# Patient Record
Sex: Female | Born: 1950 | Race: Black or African American | Hispanic: No | Smoking: Former smoker
Health system: Southern US, Community
[De-identification: ages and names within clinical notes are randomized; demographics above are authoritative.]

## PROBLEM LIST (undated history)

## (undated) DIAGNOSIS — I4729 Other ventricular tachycardia: Secondary | ICD-10-CM

## (undated) DIAGNOSIS — I10 Essential (primary) hypertension: Secondary | ICD-10-CM

## (undated) DIAGNOSIS — K7689 Other specified diseases of liver: Secondary | ICD-10-CM

## (undated) DIAGNOSIS — K219 Gastro-esophageal reflux disease without esophagitis: Secondary | ICD-10-CM

## (undated) DIAGNOSIS — J479 Bronchiectasis, uncomplicated: Secondary | ICD-10-CM

## (undated) DIAGNOSIS — Z923 Personal history of irradiation: Secondary | ICD-10-CM

## (undated) DIAGNOSIS — G459 Transient cerebral ischemic attack, unspecified: Secondary | ICD-10-CM

## (undated) DIAGNOSIS — Z95810 Presence of automatic (implantable) cardiac defibrillator: Secondary | ICD-10-CM

## (undated) DIAGNOSIS — G4733 Obstructive sleep apnea (adult) (pediatric): Secondary | ICD-10-CM

## (undated) DIAGNOSIS — C50919 Malignant neoplasm of unspecified site of unspecified female breast: Secondary | ICD-10-CM

## (undated) DIAGNOSIS — R112 Nausea with vomiting, unspecified: Secondary | ICD-10-CM

## (undated) DIAGNOSIS — D869 Sarcoidosis, unspecified: Secondary | ICD-10-CM

## (undated) DIAGNOSIS — J189 Pneumonia, unspecified organism: Secondary | ICD-10-CM

## (undated) DIAGNOSIS — Z950 Presence of cardiac pacemaker: Secondary | ICD-10-CM

## (undated) DIAGNOSIS — K222 Esophageal obstruction: Secondary | ICD-10-CM

## (undated) DIAGNOSIS — I5181 Takotsubo syndrome: Secondary | ICD-10-CM

## (undated) DIAGNOSIS — M2550 Pain in unspecified joint: Secondary | ICD-10-CM

## (undated) DIAGNOSIS — T7840XA Allergy, unspecified, initial encounter: Secondary | ICD-10-CM

## (undated) DIAGNOSIS — Z9889 Other specified postprocedural states: Secondary | ICD-10-CM

## (undated) DIAGNOSIS — K279 Peptic ulcer, site unspecified, unspecified as acute or chronic, without hemorrhage or perforation: Secondary | ICD-10-CM

## (undated) DIAGNOSIS — I509 Heart failure, unspecified: Secondary | ICD-10-CM

## (undated) DIAGNOSIS — M797 Fibromyalgia: Secondary | ICD-10-CM

## (undated) DIAGNOSIS — K648 Other hemorrhoids: Secondary | ICD-10-CM

## (undated) DIAGNOSIS — K5730 Diverticulosis of large intestine without perforation or abscess without bleeding: Secondary | ICD-10-CM

## (undated) DIAGNOSIS — I2699 Other pulmonary embolism without acute cor pulmonale: Secondary | ICD-10-CM

## (undated) DIAGNOSIS — K760 Fatty (change of) liver, not elsewhere classified: Secondary | ICD-10-CM

## (undated) DIAGNOSIS — A498 Other bacterial infections of unspecified site: Secondary | ICD-10-CM

## (undated) DIAGNOSIS — IMO0001 Reserved for inherently not codable concepts without codable children: Secondary | ICD-10-CM

## (undated) DIAGNOSIS — R0602 Shortness of breath: Secondary | ICD-10-CM

## (undated) DIAGNOSIS — R002 Palpitations: Secondary | ICD-10-CM

## (undated) DIAGNOSIS — I671 Cerebral aneurysm, nonruptured: Secondary | ICD-10-CM

## (undated) DIAGNOSIS — M549 Dorsalgia, unspecified: Secondary | ICD-10-CM

## (undated) DIAGNOSIS — K2970 Gastritis, unspecified, without bleeding: Secondary | ICD-10-CM

## (undated) DIAGNOSIS — F3289 Other specified depressive episodes: Secondary | ICD-10-CM

## (undated) DIAGNOSIS — F329 Major depressive disorder, single episode, unspecified: Secondary | ICD-10-CM

## (undated) DIAGNOSIS — M1990 Unspecified osteoarthritis, unspecified site: Secondary | ICD-10-CM

## (undated) DIAGNOSIS — E785 Hyperlipidemia, unspecified: Secondary | ICD-10-CM

## (undated) DIAGNOSIS — Q211 Atrial septal defect: Secondary | ICD-10-CM

## (undated) DIAGNOSIS — E039 Hypothyroidism, unspecified: Secondary | ICD-10-CM

## (undated) DIAGNOSIS — H409 Unspecified glaucoma: Secondary | ICD-10-CM

## (undated) DIAGNOSIS — Q2111 Secundum atrial septal defect: Secondary | ICD-10-CM

## (undated) DIAGNOSIS — E559 Vitamin D deficiency, unspecified: Secondary | ICD-10-CM

## (undated) DIAGNOSIS — I639 Cerebral infarction, unspecified: Secondary | ICD-10-CM

## (undated) DIAGNOSIS — K922 Gastrointestinal hemorrhage, unspecified: Secondary | ICD-10-CM

## (undated) DIAGNOSIS — I472 Ventricular tachycardia, unspecified: Secondary | ICD-10-CM

## (undated) DIAGNOSIS — G4730 Sleep apnea, unspecified: Secondary | ICD-10-CM

## (undated) DIAGNOSIS — F419 Anxiety disorder, unspecified: Secondary | ICD-10-CM

## (undated) DIAGNOSIS — IMO0002 Reserved for concepts with insufficient information to code with codable children: Secondary | ICD-10-CM

## (undated) DIAGNOSIS — D126 Benign neoplasm of colon, unspecified: Secondary | ICD-10-CM

## (undated) DIAGNOSIS — K449 Diaphragmatic hernia without obstruction or gangrene: Secondary | ICD-10-CM

## (undated) HISTORY — DX: Other bacterial infections of unspecified site: A49.8

## (undated) HISTORY — DX: Malignant neoplasm of unspecified site of unspecified female breast: C50.919

## (undated) HISTORY — DX: Palpitations: R00.2

## (undated) HISTORY — PX: ABDOMINAL HYSTERECTOMY: SHX81

## (undated) HISTORY — DX: Takotsubo syndrome: I51.81

## (undated) HISTORY — PX: CARDIAC DEFIBRILLATOR PLACEMENT: SHX171

## (undated) HISTORY — DX: Essential (primary) hypertension: I10

## (undated) HISTORY — DX: Transient cerebral ischemic attack, unspecified: G45.9

## (undated) HISTORY — DX: Obstructive sleep apnea (adult) (pediatric): G47.33

## (undated) HISTORY — DX: Other specified diseases of liver: K76.89

## (undated) HISTORY — DX: Reserved for inherently not codable concepts without codable children: IMO0001

## (undated) HISTORY — DX: Bronchiectasis, uncomplicated: J47.9

## (undated) HISTORY — DX: Esophageal obstruction: K22.2

## (undated) HISTORY — PX: JOINT REPLACEMENT: SHX530

## (undated) HISTORY — DX: Reserved for concepts with insufficient information to code with codable children: IMO0002

## (undated) HISTORY — DX: Other hemorrhoids: K64.8

## (undated) HISTORY — DX: Pain in unspecified joint: M25.50

## (undated) HISTORY — DX: Ventricular tachycardia, unspecified: I47.20

## (undated) HISTORY — DX: Shortness of breath: R06.02

## (undated) HISTORY — DX: Atrial septal defect: Q21.1

## (undated) HISTORY — DX: Peptic ulcer, site unspecified, unspecified as acute or chronic, without hemorrhage or perforation: K27.9

## (undated) HISTORY — DX: Anxiety disorder, unspecified: F41.9

## (undated) HISTORY — DX: Hyperlipidemia, unspecified: E78.5

## (undated) HISTORY — DX: Other ventricular tachycardia: I47.29

## (undated) HISTORY — DX: Cerebral aneurysm, nonruptured: I67.1

## (undated) HISTORY — DX: Gastro-esophageal reflux disease without esophagitis: K21.9

## (undated) HISTORY — DX: Diverticulosis of large intestine without perforation or abscess without bleeding: K57.30

## (undated) HISTORY — DX: Allergy, unspecified, initial encounter: T78.40XA

## (undated) HISTORY — PX: COLONOSCOPY: SHX174

## (undated) HISTORY — DX: Sarcoidosis, unspecified: D86.9

## (undated) HISTORY — DX: Gastrointestinal hemorrhage, unspecified: K92.2

## (undated) HISTORY — DX: Sleep apnea, unspecified: G47.30

## (undated) HISTORY — PX: EYE SURGERY: SHX253

## (undated) HISTORY — PX: UPPER GASTROINTESTINAL ENDOSCOPY: SHX188

## (undated) HISTORY — DX: Major depressive disorder, single episode, unspecified: F32.9

## (undated) HISTORY — DX: Other pulmonary embolism without acute cor pulmonale: I26.99

## (undated) HISTORY — PX: KNEE ARTHROSCOPY: SUR90

## (undated) HISTORY — PX: BREAST EXCISIONAL BIOPSY: SUR124

## (undated) HISTORY — PX: TUBAL LIGATION: SHX77

## (undated) HISTORY — DX: Vitamin D deficiency, unspecified: E55.9

## (undated) HISTORY — DX: Secundum atrial septal defect: Q21.11

## (undated) HISTORY — PX: PARTIAL HYSTERECTOMY: SHX80

## (undated) HISTORY — PX: BREAST LUMPECTOMY: SHX2

## (undated) HISTORY — DX: Unspecified osteoarthritis, unspecified site: M19.90

## (undated) HISTORY — DX: Diaphragmatic hernia without obstruction or gangrene: K44.9

## (undated) HISTORY — DX: Unspecified glaucoma: H40.9

## (undated) HISTORY — DX: Ventricular tachycardia: I47.2

## (undated) HISTORY — DX: Dorsalgia, unspecified: M54.9

## (undated) HISTORY — DX: Gastritis, unspecified, without bleeding: K29.70

## (undated) HISTORY — DX: Heart failure, unspecified: I50.9

## (undated) HISTORY — DX: Other specified depressive episodes: F32.89

## (undated) HISTORY — DX: Benign neoplasm of colon, unspecified: D12.6

## (undated) HISTORY — DX: Fatty (change of) liver, not elsewhere classified: K76.0

## (undated) HISTORY — PX: OTHER SURGICAL HISTORY: SHX169

## (undated) HISTORY — DX: Hypothyroidism, unspecified: E03.9

---

## 1998-04-11 ENCOUNTER — Ambulatory Visit (HOSPITAL_COMMUNITY): Admission: RE | Admit: 1998-04-11 | Discharge: 1998-04-11 | Payer: Self-pay | Admitting: *Deleted

## 1998-10-06 ENCOUNTER — Other Ambulatory Visit: Admission: RE | Admit: 1998-10-06 | Discharge: 1998-10-06 | Payer: Self-pay | Admitting: *Deleted

## 1998-11-16 ENCOUNTER — Encounter: Admission: RE | Admit: 1998-11-16 | Discharge: 1999-02-14 | Payer: Self-pay | Admitting: Radiation Oncology

## 1998-11-28 ENCOUNTER — Inpatient Hospital Stay (HOSPITAL_COMMUNITY): Admission: RE | Admit: 1998-11-28 | Discharge: 1998-12-01 | Payer: Self-pay | Admitting: *Deleted

## 1999-01-23 ENCOUNTER — Encounter: Payer: Self-pay | Admitting: Neurosurgery

## 1999-01-23 ENCOUNTER — Ambulatory Visit (HOSPITAL_COMMUNITY): Admission: RE | Admit: 1999-01-23 | Discharge: 1999-01-23 | Payer: Self-pay | Admitting: Neurosurgery

## 1999-02-18 HISTORY — PX: CEREBRAL ANEURYSM REPAIR: SHX164

## 1999-02-28 ENCOUNTER — Inpatient Hospital Stay (HOSPITAL_COMMUNITY): Admission: RE | Admit: 1999-02-28 | Discharge: 1999-03-05 | Payer: Self-pay | Admitting: Neurosurgery

## 1999-02-28 ENCOUNTER — Encounter: Payer: Self-pay | Admitting: Neurosurgery

## 1999-03-02 ENCOUNTER — Encounter: Payer: Self-pay | Admitting: Neurosurgery

## 1999-03-30 ENCOUNTER — Emergency Department (HOSPITAL_COMMUNITY): Admission: EM | Admit: 1999-03-30 | Discharge: 1999-03-30 | Payer: Self-pay | Admitting: Emergency Medicine

## 1999-08-30 ENCOUNTER — Encounter: Admission: RE | Admit: 1999-08-30 | Discharge: 1999-08-30 | Payer: Self-pay | Admitting: *Deleted

## 1999-10-02 ENCOUNTER — Encounter: Payer: Self-pay | Admitting: Neurosurgery

## 1999-10-02 ENCOUNTER — Encounter: Admission: RE | Admit: 1999-10-02 | Discharge: 1999-10-02 | Payer: Self-pay | Admitting: Neurosurgery

## 1999-12-05 ENCOUNTER — Ambulatory Visit (HOSPITAL_BASED_OUTPATIENT_CLINIC_OR_DEPARTMENT_OTHER): Admission: RE | Admit: 1999-12-05 | Discharge: 1999-12-05 | Payer: Self-pay | Admitting: General Surgery

## 2000-06-17 ENCOUNTER — Encounter: Payer: Self-pay | Admitting: Family Medicine

## 2000-06-17 ENCOUNTER — Encounter: Admission: RE | Admit: 2000-06-17 | Discharge: 2000-06-17 | Payer: Self-pay | Admitting: Family Medicine

## 2000-07-29 ENCOUNTER — Ambulatory Visit (HOSPITAL_COMMUNITY): Admission: RE | Admit: 2000-07-29 | Discharge: 2000-07-29 | Payer: Self-pay | Admitting: Neurology

## 2000-07-29 ENCOUNTER — Encounter: Payer: Self-pay | Admitting: Neurology

## 2000-08-30 ENCOUNTER — Encounter: Payer: Self-pay | Admitting: Orthopedic Surgery

## 2000-08-30 ENCOUNTER — Encounter: Admission: RE | Admit: 2000-08-30 | Discharge: 2000-08-30 | Payer: Self-pay | Admitting: Orthopedic Surgery

## 2000-11-22 ENCOUNTER — Ambulatory Visit (HOSPITAL_COMMUNITY): Admission: RE | Admit: 2000-11-22 | Discharge: 2000-11-22 | Payer: Self-pay | Admitting: Family Medicine

## 2000-12-04 ENCOUNTER — Encounter: Payer: Self-pay | Admitting: Family Medicine

## 2000-12-04 ENCOUNTER — Encounter: Admission: RE | Admit: 2000-12-04 | Discharge: 2000-12-04 | Payer: Self-pay | Admitting: Family Medicine

## 2001-08-20 ENCOUNTER — Encounter: Payer: Self-pay | Admitting: Family Medicine

## 2001-11-03 ENCOUNTER — Encounter: Admission: RE | Admit: 2001-11-03 | Discharge: 2001-11-03 | Payer: Self-pay | Admitting: Neurosurgery

## 2001-11-03 ENCOUNTER — Encounter: Payer: Self-pay | Admitting: Neurosurgery

## 2001-12-24 ENCOUNTER — Ambulatory Visit (HOSPITAL_COMMUNITY): Admission: RE | Admit: 2001-12-24 | Discharge: 2001-12-24 | Payer: Self-pay | Admitting: Gastroenterology

## 2001-12-24 ENCOUNTER — Encounter: Payer: Self-pay | Admitting: Gastroenterology

## 2002-02-26 ENCOUNTER — Other Ambulatory Visit: Admission: RE | Admit: 2002-02-26 | Discharge: 2002-02-26 | Payer: Self-pay | Admitting: Obstetrics and Gynecology

## 2002-05-18 ENCOUNTER — Encounter: Payer: Self-pay | Admitting: Neurology

## 2002-05-18 ENCOUNTER — Encounter: Admission: RE | Admit: 2002-05-18 | Discharge: 2002-05-18 | Payer: Self-pay | Admitting: Neurology

## 2002-08-20 DIAGNOSIS — K922 Gastrointestinal hemorrhage, unspecified: Secondary | ICD-10-CM

## 2002-08-20 HISTORY — PX: OTHER SURGICAL HISTORY: SHX169

## 2002-08-20 HISTORY — DX: Gastrointestinal hemorrhage, unspecified: K92.2

## 2002-11-03 ENCOUNTER — Ambulatory Visit (HOSPITAL_COMMUNITY): Admission: RE | Admit: 2002-11-03 | Discharge: 2002-11-03 | Payer: Self-pay | Admitting: Neurosurgery

## 2003-03-30 ENCOUNTER — Inpatient Hospital Stay (HOSPITAL_COMMUNITY): Admission: EM | Admit: 2003-03-30 | Discharge: 2003-03-31 | Payer: Self-pay | Admitting: Emergency Medicine

## 2003-04-23 ENCOUNTER — Encounter: Admission: RE | Admit: 2003-04-23 | Discharge: 2003-04-23 | Payer: Self-pay | Admitting: *Deleted

## 2003-06-01 ENCOUNTER — Encounter: Payer: Self-pay | Admitting: Gastroenterology

## 2003-06-01 ENCOUNTER — Encounter: Admission: RE | Admit: 2003-06-01 | Discharge: 2003-06-01 | Payer: Self-pay | Admitting: Gastroenterology

## 2003-06-21 ENCOUNTER — Encounter: Admission: RE | Admit: 2003-06-21 | Discharge: 2003-06-21 | Payer: Self-pay | Admitting: Neurosurgery

## 2003-11-20 ENCOUNTER — Inpatient Hospital Stay (HOSPITAL_COMMUNITY): Admission: EM | Admit: 2003-11-20 | Discharge: 2003-11-22 | Payer: Self-pay | Admitting: Emergency Medicine

## 2003-11-22 ENCOUNTER — Encounter (INDEPENDENT_AMBULATORY_CARE_PROVIDER_SITE_OTHER): Payer: Self-pay | Admitting: Cardiology

## 2003-11-22 ENCOUNTER — Encounter: Payer: Self-pay | Admitting: Family Medicine

## 2003-11-26 ENCOUNTER — Encounter: Admission: RE | Admit: 2003-11-26 | Discharge: 2003-11-26 | Payer: Self-pay | Admitting: Family Medicine

## 2004-01-14 ENCOUNTER — Inpatient Hospital Stay (HOSPITAL_COMMUNITY): Admission: EM | Admit: 2004-01-14 | Discharge: 2004-01-22 | Payer: Self-pay | Admitting: *Deleted

## 2004-01-17 ENCOUNTER — Encounter: Payer: Self-pay | Admitting: Cardiology

## 2004-01-22 ENCOUNTER — Encounter: Payer: Self-pay | Admitting: Family Medicine

## 2004-02-22 ENCOUNTER — Ambulatory Visit (HOSPITAL_COMMUNITY): Admission: RE | Admit: 2004-02-22 | Discharge: 2004-02-22 | Payer: Self-pay | Admitting: Cardiology

## 2004-04-28 ENCOUNTER — Encounter: Payer: Self-pay | Admitting: Pulmonary Disease

## 2004-05-11 ENCOUNTER — Ambulatory Visit (HOSPITAL_COMMUNITY): Admission: RE | Admit: 2004-05-11 | Discharge: 2004-05-11 | Payer: Self-pay | Admitting: Pulmonary Disease

## 2004-05-16 ENCOUNTER — Encounter: Admission: RE | Admit: 2004-05-16 | Discharge: 2004-05-16 | Payer: Self-pay | Admitting: *Deleted

## 2004-06-27 ENCOUNTER — Ambulatory Visit: Payer: Self-pay | Admitting: Cardiology

## 2004-07-05 ENCOUNTER — Ambulatory Visit: Payer: Self-pay | Admitting: Family Medicine

## 2004-07-19 ENCOUNTER — Ambulatory Visit: Payer: Self-pay | Admitting: Internal Medicine

## 2004-08-01 ENCOUNTER — Ambulatory Visit: Payer: Self-pay | Admitting: Family Medicine

## 2004-08-04 ENCOUNTER — Ambulatory Visit: Payer: Self-pay | Admitting: Cardiology

## 2004-08-07 ENCOUNTER — Ambulatory Visit: Payer: Self-pay | Admitting: Cardiology

## 2004-08-20 HISTORY — PX: OTHER SURGICAL HISTORY: SHX169

## 2004-09-27 ENCOUNTER — Ambulatory Visit: Payer: Self-pay | Admitting: Internal Medicine

## 2004-10-06 ENCOUNTER — Ambulatory Visit: Payer: Self-pay | Admitting: Internal Medicine

## 2004-10-23 ENCOUNTER — Ambulatory Visit: Payer: Self-pay | Admitting: Internal Medicine

## 2004-10-23 ENCOUNTER — Ambulatory Visit: Payer: Self-pay | Admitting: Cardiology

## 2004-11-06 ENCOUNTER — Ambulatory Visit: Payer: Self-pay | Admitting: Pulmonary Disease

## 2004-11-08 ENCOUNTER — Ambulatory Visit (HOSPITAL_COMMUNITY): Admission: RE | Admit: 2004-11-08 | Discharge: 2004-11-08 | Payer: Self-pay | Admitting: Pulmonary Disease

## 2004-11-16 ENCOUNTER — Ambulatory Visit: Payer: Self-pay | Admitting: Pulmonary Disease

## 2004-11-16 ENCOUNTER — Ambulatory Visit: Admission: RE | Admit: 2004-11-16 | Discharge: 2004-11-16 | Payer: Self-pay | Admitting: Pulmonary Disease

## 2004-11-20 ENCOUNTER — Ambulatory Visit: Payer: Self-pay | Admitting: Cardiology

## 2004-11-27 ENCOUNTER — Ambulatory Visit: Payer: Self-pay | Admitting: Cardiology

## 2004-12-08 ENCOUNTER — Ambulatory Visit: Payer: Self-pay

## 2004-12-11 ENCOUNTER — Ambulatory Visit: Payer: Self-pay | Admitting: Family Medicine

## 2004-12-18 ENCOUNTER — Ambulatory Visit: Payer: Self-pay | Admitting: Cardiology

## 2004-12-19 ENCOUNTER — Ambulatory Visit: Payer: Self-pay | Admitting: Family Medicine

## 2004-12-28 ENCOUNTER — Ambulatory Visit: Payer: Self-pay | Admitting: Internal Medicine

## 2004-12-28 ENCOUNTER — Ambulatory Visit (HOSPITAL_COMMUNITY): Admission: RE | Admit: 2004-12-28 | Discharge: 2004-12-28 | Payer: Self-pay | Admitting: Cardiology

## 2005-01-01 ENCOUNTER — Ambulatory Visit: Payer: Self-pay | Admitting: Internal Medicine

## 2005-01-01 ENCOUNTER — Ambulatory Visit: Payer: Self-pay | Admitting: Cardiology

## 2005-01-11 ENCOUNTER — Ambulatory Visit: Payer: Self-pay | Admitting: Family Medicine

## 2005-01-17 ENCOUNTER — Ambulatory Visit: Payer: Self-pay | Admitting: Family Medicine

## 2005-01-22 ENCOUNTER — Ambulatory Visit: Payer: Self-pay | Admitting: Cardiology

## 2005-01-22 ENCOUNTER — Ambulatory Visit: Payer: Self-pay | Admitting: Internal Medicine

## 2005-02-05 ENCOUNTER — Encounter: Admission: RE | Admit: 2005-02-05 | Discharge: 2005-02-05 | Payer: Self-pay | Admitting: Gastroenterology

## 2005-02-05 ENCOUNTER — Ambulatory Visit: Payer: Self-pay | Admitting: Cardiology

## 2005-02-23 ENCOUNTER — Ambulatory Visit: Payer: Self-pay | Admitting: Cardiology

## 2005-03-06 ENCOUNTER — Ambulatory Visit: Payer: Self-pay | Admitting: Family Medicine

## 2005-03-26 ENCOUNTER — Ambulatory Visit: Payer: Self-pay | Admitting: Family Medicine

## 2005-03-26 ENCOUNTER — Ambulatory Visit: Payer: Self-pay | Admitting: Cardiology

## 2005-04-13 ENCOUNTER — Ambulatory Visit: Payer: Self-pay | Admitting: Internal Medicine

## 2005-04-13 ENCOUNTER — Ambulatory Visit: Payer: Self-pay | Admitting: Cardiology

## 2005-05-04 ENCOUNTER — Ambulatory Visit: Payer: Self-pay | Admitting: Cardiology

## 2005-05-11 ENCOUNTER — Ambulatory Visit: Payer: Self-pay | Admitting: Licensed Clinical Social Worker

## 2005-05-16 ENCOUNTER — Ambulatory Visit: Payer: Self-pay | Admitting: Licensed Clinical Social Worker

## 2005-05-24 ENCOUNTER — Ambulatory Visit: Payer: Self-pay | Admitting: Licensed Clinical Social Worker

## 2005-05-29 ENCOUNTER — Encounter: Admission: RE | Admit: 2005-05-29 | Discharge: 2005-05-29 | Payer: Self-pay | Admitting: *Deleted

## 2005-05-31 ENCOUNTER — Ambulatory Visit: Payer: Self-pay | Admitting: Licensed Clinical Social Worker

## 2005-06-01 ENCOUNTER — Ambulatory Visit: Payer: Self-pay | Admitting: Internal Medicine

## 2005-06-07 ENCOUNTER — Ambulatory Visit: Payer: Self-pay | Admitting: Licensed Clinical Social Worker

## 2005-06-14 ENCOUNTER — Ambulatory Visit: Payer: Self-pay | Admitting: Licensed Clinical Social Worker

## 2005-06-21 ENCOUNTER — Ambulatory Visit: Payer: Self-pay | Admitting: Family Medicine

## 2005-06-21 ENCOUNTER — Ambulatory Visit: Payer: Self-pay | Admitting: Licensed Clinical Social Worker

## 2005-06-28 ENCOUNTER — Ambulatory Visit: Payer: Self-pay | Admitting: Cardiology

## 2005-07-03 ENCOUNTER — Ambulatory Visit: Payer: Self-pay | Admitting: Family Medicine

## 2005-07-17 ENCOUNTER — Ambulatory Visit: Payer: Self-pay

## 2005-07-17 ENCOUNTER — Ambulatory Visit: Payer: Self-pay | Admitting: *Deleted

## 2005-07-18 ENCOUNTER — Ambulatory Visit: Payer: Self-pay | Admitting: Family Medicine

## 2005-07-24 ENCOUNTER — Ambulatory Visit: Payer: Self-pay | Admitting: Family Medicine

## 2005-07-26 ENCOUNTER — Ambulatory Visit: Payer: Self-pay | Admitting: Cardiology

## 2005-08-07 ENCOUNTER — Ambulatory Visit: Payer: Self-pay | Admitting: Cardiovascular Disease

## 2005-08-28 ENCOUNTER — Ambulatory Visit: Payer: Self-pay | Admitting: Cardiology

## 2005-09-12 ENCOUNTER — Ambulatory Visit: Payer: Self-pay | Admitting: *Deleted

## 2005-09-27 ENCOUNTER — Ambulatory Visit: Payer: Self-pay | Admitting: Cardiology

## 2005-09-28 ENCOUNTER — Ambulatory Visit: Payer: Self-pay | Admitting: Internal Medicine

## 2005-10-02 ENCOUNTER — Ambulatory Visit: Payer: Self-pay | Admitting: *Deleted

## 2005-10-12 ENCOUNTER — Ambulatory Visit: Payer: Self-pay | Admitting: Family Medicine

## 2005-10-15 ENCOUNTER — Encounter: Admission: RE | Admit: 2005-10-15 | Discharge: 2005-10-15 | Payer: Self-pay | Admitting: Family Medicine

## 2005-10-29 ENCOUNTER — Ambulatory Visit: Payer: Self-pay

## 2005-11-02 ENCOUNTER — Ambulatory Visit: Payer: Self-pay | Admitting: Family Medicine

## 2005-11-20 ENCOUNTER — Ambulatory Visit: Payer: Self-pay

## 2005-11-27 ENCOUNTER — Ambulatory Visit: Payer: Self-pay | Admitting: Cardiology

## 2005-12-18 ENCOUNTER — Ambulatory Visit: Payer: Self-pay | Admitting: Family Medicine

## 2005-12-27 ENCOUNTER — Ambulatory Visit: Payer: Self-pay | Admitting: Cardiology

## 2006-01-24 ENCOUNTER — Ambulatory Visit: Payer: Self-pay | Admitting: Internal Medicine

## 2006-02-27 ENCOUNTER — Ambulatory Visit: Payer: Self-pay | Admitting: Internal Medicine

## 2006-02-28 ENCOUNTER — Ambulatory Visit: Payer: Self-pay | Admitting: Internal Medicine

## 2006-03-12 ENCOUNTER — Ambulatory Visit: Payer: Self-pay | Admitting: Family Medicine

## 2006-03-12 ENCOUNTER — Ambulatory Visit: Payer: Self-pay | Admitting: Oncology

## 2006-03-27 ENCOUNTER — Ambulatory Visit: Payer: Self-pay | Admitting: Cardiology

## 2006-03-27 LAB — CBC WITH DIFFERENTIAL (CANCER CENTER ONLY)
BASO#: 0 10*3/uL (ref 0.0–0.2)
Eosinophils Absolute: 0.2 10*3/uL (ref 0.0–0.5)
HCT: 37 % (ref 34.8–46.6)
HGB: 12.1 g/dL (ref 11.6–15.9)
LYMPH#: 0.8 10*3/uL — ABNORMAL LOW (ref 0.9–3.3)
MCH: 27.9 pg (ref 26.0–34.0)
NEUT#: 1.7 10*3/uL (ref 1.5–6.5)
NEUT%: 57.8 % (ref 39.6–80.0)
RBC: 4.33 10*6/uL (ref 3.70–5.32)

## 2006-04-02 LAB — HYPERCOAGULABLE PANEL, COMPREHENSIVE
AntiThromb III Func: 118 % (ref 75–120)
Anticardiolipin IgA: <7 [APL'U] (ref <13)
Beta-2-Glycoprotein I IgA: <4 U/mL (ref <10)
DRVVT 1:1 Mix: 38.7 secs (ref 31.9–44.2)
DRVVT: 62.9 secs — ABNORMAL HIGH (ref 31.9–44.2)
Homocysteine: 8.1 umol/L (ref 4.0–15.4)
PTTLA Confirmation: 0 secs (ref <8.0)
Protein C Activity: <15 % (ref 91–147)
Protein S Activity: 26 % — ABNORMAL LOW (ref 81–180)

## 2006-04-02 LAB — COMPREHENSIVE METABOLIC PANEL
ALT: 34 U/L (ref 0–40)
AST: 34 U/L (ref 0–37)
Albumin: 3.9 g/dL (ref 3.5–5.2)
Alkaline Phosphatase: 107 U/L (ref 39–117)
Potassium: 3.8 mEq/L (ref 3.5–5.3)
Sodium: 140 mEq/L (ref 135–145)
Total Bilirubin: 0.5 mg/dL (ref 0.3–1.2)
Total Protein: 7.6 g/dL (ref 6.0–8.3)

## 2006-04-02 LAB — RETICULOCYTES (CHCC)
ABS Retic: 56 10*3/uL (ref 19.0–186.0)
RBC.: 4.31 MIL/uL (ref 3.87–5.11)

## 2006-04-30 ENCOUNTER — Ambulatory Visit: Payer: Self-pay | Admitting: Oncology

## 2006-05-02 ENCOUNTER — Ambulatory Visit: Payer: Self-pay | Admitting: Cardiology

## 2006-05-02 LAB — CBC WITH DIFFERENTIAL (CANCER CENTER ONLY)
BASO#: 0 10*3/uL (ref 0.0–0.2)
Eosinophils Absolute: 0.1 10*3/uL (ref 0.0–0.5)
HGB: 11.9 g/dL (ref 11.6–15.9)
LYMPH%: 26.4 % (ref 14.0–48.0)
MCH: 27.9 pg (ref 26.0–34.0)
MCV: 86 fL (ref 81–101)
MONO%: 9.7 % (ref 0.0–13.0)
NEUT%: 60.1 % (ref 39.6–80.0)
RBC: 4.27 10*6/uL (ref 3.70–5.32)

## 2006-05-06 ENCOUNTER — Ambulatory Visit: Payer: Self-pay | Admitting: Cardiology

## 2006-05-09 ENCOUNTER — Encounter: Payer: Self-pay | Admitting: Pulmonary Disease

## 2006-05-09 ENCOUNTER — Ambulatory Visit: Payer: Self-pay | Admitting: Cardiology

## 2006-05-14 ENCOUNTER — Ambulatory Visit: Payer: Self-pay | Admitting: Pulmonary Disease

## 2006-05-14 ENCOUNTER — Encounter: Payer: Self-pay | Admitting: Cardiovascular Disease

## 2006-05-14 ENCOUNTER — Ambulatory Visit: Payer: Self-pay

## 2006-05-23 ENCOUNTER — Ambulatory Visit: Payer: Self-pay | Admitting: Cardiology

## 2006-05-28 ENCOUNTER — Ambulatory Visit: Payer: Self-pay | Admitting: Family Medicine

## 2006-05-30 ENCOUNTER — Ambulatory Visit: Payer: Self-pay | Admitting: Cardiology

## 2006-06-20 ENCOUNTER — Encounter: Admission: RE | Admit: 2006-06-20 | Discharge: 2006-06-20 | Payer: Self-pay | Admitting: *Deleted

## 2006-06-20 ENCOUNTER — Ambulatory Visit: Payer: Self-pay | Admitting: *Deleted

## 2006-07-02 ENCOUNTER — Ambulatory Visit: Payer: Self-pay | Admitting: Family Medicine

## 2006-07-08 ENCOUNTER — Ambulatory Visit: Payer: Self-pay | Admitting: Family Medicine

## 2006-08-02 ENCOUNTER — Ambulatory Visit: Payer: Self-pay | Admitting: Internal Medicine

## 2006-08-07 ENCOUNTER — Ambulatory Visit: Payer: Self-pay | Admitting: *Deleted

## 2006-08-15 ENCOUNTER — Ambulatory Visit: Payer: Self-pay | Admitting: Family Medicine

## 2006-09-03 ENCOUNTER — Ambulatory Visit: Payer: Self-pay | Admitting: Family Medicine

## 2006-09-03 LAB — CONVERTED CEMR LAB
ALT: 38 units/L (ref 0–40)
Alkaline Phosphatase: 126 units/L — ABNORMAL HIGH (ref 39–117)
Basophils Absolute: 0 10*3/uL (ref 0.0–0.1)
Basophils Relative: 0.6 % (ref 0.0–1.0)
Lymphocytes Relative: 23.9 % (ref 12.0–46.0)
Monocytes Relative: 10.1 % (ref 3.0–11.0)
Neutro Abs: 1.6 10*3/uL (ref 1.4–7.7)
Platelets: 205 10*3/uL (ref 150–400)
Total Protein: 6.9 g/dL (ref 6.0–8.3)

## 2006-09-04 ENCOUNTER — Ambulatory Visit: Payer: Self-pay | Admitting: Cardiology

## 2006-09-11 ENCOUNTER — Ambulatory Visit: Payer: Self-pay | Admitting: Oncology

## 2006-09-12 ENCOUNTER — Ambulatory Visit: Payer: Self-pay | Admitting: Internal Medicine

## 2006-09-12 LAB — CBC WITH DIFFERENTIAL (CANCER CENTER ONLY)
Eosinophils Absolute: 0.2 10*3/uL (ref 0.0–0.5)
LYMPH%: 27.5 % (ref 14.0–48.0)
MCH: 28.5 pg (ref 26.0–34.0)
MCHC: 33.1 g/dL (ref 32.0–36.0)
MCV: 86 fL (ref 81–101)
MONO%: 8.7 % (ref 0.0–13.0)
Platelets: 190 10*3/uL (ref 145–400)
RDW: 12.6 % (ref 10.5–14.6)

## 2006-09-24 ENCOUNTER — Ambulatory Visit: Payer: Self-pay | Admitting: Cardiology

## 2006-10-02 ENCOUNTER — Ambulatory Visit: Payer: Self-pay | Admitting: *Deleted

## 2006-10-30 ENCOUNTER — Ambulatory Visit: Payer: Self-pay | Admitting: Cardiology

## 2006-11-18 ENCOUNTER — Encounter: Payer: Self-pay | Admitting: Family Medicine

## 2006-11-18 DIAGNOSIS — E785 Hyperlipidemia, unspecified: Secondary | ICD-10-CM

## 2006-11-18 DIAGNOSIS — K219 Gastro-esophageal reflux disease without esophagitis: Secondary | ICD-10-CM | POA: Insufficient documentation

## 2006-11-18 DIAGNOSIS — I10 Essential (primary) hypertension: Secondary | ICD-10-CM | POA: Insufficient documentation

## 2006-11-18 DIAGNOSIS — K760 Fatty (change of) liver, not elsewhere classified: Secondary | ICD-10-CM

## 2006-11-18 DIAGNOSIS — I2699 Other pulmonary embolism without acute cor pulmonale: Secondary | ICD-10-CM | POA: Insufficient documentation

## 2006-11-18 DIAGNOSIS — M797 Fibromyalgia: Secondary | ICD-10-CM

## 2006-11-18 DIAGNOSIS — F418 Other specified anxiety disorders: Secondary | ICD-10-CM

## 2006-11-18 DIAGNOSIS — E039 Hypothyroidism, unspecified: Secondary | ICD-10-CM

## 2006-11-18 DIAGNOSIS — J309 Allergic rhinitis, unspecified: Secondary | ICD-10-CM

## 2006-11-18 DIAGNOSIS — I671 Cerebral aneurysm, nonruptured: Secondary | ICD-10-CM | POA: Insufficient documentation

## 2006-11-18 DIAGNOSIS — K5730 Diverticulosis of large intestine without perforation or abscess without bleeding: Secondary | ICD-10-CM

## 2006-11-18 DIAGNOSIS — I059 Rheumatic mitral valve disease, unspecified: Secondary | ICD-10-CM | POA: Insufficient documentation

## 2006-11-27 ENCOUNTER — Ambulatory Visit: Payer: Self-pay | Admitting: Internal Medicine

## 2006-12-12 ENCOUNTER — Ambulatory Visit: Payer: Self-pay | Admitting: Internal Medicine

## 2006-12-12 ENCOUNTER — Ambulatory Visit: Payer: Self-pay | Admitting: Family Medicine

## 2006-12-25 ENCOUNTER — Ambulatory Visit: Payer: Self-pay | Admitting: *Deleted

## 2006-12-30 ENCOUNTER — Ambulatory Visit: Payer: Self-pay | Admitting: Cardiology

## 2007-01-07 ENCOUNTER — Encounter: Payer: Self-pay | Admitting: Family Medicine

## 2007-01-16 ENCOUNTER — Ambulatory Visit: Payer: Self-pay | Admitting: Internal Medicine

## 2007-01-27 ENCOUNTER — Ambulatory Visit: Payer: Self-pay | Admitting: Internal Medicine

## 2007-01-27 ENCOUNTER — Ambulatory Visit: Payer: Self-pay | Admitting: Cardiology

## 2007-01-27 LAB — CONVERTED CEMR LAB
BUN: 10 mg/dL (ref 6–23)
CO2: 29 meq/L (ref 19–32)
Calcium: 9.1 mg/dL (ref 8.4–10.5)
Chloride: 110 meq/L (ref 96–112)
Creatinine, Ser: 0.8 mg/dL (ref 0.4–1.2)

## 2007-02-26 ENCOUNTER — Ambulatory Visit: Payer: Self-pay | Admitting: Cardiology

## 2007-02-26 LAB — CONVERTED CEMR LAB
CO2: 30 meq/L (ref 19–32)
Calcium: 9.5 mg/dL (ref 8.4–10.5)
Chloride: 106 meq/L (ref 96–112)
Glucose, Bld: 115 mg/dL — ABNORMAL HIGH (ref 70–99)

## 2007-03-11 ENCOUNTER — Ambulatory Visit: Payer: Self-pay | Admitting: Oncology

## 2007-03-13 ENCOUNTER — Encounter: Payer: Self-pay | Admitting: Family Medicine

## 2007-03-13 LAB — CBC WITH DIFFERENTIAL (CANCER CENTER ONLY)
BASO#: 0 10*3/uL (ref 0.0–0.2)
BASO%: 0.4 % (ref 0.0–2.0)
EOS%: 3.8 % (ref 0.0–7.0)
Eosinophils Absolute: 0.1 10*3/uL (ref 0.0–0.5)
HCT: 35.1 % (ref 34.8–46.6)
HGB: 11.7 g/dL (ref 11.6–15.9)
LYMPH#: 1 10*3/uL (ref 0.9–3.3)
LYMPH%: 29 % (ref 14.0–48.0)
MCH: 28.4 pg (ref 26.0–34.0)
MCHC: 33.3 g/dL (ref 32.0–36.0)
MCV: 85 fL (ref 81–101)
MONO#: 0.2 10*3/uL (ref 0.1–0.9)
MONO%: 7.1 % (ref 0.0–13.0)
NEUT#: 2 10*3/uL (ref 1.5–6.5)
NEUT%: 59.7 % (ref 39.6–80.0)
Platelets: 199 10*3/uL (ref 145–400)
RBC: 4.12 10*6/uL (ref 3.70–5.32)
RDW: 12.7 % (ref 10.5–14.6)
WBC: 3.3 10*3/uL — ABNORMAL LOW (ref 3.9–10.0)

## 2007-04-01 ENCOUNTER — Ambulatory Visit: Payer: Self-pay | Admitting: Cardiology

## 2007-04-18 ENCOUNTER — Ambulatory Visit: Payer: Self-pay | Admitting: Family Medicine

## 2007-04-24 ENCOUNTER — Ambulatory Visit: Payer: Self-pay | Admitting: Cardiology

## 2007-05-08 ENCOUNTER — Ambulatory Visit: Payer: Self-pay | Admitting: Cardiology

## 2007-05-12 ENCOUNTER — Encounter: Payer: Self-pay | Admitting: Family Medicine

## 2007-05-13 ENCOUNTER — Encounter: Payer: Self-pay | Admitting: Family Medicine

## 2007-05-15 ENCOUNTER — Ambulatory Visit: Payer: Self-pay | Admitting: Internal Medicine

## 2007-05-22 ENCOUNTER — Ambulatory Visit: Payer: Self-pay | Admitting: Cardiology

## 2007-06-04 ENCOUNTER — Telehealth: Payer: Self-pay | Admitting: Family Medicine

## 2007-06-12 ENCOUNTER — Encounter: Admission: RE | Admit: 2007-06-12 | Discharge: 2007-06-12 | Payer: Self-pay | Admitting: Gastroenterology

## 2007-06-12 ENCOUNTER — Ambulatory Visit: Payer: Self-pay | Admitting: Cardiology

## 2007-06-25 ENCOUNTER — Ambulatory Visit: Payer: Self-pay | Admitting: Cardiology

## 2007-06-26 ENCOUNTER — Ambulatory Visit: Payer: Self-pay

## 2007-06-27 ENCOUNTER — Encounter: Admission: RE | Admit: 2007-06-27 | Discharge: 2007-06-27 | Payer: Self-pay | Admitting: Family Medicine

## 2007-07-01 ENCOUNTER — Encounter (INDEPENDENT_AMBULATORY_CARE_PROVIDER_SITE_OTHER): Payer: Self-pay | Admitting: *Deleted

## 2007-07-03 ENCOUNTER — Ambulatory Visit: Payer: Self-pay | Admitting: Cardiology

## 2007-07-04 ENCOUNTER — Ambulatory Visit: Payer: Self-pay | Admitting: Family Medicine

## 2007-07-09 LAB — CONVERTED CEMR LAB
ALT: 38 units/L — ABNORMAL HIGH (ref 0–35)
Albumin: 4 g/dL (ref 3.5–5.2)
BUN: 11 mg/dL (ref 6–23)
CO2: 30 meq/L (ref 19–32)
GFR calc non Af Amer: 78 mL/min
Phosphorus: 3.8 mg/dL (ref 2.3–4.6)
Potassium: 3.9 meq/L (ref 3.5–5.1)
Sodium: 139 meq/L (ref 135–145)
Total CHOL/HDL Ratio: 4.7
Triglycerides: 127 mg/dL (ref 0–149)

## 2007-07-24 ENCOUNTER — Ambulatory Visit: Payer: Self-pay | Admitting: Cardiovascular Disease

## 2007-08-01 ENCOUNTER — Encounter: Payer: Self-pay | Admitting: Family Medicine

## 2007-08-11 ENCOUNTER — Ambulatory Visit: Payer: Self-pay | Admitting: Cardiology

## 2007-08-14 ENCOUNTER — Ambulatory Visit: Payer: Self-pay | Admitting: Internal Medicine

## 2007-09-12 ENCOUNTER — Telehealth (INDEPENDENT_AMBULATORY_CARE_PROVIDER_SITE_OTHER): Payer: Self-pay | Admitting: *Deleted

## 2007-09-16 ENCOUNTER — Ambulatory Visit: Payer: Self-pay | Admitting: Cardiovascular Disease

## 2007-10-08 ENCOUNTER — Encounter: Payer: Self-pay | Admitting: Family Medicine

## 2007-10-09 ENCOUNTER — Ambulatory Visit: Payer: Self-pay | Admitting: Family Medicine

## 2007-10-10 ENCOUNTER — Encounter: Payer: Self-pay | Admitting: Family Medicine

## 2007-10-13 LAB — CONVERTED CEMR LAB
AST: 36 units/L (ref 0–37)
Cholesterol: 153 mg/dL (ref 0–200)
LDL Cholesterol: 91 mg/dL (ref 0–99)
Total CHOL/HDL Ratio: 3.1
Triglycerides: 68 mg/dL (ref 0–149)

## 2007-10-14 ENCOUNTER — Ambulatory Visit: Payer: Self-pay | Admitting: Cardiology

## 2007-10-17 ENCOUNTER — Ambulatory Visit: Payer: Self-pay | Admitting: Family Medicine

## 2007-10-17 LAB — CONVERTED CEMR LAB
Glucose, Urine, Semiquant: NEGATIVE
Specific Gravity, Urine: 1.01
WBC Urine, dipstick: NEGATIVE
pH: 5.5

## 2007-10-24 ENCOUNTER — Ambulatory Visit: Payer: Self-pay | Admitting: Cardiology

## 2007-11-11 ENCOUNTER — Ambulatory Visit: Payer: Self-pay | Admitting: Cardiovascular Disease

## 2007-11-13 ENCOUNTER — Ambulatory Visit: Payer: Self-pay | Admitting: Internal Medicine

## 2007-11-25 ENCOUNTER — Telehealth (INDEPENDENT_AMBULATORY_CARE_PROVIDER_SITE_OTHER): Payer: Self-pay | Admitting: *Deleted

## 2007-11-25 ENCOUNTER — Ambulatory Visit: Payer: Self-pay | Admitting: Family Medicine

## 2007-11-26 ENCOUNTER — Encounter: Payer: Self-pay | Admitting: Family Medicine

## 2007-12-01 LAB — CONVERTED CEMR LAB
Calcium: 9.4 mg/dL (ref 8.4–10.5)
GFR calc Af Amer: 94 mL/min
Sodium: 139 meq/L (ref 135–145)

## 2007-12-02 LAB — CONVERTED CEMR LAB: Vit D, 1,25-Dihydroxy: 42 (ref 30–89)

## 2007-12-03 ENCOUNTER — Ambulatory Visit: Payer: Self-pay | Admitting: Cardiology

## 2007-12-04 ENCOUNTER — Encounter: Payer: Self-pay | Admitting: Family Medicine

## 2007-12-12 ENCOUNTER — Ambulatory Visit: Payer: Self-pay | Admitting: Cardiology

## 2007-12-31 ENCOUNTER — Ambulatory Visit: Payer: Self-pay | Admitting: Cardiology

## 2008-01-06 ENCOUNTER — Ambulatory Visit: Payer: Self-pay | Admitting: Family Medicine

## 2008-01-13 ENCOUNTER — Encounter: Admission: RE | Admit: 2008-01-13 | Discharge: 2008-01-13 | Payer: Self-pay | Admitting: Neurosurgery

## 2008-01-27 ENCOUNTER — Ambulatory Visit: Payer: Self-pay | Admitting: Internal Medicine

## 2008-01-29 ENCOUNTER — Ambulatory Visit: Payer: Self-pay | Admitting: Cardiology

## 2008-02-16 ENCOUNTER — Ambulatory Visit: Payer: Self-pay | Admitting: Family Medicine

## 2008-02-16 LAB — CONVERTED CEMR LAB
Epithelial cells, urine: 0 /lpf
Glucose, Urine, Semiquant: NEGATIVE
Specific Gravity, Urine: 1.02
WBC Urine, dipstick: NEGATIVE
WBC, UA: 0 cells/hpf
Yeast, UA: 0
pH: 6

## 2008-02-17 ENCOUNTER — Encounter (INDEPENDENT_AMBULATORY_CARE_PROVIDER_SITE_OTHER): Payer: Self-pay | Admitting: *Deleted

## 2008-02-17 ENCOUNTER — Encounter: Payer: Self-pay | Admitting: Family Medicine

## 2008-02-17 ENCOUNTER — Telehealth: Payer: Self-pay | Admitting: Family Medicine

## 2008-02-19 LAB — CONVERTED CEMR LAB: Vit D, 1,25-Dihydroxy: 33 (ref 30–89)

## 2008-02-23 ENCOUNTER — Ambulatory Visit: Payer: Self-pay | Admitting: Internal Medicine

## 2008-02-23 LAB — CONVERTED CEMR LAB
Albumin: 3.9 g/dL (ref 3.5–5.2)
Bilirubin, Direct: 0.1 mg/dL (ref 0.0–0.3)
Calcium: 9.4 mg/dL (ref 8.4–10.5)
Cholesterol: 156 mg/dL (ref 0–200)
GFR calc Af Amer: 94 mL/min
Glucose, Bld: 86 mg/dL (ref 70–99)
HDL: 53.8 mg/dL (ref >39.0)
MCHC: 33.9 g/dL (ref 30.0–36.0)
MCV: 89 fL (ref 78.0–100.0)
RBC: 3.97 M/uL (ref 3.87–5.11)
Sodium: 141 meq/L (ref 135–145)
Total Protein: 7.6 g/dL (ref 6.0–8.3)
VLDL: 16 mg/dL (ref 0–40)

## 2008-02-25 ENCOUNTER — Ambulatory Visit (HOSPITAL_COMMUNITY): Admission: RE | Admit: 2008-02-25 | Discharge: 2008-02-25 | Payer: Self-pay | Admitting: Neurosurgery

## 2008-02-26 ENCOUNTER — Ambulatory Visit: Payer: Self-pay | Admitting: Cardiology

## 2008-03-01 ENCOUNTER — Ambulatory Visit: Payer: Self-pay | Admitting: Internal Medicine

## 2008-03-01 LAB — CONVERTED CEMR LAB
INR: 1.4 — ABNORMAL HIGH (ref 0.8–1.0)
Prothrombin Time: 16 s — ABNORMAL HIGH (ref 10.9–13.3)

## 2008-03-03 ENCOUNTER — Ambulatory Visit: Payer: Self-pay | Admitting: Cardiology

## 2008-03-05 ENCOUNTER — Ambulatory Visit: Payer: Self-pay | Admitting: Cardiology

## 2008-03-23 ENCOUNTER — Ambulatory Visit: Payer: Self-pay | Admitting: Cardiology

## 2008-04-06 ENCOUNTER — Encounter: Payer: Self-pay | Admitting: Family Medicine

## 2008-04-06 ENCOUNTER — Ambulatory Visit: Payer: Self-pay | Admitting: Cardiology

## 2008-04-28 ENCOUNTER — Telehealth (INDEPENDENT_AMBULATORY_CARE_PROVIDER_SITE_OTHER): Payer: Self-pay | Admitting: *Deleted

## 2008-04-29 ENCOUNTER — Ambulatory Visit: Payer: Self-pay | Admitting: Internal Medicine

## 2008-04-29 ENCOUNTER — Telehealth (INDEPENDENT_AMBULATORY_CARE_PROVIDER_SITE_OTHER): Payer: Self-pay | Admitting: *Deleted

## 2008-05-04 ENCOUNTER — Ambulatory Visit: Payer: Self-pay | Admitting: Cardiology

## 2008-05-07 ENCOUNTER — Ambulatory Visit: Payer: Self-pay | Admitting: Family Medicine

## 2008-06-01 ENCOUNTER — Ambulatory Visit: Payer: Self-pay | Admitting: Cardiovascular Disease

## 2008-06-09 ENCOUNTER — Ambulatory Visit: Payer: Self-pay | Admitting: Family Medicine

## 2008-06-28 ENCOUNTER — Encounter: Admission: RE | Admit: 2008-06-28 | Discharge: 2008-06-28 | Payer: Self-pay | Admitting: Family Medicine

## 2008-06-29 ENCOUNTER — Ambulatory Visit: Payer: Self-pay | Admitting: Internal Medicine

## 2008-07-05 ENCOUNTER — Telehealth: Payer: Self-pay | Admitting: Family Medicine

## 2008-07-29 ENCOUNTER — Ambulatory Visit: Payer: Self-pay | Admitting: Cardiology

## 2008-07-29 ENCOUNTER — Ambulatory Visit: Payer: Self-pay | Admitting: Internal Medicine

## 2008-07-29 ENCOUNTER — Encounter: Payer: Self-pay | Admitting: Family Medicine

## 2008-08-17 ENCOUNTER — Ambulatory Visit: Payer: Self-pay | Admitting: Family Medicine

## 2008-08-17 DIAGNOSIS — E559 Vitamin D deficiency, unspecified: Secondary | ICD-10-CM

## 2008-08-18 LAB — CONVERTED CEMR LAB
ALT: 28 units/L (ref 0–35)
AST: 35 units/L (ref 0–37)
Alkaline Phosphatase: 105 units/L (ref 39–117)
BUN: 14 mg/dL (ref 6–23)
Bilirubin, Direct: 0.1 mg/dL (ref 0.0–0.3)
Calcium: 9.3 mg/dL (ref 8.4–10.5)
Creatinine, Ser: 0.8 mg/dL (ref 0.4–1.2)
Glucose, Bld: 98 mg/dL (ref 70–99)
HDL: 49.8 mg/dL (ref >39.0)
Phosphorus: 3.6 mg/dL (ref 2.3–4.6)
Total Bilirubin: 0.5 mg/dL (ref 0.3–1.2)

## 2008-08-23 ENCOUNTER — Ambulatory Visit: Payer: Self-pay | Admitting: Internal Medicine

## 2008-08-23 ENCOUNTER — Ambulatory Visit: Payer: Self-pay | Admitting: Cardiovascular Disease

## 2008-08-27 ENCOUNTER — Telehealth: Payer: Self-pay | Admitting: Family Medicine

## 2008-08-30 ENCOUNTER — Ambulatory Visit: Payer: Self-pay

## 2008-08-30 ENCOUNTER — Ambulatory Visit: Payer: Self-pay | Admitting: Cardiovascular Disease

## 2008-08-30 ENCOUNTER — Encounter: Payer: Self-pay | Admitting: Family Medicine

## 2008-08-30 ENCOUNTER — Encounter: Payer: Self-pay | Admitting: Cardiovascular Disease

## 2008-09-02 ENCOUNTER — Ambulatory Visit: Payer: Self-pay | Admitting: Oncology

## 2008-09-13 ENCOUNTER — Encounter: Payer: Self-pay | Admitting: Family Medicine

## 2008-09-16 ENCOUNTER — Telehealth (INDEPENDENT_AMBULATORY_CARE_PROVIDER_SITE_OTHER): Payer: Self-pay | Admitting: *Deleted

## 2008-09-21 ENCOUNTER — Ambulatory Visit: Payer: Self-pay | Admitting: Cardiology

## 2008-09-27 ENCOUNTER — Telehealth (INDEPENDENT_AMBULATORY_CARE_PROVIDER_SITE_OTHER): Payer: Self-pay | Admitting: *Deleted

## 2008-09-27 ENCOUNTER — Ambulatory Visit: Payer: Self-pay | Admitting: Family Medicine

## 2008-09-29 ENCOUNTER — Encounter: Payer: Self-pay | Admitting: Cardiovascular Disease

## 2008-09-29 ENCOUNTER — Ambulatory Visit: Payer: Self-pay | Admitting: Cardiovascular Disease

## 2008-09-29 ENCOUNTER — Encounter (INDEPENDENT_AMBULATORY_CARE_PROVIDER_SITE_OTHER): Payer: Self-pay | Admitting: *Deleted

## 2008-09-29 DIAGNOSIS — Q211 Atrial septal defect: Secondary | ICD-10-CM

## 2008-09-29 DIAGNOSIS — G459 Transient cerebral ischemic attack, unspecified: Secondary | ICD-10-CM | POA: Insufficient documentation

## 2008-09-29 DIAGNOSIS — I5181 Takotsubo syndrome: Secondary | ICD-10-CM

## 2008-09-29 LAB — CONVERTED CEMR LAB
Eosinophils Absolute: 0.2 10*3/uL (ref 0.0–0.7)
HCT: 35.7 % — ABNORMAL LOW (ref 36.0–46.0)
Hemoglobin: 11.9 g/dL — ABNORMAL LOW (ref 12.0–15.0)
MCV: 87.8 fL (ref 78.0–100.0)
Monocytes Absolute: 0.4 10*3/uL (ref 0.1–1.0)
Neutro Abs: 1.9 10*3/uL (ref 1.4–7.7)
Platelets: 193 10*3/uL (ref 150–400)
RDW: 12.3 % (ref 11.5–14.6)
TSH: 0.98 microintl units/mL (ref 0.35–5.50)
Vit D, 1,25-Dihydroxy: 38 (ref 30–89)

## 2008-10-01 ENCOUNTER — Encounter: Payer: Self-pay | Admitting: Internal Medicine

## 2008-10-05 ENCOUNTER — Encounter: Payer: Self-pay | Admitting: Family Medicine

## 2008-10-20 ENCOUNTER — Encounter: Payer: Self-pay | Admitting: Pulmonary Disease

## 2008-10-28 ENCOUNTER — Ambulatory Visit: Payer: Self-pay | Admitting: Internal Medicine

## 2008-11-11 ENCOUNTER — Telehealth: Payer: Self-pay | Admitting: Family Medicine

## 2008-11-12 ENCOUNTER — Telehealth (INDEPENDENT_AMBULATORY_CARE_PROVIDER_SITE_OTHER): Payer: Self-pay | Admitting: *Deleted

## 2008-11-25 ENCOUNTER — Encounter: Payer: Self-pay | Admitting: Family Medicine

## 2008-11-29 ENCOUNTER — Encounter: Payer: Self-pay | Admitting: Family Medicine

## 2009-01-03 DIAGNOSIS — I472 Ventricular tachycardia, unspecified: Secondary | ICD-10-CM | POA: Insufficient documentation

## 2009-01-03 DIAGNOSIS — Z95810 Presence of automatic (implantable) cardiac defibrillator: Secondary | ICD-10-CM | POA: Insufficient documentation

## 2009-01-11 ENCOUNTER — Telehealth: Payer: Self-pay | Admitting: Family Medicine

## 2009-01-18 ENCOUNTER — Encounter: Payer: Self-pay | Admitting: *Deleted

## 2009-02-01 ENCOUNTER — Ambulatory Visit: Payer: Self-pay | Admitting: Internal Medicine

## 2009-02-04 ENCOUNTER — Encounter (INDEPENDENT_AMBULATORY_CARE_PROVIDER_SITE_OTHER): Payer: Self-pay | Admitting: *Deleted

## 2009-02-04 ENCOUNTER — Ambulatory Visit: Payer: Self-pay | Admitting: Family Medicine

## 2009-02-17 ENCOUNTER — Encounter (INDEPENDENT_AMBULATORY_CARE_PROVIDER_SITE_OTHER): Payer: Self-pay | Admitting: Cardiology

## 2009-02-23 ENCOUNTER — Encounter: Payer: Self-pay | Admitting: *Deleted

## 2009-03-04 ENCOUNTER — Ambulatory Visit: Payer: Self-pay | Admitting: Family Medicine

## 2009-03-07 LAB — CONVERTED CEMR LAB
ALT: 30 units/L (ref 0–35)
AST: 32 units/L (ref 0–37)
CO2: 31 meq/L (ref 19–32)
Creatinine, Ser: 0.8 mg/dL (ref 0.4–1.2)
HDL: 53.3 mg/dL (ref >39.00)
LDL Cholesterol: 89 mg/dL (ref 0–99)
Phosphorus: 3.8 mg/dL (ref 2.3–4.6)
Sodium: 142 meq/L (ref 135–145)
Total CHOL/HDL Ratio: 3

## 2009-04-05 ENCOUNTER — Telehealth: Payer: Self-pay | Admitting: Family Medicine

## 2009-04-06 ENCOUNTER — Encounter: Payer: Self-pay | Admitting: Family Medicine

## 2009-04-28 ENCOUNTER — Ambulatory Visit: Payer: Self-pay | Admitting: Internal Medicine

## 2009-04-29 ENCOUNTER — Encounter: Payer: Self-pay | Admitting: Internal Medicine

## 2009-05-04 ENCOUNTER — Ambulatory Visit: Payer: Self-pay | Admitting: Family Medicine

## 2009-05-09 ENCOUNTER — Ambulatory Visit: Payer: Self-pay | Admitting: Cardiovascular Disease

## 2009-05-18 ENCOUNTER — Telehealth: Payer: Self-pay | Admitting: Family Medicine

## 2009-05-19 ENCOUNTER — Ambulatory Visit: Payer: Self-pay | Admitting: Pulmonary Disease

## 2009-05-19 DIAGNOSIS — R599 Enlarged lymph nodes, unspecified: Secondary | ICD-10-CM | POA: Insufficient documentation

## 2009-06-01 ENCOUNTER — Ambulatory Visit: Payer: Self-pay | Admitting: Internal Medicine

## 2009-06-02 ENCOUNTER — Ambulatory Visit: Payer: Self-pay | Admitting: Pulmonary Disease

## 2009-06-02 DIAGNOSIS — D869 Sarcoidosis, unspecified: Secondary | ICD-10-CM

## 2009-06-02 LAB — PULMONARY FUNCTION TEST

## 2009-06-14 ENCOUNTER — Ambulatory Visit: Payer: Self-pay | Admitting: Internal Medicine

## 2009-07-13 ENCOUNTER — Telehealth: Payer: Self-pay | Admitting: Family Medicine

## 2009-07-21 ENCOUNTER — Encounter: Admission: RE | Admit: 2009-07-21 | Discharge: 2009-07-21 | Payer: Self-pay | Admitting: Family Medicine

## 2009-07-25 ENCOUNTER — Encounter (INDEPENDENT_AMBULATORY_CARE_PROVIDER_SITE_OTHER): Payer: Self-pay | Admitting: *Deleted

## 2009-08-01 ENCOUNTER — Encounter: Payer: Self-pay | Admitting: Internal Medicine

## 2009-08-02 ENCOUNTER — Ambulatory Visit: Payer: Self-pay | Admitting: Internal Medicine

## 2009-08-24 ENCOUNTER — Ambulatory Visit: Payer: Self-pay | Admitting: Family Medicine

## 2009-09-07 ENCOUNTER — Ambulatory Visit: Payer: Self-pay | Admitting: Family Medicine

## 2009-10-05 ENCOUNTER — Encounter: Payer: Self-pay | Admitting: Family Medicine

## 2009-10-10 ENCOUNTER — Encounter: Payer: Self-pay | Admitting: Family Medicine

## 2009-10-10 ENCOUNTER — Telehealth: Payer: Self-pay | Admitting: Family Medicine

## 2009-10-18 ENCOUNTER — Ambulatory Visit: Payer: Self-pay | Admitting: Family Medicine

## 2009-10-18 LAB — CONVERTED CEMR LAB
Bilirubin Urine: NEGATIVE
Blood in Urine, dipstick: NEGATIVE
Glucose, Urine, Semiquant: NEGATIVE
Specific Gravity, Urine: 1.015
WBC Urine, dipstick: NEGATIVE
pH: 6

## 2009-10-20 LAB — CONVERTED CEMR LAB
ALT: 30 units/L (ref 0–35)
AST: 31 units/L (ref 0–37)
Basophils Absolute: 0 10*3/uL (ref 0.0–0.1)
Calcium: 9.6 mg/dL (ref 8.4–10.5)
Creatinine, Ser: 0.8 mg/dL (ref 0.4–1.2)
Eosinophils Relative: 5.6 % — ABNORMAL HIGH (ref 0.0–5.0)
GFR calc non Af Amer: 93.81 mL/min (ref >60)
Glucose, Bld: 96 mg/dL (ref 70–99)
LDL Cholesterol: 91 mg/dL (ref 0–99)
Lymphocytes Relative: 29.4 % (ref 12.0–46.0)
Lymphs Abs: 1 10*3/uL (ref 0.7–4.0)
Monocytes Relative: 9.8 % (ref 3.0–12.0)
Neutrophils Relative %: 54.4 % (ref 43.0–77.0)
Phosphorus: 3.7 mg/dL (ref 2.3–4.6)
Platelets: 175 10*3/uL (ref 150.0–400.0)
Potassium: 4.1 meq/L (ref 3.5–5.1)
RDW: 12 % (ref 11.5–14.6)
Sodium: 143 meq/L (ref 135–145)
TSH: 1.61 microintl units/mL (ref 0.35–5.50)
Total CHOL/HDL Ratio: 3
Triglycerides: 110 mg/dL (ref 0.0–149.0)
WBC: 3.5 10*3/uL — ABNORMAL LOW (ref 4.5–10.5)

## 2009-10-26 ENCOUNTER — Telehealth: Payer: Self-pay | Admitting: Family Medicine

## 2009-11-04 ENCOUNTER — Encounter: Payer: Self-pay | Admitting: Internal Medicine

## 2009-11-23 ENCOUNTER — Telehealth: Payer: Self-pay | Admitting: Internal Medicine

## 2009-11-24 ENCOUNTER — Ambulatory Visit: Payer: Self-pay | Admitting: Internal Medicine

## 2009-12-15 ENCOUNTER — Ambulatory Visit (HOSPITAL_COMMUNITY): Admission: RE | Admit: 2009-12-15 | Discharge: 2009-12-15 | Payer: Self-pay | Admitting: Internal Medicine

## 2009-12-15 ENCOUNTER — Ambulatory Visit: Payer: Self-pay

## 2009-12-15 ENCOUNTER — Ambulatory Visit: Payer: Self-pay | Admitting: Cardiology

## 2009-12-15 ENCOUNTER — Encounter: Payer: Self-pay | Admitting: Internal Medicine

## 2009-12-16 ENCOUNTER — Telehealth: Payer: Self-pay | Admitting: Internal Medicine

## 2009-12-20 ENCOUNTER — Ambulatory Visit: Payer: Self-pay | Admitting: Internal Medicine

## 2009-12-20 LAB — CONVERTED CEMR LAB
Basophils Relative: 0.6 % (ref 0.0–3.0)
CO2: 30 meq/L (ref 19–32)
Calcium: 9.4 mg/dL (ref 8.4–10.5)
Chloride: 106 meq/L (ref 96–112)
Creatinine, Ser: 0.8 mg/dL (ref 0.4–1.2)
Eosinophils Relative: 5 % (ref 0.0–5.0)
Hemoglobin: 12.2 g/dL (ref 12.0–15.0)
INR: 0.9 (ref 0.8–1.0)
Lymphocytes Relative: 26.2 % (ref 12.0–46.0)
MCV: 89.7 fL (ref 78.0–100.0)
Neutro Abs: 2 10*3/uL (ref 1.4–7.7)
Neutrophils Relative %: 58.3 % (ref 43.0–77.0)
Prothrombin Time: 9.6 s (ref 9.1–11.7)
RBC: 4.05 M/uL (ref 3.87–5.11)
Sodium: 142 meq/L (ref 135–145)
WBC: 3.4 10*3/uL — ABNORMAL LOW (ref 4.5–10.5)

## 2009-12-22 ENCOUNTER — Ambulatory Visit: Payer: Self-pay | Admitting: Internal Medicine

## 2009-12-22 ENCOUNTER — Ambulatory Visit (HOSPITAL_COMMUNITY): Admission: RE | Admit: 2009-12-22 | Discharge: 2009-12-22 | Payer: Self-pay | Admitting: Internal Medicine

## 2009-12-23 ENCOUNTER — Encounter: Payer: Self-pay | Admitting: Family Medicine

## 2009-12-23 ENCOUNTER — Ambulatory Visit: Payer: Self-pay | Admitting: Internal Medicine

## 2009-12-23 ENCOUNTER — Telehealth (INDEPENDENT_AMBULATORY_CARE_PROVIDER_SITE_OTHER): Payer: Self-pay | Admitting: *Deleted

## 2009-12-28 ENCOUNTER — Telehealth: Payer: Self-pay | Admitting: Internal Medicine

## 2009-12-29 ENCOUNTER — Encounter: Payer: Self-pay | Admitting: Internal Medicine

## 2010-01-02 ENCOUNTER — Ambulatory Visit: Payer: Self-pay

## 2010-01-02 ENCOUNTER — Encounter: Payer: Self-pay | Admitting: Internal Medicine

## 2010-01-03 ENCOUNTER — Ambulatory Visit: Payer: Self-pay | Admitting: Family Medicine

## 2010-01-06 ENCOUNTER — Ambulatory Visit: Payer: Self-pay | Admitting: Family Medicine

## 2010-01-09 LAB — CONVERTED CEMR LAB
TSH: 1.433 microintl units/mL (ref 0.350–4.500)
Vit D, 25-Hydroxy: 39 ng/mL (ref 30–89)

## 2010-03-29 ENCOUNTER — Ambulatory Visit: Payer: Self-pay | Admitting: Family Medicine

## 2010-03-29 LAB — CONVERTED CEMR LAB
Bilirubin Urine: NEGATIVE
Casts: 0 /lpf
Glucose, Urine, Semiquant: NEGATIVE
KOH Prep: NEGATIVE
Specific Gravity, Urine: 1.02
Whiff Test: NEGATIVE
Yeast, UA: 0
pH: 6.5

## 2010-04-04 ENCOUNTER — Ambulatory Visit: Payer: Self-pay | Admitting: Internal Medicine

## 2010-04-04 DIAGNOSIS — G4733 Obstructive sleep apnea (adult) (pediatric): Secondary | ICD-10-CM

## 2010-04-12 ENCOUNTER — Ambulatory Visit: Payer: Self-pay | Admitting: Family Medicine

## 2010-05-01 ENCOUNTER — Ambulatory Visit: Payer: Self-pay | Admitting: Cardiovascular Disease

## 2010-05-02 ENCOUNTER — Encounter: Payer: Self-pay | Admitting: Family Medicine

## 2010-05-02 ENCOUNTER — Telehealth (INDEPENDENT_AMBULATORY_CARE_PROVIDER_SITE_OTHER): Payer: Self-pay

## 2010-05-03 ENCOUNTER — Encounter (HOSPITAL_COMMUNITY): Admission: RE | Admit: 2010-05-03 | Discharge: 2010-06-21 | Payer: Self-pay | Admitting: Cardiology

## 2010-05-03 ENCOUNTER — Encounter: Payer: Self-pay | Admitting: Cardiology

## 2010-05-03 ENCOUNTER — Ambulatory Visit: Payer: Self-pay | Admitting: Cardiology

## 2010-05-03 ENCOUNTER — Ambulatory Visit: Payer: Self-pay

## 2010-05-15 ENCOUNTER — Telehealth: Payer: Self-pay | Admitting: Family Medicine

## 2010-05-17 ENCOUNTER — Ambulatory Visit: Payer: Self-pay | Admitting: Family Medicine

## 2010-05-23 ENCOUNTER — Telehealth: Payer: Self-pay | Admitting: Family Medicine

## 2010-06-21 ENCOUNTER — Ambulatory Visit: Payer: Self-pay | Admitting: Internal Medicine

## 2010-08-01 ENCOUNTER — Ambulatory Visit: Payer: Self-pay

## 2010-08-04 ENCOUNTER — Ambulatory Visit: Payer: Self-pay | Admitting: Family Medicine

## 2010-08-23 ENCOUNTER — Encounter
Admission: RE | Admit: 2010-08-23 | Discharge: 2010-08-23 | Payer: Self-pay | Source: Home / Self Care | Attending: Family Medicine | Admitting: Family Medicine

## 2010-08-23 LAB — HM MAMMOGRAPHY: HM Mammogram: NORMAL

## 2010-08-25 ENCOUNTER — Telehealth: Payer: Self-pay | Admitting: Family Medicine

## 2010-08-28 ENCOUNTER — Encounter: Payer: Self-pay | Admitting: Family Medicine

## 2010-08-29 ENCOUNTER — Encounter: Payer: Self-pay | Admitting: Family Medicine

## 2010-09-11 ENCOUNTER — Telehealth (INDEPENDENT_AMBULATORY_CARE_PROVIDER_SITE_OTHER): Payer: Self-pay | Admitting: *Deleted

## 2010-09-11 ENCOUNTER — Ambulatory Visit
Admission: RE | Admit: 2010-09-11 | Discharge: 2010-09-11 | Payer: Self-pay | Source: Home / Self Care | Attending: Family Medicine | Admitting: Family Medicine

## 2010-09-11 ENCOUNTER — Other Ambulatory Visit: Payer: Self-pay | Admitting: Family Medicine

## 2010-09-11 LAB — LIPID PANEL
HDL: 42.9 mg/dL (ref >39.00)
Total CHOL/HDL Ratio: 4
Triglycerides: 128 mg/dL (ref 0.0–149.0)

## 2010-09-11 LAB — BASIC METABOLIC PANEL
CO2: 31 mEq/L (ref 19–32)
GFR: 99.19 mL/min (ref >60.00)
Glucose, Bld: 100 mg/dL — ABNORMAL HIGH (ref 70–99)
Potassium: 4 mEq/L (ref 3.5–5.1)
Sodium: 139 mEq/L (ref 135–145)

## 2010-09-15 ENCOUNTER — Ambulatory Visit
Admission: RE | Admit: 2010-09-15 | Discharge: 2010-09-15 | Payer: Self-pay | Source: Home / Self Care | Attending: Family Medicine | Admitting: Family Medicine

## 2010-09-19 NOTE — Progress Notes (Signed)
Summary: post-op wound care instructions  Phone Note Outgoing Call   Call placed by: Altha Harm, LPN,  Dec 23, 1608 9:10 AM Call placed to: Patient Summary of Call: Left message for patient to change mesh on incision line once weekly for 6 weeks and return for an office visit with Dr. Graciela Husbands 01/02/2010 @ 11:15am.   Initial call taken by: Altha Harm, LPN,  Dec 24, 9602 9:14 AM  Follow-up for Phone Call        pt calling back... needs better instructions on wound care, [phone]  [name]  Dec 23, 2009 10:15 AM   Additional Follow-up for Phone Call Additional follow up Details #1::        Spoke with [name].  When she took dressing off this am the mesh was "sticky" on both sides and just wanted to know if it was ok to put a small dressing over the mesh to protect her clothes.  I re-instructed her to change the mesh weekly and a small dressing over the mess is ok Additional Follow-up by: Altha Harm, LPN,  Dec 24, 5407 11:10 AM

## 2010-09-19 NOTE — Procedures (Signed)
Summary: guidant./cy   Current Medications (verified): 1)  Lumigan 0.03 %  Soln (Bimatoprost) .... Use As Directed 2)  Zocor 40 Mg  Tabs (Simvastatin) .... Take 1/2 Tablet By Mouth Once Daily 3)  Metoprolol Tartrate 25 Mg Tabs (Metoprolol Tartrate) .... Take One Tablet Two Times A Day 4)  Adult Aspirin Low Strength 81 Mg  Tbdp (Aspirin) .... One By Mouth Daily 5)  Synthroid 50 Mcg  Tabs (Levothyroxine Sodium) .... One By Mouth Once Daily 6)  Ambien 10 Mg  Tabs (Zolpidem Tartrate) .... One By Mouth At Bedtime 7)  Ramipril 5 Mg Caps (Ramipril) .[name].. 1 By Mouth Once Daily 8)  Hydrochlorothiazide 25 Mg  Tabs (Hydrochlorothiazide) .... 1/2 By Mouth Once Daily 9)  Klor-Con 10 10 Meq Cr-Tabs (Potassium Chloride) .... 1/2 Tab Once Daily 10)  Allegra 180 Mg  Tabs (Fexofenadine Hcl) .... Take One By Mouth Daily As Needed 11)  Protonix 40 Mg Tbec (Pantoprazole Sodium) .... Take 1 Tablet By Mouth As Needed 12)  Astelin 137 Mcg/spray Soln (Azelastine Hcl) .... 2 Sprays in Each Nostril Twice Daily 13)  Vitamin C 500 Mg  Tabs (Ascorbic Acid) .... Take 1 Tablet By Mouth Once A Day 14)  Advil 200 Mg Tabs (Ibuprofen) .... Otc As Directed. 15)  Tylenol Extra Strength 500 Mg Tabs (Acetaminophen) .... Otc As Directed. 16)  Benadryl 25 Mg Caps (Diphenhydramine Hcl) .... Otc As Directed.  Allergies: 1)  Codeine 2)  Tegretol 3)  * Tape 4)  Paxil 5)  Elavil 6)  Lipitor 7)  * Crestor 8)  * Zyrtec 9)  * Contrast Dye   ICD Specifications Following MD:  Sherryl Manges, MD     ICD Vendor:  [facility] Scientific     ICD Model Number:  E100     ICD Serial Number:  [id number] ICD DOI:  12/22/2009     ICD Implanting MD:  Sherryl Manges, MD  Lead 1:    Location: RV     DOI: 01/21/2004     Model #: [id number]     Serial #: [id number]     Status: active  Indications::  SARCOID, NSVT   ICD Follow Up Battery Voltage:  GOOD V     Charge Time:  8.4 seconds     Battery Est. Longevity:  9 YRS Underlying rhythm:  SR ICD Dependent:  No        ICD Device Measurements Right Ventricle:  Amplitude: 12.7 mV, Impedance: 598 ohms, Threshold: 0.6 V at 0.4 msec Shock Impedance: 54 ohms   Episodes MS Episodes:  0     Shock:  0     ATP:  0     Nonsustained:  1     Atrial Therapies:  0 Ventricular Pacing:  <1%  Brady Parameters Mode VVI     Lower Rate Limit:  40      Tachy Zones VF:  240     VT:  210     VT1:  170 (MONITOR)     Next Cardiology Appt Due:  09/20/2010 Tech Comments:  1 NST EPISODE LASTING 6 SECONDS.  NORMAL DEVICE FUNCTION.  NO CHANGES MADE. PT BROUGHT BACK LATITUDE TRANSMITTER--WILL RETURN FOR PT. ROV IN 3 MTHS W/DEVICE CLINIC. [name]  June 21, 2010 11:08 AM

## 2010-09-19 NOTE — Progress Notes (Signed)
Summary: does she need antibiotic  Phone Note Call from Patient Call back at [phone]   Caller: Patient Call For: Judith Part MD Summary of Call: Patient says that her mother is in hospital, and they were told this morning that her mother has staph infection. Patient says that she has been in the hospital room with her mom every day and wants to know if she needs to start antibiotic due to her health issues. Please advise. Uses [facility].  Initial call taken by: Melody Comas,  October 26, 2009 11:53 AM  Follow-up for Phone Call        abx would not prevent infection- but need to start if she develops any symptoms  be on lookout for fever / any skin lesions or pus -- or any other new symptoms and update me please  continue to practice good hand washing / etc  Follow-up by: Judith Part MD,  October 26, 2009 1:40 PM  Additional Follow-up for Phone Call Additional follow up Details #1::        Patient notified as instructed Additional Follow-up by: Linde Gillis CMA Duncan Dull),  October 26, 2009 1:46 PM

## 2010-09-19 NOTE — Progress Notes (Signed)
Summary: Remus Loffler  Phone Note Refill Request Message from:  Fax from Pharmacy on May 23, 2010 11:15 AM  Refills Requested: Medication #1:  AMBIEN 10 MG  TABS one by mouth at bedtime   Last Refilled: 06/11/1951 [facility] from [facility]. [phone]  Initial call taken by: Melody Comas,  May 23, 2010 11:25 AM  Follow-up for Phone Call        looks like she got 28 with 3 ref in feb of 2011 -- should not need it yet?  Follow-up by: Judith Part MD,  May 23, 2010 12:08 PM  Additional Follow-up for Phone Call Additional follow up Details #1::        Spoke with pharmacist, Arlene at [facility] . she said since is controlled substance can refill for 5 months. Arlene updated refill from refills given 09/2009.Lewanda Rife LPN  May 23, 2010 12:56 PM

## 2010-09-19 NOTE — Letter (Signed)
Summary: Device-Delinquent Phone Journalist, newspaper, Main Office  [address]   [place], [place] [address]   Phone: [phone]  Fax: [phone]     November 04, 2009 MRN: [id number]   [facility] [address] [place], [place]  [address]   Dear [name],  According to our records, you were scheduled for a device phone transmission on  November 03, 2009.     We did not receive any results from this check.  If you transmitted on your scheduled day, please call us to help troubleshoot your system.  If you forgot to send your transmission, please send one upon receipt of this letter.  Thank you,   Architectural technologist [facility]

## 2010-09-19 NOTE — Assessment & Plan Note (Signed)
Summary: ROA FOR FOLLOW-UP   Vital Signs:  Patient profile:   60 year old female Height:      66 inches Weight:      212.25 pounds BMI:     34.38 Temp:     98.7 degrees F oral Pulse rate:   78 / minute Pulse rhythm:   regular BP sitting:   98 / 60  (left arm) Cuff size:   large CC: Feeling tired for 2-3 weeks   History of Present Illness: here for fatigue  feeling run down  sitting in hosp night and day with her sick mother / and skilled nursing facility  this is stressful  has to travel to [place] -- really hard   had some labs nl with defibrillator change out recently  saw ortho for knee- got injection fibro was acting up  thyroid and chol well controlled - stable from jan   vit D good last summer-- is concened about that --  is concerned about thyroid -- has gained wt   little sore throat   no diarrhea - mom had c diff    bp very well controlled   Allergies: 1)  ! 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) UNSPECIFIED OTALGIA (ICD-388.70) FATIGUE (ICD-780.79) UNSPECIFIED VITAMIN D DEFICIENCY (ICD-268.9) HEADACHE (ICD-784.0) Hosp for TACHYCARDIA (ICD-785.0) Hx of PULMONARY EMBOLISM (ICD-415.19) MITRAL VALVE PROLAPSE (ICD-424.0) FATTY LIVER DISEASE (ICD-571.8) 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 EF 50% (07/20/2002) Carotid doppler- negative (10/2004) Admit- GI bleed, PUD/ diverticulosis ( EGD/ colonoscopy)  (03/2003) PE- hosp./ defib.(nonsustained v tach) ABI's- normal (06/2005) ICD - Guidant   Family History: Last updated: 09/07/2009 mother DM brother DM Family History of Cardiomyopathy:  brother - thyroid problem   allergies: sister heart disease: sister, brother, father cancer: maternal grandmother (colon) maternal grandfather (lung)   Social History: Last updated: 05/19/2009 Patient is a former smoker.  started at age 25.  less than 1 ppd.  quit 1993 rare alcoholic beverage Retired from [employer] pt has 2 Children Lives alone pt is divorced.   Risk Factors: Smoking Status: quit (11/18/2006)  Review of Systems General:  Complains of fatigue; denies chills, fever, loss of appetite, and malaise. Eyes:  Denies blurring and eye irritation. ENT:  Complains of sore throat; denies earache, hoarseness, nasal congestion, and postnasal drainage. CV:  Denies chest pain or discomfort, palpitations, shortness of breath with exertion, and swelling of feet. Resp:  Denies cough and wheezing. GI:  Denies abdominal pain, change in bowel habits, nausea, and vomiting. GU:  Denies dysuria, hematuria, and urinary frequency. MS:  Complains of joint pain, muscle aches, and stiffness; denies joint redness and joint swelling. Derm:  Denies itching, lesion(s), poor wound healing, and rash. Neuro:  Denies numbness and tingling. Psych:  despite stress mood is ok . Endo:  Denies excessive thirst and excessive urination. Heme:  Denies abnormal bruising and bleeding.  Physical  Exam  General:  overweight appearing female inNAD Head:  normocephalic, atraumatic, and no abnormalities observed.   Eyes:  vision grossly intact, pupils equal, pupils round, and pupils reactive to light.  no  conjunctival pallor, injection or icterus  Ears:  R ear normal and L ear normal.   Nose:  no nasal discharge.   Mouth:  pharynx pink and moist.   Neck:  supple with full rom and no masses or thyromegally, no JVD or carotid bruit  Lungs:  Normal respiratory effort, chest expands symmetrically. Lungs are clear to auscultation, no crackles or wheezes. Heart:  Normal rate and regular rhythm. S1 and S2 normal without gallop, murmur, click, rub or other extra sounds. Abdomen:  Bowel sounds positive,abdomen soft and non-tender without masses, organomegaly or hernias noted. no renal bruits  Msk:  No deformity or scoliosis noted of thoracic or lumbar spine.  no acute joint changes pos trigger points baseline  pain upon getting up from table  Pulses:  R and L carotid,radial,femoral,dorsalis pedis and posterior tibial pulses are full and equal bilaterally Extremities:  No clubbing, cyanosis, edema, or deformity noted with normal full range of motion of all joints.   Neurologic:  sensation intact to light touch, gait normal, and DTRs symmetrical and normal.   Skin:  Intact without suspicious lesions or rashes no pallor or jaundice  Cervical Nodes:  No lymphadenopathy noted Inguinal Nodes:  No significant adenopathy Psych:  nl affect- just seems fatigued    Impression & Recommendations:  Problem # 1:  FATIGUE (ICD-780.79) Assessment Deteriorated suspect this is multifactorial -- with lack of time to exercise or care for herself and traveling to care for her sick mother disc aiming at 20 min exercise once daily as tolerated check D and tsh today update if not imp  Problem # 2:  HYPOTHYROIDISM (ICD-244.9) Assessment: Unchanged last nl tsh jan - will re check today in light of fatigue for now -will continue current dose  Her updated medication list for this problem includes:    Synthroid 50 Mcg Tabs (Levothyroxine sodium) ..... One by mouth once daily  Orders: Venipuncture (14782) T-TSH  [phone]) T- * Misc. Laboratory test [phone]) Specimen Handling (62952)  Problem # 3:  UNSPECIFIED VITAMIN D DEFICIENCY (ICD-268.9) Assessment: Unchanged last level 40 last summer- is not missing doses check again in light of fatigue  Orders: Venipuncture (84132) T-TSH ([phone]) T- * Misc. Laboratory test [phone]) Specimen Handling (34742)  Complete Medication List: 1)  Lumigan 0.03 % Soln (Bimatoprost) .... Use as directed 2)  Zocor 40 Mg Tabs (Simvastatin) .... Take 1/2 tablet by mouth once daily 3)  Metoprolol Tartrate 25 Mg Tabs (Metoprolol tartrate) .... Take one tablet two times a day 4)  Adult Aspirin Low Strength 81 Mg Tbdp (Aspirin) .... One by mouth daily 5)  Synthroid 50 Mcg Tabs (Levothyroxine sodium) .... One by mouth once daily 6)  Ambien 10 Mg Tabs (Zolpidem tartrate) .... One by mouth at bedtime 7)  Ramipril 5 Mg Caps (Ramipril) .[name].. 1 by mouth once daily 8)  Hydrochlorothiazide 25 Mg Tabs (Hydrochlorothiazide) .... 1/2 by mouth once daily 9)  Klor-con 10 10 Meq Cr-tabs (Potassium chloride) .... 1/2 tab once daily 10)  Allegra 180 Mg Tabs (Fexofenadine hcl) .... Take one by mouth daily as needed 11)  Protonix 40 Mg Tbec (Pantoprazole sodium) .... Take 1 tablet by mouth as needed 12)  Astelin 137 Mcg/spray Soln (Azelastine hcl) .... 2 sprays in each nostril twice daily  Patient Instructions:  1)  I am checking your thyroid test and vit D today 2)  other labs are up to date  3)  your fatigue may be multifactorial -- with the lack of time to care for yourself 4)  exercise is really important for you  5)  please try to get 20 minutes per day even when traveling  6)  aim at less junk food   Current Allergies (reviewed today): ! ZOCOR CODEINE TEGRETOL * TAPE PAXIL ELAVIL LIPITOR * CRESTOR * ZYRTEC * CONTRAST DYE

## 2010-09-19 NOTE — Cardiovascular Report (Signed)
Summary: Office Visit  Office Visit   Imported By: Marylou Mccoy 08/31/2009 13:30:59  _____________________________________________________________________  External Attachment:    Type:   Image     Comment:   External Document

## 2010-09-19 NOTE — Consult Note (Signed)
Summary: [facility]  [facility]   Imported By: Lanelle Bal 10/10/2009 07:50:06  _____________________________________________________________________  External Attachment:    Type:   Image     Comment:   External Document

## 2010-09-19 NOTE — Progress Notes (Signed)
Summary: echo results and ICD gen change  Phone Note Outgoing Call Call back at [facility] Phone [phone] Call back at [phone]   Call placed by: Gypsy Balsam RN BSN,  December 16, 2009 6:27 PM Summary of Call: Called patient and left message on machine to call about echo results.  Pt needs to be scheduled for ICD gen change in next 2 weeks with Dr Graciela Husbands. Pt has [employer] device, will be changed out per rotation. Gypsy Balsam RN BSN  December 16, 2009 6:28 PM   Follow-up for Phone Call        returning call, Migdalia Dk  Dec 19, 2009 4:15 PM  12/20/09 10am--evidently pt needs to be sched for generator change--will refer to dr Graciela Husbands for instructions--nt Follow-up by: Ledon Snare, RN,  Dec 20, 2009 10:02 AM     Appended Document: echo results and ICD gen change Dennis Bast, RN scheduled pt for ICD battery change-out on Thursday, 12/22/09 by Dr. Graciela Husbands.  She is to arrive at 0530 for procedure at 0730.  She is coming in to the office today for lab work.  Patient verbalized agreement and understanding of this scheduling.

## 2010-09-19 NOTE — Letter (Signed)
Summary: [facility]  [facility]   Imported By: Lanelle Bal 12/29/2009 14:02:07  _____________________________________________________________________  External Attachment:    Type:   Image     Comment:   External Document

## 2010-09-19 NOTE — Assessment & Plan Note (Signed)
Summary: TOWER FLU SHOT/[facility]  Nurse Visit   Allergies: 1)  ! Zocor 2)  Codeine 3)  Tegretol 4)  * Tape 5)  Paxil 6)  Elavil 7)  Lipitor 8)  * Crestor 9)  * Zyrtec 10)  * Contrast Dye  Orders Added: 1)  Flu Vaccine 38yrs + MEDICARE PATIENTS [Q2039] 2)  Administration Flu vaccine - MCR [G0008]    Flu Vaccine Consent Questions     Do you have a history of severe allergic reactions to this vaccine? no    Any prior history of allergic reactions to egg and/or gelatin? no    Do you have a sensitivity to the preservative Thimersol? no    Do you have a past history of Guillan-Barre Syndrome? no    Do you currently have an acute febrile illness? no    Have you ever had a severe reaction to latex? no    Vaccine information given and explained to patient? yes    Are you currently pregnant? no    Lot Number:[id number]   Exp Date:02/17/2011   Manufacturer: [employer]    Site Given  Right Deltoid IMu

## 2010-09-19 NOTE — Assessment & Plan Note (Signed)
Summary: Cardiology Nuclear Testing  Nuclear Med Background Indications for Stress Test: Evaluation for Ischemia   History: Defibrillator, Echo, Myocardial Perfusion Study, MVP  History Comments: '03 MPS: NL 4/11 Echo: NL Hx. Pulmonary sarcoidosis, NSVT.  Symptoms: Chest Pain, Fatigue, Palpitations  Symptoms Comments: Radiates to right side.   Nuclear Pre-Procedure Cardiac Risk Factors: Family History - CAD, History of Smoking, Hypertension, Lipids, TIA Caffeine/Decaff Intake: None NPO After: 9:00 PM Lungs: clear IV 0.9% NS with Angio Cath: 22g     IV Site: R Antecubital IV Started by: Bonnita Levan, RN Chest Size (in) 42     Cup Size C     Height (in): 66 Weight (lb): 208 BMI: 33.69  Nuclear Med Study 1 or 2 day study:  1 day     Stress Test Type:  Stress Reading MD:  Willa Rough, MD     Referring MD:  S.Klein Resting Radionuclide:  Technetium 65m Tetrofosmin     Resting Radionuclide Dose:  10.9 mCi  Stress Radionuclide:  Technetium 47m Tetrofosmin     Stress Radionuclide Dose:  33 mCi   Stress Protocol Exercise Time (min):  5:00 min     Max HR:  144 bpm     Predicted Max HR:  159 bpm  Max Systolic BP: 163 mm Hg     Percent Max HR:  90.57 %     METS: 7.00 Rate Pressure Product:  09811    Stress Test Technologist:  Milana Na, EMT-P     Nuclear Technologist:  Domenic Polite, CNMT  Rest Procedure  Myocardial perfusion imaging was performed at rest 45 minutes following the intravenous administration of Technetium 62m Tetrofosmin.  Stress Procedure  The patient exercised for 5:00. The patient stopped due to fatigue and denied any chest pain.  There were no significant ST-T wave changes and rare pvcs.  Technetium 53m Tetrofosmin was injected at peak exercise and myocardial perfusion imaging was performed after a brief delay.  QPS Raw Data Images:  Normal; no motion artifact; normal heart/lung ratio. Stress Images:  Normal homogeneous uptake in all areas of the  myocardium. Rest Images:  Normal homogeneous uptake in all areas of the myocardium. Subtraction (SDS):  No evidence of ischemia. Transient Ischemic Dilatation:  1.04  (Normal <1.22)  Lung/Heart Ratio:  .31  (Normal <0.45)  Quantitative Gated Spect Images QGS EDV:  99 ml QGS ESV:  41 ml QGS EF:  59 % QGS cine images:  good motion  Findings Normal nuclear study      Overall Impression  Exercise Capacity: Fair exercise capacity. BP Response: Normal blood pressure response. Clinical Symptoms: SOB ECG Impression: No significant ST segment change suggestive of ischemia. Overall Impression Comments: There is no scar or ischemia. Wall motion is good. Exercise tolerance is limited. O2 saturation dropped to 90% at peak stress.  Appended Document: Cardiology Nuclear Testing normal nuclear study  Appended Document: Cardiology Nuclear Testing PT AWARE./CY

## 2010-09-19 NOTE — Progress Notes (Signed)
Summary: refill requests from [facility]  Phone Note Refill Request Message from:  Fax from Pharmacy  Refills Requested: Medication #1:  AMBIEN 10 MG  TABS one by mouth at bedtime  Medication #2:  SYNTHROID 50 MCG  TABS one by mouth once daily Faxed forms from [facility] are on your shelf.  Initial call taken by: Lowella Petties CMA,  October 10, 2009 12:15 PM  Follow-up for Phone Call        form done and in nurse in box   Follow-up by: Judith Part MD,  October 10, 2009 1:26 PM  Additional Follow-up for Phone Call Additional follow up Details #1::        completed forms faxed to [phone] as instructed.Lewanda Rife LPN  October 10, 2009 4:53 PM     New/Updated Medications: AMBIEN 10 MG  TABS (ZOLPIDEM TARTRATE) one by mouth at bedtime Prescriptions: SYNTHROID 50 MCG  TABS (LEVOTHYROXINE SODIUM) one by mouth once daily  #90 x 3   Entered and Authorized by:   Judith Part MD   Signed by:   Lewanda Rife LPN on 78/46/9629   Method used:   Historical   RxID:   [id number] AMBIEN 10 MG  TABS (ZOLPIDEM TARTRATE) one by mouth at bedtime  #90 x 3   Entered and Authorized by:   Judith Part MD   Signed by:   Lewanda Rife LPN on 36/64/4034   Method used:   Historical   RxID:   [id number]

## 2010-09-19 NOTE — Procedures (Signed)
Summary: wound check per patient walk in   Allergies: 1)  ! Zocor 2)  Codeine 3)  Tegretol 4)  * Tape 5)  Paxil 6)  Elavil 7)  Lipitor 8)  * Crestor 9)  * Zyrtec 10)  * Contrast Dye  Comments:  Provider: Altha Harm, LPN (Dec 23, 1189 3:23 PM)   ICD Specifications Following MD:  Sherryl Manges, MD     ICD Vendor:  [facility] [employer]     ICD Model Number:  T135     ICD Serial Number:  [id number] ICD DOI:  01/21/2004     ICD Implanting MD:  Sherryl Manges, MD  Lead 1:    Location: RV     DOI: 01/21/2004     Model #: 6213     Serial #: [id number]     Status: active  Indications::  SARCOID, NSVT   ICD Follow Up ICD Dependent:  No      Brady Parameters Mode VVI     Lower Rate Limit:  40      Tachy Zones VF:  240     VT:  210     VT1:  170 (MONITOR)

## 2010-09-19 NOTE — Progress Notes (Signed)
Summary: need written script and refill for [facility]  Phone Note Refill Request Call back at Home Phone [phone] Message from:  Patient  Refills Requested: Medication #1:  AMBIEN 10 MG  TABS one by mouth at bedtime Pt needs written 3 month script to mail to mail order pharmacy and a months supply [facility].  Initial call taken by: Lowella Petties CMA,  May 15, 2010 3:26 PM  Follow-up for Phone Call        looks like we did 90 day supply with 3 ref in feb last year - so too early?   Follow-up by: Judith Part MD,  May 15, 2010 4:26 PM  Additional Follow-up for Phone Call Additional follow up Details #1::        Patient notified as instructed by telephone that one yr of refills were sent to [facility] 10/10/09. Pt will ck with [facility].Lewanda Rife LPN  May 15, 2010 4:35 PM

## 2010-09-19 NOTE — Miscellaneous (Signed)
Summary: Device change out  Clinical Lists Changes  Observations: Added new observation of ICD IMPL DTE: 12/22/2009 (12/29/2009 14:47) Added new observation of ICD SERL#: [id number]  (12/29/2009 14:47) Added new observation of ICD MODL#: E100  (12/29/2009 14:47)       ICD Specifications Following MD:  Sherryl Manges, MD     ICD Vendor:  [facility] [employer]     ICD Model Number:  E100     ICD Serial Number:  [id number] ICD DOI:  12/22/2009     ICD Implanting MD:  Sherryl Manges, MD  Lead 1:    Location: RV     DOI: 01/21/2004     Model #: 8119     Serial #: [id number]     Status: active  Indications::  SARCOID, NSVT   ICD Follow Up ICD Dependent:  No      Brady Parameters Mode VVI     Lower Rate Limit:  40      Tachy Zones VF:  240     VT:  210     VT1:  170 (MONITOR)

## 2010-09-19 NOTE — Assessment & Plan Note (Signed)
Summary: ? VAGINAL INFECTION   Vital Signs:  Patient profile:   60 year old female Height:      66 inches Weight:      212.50 pounds BMI:     34.42 Temp:     98.1 degrees F oral Pulse rate:   76 / minute Pulse rhythm:   regular BP sitting:   90 / 62  (left arm) Cuff size:   large  Vitals Entered By: Lewanda Rife LPN (March 29, 2010 3:28 PM) CC: ?vaginal infection with vaginal discharge and burn upon urination with frequency   History of Present Illness: thinks she has Bacterial vag infection  started symptoms out of town  used some otc med  started back on metronidazole gel- after 2 days   is burning and itching  irritates to rub with the tissue  urine burns it when it hits it   also frequent urination    Allergies: 1)  ! 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) UNSPECIFIED OTALGIA (ICD-388.70) FATIGUE (ICD-780.79) UNSPECIFIED VITAMIN D DEFICIENCY (ICD-268.9) HEADACHE (ICD-784.0) Hosp for TACHYCARDIA (ICD-785.0) Hx of PULMONARY EMBOLISM (ICD-415.19) MITRAL VALVE PROLAPSE (ICD-424.0) FATTY LIVER DISEASE (ICD-571.8) 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 EF 50% (07/20/2002) Carotid doppler- negative (10/2004) Admit- GI  bleed, PUD/ diverticulosis ( EGD/ colonoscopy)  (03/2003) PE- hosp./ defib.(nonsustained v tach) ABI's- normal (06/2005) ICD - Guidant   Family History: Last updated: 09/07/2009 mother DM brother DM Family History of Cardiomyopathy:  brother - thyroid problem   allergies: sister heart disease: sister, brother, father cancer: maternal grandmother (colon) maternal grandfather (lung)   Social History: Last updated: 05/19/2009 Patient is a former smoker.  started at age 89.  less than 1 ppd.  quit 1993 rare alcoholic beverage Retired from [employer] pt has 2 Children Lives alone pt is divorced.   Risk Factors: Smoking Status: quit (11/18/2006)  Review of Systems General:  Denies chills, fatigue, fever, and loss of appetite. Eyes:  Denies discharge. CV:  Denies chest pain or discomfort and palpitations. Resp:  Denies cough and wheezing. GI:  Denies abdominal pain, change in bowel habits, nausea, and vomiting. GU:  Complains of discharge, dysuria, and urinary frequency; denies genital sores. Derm:  Complains of itching; denies rash. Neuro:  Denies numbness and tingling.  Physical Exam  General:  overweight appearing female inNAD Head:  normocephalic, atraumatic, and no abnormalities observed.   Mouth:  pharynx pink and moist.   Neck:  supple with full rom and no masses or thyromegally, no JVD or carotid bruit  Lungs:  Normal respiratory effort, chest expands symmetrically. Lungs are clear to auscultation, no crackles or wheezes. Heart:  Normal rate and regular rhythm. S1 and S2 normal without gallop, murmur, click, rub or other extra sounds. Abdomen:  mild suprapubic tenderness without rebound or gaurding   Genitalia:  normal introitus and no external lesions.  scant d/c that is clear  to grey no odor  nl bimanual exam  Msk:  no CVA tenderness    Impression & Recommendations:  Problem # 1:  VAGINITIS (ICD-616.10) Assessment New recurrent BV tx with metrogel vaginal  that is already starting to work set up check up and gyn exam in [place] (pt thinks she still has cervix) also gon/ chl sent from urine for std screein in light of symptoms Her updated medication list for this problem includes:    Metrogel-vaginal 0.75 % Gel (Metronidazole) .[name]... 1 applicator intravaginally at bedtime for 10 days  Orders: T-Chlamydia  Probe, urine [id number]) T-GC Probe, urine [id number]) Specimen Handling (29562) UA Dipstick w/o Micro (manual) (81002) Wet Prep (13086VH)  Problem # 2:  FREQUENCY, URINARY (ICD-788.41) Assessment: New  with neg ua - suspect from the vaginitis  update if not imp with tx of above   Orders: UA Dipstick w/o Micro (manual) (81002) Wet Prep (84696EX)  Complete Medication List: 1)  Lumigan 0.03 % Soln (Bimatoprost) .... Use as directed 2)  Zocor 40 Mg Tabs (Simvastatin) .... Take 1/2 tablet by mouth once daily 3)  Metoprolol Tartrate 25 Mg Tabs (Metoprolol tartrate) .... Take one tablet two times a day 4)  Adult Aspirin Low Strength 81 Mg Tbdp (Aspirin) .... One by mouth daily 5)  Synthroid 50 Mcg Tabs (Levothyroxine sodium) .... One by mouth once daily 6)  Ambien 10 Mg Tabs (Zolpidem tartrate) .... One by mouth at bedtime 7)  Ramipril 5 Mg Caps (Ramipril) .[name].. 1 by mouth once daily 8)  Hydrochlorothiazide 25 Mg Tabs (Hydrochlorothiazide) .... 1/2 by mouth once daily 9)  Klor-con 10 10 Meq Cr-tabs (Potassium chloride) .... 1/2 tab once daily 10)  Allegra 180 Mg Tabs (Fexofenadine hcl) .... Take one by mouth daily as needed 11)  Protonix 40 Mg Tbec (Pantoprazole sodium) .... Take 1 tablet by mouth as needed 12)  Astelin 137 Mcg/spray Soln (Azelastine hcl) .... 2 sprays in each nostril twice daily 13)  Vitamin C 500 Mg Tabs (Ascorbic acid) .... Take 1 tablet by mouth once a day 14)  Advil 200 Mg Tabs (Ibuprofen) .... Otc as directed. 15)  Metrogel-vaginal 0.75 % Gel (Metronidazole) .[name].. 1 applicator intravaginally at bedtime for 10  days  Patient Instructions: 1)  try to eat a little yogurt every day to restore vaginal flora  2)  use the metrogel as directed for bacterial vaginitis  3)  schedule fasting labs in january and then f/u for PE/ gyn exam  Prescriptions: PROTONIX 40 MG TBEC (PANTOPRAZOLE SODIUM) Take 1 tablet by mouth as needed  #90 x 3   Entered and Authorized by:   Judith Part MD   Signed by:   Judith Part MD on 03/29/2010   Method used:   Print then Give to Patient   RxID:   [id number] RAMIPRIL 5 MG CAPS (RAMIPRIL) 1 by mouth once daily  #90 x 3   Entered and Authorized by:   Judith Part MD   Signed by:   Judith Part MD on 03/29/2010   Method used:   Print then Give to Patient   RxID:   [id number] ASTELIN 137 MCG/SPRAY SOLN (AZELASTINE HCL) 2 sprays in each nostril twice daily  #3 months x 3   Entered and Authorized by:   Judith Part MD   Signed by:   Judith Part MD on 03/29/2010   Method used:   Print then Give to Patient   RxID:   [id number] METROGEL-VAGINAL  0.75 % GEL (METRONIDAZOLE) 1 applicator intravaginally at bedtime for 10 days  #1 course x 0   Entered and Authorized by:   Judith Part MD   Signed by:   Judith Part MD on 03/29/2010   Method used:   [facility] to        [facility] (retail)       [address]       [place], [place]  [address]       Ph: [phone] or [phone]       Fax: [phone]   RxID:   [id number]   Current Allergies (reviewed today): ! ZOCOR CODEINE TEGRETOL * TAPE PAXIL ELAVIL LIPITOR * CRESTOR * ZYRTEC * CONTRAST DYE  Laboratory Results   Urine Tests  Date/Time Received: March 29, 2010 3:30 PM   Routine Urinalysis   Color: yellow Appearance: Hazy Glucose: negative   (Normal Range: Negative) Bilirubin: negative   (Normal Range: Negative) Ketone: negative   (Normal Range: Negative) Spec. Gravity: 1.020   (Normal Range: 1.003-1.035) Blood: trace-intact   (Normal  Range: Negative) pH: 6.5   (Normal Range: 5.0-8.0) Protein: trace   (Normal Range: Negative) Urobilinogen: 0.2   (Normal Range: 0-1) Nitrite: negative   (Normal Range: Negative) Leukocyte Esterace: trace   (Normal Range: Negative)  Urine Microscopic WBC/HPF: 0-1 RBC/HPF: 0 Bacteria/HPF: 0 Mucous/HPF: few Epithelial/HPF: 1-2 Crystals/HPF: few Casts/LPF: 0 Yeast/HPF: 0 Other: 0      Wet Mount Source: vaginal WBC/hpf: 5-10 Bacteria/hpf: 2+  Rods Clue cells/hpf: moderate  Negative whiff Yeast/hpf: none Wet Mount KOH: Negative Trichomonas/hpf: none      Laboratory Results   Urine Tests    Routine Urinalysis   Color: yellow Appearance: Hazy Glucose: negative   (Normal Range: Negative) Bilirubin: negative   (Normal Range: Negative) Ketone: negative   (Normal Range: Negative) Spec. Gravity: 1.020   (Normal Range: 1.003-1.035) Blood: trace-intact   (Normal Range: Negative) pH: 6.5   (Normal Range: 5.0-8.0) Protein: trace   (Normal Range: Negative) Urobilinogen: 0.2   (Normal Range: 0-1) Nitrite: negative   (Normal Range: Negative) Leukocyte Esterace: trace   (Normal Range: Negative)  Urine Microscopic WBC/hpf: 0-1 RBC/hpf: 0 Bacteria: 0 Mucous: few Epithelial: 1-2 Crystals/LPF: few Casts/LPF: 0 Yeast/HPF: 0 Other: 0      Wet Mount/KOH  Rods  Negative whiff KOH Negative

## 2010-09-19 NOTE — Progress Notes (Signed)
Summary: Nuc. Pre-Procedure  Phone Note Outgoing Call Call back at [facility] Phone [phone]   Call placed by: Irean Hong, RN,  May 02, 2010 10:58 AM Summary of Call: Reviewed information on [employer] Information Sheet (see scanned document for further details).  Spoke with patient.      Nuclear Med Background Indications for Stress Test: Evaluation for Ischemia   History: Defibrillator, Echo, Myocardial Perfusion Study, MVP  History Comments: '03 MPS: NL 4/11 Echo: NL Hx. Pulmonary sarcoidosis, NSVT.  Symptoms: Chest Pain, Fatigue  Symptoms Comments: Radiates to right side.   Nuclear Pre-Procedure Cardiac Risk Factors: Family History - CAD, History of Smoking, Hypertension, Lipids, TIA Height (in): 66  Nuclear Med Study Referring MD:  Nona Dell

## 2010-09-19 NOTE — Assessment & Plan Note (Signed)
Summary: has cold/ alc   Vital Signs:  Patient profile:   60 year old female Height:      66 inches Weight:      204.75 pounds BMI:     33.17 Temp:     97.9 degrees F oral Pulse rate:   68 / minute Pulse rhythm:   regular BP sitting:   100 / 68  (left arm) Cuff size:   regular  Vitals Entered By: Lewanda Rife LPN (October 18, 1608 12:42 PM)  History of Present Illness: started with cold symptoms - about 2 days ago  severe cough - esp when she lays down  used cough med from a friend -- ? promethazine in it  throat scratchy -and lots of sneezing  just started coughing up a bit - clear to yellowish  no fever - no chills or aches   ? wonders about allergies   no n/v/d   no otc meds - except halls throat losenges   takes vit C every day   last night noticed some urine symptoms  discomfort in her bladder  a little burning  no odor or blood in urine  no new back pain  a little bit of frequency   is drinking lots of OJ   has a lab order from [facility]   lastly - needs copies of last cbc and comp met and /d level sent to her neurologist and rheum  Allergies: 1)  ! Zocor 2)  Codeine 3)  Tegretol 4)  * Tape 5)  Paxil 6)  Elavil 7)  Lipitor 8)  * Crestor 9)  * Zyrtec 10)  * Contrast Dye  Past History:  Past Surgical History: Last updated: 01/03/2009 Hysterectomy, partial- fibroids Aneurysm surgery (02/1999)- cerebral  Bilateral knee arthroscopy Adenosine cardiolite- neg. EF 50% (07/20/2002) Carotid doppler- negative (10/2004) Admit- GI bleed, PUD/ diverticulosis ( EGD/ colonoscopy)  (03/2003) PE- hosp./ defib.(nonsustained v tach) ABI's- normal (06/2005) ICD - Guidant   Family History: Last updated: 09/07/2009 mother DM brother DM Family History of Cardiomyopathy:  brother - thyroid problem   allergies: sister heart disease: sister, brother, father cancer: maternal grandmother (colon) maternal grandfather (lung)   Social History: Last updated:  05/19/2009 Patient is a former smoker.  started at age 55.  less than 1 ppd.  quit 1993 rare alcoholic beverage Retired from [employer] pt has 2 Children Lives alone pt is divorced.   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 Eyes:  Denies discharge and eye irritation. ENT:  Complains of earache, nasal congestion, postnasal drainage, and sore throat; denies sinus pressure. CV:  Denies chest pain or discomfort and palpitations. Resp:  Complains of cough and sputum productive; denies pleuritic, shortness of breath, and wheezing. GI:  Denies abdominal pain, diarrhea, nausea, and vomiting. GU:  Complains of dysuria and urinary frequency; denies hematuria, incontinence, and nocturia. Derm:  Denies itching and rash. Neuro:  Denies headaches.  Physical Exam  General:  overweight appearing female inNAD Head:  normocephalic, atraumatic, and no abnormalities observed.  no sinus tenderness  Eyes:  vision grossly intact, pupils equal, pupils round, pupils reactive to light, and no injection.   Ears:  R ear normal and L ear normal.   Nose:  nares are  injected and congested bilaterally  some clear rhinorrhea  Mouth:  pharynx pink and moist, no erythema, and no exudates.   Neck:  supple with full rom and no masses or thyromegally, no JVD or carotid bruit  Lungs:  CTA with harsh bs at bases good air exch no rales/rhonchi/ wheeze or sob Heart:  Normal rate and regular rhythm. S1 and S2 normal without gallop, murmur, click, rub or other extra sounds. Abdomen:  no suprapubic tenderness or fullness felt  Msk:  no CVA tenderness  Extremities:  No clubbing, cyanosis, edema, or deformity noted with normal full range of motion of all joints.   Skin:  Intact without suspicious lesions or rashes Cervical Nodes:  No lymphadenopathy noted Inguinal Nodes:  No significant adenopathy Psych:  normal affect, talkative and pleasant    Impression & Recommendations:  Problem # 1:  URI (ICD-465.9) Assessment New  viral with head and chest congestion will try tessalon for cough as needed  recommend sympt care- see pt instructions   pt advised to update me if symptoms worsen or do not improve - esp if inc prod cough Her updated medication list for this problem includes:    Adult Aspirin Low Strength 81 Mg Tbdp (Aspirin) ..... One by mouth daily    Allegra 180 Mg Tabs (Fexofenadine hcl) .[name]... Take one by mouth daily as needed    Tessalon 200 Mg Caps (Benzonatate) .[name]... 1 by mouth up to three times a day as needed cough  Orders: Prescription Created Electronically [phone])  Problem # 2:  DYSURIA (ICD-788.1) Assessment: New  with completely nl ua and no other symptoms suspect this may be from acidic urine from heavy oj consumption yesterday urged to inc water and dec acidic bev and update me if worse or not imp  Orders: UA Dipstick W/ Micro (manual) (60454)  Complete Medication List: 1)  Lumigan 0.03 % Soln (Bimatoprost) .... Use as directed 2)  Zocor 40 Mg Tabs (Simvastatin) .... Take 1/2 tablet by mouth once daily 3)  Metoprolol Tartrate 50 Mg  Tabs (Metoprolol tartrate) .... Take one tablet twice daily 4)  Adult Aspirin Low Strength 81 Mg Tbdp (Aspirin) .... One by mouth daily 5)  Synthroid 50 Mcg Tabs (Levothyroxine sodium) .... One by mouth once daily 6)  Ambien 10 Mg Tabs (Zolpidem tartrate) .... One by mouth at bedtime 7)  Ramipril 5 Mg Caps (Ramipril) .[name].. 1 by mouth once daily 8)  Hydrochlorothiazide 25 Mg Tabs (Hydrochlorothiazide) .... 1/2 by mouth once daily 9)  Klor-con 10 10 Meq Cr-tabs (Potassium chloride) .... 1/2 tab once daily 10)  Allegra 180 Mg Tabs (Fexofenadine hcl) .... Take one by mouth daily as needed 11)  Nasonex 50 Mcg/act Susp (Mometasone furoate) .... 2 sprays in each nostril once daily as needed 12)  Protonix 40 Mg Tbec (Pantoprazole sodium) .... Take 1 tablet by mouth as needed 13)  Astelin 137 Mcg/spray Soln (Azelastine hcl) .... 2 sprays in each nostril twice daily 14)  Tessalon 200 Mg Caps (Benzonatate) .[name].. 1 by mouth up to three times a day as needed cough  Patient Instructions: 1)  you can try mucinex over the  counter twice daily as directed and nasal saline spray for congestion 2)  tylenol over the counter as directed may help with aches, headache and fever 3)  try the tessalon for cough as needed  4)  call if symptoms worsen or if not improved in 4-5 days  5)  urine is clear today 6)  push the water -- and avoid acidic beverages  Prescriptions: TESSALON 200 MG CAPS (BENZONATATE) 1 by mouth up to three times a day as needed cough  #30 x 0   Entered and Authorized by:   Judith Part MD   Signed by:   Judith Part MD on 10/18/2009   Method used:   [facility]        [facility]  [address] (ret[facility])       [address]       [place], [place]  [address]       Ph: [phone] or [phone]       Fax: [phone]   RxID:   [id number]   Current Allergies (reviewed today): ! ZOCOR CODEINE TEGRETOL * TAPE PAXIL ELAVIL LIPITOR * CRESTOR * ZYRTEC * CONTRAST  DYE  Laboratory Results   Urine Tests  Date/Time Received: October 18, 2009 12:43 PM  Date/Time Reported: October 18, 2009 12:43 PM   Routine Urinalysis   Color: yellow Appearance: Clear Glucose: negative   (Normal Range: Negative) Bilirubin: negative   (Normal Range: Negative) Ketone: negative   (Normal Range: Negative) Spec. Gravity: 1.015   (Normal Range: 1.003-1.035) Blood: negative   (Normal Range: Negative) pH: 6.0   (Normal Range: 5.0-8.0) Protein: trace   (Normal Range: Negative) Urobilinogen: 0.2   (Normal Range: 0-1) Nitrite: negative   (Normal Range: Negative) Leukocyte Esterace: negative   (Normal Range: Negative)

## 2010-09-19 NOTE — Assessment & Plan Note (Signed)
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 She comes in today because her defibrillator is beating..  The patient denies SOB, chest pain, edema or palpitations  Is a significant amount of recent stress related to the hospitalization of her mom; she is currently intubated in [place]    She has also undergone outpatient therapy for Dr. Shon Hough for solution of the keloid related to her ICD insertion.  There has been no intercurrent discharges; there but no appropriate therapies at all.   Current Medications (verified): 1)  Lumigan 0.03 %  Soln (Bimatoprost) .... Use As Directed 2)  Zocor 40 Mg  Tabs (Simvastatin) .... Take 1/2 Tablet By Mouth Once Daily 3)  Metoprolol Tartrate 25 Mg Tabs (Metoprolol Tartrate) .... Take One Tablet Two Times A Day 4)  Adult Aspirin Low Strength 81 Mg  Tbdp (Aspirin) .... One By Mouth Daily 5)  Synthroid 50 Mcg  Tabs (Levothyroxine Sodium) .... One By Mouth Once Daily 6)  Ambien 10 Mg  Tabs (Zolpidem Tartrate) .... One By Mouth At Bedtime 7)  Ramipril 5 Mg Caps (Ramipril) .[name].. 1 By Mouth Once Daily 8)  Hydrochlorothiazide 25 Mg  Tabs (Hydrochlorothiazide) .... 1/2 By Mouth Once Daily 9)  Klor-Con 10 10 Meq Cr-Tabs (Potassium Chloride) .... 1/2 Tab Once Daily 10)  Allegra 180 Mg  Tabs (Fexofenadine Hcl) .... Take One By Mouth Daily As Needed 11)  Protonix 40 Mg Tbec (Pantoprazole Sodium) .... Take 1 Tablet By Mouth As Needed 12)  Astelin 137 Mcg/spray Soln (Azelastine Hcl) .... 2 Sprays in Each Nostril Twice Daily  Allergies (verified): 1)  ! 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) PATENT FORAMEN OVALE (ICD-745.5) TIA (ICD-435.9) TAKOTSUBO SYNDROME (ICD-429.83) UNSPECIFIED OTALGIA (ICD-388.70) FATIGUE (ICD-780.79) UNSPECIFIED VITAMIN D DEFICIENCY (ICD-268.9) HEADACHE (ICD-784.0) Hosp for TACHYCARDIA (ICD-785.0) Hx of PULMONARY EMBOLISM (ICD-415.19) MITRAL VALVE PROLAPSE (ICD-424.0) FATTY LIVER DISEASE (ICD-571.8) 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 EF 50% (07/20/2002) Carotid doppler- negative (10/2004) Admit- GI bleed, PUD/ diverticulosis ( EGD/ colonoscopy)  (03/2003) PE- hosp./ defib.(nonsustained v tach) ABI's- normal (06/2005) ICD - Guidant   Family History: Last updated: 09/07/2009 mother DM brother DM Family History of Cardiomyopathy:  brother - thyroid problem   allergies: sister heart disease: sister, brother, father cancer: maternal grandmother (colon) maternal grandfather (lung)   Social History: Last updated: 05/19/2009 Patient is a former smoker.  started at age 53.  less than 1 ppd.  quit 1993 rare alcoholic beverage Retired from [employer] pt has 2 Children Lives alone pt is divorced.   Vital Signs:  Patient profile:   60 year old female Height:      66 inches Weight:      210 pounds BMI:  34.02 Pulse rate:   78 / minute Pulse rhythm:   regular BP sitting:   114 / 78  (left arm) Cuff size:   regular  Vitals Entered By: Judithe Modest CMA (November 24, 2009 2:56 PM)  Physical  Exam  General:  The patient was alert and oriented in no acute distress. HEENT Normal.  Neck veins were flat, carotids were brisk.  Lungs were clear.  Heart sounds were regular without murmurs or gallops.  Abdomen was soft with active bowel sounds. There is no clubbing cyanosis or edema. Skin Warm and dry keloid is largely resolved; device pocket is well-healed    ICD Specifications Following MD:  Sherryl Manges, MD     ICD Vendor:  [facility] [employer]     ICD Model Number:  T135     ICD Serial Number:  [id number] ICD DOI:  01/21/2004     ICD Implanting MD:  Sherryl Manges, MD  Lead 1:    Location: RV     DOI: 01/21/2004     Model #: [id number]     Serial #: [id number]     Status: active  Indications::  SARCOID, NSVT   ICD Follow Up ICD Dependent:  No      Brady Parameters Mode VVI     Lower Rate Limit:  40      Tachy Zones VF:  240     VT:  210     VT1:  170 (MONITOR)     Impression & Recommendations:  Problem # 1:  ICD - IN SITU (ICD-V45.02) Device parameters and data were reviewed and no changes were made   ERI was inactivated. Amplitude is 10.8 impedance is 587 and high-voltage impedance of 47 ohms  Kilby to undergo device generator replacement. We have reviewed the potential benefits as well as risks including but not limited to infection and keloid. I have reviewed this withwith Dr. Shon Hough;  he would encourage [place] to use the silicone impregnated mesh. He actually uses it underneath the Steri-Strips.  Will plan to delay this for about a month or so while her mother is so sick in [place]. J. 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 Take one tablet two times a day    Adult Aspirin Low Strength 81 Mg Tbdp (Aspirin) ..... One by mouth daily    Ramipril 5 Mg Caps (Ramipril) .[name]... 1 by mouth once daily  Orders: Echocardiogram (Echo)  Patient Instructions: 1)  Your physician has requested that you have an echocardiogram.  Echocardiography is a painless test that uses sound waves to create images of your heart. It provides your doctor with information about the size and shape of your heart and how well your heart's chambers and valves are working.  This procedure takes approximately one hour. There are no restrictions for this procedure. 2)  We will contact you about scheduling a generator replacement on your ICD in about 4 weeks.

## 2010-09-19 NOTE — Cardiovascular Report (Signed)
Summary: Pre-Op Orders  Pre-Op Orders   Imported By: Marylou Mccoy 01/25/2010 11:16:57  _____________________________________________________________________  External Attachment:    Type:   Image     Comment:   External Document

## 2010-09-19 NOTE — Assessment & Plan Note (Signed)
Summary: RUNNING FEVER and HEAD PAIN WHEN COUGHING and BODY ACHES / LFW   Vital Signs:  Patient profile:   60 year old female Height:      66 inches Weight:      212.25 pounds BMI:     34.38 Temp:     98.7 degrees F oral Pulse rate:   80 / minute Pulse rhythm:   regular BP sitting:   112 / 70  (left arm) Cuff size:   large  Vitals Entered By: Lewanda Rife LPN (April 12, 2010 3:06 PM) CC: Fever, hed hurts when coughs, non productive cough and body aches.   History of Present Illness: had sore throat about 2 weeks ago-- went away then started nausea and chills with fever and cough (temp max 100) is sore to cough -- makes head and chest and head and back hurt   is not coughing much up -- a little phlegm in throat   has been taking tylenol and some advil every bone in her body ached   is more fever at night than during the day  no vomiting  waves of nausea no diarrhea -- is in fact constipated   no tick bites known  did mow grass last week  no rash   did have a red spot on leg and went away 1 hour later    Allergies: 1)  ! 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) UNSPECIFIED OTALGIA (ICD-388.70) FATIGUE (ICD-780.79) UNSPECIFIED VITAMIN D DEFICIENCY (ICD-268.9) HEADACHE (ICD-784.0) Hosp for TACHYCARDIA (ICD-785.0) Hx of PULMONARY EMBOLISM (ICD-415.19) MITRAL VALVE PROLAPSE (ICD-424.0) FATTY LIVER DISEASE (ICD-571.8) Hx of FIBROMYALGIA (ICD-729.1) 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 EF 50% (07/20/2002) Carotid doppler- negative (10/2004) Admit- GI bleed, PUD/ diverticulosis ( EGD/ colonoscopy)  (03/2003) PE- hosp./ defib.(nonsustained v tach) ABI's- normal (06/2005) ICD - Guidant   Family History: Last updated: 09/07/2009 mother DM brother DM Family History of Cardiomyopathy:  brother - thyroid problem   allergies: sister heart disease: sister, brother, father cancer: maternal grandmother (colon) maternal grandfather (lung)   Social History: Last updated: 05/19/2009 Patient is a former smoker.  started at age 39.  less than 1 ppd.  quit 1993 rare alcoholic beverage Retired from [employer] pt has 2 Children Lives alone pt is divorced.   Risk Factors: Smoking Status: quit (11/18/2006)  Review of Systems General:  Complains of chills, fatigue, fever, and malaise. Eyes:  Denies blurring and eye irritation. ENT:  Complains of postnasal drainage and sore throat; denies earache. CV:  Denies chest pain or discomfort and palpitations. Resp:  Complains of cough and sputum productive; denies wheezing. GI:  Denies abdominal pain, bloody stools, indigestion, nausea, and vomiting. GU:  Denies dysuria, hematuria, and urinary frequency. MS:  Denies joint redness and joint swelling. Derm:  Denies itching, lesion(s), poor wound healing, and rash. Neuro:  Denies numbness and tingling. Endo:  Denies excessive thirst and excessive urination. Heme:  Denies abnormal bruising, bleeding, and enlarge lymph nodes.  Physical Exam  General:  overweight appearing female inNAD Head:  normocephalic, atraumatic, and no abnormalities observed.  no sinus tenderness Eyes:  vision grossly intact, pupils equal, pupils round, pupils reactive to  light, and no injection.   Ears:  R ear normal and L ear normal.   Nose:  nares are boggy but clear  Mouth:  pharynx pink and moist, no erythema, and no exudates.   Neck:  supple with full rom and no masses or thyromegally, no JVD or carotid bruit  Lungs:  good air exch- no wheeze  faint rales heard at L base  no rhonchi   Heart:  Normal rate and regular rhythm. S1 and S2 normal without gallop, murmur, click, rub or other extra sounds. Abdomen:  Bowel sounds positive,abdomen soft and non-tender without masses, organomegaly or hernias noted. Skin:  Intact without suspicious lesions or rashes Cervical Nodes:  No lymphadenopathy noted Inguinal Nodes:  No significant adenopathy Psych:  normal affect, talkative and pleasant    Impression & Recommendations:  Problem # 1:  COUGH (ICD-786.2) Assessment New cough and fever- with faint rales heard at L base hx of sarcoid check cxr  cover with zithromax - and update recommend sympt care- see pt instructions   pt advised to update me if symptoms worsen or do not improve  Orders: T-2 View CXR ([id number]) Prescription Created Electronically [id number])  Complete Medication List: 1)  Lumigan 0.03 % Soln (Bimatoprost) .... Use as directed 2)  Zocor 40 Mg Tabs (Simvastatin) .... Take 1/2 tablet by mouth once daily 3)  Metoprolol Tartrate 25 Mg Tabs (Metoprolol tartrate) .... Take one tablet two times a day 4)  Adult Aspirin Low Strength 81 Mg Tbdp (Aspirin) .... One by mouth daily 5)  Synthroid 50 Mcg Tabs (Levothyroxine sodium) .... One by mouth once daily 6)  Ambien 10 Mg Tabs (Zolpidem tartrate) .... One by mouth at bedtime 7)  Ramipril 5 Mg Caps (Ramipril) .[name].. 1 by mouth once daily 8)  Hydrochlorothiazide 25 Mg Tabs (Hydrochlorothiazide) .... 1/2 by mouth once daily 9)  Klor-con 10 10 Meq Cr-tabs (Potassium chloride) .... 1/2 tab once daily 10)  Allegra 180 Mg Tabs (Fexofenadine hcl) .... Take one by mouth daily as needed 11)  Protonix 40 Mg  Tbec (Pantoprazole sodium) .... Take 1 tablet by mouth as needed 12)  Astelin 137 Mcg/spray Soln (Azelastine hcl) .... 2 sprays in each nostril twice daily 13)  Vitamin C 500 Mg Tabs (Ascorbic acid) .... Take 1 tablet by mouth once a day 14)  Advil 200 Mg Tabs (Ibuprofen) .... Otc as directed. 15)  Tylenol Extra Strength 500 Mg Tabs (Acetaminophen) .... Otc as directed. 16)  Benadryl 25 Mg Caps (Diphenhydramine hcl) .... Otc as directed. 17)  Zithromax Z-pak 250 Mg Tabs (Azithromycin) .... Take by mouth as directed  Patient Instructions: 1)  take mucinex DM for cough and congestion  2)  drink lots of fluids  3)  tylenol for fever as needed  4)  chest x ray on way out  5)  if high fever or short of breath- go to ER 6)  I will update you tomorrow about chest x ray Prescriptions: ZITHROMAX Z-PAK 250 MG TABS (AZITHROMYCIN) take by mouth as directed  #1 pack x 0   Entered and Authorized by:   Judith Part MD   Signed by:   Judith Part MD on 04/12/2010   Method used:   Electronically  to        [facility]  [address] (retail)       [address]       [place], [place]  [address]       Ph: [phone] or [phone]       Fax: [phone]   RxID:   [id number]   Current Allergies (reviewed today): ! ZOCOR CODEINE TEGRETOL * TAPE PAXIL ELAVIL LIPITOR * CRESTOR * ZYRTEC * CONTRAST DYE

## 2010-09-19 NOTE — Letter (Signed)
Summary: [facility]  [facility]   Imported By: Lanelle Bal 10/14/2009 10:42:50  _____________________________________________________________________  External Attachment:    Type:   Image     Comment:   External Document

## 2010-09-19 NOTE — Procedures (Signed)
Summary: wound check/jml   Current Medications (verified): 1)  Lumigan 0.03 %  Soln (Bimatoprost) .... Use As Directed 2)  Zocor 40 Mg  Tabs (Simvastatin) .... Take 1/2 Tablet By Mouth Once Daily 3)  Metoprolol Tartrate 25 Mg Tabs (Metoprolol Tartrate) .... Take One Tablet Two Times A Day 4)  Adult Aspirin Low Strength 81 Mg  Tbdp (Aspirin) .... One By Mouth Daily 5)  Synthroid 50 Mcg  Tabs (Levothyroxine Sodium) .... One By Mouth Once Daily 6)  Ambien 10 Mg  Tabs (Zolpidem Tartrate) .... One By Mouth At Bedtime 7)  Ramipril 5 Mg Caps (Ramipril) .[name].. 1 By Mouth Once Daily 8)  Hydrochlorothiazide 25 Mg  Tabs (Hydrochlorothiazide) .... 1/2 By Mouth Once Daily 9)  Klor-Con 10 10 Meq Cr-Tabs (Potassium Chloride) .... 1/2 Tab Once Daily 10)  Allegra 180 Mg  Tabs (Fexofenadine Hcl) .... Take One By Mouth Daily As Needed 11)  Protonix 40 Mg Tbec (Pantoprazole Sodium) .... Take 1 Tablet By Mouth As Needed 12)  Astelin 137 Mcg/spray Soln (Azelastine Hcl) .... 2 Sprays in Each Nostril Twice Daily  Allergies (verified): 1)  ! Zocor 2)  Codeine 3)  Tegretol 4)  * Tape 5)  Paxil 6)  Elavil 7)  Lipitor 8)  * Crestor 9)  * Zyrtec 10)  * Contrast Dye   ICD Specifications Following MD:  Sherryl Manges, MD     ICD Vendor:  [facility] [employer]     ICD Model Number:  E100     ICD Serial Number:  [id number] ICD DOI:  12/22/2009     ICD Implanting MD:  Sherryl Manges, MD  Lead 1:    Location: RV     DOI: 01/21/2004     Model #: [id number]     Serial #: [id number]     Status: active  Indications::  SARCOID, NSVT   ICD Follow Up Battery Voltage:  good V     Charge Time:  8.3 seconds     Battery Est. Longevity:  9 yrs Underlying rhythm:  sr ICD Dependent:  No       ICD Device Measurements Right Ventricle:  Amplitude: 13.3 mV, Impedance: 580 ohms, Threshold: 0.5 V at 0.4 msec Shock Impedance: 51 ohms   Episodes MS Episodes:  0     Percent Mode Switch:  0     Shock:  0     ATP:  0     Nonsustained:  0     Atrial  Therapies:  0 Ventricular Pacing:  <1%  Brady Parameters Mode VVI     Lower Rate Limit:  40      Tachy Zones VF:  240     VT:  210     VT1:  170 (MONITOR)     Next Cardiology Appt Due:  03/20/2010 Tech Comments:  Normal device function.  No changes made.  Pt doesnt want to be enrolled in [employer]--in ofc checks only.  [name]  Jan 02, 2010 11:51 AM

## 2010-09-19 NOTE — Letter (Signed)
Summary: [facility]  [facility]   Imported By: Sherian Rein 05/12/2010 08:23:55  _____________________________________________________________________  External Attachment:    Type:   Image     Comment:   External Document

## 2010-09-19 NOTE — Progress Notes (Signed)
Summary: Pacemaker beeping.  Phone Note Call from Patient Call back at [phone]   Caller: Patient Summary of Call: Pt said she hear her pacemaker beeping, no pains. Initial call taken by: Judie Grieve,  November 23, 2009 9:05 AM  Follow-up for Phone Call        Phone Call Completed. Pt will see Dr Graciela Husbands on 11-24-09 @ 2:00PM. Pt advised she is an add-on and there may be a wait. Pt verbalizes understanding.  Follow-up by: Bernita Raisin, RN, BSN,  November 23, 2009 10:23 AM

## 2010-09-19 NOTE — Assessment & Plan Note (Signed)
Summary: cpx/tsc   Vital Signs:  Patient profile:   60 year old female Height:      66 inches Weight:      207 pounds BMI:     33.53 Temp:     97.7 degrees F oral Pulse rate:   64 / minute Pulse rhythm:   regular BP sitting:   116 / 74  (left arm) Cuff size:   regular  Vitals Entered By: Lowella Petties CMA (September 07, 2009 8:10 AM) CC: 30 minute check up   History of Present Illness: here to disc chronic health problems and review health mt list  is feeling good overall   wt is stable with bmi of 33  bp good 116/74- HTN has been well controled   hx of fatty liver - recent transaminases ok   lipids- fairly well controlled with trig 110 ND HDL 51 and LDL 91 diet is not as good as it was  is getting ready to exercise again -- got off track with schedule and that is troublesome  no time to take care of herself   hyst in past/ last pap 03 occ gets a little odor and d/c-- but not now  occ uses otc yeast prep   mam 12/10-- just had one recently self exam - no lumps or problems  does get occ pain in L breast -- is sharp occ when she touches it  going on for a whle   utd flu and pneumovax shingles status Td - is due for   colon cancer screen --last colonosc was about 4 years ago   bone density-has never had a test  ? mother with bone loss wants to put dexa for now- not interested  is taking ca and vit D  vit D level imp this summer      is generally tired -- ? if it was her thyroid , or from her cardiac medicine   Allergies: 1)  ! 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) UNSPECIFIED OTALGIA (ICD-388.70) FATIGUE (ICD-780.79) UNSPECIFIED  VITAMIN D DEFICIENCY (ICD-268.9) HEADACHE (ICD-784.0) Hosp for TACHYCARDIA (ICD-785.0) Hx of PULMONARY EMBOLISM (ICD-415.19) MITRAL VALVE PROLAPSE (ICD-424.0) FATTY LIVER DISEASE (ICD-571.8) Hx of FIBROMYALGIA (ICD-729.1) 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 EF 50% (07/20/2002) Carotid doppler- negative (10/2004) Admit- GI bleed, PUD/ diverticulosis ( EGD/ colonoscopy)  (03/2003) PE- hosp./ defib.(nonsustained v tach) ABI's- normal (06/2005) ICD - Guidant   Family History: Last updated: 09/07/2009 mother DM brother DM Family History of Cardiomyopathy:  brother - thyroid problem   allergies: sister heart disease: sister, brother, father cancer: maternal grandmother (colon) maternal grandfather (lung)   Social History: Last updated: 05/19/2009 Patient is a former smoker.  started at age 9.  less than 1 ppd.  quit 1993 rare alcoholic beverage Retired from [employer] pt has 2 Children Lives alone pt is divorced.   Risk Factors: Smoking Status: quit (11/18/2006)  Family History: mother DM brother DM Family History of Cardiomyopathy:  brother - thyroid problem   allergies: sister heart disease: sister, brother, father cancer: maternal grandmother (colon) maternal grandfather (lung)   Review of Systems General:  Complains of fatigue; denies chills, fever, loss of appetite, and malaise. Eyes:  Denies blurring and eye irritation. ENT:  Denies sinus pressure and sore throat. CV:  Denies chest pain or discomfort, palpitations, shortness of breath with exertion, and swelling of feet. Resp:  Denies cough and  wheezing. GI:  Denies abdominal pain, change in bowel habits, and indigestion. GU:  Denies abnormal vaginal bleeding, discharge, and dysuria. MS:  Denies muscle aches and cramps. Derm:  Denies itching, lesion(s), poor wound healing, and rash. Neuro:  Denies numbness, tingling, and weakness. Psych:  mood is ok . Endo:  Denies cold intolerance, excessive thirst, and excessive urination. Heme:  Denies abnormal bruising and bleeding.  Physical Exam  General:  overweight appearing female inNAD Head:  normocephalic, atraumatic, and no abnormalities observed.  no sinus tenderness  Eyes:  vision grossly intact, pupils equal, pupils round, and pupils reactive to light.  no conjunctival pallor, injection or icterus  Ears:  R ear normal and L ear normal.   Nose:  no nasal discharge.   Mouth:  pharynx pink and moist.   Neck:  supple with full rom and no masses or thyromegally, no JVD or carotid bruit  Chest Wall:  No deformities, masses, or tenderness noted. Breasts:  No mass, nodules, thickening, tenderness, bulging, retraction, inflamation, nipple discharge or skin changes noted.   Lungs:  CTA with good air exch  harsh bs at bases without crackles or rales no wheeze  Heart:  Normal rate and regular rhythm. S1 and S2 normal without gallop, murmur, click, rub or other extra sounds. Abdomen:  Bowel sounds positive,abdomen soft and non-tender without masses, organomegaly or hernias noted. Msk:  No deformity or scoliosis noted of thoracic or lumbar spine.  no actue joint changes  Pulses:  R and L carotid,radial,femoral,dorsalis pedis and posterior tibial pulses are full and equal bilaterally Extremities:  No clubbing, cyanosis, edema, or deformity noted with normal full range of motion of all joints.   Neurologic:  sensation intact to light touch, gait normal, and DTRs symmetrical and normal.   Skin:  Intact without suspicious lesions or rashes Cervical Nodes:  No lymphadenopathy noted Axillary  Nodes:  No palpable lymphadenopathy Inguinal Nodes:  No significant adenopathy Psych:  normal affect, talkative and pleasant    Impression & Recommendations:  Problem # 1:  HYPOTHYROIDISM (ICD-244.9) Assessment Unchanged  no clinical changes and stable tsh med refilled for year  Her updated medication list for this problem includes:    Synthroid 50 Mcg Tabs (Levothyroxine sodium) ..... One by mouth once daily  Labs Reviewed: TSH: 1.61 (08/24/2009)    HgBA1c: 5.5 (09/03/2006) Chol: 164 (08/24/2009)   HDL: 51.00 (08/24/2009)   LDL: 91 (08/24/2009)   TG: 110.0 (08/24/2009)  Problem # 2:  HYPERTENSION (ICD-401.9) Assessment: Unchanged  bp is very well controlled on current med  no changes  lab reviewed  med refil Her updated medication list for this problem includes:    Metoprolol Tartrate 50 Mg Tabs (Metoprolol tartrate) .[name]... Take one tablet twice daily    Ramipril 5 Mg Caps (Ramipril) .[name]... 1 by mouth once daily    Hydrochlorothiazide 25 Mg Tabs (Hydrochlorothiazide) .[name]... 1/2 by mouth once daily  BP today: 116/74 Prior BP: 108/68 (08/02/2009)  Labs Reviewed: K+: 4.1 (08/24/2009) Creat: : 0.8 (08/24/2009)   Chol: 164 (08/24/2009)  HDL: 51.00 (08/24/2009)   LDL: 91 (08/24/2009)   TG: 110.0 (08/24/2009)  Problem # 3:  HYPERLIPIDEMIA (ICD-272.4) Assessment: Unchanged  cholesterol is very well controlled no change  med ref rev low sat fat diet  Her updated medication list for this problem includes:    Zocor 40 Mg Tabs (Simvastatin) .[name]... Take 1/2 tablet by mouth once daily  Labs Reviewed: SGOT: 31 (08/24/2009)   SGPT: 30 (08/24/2009)   HDL:51.00 (08/24/2009), 53.30 (03/04/2009)  LDL:91 (08/24/2009), 89 (03/04/2009)  Chol:164 (08/24/2009), 157 (03/04/2009)  Trig:110.0 (08/24/2009), 73.0 (03/04/2009)  Problem # 4:  Preventive Health Care (ICD-V70.0) Assessment: Comment Only Td today  Problem # 5:  UNSPECIFIED VITAMIN D DEFICIENCY (ICD-268.9) Assessment: Comment  Only  much imp with suppl in summer -urged to continue current dose  pt declines dexa yet - disc risk factors rev ca and vit D intake  Orders: TD Toxoids IM 7 YR + (16109)  Complete Medication List: 1)  Lumigan 0.03 % Soln (Bimatoprost) .... Use as directed 2)  Zocor 40 Mg Tabs (Simvastatin) .... Take 1/2 tablet by mouth once daily 3)  Metoprolol Tartrate 50 Mg Tabs (Metoprolol tartrate) .... Take one tablet twice daily 4)  Adult Aspirin Low Strength 81 Mg Tbdp (Aspirin) .... One by mouth daily 5)  Synthroid 50 Mcg Tabs (Levothyroxine sodium) .... One by mouth once daily 6)  Ambien 10 Mg Tabs (Zolpidem tartrate) .... One by mouth at bedtime 7)  Ramipril 5 Mg Caps (Ramipril) .[name].. 1 by mouth once daily 8)  Hydrochlorothiazide 25 Mg Tabs (Hydrochlorothiazide) .... 1/2 by mouth once daily 9)  Klor-con 10 10 Meq Cr-tabs (Potassium chloride) .... 1/2 tab once daily 10)  Allegra 180 Mg Tabs (Fexofenadine hcl) .... Take one by mouth daily as needed 11)  Nasonex 50 Mcg/act Susp (Mometasone furoate) .... 2 sprays in each nostril once daily as needed 12)  Protonix 40 Mg Tbec (Pantoprazole sodium) .... Take 1 tablet by mouth as needed 13)  Astelin 137 Mcg/spray Soln (Azelastine hcl) .... 2 sprays in each nostril twice daily  Other Orders: Admin 1st Vaccine (60454) Admin 1st Vaccine Horticulturist, commercial) [phone])  Patient Instructions: 1)  if you are interested in shingles vaccine in future- check with your insurance and then call us to schedule  2)  tetnus shot today  3)  please send for last colonoscopy Dr Randa Evens  4)  if you change your mind about a bone density test in the future- please let me know  5)  cholesterol and other labs are stable  6)  work on getting back to healthy diet and exercise  Prescriptions: ASTELIN 137 MCG/SPRAY SOLN (AZELASTINE HCL) 2 sprays in each nostril twice daily  #3 mdi x 3   Entered and Authorized by:   Judith Part MD   Signed by:   Judith Part MD on 09/07/2009    Method used:   Print then Give to Patient   RxID:   [id number] PROTONIX 40 MG TBEC (PANTOPRAZOLE SODIUM) Take 1 tablet by mouth as needed  #90 x 3   Entered and Authorized by:   Judith Part MD   Signed by:   Judith Part MD on 09/07/2009   Method used:   Print then Give to Patient   RxID:   [id number] NASONEX 50 MCG/ACT  SUSP (MOMETASONE FUROATE) 2 sprays in each nostril once daily as needed  #3 mdi x 3   Entered and Authorized by:   Judith Part MD  Signed by:   Judith Part MD on 09/07/2009   Method used:   Print then Give to Patient   RxID:   [id number] ALLEGRA 180 MG  TABS (FEXOFENADINE HCL) take one by mouth daily as needed  #90 x 3   Entered and Authorized by:   Judith Part MD   Signed by:   Judith Part MD on 09/07/2009   Method used:   Print then Give to Patient   RxID:   [id number] KLOR-CON 10 10 MEQ CR-TABS (POTASSIUM CHLORIDE) 1/2 tab once daily  #45 x 3   Entered and Authorized by:   Judith Part MD   Signed by:   Judith Part MD on 09/07/2009   Method used:   Print then Give to Patient   RxID:   [id number] HYDROCHLOROTHIAZIDE 25 MG  TABS (HYDROCHLOROTHIAZIDE) 1/2 by mouth once daily  #45 x 3   Entered and Authorized by:   Judith Part MD   Signed by:   Judith Part MD on 09/07/2009   Method used:   Print then Give to Patient   RxID:   [id number] RAMIPRIL 5 MG CAPS (RAMIPRIL) 1 by mouth once daily  #90 x 3   Entered and Authorized by:   Judith Part MD   Signed by:   Judith Part MD on 09/07/2009   Method used:   Print then Give to Patient   RxID:   [id number] SYNTHROID 50 MCG  TABS (LEVOTHYROXINE SODIUM) one by mouth once daily  #90 x 3   Entered and Authorized by:   Judith Part MD   Signed by:   Judith Part MD on 09/07/2009   Method used:   Print then Give to Patient   RxID:   [id number] METOPROLOL TARTRATE 50 MG TABS (METOPROLOL TARTRATE) Take one tablet twice daily  #180  x 3   Entered and Authorized by:   Judith Part MD   Signed by:   Judith Part MD on 09/07/2009   Method used:   Print then Give to Patient   RxID:   [id number] ZOCOR 40 MG  TABS (SIMVASTATIN) take 1/2 tablet by mouth once daily  #45 x 3   Entered and Authorized by:   Judith Part MD   Signed by:   Judith Part MD on 09/07/2009   Method used:   Print then Give to Patient   RxID:   [id number]   Prior Medications (reviewed today): LUMIGAN 0.03 %  SOLN (BIMATOPROST) use as directed ZOCOR 40 MG  TABS (SIMVASTATIN) take 1/2 tablet by mouth once daily METOPROLOL TARTRATE 50 MG TABS (METOPROLOL TARTRATE) Take one tablet twice daily ADULT ASPIRIN LOW STRENGTH 81 MG  TBDP (ASPIRIN) one by mouth daily SYNTHROID 50 MCG  TABS (LEVOTHYROXINE SODIUM) one by mouth once daily AMBIEN 10 MG  TABS (ZOLPIDEM TARTRATE) one by mouth at bedtime RAMIPRIL 5 MG CAPS (RAMIPRIL) 1 by mouth once daily HYDROCHLOROTHIAZIDE 25 MG  TABS (HYDROCHLOROTHIAZIDE) 1/2 by mouth once daily KLOR-CON 10 10 MEQ CR-TABS (POTASSIUM CHLORIDE) 1/2 tab once daily ALLEGRA 180 MG  TABS (FEXOFENADINE HCL) take one by mouth daily as needed NASONEX 50 MCG/ACT  SUSP (MOMETASONE FUROATE) 2 sprays in each nostril once daily as needed PROTONIX 40 MG TBEC (PANTOPRAZOLE SODIUM) Take 1 tablet by mouth as needed ASTELIN 137 MCG/SPRAY SOLN (AZELASTINE HCL) 2 sprays in each nostril twice daily Current Allergies: ! ZOCOR CODEINE TEGRETOL * TAPE PAXIL  ELAVIL LIPITOR * CRESTOR * ZYRTEC * CONTRAST DYE      Tetanus/Td Vaccine    Vaccine Type: Td    Site: left deltoid    Mfr: San[employer]i Pasteur    Dose: 0.5 ml    Route: IM    Given by: Lowella Petties CMA    Exp. Date: 08/25/2011    Lot #: [id number]    VIS given: 07/08/07 version given September 07, 2009.

## 2010-09-19 NOTE — Assessment & Plan Note (Signed)
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 She underwent further generator replacement in April.  She has done well since then except for mild discomfort around her device  The patient denies SOB, chest pain, edema or palpitations  Is a significant amount of recent stress related to the hospitalization of her mom; she is currently intubated in [place]    She had also undergone outpatient therapy for Dr. Shon Hough for solution of the keloid related to her ICD insertion and so we used Mepitel for about one month following her procedure this time There has been no intercurrent discharges;    she has been diagnosed with sleep apnea; she has been reluctant however to followup for therapy. He has significant sleep disorder breathing and daytime somnolence  Current Medications (verified): 1)  Lumigan 0.03 %  Soln (Bimatoprost) .... Use As Directed 2)  Zocor 40 Mg  Tabs (Simvastatin) .... Take 1/2 Tablet By Mouth Once Daily 3)  Metoprolol Tartrate 25 Mg Tabs (Metoprolol Tartrate) .... Take One Tablet Two Times A Day 4)  Adult Aspirin Low Strength 81 Mg  Tbdp (Aspirin) .... One By Mouth Daily 5)  Synthroid 50 Mcg  Tabs (Levothyroxine Sodium) .... One By Mouth Once Daily 6)  Ambien 10 Mg  Tabs (Zolpidem Tartrate) .... One By Mouth At Bedtime 7)  Ramipril 5 Mg Caps (Ramipril) .[name].. 1 By Mouth Once Daily 8)  Hydrochlorothiazide 25 Mg  Tabs (Hydrochlorothiazide) .... 1/2 By Mouth Once Daily 9)  Klor-Con 10 10 Meq Cr-Tabs (Potassium Chloride) .... 1/2 Tab Once Daily 10)  Allegra 180 Mg  Tabs (Fexofenadine Hcl) .... Take One By Mouth Daily As Needed 11)  Protonix 40 Mg Tbec (Pantoprazole Sodium) .... Take 1 Tablet By Mouth As Needed 12)  Astelin 137 Mcg/spray Soln  (Azelastine Hcl) .... 2 Sprays in Each Nostril Twice Daily 13)  Vitamin C 500 Mg  Tabs (Ascorbic Acid) .... Take 1 Tablet By Mouth Once A Day 14)  Advil 200 Mg Tabs (Ibuprofen) .... Otc As Directed. 15)  Metrogel-Vaginal 0.75 % Gel (Metronidazole) .[name].. 1 Applicator Intravaginally At Bedtime For 10 Days  Allergies (verified): 1)  ! 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) UNSPECIFIED OTALGIA (ICD-388.70) FATIGUE (ICD-780.79) UNSPECIFIED VITAMIN D DEFICIENCY (ICD-268.9) HEADACHE (ICD-784.0) Hosp for TACHYCARDIA (ICD-785.0) Hx of PULMONARY EMBOLISM (ICD-415.19) MITRAL VALVE PROLAPSE (ICD-424.0) FATTY LIVER DISEASE (ICD-571.8) Hx of FIBROMYALGIA (ICD-729.1) 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) HYPOTHYROIDISM (ICD-244.9) HYPERTENSION (ICD-401.9) HYPERLIPIDEMIA (ICD-272.4) GERD (ICD-530.81) DIVERTICULOSIS, COLON (ICD-562.10) DEPRESSION (ICD-311) ALLERGIC RHINITIS (ICD-477.9)   rheum- [facility] neurology- [facility]   Vital Signs:  Patient profile:   60 year old female Height:      66 inches Weight:      212 pounds BMI:     34.34 Pulse rate:   67 / minute Resp:     16 per minute BP sitting:   116 / 78  (left arm)  Vitals Entered By: Marrion Coy, CNA (April 04, 2010  2:29 PM)  Physical Exam  General:  overweight appearing female inNAD Head:   normal HEENT Neck:  supple without thyromegaly or neck veins Chest Wall:  device pocket well healed with a very small keloid of the iinferior aspect of the incision Lungs:  clear to auscultation Heart:  regular rate and rhythm Abdomen:  soft nontender Extremities:  no clubbing cyanosis or edema  Neurologic:  alert and  oreinted x3  grossly normla motor and sensory function  Skin:  as above Psych:  normla affect    ICD Specifications Following MD:  Sherryl Manges, MD     ICD Vendor:  [facility] [employer]     ICD Model Number:  E100     ICD Serial Number:  [id number] ICD DOI:  12/22/2009     ICD Implanting MD:  Sherryl Manges, MD  Lead 1:    Location: RV     DOI: 01/21/2004     Model #: 0626     Serial #: [id number]     Status: active  Indications::  SARCOID, NSVT   ICD Follow Up Battery Voltage:  OK V     Charge Time:  8.4 seconds     Battery Est. Longevity:  9 YRS Underlying rhythm:  SR ICD Dependent:  No       ICD Device Measurements Right Ventricle:  Amplitude: 14.1 mV, Impedance: 579 ohms, Threshold: 0.7 V at 0.4 msec Shock Impedance: 54 ohms   Episodes MS Episodes:  0     Shock:  0     ATP:  0     Nonsustained:  0     Atrial Therapies:  0 Ventricular Pacing:  6%  Brady Parameters Mode VVI     Lower Rate Limit:  40      Tachy Zones VF:  240     VT:  210     VT1:  170 (MONITOR)     Next Cardiology Appt Due:  06/20/2010 Tech Comments:  NORMAL DEVICE FUNCTION.  CHANGED RV OUTPUT FROM 2.0 TO 2.5 V.  ROV IN 3 MTHS W/DEVICE CLINIC. [name]  April 04, 2010 2:42 PM  Impression & Recommendations:  Problem # 1:  VENTRICULAR TACHYCARDIA (ICD-427.1)  There has been no intercurrent ventricular tachycardia Her updated medication list for this problem includes:    Metoprolol Tartrate 25 Mg Tabs (Metoprolol tartrate) .[name]... Take one tablet two times a day    Adult Aspirin Low Strength 81 Mg Tbdp (Aspirin) ..... One by mouth daily    Ramipril 5 Mg Caps (Ramipril) .[name]... 1 by mouth once daily  Her updated medication list for this problem includes:    Metoprolol Tartrate 25 Mg Tabs (Metoprolol tartrate) .[name]... Take one tablet two times a day    Adult Aspirin Low Strength 81 Mg Tbdp (Aspirin) ..... One by mouth daily    Ramipril 5 Mg Caps (Ramipril) .[name]... 1 by mouth once daily  Problem # 2:  ICD - IN SITU  (ICD-V45.02) Device parameters and data were reviewed and no changes were made  Problem # 3:  SLEEP APNEA, OBSTRUCTIVE (ICD-327.23) the patient is encouraged to get therapy for her sleep apnea. We have told her we would be glad to facilitate this.  Problem # 4:  HYPERTENSION (ICD-401.9)  Her updated medication list for this problem includes:    Metoprolol Tartrate 25 Mg Tabs (Metoprolol tartrate) .[name]... Take one tablet two times a day    Adult Aspirin Low Strength 81 Mg Tbdp (Aspirin) ..... One by  mouth daily    Ramipril 5 Mg Caps (Ramipril) .[name]... 1 by mouth once daily    Hydrochlorothiazide 25 Mg Tabs (Hydrochlorothiazide) .[name]... 1/2 by mouth once daily  Her updated medication list for this problem includes:    Metoprolol Tartrate 25 Mg Tabs (Metoprolol tartrate) .[name]... Take one tablet two times a day    Adult Aspirin Low Strength 81 Mg Tbdp (Aspirin) ..... One by mouth daily    Ramipril 5 Mg Caps (Ramipril) .[name]... 1 by mouth once daily    Hydrochlorothiazide 25 Mg Tabs (Hydrochlorothiazide) .[name]... 1/2 by mouth once daily  Patient Instructions: 1)  Your physician recommends that you schedule a follow-up appointment in: YEAR WITH DR Graciela Husbands 2)  Your physician recommends that you continue on your current medications as directed. Please refer to the Current Medication list given to you today. 3)  You have been referred to WILL CALL FOR REFERRAL TO [facility] FOR SLEEP APNEA

## 2010-09-19 NOTE — Progress Notes (Signed)
Summary: home monitor-latitude, defib was changed las week  Phone Note Call from Patient Call back at Home Phone [phone] Call back at [phone]   Caller: Patient Reason for Call: Talk to Nurse Summary of Call: have question regarding home monitor- latitude. defib was changed last week.  Initial call taken by: Lorne Skeens,  Dec 28, 2009 3:43 PM  Follow-up for Phone Call        Called patient and left message on machine Gypsy Balsam RN BSN  Dec 28, 2009 4:57 PM  discussed at office visit. Gypsy Balsam RN BSN  Jan 03, 2010 7:51 AM

## 2010-09-19 NOTE — Cardiovascular Report (Signed)
Summary: Office Visit   Office Visit   Imported By: Roderic Ovens 04/05/2010 09:23:29  _____________________________________________________________________  External Attachment:    Type:   Image     Comment:   External Document

## 2010-09-19 NOTE — Assessment & Plan Note (Signed)
Summary: f1y/jml   Referring Provider:  Charlton Haws Primary Provider:  Judith Part MD   History of Present Illness: [name] is seen today for F/U.  She has previously seen Dr. Antoine Poche and Graciela Husbands as well as Dr. Diona Browner.  I am not sure why she has been on my schedule the last two times.  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  I don't see that this was ever documented by MRI.  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 Her generator was just changed and she has some issues with keloid formation. She is seeing Dr Shon Hough for this.  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  Pain is not always exertional, occasionally radiates to the right side.  It lasts a minute or two then subsides.  No associated diaphoresis, dyspnea or syncope.  Compliant with meds.  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) 8)  Pulmonary Sarcoidosis  (ICD-135) 9)  Mediastinal Lymphadenopathy  (ICD-785.6) 10)  Patent Foramen Ovale  (ICD-745.5) 11)  Tia  (ICD-435.9) 12)  Takotsubo Syndrome  (ICD-429.83) 13)  Fatigue  (ICD-780.79) 14)  Unspecified Vitamin D Deficiency  (ICD-268.9) 15)  Hx of Pulmonary Embolism  (ICD-415.19) 16)  Mitral Valve Prolapse  (ICD-424.0) 17)  Fatty Liver Disease  (ICD-571.8) 18)  Hx of Fibromyalgia  (ICD-729.1) 19)  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 Use As Directed 2)  Zocor 40 Mg  Tabs (Simvastatin) .... Take 1/2 Tablet By Mouth Once Daily 3)  Metoprolol Tartrate 25 Mg Tabs (Metoprolol Tartrate) .... Take One Tablet Two Times A Day 4)  Adult Aspirin Low Strength 81 Mg  Tbdp (Aspirin) .... One By Mouth Daily 5)  Synthroid 50 Mcg  Tabs (Levothyroxine Sodium) .... One By Mouth Once Daily 6)  Ambien 10 Mg  Tabs (Zolpidem Tartrate) .... One By Mouth At Bedtime 7)  Ramipril 5 Mg Caps (Ramipril) .[name].. 1 By Mouth Once Daily 8)  Hydrochlorothiazide 25 Mg  Tabs (Hydrochlorothiazide) .... 1/2 By Mouth Once Daily 9)  Klor-Con 10 10 Meq Cr-Tabs (Potassium Chloride) .... 1/2 Tab Once Daily 10)  Allegra 180 Mg  Tabs (Fexofenadine Hcl) .... Take One By Mouth Daily As Needed 11)  Protonix 40 Mg Tbec (Pantoprazole Sodium) .... Take 1 Tablet By Mouth As Needed 12)  Astelin 137 Mcg/spray Soln (Azelastine Hcl) .... 2 Sprays in Each Nostril Twice Daily 13)  Vitamin C 500 Mg  Tabs (Ascorbic Acid) .... Take 1 Tablet By Mouth Once A Day 14)  Advil 200 Mg Tabs (Ibuprofen) .... Otc As Directed. 15)  Tylenol Extra Strength 500 Mg Tabs (Acetaminophen) .... Otc As Directed. 16)  Benadryl 25 Mg Caps (Diphenhydramine Hcl) .... Otc As Directed.  Allergies (verified): 1)  ! Zocor 2)  Codeine 3)  Tegretol 4)  * Tape  5)  Paxil 6)  Elavil 7)  Lipitor 8)  * Crestor 9)  * Zyrtec 10)  * Contrast Dye  Past History:  Past Medical History: Last updated: 10/18/2009 h/o mediastinal LN ICD - IN SITU (ICD-V45.02) VENTRICULAR TACHYCARDIA (ICD-427.1) PATENT FORAMEN OVALE (ICD-745.5) TIA (ICD-435.9) TAKOTSUBO SYNDROME (ICD-429.83) UNSPECIFIED OTALGIA (ICD-388.70) FATIGUE (ICD-780.79) UNSPECIFIED VITAMIN D DEFICIENCY (ICD-268.9) HEADACHE (ICD-784.0) Hosp for TACHYCARDIA (ICD-785.0) Hx of PULMONARY EMBOLISM (ICD-415.19) MITRAL VALVE PROLAPSE (ICD-424.0) FATTY LIVER DISEASE (ICD-571.8) Hx of FIBROMYALGIA  (ICD-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 EF 50% (07/20/2002) Carotid doppler- negative (10/2004) Admit- GI bleed, PUD/ diverticulosis ( EGD/ colonoscopy)  (03/2003) PE- hosp./ defib.(nonsustained v tach) ABI's- normal (06/2005) ICD - Guidant   Family History: Last updated: 09/07/2009 mother DM brother DM Family History of Cardiomyopathy:  brother - thyroid problem   allergies: sister heart disease: sister, brother, father cancer: maternal grandmother (colon) maternal grandfather (lung)   Social History: Last updated: 05/19/2009 Patient is a former smoker.  started at age 60.  less than 1 ppd.  quit 1993 rare alcoholic beverage Retired from [employer] pt has 2 Children Lives alone pt is divorced.   Review of Systems       Denies fever, malais, weight loss, blurry vision, decreased visual acuity, cough, sputum, SOB, hemoptysis, pleuritic pain, palpitaitons, heartburn, abdominal pain, melena, lower extremity edema, claudication, or rash.   Vital Signs:  Patient profile:   60 year old female Height:      66 inches Weight:      211 pounds BMI:     34.18 Pulse rate:   80 / minute Resp:     16 per minute BP sitting:   116 / 80  (right arm)  Vitals Entered By: Towanda Octave CMA (May 01, 2010 9:30 AM)  Physical Exam  General:  Affect appropriate Healthy:  appears stated age HEENT: normal Neck supple with no adenopathy JVP normal no bruits no thyromegaly Lungs clear with no wheezing and good diaphragmatic motion Heart:  S1/S2 no murmur,rub, gallop or  click PMI normal Abdomen: benighn, BS positve, no tenderness, no AAA no bruit.  No HSM or HJR Distal pulses intact with no bruits No edema Neuro non-focal Skin warm and dry Small keloid over AICD sight    ICD Specifications Following MD:  Sherryl Manges, MD     ICD Vendor:  [facility] [employer]     ICD Model Number:  E100     ICD Serial Number:  [id number] ICD DOI:  12/22/2009     ICD Implanting MD:  Sherryl Manges, MD  Lead 1:    Location: RV     DOI: 01/21/2004     Model #: [id number]     Serial #: [id number]     Status: active  Indications::  SARCOID, NSVT   ICD Follow Up ICD Dependent:  No      Brady Parameters Mode VVI     Lower Rate Limit:  40      Tachy Zones VF:  240     VT:  210     VT1:  170 (MONITOR)     Impression & Recommendations:  Problem # 1:  CHEST PAIN UNSPECIFIED (ICD-786.50) No CAD prior to original AICD 7 years ago.  F/U stress myovue.  ECG with no BBB so should be able to exercise Her updated medication list for this problem includes:    Metoprolol Tartrate 25 Mg Tabs (Metoprolol tartrate) .[name]... Take one tablet two times a day    Adult Aspirin Low Strength 81 Mg Tbdp (Aspirin) ..... One by mouth daily    Ramipril 5 Mg Caps (Ramipril) .[name]... 1 by mouth once daily  Orders: Nuclear Stress Test (Nuc Stress Test)  Problem # 2:  VENTRICULAR TACHYCARDIA (ICD-427.1) Stable with AICD in place.  F/U Dr Graciela Husbands.   Her updated medication list for this problem includes:    Metoprolol Tartrate 25 Mg Tabs (Metoprolol tartrate) .[name]... Take one tablet two times a day    Adult Aspirin Low Strength 81 Mg Tbdp (Aspirin) ..... One by mouth daily    Ramipril 5 Mg Caps (Ramipril) .[name]... 1 by mouth once daily  Problem # 3:  PULMONARY SARCOIDOSIS (ICD-135) F/U with Dr Shelle Iron.  yearly CXR.  Status appears stable  Patient Instructions: 1)  Your physician recommends that you schedule a follow-up appointment in: 3 MONTHS WITH DR Graciela Husbands 2)  Your physician recommends that you continue on your current  medications as directed. Please refer to the Current Medication list given to you today. 3)  Your physician has requested that you have an exercise stress myoview.  For further information please visit [email or web address]  Please follow instruction sheet, as given.

## 2010-09-21 ENCOUNTER — Ambulatory Visit: Admit: 2010-09-21 | Payer: Self-pay

## 2010-09-21 ENCOUNTER — Encounter (INDEPENDENT_AMBULATORY_CARE_PROVIDER_SITE_OTHER): Payer: Medicare Other

## 2010-09-21 ENCOUNTER — Encounter: Payer: Self-pay | Admitting: Internal Medicine

## 2010-09-21 DIAGNOSIS — I472 Ventricular tachycardia: Secondary | ICD-10-CM

## 2010-09-21 NOTE — Assessment & Plan Note (Signed)
Summary: CPX GYN EXAM/RBH   Vital Signs:  Patient profile:   60 year old female Height:      66 inches Weight:      210.75 pounds BMI:     34.14 Temp:     97.9 degrees F oral Pulse rate:   72 / minute Pulse rhythm:   regular BP sitting:   96 / 58  (left arm) Cuff size:   large  Vitals Entered By: Lewanda Rife LPN (September 15, 2010 2:26 PM) CC: CPX and nonproductive cough with yellow mucus from nose   History of Present Illness: here for check up of chronic health problems  lots of stress as usual  also for uri symptoms and non prod cough  has had it since monday feels like a cold - started in her throat -- and when she coughs -- can feel a little phlegm- does not come up blows nose is yellow d/c a little sinus pain  no fever here -- may have a little at night  coughs all night long   wt is down 2 lb  HTN in good control 96/58  lipids well controlled witih trig 128 and HDL 42 and LDL 89  hx of fatty liver- lfts are nl   hypothyroid tsh is stable and theraputic  partial hyst in past  used a lubricant - that irritated her - and some spots still bother her - thinks that is ok  does not want me to check it at this time  pap nl 03   mam 1/12 recently nl  self exam- no lumps  td 1/11 flu shot in fall  ptx in 09  zoster status -- has not called insurance yet   nl cbc  wbc is at baseline   she is getting shots/ lubricant type in knees for oa still struggles with that - waiting to think about surgery   still has fibromyalgia   colonosc 2004   Allergies: 1)  Codeine 2)  Tegretol 3)  * Tape 4)  Paxil 5)  Elavil 6)  Lipitor 7)  * Crestor 8)  * Zyrtec 9)  * Contrast Dye  Past History:  Past Medical History: Last updated: 10/18/2009 h/o mediastinal LN ICD - IN SITU (ICD-V45.02) VENTRICULAR TACHYCARDIA (ICD-427.1) PATENT FORAMEN OVALE (ICD-745.5) TIA (ICD-435.9) TAKOTSUBO SYNDROME (ICD-429.83) UNSPECIFIED OTALGIA (ICD-388.70) FATIGUE  (ICD-780.79) UNSPECIFIED VITAMIN D DEFICIENCY (ICD-268.9) HEADACHE (ICD-784.0) Hosp for TACHYCARDIA (ICD-785.0) Hx of PULMONARY EMBOLISM (ICD-415.19) MITRAL VALVE PROLAPSE (ICD-424.0) FATTY LIVER DISEASE (ICD-571.8) 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 EF 50% (07/20/2002) Carotid doppler- negative (10/2004) Admit- GI bleed, PUD/ diverticulosis ( EGD/ colonoscopy)  (03/2003) PE- hosp./ defib.(nonsustained v tach) ABI's- normal (06/2005) ICD - Guidant   Family History: Last updated: 09/07/2009 mother DM brother DM Family History of Cardiomyopathy:  brother - thyroid problem   allergies: sister heart disease: sister, brother, father cancer: maternal grandmother (colon) maternal grandfather (lung)   Social History: Last updated: 05/19/2009 Patient is a former smoker.  started at age 51.  less than 1 ppd.  quit 1993 rare alcoholic beverage Retired from [employer] pt has 2 Children Lives alone pt is divorced.   Risk Factors: Smoking Status: quit (11/18/2006)  Review of Systems General:  Complains of fatigue; denies chills and fever. Eyes:  Denies blurring and eye irritation. ENT:  Complains of nasal congestion and postnasal drainage; denies sore throat. CV:  Denies chest pain or discomfort, palpitations, and shortness of breath with exertion. Resp:  Complains of cough; denies shortness of breath and wheezing. GI:  Denies abdominal pain, diarrhea, nausea, and vomiting. GU:  Denies discharge, dysuria, and urinary frequency. MS:  Complains of joint  pain and stiffness; denies joint redness and joint swelling. Derm:  Denies itching, lesion(s), poor wound healing, and rash. Neuro:  Denies headaches, numbness, and tingling. Psych:  mood is ok . Endo:  Denies cold intolerance, excessive thirst, excessive urination, and heat intolerance. Heme:  Denies abnormal bruising and bleeding.  Physical Exam  General:  overweight appearing female inNAD Head:  normocephalic, atraumatic, and no abnormalities observed.  no sinus tenderness  Eyes:  vision grossly intact, pupils equal, pupils round, and pupils reactive to light.  no conjunctival pallor, injection or icterus  Ears:  R ear normal and L ear normal.   Nose:  nares are injected and congested bilaterally  Mouth:  pharynx pink and moist, no erythema, and no exudates. some clear post nasal drip  Neck:  supple with full rom and no masses or thyromegally, no JVD or carotid bruit  Chest Wall:  No deformities, masses, or tenderness noted. Breasts:  No mass, nodules, thickening, tenderness, bulging, retraction, inflamation, nipple discharge or skin changes noted.   Lungs:  good air exch- no wheeze  no crackles, no ronchi  Heart:  Normal rate and regular rhythm. S1 and S2 normal without gallop, murmur, click, rub or other extra sounds. Abdomen:  Bowel sounds positive,abdomen soft and non-tender without masses, organomegaly or hernias noted. no renal bruits  Msk:  No deformity or scoliosis noted of thoracic or lumbar spine.  poor rom of knees bilat  Pulses:  R and L carotid,radial,femoral,dorsalis pedis and posterior tibial pulses are full and equal bilaterally Extremities:  No clubbing, cyanosis, edema, or deformity noted with normal full range of motion of all joints.   Neurologic:  sensation intact to light touch, gait normal, and DTRs symmetrical and normal.   Skin:  Intact without suspicious lesions or rashes Cervical Nodes:  No lymphadenopathy noted Axillary Nodes:  No palpable  lymphadenopathy Inguinal Nodes:  No significant adenopathy Psych:  normal affect, talkative and pleasant    Impression & Recommendations:  Problem # 1:  FATTY LIVER DISEASE (ICD-571.8) Assessment Improved  better lfts counseled on wt loss and low fat diet   Orders: Prescription Created Electronically [phone])  Problem # 2:  HYPOTHYROIDISM (ICD-244.9) Assessment: Unchanged  stable clinically and with theraputic tsh  no problems  no change in dose  Her updated medication list for this problem includes:    Synthroid 50 Mcg Tabs (Levothyroxine sodium) ..... One by mouth once daily  Labs Reviewed: TSH: 2.85 (09/11/2010)    HgBA1c: 5.5 (09/03/2006) Chol: 157 (09/11/2010)   HDL: 42.90 (09/11/2010)   LDL: 89 (09/11/2010)   TG: 128.0 (09/11/2010)  Orders: Prescription Created Electronically [phone])  Problem # 3:  HYPERLIPIDEMIA (ICD-272.4) Assessment: Unchanged  this is very well controlled - with zocor and diet rev low sat fat diet  rev labs with pt  Her updated medication list for this problem includes:    Zocor 40 Mg Tabs (Simvastatin) .[name]... Take 1/2 tablet by mouth once daily  Labs Reviewed: SGOT: 31 (08/24/2009)   SGPT: 30 (08/24/2009)   HDL:42.90 (09/11/2010), 51.00 (08/24/2009)  LDL:89 (  09/11/2010), 91 (08/24/2009)  Chol:157 (09/11/2010), 164 (08/24/2009)  Trig:128.0 (09/11/2010), 110.0 (08/24/2009)  Orders: Prescription Created Electronically [phone])  Problem # 4:  HYPERTENSION (ICD-401.9) Assessment: Unchanged  excellent with current meds  no hypotensive symptoms  doing well overall Her updated medication list for this problem includes:    Metoprolol Tartrate 25 Mg Tabs (Metoprolol tartrate) .[name]... Take one tablet two times a day    Ramipril 5 Mg Caps (Ramipril) .[name]... 1 by mouth once daily    Hydrochlorothiazide 25 Mg Tabs (Hydrochlorothiazide) .[name]... 1/2 by mouth once daily  BP today: 96/58 Prior BP: 120/62 (08/04/2010)  Labs Reviewed: K+: 4.0  (09/11/2010) Creat: : 0.8 (09/11/2010)   Chol: 157 (09/11/2010)   HDL: 42.90 (09/11/2010)   LDL: 89 (09/11/2010)   TG: 128.0 (09/11/2010)  Orders: Prescription Created Electronically [id number])  Problem # 5:  URI (ICD-465.9) Assessment: New  viral with nasal symptoms and dry cough  pt advised to update me if symptoms worsen or do not improve - esp sinus pain  The following medications were removed from the medication list:    Tylenol Extra Strength 500 Mg Tabs (Acetaminophen) ..... Otc as directed. Her updated medication list for this problem includes:    Adult Aspirin Low Strength 81 Mg Tbdp (Aspirin) ..... One by mouth daily    Allegra 180 Mg Tabs (Fexofenadine hcl) .[name]... Take one by mouth daily as needed    Advil 200 Mg Tabs (Ibuprofen) ..... Otc as directed.    Benadryl 25 Mg Caps (Diphenhydramine hcl) ..... Otc as directed.    Tessalon Perles 100 Mg Caps (Benzonatate) .[name]... 1 cap by mouth three times a day as needed cough    Tylenol Arthritis Pain 650 Mg Cr-tabs (Acetaminophen) ..... Otc as directed.    Aleve 220 Mg Tabs (Naproxen sodium) ..... Otc as directed.    Tussionex Pennkinetic Er 10-8 Mg/68ml Lqcr (Hydrocod polst-chlorphen polst) .[name]... 1/2 to 1 teaspoon by mouth two times a day as needed cough watch for sedation  Orders: Prescription Created Electronically [id number])  Problem # 6:  HEALTH MAINTENANCE EXAM (ICD-V70.0) Assessment: Comment Only  reviewed health habits including diet, exercise and skin cancer prevention reviewed health maintenance list and family history  labs reviewed in detail   Orders: Prescription Created Electronically [id number])  Complete Medication List: 1)  Lumigan 0.03 % Soln (Bimatoprost) .... Use as directed 2)  Zocor 40 Mg Tabs (Simvastatin) .... Take 1/2 tablet by mouth once daily 3)  Metoprolol Tartrate 25 Mg Tabs (Metoprolol tartrate) .... Take one tablet two times a day 4)  Adult Aspirin Low Strength 81 Mg Tbdp (Aspirin) .... One by mouth  daily 5)  Synthroid 50 Mcg Tabs (Levothyroxine sodium) .... One by mouth once daily 6)  Ambien 10 Mg Tabs (Zolpidem tartrate) .... One by mouth at bedtime 7)  Ramipril 5 Mg Caps (Ramipril) .[name].. 1 by mouth once daily 8)  Hydrochlorothiazide 25 Mg Tabs (Hydrochlorothiazide) .... 1/2 by mouth once daily 9)  Klor-con 10 10 Meq Cr-tabs (Potassium chloride) .... 1/2 tab once daily 10)  Allegra 180 Mg Tabs (Fexofenadine hcl) .... Take one by mouth daily as needed 11)  Protonix 40 Mg Tbec (Pantoprazole sodium) .... Take 1 tablet by mouth as needed 12)  Astelin 137 Mcg/spray Soln (Azelastine hcl) .... 2 sprays in each nostril twice daily 13)  Vitamin C 500 Mg Tabs (Ascorbic acid) .... Take 1 tablet by mouth once a day 14)  Advil 200 Mg Tabs (Ibuprofen) .... Otc as directed. 15)  Benadryl  25 Mg Caps (Diphenhydramine hcl) .... Otc as directed. 16)  Tessalon Perles 100 Mg Caps (Benzonatate) .[name].. 1 cap by mouth three times a day as needed cough 17)  Tylenol Arthritis Pain 650 Mg Cr-tabs (Acetaminophen) .... Otc as directed. 18)  Tramadol ?mg  .... One three times a day as needed 19)  Aleve 220 Mg Tabs (Naproxen sodium) .... Otc as directed. 20)  Vitamin D 500 Iu  .... Take 1 tablet by mouth once a day 21)  Tussionex Pennkinetic Er 10-8 Mg/66ml Lqcr (Hydrocod polst-chlorphen polst) .... 1/2 to 1 teaspoon by mouth two times a day as needed cough watch for sedation  Patient Instructions: 1)  call your insurace to see about coverage for shingles vaccine - let me know  2)  you can try mucinex over the counter twice daily as directed and nasal saline spray for congestion 3)  tylenol over the counter as directed may help with aches, headache and fever 4)  call if symptoms worsen or if not improved in 4-5 days  5)  take tussionex for cough as needed - caution of sedation  6)  no other medicine changes  Prescriptions: PROTONIX 40 MG TBEC (PANTOPRAZOLE SODIUM) Take 1 tablet by mouth as needed  #90 x 3   Entered  and Authorized by:   Judith Part MD   Signed by:   Judith Part MD on 09/15/2010   Method used:   Print then Give to Patient   RxID:   [id number] KLOR-CON 10 10 MEQ CR-TABS (POTASSIUM CHLORIDE) 1/2 tab once daily  #45 x 3   Entered and Authorized by:   Judith Part MD   Signed by:   Judith Part MD on 09/15/2010   Method used:   Print then Give to Patient   RxID:   [id number] HYDROCHLOROTHIAZIDE 25 MG  TABS (HYDROCHLOROTHIAZIDE) 1/2 by mouth once daily  #45 x 3   Entered and Authorized by:   Judith Part MD   Signed by:   Judith Part MD on 09/15/2010   Method used:   Print then Give to Patient   RxID:   [id number] RAMIPRIL 5 MG CAPS (RAMIPRIL) 1 by mouth once daily  #90 x 3   Entered and Authorized by:   Judith Part MD   Signed by:   Judith Part MD on 09/15/2010   Method used:   Print then Give to Patient   RxID:   [id number] AMBIEN 10 MG  TABS (ZOLPIDEM TARTRATE) one by mouth at bedtime  #90 x 3   Entered and Authorized by:   Judith Part MD   Signed by:   Judith Part MD on 09/15/2010   Method used:   Print then Give to Patient   RxID:   [id number] SYNTHROID 50 MCG  TABS (LEVOTHYROXINE SODIUM) one by mouth once daily  #90 x 3   Entered and Authorized by:   Judith Part MD   Signed by:   Judith Part MD on 09/15/2010   Method used:   Print then Give to Patient   RxID:   [id number] ZOCOR 40 MG  TABS (SIMVASTATIN) take 1/2 tablet by mouth once daily  #45 x 3   Entered and Authorized by:   Judith Part MD   Signed by:   Judith Part MD on 09/15/2010   Method used:   Print then Give to Patient   RxID:   [id number] [facility]  ER 10-8 MG/5ML LQCR (HYDROCOD POLST-CHLORPHEN POLST) 1/2 to 1 teaspoon by mouth two times a day as needed cough watch for sedation  #8 oz x 0   Entered and Authorized by:   Judith Part MD   Signed by:   Judith Part MD on 09/15/2010   Method used:   Print  then Give to Patient   RxID:   [id number]    Orders Added: 1)  Est. Patient 40-64 years [99396] 2)  Prescription Created Electronically [[id number]] 3)  Est. Patient Level II [56213]    Current Allergies (reviewed today): CODEINE TEGRETOL * TAPE PAXIL ELAVIL LIPITOR * CRESTOR * ZYRTEC * CONTRAST DYE

## 2010-09-21 NOTE — Progress Notes (Signed)
----   Converted from flag ---- ---- 09/10/2010 9:44 AM, Colon Flattery Tower MD wrote: please check wellness/ lipid and D v70.0 and 272 and 401.1 and vit D def and 244.9 thanks  ---- 09/08/2010 11:38 AM, Liane Comber CMA (AAMA) wrote: Lab orders please! Good Morning! This pt is scheduled for cpx labs Monday, which labs to draw and dx codes to use? Thanks Tasha ------------------------------

## 2010-09-21 NOTE — Progress Notes (Signed)
Summary: exposed to shingles   Phone Note Call from Patient Call back at Home Phone [phone]   Caller: Patient Call For: Judith Part MD Summary of Call: Patient went to nursing home to visit a church member and was exposed to shingles. She is asking if she should get the shingels vaccine. Please advise.  Initial call taken by: Melody Comas,  August 25, 2010 4:53 PM  Follow-up for Phone Call        It is a good idea for her to get a shingles vaccine anyway she has to check with her insurance first about coverage (is very expensive) then call us to schedule or get px for pharmacy  double check that she is not on any immune modifying type medication -- but I do not think she is  Follow-up by: Judith Part MD,  August 25, 2010 5:11 PM  Additional Follow-up for Phone Call Additional follow up Details #1::        Advised pt.  She will check with her insurance and will call back.  No other meds besides what's on med list. Additional Follow-up by: Lowella Petties CMA, AAMA,  August 25, 2010 5:20 PM

## 2010-09-21 NOTE — Letter (Signed)
Summary: [facility]  [facility]   Imported By: Lanelle Bal 09/07/2010 09:04:36  _____________________________________________________________________  External Attachment:    Type:   Image     Comment:   External Document

## 2010-09-21 NOTE — Letter (Signed)
Summary: Results Follow up Letter  [place] [facility]  [address] [place], [place] [address]   Phone: [phone]  Fax: [phone]    08/28/2010 MRN: [id number]    [facility] [address] [place], [place]  [address]    Dear [name],  The following are the results of your recent test(s):  Test         Result    Pap Smear:        Normal _____  Not Normal _____ Comments: ______________________________________________________ Cholesterol: LDL(Bad cholesterol):         Your goal is less than:         HDL (Good cholesterol):       Your goal is more than: Comments:  ______________________________________________________ Mammogram:        Normal __X___  Not Normal _____ Comments:Repeat in one year.   ___________________________________________________________________ Hemoccult:        Normal _____  Not normal _______ Comments:    _____________________________________________________________________ Other Tests:    We routinely do not discuss normal results over the telephone.  If you desire a copy of the results, or you have any questions about this information we can discuss them at your next office visit.   Sincerely,    Idamae Schuller Porcha Deblanc,MD  MT/ri

## 2010-09-21 NOTE — Assessment & Plan Note (Signed)
Summary: pneumonia/alc   Vital Signs:  Patient profile:   60 year old female Height:      66 inches Weight:      212.50 pounds BMI:     34.42 Temp:     97.6 degrees F oral Pulse rate:   84 / minute Pulse rhythm:   regular BP sitting:   120 / 62  (right arm) Cuff size:   large  Vitals Entered By: Linde Gillis CMA Duncan Dull) (August 04, 2010 11:55 AM) CC: ? pneumonia   History of Present Illness: 60 yo here for ? pneumonia. Has a h/o sarcodosis and has been visiting her mom in nursing home that has very resistant PNA (had to call ID MD to nursing home).  Pt has had URI symptoms for two weeks- sore throat, dry cough, subjective fevers and malaise. Right side of back was hurting last night.    No increase WOB.    Current Medications (verified): 1)  Lumigan 0.03 %  Soln (Bimatoprost) .... Use As Directed 2)  Zocor 40 Mg  Tabs (Simvastatin) .... Take 1/2 Tablet By Mouth Once Daily 3)  Metoprolol Tartrate 25 Mg Tabs (Metoprolol Tartrate) .... Take One Tablet Two Times A Day 4)  Adult Aspirin Low Strength 81 Mg  Tbdp (Aspirin) .... One By Mouth Daily 5)  Synthroid 50 Mcg  Tabs (Levothyroxine Sodium) .... One By Mouth Once Daily 6)  Ambien 10 Mg  Tabs (Zolpidem Tartrate) .... One By Mouth At Bedtime 7)  Ramipril 5 Mg Caps (Ramipril) .[name].. 1 By Mouth Once Daily 8)  Hydrochlorothiazide 25 Mg  Tabs (Hydrochlorothiazide) .... 1/2 By Mouth Once Daily 9)  Klor-Con 10 10 Meq Cr-Tabs (Potassium Chloride) .... 1/2 Tab Once Daily 10)  Allegra 180 Mg  Tabs (Fexofenadine Hcl) .... Take One By Mouth Daily As Needed 11)  Protonix 40 Mg Tbec (Pantoprazole Sodium) .... Take 1 Tablet By Mouth As Needed 12)  Astelin 137 Mcg/spray Soln (Azelastine Hcl) .... 2 Sprays in Each Nostril Twice Daily 13)  Vitamin C 500 Mg  Tabs (Ascorbic Acid) .... Take 1 Tablet By Mouth Once A Day 14)  Advil 200 Mg Tabs (Ibuprofen) .... Otc As Directed. 15)  Tylenol Extra Strength 500 Mg Tabs (Acetaminophen) .... Otc As  Directed. 16)  Benadryl 25 Mg Caps (Diphenhydramine Hcl) .... Otc As Directed. 17)  Avelox 400 Mg  Tabs (Moxifloxacin Hcl) .[name].. 1 Tablet By Mouth Daily X 5 Days 18)  Diflucan 150 Mg Tabs (Fluconazole) .... Once Daily 19)  Tessalon Perles 100 Mg  Caps (Benzonatate) .[name].. 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) UNSPECIFIED OTALGIA (ICD-388.70) FATIGUE (ICD-780.79) UNSPECIFIED VITAMIN D DEFICIENCY (ICD-268.9) HEADACHE (ICD-784.0) Hosp for TACHYCARDIA (ICD-785.0) Hx of PULMONARY EMBOLISM (ICD-415.19) MITRAL VALVE PROLAPSE (ICD-424.0) FATTY LIVER DISEASE (ICD-571.8) 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 EF 50% (07/20/2002) Carotid  doppler- negative (10/2004) Admit- GI bleed, PUD/ diverticulosis ( EGD/ colonoscopy)  (03/2003) PE- hosp./ defib.(nonsustained v tach) ABI's- normal (06/2005) ICD - Guidant   Family History: Last updated: 09/07/2009 mother DM brother DM Family History of Cardiomyopathy:  brother - thyroid problem   allergies: sister heart disease: sister, brother, father cancer: maternal grandmother (colon) maternal grandfather (lung)   Social History: Last updated:  05/19/2009 Patient is a former smoker.  started at age 70.  less than 1 ppd.  quit 1993 rare alcoholic beverage Retired from [employer] pt has 2 Children Lives alone pt is divorced.   Risk Factors: Smoking Status: quit (11/18/2006)  Review of Systems      See HPI General:  Complains of fever and malaise. Resp:  Complains of cough; denies shortness of breath, sputum productive, and wheezing.  Physical Exam  General:  overweight appearing female inNAD Ears:  R ear normal and L ear normal.   Nose:  nares are boggy but clear  Mouth:  pharynx pink and moist, no erythema, and no exudates.   Lungs:  good air exch- no wheeze  no crackles, no ronchi  Heart:  Normal rate and regular rhythm. S1 and S2 normal without gallop, murmur, click, rub or other extra sounds. Abdomen:  Bowel sounds positive,abdomen soft and non-tender without masses, organomegaly or hernias noted. Extremities:  No clubbing, cyanosis, edema, or deformity noted with normal full range of motion of all joints.   Psych:  normal affect, talkative and pleasant    Impression & Recommendations:  Problem # 1:  COUGH (ICD-786.2) Assessment New  Given significant respiratory history, will treat with 5 day course of Avelox, Tessalon perles as needed cough. Pt advised to follow up next week if no improvement of symptoms.  Orders: Prescription Created Electronically [phone])  Complete Medication List: 1)  Lumigan 0.03 % Soln (Bimatoprost) .... Use as directed 2)  Zocor 40 Mg Tabs (Simvastatin) .... Take 1/2 tablet by mouth once daily 3)  Metoprolol Tartrate 25 Mg Tabs (Metoprolol tartrate) .... Take one tablet two times a day 4)  Adult Aspirin Low Strength 81 Mg Tbdp (Aspirin) .... One by mouth daily 5)  Synthroid 50 Mcg Tabs (Levothyroxine sodium) .... One by mouth once daily 6)  Ambien 10 Mg Tabs (Zolpidem tartrate) .... One by mouth at bedtime 7)  Ramipril 5 Mg Caps (Ramipril) .[name].. 1 by mouth once daily 8)   Hydrochlorothiazide 25 Mg Tabs (Hydrochlorothiazide) .... 1/2 by mouth once daily 9)  Klor-con 10 10 Meq Cr-tabs (Potassium chloride) .... 1/2 tab once daily 10)  Allegra 180 Mg Tabs (Fexofenadine hcl) .... Take one by mouth daily as needed 11)  Protonix 40 Mg Tbec (Pantoprazole sodium) .... Take 1 tablet by mouth as needed 12)  Astelin 137 Mcg/spray Soln (Azelastine hcl) .... 2 sprays in each nostril twice daily 13)  Vitamin C 500 Mg Tabs (Ascorbic acid) .... Take 1 tablet by mouth once a day 14)  Advil 200 Mg Tabs (Ibuprofen) .... Otc as directed. 15)  Tylenol Extra Strength 500 Mg Tabs (Acetaminophen) .... Otc as directed. 16)  Benadryl 25 Mg Caps (Diphenhydramine hcl) .... Otc as directed. 17)  Avelox 400 Mg Tabs (Moxifloxacin hcl) .[name].. 1 tablet by mouth daily x 5 days 18)  Diflucan 150 Mg Tabs (Fluconazole) .... Once daily 19)  Tessalon Perles 100 Mg Caps (Benzonatate) .[name].. 1 cap by mouth three times a day as needed cough Prescriptions: TESSALON PERLES 100 MG  CAPS (BENZONATATE) 1 cap by mouth  three times a day as needed cough  #30 x 0   Entered and Authorized by:   Ruthe Mannan MD   Signed by:   Ruthe Mannan MD on 08/04/2010   Method used:   [facility]        [facility] [facility]       [address]       [place], [place]  [address]       Ph: [phone] or [phone]       Fax: [phone]   RxID:   [id number] DIFLUCAN 150 MG TABS (FLUCONAZOLE) once daily  #1 x 0   Entered and Authorized by:   Ruthe Mannan MD   Signed by:   Ruthe Mannan MD on 08/04/2010   Method used:   [facility]        [facility]ail[facility]       [address]       [place], [place]  [address]       Ph: [phone] or [phone]       Fax: [phone]   RxID:   [id number] AVELOX 400 MG  TABS (MOXIFLOXACIN HCL) 1 tablet by mouth daily x 5 days  #5 x 0   Entered and Authorized by:   Ruthe Mannan MD   Signed by:   Ruthe Mannan MD on 08/04/2010   Method used:    [facility]        [facility] [id number]       [address]       [place], [place]  [address]       Ph: [phone] or [phone]       Fax: [phone]   RxID:   [id number]    Orders Added: 1)  Prescription Created Electronically [G8553] 2)  Est. Patient Level IV [85[address]]    Current Allergies (reviewed today): CODEINE TEGRETOL * TAPE PAXIL ELAVIL LIPITOR * CRESTOR * ZYRTEC * CONTRAST DYE

## 2010-09-22 NOTE — Cardiovascular Report (Signed)
Summary: Office Visit   Office Visit   Imported By: Roderic Ovens 06/29/2010 14:22:05  _____________________________________________________________________  External Attachment:    Type:   Image     Comment:   External Document

## 2010-10-05 NOTE — Procedures (Signed)
Summary: Cardiology Device [facility]   Current Medications (verified): 1)  Lumigan 0.03 %  Soln (Bimatoprost) .... Use As Directed 2)  Zocor 40 Mg  Tabs (Simvastatin) .... Take 1/2 Tablet By Mouth Once Daily 3)  Metoprolol Tartrate 25 Mg Tabs (Metoprolol Tartrate) .... Take One Tablet Two Times A Day 4)  Adult Aspirin Low Strength 81 Mg  Tbdp (Aspirin) .... One By Mouth Daily 5)  Synthroid 50 Mcg  Tabs (Levothyroxine Sodium) .... One By Mouth Once Daily 6)  Ambien 10 Mg  Tabs (Zolpidem Tartrate) .... One By Mouth At Bedtime 7)  Ramipril 5 Mg Caps (Ramipril) .[name].. 1 By Mouth Once Daily 8)  Hydrochlorothiazide 25 Mg  Tabs (Hydrochlorothiazide) .... 1/2 By Mouth Once Daily 9)  Klor-Con 10 10 Meq Cr-Tabs (Potassium Chloride) .... 1/2 Tab Once Daily 10)  Allegra 180 Mg  Tabs (Fexofenadine Hcl) .... Take One By Mouth Daily As Needed 11)  Protonix 40 Mg Tbec (Pantoprazole Sodium) .... Take 1 Tablet By Mouth As Needed 12)  Astelin 137 Mcg/spray Soln (Azelastine Hcl) .... 2 Sprays in Each Nostril Twice Daily 13)  Vitamin C 500 Mg  Tabs (Ascorbic Acid) .... Take 1 Tablet By Mouth Once A Day 14)  Advil 200 Mg Tabs (Ibuprofen) .... Otc As Directed. 15)  Benadryl 25 Mg Caps (Diphenhydramine Hcl) .... Otc As Directed. 16)  Tessalon Perles 100 Mg  Caps (Benzonatate) .[name].. 1 Cap By Mouth Three Times A Day As Needed Cough 17)  Tylenol Arthritis Pain 650 Mg Cr-Tabs (Acetaminophen) .... Otc As Directed. 18)  Tramadol ?mg .... One Three Times A Day As Needed 19)  Aleve 220 Mg Tabs (Naproxen Sodium) .... Otc As Directed. 20)  Vitamin D 500 Iu .... Take 1 Tablet By Mouth Once A Day 21)  Tussionex Pennkinetic Er 10-8 Mg/73ml Lqcr (Hydrocod Polst-Chlorphen Polst) .... 1/2 To 1 Teaspoon By Mouth Two Times A Day As Needed Cough Watch For Sedation  Allergies (verified): 1)  Codeine 2)  Tegretol 3)  * Tape 4)  Paxil 5)  Elavil 6)  Lipitor 7)  * Crestor 8)  * Zyrtec 9)  * Contrast Dye   ICD  Specifications Following MD:  Sherryl Manges, MD     ICD Vendor:  [facility] [employer]     ICD Model Number:  E100     ICD Serial Number:  [id number] ICD DOI:  12/22/2009     ICD Implanting MD:  Sherryl Manges, MD  Lead 1:    Location: RV     DOI: 01/21/2004     Model #: 4540     Serial #: [id number]     Status: active  Indications::  SARCOID, NSVT   ICD Follow Up Battery Voltage:  GOOD V     Charge Time:  8.6 seconds     Battery Est. Longevity:  12 YRS Underlying rhythm:  SR ICD Dependent:  No       ICD Device Measurements Right Ventricle:  Amplitude: 12.9 mV, Impedance: 610 ohms, Threshold: 0.7 V at 0.4 msec Shock Impedance: 54 ohms   Episodes MS Episodes:  0     Percent Mode Switch:  0     Shock:  0     ATP:  0     Nonsustained:  1     Atrial Therapies:  0 Ventricular Pacing:  <1%  Brady Parameters Mode VVI     Lower Rate Limit:  40      Tachy Zones VF:  240  VT:  210     VT1:  170 (MONITOR)     Next Cardiology Appt Due:  12/19/2010 Tech Comments:  NORMAL DEVICE FUNCTION.  1 NST EPISODE LASTING 7 BEATS.  NO CHANGES MADE. ROV IN 3 MTHS W/DEVICE CLINIC. [name]  September 21, 2010 3:42 PM

## 2010-10-05 NOTE — Cardiovascular Report (Signed)
Summary: Office Visit   Office Visit   Imported By: Roderic Ovens 09/26/2010 16:00:31  _____________________________________________________________________  External Attachment:    Type:   Image     Comment:   External Document

## 2010-10-11 ENCOUNTER — Telehealth: Payer: Self-pay | Admitting: Internal Medicine

## 2010-10-17 NOTE — Progress Notes (Signed)
Summary: pt needs refill  Phone Note Refill Request Call back at Home Phone [phone] Message from:  Patient  Refills Requested: Medication #1:  METOPROLOL TARTRATE 25 MG TABS take one tablet two times a day [facility] [address] [facility] [place] pt wants a 30day supply called in and a 90day Rx written and mailed to her  Initial call taken by: Omer Jack,  October 11, 2010 10:51 AM  Follow-up for Phone Call       Follow-up by: Judithe Modest CMA,  October 11, 2010 1:33 PM    Prescriptions: METOPROLOL TARTRATE 25 MG TABS (METOPROLOL TARTRATE) take one tablet two times a day  #60 x 1   Entered by:   Judithe Modest CMA   Authorized by:   Nathen May, MD, [facility]   Signed by:   Judithe Modest CMA on 10/11/2010   Method used:   [facility] ...       [facility]       [address]       [place], [place]  [address]       Ph: [phone]       Fax: [phone]   RxID:   [id number]

## 2010-10-27 ENCOUNTER — Telehealth: Payer: Self-pay | Admitting: Family Medicine

## 2010-11-07 LAB — SURGICAL PCR SCREEN: Staphylococcus aureus: NEGATIVE

## 2010-11-07 NOTE — Progress Notes (Signed)
Summary: Clarification on Klor-Con  Phone Note From Pharmacy Call back at [phone]   Caller: [facility] Call For: Dr. Milinda Antis  Summary of Call: Received fax from pharmacy asking for clarification on Klor-Con.  Form in your IN box.  Please advise. Initial call taken by: Linde Gillis CMA Duncan Dull),  October 27, 2010 8:52 AM  Follow-up for Phone Call        form done and in nurse in box  Follow-up by: Judith Part MD,  October 30, 2010 8:46 AM  Additional Follow-up for Phone Call Additional follow up Details #1::        completed form faxed to [phone] as instructed. Lewanda Rife LPN  October 30, 2010 10:10 AM     New/Updated Medications: KLOR-CON 10 10 MEQ CR-TABS (POTASSIUM CHLORIDE) 1 by mouth every other day Prescriptions: KLOR-CON 10 10 MEQ CR-TABS (POTASSIUM CHLORIDE) 1 by mouth every other day  #45 x 3   Entered and Authorized by:   Judith Part MD   Signed by:   Lewanda Rife LPN on 62/95/2841   Method used:   Historical   RxID:   [id number]

## 2010-12-15 ENCOUNTER — Telehealth: Payer: Self-pay | Admitting: Internal Medicine

## 2010-12-15 DIAGNOSIS — I10 Essential (primary) hypertension: Secondary | ICD-10-CM

## 2010-12-15 DIAGNOSIS — I472 Ventricular tachycardia: Secondary | ICD-10-CM

## 2010-12-15 MED ORDER — METOPROLOL TARTRATE 25 MG PO TABS: 25.0000 mg | ORAL_TABLET | Freq: Two times a day (BID) | ORAL | Status: DC

## 2010-12-15 MED ORDER — HYDROCHLOROTHIAZIDE 25 MG PO TABS: 25.0000 mg | ORAL_TABLET | Freq: Every day | ORAL | Status: DC

## 2010-12-15 NOTE — Telephone Encounter (Signed)
Addended by: Scherrie Bateman on: 12/15/2010 05:14 PM   Modules accepted: Orders

## 2010-12-15 NOTE — Telephone Encounter (Signed)
KRISTIN TO CALL PT   TO FIND OUT WHAT TYPE OF PHONE SERVICE PT ACTUALLY HAS AND IF NEEDS TO COME IN FOR DEVICE CHECK./CY

## 2010-12-18 NOTE — Telephone Encounter (Signed)
Previous Telephone encounter shows that this was completed by Dr. [name] Fleming nurse. Patient will call back if she does not receive them in the next day or two.

## 2010-12-21 ENCOUNTER — Encounter: Payer: Medicare Other | Admitting: *Deleted

## 2010-12-24 ENCOUNTER — Encounter: Payer: Self-pay | Admitting: *Deleted

## 2011-01-02 NOTE — Assessment & Plan Note (Signed)
[facility]                            CARDIOLOGY OFFICE NOTE   BRIEONNA, CRUTCHER                        MRN:          [id number]  DATE:09/29/2008                            DOB:          14-May-1951    Nathania returns today for followup.  I initially saw her on January 11 as  a new patient.  She had previously been seen by Dr. Antoine Poche Dr.  Diona Browner and Dr. Andee Lineman.  She had a previous TIA back in 2005.  At that  time, I did VT on her, and she had a patent ovale with no right-to-left  shunt.  She has been on Coumadin since that time.  She also has a  history of Takayasu disease with cardiomyopathy.  She had a heart cath  by Dr. Antoine Poche in 2005 with normal coronaries, and her LV function has  returned to normal.   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 Graciela Husbands for VT.   After her initial discussion, I was unsure of the continued indication  for Coumadin.  We did a hypercoagulability panel, which was negative.  I  had her see Dr. Welton Flakes for Hematology.  We spoke on the phone, and she  agreed that for the time being, she did not see an indication for  Coumadin.   After a lengthy discussion with Emmalou, I told her to stop her Coumadin  today.  I think the long-term risk of bleeding on Coumadin is higher  than her risk of recurrent stroke.   In talking to her, she also has been having some dizziness.  I suspect  she is currently being overmedicated.  She has a history of high blood  pressure, but her blood pressure has been fairly low lately.  We will  try to cut back on her other medications.   She continues to have some mild dyspnea.  There is a questionable  history of sarcoid lung disease.  She sees Dr. Shelle Iron in the past and  needs a followup appointment for this.   I told Kamdyn that for the time being I did not see any need to repeat an  echo or bubble study as she has not had any recurrent TIAs in 5  years.   CURRENT MEDICATIONS:  1. Coumadin to be stopped.  2. An aspirin a day.  3. Zocor 40 a day.  4. Ambien.  5. Metoprolol 50 b.i.d. to be decreased to 25 b.i.d.  6. Synthroid 50 mcg a day.  7. Altace or ramipril 10 mg a day, it would be decreased to 5 mg a      day.  8. HCTZ 25 a day.  9. KCL.   ALLERGIES:  She is allergic to LATEX.   PHYSICAL EXAMINATION:  GENERAL:  Her exam is remarkable for an  overweight black female in no distress.  VITAL SIGNS:  Her weight is 208, blood pressure is 100/60, pulse is 60  and regular, respiratory 14, afebrile.  HEENT:  Unremarkable.  Carotids are normal without bruit.  No  lymphadenopathy, thyromegaly, JVP, or elevation.  LUNGS:  Clear.  Good diaphragmatic motion.  No wheezing.  S1, S2.  Normal heart sounds.  PMI normal.  ABDOMEN:  Benign.  Bowel sounds positive.  No AAA.  No tenderness.  No  bruit.  No hepatosplenomegaly.  No hepatojugular reflux.  EXTREMITIES:  Distal pulse intact.  No edema.  NEURO:  Nonfocal.  SKIN:  Warm and dry.  No muscular weakness.   IMPRESSION:  1. Previous history of transient ischemic attack on chronic Coumadin      to be stopped today.  No evidence of hypercoagulability.  Continue      baby aspirin.  2. History of patent foramen ovale.  We will continue to follow if she      has any recurrent neurological problems off Coumadin.  She may be a      candidate for followup TE or bubble study and then possible      percutaneous closure.  3. Hypertension.  Blood pressure currently on the low side.  We will      decrease beta-blocker and Altace and see how she feels.  This may      be related to her dizziness.  4. 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  It looks as      though she has not been followed up since 2007.  I will have her      see Dr. Graciela Husbands and follow up to recheck her automatic implantable       cardioverter-defibrillator.  5. 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  Follow      up Dr. Shelle Iron.  6. Hypercholesterolemia.  Continue statin therapy.  Get a lipid      profile in 6 months.  7. Hypothyroidism.  Continue Synthroid replacement.  TSH per primary      care MD.  Overall, I think [name] is doing fine, and I think the      major decision to be made today was to take her off her Coumadin.     Noralyn Pick. Eden Emms, MD, [facility]  Electronically Signed    PCN/MedQ  DD: 09/29/2008  DT: 09/30/2008  Job #: [id number]

## 2011-01-02 NOTE — Assessment & Plan Note (Signed)
[facility]                         ELECTROPHYSIOLOGY OFFICE NOTE   MIKALYN, HERMIDA                        MRN:          [id number]  DATE:01/27/2008                            DOB:          1951-03-08    HISTORY:  Ms. 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  She has had no intercurrent  episodes.   Her major issue is recurrent daily headaches.  She does have a history  of PFO and raises the question of whether anything can be done to  ameliorate her headaches via that.  She saw something on TV last night  probably from Dr. Modesto Charon at [facility] addressing this.  She is interested in [place]  pursuing that.  She has had no intercurrent discharges.   MEDICATIONS:  1. Aspirin.  2. Coumadin.  3. Zocor.  4. Metoprolol 50 b.i.d.  5. Synthroid 50.  6. Altace 10.  7. Hydrochlorothiazide 25.  8. Amiodarone.  9. Potassium 10.   PHYSICAL EXAMINATION:  VITAL SIGNS:  Blood pressure is 99/61, her pulse  was 68.  LUNGS:  Clear.  HEART:  Sounds were regular.  EXTREMITIES:  Without edema.  NEUROLOGIC:  Grossly normal.   PROCEDURE:  Interrogation of her Guidant ICD demonstrates an R-wave of  8.9 with impedance of 565, a threshold 0.6 to 0.5.  Battery voltage 2.6.  There are episodes of nonsustained probably supraventricular tachycardia  as well as episodes of nonsustained ventricular tachycardia.   IMPRESSION:  1. 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  2. Nonsustained ventricular tachycardia.  3. Status post implantable cardioverter-defibrillator for the above.  4. Patent foramen ovale.  5. Chronic headaches.   PLAN:  From an arrhythmia point of view, Ms. Pardy is stable.  We will try and get up with Dr. Modesto Charon to see if anything is going on from  research or therapeutic point of view regarding PFO  closure and  recurrent headaches.   FOLLOW UP:  I will see her again in 1 years' time.     Duke Salvia, MD, [facility]  Electronically Signed    SCK/MedQ  DD: 01/27/2008  DT: 01/27/2008  Job #: [id number]

## 2011-01-02 NOTE — Assessment & Plan Note (Signed)
[facility]                            CARDIOLOGY OFFICE NOTE   [name], [name]                        MRN:          [id number]  DATE:12/12/2007                            DOB:          01-21-1951    PRIMARY CARE PHYSICIAN:  Marne A. Tower, MD.   REASON FOR VISIT:  Cardiac follow-up.   HISTORY OF PRESENT ILLNESS:  [name] comes back in for a routine  visit.  She had mentioned some problems with somewhat orthostatic  sounding dizziness which was overall mild but recurrent.  I suggested  that she stop her diuretic given normal blood pressure.  She did this  and checked her blood pressure, and actually found that came up into the  130s.  Otherwise, she continues to exercise at the gym using an  elliptical machine and other chair exercises without any palpitations,  angina or limiting breathlessness.  She had a BMET done recently through  Dr. Royden Purl office showing a sodium of 139, potassium 3.9, BUN 14,  creatinine is 0.8.  We talked about this today, and I think what we do  next is have her resume hydrochlorothiazide 12 mg daily and use a  potassium supplement 10 mEq every other day.   ALLERGIES:  DILANTIN.   MEDICATIONS:  1. Enteric-coated aspirin 81 mg p.o. daily.  2. Coumadin as [facility] by [facility].  3. Zocor 40 mg p.o. nightly.  4. Metoprolol 50 mg p.o. b.i.d.  5. Synthroid 50 mcg p.o. daily.  6. Lumigan eye drops.  7. Protonix 40 mg p.o. daily.  8. Altace 10 mg p.o. daily.  9. Hydrochlorothiazide 25 mg p.o. daily.  10.Potassium 10 mEq p.o. daily.   REVIEW OF SYSTEMS:  As described in the present of present illness.  The  patient does state that she had some arm and leg fatigue and was  wondering if it was related to Zocor.  She has had intolerances in the  past to Lipitor and Crestor.  We talked about this some today.   PHYSICAL EXAMINATION:  VITAL SIGNS:  Blood pressure 89/62, heart rate  60.  Weight is 204 pounds.  GENERAL:  The patient is comfortable in no acute distress.  No dizziness  at present.  HEENT:  Conjunctivae normal.  Oropharynx clear.  NECK:  Supple.  No elevated jugulovenous pressure.  No loud bruits.  LUNGS:  Clear without labored breathing.  CARDIAC:  Reveals a regular rate and rhythm.  No loud murmur or gallop.  EXTREMITIES:  Show no pitting edema.   IMPRESSION/RECOMMENDATIONS:  1. 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 Graciela Husbands.  The patient has a normal overall left      ventricular ejection fraction and is being managed medically on a      good regimen at this time.  She is due for device follow-up with      Dr. Graciela Husbands this summer.  2. In terms of her blood pressure control, what we will  try is to      reduce her hydrochlorothiazide to 12.5 mg daily and reduce her      potassium supplement to 10 mEq every other day.  I hope this will      allow her pressure to come up enough that she is not experiencing      dizziness, but not overshoot and become hypertensive.  3. Regarding her muscle weakness and soreness, I have asked her to      stop Zocor at least for a few weeks and see if her symptoms      improve.  If they return with rechallenge, we will more than likely      have to consider either decreasing her dose or changing to a      different medication, realizing that she has intolerances to both      Lipitor and Crestor in the past.     Jonelle Sidle, MD  Electronically Signed    SGM/MedQ  DD: 12/12/2007  DT: 12/12/2007  Job #: [id number]   cc:   Marne A. Milinda Antis, MD

## 2011-01-02 NOTE — Assessment & Plan Note (Signed)
[facility]                                 ON-CALL NOTE   [name], [name]                        MRN:          [id number]  DATE:02/15/2008                            DOB:          February 15, 1951    DATE OF INTERACTION:  February 15, 2008, at 3:19 p.m.  Phone number is [phone].   SUBJECTIVE:  The patient has pain in her side.  She has pain from the  right side down to the lower stomach, has had a diagnosis of diverticula  a long time ago, thinks she was given steroids for that but never  diagnosed.  She wonders whether she needs to be seen.   OBJECTIVE:  Abdominal pain.   PLAN:  I would suggest she increase her fiber.  If she develops fever or  increased pain go to the emergency room.  She had not had fever yet  today, has had a bowel movement today already; would try and increase  her fiber to try and get her bowels to move more, otherwise call in the  morning for an appointment to be seen.  Dr. Milinda Antis will be out of the  office this week, but someone will be able to see her.  Again, if things  worsen or she develops fever go to the emergency room.  Primary care  Makyia Erxleben is Dr. Milinda Antis, and home office is [facility].     Arta Silence, MD  Electronically Signed    RNS/MedQ  DD: 02/15/2008  DT: 02/16/2008  Job #: [id number]

## 2011-01-02 NOTE — Assessment & Plan Note (Signed)
[facility]                            CARDIOLOGY OFFICE NOTE   [name], [name]                        MRN:          [id number]  DATE:06/25/2007                            DOB:          1951/02/20    PRIMARY CARE PHYSICIAN:  Marne A. Milinda Antis, M.D.   REASON FOR VISIT:  Cardiac follow-up.   HISTORY OF PRESENT ILLNESS:  The patient comes in for a six-month visit.  She reports overall feeling fairly well.  She has not had any problems  with device discharges, palpitations, chest pain, or syncope.  Her  electrocardiogram today is normal showing sinus rhythm at 61 beats per  minute.  She did see Duke Salvia, MD, [facility] back in May for a device  check.  She continues on Coumadin [facility].  She does  state that she has felt somewhat dizzy sometimes with standing since  being on the hydrochlorothiazide and a note that her blood pressure is  relatively low normal today.  We talked about cutting her  hydrochlorothiazide and potassium supplements to every other day dosing.  Otherwise she reports some discomfort in her right leg predominantly  behind her knee and in the posterior aspect of her thigh and calf  particularly when she sits for a long period of time.  She has had no  major asymmetric swelling in this area.  No progressive shortness of  breath.   ALLERGIES:  DILANTIN.   CURRENT MEDICATIONS:  1. Enteric-coated aspirin 81 mg p.o. daily.  2. Coumadin as directed by [facility].  3. Zocor 40 mg p.o. daily.  4. Ambien 10 mg p.o. daily.  5. Metoprolol 50 mg p.o. b.i.d.  6. Synthroid 50 mcg p.o. daily.  7. Lumigan eye drops.  8. Protonix 40 mg p.o. daily.  9. Altace 10 mg p.o. daily.  10.Hydrochlorothiazide 12.5 mg p.o. daily.  11.Potassium 10 mEq p.o. daily.   REVIEW OF SYSTEMS:  As described in history of present illness.   PHYSICAL EXAMINATION:  VITAL SIGNS:  Blood pressure today is 108/68,  heart rate is 64, and  weight is 219 pounds up 5 pounds from the last  visit.  This is an overweight woman in no acute distress.  HEENT:  Conjunctivae and lids normal.  Oropharynx is clear.  NECK:  Supple.  No elevated jugular venous pressure.  No loud bruits.  No thyromegaly is noted.  LUNGS:  Clear to auscultation without labored breathing.  HEART:  Regular rate and rhythm.  No S3 gallop or loud murmur.  ABDOMEN:  Soft and nontender.  EXTREMITIES:  No pitting edema.  No obvious cords.  On palpation of the  right lower extremity, some superficial varicosity noted.  Distal pulses  are 1+.  SKIN:  Warm and dry.  MUSCULOSKELETAL:  No kyphosis is noted.  NEUROPSYCHIATRIC:  The patient is alert and oriented x3.  Affect is  normal.   IMPRESSION:  1. 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  She will      continue on medical therapy including beta blocker and return to      see Dr. Graciela Husbands as directed over the next six months.  She also      continues on transtelephonic monitoring.  I will see her back over      the next six months as well.  2. Prior history of pulmonary emboli and cerebrovascular disease with      patent foramen ovale, on Coumadin.  The patient continues to follow      with Dr. Sandria Manly.  She has had some right lower extremity pain.  Given      her history of thromboembolic disease, we will refer her for follow-      up venous Dopplers, also to exclude a Baker's cyst.  She may be      describing neuropathic pain if these issues are not found.  We will      call her with the results.  3. Hypertension, very well controlled.  In fact, somewhat dizzy with a      low normal blood pressure.  I have asked her to cut her      hydrochlorothiazide and potassium supplements back to every other      day dosing.     Jonelle Sidle, MD  Electronically  Signed    SGM/MedQ  DD: 06/25/2007  DT: 06/25/2007  Job #: [id number]   cc:   Marne A. Milinda Antis, MD

## 2011-01-02 NOTE — Assessment & Plan Note (Signed)
[facility]CARE                            CARDIOLOGY OFFICE NOTE   MIYANNA, WIERSMA                        MRN:          [id number]  DATE:12/30/2006                            DOB:          [birth year]    PRIMARY CARE PHYSICIAN:  Marne A. Milinda Antis, MD.   PULMONOLOGIST:  Barbaraann Share, MD,FCCP.   NEUROLOGIST:  Genene Churn. Love, M.D.   REASON FOR VISIT:  Cardiac followup.   HISTORY OF PRESENT ILLNESS:  Ms. Prochazka was last seen in February.  At  that time she was given a prescription for Altace and was asked to  arrange follow up with Dr. Sandria Manly given continued complaints of  intermittent headaches.  She states that symptomatically she has  episodes of vertigo, occasionally, as well as continued intermittent  headaches.  She has not yet seen Dr. Sandria Manly in clinic.  From a cardiac  perspective, she reports that her systolic blood pressures at home are  usually in the 120 to 140 range.  She has been taking her Altace  regularly a report.  She has transtelephonic defibrillator follow up  with Dr. Graciela Husbands, now on a once-weekly basis.  She reports no device  discharges.   ALLERGIES:  DILANTIN.   PRESENT MEDICATIONS:  1. Enteric-coated aspirin 81 mg p.o. daily.  2. Coumadin as directed by the Coumadin clinic.  3. Zocor 40 mg p.o. daily.  4. Ambien 10 mg p.o. q.h.s.  5. Metoprolol 50 mg p.o. b.i.d.  6. Synthroid 50 mg p.o. daily.  7. Lumigan eye drops  8. Protonix 40 mg p.o. daily.  9. Altace 10 mg p.o. daily.   REVIEW OF SYSTEMS:  As in the history of present illness.  She has had  no visual changes.  No focal weakness.   PHYSICAL EXAMINATION:  VITAL SIGNS:  Blood pressure 142/78, respiratory  rate 66, weight is 218 pounds which is stable.  GENERAL:  The patient is comfortable in no acute distress.  HEENT:  Conjunctivae are normal.  Pharynx is clear.  NECK:  Supple.  No elevated jugular venous pressure.  Without bruits.  No thyromegaly.  LUNGS:  Clear  without labored breathing at rest.  CARDIAC EXAM:  Reveals a regular rate and rhythm.  A soft systolic  murmur.  No S3 gallop or pericardial rub.  ABDOMEN:  Soft, nontender, normoactive bowel sounds.  EXTREMITIES:  Showed no pitting edema.  Distal pulses are 2+.  SKIN: Warm and dry.  MUSCULOSKELETAL:  No kyphosis is noted.  NEUROPSYCHIATRIC:  Patient alert and oriented x3.   IMPRESSION/RECOMMENDATIONS:  1. 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 Graciela Husbands      in the past.  She continues with transtelephonic monitoring, and      has had no device discharges.  Beta blocker therapy continues.  2. History of previously documented pulmonary sarcoidosis (this is not      biopsy proven) followed by Dr. Shelle Iron.  3.  History of previously documented pulmonary emboli and      cerebrovascular disease with patent foramen ovale, on Coumadin, as      directed by the Cou[facility]in clinic.  She has prior history of cerebral      aneurysms, and is also followed by Dr. Sandria Manly.  4. Hypertension, not optimally controlled.  I have added      hydrochlorothiazide 25 mg p.o. daily, along with potassium 10 mEq      p.o. daily.  We will have her follow-up in two weeks for a BMET.  I      will otherwise plan to see her back over the next six months.     Jonelle Sidle, MD  Electronically Signed    SGM/MedQ  DD: 12/30/2006  DT: 12/30/2006  Job #: [id number]   cc:   Marne A. Milinda Antis, MD  Barbaraann Share, MD,FCCP  Genene Churn. Love, M.D.

## 2011-01-02 NOTE — Assessment & Plan Note (Signed)
[facility]                            CARDIOLOGY OFFICE NOTE   IRIDIAN, READER                        MRN:          [id number]  DATE:08/23/2008                            DOB:          [birth year]    [name] is a new patient to me.  She has previously been seen  by Dr. Andee Lineman and Dr. Diona Browner.  She is a complicated patient.  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  She was cathed by Dr. Antoine Poche and did not have epicardial  coronary disease.  She carries a diagnosis of cardiomyopathy with an EF  of 35%.   Subsequently in April 2005, she had a question of a TIA, at that time,  there had been also a history of a small pulmonary emboli in the right  lower lobe.  She carries a diagnosis of antiphospholipid syndrome, but I  cannot actually find this lab work.   The patient has been on Coumadin since 2005.  There is no time table as  far as I can tell for stopping it.  Interestingly, at the time of her  TIA which involves some right-sided weakness, I performed a TEE on her  and she had a fairly significant patent foramen ovale. There was no  right-to-left shunting and a negative bubble study.   The TIA symptoms were somewhat vague.  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   I told the patient that I was not sure of the long-term plan reading  through Dr. Margarita Mail and Dr. Ival Bible notes, there does not seem to be  any incline towards further workup in regards to either closing the PFO,  stopping her Coumadin, or reassessing her hematological status.   I told her that I would have to review her hospital notes and also the  TEE that I have performed.  There is a possibility that if she does not  have a hematological abnormality that she may benefit from closure of  the PFO  and stopping her Coumadin.   Talking to the patient, she has not had any significant palpitations or  history of atrial fibrillation.  There have been no recurrent TIAs.  She  does get occasional headaches and needs to be careful as previously this  was.  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  She sees Dr.  Channing Mutters and Dr. Sandria Manly on a regular basis.   She has not had any significant chest pain or recurrent TIAs.   From a cardiac perspective, she has not had a followup stress test and  did not have epicardial disease.  Her EF would appear to have her  resolved back towards normal by more recent echo.   Her past medical history is otherwise remarkable for history of gastric  ulcer and rectal bleeding.  She has a history of hysterectomy and  arthroscopy.  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   The patient is retired from [employer].  She lives alone.  She does have  2 children.  She does not smoke or drink.  She is otherwise fairly  sedentary.   ROS: otherwise negative except as noted above   CURRENT MEDICATIONS:  1. An aspirin a day.  2. Coumadin as directed.  3. Zocor 40 a day.  4. Ambien 10 a day.  5. Metoprolol 50 b.i.d.  6. Synthroid 50 mcg day.  7. Lumigan eye drops.  8. Altace 10 a day.  9. Hydrochlorothiazide 25 a day.  10.KCl 10 a day.   ALLERGIES:  She is allergic to LATEX.   She has p.r.n. Allegra, Vicodin, Nasonex, and nitro.   PHYSICAL EXAMINATION:  GENERAL:  Remarkable for an overweight black  female in no distress.  VITAL SIGNS:  Her weight is 205, blood pressure is 100/60, pulse 60 and  regular, respiratory rate 14, afebrile.  HEENT:  Unremarkable.  NECK:  Carotids normal without bruit.  No lymphadenopathy, thyromegaly,  or JVP elevation.  She has a scar  over the right aspect of her forehead  from her previous surgery.  No lymphadenopathy or JVP elevation.  HEENT:  Otherwise unremarkable.  LUNGS:  Clear with good diaphragmatic motion.  No wheezing.  S1 and S2.  Normal heart sounds.  PMI normal.  ABDOMEN:  Benign.  Bowel sounds positive.  No AAA, no tenderness, no  bruit, no hepatosplenomegaly, no hepatojugular reflux, or tenderness  status post hysterectomy.  EXTREMITIES:  Distal pulses intact.  No edema.  NEURO:  Nonfocal.  SKIN:  Warm and dry.  MUSCULOSKELETAL:  No muscular weakness.   Her baseline EKG is normal.  Most recent was done October 24, 2007, and  reviewed.   IMPRESSION:  1. History of transient ischemic attack in the setting of patent      foramen ovale, question anticardiolipin syndrome.  Continue      Coumadin.  We will repeat a 2-D echocardiogram with bubble study to      assess her patent foramen.  The size is described on her previous      transesophageal echocardiography.  It is large and she may benefit      from closing of the patent foramen ovale.  2. Anticoagulation.  It is not clear to me that she needs lifelong      anticoagulation.  I will have to look through her lab work to see      if she truly had a hypercoagulability panel.  Again, further      testing of any hypercoagulable state would probably entail stopping      her Coumadin and I would not necessarily do this unless she appears      to have a smaller PFO where it is closed percutaneously.  3. History of ophthalmologic aneurysm with clip.  Warned the patient      not to have an MRI scan.  She said Dr. Channing Mutters performed a recent      cerebral angiogram which showed no evidence of bleeding and the      clip was in good position.  However, I suspect this can be a cause      of chronic headaches for her.  4. History of cardiomyopathy in the setting of previous Tako-tsubo  disease.  No evidence of epicardial disease.  We will reassess her      left  ventricular function by echo when she has her bubble study.  5. Previous pulmonary emboli.  Reviewing the CT scan, it was smaller      in the right lower lobe.  No evidence of pulmonary hypertension or      residual chronic lung disease.  Again from a pulmonary emboli      standpoint, she does not need to be on chronic Coumadin.  6. Previous history of gastric ulcers and rectal bleeding.  No      evidence of melena in the stool; however, puts her at longer term      risk of bleeding problems on chronic Coumadin.   I told the patient I would call her after I see her echocardiogram and  bubble stutter and make further recommendations.  She may need a  followup TEE to assess whether her PFO is amenable to percutaneous  closure.     Noralyn Pick. Eden Emms, MD, [facility]  Electronically Signed    PCN/MedQ  DD: 08/23/2008  DT: 08/24/2008  Job #: [id number]

## 2011-01-02 NOTE — Assessment & Plan Note (Signed)
[facility]                            CARDIOLOGY OFFICE NOTE   AEISHA, MINARIK                        MRN:          [id number]  DATE:08/30/2008                            DOB:          11-Aug-1951    Ms. Cimone returns today for followup.  She is a complicated patient.  I  saw her as a new patient to me January 4.   She previously have been seen by Dr. Andee Lineman, Dr. Diona Browner, and Dr.  Antoine Poche.  She is on chronic Coumadin therapy.  It was little bit well  during to me in terms of the workup for this.  She did have a TIA back  in 2005, at which point she was found to have a small PFO.  I remember  doing a TE on her which showed no right-to-left shunt.  There is also  questionable history of a small pulmonary emboli at the same admission  in 2005.  She carries a diagnosis of anti-phospholipid syndrome, but I  cannot find any actual lab work that shows this.   She has been on Coumadin since 2005.   She also has a history of Tako-Tsubo disease with cardiomyopathy.  An EF  in the 35% range, but no epicardial coronary disease by cath by Dr.  Antoine Poche back in 2005.   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   The patient had a followup echocardiogram today.  Her LV function was  normal at 60%.  She had an AICD catheter placed.  This was done by Dr.  Graciela Husbands for VT in question of sarcoid heart.  She had evidence for small  PFO by color-flow, but bubble study was once again negative.  I told  Erminia I am not 100% certain about the fact that she is on Coumadin.  She  is only 43 and would indicate another 10 or 20 years of Coumadin.  I  would like to do some more testing on her.  I think we can check her for  cardiolipin antibody and anti-phospholipid syndrome, lupus,  anticoagulant, and antithrombin III while she is on Coumadin.  I will  for her to Hematology for further workup to assess for protein C and  protein S deficiency or Factor V Leiden that she needs to be off her  Coumadin.   I am concerned that we do not have a firm enough diagnosis for her to be  on lifelong Coumadin.  I do not think her PFO needs to be closed.  There  was no shunting when last seen.  She has not had a recurrent event and  the PFO appeared small by transthoracic with another negative bubble  study.   I went over all of these decision-making processes with the patient in  regards to not needing a PFO closure and in regards to further workup of  her hypercoagulable state before continuing her on lifelong Coumadin.  I  explained to her that she would only need to be on Coumadin for year for  pulmonary emboli  and she is now 4 years out from this.  From a  neurological standpoint, she has also had an ophthalmic artery aneurysm  with clipping by Dr. Channing Mutters, this may have more to do with her neurological  symptoms than anything else.  Since she has had a neurological problem  with the clipping of an aneurysm, I think this is another reason to  potentially get her off Coumadin.   Review of systems remarkable for an occasional cough, no hemoptysis.  No  sputum production.  No fever.  She has chronic mild exertional dyspnea  likely from sarcoid lung disease.  She does have an chest pain,  palpitations, PND, or orthopnea.   She does not have any recurrent TIAs.   CURRENT MEDICATIONS:  1. Coumadin as directed.  2. Aspirin a day.  3. Zocor 40 a day.  4. Ambien 10 a day.  5. Metoprolol 50 b.i.d.  6. Synthroid 50 a day.  7. Lamotrigine.  8. Altace 10 a day.  9. HCTZ 25 a day.  10.KCL.   ALLERGIES:  She is allergic to LATEX.   FAMILY HISTORY:  There is a marked positive family history for  cardiomyopathy.   PHYSICAL EXAMINATION:  GENERAL:  She is an overweight black female.  VITAL SIGNS:  Weight is about 206, blood pressure is 110/60, pulse 60  and regular, respiratory 14, afebrile.  HEENT:  Unremarkable.   NECK:  Carotids normal without bruit.  No lymphadenopathy, thyromegaly,  or JVP elevation.  LUNGS:  Clear to good diaphragmatic motion.  No wheezing.  HEART:  S1 and S2 normal heart sounds.  PMI normal.  ABDOMEN:  Benign.  Bowel sounds positive.  No AAA.  No tenderness.  No  bruit.  No hepatosplenomegaly.  No hepatojugular reflux.  No tenderness.  No edema.  NEUROLOGIC:  Nonfocal.  SKIN: Warm and dry.  MUSCULOSKELETAL:  No muscular weakness.   EKG is normal.   IMPRESSION:  1. Previous history Tako-Tsubo disease.  No recurrent chest pain.  EF      returned to normal.  Continue current therapy with aspirin.  2. Chronic dyspnea related to sarcoid lung.  Followup pulmonary      doctors, lung exam, currently stable.  3. Previous history of transient ischemic attack with patent foramen      ovale.  No right-to-left shunt on echo today.  No indication for      closer PFO.  4. Question hypercoagulability state with chronic Coumadin therapy.      Check part of her hypercoagulability panel today referred to      Hematology.  Consider stopping Coumadin to recheck the remainder of      it.  Again long discussion with the patient, I am not sure she      should be on long-term anticoagulation verses antiplatelet therapy.  5. Hypertension currently well controlled.  Continue low-sodium diet.      Current dose Altace.  6. Hypothyroidism.  Continue Synthroid.  TSH per primary care MD.  7. Hyperlipidemia.  Continue Zocor.  Last LDL was under 80 in 2008.      Follow up liver profile.   Overall, I think the patient's cardiac status is stable based on results  of her blood work and hematology referral.     Theron Arista C. Eden Emms, MD, [facility]  Electronically Signed    PCN/MedQ  DD: 08/30/2008  DT: 08/31/2008  Job #: [id number]

## 2011-01-02 NOTE — Assessment & Plan Note (Signed)
[facility]                         ELECTROPHYSIOLOGY OFFICE NOTE   [name], [name]                        MRN:          [id number]  DATE:01/16/2007                            DOB:          [birth year]    [name] returns.  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  She has had no  intercurrent defibrillator discharges.   She does have a history of pulmonary emboli, positive anticardiolipin  antibody, PFO, and she is on Coumadin.   She has had no intercurrent discharges, as noted.  She is doing fine.   Her medications are reviewed and are notable for the intercurrent  addition of hydrochlorothiazide and potassium because of hypertension.  I should note that she did not tolerate the 25-mg dose and cut it in  half.  Her metoprolol, Coumadin, aspirin and Zocor are stable.  On  examination, her blood pressure is 127/76, pulse was 61.  Lungs were  clear, heart sounds were regular, extremities were without edema.   Interrogation of her Guidant T135 ICD demonstrates an R wave of 8.4 with  impedance of 551, a threshold of 0.6 at 0.5, battery voltage is 2.95.  There are no intercurrent episodes.   IMPRESSION:  1. Nonsustained ventricular tachycardia.  2. 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  3. Status post implantable cardioverter-defibrillator for the above.  4. Hypertension.   Her blood pressure today is better.  She is to get her BMET in a week or  two.  We will plan to see her in a year in a device clinic and we will  follow her remotely quarterly in the interim.   She will follow up with Dr. Diona Browner as scheduled.     Duke Salvia, MD, [facility]  Electronically Signed    SCK/MedQ  DD: 01/16/2007  DT: 01/16/2007  Job #: [id number]

## 2011-01-02 NOTE — Assessment & Plan Note (Signed)
[facility]                            CARDIOLOGY OFFICE NOTE   CHEYENE, HAMRIC                        MRN:          [id number]  DATE:10/24/2007                            DOB:          [birth year]    PRIMARY CARE PHYSICIAN:  Dr. Roxy Manns   REASON VISIT:  Cardiac follow-up.   HISTORY OF PRESENT ILLNESS:  [name] returns for a 13-month visit.  She  has not had any new problems.  She states that she is working out in a  gym and enjoying this.  She does still have some problems with  occasional dizziness and has continued on her hydrochlorothiazide daily  in addition to Altace and metoprolol.  I note that her blood pressure is  borderline low.  We once again talked about discontinuing this  medication, thinking that she may be somewhat volume depleted.  I  suspect her blood pressure control will not be adversely impacted to any  large degree.  Her electrocardiogram shows sinus bradycardia.  She has  been on antibiotic for recent sinus infection and is due for an INR  check today on Coumadin.  She is not had any obvious bleeding problems.  No device discharges have been noted.  The patient has occasional  palpitations.  She is due to see Dr. Graciela Husbands back this summer and has  transtelephonic device evaluation in place.   ALLERGIES:  DILANTIN.   PRESENT MEDICATIONS:  1. Enteric coated aspirin 81 mg p.o. daily.  2. Coumadin as directed by the Coumadin clinic.  3. Zocor 40 mg p.o. daily.  4. Ambien 10 mg p.o. daily.  5. Metoprolol 50 mg p.o. b.i.d.  6. Synthroid 50 mcg p.o. daily.  7. Lumigan eyedrops.  8. Protonix 40 mg p.o. daily.  9. Altace 10 mg p.o. daily.  10.Hydrochlorothiazide 12.5 mg p.o. daily.  11.Potassium 10 mEq p.o. daily.   REVIEW OF SYSTEMS:  As described in the history of present illness and  otherwise negative.   EXAMINATION:  Blood pressure is 99/62, heart rate is 54, weight is 207  pounds which is down from 219 back in  November.  The patient is  comfortable in no acute distress.  NECK:  Reveals no elevated jugular venous pressure, no loud bruits, no  thyromegaly is noted.  LUNGS:  Clear without labored breathing at rest.  CARDIAC EXAM:  Reveals a regular rate and rhythm.  No S3 gallop or loud  murmur.  ABDOMEN:  Soft, nontender.  EXTREMITIES:  Show no significant pitting edema.   IMPRESSION/RECOMMENDATION:  1. 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 Graciela Husbands.  Left ventricular systolic function is      overall normal.  We plan to continue medical therapy including beta      blocker and ACE inhibitor.  I have asked her to discontinue      hydrochlorothiazide and potassium supplementation, given her      feeling of occasional dizziness.  I suspect she may  have some      relative volume depletion.  She will otherwise continue      transtelephonic monitoring and see Dr. Graciela Husbands back in June.  I have      encouraged her to continue her regimen of regular exercise.  2. Previously-documented pulmonary emboli and cerebrovascular disease      with patent foramen ovale.  The patient continues on chronic      Coumadin and is due for a follow-up INR today, particularly in      light of her recent antibiotic use for a sinus infection.  3. History of hypertension, very well controlled.     Jonelle Sidle, MD  Electronically Signed    SGM/MedQ  DD: 10/24/2007  DT: 10/26/2007  Job #: [id number]   cc:   Marne A. Milinda Antis, MD

## 2011-01-05 NOTE — Discharge Summary (Signed)
NAME:  [name], [name]                  ACCOUNT NO.:  [id number]   MEDICAL RECORD NO.:  [id number]                   PATIENT TYPE:  INP   LOCATION:  4729                                 FACILITY:  [facility]   PHYSICIAN:  Learta Codding, M.D. [facility]             DATE OF BIRTH:  February 08, 1951   DATE OF ADMISSION:  01/13/2004  DATE OF DISCHARGE:  01/22/2004                                 DISCHARGE SUMMARY   DISCHARGE DIAGNOSES:  1. Admitted with progressive lightheadedness/weakness accompanied by chest     pressure.  2. Non-Q wave myocardial infarction with regional wall motion abnormalities,     venous troponin I is 4.15, no obstructive cardiac disease by     catheterization.  3. Normal echocardiogram on November 22, 2003, ejection fraction 55 to 65%.  4. Two-dimensional echocardiogram May 30, ejection fraction 55 to 65%.  5. Small pulmonary embolism right lower lobe by computed tomogram, no deep     venous thrombosis  by ultrasound.  6. Nonsustained ventricular tachycardia, inducible by electrophysiology     study.  7. 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  8. 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  9. Myopathy on Crestor 20 mg daily.  The patient stopped this shortly.  The     patient had been prescribed this two to three weeks prior to admission     and stopped it shortly prior to admission for muscle aching.  10.      Mild to moderate mediastinal/hilar lymphadenopathy, possible     sarcoidosis, follow-up CT scan in three months.  11.      Patent foramen ovale with only left to right shunting by     transesophageal echocardiogram.   SECONDARY DIAGNOSIS:  1. History of left brain transient ischemic attack in April of 2005.  2. History of diplopia secondary to intracranial aneurysm with clipping of     the ophthalmic artery in 2002.  3. Hypertension.  4. Dyslipidemia.  5. Gastric ulcers, status post gastrointestinal bleed in 2004.  6.  Glaucoma.  7. Total abdominal hysterectomy.   PROCEDURE:  1. Left heart catheterization Jan 14, 2004, by Rollene Rotunda, M.D.  Study     shows that the left main was free of disease, the left anterior     descending was free of disease. There was a small first diagonal which     was normal, a large second diagonal which was normal. The left circumflex     was normal.  The first obtuse marginal was small. The second obtuse     marginal was moderate.  The third obtuse marginal large, all normal.     Right coronary artery is dominant and normal and moderate size.  The PDA     and PL branches are normal.  The left ventricular ejection fraction is     50% with anterior and anterolateral mild akinesis, mid inferior akinesis.     Conclusion  is non-Q wave myocardial infarction with regional wall motion     abnormalities, no obstructive coronary artery disease.  2. Computed tomogram of the chest with small pulmonary embolism right lower     lobe.  3. Jan 17, 2004, two-dimensional echocardiogram with ejection fraction 55 to     65%.  4. Transesophageal echocardiogram on May 31.  There is a moderate size PFO     with left to right shunting, no right to left shunting, no left atrial     appendage thrombus, and no aortic debris.  5. January 21, 2004, implantation of ICD. It is a Guidant VITALITY DS VB model     T135 VR, serial number [id number].   PLAN:  1. Follow up with Coumadin Clinic on Wednesday, January 26, 2004.  2. Pacer Clinic at [facility] in two weeks.  3. Follow up with Dr. Andee Lineman in three to four weeks as an office visit at     [facility] in [place].  4. Three months repeat computed tomogram of the chest to check for     mediastinal and hilar adenopathy, possibly fiberoptic bronchoscopy or     mediastinoscopy to obtain tissue biopsy to rule out sarcoid versus     lymphoma.   DISPOSITION:  The patient is ready for discharge post procedure day #1 after  undergoing  implantation of implantable cardioverter defibrillator.  The  pocket site for the generator is healing nicely. There is no evidence of  ecchymosis, swelling, or tenderness.  The patient is in sinus rhythm at the  time of discharge. She is alert and oriented x3.  She is afebrile.  Hemodynamics are stable.   DISCHARGE MEDICATIONS:  1. Zocor 40 mg daily at bedtime.  The patient had been on Crestor 20 mg     daily, but had stopped due to a myopathy.  2. Prevacid 30 mg daily.  3. Neurontin 100 mg every eight hours.  4. Altace 20 mg daily.  5. Ambien 10 mg daily a bedtime.  6. Xalatan eye drops as before.  7. Lopressor 50 mg 1/2 tablet in the morning and 1/2 tablet in the evening.  8. Coumadin 5 mg daily or as directed by the Coumadin Clinic.  9. Baby aspirin 81 mg daily.  10.      Nitroglycerin 0.4 mg one tablet under the tongue every five minutes     x3 doses as needed for chest pain.  11.      For pain at the catheterization site, Tylenol 325 mg one to two     tablets every four to six hours as needed.   ACTIVITY:  The patient has been given a supplemental discharge sheet which  covers the range of motion in the left upper extremity after placement of an  ICD.  The patient is asked not to drive for the next 10 days and she is to  sponge bathe only for the next week.   DIET:  Low sodium, low cholesterol diet.   FOLLOW UP:  1. She has a visit at [facility] at [facility], [address], on Wednesday, June 8 to obtain blood level of Coumadin.  2. Pacer Clinic visit on June 13 at 9:30 a.m.  3. She will see Dr. Andee Lineman in three weeks, his office will call with an     appointment.  4. She will see Dr. Graciela Husbands on September 14, at 3:20 p.m.   HISTORY  OF PRESENT ILLNESS:  The patient is a 60 year old African-American  female.  She has a past medical history of hypertension, hypercholesterolemia, and TIA.  The patient required a prior hospitalization  for TIA  evaluation.  She is status post aneurysmal clipping in 2002 of the  ophthalmic artery.  The patient on May 27, experienced palpitations  associated with lightheadedness and generalized weakness.  She states the  episodes occur in bursts, they occur all of a sudden, last 5 to 10 minutes,  then get somewhat better, and then start up again.  She has had these  complaints in the past, but in the last 10 hours or so, her symptoms have  become much worse.  She is also experiencing chest pressure associated with  tightness in the left arm and tightness in the throat.  At the present time,  during examination, she still has some chest tightness.  There are no  initial EKG changes.  The patient was then brought in by EMS and a long  rhythm strip was obtained documenting a dysrhythmia.  Unfortunately, the  rhythm strip is currently not available.  The patient will be admitted  through the emergency room at [facility].  She will be started on  IV heparin, cardiac enzymes will be cycled to rule out myocardial  infarction.   HOSPITAL COURSE:  The patient was seen in the emergency room at [facility] by [facility], Dr. Andee Lineman.  She was having chest tightness  and episodes of dizziness.  Her cardiac enzymes came back elevated and a  venous troponin I of 4.15.  She was taken to the catheterization lab on the  same day, May 27, and she study has been dictated above.  Essentially, she  has nonobstructive coronary artery disease with ejection fraction of 50%  with regional wall motion abnormalities.  The patient was then started on  Norvasc.  The patient had a CT of the chest which showed small pulmonary  embolism in the right lower lobe.  Because of this, the patient underwent a  hypercoagulability series including anticardiolipin antibodies, Factor V  Leiden, prothrombin gene mutation, homocysteine, and lupus anticoagulant.  Factor V Leiden was negative, however, lupus  anticoagulant was detected.  Homocysteine was 9.36 within normal range.  Cardiolipin antibodies were all  within normal range.  The patient also had a lower extremity ultrasound  which was negative for deep venous thrombosis.  This was done on May 30.  She also underwent Transesophageal echocardiogram to image PFO with global  study.  No right to left shunting was noted.  There was no left atrial  appendage thrombus and no aortic debris.  On May 31, she had a pulmonary  consult.  Since the computer tomogram also showed mediastinal and hilar  lymphadenopathy, the plan for pulmonary is for no intervention at this time  and to continue anticoagulation which has been started for this patient in  the setting of pulmonary embolism.  A follow-up CT in three months was  planned.  If no lymphadenopathy, no further workup needed.  If  lymphadenopathy persistent, then mediastinoscopy versus fiberoptic bronchoscopy for tissue diagnosis is recommended.  The patient could come  off Coumadin and be placed on IV heparin prior to this study. The patient  also had a resting thallium study done here which was consistent with  infiltrative cardiac disorder, possibly sarcoidosis.  There is some thought  that the patient might be experiencing cardiac sarcoidosis which apparently  can  occur without clinical manifestations of pulmonary sarcoidosis.  Ventricular tachycardia can be a presenting circumstance in these cases.  In  an effort to obtain a etiology for the patient's cardiomyopathy, the  diagnosis of cardiac sarcoid was entertained.  Unfortunately MRI was not an  option secondary to the patient's prior aneurysm clipping in 2002.  Certainly, the patient has multiple risk factors for a sudden cardiac death  including nonsustained ventricular tachycardia, an active process causing  cardiomyopathy which is at present of unknown etiology.  In this setting,  Doylene Canning. Ladona Ridgel, M.D. was consulted for a possible EPS  study, placement of  ICD.  If the  study were positive, an ICD was placed, the patient will also continue on  Coumadin, beta blocker, and have follow-up fiberoptic bronchoscopy with  biopsy or mediastinoscopy in three months to rule out sarcoid versus  lymphoma.  The patient goes home on long-term Coumadin after implantation of  Guidant VITALITY ICD.      Maple Mirza, P.A.                    Learta Codding, M.D. [facility]    GM/MEDQ  D:  01/22/2004  T:  01/24/2004  Job:  [id number]   cc:   Marne A. Milinda Antis, M.D. [facility]   Genene Churn. Love, M.D.  [address]  [address]  [place]  [place] [address]  Fax: [phone]   Marcelyn Bruins, M.D. [facility]   Learta Codding, M.D. [facility]   Duke Salvia, M.D.

## 2011-01-05 NOTE — Consult Note (Signed)
NAME:  [name], [name]                  ACCOUNT NO.:  [id number]   MEDICAL RECORD NO.:  [id number]                   PATIENT TYPE:  INP   LOCATION:  0160                                 FACILITY:  [facility]   PHYSICIAN:  James L. Malon Kindle., M.D.          DATE OF BIRTH:  03-Sep-1950   DATE OF CONSULTATION:  DATE OF DISCHARGE:                                   CONSULTATION   REASON FOR CONSULTATION:  Rectal bleeding.   HISTORY:  This is a 60 year old black female who had been well until  yesterday.  She was in a meeting, felt that she had to have a bowel  movement, went and passed stool and clots.  She went home, had several more  bowel movements, passing dark blood and clots, never any bright blood.  She  presented to the emergency room with this history.  She has not been  orthostatic, has had no postural changes, has felt a bit weak.  Her  hemoglobin was over 12 when she came in, has dropped slightly.  She has had  one or two more loose stools with clot since coming into the hospital.  Most  recent hemoglobin was 10.3, down from 12.4 (she has been on Vioxx for three  weeks.  This was given to her by Dr. Corliss Skains for arthritis.  She has had  some heartburn and is not taking anything for heartburn.   CURRENT MEDICATIONS:  Vioxx, Altace, aspirin, multivitamins.   ALLERGIES:  DILANTIN.   MEDICAL HISTORY:  1. History of a colonoscopy three years ago by myself for cancer screening.     The patient reports that this was negative.  We do not have the report     currently available in the computer.  2. 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  3. Hysterectomy.  4. Knee arthroscopies.  5. History of hypertension.  6. History of fatty liver, was in a drug study by Dr. Kinnie Scales.   FAMILY HISTORY:  Noncontributory.   SOCIAL HISTORY:  She does not smoke or drink.   REVIEW OF SYSTEMS:  She did have a peptic ulcer 20 years ago, does not  remember the details.  She has  arthritis.  She had palpitations of her heart  on occasion.  Has been told she has a fatty liver and high cholesterol.  She  has no problems with bleeding.  Does have chronic headaches.  Her bowel  movements are unremarkable.   PHYSICAL EXAMINATION:  VITAL SIGNS:  Temperature 98.4, pulse 74, blood  pressure 148/74.  GENERAL:  Alert black female in no acute distress.  HEENT:  Eyes clear.  Nonicteric.  NECK:  Supple.  No lymphadenopathy.  LUNGS:  Clear.  HEART:  Regular rate and rhythm.  Without murmurs or gallops.  ABDOMEN:  Soft, nontender, nondistended.  RECTAL:  Not repeated.  It was grossly positive in the emergency room.   ASSESSMENT:  Acute gastrointestinal bleed with history  of negative  colonoscopy a couple of years ago.  My suspicion is that this is an upper  gastrointestinal bleed.  This is suggested by the heartburn she has been  having, and I suspect she has a Vioxx-induced ulcer.  She does have a  history of fatty liver so I suppose it is possible that she could have  cirrhosis and varices.  I think an upper endoscopy would be the first place  to start.   PLAN:  Will go ahead with an upper endoscopy later today as soon as she can  be fitted in the schedule.  I have discussed this with her, and she is  agreeable.                                               James L. Malon Kindle., M.D.    Waldron Session  D:  03/30/2003  T:  03/30/2003  Job:  [id number]   cc:   Sanjeev K. Corliss Skains, M.D.  [address]  [place]  [place]  [address]  Fax: [phone]

## 2011-01-05 NOTE — Consult Note (Signed)
NAME:  [name], [name]                  ACCOUNT NO.:  [id number]   MEDICAL RECORD NO.:  [id number]                   PATIENT TYPE:  INP   LOCATION:  4729                                 FACILITY:  [facility]   PHYSICIAN:  Doylene Canning. Ladona Ridgel, M.D.               DATE OF BIRTH:  [birth year]   DATE OF CONSULTATION:  01/20/2004  DATE OF DISCHARGE:                                   CONSULTATION   CONSULTING PHYSICIAN:  Doylene Canning. Ladona Ridgel, M.D.   REQUESTING PHYSICIAN:  Learta Codding, M.D. [facility].   INDICATIONS FOR CONSULTATION:  Evaluation of nonsustained VT in a patient  with sustained tachy palpitations and possible sarcoidosis.   HISTORY OF PRESENT ILLNESS:  The patient is a very pleasant middle-aged  woman with a history of hypertension, and dyslipidemia, and a prior  intracranial aneurism clipping in 2002.  She was admitted to the hospital  after experiencing a sustained episode of tachy palpitations.  The  paramedics were called, and she was said to have a heart rate in the 170  range; however, despite an exhaustive effort to obtain the  electrocardiographic strips of this arrhythmia they were not available.  By  the time she came to the emergency room, her heart rate was back to normal.  She has been admitted for evaluation.  Subsequent evaluation demonstrated a  CT scan of the chest which demonstrated bilateral adenopathy and a small  pulmonary emboli.  The patient has undergone transesophageal echo looking  for right to left shunting which demonstrated a PFO with left to right  shunting but not right to left shunting.  Her ejection fraction, by echo at  that time, was 35%.  She has undergone catheterization which demonstrated  normal coronary arteries, with mild LV dysfunction, with an EF of 50%, with  anterior anterolateral and inferior hypo/akinesis.  These were clearly  segmental wall motion abnormalities.  The patient states that she has never  had persistent tachy palpitations like  those that brought her to the  hospital.  She does have rare palpitations and has never had any syncope.   PAST MEDICAL HISTORY:  As previously noted.   FAMILY HISTORY:  Very remarkable for a sister and father who died suddenly  as young adults, and a brother who died of cardiomyopathy in his mid-50s.   SOCIAL HISTORY:  The patient is retired from [employer].  She quit smoking  cigarettes 15 years ago and rarely drinks alcohol.  Her mother is still  living and has diabetes.   REVIEW OF SYSTEMS:  Notable for chest pain and shortness of breath  associated with her palpitations.  She denies claudication, cough, or  hemoptysis.  She denies joint swelling or deformity.  She denies any  dysuria, hematuria, or nocturia.  She does have some mild weakness and  anxiety.  She denies nausea, vomiting, diarrhea, or constipation, polyuria,  or polydipsia.  The rest of her review of systems is  negative.   PHYSICAL EXAMINATION:  GENERAL:  She is a pleasant, well-appearing, middle-  aged woman in no distress.  VITAL SIGNS:  Blood pressure 130/78, pulse 68 and regular, respirations were  18, temperature was 97.5, weight was 209 pounds.  HEENT:  Normocephalic and atraumatic.  Pupils equal and round.  The  oropharynx was moist.  The sclerae were anicteric.  NECK:  Revealed no jugular venous distention.  There was no thyromegaly.  The trachea was midline.  The carotids were 2+ and symmetric.  LUNGS:  Clear bilaterally to auscultation.  I did not appreciate any  wheezes, rales, or rhonchi.  CARDIOVASCULAR:  Revealed a regular rate and rhythm with normal S1 and S2.  There were no murmurs, rubs or gallops present.  ABDOMEN:  Soft, nontender, nondistended.  There was no organomegaly.  EXTREMITIES:  Demonstrated no cyanosis, clubbing, or edema.  The pulses were  2+ and symmetric.  NEUROLOGIC:  Nonfocal.   EKG demonstrates sinus rhythm with normal axis and intervals.  There were no  A-V conduction  delays.   IMPRESSION:  1. Nonsustained ventricular tachycardia.  2. Unknown cardiomyopathy with variable ejection fraction but clear focal     segmental wall motion abnormalities.  3. Small pulmonary embolism.  4. Hypertension.  5. Dyslipidemia.   DISCUSSION:  The etiology of the patient's wall motion abnormalities are  unclear.  She certainly has some adenopathy which could be suggestive of  sarcoidosis and cardiac sarcoid is certainly a possibility.  She, however,  does not have any evidence of conduction system disease, that is no first  degree heart block or bundle branch block which is often seen in patients  with sarcoidosis.  Her serum __________  level, however, was also not  elevated, again going against the diagnosis of sarcoid.  Regardless with her  history of near syncope with nonsustained VT and sustained tachy  palpitations for which we unfortunately do not have documentation of, we  will plan to proceed with electrophysiologic testing and consider ICD  insertion based on the results of the above testing.                                               Doylene Canning. Ladona Ridgel, M.D.    GWT/MEDQ  D:  01/20/2004  T:  01/21/2004  Job:  [id number]   cc:   Marne A. Milinda Antis, M.D. [facility]

## 2011-01-05 NOTE — Op Note (Signed)
NAME:  ELLSIE, VIOLETTE                  ACCOUNT NO.:  [id number]   MEDICAL RECORD NO.:  [id number]                   PATIENT TYPE:  OUT   LOCATION:  XRAY                                 FACILITY:  [facility]   PHYSICIAN:  Duke Salvia, M.D.               DATE OF BIRTH:  [birth year]   DATE OF PROCEDURE:  01/21/2004  DATE OF DISCHARGE:  02/22/2004                                 OPERATIVE REPORT   PREOPERATIVE DIAGNOSIS:  Presumably syncope.   POSTOPERATIVE DIAGNOSIS:  Negative electrophysiologic study.   PROCEDURE PERFORMED:  Invasive electrophysiologic study.   DESCRIPTION OF PROCEDURE:  Following the obtaining of informed consent, the  patient was brought to the electrophysiology laboratory and placed on the  fluoroscopic table in supine position.  After routine prep and drape,  cardiac catheterization was performed with local anesthesia and conscious  sedation.  Noninvasive blood pressure monitoring and transcutaneous oxygen  saturation monitoring and end tidal CO2 monitoring were performed  continuously throughout the procedure.  Following the procedure, the  catheters were removed.  Hemostasis was obtained and the patient was then  transferred to the floor in stable condition.   CATHETERS:  A 5 French quadripolar catheter was inserted via the left  femoral vein to the AV node to measure the His bundle electrogram.  A 5 French quadripolar catheter was inserted via the left femoral vein to  the right ventricular apex, subsequently moved to the right ventricular  outflow tract and then to the high right atrium.   Surface leads 1, aVF and V1 were monitored continuously throughout the  procedure.  Following insertion of the catheters the stimulation protocol  included incremental pacing, incremental ventricular pacing, single atrial  extrastimuli at a pace cycle length of 600 msec, single double and triple  ventricular extrastimuli from the right ventricular apex on the right  ventricular outflow tract at pace cycle lengths of   Actually, just abort this dictation please.                                               Duke Salvia, M.D.    SCK/MEDQ  D:  02/24/2004  T:  02/25/2004  Job:  [id number]

## 2011-01-05 NOTE — Discharge Summary (Signed)
NAME:  [name], [name]                  ACCOUNT NO.:  [id number]   MEDICAL RECORD NO.:  [id number]                   PATIENT TYPE:  INP   LOCATION:  3027                                 FACILITY:  [facility]   PHYSICIAN:  Pramod P. Pearlean Brownie, MD                 DATE OF BIRTH:  March 23, 1951   DATE OF ADMISSION:  11/20/2003  DATE OF DISCHARGE:  11/22/2003                                 DISCHARGE SUMMARY   DIAGNOSES AT TIME OF DISCHARGE:  1. Left brain transient ischemic attack secondary to either small vessel or     complicated migraine.  2. Dyslipidemia.  3. Hypertension.  4. History of migraine.  5. History of cerebral clip in 2002.   MEDICATIONS AT THE TIME OF DISCHARGE:  1. Aggrenox one twice a day.  2. Altace 20 mg a day.  3. Protonix 40 mg a day.  4. Xalatan 0.005% one drop to each eye at bedtime.   STUDIES PERFORMED:  1. CT of head on admission showed an aneurysm clip in the right anterior     parasellar region with mild encephalomalacia changes in the right     temporal lobe mainly of the anterior aspect.  No acute intracranial     abnormality.  2. Chest x-ray shows cardiac size in upper limits of normal to light     enlarged, probable chronic bronchitic changes, cannot rule out active     infiltrates at medial bases, and suspicion for hilar adenopathy/masses.     Sarcoidosis would be a consideration.  3. MRI of the brain shows status post surgical clipping in the right     ophthalmic artery.  No large vessel occlusion or aneurysm seen.     Recurrent aneurysm if sites occluded would not be accurately evaluation.     Bilateral carotid bifurcations widely patent.  4. EKG with normal sinus rhythm.  No significant change since last tracing.  5. 2-D echocardiogram with EF 55-65% with no LV regional wall motion     abnormalities and no cardiac embolic source.  6. Transcranial Doppler shows normal mean flow velocities in anterior and     posterior cerebral circulation.  7.  Carotid Doppler is normal.   LABORATORY STUDIES:  Hemoglobin 13.6, hematocrit 40.  Coagulation studies  normal.  Sodium 141, potassium 3.8, chloride 106, glucose 106, BUN 25,  creatinine 1.  Homocystine normal.  Lipids of cholesterol 189, triglycerides  71, HDL 46, and LDL 129.   HISTORY OF PRESENT ILLNESS:  [name] is a 60 year old female with  a history of hypertension and cerebral aneurysm, who was feeling well today  when she suddenly developed severe headache behind her right eye.  Later  that evening, she was talking on the phone and noticed sudden onset of  thick tongue with numbness in the right side of her face and clumsiness  and numbness of her right arm.  She reports the symptoms lasted  approximately 36 seconds  and improved, although per report of the EDP, she  was still a little bit dysarthric upon arrival.  EMS brought her to the  [facility] Emergency Room where overall she felt much better.  She will be  admitted for further stroke evaluation.  She is not a SAINT II or TPA  candidate secondary to quick resolution of symptoms.   HOSPITAL COURSE:  MRI was unrevealing for an acute event.  There was some  question of aneurysm at the site of old aneurysm and a __________ was  attempted, though contrast extravasated in the right arm and the procedure  was noted completed.  Routine stroke workup was done.  Risk factors were  identified and included LDL.  The patient was recommended to be on a statin,  however, the patient does have a history of increased LFTs and we will hold  off on a statin for opinion from primary physician.  Goal LDL would be less  than 100.  Will go ahead and change the patient from aspirin to Aggrenox for  secondary stroke prevention and follow her up in about two months.   CONDITION ON DISCHARGE:  The patient was alert and oriented x 3.  No  aphasia.  She has subjective diplopia upon looking up and towards her dose.  She has no hypotrophic  visual field loss.  She has no focal extremity  weakness.  She has no numbness.   DISCHARGE PLAN:  1. Discharged home  2. Aggrenox for secondary stroke prevention.  Goal LDL less than 100.     Recommend status.  Will defer to primary care for that decision.  3. Follow up with Dr. Pearlean Brownie in two months.  The patient is to call for an     appointment.      [name], N.P.                         Pramod P. Pearlean Brownie, MD    SB/MEDQ  D:  12/10/2003  T:  12/12/2003  Job:  [id number]   cc:   Marne A. Milinda Antis, M.D. [facility]

## 2011-01-05 NOTE — Op Note (Signed)
   NAME:  [name], [name]                  ACCOUNT NO.:  [id number]   MEDICAL RECORD NO.:  [id number]                   PATIENT TYPE:  INP   LOCATION:  0354                                 [facility]ACILITY:  [facility]   PHYSICIAN:  James L. Malon Kindle., M.D.          DATE OF BIRTH:  08-Sep-1950   DATE OF PROCEDURE:  03/31/2003  DATE OF DISCHARGE:                                 OPERATIVE REPORT   PROCEDURE:  Colonoscopy.   MEDICATIONS:  1. Fentanyl 125 mcg.  2. Versed 12 mg IV.   INDICATION:  Acute GI bleed with minimal findings on upper endoscopy.   DESCRIPTION OF PROCEDURE:  The procedure had been explained to the patient  and consent obtained.  The patient in the left lateral decubitus position,  the Olympus pediatric colonoscope was inserted and advanced under direct  visualization.  The prep was excellent.  The patient had extensive  diverticular disease of the sigmoid.  After we got through this, we were  able to advance over to the right colon.  We got down to just at the top of  the ileocecal valve.  Cecum seen in the distance.  No bleeding.  Pandiverticulosis throughout the entire colon.  There were no signs of  active or recent bleeding.  The scope was withdrawn, and multiple large-  mouth diverticula found throughout the colon, particularly prominent in the  left colon, the descending, and sigmoid.  No polyps seen.  The rectum was  free of polyps or other lesions.  The scope was withdrawn.  The patient  tolerated the procedure well.   ASSESSMENT:  Pandiverticulosis, probably the source of her bleeding.   PLAN:  She will follow up in one month in the office and was sent home with  diverticulitis information.                                               James L. Malon Kindle., M.D.    Waldron Session  D:  03/31/2003  T:  03/31/2003  Job:  [id number]   cc:   Marne A. Milinda Antis, M.D. [facility]

## 2011-01-05 NOTE — Consult Note (Signed)
NAME:  [name], [name]                  ACCOUNT NO.:  [id number]   MEDICAL RECORD NO.:  [id number]                   PATIENT TYPE:  INP   LOCATION:  4729                                 FACILITY:  [facility]   PHYSICIAN:  Marcelyn Bruins, M.D. [facility]              DATE OF BIRTH:  04/06/1951   DATE OF CONSULTATION:  01/18/2004  DATE OF DISCHARGE:                                   CONSULTATION   PULMONARY CONSULTATION:   HISTORY OF PRESENT ILLNESS:  The patient is a very-pleasant 60 year old  black female who I have been asked to see for mediastinal and hilar  lymphadenopathy.  The patient was admitted with atypical chest pain,  palpitations, and ultimately found to have elevated cardiac enzymes, but  nonobstructive coronary artery disease at catheterized.  The patient  underwent a CT scan of the chest which showed a questionable small PE in the  right lower lobe, pulmonary artery.  She was also found to have mild-to-  moderate mediastinal and hilar lymphadenopathy, and a few small areas of  bronchiectasis.  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  Her EKG has been  without first-degree AV block.  Prior to all of this, the patient was in  fairly good health and was asymptomatic from a pulmonary standpoint.  She  did water aerobics.  She had no cough or mucus production, and no  unexplained weight loss.  She also had minimal musculoskeletal complaints,  and no fevers, chills or sweats.   PAST MEDICAL HISTORY:  1. Significant for a intracranial aneurysm that was clipped in 2002.  2. History of GI bleeding in 2004.  3. History of glaucoma.  4. History of __________ulcer.   ALLERGIES:  The patient has an allergy to Dilantin which causes a rash.   SOCIAL HISTORY:  She lives in [place].  She is retired from [employer].  She does have a history of tobacco use, but quit approximately 15 years ago.   FAMILY HISTORY:   Remarkable for her mother being diabetic.  Her father died  from cardiomyopathy in her 34's.  Her brother and sister also died from  cardiomyopathy.  There is no family history of sarcoidosis.   REVIEW OF SYSTEMS:  As per history of present illness.  See nurse notation  in the chart.   PHYSICAL EXAMINATION:  GENERAL:  She is a very well-developed black female,  in no acute distress.  VITAL SIGNS:  Blood pressure was 105/70, pulse 82, respiratory rate 16.  She  was afebrile.  HEENT:  Pupils equal, round and reactive to light and accommodation.  Extraocular muscles are intact.  Nares are patent without discharge.  Oropharynx is clear.  NECK:  Supple without JVD or lymphadenopathy.  There is no palpable  thyromegaly.  CHEST:  Reveals faint right basilar crackles.  CARDIAC:  Reveals regular rate and rhythm with no murmurs, rubs  or gallops.  ABDOMEN:  Soft, nontender with good bowel sounds.  There is no organomegaly.  GENITAL EXAM/ RECTAL EXAM/ BREAST EXAM:  Not done and not indicated.  EXTREMITIES:  Lower extremities are without edema. Pulses are intact  distally.  NEUROLOGIC:  She is alert and oriented with no obvious motor deficits.   CT scan as per history of present illness.   IMPRESSION:  Mild-to-moderate mediastinal and hilar lymphadenopathy of  unknown etiology.  The differential diagnoses includes reactive  lymphadenopathy, sarcoidosis or possibly lymphoma.  The small size and  limited number, as well as the lack of constitutional symptoms make lymphoma  very unlikely, but certainly is not excluded 100%.  The patient may have  sarcoidosis, but would not be treated with steroids from a pulmonary  perspective at this point in time.  I think cardiac sarcoid is very  unlikely, and I have never seen a case without first-degree AV block being  present; however, I am sure it can occur on occasion.  Her anticoagulation  will certainly be problematic for making a tissue diagnosis.  I  have  discussed the various ways to diagnose sarcoid including fiberoptic  bronchoscopy with biopsy as well as mediastinoscopy.  I have been over the  various diagnostic yields with the patient as well as the risk and benefits.  Really, it is her choice.   SUGGESTIONS:  1. I would not intervene at this point in time and have her finish up her     course of anticoagulation.  2. Followup CT scan in three months.  If there is no lymphadenopathy, then     she needs no further workup.  If she has persistent lymphadenopathy, then     she will need mediastinoscopy versus fiberoptic bronchoscopy for tissue     diagnosis.  3. If the clinical situation changes over the next three months, we may need     to reassess the above algorithm.                                               Marcelyn Bruins, M.D. [facility]    KC/MEDQ  D:  01/18/2004  T:  01/18/2004  Job:  [id number]   cc:   Learta Codding, M.D. [facility] A. Milinda Antis, M.D. [facility]

## 2011-01-05 NOTE — H&P (Signed)
NAME:  [name]                  ACCOUNT NO.:  [id number]   MEDICAL RECORD NO.:  [id number]                   PATIENT TYPE:  INP   LOCATION:  0160                                 FACILITY:  [facility]   PHYSICIAN:  Marne A. Milinda Antis, M.D. [facility]            DATE OF BIRTH:  21-Mar-1951   DATE OF ADMISSION:  03/29/2003  DATE OF DISCHARGE:                                HISTORY & PHYSICAL   [name] is a 60 year old Afro-American female admitted with  rectal bleeding.   The onset was at approximately 9:30 p.m. on March 30, 2003, manifested as a  sensation of having passed gas followed by the production of bright red  rectal bleeding.  A colonoscopy in 2000 by Dr. Carman Ching was apparently  negative.   She produced over three cups of bright red blood over the past several  hours.  Despite this, she has been hemodynamically stable without postural  pulse or blood pressure changes, and with an hematocrit of 37.2.   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  She says she has had hypertension and arthritis.  She has been followed by  Dr. Corliss Skains, Hematologist.  Because of the elevated liver function tests,  Dr. Corliss Skains referred her to Dr. Ritta Slot for a liver function study.  Apparently, the liver functions were attributed to a fatty liver  infiltration.   She has had arthroscopies bilaterally.  She has had a total abdominal  hysterectomy for hemorrhage; she has had no abnormal Pap smears.  She is  gravida 4, para 2.   ALLERGIES:  She is allergic to DILANTIN which caused urticaria.   MEDICATIONS:  1. Altace 200 mg daily.  2. Vioxx 12.5 mg twice a day as needed until recently.  3. Hydrocodone has been prescribed by Dr. Madelon Lips for osteoarthritis changes     in her knees.  Dr. Madelon Lips has recommended arthroscopy which the patient     is considering.  4. She also takes 325 mg of aspirin daily for anticoagulation because of   __________.   SOCIAL HISTORY:  She drinks social.  She does not smoke.  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   FAMILY HISTORY:  Negative for GI disease.  Cardiomyopathy is found in a son,  father, sister, and brothers.  The etiology of the cardiomyopathy is  unknown.  One brother had a stroke.  Mother had diabetes as did a brother.   REVIEW OF SYSTEMS:  1. Negative for bleeding dyscrasias.  2. She has had palpitations for several weeks.  3. She also has the diagnosis of fibromyalgia.   PHYSICAL EXAMINATION:  GENERAL:  She is in no acute distress.  VITAL SIGNS:  At this time, blood pressure is 156/79, pulse of 92.  Respiratory rate 18.  O2 sats were 99% on room air.  As stated, there was no  postural change in  pulse or blood pressure.  HEENT:  Arteriolar narrowing is noted on fundal exam.  Otolaryngological is  unremarkable.  Dental hygiene is good to excellent.  NECK:  She has an operative scar over the right neck.  Thyroid is normal to  palpation.  She has no carotid bruits.  CHEST:  Minor rales at the bases.  HEART:  An S4 was noted without tachycardia.  ABDOMEN:  Nontender.  Striae are present over the abdomen.  EXTREMITIES:  Pedal pulses are intact with no edema.  She has nevi over the  shins.   Hematocrit is 37.2.  PT, INR, and PTT are normal.  A glucose is 134.   She is now admitted for GI bleed.  With history of palpitations, she will be  placed on telemetry.  Serial hematocrits will be checked.  GI consultation  will be pursued.  Again, it would be important to verify that the  colonoscopy in 2002 by Dr. Carman Ching did not show any pathology; the  most likely etiology would be diverticulosis.  The possibility of a General  Surgery consultation, and this has been discussed with [name].     Titus Dubin. Alwyn Ren, M.D. [facility]               Marne A. Milinda Antis, M.D. [facility]    WFH/MEDQ  D:  03/30/2003  T:  03/30/2003  Job:   [id number]   cc:   Fayrene Fearing L. Malon Kindle., M.D.  [address]  [place]  [place] [address]  Fax: [phone]   Thera Flake., M.D.  [address] [place]  [place] [address]  Fax: [phone]   Pollyann Savoy, M.D.  [address]  [place], [place] [address]  Fax: [phone]   Marne A. Milinda Antis, M.D. [facility]

## 2011-01-05 NOTE — Op Note (Signed)
NAME:  [name], [name]                  ACCOUNT NO.:  [id number]   MEDICAL RECORD NO.:  [id number]                   PATIENT TYPE:  INP   LOCATION:  4729                                 FACILITY:  [facility]   PHYSICIAN:  Charlton Haws, M.D.                  DATE OF BIRTH:  Nov 24, 1950   DATE OF PROCEDURE:  DATE OF DISCHARGE:                                 OPERATIVE REPORT   PROCEDURE:  Transesophageal echocardiogram.   INDICATIONS:  Previous stroke with cardiomyopathy, question PFO.   The patient was sedated with 8 mg of Versed and 50 mg of fentanyl.   Using digital technique, an OmniPlane probe was advanced into the distal  esophagus without incident.  Transgastric imaging revealed mild LVH with  mild to moderate left ventricular cavity enlargement.  There was global  hypokinesis with an EF in the 35% range.  There was mild mitral  insufficiency.  The right-sided cardiac chambers were normal.   The atrial septum was well-interrogated in orthogonal views.  There was a  moderate-sized PFO with left-to-right shunting.  Bubble study with coughing  showed no evidence of right-to-left shunting.   The aortic valve was trileaflet, and the aortic root was normal.  There was  no left atrial appendage thrombus and no aortic debris.   FINAL IMPRESSION:  1. Ejection fraction 35%, mild to moderate left ventricular cavity     enlargement, with global hypokinesis.  2. Moderate-sized patent foramen ovale with left-to-right shunting and     negative bubble study.  3. Mild mitral regurgitation.  4. Normal aortic valve.  5. Normal left atrial appendage thrombus.  6. No aortic debris.                                               Charlton Haws, M.D.    PN/MEDQ  D:  01/18/2004  T:  01/18/2004  Job:  [id number]   cc:   Learta Codding, M.D. [facility]   Echocardiography Lab

## 2011-01-05 NOTE — Op Note (Signed)
   NAME:  [name], HANNEN                  ACCOUNT NO.:  [id number]   MEDICAL RECORD NO.:  [id number]                   PATIENT TYPE:  INP   LOCATION:  0354                                 FACILITY:  [facility]   PHYSICIAN:  James L. Malon Kindle., M.D.          DATE OF BIRTH:  23-Jul-1951   DATE OF PROCEDURE:  03/30/2003  DATE OF DISCHARGE:                                 OPERATIVE REPORT   PROCEDURE:  Esophagogastroduodenoscopy and biopsy.   MEDICATIONS:  1. Cetacaine spray.  2. Fentanyl 50 mcg.  3. Versed 4 mg IV.   INDICATION:  GI bleed.   DESCRIPTION OF PROCEDURE:  The procedure had been explained to the patient  and consent obtained.  With the patient in the left lateral decubitus  position, the Olympus scope was inserted and advanced into the stomach.  There was no signs of active bleeding.  Pylorus was identified and passed.  The duodenum, including the bulb and second portion, were seen well and was  entirely unremarkable.  The scope was withdrawn back into the stomach and,  in the immediate prepyloric area, there was a very small, 2-3 mm, shallow  gastric ulcer that appeared to be healing.  In the more proximal antrum, was  another shallow gastric ulcer that was smooth and appeared to be healing.  Each of these was 2-3 mm in diameter and shallow with no signs whatsoever of  active or recent bleeding.  Biopsy was taken to test for Helicobacter.  The  fundus and cardia were seen well on the retroflexed view and were normal.  The scope was withdrawn.  The distal and proximal esophagus were normal.  The patient tolerated the procedure well, was maintained on low-flow oxygen  and pulse oximeter throughout the procedure.   ASSESSMENT:  Very small gastric ulcer.  I doubt this is the source of  bleeding.   PLAN:  We will treat with oral Protonix, stop the Vioxx, and plan on a  colonoscopy tomorrow.                                               James L. Malon Kindle.,  M.D.    Waldron Session  D:  03/30/2003  T:  03/30/2003  Job:  [id number]   cc:   Marne A. Milinda Antis, M.D. [facility]

## 2011-01-05 NOTE — H&P (Signed)
NAME:  [name], [name]                  ACCOUNT NO.:  [id number]   MEDICAL RECORD NO.:  [id number]                   PATIENT TYPE:  INP   LOCATION:  1823                                 FACILITY:  [facility]   PHYSICIAN:  Learta Codding, M.D. [facility]             DATE OF BIRTH:  [birth year]   DATE OF ADMISSION:  01/13/2004  DATE OF DISCHARGE:                                HISTORY & PHYSICAL   REFERRING PHYSICIAN:  Marne A. Milinda Antis, M.D. [facility].   CARDIOLOGIST:  New-Dr. Andee Lineman.   CURRENT COMPLAINT:  Palpitations and lightheadedness associated with chest  pressure.   HISTORY OF PRESENT ILLNESS:  The patient is a 60 year old African-American  female with past medical history of hypertension, dyslipidemia, and TIA as  well as intracranial aneurysmal clipping in 2002. The patient as stated  earlier today started having palpitations associated with lightheadedness  and generalized weakness. She states the episodes occurred off and on,  usually last for a few minutes, would get better and than start up again.  She was dizzy when this occurred. She also felt somewhat clammy and sweating  associated with chest pressure as well as some pain radiating to the neck  and into the left arm. She did receive nitroglycerin via EMS and did not  resolve her chest discomfort. She did become lightheaded with nitroglycerin.  The patient has a history of familial cardiomyopathy. There is no history  sudden cardiac death; however, she had a prior evaluation for this at  [facility] with an echocardiogram and Cardiolite study. Currently,  the patient is still experiencing some slight chest pressure. There are no  acute ischemic changes. We are trying to secure the rhythm strips which  document apparently a rapid arrhythmia via EMS.   ALLERGIES:  __________  causes skin rash, LIPITOR causes myalgias.   MEDICATIONS:  1. Crestor 10 mg a day.  2. Prevacid 30 mg a day.  3. Neurontin 100 mg p.o. q.d.  4.  Altace 20 mg p.o. q.d.  5. Ambien 10 q.h.s.  6. Aggrenox one tablet p.o. b.i.d.  7. Xalatan eye drops.   PAST MEDICAL HISTORY:  History of adenosine Cardiolite and echocardiogram.  Echocardiogram demonstrated on November 22, 2003 normal LV function, EF 55 to  65%. Status post TAH. History of chronic diplopia, history of glaucoma,  history of gastric ulcer and GI bleeding in August of 2004. Intracranial  aneurysm clipping in 2002.   SOCIAL HISTORY:  The patient lives in [place] by herself. 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 She quit smoking 15 years ago. She drinks alcohol on  occasion. She likes to do water aerobics.   REVIEW OF SYSTEMS:  Positive for chills and sweats. Positive for chest pain,  shortness of breath and orthopnea, palpitations, and presyncope as outlined  above. Positive for weakness and anxiety. No myalgias or arthralgias. No  nausea or vomiting.   PHYSICAL EXAMINATION:  VITAL SIGNS:  Blood pressure 125/67, heart  rate 63  beats per minute, respirations 22, temperature 98.7.  GENERAL:  African-American female in no apparent distress.  HEENT:  [place]/AT. PERRLA. Sclerae are clear.  NECK:  Supple with right sided carotid bruit.  HEART:  Regular rate and rhythm. Normal S1 and S2.  LUNGS:  Clear breath sounds bilaterally.  ABDOMEN:  Soft, nontender. No rebound. No guarding. Good bowel sounds.  GENITAL/RECTAL:  Deferred.  EXTREMITIES:  No clubbing, cyanosis, or edema.  NEUROLOGICAL:  The patient is alert and oriented. Grossly nonfocal.   STUDIES:  Chest x-ray:  No cardiomegaly, no evidence of CHF. EKG:  Normal  sinus rhythm, repolarization, probably normal variant.   Labs:  BUN 12, creatinine 0.8, sodium 139, potassium 3.7. Cardiac troponin  0.952, 0.15, and 3.46 respectively. The CK-MB were within normal limits. BNP  33.9.   IMPRESSION AND PLAN:  1. Palpitations. The patient has had palpitations. She also has documented     arrhythmia through the EMS services. We  are trying to get these rhythm     strips. I suspect this may have been some type of supraventricular     arrhythmia that the patient experienced. She does have occasional PVCs on     her telemetry pacing that could have initiated this. I have spoken with     the EMS services, and they will get the rhythm strips from central base.     In the meanwhile, we will start the patient on beta blocker therapy, give     her a dose of Lopressor. She may need an EP evaluation also.  2. Substernal chest pressure. The patient experienced chest pressure in the     setting of her arrhythmia and still has slight ongoing chest pressure.     Her troponins are very concerning for underlying coronary artery disease     and ischemia/injury. We also plan cardiac catheterization on this     patient. Discussed risk and benefit of this procedure with the patient,     and she is willing to proceed.  3. Carotid vascular disease. The patient has right-sided carotid bruit.     Carotid Dopplers will be ordered.   DISPOSITION:  Plan is to obtain rhythm strips from EMS. In the interim, the  patient will get a cardiac catheterization. Also a D-dimer will be obtained,  and the patient will be started on heparin, and further workup may be  indicated if positive D-dimer.                                                Learta Codding, M.D. [facility]    GED/MEDQ  D:  01/14/2004  T:  01/14/2004  Job:  [id number]   cc:   Marne A. Milinda Antis, M.D. [facility]

## 2011-01-05 NOTE — H&P (Signed)
NAME:  [name], [name]                  ACCOUNT NO.:  [id number]   MEDICAL RECORD NO.:  [id number]                   PATIENT TYPE:  INP   LOCATION:  1824                                 FACILITY:  [facility]   PHYSICIAN:  Michael L. Thad Ranger, M.D.           DATE OF BIRTH:  1950-12-28   DATE OF ADMISSION:  11/20/2003  DATE OF DISCHARGE:                                HISTORY & PHYSICAL   REASON FOR ADMISSION:  Right-sided weakness, slurred speech, suspected TIA.   HISTORY OF PRESENT ILLNESS:  This is the initial stroke service admission  and one of several [facility] admissions for this 60-year-  old woman with past medical history which includes hypertension and a  history of an aneurysm clipping. The patient was feeling well today when  this evening she developed severe steady headache behind the right eye which  was not throbbing in nature and not associated with nausea, watering of the  eyes, or visual changes. It was then about 9 or 9:30 p.m. when she was on  phone she noticed the sudden onset of thick tongue along with numbness in  the right side of the face and clumsiness and numbness of the right arm.  According to her report, symptoms lasted 36 seconds and then improved  although per report of the ER attending she was still a little bit  dysarthric upon arrival here. She was alerted EMS and was brought to [facility] emergency room where her symptoms improved, and she presently feels  good. She denies any loss of consciousness or any alteration of  consciousness, and there has been no history of previous similar event.   PAST MEDICAL HISTORY:  She underwent aneurysm clipping on the right side in  2002 by Dr. Channing Mutters. She has had some headaches every since but not too severe.  She has been told by Dr. Sandria Manly that there is low flow in some of the blood  vessels in that area. She has also history of hypertension which is under  fairly good control as well as  diverticulosis with GI bleed in August of  last year as well as glaucoma.   FAMILY HISTORY:  Remarkable for cardiomyopathy.   SOCIAL HISTORY:  She occasionally uses alcohol. She does not smoke. She is  independent in her activities of daily living. She is not presently working.   ALLERGIES:  DILANTIN.   MEDICATIONS:  1. Aspirin q.d.  2. Altace 20 mg q.d.  3. Prevacid.  4. Xalatan drops.   REVIEW OF SYSTEMS:  She has had some intermittent headaches as noted above.  She had change in the vision in her right eye a few days ago, not a loss of  vision but a blurring which has largely cleared although __________ of being  quite normal. She has had occasional palpitations over the last one to two  weeks. __________ review of systems are otherwise negative except as per the  HPI and the  admission nursing record.   PHYSICAL EXAMINATION:  VITAL SIGNS:  Temperature 97.3, blood pressure  133/73, pulse 71, respirations 16.  GENERAL:  This is a healthy appearing female lying supine in the hospital  bed in no evident distress.  HEENT:  Head:  Cranium is normocephalic, atraumatic. Oropharynx is benign.  NECK:  Supple without carotid bruits.  CHEST:  Clear to auscultation bilaterally.  HEART:  Regular rate and rhythm without murmurs.  ABDOMEN:  Obese, soft, normal active bowel sounds, no hepatosplenomegaly.  EXTREMITIES:  2+ pulses, no edema.  NEUROLOGICAL:  Mental status:  She is awake, alert, and fully oriented.  Speech is fluent and not dysarthric. Mood is euthymic and affect  appropriate. Cranial nerves:  Funduscopic exam was benign. Pupils equal and  briskly reactive. Extraocular movements are full without nystagmus. Visual  fields are full to confrontation. Face, tongue, and palate move normally and  symmetrically. Facial sensation intact to pinprick. Motor:  Normal bulk and  tone. Normal strength. __________ muscle sensation. There is inconsistent  decreased pinprick on the right hand  and forearm compared to the left;  otherwise intact to pinprick and light touch throughout. Coordination and  rapid movements were performed well. Finger to nose and heel to shin were  performed well. Gait:  She arises from the bed easily, and her stance is  normal. She can ambulate without difficulty. Reflexes 2+ symmetric. Tongues  are downgoing.   LABORATORY DATA:  Chemistries in the ER are unremarkable except for a mildly  elevated glucose of 106. Hematocrit is 40. EKG is normal. CT of the head is  personally reviewed and is normal except for the old aneurysm clip.   IMPRESSION:  1. Transient dysarthria and right hemiparesis; most likely etiology is     transient ischemic attack.  2. History of aneurysm clipping.  3. History of hypertension.   PLAN:  We will admit for observation. We will check followup CT in 24 to 36  hours as per her report. She did not have a MRI due to her aneurysm clip. We  will also check Dopplers, echocardiogram, telemetry, and stroke labs. Stroke  service to follow.                                                Michael L. Thad Ranger, M.D.    MLR/MEDQ  D:  11/20/2003  T:  11/22/2003  Job:  [id number]

## 2011-01-05 NOTE — Op Note (Signed)
NAME:  [name], [name]                  ACCOUNT NO.:  [id number]   MEDICAL RECORD NO.:  [id number]                   PATIENT TYPE:  OUT   LOCATION:  XRAY                                 FACILITY:  [facility]   PHYSICIAN:  Duke Salvia, M.D.               DATE OF BIRTH:  1950-09-07   DATE OF PROCEDURE:  01/21/2004  DATE OF DISCHARGE:  02/22/2004                                 OPERATIVE REPORT   PREOPERATIVE DIAGNOSIS:  Nonischemic cardiomyopathy and ventricular  tachycardia.   POSTOPERATIVE DIAGNOSIS:  Nonischemic cardiomyopathy and ventricular  tachycardia.   PROCEDURE PERFORMED:  Invasive electrophysiologic study.   I should note the patient's clinical history is not in front of me at the  time of this dictation.   SURGEON:  Duke Salvia, M.D.   DESCRIPTION OF PROCEDURE:  Cardiac catheterization was performed with local  anesthesia and conscious sedation.  Noninvasive blood pressure monitoring,  transcutaneous oxygen saturation monitoring were performed throughout the  procedure.  Following the procedure, the catheters were removed.  Hemostasis  was obtained and the patient was transferred to the floor in stable  condition.   CATHETERS:  A 5 French quadripolar catheter was inserted via the left  femoral vein to the AV junction to measure the His electrogram.  A 5 French quadripolar catheter was inserted via the left femoral vein to  the RV apex, subsequently removed to the right ventricular outflow tract and  then to the high right atrium.  Surface leads 1, aVF and V1 were monitored continuously throughout the  procedure.   Following insertion of the catheters, the stimulation protocol included  incremental atrial pacing.  Incremental ventricular pacing.  Single atrial stimuli at a pace cycle length of 600 msec.  Single and double ventricular extrastimuli from the right ventricular  outflow tract and right ventricular apex at pace cycle lengths of 500 and  350  msec.   RESULTS:  1. Surface electrocardiogram.  Rhythm is sinus.  Cycle length is 1018 msec.     P-R interval is 199 msec. QRS duration is 88 msec.  QT interval is 415     msec.  P-wave duration is 143 msec.  Intracardiac measurements are not     available at the present time.  2. AV nodal function.  AV Wenckebach was 450 msec.  VA Wenckebach was 350     msec.  AV nodal effective refractory period at pace cycle length of 600     msec was 380 msec.  3. Ventricular response to programmed stimulation. The effective refractory     period at the right ventricular apex at a pace cycle length of 550 msec     was 230 msec and from the right ventricular outflow tract at a pace cycle     length of 500 msec was 260 msec and at 350 msec was 130 msec.   The closest coupling intervals recorded from the right ventricular apex at  350 msec were 210:180 and with long shorts it was 350-550-230.  The closest  coupling intervals from the right ventricular outflow tract at a pace cycle  length of 550 msec was 260:210 and at 350 msec was 230:200.  I suspect that  S4s were done but I don't have those data here.  1. Arrhythmias induced.  None.  No inducible arrhythmias were seen and the     clinical course that was recommended is not available to me at the time     of dictation.  It will be in the patient's medical record.   Following the obtaining of informed consent, the patient was brought to the  electrophysiology laboratory and placed on the fluoroscopic table in supine  position.  After transcutaneous mapping identifying a relatively caudal  site, lidocaine was infiltrated 3 to 4 cm caudal to the clavicle and 1.5 cm  lateral to the sternum.  An incision was made and carried down to the layer  of the prepectoral fascia using electrocautery and sharp dissection.  A  pocket was formed similarly, hemostasis was obtained.   Thereafter, a suture was placed on the superolateral aspect of the pocket  and  then moved across the device into the medial aspect of the pocket and  then tied with one knot laterally.  A Medtronic Reveal Plus 6191 loop  recorder was used.  Serial number [id number].  The pocket was copiously  irrigated with antibiotic containing saline solution.  The loop recorder was  secured.  The wound was washed, dried and a benzoin and Steri-Strip dressing  was applied.  Sponge, needle and instrument counts were correct at the end  of the procedure according to staff.  The patient tolerated the procedure  without apparent complication.                                               Duke Salvia, M.D.    SCK/MEDQ  D:  02/24/2004  T:  02/25/2004  Job:  [id number]   cc:   Learta Codding, M.D. [facility]

## 2011-01-05 NOTE — Assessment & Plan Note (Signed)
[name]                              CARDIOLOGY OFFICE NOTE   [name], [name]                        MRN:          [id number]  DATE:05/30/2006                            DOB:          11-17-1950    PRIMARY CARE PHYSICIAN:  Dr. Idamae Schuller A. Tower.   PULMONOLOGIST:  Dr. Marcelyn Bruins.   REASON FOR VISIT:  Cardiac follow up.   HISTORY OF PRESENT ILLNESS:  I saw [name] last in May. Since that time  she was seen back in the office with complaints of palpitations and did have  a device interrogation showing that her heart rate generally did not exceed  100 for any period of time and she had no evidence of ventricular  tachycardia. 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 She also has a history of pulmonary emboli and had  no evidence of pulmonary hypertension on this most recent echocardiogram.  She had a follow up CT scan of her chest, also in late September, described  as showing stable findings with mild hilar and mediastinal adenopathy.  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  She continues to see Dr. Shelle Iron for this.  She has had no  device discharges since her recent visit and states that her palpitations  have improved. She has planned to start a more regular exercise regimen and  also begin Weight Watchers. I reviewed her medications. No specific changes.   ALLERGIES:  DILANTIN.   CURRENT MEDICATIONS:  1. Enteric coated aspirin 81 mg p.o. daily.  2. [facility].  3. Zocor 40 mg p.o. daily.  4. Ambien 10 mg p.o. daily.  5. Nexium 40 mg p.o. daily.  6. Metoprolol 50 mg p.o. b.i.d.  7.  Synthroid 50 mcg p.o. daily.  8. Lumigan eye drops.   REVIEW OF SYSTEMS:  As found in the history of present illness. She has some  bruising on Coumadin but no major bleeding problems. She has had no cough or  hemoptysis.  She has had no syncope.   PHYSICAL EXAMINATION:  VITAL SIGNS:  Weight is 223 pounds. Blood pressure  initially checked at 140/108, repeated by me 10 minutes later at 128/82.  Heart rate 60 and regular.  GENERAL:  The patient is comfortable and in no acute distress.  NECK:  Reveals no elevated jugular venous pressure and without bruits. No  thyromegaly is noted.  LUNGS:  Clear without labored breathing. Somewhat coarse breath sounds.  CARDIAC EXAM:  Reveals a regular rate and rhythm without loud murmur or  gallop. The defibrillator pocket site is stable and well-healed in the left  upper thorax.  EXTREMITIES:  Exhibit no significant pitting edema.   IMPRESSION/RECOMMENDATIONS:  1. 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 Graciela Husbands. She has      had no recently documented ventricular tachycardia on device      interrogation. Has had some symptoms of palpitations. Plan will be to      continue her present beta blocker dosing. She brought in a record of      her home blood pressure and heart rate checks and typically her heart      rate runs in the high 50's to 60's. Blood pressure has also been fairly      well controlled at home.  2. No evidence of obstructive coronary artery disease by previous cardiac      catheterization in 2005.  3. History of pulmonary emboli and prior stroke with small patent foramen      ovale. She continues on chronic Coumadin. Also has a history of      anticardiolipin antibody.  4. 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 Her      chest CT findings are  stable. This is being followed by Dr. Shelle Iron.       Jonelle Sidle, MD     SGM/MedQ  DD:  05/30/2006  DT:  05/31/2006  Job #:  [id number]   cc:   Marne A. Milinda Antis, MD  Barbaraann Share, MD,FCCP

## 2011-01-05 NOTE — Assessment & Plan Note (Signed)
[facility]                            CARDIOLOGY OFFICE NOTE   Adriana, [name]                        MRN:          [id number]  DATE:09/24/2006                            DOB:          April 30, 1951    PHYSICIANS:  Jonelle Sidle, MD  Audrie Gallus Milinda Antis, MD  Barbaraann Share, MD,FCCP  Genene Churn. Love, M.D.   HISTORY OF PRESENT ILLNESS:  [name] is a very pleasant 60 year old  African American female patient that has a history of possible cardiac  sarcoidosis with overall normal ejection fraction but previously  documented nonsustained V-tach status post Guidant defibrillator by Dr.  Graciela Husbands.  She has no evidence of obstructive coronary disease with normal  cath in 2005, but she has a history of pulmonary emboli and prior stroke  with small patent foramen ovale and is on chronic Coumadin.  She called  in complaining of headache and hypertension and was brought in today for  follow up.  She says she has had headaches for two weeks, but Friday it  became severe, and she took her blood pressure and it was 145/100.  At  this point, she had Altace 10 mg that expired a year ago, but took it,  and it brought her blood pressure down some.  She has been taking it  daily since then, but said her blood pressure was up a bit this morning  at 150/70.  Today, she is also having stomach problems as she ran out of  her Nexium and is becoming a little bit dizzy when her blood pressure is  elevated.  She denies any chest tightness.  She feels tight in her neck  when her head is bothering her.  Denies any palpitations or presyncope.  The patient does admit to eating out more and probably getting excess  salt in her diet recently, but has quit eating salt at the table.  She  also says she has had an aneurysm clipped and is going to follow up with  Dr. Sandria Manly concerning the headaches as well.  On review of her blood  pressures taking at home, they are actually pretty good,  most ranging in  the 120/70 range, but at times in the evening they may go up to 135-140  on occasion.  Pulse has been stable.   CURRENT MEDICATIONS:  1  Coated aspirin 81 mg daily.  1. Coumadin as directed.  2. Zocor 40 mg daily.  3. Ambien 10 mg daily.  4. Nexium 40 mg daily.  5. Metoprolol 50 mg b.i.d.  6. Synthroid 50 mcg daily.  7. Lumigan drops daily.   PHYSICAL EXAMINATION:  GENERAL:  This is a very pleasant 60 year old  African American female in no acute distress.  VITAL SIGNS:  Blood pressure with large cuff on the machine was 117/77  manually.  With a regular cuff, it was 134/90, pulse 52, weight 215.  NECK:  Without JVD, bruit or thyroid enlargement.  LUNGS:  Clear anterior, posterior and lateral.  HEART:  Regular rate and rhythm at 50 beats per minute.  Normal  S1, S2.  I cannot hear a murmur, rub, bruit, thrill or heave.  ABDOMEN:  Soft without organomegaly, masses, lesions or abnormal  tenderness.  EXTREMITIES:  Without cyanosis or clubbing.  Good distal pulses.   STUDIES:  EKG:  Normal sinus bradycardia with T wave inversion in V1.  No acute change from prior tracings.   IMPRESSION:  1. Headaches.  Nonfocal exam.  2. Hypertension.  3. 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 Graciela Husbands.  4  Nonobstructive coronary artery disease on cath in 2005.  1. History of pulmonary emboli and prior stroke with patent foramen      ovale.  Coumadin has been therapeutic.  2. Chronic Coumadin therapy.  3. History of positive anticardiolipin antibody.  4. 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 Shelle Iron.  5. History of cerebral aneurysm clippings.   PLAN:  At this time, I have given her a prescription for Altace 10 mg  daily and asked her to fill this and not take her old Altace  anymore.  I  considered giving her HCTZ as well, but will see if the Altace  stabilizes her blood pressure at this time.  She is to continue to keep  track of this and call if her blood pressure does not stabilize.  I have  also asked her to make an appointment with Dr. Sandria Manly to follow up on the  headaches, as I am not convinced they are clearly related to her  hypertension.  She has an appointment to see Dr. Diona Browner back in April,  but we will see her sooner if needed.      Jacolyn Reedy, PA-C  Electronically Signed      Jonelle Sidle, MD  Electronically Signed   ML/MedQ  DD: 09/24/2006  DT: 09/24/2006  Job #: [id number]   cc:   Jonelle Sidle, MD  Audrie Gallus Milinda Antis, MD  Barbaraann Share, MD,FCCP  Genene Churn. Love, M.D.

## 2011-01-05 NOTE — H&P (Signed)
NAME:  [name], [name]                  ACCOUNT NO.:  [id number]   MEDICAL RECORD NO.:  [id number]                   PATIENT TYPE:  INP   LOCATION:  1823                                 FACILITY:  [facility]   PHYSICIAN:  Learta Codding, M.D. [facility]             DATE OF BIRTH:  [birth year]   DATE OF ADMISSION:  01/13/2004  DATE OF DISCHARGE:                                HISTORY & PHYSICAL   REFERRING PHYSICIAN:  Marne A. Milinda Antis, M.D.   CARDIOLOGIST:  New, Dr. Andee Lineman.   REASON FOR ADMISSION:  Multiple episodes of palpitations associated with  lightheadedness over the last 10 hours, as well as chest pressure.   HISTORY OF PRESENT ILLNESS:  The patient is a 60 year old African-American  female with past medical history of hypertension, __________ and TIA.  The  patient required a prior hospital admission for a TIA evaluation.  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   The patient today experienced palpitations associated with lightheadedness  and generalized weakness.  She states that these episodes would occur in  bursts and would occur all of a sudden and would last five to 10 minutes and  then somewhat get better and then start up again.  She has had complaints in  the past with this but in the last 10 hours or so, her symptoms have become  markedly worse.  More importantly, the episodes today she also experienced  chest pressure associated with tightness in the left arm and tightness in  the throat.  At present she still has some chest tightness.  There were no  initial EKG changes. The patient was then brought in by EMS and a long  rhythm strip was obtained documenting the arrhythmia.  Unfortunately, this  rhythm strip is currently not found and not with the chart, and we have  requested this through the EMS services.   ALLERGIES:  DILANTIN causes skin rash, LIPITOR caused myalgias.   MEDICATIONS:  1. Crestor 20 a day.  2. Prevacid 30 mg a  day.  3. Neurontin 100 mg p.o. q.8h.  4. Altace 20 mg daily.  5. Ambien 10 mg daily.  6. Aggrenox one tablet p.o. b.i.d.  7. Xalatan GGT eye drops.   PAST MEDICAL HISTORY:  1. History of 2 D echocardiography, November 22, 2003.  Normal echo, EF 55-65%.  2. History of adenosine Cardiolite approximately one year ago at [facility],     reportedly negative.  This was done as part of evaluation for familial     cardiomyopathy.  3. Status post TAH.  4. History of diplopia.  5. History of glaucoma.  6. History of gastric ulcer and she had bleeding in August 2004.  7. History of intracranial aneurysm, clipped in 2002.   SOCIAL HISTORY:  The patient lives in [place] by herself, is retired.  Is a prior Community education officer.  She quit smoking 50 years ago.  She  takes  water aerobics and drinks occasional alcohol.   FAMILY HISTORY:  Notable for mother a diabetic and father, who died from  cardiomyopathy in his 53s.  Brother died from cardiomyopathy as well as a  sister.   REVIEW OF SYSTEMS:  Positive for chills and sweats.  Positive for chest  pain, shortness of breath, orthopnea, palpitations, and presyncope.  No  frequency or dysuria.  Positive for weakness and anxiety.  No nausea and  vomiting. Positive for cold intolerance.   PHYSICAL EXAMINATION:  VITAL SIGNS:  Blood pressure 155/67, heart rate 63,  respirations are 22.  GENERAL:  An African-American female in no apparent distress.  HEENT:  NCAT, PERRLA.  Sclerae clear.  NECK:  Supple.  Right-sided carotid bruit.  CARDIAC:  Regular rate and rhythm, normal S1, S2.  CHEST:  Lungs with clear breath sounds bilaterally.  ABDOMEN:  Soft, nontender, no rebound or guarding.  GENITOURINARY, RECTAL:  Deferred.  EXTREMITIES:  No cyanosis, clubbing, or edema.  NEUROLOGIC:  The patient is alert and oriented x3, nonfocal.   Chest x-ray shows cardiomegaly but no CHF.  EKG:  Normal sinus rhythm with  no acute ischemic changes, early repolarization,  probably normal variant.   LABORATORY DATA:  CBC is pending, BUN is 12, creatinine is 0.8, sodium is  139, potassium is 3.7.  Myoglobin and CK-MB are all within normal limits.  Troponin increased from 0.95 to 3.46.  BNP 33.9.   IMPRESSION AND PLAN:  1. Palpitations.  The patient had palpitations that were associated with     some type of rhythm abnormality.  According to the patient there was a     long rhythm strip obtained in the ambulance.   Dictation ended at this point.                                                Learta Codding, M.D. [facility]    GED/MEDQ  D:  01/14/2004  T:  01/14/2004  Job:  [id number]   cc:   Marne A. Milinda Antis, M.D. [facility]

## 2011-01-05 NOTE — Assessment & Plan Note (Signed)
[facility]                              CARDIOLOGY OFFICE NOTE   [name], [name]                        MRN:          [id number]  DATE:05/06/2006                            DOB:          25-Apr-1951    [name] is a 60 year old female with a history of nonsustained VT  and possible cardiac sarcoid who is here today for further evaluation of  palpitations.  She states that she gets multiple palpitations and gets them  at least on a daily basis.  She uses the Valsalva maneuver with success if  they do not stop within seconds on their own.  She does not get chest pain  with them.  She does get a feeling of being slightly weak with them.  She  has not felt her defibrillator fire.  She is compliant with her medications  as well as with a low sodium diet.  She does not use any caffeine or over-  the-counter medication which contains stimulants.  She has not been having  chest pain.  She is also concerned about the effects of long-term Coumadin  on her body, especially her liver and she is concerned that some of her  palpitations and other symptoms can be coming from fibromyalgia which she  says her rheumatologist told her she had.  She also has concerns about what  she describes as a long-term bruise on her leg which she states has been  there for a couple of months.  Of note, she says that she checks her blood  pressure regularly at home and it is always within normal limits with a  systolic in the 120s and diastolic in the 70s and 80s, but she says her  blood pressure is consistently elevated when she goes to the doctor.   CURRENT MEDICATIONS:  1. Aspirin 81 mg a day.  2. Coumadin 7.5 mg on Monday and Wednesday and 10 mg a day the other days      of the week.  3. Zocor 40 mg a day.  4. Ambien 10 mg a day.  5. Nexium 40 mg daily.  6. Metoprolol 50 mg b.i.d.  7. Synthroid 50 mcg daily.  8. Lumigan eye drops daily.   PHYSICAL EXAMINATION:   GENERAL APPEARANCE:  She is a well-developed, obese  African American female in no acute distress.  VITAL SIGNS:  Her weight is 223 pounds which is 5 pounds higher than it was  in May of 2007.  Blood pressure is 140/80 in the right and 134/108 in the  left with heart rate of 59.  NECK:  There is no JVD, no thyromegaly and no carotid bruits are  appreciated.  CHEST:  Clear to auscultation bilaterally.  CARDIOVASCULAR:  Her heart has regular rate and rhythm with an S1 and  S2,with a soft systolic murmur noted.  ABDOMEN:  Soft and nontender with active bowel sounds.  EXTREMITIES:  There is no edema noted.  Distal pulses are intact in all four  extremities.  SKIN:  There is an area of discolored skin on her  right lower extremity in  the mid tibial portion but this does not show any fluctuance.  There is no  hematoma or edema in the area.  It is not tender and there is no wound.   EKG is sinus bradycardia, rate 59 beats per minute with no acute ischemic  changes.   IMPRESSION:  1. History of nonsustained ventricular tachycardia:  [name]' device was      interrogated today.  She has had no ventricular tachycardia since the      last interrogation in January.  2. Palpitations:  She only has a single lead device (Guidant) so it is not      possible to detect any atrial arrhythmia.  However, on histograms, her      heart rate rarely reaches 100 and there is no sustained heart rate      greater than 110.  Her heart rate is generally sustained between 70 and      90.  I discussed this with [name] and explained to her that if the      palpitations were rapid and lasting long enough to affect her heart      rate, they would show up on the histograms.  I, therefore, advised her      that most likely she is only having a very few beats and because it is      not ventricular in nature, it is not a threat to her or a danger.  She      seemed to appreciate the reassurance.  3. History of  abnormal chest CT suggesting possible sarcoid:  She has not      seen Dr. Shelle Iron since 2005.  I asked her if she wished to pursue this      and she said she did, as in his note, he recommended periodic CT scans      to follow up on the lymphadenopathy.  A CT scan has therefore been      scheduled for her in our office and a follow-up appointment with Dr.      Shelle Iron has also been made.  She is encouraged to follow up with Dr.      Milinda [name] as well.  4. Anticoagulation:  Because she has a small patent foramen ovale with a      left-to-right shunt and a history of pulmonary emboli, she is      encouraged to remain on Coumadin.  I advised her that it would be a      lifelong drug.  She said that Dr. Milinda [name] is checking her liver functions      on a regular basis and I encouraged her to keep those appointments.  5. Hyperlipidemia:  She is followed by Dr. Milinda [name] for this.  6. Hypertension:  [name] states that her blood pressure was fine at      home.  She says she is compliant with her medications.  I advised her      that I would not make any medication changes but she should take her      blood pressure every so often and record it and bring it to the      doctor's office to make sure that no medication adjustment is needed.                                 Theodore Demark, PA-C  Madolyn Frieze Jens Som, MD, [facility]   RB/MedQ  DD:  05/06/2006  DT:  05/07/2006  Job #:  [id number]   cc:   Adriana A. Milinda Antis, MD  Barbaraann Share, MD,FCCP

## 2011-01-05 NOTE — Cardiovascular Report (Signed)
NAME:  [name], [name]                  ACCOUNT NO.:  [id number]   MEDICAL RECORD NO.:  [id number]                   PATIENT TYPE:  INP   LOCATION:  4729                                 FACILITY:  [facility]   PHYSICIAN:  Rollene Rotunda, M.D.                DATE OF BIRTH:  1950/10/04   DATE OF PROCEDURE:  01/14/2004  DATE OF DISCHARGE:                              CARDIAC CATHETERIZATION   PRIMARY CARE PHYSICIAN:  Marne A. Milinda Antis, M.D.   PROCEDURES PERFORMED:   CARDIOLOGIST:  Rollene Rotunda, M.D.   REASON FOR PRESENTATION:  Evaluate patient with non-Q wave myocardial  infarction.   DESCRIPTION OF PROCEDURE:  Left heart catheterization was performed via the  right femoral artery.  The artery was cannulated using an anterior wall  puncture.  A #6 French arterial sheath was inserted via the modified  Seldinger technique.  Preformed Judkins and a pigtail catheter were  utilized.   The patient tolerated the procedure well and left lab in stable condition.   RESULTS:   HEMODYNAMIC DATA:  LV 139/20.  Aortic output 147/63.   ANGIOGRAPHIC DATA:  Coronaries  Left Main:  The left main was normal.   LAD:  The LAD was normal.  There was a small first diagonal, which was  normal.  There was a large second diagonal, which was normal.   Circumflex Artery:  The circumflex was normal.  OM-1 was small.  OM-2 was  moderate and normal.  OM-3 was large and normal.   Right Coronary Artery:  The right coronary artery was a dominant vessel.  It  was normal.  It was moderate in size.  There was a small PDA and  posterolateral, which were normal.   VENTRICULOGRAPHIC DATA:  Left Ventriculogram:  The left ventriculogram was  approximately 50% with anterior anterolateral and mid inferior akinesis.   CONCLUSION:  Non-Q wave myocardial infarction with regional all motion  abnormalities.  This is consistent with a Tako-Tsube cardiomyopathy.  Another alternate explanation could be coronary spasm.   There are no fixed  obstructive lesions and no evidence of ruptured plaque.   PLAN:  The patient will be managed medically.                                               Rollene Rotunda, M.D.    JH/MEDQ  D:  01/14/2004  T:  01/15/2004  Job:  [id number]   cc:   Marne A. Milinda Antis, M.D. [facility]

## 2011-01-09 ENCOUNTER — Other Ambulatory Visit: Payer: Self-pay | Admitting: *Deleted

## 2011-01-22 ENCOUNTER — Encounter: Payer: Self-pay | Admitting: Family Medicine

## 2011-01-22 ENCOUNTER — Ambulatory Visit (INDEPENDENT_AMBULATORY_CARE_PROVIDER_SITE_OTHER): Payer: Medicare Other | Admitting: Family Medicine

## 2011-01-22 DIAGNOSIS — H7390 Unspecified disorder of tympanic membrane, unspecified ear: Secondary | ICD-10-CM

## 2011-01-22 DIAGNOSIS — J029 Acute pharyngitis, unspecified: Secondary | ICD-10-CM

## 2011-01-22 DIAGNOSIS — R209 Unspecified disturbances of skin sensation: Secondary | ICD-10-CM

## 2011-01-22 DIAGNOSIS — R200 Anesthesia of skin: Secondary | ICD-10-CM | POA: Insufficient documentation

## 2011-01-22 MED ORDER — AZELASTINE HCL 0.1 % NA SOLN: 2.0000 | Freq: Two times a day (BID) | NASAL | Status: DC

## 2011-01-22 NOTE — Assessment & Plan Note (Signed)
0/4 centor.  Doubt strep. No other viral sxs to suggest URTI. No evidence of thrush. Likely PND irritating throat, esp since started since ran out of astelin. Refilled, rec start nasal saline spray.  Update [place] if not improving as expected.

## 2011-01-22 NOTE — Patient Instructions (Signed)
Refilled astelin. Start nasal saline irrigation 2-3 times daily. i think the sore throat is coming from drainage down the back of the throat. Update [place] if not improving. Call us with quesitons. Consider seeing ENT for second opinion on left eardrum.

## 2011-01-22 NOTE — Assessment & Plan Note (Signed)
Could just be scarring, however not typical of scar.  rec second opinion r/o cholesteatoma.  Offered referral to ENT.  Pt states she has ENT doc, will call to make appt.

## 2011-01-22 NOTE — Assessment & Plan Note (Addendum)
Only left big toe, exam normal today. Last glucose 100 08/2010. Toe lesion not consistent with splinter hemorrhage, no murmur, no other fingernail lesions, good pulses. rec monitor for now.

## 2011-01-22 NOTE — Progress Notes (Signed)
  Subjective:    Patient ID: [name], female    DOB: March 11, 1951, 60 y.o.   MRN: [id number]  HPI CC: ST and numb toe  Complicated pt new to me with h/o pulm sarcoid, hypothyroid, HTN, PE off coumadin, MVP, takotsubo syndrome with AICD in place presents with 2wk h/o ST with PND.  Mild watery itchy eyes.  No congestion, RN.  No fevers/chills.  Mild cough from PND.  No abd pain, n/v/d.  No spots inside mouth.  H/o seasonal allergies on astelin and allegra which help.  out of astelin for several weeks now, ST started after ran out of astelin.  Also noticed rash across stomach, black spot left toe.  Occasional numbness.  No pain.  Doesn't spread along feet.  Rash now resolved.  no asthma.  No sick contacts at home.  No smokers at home.  No recent abx use.  H/o aneurysm s/p repair 2000, found incidentally.  Review of Systems Per HPI    Objective:   Physical Exam  Nursing note and vitals reviewed. Constitutional: She appears well-developed and well-nourished. No distress.  HENT:  Head: Normocephalic and atraumatic.  Right Ear: Hearing, external ear and ear canal normal.  Left Ear: Hearing, external ear and ear canal normal.  Nose: Nose normal. No mucosal edema or rhinorrhea. Right sinus exhibits no maxillary sinus tenderness and no frontal sinus tenderness. Left sinus exhibits no maxillary sinus tenderness and no frontal sinus tenderness.  Mouth/Throat: Uvula is midline, oropharynx is clear and moist and mucous membranes are normal. No oropharyngeal exudate.       Mild congested TMs bilaterally L TM with ?scar vs growth anteriorly  Eyes: Conjunctivae and EOM are normal. Pupils are equal, round, and reactive to light. No scleral icterus.  Neck: Normal range of motion. Neck supple. No JVD present. No thyromegaly present.  Cardiovascular: Normal rate, regular rhythm, normal heart sounds and intact distal pulses.   No murmur heard. Pulses:      Dorsalis pedis pulses are 2+ on the right side,  and 2+ on the left side.       Posterior tibial pulses are 2+ on the right side, and 2+ on the left side.  Pulmonary/Chest: Effort normal and breath sounds normal. No respiratory distress. She has no wheezes. She has no rales.  Lymphadenopathy:    She has no cervical adenopathy.  Neurological: No sensory deficit.       Monofilament intact throughout L foot  Skin: Skin is warm and dry. No rash noted.       Left big toe proximal medial nail with small dark discoloration, benign.            Assessment & Plan:

## 2011-02-06 ENCOUNTER — Other Ambulatory Visit: Payer: Self-pay | Admitting: *Deleted

## 2011-02-06 MED ORDER — LEVOTHYROXINE SODIUM 50 MCG PO TABS: 50.0000 ug | ORAL_TABLET | Freq: Every day | ORAL | Status: DC

## 2011-03-30 ENCOUNTER — Encounter: Payer: Self-pay | Admitting: Family Medicine

## 2011-03-30 ENCOUNTER — Ambulatory Visit (INDEPENDENT_AMBULATORY_CARE_PROVIDER_SITE_OTHER): Payer: Medicare Other | Admitting: Family Medicine

## 2011-03-30 VITALS — BP 112/70 | HR 60 | Temp 98.7°F | Wt 209.8 lb

## 2011-03-30 DIAGNOSIS — J029 Acute pharyngitis, unspecified: Secondary | ICD-10-CM

## 2011-03-30 NOTE — Progress Notes (Signed)
This past Saturday she didn't feel well.  She didn't have diarrhea or vomiting, but "I just feel sick."  ST since then.  Unclear about sick contacts.  No fevers.  HA, frontal but not in maxillary area.  On astelin and allegra.  HA is better now. ST continues, throat is more sensitive.  She's still able to swallow.  No cough but clearing her throat.  She noted a different taste her throat. No rhinorrhea, no sputum.  She felt a little dizzy yesterday, this was brief and it self resolved.   Meds, vitals, and allergies reviewed.   ROS: See HPI.  Otherwise, noncontributory.  GEN: nad, alert and oriented HEENT: mucous membranes moist, tm w/o erythema (nonacute change noted on L TM- redundant tissue inferiorly- she has f/u with ENT about this), nasal exam w/o erythema but she is stuffy L>R, clear discharge noted,  OP with cobblestoning- mild NECK: supple w/o LA CV: rrr.   PULM: ctab, no inc wob EXT: no edema SKIN: no acute rash   RST neg.

## 2011-03-30 NOTE — Patient Instructions (Signed)
Drink plenty of fluids, take tylenol as needed, and gargle with warm salt water for your throat.  This should gradually improve.  Take care.  Let us know if you have other concerns.    

## 2011-04-01 NOTE — Assessment & Plan Note (Addendum)
RST neg, nontoxic and okay for outpatient f/u.  Likely viral and should resolve.  F/u prn.  Supportive tx o/w.  She agrees.

## 2011-04-17 ENCOUNTER — Encounter: Payer: Self-pay | Admitting: Internal Medicine

## 2011-04-17 ENCOUNTER — Ambulatory Visit (INDEPENDENT_AMBULATORY_CARE_PROVIDER_SITE_OTHER): Payer: Medicare Other | Admitting: Cardiovascular Disease

## 2011-04-17 ENCOUNTER — Encounter: Payer: Self-pay | Admitting: Cardiovascular Disease

## 2011-04-17 ENCOUNTER — Ambulatory Visit (INDEPENDENT_AMBULATORY_CARE_PROVIDER_SITE_OTHER): Payer: Medicare Other | Admitting: *Deleted

## 2011-04-17 VITALS — BP 113/71 | HR 61 | Ht 66.5 in | Wt 212.0 lb

## 2011-04-17 DIAGNOSIS — I10 Essential (primary) hypertension: Secondary | ICD-10-CM

## 2011-04-17 DIAGNOSIS — R002 Palpitations: Secondary | ICD-10-CM

## 2011-04-17 DIAGNOSIS — D869 Sarcoidosis, unspecified: Secondary | ICD-10-CM

## 2011-04-17 DIAGNOSIS — I472 Ventricular tachycardia: Secondary | ICD-10-CM

## 2011-04-17 DIAGNOSIS — E785 Hyperlipidemia, unspecified: Secondary | ICD-10-CM

## 2011-04-17 DIAGNOSIS — R0989 Other specified symptoms and signs involving the circulatory and respiratory systems: Secondary | ICD-10-CM

## 2011-04-17 LAB — ICD DEVICE OBSERVATION
DEV-0020ICD: NEGATIVE
HV IMPEDENCE: 52 Ohm
RV LEAD AMPLITUDE: 11.5 mv
RV LEAD THRESHOLD: 0.7 V
TOT-0001: 1
TOT-0002: 0
TZAT-0001FASTVT: 1
TZAT-0002FASTVT: NEGATIVE
TZAT-0019FASTVT: 5 V
TZAT-0020FASTVT: 1 ms
TZON-0003FASTVT: 286 ms
TZON-0004FASTVT: 7
TZON-0005FASTVT: 1
TZST-0001FASTVT: 4
TZST-0001FASTVT: 6
TZST-0001FASTVT: 7
TZST-0003FASTVT: 41 J
TZST-0003FASTVT: 41 J

## 2011-04-17 NOTE — Assessment & Plan Note (Signed)
Sound benign.  Offerred event monitor to R/O atrial arrhythmia but she feels better the last week.  Primary F/U will be with SK since all of her issues involve palpitations, VT and AICD

## 2011-04-17 NOTE — Assessment & Plan Note (Signed)
Normal AICD function  Single lead device with no significant VT.  F/U Dr Graciela Husbands

## 2011-04-17 NOTE — Progress Notes (Signed)
ICD check 

## 2011-04-17 NOTE — Assessment & Plan Note (Signed)
Well controlled.  Continue current medications and low sodium Dash type diet.    

## 2011-04-17 NOTE — Patient Instructions (Signed)
Your physician recommends that you schedule a follow-up appointment in: 3 MONTHS WITH DR Graciela Husbands  Your physician recommends that you continue on your current medications as directed. Please refer to the Current Medication list given to you today.  Your physician has requested that you have an echocardiogram. Echocardiography is a painless test that uses sound waves to create images of your heart. It provides your doctor with information about the size and shape of your heart and how well your heart's chambers and valves are working. This procedure takes approximately one hour. There are no restrictions for this procedure. PT'S CONVENIENCE  DX PALPITATIONS

## 2011-04-17 NOTE — Assessment & Plan Note (Signed)
F/U Dr Delford Field pulmonary exam stable

## 2011-04-17 NOTE — Progress Notes (Signed)
[name] is seen today for F/U. She has previously seen Dr. Antoine Poche and Graciela Husbands as well as Dr. Diona Browner. I am not sure why she has been on my schedule the last two times. 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 I don't see that this was ever documented by MRI. 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 Her generator was just changed and she has some issues with keloid formation. She is seeing Dr Shon Hough for this. 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 Pain is not always exertional, occasionally radiates to the right side. It lasts a minute or two then subsides. No associated diaphoresis, dyspnea or syncope. Compliant with meds. Sees Dr Shelle Iron for pulmonary sarcoid and this has been stable with no cough, sputum or hemoptysis ECG from May reviewed and normal with no BBB  ECG today also normal.  Having palpitations.  Skips and occasional rapid beats lasting a few minutes.  Will have AICD interogated.  Reviewed AICD log from 2/12 and no mode switches. Last echo 4/11 normal EF 55% no RWMA;s  Reviewed device with Gunnar Fusi.  Single lead [employer] so no atrial activity recorded.  Only one 3 beat run of NSVT seen  ROS: Denies fever, malais, weight loss, blurry vision, decreased visual acuity, cough, sputum, SOB, hemoptysis, pleuritic pain, palpitaitons, heartburn, abdominal pain, melena, lower extremity edema, claudication, or rash.  All other systems reviewed and negative  General: Affect appropriate Healthy:  appears stated age HEENT: normal Neck supple with no adenopathy JVP normal no bruits no thyromegaly Lungs clear with no wheezing and good diaphragmatic motion Heart:  S1/S2 no murmur,rub, gallop or click PMI normal Abdomen: benighn, BS positve, no tenderness, no AAA no bruit.  No HSM or HJR Distal pulses intact with no bruits No edema Neuro non-focal Skin warm and dry No muscular weakness AICD under left clavicle   Current Outpatient Prescriptions  Medication Sig Dispense  Refill  . acetaminophen (TYLENOL) 650 MG CR tablet Take 650 mg by mouth every 8 (eight) hours as needed.        [name] amoxicillin (AMOXIL) 500 MG capsule Take 500 mg by mouth 2 (two) times daily.        [name] aspirin 81 MG tablet Take 81 mg by mouth daily.        [name] azelastine (ASTELIN) 137 MCG/SPRAY nasal spray Place 2 sprays into the nose 2 (two) times daily. Use in each nostril as directed  30 mL  6  . bimatoprost (LUMIGAN) 0.03 % ophthalmic solution 1 drop as directed.        . diphenhydrAMINE (BENADRYL) 25 MG tablet Take 25 mg by mouth as directed.        . fexofenadine (ALLEGRA) 180 MG tablet Take 180 mg by mouth daily as needed.        . hydrochlorothiazide 25 MG tablet Take 1 tablet (25 mg total) by mouth daily.  45 tablet  3  . ibuprofen (ADVIL,MOTRIN) 200 MG tablet Take 200 mg by mouth every 6 (six) hours as needed.        [name] levothyroxine (SYNTHROID, LEVOTHROID) 50 MCG tablet Take 1 tablet (50 mcg total) by mouth daily.  30 tablet  11  . metoprolol tartrate (LOPRESSOR) 25 MG tablet Take 1 tablet (25 mg total) by mouth 2 (two) times daily.  180 tablet  3  . naproxen sodium (ANAPROX) 220 MG tablet Take 220 mg by mouth 2 (two) times daily with a meal.        .  pantoprazole (PROTONIX) 40 MG tablet Take 40 mg by mouth daily as needed.        . potassium chloride (KLOR-CON) 10 MEQ CR tablet Take 5 mEq by mouth daily.        . ramipril (ALTACE) 5 MG capsule Take 5 mg by mouth daily.        . simvastatin (ZOCOR) 40 MG tablet Take 20 mg by mouth at bedtime.        . traMADol (ULTRAM) 50 MG tablet Take 50 mg by mouth every 8 (eight) hours as needed.        . vitamin C (ASCORBIC ACID) 500 MG tablet Take 500 mg by mouth daily.        [name] VITAMIN D, CHOLECALCIFEROL, PO Take 1 tablet by mouth daily.        [name] zolpidem (AMBIEN) 10 MG tablet Take 10 mg by mouth at bedtime as needed.          Allergies  Amitriptyline hcl; Atorvastatin; Carbamazepine; Cetirizine hcl; Codeine; Paroxetine; and  Rosuvastatin  Electrocardiogram:  NSR 61 normal ECG  Assessment and Plan

## 2011-04-17 NOTE — Assessment & Plan Note (Signed)
Cholesterol is at goal.  Continue current dose of statin and diet Rx.  No myalgias or side effects.  F/U  LFT's in 6 months. Lab Results  Component Value Date   LDLCALC 89 09/11/2010

## 2011-04-25 ENCOUNTER — Other Ambulatory Visit: Payer: Self-pay

## 2011-04-25 ENCOUNTER — Ambulatory Visit (HOSPITAL_COMMUNITY): Payer: Medicare Other | Attending: Cardiovascular Disease

## 2011-04-25 DIAGNOSIS — I10 Essential (primary) hypertension: Secondary | ICD-10-CM | POA: Insufficient documentation

## 2011-04-25 DIAGNOSIS — E785 Hyperlipidemia, unspecified: Secondary | ICD-10-CM | POA: Insufficient documentation

## 2011-04-25 DIAGNOSIS — R002 Palpitations: Secondary | ICD-10-CM

## 2011-04-25 DIAGNOSIS — I319 Disease of pericardium, unspecified: Secondary | ICD-10-CM | POA: Insufficient documentation

## 2011-04-25 DIAGNOSIS — I079 Rheumatic tricuspid valve disease, unspecified: Secondary | ICD-10-CM | POA: Insufficient documentation

## 2011-04-25 DIAGNOSIS — I379 Nonrheumatic pulmonary valve disorder, unspecified: Secondary | ICD-10-CM | POA: Insufficient documentation

## 2011-04-25 DIAGNOSIS — R000 Tachycardia, unspecified: Secondary | ICD-10-CM | POA: Insufficient documentation

## 2011-04-25 DIAGNOSIS — I059 Rheumatic mitral valve disease, unspecified: Secondary | ICD-10-CM | POA: Insufficient documentation

## 2011-04-25 DIAGNOSIS — I369 Nonrheumatic tricuspid valve disorder, unspecified: Secondary | ICD-10-CM

## 2011-04-25 MED ORDER — ZOLPIDEM TARTRATE 10 MG PO TABS: 10.0000 mg | ORAL_TABLET | Freq: Every evening | ORAL | Status: DC | PRN

## 2011-04-25 NOTE — Telephone Encounter (Signed)
[facility] faxed refill request Ambien 10mg . Form is on your shelf in the in box.

## 2011-04-25 NOTE — Telephone Encounter (Signed)
Form in IN box 

## 2011-04-25 NOTE — Telephone Encounter (Signed)
Completed form faxed to [phone].

## 2011-04-26 NOTE — Progress Notes (Signed)
pt aware of results [name]  

## 2011-05-08 ENCOUNTER — Encounter: Payer: Medicare Other | Admitting: Internal Medicine

## 2011-05-17 LAB — BASIC METABOLIC PANEL
BUN: 15
Calcium: 9.5
GFR calc non Af Amer: >60
Glucose, Bld: 94
Sodium: 141

## 2011-05-17 LAB — PROTIME-INR
INR: 0.9
Prothrombin Time: 12.5

## 2011-05-17 LAB — CBC
Platelets: 220
RDW: 13.4

## 2011-05-17 LAB — APTT: aPTT: 30

## 2011-07-17 ENCOUNTER — Ambulatory Visit (INDEPENDENT_AMBULATORY_CARE_PROVIDER_SITE_OTHER): Payer: Medicare Other

## 2011-07-17 ENCOUNTER — Telehealth: Payer: Self-pay | Admitting: *Deleted

## 2011-07-17 DIAGNOSIS — K7689 Other specified diseases of liver: Secondary | ICD-10-CM

## 2011-07-17 DIAGNOSIS — E559 Vitamin D deficiency, unspecified: Secondary | ICD-10-CM

## 2011-07-17 DIAGNOSIS — Z23 Encounter for immunization: Secondary | ICD-10-CM

## 2011-07-17 DIAGNOSIS — K219 Gastro-esophageal reflux disease without esophagitis: Secondary | ICD-10-CM

## 2011-07-17 DIAGNOSIS — E785 Hyperlipidemia, unspecified: Secondary | ICD-10-CM

## 2011-07-17 DIAGNOSIS — E039 Hypothyroidism, unspecified: Secondary | ICD-10-CM

## 2011-07-17 DIAGNOSIS — I10 Essential (primary) hypertension: Secondary | ICD-10-CM

## 2011-07-17 MED ORDER — ZOSTER VACCINE LIVE 19400 UNT/0.65ML ~~LOC~~ SOLR: 0.6500 mL | Freq: Once | SUBCUTANEOUS | Status: AC

## 2011-07-17 NOTE — Telephone Encounter (Signed)
Pt is asking to have lab work and zostavax.  Wants the order for zostavax sent to [facility].

## 2011-07-17 NOTE — Telephone Encounter (Signed)
Will refill electronically - to [facility] (for zostavax) Will do future lab orders, please schedule PE in end of jan/ early feb in any 30 min slot

## 2011-07-20 ENCOUNTER — Encounter: Payer: Medicare Other | Admitting: Internal Medicine

## 2011-07-24 ENCOUNTER — Other Ambulatory Visit: Payer: Self-pay | Admitting: Family Medicine

## 2011-07-24 DIAGNOSIS — Z1231 Encounter for screening mammogram for malignant neoplasm of breast: Secondary | ICD-10-CM

## 2011-08-07 NOTE — Telephone Encounter (Signed)
Left vm for pt to callback 

## 2011-08-08 ENCOUNTER — Encounter: Payer: Self-pay | Admitting: Internal Medicine

## 2011-08-08 ENCOUNTER — Ambulatory Visit (INDEPENDENT_AMBULATORY_CARE_PROVIDER_SITE_OTHER): Payer: Medicare Other | Admitting: Internal Medicine

## 2011-08-08 DIAGNOSIS — R002 Palpitations: Secondary | ICD-10-CM

## 2011-08-08 DIAGNOSIS — I472 Ventricular tachycardia: Secondary | ICD-10-CM

## 2011-08-08 LAB — ICD DEVICE OBSERVATION
CHARGE TIME: 8.8 s
DEVICE MODEL ICD: 266301
RV LEAD AMPLITUDE: 11.8 mv
RV LEAD THRESHOLD: 0.7 V
TZAT-0001FASTVT: 1
TZAT-0002FASTVT: NEGATIVE
TZAT-0005FASTVT: 81 pct
TZAT-0018FASTVT: NEGATIVE
TZAT-0019FASTVT: 5 V
TZAT-0020FASTVT: 1 ms
TZON-0003FASTVT: 286 ms
TZST-0001FASTVT: 4
TZST-0001FASTVT: 7
TZST-0003FASTVT: 41 J
TZST-0003FASTVT: 41 J

## 2011-08-08 NOTE — Patient Instructions (Signed)
Your physician recommends that you have lab work today: bmp/tsh  Your physician recommends that you schedule a follow-up appointment in: 3 months with Kristin/Paula for a device check.  Your physician wants you to follow-up in: 1 year with Dr. Graciela Husbands. You will receive a reminder letter in the mail two months in advance. If you don't receive a letter, please call our office to schedule the follow-up appointment.

## 2011-08-08 NOTE — Assessment & Plan Note (Signed)
The patient's device was interrogated.  The information was reviewed. No changes were made in the programming.    

## 2011-08-08 NOTE — Assessment & Plan Note (Signed)
No ventricular tachycardia; however she does have a blip under the ventricular histogram suggests that her palpitations may be PVCs. We have no way of identifying this per se. Will check her potassium as her magnesium was checked 2 weeks ago by Dr. Randa Evens and was normal; also check her TSH

## 2011-08-08 NOTE — Assessment & Plan Note (Signed)
As above.

## 2011-08-08 NOTE — Progress Notes (Signed)
HPI  [name] is a 60 y.o. 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 She underwent further generator replacement in April2011. She has done well since    Her breathing is stable and without chestpain she is having Palpitations particularly evening notable more on her left side. Her calcium was recently checked was up and it was back down  Past Medical History  Diagnosis Date  . Automatic implantable cardiac defibrillator in situ   . Paroxysmal ventricular tachycardia   . Ostium secundum type atrial septal defect   . Unspecified transient cerebral ischemia   . Takotsubo syndrome   . Otalgia, unspecified   . Other malaise and fatigue   . Unspecified vitamin D deficiency   . Headache   . Tachycardia, unspecified   . Other pulmonary embolism and infarction   . Mitral valve disorders   . Other chronic nonalcoholic liver disease   . Myalgia and myositis, unspecified   . Cerebral aneurysm, nonruptured   . Unspecified hypothyroidism   . Unspecified essential hypertension   . Other and unspecified hyperlipidemia   . Esophageal reflux   . Diverticulosis of colon (without mention of hemorrhage)   . Depressive disorder, not elsewhere classified   . Allergic rhinitis, cause unspecified   . Enlargement of lymph nodes   . Sarcoidosis   . Obstructive sleep apnea (adult) (pediatric)   . GI bleed   . PUD (peptic ulcer disease)     Past Surgical History  Procedure Date  . Partial hysterectomy     Fibroids  . Cerebral aneurysm repair 02/1999  . Knee arthroscopy     bilateral  . Cardiac defibrillator placement     Guidant  . Adenosine cardiolite 2003    neg, EF 50%  . Carotid dopplers 2006    neg  . Hospitalization 2004    GI bleed, PUD, diverticulosis (EGD,colonscopy)  . Hospitalization     PE, NSVT, s/p defib  . Abi 2006    normal    Current Outpatient Prescriptions  Medication  Sig Dispense Refill  . acetaminophen (TYLENOL) 650 MG CR tablet Take 650 mg by mouth every 8 (eight) hours as needed.        [name] aspirin 81 MG tablet Take 81 mg by mouth daily.        [name] azelastine (ASTELIN) 137 MCG/SPRAY nasal spray Place 2 sprays into the nose 2 (two) times daily. Use in each nostril as directed  30 mL  6  . bimatoprost (LUMIGAN) 0.03 % ophthalmic solution 1 drop as directed.        . hydrochlorothiazide 25 MG tablet Take 1 tablet (25 mg total) by mouth daily.  45 tablet  3  . ibuprofen (ADVIL,MOTRIN) 200 MG tablet Take 200 mg by mouth every 6 (six) hours as needed.        [name] levothyroxine (SYNTHROID, LEVOTHROID) 50 MCG tablet Take 1 tablet (50 mcg total) by mouth daily.  30 tablet  11  . metoprolol tartrate (LOPRESSOR) 25 MG tablet Take 1 tablet (25 mg total) by mouth 2 (two) times daily.  180 tablet  3  . naproxen sodium (ANAPROX) 220 MG tablet Take 220 mg by mouth 2 (two) times daily with a meal.        . polyethylene glycol (MIRALAX / GLYCOLAX) packet Take 17 g by mouth 4 (four) times daily.        . potassium chloride (KLOR-CON)  10 MEQ CR tablet Take 5 mEq by mouth daily.        . ramipril (ALTACE) 5 MG capsule Take 5 mg by mouth daily.        . simvastatin (ZOCOR) 40 MG tablet Take 20 mg by mouth at bedtime.        . traMADol (ULTRAM) 50 MG tablet Take 50 mg by mouth every 8 (eight) hours as needed.        . vitamin C (ASCORBIC ACID) 500 MG tablet Take 500 mg by mouth daily.        [name] VITAMIN D, CHOLECALCIFEROL, PO Take 1 tablet by mouth daily.        [name] zolpidem (AMBIEN) 10 MG tablet Take 1 tablet (10 mg total) by mouth at bedtime as needed.  90 tablet  1    Allergies  Allergen Reactions  . Amitriptyline Hcl     REACTION: ? reaction  . Atorvastatin     REACTION: severe muscle pain  . Cetirizine Hcl     REACTION: sleepy  . Codeine     REACTION: itching  . Paroxetine     REACTION: ? reaction  . Rosuvastatin     REACTION: severe muscle pain  . Carbamazepine Rash     REACTION: ? reaction    Review of Systems negative except from HPI and PMH  Physical Exam Well developed and well nourished in no acute distress HENT normal E scleral and icterus clear Neck Supple JVP flat; carotids brisk and full Clear to ausculation Regular rate and rhythm, no murmurs gallops or rub Soft with active bowel sounds No clubbing cyanosis none Edema Alert and oriented, grossly normal motor and sensory function Skin Warm and Dry   Assessment and  Plan

## 2011-08-09 LAB — BASIC METABOLIC PANEL
GFR: 78.34 mL/min (ref >60.00)
Potassium: 4.2 mEq/L (ref 3.5–5.1)
Sodium: 140 mEq/L (ref 135–145)

## 2011-08-09 LAB — TSH: TSH: 0.85 u[IU]/mL (ref 0.35–5.50)

## 2011-08-15 ENCOUNTER — Telehealth: Payer: Self-pay | Admitting: Internal Medicine

## 2011-08-15 NOTE — Telephone Encounter (Signed)
Lm w/pt that Dr. Graciela Husbands checked her Thyroid on 12/19 but has not reviewed the results. Will call her once he has reviewed.

## 2011-08-15 NOTE — Telephone Encounter (Signed)
New Msg: pt calling wanting to discuss with Md about checking pt thyroid. Please return pt call to discuss further.

## 2011-08-21 DIAGNOSIS — I639 Cerebral infarction, unspecified: Secondary | ICD-10-CM

## 2011-08-21 HISTORY — DX: Cerebral infarction, unspecified: I63.9

## 2011-08-23 NOTE — Telephone Encounter (Signed)
Fu call Pt want to know these test results please call her back

## 2011-08-23 NOTE — Telephone Encounter (Signed)
Patient notified as instructed by telephone. Pt said would have to call back and schedule CPX later.

## 2011-08-23 NOTE — Telephone Encounter (Signed)
I spoke with the patient and made her aware of her results. 

## 2011-09-03 ENCOUNTER — Ambulatory Visit
Admission: RE | Admit: 2011-09-03 | Discharge: 2011-09-03 | Disposition: A | Discharge status: Home / Self Care | Payer: Medicare Other | Source: Ambulatory Visit | Attending: Family Medicine | Admitting: Family Medicine

## 2011-09-03 DIAGNOSIS — Z1231 Encounter for screening mammogram for malignant neoplasm of breast: Secondary | ICD-10-CM

## 2011-09-07 ENCOUNTER — Encounter: Payer: Self-pay | Admitting: *Deleted

## 2011-10-11 ENCOUNTER — Telehealth: Payer: Self-pay | Admitting: Internal Medicine

## 2011-10-11 ENCOUNTER — Other Ambulatory Visit: Payer: Self-pay | Admitting: *Deleted

## 2011-10-11 MED ORDER — POTASSIUM CHLORIDE ER 10 MEQ PO CPCR: ORAL_CAPSULE | ORAL | Status: DC

## 2011-10-11 NOTE — Telephone Encounter (Signed)
Pharmacist aware to change the 10 mEq capsule to a tablet so pt can cut in half.

## 2011-10-11 NOTE — Telephone Encounter (Signed)
New Msg: Pharmacy calling wanting to speak with MD regarding e-script for potassium chloride. Number 90 tablets however directions state to take 1.5 tablets per day-however pharmacy doesn't make 1/2 tablets. Please return pharmacy call to discuss further.

## 2011-10-12 ENCOUNTER — Other Ambulatory Visit: Payer: Self-pay

## 2011-10-12 MED ORDER — RAMIPRIL 5 MG PO CAPS: 5.0000 mg | ORAL_CAPSULE | Freq: Every day | ORAL | Status: DC

## 2011-10-12 NOTE — Telephone Encounter (Signed)
Received fax refill request from patient's pharmacy.  Prescription refilled. 

## 2011-10-19 ENCOUNTER — Other Ambulatory Visit: Payer: Self-pay | Admitting: *Deleted

## 2011-10-19 MED ORDER — RAMIPRIL 5 MG PO CAPS: 5.0000 mg | ORAL_CAPSULE | Freq: Every day | ORAL | Status: DC

## 2011-10-19 NOTE — Telephone Encounter (Signed)
Patient called to request a refill on Ramipril.  Rx was sent to [facility] on 10/12/2011 but patient stated she does not use [facility] as her pharmacy, she uses [facility].  [facility]

## 2011-10-20 ENCOUNTER — Other Ambulatory Visit: Payer: Self-pay | Admitting: Family Medicine

## 2011-10-25 ENCOUNTER — Other Ambulatory Visit: Payer: Self-pay | Admitting: *Deleted

## 2011-10-25 MED ORDER — SIMVASTATIN 40 MG PO TABS: ORAL_TABLET | ORAL | Status: DC

## 2011-10-25 MED ORDER — POTASSIUM CHLORIDE ER 10 MEQ PO CPCR: ORAL_CAPSULE | ORAL | Status: DC

## 2011-10-25 NOTE — Telephone Encounter (Signed)
Patient called requesting Rx refill, she stated that she gets 90 pills on both Rx's and not 45.  Rx sent to [facility].

## 2011-10-31 ENCOUNTER — Telehealth: Payer: Self-pay | Admitting: *Deleted

## 2011-10-31 NOTE — Telephone Encounter (Signed)
Completed form faxed [phone] and sent to be scanned.

## 2011-10-31 NOTE — Telephone Encounter (Signed)
Done in IN box 

## 2011-10-31 NOTE — Telephone Encounter (Signed)
Fax from [facility] is on your shelf.  They are asking for clarification on potassium script.

## 2011-11-01 ENCOUNTER — Other Ambulatory Visit: Payer: Self-pay | Admitting: *Deleted

## 2011-11-01 MED ORDER — POTASSIUM CHLORIDE ER 10 MEQ PO CPCR: ORAL_CAPSULE | ORAL | Status: DC

## 2011-11-07 ENCOUNTER — Ambulatory Visit (INDEPENDENT_AMBULATORY_CARE_PROVIDER_SITE_OTHER): Payer: Medicare Other | Admitting: *Deleted

## 2011-11-07 ENCOUNTER — Encounter: Payer: Self-pay | Admitting: Internal Medicine

## 2011-11-07 ENCOUNTER — Ambulatory Visit (INDEPENDENT_AMBULATORY_CARE_PROVIDER_SITE_OTHER): Payer: Medicare Other | Admitting: Physician Assistant

## 2011-11-07 ENCOUNTER — Encounter: Payer: Self-pay | Admitting: Physician Assistant

## 2011-11-07 VITALS — BP 112/70 | HR 62 | Wt 200.8 lb

## 2011-11-07 DIAGNOSIS — I472 Ventricular tachycardia, unspecified: Secondary | ICD-10-CM

## 2011-11-07 DIAGNOSIS — R079 Chest pain, unspecified: Secondary | ICD-10-CM

## 2011-11-07 DIAGNOSIS — I428 Other cardiomyopathies: Secondary | ICD-10-CM

## 2011-11-07 LAB — ICD DEVICE OBSERVATION
DEV-0020ICD: NEGATIVE
HV IMPEDENCE: 59 Ohm
RV LEAD AMPLITUDE: 14 mv
TZAT-0001FASTVT: 1
TZAT-0001FASTVT: 2
TZAT-0002FASTVT: NEGATIVE
TZAT-0013FASTVT: 1
TZAT-0018FASTVT: NEGATIVE
TZAT-0019FASTVT: 5 V
TZAT-0020FASTVT: 1 ms
TZON-0004FASTVT: 7
TZON-0005FASTVT: 1
TZST-0001FASTVT: 4
TZST-0001FASTVT: 6
TZST-0001FASTVT: 8
TZST-0003FASTVT: 31 J
TZST-0003FASTVT: 41 J
TZST-0003FASTVT: 41 J
TZST-0003FASTVT: 41 J
VENTRICULAR PACING ICD: 1 pct

## 2011-11-07 NOTE — Progress Notes (Signed)
HPI:  This is a pleasant 61 year old [place] American female patient that has a history of ICD implantation for nonsustained V. 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 She underwent generator replacement in April 2011. She was last seen by Dr. Alberteen Spindle in 07/2011 and was doing well. She also has a history of Takotsubo cardiomyopathy and Sarcoidosis of lungs.  The patient comes in today complaining of a dull ache in her chest that is worse when she lays down. She also has some  soreness in her left neck. The patient does admit to lifting a 75 pound package with her friend before the pain started. She denies any chest tightness, palpitations, dyspnea, dizziness, or presyncope. Her defibrillator was checked today and if stable.  Allergies  Allergen Reactions  . Amitriptyline Hcl     REACTION: ? reaction  . Atorvastatin     REACTION: severe muscle pain  . Cetirizine Hcl     REACTION: sleepy  . Codeine     REACTION: itching  . Paroxetine     REACTION: ? reaction  . Rosuvastatin     REACTION: severe muscle pain  . Carbamazepine Rash    REACTION: ? reaction    Current Outpatient Prescriptions on File Prior to Visit  Medication Sig Dispense Refill  . acetaminophen (TYLENOL) 650 MG CR tablet Take 650 mg by mouth every 8 (eight) hours as needed.        [name] aspirin 81 MG tablet Take 81 mg by mouth daily.        [name] azelastine (ASTELIN) 137 MCG/SPRAY nasal spray Place 2 sprays into the nose 2 (two) times daily. Use in each nostril as directed  30 mL  6  . bimatoprost (LUMIGAN) 0.03 % ophthalmic solution 1 drop as directed.        . hydrochlorothiazide 25 MG tablet Take 1 tablet (25 mg total) by mouth daily.  45 tablet  3  . ibuprofen (ADVIL,MOTRIN) 200 MG tablet Take 200 mg by mouth every 6 (six) hours as needed.        [name] KLOR-CON 10 10 MEQ tablet TAKE 1 TABLET EVERY OTHER  DAY  45 each  0  . levothyroxine (SYNTHROID, LEVOTHROID) 50 MCG tablet Take 1 tablet (50 mcg total) by mouth daily.   30 tablet  11  . metoprolol tartrate (LOPRESSOR) 25 MG tablet Take 1 tablet (25 mg total) by mouth 2 (two) times daily.  180 tablet  3  . naproxen sodium (ANAPROX) 220 MG tablet Take 220 mg by mouth 2 (two) times daily with a meal.        . polyethylene glycol (MIRALAX / GLYCOLAX) packet Take 17 g by mouth 4 (four) times daily.        . potassium chloride (MICRO-K) 10 MEQ CR capsule Take 1/2 half capsule daily  90 capsule  3  . ramipril (ALTACE) 5 MG capsule Take 1 capsule (5 mg total) by mouth daily.  90 capsule  3  . simvastatin (ZOCOR) 40 MG tablet Take one half tablet by mouth daily  90 tablet  3  . traMADol (ULTRAM) 50 MG tablet Take 50 mg by mouth every 8 (eight) hours as needed.        . vitamin C (ASCORBIC ACID) 500 MG tablet Take 500 mg by mouth daily.        [name] VITAMIN D, CHOLECALCIFEROL, PO Take 1 tablet by mouth daily.        [name] zolpidem (AMBIEN) 10 MG tablet Take  1 tablet (10 mg total) by mouth at bedtime as needed.  90 tablet  1    Past Medical History  Diagnosis Date  . Automatic implantable cardiac defibrillator in situ   . Paroxysmal ventricular tachycardia   . Ostium secundum type atrial septal defect   . Unspecified transient cerebral ischemia   . Takotsubo syndrome   . Otalgia, unspecified   . Other malaise and fatigue   . Unspecified vitamin D deficiency   . Headache   . Tachycardia, unspecified   . Other pulmonary embolism and infarction   . Mitral valve disorders   . Other chronic nonalcoholic liver disease   . Myalgia and myositis, unspecified   . Cerebral aneurysm, nonruptured   . Unspecified hypothyroidism   . Unspecified essential hypertension   . Other and unspecified hyperlipidemia   . Esophageal reflux   . Diverticulosis of colon (without mention of hemorrhage)   . Depressive disorder, not elsewhere classified   . Allergic rhinitis, cause unspecified   . Enlargement of lymph nodes   . Sarcoidosis   . Obstructive sleep apnea (adult) (pediatric)   .  GI bleed   . PUD (peptic ulcer disease)     Past Surgical History  Procedure Date  . Partial hysterectomy     Fibroids  . Cerebral aneurysm repair 02/1999  . Knee arthroscopy     bilateral  . Cardiac defibrillator placement     Guidant  . Adenosine cardiolite 2003    neg, EF 50%  . Carotid dopplers 2006    neg  . Hospitalization 2004    GI bleed, PUD, diverticulosis (EGD,colonscopy)  . Hospitalization     PE, NSVT, s/p defib  . Abi 2006    normal    Family History  Problem Relation Age of Onset  . Diabetes Mother   . Diabetes Brother   . Cardiomyopathy      Family history  . Other Brother     Thyroid problem  . Allergies Sister   . Heart disease Sister   . Heart disease Brother   . Heart disease Father   . Colon cancer Maternal Grandmother   . Lung cancer Maternal Grandfather     History   Social History  . Marital Status: Divorced    Spouse Name: N/A    Number of Children: 2  . Years of Education: N/A   Occupational History  . Retired    Social History Main Topics  . Smoking status: Former Smoker    Quit date: 08/21/1991  . Smokeless tobacco: Not on file   Comment: Started at 14; less than 1 PPD  . Alcohol Use: Yes     Rare  . Drug Use: No  . Sexually Active: Not on file   Other Topics Concern  . Not on file   Social History Narrative   Retired from [employer] aloneDivorced    ZOX:WRUEAV trouble with indigestion and belching, see history of present illness otherwise negative   PHYSICAL EXAM: Well-nournished, in no acute distress. Neck: No JVD, HJR, Bruit, or thyroid enlargement Lungs: No tachypnea, clear without wheezing, rales, or rhonchi Cardiovascular:tender in the left breast region,defibrillator and left upper chest is stable, RRR, PMI not displaced, heart sounds normal, no murmurs, gallops, bruit, thrill, or heave. Abdomen: BS normal. Soft without organomegaly, masses, lesions or tenderness. Extremities: without  cyanosis, clubbing or edema. Good distal pulses bilateral SKin: Warm, no lesions or rashes  Musculoskeletal: No deformities Neuro: no focal signs  BP 112/70  Pulse 62  Wt 200 lb 12.8 oz (91.082 kg)   ZOX:WRUEAV sinus rhythm normal EKG  2Decho:04/25/11 Impressions:  - Normal LV size and systolic function, EF 55%. Biatrial   enlargement. Normal RV size and systolic function. Mild to   moderate tricuspid regurgitation with ICD wire in RV.

## 2011-11-07 NOTE — Assessment & Plan Note (Signed)
Patient has an ICD. This was checked today and is functioning normally.

## 2011-11-07 NOTE — Progress Notes (Signed)
ICD check 

## 2011-11-07 NOTE — Assessment & Plan Note (Signed)
Patient has chest pain it seems to be musculoskeletal related. She did help lift a 75 pound package before the pain started. She is tender to touch in her chest and left breast region. She has prescriptions for tramadol and also takes Advil and Tylenol for arthritis p.r.n. I asked her to take some Advil for the pain.I also advised her not to take too much of these 3 drugs together.

## 2011-11-07 NOTE — Patient Instructions (Addendum)
  Your physician recommends that you schedule a follow-up appointment in: 3 months with Belenda Cruise or Gunnar Fusi in the device clinic.     Continue current medications

## 2011-11-15 ENCOUNTER — Other Ambulatory Visit: Payer: Self-pay

## 2011-11-15 NOTE — Telephone Encounter (Signed)
Pt said she needs 30 day rx for Zolpidem [facility] [address]. Pt said has enough med to last until first of next week. Pt also request written rx for 90 day supply for Synthroid 0.05mg  and Zolpidem 10 mg #90 to send to mail order pharmacy. Pt already scheduled CPX but could not get appt until 01/11/12.Pt saw Dr Para March 03/30/11 for S/T and last saw Dr Milinda Antis for CPX on 09/15/10. Pt can be reached at [phone].Please advise. Pt understands Dr Milinda Antis is out of office until Monday.

## 2011-11-19 MED ORDER — LEVOTHYROXINE SODIUM 50 MCG PO TABS: 50.0000 ug | ORAL_TABLET | Freq: Every day | ORAL | Status: DC

## 2011-11-19 MED ORDER — ZOLPIDEM TARTRATE 10 MG PO TABS: 10.0000 mg | ORAL_TABLET | Freq: Every evening | ORAL | Status: DC | PRN

## 2011-11-19 NOTE — Telephone Encounter (Signed)
Px written for call in  [facility] I will also print the 90 day px for her mail order in IN box  thanks

## 2011-11-19 NOTE — Telephone Encounter (Signed)
[facility] patient advised, other Rx's left at front desk for pick up.

## 2012-01-02 ENCOUNTER — Other Ambulatory Visit: Payer: Self-pay | Admitting: Otolaryngology

## 2012-01-02 ENCOUNTER — Ambulatory Visit
Admission: RE | Admit: 2012-01-02 | Discharge: 2012-01-02 | Disposition: A | Discharge status: Home / Self Care | Payer: Medicare Other | Source: Ambulatory Visit | Attending: Otolaryngology | Admitting: Otolaryngology

## 2012-01-02 DIAGNOSIS — H93299 Other abnormal auditory perceptions, unspecified ear: Secondary | ICD-10-CM

## 2012-01-03 ENCOUNTER — Other Ambulatory Visit (INDEPENDENT_AMBULATORY_CARE_PROVIDER_SITE_OTHER): Payer: Medicare Other

## 2012-01-03 DIAGNOSIS — E559 Vitamin D deficiency, unspecified: Secondary | ICD-10-CM

## 2012-01-03 DIAGNOSIS — E039 Hypothyroidism, unspecified: Secondary | ICD-10-CM

## 2012-01-03 DIAGNOSIS — K219 Gastro-esophageal reflux disease without esophagitis: Secondary | ICD-10-CM

## 2012-01-03 DIAGNOSIS — I10 Essential (primary) hypertension: Secondary | ICD-10-CM

## 2012-01-03 DIAGNOSIS — K7689 Other specified diseases of liver: Secondary | ICD-10-CM

## 2012-01-03 DIAGNOSIS — E785 Hyperlipidemia, unspecified: Secondary | ICD-10-CM

## 2012-01-03 LAB — CBC WITH DIFFERENTIAL/PLATELET
Basophils Absolute: 0 10*3/uL (ref 0.0–0.1)
HCT: 38 % (ref 36.0–46.0)
Lymphs Abs: 1.1 10*3/uL (ref 0.7–4.0)
Monocytes Absolute: 0.4 10*3/uL (ref 0.1–1.0)
Monocytes Relative: 8.7 % (ref 3.0–12.0)
Neutrophils Relative %: 60.1 % (ref 43.0–77.0)
Platelets: 200 10*3/uL (ref 150.0–400.0)
RDW: 13.1 % (ref 11.5–14.6)

## 2012-01-03 LAB — TSH: TSH: 1.62 u[IU]/mL (ref 0.35–5.50)

## 2012-01-03 LAB — LIPID PANEL
LDL Cholesterol: 80 mg/dL (ref 0–99)
Total CHOL/HDL Ratio: 3
VLDL: 23 mg/dL (ref 0.0–40.0)

## 2012-01-03 LAB — COMPREHENSIVE METABOLIC PANEL
ALT: 32 U/L (ref 0–35)
Alkaline Phosphatase: 98 U/L (ref 39–117)
Sodium: 139 mEq/L (ref 135–145)
Total Bilirubin: 0.3 mg/dL (ref 0.3–1.2)
Total Protein: 7.3 g/dL (ref 6.0–8.3)

## 2012-01-04 LAB — VITAMIN D 25 HYDROXY (VIT D DEFICIENCY, FRACTURES): Vit D, 25-Hydroxy: 39 ng/mL (ref 30–89)

## 2012-01-11 ENCOUNTER — Encounter: Payer: Self-pay | Admitting: Family Medicine

## 2012-01-11 ENCOUNTER — Ambulatory Visit (INDEPENDENT_AMBULATORY_CARE_PROVIDER_SITE_OTHER): Payer: Medicare Other | Admitting: Family Medicine

## 2012-01-11 VITALS — BP 102/70 | HR 64 | Temp 98.8°F | Ht 66.5 in | Wt 202.5 lb

## 2012-01-11 DIAGNOSIS — Z1211 Encounter for screening for malignant neoplasm of colon: Secondary | ICD-10-CM

## 2012-01-11 DIAGNOSIS — Z780 Asymptomatic menopausal state: Secondary | ICD-10-CM | POA: Insufficient documentation

## 2012-01-11 DIAGNOSIS — N649 Disorder of breast, unspecified: Secondary | ICD-10-CM

## 2012-01-11 DIAGNOSIS — L988 Other specified disorders of the skin and subcutaneous tissue: Secondary | ICD-10-CM

## 2012-01-11 DIAGNOSIS — E039 Hypothyroidism, unspecified: Secondary | ICD-10-CM

## 2012-01-11 DIAGNOSIS — E559 Vitamin D deficiency, unspecified: Secondary | ICD-10-CM

## 2012-01-11 DIAGNOSIS — E785 Hyperlipidemia, unspecified: Secondary | ICD-10-CM

## 2012-01-11 DIAGNOSIS — I10 Essential (primary) hypertension: Secondary | ICD-10-CM

## 2012-01-11 MED ORDER — POTASSIUM CHLORIDE ER 10 MEQ PO TBCR: 10.0000 meq | EXTENDED_RELEASE_TABLET | ORAL | Status: DC

## 2012-01-11 MED ORDER — SIMVASTATIN 40 MG PO TABS: ORAL_TABLET | ORAL | Status: DC

## 2012-01-11 MED ORDER — LEVOTHYROXINE SODIUM 50 MCG PO TABS: 50.0000 ug | ORAL_TABLET | Freq: Every day | ORAL | Status: DC

## 2012-01-11 MED ORDER — HYDROCHLOROTHIAZIDE 25 MG PO TABS: 12.5000 mg | ORAL_TABLET | Freq: Every day | ORAL | Status: DC

## 2012-01-11 MED ORDER — ZOLPIDEM TARTRATE 10 MG PO TABS: 10.0000 mg | ORAL_TABLET | Freq: Every evening | ORAL | Status: DC | PRN

## 2012-01-11 MED ORDER — AZELASTINE HCL 0.1 % NA SOLN: 2.0000 | Freq: Two times a day (BID) | NASAL | Status: DC

## 2012-01-11 MED ORDER — RAMIPRIL 5 MG PO CAPS: 5.0000 mg | ORAL_CAPSULE | Freq: Every day | ORAL | Status: DC

## 2012-01-11 NOTE — Progress Notes (Signed)
Subjective:    Patient ID: [name], female    DOB: 1951/07/06, 61 y.o.   MRN: [id number]  HPI Here for check up of chronic medical conditions and to review health mt list   Feeling pretty good overall , but stays stiff all over  OA in knee  Sees rheum and going to ortho  Also bruises easily    Had hysterect for fibroids in past - partial No problems    mammo 1/13 normal Self exam - no breast lumps but has a place on her breast -- like a rash/ itchy- wants to check that out    Colon cancer screening - thinks she needs for screening  A lot of constipation - then miralax causes diarrhea  Is interested in colon cancer screening  Had GI bleed years ago -never knew why-was in hospital   Zoster status  Had the vaccine in jan - her insurance covered    bp is   102/70  Today No cp or palpitations or headaches or edema  No side effects to medicines    Did really well with exercise for a while -then had to move and got off track Will be starting the silver sneakers program - and water aerobics   Wt is up 2 lb with bmi of 32  Hx of vit D def- level 39 on current suppl- just started back on her vit D  Also outdoors more  dexa-never had one   Lipids - on zocor Lab Results  Component Value Date   CHOL 164 01/03/2012   CHOL 157 09/11/2010   CHOL 164 08/24/2009   Lab Results  Component Value Date   HDL 60.60 01/03/2012   HDL 86.57 09/11/2010   HDL 84.69 08/24/2009   Lab Results  Component Value Date   LDLCALC 80 01/03/2012   LDLCALC 89 09/11/2010   LDLCALC 91 08/24/2009   Lab Results  Component Value Date   TRIG 115.0 01/03/2012   TRIG 128.0 09/11/2010   TRIG 110.0 08/24/2009   Lab Results  Component Value Date   CHOLHDL 3 01/03/2012   CHOLHDL 4 09/11/2010   CHOLHDL 3 08/24/2009   Lab Results  Component Value Date   LDLDIRECT 141.8 07/04/2007   good control  Is eating very healthy diet -- working hard on that , lot of salads , no fried foods  No longer on  coumadin-just low dose asa  Lab Results  Component Value Date   WBC 4.4* 01/03/2012   HGB 12.2 01/03/2012   HCT 38.0 01/03/2012   MCV 90.5 01/03/2012   PLT 200.0 01/03/2012    Hypothyroid Lab Results  Component Value Date   TSH 1.62 01/03/2012   clinically  Patient Active Problem List  Diagnoses  . PULMONARY SARCOIDOSIS  . HYPOTHYROIDISM  . UNSPECIFIED VITAMIN D DEFICIENCY  . HYPERLIPIDEMIA  . DEPRESSION  . SLEEP APNEA, OBSTRUCTIVE  . HYPERTENSION  . PULMONARY EMBOLISM  . MITRAL VALVE PROLAPSE  . VENTRICULAR TACHYCARDIA  . TAKOTSUBO SYNDROME  . TIA  . CEREBRAL ANEURYSM  . ALLERGIC RHINITIS  . GERD  . DIVERTICULOSIS, COLON  . FATTY LIVER DISEASE  . FIBROMYALGIA  . PATENT FORAMEN OVALE  . MEDIASTINAL LYMPHADENOPATHY  . ICD  [employer]  Single chamber  . Sore throat  . Abnormal tympanic membrane  . Numbness of toes  . Palpitations  . Chest pain  . Colon cancer screening  . Post-menopausal  . Skin lesion of breast   Past Medical History  Diagnosis Date  . Automatic implantable cardiac defibrillator in situ   . Ventricular tachycardia, inducible   . Ostium secundum type atrial septal defect   . Unspecified transient cerebral ischemia   . Takotsubo syndrome   . Otalgia, unspecified   . Other malaise and fatigue   . Unspecified vitamin D deficiency   . Headache   . Tachycardia, unspecified   . Other pulmonary embolism and infarction   . Mitral valve disorders   . Other chronic nonalcoholic liver disease   . Myalgia and myositis, unspecified   . Cerebral aneurysm, nonruptured   . Unspecified hypothyroidism   . Unspecified essential hypertension   . Other and unspecified hyperlipidemia   . Esophageal reflux   . Diverticulosis of colon (without mention of hemorrhage)   . Depressive disorder, not elsewhere classified   . Allergic rhinitis, cause unspecified   . Enlargement of lymph nodes   . Sarcoidosis   . Obstructive sleep apnea (adult) (pediatric)     . GI bleed   . PUD (peptic ulcer disease)    Past Surgical History  Procedure Date  . Partial hysterectomy     Fibroids  . Cerebral aneurysm repair 02/1999  . Knee arthroscopy     bilateral  . Cardiac defibrillator placement     Guidant  . Adenosine cardiolite 2003    neg, EF 50%  . Carotid dopplers 2006    neg  . Hospitalization 2004    GI bleed, PUD, diverticulosis (EGD,colonscopy)  . Hospitalization     PE, NSVT, s/p defib  . Abi 2006    normal   History  Substance Use Topics  . Smoking status: Former Smoker    Quit date: 08/21/1991  . Smokeless tobacco: Not on file   Comment: Started at 14; less than 1 PPD  . Alcohol Use: Yes     Rare   Family History  Problem Relation Age of Onset  . Diabetes Mother   . Diabetes Brother   . Cardiomyopathy      Family history  . Other Brother     Thyroid problem  . Allergies Sister   . Heart disease Sister   . Heart disease Brother   . Heart disease Father   . Colon cancer Maternal Grandmother   . Lung cancer Maternal Grandfather    Allergies  Allergen Reactions  . Amitriptyline Hcl     REACTION: ? reaction  . Atorvastatin     REACTION: severe muscle pain  . Cetirizine Hcl     REACTION: sleepy  . Codeine     REACTION: itching  . Paroxetine     REACTION: ? reaction  . Rosuvastatin     REACTION: severe muscle pain  . Carbamazepine Rash    REACTION: ? reaction   Current Outpatient Prescriptions on File Prior to Visit  Medication Sig Dispense Refill  . acetaminophen (TYLENOL) 650 MG CR tablet Take 650 mg by mouth every 8 (eight) hours as needed.        [name] aspirin 81 MG tablet Take 81 mg by mouth daily.        [name] azelastine (ASTELIN) 137 MCG/SPRAY nasal spray Place 2 sprays into the nose 2 (two) times daily. Use in each nostril as directed  90 mL  3  . hydrochlorothiazide (HYDRODIURIL) 25 MG tablet Take 0.5 tablets (12.5 mg total) by mouth daily.  45 tablet  3  . levothyroxine (SYNTHROID, LEVOTHROID) 50 MCG tablet  Take 1 tablet (50 mcg total) by  mouth daily.  90 tablet  3  . metoprolol tartrate (LOPRESSOR) 25 MG tablet Take 1 tablet (25 mg total) by mouth 2 (two) times daily.  180 tablet  3  . polyethylene glycol (MIRALAX / GLYCOLAX) packet Take 17 g by mouth 4 (four) times daily.        . potassium chloride (KLOR-CON 10) 10 MEQ tablet Take 1 tablet (10 mEq total) by mouth every other day.  45 tablet  3  . ramipril (ALTACE) 5 MG capsule Take 1 capsule (5 mg total) by mouth daily.  90 capsule  3  . simvastatin (ZOCOR) 40 MG tablet Take one half tablet by mouth daily  45 tablet  3  . traMADol (ULTRAM) 50 MG tablet Take 50 mg by mouth every 8 (eight) hours as needed.        . vitamin C (ASCORBIC ACID) 500 MG tablet Take 500 mg by mouth daily.        [name] VITAMIN D, CHOLECALCIFEROL, PO Take 1 tablet by mouth daily.        [name] zolpidem (AMBIEN) 10 MG tablet Take 1 tablet (10 mg total) by mouth at bedtime as needed.  90 tablet  0     Review of Systems Review of Systems  Constitutional: Negative for fever, appetite change, fatigue and unexpected weight change.  Eyes: Negative for pain and visual disturbance.  Respiratory: Negative for cough and shortness of breath.   Cardiovascular: Negative for cp or palpitations    Gastrointestinal: Negative for nausea, diarrhea and constipation.  Genitourinary: Negative for urgency and frequency.  Skin: Negative for pallor or rash   Neurological: Negative for weakness, light-headedness, numbness and headaches.  Hematological: Negative for adenopathy. Does not bruise/bleed easily.  Psychiatric/Behavioral: Negative for dysphoric mood. The patient is not nervous/anxious.         Objective:   Physical Exam  Constitutional: She appears well-developed and well-nourished. No distress.       Obese and well appearing   HENT:  Head: Normocephalic and atraumatic.  Right Ear: External ear normal.  Left Ear: External ear normal.  Nose: Nose normal.  Mouth/Throat: Oropharynx is  clear and moist. No oropharyngeal exudate.  Eyes: Conjunctivae and EOM are normal. Pupils are equal, round, and reactive to light. Right eye exhibits no discharge. Left eye exhibits no discharge. No scleral icterus.  Neck: Normal range of motion. Neck supple. No JVD present. Carotid bruit is not present. No thyromegaly present.  Cardiovascular: Normal rate, regular rhythm and intact distal pulses.  Exam reveals no gallop.   Pulmonary/Chest: Effort normal and breath sounds normal. No respiratory distress. She has no wheezes.       Few dry sounding crackles at bases   Abdominal: Soft. Bowel sounds are normal. She exhibits no distension, no abdominal bruit and no mass. There is no tenderness.  Genitourinary: No breast swelling, tenderness, discharge or bleeding.       Breast exam: No mass, nodules, thickening, tenderness, bulging, retraction, inflamation, nipple discharge or skin changes noted.  No axillary or clavicular LA.  Chaperoned exam.   L lower breast - large oval nevus that resembles SK- is hyperpigmented but not rough   Musculoskeletal: Normal range of motion. She exhibits no edema and no tenderness.  Lymphadenopathy:    She has no cervical adenopathy.  Neurological: She is alert. She has normal reflexes. No cranial nerve deficit. She exhibits normal muscle tone. Coordination normal.  Skin: Skin is warm and dry. No rash noted. No erythema.  No pallor.  Psychiatric: She has a normal mood and affect.          Assessment & Plan:

## 2012-01-11 NOTE — Patient Instructions (Addendum)
We will do referral to dermatology and GI at check out  Also will refer for bone density test  Keep working on healthy diet and exercise  I sent your hctz to [facility]

## 2012-01-14 NOTE — Assessment & Plan Note (Signed)
theraputic tsh with no symptoms/ skin or hair changes

## 2012-01-14 NOTE — Assessment & Plan Note (Signed)
This looks like SK or birthmark- but in light of size and newness- ref to derm for eval

## 2012-01-14 NOTE — Assessment & Plan Note (Signed)
bp in fair control at this time  No changes needed  Disc lifstyle change with low sodium diet and exercise   

## 2012-01-14 NOTE — Assessment & Plan Note (Signed)
With thyroid supplementation- disc planning dexa  Rev ca and vit D needs and exercise

## 2012-01-14 NOTE — Assessment & Plan Note (Signed)
Ref to GI to disc Remote hx of GI bleed

## 2012-01-14 NOTE — Assessment & Plan Note (Signed)
Simvastatin and diet -no problems Disc goals for lipids and reasons to control them Rev labs with pt Rev low sat fat diet in detail

## 2012-01-14 NOTE — Assessment & Plan Note (Signed)
Supplementation currently has level to goal  Disc imp to bone and overall health Also plan dexa

## 2012-01-18 ENCOUNTER — Other Ambulatory Visit: Payer: Self-pay | Admitting: Internal Medicine

## 2012-01-18 ENCOUNTER — Other Ambulatory Visit: Payer: Self-pay | Admitting: Cardiology

## 2012-01-18 DIAGNOSIS — I472 Ventricular tachycardia: Secondary | ICD-10-CM

## 2012-01-18 DIAGNOSIS — I10 Essential (primary) hypertension: Secondary | ICD-10-CM

## 2012-01-18 MED ORDER — METOPROLOL TARTRATE 25 MG PO TABS: 25.0000 mg | ORAL_TABLET | Freq: Two times a day (BID) | ORAL | Status: DC

## 2012-01-18 MED ORDER — HYDROCHLOROTHIAZIDE 25 MG PO TABS: 12.5000 mg | ORAL_TABLET | Freq: Every day | ORAL | Status: DC

## 2012-01-18 NOTE — Telephone Encounter (Signed)
Refill - Hydrochlorothiazide (HYDRODIURIL) 25 MG tablet          - Metoprolol tartrate (LOPRESSOR) 25 MG tablet  [facility] [address]  For temp supply for both medication, ALSO  PLEASE SEND [facility] DAY SUPPLY [facility] .

## 2012-01-21 ENCOUNTER — Other Ambulatory Visit: Payer: Self-pay | Admitting: *Deleted

## 2012-01-21 DIAGNOSIS — I472 Ventricular tachycardia: Secondary | ICD-10-CM

## 2012-01-21 MED ORDER — METOPROLOL TARTRATE 25 MG PO TABS: 25.0000 mg | ORAL_TABLET | Freq: Two times a day (BID) | ORAL | Status: DC

## 2012-01-28 ENCOUNTER — Encounter: Payer: Self-pay | Admitting: Family Medicine

## 2012-01-31 ENCOUNTER — Ambulatory Visit (INDEPENDENT_AMBULATORY_CARE_PROVIDER_SITE_OTHER)
Admission: RE | Admit: 2012-01-31 | Discharge: 2012-01-31 | Disposition: A | Discharge status: Home / Self Care | Payer: Medicare Other | Source: Ambulatory Visit | Attending: Family Medicine | Admitting: Family Medicine

## 2012-01-31 DIAGNOSIS — Z780 Asymptomatic menopausal state: Secondary | ICD-10-CM

## 2012-01-31 DIAGNOSIS — E559 Vitamin D deficiency, unspecified: Secondary | ICD-10-CM

## 2012-02-06 ENCOUNTER — Ambulatory Visit (INDEPENDENT_AMBULATORY_CARE_PROVIDER_SITE_OTHER): Payer: Medicare Other | Admitting: Cardiology

## 2012-02-06 ENCOUNTER — Other Ambulatory Visit: Payer: Self-pay

## 2012-02-06 ENCOUNTER — Encounter: Payer: Self-pay | Admitting: Cardiology

## 2012-02-06 VITALS — BP 115/80 | HR 68 | Ht 66.0 in | Wt 200.0 lb

## 2012-02-06 DIAGNOSIS — I472 Ventricular tachycardia: Secondary | ICD-10-CM

## 2012-02-06 DIAGNOSIS — R531 Weakness: Secondary | ICD-10-CM

## 2012-02-06 DIAGNOSIS — R5383 Other fatigue: Secondary | ICD-10-CM

## 2012-02-06 DIAGNOSIS — I4729 Other ventricular tachycardia: Secondary | ICD-10-CM

## 2012-02-06 DIAGNOSIS — Z95810 Presence of automatic (implantable) cardiac defibrillator: Secondary | ICD-10-CM

## 2012-02-06 LAB — ICD DEVICE OBSERVATION
HV IMPEDENCE: 54 Ohm
RV LEAD THRESHOLD: 0.8 V
TZAT-0001FASTVT: 2
TZAT-0013FASTVT: 1
TZAT-0018FASTVT: NEGATIVE
TZAT-0020FASTVT: 1 ms
TZON-0004FASTVT: 7
TZON-0005FASTVT: 1
TZST-0001FASTVT: 4
TZST-0001FASTVT: 5
TZST-0001FASTVT: 6
TZST-0001FASTVT: 8
TZST-0003FASTVT: 31 J
TZST-0003FASTVT: 41 J
TZST-0003FASTVT: 41 J
VENTRICULAR PACING ICD: 0 pct

## 2012-02-06 LAB — BASIC METABOLIC PANEL
CO2: 29 mEq/L (ref 19–32)
Calcium: 9.5 mg/dL (ref 8.4–10.5)
Creatinine, Ser: 0.8 mg/dL (ref 0.4–1.2)
GFR: 98.74 mL/min (ref >60.00)
Sodium: 139 mEq/L (ref 135–145)

## 2012-02-06 NOTE — Progress Notes (Signed)
ELECTROPHYSIOLOGY OFFICE NOTE  Patient ID: [name] MRN: [id number], DOB/AGE: 1951-07-02   Date of Visit: 02/06/2012  Primary Physician: Roxy Manns, MD Primary Cardiologist: Berton Mount, MD Reason for Visit: Weakness, device follow-up  History of Present Illness [name] is a pleasant 61 year old woman with sarcoidosis, paroxysmal VT s/p single chamber ICD, nonobstructive CAD s/p LHC in 2005 and prior Takotsubo cardiomyopathy who presents today for device follow-up. She is experiencing persistent, generalized weakness. This started on Friday and has been constant, waxing and waning with activity. She reports racing palpitations with her weakness which have been intermittent but are occurring daily. She denies chest pain or shortness of breath. She denies near-syncope or syncope. She reports having her annual physical with labs, including a TSH, at her PCP's office in May and tells me "everything was fine." She denies any recent illness, fever or chills. She denies any recent changes to her medications and reports she is compliant.  Past Medical History  Diagnosis Date  . Automatic implantable cardiac defibrillator in situ   . Paroxysmal ventricular tachycardia   . Ostium secundum type atrial septal defect   . Unspecified transient cerebral ischemia   . Takotsubo syndrome   . Otalgia, unspecified   . Other malaise and fatigue   . Unspecified vitamin D deficiency   . Headache   . Tachycardia, unspecified   . Other pulmonary embolism and infarction   . Mitral valve disorders   . Other chronic nonalcoholic liver disease   . Myalgia and myositis, unspecified   . Cerebral aneurysm, nonruptured   . Unspecified hypothyroidism   . Unspecified essential hypertension   . Other and unspecified hyperlipidemia   . Esophageal reflux   . Diverticulosis of colon (without mention of hemorrhage)   . Depressive disorder, not elsewhere classified   . Allergic rhinitis, cause  unspecified   . Enlargement of lymph nodes   . Sarcoidosis   . Obstructive sleep apnea (adult) (pediatric)   . GI bleed   . PUD (peptic ulcer disease)     Past Surgical History  Procedure Date  . Partial hysterectomy     Fibroids  . Cerebral aneurysm repair 02/1999  . Knee arthroscopy     bilateral  . Cardiac defibrillator placement     Guidant  . Adenosine cardiolite 2003    neg, EF 50%  . Carotid dopplers 2006    neg  . Hospitalization 2004    GI bleed, PUD, diverticulosis (EGD,colonscopy)  . Hospitalization     PE, NSVT, s/p defib  . Abi 2006    normal    Allergies/Intolerances Allergen Reactions  . Amitriptyline Hcl   . Atorvastatin   . Cetirizine Hcl   . Codeine   . Paroxetine   . Rosuvastatin   . Carbamazepine Rash   Current Home Medications Medication Sig Dispense Refill  . acetaminophen (TYLENOL) 650 MG CR tablet Take 650 mg by mouth every 8 (eight) hours as needed.        [name] aspirin 81 MG tablet Take 81 mg by mouth daily.        [name] azelastine (ASTELIN) 137 MCG/SPRAY nasal spray Place 2 sprays into the nose 2 (two) times daily. Use in each nostril as directed  90 mL  3  . diclofenac sodium (VOLTAREN) 1 % GEL Apply 1 application topically as directed.      . hydrochlorothiazide (HYDRODIURIL) 25 MG tablet Take 0.5 tablets (12.5 mg total) by mouth daily.  45 tablet  3  . levothyroxine (SYNTHROID, LEVOTHROID) 50 MCG tablet Take 1 tablet (50 mcg total) by mouth daily.  90 tablet  3  . metoprolol tartrate (LOPRESSOR) 25 MG tablet Take 1 tablet (25 mg total) by mouth 2 (two) times daily.  30 tablet  0  . polyethylene glycol (MIRALAX / GLYCOLAX) packet Take 17 g by mouth 4 (four) times daily.        . potassium chloride (K-DUR) 10 MEQ tablet 1/2 tab po qd      . ramipril (ALTACE) 5 MG capsule Take 1 capsule (5 mg total) by mouth daily.  90 capsule  3  . simvastatin (ZOCOR) 40 MG tablet Take one half tablet by mouth daily  45 tablet  3  . traMADol (ULTRAM) 50 MG tablet  Take 50 mg by mouth every 8 (eight) hours as needed.        [name] VITAMIN D, CHOLECALCIFEROL, PO Take 1 tablet by mouth daily.        [name] zolpidem (AMBIEN) 10 MG tablet Take 1 tablet (10 mg total) by mouth at bedtime as needed.  90 tablet  0  . DISCONTD: potassium chloride (KLOR-CON 10) 10 MEQ tablet Take 1 tablet (10 mEq total) by mouth every other day.  45 tablet  3   Social History Social History  . Marital Status: Divorced    Spouse Name: N/A    Number of Children: 2  . Years of Education: N/A   Occupational History  . Retired    Social History Main Topics  . Smoking status: Former Smoker    Quit date: 08/21/1991  . Smokeless tobacco: Not on file   Comment: Started at 14; less than 1 PPD  . Alcohol Use: Yes     Rare  . Drug Use: No  . Sexually Active: Not on file   Social History Narrative   Retired from [employer] 2 children Lives alone Divorced    Review of Systems General:  No chills, fever, night sweats or weight changes Cardiovascular:  No chest pain, dyspnea on exertion, edema, orthopnea, paroxysmal nocturnal dyspnea Dermatological: No rash, lesions or masses Respiratory: No cough, dyspnea Urologic: No hematuria, dysuria Abdominal:   No nausea, vomiting, diarrhea, bright red blood per rectum, melena, or hematemesis Neurologic:  No visual changes or changes in mental status All other systems reviewed and are otherwise negative except as noted above.  Physical Exam Blood pressure 115/80, pulse 68, height 5\' 6"  (1.676 m), weight 200 lb (90.719 kg).  General: Well developed, well appearing 61 year old female in no acute distress. HEENT: Normocephalic, atraumatic. EOMs intact. Sclera nonicteric. Oropharynx clear.  Neck: Supple without bruits. No JVD. Lungs: Respirations regular and unlabored, CTA bilaterally. No wheezes, rales or rhonchi. Heart: RRR. S1, S2 present. No murmurs, rub, S3 or S4. Abdomen: Soft, non-distended.  Extremities: No clubbing, cyanosis or edema.  DP/PT/Radials 2+ and equal bilaterally. Psych: Normal affect. Neuro: Alert and oriented X 3. Moves all extremities spontaneously.   Diagnostics Echocardiogram from September 2012 - normal LV size and systolic function, EF 55%; normal RV size and function Device interrogation today shows normal ICD function with stable lead parameters; no tachy episodes; added a VT monitor zone at 170 bpm; no other programming changes made; see [facility] report  Assessment and Plan 1. Weakness - ? etiology; she reports intermittent racing palpitations; there were no tachy episodes documented by device interrogation today but she has only a single chamber device; will order a 48 hr Holter  monitor, BMP and echocardiogram 2. Paroxysmal VT s/p ICD implant - normal device function today; no episodes documented; added a VT monitor zone at 170 bpm, otherwise no changes made [name] will return to clinic in 2 weeks unless needed sooner. She expressed verbal understanding and agrees with this plan of care.  Signed, Rick Duff, PA-C 02/06/2012, 1:48 PM

## 2012-02-06 NOTE — Patient Instructions (Addendum)
Your physician has recommended that you wear a holter monitor. Holter monitors are medical devices that record the heart's electrical activity. Doctors most often use these monitors to diagnose arrhythmias. Arrhythmias are problems with the speed or rhythm of the heartbeat. The monitor is a small, portable device. You can wear one while you do your normal daily activities. This is usually used to diagnose what is causing palpitations/syncope (passing out).  Your physician has requested that you have an echocardiogram. Echocardiography is a painless test that uses sound waves to create images of your heart. It provides your doctor with information about the size and shape of your heart and how well your heart's chambers and valves are working. This procedure takes approximately one hour. There are no restrictions for this procedure.  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

## 2012-02-07 ENCOUNTER — Ambulatory Visit: Payer: Medicare Other | Admitting: Cardiology

## 2012-02-08 ENCOUNTER — Ambulatory Visit: Payer: Medicare Other | Admitting: Nurse Practitioner

## 2012-02-14 ENCOUNTER — Encounter (INDEPENDENT_AMBULATORY_CARE_PROVIDER_SITE_OTHER): Payer: Medicare Other

## 2012-02-14 ENCOUNTER — Ambulatory Visit (HOSPITAL_COMMUNITY): Payer: Medicare Other | Attending: Internal Medicine | Admitting: Radiology

## 2012-02-14 DIAGNOSIS — R000 Tachycardia, unspecified: Secondary | ICD-10-CM

## 2012-02-14 DIAGNOSIS — R002 Palpitations: Secondary | ICD-10-CM | POA: Insufficient documentation

## 2012-02-14 DIAGNOSIS — I379 Nonrheumatic pulmonary valve disorder, unspecified: Secondary | ICD-10-CM | POA: Insufficient documentation

## 2012-02-14 DIAGNOSIS — I428 Other cardiomyopathies: Secondary | ICD-10-CM | POA: Insufficient documentation

## 2012-02-14 DIAGNOSIS — I059 Rheumatic mitral valve disease, unspecified: Secondary | ICD-10-CM | POA: Insufficient documentation

## 2012-02-14 DIAGNOSIS — I079 Rheumatic tricuspid valve disease, unspecified: Secondary | ICD-10-CM | POA: Insufficient documentation

## 2012-02-14 DIAGNOSIS — I472 Ventricular tachycardia: Secondary | ICD-10-CM

## 2012-02-14 DIAGNOSIS — M6281 Muscle weakness (generalized): Secondary | ICD-10-CM | POA: Insufficient documentation

## 2012-02-14 DIAGNOSIS — D869 Sarcoidosis, unspecified: Secondary | ICD-10-CM | POA: Insufficient documentation

## 2012-02-14 NOTE — Progress Notes (Signed)
Echocardiogram performed.  

## 2012-02-18 ENCOUNTER — Encounter: Payer: Self-pay | Admitting: *Deleted

## 2012-02-19 ENCOUNTER — Encounter: Payer: Self-pay | Admitting: Cardiology

## 2012-02-19 ENCOUNTER — Ambulatory Visit (INDEPENDENT_AMBULATORY_CARE_PROVIDER_SITE_OTHER): Payer: Medicare Other | Admitting: Cardiology

## 2012-02-19 ENCOUNTER — Encounter: Payer: Self-pay | Admitting: *Deleted

## 2012-02-19 VITALS — BP 110/68 | HR 72 | Ht 66.0 in | Wt 201.0 lb

## 2012-02-19 DIAGNOSIS — R0602 Shortness of breath: Secondary | ICD-10-CM

## 2012-02-19 DIAGNOSIS — I519 Heart disease, unspecified: Secondary | ICD-10-CM

## 2012-02-19 LAB — BASIC METABOLIC PANEL
BUN: 16 mg/dL (ref 6–23)
Chloride: 101 mEq/L (ref 96–112)
GFR: 98.73 mL/min (ref >60.00)
Potassium: 3.9 mEq/L (ref 3.5–5.1)
Sodium: 139 mEq/L (ref 135–145)

## 2012-02-19 LAB — CBC WITH DIFFERENTIAL/PLATELET
Basophils Relative: 0.4 % (ref 0.0–3.0)
Eosinophils Absolute: 0.1 10*3/uL (ref 0.0–0.7)
Eosinophils Relative: 4.2 % (ref 0.0–5.0)
Lymphocytes Relative: 29.2 % (ref 12.0–46.0)
MCV: 89.3 fl (ref 78.0–100.0)
Monocytes Absolute: 0.3 10*3/uL (ref 0.1–1.0)
Neutrophils Relative %: 57.8 % (ref 43.0–77.0)
Platelets: 210 10*3/uL (ref 150.0–400.0)
RBC: 4.12 Mil/uL (ref 3.87–5.11)
WBC: 3.3 10*3/uL — ABNORMAL LOW (ref 4.5–10.5)

## 2012-02-19 LAB — PROTIME-INR: Prothrombin Time: 10.3 s (ref 10.2–12.4)

## 2012-02-19 NOTE — Progress Notes (Signed)
 ELECTROPHYSIOLOGY OFFICE NOTE  Patient ID: [name] MRN: [id number], DOB/AGE: 10/05/1950   Date of Visit: 02/19/2012  Primary Physician: Marne Tower, MD Primary Cardiologist: Steven Klein, MD Reason for Visit: Follow-up to reassess weakness; discuss results of echo and Holter  History of Present Illness [name] is a pleasant 61 year old woman with sarcoidosis, paroxysmal VT s/p single chamber ICD, nonobstructive CAD s/p LHC in 2005 and prior Takotsubo cardiomyopathy who presents today for follow-up to reassess her weakness and to discuss the results of her echocardiogram and Holter monitor. She was seen 2 weeks ago by me and Dr. Klein at which time she reported persistent, generalized weakness. This has been persistent/constant, waxing and waning with activity x 2 weeks. She reports racing palpitations with her weakness which have been intermittent but are occurring daily. She denied chest pain or shortness of breath at that visit but reports intermittent chest tightness over the last 1-2 days today. This is intermittent and has occurred both at rest and with activity. She reports it improves at times with positional changes. She denies near-syncope or syncope. She reports having her annual physical with labs, including a TSH, at her PCP's office in May and tells me "everything was fine." She denies any recent illness, fever or chills. She denies any recent changes to her medications and reports she is compliant.   Her echocardiogram shows moderate LV dysfunction with diffuse hypokinesis, EF 35-40%, which is new since her last echo in September 2012. She denies LE swelling, orthopnea or PND.  Past Medical History  Diagnosis Date  . Automatic implantable cardiac defibrillator in situ   . Paroxysmal ventricular tachycardia   . Ostium secundum type atrial septal defect   . Unspecified transient cerebral ischemia   . Takotsubo syndrome   . Otalgia, unspecified   . Other  malaise and fatigue   . Unspecified vitamin D deficiency   . Headache   . Tachycardia, unspecified   . Other pulmonary embolism and infarction   . Mitral valve disorders   . Other chronic nonalcoholic liver disease   . Myalgia and myositis, unspecified   . Cerebral aneurysm, nonruptured   . Unspecified hypothyroidism   . Unspecified essential hypertension   . Other and unspecified hyperlipidemia   . Esophageal reflux   . Diverticulosis of colon (without mention of hemorrhage)   . Depressive disorder, not elsewhere classified   . Allergic rhinitis, cause unspecified   . Enlargement of lymph nodes   . Sarcoidosis   . Obstructive sleep apnea (adult) (pediatric)   . GI bleed   . PUD (peptic ulcer disease)     Past Surgical History  Procedure Date  . Partial hysterectomy     Fibroids  . Cerebral aneurysm repair 02/1999  . Knee arthroscopy     bilateral  . Cardiac defibrillator placement     Guidant  . Adenosine cardiolite 2003    neg, EF 50%  . Carotid dopplers 2006    neg  . Hospitalization 2004    GI bleed, PUD, diverticulosis (EGD,colonscopy)  . Hospitalization     PE, NSVT, s/p defib    Allergies/Intolerances Allergen Reactions  . Amitriptyline Hcl unknown  . Atorvastatin severe muscle pain  . Cetirizine Hcl sleepy  . Codeine itching  . Paroxetine unknown  . Rosuvastatin severe muscle pain  . Carbamazepine rash  . Dilantin (Phenytoin Sodium Extended) rash   Current Home Medications Medication Sig Dispense Refill  . acetaminophen (TYLENOL) 650 MG CR tablet   Take 650 mg by mouth every 8 (eight) hours as needed.        . aspirin 81 MG tablet Take 81 mg by mouth daily.        . azelastine (ASTELIN) 137 MCG/SPRAY nasal spray Place 2 sprays into the nose 2 (two) times daily. Use in each nostril as directed  90 mL  3  . diclofenac sodium (VOLTAREN) 1 % GEL Apply 1 application topically as directed.      . hydrochlorothiazide (HYDRODIURIL) 25 MG tablet Take 0.5 tablets  (12.5 mg total) by mouth daily.  45 tablet  3  . levothyroxine (SYNTHROID, LEVOTHROID) 50 MCG tablet Take 1 tablet (50 mcg total) by mouth daily.  90 tablet  3  . metoprolol tartrate (LOPRESSOR) 25 MG tablet Take 1 tablet (25 mg total) by mouth 2 (two) times daily.  30 tablet  0  . polyethylene glycol (MIRALAX / GLYCOLAX) packet Take 17 g by mouth 4 (four) times daily.        . potassium chloride (K-DUR) 10 MEQ tablet 1/2 tab po qd      . ramipril (ALTACE) 5 MG capsule Take 1 capsule (5 mg total) by mouth daily.  90 capsule  3  . simvastatin (ZOCOR) 40 MG tablet Take one half tablet by mouth daily  45 tablet  3  . traMADol (ULTRAM) 50 MG tablet Take 50 mg by mouth every 8 (eight) hours as needed.        . VITAMIN D, CHOLECALCIFEROL, PO Take 1 tablet by mouth daily.        . zolpidem (AMBIEN) 10 MG tablet Take 1 tablet (10 mg total) by mouth at bedtime as needed.  90 tablet  0   Family History Strong family history of cardiomyopathy; patient is unsure of specific diagnosis  Social History Social History  . Marital Status: Divorced    Number of Children: 2   Occupational History  . Retired    Social History Main Topics  . Smoking status: Former Smoker    Quit date: 08/21/1991  . Smokeless tobacco: No  . Alcohol Use: Yes, rarely  . Drug Use: No   Social History Narrative   Retired from [employer]2 childrenLives aloneDivorced    Review of Systems General:  No chills, fever, night sweats or weight changes Cardiovascular:  No edema, orthopnea, paroxysmal nocturnal dyspnea Dermatological: No rash, lesions or masses Respiratory: No cough Urologic: No hematuria, dysuria Abdominal:   No nausea, vomiting, diarrhea, bright red blood per rectum, melena, or hematemesis Neurologic:  No visual changes or changes in mental status All other systems reviewed and are otherwise negative except as noted above.  Physical Exam Blood pressure 110/68, pulse 72, height 5' 6" (1.676 m), weight 201 lb  (91.173 kg).  General: Well developed, well appearing 61 year old female in no acute distress. HEENT: Normocephalic, atraumatic. EOMs intact. Sclera nonicteric. Oropharynx clear.  Neck: Supple without bruits. No JVD. Lungs:  Respirations regular and unlabored, CTA bilaterally. No wheezes, rales or rhonchi. Heart: RRR. S1, S2 present. No murmurs, rub, S3 or S4. Abdomen: Soft, non-distended.  Extremities: No clubbing, cyanosis or edema. DP/PT/Radials 2+ and equal bilaterally. Psych: Normal affect. Neuro: Alert and oriented X 3. Moves all extremities spontaneously.   Diagnostics Echocardiogram 02/14/2012 - Left ventricle: The cavity size was normal. Wall thickness was normal. Systolic function was moderately reduced. The estimated ejection fraction was in the range of 35% to 40%. Diffuse hypokinesis. Doppler parameters are consistent with abnormal   left ventricular relaxation (grade 1 diastolic dysfunction). - Aortic valve: Trileaflet; normal thickness leaflets. Mobility was not restricted. Doppler: Transvalvular velocity was within the normal range. There was no stenosis. No regurgitation. - Aorta: Aortic root: The aortic root was normal in size. - Mitral valve: Structurally normal valve. Mobility was not restricted. Doppler: Transvalvular velocity was within the normal range. There was no evidence for stenosis. Mild regurgitation. - Left atrium: The atrium was normal in size. - Right ventricle: The cavity size was normal. Pacer wire or catheter noted in right ventricle. Systolic function was normal. - Pulmonic valve: Doppler: Transvalvular velocity was within the normal range. There was no evidence for stenosis. Trivial regurgitation. - Tricuspid valve: Structurally normal valve. Doppler: Transvalvular velocity was within the normal range. Mild regurgitation. - Pulmonary artery: Systolic pressure was mildly increased. - Right atrium: The atrium was normal in size. Pacer wire or catheter noted in  right atrium. - Pericardium: There was no pericardial effusion. - Systemic veins: Inferior vena cava: The vessel was normal in size.  Holter monitor shows normal sinus rhythm with occasional PVCs and one episode of ventricular trigeminy  Assessment and Plan 1. New LV dysfunction - EF 35-40% (was 55% by echo Sept 2012); h/o Takotsubo cardiomyopathy; h/o nonobstructive CAD by cardiac cath in 2005 (the results of this are not available at the time of this dictation); recommend definitive evaluation with cardiac catheterization; procedure was reviewed with [name] in detail, including risks and benefits; these risks include, but are not limited to, death, stroke, bleeding, blood clots, damage to the blood vessels or damage to the kidneys; she was also provided written literature regarding cardiac catheterization for her review; [name] expressed verbal understanding and agrees to proceed; she is euvolemic by exam today; she will continue medical therapy with beta blocker and ACEI (currently on metoprolol tartrate, with h/o paroxysmal VT, but consider changing her BB to carvedilol or metoprolol succinate in setting of LV dysfunction)  This plan of care was formulated with Dr. Steven Klein, her primary cardiologist, via phone. Signed, Eevie Lapp, PA-C 02/19/2012, 4:01 PM    

## 2012-02-19 NOTE — Patient Instructions (Signed)
See instruction sheet for catheterization

## 2012-02-20 ENCOUNTER — Encounter (HOSPITAL_COMMUNITY): Payer: Self-pay | Admitting: Pharmacy Technician

## 2012-02-22 ENCOUNTER — Ambulatory Visit (HOSPITAL_COMMUNITY)
Admission: RE | Admit: 2012-02-22 | Discharge: 2012-02-22 | Disposition: A | Discharge status: Home / Self Care | Payer: Medicare Other | Source: Ambulatory Visit | Attending: Cardiovascular Disease | Admitting: Cardiovascular Disease

## 2012-02-22 ENCOUNTER — Encounter (HOSPITAL_COMMUNITY)
Admission: RE | Disposition: A | Discharge status: Home / Self Care | Payer: Self-pay | Source: Ambulatory Visit | Attending: Cardiovascular Disease

## 2012-02-22 DIAGNOSIS — K219 Gastro-esophageal reflux disease without esophagitis: Secondary | ICD-10-CM | POA: Insufficient documentation

## 2012-02-22 DIAGNOSIS — G4733 Obstructive sleep apnea (adult) (pediatric): Secondary | ICD-10-CM | POA: Insufficient documentation

## 2012-02-22 DIAGNOSIS — R0789 Other chest pain: Secondary | ICD-10-CM | POA: Insufficient documentation

## 2012-02-22 DIAGNOSIS — Z95810 Presence of automatic (implantable) cardiac defibrillator: Secondary | ICD-10-CM | POA: Insufficient documentation

## 2012-02-22 DIAGNOSIS — E785 Hyperlipidemia, unspecified: Secondary | ICD-10-CM | POA: Insufficient documentation

## 2012-02-22 DIAGNOSIS — R079 Chest pain, unspecified: Secondary | ICD-10-CM

## 2012-02-22 DIAGNOSIS — I519 Heart disease, unspecified: Secondary | ICD-10-CM | POA: Insufficient documentation

## 2012-02-22 DIAGNOSIS — I10 Essential (primary) hypertension: Secondary | ICD-10-CM | POA: Insufficient documentation

## 2012-02-22 HISTORY — PX: LEFT HEART CATHETERIZATION WITH CORONARY ANGIOGRAM: SHX5451

## 2012-02-22 SURGERY — LEFT HEART CATHETERIZATION WITH CORONARY ANGIOGRAM
Anesthesia: LOCAL

## 2012-02-22 MED ORDER — SODIUM CHLORIDE 0.9 % IV SOLN: 250.0000 mL | INTRAVENOUS | Status: DC | PRN

## 2012-02-22 MED ORDER — ASPIRIN 81 MG PO CHEW
324.0000 mg | CHEWABLE_TABLET | ORAL | Status: AC
  Administered 2012-02-22: 324 mg via ORAL

## 2012-02-22 MED ORDER — ASPIRIN 81 MG PO CHEW
CHEWABLE_TABLET | ORAL | Status: AC
  Filled 2012-02-22: qty 4

## 2012-02-22 MED ORDER — ACETAMINOPHEN 325 MG PO TABS: 650.0000 mg | ORAL_TABLET | ORAL | Status: DC | PRN

## 2012-02-22 MED ORDER — MIDAZOLAM HCL 2 MG/2ML IJ SOLN
INTRAMUSCULAR | Status: AC
  Filled 2012-02-22: qty 2

## 2012-02-22 MED ORDER — HEPARIN (PORCINE) IN NACL 2-0.9 UNIT/ML-% IJ SOLN
INTRAMUSCULAR | Status: AC
  Filled 2012-02-22: qty 2000

## 2012-02-22 MED ORDER — SODIUM CHLORIDE 0.9 % IJ SOLN: 3.0000 mL | Freq: Two times a day (BID) | INTRAMUSCULAR | Status: DC

## 2012-02-22 MED ORDER — FENTANYL CITRATE 0.05 MG/ML IJ SOLN
INTRAMUSCULAR | Status: AC
  Filled 2012-02-22: qty 2

## 2012-02-22 MED ORDER — SODIUM CHLORIDE 0.9 % IV SOLN
INTRAVENOUS | Status: DC
  Administered 2012-02-22: 09:00:00 via INTRAVENOUS

## 2012-02-22 MED ORDER — LIDOCAINE HCL (PF) 1 % IJ SOLN
INTRAMUSCULAR | Status: AC
  Filled 2012-02-22: qty 30

## 2012-02-22 MED ORDER — NITROGLYCERIN 0.2 MG/ML ON CALL CATH LAB
INTRAVENOUS | Status: AC
  Filled 2012-02-22: qty 1

## 2012-02-22 MED ORDER — SODIUM CHLORIDE 0.9 % IV SOLN: INTRAVENOUS | Status: DC

## 2012-02-22 MED ORDER — ONDANSETRON HCL 4 MG/2ML IJ SOLN: 4.0000 mg | Freq: Four times a day (QID) | INTRAMUSCULAR | Status: DC | PRN

## 2012-02-22 MED ORDER — SODIUM CHLORIDE 0.9 % IJ SOLN: 3.0000 mL | INTRAMUSCULAR | Status: DC | PRN

## 2012-02-22 NOTE — Progress Notes (Signed)
UP AND WALKED AND TOL WELL; RIGHT GROIN STABLE; NO BLEEDING OR HEMATOMA 

## 2012-02-22 NOTE — Interval H&P Note (Signed)
History and Physical Interval Note:  02/22/2012 11:17 AM  [name]  has presented today for surgery, with the diagnosis of Chest Pain  The various methods of treatment have been discussed with the patient and family. After consideration of risks, benefits and other options for treatment, the patient has consented to  Procedure(s) (LRB): LEFT HEART CATHETERIZATION WITH CORONARY ANGIOGRAM (N/A) as a surgical intervention .  The patient's history has been reviewed, patient examined, no change in status, stable for surgery.  I have reviewed the patients' chart and labs.  Questions were answered to the patient's satisfaction.     MCALHANY,CHRISTOPHER

## 2012-02-22 NOTE — H&P (View-Only) (Signed)
ELECTROPHYSIOLOGY OFFICE NOTE  Patient ID: [name] MRN: [id number], DOB/AGE: [birth year]   Date of Visit: 02/19/2012  Primary Physician: Roxy Manns, MD Primary Cardiologist: Sherryl Manges, MD Reason for Visit: Follow-up to reassess weakness; discuss results of echo and Holter  History of Present Illness [name] is a pleasant 61 year old woman woman with sarcoidosis, paroxysmal VT s/p single chamber ICD, nonobstructive CAD s/p LHC in 2005 and prior Takotsubo cardiomyopathy who presents today for follow-up to reassess her weakness and to discuss the results of her echocardiogram and Holter monitor. She was seen 2 weeks ago by me and Dr. Graciela Husbands at which time she reported persistent, generalized weakness. This has been persistent/constant, waxing and waning with activity x 2 weeks. She reports racing palpitations with her weakness which have been intermittent but are occurring daily. She denied chest pain or shortness of breath at that visit but reports intermittent chest tightness over the last 1-2 days today. This is intermittent and has occurred both at rest and with activity. She reports it improves at times with positional changes. She denies near-syncope or syncope. She reports having her annual physical with labs, including a TSH, at her PCP's office in May and tells me "everything was fine." She denies any recent illness, fever or chills. She denies any recent changes to her medications and reports she is compliant.   Her echocardiogram shows moderate LV dysfunction with diffuse hypokinesis, EF 35-40%, which is new since her last echo in September 2012. She denies LE swelling, orthopnea or PND.  Past Medical History  Diagnosis Date  . Automatic implantable cardiac defibrillator in situ   . Paroxysmal ventricular tachycardia   . Ostium secundum type atrial septal defect   . Unspecified transient cerebral ischemia   . Takotsubo syndrome   . Otalgia, unspecified   . Other  malaise and fatigue   . Unspecified vitamin D deficiency   . Headache   . Tachycardia, unspecified   . Other pulmonary embolism and infarction   . Mitral valve disorders   . Other chronic nonalcoholic liver disease   . Myalgia and myositis, unspecified   . Cerebral aneurysm, nonruptured   . Unspecified hypothyroidism   . Unspecified essential hypertension   . Other and unspecified hyperlipidemia   . Esophageal reflux   . Diverticulosis of colon (without mention of hemorrhage)   . Depressive disorder, not elsewhere classified   . Allergic rhinitis, cause unspecified   . Enlargement of lymph nodes   . Sarcoidosis   . Obstructive sleep apnea (adult) (pediatric)   . GI bleed   . PUD (peptic ulcer disease)     Past Surgical History  Procedure Date  . Partial hysterectomy     Fibroids  . Cerebral aneurysm repair 02/1999  . Knee arthroscopy     bilateral  . Cardiac defibrillator placement     Guidant  . Adenosine cardiolite 2003    neg, EF 50%  . Carotid dopplers 2006    neg  . Hospitalization 2004    GI bleed, PUD, diverticulosis (EGD,colonscopy)  . Hospitalization     PE, NSVT, s/p defib    Allergies/Intolerances Allergen Reactions  . Amitriptyline Hcl unknown  . Atorvastatin severe muscle pain  . Cetirizine Hcl sleepy  . Codeine itching  . Paroxetine unknown  . Rosuvastatin severe muscle pain  . Carbamazepine rash  . Dilantin (Phenytoin Sodium Extended) rash   Current Home Medications Medication Sig Dispense Refill  . acetaminophen (TYLENOL) 650 MG CR tablet  Take 650 mg by mouth every 8 (eight) hours as needed.        [name] aspirin 81 MG tablet Take 81 mg by mouth daily.        [name] azelastine (ASTELIN) 137 MCG/SPRAY nasal spray Place 2 sprays into the nose 2 (two) times daily. Use in each nostril as directed  90 mL  3  . diclofenac sodium (VOLTAREN) 1 % GEL Apply 1 application topically as directed.      . hydrochlorothiazide (HYDRODIURIL) 25 MG tablet Take 0.5 tablets  (12.5 mg total) by mouth daily.  45 tablet  3  . levothyroxine (SYNTHROID, LEVOTHROID) 50 MCG tablet Take 1 tablet (50 mcg total) by mouth daily.  90 tablet  3  . metoprolol tartrate (LOPRESSOR) 25 MG tablet Take 1 tablet (25 mg total) by mouth 2 (two) times daily.  30 tablet  0  . polyethylene glycol (MIRALAX / GLYCOLAX) packet Take 17 g by mouth 4 (four) times daily.        . potassium chloride (K-DUR) 10 MEQ tablet 1/2 tab po qd      . ramipril (ALTACE) 5 MG capsule Take 1 capsule (5 mg total) by mouth daily.  90 capsule  3  . simvastatin (ZOCOR) 40 MG tablet Take one half tablet by mouth daily  45 tablet  3  . traMADol (ULTRAM) 50 MG tablet Take 50 mg by mouth every 8 (eight) hours as needed.        [name] VITAMIN D, CHOLECALCIFEROL, PO Take 1 tablet by mouth daily.        [name] zolpidem (AMBIEN) 10 MG tablet Take 1 tablet (10 mg total) by mouth at bedtime as needed.  90 tablet  0   Family History Strong family history of cardiomyopathy; patient is unsure of specific diagnosis  Social History Social History  . Marital Status: Divorced    Number of Children: 2   Occupational History  . Retired    Social History Main Topics  . Smoking status: Former Smoker    Quit date: 08/21/1991  . Smokeless tobacco: No  . Alcohol Use: Yes, rarely  . Drug Use: No   Social History Narrative   Retired from [employer] aloneDivorced    Review of Systems General:  No chills, fever, night sweats or weight changes Cardiovascular:  No edema, orthopnea, paroxysmal nocturnal dyspnea Dermatological: No rash, lesions or masses Respiratory: No cough Urologic: No hematuria, dysuria Abdominal:   No nausea, vomiting, diarrhea, bright red blood per rectum, melena, or hematemesis Neurologic:  No visual changes or changes in mental status All other systems reviewed and are otherwise negative except as noted above.  Physical Exam Blood pressure 110/68, pulse 72, height 5\' 6"  (1.676 m), weight 201 lb  (91.173 kg).  General: Well developed, well appearing 61 year old female female in no acute distress. HEENT: Normocephalic, atraumatic. EOMs intact. Sclera nonicteric. Oropharynx clear.  Neck: Supple without bruits. No JVD. Lungs:  Respirations regular and unlabored, CTA bilaterally. No wheezes, rales or rhonchi. Heart: RRR. S1, S2 present. No murmurs, rub, S3 or S4. Abdomen: Soft, non-distended.  Extremities: No clubbing, cyanosis or edema. DP/PT/Radials 2+ and equal bilaterally. Psych: Normal affect. Neuro: Alert and oriented X 3. Moves all extremities spontaneously.   Diagnostics Echocardiogram 02/14/2012 - Left ventricle: The cavity size was normal. Wall thickness was normal. Systolic function was moderately reduced. The estimated ejection fraction was in the range of 35% to 40%. Diffuse hypokinesis. Doppler parameters are consistent with abnormal  left ventricular relaxation (grade 1 diastolic dysfunction). - Aortic valve: Trileaflet; normal thickness leaflets. Mobility was not restricted. Doppler: Transvalvular velocity was within the normal range. There was no stenosis. No regurgitation. - Aorta: Aortic root: The aortic root was normal in size. - Mitral valve: Structurally normal valve. Mobility was not restricted. Doppler: Transvalvular velocity was within the normal range. There was no evidence for stenosis. Mild regurgitation. - Left atrium: The atrium was normal in size. - Right ventricle: The cavity size was normal. Pacer wire or catheter noted in right ventricle. Systolic function was normal. - Pulmonic valve: Doppler: Transvalvular velocity was within the normal range. There was no evidence for stenosis. Trivial regurgitation. - Tricuspid valve: Structurally normal valve. Doppler: Transvalvular velocity was within the normal range. Mild regurgitation. - Pulmonary artery: Systolic pressure was mildly increased. - Right atrium: The atrium was normal in size. Pacer wire or catheter noted in  right atrium. - Pericardium: There was no pericardial effusion. - Systemic veins: Inferior vena cava: The vessel was normal in size.  Holter monitor shows normal sinus rhythm with occasional PVCs and one episode of ventricular trigeminy  Assessment and Plan 1. New LV dysfunction - EF 35-40% (was 55% by echo Sept 2012); h/o Takotsubo cardiomyopathy; h/o nonobstructive CAD by cardiac cath in 2005 (the results of this are not available at the time of this dictation); recommend definitive evaluation with cardiac catheterization; procedure was reviewed with Ms. Portner in detail, including risks and benefits; these risks include, but are not limited to, death, stroke, bleeding, blood clots, damage to the blood vessels or damage to the kidneys; she was also provided written literature regarding cardiac catheterization for her review; Ms. Capitano expressed verbal understanding and agrees to proceed; she is euvolemic by exam today; she will continue medical therapy with beta blocker and ACEI (currently on metoprolol tartrate, with h/o paroxysmal VT, but consider changing her BB to carvedilol or metoprolol succinate in setting of LV dysfunction)  This plan of care was formulated with Dr. Sherryl Manges, her primary cardiologist, via phone. Signed, Rick Duff, PA-C 02/19/2012, 4:01 PM

## 2012-02-22 NOTE — CV Procedure (Signed)
   Cardiac Catheterization Operative Report  [name] [id number] 7/5/201311:46 AM Roxy Manns, MD  Procedure Performed:  1. Left Heart Catheterization 2. Selective Coronary Angiography 3. Left ventricular angiogram  Operator: Verne Carrow, MD  Indication:  New LV systolic dysfunction noted on echo. Occasional chest pains.                                      Procedure Details: The risks, benefits, complications, treatment options, and expected outcomes were discussed with the patient. The patient and/or family concurred with the proposed plan, giving informed consent. The patient was brought to the cath lab after IV hydration was begun and oral premedication was given. The patient was further sedated with Versed and Fentanyl. The right groin was prepped and draped in the usual manner. Using the modified Seldinger access technique, a 5 French sheath was placed in the right femoral artery. Standard diagnostic catheters were used to perform selective coronary angiography. A pigtail catheter was used to perform a left ventricular angiogram.  There were no immediate complications. The patient was taken to the recovery area in stable condition.   Hemodynamic Findings: Central aortic pressure: 124/57 Left ventricular pressure: 121/3/10  Angiographic Findings:  Left main: No evidence of obstruction.   Left Anterior Descending Artery: Large caliber vessel that courses to the apex. Moderate sized diagonal branch. No evidence of obstruction.   Circumflex Artery: Moderate sized vessel that gives off two moderate sized marginal branches. No evidence of obstruction.   Right Coronary Artery: Large, dominant vessel. No evidence of obstruction.   Left Ventricular Angiogram: LVEF 45%.   Impression: 1. No angiographic evidence of disease.  2. Mild LV systolic dysfunction. 3. Non-cardiac chest pain  Recommendations: No further ischemic workup. Follow up with Dr. Graciela Husbands as  planned.        Complications:  None. The patient tolerated the procedure well.

## 2012-02-25 ENCOUNTER — Telehealth: Payer: Self-pay | Admitting: *Deleted

## 2012-02-25 NOTE — Telephone Encounter (Signed)
I left a message for the patient to call regarding her holter monitor.

## 2012-02-26 NOTE — Telephone Encounter (Signed)
I spoke with the patient about her holter results. She is very concerned about what is going on with her heart function. She wants to know from Dr. Graciela Husbands what the plan would potentially be for her. She is scheduled to come in next week to see Scott on 7/19. She is worried she shouldn't wait that long. I explained to her that her EF on cath is 45%, which is better than thought on echo. I also explained it is not unusual for post hospital patients to come in 2 weeks post procedure with the PA for f/u. She states she is having episodes of hot flashes, not related to SOB or palpitations. She is still having some SOB. She had several palpitations yesterday. Her holter showed infrequent PVC's (6/hr) multiple morphologies, occasional PAC's, HR range is normal (average 67 bpm), per Dr. Graciela Husbands. The patient is very anxious. I explained I would forward this to Dr. Graciela Husbands to see if he has a plan or recommendations for her. I will call her back after reviewed.

## 2012-02-26 NOTE — Telephone Encounter (Signed)
Pt rtn call from yesterday and she can be reached at same number

## 2012-02-27 NOTE — Telephone Encounter (Signed)
Dr. Graciela Husbands reviewed this message. No recommendations given. He would like to r/s the patient's post hospital appointment on 7/19 with the PA to himself. I have left a message on the patient's identified voice mail that Dr. Graciela Husbands would see her at 9:45 am that day.

## 2012-03-01 ENCOUNTER — Other Ambulatory Visit: Payer: Self-pay | Admitting: Internal Medicine

## 2012-03-07 ENCOUNTER — Encounter: Payer: Self-pay | Admitting: Internal Medicine

## 2012-03-07 ENCOUNTER — Ambulatory Visit (INDEPENDENT_AMBULATORY_CARE_PROVIDER_SITE_OTHER): Payer: Medicare Other | Admitting: Internal Medicine

## 2012-03-07 ENCOUNTER — Encounter: Payer: Medicare Other | Admitting: Physician Assistant

## 2012-03-07 VITALS — BP 100/68 | HR 71 | Ht 66.0 in | Wt 197.0 lb

## 2012-03-07 DIAGNOSIS — I472 Ventricular tachycardia: Secondary | ICD-10-CM

## 2012-03-07 DIAGNOSIS — Z95810 Presence of automatic (implantable) cardiac defibrillator: Secondary | ICD-10-CM

## 2012-03-07 DIAGNOSIS — I428 Other cardiomyopathies: Secondary | ICD-10-CM | POA: Insufficient documentation

## 2012-03-07 LAB — ICD DEVICE OBSERVATION
BRDY-0002RV: 40 {beats}/min
CHARGE TIME: 8.9 s
DEV-0020ICD: NEGATIVE
HV IMPEDENCE: 55 Ohm
RV LEAD IMPEDENCE ICD: 581 Ohm
TZAT-0001FASTVT: 2
TZAT-0004FASTVT: 8
TZAT-0013FASTVT: 1
TZAT-0018FASTVT: NEGATIVE
TZON-0003FASTVT: 286 ms
TZON-0004FASTVT: 7
TZON-0005FASTVT: 1
TZST-0001FASTVT: 3
TZST-0001FASTVT: 6
TZST-0001FASTVT: 8
TZST-0003FASTVT: 31 J
TZST-0003FASTVT: 41 J
TZST-0003FASTVT: 41 J

## 2012-03-07 MED ORDER — CARVEDILOL 6.25 MG PO TABS: 6.2500 mg | ORAL_TABLET | Freq: Two times a day (BID) | ORAL | Status: DC

## 2012-03-07 NOTE — Patient Instructions (Signed)
Your physician has recommended you make the following change in your medication:  1) Stop metoprolol 2) Start Coreg (carvedilol) 6.25 mg one tablet by mouth twice daily.  Your physician wants you to follow-up in: 1 year with Dr. Graciela Husbands. You will receive a reminder letter in the mail two months in advance. If you don't receive a letter, please call our office to schedule the follow-up appointment.

## 2012-03-07 NOTE — Progress Notes (Signed)
HPI  [name] is a 61 y.o. female Seen in followup for ICD implanted for secondary prevention ventricular tachycardia. She also has a history of sarcoid and nonobstructive coronary disease and presumed pots we will cardiomyopathy.  He seen a few weeks ago with complaints of weakness. She underwent echocardiogram demonstrated moderate left ventricular dysfunction with EF of 35-40% new from September 2012; is a submitted for catheterization demonstrating no significant coronary disease and an LVEF of 45%  The patient denies chest pain  nocturnal dyspnea, orthopnea or peripheral edema.  There have been no palpitations, lightheadedness or syncope. She has occasional episodes of shortness of breath that lasted about 15 minutes that are associated with palpitations. She did not have one of these episodes while wearing her monitor  She notes that there is a strong family history of cardiomyopathy.  Past Medical History  Diagnosis Date  . Automatic implantable cardiac defibrillator in situ   . Paroxysmal ventricular tachycardia   . Ostium secundum type atrial septal defect   . Unspecified transient cerebral ischemia   . Takotsubo syndrome   . Otalgia, unspecified   . Other malaise and fatigue   . Unspecified vitamin D deficiency   . Headache   . Tachycardia, unspecified   . Other pulmonary embolism and infarction   . Mitral valve disorders   . Other chronic nonalcoholic liver disease   . Myalgia and myositis, unspecified   . Cerebral aneurysm, nonruptured   . Unspecified hypothyroidism   . Unspecified essential hypertension   . Other and unspecified hyperlipidemia   . Esophageal reflux   . Diverticulosis of colon (without mention of hemorrhage)   . Depressive disorder, not elsewhere classified   . Allergic rhinitis, cause unspecified   . Enlargement of lymph nodes   . Sarcoidosis   . Obstructive sleep apnea (adult) (pediatric)   . GI bleed   . PUD (peptic ulcer disease)      Past Surgical History  Procedure Date  . Partial hysterectomy     Fibroids  . Cerebral aneurysm repair 02/1999  . Knee arthroscopy     bilateral  . Cardiac defibrillator placement     Guidant  . Adenosine cardiolite 2003    neg, EF 50%  . Carotid dopplers 2006    neg  . Hospitalization 2004    GI bleed, PUD, diverticulosis (EGD,colonscopy)  . Hospitalization     PE, NSVT, s/p defib  . Abi 2006    normal    Current Outpatient Prescriptions  Medication Sig Dispense Refill  . acetaminophen (TYLENOL) 500 MG tablet Take 500 mg by mouth every 6 (six) hours as needed. For pain      . aspirin 81 MG tablet Take 81 mg by mouth daily.        [name] azelastine (ASTELIN) 137 MCG/SPRAY nasal spray Place 2 sprays into the nose 2 (two) times daily. Use in each nostril as directed  90 mL  3  . diclofenac sodium (VOLTAREN) 1 % GEL Apply 1 application topically daily as needed. For pain      . hydrochlorothiazide (HYDRODIURIL) 25 MG tablet Take 0.5 tablets (12.5 mg total) by mouth daily.  45 tablet  3  . hydrocodone-acetaminophen (LORCET-HD) 5-500 MG per capsule Take 1 capsule by mouth 3 (three) times daily as needed. For pain      . latanoprost (XALATAN) 0.005 % ophthalmic solution Place 1 drop into both eyes at bedtime.      [name] levothyroxine (SYNTHROID, LEVOTHROID) 50 MCG  tablet Take 1 tablet (50 mcg total) by mouth daily.  90 tablet  3  . metoprolol tartrate (LOPRESSOR) 25 MG tablet TAKE 1 TABLET TWICE DAILY  30 tablet  0  . naproxen sodium (ALEVE) 220 MG tablet Take 220 mg by mouth 2 (two) times daily as needed. For pain      . polyethylene glycol (MIRALAX / GLYCOLAX) packet Take 17 g by mouth daily as needed. For constipation      . potassium chloride (K-DUR) 10 MEQ tablet 1/2 tab po qd      . potassium chloride (K-DUR,KLOR-CON) 10 MEQ tablet Take 5 mEq by mouth daily.      . ramipril (ALTACE) 5 MG capsule Take 1 capsule (5 mg total) by mouth daily.  90 capsule  3  . simvastatin (ZOCOR) 40 MG  tablet Take 20 mg by mouth at bedtime.      . traMADol (ULTRAM) 50 MG tablet Take 50 mg by mouth every 8 (eight) hours as needed. For pain      . zolpidem (AMBIEN) 10 MG tablet Take 10 mg by mouth at bedtime as needed. For sleep        Allergies  Allergen Reactions  . Amitriptyline Hcl     REACTION: ? reaction  . Atorvastatin     REACTION: severe muscle pain  . Cetirizine Hcl     REACTION: sleepy  . Codeine     REACTION: itching  . Paroxetine     REACTION: ? reaction  . Rosuvastatin     REACTION: severe muscle pain  . Carbamazepine Rash    REACTION: ? reaction  . Dilantin (Phenytoin Sodium Extended) Rash    Review of Systems negative except from HPI and PMH  Physical Exam BP 100/68  Pulse 71  Ht 5\' 6"  (1.676 m)  Wt 197 lb (89.359 kg)  BMI 31.80 kg/m2 Well developed and well nourished in no acute distress HENT normal E scleral and icterus clear Neck Supple JVP flat; carotids brisk and full Clear to ausculation Regular rate and rhythm, no murmurs gallops or rub Soft with active bowel sounds No clubbing cyanosis none Edema Alert and oriented, grossly normal motor and sensory function Skin Warm and Dry    Assessment and  Plan

## 2012-03-07 NOTE — Assessment & Plan Note (Signed)
The patient's device was interrogated.  The information was reviewed. No changes were made in the programming.    

## 2012-03-07 NOTE — Assessment & Plan Note (Signed)
We'll plan to transition from metoprolol tartrate carvedilol 6.25 twice daily for The augmented benefit

## 2012-03-07 NOTE — Assessment & Plan Note (Signed)
No intercurrent Ventricular tachycardia  

## 2012-04-02 ENCOUNTER — Other Ambulatory Visit: Payer: Medicare Other

## 2012-04-02 ENCOUNTER — Encounter: Payer: Self-pay | Admitting: Pulmonary Disease

## 2012-04-02 ENCOUNTER — Ambulatory Visit (INDEPENDENT_AMBULATORY_CARE_PROVIDER_SITE_OTHER): Payer: Medicare Other | Admitting: Pulmonary Disease

## 2012-04-02 VITALS — BP 102/58 | HR 63 | Temp 97.4°F | Ht 66.5 in | Wt 201.4 lb

## 2012-04-02 DIAGNOSIS — R0609 Other forms of dyspnea: Secondary | ICD-10-CM

## 2012-04-02 DIAGNOSIS — R0989 Other specified symptoms and signs involving the circulatory and respiratory systems: Secondary | ICD-10-CM

## 2012-04-02 NOTE — Progress Notes (Signed)
  Subjective:    Patient ID: [name], female    DOB: 1951-07-08, 61 y.o.   MRN: [id number]  HPI The patient comes in today for evaluation of dyspnea on exertion.  She was last seen 3 years ago, with a diagnosis of presumed sarcoidosis.  Because she was essentially asymptomatic with no end organ  damage, she was asked to followup in one year.  Unfortunately, the patient never returned, but she had been doing very well from a breathing standpoint until 2 months ago.  She began to notice worsening dyspnea on exertion, and ultimately had a cardiac evaluation where she was found to have a depressed ejection fraction.  She was also noted to have arrhythmias, and required a defibrillator placement.  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  The patient notes dyspnea on exertion primarily with heavier type of activities.  She feels that she can walk on flat ground at a moderate pace for miles.  However, she can get winded walking up stairs or doing her housework.  She does have a mild cough, but feels it is due to significant postnasal drip.  She is also on an ACE.  She has not had recent PFTS or radiographic evaluation.    Review of Systems  Constitutional: Negative for fever and unexpected weight change.  HENT: Positive for congestion, trouble swallowing and postnasal drip. Negative for ear pain, nosebleeds, sore throat, rhinorrhea, sneezing, dental problem and sinus pressure.   Eyes: Negative for redness and itching.  Respiratory: Positive for cough, chest tightness and shortness of breath. Negative for wheezing.   Cardiovascular: Negative for palpitations and leg swelling.  Gastrointestinal: Negative for nausea and vomiting.  Genitourinary: Negative for dysuria.  Musculoskeletal: Positive for arthralgias. Negative for joint swelling.  Skin: Positive for rash.  Neurological: Negative for headaches.  Hematological: Does not bruise/bleed easily.  Psychiatric/Behavioral: Negative  for dysphoric mood. The patient is nervous/anxious.   All other systems reviewed and are negative.       Objective:   Physical Exam Overweight female in no acute distress Nose without purulence or discharge noted Oropharynx clear Chest with mild basilar crackles, no wheezing Cardiac exam with regular rate and rhythm Lower extremities without edema, no cyanosis Alert and oriented, moves all 4 extremities.       Assessment & Plan:

## 2012-04-02 NOTE — Patient Instructions (Addendum)
Will schedule for ct chest to evaluate your lungs Will schedule for breathing studies. Will check bloodwork today to look at how active your sarcoid may be. Will arrange followup with me once the results are available.

## 2012-04-04 ENCOUNTER — Encounter: Payer: Self-pay | Admitting: Pulmonary Disease

## 2012-04-04 NOTE — Assessment & Plan Note (Signed)
The patient is having increased dyspnea on exertion that may or may not be related to her prior history of underlying lung disease.  We have presumed that she had sarcoidosis, but she has never had any tissue diagnosis because there was no evidence for organ impact or significant symptoms.  She now has a cardiomyopathy which may be her primary reason for her dyspnea, but I think we need to re\re evaluate her pulmonary status.  She has some underlying cough that I suspect is due to postnasal drip, but cannot exclude a contribution from her ACE inhibitor.  Will schedule for a followup CT chest and PFTs for further evaluation.  We may have to consider bronchoscopy for a diagnosis if she has increased parenchymal lung disease.

## 2012-04-10 ENCOUNTER — Ambulatory Visit (INDEPENDENT_AMBULATORY_CARE_PROVIDER_SITE_OTHER)
Admission: RE | Admit: 2012-04-10 | Discharge: 2012-04-10 | Disposition: A | Discharge status: Home / Self Care | Payer: Medicare Other | Source: Ambulatory Visit | Attending: Pulmonary Disease | Admitting: Pulmonary Disease

## 2012-04-10 DIAGNOSIS — R0989 Other specified symptoms and signs involving the circulatory and respiratory systems: Secondary | ICD-10-CM

## 2012-04-10 MED ORDER — IOHEXOL 300 MG/ML  SOLN: 80.0000 mL | Freq: Once | INTRAMUSCULAR | Status: AC | PRN

## 2012-04-15 ENCOUNTER — Encounter: Payer: Self-pay | Admitting: Pulmonary Disease

## 2012-04-17 ENCOUNTER — Ambulatory Visit (INDEPENDENT_AMBULATORY_CARE_PROVIDER_SITE_OTHER): Payer: Medicare Other | Admitting: Pulmonary Disease

## 2012-04-17 DIAGNOSIS — R0989 Other specified symptoms and signs involving the circulatory and respiratory systems: Secondary | ICD-10-CM

## 2012-04-17 NOTE — Progress Notes (Signed)
PFT done today. 

## 2012-04-22 ENCOUNTER — Encounter: Payer: Self-pay | Admitting: Family Medicine

## 2012-04-22 ENCOUNTER — Telehealth: Payer: Self-pay | Admitting: Internal Medicine

## 2012-04-22 ENCOUNTER — Ambulatory Visit (INDEPENDENT_AMBULATORY_CARE_PROVIDER_SITE_OTHER): Payer: Medicare Other | Admitting: Family Medicine

## 2012-04-22 VITALS — BP 110/76 | HR 68 | Temp 97.5°F | Resp 16

## 2012-04-22 DIAGNOSIS — R232 Flushing: Secondary | ICD-10-CM

## 2012-04-22 DIAGNOSIS — E039 Hypothyroidism, unspecified: Secondary | ICD-10-CM

## 2012-04-22 DIAGNOSIS — N951 Menopausal and female climacteric states: Secondary | ICD-10-CM

## 2012-04-22 MED ORDER — PANTOPRAZOLE SODIUM 40 MG PO TBEC: 40.0000 mg | DELAYED_RELEASE_TABLET | Freq: Every day | ORAL | Status: DC | PRN

## 2012-04-22 NOTE — Patient Instructions (Addendum)
Try black kohash over the counter for hot flashes to see if it helps  Will check thyroid today Stay active - try to exercise/ drink enough water  Keep me updated

## 2012-04-22 NOTE — Assessment & Plan Note (Signed)
Check tsh today  Pt has increasing hot flashes - also claims to have lost wt

## 2012-04-22 NOTE — Assessment & Plan Note (Signed)
Disc hot flashes- suspect hormonal Not candidate for HRT in light of hx of PE and cardiact problems  Disc trial of black kohash Will also think about prozac   (intol of paxil)

## 2012-04-22 NOTE — Progress Notes (Signed)
Subjective:    Patient ID: [name], female    DOB: [birth year], 61 y.o.   MRN: [id number]  HPI Is having terrible hormonal hot flashes - hard for her to sleep  She told Dr Graciela Husbands - her cardiology   She is miserable - and it really bothers her  She is not candidate for HRT due to hx of blood clot/ PE  These days - her hot flashes are worse at night -about every hour It is hard to plan on going somewhere She does not sweat --- so hard to cool herself down  Is not exercising   Has not tried black kohash   She also gets aches and pains   Also gets some mood changes   Lab Results  Component Value Date   TSH 1.62 01/03/2012   she thinks she has lost some wt since then  Patient Active Problem List  Diagnosis  . HYPOTHYROIDISM  . UNSPECIFIED VITAMIN D DEFICIENCY  . HYPERLIPIDEMIA  . DEPRESSION  . SLEEP APNEA, OBSTRUCTIVE  . HYPERTENSION  . PULMONARY EMBOLISM  . MITRAL VALVE PROLAPSE  . VENTRICULAR TACHYCARDIA  . TAKOTSUBO SYNDROME  . TIA  . CEREBRAL ANEURYSM  . ALLERGIC RHINITIS  . GERD  . DIVERTICULOSIS, COLON  . FATTY LIVER DISEASE  . FIBROMYALGIA  . PATENT FORAMEN OVALE  . MEDIASTINAL LYMPHADENOPATHY  . ICD  [employer]  Single chamber  . Sore throat  . Abnormal tympanic membrane  . Numbness of toes  . Palpitations  . Chest pain  . Colon cancer screening  . Post-menopausal  . Skin lesion of breast  . Non-ischemic cardiomyopathy  . Dyspnea on exertion   Past Medical History  Diagnosis Date  . Automatic implantable cardiac defibrillator in situ   . Paroxysmal ventricular tachycardia   . Ostium secundum type atrial septal defect   . Unspecified transient cerebral ischemia   . Takotsubo syndrome   . Otalgia, unspecified   . Other malaise and fatigue   . Unspecified vitamin D deficiency   . Headache   . ICD (implantable cardiac defibrillator), single, BSX   . Other pulmonary embolism and infarction   . Mitral valve disorders   . Other  chronic nonalcoholic liver disease   . Myalgia and myositis, unspecified   . Cerebral aneurysm, nonruptured   . Hypothyroidism   . Unspecified essential hypertension   . Other and unspecified hyperlipidemia   . Esophageal reflux   . Diverticulosis of colon (without mention of hemorrhage)   . Depressive disorder, not elsewhere classified   . Allergic rhinitis, cause unspecified   . Enlargement of lymph nodes   . Sarcoidosis   . Obstructive sleep apnea (adult) (pediatric)   . GI bleed   . PUD (peptic ulcer disease)    Past Surgical History  Procedure Date  . Partial hysterectomy     Fibroids  . Cerebral aneurysm repair 02/1999  . Knee arthroscopy     bilateral  . Cardiac defibrillator placement     Guidant  . Adenosine cardiolite 2003    neg, EF 50%  . Carotid dopplers 2006    neg  . Hospitalization 2004    GI bleed, PUD, diverticulosis (EGD,colonscopy)  . Hospitalization     PE, NSVT, s/p defib  . Abi 2006    normal   History  Substance Use Topics  . Smoking status: Former Smoker -- 1.0 packs/day for 15 years    Types: Cigarettes    Quit date: 08/20/1989  .  Smokeless tobacco: Not on file   Comment: Started at 14; less than 1 PPD  . Alcohol Use: Yes     Rare   Family History  Problem Relation Age of Onset  . Diabetes Mother   . Diabetes Brother   . Cardiomyopathy      Family history  . Other Brother     Thyroid problem  . Allergies Sister   . Heart disease Sister   . Heart disease Brother   . Heart disease Father   . Colon cancer Maternal Grandmother   . Lung cancer Maternal Grandfather    Allergies  Allergen Reactions  . Amitriptyline Hcl     REACTION: ? reaction  . Atorvastatin     REACTION: severe muscle pain  . Cetirizine Hcl     REACTION: sleepy  . Codeine     REACTION: itching  . Paroxetine     REACTION: ? reaction  . Rosuvastatin     REACTION: severe muscle pain  . Carbamazepine Rash    REACTION: ? reaction  . Dilantin (Phenytoin Sodium  Extended) Rash   Current Outpatient Prescriptions on File Prior to Visit  Medication Sig Dispense Refill  . acetaminophen (TYLENOL) 500 MG tablet Take 500 mg by mouth every 6 (six) hours as needed. For pain      . aspirin 81 MG tablet Take 81 mg by mouth daily.        [name] azelastine (ASTELIN) 137 MCG/SPRAY nasal spray Place 2 sprays into the nose 2 (two) times daily. Use in each nostril as directed  90 mL  3  . carvedilol (COREG) 6.25 MG tablet Take 1 tablet (6.25 mg total) by mouth 2 (two) times daily.  60 tablet  11  . diclofenac sodium (VOLTAREN) 1 % GEL Apply 1 application topically daily as needed. For pain      . hydrochlorothiazide (HYDRODIURIL) 25 MG tablet Take 0.5 tablets (12.5 mg total) by mouth daily.  45 tablet  3  . hydrocodone-acetaminophen (LORCET-HD) 5-500 MG per capsule Take 1 capsule by mouth 3 (three) times daily as needed. For pain      . latanoprost (XALATAN) 0.005 % ophthalmic solution Place 1 drop into both eyes at bedtime.      [name] levothyroxine (SYNTHROID, LEVOTHROID) 50 MCG tablet Take 1 tablet (50 mcg total) by mouth daily.  90 tablet  3  . naproxen sodium (ALEVE) 220 MG tablet Take 220 mg by mouth 2 (two) times daily as needed. For pain      . polyethylene glycol (MIRALAX / GLYCOLAX) packet Take 17 g by mouth daily as needed. For constipation      . potassium chloride (K-DUR) 10 MEQ tablet 1/2 tab po qd      . potassium chloride (K-DUR,KLOR-CON) 10 MEQ tablet Take 5 mEq by mouth daily.      . ramipril (ALTACE) 5 MG capsule Take 1 capsule (5 mg total) by mouth daily.  90 capsule  3  . simvastatin (ZOCOR) 40 MG tablet Take 20 mg by mouth at bedtime.      . traMADol (ULTRAM) 50 MG tablet Take 50 mg by mouth every 8 (eight) hours as needed. For pain      . zolpidem (AMBIEN) 10 MG tablet Take 10 mg by mouth at bedtime as needed. For sleep      . DISCONTD: pantoprazole (PROTONIX) 40 MG tablet Take 40 mg by mouth daily as needed.  Review of Systems Review  of Systems  Constitutional: Negative for fever, appetite change,  and unexpected weight change. pos for fatigue  Eyes: Negative for pain and visual disturbance.  Respiratory: Negative for cough and shortness of breath.   Cardiovascular: Negative for cp or palpitations    Gastrointestinal: Negative for nausea, diarrhea and constipation.  Genitourinary: Negative for urgency and frequency. neg for vaginal d/c or bleeding, pos for hot flashes and night sweats  Skin: Negative for pallor or rash   Neurological: Negative for weakness, light-headedness, numbness and headaches.  Hematological: Negative for adenopathy. Does not bruise/bleed easily.  Psychiatric/Behavioral: Negative for dysphoric mood. The patient is not nervous/anxious.  pos for poor sleep from hot flashes and pos for moodiness       Objective:   Physical Exam  Constitutional: She appears well-developed and well-nourished. No distress.  HENT:  Head: Normocephalic and atraumatic.  Mouth/Throat: Oropharynx is clear and moist.  Eyes: Conjunctivae and EOM are normal. Pupils are equal, round, and reactive to light. No scleral icterus.  Neck: Normal range of motion. Neck supple. No JVD present. Carotid bruit is not present. No thyromegaly present.  Cardiovascular: Normal rate and regular rhythm.  Exam reveals no gallop.   Pulmonary/Chest: Effort normal and breath sounds normal. No respiratory distress. She has no wheezes.  Abdominal: Soft. Bowel sounds are normal. She exhibits no distension, no abdominal bruit and no mass. There is no tenderness.  Musculoskeletal: She exhibits no edema.  Lymphadenopathy:    She has no cervical adenopathy.  Neurological: She is alert. She has normal reflexes. No cranial nerve deficit. She exhibits normal muscle tone. Coordination normal.  Skin: Skin is warm and dry. No rash noted. No erythema.  Psychiatric: She has a normal mood and affect.          Assessment & Plan:

## 2012-04-22 NOTE — Telephone Encounter (Signed)
Pt Dropped Off Attending Physicians Statement,Check For $25.00, She was asking if Dr.Klein could  Complete this for her, it was Taken to [facility] M/Klein He said he cannot Complete this and it needed to be  Completed by her Neurology physician, I called let pt Know and she Ok'd , she stated she will come pick Up Today.  04/22/12/KM

## 2012-04-29 ENCOUNTER — Encounter: Payer: Self-pay | Admitting: Pulmonary Disease

## 2012-04-29 ENCOUNTER — Ambulatory Visit (INDEPENDENT_AMBULATORY_CARE_PROVIDER_SITE_OTHER): Payer: Medicare Other | Admitting: Pulmonary Disease

## 2012-04-29 VITALS — BP 122/72 | HR 74 | Temp 97.5°F | Ht 66.5 in | Wt 199.6 lb

## 2012-04-29 DIAGNOSIS — R0989 Other specified symptoms and signs involving the circulatory and respiratory systems: Secondary | ICD-10-CM

## 2012-04-29 DIAGNOSIS — J471 Bronchiectasis with (acute) exacerbation: Secondary | ICD-10-CM | POA: Insufficient documentation

## 2012-04-29 DIAGNOSIS — J479 Bronchiectasis, uncomplicated: Secondary | ICD-10-CM

## 2012-04-29 DIAGNOSIS — Z23 Encounter for immunization: Secondary | ICD-10-CM

## 2012-04-29 NOTE — Assessment & Plan Note (Signed)
The patient continues to have dyspnea with heavier exertional activities.  She does not feel that it impacts her activities of daily living or even with moderate exercise.  Her chest CT is really not consistent with sarcoid at this time, though she may have had sarcoid in the past that has now become quiescent.  This is fairly common with this disease process.  It is hard for me to justify doing bronchoscopy with biopsy with the patient having mild disease on her CT chest, no major change in PFTs from 2010, and no symptoms except with heavy exertional activities.  She also has underlying cardiac disease which could explain some of her symptoms.  I have asked her to continue with weight loss, as well as her conditioning program.  I would like to see her back in 6 months, and if she feels that she is worsening, would consider doing bronchoscopy with biopsy.

## 2012-04-29 NOTE — Telephone Encounter (Signed)
Called Pt Today, No answer I am Mailing the Attending Physicians Statement,ROI,& $25.00  Check she made out to [employer] to her Home Address, I have Called Several Times and cannot  Get an Answer, I do NOT want to Hold Onto this Check . Being Placed In Mail today  04/29/12/Km

## 2012-04-29 NOTE — Progress Notes (Signed)
  Subjective:    Patient ID: [name], female    DOB: 14-Mar-1951, 61 y.o.   MRN: [id number]  HPI The patient comes in today for followup of her recent CT chest and also pulmonary function studies.  She has a history of mediastinal lymphadenopathy, presumed secondary to sarcoidosis.  The patient did not wish to pursue a tissue diagnosis since she had no significant PFT abnormality or symptoms.  She has been lost to followup for a few years, but recently returned complaining of dyspnea with primarily heavy exertion.  However, she is also been diagnosed in the interim with significant cardiac disease. Her Recent CT chest shows very mild lower lobe bronchiectasis, as well as a few patchy areas of scarring in both lung fields.  There is no groundglass infiltrate, no nodular infiltrates, and no mediastinal lymphadenopathy.  Her pulmonary function studies showed no airflow obstruction, very mild restriction, and a severe decrease in diffusion capacity that corrects with alveolar volume adjustment.  Her DLCO abnormality is most likely related to her cardiac dysfunction.  The patient did have a decrease in her total lung capacity from 2010.   Review of Systems  Constitutional: Negative for fever and unexpected weight change.  HENT: Positive for congestion, sore throat, rhinorrhea, sneezing, trouble swallowing, postnasal drip and sinus pressure. Negative for ear pain, nosebleeds and dental problem.   Eyes: Positive for redness and itching.  Respiratory: Positive for cough, shortness of breath and wheezing. Negative for chest tightness.   Cardiovascular: Positive for palpitations and leg swelling.  Gastrointestinal: Negative for nausea and vomiting.  Genitourinary: Negative for dysuria.  Musculoskeletal: Positive for joint swelling.  Skin: Negative for rash.  Neurological: Negative for headaches.  Hematological: Does not bruise/bleed easily.  Psychiatric/Behavioral: Positive for dysphoric mood.  The patient is nervous/anxious.        Objective:   Physical Exam Ow female in nad Nose without purulence or discharge noted. Neck without LN/tmg Chest with clear bs, no wheezing. Cor with rrr LE without edema, no cyanosis Alert and oriented, moves all 4.        Assessment & Plan:

## 2012-04-29 NOTE — Assessment & Plan Note (Signed)
The patient has mild lower lobe bronchiectasis on her recent CT scan, but nothing to suggest ongoing symptoms from this.

## 2012-04-29 NOTE — Patient Instructions (Addendum)
Will not pursue aggressive workup at this time since your testing does not show a big change from 2010.  Work on continued weight loss and conditioning. followup with me in 6mos, but call if you feel your breathing is worsening.

## 2012-05-21 ENCOUNTER — Encounter: Payer: Self-pay | Admitting: Family Medicine

## 2012-05-21 ENCOUNTER — Ambulatory Visit (INDEPENDENT_AMBULATORY_CARE_PROVIDER_SITE_OTHER): Payer: Medicare Other | Admitting: Family Medicine

## 2012-05-21 ENCOUNTER — Other Ambulatory Visit: Payer: Self-pay | Admitting: Neurology

## 2012-05-21 VITALS — BP 116/72 | HR 74 | Temp 98.0°F | Ht 66.5 in | Wt 200.5 lb

## 2012-05-21 DIAGNOSIS — I472 Ventricular tachycardia, unspecified: Secondary | ICD-10-CM

## 2012-05-21 DIAGNOSIS — I671 Cerebral aneurysm, nonruptured: Secondary | ICD-10-CM

## 2012-05-21 DIAGNOSIS — R42 Dizziness and giddiness: Secondary | ICD-10-CM

## 2012-05-21 DIAGNOSIS — G459 Transient cerebral ischemic attack, unspecified: Secondary | ICD-10-CM

## 2012-05-21 DIAGNOSIS — I428 Other cardiomyopathies: Secondary | ICD-10-CM

## 2012-05-21 DIAGNOSIS — I10 Essential (primary) hypertension: Secondary | ICD-10-CM

## 2012-05-21 NOTE — Progress Notes (Signed)
Subjective:    Patient ID: [name], female    DOB: 03/24/1951, 61 y.o.   MRN: [id number]  HPI Here to fill out disability forms for [employer]   She is having a lot of dizziness lately  Light headed- less than spinning Lots of sinus problems  Will have a scan tomorrow for Dr Sandria Manly  Good days and bad days -- worse since this weekend   No fever  Has runny nose   Vitals look good   She is still totally disabled from work Hx of CVA/ TIA/ and cerebral aneurysm- left her with slowed cognition Cannot do even sedentary sork Also cardiopathy and chronic sob from this  See [employer] paperwork for more details  Patient Active Problem List  Diagnosis  . HYPOTHYROIDISM  . UNSPECIFIED VITAMIN D DEFICIENCY  . HYPERLIPIDEMIA  . DEPRESSION  . SLEEP APNEA, OBSTRUCTIVE  . HYPERTENSION  . PULMONARY EMBOLISM  . MITRAL VALVE PROLAPSE  . VENTRICULAR TACHYCARDIA  . TAKOTSUBO SYNDROME  . TIA  . CEREBRAL ANEURYSM  . ALLERGIC RHINITIS  . GERD  . DIVERTICULOSIS, COLON  . FATTY LIVER DISEASE  . FIBROMYALGIA  . PATENT FORAMEN OVALE  . ICD  [employer]  Single chamber  . Sore throat  . Abnormal tympanic membrane  . Numbness of toes  . Palpitations  . Chest pain  . Colon cancer screening  . Post-menopausal  . Skin lesion of breast  . Non-ischemic cardiomyopathy  . Dyspnea on exertion  . Hot flashes  . Bronchiectasis without acute exacerbation   Past Medical History  Diagnosis Date  . Automatic implantable cardiac defibrillator in situ   . Paroxysmal ventricular tachycardia   . Ostium secundum type atrial septal defect   . Unspecified transient cerebral ischemia   . Takotsubo syndrome   . Otalgia, unspecified   . Other malaise and fatigue   . Unspecified vitamin D deficiency   . Headache   . ICD (implantable cardiac defibrillator), single, BSX   . Other pulmonary embolism and infarction   . Mitral valve disorders   . Other chronic nonalcoholic liver disease   .  Myalgia and myositis, unspecified   . Cerebral aneurysm, nonruptured   . Hypothyroidism   . Unspecified essential hypertension   . Other and unspecified hyperlipidemia   . Esophageal reflux   . Diverticulosis of colon (without mention of hemorrhage)   . Depressive disorder, not elsewhere classified   . Allergic rhinitis, cause unspecified   . Enlargement of lymph nodes   . Obstructive sleep apnea (adult) (pediatric)   . GI bleed   . PUD (peptic ulcer disease)    Past Surgical History  Procedure Date  . Partial hysterectomy     Fibroids  . Cerebral aneurysm repair 02/1999  . Knee arthroscopy     bilateral  . Cardiac defibrillator placement     Guidant  . Adenosine cardiolite 2003    neg, EF 50%  . Carotid dopplers 2006    neg  . Hospitalization 2004    GI bleed, PUD, diverticulosis (EGD,colonscopy)  . Hospitalization     PE, NSVT, s/p defib  . Abi 2006    normal   History  Substance Use Topics  . Smoking status: Former Smoker -- 1.0 packs/day for 15 years    Types: Cigarettes    Quit date: 08/20/1989  . Smokeless tobacco: Not on file   Comment: Started at 14; less than 1 PPD  . Alcohol Use: Yes  Rare   Family History  Problem Relation Age of Onset  . Diabetes Mother   . Diabetes Brother   . Cardiomyopathy      Family history  . Other Brother     Thyroid problem  . Allergies Sister   . Heart disease Sister   . Heart disease Brother   . Heart disease Father   . Colon cancer Maternal Grandmother   . Lung cancer Maternal Grandfather    Allergies  Allergen Reactions  . Amitriptyline Hcl     REACTION: ? reaction  . Atorvastatin     REACTION: severe muscle pain  . Cetirizine Hcl     REACTION: sleepy  . Codeine     REACTION: itching  . Paroxetine     REACTION: ? reaction  . Rosuvastatin     REACTION: severe muscle pain  . Carbamazepine Rash    REACTION: ? reaction  . Dilantin (Phenytoin Sodium Extended) Rash   Current Outpatient Prescriptions on  File Prior to Visit  Medication Sig Dispense Refill  . acetaminophen (TYLENOL) 500 MG tablet Take 500 mg by mouth every 6 (six) hours as needed. For pain      . aspirin 81 MG tablet Take 81 mg by mouth daily.        [name] azelastine (ASTELIN) 137 MCG/SPRAY nasal spray Place 2 sprays into the nose 2 (two) times daily. Use in each nostril as directed  90 mL  3  . carvedilol (COREG) 6.25 MG tablet Take 1 tablet (6.25 mg total) by mouth 2 (two) times daily.  60 tablet  11  . chlorpheniramine (CHLOR-TRIMETON) 4 MG tablet Take 4 mg by mouth 2 (two) times daily.      . diclofenac sodium (VOLTAREN) 1 % GEL Apply 1 application topically daily as needed. For pain      . hydrochlorothiazide (HYDRODIURIL) 25 MG tablet Take 0.5 tablets (12.5 mg total) by mouth daily.  45 tablet  3  . hydrocodone-acetaminophen (LORCET-HD) 5-500 MG per capsule Take 1 capsule by mouth 3 (three) times daily as needed. For pain      . latanoprost (XALATAN) 0.005 % ophthalmic solution Place 1 drop into both eyes at bedtime.      [name] levothyroxine (SYNTHROID, LEVOTHROID) 50 MCG tablet Take 1 tablet (50 mcg total) by mouth daily.  90 tablet  3  . naproxen sodium (ALEVE) 220 MG tablet Take 220 mg by mouth 2 (two) times daily as needed. For pain      . pantoprazole (PROTONIX) 40 MG tablet Take 1 tablet (40 mg total) by mouth daily as needed.  90 tablet  3  . polyethylene glycol (MIRALAX / GLYCOLAX) packet Take 17 g by mouth daily as needed. For constipation      . potassium chloride (K-DUR) 10 MEQ tablet 1/2 tab po qd      . ramipril (ALTACE) 5 MG capsule Take 1 capsule (5 mg total) by mouth daily.  90 capsule  3  . simvastatin (ZOCOR) 40 MG tablet Take 20 mg by mouth at bedtime.      . traMADol (ULTRAM) 50 MG tablet Take 50 mg by mouth every 8 (eight) hours as needed. For pain      . zolpidem (AMBIEN) 10 MG tablet Take 10 mg by mouth at bedtime as needed. For sleep      . DISCONTD: pantoprazole (PROTONIX) 40 MG tablet Take 40 mg by mouth  daily as needed.         No  current facility-administered medications on file prior to visit.     Review of Systems Review of Systems  Constitutional: Negative for fever, appetite change,  and unexpected weight change. pos for fatigue Eyes: Negative for pain and visual disturbance.  Respiratory: Negative for cough and pos for sob Cardiovascular: Negative for cp or palpitations   pos for sob on exertion Gastrointestinal: Negative for nausea, diarrhea and constipation.  Genitourinary: Negative for urgency and frequency.  Skin: Negative for pallor or rash   Neurological: Negative for weakness, numbness and headaches. pos for dizziness and slowed cognition Hematological: Negative for adenopathy. Does not bruise/bleed easily.  Psychiatric/Behavioral: Negative for dysphoric mood. The patient is not nervous/anxious.         Objective:   Physical Exam  Constitutional: She appears well-developed and well-nourished. No distress.  HENT:  Head: Normocephalic and atraumatic.  Mouth/Throat: Oropharynx is clear and moist.  Eyes: Conjunctivae normal and EOM are normal. Pupils are equal, round, and reactive to light. No scleral icterus.       Few beats of horiz nystagmus bilat   Neck: Normal range of motion. Neck supple. No JVD present. Carotid bruit is not present. No thyromegaly present.  Cardiovascular: Normal rate and regular rhythm.   Murmur heard. Pulmonary/Chest: Effort normal and breath sounds normal. No respiratory distress. She has no wheezes. She has no rales. She exhibits no tenderness.       No crackles   Abdominal: Soft. Bowel sounds are normal. She exhibits no distension and no abdominal bruit. There is no tenderness.  Lymphadenopathy:    She has no cervical adenopathy.  Neurological: She is alert. She has normal reflexes. No cranial nerve deficit. She exhibits normal muscle tone. Coordination normal.       Baseline slowed cognition Dizzy with slow wide based gait today  Skin:  Skin is warm and dry. No rash noted. No pallor.  Psychiatric: She has a normal mood and affect.          Assessment & Plan:

## 2012-05-21 NOTE — Patient Instructions (Signed)
I hope you feel better with dizziness Get your scan tomorrow as planned We did your disability paperwork today

## 2012-05-21 NOTE — Assessment & Plan Note (Signed)
This was treated and pt continues neuro f/u indefinitely She has chronic cognitive slowing and vision issues from this making her unable to work We filled out disability paperwork today

## 2012-05-21 NOTE — Assessment & Plan Note (Signed)
bp in fair control at this time  No changes needed  Disc lifstyle change with low sodium diet and exercise   

## 2012-05-21 NOTE — Assessment & Plan Note (Signed)
Pt remains chronically sob - but stable Filled out disability forms for [employer] today

## 2012-05-21 NOTE — Assessment & Plan Note (Addendum)
In past-takes asa  Has also had cva and aneurysm and PFO Is permanently unable to work due to cognitive slowing and vision changes

## 2012-05-22 ENCOUNTER — Ambulatory Visit
Admission: RE | Admit: 2012-05-22 | Discharge: 2012-05-22 | Disposition: A | Discharge status: Home / Self Care | Payer: Medicare Other | Source: Ambulatory Visit | Attending: Neurology | Admitting: Neurology

## 2012-05-22 DIAGNOSIS — R42 Dizziness and giddiness: Secondary | ICD-10-CM

## 2012-05-22 MED ORDER — IOHEXOL 300 MG/ML  SOLN
75.0000 mL | Freq: Once | INTRAMUSCULAR | Status: AC | PRN
  Administered 2012-05-22: 75 mL via INTRAVENOUS

## 2012-05-22 NOTE — Assessment & Plan Note (Signed)
No under control  Sees cardiology Disability forms filled out today

## 2012-06-04 ENCOUNTER — Other Ambulatory Visit: Payer: Self-pay | Admitting: *Deleted

## 2012-06-04 MED ORDER — ZOLPIDEM TARTRATE 10 MG PO TABS: 10.0000 mg | ORAL_TABLET | Freq: Every evening | ORAL | Status: DC | PRN

## 2012-06-04 NOTE — Telephone Encounter (Signed)
Faxed request from [facility] for 90 day supply, [facility] # [phone]

## 2012-06-04 NOTE — Telephone Encounter (Signed)
Px was printed to fax to [facility]- is in IN box thanks

## 2012-06-05 NOTE — Telephone Encounter (Signed)
Rx faxed to [facility] 

## 2012-06-10 ENCOUNTER — Other Ambulatory Visit: Payer: Self-pay | Admitting: *Deleted

## 2012-06-10 DIAGNOSIS — Z95810 Presence of automatic (implantable) cardiac defibrillator: Secondary | ICD-10-CM

## 2012-06-10 DIAGNOSIS — I428 Other cardiomyopathies: Secondary | ICD-10-CM

## 2012-06-10 DIAGNOSIS — I472 Ventricular tachycardia: Secondary | ICD-10-CM

## 2012-06-10 MED ORDER — CARVEDILOL 6.25 MG PO TABS: 6.2500 mg | ORAL_TABLET | Freq: Two times a day (BID) | ORAL | Status: DC

## 2012-06-12 ENCOUNTER — Encounter: Payer: Medicare Other | Admitting: *Deleted

## 2012-07-03 ENCOUNTER — Ambulatory Visit (INDEPENDENT_AMBULATORY_CARE_PROVIDER_SITE_OTHER): Payer: Medicare Other | Admitting: *Deleted

## 2012-07-03 ENCOUNTER — Encounter: Payer: Self-pay | Admitting: Internal Medicine

## 2012-07-03 DIAGNOSIS — I428 Other cardiomyopathies: Secondary | ICD-10-CM

## 2012-07-03 DIAGNOSIS — Z95810 Presence of automatic (implantable) cardiac defibrillator: Secondary | ICD-10-CM

## 2012-07-04 ENCOUNTER — Ambulatory Visit (INDEPENDENT_AMBULATORY_CARE_PROVIDER_SITE_OTHER): Payer: Medicare Other | Admitting: Family Medicine

## 2012-07-04 ENCOUNTER — Encounter: Payer: Self-pay | Admitting: Family Medicine

## 2012-07-04 VITALS — BP 126/78 | HR 76 | Temp 98.1°F | Ht 66.5 in | Wt 199.0 lb

## 2012-07-04 DIAGNOSIS — K5730 Diverticulosis of large intestine without perforation or abscess without bleeding: Secondary | ICD-10-CM

## 2012-07-04 MED ORDER — PANTOPRAZOLE SODIUM 40 MG PO TBEC: 40.0000 mg | DELAYED_RELEASE_TABLET | Freq: Every day | ORAL | Status: DC

## 2012-07-04 NOTE — Patient Instructions (Addendum)
Get citrucel powder fiber supplement and take it once daily with water as directed to see if it will help normalize stool habits and decrease symptoms from diverticulosis If your symptoms worsen- call and let me know, especially if you get a fever Avoid nuts and seeds  We will refer you to [facility] at check out

## 2012-07-04 NOTE — Progress Notes (Signed)
Subjective:    Patient ID: [name], female    DOB: 05-28-1951, 61 y.o.   MRN: [id number]  HPI Here with GI issues  Was recently constipated Then got some diarrhea with miralax Then both "sides" started hurting her  Now primarily on the R side - upper abd and side  Just a little nausea - every now and then  No vomiting No blood in stool , and no black stool  Eating makes no difference at all   She has hx of diverticulosis and also IBS   Still having diarrhea  No miralax since last week  Stools are very pasty in color   No aleve in 1 1/2 months  2 cortisone injections last month - knees feel much better  Going to the pool every day    Was eating a lot of salads - and backed off a bit   Dr Randa Evens is retiring and needs new GI- wants to get est with [facility]    Patient Active Problem List  Diagnosis  . HYPOTHYROIDISM  . UNSPECIFIED VITAMIN D DEFICIENCY  . HYPERLIPIDEMIA  . DEPRESSION  . SLEEP APNEA, OBSTRUCTIVE  . HYPERTENSION  . PULMONARY EMBOLISM  . MITRAL VALVE PROLAPSE  . VENTRICULAR TACHYCARDIA  . TAKOTSUBO SYNDROME  . TIA  . CEREBRAL ANEURYSM  . ALLERGIC RHINITIS  . GERD  . DIVERTICULOSIS, COLON  . FATTY LIVER DISEASE  . FIBROMYALGIA  . PATENT FORAMEN OVALE  . ICD  [employer]  Single chamber  . Sore throat  . Abnormal tympanic membrane  . Numbness of toes  . Palpitations  . Chest pain  . Colon cancer screening  . Post-menopausal  . Skin lesion of breast  . Non-ischemic cardiomyopathy  . Dyspnea on exertion  . Hot flashes  . Bronchiectasis without acute exacerbation   Past Medical History  Diagnosis Date  . Automatic implantable cardiac defibrillator in situ   . Paroxysmal ventricular tachycardia   . Ostium secundum type atrial septal defect   . Unspecified transient cerebral ischemia   . Takotsubo syndrome   . Otalgia, unspecified   . Other malaise and fatigue   . Unspecified vitamin D deficiency   . Headache   . ICD  (implantable cardiac defibrillator), single, BSX   . Other pulmonary embolism and infarction   . Mitral valve disorders   . Other chronic nonalcoholic liver disease   . Myalgia and myositis, unspecified   . Cerebral aneurysm, nonruptured   . Hypothyroidism   . Unspecified essential hypertension   . Other and unspecified hyperlipidemia   . Esophageal reflux   . Diverticulosis of colon (without mention of hemorrhage)   . Depressive disorder, not elsewhere classified   . Allergic rhinitis, cause unspecified   . Enlargement of lymph nodes   . Obstructive sleep apnea (adult) (pediatric)   . GI bleed   . PUD (peptic ulcer disease)    Past Surgical History  Procedure Date  . Partial hysterectomy     Fibroids  . Cerebral aneurysm repair 02/1999  . Knee arthroscopy     bilateral  . Cardiac defibrillator placement     [employer]  . Adenosine cardiolite 2003    neg, EF 50%  . Carotid dopplers 2006    neg  . Hospitalization 2004    GI bleed, PUD, diverticulosis (EGD,colonscopy)  . Hospitalization     PE, NSVT, s/p defib  . Abi 2006    normal   History  Substance Use Topics  .  Smoking status: Former Smoker -- 1.0 packs/day for 15 years    Types: Cigarettes    Quit date: 08/20/1989  . Smokeless tobacco: Not on file     Comment: Started at 14; less than 1 PPD  . Alcohol Use: Yes     Comment: Rare   Family History  Problem Relation Age of Onset  . Diabetes Mother   . Diabetes Brother   . Cardiomyopathy      Family history  . Other Brother     Thyroid problem  . Allergies Sister   . Heart disease Sister   . Heart disease Brother   . Heart disease Father   . Colon cancer Maternal Grandmother   . Lung cancer Maternal Grandfather    Allergies  Allergen Reactions  . Amitriptyline Hcl     REACTION: ? reaction  . Atorvastatin     REACTION: severe muscle pain  . Cetirizine Hcl     REACTION: sleepy  . Codeine     REACTION: itching  . Paroxetine     REACTION: ? reaction    . Rosuvastatin     REACTION: severe muscle pain  . Carbamazepine Rash    REACTION: ? reaction  . Dilantin (Phenytoin Sodium Extended) Rash   Current Outpatient Prescriptions on File Prior to Visit  Medication Sig Dispense Refill  . acetaminophen (TYLENOL) 500 MG tablet Take 500 mg by mouth every 6 (six) hours as needed. For pain      . aspirin 81 MG tablet Take 81 mg by mouth daily.        [name] azelastine (ASTELIN) 137 MCG/SPRAY nasal spray Place 2 sprays into the nose 2 (two) times daily. Use in each nostril as directed  90 mL  3  . carvedilol (COREG) 6.25 MG tablet Take 1 tablet (6.25 mg total) by mouth 2 (two) times daily.  180 tablet  2  . diclofenac sodium (VOLTAREN) 1 % GEL Apply 1 application topically daily as needed. For pain      . hydrochlorothiazide (HYDRODIURIL) 25 MG tablet Take 0.5 tablets (12.5 mg total) by mouth daily.  45 tablet  3  . hydrocodone-acetaminophen (LORCET-HD) 5-500 MG per capsule Take 1 capsule by mouth 3 (three) times daily as needed. For pain      . latanoprost (XALATAN) 0.005 % ophthalmic solution Place 1 drop into both eyes at bedtime.      [name] levothyroxine (SYNTHROID, LEVOTHROID) 50 MCG tablet Take 1 tablet (50 mcg total) by mouth daily.  90 tablet  3  . naproxen sodium (ALEVE) 220 MG tablet Take 220 mg by mouth 2 (two) times daily as needed. For pain      . polyethylene glycol (MIRALAX / GLYCOLAX) packet Take 17 g by mouth daily as needed. For constipation      . ramipril (ALTACE) 5 MG capsule Take 1 capsule (5 mg total) by mouth daily.  90 capsule  3  . simvastatin (ZOCOR) 40 MG tablet Take 20 mg by mouth at bedtime.      . traMADol (ULTRAM) 50 MG tablet Take 50 mg by mouth every 8 (eight) hours as needed. For pain      . zolpidem (AMBIEN) 10 MG tablet Take 1 tablet (10 mg total) by mouth at bedtime as needed. For sleep  90 tablet  1  . [DISCONTINUED] pantoprazole (PROTONIX) 40 MG tablet Take 1 tablet (40 mg total) by mouth daily as needed.  90 tablet  3  .  potassium chloride (K-DUR)  10 MEQ tablet 1/2 tab po qd        Review of Systems Review of Systems  Constitutional: Negative for fever, appetite change, fatigue and unexpected weight change.  Eyes: Negative for pain and visual disturbance.  Respiratory: Negative for cough and shortness of breath.   Cardiovascular: Negative for cp or palpitations    Gastrointestinal: Negative for nausea, vomiting or food intolerances, pos for gas, cramping and intermittent diarrhea/ constipation, also neg for blood in stool  Genitourinary: Negative for urgency and frequency.  Skin: Negative for pallor or rash   Neurological: Negative for weakness, light-headedness, numbness and headaches.  Hematological: Negative for adenopathy. Does not bruise/bleed easily.  Psychiatric/Behavioral: Negative for dysphoric mood. The patient is not nervous/anxious.         Objective:   Physical Exam  Constitutional: She appears well-developed and well-nourished. No distress.       obese and well appearing   HENT:  Head: Normocephalic and atraumatic.  Mouth/Throat: Oropharynx is clear and moist.  Eyes: Conjunctivae normal and EOM are normal. Pupils are equal, round, and reactive to light. No scleral icterus.  Neck: Normal range of motion. Neck supple. No thyromegaly present.  Cardiovascular: Normal rate and regular rhythm.   Pulmonary/Chest: Effort normal and breath sounds normal.  Abdominal: Soft. Bowel sounds are normal. She exhibits no distension and no mass. There is tenderness. There is no rebound and no guarding.       Mild tenderness bilat LQ No upper quad tenderness Neg murphy sign  No M  Nl bs    Musculoskeletal: She exhibits no edema and no tenderness.       No cva tenderness   Lymphadenopathy:    She has no cervical adenopathy.  Neurological: She is alert.  Skin: Skin is warm and dry. No rash noted. No pallor.  Psychiatric: She has a normal mood and affect.          Assessment & Plan:

## 2012-07-04 NOTE — Assessment & Plan Note (Signed)
Pt is having more issues with IBS type symptoms lately -and pain/ gas  No fever or indication of diverticulitis Will try citrucel daily - to help normalize bowel habits  Enc to call if no imp or if any fever or worse pain  Handout given on diverticulosis - reminded to avoid nuts /seeds Also ref to GI since her current one is retiring

## 2012-07-08 ENCOUNTER — Encounter: Payer: Self-pay | Admitting: Internal Medicine

## 2012-07-08 NOTE — Progress Notes (Signed)
Patient sent notes to [place] from Dr Randa Evens and requested to switch to one of our physician's since Dr Randa Evens is apparently retiring. Dr Juanda Chance has reviewed records and feels that a younger physician in our practice would better serve the patient than she. Notes forwarded to Doc of the Day today, Dr Erick Blinks.

## 2012-07-16 ENCOUNTER — Encounter: Payer: Self-pay | Admitting: Internal Medicine

## 2012-07-18 LAB — REMOTE ICD DEVICE
DEV-0020ICD: NEGATIVE
DEVICE MODEL ICD: 266301
HV IMPEDENCE: 57 Ohm
PACEART VT: 0
RV LEAD AMPLITUDE: 12.7 mv
TZAT-0001FASTVT: 1
TZAT-0001SLOWVT: 1
TZAT-0002FASTVT: NEGATIVE
TZAT-0002SLOWVT: NEGATIVE
TZAT-0002SLOWVT: NEGATIVE
TZAT-0018FASTVT: NEGATIVE
TZAT-0018SLOWVT: NEGATIVE
TZON-0003FASTVT: 285.7 ms
TZON-0003SLOWVT: 352.9 ms
TZST-0001FASTVT: 5
TZST-0001FASTVT: 6
TZST-0001SLOWVT: 3
TZST-0001SLOWVT: 5
TZST-0002SLOWVT: NEGATIVE
TZST-0002SLOWVT: NEGATIVE
TZST-0003FASTVT: 23 J
TZST-0003FASTVT: 41 J
TZST-0003FASTVT: 41 J
VENTRICULAR PACING ICD: 0 pct

## 2012-07-31 ENCOUNTER — Ambulatory Visit (INDEPENDENT_AMBULATORY_CARE_PROVIDER_SITE_OTHER): Payer: Medicare Other | Admitting: Family Medicine

## 2012-07-31 ENCOUNTER — Telehealth: Payer: Self-pay

## 2012-07-31 ENCOUNTER — Ambulatory Visit: Payer: Medicare Other | Admitting: Family Medicine

## 2012-07-31 ENCOUNTER — Encounter: Payer: Self-pay | Admitting: Family Medicine

## 2012-07-31 VITALS — BP 122/70 | HR 71 | Temp 98.2°F | Wt 199.8 lb

## 2012-07-31 DIAGNOSIS — K921 Melena: Secondary | ICD-10-CM

## 2012-07-31 LAB — CBC WITH DIFFERENTIAL/PLATELET
Eosinophils Relative: 2 % (ref 0.0–5.0)
HCT: 37.2 % (ref 36.0–46.0)
Hemoglobin: 12.1 g/dL (ref 12.0–15.0)
Lymphs Abs: 1 10*3/uL (ref 0.7–4.0)
MCV: 89.9 fl (ref 78.0–100.0)
Monocytes Absolute: 0.3 10*3/uL (ref 0.1–1.0)
Monocytes Relative: 7.2 % (ref 3.0–12.0)
Neutro Abs: 3.3 10*3/uL (ref 1.4–7.7)
Platelets: 210 10*3/uL (ref 150.0–400.0)
RDW: 13.5 % (ref 11.5–14.6)
WBC: 4.8 10*3/uL (ref 4.5–10.5)

## 2012-07-31 NOTE — Progress Notes (Signed)
Rectal bleeding this AM.  She felt like she had fecal urgency this AM than had a shiny purple BM.  H/o GIB 9 years ago.  She held her ASA this AM. No black stools.  No BRBPR.  Some lower abd pain but this isn't acute in onset.  Prev diarrhea is resolved.  Some hard stools recently.  No fevers.  H/o diverticulosis.  H/o GERD.  Some nausea last night.  On protonix episodically, not consistently.  Not taking aleve much at in the last few months.  Mildly light headed on standing but no syncope or CP.  .    Meds, vitals, and allergies reviewed.   ROS: See HPI.  Otherwise, noncontributory.  nad nact Mmm rrr ctab abd soft, very minimally ttp in RLQ and LLQ, no rebound Normal BS Ext well perfused.

## 2012-07-31 NOTE — Telephone Encounter (Signed)
Pt already scheduled to see Dr Para March today at 3 pm; 2:30 AM this morning pt had constipated stool with blood attached to stool not in toilet bowl water. Pt not bleeding now and has appt with GI 08/08/12. Pt having abdominal discomfort still (saw Dr Milinda Antis in 06/2012). Pt is not actively bleeding but very concerned about seeing blood in early AM. Pt wanted to know if could have any testing prior to appt. Changed appt to today at 12:15. Pt verbalized understanding and appreciated sooner appt.

## 2012-07-31 NOTE — Assessment & Plan Note (Addendum)
Stop aleve, use protonix daily, hold ASA today and tomorrow.  Restart if no more bleeding.  See instructions.  Check CBC today.  Would hold on abx treatment for now.  Doesn't appear to be typical for diverticulitis.  I would like to see her WBC before considering abx given the benign exam today.  Rectal exam deferred as it wouldn't likely change mgmt; same with stool heme.  She agrees. She has fu with GI pending.

## 2012-07-31 NOTE — Patient Instructions (Addendum)
Go to the lab on the way out.  We'll contact you with your lab report.  Stop the aleve and aspirin for now. Take protonix daily.  If you get very light headed, have profound bleeding, or get chest pain, then go to the ER.  Take care.

## 2012-08-07 ENCOUNTER — Encounter: Payer: Self-pay | Admitting: Internal Medicine

## 2012-08-08 ENCOUNTER — Encounter: Payer: Self-pay | Admitting: Internal Medicine

## 2012-08-08 ENCOUNTER — Ambulatory Visit (INDEPENDENT_AMBULATORY_CARE_PROVIDER_SITE_OTHER): Payer: Medicare Other | Admitting: Internal Medicine

## 2012-08-08 VITALS — BP 124/68 | HR 64 | Ht 66.9 in | Wt 201.0 lb

## 2012-08-08 DIAGNOSIS — K5900 Constipation, unspecified: Secondary | ICD-10-CM

## 2012-08-08 DIAGNOSIS — Z1211 Encounter for screening for malignant neoplasm of colon: Secondary | ICD-10-CM

## 2012-08-08 DIAGNOSIS — K625 Hemorrhage of anus and rectum: Secondary | ICD-10-CM

## 2012-08-08 MED ORDER — PEG-KCL-NACL-NASULF-NA ASC-C 100 G PO SOLR: 1.0000 | Freq: Once | ORAL | Status: DC

## 2012-08-08 MED ORDER — ALIGN PO CAPS: 1.0000 | ORAL_CAPSULE | Freq: Every day | ORAL | Status: DC

## 2012-08-08 NOTE — Patient Instructions (Addendum)
You have been scheduled for a colonoscopy with propofol. Please follow written instructions given to you at your visit today.  Please pick up your prep kit at the pharmacy within the next 1-3 days. If you use inhalers (even only as needed) or a CPAP machine, please bring them with you on the day of your procedure.  We have sent the following medications to your pharmacy for you to pick up at your convenience: Moviprep; you were given instructions at your office visit today. Continue taking miralax every other day.  We have given you samples of Align. This puts good bacteria back into your colon. You should take 1 capsule by mouth once daily. If this works well for you, it can be purchased over the counter.

## 2012-08-08 NOTE — Progress Notes (Signed)
Patient ID: [name], female   DOB: [birth year], 61 y.o.   MRN: [id number]  SUBJECTIVE: HPI [name] is a 61 yo female with PMH of ventricular tachycardia status post ICD placement, diverticulosis of the colon with history of diverticular hemorrhage, remote history of PUD, GERD, who seen in consultation at the request of Dr. Milinda Antis for evaluation of rectal bleeding. The patient reports a long history of off and on rectal bleeding, but none in many years. In the past her bleeding could be contributed to hemorrhoids, and also an episode of diverticular hemorrhage requiring hospitalization. She reports about a week ago she passed a dark purplish blood clot after wiping. She reports this was an isolated event and has not recurred. She reports a long history of constipation and she has been using MiraLAX to help. She has tried it as many as 4 times a day, but currently is taking it daily. If she is not taking this medication she is having bowel movements every 3-4 days. At times she has stools which are too loose on MiraLAX. She denies nausea and vomiting. She has been active and working out at a [employer]. She reports occasional lower abdominal pain which is better after bowel movement. No fevers or chills.  Review of Systems  As per history of present illness, otherwise negative   Past Medical History  Diagnosis Date  . Automatic implantable cardiac defibrillator in situ   . Paroxysmal ventricular tachycardia   . Ostium secundum type atrial septal defect   . Unspecified transient cerebral ischemia   . Takotsubo syndrome   . Otalgia, unspecified   . Other malaise and fatigue   . Unspecified vitamin D deficiency   . Headache   . ICD (implantable cardiac defibrillator), single, BSX   . Other pulmonary embolism and infarction   . Mitral valve disorders   . Other chronic nonalcoholic liver disease   . Myalgia and myositis, unspecified   . Cerebral aneurysm, nonruptured   .  Hypothyroidism   . Unspecified essential hypertension   . Other and unspecified hyperlipidemia   . Esophageal reflux   . Diverticulosis of colon (without mention of hemorrhage)   . Depressive disorder, not elsewhere classified   . Allergic rhinitis, cause unspecified   . Enlargement of lymph nodes   . Obstructive sleep apnea (adult) (pediatric)   . GI bleed   . PUD (peptic ulcer disease)     Current Outpatient Prescriptions  Medication Sig Dispense Refill  . acetaminophen (TYLENOL) 500 MG tablet Take 500 mg by mouth every 6 (six) hours as needed. For pain      . aspirin 81 MG tablet Take 81 mg by mouth daily.        [name] azelastine (ASTELIN) 137 MCG/SPRAY nasal spray Place 2 sprays into the nose 2 (two) times daily. Use in each nostril as directed  90 mL  3  . carvedilol (COREG) 6.25 MG tablet Take 1 tablet (6.25 mg total) by mouth 2 (two) times daily.  180 tablet  2  . diclofenac sodium (VOLTAREN) 1 % GEL Apply 1 application topically daily as needed. For pain      . hydrochlorothiazide (HYDRODIURIL) 25 MG tablet Take 0.5 tablets (12.5 mg total) by mouth daily.  45 tablet  3  . hydrocodone-acetaminophen (LORCET-HD) 5-500 MG per capsule Take 1 capsule by mouth 3 (three) times daily as needed. For pain      . latanoprost (XALATAN) 0.005 % ophthalmic solution Place 1 drop into  both eyes at bedtime.      [name] levothyroxine (SYNTHROID, LEVOTHROID) 50 MCG tablet Take 1 tablet (50 mcg total) by mouth daily.  90 tablet  3  . pantoprazole (PROTONIX) 40 MG tablet Take 1 tablet (40 mg total) by mouth daily.  90 tablet  3  . polyethylene glycol (MIRALAX / GLYCOLAX) packet Take 17 g by mouth daily as needed. For constipation      . potassium chloride (K-DUR) 10 MEQ tablet 1/2 tab po qd      . ramipril (ALTACE) 5 MG capsule Take 1 capsule (5 mg total) by mouth daily.  90 capsule  3  . simvastatin (ZOCOR) 40 MG tablet Take 20 mg by mouth at bedtime.      . traMADol (ULTRAM) 50 MG tablet Take 50 mg by mouth  every 8 (eight) hours as needed. For pain      . zolpidem (AMBIEN) 10 MG tablet Take 1 tablet (10 mg total) by mouth at bedtime as needed. For sleep  90 tablet  1    Allergies  Allergen Reactions  . Amitriptyline Hcl     REACTION: ? reaction  . Atorvastatin     REACTION: severe muscle pain  . Cetirizine Hcl     REACTION: sleepy  . Codeine     REACTION: itching  . Paroxetine     REACTION: ? reaction  . Rosuvastatin     REACTION: severe muscle pain  . Carbamazepine Rash    REACTION: ? reaction  . Dilantin (Phenytoin Sodium Extended) Rash    Family History  Problem Relation Age of Onset  . Diabetes Mother   . Diabetes Brother   . Cardiomyopathy      Family history  . Other Brother     Thyroid problem  . Allergies Sister   . Heart disease Sister   . Heart disease Brother   . Heart disease Father   . Colon cancer Maternal Grandmother   . Lung cancer Maternal Grandfather     History  Substance Use Topics  . Smoking status: Former Smoker -- 1.0 packs/day for 15 years    Types: Cigarettes    Quit date: 08/20/1989  . Smokeless tobacco: Never Used     Comment: Started at 14; less than 1 PPD  . Alcohol Use: Yes     Comment: Rare    OBJECTIVE: BP 124/68  Pulse 64  Ht 5' 6.9" (1.699 m)  Wt 201 lb (91.173 kg)  BMI 31.58 kg/m2 Constitutional: Well-developed and well-nourished. No distress. HEENT: Normocephalic and atraumatic. Oropharynx is clear and moist. No oropharyngeal exudate. Conjunctivae are normal. No scleral icterus. Neck: Neck supple. Trachea midline. Cardiovascular: Normal rate, regular rhythm and intact distal pulses.  Pulmonary/chest: Effort normal and breath sounds normal. No wheezing, rales or rhonchi. Abdominal: Soft, mild lower abdominal pain without rebound or guarding, nondistended. Bowel sounds active throughout. Small umbilical hernia Extremities: no clubbing, cyanosis, or edema Lymphadenopathy: No cervical adenopathy noted. Neurological: Alert  and oriented to person place and time. Skin: Skin is warm and dry. No rashes noted. Psychiatric: Normal mood and affect. Behavior is normal.  Labs and Imaging -- CBC    Component Value Date/Time   WBC 4.8 07/31/2012 1320   WBC 3.3* 03/13/2007 1249   RBC 4.13 07/31/2012 1320   HGB 12.1 07/31/2012 1320   HGB 11.7 03/13/2007 1249   HCT 37.2 07/31/2012 1320   HCT 35.1 03/13/2007 1249   PLT 210.0 07/31/2012 1320   PLT 199 03/13/2007  1249   MCV 89.9 07/31/2012 1320   MCV 85 03/13/2007 1249   MCH 28.4 03/13/2007 1249   MCHC 32.5 07/31/2012 1320   MCHC 33.3 03/13/2007 1249   RDW 13.5 07/31/2012 1320   RDW 12.7 03/13/2007 1249   LYMPHSABS 1.0 07/31/2012 1320   LYMPHSABS 1.0 03/13/2007 1249   MONOABS 0.3 07/31/2012 1320   EOSABS 0.1 07/31/2012 1320   EOSABS 0.1 03/13/2007 1249   BASOSABS 0.0 07/31/2012 1320   BASOSABS 0.0 03/13/2007 1249    CMP     Component Value Date/Time   NA 139 02/19/2012 1453   K 3.9 02/19/2012 1453   CL 101 02/19/2012 1453   CO2 31 02/19/2012 1453   GLUCOSE 81 02/19/2012 1453   BUN 16 02/19/2012 1453   CREATININE 0.8 02/19/2012 1453   CALCIUM 10.0 02/19/2012 1453   PROT 7.3 01/03/2012 1120   ALBUMIN 4.0 01/03/2012 1120   AST 32 01/03/2012 1120   ALT 32 01/03/2012 1120   ALKPHOS 98 01/03/2012 1120   BILITOT 0.3 01/03/2012 1120   GFRNONAA 93.71 12/20/2009 1254   GFRAA 94 08/17/2008 1044    EGD 03/30/2003, Dr. Alonna Minium small gastric ulcer. Colonoscopy 03/31/2003, Dr. Denice Bors, probably the source of her bleeding. Excellent prep. Exam to the IC valve Flexible sigmoidoscopy 06/02/2007 - Dr. Junius Creamer polyps, multiple diverticuli, internal hemorrhoids  ASSESSMENT AND PLAN: 61 yo female with PMH of ventricular tachycardia status post ICD placement, diverticulosis of the colon with history of diverticular hemorrhage, remote history of PUD, GERD, who seen in consultation at the request of Dr. Milinda Antis for evaluation of rectal bleeding.  1.  Rectal bleed  -- the patient had an isolated episode of rectal bleeding, which could of been hemorrhoidal or less likely diverticulosis related.  Her hemoglobin is stable which is reassuring. It has been nearly 10 years since her last screening colonoscopy, and given her recent bleeding, I recommended proceeding to colonoscopy. We discussed the test including the risks and benefits and she is agreeable to proceed. She is asked to let us know if she has any recurrent bleeding prior to this examination.  2.  Constipation -- long standing history it seems that her stools are a bit too loose on daily MiraLAX. I recommended MiraLAX every other day and the addition of align one capsule daily as a probiotic. She can titrate the MiraLAX as necessary to help keep her bowel habits are regular and avoid constipation to

## 2012-08-19 ENCOUNTER — Encounter: Payer: Self-pay | Admitting: Family Medicine

## 2012-08-19 ENCOUNTER — Ambulatory Visit (INDEPENDENT_AMBULATORY_CARE_PROVIDER_SITE_OTHER): Payer: Medicare Other | Admitting: Family Medicine

## 2012-08-19 VITALS — BP 108/66 | HR 80 | Temp 98.3°F | Ht 66.75 in | Wt 198.2 lb

## 2012-08-19 DIAGNOSIS — R05 Cough: Secondary | ICD-10-CM

## 2012-08-19 DIAGNOSIS — J209 Acute bronchitis, unspecified: Secondary | ICD-10-CM

## 2012-08-19 DIAGNOSIS — R6883 Chills (without fever): Secondary | ICD-10-CM

## 2012-08-19 MED ORDER — ZOLPIDEM TARTRATE 10 MG PO TABS: 10.0000 mg | ORAL_TABLET | Freq: Every evening | ORAL | Status: DC | PRN

## 2012-08-19 MED ORDER — AMOXICILLIN-POT CLAVULANATE 875-125 MG PO TABS: 1.0000 | ORAL_TABLET | Freq: Two times a day (BID) | ORAL | Status: DC

## 2012-08-19 MED ORDER — HYDROCOD POLST-CHLORPHEN POLST 10-8 MG/5ML PO LQCR: 5.0000 mL | Freq: Two times a day (BID) | ORAL | Status: DC | PRN

## 2012-08-19 NOTE — Assessment & Plan Note (Signed)
Suspect viral - but given px for augmentin to take if symptoms worsen over the holiday (pt has chronic heart/ lung issues) Exam is reassuring without wheeze  Disc symptomatic care - see instructions on AVS  tussionex prn cough-will not mix with ambien  Update if not starting to improve in a week or if worsening

## 2012-08-19 NOTE — Progress Notes (Signed)
Subjective:    Patient ID: [name], female    DOB: 1951/01/31, 61 y.o.   MRN: [id number]  HPI Here for uri symptoms Started sat  Runny and stuffy nose and cough  Chills and sweats -- temp 99.5 at home   Ears hurt/ throat was sore   Grandchildren - were sick also - unsure what they had   Cough is dry  Taking mucinex for 2 days Also taking tylenol and  Had some old hydrocodone-chloropheniram susp- helps a little bit   Needs refil for ambien   Patient Active Problem List  Diagnosis  . HYPOTHYROIDISM  . UNSPECIFIED VITAMIN D DEFICIENCY  . HYPERLIPIDEMIA  . DEPRESSION  . SLEEP APNEA, OBSTRUCTIVE  . HYPERTENSION  . PULMONARY EMBOLISM  . MITRAL VALVE PROLAPSE  . VENTRICULAR TACHYCARDIA  . TAKOTSUBO SYNDROME  . TIA  . CEREBRAL ANEURYSM  . ALLERGIC RHINITIS  . GERD  . DIVERTICULOSIS, COLON  . FATTY LIVER DISEASE  . FIBROMYALGIA  . PATENT FORAMEN OVALE  . ICD  [employer]  Single chamber  . Abnormal tympanic membrane  . Numbness of toes  . Palpitations  . Chest pain  . Colon cancer screening  . Post-menopausal  . Skin lesion of breast  . Non-ischemic cardiomyopathy  . Dyspnea on exertion  . Hot flashes  . Bronchiectasis without acute exacerbation  . Blood in stool   Past Medical History  Diagnosis Date  . Automatic implantable cardiac defibrillator in situ   . Paroxysmal ventricular tachycardia   . Ostium secundum type atrial septal defect   . Unspecified transient cerebral ischemia   . Takotsubo syndrome   . Otalgia, unspecified   . Other malaise and fatigue   . Unspecified vitamin D deficiency   . Headache   . ICD (implantable cardiac defibrillator), single, BSX   . Other pulmonary embolism and infarction   . Mitral valve disorders   . Other chronic nonalcoholic liver disease   . Myalgia and myositis, unspecified   . Cerebral aneurysm, nonruptured   . Hypothyroidism   . Unspecified essential hypertension   . Other and unspecified  hyperlipidemia   . Esophageal reflux   . Diverticulosis of colon (without mention of hemorrhage)   . Depressive disorder, not elsewhere classified   . Allergic rhinitis, cause unspecified   . Enlargement of lymph nodes   . Obstructive sleep apnea (adult) (pediatric)   . GI bleed   . PUD (peptic ulcer disease)    Past Surgical History  Procedure Date  . Partial hysterectomy     Fibroids  . Cerebral aneurysm repair 02/1999  . Knee arthroscopy     bilateral  . Cardiac defibrillator placement     [employer]  . Adenosine cardiolite 2003    neg, EF 50%  . Carotid dopplers 2006    neg  . Hospitalization 2004    GI bleed, PUD, diverticulosis (EGD,colonscopy)  . Hospitalization     PE, NSVT, s/p defib  . Abi 2006    normal   History  Substance Use Topics  . Smoking status: Former Smoker -- 1.0 packs/day for 15 years    Types: Cigarettes    Quit date: 08/20/1989  . Smokeless tobacco: Never Used     Comment: Started at 14; less than 1 PPD  . Alcohol Use: Yes     Comment: Rare   Family History  Problem Relation Age of Onset  . Diabetes Mother   . Diabetes Brother   . Cardiomyopathy  Family history  . Other Brother     Thyroid problem  . Allergies Sister   . Heart disease Sister   . Heart disease Brother   . Heart disease Father   . Colon cancer Maternal Grandmother   . Lung cancer Maternal Grandfather    Allergies  Allergen Reactions  . Amitriptyline Hcl     REACTION: ? reaction  . Atorvastatin     REACTION: severe muscle pain  . Cetirizine Hcl     REACTION: sleepy  . Codeine     REACTION: itching  . Paroxetine     REACTION: ? reaction  . Rosuvastatin     REACTION: severe muscle pain  . Carbamazepine Rash    REACTION: ? reaction  . Dilantin (Phenytoin Sodium Extended) Rash   Current Outpatient Prescriptions on File Prior to Visit  Medication Sig Dispense Refill  . acetaminophen (TYLENOL) 500 MG tablet Take 500 mg by mouth every 6 (six) hours as needed.  For pain      . aspirin 81 MG tablet Take 81 mg by mouth daily.        [name] azelastine (ASTELIN) 137 MCG/SPRAY nasal spray Place 2 sprays into the nose 2 (two) times daily. Use in each nostril as directed  90 mL  3  . bifidobacterium infantis (ALIGN) capsule Take 1 capsule by mouth daily.  14 capsule  0  . carvedilol (COREG) 6.25 MG tablet Take 1 tablet (6.25 mg total) by mouth 2 (two) times daily.  180 tablet  2  . diclofenac sodium (VOLTAREN) 1 % GEL Apply 1 application topically daily as needed. For pain      . hydrochlorothiazide (HYDRODIURIL) 25 MG tablet Take 0.5 tablets (12.5 mg total) by mouth daily.  45 tablet  3  . hydrocodone-acetaminophen (LORCET-HD) 5-500 MG per capsule Take 1 capsule by mouth 3 (three) times daily as needed. For pain      . latanoprost (XALATAN) 0.005 % ophthalmic solution Place 1 drop into both eyes at bedtime.      [name] levothyroxine (SYNTHROID, LEVOTHROID) 50 MCG tablet Take 1 tablet (50 mcg total) by mouth daily.  90 tablet  3  . pantoprazole (PROTONIX) 40 MG tablet Take 1 tablet (40 mg total) by mouth daily.  90 tablet  3  . peg 3350 powder (MOVIPREP) 100 G SOLR Take 1 kit (100 g total) by mouth once.  1 kit  0  . polyethylene glycol (MIRALAX / GLYCOLAX) packet Take 17 g by mouth daily as needed. For constipation      . potassium chloride (K-DUR) 10 MEQ tablet 1/2 tab po qd      . ramipril (ALTACE) 5 MG capsule Take 1 capsule (5 mg total) by mouth daily.  90 capsule  3  . simvastatin (ZOCOR) 40 MG tablet Take 20 mg by mouth at bedtime.      . traMADol (ULTRAM) 50 MG tablet Take 50 mg by mouth every 8 (eight) hours as needed. For pain      . zolpidem (AMBIEN) 10 MG tablet Take 1 tablet (10 mg total) by mouth at bedtime as needed. For sleep  90 tablet  1      Review of Systems Review of Systems  Constitutional: Negative for , appetite change,  and unexpected weight change.  ENT pos for congestion and rhinorrhea and st , neg for sinus pain  Eyes: Negative for pain  and visual disturbance.  Respiratory: Negative for wheeze and shortness of breath.   Cardiovascular:  Negative for cp or palpitations    Gastrointestinal: Negative for nausea, diarrhea and constipation.  Genitourinary: Negative for urgency and frequency.  Skin: Negative for pallor or rash   Neurological: Negative for weakness, light-headedness, numbness and headaches.  Hematological: Negative for adenopathy. Does not bruise/bleed easily.  Psychiatric/Behavioral: Negative for dysphoric mood. The patient is not nervous/anxious.         Objective:   Physical Exam  Constitutional: She appears well-developed and well-nourished. No distress.       obese and well appearing   HENT:  Head: Normocephalic and atraumatic.  Right Ear: External ear normal.  Left Ear: External ear normal.  Mouth/Throat: Oropharynx is clear and moist. No oropharyngeal exudate.       Nares are injected and congested  No sinus tenderness  Eyes: Conjunctivae normal and EOM are normal. Pupils are equal, round, and reactive to light. Right eye exhibits no discharge. Left eye exhibits no discharge. No scleral icterus.  Neck: Normal range of motion. Neck supple. No JVD present. Carotid bruit is not present. No thyromegaly present.  Cardiovascular: Normal rate, regular rhythm, normal heart sounds and intact distal pulses.  Exam reveals no gallop.   Pulmonary/Chest: Effort normal and breath sounds normal. No respiratory distress. She has no wheezes. She has no rales. She exhibits no tenderness.       Harsh bs occ isolated rhonchi No wheeze   Abdominal: She exhibits no abdominal bruit.  Lymphadenopathy:    She has no cervical adenopathy.  Neurological: She is alert. She has normal reflexes.  Skin: Skin is warm and dry. No rash noted. No erythema. No pallor.  Psychiatric: She has a normal mood and affect.          Assessment & Plan:

## 2012-08-19 NOTE — Patient Instructions (Addendum)
Drink lots of fluids and get rest  mucinex as needed If worse- fill px for augmentin (antibiotic) Use the cough medicine with caution of sedation - and do take with the ambien  Update if not starting to improve in a week or if worsening

## 2012-09-01 ENCOUNTER — Telehealth: Payer: Self-pay | Admitting: Internal Medicine

## 2012-09-01 NOTE — Telephone Encounter (Signed)
EF is 35-40% and thus she should be okay for procedure in [facility] The RLQ pain, in which Dr. Lorin Picket was suspicious for presumed diverticulitis, warrants attention. RLQ when she was seen in clinic was not a major component of her symptoms. Given the possibility of diverticulitis, I recommend CT scan abd/pelvis with contrast for further evaluation If she does have diverticulitis, then ABX will be needed and the procedure (colonoscopy) will need to be delayed

## 2012-09-01 NOTE — Telephone Encounter (Signed)
Informed pt of Dr Lauro Franklin recommendations and she r/s her COLON to 09/25/12. She will start Augmentin today and will call me if no better next week.

## 2012-09-01 NOTE — Telephone Encounter (Signed)
Pt reports RLQ pain x 1 months; sometimes it's on the Left, but mostly on the R side. She saw Dr Lorin Picket in [place] and mentioned it to her and was given a Script for Augmentin, but she never filled it. She states her bowels are regular with QOD Miralax. Pt wants to know if she has Diverticulitis, but I informed her not for this long I don't think. Please advise. Also, pt is scheduled for 09/04/12, but she has an ICD, so shouldn't she go to the hospital? Thanks.

## 2012-09-01 NOTE — Telephone Encounter (Signed)
When I suggested the CT, pt states she's a little reluctant; she' had 3 this year mostly for head and face. I then advised her to take the antibiotic and see if it helps her s&s.  Dr Rhea Belton, does pt need to r/s the COLON or proceed on 09/04/12 if she starts Augmentin today? Thanks.

## 2012-09-01 NOTE — Telephone Encounter (Signed)
I think if we are starting ABX for diverticulitis, then the colonoscopy needs to be delayed to allow for healing. Reschedule for 1 month (if no better after reasonable attempt at treatment, then it may not be diverticulitis causing the pain, and we could proceed with colonoscopy sooner).

## 2012-09-02 ENCOUNTER — Other Ambulatory Visit: Payer: Self-pay | Admitting: Family Medicine

## 2012-09-02 DIAGNOSIS — Z1231 Encounter for screening mammogram for malignant neoplasm of breast: Secondary | ICD-10-CM

## 2012-09-04 ENCOUNTER — Encounter: Payer: Medicare Other | Admitting: Internal Medicine

## 2012-09-11 ENCOUNTER — Other Ambulatory Visit: Payer: Self-pay | Admitting: *Deleted

## 2012-09-11 ENCOUNTER — Other Ambulatory Visit: Payer: Medicare Other

## 2012-09-11 ENCOUNTER — Telehealth: Payer: Self-pay | Admitting: Internal Medicine

## 2012-09-11 DIAGNOSIS — R197 Diarrhea, unspecified: Secondary | ICD-10-CM

## 2012-09-11 NOTE — Telephone Encounter (Signed)
Informed pt of orders for stool cultures, CDIFF and explained AB can cause diarrhea. She asked if the stools would have a foul odor and I explained yes. Pt stated understanding.

## 2012-09-11 NOTE — Telephone Encounter (Signed)
09/25/2012 looks like the next available procedure; thus she needs to keep this appointment She having any further rectal bleeding? How often she having diarrhea, is at severe?

## 2012-09-11 NOTE — Telephone Encounter (Signed)
Pt states she is having 5-6 stools daily and she has not seen any rectal bleeding. Informed her the 09/25/12 appt may be the earliest for her COLON. Please advise. Thanks.

## 2012-09-11 NOTE — Telephone Encounter (Signed)
Per 09/01/12 note, pt was to complete the Augmentin and call with an update. Today, pt reports she is still having the same pain on RLQ that moves to the middle of her abdomen; at times, the moves moves left, but it is mainly on the right. She has developed diarrhea, but denies any fever or blood in her stool. She remains opposed to a CT scan and we r/s her COLON to 09/25/12. Please advise; your [facility] schedule is full for tomorrow. Thanks.

## 2012-09-11 NOTE — Telephone Encounter (Signed)
c diff PCR, stool culture, --- in light of recent ABX

## 2012-09-15 ENCOUNTER — Telehealth: Payer: Self-pay | Admitting: *Deleted

## 2012-09-15 LAB — STOOL CULTURE

## 2012-09-15 MED ORDER — METRONIDAZOLE 500 MG PO TABS: 500.0000 mg | ORAL_TABLET | Freq: Three times a day (TID) | ORAL | Status: DC

## 2012-09-15 NOTE — Telephone Encounter (Signed)
Result Notes     Notes Recorded by Beverley Fiedler, MD on 09/15/2012 at 2:26 PM C DIFF POSITIVE Metronidazole 500 mg TID x 10 days Need to reschedule colonoscopy given this infection. OV in mid-Feb and I can make sure she has improved. Reschedule colon for late Feb, early March      lmom for pt to call; cancelled her COLON per Dr Rhea Belton.

## 2012-09-15 NOTE — Telephone Encounter (Signed)
Informed pt she was + for CDIFF and she needs a script for it. We cancelled her COLON and Dr Rhea Belton wants to see her mid February and we will r/s her procedure. She states she wants to discuss adding an EGD d/t her reflux so I will wait until she speaks with Dr Rhea Belton. Instructed pt on universal precautions and asked her to use a separate toilet. Pt stated understanding.

## 2012-09-22 ENCOUNTER — Telehealth: Payer: Self-pay | Admitting: Family Medicine

## 2012-09-22 NOTE — Telephone Encounter (Signed)
Agree with advisement

## 2012-09-22 NOTE — Telephone Encounter (Signed)
Patient Information:  Caller Name: Zettie  Phone: [phone]  Patient: [name], [name]  Gender: Female  DOB: [birth year]  Age: 62 Years  PCP: Roxy Manns [facility])  Office Follow Up:  Does the office need to follow up with this patient?: No  Instructions For The Office: N/A  RN Note:  RN discussed health information regarding MRSA infections per [employer] and advised to call if she develops sore, rash.  Symptoms  Reason For Call & Symptoms: Today, 09/22/2012, pt calling stating she has a friend that was admitted to the hopsital  09/18/2012 with MRSA on legs. Pt has concerns about being  exposed. Pt denies that she has any sores, or current wounds . pt has had recent diagnosis of CDiff 09/15/2012  and is taking Flagyl  Reviewed Health History In EMR: Yes  Reviewed Medications In EMR: Yes  Reviewed Allergies In EMR: Yes  Reviewed Surgeries / Procedures: Yes  Date of Onset of Symptoms: 09/22/2012  Guideline(s) Used:  No Protocol Available - Information Only  Disposition Per Guideline:   Home Care  Reason For Disposition Reached:   Information only question and nurse able to answer  Advice Given:  Call Back If:  New symptoms develop  You become worse.

## 2012-09-25 ENCOUNTER — Encounter: Payer: Medicare Other | Admitting: Internal Medicine

## 2012-10-02 ENCOUNTER — Encounter: Payer: Self-pay | Admitting: Internal Medicine

## 2012-10-02 ENCOUNTER — Other Ambulatory Visit: Payer: Self-pay

## 2012-10-02 ENCOUNTER — Ambulatory Visit (INDEPENDENT_AMBULATORY_CARE_PROVIDER_SITE_OTHER): Payer: Medicare Other | Admitting: *Deleted

## 2012-10-02 ENCOUNTER — Ambulatory Visit: Payer: Medicare Other

## 2012-10-02 DIAGNOSIS — I428 Other cardiomyopathies: Secondary | ICD-10-CM

## 2012-10-02 DIAGNOSIS — Z95810 Presence of automatic (implantable) cardiac defibrillator: Secondary | ICD-10-CM

## 2012-10-09 ENCOUNTER — Encounter: Payer: Self-pay | Admitting: Internal Medicine

## 2012-10-10 ENCOUNTER — Encounter: Payer: Self-pay | Admitting: Internal Medicine

## 2012-10-10 ENCOUNTER — Ambulatory Visit (INDEPENDENT_AMBULATORY_CARE_PROVIDER_SITE_OTHER): Payer: Medicare Other | Admitting: Internal Medicine

## 2012-10-10 ENCOUNTER — Other Ambulatory Visit: Payer: Self-pay | Admitting: Gastroenterology

## 2012-10-10 VITALS — BP 102/66 | HR 68 | Ht 66.5 in | Wt 202.0 lb

## 2012-10-10 DIAGNOSIS — Z1211 Encounter for screening for malignant neoplasm of colon: Secondary | ICD-10-CM

## 2012-10-10 DIAGNOSIS — Z8619 Personal history of other infectious and parasitic diseases: Secondary | ICD-10-CM

## 2012-10-10 DIAGNOSIS — K625 Hemorrhage of anus and rectum: Secondary | ICD-10-CM

## 2012-10-10 DIAGNOSIS — K219 Gastro-esophageal reflux disease without esophagitis: Secondary | ICD-10-CM

## 2012-10-10 MED ORDER — FAMOTIDINE 20 MG PO TABS: 20.0000 mg | ORAL_TABLET | Freq: Two times a day (BID) | ORAL | Status: DC

## 2012-10-10 MED ORDER — PEG-KCL-NACL-NASULF-NA ASC-C 100 G PO SOLR: 1.0000 | Freq: Once | ORAL | Status: DC

## 2012-10-10 NOTE — Patient Instructions (Addendum)
You have been scheduled for a colonoscopy with propofol. Please follow written instructions given to you at your visit today.  Please pick up your prep kit at the pharmacy within the next 1-3 days. If you use inhalers (even only as needed) or a CPAP machine, please bring them with you on the day of your procedure.  We have sent the following medications to your pharmacy for you to pick up at your convenience: Pepcid  Discontinue taking Protonix

## 2012-10-10 NOTE — Progress Notes (Signed)
Patient ID: [name], female   DOB: 10-Jul-1951, 62 y.o.   MRN: [id number]  SUBJECTIVE: HPI [name] is a 62 yo female with PMH of ventricular tachycardia status post ICD placement, diverticulosis of the colon with history of diverticular hemorrhage, remote history of PUD, GERD, and recent C. difficile colitis who seen in followup.  She was initially seen on 08/08/2012 for evaluation of rectal bleeding. This was low on rectal bleeding, but colonoscopy was recommended. While she was awaiting colonoscopy she developed diarrhea and lower abdominal pain. This was after a course of antibiotics. Stool studies were performed and revealed C. difficile infection by PCR.  She was treated and completed a course of metronidazole and had resolution of her diarrhea and no further abdominal pain. She has had some very mild right sided abdominal discomfort which is intermittent. She does continue to have intermittent heartburn but is only taking pantoprazole on an as-needed basis. She has noted some hoarseness and was told by her ENT that she has a reflux-related changes to her vocal cords. No nausea or vomiting. Good appetite. No recent rectal bleeding and no melena.  Previously schedule colonoscopy wasn't performed to 2 active C. difficile colitis infection  Review of Systems  As per history of present illness, otherwise negative   Past Medical History  Diagnosis Date  . Automatic implantable cardiac defibrillator in situ   . Paroxysmal ventricular tachycardia   . Ostium secundum type atrial septal defect   . Unspecified transient cerebral ischemia   . Takotsubo syndrome   . Otalgia, unspecified   . Other malaise and fatigue   . Unspecified vitamin D deficiency   . Headache   . ICD (implantable cardiac defibrillator), single, BSX   . Other pulmonary embolism and infarction   . Mitral valve disorders   . Other chronic nonalcoholic liver disease   . Myalgia and myositis, unspecified    . Cerebral aneurysm, nonruptured   . Hypothyroidism   . Unspecified essential hypertension   . Other and unspecified hyperlipidemia   . Esophageal reflux   . Diverticulosis of colon (without mention of hemorrhage)   . Depressive disorder, not elsewhere classified   . Allergic rhinitis, cause unspecified   . Enlargement of lymph nodes   . Obstructive sleep apnea (adult) (pediatric)   . GI bleed   . PUD (peptic ulcer disease)   . Clostridium difficile infection     Current Outpatient Prescriptions  Medication Sig Dispense Refill  . acetaminophen (TYLENOL) 500 MG tablet Take 500 mg by mouth every 6 (six) hours as needed. For pain      . aspirin 81 MG tablet Take 81 mg by mouth daily.        [name] azelastine (ASTELIN) 137 MCG/SPRAY nasal spray Place 2 sprays into the nose 2 (two) times daily. Use in each nostril as directed  90 mL  3  . bifidobacterium infantis (ALIGN) capsule Take 1 capsule by mouth daily.  14 capsule  0  . carvedilol (COREG) 6.25 MG tablet Take 1 tablet (6.25 mg total) by mouth 2 (two) times daily.  180 tablet  2  . chlorpheniramine-HYDROcodone (TUSSIONEX PENNKINETIC ER) 10-8 MG/5ML LQCR Take 5 mLs by mouth every 12 (twelve) hours as needed (cough).  140 mL  0  . diclofenac sodium (VOLTAREN) 1 % GEL Apply 1 application topically daily as needed. For pain      . hydrochlorothiazide (HYDRODIURIL) 25 MG tablet Take 0.5 tablets (12.5 mg total) by mouth daily.  45  tablet  3  . hydrocodone-acetaminophen (LORCET-HD) 5-500 MG per capsule Take 1 capsule by mouth 3 (three) times daily as needed. For pain      . latanoprost (XALATAN) 0.005 % ophthalmic solution Place 1 drop into both eyes at bedtime.      [name] levothyroxine (SYNTHROID, LEVOTHROID) 50 MCG tablet Take 1 tablet (50 mcg total) by mouth daily.  90 tablet  3  . polyethylene glycol (MIRALAX / GLYCOLAX) packet Take 17 g by mouth daily as needed. For constipation      . potassium chloride (K-DUR) 10 MEQ tablet 1/2 tab po qd       . ramipril (ALTACE) 5 MG capsule Take 1 capsule (5 mg total) by mouth daily.  90 capsule  3  . simvastatin (ZOCOR) 40 MG tablet Take 20 mg by mouth at bedtime.      . traMADol (ULTRAM) 50 MG tablet Take 50 mg by mouth every 8 (eight) hours as needed. For pain      . zolpidem (AMBIEN) 10 MG tablet Take 1 tablet (10 mg total) by mouth at bedtime as needed. For sleep  90 tablet  0  . famotidine (PEPCID) 20 MG tablet Take 1 tablet (20 mg total) by mouth 2 (two) times daily.  60 tablet  6  . peg 3350 powder (MOVIPREP) 100 G SOLR Take 1 kit (100 g total) by mouth once.  1 kit  0   No current facility-administered medications for this visit.    Allergies  Allergen Reactions  . Amitriptyline Hcl     REACTION: ? reaction  . Atorvastatin     REACTION: severe muscle pain  . Cetirizine Hcl     REACTION: sleepy  . Codeine     REACTION: itching  . Paroxetine     REACTION: ? reaction  . Rosuvastatin     REACTION: severe muscle pain  . Carbamazepine Rash    REACTION: ? reaction  . Dilantin (Phenytoin Sodium Extended) Rash    Family History  Problem Relation Age of Onset  . Diabetes Mother   . Diabetes Brother   . Cardiomyopathy      Family history  . Other Brother     Thyroid problem  . Allergies Sister   . Heart disease Sister   . Heart disease Brother   . Heart disease Father   . Colon cancer Maternal Grandmother   . Lung cancer Maternal Grandfather     History  Substance Use Topics  . Smoking status: Former Smoker -- 1.00 packs/day for 15 years    Types: Cigarettes    Quit date: 08/20/1989  . Smokeless tobacco: Never Used     Comment: Started at 14; less than 1 PPD  . Alcohol Use: Yes     Comment: Rare    OBJECTIVE: BP 102/66  Pulse 68  Ht 5' 6.5" (1.689 m)  Wt 202 lb (91.627 kg)  BMI 32.12 kg/m2 Constitutional: Well-developed and well-nourished. No distress. HEENT: Normocephalic and atraumatic. Oropharynx is clear and moist. No oropharyngeal exudate. Conjunctivae  are normal. No scleral icterus. Neck: Neck supple. Trachea midline. Cardiovascular: Normal rate, regular rhythm and intact distal pulses. No M/R/G Pulmonary/chest: Effort normal and breath sounds normal. No wheezing, rales or rhonchi. Abdominal: Soft, mild right lower quadrant pain without rebound or guarding, nondistended. Bowel sounds active throughout. There are no masses palpable. No hepatosplenomegaly. Extremities: no clubbing, cyanosis, or edema Lymphadenopathy: No cervical adenopathy noted. Neurological: Alert and oriented to person place and time. Skin:  Skin is warm and dry. No rashes noted. Psychiatric: Normal mood and affect. Behavior is normal.   EGD 03/30/2003, Dr. Alonna Minium small gastric ulcer.  Colonoscopy 03/31/2003, Dr. Denice Bors, probably the source of her bleeding. Excellent prep. Exam to the IC valve  Flexible sigmoidoscopy 06/02/2007 - Dr. Junius Creamer polyps, multiple diverticuli, internal hemorrhoids  ASSESSMENT AND PLAN: 63 yo female with PMH of ventricular tachycardia status post ICD placement, diverticulosis of the colon with history of diverticular hemorrhage, remote history of PUD, GERD, and recent C. difficile colitis who seen in followup.  1.  Resolved C. difficile colitis/recent rectal bleeding/colorectal cancer screening -- she completed a course of metronidazole and had resolution of her colitis symptoms. She is feeling well now. We discussed the risk for C. difficile recurrence and therefore we'll discontinue pantoprazole in favor of famotidine 20 mg every 12 hours when necessary. Also have advised that she be cautious when initiating antibiotic therapy, and to be sure there is a hard indication for antibiotics. I've also asked that she use probiotics during any future courses of antibiotics, specifically with Florastor 250 mg BID.   -As discussed before, she is due for colonoscopy from a colorectal cancer screening standpoint. She  also had some minor rectal bleeding as discussed at her last appointment which can be further evaluated with colonoscopy. Colonoscopy will be rescheduled today.  2.  Constipation -- good results with MiraLAX and she will continue 17 g daily. Constipation did return after successful treatment for C. difficile colitis and therefore she will resume MiraLAX  3.  GERD -- long-standing issue for her, and we will discontinue PPI as discussed in #1. She will use as needed and as directed famotidine for breakthrough heartburn. We will perform EGD at the same time as her colonoscopy given her long-standing GERD rule out Barrett's esophagus (patient request, which is reasonable)

## 2012-10-13 LAB — REMOTE ICD DEVICE
CHARGE TIME: 9 s
FVT: 0
HV IMPEDENCE: 56 Ohm
RV LEAD IMPEDENCE ICD: 599 Ohm
TOT-0006: 20131114000000
TZAT-0001FASTVT: 2
TZAT-0001SLOWVT: 2
TZAT-0002SLOWVT: NEGATIVE
TZAT-0018FASTVT: NEGATIVE
TZAT-0018SLOWVT: NEGATIVE
TZON-0003SLOWVT: 352.9 ms
TZST-0001FASTVT: 4
TZST-0001FASTVT: 7
TZST-0001FASTVT: 8
TZST-0001SLOWVT: 5
TZST-0001SLOWVT: 6
TZST-0001SLOWVT: 7
TZST-0002SLOWVT: NEGATIVE
TZST-0002SLOWVT: NEGATIVE
TZST-0002SLOWVT: NEGATIVE
TZST-0003FASTVT: 31 J
TZST-0003FASTVT: 41 J
TZST-0003FASTVT: 41 J
VF: 0

## 2012-10-17 ENCOUNTER — Ambulatory Visit
Admission: RE | Admit: 2012-10-17 | Discharge: 2012-10-17 | Disposition: A | Discharge status: Home / Self Care | Payer: Medicare Other | Source: Ambulatory Visit | Attending: Family Medicine | Admitting: Family Medicine

## 2012-10-17 DIAGNOSIS — Z1231 Encounter for screening mammogram for malignant neoplasm of breast: Secondary | ICD-10-CM

## 2012-10-21 ENCOUNTER — Encounter: Payer: Self-pay | Admitting: *Deleted

## 2012-10-22 ENCOUNTER — Encounter: Payer: Medicare Other | Admitting: Internal Medicine

## 2012-10-27 ENCOUNTER — Encounter: Payer: Medicare Other | Admitting: Internal Medicine

## 2012-10-29 ENCOUNTER — Encounter: Payer: Medicare Other | Admitting: Internal Medicine

## 2012-10-31 ENCOUNTER — Encounter: Payer: Self-pay | Admitting: Pulmonary Disease

## 2012-10-31 ENCOUNTER — Encounter: Payer: Self-pay | Admitting: *Deleted

## 2012-10-31 ENCOUNTER — Ambulatory Visit (INDEPENDENT_AMBULATORY_CARE_PROVIDER_SITE_OTHER): Payer: Medicare Other | Admitting: Pulmonary Disease

## 2012-10-31 VITALS — BP 108/70 | HR 64 | Temp 97.6°F | Ht 66.5 in | Wt 204.6 lb

## 2012-10-31 DIAGNOSIS — R0989 Other specified symptoms and signs involving the circulatory and respiratory systems: Secondary | ICD-10-CM

## 2012-10-31 NOTE — Assessment & Plan Note (Signed)
The patient shortness of breath is much improved from the last visit, and I suspect it was related to her cardiomyopathy.  Is unclear whether she may have sarcoid or not, but it is clearly quiescent if she does indeed have the disease.

## 2012-10-31 NOTE — Patient Instructions (Addendum)
Continue staying active and work on a conditioning program Will see you back in one year, and will check cxr at next visit.  Please call us if your breathing worsens.

## 2012-10-31 NOTE — Progress Notes (Signed)
  Subjective:    Patient ID: [name], female    DOB: Aug 24, 1950, 62 y.o.   MRN: [id number]  HPI Patient comes in today for followup of her possible sarcoidosis.  She has had stable changes on her x-rays in the past, and her most recent CT had actually improved.  At the last visit, she was having increased shortness of breath, however had recently been diagnosed with a cardiomyopathy.  With stable PFTs and CT, the decision was made to continue to follow her from a pulmonary standpoint.  She comes in today where she feels that her breathing is much improved, and back to baseline.  She does not have any new complaints, and no significant cough.   Review of Systems  Constitutional: Negative for fever and unexpected weight change.  HENT: Positive for congestion and rhinorrhea. Negative for ear pain, nosebleeds, sore throat, sneezing, trouble swallowing, dental problem, postnasal drip and sinus pressure.   Eyes: Negative for redness and itching.  Respiratory: Negative for cough, chest tightness, shortness of breath and wheezing.   Cardiovascular: Positive for palpitations. Negative for leg swelling.  Gastrointestinal: Negative for nausea and vomiting.  Genitourinary: Negative for dysuria.  Musculoskeletal: Negative for joint swelling.  Skin: Negative for rash.  Neurological: Positive for headaches.  Hematological: Does not bruise/bleed easily.  Psychiatric/Behavioral: Negative for dysphoric mood. The patient is not nervous/anxious.        Objective:   Physical Exam Well-developed female in no acute distress Nose without purulence or discharge noted Chest totally clear to auscultation, no wheezes Cardiac exam with regular rate and rhythm Lower extremities without significant edema, no cyanosis Alert and oriented, moves all 4 extremities.       Assessment & Plan:

## 2012-11-13 ENCOUNTER — Telehealth: Payer: Self-pay | Admitting: Internal Medicine

## 2012-11-13 ENCOUNTER — Encounter: Payer: Medicare Other | Admitting: Internal Medicine

## 2012-11-13 NOTE — Telephone Encounter (Signed)
New Problem:    Patient called in because she is having some chest pains and would like to consult with someone.  Please call back.

## 2012-11-13 NOTE — Telephone Encounter (Signed)
LMTCB

## 2012-11-14 ENCOUNTER — Ambulatory Visit (INDEPENDENT_AMBULATORY_CARE_PROVIDER_SITE_OTHER): Payer: Medicare Other | Admitting: Nurse Practitioner

## 2012-11-14 VITALS — BP 124/80 | HR 60 | Resp 16 | Wt 201.0 lb

## 2012-11-14 DIAGNOSIS — I4891 Unspecified atrial fibrillation: Secondary | ICD-10-CM

## 2012-11-14 NOTE — Telephone Encounter (Signed)
Called patient because we did not hear back from her yesterday.  Patient states she is not currently having chest pain but yesterday had chest pains that shot through straight to her back.  Patient states the pain was worse when lying down.  Patient denies SOB. Advised patient to come in for nurse visit so that we can obtain an EKG and evaluate her chest pain further since this is new onset.

## 2012-11-14 NOTE — Progress Notes (Signed)
Had patient come in for evaluation of chest pain shooting through to her back that she experienced yesterday.  Patient ambulates from lobby without difficulty.  States not currently having chest pain or SOB but does feel like her heart is out of rhythm.  Patient has ICD; she states it has not fired.  12-lead EKG obtained which shows NSR.  Case reviewed with Dr. Clifton James, DOD who states he does not see any reason for patient to receive further tx but since Dr. Graciela Husbands, her primary cardiologist is in the office to let him advise as well.  Dr. Graciela Husbands reviewed information and advised he does not believe patient's s/s are cardiac in nature and to f/u with PCP if pain persists.  Patient verbalized understanding.  Patient states she has appointment in May with Dr. Graciela Husbands.

## 2012-11-20 ENCOUNTER — Ambulatory Visit (AMBULATORY_SURGERY_CENTER): Payer: Medicare Other | Admitting: Internal Medicine

## 2012-11-20 ENCOUNTER — Encounter: Payer: Self-pay | Admitting: Internal Medicine

## 2012-11-20 ENCOUNTER — Other Ambulatory Visit: Payer: Self-pay | Admitting: Internal Medicine

## 2012-11-20 VITALS — BP 133/78 | HR 61 | Temp 98.5°F | Resp 13 | Ht 66.5 in | Wt 202.0 lb

## 2012-11-20 DIAGNOSIS — D126 Benign neoplasm of colon, unspecified: Secondary | ICD-10-CM

## 2012-11-20 DIAGNOSIS — K2970 Gastritis, unspecified, without bleeding: Secondary | ICD-10-CM

## 2012-11-20 DIAGNOSIS — K219 Gastro-esophageal reflux disease without esophagitis: Secondary | ICD-10-CM

## 2012-11-20 DIAGNOSIS — K2990 Gastroduodenitis, unspecified, without bleeding: Secondary | ICD-10-CM

## 2012-11-20 DIAGNOSIS — Z8619 Personal history of other infectious and parasitic diseases: Secondary | ICD-10-CM

## 2012-11-20 DIAGNOSIS — K635 Polyp of colon: Secondary | ICD-10-CM

## 2012-11-20 DIAGNOSIS — Z1211 Encounter for screening for malignant neoplasm of colon: Secondary | ICD-10-CM

## 2012-11-20 MED ORDER — SODIUM CHLORIDE 0.9 % IV SOLN: 500.0000 mL | INTRAVENOUS | Status: DC

## 2012-11-20 MED ORDER — PANTOPRAZOLE SODIUM 40 MG PO TBEC: 40.0000 mg | DELAYED_RELEASE_TABLET | Freq: Every day | ORAL | Status: DC

## 2012-11-20 NOTE — Op Note (Signed)
[facility] [address] [place], [address]   COLONOSCOPY PROCEDURE REPORT  PATIENT: [name], [name]  MR#: [id number] BIRTHDATE: [birth year] , 62  yrs. old GENDER: Female ENDOSCOPIST: Beverley Fiedler, MD REFERRED LK:GMWNU, Marne A. PROCEDURE DATE:  11/20/2012 PROCEDURE:   Colonoscopy with cold biopsy polypectomy ASA CLASS:   Class III INDICATIONS:average risk screening and Rectal Bleeding. MEDICATIONS: MAC sedation, administered by CRNA and propofol (Diprivan) 300mg  IV  DESCRIPTION OF PROCEDURE:   After the risks benefits and alternatives of the procedure were thoroughly explained, informed consent was obtained.  A digital rectal exam revealed no rectal mass.   The [facility] [id number]  endoscope was introduced through the anus and advanced to the cecum, which was identified by both the appendix and ileocecal valve. No adverse events experienced. The quality of the prep was good, using MoviPrep  The instrument was then slowly withdrawn as the colon was fully examined.   COLON FINDINGS: There was severe diverticulosis noted in the ascending colon, transverse colon, descending colon, and sigmoid colon with associated tortuosity.   A sessile polyp measuring 4 mm in size was found in the ascending colon.  A polypectomy was performed with cold forceps.  The resection was complete and the polyp tissue was completely retrieved.   Small internal hemorrhoids were found.  Retroflexed views revealed internal hemorrhoids. The time to cecum=7 minutes 28 seconds.  Withdrawal time=10 minutes 48 seconds.  The scope was withdrawn and the procedure completed. COMPLICATIONS: There were no complications.  ENDOSCOPIC IMPRESSION: 1.   There was severe diverticulosis noted in the ascending colon, transverse colon, descending colon, and sigmoid colon 2.   Sessile polyp measuring 4 mm in size was found in the ascending colon; polypectomy was performed with cold forceps 3.    Small internal hemorrhoids  RECOMMENDATIONS: 1.  Await pathology results 2.  High fiber diet 3.  If the polyp removed today is proven to be an adenomatous (pre-cancerous) polyp, you will need a repeat colonoscopy in 5 years.  Otherwise you should continue to follow colorectal cancer screening guidelines for "routine risk" patients with colonoscopy in 10 years.  You will receive a letter within 1-2 weeks with the results of your biopsy as well as final recommendations.  Please call my office if you have not received a letter after 3 weeks.   eSigned:  Beverley Fiedler, MD 11/20/2012 2:48 PM cc: The Patient

## 2012-11-20 NOTE — Progress Notes (Signed)
Propofol given over incremental dosages 

## 2012-11-20 NOTE — Op Note (Signed)
[facility] [address] [place], [address]   ENDOSCOPY PROCEDURE REPORT  PATIENT: [name], [name]  MR#: [id number] BIRTHDATE: May 03, 1951 , 62  yrs. old GENDER: Female ENDOSCOPIST: Beverley Fiedler, MD REFERRED BY:  Roxy Manns A. PROCEDURE DATE:  11/20/2012 PROCEDURE:  EGD w/ biopsy ASA CLASS:     Class III INDICATIONS:  history of GERD. MEDICATIONS: MAC sedation, administered by CRNA and propofol (Diprivan) 150mg  IV TOPICAL ANESTHETIC: Cetacaine Spray  DESCRIPTION OF PROCEDURE: After the risks benefits and alternatives of the procedure were thoroughly explained, informed consent was obtained.  The [facility] [id number] endoscope was introduced through the mouth and advanced to the second portion of the duodenum. Without limitations.  The instrument was slowly withdrawn as the mucosa was fully examined.     ESOPHAGUS: The esophagus was tortuous.   A normal Z-line was observed 37 cm from the incisors.   The mucosa of the esophagus appeared normal.   There is a 3 cm hiatal hernia.  STOMACH: Moderate acute gastritis characterized by erythema, scant adherent heme, scattered erosions and early (shallow) ulceration(inflammation) was found in the gastric body and antrum, most prominent on the lesser curvature.  Multiple biopsies were performed using cold forceps.  Sample sent for histology.  DUODENUM: The duodenal mucosa showed no abnormalities in the bulb and second portion of the duodenum. Retroflexed views revealed a hiatal hernia.     The scope was then withdrawn from the patient and the procedure completed.  COMPLICATIONS: There were no complications.  ENDOSCOPIC IMPRESSION: 1.   The esophagus was tortuous in the middle and distal third 2.   Normal Z-line was observed 37 cm from the incisors 3.   The mucosa of the esophagus appeared normal; small hiatal hernia 4.   Acute gastritis (inflammation) was found on the lesser curvature of the gastric  body and in the gastric antrum; multiple biopsies 5.   The duodenal mucosa showed no abnormalities in the bulb and second portion of the duodenum    RECOMMENDATIONS: 1.  Add pantoprazole 40 mg daily, this is best taken 30 minutes to one hour before breakfast.  You can continue to use famotidine as needed for breakthrough heartburn symptoms at bedtime. 2.  Await pathology results 3.  Follow-up of helicobacter pylori status, treat if indicated 4.  avoid NSAIDS  eSigned:  Beverley Fiedler, MD 11/20/2012 2:43 PM   AO:ZHYQM A Tower, MD  PATIENT NAME:  [name], [name] MR#: [id number]

## 2012-11-20 NOTE — Progress Notes (Signed)
Patient did not experience any of the following events: a burn prior to discharge; a fall within the facility; wrong site/side/patient/procedure/implant event; or a hospital transfer or hospital admission upon discharge from the facility. (G8907) Patient did not have preoperative order for IV antibiotic SSI prophylaxis. (G8918)  

## 2012-11-20 NOTE — Progress Notes (Signed)
Called to room to assist during endoscopic procedure.  Patient ID and intended procedure confirmed with present staff. Received instructions for my participation in the procedure from the performing physician.  

## 2012-11-20 NOTE — Patient Instructions (Addendum)

## 2012-11-21 ENCOUNTER — Telehealth: Payer: Self-pay | Admitting: *Deleted

## 2012-11-21 NOTE — Telephone Encounter (Signed)
  Follow up Call-  Call back number 11/20/2012  Post procedure Call Back phone  # [phone]  Permission to leave phone message Yes     Patient questions:  Do you have a fever, pain , or abdominal swelling? no Pain Score  2 *  Have you tolerated food without any problems? yes  Have you been able to return to your normal activities? yes  Do you have any questions about your discharge instructions: Diet   no Medications  no Follow up visit  no  Do you have questions or concerns about your Care? no  Actions: * If pain score is 4 or above: No action needed, pain <4. Pt. Describes "discomfort" rather than pain left side.  Encouraged to try to pass gas, by increasing activity, drinking warm liquids, and Lying on left side.  Will call back if discomfort does not subside over the day.

## 2012-11-26 ENCOUNTER — Encounter: Payer: Self-pay | Admitting: Internal Medicine

## 2012-12-03 ENCOUNTER — Encounter: Payer: Self-pay | Admitting: Neurology

## 2012-12-03 ENCOUNTER — Ambulatory Visit (INDEPENDENT_AMBULATORY_CARE_PROVIDER_SITE_OTHER): Payer: Medicare Other | Admitting: Neurology

## 2012-12-03 VITALS — BP 105/63 | HR 64 | Temp 97.9°F | Ht 67.0 in | Wt 200.0 lb

## 2012-12-03 DIAGNOSIS — R209 Unspecified disturbances of skin sensation: Secondary | ICD-10-CM

## 2012-12-03 DIAGNOSIS — R51 Headache: Secondary | ICD-10-CM

## 2012-12-03 DIAGNOSIS — R202 Paresthesia of skin: Secondary | ICD-10-CM

## 2012-12-03 NOTE — Patient Instructions (Signed)
I will have you follow up with one of our nerve and muscle specialists: either Dr. Terrace Arabia, Dr. Marjory Lies or Dr. Anne Hahn. We will have to call you for that appt. I would suggest a 6 mo FU, sooner, if you need to, especially if your R arm symptoms get worse. Your exam looks well today, which is reassuring.

## 2012-12-03 NOTE — Progress Notes (Signed)
Subjective:    Patient ID: [name] is a 62 y.o. female.  HPI Interim history:  [name] is a 62 year old right-handed woman who presents for followup consultation of her headaches with an underlying history of sarcoidosis, right ophthalmic artery aneurysm, status post clipping, anticardiolipin antibody syndrome, history of V. tach requiring a pacemaker. This is her first visit with me and she previously followed with Dr. Avie Echevaria was last seen by him 6 months ago on 05/20/2012, at which time he requested a CT brain with and without contrast in view of her history of systemic sarcoidosis to rule out CNS involvement. Her CT head with and without contrast from 05/22/2012 was reported as unremarkable. She has an underlying complex medical history of cardiac arrhythmias, status post pacemaker, anticardiolipin antibody syndrome, left brain TIA in 2005, right ophthalmic artery cerebral aneurysm, status post clipping, OSA, not using CPAP, hypertension, pulmonary embolism, fibromyalgia, gastric ulcers, history of GI bleed, MI, and glaucoma. She is currently on Coreg CR, baby aspirin, pantoprazole, generic Ambien, potassium, hydrochlorothiazide, ramipril, Zocor and generic Synthroid. In the past 2-3 months she has had tingling in the R forearm and the dorsum of the R hand. No numbness is reported and no burning, no radiating Sx from the neck area. She has had minor sinus type HA and has no other complaints. She lives alone, she has 2 grown children. She recently had blood work with her rheumatologist and this included B12 and she was told it was fine.   I reviewed Dr. Imagene Gurney prior notes and the patient's records of those a summary of that review:  62 year old right-handed lady with a complicated medical history who was diagnosed with sarcoidosis. She has a history of patent foramen ovale and had a TEE, she has had 60% stenosis of the carotid artery, status post aneurysm clipping, glaucoma,  sustained V. tach requiring a pacemaker, obstructive sleep apnea. She has been experiencing headaches and dizziness and had normal ACE level, TSH and CBC in August 2013. She follows with Dr. Graciela Husbands in cardiology. She is independent in her ADLs and has seen Dr. Pollyann Kennedy in ENT.   Her Past Medical History Is Significant For: Past Medical History  Diagnosis Date  . Automatic implantable cardiac defibrillator in situ   . Paroxysmal ventricular tachycardia   . Ostium secundum type atrial septal defect   . Unspecified transient cerebral ischemia   . Takotsubo syndrome   . Otalgia, unspecified   . Other malaise and fatigue   . Unspecified vitamin D deficiency   . Headache   . ICD (implantable cardiac defibrillator), single, BSX   . Other pulmonary embolism and infarction   . Mitral valve disorders   . Other chronic nonalcoholic liver disease   . Myalgia and myositis, unspecified   . Cerebral aneurysm, nonruptured   . Hypothyroidism   . Unspecified essential hypertension   . Other and unspecified hyperlipidemia   . Esophageal reflux   . Diverticulosis of colon (without mention of hemorrhage)   . Depressive disorder, not elsewhere classified   . Allergic rhinitis, cause unspecified   . Enlargement of lymph nodes   . Obstructive sleep apnea (adult) (pediatric)   . GI bleed   . PUD (peptic ulcer disease)   . Clostridium difficile infection     Her Past Surgical History Is Significant For: Past Surgical History  Procedure Laterality Date  . Partial hysterectomy      Fibroids  . Cerebral aneurysm repair  02/1999  . Knee  arthroscopy      bilateral  . Cardiac defibrillator placement      Guidant  . Adenosine cardiolite  2003    neg, EF 50%  . Carotid dopplers  2006    neg  . Hospitalization  2004    GI bleed, PUD, diverticulosis (EGD,colonscopy)  . Hospitalization      PE, NSVT, s/p defib  . Abi  2006    normal    Her Family History Is Significant For: Family History  Problem  Relation Age of Onset  . Diabetes Mother   . Diabetes Brother   . Cardiomyopathy      Family history  . Other Brother     Thyroid problem  . Allergies Sister   . Heart disease Sister   . Heart disease Brother   . Heart disease Father   . Colon cancer Maternal Grandmother   . Lung cancer Maternal Grandfather     Her Social History Is Significant For: History   Social History  . Marital Status: Divorced    Spouse Name: N/A    Number of Children: 2  . Years of Education: N/A   Occupational History  . DISABLED   .     Social History Main Topics  . Smoking status: Former Smoker -- 1.00 packs/day for 15 years    Types: Cigarettes    Quit date: 08/20/1989  . Smokeless tobacco: Never Used     Comment: Started at 14; less than 1 PPD  . Alcohol Use: Yes     Comment: Rare  . Drug Use: No  . Sexually Active: Not on file   Other Topics Concern  . Not on file   Social History Narrative   Retired from [employer]      2 children      Lives alone      Divorced    Her Allergies Are:  Allergies  Allergen Reactions  . Amitriptyline Hcl     REACTION: ? reaction  . Atorvastatin     REACTION: severe muscle pain  . Cetirizine Hcl     REACTION: sleepy  . Codeine     REACTION: itching  . Paroxetine     REACTION: ? reaction  . Rosuvastatin     REACTION: severe muscle pain  . Carbamazepine Rash    REACTION: ? reaction  . Dilantin (Phenytoin Sodium Extended) Rash  :   Her Current Medications Are:  Outpatient Encounter Prescriptions as of 12/03/2012  Medication Sig Dispense Refill  . acetaminophen (TYLENOL) 500 MG tablet Take 500 mg by mouth every 6 (six) hours as needed. For pain      . aspirin 81 MG tablet Take 81 mg by mouth daily.        [name] azelastine (ASTELIN) 137 MCG/SPRAY nasal spray Place 2 sprays into the nose 2 (two) times daily. Use in each nostril as directed  90 mL  3  . bifidobacterium infantis (ALIGN) capsule Take 1 capsule by mouth daily.  14 capsule  0   . carvedilol (COREG) 6.25 MG tablet Take 1 tablet (6.25 mg total) by mouth 2 (two) times daily.  180 tablet  2  . diclofenac sodium (VOLTAREN) 1 % GEL Apply 1 application topically daily as needed. For pain      . famotidine (PEPCID) 20 MG tablet Take 1 tablet (20 mg total) by mouth 2 (two) times daily.  60 tablet  6  . hydrochlorothiazide (HYDRODIURIL) 25 MG tablet Take 0.5 tablets (12.5 mg  total) by mouth daily.  45 tablet  3  . latanoprost (XALATAN) 0.005 % ophthalmic solution Place 1 drop into both eyes at bedtime.      [name] levothyroxine (SYNTHROID, LEVOTHROID) 50 MCG tablet Take 1 tablet (50 mcg total) by mouth daily.  90 tablet  3  . pantoprazole (PROTONIX) 40 MG tablet Take 1 tablet (40 mg total) by mouth daily.  30 tablet  3  . potassium chloride (K-DUR) 10 MEQ tablet 1/2 tab po qd      . ramipril (ALTACE) 5 MG capsule Take 1 capsule (5 mg total) by mouth daily.  90 capsule  3  . simvastatin (ZOCOR) 40 MG tablet Take 20 mg by mouth at bedtime.      . traMADol (ULTRAM) 50 MG tablet Take 50 mg by mouth every 8 (eight) hours as needed. For pain      . zolpidem (AMBIEN) 10 MG tablet Take 1 tablet (10 mg total) by mouth at bedtime as needed. For sleep  90 tablet  0  . chlorpheniramine-HYDROcodone (TUSSIONEX PENNKINETIC ER) 10-8 MG/5ML LQCR Take 5 mLs by mouth every 12 (twelve) hours as needed (cough).  140 mL  0   No facility-administered encounter medications on file as of 12/03/2012.  :  Review of Systems  Constitutional: Positive for fatigue.  Eyes: Positive for visual disturbance (double vision, loss of vision).  Endocrine: Positive for cold intolerance.  Musculoskeletal: Positive for arthralgias.  Skin: Positive for rash.       itching  Allergic/Immunologic: Positive for environmental allergies.  Neurological: Positive for numbness (right arm).    Objective:  Neurologic Exam  Physical Exam Physical Examination:   Filed Vitals:   12/03/12 1139  BP: 105/63  Pulse: 64  Temp:  97.9 F (36.6 C)    General Examination: The patient is a very pleasant 62 y.o. female in no acute distress.  HEENT: Normocephalic, atraumatic, pupils are equal, round and reactive to light and accommodation. Funduscopic exam is normal with sharp disc margins noted. Extraocular tracking is good without nystagmus noted. Normal smooth pursuit is noted. Hearing is grossly intact. Tympanic membranes are clear bilaterally. Face is symmetric with normal facial animation and normal facial sensation. Speech is clear with no dysarthria noted. There is no hypophonia. There is no lip, neck or jaw tremor. Neck is supple with full range of motion. There are no carotid bruits on auscultation. Oropharynx exam reveals normal findings. No significant airway crowding is noted. Mallampati is class II. Tongue is larger and protrudes centrally and palate elevates symmetrically.    Chest: is clear to auscultation without wheezing, rhonchi or crackles noted.  Heart: sounds are regular and normal without murmurs, rubs or gallops noted.   Abdomen: is soft, non-tender and non-distended with normal bowel sounds appreciated on auscultation.  Extremities: There is no pitting edema in the distal lower extremities bilaterally. Pedal pulses are intact.  Skin: is warm and dry with no trophic changes noted.  Musculoskeletal: exam reveals no obvious joint deformities, tenderness or joint swelling or erythema.  Neurologically:  Mental status: The patient is awake, alert and oriented in all 4 spheres. Her memory, attention, language and knowledge are appropriate. There is no aphasia, agnosia, apraxia or anomia. Speech is clear with normal prosody and enunciation. Thought process is linear. Mood is congruent and affect is normal.  Cranial nerves are as described above under HEENT exam. In addition, shoulder shrug is normal with equal shoulder height noted. Motor exam: Normal bulk, strength and tone  is noted. There is no drift,  tremor or rebound. I do not notice any swelling or redness or trophic changes in her right forearm or in her right hand. I do not palpate any swollen veins. Romberg is negative. Reflexes are 2+ throughout. Toes are downgoing bilaterally. Fine motor skills are intact with normal finger taps, normal hand movements, normal rapid alternating patting, normal foot taps and normal foot agility.  Cerebellar testing shows no dysmetria or intention tremor on finger to nose testing. Heel to shin is unremarkable bilaterally. There is no truncal or gait ataxia.  Sensory exam is intact to light touch, pinprick, vibration, temperature sense and proprioception in the upper and lower extremities.  Gait, station and balance are unremarkable. No veering to one side is noted. No leaning to one side is noted. Posture is age-appropriate and stance is narrow based. No problems turning are noted. She turns en bloc.         Assessment and Plan:   Assessment and Plan:  In summary, [name] is a very pleasant 62 y.o.-year old female with a history of sarcoidosis, status post pacemaker placement for cardiac arrhythmias, and T. cardiolipin antibody syndrome, history of clipping of an unruptured right ophthalmic artery cerebral aneurysm, who had been following with Dr. love for headaches. These headaches are currently under control. She has minor sinus type headaches she says. Her exam is actually nonfocal today and I reassured her in that regard. She is complaining of intermittent paresthesias in her right forearm and right dorsum of her hand but I do not see any trophic changes, no swelling, and certainly no decrease in sensation or strength is noted. I suggested that we watch and wait. She had some blood work recently which per her report was fine. I suggested followup with one of our nerve and muscle specialist in 6 months. She is advised to call for sooner appointment if need be. She was in agreement. In the interim,  if she has any questions she is encouraged to call.

## 2012-12-10 ENCOUNTER — Telehealth: Payer: Self-pay | Admitting: *Deleted

## 2013-01-01 ENCOUNTER — Encounter: Payer: Medicare Other | Admitting: *Deleted

## 2013-01-14 ENCOUNTER — Encounter: Payer: Self-pay | Admitting: Internal Medicine

## 2013-01-14 ENCOUNTER — Ambulatory Visit (INDEPENDENT_AMBULATORY_CARE_PROVIDER_SITE_OTHER): Payer: Medicare Other | Admitting: Internal Medicine

## 2013-01-14 VITALS — BP 126/76 | HR 72 | Ht 67.0 in | Wt 203.0 lb

## 2013-01-14 DIAGNOSIS — I472 Ventricular tachycardia: Secondary | ICD-10-CM

## 2013-01-14 DIAGNOSIS — G4733 Obstructive sleep apnea (adult) (pediatric): Secondary | ICD-10-CM

## 2013-01-14 DIAGNOSIS — I10 Essential (primary) hypertension: Secondary | ICD-10-CM

## 2013-01-14 DIAGNOSIS — G478 Other sleep disorders: Secondary | ICD-10-CM

## 2013-01-14 DIAGNOSIS — G4730 Sleep apnea, unspecified: Secondary | ICD-10-CM | POA: Insufficient documentation

## 2013-01-14 DIAGNOSIS — I428 Other cardiomyopathies: Secondary | ICD-10-CM

## 2013-01-14 DIAGNOSIS — Z95810 Presence of automatic (implantable) cardiac defibrillator: Secondary | ICD-10-CM

## 2013-01-14 LAB — ICD DEVICE OBSERVATION
BRDY-0002RV: 40 {beats}/min
DEV-0020ICD: NEGATIVE
TZAT-0001FASTVT: 1
TZAT-0002SLOWVT: NEGATIVE
TZAT-0013FASTVT: 1
TZAT-0018FASTVT: NEGATIVE
TZON-0003FASTVT: 285.7 ms
TZON-0003SLOWVT: 352.9 ms
TZST-0001FASTVT: 3
TZST-0001FASTVT: 6
TZST-0001SLOWVT: 3
TZST-0001SLOWVT: 6
TZST-0001SLOWVT: 7
TZST-0002SLOWVT: NEGATIVE
TZST-0002SLOWVT: NEGATIVE
TZST-0003FASTVT: 41 J
TZST-0003FASTVT: 41 J
TZST-0003FASTVT: 41 J

## 2013-01-14 MED ORDER — SIMVASTATIN 40 MG PO TABS: 20.0000 mg | ORAL_TABLET | Freq: Every day | ORAL | Status: DC

## 2013-01-14 NOTE — Assessment & Plan Note (Signed)
The patient's device was interrogated.  The information was reviewed. No changes were made in the programming.    

## 2013-01-14 NOTE — Assessment & Plan Note (Signed)
She is willing to consider treatment for sleep apnea. We will change her appointment with Dr. Shelle Iron so that 2 of them to review treatment options.

## 2013-01-14 NOTE — Patient Instructions (Addendum)
Your physician wants you to follow-up in: 1 YEAR with Dr Klein.  You will receive a reminder letter in the mail two months in advance. If you don't receive a letter, please call our office to schedule the follow-up appointment.  Your physician recommends that you continue on your current medications as directed. Please refer to the Current Medication list given to you today.  

## 2013-01-14 NOTE — Assessment & Plan Note (Signed)
Nonsustained events noted on her device

## 2013-01-14 NOTE — Assessment & Plan Note (Signed)
Ejection fraction 45%. Continue ACE inhibitors and beta blockers.

## 2013-01-14 NOTE — Assessment & Plan Note (Signed)
Well controlled 

## 2013-01-14 NOTE — Progress Notes (Signed)
Patient Care Team: Judy Pimple, MD as PCP - General   HPI  [name] Shanyia Stines is a 62 y.o. female Seen in followup for ICD implanted for secondary prevention with hx  ventricular tachycardia in the context of sarcoid and nonobstructive coronary disease and  Cardiomyopathy  She has some exercise fatigue with mild dyspnea; she has significant snoring and has a history of an abnormal sleep study   2013 echocardiogram demonstrated moderate left ventricular dysfunction with EF of 35-40% new from September 2012;    LHC 7/13  no significant coronary disease and an LVEF of 45%       Past Medical History  Diagnosis Date  . Automatic implantable cardiac defibrillator in situ   . Paroxysmal ventricular tachycardia   . Ostium secundum type atrial septal defect   . Unspecified transient cerebral ischemia   . Takotsubo syndrome   . Otalgia, unspecified   . Other malaise and fatigue   . Unspecified vitamin D deficiency   . Headache(784.0)   . ICD (implantable cardiac defibrillator), single, BSX   . Other pulmonary embolism and infarction   . Mitral valve disorders   . Other chronic nonalcoholic liver disease   . Myalgia and myositis, unspecified   . Cerebral aneurysm, nonruptured   . Hypothyroidism   . Unspecified essential hypertension   . Other and unspecified hyperlipidemia   . Esophageal reflux   . Diverticulosis of colon (without mention of hemorrhage)   . Depressive disorder, not elsewhere classified   . Allergic rhinitis, cause unspecified   . Enlargement of lymph nodes   . Obstructive sleep apnea (adult) (pediatric)   . GI bleed   . PUD (peptic ulcer disease)   . Clostridium difficile infection     Past Surgical History  Procedure Laterality Date  . Partial hysterectomy      Fibroids  . Cerebral aneurysm repair  02/1999  . Knee arthroscopy      bilateral  . Cardiac defibrillator placement      Guidant  . Adenosine cardiolite  2003    neg, EF 50%  . Carotid  dopplers  2006    neg  . Hospitalization  2004    GI bleed, PUD, diverticulosis (EGD,colonscopy)  . Hospitalization      PE, NSVT, s/p defib  . Abi  2006    normal    Current Outpatient Prescriptions  Medication Sig Dispense Refill  . acetaminophen (TYLENOL) 500 MG tablet Take 500 mg by mouth every 6 (six) hours as needed. For pain      . aspirin 81 MG tablet Take 81 mg by mouth daily.        [name] azelastine (ASTELIN) 137 MCG/SPRAY nasal spray Place 2 sprays into the nose 2 (two) times daily. Use in each nostril as directed  90 mL  3  . bifidobacterium infantis (ALIGN) capsule Take 1 capsule by mouth daily.  14 capsule  0  . carvedilol (COREG) 6.25 MG tablet Take 1 tablet (6.25 mg total) by mouth 2 (two) times daily.  180 tablet  2  . chlorpheniramine-HYDROcodone (TUSSIONEX PENNKINETIC ER) 10-8 MG/5ML LQCR Take 5 mLs by mouth every 12 (twelve) hours as needed (cough).  140 mL  0  . diclofenac sodium (VOLTAREN) 1 % GEL Apply 1 application topically daily as needed. For pain      . famotidine (PEPCID) 20 MG tablet Take 1 tablet (20 mg total) by mouth 2 (two) times daily.  60 tablet  6  .  hydrochlorothiazide (HYDRODIURIL) 25 MG tablet Take 0.5 tablets (12.5 mg total) by mouth daily.  45 tablet  3  . latanoprost (XALATAN) 0.005 % ophthalmic solution Place 1 drop into both eyes at bedtime.      [name] levothyroxine (SYNTHROID, LEVOTHROID) 50 MCG tablet Take 1 tablet (50 mcg total) by mouth daily.  90 tablet  3  . pantoprazole (PROTONIX) 40 MG tablet Take 1 tablet (40 mg total) by mouth daily.  30 tablet  3  . potassium chloride (K-DUR) 10 MEQ tablet 1/2 tab po qd      . ramipril (ALTACE) 5 MG capsule Take 1 capsule (5 mg total) by mouth daily.  90 capsule  3  . simvastatin (ZOCOR) 40 MG tablet Take 20 mg by mouth at bedtime.      . traMADol (ULTRAM) 50 MG tablet Take 50 mg by mouth every 8 (eight) hours as needed. For pain      . zolpidem (AMBIEN) 10 MG tablet Take 1 tablet (10 mg total) by mouth at  bedtime as needed. For sleep  90 tablet  0   No current facility-administered medications for this visit.    Allergies  Allergen Reactions  . Amitriptyline Hcl     REACTION: ? reaction  . Atorvastatin     REACTION: severe muscle pain  . Cetirizine Hcl     REACTION: sleepy  . Codeine     REACTION: itching  . Paroxetine     REACTION: ? reaction  . Rosuvastatin     REACTION: severe muscle pain  . Carbamazepine Rash    REACTION: ? reaction  . Dilantin (Phenytoin Sodium Extended) Rash    Review of Systems negative except from HPI and PMH  Physical Exam BP 126/76  Pulse 72  Ht 5\' 7"  (1.702 m)  Wt 203 lb (92.08 kg)  BMI 31.79 kg/m2 Well developed and well nourished in no acute distress HENT normal E scleral and icterus clear Neck Supple JVP flat; carotids brisk and full Clear to ausculation  Regular rate and rhythm, no murmurs gallops or rub Soft with active bowel sounds No clubbing cyanosis none Edema Alert and oriented, grossly normal motor and sensory function Skin Warm and Dry    Assessment and  Plan

## 2013-01-15 ENCOUNTER — Encounter: Payer: Self-pay | Admitting: Pulmonary Disease

## 2013-01-15 ENCOUNTER — Encounter: Payer: Self-pay | Admitting: Neurology

## 2013-01-15 ENCOUNTER — Ambulatory Visit (INDEPENDENT_AMBULATORY_CARE_PROVIDER_SITE_OTHER): Payer: Medicare Other | Admitting: Pulmonary Disease

## 2013-01-15 VITALS — BP 110/70 | HR 72 | Temp 97.4°F | Ht 66.5 in | Wt 206.4 lb

## 2013-01-15 DIAGNOSIS — G2581 Restless legs syndrome: Secondary | ICD-10-CM

## 2013-01-15 DIAGNOSIS — M62838 Other muscle spasm: Secondary | ICD-10-CM | POA: Insufficient documentation

## 2013-01-15 DIAGNOSIS — G4733 Obstructive sleep apnea (adult) (pediatric): Secondary | ICD-10-CM

## 2013-01-15 MED ORDER — ROPINIROLE HCL 0.5 MG PO TABS: ORAL_TABLET | ORAL | Status: DC

## 2013-01-15 NOTE — Addendum Note (Signed)
Addended by: Ozella Almond R on: 01/15/2013 11:03 AM   Modules accepted: Orders

## 2013-01-15 NOTE — Assessment & Plan Note (Signed)
The patient has a history of very mild obstructive sleep apnea in 2010, and has actually lost weight since that time.  She does have frequent awakenings at night, nonrestorative sleep, and definite sleep pressure during the day.  However, it is unclear if this is related to her very mild sleep apnea, or to her limb movements.  Her history is very suggestive of RLS.  At this point, I would like to treat her for her movement disorder, and see how she responds.  Her degree of sleep apnea is really not a significant cardiovascular risk for her, and I have asked her to work aggressively on further weight loss.

## 2013-01-15 NOTE — Progress Notes (Signed)
Subjective:    Patient ID: [name], female    DOB: 1951-03-29, 62 y.o.   MRN: [id number]  HPI The patient is a 62 year old female who been asked to see for management of obstructive sleep apnea.  The patient was diagnosed with very mild sleep apnea in 2010, with an AHI of 9 events per hour.  She was also noted to have large numbers of periodic limb movements, with an arousal index of 6 per hour.  The patient has been noted to have snoring along with snoring or rales.  But no one has mentioned an abnormal breathing pattern during sleep.  She has also had rare choking arousals.  She has frequent awakenings at night for unknown reasons, and does not feel rested in the mornings upon arising.  She has definite sleepiness during the day with inactivity, and her Epworth score is 16.  She falls asleep easily in the evenings watching television, and can have sleepiness driving longer distances.  She has been noted to kick a lot during the night, and describes classic restless leg symptoms.  She does have a history of intermittent iron deficiency.  The patient states that her weight is down 15 pounds from her sleep study in 2010.   Sleep Questionnaire What time do you typically go to bed?( Between what hours) 11; 30pm to 1am  11; 30pm to 1am  at 0957 on 01/15/13 by Alecia Lemming, CMA How long does it take you to fall asleep? 30 min 30 min at 0957 on 01/15/13 by Alecia Lemming, CMA How many times during the night do you wake up? No Value 5-6  at 0957 on 01/15/13 by Alecia Lemming, CMA What time do you get out of bed to start your day? 0900 0900 at 0957 on 01/15/13 by Alecia Lemming, CMA Do you drive or operate heavy machinery in your occupation? How much has your weight changed (up or down) over the past two years? (In pounds) 0 oz (0 kg) 0 oz (0 kg) at 0957 on 01/15/13 by Alecia Lemming, CMA Have you ever had a sleep study before? Yes Yes at 0957 on 01/15/13 by Alecia Lemming, CMA If yes, [facility] of [facility]? [facility]  at 0957 on 01/15/13 by Alecia Lemming, CMA If yes, date of study? 2011 2011 at 0957 on 01/15/13 by Alecia Lemming, CMA Do you currently use CPAP? No No at 0957 on 01/15/13 by Alecia Lemming, CMA Do you wear oxygen at any time? No No at 0957 on 01/15/13 by Alecia Lemming, CMA   Review of Systems  Constitutional: Negative for fever and unexpected weight change.  HENT: Positive for congestion, sore throat, rhinorrhea and postnasal drip. Negative for ear pain, nosebleeds, sneezing, trouble swallowing, dental problem and sinus pressure.   Eyes: Positive for redness and itching.  Respiratory: Positive for cough and shortness of breath. Negative for chest tightness and wheezing.   Cardiovascular: Negative for palpitations and leg swelling.  Gastrointestinal: Negative for nausea and vomiting.  Genitourinary: Negative for dysuria.  Musculoskeletal: Negative for joint swelling.  Skin: Negative for rash.  Neurological: Negative for headaches.  Hematological: Bruises/bleeds easily.  Psychiatric/Behavioral: Negative for dysphoric mood. The patient is not nervous/anxious.        Objective:   Physical Exam Constitutional:  Overweight female, no acute distress  HENT:  Nares patent without discharge, septal deviation to the left with narrowing.   Oropharynx without exudate,  palate and uvula are elongated.  Eyes:  Perrla, eomi, no scleral icterus  Neck:  No JVD, no TMG  Cardiovascular:  Normal rate, regular rhythm, no rubs or gallops.  No murmurs        Intact distal pulses  Pulmonary :  Normal breath sounds, no stridor or respiratory distress   No rales, rhonchi, or wheezing  Abdominal:  Soft, nondistended, bowel sounds present.  No tenderness noted.   Musculoskeletal:  No significant lower extremity edema noted.  Lymph Nodes:  No cervical lymphadenopathy noted  Skin:  No cyanosis  noted  Neurologic:  Alert, appropriate, moves all 4 extremities without obvious deficit.         Assessment & Plan:

## 2013-01-15 NOTE — Assessment & Plan Note (Signed)
The patient's history is consistent with restless leg syndrome, and she does have a history of intermittent iron deficiency.  We'll give her a trial of a dopamine agonist, and if she sees significant improvement, I will send a note to her primary physician to make sure that her iron deficiency is adequately treated.

## 2013-01-15 NOTE — Patient Instructions (Addendum)
Will try requip 0.25mg  one after dinner each night.  If helps but no completely, can increase to 2 after dinner. Please call me in 3 weeks with how things are going. Work on further weight loss.

## 2013-01-16 ENCOUNTER — Encounter: Payer: Self-pay | Admitting: Neurology

## 2013-01-16 ENCOUNTER — Ambulatory Visit (INDEPENDENT_AMBULATORY_CARE_PROVIDER_SITE_OTHER): Payer: Medicare Other | Admitting: Neurology

## 2013-01-16 VITALS — BP 120/73 | HR 80 | Wt 204.0 lb

## 2013-01-16 DIAGNOSIS — R42 Dizziness and giddiness: Secondary | ICD-10-CM

## 2013-01-16 DIAGNOSIS — R209 Unspecified disturbances of skin sensation: Secondary | ICD-10-CM

## 2013-01-16 NOTE — Progress Notes (Signed)
Reason for visit: Dizziness  [name] is an 62 y.o. female  History of present illness:  [name] is a 62 year old right-handed black female with a history of multiple medical problems. This patient has had a right ophthalmic artery aneurysm, status post clipping. The patient had a TIA affecting the left brain, and she has had an anti-cardiolipin antibody syndrome. The patient has a history of coronary artery disease, and obstructive sleep apnea. The patient has had periodic limb movements demonstrated during sleep. The patient is complaining of dizziness when she was last seen in the fall of 2013. A CT scan of brain was done with and without contrast and was unremarkable. The patient returns to the office today indicating that the dizziness problem has improved. The patient has noted a small area of numbness without pain or tingling involving the anterior elbow area on the right. The patient denies any weakness of the arm, and she has had no progression of the numbness since its onset. The patient has some mild gait instability, no falls. The patient does have occasional headaches, but she indicates that this is not a significant factor for her. The patient returns for an evaluation.  Past Medical History  Diagnosis Date  . Automatic implantable cardiac defibrillator in situ   . Paroxysmal ventricular tachycardia   . Ostium secundum type atrial septal defect   . Unspecified transient cerebral ischemia   . Takotsubo syndrome   . Otalgia, unspecified   . Other malaise and fatigue   . Unspecified vitamin D deficiency   . Headache(784.0)   . ICD (implantable cardiac defibrillator), single, BSX   . Other pulmonary embolism and infarction   . Mitral valve disorders   . Other chronic nonalcoholic liver disease   . Myalgia and myositis, unspecified   . Cerebral aneurysm, nonruptured   . Hypothyroidism   . Unspecified essential hypertension   . Other and unspecified  hyperlipidemia   . Esophageal reflux   . Diverticulosis of colon (without mention of hemorrhage)   . Depressive disorder, not elsewhere classified   . Allergic rhinitis, cause unspecified   . Enlargement of lymph nodes   . Obstructive sleep apnea (adult) (pediatric)   . GI bleed   . PUD (peptic ulcer disease)   . Clostridium difficile infection     Past Surgical History  Procedure Laterality Date  . Partial hysterectomy      Fibroids  . Cerebral aneurysm repair  02/1999  . Knee arthroscopy      bilateral  . Cardiac defibrillator placement      Guidant  . Adenosine cardiolite  2003    neg, EF 50%  . Carotid dopplers  2006    neg  . Hospitalization  2004    GI bleed, PUD, diverticulosis (EGD,colonscopy)  . Hospitalization      PE, NSVT, s/p defib  . Abi  2006    normal    Family History  Problem Relation Age of Onset  . Diabetes Mother   . Alzheimer's disease Mother   . Diabetes Brother   . Cardiomyopathy      Family history  . Other Brother     Thyroid problem  . Allergies Sister   . Heart disease Sister   . Heart disease Brother   . Heart disease Father   . Colon cancer Maternal Grandmother   . Lung cancer Maternal Grandfather     Social history:  reports that she quit smoking about 23 years ago.  Her smoking use included Cigarettes. She has a 15 pack-year smoking history. She has never used smokeless tobacco. She reports that  drinks alcohol. She reports that she does not use illicit drugs.  Allergies:  Allergies  Allergen Reactions  . Amitriptyline Hcl     REACTION: ? reaction  . Atorvastatin     REACTION: severe muscle pain  . Cetirizine Hcl     REACTION: sleepy  . Codeine     REACTION: itching  . Paroxetine     REACTION: ? reaction  . Rosuvastatin     REACTION: severe muscle pain  . Carbamazepine Rash    REACTION: ? reaction  . Dilantin (Phenytoin Sodium Extended) Rash    Medications:  Current Outpatient Prescriptions on File Prior to Visit   Medication Sig Dispense Refill  . acetaminophen (TYLENOL) 500 MG tablet Take 500 mg by mouth every 6 (six) hours as needed. For pain      . aspirin 81 MG tablet Take 81 mg by mouth daily.        [name] azelastine (ASTELIN) 137 MCG/SPRAY nasal spray Place 2 sprays into the nose 2 (two) times daily. Use in each nostril as directed  90 mL  3  . bifidobacterium infantis (ALIGN) capsule Take 1 capsule by mouth daily.  14 capsule  0  . carvedilol (COREG) 6.25 MG tablet Take 1 tablet (6.25 mg total) by mouth 2 (two) times daily.  180 tablet  2  . chlorpheniramine-HYDROcodone (TUSSIONEX PENNKINETIC ER) 10-8 MG/5ML LQCR Take 5 mLs by mouth every 12 (twelve) hours as needed (cough).  140 mL  0  . diclofenac sodium (VOLTAREN) 1 % GEL Apply 1 application topically daily as needed. For pain      . famotidine (PEPCID) 20 MG tablet Take 1 tablet (20 mg total) by mouth 2 (two) times daily.  60 tablet  6  . hydrochlorothiazide (HYDRODIURIL) 25 MG tablet Take 0.5 tablets (12.5 mg total) by mouth daily.  45 tablet  3  . latanoprost (XALATAN) 0.005 % ophthalmic solution Place 1 drop into both eyes at bedtime.      [name] levothyroxine (SYNTHROID, LEVOTHROID) 50 MCG tablet Take 1 tablet (50 mcg total) by mouth daily.  90 tablet  3  . pantoprazole (PROTONIX) 40 MG tablet Take 1 tablet (40 mg total) by mouth daily.  30 tablet  3  . potassium chloride (K-DUR) 10 MEQ tablet 1/2 tab po qd      . ramipril (ALTACE) 5 MG capsule Take 1 capsule (5 mg total) by mouth daily.  90 capsule  3  . rOPINIRole (REQUIP) 0.5 MG tablet Take 1 tablet after dinner at night may increase up to 2 at night.  60 tablet  3  . simvastatin (ZOCOR) 40 MG tablet Take 0.5 tablets (20 mg total) by mouth at bedtime.  90 tablet  3  . traMADol (ULTRAM) 50 MG tablet Take 50 mg by mouth every 8 (eight) hours as needed. For pain      . zolpidem (AMBIEN) 10 MG tablet Take 1 tablet (10 mg total) by mouth at bedtime as needed. For sleep  90 tablet  0   No current  facility-administered medications on file prior to visit.    ROS:  Out of a complete 14 system review of symptoms, the patient complains only of the following symptoms, and all other reviewed systems are negative.  Fatigue Palpitations Itching Snoring Easy bruising Joint pain Numbness Insomnia Sleepiness  Blood pressure 120/73, pulse 80, weight  204 lb (92.534 kg).  Physical Exam  General: The patient is alert and cooperative at the time of the examination. The patient is minimally obese.  Skin: No significant peripheral edema is noted.   Neurologic Exam  Cranial nerves: Facial symmetry is present. Speech is normal, no aphasia or dysarthria is noted. Extraocular movements are full. Visual fields are full.  Motor: The patient has good strength in all 4 extremities.  Coordination: The patient has good finger-nose-finger and heel-to-shin bilaterally.  Gait and station: The patient has a normal gait. Tandem gait is normal. Romberg is negative. No drift is seen.  Reflexes: Deep tendon reflexes are symmetric.   Assessment/Plan:  1. History of headache  2. Dizziness, resolved  3. Right arm numbness  The patient has been doing quite well with the dizziness and headache. The patient has a small area of numbness around the right elbow, and we will watch this conservatively for now. If this worsens, the patient will contact our office. Otherwise, the patient appears to have no active neurologic issues at this point. The patient will followup through this office if needed.  Marlan Palau MD 01/17/2013 7:49 PM  [facility] [address] [place], [place] [address]  Phone [phone] Fax [phone]

## 2013-01-21 ENCOUNTER — Other Ambulatory Visit: Payer: Self-pay | Admitting: Family Medicine

## 2013-01-21 DIAGNOSIS — I10 Essential (primary) hypertension: Secondary | ICD-10-CM

## 2013-01-21 NOTE — Telephone Encounter (Signed)
Please schedule follow up in 6 mo and refil until then, thanks

## 2013-01-21 NOTE — Telephone Encounter (Signed)
Received fax refill request,no recent appt and no future appt, please advise 

## 2013-01-22 NOTE — Telephone Encounter (Signed)
Left voicemail requesting pt to call office 

## 2013-01-23 ENCOUNTER — Encounter: Payer: Self-pay | Admitting: *Deleted

## 2013-01-23 MED ORDER — LEVOTHYROXINE SODIUM 50 MCG PO TABS: 50.0000 ug | ORAL_TABLET | Freq: Every day | ORAL | Status: DC

## 2013-01-23 MED ORDER — POTASSIUM CHLORIDE ER 10 MEQ PO TBCR: 5.0000 meq | EXTENDED_RELEASE_TABLET | Freq: Every day | ORAL | Status: DC

## 2013-01-23 MED ORDER — RAMIPRIL 5 MG PO CAPS: 5.0000 mg | ORAL_CAPSULE | Freq: Every day | ORAL | Status: DC

## 2013-01-23 MED ORDER — HYDROCHLOROTHIAZIDE 25 MG PO TABS: 12.5000 mg | ORAL_TABLET | Freq: Every day | ORAL | Status: DC

## 2013-01-23 NOTE — Telephone Encounter (Signed)
Opened in error,rx already requested electronically.

## 2013-01-23 NOTE — Telephone Encounter (Signed)
appt scheduled and meds refilled, pt is going to run out of her ramipril before she gets the mail order delivery so I sent 1 month supply to local pharmacy

## 2013-01-23 NOTE — Telephone Encounter (Signed)
Pt left v/m requesting cb [id number].

## 2013-02-17 ENCOUNTER — Encounter: Payer: Self-pay | Admitting: Family Medicine

## 2013-02-17 ENCOUNTER — Ambulatory Visit (INDEPENDENT_AMBULATORY_CARE_PROVIDER_SITE_OTHER): Payer: Medicare Other | Admitting: Family Medicine

## 2013-02-17 VITALS — BP 106/68 | HR 85 | Temp 98.5°F | Ht 66.5 in | Wt 209.5 lb

## 2013-02-17 DIAGNOSIS — K148 Other diseases of tongue: Secondary | ICD-10-CM

## 2013-02-17 MED ORDER — MAGIC MOUTHWASH W/LIDOCAINE: 5.0000 mL | Freq: Three times a day (TID) | ORAL | Status: DC | PRN

## 2013-02-17 MED ORDER — ZOLPIDEM TARTRATE 10 MG PO TABS: 10.0000 mg | ORAL_TABLET | Freq: Every evening | ORAL | Status: DC | PRN

## 2013-02-17 NOTE — Assessment & Plan Note (Signed)
R sided linear/ oval shaped lesion with white border about 1 cm  This resembles an area of trauma and does correspond to her partial tooth replacement -however she is not aware of trauma Will tx with magic mouthwash with lidocaine and if not imp 1 wk- f/u with ENT for consideration of bx

## 2013-02-17 NOTE — Progress Notes (Signed)
Subjective:    Patient ID: [name], female    DOB: July 01, 1951, 62 y.o.   MRN: [id number]  HPI Painful spot on tongue - several weeks ago- went away and came back  Is white in color  Sensitive to food/ beverage   (better with warm things) ? If she could have bitten her tongue She wears a partial - but does not think it is rubbing on it   Patient Active Problem List   Diagnosis Date Noted  . Tongue lesion 02/17/2013  . Dizziness and giddiness 01/16/2013  . Disturbance of skin sensation 01/16/2013  . RLS (restless legs syndrome) 01/15/2013  . Hx of Clostridium difficile infection 10/10/2012  . Acute bronchitis 08/19/2012  . Blood in stool 07/31/2012  . Bronchiectasis without acute exacerbation 04/29/2012  . Dyspnea on exertion 04/02/2012  . Non-ischemic cardiomyopathy 03/07/2012  . Colon cancer screening 01/11/2012  . Post-menopausal 01/11/2012  . Chest pain 11/07/2011  . Palpitations 04/17/2011  . Abnormal tympanic membrane 01/22/2011  . SLEEP APNEA, OBSTRUCTIVE 04/04/2010  . VENTRICULAR TACHYCARDIA 01/03/2009  . ICD  [employer]  Single chamber 01/03/2009  . PATENT FORAMEN OVALE 09/29/2008  . UNSPECIFIED VITAMIN D DEFICIENCY 08/17/2008  . HYPOTHYROIDISM 11/18/2006  . HYPERLIPIDEMIA 11/18/2006  . DEPRESSION 11/18/2006  . HYPERTENSION 11/18/2006  . MITRAL VALVE PROLAPSE 11/18/2006  . CEREBRAL ANEURYSM 11/18/2006  . ALLERGIC RHINITIS 11/18/2006  . GERD 11/18/2006  . DIVERTICULOSIS, COLON 11/18/2006  . FATTY LIVER DISEASE 11/18/2006  . FIBROMYALGIA 11/18/2006   Past Medical History  Diagnosis Date  . Automatic implantable cardiac defibrillator in situ   . Paroxysmal ventricular tachycardia   . Ostium secundum type atrial septal defect   . Unspecified transient cerebral ischemia   . Takotsubo syndrome   . Otalgia, unspecified   . Other malaise and fatigue   . Unspecified vitamin D deficiency   . Headache(784.0)   . ICD (implantable cardiac  defibrillator), single, BSX   . Other pulmonary embolism and infarction   . Mitral valve disorders   . Other chronic nonalcoholic liver disease   . Myalgia and myositis, unspecified   . Cerebral aneurysm, nonruptured   . Hypothyroidism   . Unspecified essential hypertension   . Other and unspecified hyperlipidemia   . Esophageal reflux   . Diverticulosis of colon (without mention of hemorrhage)   . Depressive disorder, not elsewhere classified   . Allergic rhinitis, cause unspecified   . Enlargement of lymph nodes   . Obstructive sleep apnea (adult) (pediatric)   . GI bleed   . PUD (peptic ulcer disease)   . Clostridium difficile infection    Past Surgical History  Procedure Laterality Date  . Partial hysterectomy      Fibroids  . Cerebral aneurysm repair  02/1999  . Knee arthroscopy      bilateral  . Cardiac defibrillator placement      Guidant  . Adenosine cardiolite  2003    neg, EF 50%  . Carotid dopplers  2006    neg  . Hospitalization  2004    GI bleed, PUD, diverticulosis (EGD,colonscopy)  . Hospitalization      PE, NSVT, s/p defib  . Abi  2006    normal   History  Substance Use Topics  . Smoking status: Former Smoker -- 1.00 packs/day for 15 years    Types: Cigarettes    Quit date: 08/20/1989  . Smokeless tobacco: Never Used     Comment: Started at 14; less than 1  PPD  . Alcohol Use: Yes     Comment: Rare   Family History  Problem Relation Age of Onset  . Diabetes Mother   . Alzheimer's disease Mother   . Diabetes Brother   . Cardiomyopathy      Family history  . Other Brother     Thyroid problem  . Allergies Sister   . Heart disease Sister   . Heart disease Brother   . Heart disease Father   . Colon cancer Maternal Grandmother   . Lung cancer Maternal Grandfather    Allergies  Allergen Reactions  . Amitriptyline Hcl     REACTION: ? reaction  . Atorvastatin     REACTION: severe muscle pain  . Cetirizine Hcl     REACTION: sleepy  .  Codeine     REACTION: itching  . Paroxetine     REACTION: ? reaction  . Rosuvastatin     REACTION: severe muscle pain  . Carbamazepine Rash    REACTION: ? reaction  . Dilantin (Phenytoin Sodium Extended) Rash   Current Outpatient Prescriptions on File Prior to Visit  Medication Sig Dispense Refill  . acetaminophen (TYLENOL) 500 MG tablet Take 500 mg by mouth every 6 (six) hours as needed. For pain      . aspirin 81 MG tablet Take 81 mg by mouth daily.        [name] azelastine (ASTELIN) 137 MCG/SPRAY nasal spray Place 2 sprays into the nose 2 (two) times daily. Use in each nostril as directed  90 mL  3  . bifidobacterium infantis (ALIGN) capsule Take 1 capsule by mouth daily.  14 capsule  0  . carvedilol (COREG) 6.25 MG tablet Take 1 tablet (6.25 mg total) by mouth 2 (two) times daily.  180 tablet  2  . chlorpheniramine-HYDROcodone (TUSSIONEX PENNKINETIC ER) 10-8 MG/5ML LQCR Take 5 mLs by mouth every 12 (twelve) hours as needed (cough).  140 mL  0  . diclofenac sodium (VOLTAREN) 1 % GEL Apply 1 application topically daily as needed. For pain      . famotidine (PEPCID) 20 MG tablet Take 1 tablet (20 mg total) by mouth 2 (two) times daily.  60 tablet  6  . hydrochlorothiazide (HYDRODIURIL) 25 MG tablet Take 0.5 tablets (12.5 mg total) by mouth daily.  45 tablet  1  . latanoprost (XALATAN) 0.005 % ophthalmic solution Place 1 drop into both eyes at bedtime.      [name] levothyroxine (SYNTHROID, LEVOTHROID) 50 MCG tablet Take 1 tablet (50 mcg total) by mouth daily.  90 tablet  1  . pantoprazole (PROTONIX) 40 MG tablet Take 1 tablet (40 mg total) by mouth daily.  30 tablet  3  . potassium chloride (K-DUR) 10 MEQ tablet Take 0.5 tablets (5 mEq total) by mouth daily. 1/2 tab po qd  45 tablet  1  . ramipril (ALTACE) 5 MG capsule Take 1 capsule (5 mg total) by mouth daily.  90 capsule  1  . ramipril (ALTACE) 5 MG capsule Take 1 capsule (5 mg total) by mouth daily.  30 capsule  0  . simvastatin (ZOCOR) 40 MG  tablet Take 0.5 tablets (20 mg total) by mouth at bedtime.  90 tablet  3  . traMADol (ULTRAM) 50 MG tablet Take 50 mg by mouth every 8 (eight) hours as needed. For pain       No current facility-administered medications on file prior to visit.    Review of Systems Review of Systems  Constitutional:  Negative for fever, appetite change, fatigue and unexpected weight change.  ENT neg for ST or nasal congestion or sinus pain  Eyes: Negative for pain and visual disturbance.  Respiratory: Negative for cough and shortness of breath.   Cardiovascular: Negative for cp or palpitations    Gastrointestinal: Negative for nausea, diarrhea and constipation.  Genitourinary: Negative for urgency and frequency.  Skin: Negative for pallor or rash   Neurological: Negative for weakness, light-headedness, numbness and headaches.  Hematological: Negative for adenopathy. Does not bruise/bleed easily.  Psychiatric/Behavioral: Negative for dysphoric mood. The patient is not nervous/anxious.         Objective:   Physical Exam  Constitutional: She appears well-developed and well-nourished. No distress.  obese and well appearing   HENT:  Head: Normocephalic and atraumatic.  Right Ear: External ear normal.  Left Ear: External ear normal.  Nose: Nose normal.  Lesion on R lateral tongue oval in shape with white border- about 1 cm in length  This corresponds to area of partial tooth replacement on the same side   Cardiovascular: Normal rate and regular rhythm.   Neurological: She is alert. No cranial nerve deficit.  Skin: Skin is warm and dry. No rash noted.  Psychiatric: She has a normal mood and affect.          Assessment & Plan:

## 2013-02-17 NOTE — Patient Instructions (Addendum)
Use the "magic mouthwash" 1 tsp swish and spit up to three times daily as needed If the mouth lesion does not heal in about 1 week - call and we will set up appt with ENT (or you can make your own appt)  Try not to bite tongue and make sure dental work fits correctly  Also avoid acidic foods and beverages

## 2013-02-19 ENCOUNTER — Other Ambulatory Visit: Payer: Self-pay | Admitting: *Deleted

## 2013-02-19 DIAGNOSIS — Z95810 Presence of automatic (implantable) cardiac defibrillator: Secondary | ICD-10-CM

## 2013-02-19 DIAGNOSIS — I472 Ventricular tachycardia, unspecified: Secondary | ICD-10-CM

## 2013-02-19 DIAGNOSIS — I428 Other cardiomyopathies: Secondary | ICD-10-CM

## 2013-02-19 MED ORDER — CARVEDILOL 6.25 MG PO TABS: 6.2500 mg | ORAL_TABLET | Freq: Two times a day (BID) | ORAL | Status: DC

## 2013-03-25 ENCOUNTER — Other Ambulatory Visit: Payer: Self-pay | Admitting: Family Medicine

## 2013-04-16 ENCOUNTER — Ambulatory Visit (INDEPENDENT_AMBULATORY_CARE_PROVIDER_SITE_OTHER): Payer: Medicare Other | Admitting: *Deleted

## 2013-04-16 DIAGNOSIS — Z95810 Presence of automatic (implantable) cardiac defibrillator: Secondary | ICD-10-CM

## 2013-04-16 DIAGNOSIS — I428 Other cardiomyopathies: Secondary | ICD-10-CM

## 2013-04-17 LAB — REMOTE ICD DEVICE
HV IMPEDENCE: 62 Ohm
RV LEAD AMPLITUDE: 13.5 mv
RV LEAD IMPEDENCE ICD: 647 Ohm
TZAT-0001FASTVT: 1
TZAT-0001SLOWVT: 2
TZAT-0002SLOWVT: NEGATIVE
TZAT-0013FASTVT: 1
TZAT-0018FASTVT: NEGATIVE
TZON-0003SLOWVT: 352.9 ms
TZST-0001FASTVT: 3
TZST-0001FASTVT: 4
TZST-0001FASTVT: 7
TZST-0001FASTVT: 8
TZST-0001SLOWVT: 4
TZST-0001SLOWVT: 5
TZST-0001SLOWVT: 6
TZST-0002SLOWVT: NEGATIVE
TZST-0002SLOWVT: NEGATIVE
TZST-0003FASTVT: 23 J
TZST-0003FASTVT: 41 J
VENTRICULAR PACING ICD: 0 pct

## 2013-05-11 ENCOUNTER — Telehealth: Payer: Self-pay | Admitting: Internal Medicine

## 2013-05-11 NOTE — Telephone Encounter (Signed)
Pt having knee pain, taking large dose of asa, one 325mg  tab twice a day, is this ok, pls call [phone]

## 2013-05-11 NOTE — Telephone Encounter (Signed)
Pt states she has been taking aspirin 325mg  two times a day for knee pain. I confirmed that she is not on any other blood thinners and advised short term that would probably be OK, that I would forward to Dr Graciela Husbands for further review. I suggested pt call provider treating her knee pain and ask for long term recommendations for knee pain management.

## 2013-05-13 ENCOUNTER — Other Ambulatory Visit: Payer: Self-pay | Admitting: Family Medicine

## 2013-05-14 ENCOUNTER — Telehealth: Payer: Self-pay | Admitting: *Deleted

## 2013-05-14 MED ORDER — ZOLPIDEM TARTRATE 10 MG PO TABS: 10.0000 mg | ORAL_TABLET | Freq: Every evening | ORAL | Status: DC | PRN

## 2013-05-14 NOTE — Telephone Encounter (Signed)
Rx called in as prescribed 

## 2013-05-14 NOTE — Telephone Encounter (Signed)
Pt sent the front desk a message through mychart asking to schedule a pap-smear appt, is it okay to schedule an appt for a pap?

## 2013-05-14 NOTE — Telephone Encounter (Signed)
Pt already has a f/u scheduled for 06/23/13, pt said we can do the gyn exam then

## 2013-05-14 NOTE — Telephone Encounter (Signed)
She had a partial hysterectomy - so we will likely do a gyn exam without the pap- depending on her history That is fine  15 min for gyn exam 30 min if she wants a full PE

## 2013-05-14 NOTE — Telephone Encounter (Signed)
Px written for call in   

## 2013-05-20 ENCOUNTER — Ambulatory Visit (INDEPENDENT_AMBULATORY_CARE_PROVIDER_SITE_OTHER): Payer: Medicare Other

## 2013-05-20 DIAGNOSIS — Z23 Encounter for immunization: Secondary | ICD-10-CM

## 2013-05-21 ENCOUNTER — Encounter: Payer: Self-pay | Admitting: Internal Medicine

## 2013-06-03 ENCOUNTER — Ambulatory Visit: Payer: Medicare Other

## 2013-06-16 ENCOUNTER — Telehealth: Payer: Self-pay | Admitting: Family Medicine

## 2013-06-16 DIAGNOSIS — E559 Vitamin D deficiency, unspecified: Secondary | ICD-10-CM

## 2013-06-16 DIAGNOSIS — K7689 Other specified diseases of liver: Secondary | ICD-10-CM

## 2013-06-16 DIAGNOSIS — E039 Hypothyroidism, unspecified: Secondary | ICD-10-CM

## 2013-06-16 DIAGNOSIS — E785 Hyperlipidemia, unspecified: Secondary | ICD-10-CM

## 2013-06-16 DIAGNOSIS — I10 Essential (primary) hypertension: Secondary | ICD-10-CM

## 2013-06-16 NOTE — Telephone Encounter (Signed)
Message copied by Judy Pimple on Tue Jun 16, 2013  8:15 AM ------      Message from: Alvina Chou      Created: Wed Jun 10, 2013  5:00 PM      Regarding: Lab orders for Thursday, 10.30.14       Labs for f/u       ------

## 2013-06-17 ENCOUNTER — Encounter: Payer: Self-pay | Admitting: Radiology

## 2013-06-18 ENCOUNTER — Other Ambulatory Visit (INDEPENDENT_AMBULATORY_CARE_PROVIDER_SITE_OTHER): Payer: Medicare Other

## 2013-06-18 DIAGNOSIS — I10 Essential (primary) hypertension: Secondary | ICD-10-CM

## 2013-06-18 DIAGNOSIS — K7689 Other specified diseases of liver: Secondary | ICD-10-CM

## 2013-06-18 DIAGNOSIS — E785 Hyperlipidemia, unspecified: Secondary | ICD-10-CM

## 2013-06-18 DIAGNOSIS — E039 Hypothyroidism, unspecified: Secondary | ICD-10-CM

## 2013-06-18 DIAGNOSIS — E559 Vitamin D deficiency, unspecified: Secondary | ICD-10-CM

## 2013-06-18 DIAGNOSIS — N390 Urinary tract infection, site not specified: Secondary | ICD-10-CM

## 2013-06-18 LAB — COMPREHENSIVE METABOLIC PANEL
ALT: 25 U/L (ref 0–35)
AST: 33 U/L (ref 0–37)
Albumin: 4.1 g/dL (ref 3.5–5.2)
Alkaline Phosphatase: 104 U/L (ref 39–117)
CO2: 28 mEq/L (ref 19–32)
Chloride: 102 mEq/L (ref 96–112)
Creatinine, Ser: 0.8 mg/dL (ref 0.4–1.2)
GFR: 87.59 mL/min (ref >60.00)
Glucose, Bld: 112 mg/dL — ABNORMAL HIGH (ref 70–99)
Potassium: 4.1 mEq/L (ref 3.5–5.1)
Sodium: 138 mEq/L (ref 135–145)
Total Bilirubin: 0.6 mg/dL (ref 0.3–1.2)
Total Protein: 8 g/dL (ref 6.0–8.3)

## 2013-06-18 LAB — CBC WITH DIFFERENTIAL/PLATELET
Basophils Absolute: 0 10*3/uL (ref 0.0–0.1)
Eosinophils Absolute: 0.3 10*3/uL (ref 0.0–0.7)
Eosinophils Relative: 8 % — ABNORMAL HIGH (ref 0.0–5.0)
HCT: 37.5 % (ref 36.0–46.0)
Hemoglobin: 12.3 g/dL (ref 12.0–15.0)
Lymphs Abs: 0.9 10*3/uL (ref 0.7–4.0)
MCHC: 32.7 g/dL (ref 30.0–36.0)
MCV: 87.4 fl (ref 78.0–100.0)
Monocytes Absolute: 0.4 10*3/uL (ref 0.1–1.0)
Monocytes Relative: 10.4 % (ref 3.0–12.0)
Neutro Abs: 2.5 10*3/uL (ref 1.4–7.7)
Platelets: 261 10*3/uL (ref 150.0–400.0)
RBC: 4.29 Mil/uL (ref 3.87–5.11)
RDW: 13 % (ref 11.5–14.6)
WBC: 4.1 10*3/uL — ABNORMAL LOW (ref 4.5–10.5)

## 2013-06-18 LAB — LIPID PANEL
Cholesterol: 148 mg/dL (ref 0–200)
HDL: 46.6 mg/dL (ref >39.00)
LDL Cholesterol: 80 mg/dL (ref 0–99)
Total CHOL/HDL Ratio: 3
VLDL: 21.2 mg/dL (ref 0.0–40.0)

## 2013-06-18 LAB — TSH: TSH: 0.96 u[IU]/mL (ref 0.35–5.50)

## 2013-06-19 LAB — VITAMIN D 25 HYDROXY (VIT D DEFICIENCY, FRACTURES): Vit D, 25-Hydroxy: 51 ng/mL (ref 30–89)

## 2013-06-23 ENCOUNTER — Encounter: Payer: Self-pay | Admitting: Family Medicine

## 2013-06-23 ENCOUNTER — Ambulatory Visit (INDEPENDENT_AMBULATORY_CARE_PROVIDER_SITE_OTHER): Payer: Medicare Other | Admitting: Family Medicine

## 2013-06-23 VITALS — BP 124/68 | HR 68 | Temp 97.8°F | Ht 66.5 in | Wt 200.0 lb

## 2013-06-23 DIAGNOSIS — E785 Hyperlipidemia, unspecified: Secondary | ICD-10-CM

## 2013-06-23 DIAGNOSIS — R7309 Other abnormal glucose: Secondary | ICD-10-CM

## 2013-06-23 DIAGNOSIS — R7303 Prediabetes: Secondary | ICD-10-CM | POA: Insufficient documentation

## 2013-06-23 DIAGNOSIS — Z0000 Encounter for general adult medical examination without abnormal findings: Secondary | ICD-10-CM

## 2013-06-23 DIAGNOSIS — E559 Vitamin D deficiency, unspecified: Secondary | ICD-10-CM

## 2013-06-23 DIAGNOSIS — R739 Hyperglycemia, unspecified: Secondary | ICD-10-CM

## 2013-06-23 DIAGNOSIS — E039 Hypothyroidism, unspecified: Secondary | ICD-10-CM

## 2013-06-23 DIAGNOSIS — I10 Essential (primary) hypertension: Secondary | ICD-10-CM

## 2013-06-23 NOTE — Progress Notes (Signed)
Subjective:    Patient ID: [name], female    DOB: 1950/09/04, 62 y.o.   MRN: [id number]  HPI Here for disc of health mt/ chronic problems and gyn routine   Wt is down 9 lb with bmi of 31 She has been going to meetings for the [employer] program  Teaching her good habits - eating less and eating healthier  No exercise - has to drop off and pick up her grand daughter - that takes away her exercise time  She wants to try some videos at home    Pap 03 No hx of abnormal paps or symptoms  Partial hyst - a while back  Feels fine - does not think she needs an exam  Mammogram 2/14 - normal  Self exam- no breast lumps , but occasionally her L breast is tender Not a caffeine drinker   Flu vaccine 10/14  colonosc 4/14 - thinks she has a 5 year recall  She will have recall for EGD in 2 years-watching this carefully   She had cdiff a while back   Td 1/11  Zoster status -had her vaccine   dexa nl 6/13- in normal range D level is 51- is taking enough vitamin D  Hyperlipidemia Lab Results  Component Value Date   CHOL 148 06/18/2013   CHOL 164 01/03/2012   CHOL 157 09/11/2010   Lab Results  Component Value Date   HDL 46.60 06/18/2013   HDL 60.60 01/03/2012   HDL 11.91 09/11/2010   Lab Results  Component Value Date   LDLCALC 80 06/18/2013   LDLCALC 80 01/03/2012   LDLCALC 89 09/11/2010   Lab Results  Component Value Date   TRIG 106.0 06/18/2013   TRIG 115.0 01/03/2012   TRIG 128.0 09/11/2010   Lab Results  Component Value Date   CHOLHDL 3 06/18/2013   CHOLHDL 3 01/03/2012   CHOLHDL 4 09/11/2010   Lab Results  Component Value Date   LDLDIRECT 141.8 07/04/2007   overall a good profile - and her HDL down due to less exercise   Hypothyroidism  Pt has no clinical changes No change in energy level/ hair or skin/ edema and no tremor  (was tired from back med for a while)  Lab Results  Component Value Date   TSH 0.96 06/18/2013       Chemistry      Component  Value Date/Time   NA 138 06/18/2013 1001   K 4.1 06/18/2013 1001   CL 102 06/18/2013 1001   CO2 28 06/18/2013 1001   BUN 10 06/18/2013 1001   CREATININE 0.8 06/18/2013 1001      Component Value Date/Time   CALCIUM 10.0 06/18/2013 1001   ALKPHOS 104 06/18/2013 1001   AST 33 06/18/2013 1001   ALT 25 06/18/2013 1001   BILITOT 0.6 06/18/2013 1001     liver enzymes is in normal range    Sugar level 112   Had a bx on her tongue that was normal  She will try a different toothpaste with no mint - that was suggested   Patient Active Problem List   Diagnosis Date Noted  . Hyperglycemia 06/23/2013  . Routine general medical examination at a health care facility 06/23/2013  . Tongue lesion 02/17/2013  . Dizziness and giddiness 01/16/2013  . Disturbance of skin sensation 01/16/2013  . RLS (restless legs syndrome) 01/15/2013  . Hx of Clostridium difficile infection 10/10/2012  . Blood in stool 07/31/2012  . Bronchiectasis without acute  exacerbation 04/29/2012  . Dyspnea on exertion 04/02/2012  . Non-ischemic cardiomyopathy 03/07/2012  . Colon cancer screening 01/11/2012  . Post-menopausal 01/11/2012  . Chest pain 11/07/2011  . Palpitations 04/17/2011  . Abnormal tympanic membrane 01/22/2011  . SLEEP APNEA, OBSTRUCTIVE 04/04/2010  . VENTRICULAR TACHYCARDIA 01/03/2009  . ICD  [employer]  Single chamber 01/03/2009  . PATENT FORAMEN OVALE 09/29/2008  . UNSPECIFIED VITAMIN D DEFICIENCY 08/17/2008  . HYPOTHYROIDISM 11/18/2006  . HYPERLIPIDEMIA 11/18/2006  . DEPRESSION 11/18/2006  . HYPERTENSION 11/18/2006  . MITRAL VALVE PROLAPSE 11/18/2006  . CEREBRAL ANEURYSM 11/18/2006  . ALLERGIC RHINITIS 11/18/2006  . GERD 11/18/2006  . DIVERTICULOSIS, COLON 11/18/2006  . FATTY LIVER DISEASE 11/18/2006  . FIBROMYALGIA 11/18/2006   Past Medical History  Diagnosis Date  . Automatic implantable cardiac defibrillator in situ   . Paroxysmal ventricular tachycardia   . Ostium secundum  type atrial septal defect   . Unspecified transient cerebral ischemia   . Takotsubo syndrome   . Otalgia, unspecified   . Other malaise and fatigue   . Unspecified vitamin D deficiency   . Headache(784.0)   . ICD (implantable cardiac defibrillator), single, BSX   . Other pulmonary embolism and infarction   . Mitral valve disorders   . Other chronic nonalcoholic liver disease   . Myalgia and myositis, unspecified   . Cerebral aneurysm, nonruptured   . Hypothyroidism   . Unspecified essential hypertension   . Other and unspecified hyperlipidemia   . Esophageal reflux   . Diverticulosis of colon (without mention of hemorrhage)   . Depressive disorder, not elsewhere classified   . Allergic rhinitis, cause unspecified   . Enlargement of lymph nodes   . Obstructive sleep apnea (adult) (pediatric)   . GI bleed   . PUD (peptic ulcer disease)   . Clostridium difficile infection    Past Surgical History  Procedure Laterality Date  . Partial hysterectomy      Fibroids  . Cerebral aneurysm repair  02/1999  . Knee arthroscopy      bilateral  . Cardiac defibrillator placement      Guidant  . Adenosine cardiolite  2003    neg, EF 50%  . Carotid dopplers  2006    neg  . Hospitalization  2004    GI bleed, PUD, diverticulosis (EGD,colonscopy)  . Hospitalization      PE, NSVT, s/p defib  . Abi  2006    normal   History  Substance Use Topics  . Smoking status: Former Smoker -- 1.00 packs/day for 15 years    Types: Cigarettes    Quit date: 08/20/1989  . Smokeless tobacco: Never Used     Comment: Started at 14; less than 1 PPD  . Alcohol Use: Yes     Comment: Rare   Family History  Problem Relation Age of Onset  . Diabetes Mother   . Alzheimer's disease Mother   . Diabetes Brother   . Cardiomyopathy      Family history  . Other Brother     Thyroid problem  . Allergies Sister   . Heart disease Sister   . Heart disease Brother   . Heart disease Father   . Colon cancer  Maternal Grandmother   . Lung cancer Maternal Grandfather    Allergies  Allergen Reactions  . Amitriptyline Hcl     REACTION: ? reaction  . Atorvastatin     REACTION: severe muscle pain  . Cetirizine Hcl     REACTION: sleepy  .  Codeine     REACTION: itching  . Paroxetine     REACTION: ? reaction  . Rosuvastatin     REACTION: severe muscle pain  . Carbamazepine Rash    REACTION: ? reaction  . Dilantin [Phenytoin Sodium Extended] Rash   Current Outpatient Prescriptions on File Prior to Visit  Medication Sig Dispense Refill  . acetaminophen (TYLENOL) 500 MG tablet Take 500 mg by mouth every 6 (six) hours as needed. For pain      . aspirin 81 MG tablet Take 81 mg by mouth daily.        . carvedilol (COREG) 6.25 MG tablet Take 1 tablet (6.25 mg total) by mouth 2 (two) times daily.  180 tablet  2  . diclofenac sodium (VOLTAREN) 1 % GEL Apply 1 application topically daily as needed. For pain      . famotidine (PEPCID) 20 MG tablet Take 1 tablet (20 mg total) by mouth 2 (two) times daily.  60 tablet  6  . hydrochlorothiazide (HYDRODIURIL) 25 MG tablet Take 0.5 tablets (12.5 mg total) by mouth daily.  45 tablet  1  . latanoprost (XALATAN) 0.005 % ophthalmic solution Place 1 drop into both eyes at bedtime.      [name] levothyroxine (SYNTHROID, LEVOTHROID) 50 MCG tablet Take 1 tablet (50 mcg total) by mouth daily.  90 tablet  1  . pantoprazole (PROTONIX) 40 MG tablet Take 1 tablet (40 mg total) by mouth daily.  30 tablet  3  . potassium chloride (K-DUR) 10 MEQ tablet Take 0.5 tablets (5 mEq total) by mouth daily. 1/2 tab po qd  45 tablet  1  . ramipril (ALTACE) 5 MG capsule Take 1 capsule (5 mg total) by mouth daily.  90 capsule  1  . ramipril (ALTACE) 5 MG capsule Take 1 capsule (5 mg total) by mouth daily.  30 capsule  0  . simvastatin (ZOCOR) 40 MG tablet Take 0.5 tablets (20 mg total) by mouth at bedtime.  90 tablet  3  . traMADol (ULTRAM) 50 MG tablet Take 50 mg by mouth every 8 (eight)  hours as needed. For pain      . zolpidem (AMBIEN) 10 MG tablet Take 1 tablet (10 mg total) by mouth at bedtime as needed for sleep. For sleep  90 tablet  0   No current facility-administered medications on file prior to visit.    Review of Systems Review of Systems  Constitutional: Negative for fever, appetite change, fatigue and unexpected weight change.  Eyes: Negative for pain and visual disturbance.  Respiratory: Negative for cough and shortness of breath.   Cardiovascular: Negative for cp or palpitations    Gastrointestinal: Negative for nausea, diarrhea and constipation.  Genitourinary: Negative for urgency and frequency.  Skin: Negative for pallor or rash   Neurological: Negative for weakness, light-headedness, numbness and headaches.  Hematological: Negative for adenopathy. Does not bruise/bleed easily.  Psychiatric/Behavioral: Negative for dysphoric mood. The patient is not nervous/anxious.         Objective:   Physical Exam  Constitutional: She appears well-developed and well-nourished. No distress.  obese and well appearing   HENT:  Head: Normocephalic and atraumatic.  Right Ear: External ear normal.  Left Ear: External ear normal.  Nose: Nose normal.  Mouth/Throat: Oropharynx is clear and moist.  Eyes: Conjunctivae and EOM are normal. Pupils are equal, round, and reactive to light. Right eye exhibits no discharge. Left eye exhibits no discharge. No scleral icterus.  Neck: Normal range of  motion. Neck supple. No JVD present. No thyromegaly present.  Cardiovascular: Normal rate, regular rhythm and intact distal pulses.  Exam reveals no gallop.   Murmur heard. Pulmonary/Chest: Effort normal and breath sounds normal. No respiratory distress. She has no wheezes. She has no rales.  Abdominal: Soft. Bowel sounds are normal. She exhibits no distension and no mass. There is no tenderness.  Genitourinary: No breast swelling, tenderness, discharge or bleeding.  Breast exam: No  mass, nodules, thickening, tenderness, bulging, retraction, inflamation, nipple discharge or skin changes noted.  No axillary or clavicular LA.  Chaperoned exam.    Musculoskeletal: She exhibits no edema and no tenderness.  Lymphadenopathy:    She has no cervical adenopathy.  Neurological: She is alert. She has normal reflexes. No cranial nerve deficit. She exhibits normal muscle tone. Coordination normal.  Skin: Skin is warm and dry. No rash noted. No erythema. No pallor.  Psychiatric: She has a normal mood and affect.          Assessment & Plan:

## 2013-06-23 NOTE — Patient Instructions (Addendum)
Your mammogram is due in February- you can schedule your own  Labs are stable Keep working on weight loss  Sugar was 112 - we will continue to watch it  Avoid red meat/ fried foods/ egg yolks/ fatty breakfast meats/ butter, cheese and high fat dairy/ and shellfish   Get back to exercise when you can

## 2013-06-24 NOTE — Assessment & Plan Note (Signed)
Hypothyroidism  Pt has no clinical changes No change in energy level/ hair or skin/ edema and no tremor Lab Results  Component Value Date   TSH 0.96 06/18/2013

## 2013-06-24 NOTE — Assessment & Plan Note (Signed)
Glucose is stable At risk for DM Disc wt loss attempt and low glycemic diet

## 2013-06-24 NOTE — Assessment & Plan Note (Signed)
Disc goals for lipids and reasons to control them Rev labs with pt Rev low sat fat diet in detail Statin and diet  

## 2013-06-24 NOTE — Assessment & Plan Note (Signed)
Reviewed health habits including diet and exercise and skin cancer prevention Also reviewed health mt list, fam hx and immunizations   See [facility] labs rev Enc further wt loss

## 2013-06-24 NOTE — Assessment & Plan Note (Signed)
D level now in 50s Nl dexa

## 2013-06-24 NOTE — Assessment & Plan Note (Signed)
bp in fair control at this time  No changes needed  Disc lifstyle change with low sodium diet and exercise  Labs reviewed  

## 2013-07-07 ENCOUNTER — Encounter: Payer: Self-pay | Admitting: Family Medicine

## 2013-07-17 ENCOUNTER — Ambulatory Visit (INDEPENDENT_AMBULATORY_CARE_PROVIDER_SITE_OTHER): Payer: Medicare Other | Admitting: *Deleted

## 2013-07-17 DIAGNOSIS — I428 Other cardiomyopathies: Secondary | ICD-10-CM

## 2013-07-17 LAB — MDC_IDC_ENUM_SESS_TYPE_REMOTE
Lead Channel Impedance Value: 583 Ohm
Zone Setting Detection Interval: 250 ms
Zone Setting Detection Interval: 352.9 ms

## 2013-07-20 ENCOUNTER — Other Ambulatory Visit: Payer: Self-pay | Admitting: Family Medicine

## 2013-07-27 ENCOUNTER — Telehealth: Payer: Self-pay | Admitting: *Deleted

## 2013-07-27 MED ORDER — SIMVASTATIN 40 MG PO TABS: ORAL_TABLET | ORAL | Status: DC

## 2013-07-27 MED ORDER — HYDROCHLOROTHIAZIDE 25 MG PO TABS: ORAL_TABLET | ORAL | Status: DC

## 2013-07-27 MED ORDER — POTASSIUM CHLORIDE CRYS ER 10 MEQ PO TBCR: EXTENDED_RELEASE_TABLET | ORAL | Status: DC

## 2013-07-27 NOTE — Telephone Encounter (Signed)
Pt called office pharmacy had not received rx's for pt's meds. I informed pt that her meds were sent electronically on 07/20/13, but I would send them again, and advised her to contact us if there were any problems this time.

## 2013-08-04 ENCOUNTER — Encounter: Payer: Self-pay | Admitting: *Deleted

## 2013-08-12 ENCOUNTER — Encounter: Payer: Self-pay | Admitting: Internal Medicine

## 2013-08-18 ENCOUNTER — Other Ambulatory Visit: Payer: Self-pay | Admitting: Family Medicine

## 2013-08-18 NOTE — Telephone Encounter (Signed)
Px written for call in   

## 2013-08-18 NOTE — Telephone Encounter (Signed)
Electronic refill request, please advise  

## 2013-08-18 NOTE — Telephone Encounter (Signed)
Rx called in as prescribed 

## 2013-10-07 ENCOUNTER — Other Ambulatory Visit: Payer: Self-pay

## 2013-10-07 DIAGNOSIS — Z1231 Encounter for screening mammogram for malignant neoplasm of breast: Secondary | ICD-10-CM

## 2013-10-07 DIAGNOSIS — Z803 Family history of malignant neoplasm of breast: Secondary | ICD-10-CM

## 2013-10-15 ENCOUNTER — Other Ambulatory Visit: Payer: Self-pay | Admitting: Family Medicine

## 2013-10-15 ENCOUNTER — Encounter: Payer: Medicare Other | Admitting: *Deleted

## 2013-10-25 ENCOUNTER — Other Ambulatory Visit: Payer: Self-pay | Admitting: Family Medicine

## 2013-10-26 NOTE — Telephone Encounter (Signed)
Rx called in as prescribed 

## 2013-10-26 NOTE — Telephone Encounter (Signed)
Px written for call in   

## 2013-10-26 NOTE — Telephone Encounter (Signed)
Electronic refill request, spoke with pharmacy and the Rx that was called in on 08/18/13 they only refilled it for #30 with 1 additional refill instead of #90 so she has no more refills on file with them, please advise

## 2013-10-27 ENCOUNTER — Ambulatory Visit
Admission: RE | Admit: 2013-10-27 | Discharge: 2013-10-27 | Disposition: A | Discharge status: Home / Self Care | Payer: Medicare Other | Source: Ambulatory Visit

## 2013-10-27 ENCOUNTER — Encounter: Payer: Self-pay | Admitting: *Deleted

## 2013-10-27 DIAGNOSIS — Z1231 Encounter for screening mammogram for malignant neoplasm of breast: Secondary | ICD-10-CM

## 2013-10-27 DIAGNOSIS — Z803 Family history of malignant neoplasm of breast: Secondary | ICD-10-CM

## 2013-11-03 ENCOUNTER — Ambulatory Visit: Payer: Medicare Other | Admitting: Pulmonary Disease

## 2013-11-09 ENCOUNTER — Ambulatory Visit (INDEPENDENT_AMBULATORY_CARE_PROVIDER_SITE_OTHER): Payer: Medicare Other | Admitting: Pulmonary Disease

## 2013-11-09 ENCOUNTER — Encounter: Payer: Self-pay | Admitting: Pulmonary Disease

## 2013-11-09 ENCOUNTER — Ambulatory Visit (INDEPENDENT_AMBULATORY_CARE_PROVIDER_SITE_OTHER)
Admission: RE | Admit: 2013-11-09 | Discharge: 2013-11-09 | Disposition: A | Discharge status: Home / Self Care | Payer: Medicare Other | Source: Ambulatory Visit | Attending: Pulmonary Disease | Admitting: Pulmonary Disease

## 2013-11-09 VITALS — BP 100/68 | HR 74 | Temp 97.7°F | Ht 66.5 in | Wt 207.0 lb

## 2013-11-09 DIAGNOSIS — R0609 Other forms of dyspnea: Secondary | ICD-10-CM

## 2013-11-09 DIAGNOSIS — G4733 Obstructive sleep apnea (adult) (pediatric): Secondary | ICD-10-CM

## 2013-11-09 DIAGNOSIS — R0989 Other specified symptoms and signs involving the circulatory and respiratory systems: Secondary | ICD-10-CM

## 2013-11-09 DIAGNOSIS — G2581 Restless legs syndrome: Secondary | ICD-10-CM

## 2013-11-09 DIAGNOSIS — J479 Bronchiectasis, uncomplicated: Secondary | ICD-10-CM

## 2013-11-09 NOTE — Patient Instructions (Signed)
Continue to work on weight loss and conditioning. Try the requip as prescribed from last visit for your leg symptoms, and give me some feedback in 2-3 weeks. followup with me again in one year.

## 2013-11-09 NOTE — Assessment & Plan Note (Signed)
I have recommended the patient try the dopamine agonist as prescribed at the last visit for her persistent leg symptoms.

## 2013-11-09 NOTE — Assessment & Plan Note (Signed)
The patient needs to continue working on aggressive weight loss.

## 2013-11-09 NOTE — Assessment & Plan Note (Signed)
The patient has no significant cough, mucus, or congestion.

## 2013-11-09 NOTE — Progress Notes (Signed)
   Subjective:    Patient ID: [name], female    DOB: Jan 14, 1951, 63 y.o.   MRN: [id number]  HPI The patient comes in today for followup of her mild bronchiectasis, and as well as her mild sleep apnea and restless leg syndrome.  She feels that her breathing is stable from the last visit. And has no significant cough, congestion, or mucus production. Her chest x-ray today is stable. She feels that her dyspnea on exertion is at her usual baseline. The patient has symptoms that are most consistent with RLS, but she never tried the dopamine agonist I prescribed last visit in May. She continues to have significant symptoms both during the day and evening.   Review of Systems  Constitutional: Negative for fever and unexpected weight change.  HENT: Positive for congestion, postnasal drip, rhinorrhea, sinus pressure and sneezing. Negative for dental problem, ear pain, nosebleeds, sore throat and trouble swallowing.   Eyes: Negative for redness and itching.  Respiratory: Positive for shortness of breath. Negative for cough, chest tightness and wheezing.   Cardiovascular: Negative for palpitations and leg swelling.  Gastrointestinal: Negative for nausea and vomiting.  Genitourinary: Negative for dysuria.  Musculoskeletal: Negative for joint swelling.       Leg pain--RLS  Skin: Negative for rash.  Neurological: Positive for headaches.  Hematological: Does not bruise/bleed easily.  Psychiatric/Behavioral: Negative for dysphoric mood. The patient is not nervous/anxious.        Objective:   Physical Exam Overweight female in no acute distress Nose without purulence or discharge noted Neck without lymphadenopathy or thyromegaly Chest totally clear to auscultation, no wheezing Cardiac exam with regular rate and rhythm Lower extremities with minimal edema, no cyanosis Alert and oriented, moves all 4 extremities.       Assessment & Plan:

## 2013-11-11 ENCOUNTER — Other Ambulatory Visit: Payer: Self-pay | Admitting: Internal Medicine

## 2013-11-30 ENCOUNTER — Other Ambulatory Visit: Payer: Self-pay | Admitting: Internal Medicine

## 2013-12-22 ENCOUNTER — Encounter: Payer: Self-pay | Admitting: Internal Medicine

## 2013-12-28 ENCOUNTER — Encounter: Payer: Self-pay | Admitting: Internal Medicine

## 2013-12-28 ENCOUNTER — Ambulatory Visit (INDEPENDENT_AMBULATORY_CARE_PROVIDER_SITE_OTHER): Payer: Medicare Other | Admitting: Internal Medicine

## 2013-12-28 VITALS — BP 110/72 | HR 76 | Ht 66.5 in | Wt 204.4 lb

## 2013-12-28 DIAGNOSIS — Z86010 Personal history of colonic polyps: Secondary | ICD-10-CM

## 2013-12-28 DIAGNOSIS — K5909 Other constipation: Secondary | ICD-10-CM

## 2013-12-28 DIAGNOSIS — K319 Disease of stomach and duodenum, unspecified: Secondary | ICD-10-CM

## 2013-12-28 DIAGNOSIS — K5900 Constipation, unspecified: Secondary | ICD-10-CM

## 2013-12-28 DIAGNOSIS — K31A0 Gastric intestinal metaplasia, unspecified: Secondary | ICD-10-CM

## 2013-12-28 DIAGNOSIS — K219 Gastro-esophageal reflux disease without esophagitis: Secondary | ICD-10-CM

## 2013-12-28 DIAGNOSIS — K3189 Other diseases of stomach and duodenum: Secondary | ICD-10-CM

## 2013-12-28 MED ORDER — PANTOPRAZOLE SODIUM 40 MG PO TBEC: 40.0000 mg | DELAYED_RELEASE_TABLET | Freq: Every day | ORAL | Status: DC

## 2013-12-28 MED ORDER — POLYETHYLENE GLYCOL 3350 17 GM/SCOOP PO POWD: 17.0000 g | Freq: Every day | ORAL | Status: DC

## 2013-12-28 NOTE — Patient Instructions (Signed)
We have sent the following medications to your pharmacy for you to pick up at your convenience: Patoprazole daily, Miralax 17g daily Discontinue pepcid.

## 2013-12-28 NOTE — Progress Notes (Signed)
   Subjective:    Patient ID: [name], female    DOB: [birth year], 63 y.o.   MRN: [id number]  HPI [name] is a 63 yo female with a past medical history of ventricular tachycardia status post ICD, adenomatous colon polyps, diverticulosis, history of C. difficile who is seen for followup. She was seen about 15 months ago in clinic. She reports overall she is feeling well. No episodes of recurrent C. difficile colitis. Bowel movements have been regular with the help of MiraLax. No blood in her stool, melena, or diarrhea. She has had issues with ulcers on the sides of her tongue bilaterally. This is been evaluated and biopsied by an oral surgeon. She reports she was benign and not precancerous. Questionably related to medication or even prior dental work.  She is avoiding certain foods and using special toothpaste. She reports her GERD as well controlled. She is using pantoprazole 40 mg daily and famotidine 20 mg in the morning. She has some questions about these medicines. She did stop pantoprazole after her last office visit due to her history of C. difficile, but it was started back and she can't remember why.  EGD was performed on 11/20/2012 -- tortuous distal esophagus was normal Z line. Small hiatal hernia. Gastritis biopsied. Normal duodenum. 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 No Helicobacter or malignancy. Colonoscopy performed on 11/20/2012 -- severe diverticulosis throughout the colon and a 4 mm tubular adenoma, external hemorrhoids  Review of Systems As per HPI, otherwise negative  Current Medications, Allergies, Past Medical History, Past Surgical History, Family History and Social History were reviewed in [employer] record.     Objective:   Physical Exam BP 110/72  Pulse 76  Ht 5' 6.5" (1.689 m)  Wt 204 lb 6.4 oz (92.715 kg)  BMI 32.50 kg/m2 Constitutional: Well-developed and well-nourished. No  distress. HEENT: Normocephalic and atraumatic. Oropharynx is clear and moist. No oropharyngeal exudate. Conjunctivae are normal.  No scleral icterus. Neck: Neck supple. Trachea midline. Cardiovascular: Normal rate, regular rhythm and intact distal pulses. No M/R/G Pulmonary/chest: Effort normal and breath sounds normal. No wheezing, rales or rhonchi. Abdominal: Soft, nontender, nondistended. Bowel sounds active throughout. [name] Extremities: no clubbing, cyanosis, or edema Neurological: Alert and oriented to person place and time. Psychiatric: Normal mood and affect. Behavior is normal.      Assessment & Plan:   63 yo female with a past medical history of ventricular tachycardia status post ICD, adenomatous colon polyps, diverticulosis, history of C. difficile who is seen for followup.   1.  GERD -- heartburn is well controlled and she is back on PPI. She is responding well to it and we will continue pantoprazole 40 mg daily. I think her risk for recurrent C. difficile oh, particularly relating to this medication, given it has been 15 months and she has had an issue. She will discontinue famotidine and use it on an as-needed basis.  2.  History of gastric metaplasia -- we discussed gastric metaplasia and how currently guidelines do not support surveillance. She does not have a family history of gastric cancer nor H. pylori. She is comfortable with this plan. She is asymptomatic currently.  3.  Constipation -- continue MiraLax on an as-needed basis  4.  Adenoma history -- repeat colonoscopy recommended 5 years from the last. She is aware of this recommendation.  Followup in one year, sooner if needed

## 2014-01-14 ENCOUNTER — Encounter: Payer: Medicare Other | Admitting: Internal Medicine

## 2014-01-29 ENCOUNTER — Other Ambulatory Visit: Payer: Self-pay | Admitting: Family Medicine

## 2014-02-11 ENCOUNTER — Encounter: Payer: Medicare Other | Admitting: Internal Medicine

## 2014-02-18 ENCOUNTER — Encounter: Payer: Self-pay | Admitting: Internal Medicine

## 2014-02-18 ENCOUNTER — Ambulatory Visit (INDEPENDENT_AMBULATORY_CARE_PROVIDER_SITE_OTHER): Payer: Medicare Other | Admitting: Internal Medicine

## 2014-02-18 VITALS — BP 134/82 | HR 63 | Ht 66.6 in | Wt 194.0 lb

## 2014-02-18 DIAGNOSIS — I4729 Other ventricular tachycardia: Secondary | ICD-10-CM

## 2014-02-18 DIAGNOSIS — I428 Other cardiomyopathies: Secondary | ICD-10-CM

## 2014-02-18 DIAGNOSIS — I472 Ventricular tachycardia: Secondary | ICD-10-CM

## 2014-02-18 DIAGNOSIS — Z95810 Presence of automatic (implantable) cardiac defibrillator: Secondary | ICD-10-CM

## 2014-02-18 LAB — MDC_IDC_ENUM_SESS_TYPE_INCLINIC
Date Time Interrogation Session: 20150702040000
HIGH POWER IMPEDANCE MEASURED VALUE: 62 Ohm
HighPow Impedance: 38 Ohm
Implantable Pulse Generator Serial Number: 266301
Lead Channel Impedance Value: 637 Ohm
Lead Channel Sensing Intrinsic Amplitude: 11.8 mV
Lead Channel Setting Sensing Sensitivity: 0.4 mV
MDC IDC MSMT LEADCHNL RV PACING THRESHOLD AMPLITUDE: 0.8 V
MDC IDC MSMT LEADCHNL RV PACING THRESHOLD PULSEWIDTH: 0.4 ms
MDC IDC SET LEADCHNL RV PACING AMPLITUDE: 2.4 V
MDC IDC SET LEADCHNL RV PACING PULSEWIDTH: 0.4 ms
MDC IDC SET ZONE DETECTION INTERVAL: 250 ms
Zone Setting Detection Interval: 286 ms
Zone Setting Detection Interval: 353 ms

## 2014-02-18 MED ORDER — POTASSIUM CHLORIDE CRYS ER 10 MEQ PO TBCR: EXTENDED_RELEASE_TABLET | ORAL | Status: DC

## 2014-02-18 MED ORDER — SIMVASTATIN 40 MG PO TABS: ORAL_TABLET | ORAL | Status: DC

## 2014-02-18 MED ORDER — HYDROCHLOROTHIAZIDE 25 MG PO TABS: ORAL_TABLET | ORAL | Status: DC

## 2014-02-18 MED ORDER — CARVEDILOL 6.25 MG PO TABS: 6.2500 mg | ORAL_TABLET | Freq: Two times a day (BID) | ORAL | Status: DC

## 2014-02-18 NOTE — Patient Instructions (Addendum)
Your physician recommends that you continue on your current medications as directed. Please refer to the Current Medication list given to you today.  Remote monitoring is used to monitor your ICD from home. This monitoring reduces the number of office visits required to check your device to one time per year. It allows us to keep an eye on the functioning of your device to ensure it is working properly. You are scheduled for a device check from home on 05-24-2014. You may send your transmission at any time that day. If you have a wireless device, the transmission will be sent automatically. After your physician reviews your transmission, you will receive a postcard with your next transmission date.  Your physician recommends that you schedule a follow-up appointment in: 12 months with Dr.Klein.    

## 2014-02-18 NOTE — Progress Notes (Signed)
Patient Care Team: Abner Greenspan, MD as PCP - General   HPI  [name] is a 63 y.o. 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 Automatic implantable cardiac defibrillator in situ   . Paroxysmal ventricular tachycardia   . Ostium secundum type atrial septal defect   . Unspecified transient cerebral ischemia   . Takotsubo syndrome   . Otalgia, unspecified   . Other malaise and fatigue   . Unspecified vitamin D deficiency   . Headache(784.0)   . ICD (implantable cardiac defibrillator), single, BSX   . Other pulmonary embolism and infarction   . Mitral valve disorders   . Other chronic nonalcoholic liver disease   . Myalgia and myositis, unspecified   . Cerebral aneurysm, nonruptured   . Hypothyroidism   . Unspecified essential hypertension   . Other and unspecified hyperlipidemia   . Esophageal reflux   . Diverticulosis of colon (without mention of hemorrhage)   . Depressive disorder, not elsewhere classified   . Allergic rhinitis, cause unspecified   . Enlargement of lymph nodes   . Obstructive sleep apnea (adult) (pediatric)   . GI bleed   . PUD (peptic ulcer disease)   . Clostridium difficile infection     Past Surgical History  Procedure Laterality Date  . Partial hysterectomy      Fibroids  . Cerebral aneurysm repair  02/1999  . Knee arthroscopy      bilateral  . Cardiac defibrillator placement      Guidant  . Adenosine cardiolite  2003    neg, EF 50%  . Carotid  dopplers  2006    neg  . Hospitalization  2004    GI bleed, PUD, diverticulosis (EGD,colonscopy)  . Hospitalization      PE, NSVT, s/p defib  . Abi  2006    normal    Current Outpatient Prescriptions  Medication Sig Dispense Refill  . acetaminophen (TYLENOL) 500 MG tablet Take 500 mg by mouth every 6 (six) hours as needed. For pain      . aspirin 81 MG tablet Take 81 mg by mouth daily.        . carvedilol (COREG) 6.25 MG tablet TAKE 1 TABLET (6.25 MG TOTAL) BY MOUTH 2 (TWO) TIMES DAILY.  180 tablet  0  . diclofenac sodium (VOLTAREN) 1 % GEL Apply 1 application topically daily as needed. For pain      . hydrochlorothiazide (HYDRODIURIL) 25 MG tablet TAKE 1/2 TABLET (12.5MG )   DAILY  45 tablet  1  . latanoprost (XALATAN) 0.005 % ophthalmic solution Place 1 drop into both eyes at bedtime.      . pantoprazole (PROTONIX) 40 MG tablet Take 1 tablet (40 mg total) by mouth daily.  30 tablet  3  . polyethylene glycol powder (GLYCOLAX/MIRALAX) powder Take 17 g by mouth daily.  255 g  3  . potassium chloride (KLOR-CON M10) 10 MEQ tablet TAKE 1/2 (5 MEQ TOTAL)     TABLET DAILY  45 tablet  1  .  simvastatin (ZOCOR) 40 MG tablet TAKE 1/2 TABLET DAILY  45 tablet  1  . SYNTHROID 50 MCG tablet TAKE 1 TABLET DAILY  90 tablet  1  . traMADol (ULTRAM) 50 MG tablet Take 50 mg by mouth every 8 (eight) hours as needed. For pain      . zolpidem (AMBIEN) 10 MG tablet TAKE 1 TABLET AT BEDTIME AS NEEDED FOR SLEEP  90 tablet  1  . [DISCONTINUED] potassium chloride (K-DUR) 10 MEQ tablet Take 0.5 tablets (5 mEq total) by mouth daily. 1/2 tab po qd  45 tablet  1   No current facility-administered medications for this visit.    Allergies  Allergen Reactions  . Amitriptyline Hcl     REACTION: ? reaction  . Atorvastatin     REACTION: severe muscle pain  . Cetirizine Hcl     REACTION: sleepy  . Codeine     REACTION: itching  . Paroxetine     REACTION: ? reaction  . Rosuvastatin     REACTION: severe muscle pain   . Carbamazepine Rash    REACTION: ? reaction  . Dilantin [Phenytoin Sodium Extended] Rash    Review of Systems negative except from HPI and PMH  Physical Exam BP 134/82  Pulse 63  Ht 5' 6.6" (1.692 m)  Wt 194 lb (87.998 kg)  BMI 30.74 kg/m2 Well developed and well nourished in no acute distress HENT normal E scleral and icterus clear Neck Supple JVP flat; carotids brisk and full Clear to ausculation Device pocket well healed; without hematoma or erythema.  There is no tethering  Regular rate and rhythm, no murmurs gallops or rub Soft with active bowel sounds No clubbing cyanosis  Edema Alert and oriented, grossly normal motor and sensory function Skin Warm and Dry    Assessment and  Plan  Nonischemic Cardiomyopathy  CHF  Chronic mixed   Ventricular Tachycardia No intercurrent Ventricular tachycardia  ACE inhibitor allergy  Hypertension  ICD [employer]  The patient's device was interrogated.  The information was reviewed. No changes were made in the programming.    She had a positive tongue biopsy for an allergic reaction to her ramapril. We will continue her on carvedilol.  She is under a great deal of stress with her granddaughter, [place]

## 2014-02-24 ENCOUNTER — Ambulatory Visit: Payer: Medicare Other | Admitting: Pulmonary Disease

## 2014-03-11 ENCOUNTER — Telehealth: Payer: Self-pay | Admitting: Internal Medicine

## 2014-03-11 NOTE — Telephone Encounter (Signed)
Informed patient that these were normal numbers for both BP & HR.  She has no symptoms beside feeling her heart is beating faster than normal. She also states that she is under some stress right now and we discussed how this may be related.  She will call back if problems worsen/becomes symptomatic. She is agreeable to this.

## 2014-03-11 NOTE — Telephone Encounter (Signed)
New message           Pt bp is 119/84 with a heart rate of 89 / pt would like a call from nurse concerning this

## 2014-03-29 ENCOUNTER — Other Ambulatory Visit: Payer: Self-pay | Admitting: Family Medicine

## 2014-04-12 ENCOUNTER — Other Ambulatory Visit: Payer: Self-pay | Admitting: Family Medicine

## 2014-04-13 NOTE — Telephone Encounter (Signed)
Electronic refill request, please advise  

## 2014-04-13 NOTE — Telephone Encounter (Signed)
This is about 9 days early if I am not wrong - if that is correct they can call back next week  Let me know if I am incorrect

## 2014-04-14 NOTE — Telephone Encounter (Signed)
It's not due until 04/28/14 Rx declined

## 2014-04-20 ENCOUNTER — Other Ambulatory Visit: Payer: Self-pay

## 2014-04-20 MED ORDER — ZOLPIDEM TARTRATE 10 MG PO TABS: ORAL_TABLET | ORAL | Status: DC

## 2014-04-20 NOTE — Telephone Encounter (Signed)
Px written for call in   

## 2014-04-20 NOTE — Telephone Encounter (Signed)
Pt left v/m requesting refill zolpidem to [facility].Please advise.

## 2014-04-20 NOTE — Telephone Encounter (Signed)
Rx called in as prescribed 

## 2014-05-06 ENCOUNTER — Ambulatory Visit (INDEPENDENT_AMBULATORY_CARE_PROVIDER_SITE_OTHER): Payer: Medicare Other

## 2014-05-06 DIAGNOSIS — Z23 Encounter for immunization: Secondary | ICD-10-CM

## 2014-05-24 ENCOUNTER — Encounter: Payer: Self-pay | Admitting: Internal Medicine

## 2014-05-24 ENCOUNTER — Ambulatory Visit (INDEPENDENT_AMBULATORY_CARE_PROVIDER_SITE_OTHER): Payer: Medicare Other | Admitting: *Deleted

## 2014-05-24 DIAGNOSIS — I4729 Other ventricular tachycardia: Secondary | ICD-10-CM

## 2014-05-24 DIAGNOSIS — I472 Ventricular tachycardia: Secondary | ICD-10-CM

## 2014-05-24 LAB — MDC_IDC_ENUM_SESS_TYPE_REMOTE
Battery Remaining Longevity: 108 mo
Brady Statistic RV Percent Paced: 0 %
HighPow Impedance: 58 Ohm
Lead Channel Setting Pacing Amplitude: 2.4 V
MDC IDC MSMT LEADCHNL RV IMPEDANCE VALUE: 632 Ohm
MDC IDC MSMT LEADCHNL RV SENSING INTR AMPL: 10.3 mV
MDC IDC PG SERIAL: 266301
MDC IDC SET LEADCHNL RV PACING PULSEWIDTH: 0.4 ms
MDC IDC SET LEADCHNL RV SENSING SENSITIVITY: 0.4 mV
MDC IDC SET ZONE DETECTION INTERVAL: 353 ms
Zone Setting Detection Interval: 250 ms
Zone Setting Detection Interval: 286 ms

## 2014-05-24 NOTE — Progress Notes (Signed)
Remote ICD transmission.   

## 2014-06-03 ENCOUNTER — Other Ambulatory Visit: Payer: Self-pay | Admitting: Internal Medicine

## 2014-06-03 ENCOUNTER — Telehealth: Payer: Self-pay | Admitting: *Deleted

## 2014-06-03 NOTE — Telephone Encounter (Signed)
Refill request for flonase from [facility]. I didn't see where you had ever prescribed this. Ok to fill?

## 2014-06-04 MED ORDER — FLUTICASONE PROPIONATE 50 MCG/ACT NA SUSP: 2.0000 | Freq: Every day | NASAL | Status: DC | PRN

## 2014-06-04 NOTE — Telephone Encounter (Signed)
done

## 2014-06-04 NOTE — Telephone Encounter (Signed)
That is ok - please px refills for 6 mo

## 2014-06-08 ENCOUNTER — Encounter: Payer: Self-pay | Admitting: Cardiology

## 2014-06-16 ENCOUNTER — Telehealth: Payer: Self-pay | Admitting: Family Medicine

## 2014-06-16 DIAGNOSIS — R739 Hyperglycemia, unspecified: Secondary | ICD-10-CM

## 2014-06-16 DIAGNOSIS — E785 Hyperlipidemia, unspecified: Secondary | ICD-10-CM

## 2014-06-16 DIAGNOSIS — I10 Essential (primary) hypertension: Secondary | ICD-10-CM

## 2014-06-16 DIAGNOSIS — E559 Vitamin D deficiency, unspecified: Secondary | ICD-10-CM

## 2014-06-16 DIAGNOSIS — E039 Hypothyroidism, unspecified: Secondary | ICD-10-CM

## 2014-06-16 DIAGNOSIS — K760 Fatty (change of) liver, not elsewhere classified: Secondary | ICD-10-CM

## 2014-06-16 NOTE — Telephone Encounter (Signed)
Message copied by Abner Greenspan on Wed Jun 16, 2014 10:18 PM ------      Message from: Ellamae Sia      Created: Wed Jun 09, 2014  6:24 PM      Regarding: Lab orders for Friday, 10.30.15       Patient is scheduled for CPX labs, please order future labs, Thanks , Terri       ------

## 2014-06-18 ENCOUNTER — Other Ambulatory Visit (INDEPENDENT_AMBULATORY_CARE_PROVIDER_SITE_OTHER): Payer: Medicare Other

## 2014-06-18 DIAGNOSIS — E039 Hypothyroidism, unspecified: Secondary | ICD-10-CM

## 2014-06-18 DIAGNOSIS — E559 Vitamin D deficiency, unspecified: Secondary | ICD-10-CM

## 2014-06-18 DIAGNOSIS — I10 Essential (primary) hypertension: Secondary | ICD-10-CM

## 2014-06-18 DIAGNOSIS — Z0000 Encounter for general adult medical examination without abnormal findings: Secondary | ICD-10-CM

## 2014-06-18 DIAGNOSIS — E785 Hyperlipidemia, unspecified: Secondary | ICD-10-CM

## 2014-06-18 DIAGNOSIS — K760 Fatty (change of) liver, not elsewhere classified: Secondary | ICD-10-CM

## 2014-06-18 DIAGNOSIS — R739 Hyperglycemia, unspecified: Secondary | ICD-10-CM

## 2014-06-18 LAB — COMPREHENSIVE METABOLIC PANEL
ALT: 25 U/L (ref 0–35)
AST: 29 U/L (ref 0–37)
Albumin: 3.5 g/dL (ref 3.5–5.2)
Alkaline Phosphatase: 109 U/L (ref 39–117)
BUN: 16 mg/dL (ref 6–23)
CO2: 23 meq/L (ref 19–32)
Calcium: 9.7 mg/dL (ref 8.4–10.5)
Chloride: 107 mEq/L (ref 96–112)
Creatinine, Ser: 0.9 mg/dL (ref 0.4–1.2)
GFR: 84.98 mL/min (ref >60.00)
Glucose, Bld: 98 mg/dL (ref 70–99)
Potassium: 4.2 mEq/L (ref 3.5–5.1)
SODIUM: 140 meq/L (ref 135–145)
TOTAL PROTEIN: 7.4 g/dL (ref 6.0–8.3)
Total Bilirubin: 0.5 mg/dL (ref 0.2–1.2)

## 2014-06-18 LAB — CBC WITH DIFFERENTIAL/PLATELET
BASOS ABS: 0 10*3/uL (ref 0.0–0.1)
Basophils Relative: 0.8 % (ref 0.0–3.0)
Eosinophils Absolute: 0.2 10*3/uL (ref 0.0–0.7)
Eosinophils Relative: 6.4 % — ABNORMAL HIGH (ref 0.0–5.0)
HEMATOCRIT: 39 % (ref 36.0–46.0)
Hemoglobin: 12.5 g/dL (ref 12.0–15.0)
LYMPHS ABS: 1.1 10*3/uL (ref 0.7–4.0)
Lymphocytes Relative: 31.7 % (ref 12.0–46.0)
MCHC: 32 g/dL (ref 30.0–36.0)
MCV: 88.6 fl (ref 78.0–100.0)
MONOS PCT: 10.1 % (ref 3.0–12.0)
Monocytes Absolute: 0.4 10*3/uL (ref 0.1–1.0)
NEUTROS PCT: 51 % (ref 43.0–77.0)
Neutro Abs: 1.8 10*3/uL (ref 1.4–7.7)
PLATELETS: 204 10*3/uL (ref 150.0–400.0)
RBC: 4.4 Mil/uL (ref 3.87–5.11)
RDW: 13 % (ref 11.5–15.5)
WBC: 3.6 10*3/uL — ABNORMAL LOW (ref 4.0–10.5)

## 2014-06-18 LAB — HEMOGLOBIN A1C: Hgb A1c MFr Bld: 5.5 % (ref 4.6–6.5)

## 2014-06-18 LAB — LIPID PANEL
CHOL/HDL RATIO: 3
Cholesterol: 156 mg/dL (ref 0–200)
HDL: 52.8 mg/dL (ref >39.00)
LDL Cholesterol: 90 mg/dL (ref 0–99)
NONHDL: 103.2
Triglycerides: 67 mg/dL (ref 0.0–149.0)
VLDL: 13.4 mg/dL (ref 0.0–40.0)

## 2014-06-18 LAB — TSH: TSH: 2.82 u[IU]/mL (ref 0.35–4.50)

## 2014-06-18 LAB — VITAMIN D 25 HYDROXY (VIT D DEFICIENCY, FRACTURES): VITD: 33.77 ng/mL (ref 30.00–100.00)

## 2014-06-23 ENCOUNTER — Encounter: Payer: Self-pay | Admitting: Cardiology

## 2014-06-25 ENCOUNTER — Ambulatory Visit (INDEPENDENT_AMBULATORY_CARE_PROVIDER_SITE_OTHER): Payer: Medicare Other | Admitting: Family Medicine

## 2014-06-25 ENCOUNTER — Encounter: Payer: Self-pay | Admitting: Family Medicine

## 2014-06-25 VITALS — BP 130/70 | HR 63 | Temp 98.0°F | Ht 65.5 in | Wt 204.2 lb

## 2014-06-25 DIAGNOSIS — R739 Hyperglycemia, unspecified: Secondary | ICD-10-CM

## 2014-06-25 DIAGNOSIS — Z95810 Presence of automatic (implantable) cardiac defibrillator: Secondary | ICD-10-CM

## 2014-06-25 DIAGNOSIS — E039 Hypothyroidism, unspecified: Secondary | ICD-10-CM

## 2014-06-25 DIAGNOSIS — E559 Vitamin D deficiency, unspecified: Secondary | ICD-10-CM

## 2014-06-25 DIAGNOSIS — I10 Essential (primary) hypertension: Secondary | ICD-10-CM

## 2014-06-25 DIAGNOSIS — Z23 Encounter for immunization: Secondary | ICD-10-CM

## 2014-06-25 DIAGNOSIS — E785 Hyperlipidemia, unspecified: Secondary | ICD-10-CM

## 2014-06-25 DIAGNOSIS — Z0000 Encounter for general adult medical examination without abnormal findings: Secondary | ICD-10-CM

## 2014-06-25 MED ORDER — SIMVASTATIN 40 MG PO TABS: ORAL_TABLET | ORAL | Status: DC

## 2014-06-25 NOTE — Patient Instructions (Signed)
Pneumonia vaccine today  Increase your vitamin D over the counter to 4000 iu daily for bone and overall health  Labs look pretty good  Try to get back on track with diet and exercise

## 2014-06-25 NOTE — Progress Notes (Signed)
Subjective:    Patient ID: [name], female    DOB: 1950/09/25, 63 y.o.   MRN: [id number]  HPI Here for annual medicare exam and also to review chronic and acute medical issues  I have personally reviewed the Medicare Annual Wellness questionnaire and have noted 1. The patient's medical and social history 2. Their use of alcohol, tobacco or illicit drugs 3. Their current medications and supplements 4. The patient's functional ability including ADL's, fall risks, home safety risks and hearing or visual             impairment. 5. Diet and physical activities 6. Evidence for depression or mood disorders  The patients weight, height, BMI have been recorded in the chart and visual acuity is per eye clinic.  I have made referrals, counseling and provided education to the patient based review of the above and I have provided the pt with a written personalized care plan for preventive services.  She is doing pretty well overall  Stress- living with a 39 year old   See scanned forms.  Routine anticipatory guidance given to patient.  See health maintenance. Colon cancer screening 4/14  Breast cancer screening mammogram 3/15 Self breast exam no breast lumps  Gyn -no problems  Flu vaccine 9/15  Tetanus vaccine 1/11 Pneumovax 9/09- needs that  Zoster vaccine2/13   Advance directive- she has one but it needs to be updated - given a handout  Cognitive function addressed- see scanned forms- and if abnormal then additional documentation follows. [place] daughter points out she occ forgets , - she has not become lost in familiar places/ no confusion , occ misplaces things  Mother had AlZ -so it always concerns her   Had dexa nl 6/13   PMH and SH reviewed  Meds, vitals, and allergies reviewed.   ROS: See HPI.  Otherwise negative.    Wt is up 10 lb with bmi of 33  Has junk food in the house with a teenager  Taking care of grand daughter-no time to exercise    bp is stable  today  No cp or palpitations or headaches or edema  No side effects to medicines  BP Readings from Last 3 Encounters:  06/25/14 130/70  02/18/14 134/82  12/28/13 110/72     Hypothyroidism  Pt has no clinical changes No change in energy level/ hair or skin/ edema and no tremor Lab Results  Component Value Date   TSH 2.82 06/18/2014     Hyperglycemia Lab Results  Component Value Date   HGBA1C 5.5 06/18/2014    Vitamin D level is 33.7 - she has been taking 2000 iu    takes aleve occ  Takes tylenol - careful not to overdose   Chemistry      Component Value Date/Time   NA 140 06/18/2014 0900   K 4.2 06/18/2014 0900   CL 107 06/18/2014 0900   CO2 23 06/18/2014 0900   BUN 16 06/18/2014 0900   CREATININE 0.9 06/18/2014 0900      Component Value Date/Time   CALCIUM 9.7 06/18/2014 0900   ALKPHOS 109 06/18/2014 0900   AST 29 06/18/2014 0900   ALT 25 06/18/2014 0900   BILITOT 0.5 06/18/2014 0900       Lab Results  Component Value Date   CHOL 156 06/18/2014   CHOL 148 06/18/2013   CHOL 164 01/03/2012   Lab Results  Component Value Date   HDL 52.80 06/18/2014   HDL 46.60 06/18/2013  HDL 60.60 01/03/2012   Lab Results  Component Value Date   LDLCALC 90 06/18/2014   LDLCALC 80 06/18/2013   LDLCALC 80 01/03/2012   Lab Results  Component Value Date   TRIG 67.0 06/18/2014   TRIG 106.0 06/18/2013   TRIG 115.0 01/03/2012   Lab Results  Component Value Date   CHOLHDL 3 06/18/2014   CHOLHDL 3 06/18/2013   CHOLHDL 3 01/03/2012   Lab Results  Component Value Date   LDLDIRECT 141.8 07/04/2007   overall stable with a good ratio She does avoid sat fats in diet    Patient Active Problem List   Diagnosis Date Noted  . Encounter for Medicare annual wellness exam 06/27/2014  . Hyperglycemia 06/23/2013  . Routine general medical examination at a health care facility 06/23/2013  . Tongue lesion 02/17/2013  . Dizziness and giddiness 01/16/2013  . Disturbance of  skin sensation 01/16/2013  . RLS (restless legs syndrome) 01/15/2013  . Hx of Clostridium difficile infection 10/10/2012  . Blood in stool 07/31/2012  . Bronchiectasis without acute exacerbation 04/29/2012  . Dyspnea on exertion 04/02/2012  . Non-ischemic cardiomyopathy 03/07/2012  . Colon cancer screening 01/11/2012  . Post-menopausal 01/11/2012  . Chest pain 11/07/2011  . Palpitations 04/17/2011  . Abnormal tympanic membrane 01/22/2011  . SLEEP APNEA, OBSTRUCTIVE 04/04/2010  . VENTRICULAR TACHYCARDIA 01/03/2009  . ICD  [employer]  Single chamber 01/03/2009  . PATENT FORAMEN OVALE 09/29/2008  . Vitamin D deficiency 08/17/2008  . Hypothyroidism 11/18/2006  . Hyperlipidemia 11/18/2006  . DEPRESSION 11/18/2006  . Essential hypertension 11/18/2006  . MITRAL VALVE PROLAPSE 11/18/2006  . CEREBRAL ANEURYSM 11/18/2006  . ALLERGIC RHINITIS 11/18/2006  . GERD 11/18/2006  . DIVERTICULOSIS, COLON 11/18/2006  . Fatty liver 11/18/2006  . FIBROMYALGIA 11/18/2006   Past Medical History  Diagnosis Date  . Automatic implantable cardiac defibrillator in situ   . Paroxysmal ventricular tachycardia   . Ostium secundum type atrial septal defect   . Unspecified transient cerebral ischemia   . Takotsubo syndrome   . Otalgia, unspecified   . Other malaise and fatigue   . Unspecified vitamin D deficiency   . Headache(784.0)   . ICD (implantable cardiac defibrillator), single, BSX   . Other pulmonary embolism and infarction   . Mitral valve disorders   . Other chronic nonalcoholic liver disease   . Myalgia and myositis, unspecified   . Cerebral aneurysm, nonruptured   . Hypothyroidism   . Unspecified essential hypertension   . Other and unspecified hyperlipidemia   . Esophageal reflux   . Diverticulosis of colon (without mention of hemorrhage)   . Depressive disorder, not elsewhere classified   . Allergic rhinitis, cause unspecified   . Enlargement of lymph nodes   . Obstructive  sleep apnea (adult) (pediatric)   . GI bleed   . PUD (peptic ulcer disease)   . Clostridium difficile infection    Past Surgical History  Procedure Laterality Date  . Partial hysterectomy      Fibroids  . Cerebral aneurysm repair  02/1999  . Knee arthroscopy      bilateral  . Cardiac defibrillator placement      Guidant  . Adenosine cardiolite  2003    neg, EF 50%  . Carotid dopplers  2006    neg  . Hospitalization  2004    GI bleed, PUD, diverticulosis (EGD,colonscopy)  . Hospitalization      PE, NSVT, s/p defib  . Abi  2006    normal  History  Substance Use Topics  . Smoking status: Former Smoker -- 1.00 packs/day for 15 years    Types: Cigarettes    Quit date: 08/20/1989  . Smokeless tobacco: Never Used     Comment: Started at 40; less than 1 PPD  . Alcohol Use: 0.0 oz/week    0 Not specified per week     Comment: rare   Family History  Problem Relation Age of Onset  . Diabetes Mother   . Alzheimer's disease Mother   . Diabetes Brother   . Cardiomyopathy      Family history  . Other Brother     Thyroid problem  . Allergies Sister   . Heart disease Sister   . Heart disease Brother   . Heart disease Father   . Colon cancer Maternal Grandmother   . Lung cancer Maternal Grandfather    Allergies  Allergen Reactions  . Ace Inhibitors     angioedema  . Amitriptyline Hcl     REACTION: ? reaction  . Atorvastatin     REACTION: severe muscle pain  . Cetirizine Hcl     REACTION: sleepy  . Codeine     REACTION: itching  . Paroxetine     REACTION: ? reaction  . Rosuvastatin     REACTION: severe muscle pain  . Carbamazepine Rash    REACTION: ? reaction  . Dilantin [Phenytoin Sodium Extended] Rash   Current Outpatient Prescriptions on File Prior to Visit  Medication Sig Dispense Refill  . acetaminophen (TYLENOL) 500 MG tablet Take 500 mg by mouth every 6 (six) hours as needed. For pain    . aspirin 81 MG tablet Take 81 mg by mouth daily.      .  carvedilol (COREG) 6.25 MG tablet Take 1 tablet (6.25 mg total) by mouth 2 (two) times daily with a meal. 180 tablet 2  . diclofenac sodium (VOLTAREN) 1 % GEL Apply 1 application topically daily as needed. For pain    . fluticasone (FLONASE) 50 MCG/ACT nasal spray Place 2 sprays into both nostrils daily as needed for allergies or rhinitis. 48 g 1  . hydrochlorothiazide (HYDRODIURIL) 25 MG tablet TAKE 1/2 TABLET (12.5MG )   DAILY 45 tablet 2  . latanoprost (XALATAN) 0.005 % ophthalmic solution Place 1 drop into both eyes at bedtime.    . pantoprazole (PROTONIX) 40 MG tablet TAKE 1 TABLET (40 MG TOTAL) BY MOUTH DAILY. 90 tablet 1  . polyethylene glycol powder (GLYCOLAX/MIRALAX) powder Take 17 g by mouth daily. 255 g 3  . potassium chloride (KLOR-CON M10) 10 MEQ tablet TAKE 1/2 (5 MEQ TOTAL)     TABLET DAILY 45 tablet 2  . SYNTHROID 50 MCG tablet TAKE 1 TABLET DAILY 90 tablet 1  . traMADol (ULTRAM) 50 MG tablet Take 50 mg by mouth every 8 (eight) hours as needed. For pain    . zolpidem (AMBIEN) 10 MG tablet TAKE 1 TABLET AT BEDTIME AS NEEDED FOR SLEEP 90 tablet 0  . [DISCONTINUED] potassium chloride (K-DUR) 10 MEQ tablet Take 0.5 tablets (5 mEq total) by mouth daily. 1/2 tab po qd 45 tablet 1   No current facility-administered medications on file prior to visit.    Review of Systems Review of Systems  Constitutional: Negative for fever, appetite change, fatigue and unexpected weight change.  Eyes: Negative for pain and visual disturbance.  Respiratory: Negative for cough and shortness of breath.   Cardiovascular: Negative for cp or palpitations    Gastrointestinal: Negative for  nausea, diarrhea and constipation.  Genitourinary: Negative for urgency and frequency.  Skin: Negative for pallor or rash   Neurological: Negative for weakness, light-headedness, numbness and headaches.  Hematological: Negative for adenopathy. Does not bruise/bleed easily.  Psychiatric/Behavioral: Negative for dysphoric  mood. The patient is not nervous/anxious.         Objective:   Physical Exam  Constitutional: She appears well-developed and well-nourished. No distress.  HENT:  Head: Normocephalic and atraumatic.  Right Ear: External ear normal.  Left Ear: External ear normal.  Nose: Nose normal.  Mouth/Throat: Oropharynx is clear and moist.  Eyes: Conjunctivae and EOM are normal. Pupils are equal, round, and reactive to light. Right eye exhibits no discharge. Left eye exhibits no discharge. No scleral icterus.  Neck: Normal range of motion. Neck supple. No JVD present. No thyromegaly present.  Cardiovascular: Normal rate, regular rhythm, normal heart sounds and intact distal pulses.  Exam reveals no gallop.   Pulmonary/Chest: Effort normal and breath sounds normal. No respiratory distress. She has no wheezes. She has no rales.  Abdominal: Soft. Bowel sounds are normal. She exhibits no distension and no mass. There is no tenderness.  Genitourinary: No breast swelling, tenderness, discharge or bleeding.  Breast exam: No mass, nodules, thickening, tenderness, bulging, retraction, inflamation, nipple discharge or skin changes noted.  No axillary or clavicular LA.      Musculoskeletal: She exhibits no edema or tenderness.  Lymphadenopathy:    She has no cervical adenopathy.  Neurological: She is alert. She has normal reflexes. No cranial nerve deficit. She exhibits normal muscle tone. Coordination normal.  Skin: Skin is warm and dry. No rash noted. No erythema. No pallor.  Psychiatric: She has a normal mood and affect.          Assessment & Plan:   Problem List Items Addressed This Visit      Cardiovascular and Mediastinum   Essential hypertension - Primary    bp in fair control at this time  BP Readings from Last 1 Encounters:  06/25/14 130/70   No changes needed Disc lifstyle change with low sodium diet and exercise  Labs reviewed      Relevant Medications      simvastatin (ZOCOR)  tablet   Non-ischemic cardiomyopathy   Relevant Medications      simvastatin (ZOCOR) tablet   VENTRICULAR TACHYCARDIA   Relevant Medications      simvastatin (ZOCOR) tablet     Endocrine   Hypothyroidism    Hypothyroidism  Pt has no clinical changes No change in energy level/ hair or skin/ edema and no tremor Lab Results  Component Value Date   TSH 2.82 06/18/2014          Other   Encounter for Medicare annual wellness exam    Reviewed health habits including diet and exercise and skin cancer prevention Reviewed appropriate screening tests for age  Also reviewed health mt list, fam hx and immunization status , as well as social and family history   See HPI Labs rev in detail  Pneumococcal vaccine today    Hyperglycemia    Lab Results  Component Value Date   HGBA1C 5.5 06/18/2014   This is stable  Disc need for low glycemic diet and wt loss to prevent DM    Hyperlipidemia   Relevant Medications      simvastatin (ZOCOR) tablet   Vitamin D deficiency    Vitamin D level is therapeutic with current supplementation Disc importance of this to bone and  overall health       Other Visit Diagnoses    Automatic implantable cardioverter-defibrillator in situ        Relevant Medications       simvastatin (ZOCOR) tablet    Need for 23-polyvalent pneumococcal polysaccharide vaccine        Relevant Orders       Pneumococcal polysaccharide vaccine 23-valent greater than or equal to 2yo subcutaneous/IM (Completed)

## 2014-06-25 NOTE — Progress Notes (Signed)
Pre visit review using our clinic review tool, if applicable. No additional management support is needed unless otherwise documented below in the visit note. 

## 2014-06-27 DIAGNOSIS — Z0000 Encounter for general adult medical examination without abnormal findings: Secondary | ICD-10-CM | POA: Insufficient documentation

## 2014-06-27 NOTE — Assessment & Plan Note (Signed)
bp in fair control at this time  BP Readings from Last 1 Encounters:  06/25/14 130/70   No changes needed Disc lifstyle change with low sodium diet and exercise  Labs reviewed

## 2014-06-27 NOTE — Assessment & Plan Note (Signed)
Lab Results  Component Value Date   HGBA1C 5.5 06/18/2014   This is stable  Disc need for low glycemic diet and wt loss to prevent DM

## 2014-06-27 NOTE — Assessment & Plan Note (Signed)
Vitamin D level is therapeutic with current supplementation Disc importance of this to bone and overall health  

## 2014-06-27 NOTE — Assessment & Plan Note (Signed)
Reviewed health habits including diet and exercise and skin cancer prevention Reviewed appropriate screening tests for age  Also reviewed health mt list, fam hx and immunization status , as well as social and family history   See HPI Labs rev in detail  Pneumococcal vaccine today

## 2014-06-27 NOTE — Assessment & Plan Note (Signed)
Hypothyroidism  Pt has no clinical changes No change in energy level/ hair or skin/ edema and no tremor Lab Results  Component Value Date   TSH 2.82 06/18/2014

## 2014-06-28 ENCOUNTER — Telehealth: Payer: Self-pay | Admitting: Family Medicine

## 2014-06-28 NOTE — Telephone Encounter (Signed)
emmi emailed °

## 2014-07-21 ENCOUNTER — Other Ambulatory Visit: Payer: Self-pay | Admitting: Family Medicine

## 2014-07-22 NOTE — Telephone Encounter (Signed)
Pt called for status of ambien refill; advised pt refill done today at 11:40. Pt will ck with pharmacy.

## 2014-07-22 NOTE — Telephone Encounter (Signed)
Px written for call in   

## 2014-07-22 NOTE — Telephone Encounter (Signed)
Rx called in as prescribed 

## 2014-07-22 NOTE — Telephone Encounter (Signed)
Electronic refill request, please advise  

## 2014-07-29 ENCOUNTER — Encounter (HOSPITAL_COMMUNITY): Payer: Self-pay | Admitting: Cardiovascular Disease

## 2014-08-20 DIAGNOSIS — C50919 Malignant neoplasm of unspecified site of unspecified female breast: Secondary | ICD-10-CM

## 2014-08-20 HISTORY — PX: BREAST LUMPECTOMY: SHX2

## 2014-08-20 HISTORY — DX: Malignant neoplasm of unspecified site of unspecified female breast: C50.919

## 2014-08-24 ENCOUNTER — Ambulatory Visit (INDEPENDENT_AMBULATORY_CARE_PROVIDER_SITE_OTHER): Payer: Medicare Other | Admitting: *Deleted

## 2014-08-24 DIAGNOSIS — I4729 Other ventricular tachycardia: Secondary | ICD-10-CM

## 2014-08-24 DIAGNOSIS — I472 Ventricular tachycardia: Secondary | ICD-10-CM

## 2014-08-24 NOTE — Progress Notes (Signed)
Remote ICD transmission.   

## 2014-08-26 ENCOUNTER — Other Ambulatory Visit: Payer: Self-pay | Admitting: Family Medicine

## 2014-08-27 ENCOUNTER — Other Ambulatory Visit: Payer: Self-pay | Admitting: *Deleted

## 2014-08-27 ENCOUNTER — Other Ambulatory Visit: Payer: Self-pay | Admitting: Internal Medicine

## 2014-08-27 MED ORDER — LEVOTHYROXINE SODIUM 50 MCG PO TABS: 50.0000 ug | ORAL_TABLET | Freq: Every day | ORAL | Status: DC

## 2014-09-01 LAB — MDC_IDC_ENUM_SESS_TYPE_REMOTE
Battery Remaining Longevity: 108 mo
Battery Remaining Percentage: 100 %
HighPow Impedance: 58 Ohm
Lead Channel Impedance Value: 638 Ohm
Lead Channel Pacing Threshold Amplitude: 0.8 V
Lead Channel Pacing Threshold Pulse Width: 0.4 ms
Lead Channel Setting Pacing Amplitude: 2.4 V
Lead Channel Setting Pacing Pulse Width: 0.4 ms
Lead Channel Setting Sensing Sensitivity: 0.4 mV
MDC IDC PG SERIAL: 266301
MDC IDC SESS DTM: 20160105071400
MDC IDC SET ZONE DETECTION INTERVAL: 286 ms
MDC IDC SET ZONE DETECTION INTERVAL: 353 ms
MDC IDC STAT BRADY RV PERCENT PACED: 0 %
Zone Setting Detection Interval: 250 ms

## 2014-09-27 ENCOUNTER — Encounter: Payer: Self-pay | Admitting: Physician Assistant

## 2014-09-27 ENCOUNTER — Telehealth: Payer: Self-pay

## 2014-09-27 ENCOUNTER — Ambulatory Visit (INDEPENDENT_AMBULATORY_CARE_PROVIDER_SITE_OTHER): Payer: Medicare Other | Admitting: Physician Assistant

## 2014-09-27 ENCOUNTER — Other Ambulatory Visit: Payer: Self-pay

## 2014-09-27 ENCOUNTER — Encounter: Payer: Self-pay | Admitting: Cardiology

## 2014-09-27 VITALS — BP 100/62 | HR 62 | Ht 65.5 in | Wt 204.8 lb

## 2014-09-27 DIAGNOSIS — R002 Palpitations: Secondary | ICD-10-CM

## 2014-09-27 DIAGNOSIS — I472 Ventricular tachycardia, unspecified: Secondary | ICD-10-CM

## 2014-09-27 DIAGNOSIS — Z1231 Encounter for screening mammogram for malignant neoplasm of breast: Secondary | ICD-10-CM

## 2014-09-27 DIAGNOSIS — Z95810 Presence of automatic (implantable) cardiac defibrillator: Secondary | ICD-10-CM

## 2014-09-27 DIAGNOSIS — I428 Other cardiomyopathies: Secondary | ICD-10-CM

## 2014-09-27 DIAGNOSIS — Z888 Allergy status to other drugs, medicaments and biological substances status: Secondary | ICD-10-CM

## 2014-09-27 DIAGNOSIS — R42 Dizziness and giddiness: Secondary | ICD-10-CM

## 2014-09-27 DIAGNOSIS — I429 Cardiomyopathy, unspecified: Secondary | ICD-10-CM

## 2014-09-27 DIAGNOSIS — I10 Essential (primary) hypertension: Secondary | ICD-10-CM

## 2014-09-27 DIAGNOSIS — I4729 Other ventricular tachycardia: Secondary | ICD-10-CM

## 2014-09-27 LAB — BASIC METABOLIC PANEL
BUN: 10 mg/dL (ref 6–23)
CALCIUM: 10.2 mg/dL (ref 8.4–10.5)
CHLORIDE: 101 meq/L (ref 96–112)
CO2: 30 mEq/L (ref 19–32)
Creatinine, Ser: 0.75 mg/dL (ref 0.40–1.20)
GFR: 99.43 mL/min (ref >60.00)
Glucose, Bld: 101 mg/dL — ABNORMAL HIGH (ref 70–99)
Potassium: 4 mEq/L (ref 3.5–5.1)
Sodium: 136 mEq/L (ref 135–145)

## 2014-09-27 MED ORDER — CARVEDILOL 12.5 MG PO TABS: 12.5000 mg | ORAL_TABLET | Freq: Two times a day (BID) | ORAL | Status: DC

## 2014-09-27 NOTE — Assessment & Plan Note (Signed)
Patient's blood pressure was elevated at home and she found some ramipril which she began taking. Unfortunately she has a documented allergy to this and her tongue is begun to swell. She is under a great deal of stress taking care of her autistic granddaughter. Recommend stopping ramipril and increase carvedilol to 12.5 mg twice a day. 2 g sodium diet. Follow-up with Dr. Caryl Comes in 2 months

## 2014-09-27 NOTE — Assessment & Plan Note (Addendum)
Patient's had an increase in palpitations associated with her elevated blood pressure. We'll check lab work today. Increase metoprolol. EKG without acute change. ICD was checked today and she has had no episodes other than 3 beat run of V. tach on 09/12/14.

## 2014-09-27 NOTE — Assessment & Plan Note (Signed)
Patient has true allergy to ACE Inhibitors. She forgot about this and began taking ramipril she found at home because her BP was elevated. She has a small ulcer on the side of her tongue. I advised her stop this.

## 2014-09-27 NOTE — Telephone Encounter (Signed)
-----   Message from Imogene Burn, PA-C sent at 09/27/2014  3:01 PM EST ----- Labs normal

## 2014-09-27 NOTE — Patient Instructions (Addendum)
Your physician has recommended you make the following change in your medication:  1) STOP Ramipril 2) INCREASE Carvedilol to 12.5mg  Twice Daily. An Rx has been sent to your pharmacy  Lab Today: B[facility]  Your physician recommends that you schedule a follow-up appointment in: 1-2 months with Dr.Klein    Low-Sodium Eating Plan Sodium raises blood pressure and causes water to be held in the body. Getting less sodium from food will help lower your blood pressure, reduce any swelling, and protect your heart, liver, and kidneys. We get sodium by adding salt (sodium chloride) to food. Most of our sodium comes from canned, boxed, and frozen foods. Restaurant foods, fast foods, and pizza are also very high in sodium. Even if you take medicine to lower your blood pressure or to reduce fluid in your body, getting less sodium from your food is important. WHAT IS MY PLAN? Most people should limit their sodium intake to 2,300 mg a day. Your health care provider recommends that you limit your sodium intake to __________ a day.  WHAT DO I NEED TO KNOW ABOUT THIS EATING PLAN? For the low-sodium eating plan, you will follow these general guidelines:  Choose foods with a % Daily Value for sodium of less than 5% (as listed on the food label).   Use salt-free seasonings or herbs instead of table salt or sea salt.   Check with your health care provider or pharmacist before using salt substitutes.   Eat fresh foods.  Eat more vegetables and fruits.  Limit canned vegetables. If you do use them, rinse them well to decrease the sodium.   Limit cheese to 1 oz (28 g) per day.   Eat lower-sodium products, often labeled as "lower sodium" or "no salt added."  Avoid foods that contain monosodium glutamate (MSG). MSG is sometimes added to [place] food and some canned foods.  Check food labels (Nutrition Facts labels) on foods to learn how much sodium is in one serving.  Eat more home-cooked food and less  restaurant, buffet, and fast food.  When eating at a restaurant, ask that your food be prepared with less salt or none, if possible.  HOW DO I READ FOOD LABELS FOR SODIUM INFORMATION? The Nutrition Facts label lists the amount of sodium in one serving of the food. If you eat more than one serving, you must multiply the listed amount of sodium by the number of servings. Food labels may also identify foods as:  Sodium free--Less than 5 mg in a serving.  Very low sodium--35 mg or less in a serving.  Low sodium--140 mg or less in a serving.  Light in sodium--50% less sodium in a serving. For example, if a food that usually has 300 mg of sodium is changed to become light in sodium, it will have 150 mg of sodium.  Reduced sodium--25% less sodium in a serving. For example, if a food that usually has 400 mg of sodium is changed to reduced sodium, it will have 300 mg of sodium. WHAT FOODS CAN I EAT? Grains Low-sodium cereals, including oats, puffed wheat and rice, and shredded wheat cereals. Low-sodium crackers. Unsalted rice and pasta. Lower-sodium bread.  Vegetables Frozen or fresh vegetables. Low-sodium or reduced-sodium canned vegetables. Low-sodium or reduced-sodium tomato sauce and paste. Low-sodium or reduced-sodium tomato and vegetable juices.  Fruits Fresh, frozen, and canned fruit. Fruit juice.  Meat and Other Protein Products Low-sodium canned tuna and salmon. Fresh or frozen meat, poultry, seafood, and fish. Lamb. Unsalted nuts. Dried  beans, peas, and lentils without added salt. Unsalted canned beans. Homemade soups without salt. Eggs.  Dairy Milk. Soy milk. Ricotta cheese. Low-sodium or reduced-sodium cheeses. Yogurt.  Condiments Fresh and dried herbs and spices. Salt-free seasonings. Onion and garlic powders. Low-sodium varieties of mustard and ketchup. Lemon juice.  Fats and Oils Reduced-sodium salad dressings. Unsalted butter.  Other Unsalted popcorn and pretzels.   The items listed above may not be a complete list of recommended foods or beverages. Contact your dietitian for more options. WHAT FOODS ARE NOT RECOMMENDED? Grains Instant hot cereals. Bread stuffing, pancake, and biscuit mixes. Croutons. Seasoned rice or pasta mixes. Noodle soup cups. Boxed or frozen macaroni and cheese. Self-rising flour. Regular salted crackers. Vegetables Regular canned vegetables. Regular canned tomato sauce and paste. Regular tomato and vegetable juices. Frozen vegetables in sauces. Salted french fries. Olives. Angie Fava. Relishes. Sauerkraut. Salsa. Meat and Other Protein Products Salted, canned, smoked, spiced, or pickled meats, seafood, or fish. Bacon, ham, sausage, hot dogs, corned beef, chipped beef, and packaged luncheon meats. Salt pork. Jerky. Pickled herring. Anchovies, regular canned tuna, and sardines. Salted nuts. Dairy Processed cheese and cheese spreads. Cheese curds. Blue cheese and cottage cheese. Buttermilk.  Condiments Onion and garlic salt, seasoned salt, table salt, and sea salt. Canned and packaged gravies. Worcestershire sauce. Tartar sauce. Barbecue sauce. Teriyaki sauce. Soy sauce, including reduced sodium. Steak sauce. Fish sauce. Oyster sauce. Cocktail sauce. Horseradish. Regular ketchup and mustard. Meat flavorings and tenderizers. Bouillon cubes. Hot sauce. Tabasco sauce. Marinades. Taco seasonings. Relishes. Fats and Oils Regular salad dressings. Salted butter. Margarine. Ghee. Bacon fat.  Other Potato and tortilla chips. Corn chips and puffs. Salted popcorn and pretzels. Canned or dried soups. Pizza. Frozen entrees and pot pies.  The items listed above may not be a complete list of foods and beverages to avoid. Contact your dietitian for more information. Document Released: 01/26/2002 Document Revised: 08/11/2013 Document Reviewed: 06/10/2013 [facility] Patient Information 2015 [place], [place]. This information is not intended to replace  advice given to you by your health care provider. Make sure you discuss any questions you have with your health care provider.

## 2014-09-27 NOTE — Telephone Encounter (Signed)
called tot give pt lab results

## 2014-09-27 NOTE — Progress Notes (Signed)
Cardiology Office Note   Date:  09/27/2014   ID:  Adriana, [name] [birth year], MRN [id number]  PCP:  Loura Pardon, MD  Cardiologist: Dr. Jolyn Nap  CC: elevated BP and palpitations.    History of Present Illness: [name] is a 64 y.o. female who presents for followup for ICD implanted for secondary prevention with hx ventricular tachycardia in the context of sarcoid and nonobstructive coronary disease and Cardiomyopathy.   She has some exercise fatigue with mild dyspnea; she has significant snoring and has a history of an abnormal sleep study.  2013 echocardiogram demonstrated moderate left ventricular dysfunction with EF of 35-40% new from September 2012;   LHC 7/13 no significant coronary disease and an LVEF of 45% . She has HTN, ACE Inhibitor allergy(positive tongue biopsy), chronic CHF. Last saw Dr. Caryl Comes 02/2014.  Patient comes in today complaining of headaches associated with elevated BP. She found some ramipril and began taking it. She forgot she was allergic and now her tongue has an ulcer and is slightly swollen. She also is having an increase in palpitations. Her 17 y o granddaughter moved in with her and is highly autistic, causing a great deal of stress. She tries to watch her salt intake.      Past Medical History  Diagnosis Date  . Automatic implantable cardiac defibrillator in situ   . Paroxysmal ventricular tachycardia   . Ostium secundum type atrial septal defect   . Unspecified transient cerebral ischemia   . Takotsubo syndrome   . Otalgia, unspecified   . Other malaise and fatigue   . Unspecified vitamin D deficiency   . Headache(784.0)   . ICD (implantable cardiac defibrillator), single, BSX   . Other pulmonary embolism and infarction   . Mitral valve disorders   . Other chronic nonalcoholic liver disease   . Myalgia and myositis, unspecified   . Cerebral aneurysm, nonruptured   . Hypothyroidism   . Unspecified essential  hypertension   . Other and unspecified hyperlipidemia   . Esophageal reflux   . Diverticulosis of colon (without mention of hemorrhage)   . Depressive disorder, not elsewhere classified   . Allergic rhinitis, cause unspecified   . Enlargement of lymph nodes   . Obstructive sleep apnea (adult) (pediatric)   . GI bleed   . PUD (peptic ulcer disease)   . Clostridium difficile infection     Past Surgical History  Procedure Laterality Date  . Partial hysterectomy      Fibroids  . Cerebral aneurysm repair  02/1999  . Knee arthroscopy      bilateral  . Cardiac defibrillator placement      Guidant  . Adenosine cardiolite  2003    neg, EF 50%  . Carotid dopplers  2006    neg  . Hospitalization  2004    GI bleed, PUD, diverticulosis (EGD,colonscopy)  . Hospitalization      PE, NSVT, s/p defib  . Abi  2006    normal  . Left heart catheterization with coronary angiogram N/A 02/22/2012    Procedure: LEFT HEART CATHETERIZATION WITH CORONARY ANGIOGRAM;  Surgeon: Burnell Blanks, MD;  Location: [facility] CATH LAB;  Service: Cardiovascular;  Laterality: N/A;     Current Outpatient Prescriptions  Medication Sig Dispense Refill  . acetaminophen (TYLENOL) 500 MG tablet Take 500 mg by mouth every 6 (six) hours as needed. For pain    . aspirin 81 MG tablet Take 81 mg by mouth daily.      [name]  carvedilol (COREG) 6.25 MG tablet Take 1 tablet (6.25 mg total) by mouth 2 (two) times daily with a meal. 180 tablet 2  . diclofenac sodium (VOLTAREN) 1 % GEL Apply 1 application topically daily as needed. For pain    . fluticasone (FLONASE) 50 MCG/ACT nasal spray Place 2 sprays into both nostrils daily as needed for allergies or rhinitis. 48 g 1  . hydrochlorothiazide (HYDRODIURIL) 25 MG tablet TAKE 1/2 TABLET (12.5 MG   TOTAL) DAILY 45 tablet 1  . latanoprost (XALATAN) 0.005 % ophthalmic solution Place 1 drop into both eyes at bedtime.    [name] levothyroxine (SYNTHROID) 50 MCG tablet Take 1 tablet (50 mcg total)  by mouth daily. 90 tablet 1  . pantoprazole (PROTONIX) 40 MG tablet TAKE 1 TABLET (40 MG TOTAL) BY MOUTH DAILY. 90 tablet 1  . polyethylene glycol powder (GLYCOLAX/MIRALAX) powder TAKE 17 G BY MOUTH DAILY. 255 g 3  . potassium chloride (KLOR-CON M10) 10 MEQ tablet TAKE 1/2 (5 MEQ TOTAL)     TABLET DAILY 45 tablet 2  . simvastatin (ZOCOR) 40 MG tablet TAKE 1/2 TABLET DAILY 45 tablet 3  . traMADol (ULTRAM) 50 MG tablet Take 50 mg by mouth every 8 (eight) hours as needed. For pain    . zolpidem (AMBIEN) 10 MG tablet TAKE 1 TABLET AT BEDTIME AS NEEDED FOR SLEEP 90 tablet 1  . [DISCONTINUED] potassium chloride (K-DUR) 10 MEQ tablet Take 0.5 tablets (5 mEq total) by mouth daily. 1/2 tab po qd 45 tablet 1   No current facility-administered medications for this visit.    Allergies:   Ace inhibitors; Amitriptyline hcl; Atorvastatin; Cetirizine hcl; Codeine; Paroxetine; Rosuvastatin; Carbamazepine; and Dilantin    Social History:  The patient  reports that she quit smoking about 25 years ago. Her smoking use included Cigarettes. She has a 15 pack-year smoking history. She has never used smokeless tobacco. She reports that she drinks alcohol. She reports that she does not use illicit drugs.   Family History:  The patient's family history includes Allergies in her sister; Alzheimer's disease in her mother; Cardiomyopathy in an other family member; Colon cancer in her maternal grandmother; Diabetes in her brother and mother; Heart disease in her brother, father, and sister; Lung cancer in her maternal grandfather; Other in her brother.    ROS:  Please see the history of present illness.   Otherwise, review of systems are positive for constipation, back pain, joint swelling, snoring..   All other systems are reviewed and negative.    PHYSICAL EXAM: BP 100/62 mmHg  Pulse 62  Ht 5' 5.5" (1.664 m)  Wt 204 lb 12.8 oz (92.897 kg)  BMI 33.55 kg/m2 GEN: Well nourished, well developed, in no acute  distress HEENT: normal except for small ulcer right side of tongue. Neck: no JVD, carotid bruits, or masses Cardiac: RRR; no murmurs, rubs, or gallops,no edema  Respiratory:  clear to auscultation bilaterally, normal work of breathing GI: soft, nontender, nondistended, + BS MS: no deformity or atrophy Skin: warm and dry, no rash Neuro:  Strength and sensation are intact Psych: euthymic mood, full affect   EKG:  EKG is ordered today. The ekg ordered today demonstrates normal sinus rhythm at 60 bpm, no acute change   Recent Labs: 06/18/2014: ALT 25; BUN 16; Creatinine 0.9; Hemoglobin 12.5; Platelets 204.0; Potassium 4.2; Sodium 140; TSH 2.82    Lipid Panel    Component Value Date/Time   CHOL 156 06/18/2014 0900   TRIG 67.0 06/18/2014  0900   HDL 52.80 06/18/2014 0900   CHOLHDL 3 06/18/2014 0900   VLDL 13.4 06/18/2014 0900   LDLCALC 90 06/18/2014 0900   LDLDIRECT 141.8 07/04/2007 0936      Wt Readings from Last 3 Encounters:  06/25/14 204 lb 4 oz (92.647 kg)  02/18/14 194 lb (87.998 kg)  12/28/13 204 lb 6.4 oz (92.715 kg)      Other studies Reviewed: Additional studies/ records that were reviewed today include: 2-D echo 01/2012: Study Conclusions  - Left ventricle: The cavity size was normal. Wall thickness   was normal. Systolic function was moderately reduced. The   estimated ejection fraction was in the range of 35% to   40%. Diffuse hypokinesis. Doppler parameters are   consistent with abnormal left ventricular relaxation   (grade 1 diastolic dysfunction). - Mitral valve: Mild regurgitation. - Pulmonary arteries: Systolic pressure was mildly   increased. PA peak pressure: 35mm Hg (S).    Current medicines are reviewed at length with the patient today.  The patient was told to get rid of the ramipril and not take it again.   Sumner Boast, PA-C  09/27/2014 10:41 AM    [facility] [address], [place], [place]  [address] Phone:  [phone]; Fax: [phone]

## 2014-09-29 ENCOUNTER — Encounter: Payer: Self-pay | Admitting: Internal Medicine

## 2014-10-11 ENCOUNTER — Encounter: Payer: Self-pay | Admitting: Cardiology

## 2014-10-19 ENCOUNTER — Encounter: Payer: Self-pay | Admitting: Family Medicine

## 2014-10-19 ENCOUNTER — Ambulatory Visit (INDEPENDENT_AMBULATORY_CARE_PROVIDER_SITE_OTHER): Payer: Medicare Other | Admitting: Family Medicine

## 2014-10-19 VITALS — BP 122/66 | HR 63 | Temp 97.5°F | Ht 65.5 in | Wt 205.1 lb

## 2014-10-19 DIAGNOSIS — A5901 Trichomonal vulvovaginitis: Secondary | ICD-10-CM | POA: Insufficient documentation

## 2014-10-19 DIAGNOSIS — N898 Other specified noninflammatory disorders of vagina: Secondary | ICD-10-CM | POA: Insufficient documentation

## 2014-10-19 LAB — POCT WET PREP (WET MOUNT): Trichomonas Wet Prep HPF POC: POSITIVE

## 2014-10-19 MED ORDER — METRONIDAZOLE 500 MG PO TABS: 500.0000 mg | ORAL_TABLET | Freq: Two times a day (BID) | ORAL | Status: DC

## 2014-10-19 NOTE — Patient Instructions (Signed)
Take flagyl twice daily for 7 days for vaginal infection  If not improved after that please let me know  Get your partner checked out  I also sent out a test to screen for gonorrhea and chlamydia

## 2014-10-19 NOTE — Progress Notes (Signed)
Subjective:    Patient ID: [name], female    DOB: 1951/06/23, 64 y.o.   MRN: [id number]  HPI Here with symptoms of vaginitis 2 weeks  Vaginal discharge - clear to yellow  Some itchy  No burning unless she wipes Also odor  No new sexual partners - is interested in swab for gc/chlam  A few weeks ago she had some pain in low abd when trying to pass stools - but that got better    Bought otc suppos for yeast and it did not help - (more than a little bit)   Patient Active Problem List   Diagnosis Date Noted  . Vaginal discharge 10/19/2014  . Trichomonal vaginitis 10/19/2014  . Allergy to ACE inhibitors 09/27/2014  . Encounter for Medicare annual wellness exam 06/27/2014  . Hyperglycemia 06/23/2013  . Routine general medical examination at a health care facility 06/23/2013  . Tongue lesion 02/17/2013  . Dizziness and giddiness 01/16/2013  . Disturbance of skin sensation 01/16/2013  . RLS (restless legs syndrome) 01/15/2013  . Hx of Clostridium difficile infection 10/10/2012  . Blood in stool 07/31/2012  . Bronchiectasis without acute exacerbation 04/29/2012  . Dyspnea on exertion 04/02/2012  . Non-ischemic cardiomyopathy 03/07/2012  . Colon cancer screening 01/11/2012  . Post-menopausal 01/11/2012  . Chest pain 11/07/2011  . Palpitations 04/17/2011  . Abnormal tympanic membrane 01/22/2011  . SLEEP APNEA, OBSTRUCTIVE 04/04/2010  . VENTRICULAR TACHYCARDIA 01/03/2009  . ICD  [employer]  Single chamber 01/03/2009  . PATENT FORAMEN OVALE 09/29/2008  . Vitamin D deficiency 08/17/2008  . Hypothyroidism 11/18/2006  . Hyperlipidemia 11/18/2006  . DEPRESSION 11/18/2006  . Essential hypertension 11/18/2006  . MITRAL VALVE PROLAPSE 11/18/2006  . CEREBRAL ANEURYSM 11/18/2006  . ALLERGIC RHINITIS 11/18/2006  . GERD 11/18/2006  . DIVERTICULOSIS, COLON 11/18/2006  . Fatty liver 11/18/2006  . FIBROMYALGIA 11/18/2006   Past Medical History  Diagnosis Date  .  Automatic implantable cardiac defibrillator in situ   . Paroxysmal ventricular tachycardia   . Ostium secundum type atrial septal defect   . Unspecified transient cerebral ischemia   . Takotsubo syndrome   . Otalgia, unspecified   . Other malaise and fatigue   . Unspecified vitamin D deficiency   . Headache(784.0)   . ICD (implantable cardiac defibrillator), single, BSX   . Other pulmonary embolism and infarction   . Mitral valve disorders   . Other chronic nonalcoholic liver disease   . Myalgia and myositis, unspecified   . Cerebral aneurysm, nonruptured   . Hypothyroidism   . Unspecified essential hypertension   . Other and unspecified hyperlipidemia   . Esophageal reflux   . Diverticulosis of colon (without mention of hemorrhage)   . Depressive disorder, not elsewhere classified   . Allergic rhinitis, cause unspecified   . Enlargement of lymph nodes   . Obstructive sleep apnea (adult) (pediatric)   . GI bleed   . PUD (peptic ulcer disease)   . Clostridium difficile infection    Past Surgical History  Procedure Laterality Date  . Partial hysterectomy      Fibroids  . Cerebral aneurysm repair  02/1999  . Knee arthroscopy      bilateral  . Cardiac defibrillator placement      Guidant  . Adenosine cardiolite  2003    neg, EF 50%  . Carotid dopplers  2006    neg  . Hospitalization  2004    GI bleed, PUD, diverticulosis (EGD,colonscopy)  . Hospitalization  PE, NSVT, s/p defib  . Abi  2006    normal  . Left heart catheterization with coronary angiogram N/A 02/22/2012    Procedure: LEFT HEART CATHETERIZATION WITH CORONARY ANGIOGRAM;  Surgeon: Burnell Blanks, MD;  Location: [facility] CATH LAB;  Service: Cardiovascular;  Laterality: N/A;   History  Substance Use Topics  . Smoking status: Former Smoker -- 1.00 packs/day for 15 years    Types: Cigarettes    Quit date: 08/20/1989  . Smokeless tobacco: Never Used     Comment: Started at 89; less than 1 PPD  . Alcohol  Use: 0.0 oz/week    0 Standard drinks or equivalent per week     Comment: rare   Family History  Problem Relation Age of Onset  . Diabetes Mother   . Alzheimer's disease Mother   . Diabetes Brother   . Cardiomyopathy      Family history  . Other Brother     Thyroid problem  . Allergies Sister   . Heart disease Sister   . Heart disease Brother   . Heart disease Father   . Colon cancer Maternal Grandmother   . Lung cancer Maternal Grandfather    Allergies  Allergen Reactions  . Ace Inhibitors     angioedema  . Amitriptyline Hcl     REACTION: ? reaction  . Atorvastatin     REACTION: severe muscle pain  . Cetirizine Hcl     REACTION: sleepy  . Codeine     REACTION: itching  . Paroxetine     REACTION: ? reaction  . Rosuvastatin     REACTION: severe muscle pain  . Carbamazepine Rash    REACTION: ? reaction  . Dilantin [Phenytoin Sodium Extended] Rash   Current Outpatient Prescriptions on File Prior to Visit  Medication Sig Dispense Refill  . acetaminophen (TYLENOL) 500 MG tablet Take 500 mg by mouth every 6 (six) hours as needed. For pain    . aspirin 81 MG tablet Take 81 mg by mouth daily.      . carvedilol (COREG) 12.5 MG tablet Take 1 tablet (12.5 mg total) by mouth 2 (two) times daily with a meal. 60 tablet 5  . clobetasol cream (TEMOVATE) 0.05 % Apply topically 2 (two) times daily. For dermatitis  2  . fluticasone (CUTIVATE) 0.05 % cream Apply topically 2 (two) times daily. Prn for face  2  . fluticasone (FLONASE) 50 MCG/ACT nasal spray Place 2 sprays into both nostrils daily as needed for allergies or rhinitis. 48 g 1  . hydrochlorothiazide (HYDRODIURIL) 25 MG tablet TAKE 1/2 TABLET (12.5 MG   TOTAL) DAILY (Patient taking differently: TAKE 1 TABLET (25  MG TOTAL) DAILY) 45 tablet 1  . latanoprost (XALATAN) 0.005 % ophthalmic solution Place 1 drop into both eyes at bedtime.    [name] levothyroxine (SYNTHROID) 50 MCG tablet Take 1 tablet (50 mcg total) by mouth daily. 90  tablet 1  . pantoprazole (PROTONIX) 40 MG tablet TAKE 1 TABLET (40 MG TOTAL) BY MOUTH DAILY. 90 tablet 1  . polyethylene glycol powder (GLYCOLAX/MIRALAX) powder TAKE 17 G BY MOUTH DAILY. 255 g 3  . potassium chloride (KLOR-CON M10) 10 MEQ tablet TAKE 1/2 (5 MEQ TOTAL)     TABLET DAILY 45 tablet 2  . simvastatin (ZOCOR) 40 MG tablet TAKE 1/2 TABLET DAILY 45 tablet 3  . traMADol (ULTRAM) 50 MG tablet Take 50 mg by mouth every 8 (eight) hours as needed. For pain    . zolpidem (  AMBIEN) 10 MG tablet TAKE 1 TABLET AT BEDTIME AS NEEDED FOR SLEEP 90 tablet 1  . [DISCONTINUED] potassium chloride (K-DUR) 10 MEQ tablet Take 0.5 tablets (5 mEq total) by mouth daily. 1/2 tab po qd 45 tablet 1   No current facility-administered medications on file prior to visit.    Review of Systems Review of Systems  Constitutional: Negative for fever, appetite change, fatigue and unexpected weight change.  Eyes: Negative for pain and visual disturbance.  Respiratory: Negative for cough and shortness of breath.   Cardiovascular: Negative for cp or palpitations    Gastrointestinal: Negative for nausea, diarrhea and constipation.  Genitourinary: Negative for urgency and frequency. pos for vaginal d/c , neg for genital lesions  Skin: Negative for pallor or rash   Neurological: Negative for weakness, light-headedness, numbness and headaches.  Hematological: Negative for adenopathy. Does not bruise/bleed easily.  Psychiatric/Behavioral: Negative for dysphoric mood. The patient is not nervous/anxious.         Objective:   Physical Exam  Constitutional: She appears well-developed and well-nourished. No distress.  obese and well appearing   HENT:  Head: Normocephalic.  Mouth/Throat: Oropharynx is clear and moist.  Eyes: Conjunctivae and EOM are normal. Pupils are equal, round, and reactive to light. Right eye exhibits no discharge. Left eye exhibits no discharge.  Neck: Normal range of motion. Neck supple.    Cardiovascular: Normal rate and regular rhythm.   Genitourinary:  Thin yellow to grey vaginal d/c with odor  No vaginal lesions No suprapubic pain  No CMT   Lymphadenopathy:    She has no cervical adenopathy.  Skin: Skin is warm and dry. No pallor.  Psychiatric: She has a normal mood and affect.          Assessment & Plan:   Problem List Items Addressed This Visit      Genitourinary   Trichomonal vaginitis    With discharge and discomfort tx with flagyl bid for 7 d  Enc her to have her partner tested and treated  Also sent test for gc and chlamydia   Disc imp of condom use      Relevant Medications   metroNIDAZOLE (FLAGYL) tablet   Other Relevant Orders   POCT Wet Prep [facility]) (Completed)     Other   Vaginal discharge - Primary    Trichomonas on wet prep -treat with flagyl Handout given       Relevant Orders   POCT Wet Prep [facility]) (Completed)

## 2014-10-19 NOTE — Progress Notes (Signed)
Pre visit review using our clinic review tool, if applicable. No additional management support is needed unless otherwise documented below in the visit note. 

## 2014-10-21 NOTE — Assessment & Plan Note (Signed)
With discharge and discomfort tx with flagyl bid for 7 d  Enc her to have her partner tested and treated  Also sent test for gc and chlamydia   Disc imp of condom use

## 2014-10-21 NOTE — Assessment & Plan Note (Signed)
Trichomonas on wet prep -treat with flagyl Handout given

## 2014-10-22 LAB — GC/CHLAMYDIA PROBE AMP
CT Probe RNA: NEGATIVE
GC PROBE AMP APTIMA: NEGATIVE

## 2014-10-29 ENCOUNTER — Ambulatory Visit
Admission: RE | Admit: 2014-10-29 | Discharge: 2014-10-29 | Disposition: A | Discharge status: Home / Self Care | Payer: Medicare Other | Source: Ambulatory Visit

## 2014-10-29 DIAGNOSIS — Z1231 Encounter for screening mammogram for malignant neoplasm of breast: Secondary | ICD-10-CM

## 2014-11-02 ENCOUNTER — Other Ambulatory Visit: Payer: Self-pay | Admitting: Family Medicine

## 2014-11-02 ENCOUNTER — Telehealth: Payer: Self-pay

## 2014-11-02 DIAGNOSIS — R928 Other abnormal and inconclusive findings on diagnostic imaging of breast: Secondary | ICD-10-CM

## 2014-11-02 NOTE — Telephone Encounter (Signed)
Informed patient of mammogram recommendations.  She will call to schedule a follow up.

## 2014-11-02 NOTE — Telephone Encounter (Signed)
-----   Message from Abner Greenspan, MD sent at 11/01/2014  9:17 PM EDT ----- Radiologist wants additional views of R breast  If she is not contacted to schedule this please update me

## 2014-11-05 ENCOUNTER — Other Ambulatory Visit: Payer: Self-pay | Admitting: Family Medicine

## 2014-11-05 ENCOUNTER — Ambulatory Visit
Admission: RE | Admit: 2014-11-05 | Discharge: 2014-11-05 | Disposition: A | Discharge status: Home / Self Care | Payer: Medicare Other | Source: Ambulatory Visit | Attending: Family Medicine | Admitting: Family Medicine

## 2014-11-05 DIAGNOSIS — R928 Other abnormal and inconclusive findings on diagnostic imaging of breast: Secondary | ICD-10-CM

## 2014-11-10 ENCOUNTER — Encounter: Payer: Self-pay | Admitting: Pulmonary Disease

## 2014-11-10 ENCOUNTER — Ambulatory Visit (INDEPENDENT_AMBULATORY_CARE_PROVIDER_SITE_OTHER): Payer: Medicare Other | Admitting: Pulmonary Disease

## 2014-11-10 VITALS — BP 142/84 | HR 62 | Temp 97.0°F | Ht 66.5 in | Wt 202.4 lb

## 2014-11-10 DIAGNOSIS — G4733 Obstructive sleep apnea (adult) (pediatric): Secondary | ICD-10-CM | POA: Diagnosis not present

## 2014-11-10 DIAGNOSIS — G2581 Restless legs syndrome: Secondary | ICD-10-CM | POA: Diagnosis not present

## 2014-11-10 DIAGNOSIS — J479 Bronchiectasis, uncomplicated: Secondary | ICD-10-CM

## 2014-11-10 NOTE — Assessment & Plan Note (Signed)
The patient feels that her sleep is worse than it has been in the past, including increased daytime sleepiness. She would like to try C Pap for treatment of her sleep apnea to see if things improve. She understands that her limb movements may be much greater of an issue than her sleep apnea.

## 2014-11-10 NOTE — Patient Instructions (Signed)
Will start on cpap at a moderate pressure level.  Please call if having tolerance issues. Work on weight reduction Will see back in 8 weeks

## 2014-11-10 NOTE — Progress Notes (Signed)
   Subjective:    Patient ID: [name], female    DOB: 1950-11-29, 64 y.o.   MRN: [id number]  HPI Patient comes in today for follow-up of her known obstructive sleep apnea, restless leg syndrome, and also mild bronchiectasis with no evidence of airflow obstruction. She is stable from a breathing standpoint, and has had no significant cough or mucus production. She was given a prescription for a dopamine agonist at the last visit to treat her restless leg syndrome, but decided against taking the medication. She continues to have issues with sleep disruption, nonrestorative sleep, and some daytime sleepiness. She is interested in treating her very mild sleep apnea at this time.   Review of Systems  Constitutional: Negative for fever and unexpected weight change.  HENT: Positive for congestion and postnasal drip. Negative for dental problem, ear pain, nosebleeds, rhinorrhea, sinus pressure, sneezing, sore throat and trouble swallowing.   Eyes: Negative for redness and itching.  Respiratory: Positive for cough and shortness of breath. Negative for chest tightness and wheezing.   Cardiovascular: Negative for palpitations and leg swelling.  Gastrointestinal: Negative for nausea and vomiting.  Genitourinary: Negative for dysuria.  Musculoskeletal: Negative for joint swelling.  Skin: Negative for rash.  Neurological: Negative for headaches.  Hematological: Does not bruise/bleed easily.  Psychiatric/Behavioral: Negative for dysphoric mood. The patient is not nervous/anxious.        Objective:   Physical Exam Well-developed female in no acute distress Nose without purulence or discharge noted Neck without lymphadenopathy or thyromegaly Chest totally clear to auscultation, no wheezing Cardiac exam with controlled ventricular response Lower extremities without significant edema, no cyanosis Alert and oriented, moves all 4 extremities.       Assessment & Plan:

## 2014-11-10 NOTE — Assessment & Plan Note (Signed)
The patient has decided against taking a dopamine agonist for her restless leg syndrome, and understands this may be the primary disrupter to her sleep rather than her sleep apnea.

## 2014-11-10 NOTE — Assessment & Plan Note (Signed)
The patient has no significant pulmonary symptoms at this time.

## 2014-11-19 ENCOUNTER — Other Ambulatory Visit: Payer: Self-pay | Admitting: Family Medicine

## 2014-11-19 ENCOUNTER — Ambulatory Visit
Admission: RE | Admit: 2014-11-19 | Discharge: 2014-11-19 | Disposition: A | Discharge status: Home / Self Care | Payer: Medicare Other | Source: Ambulatory Visit | Attending: Family Medicine | Admitting: Family Medicine

## 2014-11-19 ENCOUNTER — Other Ambulatory Visit: Payer: Self-pay | Admitting: Internal Medicine

## 2014-11-19 DIAGNOSIS — R928 Other abnormal and inconclusive findings on diagnostic imaging of breast: Secondary | ICD-10-CM

## 2014-11-21 ENCOUNTER — Other Ambulatory Visit: Payer: Self-pay | Admitting: Internal Medicine

## 2014-11-22 ENCOUNTER — Other Ambulatory Visit: Payer: Self-pay

## 2014-11-22 DIAGNOSIS — Z95810 Presence of automatic (implantable) cardiac defibrillator: Secondary | ICD-10-CM

## 2014-11-22 DIAGNOSIS — I472 Ventricular tachycardia: Secondary | ICD-10-CM

## 2014-11-22 DIAGNOSIS — I428 Other cardiomyopathies: Secondary | ICD-10-CM

## 2014-11-22 DIAGNOSIS — I4729 Other ventricular tachycardia: Secondary | ICD-10-CM

## 2014-11-22 MED ORDER — LEVOTHYROXINE SODIUM 50 MCG PO TABS: 50.0000 ug | ORAL_TABLET | Freq: Every day | ORAL | Status: DC

## 2014-11-22 MED ORDER — POTASSIUM CHLORIDE CRYS ER 10 MEQ PO TBCR: EXTENDED_RELEASE_TABLET | ORAL | Status: DC

## 2014-11-22 NOTE — Telephone Encounter (Signed)
Patient needs written scripts for Klor-con and Synthroid. Call when ready for pickup. Patient would like to pickup scripts tomorrow.

## 2014-11-22 NOTE — Telephone Encounter (Signed)
Left message informing patient klorcon and synthroid rx ready for pick up.

## 2014-11-22 NOTE — Telephone Encounter (Signed)
Octavia Velador Self [phone]  Rx Plan changed to [facility] (Mail Order) [phone] Local RX is [facility] is requesting paper RX to send into [facility], she will pick up tomorrow. She stated this is new to her, so she is not sure what to do.  I did scan in a copy of her new [employer] RX card.

## 2014-11-22 NOTE — Telephone Encounter (Signed)
Px printed for mail or fax

## 2014-11-23 ENCOUNTER — Encounter: Payer: Self-pay | Admitting: *Deleted

## 2014-11-23 ENCOUNTER — Telehealth: Payer: Self-pay | Admitting: *Deleted

## 2014-11-23 DIAGNOSIS — Z853 Personal history of malignant neoplasm of breast: Secondary | ICD-10-CM | POA: Insufficient documentation

## 2014-11-23 DIAGNOSIS — C50411 Malignant neoplasm of upper-outer quadrant of right female breast: Secondary | ICD-10-CM

## 2014-11-23 NOTE — Telephone Encounter (Signed)
Confirmed [facility] for 12/01/14 at 0830 .  Instructions and contact information given.

## 2014-11-25 ENCOUNTER — Ambulatory Visit (INDEPENDENT_AMBULATORY_CARE_PROVIDER_SITE_OTHER): Payer: Medicare Other | Admitting: Internal Medicine

## 2014-11-25 ENCOUNTER — Encounter: Payer: Self-pay | Admitting: Internal Medicine

## 2014-11-25 VITALS — BP 130/78 | HR 68 | Ht 66.5 in | Wt 206.6 lb

## 2014-11-25 DIAGNOSIS — I472 Ventricular tachycardia: Secondary | ICD-10-CM | POA: Diagnosis not present

## 2014-11-25 DIAGNOSIS — I428 Other cardiomyopathies: Secondary | ICD-10-CM

## 2014-11-25 DIAGNOSIS — Z79899 Other long term (current) drug therapy: Secondary | ICD-10-CM

## 2014-11-25 DIAGNOSIS — I4729 Other ventricular tachycardia: Secondary | ICD-10-CM

## 2014-11-25 DIAGNOSIS — Z4502 Encounter for adjustment and management of automatic implantable cardiac defibrillator: Secondary | ICD-10-CM | POA: Diagnosis not present

## 2014-11-25 DIAGNOSIS — I429 Cardiomyopathy, unspecified: Secondary | ICD-10-CM

## 2014-11-25 LAB — MDC_IDC_ENUM_SESS_TYPE_INCLINIC
HighPow Impedance: 38 Ohm
HighPow Impedance: 59 Ohm
Implantable Pulse Generator Serial Number: 266301
Lead Channel Impedance Value: 640 Ohm
Lead Channel Pacing Threshold Amplitude: 0.7 V
Lead Channel Pacing Threshold Pulse Width: 0.4 ms
Lead Channel Sensing Intrinsic Amplitude: 10.9 mV
Lead Channel Setting Pacing Amplitude: 2.4 V
Lead Channel Setting Pacing Pulse Width: 0.4 ms
Lead Channel Setting Sensing Sensitivity: 0.4 mV
MDC IDC SESS DTM: 20160407040000
MDC IDC SET ZONE DETECTION INTERVAL: 286 ms
MDC IDC SET ZONE DETECTION INTERVAL: 353 ms
Zone Setting Detection Interval: 250 ms

## 2014-11-25 MED ORDER — HYDROCHLOROTHIAZIDE 25 MG PO TABS: 25.0000 mg | ORAL_TABLET | Freq: Every day | ORAL | Status: DC

## 2014-11-25 NOTE — Patient Instructions (Addendum)
Medication Instructions:  Your physician has recommended you make the following change in your medication:  1) INCREASE Hydrochlorothiazide - take 1 whole tablet daily  Labwork: Your physician recommends that you return for lab work in: 2 weeks for BMET & Magnesium  Testing/Procedures: Your physician has requested that you have an echocardiogram. Echocardiography is a painless test that uses sound waves to create images of your heart. It provides your doctor with information about the size and shape of your heart and how well your heart's chambers and valves are working. This procedure takes approximately one hour. There are no restrictions for this procedure.  Follow-Up: Your physician recommends that you schedule a follow-up appointment in: 4 weeks with Chanetta Marshall, NP.  Keep scheduled appointment with Dr. Caryl Comes on 02/22/15 at 1:45 pm   Any Other Special Instructions Will Be Listed Below (If Applicable).

## 2014-11-25 NOTE — Progress Notes (Signed)
Patient Care Team: Abner Greenspan, MD as PCP - [facility] [place] III, MD as Consulting Physician (General Surgery) Truitt Merle, MD as Consulting Physician (Hematology) Thea Silversmith, MD as Consulting Physician (Radiation Oncology) Rockwell Germany, RN as Registered Nurse Mauro Kaufmann, RN as Registered Nurse   HPI  [name] is a 64 y.o. 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 This may or may not correlate with dyspnea on exertion. She also has complaints of nocturnal palpitations. Holter monitor was reviewed from 2013 which demonstrated bursts of PVCs occurring not infrequently in the evening hours.  She was seen by Estella Husk a couple of months ago; her carvedilol was increased without appreciable change  She has some exercise fatigue with mild dyspnea; she has significant snoring and has a history of an abnormal sleep study  2013 echocardiogram demonstrated moderate left ventricular dysfunction with EF of 35-40% new from September 2012;  [facility] 7/13 no significant coronary disease and an LVEF of 45%    Past Medical History  Diagnosis Date  . Automatic implantable cardiac defibrillator in situ   . Paroxysmal ventricular tachycardia   . Ostium secundum type atrial septal defect   . Unspecified transient cerebral ischemia   . Takotsubo syndrome   . Otalgia, unspecified   . Other malaise and fatigue   . Unspecified vitamin D deficiency   . Headache(784.0)   . ICD (implantable cardiac defibrillator), single, BSX   . Other pulmonary embolism and infarction   . Mitral valve disorders   . Other chronic nonalcoholic liver disease   . Myalgia and myositis, unspecified   . Cerebral aneurysm, nonruptured   . Hypothyroidism   . Unspecified essential hypertension   . Other  and unspecified hyperlipidemia   . Esophageal reflux   . Diverticulosis of colon (without mention of hemorrhage)   . Depressive disorder, not elsewhere classified   . Allergic rhinitis, cause unspecified   . Enlargement of lymph nodes   . Obstructive sleep apnea (adult) (pediatric)   . GI bleed   . PUD (peptic ulcer disease)   . Clostridium difficile infection   . Breast cancer of upper-outer quadrant of right female breast 11/23/2014    Past Surgical History  Procedure Laterality Date  . Partial hysterectomy      Fibroids  . Cerebral aneurysm repair  02/1999  . Knee arthroscopy      bilateral  . Cardiac defibrillator placement      Guidant  . Adenosine cardiolite  2003    neg, EF 50%  . Carotid dopplers  2006    neg  . Hospitalization  2004    GI bleed, PUD, diverticulosis (EGD,colonscopy)  . Hospitalization      PE, NSVT, s/p defib  . Abi  2006    normal  . Left heart catheterization with coronary angiogram N/A 02/22/2012    Procedure: LEFT HEART CATHETERIZATION WITH CORONARY ANGIOGRAM;  Surgeon: Burnell Blanks, MD;  Location: [facility] CATH LAB;  Service: Cardiovascular;  Laterality: N/A;    Current Outpatient Prescriptions  Medication Sig Dispense Refill  . acetaminophen (TYLENOL) 500 MG tablet Take 500 mg by mouth every 6 (six) hours as needed. For pain    . aspirin 81 MG tablet Take 81 mg by mouth daily.      [name]  carvedilol (COREG) 12.5 MG tablet Take 1 tablet (12.5 mg total) by mouth 2 (two) times daily with a meal. 60 tablet 5  . clobetasol cream (TEMOVATE) 0.05 % Apply topically 2 (two) times daily. For dermatitis  2  . fexofenadine (ALLEGRA) 180 MG tablet Take 180 mg by mouth daily.    . fluticasone (CUTIVATE) 0.05 % cream Apply topically 2 (two) times daily. Prn for face  2  . fluticasone (FLONASE) 50 MCG/ACT nasal spray Place 2 sprays into both nostrils daily as needed for allergies or rhinitis. 48 g 1  . hydrochlorothiazide (HYDRODIURIL) 25 MG tablet TAKE 1/2  TABLET (12.5 MG   TOTAL) DAILY (Patient taking differently: TAKE 1 TABLET (25  MG TOTAL) DAILY) 45 tablet 1  . latanoprost (XALATAN) 0.005 % ophthalmic solution Place 1 drop into both eyes at bedtime.    [name] levothyroxine (SYNTHROID) 50 MCG tablet Take 1 tablet (50 mcg total) by mouth daily. 90 tablet 3  . pantoprazole (PROTONIX) 40 MG tablet TAKE 1 TABLET (40 MG TOTAL) BY MOUTH DAILY. (Patient taking differently: TAKE 1 TABLET (40 MG TOTAL) BY MOUTH DAILY) 90 tablet 1  . polyethylene glycol powder (GLYCOLAX/MIRALAX) powder TAKE 17 G BY MOUTH DAILY. 255 g 3  . potassium chloride (KLOR-CON M10) 10 MEQ tablet TAKE 1/2 (5 MEQ TOTAL)     TABLET DAILY 45 tablet 3  . simvastatin (ZOCOR) 40 MG tablet TAKE 1/2 TABLET DAILY 45 tablet 3  . traMADol (ULTRAM) 50 MG tablet Take 50 mg by mouth every 8 (eight) hours as needed. For pain    . zolpidem (AMBIEN) 10 MG tablet TAKE 1 TABLET AT BEDTIME AS NEEDED FOR SLEEP 90 tablet 1  . [DISCONTINUED] potassium chloride (K-DUR) 10 MEQ tablet Take 0.5 tablets (5 mEq total) by mouth daily. 1/2 tab po qd 45 tablet 1   No current facility-administered medications for this visit.    Allergies  Allergen Reactions  . Ace Inhibitors     angioedema  . Amitriptyline Hcl     REACTION: ? reaction  . Atorvastatin     REACTION: severe muscle pain  . Cetirizine Hcl     REACTION: sleepy  . Codeine     REACTION: itching  . Paroxetine     REACTION: ? reaction  . Rosuvastatin     REACTION: severe muscle pain  . Carbamazepine Rash    REACTION: ? reaction  . Dilantin [Phenytoin Sodium Extended] Rash    Review of Systems negative except from HPI and PMH  Physical Exam BP 130/78 mmHg  Pulse 68  Ht 5' 6.5" (1.689 m)  Wt 206 lb 9.6 oz (93.713 kg)  BMI 32.85 kg/m2 Well developed and well nourished in no acute distress HENT normal E scleral and icterus clear Neck Supple JVP flat; carotids brisk and full Clear to ausculation Device pocket well healed; without hematoma  or erythema.  There is no tethering  Regular rate and rhythm, no murmurs gallops or rub Soft with active bowel sounds No clubbing cyanosis tr Edema Alert and oriented, grossly normal motor and sensory function Skin Warm and Dry    Assessment and  Plan  Nonischemic Cardiomyopathy  CHF  Chronic mixed    Ventricular Tachycardia nointercurrent VT  ACE inhibitor allergy  Palpitations previously identified as PVCs  Hypertension  ICD [employer]  The patient's device was interrogated.  The information was reviewed. No changes were made in the programming.    She is mildly volume overloaded by her history and  examination. We'll try increasing her hydrochlorothiazide from half a tablet to a whole tablet a day.  We will check her potassium and magnesium levels and about 2 weeks time. In the event that she persists with volume overload, it may be appropriate to change her to a loop diuretic (see below)  Her palpitations are likely PVCs based on the Holter monitor from a few years ago. I've asked her to try to consider whether they are related  in time to volume overload as this might be in electromechanical consequence. They may also be more bothersome at night being recumbent. Her device unfortunately is not able to help [place] identify the mechanism.  We will check her left ventricular function. She is Ace intolerant. In the event that her LV function remained suppressed, I would initiate therapy with BiDil; her blood pressure certainly adequate for that at this juncture. Initially she wouldn't tolerate that subsequently would add aldosterone antagonist.  I will have her follow-up with A S in about 3-4 weeks' time. He can initiate BiDil at that time his LV function remains suppressed. We will also check a metabolic profile in the interim.  Blood pressure is modestly elevated; we will wait until we have the echo to address the next therapeutic choice.

## 2014-11-29 ENCOUNTER — Encounter: Payer: Self-pay | Admitting: Internal Medicine

## 2014-12-01 ENCOUNTER — Other Ambulatory Visit (HOSPITAL_BASED_OUTPATIENT_CLINIC_OR_DEPARTMENT_OTHER): Payer: Medicare Other

## 2014-12-01 ENCOUNTER — Ambulatory Visit
Admission: RE | Admit: 2014-12-01 | Discharge: 2014-12-01 | Disposition: A | Discharge status: Home / Self Care | Payer: Medicare Other | Source: Ambulatory Visit | Attending: Radiation Oncology | Admitting: Radiation Oncology

## 2014-12-01 ENCOUNTER — Ambulatory Visit: Payer: Medicare Other | Admitting: Physical Therapy

## 2014-12-01 ENCOUNTER — Ambulatory Visit (HOSPITAL_BASED_OUTPATIENT_CLINIC_OR_DEPARTMENT_OTHER): Payer: Medicare Other | Admitting: Hematology

## 2014-12-01 ENCOUNTER — Encounter: Payer: Self-pay | Admitting: Hematology

## 2014-12-01 ENCOUNTER — Encounter: Payer: Self-pay | Admitting: Skilled Nursing Facility1

## 2014-12-01 ENCOUNTER — Ambulatory Visit: Payer: Medicare Other

## 2014-12-01 VITALS — BP 113/61 | HR 71 | Temp 97.8°F | Resp 18 | Ht 66.5 in | Wt 204.8 lb

## 2014-12-01 DIAGNOSIS — D0511 Intraductal carcinoma in situ of right breast: Secondary | ICD-10-CM

## 2014-12-01 DIAGNOSIS — C50411 Malignant neoplasm of upper-outer quadrant of right female breast: Secondary | ICD-10-CM

## 2014-12-01 DIAGNOSIS — Z170 Estrogen receptor positive status [ER+]: Secondary | ICD-10-CM | POA: Diagnosis not present

## 2014-12-01 LAB — COMPREHENSIVE METABOLIC PANEL (CC13)
ALBUMIN: 3.9 g/dL (ref 3.5–5.0)
ALT: 33 U/L (ref 0–55)
ANION GAP: 11 meq/L (ref 3–11)
AST: 29 U/L (ref 5–34)
Alkaline Phosphatase: 127 U/L (ref 40–150)
BUN: 10.6 mg/dL (ref 7.0–26.0)
CHLORIDE: 104 meq/L (ref 98–109)
CO2: 26 mEq/L (ref 22–29)
Calcium: 9.8 mg/dL (ref 8.4–10.4)
Creatinine: 0.8 mg/dL (ref 0.6–1.1)
EGFR: 85 mL/min/{1.73_m2} — AB (ref >90)
Glucose: 175 mg/dl — ABNORMAL HIGH (ref 70–140)
POTASSIUM: 3.8 meq/L (ref 3.5–5.1)
Sodium: 141 mEq/L (ref 136–145)
Total Bilirubin: 0.42 mg/dL (ref 0.20–1.20)
Total Protein: 7.5 g/dL (ref 6.4–8.3)

## 2014-12-01 LAB — CBC WITH DIFFERENTIAL/PLATELET
BASO%: 0.9 % (ref 0.0–2.0)
BASOS ABS: 0 10*3/uL (ref 0.0–0.1)
EOS%: 6.8 % (ref 0.0–7.0)
Eosinophils Absolute: 0.3 10*3/uL (ref 0.0–0.5)
HCT: 38.8 % (ref 34.8–46.6)
HGB: 12.4 g/dL (ref 11.6–15.9)
LYMPH%: 25.9 % (ref 14.0–49.7)
MCH: 28.3 pg (ref 25.1–34.0)
MCHC: 32 g/dL (ref 31.5–36.0)
MCV: 88.5 fL (ref 79.5–101.0)
MONO#: 0.3 10*3/uL (ref 0.1–0.9)
MONO%: 8.1 % (ref 0.0–14.0)
NEUT%: 58.3 % (ref 38.4–76.8)
NEUTROS ABS: 2.3 10*3/uL (ref 1.5–6.5)
PLATELETS: 213 10*3/uL (ref 145–400)
RBC: 4.39 10*6/uL (ref 3.70–5.45)
RDW: 12.7 % (ref 11.2–14.5)
WBC: 3.9 10*3/uL (ref 3.9–10.3)
lymph#: 1 10*3/uL (ref 0.9–3.3)

## 2014-12-01 NOTE — Progress Notes (Signed)
[facility] CONSULT NOTE  Patient Care Team: Abner Greenspan, MD as PCP - [place] III, MD as Consulting Physician (General Surgery) Truitt Merle, MD as Consulting Physician (Hematology) Thea Silversmith, MD as Consulting Physician (Radiation Oncology) Rockwell Germany, RN as Registered Nurse Mauro Kaufmann, RN as Registered Nurse Deboraha Sprang, MD as Consulting Physician (Cardiology) Holley Bouche, NP as Nurse Practitioner (Nurse Practitioner)  CHIEF COMPLAINTS/PURPOSE OF CONSULTATION:  Newly diagnosed right breast DCIS  HISTORY OF PRESENTING ILLNESS:  [name] 64 y.o. female is here because of recent diagnosis of right breast DCIS  The cancer was detected by screening mammogram on 10/28/2014. She underwent a diagnostic mammogram on the next day, which showed a group of microcalcifications in the upper outer quadrant of right breast, spanning 11 mm. The cancer was not palpable prior to diagnosis. She underwent ultrasound-guided needle biopsy on 11/19/2014 which showed high-grade DCIS with calcification and necrosis. ER/PR was 4-6% positive.  I reviewed her records extensively and collaborated the history with the patient.  She has been feeling fatigued for the past few month, but able to function well at home. Her granddaughter lives with her. She has both knee pain from arthritis, no knee surgery yet. She also has back pain, moderate, she take tramadol, hydrocordone for pains.   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 ER 6% positive, PR 4%, positive, with weak staining intensity.    In terms of  breast cancer risk profile:  She menarched at early age of 43 and went to menopause at age 20 She had 3 pregnancy, her first child was born at age 1 She did no breast-fed her child  She received birth control pills for approximately 3-4 years  She was never exposed to fertility medications or hormone replacement therapy.  She has family history of Breast and colon cancer   MEDICAL HISTORY:  Past Medical History  Diagnosis Date  . Automatic implantable cardiac defibrillator in situ   . Paroxysmal ventricular tachycardia   . Ostium secundum type atrial septal defect   . Unspecified transient cerebral ischemia   . Takotsubo syndrome   . Otalgia, unspecified   . Other malaise and fatigue   . Unspecified vitamin D deficiency   . Headache(784.0)   . ICD (implantable cardiac defibrillator), single, BSX   . Other pulmonary embolism and infarction   . Mitral valve disorders   . Other chronic nonalcoholic liver disease   . Myalgia and myositis, unspecified   . Cerebral aneurysm, nonruptured   . Hypothyroidism   . Unspecified essential hypertension   . Other and unspecified hyperlipidemia   . Esophageal reflux   . Diverticulosis of colon (without mention of hemorrhage)   . Depressive disorder, not elsewhere classified   . Allergic rhinitis, cause unspecified   . Enlargement of lymph nodes   . Obstructive sleep apnea (adult) (pediatric)   . GI bleed   . PUD (peptic ulcer disease)   . Clostridium difficile infection   . Breast cancer of upper-outer quadrant of right female breast 11/23/2014  . Anxiety   . Arthritis     SURGICAL HISTORY: Past Surgical  History  Procedure Laterality Date  . Partial hysterectomy      Fibroids  . Cerebral aneurysm repair  02/1999  . Knee arthroscopy      bilateral  . Cardiac defibrillator placement      Guidant  . Adenosine cardiolite  2003    neg, EF 50%  . Carotid dopplers  2006    neg  . Hospitalization  2004    GI bleed, PUD, diverticulosis  (EGD,colonscopy)  . Hospitalization      PE, NSVT, s/p defib  . Abi  2006    normal  . Left heart catheterization with coronary angiogram N/A 02/22/2012    Procedure: LEFT HEART CATHETERIZATION WITH CORONARY ANGIOGRAM;  Surgeon: Burnell Blanks, MD;  Location: [facility] CATH LAB;  Service: Cardiovascular;  Laterality: N/A;  . Abdominal hysterectomy      SOCIAL HISTORY: History   Social History  . Marital Status: Divorced    Spouse Name: N/A  . Number of Children: 2  . Years of Education: N/A   Occupational History  . DISABLED   .     Social History Main Topics  . Smoking status: Former Smoker -- 1.00 packs/day for 15 years    Types: Cigarettes    Quit date: 08/20/1989  . Smokeless tobacco: Never Used     Comment: Started at 30; less than 1 PPD  . Alcohol Use: 0.0 oz/week    0 Standard drinks or equivalent per week     Comment: rare  . Drug Use: No  . Sexual Activity: Not on file   Other Topics Concern  . Not on file   Social History Narrative   Retired from [employer]      2 children      Lives alone      Divorced    FAMILY HISTORY: Family History  Problem Relation Age of Onset  . Diabetes Mother   . Alzheimer's disease Mother   . Cardiomyopathy      Family history  . Other Brother     Thyroid problem  . Allergies Sister   . Cancer Sister 86    breat cancer   . Heart disease Sister   . Cancer Sister 74    breast cancer  . Heart disease Brother   . Heart disease Father   . Colon cancer Maternal Grandmother   . Cancer Maternal Grandmother 86    colon cancer   . Lung cancer Maternal Grandfather   . Breast cancer Maternal Aunt   . Cancer Maternal Aunt 60    breast cancer     ALLERGIES:  is allergic to ace inhibitors; amitriptyline hcl; atorvastatin; cetirizine hcl; codeine; paroxetine; ramipril; rosuvastatin; carbamazepine; and dilantin.  MEDICATIONS:  Current Outpatient Prescriptions  Medication Sig Dispense Refill  . aspirin 81 MG tablet Take  81 mg by mouth daily.      . hydrochlorothiazide (HYDRODIURIL) 25 MG tablet Take 1 tablet (25 mg total) by mouth daily. 30 tablet 4  . latanoprost (XALATAN) 0.005 % ophthalmic solution Place 1 drop into both eyes at bedtime.    [name] levothyroxine (SYNTHROID) 50 MCG tablet Take 1 tablet (50 mcg total) by mouth daily. 90 tablet 3  . potassium chloride (KLOR-CON M10) 10 MEQ tablet TAKE 1/2 (5 MEQ TOTAL)     TABLET DAILY 45 tablet 3  . traMADol (ULTRAM) 50 MG tablet Take 50 mg by mouth every 8 (eight) hours as needed. For pain    . zolpidem (AMBIEN) 10 MG  tablet TAKE 1 TABLET AT BEDTIME AS NEEDED FOR SLEEP 90 tablet 1  . acetaminophen (TYLENOL) 500 MG tablet Take 500 mg by mouth every 6 (six) hours as needed. For pain    . carvedilol (COREG) 12.5 MG tablet Take 1 tablet (12.5 mg total) by mouth 2 (two) times daily with a meal. 60 tablet 5  . clobetasol cream (TEMOVATE) 0.05 % Apply topically 2 (two) times daily. For dermatitis  2  . fexofenadine (ALLEGRA) 180 MG tablet Take 180 mg by mouth daily.    . fluticasone (CUTIVATE) 0.05 % cream Apply topically 2 (two) times daily. Prn for face  2  . fluticasone (FLONASE) 50 MCG/ACT nasal spray Place 2 sprays into both nostrils daily as needed for allergies or rhinitis. 48 g 1  . HYDROcodone-acetaminophen (NORCO/VICODIN) 5-325 MG per tablet Take 1 tablet by mouth 3 (three) times daily as needed.  0  . pantoprazole (PROTONIX) 40 MG tablet TAKE 1 TABLET (40 MG TOTAL) BY MOUTH DAILY. (Patient taking differently: TAKE 1 TABLET (40 MG TOTAL) BY MOUTH DAILY) 90 tablet 1  . polyethylene glycol powder (GLYCOLAX/MIRALAX) powder TAKE 17 G BY MOUTH DAILY. 255 g 3  . simvastatin (ZOCOR) 40 MG tablet TAKE 1/2 TABLET DAILY 45 tablet 3  . [DISCONTINUED] potassium chloride (K-DUR) 10 MEQ tablet Take 0.5 tablets (5 mEq total) by mouth daily. 1/2 tab po qd 45 tablet 1   No current facility-administered medications for this visit.    REVIEW OF SYSTEMS:   Constitutional: Denies  fevers, chills or abnormal night sweats Eyes: Denies blurriness of vision, double vision or watery eyes Ears, nose, mouth, throat, and face: Denies mucositis or sore throat Respiratory: Denies cough, dyspnea or wheezes Cardiovascular: Denies palpitation, chest discomfort or lower extremity swelling Gastrointestinal:  Denies nausea, heartburn or change in bowel habits Skin: Denies abnormal skin rashes Lymphatics: Denies new lymphadenopathy or easy bruising Neurological:Denies numbness, tingling or new weaknesses Behavioral/Psych: Mood is stable, no new changes  All other systems were reviewed with the patient and are negative.  PHYSICAL EXAMINATION: ECOG PERFORMANCE STATUS: 1 - Symptomatic but completely ambulatory  Filed Vitals:   12/01/14 0901  BP: 113/61  Pulse: 71  Temp: 97.8 F (36.6 C)  Resp: 18   Filed Weights   12/01/14 0901  Weight: 204 lb 12.8 oz (92.897 kg)    GENERAL:alert, no distress and comfortable SKIN: skin color, texture, turgor are normal, no rashes or significant lesions EYES: normal, conjunctiva are pink and non-injected, sclera clear OROPHARYNX:no exudate, no erythema and lips, buccal mucosa, and tongue normal  NECK: supple, thyroid normal size, non-tender, without nodularity LYMPH:  no palpable lymphadenopathy in the cervical, axillary or inguinal LUNGS: clear to auscultation and percussion with normal breathing effort HEART: regular rate & rhythm and no murmurs and no lower extremity edema ABDOMEN:abdomen soft, non-tender and normal bowel sounds Musculoskeletal:no cyanosis of digits and no clubbing  PSYCH: alert & oriented x 3 with fluent speech NEURO: no focal motor/sensory deficits Breasts: Breast inspection showed them to be symmetrical with no nipple discharge or skin changes. Palpation of the right breasts showed a 1.5cm round mass at the upper outer quadrant, left breast and axilla exam revealed no obvious mass or node that I could  appreciate.   LABORATORY DATA:  I have reviewed the data as listed Lab Results  Component Value Date   WBC 3.9 12/01/2014   HGB 12.4 12/01/2014   HCT 38.8 12/01/2014   MCV 88.5 12/01/2014   PLT 213  12/01/2014   Lab Results  Component Value Date   NA 141 12/01/2014   K 3.8 12/01/2014   CL 101 09/27/2014   CO2 26 12/01/2014   Pathology report Diagnosis Breast, right, needle core biopsy, UOQ 11/19/2014 - HIGH GRADE DUCTAL CARCINOMA IN SITU WITH CALCIFICATIONS AND NECROSIS.  Microscopic Comment Estrogen and progesterone receptors will be performed. 1 of Results: IMMUNOHISTOCHEMICAL AND MORPHOMETRIC ANALYSIS BY THE AUTOMATED CELLULAR IMAGING SYSTEM (ACIS) Estrogen Receptor: 6%, POSITIVE, WEAK STAINING INTENSITY Progesterone Receptor: 4%, POSITIVE, WEAK STAINING INTENSITY REFERENCE RANGE ESTROGEN RECEPTOR NEGATIVE <1% POSITIVE =>1% PROGESTERONE RECEPTOR NEGATIVE <1% POSITIVE =>1%  RADIOGRAPHIC STUDIES: I have personally reviewed the radiological images as listed and agreed with the findings in the report.  Diagnostic mammogram 11/05/2014 FINDINGS: Spot compression magnification CC and ML views were performed over the slightly upper outer right breast demonstrating an indeterminate group of microcalcifications, some of which vary in shape and signs spanning a distance of approximately 11 mm.  IMPRESSION: Suspicious right breast calcifications.   Screening mammogram 10/29/2014 FINDINGS: In the right breast, calcifications warrant further evaluation with magnified views. In the left breast, no findings suspicious for malignancy. Images were processed with CAD.  IMPRESSION: Further evaluation is suggested for calcifications in the right breast.  ASSESSMENT:  64 year old postmenopausal woman with extensive past medical history of heart failure, status post ICD placement, sleep apnea and hypothyroidism, who was found to have right breast DCIS by screening  mammogram.  PLAN:  #1 right breast high grade DCIS, ER/PR weakly positive (4-6%) -We discussed the standard therapy is lumpectomy, giving the relatively small size of the tumor. She was seen by breast surgeon Dr. [name] today. -The patient has early stage disease. She is considered cured of disease by computed surgical resection alone  -Any form of adjuvant treatment is for prevention of disease recurrence.  -Giving the very weak ER/PR expression of her tumor, and her extensive cardiac disease, I do not recommend adjuvant endocrine therapy, given her surgical path does not reveal invasive disease. She is being seen and by radiation oncologist Dr. Pablo Ledger today to discuss adjuvant radiation therapy.   FOLLOW UP: I plan to see her back 3 weeks after surgery to review the surgical path, and make a final decision about adjuvant endocrine therapy.   All questions were answered. The patient knows to call the clinic with any problems, questions or concerns. I spent 55 minutes counseling the patient face to face. The total time spent in the appointment was 60 minutes and more than 50% was on counseling.     Truitt Merle, MD 12/01/2014 5:39 PM

## 2014-12-01 NOTE — Addendum Note (Signed)
Encounter addended by: Thea Silversmith, MD on: 12/01/2014 11:15 AM<BR>     Documentation filed: Notes Section

## 2014-12-01 NOTE — Progress Notes (Signed)
Subjective:     Patient ID: [name], female   DOB: 1951-05-23, 64 y.o.   MRN: [id number]  HPI   Review of Systems     Objective:   Physical Exam For the patient to understand and be given the tools to implement a healthy plant based diet during their cancer diagnosis.     Assessment:     Patient was seen today and found to be in good spirits and was accompanied by her seemingly supportive son and daughter. Current/relevant medications: hydrochlorothiazide, levothyroxine, and simvastatin. Pt does have a diagnosis of CHF. Pts labs: glucose 175. Pt states she does have difficulty walking due to her arthritis but she will take up water aerobics. Pts daughter asked about the safety of soy products for women. Pt is 5'6'' 204 pounds BMI 32.6     Plan:     Dietitian educated the patient on implementing a plant based diet by incorporating more plant proteins, fruits, and vegetables. As a part of a healthy routine physical activity was discussed. Dietitian offered a low sodium tofu recipe. Dietitian also briefly discussed a low sodium diet which if pt follows the plant based diet will be simple.   A folder of evidence based information with a focus on a plant based diet and general nutrition during cancer was given to the patient.  The importance of legitimate, evidence based information was discussed and examples were given. As a part of the continuum of care the cancer dietitian's contact information was given to the patient in the event they would like to have a follow up appointment.

## 2014-12-01 NOTE — Progress Notes (Addendum)
  Radiation Oncology         [id number]) [phone] ________________________________  Initial outpatient Consultation - Date: 12/01/2014   Name: [name] MRN: [id number]   DOB: [birth year]  REFERRING PHYSICIAN: Jovita Kussmaul, MD  DIAGNOSIS:    ICD-9-CM ICD-10-CM   1. Breast cancer of upper-outer quadrant of right female breast 174.4 C50.411     STAGE: No matching staging information was found for the patient.  HISTORY OF PRESENT ILLNESS::[name] is a 64 y.o. female  Who was found to have screening calcifications in the upper outer quadrant of the right breast. 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 She could not have an MRI due to her pacemaker.  She has healed well from her biopsy but has some post biopsy changes. She has had radiation to her abdominal incision after her hysterectomy for keloid prevention. She has 2 aunts with breast cancer. She is GxP2 with her first birth at 13.   PREVIOUS RADIATION THERAPY: No  Past medical, social and family history were reviewed in the electronic chart. Review of symptoms was reviewed in the electronic chart. Medications were reviewed in the electronic chart.   PHYSICAL EXAM: There were no vitals filed for this visit.. . Pleasant female. No distress. Palpable biopsy change in the outer aspect of the right breast. No palpable abnormalities of the left breast. No palpable lymphadenopathy.   IMPRESSION: DCIS of the right breast.   PLAN:I spoke to the patient today regarding her diagnosis and options for treatment. We discussed the equivalence in terms of survival and local failure between mastectomy and breast conservation. We discussed the role of radiation in decreasing local failures in patients who undergo lumpectomy. We discussed the process of simulation and the placement tattoos. We discussed 5 weeks of treatment as an outpatient. We discussed the possibility of asymptomatic lung damage. We  discussed the low likelihood of secondary malignancies. We discussed the possible side effects including but not limited to skin redness, fatigue, permanent skin darkening, and breast swelling.   She will undergo genetics, lumpectomy, radiation and an aromatase inhibitor.   We will have to get sign off from her cardiologist regarding her pacemaker which is on the CONTRALATERAL side.    I spent 60 minutes  face to face with the patient and more than 50% of that time was spent in counseling and/or coordination of care.   ------------------------------------------------  Thea Silversmith, MD

## 2014-12-01 NOTE — Progress Notes (Signed)
Checked in new pt with no financial concerns prior to seeing the dr. [name] has 2 insurances so financial assistance may not be needed but she has my card for any billing questions or concerns.

## 2014-12-01 NOTE — Progress Notes (Signed)
[name] is a very pleasant 64 y.o. 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  Biopsy results revealed the tumor's prognostic profile is weakly ER positive (6%) and weakly PR positive (4%).  She presents today with her son and daughter to the [facility]) for treatment consideration and recommendations from the breast surgeon, radiation oncologist, and medical oncologist.     I briefly met with [name] and her family during her [facility] visit today. We discussed the purpose of the Survivorship Clinic, which will include monitoring for recurrence, coordinating completion of age and gender-appropriate cancer screenings, promotion of overall wellness, as well as managing potential late/long-term side effects of anti-cancer treatments.    The treatment plan for [name] will likely include surgery, radiation therapy, and potentially anti-estrogen therapy.  She will meet with the Genetics Counselor due to her family history of breast cancer. As of today, the intent of treatment for [name] is cure, therefore she will be eligible for the Survivorship Clinic upon her completion of treatment.  Her survivorship care plan (SCP) document will be drafted and updated throughout the course of her treatment trajectory. She will receive the SCP in an office visit with myself in the Survivorship Clinic once she has completed treatment.   [name] was encouraged to ask questions and all questions were answered to her satisfaction.  She was given my business card and encouraged to contact me with any concerns regarding survivorship.  I look forward to participating in her care.   Mike Craze, NP [place] [phone]

## 2014-12-07 ENCOUNTER — Ambulatory Visit (HOSPITAL_COMMUNITY): Payer: Medicare Other | Attending: Cardiology

## 2014-12-07 ENCOUNTER — Telehealth: Payer: Self-pay | Admitting: *Deleted

## 2014-12-07 ENCOUNTER — Other Ambulatory Visit (INDEPENDENT_AMBULATORY_CARE_PROVIDER_SITE_OTHER): Payer: Medicare Other | Admitting: *Deleted

## 2014-12-07 ENCOUNTER — Encounter: Payer: Self-pay | Admitting: *Deleted

## 2014-12-07 DIAGNOSIS — C50919 Malignant neoplasm of unspecified site of unspecified female breast: Secondary | ICD-10-CM | POA: Diagnosis not present

## 2014-12-07 DIAGNOSIS — I4729 Other ventricular tachycardia: Secondary | ICD-10-CM

## 2014-12-07 DIAGNOSIS — I341 Nonrheumatic mitral (valve) prolapse: Secondary | ICD-10-CM | POA: Insufficient documentation

## 2014-12-07 DIAGNOSIS — I428 Other cardiomyopathies: Secondary | ICD-10-CM

## 2014-12-07 DIAGNOSIS — I10 Essential (primary) hypertension: Secondary | ICD-10-CM | POA: Insufficient documentation

## 2014-12-07 DIAGNOSIS — I429 Cardiomyopathy, unspecified: Secondary | ICD-10-CM | POA: Insufficient documentation

## 2014-12-07 DIAGNOSIS — Z79899 Other long term (current) drug therapy: Secondary | ICD-10-CM | POA: Diagnosis not present

## 2014-12-07 DIAGNOSIS — Z87891 Personal history of nicotine dependence: Secondary | ICD-10-CM | POA: Diagnosis not present

## 2014-12-07 DIAGNOSIS — I472 Ventricular tachycardia: Secondary | ICD-10-CM

## 2014-12-07 LAB — BASIC METABOLIC PANEL
BUN: 11 mg/dL (ref 6–23)
CHLORIDE: 104 meq/L (ref 96–112)
CO2: 28 meq/L (ref 19–32)
Calcium: 9.9 mg/dL (ref 8.4–10.5)
Creatinine, Ser: 0.77 mg/dL (ref 0.40–1.20)
GFR: 96.4 mL/min (ref >60.00)
GLUCOSE: 117 mg/dL — AB (ref 70–99)
Potassium: 3.8 mEq/L (ref 3.5–5.1)
Sodium: 137 mEq/L (ref 135–145)

## 2014-12-07 LAB — MAGNESIUM: Magnesium: 2.1 mg/dL (ref 1.5–2.5)

## 2014-12-07 NOTE — Telephone Encounter (Signed)
Left message for a return phone call from [facility] 12/01/14.  Awaiting patient response.

## 2014-12-07 NOTE — Progress Notes (Signed)
2D Echo completed. 12/07/2014

## 2014-12-07 NOTE — Telephone Encounter (Signed)
Received call back from patient. She is doing well.  No questions or concerns at this time.  Encouraged her to call with any needs or concerns.

## 2014-12-07 NOTE — Progress Notes (Signed)
[facility] Psychosocial Distress Screening Clinical Social Work  Clinical Social Work was referred by distress screening protocol.  The patient scored a 7 on the Psychosocial Distress Thermometer which indicates moderate distress. Clinical Social Worker contacted patient at home to offer support and assess for distress and other psychosocial needs.  Patient stated she was doing well and had no concerns at this time.  CSW informed patient of the support team and support services at [facility] and encouraged her to call with questions or concerns.      ONCBCN DISTRESS SCREENING 12/01/2014  Screening Type Initial Screening  Distress experienced in past week (1-10) 7  Practical problem type Childcare  Emotional problem type Adjusting to illness  Information Concerns Type Lack of info about treatment  Physical Problem type Pain;Mouth sores/swallowing  Physician notified of physical symptoms Yes  Referral to clinical psychology No  Referral to clinical social work Yes  Referral to dietition No  Referral to financial advocate No  Referral to support programs No  Referral to palliative care No   Johnnye Lana, MSW, LCSW, OSW-C Clinical Social Worker [place] [phone]

## 2014-12-07 NOTE — Addendum Note (Signed)
Addended by: Eulis Foster on: 12/07/2014 09:05 AM   Modules accepted: Orders

## 2014-12-08 ENCOUNTER — Other Ambulatory Visit: Payer: Medicare Other

## 2014-12-08 ENCOUNTER — Ambulatory Visit (HOSPITAL_BASED_OUTPATIENT_CLINIC_OR_DEPARTMENT_OTHER): Payer: Medicare Other | Admitting: Genetic Counselor

## 2014-12-08 ENCOUNTER — Encounter: Payer: Self-pay | Admitting: Genetic Counselor

## 2014-12-08 DIAGNOSIS — Z801 Family history of malignant neoplasm of trachea, bronchus and lung: Secondary | ICD-10-CM

## 2014-12-08 DIAGNOSIS — Z8042 Family history of malignant neoplasm of prostate: Secondary | ICD-10-CM

## 2014-12-08 DIAGNOSIS — Z803 Family history of malignant neoplasm of breast: Secondary | ICD-10-CM | POA: Diagnosis not present

## 2014-12-08 DIAGNOSIS — Z808 Family history of malignant neoplasm of other organs or systems: Secondary | ICD-10-CM

## 2014-12-08 DIAGNOSIS — C50411 Malignant neoplasm of upper-outer quadrant of right female breast: Secondary | ICD-10-CM | POA: Diagnosis present

## 2014-12-08 DIAGNOSIS — C50919 Malignant neoplasm of unspecified site of unspecified female breast: Secondary | ICD-10-CM | POA: Insufficient documentation

## 2014-12-08 DIAGNOSIS — Z800 Family history of malignant neoplasm of digestive organs: Secondary | ICD-10-CM | POA: Diagnosis not present

## 2014-12-08 NOTE — Progress Notes (Signed)
REFERRING PROVIDER: Abner Greenspan, MD [address] [place], [place] [address]   Truitt Merle, MD  PRIMARY PROVIDER:  Loura Pardon, MD  PRIMARY REASON FOR VISIT:  1. Breast cancer of upper-outer quadrant of right female breast   2. Family history of breast cancer   3. Family history of colon cancer   4. Family history of pancreatic cancer   5. Family history of prostate cancer      HISTORY OF PRESENT ILLNESS:   [name], a 64 y.o. female, was seen for a [place] cancer genetics consultation at the request of Dr. Burr Medico due to a personal and family history of cancer.  [name] presents to clinic today to discuss the possibility of a hereditary predisposition to cancer, genetic testing, and to further clarify her future cancer risks, as well as potential cancer risks for family members.   In 2016, at the age of 46, [name] was diagnosed with DCIS of the right breast.  The tumor is ER-/PR-. This will be treated with lumpectomy and radiation, although this will need to be checked out with her cardiologist.      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 ER 6% positive, PR 4%, positive, with weak staining intensity.     HORMONAL RISK FACTORS:  Menarche was at age 67.  First live birth at age 61.  OCP use for approximately 4 years.  Ovaries intact: yes.  Hysterectomy: yes.  Menopausal status: postmenopausal.  HRT use: 0 years. Colonoscopy: yes; abnormal. Mammogram within the last year: yes. Number of breast biopsies: 1. Up to date with pelvic exams:  yes. Any excessive radiation  exposure in the past:  no  Past Medical History  Diagnosis Date  . Automatic implantable cardiac defibrillator in situ   . Paroxysmal ventricular tachycardia   . Ostium secundum type atrial septal defect   . Unspecified transient cerebral ischemia   . Takotsubo syndrome   . Otalgia, unspecified   . Other malaise and fatigue   . Unspecified vitamin D deficiency   . Headache(784.0)   . ICD (implantable cardiac defibrillator), single, BSX   . Other pulmonary embolism and infarction   . Mitral valve disorders   . Other chronic nonalcoholic liver disease   . Myalgia and myositis, unspecified   . Cerebral aneurysm, nonruptured   . Hypothyroidism   . Unspecified essential hypertension   . Other and unspecified hyperlipidemia   . Esophageal reflux   . Diverticulosis of colon (without mention of hemorrhage)   . Depressive disorder, not elsewhere classified   . Allergic rhinitis, cause unspecified   . Enlargement of lymph nodes   . Obstructive sleep apnea (adult) (pediatric)   . GI bleed   . PUD (peptic ulcer disease)   . Clostridium difficile infection   . Breast cancer of upper-outer quadrant of right female breast 11/23/2014  . Anxiety   . Arthritis   . 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 Family history of breast cancer   . Family history of colon cancer   . Family history of pancreatic cancer     Past  Surgical History  Procedure Laterality Date  . Partial hysterectomy      Fibroids  . Cerebral aneurysm repair  02/1999  . Knee arthroscopy      bilateral  . Cardiac defibrillator placement      Guidant  . Adenosine cardiolite  2003    neg, EF 50%  . Carotid dopplers  2006    neg  . Hospitalization  2004    GI bleed, PUD, diverticulosis (EGD,colonscopy)  . Hospitalization      PE, NSVT, s/p defib  . Abi  2006    normal  . Left heart catheterization with coronary angiogram N/A 02/22/2012    Procedure: LEFT HEART CATHETERIZATION WITH CORONARY ANGIOGRAM;  Surgeon:  Burnell Blanks, MD;  Location: [facility] CATH LAB;  Service: Cardiovascular;  Laterality: N/A;  . Abdominal hysterectomy      History   Social History  . Marital Status: Divorced    Spouse Name: N/A  . Number of Children: 2  . Years of Education: N/A   Occupational History  . DISABLED   .     Social History Main Topics  . Smoking status: Former Smoker -- 1.00 packs/day for 15 years    Types: Cigarettes    Quit date: 08/20/1989  . Smokeless tobacco: Never Used     Comment: Started at 66; less than 1 PPD  . Alcohol Use: 0.0 oz/week    0 Standard drinks or equivalent per week     Comment: rare  . Drug Use: No  . Sexual Activity: Not on file   Other Topics Concern  . None   Social History Narrative   Retired from [employer]      2 children      Lives alone      Divorced     FAMILY HISTORY:  We obtained a detailed, 4-generation family history.  Significant diagnoses are listed below: Family History  Problem Relation Age of Onset  . Diabetes Mother   . Alzheimer's disease Mother   . Cardiomyopathy      Family history  . Other Brother     Thyroid problem  . Allergies Sister   . Heart disease Sister   . Heart disease Brother   . Prostate cancer Brother 67    same brother as throat cancer  . Throat cancer Brother     dx in his 80s; also a smoker  . Heart disease Father   . Cancer Maternal Grandmother 72    colon cancer or abdominal cancer  . Lung cancer Maternal Grandfather 55  . Breast cancer Maternal Aunt 54  . Colon cancer Maternal Aunt 72    same sister as breast at 32  . Lung cancer Maternal Uncle   . Breast cancer Maternal Aunt     dx in her 88s  . Pancreatic cancer Cousin 73    maternal first cousin  . Breast cancer Cousin     paternal first cousin twice removed died in her 41s   The paitent has two brothers and a sister, and a maternal half sister.   Her full brother and full sister are deceased due to heart disease.  Her mother died at 81 from  dementia, but there had been a spot on her breast that had been watched for several years prior to her death.  Her mother had one full brother and sister and two maternal half sisters.  One half sister was diagnosed with breast cancer in her 83s, and one  full sister was diagnosed with breast cancer at 39 and colon cancer at 85.  This sister had a daughter with pancreatic cancer at 80.  [name]'s maternal grandmother had an abdominal cancer, probably colon but possibly something else, and died in her 81s. Patient's maternal ancestors are of African American descent, and paternal ancestors are of African American descent. There is no reported Ashkenazi Jewish ancestry. There is no known consanguinity.  GENETIC COUNSELING ASSESSMENT: [name] is a 64 y.o. female with a personal and fmaily history of cancer which somewhat suggestive of a hereditary cancer syndrome and predisposition to cancer. We, therefore, discussed and recommended the following at today's visit.   DISCUSSION: We reviewed the characteristics, features and inheritance patterns of hereditary cancer syndromes. We discussed different causes of ER- breast cancer including hereditary genes such as BRCA and PALB2 as well as African history.  We discussed that sometimes breast and colon cancer families can be the result of a genetic mutation in other genes including CHEK2 or Lynch syndrome genes.  We also discussed genetic testing, including the appropriate family members to test, the process of testing, insurance coverage and turn-around-time for results. We discussed the implications of a negative, positive and/or variant of uncertain significant result. We recommended [name] pursue genetic testing for the CancerNext gene panel. The CancerNext gene panel offered by [employer] includes sequencing and rearrangement analysis for the following 32 genes:   APC, ATM, BARD1, BMPR1A, BRCA1, BRCA2, BRIP1, CDH1, CDK4,  CDKN2A, CHEK2, EPCAM, GREM1, MLH1, MRE11A, MSH2, MSH6, MUTYH, NBN, NF1, PALB2, PMS2, POLD1, POLE, PTEN, RAD50, RAD51D, SMAD4, SMARCA4, STK11, and TP53.    PLAN: After considering the risks, benefits, and limitations, [name]  provided informed consent to pursue genetic testing and the blood sample was sent to [facility] for analysis of the [place]. Results should be available within approximately 3 weeks' time, at which point they will be disclosed by telephone to [name], as will any additional recommendations warranted by these results. [name] will receive a summary of her genetic counseling visit and a copy of her results once available. This information will also be available in Epic. We encouraged Ms. Vanesha Athens to remain in contact with cancer genetics annually so that we can continuously update the family history and inform her of any changes in cancer genetics and testing that may be of benefit for her family. [name]'s questions were answered to her satisfaction today. Our contact information was provided should additional questions or concerns arise.  Lastly, we encouraged [name] to remain in contact with cancer genetics annually so that we can continuously update the family history and inform her of any changes in cancer genetics and testing that may be of benefit for this family.   Ms.  [name]'s questions were answered to her satisfaction today. Our contact information was provided should additional questions or concerns arise. Thank you for the referral and allowing [place] to share in the care of your patient.   [name] P. Florene Glen, [place], [facility] Certified Genetic Counselor [email or web address] phone: [phone]  The patient was seen for a total of 60 minutes in face-to-face genetic counseling.  This patient was discussed with Drs. Magrinat, Lindi Adie and/or Burr Medico who agrees with the above.     _______________________________________________________________________ For Office Staff:  Number of people involved in session: 1 Was an Intern/ student involved with case: no

## 2014-12-13 ENCOUNTER — Telehealth: Payer: Self-pay | Admitting: Internal Medicine

## 2014-12-13 NOTE — Telephone Encounter (Signed)
New message      Request for surgical clearance:  What type of surgery is being performed? Breast lumpectomy 1. When is this surgery scheduled? Pending clearance  2. Are there any medications that need to be held prior to surgery and how long? Aspirin and management of pacemaker  3. Name of physician performing surgery? Dr [name]  4. What is your office phone and fax number? Fax [phone]  Clearance has been faxed

## 2014-12-14 NOTE — Telephone Encounter (Signed)
Informed Michelle at [place] that I had not received clearance request form. She will re-fax clearance

## 2014-12-14 NOTE — Telephone Encounter (Signed)
Follow up ° ° ° ° °Want echo results °

## 2014-12-15 ENCOUNTER — Other Ambulatory Visit: Payer: Self-pay

## 2014-12-15 DIAGNOSIS — Z95810 Presence of automatic (implantable) cardiac defibrillator: Secondary | ICD-10-CM

## 2014-12-15 DIAGNOSIS — I472 Ventricular tachycardia: Secondary | ICD-10-CM

## 2014-12-15 DIAGNOSIS — I428 Other cardiomyopathies: Secondary | ICD-10-CM

## 2014-12-15 DIAGNOSIS — I4729 Other ventricular tachycardia: Secondary | ICD-10-CM

## 2014-12-15 MED ORDER — CARVEDILOL 12.5 MG PO TABS: 12.5000 mg | ORAL_TABLET | Freq: Two times a day (BID) | ORAL | Status: DC

## 2014-12-15 MED ORDER — SIMVASTATIN 40 MG PO TABS: ORAL_TABLET | ORAL | Status: DC

## 2014-12-15 NOTE — Telephone Encounter (Addendum)
Faxed clearance to [facility].  Patient ok to stop ASA 5 days prior to procedure, and may place magnet for surgery -- per Dr. Caryl Comes. Also reviewed echo results with patient who verbalized understanding.

## 2014-12-17 ENCOUNTER — Telehealth: Payer: Self-pay | Admitting: *Deleted

## 2014-12-17 ENCOUNTER — Other Ambulatory Visit: Payer: Self-pay

## 2014-12-17 ENCOUNTER — Other Ambulatory Visit: Payer: Self-pay | Admitting: General Surgery

## 2014-12-17 DIAGNOSIS — I472 Ventricular tachycardia: Secondary | ICD-10-CM

## 2014-12-17 DIAGNOSIS — I428 Other cardiomyopathies: Secondary | ICD-10-CM

## 2014-12-17 DIAGNOSIS — I4729 Other ventricular tachycardia: Secondary | ICD-10-CM

## 2014-12-17 DIAGNOSIS — D0511 Intraductal carcinoma in situ of right breast: Secondary | ICD-10-CM

## 2014-12-17 DIAGNOSIS — Z95810 Presence of automatic (implantable) cardiac defibrillator: Secondary | ICD-10-CM

## 2014-12-17 MED ORDER — CARVEDILOL 12.5 MG PO TABS: 12.5000 mg | ORAL_TABLET | Freq: Two times a day (BID) | ORAL | Status: DC

## 2014-12-17 MED ORDER — PANTOPRAZOLE SODIUM 40 MG PO TBEC: DELAYED_RELEASE_TABLET | ORAL | Status: DC

## 2014-12-17 NOTE — Telephone Encounter (Signed)
Patient requests refills of pantoprazole. She did schedule 01/2015 appt with Dr Hilarie Fredrickson. She requests rx now go to [place]. Rx sent.

## 2014-12-20 ENCOUNTER — Other Ambulatory Visit: Payer: Self-pay | Admitting: General Surgery

## 2014-12-20 DIAGNOSIS — D0511 Intraductal carcinoma in situ of right breast: Secondary | ICD-10-CM

## 2014-12-23 ENCOUNTER — Telehealth: Payer: Self-pay | Admitting: Genetic Counselor

## 2014-12-23 ENCOUNTER — Encounter: Payer: Self-pay | Admitting: Genetic Counselor

## 2014-12-23 DIAGNOSIS — Z1379 Encounter for other screening for genetic and chromosomal anomalies: Secondary | ICD-10-CM

## 2014-12-23 DIAGNOSIS — Z803 Family history of malignant neoplasm of breast: Secondary | ICD-10-CM

## 2014-12-23 DIAGNOSIS — Z800 Family history of malignant neoplasm of digestive organs: Secondary | ICD-10-CM

## 2014-12-23 DIAGNOSIS — C50411 Malignant neoplasm of upper-outer quadrant of right female breast: Secondary | ICD-10-CM

## 2014-12-23 NOTE — Progress Notes (Signed)
HPI: [name] was previously seen in the [place] clinic due to a personal history of breast cancer and family history of breast and pancreatic cancer and concerns regarding a hereditary predisposition to cancer. Please refer to our prior cancer genetics clinic note for more information regarding [name], social and family histories, and our assessment and recommendations, at the time. [name]'s recent genetic test results were disclosed to her, as were recommendations warranted by these results. These results and recommendations are discussed in more detail below.  GENETIC TEST RESULTS: At the time of [name]'s visit, we recommended she pursue genetic testing of the CancerNext gene panel. The CancerNext gene panel offered by [employer] includes sequencing and rearrangement analysis for the following 32 genes:   APC, ATM, BARD1, BMPR1A, BRCA1, BRCA2, BRIP1, CDH1, CDK4, CDKN2A, CHEK2, EPCAM, GREM1, MLH1, MRE11A, MSH2, MSH6, MUTYH, NBN, NF1, PALB2, PMS2, POLD1, POLE, PTEN, RAD50, RAD51D, SMAD4, SMARCA4, STK11, and TP53.  The report date is Dec 23, 2014.  Genetic testing was normal, and did not reveal a deleterious mutation in these genes. The test report has been scanned into EPIC and is located under the Media tab.   We discussed with [name] that since the current genetic testing is not perfect, it is possible there may be a gene mutation in one of these genes that current testing cannot detect, but that chance is small. We also discussed, that it is possible that another gene that has not yet been discovered, or that we have not yet tested, is responsible for the cancer diagnoses in the family, and it is, therefore, important to remain in touch with cancer genetics in the future so that we can continue to offer [name] the most up to date genetic testing.   CANCER SCREENING RECOMMENDATIONS: Given Ms. Adriana  [name]'s personal and family histories, we must interpret these negative results with some caution.  Families with features suggestive of hereditary risk for cancer tend to have multiple family members with cancer, diagnoses in multiple generations and diagnoses before the age of 86. [name]'s family exhibits some of these features. Thus this result may simply reflect our current inability to detect all mutations within these genes or there may be a different gene that has not yet been discovered or tested.   RECOMMENDATIONS FOR FAMILY MEMBERS: Women in this family might be at some increased risk of developing cancer, over the general population risk, simply due to the family history of cancer. We recommended women in this family have a yearly mammogram beginning at age 29, or 32 years younger than the earliest onset of cancer, an an annual clinical breast exam, and perform monthly breast self-exams. Women in this family should also have a gynecological exam as recommended by their primary provider. All family members should have a colonoscopy by age 61.  FOLLOW-UP: Lastly, we discussed with [name] that cancer genetics is a rapidly advancing field and it is possible that new genetic tests will be appropriate for her and/or her family members in the future. We encouraged her to remain in contact with cancer genetics on an annual basis so we can update her personal and family histories and let her know of advances in cancer genetics that may benefit this family.   Our contact number was provided. [name]'s questions were answered to her satisfaction, and she knows she is welcome to call us at anytime with  additional questions or concerns.   Adriana Kayser, MS, [facility] Certified Genetic Counselor [name]

## 2014-12-23 NOTE — Telephone Encounter (Signed)
Revealed negative genetic testing. Explained that we do not know why there is cancer in her family or why she developed breast cancer.  Discussed that there could be a hereditary cancer syndrome running in the family that she did not inherit.  Other family members should consider genetic testing, although for her children, we would not have any other testing we could offer.

## 2014-12-27 ENCOUNTER — Other Ambulatory Visit: Payer: Self-pay | Admitting: *Deleted

## 2014-12-27 ENCOUNTER — Ambulatory Visit (INDEPENDENT_AMBULATORY_CARE_PROVIDER_SITE_OTHER): Payer: Medicare Other | Admitting: Nurse Practitioner

## 2014-12-27 ENCOUNTER — Encounter: Payer: Self-pay | Admitting: Nurse Practitioner

## 2014-12-27 VITALS — BP 104/62 | HR 69 | Ht 66.5 in | Wt 204.0 lb

## 2014-12-27 DIAGNOSIS — I429 Cardiomyopathy, unspecified: Secondary | ICD-10-CM

## 2014-12-27 DIAGNOSIS — Z95810 Presence of automatic (implantable) cardiac defibrillator: Secondary | ICD-10-CM

## 2014-12-27 DIAGNOSIS — I10 Essential (primary) hypertension: Secondary | ICD-10-CM | POA: Diagnosis not present

## 2014-12-27 DIAGNOSIS — R002 Palpitations: Secondary | ICD-10-CM

## 2014-12-27 DIAGNOSIS — I428 Other cardiomyopathies: Secondary | ICD-10-CM

## 2014-12-27 LAB — CUP PACEART INCLINIC DEVICE CHECK
Lead Channel Setting Pacing Amplitude: 2.4 V
Lead Channel Setting Sensing Sensitivity: 0.4 mV
MDC IDC SESS DTM: 20160509120048
MDC IDC SET LEADCHNL RV PACING PULSEWIDTH: 0.4 ms
MDC IDC SET ZONE DETECTION INTERVAL: 250 ms
Pulse Gen Serial Number: 266301
Zone Setting Detection Interval: 286 ms
Zone Setting Detection Interval: 353 ms

## 2014-12-27 NOTE — Patient Instructions (Signed)
Medication Instructions: - no changes  Labwork: - none  Procedures/Testing: - Your physician has recommended that you wear a 24 hour holter monitor- will schedule for the week of 01/10/15. Holter monitors are medical devices that record the heart's electrical activity. Doctors most often use these monitors to diagnose arrhythmias. Arrhythmias are problems with the speed or rhythm of the heartbeat. The monitor is a small, portable device. You can wear one while you do your normal daily activities. This is usually used to diagnose what is causing palpitations/syncope (passing out).  Follow-Up: Your physician recommends that you schedule a follow-up appointment in: 4 weeks with Chanetta Marshall, NP  Any Additional Special Instructions Will Be Listed Below (If Applicable). - none

## 2014-12-27 NOTE — Progress Notes (Signed)
Electrophysiology Office Note Date: 12/27/2014  ID:  [name], [name] 1951-01-06, MRN [id number]  PCP: Loura Pardon, MD Electrophysiologist: Caryl Comes  CC: Follow up for blood pressure and palpitations  [name] is a 63 y.o. female is seen today for Dr Caryl Comes.  She was recently seen by Dr Caryl Comes for evaluation of palpitations and LE edema.  Her HCTZ was increased and echocardiogram was done which demonstrated normalization of LV function. Since last being seen in our clinic, the patient reports doing very well. She denies chest pain, dyspnea, PND, orthopnea, nausea, vomiting, syncope.  She has not had ICD shocks.   Her palpitations are somewhat improved following increased dose of Coreg.  She has had 1 episode of pre-syncope but no frank syncope.  Her blood pressure at home has been running systolic 376'E.   She has also noticed generalized itching since changing CPAP mask.  She does not have a rash, fever, or chills.   Device History: BSX single chamber ICD implanted 2005 with generator change 2011 for non-ischemic cardiomyopathy and cardiac sarcoid History of appropriate therapy: No History of AAD therapy: No   Past Medical History  Diagnosis Date  . Paroxysmal ventricular tachycardia   . Ostium secundum type atrial septal defect   . Unspecified transient cerebral ischemia   . Takotsubo syndrome   . Unspecified vitamin D deficiency   . Other pulmonary embolism and infarction   . Other chronic nonalcoholic liver disease   . Cerebral aneurysm, nonruptured   . Hypothyroidism   . Hypertension   . Hyperlipidemia   . Esophageal reflux   . Diverticulosis of colon (without mention of hemorrhage)   . Depressive disorder, not elsewhere classified   . Obstructive sleep apnea (adult) (pediatric)   . GI bleed   . PUD (peptic ulcer disease)   . Clostridium difficile infection   . Anxiety   . Arthritis   . 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 Sarcoid    Past  Surgical History  Procedure Laterality Date  . Partial hysterectomy      Fibroids  . Cerebral aneurysm repair  02/1999  . Knee arthroscopy      bilateral  . Cardiac defibrillator placement  2006; 2012    BSX single chamber ICD  . Carotid dopplers  2006    neg  . Hospitalization  2004    GI bleed, PUD, diverticulosis (EGD,colonscopy)  . Hospitalization      PE, NSVT, s/p defib  . Abi  2006    normal  . Left heart catheterization with coronary angiogram N/A 02/22/2012    Procedure: LEFT HEART CATHETERIZATION WITH CORONARY ANGIOGRAM;  Surgeon: Burnell Blanks, MD;  Location: [facility] CATH LAB;  Service: Cardiovascular;  Laterality: N/A;  . Abdominal hysterectomy      Current Outpatient Prescriptions  Medication Sig Dispense Refill  . acetaminophen (TYLENOL) 500 MG tablet Take 500 mg by mouth every 6 (six) hours as needed. For pain    . aspirin 81 MG tablet Take 81 mg by mouth daily.      . carvedilol (COREG) 12.5 MG tablet Take 1 tablet (12.5 mg total) by mouth 2 (two) times daily with a meal. 180 tablet 3  . clobetasol cream (TEMOVATE) 0.05 % Apply topically 2 (two) times daily. For dermatitis  2  . fexofenadine (ALLEGRA) 180 MG tablet Take 180 mg by mouth daily.    . fluticasone (CUTIVATE) 0.05 % cream Apply topically 2 (two)  times daily. Prn for face  2  . fluticasone (FLONASE) 50 MCG/ACT nasal spray Place 2 sprays into both nostrils daily as needed for allergies or rhinitis. 48 g 1  . hydrochlorothiazide (HYDRODIURIL) 25 MG tablet Take 1 tablet (25 mg total) by mouth daily. 30 tablet 4  . HYDROcodone-acetaminophen (NORCO/VICODIN) 5-325 MG per tablet Take 1 tablet by mouth 3 (three) times daily as needed.  0  . latanoprost (XALATAN) 0.005 % ophthalmic solution Place 1 drop into both eyes at bedtime.    [name] levothyroxine (SYNTHROID) 50 MCG tablet Take 1 tablet (50 mcg total) by mouth daily. 90 tablet 3  . pantoprazole (PROTONIX) 40 MG tablet TAKE 1 TABLET (40 MG TOTAL) BY MOUTH DAILY.  30 tablet 1  . polyethylene glycol powder (GLYCOLAX/MIRALAX) powder TAKE 17 G BY MOUTH DAILY. 255 g 3  . potassium chloride (KLOR-CON M10) 10 MEQ tablet TAKE 1/2 (5 MEQ TOTAL)     TABLET DAILY 45 tablet 3  . simvastatin (ZOCOR) 40 MG tablet TAKE 1/2 TABLET DAILY 45 tablet 3  . traMADol (ULTRAM) 50 MG tablet Take 50 mg by mouth every 8 (eight) hours as needed. For pain    . zolpidem (AMBIEN) 10 MG tablet TAKE 1 TABLET AT BEDTIME AS NEEDED FOR SLEEP 90 tablet 1  . [DISCONTINUED] potassium chloride (K-DUR) 10 MEQ tablet Take 0.5 tablets (5 mEq total) by mouth daily. 1/2 tab po qd 45 tablet 1   No current facility-administered medications for this visit.    Allergies:   Ace inhibitors; Amitriptyline hcl; Atorvastatin; Cetirizine hcl; Codeine; Paroxetine; Ramipril; Rosuvastatin; Carbamazepine; and Dilantin   Social History: History   Social History  . Marital Status: Divorced    Spouse Name: N/A  . Number of Children: 2  . Years of Education: N/A   Occupational History  . DISABLED   .     Social History Main Topics  . Smoking status: Former Smoker -- 1.00 packs/day for 15 years    Types: Cigarettes    Quit date: 08/20/1989  . Smokeless tobacco: Never Used     Comment: Started at 88; less than 1 PPD  . Alcohol Use: 0.0 oz/week    0 Standard drinks or equivalent per week     Comment: rare  . Drug Use: No  . Sexual Activity: Not on file   Other Topics Concern  . Not on file   Social History Narrative   Retired from [employer]      2 children      Lives alone      Divorced    Family History: Family History  Problem Relation Age of Onset  . Diabetes Mother   . Alzheimer's disease Mother   . Cardiomyopathy      Family history  . Other Brother     Thyroid problem  . Allergies Sister   . Heart disease Sister   . Heart disease Brother   . Prostate cancer Brother 40    same brother as throat cancer  . Throat cancer Brother     dx in his 61s; also a smoker  . Heart  disease Father   . Cancer Maternal Grandmother 78    colon cancer or abdominal cancer  . Lung cancer Maternal Grandfather 20  . Breast cancer Maternal Aunt 8  . Colon cancer Maternal Aunt 16    same sister as breast at 10  . Lung cancer Maternal Uncle   . Breast cancer Maternal Aunt  dx in her 45s  . Pancreatic cancer Cousin 50    maternal first cousin  . Breast cancer Cousin     paternal first cousin twice removed died in her 71s    Review of Systems: All other systems reviewed and are otherwise negative except as noted above.   Physical Exam: VS:  Ht 5' 6.5" (1.689 m)  Wt 204 lb (92.534 kg)  BMI 32.44 kg/m2 , BMI Body mass index is 32.44 kg/(m^2).  GEN- The patient is well appearing, alert and oriented x 3 today.   HEENT: normocephalic, atraumatic; sclera clear, conjunctiva pink; hearing intact; oropharynx clear; neck supple, no JVP Lymph- no cervical lymphadenopathy Lungs- Clear to ausculation bilaterally, normal work of breathing.  No wheezes, rales, rhonchi Heart- Regular rate and rhythm, no murmurs, rubs or gallops  GI- soft, non-tender, non-distended, bowel sounds present, no hepatosplenomegaly Extremities- no clubbing, cyanosis, or edema; DP/PT/radial pulses 2+ bilaterally MS- no significant deformity or atrophy Skin- warm and dry, no rash or lesion; ICD pocket well healed Psych- euthymic mood, full affect Neuro- strength and sensation are intact  ICD interrogation- reviewed in detail today,  See PACEART report  EKG:  EKG is not ordered today.  Recent Labs: 06/18/2014: TSH 2.82 12/01/2014: ALT 33; Hemoglobin 12.4; Platelets 213 12/07/2014: BUN 11; Creatinine 0.77; Magnesium 2.1; Potassium 3.8; Sodium 137   Wt Readings from Last 3 Encounters:  12/27/14 204 lb (92.534 kg)  12/01/14 204 lb 12.8 oz (92.897 kg)  11/25/14 206 lb 9.6 oz (93.713 kg)     Other studies Reviewed: Additional studies/ records that were reviewed today include: Dr Olin Pia office  ntoes  Assessment and Plan:  1.  Palpitations Her palpitations are somewhat improved on increased dose of Coreg.  Unable to uptitrate farther today due to BP (108/68 by me today) Will obtain 24 hour holter monitor to better qualify burden ICD monitor zone decreased to 150bpm today No PVC counter on this device  2.  HTN Stable No change required today  3.  Non ischemic cardiomyopathy EF normalized by recent echo Normal ICD function See Pace Art report No changes today   Current medicines are reviewed at length with the patient today.   The patient does not have concerns regarding her medicines.  The following changes were made today:  none  Labs/ tests ordered today include: 24 hour holter monitor   Disposition:   Follow up with me in 4 weeks to review holter results   Signed, Chanetta Marshall, NP 12/27/2014 9:20 AM  [facility] [place] [address] [phone] (office) [phone] (fax)

## 2014-12-28 ENCOUNTER — Telehealth: Payer: Self-pay | Admitting: Hematology

## 2014-12-28 ENCOUNTER — Other Ambulatory Visit: Payer: Self-pay

## 2014-12-28 DIAGNOSIS — Z95810 Presence of automatic (implantable) cardiac defibrillator: Secondary | ICD-10-CM

## 2014-12-28 DIAGNOSIS — I472 Ventricular tachycardia: Secondary | ICD-10-CM

## 2014-12-28 DIAGNOSIS — I428 Other cardiomyopathies: Secondary | ICD-10-CM

## 2014-12-28 DIAGNOSIS — I4729 Other ventricular tachycardia: Secondary | ICD-10-CM

## 2014-12-28 MED ORDER — CARVEDILOL 12.5 MG PO TABS: 12.5000 mg | ORAL_TABLET | Freq: Two times a day (BID) | ORAL | Status: DC

## 2014-12-28 MED ORDER — SIMVASTATIN 40 MG PO TABS: ORAL_TABLET | ORAL | Status: DC

## 2014-12-28 NOTE — Telephone Encounter (Signed)
per pof to sch pt appt-cld & spoke to pt and gave pt time & date of appt °

## 2014-12-28 NOTE — Telephone Encounter (Signed)
per pof ot shc pt appt-cld & spoke to pt and gave pt time & date of appt-pt understood

## 2014-12-29 ENCOUNTER — Encounter (HOSPITAL_COMMUNITY)
Admission: RE | Admit: 2014-12-29 | Discharge: 2014-12-29 | Disposition: A | Discharge status: Home / Self Care | Payer: Medicare Other | Source: Ambulatory Visit | Attending: General Surgery | Admitting: General Surgery

## 2014-12-29 ENCOUNTER — Encounter (HOSPITAL_COMMUNITY): Payer: Self-pay

## 2014-12-29 DIAGNOSIS — Z87891 Personal history of nicotine dependence: Secondary | ICD-10-CM | POA: Diagnosis not present

## 2014-12-29 DIAGNOSIS — Z86711 Personal history of pulmonary embolism: Secondary | ICD-10-CM | POA: Diagnosis not present

## 2014-12-29 DIAGNOSIS — Z8673 Personal history of transient ischemic attack (TIA), and cerebral infarction without residual deficits: Secondary | ICD-10-CM | POA: Diagnosis not present

## 2014-12-29 DIAGNOSIS — Z95810 Presence of automatic (implantable) cardiac defibrillator: Secondary | ICD-10-CM | POA: Diagnosis not present

## 2014-12-29 DIAGNOSIS — N6021 Fibroadenosis of right breast: Secondary | ICD-10-CM | POA: Diagnosis not present

## 2014-12-29 DIAGNOSIS — E780 Pure hypercholesterolemia: Secondary | ICD-10-CM | POA: Diagnosis not present

## 2014-12-29 DIAGNOSIS — K219 Gastro-esophageal reflux disease without esophagitis: Secondary | ICD-10-CM | POA: Diagnosis not present

## 2014-12-29 DIAGNOSIS — I739 Peripheral vascular disease, unspecified: Secondary | ICD-10-CM | POA: Diagnosis not present

## 2014-12-29 DIAGNOSIS — I509 Heart failure, unspecified: Secondary | ICD-10-CM | POA: Diagnosis not present

## 2014-12-29 DIAGNOSIS — Z8711 Personal history of peptic ulcer disease: Secondary | ICD-10-CM | POA: Diagnosis not present

## 2014-12-29 DIAGNOSIS — Z6832 Body mass index (BMI) 32.0-32.9, adult: Secondary | ICD-10-CM | POA: Diagnosis not present

## 2014-12-29 DIAGNOSIS — Z170 Estrogen receptor positive status [ER+]: Secondary | ICD-10-CM | POA: Diagnosis not present

## 2014-12-29 DIAGNOSIS — F419 Anxiety disorder, unspecified: Secondary | ICD-10-CM | POA: Diagnosis not present

## 2014-12-29 DIAGNOSIS — G4733 Obstructive sleep apnea (adult) (pediatric): Secondary | ICD-10-CM | POA: Diagnosis not present

## 2014-12-29 DIAGNOSIS — I10 Essential (primary) hypertension: Secondary | ICD-10-CM | POA: Diagnosis not present

## 2014-12-29 DIAGNOSIS — Z90710 Acquired absence of both cervix and uterus: Secondary | ICD-10-CM | POA: Diagnosis not present

## 2014-12-29 DIAGNOSIS — D241 Benign neoplasm of right breast: Secondary | ICD-10-CM | POA: Diagnosis not present

## 2014-12-29 DIAGNOSIS — E039 Hypothyroidism, unspecified: Secondary | ICD-10-CM | POA: Diagnosis not present

## 2014-12-29 DIAGNOSIS — F1099 Alcohol use, unspecified with unspecified alcohol-induced disorder: Secondary | ICD-10-CM | POA: Diagnosis not present

## 2014-12-29 DIAGNOSIS — D0511 Intraductal carcinoma in situ of right breast: Secondary | ICD-10-CM | POA: Diagnosis not present

## 2014-12-29 DIAGNOSIS — I252 Old myocardial infarction: Secondary | ICD-10-CM | POA: Diagnosis not present

## 2014-12-29 HISTORY — DX: Presence of automatic (implantable) cardiac defibrillator: Z95.810

## 2014-12-29 HISTORY — DX: Presence of cardiac pacemaker: Z95.0

## 2014-12-29 HISTORY — DX: Cerebral infarction, unspecified: I63.9

## 2014-12-29 LAB — COMPREHENSIVE METABOLIC PANEL
ALT: 29 U/L (ref 14–54)
AST: 33 U/L (ref 15–41)
Albumin: 3.8 g/dL (ref 3.5–5.0)
Alkaline Phosphatase: 111 U/L (ref 38–126)
Anion gap: 6 (ref 5–15)
BUN: 9 mg/dL (ref 6–20)
CO2: 29 mmol/L (ref 22–32)
CREATININE: 0.88 mg/dL (ref 0.44–1.00)
Calcium: 9.9 mg/dL (ref 8.9–10.3)
Chloride: 104 mmol/L (ref 101–111)
GFR calc Af Amer: >60 mL/min (ref >60)
GFR calc non Af Amer: >60 mL/min (ref >60)
GLUCOSE: 109 mg/dL — AB (ref 70–99)
Potassium: 3.5 mmol/L (ref 3.5–5.1)
SODIUM: 139 mmol/L (ref 135–145)
TOTAL PROTEIN: 7.3 g/dL (ref 6.5–8.1)
Total Bilirubin: 0.5 mg/dL (ref 0.3–1.2)

## 2014-12-29 LAB — CBC
HCT: 39.7 % (ref 36.0–46.0)
Hemoglobin: 12.6 g/dL (ref 12.0–15.0)
MCH: 28 pg (ref 26.0–34.0)
MCHC: 31.7 g/dL (ref 30.0–36.0)
MCV: 88.2 fL (ref 78.0–100.0)
PLATELETS: 215 10*3/uL (ref 150–400)
RBC: 4.5 MIL/uL (ref 3.87–5.11)
RDW: 12.4 % (ref 11.5–15.5)
WBC: 4.1 10*3/uL (ref 4.0–10.5)

## 2014-12-29 NOTE — Progress Notes (Signed)
Pt. Stated she fell in the yard Sat.12/25/14. States she fell on her right side and now she is sore on the right:knee,hip, shoulder, neck and head. Pt. Did not lose concious. Notified Dr. Ethlyn Gallery office, spoke to [place] in triage.Stated she will give him the message.  Pt. Stated she has been having palpations the past 2 months. Dr. Caryl Comes is aware and she is scheduled to wear holter monitor tomorrow 12/30/14 for next 24 hours. Allison,PA aware.

## 2014-12-29 NOTE — Pre-Procedure Instructions (Signed)
[name]  12/29/2014   Your procedure is scheduled on:  Monday, May 16  Report to [facility] Main Entrance "A" at 5:30 AM.  Call this number if you have problems the morning of surgery: [phone]               For any questions prior to surgery call pre-admission [phone] (monday-Friday 8:00am-4:00pm)   Remember:   Do not eat food or drink liquids after midnight.   Take these medicines the morning of surgery with A SIP OF WATER: tylenol if needed, carvedilol, allegra if needed, flonase if needed,  Hydrocodone or tramadol if needed, levothyroxine,                    Stop aspirin today. Stop vitamins and herbal medicines   Do not wear jewelry, make-up or nail polish.  Do not wear lotions, powders, or perfumes. You may wear deodorant.  Do not shave 48 hours prior to surgery. Men may shave face and neck.  Do not bring valuables to the hospital.  [facility] is not responsible  for any belongings or valuables.               Contacts, dentures or bridgework may not be worn into surgery.  Leave suitcase in the car. After surgery it may be brought to your room.  For patients admitted to the hospital, discharge time is determined by your                treatment team.               Patients discharged the day of surgery will not be allowed to drive  home.  Name and phone number of your driver: [name] [phone]  Special Instructions: review preparing for surgery handout   Please read over the following fact sheets that you were given: Pain Booklet, Coughing and Deep Breathing and Surgical Site Infection Prevention

## 2014-12-29 NOTE — Progress Notes (Addendum)
Anesthesia Chart Review:  Pt is 64 year old female scheduled for R breast lumpectomy with radioactive seed localization on 01/03/2015 with Dr. [name].   EP cardiologist is Dr. Caryl Comes.   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 Former smoker. BMI 32.5.   Pt has been complaining of palpitations for several months. Wore Holter monitor in 2013 and found to have PVCs. Pt will be wearing 24 hour Holter monitor on 12/30/2014 to evaluate palpitations.   Medications include: ASA, carvedilol, hctz, levothyroxine  Preoperative labs reviewed.    EKG 09/27/2014: NSR. Cannot rule out anterior infarct, age undetermined. Appears unchanged when compared with prior EKG tracing 02/18/2014.   Echo 12/07/2014:  - Left ventricle: The cavity size was normal. Wall thickness was normal. Systolic function was normal. The estimated ejection fraction was in the range of 50% to 55%. Wall motion was normal; there were no regional wall motion abnormalities. Doppler parameters are consistent with abnormal left ventricular relaxation (grade 1 diastolic dysfunction). - Pulmonary arteries: Systolic pressure was mildly increased. PA peak pressure: 36 mm Hg (S). Impressions:- Low normal LV function; EF 41-28; grade 1 diastolic dysfunction;trace MR and TR; mildly elevated pulmonary pressure.  Cardiac cath 01/14/2004: -Normal coronary arteries.  -Non-Q wave myocardial infarction with regional all motion abnormalities. This is consistent with a Tako-Tsube cardiomyopathy. Another alternate explanation could be coronary spasm. There are no fixedobstructive lesions and no evidence of ruptured plaque.  Pt has cardiac clearance for surgery from Dr. Caryl Comes in [place] telephone encounter by Trinidad Curet dated 12/15/2014.    If heart rate normal DOS, I anticipate pt can proceed as scheduled.   Willeen Cass, FNP-BC [facility]/Anesthesiology Phone: [phone] 12/30/2014 1:05 PM

## 2014-12-30 ENCOUNTER — Ambulatory Visit (INDEPENDENT_AMBULATORY_CARE_PROVIDER_SITE_OTHER): Payer: Medicare Other

## 2014-12-30 ENCOUNTER — Encounter: Payer: Self-pay | Admitting: *Deleted

## 2014-12-30 DIAGNOSIS — R002 Palpitations: Secondary | ICD-10-CM

## 2014-12-30 NOTE — Progress Notes (Signed)
Patient ID: [name], female   DOB: December 01, 1950, 64 y.o.   MRN: [id number] [employer] 24 hour holter monitor applied to patient.

## 2014-12-31 ENCOUNTER — Telehealth: Payer: Self-pay | Admitting: Pulmonary Disease

## 2014-12-31 ENCOUNTER — Ambulatory Visit
Admission: RE | Admit: 2014-12-31 | Discharge: 2014-12-31 | Disposition: A | Discharge status: Home / Self Care | Payer: Medicare Other | Source: Ambulatory Visit | Attending: General Surgery | Admitting: General Surgery

## 2014-12-31 DIAGNOSIS — D0511 Intraductal carcinoma in situ of right breast: Secondary | ICD-10-CM

## 2014-12-31 NOTE — Telephone Encounter (Signed)
Let pt know that download looks great.  Needs to try and wear 6 hrs a night if possible Keep upcoming apptm with me.

## 2014-12-31 NOTE — Telephone Encounter (Signed)
Spoke with pt. She would like [place] to review download of CPAP. Created download report in [place]. Will place this in [place]'s green folder.  [place] - please advise. Thanks.

## 2015-01-03 ENCOUNTER — Ambulatory Visit
Admission: RE | Admit: 2015-01-03 | Discharge: 2015-01-03 | Disposition: A | Discharge status: Home / Self Care | Payer: Medicare Other | Source: Ambulatory Visit | Attending: General Surgery | Admitting: General Surgery

## 2015-01-03 ENCOUNTER — Ambulatory Visit (HOSPITAL_COMMUNITY)
Admission: RE | Admit: 2015-01-03 | Discharge: 2015-01-03 | Disposition: A | Discharge status: Home / Self Care | Payer: Medicare Other | Source: Ambulatory Visit | Attending: General Surgery | Admitting: General Surgery

## 2015-01-03 ENCOUNTER — Ambulatory Visit (HOSPITAL_COMMUNITY): Payer: Medicare Other | Admitting: Certified Registered"

## 2015-01-03 ENCOUNTER — Encounter (HOSPITAL_COMMUNITY)
Admission: RE | Disposition: A | Discharge status: Home / Self Care | Payer: Self-pay | Source: Ambulatory Visit | Attending: General Surgery

## 2015-01-03 ENCOUNTER — Encounter (HOSPITAL_COMMUNITY): Payer: Self-pay | Admitting: *Deleted

## 2015-01-03 DIAGNOSIS — Z8711 Personal history of peptic ulcer disease: Secondary | ICD-10-CM | POA: Insufficient documentation

## 2015-01-03 DIAGNOSIS — F419 Anxiety disorder, unspecified: Secondary | ICD-10-CM | POA: Insufficient documentation

## 2015-01-03 DIAGNOSIS — D241 Benign neoplasm of right breast: Secondary | ICD-10-CM | POA: Diagnosis not present

## 2015-01-03 DIAGNOSIS — G4733 Obstructive sleep apnea (adult) (pediatric): Secondary | ICD-10-CM | POA: Insufficient documentation

## 2015-01-03 DIAGNOSIS — Z95810 Presence of automatic (implantable) cardiac defibrillator: Secondary | ICD-10-CM | POA: Insufficient documentation

## 2015-01-03 DIAGNOSIS — E780 Pure hypercholesterolemia: Secondary | ICD-10-CM | POA: Insufficient documentation

## 2015-01-03 DIAGNOSIS — Z170 Estrogen receptor positive status [ER+]: Secondary | ICD-10-CM | POA: Insufficient documentation

## 2015-01-03 DIAGNOSIS — Z8673 Personal history of transient ischemic attack (TIA), and cerebral infarction without residual deficits: Secondary | ICD-10-CM | POA: Insufficient documentation

## 2015-01-03 DIAGNOSIS — K219 Gastro-esophageal reflux disease without esophagitis: Secondary | ICD-10-CM | POA: Insufficient documentation

## 2015-01-03 DIAGNOSIS — N6021 Fibroadenosis of right breast: Secondary | ICD-10-CM | POA: Insufficient documentation

## 2015-01-03 DIAGNOSIS — Z86711 Personal history of pulmonary embolism: Secondary | ICD-10-CM | POA: Insufficient documentation

## 2015-01-03 DIAGNOSIS — F1099 Alcohol use, unspecified with unspecified alcohol-induced disorder: Secondary | ICD-10-CM | POA: Insufficient documentation

## 2015-01-03 DIAGNOSIS — I252 Old myocardial infarction: Secondary | ICD-10-CM | POA: Insufficient documentation

## 2015-01-03 DIAGNOSIS — Z90710 Acquired absence of both cervix and uterus: Secondary | ICD-10-CM | POA: Insufficient documentation

## 2015-01-03 DIAGNOSIS — D0511 Intraductal carcinoma in situ of right breast: Secondary | ICD-10-CM | POA: Diagnosis not present

## 2015-01-03 DIAGNOSIS — I10 Essential (primary) hypertension: Secondary | ICD-10-CM | POA: Insufficient documentation

## 2015-01-03 DIAGNOSIS — E039 Hypothyroidism, unspecified: Secondary | ICD-10-CM | POA: Insufficient documentation

## 2015-01-03 DIAGNOSIS — Z6832 Body mass index (BMI) 32.0-32.9, adult: Secondary | ICD-10-CM | POA: Insufficient documentation

## 2015-01-03 DIAGNOSIS — I739 Peripheral vascular disease, unspecified: Secondary | ICD-10-CM | POA: Insufficient documentation

## 2015-01-03 DIAGNOSIS — Z87891 Personal history of nicotine dependence: Secondary | ICD-10-CM | POA: Insufficient documentation

## 2015-01-03 DIAGNOSIS — I509 Heart failure, unspecified: Secondary | ICD-10-CM | POA: Insufficient documentation

## 2015-01-03 HISTORY — PX: BREAST LUMPECTOMY WITH RADIOACTIVE SEED LOCALIZATION: SHX6424

## 2015-01-03 SURGERY — BREAST LUMPECTOMY WITH RADIOACTIVE SEED LOCALIZATION
Anesthesia: General | Laterality: Right

## 2015-01-03 MED ORDER — PROPOFOL 10 MG/ML IV BOLUS
INTRAVENOUS | Status: AC
  Filled 2015-01-03: qty 20

## 2015-01-03 MED ORDER — EPHEDRINE SULFATE 50 MG/ML IJ SOLN
INTRAMUSCULAR | Status: AC
  Filled 2015-01-03: qty 1

## 2015-01-03 MED ORDER — PHENYLEPHRINE HCL 10 MG/ML IJ SOLN
INTRAMUSCULAR | Status: DC | PRN
  Administered 2015-01-03: 80 ug via INTRAVENOUS
  Administered 2015-01-03 (×3): 40 ug via INTRAVENOUS
  Administered 2015-01-03: 120 ug via INTRAVENOUS

## 2015-01-03 MED ORDER — DEXAMETHASONE SODIUM PHOSPHATE 4 MG/ML IJ SOLN
INTRAMUSCULAR | Status: AC
  Filled 2015-01-03: qty 2

## 2015-01-03 MED ORDER — 0.9 % SODIUM CHLORIDE (POUR BTL) OPTIME
TOPICAL | Status: DC | PRN
  Administered 2015-01-03: 1000 mL

## 2015-01-03 MED ORDER — BUPIVACAINE-EPINEPHRINE 0.25% -1:200000 IJ SOLN
INTRAMUSCULAR | Status: DC | PRN
  Administered 2015-01-03: 20 mL

## 2015-01-03 MED ORDER — FENTANYL CITRATE (PF) 100 MCG/2ML IJ SOLN
INTRAMUSCULAR | Status: DC | PRN
  Administered 2015-01-03: 25 ug via INTRAVENOUS
  Administered 2015-01-03: 50 ug via INTRAVENOUS
  Administered 2015-01-03: 25 ug via INTRAVENOUS

## 2015-01-03 MED ORDER — SODIUM CHLORIDE 0.9 % IJ SOLN
INTRAMUSCULAR | Status: AC
  Filled 2015-01-03: qty 10

## 2015-01-03 MED ORDER — FENTANYL CITRATE (PF) 250 MCG/5ML IJ SOLN
INTRAMUSCULAR | Status: AC
  Filled 2015-01-03: qty 5

## 2015-01-03 MED ORDER — EPHEDRINE SULFATE 50 MG/ML IJ SOLN
INTRAMUSCULAR | Status: DC | PRN
  Administered 2015-01-03 (×5): 10 mg via INTRAVENOUS

## 2015-01-03 MED ORDER — LIDOCAINE HCL (CARDIAC) 20 MG/ML IV SOLN
INTRAVENOUS | Status: DC | PRN
  Administered 2015-01-03: 60 mg via INTRAVENOUS

## 2015-01-03 MED ORDER — ONDANSETRON HCL 4 MG/2ML IJ SOLN
INTRAMUSCULAR | Status: AC
  Filled 2015-01-03: qty 2

## 2015-01-03 MED ORDER — ROCURONIUM BROMIDE 50 MG/5ML IV SOLN
INTRAVENOUS | Status: AC
  Filled 2015-01-03: qty 1

## 2015-01-03 MED ORDER — BUPIVACAINE-EPINEPHRINE (PF) 0.25% -1:200000 IJ SOLN
INTRAMUSCULAR | Status: AC
  Filled 2015-01-03: qty 30

## 2015-01-03 MED ORDER — MIDAZOLAM HCL 5 MG/5ML IJ SOLN
INTRAMUSCULAR | Status: DC | PRN
  Administered 2015-01-03 (×2): 1 mg via INTRAVENOUS

## 2015-01-03 MED ORDER — MIDAZOLAM HCL 2 MG/2ML IJ SOLN
INTRAMUSCULAR | Status: AC
  Filled 2015-01-03: qty 2

## 2015-01-03 MED ORDER — PROMETHAZINE HCL 25 MG/ML IJ SOLN: 6.2500 mg | INTRAMUSCULAR | Status: DC | PRN

## 2015-01-03 MED ORDER — HYDROCODONE-ACETAMINOPHEN 5-325 MG PO TABS: 1.0000 | ORAL_TABLET | ORAL | Status: DC | PRN

## 2015-01-03 MED ORDER — LIDOCAINE HCL (CARDIAC) 20 MG/ML IV SOLN
INTRAVENOUS | Status: AC
  Filled 2015-01-03: qty 10

## 2015-01-03 MED ORDER — CEFAZOLIN SODIUM-DEXTROSE 2-3 GM-% IV SOLR
2.0000 g | INTRAVENOUS | Status: AC
  Administered 2015-01-03: 2 g via INTRAVENOUS
  Filled 2015-01-03: qty 50

## 2015-01-03 MED ORDER — OXYCODONE HCL 5 MG PO TABS: 5.0000 mg | ORAL_TABLET | Freq: Once | ORAL | Status: DC | PRN

## 2015-01-03 MED ORDER — HYDROMORPHONE HCL 1 MG/ML IJ SOLN
0.2500 mg | INTRAMUSCULAR | Status: DC | PRN
  Administered 2015-01-03 (×2): 0.5 mg via INTRAVENOUS

## 2015-01-03 MED ORDER — LACTATED RINGERS IV SOLN
INTRAVENOUS | Status: DC | PRN
  Administered 2015-01-03: 07:00:00 via INTRAVENOUS

## 2015-01-03 MED ORDER — STERILE WATER FOR INJECTION IJ SOLN
INTRAMUSCULAR | Status: AC
  Filled 2015-01-03: qty 20

## 2015-01-03 MED ORDER — PHENYLEPHRINE 40 MCG/ML (10ML) SYRINGE FOR IV PUSH (FOR BLOOD PRESSURE SUPPORT)
PREFILLED_SYRINGE | INTRAVENOUS | Status: AC
  Filled 2015-01-03: qty 10

## 2015-01-03 MED ORDER — CHLORHEXIDINE GLUCONATE 4 % EX LIQD
1.0000 "application " | Freq: Once | CUTANEOUS | Status: DC
  Filled 2015-01-03: qty 15

## 2015-01-03 MED ORDER — ARTIFICIAL TEARS OP OINT
TOPICAL_OINTMENT | OPHTHALMIC | Status: AC
  Filled 2015-01-03: qty 14

## 2015-01-03 MED ORDER — CHLORHEXIDINE GLUCONATE 4 % EX LIQD: 1.0000 "application " | Freq: Once | CUTANEOUS | Status: DC

## 2015-01-03 MED ORDER — LIDOCAINE HCL (CARDIAC) 20 MG/ML IV SOLN
INTRAVENOUS | Status: AC
  Filled 2015-01-03: qty 5

## 2015-01-03 MED ORDER — ONDANSETRON HCL 4 MG/2ML IJ SOLN
INTRAMUSCULAR | Status: DC | PRN
  Administered 2015-01-03: 4 mg via INTRAVENOUS

## 2015-01-03 MED ORDER — OXYCODONE HCL 5 MG/5ML PO SOLN: 5.0000 mg | Freq: Once | ORAL | Status: DC | PRN

## 2015-01-03 MED ORDER — SUCCINYLCHOLINE CHLORIDE 20 MG/ML IJ SOLN
INTRAMUSCULAR | Status: AC
  Filled 2015-01-03: qty 1

## 2015-01-03 MED ORDER — HYDROMORPHONE HCL 1 MG/ML IJ SOLN
INTRAMUSCULAR | Status: AC
  Filled 2015-01-03: qty 1

## 2015-01-03 MED ORDER — PROPOFOL 10 MG/ML IV BOLUS
INTRAVENOUS | Status: DC | PRN
  Administered 2015-01-03: 150 mg via INTRAVENOUS

## 2015-01-03 SURGICAL SUPPLY — 41 items
APPLIER CLIP 9.375 MED OPEN (MISCELLANEOUS) ×3
APR CLP MED 9.3 20 MLT OPN (MISCELLANEOUS) ×1
BINDER BREAST LRG (GAUZE/BANDAGES/DRESSINGS) IMPLANT
BINDER BREAST XLRG (GAUZE/BANDAGES/DRESSINGS) IMPLANT
BLADE SURG 15 STRL LF DISP TIS (BLADE) ×1 IMPLANT
BLADE SURG 15 STRL SS (BLADE) ×3
CANISTER SUCTION 2500CC (MISCELLANEOUS) IMPLANT
CHLORAPREP W/TINT 26ML (MISCELLANEOUS) ×3 IMPLANT
CLIP APPLIE 9.375 MED OPEN (MISCELLANEOUS) IMPLANT
COVER PROBE W GEL 5X96 (DRAPES) ×3 IMPLANT
DEVICE DUBIN SPECIMEN MAMMOGRA (MISCELLANEOUS) ×3 IMPLANT
DRAPE CHEST BREAST 15X10 FENES (DRAPES) ×3 IMPLANT
ELECT CAUTERY BLADE 6.4 (BLADE) ×3 IMPLANT
ELECT REM PT RETURN 9FT ADLT (ELECTROSURGICAL) ×3
ELECTRODE REM PT RTRN 9FT ADLT (ELECTROSURGICAL) ×1 IMPLANT
GLOVE BIO SURGEON STRL SZ7 (GLOVE) ×5 IMPLANT
GLOVE BIOGEL PI IND STRL 7.5 (GLOVE) ×1 IMPLANT
GLOVE BIOGEL PI INDICATOR 7.5 (GLOVE) ×2
GOWN STRL REUS W/ TWL LRG LVL3 (GOWN DISPOSABLE) ×2 IMPLANT
GOWN STRL REUS W/TWL LRG LVL3 (GOWN DISPOSABLE) ×6
KIT BASIN OR (CUSTOM PROCEDURE TRAY) ×3 IMPLANT
KIT MARKER MARGIN INK (KITS) ×3 IMPLANT
LIQUID BAND (GAUZE/BANDAGES/DRESSINGS) ×2 IMPLANT
NDL HYPO 25X1 1.5 SAFETY (NEEDLE) ×1 IMPLANT
NEEDLE HYPO 25X1 1.5 SAFETY (NEEDLE) ×3 IMPLANT
NS IRRIG 1000ML POUR BTL (IV SOLUTION) ×2 IMPLANT
PACK SURGICAL SETUP 50X90 (CUSTOM PROCEDURE TRAY) ×3 IMPLANT
PENCIL BUTTON HOLSTER BLD 10FT (ELECTRODE) ×3 IMPLANT
SPONGE LAP 18X18 X RAY DECT (DISPOSABLE) ×3 IMPLANT
SUT MNCRL AB 4-0 PS2 18 (SUTURE) ×3 IMPLANT
SUT SILK 2 0 SH (SUTURE) IMPLANT
SUT VIC AB 2-0 SH 27 (SUTURE) ×3
SUT VIC AB 2-0 SH 27XBRD (SUTURE) ×1 IMPLANT
SUT VIC AB 3-0 SH 27 (SUTURE) ×3
SUT VIC AB 3-0 SH 27X BRD (SUTURE) ×1 IMPLANT
SYR CONTROL 10ML LL (SYRINGE) ×3 IMPLANT
TOWEL OR 17X24 6PK STRL BLUE (TOWEL DISPOSABLE) ×3 IMPLANT
TOWEL OR 17X26 10 PK STRL BLUE (TOWEL DISPOSABLE) ×3 IMPLANT
TUBE CONNECTING 12'X1/4 (SUCTIONS) ×1
TUBE CONNECTING 12X1/4 (SUCTIONS) ×1 IMPLANT
YANKAUER SUCT BULB TIP NO VENT (SUCTIONS) IMPLANT

## 2015-01-03 NOTE — Transfer of Care (Signed)
Immediate Anesthesia Transfer of Care Note  Patient: [name]  Procedure(s) Performed: Procedure(s): BREAST LUMPECTOMY WITH RADIOACTIVE SEED LOCALIZATION (Right)  Patient Location: PACU  Anesthesia Type:General  Level of Consciousness: awake, alert  and oriented  Airway & Oxygen Therapy: Patient Spontanous Breathing and Patient connected to nasal cannula oxygen  Post-op Assessment: Report given to RN and Post -op Vital signs reviewed and stable  Post vital signs: Reviewed and stable  Last Vitals:  Filed Vitals:   01/03/15 0614  BP: 109/45  Pulse: 58  Temp: 36.6 C  Resp: 20    Complications: No apparent anesthesia complications

## 2015-01-03 NOTE — Op Note (Signed)
01/03/2015  8:29 AM  PATIENT:  [name]  64 y.o. female  PRE-OPERATIVE DIAGNOSIS:  Right Breast DCIS  POST-OPERATIVE DIAGNOSIS:  Right Breast DCIS  PROCEDURE:  Procedure(s): Rightr BREAST LUMPECTOMY WITH RADIOACTIVE SEED LOCALIZATION (Right)  SURGEON:  Surgeon(s) and Role:    * Jovita Kussmaul, MD - Primary  PHYSICIAN ASSISTANT:   ASSISTANTS: none   ANESTHESIA:   general  EBL:     BLOOD ADMINISTERED:none  DRAINS: none   LOCAL MEDICATIONS USED:  MARCAINE     SPECIMEN:  Source of Specimen:  right breast tissue  DISPOSITION OF SPECIMEN:  PATHOLOGY  COUNTS:  YES  TOURNIQUET:  * No tourniquets in log *  DICTATION: .Dragon Dictation  After informed consent was obtained the patient was brought to the operating room and placed in the supine position on the operating room table. After adequate induction of general anesthesia the patient's right breast was prepped with ChloraPrep, allowed to dry, and draped in usual sterile manner. 3 days ago the patient had an I-125 seed placed in the upper outer quadrant of the right breast to mark an area of DCIS. The neoprobe sent to I-125 was used to identify the area of radioactivity in the upper outer quadrant of the right breast. An elliptical incision was made in the skin overlying the area of radioactivity with a 15 blade knife. The incision was carried through the skin and subcutaneous tissue sharply with the electrocautery. Using the neoprobe to check the position of the radioactivity frequently a circular portion of breast tissue was excised sharply with electrocautery around the area of radioactivity. Once the specimen was removed it was oriented with the appropriate paint colors. The radioactivity was confirmed in the breast specimen. There was no further radioactivity in the breast. A specimen radiograph was obtained that showed the clip and seen to be in the center of the specimen. The specimen was then sent to pathology for further  evaluation and there was confirmation by pathology that they receive proceed. Hemostasis was achieved using the Bovie electrocautery. The wound was irrigated with saline and infiltrated with quarter percent Marcaine. The lumpectomy cavity was marked with clips. The deep layer of the wound was then closed with interrupted 3-0 Vicryl stitches. The skin was closed with interrupted 4-0 Monocryl subcuticular stitches. Dermabond dressings were applied. The patient tolerated the procedure well. At the end of the case all needle sponge and instrument counts were correct. The patient was then awakened and taken to recovery in stable condition.  PLAN OF CARE: Discharge to home after PACU  PATIENT DISPOSITION:  PACU - hemodynamically stable.   Delay start of Pharmacological VTE agent (>24hrs) due to surgical blood loss or risk of bleeding: not applicable

## 2015-01-03 NOTE — H&P (Signed)
[name] 12/01/2014 7:46 AM Location: [facility] Patient #: [id number] DOB: [birth year] Undefined / Language: Suszanne [name] / Race: Undefined Female  History of Present Illness [name]. [name] MD; 12/01/2014 12:12 PM) The patient is a 64 year old female who presents with breast cancer. We're asked to see the patient in consultation by Dr. [name] to evaluate her for a right breast cancer. The patient is a 64 year old black female who recently went for a routine screening mammogram. At that time she was found to have an abnormal area of calcification in the upper outer quadrant of the right breast. This measured 1.1 cm. 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 It was weakly ER and PR positive. She has a long history of bilateral breast soreness. She denies any discharge from the nipple. Of note she also has a pacemaker/defibrillator on the left chest wall. She does not take any hormone replacement.   Other Problems [name] [place], [place]; 12/01/2014 7:46 AM) Anxiety Disorder Arthritis Atrial Fibrillation Back Pain Breast Cancer Cerebrovascular Accident Congestive Heart Failure Diverticulosis Gastric Ulcer Gastroesophageal Reflux Disease Heart murmur Hemorrhoids High blood pressure Hypercholesterolemia Lump In Breast Myocardial infarction Sleep Apnea Thyroid Disease Transfusion history Umbilical Hernia Repair  Past Surgical History [name] [place], RMA; 12/01/2014 7:46 AM) Aneurysm Repair Appendectomy Breast Biopsy Right. Colon Polyp Removal - Colonoscopy Hysterectomy (not due to cancer) - Partial Knee Surgery Bilateral. Oral Surgery  Diagnostic Studies History [name] [place], [place]; 12/01/2014 7:46 AM) Colonoscopy 1-5 years ago Mammogram within last year Pap Smear >5 years ago  Social History [name] [name], RMA; 12/01/2014 7:46 AM) Alcohol use Occasional alcohol use. No caffeine use No drug use Tobacco use Former  smoker.  Family History [name] [place], [place]; 12/01/2014 7:46 AM) Alcohol Abuse Family Members In General, Son. Arthritis Brother, Family Members In General, Mother, Sister. Breast Cancer Family Members In General, Mother. Colon Cancer Family Members In General. Depression Daughter, Family Members In General, Sister. Diabetes Mellitus Brother, Mother. Heart Disease Brother, Father, Sister, Son. Heart disease in female family member before age 66 Hypertension Brother, Sister. Kidney Disease Brother. Malignant Neoplasm Of Pancreas Family Members In General. Migraine Headache Brother, Sister. Respiratory Condition Family Members In General. Seizure disorder Sister. Thyroid problems Family Members In General, Sister.  Pregnancy / Birth History [name] [name], [place]; 12/01/2014 7:46 AM) Age at menarche 21 years. Age of menopause 81-50 Contraceptive History Intrauterine device, Oral contraceptives. Gravida 4 Irregular periods Maternal age 43-20 Para 2  Review of Systems [name] W[place] RMA; 12/01/2014 7:46 AM) General Present- Fatigue. Not Present- Appetite Loss, Chills, Fever, Night Sweats, Weight Gain and Weight Loss. Skin Present- Dryness and Rash. Not Present- Change in Wart/Mole, Hives, Jaundice, New Lesions, Non-Healing Wounds and Ulcer. HEENT Present- Hoarseness, Oral Ulcers, Ringing in the Ears, Seasonal Allergies, Sinus Pain and Wears glasses/contact lenses. Not Present- Earache, Hearing Loss, Nose Bleed, Sore Throat, Visual Disturbances and Yellow Eyes. Respiratory Present- Snoring. Not Present- Bloody sputum, Chronic Cough, Difficulty Breathing and Wheezing. Breast Present- Breast Pain. Not Present- Breast Mass, Nipple Discharge and Skin Changes. Cardiovascular Present- Palpitations, Rapid Heart Rate and Shortness of Breath. Not Present- Chest Pain, Difficulty Breathing Lying Down, Leg Cramps and Swelling of Extremities. Gastrointestinal Present- Bloating and  Constipation. Not Present- Abdominal Pain, Bloody Stool, Change in Bowel Habits, Chronic diarrhea, Difficulty Swallowing, Excessive gas, Gets full quickly at meals, Hemorrhoids, Indigestion, Nausea, Rectal Pain and Vomiting. Female Genitourinary Present- Nocturia and Urgency. Not Present- Frequency, Painful Urination and Pelvic Pain. Musculoskeletal Present- Back Pain,  Joint Pain and Joint Stiffness. Not Present- Muscle Pain, Muscle Weakness and Swelling of Extremities. Neurological Present- Numbness. Not Present- Decreased Memory, Fainting, Headaches, Seizures, Tingling, Tremor, Trouble walking and Weakness. Psychiatric Present- Anxiety and Fearful. Not Present- Bipolar, Change in Sleep Pattern, Depression and Frequent crying. Endocrine Present- Cold Intolerance, Hair Changes and Hot flashes. Not Present- Excessive Hunger, Heat Intolerance and New Diabetes.   Physical Exam Eddie Dibbles S. [name] MD; 12/01/2014 12:13 PM) General Mental Status-Alert. General Appearance-Consistent with stated age. Hydration-Well hydrated. Voice-Normal.  Head and Neck Head-normocephalic, atraumatic with no lesions or palpable masses. Trachea-midline. Thyroid Gland Characteristics - normal size and consistency.  Eye Eyeball - Bilateral-Extraocular movements intact. Sclera/Conjunctiva - Bilateral-No scleral icterus.  Chest and Lung Exam Chest and lung exam reveals -quiet, even and easy respiratory effort with no use of accessory muscles and on auscultation, normal breath sounds, no adventitious sounds and normal vocal resonance. Inspection Chest Wall - Normal. Back - normal.  Breast Note: There is no palpable mass in either breast. There is no palpable axillary, supraclavicular, or cervical lymphadenopathy. She does have a palpable pacemaker of the left upper outer chest wall   Cardiovascular Cardiovascular examination reveals -normal heart sounds, regular rate and rhythm with no murmurs and  normal pedal pulses bilaterally.  Abdomen Inspection Inspection of the abdomen reveals - No Hernias. Skin - Scar - no surgical scars. Palpation/Percussion Palpation and Percussion of the abdomen reveal - Soft, Non Tender, No Rebound tenderness, No Rigidity (guarding) and No hepatosplenomegaly. Auscultation Auscultation of the abdomen reveals - Bowel sounds normal.  Neurologic Neurologic evaluation reveals -alert and oriented x 3 with no impairment of recent or remote memory. Mental Status-Normal.  Musculoskeletal Normal Exam - Left-Upper Extremity Strength Normal and Lower Extremity Strength Normal. Normal Exam - Right-Upper Extremity Strength Normal and Lower Extremity Strength Normal.  Lymphatic Head & Neck  General Head & Neck Lymphatics: Bilateral - Description - Normal. Axillary  General Axillary Region: Bilateral - Description - Normal. Tenderness - Non Tender. Femoral & Inguinal  Generalized Femoral & Inguinal Lymphatics: Bilateral - Description - Normal. Tenderness - Non Tender.    Assessment & Plan Eddie Dibbles S. [name] MD; 12/01/2014 12:15 PM) DCIS (DUCTAL CARCINOMA IN SITU), RIGHT (233.0  D05.11) Impression: The patient appears to have a small area of DCIS in the upper outer quadrant of the right breast. I have talked her in detail about the different options for treatment and at this point she favors breast conservation. I think this is a very reasonable way of treating her breast cancer. She would require a right breast radioactive seed localized lumpectomy. I have discussed with her in detail the risks and benefits of the operation to do this as well as some of the technical aspects and she understands and wishes to proceed. Given her cardiac history I will plan to get cardiac clearance from her cardiologist, Dr. Caryl Comes, prior to scheduling.     Signed by Luella Cook, MD (12/01/2014 12:15 PM)

## 2015-01-03 NOTE — Telephone Encounter (Signed)
Pt returned call - [phone]

## 2015-01-03 NOTE — Anesthesia Postprocedure Evaluation (Signed)
  Anesthesia Post-op Note  Patient: [name]  Procedure(s) Performed: Procedure(s): BREAST LUMPECTOMY WITH RADIOACTIVE SEED LOCALIZATION (Right)  Patient Location: PACU  Anesthesia Type:General  Level of Consciousness: awake and alert   Airway and Oxygen Therapy: Patient Spontanous Breathing  Post-op Pain: mild  Post-op Assessment: Post-op Vital signs reviewed  Post-op Vital Signs: Reviewed  Last Vitals:  Filed Vitals:   01/03/15 0943  BP: 117/63  Pulse: 64  Temp:   Resp: 16    Complications: No apparent anesthesia complications

## 2015-01-03 NOTE — Telephone Encounter (Signed)
LMTCB for pt 

## 2015-01-03 NOTE — Anesthesia Procedure Notes (Addendum)
Procedure Name: LMA Insertion Date/Time: 01/03/2015 7:36 AM Performed by: Susa Loffler Pre-anesthesia Checklist: Patient identified, Timeout performed, Emergency Drugs available, Suction available and Patient being monitored Patient Re-evaluated:Patient Re-evaluated prior to inductionOxygen Delivery Method: Circle system utilized Preoxygenation: Pre-oxygenation with 100% oxygen Intubation Type: IV induction LMA: LMA inserted LMA Size: 4.0 Number of attempts: 1 Placement Confirmation: positive ETCO2 and breath sounds checked- equal and bilateral Tube secured with: Tape Dental Injury: Teeth and Oropharynx as per pre-operative assessment

## 2015-01-03 NOTE — Interval H&P Note (Signed)
History and Physical Interval Note:  01/03/2015 7:11 AM  [name]  has presented today for surgery, with the diagnosis of Right Breast DCIS  The various methods of treatment have been discussed with the patient and family. After consideration of risks, benefits and other options for treatment, the patient has consented to  Procedure(s): BREAST LUMPECTOMY WITH RADIOACTIVE SEED LOCALIZATION (Right) as a surgical intervention .  The patient's history has been reviewed, patient examined, no change in status, stable for surgery.  I have reviewed the patient's chart and labs.  Questions were answered to the patient's satisfaction.     TOTH III,Kamrin Sibley S

## 2015-01-03 NOTE — Telephone Encounter (Signed)
lmtcb x1 for pt. 

## 2015-01-03 NOTE — Progress Notes (Signed)
Notified [name] from [employer] of procedure may interfere with device function, and magnet should be placed  and post-op interrogation not needed per cardiac device sheet received from Dr. Caryl Comes office. Given surgery time and she stated [name], would be available and if needed call toll free number to get in touch with him.

## 2015-01-03 NOTE — Anesthesia Preprocedure Evaluation (Addendum)
Anesthesia Evaluation  Patient identified by MRN, date of birth, ID band Patient awake    Reviewed: Allergy & Precautions, NPO status , Patient's Chart, lab work & pertinent test results, reviewed documented beta blocker date and time   Airway Mallampati: II  TM Distance: >3 FB Neck ROM: Full    Dental   Pulmonary sleep apnea , former smoker,  breath sounds clear to auscultation        Cardiovascular hypertension, Pt. on medications and Pt. on home beta blockers + Peripheral Vascular Disease + Cardiac Defibrillator Rhythm:Regular Rate:Normal  Echo 12/07/2014:  - Left ventricle: The cavity size was normal. Wall thickness was normal. Systolic function was normal. The estimated ejection fraction was in the range of 50% to 55%. Wall motion was normal; there were no regional wall motion abnormalities. Doppler parameters are consistent with abnormal left ventricular relaxation (grade 1 diastolic dysfunction). - Pulmonary arteries: Systolic pressure was mildly increased. PA peak pressure: 36 mm Hg (S). Impressions:- Low normal LV function; EF 82-99; grade 1 diastolic dysfunction;trace MR and TR; mildly elevated pulmonary pressure.    Neuro/Psych Anxiety Depression CVA    GI/Hepatic Neg liver ROS, hiatal hernia, PUD, GERD-  ,  Endo/Other  Hypothyroidism Morbid obesity  Renal/GU negative Renal ROS     Musculoskeletal  (+) Arthritis -,   Abdominal   Peds  Hematology negative hematology ROS (+)   Anesthesia Other Findings   Reproductive/Obstetrics                            Anesthesia Physical Anesthesia Plan  ASA: III  Anesthesia Plan: General   Post-op Pain Management:    Induction: Intravenous  Airway Management Planned: LMA  Additional Equipment:   Intra-op Plan:   Post-operative Plan:   Informed Consent: I have reviewed the patients History and Physical, chart, labs and discussed the  procedure including the risks, benefits and alternatives for the proposed anesthesia with the patient or authorized representative who has indicated his/her understanding and acceptance.   Dental advisory given  Plan Discussed with: CRNA  Anesthesia Plan Comments:         Anesthesia Quick Evaluation

## 2015-01-04 NOTE — Telephone Encounter (Signed)
Called and spoke to pt. Informed her of the results and recs per [facility]. Pt verbalized understanding and denied any further questions or concerns at this time.

## 2015-01-05 ENCOUNTER — Encounter: Payer: Self-pay | Admitting: Pulmonary Disease

## 2015-01-05 ENCOUNTER — Ambulatory Visit (INDEPENDENT_AMBULATORY_CARE_PROVIDER_SITE_OTHER): Payer: Medicare Other | Admitting: Pulmonary Disease

## 2015-01-05 VITALS — BP 118/64 | HR 67 | Temp 97.8°F | Ht 66.5 in | Wt 206.4 lb

## 2015-01-05 DIAGNOSIS — G2581 Restless legs syndrome: Secondary | ICD-10-CM | POA: Diagnosis not present

## 2015-01-05 DIAGNOSIS — G4733 Obstructive sleep apnea (adult) (pediatric): Secondary | ICD-10-CM

## 2015-01-05 NOTE — Assessment & Plan Note (Signed)
The patient is wearing C Pap compliantly, and thinks that it has indeed helped her sleep and energy level during the day. She would like to continue with it for at least another 3-6 months and see how she responds. I have asked her to keep up with her mask changes and supplies, and to follow-up with [place] again in 6 months.

## 2015-01-05 NOTE — Patient Instructions (Signed)
Stay on your cpap, and will increase your auto pressure just a bit to better cover your sleep apnea. Let us know if you would like to try a medication for your restless leg symptoms.  Keep up with mask changes and supplies. followup with in 78mos with Dr. Halford Chessman.

## 2015-01-05 NOTE — Assessment & Plan Note (Signed)
The patient has decided not to take a dopamine agonist for her symptoms, but will let us know if she changes her mind.

## 2015-01-05 NOTE — Progress Notes (Signed)
   Subjective:    Patient ID: [name], female    DOB: Mar 12, 1951, 64 y.o.   MRN: [id number]  HPI The patient comes in today for follow-up of her mild obstructive sleep apnea, and RLS. She had decided to try C Pap to see if this would help her sleep and daytime symptoms, and she feels that it has. Her download shows good compliance, but she is only averaging 4-5 hours a night with the device. She does not have significant mask leak, but does have some mild breakthrough on her AHI. Even though it is taking some getting used to, she feels that it has helped her sleep and daytime alertness, and wishes to continue.   Review of Systems  Constitutional: Negative for fever and unexpected weight change.  HENT: Negative for congestion, dental problem, ear pain, nosebleeds, postnasal drip, rhinorrhea, sinus pressure, sneezing, sore throat and trouble swallowing.   Eyes: Negative for redness and itching.  Respiratory: Negative for cough, chest tightness, shortness of breath and wheezing.   Cardiovascular: Negative for palpitations and leg swelling.  Gastrointestinal: Negative for nausea and vomiting.  Genitourinary: Negative for dysuria.  Musculoskeletal: Negative for joint swelling.  Skin: Negative for rash.  Neurological: Negative for headaches.  Hematological: Does not bruise/bleed easily.  Psychiatric/Behavioral: Negative for dysphoric mood. The patient is not nervous/anxious.        Objective:   Physical Exam Well-developed female in no acute distress Nose without purulence or discharge noted Neck without lymphadenopathy or thyromegaly No skin breakdown or pressure necrosis from the C Pap mask Lower extremities with mild edema, no cyanosis Alert and oriented, does not appear to be sleepy, moves all 4 extremities.       Assessment & Plan:

## 2015-01-06 ENCOUNTER — Other Ambulatory Visit: Payer: Self-pay

## 2015-01-06 ENCOUNTER — Encounter: Payer: Self-pay | Admitting: *Deleted

## 2015-01-06 MED ORDER — ZOLPIDEM TARTRATE 10 MG PO TABS: 10.0000 mg | ORAL_TABLET | Freq: Every evening | ORAL | Status: DC | PRN

## 2015-01-06 NOTE — Telephone Encounter (Signed)
Px written for call in   

## 2015-01-06 NOTE — Telephone Encounter (Signed)
Pt left v/m requesting refill zolpidem to [address]. Zolpidem last refilled on 07/22/14 #90 x 1 refill. Last annual exam 06/25/14.Please advise.

## 2015-01-06 NOTE — Telephone Encounter (Signed)
Rx called in as prescribed 

## 2015-01-18 NOTE — Progress Notes (Signed)
Location of Breast Cancer:Right Breast dcis  Histology per Pathology Report: 01/03/2015 Diagnosis Breast, lumpectomy, right - DUCTAL CARCINOMA IN SITU WITH CALCIFICATIONS, HIGH GRADE, SPANNING 1.8 CM. - DUCTAL CARCINOMA IN SITU IS FOCALLY 0.1 CM TO THE MEDIAL MARGIN.  Receptor Status: ER(), PR (), Her2-neu ()  Did patient present with symptoms (if so, please note symptoms) or was this found on screening mammography?:Found on screening.  Past/Anticipated interventions by surgeon, if MJI:IKWEF breast lumpectomy with radioactive seed on 01/03/15.  Past/Anticipated interventions by medical oncology, if any: Chemotherapy Genetic testing negative  Lymphedema issues, if any No  Pain issues, if CSR:RMUUHCCEQ knee pain  SAFETY ISSUES:  Prior radiation?yes in 2000 keloid prevention of hysterectomy incision.  Pacemaker/ICD? Yes placed by Dr.Klein  Possible current pregnancy?Post-menopausal  Is the patient on methotrexate? No  Current Complaints / other details:DivorcedG4 P2, first birth age 44.menarche age 33.  2 aunts with breast cancer.  Former smoker quit in January 2011. Allergies;    Arlyss Repress, RN 01/18/2015,5:25 PM

## 2015-01-19 ENCOUNTER — Ambulatory Visit
Admission: RE | Admit: 2015-01-19 | Discharge: 2015-01-19 | Disposition: A | Discharge status: Home / Self Care | Payer: Medicare Other | Source: Ambulatory Visit | Attending: Radiation Oncology | Admitting: Radiation Oncology

## 2015-01-19 ENCOUNTER — Encounter: Payer: Self-pay | Admitting: Radiation Oncology

## 2015-01-19 VITALS — BP 113/59 | HR 63 | Temp 98.0°F | Wt 206.6 lb

## 2015-01-19 DIAGNOSIS — C50411 Malignant neoplasm of upper-outer quadrant of right female breast: Secondary | ICD-10-CM

## 2015-01-19 DIAGNOSIS — Z510 Encounter for antineoplastic radiation therapy: Secondary | ICD-10-CM | POA: Insufficient documentation

## 2015-01-19 NOTE — Progress Notes (Signed)
  Radiation Oncology         [phone] ________________________________  Initial outpatient Consultation - Date: 01/19/2015   Name: [name] MRN: [id number]   DOB: 1951/08/10  REFERRING PHYSICIAN: Autumn Messing III, MD  DIAGNOSIS AND STAGE: Breast cancer of upper-outer quadrant of right female breast   Staging form: Breast, AJCC 7th Edition     Clinical stage from 12/01/2014: Stage 0 (Tis (DCIS), N0, M0) - Unsigned   HISTORY OF PRESENT ILLNESS::[name] is a 64 y.o. female.  She had an lumpectomy on 01/03/15, this showed a 1.8 cm area of ductal carcinoma in situ. She had a focally close medial margin. She also had genetic testing which was negative. She has an appointment with medical oncology tomorrow. She is healing well after surgery. She is worried about keloid formation at her incision.  Past medical, social and family history were reviewed in the electronic chart. Review of symptoms was reviewed in the electronic chart. Medications were reviewed in the electronic chart.   PHYSICAL EXAM:  Filed Vitals:   01/19/15 1024  BP: 113/59  Pulse: 63  Temp: 98 F (36.7 C)  .206 lb 9.6 oz (93.713 kg). Healing   IMPRESSION: DCIS of Right Breast  PLAN:I spoke to the patient today regarding her diagnosis and options for treatment. We discussed the equivalence in terms of survival and local failure between mastectomy and breast conservation. We discussed the role of radiation in decreasing local failures in patients who undergo lumpectomy. We discussed the process of simulation and the placement tattoos. We discussed 4-6 weeks of treatment as an outpatient. We discussed the possibility of asymptomatic lung damage. We discussed the low likelihood of secondary malignancies. We discussed the possible side effects including but not limited to skin redness, fatigue, permanent skin darkening, and breast swelling.   She will be scheduled for simulation next week.    I spent 60 minutes  face to  face with the patient and more than 50% of that time was spent in counseling and/or coordination of care.   ------------------------------------------------  Thea Silversmith, MD  This document serves as a record of services personally performed by Thea Silversmith, MD. It was created on her behalf by Derek Mound, a trained medical scribe. The creation of this record is based on the scribe's personal observations and the provider's statements to them. This document has been checked and approved by the attending provider.

## 2015-01-19 NOTE — Progress Notes (Signed)
Please see the Nurse Progress Note in the MD Initial Consult Encounter for this patient. 

## 2015-01-20 ENCOUNTER — Telehealth: Payer: Self-pay | Admitting: Hematology

## 2015-01-20 ENCOUNTER — Encounter: Payer: Self-pay | Admitting: Hematology

## 2015-01-20 ENCOUNTER — Ambulatory Visit (HOSPITAL_BASED_OUTPATIENT_CLINIC_OR_DEPARTMENT_OTHER): Payer: Medicare Other | Admitting: Hematology

## 2015-01-20 VITALS — BP 123/64 | HR 63 | Temp 97.8°F | Resp 18 | Ht 66.5 in | Wt 206.0 lb

## 2015-01-20 DIAGNOSIS — Z170 Estrogen receptor positive status [ER+]: Secondary | ICD-10-CM

## 2015-01-20 DIAGNOSIS — D0511 Intraductal carcinoma in situ of right breast: Secondary | ICD-10-CM

## 2015-01-20 DIAGNOSIS — C50411 Malignant neoplasm of upper-outer quadrant of right female breast: Secondary | ICD-10-CM

## 2015-01-20 NOTE — Telephone Encounter (Signed)
Gave and printed appt sched and avs fo rpt for June 2017....emailed gretchen

## 2015-01-20 NOTE — Progress Notes (Signed)
[facility] CONSULT NOTE  Patient Care Team: Abner Greenspan, MD as PCP - [place] III, MD as Consulting Physician (General Surgery) Truitt Merle, MD as Consulting Physician (Hematology) Thea Silversmith, MD as Consulting Physician (Radiation Oncology) Rockwell Germany, RN as Registered Nurse Mauro Kaufmann, RN as Registered Nurse Deboraha Sprang, MD as Consulting Physician (Cardiology) Holley Bouche, NP as Nurse Practitioner (Nurse Practitioner)  CHIEF COMPLAINTS/PURPOSE OF CONSULTATION:  Newly diagnosed right breast DCIS  HISTORY OF PRESENTING ILLNESS:  [name] 64 y.o. female is here because of recent diagnosis of right breast DCIS  The cancer was detected by screening mammogram on 10/28/2014. She underwent a diagnostic mammogram on the next day, which showed a group of microcalcifications in the upper outer quadrant of right breast, spanning 11 mm. The cancer was not palpable prior to diagnosis. She underwent ultrasound-guided needle biopsy on 11/19/2014 which showed high-grade DCIS with calcification and necrosis. ER/PR was 4-6% positive.  I reviewed her records extensively and collaborated the history with the patient.  She has been feeling fatigued for the past few month, but able to function well at home. Her granddaughter lives with her. She has both knee pain from arthritis, no knee surgery yet. She also has back pain, moderate, she take tramadol, hydrocordone for pains.   SUMMARY OF ONCOLOGIC HISTORY: Oncology History   Breast cancer of upper-outer quadrant of right female breast   Staging form: Breast, AJCC 7th Edition     Clinical stage from 12/01/2014: Stage 0 (Tis (DCIS), N0, M0) - Unsigned       Breast cancer of upper-outer quadrant of right female breast   10/29/2014 Mammogram Diagnostic mammogram showed a group of microcalcifications in the upper outer quadrant of right breast, spanning 11 mm   11/19/2014 Initial Diagnosis Breast DCIS of  upper-outer quadrant of right female breast   11/19/2014 Pathology Results High-grade DCIS with calcification and necrosis. ER 6% positive, PR 4%, positive, with weak staining intensity.   01/03/2015 Surgery right breast lumpectomy, negative surgical margins.   01/03/2015 Pathology Results DCIS with calcifications, high-grade, 1.8 cm.   01/03/2015 Receptors her2 ER 6% weekly positive, PR 4% weakly positive    In terms of breast cancer risk profile:  She menarched at early age of 36 and went to menopause at age 31 She had 3 pregnancy, her first child was born at age 29 She did no breast-fed her child  She received birth control pills for approximately 3-4 years  She was never exposed to fertility medications or hormone replacement therapy.  She has family history of Breast and colon cancer   INTERIM HISTORY Katrianna returns for follow-up. She underwent right lumpectomy by Dr. [name] on 01/03/2015. She did very well. She has no significant pain from the surgical site. She felt on her right side one week ago, and has some neck and shoulder pain. She denies any bruising or injury. No headaches or other neurological symptoms.  MEDICAL HISTORY:  Past Medical History  Diagnosis Date  . Paroxysmal ventricular tachycardia   . Ostium secundum type atrial septal defect   . Unspecified transient cerebral ischemia   . Takotsubo syndrome   . Unspecified vitamin D deficiency   . Other pulmonary embolism and infarction   . Other chronic nonalcoholic liver disease   . Cerebral aneurysm, nonruptured   . Hypothyroidism   . Hypertension   . Hyperlipidemia   . Esophageal reflux   . Diverticulosis of colon (without  mention of hemorrhage)   . Depressive disorder, not elsewhere classified   . Obstructive sleep apnea (adult) (pediatric)   . GI bleed   . PUD (peptic ulcer disease)   . Clostridium difficile infection   . Anxiety   . Arthritis   . 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 Sarcoid   . Presence of  permanent cardiac pacemaker   . AICD (automatic cardioverter/defibrillator) present   . Stroke 2013    tia  . History of hiatal hernia   . GERD (gastroesophageal reflux disease)   . Internal hemorrhoids   . Hiatal hernia     SURGICAL HISTORY: Past Surgical History  Procedure Laterality Date  . Partial hysterectomy      Fibroids  . Cerebral aneurysm repair  02/1999  . Knee arthroscopy      bilateral  . Cardiac defibrillator placement  2006; 2012    BSX single chamber ICD  . Carotid dopplers  2006    neg  . Hospitalization  2004    GI bleed, PUD, diverticulosis (EGD,colonscopy)  . Hospitalization      PE, NSVT, s/p defib  . Abi  2006    normal  . Left heart catheterization with coronary angiogram N/A 02/22/2012    Procedure: LEFT HEART CATHETERIZATION WITH CORONARY ANGIOGRAM;  Surgeon: Burnell Blanks, MD;  Location: [facility] CATH LAB;  Service: Cardiovascular;  Laterality: N/A;  . Abdominal hysterectomy    . Breast lumpectomy with radioactive seed localization Right 01/03/2015    Procedure: BREAST LUMPECTOMY WITH RADIOACTIVE SEED LOCALIZATION;  Surgeon: Autumn Messing III, MD;  Location: [facility];  Service: General;  Laterality: Right;    SOCIAL HISTORY: History   Social History  . Marital Status: Divorced    Spouse Name: N/A  . Number of Children: 2  . Years of Education: N/A   Occupational History  . DISABLED   .     Social History Main Topics  . Smoking status: Former Smoker -- 1.00 packs/day for 15 years    Types: Cigarettes    Quit date: 08/20/1989  . Smokeless tobacco: Never Used     Comment: Started at 25; less than 1 PPD  . Alcohol Use: 0.0 oz/week    0 Standard drinks or equivalent per week     Comment: rare  . Drug Use: No  . Sexual Activity: Not on file   Other Topics Concern  . Not on file   Social History Narrative   Retired from [employer]      2 children      Lives alone      Divorced    FAMILY HISTORY: Family History  Problem Relation Age  of Onset  . Diabetes Mother   . Alzheimer's disease Mother   . Cardiomyopathy      Family history  . Other Brother     Thyroid problem  . Allergies Sister   . Heart disease Sister   . Heart disease Brother   . Prostate cancer Brother 71    same brother as throat cancer  . Throat cancer Brother     dx in his 31s; also a smoker  . Heart disease Father   . Cancer Maternal Grandmother 36    colon cancer or abdominal cancer  . Lung cancer Maternal Grandfather 57  . Breast cancer Maternal Aunt 49  . Colon cancer Maternal Aunt 35    same sister as breast at 50  . Lung cancer Maternal Uncle   .  Breast cancer Maternal Aunt     dx in her 68s  . Pancreatic cancer Cousin 44    maternal first cousin  . Breast cancer Cousin     paternal first cousin twice removed died in her 26s    ALLERGIES:  is allergic to ace inhibitors; amitriptyline hcl; atorvastatin; cetirizine hcl; codeine; paroxetine; ramipril; rosuvastatin; carbamazepine; and dilantin.  MEDICATIONS:  Current Outpatient Prescriptions  Medication Sig Dispense Refill  . acetaminophen (TYLENOL) 500 MG tablet Take 500 mg by mouth every 6 (six) hours as needed (pain).     [name] aspirin 81 MG tablet Take 81 mg by mouth daily.      . carvedilol (COREG) 12.5 MG tablet Take 1 tablet (12.5 mg total) by mouth 2 (two) times daily with a meal. 180 tablet 1  . cholecalciferol (VITAMIN D) 1000 UNITS tablet Take 2,000 Units by mouth daily.    . clobetasol cream (TEMOVATE) 0.05 % Apply topically 2 (two) times daily. For dermatitis  2  . fexofenadine (ALLEGRA) 180 MG tablet Take 180 mg by mouth daily.    . fluticasone (FLONASE) 50 MCG/ACT nasal spray Place 2 sprays into both nostrils daily as needed for allergies or rhinitis. 48 g 1  . hydrochlorothiazide (HYDRODIURIL) 25 MG tablet Take 1 tablet (25 mg total) by mouth daily. 30 tablet 4  . HYDROcodone-acetaminophen (NORCO/VICODIN) 5-325 MG per tablet Take 1 tablet by mouth 3 (three) times daily as  needed for moderate pain.   0  . latanoprost (XALATAN) 0.005 % ophthalmic solution Place 1 drop into both eyes at bedtime.    [name] levothyroxine (SYNTHROID) 50 MCG tablet Take 1 tablet (50 mcg total) by mouth daily. 90 tablet 3  . Multiple Vitamins-Minerals (MULTIVITAMIN PO) Take 1 tablet by mouth daily.    . pantoprazole (PROTONIX) 40 MG tablet TAKE 1 TABLET (40 MG TOTAL) BY MOUTH DAILY. 30 tablet 1  . polyethylene glycol powder (GLYCOLAX/MIRALAX) powder TAKE 17 G BY MOUTH DAILY. 255 g 3  . potassium chloride (KLOR-CON M10) 10 MEQ tablet TAKE 1/2 (5 MEQ TOTAL)     TABLET DAILY 45 tablet 3  . simvastatin (ZOCOR) 40 MG tablet TAKE 1/2 TABLET DAILY 45 tablet 1  . traMADol (ULTRAM) 50 MG tablet Take 50 mg by mouth every 8 (eight) hours as needed (pain). For pain    . zolpidem (AMBIEN) 10 MG tablet Take 1 tablet (10 mg total) by mouth at bedtime as needed. for sleep 90 tablet 1  . [DISCONTINUED] potassium chloride (K-DUR) 10 MEQ tablet Take 0.5 tablets (5 mEq total) by mouth daily. 1/2 tab po qd 45 tablet 1   No current facility-administered medications for this visit.    REVIEW OF SYSTEMS:   Constitutional: Denies fevers, chills or abnormal night sweats Eyes: Denies blurriness of vision, double vision or watery eyes Ears, nose, mouth, throat, and face: Denies mucositis or sore throat Respiratory: Denies cough, dyspnea or wheezes Cardiovascular: Denies palpitation, chest discomfort or lower extremity swelling Gastrointestinal:  Denies nausea, heartburn or change in bowel habits Skin: Denies abnormal skin rashes Lymphatics: Denies new lymphadenopathy or easy bruising Neurological:Denies numbness, tingling or new weaknesses Behavioral/Psych: Mood is stable, no new changes  All other systems were reviewed with the patient and are negative.  PHYSICAL EXAMINATION: ECOG PERFORMANCE STATUS: 1 - Symptomatic but completely ambulatory  Filed Vitals:   01/20/15 1344  BP: 123/64  Pulse: 63  Temp:  97.8 F (36.6 C)  Resp: 18   Filed Weights   01/20/15  1344  Weight: 206 lb (93.441 kg)    GENERAL:alert, no distress and comfortable SKIN: skin color, texture, turgor are normal, no rashes or significant lesions EYES: normal, conjunctiva are pink and non-injected, sclera clear OROPHARYNX:no exudate, no erythema and lips, buccal mucosa, and tongue normal  NECK: supple, thyroid normal size, non-tender, without nodularity LYMPH:  no palpable lymphadenopathy in the cervical, axillary or inguinal LUNGS: clear to auscultation and percussion with normal breathing effort HEART: regular rate & rhythm and no murmurs and no lower extremity edema ABDOMEN:abdomen soft, non-tender and normal bowel sounds Musculoskeletal:no cyanosis of digits and no clubbing  PSYCH: alert & oriented x 3 with fluent speech NEURO: no focal motor/sensory deficits Breasts: Breast inspection showed them to be symmetrical with no nipple discharge or skin changes. Palpation of the right breasts showed a 1.5cm round mass at the upper outer quadrant, left breast and axilla exam revealed no obvious mass or node that I could appreciate.   LABORATORY DATA:  I have reviewed the data as listed Lab Results  Component Value Date   WBC 4.1 12/29/2014   HGB 12.6 12/29/2014   HCT 39.7 12/29/2014   MCV 88.2 12/29/2014   PLT 215 12/29/2014   Lab Results  Component Value Date   NA 139 12/29/2014   K 3.5 12/29/2014   CL 104 12/29/2014   CO2 29 12/29/2014   Pathology report Diagnosis 01/03/2015 Breast, lumpectomy, right - DUCTAL CARCINOMA IN SITU WITH CALCIFICATIONS, HIGH GRADE, SPANNING 1.8 CM. - DUCTAL CARCINOMA IN SITU IS FOCALLY 0.1 CM TO THE MEDIAL MARGIN. - SEE ONCOLOGY TABLE BELOW. Microscopic Comment BREAST, IN SITU CARCINOMA Specimen, including laterality: Right breast. Procedure (include lymph node sampling sentinel-non-sentinel): Lumpectomy. Grade of carcinoma: High grade. Necrosis: Present. Estimated tumor  size: (gross measurement): 1.8 cm Treatment effect: N/A Distance to closest margin: Focally 0.1 cm to the medial margin. Breast prognostic profile: [phone] Estrogen receptor: 6%, weak staining intensity. Progesterone receptor: 4%, weak staining intensity. Lymph nodes: Not examined. TNM: pTis, pNX Comments: In addition to the high grade ductal carcinoma in situ, there is fibrocystic changes with adenosis, usual ductal hyperplasia and calcifications, a fibroadenoma, vascular calcifications, and a healing biopsy site. (JBK:gt, 01/04/15)   RADIOGRAPHIC STUDIES: I have personally reviewed the radiological images as listed and agreed with the findings in the report.  Diagnostic mammogram 11/05/2014 FINDINGS: Spot compression magnification CC and ML views were performed over the slightly upper outer right breast demonstrating an indeterminate group of microcalcifications, some of which vary in shape and signs spanning a distance of approximately 11 mm.  IMPRESSION: Suspicious right breast calcifications.   Screening mammogram 10/29/2014 FINDINGS: In the right breast, calcifications warrant further evaluation with magnified views. In the left breast, no findings suspicious for malignancy. Images were processed with CAD.  IMPRESSION: Further evaluation is suggested for calcifications in the right breast.  ASSESSMENT:  64 year old postmenopausal woman with extensive past medical history of heart failure, status post ICD placement, sleep apnea and hypothyroidism, who was found to have right breast DCIS by screening mammogram.  PLAN:  #1 right breast high grade DCIS, ER/PR weakly positive (4-6%) -I discussed her surgical pathology results with patient in details. Her DCIS has been completely resected. -The patient has early stage disease. She is considered cured of disease by computed surgical resection alone  -Any form of adjuvant treatment is for prevention of disease  recurrence.  -Giving the very weak ER/PR expression of her tumor, and her extensive cardiac disease, I do not recommend  adjuvant endocrine therapy.  She was seen and by radiation oncologist Dr. Pablo Ledger tod yesterday and will start adjuvant radiation in 2 weeks.  We discussed the surveillance plan with yearly mammogram and physical exam. She will follow-up with me once a year.  # 2 Survivorship  -  I recommend her to follow-up with our survivorship after she completes radiation therapy. I'll schedule her to be seen in 3 months from now.  Follow-up,  FOLLOW UP: One year with exam only. I ordered her next mammogram in March 2017.  All questions were answered. The patient knows to call the clinic with any problems, questions or concerns. I spent 25 minutes counseling the patient face to face. The total time spent in the appointment was 30 minutes and more than 50% was on counseling.     Truitt Merle, MD 01/20/2015 2:47 PM

## 2015-01-21 ENCOUNTER — Telehealth: Payer: Self-pay

## 2015-01-21 NOTE — Telephone Encounter (Signed)
Pacemaker form faxed to device department on 01/19/15.

## 2015-01-25 ENCOUNTER — Ambulatory Visit
Admission: RE | Admit: 2015-01-25 | Discharge: 2015-01-25 | Disposition: A | Discharge status: Home / Self Care | Payer: Medicare Other | Source: Ambulatory Visit | Attending: Radiation Oncology | Admitting: Radiation Oncology

## 2015-01-25 DIAGNOSIS — C50411 Malignant neoplasm of upper-outer quadrant of right female breast: Secondary | ICD-10-CM

## 2015-01-25 NOTE — Progress Notes (Signed)
Electrophysiology Office Note Date: 01/26/2015  ID:  JADAH BOBAK, DOB [birth year], MRN [id number]  PCP: Loura Pardon, MD Electrophysiologist: Caryl Comes  CC: Follow up for palpitations  [name] is a 64 y.o. female is seen today for Dr Caryl Comes.  She was recently seen by Dr Caryl Comes for evaluation of palpitations and LE edema.  Her HCTZ was increased and echocardiogram was done which demonstrated normalization of LV function.  When I saw her last, her palpitations were somewhat improved on increased Coreg.  A 24 hour holter monitor was ordered to further evaluate palpitations. This demonstrated 3% PVC burden.  Since last being seen in our clinic, the patient reports doing very well. She denies chest pain, dyspnea, PND, orthopnea, nausea, vomiting, syncope.  She has not had ICD shocks.   She is scheduled to start left breast radiation next week for breast cancer.    Device History: BSX single chamber ICD implanted 2005 with generator change 2011 for non-ischemic cardiomyopathy and cardiac sarcoid History of appropriate therapy: No History of AAD therapy: No   Past Medical History  Diagnosis Date  . Paroxysmal ventricular tachycardia   . Ostium secundum type atrial septal defect   . Unspecified transient cerebral ischemia   . Takotsubo syndrome   . Unspecified vitamin D deficiency   . Other pulmonary embolism and infarction   . Other chronic nonalcoholic liver disease   . Cerebral aneurysm, nonruptured   . Hypothyroidism   . Hypertension   . Hyperlipidemia   . Esophageal reflux   . Diverticulosis of colon (without mention of hemorrhage)   . Depressive disorder, not elsewhere classified   . Obstructive sleep apnea (adult) (pediatric)   . GI bleed   . PUD (peptic ulcer disease)   . Clostridium difficile infection   . Anxiety   . Arthritis   . 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 Sarcoid   . Presence of permanent cardiac pacemaker   . AICD (automatic cardioverter/defibrillator)  present   . Stroke 2013    tia  . History of hiatal hernia   . GERD (gastroesophageal reflux disease)   . Internal hemorrhoids   . Hiatal hernia    Past Surgical History  Procedure Laterality Date  . Partial hysterectomy      Fibroids  . Cerebral aneurysm repair  02/1999  . Knee arthroscopy      bilateral  . Cardiac defibrillator placement  2006; 2012    BSX single chamber ICD  . Carotid dopplers  2006    neg  . Hospitalization  2004    GI bleed, PUD, diverticulosis (EGD,colonscopy)  . Hospitalization      PE, NSVT, s/p defib  . Abi  2006    normal  . Left heart catheterization with coronary angiogram N/A 02/22/2012    Procedure: LEFT HEART CATHETERIZATION WITH CORONARY ANGIOGRAM;  Surgeon: Burnell Blanks, MD;  Location: [facility] CATH LAB;  Service: Cardiovascular;  Laterality: N/A;  . Abdominal hysterectomy    . Breast lumpectomy with radioactive seed localization Right 01/03/2015    Procedure: BREAST LUMPECTOMY WITH RADIOACTIVE SEED LOCALIZATION;  Surgeon: Autumn Messing III, MD;  Location: [facility];  Service: General;  Laterality: Right;    Current Outpatient Prescriptions  Medication Sig Dispense Refill  . acetaminophen (TYLENOL) 500 MG tablet Take 500 mg by mouth every 6 (six) hours as needed (pain).     [name] aspirin 81 MG tablet Take 81 mg by mouth daily.      [name]  carvedilol (COREG) 12.5 MG tablet Take 1 tablet (12.5 mg total) by mouth 2 (two) times daily with a meal. 180 tablet 1  . cholecalciferol (VITAMIN D) 1000 UNITS tablet Take 2,000 Units by mouth daily.    . clobetasol cream (TEMOVATE) 0.05 % Apply topically 2 (two) times daily. For dermatitis  2  . fexofenadine (ALLEGRA) 180 MG tablet Take 180 mg by mouth daily.    . fluticasone (FLONASE) 50 MCG/ACT nasal spray Place 2 sprays into both nostrils daily as needed for allergies or rhinitis. 48 g 1  . hydrochlorothiazide (HYDRODIURIL) 25 MG tablet Take 1 tablet (25 mg total) by mouth daily. 30 tablet 4  .  HYDROcodone-acetaminophen (NORCO/VICODIN) 5-325 MG per tablet Take 1 tablet by mouth 3 (three) times daily as needed for moderate pain.   0  . latanoprost (XALATAN) 0.005 % ophthalmic solution Place 1 drop into both eyes at bedtime.    [name] levothyroxine (SYNTHROID) 50 MCG tablet Take 1 tablet (50 mcg total) by mouth daily. 90 tablet 3  . Multiple Vitamins-Minerals (MULTIVITAMIN PO) Take 1 tablet by mouth daily.    . pantoprazole (PROTONIX) 40 MG tablet TAKE 1 TABLET (40 MG TOTAL) BY MOUTH DAILY. 30 tablet 1  . polyethylene glycol powder (GLYCOLAX/MIRALAX) powder TAKE 17 G BY MOUTH DAILY. 255 g 3  . potassium chloride (KLOR-CON M10) 10 MEQ tablet TAKE 1/2 (5 MEQ TOTAL)     TABLET DAILY 45 tablet 3  . simvastatin (ZOCOR) 40 MG tablet TAKE 1/2 TABLET DAILY 45 tablet 1  . traMADol (ULTRAM) 50 MG tablet Take 50 mg by mouth every 8 (eight) hours as needed (pain). For pain    . zolpidem (AMBIEN) 10 MG tablet Take 1 tablet (10 mg total) by mouth at bedtime as needed. for sleep 90 tablet 1  . [DISCONTINUED] potassium chloride (K-DUR) 10 MEQ tablet Take 0.5 tablets (5 mEq total) by mouth daily. 1/2 tab po qd 45 tablet 1   No current facility-administered medications for this visit.    Allergies:   Ace inhibitors; Amitriptyline hcl; Atorvastatin; Cetirizine hcl; Codeine; Paroxetine; Ramipril; Rosuvastatin; Carbamazepine; and Dilantin   Social History: History   Social History  . Marital Status: Divorced    Spouse Name: N/A  . Number of Children: 2  . Years of Education: N/A   Occupational History  . DISABLED   .     Social History Main Topics  . Smoking status: Former Smoker -- 1.00 packs/day for 15 years    Types: Cigarettes    Quit date: 08/20/1989  . Smokeless tobacco: Never Used     Comment: Started at 30; less than 1 PPD  . Alcohol Use: 0.0 oz/week    0 Standard drinks or equivalent per week     Comment: rare  . Drug Use: No  . Sexual Activity: Not on file   Other Topics Concern  .  Not on file   Social History Narrative   Retired from [employer]      2 children      Lives alone      Divorced    Family History: Family History  Problem Relation Age of Onset  . Diabetes Mother   . Alzheimer's disease Mother   . Cardiomyopathy      Family history  . Other Brother     Thyroid problem  . Allergies Sister   . Heart disease Sister   . Heart disease Brother   . Prostate cancer Brother 40  same brother as throat cancer  . Throat cancer Brother     dx in his 21s; also a smoker  . Heart disease Father   . Cancer Maternal Grandmother 29    colon cancer or abdominal cancer  . Lung cancer Maternal Grandfather 66  . Breast cancer Maternal Aunt 69  . Colon cancer Maternal Aunt 62    same sister as breast at 49  . Lung cancer Maternal Uncle   . Breast cancer Maternal Aunt     dx in her 39s  . Pancreatic cancer Cousin 49    maternal first cousin  . Breast cancer Cousin     paternal first cousin twice removed died in her 53s    Review of Systems: All other systems reviewed and are otherwise negative except as noted above.   Physical Exam: VS:  BP 122/72 mmHg  Pulse 63  Ht 5' 6.6" (1.692 m)  Wt 204 lb (92.534 kg)  BMI 32.32 kg/m2  SpO2 97% , BMI Body mass index is 32.32 kg/(m^2).  GEN- The patient is well appearing, alert and oriented x 3 today.   HEENT: normocephalic, atraumatic; sclera clear, conjunctiva pink; hearing intact; oropharynx clear; neck supple, no JVP Lymph- no cervical lymphadenopathy Lungs- Clear to ausculation bilaterally, normal work of breathing.  No wheezes, rales, rhonchi Heart- Regular rate and rhythm, no murmurs, rubs or gallops  GI- soft, non-tender, non-distended, bowel sounds present Extremities- no clubbing, cyanosis, or edema; DP/PT/radial pulses 2+ bilaterally MS- no significant deformity or atrophy Skin- warm and dry, no rash or lesion; ICD pocket well healed Psych- euthymic mood, full affect Neuro- strength and  sensation are intact  EKG:  EKG is not ordered today.  Recent Labs: 06/18/2014: TSH 2.82 12/07/2014: Magnesium 2.1 12/29/2014: ALT 29; BUN 9; Creatinine, Ser 0.88; Hemoglobin 12.6; Platelets 215; Potassium 3.5; Sodium 139   Wt Readings from Last 3 Encounters:  01/26/15 204 lb (92.534 kg)  01/20/15 206 lb (93.441 kg)  01/19/15 206 lb 9.6 oz (93.713 kg)     Other studies Reviewed: Additional studies/ records that were reviewed today include: Dr Olin Pia office ntoes  Assessment and Plan:  1.  Palpitations Her palpitations are somewhat improved on increased dose of Coreg.   Holter monitor demonstrated 3% PVC burden Increase Coreg to 18.75mg  twice daily Patient reassured today  2.  HTN Stable No change required today  3.  Non ischemic cardiomyopathy EF normalized by recent echo Normal ICD function See Pace Art report No changes today   Current medicines are reviewed at length with the patient today.   The patient does not have concerns regarding her medicines.  The following changes were made today: Increase Coreg to 18.75mg  twice daily  Labs/ tests ordered today include: none   Disposition:   [place], follow up with Dr Caryl Comes in 6 months   Signed, Chanetta Marshall, NP 01/26/2015 1:30 PM  [place] [address] [phone] (office) [phone] (fax)

## 2015-01-25 NOTE — Progress Notes (Signed)
Name: [name]   MRN: [id number]  Date:  01/25/2015  DOB: 05/25/1951  Status:outpatient    DIAGNOSIS: Breast cancer.  CONSENT VERIFIED: yes   SET UP: Patient is setup supine   IMMOBILIZATION:  The following immobilization was used:Custom Moldable Pillow, breast board.   NARRATIVE: [name] was brought to the [facility].  Identity was confirmed.  All relevant records and images related to the planned course of therapy were reviewed.  Then, the patient was positioned in a stable reproducible clinical set-up for radiation therapy.  Wires were placed to delineate the clinical extent of breast tissue. A wire was placed on the scar as well.  CT images were obtained.  An isocenter was placed. Skin markings were placed.  The CT images were loaded into the planning software where the target and avoidance structures were contoured.  The radiation prescription was entered and confirmed. The patient was discharged in stable condition and tolerated simulation well.    TREATMENT PLANNING NOTE:  Treatment planning then occurred. I have requested : MLC's, isodose plan, basic dose calculation  I personally designed and supervised the construction of 3 medically necessary complex treatment devices for the protection of critical normal structures including the lungs and contralateral breast as well as the immobilization device which is necessary for set up certainty.   3D simulation occurred. I requested and analyzed a dose volume histogram of the heart, lungs and lumpectomy cavity.

## 2015-01-25 NOTE — Progress Notes (Signed)
Radiation Oncology         [phone] ________________________________  Name: [name]      MRN: [id number]          Date: 01/25/2015              DOB: 11-06-1950  Optical Surface Tracking Plan:  Since intensity modulated radiotherapy (IMRT) and 3D conformal radiation treatment methods are predicated on accurate and precise positioning for treatment, intrafraction motion monitoring is medically necessary to ensure accurate and safe treatment delivery.  The ability to quantify intrafraction motion without excessive ionizing radiation dose can only be performed with optical surface tracking. Accordingly, surface imaging offers the opportunity to obtain 3D measurements of patient position throughout IMRT and 3D treatments without excessive radiation exposure.  I am ordering optical surface tracking for this patient's upcoming course of radiotherapy. ________________________________ Signature   Reference:   Ursula Alert, J, et al. Surface imaging-based analysis of intrafraction motion for breast radiotherapy patients.Journal of [place], n. 6, nov. 2014. ISSN [id number].   Available at: <http://www.jacmp.org/index.php/jacmp/article/view/4957>.

## 2015-01-26 ENCOUNTER — Ambulatory Visit (INDEPENDENT_AMBULATORY_CARE_PROVIDER_SITE_OTHER): Payer: Medicare Other | Admitting: Nurse Practitioner

## 2015-01-26 ENCOUNTER — Encounter: Payer: Self-pay | Admitting: Nurse Practitioner

## 2015-01-26 VITALS — BP 122/72 | HR 63 | Ht 66.6 in | Wt 204.0 lb

## 2015-01-26 DIAGNOSIS — I10 Essential (primary) hypertension: Secondary | ICD-10-CM

## 2015-01-26 DIAGNOSIS — R002 Palpitations: Secondary | ICD-10-CM

## 2015-01-26 DIAGNOSIS — I472 Ventricular tachycardia, unspecified: Secondary | ICD-10-CM

## 2015-01-26 DIAGNOSIS — Z95810 Presence of automatic (implantable) cardiac defibrillator: Secondary | ICD-10-CM

## 2015-01-26 DIAGNOSIS — I428 Other cardiomyopathies: Secondary | ICD-10-CM

## 2015-01-26 DIAGNOSIS — I429 Cardiomyopathy, unspecified: Secondary | ICD-10-CM

## 2015-01-26 DIAGNOSIS — I4729 Other ventricular tachycardia: Secondary | ICD-10-CM

## 2015-01-26 MED ORDER — CARVEDILOL 12.5 MG PO TABS: 18.7500 mg | ORAL_TABLET | Freq: Two times a day (BID) | ORAL | Status: DC

## 2015-01-26 NOTE — Patient Instructions (Addendum)
Medication Instructions:   START TAKING 18.75 (A TABLET AND HALF) OF CARVEDILOL TWICE A DAY   Labwork:   Testing/Procedures:   Follow-Up:  Your physician wants you to follow-up in:  IN [place] will receive a reminder letter in the mail two months in advance. If you don't receive a letter, please call our office to schedule the follow-up appointment.  REMOTE TRANSMISSION  IN 3 MONTHS   Any Other Special Instructions Will Be Listed Below (If Applicable).

## 2015-01-27 ENCOUNTER — Telehealth: Payer: Self-pay

## 2015-01-27 NOTE — Telephone Encounter (Addendum)
Left vm that patient will start treatment on Thursday 6/16//16 at 1:30 and to call me back to clarify when he is needed to come.[phone]--[phone].pacemaker form completed and sent back from [place] to check defibillator post radiation treatments.

## 2015-01-28 ENCOUNTER — Other Ambulatory Visit: Payer: Self-pay | Admitting: *Deleted

## 2015-01-28 MED ORDER — HYDROCHLOROTHIAZIDE 25 MG PO TABS: 25.0000 mg | ORAL_TABLET | Freq: Every day | ORAL | Status: DC

## 2015-01-31 ENCOUNTER — Telehealth: Payer: Self-pay

## 2015-01-31 ENCOUNTER — Telehealth: Payer: Self-pay | Admitting: Pulmonary Disease

## 2015-01-31 DIAGNOSIS — G4733 Obstructive sleep apnea (adult) (pediatric): Secondary | ICD-10-CM

## 2015-01-31 NOTE — Telephone Encounter (Signed)
Patient says that she cannot use CPAP machine, she is pulling at it all night because it makes her mouth and nose itch.  She says that she feels like she is allergic to the mask.  Patient says she is a mouth breather, so the nasal cannula does not work for her either.  She said she wakes up every morning with a sore throat.  She says she wants [facility] to come pick up the machine.    Former [facility] pt - Dr. Halford Chessman - please advise if there are other options for this patient.  Thanks.

## 2015-01-31 NOTE — Telephone Encounter (Signed)
Confirmed with [facility] that Corene Cornea will be here on Thursday 02/03/15 to check defibrillator.Contact number [phone].

## 2015-02-01 ENCOUNTER — Encounter: Payer: Self-pay | Admitting: Internal Medicine

## 2015-02-01 ENCOUNTER — Ambulatory Visit: Payer: Medicare Other | Admitting: Internal Medicine

## 2015-02-01 ENCOUNTER — Ambulatory Visit (INDEPENDENT_AMBULATORY_CARE_PROVIDER_SITE_OTHER): Payer: Medicare Other | Admitting: Internal Medicine

## 2015-02-01 VITALS — BP 116/76 | Ht 66.5 in | Wt 204.2 lb

## 2015-02-01 DIAGNOSIS — K3189 Other diseases of stomach and duodenum: Secondary | ICD-10-CM

## 2015-02-01 DIAGNOSIS — K219 Gastro-esophageal reflux disease without esophagitis: Secondary | ICD-10-CM

## 2015-02-01 DIAGNOSIS — K5900 Constipation, unspecified: Secondary | ICD-10-CM

## 2015-02-01 DIAGNOSIS — Z860100 Personal history of colon polyps, unspecified: Secondary | ICD-10-CM

## 2015-02-01 DIAGNOSIS — K31A0 Gastric intestinal metaplasia, unspecified: Secondary | ICD-10-CM

## 2015-02-01 MED ORDER — RANITIDINE HCL 150 MG PO TABS: 150.0000 mg | ORAL_TABLET | Freq: Every day | ORAL | Status: DC | PRN

## 2015-02-01 MED ORDER — PANTOPRAZOLE SODIUM 40 MG PO TBEC: DELAYED_RELEASE_TABLET | ORAL | Status: DC

## 2015-02-01 MED ORDER — RANITIDINE HCL 150 MG PO TABS: 150.0000 mg | ORAL_TABLET | Freq: Two times a day (BID) | ORAL | Status: DC

## 2015-02-01 MED ORDER — POLYETHYLENE GLYCOL 3350 17 GM/SCOOP PO POWD: ORAL | Status: DC

## 2015-02-01 NOTE — Telephone Encounter (Signed)
She had mild sleep apnea on sleep study.  Okay to send order to have CPAP d/c'ed.

## 2015-02-01 NOTE — Patient Instructions (Addendum)
We have sent the following medications to your pharmacy for you to pick up at your convenience: Pantoprazole, Ranitidine and Miralax  Please follow up in 1 year

## 2015-02-01 NOTE — Telephone Encounter (Signed)
Order has been placed per VS. Pt is aware. Nothing further was needed. 

## 2015-02-02 ENCOUNTER — Ambulatory Visit
Admission: RE | Admit: 2015-02-02 | Discharge: 2015-02-02 | Disposition: A | Discharge status: Home / Self Care | Payer: Medicare Other | Source: Ambulatory Visit | Attending: Radiation Oncology | Admitting: Radiation Oncology

## 2015-02-02 NOTE — Progress Notes (Signed)
Subjective:    Patient ID: [name], female    DOB: 1950/10/17, 64 y.o.   MRN: [id number]  HPI [name] is a 64 yo female with PMH of adenomatous colon polyps, GERD, hiatal hernia, diverticulosis, history of C. difficile, history of V. tach status post ICD, and recent diagnosis of breast cancer status post lumpectomy about to begin radiation therapy who is seen for follow-up. She was last seen in May 2015. She reports she has been feeling well but under a great deal of stress since being diagnosed with breast cancer in situ. She had a lumpectomy and is about to begin radiation therapy. She has continued on pantoprazole 40 mg daily. This continues to work well for her reflux. She denies heartburn, dysphagia or odynophagia. Occasionally she will have breakthrough heartburn which is relieved with Zantac. She requests a prescription for Zantac. Denies dysphagia or odynophagia. Reports intermittent constipation she is using MiraLAX. If she uses it daily she gets diarrhea and so she is using a lower dose intermittently which she feels works well. She denies blood in her stool or melena. No abdominal pain. No early satiety. She has been fatigued but feels this is stress related having recently dealt with breast cancer.  EGD was performed on 11/20/2012 -- tortuous distal esophagus was normal Z line. Small hiatal hernia. Gastritis biopsied. Normal duodenum. 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 No Helicobacter or malignancy. Colonoscopy performed on 11/20/2012 -- severe diverticulosis throughout the colon and a 4 mm tubular adenoma, external hemorrhoids  Review of Systems As per HPI, otherwise negative  Current Medications, Allergies, Past Medical History, Past Surgical History, Family History and Social History were reviewed in [employer] [employer] record.      Objective:   Physical Exam BP 116/76 mmHg  Ht 5' 6.5" (1.689 m)  Wt 204 lb 4 oz (92.647  kg)  BMI 32.48 kg/m2 Constitutional: Well-developed and well-nourished. No distress. HEENT: Normocephalic and atraumatic. Conjunctivae are normal.  No scleral icterus. Neck: Neck supple. Trachea midline. Cardiovascular: Normal rate, regular rhythm and intact distal pulses. No M/R/G Pulmonary/chest: Effort normal and breath sounds normal. No wheezing, rales or rhonchi. Abdominal: Soft, nontender, nondistended. Bowel sounds active throughout.  Extremities: no clubbing, cyanosis, or edema Neurological: Alert and oriented to person place and time. Skin: Skin is warm and dry. No rashes noted. Psychiatric: Normal mood and affect. Behavior is normal.  CBC    Component Value Date/Time   WBC 4.1 12/29/2014 1300   WBC 3.9 12/01/2014 0845   WBC 3.3* 03/13/2007 1249   RBC 4.50 12/29/2014 1300   RBC 4.39 12/01/2014 0845   RBC 4.12 03/13/2007 1249   RBC 4.31 03/27/2006 1345   HGB 12.6 12/29/2014 1300   HGB 12.4 12/01/2014 0845   HGB 11.7 03/13/2007 1249   HCT 39.7 12/29/2014 1300   HCT 38.8 12/01/2014 0845   HCT 35.1 03/13/2007 1249   PLT 215 12/29/2014 1300   PLT 213 12/01/2014 0845   PLT 199 03/13/2007 1249   MCV 88.2 12/29/2014 1300   MCV 88.5 12/01/2014 0845   MCV 85 03/13/2007 1249   MCH 28.0 12/29/2014 1300   MCH 28.3 12/01/2014 0845   MCH 28.4 03/13/2007 1249   MCHC 31.7 12/29/2014 1300   MCHC 32.0 12/01/2014 0845   MCHC 33.3 03/13/2007 1249   RDW 12.4 12/29/2014 1300   RDW 12.7 12/01/2014 0845   RDW 12.7 03/13/2007 1249   LYMPHSABS 1.0 12/01/2014 0845   LYMPHSABS  1.1 06/18/2014 0900   LYMPHSABS 1.0 03/13/2007 1249   MONOABS 0.3 12/01/2014 0845   MONOABS 0.4 06/18/2014 0900   EOSABS 0.3 12/01/2014 0845   EOSABS 0.2 06/18/2014 0900   EOSABS 0.1 03/13/2007 1249   BASOSABS 0.0 12/01/2014 0845   BASOSABS 0.0 06/18/2014 0900   BASOSABS 0.0 03/13/2007 1249    CMP     Component Value Date/Time   NA 139 12/29/2014 1300   NA 141 12/01/2014 0847   K 3.5 12/29/2014 1300    K 3.8 12/01/2014 0847   CL 104 12/29/2014 1300   CO2 29 12/29/2014 1300   CO2 26 12/01/2014 0847   GLUCOSE 109* 12/29/2014 1300   GLUCOSE 175* 12/01/2014 0847   BUN 9 12/29/2014 1300   BUN 10.6 12/01/2014 0847   CREATININE 0.88 12/29/2014 1300   CREATININE 0.8 12/01/2014 0847   CALCIUM 9.9 12/29/2014 1300   CALCIUM 9.8 12/01/2014 0847   PROT 7.3 12/29/2014 1300   PROT 7.5 12/01/2014 0847   ALBUMIN 3.8 12/29/2014 1300   ALBUMIN 3.9 12/01/2014 0847   AST 33 12/29/2014 1300   AST 29 12/01/2014 0847   ALT 29 12/29/2014 1300   ALT 33 12/01/2014 0847   ALKPHOS 111 12/29/2014 1300   ALKPHOS 127 12/01/2014 0847   BILITOT 0.5 12/29/2014 1300   BILITOT 0.42 12/01/2014 0847   GFRNONAA >60 12/29/2014 1300   GFRAA >60 12/29/2014 1300       Assessment & Plan:  64 yo female with PMH of adenomatous colon polyps, GERD, hiatal hernia, diverticulosis, history of C. difficile, history of V. tach status post ICD, and recent diagnosis of breast cancer status post lumpectomy about to begin radiation therapy who is seen for follow-up.   1. GERD -- well-controlled on pantoprazole. Continue pantoprazole 40 mg daily  2. History of gastric metaplasia -- nondysplastic and no H. pylori seen. Repeat upper endoscopy discussed which she is more extensive than given recent breast cancer scare. We will plan repeat endoscopy in 2015 at the time of her colonoscopy  3. Constipation -- mild continue MiraLAX on an as-needed basis  4. History of tubular adenoma -- repeat colonoscopy recommended in 2019  5. Breast cancer -- in situ and status post lumpectomy felt to result in cure. Initiating radiation later this week  Follow-up in one year, sooner if needed 25 minutes spent with the patient today

## 2015-02-03 ENCOUNTER — Ambulatory Visit
Admission: RE | Admit: 2015-02-03 | Discharge: 2015-02-03 | Disposition: A | Discharge status: Home / Self Care | Payer: Medicare Other | Source: Ambulatory Visit | Attending: Radiation Oncology | Admitting: Radiation Oncology

## 2015-02-04 ENCOUNTER — Ambulatory Visit
Admission: RE | Admit: 2015-02-04 | Discharge: 2015-02-04 | Disposition: A | Discharge status: Home / Self Care | Payer: Medicare Other | Source: Ambulatory Visit | Attending: Radiation Oncology | Admitting: Radiation Oncology

## 2015-02-07 ENCOUNTER — Encounter: Payer: Self-pay | Admitting: Internal Medicine

## 2015-02-07 ENCOUNTER — Ambulatory Visit
Admission: RE | Admit: 2015-02-07 | Discharge: 2015-02-07 | Disposition: A | Discharge status: Home / Self Care | Payer: Medicare Other | Source: Ambulatory Visit | Attending: Radiation Oncology | Admitting: Radiation Oncology

## 2015-02-07 ENCOUNTER — Ambulatory Visit: Admission: RE | Admit: 2015-02-07 | Payer: Medicare Other | Source: Ambulatory Visit

## 2015-02-08 ENCOUNTER — Ambulatory Visit
Admission: RE | Admit: 2015-02-08 | Discharge: 2015-02-08 | Disposition: A | Discharge status: Home / Self Care | Payer: Medicare Other | Source: Ambulatory Visit | Attending: Radiation Oncology | Admitting: Radiation Oncology

## 2015-02-08 ENCOUNTER — Encounter: Payer: Self-pay | Admitting: Radiation Oncology

## 2015-02-08 VITALS — BP 111/46 | HR 63 | Temp 97.8°F | Ht 66.5 in | Wt 208.5 lb

## 2015-02-08 DIAGNOSIS — C50411 Malignant neoplasm of upper-outer quadrant of right female breast: Secondary | ICD-10-CM

## 2015-02-08 NOTE — Progress Notes (Signed)
Ms. Kennidy Lamke has received 4 fractions to her right breast.  No voiced concerns at this time.  Pt here for patient teaching.  Pt given Radiation and You booklet, skin care instructions, Alra deodorant and Radiaplex gel. Reviewed areas of pertinence such as fatigue, hair loss, skin changes, breast tenderness and breast swelling . Pt able to give teach back of to pat skin, use unscented/gentle soap and drink plenty of water,apply Radiaplex bid, avoid applying anything to skin within 4 hours of treatment, avoid wearing an under wire bra and to use an electric razor if they must shave. Pt demonstrated understanding and verbalizes understanding of information given and will contact nursing with any questions or concerns.    Teachback.

## 2015-02-08 NOTE — Progress Notes (Signed)
Weekly Management Note Current Dose: 8  Gy  Projected Dose: 50 Gy   Narrative:  The patient presents for routine under treatment assessment.  CBCT/MVCT images/Port film x-rays were reviewed.  The chart was checked. Feeling well. Worreid about friends comments that she doesn't need RT or that it is giving her cancer.   Physical Findings: Weight: 208 lb 8 oz (94.575 kg). Unchanged skin.   Impression:  The patient is tolerating radiation.  Plan:  Continue treatment as planned. Discussed radation and risk of malignancy. Encouraged her to bring friend with her in 2 weeks to discuss these issues.

## 2015-02-09 ENCOUNTER — Ambulatory Visit
Admission: RE | Admit: 2015-02-09 | Discharge: 2015-02-09 | Disposition: A | Discharge status: Home / Self Care | Payer: Medicare Other | Source: Ambulatory Visit | Attending: Radiation Oncology | Admitting: Radiation Oncology

## 2015-02-10 ENCOUNTER — Ambulatory Visit
Admission: RE | Admit: 2015-02-10 | Discharge: 2015-02-10 | Disposition: A | Discharge status: Home / Self Care | Payer: Medicare Other | Source: Ambulatory Visit | Attending: Radiation Oncology | Admitting: Radiation Oncology

## 2015-02-11 ENCOUNTER — Telehealth: Payer: Self-pay | Admitting: *Deleted

## 2015-02-11 ENCOUNTER — Ambulatory Visit
Admission: RE | Admit: 2015-02-11 | Discharge: 2015-02-11 | Disposition: A | Discharge status: Home / Self Care | Payer: Medicare Other | Source: Ambulatory Visit | Attending: Radiation Oncology | Admitting: Radiation Oncology

## 2015-02-11 NOTE — Telephone Encounter (Signed)
Spoke with patient to follow up during XRT.  She state she is doing fine. No questions or concerns at this time.  Encouraged to call with any needs or concerns.

## 2015-02-12 ENCOUNTER — Other Ambulatory Visit: Payer: Self-pay | Admitting: Internal Medicine

## 2015-02-14 ENCOUNTER — Ambulatory Visit
Admission: RE | Admit: 2015-02-14 | Discharge: 2015-02-14 | Disposition: A | Discharge status: Home / Self Care | Payer: Medicare Other | Source: Ambulatory Visit | Attending: Radiation Oncology | Admitting: Radiation Oncology

## 2015-02-15 ENCOUNTER — Encounter: Payer: Self-pay | Admitting: Radiation Oncology

## 2015-02-15 ENCOUNTER — Ambulatory Visit
Admission: RE | Admit: 2015-02-15 | Discharge: 2015-02-15 | Disposition: A | Discharge status: Home / Self Care | Payer: Medicare Other | Source: Ambulatory Visit | Attending: Radiation Oncology | Admitting: Radiation Oncology

## 2015-02-15 VITALS — BP 137/73 | HR 73 | Temp 97.7°F | Resp 16 | Wt 206.0 lb

## 2015-02-15 DIAGNOSIS — C50411 Malignant neoplasm of upper-outer quadrant of right female breast: Secondary | ICD-10-CM

## 2015-02-15 NOTE — Progress Notes (Signed)
  Radiation Oncology         [phone]   Name: [name] MRN: [id number]   Date: 02/15/2015  DOB: [birth year]   Weekly Radiation Therapy Management    ICD-9-CM ICD-10-CM   1. Breast cancer of upper-outer quadrant of right female breast 174.4 C50.411     Current Dose: 18 Gy  Planned Dose:  50 Gy  Narrative The patient presents for routine under treatment assessment. Using radiaplex bid, appetite good, energy level not good stated, Drinking plenty of water.  The patient is without complaint. Set-up films were reviewed. The chart was checked.  Physical Findings  weight is 206 lb (93.441 kg). Her oral temperature is 97.7 F (36.5 C). Her blood pressure is 137/73 and her pulse is 73. Her respiration is 16. . Weight essentially stable.  No significant changes., slight erythema, Dryness around nipple,   Impression The patient is tolerating radiation.  Plan Continue treatment as planned.     This document serves as a record of services personally performed by Tyler Pita, MD. It was created on his behalf by Arlyce Harman, a trained medical scribe. The creation of this record is based on the scribe's personal observations and the provider's statements to them. This document has been checked and approved by the attending provider.       [name], M.D.

## 2015-02-15 NOTE — Progress Notes (Addendum)
Weekly rad txs riight breat, slight erythema,  Dryness around nipple, radiaplex bid, appetite good, energy level not good stated,  Drinking plenty of water,    BP 137/73 mmHg  Pulse 73  Temp(Src) 97.7 F (36.5 C) (Oral)  Resp 16  Wt 206 lb (93.441 kg) Wt Readings from Last 3 Encounters:  02/08/15 208 lb 8 oz (94.575 kg)  02/01/15 204 lb 4 oz (92.647 kg)  01/26/15 204 lb (92.534 kg)  Gaspar Garbe, RN II Rad/Onc

## 2015-02-16 ENCOUNTER — Ambulatory Visit
Admission: RE | Admit: 2015-02-16 | Discharge: 2015-02-16 | Disposition: A | Discharge status: Home / Self Care | Payer: Medicare Other | Source: Ambulatory Visit | Attending: Radiation Oncology | Admitting: Radiation Oncology

## 2015-02-17 ENCOUNTER — Ambulatory Visit
Admission: RE | Admit: 2015-02-17 | Discharge: 2015-02-17 | Disposition: A | Discharge status: Home / Self Care | Payer: Medicare Other | Source: Ambulatory Visit | Attending: Radiation Oncology | Admitting: Radiation Oncology

## 2015-02-18 ENCOUNTER — Ambulatory Visit
Admission: RE | Admit: 2015-02-18 | Discharge: 2015-02-18 | Disposition: A | Discharge status: Home / Self Care | Payer: Medicare Other | Source: Ambulatory Visit | Attending: Radiation Oncology | Admitting: Radiation Oncology

## 2015-02-22 ENCOUNTER — Ambulatory Visit
Admission: RE | Admit: 2015-02-22 | Discharge: 2015-02-22 | Disposition: A | Discharge status: Home / Self Care | Payer: Medicare Other | Source: Ambulatory Visit | Attending: Radiation Oncology | Admitting: Radiation Oncology

## 2015-02-22 ENCOUNTER — Encounter: Payer: Medicare Other | Admitting: Internal Medicine

## 2015-02-22 VITALS — BP 126/47 | HR 60 | Temp 97.8°F | Wt 205.1 lb

## 2015-02-22 DIAGNOSIS — C50411 Malignant neoplasm of upper-outer quadrant of right female breast: Secondary | ICD-10-CM

## 2015-02-22 NOTE — Progress Notes (Signed)
Weekly assessment of radiation of radiation to right breast.Completed 13 of 25 treatments.Mild tanning of skin.Intermittent fatigue.Denies pain.

## 2015-02-22 NOTE — Progress Notes (Signed)
Weekly Management Note Current Dose:  26 Gy  Projected Dose: 50 Gy   Narrative:  The patient presents for routine under treatment assessment.  CBCT/MVCT images/Port film x-rays were reviewed.  The chart was checked. Doing well. No complaints. Mild fatigue.   Physical Findings: Weight: 205 lb 1.6 oz (93.033 kg). Slightly dark right breast.   Impression:  The patient is tolerating radiation.  Plan:  Continue treatment as planned. Continue radiaplex.

## 2015-02-23 ENCOUNTER — Ambulatory Visit
Admission: RE | Admit: 2015-02-23 | Discharge: 2015-02-23 | Disposition: A | Discharge status: Home / Self Care | Payer: Medicare Other | Source: Ambulatory Visit | Attending: Radiation Oncology | Admitting: Radiation Oncology

## 2015-02-24 ENCOUNTER — Ambulatory Visit
Admission: RE | Admit: 2015-02-24 | Discharge: 2015-02-24 | Disposition: A | Discharge status: Home / Self Care | Payer: Medicare Other | Source: Ambulatory Visit | Attending: Radiation Oncology | Admitting: Radiation Oncology

## 2015-02-25 ENCOUNTER — Ambulatory Visit
Admission: RE | Admit: 2015-02-25 | Discharge: 2015-02-25 | Disposition: A | Discharge status: Home / Self Care | Payer: Medicare Other | Source: Ambulatory Visit | Attending: Radiation Oncology | Admitting: Radiation Oncology

## 2015-02-28 ENCOUNTER — Ambulatory Visit
Admission: RE | Admit: 2015-02-28 | Discharge: 2015-02-28 | Disposition: A | Discharge status: Home / Self Care | Payer: Medicare Other | Source: Ambulatory Visit | Attending: Radiation Oncology | Admitting: Radiation Oncology

## 2015-03-01 ENCOUNTER — Ambulatory Visit
Admission: RE | Admit: 2015-03-01 | Discharge: 2015-03-01 | Disposition: A | Discharge status: Home / Self Care | Payer: Medicare Other | Source: Ambulatory Visit | Attending: Radiation Oncology | Admitting: Radiation Oncology

## 2015-03-01 VITALS — BP 124/57 | HR 66 | Wt 206.2 lb

## 2015-03-01 DIAGNOSIS — C50411 Malignant neoplasm of upper-outer quadrant of right female breast: Secondary | ICD-10-CM

## 2015-03-01 NOTE — Progress Notes (Signed)
Weekly Management Note Current Dose:  36 Gy  Projected Dose: 50 Gy   Narrative:  The patient presents for routine under treatment assessment.  CBCT/MVCT images/Port film x-rays were reviewed.  The chart was checked. Doing well. Some skin darkening.   Physical Findings: Weight: 206 lb 3.2 oz (93.532 kg). Dark right breast. No moist desquamation.   Impression:  The patient is tolerating radiation.  Plan:  Continue treatment as planned. Continue radiaplex.

## 2015-03-01 NOTE — Progress Notes (Signed)
Completed 18 of 25 fractions.Hyperpigmentation of skin with pronouced darkness of pores/moles.Continue radiaplex.Increased fatigue.States she has occassional heart flutter.heart rate is fine.told to check blood pressure as wel as heart rate at night and inform of status.

## 2015-03-02 ENCOUNTER — Ambulatory Visit
Admission: RE | Admit: 2015-03-02 | Discharge: 2015-03-02 | Disposition: A | Discharge status: Home / Self Care | Payer: Medicare Other | Source: Ambulatory Visit | Attending: Radiation Oncology | Admitting: Radiation Oncology

## 2015-03-03 ENCOUNTER — Ambulatory Visit
Admission: RE | Admit: 2015-03-03 | Discharge: 2015-03-03 | Disposition: A | Discharge status: Home / Self Care | Payer: Medicare Other | Source: Ambulatory Visit | Attending: Radiation Oncology | Admitting: Radiation Oncology

## 2015-03-04 ENCOUNTER — Ambulatory Visit
Admission: RE | Admit: 2015-03-04 | Discharge: 2015-03-04 | Disposition: A | Discharge status: Home / Self Care | Payer: Medicare Other | Source: Ambulatory Visit | Attending: Radiation Oncology | Admitting: Radiation Oncology

## 2015-03-07 ENCOUNTER — Ambulatory Visit
Admission: RE | Admit: 2015-03-07 | Discharge: 2015-03-07 | Disposition: A | Discharge status: Home / Self Care | Payer: Medicare Other | Source: Ambulatory Visit | Attending: Radiation Oncology | Admitting: Radiation Oncology

## 2015-03-08 ENCOUNTER — Ambulatory Visit
Admission: RE | Admit: 2015-03-08 | Discharge: 2015-03-08 | Disposition: A | Discharge status: Home / Self Care | Payer: Medicare Other | Source: Ambulatory Visit | Attending: Radiation Oncology | Admitting: Radiation Oncology

## 2015-03-08 ENCOUNTER — Ambulatory Visit: Payer: Medicare Other | Admitting: Radiation Oncology

## 2015-03-09 ENCOUNTER — Ambulatory Visit
Admission: RE | Admit: 2015-03-09 | Discharge: 2015-03-09 | Disposition: A | Discharge status: Home / Self Care | Payer: Medicare Other | Source: Ambulatory Visit | Attending: Radiation Oncology | Admitting: Radiation Oncology

## 2015-03-09 VITALS — BP 141/70 | HR 62 | Temp 97.8°F | Wt 205.3 lb

## 2015-03-09 DIAGNOSIS — C50411 Malignant neoplasm of upper-outer quadrant of right female breast: Secondary | ICD-10-CM

## 2015-03-09 NOTE — Progress Notes (Signed)
Weekly assessment of radiation to right breast.Completed 24 of 25 treatments.Hyperpigmentation of skin with discomfort of right axilla.Continue moisturizer 2 to 3 weeks then apply lotion with vitamin e.

## 2015-03-09 NOTE — Progress Notes (Signed)
Weekly Management Note Current Dose:  48 Gy Projected Dose: 50 Gy   Narrative:  The patient presents for routine under treatment assessment. Skin darkening has occurred at this time. All charts, CBCT/MVCT images, and Port film x-rays were checked and reviewed. Some pain in right axilla  Physical Findings: Weight: Dark right breast. No moist desquamation. Dry desquamation in the axilla  Impression: She is presenting to clinic in regards to her cancer of upper-outer quadrant of the right female breast. The patient is tolerating radiation.  Plan:  She was advised to continue treatment as planned and instructed to continue administering Radiaplex as needed. Follow up in 1 month.   This document serves as a record of services personally performed by Thea Silversmith , MD. It was created on her behalf by Lenn Cal, a trained medical scribe. The creation of this record is based on the scribe's personal observations and the provider's statements to them. This document has been checked and approved by the attending provider.   ------------------------------------------------  Thea Silversmith, MD

## 2015-03-10 ENCOUNTER — Encounter: Payer: Self-pay | Admitting: Radiation Oncology

## 2015-03-10 ENCOUNTER — Ambulatory Visit
Admission: RE | Admit: 2015-03-10 | Discharge: 2015-03-10 | Disposition: A | Discharge status: Home / Self Care | Payer: Medicare Other | Source: Ambulatory Visit | Attending: Radiation Oncology | Admitting: Radiation Oncology

## 2015-03-11 ENCOUNTER — Telehealth: Payer: Self-pay | Admitting: *Deleted

## 2015-03-11 NOTE — Telephone Encounter (Signed)
Spoke with patient to follow up post XRT.  She is doing well with no complaints. She is glad to have this completed.   Encouraged her to call with any needs or concerns.

## 2015-03-11 NOTE — Progress Notes (Signed)
Patient completed treatment.spoke with Corene Cornea of [facility] regarding ICD check post radiation and he stated to inform patient to press blue button when she gets home to confirm monitor check.patient will follow up in one month with Dr.Wentworth.

## 2015-03-15 ENCOUNTER — Ambulatory Visit: Payer: Medicare Other | Admitting: Internal Medicine

## 2015-03-15 NOTE — Progress Notes (Signed)
  Radiation Oncology         [phone] ________________________________  Name: [name] MRN: [id number]  Date: 03/10/2015  DOB: 03/28/1951  End of Treatment Note  Diagnosis:   Breast cancer of upper-outer quadrant of right female breast   Staging form: Breast, AJCC 7th Edition     Pathologic: Tis (DCIS) - Signed by Truitt Merle, MD on 01/20/2015     Indication for treatment: Curative    Radiation treatment dates:   02/03/2015-03/10/2015  Site/dose:    Right breast / 50 Gray @ 2 Pearline Cables per fraction x 25 fractions  Beams/energy:  Opposed Tangents / 6 MV photons  Narrative: The patient tolerated radiation treatment relatively well.     Plan: The patient has completed radiation treatment. The patient will return to radiation oncology clinic for routine followup in one month. I advised them to call or return sooner if they have any questions or concerns related to their recovery or treatment.  ------------------------------------------------  Thea Silversmith, MD

## 2015-03-31 ENCOUNTER — Other Ambulatory Visit: Payer: Self-pay | Admitting: Nurse Practitioner

## 2015-04-14 ENCOUNTER — Ambulatory Visit
Admission: RE | Admit: 2015-04-14 | Discharge: 2015-04-14 | Disposition: A | Discharge status: Home / Self Care | Payer: Medicare Other | Source: Ambulatory Visit | Attending: Radiation Oncology | Admitting: Radiation Oncology

## 2015-04-14 DIAGNOSIS — C50411 Malignant neoplasm of upper-outer quadrant of right female breast: Secondary | ICD-10-CM

## 2015-04-14 NOTE — Progress Notes (Signed)
   Department of Radiation Oncology  Phone:  [phone] Fax:        [phone]   Name: [name] MRN: [id number]  DOB: 03/02/1951  Date: 04/14/2015  Follow Up Visit Note  Diagnosis: Breast cancer of upper-outer quadrant of right female breast   Staging form: Breast, AJCC 7th Edition     Pathologic: Tis (DCIS) - Signed by Truitt Merle, MD on 01/20/2015  Summary and Interval since last radiation: 1 month. 02/03/2015-03/10/2015 Site/dose: Right breast / 50 Gray @ 2 Pearline Cables per fraction x 25 fractions  Interval History: [name] presents today for routine followup. She does not need anti-estrogen therapy. She is using vitamin E lotion on the treatment area. Reports she is feeling well.   Physical Exam:  There were no vitals filed for this visit. Mild hyperpigmentation over the right breast. Area of scaly skin measuring about 4 mm that looks just like an area of dry skin.  IMPRESSION: [name] is a 64 y.o. 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  PLAN: She is doing well. She will follow up with me in December.  She will see Dr. [name] in September and Dr. Burr Medico in June 2017. She will then follow up with Dr. [name] again in March 2017 with her annual mammogram that March as well. We discussed the need for sun protection in the treated area. She can always call me with questions. She will sign up for our survivorship program, Finding Your New [facility]).  This document serves as a record of services personally performed by Thea Silversmith, MD. It was created on her behalf by Darcus Austin, a trained medical scribe. The creation of this record is based on the scribe's personal observations and the provider's statements to them. This document has been checked and approved by the attending provider.   Thea Silversmith, MD

## 2015-04-15 ENCOUNTER — Telehealth: Payer: Self-pay | Admitting: Hematology

## 2015-04-15 ENCOUNTER — Other Ambulatory Visit: Payer: Self-pay | Admitting: Adult Health

## 2015-04-15 DIAGNOSIS — C50411 Malignant neoplasm of upper-outer quadrant of right female breast: Secondary | ICD-10-CM

## 2015-04-15 NOTE — Telephone Encounter (Signed)
s.w. pt and advised on sept appt.....pt ok and aware °

## 2015-04-28 ENCOUNTER — Encounter: Payer: Medicare Other | Admitting: *Deleted

## 2015-04-28 ENCOUNTER — Telehealth: Payer: Self-pay | Admitting: Cardiology

## 2015-04-28 NOTE — Telephone Encounter (Signed)
LMOVM reminding pt to send remote transmission.   

## 2015-04-29 ENCOUNTER — Encounter: Payer: Self-pay | Admitting: Cardiology

## 2015-05-10 ENCOUNTER — Ambulatory Visit (HOSPITAL_BASED_OUTPATIENT_CLINIC_OR_DEPARTMENT_OTHER): Payer: Medicare Other | Admitting: Nurse Practitioner

## 2015-05-10 ENCOUNTER — Encounter: Payer: Self-pay | Admitting: Nurse Practitioner

## 2015-05-10 VITALS — BP 119/57 | HR 69 | Temp 98.0°F | Resp 18 | Ht 66.5 in | Wt 206.8 lb

## 2015-05-10 DIAGNOSIS — C50411 Malignant neoplasm of upper-outer quadrant of right female breast: Secondary | ICD-10-CM

## 2015-05-10 NOTE — Progress Notes (Signed)
CLINIC:  Cancer Survivorship   REASON FOR VISIT:  Routine follow-up post-treatment for a recent history of breast cancer.  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     11/19/2014 Clinical Stage Stage 0: Tis N0   12/08/2014 Procedure Genetics: CancerNext no clinically sig. variant detected in APC, ATM, BARD1, BMPR1A, BRCA1/2, BRIP1, CDH1, CDK4, CDKN2A, CHEK2, EPCAM, GREM1, MLH1, MRE11A, MSH2/6, MUTYH, NBN, NF1, PALB2, PMS2, POLD1, POLE, PTEN, RAD50, RAD51D, SMAD4, SMARCA4, STK1, TP53   01/03/2015 Definitive Surgery Right lumpectomy [name]): DCIS with calcifications, high grade, 1.8 cm.  Clear margins.    01/03/2015 Pathologic Stage Stage 0: pTis pNx   01/20/2015 -  Anti-estrogen oral therapy Not recommended due to weakly positive hormonal receptors and cardiac history.   02/03/2015 - 03/10/2015 Radiation Therapy Adjuvant RT completed Pablo Ledger): Right breast 50 Gy over 25 fractions.    INTERVAL HISTORY:  Ms. Offenberger presents to the [facility] today for our initial meeting to review her survivorship care plan detailing her treatment course for breast cancer, as well as monitoring long-term side effects of that treatment, education regarding health maintenance, screening, and overall wellness and health promotion.     Overall, Ms. Desrochers reports feeling quite well since completing her radiation therapy approximately eight weeks ago.  She  reports that the skin overlying her breast remains darkened, but she feels it is lightening somewhat.  She denies any breast swelling or fatigue.  She has intermittent pains, particularly in her knees, related to her arthritis for which she takes tramadol, otherwise she denies any bone pain.  She denies any pain or nodularity in either breast, headache, cough, or shortness of breath.  She reports a good appetite and denies any weight loss.  She has some increased nasal drainage and post nasal drip that may be related to allergies.  Ms. Genna believes she may be due for her complete physical exam and has follow up appointments with Drs. [name] and [place] later this fall, and Dr. Burr Medico in the spring.  REVIEW OF SYSTEMS:  General: Denies fever, chills, or night sweats. Cardiac: Mild lower extremity edema, left greater than right.  Denies palpitations or chest pain.  Respiratory: Denies dyspnea on exertion. GI: Occasional constipation for which she takes Miralax to good relief.  Denies abdominal pain,diarrhea, nausea, or vomiting.  GU: Denies dysuria, hematuria, vaginal bleeding, vaginal discharge, or vaginal dryness.  Musculoskeletal: Left knee pain as above. Neuro: Denies headache or recent falls. Denies peripheral neuropathy. Skin: Denies rash, pruritis, or open wounds.  Breast: Denies any new tenderness, nipple changes, or nipple discharge.  Psych: Denies depression, anxiety, insomnia, or memory loss.   A 14-point review of systems was completed and was negative, except as noted above.   ONCOLOGY TREATMENT TEAM:  1. Surgeon:  Dr. [name] at [facility]  2. Medical Oncologist: Dr. Burr Medico 3. Radiation Oncologist: Dr. Pablo Ledger    PAST MEDICAL/SURGICAL HISTORY:  Past Medical  History  Diagnosis Date  . Paroxysmal ventricular tachycardia   . Ostium secundum type atrial septal defect   . Unspecified transient cerebral ischemia   . Takotsubo syndrome   . Unspecified vitamin D  deficiency   . Other pulmonary embolism and infarction   . Other chronic nonalcoholic liver disease   . Cerebral aneurysm, nonruptured   . Hypothyroidism   . Hypertension   . Hyperlipidemia   . Esophageal reflux   . Diverticulosis of colon (without mention of hemorrhage)   . Depressive disorder, not elsewhere classified   . Obstructive sleep apnea (adult) (pediatric)   . GI bleed   . PUD (peptic ulcer disease)   . Clostridium difficile infection   . Anxiety   . Arthritis   . 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 Sarcoid   . Presence of permanent cardiac pacemaker   . AICD (automatic cardioverter/defibrillator) present   . Stroke 2013    tia  . History of hiatal hernia   . GERD (gastroesophageal reflux disease)   . Internal hemorrhoids   . Hiatal hernia   . Radiation 02/03/15-03/10/15    Right Breast   Past Surgical History  Procedure Laterality Date  . Partial hysterectomy      Fibroids  . Cerebral aneurysm repair  02/1999  . Knee arthroscopy      bilateral  . Cardiac defibrillator placement  2006; 2012    BSX single chamber ICD  . Carotid dopplers  2006    neg  . Hospitalization  2004    GI bleed, PUD, diverticulosis (EGD,colonscopy)  . Hospitalization      PE, NSVT, s/p defib  . Abi  2006    normal  . Left heart catheterization with coronary angiogram N/A 02/22/2012    Procedure: LEFT HEART CATHETERIZATION WITH CORONARY ANGIOGRAM;  Surgeon: Burnell Blanks, MD;  Location: [facility] CATH LAB;  Service: Cardiovascular;  Laterality: N/A;  . Abdominal hysterectomy    . Breast lumpectomy with radioactive seed localization Right 01/03/2015    Procedure: BREAST LUMPECTOMY WITH RADIOACTIVE SEED LOCALIZATION;  Surgeon: Autumn Messing III, MD;  Location: [place];  Service: General;  Laterality: Right;     ALLERGIES:  Allergies  Allergen Reactions  . Ace Inhibitors     Angioedema; makes tongue "break out"  . Amitriptyline Hcl     REACTION: ? reaction  . Atorvastatin      REACTION: severe muscle pain  . Cetirizine Hcl     REACTION: sleepy  . Codeine     REACTION: itching  . Paroxetine     REACTION: ? reaction  . Ramipril Other (See Comments)    Tongue ulcers  . Rosuvastatin     REACTION: severe muscle pain  . Carbamazepine Rash    REACTION: ? reaction  . Dilantin [Phenytoin Sodium Extended] Rash     CURRENT MEDICATIONS:  Current Outpatient Prescriptions on File Prior to Visit  Medication Sig Dispense Refill  . acetaminophen (TYLENOL) 500 MG tablet Take 500 mg by mouth every 6 (six) hours as needed (pain).     [name] aspirin 81 MG tablet Take 81 mg by mouth daily.      . carvedilol (COREG) 12.5 MG tablet TAKE 1 AND 1/2 TABLETS TWICE DAILY WITH A MEAL 270 tablet 1  . cholecalciferol (VITAMIN D) 1000 UNITS tablet Take 2,000 Units by mouth daily.    . clobetasol cream (TEMOVATE) 0.05 % Apply topically 2 (two) times daily. For dermatitis  2  .  fexofenadine (ALLEGRA) 180 MG tablet Take 180 mg by mouth daily.    . fluticasone (FLONASE) 50 MCG/ACT nasal spray Place 2 sprays into both nostrils daily as needed for allergies or rhinitis. 48 g 1  . hydrochlorothiazide (HYDRODIURIL) 25 MG tablet Take 1 tablet (25 mg total) by mouth daily. 90 tablet 1  . HYDROcodone-acetaminophen (NORCO/VICODIN) 5-325 MG per tablet Take 1 tablet by mouth 3 (three) times daily as needed for moderate pain.   0  . latanoprost (XALATAN) 0.005 % ophthalmic solution Place 1 drop into both eyes at bedtime.    [name] levothyroxine (SYNTHROID) 50 MCG tablet Take 1 tablet (50 mcg total) by mouth daily. 90 tablet 3  . Multiple Vitamins-Minerals (MULTIVITAMIN PO) Take 1 tablet by mouth daily.    . pantoprazole (PROTONIX) 40 MG tablet TAKE 1 TABLET (40 MG TOTAL) BY MOUTH DAILY. 90 tablet 4  . polyethylene glycol powder (GLYCOLAX/MIRALAX) powder Take 17 grams by mouth daily 255 g 3  . potassium chloride (KLOR-CON M10) 10 MEQ tablet TAKE 1/2 (5 MEQ TOTAL)     TABLET DAILY 45 tablet 3  . ranitidine  (ZANTAC) 150 MG tablet Take 1 tablet (150 mg total) by mouth daily as needed for heartburn. 30 tablet 9  . simvastatin (ZOCOR) 40 MG tablet TAKE 1/2 TABLET DAILY 45 tablet 1  . traMADol (ULTRAM) 50 MG tablet Take 50 mg by mouth every 8 (eight) hours as needed (pain). For pain    . zolpidem (AMBIEN) 10 MG tablet Take 1 tablet (10 mg total) by mouth at bedtime as needed. for sleep 90 tablet 1  . [DISCONTINUED] potassium chloride (K-DUR) 10 MEQ tablet Take 0.5 tablets (5 mEq total) by mouth daily. 1/2 tab po qd 45 tablet 1   No current facility-administered medications on file prior to visit.     ONCOLOGIC FAMILY HISTORY:  Family History  Problem Relation Age of Onset  . Diabetes Mother   . Alzheimer's disease Mother   . Cardiomyopathy      Family history  . Other Brother     Thyroid problem  . Allergies Sister   . Heart disease Sister   . Heart disease Brother   . Prostate cancer Brother 3    same brother as throat cancer  . Throat cancer Brother     dx in his 55s; also a smoker  . Heart disease Father   . Cancer Maternal Grandmother 42    colon cancer or abdominal cancer  . Lung cancer Maternal Grandfather 52  . Breast cancer Maternal Aunt 17  . Colon cancer Maternal Aunt 78    same sister as breast at 75  . Lung cancer Maternal Uncle   . Breast cancer Maternal Aunt     dx in her 25s  . Pancreatic cancer Cousin 29    maternal first cousin  . Breast cancer Cousin     paternal first cousin twice removed died in her 65s     GENETIC COUNSELING/TESTING: Yes, completed 12/08/2014.  CancerNext panel reveals no clinically significant variant detected in APC, ATM, BARD1, BMPR1A, BRCA1, BRCA2, BRIP1, CDH1, CDK4, CDKN2A, CHEK2, EPCAM, GREM1, MLH1, MRE11A, MSH2, MSH 6, MUTYH, NBN, NF1, PALB2, PMS2, POLD1, POLE, PTEN, RAD50, RAD51D, SMAD4, SMARCA4, STK1, or TP53.  SOCIAL HISTORY:  MATILDE POTTENGER is divorced and lives alone in [place], [place].  She has 2 children.  Ms. Blahnik  is currently retired from [employer].  She denies any current or history of tobacco or illicit  drug use.  Social alcohol use.     PHYSICAL EXAMINATION:  Vital Signs:   Filed Vitals:   05/10/15 1130  BP: 119/57  Pulse: 69  Temp: 98 F (36.7 C)  Resp: 18   ECOG Performance Status: 1 General: Well-nourished, well-appearing female in no acute distress.  She is unaccompanied in clinic today.   HEENT: Head is atraumatic and normocephalic.  Pupils equal and reactive to light and accomodation. Conjunctivae clear without exudate.  Sclerae anicteric. Oral mucosa is pink, moist, and intact without lesions.  Oropharynx is pink without lesions or erythema.  Lymph: No cervical, supraclavicular, infraclavicular, or axillary lymphadenopathy noted on palpation.  Cardiovascular: Regular rate and rhythm without murmurs, rubs, or gallops. Respiratory: Clear to auscultation bilaterally. Chest expansion symmetric without accessory muscle use on inspiration or expiration.  GI: Abdomen soft and round. No tenderness to palpation. Bowel sounds normoactive in 4 quadrants.  GU: Deferred.  Musculoskeletal: Muscle strength 5/5 in all extremities.  Left knee stiff; non tender to palpation.  Neuro: No focal deficits. Steady gait.  Psych: Mood and affect normal and appropriate for situation.  Extremities: No edema, cyanosis, or clubbing noted.  Skin: Skin over right breast darkened secondary to radiation.  Warm and dry. No open lesions noted.   LABORATORY DATA:  None for this visit.  DIAGNOSTIC IMAGING:  None for this visit.     ASSESSMENT AND PLAN:   1. 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  Ms. Barcus is doing well today without clinical evidence worrisome for disease recurrence and will follow-up with her medical oncologist,  Dr. Burr Medico in June 2017 with history and  physical exam per surveillance protocol. She is due to see Dr. Pablo Ledger in December of this year, and Dr. [name] sometime in the spring.  She will be due mammography in March 2017. I have reinforced to her the rationale behind not recommending anti-estrogen prophylaxis.  A comprehensive survivorship care plan and treatment summary was reviewed with the patient today detailing her breast cancer diagnosis, treatment course, potential late/long-term effects of treatment, appropriate follow-up care with recommendations for the future, and patient education resources.  A copy of this summary, along with a letter will be sent to the patient's primary care provider via in basket message after today's visit.  Ms. Hattabaugh is welcome to return to the Survivorship Clinic in the future, as needed; no follow-up will be scheduled at this time.    2. Left knee pain: Ms. Reaves' complaints of left knee pain and physical presentation appear to be consistent with osteoarthritis.  She states that she believes that she is due for her complete physical examination with Dr. Glori Bickers and will discuss this further with her.  She is unable to take NSAID therapy due to her cardiac history and will continue to use the tramadol at this time.     3. Cancer screening:  Due to Ms. Pask's history and her age, she should receive screening for skin cancers and colon cancer.  The information and recommendations are listed on the patient's comprehensive care plan/treatment summary and were reviewed in detail with the patient.    4. Health maintenance and wellness promotion: Ms. Danis was encouraged to consume 5-7 servings of fruits and vegetables per day. We reviewed the "Nutrition Rainbow" handout, as well as the handout about "Nutrition for Breast Cancer Survivors."  She was also  encouraged to engage in moderate to vigorous exercise for 30 minutes per day most days of the week. We discussed the [employer] fitness program, which is designed for  cancer survivors to help them become more physically fit after cancer treatments.  She was instructed to limit her alcohol consumption and continue to abstain from tobacco use.   5. Support services/counseling: It is not uncommon for this period of the patient's cancer care trajectory to be one of many emotions and stressors.  We discussed an opportunity for her to participate in the next session of [facility] ("Finding Your New Normal") support group series designed for patients after they have completed treatment.   Ms. Gellerman was encouraged to take advantage of our many other support services programs, support groups, and/or counseling in coping with her new life as a cancer survivor after completing anti-cancer treatment.  She was offered support today through active listening and expressive supportive counseling.  She was given information regarding our available services and encouraged to contact me with any questions or for help enrolling in any of our support group/programs.    A total of 50 minutes of face-to-face time was spent with this patient with greater than 50% of that time in counseling and care-coordination.   Sylvan Cheese, NP  Survivorship Program [facility] [phone]   Note: PRIMARY CARE PROVIDER [place], [place] [phone]

## 2015-05-17 ENCOUNTER — Encounter: Payer: Self-pay | Admitting: Family Medicine

## 2015-05-17 ENCOUNTER — Ambulatory Visit (INDEPENDENT_AMBULATORY_CARE_PROVIDER_SITE_OTHER): Payer: Medicare Other | Admitting: Family Medicine

## 2015-05-17 VITALS — BP 136/68 | HR 64 | Temp 97.5°F | Ht 66.5 in | Wt 207.8 lb

## 2015-05-17 DIAGNOSIS — C50411 Malignant neoplasm of upper-outer quadrant of right female breast: Secondary | ICD-10-CM | POA: Diagnosis not present

## 2015-05-17 DIAGNOSIS — Z23 Encounter for immunization: Secondary | ICD-10-CM

## 2015-05-17 DIAGNOSIS — R05 Cough: Secondary | ICD-10-CM | POA: Insufficient documentation

## 2015-05-17 DIAGNOSIS — F458 Other somatoform disorders: Secondary | ICD-10-CM

## 2015-05-17 DIAGNOSIS — R058 Other specified cough: Secondary | ICD-10-CM

## 2015-05-17 DIAGNOSIS — R0989 Other specified symptoms and signs involving the circulatory and respiratory systems: Secondary | ICD-10-CM | POA: Insufficient documentation

## 2015-05-17 MED ORDER — AZITHROMYCIN 250 MG PO TABS: ORAL_TABLET | ORAL | Status: DC

## 2015-05-17 NOTE — Assessment & Plan Note (Signed)
Overall doing well after lumpectomy and radiation Some globus sensation- unsure if rel to the radiation tx -will follow

## 2015-05-17 NOTE — Assessment & Plan Note (Signed)
With yellow to brown phlegm in a former smoker who recently had radiation for breast cancer and also a hx of bronchiectasis  Cover with zithromax  tx allergy post nasal drip with allegra and flonase  Update if not starting to improve in a week or if worsening

## 2015-05-17 NOTE — Progress Notes (Signed)
Subjective:    Patient ID: [name], female    DOB: [birth year], 64 y.o.   MRN: [id number]  HPI Here for f/u of chronic medical problems   This spring dx with breast ca R breast DCIS stage 0 tx with lumpectomy and radiation  F/u recently was good No anti estrogen meds  Genetic testing was normal   She had discomfort of the skin - looking better  Using vit E oil  She did very well emotionally as well - has a good attitude  Was tired a lot -improved   Now she is getting a lot of mucous in her throat- ? If rel to her radiation or not  Started taking mucinex which is helpful -in terms of getting it up  Does feel like she has trouble swallowing and food gets stuck  2014 EGD-gastritis w/o stricture with nl bx  No regular heartburn  Takes the protonix every day  Takes zantac only when she gets heartburn Clears her throat constantly   Also allergies with post nasal drip - a lot Takes flonase Not on her allegra     Due mammogram 3/17   Recommend keeping up with colon and skin cancer screening   Wants flu shot today  bp is stable today  No cp or palpitations or headaches or edema  No side effects to medicines  BP Readings from Last 3 Encounters:  05/17/15 136/68  05/10/15 119/57  03/09/15 141/70      Hypothyroid Lab Results  Component Value Date   TSH 2.82 06/18/2014     hyperljipidemia Lab Results  Component Value Date   CHOL 156 06/18/2014   HDL 52.80 06/18/2014   LDLCALC 90 06/18/2014   LDLDIRECT 141.8 07/04/2007   TRIG 67.0 06/18/2014   CHOLHDL 3 06/18/2014   zocor and diet  Review of Systems    Review of Systems  Constitutional: Negative for fever, appetite change, and unexpected weight change. ENT pos for post nasal drip and phlegm in throat / neg for congestion or facial pain   Eyes: Negative for pain and visual disturbance.  Respiratory: Negative for wheeze  and shortness of breath.   Cardiovascular: Negative for cp or palpitations      Gastrointestinal: Negative for nausea, diarrhea and constipation. neg for heartburn  Genitourinary: Negative for urgency and frequency.  Skin: Negative for pallor or rash   Neurological: Negative for weakness, light-headedness, numbness and headaches.  Hematological: Negative for adenopathy. Does not bruise/bleed easily.  Psychiatric/Behavioral: Negative for dysphoric mood. The patient is not nervous/anxious.      Objective:   Physical Exam  Constitutional: She appears well-developed and well-nourished. No distress.  obese and well appearing   HENT:  Head: Normocephalic and atraumatic.  Right Ear: External ear normal.  Left Ear: External ear normal.  Mouth/Throat: Oropharynx is clear and moist.  Nares are boggy No sinus tenderness Clear rhinorrhea and post nasal drip   Eyes: Conjunctivae and EOM are normal. Pupils are equal, round, and reactive to light. Right eye exhibits no discharge. Left eye exhibits no discharge.  Neck: Normal range of motion. Neck supple. No JVD present. Carotid bruit is not present. No thyromegaly present.  Cardiovascular: Normal rate, regular rhythm, normal heart sounds and intact distal pulses.  Exam reveals no gallop.   Pulmonary/Chest: Effort normal and breath sounds normal. No respiratory distress. She has no wheezes. She has no rales. She exhibits no tenderness.  No crackles  Good air exch  Cough sounds slightly  wet    Abdominal: Soft. Bowel sounds are normal. She exhibits no distension, no abdominal bruit and no mass. There is no tenderness.  Musculoskeletal: She exhibits no edema.  Lymphadenopathy:    She has no cervical adenopathy.  Neurological: She is alert. She has normal reflexes.  Skin: Skin is warm and dry. No rash noted.  Psychiatric: She has a normal mood and affect.          Assessment & Plan:   Problem List Items Addressed This Visit      Other   Breast cancer of upper-outer quadrant of right female breast    Overall doing  well after lumpectomy and radiation Some globus sensation- unsure if rel to the radiation tx -will follow       Globus sensation - Primary    Ongoing-worse with fall allergy season and also after radiation for breast cancer  ? Some dysphagia - but gerd symptoms seem well controlled with protonix  Will max allergy tx with allegra 180 otc daily and flonase -also allergen avoidance  Continue ppi  Also tx with zpak for prod cough   If no imp consider eval by GI - (last EGD was 2014 with gastritis)       Productive cough    With yellow to brown phlegm in a former smoker who recently had radiation for breast cancer and also a hx of bronchiectasis  Cover with zithromax  tx allergy post nasal drip with allegra and flonase  Update if not starting to improve in a week or if worsening          Other Visit Diagnoses    Need for influenza vaccination        Relevant Orders    Flu Vaccine QUAD 36+ mos PF IM (Fluarix & Fluzone Quad PF) (Completed)

## 2015-05-17 NOTE — Assessment & Plan Note (Signed)
Ongoing-worse with fall allergy season and also after radiation for breast cancer  ? Some dysphagia - but gerd symptoms seem well controlled with protonix  Will max allergy tx with allegra 180 otc daily and flonase -also allergen avoidance  Continue ppi  Also tx with zpak for prod cough   If no imp consider eval by GI - (last EGD was 2014 with gastritis)

## 2015-05-17 NOTE — Progress Notes (Signed)
Pre visit review using our clinic review tool, if applicable. No additional management support is needed unless otherwise documented below in the visit note. 

## 2015-05-17 NOTE — Patient Instructions (Signed)
Get back on allegra daily for allergies and post nasal drip  Because you have some colored mucous - take zithromax as directed Continue mucinex  If not improved in 1-2 weeks - let me know , we may want to have GI check you out for the swallowing issue and reflux  Take care of yourself  Gradually get back to exercise

## 2015-07-02 ENCOUNTER — Other Ambulatory Visit: Payer: Self-pay | Admitting: Family Medicine

## 2015-07-04 NOTE — Telephone Encounter (Signed)
Px written for call in   

## 2015-07-04 NOTE — Telephone Encounter (Signed)
Electronic refill request, pt has CPE scheduled 11/22/15, last refilled on 5/19/16390 with 1 additional refill, please advise

## 2015-07-04 NOTE — Telephone Encounter (Signed)
Rx called in as prescribed 

## 2015-08-04 ENCOUNTER — Ambulatory Visit: Payer: Medicare Other | Attending: Rheumatology | Admitting: Physical Therapy

## 2015-08-04 DIAGNOSIS — R262 Difficulty in walking, not elsewhere classified: Secondary | ICD-10-CM | POA: Diagnosis present

## 2015-08-04 DIAGNOSIS — M25561 Pain in right knee: Secondary | ICD-10-CM | POA: Diagnosis present

## 2015-08-04 DIAGNOSIS — M25662 Stiffness of left knee, not elsewhere classified: Secondary | ICD-10-CM | POA: Insufficient documentation

## 2015-08-04 DIAGNOSIS — M25562 Pain in left knee: Secondary | ICD-10-CM | POA: Diagnosis present

## 2015-08-04 DIAGNOSIS — M25661 Stiffness of right knee, not elsewhere classified: Secondary | ICD-10-CM | POA: Insufficient documentation

## 2015-08-04 NOTE — Patient Instructions (Signed)
   Copyright  VHI. All rights reserved.  HIP: Flexion / KNEE: Extension, Straight Leg Raise   Raise leg, keeping knee straight. Perform slowly. _10__ reps per set, __1-2_ sets per day, _5__ days per week   Copyright  VHI. All rights reserved.  Heel Slide   Bend knee and pull heel toward buttocks. Hold ___10_ seconds. Return. Repeat with other knee. Repeat _5-10___ times. Do ___2_ sessions per day.  http://[email or web address].[email or web address]   Copyright  VHI. All rights reserved.     Raise leg until knee is straight. __10-20_ reps per set, _2__ sets per day, _5_ days per week    http://[email or web address].exer.[email or web address]   Copyright  VHI. All rights reserved.  Quad Set   Slowly tighten muscles on thigh of straight leg while counting out loud to __5-10__. Repeat with other leg. Repeat ___10_ times. Do __2__ sessions per day.  http://[email or web address].[email or web address]   Copyright  VHI. All rights reserved.

## 2015-08-05 NOTE — Therapy (Signed)
[place], [place], [address] Phone: [phone]   Fax:  [phone]  Physical Therapy Evaluation  Patient Details  Name: [name] MRN: [id number] Date of Birth: [birth year] Referring Provider: Doyne Keel  Encounter Date: 08/04/2015      PT End of Session - 08/05/15 0912    Visit Number 1   Number of Visits 12   Date for PT Re-Evaluation 09/16/15   PT Start Time [id number]   PT Stop Time 1635   PT Time Calculation (min) 50 min   Activity Tolerance Patient tolerated treatment well   Behavior During Therapy [facility] for tasks assessed/performed      Past Medical History  Diagnosis Date  . Paroxysmal ventricular tachycardia   . Ostium secundum type atrial septal defect   . Unspecified transient cerebral ischemia   . Takotsubo syndrome   . Unspecified vitamin D deficiency   . Other pulmonary embolism and infarction   . Other chronic nonalcoholic liver disease   . Cerebral aneurysm, nonruptured   . Hypothyroidism   . Hypertension   . Hyperlipidemia   . Esophageal reflux   . Diverticulosis of colon (without mention of hemorrhage)   . Depressive disorder, not elsewhere classified   . Obstructive sleep apnea (adult) (pediatric)   . GI bleed   . PUD (peptic ulcer disease)   . Clostridium difficile infection   . Anxiety   . Arthritis   . 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 Sarcoid   . Presence of permanent cardiac pacemaker   . AICD (automatic cardioverter/defibrillator) present   . Stroke 2013    tia  . History of hiatal hernia   . GERD (gastroesophageal reflux disease)   . Internal hemorrhoids   . Hiatal hernia   . Radiation 02/03/15-03/10/15    Right Breast    Past Surgical History  Procedure Laterality Date  . Partial hysterectomy      Fibroids  . Cerebral aneurysm repair  02/1999  . Knee arthroscopy      bilateral  . Cardiac defibrillator placement  2006; 2012    BSX single chamber ICD   . Carotid dopplers  2006    neg  . Hospitalization  2004    GI bleed, PUD, diverticulosis (EGD,colonscopy)  . Hospitalization      PE, NSVT, s/p defib  . Abi  2006    normal  . Left heart catheterization with coronary angiogram N/A 02/22/2012    Procedure: LEFT HEART CATHETERIZATION WITH CORONARY ANGIOGRAM;  Surgeon: Burnell Blanks, MD;  Location: [facility] CATH LAB;  Service: Cardiovascular;  Laterality: N/A;  . Abdominal hysterectomy    . Breast lumpectomy with radioactive seed localization Right 01/03/2015    Procedure: BREAST LUMPECTOMY WITH RADIOACTIVE SEED LOCALIZATION;  Surgeon: Autumn Messing III, MD;  Location: [place];  Service: General;  Laterality: Right;    There were no vitals filed for this visit.  Visit Diagnosis:  Arthralgia of both knees  Difficulty walking  Joint stiffness of knee, right  Joint stiffness of knee, left      Subjective Assessment - 08/04/15 1555    Subjective Patient reports pain, stiffness, weakness from chronic bilateral knee pain due to OA. She has swelling, pain sometimes radiates into lower and upper leg. She has occ spasms in toes.  She has difficulty walking, ability to maintain her home, transferring in and out of the car.  Can't particiapte in church activities as she would like  to.  She cannot do yardwork, poor sleep.    Pertinent History see snapshot, has AICD (pacemaker) and history of CA in [place].  She has had multiple injections and bilateral arthroscopic surgery 20 yrs ago   Limitations Lifting;Standing;Walking;House hold activities;Other (comment);Sitting   How long can you stand comfortably? not long at all, never really comfortable   How long can you walk comfortably? not comfortable, has cane uses in community   Diagnostic tests XR none recent   Patient Stated Goals to hold out for knee surgery, would like to strengthen the muscles   Currently in Pain? Yes   Pain Score 7    Pain Location Knee  bilateral L>R   Pain Orientation  Left;Right   Pain Descriptors / Indicators Tiring;Aching;Sore   Pain Type Chronic pain   Pain Onset More than a month ago   Pain Frequency Intermittent   Aggravating Factors  walking, standing, sitting too long    Pain Relieving Factors laying down, rest, meds, aleternate ice and heat    Multiple Pain Sites No            [facility] PT Assessment - 08/04/15 1558    Assessment   Medical Diagnosis bilateral knee pain    Referring Provider [place]   Onset Date/Surgical Date --  chronic   Prior Therapy Not for knees   Precautions   Precautions None   Restrictions   Weight Bearing Restrictions No   Balance Screen   Has the patient fallen in the past 6 months No  has had several near misses    Has the patient had a decrease in activity level because of a fear of falling?  Yes   Is the patient reluctant to leave their home because of a fear of falling?  No   Home Environment   Living Environment Private residence   Living Arrangements Other relatives   Type of [place] Access Stairs to enter   Entrance Stairs-Number of Steps 7   Entrance Stairs-Rails Right   Prior Function   Level of Independence Independent with household mobility without device   Cognition   Overall Cognitive Status Within Functional Limits for tasks assessed   Observation/Other Assessments   Focus on Therapeutic Outcomes (FOTO)  76%   Observation/Other Assessments-Edema    Edema --  pt with jeans on today   Sensation   Light Touch Appears Intact   Posture/Postural Control   Posture/Postural Control Postural limitations   Postural Limitations Flexed trunk   Posture Comments genu varus bilat. knees flexed with gait   AROM   Right Knee Extension 5   Right Knee Flexion 94   Left Knee Extension 0   Left Knee Flexion 120   Strength   Right Hip Flexion 4-/5   Right Hip ABduction 4/5   Left Hip Flexion 4-/5   Left Hip ABduction 4/5   Right Knee Flexion 4+/5   Right Knee Extension 4/5   Left  Knee Flexion 4+/5   Left Knee Extension 4/5   Palpation   Palpation comment hypomobile patelaa bilaterally.                    [facility] Adult PT Treatment/Exercise - 08/04/15 1558    Self-Care   Self-Care ADL's;Other Self-Care Comments   ADL's HEP   Other Self-Care Comments  post therapy group and water    Knee/Hip Exercises: Seated   Long Arc Quad Strengthening;1 set;10 reps   Long [employer]  Limitations HEP   Knee/Hip Exercises: Supine   Quad Sets Strengthening;Both;1 set   Quad Sets Limitations HEP   Heel Slides AAROM;Strengthening;Both;1 set   Heel Slides Limitations HEP   Knee Flexion AAROM;Both;1 set   Knee Flexion Limitations HEP                PT Education - 08/05/15 0830    Education provided Yes   Education Details PT.POC, level 1 knee ex, post therapy group, Pool    Person(s) Educated Patient   Methods Explanation;Demonstration;Handout   Comprehension Verbalized understanding;Returned demonstration          PT Short Term Goals - 08/05/15 1200    PT SHORT TERM GOAL #1   Title Pt will be I with basic HEP   Time 2   PT SHORT TERM GOAL #2   Title Pt will be able to report 15-25% ess pain, stiffness in knees with normal ADLs.    Time 3   Period Weeks   Status New   PT SHORT TERM GOAL #3   Title Pt will be I with concepts of RICE, posture   Time 3   Period Weeks   Status New           PT Long Term Goals - 08/05/15 1201    PT LONG TERM GOAL #1   Title Pt will be able to walk/stand in her home for light housework with pain </=6/10 for short periods.    Time 6   Period Weeks   Status New   PT LONG TERM GOAL #2   Title Pt will be able to get in and out of car and do basic transfers with no more than min difficulty   Time 6   Period Weeks   Status New   PT LONG TERM GOAL #3   Title Pt will be able to participate in church activities more frequently due to improved mobility and confidence.    Time 6   Period Weeks   Status New   PT  LONG TERM GOAL #4   Title Pt will be I with more advanced HEP for LE strength and ROM as of last visit.    Time 6   Period Weeks   Status New   PT LONG TERM GOAL #5   Title FOTO score will decrease to no more than 60% limited to dmeo improvement   Additional Long Term Goals   Additional Long Term Goals Yes   PT LONG TERM GOAL #6   Title Pt will enroll in community based exercise plan and report intention of continuing fitness/exercise   Time 6   Period Weeks   Status New               Plan - 08/05/15 0912    Clinical Impression Statement Pt presents with significant limitations in mobility due to knee stiffness, pain, malalignment, weakness in bilateral knees.  She will likely have a TKR bilateral this spring/summer.  She hopes to establish HEP and improve strength prior to surgery.     Pt will benefit from skilled therapeutic intervention in order to improve on the following deficits Decreased range of motion;Difficulty walking;Increased fascial restricitons;Obesity;Decreased endurance;Decreased activity tolerance;Pain;Decreased balance;Hypomobility;Impaired flexibility;Improper body mechanics;Postural dysfunction;Increased edema;Decreased strength;Decreased mobility   Rehab Potential Good   PT Frequency 2x / week   PT Duration 6 weeks   PT Treatment/Interventions Ultrasound;DME Instruction;Patient/family education;Passive range of motion;Dry needling;ADLs/Self Care Home Management;Cryotherapy;Moist Heat;Balance training;Iontophoresis 4mg /ml Dexamethasone;Manual techniques;Manual lymph drainage;Vasopneumatic Device;Taping;Functional  mobility training;Stair training;Gait training   PT Next Visit Plan check level 1 knee and progress, NuStep, modalities but NO STIM (has pacemaker)   PT Home Exercise Plan level 1 knee    Consulted and Agree with Plan of Care Patient          G-Codes - 08/13/15 1631    Functional Assessment Tool Used FOTO   Functional Limitation Mobility: Walking  and moving around   Mobility: Walking and Moving Around Current Status [phone]) At least 60 percent but less than 80 percent impaired, limited or restricted   Mobility: Walking and Moving Around Goal Status [phone]) At least 40 percent but less than 60 percent impaired, limited or restricted       Problem List Patient Active Problem List   Diagnosis Date Noted  . Globus sensation 05/17/2015  . Productive cough 05/17/2015  . Genetic testing 12/23/2014  . Family history of breast cancer   . Family history of colon cancer   . Family history of pancreatic cancer   . Breast cancer of upper-outer quadrant of right female breast ([place]) 11/23/2014  . Vaginal discharge 10/19/2014  . Trichomonal vaginitis 10/19/2014  . Allergy to ACE inhibitors 09/27/2014  . Encounter for Medicare annual wellness exam 06/27/2014  . Hyperglycemia 06/23/2013  . Routine general medical examination at a health care facility 06/23/2013  . Tongue lesion 02/17/2013  . Dizziness and giddiness 01/16/2013  . Disturbance of skin sensation 01/16/2013  . RLS (restless legs syndrome) 01/15/2013  . Hx of Clostridium difficile infection 10/10/2012  . Blood in stool 07/31/2012  . Bronchiectasis without acute exacerbation ([place]) 04/29/2012  . Dyspnea on exertion 04/02/2012  . Non-ischemic cardiomyopathy ([place]) 03/07/2012  . Colon cancer screening 01/11/2012  . Post-menopausal 01/11/2012  . Chest pain 11/07/2011  . Palpitations 04/17/2011  . Abnormal tympanic membrane 01/22/2011  . Obstructive sleep apnea 04/04/2010  . VENTRICULAR TACHYCARDIA 01/03/2009  . ICD  [employer]  Single chamber 01/03/2009  . PATENT FORAMEN OVALE 09/29/2008  . Vitamin D deficiency 08/17/2008  . Hypothyroidism 11/18/2006  . Hyperlipidemia 11/18/2006  . DEPRESSION 11/18/2006  . Essential hypertension 11/18/2006  . MITRAL VALVE PROLAPSE 11/18/2006  . CEREBRAL ANEURYSM 11/18/2006  . ALLERGIC RHINITIS 11/18/2006  . GERD 11/18/2006  .  DIVERTICULOSIS, COLON 11/18/2006  . Fatty liver 11/18/2006  . FIBROMYALGIA 11/18/2006    PAA,JENNIFER 08/05/2015, 12:19 PM  [facility] [address] [place], [place], [address] Phone: [phone]   Fax:  [phone]  Name: GERALDENE HOLK MRN: [id number] Date of Birth: May 24, 1951  Raeford Razor, PT 08/05/2015 12:20 PM Phone: [phone] Fax: [phone]

## 2015-08-08 ENCOUNTER — Ambulatory Visit: Payer: Medicare Other | Admitting: Physical Therapy

## 2015-08-08 DIAGNOSIS — M25662 Stiffness of left knee, not elsewhere classified: Secondary | ICD-10-CM

## 2015-08-08 DIAGNOSIS — M25562 Pain in left knee: Principal | ICD-10-CM

## 2015-08-08 DIAGNOSIS — M25661 Stiffness of right knee, not elsewhere classified: Secondary | ICD-10-CM

## 2015-08-08 DIAGNOSIS — M25561 Pain in right knee: Secondary | ICD-10-CM

## 2015-08-08 DIAGNOSIS — R262 Difficulty in walking, not elsewhere classified: Secondary | ICD-10-CM

## 2015-08-08 NOTE — Patient Instructions (Signed)
ABDUCTION: Side-Lying (Active)   Lie on left side, top leg straight. Raise top leg as far as possible. Complete _2__ sets of _10__ repetitions. Perform _2__ sessions per day.  Supine Hamstring Stretch    Lift one leg, placing hands behind thigh. Gently pull leg toward torso until a stretch is felt. Other leg is bent and back is flat against floor. Repeat with other leg. Repeat __3__ times. Do _2___ sessions per day. \Knee Extension: Resisted (Sitting)   With band looped around right ankle and under other foot, straighten leg with ankle loop. Keep other leg bent to increase resistance. Repeat _15___ times per set. Do _2___ sets per session. Do 2____ sessions per day.  http://orth.exer.us/690   CKnee Flexion: Resisted (Sitting)   Sit with band under left foot and looped around ankle of supported leg. Pull unsupported leg back. Repeat __15__ times per set. Do _2___ sets per session. Do _2___ sessions per day.  http://orth.exer.us/695   Copyright  VHI. All rights reserved.

## 2015-08-08 NOTE — Therapy (Signed)
[place], [place], [address] Phone: [phone]   Fax:  [phone]  Physical Therapy Treatment  Patient Details  Name: [name] MRN: [id number] Date of Birth: 07-07-1951 Referring Provider: Doyne Keel  Encounter Date: 08/08/2015      PT End of Session - 08/08/15 1503    Visit Number 2   Number of Visits 12   Date for PT Re-Evaluation 09/16/15   PT Start Time 0257   PT Stop Time 0335   PT Time Calculation (min) 38 min      Past Medical History  Diagnosis Date  . Paroxysmal ventricular tachycardia   . Ostium secundum type atrial septal defect   . Unspecified transient cerebral ischemia   . Takotsubo syndrome   . Unspecified vitamin D deficiency   . Other pulmonary embolism and infarction   . Other chronic nonalcoholic liver disease   . Cerebral aneurysm, nonruptured   . Hypothyroidism   . Hypertension   . Hyperlipidemia   . Esophageal reflux   . Diverticulosis of colon (without mention of hemorrhage)   . Depressive disorder, not elsewhere classified   . Obstructive sleep apnea (adult) (pediatric)   . GI bleed   . PUD (peptic ulcer disease)   . Clostridium difficile infection   . Anxiety   . Arthritis   . 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 Sarcoid   . Presence of permanent cardiac pacemaker   . AICD (automatic cardioverter/defibrillator) present   . Stroke 2013    tia  . History of hiatal hernia   . GERD (gastroesophageal reflux disease)   . Internal hemorrhoids   . Hiatal hernia   . Radiation 02/03/15-03/10/15    Right Breast    Past Surgical History  Procedure Laterality Date  . Partial hysterectomy      Fibroids  . Cerebral aneurysm repair  02/1999  . Knee arthroscopy      bilateral  . Cardiac defibrillator placement  2006; 2012    BSX single chamber ICD  . Carotid dopplers  2006    neg  . Hospitalization  2004    GI bleed, PUD, diverticulosis (EGD,colonscopy)  .  Hospitalization      PE, NSVT, s/p defib  . Abi  2006    normal  . Left heart catheterization with coronary angiogram N/A 02/22/2012    Procedure: LEFT HEART CATHETERIZATION WITH CORONARY ANGIOGRAM;  Surgeon: Burnell Blanks, MD;  Location: [facility] CATH LAB;  Service: Cardiovascular;  Laterality: N/A;  . Abdominal hysterectomy    . Breast lumpectomy with radioactive seed localization Right 01/03/2015    Procedure: BREAST LUMPECTOMY WITH RADIOACTIVE SEED LOCALIZATION;  Surgeon: Autumn Messing III, MD;  Location: [place];  Service: General;  Laterality: Right;    There were no vitals filed for this visit.  Visit Diagnosis:  Arthralgia of both knees  Difficulty walking  Joint stiffness of knee, right  Joint stiffness of knee, left      Subjective Assessment - 08/08/15 1500    Currently in Pain? Yes   Pain Score 7    Pain Location Knee   Pain Orientation Right;Left   Pain Descriptors / Indicators Tightness  hurting            [facility] PT Assessment - 08/08/15 1504    AROM   Right Knee Flexion 97  [place] [facility] Adult PT Treatment/Exercise - 08/08/15 1509    Knee/Hip Exercises: Stretches   Active Hamstring Stretch 3 reps;30 seconds   Knee: Self-Stretch to increase Flexion 5 reps;20 seconds   Knee: Self-Stretch Limitations heel slide with strap assist   Knee/Hip Exercises: Seated   Long Arc Quad Both;20 reps;Weights   Long Arc Quad Weight 3 lbs.  red band for HEP   Hamstring Curl 20 reps   Hamstring Limitations red band   Knee/Hip Exercises: Supine   Quad Sets Both;1 set;10 reps   Short Arc Quad Sets Both;20 reps   Straight Leg Raises 2 sets;10 reps   Knee/Hip Exercises: Sidelying   Hip ABduction 1 set;10 reps                PT Education - 08/08/15 1551    Education provided Yes   Education Details Hamstring stretch, hip abduction, resisted red theraband knee flexion and extension with red band   Person(s) Educated Patient   Methods  Explanation;Handout   Comprehension Verbalized understanding          PT Short Term Goals - 08/08/15 1513    PT SHORT TERM GOAL #1   Title Pt will be I with basic HEP   Time 2   Status Achieved   PT SHORT TERM GOAL #2   Title Pt will be able to report 15-25% ess pain, stiffness in knees with normal ADLs.    Time 3   Period Weeks   Status On-going   PT SHORT TERM GOAL #3   Title Pt will be I with concepts of RICE, posture   Time 3   Period Weeks   Status On-going           PT Long Term Goals - 08/05/15 1201    PT LONG TERM GOAL #1   Title Pt will be able to walk/stand in her home for light housework with pain </=6/10 for short periods.    Time 6   Period Weeks   Status New   PT LONG TERM GOAL #2   Title Pt will be able to get in and out of car and do basic transfers with no more than min difficulty   Time 6   Period Weeks   Status New   PT LONG TERM GOAL #3   Title Pt will be able to participate in church activities more frequently due to improved mobility and confidence.    Time 6   Period Weeks   Status New   PT LONG TERM GOAL #4   Title Pt will be I with more advanced HEP for LE strength and ROM as of last visit.    Time 6   Period Weeks   Status New   PT LONG TERM GOAL #5   Title FOTO score will decrease to no more than 60% limited to dmeo improvement   Additional Long Term Goals   Additional Long Term Goals Yes   PT LONG TERM GOAL #6   Title Pt will enroll in community based exercise plan and report intention of continuing fitness/exercise   Time 6   Period Weeks   Status New               Plan - 08/08/15 1554    Clinical Impression Statement Progressed pt's HEP without increased pain.    PT Next Visit Plan check HEP and progress as tolerated (NO STIM) pacemaker        Problem List Patient Active Problem  List   Diagnosis Date Noted  . Globus sensation 05/17/2015  . Productive cough 05/17/2015  . Genetic testing 12/23/2014  . Family  history of breast cancer   . Family history of colon cancer   . Family history of pancreatic cancer   . Breast cancer of upper-outer quadrant of right female breast ([place]) 11/23/2014  . Vaginal discharge 10/19/2014  . Trichomonal vaginitis 10/19/2014  . Allergy to ACE inhibitors 09/27/2014  . Encounter for Medicare annual wellness exam 06/27/2014  . Hyperglycemia 06/23/2013  . Routine general medical examination at a health care facility 06/23/2013  . Tongue lesion 02/17/2013  . Dizziness and giddiness 01/16/2013  . Disturbance of skin sensation 01/16/2013  . RLS (restless legs syndrome) 01/15/2013  . Hx of Clostridium difficile infection 10/10/2012  . Blood in stool 07/31/2012  . Bronchiectasis without acute exacerbation ([place]) 04/29/2012  . Dyspnea on exertion 04/02/2012  . Non-ischemic cardiomyopathy ([place]) 03/07/2012  . Colon cancer screening 01/11/2012  . Post-menopausal 01/11/2012  . Chest pain 11/07/2011  . Palpitations 04/17/2011  . Abnormal tympanic membrane 01/22/2011  . Obstructive sleep apnea 04/04/2010  . VENTRICULAR TACHYCARDIA 01/03/2009  . ICD  [employer]  Single chamber 01/03/2009  . PATENT FORAMEN OVALE 09/29/2008  . Vitamin D deficiency 08/17/2008  . Hypothyroidism 11/18/2006  . Hyperlipidemia 11/18/2006  . DEPRESSION 11/18/2006  . Essential hypertension 11/18/2006  . MITRAL VALVE PROLAPSE 11/18/2006  . CEREBRAL ANEURYSM 11/18/2006  . ALLERGIC RHINITIS 11/18/2006  . GERD 11/18/2006  . DIVERTICULOSIS, COLON 11/18/2006  . Fatty liver 11/18/2006  . FIBROMYALGIA 11/18/2006    Dorene Ar, PTA 08/08/2015, 3:56 PM  [facility] [address] [place], [place], [address] Phone: [phone]   Fax:  [phone]  Name: [name] MRN: [id number] Date of Birth: May 30, 1951

## 2015-08-17 ENCOUNTER — Ambulatory Visit: Payer: Medicare Other | Admitting: Physical Therapy

## 2015-08-17 DIAGNOSIS — M25661 Stiffness of right knee, not elsewhere classified: Secondary | ICD-10-CM

## 2015-08-17 DIAGNOSIS — M25662 Stiffness of left knee, not elsewhere classified: Secondary | ICD-10-CM

## 2015-08-17 DIAGNOSIS — M25562 Pain in left knee: Principal | ICD-10-CM

## 2015-08-17 DIAGNOSIS — M25561 Pain in right knee: Secondary | ICD-10-CM

## 2015-08-17 DIAGNOSIS — R262 Difficulty in walking, not elsewhere classified: Secondary | ICD-10-CM

## 2015-08-17 NOTE — Therapy (Signed)
[place], [place], [address] Phone: [phone]   Fax:  [phone]  Physical Therapy Treatment  Patient Details  Name: [name] MRN: [id number] Date of Birth: April 12, 1951 Referring Provider: Doyne Keel  Encounter Date: 08/17/2015      PT End of Session - 08/17/15 0949    Visit Number 3   Number of Visits 12   PT Start Time 0933   PT Stop Time 1030   PT Time Calculation (min) 57 min   Activity Tolerance Patient tolerated treatment well   Behavior During Therapy [facility] for tasks assessed/performed      Past Medical History  Diagnosis Date  . Paroxysmal ventricular tachycardia   . Ostium secundum type atrial septal defect   . Unspecified transient cerebral ischemia   . Takotsubo syndrome   . Unspecified vitamin D deficiency   . Other pulmonary embolism and infarction   . Other chronic nonalcoholic liver disease   . Cerebral aneurysm, nonruptured   . Hypothyroidism   . Hypertension   . Hyperlipidemia   . Esophageal reflux   . Diverticulosis of colon (without mention of hemorrhage)   . Depressive disorder, not elsewhere classified   . Obstructive sleep apnea (adult) (pediatric)   . GI bleed   . PUD (peptic ulcer disease)   . Clostridium difficile infection   . Anxiety   . Arthritis   . 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 Sarcoid   . Presence of permanent cardiac pacemaker   . AICD (automatic cardioverter/defibrillator) present   . Stroke 2013    tia  . History of hiatal hernia   . GERD (gastroesophageal reflux disease)   . Internal hemorrhoids   . Hiatal hernia   . Radiation 02/03/15-03/10/15    Right Breast    Past Surgical History  Procedure Laterality Date  . Partial hysterectomy      Fibroids  . Cerebral aneurysm repair  02/1999  . Knee arthroscopy      bilateral  . Cardiac defibrillator placement  2006; 2012    BSX single chamber ICD  . Carotid dopplers  2006    neg  .  Hospitalization  2004    GI bleed, PUD, diverticulosis (EGD,colonscopy)  . Hospitalization      PE, NSVT, s/p defib  . Abi  2006    normal  . Left heart catheterization with coronary angiogram N/A 02/22/2012    Procedure: LEFT HEART CATHETERIZATION WITH CORONARY ANGIOGRAM;  Surgeon: Burnell Blanks, MD;  Location: [facility] CATH LAB;  Service: Cardiovascular;  Laterality: N/A;  . Abdominal hysterectomy    . Breast lumpectomy with radioactive seed localization Right 01/03/2015    Procedure: BREAST LUMPECTOMY WITH RADIOACTIVE SEED LOCALIZATION;  Surgeon: Autumn Messing III, MD;  Location: [facility];  Service: General;  Laterality: Right;    There were no vitals filed for this visit.  Visit Diagnosis:  Arthralgia of both knees  Difficulty walking  Joint stiffness of knee, right  Joint stiffness of knee, left      Subjective Assessment - 08/17/15 0941    Subjective Lots of pain this week, I ice my knees down every night.    Currently in Pain? Yes   Pain Score 8    Pain Location Knee   Pain Orientation Right;Left  L>R   Pain Descriptors / Indicators Sore   Pain Type Chronic pain   Pain Onset More than a month ago   Pain Frequency Intermittent  [place] Adult PT Treatment/Exercise - 08/17/15 1315    Knee/Hip Exercises: Stretches   Active Hamstring Stretch 3 reps;30 seconds   Knee/Hip Exercises: Aerobic   Nustep L3 for 7 min ROm and warm up   Knee/Hip Exercises: Seated   Long Arc Quad Strengthening;Both;1 set;20 reps;Weights   Long Arc Quad Weight 4 lbs.   Long Arc [employer] Limitations ball squeeze   Knee/Hip Exercises: Supine   Bridges Strengthening;Both;1 set;10 reps   Straight Leg Raises 2 sets;10 reps   Knee/Hip Exercises: Sidelying   Clams 20 each    Cryotherapy   Number Minutes Cryotherapy 10 Minutes  seated with leg propped   Cryotherapy Location Knee  L   Type of Cryotherapy Ice pack   Ultrasound   Ultrasound Location medial knee, bilat.    Ultrasound  Parameters 1.2 W/cm2 and 1 MHz, 6 min each    Ultrasound Goals Pain                PT Education - 08/17/15 1313    Education provided Yes   Education Details [place], biofreeze, ice pack brand   Person(s) Educated Patient   Methods Explanation   Comprehension Verbalized understanding          PT Short Term Goals - 08/17/15 0952    PT SHORT TERM GOAL #1   Title Pt will be I with basic HEP   Status Achieved   PT SHORT TERM GOAL #2   Title Pt will be able to report 15-25% ess pain, stiffness in knees with normal ADLs.    Status On-going   PT SHORT TERM GOAL #3   Title Pt will be I with concepts of RICE, posture   Status Achieved           PT Long Term Goals - 08/17/15 [id number]    PT LONG TERM GOAL #1   Title Pt will be able to walk/stand in her home for light housework with pain </=6/10 for short periods.    Status On-going   PT LONG TERM GOAL #2   Title Pt will be able to get in and out of car and do basic transfers with no more than min difficulty   Status On-going   PT LONG TERM GOAL #3   Title Pt will be able to participate in church activities more frequently due to improved mobility and confidence.    Status On-going   PT LONG TERM GOAL #4   Title Pt will be I with more advanced HEP for LE strength and ROM as of last visit.    Status On-going   PT LONG TERM GOAL #5   Title FOTO score will decrease to no more than 60% limited to dmeo improvement   Status On-going   PT LONG TERM GOAL #6   Title Pt will enroll in community based exercise plan and report intention of continuing fitness/exercise   Status On-going               Plan - 08/17/15 0950    Clinical Impression Statement Increased pain today, reviewed HEP and provided alternative for hip abd (she stated it makes her sick on her stomach)   PT Next Visit Plan check HEP and progress as tolerated (NO STIM) pacemaker   PT Home Exercise Plan level 1 knee and therband knee flex and ext   Consulted and  Agree with Plan of Care Patient        Problem List Patient Active Problem List  Diagnosis Date Noted  . Globus sensation 05/17/2015  . Productive cough 05/17/2015  . Genetic testing 12/23/2014  . Family history of breast cancer   . Family history of colon cancer   . Family history of pancreatic cancer   . Breast cancer of upper-outer quadrant of right female breast ([place]) 11/23/2014  . Vaginal discharge 10/19/2014  . Trichomonal vaginitis 10/19/2014  . Allergy to ACE inhibitors 09/27/2014  . Encounter for Medicare annual wellness exam 06/27/2014  . Hyperglycemia 06/23/2013  . Routine general medical examination at a health care facility 06/23/2013  . Tongue lesion 02/17/2013  . Dizziness and giddiness 01/16/2013  . Disturbance of skin sensation 01/16/2013  . RLS (restless legs syndrome) 01/15/2013  . Hx of Clostridium difficile infection 10/10/2012  . Blood in stool 07/31/2012  . Bronchiectasis without acute exacerbation ([place]) 04/29/2012  . Dyspnea on exertion 04/02/2012  . Non-ischemic cardiomyopathy ([place]) 03/07/2012  . Colon cancer screening 01/11/2012  . Post-menopausal 01/11/2012  . Chest pain 11/07/2011  . Palpitations 04/17/2011  . Abnormal tympanic membrane 01/22/2011  . Obstructive sleep apnea 04/04/2010  . VENTRICULAR TACHYCARDIA 01/03/2009  . ICD  [employer]  Single chamber 01/03/2009  . PATENT FORAMEN OVALE 09/29/2008  . Vitamin D deficiency 08/17/2008  . Hypothyroidism 11/18/2006  . Hyperlipidemia 11/18/2006  . DEPRESSION 11/18/2006  . Essential hypertension 11/18/2006  . MITRAL VALVE PROLAPSE 11/18/2006  . CEREBRAL ANEURYSM 11/18/2006  . ALLERGIC RHINITIS 11/18/2006  . GERD 11/18/2006  . DIVERTICULOSIS, COLON 11/18/2006  . Fatty liver 11/18/2006  . FIBROMYALGIA 11/18/2006    Orpheus [place] 08/17/2015, 1:18 PM  [facility] [address] [place], [place], [address] Phone: [phone]   Fax:   [phone]  Name: [name] MRN: [id number] Date of Birth: 01-22-1951    Raeford Razor, PT 08/17/2015 1:18 PM Phone: [phone] Fax: [phone]

## 2015-08-18 ENCOUNTER — Ambulatory Visit: Payer: Medicare Other | Admitting: Physical Therapy

## 2015-08-18 DIAGNOSIS — R262 Difficulty in walking, not elsewhere classified: Secondary | ICD-10-CM

## 2015-08-18 DIAGNOSIS — M25562 Pain in left knee: Principal | ICD-10-CM

## 2015-08-18 DIAGNOSIS — M25662 Stiffness of left knee, not elsewhere classified: Secondary | ICD-10-CM

## 2015-08-18 DIAGNOSIS — M25661 Stiffness of right knee, not elsewhere classified: Secondary | ICD-10-CM

## 2015-08-18 DIAGNOSIS — M25561 Pain in right knee: Secondary | ICD-10-CM

## 2015-08-18 NOTE — Therapy (Signed)
[place], [place], [address] Phone: [phone]   Fax:  [phone]  Physical Therapy Treatment  Patient Details  Name: [name] MRN: [id number] Date of Birth: 12/19/1950 Referring Provider: Doyne Keel  Encounter Date: 08/18/2015      PT End of Session - 08/18/15 1552    Visit Number 4   Number of Visits 12   Date for PT Re-Evaluation 09/16/15   PT Start Time 0240   PT Stop Time 1627   PT Time Calculation (min) 42 min   Activity Tolerance Patient tolerated treatment well   Behavior During Therapy [facility] for tasks assessed/performed      Past Medical History  Diagnosis Date  . Paroxysmal ventricular tachycardia   . Ostium secundum type atrial septal defect   . Unspecified transient cerebral ischemia   . Takotsubo syndrome   . Unspecified vitamin D deficiency   . Other pulmonary embolism and infarction   . Other chronic nonalcoholic liver disease   . Cerebral aneurysm, nonruptured   . Hypothyroidism   . Hypertension   . Hyperlipidemia   . Esophageal reflux   . Diverticulosis of colon (without mention of hemorrhage)   . Depressive disorder, not elsewhere classified   . Obstructive sleep apnea (adult) (pediatric)   . GI bleed   . PUD (peptic ulcer disease)   . Clostridium difficile infection   . Anxiety   . Arthritis   . 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 Sarcoid   . Presence of permanent cardiac pacemaker   . AICD (automatic cardioverter/defibrillator) present   . Stroke 2013    tia  . History of hiatal hernia   . GERD (gastroesophageal reflux disease)   . Internal hemorrhoids   . Hiatal hernia   . Radiation 02/03/15-03/10/15    Right Breast    Past Surgical History  Procedure Laterality Date  . Partial hysterectomy      Fibroids  . Cerebral aneurysm repair  02/1999  . Knee arthroscopy      bilateral  . Cardiac defibrillator placement  2006; 2012    BSX single chamber ICD   . Carotid dopplers  2006    neg  . Hospitalization  2004    GI bleed, PUD, diverticulosis (EGD,colonscopy)  . Hospitalization      PE, NSVT, s/p defib  . Abi  2006    normal  . Left heart catheterization with coronary angiogram N/A 02/22/2012    Procedure: LEFT HEART CATHETERIZATION WITH CORONARY ANGIOGRAM;  Surgeon: Burnell Blanks, MD;  Location: [facility] CATH LAB;  Service: Cardiovascular;  Laterality: N/A;  . Abdominal hysterectomy    . Breast lumpectomy with radioactive seed localization Right 01/03/2015    Procedure: BREAST LUMPECTOMY WITH RADIOACTIVE SEED LOCALIZATION;  Surgeon: Autumn Messing III, MD;  Location: [place];  Service: General;  Laterality: Right;    There were no vitals filed for this visit.  Visit Diagnosis:  Arthralgia of both knees  Difficulty walking  Joint stiffness of knee, right  Joint stiffness of knee, left      Subjective Assessment - 08/18/15 1552    Subjective Much better, today, I think it was that rain coming through.  About a 4.10 today L knee, Rt. knee 3/10.             [facility] PT Assessment - 08/18/15 1613    AROM   Right Knee Flexion 95   Left Knee Flexion 115  [place] Adult PT Treatment/Exercise - 08/18/15 1553    Knee/Hip Exercises: Stretches   Active Hamstring Stretch Both;2 reps;30 seconds   Knee: Self-Stretch to increase Flexion Both;3 reps;30 seconds   Other Knee/Hip Stretches ITB with strap 2 x 30 sec    Knee/Hip Exercises: Aerobic   Nustep L3 for 7 min ROm and warm up   Knee/Hip Exercises: Standing   Heel Raises 1 set;20 reps   Hip Abduction Stengthening;Both;2 sets;10 reps;Knee straight   Abduction Limitations red band   Hip Extension Stengthening;Both;2 sets;10 reps;Knee straight   Extension Limitations red band  elbows on counter top   Functional Squat 10 reps   Knee/Hip Exercises: Seated   Hamstring Curl 20 reps   Hamstring Limitations red band   Knee/Hip Exercises: Supine   Short Arc  Quad Sets Strengthening;Both;1 set;20 reps   Short Arc Quad Sets Limitations 4                PT Education - 08/18/15 1635    Education provided No          PT Short Term Goals - 08/17/15 0952    PT SHORT TERM GOAL #1   Title Pt will be I with basic HEP   Status Achieved   PT SHORT TERM GOAL #2   Title Pt will be able to report 15-25% ess pain, stiffness in knees with normal ADLs.    Status On-going   PT SHORT TERM GOAL #3   Title Pt will be I with concepts of RICE, posture   Status Achieved           PT Long Term Goals - 08/17/15 2122    PT LONG TERM GOAL #1   Title Pt will be able to walk/stand in her home for light housework with pain </=6/10 for short periods.    Status On-going   PT LONG TERM GOAL #2   Title Pt will be able to get in and out of car and do basic transfers with no more than min difficulty   Status On-going   PT LONG TERM GOAL #3   Title Pt will be able to participate in church activities more frequently due to improved mobility and confidence.    Status On-going   PT LONG TERM GOAL #4   Title Pt will be I with more advanced HEP for LE strength and ROM as of last visit.    Status On-going   PT LONG TERM GOAL #5   Title FOTO score will decrease to no more than 60% limited to dmeo improvement   Status On-going   PT LONG TERM GOAL #6   Title Pt will enroll in community based exercise plan and report intention of continuing fitness/exercise   Status On-going               Plan - 08/18/15 1635    Clinical Impression Statement Provided patient an altenative to mat hip abd.  Able to tolerate 15-20 min of standing exercises without increased knee pain. AROM still as previous (eval).  No other goals met.    PT Next Visit Plan check HEP and progress as tolerated (NO STIM) pacemaker, try BIKE, give copy of stand hip abd    PT Home Exercise Plan level 1 knee and therband knee flex and ext and standing hip abd (please give next visit)    Consulted and Agree with Plan of Care Patient        Problem List Patient Active Problem List  Diagnosis Date Noted  . Globus sensation 05/17/2015  . Productive cough 05/17/2015  . Genetic testing 12/23/2014  . Family history of breast cancer   . Family history of colon cancer   . Family history of pancreatic cancer   . Breast cancer of upper-outer quadrant of right female breast ([place]) 11/23/2014  . Vaginal discharge 10/19/2014  . Trichomonal vaginitis 10/19/2014  . Allergy to ACE inhibitors 09/27/2014  . Encounter for Medicare annual wellness exam 06/27/2014  . Hyperglycemia 06/23/2013  . Routine general medical examination at a health care facility 06/23/2013  . Tongue lesion 02/17/2013  . Dizziness and giddiness 01/16/2013  . Disturbance of skin sensation 01/16/2013  . RLS (restless legs syndrome) 01/15/2013  . Hx of Clostridium difficile infection 10/10/2012  . Blood in stool 07/31/2012  . Bronchiectasis without acute exacerbation ([place]) 04/29/2012  . Dyspnea on exertion 04/02/2012  . Non-ischemic cardiomyopathy ([place]) 03/07/2012  . Colon cancer screening 01/11/2012  . Post-menopausal 01/11/2012  . Chest pain 11/07/2011  . Palpitations 04/17/2011  . Abnormal tympanic membrane 01/22/2011  . Obstructive sleep apnea 04/04/2010  . VENTRICULAR TACHYCARDIA 01/03/2009  . ICD  [employer]  Single chamber 01/03/2009  . PATENT FORAMEN OVALE 09/29/2008  . Vitamin D deficiency 08/17/2008  . Hypothyroidism 11/18/2006  . Hyperlipidemia 11/18/2006  . DEPRESSION 11/18/2006  . Essential hypertension 11/18/2006  . MITRAL VALVE PROLAPSE 11/18/2006  . CEREBRAL ANEURYSM 11/18/2006  . ALLERGIC RHINITIS 11/18/2006  . GERD 11/18/2006  . DIVERTICULOSIS, COLON 11/18/2006  . Fatty liver 11/18/2006  . FIBROMYALGIA 11/18/2006    PAA,JENNIFER 08/18/2015, 4:38 PM  [facility] [address] [place], [place], [address] Phone:  [phone]   Fax:  [phone]  Name: JIMMYE WISNIESKI MRN: [id number] Date of Birth: [birth year]   Raeford Razor, PT 08/18/2015 4:39 PM Phone: [phone] Fax: [phone]

## 2015-08-21 HISTORY — PX: JOINT REPLACEMENT: SHX530

## 2015-08-23 ENCOUNTER — Ambulatory Visit: Payer: Medicare Other | Attending: Rheumatology

## 2015-08-23 ENCOUNTER — Ambulatory Visit (INDEPENDENT_AMBULATORY_CARE_PROVIDER_SITE_OTHER): Payer: Medicare Other | Admitting: Internal Medicine

## 2015-08-23 ENCOUNTER — Encounter: Payer: Self-pay | Admitting: Internal Medicine

## 2015-08-23 VITALS — BP 116/64 | HR 64 | Ht 66.0 in | Wt 209.4 lb

## 2015-08-23 DIAGNOSIS — I429 Cardiomyopathy, unspecified: Secondary | ICD-10-CM

## 2015-08-23 DIAGNOSIS — I472 Ventricular tachycardia, unspecified: Secondary | ICD-10-CM

## 2015-08-23 DIAGNOSIS — R262 Difficulty in walking, not elsewhere classified: Secondary | ICD-10-CM | POA: Insufficient documentation

## 2015-08-23 DIAGNOSIS — M25662 Stiffness of left knee, not elsewhere classified: Secondary | ICD-10-CM | POA: Insufficient documentation

## 2015-08-23 DIAGNOSIS — M25661 Stiffness of right knee, not elsewhere classified: Secondary | ICD-10-CM | POA: Insufficient documentation

## 2015-08-23 DIAGNOSIS — I428 Other cardiomyopathies: Secondary | ICD-10-CM

## 2015-08-23 DIAGNOSIS — M25562 Pain in left knee: Secondary | ICD-10-CM | POA: Insufficient documentation

## 2015-08-23 DIAGNOSIS — M25561 Pain in right knee: Secondary | ICD-10-CM | POA: Insufficient documentation

## 2015-08-23 LAB — CUP PACEART INCLINIC DEVICE CHECK
HIGH POWER IMPEDANCE MEASURED VALUE: 59 Ohm
HighPow Impedance: 38 Ohm
Implantable Lead Location: 753860
Implantable Lead Model: 185
Lead Channel Impedance Value: 636 Ohm
Lead Channel Pacing Threshold Amplitude: 0.8 V
Lead Channel Pacing Threshold Pulse Width: 0.4 ms
Lead Channel Sensing Intrinsic Amplitude: 11.3 mV
Lead Channel Setting Pacing Amplitude: 2.4 V
MDC IDC LEAD IMPLANT DT: 20050603
MDC IDC LEAD SERIAL: 116340
MDC IDC SESS DTM: 20170103050000
MDC IDC SET LEADCHNL RV PACING PULSEWIDTH: 0.4 ms
MDC IDC SET LEADCHNL RV SENSING SENSITIVITY: 0.4 mV
Pulse Gen Serial Number: 266301

## 2015-08-23 NOTE — Patient Instructions (Signed)
Medication Instructions: - no changes  Labwork: - none  Procedures/Testing: - none  Follow-Up: - Remote monitoring is used to monitor your Pacemaker of ICD from home. This monitoring reduces the number of office visits required to check your device to one time per year. It allows [place] to keep an eye on the functioning of your device to ensure it is working properly. You are scheduled for a device check from home on 11/22/15. You may send your transmission at any time that day. If you have a wireless device, the transmission will be sent automatically. After your physician reviews your transmission, you will receive a postcard with your next transmission date.  - Your physician wants you to follow-up in: 1 year with Dr. Caryl Comes. You will receive a reminder letter in the mail two months in advance. If you don't receive a letter, please call our office to schedule the follow-up appointment.  Any Additional Special Instructions Will Be Listed Below (If Applicable). - none

## 2015-08-23 NOTE — Progress Notes (Signed)
Patient Care Team: Abner Greenspan, MD as PCP - Monsey III, MD as Consulting Physician (General Surgery) Truitt Merle, MD as Consulting Physician (Hematology) Thea Silversmith, MD as Consulting Physician (Radiation Oncology) Rockwell Germany, RN as Registered Nurse Mauro Kaufmann, RN as Registered Nurse Deboraha Sprang, MD as Consulting Physician (Cardiology) Holley Bouche, NP as Nurse Practitioner (Nurse Practitioner) Sylvan Cheese, NP as Nurse Practitioner (Nurse Practitioner)   HPI  [name] is a 65 y.o. 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 This may or may not correlate with dyspnea on exertion. She also has complaints of nocturnal palpitations. Holter monitor was reviewed from 2013 which demonstrated bursts of PVCs occurring not infrequently in the evening hours.    She is now taking carvediol 25 bid with some improvement  She has some exercise fatigue with mild dyspnea; she has significant snoring and has a history of an abnormal sleep study;  She has not tolerated CPAP  2013 echocardiogram demonstrated moderate left ventricular dysfunction with EF of 35-40% new from September 2012;  LHC 7/13 no significant coronary disease and an LVEF of 45%    Past Medical History  Diagnosis Date  . Paroxysmal ventricular tachycardia ([place])   . Ostium secundum type atrial septal defect   . Unspecified transient cerebral ischemia   . Takotsubo syndrome   . Unspecified vitamin D deficiency   . Other pulmonary embolism and infarction   . Other chronic nonalcoholic liver disease   . Cerebral aneurysm, nonruptured   . Hypothyroidism   . Hypertension   . Hyperlipidemia   . Esophageal reflux   . Diverticulosis of colon (without mention of hemorrhage)   . Depressive  disorder, not elsewhere classified   . Obstructive sleep apnea (adult) (pediatric)   . GI bleed   . PUD (peptic ulcer disease)   . Clostridium difficile infection   . Anxiety   . Arthritis   . Breast cancer ([place]) 2016    DCIS ER-/PR-  . Sarcoid ([place])   . Presence of permanent cardiac pacemaker   . AICD (automatic cardioverter/defibrillator) present   . Stroke [facility]) 2013    tia  . History of hiatal hernia   . GERD (gastroesophageal reflux disease)   . Internal hemorrhoids   . Hiatal hernia   . Radiation 02/03/15-03/10/15    Right Breast    Past Surgical History  Procedure Laterality Date  . Partial hysterectomy      Fibroids  . Cerebral aneurysm repair  02/1999  . Knee arthroscopy      bilateral  . Cardiac defibrillator placement  2006; 2012    BSX single chamber ICD  . Carotid dopplers  2006    neg  . Hospitalization  2004    GI bleed, PUD, diverticulosis (EGD,colonscopy)  . Hospitalization      PE, NSVT, s/p defib  . Abi  2006    normal  . Left heart catheterization with coronary angiogram N/A 02/22/2012    Procedure: LEFT HEART CATHETERIZATION WITH CORONARY ANGIOGRAM;  Surgeon: Burnell Blanks, MD;  Location: [facility] CATH LAB;  Service: Cardiovascular;  Laterality: N/A;  . Abdominal hysterectomy    . Breast lumpectomy with radioactive seed localization Right 01/03/2015    Procedure: BREAST LUMPECTOMY WITH RADIOACTIVE SEED LOCALIZATION;  Surgeon:  Autumn Messing III, MD;  Location: [place];  Service: General;  Laterality: Right;    Current Outpatient Prescriptions  Medication Sig Dispense Refill  . acetaminophen (TYLENOL) 500 MG tablet Take 500 mg by mouth every 6 (six) hours as needed (pain).     [name] aspirin 81 MG tablet Take 81 mg by mouth daily.      . carvedilol (COREG) 12.5 MG tablet Take 1.5 tablet (18.75 mg total) by mouth twice daily with a meal.    . cholecalciferol (VITAMIN D) 1000 UNITS tablet Take 2,000 Units by mouth daily.    . fexofenadine (ALLEGRA) 180 MG tablet  Take 180 mg by mouth daily as needed for allergies or rhinitis.    . fluticasone (FLONASE) 50 MCG/ACT nasal spray Place 2 sprays into both nostrils daily as needed for allergies or rhinitis. 48 g 1  . hydrochlorothiazide (HYDRODIURIL) 25 MG tablet Take 1 tablet (25 mg total) by mouth daily. 90 tablet 1  . latanoprost (XALATAN) 0.005 % ophthalmic solution Place 1 drop into both eyes at bedtime.    [name] levothyroxine (SYNTHROID) 50 MCG tablet Take 1 tablet (50 mcg total) by mouth daily. 90 tablet 3  . Multiple Vitamins-Minerals (MULTIVITAMIN PO) Take 1 tablet by mouth daily.    . pantoprazole (PROTONIX) 40 MG tablet TAKE 1 TABLET (40 MG TOTAL) BY MOUTH DAILY. 90 tablet 4  . polyethylene glycol powder (GLYCOLAX/MIRALAX) powder Take 17 grams by mouth daily 255 g 3  . potassium chloride (K-DUR,KLOR-CON) 10 MEQ tablet Take 5 mEq by mouth daily.    . ranitidine (ZANTAC) 150 MG tablet Take 1 tablet (150 mg total) by mouth daily as needed for heartburn. 30 tablet 9  . simvastatin (ZOCOR) 40 MG tablet Take 20 mg by mouth daily.    . traMADol (ULTRAM) 50 MG tablet Take 50 mg by mouth every 8 (eight) hours as needed. (PAIN)    . zolpidem (AMBIEN) 10 MG tablet TAKE ONE TABLET BY MOUTH AT BEDTIME AS NEEDED FOR SLEEP 90 tablet 1  . [DISCONTINUED] potassium chloride (K-DUR) 10 MEQ tablet Take 0.5 tablets (5 mEq total) by mouth daily. 1/2 tab po qd 45 tablet 1   No current facility-administered medications for this visit.    Allergies  Allergen Reactions  . Ace Inhibitors     Angioedema; makes tongue "break out"  . Amitriptyline Hcl     REACTION: ? reaction  . Atorvastatin     REACTION: severe muscle pain  . Cetirizine Hcl     REACTION: sleepy  . Codeine     REACTION: itching  . Paroxetine     REACTION: ? reaction  . Ramipril Other (See Comments)    Tongue ulcers  . Rosuvastatin     REACTION: severe muscle pain  . Carbamazepine Rash    REACTION: ? reaction  . Dilantin [Phenytoin Sodium Extended]  Rash    Review of Systems negative except from HPI and PMH  Physical Exam BP 116/64 mmHg  Pulse 64  Ht 5\' 6"  (1.676 m)  Wt 209 lb 6.4 oz (94.983 kg)  BMI 33.81 kg/m2 Well developed and well nourished in no acute distress HENT normal E scleral and icterus clear Neck Supple JVP flat; carotids brisk and full Clear to ausculation Device pocket well healed; without hematoma or erythema.  There is no tethering  Regular rate and rhythm, no murmurs gallops or rub Soft with active bowel sounds No clubbing cyanosis tr Edema Alert and oriented, grossly normal motor and sensory  function Skin Warm and Dry  ECG SR 65 19  Assessment and  Plan  Nonischemic Cardiomyopathy  CHF  Chronic mixed    Ventricular Tachycardia no intercurrent VT  ACE inhibitor allergy  Palpitations previously identified as PVCs  Hypertension  OSA CPAP  ICD [employer]  The patient's device was interrogated.  The information was reviewed. No changes were made in the programming.    Overall she is doing pretty well  Euvolemic continue current meds  Will give her an example of CPAP tube holder

## 2015-08-25 ENCOUNTER — Ambulatory Visit: Payer: Medicare Other | Admitting: Physical Therapy

## 2015-08-25 DIAGNOSIS — M25662 Stiffness of left knee, not elsewhere classified: Secondary | ICD-10-CM

## 2015-08-25 DIAGNOSIS — M25562 Pain in left knee: Principal | ICD-10-CM

## 2015-08-25 DIAGNOSIS — R262 Difficulty in walking, not elsewhere classified: Secondary | ICD-10-CM | POA: Diagnosis present

## 2015-08-25 DIAGNOSIS — M25661 Stiffness of right knee, not elsewhere classified: Secondary | ICD-10-CM

## 2015-08-25 DIAGNOSIS — M25561 Pain in right knee: Secondary | ICD-10-CM

## 2015-08-25 NOTE — Therapy (Signed)
[place], [place], [address] Phone: [phone]   Fax:  [phone]  Physical Therapy Treatment  Patient Details  Name: [name] MRN: [id number] Date of Birth: June 16, 1951 Referring Provider: Doyne Keel  Encounter Date: 08/25/2015      PT End of Session - 08/25/15 1417    Visit Number 5   Number of Visits 12   Date for PT Re-Evaluation 09/16/15   PT Start Time [id number]   PT Stop Time 1418   PT Time Calculation (min) 43 min   Activity Tolerance Patient tolerated treatment well   Behavior During Therapy [facility] for tasks assessed/performed      Past Medical History  Diagnosis Date  . Paroxysmal ventricular tachycardia ([place])   . Ostium secundum type atrial septal defect   . Unspecified transient cerebral ischemia   . Takotsubo syndrome   . Unspecified vitamin D deficiency   . Other pulmonary embolism and infarction   . Other chronic nonalcoholic liver disease   . Cerebral aneurysm, nonruptured   . Hypothyroidism   . Hypertension   . Hyperlipidemia   . Esophageal reflux   . Diverticulosis of colon (without mention of hemorrhage)   . Depressive disorder, not elsewhere classified   . Obstructive sleep apnea (adult) (pediatric)   . GI bleed   . PUD (peptic ulcer disease)   . Clostridium difficile infection   . Anxiety   . Arthritis   . 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 Sarcoid ([name])   . Presence of permanent cardiac pacemaker   . AICD (automatic cardioverter/defibrillator) present   . Stroke [facility]) 2013    tia  . History of hiatal hernia   . GERD (gastroesophageal reflux disease)   . Internal hemorrhoids   . Hiatal hernia   . Radiation 02/03/15-03/10/15    Right Breast    Past Surgical History  Procedure Laterality Date  . Partial hysterectomy      Fibroids  . Cerebral aneurysm repair  02/1999  . Knee arthroscopy      bilateral  . Cardiac defibrillator placement  2006; 2012   BSX single chamber ICD  . Carotid dopplers  2006    neg  . Hospitalization  2004    GI bleed, PUD, diverticulosis (EGD,colonscopy)  . Hospitalization      PE, NSVT, s/p defib  . Abi  2006    normal  . Left heart catheterization with coronary angiogram N/A 02/22/2012    Procedure: LEFT HEART CATHETERIZATION WITH CORONARY ANGIOGRAM;  Surgeon: Burnell Blanks, MD;  Location: [facility] CATH LAB;  Service: Cardiovascular;  Laterality: N/A;  . Abdominal hysterectomy    . Breast lumpectomy with radioactive seed localization Right 01/03/2015    Procedure: BREAST LUMPECTOMY WITH RADIOACTIVE SEED LOCALIZATION;  Surgeon: Autumn Messing III, MD;  Location: [place];  Service: General;  Laterality: Right;    There were no vitals filed for this visit.  Visit Diagnosis:  Arthralgia of both knees  Difficulty walking  Joint stiffness of knee, right  Joint stiffness of knee, left      Subjective Assessment - 08/25/15 1340    Subjective "the knees have been feeling more sore" she reports that it has been popping more.   Pain Score 8    Pain Orientation Left;Right   Pain Descriptors / Indicators Sore   Pain Type Chronic pain   Pain Onset More than a month ago   Pain Frequency Intermittent  [place] Adult PT Treatment/Exercise - 08/25/15 0001    Knee/Hip Exercises: Aerobic   Nustep L6 x 8 min using both UE/LE   Knee/Hip Exercises: Standing   Hip Abduction Stengthening;Both;2 sets;10 reps;Knee straight   Abduction Limitations red band   Hip Extension Stengthening;Both;2 sets;10 reps;Knee straight   Extension Limitations red band   Knee/Hip Exercises: Seated   Sit to Sand 1 set;10 reps   Knee/Hip Exercises: Supine   Straight Leg Raises 2 sets;10 reps  laying on wedge for her back   Manual Therapy   Manual Therapy Taping   McConnell R knee Medial>Lateral  immediate pain relief                PT Education - 08/25/15 1417    Education provided Yes    Education Details McConnel taping, updated HEP   Person(s) Educated Patient   Methods Explanation   Comprehension Verbalized understanding          PT Short Term Goals - 08/25/15 1420    PT SHORT TERM GOAL #1   Title Pt will be I with basic HEP   Time 2   Status Achieved   PT SHORT TERM GOAL #2   Title Pt will be able to report 15-25% ess pain, stiffness in knees with normal ADLs.    Time 3   Period Weeks   Status On-going   PT SHORT TERM GOAL #3   Title Pt will be I with concepts of RICE, posture   Time 3   Period Weeks   Status Achieved           PT Long Term Goals - 08/25/15 1421    PT LONG TERM GOAL #1   Title Pt will be able to walk/stand in her home for light housework with pain </=6/10 for short periods.    Time 6   Period Weeks   Status On-going   PT LONG TERM GOAL #2   Title Pt will be able to get in and out of car and do basic transfers with no more than min difficulty   Time 6   Period Weeks   Status On-going   PT LONG TERM GOAL #3   Title Pt will be able to participate in church activities more frequently due to improved mobility and confidence.    Time 6   Period Weeks   Status On-going   PT LONG TERM GOAL #4   Title Pt will be I with more advanced HEP for LE strength and ROM as of last visit.    Time 6   Period Weeks   Status On-going   PT LONG TERM GOAL #5   Title FOTO score will decrease to no more than 60% limited to dmeo improvement   Time 6   Period Weeks   Status On-going   PT LONG TERM GOAL #6   Title Pt will enroll in community based exercise plan and report intention of continuing fitness/exercise   Time 6   Period Weeks   Status On-going               Plan - 08/25/15 1418    Clinical Impression Statement [name] reports that she is feeing more sore today which she may attribute to the weather. Attempted Medial to lateral mcConnel tap job and she reported immediate pain relief and was able to perform and complete all of  todays exercises.   PT Next Visit Plan  (NO STIM) pacemaker, try BIKE, hip/ knee  strengthening, for supine exercise use wedge for her back   PT Home Exercise Plan level 1 knee and therband knee flex and ext and standing hip abd    Consulted and Agree with Plan of Care Patient        Problem List Patient Active Problem List   Diagnosis Date Noted  . Globus sensation 05/17/2015  . Productive cough 05/17/2015  . Genetic testing 12/23/2014  . Family history of breast cancer   . Family history of colon cancer   . Family history of pancreatic cancer   . Breast cancer of upper-outer quadrant of right female breast ([place]) 11/23/2014  . Vaginal discharge 10/19/2014  . Trichomonal vaginitis 10/19/2014  . Allergy to ACE inhibitors 09/27/2014  . Encounter for Medicare annual wellness exam 06/27/2014  . Hyperglycemia 06/23/2013  . Routine general medical examination at a health care facility 06/23/2013  . Tongue lesion 02/17/2013  . Dizziness and giddiness 01/16/2013  . Disturbance of skin sensation 01/16/2013  . RLS (restless legs syndrome) 01/15/2013  . Hx of Clostridium difficile infection 10/10/2012  . Blood in stool 07/31/2012  . Bronchiectasis without acute exacerbation ([place]) 04/29/2012  . Dyspnea on exertion 04/02/2012  . Non-ischemic cardiomyopathy ([place]) 03/07/2012  . Colon cancer screening 01/11/2012  . Post-menopausal 01/11/2012  . Chest pain 11/07/2011  . Palpitations 04/17/2011  . Abnormal tympanic membrane 01/22/2011  . Obstructive sleep apnea 04/04/2010  . VENTRICULAR TACHYCARDIA 01/03/2009  . ICD  [employer]  Single chamber 01/03/2009  . PATENT FORAMEN OVALE 09/29/2008  . Vitamin D deficiency 08/17/2008  . Hypothyroidism 11/18/2006  . Hyperlipidemia 11/18/2006  . DEPRESSION 11/18/2006  . Essential hypertension 11/18/2006  . MITRAL VALVE PROLAPSE 11/18/2006  . CEREBRAL ANEURYSM 11/18/2006  . ALLERGIC RHINITIS 11/18/2006  . GERD 11/18/2006  . DIVERTICULOSIS,  COLON 11/18/2006  . Fatty liver 11/18/2006  . FIBROMYALGIA 11/18/2006   Starr Lake PT, DPT, LAT, ATC  08/25/2015  2:34 PM    [facility] [address] [place], [place], [address] Phone: [phone]   Fax:  [phone]  Name: IREANNA TAIWO MRN: [id number] Date of Birth: [birth year]

## 2015-08-25 NOTE — Patient Instructions (Signed)
   Jomari Bartnik PT, DPT, LAT, ATC  [facility] Phone: [phone]     

## 2015-08-30 ENCOUNTER — Ambulatory Visit: Payer: Medicare Other | Admitting: Physical Therapy

## 2015-09-01 ENCOUNTER — Ambulatory Visit: Payer: Medicare Other | Admitting: Physical Therapy

## 2015-09-01 DIAGNOSIS — M25661 Stiffness of right knee, not elsewhere classified: Secondary | ICD-10-CM

## 2015-09-01 DIAGNOSIS — M25561 Pain in right knee: Secondary | ICD-10-CM

## 2015-09-01 DIAGNOSIS — M25662 Stiffness of left knee, not elsewhere classified: Secondary | ICD-10-CM

## 2015-09-01 DIAGNOSIS — R262 Difficulty in walking, not elsewhere classified: Secondary | ICD-10-CM

## 2015-09-01 DIAGNOSIS — M25562 Pain in left knee: Principal | ICD-10-CM

## 2015-09-01 NOTE — Patient Instructions (Signed)
FLEXION: Sitting - Resistance Band (Active)    Sit with right leg extended. Against yellow resistance band, bend knee and draw foot backward. Complete _1 - 3__ sets of 10___ repetitions. Perform 1___ sessions per day.  http://gtsc.exer.us/231   Copyright  VHI. All rights reserved.  Wall Sit: Half (Active)    Back against wall, slide down so knees are halfway to parallel with floor. Hold 1 to 10___ seconds. Rise to an erect position. Complete 1___ sets of __10_ repetitions. Perform _1__ sessions per day.  May have a ball at your back  http://gtsc.exer.us/443   Copyright  VHI. All rights reserved.  Gastroc / Heel Cord Stretch - On Step    Stand with heels over edge of stair. Holding rail, lower heels until stretch is felt in calf of legs.  30 second holds. Repeat __3_ times. Do _1__ times per day.  Copyright  VHI. All rights reserved.

## 2015-09-01 NOTE — Therapy (Signed)
[place], [place], [address] Phone: [phone]   Fax:  [phone]  Physical Therapy Treatment  Patient Details  Name: [name] MRN: [id number] Date of Birth: Dec 25, 1950 Referring Provider: Doyne Keel  Encounter Date: 09/01/2015      PT End of Session - 09/01/15 1157    Visit Number 6   Number of Visits 12   Date for PT Re-Evaluation 09/16/15   PT Start Time 1102   PT Stop Time 1145   PT Time Calculation (min) 43 min   Activity Tolerance Patient tolerated treatment well;No increased pain   Behavior During Therapy [facility] for tasks assessed/performed      Past Medical History  Diagnosis Date  . Paroxysmal ventricular tachycardia ([place])   . Ostium secundum type atrial septal defect   . Unspecified transient cerebral ischemia   . Takotsubo syndrome   . Unspecified vitamin D deficiency   . Other pulmonary embolism and infarction   . Other chronic nonalcoholic liver disease   . Cerebral aneurysm, nonruptured   . Hypothyroidism   . Hypertension   . Hyperlipidemia   . Esophageal reflux   . Diverticulosis of colon (without mention of hemorrhage)   . Depressive disorder, not elsewhere classified   . Obstructive sleep apnea (adult) (pediatric)   . GI bleed   . PUD (peptic ulcer disease)   . Clostridium difficile infection   . Anxiety   . Arthritis   . 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 Sarcoid ([place])   . Presence of permanent cardiac pacemaker   . AICD (automatic cardioverter/defibrillator) present   . Stroke [facility]) 2013    tia  . History of hiatal hernia   . GERD (gastroesophageal reflux disease)   . Internal hemorrhoids   . Hiatal hernia   . Radiation 02/03/15-03/10/15    Right Breast    Past Surgical History  Procedure Laterality Date  . Partial hysterectomy      Fibroids  . Cerebral aneurysm repair  02/1999  . Knee arthroscopy      bilateral  . Cardiac defibrillator placement   2006; 2012    BSX single chamber ICD  . Carotid dopplers  2006    neg  . Hospitalization  2004    GI bleed, PUD, diverticulosis (EGD,colonscopy)  . Hospitalization      PE, NSVT, s/p defib  . Abi  2006    normal  . Left heart catheterization with coronary angiogram N/A 02/22/2012    Procedure: LEFT HEART CATHETERIZATION WITH CORONARY ANGIOGRAM;  Surgeon: Burnell Blanks, MD;  [facility] CATH LAB;  Service: Cardiovascular;  Laterality: N/A;  . Abdominal hysterectomy    . Breast lumpectomy with radioactive seed localization Right 01/03/2015    Procedure: BREAST LUMPECTOMY WITH RADIOACTIVE SEED LOCALIZATION;  Surgeon: Autumn Messing III, MD;  Location: [place];  Service: General;  Laterality: Right;    There were no vitals filed for this visit.  Visit Diagnosis:  Arthralgia of both knees  Difficulty walking  Joint stiffness of knee, right  Joint stiffness of knee, left      Subjective Assessment - 09/01/15 1110    Subjective Tape helped a lot.   6/10.  Doing her exercises a little.     Currently in Pain? Yes   Pain Score 6    Pain Location Knee   Pain Orientation Right;Left   Pain Descriptors / Indicators Sore   Pain Frequency Constant  Aggravating Factors  stairs, change in barometric pressure   Pain Relieving Factors tape                         [facility] Adult PT Treatment/Exercise - 09/01/15 1127    Ambulation/Gait   Gait Comments YOGA walking technique practiced 100 feet,     Knee/Hip Exercises: Stretches   Gastroc Stretch Limitations 3x 30, both on step   Knee/Hip Exercises: Aerobic   Nustep L6, 7 minutes   Knee/Hip Exercises: Standing   Wall Squat 10 reps   Knee/Hip Exercises: Seated   Long Arc Quad AROM;Strengthening;Right;Left;1 set;10 reps   Hamstring Curl 1 set;10 reps;Both   Knee/Hip Exercises: Supine   Quad Sets 10 reps   Manual Therapy   Manual Therapy --  taping medial to lateral                PT Education - 09/01/15 1156     Education provided Yes   Education Details knee exercises   Person(s) Educated Patient   Methods Explanation;Demonstration;Verbal cues;Handout   Comprehension Verbalized understanding;Returned demonstration          PT Short Term Goals - 09/01/15 1201    PT SHORT TERM GOAL #1   Title Pt will be I with basic HEP   Time 2   Period Weeks   Status Achieved   PT SHORT TERM GOAL #2   Title Pt will be able to report 15-25% ess pain, stiffness in knees with normal ADLs.    Baseline pain varies   Time 3   Period Weeks   Status On-going   PT SHORT TERM GOAL #3   Title Pt will be I with concepts of RICE, posture   Time 3   Period Weeks   Status Achieved           PT Long Term Goals - 09/01/15 1202    PT LONG TERM GOAL #1   Title Pt will be able to walk/stand in her home for light housework with pain </=6/10 for short periods.    Time 6   Period Weeks   Status On-going   PT LONG TERM GOAL #2   Title Pt will be able to get in and out of car and do basic transfers with no more than min difficulty   Time 6   Period Weeks   Status On-going   PT LONG TERM GOAL #3   Title Pt will be able to participate in church activities more frequently due to improved mobility and confidence.    Time 6   Period Weeks   Status On-going   PT LONG TERM GOAL #4   Title Pt will be I with more advanced HEP for LE strength and ROM as of last visit.    Time 6   Period Weeks   Status On-going   PT LONG TERM GOAL #5   Title FOTO score will decrease to no more than 60% limited to dmeo improvement   Time 6   Period Weeks   Status On-going               Plan - 09/01/15 1158    Clinical Impression Statement Patient is not doing her home exercises as prescribed. Her pain is decreased with exercise.  Able to make progress toward her home exercise goals. (Able to advance and she has current exercises memorized)   PT Next Visit Plan rewiew new exercises.  Continue to motivate  her to do home  exercises.   PT Home Exercise Plan wall sit, calf stretch, hamstring stretch   Consulted and Agree with Plan of Care Patient        Problem List Patient Active Problem List   Diagnosis Date Noted  . Globus sensation 05/17/2015  . Productive cough 05/17/2015  . Genetic testing 12/23/2014  . Family history of breast cancer   . Family history of colon cancer   . Family history of pancreatic cancer   . Breast cancer of upper-outer quadrant of right female breast ([place]) 11/23/2014  . Vaginal discharge 10/19/2014  . Trichomonal vaginitis 10/19/2014  . Allergy to ACE inhibitors 09/27/2014  . Encounter for Medicare annual wellness exam 06/27/2014  . Hyperglycemia 06/23/2013  . Routine general medical examination at a health care facility 06/23/2013  . Tongue lesion 02/17/2013  . Dizziness and giddiness 01/16/2013  . Disturbance of skin sensation 01/16/2013  . RLS (restless legs syndrome) 01/15/2013  . Hx of Clostridium difficile infection 10/10/2012  . Blood in stool 07/31/2012  . Bronchiectasis without acute exacerbation ([place]) 04/29/2012  . Dyspnea on exertion 04/02/2012  . Non-ischemic cardiomyopathy ([place]) 03/07/2012  . Colon cancer screening 01/11/2012  . Post-menopausal 01/11/2012  . Chest pain 11/07/2011  . Palpitations 04/17/2011  . Abnormal tympanic membrane 01/22/2011  . Obstructive sleep apnea 04/04/2010  . VENTRICULAR TACHYCARDIA 01/03/2009  . ICD  [employer]  Single chamber 01/03/2009  . PATENT FORAMEN OVALE 09/29/2008  . Vitamin D deficiency 08/17/2008  . Hypothyroidism 11/18/2006  . Hyperlipidemia 11/18/2006  . DEPRESSION 11/18/2006  . Essential hypertension 11/18/2006  . MITRAL VALVE PROLAPSE 11/18/2006  . CEREBRAL ANEURYSM 11/18/2006  . ALLERGIC RHINITIS 11/18/2006  . GERD 11/18/2006  . DIVERTICULOSIS, COLON 11/18/2006  . Fatty liver 11/18/2006  . FIBROMYALGIA 11/18/2006    [name] 09/01/2015, 12:12 PM  [facility] [address] [place], [place], [address] Phone: [phone]   Fax:  [phone]  Name: [name] MRN: [id number] Date of Birth: Nov 21, 1950    Melvenia Needles, PTA 09/01/2015 12:12 PM Phone: [phone] Fax: [phone]

## 2015-09-06 ENCOUNTER — Ambulatory Visit: Payer: Medicare Other | Admitting: Physical Therapy

## 2015-09-06 DIAGNOSIS — R262 Difficulty in walking, not elsewhere classified: Secondary | ICD-10-CM

## 2015-09-06 DIAGNOSIS — M25561 Pain in right knee: Secondary | ICD-10-CM | POA: Diagnosis not present

## 2015-09-06 DIAGNOSIS — M25662 Stiffness of left knee, not elsewhere classified: Secondary | ICD-10-CM

## 2015-09-06 DIAGNOSIS — M25562 Pain in left knee: Principal | ICD-10-CM

## 2015-09-06 DIAGNOSIS — M25661 Stiffness of right knee, not elsewhere classified: Secondary | ICD-10-CM

## 2015-09-06 NOTE — Therapy (Signed)
[place], [place], [address] Phone: [phone]   Fax:  [phone]  Physical Therapy Treatment  Patient Details  Name: [name] MRN: [id number] Date of Birth: 1950/10/13 Referring Provider: Doyne Keel  Encounter Date: 09/06/2015      PT End of Session - 09/06/15 1240    Visit Number 7   Number of Visits 12   Date for PT Re-Evaluation 09/16/15   PT Start Time 1155   PT Stop Time 1248   PT Time Calculation (min) 53 min   Activity Tolerance Patient tolerated treatment well   Behavior During Therapy [facility] for tasks assessed/performed      Past Medical History  Diagnosis Date  . Paroxysmal ventricular tachycardia ([place])   . Ostium secundum type atrial septal defect   . Unspecified transient cerebral ischemia   . Takotsubo syndrome   . Unspecified vitamin D deficiency   . Other pulmonary embolism and infarction   . Other chronic nonalcoholic liver disease   . Cerebral aneurysm, nonruptured   . Hypothyroidism   . Hypertension   . Hyperlipidemia   . Esophageal reflux   . Diverticulosis of colon (without mention of hemorrhage)   . Depressive disorder, not elsewhere classified   . Obstructive sleep apnea (adult) (pediatric)   . GI bleed   . PUD (peptic ulcer disease)   . Clostridium difficile infection   . Anxiety   . Arthritis   . 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 Sarcoid ([place])   . Presence of permanent cardiac pacemaker   . AICD (automatic cardioverter/defibrillator) present   . Stroke [facility]) 2013    tia  . History of hiatal hernia   . GERD (gastroesophageal reflux disease)   . Internal hemorrhoids   . Hiatal hernia   . Radiation 02/03/15-03/10/15    Right Breast    Past Surgical History  Procedure Laterality Date  . Partial hysterectomy      Fibroids  . Cerebral aneurysm repair  02/1999  . Knee arthroscopy      bilateral  . Cardiac defibrillator placement  2006; 2012   BSX single chamber ICD  . Carotid dopplers  2006    neg  . Hospitalization  2004    GI bleed, PUD, diverticulosis (EGD,colonscopy)  . Hospitalization      PE, NSVT, s/p defib  . Abi  2006    normal  . Left heart catheterization with coronary angiogram N/A 02/22/2012    Procedure: LEFT HEART CATHETERIZATION WITH CORONARY ANGIOGRAM;  Surgeon: Burnell Blanks, MD;  Location: [facility] CATH LAB;  Service: Cardiovascular;  Laterality: N/A;  . Abdominal hysterectomy    . Breast lumpectomy with radioactive seed localization Right 01/03/2015    Procedure: BREAST LUMPECTOMY WITH RADIOACTIVE SEED LOCALIZATION;  Surgeon: Autumn Messing III, MD;  Location: [place];  Service: General;  Laterality: Right;    There were no vitals filed for this visit.  Visit Diagnosis:  Arthralgia of both knees  Difficulty walking  Joint stiffness of knee, right  Joint stiffness of knee, left      Subjective Assessment - 09/06/15 1200    [facility] ([facility])   Currently in Pain? Yes   Pain Score 6    Pain Location Knee   Pain Orientation Left   Pain Descriptors / Indicators Sore   Pain Type Chronic pain   Pain  Onset More than a month ago   Pain Frequency Constant             [facility] Adult PT Treatment/Exercise - 09/06/15 1213    Knee/Hip Exercises: Aerobic   Nustep L6 UE and LE    Knee/Hip Exercises: Standing   Forward Step Up Both;1 set;20 reps;Hand Hold: 0   Rocker Board 4 minutes   Rocker Board Limitations stretch and controlled balance    Other Standing Knee Exercises 3 way standing hip on foam flex, abd and ext each leg  single leg with 1 UE support   Knee/Hip Exercises: Seated   Sit to Sand 10 reps;without UE support;Other (comment)  squat, progressed to using NO foam  4 sets   Cryotherapy   Number Minutes Cryotherapy 10 Minutes   Cryotherapy Location Knee  L   Type of Cryotherapy Ice pack   Manual Therapy   Manual Therapy Taping    McConnell R knee Medial>Lateral  done bilateral       NuStep for 8 min LE only level 6           PT Education - 09/06/15 1240    Education provided Yes   Education Details sit to stand and strengthening   Person(s) Educated Patient   Methods Explanation   Comprehension Verbalized understanding;Returned demonstration          PT Short Term Goals - 09/01/15 1201    PT SHORT TERM GOAL #1   Title Pt will be I with basic HEP   Time 2   Period Weeks   Status Achieved   PT SHORT TERM GOAL #2   Title Pt will be able to report 15-25% ess pain, stiffness in knees with normal ADLs.    Baseline pain varies   Time 3   Period Weeks   Status On-going   PT SHORT TERM GOAL #3   Title Pt will be I with concepts of RICE, posture   Time 3   Period Weeks   Status Achieved           PT Long Term Goals - 09/01/15 1202    PT LONG TERM GOAL #1   Title Pt will be able to walk/stand in her home for light housework with pain </=6/10 for short periods.    Time 6   Period Weeks   Status On-going   PT LONG TERM GOAL #2   Title Pt will be able to get in and out of car and do basic transfers with no more than min difficulty   Time 6   Period Weeks   Status On-going   PT LONG TERM GOAL #3   Title Pt will be able to participate in church activities more frequently due to improved mobility and confidence.    Time 6   Period Weeks   Status On-going   PT LONG TERM GOAL #4   Title Pt will be I with more advanced HEP for LE strength and ROM as of last visit.    Time 6   Period Weeks   Status On-going   PT LONG TERM GOAL #5   Title FOTO score will decrease to no more than 60% limited to dmeo improvement   Time 6   Period Weeks   Status On-going               Plan - 09/06/15 1259    Clinical Impression Statement Patient still with difficulty and frustration about knee with sit to stand (  toileting) and negotiating stairs.  We worked on those activities today within limit  of pain. Tanyiah lives with her granddaughter who has some mental/social challenges, she stays very busy with her and is difficult to find time to exercise.  May choose to do the [facility] for continued ex (warm water pool?) 1 more visit   PT Next Visit Plan review full home routine, re-tape and show her how to do it, measure, FOTO and gcode   PT Home Exercise Plan wall sit, calf stretch, hamstring stretch   Consulted and Agree with Plan of Care Patient        Problem List Patient Active Problem List   Diagnosis Date Noted  . Globus sensation 05/17/2015  . Productive cough 05/17/2015  . Genetic testing 12/23/2014  . Family history of breast cancer   . Family history of colon cancer   . Family history of pancreatic cancer   . Breast cancer of upper-outer quadrant of right female breast ([place]) 11/23/2014  . Vaginal discharge 10/19/2014  . Trichomonal vaginitis 10/19/2014  . Allergy to ACE inhibitors 09/27/2014  . Encounter for Medicare annual wellness exam 06/27/2014  . Hyperglycemia 06/23/2013  . Routine general medical examination at a health care facility 06/23/2013  . Tongue lesion 02/17/2013  . Dizziness and giddiness 01/16/2013  . Disturbance of skin sensation 01/16/2013  . RLS (restless legs syndrome) 01/15/2013  . Hx of Clostridium difficile infection 10/10/2012  . Blood in stool 07/31/2012  . Bronchiectasis without acute exacerbation ([place]) 04/29/2012  . Dyspnea on exertion 04/02/2012  . Non-ischemic cardiomyopathy ([place]) 03/07/2012  . Colon cancer screening 01/11/2012  . Post-menopausal 01/11/2012  . Chest pain 11/07/2011  . Palpitations 04/17/2011  . Abnormal tympanic membrane 01/22/2011  . Obstructive sleep apnea 04/04/2010  . VENTRICULAR TACHYCARDIA 01/03/2009  . ICD  [employer]  Single chamber 01/03/2009  . PATENT FORAMEN OVALE 09/29/2008  . Vitamin D deficiency 08/17/2008  . Hypothyroidism 11/18/2006  . Hyperlipidemia 11/18/2006  . DEPRESSION 11/18/2006  .  Essential hypertension 11/18/2006  . MITRAL VALVE PROLAPSE 11/18/2006  . CEREBRAL ANEURYSM 11/18/2006  . ALLERGIC RHINITIS 11/18/2006  . GERD 11/18/2006  . DIVERTICULOSIS, COLON 11/18/2006  . Fatty liver 11/18/2006  . FIBROMYALGIA 11/18/2006    Shavana Calder 09/06/2015, 1:02 PM  [facility] [address] [place], [place], [address] Phone: [phone]   Fax:  [phone]  Name: DAIVA SINER MRN: [id number] Date of Birth: 07/24/1951  Raeford Razor, PT 09/06/2015 1:03 PM Phone: [phone] Fax: [phone]

## 2015-09-08 ENCOUNTER — Ambulatory Visit: Payer: Medicare Other | Admitting: Physical Therapy

## 2015-09-08 DIAGNOSIS — M25561 Pain in right knee: Secondary | ICD-10-CM

## 2015-09-08 DIAGNOSIS — M25661 Stiffness of right knee, not elsewhere classified: Secondary | ICD-10-CM

## 2015-09-08 DIAGNOSIS — R262 Difficulty in walking, not elsewhere classified: Secondary | ICD-10-CM

## 2015-09-08 DIAGNOSIS — M25562 Pain in left knee: Principal | ICD-10-CM

## 2015-09-08 DIAGNOSIS — M25662 Stiffness of left knee, not elsewhere classified: Secondary | ICD-10-CM

## 2015-09-08 NOTE — Patient Instructions (Signed)
[place]    Lie on back, legs bent. Press hips up and rest on shoulder blades. Hold _10__ seconds, at highest position. Gently roll down one vertebrae at a time to starting position.  Repeat _10__ times per session. Do __2 sessions per day.  Copyright  VHI. All rights reserved.

## 2015-09-08 NOTE — Therapy (Signed)
[place], [place], [address] Phone: [phone]   Fax:  [phone]  Physical Therapy Treatment  Patient Details  Name: [name] MRN: [id number] Date of Birth: Jan 24, 1951 Referring Provider: Doyne Keel  Encounter Date: 09/08/2015      PT End of Session - 09/08/15 1218    Visit Number 8   Number of Visits 12   Date for PT Re-Evaluation 09/16/15   PT Start Time 1975   PT Stop Time 1233   PT Time Calculation (min) 39 min   Activity Tolerance Patient tolerated treatment well   Behavior During Therapy [facility] for tasks assessed/performed      Past Medical History  Diagnosis Date  . Paroxysmal ventricular tachycardia ([place])   . Ostium secundum type atrial septal defect   . Unspecified transient cerebral ischemia   . Takotsubo syndrome   . Unspecified vitamin D deficiency   . Other pulmonary embolism and infarction   . Other chronic nonalcoholic liver disease   . Cerebral aneurysm, nonruptured   . Hypothyroidism   . Hypertension   . Hyperlipidemia   . Esophageal reflux   . Diverticulosis of colon (without mention of hemorrhage)   . Depressive disorder, not elsewhere classified   . Obstructive sleep apnea (adult) (pediatric)   . GI bleed   . PUD (peptic ulcer disease)   . Clostridium difficile infection   . Anxiety   . Arthritis   . 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 Sarcoid ([place])   . Presence of permanent cardiac pacemaker   . AICD (automatic cardioverter/defibrillator) present   . Stroke [facility]) 2013    tia  . History of hiatal hernia   . GERD (gastroesophageal reflux disease)   . Internal hemorrhoids   . Hiatal hernia   . Radiation 02/03/15-03/10/15    Right Breast    Past Surgical History  Procedure Laterality Date  . Partial hysterectomy      Fibroids  . Cerebral aneurysm repair  02/1999  . Knee arthroscopy      bilateral  . Cardiac defibrillator placement  2006; 2012   BSX single chamber ICD  . Carotid dopplers  2006    neg  . Hospitalization  2004    GI bleed, PUD, diverticulosis (EGD,colonscopy)  . Hospitalization      PE, NSVT, s/p defib  . Abi  2006    normal  . Left heart catheterization with coronary angiogram N/A 02/22/2012    Procedure: LEFT HEART CATHETERIZATION WITH CORONARY ANGIOGRAM;  Surgeon: Burnell Blanks, MD;  [facility] C[facility] LAB;  Service: Cardiovascular;  Laterality: N/A;  . Abdominal hysterectomy    . Breast lumpectomy with radioactive seed localization Right 01/03/2015    Procedure: BREAST LUMPECTOMY WITH RADIOACTIVE SEED LOCALIZATION;  Surgeon: Autumn Messing III, MD;  Location: [place];  Service: General;  Laterality: Right;    There were no vitals filed for this visit.  Visit Diagnosis:  Arthralgia of both knees  Difficulty walking  Joint stiffness of knee, right  Joint stiffness of knee, left      Subjective Assessment - 09/08/15 1157    Subjective I brought me cane because I've been shopping (3 hrs).     Currently in Pain? Yes   Pain Score 3    Pain Location Knee   Pain Orientation Left   Pain Descriptors / Indicators Sore   Pain Type Chronic pain   Pain Onset More than a month  ago   Pain Frequency Constant            [facility] PT Assessment - 09/08/15 1201    AROM   Right Knee Extension 2   Right Knee Flexion 95   Left Knee Extension 5   Left Knee Flexion 118   Strength   Right Hip Flexion 4+/5   Left Hip Flexion 4/5   Right Knee Flexion 4+/5   Right Knee Extension 4+/5  feels weaker in single leg stance   Left Knee Flexion 4+/5   Left Knee Extension 4+/5               [facility] Adult PT Treatment/Exercise - 09/08/15 1204    Knee/Hip Exercises: Stretches   Active Hamstring Stretch Both;2 reps;30 seconds   Active Hamstring Stretch Limitations HEP    Gastroc Stretch 3 reps;30 seconds   Gastroc Stretch Limitations off step    Knee/Hip Exercises: Aerobic   Nustep L7, UE and LE for 6 min     Knee/Hip Exercises: Standing   Hip Abduction Stengthening;Both;1 set;10 reps   Hip Extension Stengthening;Both;1 set;10 reps   Forward Step Up Both;1 set;10 reps;Hand Hold: 2;Step Height: 8"   Knee/Hip Exercises: Supine   Quad Sets Strengthening;Both;1 set;10 reps   Quad Sets Limitations HEP   Heel Slides AAROM;Both;1 set;10 reps   Heel Slides Limitations HEP   Bridges Strengthening;Both;1 set;20 reps   Bridges Limitations HEP   Straight Leg Raises Strengthening;Both;1 set;10 reps   Straight Leg Raises Limitations HEP   Straight Leg Raise with External Rotation Strengthening;Both;1 set;10 reps   Straight Leg Raise with External Rotation Limitations HEP                 PT Education - 09/08/15 1231    Education provided Yes   Education Details DC HEP, stair negotiation and problem solving if she has TKR (7 STE)   Person(s) Educated Patient   Methods Explanation   Comprehension Verbalized understanding;Returned demonstration          PT Short Term Goals - 09/08/15 1246    PT SHORT TERM GOAL #1   Title Pt will be I with basic HEP   Status Achieved   PT SHORT TERM GOAL #2   Title Pt will be able to report 15-25% ess pain, stiffness in knees with normal ADLs.    Status Partially Met   PT SHORT TERM GOAL #3   Title Pt will be I with concepts of RICE, posture   Status Achieved           PT Long Term Goals - 09/08/15 1246    PT LONG TERM GOAL #1   Title Pt will be able to walk/stand in her home for light housework with pain </=6/10 for short periods.    Status Achieved   PT LONG TERM GOAL #2   Title Pt will be able to get in and out of car and do basic transfers with no more than min difficulty   Status Partially Met   PT LONG TERM GOAL #3   Title Pt will be able to participate in church activities more frequently due to improved mobility and confidence.    Status Partially Met   PT LONG TERM GOAL #4   Title Pt will be I with more advanced HEP for LE strength  and ROM as of last visit.    Status Achieved   PT LONG TERM GOAL #5   Title FOTO score will decrease to  no more than 60% limited to dmeo improvement   Baseline 74%   Status Not Met   PT LONG TERM GOAL #6   Title Pt will enroll in community based exercise plan and report intention of continuing fitness/exercise   Status Not Met               Plan - 10-03-2015 1235    Clinical Impression Statement Patient requests DC today.  She feels she has the tools she needs to do HEP and may attend [facility] or [employer] for pool training.  She cont with pain in L knee and feelings of instability in stance.  Plans to buy tape and said she didnt need review today.    PT Next Visit Plan NA   PT Home Exercise Plan I with HEP   Consulted and Agree with Plan of Care Patient          G-Codes - 2015-10-03 1249    Functional Assessment Tool Used FOTO   Functional Limitation Mobility: Walking and moving around   Mobility: Walking and Moving Around Current Status [phone]) At least 60 percent but less than 80 percent impaired, limited or restricted   Mobility: Walking and Moving Around Goal Status [phone]) At least 40 percent but less than 60 percent impaired, limited or restricted   Mobility: Walking and Moving Around Discharge Status [phone]) At least 60 percent but less than 80 percent impaired, limited or restricted      Problem List Patient Active Problem List   Diagnosis Date Noted  . Globus sensation 05/17/2015  . Productive cough 05/17/2015  . Genetic testing 12/23/2014  . Family history of breast cancer   . Family history of colon cancer   . Family history of pancreatic cancer   . Breast cancer of upper-outer quadrant of right female breast ([place]) 11/23/2014  . Vaginal discharge 10/19/2014  . Trichomonal vaginitis 10/19/2014  . Allergy to ACE inhibitors 09/27/2014  . Encounter for Medicare annual wellness exam 06/27/2014  . Hyperglycemia 06/23/2013  . Routine general medical examination at a health  care facility 06/23/2013  . Tongue lesion 02/17/2013  . Dizziness and giddiness 01/16/2013  . Disturbance of skin sensation 01/16/2013  . RLS (restless legs syndrome) 01/15/2013  . Hx of Clostridium difficile infection 10/10/2012  . Blood in stool 07/31/2012  . Bronchiectasis without acute exacerbation ([place]) 04/29/2012  . Dyspnea on exertion 04/02/2012  . Non-ischemic cardiomyopathy ([place]) 03/07/2012  . Colon cancer screening 01/11/2012  . Post-menopausal 01/11/2012  . Chest pain 11/07/2011  . Palpitations 04/17/2011  . Abnormal tympanic membrane 01/22/2011  . Obstructive sleep apnea 04/04/2010  . VENTRICULAR TACHYCARDIA 01/03/2009  . ICD  [employer]  Single chamber 01/03/2009  . PATENT FORAMEN OVALE 09/29/2008  . Vitamin D deficiency 08/17/2008  . Hypothyroidism 11/18/2006  . Hyperlipidemia 11/18/2006  . DEPRESSION 11/18/2006  . Essential hypertension 11/18/2006  . MITRAL VALVE PROLAPSE 11/18/2006  . CEREBRAL ANEURYSM 11/18/2006  . ALLERGIC RHINITIS 11/18/2006  . GERD 11/18/2006  . DIVERTICULOSIS, COLON 11/18/2006  . Fatty liver 11/18/2006  . FIBROMYALGIA 11/18/2006    Vyron Fronczak October 03, 2015, 12:51 PM  [facility] [address] [place], [place], [address] Phone: [phone]   Fax:  [phone]  Name: CLOTINE HEINER MRN: [id number] Date of Birth: 21-May-1951  PHYSICAL THERAPY DISCHARGE SUMMARY  Visits from Start of Care: 8  Current functional level related to goals / functional outcomes: See above for goals.    Remaining deficits: Pain, strength, flexibility/ROM and  endurance    Education / Equipment: HEP, exercise options post DC, prep for possible TKR, expectations.  Plan: Patient agrees to discharge.  Patient goals were partially met. Patient is being discharged due to the patient's request.  ?????    Pt improved only 2% on FOTO.   Raeford Razor, PT 09/08/2015 12:52 PM Phone: [phone] Fax:  [phone]

## 2015-09-21 ENCOUNTER — Encounter: Payer: Self-pay | Admitting: Adult Health

## 2015-09-21 ENCOUNTER — Other Ambulatory Visit: Payer: Self-pay | Admitting: Internal Medicine

## 2015-09-21 NOTE — Progress Notes (Signed)
A birthday card was mailed to the patient today on behalf of the Survivorship Program at [facility].   Sherlyne Crownover, NP Survivorship Program [facility] [phone]  

## 2015-09-22 NOTE — Telephone Encounter (Signed)
Please advise on refill as it does not look like Dr Caryl Comes manages patients lipids. Thanks, MI

## 2015-09-23 NOTE — Telephone Encounter (Signed)
You are correct.  Please have patient contact whomever manages lipids for refills.  Thanks

## 2015-10-11 ENCOUNTER — Other Ambulatory Visit: Payer: Self-pay

## 2015-10-11 MED ORDER — SIMVASTATIN 40 MG PO TABS: 20.0000 mg | ORAL_TABLET | Freq: Every day | ORAL | Status: DC

## 2015-10-21 ENCOUNTER — Encounter: Payer: Self-pay | Admitting: Internal Medicine

## 2015-10-25 ENCOUNTER — Encounter: Payer: Self-pay | Admitting: Pulmonary Disease

## 2015-10-25 ENCOUNTER — Other Ambulatory Visit (INDEPENDENT_AMBULATORY_CARE_PROVIDER_SITE_OTHER): Payer: Medicare Other

## 2015-10-25 ENCOUNTER — Ambulatory Visit (INDEPENDENT_AMBULATORY_CARE_PROVIDER_SITE_OTHER): Payer: Medicare Other | Admitting: Pulmonary Disease

## 2015-10-25 ENCOUNTER — Ambulatory Visit: Payer: Medicare Other | Admitting: Pulmonary Disease

## 2015-10-25 VITALS — BP 108/72 | HR 114 | Ht 66.5 in | Wt 202.4 lb

## 2015-10-25 DIAGNOSIS — R002 Palpitations: Secondary | ICD-10-CM

## 2015-10-25 DIAGNOSIS — J479 Bronchiectasis, uncomplicated: Secondary | ICD-10-CM

## 2015-10-25 DIAGNOSIS — G4733 Obstructive sleep apnea (adult) (pediatric): Secondary | ICD-10-CM

## 2015-10-25 LAB — CBC WITH DIFFERENTIAL/PLATELET
BASOS PCT: 0.4 % (ref 0.0–3.0)
Basophils Absolute: 0 10*3/uL (ref 0.0–0.1)
EOS PCT: 3.7 % (ref 0.0–5.0)
Eosinophils Absolute: 0.2 10*3/uL (ref 0.0–0.7)
HEMATOCRIT: 38.4 % (ref 36.0–46.0)
HEMOGLOBIN: 12.8 g/dL (ref 12.0–15.0)
LYMPHS PCT: 14.6 % (ref 12.0–46.0)
Lymphs Abs: 0.7 10*3/uL (ref 0.7–4.0)
MCHC: 33.2 g/dL (ref 30.0–36.0)
MCV: 86.6 fl (ref 78.0–100.0)
MONO ABS: 0.5 10*3/uL (ref 0.1–1.0)
Monocytes Relative: 10.3 % (ref 3.0–12.0)
NEUTROS ABS: 3.5 10*3/uL (ref 1.4–7.7)
Neutrophils Relative %: 71 % (ref 43.0–77.0)
Platelets: 262 10*3/uL (ref 150.0–400.0)
RBC: 4.44 Mil/uL (ref 3.87–5.11)
RDW: 12.4 % (ref 11.5–15.5)
WBC: 4.9 10*3/uL (ref 4.0–10.5)

## 2015-10-25 LAB — COMPREHENSIVE METABOLIC PANEL
ALBUMIN: 4.3 g/dL (ref 3.5–5.2)
ALT: 22 U/L (ref 0–35)
AST: 27 U/L (ref 0–37)
Alkaline Phosphatase: 141 U/L — ABNORMAL HIGH (ref 39–117)
BUN: 11 mg/dL (ref 6–23)
CALCIUM: 10.3 mg/dL (ref 8.4–10.5)
CHLORIDE: 104 meq/L (ref 96–112)
CO2: 28 mEq/L (ref 19–32)
Creatinine, Ser: 0.83 mg/dL (ref 0.40–1.20)
GFR: 88.17 mL/min (ref >60.00)
Glucose, Bld: 118 mg/dL — ABNORMAL HIGH (ref 70–99)
POTASSIUM: 4.4 meq/L (ref 3.5–5.1)
Sodium: 142 mEq/L (ref 135–145)
Total Bilirubin: 0.4 mg/dL (ref 0.2–1.2)
Total Protein: 7.9 g/dL (ref 6.0–8.3)

## 2015-10-25 LAB — TSH: TSH: 1.5 u[IU]/mL (ref 0.35–4.50)

## 2015-10-25 LAB — SEDIMENTATION RATE: SED RATE: 40 mm/h — AB (ref 0–22)

## 2015-10-25 NOTE — Patient Instructions (Addendum)
Will arrange for home sleep study and call with results  Lab tests today  Follow up in 6 months

## 2015-10-25 NOTE — Progress Notes (Signed)
Current Outpatient Prescriptions on File Prior to Visit  Medication Sig  . acetaminophen (TYLENOL) 500 MG tablet Take 500 mg by mouth every 6 (six) hours as needed (pain).   [name] aspirin 81 MG tablet Take 81 mg by mouth daily.    . carvedilol (COREG) 12.5 MG tablet Take 1.5 tablet (18.75 mg total) by mouth twice daily with a meal.  . cholecalciferol (VITAMIN D) 1000 UNITS tablet Take 2,000 Units by mouth daily.  . fexofenadine (ALLEGRA) 180 MG tablet Take 180 mg by mouth daily as needed for allergies or rhinitis.  . fluticasone (FLONASE) 50 MCG/ACT nasal spray Place 2 sprays into both nostrils daily as needed for allergies or rhinitis.  . hydrochlorothiazide (HYDRODIURIL) 25 MG tablet Take 1 tablet (25 mg total) by mouth daily.  [name] latanoprost (XALATAN) 0.005 % ophthalmic solution Place 1 drop into both eyes at bedtime.  [name] levothyroxine (SYNTHROID) 50 MCG tablet Take 1 tablet (50 mcg total) by mouth daily.  . Multiple Vitamins-Minerals (MULTIVITAMIN PO) Take 1 tablet by mouth daily.  . pantoprazole (PROTONIX) 40 MG tablet TAKE 1 TABLET (40 MG TOTAL) BY MOUTH DAILY.  [name] polyethylene glycol powder (GLYCOLAX/MIRALAX) powder Take 17 grams by mouth daily  . potassium chloride (K-DUR,KLOR-CON) 10 MEQ tablet Take 5 mEq by mouth daily.  . ranitidine (ZANTAC) 150 MG tablet Take 1 tablet (150 mg total) by mouth daily as needed for heartburn.  . simvastatin (ZOCOR) 40 MG tablet Take 0.5 tablets (20 mg total) by mouth daily.  . traMADol (ULTRAM) 50 MG tablet Take 50 mg by mouth every 8 (eight) hours as needed. (PAIN)  . zolpidem (AMBIEN) 10 MG tablet TAKE ONE TABLET BY MOUTH AT BEDTIME AS NEEDED FOR SLEEP  . [DISCONTINUED] potassium chloride (K-DUR) 10 MEQ tablet Take 0.5 tablets (5 mEq total) by mouth daily. 1/2 tab po qd   No current facility-administered medications on file prior to visit.     Chief Complaint  Patient presents with  . Follow-up    Former [place] pt for OSA/RLS: Pt returned CPAP machine 10  months ago. Discuss retesting for OSA     Tests PSG 11/29/08 >> AHI 9 CT chest 04/10/12 >> b/l lower lobe cylindrical BTX and some in upper lobes PFT 04/17/12 >> FEV1 2.60 (110%), FEV1% 85, TLC 4.05 (77%), DLCO 44% Echo 12/07/14 >> EF 50 to 32%, grade 1 diastolic dysfx, PAS 36 mmHg  Past medical hx VT s/p AICD, ASD, CVA, Tako tsubo CM, PE 2005, NASH, Hypothyroidism, GERD, Diverticulosis, Depression, C diff, Anxiety, Breast cancer 2016, Nummular eczema  Past surgical hx, Allergies, Family hx, Social hx all reviewed.  Vital Signs BP 108/72 mmHg  Pulse 114  Ht 5' 6.5" (1.689 m)  Wt 202 lb 6.4 oz (91.808 kg)  BMI 32.18 kg/m2  SpO2 98%  History of Present Illness [name] is a 65 y.o. female former smoker with bronchiectasis and obstructive sleep apnea.  She reports having a terrible episode of the flu and pneumonia as a child.  She was also told she had sarcoidosis, but never had biopsy to prove this.  She has been followed by dermatology for skin rash, but never told she had lupus.  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    She is not having cough, wheeze, sputum, fever, or hemoptysis.  She does not normal feel like her breathing limits her activity.  She turned her CPAP in.  She didn't feel like it was helping her sleep.  She would get tangled  in tubing.  She was told by cardiology that she should reconsider using CPAP therapy.  She has noticed feeling palpitations more over the past day.  She says her heart rate is normal in the 60's and 70's.    Physical Exam  General - No distress ENT - No sinus tenderness, no oral exudate, no LAN Cardiac - s1s2 regular, tachycardic, no murmur Chest - No wheeze/rales/dullness Back - No focal tenderness Abd - Soft, non-tender Ext - No edema Neuro - Normal strength Skin - No rashes Psych - normal mood, and behavior   Assessment/Plan  Obstructive sleep apnea. Plan: - will arrange for home sleep study to assess current status of her  sleep apnea, and then determine if she should reconsider therapy >> if she is not able to tolerated CPAP, then oral appliance might be option for her  Bronchiectasis. It is not clear what triggered this.  There is questionable history of sarcoidosis, but this is not certain (no tissue sampling ever done). Plan: - defer further testing at this time since she has minimal respiratory symptoms  Palpitations. ECG shows sinus tachycardia and new 1st degree block. Plan: - d/w Dr. Caryl Comes with cardiology >> his office will schedule f/u - will check CBC, CMET, ESR, ACE, TSH - will check D dimer >> if elevated, then will need CT angiogram chest to assess for PE   Patient Instructions  Will arrange for home sleep study and call with results  Lab tests today  Follow up in 6 months     Chesley Mires, MD  [facility] Pager:  [phone]

## 2015-10-26 ENCOUNTER — Encounter: Payer: Self-pay | Admitting: Internal Medicine

## 2015-10-26 ENCOUNTER — Ambulatory Visit (INDEPENDENT_AMBULATORY_CARE_PROVIDER_SITE_OTHER): Payer: Medicare Other | Admitting: Internal Medicine

## 2015-10-26 ENCOUNTER — Telehealth: Payer: Self-pay | Admitting: Pulmonary Disease

## 2015-10-26 ENCOUNTER — Ambulatory Visit (INDEPENDENT_AMBULATORY_CARE_PROVIDER_SITE_OTHER)
Admission: RE | Admit: 2015-10-26 | Discharge: 2015-10-26 | Disposition: A | Discharge status: Home / Self Care | Payer: Medicare Other | Source: Ambulatory Visit | Attending: Pulmonary Disease | Admitting: Pulmonary Disease

## 2015-10-26 VITALS — BP 136/78 | HR 78 | Ht 66.5 in | Wt 201.8 lb

## 2015-10-26 DIAGNOSIS — I429 Cardiomyopathy, unspecified: Secondary | ICD-10-CM

## 2015-10-26 DIAGNOSIS — Z86711 Personal history of pulmonary embolism: Secondary | ICD-10-CM

## 2015-10-26 DIAGNOSIS — Z95810 Presence of automatic (implantable) cardiac defibrillator: Secondary | ICD-10-CM

## 2015-10-26 DIAGNOSIS — R000 Tachycardia, unspecified: Secondary | ICD-10-CM

## 2015-10-26 DIAGNOSIS — I472 Ventricular tachycardia, unspecified: Secondary | ICD-10-CM

## 2015-10-26 DIAGNOSIS — R002 Palpitations: Secondary | ICD-10-CM

## 2015-10-26 DIAGNOSIS — I5042 Chronic combined systolic (congestive) and diastolic (congestive) heart failure: Secondary | ICD-10-CM | POA: Diagnosis not present

## 2015-10-26 DIAGNOSIS — I428 Other cardiomyopathies: Secondary | ICD-10-CM

## 2015-10-26 LAB — D-DIMER, QUANTITATIVE (NOT AT ARMC): D-DIMER: 2.64 mg{FEU}/L — AB (ref 0.00–0.49)

## 2015-10-26 LAB — ANGIOTENSIN CONVERTING ENZYME: ANGIOTENSIN-CONVERTING ENZYME: 32 U/L (ref 8–52)

## 2015-10-26 MED ORDER — METOPROLOL SUCCINATE ER 50 MG PO TB24: 50.0000 mg | ORAL_TABLET | Freq: Every day | ORAL | Status: DC

## 2015-10-26 MED ORDER — IOHEXOL 350 MG/ML SOLN
80.0000 mL | Freq: Once | INTRAVENOUS | Status: AC | PRN
  Administered 2015-10-26: 80 mL via INTRAVENOUS

## 2015-10-26 NOTE — Patient Instructions (Signed)
Medication Instructions: 1) Stop coreg (carvedilol) 2) Start toprol (metoprolol succinate) 50 mg once daily  Labwork: - none  Procedures/Testing: - none  Follow-Up: - Your physician wants you to follow-up in: January 2018 You will receive a reminder letter in the mail two months in advance. If you don't receive a letter, please call our office to schedule the follow-up appointment.   Any Additional Special Instructions Will Be Listed Below (If Applicable).     If you need a refill on your cardiac medications before your next appointment, please call your pharmacy.

## 2015-10-26 NOTE — Telephone Encounter (Signed)
CMP Latest Ref Rng 10/25/2015 12/29/2014 12/07/2014  Glucose 70 - 99 mg/dL 118(H) 109(H) 117(H)  BUN 6 - 23 mg/dL 11 9 11   Creatinine 0.40 - 1.20 mg/dL 0.83 0.88 0.77  Sodium 135 - 145 mEq/L 142 139 137  Potassium 3.5 - 5.1 mEq/L 4.4 3.5 3.8  Chloride 96 - 112 mEq/L 104 104 104  CO2 19 - 32 mEq/L 28 29 28   Calcium 8.4 - 10.5 mg/dL 10.3 9.9 9.9  Total Protein 6.0 - 8.3 g/dL 7.9 7.3 -  Total Bilirubin 0.2 - 1.2 mg/dL 0.4 0.5 -  Alkaline Phos 39 - 117 U/L 141(H) 111 -  AST 0 - 37 U/L 27 33 -  ALT 0 - 35 U/L 22 29 -    CBC Latest Ref Rng 10/25/2015 12/29/2014 12/01/2014  WBC 4.0 - 10.5 K/uL 4.9 4.1 3.9  Hemoglobin 12.0 - 15.0 g/dL 12.8 12.6 12.4  Hematocrit 36.0 - 46.0 % 38.4 39.7 38.8  Platelets 150.0 - 400.0 K/uL 262.0 215 213    Lab Results  Component Value Date   DDIMER 2.64* 10/25/2015   Lab Results  Component Value Date   ESRSEDRATE 40* 10/25/2015   Lab Results  Component Value Date   TSH 1.50 10/25/2015   Results d/w pt over the phone.   Will arrange for CT angiogram chest to assess for pulmonary embolism.

## 2015-10-26 NOTE — Telephone Encounter (Signed)
CT angio chest 10/26/15 >> patchy GGO in periphery of lungs b/l, basilar BTX, no PE; no significant change compared to 2013  D/w pt.  No change to pulmonary plan.  She was seen by cardiology earlier today, and had medications changed.

## 2015-10-26 NOTE — Progress Notes (Signed)
Patient Care Team: Abner Greenspan, MD as PCP - Harlan III, MD as Consulting Physician (General Surgery) Truitt Merle, MD as Consulting Physician (Hematology) Thea Silversmith, MD as Consulting Physician (Radiation Oncology) Rockwell Germany, RN as Registered Nurse Mauro Kaufmann, RN as Registered Nurse Deboraha Sprang, MD as Consulting Physician (Cardiology) Holley Bouche, NP as Nurse Practitioner (Nurse Practitioner) Sylvan Cheese, NP as Nurse Practitioner (Nurse Practitioner)   HPI  [name] is a 65 y.o. 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 Halford Chessman  because her heart rates were fast.  Device interrogation today demonstrated short runs of nonsustained tachycardia. They are not closely clustered.  It turned out also her d-dimer was 2.6 or so. CT angiogram is scheduled this afternoon  With up titration of her carvedilol previously she developed sores on her tongue which have have improved with downtitration   Past Medical History  Diagnosis Date  . Paroxysmal ventricular tachycardia ([place])   . Ostium secundum type atrial septal defect   . Unspecified transient cerebral ischemia   . Takotsubo syndrome   . Unspecified vitamin D deficiency   . Other pulmonary embolism and infarction   . Other chronic nonalcoholic liver disease   . Cerebral aneurysm, nonruptured   . Hypothyroidism   . Hypertension   . Hyperlipidemia   . Esophageal reflux   . Diverticulosis  of colon (without mention of hemorrhage)   . Depressive disorder, not elsewhere classified   . Obstructive sleep apnea (adult) (pediatric)   . GI bleed   . PUD (peptic ulcer disease)   . Clostridium difficile infection   . Anxiety   . Arthritis   . 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 Sarcoid ([place])   . Presence of permanent cardiac pacemaker   . AICD (automatic cardioverter/defibrillator) present   . Stroke [facility]) 2013    tia  . History of hiatal hernia   . GERD (gastroesophageal reflux disease)   . Internal hemorrhoids   . Hiatal hernia   . Radiation 02/03/15-03/10/15    Right Breast    Past Surgical History  Procedure Laterality Date  . Partial hysterectomy      Fibroids  . Cerebral aneurysm repair  02/1999  . Knee arthroscopy      bilateral  . Cardiac defibrillator placement  2006; 2012    BSX single chamber ICD  . Carotid dopplers  2006    neg  . Hospitalization  2004    GI bleed, PUD, diverticulosis (EGD,colonscopy)  . Hospitalization      PE, NSVT, s/p defib  . Abi  2006    normal  . Left heart catheterization with coronary angiogram N/A 02/22/2012    Procedure: LEFT HEART CATHETERIZATION WITH CORONARY ANGIOGRAM;  Surgeon: Burnell Blanks, MD;  Location: [facility] CATH LAB;  Service: Cardiovascular;  Laterality: N/A;  . Abdominal hysterectomy    . Breast lumpectomy with radioactive seed localization Right 01/03/2015  Procedure: BREAST LUMPECTOMY WITH RADIOACTIVE SEED LOCALIZATION;  Surgeon: Autumn Messing III, MD;  Location: [facility];  Service: General;  Laterality: Right;    Current Outpatient Prescriptions  Medication Sig Dispense Refill  . acetaminophen (TYLENOL) 500 MG tablet Take 500 mg by mouth every 6 (six) hours as needed (pain).     [name] aspirin 81 MG tablet Take 81 mg by mouth daily.      . carvedilol (COREG) 12.5 MG tablet Take 1.5 tablet (18.75 mg total) by mouth twice daily with a meal.    . cholecalciferol (VITAMIN D) 1000 UNITS tablet Take 2,000 Units  by mouth daily.    . fexofenadine (ALLEGRA) 180 MG tablet Take 180 mg by mouth daily as needed for allergies or rhinitis.    . fluticasone (FLONASE) 50 MCG/ACT nasal spray Place 2 sprays into both nostrils daily as needed for allergies or rhinitis. 48 g 1  . hydrochlorothiazide (HYDRODIURIL) 25 MG tablet Take 1 tablet (25 mg total) by mouth daily. 90 tablet 1  . HYDROcodone-acetaminophen (NORCO/VICODIN) 5-325 MG tablet Take 1 tablet by mouth as directed.    . latanoprost (XALATAN) 0.005 % ophthalmic solution Place 1 drop into both eyes at bedtime.    [name] levothyroxine (SYNTHROID) 50 MCG tablet Take 1 tablet (50 mcg total) by mouth daily. 90 tablet 3  . Multiple Vitamins-Minerals (MULTIVITAMIN PO) Take 1 tablet by mouth daily.    . pantoprazole (PROTONIX) 40 MG tablet TAKE 1 TABLET (40 MG TOTAL) BY MOUTH DAILY. 90 tablet 4  . polyethylene glycol powder (GLYCOLAX/MIRALAX) powder Take 17 grams by mouth daily 255 g 3  . potassium chloride (K-DUR,KLOR-CON) 10 MEQ tablet Take 5 mEq by mouth daily.    . ranitidine (ZANTAC) 150 MG tablet Take 1 tablet (150 mg total) by mouth daily as needed for heartburn. 30 tablet 9  . simvastatin (ZOCOR) 40 MG tablet Take 0.5 tablets (20 mg total) by mouth daily. 45 tablet 3  . traMADol (ULTRAM) 50 MG tablet Take 50 mg by mouth every 8 (eight) hours as needed. (PAIN)    . UNABLE TO FIND Med Name: Crystallized Sulfur - 1/4 tsp qd in juice    . zolpidem (AMBIEN) 10 MG tablet TAKE ONE TABLET BY MOUTH AT BEDTIME AS NEEDED FOR SLEEP 90 tablet 1  . [DISCONTINUED] potassium chloride (K-DUR) 10 MEQ tablet Take 0.5 tablets (5 mEq total) by mouth daily. 1/2 tab po qd 45 tablet 1   No current facility-administered medications for this visit.    Allergies  Allergen Reactions  . Ace Inhibitors     Angioedema; makes tongue "break out"  . Amitriptyline Hcl     REACTION: ? reaction  . Atorvastatin     REACTION: severe muscle pain  . Cetirizine Hcl     REACTION: sleepy  .  Codeine     REACTION: itching  . Paroxetine     REACTION: ? reaction  . Ramipril Other (See Comments)    Tongue ulcers  . Rosuvastatin     REACTION: severe muscle pain  . Carbamazepine Rash    REACTION: ? reaction  . Dilantin [Phenytoin Sodium Extended] Rash    Review of Systems negative except from HPI and PMH  Physical Exam BP 136/78 mmHg  Pulse 78  Ht 5' 6.5" (1.689 m)  Wt 201 lb 12.8 oz (91.536 kg)  BMI 32.09 kg/m2 Well developed and well nourished in no acute distress HENT normal E scleral and icterus clear Neck Supple JVP flat; carotids brisk  and full Clear to ausculation Device pocket well healed; without hematoma or erythema.  There is no tethering  Regular rate and rhythm, no murmurs gallops or rub Soft with active bowel sounds No clubbing cyanosis tr Edema Alert and oriented, grossly normal motor and sensory function Skin Warm and Dry  ECG SR 65 19  Assessment and  Plan  Nonischemic Cardiomyopathy  ? sarcoid  CHF  Chronic mixed    Ventricular Tachycardia no intercurrent sustainedVT  Palpitations previously identified as PVCs  Sinus tachy  1AVB  Hypertension  glossites  ICD [employer]  The patient's device was interrogated.  The information was reviewed. No changes were made in the programming.     Device interrogation demonstrates nonsustained tachycardia that appears to be ventricular in origin.  Laboratories were unrevealing yesterday as relates to electrolytes. D-dimer was abnormal and CT scan is scheduled for today to exclude pulmonary embolism as a potential contributor  her to her tachycardia  Her palpitations are likely either the sinus tachycardia which were evident when she showed up for the nonsustained episodes that we saw although they latter are really quite a few.  I'm not quite sure whether the glossitis that she describes and the carvedilol or associated. Quick literature search demonstrates an incidence of less than  0.01%. We will however, given the fact that this has happened twice,  presume an  association and switch her to metoprolol which she has previously tolerated well

## 2015-10-27 ENCOUNTER — Encounter: Payer: Self-pay | Admitting: Internal Medicine

## 2015-10-27 LAB — CUP PACEART INCLINIC DEVICE CHECK
Date Time Interrogation Session: 20170309162004
Implantable Lead Location: 753860
Implantable Lead Serial Number: 116340
Lead Channel Setting Pacing Amplitude: 2.4 V
Lead Channel Setting Pacing Pulse Width: 0.4 ms
MDC IDC LEAD IMPLANT DT: 20050603
MDC IDC LEAD MODEL: 185
MDC IDC PG SERIAL: 266301
MDC IDC SET LEADCHNL RV SENSING SENSITIVITY: 0.4 mV

## 2015-10-31 ENCOUNTER — Telehealth: Payer: Self-pay | Admitting: Internal Medicine

## 2015-10-31 NOTE — Telephone Encounter (Signed)
New message   Pt c/o medication issue:  1. Name of Medication: metoprolol 50mg   2. How are you currently taking this medication (dosage and times per day)? 1xday 3. Are you having a reaction (difficulty breathing--STAT)? no 4. What is your medication issue? BP HIGH   133/104   Hr 75

## 2015-10-31 NOTE — Telephone Encounter (Signed)
Spoke with patient who states current BP is 109/79 mmHg but earlier today it was 133/104 mmHg and 131/88 mmHg.  Dr. Caryl Comes switched her from carvedilol to metoprolol on 3/8 due to hx of glossitis with carvedilol.  Upon review of medications, she reports she is taking 1/2 of hydrochlorothiazide 25 mg tablet.  I advised her that our records indicate that she should be taking one 25 mg tablet daily.  She got her Rx bottle upon my request and verified that bottle does indicate 1 tablet daily although she thought it said 1/2 tablet daily.  I advised her that she should take 25 mg daily and continue metoprolol as directed by Dr. Caryl Comes and to continue to monitor vital signs.  She states she can feel her heart beat in her chest at times and is worried about a blood clot.  She also verbalized concern about elevated heart rate in the 70's.  I advised her that normal heart rate can range from 60 to 100 bpm.  She had a normal chest CT on 3/8.  She denies SOB, chest pressure or that her ICD has fired.  I advised her to continue to monitor BP and heart rate and that some more time is needed since recent medication changes.  I advised that I doubt Dr. Caryl Comes would add additional anti-hypertensive medication with the readings reported above.  I advised to call back if diastolic BP is consistently > 90 mmHg or if she has additional concerns.  She verbalized understanding and agreement.

## 2015-11-10 DIAGNOSIS — G4733 Obstructive sleep apnea (adult) (pediatric): Secondary | ICD-10-CM | POA: Diagnosis not present

## 2015-11-13 ENCOUNTER — Telehealth: Payer: Self-pay | Admitting: Family Medicine

## 2015-11-13 DIAGNOSIS — E785 Hyperlipidemia, unspecified: Secondary | ICD-10-CM

## 2015-11-13 DIAGNOSIS — E039 Hypothyroidism, unspecified: Secondary | ICD-10-CM

## 2015-11-13 DIAGNOSIS — R739 Hyperglycemia, unspecified: Secondary | ICD-10-CM

## 2015-11-13 DIAGNOSIS — E559 Vitamin D deficiency, unspecified: Secondary | ICD-10-CM

## 2015-11-13 DIAGNOSIS — I10 Essential (primary) hypertension: Secondary | ICD-10-CM

## 2015-11-13 NOTE — Telephone Encounter (Signed)
-----   Message from Ellamae Sia sent at 11/04/2015 11:26 AM EDT ----- Regarding: lab orders for Tuesday, 3.28.17  AWV lab orders, please.

## 2015-11-14 ENCOUNTER — Telehealth: Payer: Self-pay | Admitting: Pulmonary Disease

## 2015-11-14 DIAGNOSIS — G4733 Obstructive sleep apnea (adult) (pediatric): Secondary | ICD-10-CM

## 2015-11-14 NOTE — Telephone Encounter (Signed)
HST 11/10/15 >> AHI 7.1, SaO2 low 75%   Will have my nurse inform pt that sleep study shows mild sleep apnea.  Options are 1) retry CPAP, 2) arrange for referral to get oral appliance, 3) arrange for ROV to review results in more detail.

## 2015-11-15 ENCOUNTER — Other Ambulatory Visit (INDEPENDENT_AMBULATORY_CARE_PROVIDER_SITE_OTHER): Payer: Medicare Other

## 2015-11-15 ENCOUNTER — Ambulatory Visit (INDEPENDENT_AMBULATORY_CARE_PROVIDER_SITE_OTHER): Payer: Medicare Other | Admitting: Cardiology

## 2015-11-15 ENCOUNTER — Encounter: Payer: Self-pay | Admitting: Cardiology

## 2015-11-15 ENCOUNTER — Other Ambulatory Visit: Payer: Self-pay | Admitting: *Deleted

## 2015-11-15 VITALS — BP 110/60 | HR 60 | Ht 66.5 in | Wt 199.1 lb

## 2015-11-15 DIAGNOSIS — E785 Hyperlipidemia, unspecified: Secondary | ICD-10-CM

## 2015-11-15 DIAGNOSIS — I4729 Other ventricular tachycardia: Secondary | ICD-10-CM

## 2015-11-15 DIAGNOSIS — I472 Ventricular tachycardia, unspecified: Secondary | ICD-10-CM

## 2015-11-15 DIAGNOSIS — I10 Essential (primary) hypertension: Secondary | ICD-10-CM

## 2015-11-15 DIAGNOSIS — R739 Hyperglycemia, unspecified: Secondary | ICD-10-CM | POA: Diagnosis not present

## 2015-11-15 DIAGNOSIS — I429 Cardiomyopathy, unspecified: Secondary | ICD-10-CM | POA: Diagnosis not present

## 2015-11-15 DIAGNOSIS — E559 Vitamin D deficiency, unspecified: Secondary | ICD-10-CM | POA: Diagnosis not present

## 2015-11-15 DIAGNOSIS — I5042 Chronic combined systolic (congestive) and diastolic (congestive) heart failure: Secondary | ICD-10-CM

## 2015-11-15 DIAGNOSIS — E039 Hypothyroidism, unspecified: Secondary | ICD-10-CM | POA: Diagnosis not present

## 2015-11-15 DIAGNOSIS — G4733 Obstructive sleep apnea (adult) (pediatric): Secondary | ICD-10-CM

## 2015-11-15 DIAGNOSIS — Z95810 Presence of automatic (implantable) cardiac defibrillator: Secondary | ICD-10-CM | POA: Diagnosis not present

## 2015-11-15 DIAGNOSIS — R002 Palpitations: Secondary | ICD-10-CM | POA: Diagnosis not present

## 2015-11-15 DIAGNOSIS — I428 Other cardiomyopathies: Secondary | ICD-10-CM

## 2015-11-15 LAB — COMPREHENSIVE METABOLIC PANEL
ALBUMIN: 4.1 g/dL (ref 3.5–5.2)
ALK PHOS: 126 U/L — AB (ref 39–117)
ALT: 27 U/L (ref 0–35)
AST: 30 U/L (ref 0–37)
BUN: 16 mg/dL (ref 6–23)
CALCIUM: 10.4 mg/dL (ref 8.4–10.5)
CHLORIDE: 103 meq/L (ref 96–112)
CO2: 32 mEq/L (ref 19–32)
CREATININE: 0.8 mg/dL (ref 0.40–1.20)
GFR: 91.98 mL/min (ref >60.00)
Glucose, Bld: 98 mg/dL (ref 70–99)
Potassium: 3.9 mEq/L (ref 3.5–5.1)
Sodium: 139 mEq/L (ref 135–145)
TOTAL PROTEIN: 7.4 g/dL (ref 6.0–8.3)
Total Bilirubin: 0.4 mg/dL (ref 0.2–1.2)

## 2015-11-15 LAB — LIPID PANEL
CHOLESTEROL: 147 mg/dL (ref 0–200)
HDL: 48.2 mg/dL (ref >39.00)
LDL CALC: 82 mg/dL (ref 0–99)
NonHDL: 99.27
TRIGLYCERIDES: 85 mg/dL (ref 0.0–149.0)
Total CHOL/HDL Ratio: 3
VLDL: 17 mg/dL (ref 0.0–40.0)

## 2015-11-15 LAB — CBC WITH DIFFERENTIAL/PLATELET
BASOS PCT: 0.8 % (ref 0.0–3.0)
Basophils Absolute: 0 10*3/uL (ref 0.0–0.1)
EOS ABS: 0.2 10*3/uL (ref 0.0–0.7)
EOS PCT: 6.4 % — AB (ref 0.0–5.0)
HEMATOCRIT: 37.4 % (ref 36.0–46.0)
HEMOGLOBIN: 12.3 g/dL (ref 12.0–15.0)
Lymphocytes Relative: 24.7 % (ref 12.0–46.0)
Lymphs Abs: 0.9 10*3/uL (ref 0.7–4.0)
MCHC: 32.8 g/dL (ref 30.0–36.0)
MCV: 87.3 fl (ref 78.0–100.0)
Monocytes Absolute: 0.3 10*3/uL (ref 0.1–1.0)
Monocytes Relative: 8.9 % (ref 3.0–12.0)
Neutro Abs: 2.3 10*3/uL (ref 1.4–7.7)
Neutrophils Relative %: 59.2 % (ref 43.0–77.0)
Platelets: 203 10*3/uL (ref 150.0–400.0)
RBC: 4.28 Mil/uL (ref 3.87–5.11)
RDW: 12.8 % (ref 11.5–15.5)
WBC: 3.8 10*3/uL — AB (ref 4.0–10.5)

## 2015-11-15 LAB — VITAMIN D 25 HYDROXY (VIT D DEFICIENCY, FRACTURES): VITD: 38.74 ng/mL (ref 30.00–100.00)

## 2015-11-15 LAB — TSH: TSH: 2.22 u[IU]/mL (ref 0.35–4.50)

## 2015-11-15 LAB — HEMOGLOBIN A1C: HEMOGLOBIN A1C: 5.5 % (ref 4.6–6.5)

## 2015-11-15 NOTE — Telephone Encounter (Signed)
Spoke with regarding HST results. Pt does not want to try the CPAP again but is interested in trying the Oral Appliance. Order placed for referral to Dr Ron Parker for Oral Appliance eval.  Will send to Dr Halford Chessman as [name].

## 2015-11-15 NOTE — Progress Notes (Signed)
11/15/2015 [name]   11-30-1950  [id number]  Primary Physician Loura Pardon, MD Primary Cardiologist/ Electrophysiologist: Dr. Caryl Comes  Reason for Visit/CC: Palpitations.   HPI:  65 y/o AAF, followed by Dr. Caryl Comes. 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 LHC in 2013 showed no significant CAD. EF in 2012 was 35-40%. She has not sustained any ICD shocks. She also has a h/o frequent PACs as captured on 24 hr Holter monitor ordered in May 2016. She is on BB therapy with metoprolol. Additional past medical history includes hypothyroidism, on Synthroid. This is followed by her PCP.   She was recently seen by Dr. Caryl Comes, 10/27/15. She complained of palpitations. Her device was checked at that visit. This showed NSVT. No programing changes made. She was scheduled for f/u remote device check from home 11/22/15 and instructed to f/u with Dr. Caryl Comes in 1 year.  She presents back to clinic today with a complaint of recurrent palpitations. They feel similar to her palpitations in the past. Episodes come and go and last several seconds. She denies any prolonged episodes. She has felt dizzy but denies any syncope/ near syncope. She also feels short of breath with episodes but no diaphoresis or chest pain. She reports full medication compliance with metoprolol. She denies any triggers including no caffeine, ETOH or OTC meds. No recent fever, chills, n/v/d, cold of flu-like symptoms. No dysuria. She denies any abnormal bleeding. Currently in clinic, she is asymptomatic. EKG shows NSR with HR of 60 bpm. She reports that she told her PCP of her complaints and she ordered basic labs including a CBC, CMP and TSH. Blood work was obtained today and results are pending.    Current Outpatient Prescriptions  Medication Sig Dispense Refill  . acetaminophen (TYLENOL) 500 MG tablet Take 500 mg by mouth every 6 (six) hours as needed (pain).      [name] aspirin 81 MG tablet Take 81 mg by mouth daily.      . cholecalciferol (VITAMIN D) 1000 UNITS tablet Take 2,000 Units by mouth daily.    . fexofenadine (ALLEGRA) 180 MG tablet Take 180 mg by mouth daily as needed for allergies or rhinitis.    . fluticasone (FLONASE) 50 MCG/ACT nasal spray Place 2 sprays into both nostrils daily as needed for allergies or rhinitis. 48 g 1  . hydrochlorothiazide (HYDRODIURIL) 25 MG tablet Take 1 tablet (25 mg total) by mouth daily. 90 tablet 1  . HYDROcodone-acetaminophen (NORCO/VICODIN) 5-325 MG tablet Take 1 tablet by mouth as directed.    . latanoprost (XALATAN) 0.005 % ophthalmic solution Place 1 drop into both eyes at bedtime.    [name] levothyroxine (SYNTHROID) 50 MCG tablet Take 1 tablet (50 mcg total) by mouth daily. 90 tablet 3  . metoprolol succinate (TOPROL-XL) 50 MG 24 hr tablet Take 1 tablet (50 mg total) by mouth daily. Take with or immediately following a meal. 30 tablet 3  . Multiple Vitamins-Minerals (MULTIVITAMIN PO) Take 1 tablet by mouth daily.    . pantoprazole (PROTONIX) 40 MG tablet TAKE 1 TABLET (40 MG TOTAL) BY MOUTH DAILY. 90 tablet 4  . polyethylene glycol powder (GLYCOLAX/MIRALAX) powder Take 17 grams by mouth daily 255 g 3  . potassium chloride (K-DUR,KLOR-CON) 10 MEQ tablet Take 5 mEq by mouth daily.    . ranitidine (ZANTAC) 150 MG tablet Take 1 tablet (150 mg total) by mouth daily as needed for heartburn.  30 tablet 9  . simvastatin (ZOCOR) 40 MG tablet Take 0.5 tablets (20 mg total) by mouth daily. 45 tablet 3  . traMADol (ULTRAM) 50 MG tablet Take 50 mg by mouth every 8 (eight) hours as needed. (PAIN)    . UNABLE TO FIND Med Name: Crystallized Sulfur - 1/4 tsp qd in juice    . zolpidem (AMBIEN) 10 MG tablet TAKE ONE TABLET BY MOUTH AT BEDTIME AS NEEDED FOR SLEEP 90 tablet 1  . [DISCONTINUED] potassium chloride (K-DUR) 10 MEQ tablet Take 0.5 tablets (5 mEq total) by mouth daily. 1/2 tab po qd 45 tablet 1   No current  facility-administered medications for this visit.    Allergies  Allergen Reactions  . Ace Inhibitors     Angioedema; makes tongue "break out"  . Amitriptyline Hcl     REACTION: ? reaction  . Atorvastatin     REACTION: severe muscle pain  . Cetirizine Hcl     REACTION: sleepy  . Codeine     REACTION: itching  . Paroxetine     REACTION: ? reaction  . Ramipril Other (See Comments)    Tongue ulcers  . Rosuvastatin     REACTION: severe muscle pain  . Carbamazepine Rash    REACTION: ? reaction  . Dilantin [Phenytoin Sodium Extended] Rash    Social History   Social History  . Marital Status: Divorced    Spouse Name: N/A  . Number of Children: 2  . Years of Education: N/A   Occupational History  . DISABLED   .     Social History Main Topics  . Smoking status: Former Smoker -- 1.00 packs/day for 15 years    Types: Cigarettes    Quit date: 08/20/1989  . Smokeless tobacco: Never Used     Comment: Started at 63; less than 1 PPD  . Alcohol Use: 0.0 oz/week    0 Standard drinks or equivalent per week     Comment: rare  . Drug Use: No  . Sexual Activity: Not on file   Other Topics Concern  . Not on file   Social History Narrative   Retired from [employer]      2 children      Lives alone      Divorced     Review of Systems: General: negative for chills, fever, night sweats or weight changes.  Cardiovascular: negative for chest pain, dyspnea on exertion, edema, orthopnea, palpitations, paroxysmal nocturnal dyspnea or shortness of breath Dermatological: negative for rash Respiratory: negative for cough or wheezing Urologic: negative for hematuria Abdominal: negative for nausea, vomiting, diarrhea, bright red blood per rectum, melena, or hematemesis Neurologic: negative for visual changes, syncope, or dizziness All other systems reviewed and are otherwise negative except as noted above.    Blood pressure 110/60, pulse 60, height 5' 6.5" (1.689 m), weight 199  lb 1.9 oz (90.32 kg).  General appearance: alert, cooperative and no distress Neck: no carotid bruit and no JVD Lungs: clear to auscultation bilaterally Heart: regular rate and rhythm, S1, S2 normal, no murmur, click, rub or gallop Extremities: no LEE Pulses: 2+ and symmetric Skin: warn and dry Neurologic: Grossly normal  EKG NSR. 60 bpm  ASSESSMENT AND PLAN:   1. Palpitations: symptoms are similar to prior episodes. No prolonged episodes and no syncope/ near syncope. Recent ICD interrogation showed NSVT, for which she takes metoprolol. She has also shown in the past to have frequent PACS. She is well beta blocked with metoprolol  with HR in the 60s currently. Given issues with fatigue, we will not increase her dose. Her PCP ordered a CBC, CMP and TSH. We will await results. This will r/o reversible causes such as anemia, electrolyte abnormalities and thyroid abnormalities. No further w/u indicated at this point. She is scheduled for remote device check in 1 week. This will be reviewed by Dr. Caryl Comes who will determine if any additional medication changes or device reprogramining is needed. We discussed avoidance of triggers including caffeine and ETOH, which she reports she has refrained from.   2. H/O VT in the setting of NICM: patient protected with ICD. No shocks. Followed by Dr. Caryl Comes.    Lyda Jester PA-C 11/15/2015 12:01 PM

## 2015-11-15 NOTE — Patient Instructions (Signed)
Medication Instructions:  Your physician recommends that you continue on your current medications as directed. Please refer to the Current Medication list given to you today.   Labwork: NONE ORDERED  Testing/Procedures: NONE ORDERED  Follow-Up: Your physician recommends that you schedule a follow-up appointment in: [place]   Any Other Special Instructions Will Be Listed Below (If Applicable).   If you need a refill on your cardiac medications before your next appointment, please call your pharmacy.

## 2015-11-16 ENCOUNTER — Other Ambulatory Visit: Payer: Self-pay | Admitting: Family Medicine

## 2015-11-16 ENCOUNTER — Other Ambulatory Visit: Payer: Self-pay | Admitting: Internal Medicine

## 2015-11-16 NOTE — Telephone Encounter (Signed)
Noted  

## 2015-11-21 ENCOUNTER — Ambulatory Visit: Payer: Medicare Other | Admitting: Cardiology

## 2015-11-22 ENCOUNTER — Ambulatory Visit
Admission: RE | Admit: 2015-11-22 | Discharge: 2015-11-22 | Disposition: A | Discharge status: Home / Self Care | Payer: Medicare Other | Source: Ambulatory Visit | Attending: Hematology | Admitting: Hematology

## 2015-11-22 ENCOUNTER — Encounter: Payer: Self-pay | Admitting: Family Medicine

## 2015-11-22 ENCOUNTER — Ambulatory Visit (INDEPENDENT_AMBULATORY_CARE_PROVIDER_SITE_OTHER): Payer: Medicare Other | Admitting: Family Medicine

## 2015-11-22 ENCOUNTER — Ambulatory Visit (INDEPENDENT_AMBULATORY_CARE_PROVIDER_SITE_OTHER): Payer: Medicare Other

## 2015-11-22 ENCOUNTER — Ambulatory Visit (INDEPENDENT_AMBULATORY_CARE_PROVIDER_SITE_OTHER): Payer: Medicare Other | Admitting: *Deleted

## 2015-11-22 VITALS — BP 118/82 | HR 63 | Temp 97.8°F | Ht 66.5 in | Wt 200.0 lb

## 2015-11-22 DIAGNOSIS — I429 Cardiomyopathy, unspecified: Secondary | ICD-10-CM

## 2015-11-22 DIAGNOSIS — I10 Essential (primary) hypertension: Secondary | ICD-10-CM

## 2015-11-22 DIAGNOSIS — Z23 Encounter for immunization: Secondary | ICD-10-CM | POA: Diagnosis not present

## 2015-11-22 DIAGNOSIS — E039 Hypothyroidism, unspecified: Secondary | ICD-10-CM | POA: Diagnosis not present

## 2015-11-22 DIAGNOSIS — Z0000 Encounter for general adult medical examination without abnormal findings: Secondary | ICD-10-CM

## 2015-11-22 DIAGNOSIS — I428 Other cardiomyopathies: Secondary | ICD-10-CM

## 2015-11-22 DIAGNOSIS — Z95810 Presence of automatic (implantable) cardiac defibrillator: Secondary | ICD-10-CM | POA: Diagnosis not present

## 2015-11-22 DIAGNOSIS — E785 Hyperlipidemia, unspecified: Secondary | ICD-10-CM

## 2015-11-22 DIAGNOSIS — Z1159 Encounter for screening for other viral diseases: Secondary | ICD-10-CM

## 2015-11-22 DIAGNOSIS — R739 Hyperglycemia, unspecified: Secondary | ICD-10-CM

## 2015-11-22 DIAGNOSIS — C50411 Malignant neoplasm of upper-outer quadrant of right female breast: Secondary | ICD-10-CM

## 2015-11-22 DIAGNOSIS — E559 Vitamin D deficiency, unspecified: Secondary | ICD-10-CM | POA: Diagnosis not present

## 2015-11-22 MED ORDER — LEVOTHYROXINE SODIUM 50 MCG PO TABS: 50.0000 ug | ORAL_TABLET | Freq: Every day | ORAL | Status: DC

## 2015-11-22 MED ORDER — ZOLPIDEM TARTRATE 10 MG PO TABS: 10.0000 mg | ORAL_TABLET | Freq: Every evening | ORAL | Status: DC | PRN

## 2015-11-22 MED ORDER — POTASSIUM CHLORIDE CRYS ER 10 MEQ PO TBCR: EXTENDED_RELEASE_TABLET | ORAL | Status: DC

## 2015-11-22 MED ORDER — FLUTICASONE PROPIONATE 50 MCG/ACT NA SUSP: 2.0000 | Freq: Every day | NASAL | Status: DC | PRN

## 2015-11-22 NOTE — Progress Notes (Signed)
Pre visit review using our clinic review tool, if applicable. No additional management support is needed unless otherwise documented below in the visit note. 

## 2015-11-22 NOTE — Progress Notes (Signed)
Subjective:    Patient ID: [name], female    DOB: [birth year], 65 y.o.   MRN: [id number]  HPI Here for f/u of chronic health problems  Has been doing pretty well overall   Thinks she has a cold - some chest congestion lately  Took mucinex last night  No fever  A little phlegm    Had AMW visit and that was reviewed  Has had one fall - was running from a wasp , did not hurt herself /just sore for a while   Mm planned for this month - has it scheduled for today  Hx of breast ca- lumpectomy and rad- doing very well / has f/u next month  Also for bronchoscopy next month  Colonoscopy 4/14- polyp - 5 year recall   dexa 6/13 nl  No fractures  Vit D level of 38- she is taking it  Exercise is a challenge- she has been working on her house and planning a knee surgery - unsure yet for a knee replacement   Hep C test drawn today- waiting on that result   bp is stable today  No cp or palpitations or headaches or edema  No side effects to medicines  BP Readings from Last 3 Encounters:  11/22/15 118/82  11/22/15 118/82  11/15/15 110/60     Hypothyroidism  Pt has no clinical changes No change in energy level/ hair or skin/ edema and no tremor Lab Results  Component Value Date   TSH 2.22 11/15/2015     Hyperlipidemia Lab Results  Component Value Date   CHOL 147 11/15/2015   CHOL 156 06/18/2014   CHOL 148 06/18/2013   Lab Results  Component Value Date   HDL 48.20 11/15/2015   HDL 52.80 06/18/2014   HDL 46.60 06/18/2013   Lab Results  Component Value Date   LDLCALC 82 11/15/2015   LDLCALC 90 06/18/2014   LDLCALC 80 06/18/2013   Lab Results  Component Value Date   TRIG 85.0 11/15/2015   TRIG 67.0 06/18/2014   TRIG 106.0 06/18/2013   Lab Results  Component Value Date   CHOLHDL 3 11/15/2015   CHOLHDL 3 06/18/2014   CHOLHDL 3 06/18/2013   Lab Results  Component Value Date   LDLDIRECT 141.8 07/04/2007   stable and good profile on simvastatin and diet    Lost 9 lb  bmi of 31 Is cutting sugar  Review of Systems  Review of Systems  Constitutional: Negative for fever, appetite change, fatigue and unexpected weight change.  Eyes: Negative for pain and visual disturbance.  Respiratory: Negative for wheeze and pos for some exertional shortness of breath.  Pos for mild chest congestion and cough today Cardiovascular: Negative for cp or palpitations    Gastrointestinal: Negative for nausea, diarrhea and constipation.  Genitourinary: Negative for urgency and frequency.  Skin: Negative for pallor or rash   Neurological: Negative for weakness, light-headedness, numbness and headaches.  Hematological: Negative for adenopathy. Does not bruise/bleed easily.  Psychiatric/Behavioral: Negative for dysphoric mood. The patient is not nervous/anxious.      Objective:   Physical Exam  Constitutional: She appears well-developed and well-nourished. No distress.  overwt and well app  HENT:  Head: Normocephalic and atraumatic.  Right Ear: External ear normal.  Left Ear: External ear normal.  Mouth/Throat: Oropharynx is clear and moist.  Eyes: Conjunctivae and EOM are normal. Pupils are equal, round, and reactive to light. No scleral icterus.  Neck: Normal range of motion. Neck supple.  No JVD present. Carotid bruit is not present. No thyromegaly present.  Cardiovascular: Normal rate, regular rhythm and intact distal pulses.  Exam reveals no gallop.   Murmur heard. Pulmonary/Chest: Effort normal and breath sounds normal. No respiratory distress. She has no wheezes. She exhibits no tenderness.  Mild dry crackles bilat at low bases No wheeze   Abdominal: Soft. Bowel sounds are normal. She exhibits no distension, no abdominal bruit and no mass. There is no tenderness.  Genitourinary: No breast swelling, tenderness, discharge or bleeding.  Musculoskeletal: Normal range of motion. She exhibits no edema or tenderness.  Lymphadenopathy:    She has no  cervical adenopathy.  Neurological: She is alert. She has normal reflexes. No cranial nerve deficit. She exhibits normal muscle tone. Coordination normal.  Skin: Skin is warm and dry. No rash noted. No erythema. No pallor.  Psychiatric: She has a normal mood and affect.  Pleasant and talkative          Assessment & Plan:   Problem List Items Addressed This Visit      Cardiovascular and Mediastinum   Essential hypertension - Primary    bp in fair control at this time  BP Readings from Last 1 Encounters:  11/22/15 118/82   No changes needed Disc lifstyle change with low sodium diet and exercise  Labs reviewed         Endocrine   Hypothyroidism    Hypothyroidism  Pt has no clinical changes No change in energy level/ hair or skin/ edema and no tremor Lab Results  Component Value Date   TSH 2.22 11/15/2015          Relevant Medications   levothyroxine (SYNTHROID) 50 MCG tablet     Other   Hyperglycemia    Lab Results  Component Value Date   HGBA1C 5.5 11/15/2015   This is in good control Commended on low glycemic diet and wt loss so far      Hyperlipidemia    Disc goals for lipids and reasons to control them Rev labs with pt Rev low sat fat diet in detail Stable with diet and simvastatin      Vitamin D deficiency    Vitamin D level is therapeutic with current supplementation Disc importance of this to bone and overall health Last dexa was in the normal range

## 2015-11-22 NOTE — Progress Notes (Signed)
Remote ICD transmission.   

## 2015-11-22 NOTE — Patient Instructions (Signed)
Take care of yourself Exercise as tolerated Labs look ok  Keep working on weight loss   For chest congestion - take mucinex - Update if not starting to improve in a week or if worsening  (or if you have a fever)

## 2015-11-22 NOTE — Progress Notes (Signed)
Subjective:   [name] is a 66 y.o. female who presents for Medicare Annual (Subsequent) preventive examination.   Cardiac Risk Factors include: advanced age (>66men, >74 women);hypertension;obesity (BMI >30kg/m2)     Objective:     Vitals: BP 118/82 mmHg  Pulse 63  Temp(Src) 97.8 F (36.6 C) (Oral)  Ht 5' 6.5" (1.689 m)  Wt 200 lb (90.719 kg)  BMI 31.80 kg/m2  SpO2 96%  Body mass index is 31.8 kg/(m^2).   Tobacco History  Smoking status  . Former Smoker -- 1.00 packs/day for 15 years  . Types: Cigarettes  . Quit date: 08/20/1989  Smokeless tobacco  . Never Used    Comment: Started at 72; less than 1 PPD     Counseling given: No   Past Medical History  Diagnosis Date  . Paroxysmal ventricular tachycardia ([place])   . Ostium secundum type atrial septal defect   . Unspecified transient cerebral ischemia   . Takotsubo syndrome   . Unspecified vitamin D deficiency   . Other pulmonary embolism and infarction   . Other chronic nonalcoholic liver disease   . Cerebral aneurysm, nonruptured   . Hypothyroidism   . Hypertension   . Hyperlipidemia   . Esophageal reflux   . Diverticulosis of colon (without mention of hemorrhage)   . Depressive disorder, not elsewhere classified   . Obstructive sleep apnea (adult) (pediatric)   . GI bleed   . PUD (peptic ulcer disease)   . Clostridium difficile infection   . Anxiety   . Arthritis   . 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 Sarcoid ([place])   . Presence of permanent cardiac pacemaker   . AICD (automatic cardioverter/defibrillator) present   . Stroke [facility]) 2013    tia  . History of hiatal hernia   . GERD (gastroesophageal reflux disease)   . Internal hemorrhoids   . Hiatal hernia   . Radiation 02/03/15-03/10/15    Right Breast   Past Surgical History  Procedure Laterality Date  . Partial hysterectomy      Fibroids  . Cerebral aneurysm repair  02/1999  . Knee arthroscopy      bilateral  . Cardiac  defibrillator placement  2006; 2012    BSX single chamber ICD  . Carotid dopplers  2006    neg  . Hospitalization  2004    GI bleed, PUD, diverticulosis (EGD,colonscopy)  . Hospitalization      PE, NSVT, s/p defib  . Abi  2006    normal  . Left heart catheterization with coronary angiogram N/A 02/22/2012    Procedure: LEFT HEART CATHETERIZATION WITH CORONARY ANGIOGRAM;  Surgeon: Burnell Blanks, MD;  Location: [facility] CATH LAB;  Service: Cardiovascular;  Laterality: N/A;  . Abdominal hysterectomy    . Breast lumpectomy with radioactive seed localization Right 01/03/2015    Procedure: BREAST LUMPECTOMY WITH RADIOACTIVE SEED LOCALIZATION;  Surgeon: Autumn Messing III, MD;  Location: [place];  Service: General;  Laterality: Right;   Family History  Problem Relation Age of Onset  . Diabetes Mother   . Alzheimer's disease Mother   . Cardiomyopathy      Family history  . Other Brother     Thyroid problem  . Allergies Sister   . Heart disease Sister   . Heart disease Brother   . Prostate cancer Brother 92    same brother as throat cancer  . Throat cancer Brother     dx in his 63s;  also a smoker  . Heart disease Father   . Cancer Maternal Grandmother 78    colon cancer or abdominal cancer  . Lung cancer Maternal Grandfather 15  . Breast cancer Maternal Aunt 38  . Colon cancer Maternal Aunt 33    same sister as breast at 51  . Lung cancer Maternal Uncle   . Breast cancer Maternal Aunt     dx in her 49s  . Pancreatic cancer Cousin 38    maternal first cousin  . Breast cancer Cousin     paternal first cousin twice removed died in her 3s  . Heart attack Sister   . Stroke Neg Hx   . Hypertension Sister   . Hypertension Brother    History  Sexual Activity  . Sexual Activity: No    Outpatient Encounter Prescriptions as of 11/22/2015  Medication Sig  . acetaminophen (TYLENOL) 500 MG tablet Take 500 mg by mouth every 6 (six) hours as needed (pain).   [name] aspirin 81 MG tablet Take 81 mg  by mouth daily.    . cholecalciferol (VITAMIN D) 1000 UNITS tablet Take 2,000 Units by mouth daily.  . fexofenadine (ALLEGRA) 180 MG tablet Take 180 mg by mouth daily as needed for allergies or rhinitis.  . fluticasone (FLONASE) 50 MCG/ACT nasal spray Place 2 sprays into both nostrils daily as needed for allergies or rhinitis.  . hydrochlorothiazide (HYDRODIURIL) 25 MG tablet TAKE 1 TABLET (25 MG TOTAL) BY MOUTH DAILY.  [name] HYDROcodone-acetaminophen (NORCO/VICODIN) 5-325 MG tablet Take 1 tablet by mouth as directed.  . latanoprost (XALATAN) 0.005 % ophthalmic solution Place 1 drop into both eyes at bedtime.  [name] levothyroxine (SYNTHROID) 50 MCG tablet Take 1 tablet (50 mcg total) by mouth daily.  . metoprolol succinate (TOPROL-XL) 50 MG 24 hr tablet Take 1 tablet (50 mg total) by mouth daily. Take with or immediately following a meal.  . Multiple Vitamins-Minerals (MULTIVITAMIN PO) Take 1 tablet by mouth daily.  . pantoprazole (PROTONIX) 40 MG tablet TAKE 1 TABLET (40 MG TOTAL) BY MOUTH DAILY.  [name] polyethylene glycol powder (GLYCOLAX/MIRALAX) powder Take 17 grams by mouth daily  . potassium chloride (K-DUR,KLOR-CON) 10 MEQ tablet TAKE 1/2 TABLET (5MEQ TOTAL) EVERY DAY  . ranitidine (ZANTAC) 150 MG tablet Take 1 tablet (150 mg total) by mouth daily as needed for heartburn.  . simvastatin (ZOCOR) 40 MG tablet Take 0.5 tablets (20 mg total) by mouth daily.  . traMADol (ULTRAM) 50 MG tablet Take 50 mg by mouth every 8 (eight) hours as needed. (PAIN)  . UNABLE TO FIND Med Name: Crystallized Sulfur - 1/4 tsp qd in juice  . zolpidem (AMBIEN) 10 MG tablet TAKE ONE TABLET BY MOUTH AT BEDTIME AS NEEDED FOR SLEEP   No facility-administered encounter medications on file as of 11/22/2015.    Activities of Daily Living In your present state of health, do you have any difficulty performing the following activities: 11/22/2015 12/29/2014  Hearing? N N  Vision? N Y  Difficulty concentrating or making decisions? N Y    Walking or climbing stairs? Y Y  Dressing or bathing? N N  Doing errands, shopping? N -  Preparing Food and eating ? N -  Using the Toilet? N -  In the past six months, have you accidently leaked urine? Y -  Do you have problems with loss of bowel control? N -  Managing your Medications? N -  Managing your Finances? N -  Housekeeping or managing your Housekeeping? N -  Patient Care Team: Abner Greenspan, MD as PCP - [place] III, MD as Consulting Physician (General Surgery) Truitt Merle, MD as Consulting Physician (Hematology) Thea Silversmith, MD as Consulting Physician (Radiation Oncology) Rockwell Germany, RN as Registered Nurse Mauro Kaufmann, RN as Registered Nurse Deboraha Sprang, MD as Consulting Physician (Cardiology) Holley Bouche, NP as Nurse Practitioner (Nurse Practitioner) Sylvan Cheese, NP as Nurse Practitioner (Nurse Practitioner) Chesley Mires, MD as Consulting Physician (Pulmonary Disease) Autumn Messing III, MD as Consulting Physician (General Surgery) Bo Merino, MD as Consulting Physician (Rheumatology)    Assessment:    Vision Screening Comments: Last eye exam in 03/2015 with Dr. Einar Gip  Exercise Activities and Dietary recommendations Current Exercise Habits: The patient does not participate in regular exercise at present, Exercise limited by: orthopedic condition(s) (pt has exercise restrictions: walking)  Goals    . Weight < 176 lb (79.833 kg)     Starting 11/22/15, I will attempt to reduce all added sugar in diet and increase exercise for up to 90 min 3 days per week.       Fall Risk Fall Risk  11/22/2015 01/19/2015 06/25/2014  Falls in the past year? Yes Yes Yes  Number falls in past yr: 1 1 1   Injury with Fall? Yes No No  Risk for fall due to : Impaired balance/gait;Impaired mobility Other (Comment) -  Risk for fall due to (comments): - running fro wasp, tripped and fell in yard -  Follow up Falls evaluation completed;Education  provided Falls prevention discussed -   Depression Screen PHQ 2/9 Scores 11/22/2015 01/19/2015 06/25/2014  PHQ - 2 Score 0 0 0     Cognitive Testing MMSE - Mini Mental State Exam 11/22/2015  Orientation to time 5  Orientation to Place 5  Registration 3  Attention/ Calculation 0  Recall 3  Language- name 2 objects 0  Language- repeat 1  Language- follow 3 step command 3  Language- read & follow direction 0  Write a sentence 0  Copy design 0  Total score 20   PLEASE NOTE: A Mini-Cog screen was completed. Maximum score is 20. A value of 0 denotes this part of [place] MMSE was not completed.  Orientation to Time - Max 5 Orientation to Place - Max 5 Registration - Max 3 Recall - Max 3 Language Repeat - Max 1 Language Follow 3 Step Command - Max 3   Immunization History  Administered Date(s) Administered  . H1N1 07/24/2008  . Influenza Split 07/17/2011, 09/27/2011, 04/29/2012  . Influenza Whole 07/20/2004, 07/04/2007, 05/07/2008, 05/19/2009, 05/17/2010  . Influenza,inj,Quad PF,36+ Mos 05/20/2013, 05/06/2014, 05/17/2015  . Pneumococcal Conjugate-13 11/22/2015  . Pneumococcal Polysaccharide-23 05/20/2002, 05/07/2008, 06/25/2014  . Td 09/07/2009  . Zoster 09/21/2011   Screening Tests Health Maintenance  Topic Date Due  . MAMMOGRAM - pt has appt 11/22/2015 11/23/2015 (Originally 11/19/2015)  . INFLUENZA VACCINE  03/20/2016  . COLONOSCOPY  11/20/2017  . TETANUS/TDAP  09/08/2019  . DEXA SCAN  Completed  . ZOSTAVAX  Addressed  . Hepatitis C Screening - completed Completed  . PNA vac Low Risk Adult - administered PCV13 Completed      Plan:     I have personally reviewed and addressed the Medicare Annual Wellness questionnaire and have noted the following in the patient's chart:  A. Medical and social history B. Use of alcohol, tobacco or illicit drugs  C. Current medications and supplements D. Functional ability and status E.  Nutritional status F.  Physical  activity G. Advance directives H. List of other physicians I.  Hospitalizations, surgeries, and ER visits in previous 12 months J.  [place] to include hearing, vision, cognitive, depression L. Referrals and appointments - none  In addition, I have reviewed and discussed with patient certain preventive protocols, quality metrics, and best practice recommendations. A written personalized care plan for preventive services as well as general preventive health recommendations were provided to patient.  See attached scanned questionnaire for additional information.   Signed,   Lindell Noe, MHA, BS, LPN Health Advisor [phone]

## 2015-11-22 NOTE — Patient Instructions (Addendum)
[name] , Thank you for taking time to come for your Medicare Wellness Visit. I appreciate your ongoing commitment to your health goals. Please review the following plan we discussed and let me know if I can assist you in the future.   These are the goals we discussed: Goals    . Weight < 176 lb (79.833 kg)     Starting 11/22/15, I will attempt to reduce all added sugar in diet and increase exercise for up to 90 min 3 days per week.        This is a list of the screening recommended for you and due dates:  Health Maintenance  Topic Date Due  . Mammogram  11/23/2015*  . Flu Shot  03/20/2016  . Colon Cancer Screening  11/20/2017  . Tetanus Vaccine  09/08/2019  . DEXA scan (bone density measurement)  Completed  . Shingles Vaccine  Addressed  .  Hepatitis C: One time screening is recommended by Center for Disease Control  (CDC) for  adults born from [birth year] through 1967.   Completed  . Pneumonia vaccines  Completed  *Topic was postponed. The date shown is not the original due date.   Preventive Care for Adults  A healthy lifestyle and preventive care can promote health and wellness. Preventive health guidelines for adults include the following key practices.  . A routine yearly physical is a good way to check with your health care provider about your health and preventive screening. It is a chance to share any concerns and updates on your health and to receive a thorough exam.  . Visit your dentist for a routine exam and preventive care every 6 months. Brush your teeth twice a day and floss once a day. Good oral hygiene prevents tooth decay and gum disease.  . The frequency of eye exams is based on your age, health, family medical history, use  of contact lenses, and other factors. Follow your health care provider's ecommendations for frequency of eye exams.  . Eat a healthy diet. Foods like vegetables, fruits, whole grains, low-fat dairy products, and lean protein foods contain the  nutrients you need without too many calories. Decrease your intake of foods high in solid fats, added sugars, and salt. Eat the right amount of calories for you. Get information about a proper diet from your health care provider, if necessary.  . Regular physical exercise is one of the most important things you can do for your health. Most adults should get at least 150 minutes of moderate-intensity exercise (any activity that increases your heart rate and causes you to sweat) each week. In addition, most adults need muscle-strengthening exercises on 2 or more days a week.  Silver Sneakers may be a benefit available to you. To determine eligibility, you may visit the website: www.silversneakers.com or contact program at 815-571-8204 Mon-Fri between 8AM-8PM.   . Maintain a healthy weight. The body mass index (BMI) is a screening tool to identify possible weight problems. It provides an estimate of body fat based on height and weight. Your health care provider can find your BMI and can help you achieve or maintain a healthy weight.   For adults 20 years and older: ? A BMI below 18.5 is considered underweight. ? A BMI of 18.5 to 24.9 is normal. ? A BMI of 25 to 29.9 is considered overweight. ? A BMI of 30 and above is considered obese.   . Maintain normal blood lipids and cholesterol levels by exercising and minimizing  your intake of saturated fat. Eat a balanced diet with plenty of fruit and vegetables. Blood tests for lipids and cholesterol should begin at age 20 and be repeated every 5 years. If your lipid or cholesterol levels are high, you are over 50, or you are at high risk for heart disease, you may need your cholesterol levels checked more frequently. Ongoing high lipid and cholesterol levels should be treated with medicines if diet and exercise are not working.  . If you smoke, find out from your health care provider how to quit. If you do not use tobacco, please do not start.  . If you  choose to drink alcohol, please do not consume more than 2 drinks per day. One drink is considered to be 12 ounces (355 mL) of beer, 5 ounces (148 mL) of wine, or 1.5 ounces (44 mL) of liquor.  . If you are 62-29 years old, ask your health care provider if you should take aspirin to prevent strokes.  . Use sunscreen. Apply sunscreen liberally and repeatedly throughout the day. You should seek shade when your shadow is shorter than you. Protect yourself by wearing long sleeves, pants, a wide-brimmed hat, and sunglasses year round, whenever you are outdoors.  . Once a month, do a whole body skin exam, using a mirror to look at the skin on your back. Tell your health care provider of new moles, moles that have irregular borders, moles that are larger than a pencil eraser, or moles that have changed in shape or color.     Fall Prevention in the Home  Falls can cause injuries. They can happen to people of all ages. There are many things you can do to make your home safe and to help prevent falls.  WHAT CAN I DO ON THE OUTSIDE OF MY HOME?  Regularly fix the edges of walkways and driveways and fix any cracks.  Remove anything that might make you trip as you walk through a door, such as a raised step or threshold.  Trim any bushes or trees on the path to your home.  Use bright outdoor lighting.  Clear any walking paths of anything that might make someone trip, such as rocks or tools.  Regularly check to see if handrails are loose or broken. Make sure that both sides of any steps have handrails.  Any raised decks and porches should have guardrails on the edges.  Have any leaves, snow, or ice cleared regularly.  Use sand or salt on walking paths during winter.  Clean up any spills in your garage right away. This includes oil or grease spills. WHAT CAN I DO IN THE BATHROOM?   Use night lights.  Install grab bars by the toilet and in the tub and shower. Do not use towel bars as grab  bars.  Use non-skid mats or decals in the tub or shower.  If you need to sit down in the shower, use a plastic, non-slip stool.  Keep the floor dry. Clean up any water that spills on the floor as soon as it happens.  Remove soap buildup in the tub or shower regularly.  Attach bath mats securely with double-sided non-slip rug tape.  Do not have throw rugs and other things on the floor that can make you trip. WHAT CAN I DO IN THE BEDROOM?  Use night lights.  Make sure that you have a light by your bed that is easy to reach.  Do not use any sheets or blankets  that are too big for your bed. They should not hang down onto the floor.  Have a firm chair that has side arms. You can use this for support while you get dressed.  Do not have throw rugs and other things on the floor that can make you trip. WHAT CAN I DO IN THE KITCHEN?  Clean up any spills right away.  Avoid walking on wet floors.  Keep items that you use a lot in easy-to-reach places.  If you need to reach something above you, use a strong step stool that has a grab bar.  Keep electrical cords out of the way.  Do not use floor polish or wax that makes floors slippery. If you must use wax, use non-skid floor wax.  Do not have throw rugs and other things on the floor that can make you trip. WHAT CAN I DO WITH MY STAIRS?  Do not leave any items on the stairs.  Make sure that there are handrails on both sides of the stairs and use them. Fix handrails that are broken or loose. Make sure that handrails are as long as the stairways.  Check any carpeting to make sure that it is firmly attached to the stairs. Fix any carpet that is loose or worn.  Avoid having throw rugs at the top or bottom of the stairs. If you do have throw rugs, attach them to the floor with carpet tape.  Make sure that you have a light switch at the top of the stairs and the bottom of the stairs. If you do not have them, ask someone to add them for  you. WHAT ELSE CAN I DO TO HELP PREVENT FALLS?  Wear shoes that:  Do not have high heels.  Have rubber bottoms.  Are comfortable and fit you well.  Are closed at the toe. Do not wear sandals.  If you use a stepladder:  Make sure that it is fully opened. Do not climb a closed stepladder.  Make sure that both sides of the stepladder are locked into place.  Ask someone to hold it for you, if possible.  Clearly mark and make sure that you can see:  Any grab bars or handrails.  First and last steps.  Where the edge of each step is.  Use tools that help you move around (mobility aids) if they are needed. These include:  Canes.  Walkers.  Scooters.  Crutches.  Turn on the lights when you go into a dark area. Replace any light bulbs as soon as they burn out.  Set up your furniture so you have a clear path. Avoid moving your furniture around.  If any of your floors are uneven, fix them.  If there are any pets around you, be aware of where they are.  Review your medicines with your doctor. Some medicines can make you feel dizzy. This can increase your chance of falling. Ask your doctor what other things that you can do to help prevent falls.   This information is not intended to replace advice given to you by your health care provider. Make sure you discuss any questions you have with your health care provider.   Document Released: 06/02/2009 Document Revised: 12/21/2014 Document Reviewed: 09/10/2014 Elsevier Interactive Patient Education [employer].

## 2015-11-23 LAB — HEPATITIS C ANTIBODY: HCV AB: NEGATIVE

## 2015-11-23 NOTE — Assessment & Plan Note (Signed)
Hypothyroidism  Pt has no clinical changes No change in energy level/ hair or skin/ edema and no tremor Lab Results  Component Value Date   TSH 2.22 11/15/2015

## 2015-11-23 NOTE — Assessment & Plan Note (Signed)
bp in fair control at this time  BP Readings from Last 1 Encounters:  11/22/15 118/82   No changes needed Disc lifstyle change with low sodium diet and exercise  Labs reviewed

## 2015-11-23 NOTE — Assessment & Plan Note (Signed)
Lab Results  Component Value Date   HGBA1C 5.5 11/15/2015   This is in good control Commended on low glycemic diet and wt loss so far

## 2015-11-23 NOTE — Assessment & Plan Note (Signed)
Vitamin D level is therapeutic with current supplementation Disc importance of this to bone and overall health Last dexa was in the normal range

## 2015-11-23 NOTE — Assessment & Plan Note (Signed)
Disc goals for lipids and reasons to control them Rev labs with pt Rev low sat fat diet in detail Stable with diet and simvastatin

## 2015-11-24 NOTE — Progress Notes (Signed)
   Subjective:    Patient ID: [name], female    DOB: [birth year], 65 y.o.   MRN: [id number]  HPI    Review of Systems     Objective:   Physical Exam        Assessment & Plan:  I reviewed health advisor's note, was available for consultation, and agree with documentation and plan.

## 2015-11-30 ENCOUNTER — Encounter: Payer: Self-pay | Admitting: Family Medicine

## 2015-12-06 ENCOUNTER — Telehealth: Payer: Self-pay | Admitting: Pulmonary Disease

## 2015-12-06 ENCOUNTER — Telehealth: Payer: Self-pay | Admitting: Internal Medicine

## 2015-12-06 DIAGNOSIS — G8929 Other chronic pain: Secondary | ICD-10-CM | POA: Insufficient documentation

## 2015-12-06 DIAGNOSIS — M25561 Pain in right knee: Secondary | ICD-10-CM

## 2015-12-06 DIAGNOSIS — M25562 Pain in left knee: Secondary | ICD-10-CM

## 2015-12-06 NOTE — Telephone Encounter (Signed)
Walk in pt form- sports medicine clearance form dropped off-gave to [employer]

## 2015-12-06 NOTE — Telephone Encounter (Signed)
Form given to VS to sign.  Please advise Dr Halford Chessman. Thanks.

## 2015-12-06 NOTE — Telephone Encounter (Signed)
Pt is going to have knee replacement [phone] [name]

## 2015-12-06 NOTE — Telephone Encounter (Signed)
lmomtcb for pt - what type of ortho surgery is pt having? Form given to [name].

## 2015-12-06 NOTE — Telephone Encounter (Signed)
Patient states she is having Knee replacement surgery and needs forms to be completed as soon as possible. Forwarding to [name] for follow up on Forms.

## 2015-12-08 ENCOUNTER — Telehealth: Payer: Self-pay | Admitting: Family Medicine

## 2015-12-08 NOTE — Telephone Encounter (Signed)
Form completed, and in my office.

## 2015-12-08 NOTE — Telephone Encounter (Signed)
Form placed in your inbox for review when you return on Monday

## 2015-12-08 NOTE — Telephone Encounter (Signed)
Will review it when I return

## 2015-12-08 NOTE — Telephone Encounter (Signed)
Pt dropped off pre-op clearance form for Dr. Vickey Huger for a knee replacement. Pt states she is also getting her cardiologist, Dr. Caryl Comes, to complete the form as well. Form in RX tower at front desk.

## 2015-12-09 NOTE — Telephone Encounter (Signed)
Checked Dr. Juanetta Gosling office, did not see form. [name], do you have form? Please advise.

## 2015-12-12 NOTE — Telephone Encounter (Signed)
This form was faxed but did not go though, fax failed. This needs to be refaxed.

## 2015-12-12 NOTE — Telephone Encounter (Signed)
Form has been re-faxed to [place], [place] [phone] Copy left up front in case fax fails.  Will hol din my box to follow up

## 2015-12-12 NOTE — Telephone Encounter (Signed)
I do not see this form up front, or in VS' cubby. [name] please advise if you have this form to be refaxed.  Thanks!

## 2015-12-16 ENCOUNTER — Telehealth: Payer: Self-pay | Admitting: Internal Medicine

## 2015-12-16 NOTE — Telephone Encounter (Signed)
Surgical clearance form faxed to [place] at [phone]. Confirmation received. I have spoken with the patient and she is also aware.

## 2015-12-19 NOTE — Telephone Encounter (Signed)
[name] please advise if this encounter can be closed.  Thanks.  

## 2015-12-21 ENCOUNTER — Ambulatory Visit (AMBULATORY_SURGERY_CENTER): Payer: Self-pay

## 2015-12-21 VITALS — Ht 66.0 in | Wt 201.8 lb

## 2015-12-21 DIAGNOSIS — K219 Gastro-esophageal reflux disease without esophagitis: Secondary | ICD-10-CM

## 2015-12-21 NOTE — Progress Notes (Signed)
No allergies to eggs or soy No past problems with anesthesia No diet meds No home oxygen  Has email and internet; refused emmi

## 2015-12-22 NOTE — Telephone Encounter (Signed)
[name] please advise

## 2015-12-23 ENCOUNTER — Other Ambulatory Visit: Payer: Self-pay | Admitting: Orthopedic Surgery

## 2015-12-27 NOTE — Telephone Encounter (Signed)
Called Dr Ruel Favors Office, confirmed that surgical clearance from Pulmonary was received. Nothing further needed.

## 2015-12-27 NOTE — Telephone Encounter (Signed)
Please advise if this encounter can be closed.

## 2016-01-03 ENCOUNTER — Other Ambulatory Visit: Payer: Self-pay | Admitting: Family Medicine

## 2016-01-04 ENCOUNTER — Encounter: Payer: Self-pay | Admitting: Internal Medicine

## 2016-01-04 ENCOUNTER — Ambulatory Visit (AMBULATORY_SURGERY_CENTER): Payer: Medicare Other | Admitting: Internal Medicine

## 2016-01-04 VITALS — BP 133/58 | HR 57 | Temp 98.6°F | Resp 13 | Ht 66.0 in | Wt 201.0 lb

## 2016-01-04 DIAGNOSIS — K222 Esophageal obstruction: Secondary | ICD-10-CM | POA: Diagnosis not present

## 2016-01-04 DIAGNOSIS — K2970 Gastritis, unspecified, without bleeding: Secondary | ICD-10-CM

## 2016-01-04 DIAGNOSIS — K31A0 Gastric intestinal metaplasia, unspecified: Secondary | ICD-10-CM

## 2016-01-04 DIAGNOSIS — K3189 Other diseases of stomach and duodenum: Secondary | ICD-10-CM

## 2016-01-04 DIAGNOSIS — K2940 Chronic atrophic gastritis without bleeding: Secondary | ICD-10-CM

## 2016-01-04 DIAGNOSIS — K2950 Unspecified chronic gastritis without bleeding: Secondary | ICD-10-CM | POA: Diagnosis not present

## 2016-01-04 MED ORDER — PANTOPRAZOLE SODIUM 40 MG PO TBEC: 40.0000 mg | DELAYED_RELEASE_TABLET | Freq: Two times a day (BID) | ORAL | Status: DC

## 2016-01-04 MED ORDER — SODIUM CHLORIDE 0.9 % IV SOLN: 500.0000 mL | INTRAVENOUS | Status: DC

## 2016-01-04 NOTE — Op Note (Signed)
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 The patient's tolerance of                            previous anesthesia was also reviewed. The risks                            and benefits of the procedure and the sedation                            options and risks were discussed with the patient.                            All questions were answered, and informed consent                            was obtained. Prior Anticoagulants: The patient has                            taken no previous anticoagulant or antiplatelet                            agents. ASA Grade Assessment: II - A patient with                            mild systemic disease. After reviewing the risks                            and benefits, the patient was deemed in                            satisfactory condition to undergo the procedure.  After obtaining informed consent, the endoscope was                            passed under direct vision. Throughout the                            procedure, the patient's blood pressure, pulse, and                            oxygen saturations were monitored continuously. The                            Model GIF-HQ190 [id number]) scope was introduced                            through the mouth, and advanced to the  second part                            of duodenum. The upper GI endoscopy was                            accomplished without difficulty. The patient                            tolerated the procedure well. Scope In: Scope Out: Findings:                 The lower third of the esophagus was mildly                            tortuous.                           A non-obstructing Schatzki ring (acquired) was                            found at the gastroesophageal junction.                           A 3 cm hiatal hernia was present.                           Localized minimal inflammation was found in the                            gastric antrum. Multiple sampling biopsies were                            obtained with cold forceps for histology in the                            gastric antrum.                           The cardia, gastric fundus and gastric body were  normal. Multiple sampling biopsies were obtained                            with cold forceps for histology in the gastric body.                           The examined duodenum was normal. Complications:            No immediate complications. Estimated Blood Loss:     Estimated blood loss: none. Impression:               - Non-obstructing Schatzki ring.                           - 3 cm hiatal hernia.                           - Gastritis.                           - Normal cardia, gastric fundus and gastric body.                           - Normal examined duodenum.                           - Multiple biopsies were obtained in the gastric                            antrum.                           - Multiple biopsies were obtained in the gastric                            body. Recommendation:           - Patient has a contact number available for                            emergencies. The signs and symptoms of potential                            delayed complications were discussed with the                             patient. Return to normal activities tomorrow.                            Written discharge instructions were provided to the                            patient.                           - Resume previous diet.                           -  Continue present medications.                           - Await pathology results. Jerene Bears, MD 01/04/2016 11:20:36 AM This report has been signed electronically.

## 2016-01-04 NOTE — Progress Notes (Signed)
Called to room to assist during endoscopic procedure.  Patient ID and intended procedure confirmed with present staff. Received instructions for my participation in the procedure from the performing physician.  

## 2016-01-04 NOTE — Progress Notes (Signed)
Report to PACU, RN, vss, BBS= Clear.  

## 2016-01-04 NOTE — Patient Instructions (Addendum)
Biopsies taken today. Result letter in your mail in 2-3 weeks.  Handout given on Gastritis. Increase Pantoprazole to twice a day, new prescription sent to mail order.  Stop taking Zantac.  Call us with any questions or concerns. Thank you!  YOU HAD AN ENDOSCOPIC PROCEDURE TODAY AT [facility]:   Refer to the procedure report that was given to you for any specific questions about what was found during the examination.  If the procedure report does not answer your questions, please call your gastroenterologist to clarify.  If you requested that your care partner not be given the details of your procedure findings, then the procedure report has been included in a sealed envelope for you to review at your convenience later.  YOU SHOULD EXPECT: Some feelings of bloating in the abdomen. Passage of more gas than usual.  Walking can help get rid of the air that was put into your GI tract during the procedure and reduce the bloating. If you had a lower endoscopy (such as a colonoscopy or flexible sigmoidoscopy) you may notice spotting of blood in your stool or on the toilet paper. If you underwent a bowel prep for your procedure, you may not have a normal bowel movement for a few days.  Please Note:  You might notice some irritation and congestion in your nose or some drainage.  This is from the oxygen used during your procedure.  There is no need for concern and it should clear up in a day or so.  SYMPTOMS TO REPORT IMMEDIATELY:    Following upper endoscopy (EGD)  Vomiting of blood or coffee ground material  New chest pain or pain under the shoulder blades  Painful or persistently difficult swallowing  New shortness of breath  Fever of 100F or higher  Black, tarry-looking stools  For urgent or emergent issues, a gastroenterologist can be reached at any hour by calling [phone].   DIET: Your first meal following the procedure should be a small meal and then it is ok to  progress to your normal diet. Heavy or fried foods are harder to digest and may make you feel nauseous or bloated.  Likewise, meals heavy in dairy and vegetables can increase bloating.  Drink plenty of fluids but you should avoid alcoholic beverages for 24 hours.  ACTIVITY:  You should plan to take it easy for the rest of today and you should NOT DRIVE or use heavy machinery until tomorrow (because of the sedation medicines used during the test).    FOLLOW UP: Our staff will call the number listed on your records the next business day following your procedure to check on you and address any questions or concerns that you may have regarding the information given to you following your procedure. If we do not reach you, we will leave a message.  However, if you are feeling well and you are not experiencing any problems, there is no need to return our call.  We will assume that you have returned to your regular daily activities without incident.  If any biopsies were taken you will be contacted by phone or by letter within the next 1-3 weeks.  Please call us at [phone] if you have not heard about the biopsies in 3 weeks.    SIGNATURES/CONFIDENTIALITY: You and/or your care partner have signed paperwork which will be entered into your electronic medical record.  These signatures attest to the fact that that the information above on your After Visit  Summary has been reviewed and is understood.  Full responsibility of the confidentiality of this discharge information lies with you and/or your care-partner. 

## 2016-01-05 ENCOUNTER — Telehealth: Payer: Self-pay

## 2016-01-05 NOTE — Telephone Encounter (Signed)
  Follow up Call-  Call back number 01/04/2016  Post procedure Call Back phone  # [phone]  Permission to leave phone message Yes     Patient questions:  Do you have a fever, pain , or abdominal swelling? No. Pain Score  0 *  Have you tolerated food without any problems? Yes.    Have you been able to return to your normal activities? Yes.    Do you have any questions about your discharge instructions: Diet   No. Medications  No. Follow up visit  No.  Do you have questions or concerns about your Care? No.  Actions: * If pain score is 4 or above: No action needed, pain <4.

## 2016-01-09 ENCOUNTER — Encounter: Payer: Self-pay | Admitting: Internal Medicine

## 2016-01-12 ENCOUNTER — Other Ambulatory Visit: Payer: Self-pay | Admitting: *Deleted

## 2016-01-12 ENCOUNTER — Telehealth: Payer: Self-pay | Admitting: Internal Medicine

## 2016-01-12 ENCOUNTER — Encounter: Payer: Self-pay | Admitting: Cardiology

## 2016-01-12 LAB — CUP PACEART REMOTE DEVICE CHECK
Battery Remaining Longevity: 90 mo
Brady Statistic RV Percent Paced: 0 %
Date Time Interrogation Session: 20170404070100
HIGH POWER IMPEDANCE MEASURED VALUE: 65 Ohm
Implantable Lead Implant Date: 20050603
Implantable Lead Location: 753860
Implantable Lead Model: 185
Implantable Lead Serial Number: 116340
Lead Channel Pacing Threshold Amplitude: 0.8 V
Lead Channel Pacing Threshold Pulse Width: 0.4 ms
MDC IDC MSMT BATTERY REMAINING PERCENTAGE: 96 %
MDC IDC MSMT LEADCHNL RV IMPEDANCE VALUE: 641 Ohm
MDC IDC PG SERIAL: 266301
MDC IDC SET LEADCHNL RV PACING AMPLITUDE: 2.4 V
MDC IDC SET LEADCHNL RV PACING PULSEWIDTH: 0.4 ms
MDC IDC SET LEADCHNL RV SENSING SENSITIVITY: 0.4 mV

## 2016-01-12 MED ORDER — METOPROLOL SUCCINATE ER 50 MG PO TB24: 50.0000 mg | ORAL_TABLET | Freq: Every day | ORAL | Status: DC

## 2016-01-13 NOTE — Telephone Encounter (Signed)
Patient states that she just received the pantoprazole in the mail yesterday evening. Nothing further needed.

## 2016-01-19 ENCOUNTER — Ambulatory Visit (HOSPITAL_BASED_OUTPATIENT_CLINIC_OR_DEPARTMENT_OTHER): Payer: Medicare Other | Admitting: Hematology

## 2016-01-19 ENCOUNTER — Encounter: Payer: Self-pay | Admitting: Hematology

## 2016-01-19 VITALS — BP 135/63 | HR 63 | Temp 98.2°F | Resp 18 | Ht 66.0 in | Wt 201.6 lb

## 2016-01-19 DIAGNOSIS — C50411 Malignant neoplasm of upper-outer quadrant of right female breast: Secondary | ICD-10-CM

## 2016-01-19 DIAGNOSIS — I2510 Atherosclerotic heart disease of native coronary artery without angina pectoris: Secondary | ICD-10-CM

## 2016-01-19 DIAGNOSIS — Z86000 Personal history of in-situ neoplasm of breast: Secondary | ICD-10-CM

## 2016-01-19 NOTE — Progress Notes (Signed)
[place] NOTE  Patient Care Team: Abner Greenspan, MD as PCP - [place] III, MD as Consulting Physician (General Surgery) Truitt Merle, MD as Consulting Physician (Hematology) Thea Silversmith, MD as Consulting Physician (Radiation Oncology) Rockwell Germany, RN as Registered Nurse Mauro Kaufmann, RN as Registered Nurse Deboraha Sprang, MD as Consulting Physician (Cardiology) Holley Bouche, NP as Nurse Practitioner (Nurse Practitioner) Sylvan Cheese, NP as Nurse Practitioner (Nurse Practitioner) Chesley Mires, MD as Consulting Physician (Pulmonary Disease) Autumn Messing III, MD as Consulting Physician (General Surgery) Bo Merino, MD as Consulting Physician (Rheumatology)  CHIEF COMPLAINTS:  Follow Up right breast DCIS  HISTORY OF PRESENTING ILLNESS:  [name] 65 y.o. female is here because of recent diagnosis of right breast DCIS  The cancer was detected by screening mammogram on 10/28/2014. She underwent a diagnostic mammogram on the next day, which showed a group of microcalcifications in the upper outer quadrant of right breast, spanning 11 mm. The cancer was not palpable prior to diagnosis. She underwent ultrasound-guided needle biopsy on 11/19/2014 which showed high-grade DCIS with calcification and necrosis. ER/PR was 4-6% positive.  I reviewed her records extensively and collaborated the history with the patient.  She has been feeling fatigued for the past few month, but able to function well at home. Her granddaughter lives with her. She has both knee pain from arthritis, no knee surgery yet. She also has back pain, moderate, she take tramadol, hydrocordone for pains.   SUMMARY OF ONCOLOGIC HISTORY: Oncology History   Breast cancer of upper-outer quadrant of right female breast   Staging form: Breast, AJCC 7th Edition     Clinical stage from 12/01/2014: Stage 0 (Tis (DCIS), N0, M0) - Unsigned        Breast cancer of upper-outer  quadrant of right female breast ([place])   10/29/2014 Mammogram Right breast: microcalcifications noted; need further imaging   11/05/2014 Breast US Right breast: microcalcifications in the upper outer quadrant, spanning 11 mm   11/19/2014 Initial Biopsy Right breast core needle bx: DCIS, high grade, with calcificiations and necrosis, ER+ (6%), PR+ (4%).     11/19/2014 Clinical Stage Stage 0: Tis N0   12/08/2014 Procedure Genetics: CancerNext no clinically sig. variant detected in APC, ATM, BARD1, BMPR1A, BRCA1/2, BRIP1, CDH1, CDK4, CDKN2A, CHEK2, EPCAM, GREM1, MLH1, MRE11A, MSH2/6, MUTYH, NBN, NF1, PALB2, PMS2, POLD1, POLE, PTEN, RAD50, RAD51D, SMAD4, SMARCA4, STK1, TP53   01/03/2015 Definitive Surgery Right lumpectomy [name]): DCIS with calcifications, high grade, 1.8 cm.  Clear margins.    01/03/2015 Pathologic Stage Stage 0: pTis pNx   01/20/2015 -  Anti-estrogen oral therapy Not recommended due to weakly positive hormonal receptors and cardiac history.   02/03/2015 - 03/10/2015 Radiation Therapy Adjuvant RT completed Pablo Ledger): Right breast 50 Gy over 25 fractions.   05/10/2015 Survivorship Survivorship visit completed and copy of care plan provided to patient.    In terms of breast cancer risk profile:  She menarched at early age of 66 and went to menopause at age 10 She had 3 pregnancy, her first child was born at age 42 She did no breast-fed her child  She received birth control pills for approximately 3-4 years  She was never exposed to fertility medications or hormone replacement therapy.  She has family history of Breast and colon cancer  CURRENT THERAPY: Surveillance  INTERIM HISTORY: [name] returns for follow-up. I saw her one year ago. She has been doing well overall. She  underwent left total knee replacement in December 2016, and has recovered very well. She denies any other new pain, or other symptoms.  MEDICAL HISTORY:  Past Medical History  Diagnosis Date  . Paroxysmal ventricular  tachycardia ([place])   . Ostium secundum type atrial septal defect   . Unspecified transient cerebral ischemia   . Takotsubo syndrome   . Unspecified vitamin D deficiency   . Other pulmonary embolism and infarction   . Other chronic nonalcoholic liver disease   . Cerebral aneurysm, nonruptured   . Hypothyroidism   . Hypertension   . Hyperlipidemia   . Esophageal reflux   . Diverticulosis of colon (without mention of hemorrhage)   . Depressive disorder, not elsewhere classified   . Obstructive sleep apnea (adult) (pediatric)   . GI bleed   . PUD (peptic ulcer disease)   . Clostridium difficile infection   . Anxiety   . Arthritis   . Breast cancer ([place]) 2016    DCIS ER-/PR-  . Sarcoid ([place])   . Presence of permanent cardiac pacemaker   . AICD (automatic cardioverter/defibrillator) present   . Stroke [facility]) 2013    tia  . History of hiatal hernia   . GERD (gastroesophageal reflux disease)   . Internal hemorrhoids   . Hiatal hernia   . Radiation 02/03/15-03/10/15    Right Breast    SURGICAL HISTORY: Past Surgical History  Procedure Laterality Date  . Partial hysterectomy      Fibroids  . Cerebral aneurysm repair  02/1999  . Knee arthroscopy      bilateral  . Cardiac defibrillator placement  2006; 2012    BSX single chamber ICD  . Carotid dopplers  2006    neg  . Hospitalization  2004    GI bleed, PUD, diverticulosis (EGD,colonscopy)  . Hospitalization      PE, NSVT, s/p defib  . Abi  2006    normal  . Left heart catheterization with coronary angiogram N/A 02/22/2012    Procedure: LEFT HEART CATHETERIZATION WITH CORONARY ANGIOGRAM;  Surgeon: Burnell Blanks, MD;  Location: [facility] CATH LAB;  Service: Cardiovascular;  Laterality: N/A;  . Abdominal hysterectomy    . Breast lumpectomy with radioactive seed localization Right 01/03/2015    Procedure: BREAST LUMPECTOMY WITH RADIOACTIVE SEED LOCALIZATION;  Surgeon: Autumn Messing III, MD;  Location: [place];  Service: General;   Laterality: Right;    SOCIAL HISTORY: Social History   Social History  . Marital Status: Divorced    Spouse Name: N/A  . Number of Children: 2  . Years of Education: N/A   Occupational History  . DISABLED   .     Social History Main Topics  . Smoking status: Former Smoker -- 1.00 packs/day for 15 years    Types: Cigarettes    Quit date: 08/20/1989  . Smokeless tobacco: Never Used     Comment: Started at 89; less than 1 PPD  . Alcohol Use: 0.0 oz/week    0 Standard drinks or equivalent per week     Comment: rare  . Drug Use: No  . Sexual Activity: No   Other Topics Concern  . Not on file   Social History Narrative   Retired from [employer]      2 children      Lives alone      Divorced    FAMILY HISTORY: Family History  Problem Relation Age of Onset  . Diabetes Mother   . Alzheimer's disease Mother   .  Cardiomyopathy      Family history  . Other Brother     Thyroid problem  . Allergies Sister   . Heart disease Sister   . Heart disease Brother   . Prostate cancer Brother 38    same brother as throat cancer  . Throat cancer Brother     dx in his 38s; also a smoker  . Heart disease Father   . Cancer Maternal Grandmother 41    colon cancer or abdominal cancer  . Lung cancer Maternal Grandfather 2  . Breast cancer Maternal Aunt 66  . Colon cancer Maternal Aunt 82    same sister as breast at 72  . Lung cancer Maternal Uncle   . Breast cancer Maternal Aunt     dx in her 9s  . Pancreatic cancer Cousin 48    maternal first cousin  . Breast cancer Cousin     paternal first cousin twice removed died in her 37s  . Heart attack Sister   . Stroke Neg Hx   . Hypertension Sister   . Hypertension Brother     ALLERGIES:  is allergic to ace inhibitors; amitriptyline hcl; atorvastatin; cetirizine hcl; codeine; paroxetine; ramipril; rosuvastatin; carbamazepine; and dilantin.  MEDICATIONS:  Current Outpatient Prescriptions  Medication Sig Dispense Refill   . acetaminophen (TYLENOL) 500 MG tablet Take 500 mg by mouth every 6 (six) hours as needed (pain).     [name] aspirin 81 MG tablet Take 81 mg by mouth daily.      . cholecalciferol (VITAMIN D) 1000 UNITS tablet Take 2,000 Units by mouth daily.    . fexofenadine (ALLEGRA) 180 MG tablet Take 180 mg by mouth daily as needed for allergies or rhinitis.    . fluticasone (FLONASE) 50 MCG/ACT nasal spray Place 2 sprays into both nostrils daily as needed for allergies or rhinitis. 48 g 3  . hydrochlorothiazide (HYDRODIURIL) 25 MG tablet TAKE 1 TABLET (25 MG TOTAL) BY MOUTH DAILY. 90 tablet 3  . HYDROcodone-acetaminophen (NORCO/VICODIN) 5-325 MG tablet Take 1 tablet by mouth as directed.    . latanoprost (XALATAN) 0.005 % ophthalmic solution Place 1 drop into both eyes at bedtime.    [name] levothyroxine (SYNTHROID) 50 MCG tablet Take 1 tablet (50 mcg total) by mouth daily. 90 tablet 3  . metoprolol succinate (TOPROL-XL) 50 MG 24 hr tablet Take 1 tablet (50 mg total) by mouth daily. Take with or immediately following a meal. 90 tablet 3  . Multiple Vitamins-Minerals (MULTIVITAMIN PO) Take 1 tablet by mouth daily.    . pantoprazole (PROTONIX) 40 MG tablet Take 1 tablet (40 mg total) by mouth 2 (two) times daily before a meal. 180 tablet 3  . polyethylene glycol powder (GLYCOLAX/MIRALAX) powder Take 17 grams by mouth daily 255 g 3  . potassium chloride (K-DUR,KLOR-CON) 10 MEQ tablet TAKE 1/2 TABLET (5MEQ TOTAL) EVERY DAY 45 tablet 3  . simvastatin (ZOCOR) 40 MG tablet Take 0.5 tablets (20 mg total) by mouth daily. 45 tablet 3  . traMADol (ULTRAM) 50 MG tablet Take 50 mg by mouth every 8 (eight) hours as needed. (PAIN)    . zolpidem (AMBIEN) 10 MG tablet Take 1 tablet (10 mg total) by mouth at bedtime as needed. for sleep 90 tablet 1  . [DISCONTINUED] potassium chloride (K-DUR) 10 MEQ tablet Take 0.5 tablets (5 mEq total) by mouth daily. 1/2 tab po qd 45 tablet 1   No current facility-administered medications for this  visit.    REVIEW OF SYSTEMS:  Constitutional: Denies fevers, chills or abnormal night sweats Eyes: Denies blurriness of vision, double vision or watery eyes Ears, nose, mouth, throat, and face: Denies mucositis or sore throat Respiratory: Denies cough, dyspnea or wheezes Cardiovascular: Denies palpitation, chest discomfort or lower extremity swelling Gastrointestinal:  Denies nausea, heartburn or change in bowel habits Skin: Denies abnormal skin rashes Lymphatics: Denies new lymphadenopathy or easy bruising Neurological:Denies numbness, tingling or new weaknesses Behavioral/Psych: Mood is stable, no new changes  All other systems were reviewed with the patient and are negative.  PHYSICAL EXAMINATION: ECOG PERFORMANCE STATUS: 1 - Symptomatic but completely ambulatory  Filed Vitals:   01/19/16 1309  BP: 135/63  Pulse: 63  Temp: 98.2 F (36.8 C)  Resp: 18   Filed Weights   01/19/16 1309  Weight: 201 lb 9.6 oz (91.445 kg)    GENERAL:alert, no distress and comfortable SKIN: skin color, texture, turgor are normal, no rashes or significant lesions EYES: normal, conjunctiva are pink and non-injected, sclera clear OROPHARYNX:no exudate, no erythema and lips, buccal mucosa, and tongue normal  NECK: supple, thyroid normal size, non-tender, without nodularity LYMPH:  no palpable lymphadenopathy in the cervical, axillary or inguinal LUNGS: clear to auscultation and percussion with normal breathing effort HEART: regular rate & rhythm and no murmurs and no lower extremity edema ABDOMEN:abdomen soft, non-tender and normal bowel sounds Musculoskeletal:no cyanosis of digits and no clubbing  PSYCH: alert & oriented x 3 with fluent speech NEURO: no focal motor/sensory deficits Breasts: Breast inspection showed them to be symmetrical with no nipple discharge or skin changes. A well-healed surgical scar in the right breast. No palpable mass in the right breast. left breast and axilla exam  revealed no obvious mass or node that I could appreciate.   LABORATORY DATA:  I have reviewed the data as listed Lab Results  Component Value Date   WBC 3.8* 11/15/2015   HGB 12.3 11/15/2015   HCT 37.4 11/15/2015   MCV 87.3 11/15/2015   PLT 203.0 11/15/2015   Lab Results  Component Value Date   NA 139 11/15/2015   K 3.9 11/15/2015   CL 103 11/15/2015   CO2 32 11/15/2015   Pathology report Diagnosis 01/03/2015 Breast, lumpectomy, right - DUCTAL CARCINOMA IN SITU WITH CALCIFICATIONS, HIGH GRADE, SPANNING 1.8 CM. - DUCTAL CARCINOMA IN SITU IS FOCALLY 0.1 CM TO THE MEDIAL MARGIN. - SEE ONCOLOGY TABLE BELOW. Microscopic Comment BREAST, IN SITU CARCINOMA Specimen, including laterality: Right breast. Procedure (include lymph node sampling sentinel-non-sentinel): Lumpectomy. Grade of carcinoma: High grade. Necrosis: Present. Estimated tumor size: (gross measurement): 1.8 cm Treatment effect: N/A Distance to closest margin: Focally 0.1 cm to the medial margin. Breast prognostic profile: [phone] Estrogen receptor: 6%, weak staining intensity. Progesterone receptor: 4%, weak staining intensity. Lymph nodes: Not examined. TNM: pTis, pNX Comments: In addition to the high grade ductal carcinoma in situ, there is fibrocystic changes with adenosis, usual ductal hyperplasia and calcifications, a fibroadenoma, vascular calcifications, and a healing biopsy site. (JBK:gt, 01/04/15)   RADIOGRAPHIC STUDIES: I have personally reviewed the radiological images as listed and agreed with the findings in the report.  Diagnostic mammogram 11/22/2015 IMPRESSION: No evidence of recurrent or new breast malignancy. Benign postsurgical changes on the right.  RECOMMENDATION: Diagnostic mammography in 1 year per standard post lumpectomy protocol. ASSESSMENT:  65 year old postmenopausal woman with extensive past medical history of heart failure, status post ICD placement, sleep apnea and  hypothyroidism, who was found to have right breast DCIS by screening mammogram.  PLAN:  #1  right breast high grade DCIS, ER/PR weakly positive (4-6%) -I previously discussed her surgical pathology results with patient in details. Her DCIS has been completely resected. -The patient has early stage disease. She is considered cured of disease by computed surgical resection alone  -Any form of adjuvant treatment is for prevention of disease recurrence.  -Giving the very weak ER/PR expression of her tumor, and her extensive cardiac disease, I do not recommend adjuvant endocrine therapy.  -She received adjuvant breast radiation -She is clinically doing well, last mammogram was normal. Her exam today was unremarkable. -I encouraged her to continue breast cancer surveillance with yearly mammogram and physical exam. She will follow-up with [place] in one year.  FOLLOW UP: One year with exam only. I ordered her next mammogram in March 2018.  All questions were answered. The patient knows to call the clinic with any problems, questions or concerns. I spent 15 minutes counseling the patient face to face. The total time spent in the appointment was 20 minutes and more than 50% was on counseling.     Truitt Merle, MD 01/19/2016

## 2016-01-20 ENCOUNTER — Telehealth: Payer: Self-pay | Admitting: Hematology

## 2016-01-20 NOTE — Telephone Encounter (Signed)
per pof to sch pt appt-sch mamma and mailed copy of avs

## 2016-01-30 ENCOUNTER — Encounter: Payer: Self-pay | Admitting: Cardiology

## 2016-02-03 ENCOUNTER — Encounter (HOSPITAL_COMMUNITY): Payer: Self-pay

## 2016-02-03 ENCOUNTER — Encounter (HOSPITAL_COMMUNITY)
Admission: RE | Admit: 2016-02-03 | Discharge: 2016-02-03 | Disposition: A | Discharge status: Home / Self Care | Payer: Medicare Other | Source: Ambulatory Visit | Attending: Orthopedic Surgery | Admitting: Orthopedic Surgery

## 2016-02-03 ENCOUNTER — Ambulatory Visit (HOSPITAL_COMMUNITY)
Admission: RE | Admit: 2016-02-03 | Discharge: 2016-02-03 | Disposition: A | Discharge status: Home / Self Care | Payer: Medicare Other | Source: Ambulatory Visit | Attending: Orthopedic Surgery | Admitting: Orthopedic Surgery

## 2016-02-03 DIAGNOSIS — Z79899 Other long term (current) drug therapy: Secondary | ICD-10-CM | POA: Diagnosis not present

## 2016-02-03 DIAGNOSIS — J984 Other disorders of lung: Secondary | ICD-10-CM | POA: Insufficient documentation

## 2016-02-03 DIAGNOSIS — Z01818 Encounter for other preprocedural examination: Secondary | ICD-10-CM | POA: Insufficient documentation

## 2016-02-03 DIAGNOSIS — I517 Cardiomegaly: Secondary | ICD-10-CM | POA: Diagnosis not present

## 2016-02-03 DIAGNOSIS — Z7982 Long term (current) use of aspirin: Secondary | ICD-10-CM | POA: Diagnosis not present

## 2016-02-03 DIAGNOSIS — E785 Hyperlipidemia, unspecified: Secondary | ICD-10-CM | POA: Insufficient documentation

## 2016-02-03 DIAGNOSIS — I10 Essential (primary) hypertension: Secondary | ICD-10-CM | POA: Insufficient documentation

## 2016-02-03 DIAGNOSIS — F329 Major depressive disorder, single episode, unspecified: Secondary | ICD-10-CM | POA: Diagnosis not present

## 2016-02-03 DIAGNOSIS — R739 Hyperglycemia, unspecified: Secondary | ICD-10-CM | POA: Diagnosis not present

## 2016-02-03 HISTORY — DX: Fibromyalgia: M79.7

## 2016-02-03 HISTORY — DX: Pneumonia, unspecified organism: J18.9

## 2016-02-03 LAB — COMPREHENSIVE METABOLIC PANEL
ALT: 29 U/L (ref 14–54)
ANION GAP: 5 (ref 5–15)
AST: 33 U/L (ref 15–41)
Albumin: 4.1 g/dL (ref 3.5–5.0)
Alkaline Phosphatase: 125 U/L (ref 38–126)
BUN: 9 mg/dL (ref 6–20)
CHLORIDE: 103 mmol/L (ref 101–111)
CO2: 31 mmol/L (ref 22–32)
CREATININE: 0.81 mg/dL (ref 0.44–1.00)
Calcium: 10.1 mg/dL (ref 8.9–10.3)
Glucose, Bld: 102 mg/dL — ABNORMAL HIGH (ref 65–99)
POTASSIUM: 3.7 mmol/L (ref 3.5–5.1)
SODIUM: 139 mmol/L (ref 135–145)
Total Bilirubin: 0.6 mg/dL (ref 0.3–1.2)
Total Protein: 7.5 g/dL (ref 6.5–8.1)

## 2016-02-03 LAB — CBC WITH DIFFERENTIAL/PLATELET
Basophils Absolute: 0 10*3/uL (ref 0.0–0.1)
Basophils Relative: 1 %
EOS ABS: 0.3 10*3/uL (ref 0.0–0.7)
EOS PCT: 7 %
HCT: 40.1 % (ref 36.0–46.0)
Hemoglobin: 12.8 g/dL (ref 12.0–15.0)
LYMPHS ABS: 1 10*3/uL (ref 0.7–4.0)
LYMPHS PCT: 26 %
MCH: 27.9 pg (ref 26.0–34.0)
MCHC: 31.9 g/dL (ref 30.0–36.0)
MCV: 87.4 fL (ref 78.0–100.0)
MONO ABS: 0.3 10*3/uL (ref 0.1–1.0)
Monocytes Relative: 7 %
Neutro Abs: 2.3 10*3/uL (ref 1.7–7.7)
Neutrophils Relative %: 59 %
PLATELETS: 202 10*3/uL (ref 150–400)
RBC: 4.59 MIL/uL (ref 3.87–5.11)
RDW: 12.7 % (ref 11.5–15.5)
WBC: 3.8 10*3/uL — AB (ref 4.0–10.5)

## 2016-02-03 LAB — URINALYSIS, ROUTINE W REFLEX MICROSCOPIC
Bilirubin Urine: NEGATIVE
GLUCOSE, UA: NEGATIVE mg/dL
HGB URINE DIPSTICK: NEGATIVE
KETONES UR: NEGATIVE mg/dL
LEUKOCYTES UA: NEGATIVE
Nitrite: NEGATIVE
PH: 6 (ref 5.0–8.0)
Protein, ur: NEGATIVE mg/dL
Specific Gravity, Urine: 1.021 (ref 1.005–1.030)

## 2016-02-03 LAB — TYPE AND SCREEN
ABO/RH(D): O POS
ANTIBODY SCREEN: NEGATIVE

## 2016-02-03 LAB — PROTIME-INR
INR: 0.99 (ref 0.00–1.49)
PROTHROMBIN TIME: 13.3 s (ref 11.6–15.2)

## 2016-02-03 LAB — SURGICAL PCR SCREEN
MRSA, PCR: NEGATIVE
Staphylococcus aureus: NEGATIVE

## 2016-02-03 LAB — ABO/RH: ABO/RH(D): O POS

## 2016-02-03 LAB — APTT: aPTT: 31 seconds (ref 24–37)

## 2016-02-03 NOTE — Progress Notes (Signed)
Dr. Ruel Favors office notified that  We need clarification for consent,order states arthroscopy instead of arthroplasty,  Cardiac Clearance letter requested and received from Dr. Ruel Favors office.

## 2016-02-03 NOTE — Pre-Procedure Instructions (Signed)
    [name]  02/03/2016      [facility] - [place], [place] - [address] [place] [address] Phone: [phone] Fax: [phone]  [facility] [place], [address] [place] [address] Phone: [phone] Fax: [phone]    Your procedure is scheduled on 02-13-2016    Monday   Report to [facility] Admitting at 5:30A.M.   Call this number if you have problems the morning of surgery:  [phone]   Remember:  Do not eat food or drink liquids after midnight.   Take these medicines the morning of surgery with A SIP OF WATER Flonase nasal spray if needed,levothyroxine(Synthroid),Metoprolol(Toprol XL),protonix,             STOP ASPIRIN,ANTI-INFLAMMATORIES,VITAMINS AND HERBAL SUPPLEMENTS 5-7 DAYS PRIOR TO SURGERY.      Do not wear jewelry, make-up or nail polish.  Do not wear lotions, powders, or perfumes.  You may not wear deoderant.   Do not shave 48 hours prior to surgery.     Do not bring valuables to the hospital.  [facility] is not responsible for any belongings or valuables.  Contacts, dentures or bridgework may not be worn into surgery.  Leave your suitcase in the car.  After surgery it may be brought to your room.  For patients admitted to the hospital, discharge time will be determined by your treatment team.  Patients discharged the day of surgery will not be allowed to drive home.    Special instructions:  See attached Sheet for instructions on CHG shower  Please read over the following fact sheets that you were given. Coughing and Deep Breathing and MRSA Information

## 2016-02-03 NOTE — Progress Notes (Signed)
[name] [employer]) notified of date and time of surgery.

## 2016-02-04 LAB — URINE CULTURE

## 2016-02-06 NOTE — Progress Notes (Signed)
Anesthesia Chart Review: Patient is 65 year old female posted for left TKA on 02/13/16 by Dr. Ronnie Derby.  PMH includes non-Q wave MI/Takotsubo syndrome 12/2003, PFO with left-to-right shunting with negative bubble study (TEE 12/2003), RLL PE 12/2003, paroxysmal ventricular tachycardia, s/p AICD [employer]) 01/21/04 (generator change 12/22/09),  HTN, TIA 11/2003, right ophthalmic artery cerebral aneurysm clipping (Dr. Carloyn Manner) '00, OSA (no CPAP; referred for oral appliance 11/15/15), hyperlipidemia, hypothyroidism, bronchiectasis (question sarcoidosis, but no tissue sampling ever done), nonalcoholic liver disease, hiatal hernia, GERD, fibromyalgia, PUD/GI bleed '04, depression, glaucoma, right breast DCIS s/p right lumpectomy 01/03/15 and radiation, hysterectomy. Former smoker. BMI 32.  - PCP is Dr. Loura Pardon. She signed a note of medical clearance.  - Cardiologist is Dr. Virl Axe. He signed a note of cardiac clearance with recommendation that surgery be performed at [facility] due to her cardiac history.  - Pulmonologist is Dr. Chesley Mires. He signed a note of pulmonology clearance stating that "Has mild sleep apnea does not preclude her having surgery. She need close monitoring of her oxygen after surgery." - Hematologist is Dr. Truitt Merle.   Medications include: ASA, levothyroxine, Allegra, Flonase, HCTZ, Xalatan ophthalmic, Toprol XL, Protonix, KCl, Zocor, tramadol, Ambien.   11/15/15 EKG: NSR. Negative septal T waves (old).   12/30/14 24 Hour Holter monitor: SR/SB, first degree AVB. 73 PACs, 2898 PVCs with 5 couplets. Infrequent ectopy. Continue current medications.   12/07/14 Echo:  - Left ventricle: The cavity size was normal. Wall thickness was normal. Systolic function was normal. The estimated ejection fraction was in the range of 50% to 55%. Wall motion was normal; there were no regional wall motion abnormalities. Doppler parameters are consistent with abnormal left ventricular relaxation (grade 1  diastolic dysfunction). - Pulmonary arteries: Systolic pressure was mildly increased. PA peak pressure: 36 mm Hg (S). Impressions:- Low normal LV function; EF 0000000; grade 1 diastolic dysfunction;trace MR and TR; mildly elevated pulmonary pressure. (01/18/04 TEE showed moderate sized PFO with left-to-right shunting and negative bubble study. EF 35%.)  02/22/12 Cardiac cath: Impression: 1. No angiographic evidence of disease.  2. Mild LV systolic dysfunction. LVEF 45%. 3. Non-cardiac chest pain Recommendations: No further ischemic workup. Follow up with Dr. Caryl Comes as planned.   02/03/16 CXR: IMPRESSION: - Borderline enlargement of cardiac silhouette. - RIGHT upper lobe scarring without acute infiltrate.  Preoperative labs noted. Urine culture showed multiple species present, suggest recollection.  Discussed cardiac/PFO history with anesthesiologist Dr. Linna Caprice. Patient has clearances from her PCP, cardiologist, and pulmonologist. If no acute changes then it is anticipated that she can proceed as planned. Case is posted for spinal anesthesia. Further evaluation on the day of surgery.  [facility] Short Stay Center/Anesthesiology Phone [phone] 02/06/2016 2:23 PM

## 2016-02-10 ENCOUNTER — Other Ambulatory Visit: Payer: Self-pay | Admitting: Orthopedic Surgery

## 2016-02-10 MED ORDER — SODIUM CHLORIDE 0.9 % IV SOLN
2000.0000 mg | INTRAVENOUS | Status: AC
  Administered 2016-02-13: 2000 mg via TOPICAL
  Filled 2016-02-10: qty 20

## 2016-02-10 MED ORDER — ACETAMINOPHEN 500 MG PO TABS
1000.0000 mg | ORAL_TABLET | Freq: Once | ORAL | Status: AC
  Administered 2016-02-13: 1000 mg via ORAL
  Filled 2016-02-10: qty 2

## 2016-02-10 MED ORDER — SODIUM CHLORIDE 0.9 % IV SOLN: INTRAVENOUS | Status: DC

## 2016-02-10 MED ORDER — CEFAZOLIN SODIUM-DEXTROSE 2-4 GM/100ML-% IV SOLN
2.0000 g | INTRAVENOUS | Status: AC
  Administered 2016-02-13: 2 g via INTRAVENOUS
  Filled 2016-02-10: qty 100

## 2016-02-10 MED ORDER — BUPIVACAINE LIPOSOME 1.3 % IJ SUSP
20.0000 mL | INTRAMUSCULAR | Status: AC
  Administered 2016-02-13: 20 mL
  Filled 2016-02-10: qty 20

## 2016-02-10 NOTE — Progress Notes (Signed)
PHARMACY NOTE:  DRUG ALERT RE: CONTRAINDICATION to IV [systemic] TRANEXAMIC ACID  -- Patient has a history of PE, CVA.[name] Under the Revised as well as the Old EXCLUSION CRITERIA for IV [systemic]Tranexamic Acid, in ALL cases of Pts with a past medical history of PE, MI, CVA, or thrombophilia, Tranexamic Acid is CONTRAINDICATED.  The order for IV Tranexamic Acid will not be released due to patient's Contraindications to IV TXA.   -- TOPICAL Tranexamic Acid has been substituted for the IV TXA.   If Topical TXA is not indicated in this case, please Discontinue the order.   -- For any questions or concerns, please contact the [facility].  -- Thank you.   Marthenia Rolling, Pharm.D. [facility] Pharmacist [phone]

## 2016-02-12 NOTE — Anesthesia Preprocedure Evaluation (Signed)
Anesthesia Evaluation  Patient identified by MRN, date of birth, ID band Patient awake    Reviewed: Allergy & Precautions, H&P , NPO status , Patient's Chart, lab work & pertinent test results, reviewed documented beta blocker date and time   Airway Mallampati: II  TM Distance: >3 FB Neck ROM: Full    Dental no notable dental hx.    Pulmonary sleep apnea , former smoker,    Pulmonary exam normal breath sounds clear to auscultation       Cardiovascular Exercise Tolerance: Good hypertension, Pt. on medications and Pt. on home beta blockers + Peripheral Vascular Disease  + Cardiac Defibrillator  Rhythm:Regular Rate:Normal  Echo 11/2014:  - Left ventricle:  Systolic function was normal.  EF 50% to 55%. Wall motion was normal; there were no regional wall motion abnormalities........grade 1 diastolic dysfunction). - Pulmonary arteries: Systolic pressure was mildly increased. PA peak pressure: 36 mm Hg (S).     Neuro/Psych Anxiety Depression CVA    GI/Hepatic Neg liver ROS, hiatal hernia, PUD, GERD  ,  Endo/Other  Hypothyroidism Morbid obesity  Renal/GU negative Renal ROS     Musculoskeletal  (+) Arthritis ,   Abdominal   Peds  Hematology negative hematology ROS (+)   Anesthesia Other Findings Paroxysmal ventricular tachycardia     Unspecified transient cerebral ischemia  Takotsubo syndrome   pulmonary embolism and infarction   chronic nonalcoholic liver disease    Cerebral aneurysm, nonruptured    Hypertension   Obstructive sleep apnea    AICD (automatic cardioverter/defibrillator) present     Stroke 2013 tia  History of hiatal hernia     GERD     Reproductive/Obstetrics                             Anesthesia Physical  Anesthesia Plan  ASA: III  Anesthesia Plan: Spinal   Post-op Pain Management:    Induction:   Airway Management Planned: Natural Airway  Additional  Equipment:   Intra-op Plan:   Post-operative Plan:   Informed Consent: I have reviewed the patients History and Physical, chart, labs and discussed the procedure including the risks, benefits and alternatives for the proposed anesthesia with the patient or authorized representative who has indicated his/her understanding and acceptance.     Plan Discussed with: CRNA  Anesthesia Plan Comments:         Anesthesia Quick Evaluation

## 2016-02-13 ENCOUNTER — Inpatient Hospital Stay (HOSPITAL_COMMUNITY): Payer: Medicare Other | Admitting: Vascular Surgery

## 2016-02-13 ENCOUNTER — Inpatient Hospital Stay (HOSPITAL_COMMUNITY)
Admission: RE | Admit: 2016-02-13 | Discharge: 2016-02-14 | DRG: 470 | Disposition: A | Discharge status: Home Health | Payer: Medicare Other | Source: Ambulatory Visit | Attending: Orthopedic Surgery | Admitting: Orthopedic Surgery

## 2016-02-13 ENCOUNTER — Inpatient Hospital Stay (HOSPITAL_COMMUNITY): Payer: Medicare Other | Admitting: Anesthesiology

## 2016-02-13 ENCOUNTER — Encounter (HOSPITAL_COMMUNITY)
Admission: RE | Disposition: A | Discharge status: Home / Self Care | Payer: Self-pay | Source: Ambulatory Visit | Attending: Orthopedic Surgery

## 2016-02-13 DIAGNOSIS — G4733 Obstructive sleep apnea (adult) (pediatric): Secondary | ICD-10-CM | POA: Diagnosis present

## 2016-02-13 DIAGNOSIS — E785 Hyperlipidemia, unspecified: Secondary | ICD-10-CM | POA: Diagnosis present

## 2016-02-13 DIAGNOSIS — Z95810 Presence of automatic (implantable) cardiac defibrillator: Secondary | ICD-10-CM | POA: Diagnosis not present

## 2016-02-13 DIAGNOSIS — Z96659 Presence of unspecified artificial knee joint: Secondary | ICD-10-CM

## 2016-02-13 DIAGNOSIS — D62 Acute posthemorrhagic anemia: Secondary | ICD-10-CM | POA: Diagnosis not present

## 2016-02-13 DIAGNOSIS — F329 Major depressive disorder, single episode, unspecified: Secondary | ICD-10-CM | POA: Diagnosis present

## 2016-02-13 DIAGNOSIS — Z7982 Long term (current) use of aspirin: Secondary | ICD-10-CM | POA: Diagnosis not present

## 2016-02-13 DIAGNOSIS — Z885 Allergy status to narcotic agent status: Secondary | ICD-10-CM

## 2016-02-13 DIAGNOSIS — Z8711 Personal history of peptic ulcer disease: Secondary | ICD-10-CM | POA: Diagnosis not present

## 2016-02-13 DIAGNOSIS — M1712 Unilateral primary osteoarthritis, left knee: Principal | ICD-10-CM | POA: Diagnosis present

## 2016-02-13 DIAGNOSIS — E559 Vitamin D deficiency, unspecified: Secondary | ICD-10-CM | POA: Diagnosis present

## 2016-02-13 DIAGNOSIS — Z8673 Personal history of transient ischemic attack (TIA), and cerebral infarction without residual deficits: Secondary | ICD-10-CM

## 2016-02-13 DIAGNOSIS — Z79899 Other long term (current) drug therapy: Secondary | ICD-10-CM

## 2016-02-13 DIAGNOSIS — E039 Hypothyroidism, unspecified: Secondary | ICD-10-CM | POA: Diagnosis present

## 2016-02-13 DIAGNOSIS — M797 Fibromyalgia: Secondary | ICD-10-CM | POA: Diagnosis present

## 2016-02-13 DIAGNOSIS — Z888 Allergy status to other drugs, medicaments and biological substances status: Secondary | ICD-10-CM | POA: Diagnosis not present

## 2016-02-13 DIAGNOSIS — K219 Gastro-esophageal reflux disease without esophagitis: Secondary | ICD-10-CM | POA: Diagnosis present

## 2016-02-13 DIAGNOSIS — Z87891 Personal history of nicotine dependence: Secondary | ICD-10-CM | POA: Diagnosis not present

## 2016-02-13 DIAGNOSIS — Z86711 Personal history of pulmonary embolism: Secondary | ICD-10-CM

## 2016-02-13 DIAGNOSIS — Z853 Personal history of malignant neoplasm of breast: Secondary | ICD-10-CM

## 2016-02-13 HISTORY — PX: TOTAL KNEE ARTHROPLASTY: SHX125

## 2016-02-13 SURGERY — ARTHROPLASTY, KNEE, TOTAL
Anesthesia: Spinal | Laterality: Left

## 2016-02-13 MED ORDER — FENTANYL CITRATE (PF) 100 MCG/2ML IJ SOLN
INTRAMUSCULAR | Status: DC | PRN
  Administered 2016-02-13 (×2): 50 ug via INTRAVENOUS

## 2016-02-13 MED ORDER — ONDANSETRON HCL 4 MG/2ML IJ SOLN
4.0000 mg | Freq: Four times a day (QID) | INTRAMUSCULAR | Status: DC | PRN
  Administered 2016-02-13: 4 mg via INTRAVENOUS
  Filled 2016-02-13: qty 2

## 2016-02-13 MED ORDER — DEXAMETHASONE SODIUM PHOSPHATE 10 MG/ML IJ SOLN
10.0000 mg | Freq: Once | INTRAMUSCULAR | Status: AC
  Administered 2016-02-14: 10 mg via INTRAVENOUS
  Filled 2016-02-13 (×2): qty 1

## 2016-02-13 MED ORDER — PHENYLEPHRINE HCL 10 MG/ML IJ SOLN
INTRAMUSCULAR | Status: DC | PRN
  Administered 2016-02-13 (×2): 40 ug via INTRAVENOUS
  Administered 2016-02-13: 80 ug via INTRAVENOUS
  Administered 2016-02-13 (×3): 40 ug via INTRAVENOUS
  Administered 2016-02-13: 80 ug via INTRAVENOUS
  Administered 2016-02-13: 40 ug via INTRAVENOUS

## 2016-02-13 MED ORDER — SODIUM CHLORIDE 0.9 % IR SOLN
Status: DC | PRN
  Administered 2016-02-13: 1000 mL

## 2016-02-13 MED ORDER — MIDAZOLAM HCL 5 MG/5ML IJ SOLN
INTRAMUSCULAR | Status: DC | PRN
  Administered 2016-02-13: 2 mg via INTRAVENOUS

## 2016-02-13 MED ORDER — ONDANSETRON HCL 4 MG/2ML IJ SOLN
INTRAMUSCULAR | Status: DC | PRN
  Administered 2016-02-13: 4 mg via INTRAVENOUS

## 2016-02-13 MED ORDER — METOCLOPRAMIDE HCL 5 MG/ML IJ SOLN: 5.0000 mg | Freq: Three times a day (TID) | INTRAMUSCULAR | Status: DC | PRN

## 2016-02-13 MED ORDER — ACETAMINOPHEN 325 MG PO TABS: 650.0000 mg | ORAL_TABLET | Freq: Four times a day (QID) | ORAL | Status: DC | PRN

## 2016-02-13 MED ORDER — METOCLOPRAMIDE HCL 5 MG PO TABS: 5.0000 mg | ORAL_TABLET | Freq: Three times a day (TID) | ORAL | Status: DC | PRN

## 2016-02-13 MED ORDER — POTASSIUM CHLORIDE CRYS ER 10 MEQ PO TBCR
10.0000 meq | EXTENDED_RELEASE_TABLET | Freq: Every day | ORAL | Status: DC
  Administered 2016-02-13 – 2016-02-14 (×2): 10 meq via ORAL
  Filled 2016-02-13 (×2): qty 1

## 2016-02-13 MED ORDER — METOPROLOL SUCCINATE ER 50 MG PO TB24
50.0000 mg | ORAL_TABLET | Freq: Every day | ORAL | Status: DC
  Administered 2016-02-13: 50 mg via ORAL
  Filled 2016-02-13 (×2): qty 1

## 2016-02-13 MED ORDER — ACETAMINOPHEN 650 MG RE SUPP: 650.0000 mg | Freq: Four times a day (QID) | RECTAL | Status: DC | PRN

## 2016-02-13 MED ORDER — SODIUM CHLORIDE 0.9 % IJ SOLN
INTRAMUSCULAR | Status: DC | PRN
  Administered 2016-02-13 (×2): 10 mL

## 2016-02-13 MED ORDER — CEFAZOLIN IN D5W 1 GM/50ML IV SOLN
1.0000 g | Freq: Four times a day (QID) | INTRAVENOUS | Status: AC
  Administered 2016-02-13 (×2): 1 g via INTRAVENOUS
  Filled 2016-02-13 (×2): qty 50

## 2016-02-13 MED ORDER — SIMVASTATIN 20 MG PO TABS
20.0000 mg | ORAL_TABLET | Freq: Every day | ORAL | Status: DC
  Administered 2016-02-13: 20 mg via ORAL
  Filled 2016-02-13 (×2): qty 1

## 2016-02-13 MED ORDER — LATANOPROST 0.005 % OP SOLN
1.0000 [drp] | Freq: Every day | OPHTHALMIC | Status: DC
  Administered 2016-02-13: 1 [drp] via OPHTHALMIC
  Filled 2016-02-13: qty 2.5

## 2016-02-13 MED ORDER — METHOCARBAMOL 1000 MG/10ML IJ SOLN
500.0000 mg | Freq: Four times a day (QID) | INTRAVENOUS | Status: DC | PRN
  Filled 2016-02-13: qty 5

## 2016-02-13 MED ORDER — LEVOTHYROXINE SODIUM 50 MCG PO TABS
50.0000 ug | ORAL_TABLET | Freq: Every day | ORAL | Status: DC
  Administered 2016-02-14: 50 ug via ORAL
  Filled 2016-02-13 (×2): qty 1

## 2016-02-13 MED ORDER — ALUM & MAG HYDROXIDE-SIMETH 200-200-20 MG/5ML PO SUSP: 30.0000 mL | ORAL | Status: DC | PRN

## 2016-02-13 MED ORDER — BUPIVACAINE-EPINEPHRINE (PF) 0.5% -1:200000 IJ SOLN
INTRAMUSCULAR | Status: AC
  Filled 2016-02-13: qty 30

## 2016-02-13 MED ORDER — METHOCARBAMOL 500 MG PO TABS
500.0000 mg | ORAL_TABLET | Freq: Four times a day (QID) | ORAL | Status: DC | PRN
  Administered 2016-02-13 – 2016-02-14 (×4): 500 mg via ORAL
  Filled 2016-02-13 (×4): qty 1

## 2016-02-13 MED ORDER — OXYCODONE HCL 5 MG PO TABS
5.0000 mg | ORAL_TABLET | ORAL | Status: DC | PRN
  Administered 2016-02-13 – 2016-02-14 (×4): 10 mg via ORAL
  Filled 2016-02-13 (×4): qty 2

## 2016-02-13 MED ORDER — SENNOSIDES-DOCUSATE SODIUM 8.6-50 MG PO TABS: 1.0000 | ORAL_TABLET | Freq: Every evening | ORAL | Status: DC | PRN

## 2016-02-13 MED ORDER — ACETAMINOPHEN 500 MG PO TABS
1000.0000 mg | ORAL_TABLET | Freq: Four times a day (QID) | ORAL | Status: AC
  Administered 2016-02-13 (×3): 1000 mg via ORAL
  Filled 2016-02-13 (×4): qty 2

## 2016-02-13 MED ORDER — ONDANSETRON HCL 4 MG PO TABS: 4.0000 mg | ORAL_TABLET | Freq: Four times a day (QID) | ORAL | Status: DC | PRN

## 2016-02-13 MED ORDER — DOCUSATE SODIUM 100 MG PO CAPS
100.0000 mg | ORAL_CAPSULE | Freq: Two times a day (BID) | ORAL | Status: DC
  Administered 2016-02-13 – 2016-02-14 (×3): 100 mg via ORAL
  Filled 2016-02-13 (×3): qty 1

## 2016-02-13 MED ORDER — PROPOFOL 500 MG/50ML IV EMUL
INTRAVENOUS | Status: DC | PRN
  Administered 2016-02-13: 25 ug/kg/min via INTRAVENOUS

## 2016-02-13 MED ORDER — HYDROMORPHONE HCL 1 MG/ML IJ SOLN
1.0000 mg | INTRAMUSCULAR | Status: DC | PRN
  Administered 2016-02-13 – 2016-02-14 (×4): 1 mg via INTRAVENOUS
  Filled 2016-02-13 (×4): qty 1

## 2016-02-13 MED ORDER — FLUTICASONE PROPIONATE 50 MCG/ACT NA SUSP
2.0000 | Freq: Every day | NASAL | Status: DC | PRN
  Filled 2016-02-13: qty 16

## 2016-02-13 MED ORDER — ZOLPIDEM TARTRATE 5 MG PO TABS
5.0000 mg | ORAL_TABLET | Freq: Every evening | ORAL | Status: DC | PRN
  Administered 2016-02-13: 5 mg via ORAL
  Filled 2016-02-13: qty 1

## 2016-02-13 MED ORDER — FENTANYL CITRATE (PF) 100 MCG/2ML IJ SOLN: 25.0000 ug | INTRAMUSCULAR | Status: DC | PRN

## 2016-02-13 MED ORDER — MENTHOL 3 MG MT LOZG: 1.0000 | LOZENGE | OROMUCOSAL | Status: DC | PRN

## 2016-02-13 MED ORDER — FENTANYL CITRATE (PF) 250 MCG/5ML IJ SOLN
INTRAMUSCULAR | Status: AC
  Filled 2016-02-13: qty 5

## 2016-02-13 MED ORDER — DIPHENHYDRAMINE HCL 50 MG/ML IJ SOLN
INTRAMUSCULAR | Status: DC | PRN
  Administered 2016-02-13: 25 mg via INTRAVENOUS

## 2016-02-13 MED ORDER — BUPIVACAINE-EPINEPHRINE 0.5% -1:200000 IJ SOLN
INTRAMUSCULAR | Status: DC | PRN
  Administered 2016-02-13: 20 mL

## 2016-02-13 MED ORDER — PHENOL 1.4 % MT LIQD: 1.0000 | OROMUCOSAL | Status: DC | PRN

## 2016-02-13 MED ORDER — 0.9 % SODIUM CHLORIDE (POUR BTL) OPTIME
TOPICAL | Status: DC | PRN
  Administered 2016-02-13: 1000 mL

## 2016-02-13 MED ORDER — LACTATED RINGERS IV SOLN
INTRAVENOUS | Status: DC | PRN
  Administered 2016-02-13 (×2): via INTRAVENOUS

## 2016-02-13 MED ORDER — BISACODYL 5 MG PO TBEC: 5.0000 mg | DELAYED_RELEASE_TABLET | Freq: Every day | ORAL | Status: DC | PRN

## 2016-02-13 MED ORDER — ASPIRIN EC 325 MG PO TBEC
325.0000 mg | DELAYED_RELEASE_TABLET | Freq: Two times a day (BID) | ORAL | Status: DC
  Administered 2016-02-13 – 2016-02-14 (×3): 325 mg via ORAL
  Filled 2016-02-13 (×3): qty 1

## 2016-02-13 MED ORDER — BUPIVACAINE-EPINEPHRINE (PF) 0.25% -1:200000 IJ SOLN
INTRAMUSCULAR | Status: AC
  Filled 2016-02-13: qty 30

## 2016-02-13 MED ORDER — DIPHENHYDRAMINE HCL 12.5 MG/5ML PO ELIX: 12.5000 mg | ORAL_SOLUTION | ORAL | Status: DC | PRN

## 2016-02-13 MED ORDER — MIDAZOLAM HCL 2 MG/2ML IJ SOLN
INTRAMUSCULAR | Status: AC
  Filled 2016-02-13: qty 2

## 2016-02-13 MED ORDER — HYDROCHLOROTHIAZIDE 25 MG PO TABS
25.0000 mg | ORAL_TABLET | Freq: Every day | ORAL | Status: DC
  Administered 2016-02-13 – 2016-02-14 (×2): 25 mg via ORAL
  Filled 2016-02-13 (×2): qty 1

## 2016-02-13 MED ORDER — PANTOPRAZOLE SODIUM 40 MG PO TBEC
40.0000 mg | DELAYED_RELEASE_TABLET | Freq: Two times a day (BID) | ORAL | Status: DC
  Administered 2016-02-13 – 2016-02-14 (×3): 40 mg via ORAL
  Filled 2016-02-13 (×3): qty 1

## 2016-02-13 MED ORDER — SODIUM CHLORIDE 0.9 % IV SOLN: INTRAVENOUS | Status: DC

## 2016-02-13 MED ORDER — LIDOCAINE HCL (CARDIAC) 20 MG/ML IV SOLN
INTRAVENOUS | Status: DC | PRN
  Administered 2016-02-13: 40 mg via INTRAVENOUS

## 2016-02-13 MED ORDER — FLEET ENEMA 7-19 GM/118ML RE ENEM: 1.0000 | ENEMA | Freq: Once | RECTAL | Status: DC | PRN

## 2016-02-13 MED ORDER — PROPOFOL 10 MG/ML IV BOLUS
INTRAVENOUS | Status: AC
  Filled 2016-02-13: qty 20

## 2016-02-13 MED ORDER — OXYCODONE HCL ER 10 MG PO T12A
10.0000 mg | EXTENDED_RELEASE_TABLET | Freq: Two times a day (BID) | ORAL | Status: DC
  Administered 2016-02-13 – 2016-02-14 (×3): 10 mg via ORAL
  Filled 2016-02-13 (×3): qty 1

## 2016-02-13 MED ORDER — EPHEDRINE SULFATE 50 MG/ML IJ SOLN
INTRAMUSCULAR | Status: DC | PRN
  Administered 2016-02-13 (×10): 5 mg via INTRAVENOUS

## 2016-02-13 SURGICAL SUPPLY — 67 items
APL SKNCLS STERI-STRIP NONHPOA (GAUZE/BANDAGES/DRESSINGS) ×1
BANDAGE ELASTIC 6 VELCRO ST LF (GAUZE/BANDAGES/DRESSINGS) ×2 IMPLANT
BANDAGE ESMARK 6X9 LF (GAUZE/BANDAGES/DRESSINGS) ×1 IMPLANT
BENZOIN TINCTURE PRP APPL 2/3 (GAUZE/BANDAGES/DRESSINGS) ×2 IMPLANT
BLADE SAGITTAL 13X1.27X60 (BLADE) ×2 IMPLANT
BLADE SAGITTAL 13X1.27X60MM (BLADE) ×1
BLADE SAW SGTL 83.5X18.5 (BLADE) ×3 IMPLANT
BLADE SURG 10 STRL SS (BLADE) ×3 IMPLANT
BNDG CMPR 9X6 STRL LF SNTH (GAUZE/BANDAGES/DRESSINGS) ×1
BNDG ESMARK 6X9 LF (GAUZE/BANDAGES/DRESSINGS) ×3
BOWL SMART MIX CTS (DISPOSABLE) ×3 IMPLANT
CAPT KNEE TOTAL 3 ×3 IMPLANT
CEMENT BONE SIMPLEX SPEEDSET (Cement) ×6 IMPLANT
CLOSURE STERI-STRIP 1/2X4 (GAUZE/BANDAGES/DRESSINGS) ×1
CLSR STERI-STRIP ANTIMIC 1/2X4 (GAUZE/BANDAGES/DRESSINGS) ×1 IMPLANT
COVER SURGICAL LIGHT HANDLE (MISCELLANEOUS) ×3 IMPLANT
CUFF TOURNIQUET SINGLE 34IN LL (TOURNIQUET CUFF) ×3 IMPLANT
DRAPE EXTREMITY T 121X128X90 (DRAPE) ×3 IMPLANT
DRAPE INCISE IOBAN 66X45 STRL (DRAPES) ×6 IMPLANT
DRAPE PROXIMA HALF (DRAPES) IMPLANT
DRAPE U-SHAPE 47X51 STRL (DRAPES) ×3 IMPLANT
DRESSING AQUACEL AG SP 3.5X10 (GAUZE/BANDAGES/DRESSINGS) IMPLANT
DRSG ADAPTIC 3X8 NADH LF (GAUZE/BANDAGES/DRESSINGS) ×1 IMPLANT
DRSG AQUACEL AG SP 3.5X10 (GAUZE/BANDAGES/DRESSINGS) ×3
DRSG PAD ABDOMINAL 8X10 ST (GAUZE/BANDAGES/DRESSINGS) ×1 IMPLANT
DURAPREP 26ML APPLICATOR (WOUND CARE) ×6 IMPLANT
ELECT REM PT RETURN 9FT ADLT (ELECTROSURGICAL) ×3
ELECTRODE REM PT RTRN 9FT ADLT (ELECTROSURGICAL) ×1 IMPLANT
GAUZE SPONGE 4X4 12PLY STRL (GAUZE/BANDAGES/DRESSINGS) ×3 IMPLANT
GLOVE BIOGEL M 7.0 STRL (GLOVE) IMPLANT
GLOVE BIOGEL PI IND STRL 7.5 (GLOVE) IMPLANT
GLOVE BIOGEL PI IND STRL 8.5 (GLOVE) ×5 IMPLANT
GLOVE BIOGEL PI INDICATOR 7.5 (GLOVE)
GLOVE BIOGEL PI INDICATOR 8.5 (GLOVE) ×10
GLOVE SURG ORTHO 8.0 STRL STRW (GLOVE) ×18 IMPLANT
GOWN STRL REUS W/ TWL LRG LVL3 (GOWN DISPOSABLE) ×1 IMPLANT
GOWN STRL REUS W/ TWL XL LVL3 (GOWN DISPOSABLE) ×2 IMPLANT
GOWN STRL REUS W/TWL 2XL LVL3 (GOWN DISPOSABLE) ×3 IMPLANT
GOWN STRL REUS W/TWL LRG LVL3 (GOWN DISPOSABLE) ×3
GOWN STRL REUS W/TWL XL LVL3 (GOWN DISPOSABLE) ×6
HANDPIECE INTERPULSE COAX TIP (DISPOSABLE) ×3
HOOD PEEL AWAY FACE SHEILD DIS (HOOD) ×9 IMPLANT
KIT BASIN OR (CUSTOM PROCEDURE TRAY) ×3 IMPLANT
KIT ROOM TURNOVER OR (KITS) ×3 IMPLANT
KNEE CAPITATED TOTAL 3 IMPLANT
MANIFOLD NEPTUNE II (INSTRUMENTS) ×3 IMPLANT
NEEDLE 22X1 1/2 (OR ONLY) (NEEDLE) ×6 IMPLANT
NS IRRIG 1000ML POUR BTL (IV SOLUTION) ×3 IMPLANT
PACK TOTAL JOINT (CUSTOM PROCEDURE TRAY) ×3 IMPLANT
PACK UNIVERSAL I (CUSTOM PROCEDURE TRAY) ×3 IMPLANT
PAD ARMBOARD 7.5X6 YLW CONV (MISCELLANEOUS) ×6 IMPLANT
PADDING CAST COTTON 6X4 STRL (CAST SUPPLIES) ×1 IMPLANT
SET HNDPC FAN SPRY TIP SCT (DISPOSABLE) ×1 IMPLANT
STAPLER VISISTAT 35W (STAPLE) ×1 IMPLANT
SUCTION FRAZIER HANDLE 10FR (MISCELLANEOUS) ×2
SUCTION TUBE FRAZIER 10FR DISP (MISCELLANEOUS) ×1 IMPLANT
SUT BONE WAX W31G (SUTURE) ×3 IMPLANT
SUT MNCRL AB 3-0 PS2 18 (SUTURE) ×2 IMPLANT
SUT VIC AB 0 CTB1 27 (SUTURE) ×6 IMPLANT
SUT VIC AB 1 CT1 27 (SUTURE) ×9
SUT VIC AB 1 CT1 27XBRD ANBCTR (SUTURE) ×2 IMPLANT
SUT VIC AB 2-0 CT1 27 (SUTURE) ×6
SUT VIC AB 2-0 CT1 TAPERPNT 27 (SUTURE) ×2 IMPLANT
SYR 20CC LL (SYRINGE) ×6 IMPLANT
TOWEL OR 17X24 6PK STRL BLUE (TOWEL DISPOSABLE) ×3 IMPLANT
TOWEL OR 17X26 10 PK STRL BLUE (TOWEL DISPOSABLE) ×3 IMPLANT
WATER STERILE IRR 1000ML POUR (IV SOLUTION) ×2 IMPLANT

## 2016-02-13 NOTE — Anesthesia Postprocedure Evaluation (Signed)
Anesthesia Post Note  Patient: [name]  Procedure(s) Performed: Procedure(s) (LRB): TOTAL KNEE ARTHROPLASTY (Left)  Patient location during evaluation: PACU Anesthesia Type: Spinal Level of consciousness: awake Pain management: satisfactory to patient Vital Signs Assessment: post-procedure vital signs reviewed and stable Respiratory status: spontaneous breathing Cardiovascular status: blood pressure returned to baseline Postop Assessment: no headache and spinal receding Anesthetic complications: no    Last Vitals:  Filed Vitals:   02/13/16 1146 02/13/16 1208  BP: 117/64 115/72  Pulse: 55 60  Temp: 36.7 C 36.3 C  Resp: 13 16    Last Pain:  Filed Vitals:   02/13/16 1210  PainSc: 0-No pain                 Shefali Ng EDWARD

## 2016-02-13 NOTE — Transfer of Care (Signed)
Immediate Anesthesia Transfer of Care Note  Patient: [name]  Procedure(s) Performed: Procedure(s): TOTAL KNEE ARTHROPLASTY (Left)  Patient Location: PACU  Anesthesia Type:Spinal  Level of Consciousness: awake, alert  and oriented  Airway & Oxygen Therapy: Patient Spontanous Breathing and Patient connected to nasal cannula oxygen  Post-op Assessment: Report given to RN and Post -op Vital signs reviewed and stable  Post vital signs: Reviewed and stable  Last Vitals:  Filed Vitals:   02/13/16 0604  BP: 151/60  Pulse: 65  Temp: 36.7 C  Resp: 18    Last Pain:  Filed Vitals:   02/13/16 0636  PainSc: 8          Complications: No apparent anesthesia complications

## 2016-02-13 NOTE — Anesthesia Procedure Notes (Addendum)
Procedure Name: MAC Date/Time: 02/13/2016 7:30 AM Performed by: Mariea Clonts Pre-anesthesia Checklist: Patient identified, Emergency Drugs available, Suction available, Patient being monitored and Timeout performed Patient Re-evaluated:Patient Re-evaluated prior to inductionOxygen Delivery Method: Nasal cannula   Spinal Patient location during procedure: OR Preanesthetic Checklist Completed: patient identified, site marked, surgical consent, pre-op evaluation, timeout performed, IV checked, risks and benefits discussed and monitors and equipment checked Spinal Block Patient position: sitting Prep: DuraPrep Patient monitoring: heart rate, cardiac monitor, continuous pulse ox and blood pressure Approach: midline Location: L3-4 Injection technique: single-shot Needle Needle type: Sprotte  Needle gauge: 24 G Needle length: 9 cm Assessment Sensory level: T4 Additional Notes Spinal Dosage in OR  Bupivicaine ml       1.6 LLD x 3 min

## 2016-02-13 NOTE — H&P (Signed)
[name] MRN:  [id number] DOB/SEX:  1951-01-14/female  CHIEF COMPLAINT:  Painful left Knee  HISTORY: Patient is a 65 y.o. female presented with a history of pain in the left knee. Onset of symptoms was gradual starting a few years ago with gradually worsening course since that time. Patient has been treated conservatively with over-the-counter NSAIDs and activity modification. Patient currently rates pain in the knee at 10 out of 10 with activity. There is pain at night.  PAST MEDICAL HISTORY: Patient Active Problem List   Diagnosis Date Noted  . Globus sensation 05/17/2015  . Productive cough 05/17/2015  . Genetic testing 12/23/2014  . Family history of breast cancer   . Family history of colon cancer   . Family history of pancreatic cancer   . Breast cancer of upper-outer quadrant of right female breast ([place]) 11/23/2014  . Vaginal discharge 10/19/2014  . Trichomonal vaginitis 10/19/2014  . Allergy to ACE inhibitors 09/27/2014  . Encounter for Medicare annual wellness exam 06/27/2014  . Hyperglycemia 06/23/2013  . Routine general medical examination at a health care facility 06/23/2013  . Tongue lesion 02/17/2013  . Dizziness and giddiness 01/16/2013  . Disturbance of skin sensation 01/16/2013  . RLS (restless legs syndrome) 01/15/2013  . Hx of Clostridium difficile infection 10/10/2012  . Blood in stool 07/31/2012  . Bronchiectasis without acute exacerbation ([place]) 04/29/2012  . Dyspnea on exertion 04/02/2012  . Non-ischemic cardiomyopathy ([place]) 03/07/2012  . Colon cancer screening 01/11/2012  . Post-menopausal 01/11/2012  . Chest pain 11/07/2011  . Palpitations 04/17/2011  . Abnormal tympanic membrane 01/22/2011  . Obstructive sleep apnea 04/04/2010  . VENTRICULAR TACHYCARDIA 01/03/2009  . ICD  [employer]  Single chamber 01/03/2009  . PATENT FORAMEN OVALE 09/29/2008  . Vitamin D deficiency 08/17/2008  . Hypothyroidism 11/18/2006  . Hyperlipidemia 11/18/2006   . DEPRESSION 11/18/2006  . Essential hypertension 11/18/2006  . MITRAL VALVE PROLAPSE 11/18/2006  . CEREBRAL ANEURYSM 11/18/2006  . ALLERGIC RHINITIS 11/18/2006  . GERD 11/18/2006  . DIVERTICULOSIS, COLON 11/18/2006  . Fatty liver 11/18/2006  . FIBROMYALGIA 11/18/2006   Past Medical History  Diagnosis Date  . Paroxysmal ventricular tachycardia ([place])   . Ostium secundum type atrial septal defect   . Unspecified transient cerebral ischemia   . Takotsubo syndrome   . Unspecified vitamin D deficiency   . Other pulmonary embolism and infarction   . Other chronic nonalcoholic liver disease   . Cerebral aneurysm, nonruptured   . Hypothyroidism   . Hypertension   . Hyperlipidemia   . Esophageal reflux   . Diverticulosis of colon (without mention of hemorrhage)   . Depressive disorder, not elsewhere classified   . Obstructive sleep apnea (adult) (pediatric)   . GI bleed   . PUD (peptic ulcer disease)   . Clostridium difficile infection   . Anxiety   . Arthritis   . 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 Sarcoid ([place])   . Presence of permanent cardiac pacemaker   . AICD (automatic cardioverter/defibrillator) present   . Stroke [facility]) 2013    tia  . History of hiatal hernia   . GERD (gastroesophageal reflux disease)   . Internal hemorrhoids   . Hiatal hernia   . Radiation 02/03/15-03/10/15    Right Breast  . Pneumonia     history  . Fibromyalgia    Past Surgical History  Procedure Laterality Date  . Partial hysterectomy      Fibroids  . Cerebral aneurysm repair  02/1999  . Knee arthroscopy      bilateral  . Cardiac defibrillator placement  2006; 2012    BSX single chamber ICD  . Carotid dopplers  2006    neg  . Hospitalization  2004    GI bleed, PUD, diverticulosis (EGD,colonscopy)  . Hospitalization      PE, NSVT, s/p defib  . Abi  2006    normal  . Left heart catheterization with coronary angiogram N/A 02/22/2012    Procedure: LEFT HEART CATHETERIZATION WITH  CORONARY ANGIOGRAM;  Surgeon: Burnell Blanks, MD;  Location: [facility] CATH LAB;  Service: Cardiovascular;  Laterality: N/A;  . Abdominal hysterectomy    . Breast lumpectomy with radioactive seed localization Right 01/03/2015    Procedure: BREAST LUMPECTOMY WITH RADIOACTIVE SEED LOCALIZATION;  Surgeon: Autumn Messing III, MD;  Location: [place];  Service: General;  Laterality: Right;     MEDICATIONS:   Prescriptions prior to admission  Medication Sig Dispense Refill Last Dose  . acetaminophen (TYLENOL) 500 MG tablet Take 500 mg by mouth every 6 (six) hours as needed (pain).    Taking  . aspirin 81 MG tablet Take 81 mg by mouth daily.     Taking  . cholecalciferol (VITAMIN D) 1000 UNITS tablet Take 2,000 Units by mouth daily.   Taking  . fexofenadine (ALLEGRA) 180 MG tablet Take 180 mg by mouth daily as needed for allergies or rhinitis.   Taking  . fluticasone (FLONASE) 50 MCG/ACT nasal spray Place 2 sprays into both nostrils daily as needed for allergies or rhinitis. 48 g 3 Taking  . hydrochlorothiazide (HYDRODIURIL) 25 MG tablet TAKE 1 TABLET (25 MG TOTAL) BY MOUTH DAILY. 90 tablet 3 Taking  . latanoprost (XALATAN) 0.005 % ophthalmic solution Place 1 drop into both eyes at bedtime.   Taking  . levothyroxine (SYNTHROID) 50 MCG tablet Take 1 tablet (50 mcg total) by mouth daily. 90 tablet 3 Taking  . metoprolol succinate (TOPROL-XL) 50 MG 24 hr tablet Take 1 tablet (50 mg total) by mouth daily. Take with or immediately following a meal. 90 tablet 3 Taking  . Multiple Vitamins-Minerals (MULTIVITAMIN PO) Take 1 tablet by mouth daily.   Taking  . pantoprazole (PROTONIX) 40 MG tablet Take 1 tablet (40 mg total) by mouth 2 (two) times daily before a meal. 180 tablet 3 Taking  . polyethylene glycol powder (GLYCOLAX/MIRALAX) powder Take 17 grams by mouth daily 255 g 3 Taking  . potassium chloride (K-DUR,KLOR-CON) 10 MEQ tablet TAKE 1/2 TABLET (5MEQ TOTAL) EVERY DAY 45 tablet 3 Taking  . simvastatin (ZOCOR) 40  MG tablet Take 0.5 tablets (20 mg total) by mouth daily. 45 tablet 3 Taking  . traMADol (ULTRAM) 50 MG tablet Take 50 mg by mouth every 8 (eight) hours as needed. (PAIN)   Taking  . zolpidem (AMBIEN) 10 MG tablet Take 1 tablet (10 mg total) by mouth at bedtime as needed. for sleep 90 tablet 1 Taking    ALLERGIES:   Allergies  Allergen Reactions  . Ace Inhibitors Swelling    Angioedema; makes tongue "break out"  . Atorvastatin Other (See Comments)    SEVERE MYALGIAS, MUSCLE PAIN  . Ramipril Other (See Comments)    TONGUE ULCERS  . Rosuvastatin Other (See Comments)    MYALGIAS, SEVERE MUSCLE PAIN  . Amitriptyline Hcl Other (See Comments)    Unknown  . Paroxetine Other (See Comments)    UNKNOWN  . Carbamazepine Rash    REACTION: ? reaction  . Cetirizine  Hcl Other (See Comments)    REACTION: sleepy  . Codeine Itching  . Dilantin [Phenytoin Sodium Extended] Rash    REVIEW OF SYSTEMS:  A comprehensive review of systems was negative except for: Musculoskeletal: positive for arthralgias and stiff joints   FAMILY HISTORY:   Family History  Problem Relation Age of Onset  . Diabetes Mother   . Alzheimer's disease Mother   . Cardiomyopathy      Family history  . Other Brother     Thyroid problem  . Allergies Sister   . Heart disease Sister   . Heart disease Brother   . Prostate cancer Brother 42    same brother as throat cancer  . Throat cancer Brother     dx in his 6s; also a smoker  . Heart disease Father   . Cancer Maternal Grandmother 79    colon cancer or abdominal cancer  . Lung cancer Maternal Grandfather 64  . Breast cancer Maternal Aunt 27  . Colon cancer Maternal Aunt 29    same sister as breast at 51  . Lung cancer Maternal Uncle   . Breast cancer Maternal Aunt     dx in her 75s  . Pancreatic cancer Cousin 95    maternal first cousin  . Breast cancer Cousin     paternal first cousin twice removed died in her 9s  . Heart attack Sister   . Stroke Neg Hx    . Hypertension Sister   . Hypertension Brother     SOCIAL HISTORY:   Social History  Substance Use Topics  . Smoking status: Former Smoker -- 1.00 packs/day for 15 years    Types: Cigarettes    Quit date: 08/20/1989  . Smokeless tobacco: Never Used     Comment: Started at 4; less than 1 PPD  . Alcohol Use: 0.0 oz/week    0 Standard drinks or equivalent per week     Comment: rare     EXAMINATION:  Vital signs in last 24 hours: Temp:  [98.1 F (36.7 C)] 98.1 F (36.7 C) (06/26 0604) Pulse Rate:  [65] 65 (06/26 0604) Resp:  [18] 18 (06/26 0604) BP: (151)/(60) 151/60 mmHg (06/26 0604) SpO2:  [100 %] 100 % (06/26 0604) Weight:  [90.294 kg (199 lb 1 oz)] 90.294 kg (199 lb 1 oz) (06/26 0604)  BP 151/60 mmHg  Pulse 65  Temp(Src) 98.1 F (36.7 C) (Oral)  Resp 18  Ht 5' 6.5" (1.689 m)  Wt 90.294 kg (199 lb 1 oz)  BMI 31.65 kg/m2  SpO2 100%  General Appearance:    Alert, cooperative, no distress, appears stated age  Head:    Normocephalic, without obvious abnormality, atraumatic  Eyes:    PERRL, conjunctiva/corneas clear, EOM's intact, fundi    benign, both eyes  Ears:    Normal TM's and external ear canals, both ears  Nose:   Nares normal, septum midline, mucosa normal, no drainage    or sinus tenderness  Throat:   Lips, mucosa, and tongue normal; teeth and gums normal  Neck:   Supple, symmetrical, trachea midline, no adenopathy;    thyroid:  no enlargement/tenderness/nodules; no carotid   bruit or JVD  Back:     Symmetric, no curvature, ROM normal, no CVA tenderness  Lungs:     Clear to auscultation bilaterally, respirations unlabored  Chest Wall:    No tenderness or deformity   Heart:    Regular rate and rhythm, S1 and S2 normal, no murmur,  rub   or gallop  Breast Exam:    No tenderness, masses, or nipple abnormality  Abdomen:     Soft, non-tender, bowel sounds active all four quadrants,    no masses, no organomegaly  Genitalia:    Normal female without lesion,  discharge or tenderness  Rectal:    Normal tone, no masses or tenderness;   guaiac negative stool  Extremities:   Extremities normal, atraumatic, no cyanosis or edema  Pulses:   2+ and symmetric all extremities  Skin:   Skin color, texture, turgor normal, no rashes or lesions  Lymph nodes:   Cervical, supraclavicular, and axillary nodes normal  Neurologic:   CNII-XII intact, normal strength, sensation and reflexes    throughout    Musculoskeletal:  ROM 0-120, Ligaments intact,  Imaging Review Plain radiographs demonstrate severe degenerative joint disease of the left knee. The overall alignment is neutral. The bone quality appears to be excellent for age and reported activity level.  Assessment/Plan: Primary osteoarthritis, left knee   The patient history, physical examination and imaging studies are consistent with advanced degenerative joint disease of the left knee. The patient has failed conservative treatment.  The clearance notes were reviewed.  After discussion with the patient it was felt that Total Knee Replacement was indicated. The procedure,  risks, and benefits of total knee arthroplasty were presented and reviewed. The risks including but not limited to aseptic loosening, infection, blood clots, vascular injury, stiffness, patella tracking problems complications among others were discussed. The patient acknowledged the explanation, agreed to proceed with the plan.  Donia Ast 02/13/2016, 6:26 AM

## 2016-02-13 NOTE — Progress Notes (Signed)
Orthopedic Tech Progress Note Patient Details:  [name] 1951-02-04 [id number]  CPM Left Knee CPM Left Knee: On Left Knee Flexion (Degrees): 90 Left Knee Extension (Degrees): 0 Additional Comments: Trapeze bar foot roll CPM Left Shoulder CPM Left Shoulder: On Left Shoulder Flexion (Degrees): 90 Left Shoulder Extension (Degrees): 0 Additional Comments: Trapeze bar and foot roll   [name] 02/13/2016, 10:24 AM

## 2016-02-14 ENCOUNTER — Encounter (HOSPITAL_COMMUNITY): Payer: Self-pay | Admitting: Orthopedic Surgery

## 2016-02-14 LAB — CBC
HEMATOCRIT: 34.9 % — AB (ref 36.0–46.0)
HEMOGLOBIN: 11.1 g/dL — AB (ref 12.0–15.0)
MCH: 27.4 pg (ref 26.0–34.0)
MCHC: 31.8 g/dL (ref 30.0–36.0)
MCV: 86.2 fL (ref 78.0–100.0)
Platelets: 166 10*3/uL (ref 150–400)
RBC: 4.05 MIL/uL (ref 3.87–5.11)
RDW: 12.5 % (ref 11.5–15.5)
WBC: 6.4 10*3/uL (ref 4.0–10.5)

## 2016-02-14 LAB — BASIC METABOLIC PANEL
ANION GAP: 10 (ref 5–15)
BUN: 5 mg/dL — ABNORMAL LOW (ref 6–20)
CO2: 30 mmol/L (ref 22–32)
Calcium: 9.8 mg/dL (ref 8.9–10.3)
Chloride: 98 mmol/L — ABNORMAL LOW (ref 101–111)
Creatinine, Ser: 0.8 mg/dL (ref 0.44–1.00)
GFR calc Af Amer: >60 mL/min (ref >60)
GLUCOSE: 151 mg/dL — AB (ref 65–99)
POTASSIUM: 3.1 mmol/L — AB (ref 3.5–5.1)
Sodium: 138 mmol/L (ref 135–145)

## 2016-02-14 MED ORDER — OXYCODONE HCL 5 MG PO TABS: 5.0000 mg | ORAL_TABLET | ORAL | Status: DC | PRN

## 2016-02-14 MED ORDER — ONDANSETRON HCL 4 MG PO TABS: 4.0000 mg | ORAL_TABLET | Freq: Four times a day (QID) | ORAL | Status: DC | PRN

## 2016-02-14 MED ORDER — ASPIRIN 325 MG PO TBEC: 325.0000 mg | DELAYED_RELEASE_TABLET | Freq: Two times a day (BID) | ORAL | Status: DC

## 2016-02-14 MED ORDER — CARISOPRODOL 350 MG PO TABS: 350.0000 mg | ORAL_TABLET | Freq: Four times a day (QID) | ORAL | Status: DC

## 2016-02-14 NOTE — Discharge Summary (Signed)
SPORTS MEDICINE & JOINT REPLACEMENT   Lara Mulch, MD   Carlyon Shadow, PA-C [place], [place], [place]  [address]                             [phone]  PATIENT ID: [name]        MRN:  [id number]          DOB/AGE: [birth year] / 67 y.o.    DISCHARGE SUMMARY  ADMISSION DATE:    02/13/2016 DISCHARGE DATE:   02/14/2016   ADMISSION DIAGNOSIS: primary osteoarthritis left knee    DISCHARGE DIAGNOSIS:  primary osteoarthritis left knee    ADDITIONAL DIAGNOSIS: Active Problems:   S/P total knee replacement  Past Medical History  Diagnosis Date  . Paroxysmal ventricular tachycardia ([place])   . Ostium secundum type atrial septal defect   . Unspecified transient cerebral ischemia   . Takotsubo syndrome   . Unspecified vitamin D deficiency   . Other pulmonary embolism and infarction   . Other chronic nonalcoholic liver disease   . Cerebral aneurysm, nonruptured   . Hypothyroidism   . Hypertension   . Hyperlipidemia   . Esophageal reflux   . Diverticulosis of colon (without mention of hemorrhage)   . Depressive disorder, not elsewhere classified   . Obstructive sleep apnea (adult) (pediatric)   . GI bleed   . PUD (peptic ulcer disease)   . Clostridium difficile infection   . Anxiety   . Arthritis   . 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 Sarcoid ([place])   . Presence of permanent cardiac pacemaker   . AICD (automatic cardioverter/defibrillator) present   . Stroke [facility]) 2013    tia  . History of hiatal hernia   . GERD (gastroesophageal reflux disease)   . Internal hemorrhoids   . Hiatal hernia   . Radiation 02/03/15-03/10/15    Right Breast  . Pneumonia     history  . Fibromyalgia     PROCEDURE: Procedure(s): TOTAL KNEE ARTHROPLASTY on 02/13/2016  CONSULTS:     HISTORY:  See H&P in chart  HOSPITAL COURSE:  [name] is a 65 y.o. admitted on 02/13/2016 and found to have a diagnosis of primary osteoarthritis left knee.  After appropriate  laboratory studies were obtained  they were taken to the operating room on 02/13/2016 and underwent Procedure(s): TOTAL KNEE ARTHROPLASTY.   They were given perioperative antibiotics:  Anti-infectives    Start     Dose/Rate Route Frequency Ordered Stop   02/13/16 1230  ceFAZolin (ANCEF) IVPB 1 g/50 mL premix     1 g 100 mL/hr over 30 Minutes Intravenous Every 6 hours 02/13/16 1210 02/13/16 1934   02/13/16 0000  ceFAZolin (ANCEF) IVPB 2g/100 mL premix     2 g 200 mL/hr over 30 Minutes Intravenous To ShortStay Surgical 02/10/16 1126 02/13/16 0731    .  Patient given tranexamic acid IV or topical and exparel intra-operatively.  Tolerated the procedure well.    POD# 1: Vital signs were stable.  Patient denied Chest pain, shortness of breath, or calf pain.  Patient was started on Lovenox 30 mg subcutaneously twice daily at 8am.  Consults to PT, OT, and care management were made.  The patient was weight bearing as tolerated.  CPM was placed on the operative leg 0-90 degrees for 6-8 hours a day. When out of the CPM, patient was placed in the foam  block to achieve full extension. Incentive spirometry was taught.  Dressing was changed.       POD #2, Continued  PT for ambulation and exercise program.  IV saline locked.  O2 discontinued.    The remainder of the hospital course was dedicated to ambulation and strengthening.   The patient was discharged on 1 Day Post-Op in  Good condition.  Blood products given:none  DIAGNOSTIC STUDIES: Recent vital signs: Patient Vitals for the past 24 hrs:  BP Temp Temp src Pulse Resp SpO2  02/14/16 1500 125/62 mmHg 98.2 F (36.8 C) Oral 74 16 96 %  02/14/16 0430 135/65 mmHg 98.7 F (37.1 C) Oral 80 16 100 %  02/14/16 0030 (!) 146/66 mmHg 98.4 F (36.9 C) Oral 96 16 100 %  02/13/16 2018 (!) 149/53 mmHg 97.7 F (36.5 C) Oral 73 16 99 %       Recent laboratory studies:  Recent Labs  02/14/16 0618  WBC 6.4  HGB 11.1*  HCT 34.9*  PLT 166     Recent Labs  02/14/16 0618  NA 138  K 3.1*  CL 98*  CO2 30  BUN 5*  CREATININE 0.80  GLUCOSE 151*  CALCIUM 9.8   Lab Results  Component Value Date   INR 0.99 02/03/2016   INR 0.9 02/19/2012   INR 0.9 ratio 12/20/2009     Recent Radiographic Studies :  Dg Chest 2 View  02/03/2016  CLINICAL DATA:  Preoperative exam for knee surgery, history of atrial fibrillation, AICD placement, GERD former smoker, Takotsubo syndrome, stroke, sarcoidosis, breast cancer EXAM: CHEST  2 VIEW COMPARISON:  11/10/2015 ; correlation CTA chest 10/26/2015 FINDINGS: LEFT subclavian AICD lead projects over at RIGHT ventricle. Borderline enlargement of cardiac silhouette. Mediastinal contours and pulmonary vascularity normal. RIGHT upper lobe scarring question related to sarcoidosis. No definite acute infiltrate, pleural effusion or pneumothorax. Degenerative disease changes thoracic spine. IMPRESSION: Borderline enlargement of cardiac silhouette. RIGHT upper lobe scarring without acute infiltrate. Electronically Signed   By: Lavonia [name] M.D.   On: 02/03/2016 15:24    DISCHARGE INSTRUCTIONS:   DISCHARGE MEDICATIONS:     Medication List    STOP taking these medications        aspirin 81 MG tablet  Replaced by:  aspirin 325 MG EC tablet      TAKE these medications        acetaminophen 500 MG tablet  Commonly known as:  TYLENOL  Take 500 mg by mouth every 6 (six) hours as needed (pain).     aspirin 325 MG EC tablet  Take 1 tablet (325 mg total) by mouth 2 (two) times daily.     carisoprodol 350 MG tablet  Commonly known as:  SOMA  Take 1 tablet (350 mg total) by mouth 4 (four) times daily.     cholecalciferol 1000 units tablet  Commonly known as:  VITAMIN D  Take 2,000 Units by mouth daily.     fexofenadine 180 MG tablet  Commonly known as:  ALLEGRA  Take 180 mg by mouth daily as needed for allergies or rhinitis.     fluticasone 50 MCG/ACT nasal spray  Commonly known as:  FLONASE   Place 2 sprays into both nostrils daily as needed for allergies or rhinitis.     hydrochlorothiazide 25 MG tablet  Commonly known as:  HYDRODIURIL  TAKE 1 TABLET (25 MG TOTAL) BY MOUTH DAILY.     latanoprost 0.005 % ophthalmic solution  Commonly known as:  Ivin Poot  Place 1 drop into both eyes at bedtime.     levothyroxine 50 MCG tablet  Commonly known as:  SYNTHROID  Take 1 tablet (50 mcg total) by mouth daily.     metoprolol succinate 50 MG 24 hr tablet  Commonly known as:  TOPROL-XL  Take 1 tablet (50 mg total) by mouth daily. Take with or immediately following a meal.     MULTIVITAMIN PO  Take 1 tablet by mouth daily.     ondansetron 4 MG tablet  Commonly known as:  ZOFRAN  Take 1 tablet (4 mg total) by mouth every 6 (six) hours as needed for nausea.     oxyCODONE 5 MG immediate release tablet  Commonly known as:  Oxy IR/ROXICODONE  Take 1-2 tablets (5-10 mg total) by mouth every 4 (four) hours as needed for breakthrough pain.     pantoprazole 40 MG tablet  Commonly known as:  PROTONIX  Take 1 tablet (40 mg total) by mouth 2 (two) times daily before a meal.     polyethylene glycol powder powder  Commonly known as:  GLYCOLAX/MIRALAX  Take 17 grams by mouth daily     potassium chloride 10 MEQ tablet  Commonly known as:  K-DUR,KLOR-CON  TAKE 1/2 TABLET (5MEQ TOTAL) EVERY DAY     simvastatin 40 MG tablet  Commonly known as:  ZOCOR  Take 0.5 tablets (20 mg total) by mouth daily.     traMADol 50 MG tablet  Commonly known as:  ULTRAM  Take 50 mg by mouth every 8 (eight) hours as needed. (PAIN)     zolpidem 10 MG tablet  Commonly known as:  AMBIEN  Take 1 tablet (10 mg total) by mouth at bedtime as needed. for sleep        FOLLOW UP VISIT:    DISPOSITION: HOME VS. SNF  CONDITION:  Good   Donia Ast 02/14/2016, 3:11 PM

## 2016-02-14 NOTE — Progress Notes (Signed)
Physical Therapy Treatment Patient Details Name: [name] MRN: [id number] DOB: 04-May-1951 Today's Date: 02/14/2016    History of Present Illness Pt is a 65 y.o. female now s/p Lt TKA. PMH: depressive disorder, anxiety, pacemaker, defibrillator, CVA, fibromyalgia.     PT Comments    Pt performed increased mobility completing stair training, advancing gait distance and reviewing completed HEP.  Pt to d/c this pm.    Follow Up Recommendations  Home health PT;Supervision for mobility/OOB     Equipment Recommendations  None recommended by PT (Reports having RW and BSC at home.  )    Recommendations for Other Services       Precautions / Restrictions Precautions Precautions: Knee;Fall Precaution Booklet Issued: Yes (comment) Precaution Comments: pacemaker, defibrillator, HEP provided, reviewed knee extension precautions.  Required Braces or Orthoses: Knee Immobilizer - Left Knee Immobilizer - Left: On when out of bed or walking Restrictions Weight Bearing Restrictions: Yes LLE Weight Bearing: Weight bearing as tolerated    Mobility  Bed Mobility Overal bed mobility: Needs Assistance Bed Mobility: Sit to Supine;Supine to Sit     Supine to sit: Supervision Sit to supine: Supervision   General bed mobility comments: Slow guarded movement, self assisting LLE.    Transfers Overall transfer level: Needs assistance Equipment used: Rolling walker (2 wheeled) Transfers: Sit to/from Stand Sit to Stand: Supervision         General transfer comment: Cues for hand placement to and from seated surface.    Ambulation/Gait Ambulation/Gait assistance: Min guard Ambulation Distance (Feet): 200 Feet Assistive device: Rolling walker (2 wheeled) Gait Pattern/deviations: Step-to pattern;Decreased step length - left;Decreased stance time - right;Antalgic;Decreased stride length Gait velocity: decreased   General Gait Details: Pt performed gait without Knee Immobilizer and  required cues for L heel strike and Knee extension in stance phase.  Pt performed with flexed posture and required cues to correct.     Stairs            Wheelchair Mobility    Modified Rankin (Stroke Patients Only)       Balance Overall balance assessment: Needs assistance   Sitting balance-Leahy Scale: Good       Standing balance-Leahy Scale: Poor                      Cognition Arousal/Alertness: Awake/alert Behavior During Therapy: WFL for tasks assessed/performed Overall Cognitive Status: Within Functional Limits for tasks assessed                      Exercises Total Joint Exercises Ankle Circles/Pumps: AROM;Both;15 reps;Supine Quad Sets: AROM;Left;10 reps;Supine Towel Squeeze: AROM;Left;10 reps;Supine Short Arc Quad: AAROM;Left;10 reps;Supine Heel Slides: AAROM;Left;10 reps;Supine Hip ABduction/ADduction: AAROM;Left;10 reps;Supine Straight Leg Raises: AAROM;Left;10 reps;Supine Long Arc Quad: Left;10 reps;AAROM;Seated Knee Flexion: AROM;AAROM;Left;10 reps (1x5 AROM and 1x5 AAROM) Goniometric ROM: 6-73 degree R knee    General Comments        Pertinent Vitals/Pain Pain Assessment: 0-10 Pain Score: 9  Faces Pain Scale: Hurts even more Pain Location: L knee Pain Descriptors / Indicators: Guarding;Grimacing;Operative site guarding Pain Intervention(s): Monitored during session;Repositioned;Ice applied    Home Living Family/patient expects to be discharged to:: Private residence Living Arrangements: Alone Available Help at Discharge: Family;Available 24 hours/day Type of Home: House Home Access: Stairs to enter   Home Layout: One level Home Equipment: Environmental consultant - 4 wheels;Walker - 2 wheels;Bedside commode;Adaptive equipment Additional Comments: granddaughter will be staying with patient at D/C.  Prior Function Level of Independence: Independent      Comments: using rollator at times.    PT Goals (current goals can now be found  in the care plan section) Acute Rehab PT Goals Patient Stated Goal: get back to being independent and living alone.  Potential to Achieve Goals: Good Progress towards PT goals: Progressing toward goals    Frequency  7X/week    PT Plan Current plan remains appropriate    Co-evaluation             End of Session Equipment Utilized During Treatment: Gait belt Activity Tolerance: Patient tolerated treatment well Patient left: in chair;with call bell/phone within reach;with family/visitor present     Time: 1359-1435 PT Time Calculation (min) (ACUTE ONLY): 36 min  Charges:  $Gait Training: 8-22 mins $Therapeutic Exercise: 8-22 mins                    G Codes:      Cristela Blue 2016-02-15, 2:46 PM Governor Rooks, PTA pager [phone]

## 2016-02-14 NOTE — Evaluation (Signed)
Occupational Therapy Evaluation and Discharge Patient Details Name: [name] MRN: [id number] DOB: 1950-10-24 Today's Date: 02/14/2016    History of Present Illness Pt is a 65 y.o. female now s/p Lt TKA. PMH: depressive disorder, anxiety, pacemaker, defibrillator, CVA, fibromyalgia.    Clinical Impression   Pt was independent in ADL and IADL prior to admission. She occasionally used a walker when her knee pain was especially bad. Pt presents with post operative pain and impaired standing balance interfering with ability to function at her baseline. Educate pt in use of AE and 3 in 1 for ADL. Instructed in transporting items safely with RW, safe footwear and technique for shower transfer. Pt verbalizing understanding of all. Will have assist of her granddaughter upon return home. No further OT needs.    Follow Up Recommendations  No OT follow up    Equipment Recommendations  None recommended by OT    Recommendations for Other Services       Precautions / Restrictions Precautions Precautions: Knee;Fall Precaution Booklet Issued: Yes (comment) Precaution Comments: pacemaker, defibrillator, HEP provided, reviewed knee extension precautions.  Required Braces or Orthoses: Knee Immobilizer - Left Knee Immobilizer - Left: On when out of bed or walking Restrictions Weight Bearing Restrictions: Yes LLE Weight Bearing: Weight bearing as tolerated      Mobility Bed Mobility Overal bed mobility: Needs Assistance Bed Mobility: Sit to Supine;Supine to Sit     Supine to sit: Supervision Sit to supine: Supervision   General bed mobility comments: Slow guarded movement, self assisting LLE.    Transfers Overall transfer level: Needs assistance Equipment used: Rolling walker (2 wheeled) Transfers: Sit to/from Stand Sit to Stand: Min guard         General transfer comment: Cues for hand placement to and from seated surface.      Balance Overall balance assessment: Needs  assistance   Sitting balance-Leahy Scale: Good       Standing balance-Leahy Scale: Poor                              ADL Overall ADL's : Needs assistance/impaired Eating/Feeding: Independent;Sitting   Grooming: Wash/dry hands;Standing;Min guard   Upper Body Bathing: Set up;Sitting   Lower Body Bathing: Minimal assistance;Sit to/from stand Lower Body Bathing Details (indicate cue type and reason): educated in use of long handled bath sponge Upper Body Dressing : Set up;Sitting   Lower Body Dressing: Minimal assistance;Sit to/from stand Lower Body Dressing Details (indicate cue type and reason): educated in use of AE Toilet Transfer: Min guard;Ambulation;RW;BSC   Toileting- Water quality scientist and Hygiene: Min guard;Sit to/from Nurse, children's Details (indicate cue type and reason): verbally instructed in technique and use of 3 in 1 as shower seat Functional mobility during ADLs: Min guard;Rolling walker General ADL Comments: Instructed in transporting items safely with RW and safe footwear.     Vision     Perception     Praxis      Pertinent Vitals/Pain Pain Assessment: Faces Pain Score: 5  Faces Pain Scale: Hurts even more Pain Location: L knee Pain Descriptors / Indicators: Operative site guarding;Sore;Grimacing Pain Intervention(s): Limited activity within patient's tolerance;Monitored during session;Premedicated before session;Repositioned;Ice applied     Hand Dominance Right   Extremity/Trunk Assessment Upper Extremity Assessment Upper Extremity Assessment: Overall WFL for tasks assessed   Lower Extremity Assessment Lower Extremity Assessment: Defer to PT evaluation  Communication Communication Communication: No difficulties   Cognition Arousal/Alertness: Awake/alert Behavior During Therapy: WFL for tasks assessed/performed Overall Cognitive Status: Within Functional Limits for tasks assessed                      General Comments       Exercises       Shoulder Instructions      Home Living Family/patient expects to be discharged to:: Private residence Living Arrangements: Alone Available Help at Discharge: Family;Available 24 hours/day Type of Home: House Home Access: Stairs to enter [employer] of Steps: 7   Home Layout: One level     Bathroom Shower/Tub: Occupational psychologist: Standard     Home Equipment: Environmental consultant - 4 wheels;Walker - 2 wheels;Bedside commode;Adaptive equipment Adaptive Equipment: Reacher (back brush on handle) Additional Comments: granddaughter will be staying with patient at D/C.       Prior Functioning/Environment Level of Independence: Independent        Comments: using rollator at times.     OT Diagnosis: Generalized weakness;Acute pain   OT Problem List:     OT Treatment/Interventions:      OT Goals(Current goals can be found in the care plan section) Acute Rehab OT Goals Patient Stated Goal: get back to being independent and living alone.   OT Frequency: Min 2X/week   Barriers to D/C:            Co-evaluation              End of Session   Activity Tolerance: Patient tolerated treatment well Patient left: in bed;in CPM   Time: [id number] OT Time Calculation (min): 29 min Charges:  OT General Charges $OT Visit: 1 Procedure OT Evaluation $OT Eval Low Complexity: 1 Procedure OT Treatments $Self Care/Home Management : 8-22 mins G-Codes:    Malka So 02/14/2016, 11:31 AM  [phone]

## 2016-02-14 NOTE — Progress Notes (Signed)
Orthopedic Tech Progress Note Patient Details:  [name] [birth year] [id number] Ortho visit put on cpm at 1545 Patient ID: [name], female   DOB: 23-Oct-1950, 65 y.o.   MRN: [id number]   [name] 02/14/2016, 3:47 PM

## 2016-02-14 NOTE — Care Management Important Message (Signed)
Important Message  Patient Details  Name: [name] MRN: [id number] Date of Birth: [birth year]   Medicare Important Message Given:  Yes    Loann Quill 02/14/2016, 8:13 AM

## 2016-02-14 NOTE — Progress Notes (Signed)
Physical Therapy Treatment Patient Details Name: [name] MRN: [id number] DOB: Jun 21, 1951 Today's Date: 02/14/2016    History of Present Illness Pt is a 65 y.o. female now s/p Lt TKA. PMH: depressive disorder, anxiety, pacemaker, defibrillator, CVA, fibromyalgia.     PT Comments    Pt performed increased gait distance and progressed through supine exercises this am.  Will f/u this pm to review complete HEP, measure L knee ROM and complete stair training.    Follow Up Recommendations  Home health PT;Supervision for mobility/OOB     Equipment Recommendations  None recommended by PT (Reports having RW and BSC at home.  )    Recommendations for Other Services       Precautions / Restrictions Precautions Precautions: Knee;Fall Precaution Booklet Issued: Yes (comment) Precaution Comments: pacemaker, defibrillator, HEP provided, reviewed knee extension precautions.  Required Braces or Orthoses: Knee Immobilizer - Left Knee Immobilizer - Left: On when out of bed or walking Restrictions Weight Bearing Restrictions: Yes LLE Weight Bearing: Weight bearing as tolerated    Mobility  Bed Mobility Overal bed mobility: Needs Assistance Bed Mobility: Sit to Supine       Sit to supine: Supervision   General bed mobility comments: Slow guarded movement, self assisting LLE.    Transfers Overall transfer level: Needs assistance Equipment used: Rolling walker (2 wheeled) Transfers: Sit to/from Stand Sit to Stand: Min guard         General transfer comment: Cues for hand placement to and from seated surface.    Ambulation/Gait Ambulation/Gait assistance: Min guard Ambulation Distance (Feet): 96 Feet Assistive device: Rolling walker (2 wheeled) Gait Pattern/deviations: Step-to pattern;Decreased stride length;Decreased weight shift to left;Shuffle;Decreased dorsiflexion - left;Trunk flexed Gait velocity: decreased   General Gait Details: Pt performed gait without Knee  Immobilizer and required cues for L heel strike and Knee extension in stance phase.  Pt performed with flexed posture and required cues to correct.     Stairs            Wheelchair Mobility    Modified Rankin (Stroke Patients Only)       Balance Overall balance assessment: Needs assistance   Sitting balance-Leahy Scale: Good       Standing balance-Leahy Scale: Poor                      Cognition Arousal/Alertness: Awake/alert Behavior During Therapy: WFL for tasks assessed/performed Overall Cognitive Status: Within Functional Limits for tasks assessed                      Exercises Total Joint Exercises Ankle Circles/Pumps: AROM;Both;15 reps;Supine Quad Sets: AROM;Left;10 reps;Supine Towel Squeeze: AROM;Left;10 reps;Supine Short Arc Quad: AAROM;Left;10 reps;Supine Heel Slides: AAROM;Left;10 reps;Supine Hip ABduction/ADduction: AAROM;Left;10 reps;Supine Straight Leg Raises: AAROM;Left;10 reps;Supine    General Comments        Pertinent Vitals/Pain Pain Assessment: 0-10 Pain Score: 5  Pain Location: Left knee posterior aspect.   Pain Descriptors / Indicators: Aching Pain Intervention(s): Monitored during session;Repositioned;Ice applied    Home Living                      Prior Function            PT Goals (current goals can now be found in the care plan section) Acute Rehab PT Goals Patient Stated Goal: get back to being independent and living alone.  Potential to Achieve Goals: Good Progress towards PT goals: Progressing toward  goals    Frequency  7X/week    PT Plan Current plan remains appropriate    Co-evaluation             End of Session Equipment Utilized During Treatment: Gait belt Activity Tolerance: Patient tolerated treatment well Patient left: in chair;with call bell/phone within reach;with family/visitor present     Time: [id number] PT Time Calculation (min) (ACUTE ONLY): 30 min  Charges:   $Gait Training: 8-22 mins $Therapeutic Exercise: 8-22 mins                    G Codes:      Cristela Blue 03/15/2016, 10:26 AM  Governor Rooks, PTA pager [phone]

## 2016-02-14 NOTE — Progress Notes (Signed)
Orthopedic Tech Progress Note Patient Details:  [name] 1951-05-01 [id number]  Patient ID: [name], female   DOB: [birth year], 65 y.o.   MRN: [id number] Applied cpm 0-90  [name] 02/14/2016, 6:19 AM

## 2016-02-14 NOTE — Progress Notes (Signed)
Pt discharged from unit via pvt auto to home with family. All belongings with pt. Pt demonstrates no s/sx of distress.Discharge instructions and rx with pt and reviewed.

## 2016-02-14 NOTE — Progress Notes (Signed)
SPORTS MEDICINE AND JOINT REPLACEMENT  Lara Mulch, MD    Carlyon Shadow, PA-C [address], [place], [place]  [address]                             [phone]   PROGRESS NOTE  Subjective:  negative for Chest Pain  negative for Shortness of Breath  negative for Nausea/Vomiting   negative for Calf Pain  negative for Bowel Movement   Tolerating Diet: yes         Patient reports pain as 5 on 0-10 scale.    Objective: Vital signs in last 24 hours:   Patient Vitals for the past 24 hrs:  BP Temp Temp src Pulse Resp SpO2  02/14/16 0430 135/65 mmHg 98.7 F (37.1 C) Oral 80 16 100 %  02/14/16 0030 (!) 146/66 mmHg 98.4 F (36.9 C) Oral 96 16 100 %  02/13/16 2018 (!) 149/53 mmHg 97.7 F (36.5 C) Oral 73 16 99 %  02/13/16 1208 115/72 mmHg 97.3 F (36.3 C) Oral 60 16 94 %  02/13/16 1146 117/64 mmHg 98.1 F (36.7 C) - (!) 55 13 100 %  02/13/16 1145 - - - (!) 55 14 100 %  02/13/16 1131 (!) 118/53 mmHg - - (!) 52 13 100 %  02/13/16 1130 - - - (!) 53 13 100 %  02/13/16 1116 (!) 122/51 mmHg - - (!) 53 14 100 %  02/13/16 1115 - - - (!) 52 13 100 %  02/13/16 1101 115/62 mmHg - - (!) 52 13 100 %  02/13/16 1100 - - - (!) 54 13 100 %  02/13/16 1046 (!) 121/58 mmHg - - (!) 52 13 100 %  02/13/16 1045 - - - (!) 52 14 100 %  02/13/16 1031 (!) 109/47 mmHg - - (!) 59 14 100 %  02/13/16 1030 - - - 60 13 100 %  02/13/16 1016 (!) 115/54 mmHg - - (!) 56 13 100 %  02/13/16 1015 - - - (!) 58 11 100 %  02/13/16 1001 119/64 mmHg 97.5 F (36.4 C) - (!) 56 (!) 9 100 %  02/13/16 1000 - - - 61 15 100 %    @flow {1959:LAST@   Intake/Output from previous day:   06/26 0701 - 06/27 0700 In: 1480 [P.O.:480; I.V.:1000] Out: 50    Intake/Output this shift:       Intake/Output      06/26 0701 - 06/27 0700 06/27 0701 - 06/28 0700   P.O. 480    I.V. (mL/kg) 1000 (11.1)    Total Intake(mL/kg) 1480 (16.4)    Urine (mL/kg/hr) 0 (0)    Blood 50 (0)    Total Output 50     Net +1430          Urine Occurrence 8 x       LABORATORY DATA: No results for input(s): WBC, HGB, HCT, PLT in the last 168 hours. No results for input(s): NA, K, CL, CO2, BUN, CREATININE, GLUCOSE, CALCIUM in the last 168 hours. Lab Results  Component Value Date   INR 0.99 02/03/2016   INR 0.9 02/19/2012   INR 0.9 ratio 12/20/2009    Examination:  General appearance: alert, cooperative and no distress Extremities: extremities normal, atraumatic, no cyanosis or edema  Wound Exam: clean, dry, intact   Drainage:  None: wound tissue dry  Motor Exam: Quadriceps and Hamstrings Intact  Sensory Exam: Superficial Peroneal, Deep Peroneal  and Tibial normal   Assessment:    1 Day Post-Op  Procedure(s) (LRB): TOTAL KNEE ARTHROPLASTY (Left)  ADDITIONAL DIAGNOSIS:  Active Problems:   S/P total knee replacement  Acute Blood Loss Anemia   Plan: Physical Therapy as ordered Weight Bearing as Tolerated (WBAT)  DVT Prophylaxis:  Aspirin  DISCHARGE PLAN: Home  DISCHARGE NEEDS: HHPT   Patient is doing great. Expected D/C today         Donia Ast 02/14/2016, 7:04 AM

## 2016-02-14 NOTE — Op Note (Signed)
TOTAL KNEE REPLACEMENT OPERATIVE NOTE:  02/13/2016  8:44 AM  PATIENT:  [name]  65 y.o. female  PRE-OPERATIVE DIAGNOSIS:  primary osteoarthritis left knee  POST-OPERATIVE DIAGNOSIS:  primary osteoarthritis left knee  PROCEDURE:  Procedure(s): TOTAL KNEE ARTHROPLASTY  SURGEON:  Surgeon(s): Vickey Huger, MD  PHYSICIAN ASSISTANT: Carlyon Shadow, [facility]   ANESTHESIA:   spinal  DRAINS: Hemovac  SPECIMEN: None  COUNTS:  Correct  TOURNIQUET:   Total Tourniquet Time Documented: Thigh (Left) - 77 minutes Total: Thigh (Left) - 77 minutes   DICTATION:  Indication for procedure:    The patient is a 65 y.o. female who has failed conservative treatment for primary osteoarthritis left knee.  Informed consent was obtained prior to anesthesia. The risks versus benefits of the operation were explain and in a way the patient can, and did, understand.   On the implant demand matching protocol, this patient scored 6.  Therefore, this patient did" "did not receive a polyethylene insert with vitamin E which is a high demand implant.  Description of procedure:     The patient was taken to the operating room and placed under anesthesia.  The patient was positioned in the usual fashion taking care that all body parts were adequately padded and/or protected.  I foley catheter was not placed.  A tourniquet was applied and the leg prepped and draped in the usual sterile fashion.  The extremity was exsanguinated with the esmarch and tourniquet inflated to 350 mmHg.  Pre-operative range of motion was 0-95 degrees flexion.  The knee was in 5 degree of significant varus.  A midline incision approximately 6-7 inches long was made with a #10 blade.  A new blade was used to make a parapatellar arthrotomy going 2-3 cm into the quadriceps tendon, over the patella, and alongside the medial aspect of the patellar tendon.  A synovectomy was then performed with the #10 blade and forceps. I then elevated the  deep MCL off the medial tibial metaphysis subperiosteally around to the semimembranosus attachment.    I everted the patella and used calipers to measure patellar thickness.  I used the reamer to ream down to appropriate thickness to recreate the native thickness.  I then removed excess bone with the rongeur and sagittal saw.  I used the appropriately sized template and drilled the three lug holes.  I then put the trial in place and measured the thickness with the calipers to ensure recreation of the native thickness.  The trial was then removed and the patella subluxed and the knee brought into flexion.  A homan retractor was place to retract and protect the patella and lateral structures.  A Z-retractor was place medially to protect the medial structures.  The extra-medullary alignment system was used to make cut the tibial articular surface perpendicular to the anamotic axis of the tibia and in 3 degrees of posterior slope.  The cut surface and alignment jig was removed.  I then used the intramedullary alignment guide to make a 6 valgus cut on the distal femur.  I then marked out the epicondylar axis on the distal femur.  The posterior condylar axis measured 5 degrees.  I then used the anterior referencing sizer and measured the femur to be a size 7.  The 4-In-1 cutting block was screwed into place in external rotation matching the posterior condylar angle, making our cuts perpendicular to the epicondylar axis.  Anterior, posterior and chamfer cuts were made with the sagittal saw.  The cutting block  and cut pieces were removed.  A lamina spreader was placed in 90 degrees of flexion.  The ACL, PCL, menisci, and posterior condylar osteophytes were removed.  A 12 mm spacer blocked was found to offer good flexion and extension gap balance after moderate in degree releasing.   The scoop retractor was then placed and the femoral finishing block was pinned in place.  The small sagittal saw was used as well as  the lug drill to finish the femur.  The block and cut surfaces were removed and the medullary canal hole filled with autograft bone from the cut pieces.  The tibia was delivered forward in deep flexion and external rotation.  A size D tray was selected and pinned into place centered on the medial 1/3 of the tibial tubercle.  The reamer and keel was used to prepare the tibia through the tray.    I then trialed with the size 7 femur, size D tibia, a 12 mm insert and the 32 patella.  I had excellent flexion/extension gap balance, excellent patella tracking.  Flexion was full and beyond 120 degrees; extension was zero.  These components were chosen and the staff opened them to me on the back table while the knee was lavaged copiously and the cement mixed.  The soft tissue was infiltrated with 60cc of exparel 1.3% through a 21 gauge needle.  I cemented in the components and removed all excess cement.  The polyethylene tibial component was snapped into place and the knee placed in extension while cement was hardening.  The capsule was infilltrated with 30cc of .25% Marcaine with epinephrine.  A hemovac was place in the joint exiting superolaterally.  A pain pump was place superomedially superficial to the arthrotomy.  Once the cement was hard, the tourniquet was let down.  Hemostasis was obtained.  The arthrotomy was closed with figure-8 #1 vicryl sutures.  The deep soft tissues were closed with #0 vicryls and the subcuticular layer closed with a running #2-0 vicryl.  The skin was reapproximated and closed with skin staples.  The wound was dressed with xeroform, 4 x4's, 2 ABD sponges, a single layer of webril and a TED stocking.   The patient was then awakened, extubated, and taken to the recovery room in stable condition.  BLOOD LOSS:  300cc DRAINS: 1 hemovac, 1 pain catheter COMPLICATIONS:  None.  PLAN OF CARE: Admit to inpatient   PATIENT DISPOSITION:  PACU - hemodynamically stable.   Delay start of  Pharmacological VTE agent (>24hrs) due to surgical blood loss or risk of bleeding:  not applicable  Please fax a copy of this op note to my office at [phone] (please only include page 1 and 2 of the Case Information op note)

## 2016-02-14 NOTE — Progress Notes (Signed)
INITIAL EVALUATION  Late entry for evaluation performed on 02/13/16.   Pt is s/p TKA resulting in the deficits listed below (see PT Problem List). Pt will benefit from skilled PT to increase their independence and safety with mobility to allow discharge to the venue listed below.    02/13/16 1540  PT Visit Information  Last PT Received On 02/13/16  Assistance Needed +1  History of Present Illness Pt is a 65 y.o. female now s/p Lt TKA. PMH: depressive disorder, anxiety, pacemaker, defibrillator, CVA, fibromyalgia.   Precautions  Precautions Knee;Fall  Precaution Booklet Issued Yes (comment)  Precaution Comments pacemaker, defibrillator, HEP provided, reviewed knee extension precautions.   Required Braces or Orthoses Knee Immobilizer - Left  Knee Immobilizer - Left On when out of bed or walking  Restrictions  Weight Bearing Restrictions Yes  LLE Weight Bearing WBAT  Home Living  Family/patient expects to be discharged to: Private residence  Living Arrangements Alone  Available Help at Discharge Family;Available 24 hours/day  Type of Home House  Home Access Stairs to enter  Entrance Stairs-Number of Steps 7  Entrance Stairs-Rails (one entrance on Rt other on Lt, both 7 steps)  Home Layout One level;Other (Comment) (2 steps inside)  [place] - 4 wheels;Walker - 2 wheels  Additional Comments granddaughter will be staying with patient at D/C.   Prior Function  Level of Independence Independent  Comments using rollator at times.   Communication  Communication No difficulties  Pain Assessment  Pain Assessment 0-10  Pain Score 9  Pain Location Lt knee, posterior aspect  Pain Descriptors / Indicators Aching  Pain Intervention(s) Limited activity within patient's tolerance;Monitored during session;Ice applied  Cognition  Arousal/Alertness Awake/alert  Behavior During Therapy WFL for tasks assessed/performed  Overall Cognitive Status Within Functional Limits for tasks  assessed  Upper Extremity Assessment  Upper Extremity Assessment Overall WFL for tasks assessed  Lower Extremity Assessment  Lower Extremity Assessment LLE deficits/detail  LLE Deficits / Details able to perform SLR with lag  Bed Mobility  Overal bed mobility Needs Assistance  Bed Mobility Supine to Sit  Supine to sit Supervision  General bed mobility comments HOB elevated and using rail   Transfers  Overall transfer level Needs assistance  Equipment used Rolling walker (2 wheeled)  Transfers Sit to/from Stand  Sit to Stand Min guard  General transfer comment cues for hand placement  Ambulation/Gait  Ambulation/Gait assistance Min guard  Ambulation Distance (Feet) 4 Feet  Assistive device Rolling walker (2 wheeled)  Gait Pattern/deviations Step-to pattern;Decreased step length - left;Decreased weight shift to left  General Gait Details no knee immobilizer present at time of the PT session, limiting ambulation at this time. Contacted Ortho tech regarding. Stable patter with initial steps.   Gait velocity decreased  Balance  Overall balance assessment Needs assistance  Sitting-balance support No upper extremity supported  Sitting balance-Leahy Scale Good  Standing balance support Bilateral upper extremity supported  Standing balance-Leahy Scale Poor  Standing balance comment using rw  General Comments  General comments (skin integrity, edema, etc.) Encouraging knee extension stretch as tolerated. Pt having difficulty due to pain.   Exercises  Exercises Total Joint  Total Joint Exercises  Ankle Circles/Pumps AROM;Both;15 reps  PT - End of Session  Equipment Utilized During Treatment Gait belt  Activity Tolerance Patient tolerated treatment well  Patient left in chair;with call bell/phone within reach;with family/visitor present  Nurse Communication Mobility status;Weight bearing status  CPM Left Knee  Additional Comments CPM  in bed but pt not in unit upon arrival  PT  Assessment  PT Therapy Diagnosis  Difficulty walking  PT Recommendation/Assessment Patient needs continued PT services  PT Problem List Decreased strength;Decreased range of motion;Decreased activity tolerance;Decreased balance;Decreased mobility  PT Plan  PT Frequency (ACUTE ONLY) 7X/week  PT Treatment/Interventions (ACUTE ONLY) DME instruction;Gait training;Stair training;Functional mobility training;Therapeutic activities;Therapeutic exercise;Patient/family education  PT Recommendation  Follow Up Recommendations Home health PT;Supervision for mobility/OOB  PT equipment None recommended by PT (pt reports having rw )  Individuals Consulted  Consulted and Agree with Results and Recommendations Patient  Acute Rehab PT Goals  Patient Stated Goal get back to being independent and living alone.   PT Goal Formulation With patient  Time For Goal Achievement 02/27/16  Potential to Achieve Goals Good  PT Time Calculation  PT Start Time (ACUTE ONLY) 1502  PT Stop Time (ACUTE ONLY) 1536  PT Time Calculation (min) (ACUTE ONLY) 34 min  PT General Charges  $$ ACUTE PT VISIT 1 Procedure  PT Evaluation  $PT Eval Moderate Complexity 1 Procedure  PT Treatments  $Therapeutic Activity 8-22 mins  Cassell Clement, PT, CSCS Pager [phone] Office [phone]

## 2016-02-20 ENCOUNTER — Other Ambulatory Visit: Payer: Medicare Other

## 2016-02-20 ENCOUNTER — Ambulatory Visit: Payer: Medicare Other | Admitting: Hematology

## 2016-02-22 ENCOUNTER — Ambulatory Visit (INDEPENDENT_AMBULATORY_CARE_PROVIDER_SITE_OTHER): Payer: Medicare Other | Admitting: *Deleted

## 2016-02-22 DIAGNOSIS — I472 Ventricular tachycardia: Secondary | ICD-10-CM | POA: Diagnosis not present

## 2016-02-22 DIAGNOSIS — I429 Cardiomyopathy, unspecified: Secondary | ICD-10-CM | POA: Diagnosis not present

## 2016-02-22 DIAGNOSIS — Z95810 Presence of automatic (implantable) cardiac defibrillator: Secondary | ICD-10-CM | POA: Diagnosis not present

## 2016-02-22 DIAGNOSIS — I4729 Other ventricular tachycardia: Secondary | ICD-10-CM

## 2016-02-22 DIAGNOSIS — I428 Other cardiomyopathies: Secondary | ICD-10-CM

## 2016-02-22 LAB — CUP PACEART REMOTE DEVICE CHECK
Battery Remaining Percentage: 93 %
Brady Statistic RV Percent Paced: 0 %
HighPow Impedance: 62 Ohm
Implantable Lead Location: 753860
Implantable Lead Model: 185
Lead Channel Setting Pacing Amplitude: 2.4 V
Lead Channel Setting Pacing Pulse Width: 0.4 ms
MDC IDC LEAD IMPLANT DT: 20050603
MDC IDC LEAD SERIAL: 116340
MDC IDC MSMT BATTERY REMAINING LONGEVITY: 90 mo
MDC IDC MSMT LEADCHNL RV IMPEDANCE VALUE: 619 Ohm
MDC IDC SESS DTM: 20170705071300
MDC IDC SET LEADCHNL RV SENSING SENSITIVITY: 0.4 mV
Pulse Gen Serial Number: 266301

## 2016-02-22 NOTE — Progress Notes (Signed)
Remote ICD transmission.   

## 2016-02-23 ENCOUNTER — Encounter: Payer: Self-pay | Admitting: *Deleted

## 2016-02-27 ENCOUNTER — Telehealth: Payer: Self-pay | Admitting: Internal Medicine

## 2016-02-27 ENCOUNTER — Telehealth: Payer: Self-pay | Admitting: Pulmonary Disease

## 2016-02-27 MED ORDER — POLYETHYLENE GLYCOL 3350 17 GM/SCOOP PO POWD: ORAL | Status: AC

## 2016-02-27 NOTE — Telephone Encounter (Signed)
Rx sent 

## 2016-02-27 NOTE — Telephone Encounter (Signed)
Message not needed.  Pt needed refill but from different doc.

## 2016-02-29 ENCOUNTER — Encounter: Payer: Self-pay | Admitting: Cardiology

## 2016-03-07 ENCOUNTER — Ambulatory Visit: Payer: Medicare Other | Admitting: Internal Medicine

## 2016-03-14 ENCOUNTER — Encounter: Payer: Self-pay | Admitting: Cardiology

## 2016-05-04 LAB — GLUCOSE, POCT (MANUAL RESULT ENTRY): POC Glucose: 90 mg/dl (ref 70–99)

## 2016-05-15 ENCOUNTER — Ambulatory Visit (INDEPENDENT_AMBULATORY_CARE_PROVIDER_SITE_OTHER): Payer: Medicare Other | Admitting: Pulmonary Disease

## 2016-05-15 ENCOUNTER — Encounter: Payer: Self-pay | Admitting: Pulmonary Disease

## 2016-05-15 VITALS — BP 118/72 | HR 63 | Ht 66.0 in | Wt 203.4 lb

## 2016-05-15 DIAGNOSIS — Z23 Encounter for immunization: Secondary | ICD-10-CM

## 2016-05-15 DIAGNOSIS — J479 Bronchiectasis, uncomplicated: Secondary | ICD-10-CM

## 2016-05-15 DIAGNOSIS — G4733 Obstructive sleep apnea (adult) (pediatric): Secondary | ICD-10-CM | POA: Diagnosis not present

## 2016-05-15 NOTE — Patient Instructions (Addendum)
Will arrange for auto CPAP range 5 to 15 cm H2O with heated humidity  Can look up following company web sites for CPAP mask options: [employer], [employer], [employer], [employer]  Follow up in 3 months

## 2016-05-15 NOTE — Progress Notes (Signed)
Current Outpatient Prescriptions on File Prior to Visit  Medication Sig  . acetaminophen (TYLENOL) 500 MG tablet Take 500 mg by mouth every 6 (six) hours as needed (pain).   [name] aspirin EC 325 MG EC tablet Take 1 tablet (325 mg total) by mouth 2 (two) times daily.  . carisoprodol (SOMA) 350 MG tablet Take 1 tablet (350 mg total) by mouth 4 (four) times daily.  . cholecalciferol (VITAMIN D) 1000 UNITS tablet Take 2,000 Units by mouth daily.  . fexofenadine (ALLEGRA) 180 MG tablet Take 180 mg by mouth daily as needed for allergies or rhinitis.  . fluticasone (FLONASE) 50 MCG/ACT nasal spray Place 2 sprays into both nostrils daily as needed for allergies or rhinitis.  . hydrochlorothiazide (HYDRODIURIL) 25 MG tablet TAKE 1 TABLET (25 MG TOTAL) BY MOUTH DAILY.  [name] latanoprost (XALATAN) 0.005 % ophthalmic solution Place 1 drop into both eyes at bedtime.  [name] levothyroxine (SYNTHROID) 50 MCG tablet Take 1 tablet (50 mcg total) by mouth daily.  . metoprolol succinate (TOPROL-XL) 50 MG 24 hr tablet Take 1 tablet (50 mg total) by mouth daily. Take with or immediately following a meal.  . Multiple Vitamins-Minerals (MULTIVITAMIN PO) Take 1 tablet by mouth daily.  . ondansetron (ZOFRAN) 4 MG tablet Take 1 tablet (4 mg total) by mouth every 6 (six) hours as needed for nausea.  [name] oxyCODONE (OXY IR/ROXICODONE) 5 MG immediate release tablet Take 1-2 tablets (5-10 mg total) by mouth every 4 (four) hours as needed for breakthrough pain.  . pantoprazole (PROTONIX) 40 MG tablet Take 1 tablet (40 mg total) by mouth 2 (two) times daily before a meal.  . polyethylene glycol powder (GLYCOLAX/MIRALAX) powder Take 17 grams by mouth daily  . potassium chloride (K-DUR,KLOR-CON) 10 MEQ tablet TAKE 1/2 TABLET (5MEQ TOTAL) EVERY DAY  . simvastatin (ZOCOR) 40 MG tablet Take 0.5 tablets (20 mg total) by mouth daily.  . traMADol (ULTRAM) 50 MG tablet Take 50 mg by mouth every 8 (eight) hours as needed. (PAIN)  . zolpidem (AMBIEN) 10  MG tablet Take 1 tablet (10 mg total) by mouth at bedtime as needed. for sleep  . [DISCONTINUED] potassium chloride (K-DUR) 10 MEQ tablet Take 0.5 tablets (5 mEq total) by mouth daily. 1/2 tab po qd   No current facility-administered medications on file prior to visit.      Chief Complaint  Patient presents with  . Follow-up    Pt states that she is interested is started CPAP again - pt's daughter told her that she is snoring and stopping breathing at night. Denies any breathing issues.  Flu vaccine today     Sleep tests PSG 11/29/08 >> AHI 9 HST 11/10/15 >> AHI 7.1, SaO2 low 75%  Pulmonary tests CT chest 04/10/12 >> b/l lower lobe cylindrical BTX and some in upper lobes PFT 04/17/12 >> FEV1 2.60 (110%), FEV1% 85, TLC 4.05 (77%), DLCO 44% CT angio chest 10/26/15 >> patchy GGO in periphery of lungs b/l, basilar BTX, no PE; no significant change compared to 2013  Cardiac tests Echo 12/07/14 >> EF 50 to [id number], grade 1 diastolic dysfx, PAS 36 mmHg  Past medical history VT s/p AICD, ASD, CVA, Tako tsubo CM, PE 2005, NASH, Hypothyroidism, GERD, Diverticulosis, Depression, C diff, Anxiety, Breast cancer 2016, Nummular eczema  Past surgical history, Family history, Social history, Allergies reviewed.  Vital Signs BP 118/72 (BP Location: Left Arm, Cuff Size: Normal)   Pulse 63   Ht 5\' 6"  (1.676 m)   Wt  203 lb 6.4 oz (92.3 kg)   SpO2 97%   BMI 32.83 kg/m   History of Present Illness [name] is a 65 y.o. female former smoker with bronchiectasis and obstructive sleep apnea.  She never got oral appliance set up.  She is now interested in restarting CPAP.  Her daughter says her sleep has gotten much worse.  She is snoring more and stops breathing.  Her breathing has been doing okay.  She is not having cough, wheeze, or sputum.  Physical Exam  General - No distress ENT - No sinus tenderness, no oral exudate, no LAN Cardiac - s1s2 regular, no murmur Chest - No  wheeze/rales/dullness Back - No focal tenderness Abd - Soft, non-tender Ext - No edema Neuro - Normal strength Skin - No rashes Psych - normal mood, and behavior   Assessment/Plan  Obstructive sleep apnea. - will arrange for auto CPAP  - she will look up CPAP mask options  Bronchiectasis. - defer further testing at this time since she has minimal respiratory symptoms - flu shot today   Patient Instructions  Will arrange for auto CPAP range 5 to 15 cm H2O with heated humidity  Can look up following company web sites for CPAP mask options: [employer], [employer], [employer]  Follow up in 3 months   Chesley Mires, MD [facility] Pager:  [phone] 05/15/2016, 3:49 PM

## 2016-05-17 ENCOUNTER — Telehealth: Payer: Self-pay | Admitting: Pulmonary Disease

## 2016-05-17 DIAGNOSIS — G4733 Obstructive sleep apnea (adult) (pediatric): Secondary | ICD-10-CM

## 2016-05-17 NOTE — Telephone Encounter (Signed)
-----   Message from Joellen Jersey sent at 05/17/2016  3:42 PM EDT ----- Well it seems pt's hst is 5days to late for the 29mos deadline she will have to have a new hst then be set up with cpap start her 30day compliance for medicare to cover cpap pt is aware I will need order for hst thanks libby

## 2016-05-17 NOTE — Telephone Encounter (Signed)
Please inform pt that her insurance requires repeat sleep study prior to getting CPAP set up.  If she is agreeable to this, then please place order for home sleep study.

## 2016-05-18 DIAGNOSIS — R0982 Postnasal drip: Secondary | ICD-10-CM | POA: Insufficient documentation

## 2016-05-18 DIAGNOSIS — J311 Chronic nasopharyngitis: Secondary | ICD-10-CM | POA: Insufficient documentation

## 2016-05-18 NOTE — Telephone Encounter (Signed)
LM x 1 

## 2016-05-21 NOTE — Telephone Encounter (Signed)
Pt aware of need for HST. Order placed. [facility] will call to schedule. Nothing further needed.

## 2016-05-23 ENCOUNTER — Ambulatory Visit (INDEPENDENT_AMBULATORY_CARE_PROVIDER_SITE_OTHER): Payer: Medicare Other | Admitting: *Deleted

## 2016-05-23 DIAGNOSIS — I428 Other cardiomyopathies: Secondary | ICD-10-CM

## 2016-05-23 DIAGNOSIS — Z95810 Presence of automatic (implantable) cardiac defibrillator: Secondary | ICD-10-CM | POA: Diagnosis not present

## 2016-05-23 NOTE — Progress Notes (Signed)
Remote ICD transmission.   

## 2016-05-25 ENCOUNTER — Encounter: Payer: Self-pay | Admitting: Cardiology

## 2016-05-28 ENCOUNTER — Encounter: Payer: Self-pay | Admitting: Podiatry

## 2016-05-28 ENCOUNTER — Ambulatory Visit (INDEPENDENT_AMBULATORY_CARE_PROVIDER_SITE_OTHER): Payer: Medicare Other

## 2016-05-28 ENCOUNTER — Ambulatory Visit (INDEPENDENT_AMBULATORY_CARE_PROVIDER_SITE_OTHER): Payer: Medicare Other | Admitting: Podiatry

## 2016-05-28 DIAGNOSIS — M79671 Pain in right foot: Secondary | ICD-10-CM

## 2016-05-28 DIAGNOSIS — M779 Enthesopathy, unspecified: Secondary | ICD-10-CM

## 2016-05-28 MED ORDER — TRIAMCINOLONE ACETONIDE 10 MG/ML IJ SUSP
10.0000 mg | Freq: Once | INTRAMUSCULAR | Status: AC
Start: 2016-05-28 — End: 2016-05-28
  Administered 2016-05-28: 10 mg

## 2016-05-28 NOTE — Progress Notes (Signed)
   Subjective:    Patient ID: [name], female    DOB: 01/30/1951, 65 y.o.   MRN: [id number]  HPI    Review of Systems  Cardiovascular: Positive for palpitations.  Musculoskeletal: Positive for back pain, gait problem and joint swelling.  Neurological: Positive for numbness.  All other systems reviewed and are negative.      Objective:   Physical Exam        Assessment & Plan:

## 2016-05-29 NOTE — Progress Notes (Signed)
Subjective:     Patient ID: [name], female   DOB: 1950-11-12, 65 y.o.   MRN: [id number]  HPI patient presents stating she's getting a lot of pain in the outside of the right foot and it's gradually making it hard for her to walk   Review of Systems  All other systems reviewed and are negative.      Objective:   Physical Exam  Constitutional: She is oriented to person, place, and time.  Cardiovascular: Intact distal pulses.   Musculoskeletal: Normal range of motion.  Neurological: She is oriented to person, place, and time.  Skin: Skin is warm.  Nursing note and vitals reviewed.  neurovascular status intact muscle strength adequate range of motion within normal limits with pain in the lateral side of the right foot with inflammation fluid around the peroneal insertion with no indications of muscle strength loss. Patient's found have good digital perfusion and is well oriented 3      Assessment:     Tendinitis of the peroneal complex right with inflammation fluid buildup    Plan:     H&P conditions reviewed and careful sheath injection administered 3 mg Kenalog 5 mg Xylocaine and applied fascial brace to lift the lateral side of the foot. Discussed ice therapy anti-inflammatories and supportive shoe gear  X-rays were negative for signs of fracture or other bone pathology

## 2016-06-01 DIAGNOSIS — G4733 Obstructive sleep apnea (adult) (pediatric): Secondary | ICD-10-CM

## 2016-06-01 LAB — CUP PACEART REMOTE DEVICE CHECK
Battery Remaining Longevity: 84 mo
Battery Remaining Percentage: 90 %
Brady Statistic RV Percent Paced: 0 %
HighPow Impedance: 62 Ohm
Implantable Lead Location: 753860
Lead Channel Impedance Value: 632 Ohm
Lead Channel Setting Pacing Amplitude: 2.4 V
Lead Channel Setting Pacing Pulse Width: 0.4 ms
Lead Channel Setting Sensing Sensitivity: 0.4 mV
MDC IDC LEAD IMPLANT DT: 20050603
MDC IDC LEAD MODEL: 185
MDC IDC LEAD SERIAL: 116340
MDC IDC MSMT LEADCHNL RV PACING THRESHOLD AMPLITUDE: 0.8 V
MDC IDC MSMT LEADCHNL RV PACING THRESHOLD PULSEWIDTH: 0.4 ms
MDC IDC SESS DTM: 20171004070200
Pulse Gen Serial Number: 266301

## 2016-06-06 ENCOUNTER — Telehealth: Payer: Self-pay | Admitting: Pulmonary Disease

## 2016-06-06 DIAGNOSIS — G4733 Obstructive sleep apnea (adult) (pediatric): Secondary | ICD-10-CM

## 2016-06-06 NOTE — Telephone Encounter (Signed)
HST 06/01/16 >> AHI 10.7, SaO2 low 82%  Will have my nurse inform pt that sleep study shows mild sleep apnea.  Please send order for auto CPAP range 5 to 15 cm H2O with heated humidity and mask of choice.  Have download sent 1 month after starting CPAP and set up ROV 2 months after starting CPAP.  ROV can be with me or NP.

## 2016-06-08 ENCOUNTER — Encounter: Payer: Self-pay | Admitting: Cardiology

## 2016-06-08 NOTE — Telephone Encounter (Signed)
Results have been explained to patient, pt expressed understanding.  Order placed for CPAP Appt schedule with TP for 08/09/16 at 2pm Nothing further needed.

## 2016-06-11 ENCOUNTER — Encounter: Payer: Self-pay | Admitting: Podiatry

## 2016-06-11 ENCOUNTER — Other Ambulatory Visit: Payer: Self-pay | Admitting: Family Medicine

## 2016-06-11 ENCOUNTER — Ambulatory Visit (INDEPENDENT_AMBULATORY_CARE_PROVIDER_SITE_OTHER): Payer: Medicare Other | Admitting: Podiatry

## 2016-06-11 DIAGNOSIS — M779 Enthesopathy, unspecified: Secondary | ICD-10-CM

## 2016-06-11 DIAGNOSIS — M79671 Pain in right foot: Secondary | ICD-10-CM | POA: Diagnosis not present

## 2016-06-11 DIAGNOSIS — G4733 Obstructive sleep apnea (adult) (pediatric): Secondary | ICD-10-CM

## 2016-06-11 NOTE — Telephone Encounter (Signed)
Px written for call in   

## 2016-06-11 NOTE — Telephone Encounter (Signed)
Rx called in as prescribed 

## 2016-06-11 NOTE — Progress Notes (Signed)
Subjective:     Patient ID: [name], female   DOB: 1951-04-01, 65 y.o.   MRN: [id number]  HPI patient states my foot is feeling quite a bit better   Review of Systems     Objective:   Physical Exam Neurovascular status unchanged no health history changes noted with patient found have diminished inflammation on the lateral side right foot    Assessment:     Improve tendinitis right    Plan:     Advised on heat ice therapy anti-inflammatories and supportive shoes and reappoint to recheck

## 2016-06-11 NOTE — Telephone Encounter (Signed)
Received refill request electronically Last refill 11/22/15 #90/1 Last office visit same date

## 2016-06-12 DIAGNOSIS — M26629 Arthralgia of temporomandibular joint, unspecified side: Secondary | ICD-10-CM | POA: Insufficient documentation

## 2016-06-13 ENCOUNTER — Other Ambulatory Visit: Payer: Self-pay | Admitting: *Deleted

## 2016-06-13 DIAGNOSIS — G4733 Obstructive sleep apnea (adult) (pediatric): Secondary | ICD-10-CM

## 2016-06-14 ENCOUNTER — Ambulatory Visit (INDEPENDENT_AMBULATORY_CARE_PROVIDER_SITE_OTHER): Payer: Medicare Other | Admitting: *Deleted

## 2016-06-14 ENCOUNTER — Encounter: Payer: Self-pay | Admitting: Cardiology

## 2016-06-14 ENCOUNTER — Ambulatory Visit (INDEPENDENT_AMBULATORY_CARE_PROVIDER_SITE_OTHER): Payer: Medicare Other | Admitting: Cardiology

## 2016-06-14 VITALS — BP 126/74 | HR 69 | Ht 66.0 in | Wt 203.8 lb

## 2016-06-14 DIAGNOSIS — R002 Palpitations: Secondary | ICD-10-CM

## 2016-06-14 DIAGNOSIS — I4729 Other ventricular tachycardia: Secondary | ICD-10-CM

## 2016-06-14 DIAGNOSIS — I472 Ventricular tachycardia: Secondary | ICD-10-CM

## 2016-06-14 DIAGNOSIS — I428 Other cardiomyopathies: Secondary | ICD-10-CM | POA: Diagnosis not present

## 2016-06-14 LAB — CBC WITH DIFFERENTIAL/PLATELET
BASOS PCT: 1 %
Basophils Absolute: 47 cells/uL (ref 0–200)
Eosinophils Absolute: 235 cells/uL (ref 15–500)
Eosinophils Relative: 5 %
HEMATOCRIT: 40.3 % (ref 35.0–45.0)
Hemoglobin: 13.1 g/dL (ref 11.7–15.5)
LYMPHS PCT: 26 %
Lymphs Abs: 1222 cells/uL (ref 850–3900)
MCH: 28.9 pg (ref 27.0–33.0)
MCHC: 32.5 g/dL (ref 32.0–36.0)
MCV: 89 fL (ref 80.0–100.0)
MONOS PCT: 10 %
MPV: 10.6 fL (ref 7.5–12.5)
Monocytes Absolute: 470 cells/uL (ref 200–950)
NEUTROS PCT: 58 %
Neutro Abs: 2726 cells/uL (ref 1500–7800)
PLATELETS: 223 10*3/uL (ref 140–400)
RBC: 4.53 MIL/uL (ref 3.80–5.10)
RDW: 13.4 % (ref 11.0–15.0)
WBC: 4.7 10*3/uL (ref 3.8–10.8)

## 2016-06-14 LAB — TSH: TSH: 1.32 m[IU]/L

## 2016-06-14 MED ORDER — SIMVASTATIN 40 MG PO TABS: 20.0000 mg | ORAL_TABLET | Freq: Every day | ORAL | 3 refills | Status: DC

## 2016-06-14 MED ORDER — HYDROCHLOROTHIAZIDE 25 MG PO TABS: 25.0000 mg | ORAL_TABLET | Freq: Every day | ORAL | 3 refills | Status: DC

## 2016-06-14 MED ORDER — POTASSIUM CHLORIDE CRYS ER 10 MEQ PO TBCR: EXTENDED_RELEASE_TABLET | ORAL | 3 refills | Status: DC

## 2016-06-14 MED ORDER — METOPROLOL SUCCINATE ER 50 MG PO TB24: 75.0000 mg | ORAL_TABLET | Freq: Every day | ORAL | 3 refills | Status: DC

## 2016-06-14 NOTE — Progress Notes (Signed)
ICD check in clinic w/Brittain[facility] Experiment, [place]. Normal device function. Threshold and sensing consistent with previous device measurements. Impedance trends stable over time. (33) VT-NS episodes since 08/23/15. Histogram distribution appropriate for patient and level of activity. No changes made this session. Device programmed at appropriate safety margins. Device programmed to optimize intrinsic conduction. Estimated longevity 7 years. Pt will follow up with SK in 08-2016. Patient education completed including shock plan.

## 2016-06-14 NOTE — Progress Notes (Signed)
06/14/2016 [name]   1950/10/17  [id number]  Primary Physician Loura Pardon, MD Primary Cardiologist/ Electrophysiologist: Dr. Caryl Comes   Reason for Visit/CC: Palpitations.    HPI:  65 y/o AAF, followed by Dr. Caryl Comes. 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 LHC in 2013 showed no significant CAD. EF in 2012 was 35-40%. She has not sustained any ICD shocks. She also has a h/o frequent PACs as captured on 24 hr Holter monitor ordered in May 2016. She is on BB therapy with metoprolol. Additional past medical history includes hypothyroidism, on Synthroid. This is followed by her PCP.   She presents to clinic today with a complaint of tachypalpitatons. This happens almost daily. No dizziness, but she has mild associated CP with it. She gets dyspneic with certain activities. Her device interrogations have been reviewed. Her last check was 05/23/16. She was noted to have 7 new NSVT since prior remote check 02/22/16- the longest being 17 sec. She is currently asymptomatic in clinic today.    Current Meds  Medication Sig  . acetaminophen (TYLENOL) 500 MG tablet Take 500 mg by mouth every 6 (six) hours as needed (pain).   [name] aspirin 81 MG tablet Take 81 mg by mouth daily.  . carisoprodol (SOMA) 350 MG tablet Take 1 tablet (350 mg total) by mouth 4 (four) times daily.  . cholecalciferol (VITAMIN D) 1000 UNITS tablet Take 2,000 Units by mouth daily.  . fexofenadine (ALLEGRA) 180 MG tablet Take 180 mg by mouth daily as needed for allergies or rhinitis.  . fluticasone (FLONASE) 50 MCG/ACT nasal spray Place 2 sprays into both nostrils daily as needed for allergies or rhinitis.  . hydrochlorothiazide (HYDRODIURIL) 25 MG tablet TAKE 1 TABLET (25 MG TOTAL) BY MOUTH DAILY.  [name] latanoprost (XALATAN) 0.005 % ophthalmic solution Place 1 drop into both eyes at bedtime.  [name] levothyroxine (SYNTHROID) 50 MCG tablet Take 1 tablet (50  mcg total) by mouth daily.  . metoprolol succinate (TOPROL-XL) 50 MG 24 hr tablet Take 1 tablet (50 mg total) by mouth daily. Take with or immediately following a meal.  . Multiple Vitamins-Minerals (MULTIVITAMIN PO) Take 1 tablet by mouth daily.  . ondansetron (ZOFRAN) 4 MG tablet Take 1 tablet (4 mg total) by mouth every 6 (six) hours as needed for nausea.  . pantoprazole (PROTONIX) 40 MG tablet Take 1 tablet (40 mg total) by mouth 2 (two) times daily before a meal.  . polyethylene glycol powder (GLYCOLAX/MIRALAX) powder Take 17 grams by mouth daily  . potassium chloride (K-DUR,KLOR-CON) 10 MEQ tablet TAKE 1/2 TABLET (5MEQ TOTAL) EVERY DAY  . simvastatin (ZOCOR) 40 MG tablet Take 0.5 tablets (20 mg total) by mouth daily.  . traMADol (ULTRAM) 50 MG tablet Take 50 mg by mouth every 8 (eight) hours as needed. (PAIN)  . zolpidem (AMBIEN) 10 MG tablet TAKE 1 TABLET BY MOUTH AT BEDTIME AS NEEDED FOR SLEEP   Allergies  Allergen Reactions  . Ace Inhibitors Swelling    Angioedema; makes tongue "break out" Angioedema; makes tongue "break out"  . Atorvastatin Other (See Comments)    SEVERE MYALGIAS, MUSCLE PAIN SEVERE MYALGIAS, MUSCLE PAIN  . Ramipril Other (See Comments)    TONGUE ULCERS TONGUE ULCERS  . Rosuvastatin Other (See Comments)    MYALGIAS, SEVERE MUSCLE PAIN MYALGIAS, SEVERE MUSCLE PAIN  . Amitriptyline Hcl Other (See Comments)    Unknown  . Amitriptyline Hcl Other (  See Comments)    Unknown  . Iodine-131 Other (See Comments)  . Paroxetine Other (See Comments)    UNKNOWN UNKNOWN  . Carbamazepine Rash    REACTION: ? reaction REACTION: ? reaction  . Cetirizine Hcl Other (See Comments)    REACTION: sleepy  . Cetirizine Hcl Other (See Comments)    REACTION: sleepy  . Codeine Itching  . Dilantin [Phenytoin Sodium Extended] Rash  . Phenytoin Sodium Extended Rash   Past Medical History:  Diagnosis Date  . AICD (automatic cardioverter/defibrillator) present   . Anxiety   .  Arthritis   . Breast cancer ([place]) 2016   DCIS ER-/PR-  . Cerebral aneurysm, nonruptured   . Clostridium difficile infection   . Depressive disorder, not elsewhere classified   . Diverticulosis   . Diverticulosis of colon (without mention of hemorrhage)   . Esophageal reflux   . Fibromyalgia   . Gastritis   . GERD (gastroesophageal reflux disease)   . GI bleed   . Hiatal hernia   . Hiatal hernia   . History of hiatal hernia   . Hyperlipidemia   . Hypertension   . Hypothyroidism   . Internal hemorrhoids   . Obstructive sleep apnea (adult) (pediatric)   . Ostium secundum type atrial septal defect   . Other chronic nonalcoholic liver disease   . Other pulmonary embolism and infarction   . Paroxysmal ventricular tachycardia ([place])   . Pneumonia    history  . Presence of permanent cardiac pacemaker   . PUD (peptic ulcer disease)   . Radiation 02/03/15-03/10/15   Right Breast  . Sarcoid ([place])   . Stroke [facility]) 2013   tia  . Takotsubo syndrome   . Unspecified transient cerebral ischemia   . Unspecified vitamin D deficiency    Family History  Problem Relation Age of Onset  . Diabetes Mother   . Alzheimer's disease Mother   . Other Brother     Thyroid problem  . Allergies Sister   . Heart disease Sister   . Heart disease Brother   . Prostate cancer Brother 58    same brother as throat cancer  . Throat cancer Brother     dx in his 14s; also a smoker  . Heart disease Father   . Cancer Maternal Grandmother 2    colon cancer or abdominal cancer  . Lung cancer Maternal Grandfather 86  . Breast cancer Maternal Aunt 32  . Colon cancer Maternal Aunt 64    same sister as breast at 67  . Lung cancer Maternal Uncle   . Breast cancer Maternal Aunt     dx in her 19s  . Pancreatic cancer Cousin 49    maternal first cousin  . Breast cancer Cousin     paternal first cousin twice removed died in her 68s  . Cardiomyopathy      Family history  . Heart attack Sister   .  Hypertension Sister   . Hypertension Brother   . Stroke Neg Hx    Past Surgical History:  Procedure Laterality Date  . ABDOMINAL HYSTERECTOMY    . ABI  2006   normal  . BREAST LUMPECTOMY WITH RADIOACTIVE SEED LOCALIZATION Right 01/03/2015   Procedure: BREAST LUMPECTOMY WITH RADIOACTIVE SEED LOCALIZATION;  Surgeon: Autumn Messing III, MD;  Location: [place];  Service: General;  Laterality: Right;  . CARDIAC DEFIBRILLATOR PLACEMENT  2006; 2012   BSX single chamber ICD  . carotid dopplers  2006   neg  .  CEREBRAL ANEURYSM REPAIR  02/1999  . hospitalization  2004   GI bleed, PUD, diverticulosis (EGD,colonscopy)  . hospitalization     PE, NSVT, s/p defib  . KNEE ARTHROSCOPY     bilateral  . LEFT HEART CATHETERIZATION WITH CORONARY ANGIOGRAM N/A 02/22/2012   Procedure: LEFT HEART CATHETERIZATION WITH CORONARY ANGIOGRAM;  Surgeon: Burnell Blanks, MD;  Location: [facility] CATH LAB;  Service: Cardiovascular;  Laterality: N/A;  . PARTIAL HYSTERECTOMY     Fibroids  . TOTAL KNEE ARTHROPLASTY Left 02/13/2016   Procedure: TOTAL KNEE ARTHROPLASTY;  Surgeon: Vickey Huger, MD;  Location: [place];  Service: Orthopedics;  Laterality: Left;   Social History   Social History  . Marital status: Divorced    Spouse name: N/A  . Number of children: 2  . Years of education: N/A   Occupational History  . DISABLED   .  Disability   Social History Main Topics  . Smoking status: Former Smoker    Packs/day: 1.00    Years: 15.00    Types: Cigarettes    Quit date: 08/20/1989  . Smokeless tobacco: Never Used     Comment: Started at 49; less than 1 PPD  . Alcohol use 0.0 oz/week     Comment: rare  . Drug use: No  . Sexual activity: No   Other Topics Concern  . Not on file   Social History Narrative   Retired from [employer]      2 children      Lives alone      Divorced     Review of Systems: General: negative for chills, fever, night sweats or weight changes.  Cardiovascular: negative for chest  pain, dyspnea on exertion, edema, orthopnea, palpitations, paroxysmal nocturnal dyspnea or shortness of breath Dermatological: negative for rash Respiratory: negative for cough or wheezing Urologic: negative for hematuria Abdominal: negative for nausea, vomiting, diarrhea, bright red blood per rectum, melena, or hematemesis Neurologic: negative for visual changes, syncope, or dizziness All other systems reviewed and are otherwise negative except as noted above.   Physical Exam:  Blood pressure 126/74, pulse 69, height 5\' 6"  (1.676 m), weight 203 lb 12.8 oz (92.4 kg).  General appearance: alert, cooperative and no distress Neck: no carotid bruit and no JVD Lungs: clear to auscultation bilaterally Heart: regular rate and rhythm, S1, S2 normal, no murmur, click, rub or gallop Extremities: extremities normal, atraumatic, no cyanosis or edema Pulses: 2+ and symmetric Skin: warm and dry Neurologic: Grossly normal  EKG not performed. Device Interrogation:   ASSESSMENT AND PLAN:   1. H/o VT: normal LHC in 2013. Has a [place]. Followed by Dr. Caryl Comes. She denies any shocks. Interrogation reveals normal functioning, PVCs and NSVT.   2. Palpitations: Device was interrogated in clinic today. This showed a few brief episodes of NSVT and PVCs. Her resting HR currently is 69 bpm. BP is 126/74. We will increase her metoprolol XL from 50 mg to 75 mg daily. We will also check a CBC, BMP, Mg and TSH. Keep f/u with Dr. Caryl Comes.    Lyda Jester PA-C 06/14/2016 3:01 PM

## 2016-06-14 NOTE — Patient Instructions (Addendum)
Your physician has recommended you make the following change in your medication: INCREASE  METOPROLOL TO   75  MG  EVERY DAY  Your physician recommends that you return for lab work in:   [place]  AND  [facility]    Your physician recommends that you schedule a follow-up appointment in:  [place]  PLANNED

## 2016-06-15 LAB — BASIC METABOLIC PANEL
BUN: 13 mg/dL (ref 7–25)
CHLORIDE: 102 mmol/L (ref 98–110)
CO2: 27 mmol/L (ref 20–31)
CREATININE: 0.84 mg/dL (ref 0.50–0.99)
Calcium: 10.2 mg/dL (ref 8.6–10.4)
Glucose, Bld: 79 mg/dL (ref 65–99)
POTASSIUM: 3.8 mmol/L (ref 3.5–5.3)
Sodium: 141 mmol/L (ref 135–146)

## 2016-06-15 LAB — MAGNESIUM: MAGNESIUM: 2.1 mg/dL (ref 1.5–2.5)

## 2016-08-02 ENCOUNTER — Encounter: Payer: Self-pay | Admitting: Cardiology

## 2016-08-09 ENCOUNTER — Ambulatory Visit: Payer: Medicare Other | Admitting: Adult Health

## 2016-08-16 ENCOUNTER — Ambulatory Visit (INDEPENDENT_AMBULATORY_CARE_PROVIDER_SITE_OTHER): Payer: Medicare Other | Admitting: Cardiology

## 2016-08-16 ENCOUNTER — Encounter: Payer: Self-pay | Admitting: Cardiology

## 2016-08-16 VITALS — BP 130/70 | HR 70 | Ht 66.0 in | Wt 205.8 lb

## 2016-08-16 DIAGNOSIS — R000 Tachycardia, unspecified: Secondary | ICD-10-CM | POA: Diagnosis not present

## 2016-08-16 DIAGNOSIS — Z95810 Presence of automatic (implantable) cardiac defibrillator: Secondary | ICD-10-CM

## 2016-08-16 DIAGNOSIS — I5042 Chronic combined systolic (congestive) and diastolic (congestive) heart failure: Secondary | ICD-10-CM

## 2016-08-16 DIAGNOSIS — R002 Palpitations: Secondary | ICD-10-CM | POA: Diagnosis not present

## 2016-08-16 DIAGNOSIS — R0602 Shortness of breath: Secondary | ICD-10-CM | POA: Diagnosis not present

## 2016-08-16 DIAGNOSIS — I472 Ventricular tachycardia, unspecified: Secondary | ICD-10-CM

## 2016-08-16 DIAGNOSIS — I428 Other cardiomyopathies: Secondary | ICD-10-CM

## 2016-08-16 DIAGNOSIS — I10 Essential (primary) hypertension: Secondary | ICD-10-CM

## 2016-08-16 MED ORDER — METOPROLOL TARTRATE 75 MG PO TABS: 75.0000 mg | ORAL_TABLET | Freq: Two times a day (BID) | ORAL | 0 refills | Status: DC

## 2016-08-16 MED ORDER — METOPROLOL TARTRATE 75 MG PO TABS: 75.0000 mg | ORAL_TABLET | Freq: Two times a day (BID) | ORAL | 3 refills | Status: DC

## 2016-08-16 MED ORDER — METOPROLOL SUCCINATE ER 50 MG PO TB24: 75.0000 mg | ORAL_TABLET | Freq: Every day | ORAL | 3 refills | Status: DC

## 2016-08-16 MED ORDER — METOPROLOL SUCCINATE ER 25 MG PO TB24: 75.0000 mg | ORAL_TABLET | Freq: Two times a day (BID) | ORAL | 1 refills | Status: DC

## 2016-08-16 MED ORDER — METOPROLOL SUCCINATE ER 25 MG PO TB24: 75.0000 mg | ORAL_TABLET | Freq: Two times a day (BID) | ORAL | 0 refills | Status: DC

## 2016-08-16 NOTE — Progress Notes (Signed)
Cardiology Office Note   Date:  08/16/2016   ID:  [name], DOB [birth year], MRN [id number]  PCP:  Adriana Pardon, MD  Cardiologist:  Dr. Caryl Comes    Chief Complaint  Patient presents with  . Shortness of Breath  . Tachycardia      History of Present Illness: [name] is a 65 y.o. female who presents for dizziness with bending over.    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 LHC in 2013 showed no significant CAD. EF in 2012 was 35-40%. She has not sustained any ICD shocks. She also has a h/o frequent PACs as captured on 24 hr Holter monitor ordered in May 2016. She is on BB therapy with metoprolol. Additional past medical history includes hypothyroidism, on Synthroid. This is followed by her PCP.   Her device interrogations have been reviewed. Her last check was 05/23/16. She was noted to have 7 new NSVT since prior remote check 02/22/16- the longest being 17 sec. She is currently asymptomatic in clinic today.  Device was interrogated in clinic today. This showed a few brief episodes of NSVT and PVCs. Her resting HR currently is 69 bpm. BP is 126/74. We will increase her metoprolol XL from 50 mg to 75 mg BID. We will also check a  BMP and BNP and will get Echo.[name] Keep f/u with Dr. Caryl Comes on the 9th.   Rt knee with severe arthritis.  Needs total knee and would needs cardiology clearance but she is to see Dr. Caryl Comes on the 9th.    Today she called in to be seen for SOB at rest and activity.  She has tachycardia prior/with these episodes.  When she lies down at night her heart races.  Occ chest discomfort with these episodes.   Occurs freq.    Also increased stress her son has had 3 cardiac arrests recently now followed by HF team.       Past Medical History:  Diagnosis Date  . AICD (automatic cardioverter/defibrillator) present   . Anxiety   . Arthritis   . 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 Cerebral aneurysm, nonruptured   . Clostridium difficile infection   . Depressive disorder, not elsewhere classified   . Diverticulosis   . Diverticulosis of colon (without mention of hemorrhage)   . Esophageal reflux   . Fibromyalgia   . Gastritis   . GERD (gastroesophageal reflux disease)   . GI bleed   . Hiatal hernia   . Hiatal hernia   . History of hiatal hernia   . Hyperlipidemia   . Hypertension   . Hypothyroidism   . Internal hemorrhoids   . Obstructive sleep apnea (adult) (pediatric)   . Ostium secundum type atrial septal defect   . Other chronic nonalcoholic liver disease   . Other pulmonary embolism and infarction   . Paroxysmal ventricular tachycardia ([place])   . Pneumonia    history  . Presence of permanent cardiac pacemaker   . PUD (peptic ulcer disease)   . Radiation 02/03/15-03/10/15   Right Breast  . Sarcoid ([place])   . Stroke [facility]) 2013   tia  . Takotsubo syndrome   . Unspecified transient cerebral ischemia   . Unspecified vitamin D deficiency     Past Surgical History:  Procedure Laterality Date  . ABDOMINAL HYSTERECTOMY    . ABI  2006   normal  .  BREAST LUMPECTOMY WITH RADIOACTIVE SEED LOCALIZATION Right 01/03/2015   Procedure: BREAST LUMPECTOMY WITH RADIOACTIVE SEED LOCALIZATION;  Surgeon: Autumn Messing III, MD;  Location: [place];  Service: General;  Laterality: Right;  . CARDIAC DEFIBRILLATOR PLACEMENT  2006; 2012   BSX single chamber ICD  . carotid dopplers  2006   neg  . CEREBRAL ANEURYSM REPAIR  02/1999  . hospitalization  2004   GI bleed, PUD, diverticulosis (EGD,colonscopy)  . hospitalization     PE, NSVT, s/p defib  . KNEE ARTHROSCOPY     bilateral  . LEFT HEART CATHETERIZATION WITH CORONARY ANGIOGRAM N/A 02/22/2012   Procedure: LEFT HEART CATHETERIZATION WITH CORONARY ANGIOGRAM;  Surgeon: Burnell Blanks, MD;  Location: [facility] CATH LAB;  Service: Cardiovascular;  Laterality: N/A;  . PARTIAL HYSTERECTOMY     Fibroids  . TOTAL KNEE  ARTHROPLASTY Left 02/13/2016   Procedure: TOTAL KNEE ARTHROPLASTY;  Surgeon: Vickey Huger, MD;  Location: [place];  Service: Orthopedics;  Laterality: Left;     Current Outpatient Prescriptions  Medication Sig Dispense Refill  . acetaminophen (TYLENOL) 500 MG tablet Take 500 mg by mouth every 6 (six) hours as needed (pain).     [name] aspirin 81 MG tablet Take 81 mg by mouth daily.    . carisoprodol (SOMA) 350 MG tablet Take 1 tablet (350 mg total) by mouth 4 (four) times daily. 60 tablet 0  . cholecalciferol (VITAMIN D) 1000 UNITS tablet Take 2,000 Units by mouth daily.    . fexofenadine (ALLEGRA) 180 MG tablet Take 180 mg by mouth daily as needed for allergies or rhinitis.    . fluticasone (FLONASE) 50 MCG/ACT nasal spray Place 2 sprays into both nostrils daily as needed for allergies or rhinitis. 48 g 3  . hydrochlorothiazide (HYDRODIURIL) 25 MG tablet Take 1 tablet (25 mg total) by mouth daily. 90 tablet 3  . latanoprost (XALATAN) 0.005 % ophthalmic solution Place 1 drop into both eyes at bedtime.    [name] levothyroxine (SYNTHROID) 50 MCG tablet Take 1 tablet (50 mcg total) by mouth daily. 90 tablet 3  . Multiple Vitamins-Minerals (MULTIVITAMIN PO) Take 1 tablet by mouth daily.    . ondansetron (ZOFRAN) 4 MG tablet Take 1 tablet (4 mg total) by mouth every 6 (six) hours as needed for nausea. 20 tablet 0  . pantoprazole (PROTONIX) 40 MG tablet Take 1 tablet (40 mg total) by mouth 2 (two) times daily before a meal. 180 tablet 3  . polyethylene glycol powder (GLYCOLAX/MIRALAX) powder Take 17 grams by mouth daily 1080 g 0  . potassium chloride (K-DUR,KLOR-CON) 10 MEQ tablet TAKE 1/2 TABLET (5MEQ TOTAL) EVERY DAY 45 tablet 3  . simvastatin (ZOCOR) 40 MG tablet Take 0.5 tablets (20 mg total) by mouth daily. 45 tablet 3  . traMADol (ULTRAM) 50 MG tablet Take 50 mg by mouth every 8 (eight) hours as needed. (PAIN)    . zolpidem (AMBIEN) 10 MG tablet TAKE 1 TABLET BY MOUTH AT BEDTIME AS NEEDED FOR SLEEP 90  tablet 1  . Metoprolol Tartrate 75 MG TABS Take 75 mg by mouth 2 (two) times daily. 180 tablet 3   No current facility-administered medications for this visit.     Allergies:   Ace inhibitors; Atorvastatin; Ramipril; Rosuvastatin; Amitriptyline hcl; Amitriptyline hcl; Iodine-131; Paroxetine; Carbamazepine; Cetirizine hcl; Cetirizine hcl; Codeine; Dilantin [phenytoin sodium extended]; and Phenytoin sodium extended    Social History:  The patient  reports that she quit smoking about 27 years ago. Her smoking use included Cigarettes.  She has a 15.00 pack-year smoking history. She has never used smokeless tobacco. She reports that she drinks alcohol. She reports that she does not use drugs.   Family History:  The patient's family history includes Allergies in her sister; Alzheimer's disease in her mother; Breast cancer in her cousin and maternal aunt; Breast cancer (age of onset: 22) in her maternal aunt; Cancer (age of onset: 89) in her maternal grandmother; Colon cancer (age of onset: 67) in her maternal aunt; Diabetes in her mother; Heart attack in her sister; Heart disease in her brother, father, and sister; Hypertension in her brother and sister; Lung cancer in her maternal uncle; Lung cancer (age of onset: 5) in her maternal grandfather; Other in her brother; Pancreatic cancer (age of onset: 51) in her cousin; Prostate cancer (age of onset: 27) in her brother; Throat cancer in her brother.    ROS:  General:no colds or fevers, no weight changes Skin:no rashes or ulcers HEENT:no blurred vision, no congestion CV:see HPI PUL:see HPI GI:no diarrhea constipation or melena, no indigestion GU:no hematuria, no dysuria MS:no joint pain, no claudication Neuro:no syncope, no lightheadedness Endo:no diabetes, + thyroid disease  Wt Readings from Last 3 Encounters:  08/16/16 205 lb 12.8 oz (93.4 kg)  06/14/16 203 lb 12.8 oz (92.4 kg)  05/15/16 203 lb 6.4 oz (92.3 kg)     PHYSICAL EXAM: VS:  BP  130/70   Pulse 70   Ht 5\' 6"  (1.676 m)   Wt 205 lb 12.8 oz (93.4 kg)   LMP  (LMP Unknown)   BMI 33.22 kg/m  , BMI Body mass index is 33.22 kg/m. General:Pleasant affect, NAD Skin:Warm and dry, brisk capillary refill HEENT:normocephalic, sclera clear, mucus membranes moist Neck:supple, mild JVD, no bruits  Heart:S1S2 RRR without murmur, gallup, rub or click Lungs:clear without rales, rhonchi, or wheezes [id number], non tender, + BS, do not palpate liver spleen or masses Ext:no to tr lower ext edema, 2+ pedal pulses, 2+ radial pulses Neuro:alert and oriented X 3, MAE, follows commands, + facial symmetry    EKG:  EKG is ordered today. The ekg ordered today demonstrates SR normal EKG   Recent Labs: 02/03/2016: ALT 29 06/14/2016: BUN 13; Creat 0.84; Hemoglobin 13.1; Magnesium 2.1; Platelets 223; Potassium 3.8; Sodium 141; TSH 1.32    Lipid Panel    Component Value Date/Time   CHOL 147 11/15/2015 0834   TRIG 85.0 11/15/2015 0834   HDL 48.20 11/15/2015 0834   CHOLHDL 3 11/15/2015 0834   VLDL 17.0 11/15/2015 0834   LDLCALC 82 11/15/2015 0834   LDLDIRECT 141.8 07/04/2007 0936       Other studies Reviewed: Additional studies/ records that were reviewed today include:   ICD interrogation-  Some SVT and PVCs  ECHO 11/2014 .Study Conclusions  - Left ventricle: The cavity size was normal. Wall thickness was   normal. Systolic function was normal. The estimated ejection   fraction was in the range of 50% to 55%. Wall motion was normal;   there were no regional wall motion abnormalities. Doppler   parameters are consistent with abnormal left ventricular   relaxation (grade 1 diastolic dysfunction). - Pulmonary arteries: Systolic pressure was mildly increased. PA   peak pressure: 36 mm Hg (S).  Impressions:  - Low normal LV function; EF 0000000; grade 1 diastolic dysfunction;   trace MR and TR; mildly elevated pulmonary pressure.    ASSESSMENT AND PLAN:  1.   Tachycardia leading to SOB with exertion.  ICD with brief  interrogation.  Some PVCs and 2 runs at 156 of SVT.    2. DOE may be due to tachycardia.   3. ICM with EF in 11/2014 of 50-55% PA pk pressure 36 mmHg. G1DD.  Will repeat echo.   She will keep follow up with Dr. Caryl Comes on the 9th.  At that time he will decide about cardiac clearance for Rt total knee.   Current medicines are reviewed with the patient today.  The patient Has no concerns regarding medicines.  The following changes have been made:  See above Labs/ tests ordered today include:see above  Disposition:   FU:  see above  Signed, Cecilie Kicks, NP  08/16/2016 5:15 PM    [address] [facility], [place] [place], [place] Phone: [phone]; Fax: [phone]

## 2016-08-16 NOTE — Patient Instructions (Addendum)
Medication Instructions:  Your physician has recommended you make the following change in your medication:  1.  INCREASE the Metoprolol to 75 mg taking 1 tablet twice a day   Labwork: TODAY:  BNP & BMET   Testing/Procedures: Your physician has requested that you have an echocardiogram (BEFORE 08/28/16).  Echocardiography is a painless test that uses sound waves to create images of your heart. It provides your doctor with information about the size and shape of your heart and how well your heart's chambers and valves are working. This procedure takes approximately one hour. There are no restrictions for this procedure.   Follow-Up: Your physician recommends that you schedule a follow-up appointment in: [place] 08/28/16    Any Other Special Instructions Will Be Listed Below (If Applicable). Echocardiogram An echocardiogram, or echocardiography, uses sound waves (ultrasound) to produce an image of your heart. The echocardiogram is simple, painless, obtained within a short period of time, and offers valuable information to your health care provider. The images from an echocardiogram can provide information such as:  Evidence of coronary artery disease (CAD).  Heart size.  Heart muscle function.  Heart valve function.  Aneurysm detection.  Evidence of a past heart attack.  Fluid buildup around the heart.  Heart muscle thickening.  Assess heart valve function. Tell a health care provider about:  Any allergies you have.  All medicines you are taking, including vitamins, herbs, eye drops, creams, and over-the-counter medicines.  Any problems you or family members have had with anesthetic medicines.  Any blood disorders you have.  Any surgeries you have had.  Any medical conditions you have.  Whether you are pregnant or may be pregnant. What happens before the procedure? No special preparation is needed. Eat and drink normally. What  happens during the procedure?  In order to produce an image of your heart, gel will be applied to your chest and a wand-like tool (transducer) will be moved over your chest. The gel will help transmit the sound waves from the transducer. The sound waves will harmlessly bounce off your heart to allow the heart images to be captured in real-time motion. These images will then be recorded.  You may need an IV to receive a medicine that improves the quality of the pictures. What happens after the procedure? You may return to your normal schedule including diet, activities, and medicines, unless your health care provider tells you otherwise. This information is not intended to replace advice given to you by your health care provider. Make sure you discuss any questions you have with your health care provider. Document Released: 08/03/2000 Document Revised: 03/24/2016 Document Reviewed: 04/13/2013 Elsevier Interactive Patient Education  2017 [employer].     If you need a refill on your cardiac medications before your next appointment, please call your pharmacy.

## 2016-08-17 LAB — BRAIN NATRIURETIC PEPTIDE: BRAIN NATRIURETIC PEPTIDE: 30.2 pg/mL (ref <100)

## 2016-08-17 LAB — BASIC METABOLIC PANEL
BUN: 12 mg/dL (ref 7–25)
CO2: 29 mmol/L (ref 20–31)
Calcium: 10.1 mg/dL (ref 8.6–10.4)
Chloride: 103 mmol/L (ref 98–110)
Creat: 0.7 mg/dL (ref 0.50–0.99)
GLUCOSE: 97 mg/dL (ref 65–99)
POTASSIUM: 4.2 mmol/L (ref 3.5–5.3)
SODIUM: 140 mmol/L (ref 135–146)

## 2016-08-23 ENCOUNTER — Ambulatory Visit: Payer: Medicare Other | Admitting: Pulmonary Disease

## 2016-08-24 ENCOUNTER — Ambulatory Visit (HOSPITAL_COMMUNITY)
Admission: RE | Admit: 2016-08-24 | Discharge: 2016-08-24 | Disposition: A | Discharge status: Home / Self Care | Payer: Medicare Other | Source: Ambulatory Visit | Attending: Cardiology | Admitting: Cardiology

## 2016-08-24 DIAGNOSIS — I361 Nonrheumatic tricuspid (valve) insufficiency: Secondary | ICD-10-CM | POA: Insufficient documentation

## 2016-08-24 DIAGNOSIS — R0602 Shortness of breath: Secondary | ICD-10-CM | POA: Insufficient documentation

## 2016-08-24 DIAGNOSIS — I10 Essential (primary) hypertension: Secondary | ICD-10-CM | POA: Insufficient documentation

## 2016-08-24 NOTE — Progress Notes (Signed)
Echocardiogram 2D Echocardiogram has been performed.  Aggie Cosier 08/24/2016, 10:40 AM

## 2016-08-28 ENCOUNTER — Encounter: Payer: Self-pay | Admitting: Internal Medicine

## 2016-08-28 ENCOUNTER — Ambulatory Visit (INDEPENDENT_AMBULATORY_CARE_PROVIDER_SITE_OTHER): Payer: Medicare Other | Admitting: Internal Medicine

## 2016-08-28 VITALS — BP 122/72 | HR 74 | Ht 66.0 in | Wt 205.6 lb

## 2016-08-28 DIAGNOSIS — I472 Ventricular tachycardia, unspecified: Secondary | ICD-10-CM

## 2016-08-28 DIAGNOSIS — Z95810 Presence of automatic (implantable) cardiac defibrillator: Secondary | ICD-10-CM | POA: Diagnosis not present

## 2016-08-28 DIAGNOSIS — I5042 Chronic combined systolic (congestive) and diastolic (congestive) heart failure: Secondary | ICD-10-CM | POA: Diagnosis not present

## 2016-08-28 DIAGNOSIS — R000 Tachycardia, unspecified: Secondary | ICD-10-CM

## 2016-08-28 DIAGNOSIS — I428 Other cardiomyopathies: Secondary | ICD-10-CM

## 2016-08-28 LAB — CUP PACEART INCLINIC DEVICE CHECK
Date Time Interrogation Session: 20180109050000
HIGH POWER IMPEDANCE MEASURED VALUE: 38 Ohm
HighPow Impedance: 64 Ohm
Implantable Lead Implant Date: 20050603
Implantable Lead Location: 753860
Implantable Pulse Generator Implant Date: 20110505
Lead Channel Impedance Value: 654 Ohm
Lead Channel Sensing Intrinsic Amplitude: 12.8 mV
Lead Channel Setting Pacing Pulse Width: 0.4 ms
MDC IDC LEAD MODEL: 185
MDC IDC LEAD SERIAL: 116340
MDC IDC MSMT LEADCHNL RV PACING THRESHOLD AMPLITUDE: 0.8 V
MDC IDC MSMT LEADCHNL RV PACING THRESHOLD PULSEWIDTH: 0.4 ms
MDC IDC PG SERIAL: 266301
MDC IDC SET LEADCHNL RV PACING AMPLITUDE: 2.4 V
MDC IDC SET LEADCHNL RV SENSING SENSITIVITY: 0.4 mV

## 2016-08-28 MED ORDER — METOPROLOL TARTRATE 50 MG PO TABS: 50.0000 mg | ORAL_TABLET | Freq: Every day | ORAL | 3 refills | Status: DC | PRN

## 2016-08-28 NOTE — Patient Instructions (Addendum)
Medication Instructions: - Your physician has recommended you make the following change in your medication: 1) TAKE Metoprolol tartrate 50 mg once daily as needed for palpitations.  Labwork: -None Ordered  Procedures/Testing: -None Ordered  Follow-Up: Your physician recommends that you schedule a follow-up appointment in: 3 months with Chanetta Marshall, NP.   If you need a refill on your cardiac medications before your next appointment, please call your pharmacy.

## 2016-08-28 NOTE — Progress Notes (Signed)
Patient Care Team: Abner Greenspan, MD as PCP - Betances III, MD as Consulting Physician (General Surgery) Truitt Merle, MD as Consulting Physician (Hematology) Thea Silversmith, MD as Consulting Physician (Radiation Oncology) Rockwell Germany, RN as Registered Nurse Mauro Kaufmann, RN as Registered Nurse Deboraha Sprang, MD as Consulting Physician (Cardiology) Holley Bouche, NP as Nurse Practitioner (Nurse Practitioner) Sylvan Cheese, NP as Nurse Practitioner (Nurse Practitioner) Chesley Mires, MD as Consulting Physician (Pulmonary Disease) Autumn Messing III, MD as Consulting Physician (General Surgery) Bo Merino, MD as Consulting Physician (Rheumatology)   HPI  [name] is a 66 y.o. 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  She was seen by [facility]-NP 2 weeks ago for complaints of tachypalps  Metop increased with some interval improvement  Under a great deal of stress with aborted cardiac arrest in her son      Past Medical History:  Diagnosis Date  . AICD (automatic cardioverter/defibrillator) present   . Anxiety   . Arthritis   . 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 Cerebral aneurysm, nonruptured   . Clostridium difficile infection   . Depressive disorder, not elsewhere classified   . Diverticulosis   . Diverticulosis of colon (without mention of hemorrhage)   . Esophageal reflux   .  Fibromyalgia   . Gastritis   . GERD (gastroesophageal reflux disease)   . GI bleed   . Hiatal hernia   . Hiatal hernia   . History of hiatal hernia   . Hyperlipidemia   . Hypertension   . Hypothyroidism   . Internal hemorrhoids   . Obstructive sleep apnea (adult) (pediatric)   . Ostium secundum type atrial septal defect   . Other chronic nonalcoholic liver disease   . Other pulmonary embolism and infarction   . Paroxysmal ventricular tachycardia ([place])   . Pneumonia    history  . Presence of permanent cardiac pacemaker   . PUD (peptic ulcer disease)   . Radiation 02/03/15-03/10/15   Right Breast  . Sarcoid ([place])   . Stroke [facility]) 2013   tia  . Takotsubo syndrome   . Unspecified transient cerebral ischemia   . Unspecified vitamin D deficiency     Past Surgical History:  Procedure Laterality Date  . ABDOMINAL HYSTERECTOMY    . ABI  2006   normal  . BREAST LUMPECTOMY WITH RADIOACTIVE SEED LOCALIZATION Right 01/03/2015   Procedure: BREAST LUMPECTOMY WITH RADIOACTIVE SEED LOCALIZATION;  Surgeon: Autumn Messing III, MD;  Location: [place];  Service: General;  Laterality: Right;  . CARDIAC DEFIBRILLATOR PLACEMENT  2006; 2012   BSX single chamber ICD  . carotid dopplers  2006   neg  . CEREBRAL ANEURYSM REPAIR  02/1999  . hospitalization  2004   GI bleed, PUD, diverticulosis (EGD,colonscopy)  . hospitalization     PE, NSVT, s/p defib  . KNEE ARTHROSCOPY  bilateral  . LEFT HEART CATHETERIZATION WITH CORONARY ANGIOGRAM N/A 02/22/2012   Procedure: LEFT HEART CATHETERIZATION WITH CORONARY ANGIOGRAM;  Surgeon: Burnell Blanks, MD;  Location: [facility] CATH LAB;  Service: Cardiovascular;  Laterality: N/A;  . PARTIAL HYSTERECTOMY     Fibroids  . TOTAL KNEE ARTHROPLASTY Left 02/13/2016   Procedure: TOTAL KNEE ARTHROPLASTY;  Surgeon: Vickey Huger, MD;  Location: [place];  Service: Orthopedics;  Laterality: Left;    Current Outpatient Prescriptions  Medication Sig Dispense Refill  .  acetaminophen (TYLENOL) 500 MG tablet Take 500 mg by mouth every 6 (six) hours as needed (pain).     [name] aspirin 81 MG tablet Take 81 mg by mouth daily.    . carisoprodol (SOMA) 350 MG tablet Take 1 tablet (350 mg total) by mouth 4 (four) times daily. 60 tablet 0  . cholecalciferol (VITAMIN D) 1000 UNITS tablet Take 2,000 Units by mouth daily.    . fexofenadine (ALLEGRA) 180 MG tablet Take 180 mg by mouth daily as needed for allergies or rhinitis.    . fluticasone (FLONASE) 50 MCG/ACT nasal spray Place 2 sprays into both nostrils daily as needed for allergies or rhinitis. 48 g 3  . hydrochlorothiazide (HYDRODIURIL) 25 MG tablet Take 1 tablet (25 mg total) by mouth daily. 90 tablet 3  . latanoprost (XALATAN) 0.005 % ophthalmic solution Place 1 drop into both eyes at bedtime.    [name] levothyroxine (SYNTHROID) 50 MCG tablet Take 1 tablet (50 mcg total) by mouth daily. 90 tablet 3  . Metoprolol Tartrate 75 MG TABS Take 75 mg by mouth 2 (two) times daily. 180 tablet 3  . Multiple Vitamins-Minerals (MULTIVITAMIN PO) Take 1 tablet by mouth daily.    . ondansetron (ZOFRAN) 4 MG tablet Take 1 tablet (4 mg total) by mouth every 6 (six) hours as needed for nausea. 20 tablet 0  . pantoprazole (PROTONIX) 40 MG tablet Take 1 tablet (40 mg total) by mouth 2 (two) times daily before a meal. 180 tablet 3  . polyethylene glycol powder (GLYCOLAX/MIRALAX) powder Take 17 grams by mouth daily 1080 g 0  . potassium chloride (K-DUR,KLOR-CON) 10 MEQ tablet TAKE 1/2 TABLET (5MEQ TOTAL) EVERY DAY 45 tablet 3  . simvastatin (ZOCOR) 40 MG tablet Take 0.5 tablets (20 mg total) by mouth daily. 45 tablet 3  . traMADol (ULTRAM) 50 MG tablet Take 50 mg by mouth every 8 (eight) hours as needed. (PAIN)    . zolpidem (AMBIEN) 10 MG tablet TAKE 1 TABLET BY MOUTH AT BEDTIME AS NEEDED FOR SLEEP 90 tablet 1   No current facility-administered medications for this visit.     Allergies  Allergen Reactions  . Ace Inhibitors Swelling     Angioedema; makes tongue "break out" Angioedema; makes tongue "break out"  . Atorvastatin Other (See Comments)    SEVERE MYALGIAS, MUSCLE PAIN SEVERE MYALGIAS, MUSCLE PAIN  . Ramipril Other (See Comments)    TONGUE ULCERS TONGUE ULCERS  . Rosuvastatin Other (See Comments)    MYALGIAS, SEVERE MUSCLE PAIN MYALGIAS, SEVERE MUSCLE PAIN  . Amitriptyline Hcl Other (See Comments)    Unknown  . Amitriptyline Hcl Other (See Comments)    Unknown  . Iodine-131 Other (See Comments)  . Paroxetine Other (See Comments)    UNKNOWN UNKNOWN  . Carbamazepine Rash    REACTION: ? reaction REACTION: ? reaction  . Cetirizine Hcl Other (See Comments)    REACTION: sleepy  . Cetirizine Hcl Other (See Comments)    REACTION: sleepy  .  Codeine Itching  . Dilantin [Phenytoin Sodium Extended] Rash  . Phenytoin Sodium Extended Rash    Review of Systems negative except from HPI and PMH  Physical Exam LMP  (LMP Unknown)  Well developed and well nourished in no acute distress HENT normal E scleral and icterus clear Neck Supple JVP flat; carotids brisk and full Clear to ausculation Device pocket well healed; without hematoma or erythema.  There is no tethering  Regular rate and rhythm, no murmurs gallops or rub Soft with active bowel sounds No clubbing cyanosis tr Edema Alert and oriented, grossly normal motor and sensory function Skin Warm and Dry  ECG SR 70 20/08/37 Otherwise normal   Assessment and  Plan  Nonischemic Cardiomyopathy  ? sarcoid  CHF  Chronic mixed    Ventricular Tachycardia no intercurrent sustainedVT  Palpitations previously identified as PVCs  Sinus tachy  1AVB  Hypertension   ICD [employer]  The patient's device was interrogated.  The information was reviewed. No changes were made in the programming.    She continues with palpitations. Previously they've been identified as PVCs. More recently the device suggests that they may be atrial tachycardia. They  are better with the increased dose of metoprolol. I've encouraged her to take an extra 50 mg of metoprolol as needed. In the event that she continues to have symptoms, consider the role of a calcium blocker. We will also consider the use of an antiarrhythmic drug..    Pain increasingly depressed at the familial nature of her cardiomyopathy with aborted cardiac arrest of her son and the cardiac arrest and death of her sister  We will look into genetic evaluation

## 2016-09-11 ENCOUNTER — Encounter: Payer: Self-pay | Admitting: Pulmonary Disease

## 2016-09-11 ENCOUNTER — Ambulatory Visit (INDEPENDENT_AMBULATORY_CARE_PROVIDER_SITE_OTHER): Payer: Medicare Other | Admitting: Pulmonary Disease

## 2016-09-11 VITALS — BP 110/70 | HR 68 | Ht 66.5 in | Wt 210.0 lb

## 2016-09-11 DIAGNOSIS — J479 Bronchiectasis, uncomplicated: Secondary | ICD-10-CM

## 2016-09-11 DIAGNOSIS — Z9989 Dependence on other enabling machines and devices: Secondary | ICD-10-CM

## 2016-09-11 DIAGNOSIS — G4733 Obstructive sleep apnea (adult) (pediatric): Secondary | ICD-10-CM

## 2016-09-11 DIAGNOSIS — R058 Other specified cough: Secondary | ICD-10-CM

## 2016-09-11 DIAGNOSIS — R05 Cough: Secondary | ICD-10-CM

## 2016-09-11 DIAGNOSIS — Z789 Other specified health status: Secondary | ICD-10-CM | POA: Diagnosis not present

## 2016-09-11 MED ORDER — MONTELUKAST SODIUM 10 MG PO TABS: 10.0000 mg | ORAL_TABLET | Freq: Every day | ORAL | 5 refills | Status: DC

## 2016-09-11 NOTE — Patient Instructions (Signed)
Montelukast (singulair) 10 mg pill nightly  Will change your CPAP to 7 cm H2O and arrange for chin strap for CPAP mask  Can look up CPAP.com for cleaning solutions  Can look up CMS.gov for medicare guidelines for replacing CPAP supplies  Follow up in 2 months with Dr. Halford Chessman or Nurse Practitoner

## 2016-09-11 NOTE — Progress Notes (Signed)
Current Outpatient Prescriptions on File Prior to Visit  Medication Sig  . acetaminophen (TYLENOL) 500 MG tablet Take 500 mg by mouth every 6 (six) hours as needed (pain).   [name] aspirin 81 MG tablet Take 81 mg by mouth daily.  . cholecalciferol (VITAMIN D) 1000 UNITS tablet Take 2,000 Units by mouth daily.  . fexofenadine (ALLEGRA) 180 MG tablet Take 180 mg by mouth daily as needed for allergies or rhinitis.  . fluticasone (FLONASE) 50 MCG/ACT nasal spray Place 2 sprays into both nostrils daily as needed for allergies or rhinitis.  . hydrochlorothiazide (HYDRODIURIL) 25 MG tablet Take 1 tablet (25 mg total) by mouth daily.  [name] latanoprost (XALATAN) 0.005 % ophthalmic solution Place 1 drop into both eyes at bedtime.  [name] levothyroxine (SYNTHROID) 50 MCG tablet Take 1 tablet (50 mcg total) by mouth daily.  . metoprolol (LOPRESSOR) 50 MG tablet Take 1 tablet (50 mg total) by mouth daily as needed.  . Metoprolol Tartrate 75 MG TABS Take 75 mg by mouth 2 (two) times daily.  . Multiple Vitamins-Minerals (MULTIVITAMIN PO) Take 1 tablet by mouth daily.  . pantoprazole (PROTONIX) 40 MG tablet Take 1 tablet (40 mg total) by mouth 2 (two) times daily before a meal.  . polyethylene glycol powder (GLYCOLAX/MIRALAX) powder Take 17 grams by mouth daily  . potassium chloride (K-DUR,KLOR-CON) 10 MEQ tablet TAKE 1/2 TABLET (5MEQ TOTAL) EVERY DAY  . simvastatin (ZOCOR) 40 MG tablet Take 0.5 tablets (20 mg total) by mouth daily.  [name] zolpidem (AMBIEN) 10 MG tablet TAKE 1 TABLET BY MOUTH AT BEDTIME AS NEEDED FOR SLEEP  . carisoprodol (SOMA) 350 MG tablet Take 1 tablet (350 mg total) by mouth 4 (four) times daily. (Patient not taking: Reported on 09/11/2016)  . [DISCONTINUED] metoprolol succinate (TOPROL-XL) 50 MG 24 hr tablet Take 2 tablets (100 mg total) by mouth daily. Take with or immediately following a meal.  . [DISCONTINUED] potassium chloride (K-DUR) 10 MEQ tablet Take 0.5 tablets (5 mEq total) by mouth daily. 1/2 tab  po qd   No current facility-administered medications on file prior to visit.      Chief Complaint  Patient presents with  . Sleep Apnea    pt stated that her cpap is doing good.  she stated that she is not sure how the supplies works for the cpap machine.  she is washing the tubing but is worried about mold.      Sleep tests PSG 11/29/08 >> AHI 9 HST 11/10/15 >> AHI 7.1, SaO2 low 75% Auto CPAP 08/12/16 to 09/10/16 >> used on 29 of 30 nights with average 4 hrs 34 min.  Average AHI 0.7 with median CPAP 7 and 95 th percentile CPAP 9 cm H2O  Pulmonary tests CT chest 04/10/12 >> b/l lower lobe cylindrical BTX and some in upper lobes PFT 04/17/12 >> FEV1 2.60 (110%), FEV1% 85, TLC 4.05 (77%), DLCO 44% CT angio chest 10/26/15 >> patchy GGO in periphery of lungs b/l, basilar BTX, no PE; no significant change compared to 2013  Cardiac tests Echo 08/24/16 >> EF 55 to 60%, grade 1 DD, mild/mod TR  Past medical history VT s/p AICD, ASD, CVA, Tako tsubo CM, PE 2005, NASH, Hypothyroidism, GERD, Diverticulosis, Depression, C diff, Anxiety, Breast cancer 2016, Nummular eczema  Past surgical history, Family history, Social history, Allergies reviewed.  Vital Signs BP 110/70 (BP Location: Right Arm, Patient Position: Sitting, Cuff Size: Normal)   Pulse 68   Ht 5' 6.5" (1.689 m)  Wt 210 lb (95.3 kg)   LMP  (LMP Unknown)   SpO2 98%   BMI 33.39 kg/m   History of Present Illness [name] is a 66 y.o. female former smoker with bronchiectasis and obstructive sleep apnea.  She is getting more used to using CPAP.  She has nasal mask.  She gets dry in her mouth.  She thinks her mouth is opening while she is asleep.  She likes nasal mask.  She has tried increasing humidifier.  She only sleeps 5 hrs per night.  She has noticed more trouble with her breathing.  She feels tight in her chest.  She has more nasal congestion and PND.    She was seen recently by cardiology and had meds increased for  PVCs.  Physical Exam  General - pleasant ENT - clear nasal drainage, no sinus tenderness, no oral exudate, no LAN Cardiac - regular, no murmur Chest - no wheeze or crackles Back - no tenderness Abd - soft, non tender Ext - no edema Neuro - normal strength Skin - no rashes Psych - normal mood   Reviewed her CPAP download with her  Assessment/Plan  Upper airway cough syndrome with allergic rhinitis. - will add singulair - continue flonase and allegra - might need further assessment for asthma if her symptoms persist  Obstructive sleep apnea. - she reports benefit from CPAP - she has trouble tolerating nasal mask due to mouth dryness - will change her to fixed setting of 7 cm H2O - will arrange for chin strap - discussed techniques to clean her supplies, and when she should be eligible for replacement supplies  Bronchiectasis. - monitor clinically   Patient Instructions  Montelukast (singulair) 10 mg pill nightly  Will change your CPAP to 7 cm H2O and arrange for chin strap for CPAP mask  Can look up CPAP.com for cleaning solutions  Can look up CMS.gov for medicare guidelines for replacing CPAP supplies  Follow up in 2 months with Dr. Halford Chessman or Nurse Practitoner   Chesley Mires, MD [facility] Pager:  [phone] 09/11/2016, 12:00 PM

## 2016-09-26 ENCOUNTER — Encounter: Payer: Self-pay | Admitting: Family Medicine

## 2016-09-26 ENCOUNTER — Ambulatory Visit (INDEPENDENT_AMBULATORY_CARE_PROVIDER_SITE_OTHER): Payer: Medicare Other | Admitting: Family Medicine

## 2016-09-26 VITALS — BP 110/58 | HR 67 | Temp 98.2°F | Ht 66.0 in | Wt 205.8 lb

## 2016-09-26 DIAGNOSIS — A5901 Trichomonal vulvovaginitis: Secondary | ICD-10-CM | POA: Diagnosis not present

## 2016-09-26 DIAGNOSIS — R350 Frequency of micturition: Secondary | ICD-10-CM

## 2016-09-26 DIAGNOSIS — Z113 Encounter for screening for infections with a predominantly sexual mode of transmission: Secondary | ICD-10-CM | POA: Insufficient documentation

## 2016-09-26 DIAGNOSIS — N898 Other specified noninflammatory disorders of vagina: Secondary | ICD-10-CM | POA: Diagnosis not present

## 2016-09-26 DIAGNOSIS — C50411 Malignant neoplasm of upper-outer quadrant of right female breast: Secondary | ICD-10-CM

## 2016-09-26 LAB — POC URINALSYSI DIPSTICK (AUTOMATED)
Bilirubin, UA: NEGATIVE
Glucose, UA: NEGATIVE
Nitrite, UA: NEGATIVE
Protein, UA: NEGATIVE
RBC UA: NEGATIVE
SPEC GRAV UA: 1.025
UROBILINOGEN UA: NEGATIVE
pH, UA: 7

## 2016-09-26 MED ORDER — METRONIDAZOLE 500 MG PO TABS: 500.0000 mg | ORAL_TABLET | Freq: Two times a day (BID) | ORAL | 0 refills | Status: DC

## 2016-09-26 NOTE — Patient Instructions (Signed)
We will culture your urine to make sure that you do not have a uti  Try to drink more water  Treating trichomonas vaginitis with oral flagyl twice daily for 10 day  Refrain from sexual activity  Will also do a check for gonorrhea and chlamydia   Let us know if symptoms do not improve

## 2016-09-26 NOTE — Progress Notes (Signed)
Pre visit review using our clinic review tool, if applicable. No additional management support is needed unless otherwise documented below in the visit note. 

## 2016-09-26 NOTE — Progress Notes (Signed)
Subjective:    Patient ID: [name], female    DOB: 1950/11/19, 66 y.o.   MRN: [id number]  HPI Here with pelvic pain and possible vaginitis   Pain in R pelvic areaand going to her low back    Never had a kidney stone in the past  ? If she drinks enough water Some frequency and urge incontinence for past few months- esp at night  No blood in urine   No odor to urine   Has a vaginal odor  Discharge - is brownish color- no blood  Some itching on the outside   Having more bms lately - careful to try and wipe front to back   Not sexually active at all   Results for orders placed or performed in visit on 09/26/16  GC/Chlamydia Probe Amp  Result Value Ref Range   CT Probe RNA NOT DETECTED    GC Probe RNA NOT DETECTED   POCT Urinalysis Dipstick (Automated)  Result Value Ref Range   Color, UA yellow    Clarity, UA clear    Glucose, UA negative    Bilirubin, UA negative    Ketones, UA ngetive    Spec Grav, UA 1.025    Blood, UA negative    pH, UA 7.0    Protein, UA negative    Urobilinogen, UA negative    Nitrite, UA negative    Leukocytes, UA Trace (A) Negative  POCT Wet Prep ([place])  Result Value Ref Range   Source Wet Prep POC vaginal    WBC, Wet Prep HPF POC many    Bacteria Wet Prep HPF POC Moderate (A) Few   BACTERIA WET PREP MORPHOLOGY POC     Clue Cells Wet Prep HPF POC None None   Clue Cells Wet Prep Whiff POC     Yeast Wet Prep HPF POC None    KOH Wet Prep POC neg    Trichomonas Wet Prep HPF POC Present (A) Absent     Patient Active Problem List   Diagnosis Date Noted  . Primary osteoarthritis of both hands 09/27/2016  . Primary osteoarthritis of both knees 09/27/2016  . Urinary frequency 09/26/2016  . Screen for STD (sexually transmitted disease) 09/26/2016  . Nasopharyngitis, 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/2016  . Postnasal drip 05/18/2016  . S/P total knee replacement 02/13/2016  . Chronic pain of both knees 12/06/2015  . Globus pharyngeus 05/17/2015    . Productive cough 05/17/2015  . Genetic testing 12/23/2014  . Family history of breast cancer   . Family history of colon cancer   . Family history of pancreatic cancer   . Breast cancer of upper-outer quadrant of right female breast ([place]) 11/23/2014  . Vaginal discharge 10/19/2014  . Trichomonal vaginitis 10/19/2014  . Allergy to ACE inhibitors 09/27/2014  . Encounter for Medicare annual wellness exam 06/27/2014  . Hyperglycemia 06/23/2013  . Routine general medical examination at a health care facility 06/23/2013  . Tongue lesion 02/17/2013  . Dizziness and giddiness 01/16/2013  . Disturbance of skin sensation 01/16/2013  . RLS (restless legs syndrome) 01/15/2013  . Hx of Clostridium difficile infection 10/10/2012  . Blood in stool 07/31/2012  . Bronchiectasis without acute exacerbation ([place]) 04/29/2012  . Dyspnea on exertion 04/02/2012  . Non-ischemic cardiomyopathy ([place]) 03/07/2012  . Colon cancer screening 01/11/2012  . Post-menopausal 01/11/2012  . Chest pain 11/07/2011  . Palpitations 04/17/2011  . Abnormal tympanic membrane 01/22/2011  . Obstructive sleep apnea 04/04/2010  . VENTRICULAR TACHYCARDIA 01/03/2009  .  ICD  [employer]  Single chamber 01/03/2009  . PATENT FORAMEN OVALE 09/29/2008  . Vitamin D deficiency 08/17/2008  . Hypothyroidism 11/18/2006  . Hyperlipidemia 11/18/2006  . DEPRESSION 11/18/2006  . Essential hypertension 11/18/2006  . MITRAL VALVE PROLAPSE 11/18/2006  . CEREBRAL ANEURYSM 11/18/2006  . ALLERGIC RHINITIS 11/18/2006  . GERD 11/18/2006  . DIVERTICULOSIS, COLON 11/18/2006  . Fatty liver 11/18/2006  . Fibromyalgia 11/18/2006   Past Medical History:  Diagnosis Date  . AICD (automatic cardioverter/defibrillator) present   . Anxiety   . Arthritis   . 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 Cerebral aneurysm, nonruptured   . Clostridium difficile infection   . Depressive disorder, not elsewhere classified   . Diverticulosis    . Diverticulosis of colon (without mention of hemorrhage)   . Esophageal reflux   . Fibromyalgia   . Gastritis   . GERD (gastroesophageal reflux disease)   . GI bleed   . Hiatal hernia   . Hiatal hernia   . History of hiatal hernia   . Hyperlipidemia   . Hypertension   . Hypothyroidism   . Internal hemorrhoids   . Obstructive sleep apnea (adult) (pediatric)   . Ostium secundum type atrial septal defect   . Other chronic nonalcoholic liver disease   . Other pulmonary embolism and infarction   . Paroxysmal ventricular tachycardia ([place])   . Pneumonia    history  . Presence of permanent cardiac pacemaker   . PUD (peptic ulcer disease)   . Radiation 02/03/15-03/10/15   Right Breast  . Sarcoid ([place])   . Stroke [facility]) 2013   tia  . Takotsubo syndrome   . Unspecified transient cerebral ischemia   . Unspecified vitamin D deficiency    Past Surgical History:  Procedure Laterality Date  . ABDOMINAL HYSTERECTOMY    . ABI  2006   normal  . BREAST LUMPECTOMY WITH RADIOACTIVE SEED LOCALIZATION Right 01/03/2015   Procedure: BREAST LUMPECTOMY WITH RADIOACTIVE SEED LOCALIZATION;  Surgeon: Autumn Messing III, MD;  Location: [facility];  Service: General;  Laterality: Right;  . CARDIAC DEFIBRILLATOR PLACEMENT  2006; 2012   BSX single chamber ICD  . carotid dopplers  2006   neg  . CEREBRAL ANEURYSM REPAIR  02/1999  . hospitalization  2004   GI bleed, PUD, diverticulosis (EGD,colonscopy)  . hospitalization     PE, NSVT, s/p defib  . KNEE ARTHROSCOPY     bilateral  . LEFT HEART CATHETERIZATION WITH CORONARY ANGIOGRAM N/A 02/22/2012   Procedure: LEFT HEART CATHETERIZATION WITH CORONARY ANGIOGRAM;  Surgeon: Burnell Blanks, MD;  Location: [facility] CATH LAB;  Service: Cardiovascular;  Laterality: N/A;  . PARTIAL HYSTERECTOMY     Fibroids  . TOTAL KNEE ARTHROPLASTY Left 02/13/2016   Procedure: TOTAL KNEE ARTHROPLASTY;  Surgeon: Vickey Huger, MD;  Location: [facility];  Service: Orthopedics;  Laterality: Left;    Social History  Substance Use Topics  . Smoking status: Former Smoker    Packs/day: 1.00    Years: 15.00    Types: Cigarettes    Quit date: 08/20/1989  . Smokeless tobacco: Never Used     Comment: Started at 39; less than 1 PPD  . Alcohol use 0.0 oz/week     Comment: rare   Family History  Problem Relation Age of Onset  . Diabetes Mother   . Alzheimer's disease Mother   . Other Brother     Thyroid problem  . Allergies Sister   . Heart disease Sister   .  Heart disease Brother   . Prostate cancer Brother 46    same brother as throat cancer  . Throat cancer Brother     dx in his 34s; also a smoker  . Heart disease Father   . Cancer Maternal Grandmother 69    colon cancer or abdominal cancer  . Lung cancer Maternal Grandfather 61  . Breast cancer Maternal Aunt 59  . Colon cancer Maternal Aunt 30    same sister as breast at 61  . Lung cancer Maternal Uncle   . Breast cancer Maternal Aunt     dx in her 47s  . Pancreatic cancer Cousin 45    maternal first cousin  . Breast cancer Cousin     paternal first cousin twice removed died in her 2s  . Cardiomyopathy      Family history  . Heart attack Sister   . Hypertension Sister   . Hypertension Brother   . Stroke Neg Hx    Allergies  Allergen Reactions  . Ace Inhibitors Swelling    Angioedema; makes tongue "break out" Angioedema; makes tongue "break out"  . Atorvastatin Other (See Comments)    SEVERE MYALGIAS, MUSCLE PAIN SEVERE MYALGIAS, MUSCLE PAIN  . Ramipril Other (See Comments)    TONGUE ULCERS TONGUE ULCERS  . Rosuvastatin Other (See Comments)    MYALGIAS, SEVERE MUSCLE PAIN MYALGIAS, SEVERE MUSCLE PAIN  . Amitriptyline Hcl Other (See Comments)    Unknown  . Amitriptyline Hcl Other (See Comments)    Unknown  . Iodine-131 Other (See Comments)  . Paroxetine Other (See Comments)    UNKNOWN UNKNOWN  . Carbamazepine Rash    REACTION: ? reaction REACTION: ? reaction  . Cetirizine Hcl Other (See Comments)     REACTION: sleepy  . Cetirizine Hcl Other (See Comments)    REACTION: sleepy  . Codeine Itching  . Dilantin [Phenytoin Sodium Extended] Rash  . Phenytoin Sodium Extended Rash   Current Outpatient Prescriptions on File Prior to Visit  Medication Sig Dispense Refill  . acetaminophen (TYLENOL) 500 MG tablet Take 500 mg by mouth every 6 (six) hours as needed (pain).     [name] aspirin 81 MG tablet Take 81 mg by mouth daily.    . carisoprodol (SOMA) 350 MG tablet Take 1 tablet (350 mg total) by mouth 4 (four) times daily. 60 tablet 0  . cholecalciferol (VITAMIN D) 1000 UNITS tablet Take 2,000 Units by mouth daily.    . fexofenadine (ALLEGRA) 180 MG tablet Take 180 mg by mouth daily as needed for allergies or rhinitis.    . fluticasone (FLONASE) 50 MCG/ACT nasal spray Place 2 sprays into both nostrils daily as needed for allergies or rhinitis. 48 g 3  . hydrochlorothiazide (HYDRODIURIL) 25 MG tablet Take 1 tablet (25 mg total) by mouth daily. 90 tablet 3  . latanoprost (XALATAN) 0.005 % ophthalmic solution Place 1 drop into both eyes at bedtime.    [name] levothyroxine (SYNTHROID) 50 MCG tablet Take 1 tablet (50 mcg total) by mouth daily. 90 tablet 3  . metoprolol (LOPRESSOR) 50 MG tablet Take 1 tablet (50 mg total) by mouth daily as needed. 15 tablet 3  . Metoprolol Tartrate 75 MG TABS Take 75 mg by mouth 2 (two) times daily. 180 tablet 3  . Multiple Vitamins-Minerals (MULTIVITAMIN PO) Take 1 tablet by mouth daily.    . pantoprazole (PROTONIX) 40 MG tablet Take 1 tablet (40 mg total) by mouth 2 (two) times daily before a meal. 180 tablet  3  . polyethylene glycol powder (GLYCOLAX/MIRALAX) powder Take 17 grams by mouth daily 1080 g 0  . potassium chloride (K-DUR,KLOR-CON) 10 MEQ tablet TAKE 1/2 TABLET (5MEQ TOTAL) EVERY DAY 45 tablet 3  . simvastatin (ZOCOR) 40 MG tablet Take 0.5 tablets (20 mg total) by mouth daily. 45 tablet 3  . zolpidem (AMBIEN) 10 MG tablet TAKE 1 TABLET BY MOUTH AT BEDTIME AS NEEDED  FOR SLEEP 90 tablet 1  . montelukast (SINGULAIR) 10 MG tablet Take 1 tablet (10 mg total) by mouth at bedtime. (Patient not taking: Reported on 09/26/2016) 30 tablet 5  . [DISCONTINUED] metoprolol succinate (TOPROL-XL) 50 MG 24 hr tablet Take 2 tablets (100 mg total) by mouth daily. Take with or immediately following a meal. 90 tablet 3  . [DISCONTINUED] potassium chloride (K-DUR) 10 MEQ tablet Take 0.5 tablets (5 mEq total) by mouth daily. 1/2 tab po qd 45 tablet 1   No current facility-administered medications on file prior to visit.     Review of Systems Review of Systems  Constitutional: Negative for fever, appetite change, fatigue and unexpected weight change.  Eyes: Negative for pain and visual disturbance.  Respiratory: Negative for cough and shortness of breath.   Cardiovascular: Negative for cp or palpitations    Gastrointestinal: Negative for nausea, diarrhea and constipation.  Genitourinary: pos for frequency and urge incontinence, pos for vaginal d/c and odor and irritation, pos for pelvic pain  Skin: Negative for pallor or rash   Neurological: Negative for weakness, light-headedness, numbness and headaches.  Hematological: Negative for adenopathy. Does not bruise/bleed easily.  Psychiatric/Behavioral: Negative for dysphoric mood. The patient is not nervous/anxious.         Objective:   Physical Exam  Constitutional: She appears well-developed and well-nourished. No distress.  overwt and well appearing   HENT:  Head: Normocephalic and atraumatic.  Mouth/Throat: Oropharynx is clear and moist.  Eyes: Conjunctivae and EOM are normal. Pupils are equal, round, and reactive to light. Right eye exhibits no discharge. Left eye exhibits no discharge. No scleral icterus.  Neck: Normal range of motion. Neck supple.  Cardiovascular: Normal rate and regular rhythm.   Pulmonary/Chest: Effort normal and breath sounds normal. No respiratory distress. She has no wheezes. She has no rales.   Abdominal: Soft. Bowel sounds are normal. She exhibits no distension and no mass. There is no hepatosplenomegaly. There is tenderness in the suprapubic area. There is no rigidity, no rebound, no guarding, no CVA tenderness, no tenderness at McBurney's point and negative Murphy's sign.  Tender in R pelvic area w/o rebound or guarding or mass No CVA tenderness  Genitourinary: Vaginal discharge found.  Genitourinary Comments: Vaginal d/x present-pale yellow  Some odor No hyperemia or labia or lesions   Musculoskeletal: She exhibits no edema.  Lymphadenopathy:    She has no cervical adenopathy.  Neurological: She is alert. She has normal reflexes. No cranial nerve deficit. She exhibits normal muscle tone. Coordination normal.  Skin: Skin is warm and dry. No rash noted.  Psychiatric: She has a normal mood and affect.          Assessment & Plan:   Problem List Items Addressed This Visit      Genitourinary   Trichomonal vaginitis    This seems to be recurrent -though she denies sexual activity with that partner tx with flagyl 500 mg bid for 10 d Update if no imp       Relevant Medications   metroNIDAZOLE (FLAGYL) 500 MG tablet  Other   Breast cancer of upper-outer quadrant of right female breast ([place])    Continues to do well after treatment       Relevant Medications   metroNIDAZOLE (FLAGYL) 500 MG tablet   Screen for STD (sexually transmitted disease)    Gc/chlamydia probe sent      Relevant Orders   GC/Chlamydia Probe Amp (Completed)   Urinary frequency    Fairly bland ua  Sent for cx diag with vaginitis  Some urge incont- ? If overactive bladder as well  Pending results       Relevant Orders   Urine culture   POCT Urinalysis Dipstick (Automated) (Completed)   Vaginal discharge - Primary    Trichomonas on wt prep ? If recurrent  Not sexually active tx with flagyl 500 mg bid for 10 d Update if no imp  ? If this may cause her pelvic pain also  Will screen  for gc/chlamydia  Update if worse or no imp       Relevant Orders   GC/Chlamydia Probe Amp (Completed)   POCT Wet Prep [facility] (Completed)

## 2016-09-27 DIAGNOSIS — M19042 Primary osteoarthritis, left hand: Secondary | ICD-10-CM | POA: Insufficient documentation

## 2016-09-27 DIAGNOSIS — M170 Bilateral primary osteoarthritis of knee: Secondary | ICD-10-CM | POA: Insufficient documentation

## 2016-09-27 DIAGNOSIS — M19041 Primary osteoarthritis, right hand: Secondary | ICD-10-CM | POA: Insufficient documentation

## 2016-09-27 LAB — GC/CHLAMYDIA PROBE AMP
CT Probe RNA: NOT DETECTED
GC Probe RNA: NOT DETECTED

## 2016-09-27 LAB — POCT WET PREP (WET MOUNT): KOH Wet Prep POC: NEGATIVE

## 2016-09-27 NOTE — Assessment & Plan Note (Signed)
Continues to do well after treatment

## 2016-09-27 NOTE — Assessment & Plan Note (Signed)
Trichomonas on wt prep ? If recurrent  Not sexually active tx with flagyl 500 mg bid for 10 d Update if no imp  ? If this may cause her pelvic pain also  Will screen for gc/chlamydia  Update if worse or no imp

## 2016-09-27 NOTE — Assessment & Plan Note (Signed)
Gc/chlamydia probe sent

## 2016-09-27 NOTE — Assessment & Plan Note (Signed)
Fairly bland ua  Sent for cx diag with vaginitis  Some urge incont- ? If overactive bladder as well  Pending results

## 2016-09-27 NOTE — Assessment & Plan Note (Signed)
This seems to be recurrent -though she denies sexual activity with that partner tx with flagyl 500 mg bid for 10 d Update if no imp

## 2016-09-27 NOTE — Progress Notes (Addendum)
Office Visit Note  Patient: [name]             Date of Birth: 29-Aug-1950           MRN: [id number]             PCP: Loura Pardon, MD Referring: Tower, [name], MD Visit Date: 09/28/2016 Occupation: @GUAROCC @    Subjective:  Pain of the Middle Back; Pain of the Lower Back; and Follow-up Follow-up on fibromyalgia syndrome  History of Present Illness: [name] is a 66 y.o. female  Last seen 09/23/2015. Patient rates her fibromyalgia discomfort as 4-5 on a scale of 0-10. Patient is doing well with fibromyalgia.  Patient has a history of bilateral osteoarthritis of the knees. She saw Dr. Ronnie Derby and had surgery done to the left knee. Her left total knee replacement was June 2017 followed by physical therapy. She is doing really well with her left knee. She is scheduled to get her right knee done this year. They have not specified the date yet the patient wants to do it by summer of 2018. She is doing physical therapy ahead of time to get her right knee in the best shape possible prior to the surgery and then she'll religiously do physical therapy after the surgery.  Ongoing OA of the hands. Some stiffness and some pain.  Patient is getting very good benefit from Ambien for insomnia. She uses 10 mg regularly with good results. She is worried about the Flexeril that she is given and she knows that Flexeril can cause additional problems when taken with Ambien. I am agreeable with her assessment and told her to stop Flexeril. In its place I'm going to give her baclofen to use day and/or night. She can take up to one pill 3 times a day as needed. I'll give her 30 day supply with 5 refills and we discussed the proper way of using the medication. Patient is agreeable.    Activities of Daily Living:  Patient reports morning stiffness for 15 minutes.   Patient Denies nocturnal pain.  Difficulty dressing/grooming: Denies Difficulty climbing stairs: Denies Difficulty getting  out of chair: Denies Difficulty using hands for taps, buttons, cutlery, and/or writing: Denies   Review of Systems  Constitutional: Positive for fatigue.  HENT: Negative for mouth sores and mouth dryness.   Eyes: Negative for dryness.  Respiratory: Negative for shortness of breath.   Gastrointestinal: Negative for constipation and diarrhea.  Musculoskeletal: Positive for myalgias and myalgias.  Skin: Negative for sensitivity to sunlight.  Psychiatric/Behavioral: Positive for sleep disturbance. Negative for decreased concentration.    PMFS History:  Patient Active Problem List   Diagnosis Date Noted  . Primary osteoarthritis of both hands 09/27/2016  . Primary osteoarthritis of both knees 09/27/2016  . Urinary frequency 09/26/2016  . Screen for STD (sexually transmitted disease) 09/26/2016  . Nasopharyngitis, 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/2016  . Postnasal drip 05/18/2016  . S/P total knee replacement 02/13/2016  . Chronic pain of both knees 12/06/2015  . Globus pharyngeus 05/17/2015  . Productive cough 05/17/2015  . Genetic testing 12/23/2014  . Family history of breast cancer   . Family history of colon cancer   . Family history of pancreatic cancer   . Breast cancer of upper-outer quadrant of right female breast ([place]) 11/23/2014  . Vaginal discharge 10/19/2014  . Trichomonal vaginitis 10/19/2014  . Allergy to ACE inhibitors 09/27/2014  . Encounter for Medicare annual wellness exam 06/27/2014  . Hyperglycemia 06/23/2013  .  Routine general medical examination at a health care facility 06/23/2013  . Tongue lesion 02/17/2013  . Dizziness and giddiness 01/16/2013  . Disturbance of skin sensation 01/16/2013  . RLS (restless legs syndrome) 01/15/2013  . Hx of Clostridium difficile infection 10/10/2012  . Blood in stool 07/31/2012  . Bronchiectasis without acute exacerbation ([place]) 04/29/2012  . Dyspnea on exertion 04/02/2012  . Non-ischemic cardiomyopathy ([place]) 03/07/2012  . Colon cancer  screening 01/11/2012  . Post-menopausal 01/11/2012  . Chest pain 11/07/2011  . Palpitations 04/17/2011  . Abnormal tympanic membrane 01/22/2011  . Obstructive sleep apnea 04/04/2010  . VENTRICULAR TACHYCARDIA 01/03/2009  . ICD  [employer]  Single chamber 01/03/2009  . PATENT FORAMEN OVALE 09/29/2008  . Vitamin D deficiency 08/17/2008  . Hypothyroidism 11/18/2006  . Hyperlipidemia 11/18/2006  . DEPRESSION 11/18/2006  . Essential hypertension 11/18/2006  . MITRAL VALVE PROLAPSE 11/18/2006  . CEREBRAL ANEURYSM 11/18/2006  . ALLERGIC RHINITIS 11/18/2006  . GERD 11/18/2006  . DIVERTICULOSIS, COLON 11/18/2006  . Fatty liver 11/18/2006  . Fibromyalgia 11/18/2006    Past Medical History:  Diagnosis Date  . AICD (automatic cardioverter/defibrillator) present   . Anxiety   . Arthritis   . 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 Cerebral aneurysm, nonruptured   . Clostridium difficile infection   . Depressive disorder, not elsewhere classified   . Diverticulosis   . Diverticulosis of colon (without mention of hemorrhage)   . Esophageal reflux   . Fibromyalgia   . Gastritis   . GERD (gastroesophageal reflux disease)   . GI bleed   . Hiatal hernia   . Hiatal hernia   . History of hiatal hernia   . Hyperlipidemia   . Hypertension   . Hypothyroidism   . Internal hemorrhoids   . Obstructive sleep apnea (adult) (pediatric)   . Ostium secundum type atrial septal defect   . Other chronic nonalcoholic liver disease   . Other pulmonary embolism and infarction   . Paroxysmal ventricular tachycardia ([place])   . Pneumonia    history  . Presence of permanent cardiac pacemaker   . PUD (peptic ulcer disease)   . Radiation 02/03/15-03/10/15   Right Breast  . Sarcoid ([place])   . Stroke [facility]) 2013   tia  . Takotsubo syndrome   . Unspecified transient cerebral ischemia   . Unspecified vitamin D deficiency     Family History  Problem Relation Age of Onset  . Diabetes Mother   .  Alzheimer's disease Mother   . Other Brother     Thyroid problem  . Allergies Sister   . Heart disease Sister   . Heart disease Brother   . Prostate cancer Brother 19    same brother as throat cancer  . Throat cancer Brother     dx in his 16s; also a smoker  . Heart disease Father   . Cancer Maternal Grandmother 71    colon cancer or abdominal cancer  . Lung cancer Maternal Grandfather 68  . Breast cancer Maternal Aunt 57  . Colon cancer Maternal Aunt 39    same sister as breast at 67  . Lung cancer Maternal Uncle   . Breast cancer Maternal Aunt     dx in her 50s  . Pancreatic cancer Cousin 57    maternal first cousin  . Breast cancer Cousin     paternal first cousin twice removed died in her 84s  . Cardiomyopathy      Family history  .  Heart attack Sister   . Hypertension Sister   . Hypertension Brother   . Stroke Neg Hx    Past Surgical History:  Procedure Laterality Date  . ABDOMINAL HYSTERECTOMY    . ABI  2006   normal  . BREAST LUMPECTOMY WITH RADIOACTIVE SEED LOCALIZATION Right 01/03/2015   Procedure: BREAST LUMPECTOMY WITH RADIOACTIVE SEED LOCALIZATION;  Surgeon: Autumn Messing III, MD;  Location: [facility];  Service: General;  Laterality: Right;  . CARDIAC DEFIBRILLATOR PLACEMENT  2006; 2012   BSX single chamber ICD  . carotid dopplers  2006   neg  . CEREBRAL ANEURYSM REPAIR  02/1999  . hospitalization  2004   GI bleed, PUD, diverticulosis (EGD,colonscopy)  . hospitalization     PE, NSVT, s/p defib  . KNEE ARTHROSCOPY     bilateral  . LEFT HEART CATHETERIZATION WITH CORONARY ANGIOGRAM N/A 02/22/2012   Procedure: LEFT HEART CATHETERIZATION WITH CORONARY ANGIOGRAM;  Surgeon: Burnell Blanks, MD;  Location: [facility] CATH LAB;  Service: Cardiovascular;  Laterality: N/A;  . PARTIAL HYSTERECTOMY     Fibroids  . TOTAL KNEE ARTHROPLASTY Left 02/13/2016   Procedure: TOTAL KNEE ARTHROPLASTY;  Surgeon: Vickey Huger, MD;  Location: [facility];  Service: Orthopedics;  Laterality: Left;     Social History   Social History Narrative   Retired from [employer]      2 children      Lives alone      Divorced     Objective: Vital Signs: BP 132/80   Pulse 84   Resp 16   Ht 5' 6.5" (1.689 m)   Wt 208 lb (94.3 kg)   LMP  (LMP Unknown)   BMI 33.07 kg/m    Physical Exam  Constitutional: She is oriented to person, place, and time. She appears well-developed and well-nourished.  HENT:  Head: Normocephalic and atraumatic.  Eyes: EOM are normal. Pupils are equal, round, and reactive to light.  Cardiovascular: Normal rate, regular rhythm and normal heart sounds.  Exam reveals no gallop and no friction rub.   No murmur heard. Pulmonary/Chest: Effort normal and breath sounds normal. She has no wheezes. She has no rales.  Abdominal: Soft. Bowel sounds are normal. She exhibits no distension. There is no tenderness. There is no guarding. No hernia.  Musculoskeletal: Normal range of motion. She exhibits no edema, tenderness or deformity.  Lymphadenopathy:    She has no cervical adenopathy.  Neurological: She is alert and oriented to person, place, and time. Coordination normal.  Skin: Skin is warm and dry. Capillary refill takes less than 2 seconds. No rash noted.  Psychiatric: She has a normal mood and affect. Her behavior is normal.  Nursing note and vitals reviewed.    Musculoskeletal Exam:  Full range of motion of all joints Grip strength is equal and strong bilaterally Fibromyalgia tender points are 6 out of 18 positive with pain to bilateral trapezius, bilateral greater trochanter bursa, bilateral SI joint.  CDAI Exam: CDAI Homunculus Exam:   Joint Counts:  CDAI Tender Joint count: 0 CDAI Swollen Joint count: 0  Global Assessments:  Patient Global Assessment: 5 Provider Global Assessment: 5  CDAI Calculated Score: 10  No synovitis on examination  Investigation: Findings:  10/07/2014 X-rays of bilateral knee joints, 2 views today, showed bilateral severe  medial compartment narrowing, worse on the left than the right.  Patellofemoral narrowing without any chondrocalcinosis.  These findings were consistent with severe osteoarthritis of the knee joints.  Right hand x-ray was also  obtained, as she was having right hand discomfort. It showed all PIP and DIP narrowing, mild, and right CMC severe narrowing, consistent with osteoarthritis.  No MCP changes or erosive changes were noted.    Imaging: No results found.  Speciality Comments: No specialty comments available.    Procedures:  No procedures performed Allergies: Ace inhibitors; Atorvastatin; Ramipril; Rosuvastatin; Amitriptyline hcl; Amitriptyline hcl; Iodine-131; Paroxetine; Carbamazepine; Cetirizine hcl; Cetirizine hcl; Codeine; Dilantin [phenytoin sodium extended]; and Phenytoin sodium extended   Assessment / Plan:     Visit Diagnoses: Fibromyalgia  Primary insomnia  Chronic fatigue  Pain in both feet  Primary osteoarthritis of both hands  Primary osteoarthritis of both knees  History of hypertension  History of fatty infiltration of liver  History of gastroesophageal reflux (GERD)   Plan: #1: Fibromyalgia. Rated 4-5 on a scale of 0-10 with pain.  #2:Ongoing fatigue and insomnia. Gets good relief with Ambien and gets good sleep. Has stopped taking Flexeril because of potential interaction between Flexeril being a sleep aid and a muscle relaxer as well as Ambien being a sleep aid. I'm agreeable with the patient and with told her to stop the Flexeril. In its place, I'm prescribing Actos and 10 mg 1 by mouth 3 times a day when necessary daily supply with 5 refills. [facility]  #3: Patient has had a left total knee replacement through Dr. Ruel Favors office June 2017. She's done really well.  She wants her right knee replaced and she plans to have that done I summer of 2018 through Dr. Ruel Favors office  #4: Patient has some plantar fasciitis type pain in her  feet. I encouraged her to get inserts from [employer] on [employer]. Patient is agreeable.  #5:Patient will see Dr. Glori Bickers ad lib. our in the next few months for her annual physical exam. I advised the patient that she needs updated urine drug screen if she wants her tramadol filled. The last one was done in January 2017. She states that she will have it done at Dr. Marliss Coots office. She also have Dr. Leonia Reader her tramadol for her. I am agreeable.    Orders: No orders of the defined types were placed in this encounter.  Meds ordered this encounter  Medications  . baclofen (LIORESAL) 10 MG tablet    Sig: Take 1 tablet (10 mg total) by mouth 3 (three) times daily.    Dispense:  30 each    Refill:  5    Order Specific Question:   Supervising Provider    Answer:   Bo Merino [id number]    Face-to-face time spent with patient was 30 minutes. 50% of time was spent in counseling and coordination of care.  Follow-Up Instructions: Return in about 5 months (around 02/25/2017) for FMS, [place] rt knee.   Eliezer Lofts, PA-C  Note - This record has been created using [employer].  Chart creation errors have been sought, but may not always  have been located. Such creation errors do not reflect on  the standard of medical care.

## 2016-09-28 ENCOUNTER — Ambulatory Visit (INDEPENDENT_AMBULATORY_CARE_PROVIDER_SITE_OTHER): Payer: Medicare Other | Admitting: Rheumatology

## 2016-09-28 ENCOUNTER — Encounter: Payer: Self-pay | Admitting: Rheumatology

## 2016-09-28 VITALS — BP 132/80 | HR 84 | Resp 16 | Ht 66.5 in | Wt 208.0 lb

## 2016-09-28 DIAGNOSIS — M19042 Primary osteoarthritis, left hand: Secondary | ICD-10-CM

## 2016-09-28 DIAGNOSIS — R5382 Chronic fatigue, unspecified: Secondary | ICD-10-CM | POA: Diagnosis not present

## 2016-09-28 DIAGNOSIS — M19041 Primary osteoarthritis, right hand: Secondary | ICD-10-CM

## 2016-09-28 DIAGNOSIS — M79671 Pain in right foot: Secondary | ICD-10-CM | POA: Diagnosis not present

## 2016-09-28 DIAGNOSIS — M170 Bilateral primary osteoarthritis of knee: Secondary | ICD-10-CM

## 2016-09-28 DIAGNOSIS — F5101 Primary insomnia: Secondary | ICD-10-CM | POA: Diagnosis not present

## 2016-09-28 DIAGNOSIS — M797 Fibromyalgia: Secondary | ICD-10-CM

## 2016-09-28 DIAGNOSIS — Z8679 Personal history of other diseases of the circulatory system: Secondary | ICD-10-CM | POA: Diagnosis not present

## 2016-09-28 DIAGNOSIS — Z8719 Personal history of other diseases of the digestive system: Secondary | ICD-10-CM | POA: Diagnosis not present

## 2016-09-28 DIAGNOSIS — M79672 Pain in left foot: Secondary | ICD-10-CM

## 2016-09-28 LAB — URINE CULTURE: ORGANISM ID, BACTERIA: NO GROWTH

## 2016-09-28 MED ORDER — BACLOFEN 10 MG PO TABS: 10.0000 mg | ORAL_TABLET | Freq: Three times a day (TID) | ORAL | 5 refills | Status: DC

## 2016-10-05 ENCOUNTER — Other Ambulatory Visit: Payer: Self-pay | Admitting: Internal Medicine

## 2016-10-05 ENCOUNTER — Other Ambulatory Visit: Payer: Self-pay | Admitting: Family Medicine

## 2016-10-05 DIAGNOSIS — K2970 Gastritis, unspecified, without bleeding: Secondary | ICD-10-CM

## 2016-10-05 DIAGNOSIS — K3189 Other diseases of stomach and duodenum: Secondary | ICD-10-CM

## 2016-10-05 DIAGNOSIS — K31A0 Gastric intestinal metaplasia, unspecified: Secondary | ICD-10-CM

## 2016-10-17 ENCOUNTER — Telehealth: Payer: Self-pay | Admitting: Pulmonary Disease

## 2016-10-17 NOTE — Telephone Encounter (Signed)
LMTCB for [facility]

## 2016-10-18 NOTE — Telephone Encounter (Signed)
Spoke with Leafy Ro at [place]. She is aware of VS response. States that she can access this in Epic. Nothing further was needed.

## 2016-10-18 NOTE — Telephone Encounter (Signed)
Spoke with Mandy/[employer], needs an addendum to 05/15/16 OV notes stating that the patient failed previous CPAP therapy and why. Current notes are not detailed enough per insurance and they cannot approve to cover the restart until they have more information as to why it was failed previously.  Ex: multiple masks tried/failed, could not tolerate pressure, etc.   Please advise Dr Halford Chessman. Thanks.

## 2016-10-18 NOTE — Telephone Encounter (Signed)
This was documented in my note from 10/25/15.

## 2016-11-13 ENCOUNTER — Ambulatory Visit: Payer: Medicare Other | Admitting: Pulmonary Disease

## 2016-11-18 ENCOUNTER — Telehealth: Payer: Self-pay | Admitting: Family Medicine

## 2016-11-18 DIAGNOSIS — I10 Essential (primary) hypertension: Secondary | ICD-10-CM

## 2016-11-18 DIAGNOSIS — E7800 Pure hypercholesterolemia, unspecified: Secondary | ICD-10-CM

## 2016-11-18 DIAGNOSIS — E559 Vitamin D deficiency, unspecified: Secondary | ICD-10-CM

## 2016-11-18 DIAGNOSIS — R739 Hyperglycemia, unspecified: Secondary | ICD-10-CM

## 2016-11-18 DIAGNOSIS — E039 Hypothyroidism, unspecified: Secondary | ICD-10-CM

## 2016-11-18 NOTE — Telephone Encounter (Signed)
-----   Message from Ellamae Sia sent at 11/12/2016  2:40 PM EDT ----- Regarding: Lab orders for Monday, 4.9.18 Patient is scheduled for CPX labs, please order future labs, Thanks , Karna Christmas

## 2016-11-20 ENCOUNTER — Other Ambulatory Visit: Payer: Self-pay

## 2016-11-20 ENCOUNTER — Ambulatory Visit (INDEPENDENT_AMBULATORY_CARE_PROVIDER_SITE_OTHER)
Admission: RE | Admit: 2016-11-20 | Discharge: 2016-11-20 | Disposition: A | Discharge status: Home / Self Care | Payer: Medicare Other | Source: Ambulatory Visit | Attending: Adult Health | Admitting: Adult Health

## 2016-11-20 ENCOUNTER — Encounter: Payer: Self-pay | Admitting: Adult Health

## 2016-11-20 ENCOUNTER — Ambulatory Visit (INDEPENDENT_AMBULATORY_CARE_PROVIDER_SITE_OTHER): Payer: Medicare Other | Admitting: Adult Health

## 2016-11-20 VITALS — BP 124/72 | HR 65 | Ht 66.5 in | Wt 211.0 lb

## 2016-11-20 DIAGNOSIS — R0609 Other forms of dyspnea: Secondary | ICD-10-CM | POA: Diagnosis not present

## 2016-11-20 DIAGNOSIS — J479 Bronchiectasis, uncomplicated: Secondary | ICD-10-CM

## 2016-11-20 DIAGNOSIS — G4733 Obstructive sleep apnea (adult) (pediatric): Secondary | ICD-10-CM

## 2016-11-20 DIAGNOSIS — J301 Allergic rhinitis due to pollen: Secondary | ICD-10-CM | POA: Diagnosis not present

## 2016-11-20 MED ORDER — MONTELUKAST SODIUM 10 MG PO TABS: 10.0000 mg | ORAL_TABLET | Freq: Every day | ORAL | 3 refills | Status: DC

## 2016-11-20 NOTE — Assessment & Plan Note (Signed)
Dyspnea ? Etiology ,  Check cxr today  Check PFT on return

## 2016-11-20 NOTE — Progress Notes (Signed)
@Patient  ID: [name], female    DOB: 22-Sep-1950, 66 y.o.   MRN: [id number]  Chief Complaint  Patient presents with  . Follow-up    OSA     Referring provider: Abner Greenspan, MD  HPI: 66 yo female former smoker  followed for Bronchiectasis and OSA   TEST  Sleep tests PSG 11/29/08 >> AHI 9 HST 11/10/15 >> AHI 7.1, SaO2 low 75% Auto CPAP 08/12/16 to 09/10/16 >> used on 29 of 30 nights with average 4 hrs 34 min.  Average AHI 0.7 with median CPAP 7 and 95 th percentile CPAP 9 cm H2O  Pulmonary tests CT chest 04/10/12 >> b/l lower lobe cylindrical BTX and some in upper lobes PFT 04/17/12 >> FEV1 2.60 (110%), FEV1% 85, TLC 4.05 (77%), DLCO 44% CT angio chest 10/26/15 >> patchy GGO in periphery of lungs b/l, basilar BTX, no PE; no significant change compared to 2013  Cardiac tests Echo 08/24/16 >> EF 55 to 60%, grade 1 DD, mild/mod TR  11/20/2016 Follow up : OSA  Patient presents for a two-month period. Patient has underlying sleep apnea. Her C Pap settings were decreased to a set pressure of 7 cm H2O last visit. She been having some mouth dryness. She says she is doing well on the new settings. Download shows excellent compliance. She is using on average of 6.5 hours each night. AHI 0.8. Minimum leaks. She says she is doing well . Feels rested.   Patient had been having increased cough and nasal drainage. Singulair  was added to her regimen.  Feels this has helped. She remains on Flonase and Allegra.  No flare of cough . But does feel her breath is not as good still , gets winded at times. Hard to predict when it comes on. No associated chest pain.    Allergies  Allergen Reactions  . Ace Inhibitors Swelling    Angioedema; makes tongue "break out" Angioedema; makes tongue "break out"  . Atorvastatin Other (See Comments)    SEVERE MYALGIAS, MUSCLE PAIN SEVERE MYALGIAS, MUSCLE PAIN  . Ramipril Other (See Comments)    TONGUE ULCERS TONGUE ULCERS  . Rosuvastatin Other (See  Comments)    MYALGIAS, SEVERE MUSCLE PAIN MYALGIAS, SEVERE MUSCLE PAIN  . Amitriptyline Hcl Other (See Comments)    Unknown  . Amitriptyline Hcl Other (See Comments)    Unknown  . Iodine-131 Other (See Comments)  . Paroxetine Other (See Comments)    UNKNOWN UNKNOWN  . Carbamazepine Rash    REACTION: ? reaction REACTION: ? reaction  . Cetirizine Hcl Other (See Comments)    REACTION: sleepy  . Cetirizine Hcl Other (See Comments)    REACTION: sleepy  . Codeine Itching  . Dilantin [Phenytoin Sodium Extended] Rash  . Phenytoin Sodium Extended Rash    Immunization History  Administered Date(s) Administered  . H1N1 07/24/2008  . Influenza Split 07/17/2011, 09/27/2011, 04/29/2012  . Influenza Whole 07/20/2004, 07/04/2007, 05/07/2008, 05/19/2009, 05/17/2010  . Influenza,inj,Quad PF,36+ Mos 05/20/2013, 05/06/2014, 05/17/2015, 05/15/2016  . Pneumococcal Conjugate-13 11/22/2015  . Pneumococcal Polysaccharide-23 05/20/2002, 05/07/2008, 06/25/2014  . Td 09/07/2009  . Zoster 09/21/2011    Past Medical History:  Diagnosis Date  . AICD (automatic cardioverter/defibrillator) present   . Anxiety   . Arthritis   . 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 Cerebral aneurysm, nonruptured   . Clostridium difficile infection   . Depressive disorder, not elsewhere classified   . Diverticulosis   . Diverticulosis of colon (  without mention of hemorrhage)   . Esophageal reflux   . Fibromyalgia   . Gastritis   . GERD (gastroesophageal reflux disease)   . GI bleed   . Hiatal hernia   . Hiatal hernia   . History of hiatal hernia   . Hyperlipidemia   . Hypertension   . Hypothyroidism   . Internal hemorrhoids   . Obstructive sleep apnea (adult) (pediatric)   . Ostium secundum type atrial septal defect   . Other chronic nonalcoholic liver disease   . Other pulmonary embolism and infarction   . Paroxysmal ventricular tachycardia ([place])   . Pneumonia    history  . Presence of permanent  cardiac pacemaker   . PUD (peptic ulcer disease)   . Radiation 02/03/15-03/10/15   Right Breast  . Sarcoid ([place])   . Stroke [facility]) 2013   tia  . Takotsubo syndrome   . Unspecified transient cerebral ischemia   . Unspecified vitamin D deficiency     Tobacco History: History  Smoking Status  . Former Smoker  . Packs/day: 1.00  . Years: 15.00  . Types: Cigarettes  . Quit date: 08/20/1989  Smokeless Tobacco  . Never Used    Comment: Started at 82; less than 1 PPD   Counseling given: Not Answered   Outpatient Encounter Prescriptions as of 11/20/2016  Medication Sig  . acetaminophen (TYLENOL) 500 MG tablet Take 500 mg by mouth every 6 (six) hours as needed (pain).   [name] aspirin 81 MG tablet Take 81 mg by mouth daily.  . baclofen (LIORESAL) 10 MG tablet Take 1 tablet (10 mg total) by mouth 3 (three) times daily.  . cholecalciferol (VITAMIN D) 1000 UNITS tablet Take 2,000 Units by mouth daily.  . fexofenadine (ALLEGRA) 180 MG tablet Take 180 mg by mouth daily as needed for allergies or rhinitis.  . fluticasone (FLONASE) 50 MCG/ACT nasal spray Place 2 sprays into both nostrils daily as needed for allergies or rhinitis.  . hydrochlorothiazide (HYDRODIURIL) 25 MG tablet Take 1 tablet (25 mg total) by mouth daily.  [name] latanoprost (XALATAN) 0.005 % ophthalmic solution Place 1 drop into both eyes at bedtime.  [name] levothyroxine (SYNTHROID, LEVOTHROID) 50 MCG tablet TAKE 1 TABLET (50 MCG TOTAL) BY MOUTH DAILY.  . metoprolol (LOPRESSOR) 50 MG tablet Take 1 tablet (50 mg total) by mouth daily as needed.  . Metoprolol Tartrate 75 MG TABS Take 75 mg by mouth 2 (two) times daily.  . metroNIDAZOLE (FLAGYL) 500 MG tablet Take 1 tablet (500 mg total) by mouth 2 (two) times daily.  . montelukast (SINGULAIR) 10 MG tablet Take 1 tablet (10 mg total) by mouth at bedtime.  . Multiple Vitamins-Minerals (MULTIVITAMIN PO) Take 1 tablet by mouth daily.  . pantoprazole (PROTONIX) 40 MG tablet Take 1 tablet (40 mg  total) by mouth 2 (two) times daily before a meal.  . polyethylene glycol powder (GLYCOLAX/MIRALAX) powder Take 17 grams by mouth daily  . potassium chloride (K-DUR,KLOR-CON) 10 MEQ tablet TAKE 1/2 TABLET (5MEQ TOTAL) EVERY DAY  . simvastatin (ZOCOR) 40 MG tablet Take 0.5 tablets (20 mg total) by mouth daily.  [name] zolpidem (AMBIEN) 10 MG tablet TAKE 1 TABLET BY MOUTH AT BEDTIME AS NEEDED FOR SLEEP   No facility-administered encounter medications on file as of 11/20/2016.      Review of Systems  Constitutional:   No  weight loss, night sweats,  Fevers, chills, + fatigue, or  lassitude.  HEENT:   No headaches,  Difficulty swallowing,  Tooth/dental problems, or  Sore throat,                No sneezing, itching, ear ache, nasal congestion, post nasal drip,   CV:  No chest pain,  Orthopnea, PND, swelling in lower extremities, anasarca, dizziness, palpitations, syncope.   GI  No heartburn, indigestion, abdominal pain, nausea, vomiting, diarrhea, change in bowel habits, loss of appetite, bloody stools.   Resp: .  No chest wall deformity  Skin: no rash or lesions.  GU: no dysuria, change in color of urine, no urgency or frequency.  No flank pain, no hematuria   MS:  No joint pain or swelling.  No decreased range of motion.  No back pain.    Physical Exam  BP 124/72 (BP Location: Right Arm, Cuff Size: Normal)   Pulse 65   Ht 5' 6.5" (1.689 m)   Wt 211 lb (95.7 kg)   LMP  (LMP Unknown)   SpO2 98%   BMI 33.55 kg/m   GEN: A/Ox3; pleasant , NAD, obese    HEENT:  [place]/AT,  EACs-clear, TMs-wnl, NOSE-clear, THROAT-clear, no lesions, no postnasal drip or exudate noted. Class 2 MP airway   NECK:  Supple w/ fair ROM; no JVD; normal carotid impulses w/o bruits; no thyromegaly or nodules palpated; no lymphadenopathy.    RESP  Clear  P & A; w/o, wheezes/ rales/ or rhonchi. no accessory muscle use, no dullness to percussion  CARD:  RRR, no m/r/g, no peripheral edema, pulses intact, no cyanosis  or clubbing.  GI:   Soft & nt; nml bowel sounds; no organomegaly or masses detected.   Musco: Warm bil, no deformities or joint swelling noted.   Neuro: alert, no focal deficits noted.    Skin: Warm, no lesions or rashes    Lab Results:  CBC    Component Value Date/Time   WBC 4.7 06/14/2016 1520   RBC 4.53 06/14/2016 1520   HGB 13.1 06/14/2016 1520   HGB 12.4 12/01/2014 0845   HCT 40.3 06/14/2016 1520   HCT 38.8 12/01/2014 0845   PLT 223 06/14/2016 1520   PLT 213 12/01/2014 0845   MCV 89.0 06/14/2016 1520   MCV 88.5 12/01/2014 0845   MCH 28.9 06/14/2016 1520   MCHC 32.5 06/14/2016 1520   RDW 13.4 06/14/2016 1520   RDW 12.7 12/01/2014 0845   LYMPHSABS 1,222 06/14/2016 1520   LYMPHSABS 1.0 12/01/2014 0845   MONOABS 470 06/14/2016 1520   MONOABS 0.3 12/01/2014 0845   EOSABS 235 06/14/2016 1520   EOSABS 0.3 12/01/2014 0845   EOSABS 0.1 03/13/2007 1249   BASOSABS 47 06/14/2016 1520   BASOSABS 0.0 12/01/2014 0845    BMET    Component Value Date/Time   NA 140 08/16/2016 1452   NA 141 12/01/2014 0847   K 4.2 08/16/2016 1452   K 3.8 12/01/2014 0847   CL 103 08/16/2016 1452   CO2 29 08/16/2016 1452   CO2 26 12/01/2014 0847   GLUCOSE 97 08/16/2016 1452   GLUCOSE 175 (H) 12/01/2014 0847   BUN 12 08/16/2016 1452   BUN 10.6 12/01/2014 0847   CREATININE 0.70 08/16/2016 1452   CREATININE 0.8 12/01/2014 0847   CALCIUM 10.1 08/16/2016 1452   CALCIUM 9.8 12/01/2014 0847   GFRNONAA >60 02/14/2016 0618   GFRAA >60 02/14/2016 0618    BNP    Component Value Date/Time   BNP 30.2 08/16/2016 1452    ProBNP No results found for: PROBNP  Imaging: No results found.  Assessment & Plan:   Obstructive sleep apnea Well controlled .   Plan  Cont on CPAP At bedtime  .   Dyspnea on exertion Dyspnea ? Etiology ,  Check cxr today  Check PFT on return    Allergic rhinitis Improved control on Singulair   Plan  Cont on current regimen      Rexene Edison,  NP 11/20/2016

## 2016-11-20 NOTE — Patient Instructions (Signed)
Continue on CPAP At bedtime   Work on weight as able.  Do not drive if sleepy .  Continue on Singulair, Flonase , Allegra.  Chest xray today  follow up Dr. Halford Chessman  In 6 weeks with PFT and As needed   Please contact office for sooner follow up if symptoms do not improve or worsen or seek emergency care

## 2016-11-20 NOTE — Assessment & Plan Note (Addendum)
Improved control on Singulair   Plan  Cont on current regimen

## 2016-11-20 NOTE — Assessment & Plan Note (Signed)
Well controlled   Plan  Cont on CPAP At bedtime    

## 2016-11-20 NOTE — Addendum Note (Signed)
Addended by: Parke Poisson E on: 11/20/2016 12:04 PM   Modules accepted: Orders

## 2016-11-22 ENCOUNTER — Other Ambulatory Visit: Payer: Self-pay | Admitting: *Deleted

## 2016-11-22 MED ORDER — METOPROLOL TARTRATE 75 MG PO TABS
75.0000 mg | ORAL_TABLET | Freq: Two times a day (BID) | ORAL | 3 refills | Status: DC
Start: 2016-11-22 — End: 2017-02-15

## 2016-11-25 NOTE — Progress Notes (Signed)
I have reviewed and agree with assessment/plan.  Chesley Mires, MD [facility] Pulmonary/Critical Care 11/25/2016, 11:38 AM Pager:  [phone]

## 2016-11-26 ENCOUNTER — Ambulatory Visit (INDEPENDENT_AMBULATORY_CARE_PROVIDER_SITE_OTHER): Payer: Medicare Other

## 2016-11-26 ENCOUNTER — Other Ambulatory Visit: Payer: Medicare Other

## 2016-11-26 VITALS — BP 118/70 | HR 58 | Temp 98.5°F | Ht 67.0 in | Wt 211.5 lb

## 2016-11-26 DIAGNOSIS — E7800 Pure hypercholesterolemia, unspecified: Secondary | ICD-10-CM

## 2016-11-26 DIAGNOSIS — E039 Hypothyroidism, unspecified: Secondary | ICD-10-CM | POA: Diagnosis not present

## 2016-11-26 DIAGNOSIS — R739 Hyperglycemia, unspecified: Secondary | ICD-10-CM

## 2016-11-26 DIAGNOSIS — I10 Essential (primary) hypertension: Secondary | ICD-10-CM

## 2016-11-26 DIAGNOSIS — Z0000 Encounter for general adult medical examination without abnormal findings: Secondary | ICD-10-CM

## 2016-11-26 DIAGNOSIS — E559 Vitamin D deficiency, unspecified: Secondary | ICD-10-CM

## 2016-11-26 LAB — COMPREHENSIVE METABOLIC PANEL
ALBUMIN: 4 g/dL (ref 3.5–5.2)
ALK PHOS: 112 U/L (ref 39–117)
ALT: 27 U/L (ref 0–35)
AST: 25 U/L (ref 0–37)
BILIRUBIN TOTAL: 0.4 mg/dL (ref 0.2–1.2)
BUN: 10 mg/dL (ref 6–23)
CO2: 31 mEq/L (ref 19–32)
CREATININE: 0.84 mg/dL (ref 0.40–1.20)
Calcium: 9.9 mg/dL (ref 8.4–10.5)
Chloride: 105 mEq/L (ref 96–112)
GFR: 86.67 mL/min (ref >60.00)
Glucose, Bld: 105 mg/dL — ABNORMAL HIGH (ref 70–99)
Potassium: 3.9 mEq/L (ref 3.5–5.1)
Sodium: 140 mEq/L (ref 135–145)
TOTAL PROTEIN: 7.4 g/dL (ref 6.0–8.3)

## 2016-11-26 LAB — LIPID PANEL
CHOLESTEROL: 172 mg/dL (ref 0–200)
HDL: 48.5 mg/dL (ref >39.00)
LDL CALC: 96 mg/dL (ref 0–99)
NonHDL: 123.37
Total CHOL/HDL Ratio: 4
Triglycerides: 135 mg/dL (ref 0.0–149.0)
VLDL: 27 mg/dL (ref 0.0–40.0)

## 2016-11-26 LAB — CBC WITH DIFFERENTIAL/PLATELET
Basophils Absolute: 0.1 10*3/uL (ref 0.0–0.1)
Basophils Relative: 1.3 % (ref 0.0–3.0)
EOS ABS: 0.2 10*3/uL (ref 0.0–0.7)
Eosinophils Relative: 5.3 % — ABNORMAL HIGH (ref 0.0–5.0)
HEMATOCRIT: 39.6 % (ref 36.0–46.0)
Hemoglobin: 13 g/dL (ref 12.0–15.0)
LYMPHS PCT: 23.9 % (ref 12.0–46.0)
Lymphs Abs: 0.9 10*3/uL (ref 0.7–4.0)
MCHC: 32.7 g/dL (ref 30.0–36.0)
MCV: 89.6 fl (ref 78.0–100.0)
Monocytes Absolute: 0.4 10*3/uL (ref 0.1–1.0)
Monocytes Relative: 9.9 % (ref 3.0–12.0)
NEUTROS ABS: 2.4 10*3/uL (ref 1.4–7.7)
Neutrophils Relative %: 59.6 % (ref 43.0–77.0)
PLATELETS: 213 10*3/uL (ref 150.0–400.0)
RBC: 4.42 Mil/uL (ref 3.87–5.11)
RDW: 12.8 % (ref 11.5–15.5)
WBC: 4 10*3/uL (ref 4.0–10.5)

## 2016-11-26 LAB — HEMOGLOBIN A1C: Hgb A1c MFr Bld: 5.5 % (ref 4.6–6.5)

## 2016-11-26 LAB — VITAMIN D 25 HYDROXY (VIT D DEFICIENCY, FRACTURES): VITD: 44.83 ng/mL (ref 30.00–100.00)

## 2016-11-26 LAB — TSH: TSH: 2.71 u[IU]/mL (ref 0.35–4.50)

## 2016-11-26 NOTE — Progress Notes (Signed)
Pre visit review using our clinic review tool, if applicable. No additional management support is needed unless otherwise documented below in the visit note. 

## 2016-11-26 NOTE — Progress Notes (Signed)
PCP notes:   Health maintenance:  Mammogram - addressed; pt encouraged to schedule future appt  Abnormal screenings:   None  Patient concerns:   Pt has concerns with pain in left shoulder, right knee, and right foot. Pain scale: 5/10 in each area.   Nurse concerns:  None  Next PCP appt:   11/30/16 @ 0930  I reviewed health advisor's note, was available for consultation, and agree with documentation and plan. Loura Pardon MD

## 2016-11-26 NOTE — Progress Notes (Signed)
Subjective:   [name] is a 66 y.o. female who presents for Medicare Annual (Subsequent) preventive examination.  Review of Systems:  N/A Cardiac Risk Factors include: advanced age (>51men, >6 women);obesity (BMI >30kg/m2);dyslipidemia;hypertension     Objective:     Vitals: BP 118/70 (BP Location: Right Arm, Patient Position: Sitting, Cuff Size: Normal)   Pulse (!) 58   Temp 98.5 F (36.9 C) (Oral)   Ht 5\' 7"  (1.702 m)   Wt 211 lb 8 oz (95.9 kg)   LMP  (LMP Unknown)   SpO2 98%   BMI 33.13 kg/m   Body mass index is 33.13 kg/m.   Tobacco History  Smoking Status  . Former Smoker  . Packs/day: 1.00  . Years: 15.00  . Types: Cigarettes  . Quit date: 08/20/1989  Smokeless Tobacco  . Never Used    Comment: Started at 57; less than 1 PPD     Counseling given: No   Past Medical History:  Diagnosis Date  . AICD (automatic cardioverter/defibrillator) present   . Anxiety   . Arthritis   . 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 Cerebral aneurysm, nonruptured   . Clostridium difficile infection   . Depressive disorder, not elsewhere classified   . Diverticulosis   . Diverticulosis of colon (without mention of hemorrhage)   . Esophageal reflux   . Fibromyalgia   . Gastritis   . GERD (gastroesophageal reflux disease)   . GI bleed   . Hiatal hernia   . Hiatal hernia   . History of hiatal hernia   . Hyperlipidemia   . Hypertension   . Hypothyroidism   . Internal hemorrhoids   . Obstructive sleep apnea (adult) (pediatric)   . Ostium secundum type atrial septal defect   . Other chronic nonalcoholic liver disease   . Other pulmonary embolism and infarction   . Paroxysmal ventricular tachycardia ([place])   . Pneumonia    history  . Presence of permanent cardiac pacemaker   . PUD (peptic ulcer disease)   . Radiation 02/03/15-03/10/15   Right Breast  . Sarcoid ([place])   . Stroke [facility]) 2013   tia  . Takotsubo syndrome   . Unspecified transient cerebral  ischemia   . Unspecified vitamin D deficiency    Past Surgical History:  Procedure Laterality Date  . ABDOMINAL HYSTERECTOMY    . ABI  2006   normal  . BREAST LUMPECTOMY WITH RADIOACTIVE SEED LOCALIZATION Right 01/03/2015   Procedure: BREAST LUMPECTOMY WITH RADIOACTIVE SEED LOCALIZATION;  Surgeon: Autumn Messing III, MD;  Location: [facility];  Service: General;  Laterality: Right;  . CARDIAC DEFIBRILLATOR PLACEMENT  2006; 2012   BSX single chamber ICD  . carotid dopplers  2006   neg  . CEREBRAL ANEURYSM REPAIR  02/1999  . hospitalization  2004   GI bleed, PUD, diverticulosis (EGD,colonscopy)  . hospitalization     PE, NSVT, s/p defib  . KNEE ARTHROSCOPY     bilateral  . LEFT HEART CATHETERIZATION WITH CORONARY ANGIOGRAM N/A 02/22/2012   Procedure: LEFT HEART CATHETERIZATION WITH CORONARY ANGIOGRAM;  Surgeon: Burnell Blanks, MD;  Location: [facility] CATH LAB;  Service: Cardiovascular;  Laterality: N/A;  . PARTIAL HYSTERECTOMY     Fibroids  . TOTAL KNEE ARTHROPLASTY Left 02/13/2016   Procedure: TOTAL KNEE ARTHROPLASTY;  Surgeon: Vickey Huger, MD;  Location: [facility];  Service: Orthopedics;  Laterality: Left;   Family History  Problem Relation Age of Onset  . Diabetes Mother   .  Alzheimer's disease Mother   . Other Brother     Thyroid problem  . Allergies Sister   . Heart disease Sister   . Heart disease Brother   . Prostate cancer Brother 66    same brother as throat cancer  . Throat cancer Brother     dx in his 68s; also a smoker  . Heart disease Father   . Cancer Maternal Grandmother 51    colon cancer or abdominal cancer  . Lung cancer Maternal Grandfather 7  . Breast cancer Maternal Aunt 51  . Colon cancer Maternal Aunt 15    same sister as breast at 20  . Lung cancer Maternal Uncle   . Breast cancer Maternal Aunt     dx in her 30s  . Pancreatic cancer Cousin 73    maternal first cousin  . Breast cancer Cousin     paternal first cousin twice removed died in her 29s  .  Cardiomyopathy      Family history  . Heart attack Sister   . Hypertension Sister   . Hypertension Brother   . Stroke Neg Hx    History  Sexual Activity  . Sexual activity: No    Outpatient Encounter Prescriptions as of 11/26/2016  Medication Sig  . acetaminophen (TYLENOL) 500 MG tablet Take 500 mg by mouth every 6 (six) hours as needed (pain).   [name] aspirin 81 MG tablet Take 81 mg by mouth daily.  . baclofen (LIORESAL) 10 MG tablet Take 1 tablet (10 mg total) by mouth 3 (three) times daily.  . cholecalciferol (VITAMIN D) 1000 UNITS tablet Take 2,000 Units by mouth daily.  . fexofenadine (ALLEGRA) 180 MG tablet Take 180 mg by mouth daily as needed for allergies or rhinitis.  . fluticasone (FLONASE) 50 MCG/ACT nasal spray Place 2 sprays into both nostrils daily as needed for allergies or rhinitis.  . hydrochlorothiazide (HYDRODIURIL) 25 MG tablet Take 1 tablet (25 mg total) by mouth daily.  [name] latanoprost (XALATAN) 0.005 % ophthalmic solution Place 1 drop into both eyes at bedtime.  [name] levothyroxine (SYNTHROID, LEVOTHROID) 50 MCG tablet TAKE 1 TABLET (50 MCG TOTAL) BY MOUTH DAILY.  . metoprolol (LOPRESSOR) 50 MG tablet Take 1 tablet (50 mg total) by mouth daily as needed.  . Metoprolol Tartrate 75 MG TABS Take 75 mg by mouth 2 (two) times daily.  . montelukast (SINGULAIR) 10 MG tablet Take 1 tablet (10 mg total) by mouth at bedtime.  . Multiple Vitamins-Minerals (MULTIVITAMIN PO) Take 1 tablet by mouth daily.  . pantoprazole (PROTONIX) 40 MG tablet Take 1 tablet (40 mg total) by mouth 2 (two) times daily before a meal.  . polyethylene glycol powder (GLYCOLAX/MIRALAX) powder Take 17 grams by mouth daily  . potassium chloride (K-DUR,KLOR-CON) 10 MEQ tablet TAKE 1/2 TABLET (5MEQ TOTAL) EVERY DAY  . simvastatin (ZOCOR) 40 MG tablet Take 0.5 tablets (20 mg total) by mouth daily.  [name] zolpidem (AMBIEN) 10 MG tablet TAKE 1 TABLET BY MOUTH AT BEDTIME AS NEEDED FOR SLEEP  . [DISCONTINUED] metroNIDAZOLE  (FLAGYL) 500 MG tablet Take 1 tablet (500 mg total) by mouth 2 (two) times daily.   No facility-administered encounter medications on file as of 11/26/2016.     Activities of Daily Living In your present state of health, do you have any difficulty performing the following activities: 11/26/2016 02/03/2016  Hearing? N -  Vision? Y -  Difficulty concentrating or making decisions? Y -  Walking or climbing stairs? Aggie Moats  Dressing or bathing? N -  Doing errands, shopping? N -  Preparing Food and eating ? N -  Using the Toilet? N -  In the past six months, have you accidently leaked urine? Y -  Do you have problems with loss of bowel control? N -  Managing your Medications? N -  Managing your Finances? N -  Housekeeping or managing your Housekeeping? N -  Some recent data might be hidden    Patient Care Team: Abner Greenspan, MD as PCP - Neihart III, MD as Consulting Physician (General Surgery) Truitt Merle, MD as Consulting Physician (Hematology) Thea Silversmith, MD (Inactive) as Consulting Physician (Radiation Oncology) Rockwell Germany, RN as Registered Nurse Mauro Kaufmann, RN as Registered Nurse Deboraha Sprang, MD as Consulting Physician (Cardiology) Holley Bouche, NP as Nurse Practitioner (Nurse Practitioner) Sylvan Cheese, NP as Nurse Practitioner (Nurse Practitioner) Chesley Mires, MD as Consulting Physician (Pulmonary Disease) Autumn Messing III, MD as Consulting Physician (General Surgery) Bo Merino, MD as Consulting Physician (Rheumatology)    Assessment:     Hearing Screening   125Hz  250Hz  500Hz  1000Hz  2000Hz  3000Hz  4000Hz  6000Hz  8000Hz   Right ear:   40 40 40  40    Left ear:   40 40 40  40    Vision Screening Comments: Last vision exam in 2017 with Dr. Einar Gip   Exercise Activities and Dietary recommendations Current Exercise Habits: The patient does not participate in regular exercise at present, Exercise limited by: None identified  Goals    .  Weight < 176 lb (79.833 kg)          Starting 11/26/16, I will attempt to reduce all added sugar in diet and increase exercise for up to 90 min 3 days per week.       Fall Risk Fall Risk  11/26/2016 11/22/2015 01/19/2015 06/25/2014  Falls in the past year? No Yes Yes Yes  Number falls in past yr: - 1 1 1   Injury with Fall? - Yes No No  Risk for fall due to : - Impaired balance/gait;Impaired mobility Other (Comment) -  Risk for fall due to (comments): - - running fro wasp, tripped and fell in yard -  Follow up - Falls evaluation completed;Education provided Falls prevention discussed -   Depression Screen PHQ 2/9 Scores 11/26/2016 11/22/2015 01/19/2015 06/25/2014  PHQ - 2 Score 0 0 0 0     Cognitive Function MMSE - Mini Mental State Exam 11/26/2016 11/22/2015  Orientation to time 5 5  Orientation to Place 5 5  Registration 3 3  Attention/ Calculation 0 0  Recall 3 3  Language- name 2 objects 0 0  Language- repeat 1 1  Language- follow 3 step command 3 3  Language- read & follow direction 0 0  Write a sentence 0 0  Copy design 0 0  Total score 20 20       PLEASE NOTE: A Mini-Cog screen was completed. Maximum score is 20. A value of 0 denotes this part of [place] MMSE was not completed or the patient failed this part of the Mini-Cog screening.   Mini-Cog Screening Orientation to Time - Max 5 pts Orientation to Place - Max 5 pts Registration - Max 3 pts Recall - Max 3 pts Language Repeat - Max 1 pts Language Follow 3 Step Command - Max 3 pts   Immunization History  Administered Date(s) Administered  . H1N1 07/24/2008  . Influenza Split 07/17/2011, 09/27/2011, 04/29/2012  .  Influenza Whole 07/20/2004, 07/04/2007, 05/07/2008, 05/19/2009, 05/17/2010  . Influenza,inj,Quad PF,36+ Mos 05/20/2013, 05/06/2014, 05/17/2015, 05/15/2016  . Pneumococcal Conjugate-13 11/22/2015  . Pneumococcal Polysaccharide-23 05/20/2002, 05/07/2008, 06/25/2014  . Td 09/07/2009  . Zoster 09/21/2011   Screening  Tests Health Maintenance  Topic Date Due  . MAMMOGRAM  11/20/2017 (Originally 11/21/2016)  . INFLUENZA VACCINE  03/20/2017  . COLONOSCOPY  11/20/2017  . TETANUS/TDAP  09/08/2019  . DEXA SCAN  Completed  . Hepatitis C Screening  Completed  . PNA vac Low Risk Adult  Completed      Plan:     I have personally reviewed and addressed the Medicare Annual Wellness questionnaire and have noted the following in the patient's chart:  A. Medical and social history B. Use of alcohol, tobacco or illicit drugs  C. Current medications and supplements D. Functional ability and status E.  Nutritional status F.  Physical activity G. Advance directives H. List of other physicians I.  Hospitalizations, surgeries, and ER visits in previous 12 months J.  [place] to include hearing, vision, cognitive, depression L. Referrals and appointments - none  In addition, I have reviewed and discussed with patient certain preventive protocols, quality metrics, and best practice recommendations. A written personalized care plan for preventive services as [name] as general preventive health recommendations were provided to patient.  See attached scanned questionnaire for additional information.   Signed,   Lindell Noe, MHA, BS, LPN Health Coach

## 2016-11-26 NOTE — Patient Instructions (Signed)
[name] , Thank you for taking time to come for your Medicare Wellness Visit. I appreciate your ongoing commitment to your health goals. Please review the following plan we discussed and let me know if I can assist you in the future.   These are the goals we discussed: Goals    . Weight < 176 lb (79.833 kg)          Starting 11/26/16, I will attempt to reduce all added sugar in diet and increase exercise for up to 90 min 3 days per week.        This is a list of the screening recommended for you and due dates:  Health Maintenance  Topic Date Due  . Mammogram  11/20/2017*  . Flu Shot  03/20/2017  . Colon Cancer Screening  11/20/2017  . Tetanus Vaccine  09/08/2019  . DEXA scan (bone density measurement)  Completed  .  Hepatitis C: One time screening is recommended by Center for Disease Control  (CDC) for  adults born from [birth year] through 1967.   Completed  . Pneumonia vaccines  Completed  *Topic was postponed. The date shown is not the original due date.   Preventive Care for Adults  A healthy lifestyle and preventive care can promote health and wellness. Preventive health guidelines for adults include the following key practices.  . A routine yearly physical is a good way to check with your health care provider about your health and preventive screening. It is a chance to share any concerns and updates on your health and to receive a thorough exam.  . Visit your dentist for a routine exam and preventive care every 6 months. Brush your teeth twice a day and floss once a day. Good oral hygiene prevents tooth decay and gum disease.  . The frequency of eye exams is based on your age, health, family medical history, use  of contact lenses, and other factors. Follow your health care provider's ecommendations for frequency of eye exams.  . Eat a healthy diet. Foods like vegetables, fruits, whole grains, low-fat dairy products, and lean protein foods contain the nutrients you need without too  many calories. Decrease your intake of foods high in solid fats, added sugars, and salt. Eat the right amount of calories for you. Get information about a proper diet from your health care provider, if necessary.  . Regular physical exercise is one of the most important things you can do for your health. Most adults should get at least 150 minutes of moderate-intensity exercise (any activity that increases your heart rate and causes you to sweat) each week. In addition, most adults need muscle-strengthening exercises on 2 or more days a week.  Silver Sneakers may be a benefit available to you. To determine eligibility, you may visit the website: www.silversneakers.com or contact program at 212-033-4742 Mon-Fri between 8AM-8PM.   . Maintain a healthy weight. The body mass index (BMI) is a screening tool to identify possible weight problems. It provides an estimate of body fat based on height and weight. Your health care provider can find your BMI and can help you achieve or maintain a healthy weight.   For adults 20 years and older: ? A BMI below 18.5 is considered underweight. ? A BMI of 18.5 to 24.9 is normal. ? A BMI of 25 to 29.9 is considered overweight. ? A BMI of 30 and above is considered obese.   . Maintain normal blood lipids and cholesterol levels by exercising and minimizing your  intake of saturated fat. Eat a balanced diet with plenty of fruit and vegetables. Blood tests for lipids and cholesterol should begin at age 69 and be repeated every 5 years. If your lipid or cholesterol levels are high, you are over 50, or you are at high risk for heart disease, you may need your cholesterol levels checked more frequently. Ongoing high lipid and cholesterol levels should be treated with medicines if diet and exercise are not working.  . If you smoke, find out from your health care provider how to quit. If you do not use tobacco, please do not start.  . If you choose to drink alcohol, please  do not consume more than 2 drinks per day. One drink is considered to be 12 ounces (355 mL) of beer, 5 ounces (148 mL) of wine, or 1.5 ounces (44 mL) of liquor.  . If you are 55-27 years old, ask your health care provider if you should take aspirin to prevent strokes.  . Use sunscreen. Apply sunscreen liberally and repeatedly throughout the day. You should seek shade when your shadow is shorter than you. Protect yourself by wearing long sleeves, pants, a wide-brimmed hat, and sunglasses year round, whenever you are outdoors.  . Once a month, do a whole body skin exam, using a mirror to look at the skin on your back. Tell your health care provider of new moles, moles that have irregular borders, moles that are larger than a pencil eraser, or moles that have changed in shape or color.

## 2016-11-30 ENCOUNTER — Encounter: Payer: Self-pay | Admitting: Family Medicine

## 2016-11-30 ENCOUNTER — Ambulatory Visit (INDEPENDENT_AMBULATORY_CARE_PROVIDER_SITE_OTHER): Payer: Medicare Other | Admitting: Family Medicine

## 2016-11-30 VITALS — BP 124/68 | HR 65 | Temp 98.6°F | Ht 67.0 in | Wt 213.0 lb

## 2016-11-30 DIAGNOSIS — E7800 Pure hypercholesterolemia, unspecified: Secondary | ICD-10-CM | POA: Diagnosis not present

## 2016-11-30 DIAGNOSIS — R739 Hyperglycemia, unspecified: Secondary | ICD-10-CM

## 2016-11-30 DIAGNOSIS — E039 Hypothyroidism, unspecified: Secondary | ICD-10-CM

## 2016-11-30 DIAGNOSIS — E559 Vitamin D deficiency, unspecified: Secondary | ICD-10-CM | POA: Diagnosis not present

## 2016-11-30 DIAGNOSIS — C50411 Malignant neoplasm of upper-outer quadrant of right female breast: Secondary | ICD-10-CM

## 2016-11-30 DIAGNOSIS — I10 Essential (primary) hypertension: Secondary | ICD-10-CM | POA: Diagnosis not present

## 2016-11-30 DIAGNOSIS — J479 Bronchiectasis, uncomplicated: Secondary | ICD-10-CM

## 2016-11-30 MED ORDER — LEVOTHYROXINE SODIUM 50 MCG PO TABS: 50.0000 ug | ORAL_TABLET | Freq: Every day | ORAL | 3 refills | Status: DC

## 2016-11-30 MED ORDER — FLUTICASONE PROPIONATE 50 MCG/ACT NA SUSP: 2.0000 | Freq: Every day | NASAL | 3 refills | Status: DC | PRN

## 2016-11-30 NOTE — Patient Instructions (Addendum)
Take care of yourself  Try to get back to the low sugar diet since you felt better This may keep you from becoming diabetic in the future  Do some stretching and exercise as tolerated  No change in medicines

## 2016-11-30 NOTE — Progress Notes (Signed)
Subjective:    Patient ID: [name], female    DOB: [birth year], 66 y.o.   MRN: [id number]  HPI Here for annual f/u of chronic health problems   Terrible allergies - added singulair recently    Wt Readings from Last 3 Encounters:  11/30/16 213 lb (96.6 kg)  11/26/16 211 lb 8 oz (95.9 kg)  11/20/16 211 lb (95.7 kg)  wt is up 2 lb  "I can't stop eating"- does not know why she thinks about food all the time  Eats out too much  Some stress eating    (lost her brother this year) - he had hyperparathyroidism  bmi 33.3  Had her amw on 4/9 One fall - counseled on fall prev   Mammogram 4/17 nl (hx of lumpectomy)-she has that scheduled 4/20 Some breast soreness -ongoing (went to her surgeon)  Self breast exam Hysterectomy in the past  Colonoscopy 4/14 polyp and tics - 5 y recall   dexa 6/13 in the normal range  D level 44.8 Taking 5000 iu per day  Zoster imm 2/13  bp is stable today  No cp or palpitations or headaches or edema  No side effects to medicines  BP Readings from Last 3 Encounters:  11/30/16 124/68  11/26/16 118/70  11/20/16 124/72     Hx of hypothyroidism Hypothyroidism  Pt has no clinical changes No change in energy level/ hair or skin/ edema and no tremor Lab Results  Component Value Date   TSH 2.71 11/26/2016    On levothyrox 50 mcg  Hx of hyperlipidemia Lab Results  Component Value Date   CHOL 172 11/26/2016   CHOL 147 11/15/2015   CHOL 156 06/18/2014   Lab Results  Component Value Date   HDL 48.50 11/26/2016   HDL 48.20 11/15/2015   HDL 52.80 06/18/2014   Lab Results  Component Value Date   LDLCALC 96 11/26/2016   LDLCALC 82 11/15/2015   LDLCALC 90 06/18/2014   Lab Results  Component Value Date   TRIG 135.0 11/26/2016   TRIG 85.0 11/15/2015   TRIG 67.0 06/18/2014   Lab Results  Component Value Date   CHOLHDL 4 11/26/2016   CHOLHDL 3 11/15/2015   CHOLHDL 3 06/18/2014   Lab Results  Component Value Date   LDLDIRECT 141.8  07/04/2007  simvastatin and diet  Stable    Hx of hyperglycemia Lab Results  Component Value Date   HGBA1C 5.5 11/26/2016  same as last year  No sugar from feb -April      Chemistry      Component Value Date/Time   NA 140 11/26/2016 1029   NA 141 12/01/2014 0847   K 3.9 11/26/2016 1029   K 3.8 12/01/2014 0847   CL 105 11/26/2016 1029   CO2 31 11/26/2016 1029   CO2 26 12/01/2014 0847   BUN 10 11/26/2016 1029   BUN 10.6 12/01/2014 0847   CREATININE 0.84 11/26/2016 1029   CREATININE 0.70 08/16/2016 1452   CREATININE 0.8 12/01/2014 0847      Component Value Date/Time   CALCIUM 9.9 11/26/2016 1029   CALCIUM 9.8 12/01/2014 0847   ALKPHOS 112 11/26/2016 1029   ALKPHOS 127 12/01/2014 0847   AST 25 11/26/2016 1029   AST 29 12/01/2014 0847   ALT 27 11/26/2016 1029   ALT 33 12/01/2014 0847   BILITOT 0.4 11/26/2016 1029   BILITOT 0.42 12/01/2014 0847      Lab Results  Component Value Date   WBC  4.0 11/26/2016   HGB 13.0 11/26/2016   HCT 39.6 11/26/2016   MCV 89.6 11/26/2016   PLT 213.0 11/26/2016     Patient Active Problem List   Diagnosis Date Noted  . Primary osteoarthritis of both hands 09/27/2016  . Primary osteoarthritis of both knees 09/27/2016  . Urinary frequency 09/26/2016  . Screen for STD (sexually transmitted disease) 09/26/2016  . Nasopharyngitis, 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/2016  . Postnasal drip 05/18/2016  . S/P total knee replacement 02/13/2016  . Chronic pain of both knees 12/06/2015  . Globus pharyngeus 05/17/2015  . Productive cough 05/17/2015  . Genetic testing 12/23/2014  . Family history of breast cancer   . Family history of colon cancer   . Family history of pancreatic cancer   . Breast cancer of upper-outer quadrant of right female breast ([place]) 11/23/2014  . Allergy to ACE inhibitors 09/27/2014  . Encounter for Medicare annual wellness exam 06/27/2014  . Hyperglycemia 06/23/2013  . Routine general medical examination at a health care facility  06/23/2013  . Dizziness and giddiness 01/16/2013  . Disturbance of skin sensation 01/16/2013  . RLS (restless legs syndrome) 01/15/2013  . Hx of Clostridium difficile infection 10/10/2012  . Bronchiectasis without acute exacerbation ([place]) 04/29/2012  . Dyspnea on exertion 04/02/2012  . Non-ischemic cardiomyopathy ([place]) 03/07/2012  . Colon cancer screening 01/11/2012  . Post-menopausal 01/11/2012  . Chest pain 11/07/2011  . Palpitations 04/17/2011  . Abnormal tympanic membrane 01/22/2011  . Obstructive sleep apnea 04/04/2010  . VENTRICULAR TACHYCARDIA 01/03/2009  . ICD  [employer]  Single chamber 01/03/2009  . PATENT FORAMEN OVALE 09/29/2008  . Vitamin D deficiency 08/17/2008  . Hypothyroidism 11/18/2006  . Hyperlipidemia 11/18/2006  . DEPRESSION 11/18/2006  . Essential hypertension 11/18/2006  . MITRAL VALVE PROLAPSE 11/18/2006  . CEREBRAL ANEURYSM 11/18/2006  . Allergic rhinitis 11/18/2006  . GERD 11/18/2006  . DIVERTICULOSIS, COLON 11/18/2006  . Fatty liver 11/18/2006  . Fibromyalgia 11/18/2006   Past Medical History:  Diagnosis Date  . AICD (automatic cardioverter/defibrillator) present   . Anxiety   . Arthritis   . Breast cancer ([place]) 2016   DCIS ER-/PR-  . Cerebral aneurysm, nonruptured   . Clostridium difficile infection   . Depressive disorder, not elsewhere classified   . Diverticulosis   . Diverticulosis of colon (without mention of hemorrhage)   . Esophageal reflux   . Fibromyalgia   . Gastritis   . GERD (gastroesophageal reflux disease)   . GI bleed   . Hiatal hernia   . Hiatal hernia   . History of hiatal hernia   . Hyperlipidemia   . Hypertension   . Hypothyroidism   . Internal hemorrhoids   . Obstructive sleep apnea (adult) (pediatric)   . Ostium secundum type atrial septal defect   . Other chronic nonalcoholic liver disease   . Other pulmonary embolism and infarction   . Paroxysmal ventricular tachycardia ([place])   . Pneumonia    history   . Presence of permanent cardiac pacemaker   . PUD (peptic ulcer disease)   . Radiation 02/03/15-03/10/15   Right Breast  . Sarcoid   . Stroke [facility]) 2013   tia  . Takotsubo syndrome   . Unspecified transient cerebral ischemia   . Unspecified vitamin D deficiency    Past Surgical History:  Procedure Laterality Date  . ABDOMINAL HYSTERECTOMY    . ABI  2006   normal  . BREAST LUMPECTOMY WITH RADIOACTIVE SEED LOCALIZATION Right 01/03/2015   Procedure: BREAST LUMPECTOMY WITH RADIOACTIVE  SEED LOCALIZATION;  Surgeon: Autumn Messing III, MD;  Location: [place];  Service: General;  Laterality: Right;  . CARDIAC DEFIBRILLATOR PLACEMENT  2006; 2012   BSX single chamber ICD  . carotid dopplers  2006   neg  . CEREBRAL ANEURYSM REPAIR  02/1999  . hospitalization  2004   GI bleed, PUD, diverticulosis (EGD,colonscopy)  . hospitalization     PE, NSVT, s/p defib  . KNEE ARTHROSCOPY     bilateral  . LEFT HEART CATHETERIZATION WITH CORONARY ANGIOGRAM N/A 02/22/2012   Procedure: LEFT HEART CATHETERIZATION WITH CORONARY ANGIOGRAM;  Surgeon: Burnell Blanks, MD;  Location: [facility] CATH LAB;  Service: Cardiovascular;  Laterality: N/A;  . PARTIAL HYSTERECTOMY     Fibroids  . TOTAL KNEE ARTHROPLASTY Left 02/13/2016   Procedure: TOTAL KNEE ARTHROPLASTY;  Surgeon: Vickey Huger, MD;  Location: [place];  Service: Orthopedics;  Laterality: Left;   Social History  Substance Use Topics  . Smoking status: Former Smoker    Packs/day: 1.00    Years: 15.00    Types: Cigarettes    Quit date: 08/20/1989  . Smokeless tobacco: Never Used     Comment: Started at 30; less than 1 PPD  . Alcohol use 0.0 oz/week     Comment: rare   Family History  Problem Relation Age of Onset  . Diabetes Mother   . Alzheimer's disease Mother   . Other Brother     Thyroid problem  . Allergies Sister   . Heart disease Sister   . Heart disease Brother   . Prostate cancer Brother 60    same brother as throat cancer  . Throat cancer  Brother     dx in his 49s; also a smoker  . Heart disease Father   . Cancer Maternal Grandmother 6    colon cancer or abdominal cancer  . Lung cancer Maternal Grandfather 78  . Breast cancer Maternal Aunt 23  . Colon cancer Maternal Aunt 67    same sister as breast at 29  . Lung cancer Maternal Uncle   . Breast cancer Maternal Aunt     dx in her 3s  . Pancreatic cancer Cousin 71    maternal first cousin  . Breast cancer Cousin     paternal first cousin twice removed died in her 19s  . Cardiomyopathy      Family history  . Heart attack Sister   . Hypertension Sister   . Hypertension Brother   . Stroke Neg Hx    Allergies  Allergen Reactions  . Ace Inhibitors Swelling    Angioedema; makes tongue "break out" Angioedema; makes tongue "break out"  . Atorvastatin Other (See Comments)    SEVERE MYALGIAS, MUSCLE PAIN SEVERE MYALGIAS, MUSCLE PAIN  . Ramipril Other (See Comments)    TONGUE ULCERS TONGUE ULCERS  . Rosuvastatin Other (See Comments)    MYALGIAS, SEVERE MUSCLE PAIN MYALGIAS, SEVERE MUSCLE PAIN  . Amitriptyline Hcl Other (See Comments)    Unknown  . Amitriptyline Hcl Other (See Comments)    Unknown  . Iodine-131 Other (See Comments)  . Paroxetine Other (See Comments)    UNKNOWN UNKNOWN  . Carbamazepine Rash    REACTION: ? reaction REACTION: ? reaction  . Cetirizine Hcl Other (See Comments)    REACTION: sleepy  . Cetirizine Hcl Other (See Comments)    REACTION: sleepy  . Codeine Itching  . Dilantin [Phenytoin Sodium Extended] Rash  . Phenytoin Sodium Extended Rash   Current Outpatient Prescriptions on  File Prior to Visit  Medication Sig Dispense Refill  . acetaminophen (TYLENOL) 500 MG tablet Take 500 mg by mouth every 6 (six) hours as needed (pain).     [name] aspirin 81 MG tablet Take 81 mg by mouth daily.    . baclofen (LIORESAL) 10 MG tablet Take 1 tablet (10 mg total) by mouth 3 (three) times daily. 30 each 5  . cholecalciferol (VITAMIN D) 1000 UNITS  tablet Take 2,000 Units by mouth daily.    . fexofenadine (ALLEGRA) 180 MG tablet Take 180 mg by mouth daily as needed for allergies or rhinitis.    . hydrochlorothiazide (HYDRODIURIL) 25 MG tablet Take 1 tablet (25 mg total) by mouth daily. 90 tablet 3  . latanoprost (XALATAN) 0.005 % ophthalmic solution Place 1 drop into both eyes at bedtime.    . Metoprolol Tartrate 75 MG TABS Take 75 mg by mouth 2 (two) times daily. 180 tablet 3  . montelukast (SINGULAIR) 10 MG tablet Take 1 tablet (10 mg total) by mouth at bedtime. 90 tablet 3  . Multiple Vitamins-Minerals (MULTIVITAMIN PO) Take 1 tablet by mouth daily.    . pantoprazole (PROTONIX) 40 MG tablet Take 1 tablet (40 mg total) by mouth 2 (two) times daily before a meal. 180 tablet 3  . polyethylene glycol powder (GLYCOLAX/MIRALAX) powder Take 17 grams by mouth daily 1080 g 0  . potassium chloride (K-DUR,KLOR-CON) 10 MEQ tablet TAKE 1/2 TABLET (5MEQ TOTAL) EVERY DAY 45 tablet 3  . simvastatin (ZOCOR) 40 MG tablet Take 0.5 tablets (20 mg total) by mouth daily. 45 tablet 3  . zolpidem (AMBIEN) 10 MG tablet TAKE 1 TABLET BY MOUTH AT BEDTIME AS NEEDED FOR SLEEP 90 tablet 1  . metoprolol (LOPRESSOR) 50 MG tablet Take 1 tablet (50 mg total) by mouth daily as needed. 15 tablet 3  . [DISCONTINUED] metoprolol succinate (TOPROL-XL) 50 MG 24 hr tablet Take 2 tablets (100 mg total) by mouth daily. Take with or immediately following a meal. 90 tablet 3  . [DISCONTINUED] potassium chloride (K-DUR) 10 MEQ tablet Take 0.5 tablets (5 mEq total) by mouth daily. 1/2 tab po qd 45 tablet 1   No current facility-administered medications on file prior to visit.     Review of Systems Review of Systems  Constitutional: Negative for fever, appetite change, fatigue and unexpected weight change.  Eyes: Negative for pain and visual disturbance.  Respiratory: Negative for cough and shortness of breath.  pos for diag of bronchiectasis- with occ wheeze  Cardiovascular:  Negative for cp or palpitations    Gastrointestinal: Negative for nausea, diarrhea and constipation.  Genitourinary: Negative for urgency and frequency.  Skin: Negative for pallor or rash   Neurological: Negative for weakness, light-headedness, numbness and headaches.  Hematological: Negative for adenopathy. Does not bruise/bleed easily.  Psychiatric/Behavioral: Negative for dysphoric mood. The patient is not nervous/anxious.         Objective:   Physical Exam  Constitutional: She appears well-developed and well-nourished. No distress.  obese and well appearing   HENT:  Head: Normocephalic and atraumatic.  Right Ear: External ear normal.  Left Ear: External ear normal.  Mouth/Throat: Oropharynx is clear and moist.  Eyes: Conjunctivae and EOM are normal. Pupils are equal, round, and reactive to light. No scleral icterus.  Neck: Normal range of motion. Neck supple. No JVD present. Carotid bruit is not present. No thyromegaly present.  Cardiovascular: Normal rate, regular rhythm and intact distal pulses.  Exam reveals no gallop.   Murmur  heard. Pulmonary/Chest: Effort normal and breath sounds normal. No respiratory distress. She has no wheezes. She exhibits no tenderness.  Abdominal: Soft. Bowel sounds are normal. She exhibits no distension, no abdominal bruit and no mass. There is no tenderness.  Genitourinary: No breast swelling, tenderness, discharge or bleeding.  Genitourinary Comments: Breast exam: No mass, nodules, thickening, tenderness, bulging, retraction, inflamation, nipple discharge or skin changes noted.  No axillary or clavicular LA.    Baseline scar noted    Musculoskeletal: Normal range of motion. She exhibits no edema or tenderness.  Lymphadenopathy:    She has no cervical adenopathy.  Neurological: She is alert. She has normal reflexes. No cranial nerve deficit. She exhibits normal muscle tone. Coordination normal.  Skin: Skin is warm and dry. No rash noted. No  erythema. No pallor.  Lentigines/skin tags noted on trunk  Psychiatric: She has a normal mood and affect.          Assessment & Plan:   Problem List Items Addressed This Visit      Cardiovascular and Mediastinum   Essential hypertension - Primary    bp in fair control at this time  BP Readings from Last 1 Encounters:  11/30/16 124/68   No changes needed Disc lifstyle change with low sodium diet and exercise  Labs reviewed         Respiratory   Bronchiectasis without acute exacerbation (HCC)    Pt continues pulmonary f/u        Endocrine   Hypothyroidism    Hypothyroidism  Pt has no clinical changes No change in energy level/ hair or skin/ edema and no tremor Lab Results  Component Value Date   TSH 2.71 11/26/2016          Relevant Medications   levothyroxine (SYNTHROID, LEVOTHROID) 50 MCG tablet     Other   Breast cancer of upper-outer quadrant of right female breast ([place])    Continues to do well with follow ups  Mammogram due and pt has it scheduled for 4/20  No lumps on self exam      Hyperglycemia    Lab Results  Component Value Date   HGBA1C 5.5 11/26/2016  disc imp of low glycemic diet and wt loss to prevent DM2       Hyperlipidemia    Disc goals for lipids and reasons to control them Rev labs with pt Rev low sat fat diet in detail  Continue simvastatin and diet       Vitamin D deficiency    Vitamin D level is therapeutic with current supplementation Disc importance of this to bone and overall health

## 2016-12-02 NOTE — Assessment & Plan Note (Signed)
Lab Results  Component Value Date   HGBA1C 5.5 11/26/2016  disc imp of low glycemic diet and wt loss to prevent DM2

## 2016-12-02 NOTE — Assessment & Plan Note (Signed)
bp in fair control at this time  BP Readings from Last 1 Encounters:  11/30/16 124/68   No changes needed Disc lifstyle change with low sodium diet and exercise  Labs reviewed

## 2016-12-02 NOTE — Assessment & Plan Note (Signed)
Continues to do well with follow ups  Mammogram due and pt has it scheduled for 4/20  No lumps on self exam

## 2016-12-02 NOTE — Assessment & Plan Note (Signed)
Hypothyroidism  Pt has no clinical changes No change in energy level/ hair or skin/ edema and no tremor Lab Results  Component Value Date   TSH 2.71 11/26/2016

## 2016-12-02 NOTE — Assessment & Plan Note (Signed)
Pt continues pulmonary f/u 

## 2016-12-02 NOTE — Assessment & Plan Note (Signed)
Disc goals for lipids and reasons to control them Rev labs with pt Rev low sat fat diet in detail  Continue simvastatin and diet

## 2016-12-02 NOTE — Assessment & Plan Note (Signed)
Vitamin D level is therapeutic with current supplementation Disc importance of this to bone and overall health  

## 2016-12-03 NOTE — Progress Notes (Signed)
Electrophysiology Office Note Date: 12/05/2016  ID:  [name], DOB 09-Dec-1950, MRN [id number]  PCP: Adriana Pardon, MD Electrophysiologist: [name]  CC: ICD and atrial tachycardia follow up  [name] is a 66 y.o. female is seen today for Dr [name].  She presents today for routine follow up of atrial tachycardia and PVC's.  Her Metoprolol was increased at the end of 2017 and at her last visit with Dr [name], she was advised she could take an extra Metoprolol as needed. Since last being seen in our clinic, the patient reports doing very well. She denies chest pain, dyspnea, PND, orthopnea, nausea, vomiting, syncope.  She has not had ICD shocks.    Device History: BSX single chamber ICD implanted 2005 with generator change 2011 for non-ischemic cardiomyopathy and cardiac sarcoid History of appropriate therapy: No History of AAD therapy: No   Past Medical History:  Diagnosis Date  . AICD (automatic cardioverter/defibrillator) present   . Anxiety   . Arthritis   . 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 Cerebral aneurysm, nonruptured   . Clostridium difficile infection   . Depressive disorder, not elsewhere classified   . Diverticulosis   . Diverticulosis of colon (without mention of hemorrhage)   . Esophageal reflux   . Fibromyalgia   . Gastritis   . GERD (gastroesophageal reflux disease)   . GI bleed   . Hiatal hernia   . Hiatal hernia   . History of hiatal hernia   . Hyperlipidemia   . Hypertension   . Hypothyroidism   . Internal hemorrhoids   . Obstructive sleep apnea (adult) (pediatric)   . Ostium secundum type atrial septal defect   . Other chronic nonalcoholic liver disease   . Other pulmonary embolism and infarction   . Paroxysmal ventricular tachycardia ([name])   . Pneumonia    history  . Presence of permanent cardiac pacemaker   . PUD (peptic ulcer disease)   . Radiation 02/03/15-03/10/15   Right Breast  . Sarcoid   . Stroke [name]) 2013   tia  .  Takotsubo syndrome   . Unspecified transient cerebral ischemia   . Unspecified vitamin D deficiency    Past Surgical History:  Procedure Laterality Date  . ABDOMINAL HYSTERECTOMY    . ABI  2006   normal  . BREAST LUMPECTOMY WITH RADIOACTIVE SEED LOCALIZATION Right 01/03/2015   Procedure: BREAST LUMPECTOMY WITH RADIOACTIVE SEED LOCALIZATION;  Surgeon: Adriana Messing III, MD;  Location: [facility];  Service: General;  Laterality: Right;  . CARDIAC DEFIBRILLATOR PLACEMENT  2006; 2012   BSX single chamber ICD  . carotid dopplers  2006   neg  . CEREBRAL ANEURYSM REPAIR  02/1999  . hospitalization  2004   GI bleed, PUD, diverticulosis (EGD,colonscopy)  . hospitalization     PE, NSVT, s/p defib  . KNEE ARTHROSCOPY     bilateral  . LEFT HEART CATHETERIZATION WITH CORONARY ANGIOGRAM N/A 02/22/2012   Procedure: LEFT HEART CATHETERIZATION WITH CORONARY ANGIOGRAM;  Surgeon: Adriana Blanks, MD;  Location: [facility] CATH LAB;  Service: Cardiovascular;  Laterality: N/A;  . PARTIAL HYSTERECTOMY     Fibroids  . TOTAL KNEE ARTHROPLASTY Left 02/13/2016   Procedure: TOTAL KNEE ARTHROPLASTY;  Surgeon: Adriana Huger, MD;  Location: [facility];  Service: Orthopedics;  Laterality: Left;    Current Outpatient Prescriptions  Medication Sig Dispense Refill  . acetaminophen (TYLENOL) 500 MG tablet Take 500 mg by mouth every 6 (six) hours as needed (  pain).     . aspirin 81 MG tablet Take 81 mg by mouth daily.    . baclofen (LIORESAL) 10 MG tablet Take 1 tablet (10 mg total) by mouth 3 (three) times daily. 30 each 5  . cholecalciferol (VITAMIN D) 1000 UNITS tablet Take 2,000 Units by mouth daily.    . fexofenadine (ALLEGRA) 180 MG tablet Take 180 mg by mouth daily as needed for allergies or rhinitis.    . fluticasone (FLONASE) 50 MCG/ACT nasal spray Place 2 sprays into both nostrils daily as needed for allergies or rhinitis. 48 g 3  . hydrochlorothiazide (HYDRODIURIL) 25 MG tablet Take 1 tablet (25 mg total) by mouth daily. 90  tablet 3  . latanoprost (XALATAN) 0.005 % ophthalmic solution Place 1 drop into both eyes at bedtime.    [name] levothyroxine (SYNTHROID, LEVOTHROID) 50 MCG tablet Take 1 tablet (50 mcg total) by mouth daily. 90 tablet 3  . Metoprolol Tartrate 75 MG TABS Take 75 mg by mouth 2 (two) times daily. 180 tablet 3  . montelukast (SINGULAIR) 10 MG tablet Take 1 tablet (10 mg total) by mouth at bedtime. 90 tablet 3  . Multiple Vitamins-Minerals (MULTIVITAMIN PO) Take 1 tablet by mouth daily.    . pantoprazole (PROTONIX) 40 MG tablet Take 1 tablet (40 mg total) by mouth 2 (two) times daily before a meal. 180 tablet 3  . polyethylene glycol powder (GLYCOLAX/MIRALAX) powder Take 17 grams by mouth daily 1080 g 0  . potassium chloride (K-DUR,KLOR-CON) 10 MEQ tablet TAKE 1/2 TABLET (5MEQ TOTAL) EVERY DAY 45 tablet 3  . simvastatin (ZOCOR) 40 MG tablet Take 0.5 tablets (20 mg total) by mouth daily. 45 tablet 3  . zolpidem (AMBIEN) 10 MG tablet TAKE 1 TABLET BY MOUTH AT BEDTIME AS NEEDED FOR SLEEP 90 tablet 1  . metoprolol (LOPRESSOR) 50 MG tablet Take 1 tablet (50 mg total) by mouth daily as needed. 15 tablet 3   No current facility-administered medications for this visit.     Allergies:   Ace inhibitors; Atorvastatin; Ramipril; Rosuvastatin; Amitriptyline hcl; Amitriptyline hcl; Iodine-131; Paroxetine; Carbamazepine; Cetirizine hcl; Cetirizine hcl; Codeine; Dilantin [phenytoin sodium extended]; and Phenytoin sodium extended   Social History: Social History   Social History  . Marital status: Divorced    Spouse name: N/A  . Number of children: 2  . Years of education: N/A   Occupational History  . DISABLED   .  Disability   Social History Main Topics  . Smoking status: Former Smoker    Packs/day: 1.00    Years: 15.00    Types: Cigarettes    Quit date: 08/20/1989  . Smokeless tobacco: Never Used     Comment: Started at 23; less than 1 PPD  . Alcohol use 0.0 oz/week     Comment: rare  . Drug use: No    . Sexual activity: No   Other Topics Concern  . Not on file   Social History Narrative   Retired from [employer]      2 children      Lives alone      Divorced    Family History: Family History  Problem Relation Age of Onset  . Diabetes Mother   . Alzheimer's disease Mother   . Other Brother     Thyroid problem  . Allergies Sister   . Heart disease Sister   . Heart disease Brother   . Prostate cancer Brother 66    same brother as throat cancer  .  Throat cancer Brother     dx in his 24s; also a smoker  . Heart disease Father   . Cancer Maternal Grandmother 71    colon cancer or abdominal cancer  . Lung cancer Maternal Grandfather 22  . Breast cancer Maternal Aunt 64  . Colon cancer Maternal Aunt 34    same sister as breast at 56  . Lung cancer Maternal Uncle   . Breast cancer Maternal Aunt     dx in her 25s  . Pancreatic cancer Cousin 32    maternal first cousin  . Breast cancer Cousin     paternal first cousin twice removed died in her 53s  . Cardiomyopathy      Family history  . Heart attack Sister   . Hypertension Sister   . Hypertension Brother   . Stroke Neg Hx     Review of Systems: All other systems reviewed and are otherwise negative except as noted above.   Physical Exam: VS:  BP 116/72   Pulse 63   Ht 5' 6.5" (1.689 m)   Wt 213 lb 12.8 oz (97 kg)   LMP  (LMP Unknown)   SpO2 98%   BMI 33.99 kg/m  , BMI Body mass index is 33.99 kg/m.  GEN- The patient is well appearing, alert and oriented x 3 today.   HEENT: normocephalic, atraumatic; sclera clear, conjunctiva pink; hearing intact; oropharynx clear; neck supple Lungs- Clear to ausculation bilaterally, normal work of breathing.  No wheezes, rales, rhonchi Heart- Regular rate and rhythm, no murmurs, rubs or gallops  GI- soft, non-tender, non-distended, bowel sounds present Extremities- no clubbing, cyanosis, or edema MS- no significant deformity or atrophy Skin- warm and dry, no rash or  lesion; ICD pocket well healed Psych- euthymic mood, full affect Neuro- strength and sensation are intact  EKG:  EKG is not ordered today.  Recent Labs: 06/14/2016: Magnesium 2.1 08/16/2016: Brain Natriuretic Peptide 30.2 11/26/2016: ALT 27; BUN 10; Creatinine, Ser 0.84; Hemoglobin 13.0; Platelets 213.0; Potassium 3.9; Sodium 140; TSH 2.71   Wt Readings from Last 3 Encounters:  12/05/16 213 lb 12.8 oz (97 kg)  11/30/16 213 lb (96.6 kg)  11/26/16 211 lb 8 oz (95.9 kg)     Other studies Reviewed: Additional studies/ records that were reviewed today include: Dr Olin Pia office ntoes  Assessment and Plan:  1.  Atrial tachycardia Continue Metoprolol Burden improved on higher dose of Metoprolol  2.  HTN Stable No change required today  3.  Non ischemic cardiomyopathy?sarcoid Normal ICD function See Pace Art report No changes today She has 2 additional family members that have had cardiac arrest. Will refer for genetic evaluation in our office.    Current medicines are reviewed at length with the patient today.   The patient does not have concerns regarding her medicines.  The following changes were made today: none  Labs/ tests ordered today include: none   Disposition:   [place], follow up with Dr [name] in 12 months   Signed, Chanetta Marshall, NP 12/05/2016 12:25 PM  [place] [address] [place] [address] [phone] (office) [phone] (fax)

## 2016-12-05 ENCOUNTER — Encounter: Payer: Self-pay | Admitting: Nurse Practitioner

## 2016-12-05 ENCOUNTER — Ambulatory Visit (INDEPENDENT_AMBULATORY_CARE_PROVIDER_SITE_OTHER): Payer: Medicare Other | Admitting: Nurse Practitioner

## 2016-12-05 VITALS — BP 116/72 | HR 63 | Ht 66.5 in | Wt 213.8 lb

## 2016-12-05 DIAGNOSIS — I10 Essential (primary) hypertension: Secondary | ICD-10-CM | POA: Diagnosis not present

## 2016-12-05 DIAGNOSIS — I471 Supraventricular tachycardia: Secondary | ICD-10-CM | POA: Diagnosis not present

## 2016-12-05 DIAGNOSIS — I428 Other cardiomyopathies: Secondary | ICD-10-CM

## 2016-12-05 LAB — CUP PACEART INCLINIC DEVICE CHECK
Date Time Interrogation Session: 20180418122354
Implantable Lead Implant Date: 20050603
Implantable Lead Location: 753860
Implantable Lead Model: 185
Implantable Lead Serial Number: 116340
MDC IDC PG IMPLANT DT: 20110505
MDC IDC PG SERIAL: 266301

## 2016-12-05 MED ORDER — METOPROLOL TARTRATE 50 MG PO TABS: 50.0000 mg | ORAL_TABLET | Freq: Every day | ORAL | 3 refills | Status: DC | PRN

## 2016-12-05 NOTE — Addendum Note (Signed)
Addended by: Della Goo C on: 12/05/2016 12:28 PM   Modules accepted: Orders

## 2016-12-05 NOTE — Patient Instructions (Signed)
Medication Instructions:  None Ordered   Labwork: None Ordered   Testing/Procedures: None Ordered   Follow-Up: Remote monitoring is used to monitor your Pacemaker from home. This monitoring reduces the number of office visits required to check your device to one time per year. It allows us to keep an eye on the functioning of your device to ensure it is working properly. You are scheduled for a device check from home on 03/06/2017. You may send your transmission at any time that day. If you have a wireless device, the transmission will be sent automatically. After your physician reviews your transmission, you will receive a postcard with your next transmission date.  Your physician wants you to follow-up in: 1 year with Dr. Klein. You will receive a reminder letter in the mail two months in advance. If you don't receive a letter, please call our office to schedule the follow-up appointment.   Any Other Special Instructions Will Be Listed Below (If Applicable).     If you need a refill on your cardiac medications before your next appointment, please call your pharmacy.   

## 2016-12-06 ENCOUNTER — Encounter: Payer: Self-pay | Admitting: Podiatry

## 2016-12-06 ENCOUNTER — Ambulatory Visit (INDEPENDENT_AMBULATORY_CARE_PROVIDER_SITE_OTHER): Payer: Medicare Other | Admitting: Podiatry

## 2016-12-06 DIAGNOSIS — M722 Plantar fascial fibromatosis: Secondary | ICD-10-CM | POA: Diagnosis not present

## 2016-12-06 DIAGNOSIS — M779 Enthesopathy, unspecified: Secondary | ICD-10-CM | POA: Diagnosis not present

## 2016-12-06 MED ORDER — TRIAMCINOLONE ACETONIDE 10 MG/ML IJ SUSP
10.0000 mg | Freq: Once | INTRAMUSCULAR | Status: AC
  Administered 2016-12-06: 10 mg

## 2016-12-07 ENCOUNTER — Other Ambulatory Visit: Payer: Self-pay | Admitting: Family Medicine

## 2016-12-07 NOTE — Telephone Encounter (Signed)
Px written for call in   

## 2016-12-07 NOTE — Progress Notes (Signed)
Subjective:     Patient ID: [name], female   DOB: [birth year], 66 y.o.   MRN: [id number]  HPI patient states she's developed a lot of pain in the bottom of the right arch and heel and states she feels like she needs to stretch it and she also has inflammation of the tendon group on the lateral side of the foot   Review of Systems     Objective:   Physical Exam  Neurovascular status intact negative Homans sign noted with patient found to have mid arch inflammation right with fluid buildup and pain and is noted on the outside of the right foot have inflammation at the base of fifth metatarsal at the insertion of peroneal tendon    Assessment:     2 separate problems with one being plantar fasciitis and one being lateral tendinitis    Plan:     H&P and both conditions discussed separately. I'm focusing on the plantar heel and I injected the mid arch and plantar heel 3 mg Kenalog 5 mg Xylocaine advised on physical therapy dispensed a night splint with instructions on usage and fascial brace to lift the arch up. Patient be seen back in the next 3 weeks or earlier if needed  X-rays were negative for signs of bone pathology with spur formation and moderate arthritic issues noted

## 2016-12-07 NOTE — Telephone Encounter (Signed)
CPE was on 11/26/16, last filled on 06/11/16 #90 tabs with 1 additional refills, please advise

## 2016-12-07 NOTE — Telephone Encounter (Signed)
Rx called in as prescribed 

## 2016-12-17 ENCOUNTER — Other Ambulatory Visit: Payer: Self-pay | Admitting: Family Medicine

## 2016-12-25 ENCOUNTER — Other Ambulatory Visit: Payer: Self-pay | Admitting: General Surgery

## 2016-12-25 DIAGNOSIS — Z9889 Other specified postprocedural states: Secondary | ICD-10-CM

## 2017-01-04 ENCOUNTER — Ambulatory Visit
Admission: RE | Admit: 2017-01-04 | Discharge: 2017-01-04 | Disposition: A | Discharge status: Home / Self Care | Payer: Medicare Other | Source: Ambulatory Visit | Attending: Hematology | Admitting: Hematology

## 2017-01-04 DIAGNOSIS — C50411 Malignant neoplasm of upper-outer quadrant of right female breast: Secondary | ICD-10-CM

## 2017-01-04 HISTORY — DX: Personal history of irradiation: Z92.3

## 2017-01-10 ENCOUNTER — Ambulatory Visit: Payer: Medicare Other | Admitting: Genetic Counselor

## 2017-01-11 ENCOUNTER — Ambulatory Visit: Payer: Medicare Other | Admitting: Adult Health

## 2017-01-17 ENCOUNTER — Encounter: Payer: Self-pay | Admitting: Adult Health

## 2017-01-17 ENCOUNTER — Ambulatory Visit (INDEPENDENT_AMBULATORY_CARE_PROVIDER_SITE_OTHER): Payer: Medicare Other | Admitting: Adult Health

## 2017-01-17 ENCOUNTER — Ambulatory Visit (INDEPENDENT_AMBULATORY_CARE_PROVIDER_SITE_OTHER): Payer: Medicare Other | Admitting: Pulmonary Disease

## 2017-01-17 DIAGNOSIS — J479 Bronchiectasis, uncomplicated: Secondary | ICD-10-CM

## 2017-01-17 DIAGNOSIS — G4733 Obstructive sleep apnea (adult) (pediatric): Secondary | ICD-10-CM | POA: Diagnosis not present

## 2017-01-17 LAB — PULMONARY FUNCTION TEST
DL/VA % pred: 80 %
DL/VA: 4.09 ml/min/mmHg/L
DLCO COR % PRED: 57 %
DLCO cor: 15.8 ml/min/mmHg
DLCO unc % pred: 58 %
DLCO unc: 15.9 ml/min/mmHg
FEF 25-75 Post: 3.95 L/sec
FEF 25-75 Pre: 4.14 L/sec
FEF2575-%Change-Post: -4 %
FEF2575-%Pred-Post: 205 %
FEF2575-%Pred-Pre: 215 %
FEV1-%Change-Post: -2 %
FEV1-%Pred-Post: 111 %
FEV1-%Pred-Pre: 113 %
FEV1-POST: 2.32 L
FEV1-Pre: 2.38 L
FEV1FVC-%CHANGE-POST: 1 %
FEV1FVC-%Pred-Pre: 115 %
FEV6-%Change-Post: -4 %
FEV6-%PRED-PRE: 102 %
FEV6-%Pred-Post: 97 %
FEV6-POST: 2.52 L
FEV6-PRE: 2.63 L
FEV6FVC-%PRED-PRE: 104 %
FEV6FVC-%Pred-Post: 104 %
FVC-%Change-Post: -4 %
FVC-%PRED-POST: 94 %
FVC-%PRED-PRE: 98 %
FVC-PRE: 2.63 L
FVC-Post: 2.52 L
POST FEV6/FVC RATIO: 100 %
PRE FEV6/FVC RATIO: 100 %
Post FEV1/FVC ratio: 92 %
Pre FEV1/FVC ratio: 90 %
RV % PRED: 55 %
RV: 1.26 L
TLC % PRED: 71 %
TLC: 3.85 L

## 2017-01-17 NOTE — Progress Notes (Signed)
@Patient  ID: [name], female    DOB: [birth year], 66 y.o.   MRN: [id number]  Chief Complaint  Patient presents with  . Follow-up    Bronchiectasis     Referring provider: Abner Greenspan, MD  HPI: 66 yo female former smoker  followed for Bronchiectasis and OSA   TEST  Sleep tests PSG 11/29/08 >> AHI 9 HST 11/10/15 >> AHI 7.1, SaO2 low 75% Auto CPAP 08/12/16 to 09/10/16 >>used on 29 of 30 nights with average 4 hrs 34 min. Average AHI 0.7 with median CPAP 7 and 95 th percentile CPAP 9 cm H2O  Pulmonary tests CT chest 04/10/12 >> b/l lower lobe cylindrical BTX and some in upper lobes PFT 04/17/12 >> FEV1 2.60 (110%), FEV1% 85, TLC 4.05 (77%), DLCO 44% CT angio chest 10/26/15 >> patchy GGO in periphery of lungs b/l, basilar BTX, no PE; no significant change compared to 2013  Cardiac tests Echo 08/24/16 >> EF 55 to 60%, grade 1 DD, mild/mod TR  01/17/2017 Follow up : OSA /Bronchiectasis  Patient presents for a two-month follow-up. Patient has known obstructive sleep apnea on C Pap. Says she is doing well.  Download shows good compliance. Average usage around 6 hours. AHI 0.6. Minimum leaks.  Last visit. Patient was complaining of some increased shortness of breath with activity .Patient does have known bilateral lower lobe bronchiectasis and some bronchiectasis in the upper lobes. PFT today shows an FEV1 at 113%, ratio 90, FVC 98%, DLCO 58%. This is improved since 2013. Pt has had several CT chest over the years (dating back to 2007) that show scarring , chronic interstitial changes . Pt says she was told she might have sarcoid as well.  Most recent CT chest done 2017 showed similar changes with patchy periphreal GGO in lung .  She denies chest pain, cough , wheezing or fever. She does get some sob on occasion but says it does not stop her from doing her activiites.     Allergies  Allergen Reactions  . Ace Inhibitors Swelling    Angioedema; makes tongue "break  out" Angioedema; makes tongue "break out"  . Atorvastatin Other (See Comments)    SEVERE MYALGIAS, MUSCLE PAIN SEVERE MYALGIAS, MUSCLE PAIN  . Ramipril Other (See Comments)    TONGUE ULCERS TONGUE ULCERS  . Rosuvastatin Other (See Comments)    MYALGIAS, SEVERE MUSCLE PAIN MYALGIAS, SEVERE MUSCLE PAIN  . Amitriptyline Hcl Other (See Comments)    Unknown  . Amitriptyline Hcl Other (See Comments)    Unknown  . Iodine-131 Other (See Comments)  . Paroxetine Other (See Comments)    UNKNOWN UNKNOWN  . Carbamazepine Rash    REACTION: ? reaction REACTION: ? reaction  . Cetirizine Hcl Other (See Comments)    REACTION: sleepy  . Cetirizine Hcl Other (See Comments)    REACTION: sleepy  . Codeine Itching  . Dilantin [Phenytoin Sodium Extended] Rash  . Phenytoin Sodium Extended Rash    Immunization History  Administered Date(s) Administered  . H1N1 07/24/2008  . Influenza Split 07/17/2011, 09/27/2011, 04/29/2012  . Influenza Whole 07/20/2004, 07/04/2007, 05/07/2008, 05/19/2009, 05/17/2010  . Influenza,inj,Quad PF,36+ Mos 05/20/2013, 05/06/2014, 05/17/2015, 05/15/2016  . Pneumococcal Conjugate-13 11/22/2015  . Pneumococcal Polysaccharide-23 05/20/2002, 05/07/2008, 06/25/2014  . Td 09/07/2009  . Zoster 09/21/2011    Past Medical History:  Diagnosis Date  . AICD (automatic cardioverter/defibrillator) present   . Anxiety   . Arthritis   . Breast cancer ([place]) 2016   DCIS ER-/PR-  .  Cerebral aneurysm, nonruptured   . Clostridium difficile infection   . Depressive disorder, not elsewhere classified   . Diverticulosis   . Diverticulosis of colon (without mention of hemorrhage)   . Esophageal reflux   . Fibromyalgia   . Gastritis   . GERD (gastroesophageal reflux disease)   . GI bleed   . Hiatal hernia   . Hiatal hernia   . History of hiatal hernia   . Hyperlipidemia   . Hypertension   . Hypothyroidism   . Internal hemorrhoids   . Obstructive sleep apnea (adult)  (pediatric)   . Ostium secundum type atrial septal defect   . Other chronic nonalcoholic liver disease   . Other pulmonary embolism and infarction   . Paroxysmal ventricular tachycardia ([place])   . Personal history of radiation therapy   . Pneumonia    history  . Presence of permanent cardiac pacemaker   . PUD (peptic ulcer disease)   . Radiation 02/03/15-03/10/15   Right Breast  . Sarcoid   . Stroke [facility]) 2013   tia  . Takotsubo syndrome   . Unspecified transient cerebral ischemia   . Unspecified vitamin D deficiency     Tobacco History: History  Smoking Status  . Former Smoker  . Packs/day: 1.00  . Years: 15.00  . Types: Cigarettes  . Quit date: 08/20/1989  Smokeless Tobacco  . Never Used    Comment: Started at 32; less than 1 PPD   Counseling given: Not Answered   Outpatient Encounter Prescriptions as of 01/17/2017  Medication Sig  . acetaminophen (TYLENOL) 500 MG tablet Take 500 mg by mouth every 6 (six) hours as needed (pain).   [name] aspirin 81 MG tablet Take 81 mg by mouth daily.  . baclofen (LIORESAL) 10 MG tablet Take 1 tablet (10 mg total) by mouth 3 (three) times daily.  . cholecalciferol (VITAMIN D) 1000 UNITS tablet Take 2,000 Units by mouth daily.  . fexofenadine (ALLEGRA) 180 MG tablet Take 180 mg by mouth daily as needed for allergies or rhinitis.  . fluticasone (FLONASE) 50 MCG/ACT nasal spray Place 2 sprays into both nostrils daily as needed for allergies or rhinitis.  . hydrochlorothiazide (HYDRODIURIL) 25 MG tablet Take 1 tablet (25 mg total) by mouth daily.  [name] latanoprost (XALATAN) 0.005 % ophthalmic solution Place 1 drop into both eyes at bedtime.  [name] levothyroxine (SYNTHROID, LEVOTHROID) 50 MCG tablet Take 1 tablet (50 mcg total) by mouth daily.  . metoprolol (LOPRESSOR) 50 MG tablet Take 1 tablet (50 mg total) by mouth daily as needed.  . Metoprolol Tartrate 75 MG TABS Take 75 mg by mouth 2 (two) times daily.  . montelukast (SINGULAIR) 10 MG tablet Take 1  tablet (10 mg total) by mouth at bedtime.  . Multiple Vitamins-Minerals (MULTIVITAMIN PO) Take 1 tablet by mouth daily.  . pantoprazole (PROTONIX) 40 MG tablet Take 1 tablet (40 mg total) by mouth 2 (two) times daily before a meal.  . polyethylene glycol powder (GLYCOLAX/MIRALAX) powder Take 17 grams by mouth daily  . potassium chloride (K-DUR) 10 MEQ tablet TAKE 1/2 TABLET (5MEQ TOTAL) EVERY DAY  . simvastatin (ZOCOR) 40 MG tablet Take 0.5 tablets (20 mg total) by mouth daily.  [name] zolpidem (AMBIEN) 10 MG tablet TAKE ONE TABLET BY MOUTH AT BEDTIME AS NEEDED FOR SLEEP   No facility-administered encounter medications on file as of 01/17/2017.      Review of Systems  Constitutional:   No  weight loss, night sweats,  Fevers, chills,  +  fatigue, or  lassitude.  HEENT:   No headaches,  Difficulty swallowing,  Tooth/dental problems, or  Sore throat,                No sneezing, itching, ear ache, nasal congestion, post nasal drip,   CV:  No chest pain,  Orthopnea, PND, swelling in lower extremities, anasarca, dizziness, palpitations, syncope.   GI  No heartburn, indigestion, abdominal pain, nausea, vomiting, diarrhea, change in bowel habits, loss of appetite, bloody stools.   Resp  No chest wall deformity  Skin: no rash or lesions.  GU: no dysuria, change in color of urine, no urgency or frequency.  No flank pain, no hematuria   MS:  No joint pain or swelling.  No decreased range of motion.  No back pain.    Physical Exam  BP 124/70 (BP Location: Left Arm, Cuff Size: Normal)   Pulse 62   Ht 5' 6.25" (1.683 m)   Wt 216 lb 12.8 oz (98.3 kg)   LMP  (LMP Unknown)   SpO2 98%   BMI 34.73 kg/m   GEN: A/Ox3; pleasant , NAD, obese    HEENT:  [place]/AT,  EACs-clear, TMs-wnl, NOSE-clear, THROAT-clear, no lesions, no postnasal drip or exudate noted.   NECK:  Supple w/ fair ROM; no JVD; normal carotid impulses w/o bruits; no thyromegaly or nodules palpated; no lymphadenopathy.    RESP  Clear   P & A; w/o, wheezes/ rales/ or rhonchi. no accessory muscle use, no dullness to percussion  CARD:  RRR, no m/r/g, no peripheral edema, pulses intact, no cyanosis or clubbing.  GI:   Soft & nt; nml bowel sounds; no organomegaly or masses detected.   Musco: Warm bil, no deformities or joint swelling noted.   Neuro: alert, no focal deficits noted.    Skin: Warm, no lesions or rashes    Lab Results:  CBC    Component Value Date/Time   WBC 4.0 11/26/2016 1029   RBC 4.42 11/26/2016 1029   HGB 13.0 11/26/2016 1029   HGB 12.4 12/01/2014 0845   HCT 39.6 11/26/2016 1029   HCT 38.8 12/01/2014 0845   PLT 213.0 11/26/2016 1029   PLT 213 12/01/2014 0845   MCV 89.6 11/26/2016 1029   MCV 88.5 12/01/2014 0845   MCH 28.9 06/14/2016 1520   MCHC 32.7 11/26/2016 1029   RDW 12.8 11/26/2016 1029   RDW 12.7 12/01/2014 0845   LYMPHSABS 0.9 11/26/2016 1029   LYMPHSABS 1.0 12/01/2014 0845   MONOABS 0.4 11/26/2016 1029   MONOABS 0.3 12/01/2014 0845   EOSABS 0.2 11/26/2016 1029   EOSABS 0.3 12/01/2014 0845   EOSABS 0.1 03/13/2007 1249   BASOSABS 0.1 11/26/2016 1029   BASOSABS 0.0 12/01/2014 0845    BMET    Component Value Date/Time   NA 140 11/26/2016 1029   NA 141 12/01/2014 0847   K 3.9 11/26/2016 1029   K 3.8 12/01/2014 0847   CL 105 11/26/2016 1029   CO2 31 11/26/2016 1029   CO2 26 12/01/2014 0847   GLUCOSE 105 (H) 11/26/2016 1029   GLUCOSE 175 (H) 12/01/2014 0847   BUN 10 11/26/2016 1029   BUN 10.6 12/01/2014 0847   CREATININE 0.84 11/26/2016 1029   CREATININE 0.70 08/16/2016 1452   CREATININE 0.8 12/01/2014 0847   CALCIUM 9.9 11/26/2016 1029   CALCIUM 9.8 12/01/2014 0847   GFRNONAA >60 02/14/2016 0618   GFRAA >60 02/14/2016 0618    BNP    Component Value Date/Time  BNP 30.2 08/16/2016 1452    ProBNP No results found for: PROBNP  Imaging: Mm Diag Breast Tomo Bilateral  Result Date: 01/04/2017 CLINICAL DATA:  Right lumpectomy with radiation therapy in 2016.  Patient describes diffuse pain in the lower portion of the right breast. EXAM: 2D DIGITAL DIAGNOSTIC BILATERAL MAMMOGRAM WITH CAD AND ADJUNCT TOMO COMPARISON:  12/02/2015 and earlier ACR Breast Density Category b: There are scattered areas of fibroglandular density. FINDINGS: No suspicious mass, distortion, or microcalcifications are identified to suggest presence of malignancy. Post operative changes are seen in the rightbreast. Mammographic images were processed with CAD. IMPRESSION: No mammographic evidence for malignancy. RECOMMENDATION: Diagnostic mammogram is suggested in 1 year. (Code:DM-B-01Y) I have discussed the findings and recommendations with the patient. Results were also provided in writing at the conclusion of the visit. If applicable, a reminder letter will be sent to the patient regarding the next appointment. BI-RADS CATEGORY  2: Benign. Electronically Signed   By: Nolon Nations M.D.   On: 01/04/2017 14:47     Assessment & Plan:   Obstructive sleep apnea Well controlled on CPAP .   Bronchiectasis without acute exacerbation Stable without flare  CT chest 2017 w/out sign change.  PFT today is stable with improved DLCO .  Hold off on additional CT chest at this time as she has had several scans over the years.   Plan  Patient Instructions  Continue on CPAP At bedtime   Work on weight as able.  Do not drive if sleepy .  Continue on Singulair, Flonase , Allegra.  Follow up Dr. Halford Chessman  In 4 months and As needed   Please contact office for sooner follow up if symptoms do not improve or worsen or seek emergency care         Rexene Edison, NP 01/17/2017

## 2017-01-17 NOTE — Progress Notes (Signed)
PFT done today. 

## 2017-01-17 NOTE — Assessment & Plan Note (Signed)
Well controlled on CPAP. 

## 2017-01-17 NOTE — Patient Instructions (Addendum)
Continue on CPAP At bedtime   Work on weight as able.  Do not drive if sleepy .  Continue on Singulair, Flonase , Allegra.  Follow up Dr. Halford Chessman  In 4 months and As needed   Please contact office for sooner follow up if symptoms do not improve or worsen or seek emergency care

## 2017-01-17 NOTE — Assessment & Plan Note (Signed)
Stable without flare  CT chest 2017 w/out sign change.  PFT today is stable with improved DLCO .  Hold off on additional CT chest at this time as she has had several scans over the years.   Plan  Patient Instructions  Continue on CPAP At bedtime   Work on weight as able.  Do not drive if sleepy .  Continue on Singulair, Flonase , Allegra.  Follow up Dr. Halford Chessman  In 4 months and As needed   Please contact office for sooner follow up if symptoms do not improve or worsen or seek emergency care

## 2017-01-21 NOTE — Progress Notes (Signed)
I have reviewed and agree with assessment/plan.  Angelo Caroll, MD [facility] 01/21/2017, 6:46 AM Pager:  [phone]  

## 2017-01-23 ENCOUNTER — Other Ambulatory Visit: Payer: Self-pay | Admitting: Internal Medicine

## 2017-01-23 DIAGNOSIS — K31A0 Gastric intestinal metaplasia, unspecified: Secondary | ICD-10-CM

## 2017-01-23 DIAGNOSIS — K3189 Other diseases of stomach and duodenum: Secondary | ICD-10-CM

## 2017-01-23 DIAGNOSIS — K2970 Gastritis, unspecified, without bleeding: Secondary | ICD-10-CM

## 2017-02-01 ENCOUNTER — Telehealth: Payer: Self-pay | Admitting: Internal Medicine

## 2017-02-01 NOTE — Telephone Encounter (Signed)
Genetics summary report received from Dr. Broadus John stating:  " I discussed this family with Dr. Caryl Comes and he recommended that it would be prudent to perform routine cardiology evaluation, by echo and EKG for her daughter, her children, and her son's children. If one of these family members have an abnormal imaging screen, it may indicate a genetic condition and genetic testing can be pursued at that time. I agree with his recommendations and informed Ms. Hamil by phone on 01/15/17. She will have her daughter and grandchildren contact [facility] to obtain appropriate routine cardiac evaluation."  I left a message on the patient's voice mail today that I was inquiring if she has spoken with her family members, but she did not need to call me back. I advised if she has and her family would like to pursue screening test, then they could call the office and ask for Dr. Olin Pia nurse.

## 2017-02-15 ENCOUNTER — Telehealth: Payer: Self-pay | Admitting: Nurse Practitioner

## 2017-02-15 MED ORDER — CARVEDILOL 12.5 MG PO TABS: 12.5000 mg | ORAL_TABLET | Freq: Two times a day (BID) | ORAL | 3 refills | Status: DC

## 2017-02-15 NOTE — Telephone Encounter (Signed)
Pt calling to inform Dr Caryl Comes and RN that over the course of a month, she has been experiencing breakthrough palpitations at times.  Pt states when this occurs, she takes her PRN Metoprolol, and then within the hour, she starts to feel sluggish and "wiped out." Pt states that she feels as if she doesn't tolerate metoprolol appropriately, and its not as effective as when she was on carvedilol. Pt states she is currently asymptomatic from a cardiac perspective, and has had no palpitations today.  Pt has not been recording her HR and BP.  Pt would like for Dr Caryl Comes and RN to further advise on what she should do if she experiences further palpitations, and on intolerance to metoprolol.  Advised the pt that if she has a BP cuff at home, when she feels her palpitations and has to utilize her PRN metoprolol, she should monitor her rate and BP within the hour.  Advised the pt that if her BP runs on the lower side, she should increase her fluid intake. Advised the pt to continue her current regimen as is, until further advised.  Informed the pt that I will route this message to Dr Caryl Comes and RN for further review and recommendation, and follow-up with the pt thereafter.  Pt states there is no urgency.  Pt verbalized understanding and agrees with this plan.

## 2017-02-15 NOTE — Telephone Encounter (Signed)
She can go back on carvedilol if she would like Dose 12.5 bid

## 2017-02-15 NOTE — Telephone Encounter (Signed)
Routing to triage. Triage call.

## 2017-02-15 NOTE — Telephone Encounter (Signed)
Patient c/o Palpitations:  High priority if patient c/o lightheadedness and shortness of breath.  1. How long have you been having palpitations? month  2. Are you currently experiencing lightheadedness and shortness of breath? Sometimes but not now  3. Have you checked your BP and heart rate? (document readings) no  4. Are you experiencing any other symptoms? no

## 2017-02-15 NOTE — Telephone Encounter (Signed)
I spoke with the patient and she is aware of Dr. Olin Pia recommendations for carvedilol dosing. This was sent in to the pharmacy per her request.  She will stop metoprolol.

## 2017-02-21 NOTE — Progress Notes (Signed)
Pre-test GC notes  [name] was referred for genetic counseling of non-ischemic cardiomyopathy. We reviewed the different genetic cardiomyopathies that can be considered as non-ischemic cardiomyopathy, namely ARVC (now called AC), DCM and HCM. She was counseled on these cardiomyopathies, namely inheritance, incomplete penetrance, variable expression and digenic/compound mutations that can be seen in some patients. Her medical and 5-generation family history was obtained. See details below  [name] (III.4 on pedigree) is a pleasant 66 year old African American woman. She informs me that she began experiencing heart issues, namely heart palpitations and shortness of breath, about 10-12 years ago. Over time her symptoms have progressively worsened. She does not complain of chest pain or dizzy spells. She had an ICD in 2005 at age 56 with a generator change in 2011 for non-ischemic cardiomyopathy. Her hypertension is currently under control by metoprolol.  Family history [name] (III.4) has three full siblings (III.1-II.3) and a half-sister (III.5). She reports that her older brother (III.1) was diagnosed with a cardiomyopathy at age 68. He was in line for a heart transplant but died of complications form a parathyroid surgery this year at age 68. His son (IV.1) also has some heart issues; she does not know the details about it. Her next brother (III.2) had heart issues as a child and was diagnosed with rheumatic heart disease. He died at 29 of progressive multi-organ failure. His daughter (IV.2), age 109 is currently in good health.   [name]'s older sister (III.3) was diagnosed with a cardiomyopathy at age 39. Due to her heart condition, she was reportedly told not to get pregnant. She had her second child and died 71 months after childbirth. [name] reports that she was a heavy smoker and also had some thyroid issues during her lifetime. She had two children; her older daughter has  some thyroid issues and her son suffers from crippling arthritis.   [name]'s half-sister (III.5) has hypertension and was diagnosed with cardiomyopathy at age 34. Other than heart palpitations, she does not have any other symptoms. Her daughter (IV.7) and granddaughter (V.7) are in good health.  [name] has two kids, a daughter (IV.5) and son (IV.6). Her son, [name] (IV.6) is a patient at [facility] and was diagnosed with non-ischemic cardiomyopathy at age 31. His EF, reportedly, was 20-25% at that time. He suffered a cardiac arrest last year and had an ICD in Dec 2017. She informs me that he is a heavy drinker and has been drinking for the past 20 years. He was drinking alcohol when he suffered the cardiac arrest last year. He has three children (V.4-V.6) and they are currently in good health.  [name]'s daughter (IV.5) and her kids (V.2-V.3) do not have any heart related issues.  [name]'s father (II.6) was also a heavy drinker and reportedly died of heart and kidney disease at 41. Her mother (II.7) had dementia and was diagnosed with Alzheimer's disease. There is no history of heart disease in the first two generations of her family.  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 She states that she was in second grade at that time. Their family was likely exposed to toxic fumes daily from this plant    Impression and Plans  In summary, there is a significant personal and family history of heart disease, namely non-ischemic cardiomyopathy. It may be possible that her family has dilated cardiomyopathy. However, confounding factors of  potential exposure to toxins at a young age and her son suffering from alcohol abuse reduces the mutation yield of such a test. At this juncture, I would not recommend genetic testing for DCM and would like to discuss this family's situation further with Dr. Caryl Comes. Ms. Lapage  was eager to know Dr. Aquilla Hacker' recommendation for a genetic follow-up.    I discussed this family with Dr. Caryl Comes and he recommended that it would be prudent to perform routine cardiology evaluation, by Echo and EKG for her daughter (IV.5), her children (V.2-V.3) and her son's children (V.4-V.6). If one of these family members has an abnormal imaging screen, it may indicate a genetic condition and genetic testing can be pursued at that time. I agree with his recommendation and informed Ms. Marsland by phone on 01/15/17. She will have her daughter and grandchildren contact [facility] to obtain appropriate routine cardiac evaluation.

## 2017-02-22 ENCOUNTER — Ambulatory Visit: Payer: Medicare Other | Admitting: Family Medicine

## 2017-02-22 ENCOUNTER — Other Ambulatory Visit: Payer: Medicare Other

## 2017-02-22 ENCOUNTER — Ambulatory Visit: Payer: Medicare Other | Admitting: Hematology

## 2017-02-22 ENCOUNTER — Ambulatory Visit (INDEPENDENT_AMBULATORY_CARE_PROVIDER_SITE_OTHER): Payer: Medicare Other | Admitting: Family Medicine

## 2017-02-22 ENCOUNTER — Encounter: Payer: Self-pay | Admitting: Family Medicine

## 2017-02-22 VITALS — BP 116/68 | HR 68 | Temp 97.7°F | Ht 66.25 in | Wt 216.8 lb

## 2017-02-22 DIAGNOSIS — L299 Pruritus, unspecified: Secondary | ICD-10-CM

## 2017-02-22 LAB — TSH: TSH: 3.7 u[IU]/mL (ref 0.35–4.50)

## 2017-02-22 LAB — COMPREHENSIVE METABOLIC PANEL
ALBUMIN: 4.1 g/dL (ref 3.5–5.2)
ALT: 35 U/L (ref 0–35)
AST: 33 U/L (ref 0–37)
Alkaline Phosphatase: 107 U/L (ref 39–117)
BUN: 13 mg/dL (ref 6–23)
CALCIUM: 10 mg/dL (ref 8.4–10.5)
CHLORIDE: 104 meq/L (ref 96–112)
CO2: 32 meq/L (ref 19–32)
Creatinine, Ser: 0.89 mg/dL (ref 0.40–1.20)
GFR: 81.02 mL/min (ref >60.00)
Glucose, Bld: 122 mg/dL — ABNORMAL HIGH (ref 70–99)
POTASSIUM: 3.8 meq/L (ref 3.5–5.1)
SODIUM: 141 meq/L (ref 135–145)
Total Bilirubin: 0.4 mg/dL (ref 0.2–1.2)
Total Protein: 7.5 g/dL (ref 6.0–8.3)

## 2017-02-22 MED ORDER — HYDROXYZINE HCL 10 MG PO TABS: 10.0000 mg | ORAL_TABLET | Freq: Three times a day (TID) | ORAL | 0 refills | Status: DC | PRN

## 2017-02-22 NOTE — Patient Instructions (Signed)
Change from allegra to zyrtec 10 mg daily  Use the hydroxyzine px up to three times daily with caution of sedation  Keep cool  Use only non scented products and avoid dryer sheets Labs today for thyroid and liver   We will go from there

## 2017-02-22 NOTE — Progress Notes (Signed)
Subjective:    Patient ID: [name], female    DOB: March 08, 1951, 66 y.o.   MRN: [id number]  HPI Here for c/o of itching "all over"  Wt Readings from Last 3 Encounters:  02/22/17 216 lb 12 oz (98.3 kg)  01/17/17 216 lb 12.8 oz (98.3 kg)  12/05/16 213 lb 12.8 oz (97 kg)   bmi 34.7  Feels like pins are sticking in me  'm itching  Hair itches and is coming out  All skin and head-no internal itching   2-3 weeks No rash Just gets a red line when she scratches  Benadryl (tried 5 d)-helped a little (takes before bed)  Rub down in alcohol  Trying to keep cool/ stay out of the sun   For allergies -takes allegra  Also singulair  Similar thing happened several years ago- tx Dr [facility] office (it worked)  Perhaps she was tx for dermatitis?  Given creams    Has appt with Dr [name] (could not get in before aug)   Lab Results  Component Value Date   TSH 2.71 11/26/2016   Palms of hands do itch -but no rash  Has traveled to [place] a few times - visiting / also had guests No known exp to scabies   Med change - changed metoprolol to carvedilol - this week (itching pre dated it)  No new vitamins or otc meds  No new products   She uses less detergent in clothes Uses tide sensitive det Uses dial and dove soap- long term   No new hair treatment   Lots of stress- ? contrib  Patient Active Problem List   Diagnosis Date Noted  . Itching 02/22/2017  . Primary osteoarthritis of both hands 09/27/2016  . Primary osteoarthritis of both knees 09/27/2016  . Urinary frequency 09/26/2016  . Screen for STD (sexually transmitted disease) 09/26/2016  . Nasopharyngitis, 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/2016  . Postnasal drip 05/18/2016  . S/P total knee replacement 02/13/2016  . Chronic pain of both knees 12/06/2015  . Globus pharyngeus 05/17/2015  . Productive cough 05/17/2015  . Genetic testing 12/23/2014  . Family history of breast cancer   . Family history of colon cancer   . Family  history of pancreatic cancer   . Breast cancer of upper-outer quadrant of right female breast ([place]) 11/23/2014  . Allergy to ACE inhibitors 09/27/2014  . Encounter for Medicare annual wellness exam 06/27/2014  . Hyperglycemia 06/23/2013  . Routine general medical examination at a health care facility 06/23/2013  . Dizziness and giddiness 01/16/2013  . Disturbance of skin sensation 01/16/2013  . RLS (restless legs syndrome) 01/15/2013  . Hx of Clostridium difficile infection 10/10/2012  . Bronchiectasis without acute exacerbation ([place]) 04/29/2012  . Dyspnea on exertion 04/02/2012  . Non-ischemic cardiomyopathy ([place]) 03/07/2012  . Colon cancer screening 01/11/2012  . Post-menopausal 01/11/2012  . Chest pain 11/07/2011  . Palpitations 04/17/2011  . Abnormal tympanic membrane 01/22/2011  . Obstructive sleep apnea 04/04/2010  . VENTRICULAR TACHYCARDIA 01/03/2009  . ICD  [employer]  Single chamber 01/03/2009  . PATENT FORAMEN OVALE 09/29/2008  . Vitamin D deficiency 08/17/2008  . Hypothyroidism 11/18/2006  . Hyperlipidemia 11/18/2006  . DEPRESSION 11/18/2006  . Essential hypertension 11/18/2006  . MITRAL VALVE PROLAPSE 11/18/2006  . CEREBRAL ANEURYSM 11/18/2006  . Allergic rhinitis 11/18/2006  . GERD 11/18/2006  . DIVERTICULOSIS, COLON 11/18/2006  . Fatty liver 11/18/2006  . Fibromyalgia 11/18/2006   Past Medical History:  Diagnosis Date  . AICD (automatic  cardioverter/defibrillator) present   . Anxiety   . Arthritis   . 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 Cerebral aneurysm, nonruptured   . Clostridium difficile infection   . Depressive disorder, not elsewhere classified   . Diverticulosis   . Diverticulosis of colon (without mention of hemorrhage)   . Esophageal reflux   . Fibromyalgia   . Gastritis   . GERD (gastroesophageal reflux disease)   . GI bleed   . Hiatal hernia   . Hiatal hernia   . History of hiatal hernia   . Hyperlipidemia   .  Hypertension   . Hypothyroidism   . Internal hemorrhoids   . Obstructive sleep apnea (adult) (pediatric)   . Ostium secundum type atrial septal defect   . Other chronic nonalcoholic liver disease   . Other pulmonary embolism and infarction   . Paroxysmal ventricular tachycardia ([place])   . Personal history of radiation therapy   . Pneumonia    history  . Presence of permanent cardiac pacemaker   . PUD (peptic ulcer disease)   . Radiation 02/03/15-03/10/15   Right Breast  . Sarcoid   . Stroke [facility]) 2013   tia  . Takotsubo syndrome   . Unspecified transient cerebral ischemia   . Unspecified vitamin D deficiency    Past Surgical History:  Procedure Laterality Date  . ABDOMINAL HYSTERECTOMY    . ABI  2006   normal  . BREAST EXCISIONAL BIOPSY    . BREAST LUMPECTOMY     right breast 2016  . BREAST LUMPECTOMY WITH RADIOACTIVE SEED LOCALIZATION Right 01/03/2015   Procedure: BREAST LUMPECTOMY WITH RADIOACTIVE SEED LOCALIZATION;  Surgeon: Autumn Messing III, MD;  Location: [facility];  Service: General;  Laterality: Right;  . CARDIAC DEFIBRILLATOR PLACEMENT  2006; 2012   BSX single chamber ICD  . carotid dopplers  2006   neg  . CEREBRAL ANEURYSM REPAIR  02/1999  . hospitalization  2004   GI bleed, PUD, diverticulosis (EGD,colonscopy)  . hospitalization     PE, NSVT, s/p defib  . KNEE ARTHROSCOPY     bilateral  . LEFT HEART CATHETERIZATION WITH CORONARY ANGIOGRAM N/A 02/22/2012   Procedure: LEFT HEART CATHETERIZATION WITH CORONARY ANGIOGRAM;  Surgeon: Burnell Blanks, MD;  Location[facility] CATH LAB;  Service: Cardiovascular;  Laterality: N/A;  . PARTIAL HYSTERECTOMY     Fibroids  . TOTAL KNEE ARTHROPLASTY Left 02/13/2016   Procedure: TOTAL KNEE ARTHROPLASTY;  Surgeon: Vickey Huger, MD;  Location: [facility];  Service: Orthopedics;  Laterality: Left;   Social History  Substance Use Topics  . Smoking status: Former Smoker    Packs/day: 1.00    Years: 15.00    Types: Cigarettes    Quit date:  08/20/1989  . Smokeless tobacco: Never Used     Comment: Started at 63; less than 1 PPD  . Alcohol use 0.0 oz/week     Comment: rare   Family History  Problem Relation Age of Onset  . Diabetes Mother   . Alzheimer's disease Mother   . Other Brother        Thyroid problem  . Allergies Sister   . Heart disease Sister   . Heart disease Brother   . Prostate cancer Brother 52       same brother as throat cancer  . Throat cancer Brother        dx in his 49s; also a smoker  . Heart disease Father   . Cancer Maternal Grandmother 29  colon cancer or abdominal cancer  . Lung cancer Maternal Grandfather 38  . Breast cancer Maternal Aunt 11  . Colon cancer Maternal Aunt 54       same sister as breast at 31  . Lung cancer Maternal Uncle   . Breast cancer Maternal Aunt        dx in her 12s  . Pancreatic cancer Cousin 8       maternal first cousin  . Breast cancer Cousin        paternal first cousin twice removed died in her 13s  . Cardiomyopathy Unknown        Family history  . Heart attack Sister   . Hypertension Sister   . Hypertension Brother   . Stroke Neg Hx    Allergies  Allergen Reactions  . Ace Inhibitors Swelling    Angioedema; makes tongue "break out" Angioedema; makes tongue "break out"  . Atorvastatin Other (See Comments)    SEVERE MYALGIAS, MUSCLE PAIN SEVERE MYALGIAS, MUSCLE PAIN  . Ramipril Other (See Comments)    TONGUE ULCERS TONGUE ULCERS  . Rosuvastatin Other (See Comments)    MYALGIAS, SEVERE MUSCLE PAIN MYALGIAS, SEVERE MUSCLE PAIN  . Amitriptyline Hcl Other (See Comments)    Unknown  . Amitriptyline Hcl Other (See Comments)    Unknown  . Iodine-131 Other (See Comments)  . Paroxetine Other (See Comments)    UNKNOWN UNKNOWN  . Carbamazepine Rash    REACTION: ? reaction REACTION: ? reaction  . Cetirizine Hcl Other (See Comments)    REACTION: sleepy  . Cetirizine Hcl Other (See Comments)    REACTION: sleepy  . Codeine Itching  . Dilantin  [Phenytoin Sodium Extended] Rash  . Phenytoin Sodium Extended Rash   Current Outpatient Prescriptions on File Prior to Visit  Medication Sig Dispense Refill  . acetaminophen (TYLENOL) 500 MG tablet Take 500 mg by mouth every 6 (six) hours as needed (pain).     [name] aspirin 81 MG tablet Take 81 mg by mouth daily.    . baclofen (LIORESAL) 10 MG tablet Take 1 tablet (10 mg total) by mouth 3 (three) times daily. 30 each 5  . carvedilol (COREG) 12.5 MG tablet Take 1 tablet (12.5 mg total) by mouth 2 (two) times daily. 180 tablet 3  . cholecalciferol (VITAMIN D) 1000 UNITS tablet Take 2,000 Units by mouth daily.    . fexofenadine (ALLEGRA) 180 MG tablet Take 180 mg by mouth daily as needed for allergies or rhinitis.    . fluticasone (FLONASE) 50 MCG/ACT nasal spray Place 2 sprays into both nostrils daily as needed for allergies or rhinitis. 48 g 3  . hydrochlorothiazide (HYDRODIURIL) 25 MG tablet Take 1 tablet (25 mg total) by mouth daily. 90 tablet 3  . latanoprost (XALATAN) 0.005 % ophthalmic solution Place 1 drop into both eyes at bedtime.    [name] levothyroxine (SYNTHROID, LEVOTHROID) 50 MCG tablet Take 1 tablet (50 mcg total) by mouth daily. 90 tablet 3  . montelukast (SINGULAIR) 10 MG tablet Take 1 tablet (10 mg total) by mouth at bedtime. 90 tablet 3  . Multiple Vitamins-Minerals (MULTIVITAMIN PO) Take 1 tablet by mouth daily.    . pantoprazole (PROTONIX) 40 MG tablet TAKE 1 TABLET BY MOUTH 2 (TWO) TIMES DAILY BEFORE MEALS 180 tablet 0  . polyethylene glycol powder (GLYCOLAX/MIRALAX) powder Take 17 grams by mouth daily 1080 g 0  . potassium chloride (K-DUR) 10 MEQ tablet TAKE 1/2 TABLET (5MEQ TOTAL) EVERY DAY 45 tablet 1  .  simvastatin (ZOCOR) 40 MG tablet Take 0.5 tablets (20 mg total) by mouth daily. 45 tablet 3  . zolpidem (AMBIEN) 10 MG tablet TAKE ONE TABLET BY MOUTH AT BEDTIME AS NEEDED FOR SLEEP 90 tablet 1  . [DISCONTINUED] metoprolol succinate (TOPROL-XL) 50 MG 24 hr tablet Take 2 tablets  (100 mg total) by mouth daily. Take with or immediately following a meal. 90 tablet 3   No current facility-administered medications on file prior to visit.     Review of Systems Review of Systems  Constitutional: Negative for fever, appetite change, fatigue and unexpected weight change.  Eyes: Negative for pain and visual disturbance.  Respiratory: Negative for cough and shortness of breath.   Cardiovascular: Negative for cp or palpitations    Gastrointestinal: Negative for nausea, diarrhea and constipation.  Genitourinary: Negative for urgency and frequency.  Skin: Negative for pallor or rash  pos for itching all over w/o rash Neurological: Negative for weakness, light-headedness, numbness and headaches.  Hematological: Negative for adenopathy. Does not bruise/bleed easily.  Psychiatric/Behavioral: Negative for dysphoric mood. The patient is not nervous/anxious.         Objective:   Physical Exam  Constitutional: She appears well-developed and well-nourished. No distress.  overwt and well app  HENT:  Head: Normocephalic and atraumatic.  Nose: Nose normal.  Mouth/Throat: Oropharynx is clear and moist.  Eyes: Conjunctivae and EOM are normal. Pupils are equal, round, and reactive to light. Right eye exhibits no discharge. Left eye exhibits no discharge.  Neck: Normal range of motion. Neck supple. No thyromegaly present.  Cardiovascular: Normal rate, regular rhythm and normal heart sounds.   Pulmonary/Chest: Effort normal and breath sounds normal. No respiratory distress. She has no wheezes. She has no rales.  Abdominal: Soft. Bowel sounds are normal. She exhibits no distension and no mass. There is no hepatosplenomegaly. There is no tenderness.  Musculoskeletal: She exhibits no edema.  Lymphadenopathy:    She has no cervical adenopathy.  Neurological: She is alert. No cranial nerve deficit. She exhibits normal muscle tone. Coordination normal.  Skin: Skin is warm and dry. No rash  noted. No erythema. No pallor.  No excoriations /rash or eczema  No urticaria   Psychiatric: She has a normal mood and affect.  Vitals reviewed.         Assessment & Plan:   Problem List Items Addressed This Visit      Other   Itching    Pt c/o of itch all over from scalp to toes (not internal) No rash or dry skin  Rev exposures and stressors  Lab today for thyroid and cmet (no hx of liver issues) Px hydroxyzine for prn use with warning of sedation  Change allegra to zyrtec (also warn of sedation) Keep cool  Plan to follow Call for last derm notes       Relevant Orders   Comprehensive metabolic panel (Completed)   TSH (Completed)

## 2017-02-24 NOTE — Assessment & Plan Note (Signed)
Pt c/o of itch all over from scalp to toes (not internal) No rash or dry skin  Rev exposures and stressors  Lab today for thyroid and cmet (no hx of liver issues) Px hydroxyzine for prn use with warning of sedation  Change allegra to zyrtec (also warn of sedation) Keep cool  Plan to follow Call for last derm notes

## 2017-02-25 ENCOUNTER — Telehealth: Payer: Self-pay | Admitting: Family Medicine

## 2017-02-25 DIAGNOSIS — L299 Pruritus, unspecified: Secondary | ICD-10-CM

## 2017-02-25 NOTE — Telephone Encounter (Signed)
-----   Message from Tammi Sou, [place] sent at 02/25/2017 12:42 PM EDT ----- Spoke with pt and advise her of lab results and Dr. Marliss Coots comments off of mychart. Pt said that the hydroxyzine did help the itching but it made her so drowsy that she had to stop taking med. Pt said once she stopped taking it then the itching came right back. Pt doesn't know what to do because she said the med did help but the side eff was just to bad to keep taking it pt said she slept almost all day Saturday after taking Rx. Pt said she did see Dr. Allyson Sabal in 2016 but it was his PA that she saw there but she is sure it was Dr. Ledell Peoples office and she has an appt with him scheduled for Aug 2018 but she just doesn't know what to do in the mean time, please advise

## 2017-02-25 NOTE — Telephone Encounter (Signed)
I'm going to do a derm referral - perhaps [facility] can get her in earlier there or to another practice She can try cutting the hydroxyzine in 1/2 to see if that helps with less sedation as well  Will cc to [facility]

## 2017-02-25 NOTE — Telephone Encounter (Signed)
Pt notified of Dr. Tower's comments and instructions and verbalized understanding  

## 2017-02-26 NOTE — Progress Notes (Signed)
Office Visit Note  Patient: [name]             Date of Birth: [birth year]           MRN: [id number]             PCP: Abner Greenspan, MD Referring: Tower, [name], MD Visit Date: 02/27/2017 Occupation: @GUAROCC @    Subjective:  Pain of the Right Knee  History of Present Illness: [name] is a 66 y.o. female  Last seen 09/28/2016 for fibromyalgia and medication management. On that visit, she stated that her fibromyalgia discomfort was rated 4-5 on a scale of 0-10. She also reported that she was seeing Dr. Ronnie Derby and had a left total knee replacement approximately June 2017. She also stated that she was interested in getting her right total knee replaced sometime in 2018. [facility]asis perhaps 2018).  Chief complaint: Fibromyalgia pain and discomfort and medication management  Today, fms is doing well.  Pt is getting great relief w/ baclofen 10 mg up to 3 times a day given at the last office visit. She is in fact asking for refill on the baclofen and she would like a 90 day supply with 1 refill sent to her mail order pharmacy, [facility]. She reports that she got a mail from [facility] suggesting that they can help her fill that prescription .  Patient is getting injections at the bottom of her right foot from the podiatrist for plantar fasciitis.  She has not gotten her right knee joint replaced by Dr. Ronnie Derby yet but plans to do so in the fall of 2018. She is doing very well with her left knee total knee replacement with Dr. Lorre Nick did last summer. Patient is waiting for the summer to passive that she can get her right knee replaced. She is also having ongoing back pain, left sj pain (c/w bursitis)   Activities of Daily Living:  Patient reports morning stiffness for 15 minutes.   Patient Denies nocturnal pain.  Difficulty dressing/grooming: Denies Difficulty climbing stairs: Denies Difficulty getting out of chair: Denies Difficulty using hands for taps, buttons, cutlery, and/or writing:  Denies   Review of Systems  Constitutional: Positive for fatigue.  HENT: Negative for mouth sores and mouth dryness.   Eyes: Negative for dryness.  Respiratory: Negative for shortness of breath.   Gastrointestinal: Negative for constipation and diarrhea.  Musculoskeletal: Positive for myalgias and myalgias.  Skin: Negative for sensitivity to sunlight.  Psychiatric/Behavioral: Positive for sleep disturbance. Negative for decreased concentration.    PMFS History:  Patient Active Problem List   Diagnosis Date Noted  . Itching 02/22/2017  . Primary osteoarthritis of both hands 09/27/2016  . Primary osteoarthritis of both knees 09/27/2016  . Urinary frequency 09/26/2016  . Screen for STD (sexually transmitted disease) 09/26/2016  . Nasopharyngitis, 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/2016  . Postnasal drip 05/18/2016  . S/P total knee replacement 02/13/2016  . Chronic pain of both knees 12/06/2015  . Globus pharyngeus 05/17/2015  . Productive cough 05/17/2015  . Genetic testing 12/23/2014  . Family history of breast cancer   . Family history of colon cancer   . Family history of pancreatic cancer   . Breast cancer of upper-outer quadrant of right female breast ([place]) 11/23/2014  . Allergy to ACE inhibitors 09/27/2014  . Encounter for Medicare annual wellness exam 06/27/2014  . Hyperglycemia 06/23/2013  . Routine general medical examination at a health care facility 06/23/2013  . Dizziness and giddiness 01/16/2013  .  Disturbance of skin sensation 01/16/2013  . RLS (restless legs syndrome) 01/15/2013  . Hx of Clostridium difficile infection 10/10/2012  . Bronchiectasis without acute exacerbation ([place]) 04/29/2012  . Dyspnea on exertion 04/02/2012  . Non-ischemic cardiomyopathy ([place]) 03/07/2012  . Colon cancer screening 01/11/2012  . Post-menopausal 01/11/2012  . Chest pain 11/07/2011  . Palpitations 04/17/2011  . Abnormal tympanic membrane 01/22/2011  . Obstructive sleep apnea 04/04/2010  .  VENTRICULAR TACHYCARDIA 01/03/2009  . ICD  [employer]  Single chamber 01/03/2009  . PATENT FORAMEN OVALE 09/29/2008  . Vitamin D deficiency 08/17/2008  . Hypothyroidism 11/18/2006  . Hyperlipidemia 11/18/2006  . DEPRESSION 11/18/2006  . Essential hypertension 11/18/2006  . MITRAL VALVE PROLAPSE 11/18/2006  . CEREBRAL ANEURYSM 11/18/2006  . Allergic rhinitis 11/18/2006  . GERD 11/18/2006  . DIVERTICULOSIS, COLON 11/18/2006  . Fatty liver 11/18/2006  . Fibromyalgia 11/18/2006    Past Medical History:  Diagnosis Date  . AICD (automatic cardioverter/defibrillator) present   . Anxiety   . Arthritis   . 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 Cerebral aneurysm, nonruptured   . Clostridium difficile infection   . Depressive disorder, not elsewhere classified   . Diverticulosis   . Diverticulosis of colon (without mention of hemorrhage)   . Esophageal reflux   . Fibromyalgia   . Gastritis   . GERD (gastroesophageal reflux disease)   . GI bleed   . Hiatal hernia   . Hiatal hernia   . History of hiatal hernia   . Hyperlipidemia   . Hypertension   . Hypothyroidism   . Internal hemorrhoids   . Obstructive sleep apnea (adult) (pediatric)   . Ostium secundum type atrial septal defect   . Other chronic nonalcoholic liver disease   . Other pulmonary embolism and infarction   . Paroxysmal ventricular tachycardia ([place])   . Personal history of radiation therapy   . Pneumonia    history  . Presence of permanent cardiac pacemaker   . PUD (peptic ulcer disease)   . Radiation 02/03/15-03/10/15   Right Breast  . Sarcoid   . Stroke [facility]) 2013   tia  . Takotsubo syndrome   . Unspecified transient cerebral ischemia   . Unspecified vitamin D deficiency     Family History  Problem Relation Age of Onset  . Diabetes Mother   . Alzheimer's disease Mother   . Other Brother        Thyroid problem  . Allergies Sister   . Heart disease Sister   . Heart disease Brother   .  Prostate cancer Brother 28       same brother as throat cancer  . Throat cancer Brother        dx in his 38s; also a smoker  . Heart disease Father   . Cancer Maternal Grandmother 61       colon cancer or abdominal cancer  . Lung cancer Maternal Grandfather 71  . Breast cancer Maternal Aunt 44  . Colon cancer Maternal Aunt 21       same sister as breast at 27  . Lung cancer Maternal Uncle   . Breast cancer Maternal Aunt        dx in her 72s  . Pancreatic cancer Cousin 89       maternal first cousin  . Breast cancer Cousin        paternal first cousin twice removed died in her 74s  . Cardiomyopathy Unknown  Family history  . Heart attack Sister   . Hypertension Sister   . Hypertension Brother   . Stroke Neg Hx    Past Surgical History:  Procedure Laterality Date  . ABDOMINAL HYSTERECTOMY    . ABI  2006   normal  . BREAST EXCISIONAL BIOPSY    . BREAST LUMPECTOMY     right breast 2016  . BREAST LUMPECTOMY WITH RADIOACTIVE SEED LOCALIZATION Right 01/03/2015   Procedure: BREAST LUMPECTOMY WITH RADIOACTIVE SEED LOCALIZATION;  Surgeon: Autumn Messing III, MD;  Location: [place];  Service: General;  Laterality: Right;  . CARDIAC DEFIBRILLATOR PLACEMENT  2006; 2012   BSX single chamber ICD  . carotid dopplers  2006   neg  . CEREBRAL ANEURYSM REPAIR  02/1999  . hospitalization  2004   GI bleed, PUD, diverticulosis (EGD,colonscopy)  . hospitalization     PE, NSVT, s/p defib  . KNEE ARTHROSCOPY     bilateral  . LEFT HEART CATHETERIZATION WITH CORONARY ANGIOGRAM N/A 02/22/2012   Procedure: LEFT HEART CATHETERIZATION WITH CORONARY ANGIOGRAM;  Surgeon: Burnell Blanks, MD;  Location: [facility] CATH LAB;  Service: Cardiovascular;  Laterality: N/A;  . PARTIAL HYSTERECTOMY     Fibroids  . TOTAL KNEE ARTHROPLASTY Left 02/13/2016   Procedure: TOTAL KNEE ARTHROPLASTY;  Surgeon: Vickey Huger, MD;  Location: [place];  Service: Orthopedics;  Laterality: Left;   Social History   Social History  Narrative   Retired from [employer]      2 children      Lives alone      Divorced     Objective: Vital Signs: BP 130/76   Pulse 74   Resp 16   Ht 5' 6.5" (1.689 m)   Wt 216 lb (98 kg)   LMP  (LMP Unknown)   BMI 34.34 kg/m    Physical Exam  Constitutional: She is oriented to person, place, and time. She appears well-developed and well-nourished.  HENT:  Head: Normocephalic and atraumatic.  Eyes: EOM are normal. Pupils are equal, round, and reactive to light.  Cardiovascular: Normal rate, regular rhythm and normal heart sounds.  Exam reveals no gallop and no friction rub.   No murmur heard. Pulmonary/Chest: Effort normal and breath sounds normal. She has no wheezes. She has no rales.  Abdominal: Soft. Bowel sounds are normal. She exhibits no distension. There is no tenderness. There is no guarding. No hernia.  Musculoskeletal: Normal range of motion. She exhibits no edema, tenderness or deformity.  Lymphadenopathy:    She has no cervical adenopathy.  Neurological: She is alert and oriented to person, place, and time. Coordination normal.  Skin: Skin is warm and dry. Capillary refill takes less than 2 seconds. No rash noted.  Psychiatric: She has a normal mood and affect. Her behavior is normal.  Nursing note and vitals reviewed.    Musculoskeletal Exam:  Full range of motion of all joints except left shoulder has decreased range of motion secondary to only about 30 of abduction of the left shoulder joint secondary to probably bursitis. Note the patient can use the opposite hand to lift up her left arm and she is able to push the shoulder up higher with abduction of about 60. Grip strength is equal and strong bilaterally Fiber mild tender points are  CDAI Exam: CDAI Homunculus Exam:   Joint Counts:  CDAI Tender Joint count: 0 CDAI Swollen Joint count: 0     Investigation: No additional findings. Office Visit on 02/22/2017  Component Date  Value Ref Range  Status  . Sodium 02/22/2017 141  135 - 145 mEq/L Final  . Potassium 02/22/2017 3.8  3.5 - 5.1 mEq/L Final  . Chloride 02/22/2017 104  96 - 112 mEq/L Final  . CO2 02/22/2017 32  19 - 32 mEq/L Final  . Glucose, Bld 02/22/2017 122* 70 - 99 mg/dL Final  . BUN 02/22/2017 13  6 - 23 mg/dL Final  . Creatinine, Ser 02/22/2017 0.89  0.40 - 1.20 mg/dL Final  . Total Bilirubin 02/22/2017 0.4  0.2 - 1.2 mg/dL Final  . Alkaline Phosphatase 02/22/2017 107  39 - 117 U/L Final  . AST 02/22/2017 33  0 - 37 U/L Final  . ALT 02/22/2017 35  0 - 35 U/L Final  . Total Protein 02/22/2017 7.5  6.0 - 8.3 g/dL Final  . Albumin 02/22/2017 4.1  3.5 - 5.2 g/dL Final  . Calcium 02/22/2017 10.0  8.4 - 10.5 mg/dL Final  . GFR 02/22/2017 81.02  >60.00 mL/min Final  . TSH 02/22/2017 3.70  0.35 - 4.50 uIU/mL Final  Clinical Support on 01/17/2017  Component Date Value Ref Range Status  . FVC-Pre 01/17/2017 2.63  L Final  . FVC-%Pred-Pre 01/17/2017 98  % Final  . FVC-Post 01/17/2017 2.52  L Final  . FVC-%Pred-Post 01/17/2017 94  % Final  . FVC-%Change-Post 01/17/2017 -4  % Final  . FEV1-Pre 01/17/2017 2.38  L Final  . FEV1-%Pred-Pre 01/17/2017 113  % Final  . FEV1-Post 01/17/2017 2.32  L Final  . FEV1-%Pred-Post 01/17/2017 111  % Final  . FEV1-%Change-Post 01/17/2017 -2  % Final  . FEV6-Pre 01/17/2017 2.63  L Final  . FEV6-%Pred-Pre 01/17/2017 102  % Final  . FEV6-Post 01/17/2017 2.52  L Final  . FEV6-%Pred-Post 01/17/2017 97  % Final  . FEV6-%Change-Post 01/17/2017 -4  % Final  . Pre FEV1/FVC ratio 01/17/2017 90  % Final  . FEV1FVC-%Pred-Pre 01/17/2017 115  % Final  . Post FEV1/FVC ratio 01/17/2017 92  % Final  . FEV1FVC-%Change-Post 01/17/2017 1  % Final  . Pre FEV6/FVC Ratio 01/17/2017 100  % Final  . FEV6FVC-%Pred-Pre 01/17/2017 104  % Final  . Post FEV6/FVC ratio 01/17/2017 100  % Final  . FEV6FVC-%Pred-Post 01/17/2017 104  % Final  . FEF 25-75 Pre 01/17/2017 4.14  L/sec Final  . FEF2575-%Pred-Pre  01/17/2017 215  % Final  . FEF 25-75 Post 01/17/2017 3.95  L/sec Final  . FEF2575-%Pred-Post 01/17/2017 205  % Final  . FEF2575-%Change-Post 01/17/2017 -4  % Final  . RV 01/17/2017 1.26  L Final  . RV % pred 01/17/2017 55  % Final  . TLC 01/17/2017 3.85  L Final  . TLC % pred 01/17/2017 71  % Final  . DLCO unc 01/17/2017 15.90  ml/min/mmHg Final  . DLCO unc % pred 01/17/2017 58  % Final  . DLCO cor 01/17/2017 15.80  ml/min/mmHg Final  . DLCO cor % pred 01/17/2017 57  % Final  . DL/VA 01/17/2017 4.09  ml/min/mmHg/L Final  . DL/VA % pred 01/17/2017 80  % Final  Office Visit on 12/05/2016  Component Date Value Ref Range Status  . Pulse Generator Manufacturer 12/05/2016 BOST   Final  . Date Time Interrogation Session 12/05/2016 [id number]   Final  . Pulse Gen Model 12/05/2016 E102 TELIGEN 100   Final  . Pulse Gen Serial Number 12/05/2016 [id number]   Final  . Clinic Name 12/05/2016 [facility]   Final  . Implantable Pulse Generator Type 12/05/2016 Implantable  Cardiac Defibulator   Final  . Implantable Pulse Generator Implan* 12/05/2016 [id number]   Final  . Implantable Lead Manufacturer 12/05/2016 GUIC   Final  . Implantable Lead Model 12/05/2016 [address] [employer] [employer] G   Final  . Implantable Lead Serial Number 12/05/2016 [id number]   Final  . Implantable Lead Implant Date 12/05/2016 [id number]   Final  . Implantable Lead Location Detail 1 12/05/2016 APEX   Final  . Implantable Lead Location 12/05/2016 [id number]   Final  Clinical Support on 11/26/2016  Component Date Value Ref Range Status  . WBC 11/26/2016 4.0  4.0 - 10.5 K/uL Final  . RBC 11/26/2016 4.42  3.87 - 5.11 Mil/uL Final  . Hemoglobin 11/26/2016 13.0  12.0 - 15.0 g/dL Final  . HCT 11/26/2016 39.6  36.0 - 46.0 % Final  . MCV 11/26/2016 89.6  78.0 - 100.0 fl Final  . MCHC 11/26/2016 32.7  30.0 - 36.0 g/dL Final  . RDW 11/26/2016 12.8  11.5 - 15.5 % Final  . Platelets 11/26/2016 213.0  150.0 - 400.0 K/uL Final  . Neutrophils  Relative % 11/26/2016 59.6  43.0 - 77.0 % Final  . Lymphocytes Relative 11/26/2016 23.9  12.0 - 46.0 % Final  . Monocytes Relative 11/26/2016 9.9  3.0 - 12.0 % Final  . Eosinophils Relative 11/26/2016 5.3* 0.0 - 5.0 % Final  . Basophils Relative 11/26/2016 1.3  0.0 - 3.0 % Final  . Neutro Abs 11/26/2016 2.4  1.4 - 7.7 K/uL Final  . Lymphs Abs 11/26/2016 0.9  0.7 - 4.0 K/uL Final  . Monocytes Absolute 11/26/2016 0.4  0.1 - 1.0 K/uL Final  . Eosinophils Absolute 11/26/2016 0.2  0.0 - 0.7 K/uL Final  . Basophils Absolute 11/26/2016 0.1  0.0 - 0.1 K/uL Final  . Sodium 11/26/2016 140  135 - 145 mEq/L Final  . Potassium 11/26/2016 3.9  3.5 - 5.1 mEq/L Final  . Chloride 11/26/2016 105  96 - 112 mEq/L Final  . CO2 11/26/2016 31  19 - 32 mEq/L Final  . Glucose, Bld 11/26/2016 105* 70 - 99 mg/dL Final  . BUN 11/26/2016 10  6 - 23 mg/dL Final  . Creatinine, Ser 11/26/2016 0.84  0.40 - 1.20 mg/dL Final  . Total Bilirubin 11/26/2016 0.4  0.2 - 1.2 mg/dL Final  . Alkaline Phosphatase 11/26/2016 112  39 - 117 U/L Final  . AST 11/26/2016 25  0 - 37 U/L Final  . ALT 11/26/2016 27  0 - 35 U/L Final  . Total Protein 11/26/2016 7.4  6.0 - 8.3 g/dL Final  . Albumin 11/26/2016 4.0  3.5 - 5.2 g/dL Final  . Calcium 11/26/2016 9.9  8.4 - 10.5 mg/dL Final  . GFR 11/26/2016 86.67  >60.00 mL/min Final  . Hgb A1c MFr Bld 11/26/2016 5.5  4.6 - 6.5 % Final   Glycemic Control Guidelines for People with Diabetes:Non Diabetic:  <6%Goal of Therapy: <7%Additional Action Suggested:  >8%   . Cholesterol 11/26/2016 172  0 - 200 mg/dL Final   ATP III Classification       Desirable:  < 200 mg/dL               Borderline High:  200 - 239 mg/dL          High:  > = 240 mg/dL  . Triglycerides 11/26/2016 135.0  0.0 - 149.0 mg/dL Final   Normal:  <150 mg/dLBorderline High:  150 - 199 mg/dL  . HDL 11/26/2016 48.50  >39.00 mg/dL Final  .  VLDL 11/26/2016 27.0  0.0 - 40.0 mg/dL Final  . LDL Cholesterol 11/26/2016 96  0 - 99 mg/dL  Final  . Total CHOL/HDL Ratio 11/26/2016 4   Final                  Men          Women1/2 Average Risk     3.4          3.3Average Risk          5.0          4.42X Average Risk          9.6          7.13X Average Risk          15.0          11.0                      . NonHDL 11/26/2016 123.37   Final   NOTE:  Non-HDL goal should be 30 mg/dL higher than patient's LDL goal (i.e. LDL goal of < 70 mg/dL, would have non-HDL goal of < 100 mg/dL)  . TSH 11/26/2016 2.71  0.35 - 4.50 uIU/mL Final  . VITD 11/26/2016 44.83  30.00 - 100.00 ng/mL Final  Office Visit on 09/26/2016  Component Date Value Ref Range Status  . Organism ID, Bacteria 09/26/2016 NO GROWTH   Final  . Color, UA 09/26/2016 yellow   Final  . Clarity, UA 09/26/2016 clear   Final  . Glucose, UA 09/26/2016 negative   Final  . Bilirubin, UA 09/26/2016 negative   Final  . Ketones, UA 09/26/2016 ngetive   Final  . Spec Grav, UA 09/26/2016 1.025   Final  . Blood, UA 09/26/2016 negative   Final  . pH, UA 09/26/2016 7.0   Final  . Protein, UA 09/26/2016 negative   Final  . Urobilinogen, UA 09/26/2016 negative   Final  . Nitrite, UA 09/26/2016 negative   Final  . Leukocytes, UA 09/26/2016 Trace* Negative Final   15 Leu/uL  . CT Probe RNA 09/26/2016 NOT DETECTED   Final   Comment:                    **Normal Reference Range: NOT DETECTED**   This test was performed using the APTIMA COMBO2 Assay ([employer]   The analytical performance characteristics of this assay, when used to test SurePath specimens have been determined by [employer]     . GC Probe RNA 09/26/2016 NOT DETECTED   Final   Comment:                    **Normal Reference Range: NOT DETECTED**   This test was performed using the APTIMA COMBO2 Assay ([employer]   The analytical performance characteristics of this assay, when used to test SurePath specimens have been determined by [employer]     . Source Wet Prep POC 09/26/2016 vaginal    Final  . WBC, Wet Prep HPF POC 09/26/2016 many   Final  . Bacteria Wet Prep HPF POC 09/26/2016 Moderate* Few Final  . Clue Cells Wet Prep HPF POC 09/26/2016 None  None Final  . Yeast Wet Prep HPF POC 09/26/2016 None   Final  . KOH Wet Prep POC 09/26/2016 neg   Final  . Trichomonas Wet Prep HPF POC 09/26/2016 Present* Absent Final     Imaging: No results found.  Speciality Comments:  No specialty comments available.    Procedures:  Large Joint Inj Date/Time: 02/27/2017 2:47 PM Performed by: Eliezer Lofts Authorized by: Eliezer Lofts   Consent Given by:  Patient Site marked: the procedure site was marked   Timeout: prior to procedure the correct patient, procedure, and site was verified   Indications:  Pain Location:  Shoulder Site:  L glenohumeral Prep: patient was prepped and draped in usual sterile fashion   Needle Size:  27 G Needle Length:  1.5 inches Approach:  Posterior Ultrasound Guidance: No   Fluoroscopic Guidance: No   Arthrogram: No   Medications:  40 mg triamcinolone acetonide 40 MG/ML; 2 mL lidocaine 2 % Aspiration Attempted: Yes   Aspirate amount (mL):  0 Patient tolerance:  Patient tolerated the procedure well with no immediate complications   Allergies: Ace inhibitors; Atorvastatin; Ramipril; Rosuvastatin; Amitriptyline hcl; Amitriptyline hcl; Iodine-131; Paroxetine; Carbamazepine; Cetirizine hcl; Cetirizine hcl; Codeine; Dilantin [phenytoin sodium extended]; and Phenytoin sodium extended   Assessment / Plan:     Visit Diagnoses: Fibromyalgia - Plan: CBC with Differential/Platelet, COMPLETE METABOLIC PANEL WITH GFR  Chronic fatigue  Primary insomnia  Bursitis of left shoulder - Plan: Large Joint Injection/Arthrocentesis  Primary osteoarthritis of both hands  Primary osteoarthritis of both knees  Medication management - Plan: CBC with Differential/Platelet, COMPLETE METABOLIC PANEL WITH GFR  Fatigue, unspecified type - Plan: CBC with  Differential/Platelet, COMPLETE METABOLIC PANEL WITH GFR  Post-menopausal - Plan: CBC with Differential/Platelet, COMPLETE METABOLIC PANEL WITH GFR   Plan: #1: Fibromyalgia. Active disease with generalized pain and 6 out of 18 tender points Currently patient rates her discomfort about 4 and a scale of 0-10. She is doing very well with her baclofen 10 mg. She is able to take it up to 3 times a day and at times she does need that. She requests a refill on baclofen today; ninety-day supply with 1 refill  #2: Fatigue and insomnia. Ongoing.  #3: Left shoulder joint pain. Asian unable to abductor her left shoulder beyond 30. However when she uses her opposite hand, she is able to raise it up much higher and abduction to about 90-100. Consistent with left shoulder joint bursitis. After informed consent was obtained the left shoulder joint was injected with 2 mL's of 2% lidocaine without epinephrine mixed with 40 mg of Kenalog. Patient tolerated procedure well. There are no competitions  #4: Prescription for Voltaren gel. Patient will use good [email or web address] to obtain Voltaren gel at a better price. She states that her insurance will not pay for the medication.  #5: Return to clinic in 5 months.  Orders: Orders Placed This Encounter  Procedures  . Large Joint Injection/Arthrocentesis  . CBC with Differential/Platelet  . COMPLETE METABOLIC PANEL WITH GFR   Meds ordered this encounter  Medications  . baclofen (LIORESAL) 10 MG tablet    Sig: Take 1 tablet (10 mg total) by mouth 3 (three) times daily.    Dispense:  270 tablet    Refill:  1  . diclofenac sodium (VOLTAREN) 1 % GEL    Sig: Voltaren Gel 3 grams to 3 large joints upto TID 3 TUBES with 3 refills    Dispense:  3 Tube    Refill:  3    Voltaren Gel 3 grams to 3 large joints upto TID 3 TUBES with 3 refills    Order Specific Question:   Supervising Provider    Answer:   Bo Merino [phone]    Face-to-face time spent with patient  was 30 minutes. 50% of time was spent in counseling and coordination of care.  Follow-Up Instructions: Return in about 5 months (around 07/30/2017) for FMS, FATIGUE,INSOMNIA,lt sj bursitis.   Eliezer Lofts, PA-C  Note - This record has been created using [employer].  Chart creation errors have been sought, but may not always  have been located. Such creation errors do not reflect on  the standard of medical care.

## 2017-02-26 NOTE — Telephone Encounter (Signed)
Appt moved up and patient aware.

## 2017-02-27 ENCOUNTER — Encounter: Payer: Self-pay | Admitting: Rheumatology

## 2017-02-27 ENCOUNTER — Ambulatory Visit (INDEPENDENT_AMBULATORY_CARE_PROVIDER_SITE_OTHER): Payer: Medicare Other | Admitting: Rheumatology

## 2017-02-27 ENCOUNTER — Ambulatory Visit: Payer: Medicare Other | Admitting: Rheumatology

## 2017-02-27 VITALS — BP 130/76 | HR 74 | Resp 16 | Ht 66.5 in | Wt 216.0 lb

## 2017-02-27 DIAGNOSIS — M19042 Primary osteoarthritis, left hand: Secondary | ICD-10-CM

## 2017-02-27 DIAGNOSIS — F5101 Primary insomnia: Secondary | ICD-10-CM | POA: Diagnosis not present

## 2017-02-27 DIAGNOSIS — Z780 Asymptomatic menopausal state: Secondary | ICD-10-CM

## 2017-02-27 DIAGNOSIS — Z79899 Other long term (current) drug therapy: Secondary | ICD-10-CM

## 2017-02-27 DIAGNOSIS — M19041 Primary osteoarthritis, right hand: Secondary | ICD-10-CM | POA: Diagnosis not present

## 2017-02-27 DIAGNOSIS — M170 Bilateral primary osteoarthritis of knee: Secondary | ICD-10-CM

## 2017-02-27 DIAGNOSIS — R5383 Other fatigue: Secondary | ICD-10-CM | POA: Diagnosis not present

## 2017-02-27 DIAGNOSIS — M7552 Bursitis of left shoulder: Secondary | ICD-10-CM

## 2017-02-27 DIAGNOSIS — M797 Fibromyalgia: Secondary | ICD-10-CM | POA: Diagnosis not present

## 2017-02-27 DIAGNOSIS — R5382 Chronic fatigue, unspecified: Secondary | ICD-10-CM

## 2017-02-27 LAB — COMPLETE METABOLIC PANEL WITH GFR
ALT: 39 U/L — AB (ref 6–29)
AST: 36 U/L — ABNORMAL HIGH (ref 10–35)
Albumin: 4.4 g/dL (ref 3.6–5.1)
Alkaline Phosphatase: 120 U/L (ref 33–130)
BUN: 14 mg/dL (ref 7–25)
CHLORIDE: 103 mmol/L (ref 98–110)
CO2: 27 mmol/L (ref 20–31)
CREATININE: 0.82 mg/dL (ref 0.50–0.99)
Calcium: 10.3 mg/dL (ref 8.6–10.4)
GFR, EST AFRICAN AMERICAN: 85 mL/min (ref >=60)
GFR, Est Non African American: 74 mL/min (ref >=60)
Glucose, Bld: 91 mg/dL (ref 65–99)
POTASSIUM: 4.1 mmol/L (ref 3.5–5.3)
Sodium: 139 mmol/L (ref 135–146)
Total Bilirubin: 0.5 mg/dL (ref 0.2–1.2)
Total Protein: 7.6 g/dL (ref 6.1–8.1)

## 2017-02-27 LAB — CBC WITH DIFFERENTIAL/PLATELET
BASOS PCT: 1 %
Basophils Absolute: 45 cells/uL (ref 0–200)
Eosinophils Absolute: 135 cells/uL (ref 15–500)
Eosinophils Relative: 3 %
HCT: 40.4 % (ref 35.0–45.0)
Hemoglobin: 13.2 g/dL (ref 11.7–15.5)
LYMPHS PCT: 31 %
Lymphs Abs: 1395 cells/uL (ref 850–3900)
MCH: 29.5 pg (ref 27.0–33.0)
MCHC: 32.7 g/dL (ref 32.0–36.0)
MCV: 90.2 fL (ref 80.0–100.0)
MONOS PCT: 7 %
MPV: 10.8 fL (ref 7.5–12.5)
Monocytes Absolute: 315 cells/uL (ref 200–950)
Neutro Abs: 2610 cells/uL (ref 1500–7800)
Neutrophils Relative %: 58 %
PLATELETS: 224 10*3/uL (ref 140–400)
RBC: 4.48 MIL/uL (ref 3.80–5.10)
RDW: 13.3 % (ref 11.0–15.0)
WBC: 4.5 10*3/uL (ref 3.8–10.8)

## 2017-02-27 MED ORDER — BACLOFEN 10 MG PO TABS: 10.0000 mg | ORAL_TABLET | Freq: Three times a day (TID) | ORAL | 1 refills | Status: DC

## 2017-02-27 MED ORDER — DICLOFENAC SODIUM 1 % TD GEL: TRANSDERMAL | 3 refills | Status: DC

## 2017-02-27 MED ORDER — TRIAMCINOLONE ACETONIDE 40 MG/ML IJ SUSP
40.0000 mg | INTRAMUSCULAR | Status: AC | PRN
  Administered 2017-02-27: 40 mg via INTRA_ARTICULAR

## 2017-02-27 MED ORDER — LIDOCAINE HCL 2 % IJ SOLN
2.0000 mL | INTRAMUSCULAR | Status: AC | PRN
  Administered 2017-02-27: 2 mL

## 2017-02-28 NOTE — Progress Notes (Signed)
[name] Follow Up NOTE  Patient Care Team: Tower, [name], MD as PCP - [place], [place], MD as Consulting Physician (General Surgery) Truitt Merle, MD as Consulting Physician (Hematology) Thea Silversmith, MD (Inactive) as Consulting Physician (Radiation Oncology) Rockwell Germany, RN as Registered Nurse Mauro Kaufmann, RN as Registered Nurse Deboraha Sprang, MD as Consulting Physician (Cardiology) Holley Bouche, NP as Nurse Practitioner (Nurse Practitioner) Sylvan Cheese, NP as Nurse Practitioner (Nurse Practitioner) Chesley Mires, MD as Consulting Physician (Pulmonary Disease) Jovita Kussmaul, MD as Consulting Physician (General Surgery) Bo Merino, MD as Consulting Physician (Rheumatology) [address] in [place]. My diagnosis is seen the. No document was patient IS NOT BE AN APPOINTMENT FOR A MORE RELATED TO THE DIAGNOSIS AND TREATMENT ONE THING IS YOUR PATIENT AND PATIENT'S HUSBAND WILL BE GOING TO SEE YOU CAN TELL PATIENT IS A IN A CONSULT FOR WHAT REASON. HE'LL FIRST  THE SECOND  MR. HAVING SOME CONFUSION . AS YOU KNOW THE ADMISSION.  CHIEF COMPLAINTS:  Follow Up right breast DCIS  HISTORY OF PRESENTING ILLNESS:  [name] 66 y.o. female is here because of recent diagnosis of right breast DCIS  The cancer was detected by screening mammogram on 10/28/2014. She underwent a diagnostic mammogram on the next day, which showed a group of microcalcifications in the upper outer quadrant of right breast, spanning 11 mm. The cancer was not palpable prior to diagnosis. She underwent ultrasound-guided needle biopsy on 11/19/2014 which showed high-grade DCIS with calcification and necrosis. ER/PR was 4-6% positive.  I reviewed her records extensively and collaborated the history with the patient.  She has been feeling fatigued for the past few month, but able to function well at home. Her granddaughter lives with her. She has both knee pain from  arthritis, no knee surgery yet. She also has back pain, moderate, she take tramadol, hydrocordone for pains.   SUMMARY OF ONCOLOGIC HISTORY: Oncology History   Breast cancer of upper-outer quadrant of right female breast   Staging form: Breast, AJCC 7th Edition     Clinical stage from 12/01/2014: Stage 0 (Tis (DCIS), N0, M0) - Unsigned        Breast cancer of upper-outer quadrant of right female breast ([place])   10/29/2014 Mammogram    Right breast: microcalcifications noted; need further imaging      11/05/2014 Breast US    Right breast: microcalcifications in the upper outer quadrant, spanning 11 mm      11/19/2014 Initial Biopsy    Right breast core needle bx: DCIS, high grade, with calcificiations and necrosis, ER+ (6%), PR+ (4%).        11/19/2014 Clinical Stage    Stage 0: Tis N0      12/08/2014 Procedure    Genetics: [facility] no clinically sig. variant detected in APC, ATM, BARD1, BMPR1A, BRCA1/2, BRIP1, CDH1, CDK4, CDKN2A, CHEK2, EPCAM, GREM1, MLH1, MRE11A, MSH2/6, MUTYH, NBN, NF1, PALB2, PMS2, POLD1, POLE, PTEN, RAD50, RAD51D, SMAD4, SMARCA4, STK1, TP53      01/03/2015 Definitive Surgery    Right lumpectomy [name]): DCIS with calcifications, high grade, 1.8 cm.  Clear margins.       01/03/2015 Pathologic Stage    Stage 0: pTis pNx      01/20/2015 -  Anti-estrogen oral therapy    Not recommended due to weakly positive hormonal receptors and cardiac history.      02/03/2015 - 03/10/2015 Radiation Therapy    Adjuvant RT completed Pablo Ledger): Right breast  50 Gy over 25 fractions.      05/10/2015 Survivorship    Survivorship visit completed and copy of care plan provided to patient.       In terms of breast cancer risk profile:  She menarched at early age of 44 and went to menopause at age 17 She had 3 pregnancy, her first child was born at age 23 She did no breast-fed her child  She received birth control pills for approximately 3-4 years  She was never exposed to  fertility medications or hormone replacement therapy.  She has family history of Breast and colon cancer  CURRENT THERAPY: Surveillance  INTERIM HISTORY: [name] returns for follow-up.  She was last seen by me a year ago. She denies any new medical problems besides a lung condition which causes shortness of breath. Her last mammogram was in May. She does self exams and she still gets some pain/discomfort under her right breast.    MEDICAL HISTORY:  Past Medical History:  Diagnosis Date  . AICD (automatic cardioverter/defibrillator) present   . Anxiety   . Arthritis   . Breast cancer ([place]) 2016   DCIS ER-/PR-  . Cerebral aneurysm, nonruptured   . Clostridium difficile infection   . Depressive disorder, not elsewhere classified   . Diverticulosis   . Diverticulosis of colon (without mention of hemorrhage)   . Esophageal reflux   . Fibromyalgia   . Gastritis   . GERD (gastroesophageal reflux disease)   . GI bleed   . Hiatal hernia   . Hiatal hernia   . History of hiatal hernia   . Hyperlipidemia   . Hypertension   . Hypothyroidism   . Internal hemorrhoids   . Obstructive sleep apnea (adult) (pediatric)   . Ostium secundum type atrial septal defect   . Other chronic nonalcoholic liver disease   . Other pulmonary embolism and infarction   . Paroxysmal ventricular tachycardia ([place])   . Personal history of radiation therapy   . Pneumonia    history  . Presence of permanent cardiac pacemaker   . PUD (peptic ulcer disease)   . Radiation 02/03/15-03/10/15   Right Breast  . Sarcoid   . Stroke [facility]) 2013   tia  . Takotsubo syndrome   . Unspecified transient cerebral ischemia   . Unspecified vitamin D deficiency     SURGICAL HISTORY: Past Surgical History:  Procedure Laterality Date  . ABDOMINAL HYSTERECTOMY    . ABI  2006   normal  . BREAST EXCISIONAL BIOPSY    . BREAST LUMPECTOMY     right breast 2016  . BREAST LUMPECTOMY WITH RADIOACTIVE SEED LOCALIZATION Right  01/03/2015   Procedure: BREAST LUMPECTOMY WITH RADIOACTIVE SEED LOCALIZATION;  Surgeon: Autumn Messing III, MD;  Location: [facility];  Service: General;  Laterality: Right;  . CARDIAC DEFIBRILLATOR PLACEMENT  2006; 2012   BSX single chamber ICD  . carotid dopplers  2006   neg  . CEREBRAL ANEURYSM REPAIR  02/1999  . hospitalization  2004   GI bleed, PUD, diverticulosis (EGD,colonscopy)  . hospitalization     PE, NSVT, s/p defib  . KNEE ARTHROSCOPY     bilateral  . LEFT HEART CATHETERIZATION WITH CORONARY ANGIOGRAM N/A 02/22/2012   Procedure: LEFT HEART CATHETERIZATION WITH CORONARY ANGIOGRAM;  Surgeon: Burnell Blanks, MD;  Location: [facility] CATH LAB;  Service: Cardiovascular;  Laterality: N/A;  . PARTIAL HYSTERECTOMY     Fibroids  . TOTAL KNEE ARTHROPLASTY Left 02/13/2016   Procedure: TOTAL KNEE  ARTHROPLASTY;  Surgeon: Vickey Huger, MD;  Location: [place];  Service: Orthopedics;  Laterality: Left;    SOCIAL HISTORY: Social History   Social History  . Marital status: Divorced    Spouse name: N/A  . Number of children: 2  . Years of education: N/A   Occupational History  . DISABLED   .  Disability   Social History Main Topics  . Smoking status: Former Smoker    Packs/day: 1.00    Years: 15.00    Types: Cigarettes    Quit date: 08/20/1989  . Smokeless tobacco: Never Used     Comment: Started at 11; less than 1 PPD  . Alcohol use 0.0 oz/week     Comment: rare  . Drug use: No  . Sexual activity: No   Other Topics Concern  . Not on file   Social History Narrative   Retired from [employer]      2 children      Lives alone      Divorced    FAMILY HISTORY: Family History  Problem Relation Age of Onset  . Diabetes Mother   . Alzheimer's disease Mother   . Other Brother        Thyroid problem  . Allergies Sister   . Heart disease Sister   . Heart disease Brother   . Prostate cancer Brother 57       same brother as throat cancer  . Throat cancer Brother        dx in his  31s; also a smoker  . Heart disease Father   . Cancer Maternal Grandmother 15       colon cancer or abdominal cancer  . Lung cancer Maternal Grandfather 85  . Breast cancer Maternal Aunt 56  . Colon cancer Maternal Aunt 77       same sister as breast at 13  . Lung cancer Maternal Uncle   . Breast cancer Maternal Aunt        dx in her 16s  . Pancreatic cancer Cousin 37       maternal first cousin  . Breast cancer Cousin        paternal first cousin twice removed died in her 2s  . Cardiomyopathy Unknown        Family history  . Heart attack Sister   . Hypertension Sister   . Hypertension Brother   . Stroke Neg Hx     ALLERGIES:  is allergic to ace inhibitors; atorvastatin; ramipril; rosuvastatin; amitriptyline hcl; amitriptyline hcl; iodine-131; paroxetine; carbamazepine; cetirizine hcl; cetirizine hcl; codeine; dilantin [phenytoin sodium extended]; and phenytoin sodium extended.  MEDICATIONS:  Current Outpatient Prescriptions  Medication Sig Dispense Refill  . acetaminophen (TYLENOL) 500 MG tablet Take 500 mg by mouth every 6 (six) hours as needed (pain).     [name] aspirin 81 MG tablet Take 81 mg by mouth daily.    . baclofen (LIORESAL) 10 MG tablet Take 1 tablet (10 mg total) by mouth 3 (three) times daily. 270 tablet 1  . carvedilol (COREG) 12.5 MG tablet Take 1 tablet (12.5 mg total) by mouth 2 (two) times daily. 180 tablet 3  . cholecalciferol (VITAMIN D) 1000 UNITS tablet Take 2,000 Units by mouth daily.    . diclofenac sodium (VOLTAREN) 1 % GEL Voltaren Gel 3 grams to 3 large joints upto TID 3 TUBES with 3 refills 3 Tube 3  . fexofenadine (ALLEGRA) 180 MG tablet Take 180 mg by mouth daily as needed for  allergies or rhinitis.    . fluticasone (FLONASE) 50 MCG/ACT nasal spray Place 2 sprays into both nostrils daily as needed for allergies or rhinitis. 48 g 3  . hydrochlorothiazide (HYDRODIURIL) 25 MG tablet Take 1 tablet (25 mg total) by mouth daily. 90 tablet 3  . hydrOXYzine  (ATARAX/VISTARIL) 10 MG tablet Take 1 tablet (10 mg total) by mouth 3 (three) times daily as needed for itching. With caution of sedation (Patient not taking: Reported on 02/27/2017) 30 tablet 0  . latanoprost (XALATAN) 0.005 % ophthalmic solution Place 1 drop into both eyes at bedtime.    [name] levothyroxine (SYNTHROID, LEVOTHROID) 50 MCG tablet Take 1 tablet (50 mcg total) by mouth daily. 90 tablet 3  . Metoprolol Tartrate 75 MG TABS     . mometasone (ELOCON) 0.1 % lotion     . montelukast (SINGULAIR) 10 MG tablet Take 1 tablet (10 mg total) by mouth at bedtime. 90 tablet 3  . Multiple Vitamins-Minerals (MULTIVITAMIN PO) Take 1 tablet by mouth daily.    . pantoprazole (PROTONIX) 40 MG tablet TAKE 1 TABLET BY MOUTH 2 (TWO) TIMES DAILY BEFORE MEALS 180 tablet 0  . polyethylene glycol powder (GLYCOLAX/MIRALAX) powder Take 17 grams by mouth daily 1080 g 0  . potassium chloride (K-DUR) 10 MEQ tablet TAKE 1/2 TABLET (5MEQ TOTAL) EVERY DAY 45 tablet 1  . simvastatin (ZOCOR) 40 MG tablet Take 0.5 tablets (20 mg total) by mouth daily. 45 tablet 3  . zolpidem (AMBIEN) 10 MG tablet TAKE ONE TABLET BY MOUTH AT BEDTIME AS NEEDED FOR SLEEP 90 tablet 1   No current facility-administered medications for this visit.     REVIEW OF SYSTEMS:   Constitutional: Denies fevers, chills or abnormal night sweats Eyes: Denies blurriness of vision, double vision or watery eyes Ears, nose, mouth, throat, and face: Denies mucositis or sore throat Respiratory: Denies cough, dyspnea or wheezes Cardiovascular: Denies palpitation, chest discomfort or lower extremity swelling Gastrointestinal:  Denies nausea, heartburn or change in bowel habits Skin: Denies abnormal skin rashes Lymphatics: Denies new lymphadenopathy or easy bruising Neurological:Denies numbness, tingling or new weaknesses Behavioral/Psych: Mood is stable, no new changes  Breast: (+)pain under right breast All other systems were reviewed with the patient and  are negative.  PHYSICAL EXAMINATION: ECOG PERFORMANCE STATUS: 1 - Symptomatic but completely ambulatory  Vitals:   03/04/17 0900  BP: 132/65  Pulse: 63  Resp: 18  Temp: 98.1 F (36.7 C)   Filed Weights   03/04/17 0900  Weight: 215 lb 1.6 oz (97.6 kg)    GENERAL:alert, no distress and comfortable SKIN: skin color, texture, turgor are normal, no rashes or significant lesions EYES: normal, conjunctiva are pink and non-injected, sclera clear OROPHARYNX:no exudate, no erythema and lips, buccal mucosa, and tongue normal  NECK: supple, thyroid normal size, non-tender, without nodularity LYMPH:  no palpable lymphadenopathy in the cervical, axillary or inguinal LUNGS: clear to auscultation and percussion with normal breathing effort HEART: regular rate & rhythm and no murmurs and no lower extremity edema ABDOMEN:abdomen soft, non-tender and normal bowel sounds Musculoskeletal:no cyanosis of digits and no clubbing  PSYCH: alert & oriented x 3 with fluent speech NEURO: no focal motor/sensory deficits  Breasts: Breast inspection showed them to be symmetrical with no nipple discharge or skin changes. A well-healed surgical scar in the right breast. No palpable mass in the right breast. left breast and axilla exam revealed no obvious mass or node that I could appreciate. Surgical scar in the lateral right  breast with minimum scarred tissue     LABORATORY DATA:  I have reviewed the data as listed Lab Results  Component Value Date   WBC 4.5 02/27/2017   HGB 13.2 02/27/2017   HCT 40.4 02/27/2017   MCV 90.2 02/27/2017   PLT 224 02/27/2017   Lab Results  Component Value Date   NA 139 02/27/2017   K 4.1 02/27/2017   CL 103 02/27/2017   CO2 27 02/27/2017   Pathology report Diagnosis 01/03/2015 Breast, lumpectomy, right - DUCTAL CARCINOMA IN SITU WITH CALCIFICATIONS, HIGH GRADE, SPANNING 1.8 CM. - DUCTAL CARCINOMA IN SITU IS FOCALLY 0.1 CM TO THE MEDIAL MARGIN. - SEE ONCOLOGY TABLE  BELOW. Microscopic Comment BREAST, IN SITU CARCINOMA Specimen, including laterality: Right breast. Procedure (include lymph node sampling sentinel-non-sentinel): Lumpectomy. Grade of carcinoma: High grade. Necrosis: Present. Estimated tumor size: (gross measurement): 1.8 cm Treatment effect: N/A Distance to closest margin: Focally 0.1 cm to the medial margin. Breast prognostic profile: [phone] Estrogen receptor: 6%, weak staining intensity. Progesterone receptor: 4%, weak staining intensity. Lymph nodes: Not examined. TNM: pTis, pNX Comments: In addition to the high grade ductal carcinoma in situ, there is fibrocystic changes with adenosis, usual ductal hyperplasia and calcifications, a fibroadenoma, vascular calcifications, and a healing biopsy site. (JBK:gt, 01/04/15)   RADIOGRAPHIC STUDIES: I have personally reviewed the radiological images as listed and agreed with the findings in the report.  Mammogram 01/04/2017 IMPRESSION: No mammographic evidence for malignancy.  Diagnostic mammogram 11/22/2015 IMPRESSION: No evidence of recurrent or new breast malignancy. Benign postsurgical changes on the right.  RECOMMENDATION: Diagnostic mammography in 1 year per standard post lumpectomy protocol. ASSESSMENT:  66 year old postmenopausal woman with extensive past medical history of heart failure, status post ICD placement, sleep apnea and hypothyroidism, who was found to have right breast DCIS by screening mammogram.  PLAN:  #1 right breast high grade DCIS, ER/PR weakly positive (4-6%) -I previously discussed her surgical pathology results with patient in details. Her DCIS has been completely resected. -The patient has early stage disease. She is cured by complete surgical resection alone  -Any form of adjuvant treatment is for prevention of disease recurrence.  -Giving the very weak ER/PR expression of her tumor, and her extensive cardiac disease, I do not recommend adjuvant  endocrine therapy.  -She received adjuvant breast radiation -She is clinically doing well, last mammogram was normal. Her exam today was unremarkable. -I encouraged her to continue breast cancer surveillance with yearly mammogram and physical exam.    FOLLOW UP: Follow up as needed. She will see PCP for breast exam.   All questions were answered. The patient knows to call the clinic with any problems, questions or concerns. I spent 15 minutes counseling the patient face to face. The total time spent in the appointment was 20 minutes and more than 50% was on counseling.  This document serves as a record of services personally performed by Truitt Merle, MD. It was created on her behalf by Brandt Loosen, a trained medical scribe. The creation of this record is based on the scribe's personal observations and the provider's statements to them. This document has been checked and approved by the attending provider.     Truitt Merle, MD

## 2017-03-04 ENCOUNTER — Encounter: Payer: Self-pay | Admitting: Hematology

## 2017-03-04 ENCOUNTER — Ambulatory Visit (HOSPITAL_BASED_OUTPATIENT_CLINIC_OR_DEPARTMENT_OTHER): Payer: Medicare Other | Admitting: Hematology

## 2017-03-04 ENCOUNTER — Telehealth: Payer: Self-pay | Admitting: Hematology

## 2017-03-04 VITALS — BP 132/65 | HR 63 | Temp 98.1°F | Resp 18 | Ht 66.5 in | Wt 215.1 lb

## 2017-03-04 DIAGNOSIS — Z86000 Personal history of in-situ neoplasm of breast: Secondary | ICD-10-CM

## 2017-03-04 DIAGNOSIS — C50411 Malignant neoplasm of upper-outer quadrant of right female breast: Secondary | ICD-10-CM

## 2017-03-04 DIAGNOSIS — Z170 Estrogen receptor positive status [ER+]: Principal | ICD-10-CM

## 2017-03-04 DIAGNOSIS — Z8679 Personal history of other diseases of the circulatory system: Secondary | ICD-10-CM

## 2017-03-04 DIAGNOSIS — Z923 Personal history of irradiation: Secondary | ICD-10-CM

## 2017-03-04 NOTE — Telephone Encounter (Signed)
Additional appts needed - per 7/16 los F/u as needed

## 2017-03-06 ENCOUNTER — Ambulatory Visit (INDEPENDENT_AMBULATORY_CARE_PROVIDER_SITE_OTHER): Payer: Medicare Other | Admitting: *Deleted

## 2017-03-06 DIAGNOSIS — I428 Other cardiomyopathies: Secondary | ICD-10-CM | POA: Diagnosis not present

## 2017-03-06 LAB — CUP PACEART REMOTE DEVICE CHECK
Battery Remaining Longevity: 78 mo
Battery Remaining Percentage: 80 %
Date Time Interrogation Session: 20180718070100
HighPow Impedance: 65 Ohm
Implantable Lead Implant Date: 20050603
Implantable Lead Location: 753860
Implantable Lead Model: 185
Implantable Lead Serial Number: 116340
Implantable Pulse Generator Implant Date: 20110505
Lead Channel Pacing Threshold Pulse Width: 0.4 ms
Lead Channel Setting Pacing Amplitude: 2.4 V
Lead Channel Setting Sensing Sensitivity: 0.4 mV
MDC IDC MSMT LEADCHNL RV IMPEDANCE VALUE: 657 Ohm
MDC IDC MSMT LEADCHNL RV PACING THRESHOLD AMPLITUDE: 0.8 V
MDC IDC PG SERIAL: 266301
MDC IDC SET LEADCHNL RV PACING PULSEWIDTH: 0.4 ms
MDC IDC STAT BRADY RV PERCENT PACED: 0 %

## 2017-03-06 NOTE — Progress Notes (Signed)
Remote ICD transmission.   

## 2017-03-07 ENCOUNTER — Encounter: Payer: Self-pay | Admitting: Cardiology

## 2017-03-13 ENCOUNTER — Encounter: Payer: Self-pay | Admitting: *Deleted

## 2017-03-20 ENCOUNTER — Ambulatory Visit (INDEPENDENT_AMBULATORY_CARE_PROVIDER_SITE_OTHER): Payer: Medicare Other | Admitting: Internal Medicine

## 2017-03-20 ENCOUNTER — Encounter: Payer: Self-pay | Admitting: Internal Medicine

## 2017-03-20 VITALS — BP 114/74 | HR 72 | Ht 66.5 in | Wt 217.1 lb

## 2017-03-20 DIAGNOSIS — R109 Unspecified abdominal pain: Secondary | ICD-10-CM

## 2017-03-20 DIAGNOSIS — K3189 Other diseases of stomach and duodenum: Secondary | ICD-10-CM

## 2017-03-20 DIAGNOSIS — K31A0 Gastric intestinal metaplasia, unspecified: Secondary | ICD-10-CM

## 2017-03-20 DIAGNOSIS — K219 Gastro-esophageal reflux disease without esophagitis: Secondary | ICD-10-CM | POA: Diagnosis not present

## 2017-03-20 DIAGNOSIS — K2940 Chronic atrophic gastritis without bleeding: Secondary | ICD-10-CM

## 2017-03-20 DIAGNOSIS — K5900 Constipation, unspecified: Secondary | ICD-10-CM

## 2017-03-20 MED ORDER — PANTOPRAZOLE SODIUM 40 MG PO TBEC: 40.0000 mg | DELAYED_RELEASE_TABLET | Freq: Every day | ORAL | 0 refills | Status: DC

## 2017-03-20 NOTE — Patient Instructions (Addendum)
Please decrease your protonix to once daily dosing (new script was sent to pharmacy).  Please purchase the following medications over the counter and take as directed: Miralax 1 capful (17 grams) daily as needed.  You have been scheduled for a CT scan of the abdomen and pelvis at [facility] ([address]---this is in the same building as Press photographer).   You are scheduled on Monday, 03/25/17 at 10:00 am. You should arrive 15 minutes prior to your appointment time for registration. Please follow the written instructions below on the day of your exam:  WARNING: IF YOU ARE ALLERGIC TO IODINE/X-RAY DYE, PLEASE NOTIFY RADIOLOGY IMMEDIATELY AT [phone]! YOU WILL BE GIVEN A 13 HOUR PREMEDICATION PREP.  1) Do not eat or drink anything after 6:00 am (4 hours prior to your test) 2) You have been given 2 bottles of oral contrast to drink. The solution may taste better if refrigerated, but do NOT add ice or any other liquid to this solution. Shake well before drinking.    Drink 1 bottle of contrast @ 8:00 am (2 hours prior to your exam)  Drink 1 bottle of contrast @ 9:00 am (1 hour prior to your exam)  You may take any medications as prescribed with a small amount of water except for the following: Metformin, Glucophage, Glucovance, Avandamet, Riomet, Fortamet, Actoplus Met, Janumet, Glumetza or Metaglip. The above medications must be held the day of the exam AND 48 hours after the exam.  The purpose of you drinking the oral contrast is to aid in the visualization of your intestinal tract. The contrast solution may cause some diarrhea. Before your exam is started, you will be given a small amount of fluid to drink. Depending on your individual set of symptoms, you may also receive an intravenous injection of x-ray contrast/dye. Plan on being at [facility] for 30 minutes or longer, depending on the type of exam you are having performed.  This test typically takes 30-45  minutes to complete.  If you have any questions regarding your exam or if you need to reschedule, you may call the CT department at [phone] between the hours of 8:00 am and 5:00 pm, Monday-Friday.  ________________________________________________________________________  If you are age 68 or older, your body mass index should be between 23-30. Your Body mass index is 34.52 kg/m. If this is out of the aforementioned range listed, please consider follow up with your Primary Care Provider.  If you are age 46 or younger, your body mass index should be between 19-25. Your Body mass index is 34.52 kg/m. If this is out of the aformentioned range listed, please consider follow up with your Primary Care Provider.

## 2017-03-20 NOTE — Progress Notes (Signed)
Subjective:    Patient ID: [name], female    DOB: 04-26-1951, 66 y.o.   MRN: [id number]  HPI [name] is a 66 year old female with a history of GERD, atrophic gastritis, small hiatal hernia, adenomatous colon polyps, colonic diverticulosis, history of C. difficile, history of breast cancer, history of V. tach status post ICD who is seen for follow-up. She was last seen at the time of surveillance endoscopy on 01/04/2016 and in the clinic on 02/01/2015. She is here alone today.  She reports that she has been feeling fairly well though for 6 months has been troubled by right sided abdominal pain. She reports this is a dull aching pain which is present nearly every day. Does not seem to worsen with eating. It doesn't seem to relate to bowel movement. It does not radiate. Lying down can help the pain improve. It is not been associated with nausea or vomiting. She reports a good appetite with no trouble swallowing. She is using pantoprazole 40 mg twice a day and denies heartburn. Bowel movements have been mostly regular for her though she will occasionally use MiraLAX. MiraLAX can make her stools too loose if she uses it too frequently. She denies blood in her stool or melena.  Her endoscopy performed last May revealed a tortuous distal esophagus with a nonobstructing Schatzki's ring. There was a 3 cm hiatal hernia present. There is localized minimal inflammation in the antrum and the gastric cardia, fundus and body appeared normal. Biopsies in the antrum showed chronic atrophic gastritis without H. pylori or dysplastic change. Proximal stomach biopsies were unremarkable. There is no H. pylori.  Review of Systems As per history of present illness, otherwise negative  Current Medications, Allergies, Past Medical History, Past Surgical History, Family History and Social History were reviewed in [employer] record.     Objective:   Physical Exam BP 114/74   Pulse 72    Ht 5' 6.5" (1.689 m)   Wt 217 lb 2 oz (98.5 kg)   LMP  (LMP Unknown)   SpO2 (!) 86%   BMI 34.52 kg/m  Constitutional: Well-developed and well-nourished. No distress. HEENT: Normocephalic and atraumatic. Conjunctivae are normal.  No scleral icterus. Neck: Neck supple. Trachea midline. Cardiovascular: Normal rate, regular rhythm and intact distal pulses. No M/R/G Pulmonary/chest: Effort normal and breath sounds normal. No wheezing, rales or rhonchi. Abdominal: Soft, Mildly tender in the right upper quadrant and right middle quadrant without rebound or guarding, nondistended. Bowel sounds active throughout.  No hepatosplenomegaly. Extremities: no clubbing, cyanosis, or edema, well-healed incision/scar from left TKA Neurological: Alert and oriented to person place and time. Skin: Skin is warm and dry. Psychiatric: Normal mood and affect. Behavior is normal.  CBC    Component Value Date/Time   WBC 4.5 02/27/2017 1518   RBC 4.48 02/27/2017 1518   HGB 13.2 02/27/2017 1518   HGB 12.4 12/01/2014 0845   HCT 40.4 02/27/2017 1518   HCT 38.8 12/01/2014 0845   PLT 224 02/27/2017 1518   PLT 213 12/01/2014 0845   MCV 90.2 02/27/2017 1518   MCV 88.5 12/01/2014 0845   MCH 29.5 02/27/2017 1518   MCHC 32.7 02/27/2017 1518   RDW 13.3 02/27/2017 1518   RDW 12.7 12/01/2014 0845   LYMPHSABS 1,395 02/27/2017 1518   LYMPHSABS 1.0 12/01/2014 0845   MONOABS 315 02/27/2017 1518   MONOABS 0.3 12/01/2014 0845   EOSABS 135 02/27/2017 1518   EOSABS 0.3 12/01/2014 0845   EOSABS 0.1  03/13/2007 1249   BASOSABS 45 02/27/2017 1518   BASOSABS 0.0 12/01/2014 0845   CMP     Component Value Date/Time   NA 139 02/27/2017 1518   NA 141 12/01/2014 0847   K 4.1 02/27/2017 1518   K 3.8 12/01/2014 0847   CL 103 02/27/2017 1518   CO2 27 02/27/2017 1518   CO2 26 12/01/2014 0847   GLUCOSE 91 02/27/2017 1518   GLUCOSE 175 (H) 12/01/2014 0847   BUN 14 02/27/2017 1518   BUN 10.6 12/01/2014 0847   CREATININE 0.82  02/27/2017 1518   CREATININE 0.8 12/01/2014 0847   CALCIUM 10.3 02/27/2017 1518   CALCIUM 9.8 12/01/2014 0847   PROT 7.6 02/27/2017 1518   PROT 7.5 12/01/2014 0847   ALBUMIN 4.4 02/27/2017 1518   ALBUMIN 3.9 12/01/2014 0847   AST 36 (H) 02/27/2017 1518   AST 29 12/01/2014 0847   ALT 39 (H) 02/27/2017 1518   ALT 33 12/01/2014 0847   ALKPHOS 120 02/27/2017 1518   ALKPHOS 127 12/01/2014 0847   BILITOT 0.5 02/27/2017 1518   BILITOT 0.42 12/01/2014 0847   GFRNONAA 74 02/27/2017 1518   GFRAA 85 02/27/2017 1518      Assessment & Plan:   66 year old female with a history of GERD, atrophic gastritis, small hiatal hernia, adenomatous colon polyps, colonic diverticulosis, history of C. difficile, history of breast cancer, history of V. tach status post ICD who is seen for follow-up.  1. GERD with history of atrophic gastritis with metaplasia -- unclear why she is using pantoprazole twice a day. Will reduce to 40 mg once daily as this medication and dose was effective for her previously to control symptoms. We discussed chronic PPI therapy today. Given atrophic gastritis with metaplasia we'll plan surveillance endoscopy in May 2020.  2. History of tubular adenoma -- due for surveillance colonoscopy next year.  3. Mild constipation -- continue MiraLAX 17 g when necessary  4. Right-sided abdominal pain -- unclear etiology but she is concerned. Will perform CT scan of the abdomen and pelvis with IV contrast for further evaluation. AST and ALT were very slightly elevated on labs performed earlier in July 2018. Primary care has plans to repeat liver enzymes and will evaluate liver will be seen on CT scan.  Annual follow-up, sooner if needed 25 minutes spent with the patient today. Greater than 50% was spent in counseling and coordination of care with the patient

## 2017-03-21 ENCOUNTER — Encounter: Payer: Self-pay | Admitting: Cardiology

## 2017-03-25 ENCOUNTER — Inpatient Hospital Stay: Admission: RE | Admit: 2017-03-25 | Payer: Medicare Other | Source: Ambulatory Visit

## 2017-04-12 ENCOUNTER — Encounter: Payer: Self-pay | Admitting: Family Medicine

## 2017-04-12 ENCOUNTER — Ambulatory Visit (INDEPENDENT_AMBULATORY_CARE_PROVIDER_SITE_OTHER): Payer: Medicare Other | Admitting: Family Medicine

## 2017-04-12 VITALS — BP 108/64 | HR 77 | Temp 98.0°F | Resp 16 | Ht 65.5 in | Wt 215.8 lb

## 2017-04-12 DIAGNOSIS — R739 Hyperglycemia, unspecified: Secondary | ICD-10-CM

## 2017-04-12 DIAGNOSIS — R202 Paresthesia of skin: Secondary | ICD-10-CM | POA: Diagnosis not present

## 2017-04-12 LAB — VITAMIN B12: Vitamin B-12: 403 pg/mL (ref 211–911)

## 2017-04-12 LAB — HEMOGLOBIN A1C: HEMOGLOBIN A1C: 5.6 % (ref 4.6–6.5)

## 2017-04-12 NOTE — Assessment & Plan Note (Signed)
Pt has some neuropathy symptoms in feet  Check A1C today (it has been well controlled)

## 2017-04-12 NOTE — Patient Instructions (Signed)
Labs today for neuropathy (B12 and A1C)   We will refer you to neurology for further evaluation

## 2017-04-12 NOTE — Assessment & Plan Note (Signed)
?   If poss neuropathy  Worst in big goes L>R  Limited sens pin prick and temp today  Check B12 and A1C Ref to neuro for further eval

## 2017-04-12 NOTE — Progress Notes (Signed)
Subjective:    Patient ID: [name], female    DOB: 1951-08-18, 66 y.o.   MRN: [id number]  HPI Here for neuro issues (? Neuropathy)  Both big toes are numb and tingly (at times overly sensitive) This can radiate to other toes  Getting some leg cramps/toe cramps   Flip flops aggravate it   No symptoms in fingers   Had knee surgery and then got sciatica- that is better now   Wt Readings from Last 3 Encounters:  04/12/17 215 lb 12.8 oz (97.9 kg)  03/20/17 217 lb 2 oz (98.5 kg)  03/04/17 215 lb 1.6 oz (97.6 kg)   35.36 kg/m  Lab Results  Component Value Date   HGBA1C 5.5 11/26/2016    Lab Results  Component Value Date   TSH 3.70 02/22/2017    Patient Active Problem List   Diagnosis Date Noted  . Paresthesia of foot, bilateral 04/12/2017  . Itching 02/22/2017  . Primary osteoarthritis of both hands 09/27/2016  . Primary osteoarthritis of both knees 09/27/2016  . Urinary frequency 09/26/2016  . Screen for STD (sexually transmitted disease) 09/26/2016  . Nasopharyngitis, 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/2016  . Postnasal drip 05/18/2016  . S/P total knee replacement 02/13/2016  . Chronic pain of both knees 12/06/2015  . Globus pharyngeus 05/17/2015  . Productive cough 05/17/2015  . Genetic testing 12/23/2014  . Family history of breast cancer   . Family history of colon cancer   . Family history of pancreatic cancer   . Breast cancer of upper-outer quadrant of right female breast ([place]) 11/23/2014  . Allergy to ACE inhibitors 09/27/2014  . Encounter for Medicare annual wellness exam 06/27/2014  . Hyperglycemia 06/23/2013  . Routine general medical examination at a health care facility 06/23/2013  . Dizziness and giddiness 01/16/2013  . Disturbance of skin sensation 01/16/2013  . RLS (restless legs syndrome) 01/15/2013  . Hx of Clostridium difficile infection 10/10/2012  . Bronchiectasis without acute exacerbation ([place]) 04/29/2012  . Dyspnea on exertion 04/02/2012  .  Non-ischemic cardiomyopathy ([place]) 03/07/2012  . Colon cancer screening 01/11/2012  . Post-menopausal 01/11/2012  . Chest pain 11/07/2011  . Palpitations 04/17/2011  . Abnormal tympanic membrane 01/22/2011  . Obstructive sleep apnea 04/04/2010  . VENTRICULAR TACHYCARDIA 01/03/2009  . ICD  [employer]  Single chamber 01/03/2009  . PATENT FORAMEN OVALE 09/29/2008  . Vitamin D deficiency 08/17/2008  . Hypothyroidism 11/18/2006  . Hyperlipidemia 11/18/2006  . DEPRESSION 11/18/2006  . Essential hypertension 11/18/2006  . MITRAL VALVE PROLAPSE 11/18/2006  . CEREBRAL ANEURYSM 11/18/2006  . Allergic rhinitis 11/18/2006  . GERD 11/18/2006  . DIVERTICULOSIS, COLON 11/18/2006  . Fatty liver 11/18/2006  . Fibromyalgia 11/18/2006   Past Medical History:  Diagnosis Date  . AICD (automatic cardioverter/defibrillator) present   . Anxiety   . Arthritis   . 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 Cerebral aneurysm, nonruptured   . Clostridium difficile infection   . Depressive disorder, not elsewhere classified   . Diverticulosis of colon (without mention of hemorrhage)   . Esophageal reflux   . Fibromyalgia   . Gastritis   . Gastritis   . GERD (gastroesophageal reflux disease)   . GI bleed   . Hiatal hernia   . Hyperlipidemia   . Hypertension   . Hypothyroidism   . Internal hemorrhoids   . Obstructive sleep apnea (adult) (pediatric)   . Ostium secundum type atrial septal defect   . Other chronic nonalcoholic liver disease   .  Other pulmonary embolism and infarction   . Paroxysmal ventricular tachycardia ([place])   . Personal history of radiation therapy   . Pneumonia    history  . Presence of permanent cardiac pacemaker   . PUD (peptic ulcer disease)   . Radiation 02/03/15-03/10/15   Right Breast  . Sarcoid   . Schatzki's ring   . Stroke [facility]) 2013   tia  . Takotsubo syndrome   . Tubular adenoma of colon   . Unspecified transient cerebral ischemia   . Unspecified  vitamin D deficiency    Past Surgical History:  Procedure Laterality Date  . ABDOMINAL HYSTERECTOMY    . ABI  2006   normal  . BREAST EXCISIONAL BIOPSY    . BREAST LUMPECTOMY     right breast 2016  . BREAST LUMPECTOMY WITH RADIOACTIVE SEED LOCALIZATION Right 01/03/2015   Procedure: BREAST LUMPECTOMY WITH RADIOACTIVE SEED LOCALIZATION;  Surgeon: Autumn Messing III, MD;  Location: [facility];  Service: General;  Laterality: Right;  . CARDIAC DEFIBRILLATOR PLACEMENT  2006; 2012   BSX single chamber ICD  . carotid dopplers  2006   neg  . CEREBRAL ANEURYSM REPAIR  02/1999  . hospitalization  2004   GI bleed, PUD, diverticulosis (EGD,colonscopy)  . hospitalization     PE, NSVT, s/p defib  . KNEE ARTHROSCOPY     bilateral  . LEFT HEART CATHETERIZATION WITH CORONARY ANGIOGRAM N/A 02/22/2012   Procedure: LEFT HEART CATHETERIZATION WITH CORONARY ANGIOGRAM;  Surgeon: Burnell Blanks, MD;  Location: [facility] CATH LAB;  Service: Cardiovascular;  Laterality: N/A;  . PARTIAL HYSTERECTOMY     Fibroids  . TOTAL KNEE ARTHROPLASTY Left 02/13/2016   Procedure: TOTAL KNEE ARTHROPLASTY;  Surgeon: Vickey Huger, MD;  Location: [place];  Service: Orthopedics;  Laterality: Left;   Social History  Substance Use Topics  . Smoking status: Former Smoker    Packs/day: 1.00    Years: 15.00    Types: Cigarettes    Quit date: 08/20/1989  . Smokeless tobacco: Never Used     Comment: Started at 72; less than 1 PPD  . Alcohol use 0.0 oz/week     Comment: rare   Family History  Problem Relation Age of Onset  . Diabetes Mother   . Alzheimer's disease Mother   . Other Brother        Thyroid problem 10/2016  . Allergies Sister   . Heart disease Sister   . Heart disease Brother   . Prostate cancer Brother 69       same brother as throat cancer  . Throat cancer Brother        dx in his 60s; also a smoker  . Heart disease Father   . Cancer Maternal Grandmother 13       colon cancer or abdominal cancer  . Lung cancer  Maternal Grandfather 30  . Breast cancer Maternal Aunt 47  . Colon cancer Maternal Aunt 65       same sister as breast at 71  . Lung cancer Maternal Uncle   . Breast cancer Maternal Aunt        dx in her 75s  . Pancreatic cancer Cousin 55       maternal first cousin  . Breast cancer Cousin        paternal first cousin twice removed died in her 66s  . Cardiomyopathy Unknown        Family history  . Heart attack Sister   . Hypertension Sister   .  Hypertension Brother   . Heart disease Son        Cardiac Arrest 07/2016  . Stroke Neg Hx    Allergies  Allergen Reactions  . Ace Inhibitors Swelling    Angioedema; makes tongue "break out" Angioedema; makes tongue "break out"  . Atorvastatin Other (See Comments)    SEVERE MYALGIAS, MUSCLE PAIN SEVERE MYALGIAS, MUSCLE PAIN  . Ramipril Other (See Comments)    TONGUE ULCERS TONGUE ULCERS  . Rosuvastatin Other (See Comments)    MYALGIAS, SEVERE MUSCLE PAIN MYALGIAS, SEVERE MUSCLE PAIN  . Amitriptyline Hcl Other (See Comments)    Unknown  . Amitriptyline Hcl Other (See Comments)    Unknown  . Iodine-131 Other (See Comments)  . Paroxetine Other (See Comments)    UNKNOWN UNKNOWN  . Carbamazepine Rash    REACTION: ? reaction REACTION: ? reaction  . Cetirizine Hcl Other (See Comments)    REACTION: sleepy  . Cetirizine Hcl Other (See Comments)    REACTION: sleepy  . Codeine Itching  . Dilantin [Phenytoin Sodium Extended] Rash  . Phenytoin Sodium Extended Rash   Current Outpatient Prescriptions on File Prior to Visit  Medication Sig Dispense Refill  . acetaminophen (TYLENOL) 500 MG tablet Take 500 mg by mouth every 6 (six) hours as needed (pain).     [name] aspirin 81 MG tablet Take 81 mg by mouth daily.    . baclofen (LIORESAL) 10 MG tablet Take 1 tablet (10 mg total) by mouth 3 (three) times daily. 270 tablet 1  . carvedilol (COREG) 12.5 MG tablet Take 1 tablet (12.5 mg total) by mouth 2 (two) times daily. 180 tablet 3  .  cholecalciferol (VITAMIN D) 1000 UNITS tablet Take 2,000 Units by mouth daily.    . diclofenac sodium (VOLTAREN) 1 % GEL Voltaren Gel 3 grams to 3 large joints upto TID 3 TUBES with 3 refills 3 Tube 3  . fexofenadine (ALLEGRA) 180 MG tablet Take 180 mg by mouth daily as needed for allergies or rhinitis.    . fluticasone (FLONASE) 50 MCG/ACT nasal spray Place 2 sprays into both nostrils daily as needed for allergies or rhinitis. 48 g 3  . hydrOXYzine (ATARAX/VISTARIL) 10 MG tablet Take 1 tablet (10 mg total) by mouth 3 (three) times daily as needed for itching. With caution of sedation 30 tablet 0  . latanoprost (XALATAN) 0.005 % ophthalmic solution Place 1 drop into both eyes at bedtime.    [name] levothyroxine (SYNTHROID, LEVOTHROID) 50 MCG tablet Take 1 tablet (50 mcg total) by mouth daily. 90 tablet 3  . mometasone (ELOCON) 0.1 % lotion     . montelukast (SINGULAIR) 10 MG tablet Take 1 tablet (10 mg total) by mouth at bedtime. 90 tablet 3  . Multiple Vitamins-Minerals (MULTIVITAMIN PO) Take 1 tablet by mouth daily.    . pantoprazole (PROTONIX) 40 MG tablet Take 1 tablet (40 mg total) by mouth daily. 90 tablet 0  . polyethylene glycol powder (GLYCOLAX/MIRALAX) powder Take 17 grams by mouth daily 1080 g 0  . potassium chloride (K-DUR) 10 MEQ tablet TAKE 1/2 TABLET (5MEQ TOTAL) EVERY DAY 45 tablet 1  . simvastatin (ZOCOR) 40 MG tablet Take 0.5 tablets (20 mg total) by mouth daily. 45 tablet 3  . zolpidem (AMBIEN) 10 MG tablet TAKE ONE TABLET BY MOUTH AT BEDTIME AS NEEDED FOR SLEEP 90 tablet 1  . [DISCONTINUED] metoprolol succinate (TOPROL-XL) 50 MG 24 hr tablet Take 2 tablets (100 mg total) by mouth daily. Take with or immediately  following a meal. 90 tablet 3   No current facility-administered medications on file prior to visit.      Review of Systems Review of Systems  Constitutional: Negative for fever, appetite change,  and unexpected weight change.  Eyes: Negative for pain and visual  disturbance.  Respiratory: Negative for cough and shortness of breath.   Cardiovascular: Negative for cp or palpitations    Gastrointestinal: Negative for nausea, diarrhea and constipation.  Genitourinary: Negative for urgency and frequency.  Skin: Negative for pallor or rash   Neurological: Negative for weakness, light-headedness,,  and headaches. pos for numbness/tingling in feet (not hands)  Hematological: Negative for adenopathy. Does not bruise/bleed easily.  Psychiatric/Behavioral: Negative for dysphoric mood. The patient is not nervous/anxious.         Objective:   Physical Exam  Constitutional: She appears well-developed and well-nourished. No distress.  obese and well appearing   HENT:  Head: Normocephalic and atraumatic.  Mouth/Throat: Oropharynx is clear and moist.  Eyes: Pupils are equal, round, and reactive to light. Conjunctivae and EOM are normal.  Neck: Normal range of motion. Neck supple. No JVD present. Carotid bruit is not present. No thyromegaly present.  Cardiovascular: Normal rate, regular rhythm and intact distal pulses.  Exam reveals no gallop.   Nl pedal pulses   Pulmonary/Chest: Effort normal and breath sounds normal. No respiratory distress. She has no wheezes. She has no rales.  No crackles  Abdominal: Soft. Bowel sounds are normal. She exhibits no distension, no abdominal bruit and no mass. There is no tenderness.  Musculoskeletal: She exhibits no edema.  Lymphadenopathy:    She has no cervical adenopathy.  Neurological: She is alert. She has normal reflexes. She displays no atrophy and no tremor. A sensory deficit is present. No cranial nerve deficit. She exhibits normal muscle tone. Coordination normal.  Decreased sens to pin prick in both great toes L>R Dec sens to temp in all toes  Nl rom of toes and feet  Well perfused   Skin: Skin is warm and dry. No rash noted.  Psychiatric: She has a normal mood and affect.          Assessment & Plan:    Problem List Items Addressed This Visit      Other   Hyperglycemia - Primary    Pt has some neuropathy symptoms in feet  Check A1C today (it has been well controlled)      Relevant Orders   Hemoglobin A1c   Paresthesia of foot, bilateral    ? If poss neuropathy  Worst in big goes L>R  Limited sens pin prick and temp today  Check B12 and A1C Ref to neuro for further eval       Relevant Orders   Ambulatory referral to Neurology   Vitamin B12

## 2017-04-23 ENCOUNTER — Telehealth: Payer: Self-pay | Admitting: Family Medicine

## 2017-04-23 ENCOUNTER — Other Ambulatory Visit: Payer: Self-pay | Admitting: Cardiology

## 2017-04-23 NOTE — Telephone Encounter (Signed)
Left voicemail requesting pt to call the office back 

## 2017-04-23 NOTE — Telephone Encounter (Signed)
Best number   [phone] Pt called wanting someone to call her about her lab results She is aware they are on mychart but she wanted to talk to someone

## 2017-04-25 ENCOUNTER — Telehealth: Payer: Self-pay | Admitting: Internal Medicine

## 2017-04-25 ENCOUNTER — Ambulatory Visit (INDEPENDENT_AMBULATORY_CARE_PROVIDER_SITE_OTHER): Payer: Medicare Other | Admitting: Internal Medicine

## 2017-04-25 ENCOUNTER — Encounter: Payer: Self-pay | Admitting: Internal Medicine

## 2017-04-25 VITALS — BP 114/80 | HR 72 | Ht 65.5 in | Wt 218.2 lb

## 2017-04-25 DIAGNOSIS — R0602 Shortness of breath: Secondary | ICD-10-CM

## 2017-04-25 DIAGNOSIS — I472 Ventricular tachycardia, unspecified: Secondary | ICD-10-CM

## 2017-04-25 DIAGNOSIS — I5042 Chronic combined systolic (congestive) and diastolic (congestive) heart failure: Secondary | ICD-10-CM

## 2017-04-25 DIAGNOSIS — I428 Other cardiomyopathies: Secondary | ICD-10-CM | POA: Diagnosis not present

## 2017-04-25 DIAGNOSIS — Z95810 Presence of automatic (implantable) cardiac defibrillator: Secondary | ICD-10-CM

## 2017-04-25 MED ORDER — FUROSEMIDE 40 MG PO TABS: ORAL_TABLET | ORAL | 3 refills | Status: DC

## 2017-04-25 NOTE — Telephone Encounter (Signed)
I spoke with the patient- she states she has had ongoing SOB for a few months- mostly she notices this when she lays down.  She occasionally has a rapid heart beat associated with this.  She denies lower extremity or abdominal swelling/ fullness. She has seen pulmonary and they have told her her lungs are ok and to follow up with cardiology. The patient has also noticed int he last 2 weeks that she is having a tired feeling in her legs- her PCP has referred her to neurology for this, but she does not see them until October. I advised the patient with her multiple symptoms, we could bring her in for evaluation. She is aware there is a cancellation this afternoon at 4pm with Dr. Caryl Comes- she will come in at that time for follow up.

## 2017-04-25 NOTE — Telephone Encounter (Signed)
New Message     Pt c/o Shortness Of Breath: STAT if SOB developed within the last 24 hours or pt is noticeably SOB on the phone  1. Are you currently SOB (can you hear that pt is SOB on the phone)?  no  2. How long have you been experiencing SOB?  3 months   3. Are you SOB when sitting or when up moving around?  When laying down , has to stay propped up   4. Are you currently experiencing any other symptoms? Has aching in legs and light headedness that comes and goes, but no other symptoms (chest pain, swelling ect)

## 2017-04-25 NOTE — Telephone Encounter (Signed)
She is coming in this afternoon for evaluation of her symptoms of SOB- will address at that time.

## 2017-04-25 NOTE — Patient Instructions (Signed)
Medication Instructions: - Your physician has recommended you make the following change in your medication:  1) STOP hydrochlorothiazide 2) START lasix (furosemide) 40 mg- take 1 tablet (40 mg) by mouth once daily x 3 days, then 1 tablet every other day x 1 week  Labwork: - Your physician recommends that you have lab work: BNP- Friday 04/26/17 (7:30 am- 4:30 pm)  Procedures/Testing: - Your physician has requested that you have an echocardiogram. Echocardiography is a painless test that uses sound waves to create images of your heart. It provides your doctor with information about the size and shape of your heart and how well your heart's chambers and valves are working. This procedure takes approximately one hour. There are no restrictions for this procedure.  Follow-Up: - Remote monitoring is used to monitor your Pacemaker of ICD from home. This monitoring reduces the number of office visits required to check your device to one time per year. It allows [place] to keep an eye on the functioning of your device to ensure it is working properly. You are scheduled for a device check from home on 06/05/17. You may send your transmission at any time that day. If you have a wireless device, the transmission will be sent automatically. After your physician reviews your transmission, you will receive a postcard with your next transmission date.    Any Additional Special Instructions Will Be Listed Below (If Applicable).     If you need a refill on your cardiac medications before your next appointment, please call your pharmacy.

## 2017-04-25 NOTE — Telephone Encounter (Signed)
All Notes   Patient Instructions by Emily Filbert, RN at 04/25/2017 4:00 PM   Author: Emily Filbert, RN Author Type: Registered Nurse Filed: 04/25/2017 5:10 PM  Note Status: Signed Cosign: Cosign Not Required Encounter Date: 04/25/2017  Editor: Emily Filbert, RN (Registered Nurse)    Medication Instructions: - Your physician has recommended you make the following change in your medication:  1) STOP hydrochlorothiazide

## 2017-04-25 NOTE — Progress Notes (Signed)
Patient Care Team: Tower, [name], MD as PCP - Appling, Bufalo, MD as Consulting Physician (General Surgery) Truitt Merle, MD as Consulting Physician (Hematology) Thea Silversmith, MD (Inactive) as Consulting Physician (Radiation Oncology) Rockwell Germany, RN as Registered Nurse Mauro Kaufmann, RN as Registered Nurse Deboraha Sprang, MD as Consulting Physician (Cardiology) Holley Bouche, NP as Nurse Practitioner (Nurse Practitioner) Sylvan Cheese, NP as Nurse Practitioner (Nurse Practitioner) Chesley Mires, MD as Consulting Physician (Pulmonary Disease) Jovita Kussmaul, MD as Consulting Physician (General Surgery) Bo Merino, MD as Consulting Physician (Rheumatology)   HPI  [name] is a 66 y.o. 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  She was seen by [facility]-NP 2 weeks ago for complaints of tachypalps  Metop increased with some interval improvement    Date Cr K Hgb  7/18 0.82 4.1   13.2          She has had complaints of shortness of breath over the last 3-6 months with some nocturnal dyspnea and some orthopnea. There has been only minimal peripheral edema. She's also had exercise intolerance. She has a history of bronchiectasis. Her last CT scan was 2017 noted to be without change from before. She was seen by pulmonary (TP-NP) 5/18; PFTs were done that day" better than before "follow-up  was scheduled with Dr. Helyn Numbers in October.  Past Medical History:  Diagnosis Date  . AICD (automatic cardioverter/defibrillator) present   . Anxiety   . Arthritis   . 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 Cerebral aneurysm, nonruptured   . Clostridium difficile infection   . Depressive disorder, not elsewhere classified   . Diverticulosis of colon (without mention of hemorrhage)   . Esophageal reflux   . Fibromyalgia   . Gastritis   . Gastritis   . GERD (gastroesophageal reflux disease)   . GI bleed   . Hiatal hernia   . Hyperlipidemia   . Hypertension   . Hypothyroidism   . Internal hemorrhoids   . Obstructive sleep apnea (adult) (pediatric)   . Ostium secundum type atrial septal defect   . Other chronic nonalcoholic liver disease   . Other pulmonary embolism and infarction   . Paroxysmal ventricular tachycardia ([place])   . Personal history of radiation therapy   . Pneumonia    history  . Presence of permanent cardiac pacemaker   . PUD (peptic ulcer disease)   . Radiation 02/03/15-03/10/15   Right Breast  . Sarcoid   . Schatzki's ring   . Stroke [facility]) 2013   tia  . Takotsubo syndrome   . Tubular adenoma of colon   . Unspecified transient cerebral ischemia   . Unspecified vitamin D deficiency     Past Surgical History:  Procedure Laterality Date  . ABDOMINAL HYSTERECTOMY    . ABI  2006   normal  . BREAST EXCISIONAL BIOPSY    .  BREAST LUMPECTOMY     right breast 2016  . BREAST LUMPECTOMY WITH RADIOACTIVE SEED LOCALIZATION Right 01/03/2015   Procedure: BREAST LUMPECTOMY WITH RADIOACTIVE SEED LOCALIZATION;  Surgeon: Autumn Messing III, MD;  Location: [place];  Service: General;  Laterality: Right;  . CARDIAC DEFIBRILLATOR PLACEMENT  2006; 2012   BSX single chamber ICD  . carotid dopplers  2006   neg  . CEREBRAL ANEURYSM REPAIR  02/1999  . hospitalization  2004   GI bleed, PUD, diverticulosis (EGD,colonscopy)  . hospitalization     PE, NSVT, s/p defib  . KNEE  ARTHROSCOPY     bilateral  . LEFT HEART CATHETERIZATION WITH CORONARY ANGIOGRAM N/A 02/22/2012   Procedure: LEFT HEART CATHETERIZATION WITH CORONARY ANGIOGRAM;  Surgeon: Burnell Blanks, MD;  Location: [facility] CATH LAB;  Service: Cardiovascular;  Laterality: N/A;  . PARTIAL HYSTERECTOMY     Fibroids  . TOTAL KNEE ARTHROPLASTY Left 02/13/2016   Procedure: TOTAL KNEE ARTHROPLASTY;  Surgeon: Vickey Huger, MD;  Location: [place];  Service: Orthopedics;  Laterality: Left;    Current Outpatient Prescriptions  Medication Sig Dispense Refill  . acetaminophen (TYLENOL) 500 MG tablet Take 500 mg by mouth every 6 (six) hours as needed (pain).     [name] aspirin 81 MG tablet Take 81 mg by mouth daily.    . baclofen (LIORESAL) 10 MG tablet Take 1 tablet (10 mg total) by mouth 3 (three) times daily. 270 tablet 1  . carvedilol (COREG) 12.5 MG tablet Take 1 tablet (12.5 mg total) by mouth 2 (two) times daily. 180 tablet 3  . cholecalciferol (VITAMIN D) 1000 UNITS tablet Take 2,000 Units by mouth daily.    . diclofenac sodium (VOLTAREN) 1 % GEL Voltaren Gel 3 grams to 3 large joints upto TID 3 TUBES with 3 refills 3 Tube 3  . fexofenadine (ALLEGRA) 180 MG tablet Take 180 mg by mouth daily as needed for allergies or rhinitis.    . fluticasone (FLONASE) 50 MCG/ACT nasal spray Place 2 sprays into both nostrils daily as needed for allergies or rhinitis. 48 g 3  . hydrOXYzine (ATARAX/VISTARIL) 10 MG tablet Take 1 tablet (10 mg total) by mouth 3 (three) times daily as needed for itching. With caution of sedation 30 tablet 0  . latanoprost (XALATAN) 0.005 % ophthalmic solution Place 1 drop into both eyes at bedtime.    [name] levothyroxine (SYNTHROID, LEVOTHROID) 50 MCG tablet Take 1 tablet (50 mcg total) by mouth daily. 90 tablet 3  . mometasone (ELOCON) 0.1 % lotion     . montelukast (SINGULAIR) 10 MG tablet Take 1 tablet (10 mg total) by mouth at bedtime. 90 tablet 3  . Multiple Vitamins-Minerals (MULTIVITAMIN PO) Take 1  tablet by mouth daily.    . pantoprazole (PROTONIX) 40 MG tablet Take 1 tablet (40 mg total) by mouth daily. 90 tablet 0  . polyethylene glycol powder (GLYCOLAX/MIRALAX) powder Take 17 grams by mouth daily 1080 g 0  . potassium chloride (K-DUR) 10 MEQ tablet TAKE 1/2 TABLET (5MEQ TOTAL) EVERY DAY 45 tablet 1  . simvastatin (ZOCOR) 40 MG tablet Take 0.5 tablets (20 mg total) by mouth daily. 45 tablet 3  . zolpidem (AMBIEN) 10 MG tablet TAKE ONE TABLET BY MOUTH AT BEDTIME AS NEEDED FOR SLEEP 90 tablet 1   No current facility-administered medications for this visit.     Allergies  Allergen Reactions  . Ace Inhibitors Swelling    Angioedema; makes tongue "break out" Angioedema; makes tongue "break out"  .  Atorvastatin Other (See Comments)    SEVERE MYALGIAS, MUSCLE PAIN SEVERE MYALGIAS, MUSCLE PAIN  . Ramipril Other (See Comments)    TONGUE ULCERS TONGUE ULCERS  . Rosuvastatin Other (See Comments)    MYALGIAS, SEVERE MUSCLE PAIN MYALGIAS, SEVERE MUSCLE PAIN  . Amitriptyline Hcl Other (See Comments)    Unknown  . Amitriptyline Hcl Other (See Comments)    Unknown  . Iodine-131 Other (See Comments)  . Paroxetine Other (See Comments)    UNKNOWN UNKNOWN  . Carbamazepine Rash    REACTION: ? reaction REACTION: ? reaction  . Cetirizine Hcl Other (See Comments)    REACTION: sleepy  . Cetirizine Hcl Other (See Comments)    REACTION: sleepy  . Codeine Itching  . Dilantin [Phenytoin Sodium Extended] Rash  . 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  The information was reviewed. No changes were made in the programming.   She continues to have palpitations with some nonsustained VT and nonsustained SVT.  The issue of her dyspnea is challenging. Differential diagnosis is broad and the pretest likelihood that this is pulmonary in her scarring and bronchiectasis is high. She has just a little bit of right-sided volume overload with JVD and edema but she does have mild pulmonary hypertension.  We will get an echocardiogram to look at her E/E' as well as reassess left ventricular function. We will also check a BNP. I will also try an empiric trial of diuretics switching her HCTZ--furosemide 40 mg daily for 3 days and then every other day times a week to see if we can make a difference. She will let us know. I've encouraged her to follow-up with Dr. Halford Chessman as scheduled

## 2017-04-25 NOTE — Telephone Encounter (Signed)
Should the patient still be taking this medication? It is not on her current med list, looks like it was taken off by her pcp at her recent office visit with a reason of patient discharge. If she is supposed to be taking it, okay to refill under Dr Caryl Comes? Please advise. Thanks, MI

## 2017-04-26 ENCOUNTER — Other Ambulatory Visit: Payer: Medicare Other

## 2017-04-26 ENCOUNTER — Other Ambulatory Visit: Payer: Medicare Other | Admitting: *Deleted

## 2017-04-26 ENCOUNTER — Telehealth: Payer: Self-pay | Admitting: Pulmonary Disease

## 2017-04-26 DIAGNOSIS — G4733 Obstructive sleep apnea (adult) (pediatric): Secondary | ICD-10-CM

## 2017-04-26 DIAGNOSIS — R0602 Shortness of breath: Secondary | ICD-10-CM

## 2017-04-26 NOTE — Telephone Encounter (Signed)
Can send order to DME to arrange for mask refitting.  If that doesn't work, then arrange for mask desensitization with Lynnae Sandhoff in sleep lab.

## 2017-04-26 NOTE — Telephone Encounter (Signed)
Called and spoke with pt and she stated that her cpap mask is not fitting her now and she stated that she called [facility] and they told her that someone would call her back and they never did.  She called again and someone called her back and said that they would get back with her and that she needed to call us.  VS please advise. Thanks

## 2017-04-26 NOTE — Telephone Encounter (Signed)
Order has been sent in to the DME.  They will contact the pt to get this set up for her. I have called and spoke with pt and she is aware.

## 2017-04-27 LAB — PRO B NATRIURETIC PEPTIDE: NT-Pro BNP: 89 pg/mL (ref 0–301)

## 2017-05-03 ENCOUNTER — Other Ambulatory Visit (HOSPITAL_COMMUNITY): Payer: Medicare Other

## 2017-05-06 ENCOUNTER — Other Ambulatory Visit: Payer: Self-pay

## 2017-05-06 MED ORDER — CARVEDILOL 12.5 MG PO TABS: 12.5000 mg | ORAL_TABLET | Freq: Two times a day (BID) | ORAL | 3 refills | Status: DC

## 2017-05-06 MED ORDER — FUROSEMIDE 40 MG PO TABS: ORAL_TABLET | ORAL | 11 refills | Status: DC

## 2017-05-09 ENCOUNTER — Other Ambulatory Visit: Payer: Self-pay | Admitting: Internal Medicine

## 2017-05-09 LAB — CUP PACEART INCLINIC DEVICE CHECK
Brady Statistic RV Percent Paced: 1 % — CL
HIGH POWER IMPEDANCE MEASURED VALUE: 38 Ohm
HIGH POWER IMPEDANCE MEASURED VALUE: 66 Ohm
Implantable Lead Serial Number: 116340
Lead Channel Impedance Value: 657 Ohm
Lead Channel Sensing Intrinsic Amplitude: 11 mV
Lead Channel Setting Pacing Amplitude: 2.4 V
Lead Channel Setting Sensing Sensitivity: 0.4 mV
MDC IDC LEAD IMPLANT DT: 20050603
MDC IDC LEAD LOCATION: 753860
MDC IDC MSMT LEADCHNL RV PACING THRESHOLD AMPLITUDE: 0.8 V
MDC IDC MSMT LEADCHNL RV PACING THRESHOLD PULSEWIDTH: 0.4 ms
MDC IDC PG IMPLANT DT: 20110505
MDC IDC SESS DTM: 20180906040000
MDC IDC SET LEADCHNL RV PACING PULSEWIDTH: 0.4 ms
Pulse Gen Serial Number: 266301

## 2017-05-09 MED ORDER — CARVEDILOL 12.5 MG PO TABS: 12.5000 mg | ORAL_TABLET | Freq: Two times a day (BID) | ORAL | 3 refills | Status: DC

## 2017-05-09 NOTE — Telephone Encounter (Signed)
[facility] pharmacy requesting a refill on Furosemide 40 mg tablet. Per LOV on 04/25/17, per Dr. Caryl Comes, ) STOP hydrochlorothiazide  START lasix (furosemide) 40 mg- take 1 tablet (40 mg) by mouth once daily x 3 days, then 1 tablet every other day x 1 week, does not state if pt is suppose to stop medication or continue. Would you like to refill this medication? Please advise

## 2017-05-10 MED ORDER — FUROSEMIDE 40 MG PO TABS: ORAL_TABLET | ORAL | 3 refills | Status: DC

## 2017-05-10 NOTE — Telephone Encounter (Signed)
Pt's medication was sent to pt's pharmacy as requested. Confirmation received.  °

## 2017-05-10 NOTE — Telephone Encounter (Signed)
Ok to refill lasix 40 mg - take 1 tablet by mouth once daily as needed for increased swelling/ shortness of breath

## 2017-05-13 ENCOUNTER — Other Ambulatory Visit: Payer: Self-pay

## 2017-05-13 ENCOUNTER — Ambulatory Visit (HOSPITAL_COMMUNITY): Payer: Medicare Other | Attending: Cardiology

## 2017-05-13 DIAGNOSIS — I071 Rheumatic tricuspid insufficiency: Secondary | ICD-10-CM | POA: Diagnosis not present

## 2017-05-13 DIAGNOSIS — I420 Dilated cardiomyopathy: Secondary | ICD-10-CM | POA: Diagnosis not present

## 2017-05-13 DIAGNOSIS — I5030 Unspecified diastolic (congestive) heart failure: Secondary | ICD-10-CM | POA: Insufficient documentation

## 2017-05-13 DIAGNOSIS — R0602 Shortness of breath: Secondary | ICD-10-CM | POA: Diagnosis present

## 2017-05-16 ENCOUNTER — Telehealth: Payer: Self-pay

## 2017-05-16 NOTE — Telephone Encounter (Signed)
Pt is aware and agreeable to normal results. She is agreeable to follow up with Dr. Halford Chessman about lung issues.

## 2017-05-17 ENCOUNTER — Ambulatory Visit (INDEPENDENT_AMBULATORY_CARE_PROVIDER_SITE_OTHER): Payer: Medicare Other | Admitting: Family Medicine

## 2017-05-17 ENCOUNTER — Encounter: Payer: Self-pay | Admitting: Family Medicine

## 2017-05-17 VITALS — BP 112/66 | HR 66 | Temp 98.1°F | Ht 65.5 in | Wt 221.8 lb

## 2017-05-17 DIAGNOSIS — J0100 Acute maxillary sinusitis, unspecified: Secondary | ICD-10-CM | POA: Diagnosis not present

## 2017-05-17 DIAGNOSIS — J019 Acute sinusitis, unspecified: Secondary | ICD-10-CM | POA: Insufficient documentation

## 2017-05-17 MED ORDER — AMOXICILLIN-POT CLAVULANATE 875-125 MG PO TABS: 1.0000 | ORAL_TABLET | Freq: Two times a day (BID) | ORAL | 0 refills | Status: DC

## 2017-05-17 NOTE — Progress Notes (Signed)
Subjective:    Patient ID: [name], female    DOB: 07/16/1951, 66 y.o.   MRN: [id number]  HPI  Here for congestion 2 weeks   pnd getting worse and worse  Hard to clear throat /cough it up - yellow and thick   Uses nasal saline regularly  Nose is very congested  Some facial pain in R cheek on and off   Pt has a hx of bronchiectasis for which she sees pulmonary  Also allergic rhinitis   Takes allegra  Not compliant with flonase  occ uses mucinex  No fever  Has been warm natured lately   Ears feel ok  Cough- not bad /not deep   Patient Active Problem List   Diagnosis Date Noted  . Acute sinusitis 05/17/2017  . Paresthesia of foot, bilateral 04/12/2017  . Itching 02/22/2017  . Primary osteoarthritis of both hands 09/27/2016  . Primary osteoarthritis of both knees 09/27/2016  . Urinary frequency 09/26/2016  . Screen for STD (sexually transmitted disease) 09/26/2016  . Nasopharyngitis, 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/2016  . Postnasal drip 05/18/2016  . S/P total knee replacement 02/13/2016  . Chronic pain of both knees 12/06/2015  . Globus pharyngeus 05/17/2015  . Productive cough 05/17/2015  . Genetic testing 12/23/2014  . Family history of breast cancer   . Family history of colon cancer   . Family history of pancreatic cancer   . Breast cancer of upper-outer quadrant of right female breast ([place]) 11/23/2014  . Allergy to ACE inhibitors 09/27/2014  . Encounter for Medicare annual wellness exam 06/27/2014  . Hyperglycemia 06/23/2013  . Routine general medical examination at a health care facility 06/23/2013  . Dizziness and giddiness 01/16/2013  . Disturbance of skin sensation 01/16/2013  . RLS (restless legs syndrome) 01/15/2013  . Hx of Clostridium difficile infection 10/10/2012  . Bronchiectasis without acute exacerbation ([place]) 04/29/2012  . Dyspnea on exertion 04/02/2012  . Non-ischemic cardiomyopathy ([place]) 03/07/2012  . Colon cancer screening 01/11/2012  .  Post-menopausal 01/11/2012  . Chest pain 11/07/2011  . Palpitations 04/17/2011  . Abnormal tympanic membrane 01/22/2011  . Obstructive sleep apnea 04/04/2010  . VENTRICULAR TACHYCARDIA 01/03/2009  . ICD  [employer]  Single chamber 01/03/2009  . PATENT FORAMEN OVALE 09/29/2008  . Vitamin D deficiency 08/17/2008  . Hypothyroidism 11/18/2006  . Hyperlipidemia 11/18/2006  . DEPRESSION 11/18/2006  . Essential hypertension 11/18/2006  . MITRAL VALVE PROLAPSE 11/18/2006  . CEREBRAL ANEURYSM 11/18/2006  . Allergic rhinitis 11/18/2006  . GERD 11/18/2006  . DIVERTICULOSIS, COLON 11/18/2006  . Fatty liver 11/18/2006  . Fibromyalgia 11/18/2006   Past Medical History:  Diagnosis Date  . AICD (automatic cardioverter/defibrillator) present   . Anxiety   . Arthritis   . 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 Cerebral aneurysm, nonruptured   . Clostridium difficile infection   . Depressive disorder, not elsewhere classified   . Diverticulosis of colon (without mention of hemorrhage)   . Esophageal reflux   . Fibromyalgia   . Gastritis   . Gastritis   . GERD (gastroesophageal reflux disease)   . GI bleed   . Hiatal hernia   . Hyperlipidemia   . Hypertension   . Hypothyroidism   . Internal hemorrhoids   . Obstructive sleep apnea (adult) (pediatric)   . Ostium secundum type atrial septal defect   . Other chronic nonalcoholic liver disease   . Other pulmonary embolism and infarction   . Paroxysmal ventricular tachycardia ([place])   .  Personal history of radiation therapy   . Pneumonia    history  . Presence of permanent cardiac pacemaker   . PUD (peptic ulcer disease)   . Radiation 02/03/15-03/10/15   Right Breast  . Sarcoid   . Schatzki's ring   . Stroke [facility]) 2013   tia  . Takotsubo syndrome   . Tubular adenoma of colon   . Unspecified transient cerebral ischemia   . Unspecified vitamin D deficiency    Past Surgical History:  Procedure Laterality Date  . ABDOMINAL  HYSTERECTOMY    . ABI  2006   normal  . BREAST EXCISIONAL BIOPSY    . BREAST LUMPECTOMY     right breast 2016  . BREAST LUMPECTOMY WITH RADIOACTIVE SEED LOCALIZATION Right 01/03/2015   Procedure: BREAST LUMPECTOMY WITH RADIOACTIVE SEED LOCALIZATION;  Surgeon: Autumn Messing III, MD;  Location: [place];  Service: General;  Laterality: Right;  . CARDIAC DEFIBRILLATOR PLACEMENT  2006; 2012   BSX single chamber ICD  . carotid dopplers  2006   neg  . CEREBRAL ANEURYSM REPAIR  02/1999  . hospitalization  2004   GI bleed, PUD, diverticulosis (EGD,colonscopy)  . hospitalization     PE, NSVT, s/p defib  . KNEE ARTHROSCOPY     bilateral  . LEFT HEART CATHETERIZATION WITH CORONARY ANGIOGRAM N/A 02/22/2012   Procedure: LEFT HEART CATHETERIZATION WITH CORONARY ANGIOGRAM;  Surgeon: Burnell Blanks, MD;  Location: [facility] CATH LAB;  Service: Cardiovascular;  Laterality: N/A;  . PARTIAL HYSTERECTOMY     Fibroids  . TOTAL KNEE ARTHROPLASTY Left 02/13/2016   Procedure: TOTAL KNEE ARTHROPLASTY;  Surgeon: Vickey Huger, MD;  Location: [place];  Service: Orthopedics;  Laterality: Left;   Social History  Substance Use Topics  . Smoking status: Former Smoker    Packs/day: 1.00    Years: 15.00    Types: Cigarettes    Quit date: 08/20/1989  . Smokeless tobacco: Never Used     Comment: Started at 48; less than 1 PPD  . Alcohol use 0.0 oz/week     Comment: rare   Family History  Problem Relation Age of Onset  . Diabetes Mother   . Alzheimer's disease Mother   . Other Brother        Thyroid problem 10/2016  . Allergies Sister   . Heart disease Sister   . Heart disease Brother   . Prostate cancer Brother 68       same brother as throat cancer  . Throat cancer Brother        dx in his 83s; also a smoker  . Heart disease Father   . Cancer Maternal Grandmother 60       colon cancer or abdominal cancer  . Lung cancer Maternal Grandfather 30  . Breast cancer Maternal Aunt 46  . Colon cancer Maternal Aunt 20        same sister as breast at 16  . Lung cancer Maternal Uncle   . Breast cancer Maternal Aunt        dx in her 43s  . Pancreatic cancer Cousin 42       maternal first cousin  . Breast cancer Cousin        paternal first cousin twice removed died in her 48s  . Cardiomyopathy Unknown        Family history  . Heart attack Sister   . Hypertension Sister   . Hypertension Brother   . Heart disease Son  Cardiac Arrest 07/2016  . Stroke Neg Hx    Allergies  Allergen Reactions  . Ace Inhibitors Swelling    Angioedema; makes tongue "break out" Angioedema; makes tongue "break out"  . Atorvastatin Other (See Comments)    SEVERE MYALGIAS, MUSCLE PAIN SEVERE MYALGIAS, MUSCLE PAIN  . Ramipril Other (See Comments)    TONGUE ULCERS TONGUE ULCERS  . Rosuvastatin Other (See Comments)    MYALGIAS, SEVERE MUSCLE PAIN MYALGIAS, SEVERE MUSCLE PAIN  . Amitriptyline Hcl Other (See Comments)    Unknown  . Amitriptyline Hcl Other (See Comments)    Unknown  . Iodine-131 Other (See Comments)  . Paroxetine Other (See Comments)    UNKNOWN UNKNOWN  . Carbamazepine Rash    REACTION: ? reaction REACTION: ? reaction  . Cetirizine Hcl Other (See Comments)    REACTION: sleepy  . Cetirizine Hcl Other (See Comments)    REACTION: sleepy  . Codeine Itching  . Dilantin [Phenytoin Sodium Extended] Rash  . Phenytoin Sodium Extended Rash   Current Outpatient Prescriptions on File Prior to Visit  Medication Sig Dispense Refill  . acetaminophen (TYLENOL) 500 MG tablet Take 500 mg by mouth every 6 (six) hours as needed (pain).     [name] aspirin 81 MG tablet Take 81 mg by mouth daily.    . baclofen (LIORESAL) 10 MG tablet Take 1 tablet (10 mg total) by mouth 3 (three) times daily. 270 tablet 1  . carvedilol (COREG) 12.5 MG tablet Take 1 tablet (12.5 mg total) by mouth 2 (two) times daily. 180 tablet 3  . cholecalciferol (VITAMIN D) 1000 UNITS tablet Take 2,000 Units by mouth daily.    . diclofenac sodium  (VOLTAREN) 1 % GEL Voltaren Gel 3 grams to 3 large joints upto TID 3 TUBES with 3 refills 3 Tube 3  . fexofenadine (ALLEGRA) 180 MG tablet Take 180 mg by mouth daily as needed for allergies or rhinitis.    . fluticasone (FLONASE) 50 MCG/ACT nasal spray Place 2 sprays into both nostrils daily as needed for allergies or rhinitis. 48 g 3  . furosemide (LASIX) 40 MG tablet Take 1 tablet by mouth once daily as needed for increased swelling/ shortness of breath (Patient taking differently: Take 1 tablet by mouth every other day as needed for increased swelling/ shortness of breath) 90 tablet 3  . latanoprost (XALATAN) 0.005 % ophthalmic solution Place 1 drop into both eyes at bedtime.    [name] levothyroxine (SYNTHROID, LEVOTHROID) 50 MCG tablet Take 1 tablet (50 mcg total) by mouth daily. 90 tablet 3  . mometasone (ELOCON) 0.1 % lotion     . montelukast (SINGULAIR) 10 MG tablet Take 1 tablet (10 mg total) by mouth at bedtime. 90 tablet 3  . Multiple Vitamins-Minerals (MULTIVITAMIN PO) Take 1 tablet by mouth daily.    . pantoprazole (PROTONIX) 40 MG tablet Take 1 tablet (40 mg total) by mouth daily. 90 tablet 0  . polyethylene glycol powder (GLYCOLAX/MIRALAX) powder Take 17 grams by mouth daily 1080 g 0  . potassium chloride (K-DUR) 10 MEQ tablet TAKE 1/2 TABLET (5MEQ TOTAL) EVERY DAY 45 tablet 1  . simvastatin (ZOCOR) 40 MG tablet Take 0.5 tablets (20 mg total) by mouth daily. 45 tablet 3  . zolpidem (AMBIEN) 10 MG tablet TAKE ONE TABLET BY MOUTH AT BEDTIME AS NEEDED FOR SLEEP 90 tablet 1  . [DISCONTINUED] metoprolol succinate (TOPROL-XL) 50 MG 24 hr tablet Take 2 tablets (100 mg total) by mouth daily. Take with or immediately following  a meal. 90 tablet 3   No current facility-administered medications on file prior to visit.      Review of Systems  Constitutional: Positive for fatigue. Negative for appetite change and fever.  HENT: Positive for congestion, ear pain, postnasal drip, rhinorrhea, sinus  pressure and sore throat. Negative for nosebleeds.   Eyes: Negative for pain, redness and itching.  Respiratory: Positive for cough. Negative for shortness of breath and wheezing.   Cardiovascular: Negative for chest pain.  Gastrointestinal: Negative for abdominal pain, diarrhea, nausea and vomiting.  Endocrine: Negative for polyuria.  Genitourinary: Negative for dysuria, frequency and urgency.  Musculoskeletal: Negative for arthralgias and myalgias.  Allergic/Immunologic: Negative for immunocompromised state.  Neurological: Positive for headaches. Negative for dizziness, tremors, syncope, weakness and numbness.  Hematological: Negative for adenopathy. Does not bruise/bleed easily.  Psychiatric/Behavioral: Negative for dysphoric mood. The patient is not nervous/anxious.        Objective:   Physical Exam  Constitutional: She appears well-developed and well-nourished. No distress.  HENT:  Head: Normocephalic and atraumatic.  Right Ear: External ear normal.  Left Ear: External ear normal.  Mouth/Throat: Oropharynx is clear and moist. No oropharyngeal exudate.  Nares are injected and congested  Bilateral maxillary sinus tenderness (worse on the L) Post nasal drip   Eyes: Pupils are equal, round, and reactive to light. Conjunctivae and EOM are normal. Right eye exhibits no discharge. Left eye exhibits no discharge.  Neck: Normal range of motion. Neck supple.  Cardiovascular: Normal rate and regular rhythm.   Murmur heard. Pulmonary/Chest: Effort normal and breath sounds normal. No respiratory distress. She has no wheezes. She has no rales.  Harsh bs at bases (baseline)  Lymphadenopathy:    She has no cervical adenopathy.  Neurological: She is alert. No cranial nerve deficit.  Skin: Skin is warm and dry. No rash noted.  Psychiatric: She has a normal mood and affect.          Assessment & Plan:   Problem List Items Addressed This Visit      Respiratory   Acute sinusitis     With 2 wk of congestion and purulent nasal drainage  Cover with augmentin  Disc symptomatic care - see instructions on AVS Update if not starting to improve in a week or if worsening    She will get back on flonase daily       Relevant Medications   amoxicillin-clavulanate (AUGMENTIN) 875-125 MG tablet

## 2017-05-17 NOTE — Assessment & Plan Note (Signed)
With 2 wk of congestion and purulent nasal drainage  Cover with augmentin  Disc symptomatic care - see instructions on AVS Update if not starting to improve in a week or if worsening    She will get back on flonase daily

## 2017-05-17 NOTE — Patient Instructions (Signed)
For sinus infection  Drink lots of fluids  Breathe steam  Use nasal saline  Add back flonase nasal spray every day  mucinex for congestion as needed  Allegra if helpful   Take the augmentin as directed    Update if not starting to improve in a week or if worsening

## 2017-05-27 ENCOUNTER — Other Ambulatory Visit: Payer: Self-pay | Admitting: Family Medicine

## 2017-05-27 ENCOUNTER — Other Ambulatory Visit: Payer: Self-pay | Admitting: Internal Medicine

## 2017-05-27 DIAGNOSIS — K3189 Other diseases of stomach and duodenum: Secondary | ICD-10-CM

## 2017-05-27 DIAGNOSIS — K31A0 Gastric intestinal metaplasia, unspecified: Secondary | ICD-10-CM

## 2017-05-28 NOTE — Telephone Encounter (Signed)
Is this request early?

## 2017-05-28 NOTE — Telephone Encounter (Signed)
Pt has AWV with lisa scheduled for 11/27/17, last OV was an acute appt on 05/17/17, last filled 12/07/16 #90 with 1 additional refill, please advise

## 2017-05-28 NOTE — Telephone Encounter (Signed)
Rx declined due to being 10 days early

## 2017-05-30 ENCOUNTER — Other Ambulatory Visit (INDEPENDENT_AMBULATORY_CARE_PROVIDER_SITE_OTHER): Payer: Medicare Other

## 2017-05-30 ENCOUNTER — Ambulatory Visit (INDEPENDENT_AMBULATORY_CARE_PROVIDER_SITE_OTHER): Payer: Medicare Other | Admitting: Pulmonary Disease

## 2017-05-30 ENCOUNTER — Encounter: Payer: Self-pay | Admitting: Pulmonary Disease

## 2017-05-30 VITALS — BP 118/68 | HR 71 | Ht 66.0 in | Wt 220.2 lb

## 2017-05-30 DIAGNOSIS — Z9989 Dependence on other enabling machines and devices: Secondary | ICD-10-CM

## 2017-05-30 DIAGNOSIS — J479 Bronchiectasis, uncomplicated: Secondary | ICD-10-CM | POA: Diagnosis not present

## 2017-05-30 DIAGNOSIS — J309 Allergic rhinitis, unspecified: Secondary | ICD-10-CM | POA: Diagnosis not present

## 2017-05-30 DIAGNOSIS — Z23 Encounter for immunization: Secondary | ICD-10-CM | POA: Diagnosis not present

## 2017-05-30 DIAGNOSIS — G4733 Obstructive sleep apnea (adult) (pediatric): Secondary | ICD-10-CM | POA: Diagnosis not present

## 2017-05-30 LAB — CBC WITH DIFFERENTIAL/PLATELET
BASOS ABS: 0 10*3/uL (ref 0.0–0.1)
Basophils Relative: 1.1 % (ref 0.0–3.0)
EOS ABS: 0.2 10*3/uL (ref 0.0–0.7)
Eosinophils Relative: 4.5 % (ref 0.0–5.0)
HEMATOCRIT: 41.8 % (ref 36.0–46.0)
Hemoglobin: 13.5 g/dL (ref 12.0–15.0)
LYMPHS ABS: 0.9 10*3/uL (ref 0.7–4.0)
LYMPHS PCT: 21.5 % (ref 12.0–46.0)
MCHC: 32.4 g/dL (ref 30.0–36.0)
MCV: 90.2 fl (ref 78.0–100.0)
MONOS PCT: 9.8 % (ref 3.0–12.0)
Monocytes Absolute: 0.4 10*3/uL (ref 0.1–1.0)
NEUTROS PCT: 63.1 % (ref 43.0–77.0)
Neutro Abs: 2.7 10*3/uL (ref 1.4–7.7)
Platelets: 221 10*3/uL (ref 150.0–400.0)
RBC: 4.63 Mil/uL (ref 3.87–5.11)
RDW: 13.5 % (ref 11.5–15.5)
WBC: 4.2 10*3/uL (ref 4.0–10.5)

## 2017-05-30 MED ORDER — ALBUTEROL SULFATE HFA 108 (90 BASE) MCG/ACT IN AERS: 2.0000 | INHALATION_SPRAY | Freq: Four times a day (QID) | RESPIRATORY_TRACT | 5 refills | Status: DC | PRN

## 2017-05-30 NOTE — Progress Notes (Signed)
Current Outpatient Prescriptions on File Prior to Visit  Medication Sig  . acetaminophen (TYLENOL) 500 MG tablet Take 500 mg by mouth every 6 (six) hours as needed (pain).   [name] aspirin 81 MG tablet Take 81 mg by mouth daily.  . baclofen (LIORESAL) 10 MG tablet Take 1 tablet (10 mg total) by mouth 3 (three) times daily.  . carvedilol (COREG) 12.5 MG tablet Take 1 tablet (12.5 mg total) by mouth 2 (two) times daily.  . cholecalciferol (VITAMIN D) 1000 UNITS tablet Take 2,000 Units by mouth daily.  . fexofenadine (ALLEGRA) 180 MG tablet Take 180 mg by mouth daily as needed for allergies or rhinitis.  . fluticasone (FLONASE) 50 MCG/ACT nasal spray Place 2 sprays into both nostrils daily as needed for allergies or rhinitis.  . furosemide (LASIX) 40 MG tablet Take 1 tablet by mouth once daily as needed for increased swelling/ shortness of breath (Patient taking differently: Take 1 tablet by mouth every other day as needed for increased swelling/ shortness of breath)  . latanoprost (XALATAN) 0.005 % ophthalmic solution Place 1 drop into both eyes at bedtime.  [name] levothyroxine (SYNTHROID, LEVOTHROID) 50 MCG tablet Take 1 tablet (50 mcg total) by mouth daily.  . mometasone (ELOCON) 0.1 % lotion   . montelukast (SINGULAIR) 10 MG tablet Take 1 tablet (10 mg total) by mouth at bedtime.  . Multiple Vitamins-Minerals (MULTIVITAMIN PO) Take 1 tablet by mouth daily.  . pantoprazole (PROTONIX) 40 MG tablet TAKE 1 TABLET EVERY DAY  . polyethylene glycol powder (GLYCOLAX/MIRALAX) powder Take 17 grams by mouth daily  . potassium chloride (K-DUR) 10 MEQ tablet TAKE 1/2 TABLET (5MEQ TOTAL) EVERY DAY  . simvastatin (ZOCOR) 40 MG tablet Take 0.5 tablets (20 mg total) by mouth daily.  [name] zolpidem (AMBIEN) 10 MG tablet TAKE ONE TABLET BY MOUTH AT BEDTIME AS NEEDED FOR SLEEP  . [DISCONTINUED] metoprolol succinate (TOPROL-XL) 50 MG 24 hr tablet Take 2 tablets (100 mg total) by mouth daily. Take with or immediately following a  meal.   No current facility-administered medications on file prior to visit.      Chief Complaint  Patient presents with  . Follow-up    Pt has productive cough in the mornings thick yellow mucus; during the day is a dry cough. Pt is still having SOB more with exertion. Sleeping is on and off, still not sleeping well. Pt states having some issues with the cpap machine.     Sleep tests PSG 11/29/08 >> AHI 9 HST 11/10/15 >> AHI 7.1, SaO2 low 75% Auto CPAP 04/28/17 to 05/27/17 >> used on 21 of 30 nights with average 5 hrs 51 min.  Average AHI 1.7 with CPAP 7 cm H2O.  Pulmonary tests CT chest 04/10/12 >> b/l lower lobe cylindrical BTX and some in upper lobes PFT 04/17/12 >> FEV1 2.60 (110%), FEV1% 85, TLC 4.05 (77%), DLCO 44% CT angio chest 10/26/15 >> patchy GGO in periphery of lungs b/l, basilar BTX, no PE; no significant change compared to 2013 PFT 01/17/17 >> FEV1 2.38 (111%), FEV1% 90, TLC 3.85 (71%), DLCO 58%, no BD  Cardiac tests Echo 05/13/17 >> mild LVH, EF 55 to 60%, grade 1 DD, mild TR  Past medical history VT s/p AICD, ASD, CVA, Tako tsubo CM, PE 2005, NASH, Hypothyroidism, GERD, Diverticulosis, Depression, C diff, Anxiety, Breast cancer 2016, Nummular eczema  Past surgical history, Family history, Social history, Allergies reviewed.  Vital Signs BP 118/68 (BP Location: Left Arm, Cuff Size: Normal)  Pulse 71   Ht 5\' 6"  (1.676 m)   Wt 220 lb 3.2 oz (99.9 kg)   LMP  (LMP Unknown)   SpO2 96%   BMI 35.54 kg/m   History of Present Illness [name] is a 66 y.o. female former smoker with bronchiectasis and obstructive sleep apnea.  She had to wait to get her CPAP mask.  She is now using a full face mask.  She was getting more sinus congestion and hoarseness when using nasal mask.    She still has nasal congestion and post nasal drip.  She coughs up yellow sputum.  She also gets chest congestion and wheezing.  She gets hot at night.  She is not having fever, hemoptysis,  chest pain, swelling, or skin rash.  She uses allegra, flonase, singulair, and nasal irrigation.  She doesn't have an inhaler for her lungs.  Physical Exam  General - pleasant Eyes - pupils reactive ENT - no sinus tenderness, no oral exudate, no LAN Cardiac - regular, no murmur Chest - no wheeze, rales Abd - soft, non tender Ext - no edema Skin - no rashes Neuro - normal strength Psych - normal mood  Assessment/Plan  Upper airway cough syndrome with allergic rhinitis. - continue singulair, allegra, flonase, nasal irrigation - will check CBC with diff, RAST with IgE  Obstructive sleep apnea. - she is compliant with CPAP and reports benefit from therapy - continue CPAP 7 cm H2O  Bronchiectasis. - continue mucinex - add ventolin - check IgA, IgM, IgG   Patient Instructions  Lab tests today  Ventolin 2 puffs every 6 hours as needed for cough, wheeze, or chest congestion  High dose flu shot today  Follow up in 4 months   Chesley Mires, MD [facility] Pager:  [phone] 05/30/2017, 10:37 AM

## 2017-05-30 NOTE — Patient Instructions (Signed)
Lab tests today  Ventolin 2 puffs every 6 hours as needed for cough, wheeze, or chest congestion  High dose flu shot today  Follow up in 4 months

## 2017-05-31 LAB — IGG, IGA, IGM
IGG (IMMUNOGLOBIN G), SERUM: 1650 mg/dL — AB (ref 694–1618)
IGM, SERUM: 84 mg/dL (ref 48–271)
Immunoglobulin A: 143 mg/dL (ref 81–463)

## 2017-06-02 LAB — ALLERGEN PROFILE, PERENNIAL ALLERGEN IGE
Alternaria Alternata IgE: <0.1 kU/L
Aspergillus Fumigatus IgE: <0.1 kU/L
Candida Albicans IgE: <0.1 kU/L
Cat Dander IgE: <0.1 kU/L
Cladosporium Herbarum IgE: <0.1 kU/L
D Farinae IgE: 2.67 kU/L — AB
D Pteronyssinus IgE: 1.43 kU/L — AB
Goose Feathers IgE: <0.1 kU/L
Mouse Urine IgE: <0.1 kU/L
Penicillium Chrysogen IgE: <0.1 kU/L
Stemphylium Herbarum IgE: <0.1 kU/L

## 2017-06-04 ENCOUNTER — Other Ambulatory Visit: Payer: Self-pay

## 2017-06-04 ENCOUNTER — Telehealth: Payer: Self-pay | Admitting: Pulmonary Disease

## 2017-06-04 ENCOUNTER — Encounter: Payer: Self-pay | Admitting: Neurology

## 2017-06-04 ENCOUNTER — Ambulatory Visit (INDEPENDENT_AMBULATORY_CARE_PROVIDER_SITE_OTHER): Payer: Medicare Other | Admitting: Neurology

## 2017-06-04 ENCOUNTER — Ambulatory Visit: Payer: Medicare Other | Admitting: Neurology

## 2017-06-04 VITALS — BP 125/74 | HR 72 | Ht 66.0 in | Wt 223.5 lb

## 2017-06-04 DIAGNOSIS — R200 Anesthesia of skin: Secondary | ICD-10-CM

## 2017-06-04 DIAGNOSIS — R208 Other disturbances of skin sensation: Secondary | ICD-10-CM

## 2017-06-04 NOTE — Patient Instructions (Signed)
   We will check EMG and NCV to evaluate the nerve function of the legs.

## 2017-06-04 NOTE — Progress Notes (Signed)
Reason for visit: Foot numbness  Referring physician: Dr. Saverio Danker [name] is a 66 y.o. female  History of present illness:  [name] is a 66 year old right-handed black female with a history of a left total knee replacement in June 2017. The patient claims that around that time she noted onset of back pain and left hip and buttock pain and pain down the left leg. She has had improvement in pain since that time, but she continues to have some discomfort and some numbness in the left foot that mainly involves the toe. She has right foot pain as well, but this is pain associated with weightbearing, she does not have much numbness of the right foot. She is being followed through podiatry for the right foot. The patient denies any definite weakness of the legs, she has no severe change in balance. She denies issues controlling the bowels or the bladder. She denies any severe neck pain, she does have some slight discomfort, she denies any pain or numbness down the arms. Given the ongoing symptoms, the patient is sent to this office for further evaluation.  Past Medical History:  Diagnosis Date  . AICD (automatic cardioverter/defibrillator) present   . Anxiety   . Arthritis   . 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 Cerebral aneurysm, nonruptured   . Clostridium difficile infection   . Depressive disorder, not elsewhere classified   . Diverticulosis of colon (without mention of hemorrhage)   . Esophageal reflux   . Fibromyalgia   . Gastritis   . Gastritis   . GERD (gastroesophageal reflux disease)   . GI bleed   . Hiatal hernia   . Hyperlipidemia   . Hypertension   . Hypothyroidism   . Internal hemorrhoids   . Obstructive sleep apnea (adult) (pediatric)   . Ostium secundum type atrial septal defect   . Other chronic nonalcoholic liver disease   . Other pulmonary embolism and infarction   . Paroxysmal ventricular tachycardia ([place])   . Personal history of radiation therapy    . Pneumonia    history  . Presence of permanent cardiac pacemaker   . PUD (peptic ulcer disease)   . Radiation 02/03/15-03/10/15   Right Breast  . Sarcoid   . Schatzki's ring   . Stroke [facility]) 2013   tia  . Takotsubo syndrome   . Tubular adenoma of colon   . Unspecified transient cerebral ischemia   . Unspecified vitamin D deficiency     Past Surgical History:  Procedure Laterality Date  . ABDOMINAL HYSTERECTOMY    . ABI  2006   normal  . BREAST EXCISIONAL BIOPSY    . BREAST LUMPECTOMY     right breast 2016  . BREAST LUMPECTOMY WITH RADIOACTIVE SEED LOCALIZATION Right 01/03/2015   Procedure: BREAST LUMPECTOMY WITH RADIOACTIVE SEED LOCALIZATION;  Surgeon: Autumn Messing III, MD;  Location: [facility];  Service: General;  Laterality: Right;  . CARDIAC DEFIBRILLATOR PLACEMENT  2006; 2012   BSX single chamber ICD  . carotid dopplers  2006   neg  . CEREBRAL ANEURYSM REPAIR  02/1999  . hospitalization  2004   GI bleed, PUD, diverticulosis (EGD,colonscopy)  . hospitalization     PE, NSVT, s/p defib  . KNEE ARTHROSCOPY     bilateral  . LEFT HEART CATHETERIZATION WITH CORONARY ANGIOGRAM N/A 02/22/2012   Procedure: LEFT HEART CATHETERIZATION WITH CORONARY ANGIOGRAM;  Surgeon: Burnell Blanks, MD;  Location: [facility] CATH LAB;  Service:  Cardiovascular;  Laterality: N/A;  . PARTIAL HYSTERECTOMY     Fibroids  . TOTAL KNEE ARTHROPLASTY Left 02/13/2016   Procedure: TOTAL KNEE ARTHROPLASTY;  Surgeon: Vickey Huger, MD;  Location: [facility];  Service: Orthopedics;  Laterality: Left;    Family History  Problem Relation Age of Onset  . Diabetes Mother   . Alzheimer's disease Mother   . Other Brother        Thyroid problem 10/2016  . Allergies Sister   . Heart disease Sister   . Heart disease Brother   . Prostate cancer Brother 65       same brother as throat cancer  . Throat cancer Brother        dx in his 9s; also a smoker  . Heart disease Father   . Cancer Maternal Grandmother 57       colon  cancer or abdominal cancer  . Lung cancer Maternal Grandfather 44  . Breast cancer Maternal Aunt 64  . Colon cancer Maternal Aunt 40       same sister as breast at 20  . Lung cancer Maternal Uncle   . Breast cancer Maternal Aunt        dx in her 77s  . Pancreatic cancer Cousin 45       maternal first cousin  . Breast cancer Cousin        paternal first cousin twice removed died in her 71s  . Cardiomyopathy Unknown        Family history  . Heart attack Sister   . Hypertension Sister   . Hypertension Brother   . Heart disease Son        Cardiac Arrest 07/2016  . Stroke Neg Hx     Social history:  reports that she quit smoking about 27 years ago. Her smoking use included Cigarettes. She has a 15.00 pack-year smoking history. She has never used smokeless tobacco. She reports that she drinks alcohol. She reports that she does not use drugs.  Medications:  Prior to Admission medications   Medication Sig Start Date End Date Taking? Authorizing Provider  acetaminophen (TYLENOL) 500 MG tablet Take 500 mg by mouth every 6 (six) hours as needed (pain).    Yes [provider]  albuterol (VENTOLIN HFA) 108 (90 Base) MCG/ACT inhaler Inhale 2 puffs into the lungs every 6 (six) hours as needed for wheezing or shortness of breath. 05/30/17  Yes Chesley Mires, MD  aspirin 81 MG tablet Take 81 mg by mouth daily.   Yes [provider]  baclofen (LIORESAL) 10 MG tablet Take 1 tablet (10 mg total) by mouth 3 (three) times daily. 02/27/17 08/26/17 Yes Panwala, Naitik, PA-C  carvedilol (COREG) 12.5 MG tablet Take 1 tablet (12.5 mg total) by mouth 2 (two) times daily. 05/09/17 08/07/17 Yes Deboraha Sprang, MD  cholecalciferol (VITAMIN D) 1000 UNITS tablet Take 2,000 Units by mouth daily.   Yes [provider]  fexofenadine (ALLEGRA) 180 MG tablet Take 180 mg by mouth daily as needed for allergies or rhinitis.   Yes [provider]  fluticasone (FLONASE) 50 MCG/ACT nasal spray  Place 2 sprays into both nostrils daily as needed for allergies or rhinitis. 11/30/16  Yes Tower, [name], MD  furosemide (LASIX) 40 MG tablet Take 1 tablet by mouth once daily as needed for increased swelling/ shortness of breath Patient taking differently: Take 1 tablet by mouth every other day as needed for increased swelling/ shortness of breath 05/10/17  Yes  Deboraha Sprang, MD  latanoprost (XALATAN) 0.005 % ophthalmic solution Place 1 drop into both eyes at bedtime.   Yes [provider]  levothyroxine (SYNTHROID, LEVOTHROID) 50 MCG tablet Take 1 tablet (50 mcg total) by mouth daily. 11/30/16  Yes Tower, [name], MD  mometasone (ELOCON) 0.1 % lotion  02/19/17  Yes [provider]  montelukast (SINGULAIR) 10 MG tablet Take 1 tablet (10 mg total) by mouth at bedtime. 11/20/16  Yes Parrett, Tammy S, NP  Multiple Vitamins-Minerals (MULTIVITAMIN PO) Take 1 tablet by mouth daily.   Yes [provider]  pantoprazole (PROTONIX) 40 MG tablet TAKE 1 TABLET EVERY DAY 05/28/17  Yes Pyrtle, Lajuan Lines, MD  polyethylene glycol powder (GLYCOLAX/MIRALAX) powder Take 17 grams by mouth daily 02/27/16  Yes Pyrtle, Lajuan Lines, MD  potassium chloride (K-DUR) 10 MEQ tablet TAKE 1/2 TABLET (5MEQ TOTAL) EVERY DAY 12/18/16  Yes Tower, [name], MD  simvastatin (ZOCOR) 40 MG tablet Take 0.5 tablets (20 mg total) by mouth daily. 06/14/16  Yes Simmons, Brittainy M, PA-C  zolpidem (AMBIEN) 10 MG tablet TAKE ONE TABLET BY MOUTH AT BEDTIME AS NEEDED FOR SLEEP 12/07/16  Yes Tower, [name], MD      Allergies  Allergen Reactions  . Ace Inhibitors Swelling    Angioedema; makes tongue "break out" Angioedema; makes tongue "break out"  . Atorvastatin Other (See Comments)    SEVERE MYALGIAS, MUSCLE PAIN SEVERE MYALGIAS, MUSCLE PAIN  . Ramipril Other (See Comments)    TONGUE ULCERS TONGUE ULCERS  . Rosuvastatin Other (See Comments)    MYALGIAS, SEVERE MUSCLE PAIN MYALGIAS, SEVERE MUSCLE PAIN  . Amitriptyline Hcl  Other (See Comments)    Unknown  . Amitriptyline Hcl Other (See Comments)    Unknown  . Iodine-131 Other (See Comments)  . Paroxetine Other (See Comments)    UNKNOWN UNKNOWN  . Carbamazepine Rash    REACTION: ? reaction REACTION: ? reaction  . Cetirizine Hcl Other (See Comments)    REACTION: sleepy  . Cetirizine Hcl Other (See Comments)    REACTION: sleepy  . Codeine Itching  . Dilantin [Phenytoin Sodium Extended] Rash  . Phenytoin Sodium Extended Rash    ROS:  Out of a complete 14 system review of symptoms, the patient complains only of the following symptoms, and all other reviewed systems are negative.  Cough, shortness of breath Palpitations of the heart Heat intolerance Nausea Restless legs, sleep apnea, snoring Environmental allergies Joint pain, back pain, neck stiffness Dizziness  Blood pressure 125/74, pulse 72, height 5\' 6"  (1.676 m), weight 223 lb 8 oz (101.4 kg).  Physical Exam  General: The patient is alert and cooperative at the time of the examination.  Eyes: Pupils are equal, round, and reactive to light. Discs are flat bilaterally.  Neck: The neck is supple, no carotid bruits are noted.  Respiratory: The respiratory examination is clear.  Cardiovascular: The cardiovascular examination reveals a regular rate and rhythm, no obvious murmurs or rubs are noted.  Skin: Extremities are without significant edema.  Neurologic Exam  Mental status: The patient is alert and oriented x 3 at the time of the examination. The patient has apparent normal recent and remote memory, with an apparently normal attention span and concentration ability.  Cranial nerves: Facial symmetry is present. There is good sensation of the face to pinprick and soft touch bilaterally. The strength of the facial muscles and the muscles to head turning and shoulder shrug are normal bilaterally. Speech is well enunciated,  no aphasia or dysarthria is noted. Extraocular movements are  full. Visual fields are full. The tongue is midline, and the patient has symmetric elevation of the soft palate. No obvious hearing deficits are noted.  Motor: The motor testing reveals 5 over 5 strength of all 4 extremities. Good symmetric motor tone is noted throughout.  Sensory: Sensory testing is intact to pinprick, soft touch, vibration sensation, and position sense on all 4 extremities. No evidence of extinction is noted.  Coordination: Cerebellar testing reveals good finger-nose-finger and heel-to-shin bilaterally.  Gait and station: Gait is slightly wide-based. The patient walks independently. Tandem gait is slightly unsteady. Romberg is negative. No drift is seen. The patient can walk on heels and the toes bilaterally.  Reflexes: Deep tendon reflexes are symmetric, but are slightly depressed bilaterally. Toes are downgoing bilaterally.   Assessment/Plan:  1. Back pain, left leg pain, left foot numbness  The patient will be set up for EMG and nerve conduction study evaluation. The symptoms could be consistent with a very low-grade L5 radiculopathy. The patient does have some right foot discomfort as well. Nerve conduction studies will be done on both legs, EMG on the left leg. She will follow-up for the above study.  Jill Alexanders MD 06/04/2017 3:02 PM  [facility] [address] [place], [place] [address]  Phone [phone] Fax [phone]

## 2017-06-04 NOTE — Telephone Encounter (Signed)
Pt request cb about zolpidem refill being denied; advised pt zolpidem was requested 05/27/17 which was 12 days early; pt said she last got zolpidem on 03/04/17 and request a refill; last annual 12/01/14.on med list last refilled # 90 x 1 on 12/07/16. [facility] [address]

## 2017-06-04 NOTE — Telephone Encounter (Signed)
RAST 05/30/17 >> dust mites   Please let her know that she had reaction to dust mites on allergy blood test.  She should continue her current medications.

## 2017-06-05 ENCOUNTER — Ambulatory Visit (INDEPENDENT_AMBULATORY_CARE_PROVIDER_SITE_OTHER): Payer: Medicare Other | Admitting: *Deleted

## 2017-06-05 DIAGNOSIS — I472 Ventricular tachycardia, unspecified: Secondary | ICD-10-CM

## 2017-06-05 MED ORDER — ZOLPIDEM TARTRATE 10 MG PO TABS: 10.0000 mg | ORAL_TABLET | Freq: Every evening | ORAL | 1 refills | Status: DC | PRN

## 2017-06-05 NOTE — Telephone Encounter (Signed)
Rx called to pharmacy as instructed. Patient notified by telephone that script has been called to pharmacy.

## 2017-06-05 NOTE — Telephone Encounter (Signed)
Px written for call in   

## 2017-06-05 NOTE — Telephone Encounter (Signed)
Called and spoke with patient today regarding results per results and recommendation for lab tests regarding dust mites on allergy blood test.  Informed the patient of her results today and recommendations per vs. The patient verbalized understanding and denied any questions or concerns at this time. Nothing further needed.

## 2017-06-06 NOTE — Progress Notes (Signed)
Remote ICD transmission.   

## 2017-06-07 ENCOUNTER — Encounter: Payer: Self-pay | Admitting: Cardiology

## 2017-06-24 ENCOUNTER — Ambulatory Visit (INDEPENDENT_AMBULATORY_CARE_PROVIDER_SITE_OTHER): Payer: Medicare Other | Admitting: Neurology

## 2017-06-24 ENCOUNTER — Encounter: Payer: Self-pay | Admitting: Neurology

## 2017-06-24 DIAGNOSIS — R208 Other disturbances of skin sensation: Secondary | ICD-10-CM | POA: Diagnosis not present

## 2017-06-24 DIAGNOSIS — M25551 Pain in right hip: Secondary | ICD-10-CM

## 2017-06-24 DIAGNOSIS — R202 Paresthesia of skin: Secondary | ICD-10-CM

## 2017-06-24 DIAGNOSIS — R200 Anesthesia of skin: Secondary | ICD-10-CM

## 2017-06-24 NOTE — Progress Notes (Addendum)
The patient comes in today for EMG nerve conduction study evaluation.  No clear evidence of a neuropathy seen, EMG does not show evidence of a left lumbosacral radiculopathy.  The patient has begun complaining of significant pain in the right groin area, worse with flexion of the right hip.  This has developed within the last week.  She indicates that the pain is minimal when she is up walking.  The patient will be sent for an x-ray of the right hip.  The pain in the left leg has improved, she has residual discomfort in the left great toe.  MRI of the lumbar spine will be done in the future if the pain recurs.   [place]    Nerve / Sites Muscle Latency Ref. Amplitude Ref. Rel Amp Segments Distance Velocity Ref. Area    ms ms mV mV %  cm m/s m/s mVms  L Peroneal - EDB     Ankle EDB 4.7 ?6.5 3.9 ?2.0 100 Ankle - EDB 9   5.6     Fib head EDB 12.2  2.6  68.4 Fib head - Ankle 38 51 ?44 5.3     Pop fossa EDB 13.8  2.3  87 Pop fossa - Fib head 10 64 ?44 4.6         Pop fossa - Ankle      R Peroneal - EDB     Ankle EDB 4.0 ?6.5 2.4 ?2.0 100 Ankle - EDB 9   2.8     Fib head EDB 11.6  1.7  70.2 Fib head - Ankle 38 50 ?44 3.6     Pop fossa EDB 12.5  2.3  138 Pop fossa - Fib head 48 512 ?44 5.7         Pop fossa - Ankle      L Tibial - AH     Ankle AH 5.9 ?5.8 6.1 ?4.0 100 Ankle - AH 9   8.0     Pop fossa AH 15.1  4.3  70.7 Pop fossa - Ankle 45 49 ?41 9.4  R Tibial - AH     Ankle AH 6.1 ?5.8 5.7 ?4.0 100 Ankle - AH 9   11.4     Pop fossa AH 14.6  5.7  100 Pop fossa - Ankle 44 52 ?41 12.3             SNC    Nerve / Sites Rec. Site Peak Lat Ref.  Amp Ref. Segments Distance    ms ms V V  cm  R Superficial peroneal - Ankle     Lat leg Ankle 2.3 ?4.4 5 ?6 Lat leg - Ankle 14  L Superficial peroneal - Ankle     Lat leg Ankle 2.3 ?4.4 4 ?6 Lat leg - Ankle 14         H Reflex    Nerve H Lat Lat Hmax   ms ms   Left Right Ref. Left Right Ref.  Tibial - [place] NR NR ?35.0 NR NR ?35.0         EMG

## 2017-06-24 NOTE — Procedures (Signed)
     HISTORY:  [name] is a 66 year old patient with a history of left knee surgery.  Around the time of the surgery she developed some discomfort in the left hip down the left leg, she has had some residual numbness of the great toe on the left.  She is being evaluated for possible neuropathy or a lumbosacral radiculopathy.  NERVE CONDUCTION STUDIES:  Nerve conduction studies were performed on both lower extremities.  The distal motor latencies for the peroneal nerves are within normal limits bilaterally with normal motor amplitudes for these nerves bilaterally.  The distal motor latencies for the posterior tibial nerves were minimally prolonged bilaterally with normal motor amplitudes for these nerves. The nerve conduction velocities for the peroneal and posterior tibial nerves were within normal limits bilaterally.  Peroneal sensory latencies were within normal limits bilaterally.  The H reflex latencies were unobtainable bilaterally.  EMG STUDIES:  EMG study was performed on the left lower extremity:  The tibialis anterior muscle reveals 2 to 4K motor units with full recruitment. No fibrillations or positive waves were seen. The peroneus tertius muscle reveals 2 to 4K motor units with slightly decreased recruitment. No fibrillations or positive waves were seen. The medial gastrocnemius muscle reveals 1 to 3K motor units with full recruitment. No fibrillations or positive waves were seen. The vastus lateralis muscle reveals 2 to 4K motor units with full recruitment. No fibrillations or positive waves were seen. The iliopsoas muscle reveals 2 to 4K motor units with full recruitment. No fibrillations or positive waves were seen. The biceps femoris muscle (long head) reveals 2 to 4K motor units with full recruitment. No fibrillations or positive waves were seen. The lumbosacral paraspinal muscles were tested at 3 levels, and revealed no abnormalities of insertional activity at all 3 levels  tested. There was good relaxation.   IMPRESSION:  Nerve conduction studies done on both lower extremities were relatively unremarkable with exception of minimal prolongation of the posterior tibial distal latencies bilaterally.  No clear evidence of a peripheral neuropathy was seen.  EMG evaluation of the left lower extremity shows no significant abnormalities, no evidence of an overlying lumbosacral radiculopathy was seen.  Jill Alexanders MD 06/24/2017 11:25 AM  [facility] [address] [place], [place] [address]  Phone [phone] Fax [phone]

## 2017-06-24 NOTE — Progress Notes (Signed)
Please refer to EMG and nerve conduction study procedure note. 

## 2017-06-27 LAB — CUP PACEART REMOTE DEVICE CHECK
Battery Remaining Longevity: 72 mo
Battery Remaining Percentage: 76 %
Brady Statistic RV Percent Paced: 0 %
HighPow Impedance: 65 Ohm
Implantable Lead Implant Date: 20050603
Implantable Lead Location: 753860
Implantable Lead Model: 185
Implantable Lead Serial Number: 116340
Implantable Pulse Generator Implant Date: 20110505
Lead Channel Impedance Value: 653 Ohm
Lead Channel Pacing Threshold Pulse Width: 0.4 ms
Lead Channel Setting Sensing Sensitivity: 0.4 mV
MDC IDC MSMT LEADCHNL RV PACING THRESHOLD AMPLITUDE: 0.8 V
MDC IDC SESS DTM: 20181017070100
MDC IDC SET LEADCHNL RV PACING AMPLITUDE: 2.4 V
MDC IDC SET LEADCHNL RV PACING PULSEWIDTH: 0.4 ms
Pulse Gen Serial Number: 266301

## 2017-07-08 ENCOUNTER — Encounter: Payer: Self-pay | Admitting: Internal Medicine

## 2017-07-08 ENCOUNTER — Ambulatory Visit (INDEPENDENT_AMBULATORY_CARE_PROVIDER_SITE_OTHER): Payer: Medicare Other | Admitting: Internal Medicine

## 2017-07-08 VITALS — BP 120/76 | HR 87 | Temp 99.6°F | Wt 221.0 lb

## 2017-07-08 DIAGNOSIS — J0100 Acute maxillary sinusitis, unspecified: Secondary | ICD-10-CM | POA: Diagnosis not present

## 2017-07-08 MED ORDER — DOXYCYCLINE HYCLATE 100 MG PO TABS: 100.0000 mg | ORAL_TABLET | Freq: Two times a day (BID) | ORAL | 0 refills | Status: DC

## 2017-07-08 NOTE — Patient Instructions (Signed)

## 2017-07-08 NOTE — Progress Notes (Signed)
Office Visit Note  Patient: [name]             Date of Birth: 12/15/1950           MRN: [id number]             PCP: Abner Greenspan, MD Referring: Tower, [name], MD Visit Date: 07/17/2017 Occupation: @GUAROCC @    Subjective:  Right hip pain   History of Present Illness: [name] is a 66 y.o. female with a history of fibromyalgia and osteoarthritis of knees and hands.  Patient states she still has not had her right knee replaced yet, and she hopes to get it done in January/February 2019.  She continues to have pain and mild swelling of her right knee.  Her left knee was previously replaced, and she reports it is pain free.  She states her right hip started to become painful about 1 month ago.  She denies any injury.  She attributes her hip pain to walking differently due to her right knee pain.  She states her fibromyalgia has been well controlled.  She continues to take Baclofen at bedtime. She continues to have insomnia and takes Ambien nightly.  She reports her fatigue has improved significantly since starting to use her CPAP machine.  She has been unable to use her CPAP for the past 2 weeks due to a sinus infection.  She is currently taking Doxycycline.         She continues to have bilateral CMC pain.  She wears her bilateral CMC braces as needed.    Patient requested Tramadol for her right hip and right knee pain.     Activities of Daily Living:  Patient reports morning stiffness for 10  minutes.   Patient Reports nocturnal pain.  Difficulty dressing/grooming: Denies Difficulty climbing stairs: Reports Difficulty getting out of chair: Reports Difficulty using hands for taps, buttons, cutlery, and/or writing: Reports   Review of Systems  Constitutional: Negative for fatigue, night sweats, weight gain, weight loss and weakness.  HENT: Positive for mouth sores. Negative for trouble swallowing, trouble swallowing, mouth dryness and nose dryness.   Eyes: Negative for  pain, redness, visual disturbance and dryness.  Respiratory: Positive for shortness of breath (Followed by pulmonologist). Negative for cough and difficulty breathing.   Cardiovascular: Positive for palpitations. Negative for chest pain, hypertension, irregular heartbeat and swelling in legs/feet.  Gastrointestinal: Negative for blood in stool, constipation and diarrhea.  Endocrine: Negative for increased urination.  Genitourinary: Negative for vaginal dryness.  Musculoskeletal: Positive for arthralgias, joint pain and morning stiffness. Negative for joint swelling, myalgias, muscle weakness, muscle tenderness and myalgias.  Skin: Negative for color change, rash, hair loss, skin tightness, ulcers and sensitivity to sunlight.  Allergic/Immunologic: Negative for susceptible to infections.  Neurological: Negative for dizziness, memory loss and night sweats.  Hematological: Negative for swollen glands.  Psychiatric/Behavioral: Positive for depressed mood and sleep disturbance. The patient is not nervous/anxious.     PMFS History:  Patient Active Problem List   Diagnosis Date Noted  . Acute sinusitis 05/17/2017  . Paresthesia of foot, bilateral 04/12/2017  . Itching 02/22/2017  . Primary osteoarthritis of both hands 09/27/2016  . Primary osteoarthritis of both knees 09/27/2016  . Urinary frequency 09/26/2016  . Screen for STD (sexually transmitted disease) 09/26/2016  . Nasopharyngitis, 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/2016  . Postnasal drip 05/18/2016  . S/P total knee replacement 02/13/2016  . Chronic pain of both knees 12/06/2015  . Globus pharyngeus 05/17/2015  .  Productive cough 05/17/2015  . Genetic testing 12/23/2014  . Family history of breast cancer   . Family history of colon cancer   . Family history of pancreatic cancer   . Breast cancer of upper-outer quadrant of right female breast ([place]) 11/23/2014  . Allergy to ACE inhibitors 09/27/2014  . Encounter for Medicare annual wellness exam  06/27/2014  . Hyperglycemia 06/23/2013  . Routine general medical examination at a health care facility 06/23/2013  . Dizziness and giddiness 01/16/2013  . Disturbance of skin sensation 01/16/2013  . RLS (restless legs syndrome) 01/15/2013  . Hx of Clostridium difficile infection 10/10/2012  . Bronchiectasis without acute exacerbation ([place]) 04/29/2012  . Dyspnea on exertion 04/02/2012  . Non-ischemic cardiomyopathy ([place]) 03/07/2012  . Colon cancer screening 01/11/2012  . Post-menopausal 01/11/2012  . Chest pain 11/07/2011  . Palpitations 04/17/2011  . Abnormal tympanic membrane 01/22/2011  . Obstructive sleep apnea 04/04/2010  . VENTRICULAR TACHYCARDIA 01/03/2009  . ICD  [employer]  Single chamber 01/03/2009  . PATENT FORAMEN OVALE 09/29/2008  . Vitamin D deficiency 08/17/2008  . Hypothyroidism 11/18/2006  . Hyperlipidemia 11/18/2006  . DEPRESSION 11/18/2006  . Essential hypertension 11/18/2006  . MITRAL VALVE PROLAPSE 11/18/2006  . CEREBRAL ANEURYSM 11/18/2006  . Allergic rhinitis 11/18/2006  . GERD 11/18/2006  . DIVERTICULOSIS, COLON 11/18/2006  . Fatty liver 11/18/2006  . Fibromyalgia 11/18/2006    Past Medical History:  Diagnosis Date  . AICD (automatic cardioverter/defibrillator) present   . Anxiety   . Arthritis   . 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 Cerebral aneurysm, nonruptured   . Clostridium difficile infection   . Depressive disorder, not elsewhere classified   . Diverticulosis of colon (without mention of hemorrhage)   . Esophageal reflux   . Fibromyalgia   . Gastritis   . Gastritis   . GERD (gastroesophageal reflux disease)   . GI bleed   . Hiatal hernia   . Hyperlipidemia   . Hypertension   . Hypothyroidism   . Internal hemorrhoids   . Obstructive sleep apnea (adult) (pediatric)   . Ostium secundum type atrial septal defect   . Other chronic nonalcoholic liver disease   . Other pulmonary embolism and infarction   . Paroxysmal  ventricular tachycardia ([place])   . Personal history of radiation therapy   . Pneumonia    history  . Presence of permanent cardiac pacemaker   . PUD (peptic ulcer disease)   . Radiation 02/03/15-03/10/15   Right Breast  . Sarcoid   . Schatzki's ring   . Stroke [facility]) 2013   tia  . Takotsubo syndrome   . Tubular adenoma of colon   . Unspecified transient cerebral ischemia   . Unspecified vitamin D deficiency     Family History  Problem Relation Age of Onset  . Diabetes Mother   . Alzheimer's disease Mother   . Other Brother        Thyroid problem 10/2016  . Allergies Sister   . Heart disease Sister   . Heart disease Brother   . Prostate cancer Brother 76       same brother as throat cancer  . Throat cancer Brother        dx in his 88s; also a smoker  . Heart disease Father   . Cancer Maternal Grandmother 24       colon cancer or abdominal cancer  . Lung cancer Maternal Grandfather 56  . Breast cancer Maternal Aunt 69  .  Colon cancer Maternal Aunt 11       same sister as breast at 24  . Lung cancer Maternal Uncle   . Breast cancer Maternal Aunt        dx in her 90s  . Pancreatic cancer Cousin 57       maternal first cousin  . Breast cancer Cousin        paternal first cousin twice removed died in her 89s  . Cardiomyopathy Unknown        Family history  . Heart disease Son        Cardiac Arrest 07/2016  . Stroke Neg Hx    Past Surgical History:  Procedure Laterality Date  . ABDOMINAL HYSTERECTOMY    . ABI  2006   normal  . BREAST EXCISIONAL BIOPSY    . BREAST LUMPECTOMY     right breast 2016  . BREAST LUMPECTOMY WITH RADIOACTIVE SEED LOCALIZATION Right 01/03/2015   Procedure: BREAST LUMPECTOMY WITH RADIOACTIVE SEED LOCALIZATION;  Surgeon: Autumn Messing III, MD;  Location: [facility];  Service: General;  Laterality: Right;  . CARDIAC DEFIBRILLATOR PLACEMENT  2006; 2012   BSX single chamber ICD  . carotid dopplers  2006   neg  . CEREBRAL ANEURYSM REPAIR  02/1999  .  hospitalization  2004   GI bleed, PUD, diverticulosis (EGD,colonscopy)  . hospitalization     PE, NSVT, s/p defib  . JOINT REPLACEMENT  2017   left knee   . KNEE ARTHROSCOPY     bilateral  . LEFT HEART CATHETERIZATION WITH CORONARY ANGIOGRAM N/A 02/22/2012   Procedure: LEFT HEART CATHETERIZATION WITH CORONARY ANGIOGRAM;  Surgeon: Burnell Blanks, MD;  Location: [facility] CATH LAB;  Service: Cardiovascular;  Laterality: N/A;  . PARTIAL HYSTERECTOMY     Fibroids  . TOTAL KNEE ARTHROPLASTY Left 02/13/2016   Procedure: TOTAL KNEE ARTHROPLASTY;  Surgeon: Vickey Huger, MD;  Location: [facility];  Service: Orthopedics;  Laterality: Left;   Social History   Social History Narrative   Lives alone   Caffeine use: rare   Retired from [employer]   2 children   Divorced   Right handed      Objective: Vital Signs: BP 126/72 (BP Location: Left Arm, Patient Position: Sitting, Cuff Size: Normal)   Pulse 76   Resp 18   Ht 5' 6.5" (1.689 m)   Wt 224 lb (101.6 kg)   LMP  (LMP Unknown)   BMI 35.61 kg/m    Physical Exam  Constitutional: She is oriented to person, place, and time. She appears well-developed and well-nourished.  HENT:  Head: Normocephalic and atraumatic.  Eyes: Conjunctivae and EOM are normal.  Neck: Normal range of motion.  Cardiovascular: Normal rate, regular rhythm, normal heart sounds and intact distal pulses.  Pulmonary/Chest: Effort normal and breath sounds normal.  Abdominal: Soft. Bowel sounds are normal.  Lymphadenopathy:    She has no cervical adenopathy.  Neurological: She is alert and oriented to person, place, and time.  Skin: Skin is warm and dry. Capillary refill takes less than 2 seconds.  Psychiatric: She has a normal mood and affect. Her behavior is normal.  Nursing note and vitals reviewed.    Musculoskeletal Exam: C-spine limited ROM with discomfort.   Thoracica nd lumbar spine good ROM. Shoulder joints, elbow joints, and wrist joints good ROM with no  synovitis.  MCPs, PIPs, and DIPs good ROM with no synovitis.  Mild DIP synovial thickening bilaterally.  Right hip good ROM but discomfort with  internal rotation.  Left hip good ROM.  Left knee status post arthroplasty has warmth, no synovitis. Right knee limited extension but no synovitis.  MTPs, PIPs, and DIPs good ROM with no synovitis.  Bilateral plantar fasciitis.     CDAI Exam: No CDAI exam completed.    Investigation: No additional findings. CBC Latest Ref Rng & Units 05/30/2017 02/27/2017 11/26/2016  WBC 4.0 - 10.5 K/uL 4.2 4.5 4.0  Hemoglobin 12.0 - 15.0 g/dL 13.5 13.2 13.0  Hematocrit 36.0 - 46.0 % 41.8 40.4 39.6  Platelets 150.0 - 400.0 K/uL 221.0 224 213.0   CMP Latest Ref Rng & Units 02/27/2017 02/22/2017 11/26/2016  Glucose 65 - 99 mg/dL 91 122(H) 105(H)  BUN 7 - 25 mg/dL 14 13 10   Creatinine 0.50 - 0.99 mg/dL 0.82 0.89 0.84  Sodium 135 - 146 mmol/L 139 141 140  Potassium 3.5 - 5.3 mmol/L 4.1 3.8 3.9  Chloride 98 - 110 mmol/L 103 104 105  CO2 20 - 31 mmol/L 27 32 31  Calcium 8.6 - 10.4 mg/dL 10.3 10.0 9.9  Total Protein 6.1 - 8.1 g/dL 7.6 7.5 7.4  Total Bilirubin 0.2 - 1.2 mg/dL 0.5 0.4 0.4  Alkaline Phos 33 - 130 U/L 120 107 112  AST 10 - 35 U/L 36(H) 33 25  ALT 6 - 29 U/L 39(H) 35 27    Imaging: No results found.  Speciality Comments: No specialty comments available.     Procedures:  No procedures performed   Allergies: Ace inhibitors; Atorvastatin; Ramipril; Rosuvastatin; Amitriptyline hcl; Amitriptyline hcl; Iodine-131; Paroxetine; Carbamazepine; Cetirizine hcl; Cetirizine hcl; Codeine; Dilantin [phenytoin sodium extended]; and Phenytoin sodium extended   Assessment / Plan:     Visit Diagnoses: Fibromyalgia: Patient is clinically stable.  She denies any myalgias at this time.  Her insomnia continues but she is taking Ambien nightly.  She also feels her fatigue has improved since starting to use CPAP.    Primary osteoarthritis of both knees: Right knee has  limited extension and continues to cause significant discomfort for the patient.  She hopes to have a total right knee arthroplasty in January or February 2019.  Per patient her left knee is painless after having in replaced.   Patient requested a prescription of Tramadol 50 mg, 1 tablet PRN at bedtime.  Her last UDS was 11/30/16.  Her narcotic agreement was updated today-07/17/17.    Pain in right hip: Patient was given a prescription for Tramadol 50 mg for pain relief.  Discussed hip exercises.  Patient is going to discuss her hip pain with her orthopedic surgeon when she sees him about her right knee pain.   Primary osteoarthritis of both hands: No active synovitis.  Patient continues to have bilateral CMC pain and wears her [place] braces regularly.    Status post total knee replacement, left: Patient states she no longer experiences any pain and has great ROM.   History of hypertension: Controlled in the office today.    Other medical issues are listed as follows:   History of gastroesophageal reflux (GERD)  Brain aneurysm  History of breast cancer  History of hypercholesterolemia  History of TIA (transient ischemic attack)  History of migraine  Cardiac arrhythmia, unspecified cardiac arrhythmia type - S/P pacer  Hx of Clostridium difficile infection  RLS (restless legs syndrome)  History of diverticulosis  History of sleep apnea: uses CPAP   History of depression  History of hypothyroidism  History of vitamin D deficiency: Takes OTC supplements  Orders: No orders of the defined types were placed in this encounter.  Meds ordered this encounter  Medications  . traMADol (ULTRAM) 50 MG tablet    Sig: Take 1 tablet (50 mg total) by mouth at bedtime.    Dispense:  30 tablet    Refill:  1    Order Specific Question:   Supervising Provider    Answer:   Lyda Perone     Follow-Up Instructions: Return in about 6 months (around 01/14/2018) for Osteoarthritis  and fibromyalgia .   Bo Merino, MD  Note - This record has been created using Editor, commissioning.  Chart creation errors have been sought, but may not always  have been located. Such creation errors do not reflect on  the standard of medical care.

## 2017-07-08 NOTE — Progress Notes (Signed)
HPI  Pt presents to the clinic today with c/o headache, facial pain and pressure, nasal congestion, sore throat and cough. She reports this started 5 days ago. She is blowing blood tinged yellow/green mucous out of her nose. She denies difficulty swallowing. The cough is non productive. She denies fever, chills or body aches. She has tried salt water gargles, Singulair, Mucinex and OTC cough syrup with some relief. She has a history of sinus infections and reports this feels the same. She has not had sick contacts that she is aware of.   Review of Systems     Past Medical History:  Diagnosis Date  . AICD (automatic cardioverter/defibrillator) present   . Anxiety   . Arthritis   . 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 Cerebral aneurysm, nonruptured   . Clostridium difficile infection   . Depressive disorder, not elsewhere classified   . Diverticulosis of colon (without mention of hemorrhage)   . Esophageal reflux   . Fibromyalgia   . Gastritis   . Gastritis   . GERD (gastroesophageal reflux disease)   . GI bleed   . Hiatal hernia   . Hyperlipidemia   . Hypertension   . Hypothyroidism   . Internal hemorrhoids   . Obstructive sleep apnea (adult) (pediatric)   . Ostium secundum type atrial septal defect   . Other chronic nonalcoholic liver disease   . Other pulmonary embolism and infarction   . Paroxysmal ventricular tachycardia ([place])   . Personal history of radiation therapy   . Pneumonia    history  . Presence of permanent cardiac pacemaker   . PUD (peptic ulcer disease)   . Radiation 02/03/15-03/10/15   Right Breast  . Sarcoid   . Schatzki's ring   . Stroke [facility]) 2013   tia  . Takotsubo syndrome   . Tubular adenoma of colon   . Unspecified transient cerebral ischemia   . Unspecified vitamin D deficiency     Family History  Problem Relation Age of Onset  . Diabetes Mother   . Alzheimer's disease Mother   . Other Brother        Thyroid problem 10/2016  .  Allergies Sister   . Heart disease Sister   . Heart disease Brother   . Prostate cancer Brother 53       same brother as throat cancer  . Throat cancer Brother        dx in his 66s; also a smoker  . Heart disease Father   . Cancer Maternal Grandmother 22       colon cancer or abdominal cancer  . Lung cancer Maternal Grandfather 54  . Breast cancer Maternal Aunt 63  . Colon cancer Maternal Aunt 35       same sister as breast at 71  . Lung cancer Maternal Uncle   . Breast cancer Maternal Aunt        dx in her 74s  . Pancreatic cancer Cousin 62       maternal first cousin  . Breast cancer Cousin        paternal first cousin twice removed died in her 88s  . Cardiomyopathy Unknown        Family history  . Heart attack Sister   . Hypertension Sister   . Hypertension Brother   . Heart disease Son        Cardiac Arrest 07/2016  . Stroke Neg Hx     Social History   Socioeconomic  History  . Marital status: Divorced    Spouse name: Not on file  . Number of children: 2  . Years of education: Not on file  . Highest education level: Not on file  Social Needs  . Financial resource strain: Not on file  . Food insecurity - worry: Not on file  . Food insecurity - inability: Not on file  . Transportation needs - medical: Not on file  . Transportation needs - non-medical: Not on file  Occupational History  . Occupation: DISABLED  Tobacco Use  . Smoking status: Former Smoker    Packs/day: 1.00    Years: 15.00    Pack years: 15.00    Types: Cigarettes    Last attempt to quit: 08/20/1989    Years since quitting: 27.9  . Smokeless tobacco: Never Used  . Tobacco comment: Started at 62; less than 1 PPD  Substance and Sexual Activity  . Alcohol use: Yes    Alcohol/week: 0.0 oz    Comment: rare  . Drug use: No  . Sexual activity: No  Other Topics Concern  . Not on file  Social History Narrative   Lives alone   Caffeine use: rare   Retired from [employer]   2 children    Divorced   Right handed     Allergies  Allergen Reactions  . Ace Inhibitors Swelling    Angioedema; makes tongue "break out" Angioedema; makes tongue "break out"  . Atorvastatin Other (See Comments)    SEVERE MYALGIAS, MUSCLE PAIN SEVERE MYALGIAS, MUSCLE PAIN  . Ramipril Other (See Comments)    TONGUE ULCERS TONGUE ULCERS  . Rosuvastatin Other (See Comments)    MYALGIAS, SEVERE MUSCLE PAIN MYALGIAS, SEVERE MUSCLE PAIN  . Amitriptyline Hcl Other (See Comments)    Unknown  . Amitriptyline Hcl Other (See Comments)    Unknown  . Iodine-131 Other (See Comments)  . Paroxetine Other (See Comments)    UNKNOWN UNKNOWN  . Carbamazepine Rash    REACTION: ? reaction REACTION: ? reaction  . Cetirizine Hcl Other (See Comments)    REACTION: sleepy  . Cetirizine Hcl Other (See Comments)    REACTION: sleepy  . Codeine Itching  . Dilantin [Phenytoin Sodium Extended] Rash  . Phenytoin Sodium Extended Rash     Constitutional: Positive headache. Denies fatigue, fever or abrupt weight changes.  HEENT:  Positive facial pressure, nasal congestion and sore throat. Denies eye redness, ear pain, ringing in the ears, wax buildup, runny nose or bloody nose. Respiratory: Positive cough. Denies difficulty breathing or shortness of breath.  Cardiovascular: Denies chest pain, chest tightness, palpitations or swelling in the hands or feet.   No other specific complaints in a complete review of systems (except as listed in HPI above).  Objective:   BP 120/76   Pulse 87   Temp 99.6 F (37.6 C) (Oral)   Wt 221 lb (100.2 kg)   LMP  (LMP Unknown)   SpO2 98%   BMI 35.67 kg/m   General: Appears her stated age, ill appearing, in NAD. HEENT: Head: normal shape and size, maxillary sinus tenderness noted;  Ears: Tm's gray and intact, normal light reflex; Nose: mucosa boggy and moist, turbinates swollen; Throat/Mouth: + PND. Teeth present, mucosa erythematous and moist, no exudate noted, no lesions or  ulcerations noted.  Neck:  No adenopathy noted.  Pulmonary/Chest: Normal effort and positive vesicular breath sounds. No respiratory distress. No wheezes, rales or ronchi noted.       Assessment &  Plan:   Acute  Maxillary Sinusitis  Can use a Neti Pot which can be purchased from your local drug store. Flonase 2 sprays each nostril for 3 days and then as needed. eRx for Doxycycline BID for 10 days  RTC as needed or if symptoms persist. Webb Silversmith, NP

## 2017-07-17 ENCOUNTER — Encounter: Payer: Self-pay | Admitting: Rheumatology

## 2017-07-17 ENCOUNTER — Ambulatory Visit (INDEPENDENT_AMBULATORY_CARE_PROVIDER_SITE_OTHER): Payer: Medicare Other | Admitting: Rheumatology

## 2017-07-17 VITALS — BP 126/72 | HR 76 | Resp 18 | Ht 66.5 in | Wt 224.0 lb

## 2017-07-17 DIAGNOSIS — Z8639 Personal history of other endocrine, nutritional and metabolic disease: Secondary | ICD-10-CM | POA: Diagnosis not present

## 2017-07-17 DIAGNOSIS — Z853 Personal history of malignant neoplasm of breast: Secondary | ICD-10-CM

## 2017-07-17 DIAGNOSIS — Z8679 Personal history of other diseases of the circulatory system: Secondary | ICD-10-CM | POA: Diagnosis not present

## 2017-07-17 DIAGNOSIS — Z8673 Personal history of transient ischemic attack (TIA), and cerebral infarction without residual deficits: Secondary | ICD-10-CM | POA: Diagnosis not present

## 2017-07-17 DIAGNOSIS — M170 Bilateral primary osteoarthritis of knee: Secondary | ICD-10-CM | POA: Diagnosis not present

## 2017-07-17 DIAGNOSIS — M19041 Primary osteoarthritis, right hand: Secondary | ICD-10-CM | POA: Diagnosis not present

## 2017-07-17 DIAGNOSIS — I499 Cardiac arrhythmia, unspecified: Secondary | ICD-10-CM | POA: Diagnosis not present

## 2017-07-17 DIAGNOSIS — I671 Cerebral aneurysm, nonruptured: Secondary | ICD-10-CM | POA: Diagnosis not present

## 2017-07-17 DIAGNOSIS — Z8669 Personal history of other diseases of the nervous system and sense organs: Secondary | ICD-10-CM

## 2017-07-17 DIAGNOSIS — M25551 Pain in right hip: Secondary | ICD-10-CM

## 2017-07-17 DIAGNOSIS — M19042 Primary osteoarthritis, left hand: Secondary | ICD-10-CM

## 2017-07-17 DIAGNOSIS — Z96659 Presence of unspecified artificial knee joint: Secondary | ICD-10-CM

## 2017-07-17 DIAGNOSIS — G2581 Restless legs syndrome: Secondary | ICD-10-CM

## 2017-07-17 DIAGNOSIS — M797 Fibromyalgia: Secondary | ICD-10-CM | POA: Diagnosis not present

## 2017-07-17 DIAGNOSIS — Z8719 Personal history of other diseases of the digestive system: Secondary | ICD-10-CM | POA: Diagnosis not present

## 2017-07-17 DIAGNOSIS — Z8659 Personal history of other mental and behavioral disorders: Secondary | ICD-10-CM

## 2017-07-17 DIAGNOSIS — Z8619 Personal history of other infectious and parasitic diseases: Secondary | ICD-10-CM

## 2017-07-17 MED ORDER — TRAMADOL HCL 50 MG PO TABS: 50.0000 mg | ORAL_TABLET | Freq: Every day | ORAL | 1 refills | Status: DC

## 2017-07-17 NOTE — Patient Instructions (Signed)
Hip Exercises Ask your health care provider which exercises are safe for you. Do exercises exactly as told by your health care provider and adjust them as directed. It is normal to feel mild stretching, pulling, tightness, or discomfort as you do these exercises, but you should stop right away if you feel sudden pain or your pain gets worse.Do not begin these exercises until told by your health care provider. STRETCHING AND RANGE OF MOTION EXERCISES These exercises warm up your muscles and joints and improve the movement and flexibility of your hip. These exercises also help to relieve pain, numbness, and tingling. Exercise A: Hamstrings, Supine  1. Lie on your back. 2. Loop a belt or towel over the ball of your left / rightfoot. The ball of your foot is on the walking surface, right under your toes. 3. Straighten your left / rightknee and slowly pull on the belt to raise your leg. ? Do not let your left / right knee bend while you do this. ? Keep your other leg flat on the floor. ? Raise the left / right leg until you feel a gentle stretch behind your left / right knee or thigh. 4. Hold this position for __________ seconds. 5. Slowly return your leg to the starting position. Repeat __________ times. Complete this stretch __________ times a day. Exercise B: Hip Rotators  1. Lie on your back on a firm surface. 2. Hold your left / right knee with your left / right hand. Hold your ankle with your other hand. 3. Gently pull your left / right knee and rotate your lower leg toward your other shoulder. ? Pull until you feel a stretch in your buttocks. ? Keep your hips and shoulders firmly planted while you do this stretch. 4. Hold this position for __________ seconds. Repeat __________ times. Complete this stretch __________ times a day. Exercise C: V-Sit (Hamstrings and Adductors)  1. Sit on the floor with your legs extended in a large "V" shape. Keep your knees straight during this  exercise. 2. Start with your head and chest upright, then bend at your waist to reach for your left foot (position A). You should feel a stretch in your right inner thigh. 3. Hold this position for __________ seconds. Then slowly return to the upright position. 4. Bend at your waist to reach forward (position B). You should feel a stretch behind both of your thighs and knees. 5. Hold this position for __________ seconds. Then slowly return to the upright position. 6. Bend at your waist to reach for your right foot (position C). You should feel a stretch in your left inner thigh. 7. Hold this position for __________ seconds. Then slowly return to the upright position. Repeat __________ times. Complete this stretch __________ times a day. Exercise D: Lunge (Hip Flexors)  1. Place your left / right knee on the floor and bend your other knee so that is directly over your ankle. You should be half-kneeling. 2. Keep good posture with your head over your shoulders. 3. Tighten your buttocks to point your tailbone downward. This helps your back to keep from arching too much. 4. You should feel a gentle stretch in the front of your left / right thigh and hip. If you do not feel any resistance, slightly slide your other foot forward and then slowly lunge forward so your knee once again lines up over your ankle. 5. Make sure your tailbone continues to point downward. 6. Hold this position for __________ seconds. Repeat   __________ times. Complete this stretch __________ times a day. STRENGTHENING EXERCISES These exercises build strength and endurance in your hip. Endurance is the ability to use your muscles for a long time, even after they get tired. Exercise E: Bridge (Hip Extensors)  1. Lie on your back on a firm surface with your knees bent and your feet flat on the floor. 2. Tighten your buttocks muscles and lift your bottom off the floor until the trunk of your body is level with your thighs. ? Do not  arch your back. ? You should feel the muscles working in your buttocks and the back of your thighs. If you do not feel these muscles, slide your feet 1-2 inches (2.5-5 cm) farther away from your buttocks. 3. Hold this position for __________ seconds. 4. Slowly lower your hips to the starting position. 5. Let your muscles relax completely between repetitions. 6. If this exercise is too easy, try doing it with your arms crossed over your chest. Repeat __________ times. Complete this exercise __________ times a day. Exercise F: Straight Leg Raises - Hip Abductors  1. Lie on your side with your left / right leg in the top position. Lie so your head, shoulder, knee, and hip line up with each other. You may bend your bottom knee to help you balance. 2. Roll your hips slightly forward, so your hips are stacked directly over each other and your left / right knee is facing forward. 3. Leading with your heel, lift your top leg 4-6 inches (10-15 cm). You should feel the muscles in your outer hip lifting. ? Do not let your foot drift forward. ? Do not let your knee roll toward the ceiling. 4. Hold this position for __________ seconds. 5. Slowly return to the starting position. 6. Let your muscles relax completely between repetitions. Repeat __________ times. Complete this exercise __________ times a day. Exercise G: Straight Leg Raises - Hip Adductors  1. Lie on your side with your left / right leg in the bottom position. Lie so your head, shoulder, knee, and hip line up. You may place your upper foot in front to help you balance. 2. Roll your hips slightly forward, so your hips are stacked directly over each other and your left / right knee is facing forward. 3. Tense the muscles in your inner thigh and lift your bottom leg 4-6 inches (10-15 cm). 4. Hold this position for __________ seconds. 5. Slowly return to the starting position. 6. Let your muscles relax completely between repetitions. Repeat  __________ times. Complete this exercise __________ times a day. Exercise H: Straight Leg Raises - Quadriceps  1. Lie on your back with your left / right leg extended and your other knee bent. 2. Tense the muscles in the front of your left / right thigh. When you do this, you should see your kneecap slide up or see increased dimpling just above your knee. 3. Tighten these muscles even more and raise your leg 4-6 inches (10-15 cm) off the floor. 4. Hold this position for __________ seconds. 5. Keep these muscles tense as you lower your leg. 6. Relax the muscles slowly and completely between repetitions. Repeat __________ times. Complete this exercise __________ times a day. Exercise I: Hip Abductors, Standing 1. Tie one end of a rubber exercise band or tubing to a secure surface, such as a table or pole. 2. Loop the other end of the band or tubing around your left / right ankle. 3. Keeping your ankle with   the band or tubing directly opposite of the secured end, step away until there is tension in the tubing or band. Hold onto a chair as needed for balance. 4. Lift your left / right leg out to your side. While you do this: ? Keep your back upright. ? Keep your shoulders over your hips. ? Keep your toes pointing forward. ? Make sure to use your hip muscles to lift your leg. Do not "throw" your leg or tip your body to lift your leg. 5. Hold this position for __________ seconds. 6. Slowly return to the starting position. Repeat __________ times. Complete this exercise __________ times a day. Exercise J: Squats (Quadriceps) 1. Stand in a door frame so your feet and knees are in line with the frame. You may place your hands on the frame for balance. 2. Slowly bend your knees and lower your hips like you are going to sit in a chair. ? Keep your lower legs in a straight-up-and-down position. ? Do not let your hips go lower than your knees. ? Do not bend your knees lower than told by your health  care provider. ? If your hip pain increases, do not bend as low. 3. Hold this position for ___________ seconds. 4. Slowly push with your legs to return to standing. Do not use your hands to pull yourself to standing. Repeat __________ times. Complete this exercise __________ times a day. This information is not intended to replace advice given to you by your health care provider. Make sure you discuss any questions you have with your health care provider. Document Released: 08/24/2005 Document Revised: 04/30/2016 Document Reviewed: 08/01/2015 Elsevier Interactive Patient Education  2018 [employer] Inc.  

## 2017-07-19 DIAGNOSIS — J3489 Other specified disorders of nose and nasal sinuses: Secondary | ICD-10-CM | POA: Insufficient documentation

## 2017-08-01 ENCOUNTER — Ambulatory Visit: Payer: Medicare Other | Admitting: Rheumatology

## 2017-08-06 ENCOUNTER — Encounter: Payer: Self-pay | Admitting: Internal Medicine

## 2017-08-06 ENCOUNTER — Ambulatory Visit (INDEPENDENT_AMBULATORY_CARE_PROVIDER_SITE_OTHER): Payer: Medicare Other | Admitting: Internal Medicine

## 2017-08-06 VITALS — BP 124/80 | HR 60 | Temp 97.9°F | Wt 224.0 lb

## 2017-08-06 DIAGNOSIS — J3089 Other allergic rhinitis: Secondary | ICD-10-CM | POA: Diagnosis not present

## 2017-08-06 MED ORDER — PREDNISONE 10 MG PO TABS: ORAL_TABLET | ORAL | 0 refills | Status: DC

## 2017-08-06 NOTE — Patient Instructions (Signed)
Allergic Rhinitis Allergic rhinitis is when the mucous membranes in the nose respond to allergens. Allergens are particles in the air that cause your body to have an allergic reaction. This causes you to release allergic antibodies. Through a chain of events, these eventually cause you to release histamine into the blood stream. Although meant to protect the body, it is this release of histamine that causes your discomfort, such as frequent sneezing, congestion, and an itchy, runny nose. What are the causes? Seasonal allergic rhinitis (hay fever) is caused by pollen allergens that may come from grasses, trees, and weeds. Year-round allergic rhinitis (perennial allergic rhinitis) is caused by allergens such as house dust mites, pet dander, and mold spores. What are the signs or symptoms?  Nasal stuffiness (congestion).  Itchy, runny nose with sneezing and tearing of the eyes. How is this diagnosed? Your health care provider can help you determine the allergen or allergens that trigger your symptoms. If you and your health care provider are unable to determine the allergen, skin or blood testing may be used. Your health care provider will diagnose your condition after taking your health history and performing a physical exam. Your health care provider may assess you for other related conditions, such as asthma, pink eye, or an ear infection. How is this treated? Allergic rhinitis does not have a cure, but it can be controlled by:  Medicines that block allergy symptoms. These may include allergy shots, nasal sprays, and oral antihistamines.  Avoiding the allergen. Hay fever may often be treated with antihistamines in pill or nasal spray forms. Antihistamines block the effects of histamine. There are over-the-counter medicines that may help with nasal congestion and swelling around the eyes. Check with your health care provider before taking or giving this medicine. If avoiding the allergen or the  medicine prescribed do not work, there are many new medicines your health care provider can prescribe. Stronger medicine may be used if initial measures are ineffective. Desensitizing injections can be used if medicine and avoidance does not work. Desensitization is when a patient is given ongoing shots until the body becomes less sensitive to the allergen. Make sure you follow up with your health care provider if problems continue. Follow these instructions at home: It is not possible to completely avoid allergens, but you can reduce your symptoms by taking steps to limit your exposure to them. It helps to know exactly what you are allergic to so that you can avoid your specific triggers. Contact a health care provider if:  You have a fever.  You develop a cough that does not stop easily (persistent).  You have shortness of breath.  You start wheezing.  Symptoms interfere with normal daily activities. This information is not intended to replace advice given to you by your health care provider. Make sure you discuss any questions you have with your health care provider. Document Released: 05/01/2001 Document Revised: 04/06/2016 Document Reviewed: 04/13/2013 Elsevier Interactive Patient Education  2017 Elsevier Inc.  

## 2017-08-06 NOTE — Progress Notes (Signed)
Subjective:    Patient ID: [name], female    DOB: 1950-12-17, 66 y.o.   MRN: [id number]  HPI  Pt presents to the clinic today with c/o nasal congestion, sore throat and cough. This started 3-4 days ago. She is blowing blood tinged, green mucous out of her nose. The sore throat is worse on the left, the pain radiates into the left ear. She denies difficulty swallowing. The cough is nonproductive. She denies fever, chills or body aches. She has tried Human resources officer, Singulair, Mucinex, nasal Saline and Flonase with some relief. She was treated for sinus infections 04/2017 and 06/2017 with Doxycycline. She has also seen her ENT, but she reports they did not change anything. She has not had sick contacts that she is aware of.  Review of Systems      Past Medical History:  Diagnosis Date  . AICD (automatic cardioverter/defibrillator) present   . Anxiety   . Arthritis   . 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 Cerebral aneurysm, nonruptured   . Clostridium difficile infection   . Depressive disorder, not elsewhere classified   . Diverticulosis of colon (without mention of hemorrhage)   . Esophageal reflux   . Fibromyalgia   . Gastritis   . Gastritis   . GERD (gastroesophageal reflux disease)   . GI bleed   . Hiatal hernia   . Hyperlipidemia   . Hypertension   . Hypothyroidism   . Internal hemorrhoids   . Obstructive sleep apnea (adult) (pediatric)   . Ostium secundum type atrial septal defect   . Other chronic nonalcoholic liver disease   . Other pulmonary embolism and infarction   . Paroxysmal ventricular tachycardia ([place])   . Personal history of radiation therapy   . Pneumonia    history  . Presence of permanent cardiac pacemaker   . PUD (peptic ulcer disease)   . Radiation 02/03/15-03/10/15   Right Breast  . Sarcoid   . Schatzki's ring   . Stroke [facility]) 2013   tia  . Takotsubo syndrome   . Tubular adenoma of colon   . Unspecified transient cerebral ischemia   .  Unspecified vitamin D deficiency     Current Outpatient Medications  Medication Sig Dispense Refill  . acetaminophen (TYLENOL) 500 MG tablet Take 500 mg by mouth every 6 (six) hours as needed (pain).     [name] albuterol (VENTOLIN HFA) 108 (90 Base) MCG/ACT inhaler Inhale 2 puffs into the lungs every 6 (six) hours as needed for wheezing or shortness of breath. 1 Inhaler 5  . aspirin 81 MG tablet Take 81 mg by mouth daily.    . baclofen (LIORESAL) 10 MG tablet Take 1 tablet (10 mg total) by mouth 3 (three) times daily. (Patient taking differently: Take 10 mg by mouth at bedtime. ) 270 tablet 1  . calcium carbonate (OSCAL) 1500 (600 Ca) MG TABS tablet Take 600 mg of elemental calcium by mouth daily with breakfast.    . carvedilol (COREG) 12.5 MG tablet Take 1 tablet (12.5 mg total) by mouth 2 (two) times daily. 180 tablet 3  . cholecalciferol (VITAMIN D) 1000 UNITS tablet Take 5,000 Units by mouth daily.     . fexofenadine (ALLEGRA) 180 MG tablet Take 180 mg by mouth daily as needed for allergies or rhinitis.    . fluticasone (FLONASE) 50 MCG/ACT nasal spray Place 2 sprays into both nostrils daily as needed for allergies or rhinitis. 48 g 3  . furosemide (LASIX)  40 MG tablet Take 1 tablet by mouth once daily as needed for increased swelling/ shortness of breath 90 tablet 3  . latanoprost (XALATAN) 0.005 % ophthalmic solution Place 1 drop into both eyes at bedtime.    [name] levothyroxine (SYNTHROID, LEVOTHROID) 50 MCG tablet Take 1 tablet (50 mcg total) by mouth daily. 90 tablet 3  . mometasone (ELOCON) 0.1 % lotion     . montelukast (SINGULAIR) 10 MG tablet Take 1 tablet (10 mg total) by mouth at bedtime. 90 tablet 3  . Multiple Vitamins-Minerals (MULTIVITAMIN PO) Take 1 tablet by mouth daily.    . pantoprazole (PROTONIX) 40 MG tablet TAKE 1 TABLET EVERY DAY 90 tablet 1  . polyethylene glycol powder (GLYCOLAX/MIRALAX) powder Take 17 grams by mouth daily 1080 g 0  . potassium chloride (K-DUR) 10 MEQ tablet  TAKE 1/2 TABLET (5MEQ TOTAL) EVERY DAY 45 tablet 1  . simvastatin (ZOCOR) 40 MG tablet Take 0.5 tablets (20 mg total) by mouth daily. 45 tablet 3  . traMADol (ULTRAM) 50 MG tablet Take 1 tablet (50 mg total) by mouth at bedtime. 30 tablet 1  . zolpidem (AMBIEN) 10 MG tablet Take 1 tablet (10 mg total) by mouth at bedtime as needed. for sleep 90 tablet 1  . predniSONE (DELTASONE) 10 MG tablet Take 3 tabs on days 1-2, take 2 tabs on days 3-4, take 1 tab on days 5-6 12 tablet 0   No current facility-administered medications for this visit.     Allergies  Allergen Reactions  . Ace Inhibitors Swelling    Angioedema; makes tongue "break out" Angioedema; makes tongue "break out"  . Atorvastatin Other (See Comments)    SEVERE MYALGIAS, MUSCLE PAIN SEVERE MYALGIAS, MUSCLE PAIN  . Ramipril Other (See Comments)    TONGUE ULCERS TONGUE ULCERS  . Rosuvastatin Other (See Comments)    MYALGIAS, SEVERE MUSCLE PAIN MYALGIAS, SEVERE MUSCLE PAIN  . Amitriptyline Hcl Other (See Comments)    Unknown  . Amitriptyline Hcl Other (See Comments)    Unknown  . Iodine-131 Other (See Comments)  . Paroxetine Other (See Comments)    UNKNOWN UNKNOWN  . Carbamazepine Rash    REACTION: ? reaction REACTION: ? reaction  . Cetirizine Hcl Other (See Comments)    REACTION: sleepy  . Cetirizine Hcl Other (See Comments)    REACTION: sleepy  . Codeine Itching  . Dilantin [Phenytoin Sodium Extended] Rash  . Phenytoin Sodium Extended Rash    Family History  Problem Relation Age of Onset  . Diabetes Mother   . Alzheimer's disease Mother   . Other Brother        Thyroid problem 10/2016  . Allergies Sister   . Heart disease Sister   . Heart disease Brother   . Prostate cancer Brother 89       same brother as throat cancer  . Throat cancer Brother        dx in his 15s; also a smoker  . Heart disease Father   . Cancer Maternal Grandmother 77       colon cancer or abdominal cancer  . Lung cancer Maternal  Grandfather 18  . Breast cancer Maternal Aunt 42  . Colon cancer Maternal Aunt 35       same sister as breast at 38  . Lung cancer Maternal Uncle   . Breast cancer Maternal Aunt        dx in her 13s  . Pancreatic cancer Cousin 20  maternal first cousin  . Breast cancer Cousin        paternal first cousin twice removed died in her 24s  . Cardiomyopathy Unknown        Family history  . Heart disease Son        Cardiac Arrest 07/2016  . Stroke Neg Hx     Social History   Socioeconomic History  . Marital status: Divorced    Spouse name: Not on file  . Number of children: 2  . Years of education: Not on file  . Highest education level: Not on file  Social Needs  . Financial resource strain: Not on file  . Food insecurity - worry: Not on file  . Food insecurity - inability: Not on file  . Transportation needs - medical: Not on file  . Transportation needs - non-medical: Not on file  Occupational History  . Occupation: DISABLED  Tobacco Use  . Smoking status: Former Smoker    Packs/day: 1.00    Years: 15.00    Pack years: 15.00    Types: Cigarettes    Last attempt to quit: 08/20/1989    Years since quitting: 27.9  . Smokeless tobacco: Never Used  . Tobacco comment: Started at 51; less than 1 PPD  Substance and Sexual Activity  . Alcohol use: Yes    Alcohol/week: 0.0 oz    Comment: rare  . Drug use: No  . Sexual activity: No  Other Topics Concern  . Not on file  Social History Narrative   Lives alone   Caffeine use: rare   Retired from [employer]   2 children   Divorced   Right handed      Constitutional: Denies fever, malaise, fatigue, headache or abrupt weight changes.  HEENT: Pt reports nasal congestion, sore throat. Denies eye pain, eye redness, ear pain, ringing in the ears, wax buildup, runny nose, bloody nose. Respiratory: Pt reports cough. Denies difficulty breathing, shortness of breath, or sputum production.     No other specific complaints in a  complete review of systems (except as listed in HPI above).  Objective:   Physical Exam   BP 124/80   Pulse 60   Temp 97.9 F (36.6 C) (Oral)   Wt 224 lb (101.6 kg)   LMP  (LMP Unknown)   SpO2 98%   BMI 35.61 kg/m  Wt Readings from Last 3 Encounters:  08/06/17 224 lb (101.6 kg)  07/17/17 224 lb (101.6 kg)  07/08/17 221 lb (100.2 kg)    General: Appears her stated age, in NAD. HEENT: Head: normal shape and size, no sinus tenderness noted;  Ears: Tm's gray and intact, normal light reflex; Nose: mucosa pink and moist, septum midline; Throat/Mouth: Teeth present, mucosa pink and moist, no exudate, lesions or ulcerations noted.  Neck:  No adenopathy noted. Cardiovascular: Normal rate and rhythm. Pulmonary/Chest: Normal effort and positive vesicular breath sounds. No respiratory distress. No wheezes, rales or ronchi noted.   BMET    Component Value Date/Time   NA 139 02/27/2017 1518   NA 141 12/01/2014 0847   K 4.1 02/27/2017 1518   K 3.8 12/01/2014 0847   CL 103 02/27/2017 1518   CO2 27 02/27/2017 1518   CO2 26 12/01/2014 0847   GLUCOSE 91 02/27/2017 1518   GLUCOSE 175 (H) 12/01/2014 0847   BUN 14 02/27/2017 1518   BUN 10.6 12/01/2014 0847   CREATININE 0.82 02/27/2017 1518   CREATININE 0.8 12/01/2014 0847   CALCIUM 10.3  02/27/2017 1518   CALCIUM 9.8 12/01/2014 0847   GFRNONAA 74 02/27/2017 1518   GFRAA 85 02/27/2017 1518    Lipid Panel     Component Value Date/Time   CHOL 172 11/26/2016 1029   TRIG 135.0 11/26/2016 1029   HDL 48.50 11/26/2016 1029   CHOLHDL 4 11/26/2016 1029   VLDL 27.0 11/26/2016 1029   LDLCALC 96 11/26/2016 1029    CBC    Component Value Date/Time   WBC 4.2 05/30/2017 1049   RBC 4.63 05/30/2017 1049   HGB 13.5 05/30/2017 1049   HGB 12.4 12/01/2014 0845   HCT 41.8 05/30/2017 1049   HCT 38.8 12/01/2014 0845   PLT 221.0 05/30/2017 1049   PLT 213 12/01/2014 0845   MCV 90.2 05/30/2017 1049   MCV 88.5 12/01/2014 0845   MCH 29.5  02/27/2017 1518   MCHC 32.4 05/30/2017 1049   RDW 13.5 05/30/2017 1049   RDW 12.7 12/01/2014 0845   LYMPHSABS 0.9 05/30/2017 1049   LYMPHSABS 1.0 12/01/2014 0845   MONOABS 0.4 05/30/2017 1049   MONOABS 0.3 12/01/2014 0845   EOSABS 0.2 05/30/2017 1049   EOSABS 0.3 12/01/2014 0845   EOSABS 0.1 03/13/2007 1249   BASOSABS 0.0 05/30/2017 1049   BASOSABS 0.0 12/01/2014 0845    Hgb A1C Lab Results  Component Value Date   HGBA1C 5.6 04/12/2017           Assessment & Plan:   Allergic Rhinitis:  eRx for Pred Taper x 6 days Continue Singulair, Flonase and Saline Switch Allegra to Zyrtec or Xyzal No indication for abx at this time If worse, follow up with ENT- may need CT sinus vs allergy referral  Return precautions discussed Webb Silversmith, NP

## 2017-08-07 ENCOUNTER — Other Ambulatory Visit: Payer: Self-pay | Admitting: Rheumatology

## 2017-08-07 NOTE — Telephone Encounter (Signed)
Last Visit: 07/17/17 Next Visit: 01/16/18

## 2017-08-14 ENCOUNTER — Encounter: Payer: Self-pay | Admitting: Family Medicine

## 2017-08-14 ENCOUNTER — Telehealth: Payer: Self-pay

## 2017-08-14 ENCOUNTER — Ambulatory Visit (INDEPENDENT_AMBULATORY_CARE_PROVIDER_SITE_OTHER): Payer: Medicare Other | Admitting: Family Medicine

## 2017-08-14 VITALS — BP 128/66 | HR 69 | Temp 98.4°F | Wt 221.0 lb

## 2017-08-14 DIAGNOSIS — J029 Acute pharyngitis, unspecified: Secondary | ICD-10-CM | POA: Diagnosis not present

## 2017-08-14 LAB — POCT RAPID STREP A (OFFICE): RAPID STREP A SCREEN: NEGATIVE

## 2017-08-14 NOTE — Addendum Note (Signed)
Addended by: Ellamae Sia on: 08/14/2017 03:10 PM   Modules accepted: Orders

## 2017-08-14 NOTE — Progress Notes (Signed)
Subjective:    Patient ID: [name], female    DOB: September 05, 1950, 66 y.o.   MRN: [id number]  HPI This is a 66 yo female who presents today with fever and sore throat x 2 days. Started with pain on outside of neck. Fever to 101.8. Throat is feeling better today. A lot of nasal congestion and drainage. Feels a little better today. Some tylenol with improvement. No tylenol today. No difficulty swallowing liquids or solids. A little fatigued, a little achy. No known sick contacts, but was recently at a funeral.  Some cough that is mostly dry. Has been battling a sinus infection, has seen ENT. Did not take prednisone because she was traveling. Still having issues with congestion.   Past Medical History:  Diagnosis Date  . AICD (automatic cardioverter/defibrillator) present   . Anxiety   . Arthritis   . Breast cancer ([place]) 2016   DCIS ER-/PR-  . Cerebral aneurysm, nonruptured   . Clostridium difficile infection   . Depressive disorder, not elsewhere classified   . Diverticulosis of colon (without mention of hemorrhage)   . Esophageal reflux   . Fibromyalgia   . Gastritis   . Gastritis   . GERD (gastroesophageal reflux disease)   . GI bleed   . Hiatal hernia   . Hyperlipidemia   . Hypertension   . Hypothyroidism   . Internal hemorrhoids   . Obstructive sleep apnea (adult) (pediatric)   . Ostium secundum type atrial septal defect   . Other chronic nonalcoholic liver disease   . Other pulmonary embolism and infarction   . Paroxysmal ventricular tachycardia ([place])   . Personal history of radiation therapy   . Pneumonia    history  . Presence of permanent cardiac pacemaker   . PUD (peptic ulcer disease)   . Radiation 02/03/15-03/10/15   Right Breast  . Sarcoid   . Schatzki's ring   . Stroke [facility]) 2013   tia  . Takotsubo syndrome   . Tubular adenoma of colon   . Unspecified transient cerebral ischemia   . Unspecified vitamin D deficiency    Past Surgical History:  Procedure  Laterality Date  . ABDOMINAL HYSTERECTOMY    . ABI  2006   normal  . BREAST EXCISIONAL BIOPSY    . BREAST LUMPECTOMY     right breast 2016  . BREAST LUMPECTOMY WITH RADIOACTIVE SEED LOCALIZATION Right 01/03/2015   Procedure: BREAST LUMPECTOMY WITH RADIOACTIVE SEED LOCALIZATION;  Surgeon: Autumn Messing III, MD;  Location: [facility];  Service: General;  Laterality: Right;  . CARDIAC DEFIBRILLATOR PLACEMENT  2006; 2012   BSX single chamber ICD  . carotid dopplers  2006   neg  . CEREBRAL ANEURYSM REPAIR  02/1999  . hospitalization  2004   GI bleed, PUD, diverticulosis (EGD,colonscopy)  . hospitalization     PE, NSVT, s/p defib  . JOINT REPLACEMENT  2017   left knee   . KNEE ARTHROSCOPY     bilateral  . LEFT HEART CATHETERIZATION WITH CORONARY ANGIOGRAM N/A 02/22/2012   Procedure: LEFT HEART CATHETERIZATION WITH CORONARY ANGIOGRAM;  Surgeon: Burnell Blanks, MD;  Location: [facility] CATH LAB;  Service: Cardiovascular;  Laterality: N/A;  . PARTIAL HYSTERECTOMY     Fibroids  . TOTAL KNEE ARTHROPLASTY Left 02/13/2016   Procedure: TOTAL KNEE ARTHROPLASTY;  Surgeon: Vickey Huger, MD;  Location: [facility];  Service: Orthopedics;  Laterality: Left;   Family History  Problem Relation Age of Onset  . Diabetes Mother   .  Alzheimer's disease Mother   . Other Brother        Thyroid problem 10/2016  . Allergies Sister   . Heart disease Sister   . Heart disease Brother   . Prostate cancer Brother 18       same brother as throat cancer  . Throat cancer Brother        dx in his 38s; also a smoker  . Heart disease Father   . Cancer Maternal Grandmother 50       colon cancer or abdominal cancer  . Lung cancer Maternal Grandfather 33  . Breast cancer Maternal Aunt 2  . Colon cancer Maternal Aunt 71       same sister as breast at 32  . Lung cancer Maternal Uncle   . Breast cancer Maternal Aunt        dx in her 85s  . Pancreatic cancer Cousin 103       maternal first cousin  . Breast cancer Cousin         paternal first cousin twice removed died in her 70s  . Cardiomyopathy Unknown        Family history  . Heart disease Son        Cardiac Arrest 07/2016  . Stroke Neg Hx       Review of Systems Per HPI    Objective:   Physical Exam  Constitutional: She is oriented to person, place, and time. She appears well-developed and well-nourished. No distress.  HENT:  Head: Normocephalic and atraumatic.  Right Ear: Tympanic membrane, external ear and ear canal normal.  Left Ear: Tympanic membrane, external ear and ear canal normal.  Nose: Mucosal edema present.  Mouth/Throat: Mucous membranes are normal. Posterior oropharyngeal erythema present. No oropharyngeal exudate, posterior oropharyngeal edema or tonsillar abscesses.  Eyes: Conjunctivae are normal.  Neck: Normal range of motion. Neck supple.  Cardiovascular: Normal rate, regular rhythm and normal heart sounds.  Pulmonary/Chest: Effort normal and breath sounds normal.  Lymphadenopathy:    She has no cervical adenopathy.  Neurological: She is alert and oriented to person, place, and time.  Skin: Skin is warm and dry. She is not diaphoretic.  Psychiatric: She has a normal mood and affect. Her behavior is normal. Judgment and thought content normal.  Vitals reviewed.     BP 128/66   Pulse 69   Temp 98.4 F (36.9 C) (Oral)   Wt 221 lb (100.2 kg)   LMP  (LMP Unknown)   SpO2 96%   BMI 35.14 kg/m  Wt Readings from Last 3 Encounters:  08/14/17 221 lb (100.2 kg)  08/06/17 224 lb (101.6 kg)  07/17/17 224 lb (101.6 kg)   Results for orders placed or performed in visit on 08/14/17  Rapid Strep A  Result Value Ref Range   Rapid Strep A Screen Negative Negative        Assessment & Plan:  1. Pharyngitis, unspecified etiology - likely viral, will send for culture confirmation - Rapid Strep A- negative - continue symptomatic treatment while awaiting culture results - encouraged her to take prednisone once fever free for 24  hours and return to ENT if no improvement of sinus symptoms  Clarene Reamer, FNP-BC  [facility]  08/14/2017 2:31 PM

## 2017-08-14 NOTE — Telephone Encounter (Signed)
PLEASE NOTE: All timestamps contained within this report are represented as [place] Standard Time. CONFIDENTIALTY NOTICE: This fax transmission is intended only for the addressee. It contains information that is legally privileged, confidential or otherwise protected from use or disclosure. If you are not the intended recipient, you are strictly prohibited from reviewing, disclosing, copying using or disseminating any of this information or taking any action in reliance on or regarding this information. If you have received this fax in error, please notify us immediately by telephone so that we can arrange for its return to [place]. Phone: [phone], Toll-Free: [phone], Fax: [phone] Page: 1 of 2 Call Id: [id number] [place] Patient Name: [name] Gender: Female DOB: 10/13/1950 Age: 66 Y 10 M 14 D Return Phone Number: [phone] (Primary) Address: City/State/Zip:  [place] [address] Client [facility] Night - Client Client Site [facility], Roque Lias - MD Contact Type Call Who Is Calling Patient / Member / Family / Caregiver Call Type Triage / Clinical Relationship To Patient Self Return Phone Number [phone] (Primary) Chief Complaint Fever (non urgent symptom) (> THREE MONTHS) Reason for Call Symptomatic / Request for [place] states she is running a fever 100.7 and has a gray line in the back of throat. Her throat is red. Additional Comment No UC 's open pt will go to ED Translation No Nurse Assessment Nurse: Venetia Maxon, RN, Manuela Schwartz Date/Time (Eastern Time): 08/12/2017 7:05:50 PM Confirm and document reason for call. If symptomatic, describe symptoms. ---Caller states she is running a fever 100.7 oral and has a gray line in the back of throat. Her throat is red. symptoms last  night . 99.7 oral . Drinking liquids. last urine was 5 min ago Does the patient have any new or worsening symptoms? ---Yes Will a triage be completed? ---Yes Related visit to physician within the last 2 weeks? ---No Does the PT have any chronic conditions? (i.e. diabetes, asthma, etc.) ---Yes List chronic conditions. ---sinus infection . 2 rounds of abx . she was given prednisone. not taken yet CHF HTN , NIDDM CBS does not check Is this a behavioral health or substance abuse call? ---No Guidelines Guideline Title Affirmed Question Affirmed Notes Nurse Date/Time Eilene Ghazi Time) Sinus Pain or Congestion [1] Fever > 100.0 F (37.8 C) AND [2] diabetes mellitus or weak immune system (e.g., HIV positive, cancer chemo, splenectomy, chronic steroids) Venetia Maxon, RN, Manuela Schwartz 08/12/2017 7:09:47 PM Disp. Time Eilene Ghazi Time) Disposition Final User PLEASE NOTE: All timestamps contained within this report are represented as [place] Standard Time. CONFIDENTIALTY NOTICE: This fax transmission is intended only for the addressee. It contains information that is legally privileged, confidential or otherwise protected from use or disclosure. If you are not the intended recipient, you are strictly prohibited from reviewing, disclosing, copying using or disseminating any of this information or taking any action in reliance on or regarding this information. If you have received this fax in error, please notify us immediately by telephone so that we can arrange for its return to [place]. Phone: [phone], Toll-Free: [phone], Fax: [phone] Page: 2 of 2 Call Id: [id number] 08/12/2017 7:11:10 PM See Physician within 4 Hours (or PCP triage) Yes Venetia Maxon, RN, Edwena Bunde Disagree/Comply Comply Caller Understands Yes PreDisposition Go to ED Care Advice Given Per Guideline SEE PHYSICIAN WITHIN 4 HOURS (or PCP triage): CALL BACK IF: * You become worse. CARE ADVICE given  per Sinus Pain or Congestion (Adult)  guideline. Referrals [facility] - ED

## 2017-08-14 NOTE — Telephone Encounter (Signed)
I spoke with pt and she did not go to ED or UC;pt scheduled appt 08/14/17 at 2 pm with Glenda Chroman FNP.

## 2017-08-14 NOTE — Patient Instructions (Signed)
Your rapid strep test is negative. I have sent a swab off for a culture and will let you know of the results in a couple of days.   Please continue Tylenol and warm salt water gargles. Once fever free for 24 hours, please start prednisone and follow up with ear nose and throat doctor if not better.

## 2017-08-16 LAB — CULTURE, UPPER RESPIRATORY
MICRO NUMBER: 81448440
SPECIMEN QUALITY: ADEQUATE

## 2017-08-17 ENCOUNTER — Other Ambulatory Visit: Payer: Self-pay | Admitting: Family Medicine

## 2017-08-17 MED ORDER — AMOXICILLIN 500 MG PO CAPS: 500.0000 mg | ORAL_CAPSULE | Freq: Two times a day (BID) | ORAL | 0 refills | Status: DC

## 2017-08-17 NOTE — Progress Notes (Signed)
m °

## 2017-09-04 ENCOUNTER — Ambulatory Visit (INDEPENDENT_AMBULATORY_CARE_PROVIDER_SITE_OTHER): Payer: Medicare Other | Admitting: *Deleted

## 2017-09-04 DIAGNOSIS — I472 Ventricular tachycardia, unspecified: Secondary | ICD-10-CM

## 2017-09-05 NOTE — Progress Notes (Signed)
Remote ICD transmission.   

## 2017-09-06 ENCOUNTER — Encounter: Payer: Self-pay | Admitting: Cardiology

## 2017-09-13 LAB — CUP PACEART REMOTE DEVICE CHECK
Brady Statistic RV Percent Paced: 0 %
HighPow Impedance: 69 Ohm
Implantable Lead Implant Date: 20050603
Implantable Lead Serial Number: 116340
Lead Channel Impedance Value: 654 Ohm
Lead Channel Pacing Threshold Amplitude: 0.8 V
Lead Channel Pacing Threshold Pulse Width: 0.4 ms
Lead Channel Setting Pacing Pulse Width: 0.4 ms
MDC IDC LEAD LOCATION: 753860
MDC IDC MSMT BATTERY REMAINING LONGEVITY: 66 mo
MDC IDC MSMT BATTERY REMAINING PERCENTAGE: 73 %
MDC IDC PG IMPLANT DT: 20110505
MDC IDC SESS DTM: 20190116080200
MDC IDC SET LEADCHNL RV PACING AMPLITUDE: 2.4 V
MDC IDC SET LEADCHNL RV SENSING SENSITIVITY: 0.4 mV
Pulse Gen Serial Number: 266301

## 2017-09-26 ENCOUNTER — Other Ambulatory Visit: Payer: Self-pay | Admitting: Cardiology

## 2017-09-26 NOTE — Telephone Encounter (Signed)
REFILL 

## 2017-10-23 ENCOUNTER — Ambulatory Visit: Payer: Self-pay | Admitting: *Deleted

## 2017-10-23 NOTE — Telephone Encounter (Signed)
Pt reports left sided neck and shoulder pain x 3 months; constant 3/10. Now with numbness left index finger, extends "a little ways below finger." Onset 2 days ago. States numbness "worse at night." No warmth, redness or swelling. Denies any CP, SOB, no pain on inspiration. Denies fever. Occasionally takes tylenol which is effective. Neck pain originates "in back of head." Numbness of finger does not interfere with ADLs. Appt made for Friday with Dr. Glori Bickers. Care advise given. Instructed to call back if symptoms worsen, SOB, CP, increased numbness,pain.  Reason for Disposition . [1] Neck pain AND [2] pain shoots into fingers  Answer Assessment - Initial Assessment Questions 1. ONSET: "When did the pain start?"     "Numbness"  2 days ago; neck pain 3 months ago 2. LOCATION and RADIATION: "Where is the pain located?"  (e.g., fingertip, around nail, joint, entire  finger)      Left index finger to right  belowfinger 3. SEVERITY: "How bad is the pain?" "What does it keep you from doing?"   (Scale 1-10; or mild, moderate, severe)  - MILD - doesn't interfere with normal activities   - MODERATE - interferes with normal activities or awakens from sleep  - SEVERE - excruciating pain, unable to hold a glass of water or bend finger even a little     Numbness constant; mild. Neck pain 3/10 4. APPEARANCE: "What does the finger look like?" (e.g., redness, swelling, bruising, pallor)     WNL 5. WORK OR EXERCISE: "Has there been any recent work or exercise that involved this part of the body?"     No 6. CAUSE: "What do you think is causing the pain?"     Unsure, maybe related to neck pain 7. AGGRAVATING FACTORS: "What makes the pain worse?" (e.g., using computer)     "Worse at night" 8. OTHER SYMPTOMS: "Do you have any other symptoms?" (e.g., fever, neck pain, numbness)     no 9. PREGNANCY: "Is there any chance you are pregnant?" "When was your last menstrual period?"     no  Protocols used: FINGER  PAIN-A-AH

## 2017-10-25 ENCOUNTER — Encounter: Payer: Self-pay | Admitting: Family Medicine

## 2017-10-25 ENCOUNTER — Ambulatory Visit (INDEPENDENT_AMBULATORY_CARE_PROVIDER_SITE_OTHER): Payer: Medicare Other | Admitting: Family Medicine

## 2017-10-25 ENCOUNTER — Ambulatory Visit (INDEPENDENT_AMBULATORY_CARE_PROVIDER_SITE_OTHER)
Admission: RE | Admit: 2017-10-25 | Discharge: 2017-10-25 | Disposition: A | Discharge status: Home / Self Care | Payer: Medicare Other | Source: Ambulatory Visit | Attending: Family Medicine | Admitting: Family Medicine

## 2017-10-25 VITALS — BP 128/72 | HR 67 | Temp 97.8°F | Ht 66.5 in | Wt 221.2 lb

## 2017-10-25 DIAGNOSIS — M542 Cervicalgia: Secondary | ICD-10-CM | POA: Diagnosis not present

## 2017-10-25 DIAGNOSIS — J479 Bronchiectasis, uncomplicated: Secondary | ICD-10-CM | POA: Diagnosis not present

## 2017-10-25 DIAGNOSIS — C50411 Malignant neoplasm of upper-outer quadrant of right female breast: Secondary | ICD-10-CM

## 2017-10-25 DIAGNOSIS — I472 Ventricular tachycardia, unspecified: Secondary | ICD-10-CM

## 2017-10-25 DIAGNOSIS — I4729 Other ventricular tachycardia: Secondary | ICD-10-CM

## 2017-10-25 DIAGNOSIS — I5042 Chronic combined systolic (congestive) and diastolic (congestive) heart failure: Secondary | ICD-10-CM

## 2017-10-25 DIAGNOSIS — R202 Paresthesia of skin: Secondary | ICD-10-CM | POA: Diagnosis not present

## 2017-10-25 DIAGNOSIS — Z170 Estrogen receptor positive status [ER+]: Secondary | ICD-10-CM | POA: Diagnosis not present

## 2017-10-25 NOTE — Patient Instructions (Signed)
Use warm compresses and stretches for your neck Baclofen if needed   Get a cervical support pillow - made of memory foam  This may help a lot   Let get a cervical spine xray today  Will contact you with result and plan

## 2017-10-25 NOTE — Progress Notes (Signed)
Subjective:    Patient ID: [name], female    DOB: 08-05-1951, 67 y.o.   MRN: [id number]  HPI  here with neck pain with L hand tingling  occ light headed   Feels tingly/numb - L index finger  Is right handed  No loss of grip    Neck hurts in L side - (she keeps adjusting pillow in bed)  Cannot get comfortable  Worse to turn L and right (worse than flex or ext) The pains do not shoot down her arm     Wt Readings from Last 3 Encounters:  10/25/17 221 lb 4 oz (100.4 kg)  08/14/17 221 lb (100.2 kg)  08/06/17 224 lb (101.6 kg)   BP Readings from Last 3 Encounters:  10/25/17 128/72  08/14/17 128/66  08/06/17 124/80   Pulse Readings from Last 3 Encounters:  10/25/17 67  08/14/17 69  08/06/17 60    Patient Active Problem List   Diagnosis Date Noted  . Neck pain 10/25/2017  . Paresthesias in left hand 10/25/2017  . Acute sinusitis 05/17/2017  . Paresthesia of foot, bilateral 04/12/2017  . Itching 02/22/2017  . Primary osteoarthritis of both hands 09/27/2016  . Primary osteoarthritis of both knees 09/27/2016  . Urinary frequency 09/26/2016  . Screen for STD (sexually transmitted disease) 09/26/2016  . Nasopharyngitis, 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/2016  . Postnasal drip 05/18/2016  . S/P total knee replacement 02/13/2016  . Chronic pain of both knees 12/06/2015  . Globus pharyngeus 05/17/2015  . Productive cough 05/17/2015  . Genetic testing 12/23/2014  . Family history of breast cancer   . Family history of colon cancer   . Family history of pancreatic cancer   . Breast cancer of upper-outer quadrant of right female breast ([place]) 11/23/2014  . Allergy to ACE inhibitors 09/27/2014  . Encounter for Medicare annual wellness exam 06/27/2014  . Hyperglycemia 06/23/2013  . Routine general medical examination at a health care facility 06/23/2013  . Dizziness and giddiness 01/16/2013  . Disturbance of skin sensation 01/16/2013  . RLS (restless legs syndrome) 01/15/2013  . Hx of  Clostridium difficile infection 10/10/2012  . Bronchiectasis without acute exacerbation ([place]) 04/29/2012  . Dyspnea on exertion 04/02/2012  . Non-ischemic cardiomyopathy ([place]) 03/07/2012  . Colon cancer screening 01/11/2012  . Post-menopausal 01/11/2012  . Chest pain 11/07/2011  . Palpitations 04/17/2011  . Abnormal tympanic membrane 01/22/2011  . Obstructive sleep apnea 04/04/2010  . VENTRICULAR TACHYCARDIA 01/03/2009  . ICD  [employer]  Single chamber 01/03/2009  . PATENT FORAMEN OVALE 09/29/2008  . Vitamin D deficiency 08/17/2008  . Hypothyroidism 11/18/2006  . Hyperlipidemia 11/18/2006  . DEPRESSION 11/18/2006  . Essential hypertension 11/18/2006  . MITRAL VALVE PROLAPSE 11/18/2006  . CEREBRAL ANEURYSM 11/18/2006  . Allergic rhinitis 11/18/2006  . GERD 11/18/2006  . DIVERTICULOSIS, COLON 11/18/2006  . Fatty liver 11/18/2006  . Fibromyalgia 11/18/2006   Past Medical History:  Diagnosis Date  . AICD (automatic cardioverter/defibrillator) present   . Anxiety   . Arthritis   . 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 Cerebral aneurysm, nonruptured   . Clostridium difficile infection   . Depressive disorder, not elsewhere classified   . Diverticulosis of colon (without mention of hemorrhage)   . Esophageal reflux   . Fibromyalgia   . Gastritis   . Gastritis   . GERD (gastroesophageal reflux disease)   . GI bleed   . Hiatal hernia   . Hyperlipidemia   . Hypertension   .  Hypothyroidism   . Internal hemorrhoids   . Obstructive sleep apnea (adult) (pediatric)   . Ostium secundum type atrial septal defect   . Other chronic nonalcoholic liver disease   . Other pulmonary embolism and infarction   . Paroxysmal ventricular tachycardia ([place])   . Personal history of radiation therapy   . Pneumonia    history  . Presence of permanent cardiac pacemaker   . PUD (peptic ulcer disease)   . Radiation 02/03/15-03/10/15   Right Breast  . Sarcoid   . Schatzki's ring     . Stroke [facility]) 2013   tia  . Takotsubo syndrome   . Tubular adenoma of colon   . Unspecified transient cerebral ischemia   . Unspecified vitamin D deficiency    Past Surgical History:  Procedure Laterality Date  . ABDOMINAL HYSTERECTOMY    . ABI  2006   normal  . BREAST EXCISIONAL BIOPSY    . BREAST LUMPECTOMY     right breast 2016  . BREAST LUMPECTOMY WITH RADIOACTIVE SEED LOCALIZATION Right 01/03/2015   Procedure: BREAST LUMPECTOMY WITH RADIOACTIVE SEED LOCALIZATION;  Surgeon: Autumn Messing III, MD;  Location: [place];  Service: General;  Laterality: Right;  . CARDIAC DEFIBRILLATOR PLACEMENT  2006; 2012   BSX single chamber ICD  . carotid dopplers  2006   neg  . CEREBRAL ANEURYSM REPAIR  02/1999  . hospitalization  2004   GI bleed, PUD, diverticulosis (EGD,colonscopy)  . hospitalization     PE, NSVT, s/p defib  . JOINT REPLACEMENT  2017   left knee   . KNEE ARTHROSCOPY     bilateral  . LEFT HEART CATHETERIZATION WITH CORONARY ANGIOGRAM N/A 02/22/2012   Procedure: LEFT HEART CATHETERIZATION WITH CORONARY ANGIOGRAM;  Surgeon: Burnell Blanks, MD;  Location[facility] C[facility] LAB;  Service: Cardiovascular;  Laterality: N/A;  . PARTIAL HYSTERECTOMY     Fibroids  . TOTAL KNEE ARTHROPLASTY Left 02/13/2016   Procedure: TOTAL KNEE ARTHROPLASTY;  Surgeon: Vickey Huger, MD;  Location: [place];  Service: Orthopedics;  Laterality: Left;   Social History   Tobacco Use  . Smoking status: Former Smoker    Packs/day: 1.00    Years: 15.00    Pack years: 15.00    Types: Cigarettes    Last attempt to quit: 08/20/1989    Years since quitting: 28.2  . Smokeless tobacco: Never Used  . Tobacco comment: Started at 45; less than 1 PPD  Substance Use Topics  . Alcohol use: Yes    Alcohol/week: 0.0 oz    Comment: rare  . Drug use: No   Family History  Problem Relation Age of Onset  . Diabetes Mother   . Alzheimer's disease Mother   . Other Brother        Thyroid problem 10/2016  . Allergies Sister    . Heart disease Sister   . Heart disease Brother   . Prostate cancer Brother 41       same brother as throat cancer  . Throat cancer Brother        dx in his 33s; also a smoker  . Heart disease Father   . Cancer Maternal Grandmother 9       colon cancer or abdominal cancer  . Lung cancer Maternal Grandfather 56  . Breast cancer Maternal Aunt 69  . Colon cancer Maternal Aunt 10       same sister as breast at 10  . Lung cancer Maternal Uncle   . Breast cancer  Maternal Aunt        dx in her 44s  . Pancreatic cancer Cousin 10       maternal first cousin  . Breast cancer Cousin        paternal first cousin twice removed died in her 75s  . Cardiomyopathy Unknown        Family history  . Heart disease Son        Cardiac Arrest 07/2016  . Stroke Neg Hx    Allergies  Allergen Reactions  . Ace Inhibitors Swelling    Angioedema; makes tongue "break out" Angioedema; makes tongue "break out"  . Atorvastatin Other (See Comments)    SEVERE MYALGIAS, MUSCLE PAIN SEVERE MYALGIAS, MUSCLE PAIN  . Ramipril Other (See Comments)    TONGUE ULCERS TONGUE ULCERS  . Rosuvastatin Other (See Comments)    MYALGIAS, SEVERE MUSCLE PAIN MYALGIAS, SEVERE MUSCLE PAIN  . Amitriptyline Hcl Other (See Comments)    Unknown  . Amitriptyline Hcl Other (See Comments)    Unknown  . Iodine-131 Other (See Comments)  . Paroxetine Other (See Comments)    UNKNOWN UNKNOWN  . Carbamazepine Rash    REACTION: ? reaction REACTION: ? reaction  . Cetirizine Hcl Other (See Comments)    REACTION: sleepy  . Cetirizine Hcl Other (See Comments)    REACTION: sleepy  . Codeine Itching  . Dilantin [Phenytoin Sodium Extended] Rash  . Phenytoin Sodium Extended Rash   Current Outpatient Medications on File Prior to Visit  Medication Sig Dispense Refill  . acetaminophen (TYLENOL) 500 MG tablet Take 500 mg by mouth every 6 (six) hours as needed (pain).     [name] albuterol (VENTOLIN HFA) 108 (90 Base) MCG/ACT inhaler  Inhale 2 puffs into the lungs every 6 (six) hours as needed for wheezing or shortness of breath. 1 Inhaler 5  . aspirin 81 MG tablet Take 81 mg by mouth daily.    . baclofen (LIORESAL) 10 MG tablet TAKE 1 TABLET THREE TIMES DAILY 270 tablet 1  . calcium carbonate (OSCAL) 1500 (600 Ca) MG TABS tablet Take 600 mg of elemental calcium by mouth daily with breakfast.    . cholecalciferol (VITAMIN D) 1000 UNITS tablet Take 5,000 Units by mouth daily.     . fexofenadine (ALLEGRA) 180 MG tablet Take 180 mg by mouth daily as needed for allergies or rhinitis.    . fluticasone (FLONASE) 50 MCG/ACT nasal spray Place 2 sprays into both nostrils daily as needed for allergies or rhinitis. 48 g 3  . furosemide (LASIX) 40 MG tablet Take 1 tablet by mouth once daily as needed for increased swelling/ shortness of breath 90 tablet 3  . latanoprost (XALATAN) 0.005 % ophthalmic solution Place 1 drop into both eyes at bedtime.    [name] levothyroxine (SYNTHROID, LEVOTHROID) 50 MCG tablet Take 1 tablet (50 mcg total) by mouth daily. 90 tablet 3  . mometasone (ELOCON) 0.1 % lotion     . montelukast (SINGULAIR) 10 MG tablet Take 1 tablet (10 mg total) by mouth at bedtime. 90 tablet 3  . Multiple Vitamins-Minerals (MULTIVITAMIN PO) Take 1 tablet by mouth daily.    . pantoprazole (PROTONIX) 40 MG tablet TAKE 1 TABLET EVERY DAY 90 tablet 1  . polyethylene glycol powder (GLYCOLAX/MIRALAX) powder Take 17 grams by mouth daily 1080 g 0  . potassium chloride (K-DUR) 10 MEQ tablet TAKE 1/2 TABLET (5MEQ TOTAL) EVERY DAY 45 tablet 1  . simvastatin (ZOCOR) 40 MG tablet TAKE 1/2 TABLET  (20  MG  TOTAL) EVERY DAY 45 tablet 1  . traMADol (ULTRAM) 50 MG tablet Take 1 tablet (50 mg total) by mouth at bedtime. 30 tablet 1  . zolpidem (AMBIEN) 10 MG tablet Take 1 tablet (10 mg total) by mouth at bedtime as needed. for sleep 90 tablet 1  . carvedilol (COREG) 12.5 MG tablet Take 1 tablet (12.5 mg total) by mouth 2 (two) times daily. 180 tablet 3   . [DISCONTINUED] metoprolol succinate (TOPROL-XL) 50 MG 24 hr tablet Take 2 tablets (100 mg total) by mouth daily. Take with or immediately following a meal. 90 tablet 3   No current facility-administered medications on file prior to visit.     Review of Systems  Constitutional: Negative for activity change, appetite change, fatigue, fever and unexpected weight change.  HENT: Negative for congestion, ear pain, rhinorrhea, sinus pressure and sore throat.   Eyes: Negative for pain, redness and visual disturbance.  Respiratory: Negative for cough, shortness of breath and wheezing.   Cardiovascular: Negative for chest pain and palpitations.  Gastrointestinal: Negative for abdominal pain, blood in stool, constipation and diarrhea.  Endocrine: Negative for polydipsia and polyuria.  Genitourinary: Negative for dysuria, frequency and urgency.  Musculoskeletal: Negative for arthralgias, back pain and myalgias.       Neck pain L side   Skin: Negative for pallor and rash.  Allergic/Immunologic: Negative for environmental allergies.  Neurological: Positive for numbness. Negative for dizziness, syncope and headaches.       N.t of L index finger   Hematological: Negative for adenopathy. Does not bruise/bleed easily.  Psychiatric/Behavioral: Negative for decreased concentration and dysphoric mood. The patient is not nervous/anxious.        Objective:   Physical Exam  Constitutional: She appears well-developed and well-nourished. No distress.  obese and well appearing   HENT:  Head: Normocephalic and atraumatic.  Mouth/Throat: Oropharynx is clear and moist.  Eyes: Conjunctivae and EOM are normal. Pupils are equal, round, and reactive to light. No scleral icterus.  Neck: Normal range of motion. Neck supple.  Cardiovascular: Normal rate, regular rhythm and normal heart sounds.  Pulmonary/Chest: Effort normal and breath sounds normal. No respiratory distress. She has no wheezes.  Musculoskeletal:  She exhibits tenderness. She exhibits no edema or deformity.       Cervical back: She exhibits tenderness. She exhibits normal range of motion, no bony tenderness, no edema and no deformity.  Tender L cervical musculature  No bony tenderness Pain on full CS flex and rotation to the L  Nl strength No crepitus   Lymphadenopathy:    She has no cervical adenopathy.  Neurological: She displays no atrophy and no tremor. A sensory deficit is present. She exhibits normal muscle tone. She displays a negative Romberg sign. Coordination normal.  L hand= 4/5 sens to lt touch and temp -primarily index finger Neg tinel and phalen tests   Nl grip    Skin: Skin is warm and dry. No rash noted. No erythema. No pallor.  Psychiatric: She has a normal mood and affect.          Assessment & Plan:   Problem List Items Addressed This Visit      Cardiovascular and Mediastinum   VENTRICULAR TACHYCARDIA    In control        Respiratory   Bronchiectasis without acute exacerbation (HCC)    Stable without flare         Other   Breast cancer of upper-outer quadrant of right female  breast ([place])    Doing well  Continues f/u      Neck pain - Primary    L sided neck pain with poss radiculopathy (L index finger paresthesia)  Disc stretching/use of heat Will get a cervical supp pillow/foam (bothers her most at night)  Cs film now Has baclofen from rheum for use prn as well  Update       Relevant Orders   DG Cervical Spine Complete (Completed)   Paresthesias in left hand    I do wonder if this is related to L sided neck pain  CS xray today  Reassuring exam No loss of strength or grip   ? Also if related to big toe complaints -she has seen neuro        Other Visit Diagnoses    Chronic combined systolic and diastolic congestive heart failure (HCC)   (Chronic)     no problems currently   Ventricular tachycardia (HCC)   (Chronic)     in control   Bronchiectasis without complication (HCC)    (Chronic)

## 2017-10-25 NOTE — Assessment & Plan Note (Signed)
L sided neck pain with poss radiculopathy (L index finger paresthesia)  Disc stretching/use of heat Will get a cervical supp pillow/foam (bothers her most at night)  Cs film now Has baclofen from rheum for use prn as well  Update

## 2017-10-25 NOTE — Assessment & Plan Note (Signed)
I do wonder if this is related to L sided neck pain  CS xray today  Reassuring exam No loss of strength or grip   ? Also if related to big toe complaints -she has seen neuro

## 2017-10-27 NOTE — Assessment & Plan Note (Signed)
Stable without flare   

## 2017-10-27 NOTE — Assessment & Plan Note (Signed)
In control

## 2017-10-27 NOTE — Assessment & Plan Note (Signed)
Doing well  Continues f/u

## 2017-10-28 ENCOUNTER — Telehealth: Payer: Self-pay | Admitting: Family Medicine

## 2017-10-28 ENCOUNTER — Telehealth: Payer: Self-pay

## 2017-10-28 DIAGNOSIS — R202 Paresthesia of skin: Secondary | ICD-10-CM

## 2017-10-28 DIAGNOSIS — M542 Cervicalgia: Secondary | ICD-10-CM

## 2017-10-28 NOTE — Telephone Encounter (Signed)
Ref done  Will route to [facility] 

## 2017-10-28 NOTE — Telephone Encounter (Signed)
LMTCB for patient to call in regards to a referral-Anastasiya V Hopkins, RMA

## 2017-10-28 NOTE — Telephone Encounter (Signed)
-----   Message from Tammi Sou, [place] sent at 10/28/2017 12:56 PM EDT ----- Pt did view her results on mychart they were a little confusing to her so she does want to follow up with neurology but she will need a new referral to go back to them, she's okay seeing same neuro doc, please put referral in and I advise pt our [facility] will call to set up appt

## 2017-10-29 NOTE — Telephone Encounter (Signed)
Referral placed on [facility] they will call patient to schedule

## 2017-11-07 ENCOUNTER — Encounter: Payer: Self-pay | Admitting: Internal Medicine

## 2017-11-07 ENCOUNTER — Encounter: Payer: Self-pay | Admitting: *Deleted

## 2017-11-18 ENCOUNTER — Other Ambulatory Visit: Payer: Self-pay | Admitting: Podiatry

## 2017-11-18 ENCOUNTER — Ambulatory Visit (INDEPENDENT_AMBULATORY_CARE_PROVIDER_SITE_OTHER): Payer: Medicare Other | Admitting: Podiatry

## 2017-11-18 ENCOUNTER — Encounter: Payer: Self-pay | Admitting: Podiatry

## 2017-11-18 ENCOUNTER — Ambulatory Visit (INDEPENDENT_AMBULATORY_CARE_PROVIDER_SITE_OTHER): Payer: Medicare Other

## 2017-11-18 DIAGNOSIS — M2042 Other hammer toe(s) (acquired), left foot: Secondary | ICD-10-CM | POA: Diagnosis not present

## 2017-11-18 DIAGNOSIS — M79672 Pain in left foot: Secondary | ICD-10-CM

## 2017-11-18 DIAGNOSIS — M2041 Other hammer toe(s) (acquired), right foot: Secondary | ICD-10-CM

## 2017-11-18 DIAGNOSIS — M79671 Pain in right foot: Secondary | ICD-10-CM | POA: Diagnosis not present

## 2017-11-18 NOTE — Patient Instructions (Signed)
Pre-Operative Instructions  Congratulations, you have decided to take an important step towards improving your quality of life.  You can be assured that the doctors and staff at [facility] will be with you every step of the way.  Here are some important things you should know:  1. Plan to be at the surgery center/hospital at least 1 (one) hour prior to your scheduled time, unless otherwise directed by the surgical center/hospital staff.  You must have a responsible adult accompany you, remain during the surgery and drive you home.  Make sure you have directions to the surgical center/hospital to ensure you arrive on time. 2. If you are having surgery at [facility] or [facility] hospitals, you will need a copy of your medical history and physical form from your family physician within one month prior to the date of surgery. We will give you a form for your primary physician to complete.  3. We make every effort to accommodate the date you request for surgery.  However, there are times where surgery dates or times have to be moved.  We will contact you as soon as possible if a change in schedule is required.   4. No aspirin/ibuprofen for one week before surgery.  If you are on aspirin, any non-steroidal anti-inflammatory medications (Mobic, Aleve, Ibuprofen) should not be taken seven (7) days prior to your surgery.  You make take Tylenol for pain prior to surgery.  5. Medications - If you are taking daily heart and blood pressure medications, seizure, reflux, allergy, asthma, anxiety, pain or diabetes medications, make sure you notify the surgery center/hospital before the day of surgery so they can tell you which medications you should take or avoid the day of surgery. 6. No food or drink after midnight the night before surgery unless directed otherwise by surgical center/hospital staff. 7. No alcoholic beverages 24-hours prior to surgery.  No smoking 24-hours prior or 24-hours after  surgery. 8. Wear loose pants or shorts. They should be loose enough to fit over bandages, boots, and casts. 9. Don't wear slip-on shoes. Sneakers are preferred. 10. Bring your boot with you to the surgery center/hospital.  Also bring crutches or a walker if your physician has prescribed it for you.  If you do not have this equipment, it will be provided for you after surgery. 11. If you have not been contacted by the surgery center/hospital by the day before your surgery, call to confirm the date and time of your surgery. 12. Leave-time from work may vary depending on the type of surgery you have.  Appropriate arrangements should be made prior to surgery with your employer. 13. Prescriptions will be provided immediately following surgery by your doctor.  Fill these as soon as possible after surgery and take the medication as directed. Pain medications will not be refilled on weekends and must be approved by the doctor. 14. Remove nail polish on the operative foot and avoid getting pedicures prior to surgery. 15. Wash the night before surgery.  The night before surgery wash the foot and leg well with water and the antibacterial soap provided. Be sure to pay special attention to beneath the toenails and in between the toes.  Wash for at least three (3) minutes. Rinse thoroughly with water and dry well with a towel.  Perform this wash unless told not to do so by your physician.  Enclosed: 1 Ice pack (please put in freezer the night before surgery)   1 Hibiclens skin cleaner     Pre-op instructions  If you have any questions regarding the instructions, please do not hesitate to call our office.  [place]: [address], [place], [place] [address] -- [phone]  [place]: [address] [place], [place] [address] -- [phone]  [place]: [address]  [place], [place] [address] -- [phone]  [place]: [address], [address], [place],  [address] -- [phone]  Website:  [email or web address] 

## 2017-11-18 NOTE — Progress Notes (Signed)
Subjective:   Patient ID: [name], female   DOB: 67 y.o.   MRN: [id number]   HPI Patient presents with a lot of discomfort in the third digits of both feet and states that she knows these toes are too long and that they are hitting shoes and it is making increasingly uncomfortable for her to walk.  She is tried to trim them but she cannot do it she is tried to pad them without relief and also complains of a vague pain in her left big toe that she had nerve conduction study done with everything normal.  Patient does not smoke and likes to be active   Review of Systems  All other systems reviewed and are negative.       Objective:  Physical Exam  Constitutional: She appears well-developed and well-nourished.  Cardiovascular: Intact distal pulses.  Pulmonary/Chest: Effort normal.  Musculoskeletal: Normal range of motion.  Neurological: She is alert.  Skin: Skin is warm.  Nursing note and vitals reviewed.   Neurovascular status intact muscle strength adequate range of motion within normal limits with patient noted to have significant digital deformities digit 3 bilateral with distal keratotic lesions that are very painful and elongated third toes of both feet.  Patient is noted to have good digital perfusion is well oriented x3 with no indication of neurological loss of the big toe left and no indications of ingrown toenail deformity.  Patient has good digital perfusion and is well oriented x3     Assessment:  Hammertoe deformity digit 3 bilateral with distal keratotic lesions that are painful when palpated along with possibility for mild neuropathic-like discomfort left big toe with no indications of advanced pathology     Plan:  H&P conditions reviewed and at this point due to the intensity of discomfort in the distal keratotic lesions I discussed aggressive trimming padding versus surgery and patient states padding and tremor is not helped her and she wants the toe shortened and  fixed.  She needs to get it done soon due to her schedule and I allowed her to read a consent form today spending a great deal of time going over alternative treatments complications.  Patient will have digital fusion digit 3 bilateral along with small distal arthroplasty to lift the toes up off the distal surface and I explained the surgery to her and all risk and the fact she will have pins and the total recovery will take 6 months to 1 year and there is no guarantee that point will go away.  Patient wants surgery signed consent form is given all preoperative instructions and is encouraged to call with any questions she may have  X-ray indicates the elongated third metatarsal bilateral with distal keratotic lesion digit 3 bilateral signed visit

## 2017-11-25 ENCOUNTER — Other Ambulatory Visit: Payer: Self-pay | Admitting: Family Medicine

## 2017-11-25 ENCOUNTER — Other Ambulatory Visit: Payer: Self-pay | Admitting: Hematology

## 2017-11-25 DIAGNOSIS — Z853 Personal history of malignant neoplasm of breast: Secondary | ICD-10-CM

## 2017-11-26 ENCOUNTER — Telehealth: Payer: Self-pay | Admitting: Family Medicine

## 2017-11-26 DIAGNOSIS — I10 Essential (primary) hypertension: Secondary | ICD-10-CM

## 2017-11-26 DIAGNOSIS — E559 Vitamin D deficiency, unspecified: Secondary | ICD-10-CM

## 2017-11-26 DIAGNOSIS — R739 Hyperglycemia, unspecified: Secondary | ICD-10-CM

## 2017-11-26 DIAGNOSIS — E7800 Pure hypercholesterolemia, unspecified: Secondary | ICD-10-CM

## 2017-11-26 DIAGNOSIS — E039 Hypothyroidism, unspecified: Secondary | ICD-10-CM

## 2017-11-26 NOTE — Telephone Encounter (Signed)
-----   Message from Eustace Pen, LPN sent at 04/26/2835  5:08 PM EDT ----- Regarding: Labs 4/10 Lab orders needed. Thank you.  Insurance:  [employer]

## 2017-11-27 ENCOUNTER — Ambulatory Visit (INDEPENDENT_AMBULATORY_CARE_PROVIDER_SITE_OTHER): Payer: Medicare Other

## 2017-11-27 ENCOUNTER — Other Ambulatory Visit: Payer: Self-pay

## 2017-11-27 VITALS — BP 122/78 | HR 65 | Temp 97.9°F | Ht 66.75 in | Wt 220.5 lb

## 2017-11-27 DIAGNOSIS — Z0000 Encounter for general adult medical examination without abnormal findings: Secondary | ICD-10-CM

## 2017-11-27 DIAGNOSIS — E039 Hypothyroidism, unspecified: Secondary | ICD-10-CM

## 2017-11-27 DIAGNOSIS — I10 Essential (primary) hypertension: Secondary | ICD-10-CM

## 2017-11-27 DIAGNOSIS — E7800 Pure hypercholesterolemia, unspecified: Secondary | ICD-10-CM | POA: Diagnosis not present

## 2017-11-27 DIAGNOSIS — R739 Hyperglycemia, unspecified: Secondary | ICD-10-CM

## 2017-11-27 DIAGNOSIS — E559 Vitamin D deficiency, unspecified: Secondary | ICD-10-CM | POA: Diagnosis not present

## 2017-11-27 LAB — COMPREHENSIVE METABOLIC PANEL
ALBUMIN: 4.1 g/dL (ref 3.5–5.2)
ALK PHOS: 100 U/L (ref 39–117)
ALT: 42 U/L — ABNORMAL HIGH (ref 0–35)
AST: 37 U/L (ref 0–37)
BUN: 9 mg/dL (ref 6–23)
CHLORIDE: 103 meq/L (ref 96–112)
CO2: 31 mEq/L (ref 19–32)
CREATININE: 0.78 mg/dL (ref 0.40–1.20)
Calcium: 9.9 mg/dL (ref 8.4–10.5)
GFR: 94.13 mL/min (ref >60.00)
GLUCOSE: 109 mg/dL — AB (ref 70–99)
POTASSIUM: 3.9 meq/L (ref 3.5–5.1)
SODIUM: 140 meq/L (ref 135–145)
TOTAL PROTEIN: 7.5 g/dL (ref 6.0–8.3)
Total Bilirubin: 0.4 mg/dL (ref 0.2–1.2)

## 2017-11-27 LAB — LIPID PANEL
CHOL/HDL RATIO: 3
Cholesterol: 159 mg/dL (ref 0–200)
HDL: 50.1 mg/dL (ref >39.00)
LDL Cholesterol: 84 mg/dL (ref 0–99)
NONHDL: 108.68
Triglycerides: 123 mg/dL (ref 0.0–149.0)
VLDL: 24.6 mg/dL (ref 0.0–40.0)

## 2017-11-27 LAB — CBC WITH DIFFERENTIAL/PLATELET
Basophils Absolute: 0 10*3/uL (ref 0.0–0.1)
Basophils Relative: 0.7 % (ref 0.0–3.0)
EOS ABS: 0.2 10*3/uL (ref 0.0–0.7)
EOS PCT: 4.2 % (ref 0.0–5.0)
HCT: 39.2 % (ref 36.0–46.0)
HEMOGLOBIN: 12.9 g/dL (ref 12.0–15.0)
LYMPHS ABS: 1.2 10*3/uL (ref 0.7–4.0)
Lymphocytes Relative: 28.2 % (ref 12.0–46.0)
MCHC: 33 g/dL (ref 30.0–36.0)
MCV: 88.8 fl (ref 78.0–100.0)
MONO ABS: 0.5 10*3/uL (ref 0.1–1.0)
Monocytes Relative: 11.6 % (ref 3.0–12.0)
Neutro Abs: 2.3 10*3/uL (ref 1.4–7.7)
Neutrophils Relative %: 55.3 % (ref 43.0–77.0)
Platelets: 205 10*3/uL (ref 150.0–400.0)
RBC: 4.42 Mil/uL (ref 3.87–5.11)
RDW: 12.8 % (ref 11.5–15.5)
WBC: 4.1 10*3/uL (ref 4.0–10.5)

## 2017-11-27 LAB — VITAMIN D 25 HYDROXY (VIT D DEFICIENCY, FRACTURES): VITD: 46.97 ng/mL (ref 30.00–100.00)

## 2017-11-27 LAB — TSH: TSH: 3.95 u[IU]/mL (ref 0.35–4.50)

## 2017-11-27 LAB — HEMOGLOBIN A1C: Hgb A1c MFr Bld: 5.6 % (ref 4.6–6.5)

## 2017-11-27 MED ORDER — ZOLPIDEM TARTRATE 10 MG PO TABS: 10.0000 mg | ORAL_TABLET | Freq: Every evening | ORAL | 1 refills | Status: DC | PRN

## 2017-11-27 NOTE — Progress Notes (Signed)
PCP notes:   Health maintenance:  Colonoscopy - appt scheduled Mammogram - appt scheduled  Abnormal screenings:   None  Patient concerns:   Lower back pain - chronic, 3/10, worsens when lying down  Tongue - appt scheduled with oral surgeon to discuss swelling and discomfort  Intermittent episodes of lightheadness - pt encouraged to drink fluids to reduce risk of dehydration; recently discontinued Furosemide and resumed HCTZ.   Medication refill - Ambien requested. Note sent to PCP.   Nurse concerns:  None  Next PCP appt:   12/03/17 @ 1045  I reviewed health advisor's note, was available for consultation, and agree with documentation and plan. Loura Pardon MD

## 2017-11-27 NOTE — Patient Instructions (Addendum)
Ms. Horvath , Thank you for taking time to come for your Medicare Wellness Visit. I appreciate your ongoing commitment to your health goals. Please review the following plan we discussed and let me know if I can assist you in the future.   These are the goals we discussed: Goals    . Weight < 176 lb (79.833 kg)     Starting 11/27/2017, I will attempt to reduce all added sugar in diet and increase exercise for up to 90 min 3 days per week.        This is a list of the screening recommended for you and due dates:  Health Maintenance  Topic Date Due  . Colon Cancer Screening  01/06/2018*  . Mammogram  01/04/2018  . Flu Shot  03/20/2018  . Tetanus Vaccine  09/08/2019  . DEXA scan (bone density measurement)  Completed  .  Hepatitis C: One time screening is recommended by Center for Disease Control  (CDC) for  adults born from [birth year] through 1967.   Completed  . Pneumonia vaccines  Completed  *Topic was postponed. The date shown is not the original due date.   Preventive Care for Adults  A healthy lifestyle and preventive care can promote health and wellness. Preventive health guidelines for adults include the following key practices.  . A routine yearly physical is a good way to check with your health care provider about your health and preventive screening. It is a chance to share any concerns and updates on your health and to receive a thorough exam.  . Visit your dentist for a routine exam and preventive care every 6 months. Brush your teeth twice a day and floss once a day. Good oral hygiene prevents tooth decay and gum disease.  . The frequency of eye exams is based on your age, health, family medical history, use  of contact lenses, and other factors. Follow your health care provider's recommendations for frequency of eye exams.  . Eat a healthy diet. Foods like vegetables, fruits, whole grains, low-fat dairy products, and lean protein foods contain the nutrients you need without too  many calories. Decrease your intake of foods high in solid fats, added sugars, and salt. Eat the right amount of calories for you. Get information about a proper diet from your health care provider, if necessary.  . Regular physical exercise is one of the most important things you can do for your health. Most adults should get at least 150 minutes of moderate-intensity exercise (any activity that increases your heart rate and causes you to sweat) each week. In addition, most adults need muscle-strengthening exercises on 2 or more days a week.  Silver Sneakers may be a benefit available to you. To determine eligibility, you may visit the website: www.silversneakers.com or contact program at 431-031-4501 Mon-Fri between 8AM-8PM.   . Maintain a healthy weight. The body mass index (BMI) is a screening tool to identify possible weight problems. It provides an estimate of body fat based on height and weight. Your health care provider can find your BMI and can help you achieve or maintain a healthy weight.   For adults 20 years and older: ? A BMI below 18.5 is considered underweight. ? A BMI of 18.5 to 24.9 is normal. ? A BMI of 25 to 29.9 is considered overweight. ? A BMI of 30 and above is considered obese.   . Maintain normal blood lipids and cholesterol levels by exercising and minimizing your intake of saturated fat. Eat  a balanced diet with plenty of fruit and vegetables. Blood tests for lipids and cholesterol should begin at age 48 and be repeated every 5 years. If your lipid or cholesterol levels are high, you are over 50, or you are at high risk for heart disease, you may need your cholesterol levels checked more frequently. Ongoing high lipid and cholesterol levels should be treated with medicines if diet and exercise are not working.  . If you smoke, find out from your health care provider how to quit. If you do not use tobacco, please do not start.  . If you choose to drink alcohol, please  do not consume more than 2 drinks per day. One drink is considered to be 12 ounces (355 mL) of beer, 5 ounces (148 mL) of wine, or 1.5 ounces (44 mL) of liquor.  . If you are 54-75 years old, ask your health care provider if you should take aspirin to prevent strokes.  . Use sunscreen. Apply sunscreen liberally and repeatedly throughout the day. You should seek shade when your shadow is shorter than you. Protect yourself by wearing long sleeves, pants, a wide-brimmed hat, and sunglasses year round, whenever you are outdoors.  . Once a month, do a whole body skin exam, using a mirror to look at the skin on your back. Tell your health care provider of new moles, moles that have irregular borders, moles that are larger than a pencil eraser, or moles that have changed in shape or color.

## 2017-11-27 NOTE — Telephone Encounter (Signed)
Will refill electronically  

## 2017-11-27 NOTE — Progress Notes (Signed)
Subjective:   [name] is a 67 y.o. female who presents for Medicare Annual (Subsequent) preventive examination.  Review of Systems:  N/A Cardiac Risk Factors include: advanced age (>80men, >49 women);obesity (BMI >30kg/m2);dyslipidemia;hypertension     Objective:     Vitals: BP 122/78 (BP Location: Right Arm, Patient Position: Sitting, Cuff Size: Normal)   Pulse 65   Temp 97.9 F (36.6 C) (Oral)   Ht 5' 6.75" (1.695 m) Comment: shoes  Wt 220 lb 8 oz (100 kg)   LMP  (LMP Unknown)   SpO2 96%   BMI 34.79 kg/m   Body mass index is 34.79 kg/m.  Advanced Directives 11/27/2017 11/26/2016 02/13/2016 01/19/2016 12/21/2015 11/22/2015 08/04/2015  Does Patient Have a Medical Advance Directive? Yes Yes No Yes Yes No Yes  Type of Paramedic of [place];Living will [place];Living will - - [place];Living will - -  Does patient want to make changes to medical advance directive? - - - - No - Patient declined - -  Copy of [place] in Chart? No - copy requested No - copy requested - Yes No - copy requested - No - copy requested  Would patient like information on creating a medical advance directive? - - No - patient declined information - - Yes Higher education careers adviser given -    Tobacco Social History   Tobacco Use  Smoking Status Former Smoker  . Packs/day: 1.00  . Years: 15.00  . Pack years: 15.00  . Types: Cigarettes  . Last attempt to quit: 08/20/1989  . Years since quitting: 28.2  Smokeless Tobacco Never Used  Tobacco Comment   Started at 82; less than 1 PPD     Counseling given: No Comment: Started at 89; less than 1 PPD   Clinical Intake:  Pre-visit preparation completed: Yes  Pain : 0-10 Pain Score: 3  Pain Type: Chronic pain Pain Location: Back Pain Orientation: Lower Pain Onset: More than a month ago Pain Frequency: Constant     Nutritional Status: BMI > 30  Obese Nutritional  Risks: None Diabetes: No  How often do you need to have someone help you when you read instructions, pamphlets, or other written materials from your doctor or pharmacy?: 1 - Never What is the last grade level you completed in school?: 12th grade  Interpreter Needed?: No  Comments: pt and granddaughter live together Information entered by :: LPinson, LPN  Past Medical History:  Diagnosis Date  . AICD (automatic cardioverter/defibrillator) present   . Anxiety   . Arthritis   . 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 Cerebral aneurysm, nonruptured   . Clostridium difficile infection   . Depressive disorder, not elsewhere classified   . Diverticulosis of colon (without mention of hemorrhage)   . Esophageal reflux   . Fibromyalgia   . Gastritis   . GERD (gastroesophageal reflux disease)   . GI bleed   . Hiatal hernia   . Hyperlipidemia   . Hypertension   . Hypothyroidism   . Internal hemorrhoids   . Obstructive sleep apnea (adult) (pediatric)   . Ostium secundum type atrial septal defect   . Other chronic nonalcoholic liver disease   . Other pulmonary embolism and infarction   . Paroxysmal ventricular tachycardia ([place])   . Personal history of radiation therapy   . Pneumonia    history  . Presence of permanent cardiac pacemaker   . PUD (peptic ulcer  disease)   . Radiation 02/03/15-03/10/15   Right Breast  . Sarcoid   . Schatzki's ring   . Stroke [facility]) 2013   tia  . Takotsubo syndrome   . Tubular adenoma of colon   . Unspecified transient cerebral ischemia   . Unspecified vitamin D deficiency    Past Surgical History:  Procedure Laterality Date  . ABDOMINAL HYSTERECTOMY    . ABI  2006   normal  . BREAST EXCISIONAL BIOPSY    . BREAST LUMPECTOMY     right breast 2016  . BREAST LUMPECTOMY WITH RADIOACTIVE SEED LOCALIZATION Right 01/03/2015   Procedure: BREAST LUMPECTOMY WITH RADIOACTIVE SEED LOCALIZATION;  Surgeon: Autumn Messing III, MD;  Location: [place];  Service:  General;  Laterality: Right;  . CARDIAC DEFIBRILLATOR PLACEMENT  2006; 2012   BSX single chamber ICD  . carotid dopplers  2006   neg  . CEREBRAL ANEURYSM REPAIR  02/1999  . hospitalization  2004   GI bleed, PUD, diverticulosis (EGD,colonscopy)  . hospitalization     PE, NSVT, s/p defib  . JOINT REPLACEMENT  2017   left knee   . KNEE ARTHROSCOPY     bilateral  . LEFT HEART CATHETERIZATION WITH CORONARY ANGIOGRAM N/A 02/22/2012   Procedure: LEFT HEART CATHETERIZATION WITH CORONARY ANGIOGRAM;  Surgeon: Burnell Blanks, MD;  Location: [facility] CATH LAB;  Service: Cardiovascular;  Laterality: N/A;  . PARTIAL HYSTERECTOMY     Fibroids  . TOTAL KNEE ARTHROPLASTY Left 02/13/2016   Procedure: TOTAL KNEE ARTHROPLASTY;  Surgeon: Vickey Huger, MD;  Location: [place];  Service: Orthopedics;  Laterality: Left;   Family History  Problem Relation Age of Onset  . Diabetes Mother   . Alzheimer's disease Mother   . Other Brother        Thyroid problem 10/2016  . Allergies Sister   . Heart disease Sister   . Heart disease Brother   . Prostate cancer Brother 38       same brother as throat cancer  . Throat cancer Brother        dx in his 55s; also a smoker  . Heart disease Father   . Cancer Maternal Grandmother 40       colon cancer or abdominal cancer  . Lung cancer Maternal Grandfather 8  . Breast cancer Maternal Aunt 50  . Colon cancer Maternal Aunt 33       same sister as breast at 2  . Lung cancer Maternal Uncle   . Breast cancer Maternal Aunt        dx in her 41s  . Pancreatic cancer Cousin 28       maternal first cousin  . Breast cancer Cousin        paternal first cousin twice removed died in her 40s  . Cardiomyopathy Unknown        Family history  . Heart disease Son        Cardiac Arrest 07/2016  . Stroke Neg Hx    Social History   Socioeconomic History  . Marital status: Divorced    Spouse name: Not on file  . Number of children: 2  . Years of education: Not on file  .  Highest education level: Not on file  Occupational History  . Occupation: DISABLED  Social Needs  . Financial resource strain: Not on file  . Food insecurity:    Worry: Not on file    Inability: Not on file  . Transportation needs:  Medical: Not on file    Non-medical: Not on file  Tobacco Use  . Smoking status: Former Smoker    Packs/day: 1.00    Years: 15.00    Pack years: 15.00    Types: Cigarettes    Last attempt to quit: 08/20/1989    Years since quitting: 28.2  . Smokeless tobacco: Never Used  . Tobacco comment: Started at 89; less than 1 PPD  Substance and Sexual Activity  . Alcohol use: Yes    Alcohol/week: 0.0 oz    Comment: rare  . Drug use: No  . Sexual activity: Not Currently  Lifestyle  . Physical activity:    Days per week: Not on file    Minutes per session: Not on file  . Stress: Not on file  Relationships  . Social connections:    Talks on phone: Not on file    Gets together: Not on file    Attends religious service: Not on file    Active member of club or organization: Not on file    Attends meetings of clubs or organizations: Not on file    Relationship status: Not on file  Other Topics Concern  . Not on file  Social History Narrative   Lives alone   Caffeine use: rare   Retired from [employer]   2 children   Divorced   Right handed     Outpatient Encounter Medications as of 11/27/2017  Medication Sig  . acetaminophen (TYLENOL) 500 MG tablet Take 500 mg by mouth every 6 (six) hours as needed (pain).   [name] albuterol (VENTOLIN HFA) 108 (90 Base) MCG/ACT inhaler Inhale 2 puffs into the lungs every 6 (six) hours as needed for wheezing or shortness of breath.  [name] aspirin 81 MG tablet Take 81 mg by mouth daily.  . baclofen (LIORESAL) 10 MG tablet TAKE 1 TABLET THREE TIMES DAILY  . calcium carbonate (OSCAL) 1500 (600 Ca) MG TABS tablet Take 600 mg of elemental calcium by mouth daily with breakfast.  . cholecalciferol (VITAMIN D) 1000 UNITS tablet Take  5,000 Units by mouth daily.   . fexofenadine (ALLEGRA) 180 MG tablet Take 180 mg by mouth daily as needed for allergies or rhinitis.  . fluticasone (FLONASE) 50 MCG/ACT nasal spray Place 2 sprays into both nostrils daily as needed for allergies or rhinitis.  [name] HYDROCHLOROTHIAZIDE PO Take by mouth.  . latanoprost (XALATAN) 0.005 % ophthalmic solution Place 1 drop into both eyes at bedtime.  [name] levothyroxine (SYNTHROID, LEVOTHROID) 50 MCG tablet Take 1 tablet (50 mcg total) by mouth daily.  . mometasone (ELOCON) 0.1 % lotion   . montelukast (SINGULAIR) 10 MG tablet Take 1 tablet (10 mg total) by mouth at bedtime.  . Multiple Vitamins-Minerals (MULTIVITAMIN PO) Take 1 tablet by mouth daily.  . pantoprazole (PROTONIX) 40 MG tablet TAKE 1 TABLET EVERY DAY  . polyethylene glycol powder (GLYCOLAX/MIRALAX) powder Take 17 grams by mouth daily  . potassium chloride (K-DUR) 10 MEQ tablet TAKE 1/2 TABLET (5MEQ TOTAL) EVERY DAY  . simvastatin (ZOCOR) 40 MG tablet TAKE 1/2 TABLET  (20  MG  TOTAL) EVERY DAY  . traMADol (ULTRAM) 50 MG tablet Take 1 tablet (50 mg total) by mouth at bedtime.  [name] zolpidem (AMBIEN) 10 MG tablet Take 1 tablet (10 mg total) by mouth at bedtime as needed. for sleep  . carvedilol (COREG) 12.5 MG tablet Take 1 tablet (12.5 mg total) by mouth 2 (two) times daily.  . furosemide (LASIX) 40 MG  tablet Take 1 tablet by mouth once daily as needed for increased swelling/ shortness of breath (Patient not taking: Reported on 11/27/2017)  . [DISCONTINUED] metoprolol succinate (TOPROL-XL) 50 MG 24 hr tablet Take 2 tablets (100 mg total) by mouth daily. Take with or immediately following a meal.   No facility-administered encounter medications on file as of 11/27/2017.     Activities of Daily Living In your present state of health, do you have any difficulty performing the following activities: 11/27/2017  Hearing? N  Vision? N  Difficulty concentrating or making decisions? N  Walking or climbing  stairs? Y  Comment SOB when climbing stairs   Dressing or bathing? N  Doing errands, shopping? N  Preparing Food and eating ? N  Using the Toilet? N  In the past six months, have you accidently leaked urine? Y  Comment wears pad daily  Do you have problems with loss of bowel control? N  Managing your Medications? N  Managing your Finances? N  Housekeeping or managing your Housekeeping? N  Some recent data might be hidden    Patient Care Team: Tower, [name], MD as PCP - General Jovita Kussmaul, MD as Consulting Physician (General Surgery) Truitt Merle, MD as Consulting Physician (Hematology) Thea Silversmith, MD (Inactive) as Consulting Physician (Radiation Oncology) Rockwell Germany, RN as Registered Nurse Mauro Kaufmann, RN as Registered Nurse Deboraha Sprang, MD as Consulting Physician (Cardiology) Holley Bouche, NP as Nurse Practitioner (Nurse Practitioner) Sylvan Cheese, NP as Nurse Practitioner (Nurse Practitioner) Chesley Mires, MD as Consulting Physician (Pulmonary Disease) Jovita Kussmaul, MD as Consulting Physician (General Surgery) Bo Merino, MD as Consulting Physician (Rheumatology)    Assessment:   This is a routine wellness examination for A[place].   Hearing Screening   125Hz  250Hz  500Hz  1000Hz  2000Hz  3000Hz  4000Hz  6000Hz  8000Hz   Right ear:   40 40 40  40    Left ear:   40 40 40  40    Vision Screening Comments: Last vision exam in Jan 2019 with Dr. Einar Gip  Exercise Activities and Dietary recommendations Current Exercise Habits: The patient does not participate in regular exercise at present, Exercise limited by: None identified  Goals    . Weight < 176 lb (79.833 kg)     Starting 11/27/2017, I will attempt to reduce all added sugar in diet and increase exercise for up to 90 min 3 days per week.        Fall Risk Fall Risk  11/27/2017 11/26/2016 11/22/2015 01/19/2015 06/25/2014  Falls in the past year? No No Yes Yes Yes  Number falls in past  yr: - - 1 1 1   Comment - - fell in yard; was running from a wasp; broke glasses; injury to head, shoulder, and hip - -  Injury with Fall? - - Yes No No  Risk for fall due to : - - Impaired balance/gait;Impaired mobility Other (Comment) -  Risk for fall due to: Comment - - - running fro wasp, tripped and fell in yard -  Follow up - - Falls evaluation completed;Education provided Falls prevention discussed -   Depression Screen PHQ 2/9 Scores 11/27/2017 11/26/2016 11/22/2015 01/19/2015  PHQ - 2 Score 0 0 0 0  PHQ- 9 Score 0 - - -     Cognitive Function MMSE - Mini Mental State Exam 11/27/2017 11/26/2016 11/22/2015  Orientation to time 5 5 5   Orientation to Place 5 5 5   Registration 3 3 3   Attention/  Calculation 0 0 0  Recall 3 3 3   Language- name 2 objects 0 0 0  Language- repeat 1 1 1   Language- follow 3 step command 3 3 3   Language- read & follow direction 0 0 0  Write a sentence 0 0 0  Copy design 0 0 0  Total score 20 20 20      PLEASE NOTE: A Mini-Cog screen was completed. Maximum score is 20. A value of 0 denotes this part of [place] MMSE was not completed or the patient failed this part of the Mini-Cog screening.   Mini-Cog Screening Orientation to Time - Max 5 pts Orientation to Place - Max 5 pts Registration - Max 3 pts Recall - Max 3 pts Language Repeat - Max 1 pts Language Follow 3 Step Command - Max 3 pts     Immunization History  Administered Date(s) Administered  . H1N1 07/24/2008  . Influenza Split 07/17/2011, 09/27/2011, 04/29/2012  . Influenza Whole 07/20/2004, 07/04/2007, 05/07/2008, 05/19/2009, 05/17/2010  . Influenza, High Dose Seasonal PF 05/30/2017  . Influenza,inj,Quad PF,6+ Mos 05/20/2013, 05/06/2014, 05/17/2015, 05/15/2016  . Pneumococcal Conjugate-13 11/22/2015  . Pneumococcal Polysaccharide-23 05/20/2002, 05/07/2008, 06/25/2014  . Td 09/07/2009  . Zoster 09/21/2011    Screening Tests Health Maintenance  Topic Date Due  . COLONOSCOPY  01/06/2018  (Originally 11/20/2017)  . MAMMOGRAM  01/04/2018  . INFLUENZA VACCINE  03/20/2018  . TETANUS/TDAP  09/08/2019  . DEXA SCAN  Completed  . Hepatitis C Screening  Completed  . PNA vac Low Risk Adult  Completed       Plan:     I have personally reviewed, addressed, and noted the following in the patient's chart:  A. Medical and social history B. Use of alcohol, tobacco or illicit drugs  C. Current medications and supplements D. Functional ability and status E.  Nutritional status F.  Physical activity G. Advance directives H. List of other physicians I.  Hospitalizations, surgeries, and ER visits in previous 12 months J.  [place] to include hearing, vision, cognitive, depression L. Referrals and appointments - none  In addition, I have reviewed and discussed with patient certain preventive protocols, quality metrics, and best practice recommendations. A written personalized care plan for preventive services as well as general preventive health recommendations were provided to patient.  See attached scanned questionnaire for additional information.   Signed,   Lindell Noe, MHA, BS, LPN Health Coach

## 2017-11-27 NOTE — Telephone Encounter (Signed)
Patient was in office today for AWV. Requested refill of Ambien. [facility] choice [facility] [address], [place].

## 2017-11-29 ENCOUNTER — Encounter: Payer: Self-pay | Admitting: Internal Medicine

## 2017-11-29 ENCOUNTER — Telehealth: Payer: Self-pay | Admitting: *Deleted

## 2017-11-29 NOTE — Telephone Encounter (Signed)
"  I have a procedure coming up with Dr. Paulla Dolly on April 23.  I'm going to have to cancel that procedure."

## 2017-12-03 ENCOUNTER — Ambulatory Visit (INDEPENDENT_AMBULATORY_CARE_PROVIDER_SITE_OTHER): Payer: Medicare Other | Admitting: Family Medicine

## 2017-12-03 ENCOUNTER — Encounter: Payer: Self-pay | Admitting: Family Medicine

## 2017-12-03 VITALS — BP 118/78 | HR 73 | Temp 97.6°F | Ht 66.75 in | Wt 221.2 lb

## 2017-12-03 DIAGNOSIS — E7800 Pure hypercholesterolemia, unspecified: Secondary | ICD-10-CM

## 2017-12-03 DIAGNOSIS — Z170 Estrogen receptor positive status [ER+]: Secondary | ICD-10-CM | POA: Diagnosis not present

## 2017-12-03 DIAGNOSIS — Z1211 Encounter for screening for malignant neoplasm of colon: Secondary | ICD-10-CM

## 2017-12-03 DIAGNOSIS — I10 Essential (primary) hypertension: Secondary | ICD-10-CM

## 2017-12-03 DIAGNOSIS — C50411 Malignant neoplasm of upper-outer quadrant of right female breast: Secondary | ICD-10-CM

## 2017-12-03 DIAGNOSIS — R739 Hyperglycemia, unspecified: Secondary | ICD-10-CM

## 2017-12-03 DIAGNOSIS — E039 Hypothyroidism, unspecified: Secondary | ICD-10-CM

## 2017-12-03 DIAGNOSIS — J479 Bronchiectasis, uncomplicated: Secondary | ICD-10-CM | POA: Diagnosis not present

## 2017-12-03 DIAGNOSIS — E559 Vitamin D deficiency, unspecified: Secondary | ICD-10-CM

## 2017-12-03 DIAGNOSIS — K760 Fatty (change of) liver, not elsewhere classified: Secondary | ICD-10-CM

## 2017-12-03 MED ORDER — POTASSIUM CHLORIDE ER 10 MEQ PO TBCR: EXTENDED_RELEASE_TABLET | ORAL | 3 refills | Status: DC

## 2017-12-03 MED ORDER — FLUTICASONE PROPIONATE 50 MCG/ACT NA SUSP: 2.0000 | Freq: Every day | NASAL | 3 refills | Status: DC | PRN

## 2017-12-03 MED ORDER — LEVOTHYROXINE SODIUM 50 MCG PO TABS: 50.0000 ug | ORAL_TABLET | Freq: Every day | ORAL | 3 refills | Status: DC

## 2017-12-03 NOTE — Patient Instructions (Addendum)
Continue the vitamin D daily indefinitely    If you are interested in the new shingles vaccine (Shingrix) - call your local pharmacy to check on coverage and availability  If interested - get on a waiting list at your pharmacy    Be mindful of sugar/carbs in diet  Try to get most of your carbohydrates from produce (with the exception of white potatoes)  Eat less bread/pasta/rice/snack foods/cereals/sweets and other items from the middle of the grocery store (processed carbs)     Follow up with pulmonary for breathing   You can try claritin for allergies instead of allegra

## 2017-12-03 NOTE — Progress Notes (Signed)
Subjective:    Patient ID: [name], female    DOB: [birth year], 67 y.o.   MRN: [id number]  HPI  Here for annual f/u of chronic health problems   Has been doing pretty good   More lung problems lately  Hx of bronchiectasis (feels like she does not move mucous when she coughs)  Feels like pnd Allegra and nasal spray/ clearing throat  Will f/u with pulmonary    Wt Readings from Last 3 Encounters:  12/03/17 221 lb 4 oz (100.4 kg)  11/27/17 220 lb 8 oz (100 kg)  10/25/17 221 lb 4 oz (100.4 kg)  stable  Not really eating very healthy- it is hard/ feels overwhelmed  Still has grand daughter living with her  Plans to start some exercise (has worked in yard) - just re joined [employer]  34.91 kg/m    Had amw on 4/10 Scheduled her colonoscopy and mammogram   Colonoscopy 4/14 with 5 y recall  Has it scheduled   Mammogram 5/18 -nl with diag mammo scheduled 5/21 Personal hx of breast cancer  Self breast exam - tissue tends to be thicker/sore in the bottom 1/2 (always)  Has done well with breast cancer recovery   dexa 6/13-had nl BMD No falls  No fractures  Vit d level is 46.9 (she takes vit daily)   zostavax 2/13 Interested in shingrix   bp is stable today  No cp or palpitations or headaches or edema  No side effects to medicines  BP Readings from Last 3 Encounters:  12/03/17 118/78  11/27/17 122/78  10/25/17 128/72     Hypothyroidism  Pt has no clinical changes No change in energy level/ hair or skin/ edema and no tremor Lab Results  Component Value Date   TSH 3.95 11/27/2017     Hyperglycemia Lab Results  Component Value Date   HGBA1C 5.6 11/27/2017  stable    Hyperlipidemia Lab Results  Component Value Date   CHOL 159 11/27/2017   CHOL 172 11/26/2016   CHOL 147 11/15/2015   Lab Results  Component Value Date   HDL 50.10 11/27/2017   HDL 48.50 11/26/2016   HDL 48.20 11/15/2015   Lab Results  Component Value Date   LDLCALC 84 11/27/2017   LDLCALC 96 11/26/2016   LDLCALC 82 11/15/2015   Lab Results  Component Value Date   TRIG 123.0 11/27/2017   TRIG 135.0 11/26/2016   TRIG 85.0 11/15/2015   Lab Results  Component Value Date   CHOLHDL 3 11/27/2017   CHOLHDL 4 11/26/2016   CHOLHDL 3 11/15/2015   Lab Results  Component Value Date   LDLDIRECT 141.8 07/04/2007  simvastatin and diet   ALT is mildly elevated  No alcohol   Patient Active Problem List   Diagnosis Date Noted  . Neck pain 10/25/2017  . Paresthesias in left hand 10/25/2017  . Paresthesia of foot, bilateral 04/12/2017  . Itching 02/22/2017  . Primary osteoarthritis of both hands 09/27/2016  . Primary osteoarthritis of both knees 09/27/2016  . Urinary frequency 09/26/2016  . Screen for STD (sexually transmitted disease) 09/26/2016  . Nasopharyngitis, 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/2016  . Postnasal drip 05/18/2016  . S/P total knee replacement 02/13/2016  . Chronic pain of both knees 12/06/2015  . Globus pharyngeus 05/17/2015  . Productive cough 05/17/2015  . Genetic testing 12/23/2014  . Family history of breast cancer   . Family history of colon cancer   . Family history of pancreatic cancer   .  Breast cancer of upper-outer quadrant of right female breast ([place]) 11/23/2014  . Allergy to ACE inhibitors 09/27/2014  . Encounter for Medicare annual wellness exam 06/27/2014  . Hyperglycemia 06/23/2013  . Routine general medical examination at a health care facility 06/23/2013  . Dizziness and giddiness 01/16/2013  . Disturbance of skin sensation 01/16/2013  . RLS (restless legs syndrome) 01/15/2013  . Hx of Clostridium difficile infection 10/10/2012  . Bronchiectasis without acute exacerbation ([place]) 04/29/2012  . Dyspnea on exertion 04/02/2012  . Non-ischemic cardiomyopathy ([place]) 03/07/2012  . Colon cancer screening 01/11/2012  . Post-menopausal 01/11/2012  . Chest pain 11/07/2011  . Palpitations 04/17/2011  . Abnormal tympanic membrane 01/22/2011  .  Obstructive sleep apnea 04/04/2010  . VENTRICULAR TACHYCARDIA 01/03/2009  . ICD  [employer]  Single chamber 01/03/2009  . PATENT FORAMEN OVALE 09/29/2008  . Vitamin D deficiency 08/17/2008  . Hypothyroidism 11/18/2006  . Hyperlipidemia 11/18/2006  . DEPRESSION 11/18/2006  . Essential hypertension 11/18/2006  . MITRAL VALVE PROLAPSE 11/18/2006  . CEREBRAL ANEURYSM 11/18/2006  . Allergic rhinitis 11/18/2006  . GERD 11/18/2006  . DIVERTICULOSIS, COLON 11/18/2006  . Fatty liver 11/18/2006  . Fibromyalgia 11/18/2006   Past Medical History:  Diagnosis Date  . AICD (automatic cardioverter/defibrillator) present   . Anxiety   . Arthritis   . 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 Cerebral aneurysm, nonruptured   . Clostridium difficile infection   . Depressive disorder, not elsewhere classified   . Diverticulosis of colon (without mention of hemorrhage)   . Esophageal reflux   . Fibromyalgia   . Gastritis   . GERD (gastroesophageal reflux disease)   . GI bleed   . Hiatal hernia   . Hyperlipidemia   . Hypertension   . Hypothyroidism   . Internal hemorrhoids   . Obstructive sleep apnea (adult) (pediatric)   . Ostium secundum type atrial septal defect   . Other chronic nonalcoholic liver disease   . Other pulmonary embolism and infarction   . Paroxysmal ventricular tachycardia ([place])   . Personal history of radiation therapy   . Pneumonia    history  . Presence of permanent cardiac pacemaker   . PUD (peptic ulcer disease)   . Radiation 02/03/15-03/10/15   Right Breast  . Sarcoid   . Schatzki's ring   . Stroke [facility]) 2013   tia  . Takotsubo syndrome   . Tubular adenoma of colon   . Unspecified transient cerebral ischemia   . Unspecified vitamin D deficiency    Past Surgical History:  Procedure Laterality Date  . ABDOMINAL HYSTERECTOMY    . ABI  2006   normal  . BREAST EXCISIONAL BIOPSY    . BREAST LUMPECTOMY     right breast 2016  . BREAST LUMPECTOMY WITH  RADIOACTIVE SEED LOCALIZATION Right 01/03/2015   Procedure: BREAST LUMPECTOMY WITH RADIOACTIVE SEED LOCALIZATION;  Surgeon: Autumn Messing III, MD;  Location: [facility];  Service: General;  Laterality: Right;  . CARDIAC DEFIBRILLATOR PLACEMENT  2006; 2012   BSX single chamber ICD  . carotid dopplers  2006   neg  . CEREBRAL ANEURYSM REPAIR  02/1999  . hospitalization  2004   GI bleed, PUD, diverticulosis (EGD,colonscopy)  . hospitalization     PE, NSVT, s/p defib  . JOINT REPLACEMENT  2017   left knee   . KNEE ARTHROSCOPY     bilateral  . LEFT HEART CATHETERIZATION WITH CORONARY ANGIOGRAM N/A 02/22/2012   Procedure: LEFT HEART CATHETERIZATION WITH CORONARY  ANGIOGRAM;  Surgeon: Burnell Blanks, MD;  Location: [facility] CATH LAB;  Service: Cardiovascular;  Laterality: N/A;  . PARTIAL HYSTERECTOMY     Fibroids  . TOTAL KNEE ARTHROPLASTY Left 02/13/2016   Procedure: TOTAL KNEE ARTHROPLASTY;  Surgeon: Vickey Huger, MD;  Location: [place];  Service: Orthopedics;  Laterality: Left;   Social History   Tobacco Use  . Smoking status: Former Smoker    Packs/day: 1.00    Years: 15.00    Pack years: 15.00    Types: Cigarettes    Last attempt to quit: 08/20/1989    Years since quitting: 28.3  . Smokeless tobacco: Never Used  . Tobacco comment: Started at 15; less than 1 PPD  Substance Use Topics  . Alcohol use: Yes    Alcohol/week: 0.0 oz    Comment: rare  . Drug use: No   Family History  Problem Relation Age of Onset  . Diabetes Mother   . Alzheimer's disease Mother   . Other Brother        Thyroid problem 10/2016  . Allergies Sister   . Heart disease Sister   . Heart disease Brother   . Prostate cancer Brother 4       same brother as throat cancer  . Throat cancer Brother        dx in his 57s; also a smoker  . Heart disease Father   . Cancer Maternal Grandmother 71       colon cancer or abdominal cancer  . Lung cancer Maternal Grandfather 4  . Breast cancer Maternal Aunt 39  . Colon cancer  Maternal Aunt 67       same sister as breast at 23  . Lung cancer Maternal Uncle   . Breast cancer Maternal Aunt        dx in her 29s  . Pancreatic cancer Cousin 60       maternal first cousin  . Breast cancer Cousin        paternal first cousin twice removed died in her 8s  . Cardiomyopathy Unknown        Family history  . Heart disease Son        Cardiac Arrest 07/2016  . Stroke Neg Hx    Allergies  Allergen Reactions  . Ace Inhibitors Swelling    Angioedema; makes tongue "break out" Angioedema; makes tongue "break out"  . Atorvastatin Other (See Comments)    SEVERE MYALGIAS, MUSCLE PAIN SEVERE MYALGIAS, MUSCLE PAIN  . Ramipril Other (See Comments)    TONGUE ULCERS TONGUE ULCERS  . Rosuvastatin Other (See Comments)    MYALGIAS, SEVERE MUSCLE PAIN MYALGIAS, SEVERE MUSCLE PAIN  . Amitriptyline Hcl Other (See Comments)    Unknown  . Amitriptyline Hcl Other (See Comments)    Unknown  . Iodine-131 Other (See Comments)  . Paroxetine Other (See Comments)    UNKNOWN UNKNOWN  . Carbamazepine Rash    REACTION: ? reaction REACTION: ? reaction  . Cetirizine Hcl Other (See Comments)    REACTION: sleepy  . Cetirizine Hcl Other (See Comments)    REACTION: sleepy  . Codeine Itching  . Dilantin [Phenytoin Sodium Extended] Rash  . Phenytoin Sodium Extended Rash   Current Outpatient Medications on File Prior to Visit  Medication Sig Dispense Refill  . acetaminophen (TYLENOL) 500 MG tablet Take 500 mg by mouth every 6 (six) hours as needed (pain).     [name] albuterol (VENTOLIN HFA) 108 (90 Base) MCG/ACT inhaler Inhale 2 puffs  into the lungs every 6 (six) hours as needed for wheezing or shortness of breath. 1 Inhaler 5  . aspirin 81 MG tablet Take 81 mg by mouth daily.    . baclofen (LIORESAL) 10 MG tablet TAKE 1 TABLET THREE TIMES DAILY 270 tablet 1  . calcium carbonate (OSCAL) 1500 (600 Ca) MG TABS tablet Take 600 mg of elemental calcium by mouth daily with breakfast.    .  cholecalciferol (VITAMIN D) 1000 UNITS tablet Take 5,000 Units by mouth daily.     . fexofenadine (ALLEGRA) 180 MG tablet Take 180 mg by mouth daily as needed for allergies or rhinitis.    . furosemide (LASIX) 40 MG tablet Take 1 tablet by mouth once daily as needed for increased swelling/ shortness of breath 90 tablet 3  . HYDROCHLOROTHIAZIDE PO Take by mouth.    . latanoprost (XALATAN) 0.005 % ophthalmic solution Place 1 drop into both eyes at bedtime.    . mometasone (ELOCON) 0.1 % lotion     . montelukast (SINGULAIR) 10 MG tablet Take 1 tablet (10 mg total) by mouth at bedtime. 90 tablet 3  . Multiple Vitamins-Minerals (MULTIVITAMIN PO) Take 1 tablet by mouth daily.    . NON FORMULARY HEMP/CBD OIL    . pantoprazole (PROTONIX) 40 MG tablet TAKE 1 TABLET EVERY DAY 90 tablet 1  . polyethylene glycol powder (GLYCOLAX/MIRALAX) powder Take 17 grams by mouth daily 1080 g 0  . simvastatin (ZOCOR) 40 MG tablet TAKE 1/2 TABLET  (20  MG  TOTAL) EVERY DAY 45 tablet 1  . traMADol (ULTRAM) 50 MG tablet Take 1 tablet (50 mg total) by mouth at bedtime. 30 tablet 1  . zolpidem (AMBIEN) 10 MG tablet Take 1 tablet (10 mg total) by mouth at bedtime as needed. for sleep 90 tablet 1  . carvedilol (COREG) 12.5 MG tablet Take 1 tablet (12.5 mg total) by mouth 2 (two) times daily. 180 tablet 3  . [DISCONTINUED] metoprolol succinate (TOPROL-XL) 50 MG 24 hr tablet Take 2 tablets (100 mg total) by mouth daily. Take with or immediately following a meal. 90 tablet 3   No current facility-administered medications on file prior to visit.     Review of Systems  Constitutional: Negative for activity change, appetite change, fatigue, fever and unexpected weight change.  HENT: Negative for congestion, ear pain, rhinorrhea, sinus pressure and sore throat.   Eyes: Negative for pain, redness and visual disturbance.  Respiratory: Positive for cough and shortness of breath. Negative for wheezing.   Cardiovascular: Negative for  chest pain and palpitations.  Gastrointestinal: Negative for abdominal pain, blood in stool, constipation and diarrhea.  Endocrine: Negative for polydipsia and polyuria.  Genitourinary: Negative for dysuria, frequency and urgency.  Musculoskeletal: Negative for arthralgias, back pain and myalgias.  Skin: Negative for pallor and rash.  Allergic/Immunologic: Negative for environmental allergies.  Neurological: Negative for dizziness, syncope and headaches.  Hematological: Negative for adenopathy. Does not bruise/bleed easily.  Psychiatric/Behavioral: Negative for decreased concentration and dysphoric mood. The patient is not nervous/anxious.        Objective:   Physical Exam  Constitutional: She appears well-developed and well-nourished. No distress.  obese and well appearing   HENT:  Head: Normocephalic and atraumatic.  Right Ear: External ear normal.  Left Ear: External ear normal.  Mouth/Throat: Oropharynx is clear and moist.  Eyes: Pupils are equal, round, and reactive to light. Conjunctivae and EOM are normal. No scleral icterus.  Neck: Normal range of motion. Neck supple. No  JVD present. Carotid bruit is not present. No thyromegaly present.  Cardiovascular: Normal rate, regular rhythm and intact distal pulses. Exam reveals no gallop.  Murmur heard. Pulmonary/Chest: Effort normal and breath sounds normal. No respiratory distress. She has no wheezes. She exhibits no tenderness. No breast swelling, tenderness, discharge or bleeding.  Abdominal: Soft. Bowel sounds are normal. She exhibits no distension, no abdominal bruit and no mass. There is no tenderness.  Genitourinary: No breast swelling, tenderness, discharge or bleeding.  Genitourinary Comments: Breast exam: No mass, nodules, thickening, tenderness, bulging, retraction, inflamation, nipple discharge or skin changes noted.  No axillary or clavicular LA.       Musculoskeletal: Normal range of motion. She exhibits no edema or  tenderness.  Lymphadenopathy:    She has no cervical adenopathy.  Neurological: She is alert. She has normal reflexes. No cranial nerve deficit. She exhibits normal muscle tone. Coordination normal.  Skin: Skin is warm and dry. No rash noted. No erythema. No pallor.  Some lentigines  Some skin tags  Psychiatric: She has a normal mood and affect.          Assessment & Plan:   Problem List Items Addressed This Visit      Cardiovascular and Mediastinum   Essential hypertension - Primary    bp in fair control at this time  BP Readings from Last 1 Encounters:  12/03/17 118/78   No changes needed Disc lifstyle change with low sodium diet and exercise  Labs rev        Respiratory   Bronchiectasis without acute exacerbation ([place])    Pt frustrated with symptoms For pulmonary f/u soon        Digestive   Fatty liver    Watching liver tests  ALT mild elevation  Enc low fat diet/wt loss        Endocrine   Hypothyroidism    Hypothyroidism  Pt has no clinical changes No change in energy level/ hair or skin/ edema and no tremor Lab Results  Component Value Date   TSH 3.95 11/27/2017          Relevant Medications   levothyroxine (SYNTHROID, LEVOTHROID) 50 MCG tablet     Other   Breast cancer of upper-outer quadrant of right female breast ([place])    Doing well with f/u  Mammogram planned in may  Enc self exams      Colon cancer screening    5 y recall colonoscopy is scheduled       Hyperglycemia    Lab Results  Component Value Date   HGBA1C 5.6 11/27/2017   Stable disc imp of low glycemic diet and wt loss to prevent DM2       Hyperlipidemia    Disc goals for lipids and reasons to control them Rev labs with pt Rev low sat fat diet in detail Controlled with simvastatin and diet       Vitamin D deficiency    Vitamin D level is therapeutic with current supplementation Disc importance of this to bone and overall health Level is 46.9

## 2017-12-04 ENCOUNTER — Ambulatory Visit (INDEPENDENT_AMBULATORY_CARE_PROVIDER_SITE_OTHER): Payer: Medicare Other | Admitting: *Deleted

## 2017-12-04 DIAGNOSIS — I472 Ventricular tachycardia, unspecified: Secondary | ICD-10-CM

## 2017-12-04 NOTE — Progress Notes (Signed)
Remote ICD transmission.   

## 2017-12-05 ENCOUNTER — Encounter: Payer: Self-pay | Admitting: Cardiology

## 2017-12-05 ENCOUNTER — Encounter: Payer: Self-pay | Admitting: Internal Medicine

## 2017-12-05 ENCOUNTER — Ambulatory Visit (INDEPENDENT_AMBULATORY_CARE_PROVIDER_SITE_OTHER): Payer: Medicare Other | Admitting: Internal Medicine

## 2017-12-05 VITALS — BP 110/70 | HR 69 | Ht 66.0 in | Wt 224.0 lb

## 2017-12-05 DIAGNOSIS — Z95810 Presence of automatic (implantable) cardiac defibrillator: Secondary | ICD-10-CM

## 2017-12-05 DIAGNOSIS — I428 Other cardiomyopathies: Secondary | ICD-10-CM | POA: Diagnosis not present

## 2017-12-05 DIAGNOSIS — I472 Ventricular tachycardia, unspecified: Secondary | ICD-10-CM

## 2017-12-05 LAB — CUP PACEART INCLINIC DEVICE CHECK
Implantable Lead Implant Date: 20050603
Implantable Lead Location: 753860
Implantable Lead Model: 185
Implantable Lead Serial Number: 116340
Implantable Pulse Generator Implant Date: 20110505
MDC IDC PG SERIAL: 266301
MDC IDC SESS DTM: 20190418164202

## 2017-12-05 MED ORDER — CARVEDILOL 12.5 MG PO TABS: 6.2500 mg | ORAL_TABLET | Freq: Two times a day (BID) | ORAL | 3 refills | Status: DC

## 2017-12-05 MED ORDER — HYDROCHLOROTHIAZIDE 12.5 MG PO TABS: 12.5000 mg | ORAL_TABLET | Freq: Every day | ORAL | 3 refills | Status: DC

## 2017-12-05 MED ORDER — SIMVASTATIN 40 MG PO TABS: ORAL_TABLET | ORAL | 3 refills | Status: DC

## 2017-12-05 NOTE — Progress Notes (Signed)
Patient Care Team: Tower, [name], MD as PCP - [place], [place], MD as Consulting Physician (General Surgery) Truitt Merle, MD as Consulting Physician (Hematology) Thea Silversmith, MD (Inactive) as Consulting Physician (Radiation Oncology) Rockwell Germany, RN as Registered Nurse Mauro Kaufmann, RN as Registered Nurse Deboraha Sprang, MD as Consulting Physician (Cardiology) Holley Bouche, NP as Nurse Practitioner (Nurse Practitioner) Sylvan Cheese, NP as Nurse Practitioner (Nurse Practitioner) Chesley Mires, MD as Consulting Physician (Pulmonary Disease) Jovita Kussmaul, MD as Consulting Physician (General Surgery) Bo Merino, MD as Consulting Physician (Rheumatology)   HPI  [name] is a 67 y.o. female Seen in followup for ICD implanted for secondary prevention with hx ventricular tachycardia in the context of sarcoid and nonobstructive coronary disease and Cardiomyopathy    She has some exercise fatigue with mild dyspnea; she has significant snoring and has a history of an abnormal sleep study;  She has not tolerated CPAP  2013 echocardiogram demonstrated moderate left ventricular dysfunction with EF of 35-40% new from September 2012;  LHC 7/13 no significant coronary disease and an LVEF of 45%  Echo 1/18  EF normal TR mild-mod DATE TEST EF   2013 Echo   35-40 %   1/18 Echo   55-65 %   9/18 Echo  55-65% E/E'           Date Cr K Hgb  7/18 0.82 4.1   13.2  4/19  0.78 3.9 12.9     She has shortness of breath but no chest pain   no edema  Overwhelmed in caring for her granddaughter  Doesn't want to take furosemide rathe HCTZ   Past Medical History:  Diagnosis Date  . AICD (automatic cardioverter/defibrillator) present   . Anxiety   . Arthritis   . Breast cancer ([place]) 2016   DCIS ER-/PR-  . Cerebral aneurysm, nonruptured   . Clostridium difficile infection   . Depressive disorder, not elsewhere classified   . Diverticulosis  of colon (without mention of hemorrhage)   . Esophageal reflux   . Fibromyalgia   . Gastritis   . GERD (gastroesophageal reflux disease)   . GI bleed   . Hiatal hernia   . Hyperlipidemia   . Hypertension   . Hypothyroidism   . Internal hemorrhoids   . Obstructive sleep apnea (adult) (pediatric)   . Ostium secundum type atrial septal defect   . Other chronic nonalcoholic liver disease   . Other pulmonary embolism and infarction   . Paroxysmal ventricular tachycardia ([place])   . Personal history of radiation therapy   . Pneumonia    history  . Presence of permanent cardiac pacemaker   . PUD (peptic ulcer disease)   . Radiation 02/03/15-03/10/15   Right Breast  . Sarcoid   . Schatzki's ring   . Stroke [facility]) 2013   tia  . Takotsubo syndrome   . Tubular adenoma of colon   . Unspecified transient cerebral ischemia   . Unspecified vitamin D deficiency     Past Surgical History:  Procedure Laterality Date  . ABDOMINAL HYSTERECTOMY    . ABI  2006   normal  . BREAST EXCISIONAL BIOPSY    . BREAST LUMPECTOMY     right breast 2016  . BREAST LUMPECTOMY WITH RADIOACTIVE SEED LOCALIZATION Right 01/03/2015   Procedure: BREAST LUMPECTOMY WITH RADIOACTIVE SEED LOCALIZATION;  Surgeon: Autumn Messing III, MD;  Location: [place];  Service: General;  Laterality: Right;  .  CARDIAC DEFIBRILLATOR PLACEMENT  2006; 2012   BSX single chamber ICD  . carotid dopplers  2006   neg  . CEREBRAL ANEURYSM REPAIR  02/1999  . hospitalization  2004   GI bleed, PUD, diverticulosis (EGD,colonscopy)  . hospitalization     PE, NSVT, s/p defib  . JOINT REPLACEMENT  2017   left knee   . KNEE ARTHROSCOPY     bilateral  . LEFT HEART CATHETERIZATION WITH CORONARY ANGIOGRAM N/A 02/22/2012   Procedure: LEFT HEART CATHETERIZATION WITH CORONARY ANGIOGRAM;  Surgeon: Burnell Blanks, MD;  Location[facility] CATH LAB;  Service: Cardiovascular;  Laterality: N/A;  . PARTIAL HYSTERECTOMY     Fibroids  . TOTAL KNEE ARTHROPLASTY  Left 02/13/2016   Procedure: TOTAL KNEE ARTHROPLASTY;  Surgeon: Vickey Huger, MD;  Location: [place];  Service: Orthopedics;  Laterality: Left;    Current Outpatient Medications  Medication Sig Dispense Refill  . acetaminophen (TYLENOL) 500 MG tablet Take 500 mg by mouth every 6 (six) hours as needed (pain).     [name] albuterol (VENTOLIN HFA) 108 (90 Base) MCG/ACT inhaler Inhale 2 puffs into the lungs every 6 (six) hours as needed for wheezing or shortness of breath. 1 Inhaler 5  . aspirin 81 MG tablet Take 81 mg by mouth daily.    . baclofen (LIORESAL) 10 MG tablet TAKE 1 TABLET THREE TIMES DAILY 270 tablet 1  . calcium carbonate (OSCAL) 1500 (600 Ca) MG TABS tablet Take 600 mg of elemental calcium by mouth daily with breakfast.    . cholecalciferol (VITAMIN D) 1000 UNITS tablet Take 5,000 Units by mouth daily.     . fexofenadine (ALLEGRA) 180 MG tablet Take 180 mg by mouth daily as needed for allergies or rhinitis.    . fluticasone (FLONASE) 50 MCG/ACT nasal spray Place 2 sprays into both nostrils daily as needed for allergies or rhinitis. 48 g 3  . hydrochlorothiazide (HYDRODIURIL) 12.5 MG tablet Take 1 tablet by mouth daily.     [name] latanoprost (XALATAN) 0.005 % ophthalmic solution Place 1 drop into both eyes at bedtime.    [name] levothyroxine (SYNTHROID, LEVOTHROID) 50 MCG tablet Take 1 tablet (50 mcg total) by mouth daily. 90 tablet 3  . mometasone (ELOCON) 0.1 % lotion     . montelukast (SINGULAIR) 10 MG tablet Take 1 tablet (10 mg total) by mouth at bedtime. 90 tablet 3  . Multiple Vitamins-Minerals (MULTIVITAMIN PO) Take 1 tablet by mouth daily.    . NON FORMULARY HEMP/CBD OIL    . pantoprazole (PROTONIX) 40 MG tablet TAKE 1 TABLET EVERY DAY 90 tablet 1  . polyethylene glycol powder (GLYCOLAX/MIRALAX) powder Take 17 grams by mouth daily 1080 g 0  . potassium chloride (K-DUR) 10 MEQ tablet TAKE 1/2 TABLET (5MEQ TOTAL) EVERY DAY 45 tablet 3  . simvastatin (ZOCOR) 40 MG tablet TAKE 1/2 TABLET  (20   MG  TOTAL) EVERY DAY 45 tablet 1  . traMADol (ULTRAM) 50 MG tablet Take 1 tablet (50 mg total) by mouth at bedtime. 30 tablet 1  . zolpidem (AMBIEN) 10 MG tablet Take 1 tablet (10 mg total) by mouth at bedtime as needed. for sleep 90 tablet 1  . carvedilol (COREG) 12.5 MG tablet Take 1 tablet (12.5 mg total) by mouth 2 (two) times daily. 180 tablet 3   No current facility-administered medications for this visit.     Allergies  Allergen Reactions  . Ace Inhibitors Swelling    Angioedema; makes tongue "break out" Angioedema; makes  tongue "break out"  . Atorvastatin Other (See Comments)    SEVERE MYALGIAS, MUSCLE PAIN SEVERE MYALGIAS, MUSCLE PAIN  . Ramipril Other (See Comments)    TONGUE ULCERS TONGUE ULCERS  . Rosuvastatin Other (See Comments)    MYALGIAS, SEVERE MUSCLE PAIN MYALGIAS, SEVERE MUSCLE PAIN  . Amitriptyline Hcl Other (See Comments)    Unknown  . Amitriptyline Hcl Other (See Comments)    Unknown  . Iodine-131 Other (See Comments)  . Paroxetine Other (See Comments)    UNKNOWN UNKNOWN  . Carbamazepine Rash    REACTION: ? reaction REACTION: ? reaction  . Cetirizine Hcl Other (See Comments)    REACTION: sleepy  . Cetirizine Hcl Other (See Comments)    REACTION: sleepy  . Codeine Itching  . Dilantin [Phenytoin Sodium Extended] Rash  . Phenytoin Sodium Extended Rash    Review of Systems negative except from HPI and PMH  Physical Exam BP 110/70   Pulse 69   Ht 5\' 6"  (1.676 m)   Wt 224 lb (101.6 kg)   LMP  (LMP Unknown)   SpO2 97%   BMI 36.15 kg/m  Well developed and nourished in no acute distress HENT normal Neck supple with JVP-6-7 Carotids brisk and full without bruits Clear Regular rate and rhythm, no murmurs or gallops Abd-soft with active BS without hepatomegaly No Clubbing cyanosis tredema Skin-warm and dry A & Oriented  Grossly normal sensory and motor function   ECG SR 72 19/08/37/ Assessment and  Plan  Nonischemic Cardiomyopathy  ?  sarcoid  CHF  Chronic mixed    Ventricular Tachycardia    Palpitations previously identified as PVCs  Sinus tachy  1AVB  Hypertension  Lightheadedness  Shortness of breath   Bronchiectasis   ICD [employer]    Psychosocial Stress    Infrequent nonsustained VT no sustained VT  Dyspnea remains an issue.  She is to follow-up with pulmonary regarding bronchiectasis  Lightheadedness may be associate with a relatively low blood pressure.  Will decrease her carvedilol from 12.5-6.25 twice daily.  She would like to change her diuretic from furosemide--hydrochlorothiazide.  We will do that.  Suggested restoration place.  pwp

## 2017-12-05 NOTE — Assessment & Plan Note (Signed)
Vitamin D level is therapeutic with current supplementation Disc importance of this to bone and overall health Level is 46.9

## 2017-12-05 NOTE — Assessment & Plan Note (Signed)
Hypothyroidism  Pt has no clinical changes No change in energy level/ hair or skin/ edema and no tremor Lab Results  Component Value Date   TSH 3.95 11/27/2017

## 2017-12-05 NOTE — Assessment & Plan Note (Signed)
bp in fair control at this time  BP Readings from Last 1 Encounters:  12/03/17 118/78   No changes needed Disc lifstyle change with low sodium diet and exercise  Labs rev

## 2017-12-05 NOTE — Assessment & Plan Note (Signed)
Pt frustrated with symptoms For pulmonary f/u soon

## 2017-12-05 NOTE — Assessment & Plan Note (Signed)
Disc goals for lipids and reasons to control them Rev labs with pt Rev low sat fat diet in detail Controlled with simvastatin and diet 

## 2017-12-05 NOTE — Patient Instructions (Signed)
Medication Instructions:   Your physician has recommended you make the following change in your medication:   1. Decrease you Carvedilol (Coreg) to 6.25mg , half tablet, two times daily 2. Begin HCTZ, 12.5mg , one tablet, per day  Labwork: None ordered.  Testing/Procedures: None ordered.  Follow-Up:  Your physician wants you to follow-up in: One Year with Caryl Comes. You will receive a reminder letter in the mail two months in advance. If you don't receive a letter, please call our office to schedule the follow-up appointment.   Remote monitoring is used to monitor your ICD from home. This monitoring reduces the number of office visits required to check your device to one time per year. It allows [place] to keep an eye on the functioning of your device to ensure it is working properly. You are scheduled for a device check from home on 03/05/2018. You may send your transmission at any time that day. If you have a wireless device, the transmission will be sent automatically. After your physician reviews your transmission, you will receive a postcard with your next transmission date.   Any Other Special Instructions Will Be Listed Below (If Applicable).     If you need a refill on your cardiac medications before your next appointment, please call your pharmacy.

## 2017-12-05 NOTE — Assessment & Plan Note (Signed)
Watching liver tests  ALT mild elevation  Enc low fat diet/wt loss

## 2017-12-05 NOTE — Assessment & Plan Note (Signed)
5 y recall colonoscopy is scheduled

## 2017-12-05 NOTE — Assessment & Plan Note (Signed)
Doing well with f/u  Mammogram planned in may  Enc self exams

## 2017-12-05 NOTE — Assessment & Plan Note (Signed)
Lab Results  Component Value Date   HGBA1C 5.6 11/27/2017   Stable disc imp of low glycemic diet and wt loss to prevent DM2

## 2017-12-10 ENCOUNTER — Encounter: Payer: Self-pay | Admitting: *Deleted

## 2017-12-11 LAB — CUP PACEART REMOTE DEVICE CHECK
Battery Remaining Percentage: 70 %
HighPow Impedance: 67 Ohm
Implantable Lead Implant Date: 20050603
Implantable Lead Model: 185
Implantable Lead Serial Number: 116340
Implantable Pulse Generator Implant Date: 20110505
Lead Channel Impedance Value: 643 Ohm
Lead Channel Pacing Threshold Amplitude: 0.8 V
Lead Channel Pacing Threshold Pulse Width: 0.4 ms
Lead Channel Setting Sensing Sensitivity: 0.4 mV
MDC IDC LEAD LOCATION: 753860
MDC IDC MSMT BATTERY REMAINING LONGEVITY: 66 mo
MDC IDC SESS DTM: 20190417070100
MDC IDC SET LEADCHNL RV PACING AMPLITUDE: 2.4 V
MDC IDC SET LEADCHNL RV PACING PULSEWIDTH: 0.4 ms
MDC IDC STAT BRADY RV PERCENT PACED: 0 %
Pulse Gen Serial Number: 266301

## 2017-12-12 HISTORY — PX: TONGUE BIOPSY: SHX1075

## 2017-12-17 ENCOUNTER — Encounter: Payer: Self-pay | Admitting: Family Medicine

## 2017-12-17 ENCOUNTER — Ambulatory Visit (INDEPENDENT_AMBULATORY_CARE_PROVIDER_SITE_OTHER): Payer: Medicare Other | Admitting: Family Medicine

## 2017-12-17 VITALS — BP 110/72 | HR 78 | Temp 98.1°F | Ht 66.0 in | Wt 218.0 lb

## 2017-12-17 DIAGNOSIS — F329 Major depressive disorder, single episode, unspecified: Secondary | ICD-10-CM

## 2017-12-17 DIAGNOSIS — W57XXXA Bitten or stung by nonvenomous insect and other nonvenomous arthropods, initial encounter: Secondary | ICD-10-CM | POA: Diagnosis not present

## 2017-12-17 DIAGNOSIS — R4589 Other symptoms and signs involving emotional state: Secondary | ICD-10-CM

## 2017-12-17 DIAGNOSIS — S40862A Insect bite (nonvenomous) of left upper arm, initial encounter: Secondary | ICD-10-CM | POA: Diagnosis not present

## 2017-12-17 MED ORDER — BUPROPION HCL ER (XL) 150 MG PO TB24: 150.0000 mg | ORAL_TABLET | Freq: Every day | ORAL | 11 refills | Status: DC

## 2017-12-17 NOTE — Assessment & Plan Note (Signed)
2 cm of induration and erythema on L upper arm -with 2 smaller areas of erythema on neck and wrist  No vesicles (but disc watching for shingles) Mometasone cream daily with cool compresses prn  Update if not starting to improve in a week or if worsening

## 2017-12-17 NOTE — Assessment & Plan Note (Signed)
Long discussion about fatigue/lack of motivation and depression  Reviewed stressors/ coping techniques/symptoms/ support sources/ tx options and side effects in detail today In a very tough social situation caring for dev challenged grand daughter who parents refuse to care for  Will f/u with counseling as planned by Dr Caryl Comes  Px wellbutrin xl 150 mg daily  Discussed expectations of this medication including time to effectiveness and mechanism of action, also poss of side effects (early and late)- including mental fuzziness, weight or appetite change, nausea and poss of worse dep or anxiety (even suicidal thoughts)  Pt voiced understanding and will stop med and update if this occurs   F/u in 4-6 weeks  >25 minutes spent in face to face time with patient, >50% spent in counselling or coordination of care (including importance of self care)

## 2017-12-17 NOTE — Progress Notes (Signed)
Subjective:    Patient ID: [name], female    DOB: Dec 07, 1950, 67 y.o.   MRN: [id number]  HPI  Here for symptoms of depression and also bug bite L arm  She was working in the yard -- L arm Marcelina Morel L neck  Felt it when it happened - was inside her shirt  Wondered if it was an ant  Did not see a tick  Spot on arm is red and itchy and some pain - put some neosporin and took a shower  No fever  Feels ok    Wt Readings from Last 3 Encounters:  12/17/17 218 lb (98.9 kg)  12/05/17 224 lb (101.6 kg)  12/03/17 221 lb 4 oz (100.4 kg)   35.19 kg/m   Depressed mood - 2 months  Feels uncomfortable in the pit of her stomach-worse in the am  Makes her not want to get up  When she gets up-does not want to do anything  Has to push herself to get through the day   Was on paxil in the past-made her nauseated and dizzy   She has an appt with a counselor (Dr Caryl Comes referred her) on the 15th   Stressors- grand daughter's care / has to take her to doctors and mental health  67 yo - risk taking/ has had STDs The parents are not doing anything or paying attention to her  Also on the autism spectrum  A very sad situation and she is working hard  Is irritable    Patient Active Problem List   Diagnosis Date Noted  . Insect bite of left arm 12/17/2017  . Neck pain 10/25/2017  . Paresthesias in left hand 10/25/2017  . Paresthesia of foot, bilateral 04/12/2017  . Itching 02/22/2017  . Primary osteoarthritis of both hands 09/27/2016  . Primary osteoarthritis of both knees 09/27/2016  . Urinary frequency 09/26/2016  . Screen for STD (sexually transmitted disease) 09/26/2016  . Nasopharyngitis, 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/2016  . Postnasal drip 05/18/2016  . S/P total knee replacement 02/13/2016  . Chronic pain of both knees 12/06/2015  . Globus pharyngeus 05/17/2015  . Productive cough 05/17/2015  . Genetic testing 12/23/2014  . Family history of breast cancer   . Family history of colon cancer     . Family history of pancreatic cancer   . Breast cancer of upper-outer quadrant of right female breast ([place]) 11/23/2014  . Allergy to ACE inhibitors 09/27/2014  . Encounter for Medicare annual wellness exam 06/27/2014  . Hyperglycemia 06/23/2013  . Routine general medical examination at a health care facility 06/23/2013  . Dizziness and giddiness 01/16/2013  . Disturbance of skin sensation 01/16/2013  . RLS (restless legs syndrome) 01/15/2013  . Hx of Clostridium difficile infection 10/10/2012  . Bronchiectasis without acute exacerbation ([place]) 04/29/2012  . Dyspnea on exertion 04/02/2012  . Non-ischemic cardiomyopathy ([place]) 03/07/2012  . Colon cancer screening 01/11/2012  . Post-menopausal 01/11/2012  . Chest pain 11/07/2011  . Palpitations 04/17/2011  . Abnormal tympanic membrane 01/22/2011  . Obstructive sleep apnea 04/04/2010  . VENTRICULAR TACHYCARDIA 01/03/2009  . ICD  [employer]  Single chamber 01/03/2009  . PATENT FORAMEN OVALE 09/29/2008  . Vitamin D deficiency 08/17/2008  . Hypothyroidism 11/18/2006  . Hyperlipidemia 11/18/2006  . Depressed mood 11/18/2006  . Essential hypertension 11/18/2006  . MITRAL VALVE PROLAPSE 11/18/2006  . CEREBRAL ANEURYSM 11/18/2006  . Allergic rhinitis 11/18/2006  . GERD 11/18/2006  . DIVERTICULOSIS, COLON 11/18/2006  . Fatty liver 11/18/2006  .  Fibromyalgia 11/18/2006   Past Medical History:  Diagnosis Date  . AICD (automatic cardioverter/defibrillator) present   . Anxiety   . Arthritis   . 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 Cerebral aneurysm, nonruptured   . Clostridium difficile infection   . Depressive disorder, not elsewhere classified   . Diverticulosis of colon (without mention of hemorrhage)   . Esophageal reflux   . Fibromyalgia   . Gastritis   . GERD (gastroesophageal reflux disease)   . GI bleed   . Hiatal hernia   . Hyperlipidemia   . Hypertension   . Hypothyroidism   . Internal hemorrhoids   .  Obstructive sleep apnea (adult) (pediatric)   . Ostium secundum type atrial septal defect   . Other chronic nonalcoholic liver disease   . Other pulmonary embolism and infarction   . Paroxysmal ventricular tachycardia ([place])   . Personal history of radiation therapy   . Pneumonia    history  . Presence of permanent cardiac pacemaker   . PUD (peptic ulcer disease)   . Radiation 02/03/15-03/10/15   Right Breast  . Sarcoid   . Schatzki's ring   . Stroke [facility]) 2013   tia  . Takotsubo syndrome   . Tubular adenoma of colon   . Unspecified transient cerebral ischemia   . Unspecified vitamin D deficiency    Past Surgical History:  Procedure Laterality Date  . ABDOMINAL HYSTERECTOMY    . ABI  2006   normal  . BREAST EXCISIONAL BIOPSY    . BREAST LUMPECTOMY     right breast 2016  . BREAST LUMPECTOMY WITH RADIOACTIVE SEED LOCALIZATION Right 01/03/2015   Procedure: BREAST LUMPECTOMY WITH RADIOACTIVE SEED LOCALIZATION;  Surgeon: Autumn Messing III, MD;  Location: [facility];  Service: General;  Laterality: Right;  . CARDIAC DEFIBRILLATOR PLACEMENT  2006; 2012   BSX single chamber ICD  . carotid dopplers  2006   neg  . CEREBRAL ANEURYSM REPAIR  02/1999  . hospitalization  2004   GI bleed, PUD, diverticulosis (EGD,colonscopy)  . hospitalization     PE, NSVT, s/p defib  . JOINT REPLACEMENT  2017   left knee   . KNEE ARTHROSCOPY     bilateral  . LEFT HEART CATHETERIZATION WITH CORONARY ANGIOGRAM N/A 02/22/2012   Procedure: LEFT HEART CATHETERIZATION WITH CORONARY ANGIOGRAM;  Surgeon: Burnell Blanks, MD;  Location[facility] CATH LAB;  Service: Cardiovascular;  Laterality: N/A;  . PARTIAL HYSTERECTOMY     Fibroids  . TOTAL KNEE ARTHROPLASTY Left 02/13/2016   Procedure: TOTAL KNEE ARTHROPLASTY;  Surgeon: Vickey Huger, MD;  Location: [facility];  Service: Orthopedics;  Laterality: Left;   Social History   Tobacco Use  . Smoking status: Former Smoker    Packs/day: 1.00    Years: 15.00    Pack years:  15.00    Types: Cigarettes    Last attempt to quit: 08/20/1989    Years since quitting: 28.3  . Smokeless tobacco: Never Used  . Tobacco comment: Started at 74; less than 1 PPD  Substance Use Topics  . Alcohol use: Yes    Alcohol/week: 0.0 oz    Comment: rare  . Drug use: No   Family History  Problem Relation Age of Onset  . Diabetes Mother   . Alzheimer's disease Mother   . Other Brother        Thyroid problem 10/2016  . Allergies Sister   . Heart disease Sister   . Heart disease Brother   .  Prostate cancer Brother 77       same brother as throat cancer  . Throat cancer Brother        dx in his 63s; also a smoker  . Heart disease Father   . Cancer Maternal Grandmother 77       colon cancer or abdominal cancer  . Lung cancer Maternal Grandfather 85  . Breast cancer Maternal Aunt 31  . Colon cancer Maternal Aunt 37       same sister as breast at 81  . Lung cancer Maternal Uncle   . Breast cancer Maternal Aunt        dx in her 87s  . Pancreatic cancer Cousin 29       maternal first cousin  . Breast cancer Cousin        paternal first cousin twice removed died in her 58s  . Cardiomyopathy Unknown        Family history  . Heart disease Son        Cardiac Arrest 07/2016  . Stroke Neg Hx    Allergies  Allergen Reactions  . Ace Inhibitors Swelling    Angioedema; makes tongue "break out" Angioedema; makes tongue "break out"  . Atorvastatin Other (See Comments)    SEVERE MYALGIAS, MUSCLE PAIN SEVERE MYALGIAS, MUSCLE PAIN  . Ramipril Other (See Comments)    TONGUE ULCERS TONGUE ULCERS  . Rosuvastatin Other (See Comments)    MYALGIAS, SEVERE MUSCLE PAIN MYALGIAS, SEVERE MUSCLE PAIN  . Amitriptyline Hcl Other (See Comments)    Unknown  . Amitriptyline Hcl Other (See Comments)    Unknown  . Iodine-131 Other (See Comments)  . Paroxetine Other (See Comments)    UNKNOWN UNKNOWN  . Carbamazepine Rash    REACTION: ? reaction REACTION: ? reaction  . Cetirizine Hcl  Other (See Comments)    REACTION: sleepy  . Cetirizine Hcl Other (See Comments)    REACTION: sleepy  . Codeine Itching  . Dilantin [Phenytoin Sodium Extended] Rash  . Phenytoin Sodium Extended Rash   Current Outpatient Medications on File Prior to Visit  Medication Sig Dispense Refill  . acetaminophen (TYLENOL) 500 MG tablet Take 500 mg by mouth every 6 (six) hours as needed (pain).     [name] albuterol (VENTOLIN HFA) 108 (90 Base) MCG/ACT inhaler Inhale 2 puffs into the lungs every 6 (six) hours as needed for wheezing or shortness of breath. 1 Inhaler 5  . aspirin 81 MG tablet Take 81 mg by mouth daily.    . baclofen (LIORESAL) 10 MG tablet TAKE 1 TABLET THREE TIMES DAILY 270 tablet 1  . calcium carbonate (OSCAL) 1500 (600 Ca) MG TABS tablet Take 600 mg of elemental calcium by mouth daily with breakfast.    . carvedilol (COREG) 12.5 MG tablet Take 0.5 tablets (6.25 mg total) by mouth 2 (two) times daily. 90 tablet 3  . cholecalciferol (VITAMIN D) 1000 UNITS tablet Take 5,000 Units by mouth daily.     . fexofenadine (ALLEGRA) 180 MG tablet Take 180 mg by mouth daily as needed for allergies or rhinitis.    . fluticasone (FLONASE) 50 MCG/ACT nasal spray Place 2 sprays into both nostrils daily as needed for allergies or rhinitis. 48 g 3  . hydrochlorothiazide (HYDRODIURIL) 12.5 MG tablet Take 1 tablet (12.5 mg total) by mouth daily. 90 tablet 3  . latanoprost (XALATAN) 0.005 % ophthalmic solution Place 1 drop into both eyes at bedtime.    [name] levothyroxine (SYNTHROID, LEVOTHROID) 50 MCG tablet  Take 1 tablet (50 mcg total) by mouth daily. 90 tablet 3  . mometasone (ELOCON) 0.1 % lotion     . montelukast (SINGULAIR) 10 MG tablet Take 1 tablet (10 mg total) by mouth at bedtime. 90 tablet 3  . Multiple Vitamins-Minerals (MULTIVITAMIN PO) Take 1 tablet by mouth daily.    . NON FORMULARY HEMP/CBD OIL    . pantoprazole (PROTONIX) 40 MG tablet TAKE 1 TABLET EVERY DAY 90 tablet 1  . polyethylene glycol  powder (GLYCOLAX/MIRALAX) powder Take 17 grams by mouth daily 1080 g 0  . potassium chloride (K-DUR) 10 MEQ tablet TAKE 1/2 TABLET (5MEQ TOTAL) EVERY DAY 45 tablet 3  . simvastatin (ZOCOR) 40 MG tablet TAKE 1/2 TABLET  (20  MG  TOTAL) EVERY DAY 45 tablet 3  . traMADol (ULTRAM) 50 MG tablet Take 1 tablet (50 mg total) by mouth at bedtime. 30 tablet 1  . zolpidem (AMBIEN) 10 MG tablet Take 1 tablet (10 mg total) by mouth at bedtime as needed. for sleep 90 tablet 1  . [DISCONTINUED] metoprolol succinate (TOPROL-XL) 50 MG 24 hr tablet Take 2 tablets (100 mg total) by mouth daily. Take with or immediately following a meal. 90 tablet 3   No current facility-administered medications on file prior to visit.     Review of Systems  Constitutional: Positive for fatigue. Negative for activity change, appetite change, fever and unexpected weight change.  HENT: Negative for congestion, ear pain, rhinorrhea, sinus pressure and sore throat.   Eyes: Negative for pain, redness and visual disturbance.  Respiratory: Negative for cough, shortness of breath and wheezing.   Cardiovascular: Negative for chest pain and palpitations.  Gastrointestinal: Negative for abdominal pain, blood in stool, constipation and diarrhea.  Endocrine: Negative for polydipsia and polyuria.  Genitourinary: Negative for dysuria, frequency and urgency.  Musculoskeletal: Negative for arthralgias, back pain and myalgias.  Skin: Positive for color change. Negative for pallor and rash.       Insect bite   Allergic/Immunologic: Negative for environmental allergies.  Neurological: Negative for dizziness, syncope and headaches.  Hematological: Negative for adenopathy. Does not bruise/bleed easily.  Psychiatric/Behavioral: Positive for decreased concentration and dysphoric mood. Negative for self-injury and suicidal ideas. The patient is nervous/anxious.        Objective:   Physical Exam  Constitutional: She appears well-developed and  well-nourished. No distress.  obese and well appearing   HENT:  Head: Normocephalic and atraumatic.  Mouth/Throat: Oropharynx is clear and moist.  Eyes: Pupils are equal, round, and reactive to light. Conjunctivae and EOM are normal.  Neck: Normal range of motion. Neck supple. No JVD present. Carotid bruit is not present. No thyromegaly present.  Cardiovascular: Normal rate, regular rhythm, normal heart sounds and intact distal pulses. Exam reveals no gallop.  Pulmonary/Chest: Effort normal and breath sounds normal. No respiratory distress. She has no wheezes. She has no rales.  No crackles  Abdominal: She exhibits no abdominal bruit.  Musculoskeletal: She exhibits no edema.  Lymphadenopathy:    She has no cervical adenopathy.  Neurological: She is alert. She has normal reflexes.  Skin: Skin is warm and dry. No rash noted.  2 cm oval area of erythema and induration on upper arm with tiny scab  2 smaller areas of erythema on wrist and neck  No vesicles noted No ticks noted   Psychiatric: Her mood appears anxious. Her affect is not blunt and not labile. She is not agitated. Thought content is not paranoid. Cognition and memory are  normal. She exhibits a depressed mood. She expresses no homicidal and no suicidal ideation.  Seems generally down  Talks candidly about stressors           Assessment & Plan:   Problem List Items Addressed This Visit      Musculoskeletal and Integument   Insect bite of left arm    2 cm of induration and erythema on L upper arm -with 2 smaller areas of erythema on neck and wrist  No vesicles (but disc watching for shingles) Mometasone cream daily with cool compresses prn  Update if not starting to improve in a week or if worsening          Other   Depressed mood - Primary    Long discussion about fatigue/lack of motivation and depression  Reviewed stressors/ coping techniques/symptoms/ support sources/ tx options and side effects in detail  today In a very tough social situation caring for dev challenged grand daughter who parents refuse to care for  Will f/u with counseling as planned by Dr Caryl Comes  Px wellbutrin xl 150 mg daily  Discussed expectations of this medication including time to effectiveness and mechanism of action, also poss of side effects (early and late)- including mental fuzziness, weight or appetite change, nausea and poss of worse dep or anxiety (even suicidal thoughts)  Pt voiced understanding and will stop med and update if this occurs   F/u in 4-6 weeks  >25 minutes spent in face to face time with patient, >50% spent in counselling or coordination of care (including importance of self care)

## 2017-12-17 NOTE — Patient Instructions (Signed)
Use a steroid cream on the bites (mometasone or triamcinolone are fine) - call us if you need something called in   Cool compress is a good idea  Clean with soap and water  Watch for new areas - if you break out more please let me know   Keep your counseling appointment  I want to try you on wellbutrin for depression One pill a day - 150 mg in am  If any side effects or if you feel worse - stop it and let us know   Follow up in about 4-6 weeks

## 2017-12-18 ENCOUNTER — Other Ambulatory Visit: Payer: Medicare Other

## 2017-12-19 ENCOUNTER — Telehealth: Payer: Self-pay

## 2017-12-19 NOTE — Telephone Encounter (Signed)
Pt seen 12/17/17.

## 2017-12-19 NOTE — Telephone Encounter (Signed)
It has less side effects than most anti depressants  What was she most concerned about ?

## 2017-12-19 NOTE — Telephone Encounter (Signed)
Copied from [place] [id number]. Topic: Inquiry >> Dec 19, 2017  2:42 PM Pricilla Handler wrote: Reason for CRM: Patient has requested if Dr. Glori Bickers would prescribe a different medication other than buPROPion (WELLBUTRIN XL) 150 MG 24 hr tablet. Patient is concerned about the many side effects this medication can cause. Please call patient at [phone].          Thank You!!!

## 2017-12-20 NOTE — Telephone Encounter (Signed)
Spoke to pt who states she is in a meeting right now and "cannot remember exactly whats on the list." But recalls seeing dizziness, which she states she is already experiencing, and is not wanting it to be increased with taking the med

## 2017-12-20 NOTE — Telephone Encounter (Signed)
Ok - let me know if she changes her mind

## 2017-12-26 ENCOUNTER — Ambulatory Visit (INDEPENDENT_AMBULATORY_CARE_PROVIDER_SITE_OTHER): Payer: Medicare Other | Admitting: Podiatry

## 2017-12-26 ENCOUNTER — Encounter: Payer: Self-pay | Admitting: Podiatry

## 2017-12-26 ENCOUNTER — Ambulatory Visit (AMBULATORY_SURGERY_CENTER): Payer: Self-pay

## 2017-12-26 VITALS — Ht 66.5 in | Wt 216.4 lb

## 2017-12-26 DIAGNOSIS — L84 Corns and callosities: Secondary | ICD-10-CM | POA: Diagnosis not present

## 2017-12-26 DIAGNOSIS — M2041 Other hammer toe(s) (acquired), right foot: Secondary | ICD-10-CM

## 2017-12-26 DIAGNOSIS — M2042 Other hammer toe(s) (acquired), left foot: Secondary | ICD-10-CM | POA: Diagnosis not present

## 2017-12-26 DIAGNOSIS — Z86010 Personal history of colonic polyps: Secondary | ICD-10-CM

## 2017-12-26 MED ORDER — NA SULFATE-K SULFATE-MG SULF 17.5-3.13-1.6 GM/177ML PO SOLN: 1.0000 | Freq: Once | ORAL | 0 refills | Status: AC

## 2017-12-26 NOTE — Progress Notes (Signed)
Per pt, no allergies to soy or egg products.Pt not taking any weight loss meds or using  O2 at home.  Pt refused emmi video. 

## 2017-12-26 NOTE — Progress Notes (Signed)
Subjective:   Patient ID: [name], female   DOB: 67 y.o.   MRN: [id number]   HPI Patient presents stating her doctor does not want her to have surgery currently with the thought it would be under general anesthetic which I explained it would not.  Is complaining of very painful calluses the ends of the third toes of both feet   ROS      Objective:  Physical Exam  Neurovascular status intact with significant digital deformities digit 3 bilateral with distal keratotic lesions that are very painful with the length of the toe and distal rotation complicating factors     Assessment:  Chronic hammertoe deformity with keratotic lesion     Plan:  Sterile debridement accomplished today with buttress pads to lift the toes and discussed ultimately this will probably require surgery but could be done under either local anesthetic or under very light anesthetic.  Patient will see how well she does with training will make decision long-term

## 2018-01-02 ENCOUNTER — Telehealth: Payer: Self-pay | Admitting: Neurology

## 2018-01-02 ENCOUNTER — Other Ambulatory Visit: Payer: Self-pay

## 2018-01-02 ENCOUNTER — Ambulatory Visit (INDEPENDENT_AMBULATORY_CARE_PROVIDER_SITE_OTHER): Payer: Medicare Other | Admitting: Neurology

## 2018-01-02 ENCOUNTER — Encounter: Payer: Self-pay | Admitting: Neurology

## 2018-01-02 VITALS — BP 142/78 | HR 68 | Ht 66.5 in | Wt 217.5 lb

## 2018-01-02 DIAGNOSIS — M79602 Pain in left arm: Secondary | ICD-10-CM

## 2018-01-02 MED ORDER — GABAPENTIN 300 MG PO CAPS: 300.0000 mg | ORAL_CAPSULE | Freq: Two times a day (BID) | ORAL | 3 refills | Status: DC

## 2018-01-02 NOTE — Telephone Encounter (Signed)
Medicare/aarp order sent to GI. No auth they will reach out to the pt to schedule.  °

## 2018-01-02 NOTE — Progress Notes (Signed)
Office Visit Note  Patient: [name]             Date of Birth: 30-Dec-1950           MRN: [id number]             PCP: Abner Greenspan, MD Referring: Tower, [name], MD Visit Date: 01/16/2018 Occupation: @GUAROCC @    Subjective:  Neck pain    History of Present Illness: [name] is a 67 y.o. female with history of fibromyalgia and osteoarthritis.  Patient states her fibromyalgia has been well controlled.  She reports she has been trying to stay active.  She continues to take Baclofen PRN, tramadol 50 mg by mouth once or twice a week PRN for pain relief.  She states she takes Ambien 10 mg at bedtime to help manage insomnia. Her main concern today is neck pain and stiffness.  She recently saw her neurologist who will be ordered a CT due to her not being able to have MRIs.  She states she is also having pain radiate down her right leg.  She states she is scheduled for a right knee replacement in August with Dr. Lorre Nick.  Her left knee replacement is doing well.  She states about 2 weeks ago Dr. Lorre Nick performed a right knee cortisone injection.      Activities of Daily Living:  Patient reports morning stiffness for 5-10  minutes.   Patient Reports nocturnal pain.  Difficulty dressing/grooming: Denies Difficulty climbing stairs: Reports Difficulty getting out of chair: Reports Difficulty using hands for taps, buttons, cutlery, and/or writing: Denies   Review of Systems  Constitutional: Negative for fatigue.  HENT: Positive for mouth sores and mouth dryness. Negative for nose dryness.   Eyes: Positive for dryness. Negative for pain and visual disturbance.  Respiratory: Negative for cough, hemoptysis, shortness of breath and difficulty breathing.   Cardiovascular: Negative for chest pain, palpitations, hypertension and swelling in legs/feet.  Gastrointestinal: Negative for blood in stool, constipation and diarrhea.  Endocrine: Negative for increased urination.  Genitourinary:  Negative for painful urination.  Musculoskeletal: Positive for arthralgias, joint pain, myalgias, morning stiffness, muscle tenderness and myalgias. Negative for joint swelling and muscle weakness.  Skin: Negative for color change, pallor, rash, hair loss, nodules/bumps, skin tightness, ulcers and sensitivity to sunlight.  Allergic/Immunologic: Negative for susceptible to infections.  Neurological: Negative for dizziness, numbness, headaches and weakness.  Hematological: Negative for swollen glands.  Psychiatric/Behavioral: Positive for depressed mood and sleep disturbance. The patient is not nervous/anxious.     PMFS History:  Patient Active Problem List   Diagnosis Date Noted  . Insect bite of left arm 12/17/2017  . Neck pain 10/25/2017  . Paresthesias in left hand 10/25/2017  . Dry nose 07/19/2017  . Paresthesia of foot, bilateral 04/12/2017  . Itching 02/22/2017  . Primary osteoarthritis of both hands 09/27/2016  . Primary osteoarthritis of both knees 09/27/2016  . Urinary frequency 09/26/2016  . Screen for STD (sexually transmitted disease) 09/26/2016  . 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  . Nasopharyngitis, 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/2016  . Postnasal drip 05/18/2016  . S/P total knee replacement 02/13/2016  . Chronic pain of both knees 12/06/2015  . Globus pharyngeus 05/17/2015  . Productive cough 05/17/2015  . Genetic testing 12/23/2014  . Family history of breast cancer   . Family history of colon cancer   . Family history of pancreatic cancer   . Breast cancer of upper-outer quadrant of right female breast ([place]) 11/23/2014  .  Allergy to ACE inhibitors 09/27/2014  . Encounter for Medicare annual wellness exam 06/27/2014  . Hyperglycemia 06/23/2013  . Routine general medical examination at a health care facility 06/23/2013  . Dizziness and giddiness 01/16/2013  . Disturbance of skin sensation 01/16/2013  . RLS (restless legs syndrome) 01/15/2013  . Hx of Clostridium  difficile infection 10/10/2012  . Bronchiectasis without acute exacerbation ([place]) 04/29/2012  . Dyspnea on exertion 04/02/2012  . Non-ischemic cardiomyopathy ([place]) 03/07/2012  . Colon cancer screening 01/11/2012  . Post-menopausal 01/11/2012  . Chest pain 11/07/2011  . Palpitations 04/17/2011  . Abnormal tympanic membrane 01/22/2011  . Obstructive sleep apnea 04/04/2010  . VENTRICULAR TACHYCARDIA 01/03/2009  . ICD  [employer]  Single chamber 01/03/2009  . PATENT FORAMEN OVALE 09/29/2008  . Vitamin D deficiency 08/17/2008  . Hypothyroidism 11/18/2006  . Hyperlipidemia 11/18/2006  . Depressed mood 11/18/2006  . Essential hypertension 11/18/2006  . MITRAL VALVE PROLAPSE 11/18/2006  . CEREBRAL ANEURYSM 11/18/2006  . Allergic rhinitis 11/18/2006  . GERD 11/18/2006  . DIVERTICULOSIS, COLON 11/18/2006  . Fatty liver 11/18/2006  . Fibromyalgia 11/18/2006    Past Medical History:  Diagnosis Date  . AICD (automatic cardioverter/defibrillator) present   . Anxiety   . Arthritis   . 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 Cerebral aneurysm, nonruptured    had a clip put in  . Clostridium difficile infection   . Depressive disorder, not elsewhere classified   . Diverticulosis of colon (without mention of hemorrhage)   . Esophageal reflux   . Fibromyalgia   . Gastritis   . GERD (gastroesophageal reflux disease)   . GI bleed 2004  . Hiatal hernia   . Hyperlipidemia   . Hypertension   . Hypothyroidism   . Internal hemorrhoids   . Obstructive sleep apnea (adult) (pediatric)   . Ostium secundum type atrial septal defect   . Other chronic nonalcoholic liver disease   . Other pulmonary embolism and infarction   . Paroxysmal ventricular tachycardia ([place])   . Personal history of radiation therapy   . Pneumonia    history  . Presence of permanent cardiac pacemaker   . PUD (peptic ulcer disease)   . Radiation 02/03/15-03/10/15   Right Breast  .  Sarcoid    per pt , not sure  . Schatzki's ring   . Stroke [facility]) 2013   tia/ pt feels it was around 2008 0r 2009  . Takotsubo syndrome   . Tubular adenoma of colon   . Unspecified transient cerebral ischemia   . Unspecified vitamin D deficiency     Family History  Problem Relation Age of Onset  . Diabetes Mother   . Alzheimer's disease Mother   . Other Brother        Thyroid problem 10/2016  . Allergies Sister   . Heart disease Sister   . Heart disease Brother   . Prostate cancer Brother 61       same brother as throat cancer  . Throat cancer Brother        dx in his 85s; also a smoker  . Heart disease Father   . Cancer Maternal Grandmother 17       colon cancer or abdominal cancer  . Lung cancer Maternal Grandfather 41  . Breast cancer Maternal Aunt 50  . Colon cancer Maternal Aunt 43       same sister as breast at 2  . Lung cancer Maternal Uncle   .  Breast cancer Maternal Aunt        dx in her 64s  . Pancreatic cancer Cousin 43       maternal first cousin  . Breast cancer Cousin        paternal first cousin twice removed died in her 92s  . Cardiomyopathy Unknown        Family history  . Heart disease Son        Cardiac Arrest 07/2016  . Stroke Neg Hx    Past Surgical History:  Procedure Laterality Date  . ABDOMINAL HYSTERECTOMY    . ABI  2006   normal  . BREAST EXCISIONAL BIOPSY    . BREAST LUMPECTOMY     right breast 2016  . BREAST LUMPECTOMY WITH RADIOACTIVE SEED LOCALIZATION Right 01/03/2015   Procedure: BREAST LUMPECTOMY WITH RADIOACTIVE SEED LOCALIZATION;  Surgeon: Autumn Messing III, MD;  Location: [place];  Service: General;  Laterality: Right;  . CARDIAC DEFIBRILLATOR PLACEMENT  2006; 2012   BSX single chamber ICD  . carotid dopplers  2006   neg  . CEREBRAL ANEURYSM REPAIR  02/1999  . COLONOSCOPY    . hospitalization  2004   GI bleed, PUD, diverticulosis (EGD,colonscopy)  . hospitalization     PE, NSVT, s/p defib  . JOINT REPLACEMENT  2017   left knee    . KNEE ARTHROSCOPY     bilateral  . LEFT HEART CATHETERIZATION WITH CORONARY ANGIOGRAM N/A 02/22/2012   Procedure: LEFT HEART CATHETERIZATION WITH CORONARY ANGIOGRAM;  Surgeon: Burnell Blanks, MD;  Location: [facility] CATH LAB;  Service: Cardiovascular;  Laterality: N/A;  . PARTIAL HYSTERECTOMY     Fibroids  . TONGUE BIOPSY  12/12/2017   due to sore tongue and white patches/abnormal cells  . TOTAL KNEE ARTHROPLASTY Left 02/13/2016   Procedure: TOTAL KNEE ARTHROPLASTY;  Surgeon: Vickey Huger, MD;  Location: [place];  Service: Orthopedics;  Laterality: Left;  . UPPER GASTROINTESTINAL ENDOSCOPY     Social History   Social History Narrative   Lives alone   Caffeine ZOX:WRUE   Retired from [employer]   2 children   Divorced   Right handed      Objective: Vital Signs: BP 124/68 (BP Location: Left Arm, Patient Position: Sitting, Cuff Size: Large)   Pulse 65   Resp 14   Ht 5' 3.5" (1.613 m)   Wt 218 lb (98.9 kg)   LMP  (LMP Unknown)   BMI 38.01 kg/m    Physical Exam  Constitutional: She is oriented to person, place, and time. She appears well-developed and well-nourished.  HENT:  Head: Normocephalic and atraumatic.  Eyes: Conjunctivae and EOM are normal.  Neck: Normal range of motion.  Cardiovascular: Normal rate, regular rhythm, normal heart sounds and intact distal pulses.  Pulmonary/Chest: Effort normal and breath sounds normal.  Abdominal: Soft. Bowel sounds are normal.  Lymphadenopathy:    She has no cervical adenopathy.  Neurological: She is alert and oriented to person, place, and time.  Skin: Skin is warm and dry. Capillary refill takes less than 2 seconds.  Psychiatric: She has a normal mood and affect. Her behavior is normal.  Nursing note and vitals reviewed.    Musculoskeletal Exam: C-spine limited ROM.  Thoracic and lumbar spine good ROM.  Shoulder joints, elbow joints, wrist joints, MCPs, PIPs, and DIPs good ROM with no synovitis.  She has discomfort with  shoulder ROM bilaterally.  Hip joints, knee joints, ankle joints, MTPs, PIPs, and DIP good ROM with  no synovitis. Left knee replacement good ROM with no discomfort. No warmth or effusion of knee joints. Limited ROM of right knee.  She has tenderness of the right popliteal region. Trochanteric bursa bilaterally.    CDAI Exam: No CDAI exam completed.    Investigation: No additional findings. CBC Latest Ref Rng & Units 11/27/2017 05/30/2017 02/27/2017  WBC 4.0 - 10.5 K/uL 4.1 4.2 4.5  Hemoglobin 12.0 - 15.0 g/dL 12.9 13.5 13.2  Hematocrit 36.0 - 46.0 % 39.2 41.8 40.4  Platelets 150.0 - 400.0 K/uL 205.0 221.0 224   CMP Latest Ref Rng & Units 11/27/2017 02/27/2017 02/22/2017  Glucose 70 - 99 mg/dL 109(H) 91 122(H)  BUN 6 - 23 mg/dL 9 14 13   Creatinine 0.40 - 1.20 mg/dL 0.78 0.82 0.89  Sodium 135 - 145 mEq/L 140 139 141  Potassium 3.5 - 5.1 mEq/L 3.9 4.1 3.8  Chloride 96 - 112 mEq/L 103 103 104  CO2 19 - 32 mEq/L 31 27 32  Calcium 8.4 - 10.5 mg/dL 9.9 10.3 10.0  Total Protein 6.0 - 8.3 g/dL 7.5 7.6 7.5  Total Bilirubin 0.2 - 1.2 mg/dL 0.4 0.5 0.4  Alkaline Phos 39 - 117 U/L 100 120 107  AST 0 - 37 U/L 37 36(H) 33  ALT 0 - 35 U/L 42(H) 39(H) 35    Imaging: Mm Diag Breast Tomo Bilateral  Result Date: 01/07/2018 CLINICAL DATA:  Patient with history of right breast lumpectomy 2016. EXAM: DIGITAL DIAGNOSTIC BILATERAL MAMMOGRAM WITH CAD AND TOMO COMPARISON:  Previous exam(s). ACR Breast Density Category b: There are scattered areas of fibroglandular density. FINDINGS: Stable postlumpectomy changes right breast. No concerning masses, calcifications or distortion identified within either breast. Mammographic images were processed with CAD. IMPRESSION: Stable postlumpectomy changes right breast. No mammographic evidence for malignancy. RECOMMENDATION: Bilateral diagnostic mammography 1 year. I have discussed the findings and recommendations with the patient. Results were also provided in writing at the  conclusion of the visit. If applicable, a reminder letter will be sent to the patient regarding the next appointment. BI-RADS CATEGORY  2: Benign. Electronically Signed   By: Lovey Newcomer M.D.   On: 01/07/2018 12:27    Speciality Comments: No specialty comments available.    Procedures:  No procedures performed Allergies: Ace inhibitors; Dilantin [phenytoin sodium extended]; Atorvastatin; Ramipril; Rosuvastatin; Iodine-131; Paroxetine; Amitriptyline hcl; Carbamazepine; Cetirizine hcl; Codeine; and Phenytoin sodium extended   Assessment / Plan:     Visit Diagnoses: Fibromyalgia -her fibromyalgia has been well controlled.  She has been trying to stay active.  She takes baclofen on an as-needed basis.  She also takes tramadol 50 mg 1 to 2 tablets once weekly for pain relief.  She never started gabapentin due to being apprehensive side effects.  She has tenderness of the right trochanteric bursa today.  She declined a cortisone injection.  A referral was placed to physical therapy.  She was advised to continue to stay active and exercise on a regular basis.  Her insomnia continues but she is taking Ambien 10 mg at bedtime.   Primary osteoarthritis of both hands: She has PIP and DIP synovial thickening consistent with osteoarthritis of bilateral hands.  Joint protection and muscle strengthening were discussed.  She has no pain in her hands or stiffness at this time.  Primary osteoarthritis of right knee: No warmth or effusion on exam.  She is going to schedule a right knee replaced by Dr. Lorre Nick in August 2019.  She had a right knee cortisone injection  about 2 weeks ago performed by Dr. Lorre Nick which resolved her knee pain.  Status post total knee replacement, left: Doing well good range of motion with no discomfort.  No warmth or effusion on exam.  Medication monitoring encounter -UDS will be updated today.  She has updated her narcotic agreement.  She does not need a refill of tramadol at this time.  She  has been taking tramadol 50 mg by mouth 1-2 times per week as needed for pain relief.  Plan: Pain Mgmt, Tramadol w/medMATCH, U, Pain Mgmt, Profile 5 w/Conf, U  Trochanteric bursitis, right hip -She has tenderness of the right trochanteric bursa and IT band.  She declined a cortisone injection today in the office.  She was given a handout of exercises that she can perform at home.  A referral was made to physical therapy for management of right trochanteric bursitis as well as right IT band syndrome.  Plan: Ambulatory referral to Physical Therapy   Other medical conditions are listed as follows:  History of gastroesophageal reflux (GERD)  History of hypertension  History of breast cancer  Brain aneurysm  History of vitamin D deficiency  History of depression  History of hypothyroidism  Hx of Clostridium difficile infection  History of sleep apnea - uses CPAP  RLS (restless legs syndrome)  History of diverticulosis  History of migraine  History of hypercholesterolemia  History of TIA (transient ischemic attack)  Cardiac arrhythmia, unspecified cardiac arrhythmia type - S/P pacer    Orders: Orders Placed This Encounter  Procedures  . Pain Mgmt, Tramadol w/medMATCH, U  . Pain Mgmt, Profile 5 w/Conf, U  . Ambulatory referral to Physical Therapy   No orders of the defined types were placed in this encounter.   Face-to-face time spent with patient was 30 minutes.> 50% of time was spent in counseling and coordination of care.  Follow-Up Instructions: Return in about 6 months (around 07/19/2018) for Fibromyalgia, Osteoarthritis.   Ofilia Neas, PA-C   I examined and evaluated the patient with Hazel Sams PA.  She has significant discomfort over right trochanteric bursa on my exam.  IT band exercises were demonstrated and discussed a handout was given.  We also referred her to physical therapy.  The plan of care was discussed as noted above.  Bo Merino,  MD  Note - This record has been created using Editor, commissioning.  Chart creation errors have been sought, but may not always  have been located. Such creation errors do not reflect on  the standard of medical care.

## 2018-01-02 NOTE — Progress Notes (Signed)
Reason for visit: Neck pain, left arm paresthesias  Referring physician: Dr. Saverio Danker [name] is a 67 y.o. female  History of present illness:  [name] is a 67 year old right-handed black female with a history of fibromyalgia.  The patient comes in today with a new issue, she noted onset spontaneously 3 months ago of neck pain on the left side primarily with paresthesias going down mainly into the index finger of the left hand.  She denies any significant weakness of the extremity.  She denies any increased symptoms with turning her head from one side to the next.  She does have stiffness of the neck.  She has had significant problems with degenerative arthritis of the knees, she has had a left total knee replacement and she has severe arthritis of the right knee making it difficult to walk.  She has not noted any change in numbness of the legs, she has not had any falls or difficulty controlling the bowels or the bladder.  She indicates that the neck pain is oftentimes worse when she is inactive, and when she is trying to get to sleep at night.  She feels better when she is up and moving around.  She has a prior history of a cerebral aneurysm with a aneurysm clip, she cannot have MRI evaluation.  She comes to this office for an evaluation.  Past Medical History:  Diagnosis Date  . AICD (automatic cardioverter/defibrillator) present   . Anxiety   . Arthritis   . Breast cancer ([place]) 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  . Cerebral aneurysm, nonruptured    had a clip put in  . Clostridium difficile infection   . Depressive disorder, not elsewhere classified   . Diverticulosis of colon (without mention of hemorrhage)   . Esophageal reflux   . Fibromyalgia   . Gastritis   . GERD (gastroesophageal reflux disease)   . GI bleed 2004  . Hiatal hernia   . Hyperlipidemia   . Hypertension   . Hypothyroidism   . Internal hemorrhoids   . Obstructive sleep apnea (adult)  (pediatric)   . Ostium secundum type atrial septal defect   . Other chronic nonalcoholic liver disease   . Other pulmonary embolism and infarction   . Paroxysmal ventricular tachycardia ([place])   . Personal history of radiation therapy   . Pneumonia    history  . Presence of permanent cardiac pacemaker   . PUD (peptic ulcer disease)   . Radiation 02/03/15-03/10/15   Right Breast  . Sarcoid    per pt , not sure  . Schatzki's ring   . Stroke [facility]) 2013   tia/ pt feels it was around 2008 0r 2009  . Takotsubo syndrome   . Tubular adenoma of colon   . Unspecified transient cerebral ischemia   . Unspecified vitamin D deficiency     Past Surgical History:  Procedure Laterality Date  . ABDOMINAL HYSTERECTOMY    . ABI  2006   normal  . BREAST EXCISIONAL BIOPSY    . BREAST LUMPECTOMY     right breast 2016  . BREAST LUMPECTOMY WITH RADIOACTIVE SEED LOCALIZATION Right 01/03/2015   Procedure: BREAST LUMPECTOMY WITH RADIOACTIVE SEED LOCALIZATION;  Surgeon: Autumn Messing III, MD;  Location: [place];  Service: General;  Laterality: Right;  . CARDIAC DEFIBRILLATOR PLACEMENT  2006; 2012   BSX single chamber ICD  . carotid dopplers  2006   neg  . CEREBRAL ANEURYSM REPAIR  02/1999  . hospitalization  2004   GI bleed, PUD, diverticulosis (EGD,colonscopy)  . hospitalization     PE, NSVT, s/p defib  . JOINT REPLACEMENT  2017   left knee   . KNEE ARTHROSCOPY     bilateral  . LEFT HEART CATHETERIZATION WITH CORONARY ANGIOGRAM N/A 02/22/2012   Procedure: LEFT HEART CATHETERIZATION WITH CORONARY ANGIOGRAM;  Surgeon: Burnell Blanks, MD;  Location: [facility] CATH LAB;  Service: Cardiovascular;  Laterality: N/A;  . PARTIAL HYSTERECTOMY     Fibroids  . TONGUE BIOPSY  12/12/2017   due to sore tongue and white patches/abnormal cells  . TOTAL KNEE ARTHROPLASTY Left 02/13/2016   Procedure: TOTAL KNEE ARTHROPLASTY;  Surgeon: Vickey Huger, MD;  Location: [place];  Service: Orthopedics;  Laterality: Left;    Family  History  Problem Relation Age of Onset  . Diabetes Mother   . Alzheimer's disease Mother   . Other Brother        Thyroid problem 10/2016  . Allergies Sister   . Heart disease Sister   . Heart disease Brother   . Prostate cancer Brother 42       same brother as throat cancer  . Throat cancer Brother        dx in his 59s; also a smoker  . Heart disease Father   . Cancer Maternal Grandmother 28       colon cancer or abdominal cancer  . Lung cancer Maternal Grandfather 75  . Breast cancer Maternal Aunt 48  . Colon cancer Maternal Aunt 23       same sister as breast at 79  . Lung cancer Maternal Uncle   . Breast cancer Maternal Aunt        dx in her 20s  . Pancreatic cancer Cousin 78       maternal first cousin  . Breast cancer Cousin        paternal first cousin twice removed died in her 79s  . Cardiomyopathy Unknown        Family history  . Heart disease Son        Cardiac Arrest 07/2016  . Stroke Neg Hx     Social history:  reports that she quit smoking about 28 years ago. Her smoking use included cigarettes. She has a 15.00 pack-year smoking history. She has never used smokeless tobacco. She reports that she drinks alcohol. She reports that she does not use drugs.  Medications:  Prior to Admission medications   Medication Sig Start Date End Date Taking? Authorizing Provider  acetaminophen (TYLENOL) 500 MG tablet Take 500 mg by mouth every 6 (six) hours as needed (pain).     [provider]  albuterol (VENTOLIN HFA) 108 (90 Base) MCG/ACT inhaler Inhale 2 puffs into the lungs every 6 (six) hours as needed for wheezing or shortness of breath. 05/30/17   Chesley Mires, MD  aspirin 81 MG tablet Take 81 mg by mouth daily.    [provider]  baclofen (LIORESAL) 10 MG tablet TAKE 1 TABLET THREE TIMES DAILY Patient taking differently: TAKE 1 TABLET PRN 08/07/17   Bo Merino, MD  buPROPion (WELLBUTRIN XL) 150 MG 24 hr tablet Take 1 tablet (150 mg total) by  mouth daily. 12/17/17   Tower, [name], MD  calcium carbonate (OSCAL) 1500 (600 Ca) MG TABS tablet Take 600 mg of elemental calcium by mouth daily with breakfast.    [provider]  carvedilol (COREG) 12.5 MG tablet Take 0.5 tablets (  6.25 mg total) by mouth 2 (two) times daily. 12/05/17 03/05/18  Deboraha Sprang, MD  cetirizine (ZYRTEC) 10 MG tablet Take 10 mg by mouth daily. Take one prn alternating with Allegra    [provider]  cholecalciferol (VITAMIN D) 1000 UNITS tablet Take 5,000 Units by mouth daily.     [provider]  fexofenadine (ALLEGRA) 180 MG tablet Take 180 mg by mouth daily as needed for allergies or rhinitis.    [provider]  fluticasone (FLONASE) 50 MCG/ACT nasal spray Place 2 sprays into both nostrils daily as needed for allergies or rhinitis. 12/03/17   Tower, [name], MD  gabapentin (NEURONTIN) 300 MG capsule Take 1 capsule (300 mg total) by mouth 2 (two) times daily. 01/02/18   Kathrynn Ducking, MD  hydrochlorothiazide (HYDRODIURIL) 12.5 MG tablet Take 1 tablet (12.5 mg total) by mouth daily. 12/05/17   Deboraha Sprang, MD  latanoprost (XALATAN) 0.005 % ophthalmic solution Place 1 drop into both eyes at bedtime.    [provider]  levothyroxine (SYNTHROID, LEVOTHROID) 50 MCG tablet Take 1 tablet (50 mcg total) by mouth daily. 12/03/17   Tower, [name], MD  mometasone (ELOCON) 0.1 % lotion as needed.  02/19/17   [provider]  montelukast (SINGULAIR) 10 MG tablet Take 1 tablet (10 mg total) by mouth at bedtime. 11/20/16   Parrett, Fonnie Mu, NP  Multiple Vitamins-Minerals (MULTIVITAMIN PO) Take 1 tablet by mouth daily.    [provider]  NON FORMULARY HEMP/CBD OIL -takes a few drops or a pill prn    [provider]  pantoprazole (PROTONIX) 40 MG tablet TAKE 1 TABLET EVERY DAY Patient taking differently: TAKE 1 TABLET EVERY PRN 05/28/17   Pyrtle, Lajuan Lines, MD  polyethylene glycol powder (GLYCOLAX/MIRALAX) powder  Take 17 grams by mouth daily Patient taking differently: Take 17 grams by mouth PRN 02/27/16   Pyrtle, Lajuan Lines, MD  potassium chloride (K-DUR) 10 MEQ tablet TAKE 1/2 TABLET (5MEQ TOTAL) EVERY DAY 12/03/17   Tower, [name], MD  simvastatin (ZOCOR) 40 MG tablet TAKE 1/2 TABLET  (20  MG  TOTAL) EVERY DAY 12/05/17   Deboraha Sprang, MD  traMADol (ULTRAM) 50 MG tablet Take 1 tablet (50 mg total) by mouth at bedtime. Patient taking differently: Take 50 mg by mouth as needed.  07/17/17   Ofilia Neas, PA-C  zolpidem (AMBIEN) 10 MG tablet Take 1 tablet (10 mg total) by mouth at bedtime as needed. for sleep Patient taking differently: Take 10 mg by mouth at bedtime. for sleep 11/27/17   Tower, [name], MD      Allergies  Allergen Reactions  . Ace Inhibitors Swelling    Angioedema; makes tongue "break out" Angioedema; makes tongue "break out"  . Dilantin [Phenytoin Sodium Extended] Rash    Severe rash  . Atorvastatin Other (See Comments)    SEVERE MYALGIAS, MUSCLE PAIN SEVERE MYALGIAS, MUSCLE PAIN  . Ramipril Other (See Comments)    TONGUE ULCERS TONGUE ULCERS  . Rosuvastatin Other (See Comments)    MYALGIAS, SEVERE MUSCLE PAIN MYALGIAS, SEVERE MUSCLE PAIN  . Iodine-131 Other (See Comments)    Swelling at IV site only, no swelling or SOB around mouth.!  . Paroxetine Other (See Comments)    UNKNOWN UNKNOWN  . Amitriptyline Hcl Other (See Comments)     makes her too sleepy!  . Carbamazepine Rash    REACTION: ? reaction REACTION: ? reaction  . Cetirizine Hcl Other (See  Comments)    REACTION: sleepy  . Codeine Itching  . Phenytoin Sodium Extended Rash    ROS:  Out of a complete 14 system review of symptoms, the patient complains only of the following symptoms, and all other reviewed systems are negative.  Shortness of breath, snoring Itching Joint pain Allergies, frequent infections Numbness, dizziness Depression, disinterest in activities, racing thoughts  Blood pressure (!)  142/78, pulse 68, height 5' 6.5" (1.689 m), weight 217 lb 8 oz (98.7 kg), SpO2 98 %.  Physical Exam  General: The patient is alert and cooperative at the time of the examination.  Patient is moderately to markedly obese.  Eyes: Pupils are equal, round, and reactive to light. Discs are flat bilaterally.  Neck: The neck is supple, no carotid bruits are noted.  Respiratory: The respiratory examination is clear.  Cardiovascular: The cardiovascular examination reveals a regular rate and rhythm, no obvious murmurs or rubs are noted.  Neuromuscular: The patient lacks about 30 degrees of full lateral rotation to the left, 20 degrees to the right.  Skin: Extremities are without significant edema.  Neurologic Exam  Mental status: The patient is alert and oriented x 3 at the time of the examination. The patient has apparent normal recent and remote memory, with an apparently normal attention span and concentration ability.  Cranial nerves: Facial symmetry is present. There is good sensation of the face to pinprick and soft touch bilaterally. The strength of the facial muscles and the muscles to head turning and shoulder shrug are normal bilaterally. Speech is well enunciated, no aphasia or dysarthria is noted. Extraocular movements are full. Visual fields are full. The tongue is midline, and the patient has symmetric elevation of the soft palate. No obvious hearing deficits are noted.  Motor: The motor testing reveals 5 over 5 strength of all 4 extremities. Good symmetric motor tone is noted throughout.  Sensory: Sensory testing is intact to pinprick, soft touch, vibration sensation, and position sense on all 4 extremities. No evidence of extinction is noted.  Coordination: Cerebellar testing reveals good finger-nose-finger and heel-to-shin bilaterally.  Gait and station: Gait is normal. Tandem gait is normal. Romberg is negative. No drift is seen.  Reflexes: Deep tendon reflexes are symmetric,  but are depressed bilaterally. Toes are downgoing bilaterally.   XR cervical 10/25/17:  IMPRESSION: 1. Cervical spondylosis with disc space narrowing at all levels of the cervical spine as above. Fused appearance of the C5-6 interspace. 2. Mild bilateral neural foraminal encroachment on the left at C5-6 and C6-7, mild on the right at C3-4 and mild-to-moderate on the right at C4-5.   Assessment/Plan:  1.  Neck pain, left hand paresthesias  2.  Fibromyalgia  The patient has some persistent paresthesias into the left hand that could represent low-grade irritation of the C6 nerve root.  The patient will undergo nerve conduction studies on both arms, EMG on the left arm.  She will be set up for CT scan of the cervical spine.  She may be candidate for epidural steroid injections.  Gabapentin will be started taking 300 mg twice daily.  She will follow-up in 4 months otherwise.  Jill Alexanders MD 01/02/2018 3:34 PM  [facility] [address] [place], [place] [address]  Phone [phone] Fax [phone]

## 2018-01-02 NOTE — Patient Instructions (Addendum)
   We will get EMG and NCV of the arms to look at the nerve function, we will get CT of the neck.  We will start gabapentin 300 mg twice a day for the neck pain.  Neurontin (gabapentin) may result in drowsiness, ankle swelling, gait instability, or possibly dizziness. Please contact our office if significant side effects occur with this medication.

## 2018-01-06 ENCOUNTER — Encounter: Payer: Self-pay | Admitting: Internal Medicine

## 2018-01-06 ENCOUNTER — Other Ambulatory Visit: Payer: Self-pay

## 2018-01-06 ENCOUNTER — Ambulatory Visit (AMBULATORY_SURGERY_CENTER): Payer: Medicare Other | Admitting: Internal Medicine

## 2018-01-06 VITALS — BP 130/54 | HR 65 | Temp 98.4°F | Resp 13 | Ht 66.5 in | Wt 216.0 lb

## 2018-01-06 DIAGNOSIS — D122 Benign neoplasm of ascending colon: Secondary | ICD-10-CM | POA: Diagnosis not present

## 2018-01-06 DIAGNOSIS — K31A0 Gastric intestinal metaplasia, unspecified: Secondary | ICD-10-CM

## 2018-01-06 DIAGNOSIS — D123 Benign neoplasm of transverse colon: Secondary | ICD-10-CM

## 2018-01-06 DIAGNOSIS — Z86010 Personal history of colonic polyps: Secondary | ICD-10-CM

## 2018-01-06 DIAGNOSIS — K3189 Other diseases of stomach and duodenum: Secondary | ICD-10-CM

## 2018-01-06 MED ORDER — SODIUM CHLORIDE 0.9 % IV SOLN: 500.0000 mL | Freq: Once | INTRAVENOUS | Status: DC

## 2018-01-06 MED ORDER — PANTOPRAZOLE SODIUM 40 MG PO TBEC: 40.0000 mg | DELAYED_RELEASE_TABLET | Freq: Every day | ORAL | 11 refills | Status: DC

## 2018-01-06 NOTE — Op Note (Signed)
[facility] Patient Name: [name] Procedure Date: 01/06/2018 2:14 PM MRN: [id number] Endoscopist: Jerene Bears , MD Age: 67 Referring MD:  Date of Birth: [birth year] Gender: Female Account #: [id number] Procedure:                Colonoscopy Indications:              Surveillance: Personal history of adenomatous                            polyps on last colonoscopy 5 years ago Medicines:                Monitored Anesthesia Care Procedure:                Pre-Anesthesia Assessment:                           - Prior to the procedure, a History and Physical                            was performed, and patient medications and                            allergies were reviewed. The patient's tolerance of                            previous anesthesia was also reviewed. The risks                            and benefits of the procedure and the sedation                            options and risks were discussed with the patient.                            All questions were answered, and informed consent                            was obtained. Prior Anticoagulants: The patient has                            taken no previous anticoagulant or antiplatelet                            agents. ASA Grade Assessment: III - A patient with                            severe systemic disease. After reviewing the risks                            and benefits, the patient was deemed in                            satisfactory condition to undergo the procedure.  After obtaining informed consent, the colonoscope                            was passed under direct vision. Throughout the                            procedure, the patient's blood pressure, pulse, and                            oxygen saturations were monitored continuously. The                            Colonoscope was introduced through the anus and                            advanced to the cecum,  identified by appendiceal                            orifice and ileocecal valve. The colonoscopy was                            performed without difficulty. The patient tolerated                            the procedure well. The quality of the bowel                            preparation was good. The ileocecal valve,                            appendiceal orifice, and rectum were photographed. Scope In: 2:20:27 PM Scope Out: 2:48:14 PM Scope Withdrawal Time: 0 hours 21 minutes 34 seconds  Total Procedure Duration: 0 hours 27 minutes 47 seconds  Findings:                 The digital rectal exam was normal.                           Two sessile polyps were found in the ascending                            colon. The polyps were 2 to 3 mm in size. These                            polyps were removed with a cold biopsy forceps.                            Resection and retrieval were complete.                           A 5 mm polyp was found in the transverse colon. The                            polyp was  sessile. The polyp was removed with a                            cold snare. Resection and retrieval were complete.                           Multiple small and large-mouthed diverticula were                            found from ascending colon to sigmoid colon.                           The retroflexed view of the distal rectum and anal                            verge was normal and showed no anal or rectal                            abnormalities. Complications:            No immediate complications. Estimated Blood Loss:     Estimated blood loss was minimal. Impression:               - Two 2 to 3 mm polyps in the ascending colon,                            removed with a cold biopsy forceps. Resected and                            retrieved.                           - One 5 mm polyp in the transverse colon, removed                            with a cold snare. Resected and  retrieved.                           - Severe diverticulosis from ascending colon to                            sigmoid colon.                           - The distal rectum and anal verge are normal on                            retroflexion view. Recommendation:           - Patient has a contact number available for                            emergencies. The signs and symptoms of potential  delayed complications were discussed with the                            patient. Return to normal activities tomorrow.                            Written discharge instructions were provided to the                            patient.                           - Resume previous diet.                           - Continue present medications.                           - Await pathology results.                           - Repeat colonoscopy is recommended for                            surveillance. The colonoscopy date will be                            determined after pathology results from today's                            exam become available for review. Jerene Bears, MD 01/06/2018 2:52:06 PM This report has been signed electronically.

## 2018-01-06 NOTE — Progress Notes (Signed)
No changes in medical or surgical hx since PV per pt 

## 2018-01-06 NOTE — Patient Instructions (Signed)
YOU HAD AN ENDOSCOPIC PROCEDURE TODAY AT THE [facility]:   Refer to the procedure report that was given to you for any specific questions about what was found during the examination.  If the procedure report does not answer your questions, please call your gastroenterologist to clarify.  If you requested that your care partner not be given the details of your procedure findings, then the procedure report has been included in a sealed envelope for you to review at your convenience later.  YOU SHOULD EXPECT: Some feelings of bloating in the abdomen. Passage of more gas than usual.  Walking can help get rid of the air that was put into your GI tract during the procedure and reduce the bloating. If you had a lower endoscopy (such as a colonoscopy or flexible sigmoidoscopy) you may notice spotting of blood in your stool or on the toilet paper. If you underwent a bowel prep for your procedure, you may not have a normal bowel movement for a few days.  Please Note:  You might notice some irritation and congestion in your nose or some drainage.  This is from the oxygen used during your procedure.  There is no need for concern and it should clear up in a day or so.  SYMPTOMS TO REPORT IMMEDIATELY:   Following lower endoscopy (colonoscopy or flexible sigmoidoscopy):  Excessive amounts of blood in the stool  Significant tenderness or worsening of abdominal pains  Swelling of the abdomen that is new, acute  Fever of 100F or higher  For urgent or emergent issues, a gastroenterologist can be reached at any hour by calling [phone].   DIET:  We do recommend a small meal at first, but then you may proceed to your regular diet.  Drink plenty of fluids but you should avoid alcoholic beverages for 24 hours.  MEDICATIONS: Continue present medications.  Please see handouts given to you by your recovery nurse.  ACTIVITY:  You should plan to take it easy for the rest of today and you should NOT  DRIVE or use heavy machinery until tomorrow (because of the sedation medicines used during the test).    FOLLOW UP: Our staff will call the number listed on your records the next business day following your procedure to check on you and address any questions or concerns that you may have regarding the information given to you following your procedure. If we do not reach you, we will leave a message.  However, if you are feeling well and you are not experiencing any problems, there is no need to return our call.  We will assume that you have returned to your regular daily activities without incident.  If any biopsies were taken you will be contacted by phone or by letter within the next 1-3 weeks.  Please call us at [phone] if you have not heard about the biopsies in 3 weeks.   Thank you for allowing us to provide for your healthcare needs today.   SIGNATURES/CONFIDENTIALITY: You and/or your care partner have signed paperwork which will be entered into your electronic medical record.  These signatures attest to the fact that that the information above on your After Visit Summary has been reviewed and is understood.  Full responsibility of the confidentiality of this discharge information lies with you and/or your care-partner. 

## 2018-01-06 NOTE — Progress Notes (Signed)
Called to room to assist during endoscopic procedure.  Patient ID and intended procedure confirmed with present staff. Received instructions for my participation in the procedure from the performing physician.  

## 2018-01-06 NOTE — Progress Notes (Signed)
Report given to PACU, vss 

## 2018-01-07 ENCOUNTER — Ambulatory Visit
Admission: RE | Admit: 2018-01-07 | Discharge: 2018-01-07 | Disposition: A | Discharge status: Home / Self Care | Payer: Medicare Other | Source: Ambulatory Visit | Attending: Family Medicine | Admitting: Family Medicine

## 2018-01-07 ENCOUNTER — Telehealth: Payer: Self-pay | Admitting: *Deleted

## 2018-01-07 DIAGNOSIS — Z853 Personal history of malignant neoplasm of breast: Secondary | ICD-10-CM

## 2018-01-07 NOTE — Telephone Encounter (Signed)
  Follow up Call-  Call back number 01/06/2018 01/04/2016  Post procedure Call Back phone  # [phone]  Permission to leave phone message Yes Yes  Some recent data might be hidden     Patient questions:  Do you have a fever, pain , or abdominal swelling? No. Pain Score  0 *  Have you tolerated food without any problems? Yes.    Have you been able to return to your normal activities? Yes.    Do you have any questions about your discharge instructions: Diet   No. Medications  No. Follow up visit  No.  Do you have questions or concerns about your Care? No.  Actions: * If pain score is 4 or above: No action needed, pain <4.

## 2018-01-08 ENCOUNTER — Other Ambulatory Visit: Payer: Self-pay | Admitting: Rheumatology

## 2018-01-08 ENCOUNTER — Other Ambulatory Visit: Payer: Self-pay | Admitting: Adult Health

## 2018-01-09 NOTE — Telephone Encounter (Signed)
Last visit: 07/17/2017 Next visit: 01/16/2018  Okay to refill baclofen?

## 2018-01-10 ENCOUNTER — Encounter: Payer: Self-pay | Admitting: Internal Medicine

## 2018-01-15 ENCOUNTER — Other Ambulatory Visit: Payer: Medicare Other

## 2018-01-16 ENCOUNTER — Ambulatory Visit (INDEPENDENT_AMBULATORY_CARE_PROVIDER_SITE_OTHER): Payer: Medicare Other | Admitting: Rheumatology

## 2018-01-16 ENCOUNTER — Encounter: Payer: Self-pay | Admitting: Rheumatology

## 2018-01-16 VITALS — BP 124/68 | HR 65 | Resp 14 | Ht 63.5 in | Wt 218.0 lb

## 2018-01-16 DIAGNOSIS — G2581 Restless legs syndrome: Secondary | ICD-10-CM

## 2018-01-16 DIAGNOSIS — M7061 Trochanteric bursitis, right hip: Secondary | ICD-10-CM

## 2018-01-16 DIAGNOSIS — Z8719 Personal history of other diseases of the digestive system: Secondary | ICD-10-CM

## 2018-01-16 DIAGNOSIS — Z853 Personal history of malignant neoplasm of breast: Secondary | ICD-10-CM | POA: Diagnosis not present

## 2018-01-16 DIAGNOSIS — Z8679 Personal history of other diseases of the circulatory system: Secondary | ICD-10-CM | POA: Diagnosis not present

## 2018-01-16 DIAGNOSIS — M1711 Unilateral primary osteoarthritis, right knee: Secondary | ICD-10-CM

## 2018-01-16 DIAGNOSIS — Z96652 Presence of left artificial knee joint: Secondary | ICD-10-CM | POA: Diagnosis not present

## 2018-01-16 DIAGNOSIS — I671 Cerebral aneurysm, nonruptured: Secondary | ICD-10-CM

## 2018-01-16 DIAGNOSIS — Z8669 Personal history of other diseases of the nervous system and sense organs: Secondary | ICD-10-CM | POA: Diagnosis not present

## 2018-01-16 DIAGNOSIS — Z8673 Personal history of transient ischemic attack (TIA), and cerebral infarction without residual deficits: Secondary | ICD-10-CM

## 2018-01-16 DIAGNOSIS — Z8639 Personal history of other endocrine, nutritional and metabolic disease: Secondary | ICD-10-CM | POA: Diagnosis not present

## 2018-01-16 DIAGNOSIS — Z8659 Personal history of other mental and behavioral disorders: Secondary | ICD-10-CM

## 2018-01-16 DIAGNOSIS — M797 Fibromyalgia: Secondary | ICD-10-CM | POA: Diagnosis not present

## 2018-01-16 DIAGNOSIS — M19041 Primary osteoarthritis, right hand: Secondary | ICD-10-CM | POA: Diagnosis not present

## 2018-01-16 DIAGNOSIS — Z8619 Personal history of other infectious and parasitic diseases: Secondary | ICD-10-CM

## 2018-01-16 DIAGNOSIS — Z5181 Encounter for therapeutic drug level monitoring: Secondary | ICD-10-CM

## 2018-01-16 DIAGNOSIS — I499 Cardiac arrhythmia, unspecified: Secondary | ICD-10-CM

## 2018-01-16 DIAGNOSIS — M19042 Primary osteoarthritis, left hand: Secondary | ICD-10-CM

## 2018-01-16 NOTE — Patient Instructions (Addendum)
Trochanteric Bursitis Rehab Ask your health care provider which exercises are safe for you. Do exercises exactly as told by your health care provider and adjust them as directed. It is normal to feel mild stretching, pulling, tightness, or discomfort as you do these exercises, but you should stop right away if you feel sudden pain or your pain gets worse.Do not begin these exercises until told by your health care provider. Stretching exercises These exercises warm up your muscles and joints and improve the movement and flexibility of your hip. These exercises also help to relieve pain and stiffness. Exercise A: Iliotibial band stretch  1. Lie on your side with your left / right leg in the top position. 2. Bend your left / right knee and grab your ankle. 3. Slowly bring your knee back so your thigh is behind your body. 4. Slowly lower your knee toward the floor until you feel a gentle stretch on the outside of your left / right thigh. If you do not feel a stretch and your knee will not fall farther, place the heel of your other foot on top of your outer knee and pull your thigh down farther. 5. Hold this position for __________ seconds. 6. Slowly return to the starting position. Repeat __________ times. Complete this exercise __________ times a day. Strengthening exercises These exercises build strength and endurance in your hip and pelvis. Endurance is the ability to use your muscles for a long time, even after they get tired. Exercise B: Bridge ( hip extensors) 1. Lie on your back on a firm surface with your knees bent and your feet flat on the floor. 2. Tighten your buttocks muscles and lift your buttocks off the floor until your trunk is level with your thighs. You should feel the muscles working in your buttocks and the back of your thighs. If this exercise is too easy, try doing it with your arms crossed over your chest. 3. Hold this position for __________ seconds. 4. Slowly return to the  starting position. 5. Let your muscles relax completely between repetitions. Repeat __________ times. Complete this exercise __________ times a day. Exercise C: Squats ( knee extensors and  quadriceps) 1. Stand in front of a table, with your feet and knees pointing straight ahead. You may rest your hands on the table for balance but not for support. 2. Slowly bend your knees and lower your hips like you are going to sit in a chair. ? Keep your weight over your heels, not over your toes. ? Keep your lower legs upright so they are parallel with the table legs. ? Do not let your hips go lower than your knees. ? Do not bend lower than told by your health care provider. ? If your hip pain increases, do not bend as low. 3. Hold this position for __________ seconds. 4. Slowly push with your legs to return to standing. Do not use your hands to pull yourself to standing. Repeat __________ times. Complete this exercise __________ times a day. Exercise D: Hip hike 1. Stand sideways on a bottom step. Stand on your left / right leg with your other foot unsupported next to the step. You can hold onto the railing or wall if needed for balance. 2. Keeping your knees straight and your torso square, lift your left / right hip up toward the ceiling. 3. Hold this position for __________ seconds. 4. Slowly let your left / right hip lower toward the floor, past the starting position. Your foot   should get closer to the floor. Do not lean or bend your knees. Repeat __________ times. Complete this exercise __________ times a day. Exercise E: Single leg stand 1. Stand near a counter or door frame that you can hold onto for balance as needed. It is helpful to stand in front of a mirror for this exercise so you can watch your hip. 2. Squeeze your left / right buttock muscles then lift up your other foot. Do not let your left / right hip push out to the side. 3. Hold this position for __________ seconds. Repeat  __________ times. Complete this exercise __________ times a day. This information is not intended to replace advice given to you by your health care provider. Make sure you discuss any questions you have with your health care provider. Document Released: 09/13/2004 Document Revised: 04/12/2016 Document Reviewed: 07/22/2015 [employer] Interactive Patient Education  2018 [employer]  Iliotibial Band Syndrome Rehab Ask your health care provider which exercises are safe for you. Do exercises exactly as told by your health care provider and adjust them as directed. It is normal to feel mild stretching, pulling, tightness, or discomfort as you do these exercises, but you should stop right away if you feel sudden pain or your pain gets worse.Do not begin these exercises until told by your health care provider. Stretching and range of motion exercises These exercises warm up your muscles and joints and improve the movement and flexibility of your hip and pelvis. Exercise A: Quadriceps, prone  1. Lie on your abdomen on a firm surface, such as a bed or padded floor. 2. Bend your left / right knee and hold your ankle. If you cannot reach your ankle or pant leg, loop a belt around your foot and grab the belt instead. 3. Gently pull your heel toward your buttocks. Your knee should not slide out to the side. You should feel a stretch in the front of your thigh and knee. 4. Hold this position for __________ seconds. Repeat __________ times. Complete this stretch __________ times a day. Exercise B: Iliotibial band  1. Lie on your side with your left / right leg in the top position. 2. Bend both of your knees and grab your left / right ankle. Stretch out your bottom arm to help you balance. 3. Slowly bring your top knee back so your thigh goes behind your trunk. 4. Slowly lower your top leg toward the floor until you feel a gentle stretch on the outside of your left / right hip and thigh. If you do not feel a  stretch and your knee will not fall farther, place the heel of your other foot on top of your knee and pull your knee down toward the floor with your foot. 5. Hold this position for __________ seconds. Repeat __________ times. Complete this stretch __________ times a day. Strengthening exercises These exercises build strength and endurance in your hip and pelvis. Endurance is the ability to use your muscles for a long time, even after they get tired. Exercise C: Straight leg raises ( hip abductors) 1. Lie on your side with your left / right leg in the top position. Lie so your head, shoulder, knee, and hip line up. You may bend your bottom knee to help you balance. 2. Roll your hips slightly forward so your hips are stacked directly over each other and your left / right knee is facing forward. 3. Tense the muscles in your outer thigh and lift your top leg   4-6 inches (10-15 cm). 4. Hold this position for __________ seconds. 5. Slowly return to the starting position. Let your muscles relax completely before doing another repetition. Repeat __________ times. Complete this exercise __________ times a day. Exercise D: Straight leg raises ( hip extensors) 1. Lie on your abdomen on your bed or a firm surface. You can put a pillow under your hips if that is more comfortable. 2. Bend your left / right knee so your foot is straight up in the air. 3. Squeeze your buttock muscles and lift your left / right thigh off the bed. Do not let your back arch. 4. Tense this muscle as hard as you can without increasing any knee pain. 5. Hold this position for __________ seconds. 6. Slowly lower your leg to the starting position and allow it to relax completely. Repeat __________ times. Complete this exercise __________ times a day. Exercise E: Hip hike 1. Stand sideways on a bottom step. Stand on your left / right leg with your other foot unsupported next to the step. You can hold onto the railing or wall if needed  for balance. 2. Keep your knees straight and your torso square. Then, lift your left / right hip up toward the ceiling. 3. Slowly let your left / right hip lower toward the floor, past the starting position. Your foot should get closer to the floor. Do not lean or bend your knees. Repeat __________ times. Complete this exercise __________ times a day. This information is not intended to replace advice given to you by your health care provider. Make sure you discuss any questions you have with your health care provider. Document Released: 08/06/2005 Document Revised: 04/10/2016 Document Reviewed: 07/08/2015 [employer] Interactive Patient Education  2018 [employer]  

## 2018-01-20 LAB — PAIN MGMT, PROFILE 5 W/CONF, U
Amphetamines: NEGATIVE ng/mL (ref <500)
Barbiturates: NEGATIVE ng/mL (ref <300)
Benzodiazepines: NEGATIVE ng/mL (ref <100)
Cocaine Metabolite: NEGATIVE ng/mL (ref <150)
Creatinine: 108.5 mg/dL
MARIJUANA METABOLITE: NEGATIVE ng/mL (ref <20)
Methadone Metabolite: NEGATIVE ng/mL (ref <100)
Opiates: NEGATIVE ng/mL (ref <100)
Oxidant: NEGATIVE ug/mL (ref <200)
Oxycodone: NEGATIVE ng/mL (ref <100)
PH: 6.99 (ref 4.5–9.0)

## 2018-01-20 LAB — PAIN MGMT, TRAMADOL W/MEDMATCH, U
Desmethyltramadol: 519 ng/mL — ABNORMAL HIGH (ref <100)
Tramadol: 2343 ng/mL — ABNORMAL HIGH (ref <100)

## 2018-01-20 NOTE — Progress Notes (Signed)
Consistent with treatment

## 2018-01-21 ENCOUNTER — Ambulatory Visit (INDEPENDENT_AMBULATORY_CARE_PROVIDER_SITE_OTHER): Payer: Medicare Other | Admitting: Family Medicine

## 2018-01-21 ENCOUNTER — Encounter: Payer: Self-pay | Admitting: Family Medicine

## 2018-01-21 VITALS — BP 114/66 | HR 69 | Temp 97.6°F | Ht 66.75 in | Wt 215.5 lb

## 2018-01-21 DIAGNOSIS — R739 Hyperglycemia, unspecified: Secondary | ICD-10-CM | POA: Diagnosis not present

## 2018-01-21 DIAGNOSIS — Z01818 Encounter for other preprocedural examination: Secondary | ICD-10-CM | POA: Diagnosis not present

## 2018-01-21 DIAGNOSIS — F329 Major depressive disorder, single episode, unspecified: Secondary | ICD-10-CM

## 2018-01-21 DIAGNOSIS — I10 Essential (primary) hypertension: Secondary | ICD-10-CM

## 2018-01-21 DIAGNOSIS — M170 Bilateral primary osteoarthritis of knee: Secondary | ICD-10-CM | POA: Diagnosis not present

## 2018-01-21 DIAGNOSIS — J479 Bronchiectasis, uncomplicated: Secondary | ICD-10-CM

## 2018-01-21 DIAGNOSIS — R058 Other specified cough: Secondary | ICD-10-CM

## 2018-01-21 DIAGNOSIS — R05 Cough: Secondary | ICD-10-CM | POA: Diagnosis not present

## 2018-01-21 DIAGNOSIS — R4589 Other symptoms and signs involving emotional state: Secondary | ICD-10-CM

## 2018-01-21 MED ORDER — DOXYCYCLINE HYCLATE 100 MG PO TABS: 100.0000 mg | ORAL_TABLET | Freq: Two times a day (BID) | ORAL | 0 refills | Status: DC

## 2018-01-21 NOTE — Assessment & Plan Note (Signed)
Now with prod cough/yellow sputum Otherwise feels ok  Reassuring exam  Cover with doxycycline  F/u with pulm soon

## 2018-01-21 NOTE — Patient Instructions (Signed)
For productive cough take the doxycycline  Drink lots of water  Take mucinex  Follow up with pulmonary   We will work on setting you up with a new counselor  Continue the wellbutrin   I will do the paperwork for medical-surgical clearance You will need to be cleared by cardiology and pulmonary as well

## 2018-01-21 NOTE — Assessment & Plan Note (Signed)
bp in fair control at this time  BP Readings from Last 1 Encounters:  01/21/18 114/66   No changes needed Most recent labs reviewed  Disc lifstyle change with low sodium diet and exercise

## 2018-01-21 NOTE — Assessment & Plan Note (Signed)
Rev past anesthesia and surg experience For R TKA and did well with the L one prev  Needs clearance from pulmonary and cardiology before she can set date  Rev med all and intolerances  Chronic conditions are stable  Has had c diff in the past -not lately /unsure if she will go to rehab facility

## 2018-01-21 NOTE — Assessment & Plan Note (Signed)
Lab Results  Component Value Date   HGBA1C 5.6 11/27/2017   reassuring

## 2018-01-21 NOTE — Progress Notes (Signed)
Subjective:    Patient ID: [name], female    DOB: 11-05-1950, 67 y.o.   MRN: [id number]  HPI Here for cough and nasal congestion  Some prod cough- just a little phlegm/yellow and thick  Not wheezing Inhaler infrequent  Some sore throat -poss from mouth breathing and cpap No sinus pain  Hx of bronchiectasis    Also 5 week f/u of depression  Last visit started wellbutrin xl 150 mg daily  Also was to start counseling in the interim (going ok - is a bit expensive for her)  Her counselor will not take her insurance  Thinks she is improving  Still going through the motions - and wants to isolate herself at [employer] of her grand daughter   Also form for med clearance for ortho surgery  (will have separate clearance from cardiol and pulm)  R TKA-- unsure when she will plan it  Dr Vickey Huger  Wt Readings from Last 3 Encounters:  01/21/18 215 lb 8 oz (97.8 kg)  01/16/18 218 lb (98.9 kg)  01/06/18 216 lb (98 kg)   34.01 kg/m   BP Readings from Last 3 Encounters:  01/21/18 114/66  01/16/18 124/68  01/06/18 (!) 130/54    preop clearance  No problems with anesthesia - would prefer a spinal to general  Allergic to ace inhibitors Also dilantin  Rash from carbamazapine  Itching from codeine  Rash from phenytoin   Lab Results  Component Value Date   HGBA1C 5.6 11/27/2017     No problems with other pain medicine (she tolerates tramadol for knee pain occ)  Has had c diff in the past- careful about that   No cp or palpitations lately  bp is well controlled (Dr Caryl Comes lowered her medicine)  Multiple surgeries in the past  Has had other knee done- glad she did it and her knee is wonderful    Patient Active Problem List   Diagnosis Date Noted  . Pre-op exam 01/21/2018  . Neck pain 10/25/2017  . Paresthesias in left hand 10/25/2017  . Dry nose 07/19/2017  . Paresthesia of foot, bilateral 04/12/2017  . Itching 02/22/2017  . Primary  osteoarthritis of both hands 09/27/2016  . Primary osteoarthritis of both knees 09/27/2016  . Urinary frequency 09/26/2016  . Screen for STD (sexually transmitted disease) 09/26/2016  . 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  . Nasopharyngitis, 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/2016  . Postnasal drip 05/18/2016  . S/P total knee replacement 02/13/2016  . Chronic pain of both knees 12/06/2015  . Globus pharyngeus 05/17/2015  . Productive cough 05/17/2015  . Genetic testing 12/23/2014  . Family history of breast cancer   . Family history of colon cancer   . Family history of pancreatic cancer   . Breast cancer of upper-outer quadrant of right female breast ([place]) 11/23/2014  . Allergy to ACE inhibitors 09/27/2014  . Encounter for Medicare annual wellness exam 06/27/2014  . Hyperglycemia 06/23/2013  . Routine general medical examination at a health care facility 06/23/2013  . RLS (restless legs syndrome) 01/15/2013  . Hx of Clostridium difficile infection 10/10/2012  . Bronchiectasis without acute exacerbation ([place]) 04/29/2012  . Dyspnea on exertion 04/02/2012  . Non-ischemic cardiomyopathy ([place]) 03/07/2012  . Colon cancer screening 01/11/2012  . Post-menopausal 01/11/2012  . Chest pain 11/07/2011  . Palpitations 04/17/2011  . Abnormal tympanic membrane 01/22/2011  . Obstructive sleep apnea 04/04/2010  . VENTRICULAR TACHYCARDIA 01/03/2009  . ICD  [employer]  Single  chamber 01/03/2009  . PATENT FORAMEN OVALE 09/29/2008  . Vitamin D deficiency 08/17/2008  . Hypothyroidism 11/18/2006  . Hyperlipidemia 11/18/2006  . Depressed mood 11/18/2006  . Essential hypertension 11/18/2006  . MITRAL VALVE PROLAPSE 11/18/2006  . CEREBRAL ANEURYSM 11/18/2006  . Allergic rhinitis 11/18/2006  . GERD 11/18/2006  . DIVERTICULOSIS, COLON 11/18/2006  . Fatty liver 11/18/2006  . Fibromyalgia 11/18/2006   Past Medical History:  Diagnosis Date  . AICD (automatic cardioverter/defibrillator) present   .  Anxiety   . Arthritis   . 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 Cerebral aneurysm, nonruptured    had a clip put in  . Clostridium difficile infection   . Depressive disorder, not elsewhere classified   . Diverticulosis of colon (without mention of hemorrhage)   . Esophageal reflux   . Fibromyalgia   . Gastritis   . GERD (gastroesophageal reflux disease)   . GI bleed 2004  . Hiatal hernia   . Hyperlipidemia   . Hypertension   . Hypothyroidism   . Internal hemorrhoids   . Obstructive sleep apnea (adult) (pediatric)   . Ostium secundum type atrial septal defect   . Other chronic nonalcoholic liver disease   . Other pulmonary embolism and infarction   . Paroxysmal ventricular tachycardia ([place])   . Personal history of radiation therapy   . Pneumonia    history  . Presence of permanent cardiac pacemaker   . PUD (peptic ulcer disease)   . Radiation 02/03/15-03/10/15   Right Breast  . Sarcoid    per pt , not sure  . Schatzki's ring   . Stroke [facility]) 2013   tia/ pt feels it was around 2008 0r 2009  . Takotsubo syndrome   . Tubular adenoma of colon   . Unspecified transient cerebral ischemia   . Unspecified vitamin D deficiency    Past Surgical History:  Procedure Laterality Date  . ABDOMINAL HYSTERECTOMY    . ABI  2006   normal  . BREAST EXCISIONAL BIOPSY    . BREAST LUMPECTOMY     right breast 2016  . BREAST LUMPECTOMY WITH RADIOACTIVE SEED LOCALIZATION Right 01/03/2015   Procedure: BREAST LUMPECTOMY WITH RADIOACTIVE SEED LOCALIZATION;  Surgeon: Autumn Messing III, MD;  Location: [place];  Service: General;  Laterality: Right;  . CARDIAC DEFIBRILLATOR PLACEMENT  2006; 2012   BSX single chamber ICD  . carotid dopplers  2006   neg  . CEREBRAL ANEURYSM REPAIR  02/1999  . COLONOSCOPY    . hospitalization  2004   GI bleed, PUD, diverticulosis (EGD,colonscopy)  . hospitalization     PE, NSVT, s/p defib  . JOINT REPLACEMENT  2017   left knee   . KNEE  ARTHROSCOPY     bilateral  . LEFT HEART CATHETERIZATION WITH CORONARY ANGIOGRAM N/A 02/22/2012   Procedure: LEFT HEART CATHETERIZATION WITH CORONARY ANGIOGRAM;  Surgeon: Burnell Blanks, MD;  Location: [facility] CATH LAB;  Service: Cardiovascular;  Laterality: N/A;  . PARTIAL HYSTERECTOMY     Fibroids  . TONGUE BIOPSY  12/12/2017   due to sore tongue and white patches/abnormal cells  . TOTAL KNEE ARTHROPLASTY Left 02/13/2016   Procedure: TOTAL KNEE ARTHROPLASTY;  Surgeon: Vickey Huger, MD;  Location: [place];  Service: Orthopedics;  Laterality: Left;  . UPPER GASTROINTESTINAL ENDOSCOPY     Social History   Tobacco Use  . Smoking status: Former Smoker    Packs/day: 1.00    Years: 15.00  Pack years: 15.00    Types: Cigarettes    Last attempt to quit: 08/20/1989    Years since quitting: 28.4  . Smokeless tobacco: Never Used  . Tobacco comment: Started at 73; less than 1 PPD  Substance Use Topics  . Alcohol use: Yes    Alcohol/week: 0.0 oz    Comment: rare  . Drug use: No   Family History  Problem Relation Age of Onset  . Diabetes Mother   . Alzheimer's disease Mother   . Other Brother        Thyroid problem 10/2016  . Allergies Sister   . Heart disease Sister   . Heart disease Brother   . Prostate cancer Brother 80       same brother as throat cancer  . Throat cancer Brother        dx in his 70s; also a smoker  . Heart disease Father   . Cancer Maternal Grandmother 53       colon cancer or abdominal cancer  . Lung cancer Maternal Grandfather 61  . Breast cancer Maternal Aunt 26  . Colon cancer Maternal Aunt 50       same sister as breast at 104  . Lung cancer Maternal Uncle   . Breast cancer Maternal Aunt        dx in her 63s  . Pancreatic cancer Cousin 19       maternal first cousin  . Breast cancer Cousin        paternal first cousin twice removed died in her 26s  . Cardiomyopathy Unknown        Family history  . Heart disease Son        Cardiac Arrest 07/2016  .  Stroke Neg Hx    Allergies  Allergen Reactions  . Ace Inhibitors Swelling    Angioedema; makes tongue "break out" Angioedema; makes tongue "break out"  . Dilantin [Phenytoin Sodium Extended] Rash    Severe rash  . Atorvastatin Other (See Comments)    SEVERE MYALGIAS, MUSCLE PAIN SEVERE MYALGIAS, MUSCLE PAIN  . Ramipril Other (See Comments)    TONGUE ULCERS TONGUE ULCERS  . Rosuvastatin Other (See Comments)    MYALGIAS, SEVERE MUSCLE PAIN MYALGIAS, SEVERE MUSCLE PAIN  . Iodine-131 Other (See Comments)    Swelling at IV site only, no swelling or SOB around mouth.!  . Paroxetine Other (See Comments)    UNKNOWN UNKNOWN  . Amitriptyline Hcl Other (See Comments)     makes her too sleepy!  . Carbamazepine Rash    REACTION: ? reaction REACTION: ? reaction  . Cetirizine Hcl Other (See Comments)    REACTION: sleepy  . Codeine Itching  . Phenytoin Sodium Extended Rash   Current Outpatient Medications on File Prior to Visit  Medication Sig Dispense Refill  . acetaminophen (TYLENOL) 500 MG tablet Take 500 mg by mouth every 6 (six) hours as needed (pain).     [name] albuterol (VENTOLIN HFA) 108 (90 Base) MCG/ACT inhaler Inhale 2 puffs into the lungs every 6 (six) hours as needed for wheezing or shortness of breath. 1 Inhaler 5  . aspirin 81 MG tablet Take 81 mg by mouth daily.    . baclofen (LIORESAL) 10 MG tablet TAKE 1 TABLET THREE TIMES DAILY 90 tablet 3  . buPROPion (WELLBUTRIN XL) 150 MG 24 hr tablet Take 1 tablet (150 mg total) by mouth daily. 30 tablet 11  . calcium carbonate (OSCAL) 1500 (600 Ca) MG TABS tablet Take  600 mg of elemental calcium by mouth daily with breakfast.    . carvedilol (COREG) 12.5 MG tablet Take 0.5 tablets (6.25 mg total) by mouth 2 (two) times daily. 90 tablet 3  . cetirizine (ZYRTEC) 10 MG tablet Take 10 mg by mouth daily. Take one prn alternating with Allegra    . cholecalciferol (VITAMIN D) 1000 UNITS tablet Take 5,000 Units by mouth daily.     .  fexofenadine (ALLEGRA) 180 MG tablet Take 180 mg by mouth daily as needed for allergies or rhinitis.    . fluticasone (FLONASE) 50 MCG/ACT nasal spray Place 2 sprays into both nostrils daily as needed for allergies or rhinitis. 48 g 3  . hydrochlorothiazide (HYDRODIURIL) 12.5 MG tablet Take 1 tablet (12.5 mg total) by mouth daily. 90 tablet 3  . latanoprost (XALATAN) 0.005 % ophthalmic solution Place 1 drop into both eyes at bedtime.    [name] levothyroxine (SYNTHROID, LEVOTHROID) 50 MCG tablet Take 1 tablet (50 mcg total) by mouth daily. 90 tablet 3  . mometasone (ELOCON) 0.1 % lotion as needed.     . montelukast (SINGULAIR) 10 MG tablet TAKE 1 TABLET AT BEDTIME 90 tablet 3  . Multiple Vitamins-Minerals (MULTIVITAMIN PO) Take 1 tablet by mouth daily.    . NON FORMULARY HEMP/CBD OIL -takes a few drops or a pill prn    . pantoprazole (PROTONIX) 40 MG tablet Take 1 tablet (40 mg total) by mouth daily. 30 tablet 11  . polyethylene glycol powder (GLYCOLAX/MIRALAX) powder Take 17 grams by mouth daily (Patient taking differently: Take 17 grams by mouth PRN) 1080 g 0  . potassium chloride (K-DUR) 10 MEQ tablet TAKE 1/2 TABLET (5MEQ TOTAL) EVERY DAY 45 tablet 3  . simvastatin (ZOCOR) 40 MG tablet TAKE 1/2 TABLET  (20  MG  TOTAL) EVERY DAY 45 tablet 3  . traMADol (ULTRAM) 50 MG tablet Take 1 tablet (50 mg total) by mouth at bedtime. (Patient taking differently: Take 50 mg by mouth as needed. ) 30 tablet 1  . zolpidem (AMBIEN) 10 MG tablet Take 1 tablet (10 mg total) by mouth at bedtime as needed. for sleep (Patient taking differently: Take 10 mg by mouth at bedtime. for sleep) 90 tablet 1   Current Facility-Administered Medications on File Prior to Visit  Medication Dose Route Frequency Provider Last Rate Last Dose  . 0.9 %  sodium chloride infusion  500 mL Intravenous Once Pyrtle, Lajuan Lines, MD        Review of Systems  Constitutional: Negative for activity change, appetite change, fatigue, fever and unexpected  weight change.  HENT: Positive for postnasal drip and rhinorrhea. Negative for congestion, ear pain, sinus pressure and sore throat.   Eyes: Negative for pain, redness and visual disturbance.  Respiratory: Positive for cough. Negative for shortness of breath, wheezing and stridor.   Cardiovascular: Negative for chest pain and palpitations.  Gastrointestinal: Negative for abdominal pain, blood in stool, constipation and diarrhea.  Endocrine: Negative for polydipsia and polyuria.  Genitourinary: Negative for dysuria, frequency and urgency.  Musculoskeletal: Positive for arthralgias. Negative for back pain and myalgias.       R knee pain  Skin: Negative for pallor and rash.  Allergic/Immunologic: Negative for environmental allergies.  Neurological: Negative for dizziness, syncope and headaches.  Hematological: Negative for adenopathy. Does not bruise/bleed easily.  Psychiatric/Behavioral: Positive for dysphoric mood. Negative for decreased concentration and suicidal ideas. The patient is nervous/anxious.        Objective:   Physical Exam  Constitutional: She appears well-developed and well-nourished. No distress.  obese and well appearing   HENT:  Head: Normocephalic and atraumatic.  Mouth/Throat: Oropharynx is clear and moist. No oropharyngeal exudate.  Nares are injected and congested  No sinus tenderness Some clear pnd  Eyes: Pupils are equal, round, and reactive to light. Conjunctivae and EOM are normal. Right eye exhibits no discharge. Left eye exhibits no discharge. No scleral icterus.  Neck: Normal range of motion. Neck supple. No JVD present. Carotid bruit is not present. No thyromegaly present.  Cardiovascular: Normal rate, regular rhythm, normal heart sounds and intact distal pulses. Exam reveals no gallop.  Pulmonary/Chest: Effort normal and breath sounds normal. No stridor. No respiratory distress. She has no wheezes. She has no rales. She exhibits no tenderness.  Harsh  bs No rales or rhonchi Some dry crackles at bases  Abdominal: Soft. Bowel sounds are normal. She exhibits no distension, no abdominal bruit and no mass. There is no tenderness.  Musculoskeletal: She exhibits no edema.  Limited rom R knee  Lymphadenopathy:    She has no cervical adenopathy.  Neurological: She is alert. She has normal reflexes. She displays normal reflexes. No cranial nerve deficit. She exhibits normal muscle tone. Coordination normal.  Skin: Skin is warm and dry. No rash noted.  Psychiatric: Her behavior is normal. Thought content normal. Her mood appears anxious.  Affect improved Mildly anx Seems fatigued           Assessment & Plan:   Problem List Items Addressed This Visit      Cardiovascular and Mediastinum   Essential hypertension    bp in fair control at this time  BP Readings from Last 1 Encounters:  01/21/18 114/66   No changes needed Most recent labs reviewed  Disc lifstyle change with low sodium diet and exercise          Respiratory   Bronchiectasis without acute exacerbation ([place])    Now with prod cough/yellow sputum Otherwise feels ok  Reassuring exam  Cover with doxycycline  F/u with pulm soon        Musculoskeletal and Integument   Primary osteoarthritis of both knees    Planning R TKA L one went very well        Other   Depressed mood - Primary    Difficult social situation  Some improvement with wellbutrin xl 150 mg  Tolerates well  Ins does not cover her counselor- we will set her up with one here  Reviewed stressors/ coping techniques/symptoms/ support sources/ tx options and side effects in detail today Continue to follow      Relevant Orders   Ambulatory referral to Psychology   Hyperglycemia    Lab Results  Component Value Date   HGBA1C 5.6 11/27/2017   reassuring      Pre-op exam    Rev past anesthesia and surg experience For R TKA and did well with the L one prev  Needs clearance from pulmonary and  cardiology before she can set date  Rev med all and intolerances  Chronic conditions are stable  Has had c diff in the past -not lately /unsure if she will go to rehab facility      Productive cough    In setting of chronic bronchiectasis  Cover with doxycycline  Exam is reassuring  Will f/u with pulm

## 2018-01-21 NOTE — Assessment & Plan Note (Addendum)
In setting of chronic bronchiectasis  Cover with doxycycline  Exam is reassuring  Will f/u with pulm

## 2018-01-21 NOTE — Assessment & Plan Note (Signed)
Planning R TKA L one went very well

## 2018-01-21 NOTE — Assessment & Plan Note (Signed)
Difficult social situation  Some improvement with wellbutrin xl 150 mg  Tolerates well  Ins does not cover her counselor- we will set her up with one here  Reviewed stressors/ coping techniques/symptoms/ support sources/ tx options and side effects in detail today Continue to follow

## 2018-01-23 ENCOUNTER — Ambulatory Visit
Admission: RE | Admit: 2018-01-23 | Discharge: 2018-01-23 | Disposition: A | Discharge status: Home / Self Care | Payer: Medicare Other | Source: Ambulatory Visit | Attending: Neurology | Admitting: Neurology

## 2018-01-23 DIAGNOSIS — M79602 Pain in left arm: Secondary | ICD-10-CM

## 2018-01-24 ENCOUNTER — Telehealth: Payer: Self-pay | Admitting: Neurology

## 2018-01-24 NOTE — Telephone Encounter (Signed)
  I called the patient.  The CT of the neck shows a possibility of right C5 nerve root impingement but the patient is having symptoms on the left simulating a C6 nerve root problem, the patient does have arthrodesis and ankylosis at the C5-6 level that appears to be chronic.  The patient continues to have symptoms down the left arm.  EMG and nerve conduction study is pending, this is an important study to determine if there is nerve root injury on the left not visible on CT.  No severe spinal stenosis is seen.  I discussed this with the patient.  CT Cervical 01/23/18:   IMPRESSION: 1. Chronic C5-C6 solid arthrodesis/ankylosis. 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 2. Progressed upper cervical disc, endplate, and facet degeneration since a 2009 CTA. -moderate spinal stenosis at C4-C5. Mild spinal stenosis at C3-C4, C6-C7. - up to moderate bilateral C4 and severe right C5 foraminal stenosis. 3. No acute osseous abnormality identified.

## 2018-01-28 ENCOUNTER — Telehealth: Payer: Self-pay | Admitting: Internal Medicine

## 2018-01-28 NOTE — Telephone Encounter (Signed)
Patient states she is having upper and right side abd pain. Pt just had colon 5.20.19. Pt would like advice.

## 2018-01-28 NOTE — Telephone Encounter (Signed)
Pt states she has been having pain in her stomach and Right side. States it feels like "ulcer pain." Reports she had an ulcer years ago. Pt requesting to be seen. Pt scheduled to see Ellouise Newer PA 01/30/18@2 :45pm. Pt aware of apt.

## 2018-01-28 NOTE — Telephone Encounter (Signed)
Left message for pt to call back  °

## 2018-01-30 ENCOUNTER — Ambulatory Visit (INDEPENDENT_AMBULATORY_CARE_PROVIDER_SITE_OTHER): Payer: Medicare Other | Admitting: Physician Assistant

## 2018-01-30 ENCOUNTER — Ambulatory Visit (INDEPENDENT_AMBULATORY_CARE_PROVIDER_SITE_OTHER): Payer: Medicare Other | Admitting: Psychology

## 2018-01-30 ENCOUNTER — Encounter: Payer: Self-pay | Admitting: Physician Assistant

## 2018-01-30 ENCOUNTER — Other Ambulatory Visit (INDEPENDENT_AMBULATORY_CARE_PROVIDER_SITE_OTHER): Payer: Medicare Other

## 2018-01-30 ENCOUNTER — Telehealth: Payer: Self-pay | Admitting: Pulmonary Disease

## 2018-01-30 VITALS — BP 124/58 | HR 71 | Ht 66.5 in | Wt 216.0 lb

## 2018-01-30 DIAGNOSIS — R1013 Epigastric pain: Secondary | ICD-10-CM | POA: Diagnosis not present

## 2018-01-30 DIAGNOSIS — K31A0 Gastric intestinal metaplasia, unspecified: Secondary | ICD-10-CM

## 2018-01-30 DIAGNOSIS — F329 Major depressive disorder, single episode, unspecified: Secondary | ICD-10-CM

## 2018-01-30 DIAGNOSIS — R1031 Right lower quadrant pain: Secondary | ICD-10-CM

## 2018-01-30 DIAGNOSIS — K3189 Other diseases of stomach and duodenum: Secondary | ICD-10-CM

## 2018-01-30 LAB — COMPREHENSIVE METABOLIC PANEL
ALT: 39 U/L — AB (ref 0–35)
AST: 36 U/L (ref 0–37)
Albumin: 4.2 g/dL (ref 3.5–5.2)
Alkaline Phosphatase: 115 U/L (ref 39–117)
BILIRUBIN TOTAL: 0.4 mg/dL (ref 0.2–1.2)
BUN: 9 mg/dL (ref 6–23)
CALCIUM: 10.1 mg/dL (ref 8.4–10.5)
CO2: 29 meq/L (ref 19–32)
CREATININE: 0.8 mg/dL (ref 0.40–1.20)
Chloride: 103 mEq/L (ref 96–112)
GFR: 91.37 mL/min (ref >60.00)
GLUCOSE: 90 mg/dL (ref 70–99)
Potassium: 3.5 mEq/L (ref 3.5–5.1)
Sodium: 140 mEq/L (ref 135–145)
Total Protein: 7.9 g/dL (ref 6.0–8.3)

## 2018-01-30 MED ORDER — PANTOPRAZOLE SODIUM 40 MG PO TBEC: 40.0000 mg | DELAYED_RELEASE_TABLET | Freq: Two times a day (BID) | ORAL | 1 refills | Status: DC

## 2018-01-30 MED ORDER — PREDNISONE 50 MG PO TABS: 50.0000 mg | ORAL_TABLET | ORAL | 0 refills | Status: DC

## 2018-01-30 NOTE — Patient Instructions (Addendum)
Try using Miralax every day 1/4-1/2 dose, increase as needed.   Increase Pantoprazole to 40 mg twice a day 30-60 minutes before meals for 1 months.   Since you are allergic to one or more components in IV contrast, we have sent a prescription for (3) 50 mg tablets of Prednisone to your pharmacy as a Pre-Medication Prep for your upcoming procedure requiring contrast.    Take (1) 50 mg tablet of prednisone 13 hours prior to your procedure at 1:30 am.   Take (1) 50 mg tablet 7 hours prior to your procedure at 7:30 am.    Take (1) 50 mg tablet 1 hour prior to your procedure at 1:30 pm.    You also need to take 50 mg of Benadryl 1 hour prior to your procedure at 1:30 pm.  If you have 25 mg tablets of Benadryl, which can be purchased over the counter, you may take 2 tablets.  Otherwise we can send a prescription for (1) 50 mg tablet of Benadryl to your pharmacy.     You have been scheduled for a CT scan of the abdomen and pelvis at [place] ([address]---this is in the same building as Press [facility]).   You are scheduled on 02-11-18 at 2:30 pm. You should arrive 15 minutes prior to your appointment time for registration. Please follow the written instructions below on the day of your exam:  WARNING: IF YOU ARE ALLERGIC TO IODINE/X-RAY DYE, PLEASE NOTIFY RADIOLOGY IMMEDIATELY AT [phone]! YOU WILL BE GIVEN A 13 HOUR PREMEDICATION PREP.  1) Do not eat anything after 10:30 am (4 hours prior to your test) 2) You have been given 2 bottles of oral contrast to drink. The solution may taste better if refrigerated, but do NOT add ice or any other liquid to this solution. Shake well before drinking.    Drink 1 bottle of contrast @ 12:30 pm (2 hours prior to your exam)  Drink 1 bottle of contrast @ 1:30 pm (1 hour prior to your exam)  You may take any medications as prescribed with a small amount of water except for the following: Metformin, Glucophage, Glucovance, Avandamet,  Riomet, Fortamet, Actoplus Met, Janumet, Glumetza or Metaglip. The above medications must be held the day of the exam AND 48 hours after the exam.  The purpose of you drinking the oral contrast is to aid in the visualization of your intestinal tract. The contrast solution may cause some diarrhea. Before your exam is started, you will be given a small amount of fluid to drink. Depending on your individual set of symptoms, you may also receive an intravenous injection of x-ray contrast/dye. Plan on being at [facility] for 30 minutes or longer, depending on the type of exam you are having performed.  This test typically takes 30-45 minutes to complete.  If you have any questions regarding your exam or if you need to reschedule, you may call the CT department at [phone] between the hours of 8:00 am and 5:00 pm, Monday-Friday.  ________________________________________________________________________

## 2018-01-30 NOTE — Telephone Encounter (Signed)
Received forms from VS folder up front and placed them in his look at folder. VS please advise.

## 2018-01-30 NOTE — Telephone Encounter (Signed)
She asked this just be faxed to Dr. Ruel Favors office once completed.

## 2018-01-30 NOTE — Progress Notes (Addendum)
Chief Complaint:  Abdominal Pain, Constipation  HPI:  [name] is a 67 y/o AA female, who follows with Dr. Hilarie Fredrickson, with past medical history as listed below, who was referred to me by Abner Greenspan, MD for a complaint of abdominal pain and constipation.      03/20/2017 office visit described 6 months of a right-sided abdominal pain unrelated to a bowel movement or eating.  Laying down did help the pain.  Not associated nausea or vomiting.  Was using Pantoprazole 40 mg twice daily.  Reviewed patient had reflux with a history of atrophic gastritis with metaplasia and her Pantoprazole was decreased to 40 mg once daily, surveillance EGD was recommended in May 2020.  Mild constipation was recommended continue MiraLAX 17 g as needed.  Right-sided pain of unclear etiology.  CT of the abdomen pelvis with contrast was ordered for further eval.  Also liver enzymes are slightly elevated July 2018.  It does not appear patient never had CT.    11/27/2017 CMP with an ALT of 42, normal AST.  Previously 02/27/2017 AST 36 and ALT 39.    01/06/2018 colonoscopy Dr. Hilarie Fredrickson with two 2-3 mm polyps in the ascending colon removed with cold biopsy, 1 5 mm polyp in transverse colon removed with cold snare and severe diverticulosis from the a sending to sigmoid colon.  Pathology revealed tubular adenoma.  Repeat recommended in 5 years.    Today, explains continues with a right lower quadrant chronic pain which she has had for at least 2 years.  This pain is constant rated as a 2/10, nothing seems to make this pain any worse but laying down does tend to make this pain feels somewhat better, there is no change with eating or bowel movements.  Patient describes having x-ray in the distant past for this pain and being told she was constipated.    Describes constipation which is chronic for her, typically uses as needed MiraLAX which is once every 3 days or so but tells me that occasionally she does not feel completely empty.    New  epigastric discomfort which "almost feels like an ulcer".  Dull pain with some associated nausea, no increase in heartburn or reflux.  Also sharp pain which will sometimes radiate up into her chest and last for a minute or so but this is rare.  Continues on her Pantoprazole 40 mg daily as recommended at time of last visit.    Denies fever, chills, weight loss, blood in her stool, melena or symptoms that awaken her from sleep.  Past Medical History:  Diagnosis Date  . AICD (automatic cardioverter/defibrillator) present   . Anxiety   . Arthritis   . 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 Cerebral aneurysm, nonruptured    had a clip put in  . Clostridium difficile infection   . Depressive disorder, not elsewhere classified   . Diverticulosis of colon (without mention of hemorrhage)   . Esophageal reflux   . Fibromyalgia   . Gastritis   . GERD (gastroesophageal reflux disease)   . GI bleed 2004  . Hiatal hernia   . Hyperlipidemia   . Hypertension   . Hypothyroidism   . Internal hemorrhoids   . Obstructive sleep apnea (adult) (pediatric)   . Ostium secundum type atrial septal defect   . Other chronic nonalcoholic liver disease   . Other pulmonary embolism and infarction   . Paroxysmal ventricular tachycardia ([place])   .  Personal history of radiation therapy   . Pneumonia    history  . Presence of permanent cardiac pacemaker   . PUD (peptic ulcer disease)   . Radiation 02/03/15-03/10/15   Right Breast  . Sarcoid    per pt , not sure  . Schatzki's ring   . Stroke [facility]) 2013   tia/ pt feels it was around 2008 0r 2009  . Takotsubo syndrome   . Tubular adenoma of colon   . Unspecified transient cerebral ischemia   . Unspecified vitamin D deficiency     Past Surgical History:  Procedure Laterality Date  . ABDOMINAL HYSTERECTOMY    . ABI  2006   normal  . BREAST EXCISIONAL BIOPSY    . BREAST LUMPECTOMY     right breast 2016  . BREAST LUMPECTOMY  WITH RADIOACTIVE SEED LOCALIZATION Right 01/03/2015   Procedure: BREAST LUMPECTOMY WITH RADIOACTIVE SEED LOCALIZATION;  Surgeon: Autumn Messing III, MD;  Location: [facility];  Service: General;  Laterality: Right;  . CARDIAC DEFIBRILLATOR PLACEMENT  2006; 2012   BSX single chamber ICD  . carotid dopplers  2006   neg  . CEREBRAL ANEURYSM REPAIR  02/1999  . COLONOSCOPY    . hospitalization  2004   GI bleed, PUD, diverticulosis (EGD,colonscopy)  . hospitalization     PE, NSVT, s/p defib  . JOINT REPLACEMENT  2017   left knee   . KNEE ARTHROSCOPY     bilateral  . LEFT HEART CATHETERIZATION WITH CORONARY ANGIOGRAM N/A 02/22/2012   Procedure: LEFT HEART CATHETERIZATION WITH CORONARY ANGIOGRAM;  Surgeon: Burnell Blanks, MD;  Location[facility] CATH LAB;  Service: Cardiovascular;  Laterality: N/A;  . PARTIAL HYSTERECTOMY     Fibroids  . TONGUE BIOPSY  12/12/2017   due to sore tongue and white patches/abnormal cells  . TOTAL KNEE ARTHROPLASTY Left 02/13/2016   Procedure: TOTAL KNEE ARTHROPLASTY;  Surgeon: Vickey Huger, MD;  Location: [facility];  Service: Orthopedics;  Laterality: Left;  . UPPER GASTROINTESTINAL ENDOSCOPY      Current Outpatient Medications  Medication Sig Dispense Refill  . acetaminophen (TYLENOL) 500 MG tablet Take 500 mg by mouth every 6 (six) hours as needed (pain).     [name] albuterol (VENTOLIN HFA) 108 (90 Base) MCG/ACT inhaler Inhale 2 puffs into the lungs every 6 (six) hours as needed for wheezing or shortness of breath. 1 Inhaler 5  . aspirin 81 MG tablet Take 81 mg by mouth daily.    . baclofen (LIORESAL) 10 MG tablet TAKE 1 TABLET THREE TIMES DAILY (Patient taking differently: TAKE 1 TABLET THREE TIMES DAILY as needed) 90 tablet 3  . buPROPion (WELLBUTRIN XL) 150 MG 24 hr tablet Take 1 tablet (150 mg total) by mouth daily. 30 tablet 11  . calcium carbonate (OSCAL) 1500 (600 Ca) MG TABS tablet Take 600 mg of elemental calcium by mouth daily with breakfast.    . carvedilol (COREG) 12.5  MG tablet Take 0.5 tablets (6.25 mg total) by mouth 2 (two) times daily. 90 tablet 3  . cetirizine (ZYRTEC) 10 MG tablet Take 10 mg by mouth daily. Take one prn alternating with Allegra    . cholecalciferol (VITAMIN D) 1000 UNITS tablet Take 2,000 Units by mouth daily.     . fexofenadine (ALLEGRA) 180 MG tablet Take 180 mg by mouth daily as needed for allergies or rhinitis.    . fluticasone (FLONASE) 50 MCG/ACT nasal spray Place 2 sprays into both nostrils daily as needed for allergies or rhinitis.  48 g 3  . hydrochlorothiazide (HYDRODIURIL) 12.5 MG tablet Take 1 tablet (12.5 mg total) by mouth daily. 90 tablet 3  . latanoprost (XALATAN) 0.005 % ophthalmic solution Place 1 drop into both eyes at bedtime.    [name] levothyroxine (SYNTHROID, LEVOTHROID) 50 MCG tablet Take 1 tablet (50 mcg total) by mouth daily. 90 tablet 3  . mometasone (ELOCON) 0.1 % lotion as needed.     . montelukast (SINGULAIR) 10 MG tablet TAKE 1 TABLET AT BEDTIME 90 tablet 3  . Multiple Vitamins-Minerals (MULTIVITAMIN PO) Take 1 tablet by mouth daily.    . NON FORMULARY HEMP/CBD OIL -takes a few drops or a pill prn    . pantoprazole (PROTONIX) 40 MG tablet Take 1 tablet (40 mg total) by mouth daily. 30 tablet 11  . polyethylene glycol powder (GLYCOLAX/MIRALAX) powder Take 17 grams by mouth daily (Patient taking differently: Take 17 grams by mouth PRN) 1080 g 0  . potassium chloride (K-DUR) 10 MEQ tablet TAKE 1/2 TABLET (5MEQ TOTAL) EVERY DAY 45 tablet 3  . simvastatin (ZOCOR) 40 MG tablet TAKE 1/2 TABLET  (20  MG  TOTAL) EVERY DAY 45 tablet 3  . traMADol (ULTRAM) 50 MG tablet Take 1 tablet (50 mg total) by mouth at bedtime. (Patient taking differently: Take 50 mg by mouth as needed. ) 30 tablet 1  . zolpidem (AMBIEN) 10 MG tablet Take 1 tablet (10 mg total) by mouth at bedtime as needed. for sleep (Patient taking differently: Take 10 mg by mouth at bedtime. for sleep) 90 tablet 1   No current facility-administered medications for  this visit.     Allergies as of 01/30/2018 - Review Complete 01/30/2018  Allergen Reaction Noted  . Ace inhibitors Swelling 02/18/2014  . Dilantin [phenytoin sodium extended] Rash 02/19/2012  . Atorvastatin Other (See Comments) 04/18/2007  . Ramipril Other (See Comments) 12/01/2014  . Rosuvastatin Other (See Comments) 04/18/2007  . Iodine-131 Other (See Comments) 08/24/2011  . Paroxetine Other (See Comments) 04/18/2007  . Amitriptyline hcl Other (See Comments) 04/18/2007  . Carbamazepine Rash 04/18/2007  . Cetirizine hcl Other (See Comments) 04/18/2007  . Codeine Itching 04/18/2007  . Phenytoin sodium extended Rash 02/19/2012    Family History  Problem Relation Age of Onset  . Diabetes Mother   . Alzheimer's disease Mother   . Other Brother        Thyroid problem 10/2016  . Allergies Sister   . Heart disease Sister   . Heart disease Brother   . Prostate cancer Brother 88       same brother as throat cancer  . Throat cancer Brother        dx in his 39s; also a smoker  . Heart disease Father   . Cancer Maternal Grandmother 68       colon cancer or abdominal cancer  . Lung cancer Maternal Grandfather 65  . Breast cancer Maternal Aunt 54  . Colon cancer Maternal Aunt 41       same sister as breast at 64  . Lung cancer Maternal Uncle   . Breast cancer Maternal Aunt        dx in her 24s  . Pancreatic cancer Cousin 83       maternal first cousin  . Breast cancer Cousin        paternal first cousin twice removed died in her 74s  . Cardiomyopathy Unknown        Family history  . Heart disease Son  Cardiac Arrest 07/2016  . Stroke Neg Hx     Social History   Socioeconomic History  . Marital status: Divorced    Spouse name: Not on file  . Number of children: 2  . Years of education: 49  . Highest education level: Not on file  Occupational History  . Occupation: DISABLED  Social Needs  . Financial resource strain: Not on file  . Food insecurity:    Worry:  Not on file    Inability: Not on file  . Transportation needs:    Medical: Not on file    Non-medical: Not on file  Tobacco Use  . Smoking status: Former Smoker    Packs/day: 1.00    Years: 15.00    Pack years: 15.00    Types: Cigarettes    Last attempt to quit: 08/20/1989    Years since quitting: 28.4  . Smokeless tobacco: Never Used  . Tobacco comment: Started at 49; less than 1 PPD  Substance and Sexual Activity  . Alcohol use: Yes    Alcohol/week: 0.0 oz    Comment: rare  . Drug use: No  . Sexual activity: Not Currently  Lifestyle  . Physical activity:    Days per week: Not on file    Minutes per session: Not on file  . Stress: Not on file  Relationships  . Social connections:    Talks on phone: Not on file    Gets together: Not on file    Attends religious service: Not on file    Active member of club or organization: Not on file    Attends meetings of clubs or organizations: Not on file    Relationship status: Not on file  . Intimate partner violence:    Fear of current or ex partner: Not on file    Emotionally abused: Not on file    Physically abused: Not on file    Forced sexual activity: Not on file  Other Topics Concern  . Not on file  Social History Narrative   Lives alone   Caffeine WCB:[employer]   Retired from [employer]   2 children   Divorced   Right handed     Review of Systems:    Constitutional: No weight loss, fever or chills Cardiovascular: No chest pain Respiratory: No SOB Gastrointestinal: See HPI and otherwise negative   Physical Exam:  Vital signs: BP (!) 124/58   Pulse 71   Ht 5' 6.5" (1.689 m)   Wt 216 lb (98 kg)   LMP  (LMP Unknown)   BMI 34.34 kg/m    Constitutional:   Pleasant overweight AA female appears to be in NAD, Well developed, Well nourished, alert and cooperative Respiratory: Respirations even and unlabored. Lungs clear to auscultation bilaterally.   No wheezes, crackles, or rhonchi.  Cardiovascular: Normal S1, S2. No  MRG. Regular rate and rhythm. No peripheral edema, cyanosis or pallor.  Gastrointestinal:  Soft, nondistended, mild epigastric and RLQ ttp. No rebound or guarding. Normal bowel sounds. No appreciable masses or hepatomegaly. Psychiatric: Demonstrates good judgement and reason without abnormal affect or behaviors.  MOST RECENT LABS AND IMAGING: CBC    Component Value Date/Time   WBC 4.1 11/27/2017 1043   RBC 4.42 11/27/2017 1043   HGB 12.9 11/27/2017 1043   HGB 12.4 12/01/2014 0845   HCT 39.2 11/27/2017 1043   HCT 38.8 12/01/2014 0845   PLT 205.0 11/27/2017 1043   PLT 213 12/01/2014 0845   MCV 88.8 11/27/2017 1043  MCV 88.5 12/01/2014 0845   MCH 29.5 02/27/2017 1518   MCHC 33.0 11/27/2017 1043   RDW 12.8 11/27/2017 1043   RDW 12.7 12/01/2014 0845   LYMPHSABS 1.2 11/27/2017 1043   LYMPHSABS 1.0 12/01/2014 0845   MONOABS 0.5 11/27/2017 1043   MONOABS 0.3 12/01/2014 0845   EOSABS 0.2 11/27/2017 1043   EOSABS 0.3 12/01/2014 0845   EOSABS 0.1 03/13/2007 1249   BASOSABS 0.0 11/27/2017 1043   BASOSABS 0.0 12/01/2014 0845    CMP     Component Value Date/Time   NA 140 11/27/2017 1043   NA 141 12/01/2014 0847   K 3.9 11/27/2017 1043   K 3.8 12/01/2014 0847   CL 103 11/27/2017 1043   CO2 31 11/27/2017 1043   CO2 26 12/01/2014 0847   GLUCOSE 109 (H) 11/27/2017 1043   GLUCOSE 175 (H) 12/01/2014 0847   BUN 9 11/27/2017 1043   BUN 10.6 12/01/2014 0847   CREATININE 0.78 11/27/2017 1043   CREATININE 0.82 02/27/2017 1518   CREATININE 0.8 12/01/2014 0847   CALCIUM 9.9 11/27/2017 1043   CALCIUM 9.8 12/01/2014 0847   PROT 7.5 11/27/2017 1043   PROT 7.5 12/01/2014 0847   ALBUMIN 4.1 11/27/2017 1043   ALBUMIN 3.9 12/01/2014 0847   AST 37 11/27/2017 1043   AST 29 12/01/2014 0847   ALT 42 (H) 11/27/2017 1043   ALT 33 12/01/2014 0847   ALKPHOS 100 11/27/2017 1043   ALKPHOS 127 12/01/2014 0847   BILITOT 0.4 11/27/2017 1043   BILITOT 0.42 12/01/2014 0847   GFRNONAA 74 02/27/2017  1518   GFRAA 85 02/27/2017 1518    Assessment: 1.  Chronic right lower quadrant pain: Unclear etiology, patient remains concerned about this, did not follow through with CT as ordered by Dr. Hilarie Fredrickson at last visit in 2018 2.  Epigastric pain: Consider relation to known reflux and atrophic gastritis as below versus other 3.  GERD with history of atrophic gastritis with metaplasia: Repeat EGD recommended May 2020  Plan: 1.  Reordered CT of the abdomen and pelvis with contrast for further evaluation of chronic right lower quadrant pain and new epigastric pain. Patient allergic to iodine and given pre-medication per protocol. Also ordered CMP. For imaging purposes. 2.  Increased Pantoprazole to 40 mg twice daily, 30-60 minutes before breakfast and dinner prescribed #60 with 1 refill.  Discussed patient should stay on this for a month and if she is feeling better can try to back down to once daily dosing again. 3.  Recommend the patient use MiraLAX on a daily basis.  Discussed that she can do this as a little as a 1/4 dose or as much as 4 doses to maintain daily soft bowel movements. 4.  Patient to follow in clinic with me in 4-6 weeks.  If epigastric pain is no better would recommend an EGD for further eval.  Ellouise Newer, PA-C [facility] 01/30/2018, 2:55 PM  Cc: Tower, [name], MD   Addendum: Reviewed and agree with management. Pyrtle, Lajuan Lines, MD

## 2018-02-04 ENCOUNTER — Ambulatory Visit (INDEPENDENT_AMBULATORY_CARE_PROVIDER_SITE_OTHER): Payer: Medicare Other | Admitting: Neurology

## 2018-02-04 ENCOUNTER — Encounter: Payer: Self-pay | Admitting: Neurology

## 2018-02-04 DIAGNOSIS — M47812 Spondylosis without myelopathy or radiculopathy, cervical region: Secondary | ICD-10-CM | POA: Diagnosis not present

## 2018-02-04 DIAGNOSIS — G2581 Restless legs syndrome: Secondary | ICD-10-CM

## 2018-02-04 DIAGNOSIS — M79602 Pain in left arm: Secondary | ICD-10-CM

## 2018-02-04 NOTE — Progress Notes (Signed)
Please refer to EMG and nerve conduction study procedure note. 

## 2018-02-04 NOTE — Telephone Encounter (Signed)
She needs ROV prior to completing form for surgical clearance.  Can be with NP.

## 2018-02-04 NOTE — Telephone Encounter (Signed)
rec'd forms from [facility] [place] for surgery clearance. This form is in VS green folder in cubby; last OV 06-06-18 VS please advise.

## 2018-02-04 NOTE — Progress Notes (Addendum)
EMG nerve conduction study done today was unremarkable, no evidence of a left cervical radiculopathy.  We discussed possibility of doing epidural steroid injection, the patient wishes to undergo physical therapy on the neck first.  She mainly has persistent paresthesias that involve index finger on the left.  She has chronic neck and shoulder discomfort bilaterally.  She has difficulty sleeping at night because of this.     [place]    Nerve / Sites Muscle Latency Ref. Amplitude Ref. Rel Amp Segments Distance Velocity Ref. Area    ms ms mV mV %  cm m/s m/s mVms  R Median - APB     Wrist APB 3.3 ?4.4 9.6 ?4.0 100 Wrist - APB 7   27.8     Upper arm APB 7.6  7.2  74.7 Upper arm - Wrist 22 52 ?49 21.3  L Median - APB     Wrist APB 3.4 ?4.4 8.4 ?4.0 100 Wrist - APB 7   28.0     Upper arm APB 7.5  8.1  96.5 Upper arm - Wrist 22 53 ?49 26.2  R Ulnar - ADM     Wrist ADM 2.3 ?3.3 10.4 ?6.0 100 Wrist - ADM 7   33.0     B.Elbow ADM 6.3  9.7  92.9 B.Elbow - Wrist 20 51 ?49 30.0     A.Elbow ADM 8.1  9.3  96.3 A.Elbow - B.Elbow 10 56 ?49 31.4         A.Elbow - Wrist      L Ulnar - ADM     Wrist ADM 2.5 ?3.3 9.6 ?6.0 100 Wrist - ADM 7   31.5     B.Elbow ADM 6.3  7.2  75 B.Elbow - Wrist 20 53 ?49 27.3     A.Elbow ADM 7.7  7.6  105 A.Elbow - B.Elbow 8 57 ?49 28.4         A.Elbow - Wrist                 SNC    Nerve / Sites Rec. Site Peak Lat Ref.  Amp Ref. Segments Distance    ms ms V V  cm  R Median - Orthodromic (Dig II, Mid palm)     Dig II Wrist 3.0 ?3.4 12 ?10 Dig II - Wrist 13  L Median - Orthodromic (Dig II, Mid palm)     Dig II Wrist 3.2 ?3.4 10 ?10 Dig II - Wrist 13  R Ulnar - Orthodromic, (Dig V, Mid palm)     Dig V Wrist 2.9 ?3.1 7 ?5 Dig V - Wrist 11  L Ulnar - Orthodromic, (Dig V, Mid palm)     Dig V Wrist 2.9 ?3.1 5 ?5 Dig V - Wrist 81              F  Wave    Nerve F Lat Ref.   ms ms  R Ulnar - ADM 28.8 ?32.0

## 2018-02-04 NOTE — Telephone Encounter (Signed)
Kelli, please advise if forms have been taken care of for pt. Thanks!

## 2018-02-04 NOTE — Telephone Encounter (Signed)
Spoke with pt. She is scheduled to see Dr. Halford Chessman on 02/05/18. Clearance can be done then. Nothing further was needed.

## 2018-02-04 NOTE — Procedures (Signed)
     HISTORY:  [name] is a 67 year old patient with a history of chronic neck pain and some paresthesias involving the left index finger.  The patient is being evaluated for possible cervical radiculopathy.  NERVE CONDUCTION STUDIES:  Nerve conduction studies were performed on both upper extremities. The distal motor latencies and motor amplitudes for the median and ulnar nerves were within normal limits. The nerve conduction velocities for these nerves were also normal. The sensory latencies for the median and ulnar nerves were normal.  The F-wave latency for the right ulnar nerve was normal.   EMG STUDIES:  EMG study was performed on the left upper extremity:  The first dorsal interosseous muscle reveals 2 to 4 K units with full recruitment. No fibrillations or positive waves were noted. The abductor pollicis brevis muscle reveals 2 to 4 K units with full recruitment. No fibrillations or positive waves were noted. The extensor indicis proprius muscle reveals 1 to 3 K units with full recruitment. No fibrillations or positive waves were noted. The pronator teres muscle reveals 2 to 3 K units with full recruitment. No fibrillations or positive waves were noted. The biceps muscle reveals 1 to 2 K units with full recruitment. No fibrillations or positive waves were noted. The triceps muscle reveals 2 to 4 K units with full recruitment. No fibrillations or positive waves were noted. The anterior deltoid muscle reveals 2 to 3 K units with full recruitment. No fibrillations or positive waves were noted. The cervical paraspinal muscles were tested at 2 levels. No abnormalities of insertional activity were seen at either level tested. There was good relaxation.   IMPRESSION:  Nerve conduction studies done on both upper extremities were within normal limits.  No evidence of a neuropathy is seen.  EMG evaluation of the left upper extremity is unremarkable without evidence of an overlying  cervical radiculopathy.  Jill Alexanders MD 02/04/2018 1:43 PM  [facility] [address] [place], [place] [address]  Phone [phone] Fax [phone]

## 2018-02-05 ENCOUNTER — Ambulatory Visit (INDEPENDENT_AMBULATORY_CARE_PROVIDER_SITE_OTHER): Payer: Medicare Other | Admitting: Pulmonary Disease

## 2018-02-05 ENCOUNTER — Encounter: Payer: Self-pay | Admitting: Pulmonary Disease

## 2018-02-05 VITALS — BP 110/70 | HR 68 | Ht 66.0 in | Wt 214.6 lb

## 2018-02-05 DIAGNOSIS — J479 Bronchiectasis, uncomplicated: Secondary | ICD-10-CM

## 2018-02-05 DIAGNOSIS — Z9989 Dependence on other enabling machines and devices: Secondary | ICD-10-CM

## 2018-02-05 DIAGNOSIS — G4733 Obstructive sleep apnea (adult) (pediatric): Secondary | ICD-10-CM

## 2018-02-05 DIAGNOSIS — R05 Cough: Secondary | ICD-10-CM

## 2018-02-05 DIAGNOSIS — R058 Other specified cough: Secondary | ICD-10-CM

## 2018-02-05 MED ORDER — FLUTTER DEVI: 1.0000 "application " | Freq: Two times a day (BID) | 0 refills | Status: DC

## 2018-02-05 MED ORDER — ALBUTEROL SULFATE HFA 108 (90 BASE) MCG/ACT IN AERS: 2.0000 | INHALATION_SPRAY | Freq: Four times a day (QID) | RESPIRATORY_TRACT | 3 refills | Status: DC | PRN

## 2018-02-05 MED ORDER — MONTELUKAST SODIUM 10 MG PO TABS: 10.0000 mg | ORAL_TABLET | Freq: Every day | ORAL | 3 refills | Status: DC

## 2018-02-05 NOTE — Progress Notes (Signed)
[facility]  Chief Complaint  Patient presents with  . Follow-up    Pt feels the pressure needs to be changed; feels that she doesn't get enough air at times, and other times has too much pressure. Pt is having hard time with leaks with the mask.    Vital signs: BP 110/70 (BP Location: Left Arm, Cuff Size: Normal)   Pulse 68   Ht 5\' 6"  (1.676 m)   Wt 214 lb 9.6 oz (97.3 kg)   LMP  (LMP Unknown)   SpO2 99%   BMI 34.64 kg/m   History of Present Illness: [name] is a 67 y.o. female former smoker with bronchiectasis and obstructive sleep apnea.  She is being seen by Dr. Ronnie Derby.  She might need surgery.  She has occasional cough.  Not bring up sputum, but feels congested at times.  Uses mucinex.  Using flonase, singulair.  Not needing to use ventolin much.    Physical Exam:  General - pleasant Eyes - pupils reactive ENT - no sinus tenderness, no oral exudate, no LAN Cardiac - regular, no murmur Chest - no wheeze, rales Abd - soft, non tender Ext - no edema Skin - no rashes Neuro - normal strength Psych - normal mood   Assessment/Plan:  Upper airway cough syndrome with allergic rhinitis. - continue singulair, flonase, nasal irrigation, allegra  Obstructive sleep apnea. - she is compliant with CPAP and reports benefit - continue CPAP 7 cm H2O  Bronchiectasis. - prn mucinex, ventolin - add prn flutter valve  Preoperative respiratory assessment. - she can proceed with surgery if needed   Patient Instructions  Will arrange for flutter valve  Follow up in 1 year    Chesley Mires, MD [place] 02/05/2018, 2:00 PM  Flow Sheet  Pulmonary tests: PSG 11/29/08 >> AHI 9 HST 11/10/15 >> AHI 7.1, SaO2 low 75% Auto CPAP 01/05/18 to 02/03/18 >> used on 29 of 30 nights with average 4 hrs 32 min.  Average AHI 5.2 with CPAP 7 cm H2O.  Pulmonary tests CT chest 04/10/12 >> b/l lower lobe cylindrical BTX  and some in upper lobes PFT 04/17/12 >> FEV1 2.60 (110%), FEV1% 85, TLC 4.05 (77%), DLCO 44% CT angio chest 10/26/15 >> patchy GGO in periphery of lungs b/l, basilar BTX, no PE; no significant change compared to 2013 PFT 01/17/17 >> FEV1 2.38 (111%), FEV1% 90, TLC 3.85 (71%), DLCO 58%, no BD  Cardiac tests Echo 05/13/17 >> mild LVH, EF 55 to 60%, grade 1 DD, mild TR  Past Medical History: She  has a past medical history of AICD (automatic cardioverter/defibrillator) present, Anxiety, Arthritis, Breast cancer ([place]) (2016), Cerebral aneurysm, nonruptured, Clostridium difficile infection, Depressive disorder, not elsewhere classified, Diverticulosis of colon (without mention of hemorrhage), Esophageal reflux, Fibromyalgia, Gastritis, GERD (gastroesophageal reflux disease), GI bleed (2004), Hiatal hernia, Hyperlipidemia, Hypertension, Hypothyroidism, Internal hemorrhoids, Obstructive sleep apnea (adult) (pediatric), Ostium secundum type atrial septal defect, Other chronic nonalcoholic liver disease, Other pulmonary embolism and infarction, Paroxysmal ventricular tachycardia ([place]), Personal history of radiation therapy, Pneumonia, Presence of permanent cardiac pacemaker, PUD (peptic ulcer disease), Radiation (02/03/15-03/10/15), Sarcoid, Schatzki's ring, Stroke ([place]) (2013), Takotsubo syndrome, Tubular adenoma of colon, Unspecified transient cerebral ischemia, and Unspecified vitamin D deficiency.  Past Surgical History: She  has a past surgical history that includes Partial hysterectomy; Cerebral aneurysm repair (02/1999); Knee arthroscopy; Cardiac defibrillator placement (2006; 2012); carotid dopplers (2006); hospitalization (2004); hospitalization; ABI (2006); left heart catheterization with coronary  angiogram (N/A, 02/22/2012); Abdominal hysterectomy; Breast lumpectomy with radioactive seed localization (Right, 01/03/2015); Total knee arthroplasty (Left, 02/13/2016); Breast lumpectomy; Breast excisional biopsy;  Joint replacement (2017); Tongue Biopsy (12/12/2017); Colonoscopy; and Upper gastrointestinal endoscopy.  Family History: Her family history includes Allergies in her sister; Alzheimer's disease in her mother; Breast cancer in her cousin and maternal aunt; Breast cancer (age of onset: 58) in her maternal aunt; Cancer (age of onset: 17) in her maternal grandmother; Cardiomyopathy in her unknown relative; Colon cancer (age of onset: 18) in her maternal aunt; Diabetes in her mother; Heart disease in her brother, father, sister, and son; Lung cancer in her maternal uncle; Lung cancer (age of onset: 3) in her maternal grandfather; Other in her brother; Pancreatic cancer (age of onset: 37) in her cousin; Prostate cancer (age of onset: 30) in her brother; Throat cancer in her brother.  Social History: She  reports that she quit smoking about 28 years ago. Her smoking use included cigarettes. She has a 15.00 pack-year smoking history. She has never used smokeless tobacco. She reports that she drinks alcohol. She reports that she does not use drugs.  Medications: Allergies as of 02/05/2018      Reactions   Ace Inhibitors Swelling   Angioedema; makes tongue "break out" Angioedema; makes tongue "break out"   Dilantin [phenytoin Sodium Extended] Rash   Severe rash   Atorvastatin Other (See Comments)   SEVERE MYALGIAS, MUSCLE PAIN SEVERE MYALGIAS, MUSCLE PAIN   Ramipril Other (See Comments)   TONGUE ULCERS TONGUE ULCERS   Rosuvastatin Other (See Comments)   MYALGIAS, SEVERE MUSCLE PAIN MYALGIAS, SEVERE MUSCLE PAIN   Iodine-131 Other (See Comments)   Swelling at IV site only, no swelling or SOB around mouth.!   Paroxetine Other (See Comments)   UNKNOWN UNKNOWN   Amitriptyline Hcl Other (See Comments)    makes her too sleepy!   Carbamazepine Rash   REACTION: ? reaction REACTION: ? reaction   Cetirizine Hcl Other (See Comments)   REACTION: sleepy   Codeine Itching   Phenytoin Sodium Extended  Rash      Medication List        Accurate as of 02/05/18  2:00 PM. Always use your most recent med list.          acetaminophen 500 MG tablet Commonly known as:  TYLENOL Take 500 mg by mouth every 6 (six) hours as needed (pain).   albuterol 108 (90 Base) MCG/ACT inhaler Commonly known as:  VENTOLIN HFA Inhale 2 puffs into the lungs every 6 (six) hours as needed for wheezing or shortness of breath.   aspirin 81 MG tablet Take 81 mg by mouth daily.   baclofen 10 MG tablet Commonly known as:  LIORESAL TAKE 1 TABLET THREE TIMES DAILY   buPROPion 150 MG 24 hr tablet Commonly known as:  WELLBUTRIN XL Take 1 tablet (150 mg total) by mouth daily.   calcium carbonate 1500 (600 Ca) MG Tabs tablet Commonly known as:  OSCAL Take 600 mg of elemental calcium by mouth daily with breakfast.   carvedilol 12.5 MG tablet Commonly known as:  COREG Take 0.5 tablets (6.25 mg total) by mouth 2 (two) times daily.   cetirizine 10 MG tablet Commonly known as:  ZYRTEC Take 10 mg by mouth daily. Take one prn alternating with Allegra   cholecalciferol 1000 units tablet Commonly known as:  VITAMIN D Take 2,000 Units by mouth daily.   fexofenadine 180 MG tablet Commonly known as:  ALLEGRA Take  180 mg by mouth daily as needed for allergies or rhinitis.   fluticasone 50 MCG/ACT nasal spray Commonly known as:  FLONASE Place 2 sprays into both nostrils daily as needed for allergies or rhinitis.   hydrochlorothiazide 12.5 MG tablet Commonly known as:  HYDRODIURIL Take 1 tablet (12.5 mg total) by mouth daily.   latanoprost 0.005 % ophthalmic solution Commonly known as:  XALATAN Place 1 drop into both eyes at bedtime.   levothyroxine 50 MCG tablet Commonly known as:  SYNTHROID, LEVOTHROID Take 1 tablet (50 mcg total) by mouth daily.   mometasone 0.1 % lotion Commonly known as:  ELOCON as needed.   montelukast 10 MG tablet Commonly known as:  SINGULAIR TAKE 1 TABLET AT BEDTIME     MULTIVITAMIN PO Take 1 tablet by mouth daily.   NON FORMULARY HEMP/CBD OIL -takes a few drops or a pill prn   pantoprazole 40 MG tablet Commonly known as:  PROTONIX Take 1 tablet (40 mg total) by mouth 2 (two) times daily before a meal.   polyethylene glycol powder powder Commonly known as:  GLYCOLAX/MIRALAX Take 17 grams by mouth daily   potassium chloride 10 MEQ tablet Commonly known as:  K-DUR TAKE 1/2 TABLET (5MEQ TOTAL) EVERY DAY   predniSONE 50 MG tablet Commonly known as:  DELTASONE Take 1 tablet (50 mg total) by mouth as directed.   PROBIOTIC-10 PO Take by mouth at bedtime.   simvastatin 40 MG tablet Commonly known as:  ZOCOR TAKE 1/2 TABLET  (20  MG  TOTAL) EVERY DAY   traMADol 50 MG tablet Commonly known as:  ULTRAM Take 1 tablet (50 mg total) by mouth at bedtime.   zolpidem 10 MG tablet Commonly known as:  AMBIEN Take 1 tablet (10 mg total) by mouth at bedtime as needed. for sleep

## 2018-02-05 NOTE — Patient Instructions (Signed)
Will arrange for flutter valve  Follow up in 1 year

## 2018-02-11 ENCOUNTER — Ambulatory Visit (INDEPENDENT_AMBULATORY_CARE_PROVIDER_SITE_OTHER)
Admission: RE | Admit: 2018-02-11 | Discharge: 2018-02-11 | Disposition: A | Discharge status: Home / Self Care | Payer: Medicare Other | Source: Ambulatory Visit | Attending: Internal Medicine | Admitting: Internal Medicine

## 2018-02-11 DIAGNOSIS — R109 Unspecified abdominal pain: Secondary | ICD-10-CM | POA: Diagnosis not present

## 2018-02-11 MED ORDER — IOPAMIDOL (ISOVUE-300) INJECTION 61%
100.0000 mL | Freq: Once | INTRAVENOUS | Status: AC | PRN
  Administered 2018-02-11: 100 mL via INTRAVENOUS

## 2018-02-12 ENCOUNTER — Other Ambulatory Visit: Payer: Self-pay | Admitting: *Deleted

## 2018-02-12 DIAGNOSIS — R935 Abnormal findings on diagnostic imaging of other abdominal regions, including retroperitoneum: Secondary | ICD-10-CM

## 2018-02-13 ENCOUNTER — Ambulatory Visit (INDEPENDENT_AMBULATORY_CARE_PROVIDER_SITE_OTHER): Payer: Medicare Other | Admitting: Psychology

## 2018-02-13 DIAGNOSIS — F329 Major depressive disorder, single episode, unspecified: Secondary | ICD-10-CM

## 2018-02-19 ENCOUNTER — Telehealth: Payer: Self-pay | Admitting: Physician Assistant

## 2018-02-19 NOTE — Telephone Encounter (Signed)
A user error has taken place.

## 2018-02-21 ENCOUNTER — Encounter: Payer: Self-pay | Admitting: Family Medicine

## 2018-02-24 MED ORDER — ESCITALOPRAM OXALATE 10 MG PO TABS: 10.0000 mg | ORAL_TABLET | Freq: Every day | ORAL | 11 refills | Status: DC

## 2018-02-24 NOTE — Telephone Encounter (Signed)
Change wellbutrin to lexapro

## 2018-02-27 ENCOUNTER — Ambulatory Visit (INDEPENDENT_AMBULATORY_CARE_PROVIDER_SITE_OTHER): Payer: Medicare Other | Admitting: Psychology

## 2018-02-27 DIAGNOSIS — F3289 Other specified depressive episodes: Secondary | ICD-10-CM

## 2018-03-05 ENCOUNTER — Ambulatory Visit (INDEPENDENT_AMBULATORY_CARE_PROVIDER_SITE_OTHER): Payer: Medicare Other | Admitting: *Deleted

## 2018-03-05 DIAGNOSIS — I472 Ventricular tachycardia, unspecified: Secondary | ICD-10-CM

## 2018-03-05 NOTE — Progress Notes (Signed)
Remote ICD transmission.   

## 2018-03-06 ENCOUNTER — Encounter: Payer: Self-pay | Admitting: Cardiology

## 2018-03-06 ENCOUNTER — Ambulatory Visit (INDEPENDENT_AMBULATORY_CARE_PROVIDER_SITE_OTHER): Payer: Medicare Other | Admitting: Physician Assistant

## 2018-03-06 ENCOUNTER — Encounter: Payer: Self-pay | Admitting: Physician Assistant

## 2018-03-06 VITALS — BP 102/68 | HR 75 | Ht 66.5 in | Wt 219.5 lb

## 2018-03-06 DIAGNOSIS — K219 Gastro-esophageal reflux disease without esophagitis: Secondary | ICD-10-CM | POA: Diagnosis not present

## 2018-03-06 DIAGNOSIS — K5900 Constipation, unspecified: Secondary | ICD-10-CM | POA: Diagnosis not present

## 2018-03-06 DIAGNOSIS — K3189 Other diseases of stomach and duodenum: Secondary | ICD-10-CM

## 2018-03-06 DIAGNOSIS — R1031 Right lower quadrant pain: Secondary | ICD-10-CM

## 2018-03-06 DIAGNOSIS — K31A0 Gastric intestinal metaplasia, unspecified: Secondary | ICD-10-CM

## 2018-03-06 MED ORDER — PANTOPRAZOLE SODIUM 40 MG PO TBEC: 40.0000 mg | DELAYED_RELEASE_TABLET | Freq: Two times a day (BID) | ORAL | 2 refills | Status: DC

## 2018-03-06 NOTE — Progress Notes (Addendum)
Chief Complaint: Follow-up right lower quadrant abdominal pain  HPI:    [name] is a 67 year old female, who follows with Dr. Hilarie Fredrickson, with a past medical history as listed below, who returns clinic today for follow-up of her abdominal pain and constipation.    01/06/2018 colonoscopy Dr. Hilarie Fredrickson with 2 2-3 mm polyps in the ascending colon, 1 5 mm polyp in the transverse colon, severe diverticulosis from ascending to sigmoid colon.  Pathology revealed tubular adenoma.  Repeat recommended 5 years.    01/30/2018 office visit with me described continued right lower quadrant pain which is chronic for her for the past 2 years, constant 2/10.  Also chronic constipation, typically used MiraLAX once every 3 days.  New epigastric discomfort.  At that time chronic right lower quadrant pain was discussed, unclear etiology, patient remained concerned and reordered CT the abdomen pelvis with contrast for further evaluation.  Increase pantoprazole to 40 mg twice a day and discussed she should stay on this for a month and if she is feeling better try to titrate back down.  Recommend she is her MiraLAX on a daily basis.  It was discussed that if epigastric pain is no better she would need EGD.  (Last done 2017 with atrophic gastritis and intestinal metaplasia, repeat recommended 2020 for surveillance)     02/12/2018 CT the abdomen pelvis with contrast showed intra-umbilical abdominal wall hernia containing part of the transverse colon.  No obstruction or incarceration it was thought that it could be the source of the patient's midline abdominal pain.  Diffuse colonic diverticulosis.  Serpiginous appearing cystic lesion in the pelvis closely associated with the right ovary was suspect for hydrosalpinx.  It was recommended she have an ultrasound.  Status post hysterectomy with a mild cystocele.  Suspect interstitial lung disease with bronchiectasis at the lung bases.  At that time patient was referred to gynecology for cystic  lesion in the pelvis which may be linked to her chronic right lower quadrant pain.  Was also recommended she be referred to [facility] for consideration of repair of her abdominal wall hernia.    Today, explains that she feels 90% better.  Believes that the increase in Pantoprazole to twice daily has helped a lot and the MiraLAX has relieved a lot of her bloating and discomfort.      She has followed up with the gynecologist in regards to right lower quadrant cyst and was told that this was benign after having a pelvic ultrasound.      She has not followed with [facility] yet in regards to her ventral hernia.  Continues to experience a 2/10 right lower quadrant pain, chronic for her.    Denies fever, chills, blood in her stool or other GI complaints today.  Past Medical History:  Diagnosis Date  . AICD (automatic cardioverter/defibrillator) present   . Anxiety   . Arthritis   . 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 Cerebral aneurysm, nonruptured    had a clip put in  . Clostridium difficile infection   . Depressive disorder, not elsewhere classified   . Diverticulosis of colon (without mention of hemorrhage)   . Esophageal reflux   . Fibromyalgia   . Gastritis   . GERD (gastroesophageal reflux disease)   . GI bleed 2004  . Hiatal hernia   . Hyperlipidemia   . Hypertension   . Hypothyroidism   . Internal hemorrhoids   . Obstructive sleep apnea (adult) (pediatric)   .  Ostium secundum type atrial septal defect   . Other chronic nonalcoholic liver disease   . Other pulmonary embolism and infarction   . Paroxysmal ventricular tachycardia ([place])   . Personal history of radiation therapy   . Pneumonia    history  . Presence of permanent cardiac pacemaker   . PUD (peptic ulcer disease)   . Radiation 02/03/15-03/10/15   Right Breast  . Sarcoid    per pt , not sure  . Schatzki's ring   . Stroke [facility]) 2013   tia/ pt feels it was around 2008 0r 2009  . Takotsubo syndrome    . Tubular adenoma of colon   . Unspecified transient cerebral ischemia   . Unspecified vitamin D deficiency     Past Surgical History:  Procedure Laterality Date  . ABDOMINAL HYSTERECTOMY    . ABI  2006   normal  . BREAST EXCISIONAL BIOPSY    . BREAST LUMPECTOMY     right breast 2016  . BREAST LUMPECTOMY WITH RADIOACTIVE SEED LOCALIZATION Right 01/03/2015   Procedure: BREAST LUMPECTOMY WITH RADIOACTIVE SEED LOCALIZATION;  Surgeon: Autumn Messing III, MD;  Location: [facility];  Service: General;  Laterality: Right;  . CARDIAC DEFIBRILLATOR PLACEMENT  2006; 2012   BSX single chamber ICD  . carotid dopplers  2006   neg  . CEREBRAL ANEURYSM REPAIR  02/1999  . COLONOSCOPY    . hospitalization  2004   GI bleed, PUD, diverticulosis (EGD,colonscopy)  . hospitalization     PE, NSVT, s/p defib  . JOINT REPLACEMENT  2017   left knee   . KNEE ARTHROSCOPY     bilateral  . LEFT HEART CATHETERIZATION WITH CORONARY ANGIOGRAM N/A 02/22/2012   Procedure: LEFT HEART CATHETERIZATION WITH CORONARY ANGIOGRAM;  Surgeon: Burnell Blanks, MD;  Location: [facility] CATH LAB;  Service: Cardiovascular;  Laterality: N/A;  . PARTIAL HYSTERECTOMY     Fibroids  . TONGUE BIOPSY  12/12/2017   due to sore tongue and white patches/abnormal cells  . TOTAL KNEE ARTHROPLASTY Left 02/13/2016   Procedure: TOTAL KNEE ARTHROPLASTY;  Surgeon: Vickey Huger, MD;  Location: [facility];  Service: Orthopedics;  Laterality: Left;  . UPPER GASTROINTESTINAL ENDOSCOPY      Current Outpatient Medications  Medication Sig Dispense Refill  . acetaminophen (TYLENOL) 500 MG tablet Take 500 mg by mouth every 6 (six) hours as needed (pain).     [name] albuterol (VENTOLIN HFA) 108 (90 Base) MCG/ACT inhaler Inhale 2 puffs into the lungs every 6 (six) hours as needed for wheezing or shortness of breath. 3 Inhaler 3  . aspirin 81 MG tablet Take 81 mg by mouth daily.    . baclofen (LIORESAL) 10 MG tablet TAKE 1 TABLET THREE TIMES DAILY (Patient taking  differently: TAKE 1 TABLET THREE TIMES DAILY as needed) 90 tablet 3  . calcium carbonate (OSCAL) 1500 (600 Ca) MG TABS tablet Take 600 mg of elemental calcium by mouth daily with breakfast.    . cetirizine (ZYRTEC) 10 MG tablet Take 10 mg by mouth daily. Take one prn alternating with Allegra    . cholecalciferol (VITAMIN D) 1000 UNITS tablet Take 2,000 Units by mouth daily.     [name] escitalopram (LEXAPRO) 10 MG tablet Take 1 tablet (10 mg total) by mouth daily. 30 tablet 11  . fexofenadine (ALLEGRA) 180 MG tablet Take 180 mg by mouth daily as needed for allergies or rhinitis.    . fluticasone (FLONASE) 50 MCG/ACT nasal spray Place 2 sprays into both nostrils  daily as needed for allergies or rhinitis. 48 g 3  . hydrochlorothiazide (HYDRODIURIL) 12.5 MG tablet Take 1 tablet (12.5 mg total) by mouth daily. 90 tablet 3  . latanoprost (XALATAN) 0.005 % ophthalmic solution Place 1 drop into both eyes at bedtime.    [name] levothyroxine (SYNTHROID, LEVOTHROID) 50 MCG tablet Take 1 tablet (50 mcg total) by mouth daily. 90 tablet 3  . mometasone (ELOCON) 0.1 % lotion as needed.     . montelukast (SINGULAIR) 10 MG tablet Take 1 tablet (10 mg total) by mouth at bedtime. 90 tablet 3  . Multiple Vitamins-Minerals (MULTIVITAMIN PO) Take 1 tablet by mouth daily.    . NON FORMULARY HEMP/CBD OIL -takes a few drops or a pill prn    . pantoprazole (PROTONIX) 40 MG tablet Take 1 tablet (40 mg total) by mouth 2 (two) times daily before a meal. 60 tablet 1  . polyethylene glycol powder (GLYCOLAX/MIRALAX) powder Take 17 grams by mouth daily (Patient taking differently: Take 17 grams by mouth PRN) 1080 g 0  . potassium chloride (K-DUR) 10 MEQ tablet TAKE 1/2 TABLET (5MEQ TOTAL) EVERY DAY 45 tablet 3  . predniSONE (DELTASONE) 50 MG tablet Take 1 tablet (50 mg total) by mouth as directed. 3 tablet 0  . Probiotic Product (PROBIOTIC-10 PO) Take by mouth at bedtime.    [name] Respiratory Therapy Supplies (FLUTTER) DEVI 1 application by  Does not apply route 2 (two) times daily before lunch and supper. 1 each 0  . simvastatin (ZOCOR) 40 MG tablet TAKE 1/2 TABLET  (20  MG  TOTAL) EVERY DAY 45 tablet 3  . traMADol (ULTRAM) 50 MG tablet Take 1 tablet (50 mg total) by mouth at bedtime. (Patient taking differently: Take 50 mg by mouth as needed. ) 30 tablet 1  . zolpidem (AMBIEN) 10 MG tablet Take 1 tablet (10 mg total) by mouth at bedtime as needed. for sleep (Patient taking differently: Take 10 mg by mouth at bedtime. for sleep) 90 tablet 1  . carvedilol (COREG) 12.5 MG tablet Take 0.5 tablets (6.25 mg total) by mouth 2 (two) times daily. 90 tablet 3   No current facility-administered medications for this visit.     Allergies as of 03/06/2018 - Review Complete 03/06/2018  Allergen Reaction Noted  . Ace inhibitors Swelling 02/18/2014  . Dilantin [phenytoin sodium extended] Rash 02/19/2012  . Atorvastatin Other (See Comments) 04/18/2007  . Ramipril Other (See Comments) 12/01/2014  . Rosuvastatin Other (See Comments) 04/18/2007  . Iodine-131 Other (See Comments) 08/24/2011  . Paroxetine Other (See Comments) 04/18/2007  . Wellbutrin [bupropion]  02/24/2018  . Amitriptyline hcl Other (See Comments) 04/18/2007  . Carbamazepine Rash 04/18/2007  . Cetirizine hcl Other (See Comments) 04/18/2007  . Codeine Itching 04/18/2007  . Phenytoin sodium extended Rash 02/19/2012    Family History  Problem Relation Age of Onset  . Diabetes Mother   . Alzheimer's disease Mother   . Other Brother        Thyroid problem 10/2016  . Allergies Sister   . Heart disease Sister   . Heart disease Brother   . Prostate cancer Brother 27       same brother as throat cancer  . Throat cancer Brother        dx in his 47s; also a smoker  . Heart disease Father   . Cancer Maternal Grandmother 35       colon cancer or abdominal cancer  . Lung cancer Maternal Grandfather 67  .  Breast cancer Maternal Aunt 75  . Colon cancer Maternal Aunt 58        same sister as breast at 43  . Lung cancer Maternal Uncle   . Breast cancer Maternal Aunt        dx in her 55s  . Pancreatic cancer Cousin 8       maternal first cousin  . Breast cancer Cousin        paternal first cousin twice removed died in her 57s  . Cardiomyopathy Unknown        Family history  . Heart disease Son        Cardiac Arrest 07/2016  . Stroke Neg Hx     Social History   Socioeconomic History  . Marital status: Divorced    Spouse name: Not on file  . Number of children: 2  . Years of education: 24  . Highest education level: Not on file  Occupational History  . Occupation: DISABLED  Social Needs  . Financial resource strain: Not on file  . Food insecurity:    Worry: Not on file    Inability: Not on file  . Transportation needs:    Medical: Not on file    Non-medical: Not on file  Tobacco Use  . Smoking status: Former Smoker    Packs/day: 1.00    Years: 15.00    Pack years: 15.00    Types: Cigarettes    Last attempt to quit: 08/20/1989    Years since quitting: 28.5  . Smokeless tobacco: Never Used  . Tobacco comment: Started at 45; less than 1 PPD  Substance and Sexual Activity  . Alcohol use: Yes    Alcohol/week: 0.0 oz    Comment: rare  . Drug use: No  . Sexual activity: Not Currently  Lifestyle  . Physical activity:    Days per week: Not on file    Minutes per session: Not on file  . Stress: Not on file  Relationships  . Social connections:    Talks on phone: Not on file    Gets together: Not on file    Attends religious service: Not on file    Active member of club or organization: Not on file    Attends meetings of clubs or organizations: Not on file    Relationship status: Not on file  . Intimate partner violence:    Fear of current or ex partner: Not on file    Emotionally abused: Not on file    Physically abused: Not on file    Forced sexual activity: Not on file  Other Topics Concern  . Not on file  Social History Narrative    Lives alone   Caffeine XBL:[id number]   Retired from [employer]   2 children   Divorced   Right handed     Review of Systems:    Constitutional: No weight loss, fever or chills Cardiovascular: No chest pain Respiratory: No SOB Gastrointestinal: See HPI and otherwise negative   Physical Exam:  Vital signs: Ht 5' 6.5" (1.689 m)   Wt 219 lb 8 oz (99.6 kg)   LMP  (LMP Unknown)   BMI 34.90 kg/m   Constitutional:   Very Pleasant AA female appears to be in NAD, Well developed, Well nourished, alert and cooperative Respiratory: Respirations even and unlabored. Lungs clear to auscultation bilaterally.   No wheezes, crackles, or rhonchi.  Cardiovascular: Normal S1, S2. No MRG. Regular rate and rhythm. No peripheral edema, cyanosis or  pallor.  Gastrointestinal:  Soft, nondistended, mild RLQ ttp No rebound or guarding. Normal bowel sounds. No appreciable masses or hepatomegaly. Psychiatric: Demonstrates good judgement and reason without abnormal affect or behaviors.  See HPI for recent labs/imaging.  Assessment: 1.  Right lower quadrant pain: Chronic for the patient, recent work-up with pelvic ultrasound- uncertain if this was source of pain or could be contributing as gynecologist did not remark, also ventral hernia on CT, question relation to this 2.  Constipation: Better after addition of MiraLAX 3.  History of intestinal metaplasia: On EGD, repeat recommended in May 2020  Plan: 1.  Patient feels much relieved on increased Pantoprazole 40 mg twice daily.  Continued this today for the patient.  Sent a 90-day prescription to [facility] for her.  We did discuss possibly switching back to Pantoprazole 40 mg once daily and starting a Ranitidine at night, but patient wishes to stay on the Pantoprazole.  She can have this discussion again with Dr. Hilarie Fredrickson follow-up. 2.  Patient to continue MiraLAX once daily. 3.  I did recommend patient follow with [facility] in regards to her ventral hernia which may be the  source of her chronic right lower quadrant pain. She will call them back to schedule appt. She missed her first one. 4.  Discussed visit with gynecologist, apparently they did not remark as to whether or not fluid in this area would cause any discomfort. 5.  Patient to follow in clinic with Dr. Hilarie Fredrickson in 3-4 months or sooner if necessary.  Ellouise Newer, PA-C [facility] 03/06/2018, 2:42 PM  Cc: Tower, [name], MD   Addendum: Reviewed and agree with management. Pyrtle, Lajuan Lines, MD

## 2018-03-06 NOTE — Patient Instructions (Signed)
Continue Miralax   We have sent the following medications to your pharmacy for you to pick up at your convenience:   Pantoprazole 40 mg twice a day   Please call [facility] and reschedule your appointment. [phone]

## 2018-03-13 LAB — CUP PACEART REMOTE DEVICE CHECK
Battery Remaining Longevity: 66 mo
Brady Statistic RV Percent Paced: 0 %
Date Time Interrogation Session: 20190717070000
HighPow Impedance: 67 Ohm
Lead Channel Pacing Threshold Amplitude: 0.7 V
Lead Channel Setting Pacing Amplitude: 2.4 V
Lead Channel Setting Pacing Pulse Width: 0.4 ms
MDC IDC LEAD IMPLANT DT: 20050603
MDC IDC LEAD LOCATION: 753860
MDC IDC LEAD SERIAL: 116340
MDC IDC MSMT BATTERY REMAINING PERCENTAGE: 66 %
MDC IDC MSMT LEADCHNL RV IMPEDANCE VALUE: 644 Ohm
MDC IDC MSMT LEADCHNL RV PACING THRESHOLD PULSEWIDTH: 0.4 ms
MDC IDC PG IMPLANT DT: 20110505
MDC IDC PG SERIAL: 266301
MDC IDC SET LEADCHNL RV SENSING SENSITIVITY: 0.4 mV

## 2018-03-17 ENCOUNTER — Other Ambulatory Visit: Payer: Self-pay | Admitting: Physician Assistant

## 2018-03-17 NOTE — Telephone Encounter (Signed)
Last visit: 01/16/18 Next Visit due in November. Message sent to the front to schedule patient.   Okay to refill per Dr. Estanislado Pandy

## 2018-03-26 ENCOUNTER — Ambulatory Visit (INDEPENDENT_AMBULATORY_CARE_PROVIDER_SITE_OTHER): Payer: Medicare Other | Admitting: Psychology

## 2018-03-26 DIAGNOSIS — F3289 Other specified depressive episodes: Secondary | ICD-10-CM

## 2018-04-01 ENCOUNTER — Ambulatory Visit (INDEPENDENT_AMBULATORY_CARE_PROVIDER_SITE_OTHER): Payer: Medicare Other | Admitting: Internal Medicine

## 2018-04-01 ENCOUNTER — Telehealth: Payer: Self-pay

## 2018-04-01 VITALS — BP 120/74 | HR 65 | Temp 98.0°F | Wt 221.0 lb

## 2018-04-01 DIAGNOSIS — J479 Bronchiectasis, uncomplicated: Secondary | ICD-10-CM

## 2018-04-01 DIAGNOSIS — R059 Cough, unspecified: Secondary | ICD-10-CM

## 2018-04-01 DIAGNOSIS — R05 Cough: Secondary | ICD-10-CM | POA: Diagnosis not present

## 2018-04-01 NOTE — Progress Notes (Signed)
Subjective:    Patient ID: [name], female    DOB: 1950-11-12, 67 y.o.   MRN: [id number]  HPI  Pt present sto the clinic today with c/o persistent cough. She reports this has been going on since her 01/2018 office visit with Dr. Glori Bickers. She reports the cough is productive of yellow mucus at times.  Denies wheezing or shortness of breath.  She has been seeing pulmonary for diagnosis of bronchiectasis, but she reports they really have not done anything for her.  She has tried Mucinex and Singulair with no relief.  She has not tried anything OTC.  She denies sick contacts.  However I  Review of Systems      Past Medical History:  Diagnosis Date  . AICD (automatic cardioverter/defibrillator) present   . Anxiety   . Arthritis   . 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 Cerebral aneurysm, nonruptured    had a clip put in  . Clostridium difficile infection   . Depressive disorder, not elsewhere classified   . Diverticulosis of colon (without mention of hemorrhage)   . Esophageal reflux   . Fibromyalgia   . Gastritis   . GERD (gastroesophageal reflux disease)   . GI bleed 2004  . Hiatal hernia   . Hyperlipidemia   . Hypertension   . Hypothyroidism   . Internal hemorrhoids   . Obstructive sleep apnea (adult) (pediatric)   . Ostium secundum type atrial septal defect   . Other chronic nonalcoholic liver disease   . Other pulmonary embolism and infarction   . Paroxysmal ventricular tachycardia ([place])   . Personal history of radiation therapy   . Pneumonia    history  . Presence of permanent cardiac pacemaker   . PUD (peptic ulcer disease)   . Radiation 02/03/15-03/10/15   Right Breast  . Sarcoid    per pt , not sure  . Schatzki's ring   . Stroke [facility]) 2013   tia/ pt feels it was around 2008 0r 2009  . Takotsubo syndrome   . Tubular adenoma of colon   . Unspecified transient cerebral ischemia   . Unspecified vitamin D deficiency     Current  Outpatient Medications  Medication Sig Dispense Refill  . acetaminophen (TYLENOL) 500 MG tablet Take 500 mg by mouth every 6 (six) hours as needed (pain).     [name] albuterol (VENTOLIN HFA) 108 (90 Base) MCG/ACT inhaler Inhale 2 puffs into the lungs every 6 (six) hours as needed for wheezing or shortness of breath. 3 Inhaler 3  . aspirin 81 MG tablet Take 81 mg by mouth daily.    . baclofen (LIORESAL) 10 MG tablet TAKE 1 TABLET THREE TIMES DAILY 270 tablet 0  . calcium carbonate (OSCAL) 1500 (600 Ca) MG TABS tablet Take 600 mg of elemental calcium by mouth daily with breakfast.    . cetirizine (ZYRTEC) 10 MG tablet Take 10 mg by mouth daily. Take one prn alternating with Allegra    . cholecalciferol (VITAMIN D) 1000 UNITS tablet Take 2,000 Units by mouth daily.     [name] escitalopram (LEXAPRO) 10 MG tablet Take 1 tablet (10 mg total) by mouth daily. 30 tablet 11  . fexofenadine (ALLEGRA) 180 MG tablet Take 180 mg by mouth daily as needed for allergies or rhinitis.    . fluticasone (FLONASE) 50 MCG/ACT nasal spray Place 2 sprays into both nostrils daily as needed for allergies or rhinitis. 48 g 3  .  hydrochlorothiazide (HYDRODIURIL) 12.5 MG tablet Take 1 tablet (12.5 mg total) by mouth daily. 90 tablet 3  . latanoprost (XALATAN) 0.005 % ophthalmic solution Place 1 drop into both eyes at bedtime.    [name] levothyroxine (SYNTHROID, LEVOTHROID) 50 MCG tablet Take 1 tablet (50 mcg total) by mouth daily. 90 tablet 3  . mometasone (ELOCON) 0.1 % lotion as needed.     . montelukast (SINGULAIR) 10 MG tablet Take 1 tablet (10 mg total) by mouth at bedtime. 90 tablet 3  . Multiple Vitamins-Minerals (MULTIVITAMIN PO) Take 1 tablet by mouth daily.    . NON FORMULARY HEMP/CBD OIL -takes a few drops or a pill prn    . pantoprazole (PROTONIX) 40 MG tablet Take 1 tablet (40 mg total) by mouth 2 (two) times daily before a meal. 180 tablet 2  . polyethylene glycol powder (GLYCOLAX/MIRALAX) powder Take 17 grams by mouth daily  (Patient taking differently: Take 17 grams by mouth PRN) 1080 g 0  . potassium chloride (K-DUR) 10 MEQ tablet TAKE 1/2 TABLET (5MEQ TOTAL) EVERY DAY 45 tablet 3  . Probiotic Product (PROBIOTIC-10 PO) Take by mouth at bedtime.    [name] Respiratory Therapy Supplies (FLUTTER) DEVI 1 application by Does not apply route 2 (two) times daily before lunch and supper. 1 each 0  . simvastatin (ZOCOR) 40 MG tablet TAKE 1/2 TABLET  (20  MG  TOTAL) EVERY DAY 45 tablet 3  . traMADol (ULTRAM) 50 MG tablet Take 1 tablet (50 mg total) by mouth at bedtime. (Patient taking differently: Take 50 mg by mouth as needed. ) 30 tablet 1  . zolpidem (AMBIEN) 10 MG tablet Take 1 tablet (10 mg total) by mouth at bedtime as needed. for sleep (Patient taking differently: Take 10 mg by mouth at bedtime. for sleep) 90 tablet 1  . carvedilol (COREG) 12.5 MG tablet Take 0.5 tablets (6.25 mg total) by mouth 2 (two) times daily. 90 tablet 3   No current facility-administered medications for this visit.     Allergies  Allergen Reactions  . Ace Inhibitors Swelling    Angioedema; makes tongue "break out" Angioedema; makes tongue "break out"  . Dilantin [Phenytoin Sodium Extended] Rash    Severe rash  . Atorvastatin Other (See Comments)    SEVERE MYALGIAS, MUSCLE PAIN SEVERE MYALGIAS, MUSCLE PAIN  . Ramipril Other (See Comments)    TONGUE ULCERS TONGUE ULCERS  . Rosuvastatin Other (See Comments)    MYALGIAS, SEVERE MUSCLE PAIN MYALGIAS, SEVERE MUSCLE PAIN  . Iodine-131 Other (See Comments)    Swelling at IV site only, no swelling or SOB around mouth.!  . Paroxetine Other (See Comments)    UNKNOWN UNKNOWN  . Wellbutrin [Bupropion]     Palpitations   . Amitriptyline Hcl Other (See Comments)     makes her too sleepy!  . Carbamazepine Rash    REACTION: ? reaction REACTION: ? reaction  . Cetirizine Hcl Other (See Comments)    REACTION: sleepy  . Codeine Itching  . Phenytoin Sodium Extended Rash    Family History    Problem Relation Age of Onset  . Diabetes Mother   . Alzheimer's disease Mother   . Other Brother        Thyroid problem 10/2016  . Allergies Sister   . Heart disease Sister   . Heart disease Brother   . Prostate cancer Brother 89       same brother as throat cancer  . Throat cancer Brother  dx in his 32s; also a smoker  . Heart disease Father   . Cancer Maternal Grandmother 18       colon cancer or abdominal cancer  . Lung cancer Maternal Grandfather 50  . Breast cancer Maternal Aunt 57  . Colon cancer Maternal Aunt 16       same sister as breast at 66  . Lung cancer Maternal Uncle   . Breast cancer Maternal Aunt        dx in her 41s  . Pancreatic cancer Cousin 25       maternal first cousin  . Breast cancer Cousin        paternal first cousin twice removed died in her 69s  . Cardiomyopathy Unknown        Family history  . Heart disease Son        Cardiac Arrest 07/2016  . Stroke Neg Hx     Social History   Socioeconomic History  . Marital status: Divorced    Spouse name: Not on file  . Number of children: 2  . Years of education: 28  . Highest education level: Not on file  Occupational History  . Occupation: DISABLED  Social Needs  . Financial resource strain: Not on file  . Food insecurity:    Worry: Not on file    Inability: Not on file  . Transportation needs:    Medical: Not on file    Non-medical: Not on file  Tobacco Use  . Smoking status: Former Smoker    Packs/day: 1.00    Years: 15.00    Pack years: 15.00    Types: Cigarettes    Last attempt to quit: 08/20/1989    Years since quitting: 28.6  . Smokeless tobacco: Never Used  . Tobacco comment: Started at 71; less than 1 PPD  Substance and Sexual Activity  . Alcohol use: Yes    Alcohol/week: 0.0 standard drinks    Comment: rare  . Drug use: No  . Sexual activity: Not Currently  Lifestyle  . Physical activity:    Days per week: Not on file    Minutes per session: Not on file  .  Stress: Not on file  Relationships  . Social connections:    Talks on phone: Not on file    Gets together: Not on file    Attends religious service: Not on file    Active member of club or organization: Not on file    Attends meetings of clubs or organizations: Not on file    Relationship status: Not on file  . Intimate partner violence:    Fear of current or ex partner: Not on file    Emotionally abused: Not on file    Physically abused: Not on file    Forced sexual activity: Not on file  Other Topics Concern  . Not on file  Social History Narrative   Lives alone   Caffeine UXL:KGMW   Retired from [employer]   2 children   Divorced   Right handed      Constitutional: Denies fever, malaise, fatigue, headache or abrupt weight changes.  HEENT: Denies eye pain, eye redness, ear pain, ringing in the ears, wax buildup, runny nose, nasal congestion, bloody nose, or sore throat. Respiratory: Pt reports cough. Denies difficulty breathing, shortness of breath.   Cardiovascular: Denies chest pain, chest tightness, palpitations or swelling in the hands or feet.   No other specific complaints in a complete review of systems (  except as listed in HPI above).  Objective:   Physical Exam  BP 120/74   Pulse 65   Temp 98 F (36.7 C) (Oral)   Wt 221 lb (100.2 kg)   LMP  (LMP Unknown)   SpO2 96%   BMI 35.14 kg/m  Wt Readings from Last 3 Encounters:  04/01/18 221 lb (100.2 kg)  03/06/18 219 lb 8 oz (99.6 kg)  02/05/18 214 lb 9.6 oz (97.3 kg)    General: Appears her stated age, well developed, well nourished in NAD. HEENT: Head: normal shape and size, no sinus tenderness;  Throat/Mouth: Teeth present, mucosa pink and moist,+ PND, no exudate, lesions or ulcerations noted.  Neck:  No adenopathy noted. Pulmonary/Chest: Normal effort and positive vesicular breath sounds. No respiratory distress. No wheezes, rales or ronchi noted.   BMET    Component Value Date/Time   NA 140  01/30/2018 1550   NA 141 12/01/2014 0847   K 3.5 01/30/2018 1550   K 3.8 12/01/2014 0847   CL 103 01/30/2018 1550   CO2 29 01/30/2018 1550   CO2 26 12/01/2014 0847   GLUCOSE 90 01/30/2018 1550   GLUCOSE 175 (H) 12/01/2014 0847   BUN 9 01/30/2018 1550   BUN 10.6 12/01/2014 0847   CREATININE 0.80 01/30/2018 1550   CREATININE 0.82 02/27/2017 1518   CREATININE 0.8 12/01/2014 0847   CALCIUM 10.1 01/30/2018 1550   CALCIUM 9.8 12/01/2014 0847   GFRNONAA 74 02/27/2017 1518   GFRAA 85 02/27/2017 1518    Lipid Panel     Component Value Date/Time   CHOL 159 11/27/2017 1043   TRIG 123.0 11/27/2017 1043   HDL 50.10 11/27/2017 1043   CHOLHDL 3 11/27/2017 1043   VLDL 24.6 11/27/2017 1043   LDLCALC 84 11/27/2017 1043    CBC    Component Value Date/Time   WBC 4.1 11/27/2017 1043   RBC 4.42 11/27/2017 1043   HGB 12.9 11/27/2017 1043   HGB 12.4 12/01/2014 0845   HCT 39.2 11/27/2017 1043   HCT 38.8 12/01/2014 0845   PLT 205.0 11/27/2017 1043   PLT 213 12/01/2014 0845   MCV 88.8 11/27/2017 1043   MCV 88.5 12/01/2014 0845   MCH 29.5 02/27/2017 1518   MCHC 33.0 11/27/2017 1043   RDW 12.8 11/27/2017 1043   RDW 12.7 12/01/2014 0845   LYMPHSABS 1.2 11/27/2017 1043   LYMPHSABS 1.0 12/01/2014 0845   MONOABS 0.5 11/27/2017 1043   MONOABS 0.3 12/01/2014 0845   EOSABS 0.2 11/27/2017 1043   EOSABS 0.3 12/01/2014 0845   EOSABS 0.1 03/13/2007 1249   BASOSABS 0.0 11/27/2017 1043   BASOSABS 0.0 12/01/2014 0845    Hgb A1C Lab Results  Component Value Date   HGBA1C 5.6 11/27/2017            Assessment & Plan:   Cough, secondary to Bronchiectasis:  Exam benign Advised her to continue Singulair and Mucinex Advised her no indication for abx or chest xray at this time Offered RX for Tessalon or cough syrup, she declines at this time  She should follow up with pulmonology if symptoms persist or worsens Webb Silversmith, NP

## 2018-04-01 NOTE — Telephone Encounter (Signed)
   [facility] Pre-operative Risk Assessment    Request for surgical clearance:  1. What type of surgery is being performed? Hernia Repair   2. When is this surgery scheduled? Pending Clearance   3. What type of clearance is required (medical clearance vs. Pharmacy clearance to hold med vs. Both)? Both  4. Are there any medications that need to be held prior to surgery and how long? ASA 81 mg prior to procedure.   5. Practice name and name of physician performing surgery? [facility] - Dr. Autumn Messing   6. What is your office phone number [phone]    7.   What is your office fax number [phone]  8.   Anesthesia type (None, local, MAC, general) ? General   Jacinta Shoe 04/01/2018, 12:33 PM  _________________________________________________________________   (provider comments below)

## 2018-04-02 ENCOUNTER — Encounter: Payer: Self-pay | Admitting: Internal Medicine

## 2018-04-02 NOTE — Telephone Encounter (Signed)
   Primary Cardiologist:  Dr Caryl Comes  Chart reviewed as part of pre-operative protocol coverage. Given past medical history and time since last visit, based on ACC/AHA guidelines, [name] would be at acceptable risk for the planned procedure without further cardiovascular testing.   In general we don't recommend holding aspirin if possible but will defer to the surgeon.  I will route this recommendation to the requesting party via Epic fax function and remove from pre-op pool.  Please call with questions.  Kerin Ransom, PA-C 04/02/2018, 2:27 PM

## 2018-04-02 NOTE — Patient Instructions (Signed)

## 2018-04-02 NOTE — Telephone Encounter (Signed)
F/u   Pt returning call to pre-op team

## 2018-04-04 ENCOUNTER — Ambulatory Visit: Payer: Self-pay

## 2018-04-04 ENCOUNTER — Telehealth: Payer: Medicare Other | Admitting: Nurse Practitioner

## 2018-04-04 DIAGNOSIS — N898 Other specified noninflammatory disorders of vagina: Secondary | ICD-10-CM

## 2018-04-04 MED ORDER — METRONIDAZOLE 500 MG PO TABS: 500.0000 mg | ORAL_TABLET | Freq: Two times a day (BID) | ORAL | 0 refills | Status: DC

## 2018-04-04 NOTE — Progress Notes (Signed)
We are sorry that you are not feeling well. Here is how we plan to help! Based on what you shared with me it looks like you: May have a vaginosis due to bacteria  Vaginosis is an inflammation of the vagina that can result in discharge, itching and pain. The cause is usually a change in the normal balance of vaginal bacteria or an infection. Vaginosis can also result from reduced estrogen levels after menopause.  The most common causes of vaginosis are:   Bacterial vaginosis which results from an overgrowth of one on several organisms that are normally present in your vagina.   Yeast infections which are caused by a naturally occurring fungus called candida.   Vaginal atrophy (atrophic vaginosis) which results from the thinning of the vagina from reduced estrogen levels after menopause.   Trichomoniasis which is caused by a parasite and is commonly transmitted by sexual intercourse.  Factors that increase your risk of developing vaginosis include: . Medications, such as antibiotics and steroids . Uncontrolled diabetes . Use of hygiene products such as bubble bath, vaginal spray or vaginal deodorant . Douching . Wearing damp or tight-fitting clothing . Using an intrauterine device (IUD) for birth control . Hormonal changes, such as those associated with pregnancy, birth control pills or menopause . Sexual activity . Having a sexually transmitted infection  Your treatment plan is Metronidazole or Flagyl 500mg twice a day for 7 days.  I have electronically sent this prescription into the pharmacy that you have chosen.  Be sure to take all of the medication as directed. Stop taking any medication if you develop a rash, tongue swelling or shortness of breath. Mothers who are breast feeding should consider pumping and discarding their breast milk while on these antibiotics. However, there is no consensus that infant exposure at these doses would be harmful.  Remember that medication creams can  weaken latex condoms. .   HOME CARE:  Good hygiene may prevent some types of vaginosis from recurring and may relieve some symptoms:  . Avoid baths, hot tubs and whirlpool spas. Rinse soap from your outer genital area after a shower, and dry the area well to prevent irritation. Don't use scented or harsh soaps, such as those with deodorant or antibacterial action. . Avoid irritants. These include scented tampons and pads. . Wipe from front to back after using the toilet. Doing so avoids spreading fecal bacteria to your vagina.  Other things that may help prevent vaginosis include:  . Don't douche. Your vagina doesn't require cleansing other than normal bathing. Repetitive douching disrupts the normal organisms that reside in the vagina and can actually increase your risk of vaginal infection. Douching won't clear up a vaginal infection. . Use a latex condom. Both female and female latex condoms may help you avoid infections spread by sexual contact. . Wear cotton underwear. Also wear pantyhose with a cotton crotch. If you feel comfortable without it, skip wearing underwear to bed. Yeast thrives in moist environments Your symptoms should improve in the next day or two.  GET HELP RIGHT AWAY IF:  . You have pain in your lower abdomen ( pelvic area or over your ovaries) . You develop nausea or vomiting . You develop a fever . Your discharge changes or worsens . You have persistent pain with intercourse . You develop shortness of breath, a rapid pulse, or you faint.  These symptoms could be signs of problems or infections that need to be evaluated by a medical provider now.    MAKE SURE YOU    Understand these instructions.  Will watch your condition.  Will get help right away if you are not doing well or get worse.  Your e-visit answers were reviewed by a board certified advanced clinical practitioner to complete your personal care plan. Depending upon the condition, your plan could  have included both over the counter or prescription medications. Please review your pharmacy choice to make sure that you have choses a pharmacy that is open for you to pick up any needed prescription, Your safety is important to us. If you have drug allergies check your prescription carefully.   You can use MyChart to ask questions about today's visit, request a non-urgent call back, or ask for a work or school excuse for 24 hours related to this e-Visit. If it has been greater than 24 hours you will need to follow up with your provider, or enter a new e-Visit to address those concerns. You will get a MyChart message within the next two days asking about your experience. I hope that your e-visit has been valuable and will speed your recovery.  

## 2018-04-04 NOTE — Telephone Encounter (Signed)
Incoming call from patient with complaint of vaginal discharge with odor.  States it is the "same as before".  Discharge is yellow/greenish  in color and positive for odor.  There is a rash present due to scratching and and irritation.  Denies pain. Patient has an appoint for Monday 3:45pm.  Was wondering if something could be called in for over the weekend.  Patient uses [facility] on [address]. Provided care Advice.  Patient voiced understanding. Recommended vagisil cream externally only for itching.  Recommended no douching.Voiced understanding. May reach patient at [phone]  Reason for Disposition . Abnormal color vaginal discharge (i.e., yellow, green, gray)  Answer Assessment - Initial Assessment Questions 1. DISCHARGE: "Describe the discharge." (e.g., white, yellow, green, gray, foamy, cottage cheese-like)     *No Answer*yellow greenis 2. ODOR: "Is there a bad odor?"     Yellow greenish yes 3. ONSET: "When did the discharge begin?"     3 to 4 day 4. RASH: "Is there a rash in that area?" If so, ask: "Describe it." (e.g., redness, blisters, sores, bumps)     Rash sore due to itching scrating 5. ABDOMINAL PAIN: "Are you having any abdominal pain?" If yes: "What does it feel like? " (e.g., crampy, dull, intermittent, constant)      no 6. ABDOMINAL PAIN SEVERITY: If present, ask: "How bad is it?"  (e.g., mild, moderate, severe)  - MILD - doesn't interfere with normal activities   - MODERATE - interferes with normal activities or awakens from sleep   - SEVERE - patient doesn't want to move (R/O peritonitis)      no 7. CAUSE: "What do you think is causing the discharge?" "Have you had the same problem before? What happened then?"     Same as before 8. OTHER SYMPTOMS: "Do you have any other symptoms?" (e.g., fever, itching, vaginal bleeding, pain with urination, injury to genital area, vaginal foreign body)     no 9. PREGNANCY: "Is there any chance you are pregnant?"  "When was your last menstrual period?"     no  Protocols used: VAGINAL DISCHARGE-A-AH

## 2018-04-04 NOTE — Telephone Encounter (Signed)
It looks like she went ahead and had an e visit today and they sent in tx for bacterial vaginosis  Hope this helps

## 2018-04-07 ENCOUNTER — Ambulatory Visit: Payer: Self-pay | Admitting: Family Medicine

## 2018-04-10 ENCOUNTER — Telehealth: Payer: Self-pay | Admitting: Pulmonary Disease

## 2018-04-10 NOTE — Telephone Encounter (Signed)
Received report from Dr. Jamison Oka with [facility].  Planning to do repair of ventral abdominal hernia.  Patient seen on 02/05/18 and was low risk for surgery.    Please send copy of my note from 02/05/18 to Dr. Ethlyn Gallery office.

## 2018-04-14 ENCOUNTER — Other Ambulatory Visit: Payer: Self-pay | Admitting: Cardiology

## 2018-04-15 NOTE — Telephone Encounter (Signed)
Faxed copies of VS ov notes and message to Dr. Ethlyn Gallery office this morning Faxed confirmation went through Nothing further needed.

## 2018-04-25 ENCOUNTER — Ambulatory Visit: Payer: Self-pay | Admitting: General Surgery

## 2018-05-05 ENCOUNTER — Ambulatory Visit (INDEPENDENT_AMBULATORY_CARE_PROVIDER_SITE_OTHER): Payer: Medicare Other | Admitting: Neurology

## 2018-05-05 ENCOUNTER — Encounter: Payer: Self-pay | Admitting: Neurology

## 2018-05-05 VITALS — BP 110/60 | HR 69 | Ht 66.5 in | Wt 223.5 lb

## 2018-05-05 DIAGNOSIS — M797 Fibromyalgia: Secondary | ICD-10-CM | POA: Diagnosis not present

## 2018-05-05 NOTE — Progress Notes (Signed)
Reason for visit: Fibromyalgia  [name] is an 67 y.o. female  History of present illness:  [name] is a 67 year old right-handed black female with a history of fibromyalgia.  The patient has chronic neck pain and some paresthesias into the left hand.  More recently, the left hand paresthesias have gone away but the neck pain continues.  CT scan of the cervical spine suggested spondylosis that could lead to left C5 nerve root impingement.  EMG and nerve conduction study done previously did not show evidence of a radiculopathy.  The patient has not wished to undergo epidural steroid injections in the neck.  She was given a prescription for gabapentin but she never started the medication.  She comes to the office today complaining of some right shoulder pain.  She was working in the yard before onset of the pain, the shoulder pain began about 2 weeks ago.  She has pain when she lifts the arm up, she feels better when she rests the arm.  The patient takes baclofen but she only takes 10 mg at nighttime.  She more recently in the last day or 2 she has developed some groin pain on the left.  She returns to this office for an evaluation.  Past Medical History:  Diagnosis Date  . AICD (automatic cardioverter/defibrillator) present   . Anxiety   . Arthritis   . 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 Cerebral aneurysm, nonruptured    had a clip put in  . Clostridium difficile infection   . Depressive disorder, not elsewhere classified   . Diverticulosis of colon (without mention of hemorrhage)   . Esophageal reflux   . Fibromyalgia   . Gastritis   . GERD (gastroesophageal reflux disease)   . GI bleed 2004  . Hiatal hernia   . Hyperlipidemia   . Hypertension   . Hypothyroidism   . Internal hemorrhoids   . Obstructive sleep apnea (adult) (pediatric)   . Ostium secundum type atrial septal defect   . Other chronic nonalcoholic liver disease   . Other  pulmonary embolism and infarction   . Paroxysmal ventricular tachycardia ([place])   . Personal history of radiation therapy   . Pneumonia    history  . Presence of permanent cardiac pacemaker   . PUD (peptic ulcer disease)   . Radiation 02/03/15-03/10/15   Right Breast  . Sarcoid    per pt , not sure  . Schatzki's ring   . Stroke [facility]) 2013   tia/ pt feels it was around 2008 0r 2009  . Takotsubo syndrome   . Tubular adenoma of colon   . Unspecified transient cerebral ischemia   . Unspecified vitamin D deficiency     Past Surgical History:  Procedure Laterality Date  . ABDOMINAL HYSTERECTOMY    . ABI  2006   normal  . BREAST EXCISIONAL BIOPSY    . BREAST LUMPECTOMY     right breast 2016  . BREAST LUMPECTOMY WITH RADIOACTIVE SEED LOCALIZATION Right 01/03/2015   Procedure: BREAST LUMPECTOMY WITH RADIOACTIVE SEED LOCALIZATION;  Surgeon: Autumn Messing III, MD;  Location: [place];  Service: General;  Laterality: Right;  . CARDIAC DEFIBRILLATOR PLACEMENT  2006; 2012   BSX single chamber ICD  . carotid dopplers  2006   neg  . CEREBRAL ANEURYSM REPAIR  02/1999  . COLONOSCOPY    . hospitalization  2004   GI bleed, PUD, diverticulosis (EGD,colonscopy)  . hospitalization  PE, NSVT, s/p defib  . JOINT REPLACEMENT  2017   left knee   . KNEE ARTHROSCOPY     bilateral  . LEFT HEART CATHETERIZATION WITH CORONARY ANGIOGRAM N/A 02/22/2012   Procedure: LEFT HEART CATHETERIZATION WITH CORONARY ANGIOGRAM;  Surgeon: Burnell Blanks, MD;  Location: [facility] CATH LAB;  Service: Cardiovascular;  Laterality: N/A;  . PARTIAL HYSTERECTOMY     Fibroids  . TONGUE BIOPSY  12/12/2017   due to sore tongue and white patches/abnormal cells  . TOTAL KNEE ARTHROPLASTY Left 02/13/2016   Procedure: TOTAL KNEE ARTHROPLASTY;  Surgeon: Vickey Huger, MD;  Location: [place];  Service: Orthopedics;  Laterality: Left;  . UPPER GASTROINTESTINAL ENDOSCOPY      Family History  Problem Relation Age of Onset  . Diabetes  Mother   . Alzheimer's disease Mother   . Other Brother        Thyroid problem 10/2016  . Allergies Sister   . Heart disease Sister   . Heart disease Brother   . Prostate cancer Brother 65       same brother as throat cancer  . Throat cancer Brother        dx in his 94s; also a smoker  . Heart disease Father   . Cancer Maternal Grandmother 67       colon cancer or abdominal cancer  . Lung cancer Maternal Grandfather 56  . Breast cancer Maternal Aunt 29  . Colon cancer Maternal Aunt 49       same sister as breast at 78  . Lung cancer Maternal Uncle   . Breast cancer Maternal Aunt        dx in her 27s  . Pancreatic cancer Cousin 66       maternal first cousin  . Breast cancer Cousin        paternal first cousin twice removed died in her 73s  . Cardiomyopathy Unknown        Family history  . Heart disease Son        Cardiac Arrest 07/2016  . Stroke Neg Hx     Social history:  reports that she quit smoking about 28 years ago. Her smoking use included cigarettes. She has a 15.00 pack-year smoking history. She has never used smokeless tobacco. She reports that she drinks alcohol. She reports that she does not use drugs.    Allergies  Allergen Reactions  . Ace Inhibitors Swelling    Angioedema; makes tongue "break out" Angioedema; makes tongue "break out"  . Dilantin [Phenytoin Sodium Extended] Rash    Severe rash  . Atorvastatin Other (See Comments)    SEVERE MYALGIAS, MUSCLE PAIN SEVERE MYALGIAS, MUSCLE PAIN  . Ramipril Other (See Comments)    TONGUE ULCERS TONGUE ULCERS  . Rosuvastatin Other (See Comments)    MYALGIAS, SEVERE MUSCLE PAIN MYALGIAS, SEVERE MUSCLE PAIN  . Iodine-131 Other (See Comments)    Swelling at IV site only, no swelling or SOB around mouth.!  . Paroxetine Other (See Comments)    UNKNOWN UNKNOWN  . Wellbutrin [Bupropion]     Palpitations   . Amitriptyline Hcl Other (See Comments)     makes her too sleepy!  . Carbamazepine Rash    REACTION: ?  reaction REACTION: ? reaction  . Cetirizine Hcl Other (See Comments)    REACTION: sleepy  . Codeine Itching  . Phenytoin Sodium Extended Rash    Medications:  Prior to Admission medications   Medication Sig Start Date End Date  Taking? Authorizing Provider  acetaminophen (TYLENOL) 500 MG tablet Take 500 mg by mouth every 6 (six) hours as needed (pain).    Yes [provider]  albuterol (VENTOLIN HFA) 108 (90 Base) MCG/ACT inhaler Inhale 2 puffs into the lungs every 6 (six) hours as needed for wheezing or shortness of breath. 02/05/18  Yes Chesley Mires, MD  aspirin 81 MG tablet Take 81 mg by mouth daily.   Yes [provider]  baclofen (LIORESAL) 10 MG tablet TAKE 1 TABLET THREE TIMES DAILY 03/17/18  Yes Deveshwar, Abel Presto, MD  calcium carbonate (OSCAL) 1500 (600 Ca) MG TABS tablet Take 600 mg of elemental calcium by mouth daily with breakfast.   Yes [provider]  cetirizine (ZYRTEC) 10 MG tablet Take 10 mg by mouth daily. Take one prn alternating with Allegra   Yes [provider]  cholecalciferol (VITAMIN D) 1000 UNITS tablet Take 2,000 Units by mouth daily.    Yes [provider]  escitalopram (LEXAPRO) 10 MG tablet Take 1 tablet (10 mg total) by mouth daily. 02/24/18  Yes Tower, [name], MD  fexofenadine (ALLEGRA) 180 MG tablet Take 180 mg by mouth daily as needed for allergies or rhinitis.   Yes [provider]  fluticasone (FLONASE) 50 MCG/ACT nasal spray Place 2 sprays into both nostrils daily as needed for allergies or rhinitis. 12/03/17  Yes Tower, [name], MD  hydrochlorothiazide (HYDRODIURIL) 12.5 MG tablet Take 1 tablet (12.5 mg total) by mouth daily. 12/05/17  Yes Deboraha Sprang, MD  latanoprost (XALATAN) 0.005 % ophthalmic solution Place 1 drop into both eyes at bedtime.   Yes [provider]  levothyroxine (SYNTHROID, LEVOTHROID) 50 MCG tablet Take 1 tablet (50 mcg total) by mouth daily. 12/03/17  Yes Tower, [name], MD    metroNIDAZOLE (FLAGYL) 500 MG tablet Take 1 tablet (500 mg total) by mouth 2 (two) times daily. 04/04/18  Yes Martin, Mary-Margaret, FNP  mometasone (ELOCON) 0.1 % lotion as needed.  02/19/17  Yes [provider]  montelukast (SINGULAIR) 10 MG tablet Take 1 tablet (10 mg total) by mouth at bedtime. 02/05/18  Yes Chesley Mires, MD  Multiple Vitamins-Minerals (MULTIVITAMIN PO) Take 1 tablet by mouth daily.   Yes [provider]  NON FORMULARY HEMP/CBD OIL -takes a few drops or a pill prn   Yes [provider]  pantoprazole (PROTONIX) 40 MG tablet Take 1 tablet (40 mg total) by mouth 2 (two) times daily before a meal. 03/06/18  Yes Lemmon, Lavone Nian, PA  polyethylene glycol powder (GLYCOLAX/MIRALAX) powder Take 17 grams by mouth daily Patient taking differently: Take 17 grams by mouth PRN 02/27/16  Yes Pyrtle, Lajuan Lines, MD  potassium chloride (K-DUR) 10 MEQ tablet TAKE 1/2 TABLET (5MEQ TOTAL) EVERY DAY 12/03/17  Yes Tower, [name], MD  Probiotic Product (PROBIOTIC-10 PO) Take by mouth at bedtime.   Yes [provider]  Respiratory Therapy Supplies (FLUTTER) DEVI 1 application by Does not apply route 2 (two) times daily before lunch and supper. 02/05/18  Yes Chesley Mires, MD  simvastatin (ZOCOR) 40 MG tablet TAKE 1/2 TABLET  (20  MG  TOTAL) EVERY DAY 04/15/18  Yes Lyda Jester M, PA-C  traMADol (ULTRAM) 50 MG tablet Take 1 tablet (50 mg total) by mouth at bedtime. Patient taking differently: Take 50 mg by mouth as needed.  07/17/17  Yes Ofilia Neas, PA-C  zolpidem (AMBIEN) 10 MG tablet Take 1 tablet (10 mg total) by mouth at bedtime as  needed. for sleep Patient taking differently: Take 10 mg by mouth at bedtime. for sleep 11/27/17  Yes Tower, [name], MD  carvedilol (COREG) 12.5 MG tablet Take 0.5 tablets (6.25 mg total) by mouth 2 (two) times daily. 12/05/17 03/05/18  Deboraha Sprang, MD    ROS:  Out of a complete 14 system review of symptoms, the patient  complains only of the following symptoms, and all other reviewed systems are negative.  Frequency of urination Joint pain, neck pain  Blood pressure 110/60, pulse 69, height 5' 6.5" (1.689 m), weight 223 lb 8 oz (101.4 kg).  Physical Exam  General: The patient is alert and cooperative at the time of the examination.  The patient is moderately obese.  Neuromuscular: Elevation, internal and external rotation of the right shoulder does elicit some discomfort.  Skin: No significant peripheral edema is noted.   Neurologic Exam  Mental status: The patient is alert and oriented x 3 at the time of the examination. The patient has apparent normal recent and remote memory, with an apparently normal attention span and concentration ability.   Cranial nerves: Facial symmetry is present. Speech is normal, no aphasia or dysarthria is noted. Extraocular movements are full. Visual fields are full.  Motor: The patient has good strength in all 4 extremities.  Sensory examination: Soft touch sensation is symmetric on the face, arms, and legs.  Coordination: The patient has good finger-nose-finger and heel-to-shin bilaterally.  Gait and station: The patient has a normal gait. Tandem gait is normal. Romberg is negative. No drift is seen.  Reflexes: Deep tendon reflexes are symmetric.   Assessment/Plan:  1.  Fibromyalgia  2.  Right shoulder pain  The patient may have some intrinsic tendinopathy associated with the right shoulder.  The patient continues to have neck pain associated with cervical spondylosis.  She will start the gabapentin at 300 mg at night for 1 week and then try to go to 300 mg twice daily.  She will call for any dose adjustments.  The patient will follow-up in 6 months.  If desired in the future we may do an epidural steroid injection of the cervical spine.  Jill Alexanders MD 05/05/2018 3:35 PM  [facility] [address] [place], [place]  [address]  Phone [phone] Fax [phone]

## 2018-05-12 NOTE — Progress Notes (Signed)
Office Visit Note  Patient: [name]             Date of Birth: 1951-07-03           MRN: [id number]             PCP: Abner Greenspan, MD Referring: Tower, [name], MD Visit Date: 05/14/2018 Occupation: @GUAROCC @  Subjective:  Right shoulder and right hand pain.   History of Present Illness: [name] is a 67 y.o. female with history of fibromyalgia and osteoarthritis.  She states she has been having discomfort in her right shoulder and difficulty raising her arm for the last couple of weeks.  She also has noticed a knot on her right ring finger.  She has been having discomfort in her right knee joint.  She states she had a cortisone injection by Dr. Lorre Nick in May 2019.  The right trochanteric bursitis has improved.  The left total knee replacement is doing okay.  Activities of Daily Living:  Patient reports morning stiffness for 20 minutes.   Patient Reports nocturnal pain.  Difficulty dressing/grooming: Denies Difficulty climbing stairs: Reports Difficulty getting out of chair: Reports Difficulty using hands for taps, buttons, cutlery, and/or writing: Denies  Review of Systems  Constitutional: Positive for fatigue. Negative for night sweats, weight gain and weight loss.  HENT: Positive for mouth dryness. Negative for mouth sores, trouble swallowing, trouble swallowing and nose dryness.   Eyes: Positive for dryness. Negative for pain, redness and visual disturbance.  Respiratory: Negative for cough, shortness of breath and difficulty breathing.   Cardiovascular: Positive for palpitations. Negative for chest pain, hypertension, irregular heartbeat and swelling in legs/feet.  Gastrointestinal: Positive for constipation. Negative for blood in stool and diarrhea.  Endocrine: Negative for increased urination.  Genitourinary: Negative for vaginal dryness.  Musculoskeletal: Positive for arthralgias, joint pain, myalgias, morning stiffness and myalgias. Negative for joint swelling,  muscle weakness and muscle tenderness.  Skin: Negative for color change, rash, hair loss, skin tightness, ulcers and sensitivity to sunlight.  Allergic/Immunologic: Negative for susceptible to infections.  Neurological: Negative for dizziness, memory loss, night sweats and weakness.  Hematological: Negative for swollen glands.  Psychiatric/Behavioral: Positive for depressed mood and sleep disturbance. The patient is not nervous/anxious.     PMFS History:  Patient Active Problem List   Diagnosis Date Noted  . Pre-op exam 01/21/2018  . Neck pain 10/25/2017  . Paresthesias in left hand 10/25/2017  . Dry nose 07/19/2017  . Paresthesia of foot, bilateral 04/12/2017  . Itching 02/22/2017  . Primary osteoarthritis of both hands 09/27/2016  . Primary osteoarthritis of both knees 09/27/2016  . Urinary frequency 09/26/2016  . Screen for STD (sexually transmitted disease) 09/26/2016  . 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  . Nasopharyngitis, 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/2016  . Postnasal drip 05/18/2016  . S/P total knee replacement 02/13/2016  . Chronic pain of both knees 12/06/2015  . Globus pharyngeus 05/17/2015  . Productive cough 05/17/2015  . Genetic testing 12/23/2014  . Family history of breast cancer   . Family history of colon cancer   . Family history of pancreatic cancer   . Breast cancer of upper-outer quadrant of right female breast ([place]) 11/23/2014  . Allergy to ACE inhibitors 09/27/2014  . Encounter for Medicare annual wellness exam 06/27/2014  . Hyperglycemia 06/23/2013  . Routine general medical examination at a health care facility 06/23/2013  . RLS (restless legs syndrome) 01/15/2013  . Hx of Clostridium difficile infection 10/10/2012  .  Bronchiectasis without acute exacerbation ([place]) 04/29/2012  . Dyspnea on exertion 04/02/2012  . Non-ischemic cardiomyopathy ([place]) 03/07/2012  . Colon cancer screening 01/11/2012  . Post-menopausal 01/11/2012  . Chest pain 11/07/2011  .  Palpitations 04/17/2011  . Abnormal tympanic membrane 01/22/2011  . Obstructive sleep apnea 04/04/2010  . VENTRICULAR TACHYCARDIA 01/03/2009  . ICD  [employer]  Single chamber 01/03/2009  . PATENT FORAMEN OVALE 09/29/2008  . Vitamin D deficiency 08/17/2008  . Hypothyroidism 11/18/2006  . Hyperlipidemia 11/18/2006  . Depressed mood 11/18/2006  . Essential hypertension 11/18/2006  . MITRAL VALVE PROLAPSE 11/18/2006  . CEREBRAL ANEURYSM 11/18/2006  . Allergic rhinitis 11/18/2006  . GERD 11/18/2006  . DIVERTICULOSIS, COLON 11/18/2006  . Fatty liver 11/18/2006  . Fibromyalgia 11/18/2006    Past Medical History:  Diagnosis Date  . AICD (automatic cardioverter/defibrillator) present   . Anxiety   . Arthritis   . 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 Cerebral aneurysm, nonruptured    had a clip put in  . Clostridium difficile infection   . Depressive disorder, not elsewhere classified   . Diverticulosis of colon (without mention of hemorrhage)   . Esophageal reflux   . Fibromyalgia   . Gastritis   . GERD (gastroesophageal reflux disease)   . GI bleed 2004  . Hiatal hernia   . Hyperlipidemia   . Hypertension   . Hypothyroidism   . Internal hemorrhoids   . Obstructive sleep apnea (adult) (pediatric)   . Ostium secundum type atrial septal defect   . Other chronic nonalcoholic liver disease   . Other pulmonary embolism and infarction   . Paroxysmal ventricular tachycardia ([place])   . Personal history of radiation therapy   . Pneumonia    history  . Presence of permanent cardiac pacemaker   . PUD (peptic ulcer disease)   . Radiation 02/03/15-03/10/15   Right Breast  . Sarcoid    per pt , not sure  . Schatzki's ring   . Stroke [facility]) 2013   tia/ pt feels it was around 2008 0r 2009  . Takotsubo syndrome   . Tubular adenoma of colon   . Unspecified transient cerebral ischemia   . Unspecified vitamin D deficiency     Family History    Problem Relation Age of Onset  . Diabetes Mother   . Alzheimer's disease Mother   . Other Brother        Thyroid problem 10/2016  . Allergies Sister   . Heart disease Sister   . Heart disease Brother   . Prostate cancer Brother 64       same brother as throat cancer  . Throat cancer Brother        dx in his 95s; also a smoker  . Heart disease Father   . Cancer Maternal Grandmother 89       colon cancer or abdominal cancer  . Lung cancer Maternal Grandfather 21  . Breast cancer Maternal Aunt 35  . Colon cancer Maternal Aunt 28       same sister as breast at 60  . Lung cancer Maternal Uncle   . Breast cancer Maternal Aunt        dx in her 66s  . Pancreatic cancer Cousin 66       maternal first cousin  . Breast cancer Cousin        paternal first cousin twice removed died in her 95s  . Cardiomyopathy Unknown  Family history  . Heart disease Son        Cardiac Arrest 07/2016  . Stroke Neg Hx    Past Surgical History:  Procedure Laterality Date  . ABDOMINAL HYSTERECTOMY    . ABI  2006   normal  . BREAST EXCISIONAL BIOPSY    . BREAST LUMPECTOMY     right breast 2016  . BREAST LUMPECTOMY WITH RADIOACTIVE SEED LOCALIZATION Right 01/03/2015   Procedure: BREAST LUMPECTOMY WITH RADIOACTIVE SEED LOCALIZATION;  Surgeon: Autumn Messing III, MD;  Location: [facility];  Service: General;  Laterality: Right;  . CARDIAC DEFIBRILLATOR PLACEMENT  2006; 2012   BSX single chamber ICD  . carotid dopplers  2006   neg  . CEREBRAL ANEURYSM REPAIR  02/1999  . COLONOSCOPY    . hospitalization  2004   GI bleed, PUD, diverticulosis (EGD,colonscopy)  . hospitalization     PE, NSVT, s/p defib  . JOINT REPLACEMENT  2017   left knee   . KNEE ARTHROSCOPY     bilateral  . LEFT HEART CATHETERIZATION WITH CORONARY ANGIOGRAM N/A 02/22/2012   Procedure: LEFT HEART CATHETERIZATION WITH CORONARY ANGIOGRAM;  Surgeon: Burnell Blanks, MD;  Location: [facility] CATH LAB;  Service: Cardiovascular;  Laterality:  N/A;  . PARTIAL HYSTERECTOMY     Fibroids  . TONGUE BIOPSY  12/12/2017   due to sore tongue and white patches/abnormal cells  . TOTAL KNEE ARTHROPLASTY Left 02/13/2016   Procedure: TOTAL KNEE ARTHROPLASTY;  Surgeon: Vickey Huger, MD;  Location: [facility];  Service: Orthopedics;  Laterality: Left;  . UPPER GASTROINTESTINAL ENDOSCOPY     Social History   Social History Narrative   Lives alone   Caffeine GLO:VFIE   Retired from [employer]   2 children   Divorced   Right handed     Objective: Vital Signs: BP 128/78 (BP Location: Left Arm, Patient Position: Sitting, Cuff Size: Normal)   Pulse 81   Resp 17   Ht 5' 6.5" (1.689 m)   Wt 223 lb 6.4 oz (101.3 kg)   LMP  (LMP Unknown)   BMI 35.52 kg/m    Physical Exam  Constitutional: She is oriented to person, place, and time. She appears well-developed and well-nourished.  HENT:  Head: Normocephalic and atraumatic.  Eyes: Conjunctivae and EOM are normal.  Neck: Normal range of motion.  Cardiovascular: Normal rate, regular rhythm, normal heart sounds and intact distal pulses.  Pacemaker  Pulmonary/Chest: Effort normal and breath sounds normal.  Abdominal: Soft. Bowel sounds are normal.  Lymphadenopathy:    She has no cervical adenopathy.  Neurological: She is alert and oriented to person, place, and time.  Skin: Skin is warm and dry. Capillary refill takes less than 2 seconds.  Psychiatric: She has a normal mood and affect. Her behavior is normal.  Nursing note and vitals reviewed.    Musculoskeletal Exam: C-spine thoracic lumbar spine good range of motion.  She has some stiffness with range of motion of her cervical spine.  She has discomfort range of motion of her right shoulder joint.  Elbow joints wrist joint MCPs PIPs were in good range of motion.  She has some thickening of PIP and DIP joints.  A mucin cyst was present over her right fourth DIP joint.  Hip joints were in good range of motion.  Her left total knee replacement is  doing well.  She had no warmth in her right knee joint with painful range of motion.  She has generalized hyperalgesia and  positive tender points.  CDAI Exam: CDAI Score: Not documented Patient Global Assessment: Not documented; Provider Global Assessment: Not documented Swollen: Not documented; Tender: Not documented Joint Exam   Not documented   There is currently no information documented on the homunculus. Go to the Rheumatology activity and complete the homunculus joint exam.  Investigation: No additional findings.  Imaging: No results found.  Recent Labs: Lab Results  Component Value Date   WBC 4.1 11/27/2017   HGB 12.9 11/27/2017   PLT 205.0 11/27/2017   NA 140 01/30/2018   K 3.5 01/30/2018   CL 103 01/30/2018   CO2 29 01/30/2018   GLUCOSE 90 01/30/2018   BUN 9 01/30/2018   CREATININE 0.80 01/30/2018   BILITOT 0.4 01/30/2018   ALKPHOS 115 01/30/2018   AST 36 01/30/2018   ALT 39 (H) 01/30/2018   PROT 7.9 01/30/2018   ALBUMIN 4.2 01/30/2018   CALCIUM 10.1 01/30/2018   GFRAA 85 02/27/2017    Speciality Comments: Narc Agreement: 01/16/18  Procedures:  Large Joint Inj: R glenohumeral on 05/14/2018 11:54 AM Indications: pain Details: 27 G 1.5 in needle, posterior approach  Arthrogram: No  Medications: 1 mL lidocaine 1 %; 40 mg triamcinolone acetonide 40 MG/ML Aspirate: 0 mL Outcome: tolerated well, no immediate complications Procedure, treatment alternatives, risks and benefits explained, specific risks discussed. Consent was given by the patient. Immediately prior to procedure a time out was called to verify the correct patient, procedure, equipment, support staff and site/side marked as required. Patient was prepped and draped in the usual sterile fashion.   Large Joint Inj: R knee on 05/14/2018 11:55 AM Indications: pain Details: 27 G 1.5 in needle, medial approach  Arthrogram: No  Medications: 40 mg triamcinolone acetonide 40 MG/ML; 1.5 mL lidocaine 1  % Aspirate: 0 mL Outcome: tolerated well, no immediate complications Procedure, treatment alternatives, risks and benefits explained, specific risks discussed. Consent was given by the patient. Immediately prior to procedure a time out was called to verify the correct patient, procedure, equipment, support staff and site/side marked as required. Patient was prepped and draped in the usual sterile fashion.     Allergies: Ace inhibitors; Dilantin [phenytoin sodium extended]; Atorvastatin; Ramipril; Rosuvastatin; Iodine-131; Paroxetine; Wellbutrin [bupropion]; Amitriptyline hcl; Carbamazepine; Codeine; and Phenytoin sodium extended   Assessment / Plan:     Visit Diagnoses: Fibromyalgia -patient has been experiencing generalized pain and positive tender points.  She has been taking tramadol which is helpful.  Tramadol. UDS: 5/30/2019Narc agreement: 01/16/2018  Chronic right shoulder pain-she has increased pain and discomfort in her right shoulder joint.  She has difficulty raising her arm.  After informed consent was obtained right shoulder joint was injected with cortisone as described above.  I have also given her a handout on shoulder joint exercises.  If she has ongoing discomfort she still is supposed to notify us.  Primary osteoarthritis of both hands-she has mild osteoarthritic changes.  She has right fourth finger DIP mucin cyst.  We will observe it for right now.  Primary osteoarthritis of right knee-she is been having increased pain and discomfort in her right knee joint.  After informed consent was obtained right knee joint was injected with cortisone per request.  Have advised her to monitor blood pressure closely.  Knee joint exercises were also given.  She has been doing some water exercises.  Status post total knee replacement, left-doing well  Trochanteric bursitis, right hip-doing better.  DDD (degenerative disc disease), cervical-she continues to have some C-spine discomfort.  She  has been followed by Dr. Jannifer Franklin.  History of gastroesophageal reflux (GERD)  History of TIA (transient ischemic attack)  Cardiac arrhythmia, unspecified cardiac arrhythmia type - S/P pacer  History of breast cancer  History of hypertension  History of hypothyroidism  Brain aneurysm  History of depression  History of sleep apnea - uses CPAP  History of migraine  History of hypercholesterolemia  History of vitamin D deficiency  History of diverticulosis   Orders: Orders Placed This Encounter  Procedures  . Large Joint Inj  . Large Joint Inj   Meds ordered this encounter  Medications  . traMADol (ULTRAM) 50 MG tablet    Sig: Take 1 tablet (50 mg total) by mouth at bedtime.    Dispense:  30 tablet    Refill:  1    Face-to-face time spent with patient was 30 minutes. Greater than 50% of time was spent in counseling and coordination of care.  Follow-Up Instructions: Return in about 6 months (around 11/12/2018) for FMS, OA, DDD.   Bo Merino, MD  Note - This record has been created using Editor, commissioning.  Chart creation errors have been sought, but may not always  have been located. Such creation errors do not reflect on  the standard of medical care.

## 2018-05-13 NOTE — Progress Notes (Addendum)
   Patient has [employer] ICD - orders faxed 05/13/18.  Called and spoke with representative from [employer] [place]).  Joey requested that we call him day of surgery (if he is needed by MD), 30 minutes prior to procedure and he will be available to assist.  Requests we call his direct # -[phone]

## 2018-05-13 NOTE — Pre-Procedure Instructions (Signed)
[name]  05/13/2018      [facility] [address], [place] [address] Phone: [phone] Fax: [phone]  [facility] [place], [place] [address] Phone: [phone] Fax: [phone]  [id number] [address], [place] [address] Phone: [phone] Fax: [phone]    Your procedure is scheduled on May 22, 2018.  Report to [facility] Admitting at 530 AM.  Call this number if you have problems the morning of surgery:  [phone]   Remember:  Do not eat after midnight.  You may drink clear liquids until 430 AM-3 hours prior to surgery start time.  Clear liquids allowed are:   Water, Juice (non-citric and without pulp), Carbonated beverages, Clear Tea, Black Coffee only, Plain Jell-O only, Gatorade and Plain Popsicles only    Take these medicines the morning of surgery with A SIP OF WATER  Carvedilol (coreg) Tylenol-if needed Albuterol inhaler-if needed (bring inhaler with you) Baclofen (lioresal)-if needed for muscle spasms Cetirizine (zyrtec) escitalopram (lexapro) Fexofenadine (allegra)-if needed for allergies flonase nasal spray-if needed Eye drops-if needed Levothyroxine (synthroid) Pantoprazole (protonix) Tramadol-if needed for pain  Follow your surgeon's instructions on when to hold/resume aspirin.  If no instructions were given call the office to determine how they would like to you take aspirin  7 days prior to surgery STOP taking any Aleve, Naproxen, Ibuprofen, Motrin, Advil, Goody's, BC's, all herbal medications, fish oil, and all vitamins   Contacts, dentures or bridgework may not be worn into surgery.  Leave your suitcase in the car.  After surgery it may be brought to your room.  For patients admitted to the hospital,  discharge time will be determined by your treatment team.  Patients discharged the day of surgery will not be allowed to drive home.    [facility]- Preparing For Surgery  Before surgery, you can play an important role. Because skin is not sterile, your skin needs to be as free of germs as possible. You can reduce the number of germs on your skin by washing with CHG (chlorahexidine gluconate) Soap before surgery.  CHG is an antiseptic cleaner which kills germs and bonds with the skin to continue killing germs even after washing.    Oral Hygiene is also important to reduce your risk of infection.  Remember - BRUSH YOUR TEETH THE MORNING OF SURGERY WITH YOUR REGULAR TOOTHPASTE  Please do not use if you have an allergy to CHG or antibacterial soaps. If your skin becomes reddened/irritated stop using the CHG.  Do not shave (including legs and underarms) for at least 48 hours prior to first CHG shower. It is OK to shave your face.  Please follow these instructions carefully.   1. Shower the NIGHT BEFORE SURGERY and the MORNING OF SURGERY with CHG.   2. If you chose to wash your hair, wash your hair first as usual with your normal shampoo.  3. After you shampoo, rinse your hair and body thoroughly to remove the shampoo.  4. Use CHG as you would any other liquid soap. You can apply CHG directly to the skin and wash gently with a scrungie or a clean washcloth.   5. Apply the CHG Soap to your body ONLY FROM THE NECK DOWN.  Do not use on open wounds or open sores. Avoid  contact with your eyes, ears, mouth and genitals (private parts). Wash Face and genitals (private parts)  with your normal soap.  6. Wash thoroughly, paying special attention to the area where your surgery will be performed.  7. Thoroughly rinse your body with warm water from the neck down.  8. DO NOT shower/wash with your normal soap after using and rinsing off the CHG Soap.  9. Pat yourself dry with a CLEAN TOWEL.  10. Wear  CLEAN PAJAMAS to bed the night before surgery, wear comfortable clothes the morning of surgery  11. Place CLEAN SHEETS on your bed the night of your first shower and DO NOT SLEEP WITH PETS.  Day of Surgery:  Do not apply any deodorants/lotions.  Please wear clean clothes to the hospital/surgery center.   Remember to brush your teeth WITH YOUR REGULAR TOOTHPASTE.   Do not wear jewelry, make-up or nail polish.  Do not wear lotions, powders, or perfumes, or deodorant.  Do not shave 48 hours prior to surgery.    Do not bring valuables to the hospital.   [facility] is not responsible for any belongings or valuables. Please read over the following fact sheets that you were given.

## 2018-05-14 ENCOUNTER — Ambulatory Visit (INDEPENDENT_AMBULATORY_CARE_PROVIDER_SITE_OTHER): Payer: Medicare Other | Admitting: Rheumatology

## 2018-05-14 ENCOUNTER — Inpatient Hospital Stay (HOSPITAL_COMMUNITY)
Admission: RE | Admit: 2018-05-14 | Discharge: 2018-05-14 | Disposition: A | Discharge status: Home / Self Care | Payer: Self-pay | Source: Ambulatory Visit

## 2018-05-14 ENCOUNTER — Encounter: Payer: Self-pay | Admitting: Rheumatology

## 2018-05-14 ENCOUNTER — Telehealth: Payer: Self-pay | Admitting: Rheumatology

## 2018-05-14 ENCOUNTER — Telehealth: Payer: Self-pay

## 2018-05-14 VITALS — BP 128/78 | HR 81 | Resp 17 | Ht 66.5 in | Wt 223.4 lb

## 2018-05-14 DIAGNOSIS — M797 Fibromyalgia: Secondary | ICD-10-CM | POA: Diagnosis not present

## 2018-05-14 DIAGNOSIS — G8929 Other chronic pain: Secondary | ICD-10-CM | POA: Diagnosis not present

## 2018-05-14 DIAGNOSIS — Z96652 Presence of left artificial knee joint: Secondary | ICD-10-CM | POA: Diagnosis not present

## 2018-05-14 DIAGNOSIS — Z8639 Personal history of other endocrine, nutritional and metabolic disease: Secondary | ICD-10-CM

## 2018-05-14 DIAGNOSIS — Z8673 Personal history of transient ischemic attack (TIA), and cerebral infarction without residual deficits: Secondary | ICD-10-CM

## 2018-05-14 DIAGNOSIS — M1711 Unilateral primary osteoarthritis, right knee: Secondary | ICD-10-CM

## 2018-05-14 DIAGNOSIS — Z8659 Personal history of other mental and behavioral disorders: Secondary | ICD-10-CM

## 2018-05-14 DIAGNOSIS — I499 Cardiac arrhythmia, unspecified: Secondary | ICD-10-CM

## 2018-05-14 DIAGNOSIS — M25511 Pain in right shoulder: Secondary | ICD-10-CM

## 2018-05-14 DIAGNOSIS — Z853 Personal history of malignant neoplasm of breast: Secondary | ICD-10-CM

## 2018-05-14 DIAGNOSIS — M5030 Other cervical disc degeneration, unspecified cervical region: Secondary | ICD-10-CM

## 2018-05-14 DIAGNOSIS — M19041 Primary osteoarthritis, right hand: Secondary | ICD-10-CM | POA: Diagnosis not present

## 2018-05-14 DIAGNOSIS — Z8679 Personal history of other diseases of the circulatory system: Secondary | ICD-10-CM

## 2018-05-14 DIAGNOSIS — M7061 Trochanteric bursitis, right hip: Secondary | ICD-10-CM

## 2018-05-14 DIAGNOSIS — M19042 Primary osteoarthritis, left hand: Secondary | ICD-10-CM

## 2018-05-14 DIAGNOSIS — Z8669 Personal history of other diseases of the nervous system and sense organs: Secondary | ICD-10-CM

## 2018-05-14 DIAGNOSIS — I671 Cerebral aneurysm, nonruptured: Secondary | ICD-10-CM

## 2018-05-14 DIAGNOSIS — Z8719 Personal history of other diseases of the digestive system: Secondary | ICD-10-CM

## 2018-05-14 MED ORDER — TRIAMCINOLONE ACETONIDE 40 MG/ML IJ SUSP
40.0000 mg | INTRAMUSCULAR | Status: AC | PRN
  Administered 2018-05-14: 40 mg via INTRA_ARTICULAR

## 2018-05-14 MED ORDER — LIDOCAINE HCL 1 % IJ SOLN
1.0000 mL | INTRAMUSCULAR | Status: AC | PRN
  Administered 2018-05-14: 1 mL

## 2018-05-14 MED ORDER — LIDOCAINE HCL 1 % IJ SOLN
1.5000 mL | INTRAMUSCULAR | Status: AC | PRN
  Administered 2018-05-14: 1.5 mL

## 2018-05-14 MED ORDER — TRAMADOL HCL 50 MG PO TABS: 50.0000 mg | ORAL_TABLET | Freq: Every day | ORAL | 1 refills | Status: DC

## 2018-05-14 NOTE — Patient Instructions (Addendum)
Knee Exercises Ask your health care provider which exercises are safe for you. Do exercises exactly as told by your health care provider and adjust them as directed. It is normal to feel mild stretching, pulling, tightness, or discomfort as you do these exercises, but you should stop right away if you feel sudden pain or your pain gets worse.Do not begin these exercises until told by your health care provider. STRETCHING AND RANGE OF MOTION EXERCISES These exercises warm up your muscles and joints and improve the movement and flexibility of your knee. These exercises also help to relieve pain, numbness, and tingling. Exercise A: Knee Extension, Prone 1. Lie on your abdomen on a bed. 2. Place your left / right knee just beyond the edge of the surface so your knee is not on the bed. You can put a towel under your left / right thigh just above your knee for comfort. 3. Relax your leg muscles and allow gravity to straighten your knee. You should feel a stretch behind your left / right knee. 4. Hold this position for __________ seconds. 5. Scoot up so your knee is supported between repetitions. Repeat __________ times. Complete this stretch __________ times a day. Exercise B: Knee Flexion, Active  1. Lie on your back with both knees straight. If this causes back discomfort, bend your left / right knee so your foot is flat on the floor. 2. Slowly slide your left / right heel back toward your buttocks until you feel a gentle stretch in the front of your knee or thigh. 3. Hold this position for __________ seconds. 4. Slowly slide your left / right heel back to the starting position. Repeat __________ times. Complete this exercise __________ times a day. Exercise C: Quadriceps, Prone  1. Lie on your abdomen on a firm surface, such as a bed or padded floor. 2. Bend your left / right knee and hold your ankle. If you cannot reach your ankle or pant leg, loop a belt around your foot and grab the belt  instead. 3. Gently pull your heel toward your buttocks. Your knee should not slide out to the side. You should feel a stretch in the front of your thigh and knee. 4. Hold this position for __________ seconds. Repeat __________ times. Complete this stretch __________ times a day. Exercise D: Hamstring, Supine 1. Lie on your back. 2. Loop a belt or towel over the ball of your left / right foot. The ball of your foot is on the walking surface, right under your toes. 3. Straighten your left / right knee and slowly pull on the belt to raise your leg until you feel a gentle stretch behind your knee. ? Do not let your left / right knee bend while you do this. ? Keep your other leg flat on the floor. 4. Hold this position for __________ seconds. Repeat __________ times. Complete this stretch __________ times a day. STRENGTHENING EXERCISES These exercises build strength and endurance in your knee. Endurance is the ability to use your muscles for a long time, even after they get tired. Exercise E: Quadriceps, Isometric  1. Lie on your back with your left / right leg extended and your other knee bent. Put a rolled towel or small pillow under your knee if told by your health care provider. 2. Slowly tense the muscles in the front of your left / right thigh. You should see your kneecap slide up toward your hip or see increased dimpling just above the knee. This   motion will push the back of the knee toward the floor. 3. For __________ seconds, keep the muscle as tight as you can without increasing your pain. 4. Relax the muscles slowly and completely. Repeat __________ times. Complete this exercise __________ times a day. Exercise F: Straight Leg Raises - Quadriceps 1. Lie on your back with your left / right leg extended and your other knee bent. 2. Tense the muscles in the front of your left / right thigh. You should see your kneecap slide up or see increased dimpling just above the knee. Your thigh may  even shake a bit. 3. Keep these muscles tight as you raise your leg 4-6 inches (10-15 cm) off the floor. Do not let your knee bend. 4. Hold this position for __________ seconds. 5. Keep these muscles tense as you lower your leg. 6. Relax your muscles slowly and completely after each repetition. Repeat __________ times. Complete this exercise __________ times a day. Exercise G: Hamstring, Isometric 1. Lie on your back on a firm surface. 2. Bend your left / right knee approximately __________ degrees. 3. Dig your left / right heel into the surface as if you are trying to pull it toward your buttocks. Tighten the muscles in the back of your thighs to dig as hard as you can without increasing any pain. 4. Hold this position for __________ seconds. 5. Release the tension gradually and allow your muscles to relax completely for __________ seconds after each repetition. Repeat __________ times. Complete this exercise __________ times a day. Exercise H: Hamstring Curls  If told by your health care provider, do this exercise while wearing ankle weights. Begin with __________ weights. Then increase the weight by 1 lb (0.5 kg) increments. Do not wear ankle weights that are more than __________. 1. Lie on your abdomen with your legs straight. 2. Bend your left / right knee as far as you can without feeling pain. Keep your hips flat against the floor. 3. Hold this position for __________ seconds. 4. Slowly lower your leg to the starting position.  Repeat __________ times. Complete this exercise __________ times a day. Exercise I: Squats (Quadriceps) 1. Stand in front of a table, with your feet and knees pointing straight ahead. You may rest your hands on the table for balance but not for support. 2. Slowly bend your knees and lower your hips like you are going to sit in a chair. ? Keep your weight over your heels, not over your toes. ? Keep your lower legs upright so they are parallel with the table  legs. ? Do not let your hips go lower than your knees. ? Do not bend lower than told by your health care provider. ? If your knee pain increases, do not bend as low. 3. Hold the squat position for __________ seconds. 4. Slowly push with your legs to return to standing. Do not use your hands to pull yourself to standing. Repeat __________ times. Complete this exercise __________ times a day. Exercise J: Wall Slides (Quadriceps)  1. Lean your back against a smooth wall or door while you walk your feet out 18-24 inches (46-61 cm) from it. 2. Place your feet hip-width apart. 3. Slowly slide down the wall or door until your knees bend __________ degrees. Keep your knees over your heels, not over your toes. Keep your knees in line with your hips. 4. Hold for __________ seconds. Repeat __________ times. Complete this exercise __________ times a day. Exercise K: Straight Leg Raises -   Hip Abductors 1. Lie on your side with your left / right leg in the top position. Lie so your head, shoulder, knee, and hip line up. You may bend your bottom knee to help you keep your balance. 2. Roll your hips slightly forward so your hips are stacked directly over each other and your left / right knee is facing forward. 3. Leading with your heel, lift your top leg 4-6 inches (10-15 cm). You should feel the muscles in your outer hip lifting. ? Do not let your foot drift forward. ? Do not let your knee roll toward the ceiling. 4. Hold this position for __________ seconds. 5. Slowly return your leg to the starting position. 6. Let your muscles relax completely after each repetition. Repeat __________ times. Complete this exercise __________ times a day. Exercise L: Straight Leg Raises - Hip Extensors 1. Lie on your abdomen on a firm surface. You can put a pillow under your hips if that is more comfortable. 2. Tense the muscles in your buttocks and lift your left / right leg about 4-6 inches (10-15 cm). Keep your knee  straight as you lift your leg. 3. Hold this position for __________ seconds. 4. Slowly lower your leg to the starting position. 5. Let your leg relax completely after each repetition. Repeat __________ times. Complete this exercise __________ times a day. This information is not intended to replace advice given to you by your health care provider. Make sure you discuss any questions you have with your health care provider. Document Released: 06/20/2005 Document Revised: 04/30/2016 Document Reviewed: 06/12/2015 Elsevier Interactive Patient Education  2018 [employer]. Shoulder Exercises Ask your health care provider which exercises are safe for you. Do exercises exactly as told by your health care provider and adjust them as directed. It is normal to feel mild stretching, pulling, tightness, or discomfort as you do these exercises, but you should stop right away if you feel sudden pain or your pain gets worse.Do not begin these exercises until told by your health care provider. RANGE OF MOTION EXERCISES These exercises warm up your muscles and joints and improve the movement and flexibility of your shoulder. These exercises also help to relieve pain, numbness, and tingling. These exercises involve stretching your injured shoulder directly. Exercise A: Pendulum  1. Stand near a wall or a surface that you can hold onto for balance. 2. Bend at the waist and let your left / right arm hang straight down. Use your other arm to support you. Keep your back straight and do not lock your knees. 3. Relax your left / right arm and shoulder muscles, and move your hips and your trunk so your left / right arm swings freely. Your arm should swing because of the motion of your body, not because you are using your arm or shoulder muscles. 4. Keep moving your body so your arm swings in the following directions, as told by your health care provider: ? Side to side. ? Forward and backward. ? In clockwise and  counterclockwise circles. 5. Continue each motion for __________ seconds, or for as long as told by your health care provider. 6. Slowly return to the starting position. Repeat __________ times. Complete this exercise __________ times a day. Exercise B:Flexion, Standing  1. Stand and hold a broomstick, a cane, or a similar object. Place your hands a little more than shoulder-width apart on the object. Your left / right hand should be palm-up, and your other hand should be palm-down. 2.  Keep your elbow straight and keep your shoulder muscles relaxed. Push the stick down with your healthy arm to raise your left / right arm in front of your body, and then over your head until you feel a stretch in your shoulder. ? Avoid shrugging your shoulder while you raise your arm. Keep your shoulder blade tucked down toward the middle of your back. 3. Hold for __________ seconds. 4. Slowly return to the starting position. Repeat __________ times. Complete this exercise __________ times a day. Exercise C: Abduction, Standing 1. Stand and hold a broomstick, a cane, or a similar object. Place your hands a little more than shoulder-width apart on the object. Your left / right hand should be palm-up, and your other hand should be palm-down. 2. While keeping your elbow straight and your shoulder muscles relaxed, push the stick across your body toward your left / right side. Raise your left / right arm to the side of your body and then over your head until you feel a stretch in your shoulder. ? Do not raise your arm above shoulder height, unless your health care provider tells you to do that. ? Avoid shrugging your shoulder while you raise your arm. Keep your shoulder blade tucked down toward the middle of your back. 3. Hold for __________ seconds. 4. Slowly return to the starting position. Repeat __________ times. Complete this exercise __________ times a day. Exercise D:Internal Rotation  1. Place your left /  right hand behind your back, palm-up. 2. Use your other hand to dangle an exercise band, a towel, or a similar object over your shoulder. Grasp the band with your left / right hand so you are holding onto both ends. 3. Gently pull up on the band until you feel a stretch in the front of your left / right shoulder. ? Avoid shrugging your shoulder while you raise your arm. Keep your shoulder blade tucked down toward the middle of your back. 4. Hold for __________ seconds. 5. Release the stretch by letting go of the band and lowering your hands. Repeat __________ times. Complete this exercise __________ times a day. STRETCHING EXERCISES These exercises warm up your muscles and joints and improve the movement and flexibility of your shoulder. These exercises also help to relieve pain, numbness, and tingling. These exercises are done using your healthy shoulder to help stretch the muscles of your injured shoulder. Exercise E: Warehouse manager (External Rotation and Abduction)  1. Stand in a doorway with one of your feet slightly in front of the other. This is called a staggered stance. If you cannot reach your forearms to the door frame, stand facing a corner of a room. 2. Choose one of the following positions as told by your health care provider: ? Place your hands and forearms on the door frame above your head. ? Place your hands and forearms on the door frame at the height of your head. ? Place your hands on the door frame at the height of your elbows. 3. Slowly move your weight onto your front foot until you feel a stretch across your chest and in the front of your shoulders. Keep your head and chest upright and keep your abdominal muscles tight. 4. Hold for __________ seconds. 5. To release the stretch, shift your weight to your back foot. Repeat __________ times. Complete this stretch __________ times a day. Exercise F:Extension, Standing 1. Stand and hold a broomstick, a cane, or a similar  object behind your back. ? Your hands  should be a little wider than shoulder-width apart. ? Your palms should face away from your back. 2. Keeping your elbows straight and keeping your shoulder muscles relaxed, move the stick away from your body until you feel a stretch in your shoulder. ? Avoid shrugging your shoulders while you move the stick. Keep your shoulder blade tucked down toward the middle of your back. 3. Hold for __________ seconds. 4. Slowly return to the starting position. Repeat __________ times. Complete this exercise __________ times a day. STRENGTHENING EXERCISES These exercises build strength and endurance in your shoulder. Endurance is the ability to use your muscles for a long time, even after they get tired. Exercise G:External Rotation  1. Sit in a stable chair without armrests. 2. Secure an exercise band at elbow height on your left / right side. 3. Place a soft object, such as a folded towel or a small pillow, between your left / right upper arm and your body to move your elbow a few inches away (about 10 cm) from your side. 4. Hold the end of the band so it is tight and there is no slack. 5. Keeping your elbow pressed against the soft object, move your left / right forearm out, away from your abdomen. Keep your body steady so only your forearm moves. 6. Hold for __________ seconds. 7. Slowly return to the starting position. Repeat __________ times. Complete this exercise __________ times a day. Exercise H:Shoulder Abduction  1. Sit in a stable chair without armrests, or stand. 2. Hold a __________ weight in your left / right hand, or hold an exercise band with both hands. 3. Start with your arms straight down and your left / right palm facing in, toward your body. 4. Slowly lift your left / right hand out to your side. Do not lift your hand above shoulder height unless your health care provider tells you that this is safe. ? Keep your arms straight. ? Avoid  shrugging your shoulder while you do this movement. Keep your shoulder blade tucked down toward the middle of your back. 5. Hold for __________ seconds. 6. Slowly lower your arm, and return to the starting position. Repeat __________ times. Complete this exercise __________ times a day. Exercise I:Shoulder Extension 1. Sit in a stable chair without armrests, or stand. 2. Secure an exercise band to a stable object in front of you where it is at shoulder height. 3. Hold one end of the exercise band in each hand. Your palms should face each other. 4. Straighten your elbows and lift your hands up to shoulder height. 5. Step back, away from the secured end of the exercise band, until the band is tight and there is no slack. 6. Squeeze your shoulder blades together as you pull your hands down to the sides of your thighs. Stop when your hands are straight down by your sides. Do not let your hands go behind your body. 7. Hold for __________ seconds. 8. Slowly return to the starting position. Repeat __________ times. Complete this exercise __________ times a day. Exercise J:Standing Shoulder Row 1. Sit in a stable chair without armrests, or stand. 2. Secure an exercise band to a stable object in front of you so it is at waist height. 3. Hold one end of the exercise band in each hand. Your palms should be in a thumbs-up position. 4. Bend each of your elbows to an "L" shape (about 90 degrees) and keep your upper arms at your sides. 5. Step back  until the band is tight and there is no slack. 6. Slowly pull your elbows back behind you. 7. Hold for __________ seconds. 8. Slowly return to the starting position. Repeat __________ times. Complete this exercise __________ times a day. Exercise K:Shoulder Press-Ups  1. Sit in a stable chair that has armrests. Sit upright, with your feet flat on the floor. 2. Put your hands on the armrests so your elbows are bent and your fingers are pointing forward. Your  hands should be about even with the sides of your body. 3. Push down on the armrests and use your arms to lift yourself off of the chair. Straighten your elbows and lift yourself up as much as you comfortably can. ? Move your shoulder blades down, and avoid letting your shoulders move up toward your ears. ? Keep your feet on the ground. As you get stronger, your feet should support less of your body weight as you lift yourself up. 4. Hold for __________ seconds. 5. Slowly lower yourself back into the chair. Repeat __________ times. Complete this exercise __________ times a day. Exercise L: Wall Push-Ups  1. Stand so you are facing a stable wall. Your feet should be about one arm-length away from the wall. 2. Lean forward and place your palms on the wall at shoulder height. 3. Keep your feet flat on the floor as you bend your elbows and lean forward toward the wall. 4. Hold for __________ seconds. 5. Straighten your elbows to push yourself back to the starting position. Repeat __________ times. Complete this exercise __________ times a day. This information is not intended to replace advice given to you by your health care provider. Make sure you discuss any questions you have with your health care provider. Document Released: 06/20/2005 Document Revised: 04/30/2016 Document Reviewed: 04/17/2015 Elsevier Interactive Patient Education  [employer].  Return in November for labs.

## 2018-05-14 NOTE — Telephone Encounter (Signed)
Jeani Hawking, pharmacist from [place] [facility] left a voicemail requesting diagnosis information for the pain medication Tramadol 50 mg.  Please return call and let us know if this is for acute or chronic treatment.  [phone]

## 2018-05-14 NOTE — Telephone Encounter (Signed)
Tramadol prescription refill was faxed to [facility] on [address]. The prescription states "print" and lists [employer] (this is incorrect). Just an [place]

## 2018-05-14 NOTE — Telephone Encounter (Signed)
Spoke with Jeani Hawking and advised that Tramadol is being given for chronic conditions Fibromyalgia and chronic right shoulder pain.

## 2018-05-21 ENCOUNTER — Other Ambulatory Visit: Payer: Self-pay | Admitting: Family Medicine

## 2018-05-21 NOTE — Telephone Encounter (Signed)
Is she a week early for this or am I wrong?

## 2018-05-21 NOTE — Telephone Encounter (Signed)
Name of Medication: Ambien Name of Pharmacy: [facility] [address] Last Fill or Written Date and Quantity: 11/27/17 #90 tabs with 1 additional refill Last Office Visit and Type: acute appt with Webb Silversmith, NP on 04/01/18 Next Office Visit and Type: CPE on 12/09/18 Last Controlled Substance Agreement Date: 12/05/16 Last UDS: Done at Dr. Arlean Hopping office on 01/16/18

## 2018-05-22 ENCOUNTER — Ambulatory Visit: Admit: 2018-05-22 | Payer: Medicare Other | Admitting: General Surgery

## 2018-05-22 SURGERY — REPAIR, HERNIA, VENTRAL, LAPAROSCOPIC
Anesthesia: General

## 2018-05-25 ENCOUNTER — Other Ambulatory Visit: Payer: Self-pay | Admitting: Family Medicine

## 2018-05-26 NOTE — Telephone Encounter (Signed)
Name of Medication: Ambien Name of Pharmacy: [facility] or Written Date and Quantity: 11/27/17 #90 tabs with 1 additional refill Last Office Visit and Type: acute appt with Webb Silversmith, NP on 04/01/18 Next Office Visit and Type: CPE on 12/09/18 Last Controlled Substance Agreement Date: 11/30/16 Last UDS:11/30/16

## 2018-06-04 ENCOUNTER — Ambulatory Visit (INDEPENDENT_AMBULATORY_CARE_PROVIDER_SITE_OTHER): Payer: Medicare Other | Admitting: *Deleted

## 2018-06-04 DIAGNOSIS — I472 Ventricular tachycardia, unspecified: Secondary | ICD-10-CM

## 2018-06-04 DIAGNOSIS — I428 Other cardiomyopathies: Secondary | ICD-10-CM

## 2018-06-04 NOTE — Progress Notes (Signed)
Remote ICD transmission.   

## 2018-06-06 ENCOUNTER — Encounter: Payer: Self-pay | Admitting: Cardiology

## 2018-06-18 ENCOUNTER — Encounter: Payer: Self-pay | Admitting: Podiatry

## 2018-06-18 ENCOUNTER — Ambulatory Visit (INDEPENDENT_AMBULATORY_CARE_PROVIDER_SITE_OTHER): Payer: Medicare Other | Admitting: Podiatry

## 2018-06-18 ENCOUNTER — Ambulatory Visit (INDEPENDENT_AMBULATORY_CARE_PROVIDER_SITE_OTHER): Payer: Medicare Other

## 2018-06-18 ENCOUNTER — Other Ambulatory Visit: Payer: Self-pay | Admitting: Podiatry

## 2018-06-18 DIAGNOSIS — M2042 Other hammer toe(s) (acquired), left foot: Secondary | ICD-10-CM | POA: Diagnosis not present

## 2018-06-18 DIAGNOSIS — M2041 Other hammer toe(s) (acquired), right foot: Secondary | ICD-10-CM

## 2018-06-18 DIAGNOSIS — L84 Corns and callosities: Secondary | ICD-10-CM

## 2018-06-18 DIAGNOSIS — M778 Other enthesopathies, not elsewhere classified: Secondary | ICD-10-CM

## 2018-06-18 DIAGNOSIS — M779 Enthesopathy, unspecified: Secondary | ICD-10-CM | POA: Diagnosis not present

## 2018-06-19 NOTE — Progress Notes (Signed)
Subjective:   Patient ID: [name], female   DOB: 67 y.o.   MRN: [id number]   HPI Patient presents with inflammation of the inside of the big toe left and keratotic lesions that are very painful bottom of both feet make it hard to walk comfortably.  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

## 2018-06-24 ENCOUNTER — Ambulatory Visit: Payer: Medicare Other | Admitting: Rheumatology

## 2018-07-11 ENCOUNTER — Ambulatory Visit (INDEPENDENT_AMBULATORY_CARE_PROVIDER_SITE_OTHER): Payer: Medicare Other | Admitting: Family Medicine

## 2018-07-11 ENCOUNTER — Encounter: Payer: Self-pay | Admitting: Family Medicine

## 2018-07-11 VITALS — BP 128/70 | HR 64 | Temp 98.5°F | Ht 66.5 in | Wt 221.5 lb

## 2018-07-11 DIAGNOSIS — J069 Acute upper respiratory infection, unspecified: Secondary | ICD-10-CM

## 2018-07-11 DIAGNOSIS — B9789 Other viral agents as the cause of diseases classified elsewhere: Secondary | ICD-10-CM

## 2018-07-11 MED ORDER — DOXYCYCLINE HYCLATE 100 MG PO TABS: 100.0000 mg | ORAL_TABLET | Freq: Two times a day (BID) | ORAL | 0 refills | Status: DC

## 2018-07-11 MED ORDER — BENZONATATE 200 MG PO CAPS: 200.0000 mg | ORAL_CAPSULE | Freq: Three times a day (TID) | ORAL | 1 refills | Status: DC | PRN

## 2018-07-11 NOTE — Progress Notes (Signed)
Subjective:    Patient ID: [name], female    DOB: 07-22-1951, 67 y.o.   MRN: [id number]  HPI Here for c/o nasal congestion and cough   H/o bronchiectasis  Pulse ox 93% (ambulating RA)  Started last week -nose runny  Then dry nose and throat  Then cough  Now productive-yellow (bright yellow) - feels like she cannot clear totally   No fever Does not feel bad   Drinking lots of fluids   Takes claritin in am and allegra at night  singulair  Has ventolin-does not use it much  Not using flonase as much    Taking liquid mucinex (multi symptom cold )  Saline - ayr brand for nose   Patient Active Problem List   Diagnosis Date Noted  . Viral URI with cough 07/11/2018  . Pre-op exam 01/21/2018  . Neck pain 10/25/2017  . Paresthesias in left hand 10/25/2017  . Dry nose 07/19/2017  . Paresthesia of foot, bilateral 04/12/2017  . Itching 02/22/2017  . Primary osteoarthritis of both hands 09/27/2016  . Primary osteoarthritis of both knees 09/27/2016  . Urinary frequency 09/26/2016  . Screen for STD (sexually transmitted disease) 09/26/2016  . 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  . Nasopharyngitis, 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/2016  . Postnasal drip 05/18/2016  . S/P total knee replacement 02/13/2016  . Chronic pain of both knees 12/06/2015  . Globus pharyngeus 05/17/2015  . Productive cough 05/17/2015  . Genetic testing 12/23/2014  . Family history of breast cancer   . Family history of colon cancer   . Family history of pancreatic cancer   . Breast cancer of upper-outer quadrant of right female breast ([place]) 11/23/2014  . Allergy to ACE inhibitors 09/27/2014  . Encounter for Medicare annual wellness exam 06/27/2014  . Hyperglycemia 06/23/2013  . Routine general medical examination at a health care facility 06/23/2013  . RLS (restless legs syndrome) 01/15/2013  . Hx of Clostridium difficile infection 10/10/2012  . Bronchiectasis without acute exacerbation ([place]) 04/29/2012    . Dyspnea on exertion 04/02/2012  . Non-ischemic cardiomyopathy ([place]) 03/07/2012  . Colon cancer screening 01/11/2012  . Post-menopausal 01/11/2012  . Chest pain 11/07/2011  . Palpitations 04/17/2011  . Abnormal tympanic membrane 01/22/2011  . Obstructive sleep apnea 04/04/2010  . VENTRICULAR TACHYCARDIA 01/03/2009  . ICD  [employer]  Single chamber 01/03/2009  . PATENT FORAMEN OVALE 09/29/2008  . Vitamin D deficiency 08/17/2008  . Hypothyroidism 11/18/2006  . Hyperlipidemia 11/18/2006  . Depressed mood 11/18/2006  . Essential hypertension 11/18/2006  . MITRAL VALVE PROLAPSE 11/18/2006  . CEREBRAL ANEURYSM 11/18/2006  . Allergic rhinitis 11/18/2006  . GERD 11/18/2006  . DIVERTICULOSIS, COLON 11/18/2006  . Fatty liver 11/18/2006  . Fibromyalgia 11/18/2006   Past Medical History:  Diagnosis Date  . AICD (automatic cardioverter/defibrillator) present   . Anxiety   . Arthritis   . 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 Cerebral aneurysm, nonruptured    had a clip put in  . Clostridium difficile infection   . Depressive disorder, not elsewhere classified   . Diverticulosis of colon (without mention of hemorrhage)   . Esophageal reflux   . Fibromyalgia   . Gastritis   . GERD (gastroesophageal reflux disease)   . GI bleed 2004  . Hiatal hernia   . Hyperlipidemia   . Hypertension   . Hypothyroidism   . Internal hemorrhoids   . Obstructive sleep apnea (adult) (pediatric)   . Ostium  secundum type atrial septal defect   . Other chronic nonalcoholic liver disease   . Other pulmonary embolism and infarction   . Paroxysmal ventricular tachycardia ([place])   . Personal history of radiation therapy   . Pneumonia    history  . Presence of permanent cardiac pacemaker   . PUD (peptic ulcer disease)   . Radiation 02/03/15-03/10/15   Right Breast  . Sarcoid    per pt , not sure  . Schatzki's ring   . Stroke [facility]) 2013   tia/ pt feels it was  around 2008 0r 2009  . Takotsubo syndrome   . Tubular adenoma of colon   . Unspecified transient cerebral ischemia   . Unspecified vitamin D deficiency    Past Surgical History:  Procedure Laterality Date  . ABDOMINAL HYSTERECTOMY    . ABI  2006   normal  . BREAST EXCISIONAL BIOPSY    . BREAST LUMPECTOMY     right breast 2016  . BREAST LUMPECTOMY WITH RADIOACTIVE SEED LOCALIZATION Right 01/03/2015   Procedure: BREAST LUMPECTOMY WITH RADIOACTIVE SEED LOCALIZATION;  Surgeon: Autumn Messing III, MD;  Location: [place];  Service: General;  Laterality: Right;  . CARDIAC DEFIBRILLATOR PLACEMENT  2006; 2012   BSX single chamber ICD  . carotid dopplers  2006   neg  . CEREBRAL ANEURYSM REPAIR  02/1999  . COLONOSCOPY    . hospitalization  2004   GI bleed, PUD, diverticulosis (EGD,colonscopy)  . hospitalization     PE, NSVT, s/p defib  . JOINT REPLACEMENT  2017   left knee   . KNEE ARTHROSCOPY     bilateral  . LEFT HEART CATHETERIZATION WITH CORONARY ANGIOGRAM N/A 02/22/2012   Procedure: LEFT HEART CATHETERIZATION WITH CORONARY ANGIOGRAM;  Surgeon: Burnell Blanks, MD;  Location: [facility] CATH LAB;  Service: Cardiovascular;  Laterality: N/A;  . PARTIAL HYSTERECTOMY     Fibroids  . TONGUE BIOPSY  12/12/2017   due to sore tongue and white patches/abnormal cells  . TOTAL KNEE ARTHROPLASTY Left 02/13/2016   Procedure: TOTAL KNEE ARTHROPLASTY;  Surgeon: Vickey Huger, MD;  Location: [place];  Service: Orthopedics;  Laterality: Left;  . UPPER GASTROINTESTINAL ENDOSCOPY     Social History   Tobacco Use  . Smoking status: Former Smoker    Packs/day: 1.00    Years: 15.00    Pack years: 15.00    Types: Cigarettes    Last attempt to quit: 08/20/1989    Years since quitting: 28.9  . Smokeless tobacco: Never Used  . Tobacco comment: Started at 9; less than 1 PPD  Substance Use Topics  . Alcohol use: Yes    Alcohol/week: 0.0 standard drinks    Comment: rare  . Drug use: No   Family History  Problem  Relation Age of Onset  . Diabetes Mother   . Alzheimer's disease Mother   . Other Brother        Thyroid problem 10/2016  . Allergies Sister   . Heart disease Sister   . Heart disease Brother   . Prostate cancer Brother 41       same brother as throat cancer  . Throat cancer Brother        dx in his 88s; also a smoker  . Heart disease Father   . Cancer Maternal Grandmother 81       colon cancer or abdominal cancer  . Lung cancer Maternal Grandfather 75  . Breast cancer Maternal Aunt 59  . Colon cancer Maternal  Aunt 56       same sister as breast at 55  . Lung cancer Maternal Uncle   . Breast cancer Maternal Aunt        dx in her 29s  . Pancreatic cancer Cousin 72       maternal first cousin  . Breast cancer Cousin        paternal first cousin twice removed died in her 63s  . Cardiomyopathy Unknown        Family history  . Heart disease Son        Cardiac Arrest 07/2016  . Stroke Neg Hx    Allergies  Allergen Reactions  . Ace Inhibitors Swelling    Angioedema; makes tongue "break out"   . Dilantin [Phenytoin Sodium Extended] Rash    Severe rash  . Atorvastatin Other (See Comments)    SEVERE MYALGIA  . Ramipril Other (See Comments)    TONGUE ULCERS   . Rosuvastatin Other (See Comments)    SEVERE MYALGIA  . Iodine-131 Other (See Comments)    Swelling at IV site only, no swelling or SOB around mouth.!  . Paroxetine Nausea Only    Rapid heartbeat  . Wellbutrin [Bupropion]     Palpitations   . Amitriptyline Hcl Other (See Comments)     makes her too sleepy!  . Carbamazepine Rash  . Codeine Itching  . Phenytoin Sodium Extended Rash   Current Outpatient Medications on File Prior to Visit  Medication Sig Dispense Refill  . acetaminophen (TYLENOL) 500 MG tablet Take 500 mg by mouth every 6 (six) hours as needed (pain).     [name] albuterol (VENTOLIN HFA) 108 (90 Base) MCG/ACT inhaler Inhale 2 puffs into the lungs every 6 (six) hours as needed for wheezing or shortness of  breath. 3 Inhaler 3  . aspirin 81 MG tablet Take 81 mg by mouth daily.    . baclofen (LIORESAL) 10 MG tablet TAKE 1 TABLET THREE TIMES DAILY (Patient taking differently: Take 10 mg by mouth daily. ) 270 tablet 0  . calcium carbonate (OSCAL) 1500 (600 Ca) MG TABS tablet Take 600 mg of elemental calcium by mouth once a week.     . cholecalciferol (VITAMIN D) 1000 UNITS tablet Take 5,000 Units by mouth daily.     [name] escitalopram (LEXAPRO) 10 MG tablet Take 1 tablet (10 mg total) by mouth daily. 30 tablet 11  . fexofenadine (ALLEGRA) 180 MG tablet Take 180 mg by mouth daily as needed for allergies or rhinitis.    . fluticasone (FLONASE) 50 MCG/ACT nasal spray Place 2 sprays into both nostrils daily as needed for allergies or rhinitis. (Patient taking differently: Place 2 sprays into both nostrils 2 (two) times daily. ) 48 g 3  . hydrochlorothiazide (HYDRODIURIL) 12.5 MG tablet Take 1 tablet (12.5 mg total) by mouth daily. 90 tablet 3  . hydroxypropyl methylcellulose / hypromellose (ISOPTO TEARS / GONIOVISC) 2.5 % ophthalmic solution Place 1 drop into both eyes daily.    [name] latanoprost (XALATAN) 0.005 % ophthalmic solution Place 1 drop into both eyes at bedtime.    [name] levothyroxine (SYNTHROID, LEVOTHROID) 50 MCG tablet Take 1 tablet (50 mcg total) by mouth daily. (Patient taking differently: Take 50 mcg by mouth daily before breakfast. ) 90 tablet 3  . mometasone (ELOCON) 0.1 % lotion Apply 1 application topically daily as needed (rash).     . montelukast (SINGULAIR) 10 MG tablet Take 1 tablet (10 mg total) by mouth at bedtime. [place]  tablet 3  . Multiple Vitamins-Minerals (MULTIVITAMIN PO) Take 1 tablet by mouth daily.    . NON FORMULARY HEMP/CBD OIL -takes a few drops or a pill prn    . pantoprazole (PROTONIX) 40 MG tablet Take 1 tablet (40 mg total) by mouth 2 (two) times daily before a meal. (Patient taking differently: Take 40 mg by mouth daily. ) 180 tablet 2  . polyethylene glycol powder  (GLYCOLAX/MIRALAX) powder Take 17 grams by mouth daily (Patient taking differently: Take 1 Container by mouth daily as needed for moderate constipation. Take 17 grams by mouth PRN) 1080 g 0  . potassium chloride (K-DUR) 10 MEQ tablet TAKE 1/2 TABLET (5MEQ TOTAL) EVERY DAY (Patient taking differently: Take 5 mEq by mouth daily. TAKE 1/2 TABLET (5MEQ TOTAL) EVERY DAY) 45 tablet 3  . Probiotic Product (PROBIOTIC-10 PO) Take 1 capsule by mouth daily.     [name] Respiratory Therapy Supplies (FLUTTER) DEVI 1 application by Does not apply route 2 (two) times daily before lunch and supper. 1 each 0  . simvastatin (ZOCOR) 40 MG tablet TAKE 1/2 TABLET  (20  MG  TOTAL) EVERY DAY (Patient taking differently: Take 20 mg by mouth daily. TAKE 1/2 TABLET  (20  MG  TOTAL) EVERY DAY) 45 tablet 2  . traMADol (ULTRAM) 50 MG tablet Take 1 tablet (50 mg total) by mouth at bedtime. 30 tablet 1  . zolpidem (AMBIEN) 10 MG tablet TAKE 1 TABLET BY MOUTH AT BEDTIME AS NEEDED FOR SLEEP 90 tablet 1  . carvedilol (COREG) 12.5 MG tablet Take 0.5 tablets (6.25 mg total) by mouth 2 (two) times daily. 90 tablet 3   No current facility-administered medications on file prior to visit.     Review of Systems  Constitutional: Positive for appetite change and fatigue. Negative for fever.  HENT: Positive for congestion, postnasal drip, rhinorrhea, sinus pressure, sneezing and sore throat. Negative for ear pain.   Eyes: Negative for pain and discharge.  Respiratory: Positive for cough. Negative for shortness of breath, wheezing and stridor.   Cardiovascular: Negative for chest pain.  Gastrointestinal: Negative for diarrhea, nausea and vomiting.  Genitourinary: Negative for frequency, hematuria and urgency.  Musculoskeletal: Negative for arthralgias and myalgias.  Skin: Negative for rash.  Neurological: Positive for headaches. Negative for dizziness, weakness and light-headedness.  Psychiatric/Behavioral: Negative for confusion and dysphoric  mood.       Objective:   Physical Exam  Constitutional: She appears well-developed and well-nourished. No distress.  obese and well appearing   HENT:  Head: Normocephalic and atraumatic.  Right Ear: External ear normal.  Left Ear: External ear normal.  Mouth/Throat: Oropharynx is clear and moist.  Nares are injected and congested  No sinus tenderness Clear rhinorrhea and post nasal drip   Eyes: Pupils are equal, round, and reactive to light. Conjunctivae and EOM are normal. Right eye exhibits no discharge. Left eye exhibits no discharge.  Neck: Normal range of motion. Neck supple.  Cardiovascular: Normal rate and normal heart sounds.  Pulmonary/Chest: Effort normal and breath sounds normal. No stridor. No respiratory distress. She has no wheezes. She has no rales. She exhibits no tenderness.  Good air exch Harsh bs  No wheeze even on forced exp No rales or rhonchi  Lymphadenopathy:    She has no cervical adenopathy.  Neurological: She is alert.  Skin: Skin is warm and dry. No rash noted.  Psychiatric: She has a normal mood and affect.  Assessment & Plan:   Problem List Items Addressed This Visit      Respiratory   Viral URI with cough - Primary    Re assuring exam In setting of h/o bronchiectasis  Symptom care-expectorant/delsym, tessalon and fluids/rest Px doxycycline to have if needed for worsening symptoms or no improvement in a week  Update if not starting to improve in a week or if worsening  (esp wheeze or fever or sob)  Meds ordered this encounter  Medications  . benzonatate (TESSALON) 200 MG capsule    Sig: Take 1 capsule (200 mg total) by mouth 3 (three) times daily as needed. Do not bite pill    Dispense:  30 capsule    Refill:  1  . doxycycline (VIBRA-TABS) 100 MG tablet    Sig: Take 1 tablet (100 mg total) by mouth 2 (two) times daily.    Dispense:  14 tablet    Refill:  0

## 2018-07-11 NOTE — Patient Instructions (Addendum)
I think you have a head and chest cold    DM (delsym has it and probably the multi symptom mucinex has it as well)  Try the tessalon pills also for cough relief   Continue expectorant (mucinex)- guaifenesin is the ingredient  Lots of fluids also and rest   Hold the doxycycline (antibiotic)-take if worse or not improving in the next week  Update if not starting to improve in a week or if worsening

## 2018-07-13 NOTE — Assessment & Plan Note (Signed)
Re assuring exam In setting of h/o bronchiectasis  Symptom care-expectorant/delsym, tessalon and fluids/rest Px doxycycline to have if needed for worsening symptoms or no improvement in a week  Update if not starting to improve in a week or if worsening  (esp wheeze or fever or sob)  Meds ordered this encounter  Medications  . benzonatate (TESSALON) 200 MG capsule    Sig: Take 1 capsule (200 mg total) by mouth 3 (three) times daily as needed. Do not bite pill    Dispense:  30 capsule    Refill:  1  . doxycycline (VIBRA-TABS) 100 MG tablet    Sig: Take 1 tablet (100 mg total) by mouth 2 (two) times daily.    Dispense:  14 tablet    Refill:  0

## 2018-08-07 LAB — CUP PACEART REMOTE DEVICE CHECK
Implantable Lead Location: 753860
Implantable Pulse Generator Implant Date: 20110505
MDC IDC LEAD IMPLANT DT: 20050603
MDC IDC LEAD SERIAL: 116340
MDC IDC SESS DTM: 20191219112943
Pulse Gen Serial Number: 266301

## 2018-08-26 ENCOUNTER — Telehealth: Payer: Self-pay | Admitting: Internal Medicine

## 2018-08-26 NOTE — Telephone Encounter (Signed)
Pt has c/o heart palpitations at night with some slight chest discomfort. I advised pt to send in a remote transmission so we might take a look to see if she is having episodes of VT.

## 2018-08-26 NOTE — Telephone Encounter (Signed)
New Message   Patient c/o Palpitations:  High priority if patient c/o lightheadedness, shortness of breath, or chest pain  1) How long have you had palpitations/irregular HR/ Afib? Are you having the symptoms now? About 1 week  2) Are you currently experiencing lightheadedness, SOB or CP? Having a little discomfort in her chest at night when she lays down  3) Do you have a history of afib (atrial fibrillation) or irregular heart rhythm? Doesn't think so only tachycardia   4) Have you checked your BP or HR? (document readings if available):   5) Are you experiencing any other symptoms? no

## 2018-08-26 NOTE — Telephone Encounter (Signed)
Presenting Rhythm: SR  75, RV lead only No NSVT episodes since 08/08/2018 VT-Monitor zone set at 150bpm so any HR< 150 will not record.

## 2018-08-26 NOTE — Telephone Encounter (Signed)
Called pt back to review her remote transmission report. Pt confirms she has been compliant with her medications. Recently she has taken an extra dose of carvedilol at night which helped to relieve her palpitations. I advised pt if she begins taking extra coreg at night regularly, to call the office to discuss medication changes.   Pt has agreed to come into the office to discuss these symptoms and for an ICD check on 1/31. In the meantime, pt understands if she has prolonged chest, neck, shoulder, and/or back pain she should call 911. Furthermore, if she notices sustained heart palpitations or if she becomes symptomatic with her palpitations, she should go to the ED.  Pt had no additional questions or concerns at this time.

## 2018-09-03 ENCOUNTER — Ambulatory Visit (INDEPENDENT_AMBULATORY_CARE_PROVIDER_SITE_OTHER): Payer: Medicare Other

## 2018-09-03 DIAGNOSIS — I472 Ventricular tachycardia, unspecified: Secondary | ICD-10-CM

## 2018-09-04 NOTE — Progress Notes (Signed)
Remote ICD transmission.   

## 2018-09-07 LAB — CUP PACEART REMOTE DEVICE CHECK
Date Time Interrogation Session: 20200115080100
HIGH POWER IMPEDANCE MEASURED VALUE: 68 Ohm
Implantable Lead Location: 753860
Implantable Lead Serial Number: 116340
Implantable Pulse Generator Implant Date: 20110505
Lead Channel Pacing Threshold Amplitude: 0.7 V
Lead Channel Pacing Threshold Pulse Width: 0.4 ms
Lead Channel Setting Pacing Pulse Width: 0.4 ms
MDC IDC LEAD IMPLANT DT: 20050603
MDC IDC MSMT BATTERY REMAINING LONGEVITY: 54 mo
MDC IDC MSMT BATTERY REMAINING PERCENTAGE: 61 %
MDC IDC MSMT LEADCHNL RV IMPEDANCE VALUE: 640 Ohm
MDC IDC SET LEADCHNL RV PACING AMPLITUDE: 2.4 V
MDC IDC SET LEADCHNL RV SENSING SENSITIVITY: 0.4 mV
MDC IDC STAT BRADY RV PERCENT PACED: 0 %
Pulse Gen Serial Number: 266301

## 2018-09-19 ENCOUNTER — Ambulatory Visit
Admission: RE | Admit: 2018-09-19 | Discharge: 2018-09-19 | Disposition: A | Discharge status: Home / Self Care | Payer: Medicare Other | Source: Ambulatory Visit | Attending: Internal Medicine | Admitting: Internal Medicine

## 2018-09-19 ENCOUNTER — Ambulatory Visit (INDEPENDENT_AMBULATORY_CARE_PROVIDER_SITE_OTHER): Payer: Medicare Other | Admitting: Internal Medicine

## 2018-09-19 ENCOUNTER — Encounter: Payer: Self-pay | Admitting: Internal Medicine

## 2018-09-19 VITALS — BP 120/70 | HR 64 | Ht 66.5 in | Wt 224.2 lb

## 2018-09-19 DIAGNOSIS — I5042 Chronic combined systolic (congestive) and diastolic (congestive) heart failure: Secondary | ICD-10-CM | POA: Diagnosis not present

## 2018-09-19 DIAGNOSIS — I428 Other cardiomyopathies: Secondary | ICD-10-CM

## 2018-09-19 DIAGNOSIS — I472 Ventricular tachycardia, unspecified: Secondary | ICD-10-CM

## 2018-09-19 DIAGNOSIS — Z95810 Presence of automatic (implantable) cardiac defibrillator: Secondary | ICD-10-CM

## 2018-09-19 MED ORDER — CARVEDILOL 12.5 MG PO TABS: 12.5000 mg | ORAL_TABLET | Freq: Two times a day (BID) | ORAL | 3 refills | Status: DC

## 2018-09-19 MED ORDER — FUROSEMIDE 20 MG PO TABS: 20.0000 mg | ORAL_TABLET | Freq: Every day | ORAL | 3 refills | Status: DC

## 2018-09-19 NOTE — Progress Notes (Signed)
@Patient  ID: [name], female    DOB: 1950/10/21, 68 y.o.   MRN: [id number]  Chief Complaint  Patient presents with  . Acute Visit    Increased SOB for the past few months. Chest congestion but unable to cough up anything. Appt made by Dr. Caryl Comes (her cardiologist).  Denies any fever/chills    Referring provider: Tower, [name], MD  HPI: 68 year old female, former smoker quit in [place] (15 pack year hx). PMH bronchiectasis, OSA, HTN. Patient of Dr. Halford Chessman, last seen on 02/05/18. Maintained on CPAP 7cm H20.  History of right breast cancer with radiation - around 2016  Tests: PSG 11/29/08 >> AHI 9 HST 11/10/15 >> AHI 7.1, SaO2 low 75% Auto CPAP5/19/19 to 02/03/18 >> used on 29 of 30 nights with average 4 hrs 32 min.  Average AHI 5.2 with CPAP 7 cm H2O. Pulmonary tests CT chest 04/10/12 >> b/l lower lobe cylindrical BTX and some in upper lobes PFT 04/17/12 >> FEV1 2.60 (110%), FEV1% 85, TLC 4.05 (77%), DLCO 44% CT angio chest 10/26/15 >> patchy GGO in periphery of lungs b/l, basilar BTX, no PE; no significant change compared to 2013 PFT 01/17/17 >> FEV1 2.38 (111%), FEV1% 90, TLC 3.85 (71%), DLCO 58%, no BD  Other imaging CT abdomen pelvis - Suspect interstitial lung disease with bronchiectasis at the lung bases. A high-resolution chest CT for interstitial lung disease may be helpful for further evaluation, particularly if the patient is symptomatic.  Cardiac tests Echo9/24/18 >> mild LVH, EF 55 to 60%, grade 1 DD, mild TR  OV 09/23/18 - acute cough, shortness of breath, chest congestion Patient presents today with acute cough, shortness of breath, chest congestion.  She states symptoms started a couple weeks ago.  She states that she has been having ongoing shortness of breath for about a year now.  For the past couple weeks this is increased.  States that her cough is nonproductive.  States that she has been trying to use her flutter valve but is old and she thinks it is broken.  She is  requesting a new flutter valve device today.  Patient has a history of OSA and also states today that she has been having issues with her facemask.  States that there is excessive moisture in it and she has to take it off during the night.  She denies any recent fever.  She denies any chest pain or edema.  Allergies  Allergen Reactions  . Ace Inhibitors Swelling    Angioedema; makes tongue "break out"   . Dilantin [Phenytoin Sodium Extended] Rash    Severe rash  . Atorvastatin Other (See Comments)    SEVERE MYALGIA  . Ramipril Other (See Comments)    TONGUE ULCERS   . Rosuvastatin Other (See Comments)    SEVERE MYALGIA  . Iodine-131 Other (See Comments)    Swelling at IV site only, no swelling or SOB around mouth.!  . Paroxetine Nausea Only    Rapid heartbeat  . Wellbutrin [Bupropion]     Palpitations   . Amitriptyline Hcl Other (See Comments)     makes her too sleepy!  . Carbamazepine Rash  . Codeine Itching  . Phenytoin Sodium Extended Rash    Immunization History  Administered Date(s) Administered  . H1N1 07/24/2008  . Influenza Split 07/17/2011, 09/27/2011, 04/29/2012  . Influenza Whole 07/20/2004, 07/04/2007, 05/07/2008, 05/19/2009, 05/17/2010  . Influenza, High Dose Seasonal PF 05/30/2017, 05/21/2018  . Influenza,inj,Quad PF,6+ Mos 05/20/2013, 05/06/2014, 05/17/2015, 05/15/2016  .  Influenza-Unspecified 05/07/2018  . Pneumococcal Conjugate-13 11/22/2015  . Pneumococcal Polysaccharide-23 05/20/2002, 05/07/2008, 06/25/2014  . Td 09/07/2009  . Zoster 09/21/2011    Past Medical History:  Diagnosis Date  . AICD (automatic cardioverter/defibrillator) present   . Anxiety   . Arthritis   . 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 Cerebral aneurysm, nonruptured    had a clip put in  . Clostridium difficile infection   . Depressive disorder, not elsewhere classified   . Diverticulosis of colon (without mention of hemorrhage)   . Esophageal  reflux   . Fibromyalgia   . Gastritis   . GERD (gastroesophageal reflux disease)   . GI bleed 2004  . Hiatal hernia   . Hyperlipidemia   . Hypertension   . Hypothyroidism   . Internal hemorrhoids   . Obstructive sleep apnea (adult) (pediatric)   . Ostium secundum type atrial septal defect   . Other chronic nonalcoholic liver disease   . Other pulmonary embolism and infarction   . Paroxysmal ventricular tachycardia ([place])   . Personal history of radiation therapy   . Pneumonia    history  . Presence of permanent cardiac pacemaker   . PUD (peptic ulcer disease)   . Radiation 02/03/15-03/10/15   Right Breast  . Sarcoid    per pt , not sure  . Schatzki's ring   . Stroke [facility]) 2013   tia/ pt feels it was around 2008 0r 2009  . Takotsubo syndrome   . Tubular adenoma of colon   . Unspecified transient cerebral ischemia   . Unspecified vitamin D deficiency     Tobacco History: Social History   Tobacco Use  Smoking Status Former Smoker  . Packs/day: 1.00  . Years: 15.00  . Pack years: 15.00  . Types: Cigarettes  . Last attempt to quit: 08/20/1989  . Years since quitting: 29.1  Smokeless Tobacco Never Used  Tobacco Comment   Started at 4; less than 1 PPD   Counseling given: Not Answered Comment: Started at 80; less than 1 PPD   Outpatient Medications Prior to Visit  Medication Sig Dispense Refill  . acetaminophen (TYLENOL) 500 MG tablet Take 500 mg by mouth every 6 (six) hours as needed (pain).     [name] albuterol (VENTOLIN HFA) 108 (90 Base) MCG/ACT inhaler Inhale 2 puffs into the lungs every 6 (six) hours as needed for wheezing or shortness of breath. 3 Inhaler 3  . aspirin 81 MG tablet Take 81 mg by mouth daily.    . baclofen (LIORESAL) 10 MG tablet TAKE 1 TABLET THREE TIMES DAILY 270 tablet 0  . benzonatate (TESSALON) 200 MG capsule Take 1 capsule (200 mg total) by mouth 3 (three) times daily as needed. Do not bite pill 30 capsule 1  . calcium carbonate (OSCAL) 1500 (600  Ca) MG TABS tablet Take 600 mg of elemental calcium by mouth once a week.     . carvedilol (COREG) 12.5 MG tablet Take 1 tablet (12.5 mg total) by mouth 2 (two) times daily. 90 tablet 3  . cholecalciferol (VITAMIN D) 1000 UNITS tablet Take 5,000 Units by mouth daily.     [name] escitalopram (LEXAPRO) 10 MG tablet Take 1 tablet (10 mg total) by mouth daily. 30 tablet 11  . fexofenadine (ALLEGRA) 180 MG tablet Take 180 mg by mouth daily as needed for allergies or rhinitis.    . fluticasone (FLONASE) 50 MCG/ACT nasal spray Place 2 sprays into both nostrils  daily as needed for allergies or rhinitis. 48 g 3  . furosemide (LASIX) 20 MG tablet Take 1 tablet (20 mg total) by mouth daily. 90 tablet 3  . hydroxypropyl methylcellulose / hypromellose (ISOPTO TEARS / GONIOVISC) 2.5 % ophthalmic solution Place 1 drop into both eyes daily.    [name] latanoprost (XALATAN) 0.005 % ophthalmic solution Place 1 drop into both eyes at bedtime.    [name] levothyroxine (SYNTHROID, LEVOTHROID) 50 MCG tablet Take 1 tablet (50 mcg total) by mouth daily. 90 tablet 3  . mometasone (ELOCON) 0.1 % lotion Apply 1 application topically daily as needed (rash).     . montelukast (SINGULAIR) 10 MG tablet Take 1 tablet (10 mg total) by mouth at bedtime. 90 tablet 3  . Multiple Vitamins-Minerals (MULTIVITAMIN PO) Take 1 tablet by mouth daily.    . NON FORMULARY HEMP/CBD OIL -takes a few drops or a pill prn    . pantoprazole (PROTONIX) 40 MG tablet Take 1 tablet (40 mg total) by mouth 2 (two) times daily before a meal. 180 tablet 2  . polyethylene glycol powder (GLYCOLAX/MIRALAX) powder Take 17 grams by mouth daily 1080 g 0  . potassium chloride (K-DUR) 10 MEQ tablet TAKE 1/2 TABLET (5MEQ TOTAL) EVERY DAY (Patient taking differently: Take 5 mEq by mouth daily. TAKE 1/2 TABLET (5MEQ TOTAL) EVERY DAY) 45 tablet 3  . Probiotic Product (PROBIOTIC-10 PO) Take 1 capsule by mouth daily.     [name] Respiratory Therapy Supplies (FLUTTER) DEVI 1 application by Does  not apply route 2 (two) times daily before lunch and supper. 1 each 0  . simvastatin (ZOCOR) 40 MG tablet TAKE 1/2 TABLET  (20  MG  TOTAL) EVERY DAY 45 tablet 2  . traMADol (ULTRAM) 50 MG tablet Take 1 tablet (50 mg total) by mouth at bedtime. 30 tablet 1  . zolpidem (AMBIEN) 10 MG tablet TAKE 1 TABLET BY MOUTH AT BEDTIME AS NEEDED FOR SLEEP 90 tablet 1  . doxycycline (VIBRA-TABS) 100 MG tablet Take 1 tablet (100 mg total) by mouth 2 (two) times daily. 14 tablet 0   No facility-administered medications prior to visit.       Review of Systems  Review of Systems  Constitutional: Negative.  Negative for chills and fever.  HENT: Negative.   Respiratory: Positive for cough and shortness of breath. Negative for wheezing.   Cardiovascular: Negative.  Negative for chest pain, palpitations and leg swelling.  Gastrointestinal: Negative.   Allergic/Immunologic: Negative.   Neurological: Negative.   Psychiatric/Behavioral: Negative.      Physical Exam  BP 122/80 (BP Location: Left Arm, Patient Position: Sitting, Cuff Size: Large)   Pulse 68   Ht 5' 6.5" (1.689 m)   Wt 225 lb (102.1 kg)   LMP  (LMP Unknown)   SpO2 98%   BMI 35.77 kg/m  Physical Exam Vitals signs and nursing note reviewed.  Constitutional:      General: She is not in acute distress.    Appearance: She is well-developed.  Cardiovascular:     Rate and Rhythm: Normal rate and regular rhythm.  Pulmonary:     Effort: Pulmonary effort is normal. No respiratory distress.     Breath sounds: Normal breath sounds. No wheezing or rhonchi.  Neurological:     Mental Status: She is alert and oriented to person, place, and time.      Imaging: Dg Chest 2 View  Result Date: 09/19/2018 CLINICAL DATA:  Shortness of breath, dry cough EXAM: CHEST - 2  VIEW COMPARISON:  11/20/2016 FINDINGS: Left AICD remains in place, unchanged. Cardiomegaly. No confluent airspace opacities, effusions or edema. No acute bony abnormality. IMPRESSION:  Cardiomegaly.  No active disease. Electronically Signed   By: Rolm Baptise M.D.   On: 09/19/2018 18:23     Assessment & Plan:   Bronchiectasis with acute exacerbation ([place]) Upon reviewing last CT scan - CT abdomen - I was concerned for impression of suspected interstitial lung disease with bronchiectasis at the lung bases. It was recommended that a high-resolution chest CT for interstitial lung disease may be helpful for further evaluation, particularly if the patient is symptomatic. Will order HRCT since patient is more short of breath. Will order PFT. Patient also needs new flutter valve. Will treat today for bronchiectasis with exacerbation.   Patient Instructions  Will order HRCT  - follow up from CT of abdomen this past June - please schedule before follow up with Dr. Halford Chessman Will order PFT New flutter valve given to patient in office today - use three times daily Will have DME company check CPAP - excessive moisture in mask Will order albuterol nebulizer Will order doxycycline May use mucinex twice daily Continue all other medications   Follow up: Please follow up with Dr. Halford Chessman in around 1-2 months with PFT before follow up appointment on same day.       Fenton Foy, NP 09/23/2018

## 2018-09-19 NOTE — Patient Instructions (Addendum)
Medication Instructions:  Your physician has recommended you make the following change in your medication:   1. Increase your Coreg to 12.5mg  tablet, twice per day 2. Begin Lasix, 20mg  tablet, once daily for the next three days. Then take 20mg  tablet every other day.  3. Discontinue Hydrochlorothiazide (HCTZ)  Labwork: None ordered.  Testing/Procedures: Your physician recommends you have a chest x ray performed.  Follow-Up: Your physician recommends that you schedule a follow-up appointment in:   ASAP with Dr Halford Chessman or his PA  6 months with Dr Caryl Comes  Any Other Special Instructions Will Be Listed Below (If Applicable).     If you need a refill on your cardiac medications before your next appointment, please call your pharmacy.

## 2018-09-19 NOTE — Progress Notes (Signed)
Patient Care Team: Tower, [name], MD as PCP - [place], [place], MD as Consulting Physician (General Surgery) Truitt Merle, MD as Consulting Physician (Hematology) Thea Silversmith, MD as Consulting Physician (Radiation Oncology) Rockwell Germany, RN as Registered Nurse Mauro Kaufmann, RN as Registered Nurse Deboraha Sprang, MD as Consulting Physician (Cardiology) Holley Bouche, NP (Inactive) as Nurse Practitioner (Nurse Practitioner) Sylvan Cheese, NP as Nurse Practitioner (Nurse Practitioner) Chesley Mires, MD as Consulting Physician (Pulmonary Disease) Jovita Kussmaul, MD as Consulting Physician (General Surgery) Bo Merino, MD as Consulting Physician (Rheumatology)   HPI  MARQUITTA [name] is a 68 y.o. 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  This correlates with peripheral edema.  She has had nocturnal dyspnea or orthopnea.  She has sleep apnea but her CPAP is not well tolerated because of a dry mouth/dry nose and she finds it on the floor in the morning.  Erasmo [place])  She has been seen by pulmonary.  She carries a diagnosis of bronchiectasis.   Her diet is salt replete  She is also been having significant increase in palpitations.  She adjusted her carvedilol spontaneously from 6.25--12.5 with significant improvement give me about 30 minutes of is okay  Past Medical History:  Diagnosis Date  . AICD (automatic cardioverter/defibrillator) present   . Anxiety   . Arthritis   . 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 Cerebral aneurysm, nonruptured    had a clip put  in  . Clostridium difficile infection   . Depressive disorder, not elsewhere classified   . Diverticulosis of colon (without mention of hemorrhage)   . Esophageal reflux   . Fibromyalgia   . Gastritis   . GERD (gastroesophageal reflux disease)   . GI bleed 2004  . Hiatal hernia   . Hyperlipidemia   . Hypertension   . Hypothyroidism   . Internal hemorrhoids   . Obstructive sleep apnea (adult) (pediatric)   . Ostium secundum type atrial septal defect   . Other chronic nonalcoholic liver disease   . Other pulmonary embolism and infarction   . Paroxysmal ventricular tachycardia ([place])   . Personal history of radiation therapy   . Pneumonia    history  . Presence of permanent cardiac pacemaker   . PUD (peptic ulcer disease)   . Radiation 02/03/15-03/10/15   Right Breast  . Sarcoid    per pt , not sure  . Schatzki's ring   . Stroke [facility]) 2013   tia/ pt feels it was around 2008 0r 2009  . Takotsubo syndrome   . Tubular adenoma of colon   . Unspecified transient cerebral ischemia   . Unspecified vitamin D deficiency     Past Surgical History:  Procedure Laterality Date  . ABDOMINAL HYSTERECTOMY    . ABI  2006   normal  . BREAST EXCISIONAL BIOPSY    . BREAST LUMPECTOMY  right breast 2016  . BREAST LUMPECTOMY WITH RADIOACTIVE SEED LOCALIZATION Right 01/03/2015   Procedure: BREAST LUMPECTOMY WITH RADIOACTIVE SEED LOCALIZATION;  Surgeon: Autumn Messing III, MD;  Location: [facility];  Service: General;  Laterality: Right;  . CARDIAC DEFIBRILLATOR PLACEMENT  2006; 2012   BSX single chamber ICD  . carotid dopplers  2006   neg  . CEREBRAL ANEURYSM REPAIR  02/1999  . COLONOSCOPY    . hospitalization  2004   GI bleed, PUD, diverticulosis (EGD,colonscopy)  . hospitalization     PE, NSVT, s/p defib  . JOINT REPLACEMENT  2017   left knee   . KNEE ARTHROSCOPY     bilateral  . LEFT HEART CATHETERIZATION WITH CORONARY ANGIOGRAM N/A 02/22/2012   Procedure: LEFT HEART CATHETERIZATION WITH  CORONARY ANGIOGRAM;  Surgeon: Burnell Blanks, MD;  Location: [facility] CATH LAB;  Service: Cardiovascular;  Laterality: N/A;  . PARTIAL HYSTERECTOMY     Fibroids  . TONGUE BIOPSY  12/12/2017   due to sore tongue and white patches/abnormal cells  . TOTAL KNEE ARTHROPLASTY Left 02/13/2016   Procedure: TOTAL KNEE ARTHROPLASTY;  Surgeon: Vickey Huger, MD;  Location: [facility];  Service: Orthopedics;  Laterality: Left;  . UPPER GASTROINTESTINAL ENDOSCOPY      Current Outpatient Medications  Medication Sig Dispense Refill  . acetaminophen (TYLENOL) 500 MG tablet Take 500 mg by mouth every 6 (six) hours as needed (pain).     [name] albuterol (VENTOLIN HFA) 108 (90 Base) MCG/ACT inhaler Inhale 2 puffs into the lungs every 6 (six) hours as needed for wheezing or shortness of breath. 3 Inhaler 3  . aspirin 81 MG tablet Take 81 mg by mouth daily.    . baclofen (LIORESAL) 10 MG tablet TAKE 1 TABLET THREE TIMES DAILY 270 tablet 0  . benzonatate (TESSALON) 200 MG capsule Take 1 capsule (200 mg total) by mouth 3 (three) times daily as needed. Do not bite pill 30 capsule 1  . calcium carbonate (OSCAL) 1500 (600 Ca) MG TABS tablet Take 600 mg of elemental calcium by mouth once a week.     . cholecalciferol (VITAMIN D) 1000 UNITS tablet Take 5,000 Units by mouth daily.     [name] doxycycline (VIBRA-TABS) 100 MG tablet Take 1 tablet (100 mg total) by mouth 2 (two) times daily. 14 tablet 0  . escitalopram (LEXAPRO) 10 MG tablet Take 1 tablet (10 mg total) by mouth daily. 30 tablet 11  . fexofenadine (ALLEGRA) 180 MG tablet Take 180 mg by mouth daily as needed for allergies or rhinitis.    . fluticasone (FLONASE) 50 MCG/ACT nasal spray Place 2 sprays into both nostrils daily as needed for allergies or rhinitis. 48 g 3  . hydrochlorothiazide (HYDRODIURIL) 12.5 MG tablet Take 1 tablet (12.5 mg total) by mouth daily. 90 tablet 3  . hydroxypropyl methylcellulose / hypromellose (ISOPTO TEARS / GONIOVISC) 2.5 % ophthalmic solution  Place 1 drop into both eyes daily.    [name] latanoprost (XALATAN) 0.005 % ophthalmic solution Place 1 drop into both eyes at bedtime.    [name] levothyroxine (SYNTHROID, LEVOTHROID) 50 MCG tablet Take 1 tablet (50 mcg total) by mouth daily. 90 tablet 3  . mometasone (ELOCON) 0.1 % lotion Apply 1 application topically daily as needed (rash).     . montelukast (SINGULAIR) 10 MG tablet Take 1 tablet (10 mg total) by mouth at bedtime. 90 tablet 3  . Multiple Vitamins-Minerals (MULTIVITAMIN PO) Take 1 tablet by mouth daily.    . NON FORMULARY  HEMP/CBD OIL -takes a few drops or a pill prn    . pantoprazole (PROTONIX) 40 MG tablet Take 1 tablet (40 mg total) by mouth 2 (two) times daily before a meal. 180 tablet 2  . polyethylene glycol powder (GLYCOLAX/MIRALAX) powder Take 17 grams by mouth daily 1080 g 0  . potassium chloride (K-DUR) 10 MEQ tablet TAKE 1/2 TABLET (5MEQ TOTAL) EVERY DAY (Patient taking differently: Take 5 mEq by mouth daily. TAKE 1/2 TABLET (5MEQ TOTAL) EVERY DAY) 45 tablet 3  . Probiotic Product (PROBIOTIC-10 PO) Take 1 capsule by mouth daily.     [name] Respiratory Therapy Supplies (FLUTTER) DEVI 1 application by Does not apply route 2 (two) times daily before lunch and supper. 1 each 0  . simvastatin (ZOCOR) 40 MG tablet TAKE 1/2 TABLET  (20  MG  TOTAL) EVERY DAY 45 tablet 2  . traMADol (ULTRAM) 50 MG tablet Take 1 tablet (50 mg total) by mouth at bedtime. 30 tablet 1  . zolpidem (AMBIEN) 10 MG tablet TAKE 1 TABLET BY MOUTH AT BEDTIME AS NEEDED FOR SLEEP 90 tablet 1  . carvedilol (COREG) 12.5 MG tablet Take 0.5 tablets (6.25 mg total) by mouth 2 (two) times daily. 90 tablet 3   No current facility-administered medications for this visit.     Allergies  Allergen Reactions  . Ace Inhibitors Swelling    Angioedema; makes tongue "break out"   . Dilantin [Phenytoin Sodium Extended] Rash    Severe rash  . Atorvastatin Other (See Comments)    SEVERE MYALGIA  . Ramipril Other (See Comments)     TONGUE ULCERS   . Rosuvastatin Other (See Comments)    SEVERE MYALGIA  . Iodine-131 Other (See Comments)    Swelling at IV site only, no swelling or SOB around mouth.!  . Paroxetine Nausea Only    Rapid heartbeat  . Wellbutrin [Bupropion]     Palpitations   . Amitriptyline Hcl Other (See Comments)     makes her too sleepy!  . Carbamazepine Rash  . Codeine Itching  . 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  The information was reviewed. No changes were made in the programming.     Psychosocial Stress    .infrequent VT ns  Modest CHF we will change her from HCTZ--furosemide 20 mg daily x5 days and q. OD.  We will get a chest x-ray to reevaluate her lung status; will reach out to Dr. Halford Chessman for reevaluation as well as help with her CPAP.  Encouraged her to decrease her sodium  intake We spent more than 50% of our >25 min visit in face to face counseling regarding the above

## 2018-09-22 ENCOUNTER — Encounter: Payer: Self-pay | Admitting: Nurse Practitioner

## 2018-09-22 ENCOUNTER — Ambulatory Visit (INDEPENDENT_AMBULATORY_CARE_PROVIDER_SITE_OTHER): Payer: Medicare Other | Admitting: Nurse Practitioner

## 2018-09-22 VITALS — BP 122/80 | HR 68 | Ht 66.5 in | Wt 225.0 lb

## 2018-09-22 DIAGNOSIS — J471 Bronchiectasis with (acute) exacerbation: Secondary | ICD-10-CM

## 2018-09-22 DIAGNOSIS — J849 Interstitial pulmonary disease, unspecified: Secondary | ICD-10-CM

## 2018-09-22 DIAGNOSIS — Z9989 Dependence on other enabling machines and devices: Secondary | ICD-10-CM | POA: Diagnosis not present

## 2018-09-22 DIAGNOSIS — G4733 Obstructive sleep apnea (adult) (pediatric): Secondary | ICD-10-CM

## 2018-09-22 LAB — CUP PACEART INCLINIC DEVICE CHECK
HIGH POWER IMPEDANCE MEASURED VALUE: 68 Ohm
HighPow Impedance: 38 Ohm
Implantable Lead Location: 753860
Implantable Lead Model: 185
Implantable Lead Serial Number: 116340
Lead Channel Pacing Threshold Amplitude: 0.7 V
Lead Channel Pacing Threshold Pulse Width: 0.4 ms
Lead Channel Setting Pacing Amplitude: 2.4 V
Lead Channel Setting Sensing Sensitivity: 0.4 mV
MDC IDC LEAD IMPLANT DT: 20050603
MDC IDC MSMT LEADCHNL RV IMPEDANCE VALUE: 638 Ohm
MDC IDC MSMT LEADCHNL RV SENSING INTR AMPL: 9.6 mV
MDC IDC PG IMPLANT DT: 20110505
MDC IDC PG SERIAL: 266301
MDC IDC SESS DTM: 20200131050000
MDC IDC SET LEADCHNL RV PACING PULSEWIDTH: 0.4 ms

## 2018-09-22 MED ORDER — ALBUTEROL SULFATE (2.5 MG/3ML) 0.083% IN NEBU: 2.5000 mg | INHALATION_SOLUTION | Freq: Four times a day (QID) | RESPIRATORY_TRACT | 12 refills | Status: DC | PRN

## 2018-09-22 MED ORDER — FLUTTER DEVI: 1.0000 | 0 refills | Status: AC | PRN

## 2018-09-22 MED ORDER — DOXYCYCLINE HYCLATE 100 MG PO TABS: 100.0000 mg | ORAL_TABLET | Freq: Two times a day (BID) | ORAL | 0 refills | Status: DC

## 2018-09-22 NOTE — Assessment & Plan Note (Addendum)
Upon reviewing last CT scan - CT abdomen - I was concerned for impression of suspected interstitial lung disease with bronchiectasis at the lung bases. It was recommended that a high-resolution chest CT for interstitial lung disease may be helpful for further evaluation, particularly if the patient is symptomatic. Will order HRCT since patient is more short of breath. Will order PFT. Patient also needs new flutter valve. Will treat today for bronchiectasis with exacerbation.   Patient Instructions  Will order HRCT  - follow up from CT of abdomen this past June - please schedule before follow up with Dr. Halford Chessman Will order PFT New flutter valve given to patient in office today - use three times daily Will have DME company check CPAP - excessive moisture in mask Will order albuterol nebulizer Will order doxycycline May use mucinex twice daily Continue all other medications   Follow up: Please follow up with Dr. Halford Chessman in around 1-2 months with PFT before follow up appointment on same day.

## 2018-09-22 NOTE — Patient Instructions (Signed)
Will order HRCT  - follow up from CT of abdomen this past June - please schedule before follow up with Dr. Halford Chessman Will order PFT New flutter valve given to patient in office today - use three times daily Will have DME company check CPAP - excessive moisture in mask Will order albuterol nebulizer Will order doxycycline May use mucinex twice daily Continue all other medications   Follow up: Please follow up with Dr. Halford Chessman in around 1-2 months with PFT before follow up appointment on same day.

## 2018-09-23 ENCOUNTER — Encounter: Payer: Self-pay | Admitting: Nurse Practitioner

## 2018-09-23 NOTE — Progress Notes (Signed)
Reviewed and agree with assessment/plan.   Saraann Enneking, MD [place] [facility] 08/15/2016, 12:24 PM Pager:  [phone]  

## 2018-10-08 ENCOUNTER — Ambulatory Visit (INDEPENDENT_AMBULATORY_CARE_PROVIDER_SITE_OTHER)
Admission: RE | Admit: 2018-10-08 | Discharge: 2018-10-08 | Disposition: A | Discharge status: Home / Self Care | Payer: Medicare Other | Source: Ambulatory Visit | Attending: Nurse Practitioner | Admitting: Nurse Practitioner

## 2018-10-08 DIAGNOSIS — J849 Interstitial pulmonary disease, unspecified: Secondary | ICD-10-CM

## 2018-10-16 ENCOUNTER — Ambulatory Visit (INDEPENDENT_AMBULATORY_CARE_PROVIDER_SITE_OTHER): Payer: Medicare Other | Admitting: Nurse Practitioner

## 2018-10-16 ENCOUNTER — Ambulatory Visit: Payer: Medicare Other

## 2018-10-16 ENCOUNTER — Encounter: Payer: Self-pay | Admitting: Nurse Practitioner

## 2018-10-16 VITALS — BP 100/70 | HR 65 | Ht 66.25 in | Wt 220.0 lb

## 2018-10-16 DIAGNOSIS — J471 Bronchiectasis with (acute) exacerbation: Secondary | ICD-10-CM

## 2018-10-16 DIAGNOSIS — R918 Other nonspecific abnormal finding of lung field: Secondary | ICD-10-CM

## 2018-10-16 LAB — PULMONARY FUNCTION TEST
DL/VA % pred: 85 %
DL/VA: 3.48 ml/min/mmHg/L
DLCO unc % pred: 59 %
DLCO unc: 12.43 ml/min/mmHg
FEF 25-75 PRE: 3.78 L/s
FEF 25-75 Post: 3.7 L/sec
FEF2575-%CHANGE-POST: -1 %
FEF2575-%PRED-POST: 199 %
FEF2575-%Pred-Pre: 203 %
FEV1-%Change-Post: 0 %
FEV1-%PRED-POST: 110 %
FEV1-%PRED-PRE: 111 %
FEV1-POST: 2.26 L
FEV1-PRE: 2.27 L
FEV1FVC-%CHANGE-POST: 1 %
FEV1FVC-%Pred-Pre: 116 %
FEV6-%Change-Post: -1 %
FEV6-%Pred-Post: 98 %
FEV6-%Pred-Pre: 99 %
FEV6-POST: 2.49 L
FEV6-Pre: 2.52 L
FEV6FVC-%PRED-PRE: 103 %
FEV6FVC-%Pred-Post: 103 %
FVC-%Change-Post: -1 %
FVC-%PRED-POST: 94 %
FVC-%PRED-PRE: 96 %
FVC-POST: 2.49 L
FVC-PRE: 2.52 L
POST FEV1/FVC RATIO: 91 %
PRE FEV6/FVC RATIO: 100 %
Post FEV6/FVC ratio: 100 %
Pre FEV1/FVC ratio: 90 %
RV % PRED: 58 %
RV: 1.35 L
TLC % pred: 74 %
TLC: 4 L

## 2018-10-16 NOTE — Patient Instructions (Addendum)
Will order labs and call with results PFT was stable today  Upper airway cough syndrome with allergic rhinitis. - continue singulair, flonase, nasal irrigation, allegra  Obstructive sleep apnea. - she is compliant with CPAP and reports benefit - continue CPAP 7 cm H2O  Bronchiectasis. - prn mucinex, ventolin - flutter valve 3 times daily  Follow up With Dr. Halford Chessman in 6 months after CT scan or sooner if needed

## 2018-10-16 NOTE — Assessment & Plan Note (Addendum)
Exacerbation has resolved.  Patient presents today for a follow-up with PFT.  Her PFT is stable with slight improvement in DLCO.  Patient did have HRCT which showed bronchiectasis.  Will order labs today -concerned that CT report stated that sarcoid and chronic per sensitivity pneumonitis could not be excluded.  Patient has been stable since last visit.  Encouraged her to use flutter valve device more often.  Will order repeat follow-up HRCT in 6 months with follow-up visit after with Dr. Halford Chessman.  Patient Instructions  Will order labs and call with results PFT was stable today  Upper airway cough syndrome with allergic rhinitis. - continue singulair, flonase, nasal irrigation, allegra  Obstructive sleep apnea. - she is compliant with CPAP and reports benefit - continue CPAP 7 cm H2O  Bronchiectasis. - prn mucinex, ventolin - flutter valve 3 times daily  Follow up With Dr. Halford Chessman in 6 months after CT scan or sooner if needed

## 2018-10-16 NOTE — Progress Notes (Signed)
@Patient  ID: [name], female    DOB: 25-Nov-1950, 68 y.o.   MRN: [id number]  Chief Complaint  Patient presents with  . Follow-up    mild SOB - using neb which helps - mild prod cough (yellow)    Referring provider: Abner Greenspan, MD  HPI 68 year old female, former smoker quit in 1991 (15 pack year hx). PMH bronchiectasis, OSA, HTN. Patient of Dr. Halford Chessman, last seen on 02/05/18. Maintained on CPAP 7cm H20.  History of right breast cancer with radiation - around 2016  Tests: PSG 11/29/08 >> AHI 9 HST 11/10/15 >> AHI 7.1, SaO2 low 75% Auto CPAP5/19/19 to 02/03/18 >>used on 29 of 30 nights with average 4 hrs 32 min. Average AHI 5.2 with CPAP 7 cm H2O. Pulmonary tests CT chest 10/08/18 - Pulmonary parenchymal pattern of bronchiectasis, architectural distortion and slight nodularity, similar to 04/10/2012. Findings may be post infectious/postinflammatory in etiology. Sarcoid is not excluded. Given air trapping, chronic hypersensitivity pneumonitis is also considered. Findings are suggestive of an alternative diagnosis (not UIP) per consensus guidelines: Diagnosis of Idiopathic Pulmonary Fibrosis. Emphysema.  CT chest 04/10/12 >> b/l lower lobe cylindrical BTX and some in upper lobes PFT 04/17/12 >> FEV1 2.60 (110%), FEV1% 85, TLC 4.05 (77%), DLCO 44% CT angio chest 10/26/15 >> patchy GGO in periphery of lungs b/l, basilar BTX, no PE; no significant change compared to 2013 PFT 01/17/17 >> FEV1 2.38 (111%), FEV1% 90, TLC 3.85 (71%), DLCO 58%, no BD  Other imaging CT abdomen pelvis 02/11/18- Suspect interstitial lung disease with bronchiectasis at the lung bases. A high-resolution chest CT for interstitial lung disease may be helpful for further evaluation, particularly if the patient is symptomatic.  Cardiac tests Echo9/24/18 >> mild LVH, EF 55 to 60%, grade 1 DD, mild TR   PFT: PFT Results Latest Ref Rng & Units 10/16/2018 01/17/2017  FVC-Pre L 2.52 2.63  FVC-Predicted Pre % 96 98    FVC-Post L 2.49 2.52  FVC-Predicted Post % 94 94  Pre FEV1/FVC % % 90 90  Post FEV1/FCV % % 91 92  FEV1-Pre L 2.27 2.38  FEV1-Predicted Pre % 111 113  FEV1-Post L 2.26 2.32  DLCO UNC% % 59 58  DLCO COR %Predicted % 85 80  TLC L 4.00 3.85  TLC % Predicted % 74 71  RV % Predicted % 58 55    OV 10/16/18 - Follow up Patient presents today for a follow-up with PFT.  She was last seen by me on 09/22/2018 for bronchiectasis exacerbation.  She was treated with doxycycline.  She states that she has been doing well since last visit.  She denies any significant shortness of breath.  She has been using her albuterol as needed.  She states that her sinuses have been clear.  Her weight has been stable.  She states that she has not been using her flutter valve device.  He does have a slight cough that is nonproductive.  She denies any recent fever.  Denies any chest pain, shortness of breath, or edema.    Allergies  Allergen Reactions  . Ace Inhibitors Swelling    Angioedema; makes tongue "break out"   . Dilantin [Phenytoin Sodium Extended] Rash    Severe rash  . Atorvastatin Other (See Comments)    SEVERE MYALGIA  . Ramipril Other (See Comments)    TONGUE ULCERS   . Rosuvastatin Other (See Comments)    SEVERE MYALGIA  . Iodine-131 Other (See Comments)  Swelling at IV site only, no swelling or SOB around mouth.!  . Paroxetine Nausea Only    Rapid heartbeat  . Wellbutrin [Bupropion]     Palpitations   . Amitriptyline Hcl Other (See Comments)     makes her too sleepy!  . Carbamazepine Rash  . Codeine Itching  . Phenytoin Sodium Extended Rash    Immunization History  Administered Date(s) Administered  . H1N1 07/24/2008  . Influenza Split 07/17/2011, 09/27/2011, 04/29/2012  . Influenza Whole 07/20/2004, 07/04/2007, 05/07/2008, 05/19/2009, 05/17/2010  . Influenza, High Dose Seasonal PF 05/30/2017, 05/21/2018  . Influenza,inj,Quad PF,6+ Mos 05/20/2013, 05/06/2014, 05/17/2015,  05/15/2016  . Influenza-Unspecified 05/07/2018  . Pneumococcal Conjugate-13 11/22/2015  . Pneumococcal Polysaccharide-23 05/20/2002, 05/07/2008, 06/25/2014  . Td 09/07/2009  . Zoster 09/21/2011    Past Medical History:  Diagnosis Date  . AICD (automatic cardioverter/defibrillator) present   . Anxiety   . Arthritis   . 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 Cerebral aneurysm, nonruptured    had a clip put in  . Clostridium difficile infection   . Depressive disorder, not elsewhere classified   . Diverticulosis of colon (without mention of hemorrhage)   . Esophageal reflux   . Fibromyalgia   . Gastritis   . GERD (gastroesophageal reflux disease)   . GI bleed 2004  . Hiatal hernia   . Hyperlipidemia   . Hypertension   . Hypothyroidism   . Internal hemorrhoids   . Obstructive sleep apnea (adult) (pediatric)   . Ostium secundum type atrial septal defect   . Other chronic nonalcoholic liver disease   . Other pulmonary embolism and infarction   . Paroxysmal ventricular tachycardia ([place])   . Personal history of radiation therapy   . Pneumonia    history  . Presence of permanent cardiac pacemaker   . PUD (peptic ulcer disease)   . Radiation 02/03/15-03/10/15   Right Breast  . Sarcoid    per pt , not sure  . Schatzki's ring   . Stroke [facility]) 2013   tia/ pt feels it was around 2008 0r 2009  . Takotsubo syndrome   . Tubular adenoma of colon   . Unspecified transient cerebral ischemia   . Unspecified vitamin D deficiency     Tobacco History: Social History   Tobacco Use  Smoking Status Former Smoker  . Packs/day: 1.00  . Years: 15.00  . Pack years: 15.00  . Types: Cigarettes  . Last attempt to quit: 08/20/1989  . Years since quitting: 29.1  Smokeless Tobacco Never Used  Tobacco Comment   Started at 8; less than 1 PPD   Counseling given: Not Answered Comment: Started at 15; less than 1 PPD   Outpatient Encounter Medications as of  10/16/2018  Medication Sig  . acetaminophen (TYLENOL) 500 MG tablet Take 500 mg by mouth every 6 (six) hours as needed (pain).   [name] albuterol (PROVENTIL) (2.5 MG/3ML) 0.083% nebulizer solution Take 3 mLs (2.5 mg total) by nebulization every 6 (six) hours as needed for wheezing or shortness of breath.  [name] albuterol (VENTOLIN HFA) 108 (90 Base) MCG/ACT inhaler Inhale 2 puffs into the lungs every 6 (six) hours as needed for wheezing or shortness of breath.  [name] aspirin 81 MG tablet Take 81 mg by mouth daily.  . baclofen (LIORESAL) 10 MG tablet TAKE 1 TABLET THREE TIMES DAILY  . calcium carbonate (OSCAL) 1500 (600 Ca) MG TABS tablet Take 600 mg of elemental calcium by mouth  once a week.   . carvedilol (COREG) 12.5 MG tablet Take 1 tablet (12.5 mg total) by mouth 2 (two) times daily.  . cholecalciferol (VITAMIN D) 1000 UNITS tablet Take 5,000 Units by mouth daily.   [name] doxycycline (VIBRA-TABS) 100 MG tablet Take 1 tablet (100 mg total) by mouth 2 (two) times daily.  [name] escitalopram (LEXAPRO) 10 MG tablet Take 1 tablet (10 mg total) by mouth daily.  . fexofenadine (ALLEGRA) 180 MG tablet Take 180 mg by mouth daily as needed for allergies or rhinitis.  . fluticasone (FLONASE) 50 MCG/ACT nasal spray Place 2 sprays into both nostrils daily as needed for allergies or rhinitis.  . furosemide (LASIX) 20 MG tablet Take 1 tablet (20 mg total) by mouth daily.  . hydroxypropyl methylcellulose / hypromellose (ISOPTO TEARS / GONIOVISC) 2.5 % ophthalmic solution Place 1 drop into both eyes daily.  [name] latanoprost (XALATAN) 0.005 % ophthalmic solution Place 1 drop into both eyes at bedtime.  [name] levothyroxine (SYNTHROID, LEVOTHROID) 50 MCG tablet Take 1 tablet (50 mcg total) by mouth daily.  . mometasone (ELOCON) 0.1 % lotion Apply 1 application topically daily as needed (rash).   . montelukast (SINGULAIR) 10 MG tablet Take 1 tablet (10 mg total) by mouth at bedtime.  . Multiple Vitamins-Minerals (MULTIVITAMIN PO) Take 1  tablet by mouth daily.  . NON FORMULARY HEMP/CBD OIL -takes a few drops or a pill prn  . pantoprazole (PROTONIX) 40 MG tablet Take 1 tablet (40 mg total) by mouth 2 (two) times daily before a meal.  . polyethylene glycol powder (GLYCOLAX/MIRALAX) powder Take 17 grams by mouth daily  . potassium chloride (K-DUR) 10 MEQ tablet TAKE 1/2 TABLET (5MEQ TOTAL) EVERY DAY (Patient taking differently: Take 5 mEq by mouth daily. TAKE 1/2 TABLET (5MEQ TOTAL) EVERY DAY)  . Probiotic Product (PROBIOTIC-10 PO) Take 1 capsule by mouth daily.   [name] Respiratory Therapy Supplies (FLUTTER) DEVI 1 Device by Does not apply route as needed.  . simvastatin (ZOCOR) 40 MG tablet TAKE 1/2 TABLET  (20  MG  TOTAL) EVERY DAY  . traMADol (ULTRAM) 50 MG tablet Take 1 tablet (50 mg total) by mouth at bedtime.  [name] zolpidem (AMBIEN) 10 MG tablet TAKE 1 TABLET BY MOUTH AT BEDTIME AS NEEDED FOR SLEEP  . [DISCONTINUED] Respiratory Therapy Supplies (FLUTTER) DEVI 1 application by Does not apply route 2 (two) times daily before lunch and supper.  . benzonatate (TESSALON) 200 MG capsule Take 1 capsule (200 mg total) by mouth 3 (three) times daily as needed. Do not bite pill (Patient not taking: Reported on 10/16/2018)   No facility-administered encounter medications on file as of 10/16/2018.      Review of Systems  Review of Systems  Constitutional: Negative.  Negative for chills and fever.  HENT: Negative.  Negative for congestion, sinus pressure and sinus pain.   Respiratory: Positive for cough. Negative for shortness of breath and wheezing.   Cardiovascular: Negative.  Negative for chest pain, palpitations and leg swelling.  Gastrointestinal: Negative.   Allergic/Immunologic: Negative.   Neurological: Negative.   Psychiatric/Behavioral: Negative.        Physical Exam  BP 100/70 (BP Location: Left Arm, Patient Position: Sitting, Cuff Size: Normal)   Pulse 65   Ht 5' 6.25" (1.683 m)   Wt 220 lb (99.8 kg)   LMP  (LMP  Unknown)   SpO2 95%   BMI 35.24 kg/m   Wt Readings from Last 5 Encounters:  10/16/18 220 lb (99.8 kg)  09/22/18 225 lb (102.1 kg)  09/19/18 224 lb 3.2 oz (101.7 kg)  07/11/18 221 lb 8 oz (100.5 kg)  05/14/18 223 lb 6.4 oz (101.3 kg)     Physical Exam Vitals signs and nursing note reviewed.  Constitutional:      General: She is not in acute distress.    Appearance: She is well-developed.  Cardiovascular:     Rate and Rhythm: Normal rate and regular rhythm.  Pulmonary:     Effort: Pulmonary effort is normal. No respiratory distress.     Breath sounds: Normal breath sounds. No wheezing or rhonchi.  Musculoskeletal:        General: No swelling.  Neurological:     Mental Status: She is alert and oriented to person, place, and time.     Imaging: Dg Chest 2 View  Result Date: 09/19/2018 CLINICAL DATA:  Shortness of breath, dry cough EXAM: CHEST - 2 VIEW COMPARISON:  11/20/2016 FINDINGS: Left AICD remains in place, unchanged. Cardiomegaly. No confluent airspace opacities, effusions or edema. No acute bony abnormality. IMPRESSION: Cardiomegaly.  No active disease. Electronically Signed   By: Rolm Baptise M.D.   On: 09/19/2018 18:23   Ct Chest High Resolution  Result Date: 10/08/2018 CLINICAL DATA:  Interstitial pulmonary disease. EXAM: CT CHEST WITHOUT CONTRAST TECHNIQUE: Multidetector CT imaging of the chest was performed following the standard protocol without intravenous contrast. High resolution imaging of the lungs, as well as inspiratory and expiratory imaging, was performed. COMPARISON:  10/26/2015 and 04/10/2012. FINDINGS: Cardiovascular: Atherosclerotic calcification of the aorta and coronary arteries. Heart is enlarged. No pericardial effusion. Mediastinum/Nodes: No pathologically enlarged mediastinal or axillary lymph nodes. Hilar regions are difficult to evaluate without IV contrast. Esophagus is grossly unremarkable. Lungs/Pleura: Basilar predominant cylindrical  bronchiectasis with some cystic components, architectural distortion and slight peribronchovascular nodularity. Findings are similar to 04/10/2012. There may be slight nodularity along the fissures. No definitive subpleural reticulation, ground-glass or honeycombing. Mild centrilobular and paraseptal emphysema. No pleural fluid. Airway is unremarkable. There is air trapping. Upper Abdomen: Visualized portions of the liver, adrenal glands, kidneys, spleen pancreas stomach and bowel are grossly unremarkable. Portacaval lymph node measures 10 mm, similar. Musculoskeletal: Degenerative changes in the spine. No worrisome lytic or sclerotic lesions. IMPRESSION: 1. Pulmonary parenchymal pattern of bronchiectasis, architectural distortion and slight nodularity, similar to 04/10/2012. Findings may be post infectious/postinflammatory in etiology. Sarcoid is not excluded. Given air trapping, chronic hypersensitivity pneumonitis is also considered. Findings are suggestive of an alternative diagnosis (not UIP) per consensus guidelines: Diagnosis of Idiopathic Pulmonary Fibrosis: An Official ATS/ERS/JRS/ALAT Clinical Practice Guideline. [place], Iss 5, [phone], Apr 20 2017. 2. Aortic atherosclerosis (ICD10-170.0). Coronary artery calcification. 3.  Emphysema (ICD10-J43.9). Electronically Signed   By: Lorin Picket M.D.   On: 10/08/2018 16:34     Assessment & Plan:   Bronchiectasis with acute exacerbation (HCC) Exacerbation has resolved.  Patient presents today for a follow-up with PFT.  Her PFT is stable with slight improvement in DLCO.  Patient did have HRCT which showed bronchiectasis.  Will order labs today -concerned that CT report stated that sarcoid and chronic per sensitivity pneumonitis could not be excluded.  Patient has been stable since last visit.  Encouraged her to use flutter valve device more often.  Will order repeat follow-up HRCT in 6 months with follow-up visit after with Dr.  Halford Chessman.  Patient Instructions  Will order labs and call with results PFT was stable today  Upper airway cough  syndrome with allergic rhinitis. - continue singulair, flonase, nasal irrigation, allegra  Obstructive sleep apnea. - she is compliant with CPAP and reports benefit - continue CPAP 7 cm H2O  Bronchiectasis. - prn mucinex, ventolin - flutter valve 3 times daily  Follow up With Dr. Halford Chessman in 6 months after CT scan or sooner if needed       Fenton Foy, NP 10/16/2018

## 2018-10-18 NOTE — Progress Notes (Signed)
Reviewed and agree with assessment/plan.   Annalise Mcdiarmid, MD  Pulmonary/Critical Care 08/15/2016, 12:24 PM Pager:  [phone]  

## 2018-10-22 LAB — HYPERSENSITIVITY PNUEMONITIS PROFILE
ASPERGILLUS FUMIGATUS: NEGATIVE
Faenia retivirgula: NEGATIVE
Pigeon Serum: NEGATIVE
S. VIRIDIS: NEGATIVE
T. CANDIDUS: NEGATIVE
T. VULGARIS: NEGATIVE

## 2018-10-22 LAB — ANGIOTENSIN CONVERTING ENZYME: ANGIOTENSIN-CONVERTING ENZYME: 25 U/L (ref 9–67)

## 2018-10-24 ENCOUNTER — Telehealth: Payer: Self-pay | Admitting: Nurse Practitioner

## 2018-10-24 NOTE — Telephone Encounter (Signed)
LVM to advise the patient of results from labs on 10/16/18 related to the office visit with Lazaro Arms, NP on the same date.  Results were negative requested patient call back if there were any additional questions or if she is not feeling better since 10/16/18 office visit.

## 2018-10-27 ENCOUNTER — Ambulatory Visit: Payer: Self-pay | Admitting: Pulmonary Disease

## 2018-10-29 NOTE — Progress Notes (Deleted)
Office Visit Note  Patient: [name]             Date of Birth: 1950-11-21           MRN: [id number]             PCP: Abner Greenspan, MD Referring: Tower, [name], MD Visit Date: 11/12/2018 Occupation: @GUAROCC @  Subjective:  No chief complaint on file.   History of Present Illness: [name] is a 68 y.o. female ***   Activities of Daily Living:  Patient reports morning stiffness for *** {minute/hour:19697}.   Patient {ACTIONS;DENIES/REPORTS:21021675::"Denies"} nocturnal pain.  Difficulty dressing/grooming: {ACTIONS;DENIES/REPORTS:21021675::"Denies"} Difficulty climbing stairs: {ACTIONS;DENIES/REPORTS:21021675::"Denies"} Difficulty getting out of chair: {ACTIONS;DENIES/REPORTS:21021675::"Denies"} Difficulty using hands for taps, buttons, cutlery, and/or writing: {ACTIONS;DENIES/REPORTS:21021675::"Denies"}  No Rheumatology ROS completed.   PMFS History:  Patient Active Problem List   Diagnosis Date Noted   Viral URI with cough 07/11/2018   Pre-op exam 01/21/2018   Neck pain 10/25/2017   Paresthesias in left hand 10/25/2017   Dry nose 07/19/2017   Paresthesia of foot, bilateral 04/12/2017   Itching 02/22/2017   Primary osteoarthritis of both hands 09/27/2016   Primary osteoarthritis of both knees 09/27/2016   Urinary frequency 09/26/2016   Screen for STD (sexually transmitted disease) 09/26/2016   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   Nasopharyngitis, 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/2016   Postnasal drip 05/18/2016   S/P total knee replacement 02/13/2016   Chronic pain of both knees 12/06/2015   Globus pharyngeus 05/17/2015   Productive cough 05/17/2015   Genetic testing 12/23/2014   Family history of breast cancer    Family history of colon cancer    Family history of pancreatic cancer    Breast cancer of upper-outer quadrant of right female breast ([place]) 11/23/2014   Allergy to ACE inhibitors 09/27/2014   Encounter for Medicare annual  wellness exam 06/27/2014   Hyperglycemia 06/23/2013   Routine general medical examination at a health care facility 06/23/2013   RLS (restless legs syndrome) 01/15/2013   Hx of Clostridium difficile infection 10/10/2012   Bronchiectasis with acute exacerbation ([place]) 04/29/2012   Dyspnea on exertion 04/02/2012   Non-ischemic cardiomyopathy ([place]) 03/07/2012   Colon cancer screening 01/11/2012   Post-menopausal 01/11/2012   Chest pain 11/07/2011   Palpitations 04/17/2011   Abnormal tympanic membrane 01/22/2011   Obstructive sleep apnea 04/04/2010   VENTRICULAR TACHYCARDIA 01/03/2009   ICD  [employer]  Single chamber 01/03/2009   PATENT FORAMEN OVALE 09/29/2008   Vitamin D deficiency 08/17/2008   Hypothyroidism 11/18/2006   Hyperlipidemia 11/18/2006   Depressed mood 11/18/2006   Essential hypertension 11/18/2006   MITRAL VALVE PROLAPSE 11/18/2006   CEREBRAL ANEURYSM 11/18/2006   Allergic rhinitis 11/18/2006   GERD 11/18/2006   DIVERTICULOSIS, COLON 11/18/2006   Fatty liver 11/18/2006   Fibromyalgia 11/18/2006    Past Medical History:  Diagnosis Date   AICD (automatic cardioverter/defibrillator) present    Anxiety    Arthritis    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   Cerebral aneurysm, nonruptured    had a clip put in   Clostridium difficile infection    Depressive disorder, not elsewhere classified    Diverticulosis of colon (without mention of hemorrhage)    Esophageal reflux    Fibromyalgia    Gastritis    GERD (gastroesophageal reflux disease)    GI bleed 2004   Hiatal hernia    Hyperlipidemia    Hypertension    Hypothyroidism  Internal hemorrhoids    Obstructive sleep apnea (adult) (pediatric)    Ostium secundum type atrial septal defect    Other chronic nonalcoholic liver disease    Other pulmonary embolism and infarction    Paroxysmal ventricular tachycardia (HCC)     Personal history of radiation therapy    Pneumonia    history   Presence of permanent cardiac pacemaker    PUD (peptic ulcer disease)    Radiation 02/03/15-03/10/15   Right Breast   Sarcoid    per pt , not sure   Schatzki's ring    Stroke [facility]) 2013   tia/ pt feels it was around 2008 0r 2009   Takotsubo syndrome    Tubular adenoma of colon    Unspecified transient cerebral ischemia    Unspecified vitamin D deficiency     Family History  Problem Relation Age of Onset   Diabetes Mother    Alzheimer's disease Mother    Other Brother        Thyroid problem 10/2016   Allergies Sister    Heart disease Sister    Heart disease Brother    Prostate cancer Brother 79       same brother as throat cancer   Throat cancer Brother        dx in his 86s; also a smoker   Heart disease Father    Cancer Maternal Grandmother 61       colon cancer or abdominal cancer   Lung cancer Maternal Grandfather 78   Breast cancer Maternal Aunt 55   Colon cancer Maternal Aunt 61       same sister as breast at 40   Lung cancer Maternal Uncle    Breast cancer Maternal Aunt        dx in her 56s   Pancreatic cancer Cousin 60       maternal first cousin   Breast cancer Cousin        paternal first cousin twice removed died in her 70s   Cardiomyopathy Other        Family history   Heart disease Son        Cardiac Arrest 07/2016   Stroke Neg Hx    Past Surgical History:  Procedure Laterality Date   ABDOMINAL HYSTERECTOMY     ABI  2006   normal   BREAST EXCISIONAL BIOPSY     BREAST LUMPECTOMY     right breast 2016   BREAST LUMPECTOMY WITH RADIOACTIVE SEED LOCALIZATION Right 01/03/2015   Procedure: BREAST LUMPECTOMY WITH RADIOACTIVE SEED LOCALIZATION;  Surgeon: Autumn Messing III, MD;  Location: [place];  Service: General;  Laterality: Right;   CARDIAC DEFIBRILLATOR PLACEMENT  2006; 2012   BSX single chamber ICD   carotid dopplers  2006   neg   CEREBRAL ANEURYSM  REPAIR  02/1999   COLONOSCOPY     hospitalization  2004   GI bleed, PUD, diverticulosis (EGD,colonscopy)   hospitalization     PE, NSVT, s/p defib   JOINT REPLACEMENT  2017   left knee    KNEE ARTHROSCOPY     bilateral   LEFT HEART CATHETERIZATION WITH CORONARY ANGIOGRAM N/A 02/22/2012   Procedure: LEFT HEART CATHETERIZATION WITH CORONARY ANGIOGRAM;  Surgeon: Burnell Blanks, MD;  Location[facility] CATH LAB;  Service: Cardiovascular;  Laterality: N/A;   PARTIAL HYSTERECTOMY     Fibroids   TONGUE BIOPSY  12/12/2017   due to sore tongue and white patches/abnormal cells   TOTAL KNEE ARTHROPLASTY  Left 02/13/2016   Procedure: TOTAL KNEE ARTHROPLASTY;  Surgeon: Vickey Huger, MD;  Location: [place];  Service: Orthopedics;  Laterality: Left;   UPPER GASTROINTESTINAL ENDOSCOPY     Social History   Social History Narrative   Lives alone   Caffeine XNA:TFTD   Retired from [employer]   2 children   Divorced   Right handed    Immunization History  Administered Date(s) Administered   H1N1 07/24/2008   Influenza Split 07/17/2011, 09/27/2011, 04/29/2012   Influenza Whole 07/20/2004, 07/04/2007, 05/07/2008, 05/19/2009, 05/17/2010   Influenza, High Dose Seasonal PF 05/30/2017, 05/21/2018   Influenza,inj,Quad PF,6+ Mos 05/20/2013, 05/06/2014, 05/17/2015, 05/15/2016   Influenza-Unspecified 05/07/2018   Pneumococcal Conjugate-13 11/22/2015   Pneumococcal Polysaccharide-23 05/20/2002, 05/07/2008, 06/25/2014   Td 09/07/2009   Zoster 09/21/2011     Objective: Vital Signs: LMP  (LMP Unknown)    Physical Exam   Musculoskeletal Exam: ***  CDAI Exam: CDAI Score: Not documented Patient Global Assessment: Not documented; Provider Global Assessment: Not documented Swollen: Not documented; Tender: Not documented Joint Exam   Not documented   There is currently no information documented on the homunculus. Go to the Rheumatology activity and complete the homunculus joint  exam.  Investigation: No additional findings.  Imaging: Ct Chest High Resolution  Result Date: 10/08/2018 CLINICAL DATA:  Interstitial pulmonary disease. EXAM: CT CHEST WITHOUT CONTRAST TECHNIQUE: Multidetector CT imaging of the chest was performed following the standard protocol without intravenous contrast. High resolution imaging of the lungs, as well as inspiratory and expiratory imaging, was performed. COMPARISON:  10/26/2015 and 04/10/2012. FINDINGS: Cardiovascular: Atherosclerotic calcification of the aorta and coronary arteries. Heart is enlarged. No pericardial effusion. Mediastinum/Nodes: No pathologically enlarged mediastinal or axillary lymph nodes. Hilar regions are difficult to evaluate without IV contrast. Esophagus is grossly unremarkable. Lungs/Pleura: Basilar predominant cylindrical bronchiectasis with some cystic components, architectural distortion and slight peribronchovascular nodularity. Findings are similar to 04/10/2012. There may be slight nodularity along the fissures. No definitive subpleural reticulation, ground-glass or honeycombing. Mild centrilobular and paraseptal emphysema. No pleural fluid. Airway is unremarkable. There is air trapping. Upper Abdomen: Visualized portions of the liver, adrenal glands, kidneys, spleen pancreas stomach and bowel are grossly unremarkable. Portacaval lymph node measures 10 mm, similar. Musculoskeletal: Degenerative changes in the spine. No worrisome lytic or sclerotic lesions. IMPRESSION: 1. Pulmonary parenchymal pattern of bronchiectasis, architectural distortion and slight nodularity, similar to 04/10/2012. Findings may be post infectious/postinflammatory in etiology. Sarcoid is not excluded. Given air trapping, chronic hypersensitivity pneumonitis is also considered. Findings are suggestive of an alternative diagnosis (not UIP) per consensus guidelines: Diagnosis of Idiopathic Pulmonary Fibrosis: An Official ATS/ERS/JRS/ALAT Clinical Practice  Guideline. [address], Iss 5, [phone], Apr 20 2017. 2. Aortic atherosclerosis (ICD10-170.0). Coronary artery calcification. 3.  Emphysema (ICD10-J43.9). Electronically Signed   By: Lorin Picket M.D.   On: 10/08/2018 16:34    Recent Labs: Lab Results  Component Value Date   WBC 4.1 11/27/2017   HGB 12.9 11/27/2017   PLT 205.0 11/27/2017   NA 140 01/30/2018   K 3.5 01/30/2018   CL 103 01/30/2018   CO2 29 01/30/2018   GLUCOSE 90 01/30/2018   BUN 9 01/30/2018   CREATININE 0.80 01/30/2018   BILITOT 0.4 01/30/2018   ALKPHOS 115 01/30/2018   AST 36 01/30/2018   ALT 39 (H) 01/30/2018   PROT 7.9 01/30/2018   ALBUMIN 4.2 01/30/2018   CALCIUM 10.1 01/30/2018   GFRAA 85 02/27/2017    Speciality Comments:  Narc Agreement: 01/16/18  Procedures:  No procedures performed Allergies: Ace inhibitors; Dilantin [phenytoin sodium extended]; Atorvastatin; Ramipril; Rosuvastatin; Iodine-131; Paroxetine; Wellbutrin [bupropion]; Amitriptyline hcl; Carbamazepine; Codeine; and Phenytoin sodium extended   Assessment / Plan:     Visit Diagnoses: Fibromyalgia - tramadol  Chronic right shoulder pain  Primary osteoarthritis of both hands  Primary osteoarthritis of right knee  Status post total knee replacement, left  Trochanteric bursitis, right hip  DDD (degenerative disc disease), cervical - Dr. Jannifer Franklin  RLS (restless legs syndrome)  History of gastroesophageal reflux (GERD)  History of TIA (transient ischemic attack)  History of breast cancer  History of hypertension  History of hypothyroidism  Brain aneurysm  History of depression  History of sleep apnea  History of migraine  History of hypercholesterolemia  History of vitamin D deficiency  History of diverticulosis  Hx of Clostridium difficile infection   Orders: No orders of the defined types were placed in this encounter.  No orders of the defined types were placed in this  encounter.   Face-to-face time spent with patient was *** minutes. Greater than 50% of time was spent in counseling and coordination of care.  Follow-Up Instructions: No follow-ups on file.   Ofilia Neas, PA-C  Note - This record has been created using Dragon software.  Chart creation errors have been sought, but may not always  have been located. Such creation errors do not reflect on  the standard of medical care.

## 2018-11-10 ENCOUNTER — Other Ambulatory Visit: Payer: Self-pay | Admitting: Family Medicine

## 2018-11-10 ENCOUNTER — Telehealth: Payer: Self-pay | Admitting: Rheumatology

## 2018-11-10 DIAGNOSIS — G8929 Other chronic pain: Secondary | ICD-10-CM

## 2018-11-10 DIAGNOSIS — Z5181 Encounter for therapeutic drug level monitoring: Secondary | ICD-10-CM

## 2018-11-10 NOTE — Telephone Encounter (Signed)
Patient called requesting prescription refill of Tramadol to be sent to [facility] at [address] in [place].   Patient states she rescheduled her appointment due to Coronavirus.

## 2018-11-11 NOTE — Telephone Encounter (Addendum)
Last Visit: 05/14/18 Next Visit: 01/14/19 UDS: 01/16/18 Narc Agreement: 05/14/18  Patient advised she is due to update UDS and narcotic agreement. Patient advised of lab hours. Patient will come to update. Orders placed.

## 2018-11-12 ENCOUNTER — Telehealth: Payer: Self-pay | Admitting: Family Medicine

## 2018-11-12 ENCOUNTER — Ambulatory Visit: Payer: Medicare Other | Admitting: Physician Assistant

## 2018-11-12 NOTE — Telephone Encounter (Signed)
error 

## 2018-11-13 ENCOUNTER — Encounter: Payer: Self-pay | Admitting: Family Medicine

## 2018-11-13 ENCOUNTER — Ambulatory Visit (INDEPENDENT_AMBULATORY_CARE_PROVIDER_SITE_OTHER): Payer: Medicare Other | Admitting: Family Medicine

## 2018-11-13 ENCOUNTER — Other Ambulatory Visit: Payer: Self-pay

## 2018-11-13 DIAGNOSIS — Z170 Estrogen receptor positive status [ER+]: Secondary | ICD-10-CM

## 2018-11-13 DIAGNOSIS — Z853 Personal history of malignant neoplasm of breast: Secondary | ICD-10-CM | POA: Diagnosis not present

## 2018-11-13 DIAGNOSIS — F418 Other specified anxiety disorders: Secondary | ICD-10-CM | POA: Diagnosis not present

## 2018-11-13 DIAGNOSIS — M797 Fibromyalgia: Secondary | ICD-10-CM | POA: Diagnosis not present

## 2018-11-13 DIAGNOSIS — C50411 Malignant neoplasm of upper-outer quadrant of right female breast: Secondary | ICD-10-CM

## 2018-11-13 MED ORDER — DULOXETINE HCL 20 MG PO CPEP: 20.0000 mg | ORAL_CAPSULE | Freq: Every day | ORAL | 5 refills | Status: DC

## 2018-11-13 NOTE — Progress Notes (Signed)
Virtual Visit via Telephone Note  I connected with [name] on 11/13/18 at 11:15 AM EDT by telephone and verified that I am speaking with the correct person using two identifiers.   I discussed the limitations, risks, security and privacy concerns of performing an evaluation and management service by telephone and the availability of in person appointments. I also discussed with the patient that there may be a patient responsible charge related to this service. The patient expressed understanding and agreed to proceed.   History of Present Illness: Overwhelmed Worse depression  Isolated -by herself   Breathing problems make her more anxious also   Support-daughter/sister and girl friends   Counseling - was last sept/oct  Stressed her out "to have to go to him"   Working in house and yard  Does bible study   Still has to pay rent for grand daughter who is laid off  Better with her being out of the house   Things on TV make her cry   Last medicine for depression was lexapro  She stopped taking it  Side effects: headache/ did not feel well / light headed /tired  Emotionally felt a little better / sleeping better    wellbutrin - palpitations  paroxitine - rapid HR Amitriptyline- very sedating  She really wants to try another medicine for depression and anxiety    Review of Systems  Constitutional: Negative for chills, fever and malaise/fatigue.  HENT: Negative for congestion and sore throat.   Eyes: Negative for blurred vision.  Respiratory: Negative for cough and shortness of breath.   Cardiovascular: Positive for palpitations. Negative for chest pain, claudication and leg swelling.  Gastrointestinal: Negative for abdominal pain and nausea.  Musculoskeletal:       Aches and pains- she pushes through  Skin: Negative for itching and rash.  Neurological: Negative for dizziness, sensory change, speech change, focal weakness and headaches.  Psychiatric/Behavioral:  Positive for depression. Negative for hallucinations, memory loss and suicidal ideas. The patient is nervous/anxious and has insomnia.     Observations/Objective: Patient sounds anxious but not tearful  Pleasant and talkative Talks freely about stressors and symptoms    Assessment and Plan: Problem List Items Addressed This Visit      Other   Depression with anxiety - Primary    Worsened with recent stress of covid crisis and effort to socially distance  She is lonely and anxious Reviewed stressors/ coping techniques/symptoms/ support sources/ tx options and side effects in detail today  Enc her strongly to call her counselor Debbe Bales) for a phone appt (she is agreeable)  Rev past side eff of medications in detail - 2 ssri and wellbutrin Will try very low dose cymbalta 20 mg  Discussed expectations of this medication including time to effectiveness and mechanism of action, also poss of side effects (early and late)- including mental fuzziness, weight or appetite change, nausea and poss of worse dep or anxiety (even suicidal thoughts)  Pt voiced understanding and will stop med and update if this occurs    Asked her to practice good self care - she is  inst her to update re: how she is doing in 2 wk or earlier if needed      Relevant Medications   DULoxetine (CYMBALTA) 20 MG capsule   Fibromyalgia    Trying cymbalta for dep/anx Will see if this helps      Relevant Medications   DULoxetine (CYMBALTA) 20 MG capsule   Breast cancer of upper-outer  quadrant of right female breast ([place])       Follow Up Instructions: Schedule a follow up with Debbe Bales -your counselor Practice good self care Talk to friends /family by phone Start cymbalta 20 mg and update [place] in several weeks with how you are doing Stop if side effects     I discussed the assessment and treatment plan with the patient. The patient was provided an opportunity to ask questions and all were answered. The  patient agreed with the plan and demonstrated an understanding of the instructions.   The patient was advised to call back or seek an in-person evaluation if the symptoms worsen or if the condition fails to improve as anticipated.  I provided 15 minutes of non-face-to-face time during this encounter.   Loura Pardon, MD

## 2018-11-14 NOTE — Assessment & Plan Note (Signed)
Trying cymbalta for dep/anx Will see if this helps

## 2018-11-14 NOTE — Assessment & Plan Note (Signed)
Worsened with recent stress of covid crisis and effort to socially distance  She is lonely and anxious Reviewed stressors/ coping techniques/symptoms/ support sources/ tx options and side effects in detail today  Enc her strongly to call her counselor Debbe Bales) for a phone appt (she is agreeable)  Rev past side eff of medications in detail - 2 ssri and wellbutrin Will try very low dose cymbalta 20 mg  Discussed expectations of this medication including time to effectiveness and mechanism of action, also poss of side effects (early and late)- including mental fuzziness, weight or appetite change, nausea and poss of worse dep or anxiety (even suicidal thoughts)  Pt voiced understanding and will stop med and update if this occurs    Asked her to practice good self care - she is  inst her to update re: how she is doing in 2 wk or earlier if needed

## 2018-11-14 NOTE — Assessment & Plan Note (Signed)
No clinical changes Doing well

## 2018-11-18 ENCOUNTER — Other Ambulatory Visit: Payer: Self-pay | Admitting: Family Medicine

## 2018-11-18 ENCOUNTER — Telehealth: Payer: Self-pay

## 2018-11-18 NOTE — Telephone Encounter (Signed)
Since it is a controlled substance it cannot be refilled early - is that the question she had?

## 2018-11-18 NOTE — Telephone Encounter (Signed)
Received inbound call from patient regarding refill of Ambien. Patient states pharmacy told her that it was too soon for refill. Patient states she does understand why she must wait until November 24, 2018 get refill. Please contact patient.

## 2018-11-18 NOTE — Telephone Encounter (Signed)
I explained to pt that refill was early and gave her the filled date. Pt said she will have pharmacy send [place] a refill request on Friday

## 2018-11-22 ENCOUNTER — Other Ambulatory Visit: Payer: Self-pay | Admitting: Family Medicine

## 2018-11-24 NOTE — Telephone Encounter (Signed)
Patient left a voicemail stating that she is out of her Ambien and needs a refill sent to the pharmacy. See previous phone note.  Name of Medication: Ambien Name of Pharmacy: [facility] or Written Date and Quantity: 05/26/18 #90/1 Last Office Visit and Type: 11/13/18 Depression Next Office Visit and Type: 12/04/18 AMW Last Controlled Substance Agreement Date: 11/30/16 Last UDS: 06/01/17

## 2018-11-25 ENCOUNTER — Telehealth: Payer: Self-pay | Admitting: Neurology

## 2018-11-25 NOTE — Telephone Encounter (Signed)
Due to current COVID 19 pandemic, our office is severely reducing in office visits for at least the next 2 weeks, in order to minimize the risk to our patients and healthcare providers. Pt understands that although there may be some limitations with this type of visit, we will take all precautions to reduce any security or privacy concerns.  Pt understands that this will be treated like an in office visit and we will file with pt's insurance, and there may be a patient responsible charge related to this service. Pt's email is [email or web address]. Pt understands that the [employer] webex software must be downloaded and operational on the device pt plans to use for the visit.

## 2018-11-26 ENCOUNTER — Encounter: Payer: Self-pay | Admitting: Neurology

## 2018-11-26 ENCOUNTER — Other Ambulatory Visit: Payer: Self-pay

## 2018-11-26 ENCOUNTER — Ambulatory Visit (INDEPENDENT_AMBULATORY_CARE_PROVIDER_SITE_OTHER): Payer: Medicare Other | Admitting: Neurology

## 2018-11-26 DIAGNOSIS — M797 Fibromyalgia: Secondary | ICD-10-CM

## 2018-11-26 NOTE — Progress Notes (Signed)
     Virtual Visit via Video Note  I connected with [name] on 11/26/18 at 12:00 PM EDT by a video enabled telemedicine application and verified that I am speaking with the correct person using two identifiers.   I discussed the limitations of evaluation and management by telemedicine and the availability of in person appointments. The patient expressed understanding and agreed to proceed.  History of Present Illness: [name] is a 68 year old right-handed black female with a history of fibromyalgia, currently followed by Dr. Estanislado Pandy.  The patient has been seen here for some problems with left arm paresthesias and hand numbness, this has since improved.  She was last seen with right shoulder pain, it was felt to be intrinsic shoulder tendinopathy or arthritis, she has received an injection in the right shoulder with some benefit.  She still does not have complete full range of movement the right shoulder.  She does occasionally have some neck pain as well.  She is trying to remain somewhat active, she does yard work intermittently.  She is sleeping well at night, she has a good energy level during the day.  She has some Ultram to take if needed for discomfort.  She was given a prescription for gabapentin but could not tolerate even a low dose as it made her feel poorly.  She was recently placed on Cymbalta in part because of some situational depression, but she could not tolerate even a 20 mg dose.   Observations/Objective: The WebEx evaluation reveals that the patient is alert and cooperative, she has normal speech pattern with no aphasia or dysarthria noted.  The patient has good range of movement the cervical spine.  With elevation of the arms, the patient has normal elevation of the left arm and lacks about 15 degrees of full elevation of the right arm.  The patient has full extraocular movements.  Assessment and Plan: 1.  Fibromyalgia  2.  Right shoulder pain, intrinsic shoulder  disease  The patient is doing fairly well at this point.  She is not getting any medications through this office.  We will see the patient on an as-needed basis.  She will call if there are any concerns.  Follow Up Instructions: Follow-up here if needed.   I discussed the assessment and treatment plan with the patient. The patient was provided an opportunity to ask questions and all were answered. The patient agreed with the plan and demonstrated an understanding of the instructions.   The patient was advised to call back or seek an in-person evaluation if the symptoms worsen or if the condition fails to improve as anticipated.  I provided 15 minutes of non-face-to-face time during this encounter.   Kathrynn Ducking, MD

## 2018-11-26 NOTE — Telephone Encounter (Signed)
I have completed the pre charting for the scheduled video visit for 11/26/18.

## 2018-11-28 ENCOUNTER — Telehealth: Payer: Medicare Other | Admitting: Gastroenterology

## 2018-11-28 DIAGNOSIS — Z7189 Other specified counseling: Secondary | ICD-10-CM

## 2018-11-28 DIAGNOSIS — J029 Acute pharyngitis, unspecified: Secondary | ICD-10-CM | POA: Diagnosis not present

## 2018-11-28 NOTE — Progress Notes (Signed)
See other mychart message.

## 2018-11-28 NOTE — Progress Notes (Signed)
We are sorry that you are not feeling well.  Here is how we plan to help!  Your symptoms indicate a likely viral infection (Pharyngitis).   Pharyngitis is inflammation in the back of the throat which can cause a sore throat, scratchiness and sometimes difficulty swallowing.   Pharyngitis is typically caused by a respiratory virus and will just run its course.  Please keep in mind that your symptoms could last up to 10 days.  For throat pain, we recommend over the counter oral pain relief medications such as acetaminophen or aspirin, or anti-inflammatory medications such as ibuprofen or naproxen sodium.  Topical treatments such as oral throat lozenges or sprays may be used as needed.  Avoid close contact with loved ones, especially the very young and elderly.  Remember to wash your hands thoroughly throughout the day as this is the number one way to prevent the spread of infection and wipe down door knobs and counters with disinfectant.  After careful review of your answers, I would not recommend and antibiotic for your condition.  Antibiotics should not be used to treat conditions that we suspect are caused by viruses like the virus that causes the common cold or flu. However, some people can have Strep with atypical symptoms. You may need formal testing in clinic or office to confirm if your symptoms continue or worsen.  Providers prescribe antibiotics to treat infections caused by bacteria. Antibiotics are very powerful in treating bacterial infections when they are used properly.  To maintain their effectiveness, they should be used only when necessary.  Overuse of antibiotics has resulted in the development of super bugs that are resistant to treatment!    Home Care:  Only take medications as instructed by your medical team.  Do not drink alcohol while taking these medications.  A steam or ultrasonic humidifier can help congestion.  You can place a towel over your head and breathe in the steam from  hot water coming from a faucet.  Avoid close contacts especially the very young and the elderly.  Cover your mouth when you cough or sneeze.  Always remember to wash your hands.  Get Help Right Away If:  You develop worsening fever or throat pain.  You develop a severe head ache or visual changes.  Your symptoms persist after you have completed your treatment plan.  Make sure you  Understand these instructions.  Will watch your condition.  Will get help right away if you are not doing well or get worse.  Your e-visit answers were reviewed by a board certified advanced clinical practitioner to complete your personal care plan.  Depending on the condition, your plan could have included both over the counter or prescription medications.  If there is a problem please reply  once you have received a response from your provider.  Your safety is important to [place].  If you have drug allergies check your prescription carefully.    You can use MyChart to ask questions about todays visit, request a non-urgent call back, or ask for a work or school excuse for 24 hours related to this e-Visit. If it has been greater than 24 hours you will need to follow up with your provider, or enter a new e-Visit to address those concerns.  You will get an e-mail in the next two days asking about your experience.  I hope that your e-visit has been valuable and will speed your recovery. Thank you for using e-visits.    Coronavirus disease 2019 (  COVID-19) is a respiratory illness that can spread from person to person. The virus that causes COVID-19 is a new virus that was first identified in the country of [place] but is now found in multiple other countries and has spread to the [place].  Symptoms associated with the virus are mild to severe fever, cough, and shortness of breath. There is currently no vaccine to protect against COVID-19, and there is no specific antiviral treatment for the virus.   To be  considered HIGH RISK for Coronavirus (COVID-19), you have to meet the following criteria:  . Traveled to [place], [place], [place], [place] or [place]; or in the [place] to [place], [place], [place], or [place]; and have fever, cough, and shortness of breath within the last 2 weeks of travel OR  . Been in close contact with a person diagnosed with COVID-19 within the last 2 weeks and have fever, cough, and shortness of breath  . IF YOU DO NOT MEET THESE CRITERIA, YOU ARE CONSIDERED LOW RISK FOR COVID-19.   It is vitally important that if you feel that you have an infection such as this virus or any other virus that you stay home and away from places where you may spread it to others.  You should self-quarantine for 14 days if you have symptoms that could potentially be coronavirus and avoid contact with people age 52 and older.   If you develop cough you can use over the counter Mucinex (without the D).  You may also take acetaminophen (Tylenol) as needed for fever.   Reduce your risk of any infection by using the same precautions used for avoiding the common cold or flu:  [name] Wash your hands often with soap and warm water for at least 20 seconds.  If soap and water are not readily available, use an alcohol-based hand sanitizer with at least 60% alcohol.  . If coughing or sneezing, cover your mouth and nose by coughing or sneezing into the elbow areas of your shirt or coat, into a tissue or into your sleeve (not your hands). . Avoid shaking hands with others and consider head nods or verbal greetings only. . Avoid touching your eyes, nose, or mouth with unwashed hands.  . Avoid close contact with people who are sick. . Avoid places or events with large numbers of people in one location, like concerts or sporting events. . Carefully consider travel plans you have or are making. . If you are planning any travel outside or inside the [place], visit the CDC's Travelers' Health webpage for the  latest health notices. . If you have some symptoms but not all symptoms, continue to monitor at home and seek medical attention if your symptoms worsen. . If you are having a medical emergency, call 911.  HOME CARE . Only take medications as instructed by your medical team. . Drink plenty of fluids and get plenty of rest. . A steam or ultrasonic humidifier can help if you have congestion.   GET HELP RIGHT AWAY IF: . You develop worsening fever. . You become short of breath . You cough up blood. . Your symptoms become more severe MAKE SURE YOU   Understand these instructions.  Will watch your condition.  Will get help right away if you are not doing well or get worse.  Your e-visit answers were reviewed by a board certified advanced clinical practitioner to complete your personal care plan.  Depending on the condition, your plan could have  included both over the counter or prescription medications.  If there is a problem please reply once you have received a response from your provider. Your safety is important to [place].  If you have drug allergies check your prescription carefully.    You can use MyChart to ask questions about today's visit, request a non-urgent call back, or ask for a work or school excuse for 24 hours related to this e-Visit. If it has been greater than 24 hours you will need to follow up with your provider, or enter a new e-Visit to address those concerns. You will get an e-mail in the next two days asking about your experience.  I hope that your e-visit has been valuable and will speed your recovery. Thank you for using e-visits.

## 2018-12-01 ENCOUNTER — Telehealth: Payer: Self-pay | Admitting: Family Medicine

## 2018-12-01 DIAGNOSIS — E7800 Pure hypercholesterolemia, unspecified: Secondary | ICD-10-CM

## 2018-12-01 DIAGNOSIS — R7303 Prediabetes: Secondary | ICD-10-CM

## 2018-12-01 DIAGNOSIS — E039 Hypothyroidism, unspecified: Secondary | ICD-10-CM

## 2018-12-01 DIAGNOSIS — I10 Essential (primary) hypertension: Secondary | ICD-10-CM

## 2018-12-01 DIAGNOSIS — E559 Vitamin D deficiency, unspecified: Secondary | ICD-10-CM

## 2018-12-01 DIAGNOSIS — K760 Fatty (change of) liver, not elsewhere classified: Secondary | ICD-10-CM

## 2018-12-01 NOTE — Telephone Encounter (Signed)
-----   Message from Ellamae Sia sent at 12/01/2018  4:00 PM EDT ----- Regarding: Lab orders for Thursday, 4.16.20 Lab orders, thanks

## 2018-12-03 ENCOUNTER — Ambulatory Visit (INDEPENDENT_AMBULATORY_CARE_PROVIDER_SITE_OTHER): Payer: Medicare Other | Admitting: *Deleted

## 2018-12-03 ENCOUNTER — Other Ambulatory Visit: Payer: Self-pay

## 2018-12-03 ENCOUNTER — Ambulatory Visit (INDEPENDENT_AMBULATORY_CARE_PROVIDER_SITE_OTHER): Payer: Medicare Other

## 2018-12-03 DIAGNOSIS — I472 Ventricular tachycardia, unspecified: Secondary | ICD-10-CM

## 2018-12-03 DIAGNOSIS — I5042 Chronic combined systolic (congestive) and diastolic (congestive) heart failure: Secondary | ICD-10-CM

## 2018-12-03 DIAGNOSIS — Z0000 Encounter for general adult medical examination without abnormal findings: Secondary | ICD-10-CM

## 2018-12-03 LAB — CUP PACEART REMOTE DEVICE CHECK
Battery Remaining Longevity: 54 mo
Battery Remaining Percentage: 57 %
Brady Statistic RV Percent Paced: 0 %
Date Time Interrogation Session: 20200415070200
HighPow Impedance: 66 Ohm
Implantable Lead Implant Date: 20050603
Implantable Lead Location: 753860
Implantable Lead Model: 185
Implantable Lead Serial Number: 116340
Implantable Pulse Generator Implant Date: 20110505
Lead Channel Impedance Value: 638 Ohm
Lead Channel Pacing Threshold Amplitude: 0.7 V
Lead Channel Pacing Threshold Pulse Width: 0.4 ms
Lead Channel Setting Pacing Amplitude: 2.4 V
Lead Channel Setting Pacing Pulse Width: 0.4 ms
Lead Channel Setting Sensing Sensitivity: 0.4 mV
Pulse Gen Serial Number: 266301

## 2018-12-04 ENCOUNTER — Other Ambulatory Visit (INDEPENDENT_AMBULATORY_CARE_PROVIDER_SITE_OTHER): Payer: Medicare Other

## 2018-12-04 ENCOUNTER — Ambulatory Visit: Payer: Medicare Other

## 2018-12-04 DIAGNOSIS — R7303 Prediabetes: Secondary | ICD-10-CM | POA: Diagnosis not present

## 2018-12-04 DIAGNOSIS — E039 Hypothyroidism, unspecified: Secondary | ICD-10-CM | POA: Diagnosis not present

## 2018-12-04 DIAGNOSIS — E7800 Pure hypercholesterolemia, unspecified: Secondary | ICD-10-CM | POA: Diagnosis not present

## 2018-12-04 DIAGNOSIS — E559 Vitamin D deficiency, unspecified: Secondary | ICD-10-CM

## 2018-12-04 DIAGNOSIS — K760 Fatty (change of) liver, not elsewhere classified: Secondary | ICD-10-CM | POA: Diagnosis not present

## 2018-12-04 DIAGNOSIS — I10 Essential (primary) hypertension: Secondary | ICD-10-CM

## 2018-12-04 LAB — LIPID PANEL
Cholesterol: 170 mg/dL (ref 0–200)
HDL: 44 mg/dL (ref >39.00)
LDL Cholesterol: 91 mg/dL (ref 0–99)
NonHDL: 125.58
Total CHOL/HDL Ratio: 4
Triglycerides: 175 mg/dL — ABNORMAL HIGH (ref 0.0–149.0)
VLDL: 35 mg/dL (ref 0.0–40.0)

## 2018-12-04 LAB — COMPREHENSIVE METABOLIC PANEL
ALT: 38 U/L — ABNORMAL HIGH (ref 0–35)
AST: 33 U/L (ref 0–37)
Albumin: 4 g/dL (ref 3.5–5.2)
Alkaline Phosphatase: 97 U/L (ref 39–117)
BUN: 11 mg/dL (ref 6–23)
CO2: 28 mEq/L (ref 19–32)
Calcium: 9.7 mg/dL (ref 8.4–10.5)
Chloride: 106 mEq/L (ref 96–112)
Creatinine, Ser: 0.76 mg/dL (ref 0.40–1.20)
GFR: 90.99 mL/min (ref >60.00)
Glucose, Bld: 101 mg/dL — ABNORMAL HIGH (ref 70–99)
Potassium: 4 mEq/L (ref 3.5–5.1)
Sodium: 140 mEq/L (ref 135–145)
Total Bilirubin: 0.3 mg/dL (ref 0.2–1.2)
Total Protein: 7 g/dL (ref 6.0–8.3)

## 2018-12-04 LAB — CBC WITH DIFFERENTIAL/PLATELET
Basophils Absolute: 0 10*3/uL (ref 0.0–0.1)
Basophils Relative: 0.9 % (ref 0.0–3.0)
Eosinophils Absolute: 0.2 10*3/uL (ref 0.0–0.7)
Eosinophils Relative: 5.3 % — ABNORMAL HIGH (ref 0.0–5.0)
HCT: 37.2 % (ref 36.0–46.0)
Hemoglobin: 12.3 g/dL (ref 12.0–15.0)
Lymphocytes Relative: 27.5 % (ref 12.0–46.0)
Lymphs Abs: 0.9 10*3/uL (ref 0.7–4.0)
MCHC: 33.1 g/dL (ref 30.0–36.0)
MCV: 89.7 fl (ref 78.0–100.0)
Monocytes Absolute: 0.3 10*3/uL (ref 0.1–1.0)
Monocytes Relative: 10 % (ref 3.0–12.0)
Neutro Abs: 1.8 10*3/uL (ref 1.4–7.7)
Neutrophils Relative %: 56.3 % (ref 43.0–77.0)
Platelets: 178 10*3/uL (ref 150.0–400.0)
RBC: 4.15 Mil/uL (ref 3.87–5.11)
RDW: 12.7 % (ref 11.5–15.5)
WBC: 3.3 10*3/uL — ABNORMAL LOW (ref 4.0–10.5)

## 2018-12-04 LAB — HEMOGLOBIN A1C: Hgb A1c MFr Bld: 5.4 % (ref 4.6–6.5)

## 2018-12-04 LAB — VITAMIN D 25 HYDROXY (VIT D DEFICIENCY, FRACTURES): VITD: 50.22 ng/mL (ref 30.00–100.00)

## 2018-12-04 LAB — TSH: TSH: 4.05 u[IU]/mL (ref 0.35–4.50)

## 2018-12-04 NOTE — Progress Notes (Signed)
Subjective:   [name] is a 68 y.o. female who presents for Medicare Annual (Subsequent) preventive examination.  Review of Systems:  N/A Cardiac Risk Factors include: advanced age (>10men, >25 women);obesity (BMI >30kg/m2);dyslipidemia;hypertension     Objective:     Vitals: LMP  (LMP Unknown)   There is no height or weight on file to calculate BMI.  Advanced Directives 12/04/2018 01/06/2018 11/27/2017 11/26/2016 02/13/2016 01/19/2016 12/21/2015  Does Patient Have a Medical Advance Directive? Yes Yes Yes Yes No Yes Yes  Type of Paramedic of [place];Living will Healthcare Power of [place];Living will [place];Living will - - [place];Living will  Does patient want to make changes to medical advance directive? - - - - - - No - Patient declined  Copy of [place] in Chart? No - copy requested - No - copy requested No - copy requested - Yes No - copy requested  Would patient like information on creating a medical advance directive? - - - - No - patient declined information - -    Tobacco Social History   Tobacco Use  Smoking Status Former Smoker  . Packs/day: 1.00  . Years: 15.00  . Pack years: 15.00  . Types: Cigarettes  . Last attempt to quit: 08/20/1989  . Years since quitting: 29.3  Smokeless Tobacco Never Used  Tobacco Comment   Started at 26; less than 1 PPD     Counseling given: No Comment: Started at 79; less than 1 PPD   Clinical Intake:  Pre-visit preparation completed: Yes  Pain : 0-10 Pain Score: 2  Pain Type: Chronic pain Pain Location: Abdomen     Nutritional Risks: None  How often do you need to have someone help you when you read instructions, pamphlets, or other written materials from your doctor or pharmacy?: 1 - Never What is the last grade level you completed in school?: Associate degree  Interpreter Needed?: No  Comments: pt lives  independently Information entered by :: LPinson, LPN  Past Medical History:  Diagnosis Date  . AICD (automatic cardioverter/defibrillator) present   . Anxiety   . Arthritis   . 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 Cerebral aneurysm, nonruptured    had a clip put in  . Clostridium difficile infection   . Depressive disorder, not elsewhere classified   . Diverticulosis of colon (without mention of hemorrhage)   . Esophageal reflux   . Fibromyalgia   . Gastritis   . GERD (gastroesophageal reflux disease)   . GI bleed 2004  . Hiatal hernia   . Hyperlipidemia   . Hypertension   . Hypothyroidism   . Internal hemorrhoids   . Obstructive sleep apnea (adult) (pediatric)   . Ostium secundum type atrial septal defect   . Other chronic nonalcoholic liver disease   . Other pulmonary embolism and infarction   . Paroxysmal ventricular tachycardia ([place])   . Personal history of radiation therapy   . Pneumonia    history  . Presence of permanent cardiac pacemaker   . PUD (peptic ulcer disease)   . Radiation 02/03/15-03/10/15   Right Breast  . Sarcoid    per pt , not sure  . Schatzki's ring   . Stroke [facility]) 2013   tia/ pt feels it was around 2008 0r 2009  . Takotsubo syndrome   . Tubular adenoma of colon   . Unspecified  transient cerebral ischemia   . Unspecified vitamin D deficiency    Past Surgical History:  Procedure Laterality Date  . ABDOMINAL HYSTERECTOMY    . ABI  2006   normal  . BREAST EXCISIONAL BIOPSY    . BREAST LUMPECTOMY     right breast 2016  . BREAST LUMPECTOMY WITH RADIOACTIVE SEED LOCALIZATION Right 01/03/2015   Procedure: BREAST LUMPECTOMY WITH RADIOACTIVE SEED LOCALIZATION;  Surgeon: Autumn Messing III, MD;  Location: [place];  Service: General;  Laterality: Right;  . CARDIAC DEFIBRILLATOR PLACEMENT  2006; 2012   BSX single chamber ICD  . carotid dopplers  2006   neg  . CEREBRAL ANEURYSM REPAIR  02/1999  . COLONOSCOPY    .  hospitalization  2004   GI bleed, PUD, diverticulosis (EGD,colonscopy)  . hospitalization     PE, NSVT, s/p defib  . JOINT REPLACEMENT  2017   left knee   . KNEE ARTHROSCOPY     bilateral  . LEFT HEART CATHETERIZATION WITH CORONARY ANGIOGRAM N/A 02/22/2012   Procedure: LEFT HEART CATHETERIZATION WITH CORONARY ANGIOGRAM;  Surgeon: Burnell Blanks, MD;  Location[facility] C[facility] LAB;  Service: Cardiovascular;  Laterality: N/A;  . PARTIAL HYSTERECTOMY     Fibroids  . TONGUE BIOPSY  12/12/2017   due to sore tongue and white patches/abnormal cells  . TOTAL KNEE ARTHROPLASTY Left 02/13/2016   Procedure: TOTAL KNEE ARTHROPLASTY;  Surgeon: Vickey Huger, MD;  Location: [place];  Service: Orthopedics;  Laterality: Left;  . UPPER GASTROINTESTINAL ENDOSCOPY     Family History  Problem Relation Age of Onset  . Diabetes Mother   . Alzheimer's disease Mother   . Other Brother        Thyroid problem 10/2016  . Allergies Sister   . Heart disease Sister   . Heart disease Brother   . Prostate cancer Brother 61       same brother as throat cancer  . Throat cancer Brother        dx in his 58s; also a smoker  . Heart disease Father   . Cancer Maternal Grandmother 49       colon cancer or abdominal cancer  . Lung cancer Maternal Grandfather 46  . Breast cancer Maternal Aunt 4  . Colon cancer Maternal Aunt 37       same sister as breast at 77  . Lung cancer Maternal Uncle   . Breast cancer Maternal Aunt        dx in her 84s  . Pancreatic cancer Cousin 59       maternal first cousin  . Breast cancer Cousin        paternal first cousin twice removed died in her 70s  . Cardiomyopathy Other        Family history  . Heart disease Son        Cardiac Arrest 07/2016  . Stroke Neg Hx    Social History   Socioeconomic History  . Marital status: Divorced    Spouse name: Not on file  . Number of children: 2  . Years of education: 57  . Highest education level: Not on file  Occupational History  .  Occupation: DISABLED  Social Needs  . Financial resource strain: Not on file  . Food insecurity:    Worry: Not on file    Inability: Not on file  . Transportation needs:    Medical: Not on file    Non-medical: Not on file  Tobacco Use  .  Smoking status: Former Smoker    Packs/day: 1.00    Years: 15.00    Pack years: 15.00    Types: Cigarettes    Last attempt to quit: 08/20/1989    Years since quitting: 29.3  . Smokeless tobacco: Never Used  . Tobacco comment: Started at 79; less than 1 PPD  Substance and Sexual Activity  . Alcohol use: Yes    Alcohol/week: 0.0 standard drinks    Comment: rare  . Drug use: No  . Sexual activity: Not Currently  Lifestyle  . Physical activity:    Days per week: Not on file    Minutes per session: Not on file  . Stress: Not on file  Relationships  . Social connections:    Talks on phone: Not on file    Gets together: Not on file    Attends religious service: Not on file    Active member of club or organization: Not on file    Attends meetings of clubs or organizations: Not on file    Relationship status: Not on file  Other Topics Concern  . Not on file  Social History Narrative   Lives alone   Caffeine GEX:BMWU   Retired from [employer]   2 children   Divorced   Right handed     Outpatient Encounter Medications as of 12/03/2018  Medication Sig  . acetaminophen (TYLENOL) 500 MG tablet Take 500 mg by mouth every 6 (six) hours as needed (pain).   [name] albuterol (PROVENTIL) (2.5 MG/3ML) 0.083% nebulizer solution Take 3 mLs (2.5 mg total) by nebulization every 6 (six) hours as needed for wheezing or shortness of breath.  [name] albuterol (VENTOLIN HFA) 108 (90 Base) MCG/ACT inhaler Inhale 2 puffs into the lungs every 6 (six) hours as needed for wheezing or shortness of breath.  [name] aspirin 81 MG tablet Take 81 mg by mouth daily.  . baclofen (LIORESAL) 10 MG tablet TAKE 1 TABLET THREE TIMES DAILY (Patient taking differently: 10 mg. 1 daily)  .  calcium carbonate (OSCAL) 1500 (600 Ca) MG TABS tablet Take 600 mg of elemental calcium by mouth once a week.   . carvedilol (COREG) 12.5 MG tablet Take 1 tablet (12.5 mg total) by mouth 2 (two) times daily.  . cholecalciferol (VITAMIN D) 1000 UNITS tablet Take 5,000 Units by mouth daily.   . fexofenadine (ALLEGRA) 180 MG tablet Take 180 mg by mouth daily as needed for allergies or rhinitis.  . fluticasone (FLONASE) 50 MCG/ACT nasal spray Place 2 sprays into both nostrils daily as needed for allergies or rhinitis.  . furosemide (LASIX) 20 MG tablet Take 1 tablet (20 mg total) by mouth daily.  . hydroxypropyl methylcellulose / hypromellose (ISOPTO TEARS / GONIOVISC) 2.5 % ophthalmic solution Place 1 drop into both eyes daily.  [name] latanoprost (XALATAN) 0.005 % ophthalmic solution Place 1 drop into both eyes at bedtime.  [name] levothyroxine (SYNTHROID, LEVOTHROID) 50 MCG tablet Take 1 tablet (50 mcg total) by mouth daily.  . mometasone (ELOCON) 0.1 % lotion Apply 1 application topically daily as needed (rash).   . montelukast (SINGULAIR) 10 MG tablet Take 1 tablet (10 mg total) by mouth at bedtime.  . Multiple Vitamins-Minerals (MULTIVITAMIN PO) Take 1 tablet by mouth daily.  . pantoprazole (PROTONIX) 40 MG tablet Take 1 tablet (40 mg total) by mouth 2 (two) times daily before a meal.  . polyethylene glycol powder (GLYCOLAX/MIRALAX) powder Take 17 grams by mouth daily  . potassium chloride (K-DUR) 10 MEQ tablet  TAKE 1/2 TABLET (5MEQ TOTAL) EVERY DAY (Patient taking differently: Take 5 mEq by mouth daily. TAKE 1/2 TABLET (5MEQ TOTAL) EVERY DAY)  . Probiotic Product (PROBIOTIC-10 PO) Take 1 capsule by mouth daily.   [name] Respiratory Therapy Supplies (FLUTTER) DEVI 1 Device by Does not apply route as needed.  . simvastatin (ZOCOR) 40 MG tablet TAKE 1/2 TABLET  (20  MG  TOTAL) EVERY DAY  . traMADol (ULTRAM) 50 MG tablet Take 1 tablet (50 mg total) by mouth at bedtime.  [name] zolpidem (AMBIEN) 10 MG tablet TAKE 1  TABLET BY MOUTH AT BEDTIME AS NEEDED FOR SLEEP  . DULoxetine (CYMBALTA) 20 MG capsule Take 1 capsule (20 mg total) by mouth daily. (Patient not taking: Reported on 12/03/2018)   No facility-administered encounter medications on file as of 12/03/2018.     Activities of Daily Living In your present state of health, do you have any difficulty performing the following activities: 12/04/2018  Hearing? N  Vision? N  Difficulty concentrating or making decisions? N  Walking or climbing stairs? Y  Dressing or bathing? N  Doing errands, shopping? N  Preparing Food and eating ? N  Using the Toilet? N  In the past six months, have you accidently leaked urine? N  Do you have problems with loss of bowel control? N  Managing your Medications? N  Managing your Finances? N  Housekeeping or managing your Housekeeping? N  Some recent data might be hidden    Patient Care Team: Tower, [name], MD as PCP - General Jovita Kussmaul, MD as Consulting Physician (General Surgery) Truitt Merle, MD as Consulting Physician (Hematology) Thea Silversmith, MD as Consulting Physician (Radiation Oncology) Rockwell Germany, RN as Registered Nurse Mauro Kaufmann, RN as Registered Nurse Deboraha Sprang, MD as Consulting Physician (Cardiology) Holley Bouche, NP (Inactive) as Nurse Practitioner (Nurse Practitioner) Sylvan Cheese, NP as Nurse Practitioner (Nurse Practitioner) Chesley Mires, MD as Consulting Physician (Pulmonary Disease) Jovita Kussmaul, MD as Consulting Physician (General Surgery) Bo Merino, MD as Consulting Physician (Rheumatology)    Assessment:   This is a routine wellness examination for [place].  Vision Screening Comments: Vision exam in March 2019   Exercise Activities and Dietary recommendations Current Exercise Habits: The patient does not participate in regular exercise at present, Exercise limited by: None identified  Goals    . Weight < 176 lb (79.833 kg)     Starting  12/04/2018, I will attempt to reduce all added sugar in diet and increase exercise for up to 90 min 3 days per week.        Fall Risk Fall Risk  12/04/2018 11/27/2017 11/26/2016 11/22/2015 01/19/2015  Falls in the past year? 0 No No Yes Yes  Number falls in past yr: - - - 1 1  Comment - - - fell in yard; was running from a wasp; broke glasses; injury to head, shoulder, and hip -  Injury with Fall? - - - Yes No  Risk for fall due to : - - - Impaired balance/gait;Impaired mobility Other (Comment)  Risk for fall due to: Comment - - - - running fro wasp, tripped and fell in yard  Follow up - - - Falls evaluation completed;Education provided Falls prevention discussed   Depression Screen PHQ 2/9 Scores 12/04/2018 12/17/2017 11/27/2017 11/26/2016  PHQ - 2 Score 2 3 0 0  PHQ- 9 Score 2 13 0 -     Cognitive Function MMSE - Mini Mental State Exam  12/04/2018 11/27/2017 11/26/2016 11/22/2015  Not completed: Unable to complete - - -  Orientation to time - 5 5 5   Orientation to Place - 5 5 5   Registration - 3 3 3   Attention/ Calculation - 0 0 0  Recall - 3 3 3   Language- name 2 objects - 0 0 0  Language- repeat - 1 1 1   Language- follow 3 step command - 3 3 3   Language- read & follow direction - 0 0 0  Write a sentence - 0 0 0  Copy design - 0 0 0  Total score - 20 20 20         Immunization History  Administered Date(s) Administered  . H1N1 07/24/2008  . Influenza Split 07/17/2011, 09/27/2011, 04/29/2012  . Influenza Whole 07/20/2004, 07/04/2007, 05/07/2008, 05/19/2009, 05/17/2010  . Influenza, High Dose Seasonal PF 05/30/2017, 05/21/2018  . Influenza,inj,Quad PF,6+ Mos 05/20/2013, 05/06/2014, 05/17/2015, 05/15/2016  . Influenza-Unspecified 05/07/2018  . Pneumococcal Conjugate-13 11/22/2015  . Pneumococcal Polysaccharide-23 05/20/2002, 05/07/2008, 06/25/2014  . Td 09/07/2009  . Zoster 09/21/2011    Screening Tests Health Maintenance  Topic Date Due  . MAMMOGRAM  01/08/2019  . INFLUENZA  VACCINE  03/21/2019  . TETANUS/TDAP  09/08/2019  . COLONOSCOPY  01/07/2023  . DEXA SCAN  Completed  . Hepatitis C Screening  Completed  . PNA vac Low Risk Adult  Completed      Plan:     I have personally reviewed, addressed, and noted the following in the patient's chart:  A. Medical and social history B. Use of alcohol, tobacco or illicit drugs  C. Current medications and supplements D. Functional ability and status E.  Nutritional status F.  Physical activity G. Advance directives H. List of other physicians I.  Hospitalizations, surgeries, and ER visits in previous 12 months J.  [place] to include hearing, vision, cognitive, depression L. Referrals and appointments - none  In addition, I have reviewed and discussed with patient certain preventive protocols, quality metrics, and best practice recommendations. A written personalized care plan for preventive services as well as general preventive health recommendations were provided to patient.  See attached scanned questionnaire for additional information.   Signed,   Lindell Noe, MHA, BS, LPN Health Coach

## 2018-12-04 NOTE — Progress Notes (Signed)
PCP notes:   Health maintenance:  No gaps identified.   Abnormal screenings:   Depression score: 2 Depression screen [facility] 2/9 12/04/2018 12/17/2017 11/27/2017 11/26/2016 11/22/2015  Decreased Interest 2 2 0 0 0  Down, Depressed, Hopeless 0 1 0 0 0  PHQ - 2 Score 2 3 0 0 0  Altered sleeping 0 0 0 - -  Tired, decreased energy 0 - 0 - -  Change in appetite 0 3 0 - -  Feeling bad or failure about yourself  0 2 0 - -  Trouble concentrating 0 3 0 - -  Moving slowly or fidgety/restless 0 2 0 - -  Suicidal thoughts 0 0 0 - -  PHQ-9 Score 2 13 0 - -  Difficult doing work/chores Not difficult at all - Not difficult at all - -  Some recent data might be hidden   Patient concerns:   None  Nurse concerns:  None  Next PCP appt:   12/09/18 @ 0930  I reviewed health advisor's note, was available for consultation, and agree with documentation and plan. Loura Pardon MD

## 2018-12-04 NOTE — Progress Notes (Signed)
Virtual Visit via Video Note  I connected with [name] on 12/04/18 at 11:00 AM EDT by a video enabled telemedicine application and verified that I am speaking with the correct person using two identifiers. Patient in home; Probation officer in office. Patient agreed to telemedicine appointment.   Lindell Noe, LPN

## 2018-12-04 NOTE — Patient Instructions (Signed)
[name] , Thank you for taking time to come for your Medicare Wellness Visit. I appreciate your ongoing commitment to your health goals. Please review the following plan we discussed and let me know if I can assist you in the future.   These are the goals we discussed: Goals    . Weight < 176 lb (79.833 kg)     Starting 12/04/2018, I will attempt to reduce all added sugar in diet and increase exercise for up to 90 min 3 days per week.        This is a list of the screening recommended for you and due dates:  Health Maintenance  Topic Date Due  . Mammogram  01/08/2019  . Flu Shot  03/21/2019  . Tetanus Vaccine  09/08/2019  . Colon Cancer Screening  01/07/2023  . DEXA scan (bone density measurement)  Completed  .  Hepatitis C: One time screening is recommended by Center for Disease Control  (CDC) for  adults born from [birth year] through 1967.   Completed  . Pneumonia vaccines  Completed   Preventive Care for Adults  A healthy lifestyle and preventive care can promote health and wellness. Preventive health guidelines for adults include the following key practices.  . A routine yearly physical is a good way to check with your health care provider about your health and preventive screening. It is a chance to share any concerns and updates on your health and to receive a thorough exam.  . Visit your dentist for a routine exam and preventive care every 6 months. Brush your teeth twice a day and floss once a day. Good oral hygiene prevents tooth decay and gum disease.  . The frequency of eye exams is based on your age, health, family medical history, use  of contact lenses, and other factors. Follow your health care provider's recommendations for frequency of eye exams.  . Eat a healthy diet. Foods like vegetables, fruits, whole grains, low-fat dairy products, and lean protein foods contain the nutrients you need without too many calories. Decrease your intake of foods high in solid fats, added  sugars, and salt. Eat the right amount of calories for you. Get information about a proper diet from your health care provider, if necessary.  . Regular physical exercise is one of the most important things you can do for your health. Most adults should get at least 150 minutes of moderate-intensity exercise (any activity that increases your heart rate and causes you to sweat) each week. In addition, most adults need muscle-strengthening exercises on 2 or more days a week.  Silver Sneakers may be a benefit available to you. To determine eligibility, you may visit the website: www.silversneakers.com or contact program at [phone])677-4601 Mon-Fri between 8AM-8PM.   . Maintain a healthy weight. The body mass index (BMI) is a screening tool to identify possible weight problems. It provides an estimate of body fat based on height and weight. Your health care provider can find your BMI and can help you achieve or maintain a healthy weight.   For adults 20 years and older: ? A BMI below 18.5 is considered underweight. ? A BMI of 18.5 to 24.9 is normal. ? A BMI of 25 to 29.9 is considered overweight. ? A BMI of 30 and above is considered obese.   . Maintain normal blood lipids and cholesterol levels by exercising and minimizing your intake of saturated fat. Eat a balanced diet with plenty of fruit and vegetables. Blood tests for lipids  and cholesterol should begin at age 38 and be repeated every 5 years. If your lipid or cholesterol levels are high, you are over 50, or you are at high risk for heart disease, you may need your cholesterol levels checked more frequently. Ongoing high lipid and cholesterol levels should be treated with medicines if diet and exercise are not working.  . If you smoke, find out from your health care provider how to quit. If you do not use tobacco, please do not start.  . If you choose to drink alcohol, please do not consume more than 2 drinks per day. One drink is considered to  be 12 ounces (355 mL) of beer, 5 ounces (148 mL) of wine, or 1.5 ounces (44 mL) of liquor.  . If you are 45-61 years old, ask your health care provider if you should take aspirin to prevent strokes.  . Use sunscreen. Apply sunscreen liberally and repeatedly throughout the day. You should seek shade when your shadow is shorter than you. Protect yourself by wearing long sleeves, pants, a wide-brimmed hat, and sunglasses year round, whenever you are outdoors.  . Once a month, do a whole body skin exam, using a mirror to look at the skin on your back. Tell your health care provider of new moles, moles that have irregular borders, moles that are larger than a pencil eraser, or moles that have changed in shape or color.

## 2018-12-09 ENCOUNTER — Ambulatory Visit (INDEPENDENT_AMBULATORY_CARE_PROVIDER_SITE_OTHER): Payer: Medicare Other | Admitting: Family Medicine

## 2018-12-09 ENCOUNTER — Other Ambulatory Visit: Payer: Self-pay | Admitting: Family Medicine

## 2018-12-09 ENCOUNTER — Encounter: Payer: Self-pay | Admitting: Family Medicine

## 2018-12-09 VITALS — Wt 220.0 lb

## 2018-12-09 DIAGNOSIS — I428 Other cardiomyopathies: Secondary | ICD-10-CM

## 2018-12-09 DIAGNOSIS — E039 Hypothyroidism, unspecified: Secondary | ICD-10-CM | POA: Diagnosis not present

## 2018-12-09 DIAGNOSIS — K760 Fatty (change of) liver, not elsewhere classified: Secondary | ICD-10-CM

## 2018-12-09 DIAGNOSIS — Z853 Personal history of malignant neoplasm of breast: Secondary | ICD-10-CM

## 2018-12-09 DIAGNOSIS — F418 Other specified anxiety disorders: Secondary | ICD-10-CM

## 2018-12-09 DIAGNOSIS — I10 Essential (primary) hypertension: Secondary | ICD-10-CM | POA: Diagnosis not present

## 2018-12-09 DIAGNOSIS — J471 Bronchiectasis with (acute) exacerbation: Secondary | ICD-10-CM

## 2018-12-09 DIAGNOSIS — C50411 Malignant neoplasm of upper-outer quadrant of right female breast: Secondary | ICD-10-CM | POA: Diagnosis not present

## 2018-12-09 DIAGNOSIS — E7800 Pure hypercholesterolemia, unspecified: Secondary | ICD-10-CM

## 2018-12-09 DIAGNOSIS — R7303 Prediabetes: Secondary | ICD-10-CM

## 2018-12-09 DIAGNOSIS — Z170 Estrogen receptor positive status [ER+]: Secondary | ICD-10-CM

## 2018-12-09 MED ORDER — LEVOTHYROXINE SODIUM 50 MCG PO TABS: 50.0000 ug | ORAL_TABLET | Freq: Every day | ORAL | 3 refills | Status: DC

## 2018-12-09 MED ORDER — FLUTICASONE PROPIONATE 50 MCG/ACT NA SUSP: 2.0000 | Freq: Every day | NASAL | 3 refills | Status: DC | PRN

## 2018-12-09 MED ORDER — POTASSIUM CHLORIDE ER 10 MEQ PO TBCR: EXTENDED_RELEASE_TABLET | ORAL | 3 refills | Status: DC

## 2018-12-09 NOTE — Assessment & Plan Note (Signed)
Pt continues to be frustrated with her sob Plans to touch base with pulmonary

## 2018-12-09 NOTE — Progress Notes (Signed)
Virtual Visit via Video Note  I connected with [name] on 12/09/18 at  9:30 AM EDT by a video enabled telemedicine application and verified that I am speaking with the correct person using two identifiers. The patient is in her home today  I am in my office at [place]    I discussed the limitations of evaluation and management by telemedicine and the availability of in person appointments. The patient expressed understanding and agreed to proceed.  History of Present Illness: Here for f/u of chronic health problems and medication refills   She tried the cymbalta Made her sick /sleepy/lethargic  Would rather not see a psychiatrist at this time   Otherwise she has been doing pretty well  Using inhaler for bronchiectasis from pulmonary  Not helping a lot  She plans to call to d/w her pulmonology   Past hx of breast cancer  Also has a hernia that is bothering her - sees Dr [name]  Wears an abd binder  Had been lifting   Wt Readings from Last 3 Encounters:  10/16/18 220 lb (99.8 kg)  09/22/18 225 lb (102.1 kg)  09/19/18 224 lb 3.2 oz (101.7 kg)  at home her wt is the same 220     bp is stable today  No cp or palpitations or headaches or edema  No side effects to medicines  BP Readings from Last 3 Encounters:  10/16/18 100/70  09/22/18 122/80  09/19/18 120/70    140/80 at home when she was feeling nervous Pulse ox 96%   Lab Results  Component Value Date   CREATININE 0.76 12/04/2018   BUN 11 12/04/2018   NA 140 12/04/2018   K 4.0 12/04/2018   CL 106 12/04/2018   CO2 28 12/04/2018   Lab Results  Component Value Date   ALT 38 (H) 12/04/2018   AST 33 12/04/2018   ALKPHOS 97 12/04/2018   BILITOT 0.3 12/04/2018    ALT is stable  Eating better since she is home  Eating veg and balanced meals  Not enough exercise  She is constantly moving  Gets out when she can    Hypothyroidism  Pt has no clinical changes No change in energy level/ hair or skin/  edema and no tremor Lab Results  Component Value Date   TSH 4.05 12/04/2018      hyperlipidemia Lab Results  Component Value Date   CHOL 170 12/04/2018   CHOL 159 11/27/2017   CHOL 172 11/26/2016   Lab Results  Component Value Date   HDL 44.00 12/04/2018   HDL 50.10 11/27/2017   HDL 48.50 11/26/2016   Lab Results  Component Value Date   LDLCALC 91 12/04/2018   LDLCALC 84 11/27/2017   LDLCALC 96 11/26/2016   Lab Results  Component Value Date   TRIG 175.0 (H) 12/04/2018   TRIG 123.0 11/27/2017   TRIG 135.0 11/26/2016   Lab Results  Component Value Date   CHOLHDL 4 12/04/2018   CHOLHDL 3 11/27/2017   CHOLHDL 4 11/26/2016   Lab Results  Component Value Date   LDLDIRECT 141.8 07/04/2007    She was off sugar for a month   Prediabetes Lab Results  Component Value Date   HGBA1C 5.4 12/04/2018   Lab Results  Component Value Date   WBC 3.3 (L) 12/04/2018   HGB 12.3 12/04/2018   HCT 37.2 12/04/2018   MCV 89.7 12/04/2018   PLT 178.0 12/04/2018    Patient Active Problem  List   Diagnosis Date Noted  . Pre-op exam 01/21/2018  . Neck pain 10/25/2017  . Paresthesias in left hand 10/25/2017  . Paresthesia of foot, bilateral 04/12/2017  . Primary osteoarthritis of both hands 09/27/2016  . Primary osteoarthritis of both knees 09/27/2016  . Urinary frequency 09/26/2016  . Screen for STD (sexually transmitted disease) 09/26/2016  . 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  . Nasopharyngitis, 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/2016  . Postnasal drip 05/18/2016  . S/P total knee replacement 02/13/2016  . Chronic pain of both knees 12/06/2015  . Globus pharyngeus 05/17/2015  . Productive cough 05/17/2015  . Genetic testing 12/23/2014  . Family history of breast cancer   . Family history of colon cancer   . Family history of pancreatic cancer   . Breast cancer of upper-outer quadrant of right female breast ([place]) 11/23/2014  . Allergy to ACE inhibitors 09/27/2014  . Encounter for  Medicare annual wellness exam 06/27/2014  . Prediabetes 06/23/2013  . Routine general medical examination at a health care facility 06/23/2013  . RLS (restless legs syndrome) 01/15/2013  . Hx of Clostridium difficile infection 10/10/2012  . Bronchiectasis with acute exacerbation ([place]) 04/29/2012  . Dyspnea on exertion 04/02/2012  . Non-ischemic cardiomyopathy ([place]) 03/07/2012  . Colon cancer screening 01/11/2012  . Post-menopausal 01/11/2012  . Chest pain 11/07/2011  . Palpitations 04/17/2011  . Abnormal tympanic membrane 01/22/2011  . Obstructive sleep apnea 04/04/2010  . VENTRICULAR TACHYCARDIA 01/03/2009  . ICD  [employer]  Single chamber 01/03/2009  . PATENT FORAMEN OVALE 09/29/2008  . Vitamin D deficiency 08/17/2008  . Hypothyroidism 11/18/2006  . Hyperlipidemia 11/18/2006  . Depression with anxiety 11/18/2006  . Essential hypertension 11/18/2006  . MITRAL VALVE PROLAPSE 11/18/2006  . CEREBRAL ANEURYSM 11/18/2006  . Allergic rhinitis 11/18/2006  . GERD 11/18/2006  . DIVERTICULOSIS, COLON 11/18/2006  . Fatty liver 11/18/2006  . Fibromyalgia 11/18/2006   Past Medical History:  Diagnosis Date  . AICD (automatic cardioverter/defibrillator) present   . Anxiety   . Arthritis   . 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 Cerebral aneurysm, nonruptured    had a clip put in  . Clostridium difficile infection   . Depressive disorder, not elsewhere classified   . Diverticulosis of colon (without mention of hemorrhage)   . Esophageal reflux   . Fibromyalgia   . Gastritis   . GERD (gastroesophageal reflux disease)   . GI bleed 2004  . Hiatal hernia   . Hyperlipidemia   . Hypertension   . Hypothyroidism   . Internal hemorrhoids   . Obstructive sleep apnea (adult) (pediatric)   . Ostium secundum type atrial septal defect   . Other chronic nonalcoholic liver disease   . Other pulmonary embolism and infarction   . Paroxysmal ventricular  tachycardia ([place])   . Personal history of radiation therapy   . Pneumonia    history  . Presence of permanent cardiac pacemaker   . PUD (peptic ulcer disease)   . Radiation 02/03/15-03/10/15   Right Breast  . Sarcoid    per pt , not sure  . Schatzki's ring   . Stroke [facility]) 2013   tia/ pt feels it was around 2008 0r 2009  . Takotsubo syndrome   . Tubular adenoma of colon   . Unspecified transient cerebral ischemia   . Unspecified vitamin D deficiency    Past Surgical History:  Procedure Laterality Date  . ABDOMINAL HYSTERECTOMY    . ABI  2006   normal  . BREAST EXCISIONAL BIOPSY    . BREAST LUMPECTOMY     right breast 2016  . BREAST LUMPECTOMY WITH RADIOACTIVE SEED LOCALIZATION Right 01/03/2015   Procedure: BREAST LUMPECTOMY WITH RADIOACTIVE SEED LOCALIZATION;  Surgeon: Autumn Messing III, MD;  Location: [facility];  Service: General;  Laterality: Right;  . CARDIAC DEFIBRILLATOR PLACEMENT  2006; 2012   BSX single chamber ICD  . carotid dopplers  2006   neg  . CEREBRAL ANEURYSM REPAIR  02/1999  . COLONOSCOPY    . hospitalization  2004   GI bleed, PUD, diverticulosis (EGD,colonscopy)  . hospitalization     PE, NSVT, s/p defib  . JOINT REPLACEMENT  2017   left knee   . KNEE ARTHROSCOPY     bilateral  . LEFT HEART CATHETERIZATION WITH CORONARY ANGIOGRAM N/A 02/22/2012   Procedure: LEFT HEART CATHETERIZATION WITH CORONARY ANGIOGRAM;  Surgeon: Burnell Blanks, MD;  Location[facility] CATH LAB;  Service: Cardiovascular;  Laterality: N/A;  . PARTIAL HYSTERECTOMY     Fibroids  . TONGUE BIOPSY  12/12/2017   due to sore tongue and white patches/abnormal cells  . TOTAL KNEE ARTHROPLASTY Left 02/13/2016   Procedure: TOTAL KNEE ARTHROPLASTY;  Surgeon: Vickey Huger, MD;  Location: [facility];  Service: Orthopedics;  Laterality: Left;  . UPPER GASTROINTESTINAL ENDOSCOPY     Social History   Tobacco Use  . Smoking status: Former Smoker    Packs/day: 1.00    Years: 15.00    Pack years: 15.00     Types: Cigarettes    Last attempt to quit: 08/20/1989    Years since quitting: 29.3  . Smokeless tobacco: Never Used  . Tobacco comment: Started at 52; less than 1 PPD  Substance Use Topics  . Alcohol use: Yes    Alcohol/week: 0.0 standard drinks    Comment: rare  . Drug use: No   Family History  Problem Relation Age of Onset  . Diabetes Mother   . Alzheimer's disease Mother   . Other Brother        Thyroid problem 10/2016  . Allergies Sister   . Heart disease Sister   . Heart disease Brother   . Prostate cancer Brother 35       same brother as throat cancer  . Throat cancer Brother        dx in his 69s; also a smoker  . Heart disease Father   . Cancer Maternal Grandmother 1       colon cancer or abdominal cancer  . Lung cancer Maternal Grandfather 3  . Breast cancer Maternal Aunt 42  . Colon cancer Maternal Aunt 30       same sister as breast at 48  . Lung cancer Maternal Uncle   . Breast cancer Maternal Aunt        dx in her 71s  . Pancreatic cancer Cousin 28       maternal first cousin  . Breast cancer Cousin        paternal first cousin twice removed died in her 39s  . Cardiomyopathy Other        Family history  . Heart disease Son        Cardiac Arrest 07/2016  . Stroke Neg Hx    Allergies  Allergen Reactions  . Ace Inhibitors Swelling    Angioedema; makes tongue "break out"   . Dilantin [Phenytoin Sodium Extended] Rash    Severe rash  . Atorvastatin Other (See  Comments)    SEVERE MYALGIA  . Ramipril Other (See Comments)    TONGUE ULCERS   . Rosuvastatin Other (See Comments)    SEVERE MYALGIA  . Cymbalta [Duloxetine Hcl]     Sleepiness/ sick  . Iodine-131 Other (See Comments)    Swelling at IV site only, no swelling or SOB around mouth.!  . Paroxetine Nausea Only    Rapid heartbeat  . Wellbutrin [Bupropion]     Palpitations   . Amitriptyline Hcl Other (See Comments)     makes her too sleepy!  . Carbamazepine Rash  . Codeine Itching  .  Phenytoin Sodium Extended Rash   Current Outpatient Medications on File Prior to Visit  Medication Sig Dispense Refill  . acetaminophen (TYLENOL) 500 MG tablet Take 500 mg by mouth every 6 (six) hours as needed (pain).     [name] albuterol (PROVENTIL) (2.5 MG/3ML) 0.083% nebulizer solution Take 3 mLs (2.5 mg total) by nebulization every 6 (six) hours as needed for wheezing or shortness of breath. 75 mL 12  . albuterol (VENTOLIN HFA) 108 (90 Base) MCG/ACT inhaler Inhale 2 puffs into the lungs every 6 (six) hours as needed for wheezing or shortness of breath. 3 Inhaler 3  . aspirin 81 MG tablet Take 81 mg by mouth daily.    . baclofen (LIORESAL) 10 MG tablet TAKE 1 TABLET THREE TIMES DAILY (Patient taking differently: 10 mg. 1 daily) 270 tablet 0  . calcium carbonate (OSCAL) 1500 (600 Ca) MG TABS tablet Take 600 mg of elemental calcium by mouth once a week.     . carvedilol (COREG) 12.5 MG tablet Take 1 tablet (12.5 mg total) by mouth 2 (two) times daily. 90 tablet 3  . cholecalciferol (VITAMIN D) 1000 UNITS tablet Take 5,000 Units by mouth daily.     . fexofenadine (ALLEGRA) 180 MG tablet Take 180 mg by mouth daily as needed for allergies or rhinitis.    . furosemide (LASIX) 20 MG tablet Take 1 tablet (20 mg total) by mouth daily. 90 tablet 3  . hydroxypropyl methylcellulose / hypromellose (ISOPTO TEARS / GONIOVISC) 2.5 % ophthalmic solution Place 1 drop into both eyes daily.    [name] latanoprost (XALATAN) 0.005 % ophthalmic solution Place 1 drop into both eyes at bedtime.    . mometasone (ELOCON) 0.1 % lotion Apply 1 application topically daily as needed (rash).     . montelukast (SINGULAIR) 10 MG tablet Take 1 tablet (10 mg total) by mouth at bedtime. 90 tablet 3  . Multiple Vitamins-Minerals (MULTIVITAMIN PO) Take 1 tablet by mouth daily.    . pantoprazole (PROTONIX) 40 MG tablet Take 1 tablet (40 mg total) by mouth 2 (two) times daily before a meal. 180 tablet 2  . polyethylene glycol powder  (GLYCOLAX/MIRALAX) powder Take 17 grams by mouth daily 1080 g 0  . Probiotic Product (PROBIOTIC-10 PO) Take 1 capsule by mouth daily.     [name] Respiratory Therapy Supplies (FLUTTER) DEVI 1 Device by Does not apply route as needed. 1 each 0  . simvastatin (ZOCOR) 40 MG tablet TAKE 1/2 TABLET  (20  MG  TOTAL) EVERY DAY 45 tablet 2  . traMADol (ULTRAM) 50 MG tablet Take 1 tablet (50 mg total) by mouth at bedtime. 30 tablet 1  . zolpidem (AMBIEN) 10 MG tablet TAKE 1 TABLET BY MOUTH AT BEDTIME AS NEEDED FOR SLEEP 90 tablet 1   No current facility-administered medications on file prior to visit.     Observations/Objective: Pt appears  well  Obese-baseline  Affect is somewhat flat, she seems generally frustrated today  Voices frustration over her ongoing shortness of breath and other medical problems  No facial swelling or asymmetry Voice is not hoarse and no cough or sob on conversation  Pleasant  No rash or acute skin change No tremor   Assessment and Plan: Problem List Items Addressed This Visit      Cardiovascular and Mediastinum   Essential hypertension - Primary    bp in fair control at this time  BP Readings from Last 1 Encounters:  10/16/18 100/70  at home today 140/80 before medication No changes needed Most recent labs reviewed  Disc lifstyle change with low sodium diet and exercise        Non-ischemic cardiomyopathy ([place])    Pt continues cardiology f/u        Respiratory   Bronchiectasis with acute exacerbation ([place])    Pt continues to be frustrated with her sob Plans to touch base with pulmonary         Digestive   Fatty liver    ALT is stable / mildly elevated Low fat diet and wt loss is advised         Endocrine   Hypothyroidism    Hypothyroidism  Pt has no clinical changes No change in energy level/ hair or skin/ edema and no tremor Lab Results  Component Value Date   TSH 4.05 12/04/2018          Relevant Medications   levothyroxine (SYNTHROID) 50  MCG tablet     Other   Hyperlipidemia    Disc goals for lipids and reasons to control them Rev last labs with pt Rev low sat fat diet in detail  Continues simvastatin with fair control       Depression with anxiety    Pt tried cymbalta and had side effects -and stopped it on her own  She has already failed wellbutrin and several ssris  At this point if she wants treatment we would refer fo psychiatry  Also enc her to continue counseling  She would like to do nothing currently and monitor Reviewed stressors/ coping techniques/symptoms/ support sources/ tx options and side effects in detail today She voices stress over "too many zoom" meetings and she does not like them , feels like her time is not respected I enc good self care       Prediabetes    Lab Results  Component Value Date   HGBA1C 5.4 12/04/2018   disc imp of low glycemic diet and wt loss to prevent DM2 n      Breast cancer of upper-outer quadrant of right female breast ([place])    No clinical changes           Follow Up Instructions: Take care of yourself  Don't over commit to things if you are exhausted  We have tried you on multiple depression medicines that did not work out- if depression worsens I recommend you continue counseling and we would also refer you to psychiatry for help with medication  Continue eating less sugar and working on weight loss  Continue pulmonary follow up  Talk to Dr [name] about the hernia that is bothering you  Stay as active as you can be     I discussed the assessment and treatment plan with the patient. The patient was provided an opportunity to ask questions and all were answered. The patient agreed with the plan and demonstrated an understanding of  the instructions.   The patient was advised to call back or seek an in-person evaluation if the symptoms worsen or if the condition fails to improve as anticipated.     Loura Pardon, MD

## 2018-12-09 NOTE — Assessment & Plan Note (Signed)
ALT is stable / mildly elevated Low fat diet and wt loss is advised

## 2018-12-09 NOTE — Assessment & Plan Note (Addendum)
bp in fair control at this time  BP Readings from Last 1 Encounters:  10/16/18 100/70  at home today 140/80 before medication No changes needed Most recent labs reviewed  Disc lifstyle change with low sodium diet and exercise

## 2018-12-09 NOTE — Assessment & Plan Note (Signed)
No clinical changes 

## 2018-12-09 NOTE — Assessment & Plan Note (Signed)
Disc goals for lipids and reasons to control them Rev last labs with pt Rev low sat fat diet in detail  Continues simvastatin with fair control

## 2018-12-09 NOTE — Assessment & Plan Note (Signed)
Pt tried cymbalta and had side effects -and stopped it on her own  She has already failed wellbutrin and several ssris  At this point if she wants treatment we would refer fo psychiatry  Also enc her to continue counseling  She would like to do nothing currently and monitor Reviewed stressors/ coping techniques/symptoms/ support sources/ tx options and side effects in detail today She voices stress over "too many zoom" meetings and she does not like them , feels like her time is not respected I enc good self care

## 2018-12-09 NOTE — Assessment & Plan Note (Signed)
Pt continues cardiology f/u

## 2018-12-09 NOTE — Assessment & Plan Note (Signed)
Lab Results  Component Value Date   HGBA1C 5.4 12/04/2018   disc imp of low glycemic diet and wt loss to prevent DM2 n

## 2018-12-09 NOTE — Assessment & Plan Note (Signed)
Hypothyroidism  Pt has no clinical changes No change in energy level/ hair or skin/ edema and no tremor Lab Results  Component Value Date   TSH 4.05 12/04/2018

## 2018-12-10 NOTE — Progress Notes (Signed)
Remote ICD transmission.   

## 2018-12-25 ENCOUNTER — Other Ambulatory Visit: Payer: Self-pay | Admitting: *Deleted

## 2018-12-25 ENCOUNTER — Telehealth: Payer: Self-pay | Admitting: Rheumatology

## 2018-12-25 DIAGNOSIS — Z5181 Encounter for therapeutic drug level monitoring: Secondary | ICD-10-CM

## 2018-12-25 DIAGNOSIS — G8929 Other chronic pain: Secondary | ICD-10-CM

## 2018-12-25 NOTE — Telephone Encounter (Signed)
Spoke with patient and she will need to go to [facility] to have her UDS performed. Patient verbalized understanding. Orders released. Will send opioid risk tool and narcotic agreement for patient to fill out and send back to the office.

## 2018-12-25 NOTE — Telephone Encounter (Signed)
Patient stopped by the clinic to give urine sample for her pain medication.  Patient requested a return call.

## 2018-12-27 LAB — PAIN MGMT, TRAMADOL W/MEDMATCH, U
Desmethyltramadol: 2913 ng/mL
Tramadol: 6133 ng/mL

## 2018-12-27 LAB — PAIN MGMT, PROFILE 5 W/CONF, U
Amphetamines: NEGATIVE ng/mL
Barbiturates: NEGATIVE ng/mL
Benzodiazepines: NEGATIVE ng/mL
Cocaine Metabolite: NEGATIVE ng/mL
Creatinine: 179 mg/dL
Marijuana Metabolite: NEGATIVE ng/mL
Methadone Metabolite: NEGATIVE ng/mL
Opiates: NEGATIVE ng/mL
Oxidant: NEGATIVE ug/mL
Oxycodone: NEGATIVE ng/mL
pH: 5.7 (ref 4.5–9.0)

## 2018-12-30 ENCOUNTER — Encounter: Payer: Self-pay | Admitting: Internal Medicine

## 2018-12-31 ENCOUNTER — Other Ambulatory Visit: Payer: Self-pay | Admitting: Rheumatology

## 2018-12-31 NOTE — Telephone Encounter (Signed)
Last Visit: 05/14/18 Next Visit: 01/14/19 UDS: 12/25/18 Narc Agreement: 12/25/18  Okay to refill Tramadol?

## 2018-12-31 NOTE — Telephone Encounter (Signed)
Patient requests a refill on Tramadol sent to [facility] on [address]. Patient had lab work done last week.

## 2019-01-01 MED ORDER — TRAMADOL HCL 50 MG PO TABS: 50.0000 mg | ORAL_TABLET | Freq: Every day | ORAL | 1 refills | Status: DC

## 2019-01-01 NOTE — Telephone Encounter (Signed)
ok 

## 2019-01-01 NOTE — Progress Notes (Signed)
Office Visit Note  Patient: [name]             Date of Birth: 08-Jun-1951           MRN: [id number]             PCP: Abner Greenspan, MD Referring: Tower, [name], MD Visit Date: 01/14/2019 Occupation: @GUAROCC @  Subjective:  Right knee joint pain   History of Present Illness: [name] is a 68 y.o. female with history of fibromyalgia, osteoarthritis, and DDD.  She continues to have generalized muscle aches and muscle tenderness due to fibromyalgia.  Overall her fibromyalgia has been well controlled.  She has intermittent right trochanter bursitis.  She experiences severe neck pain especially at night.  She has tried switching pillows she has not made a substantial difference.  She continues to trapezius muscle tension and muscle tenderness.  She takes tramadol 50 mg 1 tablet by mouth at bedtime which helps with pain relief.  She also takes baclofen 10 mg by mouth PRN for muscle spasms.  She continues to have chronic right knee joint pain.  She denies any joint swelling.  She denied any recent injuries.  She states that she has been working in the yard more recently.  She uses Biofreeze topically which provides temporary pain relief.  She is looking into getting a stationary bike which she feels will help with the stiffness in the right knee joint.  She is not ready to proceed with a right knee replacement at this time.  She has bilateral CMC joint pain but no joint swelling.     Activities of Daily Living:  Patient reports morning stiffness for 15 minutes.   Patient reports nocturnal pain.  Difficulty dressing/grooming: Denies Difficulty climbing stairs: Denies Difficulty getting out of chair: Denies Difficulty using hands for taps, buttons, cutlery, and/or writing: Denies  Review of Systems  Constitutional: Negative for fatigue.  HENT: Negative for mouth sores, mouth dryness and nose dryness.   Eyes: Negative for pain, visual disturbance and dryness.  Respiratory: Negative  for cough, hemoptysis, shortness of breath and difficulty breathing.   Cardiovascular: Negative for chest pain, palpitations, hypertension and swelling in legs/feet.  Gastrointestinal: Negative for blood in stool, constipation and diarrhea.  Endocrine: Negative for increased urination.  Genitourinary: Negative for painful urination.  Musculoskeletal: Positive for arthralgias, joint pain and morning stiffness. Negative for joint swelling, myalgias, muscle weakness, muscle tenderness and myalgias.  Skin: Negative for color change, pallor, rash, hair loss, nodules/bumps, skin tightness, ulcers and sensitivity to sunlight.  Allergic/Immunologic: Negative for susceptible to infections.  Neurological: Negative for dizziness, numbness, headaches and weakness.  Hematological: Negative for swollen glands.  Psychiatric/Behavioral: Positive for depressed mood. Negative for sleep disturbance. The patient is not nervous/anxious.     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   Nasopharyngitis, 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/2016   Postnasal drip 05/18/2016   S/P total knee replacement 02/13/2016   Chronic pain of both knees 12/06/2015   Globus pharyngeus 05/17/2015   Productive cough 05/17/2015   Genetic testing 12/23/2014   Family history of breast cancer  Family history of colon cancer    Family history of pancreatic cancer    Breast cancer of upper-outer quadrant of right female breast ([place]) 11/23/2014   Allergy to ACE inhibitors 09/27/2014   Encounter for Medicare annual wellness exam 06/27/2014    Prediabetes 06/23/2013   Routine general medical examination at a health care facility 06/23/2013   RLS (restless legs syndrome) 01/15/2013   Hx of Clostridium difficile infection 10/10/2012   Bronchiectasis with acute exacerbation ([place]) 04/29/2012   Dyspnea on exertion 04/02/2012   Non-ischemic cardiomyopathy ([place]) 03/07/2012   Colon cancer screening 01/11/2012   Post-menopausal 01/11/2012   Chest pain 11/07/2011   Palpitations 04/17/2011   Abnormal tympanic membrane 01/22/2011   Obstructive sleep apnea 04/04/2010   VENTRICULAR TACHYCARDIA 01/03/2009   ICD  [employer]  Single chamber 01/03/2009   PATENT FORAMEN OVALE 09/29/2008   Vitamin D deficiency 08/17/2008   Hypothyroidism 11/18/2006   Hyperlipidemia 11/18/2006   Depression with anxiety 11/18/2006   Essential hypertension 11/18/2006   MITRAL VALVE PROLAPSE 11/18/2006   CEREBRAL ANEURYSM 11/18/2006   Allergic rhinitis 11/18/2006   GERD 11/18/2006   DIVERTICULOSIS, COLON 11/18/2006   Fatty liver 11/18/2006   Fibromyalgia 11/18/2006    Past Medical History:  Diagnosis Date   AICD (automatic cardioverter/defibrillator) present    Anxiety    Arthritis    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   Cerebral aneurysm, nonruptured    had a clip put in   Clostridium difficile infection    Depressive disorder, not elsewhere classified    Diverticulosis of colon (without mention of hemorrhage)    Esophageal reflux    Fibromyalgia    Gastritis    GERD (gastroesophageal reflux disease)    GI bleed 2004   Hiatal hernia    Hyperlipidemia    Hypertension    Hypothyroidism    Internal hemorrhoids    Obstructive sleep apnea (adult) (pediatric)    Ostium secundum type atrial septal defect    Other chronic nonalcoholic liver disease    Other pulmonary embolism and infarction    Paroxysmal ventricular tachycardia (HCC)    Personal history of  radiation therapy    Pneumonia    history   Presence of permanent cardiac pacemaker    PUD (peptic ulcer disease)    Radiation 02/03/15-03/10/15   Right Breast   Sarcoid    per pt , not sure   Schatzki's ring    Stroke ([place]) 2013   tia/ pt feels it was around 2008 0r 2009   Takotsubo syndrome    Tubular adenoma of colon    Unspecified transient cerebral ischemia    Unspecified vitamin D deficiency     Family History  Problem Relation Age of Onset   Diabetes Mother    Alzheimer's disease Mother    Other Brother        Thyroid problem 10/2016   Allergies Sister    Heart disease Sister    Heart disease Brother    Prostate cancer Brother 11       same brother as throat cancer   Throat cancer Brother        dx in his 17s; also a smoker   Heart disease Father    Cancer Maternal Grandmother 13       colon cancer or abdominal cancer   Lung cancer Maternal Grandfather 78   Breast cancer Maternal Aunt 55   Colon cancer Maternal Aunt 61  same sister as breast at 37   Lung cancer Maternal Uncle    Breast cancer Maternal Aunt        dx in her 74s   Pancreatic cancer Cousin 60       maternal first cousin   Breast cancer Cousin        paternal first cousin twice removed died in her 4s   Cardiomyopathy Other        Family history   Heart disease Son        Cardiac Arrest 07/2016   Stroke Neg Hx    Past Surgical History:  Procedure Laterality Date   ABDOMINAL HYSTERECTOMY     ABI  2006   normal   BREAST EXCISIONAL BIOPSY     BREAST LUMPECTOMY     right breast 2016   BREAST LUMPECTOMY WITH RADIOACTIVE SEED LOCALIZATION Right 01/03/2015   Procedure: BREAST LUMPECTOMY WITH RADIOACTIVE SEED LOCALIZATION;  Surgeon: Autumn Messing III, MD;  Location: [place];  Service: General;  Laterality: Right;   CARDIAC DEFIBRILLATOR PLACEMENT  2006; 2012   BSX single chamber ICD   carotid dopplers  2006   neg   CEREBRAL ANEURYSM REPAIR  02/1999    COLONOSCOPY     hospitalization  2004   GI bleed, PUD, diverticulosis (EGD,colonscopy)   hospitalization     PE, NSVT, s/p defib   JOINT REPLACEMENT  2017   left knee    KNEE ARTHROSCOPY     bilateral   LEFT HEART CATHETERIZATION WITH CORONARY ANGIOGRAM N/A 02/22/2012   Procedure: LEFT HEART CATHETERIZATION WITH CORONARY ANGIOGRAM;  Surgeon: Burnell Blanks, MD;  Location: [facility] CATH LAB;  Service: Cardiovascular;  Laterality: N/A;   PARTIAL HYSTERECTOMY     Fibroids   TONGUE BIOPSY  12/12/2017   due to sore tongue and white patches/abnormal cells   TOTAL KNEE ARTHROPLASTY Left 02/13/2016   Procedure: TOTAL KNEE ARTHROPLASTY;  Surgeon: Vickey Huger, MD;  Location: [place];  Service: Orthopedics;  Laterality: Left;   UPPER GASTROINTESTINAL ENDOSCOPY     Social History   Social History Narrative   Lives alone   Caffeine YQM:VHQI   Retired from [employer]   2 children   Divorced   Right handed    Immunization History  Administered Date(s) Administered   H1N1 07/24/2008   Influenza Split 07/17/2011, 09/27/2011, 04/29/2012   Influenza Whole 07/20/2004, 07/04/2007, 05/07/2008, 05/19/2009, 05/17/2010   Influenza, High Dose Seasonal PF 05/30/2017, 05/21/2018   Influenza,inj,Quad PF,6+ Mos 05/20/2013, 05/06/2014, 05/17/2015, 05/15/2016   Influenza-Unspecified 05/07/2018   Pneumococcal Conjugate-13 11/22/2015   Pneumococcal Polysaccharide-23 05/20/2002, 05/07/2008, 06/25/2014   Td 09/07/2009   Zoster 09/21/2011     Objective: Vital Signs: BP 140/78 (BP Location: Left Arm, Patient Position: Sitting, Cuff Size: Normal)    Pulse 69    Resp 15    Ht 5' 6.5" (1.689 m)    Wt 225 lb (102.1 kg)    LMP  (LMP Unknown)    BMI 35.77 kg/m    Physical Exam Vitals signs and nursing note reviewed.  Constitutional:      Appearance: She is well-developed.  HENT:     Head: Normocephalic and atraumatic.  Eyes:     Conjunctiva/sclera: Conjunctivae normal.  Neck:      Musculoskeletal: Normal range of motion.  Cardiovascular:     Rate and Rhythm: Normal rate and regular rhythm.     Heart sounds: Normal heart sounds.  Pulmonary:     Effort: Pulmonary effort is  normal.     Breath sounds: Normal breath sounds.  Abdominal:     General: Bowel sounds are normal.     Palpations: Abdomen is soft.  Lymphadenopathy:     Cervical: No cervical adenopathy.  Skin:    General: Skin is warm and dry.     Capillary Refill: Capillary refill takes less than 2 seconds.  Neurological:     Mental Status: She is alert and oriented to person, place, and time.  Psychiatric:        Behavior: Behavior normal.      Musculoskeletal Exam: C-spine limited range of motion with lateral rotation.  Thoracic and lumbar spine good range of motion.  No midline spinal tenderness.  No SI joint tenderness.  Shoulder joints, elbow joints, wrist joints, MCPs, PIPs, DIPs good range of motion no synovitis.  She has complete fist formation bilaterally.  Hip joints, knee joints, ankle joints, MCPs, PIPs, DIPs good range of motion with no synovitis.  No warmth or effusion of bilateral knee joints.  No tenderness or swelling of ankle joints.  She has tenderness over the right trochanteric bursa.  CDAI Exam: CDAI Score: Not documented Patient Global Assessment: Not documented; Provider Global Assessment: Not documented Swollen: Not documented; Tender: Not documented Joint Exam   Not documented   There is currently no information documented on the homunculus. Go to the Rheumatology activity and complete the homunculus joint exam.  Investigation: No additional findings.  Imaging: No results found.  Recent Labs: Lab Results  Component Value Date   WBC 3.3 (L) 12/04/2018   HGB 12.3 12/04/2018   PLT 178.0 12/04/2018   NA 140 12/04/2018   K 4.0 12/04/2018   CL 106 12/04/2018   CO2 28 12/04/2018   GLUCOSE 101 (H) 12/04/2018   BUN 11 12/04/2018   CREATININE 0.76 12/04/2018   BILITOT 0.3  12/04/2018   ALKPHOS 97 12/04/2018   AST 33 12/04/2018   ALT 38 (H) 12/04/2018   PROT 7.0 12/04/2018   ALBUMIN 4.0 12/04/2018   CALCIUM 9.7 12/04/2018   GFRAA 85 02/27/2017    Speciality Comments: Narc Agreement: 01/16/18  Procedures:  No procedures performed Allergies: Ace inhibitors; Dilantin [phenytoin sodium extended]; Atorvastatin; Ramipril; Rosuvastatin; Cymbalta [duloxetine hcl]; Iodine-131; Paroxetine; Wellbutrin [bupropion]; Amitriptyline hcl; Carbamazepine; Codeine; and Phenytoin sodium extended   Assessment / Plan:     Visit Diagnoses: Fibromyalgia: She has generalized muscle aches muscle tenderness due to fibromyalgia.  Overall 5 myalgia has been well controlled.  She continues to have right trochanter bursitis and was encouraged to reform stretching exercises on a regular basis.  She has chronic neck pain and trapezius muscle tension and muscle spasms.  She takes baclofen 10 mg 1 tablet by mouth daily as needed for muscle spasms.  She continues to have chronic fatigue but it has been stable.  She takes tramadol 1 tablet by mouth at bedtime which helps her sleep and helps her pain level.  She does not need any refills at this time.  She will follow-up in the office in 6 months.  Primary osteoarthritis of right knee: Chronic pain.  No warmth or effusion noted.  She has good ROM with some discomfort.  She has not had any recent injuries.  She has no mechanical symptoms.  She uses biofreeze for pain relief.  She can try using voltaren gel OTC for pain relief.  She has tried to remain active and we discussed the importance of weight loss.  She is planning on purchasing  a stationary bike.   Status post total knee replacement, left: Dr. Dewitt Rota well.  She has no discomfort at this time.  She has good ROM.  No warmth or effusion noted.  She is looking into purchasing a stationary bike for exercise.   Primary osteoarthritis of both hands: She has no joint tenderness or synovitis at this  time.  She has complete fist formation bilaterally.  Joint protection and muscle strengthening were discussed.  Trochanteric bursitis, right hip: She has tenderness over the right trochanteric bursa.  She was encouraged to perform stretching exercises on a regular basis.  DDD (degenerative disc disease), cervical: She has a limited range of motion with lateral rotation.  She is no symptoms of radiculopathy.  She experiences trapezius muscle tension and muscle spasms bilaterally.  She is been experiencing pain at night.  She is tried switching pillows which has not made a substantial difference.  She takes tramadol 50 mg 1 tablet by mouth at bedtime which helps her sleep at night.  If she experiences muscle spasms she takes baclofen 10 mg 1 tablet as needed.  Other medical conditions are listed as follows:  History of gastroesophageal reflux (GERD)  History of TIA (transient ischemic attack)  History of breast cancer  History of hypertension  History of hypothyroidism  Brain aneurysm  History of depression  History of sleep apnea  History of migraine  History of hypercholesterolemia  History of vitamin D deficiency  History of diverticulosis  Hx of Clostridium difficile infection  RLS (restless legs syndrome)   Orders: No orders of the defined types were placed in this encounter.  No orders of the defined types were placed in this encounter.    Follow-Up Instructions: Return in about 6 months (around 07/17/2019) for Osteoarthritis, Fibromyalgia.   Ofilia Neas, PA-C  Note - This record has been created using Dragon software.  Chart creation errors have been sought, but may not always  have been located. Such creation errors do not reflect on  the standard of medical care.

## 2019-01-02 ENCOUNTER — Telehealth: Payer: Self-pay | Admitting: Rheumatology

## 2019-01-02 NOTE — Telephone Encounter (Signed)
[name], pharmacist at [facility] left a voicemail stating they received prescription of Tramadol.  Please call back to verify pain diagnosis.  [phone]

## 2019-01-02 NOTE — Telephone Encounter (Signed)
Spoke with pharmacist and advised that patient is taking tramadol for Fibromyalgia.

## 2019-01-08 ENCOUNTER — Telehealth: Payer: Self-pay | Admitting: Nurse Practitioner

## 2019-01-08 NOTE — Telephone Encounter (Signed)
Patient will need e-visit with APP this afternoon or tomorrow. Thanks.

## 2019-01-08 NOTE — Telephone Encounter (Signed)
Left message for patient to call back  

## 2019-01-08 NOTE — Telephone Encounter (Signed)
Primary Pulmonologist: VS Last office visit and with whom: 10/16/18 with TN What do we see them for (pulmonary problems): Bronchiectasis Last OV assessment/plan: Will order labs and call with results PFT was stable today  Upper airway cough syndrome with allergic rhinitis. - continue singulair, flonase, nasal irrigation, allegra  Obstructive sleep apnea. - she is compliant with CPAP and reports benefit - continue CPAP 7 cm H2O  Bronchiectasis. - prn mucinex, ventolin - flutter valve 3 times daily  Follow up With Dr. Halford Chessman in 6 months after CT scan or sooner if needed  Was appointment offered to patient (explain)?  Patient wanted recommendations first.    Reason for call: Spoke with patient. She stated that she has been dealing with increased SOB for the past week. Within the last few days, it has increased to the point where she can not move without getting SOB. She has a cough and it is only productive at times, she has to really work to be able to cough up phlegm. The phlegm has been yellow.   She denied any body aches or fever. Also denied being around anyone with COVID.   She wants to know if she could abx called in for her.   Pharmacy is [facility] on [address].   Tonya, please advise since you were the last to see her. Thanks!

## 2019-01-09 ENCOUNTER — Encounter: Payer: Self-pay | Admitting: Nurse Practitioner

## 2019-01-09 ENCOUNTER — Telehealth (INDEPENDENT_AMBULATORY_CARE_PROVIDER_SITE_OTHER): Payer: Medicare Other | Admitting: Nurse Practitioner

## 2019-01-09 DIAGNOSIS — J471 Bronchiectasis with (acute) exacerbation: Secondary | ICD-10-CM

## 2019-01-09 MED ORDER — PREDNISONE 10 MG PO TABS: 20.0000 mg | ORAL_TABLET | Freq: Every day | ORAL | 0 refills | Status: DC

## 2019-01-09 MED ORDER — DOXYCYCLINE HYCLATE 100 MG PO TABS: 100.0000 mg | ORAL_TABLET | Freq: Two times a day (BID) | ORAL | 0 refills | Status: DC

## 2019-01-09 NOTE — Assessment & Plan Note (Addendum)
Patient has a tele-visit today for an acute visit.  Patient states that she has been having increased shortness of breath and cough that is productive of yellow sputum.  She states that symptoms started 2 to 3 weeks ago.  He states that symptoms are progressively worsening.  She denies any recent fever.  She states that her O2 sats have been remaining around 95% on room air.   Patient Instructions  Bronchiectasis. - Will order doxycycline -will order prednisone - prn mucinex, ventolin - flutter valve 3 times daily  Upper airway cough syndrome with allergic rhinitis. - continue singulair, flonase, nasal irrigation, allegra  Obstructive sleep apnea. - she is compliant with CPAP and reports benefit - continue CPAP 7 cm H2O   Follow up With Dr. Halford Chessman in 3 months after CT scan or sooner if needed

## 2019-01-09 NOTE — Progress Notes (Signed)
Virtual Visit via Video Note  I connected with [name] on 01/09/19 at  2:30 PM EDT by a video enabled telemedicine application and verified that I am speaking with the correct person using two identifiers.  Location: Patient: home Provider: office   I discussed the limitations of evaluation and management by telemedicine and the availability of in person appointments. The patient expressed understanding and agreed to proceed.  History of Present Illness: 68 year old female, former smoker quit in [place] (15 pack year hx). PMH bronchiectasis, OSA, HTN. Patient of Dr. Halford Chessman, last seen on 02/05/18. Maintained on CPAP 7cm H20. History ofrightbreast cancer with radiation - around 2016  Patient has a tele-visit today for an acute visit.  Patient states that she has been having increased shortness of breath and cough that is productive of yellow sputum.  She states that symptoms started 2 to 3 weeks ago.  She states that symptoms are progressively worsening.  She denies any recent fever.  She states that her O2 sats have been remaining around 95% on room air.  She is compliant with flutter valve.  She has Ventolin as needed.  Patient has a history of OSA is on CPAP and is compliant with CPAP. Denies f/c/s, n/v/d, hemoptysis, PND, leg swelling Denies chest pain or edema    Observations/Objective: PSG 11/29/08 >> AHI 9 HST 11/10/15 >> AHI 7.1, SaO2 low 75% Auto CPAP5/19/19 to 02/03/18 >>used on 29 of 30 nights with average 4 hrs 32 min. Average AHI 5.2 with CPAP 7 cm H2O. Pulmonary tests CT chest 10/08/18 - Pulmonary parenchymal pattern of bronchiectasis, architectural distortion and slight nodularity, similar to 04/10/2012. Findings may be post infectious/postinflammatory in etiology. Sarcoid is not excluded. Given air trapping, chronic hypersensitivity pneumonitis is also considered. Findings are suggestive of an alternative diagnosis (not UIP) per consensus guidelines: Diagnosis of Idiopathic  Pulmonary Fibrosis. Emphysema.  CT chest 04/10/12 >> b/l lower lobe cylindrical BTX and some in upper lobes PFT 04/17/12 >> FEV1 2.60 (110%), FEV1% 85, TLC 4.05 (77%), DLCO 44% CT angio chest 10/26/15 >> patchy GGO in periphery of lungs b/l, basilar BTX, no PE; no significant change compared to 2013 PFT 01/17/17 >> FEV1 2.38 (111%), FEV1% 90, TLC 3.85 (71%), DLCO 58%, no BD  Other imaging CT abdomen pelvis 02/11/18-Suspect interstitial lung disease with bronchiectasis at the lung bases. A high-resolution chest CT for interstitial lung disease may be helpful for further evaluation, particularly if the patient is symptomatic.  Cardiac tests Echo9/24/18 >> mild LVH, EF 55 to 60%, grade 1 DD, mild TR  PFT Results Latest Ref Rng & Units 10/16/2018 01/17/2017  FVC-Pre L 2.52 2.63  FVC-Predicted Pre % 96 98  FVC-Post L 2.49 2.52  FVC-Predicted Post % 94 94  Pre FEV1/FVC % % 90 90  Post FEV1/FCV % % 91 92  FEV1-Pre L 2.27 2.38  FEV1-Predicted Pre % 111 113  FEV1-Post L 2.26 2.32  DLCO UNC% % 59 58  DLCO COR %Predicted % 85 80  TLC L 4.00 3.85  TLC % Predicted % 74 71  RV % Predicted % 58 55     Assessment and Plan: Patient has a tele-visit today for an acute visit.  Patient states that she has been having increased shortness of breath and cough that is productive of yellow sputum.  She states that symptoms started 2 to 3 weeks ago.  She states that symptoms are progressively worsening.  She denies any recent fever.  She states that her  O2 sats have been remaining around 95% on room air.   Patient Instructions  Bronchiectasis. - Will order doxycycline -will order prednisone - prn mucinex, ventolin - flutter valve 3 times daily  Upper airway cough syndrome with allergic rhinitis. - continue singulair, flonase, nasal irrigation, allegra  Obstructive sleep apnea. - she is compliant with CPAP and reports benefit - continue CPAP 7 cm H2O    Follow Up Instructions:  Follow up With  Dr. Halford Chessman in 3 months after CT scan or sooner if needed    I discussed the assessment and treatment plan with the patient. The patient was provided an opportunity to ask questions and all were answered. The patient agreed with the plan and demonstrated an understanding of the instructions.   The patient was advised to call back or seek an in-person evaluation if the symptoms worsen or if the condition fails to improve as anticipated.  I provided 22 minutes of non-face-to-face time during this encounter.   Fenton Foy, NP

## 2019-01-09 NOTE — Telephone Encounter (Signed)
Spoke with pt, I advised her to make video visit with TN. Pt agreed and video visit is today at 2:30pm. Nothing further is needed.

## 2019-01-09 NOTE — Patient Instructions (Addendum)
Bronchiectasis. - Will order doxycycline -will order prednisone - prn mucinex, ventolin - flutter valve 3 times daily  Upper airway cough syndrome with allergic rhinitis. - continue singulair, flonase, nasal irrigation, allegra  Obstructive sleep apnea. - she is compliant with CPAP and reports benefit - continue CPAP 7 cm H2O   Follow up With Dr. Halford Chessman in 6 months after CT scan or sooner if needed

## 2019-01-14 ENCOUNTER — Ambulatory Visit (INDEPENDENT_AMBULATORY_CARE_PROVIDER_SITE_OTHER): Payer: Medicare Other | Admitting: Physician Assistant

## 2019-01-14 ENCOUNTER — Encounter: Payer: Self-pay | Admitting: Physician Assistant

## 2019-01-14 ENCOUNTER — Other Ambulatory Visit: Payer: Self-pay

## 2019-01-14 VITALS — BP 140/78 | HR 69 | Resp 15 | Ht 66.5 in | Wt 225.0 lb

## 2019-01-14 DIAGNOSIS — Z8659 Personal history of other mental and behavioral disorders: Secondary | ICD-10-CM

## 2019-01-14 DIAGNOSIS — M797 Fibromyalgia: Secondary | ICD-10-CM

## 2019-01-14 DIAGNOSIS — M7061 Trochanteric bursitis, right hip: Secondary | ICD-10-CM

## 2019-01-14 DIAGNOSIS — Z8679 Personal history of other diseases of the circulatory system: Secondary | ICD-10-CM

## 2019-01-14 DIAGNOSIS — M19041 Primary osteoarthritis, right hand: Secondary | ICD-10-CM | POA: Diagnosis not present

## 2019-01-14 DIAGNOSIS — I671 Cerebral aneurysm, nonruptured: Secondary | ICD-10-CM

## 2019-01-14 DIAGNOSIS — Z96652 Presence of left artificial knee joint: Secondary | ICD-10-CM | POA: Diagnosis not present

## 2019-01-14 DIAGNOSIS — G2581 Restless legs syndrome: Secondary | ICD-10-CM

## 2019-01-14 DIAGNOSIS — M1711 Unilateral primary osteoarthritis, right knee: Secondary | ICD-10-CM | POA: Diagnosis not present

## 2019-01-14 DIAGNOSIS — Z8719 Personal history of other diseases of the digestive system: Secondary | ICD-10-CM

## 2019-01-14 DIAGNOSIS — Z8639 Personal history of other endocrine, nutritional and metabolic disease: Secondary | ICD-10-CM

## 2019-01-14 DIAGNOSIS — Z8619 Personal history of other infectious and parasitic diseases: Secondary | ICD-10-CM

## 2019-01-14 DIAGNOSIS — M19042 Primary osteoarthritis, left hand: Secondary | ICD-10-CM

## 2019-01-14 DIAGNOSIS — M5030 Other cervical disc degeneration, unspecified cervical region: Secondary | ICD-10-CM

## 2019-01-14 DIAGNOSIS — Z853 Personal history of malignant neoplasm of breast: Secondary | ICD-10-CM

## 2019-01-14 DIAGNOSIS — Z8669 Personal history of other diseases of the nervous system and sense organs: Secondary | ICD-10-CM

## 2019-01-14 DIAGNOSIS — Z8673 Personal history of transient ischemic attack (TIA), and cerebral infarction without residual deficits: Secondary | ICD-10-CM

## 2019-01-21 ENCOUNTER — Ambulatory Visit
Admission: RE | Admit: 2019-01-21 | Discharge: 2019-01-21 | Disposition: A | Discharge status: Home / Self Care | Payer: Medicare Other | Source: Ambulatory Visit | Attending: Family Medicine | Admitting: Family Medicine

## 2019-01-21 ENCOUNTER — Other Ambulatory Visit: Payer: Self-pay

## 2019-01-21 DIAGNOSIS — Z853 Personal history of malignant neoplasm of breast: Secondary | ICD-10-CM

## 2019-01-30 ENCOUNTER — Encounter: Payer: Self-pay | Admitting: *Deleted

## 2019-02-02 ENCOUNTER — Encounter: Payer: Self-pay | Admitting: Internal Medicine

## 2019-02-02 ENCOUNTER — Ambulatory Visit (INDEPENDENT_AMBULATORY_CARE_PROVIDER_SITE_OTHER): Payer: Medicare Other | Admitting: Internal Medicine

## 2019-02-02 ENCOUNTER — Other Ambulatory Visit: Payer: Self-pay

## 2019-02-02 ENCOUNTER — Telehealth: Payer: Self-pay | Admitting: General Surgery

## 2019-02-02 VITALS — Ht 66.5 in | Wt 219.0 lb

## 2019-02-02 DIAGNOSIS — R1031 Right lower quadrant pain: Secondary | ICD-10-CM | POA: Diagnosis not present

## 2019-02-02 DIAGNOSIS — K219 Gastro-esophageal reflux disease without esophagitis: Secondary | ICD-10-CM | POA: Diagnosis not present

## 2019-02-02 DIAGNOSIS — K3189 Other diseases of stomach and duodenum: Secondary | ICD-10-CM

## 2019-02-02 DIAGNOSIS — K31A0 Gastric intestinal metaplasia, unspecified: Secondary | ICD-10-CM

## 2019-02-02 DIAGNOSIS — K429 Umbilical hernia without obstruction or gangrene: Secondary | ICD-10-CM | POA: Diagnosis not present

## 2019-02-02 DIAGNOSIS — K2940 Chronic atrophic gastritis without bleeding: Secondary | ICD-10-CM

## 2019-02-02 DIAGNOSIS — R1032 Left lower quadrant pain: Secondary | ICD-10-CM

## 2019-02-02 DIAGNOSIS — R1310 Dysphagia, unspecified: Secondary | ICD-10-CM

## 2019-02-02 MED ORDER — PANTOPRAZOLE SODIUM 40 MG PO TBEC: 40.0000 mg | DELAYED_RELEASE_TABLET | Freq: Two times a day (BID) | ORAL | 1 refills | Status: DC

## 2019-02-02 NOTE — Telephone Encounter (Signed)
Notified the patient of her Barium swallow appointment and that an rx for pantoprazole was sent in to the pharmacy. The patient asked if we would send information aboutt her colon cleanse to her my chart.

## 2019-02-02 NOTE — Addendum Note (Signed)
Addended by: Lanny Hurst A on: 02/02/2019 04:38 PM   Modules accepted: Orders

## 2019-02-02 NOTE — Progress Notes (Signed)
Subjective:   This service was provided via telemedicine.  Doximity app used for A/V communication but the patient's phone camera failed and changed to telephone encounter The patient was located at home The provider was located in provider's GI office. The patient did consent to this telephone visit and is aware of possible charges through their insurance for this visit.   The persons participating in this telemedicine service were the patient and I. Time spent on call: 22 minutes    Patient ID: [name], female    DOB: 12-06-1950, 68 y.o.   MRN: [id number]  HPI [name] is a 68 year old female with a history of GERD, atrophic gastritis, small hiatal hernia, adenomatous colon polyps, colonic diverticulosis, history of C. difficile colitis, history of breast cancer, history of V. tach status post ICD, history of bronchiectasis, sleep apnea, hypertension who is seen for follow-up.  Last seen in the office in July 2019 by Ellouise Newer, PA-C.  Seen by telephone encounter today in the setting of COVID-19.  She reports that she has been having multiple issues lately.  She struggles with periumbilical abdominal pain related to her known umbilical hernia containing transverse colon.  She has been wearing an abdominal binder.  She has an appointment to see Dr. [name] in July to discuss hernia repair.  She knows Dr. [name] from her prior lumpectomy in 2016.  This pain is more reproducible with bending or lifting.  It is sore to the touch.  She seems to be having a different discomfort in her right and left mid abdomen which feels different than her hernia pain.  This is at times crampy at other times dull and more constant.  Bowel movements are daily with MiraLAX but can occasionally be small.  She does not really feel like she is completely evacuating her colon or having complete bowel movements.  No blood in her stool or melena.  Separate from this she had been off of her pantoprazole for several  months.  She was on it twice a day last summer when she was seen by Ellouise Newer and then she went back to once per day.  After a couple of months off of the medicine she had noticed some nausea and sticky feeling in her stomach.  She resume pantoprazole just within the last week and the nausea has improved.  Her appetite is remained good.  She does occasionally have issues feeling like liquids fail to go down completely into her stomach and at times stop in her lower chest.  Also when she is not eating occasionally she will have saliva seemingly into her airway and cause her to cough and choke.   Her last upper endoscopy was performed on 01/04/2016.  This showed a nonobstructing Schatzki's ring at the GE junction, a mildly tortuous distal esophagus, 3 cm hiatal hernia, mild antral inflammation.  Biopsies from the gastric fundus and body revealed chronic atrophic gastritis with intestinal metaplasia, negative for H. pylori.  No dysplasia.  Antral biopsies showed mild chronic inflammation and reactive changes.  Negative for H. pylori.  No metaplasia, dysplasia or malignancy.   Review of Systems As per HPI, otherwise negative  Current Medications, Allergies, Past Medical History, Past Surgical History, Family History and Social History were reviewed in [employer] record.     Objective:   Physical Exam Physical exam, virtual visit  CBC    Component Value Date/Time   WBC 3.3 (L) 12/04/2018 1240   RBC 4.15 12/04/2018  1240   HGB 12.3 12/04/2018 1240   HGB 12.4 12/01/2014 0845   HCT 37.2 12/04/2018 1240   HCT 38.8 12/01/2014 0845   PLT 178.0 12/04/2018 1240   PLT 213 12/01/2014 0845   MCV 89.7 12/04/2018 1240   MCV 88.5 12/01/2014 0845   MCH 29.5 02/27/2017 1518   MCHC 33.1 12/04/2018 1240   RDW 12.7 12/04/2018 1240   RDW 12.7 12/01/2014 0845   LYMPHSABS 0.9 12/04/2018 1240   LYMPHSABS 1.0 12/01/2014 0845   MONOABS 0.3 12/04/2018 1240   MONOABS 0.3 12/01/2014  0845   EOSABS 0.2 12/04/2018 1240   EOSABS 0.3 12/01/2014 0845   EOSABS 0.1 03/13/2007 1249   BASOSABS 0.0 12/04/2018 1240   BASOSABS 0.0 12/01/2014 0845   CMP     Component Value Date/Time   NA 140 12/04/2018 1240   NA 141 12/01/2014 0847   K 4.0 12/04/2018 1240   K 3.8 12/01/2014 0847   CL 106 12/04/2018 1240   CO2 28 12/04/2018 1240   CO2 26 12/01/2014 0847   GLUCOSE 101 (H) 12/04/2018 1240   GLUCOSE 175 (H) 12/01/2014 0847   BUN 11 12/04/2018 1240   BUN 10.6 12/01/2014 0847   CREATININE 0.76 12/04/2018 1240   CREATININE 0.82 02/27/2017 1518   CREATININE 0.8 12/01/2014 0847   CALCIUM 9.7 12/04/2018 1240   CALCIUM 9.8 12/01/2014 0847   PROT 7.0 12/04/2018 1240   PROT 7.5 12/01/2014 0847   ALBUMIN 4.0 12/04/2018 1240   ALBUMIN 3.9 12/01/2014 0847   AST 33 12/04/2018 1240   AST 29 12/01/2014 0847   ALT 38 (H) 12/04/2018 1240   ALT 33 12/01/2014 0847   ALKPHOS 97 12/04/2018 1240   ALKPHOS 127 12/01/2014 0847   BILITOT 0.3 12/04/2018 1240   BILITOT 0.42 12/01/2014 0847   GFRNONAA 74 02/27/2017 1518   GFRAA 85 02/27/2017 1518       Assessment & Plan:  68 year old female with a history of GERD, atrophic gastritis, small hiatal hernia, adenomatous colon polyps, colonic diverticulosis, history of C. difficile colitis, history of breast cancer, history of V. tach status post ICD, history of bronchiectasis, sleep apnea, hypertension who is seen for follow-up.   1.  Umbilical hernia containing transverse colon --periumbilical abdominal pain associated with umbilical hernia containing transverse colon.  Abdominal binder is helping and she has an appointment to see Dr. [name] in July to discuss umbilical hernia repair --Continue abdominal wall binder --She will see Dr. [name] next month to consider repair  2.  Bilateral mid abdominal pain/constipation/history of diverticulosis --I am suspicious that some of her abdominal pain is related to incomplete evacuation of her colon and  possibly symptomatic diverticulosis.  This is not acute and therefore unlikely to be diverticulosis.  I have recommended the following --Bowel purge with MiraLAX prep; 255 g of MiraLAX in 64 ounces of Gatorade; drink 64 ounces of Gatorade over 3 to 5 hours in order to fully evacuate the colon --Resume MiraLAX 17 g once daily 1 day after the bowel purge --Notify me if this helps with abdominal discomfort about 7 to 10 days after purge  3.  Intermittent aspiration/esophageal dysphagia --she describes 2 separate potential issues.  I question whether she could have micro-or overt aspiration given the coughing she describes.  This also may correlate with her bronchiectasis.  She does have a history of Schatzki's ring and esophageal tortuosity which raises the question of esophageal dysmotility.  I recommended the following: --Barium esophagram with tablet --evaluate for  aspiration, esophageal dysmotility and stricture  4.  GERD/history of atrophic gastritis --surveillance endoscopy was recommended this year which I think is reasonable given her history of atrophic gastritis with intestinal metaplasia.  I will await the barium esophagram before proceeding with repeat endoscopy --We did discuss endoscopy including the risk, benefits and alternatives and she is agreeable and wishes to proceed however I will wait to schedule this after I see the results from #3 --Continue pantoprazole 40 mg once daily; please send to [facility] on [address]

## 2019-02-02 NOTE — Patient Instructions (Addendum)
If you are age 70 or older, your body mass index should be between 23-30. Your Body mass index is 34.82 kg/m. If this is out of the aforementioned range listed, please consider follow up with your Primary Care Provider.  If you are age 20 or younger, your body mass index should be between 19-25. Your Body mass index is 34.82 kg/m. If this is out of the aformentioned range listed, please consider follow up with your Primary Care Provider.   Continue using abdominal wall binder for peri-umbilical hernia. See Dr. [name] next month to consider repair  Recommended the following: Bowel purge with MiraLAX prep; 255 g of MiraLAX in 64 ounces of Gatorade; drink 64 ounces of Gatorade over 3 to 5 hours in order to fully evacuate the colon Resume MiraLAX 17 g once daily 1 day after the bowel purge Notify me if this helps with abdominal discomfort about 7 to 10 days after purge   Barium esophagram with tablet  scheduled 02/18/2019@930  [facility]- patient to arrive at 9:15 and have nothing to eat or drink 3 hours prior.  Surveillance endoscopy  recommended this year. I will await the barium esophagram before proceeding with repeat endoscopy  Continue pantoprazole 40 mg once daily

## 2019-02-16 ENCOUNTER — Encounter: Payer: Self-pay | Admitting: Podiatry

## 2019-02-16 ENCOUNTER — Other Ambulatory Visit: Payer: Self-pay

## 2019-02-16 ENCOUNTER — Ambulatory Visit (INDEPENDENT_AMBULATORY_CARE_PROVIDER_SITE_OTHER): Payer: Medicare Other | Admitting: Podiatry

## 2019-02-16 ENCOUNTER — Ambulatory Visit (INDEPENDENT_AMBULATORY_CARE_PROVIDER_SITE_OTHER): Payer: Medicare Other

## 2019-02-16 ENCOUNTER — Other Ambulatory Visit: Payer: Self-pay | Admitting: Podiatry

## 2019-02-16 DIAGNOSIS — M779 Enthesopathy, unspecified: Secondary | ICD-10-CM

## 2019-02-16 DIAGNOSIS — M79672 Pain in left foot: Secondary | ICD-10-CM

## 2019-02-16 DIAGNOSIS — Q828 Other specified congenital malformations of skin: Secondary | ICD-10-CM

## 2019-02-16 DIAGNOSIS — M79671 Pain in right foot: Secondary | ICD-10-CM

## 2019-02-17 NOTE — Progress Notes (Signed)
Subjective:   Patient ID: [name], female   DOB: 68 y.o.   MRN: [id number]   HPI Patient presents with significant discomfort in the plantar lateral aspect of the right foot and lesion formation both feet that is painful when palpated   ROS      Objective:  Physical Exam  Chronic fasciitis right with keratotic lesion noted bilateral     Assessment:  H&P today I discussed fasciitis condition and chronic keratotic lesions     Plan:  Sterile prep and injected the lateral fascial band 3 mg Kenalog 5 mg Xylocaine and debrided lesions bilateral with no iatrogenic bleeding and reappoint to recheck as needed

## 2019-02-18 ENCOUNTER — Other Ambulatory Visit: Payer: Self-pay

## 2019-02-18 ENCOUNTER — Ambulatory Visit (HOSPITAL_COMMUNITY)
Admission: RE | Admit: 2019-02-18 | Discharge: 2019-02-18 | Disposition: A | Discharge status: Home / Self Care | Payer: Medicare Other | Source: Ambulatory Visit | Attending: Internal Medicine | Admitting: Internal Medicine

## 2019-02-18 DIAGNOSIS — K429 Umbilical hernia without obstruction or gangrene: Secondary | ICD-10-CM

## 2019-02-18 DIAGNOSIS — K31A0 Gastric intestinal metaplasia, unspecified: Secondary | ICD-10-CM

## 2019-02-18 DIAGNOSIS — R1031 Right lower quadrant pain: Secondary | ICD-10-CM | POA: Diagnosis present

## 2019-02-18 DIAGNOSIS — R1032 Left lower quadrant pain: Secondary | ICD-10-CM | POA: Diagnosis present

## 2019-02-18 DIAGNOSIS — K2940 Chronic atrophic gastritis without bleeding: Secondary | ICD-10-CM | POA: Diagnosis present

## 2019-02-18 DIAGNOSIS — R1310 Dysphagia, unspecified: Secondary | ICD-10-CM | POA: Insufficient documentation

## 2019-02-18 DIAGNOSIS — K3189 Other diseases of stomach and duodenum: Secondary | ICD-10-CM | POA: Diagnosis present

## 2019-02-18 DIAGNOSIS — K219 Gastro-esophageal reflux disease without esophagitis: Secondary | ICD-10-CM | POA: Diagnosis present

## 2019-03-04 ENCOUNTER — Telehealth: Payer: Self-pay | Admitting: Internal Medicine

## 2019-03-04 ENCOUNTER — Ambulatory Visit (INDEPENDENT_AMBULATORY_CARE_PROVIDER_SITE_OTHER): Payer: Medicare Other | Admitting: *Deleted

## 2019-03-04 DIAGNOSIS — I472 Ventricular tachycardia, unspecified: Secondary | ICD-10-CM

## 2019-03-04 DIAGNOSIS — I428 Other cardiomyopathies: Secondary | ICD-10-CM

## 2019-03-04 LAB — CUP PACEART REMOTE DEVICE CHECK
Battery Remaining Longevity: 54 mo
Battery Remaining Percentage: 55 %
Brady Statistic RV Percent Paced: 0 %
Date Time Interrogation Session: 20200715070100
HighPow Impedance: 73 Ohm
Implantable Lead Implant Date: 20050603
Implantable Lead Location: 753860
Implantable Lead Model: 185
Implantable Lead Serial Number: 116340
Implantable Pulse Generator Implant Date: 20110505
Lead Channel Impedance Value: 660 Ohm
Lead Channel Pacing Threshold Amplitude: 0.7 V
Lead Channel Pacing Threshold Pulse Width: 0.4 ms
Lead Channel Setting Pacing Amplitude: 2.4 V
Lead Channel Setting Pacing Pulse Width: 0.4 ms
Lead Channel Setting Sensing Sensitivity: 0.4 mV
Pulse Gen Serial Number: 266301

## 2019-03-04 NOTE — Telephone Encounter (Signed)
Pt has a defib check with Caryl Comes 03-17-19. Sent mychart message that she is having on and off chest pain. Please advise

## 2019-03-04 NOTE — Telephone Encounter (Signed)
Called pt to further assess her c/o CP. She states it is intermittent. She notices it more when she is resting. She states it is on her right side above her breast. She states she does not notice the pain getting worse with exertion. She is not SOB, denies shoulder, back, or neck pain.   With the pain being on the right side, I advised pt it may be more musculoskeletal vs cardiac related pain. I encouraged pt to keep her appt with Dr Caryl Comes so he may further assess. In the meantime, if she has continued CP with no relief; back, neck, shoulder pain, SOB or diaphoretic, she understands to call 911 for assistance.   She has verbalized understanding of the above and has no additional questions.

## 2019-03-06 ENCOUNTER — Telehealth: Payer: Self-pay | Admitting: *Deleted

## 2019-03-06 NOTE — Telephone Encounter (Signed)
I spoke with the patient today. Her surgery is not scheduled yet but is planned for sometime in August.  She has been having some issues with chest pain and has an appointment with dr Caryl Comes for an ICD check 7/28- clearance to be addressed at that visit. I will route to Dr Ethlyn Gallery office and remove from the pre op clearance pool.  Kerin Ransom PA-C 03/06/2019 10:27 AM   Trumaine Wimer Rosalyn Gess PA-C 03/06/2019 10:26 AM

## 2019-03-06 NOTE — Telephone Encounter (Signed)
   [facility] Pre-operative Risk Assessment    Request for surgical clearance:  1. What type of surgery is being performed? VENTRAL HERNIA REPAIR  2. When is this surgery scheduled? TBD  3. What type of clearance is required (medical clearance vs. Pharmacy clearance to hold med vs. Both)? MEDICAL  4. Are there any medications that need to be held prior to surgery and how long? ASA   5. Practice name and name of physician performing surgery? [facility]; DR. PAUL TOTH   6. What is your office phone number [phone]   7.   What is your office fax number [phone]  8.   Anesthesia type (None, local, MAC, general) ? GENERAL   Julaine Hua 03/06/2019, 10:11 AM  _________________________________________________________________   (provider comments below)

## 2019-03-14 ENCOUNTER — Encounter: Payer: Self-pay | Admitting: Cardiology

## 2019-03-14 NOTE — Progress Notes (Signed)
Remote ICD transmission.   

## 2019-03-16 ENCOUNTER — Telehealth: Payer: Self-pay | Admitting: Internal Medicine

## 2019-03-16 NOTE — Telephone Encounter (Signed)

## 2019-03-17 ENCOUNTER — Ambulatory Visit (INDEPENDENT_AMBULATORY_CARE_PROVIDER_SITE_OTHER): Payer: Medicare Other | Admitting: Internal Medicine

## 2019-03-17 ENCOUNTER — Encounter: Payer: Self-pay | Admitting: Internal Medicine

## 2019-03-17 ENCOUNTER — Other Ambulatory Visit: Payer: Self-pay

## 2019-03-17 VITALS — BP 136/76 | HR 65 | Ht 66.5 in | Wt 222.2 lb

## 2019-03-17 DIAGNOSIS — I472 Ventricular tachycardia: Secondary | ICD-10-CM

## 2019-03-17 DIAGNOSIS — Z95810 Presence of automatic (implantable) cardiac defibrillator: Secondary | ICD-10-CM | POA: Diagnosis not present

## 2019-03-17 DIAGNOSIS — I4729 Other ventricular tachycardia: Secondary | ICD-10-CM

## 2019-03-17 DIAGNOSIS — I428 Other cardiomyopathies: Secondary | ICD-10-CM

## 2019-03-17 DIAGNOSIS — I10 Essential (primary) hypertension: Secondary | ICD-10-CM | POA: Diagnosis not present

## 2019-03-17 MED ORDER — CARVEDILOL 12.5 MG PO TABS: 12.5000 mg | ORAL_TABLET | Freq: Two times a day (BID) | ORAL | 3 refills | Status: DC

## 2019-03-17 MED ORDER — MAGNESIUM OXIDE -MG SUPPLEMENT 400 (240 MG) MG PO TABS: 400.0000 mg | ORAL_TABLET | Freq: Every day | ORAL | 3 refills | Status: DC

## 2019-03-17 MED ORDER — FUROSEMIDE 20 MG PO TABS: 20.0000 mg | ORAL_TABLET | Freq: Every day | ORAL | 3 refills | Status: DC

## 2019-03-17 MED ORDER — POTASSIUM CHLORIDE ER 10 MEQ PO TBCR: EXTENDED_RELEASE_TABLET | ORAL | 3 refills | Status: DC

## 2019-03-17 MED ORDER — FUROSEMIDE 40 MG PO TABS: 40.0000 mg | ORAL_TABLET | ORAL | 3 refills | Status: DC

## 2019-03-17 NOTE — Patient Instructions (Signed)
Medication Instructions:  Your physician has recommended you make the following change in your medication:   1. Take lasix 40mg  tablet, once daily for the next 3 days. Then begin back to 40mg  every other day.  2. Begin MgOx, 400mg  tablet, once daily.   Labwork: You will have labs drawn today: CBC, CMET, BNP   Testing/Procedures: None ordered.  Follow-Up: Your physician recommends that you schedule a follow-up appointment in:   One Year with Dr. Caryl Comes  Any Other Special Instructions Will Be Listed Below (If Applicable).     If you need a refill on your cardiac medications before your next appointment, please call your pharmacy.

## 2019-03-17 NOTE — Progress Notes (Signed)
Patient Care Team: Tower, [name], MD as PCP - [place], [place], MD as Consulting Physician (General Surgery) Truitt Merle, MD as Consulting Physician (Hematology) Thea Silversmith, MD as Consulting Physician (Radiation Oncology) Rockwell Germany, RN as Registered Nurse Mauro Kaufmann, RN as Registered Nurse Deboraha Sprang, MD as Consulting Physician (Cardiology) Holley Bouche, NP (Inactive) as Nurse Practitioner (Nurse Practitioner) Sylvan Cheese, NP as Nurse Practitioner (Nurse Practitioner) Chesley Mires, MD as Consulting Physician (Pulmonary Disease) Jovita Kussmaul, MD as Consulting Physician (General Surgery) Bo Merino, MD as Consulting Physician (Rheumatology)   HPI  [name] is a 68 y.o. 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  She carries a diagnosis of bronchiectasis.   Complications of 2 distinct types.  One is a quick sensation in her throat which makes it difficult to breathe; the other is a short palpitation that is associated with chest discomfort.  Also complaining of bendopnea; nocturnal dyspnea relieved by sitting up.  There is more dyspnea on exertion.  She has pulmonary follow-up scheduled next week.  She is also been having significant increase in palpitations.  She adjusted her carvedilol spontaneously from 6.25--12.5 with significant improvement give me about 30 minutes of is okay  Past Medical History:  Diagnosis Date  . AICD (automatic cardioverter/defibrillator) present   . Anxiety   . Arthritis   . 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  Cerebral aneurysm, nonruptured    had a clip put in  . Clostridium difficile infection   . Depressive disorder, not elsewhere classified   . Diverticulosis of colon (without mention of hemorrhage)   . Esophageal reflux   . Fibromyalgia   . Gastritis   . GERD (gastroesophageal reflux disease)   . GI bleed 2004  . Hiatal hernia   . Hyperlipidemia   . Hypertension   . Hypothyroidism   . Internal hemorrhoids   . Obstructive sleep apnea (adult) (pediatric)   . Ostium secundum type atrial septal defect   . Other chronic nonalcoholic liver disease   . Other pulmonary embolism and infarction   . Paroxysmal ventricular tachycardia ([place])   . Personal history of radiation therapy   . Pneumonia    history  . Presence of permanent cardiac pacemaker   . PUD (peptic ulcer disease)   . Radiation 02/03/15-03/10/15   Right Breast  . Sarcoid    per pt , not sure  . Schatzki's ring   . Stroke [facility]) 2013   tia/ pt feels it was around 2008 0r 2009  . Takotsubo syndrome   . Tubular adenoma of colon   . Unspecified transient cerebral ischemia   . Unspecified vitamin D deficiency     Past Surgical History:  Procedure Laterality Date  . ABI  2006   normal  . BREAST EXCISIONAL BIOPSY    . BREAST LUMPECTOMY WITH RADIOACTIVE SEED LOCALIZATION Right 01/03/2015   Procedure: BREAST  LUMPECTOMY WITH RADIOACTIVE SEED LOCALIZATION;  Surgeon: Autumn Messing III, MD;  Location: [address];  Service: General;  Laterality: Right;  . CARDIAC DEFIBRILLATOR PLACEMENT  2006; 2012   BSX single chamber ICD  . carotid dopplers  2006   neg  . CEREBRAL ANEURYSM REPAIR  02/1999  . COLONOSCOPY    . hospitalization  2004   GI bleed, PUD, diverticulosis (EGD,colonscopy)  . hospitalization     PE, NSVT, s/p defib  . KNEE ARTHROSCOPY     bilateral  . LEFT HEART CATHETERIZATION WITH CORONARY ANGIOGRAM N/A 02/22/2012   Procedure: LEFT HEART CATHETERIZATION WITH CORONARY ANGIOGRAM;  Surgeon: Burnell Blanks, MD;  Location:  [facility] CATH LAB;  Service: Cardiovascular;  Laterality: N/A;  . PARTIAL HYSTERECTOMY     Fibroids  . TONGUE BIOPSY  12/12/2017   due to sore tongue and white patches/abnormal cells  . TOTAL KNEE ARTHROPLASTY Left 02/13/2016   Procedure: TOTAL KNEE ARTHROPLASTY;  Surgeon: Vickey Huger, MD;  Location: [place];  Service: Orthopedics;  Laterality: Left;  . UPPER GASTROINTESTINAL ENDOSCOPY      Current Outpatient Medications  Medication Sig Dispense Refill  . acetaminophen (TYLENOL) 500 MG tablet Take 500 mg by mouth every 6 (six) hours as needed (pain).     [name] albuterol (PROVENTIL) (2.5 MG/3ML) 0.083% nebulizer solution Take 3 mLs (2.5 mg total) by nebulization every 6 (six) hours as needed for wheezing or shortness of breath. 75 mL 12  . albuterol (VENTOLIN HFA) 108 (90 Base) MCG/ACT inhaler Inhale 2 puffs into the lungs every 6 (six) hours as needed for wheezing or shortness of breath. 3 Inhaler 3  . aspirin 81 MG tablet Take 81 mg by mouth daily.    . baclofen (LIORESAL) 10 MG tablet TAKE 1 TABLET THREE TIMES DAILY (Patient taking differently: 10 mg. 1 daily) 270 tablet 0  . carvedilol (COREG) 12.5 MG tablet Take 1 tablet (12.5 mg total) by mouth 2 (two) times daily. 90 tablet 3  . cholecalciferol (VITAMIN D) 1000 UNITS tablet Take 5,000 Units by mouth daily.     . fexofenadine (ALLEGRA) 180 MG tablet Take 180 mg by mouth daily as needed for allergies or rhinitis.    . fluticasone (FLONASE) 50 MCG/ACT nasal spray Place 2 sprays into both nostrils daily as needed for allergies or rhinitis. 48 g 3  . furosemide (LASIX) 20 MG tablet Take 1 tablet (20 mg total) by mouth daily. 90 tablet 3  . hydroxypropyl methylcellulose / hypromellose (ISOPTO TEARS / GONIOVISC) 2.5 % ophthalmic solution Place 1 drop into both eyes daily.    [name] latanoprost (XALATAN) 0.005 % ophthalmic solution Place 1 drop into both eyes at bedtime.    [name] levothyroxine (SYNTHROID) 50 MCG tablet Take 1 tablet (50 mcg total) by mouth daily. 90  tablet 3  . mometasone (ELOCON) 0.1 % lotion Apply 1 application topically daily as needed (rash).     . montelukast (SINGULAIR) 10 MG tablet Take 1 tablet (10 mg total) by mouth at bedtime. 90 tablet 3  . Multiple Vitamins-Minerals (MULTIVITAMIN PO) Take 1 tablet by mouth daily.    . pantoprazole (PROTONIX) 40 MG tablet Take 1 tablet (40 mg total) by mouth 2 (two) times daily before a meal. 180 tablet 1  . polyethylene glycol powder (GLYCOLAX/MIRALAX) powder Take 17 grams by mouth daily 1080 g 0  . potassium chloride (K-DUR) 10 MEQ tablet TAKE 1/2 TABLET (5MEQ TOTAL) EVERY DAY 45 tablet 3  . Respiratory Therapy Supplies (FLUTTER) DEVI  1 Device by Does not apply route as needed. 1 each 0  . simvastatin (ZOCOR) 40 MG tablet TAKE 1/2 TABLET  (20  MG  TOTAL) EVERY DAY 45 tablet 2  . traMADol (ULTRAM) 50 MG tablet Take 1 tablet (50 mg total) by mouth at bedtime. 30 tablet 1  . zolpidem (AMBIEN) 10 MG tablet TAKE 1 TABLET BY MOUTH AT BEDTIME AS NEEDED FOR SLEEP 90 tablet 1   No current facility-administered medications for this visit.     Allergies  Allergen Reactions  . Ace Inhibitors Swelling    Angioedema; makes tongue "break out"   . Dilantin [Phenytoin Sodium Extended] Rash    Severe rash  . Atorvastatin Other (See Comments)    SEVERE MYALGIA  . Ramipril Other (See Comments)    TONGUE ULCERS   . Rosuvastatin Other (See Comments)    SEVERE MYALGIA  . Cymbalta [Duloxetine Hcl]     Sleepiness/ sick  . Iodine-131 Other (See Comments)    Swelling at IV site only, no swelling or SOB around mouth.!  . Paroxetine Nausea Only    Rapid heartbeat  . Wellbutrin [Bupropion]     Palpitations   . Amitriptyline Hcl Other (See Comments)     makes her too sleepy!  . Carbamazepine Rash  . Codeine Itching  . Phenytoin Sodium Extended Rash    Review of Systems negative except from HPI and PMH  Physical Exam  BP 136/76   Pulse 65   Ht 5' 6.5" (1.689 m)   Wt 222 lb 3.2 oz (100.8 kg)    LMP  (LMP Unknown)   SpO2 96%   BMI 35.33 kg/m   Well developed and well nourished in no acute distress HENT normal Neck supple with JVP-8-10 +HJR Clear Device pocket well healed; without hematoma or erythema.  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  The information was reviewed. No changes were made in the programming.      Infrequent nonsustained VT not likely contributing to her palpitations.  Suspect the brief episodes are PVCs and the longer ones may be slower nonsustained VT.  We will try empiric therapy with mag oxide and if this does not suffice, we will undertake a monitor again.  Bendopnea an elevated JVP is a combined right and left heart failure or just right; I suspect with the orthopnea and nocturnal dyspnea it is the former.  Hence, we will change her diuretics and put her on furosemide.  Initially will increase it from 20--40 daily x3 days then have her take 40 every other day going forward.  If the 40 is inadequate, she will take 60 mg x 3 days and then 60 mg every other day going forward.  Encouraged leass salt   We will check a BNP and a CBC today.   We spent more than 50% of our >25 min visit in face to face counseling regarding the above

## 2019-03-18 LAB — COMPREHENSIVE METABOLIC PANEL
ALT: 41 IU/L — ABNORMAL HIGH (ref 0–32)
AST: 42 IU/L — ABNORMAL HIGH (ref 0–40)
Albumin/Globulin Ratio: 1.5 (ref 1.2–2.2)
Albumin: 4.3 g/dL (ref 3.8–4.8)
Alkaline Phosphatase: 121 IU/L — ABNORMAL HIGH (ref 39–117)
BUN/Creatinine Ratio: 12 (ref 12–28)
BUN: 10 mg/dL (ref 8–27)
Bilirubin Total: 0.3 mg/dL (ref 0.0–1.2)
CO2: 24 mmol/L (ref 20–29)
Calcium: 10.1 mg/dL (ref 8.7–10.3)
Chloride: 102 mmol/L (ref 96–106)
Creatinine, Ser: 0.85 mg/dL (ref 0.57–1.00)
GFR calc Af Amer: 80 mL/min/{1.73_m2} (ref >59)
GFR calc non Af Amer: 70 mL/min/{1.73_m2} (ref >59)
Globulin, Total: 2.9 g/dL (ref 1.5–4.5)
Glucose: 85 mg/dL (ref 65–99)
Potassium: 4.7 mmol/L (ref 3.5–5.2)
Sodium: 140 mmol/L (ref 134–144)
Total Protein: 7.2 g/dL (ref 6.0–8.5)

## 2019-03-18 LAB — CBC
Hematocrit: 39.4 % (ref 34.0–46.6)
Hemoglobin: 13 g/dL (ref 11.1–15.9)
MCH: 28.8 pg (ref 26.6–33.0)
MCHC: 33 g/dL (ref 31.5–35.7)
MCV: 87 fL (ref 79–97)
Platelets: 210 10*3/uL (ref 150–450)
RBC: 4.51 x10E6/uL (ref 3.77–5.28)
RDW: 11.8 % (ref 11.7–15.4)
WBC: 4.5 10*3/uL (ref 3.4–10.8)

## 2019-03-18 LAB — BRAIN NATRIURETIC PEPTIDE: BNP: 92.4 pg/mL (ref 0.0–100.0)

## 2019-03-19 ENCOUNTER — Other Ambulatory Visit: Payer: Self-pay

## 2019-03-23 ENCOUNTER — Ambulatory Visit (INDEPENDENT_AMBULATORY_CARE_PROVIDER_SITE_OTHER): Payer: Medicare Other

## 2019-03-23 ENCOUNTER — Encounter: Payer: Self-pay | Admitting: Nurse Practitioner

## 2019-03-23 ENCOUNTER — Ambulatory Visit (INDEPENDENT_AMBULATORY_CARE_PROVIDER_SITE_OTHER): Payer: Medicare Other | Admitting: Nurse Practitioner

## 2019-03-23 ENCOUNTER — Telehealth: Payer: Self-pay | Admitting: Pulmonary Disease

## 2019-03-23 ENCOUNTER — Other Ambulatory Visit: Payer: Self-pay

## 2019-03-23 DIAGNOSIS — R0781 Pleurodynia: Secondary | ICD-10-CM

## 2019-03-23 DIAGNOSIS — J471 Bronchiectasis with (acute) exacerbation: Secondary | ICD-10-CM

## 2019-03-23 LAB — D-DIMER, QUANTITATIVE: D-Dimer, Quant: 1.87 mcg/mL FEU — ABNORMAL HIGH (ref <0.50)

## 2019-03-23 MED ORDER — PREDNISONE 10 MG PO TABS: ORAL_TABLET | ORAL | 0 refills | Status: DC

## 2019-03-23 MED ORDER — DOXYCYCLINE HYCLATE 100 MG PO TABS: 100.0000 mg | ORAL_TABLET | Freq: Two times a day (BID) | ORAL | 0 refills | Status: DC

## 2019-03-23 NOTE — Assessment & Plan Note (Signed)
Bronchiectasis/right lower back pain: Patient has a tele-visit today for an acute visit.  She complains today of right lower back pain.  She denies any significant shortness of breath.  She does have a chronic cough that is nonproductive.  She denies any edema.  She states that her pain is worse with cough.  She states that the pain has been severe over the past 2 days.  She did take tramadol for the pain and states that this did provide moderate relief.  She states that the pain has not been quite as bad today.  Patient did recently have CBC and CMP per Dr. Olin Pia office.  This did reveal elevated liver enzymes.  Patient denies any recent fever.  Patient Instructions  Will order chest x-ray Will order d-dimer - continue prn mucinex, ventolin - flutter valve3 times daily  Upper airway cough syndrome with allergic rhinitis. - continue singulair, flonase, nasal irrigation, allegra  Obstructive sleep apnea. - she is compliant with CPAP and reports benefit - continue CPAP 7 cm H2O  Follow up: Follow up as already scheduled with Dr. Halford Chessman

## 2019-03-23 NOTE — Patient Instructions (Addendum)
Will order chest x-ray Will order d-dimer - continue prn mucinex, ventolin - flutter valve3 times daily  Upper airway cough syndrome with allergic rhinitis. - continue singulair, flonase, nasal irrigation, allegra  Obstructive sleep apnea. - she is compliant with CPAP and reports benefit - continue CPAP 7 cm H2O  Follow up: Follow up as already scheduled with Dr. Halford Chessman

## 2019-03-23 NOTE — Telephone Encounter (Signed)
Returned call to patient.  Patient states she has had pain in right rib area for 2 days.  Breathing is 'fine' but has some but no worse than usual.  She states she normally has some discomfort on the right side - for one year -but it seems a little worse than normal. Patient has never seem a provider about this pain and has no idea why she is having it. Pain is worse with some movement and is no worse with cough.   Has been taking tylenol with some relief.  When used nebulizer she felt it 'relieved it a little" also.  Offered video visit today.  Has seen T. Nils Pyle, NP in the past and requested her.  Scheduled for 2:30 pm today.  Also has appt with Dr. Halford Chessman this week.  Wants to keep this.  Nothing further needed.

## 2019-03-23 NOTE — Progress Notes (Signed)
Virtual Visit via Telephone Note  I connected with [name] on 03/23/19 at  2:30 PM EDT by telephone and verified that I am speaking with the correct person using two identifiers.  Location: Patient: home Provider: office   I discussed the limitations, risks, security and privacy concerns of performing an evaluation and management service by telephone and the availability of in person appointments. I also discussed with the patient that there may be a patient responsible charge related to this service. The patient expressed understanding and agreed to proceed.   History of Present Illness: 68 year old female, former smoker quit in [place] (15 pack year hx). PMH bronchiectasis, OSA, HTN. Patient of Dr. Halford Chessman, last seen on 02/05/18. Maintained on CPAP 7cm H20. History ofrightbreast cancer with radiation - around 2016  Patient has a tele-visit today for an acute visit.  She complains today of right lower back pain.  She denies any significant shortness of breath.  She does have a chronic cough that is nonproductive.  She denies any edema.  She states that her pain is worse with cough.  She states that the pain has been severe over the past 2 days.  She did take tramadol for the pain and states that this did provide moderate relief.  She states that the pain has not been quite as bad today.  Patient did recently have CBC and CMP per Dr. Olin Pia office.  This did reveal elevated liver enzymes.  Patient denies any recent fever. Denies f/c/s, n/v/d, hemoptysis, PND, leg swelling Denies chest pain or edema    Observations/Objective: PSG 11/29/08 >> AHI 9 HST 11/10/15 >> AHI 7.1, SaO2 low 75% Auto CPAP5/19/19 to 02/03/18 >>used on 29 of 30 nights with average 4 hrs 32 min. Average AHI 5.2 with CPAP 7 cm H2O. Pulmonary tests CT chest 10/08/18 -Pulmonary parenchymal pattern of bronchiectasis, architectural distortion and slight nodularity, similar to 04/10/2012. Findings may be post  infectious/postinflammatory in etiology. Sarcoid is not excluded. Given air trapping, chronic hypersensitivity pneumonitis is also considered. Findings are suggestive of an alternative diagnosis (not UIP) per consensus guidelines: Diagnosis of Idiopathic Pulmonary Fibrosis. Emphysema. CT chest 04/10/12 >> b/l lower lobe cylindrical BTX and some in upper lobes PFT 04/17/12 >> FEV1 2.60 (110%), FEV1% 85, TLC 4.05 (77%), DLCO 44% CT angio chest 10/26/15 >> patchy GGO in periphery of lungs b/l, basilar BTX, no PE; no significant change compared to 2013 PFT 01/17/17 >> FEV1 2.38 (111%), FEV1% 90, TLC 3.85 (71%), DLCO 58%, no BD  Other imaging CT abdomen pelvis6/25/19-Suspect interstitial lung disease with bronchiectasis at the lung bases. A high-resolution chest CT for interstitial lung disease may be helpful for further evaluation, particularly if the patient is symptomatic.  Cardiac tests Echo9/24/18 >> mild LVH, EF 55 to 60%, grade 1 DD, mild TR  PFT Results Latest Ref Rng & Units 10/16/2018 01/17/2017  FVC-Pre L 2.52 2.63  FVC-Predicted Pre % 96 98  FVC-Post L 2.49 2.52  FVC-Predicted Post % 94 94  Pre FEV1/FVC % % 90 90  Post FEV1/FCV % % 91 92  FEV1-Pre L 2.27 2.38  FEV1-Predicted Pre % 111 113  FEV1-Post L 2.26 2.32  DLCO UNC% % 59 58  DLCO COR %Predicted % 85 80  TLC L 4.00 3.85  TLC % Predicted % 74 71  RV % Predicted % 58 55     Assessment and Plan: Bronchiectasis/right lower back pain: Patient has a tele-visit today for an acute visit.  She complains today  of right lower back pain.  She denies any significant shortness of breath.  She does have a chronic cough that is nonproductive.  She denies any edema.  She states that her pain is worse with cough.  She states that the pain has been severe over the past 2 days.  She did take tramadol for the pain and states that this did provide moderate relief.  She states that the pain has not been quite as bad today.  Patient did recently  have CBC and CMP per Dr. Olin Pia office.  This did reveal elevated liver enzymes.  Patient denies any recent fever.  Patient Instructions  Will order chest x-ray Will order d-dimer - continue prn mucinex, ventolin - flutter valve3 times daily  Upper airway cough syndrome with allergic rhinitis. - continue singulair, flonase, nasal irrigation, allegra  Obstructive sleep apnea. - she is compliant with CPAP and reports benefit - continue CPAP 7 cm H2O    Follow Up Instructions: Follow up as already scheduled with Dr. Halford Chessman    I discussed the assessment and treatment plan with the patient. The patient was provided an opportunity to ask questions and all were answered. The patient agreed with the plan and demonstrated an understanding of the instructions.   The patient was advised to call back or seek an in-person evaluation if the symptoms worsen or if the condition fails to improve as anticipated.  I provided 22 minutes of non-face-to-face time during this encounter.   Fenton Foy, NP

## 2019-03-24 ENCOUNTER — Other Ambulatory Visit: Payer: Self-pay | Admitting: General Surgery

## 2019-03-24 ENCOUNTER — Other Ambulatory Visit: Payer: Self-pay

## 2019-03-24 ENCOUNTER — Inpatient Hospital Stay: Admission: RE | Admit: 2019-03-24 | Payer: Medicare Other | Source: Ambulatory Visit

## 2019-03-24 ENCOUNTER — Telehealth: Payer: Self-pay | Admitting: General Surgery

## 2019-03-24 ENCOUNTER — Ambulatory Visit (INDEPENDENT_AMBULATORY_CARE_PROVIDER_SITE_OTHER)
Admission: RE | Admit: 2019-03-24 | Discharge: 2019-03-24 | Disposition: A | Discharge status: Home / Self Care | Payer: Medicare Other | Source: Ambulatory Visit | Attending: Nurse Practitioner | Admitting: Nurse Practitioner

## 2019-03-24 DIAGNOSIS — R7989 Other specified abnormal findings of blood chemistry: Secondary | ICD-10-CM

## 2019-03-24 DIAGNOSIS — R0602 Shortness of breath: Secondary | ICD-10-CM

## 2019-03-24 MED ORDER — IOHEXOL 350 MG/ML SOLN
80.0000 mL | Freq: Once | INTRAVENOUS | Status: AC | PRN
  Administered 2019-03-24: 80 mL via INTRAVENOUS

## 2019-03-24 NOTE — Telephone Encounter (Signed)
I called [facility] & cancelled High Res CT that was scheduled for 8/27 per TN.  Nothing further needed.

## 2019-03-24 NOTE — Telephone Encounter (Signed)
Information below was received from Nani Gasser (Administrator) based on communication with [facility] and communication with patient to advise of testing.  "The pt has appt with Dr Halford Chessman on Friday and she wants to know if she needs to keep that appt.  She would also like for you to call her and explain CT angio to her and what will happen if she has a clot.  She started asking me these questions when I called her with appt info for today."  "Lattie Haw just told me their 11:00 cancelled and she is going to call pt to see if she can come earlier."  "I guess you are going to ask Kenney Houseman if the High Res CT can be cancelled?  Lattie Haw asked me about it."  Message routed to [facility], FNP-C to review the above and advise.  Tonya, based on the information above can you call her directly to answer the questions she asked Judeen Hammans?

## 2019-03-24 NOTE — Telephone Encounter (Signed)
Called patient and advised of cancellation of HRCT and to keep already scheduled appointment with Dr. Halford Chessman. Message routed to [facility] as [facility] for cancellation of HRCT.

## 2019-03-24 NOTE — Telephone Encounter (Signed)
Yes. Please cancel HRCT. We will reorder this if needed. Please keep follow up with Dr. Halford Chessman. I will call her about questions regarding CTA. Thanks.

## 2019-03-27 ENCOUNTER — Encounter: Payer: Self-pay | Admitting: Pulmonary Disease

## 2019-03-27 ENCOUNTER — Other Ambulatory Visit: Payer: Self-pay

## 2019-03-27 ENCOUNTER — Ambulatory Visit (INDEPENDENT_AMBULATORY_CARE_PROVIDER_SITE_OTHER): Payer: Medicare Other | Admitting: Pulmonary Disease

## 2019-03-27 VITALS — BP 138/74 | HR 53 | Temp 97.7°F | Ht 66.5 in | Wt 221.2 lb

## 2019-03-27 DIAGNOSIS — Z9989 Dependence on other enabling machines and devices: Secondary | ICD-10-CM

## 2019-03-27 DIAGNOSIS — J309 Allergic rhinitis, unspecified: Secondary | ICD-10-CM

## 2019-03-27 DIAGNOSIS — G4733 Obstructive sleep apnea (adult) (pediatric): Secondary | ICD-10-CM | POA: Diagnosis not present

## 2019-03-27 DIAGNOSIS — J471 Bronchiectasis with (acute) exacerbation: Secondary | ICD-10-CM

## 2019-03-27 DIAGNOSIS — R05 Cough: Secondary | ICD-10-CM | POA: Diagnosis not present

## 2019-03-27 DIAGNOSIS — R058 Other specified cough: Secondary | ICD-10-CM

## 2019-03-27 NOTE — Patient Instructions (Addendum)
Will change your CPAP to 8 cm water pressure  Use albuterol followed by flutter valve twice per day  Use 2 pills of mucinex twice per day  Will arrange for chest percussion vest to help clear sputum in setting of bronchiectasis  Sip water when you have the urge to cough  Avoid clearing your throat or forcing a cough  Salt water gargle twice per day  Use sugarless candy to keep your mouth moist  Follow up in 3 weeks

## 2019-03-27 NOTE — Progress Notes (Signed)
[facility]  Chief Complaint  Patient presents with  . Follow-up    feels improved since OV with Tonya, NP, feels like she has drainage in throat, non productive cough, DME [employer], feels like she is holding breath in her mouth with cpap, tries using it nightly    Constitutional:  BP 138/74 (BP Location: Right Arm, Cuff Size: Normal)   Pulse (!) 53   Temp 97.7 F (36.5 C) (Oral)   Ht 5' 6.5" (1.689 m)   Wt 221 lb 3.2 oz (100.3 kg)   LMP  (LMP Unknown)   SpO2 97%   BMI 35.17 kg/m   Past Medical History:  Vit D deficiency, Takotsubo CM, Schatzki's ring, PUD, VT s/p AICD, PE, NASH, ASD, Hypothyroidism, HTN, HLD, HH, GERD, Fibromyalgia, Diverticulosis, Depression, C diff, Cerebral aneurysm, Breast cancer 2016, OA  Brief Summary:  [name] is a 68 y.o. female former smoker with bronchiectasis and obstructive sleep apnea.  She was seen by Lazaro Arms earlier this month for exacerbation.  Had chest pain and elevated D dimer.  CT angio chest (reviewed by me) negative for PE, but showed changes for emphysema, BTX, and air trapping.  Remains on prednisone and doxycycline.  Cough isn't as bad, but still having trouble bringing phlegm up.  Has tried mucinex and flutter valve, but not effective to clear secretions completely.  Hasn't been using albuterol much recently.  Not currently having fever.  Wheeze better.  Not having skin rash or joint swelling.    Has ventral hernia.  Followed by surgery and being assessed for intervention.  Physical Exam:   Appearance - well kempt   ENMT - clear nasal mucosa, midline nasal  septum, no oral exudates, no LAN, trachea midline  Respiratory - normal chest wall, normal respiratory effort, no accessory muscle use, no wheeze/rales  CV - s1s2 regular rate and rhythm, no murmurs, no peripheral edema, radial pulses symmetric  GI - soft, non tender, no masses  Lymph - no adenopathy noted in neck and  axillary areas  MSK - normal gait  Ext - no cyanosis, clubbing, or joint inflammation noted  Skin - no rashes, lesions, or ulcers  Neuro - normal strength, oriented x 3  Psych - normal mood and affect   Assessment/Plan:   Bronchiectasis with slow to resolve exacerbation. - having trouble with expectoration in spite of using mucinex and flutter valve >> these have not adequately been able to mobilize respiratory secretions - will arrange for chest percussion vest - continue albuterol bid - complete course of ABx, and prednisone - plan to repeat labs when she is clinically stable - arrange for sputum culture prior to treating with ABx when she has her next exacerbation - defer flu shot until she has recovered from current exacerbation  Upper airway cough syndrome with allergic rhinitis. - continue allegra, flonase, singulair - avoid forcing cough or throat clearing - sugarless candy to maintain salivary flow - salt water gargles bid - sip water with the urge to cough  Obstructive sleep apnea. - she is compliant with CPAP and reports benefit - still has some events - change CPAP to 8 cm H2O  Ventral hernia. - followed by Dr. [name] with surgery - defer surgical clearance until her respiratory status is improved   Patient Instructions  Will change your CPAP to 8 cm water pressure  Use albuterol followed by flutter valve twice per day  Use 2 pills of mucinex twice per  day  Will arrange for chest percussion vest to help clear sputum in setting of bronchiectasis  Sip water when you have the urge to cough  Avoid clearing your throat or forcing a cough  Salt water gargle twice per day  Use sugarless candy to keep your mouth moist  Follow up in 3 weeks   Chesley Mires, MD [facility] Pager: [phone] 03/27/2019, 10:05 AM  Flow Sheet     Pulmonary tests:  PFT 04/17/12 >> FEV1 2.60 (110%), FEV1% 85, TLC 4.05 (77%), DLCO 44% PFT 01/17/17 >>  FEV1 2.38 (111%), FEV1% 90, TLC 3.85 (71%), DLCO 58%, no BD RAST 05/30/17 >> dust mites PFT 10/16/18 >> FEV1 2.27 (111%), FEV1% 90, TLC 4.00 (74%), DLCO 59%  Serology:  04/02/12 >> ACE 1 10/25/15 >> ESR 40, ACE 32 05/30/17 >> IgA, IgG, IgM normal 10/16/18 >> HP panel negative, ACE 25  Chest imaging:  CT chest 04/10/12 >> b/l lower lobe cylindrical BTX and some in upper lobes CT angio chest 10/26/15 >> patchy GGO in periphery of lungs b/l, basilar BTX, no PE; no significant change compared to 2013 HRCT chest 10/08/18 >> atherosclerosis, basilar predominant cylindrical BTX, mild centrilobular/paraseptal emphysema, air trapping CT angio chest 03/24/19 >> Chronic lung changes with peribronchial thickening, bibasilar atelectasis and chronic interstitial disease/peripheral fibrosis at the lung bases  Sleep tests:  PSG 11/29/08 >> AHI 9 HST 11/10/15 >> AHI 7.1, SaO2 low 75% Auto CPAP 808/20 to 03/26/19 >> used on 30 of 30 nights with average 4 hrs 52 min. Average AHI 6.4 with CPAP 7 cm H2O  Cardiac tests:  Echo9/24/18 >> mild LVH, EF 55 to 60%, grade 1 DD, mild TR  Medications:   Allergies as of 03/27/2019      Reactions   Ace Inhibitors Swelling   Angioedema; makes tongue "break out"   Dilantin [phenytoin Sodium Extended] Rash   Severe rash   Atorvastatin Other (See Comments)   SEVERE MYALGIA   Ramipril Other (See Comments)   TONGUE ULCERS   Rosuvastatin Other (See Comments)   SEVERE MYALGIA   Cymbalta [duloxetine Hcl]    Sleepiness/ sick   Iodine-131 Other (See Comments)   Swelling at IV site only, no swelling or SOB around mouth.!   Paroxetine Nausea Only   Rapid heartbeat   Wellbutrin [bupropion]    Palpitations    Amitriptyline Hcl Other (See Comments)    makes her too sleepy!   Carbamazepine Rash   Codeine Itching   Phenytoin Sodium Extended Rash      Medication List       Accurate as of March 27, 2019 10:05 AM. If you have any questions, ask your nurse or doctor.         acetaminophen 500 MG tablet Commonly known as: TYLENOL Take 500 mg by mouth every 6 (six) hours as needed (pain).   albuterol 108 (90 Base) MCG/ACT inhaler Commonly known as: Ventolin HFA Inhale 2 puffs into the lungs every 6 (six) hours as needed for wheezing or shortness of breath.   albuterol (2.5 MG/3ML) 0.083% nebulizer solution Commonly known as: PROVENTIL Take 3 mLs (2.5 mg total) by nebulization every 6 (six) hours as needed for wheezing or shortness of breath.   aspirin 81 MG tablet Take 81 mg by mouth daily.   baclofen 10 MG tablet Commonly known as: LIORESAL TAKE 1 TABLET THREE TIMES DAILY What changed:   how to take this  when to take this  additional instructions   carvedilol  12.5 MG tablet Commonly known as: COREG Take 1 tablet (12.5 mg total) by mouth 2 (two) times daily.   cholecalciferol 1000 units tablet Commonly known as: VITAMIN D Take 5,000 Units by mouth daily.   doxycycline 100 MG tablet Commonly known as: VIBRA-TABS Take 1 tablet (100 mg total) by mouth 2 (two) times daily.   fexofenadine 180 MG tablet Commonly known as: ALLEGRA Take 180 mg by mouth daily as needed for allergies or rhinitis.   fluticasone 50 MCG/ACT nasal spray Commonly known as: FLONASE Place 2 sprays into both nostrils daily as needed for allergies or rhinitis.   Flutter Devi 1 Device by Does not apply route as needed.   furosemide 40 MG tablet Commonly known as: Lasix Take 1 tablet (40 mg total) by mouth every other day.   hydroxypropyl methylcellulose / hypromellose 2.5 % ophthalmic solution Commonly known as: ISOPTO TEARS / GONIOVISC Place 1 drop into both eyes daily.   latanoprost 0.005 % ophthalmic solution Commonly known as: XALATAN Place 1 drop into both eyes at bedtime.   levothyroxine 50 MCG tablet Commonly known as: SYNTHROID Take 1 tablet (50 mcg total) by mouth daily.   Magnesium Oxide 400 (240 Mg) MG Tabs Take 1 tablet (400 mg total) by mouth  daily.   mometasone 0.1 % lotion Commonly known as: ELOCON Apply 1 application topically daily as needed (rash).   montelukast 10 MG tablet Commonly known as: SINGULAIR Take 1 tablet (10 mg total) by mouth at bedtime.   MULTIVITAMIN PO Take 1 tablet by mouth daily.   pantoprazole 40 MG tablet Commonly known as: PROTONIX Take 1 tablet (40 mg total) by mouth 2 (two) times daily before a meal.   polyethylene glycol powder 17 GM/SCOOP powder Commonly known as: GLYCOLAX/MIRALAX Take 17 grams by mouth daily   potassium chloride 10 MEQ tablet Commonly known as: K-DUR TAKE 1/2 TABLET (5MEQ TOTAL) EVERY DAY   predniSONE 10 MG tablet Commonly known as: DELTASONE Take 4 tabs for 2 days, then 3 tabs for 2 days, then 2 tabs for 2 days, then 1 tab for 2 days, then stop   simvastatin 40 MG tablet Commonly known as: ZOCOR TAKE 1/2 TABLET  (20  MG  TOTAL) EVERY DAY   traMADol 50 MG tablet Commonly known as: ULTRAM Take 1 tablet (50 mg total) by mouth at bedtime.   zolpidem 10 MG tablet Commonly known as: AMBIEN TAKE 1 TABLET BY MOUTH AT BEDTIME AS NEEDED FOR SLEEP       Past Surgical History:  She  has a past surgical history that includes Partial hysterectomy; Cerebral aneurysm repair (02/1999); Knee arthroscopy; Cardiac defibrillator placement (2006; 2012); carotid dopplers (2006); hospitalization (2004); hospitalization; ABI (2006); left heart catheterization with coronary angiogram (N/A, 02/22/2012); Breast lumpectomy with radioactive seed localization (Right, 01/03/2015); Total knee arthroplasty (Left, 02/13/2016); Breast excisional biopsy; Tongue Biopsy (12/12/2017); Colonoscopy; and Upper gastrointestinal endoscopy.  Family History:  Her family history includes Allergies in her sister; Alzheimer's disease in her mother; Breast cancer in her cousin and maternal aunt; Breast cancer (age of onset: 35) in her maternal aunt; Cancer (age of onset: 41) in her maternal grandmother;  Cardiomyopathy in an other family member; Colon cancer (age of onset: 25) in her maternal aunt; Diabetes in her mother; Heart disease in her brother, father, sister, and son; Lung cancer in her maternal uncle; Lung cancer (age of onset: 28) in her maternal grandfather; Other in her brother; Pancreatic cancer (age of onset: 36) in her  cousin; Prostate cancer (age of onset: 69) in her brother; Throat cancer in her brother.  Social History:  She  reports that she quit smoking about 29 years ago. Her smoking use included cigarettes. She has a 15.00 pack-year smoking history. She has never used smokeless tobacco. She reports current alcohol use. She reports that she does not use drugs.

## 2019-03-30 ENCOUNTER — Telehealth: Payer: Self-pay | Admitting: Pulmonary Disease

## 2019-03-30 DIAGNOSIS — J479 Bronchiectasis, uncomplicated: Secondary | ICD-10-CM

## 2019-03-30 NOTE — Telephone Encounter (Signed)
[name], [name]  Mikael Spray Monday morning! :) Hope you had a great weekend!   Do you think there is a NP that would sign this patient's vest order? If so, I can generate a new one with their name and NPI number on it.  I hate to make the patient wait until Wednesday if we don't have to.   Thank you for your help!!  [name]

## 2019-03-30 NOTE — Telephone Encounter (Signed)
OK to sign order for vest under my name. Thanks so much

## 2019-03-30 NOTE — Telephone Encounter (Signed)
[name]- would you be okay with [place] ordering VEST under your name? Sood sent the following order to [employer] but has not signed yet:  Note   Please arrange for chest percussion vest for bronchiectasis.

## 2019-03-30 NOTE — Telephone Encounter (Signed)
New order placed for VEST under Judson Roch

## 2019-03-31 ENCOUNTER — Telehealth: Payer: Self-pay | Admitting: *Deleted

## 2019-03-31 NOTE — Telephone Encounter (Signed)
Addressed in result notes  

## 2019-03-31 NOTE — Telephone Encounter (Signed)
Patient was asked if she was taking anything.  Patient wanted to let Sh[place]ale know that she has been taking Mucinex every day.

## 2019-03-31 NOTE — Telephone Encounter (Signed)
Best number [phone] Pt returned your call

## 2019-03-31 NOTE — Telephone Encounter (Signed)
See result notes, pt called back to let Dr. Glori Bickers know she is also taking mucinex

## 2019-03-31 NOTE — Telephone Encounter (Signed)
Left VM requesting pt to call the office back regarding lab results  

## 2019-04-01 NOTE — Progress Notes (Signed)
Reviewed and agree with assessment/plan.   Saleemah Mollenhauer, MD [facility] 08/15/2016, 12:24 PM Pager:  [phone]  

## 2019-04-11 NOTE — Progress Notes (Signed)
Reviewed and agree with assessment/plan.   Aunesti Pellegrino, MD [facility] 08/15/2016, 12:24 PM Pager:  [phone]  

## 2019-04-16 ENCOUNTER — Other Ambulatory Visit: Payer: Medicare Other

## 2019-04-17 ENCOUNTER — Ambulatory Visit (INDEPENDENT_AMBULATORY_CARE_PROVIDER_SITE_OTHER): Payer: Medicare Other | Admitting: Pulmonary Disease

## 2019-04-17 ENCOUNTER — Other Ambulatory Visit: Payer: Self-pay

## 2019-04-17 ENCOUNTER — Encounter: Payer: Self-pay | Admitting: Pulmonary Disease

## 2019-04-17 VITALS — BP 126/78 | HR 68 | Temp 97.9°F | Ht 66.5 in | Wt 221.6 lb

## 2019-04-17 DIAGNOSIS — J309 Allergic rhinitis, unspecified: Secondary | ICD-10-CM

## 2019-04-17 DIAGNOSIS — R05 Cough: Secondary | ICD-10-CM | POA: Diagnosis not present

## 2019-04-17 DIAGNOSIS — G4733 Obstructive sleep apnea (adult) (pediatric): Secondary | ICD-10-CM

## 2019-04-17 DIAGNOSIS — R058 Other specified cough: Secondary | ICD-10-CM

## 2019-04-17 DIAGNOSIS — Z9989 Dependence on other enabling machines and devices: Secondary | ICD-10-CM

## 2019-04-17 DIAGNOSIS — Z01811 Encounter for preprocedural respiratory examination: Secondary | ICD-10-CM

## 2019-04-17 DIAGNOSIS — J479 Bronchiectasis, uncomplicated: Secondary | ICD-10-CM | POA: Diagnosis not present

## 2019-04-17 DIAGNOSIS — Z23 Encounter for immunization: Secondary | ICD-10-CM | POA: Diagnosis not present

## 2019-04-17 MED ORDER — ALBUTEROL SULFATE HFA 108 (90 BASE) MCG/ACT IN AERS: 2.0000 | INHALATION_SPRAY | Freq: Four times a day (QID) | RESPIRATORY_TRACT | 3 refills | Status: DC | PRN

## 2019-04-17 MED ORDER — FUROSEMIDE 40 MG PO TABS: 40.0000 mg | ORAL_TABLET | ORAL | 3 refills | Status: DC

## 2019-04-17 NOTE — Patient Instructions (Signed)
High dose flu shot today  Follow up in 1 month

## 2019-04-17 NOTE — Progress Notes (Signed)
[facility]  Chief Complaint  Patient presents with  . Follow-up    F/U re: Cpap. Reports no concerns with cpap. She states her breathing is mostly at her baseline.     Constitutional:  BP 126/78   Pulse 68   Temp 97.9 F (36.6 C) (Temporal)   Ht 5' 6.5" (1.689 m)   Wt 221 lb 9.6 oz (100.5 kg)   LMP  (LMP Unknown)   SpO2 96%   BMI 35.23 kg/m   Past Medical History:  Vit D deficiency, Takotsubo CM, Schatzki's ring, PUD, VT s/p AICD, PE, NASH, ASD, Hypothyroidism, HTN, HLD, HH, GERD, Fibromyalgia, Diverticulosis, Depression, C diff, Cerebral aneurysm, Breast cancer 2016, OA  Brief Summary:  [name] is a 68 y.o. female former smoker with bronchiectasis and obstructive sleep apnea.  Completed course of prednisone and ABx.  Still gets intermittent cough, but not bringing up as much sputum.  Got chest vest and using bid.  Not having chest pain, wheeze, fever, or hemoptysis.  Has ventral hernia.  Followed by surgery and being assessed for intervention.  Physical Exam:   Appearance - well kempt   ENMT - no sinus tenderness, no nasal discharge, no oral exudate  Neck - no masses, trachea midline, no thyromegaly, no elevation in JVP  Respiratory - normal appearance of chest wall, normal respiratory effort w/o accessory muscle use, no dullness on percussion, no wheezing or rales  CV - s1s2 regular rate and rhythm, no murmurs, no peripheral edema, radial pulses symmetric  GI - soft, non tender  Lymph - no adenopathy noted in neck and axillary areas  MSK - normal gait  Ext - no cyanosis, clubbing, or joint inflammation noted  Skin - no rashes, lesions, or ulcers  Neuro - normal strength, oriented x 3  Psych - normal mood and affect   Assessment/Plan:   Bronchiectasis. - recovered from recent exacerbation - continue prn albuterol, mucinex, flutter, valve, chest vest - don't think she needs rotating ABx at this time -  high dose flu shot today  Upper airway cough syndrome with allergic rhinitis. - continue allegra, flonase, singulair  Obstructive sleep apnea. - she is compliant with CPAP - improved since changing to CPAP 8 cm H2O  Ventral hernia. - followed by Dr. [name] with surgery - she can proceed with surgery if needed   Patient Instructions  High dose flu shot today  Follow up in 1 month   Chesley Mires, MD [place] Pager: [phone] 04/17/2019, 11:58 AM  Flow Sheet     Pulmonary tests:  PFT 04/17/12 >> FEV1 2.60 (110%), FEV1% 85, TLC 4.05 (77%), DLCO 44% PFT 01/17/17 >> FEV1 2.38 (111%), FEV1% 90, TLC 3.85 (71%), DLCO 58%, no BD RAST 05/30/17 >> dust mites PFT 10/16/18 >> FEV1 2.27 (111%), FEV1% 90, TLC 4.00 (74%), DLCO 59%  Serology:  04/02/12 >> ACE 1 10/25/15 >> ESR 40, ACE 32 05/30/17 >> IgA, IgG, IgM normal 10/16/18 >> HP panel negative, ACE 25  Chest imaging:  CT chest 04/10/12 >> b/l lower lobe cylindrical BTX and some in upper lobes CT angio chest 10/26/15 >> patchy GGO in periphery of lungs b/l, basilar BTX, no PE; no significant change compared to 2013 HRCT chest 10/08/18 >> atherosclerosis, basilar predominant cylindrical BTX, mild centrilobular/paraseptal emphysema, air trapping CT angio chest 03/24/19 >> Chronic lung changes with peribronchial thickening, bibasilar atelectasis and chronic interstitial disease/peripheral fibrosis at the lung bases  Sleep tests:  PSG  11/29/08 >> AHI 9 HST 11/10/15 >> AHI 7.1, SaO2 low 75% CPAP 03/17/19 to 04/15/19 >> used on 29 of 30 nights with average 4 hrs 39 min.  Average AHI 4.5 with CPAP 8 cm H2O  Cardiac tests:  Echo9/24/18 >> mild LVH, EF 55 to 60%, grade 1 DD, mild TR  Medications:   Allergies as of 04/17/2019      Reactions   Ace Inhibitors Swelling   Angioedema; makes tongue "break out"   Dilantin [phenytoin Sodium Extended] Rash   Severe rash   Atorvastatin Other (See Comments)   SEVERE MYALGIA    Ramipril Other (See Comments)   TONGUE ULCERS   Rosuvastatin Other (See Comments)   SEVERE MYALGIA   Cymbalta [duloxetine Hcl]    Sleepiness/ sick   Iodine-131 Other (See Comments)   Swelling at IV site only, no swelling or SOB around mouth.!   Paroxetine Nausea Only   Rapid heartbeat   Wellbutrin [bupropion]    Palpitations    Amitriptyline Hcl Other (See Comments)    makes her too sleepy!   Carbamazepine Rash   Codeine Itching   Phenytoin Sodium Extended Rash      Medication List       Accurate as of April 17, 2019 11:58 AM. If you have any questions, ask your nurse or doctor.        STOP taking these medications   doxycycline 100 MG tablet Commonly known as: VIBRA-TABS Stopped by: Chesley Mires, MD   predniSONE 10 MG tablet Commonly known as: DELTASONE Stopped by: Chesley Mires, MD     TAKE these medications   acetaminophen 500 MG tablet Commonly known as: TYLENOL Take 500 mg by mouth every 6 (six) hours as needed (pain).   albuterol (2.5 MG/3ML) 0.083% nebulizer solution Commonly known as: PROVENTIL Take 3 mLs (2.5 mg total) by nebulization every 6 (six) hours as needed for wheezing or shortness of breath.   albuterol 108 (90 Base) MCG/ACT inhaler Commonly known as: Ventolin HFA Inhale 2 puffs into the lungs every 6 (six) hours as needed for wheezing or shortness of breath.   aspirin 81 MG tablet Take 81 mg by mouth daily.   baclofen 10 MG tablet Commonly known as: LIORESAL TAKE 1 TABLET THREE TIMES DAILY What changed:   how to take this  when to take this  additional instructions   carvedilol 12.5 MG tablet Commonly known as: COREG Take 1 tablet (12.5 mg total) by mouth 2 (two) times daily.   cholecalciferol 1000 units tablet Commonly known as: VITAMIN D Take 5,000 Units by mouth daily.   fexofenadine 180 MG tablet Commonly known as: ALLEGRA Take 180 mg by mouth daily as needed for allergies or rhinitis.   fluticasone 50 MCG/ACT nasal spray  Commonly known as: FLONASE Place 2 sprays into both nostrils daily as needed for allergies or rhinitis.   Flutter Devi 1 Device by Does not apply route as needed.   furosemide 40 MG tablet Commonly known as: Lasix Take 1 tablet (40 mg total) by mouth every other day.   hydroxypropyl methylcellulose / hypromellose 2.5 % ophthalmic solution Commonly known as: ISOPTO TEARS / GONIOVISC Place 1 drop into both eyes daily.   latanoprost 0.005 % ophthalmic solution Commonly known as: XALATAN Place 1 drop into both eyes at bedtime.   levothyroxine 50 MCG tablet Commonly known as: SYNTHROID Take 1 tablet (50 mcg total) by mouth daily.   Magnesium Oxide 400 (240 Mg) MG Tabs Take 1 tablet (  400 mg total) by mouth daily.   mometasone 0.1 % lotion Commonly known as: ELOCON Apply 1 application topically daily as needed (rash).   montelukast 10 MG tablet Commonly known as: SINGULAIR Take 1 tablet (10 mg total) by mouth at bedtime.   MULTIVITAMIN PO Take 1 tablet by mouth daily.   pantoprazole 40 MG tablet Commonly known as: PROTONIX Take 1 tablet (40 mg total) by mouth 2 (two) times daily before a meal.   polyethylene glycol powder 17 GM/SCOOP powder Commonly known as: GLYCOLAX/MIRALAX Take 17 grams by mouth daily   potassium chloride 10 MEQ tablet Commonly known as: K-DUR TAKE 1/2 TABLET (5MEQ TOTAL) EVERY DAY   simvastatin 40 MG tablet Commonly known as: ZOCOR TAKE 1/2 TABLET  (20  MG  TOTAL) EVERY DAY   traMADol 50 MG tablet Commonly known as: ULTRAM Take 1 tablet (50 mg total) by mouth at bedtime.   zolpidem 10 MG tablet Commonly known as: AMBIEN TAKE 1 TABLET BY MOUTH AT BEDTIME AS NEEDED FOR SLEEP       Past Surgical History:  She  has a past surgical history that includes Partial hysterectomy; Cerebral aneurysm repair (02/1999); Knee arthroscopy; Cardiac defibrillator placement (2006; 2012); carotid dopplers (2006); hospitalization (2004); hospitalization; ABI  (2006); left heart catheterization with coronary angiogram (N/A, 02/22/2012); Breast lumpectomy with radioactive seed localization (Right, 01/03/2015); Total knee arthroplasty (Left, 02/13/2016); Breast excisional biopsy; Tongue Biopsy (12/12/2017); Colonoscopy; and Upper gastrointestinal endoscopy.  Family History:  Her family history includes Allergies in her sister; Alzheimer's disease in her mother; Breast cancer in her cousin and maternal aunt; Breast cancer (age of onset: 35) in her maternal aunt; Cancer (age of onset: 53) in her maternal grandmother; Cardiomyopathy in an other family member; Colon cancer (age of onset: 25) in her maternal aunt; Diabetes in her mother; Heart disease in her brother, father, sister, and son; Lung cancer in her maternal uncle; Lung cancer (age of onset: 25) in her maternal grandfather; Other in her brother; Pancreatic cancer (age of onset: 57) in her cousin; Prostate cancer (age of onset: 31) in her brother; Throat cancer in her brother.  Social History:  She  reports that she quit smoking about 29 years ago. Her smoking use included cigarettes. She has a 15.00 pack-year smoking history. She has never used smokeless tobacco. She reports current alcohol use. She reports that she does not use drugs.

## 2019-04-20 IMAGING — CT CT ABD-PELV W/ CM
2 of 5 series · 15 of 46 positions shown, 17 images · IV contrast (iopamidol)
Comparison: Chest CT 10/26/2015

CLINICAL DATA: One year history of mid to right lower quadrant
abdominal pain.

EXAM:
CT ABDOMEN AND PELVIS WITH CONTRAST
TECHNIQUE: Multidetector CT imaging of the abdomen and pelvis was performed
using the standard protocol following bolus administration of
intravenous contrast.
CONTRAST:  100mL [id number] IOPAMIDOL ([id number]) INJECTION 61%

[Series 2: abd/pel w · axial · 0.74mm/px · z∈[-414,-14]mm · 12 of 91 slices shown, 14 images]
[im 6/91  soft-tissue]
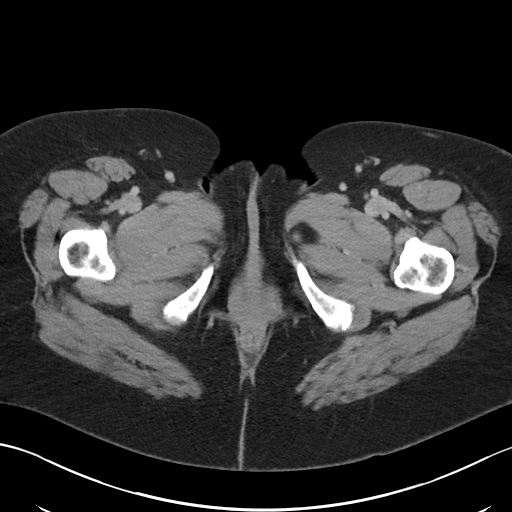
[im 6/91  bone]
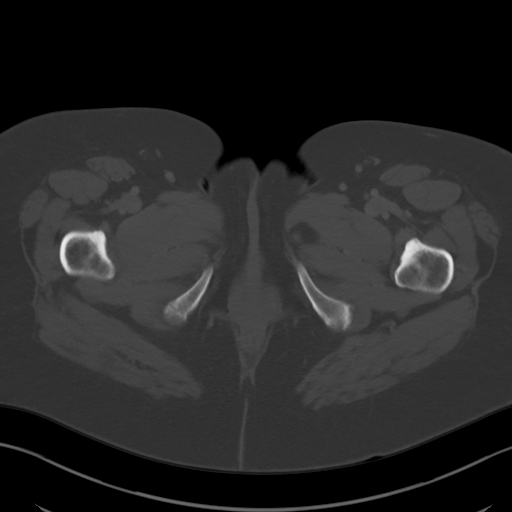
[im 16/91  soft-tissue]
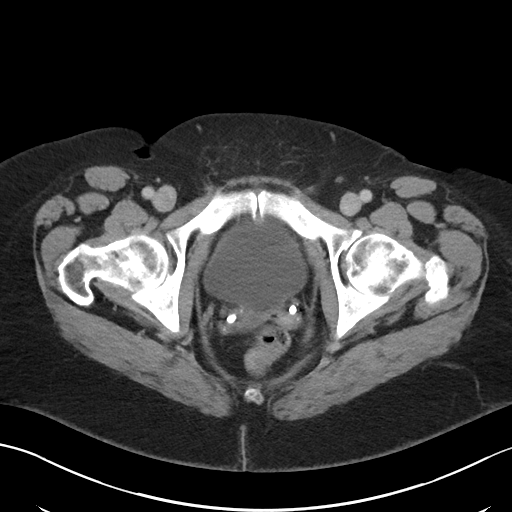
[im 21/91  soft-tissue]
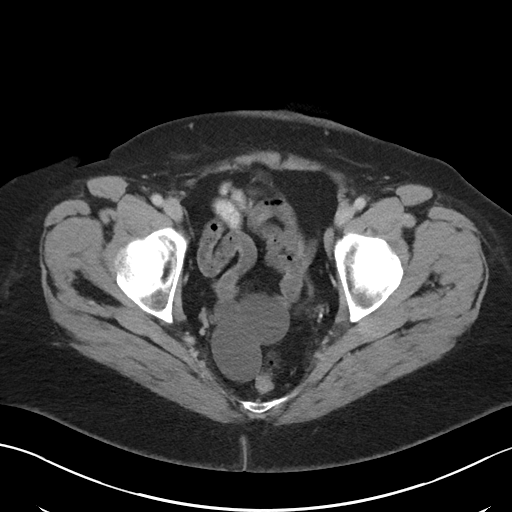
[im 26/91  soft-tissue]
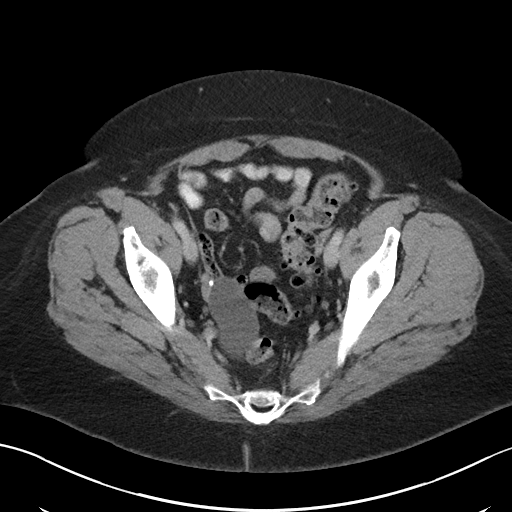
[im 36/91  soft-tissue]
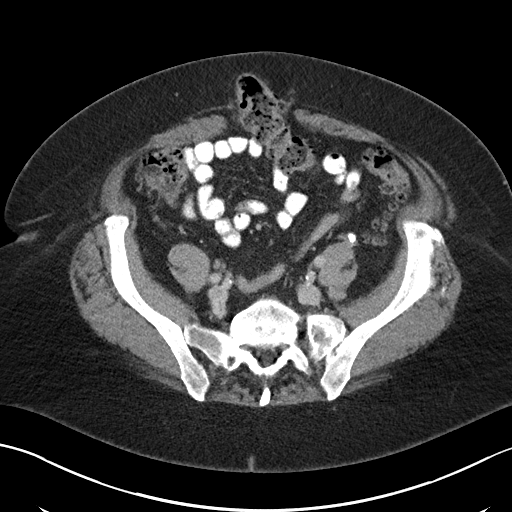
[im 41/91  soft-tissue]
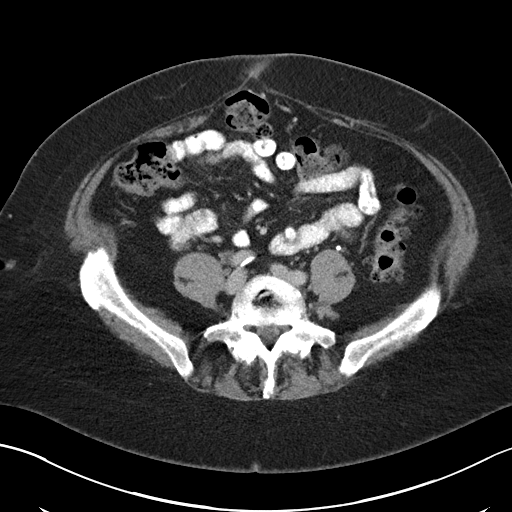
[im 51/91  soft-tissue]
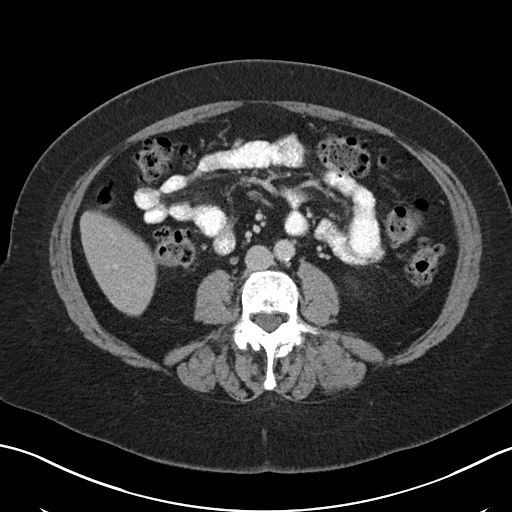
[im 56/91  soft-tissue]
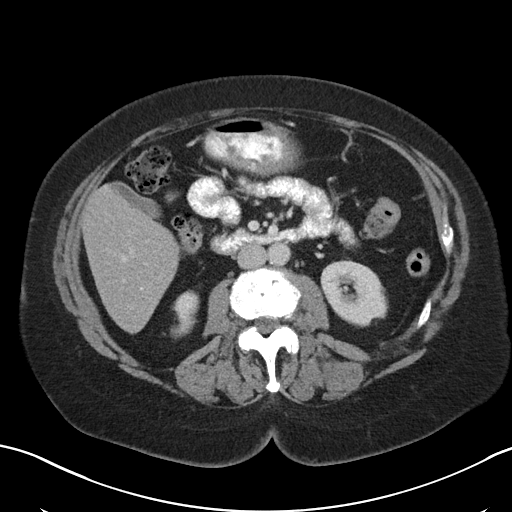
[im 66/91  soft-tissue]
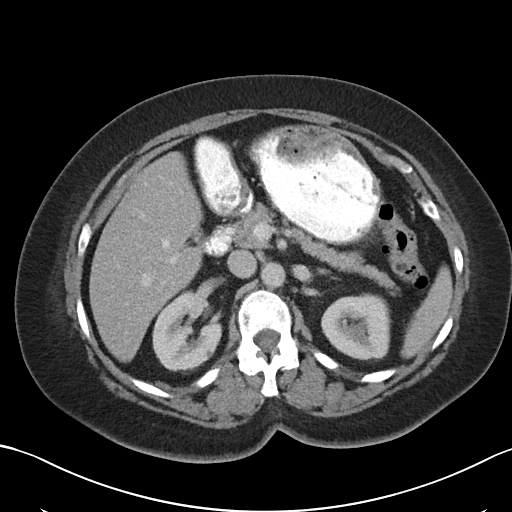
[im 66/91  bone]
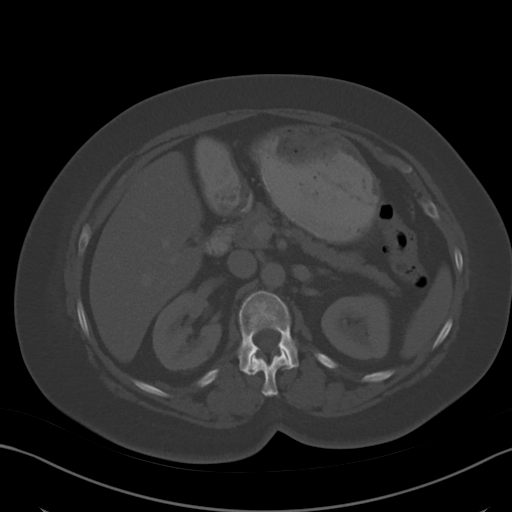
[im 71/91  soft-tissue]
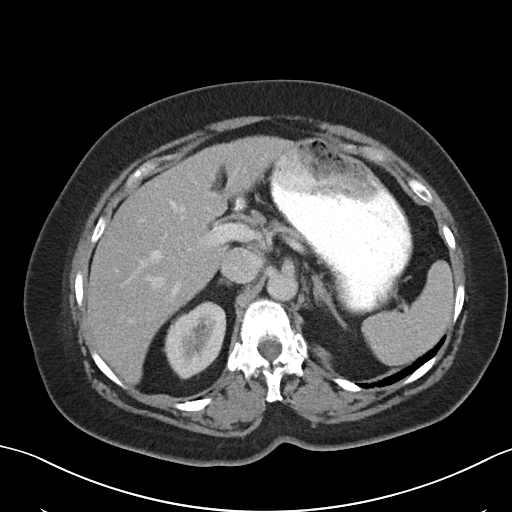
[im 76/91  soft-tissue]
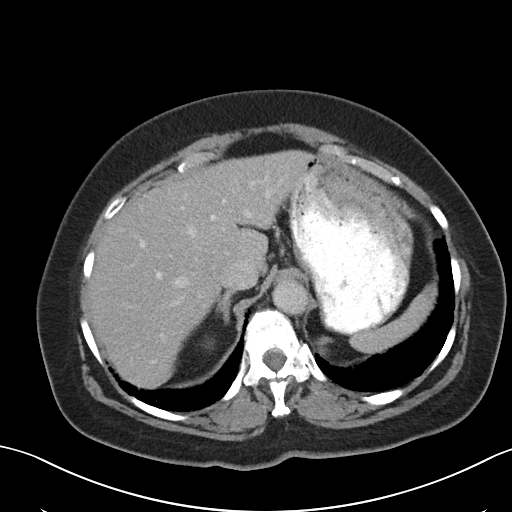
[im 86/91  soft-tissue]
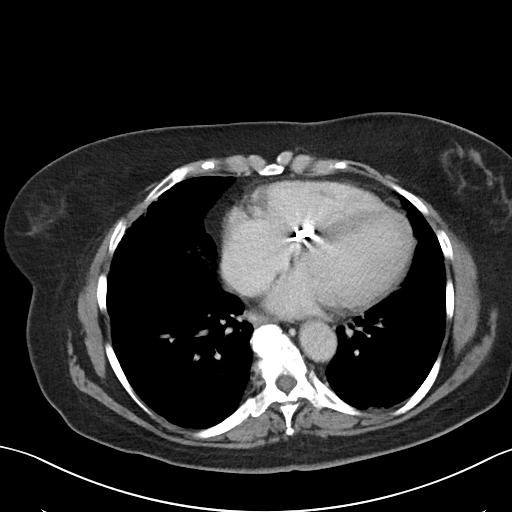

[Series 6: abd/pel w st · coronal · 0.72mm/px · 3 of 90 slices shown]
[im 30/90  soft-tissue]
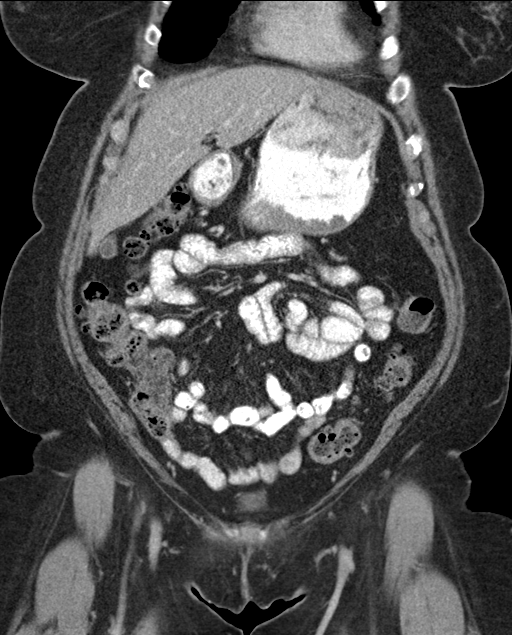
[im 40/90  soft-tissue]
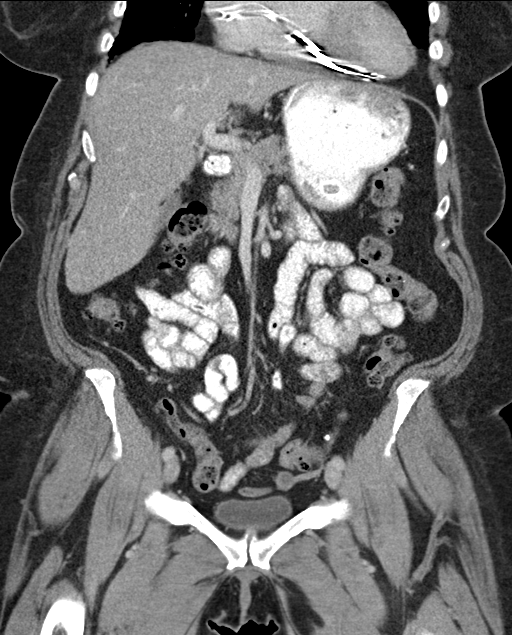
[im 50/90  soft-tissue]
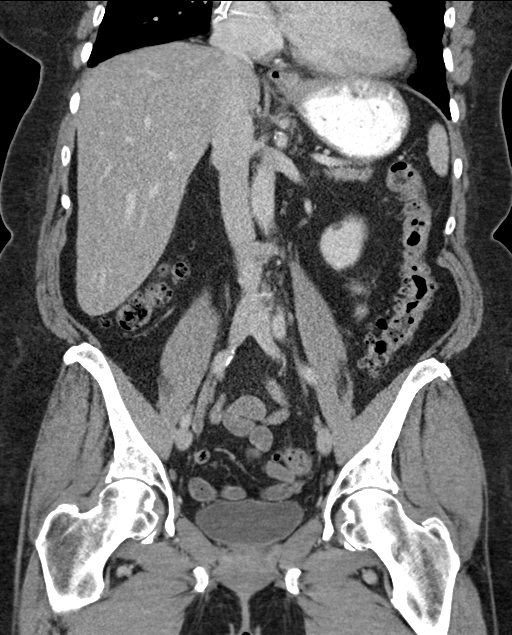

[15 of 46 positions shown; findings below may reference images not displayed]

FINDINGS: Lower chest: Bibasilar scarring changes and mild bronchiectasis
possibly suggesting interstitial lung disease. Findings appear
relatively stable. A high-resolution chest CT may be helpful for
further evaluation. No worrisome pulmonary lesions. The heart is
mildly enlarged. Pacer wires in good position without complicating
features. The distal esophagus is grossly normal.

Hepatobiliary: No focal hepatic lesions or intrahepatic biliary
dilatation. The gallbladder is unremarkable. No common bile duct
dilatation.

Pancreas: No mass, inflammation or ductal dilatation. Mild
pancreatic atrophy.

Spleen: Normal size.  No focal lesions.

Adrenals/Urinary Tract: Adrenal glands and kidneys are unremarkable.
Tiny scattered renal cysts and evidence of prior cortical scarring
changes. No renal, ureteral or bladder calculi or mass. Mild
cystocele noted.

Stomach/Bowel: The stomach, duodenum, small bowel and colon are
unremarkable. No acute inflammatory changes, mass lesions or
obstructive findings. The terminal ileum is normal. The appendix is
normal.

Diffuse colonic diverticulosis without definite findings for acute
diverticulitis. Infraumbilical abdominal wall hernia contains part
of the transverse colon. No findings for incarceration or
obstruction. This could be causing the patient's pain.

Vascular/Lymphatic: Mild atherosclerotic calcifications involving
the aorta and iliac arteries. No aneurysm or dissection. The branch
vessels are patent. The major venous structures are patent.

No mesenteric or retroperitoneal mass or adenopathy. Small scattered
lymph nodes are within normal limits.

Reproductive: Status post hysterectomy. Both ovaries are still
present. Adjacent to the right ovary and extending into the midline
of the cul-de-sac is a serpiginous appearing cystic structure which
is most likely a hydrosalpinx. No worrisome imaging features. A
follow-up pelvic ultrasound examination may be helpful to confirm
and follow this lesion. It measures a maximum of 7.0 x 5.8 cm.

Other: No inguinal mass or adenopathy. No worrisome subcutaneous
lesions.

Musculoskeletal: Advanced degenerative changes involving the spine
but no worrisome bone lesions.
IMPRESSION: 1. Infraumbilical abdominal wall hernia containing part of the
transverse colon. No findings for obstruction or incarceration but
this could be a source of the patient's midline abdominal pain.
2. Diffuse colonic diverticulosis without findings for acute
diverticulitis.
3. No acute abdominal/pelvic findings, mass lesions or
lymphadenopathy.
4. Serpiginous appearing cystic lesion in the pelvis closely
associated with the right ovary is suspicious for a hydrosalpinx.
Recommend ultrasound correlation.
5. Status post hysterectomy.  Mild cystocele.
6. Suspect interstitial lung disease with bronchiectasis at the lung
bases. A high-resolution chest CT for interstitial lung disease may
be helpful for further evaluation, particularly if the patient is
symptomatic.

## 2019-04-28 ENCOUNTER — Other Ambulatory Visit: Payer: Self-pay | Admitting: Pulmonary Disease

## 2019-04-28 ENCOUNTER — Telehealth: Payer: Self-pay | Admitting: Rheumatology

## 2019-04-28 MED ORDER — TRAMADOL HCL 50 MG PO TABS: 50.0000 mg | ORAL_TABLET | Freq: Every day | ORAL | 1 refills | Status: DC

## 2019-04-28 NOTE — Telephone Encounter (Signed)
Last Visit: 01/14/19 Next Visit: 07/09/19 UDS: 12/25/18 Narc Agreement: 12/25/18  Okay to refill Tramadol?

## 2019-04-28 NOTE — Telephone Encounter (Signed)
Patient called requesting prescription refill of Tramadol to be sent to [facility] at [address]

## 2019-04-30 ENCOUNTER — Other Ambulatory Visit: Payer: Self-pay | Admitting: Cardiology

## 2019-05-04 ENCOUNTER — Telehealth: Payer: Self-pay | Admitting: Family Medicine

## 2019-05-04 DIAGNOSIS — K760 Fatty (change of) liver, not elsewhere classified: Secondary | ICD-10-CM

## 2019-05-04 NOTE — Telephone Encounter (Signed)
-----   Message from Ellamae Sia sent at 04/28/2019 10:02 AM EDT ----- Regarding: Lab orders for Tuesday, 9.15.20 Lab orders

## 2019-05-05 ENCOUNTER — Other Ambulatory Visit (INDEPENDENT_AMBULATORY_CARE_PROVIDER_SITE_OTHER): Payer: Medicare Other

## 2019-05-05 DIAGNOSIS — K760 Fatty (change of) liver, not elsewhere classified: Secondary | ICD-10-CM | POA: Diagnosis not present

## 2019-05-05 LAB — HEPATIC FUNCTION PANEL
ALT: 42 U/L — ABNORMAL HIGH (ref 0–35)
AST: 34 U/L (ref 0–37)
Albumin: 3.9 g/dL (ref 3.5–5.2)
Alkaline Phosphatase: 94 U/L (ref 39–117)
Bilirubin, Direct: 0 mg/dL (ref 0.0–0.3)
Total Bilirubin: 0.4 mg/dL (ref 0.2–1.2)
Total Protein: 6.9 g/dL (ref 6.0–8.3)

## 2019-05-20 ENCOUNTER — Other Ambulatory Visit: Payer: Self-pay

## 2019-05-20 ENCOUNTER — Ambulatory Visit (INDEPENDENT_AMBULATORY_CARE_PROVIDER_SITE_OTHER): Payer: Medicare Other | Admitting: Pulmonary Disease

## 2019-05-20 ENCOUNTER — Encounter: Payer: Self-pay | Admitting: Pulmonary Disease

## 2019-05-20 VITALS — BP 126/78 | HR 63 | Temp 97.3°F | Ht 66.5 in | Wt 225.8 lb

## 2019-05-20 DIAGNOSIS — J309 Allergic rhinitis, unspecified: Secondary | ICD-10-CM

## 2019-05-20 DIAGNOSIS — G4733 Obstructive sleep apnea (adult) (pediatric): Secondary | ICD-10-CM

## 2019-05-20 DIAGNOSIS — Z9989 Dependence on other enabling machines and devices: Secondary | ICD-10-CM | POA: Diagnosis not present

## 2019-05-20 DIAGNOSIS — J479 Bronchiectasis, uncomplicated: Secondary | ICD-10-CM | POA: Diagnosis not present

## 2019-05-20 NOTE — Patient Instructions (Signed)
Can use spacer device with albuterol as needed  Follow up in 3 months

## 2019-05-20 NOTE — Progress Notes (Signed)
[facility]  Chief Complaint  Patient presents with  . Bronchiectasis without complication [facility])    Patient stated her breathing is better since last visit.    Constitutional:  BP 126/78 (BP Location: Right Arm, Patient Position: Sitting, Cuff Size: Normal)   Pulse 63   Temp (!) 97.3 F (36.3 C)   Ht 5' 6.5" (1.689 m)   Wt 225 lb 12.8 oz (102.4 kg)   LMP  (LMP Unknown)   SpO2 97% Comment: on room air  BMI 35.90 kg/m   Past Medical History:  Vit D deficiency, Takotsubo CM, Schatzki's ring, PUD, VT s/p AICD, PE, NASH, ASD, Hypothyroidism, HTN, HLD, HH, GERD, Fibromyalgia, Diverticulosis, Depression, C diff, Cerebral aneurysm, Breast cancer 2016, OA  Brief Summary:  [name] is a 68 y.o. female former smoker with bronchiectasis and obstructive sleep apnea.  Breathing is better.  Not having much congestion in her chest.  Still has intermittent cough, but seems like this is coming from post nasal drip.  Denies chest pain, wheeze, fever, or hemoptysis.  Has been getting discomfort in her lower abdomen.  Hasn't contacted Dr. [name] yet to schedule surgery.  Has appointment with Dr. Hilarie Fredrickson in October.  Physical Exam:   Appearance - well kempt   ENMT - no sinus tenderness, no nasal discharge, no oral exudate  Neck - no masses, trachea midline, no thyromegaly, no elevation in JVP  Respiratory - normal appearance of chest wall, normal respiratory effort w/o accessory muscle use, no dullness on percussion, no wheezing or rales  CV - s1s2 regular rate and rhythm, no murmurs, no peripheral edema, radial pulses symmetric  GI - soft, non tender  Lymph - no adenopathy noted in neck and axillary areas  MSK - normal gait  Ext - no cyanosis, clubbing, or joint inflammation noted  Skin - no rashes, lesions, or ulcers  Neuro - normal strength, oriented x 3  Psych - normal mood and affect   Assessment/Plan:   Bronchiectasis. - prn  albuterol with spacer device - prn mucinex, flutter valve, chest vest >> discussed indication when to use these - don't think she needs rotating ABx at this time >> discussed symptoms/signs to monitor for that would indicated need for ABx  Upper airway cough syndrome with allergic rhinitis. - current symptoms related to this - she uses Navage for nasal irrigation - continue allegra, flonase, singulair  Obstructive sleep apnea. - she is compliant with CPAP and reports benefit - continue CPAP 8 cm H2O  Ventral hernia with lower abdominal discomfort. - she will f/u with Dr. Hilarie Fredrickson and Dr. [name]   Patient Instructions  Can use spacer device with albuterol as needed  Follow up in 3 months   Chesley Mires, MD [place] Pager: [phone] 05/20/2019, 11:39 AM  Flow Sheet     Pulmonary tests:  PFT 04/17/12 >> FEV1 2.60 (110%), FEV1% 85, TLC 4.05 (77%), DLCO 44% PFT 01/17/17 >> FEV1 2.38 (111%), FEV1% 90, TLC 3.85 (71%), DLCO 58%, no BD RAST 05/30/17 >> dust mites PFT 10/16/18 >> FEV1 2.27 (111%), FEV1% 90, TLC 4.00 (74%), DLCO 59%  Serology:  04/02/12 >> ACE 1 10/25/15 >> ESR 40, ACE 32 05/30/17 >> IgA, IgG, IgM normal 10/16/18 >> HP panel negative, ACE 25  Chest imaging:  CT chest 04/10/12 >> b/l lower lobe cylindrical BTX and some in upper lobes CT angio chest 10/26/15 >> patchy GGO in periphery of lungs b/l, basilar BTX, no PE;  no significant change compared to 2013 HRCT chest 10/08/18 >> atherosclerosis, basilar predominant cylindrical BTX, mild centrilobular/paraseptal emphysema, air trapping CT angio chest 03/24/19 >> Chronic lung changes with peribronchial thickening, bibasilar atelectasis and chronic interstitial disease/peripheral fibrosis at the lung bases  Sleep tests:  PSG 11/29/08 >> AHI 9 HST 11/10/15 >> AHI 7.1, SaO2 low 75% CPAP 04/19/19 to 05/18/19 >> used on 30 of 30 nights with average 6 hrs 49 min.  Average AHI 4 with CPAP 8 cm H2O.  Cardiac  tests:  Echo9/24/18 >> mild LVH, EF 55 to 60%, grade 1 DD, mild TR  Medications:   Allergies as of 05/20/2019      Reactions   Ace Inhibitors Swelling   Angioedema; makes tongue "break out"   Dilantin [phenytoin Sodium Extended] Rash   Severe rash   Atorvastatin Other (See Comments)   SEVERE MYALGIA   Ramipril Other (See Comments)   TONGUE ULCERS   Rosuvastatin Other (See Comments)   SEVERE MYALGIA   Cymbalta [duloxetine Hcl]    Sleepiness/ sick   Iodine-131 Other (See Comments)   Swelling at IV site only, no swelling or SOB around mouth.!   Paroxetine Nausea Only   Rapid heartbeat   Wellbutrin [bupropion]    Palpitations    Amitriptyline Hcl Other (See Comments)    makes her too sleepy!   Carbamazepine Rash   Codeine Itching   Phenytoin Sodium Extended Rash      Medication List       Accurate as of May 20, 2019 11:39 AM. If you have any questions, ask your nurse or doctor.        acetaminophen 500 MG tablet Commonly known as: TYLENOL Take 500 mg by mouth every 6 (six) hours as needed (pain).   albuterol (2.5 MG/3ML) 0.083% nebulizer solution Commonly known as: PROVENTIL Take 3 mLs (2.5 mg total) by nebulization every 6 (six) hours as needed for wheezing or shortness of breath.   albuterol 108 (90 Base) MCG/ACT inhaler Commonly known as: Ventolin HFA Inhale 2 puffs into the lungs every 6 (six) hours as needed for wheezing or shortness of breath.   aspirin 81 MG tablet Take 81 mg by mouth daily.   baclofen 10 MG tablet Commonly known as: LIORESAL TAKE 1 TABLET THREE TIMES DAILY What changed:   how to take this  when to take this  additional instructions   carvedilol 12.5 MG tablet Commonly known as: COREG Take 1 tablet (12.5 mg total) by mouth 2 (two) times daily.   cholecalciferol 1000 units tablet Commonly known as: VITAMIN D Take 5,000 Units by mouth daily.   fexofenadine 180 MG tablet Commonly known as: ALLEGRA Take 180 mg by mouth  daily as needed for allergies or rhinitis.   fluticasone 50 MCG/ACT nasal spray Commonly known as: FLONASE Place 2 sprays into both nostrils daily as needed for allergies or rhinitis.   Flutter Devi 1 Device by Does not apply route as needed.   furosemide 40 MG tablet Commonly known as: Lasix Take 1 tablet (40 mg total) by mouth every other day.   hydroxypropyl methylcellulose / hypromellose 2.5 % ophthalmic solution Commonly known as: ISOPTO TEARS / GONIOVISC Place 1 drop into both eyes daily.   latanoprost 0.005 % ophthalmic solution Commonly known as: XALATAN Place 1 drop into both eyes at bedtime.   levothyroxine 50 MCG tablet Commonly known as: SYNTHROID Take 1 tablet (50 mcg total) by mouth daily.   Magnesium Oxide 400 (240 Mg) MG  Tabs Take 1 tablet (400 mg total) by mouth daily.   mometasone 0.1 % lotion Commonly known as: ELOCON Apply 1 application topically daily as needed (rash).   montelukast 10 MG tablet Commonly known as: SINGULAIR TAKE 1 TABLET AT BEDTIME   MULTIVITAMIN PO Take 1 tablet by mouth daily.   pantoprazole 40 MG tablet Commonly known as: PROTONIX Take 1 tablet (40 mg total) by mouth 2 (two) times daily before a meal.   polyethylene glycol powder 17 GM/SCOOP powder Commonly known as: GLYCOLAX/MIRALAX Take 17 grams by mouth daily   potassium chloride 10 MEQ tablet Commonly known as: KLOR-CON TAKE 1/2 TABLET (5MEQ TOTAL) EVERY DAY   simvastatin 40 MG tablet Commonly known as: ZOCOR TAKE 1/2 TABLET (20MG) EVERY DAY   traMADol 50 MG tablet Commonly known as: ULTRAM Take 1 tablet (50 mg total) by mouth at bedtime.   zolpidem 10 MG tablet Commonly known as: AMBIEN TAKE 1 TABLET BY MOUTH AT BEDTIME AS NEEDED FOR SLEEP       Past Surgical History:  She  has a past surgical history that includes Partial hysterectomy; Cerebral aneurysm repair (02/1999); Knee arthroscopy; Cardiac defibrillator placement (2006; 2012); carotid dopplers  (2006); hospitalization (2004); hospitalization; ABI (2006); left heart catheterization with coronary angiogram (N/A, 02/22/2012); Breast lumpectomy with radioactive seed localization (Right, 01/03/2015); Total knee arthroplasty (Left, 02/13/2016); Breast excisional biopsy; Tongue Biopsy (12/12/2017); Colonoscopy; and Upper gastrointestinal endoscopy.  Family History:  Her family history includes Allergies in her sister; Alzheimer's disease in her mother; Breast cancer in her cousin and maternal aunt; Breast cancer (age of onset: 87) in her maternal aunt; Cancer (age of onset: 57) in her maternal grandmother; Cardiomyopathy in an other family member; Colon cancer (age of onset: 55) in her maternal aunt; Diabetes in her mother; Heart disease in her brother, father, sister, and son; Lung cancer in her maternal uncle; Lung cancer (age of onset: 29) in her maternal grandfather; Other in her brother; Pancreatic cancer (age of onset: 50) in her cousin; Prostate cancer (age of onset: 29) in her brother; Throat cancer in her brother.  Social History:  She  reports that she quit smoking about 29 years ago. Her smoking use included cigarettes. She has a 15.00 pack-year smoking history. She has never used smokeless tobacco. She reports current alcohol use. She reports that she does not use drugs.

## 2019-05-26 ENCOUNTER — Other Ambulatory Visit: Payer: Self-pay | Admitting: Family Medicine

## 2019-05-26 NOTE — Telephone Encounter (Signed)
Name of Medication: Ambien Name of Pharmacy: [facility] or Written Date and Quantity: 11/24/18 #90 tabs with 1 refill Last Office Visit and Type: 12/09/18 F/U Next Office Visit and Type: none scheduled  Last Controlled Substance Agreement Date: 11/30/16 Last UDS: 06/01/17

## 2019-05-29 ENCOUNTER — Ambulatory Visit (INDEPENDENT_AMBULATORY_CARE_PROVIDER_SITE_OTHER): Payer: Medicare Other | Admitting: Nurse Practitioner

## 2019-05-29 ENCOUNTER — Encounter: Payer: Self-pay | Admitting: Nurse Practitioner

## 2019-05-29 VITALS — BP 120/80 | HR 67 | Temp 96.8°F | Ht 66.0 in | Wt 224.0 lb

## 2019-05-29 DIAGNOSIS — R109 Unspecified abdominal pain: Secondary | ICD-10-CM

## 2019-05-29 DIAGNOSIS — K22719 Barrett's esophagus with dysplasia, unspecified: Secondary | ICD-10-CM | POA: Diagnosis not present

## 2019-05-29 MED ORDER — DICYCLOMINE HCL 10 MG PO CAPS: 10.0000 mg | ORAL_CAPSULE | Freq: Two times a day (BID) | ORAL | 0 refills | Status: DC

## 2019-05-29 NOTE — Patient Instructions (Signed)
If you are age 70 or older, your body mass index should be between 23-30. Your Body mass index is 36.15 kg/m. If this is out of the aforementioned range listed, please consider follow up with your Primary Care Provider.  If you are age 30 or younger, your body mass index should be between 19-25. Your Body mass index is 36.15 kg/m. If this is out of the aformentioned range listed, please consider follow up with your Primary Care Provider.   You have been scheduled for an endoscopy. Please follow written instructions given to you at your visit today. If you use inhalers (even only as needed), please bring them with you on the day of your procedure. Your physician has requested that you go to www.startemmi.com and enter the access code given to you at your visit today. This web site gives a general overview about your procedure. However, you should still follow specific instructions given to you by our office regarding your preparation for the procedure.  Call Opthalmologist to see if okay to take Bentyl 10 mg twice daily.  Thank you for choosing me and [facility].   Tye Savoy, NP

## 2019-05-29 NOTE — Progress Notes (Signed)
Chief Complaint:  Abdominal pain    IMPRESSION and PLAN:     58. 68 yo female with multiple medical problems here with chronic right mid abdominal pain.  Known umbilical hernia containing transverse colon on CT AP in late June. Declined surgery - saw Dr. [name]. No obstructive symptoms. Etiology of the RIGHT MID abdominal pain is unclear, seems unlikely related to transverse colon containing umbilical hernia. Same CT scan also showed a 7.0 x 5.8 cm cystic structure adjacent to right ovary but pain is higher up in the mid abdomen. She is followed by GYN. There does seem to have a musculoskeletal component in that pain about constant, worse with bending / twisting.  -Pain doesn't seem GI in nature but trial of Bentyl is reasonable. She has glaucoma but called Ophthalmologist before leaving out office and was given the okay to try Bentyl.    2. Gastric metaplasia, due for 3 year surveillance EGD.  The risks and benefits of EGD were discussed and the patient agrees to proceed.    HPI:     Patient is a 68 yo female with PMH significant for HTN, V-tach s/p ICD, sleep apnea, breast cancer, fibromyalgia, GERD, hiatal hernia, atrophic gastritis, adenomatous colon polyps, C-difficile infection. She is known to Dr. Hilarie Fredrickson, last visit via Telemedicine on 02/02/19 for evaluation of abdominal pain. She has known umbilical hernia containing transverse colon and periodically has periumbilical pain. She saw [facility]- Dr. [name] but was scared to proceed with hernia repair. Alvie also gets right mid abdominal pain sometimes, she even reported this at time of TeleMedicine visit. The pain nearly constant though tends to be worse at night. It is not related to eating. She does feel the band when bending over and so a lesser degree when twisting. Pain is not relieved with defecation, bowels moving well with miralax. Her weight is up about 10 pounds from late May.   Stepheni has gastric metaplasia, last surveillance EGD was  in 2017. She is due for repeat EGD.    Data Reviewed:  EGD 01/04/19 - follow up gastric metaplasia The lower third of the esophagus was mildly tortuous. - A non-obstructing Schatzki ring (acquired) was found at the gastroesophageal junction. - A 3 cm hiatal hernia was present. - Localized minimal inflammation was found in the gastric antrum. Multiple sampling biopsies were obtained with cold forceps for histology in the gastric antrum. - The cardia, gastric fundus and gastric body were normal. Multiple sampling biopsies were obtained with cold forceps for histology in the gastric body. - The examined duodenum was normal.  Diagnosis 1. Surgical [P], gastric antrum - BENIGN ANTRAL GASTRIC MUCOSA WITH MILD CHRONIC INFLAMMATION AND REACTIVE CHANGES. - WARTHIN-STARRY STAIN IS NEGATIVE FOR HELICOBACTER PYLORI. - NO INTESTINAL METAPLASIA, DYSPLASIA, OR MALIGNANCY. - SEE COMMENT. 2. Surgical [P], gastric body - OXYNTIC GASTRIC MUCOSA SHOWING CHRONIC ATROPHIC GASTRITIS WITH INTESTINAL METAPLASIA. - WARTHIN-STARRY STAIN IS NEGATIVE FOR HELICOBACTER PYLORI. - NO DYSPLASIA OR MALIGNANCY IDENTIFIED. Microscopic Comment 1. There are mild reactive changes within the antral gastric mucosa which are suggestive of reactive gastropathic effect (reactive gastropathy). Please correlate with clinical and endoscopic impression.   Review of systems:     No chest pain, no SOB, no fevers, no urinary sx   Past Medical History:  Diagnosis Date  . AICD (automatic cardioverter/defibrillator) present   . Anxiety   . Arthritis   . 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  Cerebral aneurysm, nonruptured    had a clip put in  . Clostridium difficile infection   . Depressive disorder, not elsewhere classified   . Diverticulosis of colon (without mention of hemorrhage)   . Esophageal reflux   . Fibromyalgia   . Gastritis   . GERD (gastroesophageal reflux disease)   . GI bleed 2004   . Hiatal hernia   . Hyperlipidemia   . Hypertension   . Hypothyroidism   . Internal hemorrhoids   . Obstructive sleep apnea (adult) (pediatric)   . Ostium secundum type atrial septal defect   . Other chronic nonalcoholic liver disease   . Other pulmonary embolism and infarction   . Paroxysmal ventricular tachycardia ([place])   . Personal history of radiation therapy   . Pneumonia    history  . Presence of permanent cardiac pacemaker   . PUD (peptic ulcer disease)   . Radiation 02/03/15-03/10/15   Right Breast  . Sarcoid    per pt , not sure  . Schatzki's ring   . Stroke [facility]) 2013   tia/ pt feels it was around 2008 0r 2009  . Takotsubo syndrome   . Tubular adenoma of colon   . Unspecified transient cerebral ischemia   . Unspecified vitamin D deficiency     Patient's surgical history, family medical history, social history, medications and allergies were all reviewed in Epic   Current Outpatient Medications  Medication Sig Dispense Refill  . acetaminophen (TYLENOL) 500 MG tablet Take 500 mg by mouth every 6 (six) hours as needed (pain).     [name] albuterol (PROVENTIL) (2.5 MG/3ML) 0.083% nebulizer solution Take 3 mLs (2.5 mg total) by nebulization every 6 (six) hours as needed for wheezing or shortness of breath. 75 mL 12  . albuterol (VENTOLIN HFA) 108 (90 Base) MCG/ACT inhaler Inhale 2 puffs into the lungs every 6 (six) hours as needed for wheezing or shortness of breath. 18 g 3  . Ascorbic Acid (VITAMIN C) 1000 MG tablet Take 1,000 mg by mouth daily.    [name] aspirin 81 MG tablet Take 81 mg by mouth daily.    . baclofen (LIORESAL) 10 MG tablet TAKE 1 TABLET THREE TIMES DAILY (Patient taking differently: 10 mg. 1 daily) 270 tablet 0  . calcium carbonate (OS-CAL - DOSED IN MG OF ELEMENTAL CALCIUM) 1250 (500 Ca) MG tablet Take 1 tablet by mouth.    . carvedilol (COREG) 12.5 MG tablet Take 1 tablet (12.5 mg total) by mouth 2 (two) times daily. 90 tablet 3  . cholecalciferol (VITAMIN D)  1000 UNITS tablet Take 5,000 Units by mouth daily.     . fexofenadine (ALLEGRA) 180 MG tablet Take 180 mg by mouth daily as needed for allergies or rhinitis.    . fluticasone (FLONASE) 50 MCG/ACT nasal spray Place 2 sprays into both nostrils daily as needed for allergies or rhinitis. 48 g 3  . furosemide (LASIX) 40 MG tablet Take 1 tablet (40 mg total) by mouth every other day. 45 tablet 3  . hydroxypropyl methylcellulose / hypromellose (ISOPTO TEARS / GONIOVISC) 2.5 % ophthalmic solution Place 1 drop into both eyes daily.    [name] latanoprost (XALATAN) 0.005 % ophthalmic solution Place 1 drop into both eyes at bedtime.    [name] levothyroxine (SYNTHROID) 50 MCG tablet Take 1 tablet (50 mcg total) by mouth daily. 90 tablet 3  . Magnesium Oxide 400 (240 Mg) MG TABS Take 1 tablet (400 mg total) by mouth daily. 90 tablet 3  .  mometasone (ELOCON) 0.1 % lotion Apply 1 application topically daily as needed (rash).     . montelukast (SINGULAIR) 10 MG tablet TAKE 1 TABLET AT BEDTIME 90 tablet 3  . Multiple Vitamins-Minerals (MULTIVITAMIN PO) Take 1 tablet by mouth daily.    . polyethylene glycol powder (GLYCOLAX/MIRALAX) powder Take 17 grams by mouth daily 1080 g 0  . potassium chloride (K-DUR) 10 MEQ tablet TAKE 1/2 TABLET (5MEQ TOTAL) EVERY DAY 45 tablet 3  . Respiratory Therapy Supplies (FLUTTER) DEVI 1 Device by Does not apply route as needed. 1 each 0  . simvastatin (ZOCOR) 40 MG tablet TAKE 1/2 TABLET (20MG ) EVERY DAY 45 tablet 2  . traMADol (ULTRAM) 50 MG tablet Take 1 tablet (50 mg total) by mouth at bedtime. 30 tablet 1  . zinc gluconate 50 MG tablet Take 50 mg by mouth daily.    [name] zolpidem (AMBIEN) 10 MG tablet TAKE 1 TABLET BY MOUTH AT BEDTIME AS NEEDED FOR SLEEP 90 tablet 1  . pantoprazole (PROTONIX) 40 MG tablet Take 1 tablet (40 mg total) by mouth 2 (two) times daily before a meal. (Patient not taking: Reported on 05/29/2019) 180 tablet 1   No current facility-administered medications for this visit.      Physical Exam:     BP 120/80   Pulse 67   Temp (!) 96.8 F (36 C)   Ht 5\' 6"  (1.676 m)   Wt 224 lb (101.6 kg)   LMP  (LMP Unknown)   BMI 36.15 kg/m   GENERAL:  Pleasant female in NAD PSYCH: : Cooperative, normal affect EENT:  conjunctiva pink, mucous membranes moist, neck supple without masses CARDIAC:  RRR, murmur heard, no peripheral edema PULM: Normal respiratory effort, lungs CTA bilaterally, no wheezing ABDOMEN:  Nondistended, soft, nontender. No obvious masses, no hepatomegaly,  normal bowel sounds SKIN:  turgor, no lesions seen Musculoskeletal:  Normal muscle tone, normal strength NEURO: Alert and oriented x 3, no focal neurologic deficits   Tye Savoy , NP 05/29/2019, 11:39 AM

## 2019-06-01 ENCOUNTER — Encounter: Payer: Self-pay | Admitting: Nurse Practitioner

## 2019-06-03 ENCOUNTER — Ambulatory Visit (INDEPENDENT_AMBULATORY_CARE_PROVIDER_SITE_OTHER): Payer: Medicare Other | Admitting: *Deleted

## 2019-06-03 DIAGNOSIS — I428 Other cardiomyopathies: Secondary | ICD-10-CM

## 2019-06-03 DIAGNOSIS — I4729 Other ventricular tachycardia: Secondary | ICD-10-CM

## 2019-06-03 DIAGNOSIS — I472 Ventricular tachycardia: Secondary | ICD-10-CM

## 2019-06-03 LAB — CUP PACEART REMOTE DEVICE CHECK
Battery Remaining Longevity: 48 mo
Battery Remaining Percentage: 52 %
Brady Statistic RV Percent Paced: 0 %
Date Time Interrogation Session: 20201014070100
HighPow Impedance: 71 Ohm
Implantable Lead Implant Date: 20050603
Implantable Lead Location: 753860
Implantable Lead Model: 185
Implantable Lead Serial Number: 116340
Implantable Pulse Generator Implant Date: 20110505
Lead Channel Impedance Value: 655 Ohm
Lead Channel Pacing Threshold Amplitude: 0.7 V
Lead Channel Pacing Threshold Pulse Width: 0.4 ms
Lead Channel Setting Pacing Amplitude: 2.4 V
Lead Channel Setting Pacing Pulse Width: 0.4 ms
Lead Channel Setting Sensing Sensitivity: 0.4 mV
Pulse Gen Serial Number: 266301

## 2019-06-03 NOTE — Progress Notes (Signed)
Office Visit Note  Patient: [name]             Date of Birth: 03-02-1951           MRN: [id number]             PCP: Abner Greenspan, MD Referring: Tower, [name], MD Visit Date: 06/09/2019 Occupation: @GUAROCC @  Subjective:  Generalized pain   History of Present Illness: [name] is a 68 y.o. female with history of fibromyalgia, DDD, and osteoarthritis.  She continues to have generalized muscle aches and muscle tenderness.  She has left trapezius muscle tension and tenderness.   She continues to have right trochanteric bursitis, but she has not been performing any stretching exercises recently.  She states she has been more sedentary recently, which has caused increased fatigue and weight gain.  She states she has a chronic elevation of LFTs and has had to stop taking tylenol.  She has been taking tramadol 50 mg 1 tablet at bedtime but has had to take it during the day due to increased pain.  She continues to have interrupted sleep at night despite taking ambien as prescribed.   She uses a CPAP and will be having adjustments made soon.  She has chronic right knee joint pain, but she does not want to proceed with a right knee replacement at this time.  She states the left knee replacement is doing well.     Activities of Daily Living:  Patient reports morning stiffness for 5-10  minutes.   Patient Reports nocturnal pain.  Difficulty dressing/grooming: Denies Difficulty climbing stairs: Denies Difficulty getting out of chair: Denies Difficulty using hands for taps, buttons, cutlery, and/or writing: Denies  Review of Systems  Constitutional: Positive for fatigue.  HENT: Positive for mouth dryness. Negative for mouth sores and nose dryness.   Eyes: Negative for pain, visual disturbance and dryness.  Respiratory: Negative for cough, hemoptysis, shortness of breath and difficulty breathing.   Cardiovascular: Negative for chest pain, palpitations, hypertension and swelling in  legs/feet.  Gastrointestinal: Negative for blood in stool, constipation and diarrhea.  Endocrine: Negative for increased urination.  Genitourinary: Negative for painful urination.  Musculoskeletal: Positive for arthralgias, joint pain, myalgias, morning stiffness, muscle tenderness and myalgias. Negative for joint swelling and muscle weakness.  Skin: Negative for color change, pallor, rash, hair loss, nodules/bumps, skin tightness, ulcers and sensitivity to sunlight.  Allergic/Immunologic: Negative for susceptible to infections.  Neurological: Negative for dizziness, numbness, headaches and weakness.  Hematological: Negative for swollen glands.  Psychiatric/Behavioral: Positive for depressed mood and sleep disturbance (Ambien ). The patient is not nervous/anxious.     PMFS History:  Patient Active Problem List   Diagnosis Date Noted  . Pre-op exam 01/21/2018  . Neck pain 10/25/2017  . Paresthesias in left hand 10/25/2017  . Paresthesia of foot, bilateral 04/12/2017  . Primary osteoarthritis of both hands 09/27/2016  . Primary osteoarthritis of both knees 09/27/2016  . Urinary frequency 09/26/2016  . Screen for STD (sexually transmitted disease) 09/26/2016  . 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  . Nasopharyngitis, 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/2016  . Postnasal drip 05/18/2016  . S/P total knee replacement 02/13/2016  . Chronic pain of both knees 12/06/2015  . Globus pharyngeus 05/17/2015  . Productive cough 05/17/2015  . Genetic testing 12/23/2014  . Family history of breast cancer   . Family history of colon cancer   . Family history of pancreatic cancer   . Breast cancer of upper-outer quadrant  of right female breast ([place]) 11/23/2014  . Allergy to ACE inhibitors 09/27/2014  . Encounter for Medicare annual wellness exam 06/27/2014  . Prediabetes 06/23/2013  . Routine general medical examination at a health care facility 06/23/2013  . RLS (restless legs syndrome) 01/15/2013  . Hx of  Clostridium difficile infection 10/10/2012  . Bronchiectasis with acute exacerbation ([place]) 04/29/2012  . Dyspnea on exertion 04/02/2012  . Non-ischemic cardiomyopathy ([place]) 03/07/2012  . Colon cancer screening 01/11/2012  . Post-menopausal 01/11/2012  . Chest pain 11/07/2011  . Palpitations 04/17/2011  . Abnormal tympanic membrane 01/22/2011  . Obstructive sleep apnea 04/04/2010  . VENTRICULAR TACHYCARDIA 01/03/2009  . ICD  [employer]  Single chamber 01/03/2009  . PATENT FORAMEN OVALE 09/29/2008  . Vitamin D deficiency 08/17/2008  . Hypothyroidism 11/18/2006  . Hyperlipidemia 11/18/2006  . Depression with anxiety 11/18/2006  . Essential hypertension 11/18/2006  . MITRAL VALVE PROLAPSE 11/18/2006  . CEREBRAL ANEURYSM 11/18/2006  . Allergic rhinitis 11/18/2006  . GERD 11/18/2006  . DIVERTICULOSIS, COLON 11/18/2006  . Fatty liver 11/18/2006  . Fibromyalgia 11/18/2006    Past Medical History:  Diagnosis Date  . AICD (automatic cardioverter/defibrillator) present   . Anxiety   . Arthritis   . 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 Cerebral aneurysm, nonruptured    had a clip put in  . Clostridium difficile infection   . Depressive disorder, not elsewhere classified   . Diverticulosis of colon (without mention of hemorrhage)   . Esophageal reflux   . Fibromyalgia   . Gastritis   . GERD (gastroesophageal reflux disease)   . GI bleed 2004  . Hiatal hernia   . Hyperlipidemia   . Hypertension   . Hypothyroidism   . Internal hemorrhoids   . Obstructive sleep apnea (adult) (pediatric)   . Ostium secundum type atrial septal defect   . Other chronic nonalcoholic liver disease   . Other pulmonary embolism and infarction   . Paroxysmal ventricular tachycardia ([place])   . Personal history of radiation therapy   . Pneumonia    history  . Presence of permanent cardiac pacemaker   . PUD (peptic ulcer disease)   . Radiation 02/03/15-03/10/15    Right Breast  . Sarcoid    per pt , not sure  . Schatzki's ring   . Stroke [facility]) 2013   tia/ pt feels it was around 2008 0r 2009  . Takotsubo syndrome   . Tubular adenoma of colon   . Unspecified transient cerebral ischemia   . Unspecified vitamin D deficiency     Family History  Problem Relation Age of Onset  . Diabetes Mother   . Alzheimer's disease Mother   . Other Brother        Thyroid problem 10/2016  . Allergies Sister   . Heart disease Sister   . Heart disease Brother   . Prostate cancer Brother 41       same brother as throat cancer  . Throat cancer Brother        dx in his 37s; also a smoker  . Heart disease Father   . Cancer Maternal Grandmother 74       colon cancer or abdominal cancer  . Lung cancer Maternal Grandfather 75  . Breast cancer Maternal Aunt 40  . Colon cancer Maternal Aunt 38       same sister as breast at 85  . Lung cancer Maternal Uncle   . Breast cancer  Maternal Aunt        dx in her 104s  . Pancreatic cancer Cousin 6       maternal first cousin  . Breast cancer Cousin        paternal first cousin twice removed died in her 74s  . Cardiomyopathy Other        Family history  . Heart disease Son        Cardiac Arrest 07/2016  . Stroke Neg Hx    Past Surgical History:  Procedure Laterality Date  . ABI  2006   normal  . BREAST EXCISIONAL BIOPSY    . BREAST LUMPECTOMY WITH RADIOACTIVE SEED LOCALIZATION Right 01/03/2015   Procedure: BREAST LUMPECTOMY WITH RADIOACTIVE SEED LOCALIZATION;  Surgeon: Autumn Messing III, MD;  Location: [place];  Service: General;  Laterality: Right;  . CARDIAC DEFIBRILLATOR PLACEMENT  2006; 2012   BSX single chamber ICD  . carotid dopplers  2006   neg  . CEREBRAL ANEURYSM REPAIR  02/1999  . COLONOSCOPY    . hospitalization  2004   GI bleed, PUD, diverticulosis (EGD,colonscopy)  . hospitalization     PE, NSVT, s/p defib  . KNEE ARTHROSCOPY     bilateral  . LEFT HEART CATHETERIZATION WITH CORONARY ANGIOGRAM N/A  02/22/2012   Procedure: LEFT HEART CATHETERIZATION WITH CORONARY ANGIOGRAM;  Surgeon: Burnell Blanks, MD;  Location: [facility] CATH LAB;  Service: Cardiovascular;  Laterality: N/A;  . PARTIAL HYSTERECTOMY     Fibroids  . TONGUE BIOPSY  12/12/2017   due to sore tongue and white patches/abnormal cells  . TOTAL KNEE ARTHROPLASTY Left 02/13/2016   Procedure: TOTAL KNEE ARTHROPLASTY;  Surgeon: Vickey Huger, MD;  Location: [place];  Service: Orthopedics;  Laterality: Left;  . UPPER GASTROINTESTINAL ENDOSCOPY     Social History   Social History Narrative   Lives alone   Caffeine [id number]   Retired from [employer]   2 children   Divorced   Right handed    Immunization History  Administered Date(s) Administered  . Fluad Quad(high Dose 65+) 04/17/2019  . H1N1 07/24/2008  . Influenza Split 07/17/2011, 09/27/2011, 04/29/2012  . Influenza Whole 07/20/2004, 07/04/2007, 05/07/2008, 05/19/2009, 05/17/2010  . Influenza, High Dose Seasonal PF 05/30/2017, 05/21/2018  . Influenza,inj,Quad PF,6+ Mos 05/20/2013, 05/06/2014, 05/17/2015, 05/15/2016  . Influenza-Unspecified 05/07/2018  . Pneumococcal Conjugate-13 11/22/2015  . Pneumococcal Polysaccharide-23 05/20/2002, 05/07/2008, 06/25/2014  . Td 09/07/2009  . Zoster 09/21/2011     Objective: Vital Signs: BP 127/72 (BP Location: Left Wrist, Patient Position: Sitting, Cuff Size: Normal)   Pulse (!) 59   Resp 14   Ht 5' 6.5" (1.689 m)   Wt 226 lb 9.6 oz (102.8 kg)   LMP  (LMP Unknown)   BMI 36.03 kg/m    Physical Exam Vitals signs and nursing note reviewed.  Constitutional:      Appearance: She is well-developed.  HENT:     Head: Normocephalic and atraumatic.  Eyes:     Conjunctiva/sclera: Conjunctivae normal.  Neck:     Musculoskeletal: Normal range of motion.  Cardiovascular:     Rate and Rhythm: Normal rate and regular rhythm.     Heart sounds: Normal heart sounds.  Pulmonary:     Effort: Pulmonary effort is normal.     Breath sounds:  Normal breath sounds.  Abdominal:     General: Bowel sounds are normal.     Palpations: Abdomen is soft.  Lymphadenopathy:     Cervical: No cervical adenopathy.  Skin:  General: Skin is warm and dry.     Capillary Refill: Capillary refill takes less than 2 seconds.  Neurological:     Mental Status: She is alert and oriented to person, place, and time.  Psychiatric:        Behavior: Behavior normal.      Musculoskeletal Exam:C-spine limited ROM with lateral rotation.  Left trapezius muscle tension and tenderness.  Thoracic and lumbar spine good ROM.  No midline spinal tenderness.  No SI joint tenderness.  Shoulder joints, elbow joints, wrist joints, MCPs, PIPs, and DIPs good ROM with no synovitis.  Complete fist formation bilaterally.  Right knee warmth but no effusion.  Left knee replacement has good ROM with no discomfort.  Ankle joints good ROM with no tenderness or inflammation.    CDAI Exam: CDAI Score: - Patient Global: -; Provider Global: - Swollen: -; Tender: - Joint Exam   No joint exam has been documented for this visit   There is currently no information documented on the homunculus. Go to the Rheumatology activity and complete the homunculus joint exam.  Investigation: No additional findings.  Imaging: No results found.  Recent Labs: Lab Results  Component Value Date   WBC 4.5 03/17/2019   HGB 13.0 03/17/2019   PLT 210 03/17/2019   NA 140 03/17/2019   K 4.7 03/17/2019   CL 102 03/17/2019   CO2 24 03/17/2019   GLUCOSE 85 03/17/2019   BUN 10 03/17/2019   CREATININE 0.85 03/17/2019   BILITOT 0.4 05/05/2019   ALKPHOS 94 05/05/2019   AST 34 05/05/2019   ALT 42 (H) 05/05/2019   PROT 6.9 05/05/2019   ALBUMIN 3.9 05/05/2019   CALCIUM 10.1 03/17/2019   GFRAA 80 03/17/2019    Speciality Comments: Narc Agreement: 01/16/18  Procedures:  No procedures performed Allergies: Ace inhibitors, Dilantin [phenytoin sodium extended], Atorvastatin, Ramipril,  Rosuvastatin, Cymbalta [duloxetine hcl], Iodine-131, Paroxetine, Wellbutrin [bupropion], Amitriptyline hcl, Carbamazepine, Codeine, and Phenytoin sodium extended   Assessment / Plan:     Visit Diagnoses: Fibromyalgia: She has generalized hyperalgesia and positive tender points on exam.  She has been experiencing left trapezius muscle tension and muscle tenderness.  She declined a trigger point injection today.  She continues to experience nocturnal pain causing interrupted sleep at night.  She has been taking Ambien as prescribed at bedtime to help her sleep.  She takes baclofen 10 mg by mouth at bedtime for muscle spasms.  She has been having increased generalized pain since she has been more sedentary recently.  Her fatigue is also been worsening due to insomnia and not exercising on a regular basis.  She was given a handout of information about the [facility] weight loss clinic.  She is encouraged to start exercising on a daily basis.  She is planning on looking into getting a stationary bike.  She is prescribed tramadol 50 mg 1 tablet by mouth at bedtime for pain relief.  She has been experiencing increased discomfort and has stopped taking Tylenol due to elevated LFTs.  She has increased her dose of tramadol to help alleviate her discomfort.  She was advised to take tramadol only as prescribed.  She can try taking Tylenol very sparingly for pain relief.  She will follow up in 6 months.   Medication monitoring encounter -UDS and narcotic agreement will be updated today.   Plan: Pain Mgmt, Profile 5 w/Conf, U, Pain Mgmt, Tramadol w/medMATCH, U  Primary osteoarthritis of right knee: She has good ROM with discomfort.  She  has warmth of the right knee joint.  She is planning on getting a stationary bike.  She does not want to proceed with a knee replacement at this time.  Status post total knee replacement, left: Doing well.  She has good range of motion with no discomfort.  Primary osteoarthritis of both hands:  She has no tenderness or synovitis.  Complete fist formation bilaterally.  Joint protection and muscle strengthening were discussed.   Trochanteric bursitis, right hip: She has tenderness over the right trochanteric bursa.  She was encouraged to perform stretching exercises regularly.   DDD (degenerative disc disease), cervical: She has limited ROM with lateral rotation.  She has no symptoms of radiculopathy at this time.    Plantar fasciitis, right: She is wearing orthotics and was fitted for new shoes.   Other medical conditions are listed as follows:   History of gastroesophageal reflux (GERD)  History of TIA (transient ischemic attack)  History of hypertension  History of hypothyroidism  Brain aneurysm  History of depression  History of sleep apnea  History of migraine  History of hypercholesterolemia  History of diverticulosis  History of breast cancer  Hx of Clostridium difficile infection  RLS (restless legs syndrome)    Orders: Orders Placed This Encounter  Procedures  . Pain Mgmt, Profile 5 w/Conf, U  . Pain Mgmt, Tramadol w/medMATCH, U   No orders of the defined types were placed in this encounter.   Face-to-face time spent with patient was 30 minutes. Greater than 50% of time was spent in counseling and coordination of care.  Follow-Up Instructions: Return in about 6 months (around 12/08/2019) for Fibromyalgia, Osteoarthritis, DDD.   Ofilia Neas, PA-C   I examined and evaluated the patient with Hazel Sams PA.  She has been experiencing her trapezius spasm.  She declined trigger point injections.  I demonstrated some of the C-spine exercises in the office.  A handout on neck exercises was also provided.  The plan of care was discussed as noted above.  Bo Merino, MD  Note - This record has been created using Editor, commissioning.  Chart creation errors have been sought, but may not always  have been located. Such creation errors do not reflect  on  the standard of medical care.

## 2019-06-05 NOTE — Progress Notes (Signed)
Addendum: Reviewed and agree with assessment and management plan. Gussie Murton M, MD  

## 2019-06-09 ENCOUNTER — Telehealth: Payer: Self-pay | Admitting: Rheumatology

## 2019-06-09 ENCOUNTER — Encounter: Payer: Self-pay | Admitting: Rheumatology

## 2019-06-09 ENCOUNTER — Ambulatory Visit (INDEPENDENT_AMBULATORY_CARE_PROVIDER_SITE_OTHER): Payer: Medicare Other | Admitting: Rheumatology

## 2019-06-09 ENCOUNTER — Other Ambulatory Visit: Payer: Self-pay

## 2019-06-09 VITALS — BP 127/72 | HR 59 | Resp 14 | Ht 66.5 in | Wt 226.6 lb

## 2019-06-09 DIAGNOSIS — Z853 Personal history of malignant neoplasm of breast: Secondary | ICD-10-CM

## 2019-06-09 DIAGNOSIS — G2581 Restless legs syndrome: Secondary | ICD-10-CM

## 2019-06-09 DIAGNOSIS — M797 Fibromyalgia: Secondary | ICD-10-CM

## 2019-06-09 DIAGNOSIS — Z8669 Personal history of other diseases of the nervous system and sense organs: Secondary | ICD-10-CM

## 2019-06-09 DIAGNOSIS — Z96652 Presence of left artificial knee joint: Secondary | ICD-10-CM | POA: Diagnosis not present

## 2019-06-09 DIAGNOSIS — Z8659 Personal history of other mental and behavioral disorders: Secondary | ICD-10-CM

## 2019-06-09 DIAGNOSIS — M1711 Unilateral primary osteoarthritis, right knee: Secondary | ICD-10-CM | POA: Diagnosis not present

## 2019-06-09 DIAGNOSIS — Z8719 Personal history of other diseases of the digestive system: Secondary | ICD-10-CM

## 2019-06-09 DIAGNOSIS — Z5181 Encounter for therapeutic drug level monitoring: Secondary | ICD-10-CM

## 2019-06-09 DIAGNOSIS — Z8639 Personal history of other endocrine, nutritional and metabolic disease: Secondary | ICD-10-CM

## 2019-06-09 DIAGNOSIS — M7061 Trochanteric bursitis, right hip: Secondary | ICD-10-CM

## 2019-06-09 DIAGNOSIS — I671 Cerebral aneurysm, nonruptured: Secondary | ICD-10-CM

## 2019-06-09 DIAGNOSIS — M5030 Other cervical disc degeneration, unspecified cervical region: Secondary | ICD-10-CM

## 2019-06-09 DIAGNOSIS — M722 Plantar fascial fibromatosis: Secondary | ICD-10-CM

## 2019-06-09 DIAGNOSIS — M19041 Primary osteoarthritis, right hand: Secondary | ICD-10-CM

## 2019-06-09 DIAGNOSIS — Z8619 Personal history of other infectious and parasitic diseases: Secondary | ICD-10-CM

## 2019-06-09 DIAGNOSIS — Z8673 Personal history of transient ischemic attack (TIA), and cerebral infarction without residual deficits: Secondary | ICD-10-CM

## 2019-06-09 DIAGNOSIS — Z8679 Personal history of other diseases of the circulatory system: Secondary | ICD-10-CM

## 2019-06-09 DIAGNOSIS — M19042 Primary osteoarthritis, left hand: Secondary | ICD-10-CM

## 2019-06-09 NOTE — Telephone Encounter (Signed)
Patient called stating she was referred to [facility] and is on a wait list.  Patient states she was told that if Dr. Estanislado Pandy or Lovena Le called she might be able to be seen earlier.  Patient also wanted to let the office know that the paperwork that was handed to her was incorrect.  The correct name of the facility is [facility] and their phone number is [phone]

## 2019-06-09 NOTE — Patient Instructions (Signed)

## 2019-06-10 NOTE — Telephone Encounter (Signed)
Referral placed.

## 2019-06-10 NOTE — Telephone Encounter (Signed)
Please place a referral to [facility] and call to see if they have any sooner availability.

## 2019-06-11 LAB — PAIN MGMT, TRAMADOL W/MEDMATCH, U
Desmethyltramadol: >10000 ng/mL
Tramadol: >10000 ng/mL

## 2019-06-11 LAB — PAIN MGMT, PROFILE 5 W/CONF, U
Amphetamines: NEGATIVE ng/mL
Barbiturates: NEGATIVE ng/mL
Benzodiazepines: NEGATIVE ng/mL
Cocaine Metabolite: NEGATIVE ng/mL
Creatinine: 185 mg/dL
Marijuana Metabolite: NEGATIVE ng/mL
Methadone Metabolite: NEGATIVE ng/mL
Opiates: NEGATIVE ng/mL
Oxidant: NEGATIVE ug/mL
Oxycodone: NEGATIVE ng/mL
pH: 6 (ref 4.5–9.0)

## 2019-06-12 NOTE — Progress Notes (Signed)
UDS is consistent with treatment.

## 2019-06-15 ENCOUNTER — Ambulatory Visit (INDEPENDENT_AMBULATORY_CARE_PROVIDER_SITE_OTHER): Payer: Medicare Other | Admitting: Physician Assistant

## 2019-06-15 ENCOUNTER — Encounter: Payer: Self-pay | Admitting: Physician Assistant

## 2019-06-15 ENCOUNTER — Other Ambulatory Visit: Payer: Self-pay

## 2019-06-15 VITALS — BP 129/72 | HR 58 | Resp 14 | Ht 66.0 in | Wt 224.8 lb

## 2019-06-15 DIAGNOSIS — Z8673 Personal history of transient ischemic attack (TIA), and cerebral infarction without residual deficits: Secondary | ICD-10-CM

## 2019-06-15 DIAGNOSIS — M1711 Unilateral primary osteoarthritis, right knee: Secondary | ICD-10-CM

## 2019-06-15 DIAGNOSIS — M797 Fibromyalgia: Secondary | ICD-10-CM | POA: Diagnosis not present

## 2019-06-15 DIAGNOSIS — Z8679 Personal history of other diseases of the circulatory system: Secondary | ICD-10-CM

## 2019-06-15 DIAGNOSIS — Z8639 Personal history of other endocrine, nutritional and metabolic disease: Secondary | ICD-10-CM

## 2019-06-15 DIAGNOSIS — M62838 Other muscle spasm: Secondary | ICD-10-CM

## 2019-06-15 DIAGNOSIS — Z96652 Presence of left artificial knee joint: Secondary | ICD-10-CM

## 2019-06-15 DIAGNOSIS — I671 Cerebral aneurysm, nonruptured: Secondary | ICD-10-CM

## 2019-06-15 DIAGNOSIS — M19042 Primary osteoarthritis, left hand: Secondary | ICD-10-CM

## 2019-06-15 DIAGNOSIS — Z853 Personal history of malignant neoplasm of breast: Secondary | ICD-10-CM

## 2019-06-15 DIAGNOSIS — Z8719 Personal history of other diseases of the digestive system: Secondary | ICD-10-CM

## 2019-06-15 DIAGNOSIS — M7061 Trochanteric bursitis, right hip: Secondary | ICD-10-CM

## 2019-06-15 DIAGNOSIS — M722 Plantar fascial fibromatosis: Secondary | ICD-10-CM

## 2019-06-15 DIAGNOSIS — Z8659 Personal history of other mental and behavioral disorders: Secondary | ICD-10-CM

## 2019-06-15 DIAGNOSIS — M19041 Primary osteoarthritis, right hand: Secondary | ICD-10-CM

## 2019-06-15 DIAGNOSIS — M5030 Other cervical disc degeneration, unspecified cervical region: Secondary | ICD-10-CM

## 2019-06-15 DIAGNOSIS — Z8669 Personal history of other diseases of the nervous system and sense organs: Secondary | ICD-10-CM

## 2019-06-15 MED ORDER — DICLOFENAC EPOLAMINE 1.3 % TD PTCH: 1.0000 | MEDICATED_PATCH | Freq: Two times a day (BID) | TRANSDERMAL | 0 refills | Status: DC

## 2019-06-15 NOTE — Progress Notes (Signed)
Office Visit Note  Patient: [name]             Date of Birth: 1951/04/24           MRN: [id number]             PCP: Abner Greenspan, MD Referring: Tower, [name], MD Visit Date: 06/15/2019 Occupation: @GUAROCC @  Subjective:  Trapezius muscle spasms   History of Present Illness: [name] is a 68 y.o. female with history of fibromyalgia and osteoarthritis.  She presents today with trapezius muscle spasms bilaterally.  She has been having increased tension and tenderness for the past 2 to 3 days.  She requested trigger point injections today.  She has tried taking baclofen and tramadol as prescribed but is not noticed any improvement.  She has been experiencing intermittent numbness in bilateral upper extremities.  She plans on following up with her neurologist if her symptoms persist or worsen.   Activities of Daily Living:  Patient reports morning stiffness for 10-15 minutes.   Patient Reports nocturnal pain.  Difficulty dressing/grooming: Denies Difficulty climbing stairs: Reports Difficulty getting out of chair: Reports Difficulty using hands for taps, buttons, cutlery, and/or writing: Denies  Review of Systems  Constitutional: Positive for fatigue.  HENT: Positive for mouth dryness. Negative for mouth sores and nose dryness.   Eyes: Positive for dryness. Negative for pain and visual disturbance.  Respiratory: Negative for cough, hemoptysis, shortness of breath and difficulty breathing.   Cardiovascular: Negative for chest pain, palpitations, hypertension and swelling in legs/feet.  Gastrointestinal: Negative for blood in stool, constipation and diarrhea.  Endocrine: Negative for increased urination.  Genitourinary: Negative for painful urination.  Musculoskeletal: Positive for myalgias, morning stiffness, muscle tenderness and myalgias. Negative for arthralgias, joint pain, joint swelling and muscle weakness.  Skin: Negative for color change, pallor, rash, hair loss,  nodules/bumps, skin tightness, ulcers and sensitivity to sunlight.  Allergic/Immunologic: Negative for susceptible to infections.  Neurological: Negative for dizziness, numbness, headaches and weakness.  Hematological: Negative for swollen glands.  Psychiatric/Behavioral: Positive for sleep disturbance. Negative for depressed mood. The patient is nervous/anxious.     PMFS History:  Patient Active Problem List   Diagnosis Date Noted  . Pre-op exam 01/21/2018  . Neck pain 10/25/2017  . Paresthesias in left hand 10/25/2017  . Paresthesia of foot, bilateral 04/12/2017  . Primary osteoarthritis of both hands 09/27/2016  . Primary osteoarthritis of both knees 09/27/2016  . Urinary frequency 09/26/2016  . Screen for STD (sexually transmitted disease) 09/26/2016  . 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  . Nasopharyngitis, 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/2016  . Postnasal drip 05/18/2016  . S/P total knee replacement 02/13/2016  . Chronic pain of both knees 12/06/2015  . Globus pharyngeus 05/17/2015  . Productive cough 05/17/2015  . Genetic testing 12/23/2014  . Family history of breast cancer   . Family history of colon cancer   . Family history of pancreatic cancer   . Breast cancer of upper-outer quadrant of right female breast ([place]) 11/23/2014  . Allergy to ACE inhibitors 09/27/2014  . Encounter for Medicare annual wellness exam 06/27/2014  . Prediabetes 06/23/2013  . Routine general medical examination at a health care facility 06/23/2013  . RLS (restless legs syndrome) 01/15/2013  . Hx of Clostridium difficile infection 10/10/2012  . Bronchiectasis with acute exacerbation ([place]) 04/29/2012  . Dyspnea on exertion 04/02/2012  . Non-ischemic cardiomyopathy ([place]) 03/07/2012  . Colon cancer screening 01/11/2012  . Post-menopausal 01/11/2012  . Chest pain  11/07/2011  . Palpitations 04/17/2011  . Abnormal tympanic membrane 01/22/2011  . Obstructive sleep apnea 04/04/2010  . VENTRICULAR TACHYCARDIA  01/03/2009  . ICD  [employer]  Single chamber 01/03/2009  . PATENT FORAMEN OVALE 09/29/2008  . Vitamin D deficiency 08/17/2008  . Hypothyroidism 11/18/2006  . Hyperlipidemia 11/18/2006  . Depression with anxiety 11/18/2006  . Essential hypertension 11/18/2006  . MITRAL VALVE PROLAPSE 11/18/2006  . CEREBRAL ANEURYSM 11/18/2006  . Allergic rhinitis 11/18/2006  . GERD 11/18/2006  . DIVERTICULOSIS, COLON 11/18/2006  . Fatty liver 11/18/2006  . Fibromyalgia 11/18/2006    Past Medical History:  Diagnosis Date  . AICD (automatic cardioverter/defibrillator) present   . Anxiety   . Arthritis   . 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 Cerebral aneurysm, nonruptured    had a clip put in  . Clostridium difficile infection   . Depressive disorder, not elsewhere classified   . Diverticulosis of colon (without mention of hemorrhage)   . Esophageal reflux   . Fibromyalgia   . Gastritis   . GERD (gastroesophageal reflux disease)   . GI bleed 2004  . Hiatal hernia   . Hyperlipidemia   . Hypertension   . Hypothyroidism   . Internal hemorrhoids   . Obstructive sleep apnea (adult) (pediatric)   . Ostium secundum type atrial septal defect   . Other chronic nonalcoholic liver disease   . Other pulmonary embolism and infarction   . Paroxysmal ventricular tachycardia ([place])   . Personal history of radiation therapy   . Pneumonia    history  . Presence of permanent cardiac pacemaker   . PUD (peptic ulcer disease)   . Radiation 02/03/15-03/10/15   Right Breast  . Sarcoid    per pt , not sure  . Schatzki's ring   . Stroke [facility]) 2013   tia/ pt feels it was around 2008 0r 2009  . Takotsubo syndrome   . Tubular adenoma of colon   . Unspecified transient cerebral ischemia   . Unspecified vitamin D deficiency     Family History  Problem Relation Age of Onset  . Diabetes Mother   . Alzheimer's disease Mother   . Other Brother        Thyroid problem  10/2016  . Allergies Sister   . Heart disease Sister   . Heart disease Brother   . Prostate cancer Brother 67       same brother as throat cancer  . Throat cancer Brother        dx in his 26s; also a smoker  . Heart disease Father   . Cancer Maternal Grandmother 48       colon cancer or abdominal cancer  . Lung cancer Maternal Grandfather 36  . Breast cancer Maternal Aunt 21  . Colon cancer Maternal Aunt 11       same sister as breast at 78  . Lung cancer Maternal Uncle   . Breast cancer Maternal Aunt        dx in her 40s  . Pancreatic cancer Cousin 79       maternal first cousin  . Breast cancer Cousin        paternal first cousin twice removed died in her 64s  . Cardiomyopathy Other        Family history  . Heart disease Son        Cardiac Arrest 07/2016  . Stroke Neg Hx    Past Surgical  History:  Procedure Laterality Date  . ABI  2006   normal  . BREAST EXCISIONAL BIOPSY    . BREAST LUMPECTOMY WITH RADIOACTIVE SEED LOCALIZATION Right 01/03/2015   Procedure: BREAST LUMPECTOMY WITH RADIOACTIVE SEED LOCALIZATION;  Surgeon: Autumn Messing III, MD;  Location: [place];  Service: General;  Laterality: Right;  . CARDIAC DEFIBRILLATOR PLACEMENT  2006; 2012   BSX single chamber ICD  . carotid dopplers  2006   neg  . CEREBRAL ANEURYSM REPAIR  02/1999  . COLONOSCOPY    . hospitalization  2004   GI bleed, PUD, diverticulosis (EGD,colonscopy)  . hospitalization     PE, NSVT, s/p defib  . KNEE ARTHROSCOPY     bilateral  . LEFT HEART CATHETERIZATION WITH CORONARY ANGIOGRAM N/A 02/22/2012   Procedure: LEFT HEART CATHETERIZATION WITH CORONARY ANGIOGRAM;  Surgeon: Burnell Blanks, MD;  Location: [facility] CATH LAB;  Service: Cardiovascular;  Laterality: N/A;  . PARTIAL HYSTERECTOMY     Fibroids  . TONGUE BIOPSY  12/12/2017   due to sore tongue and white patches/abnormal cells  . TOTAL KNEE ARTHROPLASTY Left 02/13/2016   Procedure: TOTAL KNEE ARTHROPLASTY;  Surgeon: Vickey Huger, MD;   Location: [place];  Service: Orthopedics;  Laterality: Left;  . UPPER GASTROINTESTINAL ENDOSCOPY     Social History   Social History Narrative   Lives alone   Caffeine [id number]   Retired from [employer]   2 children   Divorced   Right handed    Immunization History  Administered Date(s) Administered  . Fluad Quad(high Dose 65+) 04/17/2019  . H1N1 07/24/2008  . Influenza Split 07/17/2011, 09/27/2011, 04/29/2012  . Influenza Whole 07/20/2004, 07/04/2007, 05/07/2008, 05/19/2009, 05/17/2010  . Influenza, High Dose Seasonal PF 05/30/2017, 05/21/2018  . Influenza,inj,Quad PF,6+ Mos 05/20/2013, 05/06/2014, 05/17/2015, 05/15/2016  . Influenza-Unspecified 05/07/2018  . Pneumococcal Conjugate-13 11/22/2015  . Pneumococcal Polysaccharide-23 05/20/2002, 05/07/2008, 06/25/2014  . Td 09/07/2009  . Zoster 09/21/2011     Objective: Vital Signs: BP 129/72 (BP Location: Left Wrist, Patient Position: Sitting, Cuff Size: Normal)   Pulse (!) 58   Resp 14   Ht 5\' 6"  (1.676 m)   Wt 224 lb 12.8 oz (102 kg)   LMP  (LMP Unknown)   BMI 36.28 kg/m    Physical Exam Vitals signs and nursing note reviewed.  Constitutional:      Appearance: She is well-developed.  HENT:     Head: Normocephalic and atraumatic.  Eyes:     Conjunctiva/sclera: Conjunctivae normal.  Neck:     Musculoskeletal: Normal range of motion.  Cardiovascular:     Rate and Rhythm: Normal rate and regular rhythm.     Heart sounds: Normal heart sounds.  Pulmonary:     Effort: Pulmonary effort is normal.     Breath sounds: Normal breath sounds.  Abdominal:     General: Bowel sounds are normal.     Palpations: Abdomen is soft.  Lymphadenopathy:     Cervical: No cervical adenopathy.  Skin:    General: Skin is warm and dry.     Capillary Refill: Capillary refill takes less than 2 seconds.  Neurological:     Mental Status: She is alert and oriented to person, place, and time.  Psychiatric:        Behavior: Behavior normal.       Musculoskeletal Exam:Generalized hyperalgesia and positive tender points. C-spine limited ROM with discomfort.  Trapezius muscle spasms bilaterally.  Shoulder joints, elbow joints, wrist joints, MCPs, PIPs, and DIPs good ROM  with no synovitis.  Complete fist formation bilaterally.   Hip joints, knee joints, ankle joints, MTPs, PIPs, and DIPs good ROM.  No warmth or effusion of knee joints.  No tenderness or swelling of ankle joints.    CDAI Exam: CDAI Score: - Patient Global: -; Provider Global: - Swollen: -; Tender: - Joint Exam   No joint exam has been documented for this visit   There is currently no information documented on the homunculus. Go to the Rheumatology activity and complete the homunculus joint exam.  Investigation: No additional findings.  Imaging: No results found.  Recent Labs: Lab Results  Component Value Date   WBC 4.5 03/17/2019   HGB 13.0 03/17/2019   PLT 210 03/17/2019   NA 140 03/17/2019   K 4.7 03/17/2019   CL 102 03/17/2019   CO2 24 03/17/2019   GLUCOSE 85 03/17/2019   BUN 10 03/17/2019   CREATININE 0.85 03/17/2019   BILITOT 0.4 05/05/2019   ALKPHOS 94 05/05/2019   AST 34 05/05/2019   ALT 42 (H) 05/05/2019   PROT 6.9 05/05/2019   ALBUMIN 3.9 05/05/2019   CALCIUM 10.1 03/17/2019   GFRAA 80 03/17/2019    Speciality Comments: Narc Agreement: 01/16/18  Procedures:  Trigger Point Inj  Date/Time: 06/15/2019 11:09 AM Performed by: Ofilia Neas, PA-C Authorized by: Ofilia Neas, PA-C   Consent Given by:  Patient Site marked: the procedure site was marked   Timeout: prior to procedure the correct patient, procedure, and site was verified   Indications:  Pain Total # of Trigger Points:  2 Location: neck   Needle Size:  27 G Approach:  Dorsal Medications #1:  0.5 mL lidocaine 1 %; 10 mg triamcinolone acetonide 40 MG/ML Medications #2:  0.5 mL lidocaine 1 %; 10 mg triamcinolone acetonide 40 MG/ML Patient tolerance:  Patient  tolerated the procedure well with no immediate complications   Allergies: Ace inhibitors, Dilantin [phenytoin sodium extended], Atorvastatin, Ramipril, Rosuvastatin, Cymbalta [duloxetine hcl], Iodine-131, Paroxetine, Wellbutrin [bupropion], Amitriptyline hcl, Carbamazepine, Codeine, and Phenytoin sodium extended   Assessment / Plan:     Visit Diagnoses: Fibromyalgia: She has generalized hyperalgesia and positive tender points on exam.  She presents today with trapezius muscle spasms bilaterally.  She has been explained experiencing severe muscle tension and muscle tenderness in the left trapezius for the past several days.  She requested bilateral trigger point injections.  She tolerated the procedure well.  Aftercare was discussed.  She has been taking baclofen and tramadol as prescribed for pain relief.  She is also taken Tylenol sparingly for pain relief.  She continues have generalized muscle aches and muscle tenderness due to fibromyalgia.  She has tenderness over the right trochanter bursa.  She was encouraged to stay active and exercise on a regular basis.  She will follow-up in the office in 6 months.  Trapezius muscle spasm: She has trapezius muscle tension and muscle spasms bilaterally.  She has been having severe discomfort for the past several days.  She has also been experiencing intermittent numbness in bilateral upper extremities.  She requested bilateral trigger point injections today.  She tolerated the procedure well.  Aftercare was discussed.  She was advised to call her neurologist for further evaluation if her symptoms persist or worsen.  Primary osteoarthritis of right knee: She has good range of motion with no warmth or effusion.  Status post total knee replacement, left: Doing well.  Good range of motion with no discomfort  Primary osteoarthritis of both  hands: She does not have any discomfort in her hands at this time.  She has no difficulty with ADLs.  She has no synovitis or  tenderness on exam.  Joint protection and muscle strengthening were discussed.  Trochanteric bursitis, right hip: She has tenderness over the right trochanteric bursa.    DDD (degenerative disc disease), cervical: She has limited range of motion of the C-spine with some discomfort.  She has trapezius muscle tension and muscle tenderness bilaterally.  She requested trigger point junctions today.  She was advised to schedule appointment with her neurologist for further evaluation and treatment if her symptoms persist or worsen.  Plantar fasciitis, right: She wears orthotics.   Other medical conditions are listed as follows:   History of gastroesophageal reflux (GERD)  History of hypertension  History of hypothyroidism  Brain aneurysm  History of depression  History of sleep apnea  History of TIA (transient ischemic attack)  History of migraine  History of hypercholesterolemia  History of diverticulosis  History of breast cancer  Orders: Orders Placed This Encounter  Procedures  . Trigger Point Inj   Meds ordered this encounter  Medications  . diclofenac (FLECTOR) 1.3 % PTCH    Sig: Place 1 patch onto the skin 2 (two) times daily.    Dispense:  30 patch    Refill:  0     Follow-Up Instructions: Return for Fibromyalgia, Osteoarthritis.   Ofilia Neas, PA-C    I examined and evaluated the patient with Hazel Sams PA. The plan of care was discussed as noted above.  Bo Merino, MD Note - This record has been created using Editor, commissioning.  Chart creation errors have been sought, but may not always  have been located. Such creation errors do not reflect on  the standard of medical care.

## 2019-06-15 NOTE — Progress Notes (Signed)
Medication Samples have been provided to the patient.  Drug name: Flector Patch       Strength: 1.3%      Qty: 2 patche   [id number]   Exp.Date: 06/2019  Dosing instructions: Apply 1 patch every 12 hours  The patient has been instructed regarding the correct time, dose, and frequency of taking this medication, including desired effects and most common side effects.   Idolina Mantell C Gerald Honea 11:17 AM 06/15/2019

## 2019-06-16 NOTE — Progress Notes (Signed)
PATIENT: [name] DOB: 1951-01-06  REASON FOR VISIT: follow up HISTORY FROM: patient  HISTORY OF PRESENT ILLNESS: Today 06/17/19  [name] is a 68 year old female with history of fibromyalgia, chronic neck pain, and paresthesias in her left hand.  CT scan of the cervical spine suggested spondylosis that could lead to left C5 nerve root impingement.  EMG and nerve conduction studies done previously did not show evidence of radiculopathy.  She presents today reporting for 1 week, she has had persistent neck pain, paresthesia into the left arm, numbness/tingling in both hands, but worse on the left.  She has had this symptom presentation before, but had subsided.  She is not able to identify anything that may have triggered her symptoms.  She did see her rheumatologist on Monday of this week, and was given steroid and lidocaine injection in her bilateral trapezius muscles.  She has been taking baclofen, tramadol, and using diclofenac patches without benefit.  She denies any weakness to her arms or legs.  She has not had any falls.  She denies any changes to her bowels or bladder.  She presents today for follow-up unaccompanied.  HISTORY 11/26/2018 Dr. Jannifer Franklin: [name] is a 68 year old right-handed black female with a history of fibromyalgia, currently followed by Dr. Estanislado Pandy.  The patient has been seen here for some problems with left arm paresthesias and hand numbness, this has since improved.  She was last seen with right shoulder pain, it was felt to be intrinsic shoulder tendinopathy or arthritis, she has received an injection in the right shoulder with some benefit.  She still does not have complete full range of movement the right shoulder.  She does occasionally have some neck pain as well.  She is trying to remain somewhat active, she does yard work intermittently.  She is sleeping well at night, she has a good energy level during the day.  She has some Ultram to take if needed for  discomfort.  She was given a prescription for gabapentin but could not tolerate even a low dose as it made her feel poorly.  She was recently placed on Cymbalta in part because of some situational depression, but she could not tolerate even a 20 mg dose.  REVIEW OF SYSTEMS: Out of a complete 14 system review of symptoms, the patient complains only of the following symptoms, and all other reviewed systems are negative.  Neck pain  ALLERGIES: Allergies  Allergen Reactions   Ace Inhibitors Swelling    Angioedema; makes tongue "break out"    Dilantin [Phenytoin Sodium Extended] Rash    Severe rash   Atorvastatin Other (See Comments)    SEVERE MYALGIA   Ramipril Other (See Comments)    TONGUE ULCERS    Rosuvastatin Other (See Comments)    SEVERE MYALGIA   Cymbalta [Duloxetine Hcl]     Sleepiness/ sick   Iodine-131 Other (See Comments)    Swelling at IV site only, no swelling or SOB around mouth.!   Paroxetine Nausea Only    Rapid heartbeat   Wellbutrin [Bupropion]     Palpitations    Amitriptyline Hcl Other (See Comments)     makes her too sleepy!   Carbamazepine Rash   Codeine Itching   Phenytoin Sodium Extended Rash    HOME MEDICATIONS: Outpatient Medications Prior to Visit  Medication Sig Dispense Refill   albuterol (PROVENTIL) (2.5 MG/3ML) 0.083% nebulizer solution Take 3 mLs (2.5 mg total) by nebulization every 6 (six) hours as needed  for wheezing or shortness of breath. 75 mL 12   albuterol (VENTOLIN HFA) 108 (90 Base) MCG/ACT inhaler Inhale 2 puffs into the lungs every 6 (six) hours as needed for wheezing or shortness of breath. 18 g 3   Ascorbic Acid (VITAMIN C) 1000 MG tablet Take 1,000 mg by mouth daily.     aspirin 81 MG tablet Take 81 mg by mouth daily.     baclofen (LIORESAL) 10 MG tablet TAKE 1 TABLET THREE TIMES DAILY (Patient taking differently: 10 mg. ) 270 tablet 0   calcium carbonate (OS-CAL - DOSED IN MG OF ELEMENTAL CALCIUM) 1250 (500 Ca)  MG tablet Take 1 tablet by mouth.     cholecalciferol (VITAMIN D) 1000 UNITS tablet Take 5,000 Units by mouth daily.      diclofenac (FLECTOR) 1.3 % PTCH Place 1 patch onto the skin 2 (two) times daily. 30 patch 0   dicyclomine (BENTYL) 10 MG capsule Take 1 capsule (10 mg total) by mouth 2 (two) times daily. 30 capsule 0   fexofenadine (ALLEGRA) 180 MG tablet Take 180 mg by mouth daily as needed for allergies or rhinitis.     fluticasone (FLONASE) 50 MCG/ACT nasal spray Place 2 sprays into both nostrils daily as needed for allergies or rhinitis. 48 g 3   furosemide (LASIX) 40 MG tablet Take 1 tablet (40 mg total) by mouth every other day. (Patient taking differently: Take 20 mg by mouth daily. ) 45 tablet 3   hydroxypropyl methylcellulose / hypromellose (ISOPTO TEARS / GONIOVISC) 2.5 % ophthalmic solution Place 1 drop into both eyes daily.     latanoprost (XALATAN) 0.005 % ophthalmic solution Place 1 drop into both eyes at bedtime.     levothyroxine (SYNTHROID) 50 MCG tablet Take 1 tablet (50 mcg total) by mouth daily. 90 tablet 3   Magnesium Oxide 400 (240 Mg) MG TABS Take 1 tablet (400 mg total) by mouth daily. 90 tablet 3   mometasone (ELOCON) 0.1 % lotion Apply 1 application topically daily as needed (rash).      montelukast (SINGULAIR) 10 MG tablet TAKE 1 TABLET AT BEDTIME 90 tablet 3   Multiple Vitamins-Minerals (MULTIVITAMIN PO) Take 1 tablet by mouth daily.     pantoprazole (PROTONIX) 40 MG tablet Take 1 tablet (40 mg total) by mouth 2 (two) times daily before a meal. (Patient taking differently: Take 40 mg by mouth as needed. ) 180 tablet 1   polyethylene glycol powder (GLYCOLAX/MIRALAX) powder Take 17 grams by mouth daily 1080 g 0   potassium chloride (K-DUR) 10 MEQ tablet TAKE 1/2 TABLET (5MEQ TOTAL) EVERY DAY 45 tablet 3   Respiratory Therapy Supplies (FLUTTER) DEVI 1 Device by Does not apply route as needed. 1 each 0   simvastatin (ZOCOR) 40 MG tablet TAKE 1/2 TABLET  (20MG ) EVERY DAY 45 tablet 2   traMADol (ULTRAM) 50 MG tablet Take 1 tablet (50 mg total) by mouth at bedtime. 30 tablet 1   zinc gluconate 50 MG tablet Take 50 mg by mouth daily.     zolpidem (AMBIEN) 10 MG tablet TAKE 1 TABLET BY MOUTH AT BEDTIME AS NEEDED FOR SLEEP 90 tablet 1   carvedilol (COREG) 12.5 MG tablet Take 1 tablet (12.5 mg total) by mouth 2 (two) times daily. 90 tablet 3   No facility-administered medications prior to visit.     PAST MEDICAL HISTORY: Past Medical History:  Diagnosis Date   AICD (automatic cardioverter/defibrillator) present    Anxiety    Arthritis  Breast cancer ([place]) 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   Cerebral aneurysm, nonruptured    had a clip put in   Clostridium difficile infection    Depressive disorder, not elsewhere classified    Diverticulosis of colon (without mention of hemorrhage)    Esophageal reflux    Fibromyalgia    Gastritis    GERD (gastroesophageal reflux disease)    GI bleed 2004   Hiatal hernia    Hyperlipidemia    Hypertension    Hypothyroidism    Internal hemorrhoids    Obstructive sleep apnea (adult) (pediatric)    Ostium secundum type atrial septal defect    Other chronic nonalcoholic liver disease    Other pulmonary embolism and infarction    Paroxysmal ventricular tachycardia (HCC)    Personal history of radiation therapy    Pneumonia    history   Presence of permanent cardiac pacemaker    PUD (peptic ulcer disease)    Radiation 02/03/15-03/10/15   Right Breast   Sarcoid    per pt , not sure   Schatzki's ring    Stroke [facility]) 2013   tia/ pt feels it was around 2008 0r 2009   Takotsubo syndrome    Tubular adenoma of colon    Unspecified transient cerebral ischemia    Unspecified vitamin D deficiency     PAST SURGICAL HISTORY: Past Surgical History:  Procedure Laterality Date   ABI  2006   normal   BREAST EXCISIONAL BIOPSY     BREAST LUMPECTOMY  WITH RADIOACTIVE SEED LOCALIZATION Right 01/03/2015   Procedure: BREAST LUMPECTOMY WITH RADIOACTIVE SEED LOCALIZATION;  Surgeon: Autumn Messing III, MD;  Location: [facility];  Service: General;  Laterality: Right;   CARDIAC DEFIBRILLATOR PLACEMENT  2006; 2012   BSX single chamber ICD   carotid dopplers  2006   neg   CEREBRAL ANEURYSM REPAIR  02/1999   COLONOSCOPY     hospitalization  2004   GI bleed, PUD, diverticulosis (EGD,colonscopy)   hospitalization     PE, NSVT, s/p defib   KNEE ARTHROSCOPY     bilateral   LEFT HEART CATHETERIZATION WITH CORONARY ANGIOGRAM N/A 02/22/2012   Procedure: LEFT HEART CATHETERIZATION WITH CORONARY ANGIOGRAM;  Surgeon: Burnell Blanks, MD;  Location: [facility] CATH LAB;  Service: Cardiovascular;  Laterality: N/A;   PARTIAL HYSTERECTOMY     Fibroids   TONGUE BIOPSY  12/12/2017   due to sore tongue and white patches/abnormal cells   TOTAL KNEE ARTHROPLASTY Left 02/13/2016   Procedure: TOTAL KNEE ARTHROPLASTY;  Surgeon: Vickey Huger, MD;  Location: [place];  Service: Orthopedics;  Laterality: Left;   UPPER GASTROINTESTINAL ENDOSCOPY      FAMILY HISTORY: Family History  Problem Relation Age of Onset   Diabetes Mother    Alzheimer's disease Mother    Other Brother        Thyroid problem 10/2016   Allergies Sister    Heart disease Sister    Heart disease Brother    Prostate cancer Brother 87       same brother as throat cancer   Throat cancer Brother        dx in his 67s; also a smoker   Heart disease Father    Cancer Maternal Grandmother 25       colon cancer or abdominal cancer   Lung cancer Maternal Grandfather 76   Breast cancer Maternal Aunt 55   Colon cancer Maternal Aunt 53  same sister as breast at 56   Lung cancer Maternal Uncle    Breast cancer Maternal Aunt        dx in her 11s   Pancreatic cancer Cousin 60       maternal first cousin   Breast cancer Cousin        paternal first cousin twice removed died in her  81s   Cardiomyopathy Other        Family history   Heart disease Son        Cardiac Arrest 07/2016   Stroke Neg Hx     SOCIAL HISTORY: Social History   Socioeconomic History   Marital status: Divorced    Spouse name: Not on file   Number of children: 2   Years of education: 14   Highest education level: Not on file  Occupational History   Occupation: DISABLED  Social Designer, fashion/clothing strain: Not on file   Food insecurity    Worry: Not on file    Inability: Not on file   Transportation needs    Medical: Not on file    Non-medical: Not on file  Tobacco Use   Smoking status: Former Smoker    Packs/day: 1.00    Years: 15.00    Pack years: 15.00    Types: Cigarettes    Quit date: 08/20/1989    Years since quitting: 29.8   Smokeless tobacco: Never Used   Tobacco comment: Started at 15; less than 1 PPD  Substance and Sexual Activity   Alcohol use: Yes    Alcohol/week: 0.0 standard drinks    Comment: rare   Drug use: No   Sexual activity: Not Currently  Lifestyle   Physical activity    Days per week: Not on file    Minutes per session: Not on file   Stress: Not on file  Relationships   Social connections    Talks on phone: Not on file    Gets together: Not on file    Attends religious service: Not on file    Active member of club or organization: Not on file    Attends meetings of clubs or organizations: Not on file    Relationship status: Not on file   Intimate partner violence    Fear of current or ex partner: Not on file    Emotionally abused: Not on file    Physically abused: Not on file    Forced sexual activity: Not on file  Other Topics Concern   Not on file  Social History Narrative   Lives alone   Caffeine [id number]   Retired from [employer]   2 children   Divorced   Right handed     PHYSICAL EXAM  Vitals:   06/17/19 0835  BP: (!) 146/76  Pulse: 61  Temp: (!) 97.4 F (36.3 C)  Weight: 223 lb (101.2 kg)    Height: 5\' 6"  (1.676 m)   Body mass index is 35.99 kg/m.  Generalized: Well developed, in no acute distress   Neurological examination  Mentation: Alert oriented to time, place, history taking. Follows all commands speech and language fluent Cranial nerve II-XII: Pupils were equal round reactive to light. Extraocular movements were full, visual field were full on confrontational test. Facial sensation and strength were normal. . Head turning and shoulder shrug  were normal and symmetric, right cervical rotation is pain producing Motor: The motor testing reveals 5 over 5 strength of all 4 extremities. Good  symmetric motor tone is noted throughout.  Abduction, abduction of the arms is intact.  Equal squeeze grips of the right and left hand.  Sensory: Sensory testing is intact to soft touch on all 4 extremities. No evidence of extinction is noted.  Coordination: Cerebellar testing reveals good finger-nose-finger and heel-to-shin bilaterally.  Gait and station: Gait is normal. Tandem gait is normal.  Reflexes: Deep tendon reflexes are symmetric and normal bilaterally.   DIAGNOSTIC DATA (LABS, IMAGING, TESTING) - I reviewed patient records, labs, notes, testing and imaging myself where available.  Lab Results  Component Value Date   WBC 4.5 03/17/2019   HGB 13.0 03/17/2019   HCT 39.4 03/17/2019   MCV 87 03/17/2019   PLT 210 03/17/2019      Component Value Date/Time   NA 140 03/17/2019 1555   NA 141 12/01/2014 0847   K 4.7 03/17/2019 1555   K 3.8 12/01/2014 0847   CL 102 03/17/2019 1555   CO2 24 03/17/2019 1555   CO2 26 12/01/2014 0847   GLUCOSE 85 03/17/2019 1555   GLUCOSE 101 (H) 12/04/2018 1240   GLUCOSE 175 (H) 12/01/2014 0847   BUN 10 03/17/2019 1555   BUN 10.6 12/01/2014 0847   CREATININE 0.85 03/17/2019 1555   CREATININE 0.82 02/27/2017 1518   CREATININE 0.8 12/01/2014 0847   CALCIUM 10.1 03/17/2019 1555   CALCIUM 9.8 12/01/2014 0847   PROT 6.9 05/05/2019 1113   PROT  7.2 03/17/2019 1555   PROT 7.5 12/01/2014 0847   ALBUMIN 3.9 05/05/2019 1113   ALBUMIN 4.3 03/17/2019 1555   ALBUMIN 3.9 12/01/2014 0847   AST 34 05/05/2019 1113   AST 29 12/01/2014 0847   ALT 42 (H) 05/05/2019 1113   ALT 33 12/01/2014 0847   ALKPHOS 94 05/05/2019 1113   ALKPHOS 127 12/01/2014 0847   BILITOT 0.4 05/05/2019 1113   BILITOT 0.3 03/17/2019 1555   BILITOT 0.42 12/01/2014 0847   GFRNONAA 70 03/17/2019 1555   GFRNONAA 74 02/27/2017 1518   GFRAA 80 03/17/2019 1555   GFRAA 85 02/27/2017 1518   Lab Results  Component Value Date   CHOL 170 12/04/2018   HDL 44.00 12/04/2018   LDLCALC 91 12/04/2018   LDLDIRECT 141.8 07/04/2007   TRIG 175.0 (H) 12/04/2018   CHOLHDL 4 12/04/2018   Lab Results  Component Value Date   HGBA1C 5.4 12/04/2018   Lab Results  Component Value Date   VITAMINB12 403 04/12/2017   Lab Results  Component Value Date   TSH 4.05 12/04/2018    ASSESSMENT AND PLAN 68 y.o. year old female  has a past medical history of AICD (automatic cardioverter/defibrillator) present, Anxiety, Arthritis, Breast cancer ([place]) (2016), Cerebral aneurysm, nonruptured, Clostridium difficile infection, Depressive disorder, not elsewhere classified, Diverticulosis of colon (without mention of hemorrhage), Esophageal reflux, Fibromyalgia, Gastritis, GERD (gastroesophageal reflux disease), GI bleed (2004), Hiatal hernia, Hyperlipidemia, Hypertension, Hypothyroidism, Internal hemorrhoids, Obstructive sleep apnea (adult) (pediatric), Ostium secundum type atrial septal defect, Other chronic nonalcoholic liver disease, Other pulmonary embolism and infarction, Paroxysmal ventricular tachycardia ([place]), Personal history of radiation therapy, Pneumonia, Presence of permanent cardiac pacemaker, PUD (peptic ulcer disease), Radiation (02/03/15-03/10/15), Sarcoid, Schatzki's ring, Stroke ([place]) (2013), Takotsubo syndrome, Tubular adenoma of colon, Unspecified transient cerebral ischemia, and  Unspecified vitamin D deficiency. here with:  1.  Fibromyalgia 2.  Neck pain 3.  Paresthesia, down the left arm  Previous CT scan of the cervical spine suggested spondylosis that could lead to a left C5 nerve root impingement.  Nerve conduction  studies done previously did not show evidence of a radiculopathy.  Due to the recurrence of neck pain, and paresthesia down the left arm, and numbness to both hands, left greater than the right, I will get her set up for epidural steroid injection of the cervical spine.  She did receive lidocaine/steroid injection in her bilateral trapezius muscles 2 days ago at her rheumatologist.  She will start gabapentin 300 mg twice a day for pain until the injection can be completed.  She will follow-up in 3 months or sooner if needed.  I did advise her symptoms worsen or she develops any new symptoms she should let us know.  CT cervical spine 01/23/2018 IMPRESSION: 1. Chronic C5-C6 solid arthrodesis/ankylosis. 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 2. Progressed upper cervical disc, endplate, and facet degeneration since a 2009 CTA. -moderate spinal stenosis at C4-C5. Mild spinal stenosis at C3-C4, C6-C7. - up to moderate bilateral C4 and severe right C5 foraminal stenosis. 3.  No acute osseous abnormality identified.  I spent 25 minutes with the patient. 50% of this time was spent discussing her plan of care.  Butler Denmark, AGNP-C, DNP 06/17/2019, 8:39 AM [facility] [address], [place], [place] [address] [phone]

## 2019-06-16 NOTE — Progress Notes (Signed)
Remote ICD transmission.   

## 2019-06-17 ENCOUNTER — Encounter: Payer: Self-pay | Admitting: Neurology

## 2019-06-17 ENCOUNTER — Other Ambulatory Visit: Payer: Self-pay

## 2019-06-17 ENCOUNTER — Other Ambulatory Visit: Payer: Self-pay | Admitting: Neurology

## 2019-06-17 ENCOUNTER — Telehealth: Payer: Self-pay | Admitting: Neurology

## 2019-06-17 ENCOUNTER — Ambulatory Visit (INDEPENDENT_AMBULATORY_CARE_PROVIDER_SITE_OTHER): Payer: Medicare Other | Admitting: Neurology

## 2019-06-17 VITALS — BP 146/76 | HR 61 | Temp 97.4°F | Ht 66.0 in | Wt 223.0 lb

## 2019-06-17 DIAGNOSIS — R202 Paresthesia of skin: Secondary | ICD-10-CM | POA: Diagnosis not present

## 2019-06-17 DIAGNOSIS — M542 Cervicalgia: Secondary | ICD-10-CM | POA: Diagnosis not present

## 2019-06-17 DIAGNOSIS — M797 Fibromyalgia: Secondary | ICD-10-CM | POA: Diagnosis not present

## 2019-06-17 MED ORDER — GABAPENTIN 300 MG PO CAPS: 300.0000 mg | ORAL_CAPSULE | Freq: Two times a day (BID) | ORAL | 3 refills | Status: DC

## 2019-06-17 NOTE — Telephone Encounter (Signed)
[name] @ [facility] has called for [name] to inform her that they do no do Nerve Root Blocks for cervical spines.  [name] said she can do a regular ESI.  Please call her back to discuss phone:[phone] fax [phone]

## 2019-06-17 NOTE — Patient Instructions (Signed)
I will get you setup for an epidural steroid injection at [facility]. Meanwhile, start Gabapentin 300 mg twice a day.

## 2019-06-17 NOTE — Progress Notes (Signed)
I have read the note, and I agree with the clinical assessment and plan.  Akari Crysler K Prerna Harold   

## 2019-06-18 ENCOUNTER — Other Ambulatory Visit: Payer: Self-pay | Admitting: Neurology

## 2019-06-18 ENCOUNTER — Telehealth: Payer: Self-pay | Admitting: Rheumatology

## 2019-06-18 DIAGNOSIS — M542 Cervicalgia: Secondary | ICD-10-CM

## 2019-06-18 MED ORDER — TRAMADOL HCL 50 MG PO TABS: 50.0000 mg | ORAL_TABLET | Freq: Three times a day (TID) | ORAL | 0 refills | Status: DC | PRN

## 2019-06-18 NOTE — Telephone Encounter (Signed)
Spoke with patient and she states with the possible pinched nerve in her neck she has been taking more than her prescribed dose of tramadol. She is prescribed 1 tablet at bedtime. Patient states she is taking it three times daily now because of the pain in her neck. She was given Gabapentin by her neurologist. Please advise.

## 2019-06-18 NOTE — Telephone Encounter (Signed)
I called pt and she asking if her gabapentin 300mg  po could be increased (now on BID), since she has breakthru pain and her rheumatologist is weaning her off of tramadol and she only has 7 days left.  I told her that SS/NP can address for her on Monday.  She verbalized understanding.

## 2019-06-18 NOTE — Telephone Encounter (Signed)
I tried again to call [facility] but their phones are still down.

## 2019-06-18 NOTE — Telephone Encounter (Signed)
Please call in tramadol 50 mg 3 times daily as needed for 7 days for acute neck pain.

## 2019-06-18 NOTE — Telephone Encounter (Signed)
Pt is requesting a call back to discuss her nerve pain in her neck

## 2019-06-18 NOTE — Telephone Encounter (Signed)
Patient called requesting prescription refill of Tramadol to be sent to [facility] at [address]

## 2019-06-18 NOTE — Telephone Encounter (Signed)
Patient advised per Dr. Estanislado Pandy we would send in prescription for tramadol 50 mg 3 times daily as needed for 7 days for acute neck pain.

## 2019-06-18 NOTE — Progress Notes (Signed)
I tried to call GI to inquire about the correct order, but their phone lines are down due to weather. I would like a cervical epidural steroid injection. I will place the order as referral for interventional radiology at [place].

## 2019-06-19 NOTE — Telephone Encounter (Signed)
Pt has called to inform that she has been notified by [facility] that they do not do the Nerve Root Epidural at the neck and that they need new orders.  Please call

## 2019-06-22 ENCOUNTER — Other Ambulatory Visit: Payer: Self-pay | Admitting: Neurology

## 2019-06-22 DIAGNOSIS — M542 Cervicalgia: Secondary | ICD-10-CM

## 2019-06-22 NOTE — Telephone Encounter (Signed)
I called the patient. She is scheduled for her cervical epidural steroid injection on Friday. She is saying her Tramadol will not be renewed from her rheumatologist. She says the gabapentin 300 mg twice a day is working well for her pain. She is feeling much better, and the pain has calmed down, but she is still planning to get her injection because the discomfort is still there. We will not increase gabapentin at this time, but we could in the future if needed.

## 2019-06-22 NOTE — Telephone Encounter (Signed)
I called and spoke to [facility] at [facility].  [phone].  I relayed that new order placed in Epic as a referral.  She stated that she does not get those orders unless called to let them know.  She will take care of this.  I attempted to cancel the Nerve root order and from the ofv note 06-17-19 it shows cancelled.

## 2019-06-24 ENCOUNTER — Encounter: Payer: Medicare Other | Admitting: Internal Medicine

## 2019-06-26 ENCOUNTER — Other Ambulatory Visit: Payer: Medicare Other

## 2019-06-26 ENCOUNTER — Telehealth: Payer: Self-pay | Admitting: Rheumatology

## 2019-06-26 DIAGNOSIS — G8929 Other chronic pain: Secondary | ICD-10-CM

## 2019-06-26 NOTE — Telephone Encounter (Signed)
Patient called stating her left shoulder is still very painful.  Patient is asking if Dr. Estanislado Pandy would recommend physical therapy.

## 2019-06-30 ENCOUNTER — Telehealth: Payer: Self-pay | Admitting: Internal Medicine

## 2019-06-30 NOTE — Telephone Encounter (Signed)
Patient advised referral has been placed.

## 2019-06-30 NOTE — Telephone Encounter (Signed)
Ok to refer patient to physical therapy

## 2019-06-30 NOTE — Telephone Encounter (Signed)
Hi Dr. Hilarie Fredrickson, this pt just cancelled her egd with you that was scheduled this Thursday 11/12. She stated to have some issues with her neck and she will be having an injection tomorrow so she does not feel well enough to proceed with procedure. She will call back to reschedule. Thank you.

## 2019-07-01 ENCOUNTER — Ambulatory Visit
Admission: RE | Admit: 2019-07-01 | Discharge: 2019-07-01 | Disposition: A | Discharge status: Home / Self Care | Payer: Medicare Other | Source: Ambulatory Visit | Attending: Neurology | Admitting: Neurology

## 2019-07-01 ENCOUNTER — Other Ambulatory Visit: Payer: Self-pay

## 2019-07-01 DIAGNOSIS — M542 Cervicalgia: Secondary | ICD-10-CM

## 2019-07-01 MED ORDER — IOPAMIDOL (ISOVUE-M 300) INJECTION 61%
1.0000 mL | Freq: Once | INTRAMUSCULAR | Status: AC
  Administered 2019-07-01: 1 mL via EPIDURAL

## 2019-07-01 MED ORDER — TRIAMCINOLONE ACETONIDE 40 MG/ML IJ SUSP (RADIOLOGY)
60.0000 mg | Freq: Once | INTRAMUSCULAR | Status: AC
  Administered 2019-07-01: 60 mg via EPIDURAL

## 2019-07-01 NOTE — Discharge Instructions (Signed)
Spinal Injection Discharge Instruction Sheet  1. You may resume a regular diet and any medications that you routinely take, including pain medications.  2. No driving the rest of the day of the procedure.  3. Light activity throughout the rest of the day.  Do not do any strenuous work, exercise, bending or lifting.  The day following the procedure, you may resume normal physical activity but you should refrain from exercising or physical therapy for at least three days.   Common Side Effects:   Headaches- take your usual medications as directed by your physician.     Restlessness or inability to sleep- you may have trouble sleeping for the next few days.  Ask your referring physician if you need any medication for sleep if over the counter sleep medications do not help.   Facial flushing or redness- this should subside within a few days.   Increased pain- a temporary increase in pain a day or two following your procedure is not unusual.  Take your pain medication as prescribed by your referring physician.  You may use ice to the injection site as needed.  Please do not use heat for 24 hours.   Leg cramps  Please contact our office at [phone] for the following symptoms:  Fever greater than 100 degrees.  Headaches unresolved with medication after 2-3 days.  Increased swelling, pain, or redness at injection site.  Thank you for visiting our office. 

## 2019-07-02 ENCOUNTER — Encounter: Payer: Medicare Other | Admitting: Internal Medicine

## 2019-07-06 ENCOUNTER — Telehealth: Payer: Self-pay | Admitting: Neurology

## 2019-07-06 NOTE — Telephone Encounter (Signed)
Pt called wanting to discuss with RN or provider her neck pain that she is having not just in the back now but now it is going down the side and into her shoulders. Please advise.

## 2019-07-07 NOTE — Telephone Encounter (Signed)
It looks like rheumatology has referred her to physical therapy for her left shoulder. I am wondering if it may all be related. I am glad the injection was helpful.

## 2019-07-07 NOTE — Telephone Encounter (Signed)
I called pt back.  She stated that her upper back pain is so much better since the steroid injection last week.  She has had now for the last 2 days a discomfort in the L side of her L neck.  (she has had no trauma, or slept on it wrong).  She states that she says it feels stiff but is not.  Constant ache, level 3.  Takes gabapentin daily.  No otcs.  Alternating cold/hot, does not make much difference. Goes from L neck to shoulder (she states like its her artery??).  I relayed will forward message and once addressed will call her back (SS/NP out this am).

## 2019-07-08 ENCOUNTER — Telehealth (INDEPENDENT_AMBULATORY_CARE_PROVIDER_SITE_OTHER): Payer: Medicare Other | Admitting: Neurology

## 2019-07-08 ENCOUNTER — Other Ambulatory Visit: Payer: Self-pay

## 2019-07-08 ENCOUNTER — Encounter: Payer: Self-pay | Admitting: Neurology

## 2019-07-08 DIAGNOSIS — M542 Cervicalgia: Secondary | ICD-10-CM | POA: Diagnosis not present

## 2019-07-08 NOTE — Telephone Encounter (Signed)
I spoke to pt relayed that per SS/NP co-uld be related to her L shoulder.  (not started PT from rheumatology as yet). She wanted to be seen or VV.  I made appt 07-08-19 at 1545 VV to evaluate.

## 2019-07-08 NOTE — Progress Notes (Signed)
Virtual Visit via Video Note  I connected with [name] on 07/08/19 at  3:45 PM EST by a video enabled telemedicine application and verified that I am speaking with the correct person using two identifiers.  Location: Patient: At her home Provider: In the office   I discussed the limitations of evaluation and management by telemedicine and the availability of in person appointments. The patient expressed understanding and agreed to proceed.  History of Present Illness: 07/08/2019 SS: [name] is a 68 year old female with history of fibromyalgia, chronic neck pain, and paresthesias in her left hand.  She was sent for a cervical steroid epidural injection on 06/26/2019.  She indicates her upper back pain is much better since the injection.  She is now complaining of left shoulder pain and swelling and soreness to the base of the left side of her neck.  She says she has noticed it since the injection.  She does report it may have been there prior to the injection, but her neck pain was so significant she may have ignored it.  She denies dizziness, chest pain, headache, or slurred speech.  She has continued to complain of some chronic intermittent tingling in her left fingertips.  This sensation has not been significant enough to take any medications.  She has been alternating ice and heat.  It does not hurt to lift her arm.  She notices the sensation is worse when she lies down and moves around.  She did contact her rheumatologist who has ordered physical therapy, which she has yet to begin.  She presents today for follow-up via virtual visit  06/17/2019 SS: [name] is a 68 year old female with history of fibromyalgia, chronic neck pain, and paresthesias in her left hand.  CT scan of the cervical spine suggested spondylosis that could lead to left C5 nerve root impingement.  EMG and nerve conduction studies done previously did not show evidence of radiculopathy.  She presents today reporting  for 1 week, she has had persistent neck pain, paresthesia into the left arm, numbness/tingling in both hands, but worse on the left.  She has had this symptom presentation before, but had subsided.  She is not able to identify anything that may have triggered her symptoms.  She did see her rheumatologist on Monday of this week, and was given steroid and lidocaine injection in her bilateral trapezius muscles.  She has been taking baclofen, tramadol, and using diclofenac patches without benefit.  She denies any weakness to her arms or legs.  She has not had any falls.  She denies any changes to her bowels or bladder.  She presents today for follow-up unaccompanied.   Observations/Objective: Is alert and oriented, speech is clear and concise, follows commands, no obvious swelling noted to her neck, full ROM of cervical spine  Assessment and Plan: 1.  Left shoulder pain, subjective swelling to the base of the left side of her neck  Via virtual visit, I do not see any swelling to her neck.  I suggest that she give it a few more days, continue alternating ice and heat. May try baclofen to see if it helps any. If it does not improve, she may come into the office next week. At this time, I do not think it is anything related to her carotid artery as she has no other symptoms. It may be related to her fibromyalgia. Physical therapy has been ordered for her left shoulder pain by rheumatology. There is no report  of redness or warmth to suggest infection. She indicates the cervical epidural steroid injection went perfect, there were no complications and she is no longer having any pain to that area.   Follow Up Instructions: Call if symptoms do not improve or worsen in 3-7 days    I discussed the assessment and treatment plan with the patient. The patient was provided an opportunity to ask questions and all were answered. The patient agreed with the plan and demonstrated an understanding of the instructions.    The patient was advised to call back or seek an in-person evaluation if the symptoms worsen or if the condition fails to improve as anticipated.  I provided 15 minutes of non-face-to-face time during this encounter.  Evangeline Dakin, DNP  [facility] [address], [place], [place] [address] [phone]

## 2019-07-09 ENCOUNTER — Ambulatory Visit: Payer: Medicare Other | Admitting: Rheumatology

## 2019-07-09 NOTE — Progress Notes (Signed)
I have read the note, and I agree with the clinical assessment and plan.  Charles K Willis   

## 2019-07-11 ENCOUNTER — Other Ambulatory Visit: Payer: Self-pay | Admitting: Physician Assistant

## 2019-07-13 ENCOUNTER — Other Ambulatory Visit: Payer: Self-pay

## 2019-07-13 MED ORDER — PANTOPRAZOLE SODIUM 40 MG PO TBEC: 40.0000 mg | DELAYED_RELEASE_TABLET | Freq: Every day | ORAL | 1 refills | Status: DC

## 2019-07-13 NOTE — Progress Notes (Signed)
Sent under wrong provider. Dr. Hilarie Fredrickson patient

## 2019-07-19 LAB — CUP PACEART INCLINIC DEVICE CHECK
Brady Statistic RV Percent Paced: 1 % — CL
Date Time Interrogation Session: 20200728000000
HighPow Impedance: 38 Ohm
HighPow Impedance: 68 Ohm
Implantable Lead Implant Date: 20050603
Implantable Lead Location: 753860
Implantable Lead Model: 185
Implantable Lead Serial Number: 116340
Implantable Pulse Generator Implant Date: 20110505
Lead Channel Impedance Value: 625 Ohm
Lead Channel Pacing Threshold Amplitude: 0.7 V
Lead Channel Pacing Threshold Pulse Width: 0.4 ms
Lead Channel Sensing Intrinsic Amplitude: 11 mV
Lead Channel Setting Pacing Amplitude: 2.4 V
Lead Channel Setting Pacing Pulse Width: 0.4 ms
Lead Channel Setting Sensing Sensitivity: 0.4 mV
Pulse Gen Serial Number: 266301

## 2019-07-22 ENCOUNTER — Other Ambulatory Visit: Payer: Self-pay

## 2019-07-22 ENCOUNTER — Ambulatory Visit: Payer: Medicare Other | Attending: Rheumatology | Admitting: Physical Therapy

## 2019-07-22 ENCOUNTER — Encounter: Payer: Self-pay | Admitting: Physical Therapy

## 2019-07-22 DIAGNOSIS — M6281 Muscle weakness (generalized): Secondary | ICD-10-CM | POA: Insufficient documentation

## 2019-07-22 DIAGNOSIS — M542 Cervicalgia: Secondary | ICD-10-CM | POA: Insufficient documentation

## 2019-07-22 DIAGNOSIS — M545 Low back pain: Secondary | ICD-10-CM | POA: Insufficient documentation

## 2019-07-22 DIAGNOSIS — G8929 Other chronic pain: Secondary | ICD-10-CM | POA: Diagnosis present

## 2019-07-22 DIAGNOSIS — R293 Abnormal posture: Secondary | ICD-10-CM | POA: Diagnosis present

## 2019-07-22 NOTE — Patient Instructions (Signed)
Access Code: [email or web address]  URL: [email or web address]  Date: 07/22/2019  Prepared by: Raeford Razor   Exercises  Seated Scapular Retraction - 10 reps - 1 sets - 5 hold - 2x daily - 7x weekly  Supine Chin Tuck - 10 reps - 1 sets - 5 hold - 2x daily - 7x weekly  Seated Gentle Upper Trapezius Stretch - 3 reps - 1 sets - 30 hold - 2x daily - 7x weekly  Gentle Levator Scapulae Stretch - 3 reps - 1 sets - 30 hold - 2x daily - 7x weekly  Corner Pec Major Stretch - 3 reps - 1 sets - 30 hold - 2x daily - 7x weekly

## 2019-07-22 NOTE — Therapy (Signed)
[place], [place], [address] Phone: [phone]   Fax:  [phone]  Physical Therapy Evaluation  Patient Details  Name: [name] MRN: [id number] Date of Birth: 26-Nov-1950 Referring Provider (PT): Dr. Patrecia Pour   Encounter Date: 07/22/2019  PT End of Session - 07/22/19 1219    Visit Number  1    Number of Visits  16    Date for PT Re-Evaluation  09/16/19    PT Start Time  [id number]    PT Stop Time  1224    PT Time Calculation (min)  49 min    Activity Tolerance  Patient tolerated treatment well    Behavior During Therapy  [facility] for tasks assessed/performed       Past Medical History:  Diagnosis Date  . AICD (automatic cardioverter/defibrillator) present   . Anxiety   . Arthritis   . 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 Cerebral aneurysm, nonruptured    had a clip put in  . Clostridium difficile infection   . Depressive disorder, not elsewhere classified   . Diverticulosis of colon (without mention of hemorrhage)   . Esophageal reflux   . Fibromyalgia   . Gastritis   . GERD (gastroesophageal reflux disease)   . GI bleed 2004  . Hiatal hernia   . Hyperlipidemia   . Hypertension   . Hypothyroidism   . Internal hemorrhoids   . Obstructive sleep apnea (adult) (pediatric)   . Ostium secundum type atrial septal defect   . Other chronic nonalcoholic liver disease   . Other pulmonary embolism and infarction   . Paroxysmal ventricular tachycardia ([place])   . Personal history of radiation therapy   . Pneumonia    history  . Presence of permanent cardiac pacemaker   . PUD (peptic ulcer disease)   . Radiation 02/03/15-03/10/15   Right Breast  . Sarcoid    per pt , not sure  . Schatzki's ring   . Stroke [facility]) 2013   tia/ pt feels it was around 2008 0r 2009  . Takotsubo syndrome   . Tubular adenoma of colon   . Unspecified transient cerebral ischemia   . Unspecified  vitamin D deficiency     Past Surgical History:  Procedure Laterality Date  . ABI  2006   normal  . BREAST EXCISIONAL BIOPSY    . BREAST LUMPECTOMY WITH RADIOACTIVE SEED LOCALIZATION Right 01/03/2015   Procedure: BREAST LUMPECTOMY WITH RADIOACTIVE SEED LOCALIZATION;  Surgeon: Autumn Messing III, MD;  Location: [facility];  Service: General;  Laterality: Right;  . CARDIAC DEFIBRILLATOR PLACEMENT  2006; 2012   BSX single chamber ICD  . carotid dopplers  2006   neg  . CEREBRAL ANEURYSM REPAIR  02/1999  . COLONOSCOPY    . hospitalization  2004   GI bleed, PUD, diverticulosis (EGD,colonscopy)  . hospitalization     PE, NSVT, s/p defib  . KNEE ARTHROSCOPY     bilateral  . LEFT HEART CATHETERIZATION WITH CORONARY ANGIOGRAM N/A 02/22/2012   Procedure: LEFT HEART CATHETERIZATION WITH CORONARY ANGIOGRAM;  Surgeon: Burnell Blanks, MD;  Location: [facility] CATH LAB;  Service: Cardiovascular;  Laterality: N/A;  . PARTIAL HYSTERECTOMY     Fibroids  . TONGUE BIOPSY  12/12/2017   due to sore tongue and white patches/abnormal cells  . TOTAL KNEE ARTHROPLASTY Left 02/13/2016   Procedure: TOTAL KNEE ARTHROPLASTY;  Surgeon: Vickey Huger, MD;  Location: [facility];  Service: Orthopedics;  Laterality: Left;  . UPPER GASTROINTESTINAL ENDOSCOPY      There were no vitals filed for this visit.   Subjective Assessment - 07/22/19 1140    Subjective  Pt was referred to PT after receiving an injection in cervical spine about 3 weeks ago.  She reports more than 50% improvement with the injection.  She feels tightness in L side of her neck and into top of her shoulder.  Occasionally numbness in finger tips in L hand (digits 1-3). When she uses her arms she has increased pain over her head.  She feels slightly weaker in Upper body.    Diagnostic tests  CT 2019 cervical spine showed    Patient Stated Goals  Pt wants to avoid having this pain again.    Currently in Pain?  Yes    Pain Score  3     Pain Location  Neck    Pain  Orientation  Left    Pain Descriptors / Indicators  Tightness    Pain Type  Chronic pain    Pain Radiating Towards  L shoulder    Pain Onset  More than a month ago    Pain Frequency  Constant    Aggravating Factors   using arms, turning head    Pain Relieving Factors  the injection, meds, ice/heat    Effect of Pain on Daily Activities  does what she needs to do but it is painful    Multiple Pain Sites  No         [facility] PT Assessment - 07/22/19 0001      Assessment   Medical Diagnosis  chronic L shoulder pain     Referring Provider (PT)  Dr. Patrecia Pour    Onset Date/Surgical Date  --   acute on chronic    Prior Therapy  Yes but knee       Precautions   Precautions  None;ICD/Pacemaker      Restrictions   Weight Bearing Restrictions  No      Balance Screen   Has the patient fallen in the past 6 months  No   has almost fallen a few times    Has the patient had a decrease in activity level because of a fear of falling?   Yes    Is the patient reluctant to leave their home because of a fear of falling?   No      Home Environment   Living Environment  Private residence    Living Arrangements  Alone    Type of [place] to enter    Entrance Stairs-Number of Steps  Carl  One level    Additional Comments  has ramp       Prior Function   Level of Independence  Independent    Vocation  Retired    Leisure  work with [employer], stays home most of the time       Cognition   Overall Cognitive Status  Within Functional Limits for tasks assessed      Observation/Other Assessments   Focus on Therapeutic Outcomes (FOTO)   46%      Sensation   Light Touch  Appears Intact      Posture/Postural Control   Posture/Postural Control  Postural limitations    Postural Limitations  Rounded Shoulders;Forward head      AROM  Cervical Flexion  35    Cervical Extension  41    Cervical - Right Side Bend  25     Cervical - Left Side Bend  14    Cervical - Right Rotation  55    Cervical - Left Rotation  30      Strength   Right Shoulder Flexion  4-/5    Right Shoulder ABduction  4/5    Left Shoulder Flexion  4-/5    Left Shoulder ABduction  3+/5    Right Elbow Flexion  4+/5    Right Elbow Extension  4+/5    Left Elbow Flexion  4/5    Left Elbow Extension  4/5      Palpation   Spinal mobility  hypomobile throughout cervical spine     Palpation comment  painful along L lateral suboccipitals and into lateral cervicals into L levator and upper trap, into medial border of scapula                Objective measurements completed on examination: See above findings.      [facility] Adult PT Treatment/Exercise - 07/22/19 0001      Neck Exercises: Supine   Neck Retraction  10 reps;5 secs    Other Supine Exercise  scapualr retraction x 10       Manual Therapy   Manual Therapy  Soft tissue mobilization    Soft tissue mobilization  cervicals, L upper trap and L levator scap, tender/trigger points throughout, well tolerated      Neck Exercises: Stretches   Upper Trapezius Stretch  3 reps    Levator Stretch  3 reps             PT Education - 07/22/19 1237    Education Details  PT/POC, anatomy , cervical spine, HEP    Person(s) Educated  Patient    Methods  Explanation;Demonstration;Handout    Comprehension  Verbalized understanding;Returned demonstration;Verbal cues required       PT Short Term Goals - 07/22/19 1238      PT SHORT TERM GOAL #1   Title  Pt will be I with basic HEP    Time  4    Period  Weeks    Status  New    Target Date  08/19/19      PT SHORT TERM GOAL #2   Title  Pt will be able to get in and out of bed with less strain on neck, difficulty in UEs.    Time  4    Period  Weeks    Status  New    Target Date  08/19/19      PT SHORT TERM GOAL #3   Title  Pt will show corrected posture and report awareness in sitting and with functional activity    Time   4    Period  Weeks    Status  New    Target Date  08/19/19        PT Long Term Goals - 07/22/19 1239      PT LONG TERM GOAL #1   Title  Pt will be able to improve FOTO score to less than 40% impaired    Time  8    Period  Weeks    Status  New    Target Date  09/16/19      PT LONG TERM GOAL #2   Title  Pt will be able to rotate cervical spine to L 50 deg or more  for improved driving and interaction with environment    Time  8    Period  Weeks    Status  New    Target Date  09/16/19      PT LONG TERM GOAL #3   Title  Pt will be able to reach, lift light items in her home without increased neck pain.    Time  8    Period  Weeks    Status  New    Target Date  09/16/19      PT LONG TERM GOAL #4   Title  Pt will be I with more advanced HEP for UE strength and cervical AROM as of last visit.    Time  8    Period  Weeks    Status  New    Target Date  09/16/19             Plan - 07/22/19 1242    Clinical Impression Statement  Patient presents to PT eval for neck pain which has been a chronic problem for >2 yrs.  Eval is of mod complexity.  Her neck pain began in 2018 but about 3 mos ago, she had more severe pain without known injury.  She has had relief with spinal injection. She cont to have cervical spine stiffness, muscle pain, min headaches and difficulty using her UE for functional tasks.   I expect her to improve but may have some continued stiffness in her neck due to the chronicity of her condition,    Personal Factors and Comorbidities  Age;Comorbidity 1;Past/Current Experience;Comorbidity 2    Comorbidities  pacemaker, TIA, knee surgery, other see snapshot    Examination-Activity Limitations  Sleep;Sit;Locomotion Level;Lift;Transfers    Examination-Participation Restrictions  Church;Community Activity;Meal Prep;Interpersonal Relationship;Cleaning    Stability/Clinical Decision Making  Evolving/Moderate complexity    Clinical Decision Making  Moderate    Rehab  Potential  Good    PT Frequency  2x / week    PT Duration  8 weeks    PT Treatment/Interventions  ADLs/Self Care Home Management;Therapeutic activities;Patient/family education;Taping;Therapeutic exercise;Moist Heat;Cryotherapy;Traction;Ultrasound;Functional mobility training;Neuromuscular re-education;Manual techniques;Dry needling;Passive range of motion    PT Next Visit Plan  check HEP, posture education, modalities as needed- soft tissue L side UE and neck    PT Home Exercise Plan  chin tuck, scap squeeze, UT and LS stretch , corener stretch    Consulted and Agree with Plan of Care  Patient       Patient will benefit from skilled therapeutic intervention in order to improve the following deficits and impairments:  Increased fascial restricitons, Impaired sensation, Pain, Postural dysfunction, Decreased mobility, Decreased range of motion, Decreased strength, Impaired UE functional use, Obesity, Impaired flexibility, Decreased balance  Visit Diagnosis: Cervicalgia  Muscle weakness (generalized)  Abnormal posture     Problem List Patient Active Problem List   Diagnosis Date Noted  . Pre-op exam 01/21/2018  . Neck pain 10/25/2017  . Paresthesias in left hand 10/25/2017  . Paresthesia of foot, bilateral 04/12/2017  . Primary osteoarthritis of both hands 09/27/2016  . Primary osteoarthritis of both knees 09/27/2016  . Urinary frequency 09/26/2016  . Screen for STD (sexually transmitted disease) 09/26/2016  . 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  . Nasopharyngitis, 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/2016  . Postnasal drip 05/18/2016  . S/P total knee replacement 02/13/2016  . Chronic pain of both knees 12/06/2015  . Globus pharyngeus 05/17/2015  . Productive cough 05/17/2015  . Genetic testing 12/23/2014  . Family history of breast  cancer   . Family history of colon cancer   . Family history of pancreatic cancer   . Breast cancer of upper-outer quadrant of right female breast ([place])  11/23/2014  . Allergy to ACE inhibitors 09/27/2014  . Encounter for Medicare annual wellness exam 06/27/2014  . Prediabetes 06/23/2013  . Routine general medical examination at a health care facility 06/23/2013  . RLS (restless legs syndrome) 01/15/2013  . Hx of Clostridium difficile infection 10/10/2012  . Bronchiectasis with acute exacerbation ([place]) 04/29/2012  . Dyspnea on exertion 04/02/2012  . Non-ischemic cardiomyopathy ([place]) 03/07/2012  . Colon cancer screening 01/11/2012  . Post-menopausal 01/11/2012  . Chest pain 11/07/2011  . Palpitations 04/17/2011  . Abnormal tympanic membrane 01/22/2011  . Obstructive sleep apnea 04/04/2010  . VENTRICULAR TACHYCARDIA 01/03/2009  . ICD  [employer]  Single chamber 01/03/2009  . PATENT FORAMEN OVALE 09/29/2008  . Vitamin D deficiency 08/17/2008  . Hypothyroidism 11/18/2006  . Hyperlipidemia 11/18/2006  . Depression with anxiety 11/18/2006  . Essential hypertension 11/18/2006  . MITRAL VALVE PROLAPSE 11/18/2006  . CEREBRAL ANEURYSM 11/18/2006  . Allergic rhinitis 11/18/2006  . GERD 11/18/2006  . DIVERTICULOSIS, COLON 11/18/2006  . Fatty liver 11/18/2006  . Fibromyalgia 11/18/2006    Kendarious Gudino 07/22/2019, 12:51 PM  [facility] [address] [place], [place], [address] Phone: [phone]   Fax:  [phone]  Name: JOANY HASLETT MRN: [id number] Date of Birth: [birth year]   Raeford Razor, PT 07/22/19 12:51 PM Phone: [phone] Fax: [phone]

## 2019-07-27 ENCOUNTER — Ambulatory Visit: Payer: Medicare Other | Admitting: Physical Therapy

## 2019-07-29 ENCOUNTER — Encounter: Payer: Self-pay | Admitting: Physical Therapy

## 2019-07-29 ENCOUNTER — Other Ambulatory Visit: Payer: Self-pay

## 2019-07-29 ENCOUNTER — Ambulatory Visit: Payer: Medicare Other | Admitting: Physical Therapy

## 2019-07-29 DIAGNOSIS — R293 Abnormal posture: Secondary | ICD-10-CM

## 2019-07-29 DIAGNOSIS — M542 Cervicalgia: Secondary | ICD-10-CM

## 2019-07-29 DIAGNOSIS — M6281 Muscle weakness (generalized): Secondary | ICD-10-CM

## 2019-07-29 NOTE — Therapy (Signed)
[place], [place], [address] Phone: [phone]   Fax:  [phone]  Physical Therapy Treatment I connected with  BLYSS POGUE on 07/29/19 by a video enabled telemedicine application and verified that I am speaking with the correct person using two identifiers.   I discussed the limitations of evaluation and management by telemedicine. The patient expressed understanding and agreed to proceed.   Patient Details  Name: [name] MRN: [id number] Date of Birth: 1951-06-14 Referring Provider (PT): Dr. Patrecia Pour   Encounter Date: 07/29/2019  PT End of Session - 07/29/19 1152    Visit Number  2    Number of Visits  16    Date for PT Re-Evaluation  09/16/19    Authorization Type  MCR   PN at visit 10    PT Start Time  1101    PT Stop Time  1141    PT Time Calculation (min)  40 min    Activity Tolerance  Patient tolerated treatment well    Behavior During Therapy  [facility] for tasks assessed/performed       Past Medical History:  Diagnosis Date  . AICD (automatic cardioverter/defibrillator) present   . Anxiety   . Arthritis   . 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 Cerebral aneurysm, nonruptured    had a clip put in  . Clostridium difficile infection   . Depressive disorder, not elsewhere classified   . Diverticulosis of colon (without mention of hemorrhage)   . Esophageal reflux   . Fibromyalgia   . Gastritis   . GERD (gastroesophageal reflux disease)   . GI bleed 2004  . Hiatal hernia   . Hyperlipidemia   . Hypertension   . Hypothyroidism   . Internal hemorrhoids   . Obstructive sleep apnea (adult) (pediatric)   . Ostium secundum type atrial septal defect   . Other chronic nonalcoholic liver disease   . Other pulmonary embolism and infarction   . Paroxysmal ventricular tachycardia ([place])   . Personal history of radiation therapy   . Pneumonia    history  .  Presence of permanent cardiac pacemaker   . PUD (peptic ulcer disease)   . Radiation 02/03/15-03/10/15   Right Breast  . Sarcoid    per pt , not sure  . Schatzki's ring   . Stroke [facility]) 2013   tia/ pt feels it was around 2008 0r 2009  . Takotsubo syndrome   . Tubular adenoma of colon   . Unspecified transient cerebral ischemia   . Unspecified vitamin D deficiency     Past Surgical History:  Procedure Laterality Date  . ABI  2006   normal  . BREAST EXCISIONAL BIOPSY    . BREAST LUMPECTOMY WITH RADIOACTIVE SEED LOCALIZATION Right 01/03/2015   Procedure: BREAST LUMPECTOMY WITH RADIOACTIVE SEED LOCALIZATION;  Surgeon: Autumn Messing III, MD;  Location: [place];  Service: General;  Laterality: Right;  . CARDIAC DEFIBRILLATOR PLACEMENT  2006; 2012   BSX single chamber ICD  . carotid dopplers  2006   neg  . CEREBRAL ANEURYSM REPAIR  02/1999  . COLONOSCOPY    . hospitalization  2004   GI bleed, PUD, diverticulosis (EGD,colonscopy)  . hospitalization     PE, NSVT, s/p defib  . KNEE ARTHROSCOPY     bilateral  . LEFT HEART CATHETERIZATION WITH CORONARY ANGIOGRAM N/A 02/22/2012   Procedure: LEFT HEART CATHETERIZATION WITH CORONARY ANGIOGRAM;  Surgeon: Harrell Gave  Santina Evans, MD;  Location: [place] CATH LAB;  Service: Cardiovascular;  Laterality: N/A;  . PARTIAL HYSTERECTOMY     Fibroids  . TONGUE BIOPSY  12/12/2017   due to sore tongue and white patches/abnormal cells  . TOTAL KNEE ARTHROPLASTY Left 02/13/2016   Procedure: TOTAL KNEE ARTHROPLASTY;  Surgeon: Vickey Huger, MD;  Location: [place];  Service: Orthopedics;  Laterality: Left;  . UPPER GASTROINTESTINAL ENDOSCOPY      There were no vitals filed for this visit.  Subjective Assessment - 07/29/19 1151    Subjective  N/T in hand is okay. My hip is what is bothering me today. 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    Patient Stated Goals  Pt wants to avoid having this pain again.                       [place] Adult PT  Treatment/Exercise - 07/29/19 0001      Neck Exercises: Seated   Neck Retraction Limitations  chin tucks    Cervical Rotation Limitations  AROM & paired with chin tucks    Shoulder Rolls Limitations  multiple rounds through appointment    Shoulder Flexion Limitations  pulling out on pillow case    Postural Training  scapular retraction & spine alignment in seated    Other Seated Exercise  GHJ ER, elbows at 90      Neck Exercises: Stretches   Upper Trapezius Stretch  Right;Left;30 seconds    Levator Stretch  Right;Left;30 seconds    Lower Cervical/Upper Thoracic Stretch  Other (comment)   thoracic extension   Other Neck Stretches  rotation SNAG    Other Neck Stretches  cervical rotation with hand on Lt upper trap; L stretch seated             PT Education - 07/29/19 1152    Education Details  posture, HEP program, connection to LBP/hip pain    Person(s) Educated  Patient    Methods  Explanation;Demonstration;Verbal cues;Handout    Comprehension  Verbalized understanding;Returned demonstration;Verbal cues required;Need further instruction       PT Short Term Goals - 07/22/19 1238      PT SHORT TERM GOAL #1   Title  Pt will be I with basic HEP    Time  4    Period  Weeks    Status  New    Target Date  08/19/19      PT SHORT TERM GOAL #2   Title  Pt will be able to get in and out of bed with less strain on neck, difficulty in UEs.    Time  4    Period  Weeks    Status  New    Target Date  08/19/19      PT SHORT TERM GOAL #3   Title  Pt will show corrected posture and report awareness in sitting and with functional activity    Time  4    Period  Weeks    Status  New    Target Date  08/19/19        PT Long Term Goals - 07/22/19 1239      PT LONG TERM GOAL #1   Title  Pt will be able to improve FOTO score to less than 40% impaired    Time  8    Period  Weeks    Status  New    Target Date  09/16/19  PT LONG TERM GOAL #2   Title  Pt will be able to  rotate cervical spine to L 50 deg or more for improved driving and interaction with environment    Time  8    Period  Weeks    Status  New    Target Date  09/16/19      PT LONG TERM GOAL #3   Title  Pt will be able to reach, lift light items in her home without increased neck pain.    Time  8    Period  Weeks    Status  New    Target Date  09/16/19      PT LONG TERM GOAL #4   Title  Pt will be I with more advanced HEP for UE strength and cervical AROM as of last visit.    Time  8    Period  Weeks    Status  New    Target Date  09/16/19            Plan - 07/29/19 1144    Clinical Impression Statement  Lt rotation visually 50% of Rt cervical rotation. Complains of ipsilateral low back and hip pain- postural alignment discussed and effects neck can have on back pain as well as HA. would like to continue with [place] visits due to health concerns and discussed possibility of in person at a later day for STM PRN. good tolerance to exercises today with fatigue noted in overhead motion.    PT Treatment/Interventions  ADLs/Self Care Home Management;Therapeutic activities;Patient/family education;Taping;Therapeutic exercise;Moist Heat;Cryotherapy;Traction;Ultrasound;Functional mobility training;Neuromuscular re-education;Manual techniques;Dry needling;Passive range of motion    PT Home Exercise Plan  [id number]  chin tuck, scap squeeze, UT and LS stretch , corener stretch, rotation SNAG, GHJ flx with pillow case, L stretch seated    Consulted and Agree with Plan of Care  Patient       Patient will benefit from skilled therapeutic intervention in order to improve the following deficits and impairments:  Increased fascial restricitons, Impaired sensation, Pain, Postural dysfunction, Decreased mobility, Decreased range of motion, Decreased strength, Impaired UE functional use, Obesity, Impaired flexibility, Decreased balance  Visit Diagnosis: Cervicalgia  Muscle weakness  (generalized)  Abnormal posture     Problem List Patient Active Problem List   Diagnosis Date Noted  . Pre-op exam 01/21/2018  . Neck pain 10/25/2017  . Paresthesias in left hand 10/25/2017  . Paresthesia of foot, bilateral 04/12/2017  . Primary osteoarthritis of both hands 09/27/2016  . Primary osteoarthritis of both knees 09/27/2016  . Urinary frequency 09/26/2016  . Screen for STD (sexually transmitted disease) 09/26/2016  . 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  . Nasopharyngitis, 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/2016  . Postnasal drip 05/18/2016  . S/P total knee replacement 02/13/2016  . Chronic pain of both knees 12/06/2015  . Globus pharyngeus 05/17/2015  . Productive cough 05/17/2015  . Genetic testing 12/23/2014  . Family history of breast cancer   . Family history of colon cancer   . Family history of pancreatic cancer   . Breast cancer of upper-outer quadrant of right female breast ([place]) 11/23/2014  . Allergy to ACE inhibitors 09/27/2014  . Encounter for Medicare annual wellness exam 06/27/2014  . Prediabetes 06/23/2013  . Routine general medical examination at a health care facility 06/23/2013  . RLS (restless legs syndrome) 01/15/2013  . Hx of Clostridium difficile infection 10/10/2012  . Bronchiectasis with acute exacerbation ([place]) 04/29/2012  . Dyspnea on exertion 04/02/2012  . Non-ischemic cardiomyopathy ([place])  03/07/2012  . Colon cancer screening 01/11/2012  . Post-menopausal 01/11/2012  . Chest pain 11/07/2011  . Palpitations 04/17/2011  . Abnormal tympanic membrane 01/22/2011  . Obstructive sleep apnea 04/04/2010  . VENTRICULAR TACHYCARDIA 01/03/2009  . ICD  [employer]  Single chamber 01/03/2009  . PATENT FORAMEN OVALE 09/29/2008  . Vitamin D deficiency 08/17/2008  . Hypothyroidism 11/18/2006  . Hyperlipidemia 11/18/2006  . Depression with anxiety 11/18/2006  . Essential hypertension 11/18/2006  . MITRAL VALVE PROLAPSE 11/18/2006  . CEREBRAL ANEURYSM  11/18/2006  . Allergic rhinitis 11/18/2006  . GERD 11/18/2006  . DIVERTICULOSIS, COLON 11/18/2006  . Fatty liver 11/18/2006  . Fibromyalgia 11/18/2006   Anquanette Bahner C. Geraldine Tesar PT, DPT 07/29/19 11:54 AM   [facility] [address] [place], [place], [address] Phone: [phone]   Fax:  [phone]  Name: [name] MRN: [id number] Date of Birth: August 25, 1950

## 2019-08-03 ENCOUNTER — Encounter: Payer: Medicare Other | Admitting: Physical Therapy

## 2019-08-04 ENCOUNTER — Ambulatory Visit (INDEPENDENT_AMBULATORY_CARE_PROVIDER_SITE_OTHER): Payer: Medicare Other | Admitting: Pulmonary Disease

## 2019-08-04 ENCOUNTER — Other Ambulatory Visit: Payer: Self-pay

## 2019-08-04 ENCOUNTER — Other Ambulatory Visit: Payer: Self-pay | Admitting: Nurse Practitioner

## 2019-08-04 ENCOUNTER — Encounter: Payer: Self-pay | Admitting: Pulmonary Disease

## 2019-08-04 VITALS — BP 120/68 | HR 67 | Temp 97.1°F | Ht 66.5 in | Wt 225.8 lb

## 2019-08-04 DIAGNOSIS — G4733 Obstructive sleep apnea (adult) (pediatric): Secondary | ICD-10-CM | POA: Diagnosis not present

## 2019-08-04 DIAGNOSIS — J471 Bronchiectasis with (acute) exacerbation: Secondary | ICD-10-CM

## 2019-08-04 DIAGNOSIS — Z9989 Dependence on other enabling machines and devices: Secondary | ICD-10-CM | POA: Diagnosis not present

## 2019-08-04 MED ORDER — DOXYCYCLINE HYCLATE 100 MG PO TABS: 100.0000 mg | ORAL_TABLET | Freq: Two times a day (BID) | ORAL | 0 refills | Status: DC

## 2019-08-04 MED ORDER — SODIUM CHLORIDE 3 % IN NEBU: INHALATION_SOLUTION | Freq: Two times a day (BID) | RESPIRATORY_TRACT | 12 refills | Status: DC | PRN

## 2019-08-04 NOTE — Patient Instructions (Signed)
Doxycycline 100 mg twice per day for 7 days  Hypertonic saline nebulizer once or twice per day as needed to help loosen chest congestion  Will change CPAP to 9 cm water  Follow up in 2 months

## 2019-08-04 NOTE — Progress Notes (Signed)
Pulmonary, Critical Care, and Sleep Medicine  Chief Complaint  Patient presents with  . Follow-up    Patient is here for follow up. Patient is experiencing shortness of breath with exertion also has dry cough. Patient is having issues with CPAP. Patient is not sleeping good and wakes up with it on the floor    Constitutional:  BP 120/68 (BP Location: Right Arm, Patient Position: Sitting, Cuff Size: Large)   Pulse 67   Temp (!) 97.1 F (36.2 C) (Temporal)   Ht 5' 6.5" (1.689 m)   Wt 225 lb 12.8 oz (102.4 kg)   LMP  (LMP Unknown)   SpO2 97% Comment: on RA  BMI 35.90 kg/m   Past Medical History:  Vit D deficiency, Takotsubo CM, Schatzki's ring, PUD, VT s/p AICD, PE, NASH, ASD, Hypothyroidism, HTN, HLD, HH, GERD, Fibromyalgia, Diverticulosis, Depression, C diff, Cerebral aneurysm, Breast cancer 2016, OA  Brief Summary:  [name] is a 68 y.o. female former smoker with bronchiectasis and obstructive sleep apnea.  She has noticed more chest congestion over the past week or so.  Having trouble bringing up sputum.  Not having fever, or hemoptysis.  Sinuses okay. Not having sore throat, dry mouth, or headache.  Has been using mucinex once per day.  Started using albuterol at night and flutter valve more.    She feels like her CPAP isn't working as well as before.  She doesn't feel like she is getting enough pressure and has trouble exhaling when on CPAP.  Physical Exam:   Appearance - well kempt   ENMT - no sinus tenderness, no nasal discharge, no oral exudate  Neck - no masses, trachea midline, no thyromegaly, no elevation in JVP  Respiratory - normal appearance of chest wall, normal respiratory effort w/o accessory muscle use, no dullness on percussion, no wheezing or rales  CV - s1s2 regular rate and rhythm, no murmurs, no peripheral edema, radial pulses symmetric  GI - soft, non tender  Lymph - no adenopathy noted in neck and axillary areas  MSK - normal gait   Ext - no cyanosis, clubbing, or joint inflammation noted  Skin - no rashes, lesions, or ulcers  Neuro - normal strength, oriented x 3  Psych - normal mood and affect   Assessment/Plan:   Bronchiectasis. - she is in the early stages of another exacerbation - will give her course of doxycycline - don't think she needs prednisone at this time - advised her to use mucinex bid - will have her use hypertonic saline bid prn - continue prn albuterol - continue flutter valve, and chest vest - last received pneumovax in November 2015 >> will need booster at next visit  Upper airway cough syndrome with allergic rhinitis. - continue allegra, flonase, singulair - she uses Navage for nasal irrigation  Obstructive sleep apnea. - she is compliant with CPAP - download shows AHI higher - will change CPAP from 8 to 9 cm H2O  Advice about COVID 19. - discussed vaccines, and known side effect profile  Ventral hernia with lower abdominal discomfort. - she will f/u with Dr. Hilarie Fredrickson and Dr. [name]   Patient Instructions  Doxycycline 100 mg twice per day for 7 days  Hypertonic saline nebulizer once or twice per day as needed to help loosen chest congestion  Will change CPAP to 9 cm water  Follow up in 2 months   A total of  29 minutes were spent face to face with the patient and  more than half of that time involved counseling or coordination of care.  Chesley Mires, MD [place] [facility]/Critical Care Pager: [phone] 08/04/2019, 11:41 AM  Flow Sheet     Pulmonary tests:  PFT 04/17/12 >> FEV1 2.60 (110%), FEV1% 85, TLC 4.05 (77%), DLCO 44% PFT 01/17/17 >> FEV1 2.38 (111%), FEV1% 90, TLC 3.85 (71%), DLCO 58%, no BD RAST 05/30/17 >> dust mites PFT 10/16/18 >> FEV1 2.27 (111%), FEV1% 90, TLC 4.00 (74%), DLCO 59%  Serology:  04/02/12 >> ACE 1 10/25/15 >> ESR 40, ACE 32 05/30/17 >> IgA, IgG, IgM normal 10/16/18 >> HP panel negative, ACE 25  Chest imaging:  CT chest 04/10/12 >> b/l  lower lobe cylindrical BTX and some in upper lobes CT angio chest 10/26/15 >> patchy GGO in periphery of lungs b/l, basilar BTX, no PE; no significant change compared to 2013 HRCT chest 10/08/18 >> atherosclerosis, basilar predominant cylindrical BTX, mild centrilobular/paraseptal emphysema, air trapping CT angio chest 03/24/19 >> Chronic lung changes with peribronchial thickening, bibasilar atelectasis and chronic interstitial disease/peripheral fibrosis at the lung bases  Sleep tests:  PSG 11/29/08 >> AHI 9 HST 11/10/15 >> AHI 7.1, SaO2 low 75% CPAP 07/04/19 to 08/02/19 >> used on 26 of 30 nights with average 4 hrs 26 min.  Average AHI 7.1 with CPAP 8 cm H2O.  Cardiac tests:  Echo9/24/18 >> mild LVH, EF 55 to 60%, grade 1 DD, mild TR  Medications:   Allergies as of 08/04/2019      Reactions   Ace Inhibitors Swelling   Angioedema; makes tongue "break out"   Amitriptyline Hcl Other (See Comments)    makes her too sleepy!   Atorvastatin Other (See Comments)   SEVERE MYALGIA   Cymbalta [duloxetine Hcl] Nausea Only, Other (See Comments)   Sleepiness/ sick   Dilantin [phenytoin Sodium Extended] Rash   Severe rash   Paroxetine Nausea Only   Rapid heartbeat   Ramipril Other (See Comments)   TONGUE ULCERS   Rosuvastatin Other (See Comments)   SEVERE MYALGIA   Carbamazepine Rash   Codeine Itching   Iodine-131 Other (See Comments)   Swelling at IV site only, no swelling or SOB around mouth.!   Phenytoin Sodium Extended Rash   Wellbutrin [bupropion] Palpitations      Medication List       Accurate as of August 04, 2019 11:41 AM. If you have any questions, ask your nurse or doctor.        STOP taking these medications   dicyclomine 10 MG capsule Commonly known as: BENTYL Stopped by: Chesley Mires, MD     TAKE these medications   albuterol (2.5 MG/3ML) 0.083% nebulizer solution Commonly known as: PROVENTIL Take 3 mLs (2.5 mg total) by nebulization every 6 (six) hours as  needed for wheezing or shortness of breath.   albuterol 108 (90 Base) MCG/ACT inhaler Commonly known as: Ventolin HFA Inhale 2 puffs into the lungs every 6 (six) hours as needed for wheezing or shortness of breath.   aspirin 81 MG tablet Take 81 mg by mouth daily.   baclofen 10 MG tablet Commonly known as: LIORESAL TAKE 1 TABLET THREE TIMES DAILY What changed:   how to take this  when to take this   calcium carbonate 1250 (500 Ca) MG tablet Commonly known as: OS-CAL - dosed in mg of elemental calcium Take 1 tablet by mouth.   carvedilol 12.5 MG tablet Commonly known as: COREG Take 1 tablet (12.5 mg total) by mouth 2 (two) times  daily.   cholecalciferol 1000 units tablet Commonly known as: VITAMIN D Take 5,000 Units by mouth daily.   diclofenac 1.3 % Ptch Commonly known as: FLECTOR Place 1 patch onto the skin 2 (two) times daily.   fexofenadine 180 MG tablet Commonly known as: ALLEGRA Take 180 mg by mouth daily as needed for allergies or rhinitis.   fluticasone 50 MCG/ACT nasal spray Commonly known as: FLONASE Place 2 sprays into both nostrils daily as needed for allergies or rhinitis.   Flutter Devi 1 Device by Does not apply route as needed.   furosemide 40 MG tablet Commonly known as: Lasix Take 1 tablet (40 mg total) by mouth every other day. What changed:   how much to take  when to take this   gabapentin 300 MG capsule Commonly known as: NEURONTIN Take 1 capsule (300 mg total) by mouth 2 (two) times daily.   hydroxypropyl methylcellulose / hypromellose 2.5 % ophthalmic solution Commonly known as: ISOPTO TEARS / GONIOVISC Place 1 drop into both eyes daily.   latanoprost 0.005 % ophthalmic solution Commonly known as: XALATAN Place 1 drop into both eyes at bedtime.   levothyroxine 50 MCG tablet Commonly known as: SYNTHROID Take 1 tablet (50 mcg total) by mouth daily.   Magnesium Oxide 400 (240 Mg) MG Tabs Take 1 tablet (400 mg total) by mouth  daily.   mometasone 0.1 % lotion Commonly known as: ELOCON Apply 1 application topically daily as needed (rash).   montelukast 10 MG tablet Commonly known as: SINGULAIR TAKE 1 TABLET AT BEDTIME   MULTIVITAMIN PO Take 1 tablet by mouth daily.   pantoprazole 40 MG tablet Commonly known as: PROTONIX Take 1 tablet (40 mg total) by mouth daily.   polyethylene glycol powder 17 GM/SCOOP powder Commonly known as: GLYCOLAX/MIRALAX Take 17 grams by mouth daily   potassium chloride 10 MEQ tablet Commonly known as: KLOR-CON TAKE 1/2 TABLET (5MEQ TOTAL) EVERY DAY   simvastatin 40 MG tablet Commonly known as: ZOCOR TAKE 1/2 TABLET (20MG) EVERY DAY   traMADol 50 MG tablet Commonly known as: ULTRAM Take 1 tablet (50 mg total) by mouth at bedtime.   traMADol 50 MG tablet Commonly known as: ULTRAM Take 1 tablet (50 mg total) by mouth 3 (three) times daily as needed. For neck pain.   vitamin C 1000 MG tablet Take 1,000 mg by mouth daily.   zinc gluconate 50 MG tablet Take 50 mg by mouth daily.   zolpidem 10 MG tablet Commonly known as: AMBIEN TAKE 1 TABLET BY MOUTH AT BEDTIME AS NEEDED FOR SLEEP       Past Surgical History:  She  has a past surgical history that includes Partial hysterectomy; Cerebral aneurysm repair (02/1999); Knee arthroscopy; Cardiac defibrillator placement (2006; 2012); carotid dopplers (2006); hospitalization (2004); hospitalization; ABI (2006); left heart catheterization with coronary angiogram (N/A, 02/22/2012); Breast lumpectomy with radioactive seed localization (Right, 01/03/2015); Total knee arthroplasty (Left, 02/13/2016); Breast excisional biopsy; Tongue Biopsy (12/12/2017); Colonoscopy; and Upper gastrointestinal endoscopy.  Family History:  Her family history includes Allergies in her sister; Alzheimer's disease in her mother; Breast cancer in her cousin and maternal aunt; Breast cancer (age of onset: 102) in her maternal aunt; Cancer (age of onset: 51) in  her maternal grandmother; Cardiomyopathy in an other family member; Colon cancer (age of onset: 54) in her maternal aunt; Diabetes in her mother; Heart disease in her brother, father, sister, and son; Lung cancer in her maternal uncle; Lung cancer (age of onset: 53)  in her maternal grandfather; Other in her brother; Pancreatic cancer (age of onset: 73) in her cousin; Prostate cancer (age of onset: 48) in her brother; Throat cancer in her brother.  Social History:  She  reports that she quit smoking about 29 years ago. Her smoking use included cigarettes. She has a 15.00 pack-year smoking history. She has never used smokeless tobacco. She reports current alcohol use. She reports that she does not use drugs.

## 2019-08-05 MED ORDER — SODIUM CHLORIDE 3 % IN NEBU: INHALATION_SOLUTION | Freq: Two times a day (BID) | RESPIRATORY_TRACT | 12 refills | Status: DC | PRN

## 2019-08-05 NOTE — Addendum Note (Signed)
Addended by: Lia Foyer R on: 08/05/2019 12:21 PM   Modules accepted: Orders

## 2019-08-06 ENCOUNTER — Ambulatory Visit: Payer: Medicare Other | Admitting: Physical Therapy

## 2019-08-06 ENCOUNTER — Encounter: Payer: Self-pay | Admitting: Physical Therapy

## 2019-08-06 ENCOUNTER — Telehealth: Payer: Self-pay | Admitting: Rheumatology

## 2019-08-06 DIAGNOSIS — M6281 Muscle weakness (generalized): Secondary | ICD-10-CM

## 2019-08-06 DIAGNOSIS — M542 Cervicalgia: Secondary | ICD-10-CM

## 2019-08-06 DIAGNOSIS — G8929 Other chronic pain: Secondary | ICD-10-CM

## 2019-08-06 DIAGNOSIS — M545 Low back pain, unspecified: Secondary | ICD-10-CM

## 2019-08-06 DIAGNOSIS — R293 Abnormal posture: Secondary | ICD-10-CM

## 2019-08-06 NOTE — Telephone Encounter (Signed)
Patient called stating she has been attending physical therapy for her cervical spine and is requesting to add her lower back to the PT orders.  Patient states her therapists Janett Billow and Anderson Malta have given her a few exercises, but need orders from Dr. Estanislado Pandy in order to address her lower back problems.

## 2019-08-06 NOTE — Therapy (Signed)
[place], [place], [address] Phone: [phone]   Fax:  [phone]  Physical Therapy Treatment  Patient Details  Name: [name] MRN: [id number] Date of Birth: 04-06-1951 Referring Provider (PT): Dr. Patrecia Pour   Encounter Date: 08/06/2019    Physical Therapy Telehealth Visit:  I connected with Glendora Score today at 12:30 by Webex video conference and verified that I am speaking with the correct person using two identifiers.  I discussed the limitations, risks, security and privacy concerns of performing an evaluation and management service by Webex and the availability of in person appointments.  I also discussed with the patient that there may be a patient responsible charge related to this service. The patient expressed understanding and agreed to proceed.    The patient's address was confirmed.  Identified to the patient that therapist is a licensed PT in the state of [place].  Verified phone #  to call in case of technical difficulties.   PT End of Session - 08/06/19 1302    Visit Number  3    Number of Visits  16    Date for PT Re-Evaluation  09/16/19    Authorization Type  MCR   PN at visit 10    PT Start Time  1254    PT Stop Time  1325    PT Time Calculation (min)  31 min    Activity Tolerance  Patient tolerated treatment well    Behavior During Therapy  WFL for tasks assessed/performed       Past Medical History:  Diagnosis Date  . AICD (automatic cardioverter/defibrillator) present   . Anxiety   . Arthritis   . 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 Cerebral aneurysm, nonruptured    had a clip put in  . Clostridium difficile infection   . Depressive disorder, not elsewhere classified   . Diverticulosis of colon (without mention of hemorrhage)   . Esophageal reflux   . Fibromyalgia   . Gastritis   . GERD (gastroesophageal reflux disease)   . GI bleed  2004  . Hiatal hernia   . Hyperlipidemia   . Hypertension   . Hypothyroidism   . Internal hemorrhoids   . Obstructive sleep apnea (adult) (pediatric)   . Ostium secundum type atrial septal defect   . Other chronic nonalcoholic liver disease   . Other pulmonary embolism and infarction   . Paroxysmal ventricular tachycardia ([place])   . Personal history of radiation therapy   . Pneumonia    history  . Presence of permanent cardiac pacemaker   . PUD (peptic ulcer disease)   . Radiation 02/03/15-03/10/15   Right Breast  . Sarcoid    per pt , not sure  . Schatzki's ring   . Stroke [facility]) 2013   tia/ pt feels it was around 2008 0r 2009  . Takotsubo syndrome   . Tubular adenoma of colon   . Unspecified transient cerebral ischemia   . Unspecified vitamin D deficiency     Past Surgical History:  Procedure Laterality Date  . ABI  2006   normal  . BREAST EXCISIONAL BIOPSY    . BREAST LUMPECTOMY WITH RADIOACTIVE SEED LOCALIZATION Right 01/03/2015   Procedure: BREAST LUMPECTOMY WITH RADIOACTIVE SEED LOCALIZATION;  Surgeon: Autumn Messing III, MD;  Location: [facility];  Service: General;  Laterality: Right;  . CARDIAC DEFIBRILLATOR PLACEMENT  2006; 2012   BSX single chamber ICD  .  carotid dopplers  2006   neg  . CEREBRAL ANEURYSM REPAIR  02/1999  . COLONOSCOPY    . hospitalization  2004   GI bleed, PUD, diverticulosis (EGD,colonscopy)  . hospitalization     PE, NSVT, s/p defib  . KNEE ARTHROSCOPY     bilateral  . LEFT HEART CATHETERIZATION WITH CORONARY ANGIOGRAM N/A 02/22/2012   Procedure: LEFT HEART CATHETERIZATION WITH CORONARY ANGIOGRAM;  Surgeon: Burnell Blanks, MD;  Location: [facility] C[facility];  Service: Cardiovascular;  Laterality: N/A;  . PARTIAL HYSTERECTOMY     Fibroids  . TONGUE BIOPSY  12/12/2017   due to sore tongue and white patches/abnormal cells  . TOTAL KNEE ARTHROPLASTY Left 02/13/2016   Procedure: TOTAL KNEE ARTHROPLASTY;  Surgeon: Vickey Huger, MD;  Location: [facility];   Service: Orthopedics;  Laterality: Left;  . UPPER GASTROINTESTINAL ENDOSCOPY      There were no vitals filed for this visit.  Subjective Assessment - 08/06/19 1254    Subjective  Feeling better overall, less symptoms in hand.  My back is hurting more, worse.    Currently in Pain?  Yes    Pain Score  1     Pain Location  Neck    Pain Orientation  Left    Multiple Pain Sites  Yes    Pain Score  4    Pain Location  Back    Pain Orientation  Lower    Pain Descriptors / Indicators  Aching    Pain Type  Chronic pain    Pain Onset  More than a month ago    Pain Frequency  Intermittent         [facility] Adult PT Treatment/Exercise - 08/06/19 0001      Self-Care   Self-Care  Posture;Heat/Ice Application;Other Self-Care Comments    Posture  seated with lumbar roll    Heat/Ice Application  heat may help back and neck as well, has at home     Other Self-Care Comments   HEP standing, stabilization and consider lumbar referral       Neck Exercises: Stabilization   Stabilization  standing wall exercises: arms overhead, then out to the side,   cued for core and chin tuck      Shoulder Exercises: Standing   Horizontal ABduction  Strengthening;Both;10 reps    Horizontal ABduction Weight (lbs)  3    External Rotation  Strengthening;Both;10 reps    External Rotation Weight (lbs)  3    Other Standing Exercises  sink stretch for posterior stretch x 5 knees bending, also done in sitting at table       Neck Exercises: Stretches   Upper Trapezius Stretch  2 reps;30 seconds    Levator Stretch  2 reps;30 seconds    Other Neck Stretches  rotation SNAG   cues for technique    Other Neck Stretches  cervical rotation with hand on Lt upper trap; L stretch seated             PT Education - 08/06/19 1338    Education Details  stab, office posture, LBP    Person(s) Educated  Patient    Methods  Explanation;Handout;Demonstration    Comprehension  Verbalized understanding       PT Short Term  Goals - 07/22/19 1238      PT SHORT TERM GOAL #1   Title  Pt will be I with basic HEP    Time  4    Period  Weeks    Status  New    Target Date  08/19/19      PT SHORT TERM GOAL #2   Title  Pt will be able to get in and out of bed with less strain on neck, difficulty in UEs.    Time  4    Period  Weeks    Status  New    Target Date  08/19/19      PT SHORT TERM GOAL #3   Title  Pt will show corrected posture and report awareness in sitting and with functional activity    Time  4    Period  Weeks    Status  New    Target Date  08/19/19        PT Long Term Goals - 07/22/19 1239      PT LONG TERM GOAL #1   Title  Pt will be able to improve FOTO score to less than 40% impaired    Time  8    Period  Weeks    Status  New    Target Date  09/16/19      PT LONG TERM GOAL #2   Title  Pt will be able to rotate cervical spine to L 50 deg or more for improved driving and interaction with environment    Time  8    Period  Weeks    Status  New    Target Date  09/16/19      PT LONG TERM GOAL #3   Title  Pt will be able to reach, lift light items in her home without increased neck pain.    Time  8    Period  Weeks    Status  New    Target Date  09/16/19      PT LONG TERM GOAL #4   Title  Pt will be I with more advanced HEP for UE strength and cervical AROM as of last visit.    Time  8    Period  Weeks    Status  New    Target Date  09/16/19            Plan - 08/06/19 1328    Clinical Impression Statement  Session shortened due to late check in and technical difficulties. Neck pain is improved although she feel her low back pain is getting worse.  Limited in lateral flexion to Rt.  Rotation improved.  Discussed the fact that sitting more frequently may be contributing to her spine stiffness.  She may get a referral from Dr. Patrecia Pour in order for [place] to treat her lumbar as well.    PT Treatment/Interventions  ADLs/Self Care Home Management;Therapeutic  activities;Patient/family education;Taping;Therapeutic exercise;Moist Heat;Cryotherapy;Traction;Ultrasound;Functional mobility training;Neuromuscular re-education;Manual techniques;Dry needling;Passive range of motion    PT Next Visit Plan  lumbar? cont stabilization.  Use tubing in standing and check this new HEP.  Warm up with sit to stand, arm circles?    PT Home Exercise Plan  [id number]  chin tuck, scap squeeze, UT and LS stretch , corener stretch, rotation SNAG, GHJ flx with pillow case, L stretch seated, standing scap, neck stab    Consulted and Agree with Plan of Care  Patient       Patient will benefit from skilled therapeutic intervention in order to improve the following deficits and impairments:  Increased fascial restricitons, Impaired sensation, Pain, Postural dysfunction, Decreased mobility, Decreased range of motion, Decreased strength, Impaired UE functional use, Obesity, Impaired flexibility, Decreased balance  Visit Diagnosis: Cervicalgia  Muscle weakness (generalized)  Abnormal posture     Problem List Patient Active Problem List   Diagnosis Date Noted  . Pre-op exam 01/21/2018  . Neck pain 10/25/2017  . Paresthesias in left hand 10/25/2017  . Paresthesia of foot, bilateral 04/12/2017  . Primary osteoarthritis of both hands 09/27/2016  . Primary osteoarthritis of both knees 09/27/2016  . Urinary frequency 09/26/2016  . Screen for STD (sexually transmitted disease) 09/26/2016  . 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  . Nasopharyngitis, 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/2016  . Postnasal drip 05/18/2016  . S/P total knee replacement 02/13/2016  . Chronic pain of both knees 12/06/2015  . Globus pharyngeus 05/17/2015  . Productive cough 05/17/2015  . Genetic testing 12/23/2014  . Family history of breast cancer   . Family history of colon cancer   . Family history of pancreatic cancer   . Breast cancer of upper-outer quadrant of right female breast ([place]) 11/23/2014  . Allergy  to ACE inhibitors 09/27/2014  . Encounter for Medicare annual wellness exam 06/27/2014  . Prediabetes 06/23/2013  . Routine general medical examination at a health care facility 06/23/2013  . RLS (restless legs syndrome) 01/15/2013  . Hx of Clostridium difficile infection 10/10/2012  . Bronchiectasis with acute exacerbation ([place]) 04/29/2012  . Dyspnea on exertion 04/02/2012  . Non-ischemic cardiomyopathy ([place]) 03/07/2012  . Colon cancer screening 01/11/2012  . Post-menopausal 01/11/2012  . Chest pain 11/07/2011  . Palpitations 04/17/2011  . Abnormal tympanic membrane 01/22/2011  . Obstructive sleep apnea 04/04/2010  . VENTRICULAR TACHYCARDIA 01/03/2009  . ICD  [employer]  Single chamber 01/03/2009  . PATENT FORAMEN [place] 09/29/2008  . Vitamin D deficiency 08/17/2008  . Hypothyroidism 11/18/2006  . Hyperlipidemia 11/18/2006  . Depression with anxiety 11/18/2006  . Essential hypertension 11/18/2006  . MITRAL VALVE PROLAPSE 11/18/2006  . CEREBRAL ANEURYSM 11/18/2006  . Allergic rhinitis 11/18/2006  . GERD 11/18/2006  . DIVERTICULOSIS, COLON 11/18/2006  . Fatty liver 11/18/2006  . Fibromyalgia 11/18/2006    Bunny Lowdermilk 08/06/2019, 1:39 PM  [facility] [address] [place], [place], [address] Phone: [phone]   Fax:  [phone]  Name: CHRISTIANNE MCCARSON MRN: [id number] Date of Birth: 06-Jun-1951  Raeford Razor, PT 08/06/19 1:40 PM Phone: [phone] Fax: [phone]

## 2019-08-06 NOTE — Patient Instructions (Signed)
Access Code: [email or web address]  URL: [email or web address]  Date: 08/06/2019  Prepared by: Raeford Razor   Exercises  Standing Isometric Cervical Extension and Bilateral Shoulder Flexion with Ball at [place] 10 reps - 2 sets - 5 hold - 1x daily - 7x weekly  Standing Shoulder Horizontal Abduction with Resistance - 10 reps - 2 sets - 5 hold - 1x daily - 7x weekly  Patient Education  Low Back Pain Handout  Office Posture

## 2019-08-06 NOTE — Telephone Encounter (Signed)
Ok to place order for evaluation and treatment of lower back pain at PT.

## 2019-08-06 NOTE — Telephone Encounter (Signed)
Advised patient order has been placed, patient verbalized understanding.

## 2019-08-10 ENCOUNTER — Encounter: Payer: Medicare Other | Admitting: Physical Therapy

## 2019-08-11 ENCOUNTER — Encounter: Payer: Self-pay | Admitting: Physical Therapy

## 2019-08-11 ENCOUNTER — Other Ambulatory Visit: Payer: Self-pay

## 2019-08-11 ENCOUNTER — Ambulatory Visit: Payer: Medicare Other | Admitting: Physical Therapy

## 2019-08-11 DIAGNOSIS — R293 Abnormal posture: Secondary | ICD-10-CM

## 2019-08-11 DIAGNOSIS — M542 Cervicalgia: Secondary | ICD-10-CM

## 2019-08-11 DIAGNOSIS — G8929 Other chronic pain: Secondary | ICD-10-CM

## 2019-08-11 DIAGNOSIS — M6281 Muscle weakness (generalized): Secondary | ICD-10-CM

## 2019-08-11 NOTE — Patient Instructions (Signed)
Access Code: [email or web address]  URL: [email or web address]  Date: 08/11/2019  Prepared by: Raeford Razor   Exercises  Supine Lower Trunk Rotation - 10 reps - 2 sets - 30 hold - 2x daily - 7x weekly  Supine Posterior Pelvic Tilt - 10 reps - 1 sets - 10 hold - 2x daily - 7x weekly  Seated Pelvic Tilt - 10 reps - 1 sets - 10 hold - 2x daily - 7x weekly  Standing Hip Abduction - 10 reps - 2 sets - 30 hold - 2x daily - 7x weekly

## 2019-08-11 NOTE — Therapy (Signed)
[place], [place], [address] Phone: [phone]   Fax:  [phone]  Physical Therapy Evaluation  Patient Details  Name: [name] MRN: [id number] Date of Birth: May 24, 1951 Referring Provider (PT): Hazel Sams, PA-C   Physical Therapy Telehealth Visit:  I connected with [name] by [facility] video conference at 2:00 pm and verified that I am speaking with the correct person using two identifiers.  I discussed the limitations, risks, security and privacy concerns of performing an evaluation and management service by Webex and the availability of in person appointments.  I also discussed with the patient that there may be a patient responsible charge related to this service. The patient expressed understanding and agreed to proceed.    The patient's address was confirmed.  Identified to the patient that therapist is a licensed PT n the state of .  Verified phone number to call in case of technical difficulties.    Encounter Date: 08/11/2019  PT End of Session - 08/11/19 2004    Visit Number  4    Number of Visits  16    Date for PT Re-Evaluation  09/16/19    Authorization Type  MCR   PN at visit 10    PT Start Time  1400    PT Stop Time  1438    PT Time Calculation (min)  38 min       Past Medical History:  Diagnosis Date  . AICD (automatic cardioverter/defibrillator) present   . Anxiety   . Arthritis   . 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 Cerebral aneurysm, nonruptured    had a clip put in  . Clostridium difficile infection   . Depressive disorder, not elsewhere classified   . Diverticulosis of colon (without mention of hemorrhage)   . Esophageal reflux   . Fibromyalgia   . Gastritis   . GERD (gastroesophageal reflux disease)   . GI bleed 2004  . Hiatal hernia   . Hyperlipidemia   . Hypertension   . Hypothyroidism   . Internal hemorrhoids   .  Obstructive sleep apnea (adult) (pediatric)   . Ostium secundum type atrial septal defect   . Other chronic nonalcoholic liver disease   . Other pulmonary embolism and infarction   . Paroxysmal ventricular tachycardia ([place])   . Personal history of radiation therapy   . Pneumonia    history  . Presence of permanent cardiac pacemaker   . PUD (peptic ulcer disease)   . Radiation 02/03/15-03/10/15   Right Breast  . Sarcoid    per pt , not sure  . Schatzki's ring   . Stroke [facility]) 2013   tia/ pt feels it was around 2008 0r 2009  . Takotsubo syndrome   . Tubular adenoma of colon   . Unspecified transient cerebral ischemia   . Unspecified vitamin D deficiency     Past Surgical History:  Procedure Laterality Date  . ABI  2006   normal  . BREAST EXCISIONAL BIOPSY    . BREAST LUMPECTOMY WITH RADIOACTIVE SEED LOCALIZATION Right 01/03/2015   Procedure: BREAST LUMPECTOMY WITH RADIOACTIVE SEED LOCALIZATION;  Surgeon: Autumn Messing III, MD;  Location: [place];  Service: General;  Laterality: Right;  . CARDIAC DEFIBRILLATOR PLACEMENT  2006; 2012   BSX single chamber ICD  . carotid dopplers  2006   neg  . CEREBRAL ANEURYSM REPAIR  02/1999  . COLONOSCOPY    .  hospitalization  2004   GI bleed, PUD, diverticulosis (EGD,colonscopy)  . hospitalization     PE, NSVT, s/p defib  . KNEE ARTHROSCOPY     bilateral  . LEFT HEART CATHETERIZATION WITH CORONARY ANGIOGRAM N/A 02/22/2012   Procedure: LEFT HEART CATHETERIZATION WITH CORONARY ANGIOGRAM;  Surgeon: Burnell Blanks, MD;  Location: [facility] CATH LAB;  Service: Cardiovascular;  Laterality: N/A;  . PARTIAL HYSTERECTOMY     Fibroids  . TONGUE BIOPSY  12/12/2017   due to sore tongue and white patches/abnormal cells  . TOTAL KNEE ARTHROPLASTY Left 02/13/2016   Procedure: TOTAL KNEE ARTHROPLASTY;  Surgeon: Vickey Huger, MD;  Location: [place];  Service: Orthopedics;  Laterality: Left;  . UPPER GASTROINTESTINAL ENDOSCOPY      There were no vitals filed for  this visit.   Subjective Assessment - 08/11/19 1422    Subjective  Patient got referral for low back pain.  Her neck is great. Pt had alot of errands to do today, Did not receive HEP from last visit , did not call me to let me know.    Limitations  Lifting    Currently in Pain?  Yes    Pain [name]  3     Pain Onset  More than a month ago    Pain Frequency  Intermittent         [facility] PT Assessment - 08/11/19 0001      Assessment   Medical Diagnosis  low back pain     Referring Provider (PT)  Hazel Sams, PA-C    Onset Date/Surgical Date  --   chronic    Prior Therapy  Yes, a few yrs ago       Precautions   Precautions  None;ICD/Pacemaker      Restrictions   Weight Bearing Restrictions  No      Balance Screen   Has the patient fallen in the past 6 months  No      [employer] residence    Living Arrangements  Alone    Type of [place] to enter    Entrance Stairs-Number of Steps  [place]  One level    Additional Comments  has ramp       Prior Function   Level of Independence  Independent    Vocation  Retired    Leisure  work with [employer], stays home most of the time       Cognition   Overall Cognitive Status  Within Functional Limits for tasks assessed      Sensation   Light Touch  Appears Intact      Posture/Postural Control   Posture/Postural Control  Postural limitations    Postural Limitations  Rounded Shoulders;Forward head      AROM   Lumbar Flexion  WFL pain coming back to neutral     Lumbar Extension  75%    Lumbar - Right Rotation  WFL pain on R     Lumbar - Left Rotation  WFL pain R       Strength   Overall Strength Comments  unable to test via telehealth       Flexibility   Hamstrings  appear WFL , Rt hip tighter than L in ER/IR       Transfers   Five time sit to stand comments   19 sec  Objective measurements  completed on examination: See above findings.      [place] Adult PT Treatment/Exercise - 08/11/19 0001      Self-Care   Other Self-Care Comments   HEP       Lumbar Exercises: Stretches   Lower Trunk Rotation  10 seconds    Lower Trunk Rotation Limitations  x 10     Pelvic Tilt  10 reps    Pelvic Tilt Limitations  seated for core and lumbar release       Lumbar Exercises: Standing   Shoulder Extension  Strengthening;Both;15 reps             PT Education - 08/11/19 2003    Education Details  Neck exercises did not cause back pain, mobility and arthritis, symptom mgmt    Person(s) Educated  Patient    Methods  Explanation;Handout    Comprehension  Verbalized understanding;Returned demonstration;Need further instruction       PT Short Term Goals - 07/22/19 1238      PT SHORT TERM GOAL #1   Title  Pt will be I with basic HEP    Time  4    Period  Weeks    Status  New    Target Date  08/19/19      PT SHORT TERM GOAL #2   Title  Pt will be able to get in and out of bed with less strain on neck, difficulty in UEs.    Time  4    Period  Weeks    Status  New    Target Date  08/19/19      PT SHORT TERM GOAL #3   Title  Pt will show corrected posture and report awareness in sitting and with functional activity    Time  4    Period  Weeks    Status  New    Target Date  08/19/19        PT Long Term Goals - 08/11/19 2005      PT LONG TERM GOAL #1   Title  Pt will be able to improve FOTO [name] to less than 40% impaired    Status  Unable to assess      PT LONG TERM GOAL #2   Title  Pt will be able to rotate cervical spine to L 50 deg or more for improved driving and interaction with environment    Status  Unable to assess      PT LONG TERM GOAL #3   Title  Pt will be able to reach, lift light items in her home without increased  back , neck pain.    Status  Revised      PT LONG TERM GOAL #4   Title  Pt will be I with more advanced HEP for UE strength, trunk and  neck AROM as of last visit.    Period  Weeks    Status  Revised    Target Date  09/16/19      PT LONG TERM GOAL #5   Title  Pt will report 50% less stiffness in AM in low back    Time  4    Period  Weeks    Status  New    Target Date  09/16/19             Plan - 08/11/19 2020    Clinical Impression Statement  Patient seen for lumbar eval, low complexity. Will cont to see her for her neck  as needed, subjectively reports less neck pain but no goals checked.  She likely has had in increase of low back pain due to decrease in activity level. Her pattern of pain is consistent with DDD or facet arthropathy.  Will incorporate lumbar into POC and continue to see via telehealth per her request.    PT Treatment/Interventions  ADLs/Self Care Home Management;Therapeutic activities;Patient/family education;Taping;Therapeutic exercise;Moist Heat;Cryotherapy;Traction;Ultrasound;Functional mobility training;Neuromuscular re-education;Manual techniques;Dry needling;Passive range of motion    PT Next Visit Plan  lumbar? cont stabilization.  Use tubing in standing and check this new HEP.  Warm up with sit to stand, arm circles, standing hip.core.  Asked her to have a space for hooklying and prop phone for better visual perspective of body.    PT Home Exercise Plan  [id number]  chin tuck, scap squeeze, UT and LS stretch , corener stretch, rotation SNAG, GHJ flx with pillow case, L stretch seated, standing scap, neck stab, LTR, standing hip, PPT    Consulted and Agree with Plan of Care  Patient       Patient will benefit from skilled therapeutic intervention in order to improve the following deficits and impairments:  Increased fascial restricitons, Impaired sensation, Pain, Postural dysfunction, Decreased mobility, Decreased range of motion, Decreased strength, Impaired UE functional use, Obesity, Impaired flexibility, Decreased balance  Visit Diagnosis: Cervicalgia  Muscle weakness  (generalized)  Abnormal posture  Chronic bilateral low back pain, unspecified whether sciatica present     Problem List Patient Active Problem List   Diagnosis Date Noted  . Pre-op exam 01/21/2018  . Neck pain 10/25/2017  . Paresthesias in left hand 10/25/2017  . Paresthesia of foot, bilateral 04/12/2017  . Primary osteoarthritis of both hands 09/27/2016  . Primary osteoarthritis of both knees 09/27/2016  . Urinary frequency 09/26/2016  . Screen for STD (sexually transmitted disease) 09/26/2016  . 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  . Nasopharyngitis, 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/2016  . Postnasal drip 05/18/2016  . S/P total knee replacement 02/13/2016  . Chronic pain of both knees 12/06/2015  . Globus pharyngeus 05/17/2015  . Productive cough 05/17/2015  . Genetic testing 12/23/2014  . Family history of breast cancer   . Family history of colon cancer   . Family history of pancreatic cancer   . Breast cancer of upper-outer quadrant of right female breast ([place]) 11/23/2014  . Allergy to ACE inhibitors 09/27/2014  . Encounter for Medicare annual wellness exam 06/27/2014  . Prediabetes 06/23/2013  . Routine general medical examination at a health care facility 06/23/2013  . RLS (restless legs syndrome) 01/15/2013  . Hx of Clostridium difficile infection 10/10/2012  . Bronchiectasis with acute exacerbation ([place]) 04/29/2012  . Dyspnea on exertion 04/02/2012  . Non-ischemic cardiomyopathy ([place]) 03/07/2012  . Colon cancer screening 01/11/2012  . Post-menopausal 01/11/2012  . Chest pain 11/07/2011  . Palpitations 04/17/2011  . Abnormal tympanic membrane 01/22/2011  . Obstructive sleep apnea 04/04/2010  . VENTRICULAR TACHYCARDIA 01/03/2009  . ICD  [employer]  Single chamber 01/03/2009  . PATENT FORAMEN OVALE 09/29/2008  . Vitamin D deficiency 08/17/2008  . Hypothyroidism 11/18/2006  . Hyperlipidemia 11/18/2006  . Depression with anxiety 11/18/2006  . Essential hypertension  11/18/2006  . MITRAL VALVE PROLAPSE 11/18/2006  . CEREBRAL ANEURYSM 11/18/2006  . Allergic rhinitis 11/18/2006  . GERD 11/18/2006  . DIVERTICULOSIS, COLON 11/18/2006  . Fatty liver 11/18/2006  . Fibromyalgia 11/18/2006    [name] 08/11/2019, 8:26 PM  [facility] [address] [place], [place], [address]  Phone: [phone]   Fax:  [phone]  Name: [name] MRN: [id number] Date of Birth: 07-Feb-1951   Raeford Razor, PT 08/11/19 8:26 PM Phone: [phone] Fax: [phone]

## 2019-08-12 ENCOUNTER — Encounter: Payer: Medicare Other | Admitting: Physical Therapy

## 2019-08-13 ENCOUNTER — Ambulatory Visit: Payer: Medicare Other | Admitting: Physical Therapy

## 2019-08-13 ENCOUNTER — Encounter: Payer: Self-pay | Admitting: Physical Therapy

## 2019-08-13 DIAGNOSIS — M6281 Muscle weakness (generalized): Secondary | ICD-10-CM

## 2019-08-13 DIAGNOSIS — M542 Cervicalgia: Secondary | ICD-10-CM

## 2019-08-13 DIAGNOSIS — G8929 Other chronic pain: Secondary | ICD-10-CM

## 2019-08-13 DIAGNOSIS — M545 Low back pain, unspecified: Secondary | ICD-10-CM

## 2019-08-13 DIAGNOSIS — R293 Abnormal posture: Secondary | ICD-10-CM

## 2019-08-13 NOTE — Therapy (Signed)
[place], [place], [address] Phone: [phone]   Fax:  [phone]  Physical Therapy Treatment  Patient Details  Name: [name] MRN: [id number] Date of Birth: 1951/01/31 Referring Provider (PT): Hazel Sams, PA-C   Encounter Date: 08/13/2019  PT End of Session - 08/13/19 1024    Visit Number  5    Number of Visits  16    Date for PT Re-Evaluation  09/16/19    Authorization Type  MCR   PN at visit 10    PT Start Time  1016    PT Stop Time  1054    PT Time Calculation (min)  38 min    Activity Tolerance  Patient tolerated treatment well    Behavior During Therapy  [facility] for tasks assessed/performed       Past Medical History:  Diagnosis Date  . AICD (automatic cardioverter/defibrillator) present   . Anxiety   . Arthritis   . 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 Cerebral aneurysm, nonruptured    had a clip put in  . Clostridium difficile infection   . Depressive disorder, not elsewhere classified   . Diverticulosis of colon (without mention of hemorrhage)   . Esophageal reflux   . Fibromyalgia   . Gastritis   . GERD (gastroesophageal reflux disease)   . GI bleed 2004  . Hiatal hernia   . Hyperlipidemia   . Hypertension   . Hypothyroidism   . Internal hemorrhoids   . Obstructive sleep apnea (adult) (pediatric)   . Ostium secundum type atrial septal defect   . Other chronic nonalcoholic liver disease   . Other pulmonary embolism and infarction   . Paroxysmal ventricular tachycardia ([place])   . Personal history of radiation therapy   . Pneumonia    history  . Presence of permanent cardiac pacemaker   . PUD (peptic ulcer disease)   . Radiation 02/03/15-03/10/15   Right Breast  . Sarcoid    per pt , not sure  . Schatzki's ring   . Stroke [facility]) 2013   tia/ pt feels it was around 2008 0r 2009  . Takotsubo syndrome   . Tubular adenoma of colon   .  Unspecified transient cerebral ischemia   . Unspecified vitamin D deficiency     Past Surgical History:  Procedure Laterality Date  . ABI  2006   normal  . BREAST EXCISIONAL BIOPSY    . BREAST LUMPECTOMY WITH RADIOACTIVE SEED LOCALIZATION Right 01/03/2015   Procedure: BREAST LUMPECTOMY WITH RADIOACTIVE SEED LOCALIZATION;  Surgeon: Autumn Messing III, MD;  Location: [place];  Service: General;  Laterality: Right;  . CARDIAC DEFIBRILLATOR PLACEMENT  2006; 2012   BSX single chamber ICD  . carotid dopplers  2006   neg  . CEREBRAL ANEURYSM REPAIR  02/1999  . COLONOSCOPY    . hospitalization  2004   GI bleed, PUD, diverticulosis (EGD,colonscopy)  . hospitalization     PE, NSVT, s/p defib  . KNEE ARTHROSCOPY     bilateral  . LEFT HEART CATHETERIZATION WITH CORONARY ANGIOGRAM N/A 02/22/2012   Procedure: LEFT HEART CATHETERIZATION WITH CORONARY ANGIOGRAM;  Surgeon: Burnell Blanks, MD;  Location: [facility] CATH LAB;  Service: Cardiovascular;  Laterality: N/A;  . PARTIAL HYSTERECTOMY     Fibroids  . TONGUE BIOPSY  12/12/2017   due to sore tongue and white patches/abnormal cells  . TOTAL KNEE ARTHROPLASTY Left 02/13/2016  Procedure: TOTAL KNEE ARTHROPLASTY;  Surgeon: Vickey Huger, MD;  Location: [place];  Service: Orthopedics;  Laterality: Left;  . UPPER GASTROINTESTINAL ENDOSCOPY      There were no vitals filed for this visit.  Subjective Assessment - 08/13/19 1018    Subjective  neck is feeling better. Lt upper trap still sore when turning while driving. Just above my buttock oon Rt side.                       [place] Adult PT Treatment/Exercise - 08/13/19 0001      Exercises   Exercises  Other Exercises    Other Exercises   see scanned pt instructions for exercises reviewed today.                PT Short Term Goals - 07/22/19 1238      PT SHORT TERM GOAL #1   Title  Pt will be I with basic HEP    Time  4    Period  Weeks    Status  New    Target Date  08/19/19       PT SHORT TERM GOAL #2   Title  Pt will be able to get in and out of bed with less strain on neck, difficulty in UEs.    Time  4    Period  Weeks    Status  New    Target Date  08/19/19      PT SHORT TERM GOAL #3   Title  Pt will show corrected posture and report awareness in sitting and with functional activity    Time  4    Period  Weeks    Status  New    Target Date  08/19/19        PT Long Term Goals - 08/11/19 2005      PT LONG TERM GOAL #1   Title  Pt will be able to improve FOTO score to less than 40% impaired    Status  Unable to assess      PT LONG TERM GOAL #2   Title  Pt will be able to rotate cervical spine to L 50 deg or more for improved driving and interaction with environment    Status  Unable to assess      PT LONG TERM GOAL #3   Title  Pt will be able to reach, lift light items in her home without increased  back , neck pain.    Status  Revised      PT LONG TERM GOAL #4   Title  Pt will be I with more advanced HEP for UE strength, trunk and neck AROM as of last visit.    Period  Weeks    Status  Revised    Target Date  09/16/19      PT LONG TERM GOAL #5   Title  Pt will report 50% less stiffness in AM in low back    Time  4    Period  Weeks    Status  New    Target Date  09/16/19            Plan - 08/13/19 1031    Clinical Impression Statement  Pt reports neck is doing much better but is still limited by tightness in upper trap on Lt. She says she would like to come in for manual therapy but wants a private room for concerns of COVID 19. Added stretches  to HEP for low back which helped per pt- am going to print and mail her exercises as she does not like the app or email.    PT Treatment/Interventions  ADLs/Self Care Home Management;Therapeutic activities;Patient/family education;Taping;Therapeutic exercise;Moist Heat;Cryotherapy;Traction;Ultrasound;Functional mobility training;Neuromuscular re-education;Manual techniques;Dry  needling;Passive range of motion    PT Next Visit Plan  review lower body stretches, sit>stand with glut sets    PT Home Exercise Plan  [id number]  chin tuck, scap squeeze, UT and LS stretch , corener stretch, rotation SNAG, GHJ flx with pillow case, L stretch seated, standing scap, neck stab, LTR, standing hip, PPT; seated HSS, piriformis stretch    Consulted and Agree with Plan of Care  Patient       Patient will benefit from skilled therapeutic intervention in order to improve the following deficits and impairments:  Increased fascial restricitons, Impaired sensation, Pain, Postural dysfunction, Decreased mobility, Decreased range of motion, Decreased strength, Impaired UE functional use, Obesity, Impaired flexibility, Decreased balance  Visit Diagnosis: Cervicalgia  Muscle weakness (generalized)  Abnormal posture  Chronic bilateral low back pain, unspecified whether sciatica present     Problem List Patient Active Problem List   Diagnosis Date Noted  . Pre-op exam 01/21/2018  . Neck pain 10/25/2017  . Paresthesias in left hand 10/25/2017  . Paresthesia of foot, bilateral 04/12/2017  . Primary osteoarthritis of both hands 09/27/2016  . Primary osteoarthritis of both knees 09/27/2016  . Urinary frequency 09/26/2016  . Screen for STD (sexually transmitted disease) 09/26/2016  . 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  . Nasopharyngitis, 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/2016  . Postnasal drip 05/18/2016  . S/P total knee replacement 02/13/2016  . Chronic pain of both knees 12/06/2015  . Globus pharyngeus 05/17/2015  . Productive cough 05/17/2015  . Genetic testing 12/23/2014  . Family history of breast cancer   . Family history of colon cancer   . Family history of pancreatic cancer   . Breast cancer of upper-outer quadrant of right female breast ([place]) 11/23/2014  . Allergy to ACE inhibitors 09/27/2014  . Encounter for Medicare annual wellness exam 06/27/2014  . Prediabetes 06/23/2013  .  Routine general medical examination at a health care facility 06/23/2013  . RLS (restless legs syndrome) 01/15/2013  . Hx of Clostridium difficile infection 10/10/2012  . Bronchiectasis with acute exacerbation ([place]) 04/29/2012  . Dyspnea on exertion 04/02/2012  . Non-ischemic cardiomyopathy ([place]) 03/07/2012  . Colon cancer screening 01/11/2012  . Post-menopausal 01/11/2012  . Chest pain 11/07/2011  . Palpitations 04/17/2011  . Abnormal tympanic membrane 01/22/2011  . Obstructive sleep apnea 04/04/2010  . VENTRICULAR TACHYCARDIA 01/03/2009  . ICD  [employer]  Single chamber 01/03/2009  . PATENT FORAMEN OVALE 09/29/2008  . Vitamin D deficiency 08/17/2008  . Hypothyroidism 11/18/2006  . Hyperlipidemia 11/18/2006  . Depression with anxiety 11/18/2006  . Essential hypertension 11/18/2006  . MITRAL VALVE PROLAPSE 11/18/2006  . CEREBRAL ANEURYSM 11/18/2006  . Allergic rhinitis 11/18/2006  . GERD 11/18/2006  . DIVERTICULOSIS, COLON 11/18/2006  . Fatty liver 11/18/2006  . Fibromyalgia 11/18/2006    [place]ie [place] C. Iveth Heidemann PT, DPT 08/13/19 12:11 PM   [place], [place], [address] Phone: [phone]   Fax:  [phone]  Name: [name] MRN: [id number] Date of Birth: 1951/04/10

## 2019-08-17 ENCOUNTER — Ambulatory Visit: Payer: Medicare Other | Admitting: Physical Therapy

## 2019-08-17 ENCOUNTER — Encounter: Payer: Self-pay | Admitting: Physical Therapy

## 2019-08-17 DIAGNOSIS — R293 Abnormal posture: Secondary | ICD-10-CM

## 2019-08-17 DIAGNOSIS — G8929 Other chronic pain: Secondary | ICD-10-CM

## 2019-08-17 DIAGNOSIS — M542 Cervicalgia: Secondary | ICD-10-CM

## 2019-08-17 DIAGNOSIS — M6281 Muscle weakness (generalized): Secondary | ICD-10-CM

## 2019-08-17 DIAGNOSIS — M545 Low back pain, unspecified: Secondary | ICD-10-CM

## 2019-08-17 NOTE — Therapy (Signed)
[name], [place], [address] Phone: [phone]   Fax:  [phone]  Physical Therapy Treatment  Patient Details  Name: [name] MRN: [id number] Date of Birth: 11-17-1950 Referring Provider (PT): Hazel Sams, PA-C     Physical Therapy Telehealth Visit:  I connected with [name] today at 10:45 am by [facility] video conference and verified that I am speaking with the correct person using two identifiers.  I discussed the limitations, risks, security and privacy concerns of performing an evaluation and management service by Webex and the availability of in person appointments.  I also discussed with the patient that there may be a patient responsible charge related to this service. The patient expressed understanding and agreed to proceed.    The patient's address was confirmed.  Identified to the patient that therapist is a licensed PT  in the state of [place].  Verified phone #  And address to call in case of technical difficulties.   Encounter Date: 08/17/2019  PT End of Session - 08/17/19 1100    Visit Number  6    Number of Visits  16    Date for PT Re-Evaluation  09/16/19    Authorization Type  MCR   PN at visit 10    PT Start Time  1050    PT Stop Time  1124    PT Time Calculation (min)  34 min    Activity Tolerance  Patient tolerated treatment well    Behavior During Therapy  WFL for tasks assessed/performed       Past Medical History:  Diagnosis Date  . AICD (automatic cardioverter/defibrillator) present   . Anxiety   . Arthritis   . 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 Cerebral aneurysm, nonruptured    had a clip put in  . Clostridium difficile infection   . Depressive disorder, not elsewhere classified   . Diverticulosis of colon (without mention of hemorrhage)   . Esophageal reflux   . Fibromyalgia   . Gastritis   . GERD (gastroesophageal reflux  disease)   . GI bleed 2004  . Hiatal hernia   . Hyperlipidemia   . Hypertension   . Hypothyroidism   . Internal hemorrhoids   . Obstructive sleep apnea (adult) (pediatric)   . Ostium secundum type atrial septal defect   . Other chronic nonalcoholic liver disease   . Other pulmonary embolism and infarction   . Paroxysmal ventricular tachycardia ([place])   . Personal history of radiation therapy   . Pneumonia    history  . Presence of permanent cardiac pacemaker   . PUD (peptic ulcer disease)   . Radiation 02/03/15-03/10/15   Right Breast  . Sarcoid    per pt , not sure  . Schatzki's ring   . Stroke [facility]) 2013   tia/ pt feels it was around 2008 0r 2009  . Takotsubo syndrome   . Tubular adenoma of colon   . Unspecified transient cerebral ischemia   . Unspecified vitamin D deficiency     Past Surgical History:  Procedure Laterality Date  . ABI  2006   normal  . BREAST EXCISIONAL BIOPSY    . BREAST LUMPECTOMY WITH RADIOACTIVE SEED LOCALIZATION Right 01/03/2015   Procedure: BREAST LUMPECTOMY WITH RADIOACTIVE SEED LOCALIZATION;  Surgeon: Autumn Messing III, MD;  Location: [place];  Service: General;  Laterality: Right;  . CARDIAC DEFIBRILLATOR PLACEMENT  2006; 2012  BSX single chamber ICD  . carotid dopplers  2006   neg  . CEREBRAL ANEURYSM REPAIR  02/1999  . COLONOSCOPY    . hospitalization  2004   GI bleed, PUD, diverticulosis (EGD,colonscopy)  . hospitalization     PE, NSVT, s/p defib  . KNEE ARTHROSCOPY     bilateral  . LEFT HEART CATHETERIZATION WITH CORONARY ANGIOGRAM N/A 02/22/2012   Procedure: LEFT HEART CATHETERIZATION WITH CORONARY ANGIOGRAM;  Surgeon: Burnell Blanks, MD;  Location: [facility] CATH LAB;  Service: Cardiovascular;  Laterality: N/A;  . PARTIAL HYSTERECTOMY     Fibroids  . TONGUE BIOPSY  12/12/2017   due to sore tongue and white patches/abnormal cells  . TOTAL KNEE ARTHROPLASTY Left 02/13/2016   Procedure: TOTAL KNEE ARTHROPLASTY;  Surgeon: Vickey Huger, MD;   Location: [place];  Service: Orthopedics;  Laterality: Left;  . UPPER GASTROINTESTINAL ENDOSCOPY      There were no vitals filed for this visit.  Subjective Assessment - 08/17/19 1051    Subjective  My back is killing me.  5/10.  I got the lower back exercises but I didnt have any relief.  Had to use the massager last night.  Neck pain is 3/10    Currently in Pain?  Yes    Pain Score  5     Pain Location  Back    Pain Orientation  Lower;Right    Pain Descriptors / Indicators  Aching;Tightness    Pain Type  Chronic pain    Pain Onset  More than a month ago    Pain Frequency  Intermittent    Aggravating Factors   standing, activity    Multiple Pain Sites  Yes    Pain Score  3    Pain Location  Neck    Pain Orientation  Left    Pain Descriptors / Indicators  Tightness    Pain Type  Chronic pain    Pain Radiating Towards  L UE    Pain Frequency  Intermittent    Aggravating Factors   turning head    Pain Relieving Factors  not sure  heat         [facility] Adult PT Treatment/Exercise - 08/17/19 0001      Lumbar Exercises: Stretches   Active Hamstring Stretch  Right;Left;3 reps;30 seconds    Piriformis Stretch  2 reps;30 seconds    Piriformis Stretch Limitations  seated figure 4     Other Lumbar Stretch Exercise  seated trunk rotation x 5, lateral bending x 5 each side     Other Lumbar Stretch Exercise  seated trunk flexion x 5 encouraged breathing       Lumbar Exercises: Standing   Other Standing Lumbar Exercises  flat back fold over (in lieu of standing extension ) x 3     Other Standing Lumbar Exercises  standing lateral stretch at countertop    extension increased back pain      Knee/Hip Exercises: Standing   Hip Abduction  Stengthening;Both;1 set;20 reps;Knee straight    Hip Extension  Stengthening;Both;1 set;20 reps;Knee straight             PT Education - 08/17/19 1053    Education Details  symptoms mgmt. seated AROM of trunk, HEP in the mail, limitations of  telehealth    Person(s) Educated  Patient    Methods  Explanation    Comprehension  Verbalized understanding       PT Short Term Goals - 08/17/19 1101  PT SHORT TERM GOAL #1   Title  Pt will be I with basic HEP    Baseline  needs cues    Status  On-going      PT SHORT TERM GOAL #2   Title  Pt will be able to get in and out of bed with less strain on neck, difficulty in UEs.    Baseline  puts alot of pressure on her neck    Status  On-going      PT SHORT TERM GOAL #3   Title  Pt will show corrected posture and report awareness in sitting and with functional activity    Status  On-going        PT Long Term Goals - 08/11/19 2005      PT LONG TERM GOAL #1   Title  Pt will be able to improve FOTO score to less than 40% impaired    Status  Unable to assess      PT LONG TERM GOAL #2   Title  Pt will be able to rotate cervical spine to L 50 deg or more for improved driving and interaction with environment    Status  Unable to assess      PT LONG TERM GOAL #3   Title  Pt will be able to reach, lift light items in her home without increased  back , neck pain.    Status  Revised      PT LONG TERM GOAL #4   Title  Pt will be I with more advanced HEP for UE strength, trunk and neck AROM as of last visit.    Period  Weeks    Status  Revised    Target Date  09/16/19      PT LONG TERM GOAL #5   Title  Pt will report 50% less stiffness in AM in low back    Time  4    Period  Weeks    Status  New    Target Date  09/16/19            Plan - 08/17/19 1125    Clinical Impression Statement  Pt reports neck was doing better but now hasnoticed increased pain.  Showed her how to get in and out of bed to reduce neck and back strain.  She was receptive.  She was able to do her HEP in standing and sitting with back pain increased to 7/10 with standing extension.  She may consider turning a visit into an in person visit but hesitant. No major improvement in function at this time.     PT Treatment/Interventions  ADLs/Self Care Home Management;Therapeutic activities;Patient/family education;Taping;Therapeutic exercise;Moist Heat;Cryotherapy;Traction;Ultrasound;Functional mobility training;Neuromuscular re-education;Manual techniques;Dry needling;Passive range of motion    PT Next Visit Plan  if in person needs manual and heel lift for Rt LE.    PT Home Exercise Plan  [email or web address]  chin tuck, scap squeeze, UT and LS stretch , corener stretch, rotation SNAG, GHJ flx with pillow case, L stretch seated, standing scap, neck stab, LTR, standing hip, PPT; seated HSS, piriformis stretch    Consulted and Agree with Plan of Care  Patient       Patient will benefit from skilled therapeutic intervention in order to improve the following deficits and impairments:  Increased fascial restricitons, Impaired sensation, Pain, Postural dysfunction, Decreased mobility, Decreased range of motion, Decreased strength, Impaired UE functional use, Obesity, Impaired flexibility, Decreased balance  Visit Diagnosis: Cervicalgia  Muscle weakness (generalized)  Abnormal posture  Chronic bilateral low back pain, unspecified whether sciatica present     Problem List Patient Active Problem List   Diagnosis Date Noted  . Pre-op exam 01/21/2018  . Neck pain 10/25/2017  . Paresthesias in left hand 10/25/2017  . Paresthesia of foot, bilateral 04/12/2017  . Primary osteoarthritis of both hands 09/27/2016  . Primary osteoarthritis of both knees 09/27/2016  . Urinary frequency 09/26/2016  . Screen for STD (sexually transmitted disease) 09/26/2016  . 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  . Nasopharyngitis, 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/2016  . Postnasal drip 05/18/2016  . S/P total knee replacement 02/13/2016  . Chronic pain of both knees 12/06/2015  . Globus pharyngeus 05/17/2015  . Productive cough 05/17/2015  . Genetic testing 12/23/2014  . Family history of breast cancer   . Family history of colon cancer    . Family history of pancreatic cancer   . Breast cancer of upper-outer quadrant of right female breast ([place]) 11/23/2014  . Allergy to ACE inhibitors 09/27/2014  . Encounter for Medicare annual wellness exam 06/27/2014  . Prediabetes 06/23/2013  . Routine general medical examination at a health care facility 06/23/2013  . RLS (restless legs syndrome) 01/15/2013  . Hx of Clostridium difficile infection 10/10/2012  . Bronchiectasis with acute exacerbation ([place]) 04/29/2012  . Dyspnea on exertion 04/02/2012  . Non-ischemic cardiomyopathy ([place]) 03/07/2012  . Colon cancer screening 01/11/2012  . Post-menopausal 01/11/2012  . Chest pain 11/07/2011  . Palpitations 04/17/2011  . Abnormal tympanic membrane 01/22/2011  . Obstructive sleep apnea 04/04/2010  . VENTRICULAR TACHYCARDIA 01/03/2009  . ICD  [employer]  Single chamber 01/03/2009  . PATENT FORAMEN OVALE 09/29/2008  . Vitamin D deficiency 08/17/2008  . Hypothyroidism 11/18/2006  . Hyperlipidemia 11/18/2006  . Depression with anxiety 11/18/2006  . Essential hypertension 11/18/2006  . MITRAL VALVE PROLAPSE 11/18/2006  . CEREBRAL ANEURYSM 11/18/2006  . Allergic rhinitis 11/18/2006  . GERD 11/18/2006  . DIVERTICULOSIS, COLON 11/18/2006  . Fatty liver 11/18/2006  . Fibromyalgia 11/18/2006    [name] 08/17/2019, 3:27 PM  [facility] [address] [place], [place], [address] Phone: [phone]   Fax:  [phone]  Name: LAURE LINDEMUTH MRN: [id number] Date of Birth: 12/04/1950  Raeford Razor, PT 08/17/19 3:28 PM Phone: [phone] Fax: [phone]

## 2019-08-19 ENCOUNTER — Ambulatory Visit: Payer: Medicare Other | Admitting: Physical Therapy

## 2019-08-20 ENCOUNTER — Telehealth: Payer: Self-pay | Admitting: Rheumatology

## 2019-08-20 MED ORDER — TRAMADOL HCL 50 MG PO TABS: 50.0000 mg | ORAL_TABLET | Freq: Three times a day (TID) | ORAL | 0 refills | Status: DC | PRN

## 2019-08-20 NOTE — Telephone Encounter (Signed)
Last Visit: 06/15/2019 Next Visit: 12/08/2019 UDS:06/09/2019 c/w Narc Agreement: 06/09/2019   Last fill: 06/18/2019 (21 tablets)   Okay to refill tramadol?

## 2019-08-20 NOTE — Telephone Encounter (Signed)
Please find out why patient needs tramadol .

## 2019-08-20 NOTE — Telephone Encounter (Signed)
Patient is requesting the tramadol for the neck pain she is experiencing. Patient states the tramadol does help the pain and "takes the edge off of fibromyalgia pain."   Patient states she has not been referred to pain management, I offered the referral and patient would like to wait until she sees neuro in 1 month.

## 2019-08-20 NOTE — Telephone Encounter (Signed)
Patient called requesting prescription refill of Tramadol to be sent to [facility] at [address].  Patient states she is experiencing "a lot of pain in her neck."    Patient states she is also trying to schedule an appointment with pain management.

## 2019-08-24 ENCOUNTER — Encounter: Payer: Self-pay | Admitting: Physical Therapy

## 2019-08-24 ENCOUNTER — Ambulatory Visit: Payer: Medicare Other | Attending: Rheumatology | Admitting: Physical Therapy

## 2019-08-24 DIAGNOSIS — M542 Cervicalgia: Secondary | ICD-10-CM | POA: Insufficient documentation

## 2019-08-24 DIAGNOSIS — R293 Abnormal posture: Secondary | ICD-10-CM | POA: Insufficient documentation

## 2019-08-24 DIAGNOSIS — M6281 Muscle weakness (generalized): Secondary | ICD-10-CM | POA: Diagnosis present

## 2019-08-24 DIAGNOSIS — G8929 Other chronic pain: Secondary | ICD-10-CM

## 2019-08-24 DIAGNOSIS — M545 Low back pain: Secondary | ICD-10-CM | POA: Insufficient documentation

## 2019-08-24 NOTE — Patient Instructions (Signed)
Access Code: [email or web address]  URL: [email or web address]  Date: 08/24/2019  Prepared by: Raeford Razor   Exercises  Seated Scapular Retraction - 10 reps - 1 sets - 5 hold - 2x daily - 7x weekly  Supine Chin Tuck - 10 reps - 1 sets - 5 hold - 2x daily - 7x weekly  Seated Gentle Upper Trapezius Stretch - 3 reps - 1 sets - 30 hold - 2x daily - 7x weekly  Gentle Levator Scapulae Stretch - 3 reps - 1 sets - 30 hold - 2x daily - 7x weekly  Corner Pec Major Stretch - 3 reps - 1 sets - 30 hold - 2x daily - 7x weekly  Seated Assisted Cervical Rotation with Towel - 3 reps - 1 sets - 5s hold - 1x daily - 7x weekly  Seated Shoulder Flexion AAROM with Dowel - 10 reps - 3 sets - 1x daily - 7x weekly  Standing 'L' Stretch at [employer] - 3 reps - 1 sets - 3 deep breahts hold - 2x daily - 7x weekly  Seated Flexion Stretch with Swiss Ball - 10 reps - 1 sets - 30 hold - 1x daily - 7x weekly  Standing Hip Extension - 10 reps - 2 sets - 5 hold - 1x daily - 7x weekly  Standing Hip Abduction - 10 reps - 2 sets - 5 hold - 1x daily - 7x weekly  Sit to Stand - 10 reps - 2 sets - 1x daily - 7x weekly  Seated Piriformis Stretch - 3 reps - 1 sets - 30 hold - 1x daily - 7x weekly

## 2019-08-24 NOTE — Therapy (Signed)
[place], [place], [address] Phone: [phone]   Fax:  [phone]  Physical Therapy Treatment  Patient Details  Name: [name] MRN: [id number] Date of Birth: 1950-11-14 Referring Provider (PT): Hazel Sams, PA-C   Physical Therapy Telehealth Visit:  I connected with [name] today at 1:12 PM by secure, live face-to-face video conference and verified that I am speaking with the correct person using two identifiers. I discussed the limitations, risks, security and privacy concerns of performing a video visit. I also discussed with the patient or legal guardian that there may be a patient responsible charge related to this service. The patient or legal guardian expressed understanding and verbal consent was obtained by Glendora Score  The patient's address was confirmed.  Identified to the patient that therapist is a licensed PT in the state of [place].  Verified phone # as [phone] to call in case of technical difficulties.    Encounter Date: 08/24/2019  PT End of Session - 08/24/19 1305    Visit Number  7    Number of Visits  16    Date for PT Re-Evaluation  09/16/19    Authorization Type  MCR   PN at visit 10    PT Start Time  1222    PT Stop Time  1303    PT Time Calculation (min)  41 min    Activity Tolerance  Patient tolerated treatment well    Behavior During Therapy  [facility] for tasks assessed/performed       Past Medical History:  Diagnosis Date  . AICD (automatic cardioverter/defibrillator) present   . Anxiety   . Arthritis   . 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 Cerebral aneurysm, nonruptured    had a clip put in  . Clostridium difficile infection   . Depressive disorder, not elsewhere classified   . Diverticulosis of colon (without mention of hemorrhage)   . Esophageal reflux   . Fibromyalgia   . Gastritis   . GERD (gastroesophageal reflux disease)   .  GI bleed 2004  . Hiatal hernia   . Hyperlipidemia   . Hypertension   . Hypothyroidism   . Internal hemorrhoids   . Obstructive sleep apnea (adult) (pediatric)   . Ostium secundum type atrial septal defect   . Other chronic nonalcoholic liver disease   . Other pulmonary embolism and infarction   . Paroxysmal ventricular tachycardia ([place])   . Personal history of radiation therapy   . Pneumonia    history  . Presence of permanent cardiac pacemaker   . PUD (peptic ulcer disease)   . Radiation 02/03/15-03/10/15   Right Breast  . Sarcoid    per pt , not sure  . Schatzki's ring   . Stroke [facility]) 2013   tia/ pt feels it was around 2008 0r 2009  . Takotsubo syndrome   . Tubular adenoma of colon   . Unspecified transient cerebral ischemia   . Unspecified vitamin D deficiency     Past Surgical History:  Procedure Laterality Date  . ABI  2006   normal  . BREAST EXCISIONAL BIOPSY    . BREAST LUMPECTOMY WITH RADIOACTIVE SEED LOCALIZATION Right 01/03/2015   Procedure: BREAST LUMPECTOMY WITH RADIOACTIVE SEED LOCALIZATION;  Surgeon: Autumn Messing III, MD;  Location: [place];  Service: General;  Laterality: Right;  . CARDIAC DEFIBRILLATOR PLACEMENT  2006; 2012   BSX single chamber ICD  .  carotid dopplers  2006   neg  . CEREBRAL ANEURYSM REPAIR  02/1999  . COLONOSCOPY    . hospitalization  2004   GI bleed, PUD, diverticulosis (EGD,colonscopy)  . hospitalization     PE, NSVT, s/p defib  . KNEE ARTHROSCOPY     bilateral  . LEFT HEART CATHETERIZATION WITH CORONARY ANGIOGRAM N/A 02/22/2012   Procedure: LEFT HEART CATHETERIZATION WITH CORONARY ANGIOGRAM;  Surgeon: Burnell Blanks, MD;  Location: [facility] CATH LAB;  Service: Cardiovascular;  Laterality: N/A;  . PARTIAL HYSTERECTOMY     Fibroids  . TONGUE BIOPSY  12/12/2017   due to sore tongue and white patches/abnormal cells  . TOTAL KNEE ARTHROPLASTY Left 02/13/2016   Procedure: TOTAL KNEE ARTHROPLASTY;  Surgeon: Vickey Huger, MD;  Location: [employer];   Service: Orthopedics;  Laterality: Left;  . UPPER GASTROINTESTINAL ENDOSCOPY      There were no vitals filed for this visit.  Subjective Assessment - 08/24/19 1303    Subjective  Stretches are helping.  I think I need a new pillow.    Currently in Pain?  Yes    Pain Score  3     Pain Location  Back    Pain Orientation  Lower;Right;Left    Pain Descriptors / Indicators  Aching    Pain Type  Chronic pain    Pain Onset  More than a month ago    Pain Frequency  Intermittent    Aggravating Factors   standing    Pain Relieving Factors  med,s heat    Multiple Pain Sites  Yes    Pain Location  Neck    Pain Orientation  Lower    Pain Descriptors / Indicators  Tightness    Pain Type  Chronic pain    Pain Onset  More than a month ago    Pain Frequency  Intermittent    Aggravating Factors   turn head    Pain Relieving Factors  heat            [facility] Adult PT Treatment/Exercise - 08/24/19 0001      Self-Care   Posture  stabilization, seasted posture    Other Self-Care Comments   Medbridge, HEP       Lumbar Exercises: Standing   Functional Squats  10 reps    Functional Squats Limitations  3 lbs    Forward Lunge Limitations  static lunge    x 10 each leg    Other Standing Lumbar Exercises  5 lbs shoulder press x 10       Knee/Hip Exercises: Standing   Hip Abduction  Stengthening;Both;1 set;20 reps;Knee straight    Hip Extension  Stengthening;Both;1 set;20 reps;Knee straight      Shoulder Exercises: Standing   Horizontal ABduction  Strengthening;Both;10 reps    Horizontal ABduction Weight (lbs)  3 single arm     Other Standing Exercises  3 lbs weight press out in SLS x 10 each side         PT Education - 08/24/19 1305    Education Details  pillow, core ex, POC , prognosis    Person(s) Educated  Patient    Methods  Explanation;Demonstration;Handout    Comprehension  Verbalized understanding;Verbal cues required;Returned demonstration       PT Short Term Goals -  08/17/19 1101      PT SHORT TERM GOAL #1   Title  Pt will be I with basic HEP    Baseline  needs cues    Status  On-going      PT SHORT TERM GOAL #2   Title  Pt will be able to get in and out of bed with less strain on neck, difficulty in UEs.    Baseline  puts alot of pressure on her neck    Status  On-going      PT SHORT TERM GOAL #3   Title  Pt will show corrected posture and report awareness in sitting and with functional activity    Status  On-going        PT Long Term Goals - 08/11/19 2005      PT LONG TERM GOAL #1   Title  Pt will be able to improve FOTO score to less than 40% impaired    Status  Unable to assess      PT LONG TERM GOAL #2   Title  Pt will be able to rotate cervical spine to L 50 deg or more for improved driving and interaction with environment    Status  Unable to assess      PT LONG TERM GOAL #3   Title  Pt will be able to reach, lift light items in her home without increased  back , neck pain.    Status  Revised      PT LONG TERM GOAL #4   Title  Pt will be I with more advanced HEP for UE strength, trunk and neck AROM as of last visit.    Period  Weeks    Status  Revised    Target Date  09/16/19      PT LONG TERM GOAL #5   Title  Pt will report 50% less stiffness in AM in low back    Time  4    Period  Weeks    Status  New    Target Date  09/16/19            Plan - 08/24/19 1250    Clinical Impression Statement  Pt with less pain overall, chronicity of symptoms limiting resolution.  She does report less pain after her routine.  Standing takes pain from 3/10 to 5/10 today. Will have 1 more visit and consider more PT in the future if she feels more comfortable.    PT Treatment/Interventions  ADLs/Self Care Home Management;Therapeutic activities;Patient/family education;Taping;Therapeutic exercise;Moist Heat;Cryotherapy;Traction;Ultrasound;Functional mobility training;Neuromuscular re-education;Manual techniques;Dry needling;Passive range  of motion    PT Next Visit Plan  goals, review full HEP and DC, foto    PT Home Exercise Plan  [email or web address]  chin tuck, scap squeeze, UT and LS stretch , corener stretch, rotation SNAG, GHJ flx with pillow case, L stretch seated, standing scap, neck stab, LTR, standing hip, PPT; seated HSS, piriformis stretch    Consulted and Agree with Plan of Care  Patient       Patient will benefit from skilled therapeutic intervention in order to improve the following deficits and impairments:  Increased fascial restricitons, Impaired sensation, Pain, Postural dysfunction, Decreased mobility, Decreased range of motion, Decreased strength, Impaired UE functional use, Obesity, Impaired flexibility, Decreased balance  Visit Diagnosis: Cervicalgia  Muscle weakness (generalized)  Abnormal posture  Chronic bilateral low back pain, unspecified whether sciatica present     Problem List Patient Active Problem List   Diagnosis Date Noted  . Pre-op exam 01/21/2018  . Neck pain 10/25/2017  . Paresthesias in left hand 10/25/2017  . Paresthesia of foot, bilateral 04/12/2017  . Primary osteoarthritis of both hands 09/27/2016  . Primary osteoarthritis of  both knees 09/27/2016  . Urinary frequency 09/26/2016  . Screen for STD (sexually transmitted disease) 09/26/2016  . 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  . Nasopharyngitis, 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/2016  . Postnasal drip 05/18/2016  . S/P total knee replacement 02/13/2016  . Chronic pain of both knees 12/06/2015  . Globus pharyngeus 05/17/2015  . Productive cough 05/17/2015  . Genetic testing 12/23/2014  . Family history of breast cancer   . Family history of colon cancer   . Family history of pancreatic cancer   . Breast cancer of upper-outer quadrant of right female breast ([place]) 11/23/2014  . Allergy to ACE inhibitors 09/27/2014  . Encounter for Medicare annual wellness exam 06/27/2014  . Prediabetes 06/23/2013  . Routine general medical examination at a  health care facility 06/23/2013  . RLS (restless legs syndrome) 01/15/2013  . Hx of Clostridium difficile infection 10/10/2012  . Bronchiectasis with acute exacerbation ([place]) 04/29/2012  . Dyspnea on exertion 04/02/2012  . Non-ischemic cardiomyopathy ([place]) 03/07/2012  . Colon cancer screening 01/11/2012  . Post-menopausal 01/11/2012  . Chest pain 11/07/2011  . Palpitations 04/17/2011  . Abnormal tympanic membrane 01/22/2011  . Obstructive sleep apnea 04/04/2010  . VENTRICULAR TACHYCARDIA 01/03/2009  . ICD  [employer]  Single chamber 01/03/2009  . PATENT FORAMEN OVALE 09/29/2008  . Vitamin D deficiency 08/17/2008  . Hypothyroidism 11/18/2006  . Hyperlipidemia 11/18/2006  . Depression with anxiety 11/18/2006  . Essential hypertension 11/18/2006  . MITRAL VALVE PROLAPSE 11/18/2006  . CEREBRAL ANEURYSM 11/18/2006  . Allergic rhinitis 11/18/2006  . GERD 11/18/2006  . DIVERTICULOSIS, COLON 11/18/2006  . Fatty liver 11/18/2006  . Fibromyalgia 11/18/2006    Joleah Kosak 08/24/2019, 1:12 PM  [facility] [address] [place], [place], [address] Phone: [phone]   Fax:  [phone]  Name: [name] MRN: [id number] Date of Birth: [birth year]  Raeford Razor, PT 08/24/19 1:12 PM Phone: [phone] Fax: [phone]

## 2019-08-25 ENCOUNTER — Other Ambulatory Visit: Payer: Self-pay | Admitting: Internal Medicine

## 2019-08-26 ENCOUNTER — Encounter: Payer: Self-pay | Admitting: Physical Therapy

## 2019-08-26 ENCOUNTER — Ambulatory Visit: Payer: Medicare Other | Admitting: Physical Therapy

## 2019-08-26 DIAGNOSIS — R293 Abnormal posture: Secondary | ICD-10-CM

## 2019-08-26 DIAGNOSIS — M545 Low back pain, unspecified: Secondary | ICD-10-CM

## 2019-08-26 DIAGNOSIS — G8929 Other chronic pain: Secondary | ICD-10-CM

## 2019-08-26 DIAGNOSIS — M542 Cervicalgia: Secondary | ICD-10-CM | POA: Diagnosis not present

## 2019-08-26 DIAGNOSIS — M6281 Muscle weakness (generalized): Secondary | ICD-10-CM

## 2019-08-26 NOTE — Therapy (Signed)
[place], [place], [address] Phone: [phone]   Fax:  [phone]  Physical Therapy Treatment/Discharge  Patient Details  Name: [name] MRN: [id number] Date of Birth: 21-Nov-1950 Referring Provider (PT): Hazel Sams, PA-C    I connected with  [name] on 08/26/19 by a video enabled telemedicine application and verified that I am speaking with the correct person using two identifiers.   I discussed the limitations of evaluation and management by telemedicine. The patient expressed understanding and agreed to proceed.  Encounter Date: 08/26/2019  PT End of Session - 08/26/19 1223    Visit Number  8    Date for PT Re-Evaluation  09/16/19    Authorization Type  MCR   PN at visit 10    PT Start Time  1215    PT Stop Time  1254    PT Time Calculation (min)  39 min       Past Medical History:  Diagnosis Date  . AICD (automatic cardioverter/defibrillator) present   . Anxiety   . Arthritis   . 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 Cerebral aneurysm, nonruptured    had a clip put in  . Clostridium difficile infection   . Depressive disorder, not elsewhere classified   . Diverticulosis of colon (without mention of hemorrhage)   . Esophageal reflux   . Fibromyalgia   . Gastritis   . GERD (gastroesophageal reflux disease)   . GI bleed 2004  . Hiatal hernia   . Hyperlipidemia   . Hypertension   . Hypothyroidism   . Internal hemorrhoids   . Obstructive sleep apnea (adult) (pediatric)   . Ostium secundum type atrial septal defect   . Other chronic nonalcoholic liver disease   . Other pulmonary embolism and infarction   . Paroxysmal ventricular tachycardia ([place])   . Personal history of radiation therapy   . Pneumonia    history  . Presence of permanent cardiac pacemaker   . PUD (peptic ulcer disease)   . Radiation 02/03/15-03/10/15   Right Breast  . Sarcoid     per pt , not sure  . Schatzki's ring   . Stroke [facility]) 2013   tia/ pt feels it was around 2008 0r 2009  . Takotsubo syndrome   . Tubular adenoma of colon   . Unspecified transient cerebral ischemia   . Unspecified vitamin D deficiency     Past Surgical History:  Procedure Laterality Date  . ABI  2006   normal  . BREAST EXCISIONAL BIOPSY    . BREAST LUMPECTOMY WITH RADIOACTIVE SEED LOCALIZATION Right 01/03/2015   Procedure: BREAST LUMPECTOMY WITH RADIOACTIVE SEED LOCALIZATION;  Surgeon: Autumn Messing III, MD;  Location: [place];  Service: General;  Laterality: Right;  . CARDIAC DEFIBRILLATOR PLACEMENT  2006; 2012   BSX single chamber ICD  . carotid dopplers  2006   neg  . CEREBRAL ANEURYSM REPAIR  02/1999  . COLONOSCOPY    . hospitalization  2004   GI bleed, PUD, diverticulosis (EGD,colonscopy)  . hospitalization     PE, NSVT, s/p defib  . KNEE ARTHROSCOPY     bilateral  . LEFT HEART CATHETERIZATION WITH CORONARY ANGIOGRAM N/A 02/22/2012   Procedure: LEFT HEART CATHETERIZATION WITH CORONARY ANGIOGRAM;  Surgeon: Burnell Blanks, MD;  Location: [facility] CATH LAB;  Service: Cardiovascular;  Laterality: N/A;  . PARTIAL HYSTERECTOMY     Fibroids  . TONGUE BIOPSY  12/12/2017   due to sore tongue and white patches/abnormal cells  . TOTAL KNEE ARTHROPLASTY Left 02/13/2016   Procedure: TOTAL KNEE ARTHROPLASTY;  Surgeon: Vickey Huger, MD;  Location: [place];  Service: Orthopedics;  Laterality: Left;  . UPPER GASTROINTESTINAL ENDOSCOPY      There were no vitals filed for this visit.  Subjective Assessment - 08/26/19 1855    Subjective  Neck is better, still really hurts in when I lay down at night. Right now , 0/10 and LOw back is 4/10. Been doing my stretches, I would like to be able to get out and walk more but my knee gives out.    Currently in Pain?  Yes    Pain Score  4     Pain Location  Back    Pain Orientation  Lower    Pain Descriptors / Indicators  Aching    Pain Type  Chronic  pain    Pain Onset  More than a month ago    Pain Frequency  Intermittent    Aggravating Factors   standing    Pain Relieving Factors  meds, heat    Pain Score  0    Pain Location  Neck         [facility] PT Assessment - 08/26/19 0001      Observation/Other Assessments   Focus on Therapeutic Outcomes (FOTO)   44%      AROM   Cervical - Right Side Bend  40    Cervical - Left Side Bend  40    Cervical - Right Rotation  70    Cervical - Left Rotation  60    Lumbar Flexion  WFL    Lumbar Extension  50%      Strength   Overall Strength Comments  unable to test via telehealth       Transfers   Five time sit to stand comments   25 sec more knee pain , no back pain         [facility] Adult PT Treatment/Exercise - 08/26/19 0001      Neck Exercises: Seated   Neck Retraction  10 reps      Neck Exercises: Stabilization   Stabilization  scapular retraction x 10       Lumbar Exercises: Stretches   Active Hamstring Stretch  3 reps;30 seconds    Active Hamstring Stretch Limitations  cues     Piriformis Stretch  2 reps;30 seconds    Piriformis Stretch Limitations  seated figure 4     Other Lumbar Stretch Exercise  seated lateral trunk stretch arms on desk , then arms overhead for extension     Other Lumbar Stretch Exercise  seated trunk flexion x 5 encouraged breathing       Lumbar Exercises: Standing   Functional Squats  10 reps    Functional Squats Limitations  2 sets one with arms and one without     Other Standing Lumbar Exercises  standing green band flexion x 10 for full body extension       Knee/Hip Exercises: Standing   Hip Abduction  Stengthening;Both;1 set;20 reps;Knee straight    Abduction Limitations  less pain when core engaged     Hip Extension  Stengthening;Both;1 set;20 reps;Knee straight             PT Education - 08/26/19 1857    Education Details  DC, plan to return for manual if able, walking for physical and mental health, pillow, massaging heating  pad     Person(s) Educated  Patient    Methods  Explanation    Comprehension  Verbalized understanding       PT Short Term Goals - 08/26/19 1219      PT SHORT TERM GOAL #1   Title  Pt will be I with basic HEP    Status  Achieved      PT SHORT TERM GOAL #2   Title  Pt will be able to get in and out of bed with less strain on neck, difficulty in UEs.    Status  Not Met      PT SHORT TERM GOAL #3   Title  Pt will show corrected posture and report awareness in sitting and with functional activity    Status  Achieved        PT Long Term Goals - 08/26/19 1222      PT LONG TERM GOAL #1   Title  Pt will be able to improve FOTO score to less than 40% impaired    Baseline  44%    Status  Not Met      PT LONG TERM GOAL #2   Title  Pt will be able to rotate cervical spine to L 50 deg or more for improved driving and interaction with environment    Status  Achieved      PT LONG TERM GOAL #3   Title  Pt will be able to reach, lift light items in her home without increased  back , neck pain.    Status  Partially Met      PT LONG TERM GOAL #4   Title  Pt will be I with more advanced HEP for UE strength, trunk and neck AROM as of last visit.    Status  Achieved      PT LONG TERM GOAL #5   Title  Pt will report 50% less stiffness in AM in low back    Status  Not Met            Plan - 08/26/19 1904    Clinical Impression Statement  Patient is dischargd from PT as she has an HEP and has been able to report less neck pain during ADLs.  Low back pain is still an issue but at this point she will plan to continue her HEP, increase walking if weather permits.  Pain increases with less than 5 min of standing.    PT Treatment/Interventions  ADLs/Self Care Home Management;Therapeutic activities;Patient/family education;Taping;Therapeutic exercise;Moist Heat;Cryotherapy;Traction;Ultrasound;Functional mobility training;Neuromuscular re-education;Manual techniques;Dry needling;Passive range of motion     PT Next Visit Plan  goals, review full HEP and DC, foto    PT Home Exercise Plan  [email or web address]  chin tuck, scap squeeze, UT and LS stretch , corener stretch, rotation SNAG, GHJ flx with pillow case, L stretch seated, standing scap, neck stab, LTR, standing hip, PPT; seated HSS, piriformis stretch    Consulted and Agree with Plan of Care  Patient       Patient will benefit from skilled therapeutic intervention in order to improve the following deficits and impairments:  Increased fascial restricitons, Impaired sensation, Pain, Postural dysfunction, Decreased mobility, Decreased range of motion, Decreased strength, Impaired UE functional use, Obesity, Impaired flexibility, Decreased balance  Visit Diagnosis: Cervicalgia  Muscle weakness (generalized)  Abnormal posture  Chronic bilateral low back pain, unspecified whether sciatica present     Problem List Patient Active Problem List   Diagnosis Date Noted  . Pre-op exam  01/21/2018  . Neck pain 10/25/2017  . Paresthesias in left hand 10/25/2017  . Paresthesia of foot, bilateral 04/12/2017  . Primary osteoarthritis of both hands 09/27/2016  . Primary osteoarthritis of both knees 09/27/2016  . Urinary frequency 09/26/2016  . Screen for STD (sexually transmitted disease) 09/26/2016  . 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  . Nasopharyngitis, 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/2016  . Postnasal drip 05/18/2016  . S/P total knee replacement 02/13/2016  . Chronic pain of both knees 12/06/2015  . Globus pharyngeus 05/17/2015  . Productive cough 05/17/2015  . Genetic testing 12/23/2014  . Family history of breast cancer   . Family history of colon cancer   . Family history of pancreatic cancer   . Breast cancer of upper-outer quadrant of right female breast ([place]) 11/23/2014  . Allergy to ACE inhibitors 09/27/2014  . Encounter for Medicare annual wellness exam 06/27/2014  . Prediabetes 06/23/2013  . Routine general medical examination at a health care  facility 06/23/2013  . RLS (restless legs syndrome) 01/15/2013  . Hx of Clostridium difficile infection 10/10/2012  . Bronchiectasis with acute exacerbation ([place]) 04/29/2012  . Dyspnea on exertion 04/02/2012  . Non-ischemic cardiomyopathy ([place]) 03/07/2012  . Colon cancer screening 01/11/2012  . Post-menopausal 01/11/2012  . Chest pain 11/07/2011  . Palpitations 04/17/2011  . Abnormal tympanic membrane 01/22/2011  . Obstructive sleep apnea 04/04/2010  . VENTRICULAR TACHYCARDIA 01/03/2009  . ICD  [employer]  Single chamber 01/03/2009  . PATENT FORAMEN OVALE 09/29/2008  . Vitamin D deficiency 08/17/2008  . Hypothyroidism 11/18/2006  . Hyperlipidemia 11/18/2006  . Depression with anxiety 11/18/2006  . Essential hypertension 11/18/2006  . MITRAL VALVE PROLAPSE 11/18/2006  . CEREBRAL ANEURYSM 11/18/2006  . Allergic rhinitis 11/18/2006  . GERD 11/18/2006  . DIVERTICULOSIS, COLON 11/18/2006  . Fatty liver 11/18/2006  . Fibromyalgia 11/18/2006    Kieran Arreguin 08/26/2019, 7:06 PM  [facility] [address] [place], [place], [address] Phone: [phone]   Fax:  [phone]  Name: ANDRIKA PERAZA MRN: [id number] Date of Birth: 1951/03/30  PHYSICAL THERAPY DISCHARGE SUMMARY  Visits from Start of Care: 8  Current functional level related to goals / functional outcomes: See above    Remaining deficits: See above, unable to test MMT via telehealth but appears [facility] in LEs    Education / Equipment: HEP , posture, self care strategies  Plan: Patient agrees to discharge.  Patient goals were partially met. Patient is being discharged due to being pleased with the current functional level.  ?????    Raeford Razor, PT 08/26/19 7:08 PM Phone: [phone] Fax: [phone]

## 2019-09-01 ENCOUNTER — Ambulatory Visit (INDEPENDENT_AMBULATORY_CARE_PROVIDER_SITE_OTHER): Payer: Medicare Other | Admitting: Family Medicine

## 2019-09-01 ENCOUNTER — Encounter: Payer: Self-pay | Admitting: Family Medicine

## 2019-09-01 ENCOUNTER — Other Ambulatory Visit: Payer: Self-pay

## 2019-09-01 VITALS — BP 124/70 | HR 81 | Temp 96.9°F | Ht 66.5 in | Wt 228.1 lb

## 2019-09-01 DIAGNOSIS — R109 Unspecified abdominal pain: Secondary | ICD-10-CM | POA: Diagnosis not present

## 2019-09-01 DIAGNOSIS — E860 Dehydration: Secondary | ICD-10-CM | POA: Diagnosis not present

## 2019-09-01 DIAGNOSIS — I4729 Other ventricular tachycardia: Secondary | ICD-10-CM

## 2019-09-01 DIAGNOSIS — I472 Ventricular tachycardia, unspecified: Secondary | ICD-10-CM

## 2019-09-01 DIAGNOSIS — R1030 Lower abdominal pain, unspecified: Secondary | ICD-10-CM | POA: Insufficient documentation

## 2019-09-01 DIAGNOSIS — M797 Fibromyalgia: Secondary | ICD-10-CM

## 2019-09-01 DIAGNOSIS — R1031 Right lower quadrant pain: Secondary | ICD-10-CM

## 2019-09-01 DIAGNOSIS — R10A1 Flank pain, right side: Secondary | ICD-10-CM

## 2019-09-01 DIAGNOSIS — K760 Fatty (change of) liver, not elsewhere classified: Secondary | ICD-10-CM

## 2019-09-01 DIAGNOSIS — I428 Other cardiomyopathies: Secondary | ICD-10-CM

## 2019-09-01 LAB — POCT URINALYSIS DIPSTICK
Bilirubin, UA: 5
Blood, UA: NEGATIVE
Glucose, UA: NEGATIVE
Leukocytes, UA: NEGATIVE
Nitrite, UA: NEGATIVE
Protein, UA: NEGATIVE
Spec Grav, UA: >=1.03 — AB (ref 1.010–1.025)
Urobilinogen, UA: 0.2 E.U./dL
pH, UA: 6 (ref 5.0–8.0)

## 2019-09-01 NOTE — Assessment & Plan Note (Signed)
Per pt -more neck problems lately  Takes tramadol 1 pill at bedtime Avoids acetaminophen due to inc liver enzymes in the past  She is considering pain clinic

## 2019-09-01 NOTE — Progress Notes (Signed)
Subjective:    Patient ID: [name], female    DOB: [birth year], 69 y.o.   MRN: [id number]  This visit occurred during the SARS-CoV-2 public health emergency.  Safety protocols were in place, including screening questions prior to the visit, additional usage of staff PPE, and extensive cleaning of exam room while observing appropriate contact time as indicated for disinfecting solutions.    HPI Pt presents for R side pain   Wt Readings from Last 3 Encounters:  09/01/19 228 lb 1 oz (103.4 kg)  08/04/19 225 lb 12.8 oz (102.4 kg)  06/17/19 223 lb (101.2 kg)   36.26 kg/m   Has had discomfort in R lower abdomen for a while  It started after her last colonoscopy  Saw GI  It comes and goes   Not worse lately  Eating makes no difference  BMs are normal with miralax  She does not take any fiber   She drinks 4 glasses of fluids per day     Gets loud noises from that side of her abdomen     Last CT abd/pel was 6/19  CT ABDOMEN PELVIS W CONTRAST (Accession [id number]) (Order [id number]) Imaging Date: 02/11/2018 Department: [facility] Released By: Doretha Sou, NT Authorizing: Jerene Bears, MD  Exam Status  Status  Final [99]  PACS Intelerad Image Link  Show images for CT ABDOMEN PELVIS W CONTRAST  Study Result  CLINICAL DATA:  One year history of mid to right lower quadrant abdominal pain.  EXAM: CT ABDOMEN AND PELVIS WITH CONTRAST  TECHNIQUE: Multidetector CT imaging of the abdomen and pelvis was performed using the standard protocol following bolus administration of intravenous contrast.  CONTRAST:  114mL ISOVUE-300 IOPAMIDOL (ISOVUE-300) INJECTION 61%  COMPARISON:  Chest CT 10/26/2015  FINDINGS: Lower chest: Bibasilar scarring changes and mild bronchiectasis possibly suggesting interstitial lung disease. Findings appear relatively stable. A high-resolution chest CT may be helpful for further evaluation. No  worrisome pulmonary lesions. The heart is mildly enlarged. Pacer wires in good position without complicating features. The distal esophagus is grossly normal.  Hepatobiliary: No focal hepatic lesions or intrahepatic biliary dilatation. The gallbladder is unremarkable. No common bile duct dilatation.  Pancreas: No mass, inflammation or ductal dilatation. Mild pancreatic atrophy.  Spleen: Normal size.  No focal lesions.  Adrenals/Urinary Tract: Adrenal glands and kidneys are unremarkable. Tiny scattered renal cysts and evidence of prior cortical scarring changes. No renal, ureteral or bladder calculi or mass. Mild cystocele noted.  Stomach/Bowel: The stomach, duodenum, small bowel and colon are unremarkable. No acute inflammatory changes, mass lesions or obstructive findings. The terminal ileum is normal. The appendix is normal.  Diffuse colonic diverticulosis without definite findings for acute diverticulitis. Infraumbilical abdominal wall hernia contains part of the transverse colon. No findings for incarceration or obstruction. This could be causing the patient's pain.  Vascular/Lymphatic: Mild atherosclerotic calcifications involving the aorta and iliac arteries. No aneurysm or dissection. The branch vessels are patent. The major venous structures are patent.  No mesenteric or retroperitoneal mass or adenopathy. Small scattered lymph nodes are within normal limits.  Reproductive: Status post hysterectomy. Both ovaries are still present. Adjacent to the right ovary and extending into the midline of the cul-de-sac is a serpiginous appearing cystic structure which is most likely a hydrosalpinx. No worrisome imaging features. A follow-up pelvic ultrasound examination may be helpful to confirm and follow this lesion. It measures a maximum of 7.0 x 5.8 cm.  Other: No inguinal  mass or adenopathy. No worrisome subcutaneous lesions.  Musculoskeletal: Advanced  degenerative changes involving the spine but no worrisome bone lesions.  IMPRESSION: 1. Infraumbilical abdominal wall hernia containing part of the transverse colon. No findings for obstruction or incarceration but this could be a source of the patient's midline abdominal pain. 2. Diffuse colonic diverticulosis without findings for acute diverticulitis. 3. No acute abdominal/pelvic findings, mass lesions or lymphadenopathy. 4. Serpiginous appearing cystic lesion in the pelvis closely associated with the right ovary is suspicious for a hydrosalpinx. Recommend ultrasound correlation. 5. Status post hysterectomy.  Mild cystocele. 6. Suspect interstitial lung disease with bronchiectasis at the lung bases. A high-resolution chest CT for interstitial lung disease may be helpful for further evaluation, particularly if the patient is symptomatic.   Electronically Signed   By: Marijo Sanes M.D.   On: 02/12/2018 12:49    She went to gyn re: the cyst near R ovary  Told "it had been there a while" Did not do anything about it   Colonoscopy showed some small polyps    Takes protonix  ? Took something for colon spasm and it did not help  No blood in stool    Results for orders placed or performed in visit on 09/01/19  Urinalysis Dipstick  Result Value Ref Range   Color, UA Yellow    Clarity, UA Clear    Glucose, UA Negative Negative   Bilirubin, UA 5 mg/dL    Ketones, UA Trace    Spec Grav, UA >=1.030 (A) 1.010 - 1.025   Blood, UA Negative    pH, UA 6.0 5.0 - 8.0   Protein, UA Negative Negative   Urobilinogen, UA 0.2 0.2 or 1.0 E.U./dL   Nitrite, UA Negative    Leukocytes, UA Negative Negative   Appearance n/a    Odor none     Last liver tests had improved Lab Results  Component Value Date   ALT 42 (H) 05/05/2019   AST 34 05/05/2019   ALKPHOS 94 05/05/2019   BILITOT 0.4 05/05/2019   She watches her tylenol    Patient Active Problem List   Diagnosis Date Noted   . Right lower quadrant abdominal pain 09/01/2019  . Dehydration 09/01/2019  . Pre-op exam 01/21/2018  . Neck pain 10/25/2017  . Paresthesias in left hand 10/25/2017  . Paresthesia of foot, bilateral 04/12/2017  . Primary osteoarthritis of both hands 09/27/2016  . Primary osteoarthritis of both knees 09/27/2016  . Urinary frequency 09/26/2016  . Screen for STD (sexually transmitted disease) 09/26/2016  . 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  . Nasopharyngitis, 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/2016  . Postnasal drip 05/18/2016  . S/P total knee replacement 02/13/2016  . Chronic pain of both knees 12/06/2015  . Globus pharyngeus 05/17/2015  . Productive cough 05/17/2015  . Genetic testing 12/23/2014  . Family history of breast cancer   . Family history of colon cancer   . Family history of pancreatic cancer   . History of breast cancer 11/23/2014  . Allergy to ACE inhibitors 09/27/2014  . Encounter for Medicare annual wellness exam 06/27/2014  . Prediabetes 06/23/2013  . Routine general medical examination at a health care facility 06/23/2013  . RLS (restless legs syndrome) 01/15/2013  . Hx of Clostridium difficile infection 10/10/2012  . Dyspnea on exertion 04/02/2012  . NICM (nonischemic cardiomyopathy) ([place]) 03/07/2012  . Colon cancer screening 01/11/2012  . Post-menopausal 01/11/2012  . Chest pain 11/07/2011  . Palpitations 04/17/2011  . Abnormal tympanic membrane 01/22/2011  .  Obstructive sleep apnea 04/04/2010  . VENTRICULAR TACHYCARDIA 01/03/2009  . ICD  [employer]  Single chamber 01/03/2009  . PATENT FORAMEN OVALE 09/29/2008  . Vitamin D deficiency 08/17/2008  . Hypothyroidism 11/18/2006  . Hyperlipidemia 11/18/2006  . Depression with anxiety 11/18/2006  . Essential hypertension 11/18/2006  . MITRAL VALVE PROLAPSE 11/18/2006  . CEREBRAL ANEURYSM 11/18/2006  . Allergic rhinitis 11/18/2006  . GERD 11/18/2006  . DIVERTICULOSIS, COLON 11/18/2006  . Fatty liver 11/18/2006    . Fibromyalgia 11/18/2006   Past Medical History:  Diagnosis Date  . AICD (automatic cardioverter/defibrillator) present   . Anxiety   . Arthritis   . 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 Cerebral aneurysm, nonruptured    had a clip put in  . Clostridium difficile infection   . Depressive disorder, not elsewhere classified   . Diverticulosis of colon (without mention of hemorrhage)   . Esophageal reflux   . Fibromyalgia   . Gastritis   . GERD (gastroesophageal reflux disease)   . GI bleed 2004  . Hiatal hernia   . Hyperlipidemia   . Hypertension   . Hypothyroidism   . Internal hemorrhoids   . Obstructive sleep apnea (adult) (pediatric)   . Ostium secundum type atrial septal defect   . Other chronic nonalcoholic liver disease   . Other pulmonary embolism and infarction   . Paroxysmal ventricular tachycardia ([place])   . Personal history of radiation therapy   . Pneumonia    history  . Presence of permanent cardiac pacemaker   . PUD (peptic ulcer disease)   . Radiation 02/03/15-03/10/15   Right Breast  . Sarcoid    per pt , not sure  . Schatzki's ring   . Stroke [facility]) 2013   tia/ pt feels it was around 2008 0r 2009  . Takotsubo syndrome   . Tubular adenoma of colon   . Unspecified transient cerebral ischemia   . Unspecified vitamin D deficiency    Past Surgical History:  Procedure Laterality Date  . ABI  2006   normal  . BREAST EXCISIONAL BIOPSY    . BREAST LUMPECTOMY WITH RADIOACTIVE SEED LOCALIZATION Right 01/03/2015   Procedure: BREAST LUMPECTOMY WITH RADIOACTIVE SEED LOCALIZATION;  Surgeon: Autumn Messing III, MD;  Location: [facility];  Service: General;  Laterality: Right;  . CARDIAC DEFIBRILLATOR PLACEMENT  2006; 2012   BSX single chamber ICD  . carotid dopplers  2006   neg  . CEREBRAL ANEURYSM REPAIR  02/1999  . COLONOSCOPY    . hospitalization  2004   GI bleed, PUD, diverticulosis (EGD,colonscopy)  . hospitalization     PE, NSVT,  s/p defib  . KNEE ARTHROSCOPY     bilateral  . LEFT HEART CATHETERIZATION WITH CORONARY ANGIOGRAM N/A 02/22/2012   Procedure: LEFT HEART CATHETERIZATION WITH CORONARY ANGIOGRAM;  Surgeon: Burnell Blanks, MD;  Location: [facility] CATH LAB;  Service: Cardiovascular;  Laterality: N/A;  . PARTIAL HYSTERECTOMY     Fibroids  . TONGUE BIOPSY  12/12/2017   due to sore tongue and white patches/abnormal cells  . TOTAL KNEE ARTHROPLASTY Left 02/13/2016   Procedure: TOTAL KNEE ARTHROPLASTY;  Surgeon: Vickey Huger, MD;  Location: [facility];  Service: Orthopedics;  Laterality: Left;  . UPPER GASTROINTESTINAL ENDOSCOPY     Social History   Tobacco Use  . Smoking status: Former Smoker    Packs/day: 1.00    Years: 15.00    Pack years: 15.00    Types:  Cigarettes    Quit date: 08/20/1989    Years since quitting: 30.0  . Smokeless tobacco: Never Used  . Tobacco comment: Started at 33; less than 1 PPD  Substance Use Topics  . Alcohol use: Yes    Alcohol/week: 0.0 standard drinks    Comment: rare  . Drug use: No   Family History  Problem Relation Age of Onset  . Diabetes Mother   . Alzheimer's disease Mother   . Other Brother        Thyroid problem 10/2016  . Allergies Sister   . Heart disease Sister   . Heart disease Brother   . Prostate cancer Brother 5       same brother as throat cancer  . Throat cancer Brother        dx in his 64s; also a smoker  . Heart disease Father   . Cancer Maternal Grandmother 53       colon cancer or abdominal cancer  . Lung cancer Maternal Grandfather 14  . Breast cancer Maternal Aunt 34  . Colon cancer Maternal Aunt 74       same sister as breast at 20  . Lung cancer Maternal Uncle   . Breast cancer Maternal Aunt        dx in her 37s  . Pancreatic cancer Cousin 28       maternal first cousin  . Breast cancer Cousin        paternal first cousin twice removed died in her 46s  . Cardiomyopathy Other        Family history  . Heart disease Son        Cardiac  Arrest 07/2016  . Stroke Neg Hx    Allergies  Allergen Reactions  . Ace Inhibitors Swelling    Angioedema; makes tongue "break out"   . Amitriptyline Hcl Other (See Comments)     makes her too sleepy!  . Atorvastatin Other (See Comments)    SEVERE MYALGIA  . Cymbalta [Duloxetine Hcl] Nausea Only and Other (See Comments)    Sleepiness/ sick  . Dilantin [Phenytoin Sodium Extended] Rash    Severe rash  . Paroxetine Nausea Only    Rapid heartbeat  . Ramipril Other (See Comments)    TONGUE ULCERS   . Rosuvastatin Other (See Comments)    SEVERE MYALGIA  . Carbamazepine Rash  . Codeine Itching  . Iodine-131 Other (See Comments)    Swelling at IV site only, no swelling or SOB around mouth.!  . Phenytoin Sodium Extended Rash  . Wellbutrin [Bupropion] Palpitations   Current Outpatient Medications on File Prior to Visit  Medication Sig Dispense Refill  . albuterol (PROVENTIL) (2.5 MG/3ML) 0.083% nebulizer solution USE 1 VIAL IN NEBULIZER EVERY 6 HOURS - and as needed 75 mL 11  . albuterol (VENTOLIN HFA) 108 (90 Base) MCG/ACT inhaler Inhale 2 puffs into the lungs every 6 (six) hours as needed for wheezing or shortness of breath. 18 g 3  . Ascorbic Acid (VITAMIN C) 1000 MG tablet Take 1,000 mg by mouth daily.    [name] aspirin 81 MG tablet Take 81 mg by mouth daily.    . baclofen (LIORESAL) 10 MG tablet TAKE 1 TABLET THREE TIMES DAILY (Patient taking differently: 10 mg. ) 270 tablet 0  . calcium carbonate (OS-CAL - DOSED IN MG OF ELEMENTAL CALCIUM) 1250 (500 Ca) MG tablet Take 1 tablet by mouth.    . carvedilol (COREG) 12.5 MG tablet TAKE 1 TABLET TWICE  DAILY 180 tablet 1  . cholecalciferol (VITAMIN D) 1000 UNITS tablet Take 5,000 Units by mouth daily.     . diclofenac (FLECTOR) 1.3 % PTCH Place 1 patch onto the skin 2 (two) times daily. 30 patch 0  . fexofenadine (ALLEGRA) 180 MG tablet Take 180 mg by mouth daily as needed for allergies or rhinitis.    . fluticasone (FLONASE) 50 MCG/ACT  nasal spray Place 2 sprays into both nostrils daily as needed for allergies or rhinitis. 48 g 3  . furosemide (LASIX) 40 MG tablet Take 1 tablet (40 mg total) by mouth every other day. (Patient taking differently: Take 20 mg by mouth daily. ) 45 tablet 3  . gabapentin (NEURONTIN) 300 MG capsule Take 1 capsule (300 mg total) by mouth 2 (two) times daily. 60 capsule 3  . hydroxypropyl methylcellulose / hypromellose (ISOPTO TEARS / GONIOVISC) 2.5 % ophthalmic solution Place 1 drop into both eyes daily.    [name] latanoprost (XALATAN) 0.005 % ophthalmic solution Place 1 drop into both eyes at bedtime.    [name] levothyroxine (SYNTHROID) 50 MCG tablet Take 1 tablet (50 mcg total) by mouth daily. 90 tablet 3  . Magnesium Oxide 400 (240 Mg) MG TABS Take 1 tablet (400 mg total) by mouth daily. 90 tablet 3  . mometasone (ELOCON) 0.1 % lotion Apply 1 application topically daily as needed (rash).     . montelukast (SINGULAIR) 10 MG tablet TAKE 1 TABLET AT BEDTIME 90 tablet 3  . Multiple Vitamins-Minerals (MULTIVITAMIN PO) Take 1 tablet by mouth daily.    . pantoprazole (PROTONIX) 40 MG tablet Take 1 tablet (40 mg total) by mouth daily. 90 tablet 1  . polyethylene glycol powder (GLYCOLAX/MIRALAX) powder Take 17 grams by mouth daily 1080 g 0  . potassium chloride (K-DUR) 10 MEQ tablet TAKE 1/2 TABLET (5MEQ TOTAL) EVERY DAY 45 tablet 3  . Respiratory Therapy Supplies (FLUTTER) DEVI 1 Device by Does not apply route as needed. 1 each 0  . simvastatin (ZOCOR) 40 MG tablet TAKE 1/2 TABLET (20MG ) EVERY DAY 45 tablet 2  . sodium chloride HYPERTONIC 3 % nebulizer solution Take by nebulization 2 (two) times daily as needed for cough. 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 750 mL 12  . traMADol (ULTRAM) 50 MG tablet Take 1 tablet (50 mg total) by mouth 3 (three) times daily as needed. For neck pain. 21 tablet 0  . zinc gluconate 50 MG tablet Take 50 mg by mouth daily.    [name] zolpidem (AMBIEN) 10 MG tablet TAKE 1 TABLET BY MOUTH AT BEDTIME AS NEEDED  FOR SLEEP 90 tablet 1   No current facility-administered medications on file prior to visit.    Review of Systems  Constitutional: Positive for fatigue. Negative for activity change, appetite change, fever and unexpected weight change.  HENT: Negative for congestion, ear pain, rhinorrhea, sinus pressure and sore throat.   Eyes: Negative for pain, redness and visual disturbance.  Respiratory: Negative for cough, shortness of breath and wheezing.        Intermittent cough from bronchiectasis   Cardiovascular: Negative for chest pain and palpitations.  Gastrointestinal: Positive for abdominal pain. Negative for abdominal distention, anal bleeding, blood in stool, constipation, diarrhea, nausea, rectal pain and vomiting.  Endocrine: Negative for polydipsia and polyuria.  Genitourinary: Negative for dysuria, frequency and urgency.       Overactive bladder  Musculoskeletal: Positive for arthralgias, back pain and neck pain. Negative for myalgias.  Skin: Negative for pallor and rash.  Allergic/Immunologic: Negative  for environmental allergies.  Neurological: Negative for dizziness, syncope and headaches.  Hematological: Negative for adenopathy. Does not bruise/bleed easily.  Psychiatric/Behavioral: Negative for decreased concentration and dysphoric mood. The patient is not nervous/anxious.        Objective:   Physical Exam Constitutional:      General: She is not in acute distress.    Appearance: Normal appearance. She is well-developed. She is obese. She is not ill-appearing or diaphoretic.  HENT:     Head: Normocephalic and atraumatic.     Mouth/Throat:     Mouth: Mucous membranes are moist.  Eyes:     General: No scleral icterus.    Conjunctiva/sclera: Conjunctivae normal.     Pupils: Pupils are equal, round, and reactive to light.  Cardiovascular:     Rate and Rhythm: Normal rate and regular rhythm.     Pulses: Normal pulses.     Heart sounds: Murmur present.  Pulmonary:      Effort: Pulmonary effort is normal. No respiratory distress.     Breath sounds: Normal breath sounds. No wheezing or rales.  Abdominal:     General: Bowel sounds are normal. There is no distension.     Palpations: Abdomen is soft. There is no mass.     Tenderness: There is abdominal tenderness in the right lower quadrant. There is no right CVA tenderness, left CVA tenderness, guarding or rebound. Negative signs include Murphy's sign and McBurney's sign.  Musculoskeletal:     Cervical back: Normal range of motion and neck supple. No rigidity.  Lymphadenopathy:     Cervical: No cervical adenopathy.  Skin:    General: Skin is warm and dry.     Coloration: Skin is not pale.     Findings: No erythema.  Neurological:     Mental Status: She is alert.     Cranial Nerves: No cranial nerve deficit.  Psychiatric:        Mood and Affect: Mood normal.           Assessment & Plan:   Problem List Items Addressed This Visit      Cardiovascular and Mediastinum   VENTRICULAR TACHYCARDIA    No clinical changes lately      NICM (nonischemic cardiomyopathy) ([place])    No clinical changes  Continues cardiology f/u She takes lasix        Digestive   Fatty liver    Last liver enzymes in sept improved          Other   Fibromyalgia    Per pt -more neck problems lately  Takes tramadol 1 pill at bedtime Avoids acetaminophen due to inc liver enzymes in the past  She is considering pain clinic       Right lower quadrant abdominal pain - Primary    Ongoing since 2019  Reviewed GI notes from Dr Hilarie Fredrickson as well as last colonoscopy and CT scan and labs    ? R ovarian cyst seen-pt f/u with gyn (sent for those records and [place]  Diverticulosis is a possible cause (even if no inf) inst pt to inc fluids and then try fiber suppelement  For constipation continue miralax   ua today was neg but concentrated         Dehydration    Pt may be mildly dehydrated based on high SG of urine  This  could possibly add to colon issues and constipation Per pt she is not on fluid restriction from cardiology but does take lasix  inst to inc her fluid intake (to 64 oz daily) if tolerated       Other Visit Diagnoses    Right flank pain       Relevant Orders   Urinalysis Dipstick (Completed)

## 2019-09-01 NOTE — Assessment & Plan Note (Signed)
Pt may be mildly dehydrated based on high SG of urine  This could possibly add to colon issues and constipation Per pt she is not on fluid restriction from cardiology but does take lasix  inst to inc her fluid intake (to 64 oz daily) if tolerated

## 2019-09-01 NOTE — Assessment & Plan Note (Signed)
No clinical changes  Continues cardiology f/u She takes lasix

## 2019-09-01 NOTE — Patient Instructions (Addendum)
Let's send for your last gyn note and ultrasound so I can review it   I think you need more fluids Aim for 64 oz of fluid daily (mostly water)   After 1-2 weeks- then you can try a fiber supplement also (citrucel is my favorite)   Continue the miralax -do not change that   Let me know how it goes  I will also send for your gyn note and ultrasound to review

## 2019-09-01 NOTE — Assessment & Plan Note (Signed)
Last liver enzymes in sept improved

## 2019-09-01 NOTE — Assessment & Plan Note (Signed)
No clinical changes lately

## 2019-09-01 NOTE — Assessment & Plan Note (Signed)
Ongoing since 2019  Reviewed GI notes from Dr Hilarie Fredrickson as well as last colonoscopy and CT scan and labs    ? R ovarian cyst seen-pt f/u with gyn (sent for those records and [place]  Diverticulosis is a possible cause (even if no inf) inst pt to inc fluids and then try fiber suppelement  For constipation continue miralax   ua today was neg but concentrated

## 2019-09-02 ENCOUNTER — Ambulatory Visit (INDEPENDENT_AMBULATORY_CARE_PROVIDER_SITE_OTHER): Payer: Medicare Other | Admitting: *Deleted

## 2019-09-02 DIAGNOSIS — I428 Other cardiomyopathies: Secondary | ICD-10-CM | POA: Diagnosis not present

## 2019-09-02 LAB — CUP PACEART REMOTE DEVICE CHECK
Battery Remaining Longevity: 48 mo
Battery Remaining Percentage: 50 %
Brady Statistic RV Percent Paced: 0 %
Date Time Interrogation Session: 20210113030100
HighPow Impedance: 68 Ohm
Implantable Lead Implant Date: 20050603
Implantable Lead Location: 753860
Implantable Lead Model: 185
Implantable Lead Serial Number: 116340
Implantable Pulse Generator Implant Date: 20110505
Lead Channel Impedance Value: 634 Ohm
Lead Channel Pacing Threshold Amplitude: 0.7 V
Lead Channel Pacing Threshold Pulse Width: 0.4 ms
Lead Channel Setting Pacing Amplitude: 2.4 V
Lead Channel Setting Pacing Pulse Width: 0.4 ms
Lead Channel Setting Sensing Sensitivity: 0.4 mV
Pulse Gen Serial Number: 266301

## 2019-09-18 ENCOUNTER — Telehealth: Payer: Self-pay | Admitting: Rheumatology

## 2019-09-18 MED ORDER — TRAMADOL HCL 50 MG PO TABS: 50.0000 mg | ORAL_TABLET | Freq: Three times a day (TID) | ORAL | 0 refills | Status: DC | PRN

## 2019-09-18 NOTE — Telephone Encounter (Signed)
Patient left a message requesting a refill on Tramadol.

## 2019-09-18 NOTE — Telephone Encounter (Signed)
Last Visit: 06/15/19 Next Visit: 12/08/19 UDS: 06/09/19 [facility] Agreement: 06/09/19  Last Fill: 08/20/19  Okay to refill Tramadol?

## 2019-09-28 ENCOUNTER — Ambulatory Visit: Payer: Medicare Other

## 2019-10-01 ENCOUNTER — Other Ambulatory Visit (INDEPENDENT_AMBULATORY_CARE_PROVIDER_SITE_OTHER): Payer: Self-pay | Admitting: Family Medicine

## 2019-10-01 ENCOUNTER — Encounter (INDEPENDENT_AMBULATORY_CARE_PROVIDER_SITE_OTHER): Payer: Self-pay | Admitting: Family Medicine

## 2019-10-01 ENCOUNTER — Ambulatory Visit (INDEPENDENT_AMBULATORY_CARE_PROVIDER_SITE_OTHER): Payer: Medicare Other | Admitting: Family Medicine

## 2019-10-01 ENCOUNTER — Other Ambulatory Visit: Payer: Self-pay

## 2019-10-01 VITALS — BP 124/52 | HR 68 | Temp 98.0°F | Ht 66.0 in | Wt 223.0 lb

## 2019-10-01 DIAGNOSIS — R7303 Prediabetes: Secondary | ICD-10-CM

## 2019-10-01 DIAGNOSIS — M797 Fibromyalgia: Secondary | ICD-10-CM | POA: Diagnosis not present

## 2019-10-01 DIAGNOSIS — R5383 Other fatigue: Secondary | ICD-10-CM | POA: Diagnosis not present

## 2019-10-01 DIAGNOSIS — R0602 Shortness of breath: Secondary | ICD-10-CM

## 2019-10-01 DIAGNOSIS — Z0289 Encounter for other administrative examinations: Secondary | ICD-10-CM

## 2019-10-01 DIAGNOSIS — G4733 Obstructive sleep apnea (adult) (pediatric): Secondary | ICD-10-CM

## 2019-10-01 DIAGNOSIS — E559 Vitamin D deficiency, unspecified: Secondary | ICD-10-CM

## 2019-10-01 DIAGNOSIS — Z9989 Dependence on other enabling machines and devices: Secondary | ICD-10-CM

## 2019-10-01 DIAGNOSIS — F3289 Other specified depressive episodes: Secondary | ICD-10-CM

## 2019-10-01 DIAGNOSIS — K760 Fatty (change of) liver, not elsewhere classified: Secondary | ICD-10-CM

## 2019-10-01 DIAGNOSIS — Z6836 Body mass index (BMI) 36.0-36.9, adult: Secondary | ICD-10-CM | POA: Diagnosis not present

## 2019-10-01 DIAGNOSIS — E7849 Other hyperlipidemia: Secondary | ICD-10-CM

## 2019-10-01 DIAGNOSIS — E039 Hypothyroidism, unspecified: Secondary | ICD-10-CM

## 2019-10-01 DIAGNOSIS — I10 Essential (primary) hypertension: Secondary | ICD-10-CM

## 2019-10-01 NOTE — Progress Notes (Signed)
Dear Dr. Estanislado Pandy,   Thank you for referring [name] to our clinic. The following note includes my evaluation and treatment recommendations.  Chief Complaint:   OBESITY [name] (MR# [id number]) is a 69 y.o. female who presents for evaluation and treatment of obesity and related comorbidities. Current BMI is Body mass index is 35.99 kg/m. [name] has been struggling with her weight for many years and has been unsuccessful in either losing weight, maintaining weight loss, or reaching her healthy weight goal.  [name] is currently in the action stage of change and ready to dedicate time achieving and maintaining a healthier weight. [name] is interested in becoming our patient and working on intensive lifestyle modifications including (but not limited to) diet and exercise for weight loss.  [name]'s habits were reviewed today and are as follows: her desired weight loss is 42 pounds, she started gaining weight between the ages of 47-50, her heaviest weight ever was 230 pounds, she craves steak, fish, and pasta, she snacks frequently in the evenings, she wakes up frequently in the middle of the night to eat, she skips breakfast and/or lunch frequently, she is frequently drinking liquids with calories, she frequently makes poor food choices, she has problems with excessive hunger, she frequently eats larger portions than normal and she struggles with emotional eating.  Depression Screen [name]'s Food and Mood (modified PHQ-9) score was 8.  Depression screen PHQ 2/9 10/01/2019  Decreased Interest 1  Down, Depressed, Hopeless 1  PHQ - 2 Score 2  Altered sleeping 1  Tired, decreased energy 1  Change in appetite 1  Feeling bad or failure about yourself  1  Trouble concentrating 1  Moving slowly or fidgety/restless 1  Suicidal thoughts 0  PHQ-9 Score 8  Difficult doing work/chores Somewhat difficult  Some recent data might be hidden   Subjective:   1. Other fatigue [name] admits to  daytime somnolence and reports waking up still tired. Patent has a history of symptoms of daytime fatigue and morning fatigue. [name] generally gets 6-8 hours of sleep per night, and states that she has poor sleep quality. Snoring is present. Apneic episodes are present. Epworth Sleepiness Score is 12.  2. SOB (shortness of breath) on exertion [name] notes increasing shortness of breath with exercising and seems to be worsening over time with weight gain. She notes getting out of breath sooner with activity than she used to. This has gotten worse recently. [name] denies shortness of breath at rest or orthopnea.  She is a previous smoker with a 20 pack year history.  Stopped smoking in 1991.  3. OSA on CPAP [name] has a diagnosis of sleep apnea. She reports that she is using a CPAP regularly. She takes Ambien at bedtime. Situation Chance of Dozing or Sleeping  Sitting and reading 2 = moderate chance of dozing or sleeping  Watching television 2 = moderate chance of dozing or sleeping  Sitting inactive in a public place (theater or meeting) 1 = slight chance of dozing or sleeping  Lying down in the afternoon when circumstances permit 2 = moderate chance of dozing or sleeping  Sitting and talking to someone 1 = slight chance of dozing or sleeping  Sitting as a passenger in a car for an hour 1 = slight chance of dozing or sleeping  Sitting quietly after lunch without alcohol 2 = moderate chance of dozing or sleeping  In a car, while stopped for a few minutes in traffic 1 =  slight chance of dozing or sleeping  TOTAL 12   4. Fibromyalgia [name] uses chronic pain medications for her fibromyalgia (tramadol, Tylenol).  Followed by Rheumatology.   5. Hypothyroidism, unspecified type [name] takes levothyroxine 50 mcg daily.   Lab Results  Component Value Date   TSH 4.05 12/04/2018   6. Vitamin D deficiency [name]'s Vitamin D level was 50.22 on 12/04/2018. She is not currently taking vit D. She denies  nausea, vomiting or muscle weakness.  7. Fatty liver [name] has the diagnosis of fatty liver disease.  8. Essential hypertension Also with history of MI and CHF.  ICD in place.  Review: taking medications as instructed, no medication side effects noted, no chest pain on exertion, no dyspnea on exertion, no swelling of ankles.  She is taking carvedilol, Lasix and aspirin.  BP Readings from Last 3 Encounters:  10/01/19 (!) 124/52  09/01/19 124/70  08/04/19 120/68   9. Other hyperlipidemia [name] has hyperlipidemia and has been trying to improve her cholesterol levels with intensive lifestyle modification including a low saturated fat diet, exercise and weight loss. She denies any chest pain, claudication or myalgias.  Taking simvastatin 20 mg daily.  Lab Results  Component Value Date   ALT 42 (H) 05/05/2019   AST 34 05/05/2019   ALKPHOS 94 05/05/2019   BILITOT 0.4 05/05/2019   Lab Results  Component Value Date   CHOL 170 12/04/2018   HDL 44.00 12/04/2018   LDLCALC 91 12/04/2018   LDLDIRECT 141.8 07/04/2007   TRIG 175.0 (H) 12/04/2018   CHOLHDL 4 12/04/2018   10. Prediabetes [name] will continue to work on weight loss, exercise, and decreasing simple carbohydrates to help decrease the risk of diabetes.   11. Other depression, with emotional eating [name] is struggling with emotional eating and using food for comfort to the extent that it is negatively impacting her health. She has been working on behavior modification techniques to help reduce her emotional eating and has been unsuccessful. She shows no sign of suicidal or homicidal ideations.  PHQ-9 today is 8.  Assessment/Plan:   1. Other fatigue [name] does feel that her weight is causing her energy to be lower than it should be. Fatigue may be related to obesity, depression or many other causes. Labs will be ordered, and in the meanwhile, [name] will focus on self care including making healthy food choices, increasing physical  activity and focusing on stress reduction.  Orders - EKG 12-Lead - Anemia panel  2. SOB (shortness of breath) on exertion [name] does feel that she gets out of breath more easily that she used to when she exercises. [name] shortness of breath appears to be obesity related and exercise induced. She has agreed to work on weight loss and gradually increase exercise to treat her exercise induced shortness of breath. Will continue to monitor closely.  Orders - CBC with Differential/Platelet  3. OSA on CPAP Intensive lifestyle modifications are the first line treatment for this issue. We discussed several lifestyle modifications today and she will continue to work on diet, exercise and weight loss efforts. We will continue to monitor. Orders and follow up as documented in patient record.   Counseling  Sleep apnea is a condition in which breathing pauses or becomes shallow during sleep. This happens over and over during the night. This disrupts your sleep and keeps your body from getting the rest that it needs, which can cause tiredness and lack of energy (fatigue) during the day.  Sleep apnea treatment:  If you were given a device to open your airway while you sleep, USE IT!  Sleep hygiene:   Limit or avoid alcohol, caffeinated beverages, and cigarettes, especially close to bedtime.   Do not eat a large meal or eat spicy foods right before bedtime. This can lead to digestive discomfort that can make it hard for you to sleep.  Keep a sleep diary to help you and your health care provider figure out what could be causing your insomnia.  . Make your bedroom a dark, comfortable place where it is easy to fall asleep. ? Put up shades or blackout curtains to block light from outside. ? Use a white noise machine to block noise. ? Keep the temperature cool. . Limit screen use before bedtime. This includes: ? Watching TV. ? Using your smartphone, tablet, or computer. . Stick to a routine that  includes going to bed and waking up at the same times every day and night. This can help you fall asleep faster. Consider making a quiet activity, such as reading, part of your nighttime routine. . Try to avoid taking naps during the day so that you sleep better at night. . Get out of bed if you are still awake after 15 minutes of trying to sleep. Keep the lights down, but try reading or doing a quiet activity. When you feel sleepy, go back to bed.  4. Fibromyalgia Intensive lifestyle modifications are the first line treatment for this issue. We discussed several lifestyle modifications today and she will continue to work on diet, exercise and weight loss efforts.We will continue to monitor. Orders and follow up as documented in patient record.   Counseling . Try [email or web address]-[email or web address]/, which is a series of self-care modules designed to teach patients several techniques to manage pain.   5. Hypothyroidism, unspecified type Patient with long-standing hypothyroidism, on levothyroxine therapy. She appears euthyroid. Orders and follow up as documented in patient record.  Counseling . Good thyroid control is important for overall health. Supratherapeutic thyroid levels are dangerous and will not improve weight loss results. . The correct way to take levothyroxine is fasting, with water, separated by at least 30 minutes from breakfast, and separated by more than 4 hours from calcium, iron, multivitamins, acid reflux medications (PPIs).   Orders - TSH - T4, free - T3  6. Vitamin D deficiency Low Vitamin D level contributes to fatigue and are associated with obesity, breast, and colon cancer.   Orders - VITAMIN D 25 Hydroxy (Vit-D Deficiency, Fractures)  7. Fatty liver Will monitor.  8. Essential hypertension [name] is working on healthy weight loss and exercise to improve blood pressure control. We will watch for signs of hypotension as she continues her lifestyle  modifications.  9. Other hyperlipidemia Cardiovascular risk and specific lipid/LDL goals reviewed.  We discussed several lifestyle modifications today and [name] will continue to work on diet, exercise and weight loss efforts. Orders and follow up as documented in patient record.   Counseling Intensive lifestyle modifications are the first line treatment for this issue. . Dietary changes: Increase soluble fiber. Decrease simple carbohydrates. . Exercise changes: Moderate to vigorous-intensity aerobic activity 150 minutes per week if tolerated. . Lipid-lowering medications: see documented in medical record.  Orders - Lipid panel  10. Prediabetes [name] will continue to work on weight loss, exercise, and decreasing simple carbohydrates to help decrease the risk of diabetes.  - Comprehensive metabolic panel - Hemoglobin A1c - Insulin, random  11. Other depression, with emotional eating Behavior  modification techniques were discussed today to help [name] deal with her emotional/non-hunger eating behaviors.  Orders and follow up as documented in patient record.   12. Class 2 severe obesity with serious comorbidity and body mass index (BMI) of 36.0 to 36.9 in adult, unspecified obesity type (HCC) [name] is currently in the action stage of change and her goal is to continue with weight loss efforts. I recommend [name] begin the structured treatment plan as follows:  She has agreed to the Category 2 Plan.  Exercise goals: Older adults should follow the adult guidelines. When older adults cannot meet the adult guidelines, they should be as physically active as their abilities and conditions will allow.  Older adults should do exercises that maintain or improve balance if they are at risk of falling.    Behavioral modification strategies: increasing lean protein intake, decreasing simple carbohydrates, increasing vegetables, increasing water intake and decreasing liquid calories.  She was informed  of the importance of frequent follow-up visits to maximize her success with intensive lifestyle modifications for her multiple health conditions. She was informed we would discuss her lab results at her next visit unless there is a critical issue that needs to be addressed sooner. [name] agreed to keep her next visit at the agreed upon time to discuss these results.  Objective:   Blood pressure (!) 124/52, pulse 68, temperature 98 F (36.7 C), temperature source Oral, height 5\' 6"  (1.676 m), weight 223 lb (101.2 kg), SpO2 97 %. Body mass index is 35.99 kg/m.  EKG: Normal sinus rhythm, rate 69 bpm.  Indirect Calorimeter completed today shows a VO2 of 250 and a REE of 1739.  Her calculated basal metabolic rate is [id number] thus her basal metabolic rate is better than expected.  General: Cooperative, alert, well developed, in no acute distress. HEENT: Conjunctivae and lids unremarkable. Cardiovascular: Regular rhythm.  Lungs: Normal work of breathing. Neurologic: No focal deficits.   Lab Results  Component Value Date   CREATININE 0.85 03/17/2019   BUN 10 03/17/2019   NA 140 03/17/2019   K 4.7 03/17/2019   CL 102 03/17/2019   CO2 24 03/17/2019   Lab Results  Component Value Date   ALT 42 (H) 05/05/2019   AST 34 05/05/2019   ALKPHOS 94 05/05/2019   BILITOT 0.4 05/05/2019   Lab Results  Component Value Date   HGBA1C 5.4 12/04/2018   HGBA1C 5.6 11/27/2017   HGBA1C 5.6 04/12/2017   HGBA1C 5.5 11/26/2016   HGBA1C 5.5 11/15/2015   Lab Results  Component Value Date   TSH 4.05 12/04/2018   Lab Results  Component Value Date   CHOL 170 12/04/2018   HDL 44.00 12/04/2018   LDLCALC 91 12/04/2018   LDLDIRECT 141.8 07/04/2007   TRIG 175.0 (H) 12/04/2018   CHOLHDL 4 12/04/2018   Lab Results  Component Value Date   WBC 4.5 03/17/2019   HGB 13.0 03/17/2019   HCT 39.4 03/17/2019   MCV 87 03/17/2019   PLT 210 03/17/2019   Obesity Behavioral Intervention Visit Documentation for  Insurance:   Approximately 15 minutes were spent on the discussion below.  ASK: We discussed the diagnosis of obesity with [name] today and Roselani agreed to give [place] permission to discuss obesity behavioral modification therapy today.  ASSESS: Benelli has the diagnosis of obesity and her BMI today is 36.1. Lanie is in the action stage of change.   ADVISE: Sharra was educated on the multiple health risks of obesity as well as the benefit  of weight loss to improve her health. She was advised of the need for long term treatment and the importance of lifestyle modifications to improve her current health and to decrease her risk of future health problems.  AGREE: Multiple dietary modification options and treatment options were discussed and Reha agreed to follow the recommendations documented in the above note.  ARRANGE: Ashlea was educated on the importance of frequent visits to treat obesity as outlined per CMS and USPSTF guidelines and agreed to schedule her next follow up appointment today.  Attestation Statements:   This is the patient's first visit at [facility]. The patient's NEW PATIENT PACKET was reviewed at length. Included in the packet: current and past health history, medications, allergies, ROS, gynecologic history (women only), surgical history, family history, social history, weight history, weight loss surgery history (for those that have had weight loss surgery), nutritional evaluation, mood and food questionnaire, PHQ9, Epworth questionnaire, sleep habits questionnaire, patient life and health improvement goals questionnaire. These will all be scanned into the patient's chart under media.   During the visit, I independently reviewed the patient's EKG, bioimpedance scale results, and indirect calorimeter results. I used this information to tailor a meal plan for the patient that will help her to lose weight and will improve her obesity-related conditions going forward.  I performed a medically necessary appropriate examination and/or evaluation. I discussed the assessment and treatment plan with the patient. The patient was provided an opportunity to ask questions and all were answered. The patient agreed with the plan and demonstrated an understanding of the instructions. Labs were ordered at this visit and will be reviewed at the next visit unless more critical results need to be addressed immediately. Clinical information was updated and documented in the EMR.   Time spent on visit including pre-visit chart review and post-visit care was 60 minutes.   A separate 15 minutes was spent on risk counseling (see above).   I, Water quality scientist, CMA, am acting as Location manager for [employer], DO.  I have reviewed the above documentation for accuracy and completeness, and I agree with the above. Briscoe Deutscher, DO

## 2019-10-02 LAB — ANEMIA PANEL
Ferritin: 178 ng/mL — ABNORMAL HIGH (ref 15–150)
Folate, Hemolysate: 387 ng/mL
Folate, RBC: 656 ng/mL (ref >498)
Hematocrit: 59 % — ABNORMAL HIGH (ref 34.0–46.6)
Iron Saturation: 27 % (ref 15–55)
Iron: 89 ug/dL (ref 27–139)
Retic Ct Pct: 2.4 % (ref 0.6–2.6)
Total Iron Binding Capacity: 335 ug/dL (ref 250–450)
UIBC: 246 ug/dL (ref 118–369)
Vitamin B-12: 483 pg/mL (ref 232–1245)

## 2019-10-02 LAB — COMPREHENSIVE METABOLIC PANEL
ALT: 63 IU/L — ABNORMAL HIGH (ref 0–32)
AST: 69 IU/L — ABNORMAL HIGH (ref 0–40)
Albumin/Globulin Ratio: 1.4 (ref 1.2–2.2)
Albumin: 4.1 g/dL (ref 3.7–4.7)
Alkaline Phosphatase: 133 IU/L — ABNORMAL HIGH (ref 39–117)
BUN/Creatinine Ratio: 12 (ref 12–28)
BUN: 10 mg/dL (ref 8–27)
Bilirubin Total: 0.3 mg/dL (ref 0.0–1.2)
CO2: 23 mmol/L (ref 20–29)
Calcium: 9.7 mg/dL (ref 8.7–10.3)
Chloride: 104 mmol/L (ref 96–106)
Creatinine, Ser: 0.83 mg/dL (ref 0.57–1.00)
GFR calc Af Amer: 82 mL/min/{1.73_m2} (ref >59)
GFR calc non Af Amer: 71 mL/min/{1.73_m2} (ref >59)
Globulin, Total: 3 g/dL (ref 1.5–4.5)
Glucose: 110 mg/dL — ABNORMAL HIGH (ref 65–99)
Potassium: 4 mmol/L (ref 3.5–5.2)
Sodium: 140 mmol/L (ref 134–144)
Total Protein: 7.1 g/dL (ref 6.0–8.5)

## 2019-10-02 LAB — CBC WITH DIFFERENTIAL/PLATELET
Basophils Absolute: 0.1 10*3/uL (ref 0.0–0.2)
Basos: 1 %
EOS (ABSOLUTE): 0.3 10*3/uL (ref 0.0–0.4)
Eos: 7 %
Hematocrit: 40.7 % (ref 34.0–46.6)
Hemoglobin: 13.3 g/dL (ref 11.1–15.9)
Immature Grans (Abs): 0 10*3/uL (ref 0.0–0.1)
Immature Granulocytes: 0 %
Lymphocytes Absolute: 1 10*3/uL (ref 0.7–3.1)
Lymphs: 22 %
MCH: 30 pg (ref 26.6–33.0)
MCHC: 32.7 g/dL (ref 31.5–35.7)
MCV: 92 fL (ref 79–97)
Monocytes Absolute: 0.5 10*3/uL (ref 0.1–0.9)
Monocytes: 10 %
Neutrophils Absolute: 2.8 10*3/uL (ref 1.4–7.0)
Neutrophils: 60 %
Platelets: 212 10*3/uL (ref 150–450)
RBC: 4.43 x10E6/uL (ref 3.77–5.28)
RDW: 11.8 % (ref 11.7–15.4)
WBC: 4.7 10*3/uL (ref 3.4–10.8)

## 2019-10-02 LAB — T4, FREE: Free T4: 1.28 ng/dL (ref 0.82–1.77)

## 2019-10-02 LAB — TSH: TSH: 2.78 u[IU]/mL (ref 0.450–4.500)

## 2019-10-02 LAB — LIPID PANEL
Chol/HDL Ratio: 3.6 ratio (ref 0.0–4.4)
Cholesterol, Total: 171 mg/dL (ref 100–199)
HDL: 47 mg/dL (ref >39)
LDL Chol Calc (NIH): 97 mg/dL (ref 0–99)
Triglycerides: 156 mg/dL — ABNORMAL HIGH (ref 0–149)
VLDL Cholesterol Cal: 27 mg/dL (ref 5–40)

## 2019-10-02 LAB — HEMOGLOBIN A1C
Est. average glucose Bld gHb Est-mCnc: 117 mg/dL
Hgb A1c MFr Bld: 5.7 % — ABNORMAL HIGH (ref 4.8–5.6)

## 2019-10-02 LAB — T3: T3, Total: 100 ng/dL (ref 71–180)

## 2019-10-02 LAB — INSULIN, RANDOM: INSULIN: 15.6 u[IU]/mL (ref 2.6–24.9)

## 2019-10-02 LAB — VITAMIN D 25 HYDROXY (VIT D DEFICIENCY, FRACTURES): Vit D, 25-Hydroxy: 50.9 ng/mL (ref 30.0–100.0)

## 2019-10-05 ENCOUNTER — Encounter: Payer: Self-pay | Admitting: Neurology

## 2019-10-05 ENCOUNTER — Ambulatory Visit (INDEPENDENT_AMBULATORY_CARE_PROVIDER_SITE_OTHER): Payer: Medicare Other | Admitting: Neurology

## 2019-10-05 ENCOUNTER — Other Ambulatory Visit: Payer: Self-pay

## 2019-10-05 VITALS — BP 124/68 | HR 69 | Temp 96.8°F | Ht 67.0 in | Wt 227.0 lb

## 2019-10-05 DIAGNOSIS — M797 Fibromyalgia: Secondary | ICD-10-CM

## 2019-10-05 DIAGNOSIS — M542 Cervicalgia: Secondary | ICD-10-CM

## 2019-10-05 NOTE — Progress Notes (Signed)
[name]: [name] DOB: 1950/09/27  REASON FOR VISIT: follow up HISTORY FROM: [name]  HISTORY OF PRESENT ILLNESS: Today 10/05/19  [name] is a 69 year old female with history of fibromyalgia, chronic neck pain, and paresthesias in her left hand.  She received a cervical steroid injection in November 2020. She is followed by Dr. Estanislado Pandy for fibromyalgia. She is given Tramadol for neck pain related to fibromyalgia. For her neck pain CT scan of cervical spine suggested spondylosis that could lead to left C5 nerve root impingement.  EMG and nerve conduction studies done previously did not show evidence of radiculopathy.  She says she is overall doing great.  The upper injection was very helpful for her neck pain, she no longer has paresthesias down the left arm.  She completed physical therapy, that has taught her exercises for her left shoulder, neck, and back pain. This is helpful.  She is no longer taking gabapentin.  She is considering referral for pain management clinic.  Overall, she is doing well, is pleased.   HISTORY 07/08/2019 SS: [name] is a 69 year old female with history of fibromyalgia, chronic neck pain, and paresthesias in her left hand.  She was sent for a cervical steroid epidural injection on 06/26/2019.  She indicates her upper back pain is much better since the injection.  She is now complaining of left shoulder pain and swelling and soreness to the base of the left side of her neck.  She says she has noticed it since the injection.  She does report it may have been there prior to the injection, but her neck pain was so significant she may have ignored it.  She denies dizziness, chest pain, headache, or slurred speech.  She has continued to complain of some chronic intermittent tingling in her left fingertips.  This sensation has not been significant enough to take any medications.  She has been alternating ice and heat.  It does not hurt to lift her arm.  She notices the  sensation is worse when she lies down and moves around.  She did contact her rheumatologist who has ordered physical therapy, which she has yet to begin.  She presents today for follow-up via virtual visit   REVIEW OF SYSTEMS: Out of a complete 14 system review of symptoms, the [name] complains only of the following symptoms, and all other reviewed systems are negative.  Neck pain  ALLERGIES: Allergies  Allergen Reactions  . Ace Inhibitors Swelling    Angioedema; makes tongue "break out"   . Amitriptyline Hcl Other (See Comments)     makes her too sleepy!  . Atorvastatin Other (See Comments)    SEVERE MYALGIA  . Cymbalta [Duloxetine Hcl] Nausea Only and Other (See Comments)    Sleepiness/ sick  . Dilantin [Phenytoin Sodium Extended] Rash    Severe rash  . Paroxetine Nausea Only    Rapid heartbeat  . Ramipril Other (See Comments)    TONGUE ULCERS   . Rosuvastatin Other (See Comments)    SEVERE MYALGIA  . Carbamazepine Rash  . Codeine Itching  . Iodine-131 Other (See Comments)    Swelling at IV site only, no swelling or SOB around mouth.!  . Phenytoin Sodium Extended Rash  . Wellbutrin [Bupropion] Palpitations    HOME MEDICATIONS: Outpatient Medications Prior to Visit  Medication Sig Dispense Refill  . albuterol (PROVENTIL) (2.5 MG/3ML) 0.083% nebulizer solution USE 1 VIAL IN NEBULIZER EVERY 6 HOURS - and as needed 75 mL 11  .  albuterol (VENTOLIN HFA) 108 (90 Base) MCG/ACT inhaler Inhale 2 puffs into the lungs every 6 (six) hours as needed for wheezing or shortness of breath. 18 g 3  . Ascorbic Acid (VITAMIN C) 1000 MG tablet Take 1,000 mg by mouth daily.    [name] aspirin 81 MG tablet Take 81 mg by mouth daily.    . baclofen (LIORESAL) 10 MG tablet TAKE 1 TABLET THREE TIMES DAILY ([name] taking differently: 10 mg. ) 270 tablet 0  . calcium carbonate (OS-CAL - DOSED IN MG OF ELEMENTAL CALCIUM) 1250 (500 Ca) MG tablet Take 1 tablet by mouth.    . carvedilol (COREG) 12.5 MG  tablet TAKE 1 TABLET TWICE DAILY 180 tablet 1  . cholecalciferol (VITAMIN D) 1000 UNITS tablet Take 5,000 Units by mouth daily.     . diclofenac (FLECTOR) 1.3 % PTCH Place 1 patch onto the skin 2 (two) times daily. 30 patch 0  . fexofenadine (ALLEGRA) 180 MG tablet Take 180 mg by mouth daily as needed for allergies or rhinitis.    . fluticasone (FLONASE) 50 MCG/ACT nasal spray Place 2 sprays into both nostrils daily as needed for allergies or rhinitis. 48 g 3  . furosemide (LASIX) 40 MG tablet Take 1 tablet (40 mg total) by mouth every other day. ([name] taking differently: Take 20 mg by mouth daily. ) 45 tablet 3  . hydroxypropyl methylcellulose / hypromellose (ISOPTO TEARS / GONIOVISC) 2.5 % ophthalmic solution Place 1 drop into both eyes daily.    [name] latanoprost (XALATAN) 0.005 % ophthalmic solution Place 1 drop into both eyes at bedtime.    [name] levothyroxine (SYNTHROID) 50 MCG tablet Take 1 tablet (50 mcg total) by mouth daily. 90 tablet 3  . Magnesium Oxide 400 (240 Mg) MG TABS Take 1 tablet (400 mg total) by mouth daily. 90 tablet 3  . mometasone (ELOCON) 0.1 % lotion Apply 1 application topically daily as needed (rash).     . montelukast (SINGULAIR) 10 MG tablet TAKE 1 TABLET AT BEDTIME 90 tablet 3  . Multiple Vitamins-Minerals (MULTIVITAMIN PO) Take 1 tablet by mouth daily.    . pantoprazole (PROTONIX) 40 MG tablet Take 1 tablet (40 mg total) by mouth daily. 90 tablet 1  . polyethylene glycol powder (GLYCOLAX/MIRALAX) powder Take 17 grams by mouth daily 1080 g 0  . potassium chloride (K-DUR) 10 MEQ tablet TAKE 1/2 TABLET (5MEQ TOTAL) EVERY DAY 45 tablet 3  . Respiratory Therapy Supplies (FLUTTER) DEVI 1 Device by Does not apply route as needed. 1 each 0  . simvastatin (ZOCOR) 40 MG tablet TAKE 1/2 TABLET (20MG ) EVERY DAY 45 tablet 2  . sodium chloride HYPERTONIC 3 % nebulizer solution Take by nebulization 2 (two) times daily as needed for cough. 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 750 mL 12  . traMADol  (ULTRAM) 50 MG tablet Take 1 tablet (50 mg total) by mouth 3 (three) times daily as needed. For neck pain. 21 tablet 0  . zinc gluconate 50 MG tablet Take 50 mg by mouth daily.    [name] zolpidem (AMBIEN) 10 MG tablet TAKE 1 TABLET BY MOUTH AT BEDTIME AS NEEDED FOR SLEEP 90 tablet 1  . gabapentin (NEURONTIN) 300 MG capsule Take 1 capsule (300 mg total) by mouth 2 (two) times daily. 60 capsule 3   No facility-administered medications prior to visit.    PAST MEDICAL HISTORY: Past Medical History:  Diagnosis Date  . AICD (automatic cardioverter/defibrillator) present   . Anxiety   . Arthritis   .  Back pain   . 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 Bronchiectasis ([place])   . Cerebral aneurysm, nonruptured    had a clip put in  . CHF (congestive heart failure) ([place])   . Clostridium difficile infection   . Depressive disorder, not elsewhere classified   . Diverticulosis of colon (without mention of hemorrhage)   . Esophageal reflux   . Fatty liver   . Fibromyalgia   . Gastritis   . GERD (gastroesophageal reflux disease)   . GI bleed 2004  . Glaucoma   . Hiatal hernia   . Hyperlipidemia   . Hypertension   . Hypothyroidism   . Internal hemorrhoids   . Joint pain   . Obstructive sleep apnea (adult) (pediatric)   . Osteoarthritis   . Ostium secundum type atrial septal defect   . Other chronic nonalcoholic liver disease   . Other pulmonary embolism and infarction   . Palpitations   . Paroxysmal ventricular tachycardia ([place])   . Personal history of radiation therapy   . Pneumonia    history  . Presence of permanent cardiac pacemaker   . PUD (peptic ulcer disease)   . Radiation 02/03/15-03/10/15   Right Breast  . Sarcoid    per pt , not sure  . Schatzki's ring   . Shortness of breath   . Sleep apnea   . Stroke [facility]) 2013   tia/ pt feels it was around 2008 0r 2009  . Takotsubo syndrome   . Tubular adenoma of colon   . Unspecified transient cerebral  ischemia   . Unspecified vitamin D deficiency     PAST SURGICAL HISTORY: Past Surgical History:  Procedure Laterality Date  . ABI  2006   normal  . BREAST EXCISIONAL BIOPSY    . BREAST LUMPECTOMY WITH RADIOACTIVE SEED LOCALIZATION Right 01/03/2015   Procedure: BREAST LUMPECTOMY WITH RADIOACTIVE SEED LOCALIZATION;  Surgeon: Autumn Messing III, MD;  Location: [facility];  Service: General;  Laterality: Right;  . CARDIAC DEFIBRILLATOR PLACEMENT  2006; 2012   BSX single chamber ICD  . carotid dopplers  2006   neg  . CEREBRAL ANEURYSM REPAIR  02/1999  . COLONOSCOPY    . hospitalization  2004   GI bleed, PUD, diverticulosis (EGD,colonscopy)  . hospitalization     PE, NSVT, s/p defib  . KNEE ARTHROSCOPY     bilateral  . LEFT HEART CATHETERIZATION WITH CORONARY ANGIOGRAM N/A 02/22/2012   Procedure: LEFT HEART CATHETERIZATION WITH CORONARY ANGIOGRAM;  Surgeon: Burnell Blanks, MD;  Location: [facility] CATH LAB;  Service: Cardiovascular;  Laterality: N/A;  . PARTIAL HYSTERECTOMY     Fibroids  . TONGUE BIOPSY  12/12/2017   due to sore tongue and white patches/abnormal cells  . TOTAL KNEE ARTHROPLASTY Left 02/13/2016   Procedure: TOTAL KNEE ARTHROPLASTY;  Surgeon: Vickey Huger, MD;  Location: [facility];  Service: Orthopedics;  Laterality: Left;  . UPPER GASTROINTESTINAL ENDOSCOPY      FAMILY HISTORY: Family History  Problem Relation Age of Onset  . Diabetes Mother   . Alzheimer's disease Mother   . Hypertension Mother   . Obesity Mother   . Other Brother        Thyroid problem 10/2016  . Allergies Sister   . Heart disease Sister   . Heart disease Brother   . Prostate cancer Brother 6       same brother as throat cancer  . Throat cancer Brother  dx in his 54s; also a smoker  . Heart disease Father   . Cancer Maternal Grandmother 78       colon cancer or abdominal cancer  . Lung cancer Maternal Grandfather 11  . Breast cancer Maternal Aunt 31  . Colon cancer Maternal Aunt 17       same  sister as breast at 42  . Lung cancer Maternal Uncle   . Breast cancer Maternal Aunt        dx in her 30s  . Pancreatic cancer Cousin 71       maternal first cousin  . Breast cancer Cousin        paternal first cousin twice removed died in her 50s  . Cardiomyopathy Other        Family history  . Heart disease Son        Cardiac Arrest 07/2016  . Stroke Neg Hx     SOCIAL HISTORY: Social History   Socioeconomic History  . Marital status: Divorced    Spouse name: Not on file  . Number of children: 2  . Years of education: 58  . Highest education level: Not on file  Occupational History  . Occupation: DISABLED  Tobacco Use  . Smoking status: Former Smoker    Packs/day: 1.00    Years: 15.00    Pack years: 15.00    Types: Cigarettes    Quit date: 08/20/1989    Years since quitting: 30.1  . Smokeless tobacco: Never Used  . Tobacco comment: Started at 56; less than 1 PPD  Substance and Sexual Activity  . Alcohol use: Yes    Alcohol/week: 0.0 standard drinks    Comment: rare  . Drug use: No  . Sexual activity: Not Currently  Other Topics Concern  . Not on file  Social History Narrative   Lives alone   Caffeine [id number]   Retired from [employer]   2 children   Divorced   Right handed    Social Determinants of Health   Financial Resource Strain:   . Difficulty of Paying Living Expenses: Not on file  Food Insecurity:   . Worried About Charity fundraiser in the Last Year: Not on file  . Ran Out of Food in the Last Year: Not on file  Transportation Needs:   . Lack of Transportation (Medical): Not on file  . Lack of Transportation (Non-Medical): Not on file  Physical Activity:   . Days of Exercise per Week: Not on file  . Minutes of Exercise per Session: Not on file  Stress:   . Feeling of Stress : Not on file  Social Connections:   . Frequency of Communication with Friends and Family: Not on file  . Frequency of Social Gatherings with Friends and Family: Not on  file  . Attends Religious Services: Not on file  . Active Member of Clubs or Organizations: Not on file  . Attends Archivist Meetings: Not on file  . Marital Status: Not on file  Intimate Partner Violence:   . Fear of Current or Ex-Partner: Not on file  . Emotionally Abused: Not on file  . Physically Abused: Not on file  . Sexually Abused: Not on file   PHYSICAL EXAM  Vitals:   10/05/19 1352  BP: 124/68  Pulse: 69  Temp: (!) 96.8 F (36 C)  Weight: 227 lb (103 kg)  Height: 5\' 7"  (1.702 m)   Body mass index is 35.55 kg/m.  Generalized:  Well developed, in no acute distress   Neurological examination  Mentation: Alert oriented to time, place, history taking. Follows all commands speech and language fluent Cranial nerve II-XII: Pupils were equal round reactive to light. Extraocular movements were full, visual field were full on confrontational test. Facial sensation and strength were normal. Head turning and shoulder shrug  were normal and symmetric. Motor: Good strength of all extremities Sensory: Sensory testing is intact to soft touch on all 4 extremities. No evidence of extinction is noted.  Coordination: Cerebellar testing reveals good finger-nose-finger and heel-to-shin bilaterally.  Gait and station: Gait is cautious. Tandem gait is slightly unsteady. Romberg is negative. No drift is seen.  Reflexes: Deep tendon reflexes are symmetric.   DIAGNOSTIC DATA (LABS, IMAGING, TESTING) - I reviewed [name] records, labs, notes, testing and imaging myself where available.  Lab Results  Component Value Date   WBC 4.7 10/01/2019   HGB 13.3 10/01/2019   HCT 59.0 (H) 10/01/2019   MCV 92 10/01/2019   PLT 212 10/01/2019      Component Value Date/Time   NA 140 10/01/2019 1408   NA 141 12/01/2014 0847   K 4.0 10/01/2019 1408   K 3.8 12/01/2014 0847   CL 104 10/01/2019 1408   CO2 23 10/01/2019 1408   CO2 26 12/01/2014 0847   GLUCOSE 110 (H) 10/01/2019 1408    GLUCOSE 101 (H) 12/04/2018 1240   GLUCOSE 175 (H) 12/01/2014 0847   BUN 10 10/01/2019 1408   BUN 10.6 12/01/2014 0847   CREATININE 0.83 10/01/2019 1408   CREATININE 0.82 02/27/2017 1518   CREATININE 0.8 12/01/2014 0847   CALCIUM 9.7 10/01/2019 1408   CALCIUM 9.8 12/01/2014 0847   PROT 7.1 10/01/2019 1408   PROT 7.5 12/01/2014 0847   ALBUMIN 4.1 10/01/2019 1408   ALBUMIN 3.9 12/01/2014 0847   AST 69 (H) 10/01/2019 1408   AST 29 12/01/2014 0847   ALT 63 (H) 10/01/2019 1408   ALT 33 12/01/2014 0847   ALKPHOS 133 (H) 10/01/2019 1408   ALKPHOS 127 12/01/2014 0847   BILITOT 0.3 10/01/2019 1408   BILITOT 0.42 12/01/2014 0847   GFRNONAA 71 10/01/2019 1408   GFRNONAA 74 02/27/2017 1518   GFRAA 82 10/01/2019 1408   GFRAA 85 02/27/2017 1518   Lab Results  Component Value Date   CHOL 171 10/01/2019   HDL 47 10/01/2019   LDLCALC 97 10/01/2019   LDLDIRECT 141.8 07/04/2007   TRIG 156 (H) 10/01/2019   CHOLHDL 3.6 10/01/2019   Lab Results  Component Value Date   HGBA1C 5.7 (H) 10/01/2019   Lab Results  Component Value Date   VITAMINB12 483 10/01/2019   Lab Results  Component Value Date   TSH 2.780 10/01/2019      ASSESSMENT AND PLAN 69 y.o. year old female  has a past medical history of AICD (automatic cardioverter/defibrillator) present, Anxiety, Arthritis, Back pain, Breast cancer ([place]) (2016), Bronchiectasis ([place]), Cerebral aneurysm, nonruptured, CHF (congestive heart failure) ([place]), Clostridium difficile infection, Depressive disorder, not elsewhere classified, Diverticulosis of colon (without mention of hemorrhage), Esophageal reflux, Fatty liver, Fibromyalgia, Gastritis, GERD (gastroesophageal reflux disease), GI bleed (2004), Glaucoma, Hiatal hernia, Hyperlipidemia, Hypertension, Hypothyroidism, Internal hemorrhoids, Joint pain, Obstructive sleep apnea (adult) (pediatric), Osteoarthritis, Ostium secundum type atrial septal defect, Other chronic nonalcoholic liver disease,  Other pulmonary embolism and infarction, Palpitations, Paroxysmal ventricular tachycardia ([place]), Personal history of radiation therapy, Pneumonia, Presence of permanent cardiac pacemaker, PUD (peptic ulcer disease), Radiation (02/03/15-03/10/15), Sarcoid, Schatzki's ring, Shortness of breath, Sleep  apnea, Stroke ([place]) (2013), Takotsubo syndrome, Tubular adenoma of colon, Unspecified transient cerebral ischemia, and Unspecified vitamin D deficiency. here with:  1.  Fibromyalgia 2.  Neck pain 3.  Paresthesia  She has been doing very well since the cervical epidural steroid injection in November 2020.  She is no longer taking gabapentin.  She indicates her neck pain is under good control, no longer has paresthesia down the left arm.  Previous CT scan of the cervical spine suggested spondylosis that could lead to left C5 nerve root impingement.  Nerve conduction studies done previously did not show evidence of radiculopathy.  She is doing daily physical therapy exercises that have been helpful.  She is considering pain clinic referral for her fibromyalgia, I think this is reasonable.  She will follow-up at our office in 6 months or sooner if needed.   I spent 15 minutes with the [name]. 50% of this time was spent discussing her plan of care.   Butler Denmark, AGNP-C, DNP 10/05/2019, 2:13 PM [facility] [address], [place], [place] [address] [phone]

## 2019-10-05 NOTE — Patient Instructions (Signed)
Continue follow-up with your doctors, see [place] as needed.

## 2019-10-05 NOTE — Progress Notes (Signed)
I have read the note, and I agree with the clinical assessment and plan.  Senon Nixon K Maudy Yonan   

## 2019-10-13 ENCOUNTER — Ambulatory Visit: Payer: Medicare Other | Admitting: Pulmonary Disease

## 2019-10-15 ENCOUNTER — Other Ambulatory Visit: Payer: Self-pay

## 2019-10-15 ENCOUNTER — Ambulatory Visit (INDEPENDENT_AMBULATORY_CARE_PROVIDER_SITE_OTHER): Payer: Medicare Other | Admitting: Family Medicine

## 2019-10-15 ENCOUNTER — Encounter (INDEPENDENT_AMBULATORY_CARE_PROVIDER_SITE_OTHER): Payer: Self-pay | Admitting: Family Medicine

## 2019-10-15 VITALS — BP 120/52 | HR 66 | Temp 98.5°F | Ht 66.0 in | Wt 221.0 lb

## 2019-10-15 DIAGNOSIS — K760 Fatty (change of) liver, not elsewhere classified: Secondary | ICD-10-CM

## 2019-10-15 DIAGNOSIS — Z6835 Body mass index (BMI) 35.0-35.9, adult: Secondary | ICD-10-CM

## 2019-10-15 DIAGNOSIS — E781 Pure hyperglyceridemia: Secondary | ICD-10-CM

## 2019-10-15 DIAGNOSIS — M25561 Pain in right knee: Secondary | ICD-10-CM

## 2019-10-15 DIAGNOSIS — R7303 Prediabetes: Secondary | ICD-10-CM

## 2019-10-15 DIAGNOSIS — R7989 Other specified abnormal findings of blood chemistry: Secondary | ICD-10-CM | POA: Diagnosis not present

## 2019-10-15 DIAGNOSIS — G8929 Other chronic pain: Secondary | ICD-10-CM

## 2019-10-15 MED ORDER — METFORMIN HCL 500 MG PO TABS: 500.0000 mg | ORAL_TABLET | Freq: Every day | ORAL | 0 refills | Status: DC

## 2019-10-15 NOTE — Progress Notes (Signed)
Chief Complaint:   OBESITY [name] is here to discuss her progress with her obesity treatment plan along with follow-up of her obesity related diagnoses. [name] is on the Category 2 Plan and states she is following her eating plan approximately 60% of the time. [name] states she is doing some yard work, back exercises for 10-15 minutes 3 times per week.  Today's visit was #: 2 Starting weight: 223 lbs Starting date: 10/01/2019 Today's weight: 221 lbs Today's date: 10/15/2019 Total lbs lost to date: 2 lbs Total lbs lost since last in-office visit: 2 lbs  Interim History: [name] reports that for breakfast she is having 2 eggs, toast, and coffee with no cream.  For lunch she has a salad.  For dinner she will have salmon or steak and vegetables.  She says that she sometimes skips breakfast.  She would like to have more variety.  She is ready to journal.  Subjective:   1. Prediabetes [name] has a diagnosis of prediabetes based on her elevated HgA1c and was informed this puts her at greater risk of developing diabetes. She continues to work on diet and exercise to decrease her risk of diabetes. She denies nausea or hypoglycemia.  Lab Results  Component Value Date   HGBA1C 5.7 (H) 10/01/2019   Lab Results  Component Value Date   INSULIN 15.6 10/01/2019   2. Fatty liver [name] has fatty liver disease.  3. Elevated LFTs [name] has known fatty liver and prediabetes.  4. Pure hypertriglyceridemia [name] has hyperlipidemia and has been trying to improve her cholesterol levels with intensive lifestyle modification including a low saturated fat diet, exercise and weight loss. She denies any chest pain, claudication or myalgias.  She is taking Zocor.  Lab Results  Component Value Date   ALT 63 (H) 10/01/2019   AST 69 (H) 10/01/2019   ALKPHOS 133 (H) 10/01/2019   BILITOT 0.3 10/01/2019   Lab Results  Component Value Date   CHOL 171 10/01/2019   HDL 47 10/01/2019   LDLCALC 97 10/01/2019     LDLDIRECT 141.8 07/04/2007   TRIG 156 (H) 10/01/2019   CHOLHDL 3.6 10/01/2019   5. Chronic pain of right knee [name] has chronic pain of the right knee.  Assessment/Plan:   1. Prediabetes [name] will continue to work on weight loss, exercise, and decreasing simple carbohydrates to help decrease the risk of diabetes.  Will start metformin 500 mg daily.  2. Fatty liver Will monitor.  3. Elevated LFTs Will continue to monitor.  4. Pure hypertriglyceridemia Cardiovascular risk and specific lipid/LDL goals reviewed.  We discussed several lifestyle modifications today and [name] will continue to work on diet, exercise and weight loss efforts. Orders and follow up as documented in patient record.   Counseling Intensive lifestyle modifications are the first line treatment for this issue. . Dietary changes: Increase soluble fiber. Decrease simple carbohydrates. . Exercise changes: Moderate to vigorous-intensity aerobic activity 150 minutes per week if tolerated. . Lipid-lowering medications: see documented in medical record.  5. Chronic pain of right knee Will discuss options for exercise at a future visit.  6. Class 2 severe obesity with serious comorbidity and body mass index (BMI) of 35.0 to 35.9 in adult, unspecified obesity type (HCC) [name] is currently in the action stage of change. As such, her goal is to continue with weight loss efforts. She has agreed to keeping a food journal and adhering to recommended goals of 1200-1300 calories and 85 grams of protein.  Exercise goals: No exercise has been prescribed at this time.  Behavioral modification strategies: increasing lean protein intake, increasing water intake and meal planning and cooking strategies.  [name] has agreed to follow-up with our clinic in 2 weeks. She was informed of the importance of frequent follow-up visits to maximize her success with intensive lifestyle modifications for her multiple health conditions.    Objective:   Blood pressure (!) 120/52, pulse 66, temperature 98.5 F (36.9 C), temperature source Oral, height 5\' 6"  (1.676 m), weight 221 lb (100.2 kg), SpO2 96 %. Body mass index is 35.67 kg/m.  General: Cooperative, alert, well developed, in no acute distress. HEENT: Conjunctivae and lids unremarkable. Cardiovascular: Regular rhythm.  Lungs: Normal work of breathing. Neurologic: No focal deficits.   Lab Results  Component Value Date   CREATININE 0.83 10/01/2019   BUN 10 10/01/2019   NA 140 10/01/2019   K 4.0 10/01/2019   CL 104 10/01/2019   CO2 23 10/01/2019   Lab Results  Component Value Date   ALT 63 (H) 10/01/2019   AST 69 (H) 10/01/2019   ALKPHOS 133 (H) 10/01/2019   BILITOT 0.3 10/01/2019   Lab Results  Component Value Date   HGBA1C 5.7 (H) 10/01/2019   HGBA1C 5.4 12/04/2018   HGBA1C 5.6 11/27/2017   HGBA1C 5.6 04/12/2017   HGBA1C 5.5 11/26/2016   Lab Results  Component Value Date   INSULIN 15.6 10/01/2019   Lab Results  Component Value Date   TSH 2.780 10/01/2019   Lab Results  Component Value Date   CHOL 171 10/01/2019   HDL 47 10/01/2019   LDLCALC 97 10/01/2019   LDLDIRECT 141.8 07/04/2007   TRIG 156 (H) 10/01/2019   CHOLHDL 3.6 10/01/2019   Lab Results  Component Value Date   WBC 4.7 10/01/2019   HGB 13.3 10/01/2019   HCT 59.0 (H) 10/01/2019   MCV 92 10/01/2019   PLT 212 10/01/2019   Lab Results  Component Value Date   IRON 89 10/01/2019   TIBC 335 10/01/2019   FERRITIN 178 (H) 10/01/2019   Obesity Behavioral Intervention:   Approximately 15 minutes were spent on the discussion below.  ASK: We discussed the diagnosis of obesity with [name] today and [name] agreed to give [place] permission to discuss obesity behavioral modification therapy today.  ASSESS: [name] has the diagnosis of obesity and her BMI today is 35.8. [name] is in the action stage of change.   ADVISE: [name] was educated on the multiple health risks of obesity as  well as the benefit of weight loss to improve her health. She was advised of the need for long term treatment and the importance of lifestyle modifications to improve her current health and to decrease her risk of future health problems.  AGREE: Multiple dietary modification options and treatment options were discussed and Loretta agreed to follow the recommendations documented in the above note.  ARRANGE: Jamel was educated on the importance of frequent visits to treat obesity as outlined per CMS and USPSTF guidelines and agreed to schedule her next follow up appointment today.  Attestation Statements:   Reviewed by clinician on day of visit: allergies, medications, problem list, medical history, surgical history, family history, social history, and previous encounter notes.  I, Water quality scientist, CMA, am acting as Location manager for [employer], DO.  I have reviewed the above documentation for accuracy and completeness, and I agree with the above. Briscoe Deutscher, DO

## 2019-10-16 ENCOUNTER — Ambulatory Visit: Payer: Medicare Other

## 2019-10-23 ENCOUNTER — Ambulatory Visit: Payer: Medicare Other

## 2019-10-23 ENCOUNTER — Other Ambulatory Visit: Payer: Self-pay

## 2019-10-23 ENCOUNTER — Ambulatory Visit (INDEPENDENT_AMBULATORY_CARE_PROVIDER_SITE_OTHER): Payer: Medicare Other

## 2019-10-23 ENCOUNTER — Encounter: Payer: Self-pay | Admitting: Pulmonary Disease

## 2019-10-23 ENCOUNTER — Ambulatory Visit (INDEPENDENT_AMBULATORY_CARE_PROVIDER_SITE_OTHER): Payer: Medicare Other | Admitting: Pulmonary Disease

## 2019-10-23 VITALS — BP 118/82 | HR 68 | Temp 98.5°F | Ht 67.0 in | Wt 224.0 lb

## 2019-10-23 DIAGNOSIS — J471 Bronchiectasis with (acute) exacerbation: Secondary | ICD-10-CM | POA: Insufficient documentation

## 2019-10-23 DIAGNOSIS — G4733 Obstructive sleep apnea (adult) (pediatric): Secondary | ICD-10-CM

## 2019-10-23 MED ORDER — AMOXICILLIN-POT CLAVULANATE 875-125 MG PO TABS: 1.0000 | ORAL_TABLET | Freq: Two times a day (BID) | ORAL | 0 refills | Status: DC

## 2019-10-23 NOTE — Assessment & Plan Note (Signed)
2 to 3 weeks of worsening cough and congestion Worsening right upper lobe/right back pain Pain tender to palpation Nonadherence to hypertonic saline History of bronchiectasis  Plan: Chest x-ray today Course of Augmentin Continue flutter valve use Resume hypertonic saline nebs use Keep close follow-up with our office later on this month Notify our office if you are having worsening symptoms, fever develops or symptoms not improving despite antibiotic use  If you have persistent chest pain or shortness of breath please contact our office as well as present to an emergency room/urgent care for in person evaluation

## 2019-10-23 NOTE — Patient Instructions (Addendum)
You were seen today by Lauraine Rinne, NP  for:   Thank you for coming in.  I am glad that you call this.  I am concerned that you may have an exacerbation of your bronchiectasis.  We will treat you with antibiotics today and order a chest x-ray.  If you have persistent shortness of breath, chest pain or worsening acute symptoms please contact our office.  We will also get a sputum cup to test her sputum.  Keep your appointment with Dr. Halford Chessman later on this month.  Stay safe  [name]  1. Bronchiectasis with (acute) exacerbation (HCC)  - amoxicillin-clavulanate (AUGMENTIN) 875-125 MG tablet; Take 1 tablet by mouth 2 (two) times daily.  Dispense: 20 tablet; Refill: 0 - DG Chest 2 View; Future - DG Chest 2 View; Future  Bronchiectasis: This is the medical term which indicates that you have damage, dilated airways making you more susceptible to respiratory infection. Use a flutter valve 10 breaths twice a day or 4 to 5 breaths 4-5 times a day to help clear mucus out Let us know if you have cough with change in mucus color or fevers or chills.  At that point you would need an antibiotic. Maintain a healthy nutritious diet, eating whole foods Take your medications as prescribed   Please restart using your hypertonic saline nebs twice daily use this prior to your flutter valve  Hydrate well  We will provide Mucinex samples for you  We will see you back in office later on this month  We recommend today:  Orders Placed This Encounter  Procedures  . DG Chest 2 View    Standing Status:   Future    Number of Occurrences:   1    Standing Expiration Date:   12/22/2020    Order Specific Question:   Reason for Exam (SYMPTOM  OR DIAGNOSIS REQUIRED)    Answer:   pain on right side    Order Specific Question:   Preferred imaging location?    Answer:   [facility]    Order Specific Question:   Radiology Contrast Protocol - do NOT remove file path    Answer:    [email or web address]  . DG Chest 2 View    Standing Status:   Future    Standing Expiration Date:   12/22/2020    Order Specific Question:   Reason for Exam (SYMPTOM  OR DIAGNOSIS REQUIRED)    Answer:   cough, congestion    Order Specific Question:   Preferred imaging location?    Answer:   [facility]    Order Specific Question:   Radiology Contrast Protocol - do NOT remove file path    Answer:   [email or web address]   Orders Placed This Encounter  Procedures  . DG Chest 2 View  . DG Chest 2 View   Meds ordered this encounter  Medications  . amoxicillin-clavulanate (AUGMENTIN) 875-125 MG tablet    Sig: Take 1 tablet by mouth 2 (two) times daily.    Dispense:  20 tablet    Refill:  0    Follow Up:    Return in about 3 weeks (around 11/13/2019), or if symptoms worsen or fail to improve, for Follow up with Dr. Halford Chessman.   Please do your part to reduce the spread of COVID-19:      Reduce your risk of any infection  and COVID19 by using the similar precautions used for avoiding the common cold or flu:  [name] Wash your hands often  with soap and warm water for at least 20 seconds.  If soap and water are not readily available, use an alcohol-based hand sanitizer with at least 60% alcohol.  . If coughing or sneezing, cover your mouth and nose by coughing or sneezing into the elbow areas of your shirt or coat, into a tissue or into your sleeve (not your hands). Langley Gauss A MASK when in public  . Avoid shaking hands with others and consider head nods or verbal greetings only. . Avoid touching your eyes, nose, or mouth with unwashed hands.  . Avoid close contact with people who are sick. . Avoid places or events with large numbers of people in one location, like concerts or sporting events. . If you have some symptoms but not all symptoms, continue to monitor at home and seek medical attention if your symptoms worsen. . If  you are having a medical emergency, call 911.   [place] / e-Visit: [email or web address]         [facility]: [facility]: [id number]                   [facility]: [id number]     It is flu season:   >>> Best ways to protect herself from the flu: Receive the yearly flu vaccine, practice good hand hygiene washing with soap and also using hand sanitizer when available, eat a nutritious meals, get adequate rest, hydrate appropriately   Please contact the office if your symptoms worsen or you have concerns that you are not improving.   Thank you for choosing [facility] for your healthcare, and for allowing [place] to partner with you on your healthcare journey. I am thankful to be able to provide care to you today.   Wyn Quaker FNP-C

## 2019-10-23 NOTE — Assessment & Plan Note (Signed)
Plan: Continue CPAP 

## 2019-10-23 NOTE — Progress Notes (Signed)
@Patient  ID: [name], female    DOB: June 05, 1951, 69 y.o.   MRN: [id number]  Chief Complaint  Patient presents with  . Follow-up    dull, achy pain on lower right side. Off and on for the past 2 weeks. Denies any falls. Denies any coughing spells.     Referring provider: Tower, [name], MD  HPI:  69 year old female former smoker followed in our office for obstructive sleep apnea and bronchiectasis  PMH: Hypothyroidism, depression, hypertension, GERD, fibromyalgia, patent foreman ovale, prediabetes, restless leg syndrome, history of C. difficile infection, TMJ Smoker/ Smoking History: Former smoker.  Quit 1991.  15-pack-year smoking history Maintenance:  Hypertonic Saline nebs Pt of: Dr. Halford Chessman  10/23/2019  - Visit   69 year old female former smoker followed in our office for obstructive sleep apnea and bronchiectasis.  Patient completing an acute visit with our office today.  She is noticed that over the last 2 weeks she has had increased fatigue, right chest wall pain, increased cough with congestion.  Cough is productive with discolored mucus.  She is concerned regarding the left chest wall pain.  Patient reports adherence to flutter valve.  She has not been using hypertonic saline nebs as ordered.  She is confused on how to take these.  We will review this today.  Questionaires / Pulmonary Flowsheets:   MMRC: mMRC Dyspnea Scale mMRC Score  10/23/2019 0    Epworth:  Results of the Epworth flowsheet 08/04/2019  Sitting and reading 2  Watching TV 2  Sitting, inactive in a public place (e.g. a theatre or a meeting) 3  As a passenger in a car for an hour without a break 2  Lying down to rest in the afternoon when circumstances permit 2  Sitting and talking to someone 2  Sitting quietly after a lunch without alcohol 2  In a car, while stopped for a few minutes in traffic 1  Total score 16    Tests:   Pulmonary tests:  PFT 04/17/12 >> FEV1 2.60 (110%), FEV1% 85, TLC 4.05  (77%), DLCO 44% PFT 01/17/17 >> FEV1 2.38 (111%), FEV1% 90, TLC 3.85 (71%), DLCO 58%, no BD RAST 05/30/17 >> dust mites PFT 10/16/18 >> FEV1 2.27 (111%), FEV1% 90, TLC 4.00 (74%), DLCO 59%  Serology:  04/02/12 >> ACE 1 10/25/15 >> ESR 40, ACE 32 05/30/17 >> IgA, IgG, IgM normal 10/16/18 >> HP panel negative, ACE 25  Chest imaging:  CT chest 04/10/12 >> b/l lower lobe cylindrical BTX and some in upper lobes CT angio chest 10/26/15 >> patchy GGO in periphery of lungs b/l, basilar BTX, no PE; no significant change compared to 2013 HRCT chest 10/08/18 >> atherosclerosis, basilar predominant cylindrical BTX, mild centrilobular/paraseptal emphysema, air trapping CT angio chest 03/24/19 >> Chronic lung changes with peribronchial thickening, bibasilar atelectasis and chronic interstitial disease/peripheral fibrosis at the lung bases  Sleep tests:  PSG 11/29/08 >> AHI 9 HST 11/10/15 >> AHI 7.1, SaO2 low 75% CPAP 07/04/19 to 08/02/19 >> used on 26 of 30 nights with average 4 hrs 26 min.  Average AHI 7.1 with CPAP 8 cm H2O.  Cardiac tests:  Echo9/24/18 >> mild LVH, EF 55 to 60%, grade 1 DD, mild TR  FENO:  No results found for: NITRICOXIDE  PFT: PFT Results Latest Ref Rng & Units 10/16/2018 01/17/2017  FVC-Pre L 2.52 2.63  FVC-Predicted Pre % 96 98  FVC-Post L 2.49 2.52  FVC-Predicted Post % 94 94  Pre FEV1/FVC % % 90 90  Post FEV1/FCV % % 91 92  FEV1-Pre L 2.27 2.38  FEV1-Predicted Pre % 111 113  FEV1-Post L 2.26 2.32  DLCO UNC% % 59 58  DLCO COR %Predicted % 85 80  TLC L 4.00 3.85  TLC % Predicted % 74 71  RV % Predicted % 58 55    WALK:  No flowsheet data found.  Imaging: DG Chest 2 View  Result Date: 10/23/2019 CLINICAL DATA:  Cough and congestion. EXAM: CHEST - 2 VIEW COMPARISON:  March 23, 2019 FINDINGS: A single lead ventricular pacer is in place. Mild, ill-defined increased lung markings are seen within the bilateral lung bases and upper right lung. These areas are unchanged in  appearance when compared to the prior study. There is no evidence of a pleural effusion or pneumothorax. The cardiac silhouette is mildly enlarged. Multilevel degenerative changes seen throughout the thoracic spine. IMPRESSION: Stable mild, ill-defined increased lung markings within the bilateral lung bases and upper right lung. While these areas appear to be chronic in nature, a mild superimposed component of acute infiltrate cannot be excluded. Electronically Signed   By: Virgina Norfolk M.D.   On: 10/23/2019 17:44    Lab Results:  CBC    Component Value Date/Time   WBC 4.7 10/01/2019 1408   WBC 3.3 (L) 12/04/2018 1240   RBC 4.43 10/01/2019 1408   RBC 4.15 12/04/2018 1240   HGB 13.3 10/01/2019 1408   HGB 12.4 12/01/2014 0845   HCT 59.0 (H) 10/01/2019 1409   HCT 38.8 12/01/2014 0845   PLT 212 10/01/2019 1408   MCV 92 10/01/2019 1408   MCV 88.5 12/01/2014 0845   MCH 30.0 10/01/2019 1408   MCH 29.5 02/27/2017 1518   MCHC 32.7 10/01/2019 1408   MCHC 33.1 12/04/2018 1240   RDW 11.8 10/01/2019 1408   RDW 12.7 12/01/2014 0845   LYMPHSABS 1.0 10/01/2019 1408   LYMPHSABS 1.0 12/01/2014 0845   MONOABS 0.3 12/04/2018 1240   MONOABS 0.3 12/01/2014 0845   EOSABS 0.3 10/01/2019 1408   EOSABS 0.1 03/13/2007 1249   BASOSABS 0.1 10/01/2019 1408   BASOSABS 0.0 12/01/2014 0845    BMET    Component Value Date/Time   NA 140 10/01/2019 1408   NA 141 12/01/2014 0847   K 4.0 10/01/2019 1408   K 3.8 12/01/2014 0847   CL 104 10/01/2019 1408   CO2 23 10/01/2019 1408   CO2 26 12/01/2014 0847   GLUCOSE 110 (H) 10/01/2019 1408   GLUCOSE 101 (H) 12/04/2018 1240   GLUCOSE 175 (H) 12/01/2014 0847   BUN 10 10/01/2019 1408   BUN 10.6 12/01/2014 0847   CREATININE 0.83 10/01/2019 1408   CREATININE 0.82 02/27/2017 1518   CREATININE 0.8 12/01/2014 0847   CALCIUM 9.7 10/01/2019 1408   CALCIUM 9.8 12/01/2014 0847   GFRNONAA 71 10/01/2019 1408   GFRNONAA 74 02/27/2017 1518   GFRAA 82 10/01/2019  1408   GFRAA 85 02/27/2017 1518    BNP    Component Value Date/Time   BNP 92.4 03/17/2019 1555   BNP 30.2 08/16/2016 1452    ProBNP    Component Value Date/Time   PROBNP 89 04/26/2017 1451    Specialty Problems      Pulmonary Problems   Allergic rhinitis    Qualifier: Diagnosis of  By: Glori Bickers MD, Carmell Austria       Obstructive sleep apnea    NPSG 2010:  AHI 9/hr      Dyspnea on exertion    CT  chest 2010:  Minimal LN, no ISLD, a few patchy areas of nodularity.   PFT's 2010:  No obstruction, no restriction, DLCO 51% but corrects with Av. PFT"s 2013:  No obstruction, very mild restriction, DLCO 44% pred but corrects with Av.  CT chest 2013:  No LN, mild bronchiectasis in LL, a few patchy areas of scarring, no ISLD       Globus pharyngeus   Nasopharyngitis, chronic   Bronchiectasis with (acute) exacerbation (HCC)      Allergies  Allergen Reactions  . Ace Inhibitors Swelling    Angioedema; makes tongue "break out"   . Amitriptyline Hcl Other (See Comments)     makes her too sleepy!  . Atorvastatin Other (See Comments)    SEVERE MYALGIA  . Cymbalta [Duloxetine Hcl] Nausea Only and Other (See Comments)    Sleepiness/ sick  . Dilantin [Phenytoin Sodium Extended] Rash    Severe rash  . Paroxetine Nausea Only    Rapid heartbeat  . Ramipril Other (See Comments)    TONGUE ULCERS   . Rosuvastatin Other (See Comments)    SEVERE MYALGIA  . Carbamazepine Rash  . Codeine Itching  . Iodine-131 Other (See Comments)    Swelling at IV site only, no swelling or SOB around mouth.!  . Phenytoin Sodium Extended Rash  . Wellbutrin [Bupropion] Palpitations    Immunization History  Administered Date(s) Administered  . Fluad Quad(high Dose 65+) 04/17/2019  . H1N1 07/24/2008  . Influenza Split 07/17/2011, 09/27/2011, 04/29/2012  . Influenza Whole 07/20/2004, 07/04/2007, 05/07/2008, 05/19/2009, 05/17/2010  . Influenza, High Dose Seasonal PF 05/30/2017, 05/21/2018  .  Influenza,inj,Quad PF,6+ Mos 05/20/2013, 05/06/2014, 05/17/2015, 05/15/2016  . Influenza-Unspecified 05/07/2018  . Pneumococcal Conjugate-13 11/22/2015  . Pneumococcal Polysaccharide-23 05/20/2002, 05/07/2008, 06/25/2014  . Td 09/07/2009  . Zoster 09/21/2011    Past Medical History:  Diagnosis Date  . AICD (automatic cardioverter/defibrillator) present   . Anxiety   . Arthritis   . Back pain   . 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 Bronchiectasis ([place])   . Cerebral aneurysm, nonruptured    had a clip put in  . CHF (congestive heart failure) ([place])   . Clostridium difficile infection   . Depressive disorder, not elsewhere classified   . Diverticulosis of colon (without mention of hemorrhage)   . Esophageal reflux   . Fatty liver   . Fibromyalgia   . Gastritis   . GERD (gastroesophageal reflux disease)   . GI bleed 2004  . Glaucoma   . Hiatal hernia   . Hyperlipidemia   . Hypertension   . Hypothyroidism   . Internal hemorrhoids   . Joint pain   . Obstructive sleep apnea (adult) (pediatric)   . Osteoarthritis   . Ostium secundum type atrial septal defect   . Other chronic nonalcoholic liver disease   . Other pulmonary embolism and infarction   . Palpitations   . Paroxysmal ventricular tachycardia ([place])   . Personal history of radiation therapy   . Pneumonia    history  . Presence of permanent cardiac pacemaker   . PUD (peptic ulcer disease)   . Radiation 02/03/15-03/10/15   Right Breast  . Sarcoid    per pt , not sure  . Schatzki's ring   . Shortness of breath   . Sleep apnea   . Stroke [facility]) 2013   tia/ pt feels it was around 2008 0r 2009  . Takotsubo syndrome   . Tubular  adenoma of colon   . Unspecified transient cerebral ischemia   . Unspecified vitamin D deficiency     Tobacco History: Social History   Tobacco Use  Smoking Status Former Smoker  . Packs/day: 1.00  . Years: 15.00  . Pack years: 15.00  . Types:  Cigarettes  . Quit date: 08/20/1989  . Years since quitting: 30.1  Smokeless Tobacco Never Used  Tobacco Comment   Started at 11; less than 1 PPD   Counseling given: Yes Comment: Started at 83; less than 1 PPD   Continue to not smoke  Outpatient Encounter Medications as of 10/23/2019  Medication Sig  . albuterol (PROVENTIL) (2.5 MG/3ML) 0.083% nebulizer solution USE 1 VIAL IN NEBULIZER EVERY 6 HOURS - and as needed  . albuterol (VENTOLIN HFA) 108 (90 Base) MCG/ACT inhaler Inhale 2 puffs into the lungs every 6 (six) hours as needed for wheezing or shortness of breath.  . Ascorbic Acid (VITAMIN C) 1000 MG tablet Take 1,000 mg by mouth daily.  [name] aspirin 81 MG tablet Take 81 mg by mouth daily.  . baclofen (LIORESAL) 10 MG tablet TAKE 1 TABLET THREE TIMES DAILY (Patient taking differently: 10 mg. )  . calcium carbonate (OS-CAL - DOSED IN MG OF ELEMENTAL CALCIUM) 1250 (500 Ca) MG tablet Take 1 tablet by mouth.  . carvedilol (COREG) 12.5 MG tablet TAKE 1 TABLET TWICE DAILY  . cholecalciferol (VITAMIN D) 1000 UNITS tablet Take 5,000 Units by mouth daily.   . diclofenac (FLECTOR) 1.3 % PTCH Place 1 patch onto the skin 2 (two) times daily.  . fexofenadine (ALLEGRA) 180 MG tablet Take 180 mg by mouth daily as needed for allergies or rhinitis.  . fluticasone (FLONASE) 50 MCG/ACT nasal spray Place 2 sprays into both nostrils daily as needed for allergies or rhinitis.  . furosemide (LASIX) 40 MG tablet Take 1 tablet (40 mg total) by mouth every other day. (Patient taking differently: Take 20 mg by mouth daily. )  . hydroxypropyl methylcellulose / hypromellose (ISOPTO TEARS / GONIOVISC) 2.5 % ophthalmic solution Place 1 drop into both eyes daily.  [name] latanoprost (XALATAN) 0.005 % ophthalmic solution Place 1 drop into both eyes at bedtime.  [name] levothyroxine (SYNTHROID) 50 MCG tablet Take 1 tablet (50 mcg total) by mouth daily.  . Magnesium Oxide 400 (240 Mg) MG TABS Take 1 tablet (400 mg total) by mouth  daily.  . metFORMIN (GLUCOPHAGE) 500 MG tablet Take 1 tablet (500 mg total) by mouth daily.  . mometasone (ELOCON) 0.1 % lotion Apply 1 application topically daily as needed (rash).   . montelukast (SINGULAIR) 10 MG tablet TAKE 1 TABLET AT BEDTIME  . Multiple Vitamins-Minerals (MULTIVITAMIN PO) Take 1 tablet by mouth daily.  . pantoprazole (PROTONIX) 40 MG tablet Take 1 tablet (40 mg total) by mouth daily.  . polyethylene glycol powder (GLYCOLAX/MIRALAX) powder Take 17 grams by mouth daily  . potassium chloride (K-DUR) 10 MEQ tablet TAKE 1/2 TABLET (5MEQ TOTAL) EVERY DAY  . Respiratory Therapy Supplies (FLUTTER) DEVI 1 Device by Does not apply route as needed.  . simvastatin (ZOCOR) 40 MG tablet TAKE 1/2 TABLET (20MG) EVERY DAY  . sodium chloride HYPERTONIC 3 % nebulizer solution Take by nebulization 2 (two) times daily as needed for cough. 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  . traMADol (ULTRAM) 50 MG tablet Take 1 tablet (50 mg total) by mouth 3 (three) times daily as needed. For neck pain.  [name] zinc gluconate 50 MG tablet Take 50 mg by mouth daily.  [name]  zolpidem (AMBIEN) 10 MG tablet TAKE 1 TABLET BY MOUTH AT BEDTIME AS NEEDED FOR SLEEP  . amoxicillin-clavulanate (AUGMENTIN) 875-125 MG tablet Take 1 tablet by mouth 2 (two) times daily.   No facility-administered encounter medications on file as of 10/23/2019.     Review of Systems  Review of Systems  Constitutional: Positive for fatigue. Negative for activity change and fever.  HENT: Negative for sinus pressure, sinus pain and sore throat.   Respiratory: Positive for cough and shortness of breath. Negative for wheezing.        Right-sided back/rib pain  Cardiovascular: Negative for chest pain and palpitations.  Gastrointestinal: Negative for diarrhea, nausea and vomiting.  Neurological: Negative for dizziness.  Psychiatric/Behavioral: Negative for sleep disturbance. The patient is not nervous/anxious.      Physical Exam  BP 118/82 (BP Location:  Left Arm, Patient Position: Sitting, Cuff Size: Normal)   Pulse 68   Temp 98.5 F (36.9 C) (Temporal)   Ht 5' 7"  (1.702 m)   Wt 224 lb (101.6 kg)   LMP  (LMP Unknown)   SpO2 99% Comment: on RA  BMI 35.08 kg/m   Wt Readings from Last 5 Encounters:  10/23/19 224 lb (101.6 kg)  10/15/19 221 lb (100.2 kg)  10/05/19 227 lb (103 kg)  10/01/19 223 lb (101.2 kg)  09/01/19 228 lb 1 oz (103.4 kg)    BMI Readings from Last 5 Encounters:  10/23/19 35.08 kg/m  10/15/19 35.67 kg/m  10/05/19 35.55 kg/m  10/01/19 35.99 kg/m  09/01/19 36.26 kg/m     Physical Exam Vitals and nursing note reviewed.  Constitutional:      General: She is not in acute distress.    Appearance: Normal appearance. She is obese.  HENT:     Head: Normocephalic and atraumatic.     Right Ear: External ear normal.     Left Ear: External ear normal.     Mouth/Throat:     Mouth: Mucous membranes are moist.     Pharynx: Oropharynx is clear.  Eyes:     Pupils: Pupils are equal, round, and reactive to light.  Cardiovascular:     Rate and Rhythm: Normal rate and regular rhythm.     Pulses: Normal pulses.     Heart sounds: Normal heart sounds. No murmur.  Pulmonary:     Effort: Pulmonary effort is normal. No respiratory distress.     Breath sounds: No decreased air movement. Examination of the right-upper field reveals rales. Examination of the right-middle field reveals rales. Examination of the left-middle field reveals rales. Examination of the right-lower field reveals rales. Examination of the left-lower field reveals rales. Rales (Bibasilar, right greater than left,) present. No decreased breath sounds or wheezing.  Musculoskeletal:     Cervical back: Normal range of motion.  Skin:    General: Skin is warm and dry.     Capillary Refill: Capillary refill takes less than 2 seconds.  Neurological:     General: No focal deficit present.     Mental Status: She is alert and oriented to person, place, and  time. Mental status is at baseline.     Gait: Gait normal.  Psychiatric:        Mood and Affect: Mood normal.        Behavior: Behavior normal.        Thought Content: Thought content normal.        Judgment: Judgment normal.       Assessment & Plan:  Bronchiectasis with (acute) exacerbation (HCC) 2 to 3 weeks of worsening cough and congestion Worsening right upper lobe/right back pain Pain tender to palpation Nonadherence to hypertonic saline History of bronchiectasis  Plan: Chest x-ray today Course of Augmentin Continue flutter valve use Resume hypertonic saline nebs use Keep close follow-up with our office later on this month Notify our office if you are having worsening symptoms, fever develops or symptoms not improving despite antibiotic use  If you have persistent chest pain or shortness of breath please contact our office as well as present to an emergency room/urgent care for in person evaluation  Obstructive sleep apnea Plan: Continue CPAP    Return in about 3 weeks (around 11/13/2019), or if symptoms worsen or fail to improve, for Follow up with Dr. Halford Chessman.   Lauraine Rinne, NP 10/23/2019   This appointment required 31 minutes of patient care (this includes precharting, chart review, review of results, face-to-face care, etc.).

## 2019-10-26 NOTE — Progress Notes (Signed)
Reviewed and agree with assessment/plan.   Montavious Wierzba, MD [facility] 08/15/2016, 12:24 PM Pager:  [phone]  

## 2019-10-28 ENCOUNTER — Encounter: Payer: Self-pay | Admitting: Family Medicine

## 2019-10-29 ENCOUNTER — Encounter: Payer: Self-pay | Admitting: Family Medicine

## 2019-10-29 ENCOUNTER — Other Ambulatory Visit: Payer: Self-pay

## 2019-10-29 ENCOUNTER — Ambulatory Visit: Payer: Medicare Other | Admitting: Family Medicine

## 2019-10-29 ENCOUNTER — Ambulatory Visit (INDEPENDENT_AMBULATORY_CARE_PROVIDER_SITE_OTHER): Payer: Medicare Other | Admitting: Family Medicine

## 2019-10-29 VITALS — BP 126/66 | HR 71 | Temp 97.5°F | Ht 67.0 in | Wt 225.0 lb

## 2019-10-29 DIAGNOSIS — R1030 Lower abdominal pain, unspecified: Secondary | ICD-10-CM | POA: Diagnosis not present

## 2019-10-29 DIAGNOSIS — R7989 Other specified abnormal findings of blood chemistry: Secondary | ICD-10-CM | POA: Diagnosis not present

## 2019-10-29 DIAGNOSIS — J471 Bronchiectasis with (acute) exacerbation: Secondary | ICD-10-CM

## 2019-10-29 LAB — CBC WITH DIFFERENTIAL/PLATELET
Basophils Absolute: 0 10*3/uL (ref 0.0–0.1)
Basophils Relative: 0.9 % (ref 0.0–3.0)
Eosinophils Absolute: 0.3 10*3/uL (ref 0.0–0.7)
Eosinophils Relative: 5.9 % — ABNORMAL HIGH (ref 0.0–5.0)
HCT: 38.8 % (ref 36.0–46.0)
Hemoglobin: 12.7 g/dL (ref 12.0–15.0)
Lymphocytes Relative: 23.7 % (ref 12.0–46.0)
Lymphs Abs: 1 10*3/uL (ref 0.7–4.0)
MCHC: 32.7 g/dL (ref 30.0–36.0)
MCV: 91.6 fl (ref 78.0–100.0)
Monocytes Absolute: 0.5 10*3/uL (ref 0.1–1.0)
Monocytes Relative: 10.4 % (ref 3.0–12.0)
Neutro Abs: 2.6 10*3/uL (ref 1.4–7.7)
Neutrophils Relative %: 59.1 % (ref 43.0–77.0)
Platelets: 193 10*3/uL (ref 150.0–400.0)
RBC: 4.24 Mil/uL (ref 3.87–5.11)
RDW: 12.8 % (ref 11.5–15.5)
WBC: 4.4 10*3/uL (ref 4.0–10.5)

## 2019-10-29 LAB — POC URINALSYSI DIPSTICK (AUTOMATED)
Blood, UA: NEGATIVE
Glucose, UA: NEGATIVE
Ketones, UA: NEGATIVE
Leukocytes, UA: NEGATIVE
Nitrite, UA: NEGATIVE
Protein, UA: NEGATIVE
Spec Grav, UA: >=1.03 — AB (ref 1.010–1.025)
Urobilinogen, UA: 0.2 E.U./dL
pH, UA: 6 (ref 5.0–8.0)

## 2019-10-29 LAB — COMPREHENSIVE METABOLIC PANEL
ALT: 58 U/L — ABNORMAL HIGH (ref 0–35)
AST: 44 U/L — ABNORMAL HIGH (ref 0–37)
Albumin: 4 g/dL (ref 3.5–5.2)
Alkaline Phosphatase: 106 U/L (ref 39–117)
BUN: 11 mg/dL (ref 6–23)
CO2: 28 mEq/L (ref 19–32)
Calcium: 10 mg/dL (ref 8.4–10.5)
Chloride: 105 mEq/L (ref 96–112)
Creatinine, Ser: 0.78 mg/dL (ref 0.40–1.20)
GFR: 88.07 mL/min (ref >60.00)
Glucose, Bld: 103 mg/dL — ABNORMAL HIGH (ref 70–99)
Potassium: 4.2 mEq/L (ref 3.5–5.1)
Sodium: 138 mEq/L (ref 135–145)
Total Bilirubin: 0.4 mg/dL (ref 0.2–1.2)
Total Protein: 7.3 g/dL (ref 6.0–8.3)

## 2019-10-29 LAB — LIPASE: Lipase: 24 U/L (ref 11.0–59.0)

## 2019-10-29 NOTE — Patient Instructions (Addendum)
Sign release for records from GYN back in 2019 - including pelvic ultrasound done there.  I think your hernia and enlarged R fallopian tube are contributing to abdominal discomfort.  Urine checked today  Labs today.  I will touch base with Dr Glori Bickers about updating abdominal imaging.

## 2019-10-29 NOTE — Progress Notes (Signed)
This visit was conducted in person.  BP 126/66 (BP Location: Left Arm, Patient Position: Sitting, Cuff Size: Large)   Pulse 71   Temp (!) 97.5 F (36.4 C) (Temporal)   Ht 5\' 7"  (1.702 m)   Wt 225 lb (102.1 kg)   LMP  (LMP Unknown)   SpO2 96%   BMI 35.24 kg/m    CC: back pain, nocturia Subjective:    Patient ID: [name], female    DOB: 1951/01/27, 69 y.o.   MRN: [id number]  HPI: [name] is a 69 y.o. female presenting on 10/29/2019 for Back Pain (C/o low right side back pain radiating around to low abd, worsening.  Denies N/V/D. Started about 6 mos ago. ) and Nocturia (C/o having to get up to urinate during the night so much.  Started mos ago. )   6 mo h/o mid and R lower abdomen with radiation to back. Describes dull achey pain. At its worse at night time 4/10, currently 2/10. Worsening. Affecting ability to sleep at night. Increasing nocturia. No daytime polyuria. No urgency, hematuria, dysuria. No alcohol or caffeine intake.   No fevers/chills, nausea/vomiting, diarrhea or constipation.  Takes citracal and miralax and magnesium daily for constipation.  She has been decreasing portion sizes.   S/p partial hysterectomy for fibroids, ovaries not removed.  Has seen PCP and GI for this.  Sees pulm Dr Halford Chessman for chronic bronchiectasis/emphysema.  Saw pulm last week ?RUL consolidation currently treating with augmentin 10d course.   She had contrasted abd/pelvic CT 01/2018 - diverticulosis, infraumbilical abd wall hernia with part of transverse colon. S/p hysterectomy, ovaries remain but next to R ovary was cystic structure thought hydrosalpinx, benign appearing.   Saw GYN at that time with reassuring pelvic ultrasound - told no further intervention Saw surgery - rec against surgery at that time due to ongoing lung issues.      Relevant past medical, surgical, family and social history reviewed and updated as indicated. Interim medical history since our last visit  reviewed. Allergies and medications reviewed and updated. Outpatient Medications Prior to Visit  Medication Sig Dispense Refill  . albuterol (PROVENTIL) (2.5 MG/3ML) 0.083% nebulizer solution USE 1 VIAL IN NEBULIZER EVERY 6 HOURS - and as needed 75 mL 11  . albuterol (VENTOLIN HFA) 108 (90 Base) MCG/ACT inhaler Inhale 2 puffs into the lungs every 6 (six) hours as needed for wheezing or shortness of breath. 18 g 3  . amoxicillin-clavulanate (AUGMENTIN) 875-125 MG tablet Take 1 tablet by mouth 2 (two) times daily. 20 tablet 0  . Ascorbic Acid (VITAMIN C) 1000 MG tablet Take 1,000 mg by mouth daily.    [name] aspirin 81 MG tablet Take 81 mg by mouth daily.    . baclofen (LIORESAL) 10 MG tablet TAKE 1 TABLET THREE TIMES DAILY (Patient taking differently: 10 mg. ) 270 tablet 0  . calcium carbonate (OS-CAL - DOSED IN MG OF ELEMENTAL CALCIUM) 1250 (500 Ca) MG tablet Take 1 tablet by mouth.    . carvedilol (COREG) 12.5 MG tablet TAKE 1 TABLET TWICE DAILY 180 tablet 1  . cholecalciferol (VITAMIN D) 1000 UNITS tablet Take 5,000 Units by mouth daily.     . diclofenac (FLECTOR) 1.3 % PTCH Place 1 patch onto the skin 2 (two) times daily. 30 patch 0  . fexofenadine (ALLEGRA) 180 MG tablet Take 180 mg by mouth daily as needed for allergies or rhinitis.    . fluticasone (FLONASE) 50 MCG/ACT nasal spray Place 2  sprays into both nostrils daily as needed for allergies or rhinitis. 48 g 3  . furosemide (LASIX) 40 MG tablet Take 1 tablet (40 mg total) by mouth every other day. (Patient taking differently: Take 20 mg by mouth daily. ) 45 tablet 3  . hydroxypropyl methylcellulose / hypromellose (ISOPTO TEARS / GONIOVISC) 2.5 % ophthalmic solution Place 1 drop into both eyes daily.    [name] latanoprost (XALATAN) 0.005 % ophthalmic solution Place 1 drop into both eyes at bedtime.    [name] levothyroxine (SYNTHROID) 50 MCG tablet Take 1 tablet (50 mcg total) by mouth daily. 90 tablet 3  . Magnesium Oxide 400 (240 Mg) MG TABS Take 1  tablet (400 mg total) by mouth daily. 90 tablet 3  . metFORMIN (GLUCOPHAGE) 500 MG tablet Take 1 tablet (500 mg total) by mouth daily. 30 tablet 0  . mometasone (ELOCON) 0.1 % lotion Apply 1 application topically daily as needed (rash).     . montelukast (SINGULAIR) 10 MG tablet TAKE 1 TABLET AT BEDTIME 90 tablet 3  . Multiple Vitamins-Minerals (MULTIVITAMIN PO) Take 1 tablet by mouth daily.    . pantoprazole (PROTONIX) 40 MG tablet Take 1 tablet (40 mg total) by mouth daily. 90 tablet 1  . polyethylene glycol powder (GLYCOLAX/MIRALAX) powder Take 17 grams by mouth daily 1080 g 0  . potassium chloride (K-DUR) 10 MEQ tablet TAKE 1/2 TABLET (5MEQ TOTAL) EVERY DAY 45 tablet 3  . Respiratory Therapy Supplies (FLUTTER) DEVI 1 Device by Does not apply route as needed. 1 each 0  . simvastatin (ZOCOR) 40 MG tablet TAKE 1/2 TABLET (20MG ) EVERY DAY 45 tablet 2  . sodium chloride HYPERTONIC 3 % nebulizer solution Take by nebulization 2 (two) times daily as needed for cough. 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 750 mL 12  . traMADol (ULTRAM) 50 MG tablet Take 1 tablet (50 mg total) by mouth 3 (three) times daily as needed. For neck pain. 21 tablet 0  . zinc gluconate 50 MG tablet Take 50 mg by mouth daily.    [name] zolpidem (AMBIEN) 10 MG tablet TAKE 1 TABLET BY MOUTH AT BEDTIME AS NEEDED FOR SLEEP 90 tablet 1   No facility-administered medications prior to visit.     Per HPI unless specifically indicated in ROS section below Review of Systems Objective:    BP 126/66 (BP Location: Left Arm, Patient Position: Sitting, Cuff Size: Large)   Pulse 71   Temp (!) 97.5 F (36.4 C) (Temporal)   Ht 5\' 7"  (1.702 m)   Wt 225 lb (102.1 kg)   LMP  (LMP Unknown)   SpO2 96%   BMI 35.24 kg/m   Wt Readings from Last 3 Encounters:  10/29/19 225 lb (102.1 kg)  10/23/19 224 lb (101.6 kg)  10/15/19 221 lb (100.2 kg)    Physical Exam Vitals and nursing note reviewed.  Constitutional:      Appearance: Normal appearance. She is not  ill-appearing.  Eyes:     Extraocular Movements: Extraocular movements intact.     Pupils: Pupils are equal, round, and reactive to light.  Cardiovascular:     Rate and Rhythm: Normal rate and regular rhythm.     Pulses: Normal pulses.     Heart sounds: Normal heart sounds. No murmur.  Pulmonary:     Effort: Pulmonary effort is normal. No respiratory distress.     Breath sounds: Normal breath sounds. No wheezing, rhonchi or rales.  Abdominal:     General: Abdomen is flat. Bowel sounds are  normal. There is no distension.     Palpations: Abdomen is soft. There is no mass.     Tenderness: There is abdominal tenderness (mod) in the right lower quadrant and suprapubic area. There is no right CVA tenderness, left CVA tenderness, guarding or rebound. Negative signs include Murphy's sign.     Hernia: A hernia is present. Hernia is present in the umbilical area. There is no hernia in the left inguinal area or right inguinal area.  Musculoskeletal:     Right lower leg: No edema.     Left lower leg: No edema.  Neurological:     Mental Status: She is alert.  Psychiatric:        Mood and Affect: Mood normal.        Behavior: Behavior normal.       Results for orders placed or performed in visit on 10/29/19  Comprehensive metabolic panel  Result Value Ref Range   Sodium 138 135 - 145 mEq/L   Potassium 4.2 3.5 - 5.1 mEq/L   Chloride 105 96 - 112 mEq/L   CO2 28 19 - 32 mEq/L   Glucose, Bld 103 (H) 70 - 99 mg/dL   BUN 11 6 - 23 mg/dL   Creatinine, Ser 0.78 0.40 - 1.20 mg/dL   Total Bilirubin 0.4 0.2 - 1.2 mg/dL   Alkaline Phosphatase 106 39 - 117 U/L   AST 44 (H) 0 - 37 U/L   ALT 58 (H) 0 - 35 U/L   Total Protein 7.3 6.0 - 8.3 g/dL   Albumin 4.0 3.5 - 5.2 g/dL   GFR 88.07 >60.00 mL/min   Calcium 10.0 8.4 - 10.5 mg/dL  CBC with Differential/Platelet  Result Value Ref Range   WBC 4.4 4.0 - 10.5 K/uL   RBC 4.24 3.87 - 5.11 Mil/uL   Hemoglobin 12.7 12.0 - 15.0 g/dL   HCT 38.8 36.0 - 46.0  %   MCV 91.6 78.0 - 100.0 fl   MCHC 32.7 30.0 - 36.0 g/dL   RDW 12.8 11.5 - 15.5 %   Platelets 193.0 150.0 - 400.0 K/uL   Neutrophils Relative % 59.1 43.0 - 77.0 %   Lymphocytes Relative 23.7 12.0 - 46.0 %   Monocytes Relative 10.4 3.0 - 12.0 %   Eosinophils Relative 5.9 (H) 0.0 - 5.0 %   Basophils Relative 0.9 0.0 - 3.0 %   Neutro Abs 2.6 1.4 - 7.7 K/uL   Lymphs Abs 1.0 0.7 - 4.0 K/uL   Monocytes Absolute 0.5 0.1 - 1.0 K/uL   Eosinophils Absolute 0.3 0.0 - 0.7 K/uL   Basophils Absolute 0.0 0.0 - 0.1 K/uL  Lipase  Result Value Ref Range   Lipase 24.0 11.0 - 59.0 U/L  POCT Urinalysis Dipstick (Automated)  Result Value Ref Range   Color, UA yellow    Clarity, UA clear    Glucose, UA Negative Negative   Bilirubin, UA 1+    Ketones, UA negative    Spec Grav, UA >=1.030 (A) 1.010 - 1.025   Blood, UA negative    pH, UA 6.0 5.0 - 8.0   Protein, UA Negative Negative   Urobilinogen, UA 0.2 0.2 or 1.0 E.U./dL   Nitrite, UA negative    Leukocytes, UA Negative Negative   Lab Results  Component Value Date   TSH 2.780 10/01/2019    Assessment & Plan:  This visit occurred during the SARS-CoV-2 public health emergency.  Safety protocols were in place, including screening questions prior to the  visit, additional usage of staff PPE, and extensive cleaning of exam room while observing appropriate contact time as indicated for disinfecting solutions.   Problem List Items Addressed This Visit    Lower abdominal pain - Primary    Longstanding lower abd pain, known previous infraumbilical abd wall hernia, as well as R hydrosalpinx, she has previously had reassuring GYN and surgery evaluation. However now with worsening lower abdominal discomfort associated with nocturia. Ovaries remain. UA today normal except for concentrated. Will check labwork and touch base with PCP about updating imaging with ongoing pain/discomfort.       Relevant Orders   Comprehensive metabolic panel (Completed)   CBC  with Differential/Platelet (Completed)   Lipase (Completed)   POCT Urinalysis Dipstick (Automated) (Completed)   CT Abdomen Pelvis Wo Contrast   Elevated LFTs    LFTs remain mildly elevated, in setting of known fatty liver.       Bronchiectasis with (acute) exacerbation (HCC)    Currently on augmentin course through pulmonology for acute bronchiectasis exacerbation.           No orders of the defined types were placed in this encounter.  Orders Placed This Encounter  Procedures  . CT Abdomen Pelvis Wo Contrast    Standing Status:   Future    Standing Expiration Date:   02/01/2021    Order Specific Question:   ** REASON FOR EXAM (FREE TEXT)    Answer:   lower abd pain, known hydrosalpinx and abdominal wall hernia    Order Specific Question:   Preferred imaging location?    Answer:   [facility]    Order Specific Question:   Is Oral Contrast requested for this exam?    Answer:   Yes, Per Radiology protocol    Order Specific Question:   Radiology Contrast Protocol - do NOT remove file path    Answer:   [email or web address]  . Comprehensive metabolic panel  . CBC with Differential/Platelet  . Lipase  . POCT Urinalysis Dipstick (Automated)    Patient Instructions  Sign release for records from GYN back in 2019 - including pelvic ultrasound done there.  I think your hernia and enlarged R fallopian tube are contributing to abdominal discomfort.  Urine checked today  Labs today.  I will touch base with Dr Glori Bickers about updating abdominal imaging.    Follow up plan: Return if symptoms worsen or fail to improve.  Ria Bush, MD

## 2019-11-01 ENCOUNTER — Other Ambulatory Visit: Payer: Self-pay | Admitting: Rheumatology

## 2019-11-02 ENCOUNTER — Other Ambulatory Visit: Payer: Self-pay

## 2019-11-02 ENCOUNTER — Encounter (INDEPENDENT_AMBULATORY_CARE_PROVIDER_SITE_OTHER): Payer: Self-pay | Admitting: Family Medicine

## 2019-11-02 ENCOUNTER — Ambulatory Visit (INDEPENDENT_AMBULATORY_CARE_PROVIDER_SITE_OTHER): Payer: Medicare Other | Admitting: Family Medicine

## 2019-11-02 VITALS — BP 105/68 | HR 61 | Temp 98.1°F | Ht 66.0 in | Wt 218.0 lb

## 2019-11-02 DIAGNOSIS — Z6835 Body mass index (BMI) 35.0-35.9, adult: Secondary | ICD-10-CM | POA: Diagnosis not present

## 2019-11-02 DIAGNOSIS — E559 Vitamin D deficiency, unspecified: Secondary | ICD-10-CM

## 2019-11-02 DIAGNOSIS — B373 Candidiasis of vulva and vagina: Secondary | ICD-10-CM | POA: Diagnosis not present

## 2019-11-02 DIAGNOSIS — R7303 Prediabetes: Secondary | ICD-10-CM | POA: Diagnosis not present

## 2019-11-02 DIAGNOSIS — K760 Fatty (change of) liver, not elsewhere classified: Secondary | ICD-10-CM | POA: Diagnosis not present

## 2019-11-02 DIAGNOSIS — B3731 Acute candidiasis of vulva and vagina: Secondary | ICD-10-CM

## 2019-11-02 NOTE — Assessment & Plan Note (Signed)
LFTs remain mildly elevated, in setting of known fatty liver.

## 2019-11-02 NOTE — Assessment & Plan Note (Addendum)
Currently on augmentin course through pulmonology for acute bronchiectasis exacerbation.

## 2019-11-02 NOTE — Telephone Encounter (Signed)
Last Visit: 06/15/19 Next Visit: 12/08/19 UDS: 06/09/19 [facility] Agreement: 06/09/19  Last Fill: 09/18/19  Okay to refill Tramadol?

## 2019-11-02 NOTE — Assessment & Plan Note (Addendum)
Longstanding lower abd pain, known previous infraumbilical abd wall hernia, as well as R hydrosalpinx, she has previously had reassuring GYN and surgery evaluation. However now with worsening lower abdominal discomfort associated with nocturia. Ovaries remain. UA today normal except for concentrated. Will check labwork and touch base with PCP about updating imaging with ongoing pain/discomfort.

## 2019-11-03 MED ORDER — FLUCONAZOLE 150 MG PO TABS: ORAL_TABLET | ORAL | 0 refills | Status: DC

## 2019-11-03 MED ORDER — METFORMIN HCL 500 MG PO TABS: 500.0000 mg | ORAL_TABLET | Freq: Every day | ORAL | 0 refills | Status: DC

## 2019-11-03 NOTE — Progress Notes (Signed)
Chief Complaint:   OBESITY [name] is here to discuss her progress with her obesity treatment plan along with follow-up of her obesity related diagnoses. [name] is on keeping a food journal and adhering to recommended goals of 1200-1300 calories and 85 grams of protein and states she is following her eating plan approximately 50% of the time. [name] states she is walking for 15 minutes 2-3 times per week.  Today's visit was #: 3 Starting weight: 223 lbs Starting date: 10/01/2019 Today's weight: 218 lbs Today's date: 11/02/2019 Total lbs lost to date: 5 lbs Total lbs lost since last in-office visit: 3 lbs  Interim History: [place] will be having an abdominal/pelvic CT this week.  Her acute visit note was reviewed.  Subjective:   1. Prediabetes [name] has a diagnosis of prediabetes based on her elevated HgA1c and was informed this puts her at greater risk of developing diabetes. She continues to work on diet and exercise to decrease her risk of diabetes. She denies nausea or hypoglycemia. She is taking metformin 500 mg daily.  Lab Results  Component Value Date   HGBA1C 5.7 (H) 10/01/2019   Lab Results  Component Value Date   INSULIN 15.6 10/01/2019   2. NAFLD (nonalcoholic fatty liver disease) She denies abdominal pain or jaundice.   Lab Results  Component Value Date   ALT 58 (H) 10/29/2019   AST 44 (H) 10/29/2019   ALKPHOS 106 10/29/2019   BILITOT 0.4 10/29/2019   3. Vitamin D deficiency [name]'s Vitamin D level was 50.9 on 10/01/2019. She is currently taking vit D. She denies nausea, vomiting or muscle weakness.  4. Yeast vaginitis [name] believes she has a yeast infection since taking an antibiotic.  Assessment/Plan:   1. Prediabetes [name] will continue to work on weight loss, exercise, and decreasing simple carbohydrates to help decrease the risk of diabetes.   Orders - metFORMIN (GLUCOPHAGE) 500 MG tablet; Take 1 tablet (500 mg total) by mouth daily.  Dispense: 30  tablet; Refill: 0  2. NAFLD (nonalcoholic fatty liver disease) Counseling: Intensive lifestyle modifications are the first line treatment for this issue. We discussed several lifestyle modifications today and she will continue to work on diet, exercise and weight loss efforts. We will continue to monitor.  3. Vitamin D deficiency Low Vitamin D level contributes to fatigue and are associated with obesity, breast, and colon cancer. She agrees to continue to take prescription Vitamin D @50 ,000 IU every week and will follow-up for routine testing of Vitamin D, at least 2-3 times per year to avoid over-replacement.  4. Yeast vaginitis Will send in courtesy dose of Diflucan for her.  Orders - fluconazole (DIFLUCAN) 150 MG tablet; Take 1 tablet. May repeat treatment in 3 days if needed.  Dispense: 2 tablet; Refill: 0  5. Class 2 severe obesity with serious comorbidity and body mass index (BMI) of 35.0 to 35.9 in adult, unspecified obesity type (HCC) [name] is currently in the action stage of change. As such, her goal is to continue with weight loss efforts. She has agreed to keeping a food journal and adhering to recommended goals of 1200-1300 calories and 85 grams of protein.   Exercise goals: As is.  Behavioral modification strategies: increasing lean protein intake and increasing water intake.  [name] has agreed to follow-up with our clinic in 2 weeks. She was informed of the importance of frequent follow-up visits to maximize her success with intensive lifestyle modifications for her multiple health conditions.   Objective:  Blood pressure 105/68, pulse 61, temperature 98.1 F (36.7 C), temperature source Oral, height 5\' 6"  (1.676 m), weight 218 lb (98.9 kg), SpO2 97 %. Body mass index is 35.19 kg/m.  General: Cooperative, alert, well developed, in no acute distress. HEENT: Conjunctivae and lids unremarkable. Cardiovascular: Regular rhythm.  Lungs: Normal work of breathing. Neurologic:  No focal deficits.   Lab Results  Component Value Date   CREATININE 0.78 10/29/2019   BUN 11 10/29/2019   NA 138 10/29/2019   K 4.2 10/29/2019   CL 105 10/29/2019   CO2 28 10/29/2019   Lab Results  Component Value Date   ALT 58 (H) 10/29/2019   AST 44 (H) 10/29/2019   ALKPHOS 106 10/29/2019   BILITOT 0.4 10/29/2019   Lab Results  Component Value Date   HGBA1C 5.7 (H) 10/01/2019   HGBA1C 5.4 12/04/2018   HGBA1C 5.6 11/27/2017   HGBA1C 5.6 04/12/2017   HGBA1C 5.5 11/26/2016   Lab Results  Component Value Date   INSULIN 15.6 10/01/2019   Lab Results  Component Value Date   TSH 2.780 10/01/2019   Lab Results  Component Value Date   CHOL 171 10/01/2019   HDL 47 10/01/2019   LDLCALC 97 10/01/2019   LDLDIRECT 141.8 07/04/2007   TRIG 156 (H) 10/01/2019   CHOLHDL 3.6 10/01/2019   Lab Results  Component Value Date   WBC 4.4 10/29/2019   HGB 12.7 10/29/2019   HCT 38.8 10/29/2019   MCV 91.6 10/29/2019   PLT 193.0 10/29/2019   Lab Results  Component Value Date   IRON 89 10/01/2019   TIBC 335 10/01/2019   FERRITIN 178 (H) 10/01/2019   Obesity Behavioral Intervention:   Approximately 15 minutes were spent on the discussion below.  ASK: We discussed the diagnosis of obesity with [name] today and [name] agreed to give [place] permission to discuss obesity behavioral modification therapy today.  ASSESS: [name] has the diagnosis of obesity and her BMI today is 35.3. [name] is in the action stage of change.   ADVISE: [name] was educated on the multiple health risks of obesity as well as the benefit of weight loss to improve her health. She was advised of the need for long term treatment and the importance of lifestyle modifications to improve her current health and to decrease her risk of future health problems.  AGREE: Multiple dietary modification options and treatment options were discussed and [name] agreed to follow the recommendations documented in the above  note.  ARRANGE: [name] was educated on the importance of frequent visits to treat obesity as outlined per CMS and USPSTF guidelines and agreed to schedule her next follow up appointment today.  Attestation Statements:   Reviewed by clinician on day of visit: allergies, medications, problem list, medical history, surgical history, family history, social history, and previous encounter notes.  I, Water quality scientist, CMA, am acting as Location manager for [employer], DO.  I have reviewed the above documentation for accuracy and completeness, and I agree with the above. Briscoe Deutscher, DO

## 2019-11-05 ENCOUNTER — Other Ambulatory Visit: Payer: Medicare Other

## 2019-11-06 ENCOUNTER — Ambulatory Visit: Payer: Medicare Other | Admitting: Podiatry

## 2019-11-10 ENCOUNTER — Encounter: Payer: Self-pay | Admitting: Pulmonary Disease

## 2019-11-10 ENCOUNTER — Other Ambulatory Visit: Payer: Self-pay

## 2019-11-10 ENCOUNTER — Ambulatory Visit (INDEPENDENT_AMBULATORY_CARE_PROVIDER_SITE_OTHER): Payer: Medicare Other | Admitting: Pulmonary Disease

## 2019-11-10 VITALS — BP 112/70 | HR 54 | Temp 97.1°F | Ht 66.5 in | Wt 226.0 lb

## 2019-11-10 DIAGNOSIS — J309 Allergic rhinitis, unspecified: Secondary | ICD-10-CM | POA: Diagnosis not present

## 2019-11-10 DIAGNOSIS — G4733 Obstructive sleep apnea (adult) (pediatric): Secondary | ICD-10-CM

## 2019-11-10 DIAGNOSIS — J471 Bronchiectasis with (acute) exacerbation: Secondary | ICD-10-CM

## 2019-11-10 MED ORDER — PREDNISONE 10 MG PO TABS: ORAL_TABLET | ORAL | 0 refills | Status: AC

## 2019-11-10 NOTE — Patient Instructions (Signed)
Prednisone 10 mg pill >> 4 pills daily for 2 days, 3 pills daily for 2 days, 2 pills daily for 2 days, 1 pill daily for 2 days  Follow up in 2 months

## 2019-11-10 NOTE — Progress Notes (Signed)
[facility]  Chief Complaint  Patient presents with  . Follow-up    She is c/o some right side back discomort. She states coughing less since the last visit- very little sputum, yellow in color. Her breathing is some better since the last visit but not back to her normal baseline. She is using her albuterol inhaler or neb 1-2 x per day.     Constitutional:  BP 112/70 (BP Location: Right Arm, Cuff Size: Normal)   Pulse (!) 54   Temp (!) 97.1 F (36.2 C) (Temporal)   Ht 5' 6.5" (1.689 m)   Wt 226 lb (102.5 kg)   LMP  (LMP Unknown)   SpO2 95%   BMI 35.93 kg/m   Past Medical History:  Vit D deficiency, Takotsubo CM, Schatzki's ring, PUD, VT s/p AICD, PE, NASH, ASD, Hypothyroidism, HTN, HLD, HH, GERD, Fibromyalgia, Diverticulosis, Depression, C diff, Cerebral aneurysm, Breast cancer 2016, OA  Brief Summary:  [name] is a 69 y.o. female former smoker with bronchiectasis and obstructive sleep apnea.  Seen by Wyn Quaker earlier this month for BTX exacerbation.  Treated with augmentin.  CXR showed chronic changes.  Still has chest tightness, wheeze.  Discomfort in back when breathing worse.  Coughing up clear sputum, but not as much as before.  No fever, sweats.  Sinus and throat okay.  Physical Exam:   Appearance - well kempt   ENMT - no sinus tenderness, no nasal discharge, no oral exudate  Respiratory - coarse BS b/l with faint expiratory wheezing  CV - s1s2 regular rate and rhythm, no murmurs, no peripheral edema, radial pulses symmetric  GI - soft, non tender  Ext - no cyanosis, clubbing, or joint inflammation noted  Psych - normal mood and affect    Assessment/Plan:   Bronchiectasis. - slow to resolve exacerbation - don't think she needs additional ABx - will give course of prednisone  - continue mucinex, hypertonic saline, albuterol prn - she has flutter valve, and chest vest - she will be due for pneumovax booster  when she is recovered from exacerbation - if symptoms don't resolve, then will need CT chest   Upper airway cough syndrome with allergic rhinitis. - continue allegra, flonase, singulair - she uses Navage for nasal irrigation  Obstructive sleep apnea. - she is compliant with CPAP - continue CPAP 9 cm H2O - advised her to adjust humidifier to help with mouth dryness  Ventral hernia with lower abdominal discomfort. - she will f/u with Dr. Hilarie Fredrickson and Dr. [name]   Patient Instructions  Prednisone 10 mg pill >> 4 pills daily for 2 days, 3 pills daily for 2 days, 2 pills daily for 2 days, 1 pill daily for 2 days  Follow up in 2 months   Time spent 34 minutes  Chesley Mires, MD [facility] Pager: [phone] 11/10/2019, 3:41 PM  Flow Sheet     Pulmonary tests:  PFT 04/17/12 >> FEV1 2.60 (110%), FEV1% 85, TLC 4.05 (77%), DLCO 44% PFT 01/17/17 >> FEV1 2.38 (111%), FEV1% 90, TLC 3.85 (71%), DLCO 58%, no BD RAST 05/30/17 >> dust mites PFT 10/16/18 >> FEV1 2.27 (111%), FEV1% 90, TLC 4.00 (74%), DLCO 59%  Serology:  04/02/12 >> ACE 1 10/25/15 >> ESR 40, ACE 32 05/30/17 >> IgA, IgG, IgM normal 10/16/18 >> HP panel negative, ACE 25  Chest imaging:  CT chest 04/10/12 >> b/l lower lobe cylindrical BTX and some in upper lobes CT angio chest  10/26/15 >> patchy GGO in periphery of lungs b/l, basilar BTX, no PE; no significant change compared to 2013 HRCT chest 10/08/18 >> atherosclerosis, basilar predominant cylindrical BTX, mild centrilobular/paraseptal emphysema, air trapping CT angio chest 03/24/19 >> Chronic lung changes with peribronchial thickening, bibasilar atelectasis and chronic interstitial disease/peripheral fibrosis at the lung bases  Sleep tests:  PSG 11/29/08 >> AHI 9 HST 11/10/15 >> AHI 7.1, SaO2 low 75% CPAP 10/10/19 to 11/08/19 >> used on 30 of 30 nights with average 5 hrs 36 min.  Average AHI 2.9 with CPAP 9 cm H2O.  Cardiac tests:  Echo9/24/18 >> mild LVH,  EF 55 to 60%, grade 1 DD, mild TR  Medications:   Allergies as of 11/10/2019      Reactions   Ace Inhibitors Swelling   Angioedema; makes tongue "break out"   Amitriptyline Hcl Other (See Comments)    makes her too sleepy!   Atorvastatin Other (See Comments)   SEVERE MYALGIA   Cymbalta [duloxetine Hcl] Nausea Only, Other (See Comments)   Sleepiness/ sick   Dilantin [phenytoin Sodium Extended] Rash   Severe rash   Paroxetine Nausea Only   Rapid heartbeat   Ramipril Other (See Comments)   TONGUE ULCERS   Rosuvastatin Other (See Comments)   SEVERE MYALGIA   Carbamazepine Rash   Codeine Itching   Iodine-131 Other (See Comments)   Swelling at IV site only, no swelling or SOB around mouth.!   Phenytoin Sodium Extended Rash   Wellbutrin [bupropion] Palpitations      Medication List       Accurate as of November 10, 2019  3:41 PM. If you have any questions, ask your nurse or doctor.        STOP taking these medications   amoxicillin-clavulanate 875-125 MG tablet Commonly known as: Augmentin Stopped by: Chesley Mires, MD   diclofenac 1.3 % Ptch Commonly known as: FLECTOR Stopped by: Chesley Mires, MD   fluconazole 150 MG tablet Commonly known as: DIFLUCAN Stopped by: Chesley Mires, MD     TAKE these medications   albuterol 108 (90 Base) MCG/ACT inhaler Commonly known as: Ventolin HFA Inhale 2 puffs into the lungs every 6 (six) hours as needed for wheezing or shortness of breath.   albuterol (2.5 MG/3ML) 0.083% nebulizer solution Commonly known as: PROVENTIL USE 1 VIAL IN NEBULIZER EVERY 6 HOURS - and as needed   aspirin 81 MG tablet Take 81 mg by mouth daily.   baclofen 10 MG tablet Commonly known as: LIORESAL TAKE 1 TABLET THREE TIMES DAILY What changed:   how to take this  when to take this   calcium carbonate 1250 (500 Ca) MG tablet Commonly known as: OS-CAL - dosed in mg of elemental calcium Take 1 tablet by mouth.   carvedilol 12.5 MG tablet Commonly  known as: COREG TAKE 1 TABLET TWICE DAILY   cholecalciferol 1000 units tablet Commonly known as: VITAMIN D Take 5,000 Units by mouth daily.   fexofenadine 180 MG tablet Commonly known as: ALLEGRA Take 180 mg by mouth daily as needed for allergies or rhinitis.   fluticasone 50 MCG/ACT nasal spray Commonly known as: FLONASE Place 2 sprays into both nostrils daily as needed for allergies or rhinitis.   Flutter Devi 1 Device by Does not apply route as needed.   furosemide 40 MG tablet Commonly known as: Lasix Take 1 tablet (40 mg total) by mouth every other day. What changed:   how much to take  when to take this  hydroxypropyl methylcellulose / hypromellose 2.5 % ophthalmic solution Commonly known as: ISOPTO TEARS / GONIOVISC Place 1 drop into both eyes daily.   latanoprost 0.005 % ophthalmic solution Commonly known as: XALATAN Place 1 drop into both eyes at bedtime.   levothyroxine 50 MCG tablet Commonly known as: SYNTHROID Take 1 tablet (50 mcg total) by mouth daily.   Magnesium Oxide 400 (240 Mg) MG Tabs Take 1 tablet (400 mg total) by mouth daily.   metFORMIN 500 MG tablet Commonly known as: GLUCOPHAGE Take 1 tablet (500 mg total) by mouth daily.   mometasone 0.1 % lotion Commonly known as: ELOCON Apply 1 application topically daily as needed (rash).   montelukast 10 MG tablet Commonly known as: SINGULAIR TAKE 1 TABLET AT BEDTIME   MULTIVITAMIN PO Take 1 tablet by mouth daily.   pantoprazole 40 MG tablet Commonly known as: PROTONIX Take 1 tablet (40 mg total) by mouth daily.   polyethylene glycol powder 17 GM/SCOOP powder Commonly known as: GLYCOLAX/MIRALAX Take 17 grams by mouth daily   potassium chloride 10 MEQ tablet Commonly known as: KLOR-CON TAKE 1/2 TABLET (5MEQ TOTAL) EVERY DAY   predniSONE 10 MG tablet Commonly known as: DELTASONE Take 4 tablets (40 mg total) by mouth daily with breakfast for 2 days, THEN 3 tablets (30 mg total) daily  with breakfast for 2 days, THEN 2 tablets (20 mg total) daily with breakfast for 2 days, THEN 1 tablet (10 mg total) daily with breakfast for 2 days. Start taking on: November 10, 2019 Started by: Chesley Mires, MD   simvastatin 40 MG tablet Commonly known as: ZOCOR TAKE 1/2 TABLET (20MG) EVERY DAY   sodium chloride HYPERTONIC 3 % nebulizer solution Take by nebulization 2 (two) times daily as needed for cough. 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   traMADol 50 MG tablet Commonly known as: ULTRAM TAKE 1 TABLET BY MOUTH THREE TIMES DAILY AS NEEDED FOR  NECK  PAIN   vitamin C 1000 MG tablet Take 1,000 mg by mouth daily.   zinc gluconate 50 MG tablet Take 50 mg by mouth daily.   zolpidem 10 MG tablet Commonly known as: AMBIEN TAKE 1 TABLET BY MOUTH AT BEDTIME AS NEEDED FOR SLEEP       Past Surgical History:  She  has a past surgical history that includes Partial hysterectomy; Cerebral aneurysm repair (02/1999); Knee arthroscopy; Cardiac defibrillator placement (2006; 2012); carotid dopplers (2006); hospitalization (2004); hospitalization; ABI (2006); left heart catheterization with coronary angiogram (N/A, 02/22/2012); Breast lumpectomy with radioactive seed localization (Right, 01/03/2015); Total knee arthroplasty (Left, 02/13/2016); Breast excisional biopsy; Tongue Biopsy (12/12/2017); Colonoscopy; and Upper gastrointestinal endoscopy.  Family History:  Her family history includes Allergies in her sister; Alzheimer's disease in her mother; Breast cancer in her cousin and maternal aunt; Breast cancer (age of onset: 33) in her maternal aunt; Cancer (age of onset: 31) in her maternal grandmother; Cardiomyopathy in an other family member; Colon cancer (age of onset: 15) in her maternal aunt; Diabetes in her mother; Heart disease in her brother, father, sister, and son; Hypertension in her mother; Lung cancer in her maternal uncle; Lung cancer (age of onset: 63) in her maternal grandfather; Obesity in her mother;  Other in her brother; Pancreatic cancer (age of onset: 13) in her cousin; Prostate cancer (age of onset: 39) in her brother; Throat cancer in her brother.  Social History:  She  reports that she quit smoking about 30 years ago. Her smoking use included cigarettes. She has a 15.00 pack-year  smoking history. She has never used smokeless tobacco. She reports current alcohol use. She reports that she does not use drugs.

## 2019-11-11 ENCOUNTER — Telehealth: Payer: Self-pay | Admitting: Pulmonary Disease

## 2019-11-11 NOTE — Telephone Encounter (Signed)
Ashly with [employer] called stating that he is aware that the pt was seen yesterday and is suggesting that maybe a LABA would be helpful for this pt. I advised that I believe that if Dr Halford Chessman wanted the pt to have this he would have prescribed it at her visit. Nothing further needed.

## 2019-11-13 ENCOUNTER — Telehealth: Payer: Self-pay | Admitting: Pulmonary Disease

## 2019-11-13 NOTE — Telephone Encounter (Signed)
Will hold message in triage (VS has no assigned nurse/cma) to follow up on next week when VS returns to clinic.

## 2019-11-13 NOTE — Progress Notes (Signed)
Electrophysiology Office Note Date: 11/16/2019  ID:  [name], DOB 02/01/1951, MRN [id number]  PCP: Abner Greenspan, MD Electrophysiologist: Virl Axe, MD   CC: Routine ICD follow-up  [name] is a 69 y.o. female seen today for Dr. Caryl Comes.  They present today for routine electrophysiology followup.  Since last being seen in our clinic, the patient reports doing well overall. She is currently on prednisone for a URI. She has noticed more palpitations (daily) but she is not sure if this is related to the prednisone, or preceded it. She does not drink caffeine or alcohol. She wears her CPAP everynight. She denies chest pain, dyspnea, PND, orthopnea, nausea, vomiting, dizziness, syncope, edema, weight gain, or early satiety.  She has not had ICD shocks.   Device History: Research officer, political party ICD implanted 12/22/2009 for VT History of appropriate therapy: No History of AAD therapy: Yes   Past Medical History:  Diagnosis Date  . AICD (automatic cardioverter/defibrillator) present   . Anxiety   . Arthritis   . Back pain   . 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 Bronchiectasis ([place])   . Cerebral aneurysm, nonruptured    had a clip put in  . CHF (congestive heart failure) ([facility])   . Clostridium difficile infection   . Depressive disorder, not elsewhere classified   . Diverticulosis of colon (without mention of hemorrhage)   . Esophageal reflux   . Fatty liver   . Fibromyalgia   . Gastritis   . GERD (gastroesophageal reflux disease)   . GI bleed 2004  . Glaucoma   . Hiatal hernia   . Hyperlipidemia   . Hypertension   . Hypothyroidism   . Internal hemorrhoids   . Joint pain   . Obstructive sleep apnea (adult) (pediatric)   . Osteoarthritis   . Ostium secundum type atrial septal defect   . Other chronic nonalcoholic liver disease   . Other pulmonary embolism and infarction   . Palpitations   . Paroxysmal ventricular  tachycardia ([place])   . Personal history of radiation therapy   . Pneumonia    history  . Presence of permanent cardiac pacemaker   . PUD (peptic ulcer disease)   . Radiation 02/03/15-03/10/15   Right Breast  . Sarcoid    per pt , not sure  . Schatzki's ring   . Shortness of breath   . Sleep apnea   . Stroke [facility]) 2013   tia/ pt feels it was around 2008 0r 2009  . Takotsubo syndrome   . Tubular adenoma of colon   . Unspecified transient cerebral ischemia   . Unspecified vitamin D deficiency    Past Surgical History:  Procedure Laterality Date  . ABI  2006   normal  . BREAST EXCISIONAL BIOPSY    . BREAST LUMPECTOMY WITH RADIOACTIVE SEED LOCALIZATION Right 01/03/2015   Procedure: BREAST LUMPECTOMY WITH RADIOACTIVE SEED LOCALIZATION;  Surgeon: Autumn Messing III, MD;  Location: [facility];  Service: General;  Laterality: Right;  . CARDIAC DEFIBRILLATOR PLACEMENT  2006; 2012   BSX single chamber ICD  . carotid dopplers  2006   neg  . CEREBRAL ANEURYSM REPAIR  02/1999  . COLONOSCOPY    . hospitalization  2004   GI bleed, PUD, diverticulosis (EGD,colonscopy)  . hospitalization     PE, NSVT, s/p defib  . KNEE ARTHROSCOPY     bilateral  . LEFT HEART CATHETERIZATION WITH CORONARY ANGIOGRAM  N/A 02/22/2012   Procedure: LEFT HEART CATHETERIZATION WITH CORONARY ANGIOGRAM;  Surgeon: Burnell Blanks, MD;  Location: [facility] CATH LAB;  Service: Cardiovascular;  Laterality: N/A;  . PARTIAL HYSTERECTOMY     Fibroids  . TONGUE BIOPSY  12/12/2017   due to sore tongue and white patches/abnormal cells  . TOTAL KNEE ARTHROPLASTY Left 02/13/2016   Procedure: TOTAL KNEE ARTHROPLASTY;  Surgeon: Vickey Huger, MD;  Location: [facility];  Service: Orthopedics;  Laterality: Left;  . UPPER GASTROINTESTINAL ENDOSCOPY      Current Outpatient Medications  Medication Sig Dispense Refill  . albuterol (PROVENTIL) (2.5 MG/3ML) 0.083% nebulizer solution USE 1 VIAL IN NEBULIZER EVERY 6 HOURS - and as needed 75 mL 11  .  albuterol (VENTOLIN HFA) 108 (90 Base) MCG/ACT inhaler Inhale 2 puffs into the lungs every 6 (six) hours as needed for wheezing or shortness of breath. 18 g 3  . Ascorbic Acid (VITAMIN C) 1000 MG tablet Take 1,000 mg by mouth daily.    [name] aspirin 81 MG tablet Take 81 mg by mouth daily.    . baclofen (LIORESAL) 10 MG tablet Take 10 mg by mouth at bedtime.    . calcium carbonate (OS-CAL - DOSED IN MG OF ELEMENTAL CALCIUM) 1250 (500 Ca) MG tablet Take 1 tablet by mouth.    . carvedilol (COREG) 12.5 MG tablet TAKE 1 TABLET TWICE DAILY 180 tablet 1  . cholecalciferol (VITAMIN D) 1000 UNITS tablet Take 5,000 Units by mouth daily.     . fexofenadine (ALLEGRA) 180 MG tablet Take 180 mg by mouth daily as needed for allergies or rhinitis.    . fluticasone (FLONASE) 50 MCG/ACT nasal spray Place 2 sprays into both nostrils daily as needed for allergies or rhinitis. 48 g 3  . furosemide (LASIX) 40 MG tablet Take 20 mg by mouth daily. 20-40mg     . hydroxypropyl methylcellulose / hypromellose (ISOPTO TEARS / GONIOVISC) 2.5 % ophthalmic solution Place 1 drop into both eyes daily.    [name] latanoprost (XALATAN) 0.005 % ophthalmic solution Place 1 drop into both eyes at bedtime.    [name] levothyroxine (SYNTHROID) 50 MCG tablet Take 1 tablet (50 mcg total) by mouth daily. 90 tablet 3  . Magnesium Oxide 400 (240 Mg) MG TABS Take 1 tablet (400 mg total) by mouth daily. 90 tablet 3  . metFORMIN (GLUCOPHAGE) 500 MG tablet Take 1 tablet (500 mg total) by mouth daily. 30 tablet 0  . mometasone (ELOCON) 0.1 % lotion Apply 1 application topically daily as needed (rash).     . montelukast (SINGULAIR) 10 MG tablet TAKE 1 TABLET AT BEDTIME 90 tablet 3  . Multiple Vitamins-Minerals (MULTIVITAMIN PO) Take 1 tablet by mouth daily.    . pantoprazole (PROTONIX) 40 MG tablet Take 1 tablet (40 mg total) by mouth daily. 90 tablet 1  . polyethylene glycol powder (GLYCOLAX/MIRALAX) powder Take 17 grams by mouth daily 1080 g 0  . potassium  chloride (K-DUR) 10 MEQ tablet TAKE 1/2 TABLET (5MEQ TOTAL) EVERY DAY 45 tablet 3  . predniSONE (DELTASONE) 10 MG tablet Take 4 tablets (40 mg total) by mouth daily with breakfast for 2 days, THEN 3 tablets (30 mg total) daily with breakfast for 2 days, THEN 2 tablets (20 mg total) daily with breakfast for 2 days, THEN 1 tablet (10 mg total) daily with breakfast for 2 days. 20 tablet 0  . Respiratory Therapy Supplies (FLUTTER) DEVI 1 Device by Does not apply route as needed. 1 each 0  .  simvastatin (ZOCOR) 40 MG tablet TAKE 1/2 TABLET (20MG ) EVERY DAY 45 tablet 2  . sodium chloride HYPERTONIC 3 % nebulizer solution Take by nebulization 2 (two) times daily as needed for cough. 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 750 mL 12  . traMADol (ULTRAM) 50 MG tablet TAKE 1 TABLET BY MOUTH THREE TIMES DAILY AS NEEDED FOR  NECK  PAIN 21 tablet 0  . zinc gluconate 50 MG tablet Take 50 mg by mouth daily.    [name] zolpidem (AMBIEN) 10 MG tablet TAKE 1 TABLET BY MOUTH AT BEDTIME AS NEEDED FOR SLEEP 90 tablet 1   No current facility-administered medications for this visit.    Allergies:   Ace inhibitors, Amitriptyline hcl, Atorvastatin, Cymbalta [duloxetine hcl], Dilantin [phenytoin sodium extended], Paroxetine, Ramipril, Rosuvastatin, Carbamazepine, Codeine, Iodine-131, Phenytoin sodium extended, and Wellbutrin [bupropion]   Social History: Social History   Socioeconomic History  . Marital status: Divorced    Spouse name: Not on file  . Number of children: 2  . Years of education: 3  . Highest education level: Not on file  Occupational History  . Occupation: DISABLED  Tobacco Use  . Smoking status: Former Smoker    Packs/day: 1.00    Years: 15.00    Pack years: 15.00    Types: Cigarettes    Quit date: 08/20/1989    Years since quitting: 30.2  . Smokeless tobacco: Never Used  . Tobacco comment: Started at 36; less than 1 PPD  Substance and Sexual Activity  . Alcohol use: Yes    Alcohol/week: 0.0 standard drinks     Comment: rare  . Drug use: No  . Sexual activity: Not Currently  Other Topics Concern  . Not on file  Social History Narrative   Lives alone   Caffeine [id number]   Retired from [employer]   2 children   Divorced   Right handed    Social Determinants of Health   Financial Resource Strain:   . Difficulty of Paying Living Expenses:   Food Insecurity:   . Worried About Charity fundraiser in the Last Year:   . Arboriculturist in the Last Year:   Transportation Needs:   . Film/video editor (Medical):   [name] Lack of Transportation (Non-Medical):   Physical Activity:   . Days of Exercise per Week:   . Minutes of Exercise per Session:   Stress:   . Feeling of Stress :   Social Connections:   . Frequency of Communication with Friends and Family:   . Frequency of Social Gatherings with Friends and Family:   . Attends Religious Services:   . Active Member of Clubs or Organizations:   . Attends Archivist Meetings:   [name] Marital Status:   Intimate Partner Violence:   . Fear of Current or Ex-Partner:   . Emotionally Abused:   [name] Physically Abused:   . Sexually Abused:     Family History: Family History  Problem Relation Age of Onset  . Diabetes Mother   . Alzheimer's disease Mother   . Hypertension Mother   . Obesity Mother   . Other Brother        Thyroid problem 10/2016  . Allergies Sister   . Heart disease Sister   . Heart disease Brother   . Prostate cancer Brother 83       same brother as throat cancer  . Throat cancer Brother        dx in his 50s; also a  smoker  . Heart disease Father   . Cancer Maternal Grandmother 76       colon cancer or abdominal cancer  . Lung cancer Maternal Grandfather 63  . Breast cancer Maternal Aunt 28  . Colon cancer Maternal Aunt 39       same sister as breast at 59  . Lung cancer Maternal Uncle   . Breast cancer Maternal Aunt        dx in her 74s  . Pancreatic cancer Cousin 53       maternal first cousin  . Breast  cancer Cousin        paternal first cousin twice removed died in her 39s  . Cardiomyopathy Other        Family history  . Heart disease Son        Cardiac Arrest 07/2016  . Stroke Neg Hx     Review of Systems: All other systems reviewed and are otherwise negative except as noted above.   Physical Exam: Vitals:   11/16/19 1220  BP: 130/72  Pulse: 60  SpO2: 97%  Weight: 225 lb (102.1 kg)  Height: 5' 6.5" (1.689 m)     GEN- The patient is well appearing, alert and oriented x 3 today.   HEENT: normocephalic, atraumatic; sclera clear, conjunctiva pink; hearing intact; oropharynx clear; neck supple, no JVP Lymph- no cervical lymphadenopathy Lungs- Clear to ausculation bilaterally, normal work of breathing.  No wheezes, rales, rhonchi Heart- Regular rate and rhythm, no murmurs, rubs or gallops, PMI not laterally displaced GI- soft, non-tender, non-distended, bowel sounds present, no hepatosplenomegaly Extremities- no clubbing, cyanosis, or edema; DP/PT/radial pulses 2+ bilaterally MS- no significant deformity or atrophy Skin- warm and dry, no rash or lesion; ICD pocket well healed Psych- euthymic mood, full affect Neuro- strength and sensation are intact  ICD interrogation- reviewed in detail today,  See PACEART report  EKG:  EKG is not ordered today.  Recent Labs: 03/17/2019: BNP 92.4 10/01/2019: TSH 2.780 10/29/2019: ALT 58; BUN 11; Creatinine, Ser 0.78; Hemoglobin 12.7; Platelets 193.0; Potassium 4.2; Sodium 138   Wt Readings from Last 3 Encounters:  11/16/19 225 lb (102.1 kg)  11/10/19 226 lb (102.5 kg)  11/02/19 218 lb (98.9 kg)     Other studies Reviewed: Additional studies/ records that were reviewed today include: Previous office notes, previous echo, previous remotes checks, most recent labwork.   Assessment and Plan:  1.  Ventricular tachycardia s/p [employer] single chamber ICD  euvolemic today Stable on an appropriate medical regimen Normal ICD  function See Pace Art report No changes today  2 PVCs -> ? Slow NSVT Attempted on empiric mag ox at last visit.  Palpitations initially had improvement on coreg 12.5 mg BID.  Nothing recent on device.  She has worsening palpitations, but may be due to prednisone. Will finish prednisone course then wear 7 day Zio patch to further clarify.  Prefers not to have labwork today.   3. HTN Continue current medications  4. OSA Encouraged nightly CPAP use  Current medicines are reviewed at length with the patient today.   The patient does not have concerns regarding her medicines.  The following changes were made today:  none  Labs/ tests ordered today include:  Orders Placed This Encounter  Procedures  . LONG TERM MONITOR (3-14 DAYS)  . CUP PACEART INCLINIC DEVICE CHECK     Disposition:   Follow up with me in 6-8 weeks to discuss Zio.   Signed, Satira Mccallum  Sula Rumple  11/16/2019 2:19 PM  [address] [place] [address] [phone] (office) [phone] (fax)

## 2019-11-13 NOTE — Telephone Encounter (Signed)
Will hold in my inbox until VS returns to clinic.   Spoke with patient. She is aware that we will hold onto the paperwork. She wishes to be called once the letter has been created and signed.

## 2019-11-13 NOTE — Telephone Encounter (Signed)
Received a jury duty summons in the mail from patient. She is requesting to be excused from jury duty.   Her last office visit was on 11/10/2019. She was instructed to follow up in 2 months. She has a history of OSA and bronchiectasis.   Summons will be placed in VS' look-at.   Dr. Halford Chessman, are you ok with [place] writing a letter to excuse her from jury duty?

## 2019-11-13 NOTE — Telephone Encounter (Signed)
I am okay with writing letter.  However, I am on vacation next week.  Letter will not be available until April.

## 2019-11-16 ENCOUNTER — Ambulatory Visit (INDEPENDENT_AMBULATORY_CARE_PROVIDER_SITE_OTHER): Payer: Medicare Other | Admitting: Student

## 2019-11-16 ENCOUNTER — Other Ambulatory Visit: Payer: Self-pay | Admitting: Family Medicine

## 2019-11-16 ENCOUNTER — Encounter: Payer: Self-pay | Admitting: Student

## 2019-11-16 ENCOUNTER — Other Ambulatory Visit: Payer: Self-pay

## 2019-11-16 VITALS — BP 130/72 | HR 60 | Ht 66.5 in | Wt 225.0 lb

## 2019-11-16 DIAGNOSIS — R002 Palpitations: Secondary | ICD-10-CM | POA: Diagnosis not present

## 2019-11-16 DIAGNOSIS — I472 Ventricular tachycardia: Secondary | ICD-10-CM | POA: Diagnosis not present

## 2019-11-16 DIAGNOSIS — I428 Other cardiomyopathies: Secondary | ICD-10-CM

## 2019-11-16 DIAGNOSIS — I4729 Other ventricular tachycardia: Secondary | ICD-10-CM

## 2019-11-16 LAB — CUP PACEART INCLINIC DEVICE CHECK
Date Time Interrogation Session: 20210329134751
HighPow Impedance: 38 Ohm
HighPow Impedance: 68 Ohm
Implantable Lead Implant Date: 20050603
Implantable Lead Location: 753860
Implantable Lead Model: 185
Implantable Lead Serial Number: 116340
Implantable Pulse Generator Implant Date: 20110505
Lead Channel Impedance Value: 657 Ohm
Lead Channel Pacing Threshold Amplitude: 0.7 V
Lead Channel Pacing Threshold Pulse Width: 0.4 ms
Lead Channel Sensing Intrinsic Amplitude: 10.9 mV
Lead Channel Setting Pacing Amplitude: 2.4 V
Lead Channel Setting Pacing Pulse Width: 0.4 ms
Lead Channel Setting Sensing Sensitivity: 0.4 mV
Pulse Gen Serial Number: 266301

## 2019-11-16 NOTE — Patient Instructions (Addendum)
Medication Instructions:  none *If you need a refill on your cardiac medications before your next appointment, please call your pharmacy*   Lab Work: none If you have labs (blood work) drawn today and your tests are completely normal, you will receive your results only by: [name] MyChart Message (if you have MyChart) OR . A paper copy in the mail If you have any lab test that is abnormal or we need to change your treatment, we will call you to review the results.   Testing/Procedures:  SOMEONE WILL CALL TO ARRANGE (NEXT WEEK) ZIO PATCH MONITOR (7 DAYS)   Follow-Up: At [facility], you and your health needs are our priority.  As part of our continuing mission to provide you with exceptional heart care, we have created designated Provider Care Teams.  These Care Teams include your primary Cardiologist (physician) and Advanced Practice Providers (APPs -  Physician Assistants and Nurse Practitioners) who all work together to provide you with the care you need, when you need it.   Your next appointment:   6 WEEKS  The format for your next appointment:   In Person  Provider:   Oda Kilts, PA   Other Instructions Remote monitoring is used to monitor your  ICD from home. This monitoring reduces the number of office visits required to check your device to one time per year. It allows [place] to keep an eye on the functioning of your device to ensure it is working properly. You are scheduled for a device check from home on 12/02/19. You may send your transmission at any time that day. If you have a wireless device, the transmission will be sent automatically. After your physician reviews your transmission, you will receive a postcard with your next transmission date.

## 2019-11-16 NOTE — Telephone Encounter (Signed)
Name of [place] Name of [place] or Written Date and Quantity:05/26/19 #90 tabs with 1 refill Last Office Visit and Type:10/29/19 Back pain Next Office Visit and Type:12/10/19 CPE Last Controlled Substance Agreement Date:11/30/16 Last UDS:06/01/17

## 2019-11-16 NOTE — Telephone Encounter (Signed)
This looks too early?

## 2019-11-17 ENCOUNTER — Encounter: Payer: Self-pay | Admitting: *Deleted

## 2019-11-17 NOTE — Telephone Encounter (Signed)
Patient left a voicemail wanting to know why her Ambien was denied? Patient requested a call back.

## 2019-11-17 NOTE — Progress Notes (Signed)
Patient ID: [name], female   DOB: 1950-12-20, 69 y.o.   MRN: [id number] Patient enrolled for [employer] to mail a 7 day ZIO XT long term holter monitor to her home.  Instructions sent to patient via My Chart message , and will be included in monitor kit as well.

## 2019-11-17 NOTE — Telephone Encounter (Signed)
Pt will be out of med on Sunday, due to [place] (Good Friday), I advised pt to have pharmacy resend [place] the refill request on Thursday and we can fill med. Pt understands it was just a little to early to fill

## 2019-11-18 ENCOUNTER — Other Ambulatory Visit: Payer: Self-pay

## 2019-11-18 ENCOUNTER — Ambulatory Visit
Admission: RE | Admit: 2019-11-18 | Discharge: 2019-11-18 | Disposition: A | Discharge status: Home / Self Care | Payer: Medicare Other | Source: Ambulatory Visit | Attending: Family Medicine | Admitting: Family Medicine

## 2019-11-18 DIAGNOSIS — R1030 Lower abdominal pain, unspecified: Secondary | ICD-10-CM

## 2019-11-19 ENCOUNTER — Other Ambulatory Visit: Payer: Self-pay | Admitting: Family Medicine

## 2019-11-19 NOTE — Telephone Encounter (Signed)
Name of [place] Name of [place] or Written Date and Quantity:05/26/19 #90 tabs with 1 refill Last Office Visit and Type:10/29/19 Back pain Next Office Visit and Type:12/10/19 CPE Last Controlled Substance Agreement Date:11/30/16 Last UDS:06/01/17  Pt will be out of med on Sunday but since we are closed tomorrow will route to provider to fill a few days early

## 2019-11-23 ENCOUNTER — Encounter (INDEPENDENT_AMBULATORY_CARE_PROVIDER_SITE_OTHER): Payer: Self-pay | Admitting: Family Medicine

## 2019-11-23 ENCOUNTER — Ambulatory Visit (INDEPENDENT_AMBULATORY_CARE_PROVIDER_SITE_OTHER): Payer: Medicare Other | Admitting: Family Medicine

## 2019-11-23 ENCOUNTER — Other Ambulatory Visit: Payer: Self-pay

## 2019-11-23 VITALS — BP 108/68 | HR 67 | Temp 97.7°F | Ht 66.0 in | Wt 221.0 lb

## 2019-11-23 DIAGNOSIS — R7303 Prediabetes: Secondary | ICD-10-CM | POA: Diagnosis not present

## 2019-11-23 DIAGNOSIS — G4733 Obstructive sleep apnea (adult) (pediatric): Secondary | ICD-10-CM

## 2019-11-23 DIAGNOSIS — E559 Vitamin D deficiency, unspecified: Secondary | ICD-10-CM | POA: Diagnosis not present

## 2019-11-23 DIAGNOSIS — Z9989 Dependence on other enabling machines and devices: Secondary | ICD-10-CM

## 2019-11-23 DIAGNOSIS — K760 Fatty (change of) liver, not elsewhere classified: Secondary | ICD-10-CM | POA: Diagnosis not present

## 2019-11-23 DIAGNOSIS — Z6835 Body mass index (BMI) 35.0-35.9, adult: Secondary | ICD-10-CM | POA: Diagnosis not present

## 2019-11-23 MED ORDER — METFORMIN HCL 500 MG PO TABS: 500.0000 mg | ORAL_TABLET | Freq: Two times a day (BID) | ORAL | 0 refills | Status: DC

## 2019-11-23 NOTE — Progress Notes (Signed)
Chief Complaint:   OBESITY [name] is here to discuss her progress with her obesity treatment plan along with follow-up of her obesity related diagnoses. [name] is on keeping a food journal and adhering to recommended goals of 1200-1300 calories and 85 grams of protein and states she is following her eating plan approximately 0% of the time. [name] states she is doing yard work 1-2 times per week.  Today's visit was #: 4 Starting weight: 223 lbs Starting date: 10/01/2019 Today's weight: 221 lbs Today's date: 11/23/2019 Total lbs lost to date: 2 lbs Total lbs lost since last in-office visit: 0  Interim History: [name] had a bronchiectasis flare.  Subjective:   1. Prediabetes Sreenidhi has a diagnosis of prediabetes based on her elevated HgA1c and was informed this puts her at greater risk of developing diabetes. She continues to work on diet and exercise to decrease her risk of diabetes. She denies nausea or hypoglycemia.  Lab Results  Component Value Date   HGBA1C 5.7 (H) 10/01/2019   Lab Results  Component Value Date   INSULIN 15.6 10/01/2019   2. Vitamin D deficiency [name]'s Vitamin D level was 50.9 on 10/01/2019. She is currently taking OTC vitamin D 5000 each day. She denies nausea, vomiting or muscle weakness.  3. NAFLD (nonalcoholic fatty liver disease) [name] has the diagnosis of NAFLD.  Lab Results  Component Value Date   ALT 58 (H) 10/29/2019   AST 44 (H) 10/29/2019   ALKPHOS 106 10/29/2019   BILITOT 0.4 10/29/2019   Lab Results  Component Value Date   WBC 4.4 10/29/2019   HGB 12.7 10/29/2019   HCT 38.8 10/29/2019   MCV 91.6 10/29/2019   PLT 193.0 10/29/2019   Body mass index is 35.67 kg/m. 69 y.o.   4. OSA on CPAP [name] has a diagnosis of sleep apnea. She reports that she is using a CPAP regularly.   Assessment/Plan:   1. Prediabetes [name] will continue to work on weight loss, exercise, and decreasing simple carbohydrates to help decrease the risk of  diabetes.   Orders - metFORMIN (GLUCOPHAGE) 500 MG tablet; Take 1 tablet (500 mg total) by mouth 2 (two) times daily.  Dispense: 60 tablet; Refill: 0  2. Vitamin D deficiency Low Vitamin D level contributes to fatigue and are associated with obesity, breast, and colon cancer. She agrees to continue to take prescription Vitamin D @50 ,000 IU every week and will follow-up for routine testing of Vitamin D, at least 2-3 times per year to avoid over-replacement.  3. NAFLD (nonalcoholic fatty liver disease) FIBROSIS SCORING SYSTEM: NAFLD Fibrosis Score: 1.58 points Correlated Fibrosis Severity: F3-F4 (severe) Depending on score and local prevalence of advanced fibrosis, the score can be used to reliably predict (with high 80-low 90% accuracy) which patients are unlikely to have cellular evidence of fibrosis on biopsy.  There is not an FDA approved medication available yet to treat NAFLD, but treating comorbid conditions plus weight loss will improve NAFLD. LIVER-DIRECTED treatments include: liraglutide, pioglitazone, and vitamin E.   Consider referral to Hepatology. Note: recent CT abdomen without focal findings or fibrosis/cirhosis description.   4. OSA on CPAP Intensive lifestyle modifications are the first line treatment for this issue. We discussed several lifestyle modifications today and she will continue to work on diet, exercise and weight loss efforts. We will continue to monitor. Orders and follow up as documented in patient record.   Counseling  Sleep apnea is a condition in which breathing pauses or becomes  shallow during sleep. This happens over and over during the night. This disrupts your sleep and keeps your body from getting the rest that it needs, which can cause tiredness and lack of energy (fatigue) during the day.  Sleep apnea treatment: If you were given a device to open your airway while you sleep, USE IT!  Sleep hygiene:   Limit or avoid alcohol, caffeinated beverages,  and cigarettes, especially close to bedtime.   Do not eat a large meal or eat spicy foods right before bedtime. This can lead to digestive discomfort that can make it hard for you to sleep.  Keep a sleep diary to help you and your health care provider figure out what could be causing your insomnia.  . Make your bedroom a dark, comfortable place where it is easy to fall asleep. ? Put up shades or blackout curtains to block light from outside. ? Use a white noise machine to block noise. ? Keep the temperature cool. . Limit screen use before bedtime. This includes: ? Watching TV. ? Using your smartphone, tablet, or computer. . Stick to a routine that includes going to bed and waking up at the same times every day and night. This can help you fall asleep faster. Consider making a quiet activity, such as reading, part of your nighttime routine. . Try to avoid taking naps during the day so that you sleep better at night. . Get out of bed if you are still awake after 15 minutes of trying to sleep. Keep the lights down, but try reading or doing a quiet activity. When you feel sleepy, go back to bed.  5. Class 2 severe obesity with serious comorbidity and body mass index (BMI) of 35.0 to 35.9 in adult, unspecified obesity type (HCC) [name] is currently in the action stage of change. As such, her goal is to continue with weight loss efforts. She has agreed to keeping a food journal and adhering to recommended goals of 1200-1300 calories and 85 grams of protein.   Exercise goals: As is.  Behavioral modification strategies: increasing lean protein intake and increasing water intake.  [name] has agreed to follow-up with our clinic in 2 weeks. She was informed of the importance of frequent follow-up visits to maximize her success with intensive lifestyle modifications for her multiple health conditions.   Objective:   Blood pressure 108/68, pulse 67, temperature 97.7 F (36.5 C), temperature source Oral,  height 5\' 6"  (1.676 m), weight 221 lb (100.2 kg), SpO2 97 %. Body mass index is 35.67 kg/m.  General: Cooperative, alert, well developed, in no acute distress. HEENT: Conjunctivae and lids unremarkable. Cardiovascular: Regular rhythm.  Lungs: Normal work of breathing. Neurologic: No focal deficits.   Lab Results  Component Value Date   CREATININE 0.78 10/29/2019   BUN 11 10/29/2019   NA 138 10/29/2019   K 4.2 10/29/2019   CL 105 10/29/2019   CO2 28 10/29/2019   Lab Results  Component Value Date   ALT 58 (H) 10/29/2019   AST 44 (H) 10/29/2019   ALKPHOS 106 10/29/2019   BILITOT 0.4 10/29/2019   Lab Results  Component Value Date   HGBA1C 5.7 (H) 10/01/2019   HGBA1C 5.4 12/04/2018   HGBA1C 5.6 11/27/2017   HGBA1C 5.6 04/12/2017   HGBA1C 5.5 11/26/2016   Lab Results  Component Value Date   INSULIN 15.6 10/01/2019   Lab Results  Component Value Date   TSH 2.780 10/01/2019   Lab Results  Component Value Date  CHOL 171 10/01/2019   HDL 47 10/01/2019   LDLCALC 97 10/01/2019   LDLDIRECT 141.8 07/04/2007   TRIG 156 (H) 10/01/2019   CHOLHDL 3.6 10/01/2019   Lab Results  Component Value Date   WBC 4.4 10/29/2019   HGB 12.7 10/29/2019   HCT 38.8 10/29/2019   MCV 91.6 10/29/2019   PLT 193.0 10/29/2019   Lab Results  Component Value Date   IRON 89 10/01/2019   TIBC 335 10/01/2019   FERRITIN 178 (H) 10/01/2019    Obesity Behavioral Intervention Documentation for Insurance:   Approximately 15 minutes were spent on the discussion below.  ASK: We discussed the diagnosis of obesity with [name] today and Vivion agreed to give [place] permission to discuss obesity behavioral modification therapy today.  ASSESS: Gelisa has the diagnosis of obesity and her BMI today is 35.8. [place] is in the action stage of change.   ADVISE: Carleigh was educated on the multiple health risks of obesity as well as the benefit of weight loss to improve her health. She was advised of the need  for long term treatment and the importance of lifestyle modifications to improve her current health and to decrease her risk of future health problems.  AGREE: Multiple dietary modification options and treatment options were discussed and Arleene agreed to follow the recommendations documented in the above note.  ARRANGE: Emberlie was educated on the importance of frequent visits to treat obesity as outlined per CMS and USPSTF guidelines and agreed to schedule her next follow up appointment today.  Attestation Statements:   Reviewed by clinician on day of visit: allergies, medications, problem list, medical history, surgical history, family history, social history, and previous encounter notes.  I, Water quality scientist, CMA, am acting as Location manager for [employer], DO.  I have reviewed the above documentation for accuracy and completeness, and I agree with the above. Briscoe Deutscher, DO

## 2019-11-26 ENCOUNTER — Ambulatory Visit (INDEPENDENT_AMBULATORY_CARE_PROVIDER_SITE_OTHER): Payer: Medicare Other

## 2019-11-26 ENCOUNTER — Other Ambulatory Visit: Payer: Self-pay | Admitting: Rheumatology

## 2019-11-26 DIAGNOSIS — R002 Palpitations: Secondary | ICD-10-CM

## 2019-11-26 NOTE — Telephone Encounter (Signed)
Last Visit: 06/15/19 Next Visit: 12/08/19 UDS: 06/09/19 Narc Agreement: 06/09/19  Last Fill: 11/02/19  Okay to refill Tramadol?

## 2019-11-29 ENCOUNTER — Telehealth: Payer: Self-pay | Admitting: Family Medicine

## 2019-11-29 DIAGNOSIS — E039 Hypothyroidism, unspecified: Secondary | ICD-10-CM

## 2019-11-29 DIAGNOSIS — E781 Pure hyperglyceridemia: Secondary | ICD-10-CM

## 2019-11-29 DIAGNOSIS — I10 Essential (primary) hypertension: Secondary | ICD-10-CM

## 2019-11-29 DIAGNOSIS — E559 Vitamin D deficiency, unspecified: Secondary | ICD-10-CM

## 2019-11-29 DIAGNOSIS — R7303 Prediabetes: Secondary | ICD-10-CM

## 2019-11-29 NOTE — Telephone Encounter (Signed)
-----   Message from Ellamae Sia sent at 11/24/2019  2:56 PM EDT ----- Regarding: Lab orders for Monday, 4.19.21 Patient is scheduled for CPX labs, please order future labs, Thanks , Karna Christmas

## 2019-11-30 ENCOUNTER — Encounter: Payer: Self-pay | Admitting: Pulmonary Disease

## 2019-11-30 NOTE — Telephone Encounter (Signed)
Please let her know that letter for jury duty has been completed.

## 2019-11-30 NOTE — Progress Notes (Signed)
Office Visit Note  Patient: [name]             Date of Birth: [birth year]           MRN: [id number]             PCP: Abner Greenspan, MD Referring: Tower, [name], MD Visit Date: 12/08/2019 Occupation: @GUAROCC @  Subjective:  Right knee joint pain   History of Present Illness: [name] is a 69 y.o. female with history of fibromyalgia, osteoarthritis, and DDD.  Patient's been experiencing increased generalized arthralgias and myalgias over the past several months.  She is currently experiencing midthoracic spinal pain.  She does not want to proceed with x-rays at this time and plans on following up with her PCP later this week to further discuss.  She has trapezius muscle tension and muscle spasms intermittently.  She states that the right trochanter bursitis has improved.  She states her neck pain is also been improving.  She denies any joint swelling in her hands at this time.  She continues to have chronic right knee joint pain.  The pain has been severe recently.  She has been taking tramadol 50 mg 1 tablet twice daily as needed for pain relief.  Patient does not feel that tramadol has been controlling her pain.  She continues to experience nocturnal pain as well as nocturia which has interrupted sleep at night.  She takes Ambien 10 mg by mouth at bedtime to help her sleep. She has been going to the [employer] loss clinic and has started to lose weight.  She states that she was started on Metformin due to being diagnosed with prediabetes.   Activities of Daily Living:  Patient reports morning stiffness for 5 minutes.   Patient Reports nocturnal pain.  Difficulty dressing/grooming: Denies Difficulty climbing stairs: Reports Difficulty getting out of chair: Reports Difficulty using hands for taps, buttons, cutlery, and/or writing: Reports  Review of Systems  Constitutional: Positive for fatigue.  HENT: Positive for mouth dryness and nose dryness. Negative for mouth  sores.   Eyes: Negative for pain, itching, visual disturbance and dryness.  Respiratory: Positive for difficulty breathing. Negative for cough, hemoptysis and shortness of breath.   Cardiovascular: Negative for chest pain, palpitations, hypertension and swelling in legs/feet.  Gastrointestinal: Positive for constipation. Negative for blood in stool and diarrhea.  Endocrine: Negative for increased urination.  Genitourinary: Positive for nocturia. Negative for difficulty urinating and painful urination.  Musculoskeletal: Positive for arthralgias, joint pain, joint swelling and morning stiffness. Negative for myalgias, muscle weakness, muscle tenderness and myalgias.  Skin: Negative for color change, pallor, rash, hair loss, nodules/bumps, redness, skin tightness, ulcers and sensitivity to sunlight.  Allergic/Immunologic: Negative for susceptible to infections.  Neurological: Positive for headaches. Negative for numbness, memory loss and weakness.  Hematological: Negative for bruising/bleeding tendency and swollen glands.  Psychiatric/Behavioral: Negative for depressed mood, confusion and sleep disturbance. The patient is not nervous/anxious.     PMFS History:  Patient Active Problem List   Diagnosis Date Noted  . Bronchiectasis with (acute) exacerbation ([place]) 10/23/2019  . Elevated LFTs 10/15/2019  . Lower abdominal pain 09/01/2019  . Neck pain 10/25/2017  . Paresthesias in left hand 10/25/2017  . Paresthesia of foot, bilateral 04/12/2017  . Primary osteoarthritis of both hands 09/27/2016  . Primary osteoarthritis of both knees 09/27/2016  . 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  . Nasopharyngitis, 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/2016  . S/P total knee replacement 02/13/2016  .  Chronic pain of both knees 12/06/2015  . Globus pharyngeus 05/17/2015  . Genetic testing 12/23/2014  . Family history of breast cancer   . Family history of colon cancer   . Family history of pancreatic cancer   . History of  breast cancer 11/23/2014  . Allergy to ACE inhibitors 09/27/2014  . Prediabetes 06/23/2013  . RLS (restless legs syndrome) 01/15/2013  . Hx of Clostridium difficile infection 10/10/2012  . Dyspnea on exertion 04/02/2012  . NICM (nonischemic cardiomyopathy) ([place]) 03/07/2012  . Post-menopausal 01/11/2012  . Obstructive sleep apnea 04/04/2010  . V-tach ([place]) 01/03/2009  . ICD  [employer]  Single chamber 01/03/2009  . PFO (patent foramen ovale) 09/29/2008  . Vitamin D deficiency 08/17/2008  . Hypothyroidism 11/18/2006  . Hyperlipidemia 11/18/2006  . Depression with anxiety 11/18/2006  . Essential hypertension 11/18/2006  . Mitral valve prolapse 11/18/2006  . Cerebral aneurysm 11/18/2006  . Allergic rhinitis 11/18/2006  . GERD 11/18/2006  . Diverticulosis of colon 11/18/2006  . Fatty liver 11/18/2006  . Fibromyalgia 11/18/2006    Past Medical History:  Diagnosis Date  . AICD (automatic cardioverter/defibrillator) present   . Anxiety   . Arthritis   . Back pain   . 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 Bronchiectasis ([place])   . Cerebral aneurysm, nonruptured    had a clip put in  . CHF (congestive heart failure) ([place])   . Clostridium difficile infection   . Depressive disorder, not elsewhere classified   . Diverticulosis of colon (without mention of hemorrhage)   . Esophageal reflux   . Fatty liver   . Fibromyalgia   . Gastritis   . GERD (gastroesophageal reflux disease)   . GI bleed 2004  . Glaucoma   . Hiatal hernia   . Hyperlipidemia   . Hypertension   . Hypothyroidism   . Internal hemorrhoids   . Joint pain   . Obstructive sleep apnea (adult) (pediatric)   . Osteoarthritis   . Ostium secundum type atrial septal defect   . Other chronic nonalcoholic liver disease   . Other pulmonary embolism and infarction   . Palpitations   . Paroxysmal ventricular tachycardia ([place])   . Personal history of radiation therapy   . Pneumonia     history  . Presence of permanent cardiac pacemaker   . PUD (peptic ulcer disease)   . Radiation 02/03/15-03/10/15   Right Breast  . Sarcoid    per pt , not sure  . Schatzki's ring   . Shortness of breath   . Sleep apnea   . Stroke [facility]) 2013   tia/ pt feels it was around 2008 0r 2009  . Takotsubo syndrome   . Tubular adenoma of colon   . Unspecified transient cerebral ischemia   . Unspecified vitamin D deficiency     Family History  Problem Relation Age of Onset  . Diabetes Mother   . Alzheimer's disease Mother   . Hypertension Mother   . Obesity Mother   . Other Brother        Thyroid problem 10/2016  . Allergies Sister   . Heart disease Sister   . Heart disease Brother   . Prostate cancer Brother 77       same brother as throat cancer  . Throat cancer Brother        dx in his 68s; also a smoker  . Heart disease Father   . Cancer Maternal Grandmother 26  colon cancer or abdominal cancer  . Lung cancer Maternal Grandfather 83  . Breast cancer Maternal Aunt 32  . Colon cancer Maternal Aunt 73       same sister as breast at 66  . Lung cancer Maternal Uncle   . Breast cancer Maternal Aunt        dx in her 42s  . Pancreatic cancer Cousin 23       maternal first cousin  . Breast cancer Cousin        paternal first cousin twice removed died in her 47s  . Heart disease Son        Cardiac Arrest 07/2016  . Stroke Neg Hx    Past Surgical History:  Procedure Laterality Date  . ABI  2006   normal  . BREAST EXCISIONAL BIOPSY    . BREAST LUMPECTOMY WITH RADIOACTIVE SEED LOCALIZATION Right 01/03/2015   Procedure: BREAST LUMPECTOMY WITH RADIOACTIVE SEED LOCALIZATION;  Surgeon: Autumn Messing III, MD;  Location: [facility];  Service: General;  Laterality: Right;  . CARDIAC DEFIBRILLATOR PLACEMENT  2006; 2012   BSX single chamber ICD  . carotid dopplers  2006   neg  . CEREBRAL ANEURYSM REPAIR  02/1999  . COLONOSCOPY    . hospitalization  2004   GI bleed, PUD, diverticulosis  (EGD,colonscopy)  . hospitalization     PE, NSVT, s/p defib  . KNEE ARTHROSCOPY     bilateral  . LEFT HEART CATHETERIZATION WITH CORONARY ANGIOGRAM N/A 02/22/2012   Procedure: LEFT HEART CATHETERIZATION WITH CORONARY ANGIOGRAM;  Surgeon: Burnell Blanks, MD;  Location: [facility] CATH LAB;  Service: Cardiovascular;  Laterality: N/A;  . PARTIAL HYSTERECTOMY     Fibroids  . TONGUE BIOPSY  12/12/2017   due to sore tongue and white patches/abnormal cells  . TOTAL KNEE ARTHROPLASTY Left 02/13/2016   Procedure: TOTAL KNEE ARTHROPLASTY;  Surgeon: Vickey Huger, MD;  Location: [place];  Service: Orthopedics;  Laterality: Left;  . UPPER GASTROINTESTINAL ENDOSCOPY     Social History   Social History Narrative   Lives alone   Caffeine [id number]   Retired from [employer]   2 children   Divorced   Right handed    Immunization History  Administered Date(s) Administered  . Fluad Quad(high Dose 65+) 04/17/2019  . H1N1 07/24/2008  . Influenza Split 07/17/2011, 09/27/2011, 04/29/2012  . Influenza Whole 07/20/2004, 07/04/2007, 05/07/2008, 05/19/2009, 05/17/2010  . Influenza, High Dose Seasonal PF 05/30/2017, 05/21/2018  . Influenza,inj,Quad PF,6+ Mos 05/20/2013, 05/06/2014, 05/17/2015, 05/15/2016  . Influenza-Unspecified 05/07/2018  . PFIZER SARS-COV-2 Vaccination 09/25/2019, 10/16/2019  . Pneumococcal Conjugate-13 11/22/2015  . Pneumococcal Polysaccharide-23 05/20/2002, 05/07/2008, 06/25/2014  . Td 09/07/2009  . Zoster 09/21/2011     Objective: Vital Signs: BP 116/67 (BP Location: Left Wrist, Patient Position: Sitting, Cuff Size: Normal)   Pulse 63   Resp 15   Ht 5' 6.5" (1.689 m)   Wt 224 lb 9.6 oz (101.9 kg)   LMP  (LMP Unknown)   BMI 35.71 kg/m    Physical Exam Vitals and nursing note reviewed.  Constitutional:      Appearance: She is well-developed.  HENT:     Head: Normocephalic and atraumatic.  Eyes:     Conjunctiva/sclera: Conjunctivae normal.  Pulmonary:     Effort: Pulmonary  effort is normal.  Abdominal:     General: Bowel sounds are normal.     Palpations: Abdomen is soft.  Musculoskeletal:     Cervical back: Normal range of motion.  Lymphadenopathy:  Cervical: No cervical adenopathy.  Skin:    General: Skin is warm and dry.     Capillary Refill: Capillary refill takes less than 2 seconds.  Neurological:     Mental Status: She is alert and oriented to person, place, and time.  Psychiatric:        Behavior: Behavior normal.      Musculoskeletal Exam: Generalized hyperalgesia and positive tender points on exam.  C-spine, thoracic spine, lumbar spine have good range of motion.  She has midline spinal tenderness in thoracic region.  Tenderness over bilateral SI joints.  Shoulder joints, elbow joints, wrist joints, MCPs, PIPs and DIPs good range of motion with no synovitis.  She has mild osteoarthritic changes in both hands.  Hip joints have good range of motion with no discomfort at this time.  Left knee replacement has good range of motion with no discomfort.  Right knee crepitus noted.  No warmth or effusion of the right knee joint noted.  She has a Baker's cyst palpable in the popliteal fossa of the right knee.  Ankle joints have good range of motion with no tenderness or synovitis.  CDAI Exam: CDAI Score: - Patient Global: -; Provider Global: - Swollen: -; Tender: - Joint Exam 12/08/2019   No joint exam has been documented for this visit   There is currently no information documented on the homunculus. Go to the Rheumatology activity and complete the homunculus joint exam.  Investigation: No additional findings.  Imaging: CT Abdomen Pelvis Wo Contrast  Result Date: 11/18/2019 CLINICAL DATA:  Right lower quadrant pain, history of known hydrosalpinx and anterior abdominal wall hernia EXAM: CT ABDOMEN AND PELVIS WITHOUT CONTRAST TECHNIQUE: Multidetector CT imaging of the abdomen and pelvis was performed following the standard protocol without IV  contrast. COMPARISON:  02/11/2018 FINDINGS: Lower chest: Lung bases again demonstrate patchy opacity bilaterally likely representing some mild edema superimposed on more chronic fibrotic changes. Heart is enlarged with defibrillator device stable in appearance. Hepatobiliary: No focal liver abnormality is seen. No gallstones, gallbladder wall thickening, or biliary dilatation. Pancreas: Unremarkable. No pancreatic ductal dilatation or surrounding inflammatory changes. Spleen: Normal in size without focal abnormality. Adrenals/Urinary Tract: Adrenal glands are unremarkable. Kidneys are well visualized bilaterally without renal calculi or obstructive changes. The ureters are within normal limits. The bladder is within normal limits. Stomach/Bowel: Diverticulosis of the colon is seen. No findings to suggest diverticulitis are noted. Infraumbilical anterior wall hernia is again identified with a loop of transverse colon within although no obstructive changes are noted. The appendix is within normal limits. No small bowel abnormality is seen. The stomach is unremarkable. Vascular/Lymphatic: Aortic atherosclerosis. No enlarged abdominal or pelvic lymph nodes. Reproductive: Uterus has been surgically removed. The known right-sided hydrosalpinx is again identified and stable. No adnexal mass is noted. Other: No abdominal wall hernia or abnormality. No abdominopelvic ascites. Musculoskeletal: Degenerative changes of the lumbar spine are noted. IMPRESSION: Stable right hydrosalpinx similar to that seen on the prior exam. Stable infraumbilical anterior wall hernia containing a loop of transverse colon. No obstructive changes are noted. Diverticulosis without diverticulitis. Chronic fibrotic changes in the bases with some superimposed edema. This is stable from the recent exam dated 10/23/2019 Electronically Signed   By: [name] M.D.   On: 11/18/2019 22:39   CUP PACEART INCLINIC DEVICE CHECK  Result Date: 11/16/2019  ICD check in clinic. Normal device function. Thresholds and sensing consistent with previous device measurements. Impedance trends stable over time. Known NSVT noted. Last episodes  06/2019. Histogram distribution appropriate for patient and level of activity. No changes made this session. Device programmed at appropriate safety margins. Estimated longevity 3.5 years. Pt enrolled in remote follow-up.  CUP PACEART REMOTE DEVICE CHECK  Result Date: 12/02/2019 Scheduled remote reviewed. Normal device function.  Next remote 91 days. Felisa Bonier, RN, MSN   Recent Labs: Lab Results  Component Value Date   WBC 3.5 (L) 12/07/2019   HGB 12.8 12/07/2019   PLT 178.0 12/07/2019   NA 138 12/07/2019   K 4.2 12/07/2019   CL 105 12/07/2019   CO2 29 12/07/2019   GLUCOSE 101 (H) 12/07/2019   BUN 10 12/07/2019   CREATININE 0.77 12/07/2019   BILITOT 0.5 12/07/2019   ALKPHOS 102 12/07/2019   AST 37 12/07/2019   ALT 41 (H) 12/07/2019   PROT 6.8 12/07/2019   ALBUMIN 4.2 12/07/2019   CALCIUM 9.6 12/07/2019   GFRAA 82 10/01/2019    Speciality Comments: Narc Agreement: 01/16/18  Procedures:  Large Joint Inj: R knee on 12/08/2019 1:35 PM Indications: pain Details: 27 G 1.5 in needle, medial approach  Arthrogram: No  Medications: 1.5 mL lidocaine 1 %; 40 mg triamcinolone acetonide 40 MG/ML Aspirate: 0 mL Outcome: tolerated well, no immediate complications Procedure, treatment alternatives, risks and benefits explained, specific risks discussed. Consent was given by the patient. Immediately prior to procedure a time out was called to verify the correct patient, procedure, equipment, support staff and site/side marked as required. Patient was prepped and draped in the usual sterile fashion.     Allergies: Ace inhibitors, Amitriptyline hcl, Atorvastatin, Cymbalta [duloxetine hcl], Dilantin [phenytoin sodium extended], Paroxetine, Ramipril, Rosuvastatin, Carbamazepine, Codeine, Iodine-131, Phenytoin sodium  extended, and Wellbutrin [bupropion]   Assessment / Plan:     Visit Diagnoses: Fibromyalgia: She has generalized hyperalgesia and positive tender points on exam.  She has been having more frequent and severe fibromyalgia flares recently.  She perform virtual physical therapy was provided significant pain relief.  She is been experiencing increased thoracic spine pain.  She has midline spinal tenderness in the thoracic region.  She declined x-rays today. she would like to further discuss with her PCP. She continues take tramadol 50 mg 1 tablet twice daily as needed for pain relief.  We discussed importance of regular exercise and good sleep hygiene.  She will follow up in 6 months.   Trapezius muscle spasm: She experiences intermittent trapezius muscle spasms.  She has tenderness and muscle tension on exam today.  Medication monitoring encounter -UDS and narcotic agreement were updated today on 12/08/2019.  She has been taking tramadol 50 mg 1 tablet twice daily as needed for pain relief.  She does not feel as though this has been controlling her discomfort.  A referral to pain management was placed today.  Plan: Pain Mgmt, Profile 5 w/Conf, U, Pain Mgmt, Tramadol w/medMATCH, U  Chronic pain of right knee - She presents today with increased right knee joint pain.  She has good range of motion of the right knee on exam.  No warmth or effusion was noted.  She has right knee crepitus.  Baker's cyst palpable in the popliteal fossa.  X-rays of the right knee joint were obtained today.  She requested a right knee joint cortisone injection. She has started going to the [employer] loss clinic which she has found to be helpful.  We discussed the importance of regular exercise and weight loss.  Plan: XR KNEE 3 VIEW RIGHT  Primary osteoarthritis of right  knee -She has chronic right knee joint pain.  X-rays of the right knee joint were obtained today.  She requested a right knee joint cortisone injection.   Tolerated procedure well.  The procedure note was completed above.  Aftercare was discussed.  She was given a handout of knee joint exercises to perform.  Plan: XR KNEE 3 VIEW RIGHT, x-ray findings were consistent with severe osteoarthritis and severe chondromalacia patella.  She would benefit from total knee replacement.  Ambulatory referral to Pain Clinic  Status post total knee replacement, left: Doing well.  She has good range of motion with no discomfort at this time.  Primary osteoarthritis of both hands - She has mild osteoarthritic changes in both hands.  No tenderness or synovitis was noted on exam today.  She has complete fist formation bilaterally.  Joint protection and muscle strengthening were discussed.  Plan: Ambulatory referral to Pain Clinic  Trochanteric bursitis, right hip  DDD (degenerative disc disease), cervical -according to the patient her neck pain and stiffness has improved.  She performs neck exercises on a regular basis.  She is not experiencing any symptoms of radiculopathy at this time.  Plan: Ambulatory referral to Pain Clinic  Plantar fasciitis, right: Resolved  Other medical conditions are listed as follows:  History of gastroesophageal reflux (GERD)  History of hypertension  Brain aneurysm  History of hypothyroidism  History of diverticulosis  History of breast cancer  History of hypercholesterolemia  Hx of Clostridium difficile infection  History of TIA (transient ischemic attack)  RLS (restless legs syndrome)  History of depression  History of migraine  History of sleep apnea    Orders: Orders Placed This Encounter  Procedures  . Large Joint Inj  . XR KNEE 3 VIEW RIGHT  . Pain Mgmt, Profile 5 w/Conf, U  . Pain Mgmt, Tramadol w/medMATCH, U  . Ambulatory referral to Pain Clinic   No orders of the defined types were placed in this encounter.   Face-to-face time spent with patient was 30 minutes. Greater than 50% of time was spent  in counseling and coordination of care.  Follow-Up Instructions: Return in about 6 months (around 06/08/2020) for Fibromyalgia, Osteoarthritis.   Ofilia Neas, PA-C  Note - This record has been created using Dragon software.  Chart creation errors have been sought, but may not always  have been located. Such creation errors do not reflect on  the standard of medical care.

## 2019-12-02 ENCOUNTER — Ambulatory Visit (INDEPENDENT_AMBULATORY_CARE_PROVIDER_SITE_OTHER): Payer: Medicare Other | Admitting: *Deleted

## 2019-12-02 DIAGNOSIS — I428 Other cardiomyopathies: Secondary | ICD-10-CM | POA: Diagnosis not present

## 2019-12-02 LAB — CUP PACEART REMOTE DEVICE CHECK
Battery Remaining Longevity: 42 mo
Battery Remaining Percentage: 46 %
Brady Statistic RV Percent Paced: 0 %
Date Time Interrogation Session: 20210414031400
HighPow Impedance: 73 Ohm
Implantable Lead Implant Date: 20050603
Implantable Lead Location: 753860
Implantable Lead Model: 185
Implantable Lead Serial Number: 116340
Implantable Pulse Generator Implant Date: 20110505
Lead Channel Impedance Value: 657 Ohm
Lead Channel Pacing Threshold Amplitude: 0.7 V
Lead Channel Pacing Threshold Pulse Width: 0.4 ms
Lead Channel Setting Pacing Amplitude: 2.4 V
Lead Channel Setting Pacing Pulse Width: 0.4 ms
Lead Channel Setting Sensing Sensitivity: 0.4 mV
Pulse Gen Serial Number: 266301

## 2019-12-02 NOTE — Progress Notes (Signed)
ICD Remote  

## 2019-12-02 NOTE — Telephone Encounter (Signed)
Spoke with patient. She is aware that the letter is ready for pickup. Letter will be placed up front.   Nothing further needed at time of call.

## 2019-12-04 ENCOUNTER — Ambulatory Visit: Payer: Medicare Other

## 2019-12-07 ENCOUNTER — Other Ambulatory Visit (INDEPENDENT_AMBULATORY_CARE_PROVIDER_SITE_OTHER): Payer: Medicare Other

## 2019-12-07 ENCOUNTER — Other Ambulatory Visit: Payer: Self-pay

## 2019-12-07 ENCOUNTER — Ambulatory Visit (INDEPENDENT_AMBULATORY_CARE_PROVIDER_SITE_OTHER): Payer: Medicare Other

## 2019-12-07 VITALS — Wt 226.0 lb

## 2019-12-07 DIAGNOSIS — E039 Hypothyroidism, unspecified: Secondary | ICD-10-CM

## 2019-12-07 DIAGNOSIS — Z0000 Encounter for general adult medical examination without abnormal findings: Secondary | ICD-10-CM

## 2019-12-07 DIAGNOSIS — R7303 Prediabetes: Secondary | ICD-10-CM | POA: Diagnosis not present

## 2019-12-07 DIAGNOSIS — I10 Essential (primary) hypertension: Secondary | ICD-10-CM

## 2019-12-07 DIAGNOSIS — E781 Pure hyperglyceridemia: Secondary | ICD-10-CM | POA: Diagnosis not present

## 2019-12-07 DIAGNOSIS — E559 Vitamin D deficiency, unspecified: Secondary | ICD-10-CM

## 2019-12-07 LAB — COMPREHENSIVE METABOLIC PANEL
ALT: 41 U/L — ABNORMAL HIGH (ref 0–35)
AST: 37 U/L (ref 0–37)
Albumin: 4.2 g/dL (ref 3.5–5.2)
Alkaline Phosphatase: 102 U/L (ref 39–117)
BUN: 10 mg/dL (ref 6–23)
CO2: 29 mEq/L (ref 19–32)
Calcium: 9.6 mg/dL (ref 8.4–10.5)
Chloride: 105 mEq/L (ref 96–112)
Creatinine, Ser: 0.77 mg/dL (ref 0.40–1.20)
GFR: 89.37 mL/min (ref >60.00)
Glucose, Bld: 101 mg/dL — ABNORMAL HIGH (ref 70–99)
Potassium: 4.2 mEq/L (ref 3.5–5.1)
Sodium: 138 mEq/L (ref 135–145)
Total Bilirubin: 0.5 mg/dL (ref 0.2–1.2)
Total Protein: 6.8 g/dL (ref 6.0–8.3)

## 2019-12-07 LAB — CBC WITH DIFFERENTIAL/PLATELET
Basophils Absolute: 0 10*3/uL (ref 0.0–0.1)
Basophils Relative: 0.8 % (ref 0.0–3.0)
Eosinophils Absolute: 0.2 10*3/uL (ref 0.0–0.7)
Eosinophils Relative: 6.3 % — ABNORMAL HIGH (ref 0.0–5.0)
HCT: 39 % (ref 36.0–46.0)
Hemoglobin: 12.8 g/dL (ref 12.0–15.0)
Lymphocytes Relative: 27.6 % (ref 12.0–46.0)
Lymphs Abs: 1 10*3/uL (ref 0.7–4.0)
MCHC: 32.8 g/dL (ref 30.0–36.0)
MCV: 91.4 fl (ref 78.0–100.0)
Monocytes Absolute: 0.4 10*3/uL (ref 0.1–1.0)
Monocytes Relative: 12.2 % — ABNORMAL HIGH (ref 3.0–12.0)
Neutro Abs: 1.9 10*3/uL (ref 1.4–7.7)
Neutrophils Relative %: 53.1 % (ref 43.0–77.0)
Platelets: 178 10*3/uL (ref 150.0–400.0)
RBC: 4.27 Mil/uL (ref 3.87–5.11)
RDW: 13.1 % (ref 11.5–15.5)
WBC: 3.5 10*3/uL — ABNORMAL LOW (ref 4.0–10.5)

## 2019-12-07 LAB — HEMOGLOBIN A1C: Hgb A1c MFr Bld: 5.3 % (ref 4.6–6.5)

## 2019-12-07 LAB — LIPID PANEL
Cholesterol: 147 mg/dL (ref 0–200)
HDL: 46.6 mg/dL (ref >39.00)
LDL Cholesterol: 77 mg/dL (ref 0–99)
NonHDL: 100.51
Total CHOL/HDL Ratio: 3
Triglycerides: 117 mg/dL (ref 0.0–149.0)
VLDL: 23.4 mg/dL (ref 0.0–40.0)

## 2019-12-07 LAB — TSH: TSH: 2.56 u[IU]/mL (ref 0.35–4.50)

## 2019-12-07 LAB — VITAMIN D 25 HYDROXY (VIT D DEFICIENCY, FRACTURES): VITD: 61.53 ng/mL (ref 30.00–100.00)

## 2019-12-07 NOTE — Progress Notes (Addendum)
PCP notes:  Health Maintenance: Tdap- insurance/financial Dexa- due?   Abnormal Screenings: none   Patient concerns: Needs refill son medications Discuss back and abdominal pain Needs referral to a primary cardiologist   Nurse concerns: none   Next PCP appt.: 12/10/2019 @ 12:15 pm   I reviewed health advisor's note, was available for consultation, and agree with documentation and plan. Loura Pardon MD

## 2019-12-07 NOTE — Progress Notes (Signed)
Subjective:   TATE LOGALBO is a 69 y.o. female who presents for Medicare Annual (Subsequent) preventive examination.  Review of Systems: N/A   This visit is being conducted through telemedicine via telephone at the nurse health advisor's home address due to the COVID-19 pandemic. This patient has given me verbal consent via doximity to conduct this visit, patient states they are participating from their home address. Patient and myself are on the telephone call. There is no referral for this visit. Some vital signs may be absent or patient reported.    Patient identification: identified by name, DOB, and current address   Cardiac Risk Factors include: advanced age (>22men, >2 women);dyslipidemia;hypertension     Objective:     Vitals: Wt 226 lb (102.5 kg)   LMP  (LMP Unknown)   BMI 36.48 kg/m   Body mass index is 36.48 kg/m.  Advanced Directives 12/07/2019 07/22/2019 12/04/2018 01/06/2018 11/27/2017 11/26/2016 02/13/2016  Does Patient Have a Medical Advance Directive? Yes Yes Yes Yes Yes Yes No  Type of Paramedic of [place];Living will Healthcare Power of [place];Living will Healthcare Power of [place];Living will [place];Living will -  Does patient want to make changes to medical advance directive? - No - Patient declined - - - - -  Copy of [place] in Chart? No - copy requested - No - copy requested - No - copy requested No - copy requested -  Would patient like information on creating a medical advance directive? - - - - - - No - patient declined information    Tobacco Social History   Tobacco Use  Smoking Status Former Smoker  . Packs/day: 1.00  . Years: 15.00  . Pack years: 15.00  . Types: Cigarettes  . Quit date: 08/20/1989  . Years since quitting: 30.3  Smokeless Tobacco Never Used  Tobacco Comment   Started at 29; less than 1 PPD       Counseling given: Not Answered Comment: Started at 89; less than 1 PPD   Clinical Intake:  Pre-visit preparation completed: Yes  Pain : 0-10 Pain Score: 3  Pain Type: Chronic pain Pain Location: (back and knees) Pain Orientation: Right, Left Pain Descriptors / Indicators: Aching Pain Onset: More than a month ago Pain Frequency: Constant     Nutritional Risks: None Diabetes: No  How often do you need to have someone help you when you read instructions, pamphlets, or other written materials from your doctor or pharmacy?: 1 - Never What is the last grade level you completed in school?: community college  Interpreter Needed?: No  Information entered by :: CJOhnson, LPN  Past Medical History:  Diagnosis Date  . AICD (automatic cardioverter/defibrillator) present   . Anxiety   . Arthritis   . Back pain   . 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 Bronchiectasis ([place])   . Cerebral aneurysm, nonruptured    had a clip put in  . CHF (congestive heart failure) ([place])   . Clostridium difficile infection   . Depressive disorder, not elsewhere classified   . Diverticulosis of colon (without mention of hemorrhage)   . Esophageal reflux   . Fatty liver   . Fibromyalgia   . Gastritis   . GERD (gastroesophageal reflux disease)   . GI bleed 2004  . Glaucoma   . Hiatal hernia   . Hyperlipidemia   .  Hypertension   . Hypothyroidism   . Internal hemorrhoids   . Joint pain   . Obstructive sleep apnea (adult) (pediatric)   . Osteoarthritis   . Ostium secundum type atrial septal defect   . Other chronic nonalcoholic liver disease   . Other pulmonary embolism and infarction   . Palpitations   . Paroxysmal ventricular tachycardia ([place])   . Personal history of radiation therapy   . Pneumonia    history  . Presence of permanent cardiac pacemaker   . PUD (peptic ulcer disease)   . Radiation 02/03/15-03/10/15   Right Breast  . Sarcoid    per pt , not  sure  . Schatzki's ring   . Shortness of breath   . Sleep apnea   . Stroke [facility]) 2013   tia/ pt feels it was around 2008 0r 2009  . Takotsubo syndrome   . Tubular adenoma of colon   . Unspecified transient cerebral ischemia   . Unspecified vitamin D deficiency    Past Surgical History:  Procedure Laterality Date  . ABI  2006   normal  . BREAST EXCISIONAL BIOPSY    . BREAST LUMPECTOMY WITH RADIOACTIVE SEED LOCALIZATION Right 01/03/2015   Procedure: BREAST LUMPECTOMY WITH RADIOACTIVE SEED LOCALIZATION;  Surgeon: Autumn Messing III, MD;  Location: [facility];  Service: General;  Laterality: Right;  . CARDIAC DEFIBRILLATOR PLACEMENT  2006; 2012   BSX single chamber ICD  . carotid dopplers  2006   neg  . CEREBRAL ANEURYSM REPAIR  02/1999  . COLONOSCOPY    . hospitalization  2004   GI bleed, PUD, diverticulosis (EGD,colonscopy)  . hospitalization     PE, NSVT, s/p defib  . KNEE ARTHROSCOPY     bilateral  . LEFT HEART CATHETERIZATION WITH CORONARY ANGIOGRAM N/A 02/22/2012   Procedure: LEFT HEART CATHETERIZATION WITH CORONARY ANGIOGRAM;  Surgeon: Burnell Blanks, MD;  Location: [facility] CATH LAB;  Service: Cardiovascular;  Laterality: N/A;  . PARTIAL HYSTERECTOMY     Fibroids  . TONGUE BIOPSY  12/12/2017   due to sore tongue and white patches/abnormal cells  . TOTAL KNEE ARTHROPLASTY Left 02/13/2016   Procedure: TOTAL KNEE ARTHROPLASTY;  Surgeon: Vickey Huger, MD;  Location: [facility];  Service: Orthopedics;  Laterality: Left;  . UPPER GASTROINTESTINAL ENDOSCOPY     Family History  Problem Relation Age of Onset  . Diabetes Mother   . Alzheimer's disease Mother   . Hypertension Mother   . Obesity Mother   . Other Brother        Thyroid problem 10/2016  . Allergies Sister   . Heart disease Sister   . Heart disease Brother   . Prostate cancer Brother 32       same brother as throat cancer  . Throat cancer Brother        dx in his 46s; also a smoker  . Heart disease Father   . Cancer Maternal  Grandmother 70       colon cancer or abdominal cancer  . Lung cancer Maternal Grandfather 89  . Breast cancer Maternal Aunt 24  . Colon cancer Maternal Aunt 58       same sister as breast at 47  . Lung cancer Maternal Uncle   . Breast cancer Maternal Aunt        dx in her 19s  . Pancreatic cancer Cousin 74       maternal first cousin  . Breast cancer Cousin  paternal first cousin twice removed died in her 54s  . Cardiomyopathy Other        Family history  . Heart disease Son        Cardiac Arrest 07/2016  . Stroke Neg Hx    Social History   Socioeconomic History  . Marital status: Divorced    Spouse name: Not on file  . Number of children: 2  . Years of education: 26  . Highest education level: Not on file  Occupational History  . Occupation: DISABLED  Tobacco Use  . Smoking status: Former Smoker    Packs/day: 1.00    Years: 15.00    Pack years: 15.00    Types: Cigarettes    Quit date: 08/20/1989    Years since quitting: 30.3  . Smokeless tobacco: Never Used  . Tobacco comment: Started at 83; less than 1 PPD  Substance and Sexual Activity  . Alcohol use: Yes    Alcohol/week: 0.0 standard drinks    Comment: rare  . Drug use: No  . Sexual activity: Not Currently  Other Topics Concern  . Not on file  Social History Narrative   Lives alone   Caffeine [id number]   Retired from [employer]   2 children   Divorced   Right handed    Social Determinants of Health   Financial Resource Strain: Low Risk   . Difficulty of Paying Living Expenses: Not hard at all  Food Insecurity: No Food Insecurity  . Worried About Charity fundraiser in the Last Year: Never true  . Ran Out of Food in the Last Year: Never true  Transportation Needs: No Transportation Needs  . Lack of Transportation (Medical): No  . Lack of Transportation (Non-Medical): No  Physical Activity: Inactive  . Days of Exercise per Week: 0 days  . Minutes of Exercise per Session: 0 min  Stress: Stress  Concern Present  . Feeling of Stress : To some extent  Social Connections:   . Frequency of Communication with Friends and Family:   . Frequency of Social Gatherings with Friends and Family:   . Attends Religious Services:   . Active Member of Clubs or Organizations:   . Attends Archivist Meetings:   [name] Marital Status:     Outpatient Encounter Medications as of 12/07/2019  Medication Sig  . albuterol (PROVENTIL) (2.5 MG/3ML) 0.083% nebulizer solution USE 1 VIAL IN NEBULIZER EVERY 6 HOURS - and as needed  . albuterol (VENTOLIN HFA) 108 (90 Base) MCG/ACT inhaler Inhale 2 puffs into the lungs every 6 (six) hours as needed for wheezing or shortness of breath.  . Ascorbic Acid (VITAMIN C) 1000 MG tablet Take 1,000 mg by mouth daily.  [name] aspirin 81 MG tablet Take 81 mg by mouth daily.  . baclofen (LIORESAL) 10 MG tablet Take 10 mg by mouth at bedtime.  . calcium carbonate (OS-CAL - DOSED IN MG OF ELEMENTAL CALCIUM) 1250 (500 Ca) MG tablet Take 1 tablet by mouth.  . carvedilol (COREG) 12.5 MG tablet TAKE 1 TABLET TWICE DAILY  . cholecalciferol (VITAMIN D) 1000 UNITS tablet Take 5,000 Units by mouth daily.   . fexofenadine (ALLEGRA) 180 MG tablet Take 180 mg by mouth daily as needed for allergies or rhinitis.  . fluticasone (FLONASE) 50 MCG/ACT nasal spray Place 2 sprays into both nostrils daily as needed for allergies or rhinitis.  . furosemide (LASIX) 40 MG tablet Take 20 mg by mouth daily. 20-40mg   . hydroxypropyl methylcellulose /  hypromellose (ISOPTO TEARS / GONIOVISC) 2.5 % ophthalmic solution Place 1 drop into both eyes daily.  [name] latanoprost (XALATAN) 0.005 % ophthalmic solution Place 1 drop into both eyes at bedtime.  [name] levothyroxine (SYNTHROID) 50 MCG tablet Take 1 tablet (50 mcg total) by mouth daily.  . Magnesium Oxide 400 (240 Mg) MG TABS Take 1 tablet (400 mg total) by mouth daily.  . metFORMIN (GLUCOPHAGE) 500 MG tablet Take 1 tablet (500 mg total) by mouth 2 (two) times  daily.  . mometasone (ELOCON) 0.1 % lotion Apply 1 application topically daily as needed (rash).   . montelukast (SINGULAIR) 10 MG tablet TAKE 1 TABLET AT BEDTIME  . Multiple Vitamins-Minerals (MULTIVITAMIN PO) Take 1 tablet by mouth daily.  . pantoprazole (PROTONIX) 40 MG tablet Take 1 tablet (40 mg total) by mouth daily.  . polyethylene glycol powder (GLYCOLAX/MIRALAX) powder Take 17 grams by mouth daily  . potassium chloride (K-DUR) 10 MEQ tablet TAKE 1/2 TABLET (5MEQ TOTAL) EVERY DAY  . Respiratory Therapy Supplies (FLUTTER) DEVI 1 Device by Does not apply route as needed.  . simvastatin (ZOCOR) 40 MG tablet TAKE 1/2 TABLET (20MG ) EVERY DAY  . sodium chloride HYPERTONIC 3 % nebulizer solution Take by nebulization 2 (two) times daily as needed for cough. 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  . traMADol (ULTRAM) 50 MG tablet TAKE 1 TABLET BY MOUTH THREE TIMES DAILY AS NEEDED FOR NECK PAIN.  [name] zinc gluconate 50 MG tablet Take 50 mg by mouth daily.  [name] zolpidem (AMBIEN) 10 MG tablet TAKE 1 TABLET BY MOUTH AT BEDTIME AS NEEDED FOR SLEEP   No facility-administered encounter medications on file as of 12/07/2019.    Activities of Daily Living In your present state of health, do you have any difficulty performing the following activities: 12/07/2019  Hearing? N  Vision? N  Difficulty concentrating or making decisions? N  Walking or climbing stairs? N  Dressing or bathing? N  Doing errands, shopping? N  Preparing Food and eating ? N  Using the Toilet? N  In the past six months, have you accidently leaked urine? N  Do you have problems with loss of bowel control? N  Managing your Medications? N  Managing your Finances? N  Housekeeping or managing your Housekeeping? N  Some recent data might be hidden    Patient Care Team: Tower, [name], MD as PCP - General Deboraha Sprang, MD as PCP - Electrophysiology (Cardiology) Jovita Kussmaul, MD as Consulting Physician (General Surgery) Truitt Merle, MD as  Consulting Physician (Hematology) Thea Silversmith, MD as Consulting Physician (Radiation Oncology) Rockwell Germany, RN as Registered Nurse Mauro Kaufmann, RN as Registered Nurse Deboraha Sprang, MD as Consulting Physician (Cardiology) Holley Bouche, NP (Inactive) as Nurse Practitioner (Nurse Practitioner) Sylvan Cheese, NP as Nurse Practitioner (Nurse Practitioner) Chesley Mires, MD as Consulting Physician (Pulmonary Disease) Jovita Kussmaul, MD as Consulting Physician (General Surgery) Bo Merino, MD as Consulting Physician (Rheumatology)    Assessment:   This is a routine wellness examination for Shavetta.  Exercise Activities and Dietary recommendations Current Exercise Habits: The patient does not participate in regular exercise at present, Exercise limited by: None identified  Goals    . Patient Stated     12/07/2019, I will start back going to the [facility] to work out.     . Weight < 176 lb (79.833 kg)     Starting 12/04/2018, I will attempt to reduce all added sugar in diet and increase exercise  for up to 90 min 3 days per week.        Fall Risk Fall Risk  12/07/2019 12/04/2018 11/27/2017 11/26/2016 11/22/2015  Falls in the past year? 0 0 No No Yes  Number falls in past yr: 0 - - - 1  Comment - - - - fell in yard; was running from a wasp; broke glasses; injury to head, shoulder, and hip  Injury with Fall? 0 - - - Yes  Risk for fall due to : Medication side effect - - - Impaired balance/gait;Impaired mobility  Risk for fall due to: Comment - - - - -  Follow up Falls evaluation completed;Falls prevention discussed - - - Falls evaluation completed;Education provided   Is the patient's home free of loose throw rugs in walkways, pet beds, electrical cords, etc?   yes      Grab bars in the bathroom? no      Handrails on the stairs?   yes      Adequate lighting?   yes  Timed Get Up and Go performed: N/A  Depression Screen PHQ 2/9 Scores 12/07/2019 10/01/2019  12/04/2018 12/17/2017  PHQ - 2 Score 0 2 2 3   PHQ- 9 Score 0 8 2 13      Cognitive Function MMSE - Mini Mental State Exam 12/07/2019 12/04/2018 11/27/2017 11/26/2016 11/22/2015  Not completed: - Unable to complete - - -  Orientation to time 5 - 5 5 5   Orientation to Place 5 - 5 5 5   Registration 3 - 3 3 3   Attention/ Calculation 5 - 0 0 0  Recall 3 - 3 3 3   Language- name 2 objects - - 0 0 0  Language- repeat 1 - 1 1 1   Language- follow 3 step command - - 3 3 3   Language- read & follow direction - - 0 0 0  Write a sentence - - 0 0 0  Copy design - - 0 0 0  Total score - - 20 20 20   Mini Cog  Mini-Cog screen was completed. Maximum score is 22. A value of 0 denotes this part of the MMSE was not completed or the patient failed this part of the Mini-Cog screening.       Immunization History  Administered Date(s) Administered  . Fluad Quad(high Dose 65+) 04/17/2019  . H1N1 07/24/2008  . Influenza Split 07/17/2011, 09/27/2011, 04/29/2012  . Influenza Whole 07/20/2004, 07/04/2007, 05/07/2008, 05/19/2009, 05/17/2010  . Influenza, High Dose Seasonal PF 05/30/2017, 05/21/2018  . Influenza,inj,Quad PF,6+ Mos 05/20/2013, 05/06/2014, 05/17/2015, 05/15/2016  . Influenza-Unspecified 05/07/2018  . PFIZER SARS-COV-2 Vaccination 09/25/2019, 10/16/2019  . Pneumococcal Conjugate-13 11/22/2015  . Pneumococcal Polysaccharide-23 05/20/2002, 05/07/2008, 06/25/2014  . Td 09/07/2009  . Zoster 09/21/2011    Qualifies for Shingles Vaccine: Yes  Screening Tests Health Maintenance  Topic Date Due  . TETANUS/TDAP  12/06/2020 (Originally 09/08/2019)  . MAMMOGRAM  01/21/2020  . INFLUENZA VACCINE  03/20/2020  . COLONOSCOPY  01/07/2023  . DEXA SCAN  Completed  . COVID-19 Vaccine  Completed  . Hepatitis C Screening  Completed  . PNA vac Low Risk Adult  Completed    Cancer Screenings: Lung: Low Dose CT Chest recommended if Age 21-80 years, 30 pack-year currently smoking OR have quit w/in 15 years. Patient  does not qualify. Breast:  Up to date on Mammogram: Yes, completed 01/21/2019   Bone Density/Dexa: completed 02/16/2012 Colorectal: completed 01/06/2018  Additional Screenings:  Hepatitis C Screening: 11/22/2015     Plan:  Patient will start back working out at [employer].  I have personally reviewed and noted the following in the patient's chart:   . Medical and social history . Use of alcohol, tobacco or illicit drugs  . Current medications and supplements . Functional ability and status . Nutritional status . Physical activity . Advanced directives . List of other physicians . Hospitalizations, surgeries, and ER visits in previous 12 months . Vitals . Screenings to include cognitive, depression, and falls . Referrals and appointments  In addition, I have reviewed and discussed with patient certain preventive protocols, quality metrics, and best practice recommendations. A written personalized care plan for preventive services as well as general preventive health recommendations were provided to patient.     Andrez Grime, LPN  [id number]

## 2019-12-07 NOTE — Patient Instructions (Signed)
[name] , Thank you for taking time to come for your Medicare Wellness Visit. I appreciate your ongoing commitment to your health goals. Please review the following plan we discussed and let me know if I can assist you in the future.   Screening recommendations/referrals: Colonoscopy: Up to date, completed 01/06/2018 Mammogram: Up to date, completed 01/21/2019 Bone Density: completed 02/16/2012 Recommended yearly ophthalmology/optometry visit for glaucoma screening and checkup Recommended yearly dental visit for hygiene and checkup  Vaccinations: Influenza vaccine: Up to date, completed 04/17/2019 Pneumococcal vaccine: Completed series Tdap vaccine: decline Shingles vaccine: discussed    Advanced directives: Please bring a copy of your POA (Power of Attorney) and/or Living Will to your next appointment.   Conditions/risks identified: hypertension, hyperlipidemia   Next appointment: 12/10/2019 @ 12:15 pm    Preventive Care 65 Years and Older, Female Preventive care refers to lifestyle choices and visits with your health care provider that can promote health and wellness. What does preventive care include?  A yearly physical exam. This is also called an annual well check.  Dental exams once or twice a year.  Routine eye exams. Ask your health care provider how often you should have your eyes checked.  Personal lifestyle choices, including:  Daily care of your teeth and gums.  Regular physical activity.  Eating a healthy diet.  Avoiding tobacco and drug use.  Limiting alcohol use.  Practicing safe sex.  Taking low-dose aspirin every day.  Taking vitamin and mineral supplements as recommended by your health care provider. What happens during an annual well check? The services and screenings done by your health care provider during your annual well check will depend on your age, overall health, lifestyle risk factors, and family history of disease. Counseling  Your health  care provider may ask you questions about your:  Alcohol use.  Tobacco use.  Drug use.  Emotional well-being.  Home and relationship well-being.  Sexual activity.  Eating habits.  History of falls.  Memory and ability to understand (cognition).  Work and work Statistician.  Reproductive health. Screening  You may have the following tests or measurements:  Height, weight, and BMI.  Blood pressure.  Lipid and cholesterol levels. These may be checked every 5 years, or more frequently if you are over 20 years old.  Skin check.  Lung cancer screening. You may have this screening every year starting at age 4 if you have a 30-pack-year history of smoking and currently smoke or have quit within the past 15 years.  Fecal occult blood test (FOBT) of the stool. You may have this test every year starting at age 61.  Flexible sigmoidoscopy or colonoscopy. You may have a sigmoidoscopy every 5 years or a colonoscopy every 10 years starting at age 61.  Hepatitis C blood test.  Hepatitis B blood test.  Sexually transmitted disease (STD) testing.  Diabetes screening. This is done by checking your blood sugar (glucose) after you have not eaten for a while (fasting). You may have this done every 1-3 years.  Bone density scan. This is done to screen for osteoporosis. You may have this done starting at age 74.  Mammogram. This may be done every 1-2 years. Talk to your health care provider about how often you should have regular mammograms. Talk with your health care provider about your test results, treatment options, and if necessary, the need for more tests. Vaccines  Your health care provider may recommend certain vaccines, such as:  Influenza vaccine. This is recommended  every year.  Tetanus, diphtheria, and acellular pertussis (Tdap, Td) vaccine. You may need a Td booster every 10 years.  Zoster vaccine. You may need this after age 39.  Pneumococcal 13-valent conjugate  (PCV13) vaccine. One dose is recommended after age 16.  Pneumococcal polysaccharide (PPSV23) vaccine. One dose is recommended after age 70. Talk to your health care provider about which screenings and vaccines you need and how often you need them. This information is not intended to replace advice given to you by your health care provider. Make sure you discuss any questions you have with your health care provider. Document Released: 09/02/2015 Document Revised: 04/25/2016 Document Reviewed: 06/07/2015 Elsevier Interactive Patient Education  2017 [place] Prevention in the Home Falls can cause injuries. They can happen to people of all ages. There are many things you can do to make your home safe and to help prevent falls. What can I do on the outside of my home?  Regularly fix the edges of walkways and driveways and fix any cracks.  Remove anything that might make you trip as you walk through a door, such as a raised step or threshold.  Trim any bushes or trees on the path to your home.  Use bright outdoor lighting.  Clear any walking paths of anything that might make someone trip, such as rocks or tools.  Regularly check to see if handrails are loose or broken. Make sure that both sides of any steps have handrails.  Any raised decks and porches should have guardrails on the edges.  Have any leaves, snow, or ice cleared regularly.  Use sand or salt on walking paths during winter.  Clean up any spills in your garage right away. This includes oil or grease spills. What can I do in the bathroom?  Use night lights.  Install grab bars by the toilet and in the tub and shower. Do not use towel bars as grab bars.  Use non-skid mats or decals in the tub or shower.  If you need to sit down in the shower, use a plastic, non-slip stool.  Keep the floor dry. Clean up any water that spills on the floor as soon as it happens.  Remove soap buildup in the tub or shower  regularly.  Attach bath mats securely with double-sided non-slip rug tape.  Do not have throw rugs and other things on the floor that can make you trip. What can I do in the bedroom?  Use night lights.  Make sure that you have a light by your bed that is easy to reach.  Do not use any sheets or blankets that are too big for your bed. They should not hang down onto the floor.  Have a firm chair that has side arms. You can use this for support while you get dressed.  Do not have throw rugs and other things on the floor that can make you trip. What can I do in the kitchen?  Clean up any spills right away.  Avoid walking on wet floors.  Keep items that you use a lot in easy-to-reach places.  If you need to reach something above you, use a strong step stool that has a grab bar.  Keep electrical cords out of the way.  Do not use floor polish or wax that makes floors slippery. If you must use wax, use non-skid floor wax.  Do not have throw rugs and other things on the floor that can make you trip. What  can I do with my stairs?  Do not leave any items on the stairs.  Make sure that there are handrails on both sides of the stairs and use them. Fix handrails that are broken or loose. Make sure that handrails are as long as the stairways.  Check any carpeting to make sure that it is firmly attached to the stairs. Fix any carpet that is loose or worn.  Avoid having throw rugs at the top or bottom of the stairs. If you do have throw rugs, attach them to the floor with carpet tape.  Make sure that you have a light switch at the top of the stairs and the bottom of the stairs. If you do not have them, ask someone to add them for you. What else can I do to help prevent falls?  Wear shoes that:  Do not have high heels.  Have rubber bottoms.  Are comfortable and fit you well.  Are closed at the toe. Do not wear sandals.  If you use a stepladder:  Make sure that it is fully  opened. Do not climb a closed stepladder.  Make sure that both sides of the stepladder are locked into place.  Ask someone to hold it for you, if possible.  Clearly mark and make sure that you can see:  Any grab bars or handrails.  First and last steps.  Where the edge of each step is.  Use tools that help you move around (mobility aids) if they are needed. These include:  Canes.  Walkers.  Scooters.  Crutches.  Turn on the lights when you go into a dark area. Replace any light bulbs as soon as they burn out.  Set up your furniture so you have a clear path. Avoid moving your furniture around.  If any of your floors are uneven, fix them.  If there are any pets around you, be aware of where they are.  Review your medicines with your doctor. Some medicines can make you feel dizzy. This can increase your chance of falling. Ask your doctor what other things that you can do to help prevent falls. This information is not intended to replace advice given to you by your health care provider. Make sure you discuss any questions you have with your health care provider. Document Released: 06/02/2009 Document Revised: 01/12/2016 Document Reviewed: 09/10/2014 Elsevier Interactive Patient Education  2017 [employer].

## 2019-12-08 ENCOUNTER — Ambulatory Visit (INDEPENDENT_AMBULATORY_CARE_PROVIDER_SITE_OTHER): Payer: Medicare Other | Admitting: Physician Assistant

## 2019-12-08 ENCOUNTER — Ambulatory Visit: Payer: Self-pay

## 2019-12-08 ENCOUNTER — Encounter: Payer: Self-pay | Admitting: Physician Assistant

## 2019-12-08 VITALS — BP 116/67 | HR 63 | Resp 15 | Ht 66.5 in | Wt 224.6 lb

## 2019-12-08 DIAGNOSIS — M1711 Unilateral primary osteoarthritis, right knee: Secondary | ICD-10-CM | POA: Diagnosis not present

## 2019-12-08 DIAGNOSIS — M25561 Pain in right knee: Secondary | ICD-10-CM

## 2019-12-08 DIAGNOSIS — M797 Fibromyalgia: Secondary | ICD-10-CM | POA: Diagnosis not present

## 2019-12-08 DIAGNOSIS — Z8619 Personal history of other infectious and parasitic diseases: Secondary | ICD-10-CM

## 2019-12-08 DIAGNOSIS — M62838 Other muscle spasm: Secondary | ICD-10-CM

## 2019-12-08 DIAGNOSIS — Z5181 Encounter for therapeutic drug level monitoring: Secondary | ICD-10-CM

## 2019-12-08 DIAGNOSIS — M19042 Primary osteoarthritis, left hand: Secondary | ICD-10-CM

## 2019-12-08 DIAGNOSIS — Z8659 Personal history of other mental and behavioral disorders: Secondary | ICD-10-CM

## 2019-12-08 DIAGNOSIS — Z8673 Personal history of transient ischemic attack (TIA), and cerebral infarction without residual deficits: Secondary | ICD-10-CM

## 2019-12-08 DIAGNOSIS — Z96652 Presence of left artificial knee joint: Secondary | ICD-10-CM

## 2019-12-08 DIAGNOSIS — M722 Plantar fascial fibromatosis: Secondary | ICD-10-CM

## 2019-12-08 DIAGNOSIS — M7061 Trochanteric bursitis, right hip: Secondary | ICD-10-CM

## 2019-12-08 DIAGNOSIS — G8929 Other chronic pain: Secondary | ICD-10-CM | POA: Diagnosis not present

## 2019-12-08 DIAGNOSIS — Z8669 Personal history of other diseases of the nervous system and sense organs: Secondary | ICD-10-CM

## 2019-12-08 DIAGNOSIS — Z8679 Personal history of other diseases of the circulatory system: Secondary | ICD-10-CM

## 2019-12-08 DIAGNOSIS — M5030 Other cervical disc degeneration, unspecified cervical region: Secondary | ICD-10-CM

## 2019-12-08 DIAGNOSIS — Z8719 Personal history of other diseases of the digestive system: Secondary | ICD-10-CM

## 2019-12-08 DIAGNOSIS — Z8639 Personal history of other endocrine, nutritional and metabolic disease: Secondary | ICD-10-CM

## 2019-12-08 DIAGNOSIS — Z853 Personal history of malignant neoplasm of breast: Secondary | ICD-10-CM

## 2019-12-08 DIAGNOSIS — I671 Cerebral aneurysm, nonruptured: Secondary | ICD-10-CM

## 2019-12-08 DIAGNOSIS — M19041 Primary osteoarthritis, right hand: Secondary | ICD-10-CM

## 2019-12-08 DIAGNOSIS — G2581 Restless legs syndrome: Secondary | ICD-10-CM

## 2019-12-08 MED ORDER — TRIAMCINOLONE ACETONIDE 40 MG/ML IJ SUSP
40.0000 mg | INTRAMUSCULAR | Status: AC | PRN
  Administered 2019-12-08: 40 mg via INTRA_ARTICULAR

## 2019-12-08 MED ORDER — LIDOCAINE HCL 1 % IJ SOLN
1.5000 mL | INTRAMUSCULAR | Status: AC | PRN
  Administered 2019-12-08: 1.5 mL

## 2019-12-08 NOTE — Patient Instructions (Signed)
Journal for Nurse Practitioners, 15(4), 263-267. Retrieved May 26, 2018 from http://clinicalkey.com/nursing">  Knee Exercises Ask your health care provider which exercises are safe for you. Do exercises exactly as told by your health care provider and adjust them as directed. It is normal to feel mild stretching, pulling, tightness, or discomfort as you do these exercises. Stop right away if you feel sudden pain or your pain gets worse. Do not begin these exercises until told by your health care provider. Stretching and range-of-motion exercises These exercises warm up your muscles and joints and improve the movement and flexibility of your knee. These exercises also help to relieve pain and swelling. Knee extension, prone 1. Lie on your abdomen (prone position) on a bed. 2. Place your left / right knee just beyond the edge of the surface so your knee is not on the bed. You can put a towel under your left / right thigh just above your kneecap for comfort. 3. Relax your leg muscles and allow gravity to straighten your knee (extension). You should feel a stretch behind your left / right knee. 4. Hold this position for __________ seconds. 5. Scoot up so your knee is supported between repetitions. Repeat __________ times. Complete this exercise __________ times a day. Knee flexion, active  1. Lie on your back with both legs straight. If this causes back discomfort, bend your left / right knee so your foot is flat on the floor. 2. Slowly slide your left / right heel back toward your buttocks. Stop when you feel a gentle stretch in the front of your knee or thigh (flexion). 3. Hold this position for __________ seconds. 4. Slowly slide your left / right heel back to the starting position. Repeat __________ times. Complete this exercise __________ times a day. Quadriceps stretch, prone  1. Lie on your abdomen on a firm surface, such as a bed or padded floor. 2. Bend your left / right knee and hold  your ankle. If you cannot reach your ankle or pant leg, loop a belt around your foot and grab the belt instead. 3. Gently pull your heel toward your buttocks. Your knee should not slide out to the side. You should feel a stretch in the front of your thigh and knee (quadriceps). 4. Hold this position for __________ seconds. Repeat __________ times. Complete this exercise __________ times a day. Hamstring, supine 1. Lie on your back (supine position). 2. Loop a belt or towel over the ball of your left / right foot. The ball of your foot is on the walking surface, right under your toes. 3. Straighten your left / right knee and slowly pull on the belt to raise your leg until you feel a gentle stretch behind your knee (hamstring). ? Do not let your knee bend while you do this. ? Keep your other leg flat on the floor. 4. Hold this position for __________ seconds. Repeat __________ times. Complete this exercise __________ times a day. Strengthening exercises These exercises build strength and endurance in your knee. Endurance is the ability to use your muscles for a long time, even after they get tired. Quadriceps, isometric This exercise stretches the muscles in front of your thigh (quadriceps) without moving your knee joint (isometric). 1. Lie on your back with your left / right leg extended and your other knee bent. Put a rolled towel or small pillow under your knee if told by your health care provider. 2. Slowly tense the muscles in the front of your left /   right thigh. You should see your kneecap slide up toward your hip or see increased dimpling just above the knee. This motion will push the back of the knee toward the floor. 3. For __________ seconds, hold the muscle as tight as you can without increasing your pain. 4. Relax the muscles slowly and completely. Repeat __________ times. Complete this exercise __________ times a day. Straight leg raises This exercise stretches the muscles in front  of your thigh (quadriceps) and the muscles that move your hips (hip flexors). 1. Lie on your back with your left / right leg extended and your other knee bent. 2. Tense the muscles in the front of your left / right thigh. You should see your kneecap slide up or see increased dimpling just above the knee. Your thigh may even shake a bit. 3. Keep these muscles tight as you raise your leg 4-6 inches (10-15 cm) off the floor. Do not let your knee bend. 4. Hold this position for __________ seconds. 5. Keep these muscles tense as you lower your leg. 6. Relax your muscles slowly and completely after each repetition. Repeat __________ times. Complete this exercise __________ times a day. Hamstring, isometric 1. Lie on your back on a firm surface. 2. Bend your left / right knee about __________ degrees. 3. Dig your left / right heel into the surface as if you are trying to pull it toward your buttocks. Tighten the muscles in the back of your thighs (hamstring) to "dig" as hard as you can without increasing any pain. 4. Hold this position for __________ seconds. 5. Release the tension gradually and allow your muscles to relax completely for __________ seconds after each repetition. Repeat __________ times. Complete this exercise __________ times a day. Hamstring curls If told by your health care provider, do this exercise while wearing ankle weights. Begin with __________ lb weights. Then increase the weight by 1 lb (0.5 kg) increments. Do not wear ankle weights that are more than __________ lb. 1. Lie on your abdomen with your legs straight. 2. Bend your left / right knee as far as you can without feeling pain. Keep your hips flat against the floor. 3. Hold this position for __________ seconds. 4. Slowly lower your leg to the starting position. Repeat __________ times. Complete this exercise __________ times a day. Squats This exercise strengthens the muscles in front of your thigh and knee  (quadriceps). 1. Stand in front of a table, with your feet and knees pointing straight ahead. You may rest your hands on the table for balance but not for support. 2. Slowly bend your knees and lower your hips like you are going to sit in a chair. ? Keep your weight over your heels, not over your toes. ? Keep your lower legs upright so they are parallel with the table legs. ? Do not let your hips go lower than your knees. ? Do not bend lower than told by your health care provider. ? If your knee pain increases, do not bend as low. 3. Hold the squat position for __________ seconds. 4. Slowly push with your legs to return to standing. Do not use your hands to pull yourself to standing. Repeat __________ times. Complete this exercise __________ times a day. Wall slides This exercise strengthens the muscles in front of your thigh and knee (quadriceps). 1. Lean your back against a smooth wall or door, and walk your feet out 18-24 inches (46-61 cm) from it. 2. Place your feet hip-width apart. 3.   Slowly slide down the wall or door until your knees bend __________ degrees. Keep your knees over your heels, not over your toes. Keep your knees in line with your hips. 4. Hold this position for __________ seconds. Repeat __________ times. Complete this exercise __________ times a day. Straight leg raises This exercise strengthens the muscles that rotate the leg at the hip and move it away from your body (hip abductors). 1. Lie on your side with your left / right leg in the top position. Lie so your head, shoulder, knee, and hip line up. You may bend your bottom knee to help you keep your balance. 2. Roll your hips slightly forward so your hips are stacked directly over each other and your left / right knee is facing forward. 3. Leading with your heel, lift your top leg 4-6 inches (10-15 cm). You should feel the muscles in your outer hip lifting. ? Do not let your foot drift forward. ? Do not let your knee  roll toward the ceiling. 4. Hold this position for __________ seconds. 5. Slowly return your leg to the starting position. 6. Let your muscles relax completely after each repetition. Repeat __________ times. Complete this exercise __________ times a day. Straight leg raises This exercise stretches the muscles that move your hips away from the front of the pelvis (hip extensors). 1. Lie on your abdomen on a firm surface. You can put a pillow under your hips if that is more comfortable. 2. Tense the muscles in your buttocks and lift your left / right leg about 4-6 inches (10-15 cm). Keep your knee straight as you lift your leg. 3. Hold this position for __________ seconds. 4. Slowly lower your leg to the starting position. 5. Let your leg relax completely after each repetition. Repeat __________ times. Complete this exercise __________ times a day. This information is not intended to replace advice given to you by your health care provider. Make sure you discuss any questions you have with your health care provider. Document Revised: 05/27/2018 Document Reviewed: 05/27/2018 Elsevier Patient Education  2020 [employer]  

## 2019-12-10 ENCOUNTER — Ambulatory Visit (INDEPENDENT_AMBULATORY_CARE_PROVIDER_SITE_OTHER): Payer: Medicare Other | Admitting: Family Medicine

## 2019-12-10 ENCOUNTER — Ambulatory Visit (INDEPENDENT_AMBULATORY_CARE_PROVIDER_SITE_OTHER): Payer: Medicare Other | Admitting: Podiatry

## 2019-12-10 ENCOUNTER — Encounter: Payer: Self-pay | Admitting: Family Medicine

## 2019-12-10 ENCOUNTER — Encounter: Payer: Self-pay | Admitting: Podiatry

## 2019-12-10 ENCOUNTER — Other Ambulatory Visit: Payer: Self-pay

## 2019-12-10 VITALS — Temp 98.3°F

## 2019-12-10 VITALS — BP 118/66 | HR 63 | Temp 96.9°F | Ht 66.0 in | Wt 222.2 lb

## 2019-12-10 DIAGNOSIS — R1030 Lower abdominal pain, unspecified: Secondary | ICD-10-CM

## 2019-12-10 DIAGNOSIS — E559 Vitamin D deficiency, unspecified: Secondary | ICD-10-CM

## 2019-12-10 DIAGNOSIS — J471 Bronchiectasis with (acute) exacerbation: Secondary | ICD-10-CM

## 2019-12-10 DIAGNOSIS — E669 Obesity, unspecified: Secondary | ICD-10-CM

## 2019-12-10 DIAGNOSIS — K760 Fatty (change of) liver, not elsewhere classified: Secondary | ICD-10-CM | POA: Diagnosis not present

## 2019-12-10 DIAGNOSIS — I10 Essential (primary) hypertension: Secondary | ICD-10-CM

## 2019-12-10 DIAGNOSIS — L600 Ingrowing nail: Secondary | ICD-10-CM

## 2019-12-10 DIAGNOSIS — Z853 Personal history of malignant neoplasm of breast: Secondary | ICD-10-CM

## 2019-12-10 DIAGNOSIS — E039 Hypothyroidism, unspecified: Secondary | ICD-10-CM | POA: Diagnosis not present

## 2019-12-10 DIAGNOSIS — Q828 Other specified congenital malformations of skin: Secondary | ICD-10-CM | POA: Diagnosis not present

## 2019-12-10 DIAGNOSIS — E781 Pure hyperglyceridemia: Secondary | ICD-10-CM

## 2019-12-10 DIAGNOSIS — E2839 Other primary ovarian failure: Secondary | ICD-10-CM

## 2019-12-10 DIAGNOSIS — R7303 Prediabetes: Secondary | ICD-10-CM

## 2019-12-10 DIAGNOSIS — I428 Other cardiomyopathies: Secondary | ICD-10-CM

## 2019-12-10 LAB — PAIN MGMT, PROFILE 5 W/CONF, U
Amphetamines: NEGATIVE ng/mL
Barbiturates: NEGATIVE ng/mL
Benzodiazepines: NEGATIVE ng/mL
Cocaine Metabolite: NEGATIVE ng/mL
Creatinine: 203.7 mg/dL
Marijuana Metabolite: NEGATIVE ng/mL
Methadone Metabolite: NEGATIVE ng/mL
Opiates: NEGATIVE ng/mL
Oxidant: NEGATIVE ug/mL
Oxycodone: NEGATIVE ng/mL
pH: 5.9 (ref 4.5–9.0)

## 2019-12-10 LAB — PAIN MGMT, TRAMADOL W/MEDMATCH, U
Desmethyltramadol: 4748 ng/mL
Tramadol: 4251 ng/mL

## 2019-12-10 MED ORDER — LEVOTHYROXINE SODIUM 50 MCG PO TABS: 50.0000 ug | ORAL_TABLET | Freq: Every day | ORAL | 3 refills | Status: DC

## 2019-12-10 MED ORDER — FLUTICASONE PROPIONATE 50 MCG/ACT NA SUSP: 2.0000 | Freq: Every day | NASAL | 3 refills | Status: DC | PRN

## 2019-12-10 NOTE — Assessment & Plan Note (Signed)
Disc goals for lipids and reasons to control them Rev last labs with pt Rev low sat fat diet in detail Very well controlled with simvastatin and diet  LDL is down with better diet-commended

## 2019-12-10 NOTE — Assessment & Plan Note (Signed)
Doing well without clinical changes

## 2019-12-10 NOTE — Progress Notes (Signed)
Subjective:    Patient ID: [name], female    DOB: [birth year], 69 y.o.   MRN: [id number]  This visit occurred during the SARS-CoV-2 public health emergency.  Safety protocols were in place, including screening questions prior to the visit, additional usage of staff PPE, and extensive cleaning of exam room while observing appropriate contact time as indicated for disinfecting solutions.    HPI Pt presents for annual f/u of chronic medical problems  Wt Readings from Last 3 Encounters:  12/10/19 222 lb 4 oz (100.8 kg)  12/08/19 224 lb 9.6 oz (101.9 kg)  12/07/19 226 lb (102.5 kg)  is going to the healthy lifestyle clinic for wt loss - taking metformin (some GI side effects)  35.87 kg/m  Less sugar  More fruits/veg  More water  This is difficult for her     Exercise -working in the yard again  Had injection in her knee so she can do more   Had a flare of her bronchiectasis in feb  Pulse ox 93% today  Not sob    Had amw on 4/19  ? About dexa   Mammogram 6/20  Self breast exam  Past h/o breast cancer   Colonoscopy 5/19   dexa 6/13  D level is 61 No falls  No fractures  Takes vit D daily  Interested in dexa   Had her covid vaccines  zostavax 2/13   (shingrix is not covered)   Hypertension  bp is stable today  No cp or palpitations or headaches or edema  No side effects to medicines  BP Readings from Last 3 Encounters:  12/10/19 118/66  12/08/19 116/67  11/23/19 108/68     Pulse Readings from Last 3 Encounters:  12/10/19 63  12/08/19 63  11/23/19 67   H/o cardiac issues incl nicm , pfo,   Lab Results  Component Value Date   CREATININE 0.77 12/07/2019   BUN 10 12/07/2019   NA 138 12/07/2019   K 4.2 12/07/2019   CL 105 12/07/2019   CO2 29 12/07/2019   Lab Results  Component Value Date   ALT 41 (H) 12/07/2019   AST 37 12/07/2019   ALKPHOS 102 12/07/2019   BILITOT 0.5 12/07/2019   Has h/o fatty liver   Hypothyroidism  Pt has no  clinical changes No change in energy level/ hair or skin/ edema and no tremor Lab Results  Component Value Date   TSH 2.56 12/07/2019     Hyperlipidemia  Lab Results  Component Value Date   CHOL 147 12/07/2019   CHOL 171 10/01/2019   CHOL 170 12/04/2018   Lab Results  Component Value Date   HDL 46.60 12/07/2019   HDL 47 10/01/2019   HDL 44.00 12/04/2018   Lab Results  Component Value Date   LDLCALC 77 12/07/2019   LDLCALC 97 10/01/2019   LDLCALC 91 12/04/2018   Lab Results  Component Value Date   TRIG 117.0 12/07/2019   TRIG 156 (H) 10/01/2019   TRIG 175.0 (H) 12/04/2018   Lab Results  Component Value Date   CHOLHDL 3 12/07/2019   CHOLHDL 3.6 10/01/2019   CHOLHDL 4 12/04/2018   Lab Results  Component Value Date   LDLDIRECT 141.8 07/04/2007   Simvastatin and diet  Is eating healthier - LDL is down to 77  Prediabetes Lab Results  Component Value Date   HGBA1C 5.3 12/07/2019   Stable  Takes metformin - from wt loss clinic   Lab Results  Component Value Date   WBC 3.5 (L) 12/07/2019   HGB 12.8 12/07/2019   HCT 39.0 12/07/2019   MCV 91.4 12/07/2019   PLT 178.0 12/07/2019    This SmartLink has not been configured with any valid records.    Patient Active Problem List   Diagnosis Date Noted  . Estrogen deficiency 12/10/2019  . Bronchiectasis with (acute) exacerbation ([place]) 10/23/2019  . Elevated LFTs 10/15/2019  . Lower abdominal pain 09/01/2019  . Neck pain 10/25/2017  . Paresthesias in left hand 10/25/2017  . Paresthesia of foot, bilateral 04/12/2017  . Primary osteoarthritis of both hands 09/27/2016  . Primary osteoarthritis of both knees 09/27/2016  . 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  . Nasopharyngitis, 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/2016  . S/P total knee replacement 02/13/2016  . Chronic pain of both knees 12/06/2015  . Globus pharyngeus 05/17/2015  . Genetic testing 12/23/2014  . Family history of breast cancer   . Family history of colon cancer    . Family history of pancreatic cancer   . History of breast cancer 11/23/2014  . Allergy to ACE inhibitors 09/27/2014  . Prediabetes 06/23/2013  . RLS (restless legs syndrome) 01/15/2013  . Hx of Clostridium difficile infection 10/10/2012  . Dyspnea on exertion 04/02/2012  . NICM (nonischemic cardiomyopathy) ([place]) 03/07/2012  . Post-menopausal 01/11/2012  . Obstructive sleep apnea 04/04/2010  . V-tach ([place]) 01/03/2009  . ICD  [employer]  Single chamber 01/03/2009  . PFO (patent foramen ovale) 09/29/2008  . Vitamin D deficiency 08/17/2008  . Hypothyroidism 11/18/2006  . Hyperlipidemia 11/18/2006  . Depression with anxiety 11/18/2006  . Essential hypertension 11/18/2006  . Mitral valve prolapse 11/18/2006  . Cerebral aneurysm 11/18/2006  . Allergic rhinitis 11/18/2006  . GERD 11/18/2006  . Diverticulosis of colon 11/18/2006  . Fatty liver 11/18/2006  . Fibromyalgia 11/18/2006   Past Medical History:  Diagnosis Date  . AICD (automatic cardioverter/defibrillator) present   . Anxiety   . Arthritis   . Back pain   . 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 Bronchiectasis ([place])   . Cerebral aneurysm, nonruptured    had a clip put in  . CHF (congestive heart failure) ([place])   . Clostridium difficile infection   . Depressive disorder, not elsewhere classified   . Diverticulosis of colon (without mention of hemorrhage)   . Esophageal reflux   . Fatty liver   . Fibromyalgia   . Gastritis   . GERD (gastroesophageal reflux disease)   . GI bleed 2004  . Glaucoma   . Hiatal hernia   . Hyperlipidemia   . Hypertension   . Hypothyroidism   . Internal hemorrhoids   . Joint pain   . Obstructive sleep apnea (adult) (pediatric)   . Osteoarthritis   . Ostium secundum type atrial septal defect   . Other chronic nonalcoholic liver disease   . Other pulmonary embolism and infarction   . Palpitations   . Paroxysmal ventricular tachycardia ([place])     . Personal history of radiation therapy   . Pneumonia    history  . Presence of permanent cardiac pacemaker   . PUD (peptic ulcer disease)   . Radiation 02/03/15-03/10/15   Right Breast  . Sarcoid    per pt , not sure  . Schatzki's ring   . Shortness of breath   . Sleep apnea   . Stroke [facility]) 2013   tia/ pt feels it was around 2008 0r 2009  . Takotsubo  syndrome   . Tubular adenoma of colon   . Unspecified transient cerebral ischemia   . Unspecified vitamin D deficiency    Past Surgical History:  Procedure Laterality Date  . ABI  2006   normal  . BREAST EXCISIONAL BIOPSY    . BREAST LUMPECTOMY WITH RADIOACTIVE SEED LOCALIZATION Right 01/03/2015   Procedure: BREAST LUMPECTOMY WITH RADIOACTIVE SEED LOCALIZATION;  Surgeon: Autumn Messing III, MD;  Location: [place];  Service: General;  Laterality: Right;  . CARDIAC DEFIBRILLATOR PLACEMENT  2006; 2012   BSX single chamber ICD  . carotid dopplers  2006   neg  . CEREBRAL ANEURYSM REPAIR  02/1999  . COLONOSCOPY    . hospitalization  2004   GI bleed, PUD, diverticulosis (EGD,colonscopy)  . hospitalization     PE, NSVT, s/p defib  . KNEE ARTHROSCOPY     bilateral  . LEFT HEART CATHETERIZATION WITH CORONARY ANGIOGRAM N/A 02/22/2012   Procedure: LEFT HEART CATHETERIZATION WITH CORONARY ANGIOGRAM;  Surgeon: Burnell Blanks, MD;  Location: [facility] CATH LAB;  Service: Cardiovascular;  Laterality: N/A;  . PARTIAL HYSTERECTOMY     Fibroids  . TONGUE BIOPSY  12/12/2017   due to sore tongue and white patches/abnormal cells  . TOTAL KNEE ARTHROPLASTY Left 02/13/2016   Procedure: TOTAL KNEE ARTHROPLASTY;  Surgeon: Vickey Huger, MD;  Location: [place];  Service: Orthopedics;  Laterality: Left;  . UPPER GASTROINTESTINAL ENDOSCOPY     Social History   Tobacco Use  . Smoking status: Former Smoker    Packs/day: 1.00    Years: 15.00    Pack years: 15.00    Types: Cigarettes    Quit date: 08/20/1989    Years since quitting: 30.3  . Smokeless tobacco:  Never Used  . Tobacco comment: Started at 38; less than 1 PPD  Substance Use Topics  . Alcohol use: Yes    Alcohol/week: 0.0 standard drinks    Comment: rare  . Drug use: No   Family History  Problem Relation Age of Onset  . Diabetes Mother   . Alzheimer's disease Mother   . Hypertension Mother   . Obesity Mother   . Other Brother        Thyroid problem 10/2016  . Allergies Sister   . Heart disease Sister   . Heart disease Brother   . Prostate cancer Brother 61       same brother as throat cancer  . Throat cancer Brother        dx in his 69s; also a smoker  . Heart disease Father   . Cancer Maternal Grandmother 70       colon cancer or abdominal cancer  . Lung cancer Maternal Grandfather 71  . Breast cancer Maternal Aunt 53  . Colon cancer Maternal Aunt 56       same sister as breast at 53  . Lung cancer Maternal Uncle   . Breast cancer Maternal Aunt        dx in her 36s  . Pancreatic cancer Cousin 77       maternal first cousin  . Breast cancer Cousin        paternal first cousin twice removed died in her 79s  . Heart disease Son        Cardiac Arrest 07/2016  . Stroke Neg Hx    Allergies  Allergen Reactions  . Ace Inhibitors Swelling    Angioedema; makes tongue "break out"   . Amitriptyline Hcl Other (See Comments)  makes her too sleepy!  . Atorvastatin Other (See Comments)    SEVERE MYALGIA  . Cymbalta [Duloxetine Hcl] Nausea Only and Other (See Comments)    Sleepiness/ sick  . Dilantin [Phenytoin Sodium Extended] Rash    Severe rash  . Paroxetine Nausea Only    Rapid heartbeat  . Ramipril Other (See Comments)    TONGUE ULCERS   . Rosuvastatin Other (See Comments)    SEVERE MYALGIA  . Carbamazepine Rash  . Codeine Itching  . Iodine-131 Other (See Comments)    Swelling at IV site only, no swelling or SOB around mouth.!  . Phenytoin Sodium Extended Rash  . Wellbutrin [Bupropion] Palpitations   Current Outpatient Medications on File Prior to Visit   Medication Sig Dispense Refill  . albuterol (PROVENTIL) (2.5 MG/3ML) 0.083% nebulizer solution USE 1 VIAL IN NEBULIZER EVERY 6 HOURS - and as needed 75 mL 11  . albuterol (VENTOLIN HFA) 108 (90 Base) MCG/ACT inhaler Inhale 2 puffs into the lungs every 6 (six) hours as needed for wheezing or shortness of breath. 18 g 3  . Ascorbic Acid (VITAMIN C) 1000 MG tablet Take 1,000 mg by mouth daily.    [name] aspirin 81 MG tablet Take 81 mg by mouth daily.    . baclofen (LIORESAL) 10 MG tablet Take 10 mg by mouth at bedtime.    . calcium carbonate (OS-CAL - DOSED IN MG OF ELEMENTAL CALCIUM) 1250 (500 Ca) MG tablet Take 1 tablet by mouth.    . carvedilol (COREG) 12.5 MG tablet TAKE 1 TABLET TWICE DAILY 180 tablet 1  . cholecalciferol (VITAMIN D) 1000 UNITS tablet Take 5,000 Units by mouth daily.     . fexofenadine (ALLEGRA) 180 MG tablet Take 180 mg by mouth daily as needed for allergies or rhinitis.    . furosemide (LASIX) 40 MG tablet Take 20 mg by mouth daily. 20-40mg     . hydroxypropyl methylcellulose / hypromellose (ISOPTO TEARS / GONIOVISC) 2.5 % ophthalmic solution Place 1 drop into both eyes daily.    [name] latanoprost (XALATAN) 0.005 % ophthalmic solution Place 1 drop into both eyes at bedtime.    . Magnesium Oxide 400 (240 Mg) MG TABS Take 1 tablet (400 mg total) by mouth daily. 90 tablet 3  . metFORMIN (GLUCOPHAGE) 500 MG tablet Take 1 tablet (500 mg total) by mouth 2 (two) times daily. 60 tablet 0  . mometasone (ELOCON) 0.1 % lotion Apply 1 application topically daily as needed (rash).     . montelukast (SINGULAIR) 10 MG tablet TAKE 1 TABLET AT BEDTIME 90 tablet 3  . Multiple Vitamins-Minerals (MULTIVITAMIN PO) Take 1 tablet by mouth daily.    . pantoprazole (PROTONIX) 40 MG tablet Take 1 tablet (40 mg total) by mouth daily. 90 tablet 1  . polyethylene glycol powder (GLYCOLAX/MIRALAX) powder Take 17 grams by mouth daily 1080 g 0  . potassium chloride (K-DUR) 10 MEQ tablet TAKE 1/2 TABLET (5MEQ TOTAL)  EVERY DAY 45 tablet 3  . Respiratory Therapy Supplies (FLUTTER) DEVI 1 Device by Does not apply route as needed. 1 each 0  . simvastatin (ZOCOR) 40 MG tablet TAKE 1/2 TABLET (20MG ) EVERY DAY 45 tablet 2  . sodium chloride HYPERTONIC 3 % nebulizer solution Take by nebulization 2 (two) times daily as needed for cough. 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 750 mL 12  . traMADol (ULTRAM) 50 MG tablet TAKE 1 TABLET BY MOUTH THREE TIMES DAILY AS NEEDED FOR NECK PAIN. 21 tablet 0  . zinc gluconate 50  MG tablet Take 50 mg by mouth daily.    [name] zolpidem (AMBIEN) 10 MG tablet TAKE 1 TABLET BY MOUTH AT BEDTIME AS NEEDED FOR SLEEP 90 tablet 1   No current facility-administered medications on file prior to visit.     Review of Systems  Constitutional: Negative for activity change, appetite change, fatigue, fever and unexpected weight change.  HENT: Negative for congestion, ear pain, rhinorrhea, sinus pressure and sore throat.   Eyes: Negative for pain, redness and visual disturbance.  Respiratory: Positive for shortness of breath. Negative for cough and wheezing.        Baseline sob  Cardiovascular: Negative for chest pain and palpitations.  Gastrointestinal: Positive for abdominal pain. Negative for blood in stool, constipation and diarrhea.       Low abd pain on and off  Endocrine: Negative for polydipsia and polyuria.  Genitourinary: Negative for dysuria, frequency and urgency.  Musculoskeletal: Positive for arthralgias and back pain. Negative for myalgias.       Pt has seen Dr Garen Grams and is now being ref to a chronic pain clinic  Skin: Negative for pallor and rash.       Sees podiatry for foot care/calluses   Allergic/Immunologic: Negative for environmental allergies.  Neurological: Negative for dizziness, syncope and headaches.  Hematological: Negative for adenopathy. Does not bruise/bleed easily.  Psychiatric/Behavioral: Negative for decreased concentration and dysphoric mood. The patient is nervous/anxious.         Objective:   Physical Exam Constitutional:      General: She is not in acute distress.    Appearance: Normal appearance. She is well-developed. She is obese. She is not ill-appearing or diaphoretic.  HENT:     Head: Normocephalic and atraumatic.     Right Ear: Tympanic membrane, ear canal and external ear normal.     Left Ear: Tympanic membrane, ear canal and external ear normal.     Nose: Nose normal. No congestion.     Mouth/Throat:     Mouth: Mucous membranes are moist.     Pharynx: Oropharynx is clear. No posterior oropharyngeal erythema.  Eyes:     General: No scleral icterus.    Extraocular Movements: Extraocular movements intact.     Conjunctiva/sclera: Conjunctivae normal.     Pupils: Pupils are equal, round, and reactive to light.  Neck:     Thyroid: No thyromegaly.     Vascular: No carotid bruit or JVD.  Cardiovascular:     Rate and Rhythm: Normal rate and regular rhythm.     Pulses: Normal pulses.     Heart sounds: Normal heart sounds. No gallop.   Pulmonary:     Effort: Pulmonary effort is normal. No respiratory distress.     Breath sounds: Normal breath sounds. No wheezing.     Comments: Good air exch Chest:     Chest wall: No tenderness.  Abdominal:     General: Bowel sounds are normal. There is no distension or abdominal bruit.     Palpations: Abdomen is soft. There is no mass.     Tenderness: There is abdominal tenderness. There is no guarding or rebound.     Hernia: No hernia is present.     Comments: Mild R lower abdomen tenderness  Genitourinary:    Comments: Breast exam: No mass, nodules, thickening, tenderness, bulging, retraction, inflamation, nipple discharge or skin changes noted.  No axillary or clavicular LA.     Musculoskeletal:        General: No tenderness. Normal  range of motion.     Cervical back: Normal range of motion and neck supple. No rigidity. No muscular tenderness.     Right lower leg: No edema.     Left lower leg: No edema.      Comments: Limited rom of some joints and spine  Pain with gait and getting on /off table  Lymphadenopathy:     Cervical: No cervical adenopathy.  Skin:    General: Skin is warm and dry.     Coloration: Skin is not pale.     Findings: No erythema or rash.     Comments: Stable lesion consistent with fibroadenoma on front of R lower leg   Neurological:     Mental Status: She is alert. Mental status is at baseline.     Cranial Nerves: No cranial nerve deficit.     Motor: No abnormal muscle tone.     Coordination: Coordination normal.     Gait: Gait normal.     Deep Tendon Reflexes: Reflexes are normal and symmetric. Reflexes normal.  Psychiatric:        Mood and Affect: Mood normal.        Cognition and Memory: Cognition and memory normal.     Comments: Pleasant and talkative  Admits to worry about health issues            Assessment & Plan:   Problem List Items Addressed This Visit      Cardiovascular and Mediastinum   Essential hypertension - Primary    bp in fair control at this time  BP Readings from Last 1 Encounters:  12/10/19 118/66   No changes needed Most recent labs reviewed  Disc lifstyle change with low sodium diet and exercise        NICM (nonischemic cardiomyopathy) ([place])    Pt currently sees Dr Caryl Comes for ICD/rhythm issue and would like to be assigned a regular cardiologist to the cardiomyopathy  Takes lasix Is baseline sob from bronchiectasis and wonders if some of it is from her heart  Referral done      Relevant Orders   Ambulatory referral to Cardiology     Respiratory   Bronchiectasis with (acute) exacerbation ([place])    Per pt - last flare has calmed down No current abx        Digestive   Fatty liver    Pt is working on wt loss through a program and doing well  Currently ALT id 41 and AST 37 Enc her to continue wt loss         Endocrine   Hypothyroidism    Hypothyroidism  Pt has no clinical changes No change in energy level/  hair or skin/ edema and no tremor Lab Results  Component Value Date   TSH 2.56 12/07/2019          Relevant Medications   levothyroxine (SYNTHROID) 50 MCG tablet     Other   Vitamin D deficiency    Vitamin D level is therapeutic with current supplementation Disc importance of this to bone and overall health  Level of 61       Hyperlipidemia    Disc goals for lipids and reasons to control them Rev last labs with pt Rev low sat fat diet in detail Very well controlled with simvastatin and diet  LDL is down with better diet-commended       Prediabetes    Lab Results  Component Value Date   HGBA1C 5.3 12/07/2019   Currently  taking metformin -some GI issues and unsure if she will continue it      History of breast cancer    Doing well without clinical changes      Lower abdominal pain    Ongoing/ off and on  Rev last CT scan  R hydrosalpinx - she disc with gyn      Estrogen deficiency    Ref for dexa-pt plans to have when she gets her mammogram this summer  No falls or fx Vit D level is therapeutic       Relevant Orders   DG Bone Density   Obesity (BMI 30-39.9)    Going to the healthy weight and wellness clinic Is currently loosing weight with diet/exercise  Was also started on metformin  Commended

## 2019-12-10 NOTE — Assessment & Plan Note (Signed)
bp in fair control at this time  BP Readings from Last 1 Encounters:  12/10/19 118/66   No changes needed Most recent labs reviewed  Disc lifstyle change with low sodium diet and exercise

## 2019-12-10 NOTE — Assessment & Plan Note (Signed)
Ongoing/ off and on  Rev last CT scan  R hydrosalpinx - she disc with gyn

## 2019-12-10 NOTE — Patient Instructions (Addendum)
You can call the breast center to ask for a bone density test the day you get your mammogram   Stay as active as you can be  Chair yoga is fantastic  Be outdoors when you can   Keep working on healthy diet for weight loss   Our office will call you about a cardiology referral    Take care of yourself

## 2019-12-10 NOTE — Assessment & Plan Note (Signed)
Pt is working on wt loss through a program and doing well  Currently ALT id 41 and AST 37 Enc her to continue wt loss

## 2019-12-10 NOTE — Assessment & Plan Note (Signed)
Going to the healthy weight and wellness clinic Is currently loosing weight with diet/exercise  Was also started on metformin  Commended

## 2019-12-10 NOTE — Assessment & Plan Note (Signed)
Hypothyroidism  Pt has no clinical changes No change in energy level/ hair or skin/ edema and no tremor Lab Results  Component Value Date   TSH 2.56 12/07/2019

## 2019-12-10 NOTE — Assessment & Plan Note (Signed)
Lab Results  Component Value Date   HGBA1C 5.3 12/07/2019   Currently taking metformin -some GI issues and unsure if she will continue it

## 2019-12-10 NOTE — Assessment & Plan Note (Signed)
Pt currently sees Dr Caryl Comes for ICD/rhythm issue and would like to be assigned a regular cardiologist to the cardiomyopathy  Takes lasix Is baseline sob from bronchiectasis and wonders if some of it is from her heart  Referral done

## 2019-12-10 NOTE — Progress Notes (Signed)
UDS is consistent with treatment.

## 2019-12-10 NOTE — Assessment & Plan Note (Signed)
Ref for dexa-pt plans to have when she gets her mammogram this summer  No falls or fx Vit D level is therapeutic

## 2019-12-10 NOTE — Assessment & Plan Note (Signed)
Per pt - last flare has calmed down No current abx

## 2019-12-10 NOTE — Assessment & Plan Note (Signed)
Vitamin D level is therapeutic with current supplementation Disc importance of this to bone and overall health  Level of 61

## 2019-12-11 ENCOUNTER — Encounter: Payer: Self-pay | Admitting: Physical Medicine and Rehabilitation

## 2019-12-11 ENCOUNTER — Other Ambulatory Visit: Payer: Self-pay | Admitting: Rheumatology

## 2019-12-11 NOTE — Telephone Encounter (Signed)
Last Visit: 12/08/2019 Next Visit: 06/07/2020 UDS:12/08/2019 c/w Narc Agreement: 12/08/2019 (not scanned in yet)   Last fill: 11/26/2019 (21 tablets)  Referral placed to pain management on 12/08/2019  Okay to refill tramadol?

## 2019-12-11 NOTE — Progress Notes (Signed)
Subjective:   Patient ID: [name], female   DOB: 69 y.o.   MRN: [id number]   HPI Patient presents with chronic keratotic lesions of mostly left over right foot with both feet being sore and ingrown toenail deformity left hallux medial border that she was concerned about   ROS      Objective:  Physical Exam  Neurovascular status intact with patient found to have keratotic lesions subfirst 3rd 5th metatarsal bilateral and 3rd digits chronic in intensity with incurvated medial border of the left hallux     Assessment:  Nail disease with ingrown toenail and keratotic lesion secondary to pressure     Plan:  H&P reviewed both conditions and debridement of nails accomplished today along with lesions with no iatrogenic bleeding and continue with routine care at this time.  Discussed possible other treatments if symptoms were to persist or get worse

## 2019-12-14 ENCOUNTER — Other Ambulatory Visit: Payer: Self-pay | Admitting: Cardiology

## 2019-12-16 ENCOUNTER — Encounter (INDEPENDENT_AMBULATORY_CARE_PROVIDER_SITE_OTHER): Payer: Self-pay | Admitting: Family Medicine

## 2019-12-16 ENCOUNTER — Other Ambulatory Visit: Payer: Self-pay

## 2019-12-16 ENCOUNTER — Ambulatory Visit (INDEPENDENT_AMBULATORY_CARE_PROVIDER_SITE_OTHER): Payer: Medicare Other | Admitting: Family Medicine

## 2019-12-16 VITALS — BP 119/62 | HR 75 | Temp 98.3°F | Ht 66.0 in | Wt 215.0 lb

## 2019-12-16 DIAGNOSIS — M1711 Unilateral primary osteoarthritis, right knee: Secondary | ICD-10-CM | POA: Diagnosis not present

## 2019-12-16 DIAGNOSIS — R7303 Prediabetes: Secondary | ICD-10-CM | POA: Diagnosis not present

## 2019-12-16 DIAGNOSIS — K760 Fatty (change of) liver, not elsewhere classified: Secondary | ICD-10-CM

## 2019-12-16 DIAGNOSIS — E669 Obesity, unspecified: Secondary | ICD-10-CM | POA: Diagnosis not present

## 2019-12-16 DIAGNOSIS — E781 Pure hyperglyceridemia: Secondary | ICD-10-CM

## 2019-12-16 DIAGNOSIS — Z6834 Body mass index (BMI) 34.0-34.9, adult: Secondary | ICD-10-CM | POA: Diagnosis not present

## 2019-12-16 DIAGNOSIS — Z9989 Dependence on other enabling machines and devices: Secondary | ICD-10-CM

## 2019-12-16 DIAGNOSIS — G4733 Obstructive sleep apnea (adult) (pediatric): Secondary | ICD-10-CM | POA: Diagnosis not present

## 2019-12-17 ENCOUNTER — Other Ambulatory Visit: Payer: Self-pay | Admitting: Family Medicine

## 2019-12-17 DIAGNOSIS — Z9889 Other specified postprocedural states: Secondary | ICD-10-CM

## 2019-12-17 NOTE — Progress Notes (Signed)
Chief Complaint:   OBESITY Michaella is here to discuss her progress with her obesity treatment plan along with follow-up of her obesity related diagnoses. Amarelis is on keeping a food journal and adhering to recommended goals of 1200-1300 calories and 85 grams of protein and states she is following her eating plan approximately 50% of the time. Arelli states she is going to the gym for 60 minutes 2-3 times per week.  Today's visit was #: 5 Starting weight: 223 lbs Starting date: 10/01/2019 Today's weight: 215 lbs Today's date: 12/16/2019 Total lbs lost to date: 8 lbs Total lbs lost since last in-office visit: 6 lbs  Interim History: Azaryah says she is getting more than 6000 steps per day.  Subjective:   1. NAFLD (nonalcoholic fatty liver disease) Tomeika has a dx of elevated ALT.  Her BMI is 34.8. She denies abdominal pain or jaundice and has never been told of any liver problems in the past. She denies excessive alcohol intake.  AST has improved from 44-37, and ALT has improved from 58-41.  Lab Results  Component Value Date   ALT 41 (H) 12/07/2019   AST 37 12/07/2019   ALKPHOS 102 12/07/2019   BILITOT 0.5 12/07/2019   Lab Results  Component Value Date   ALT 41 (H) 12/07/2019   AST 37 12/07/2019   ALKPHOS 102 12/07/2019   BILITOT 0.5 12/07/2019   2. Prediabetes Tayshia has a diagnosis of prediabetes based on her elevated HgA1c and was informed this puts her at greater risk of developing diabetes. She continues to work on diet and exercise to decrease her risk of diabetes. She denies nausea or hypoglycemia.  She is taking metformin 500 mg twice daily.  AIC has improved from 5.7 to 5.3.  Lab Results  Component Value Date   HGBA1C 5.3 12/07/2019   Lab Results  Component Value Date   INSULIN 15.6 10/01/2019   3. OSA on CPAP Lasundra has a diagnosis of sleep apnea. She reports that she is using a CPAP regularly.   4. Osteoarthritis of right knee, unspecified osteoarthritis  type Chenelle states the pain in her right knee inhibits some exercise.  5. Hypertriglyceridemia Triglycerides have improved from 156 to 117.  Lab Results  Component Value Date   ALT 41 (H) 12/07/2019   AST 37 12/07/2019   ALKPHOS 102 12/07/2019   BILITOT 0.5 12/07/2019   Lab Results  Component Value Date   CHOL 147 12/07/2019   HDL 46.60 12/07/2019   LDLCALC 77 12/07/2019   LDLDIRECT 141.8 07/04/2007   TRIG 117.0 12/07/2019   CHOLHDL 3 12/07/2019   Assessment/Plan:   1. NAFLD (nonalcoholic fatty liver disease) We discussed the likely diagnosis of non-alcoholic fatty liver disease today and how this condition is obesity related. Sra was educated the importance of weight loss. Genelda agreed to continue with her weight loss efforts with healthier diet and exercise as an essential part of her treatment plan.  2. Prediabetes Earsie will continue to work on weight loss, exercise, and decreasing simple carbohydrates to help decrease the risk of diabetes.   3. OSA on CPAP Intensive lifestyle modifications are the first line treatment for this issue. We discussed several lifestyle modifications today and she will continue to work on diet, exercise and weight loss efforts. We will continue to monitor. Orders and follow up as documented in patient record.   Counseling  Sleep apnea is a condition in which breathing pauses or becomes shallow during sleep. This happens  over and over during the night. This disrupts your sleep and keeps your body from getting the rest that it needs, which can cause tiredness and lack of energy (fatigue) during the day.  Sleep apnea treatment: If you were given a device to open your airway while you sleep, USE IT!  Sleep hygiene:   Limit or avoid alcohol, caffeinated beverages, and cigarettes, especially close to bedtime.   Do not eat a large meal or eat spicy foods right before bedtime. This can lead to digestive discomfort that can make it hard for you to  sleep.  Keep a sleep diary to help you and your health care provider figure out what could be causing your insomnia.  . Make your bedroom a dark, comfortable place where it is easy to fall asleep. ? Put up shades or blackout curtains to block light from outside. ? Use a white noise machine to block noise. ? Keep the temperature cool. . Limit screen use before bedtime. This includes: ? Watching TV. ? Using your smartphone, tablet, or computer. . Stick to a routine that includes going to bed and waking up at the same times every day and night. This can help you fall asleep faster. Consider making a quiet activity, such as reading, part of your nighttime routine. . Try to avoid taking naps during the day so that you sleep better at night. . Get out of bed if you are still awake after 15 minutes of trying to sleep. Keep the lights down, but try reading or doing a quiet activity. When you feel sleepy, go back to bed.  4. Osteoarthritis of right knee, unspecified osteoarthritis type Will follow because mobility and pain control are important for weight management.  5. Hypertriglyceridemia Will continue to monitor.  6. Class 1 obesity with serious comorbidity and body mass index (BMI) of 34.0 to 34.9 in adult, unspecified obesity type Ron is currently in the action stage of change. As such, her goal is to continue with weight loss efforts. She has agreed to keeping a food journal and adhering to recommended goals of 1200-1300 calories and 85 grams of protein.   Exercise goals: As is.  Behavioral modification strategies: increasing lean protein intake and increasing water intake.  Corina has agreed to follow-up with our clinic in 2-4 weeks. She was informed of the importance of frequent follow-up visits to maximize her success with intensive lifestyle modifications for her multiple health conditions.   Objective:   Blood pressure 119/62, pulse 75, temperature 98.3 F (36.8 C), temperature  source Oral, height 5\' 6"  (1.676 m), weight 215 lb (97.5 kg), SpO2 96 %. Body mass index is 34.7 kg/m.  General: Cooperative, alert, well developed, in no acute distress. HEENT: Conjunctivae and lids unremarkable. Cardiovascular: Regular rhythm.  Lungs: Normal work of breathing. Neurologic: No focal deficits.   Lab Results  Component Value Date   CREATININE 0.77 12/07/2019   BUN 10 12/07/2019   NA 138 12/07/2019   K 4.2 12/07/2019   CL 105 12/07/2019   CO2 29 12/07/2019   Lab Results  Component Value Date   ALT 41 (H) 12/07/2019   AST 37 12/07/2019   ALKPHOS 102 12/07/2019   BILITOT 0.5 12/07/2019   Lab Results  Component Value Date   HGBA1C 5.3 12/07/2019   HGBA1C 5.7 (H) 10/01/2019   HGBA1C 5.4 12/04/2018   HGBA1C 5.6 11/27/2017   HGBA1C 5.6 04/12/2017   Lab Results  Component Value Date   INSULIN 15.6 10/01/2019  Lab Results  Component Value Date   TSH 2.56 12/07/2019   Lab Results  Component Value Date   CHOL 147 12/07/2019   HDL 46.60 12/07/2019   LDLCALC 77 12/07/2019   LDLDIRECT 141.8 07/04/2007   TRIG 117.0 12/07/2019   CHOLHDL 3 12/07/2019   Lab Results  Component Value Date   WBC 3.5 (L) 12/07/2019   HGB 12.8 12/07/2019   HCT 39.0 12/07/2019   MCV 91.4 12/07/2019   PLT 178.0 12/07/2019   Lab Results  Component Value Date   IRON 89 10/01/2019   TIBC 335 10/01/2019   FERRITIN 178 (H) 10/01/2019   Obesity Behavioral Intervention:   Approximately 15 minutes were spent on the discussion below.  ASK: We discussed the diagnosis of obesity with [name] today and Guyla agreed to give [place] permission to discuss obesity behavioral modification therapy today.  ASSESS: Desarea has the diagnosis of obesity and her BMI today is 34.8. Loralei is in the action stage of change.   ADVISE: Olina was educated on the multiple health risks of obesity as well as the benefit of weight loss to improve her health. She was advised of the need for long term treatment  and the importance of lifestyle modifications to improve her current health and to decrease her risk of future health problems.  AGREE: Multiple dietary modification options and treatment options were discussed and Shamarah agreed to follow the recommendations documented in the above note.  ARRANGE: Annajane was educated on the importance of frequent visits to treat obesity as outlined per CMS and USPSTF guidelines and agreed to schedule her next follow up appointment today.  Attestation Statements:   Reviewed by clinician on day of visit: allergies, medications, problem list, medical history, surgical history, family history, social history, and previous encounter notes.  I, Water quality scientist, CMA, am acting as Location manager for [employer], DO.  I have reviewed the above documentation for accuracy and completeness, and I agree with the above. Briscoe Deutscher, DO

## 2019-12-21 ENCOUNTER — Encounter: Payer: Self-pay | Admitting: Physical Medicine and Rehabilitation

## 2019-12-21 ENCOUNTER — Encounter
Payer: Medicare Other | Attending: Physical Medicine and Rehabilitation | Admitting: Physical Medicine and Rehabilitation

## 2019-12-21 ENCOUNTER — Other Ambulatory Visit: Payer: Self-pay

## 2019-12-21 VITALS — BP 125/78 | HR 63 | Temp 97.2°F | Ht 66.0 in | Wt 220.0 lb

## 2019-12-21 DIAGNOSIS — M25561 Pain in right knee: Secondary | ICD-10-CM | POA: Diagnosis present

## 2019-12-21 DIAGNOSIS — M7918 Myalgia, other site: Secondary | ICD-10-CM | POA: Diagnosis present

## 2019-12-21 DIAGNOSIS — Z79891 Long term (current) use of opiate analgesic: Secondary | ICD-10-CM | POA: Insufficient documentation

## 2019-12-21 DIAGNOSIS — G8929 Other chronic pain: Secondary | ICD-10-CM | POA: Diagnosis present

## 2019-12-21 DIAGNOSIS — M25562 Pain in left knee: Secondary | ICD-10-CM | POA: Diagnosis present

## 2019-12-21 DIAGNOSIS — M542 Cervicalgia: Secondary | ICD-10-CM | POA: Diagnosis present

## 2019-12-21 DIAGNOSIS — M170 Bilateral primary osteoarthritis of knee: Secondary | ICD-10-CM | POA: Diagnosis present

## 2019-12-21 DIAGNOSIS — M797 Fibromyalgia: Secondary | ICD-10-CM | POA: Diagnosis present

## 2019-12-21 DIAGNOSIS — Z5181 Encounter for therapeutic drug level monitoring: Secondary | ICD-10-CM | POA: Diagnosis present

## 2019-12-21 MED ORDER — MAGNESIUM OXIDE 400 MG PO TABS: 400.0000 mg | ORAL_TABLET | Freq: Two times a day (BID) | ORAL | 11 refills | Status: DC

## 2019-12-21 NOTE — Progress Notes (Signed)
Subjective:    Patient ID: [name], female    DOB: [birth year], 69 y.o.   MRN: [id number]  HPI  Patient is a 69 yr old female with hx of  Bronchiesctasis, CHF, prediabetes- with A1c of 5.3- - not on blood thinners, has a defibrillator, GERD, and fibromyalgia issues- dx'd 10 years ago. Here for evaluation.  Already has L TKR-  And candidate for R TKR, but concerned to do due to lung issues.   Last injection R knee was ~ 2 weeks ago.  Didn't last even 1 week.   Wondering about gel injection- to try and get some relief.   Having more pain in L neck and L shoulder-  Does heat and ice.  Vibrating massage for neck and shoulder Tries to do some HEP for it as well- 1x/day.   What is doing for neck- helped "somewhat".   Is not as bad as it was before had injection from IR.     Had IR do ESI injection in upper back - just above bra strap- had it done December 2020. Has lasted a long while-    Also having pain in  Mid and lower back- On R mid back and Sacral midline pain. Feels like squeezing pain, like cramping.  Had a few months.  Also had R lateral lower thoracic pain- tight, has associated muscle spasms/knot there as well.    Went to therapy for it- therapy helped- squats helps tailbone/lower back pain.  When bothering her, does the squats- does HEP-  Doesn't do every day, just when hurts.    Hurts worse in yard- digs holes every now and then, pulls weeds- tries to keep moving.   Hard to move with knees.   Of note, has fluid in fallopian tubes- on R side- also has RLQ pain- thinks it's related.  Gyn says it's likely been there awhile and to not do anything right now.     Pain Inventory Average Pain 5 Pain Right Now 3 My pain is dull, stabbing and aching  In the last 24 hours, has pain interfered with the following? General activity 5 Relation with others 4 Enjoyment of life 7 What TIME of day is your pain at its worst? evening, night  Sleep (in general)  Fair  Pain is worse with: walking, bending, standing and some activites Pain improves with: rest, heat/ice, medication and injections Relief from Meds: 5  Mobility walk with assistance use a cane use a walker how many minutes can you walk? 5 ability to climb steps?  no do you drive?  yes  Function not employed: date last employed . I need assistance with the following:  household duties  Neuro/Psych trouble walking  Prior Studies new  Physicians involved in your care new   Family History  Problem Relation Age of Onset  . Diabetes Mother   . Alzheimer's disease Mother   . Hypertension Mother   . Obesity Mother   . Other Brother        Thyroid problem 10/2016  . Allergies Sister   . Heart disease Sister   . Heart disease Brother   . Prostate cancer Brother 72       same brother as throat cancer  . Throat cancer Brother        dx in his 70s; also a smoker  . Heart disease Father   . Cancer Maternal Grandmother 41       colon cancer or abdominal cancer  .  Lung cancer Maternal Grandfather 49  . Breast cancer Maternal Aunt 38  . Colon cancer Maternal Aunt 59       same sister as breast at 39  . Lung cancer Maternal Uncle   . Breast cancer Maternal Aunt        dx in her 65s  . Pancreatic cancer Cousin 14       maternal first cousin  . Breast cancer Cousin        paternal first cousin twice removed died in her 87s  . Heart disease Son        Cardiac Arrest 07/2016  . Stroke Neg Hx    Social History   Socioeconomic History  . Marital status: Divorced    Spouse name: Not on file  . Number of children: 2  . Years of education: 74  . Highest education level: Not on file  Occupational History  . Occupation: DISABLED  Tobacco Use  . Smoking status: Former Smoker    Packs/day: 1.00    Years: 15.00    Pack years: 15.00    Types: Cigarettes    Quit date: 08/20/1989    Years since quitting: 30.3  . Smokeless tobacco: Never Used  . Tobacco comment: Started at  22; less than 1 PPD  Substance and Sexual Activity  . Alcohol use: Yes    Alcohol/week: 0.0 standard drinks    Comment: rare  . Drug use: No  . Sexual activity: Not Currently  Other Topics Concern  . Not on file  Social History Narrative   Lives alone   Caffeine [id number]   Retired from [employer]   2 children   Divorced   Right handed    Social Determinants of Health   Financial Resource Strain: Low Risk   . Difficulty of Paying Living Expenses: Not hard at all  Food Insecurity: No Food Insecurity  . Worried About Charity fundraiser in the Last Year: Never true  . Ran Out of Food in the Last Year: Never true  Transportation Needs: No Transportation Needs  . Lack of Transportation (Medical): No  . Lack of Transportation (Non-Medical): No  Physical Activity: Inactive  . Days of Exercise per Week: 0 days  . Minutes of Exercise per Session: 0 min  Stress: Stress Concern Present  . Feeling of Stress : To some extent  Social Connections:   . Frequency of Communication with Friends and Family:   . Frequency of Social Gatherings with Friends and Family:   . Attends Religious Services:   . Active Member of Clubs or Organizations:   . Attends Archivist Meetings:   [name] Marital Status:    Past Surgical History:  Procedure Laterality Date  . ABI  2006   normal  . BREAST EXCISIONAL BIOPSY    . BREAST LUMPECTOMY WITH RADIOACTIVE SEED LOCALIZATION Right 01/03/2015   Procedure: BREAST LUMPECTOMY WITH RADIOACTIVE SEED LOCALIZATION;  Surgeon: Autumn Messing III, MD;  Location: [place];  Service: General;  Laterality: Right;  . CARDIAC DEFIBRILLATOR PLACEMENT  2006; 2012   BSX single chamber ICD  . carotid dopplers  2006   neg  . CEREBRAL ANEURYSM REPAIR  02/1999  . COLONOSCOPY    . hospitalization  2004   GI bleed, PUD, diverticulosis (EGD,colonscopy)  . hospitalization     PE, NSVT, s/p defib  . KNEE ARTHROSCOPY     bilateral  . LEFT HEART CATHETERIZATION WITH CORONARY  ANGIOGRAM N/A 02/22/2012   Procedure: LEFT  HEART CATHETERIZATION WITH CORONARY ANGIOGRAM;  Surgeon: Burnell Blanks, MD;  Location[facility] C[facility] LAB;  Service: Cardiovascular;  Laterality: N/A;  . PARTIAL HYSTERECTOMY     Fibroids  . TONGUE BIOPSY  12/12/2017   due to sore tongue and white patches/abnormal cells  . TOTAL KNEE ARTHROPLASTY Left 02/13/2016   Procedure: TOTAL KNEE ARTHROPLASTY;  Surgeon: Vickey Huger, MD;  Location: [place];  Service: Orthopedics;  Laterality: Left;  . UPPER GASTROINTESTINAL ENDOSCOPY     Past Medical History:  Diagnosis Date  . AICD (automatic cardioverter/defibrillator) present   . Anxiety   . Arthritis   . Back pain   . 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 Bronchiectasis ([place])   . Cerebral aneurysm, nonruptured    had a clip put in  . CHF (congestive heart failure) ([place])   . Clostridium difficile infection   . Depressive disorder, not elsewhere classified   . Diverticulosis of colon (without mention of hemorrhage)   . Esophageal reflux   . Fatty liver   . Fibromyalgia   . Gastritis   . GERD (gastroesophageal reflux disease)   . GI bleed 2004  . Glaucoma   . Hiatal hernia   . Hyperlipidemia   . Hypertension   . Hypothyroidism   . Internal hemorrhoids   . Joint pain   . Obstructive sleep apnea (adult) (pediatric)   . Osteoarthritis   . Ostium secundum type atrial septal defect   . Other chronic nonalcoholic liver disease   . Other pulmonary embolism and infarction   . Palpitations   . Paroxysmal ventricular tachycardia ([place])   . Personal history of radiation therapy   . Pneumonia    history  . Presence of permanent cardiac pacemaker   . PUD (peptic ulcer disease)   . Radiation 02/03/15-03/10/15   Right Breast  . Sarcoid    per pt , not sure  . Schatzki's ring   . Shortness of breath   . Sleep apnea   . Stroke [facility]) 2013   tia/ pt feels it was around 2008 0r 2009  . Takotsubo syndrome   . Tubular  adenoma of colon   . Unspecified transient cerebral ischemia   . Unspecified vitamin D deficiency    Temp (!) 97.2 F (36.2 C)   Ht 5\' 6"  (1.676 m)   Wt 220 lb (99.8 kg)   LMP  (LMP Unknown)   BMI 35.51 kg/m   Opioid Risk Score:   Fall Risk Score:  `1  Depression screen PHQ 2/9  Depression screen [facility] 2/9 12/21/2019 12/07/2019 10/01/2019 12/04/2018 12/17/2017 11/27/2017 11/26/2016  Decreased Interest 1 0 1 2 2  0 0  Down, Depressed, Hopeless 0 0 1 0 1 0 0  PHQ - 2 Score 1 0 2 2 3  0 0  Altered sleeping 0 0 1 0 0 0 -  Tired, decreased energy 1 0 1 0 - 0 -  Change in appetite 0 0 1 0 3 0 -  Feeling bad or failure about yourself  0 0 1 0 2 0 -  Trouble concentrating 1 0 1 0 3 0 -  Moving slowly or fidgety/restless 0 0 1 0 2 0 -  Suicidal thoughts 0 0 0 0 0 0 -  PHQ-9 Score 3 0 8 2 13  0 -  Difficult doing work/chores Not difficult at all Not difficult at all Somewhat difficult Not difficult at all - Not difficult at all -  Some recent data might be hidden    Review of Systems  Constitutional: Negative.   HENT: Negative.   Eyes: Negative.   Respiratory: Positive for apnea, shortness of breath and wheezing.   Cardiovascular: Negative.   Gastrointestinal: Positive for abdominal pain and nausea.  Endocrine: Negative.   Genitourinary: Negative.   Musculoskeletal: Positive for arthralgias, back pain, gait problem and neck pain.  Skin: Negative.   Allergic/Immunologic: Negative.   Psychiatric/Behavioral: Negative.   All other systems reviewed and are negative.      Objective:   Physical Exam  Awake, alert, appropriate, sitting up on bed/table, NAD 2-3+ crepitus R knee Moderate effusion seen in front medially and laterally as well as a small Baker's cyst palpated.  Trigger points in- rhomboids, upper traps, levators, scalenes, splenius capitus, R pecs (pacemaker palpated on L) Also R lower thoracic paraspinals and midline TTP over sacrum Also has a Left lumbar paraspinal trigger  points as well   Pain with extension and flexion of lumbar spine in midline sacral area.  R trochanteric bursitis- very TTP        Assessment & Plan:   Patient is a 69 yr old female with hx of  Bronchiesctasis, CHF, prediabetes- with A1c of 5.3- - not on blood thinners, has a defibrillator, GERD, and fibromyalgia issues- dx'd 10 years ago. Here for evaluation   1. Already on magnesium 400 mg daily- takes miralax to have BMs-    2.  Have her come back  For trigger point injections.  2week  3. Can do celestone injection for R knee- if pt wants to- have to wait 6 weeks until we do it.  Will need insurance prior auth  4. Can do steroid injection of R trochanteric bursitis when comes for trigger point injections.   5. Theracane - can get online- 2-4 minutes firm pressure -not massage- can use youtube for videos to use it.   6. Tennis balls- 2-4 minutes of firm pressure- no massage- keep 1 in car, 1 in house.   7. Usually take tramadol 1x/day- usually earlier in day- Rx from PCP- Suggest 2x/day.- can cause constipation- I suggest tramadol 1 pil 2x/day- as needed- With your knee DJD being so bad, feel like she really needs something to take regularly. If PCP wants to continue the Rx, great- if not, let me know.   8. F/u in 2 weeks.   9. Can try CBD oil- <3% THC- is legal in [place].   I spent a total of 55 minutes on visit- as detailed above.

## 2019-12-21 NOTE — Addendum Note (Signed)
Addended by: Geryl Rankins D on: 12/21/2019 11:26 AM   Modules accepted: Orders

## 2019-12-21 NOTE — Patient Instructions (Addendum)
Patient is a 69 yr old female with hx of  Bronchiesctasis, CHF, prediabetes- with A1c of 5.3- - not on blood thinners, has a defibrillator, GERD, and fibromyalgia issues- dx'd 10 years ago. Here for evaluation   1. Already on magnesium 400 mg daily- takes miralax to have BMs-    2.  Have her come back  For trigger point injections.  2week  3. Can do celestone injection for R knee- if pt wants to- have to wait 6 weeks until we do it.  Will need insurance prior auth  4. Can do steroid injection of R trochanteric bursitis when comes for trigger point injections.   5. Theracane - can get online- 2-4 minutes firm pressure -not massage- can use youtube for videos to use it.   6. Tennis balls- 2-4 minutes of firm pressure- no massage- keep 1 in car, 1 in house.   7. Usually take tramadol 1x/day- usually earlier in day- Rx from PCP- Suggest 2x/day.- can cause constipation- I suggest tramadol 1 pil 2x/day- as needed- With your knee DJD being so bad, feel like she really needs something to take regularly. If PCP wants to continue the Rx, great- if not, let me know. Is currently having ot go to pharmacy 1x/week to pick up Rx- would like me to take over Tramadol. Will need opiate contract and urine drug screen today then.   8. Can try CBD oil- is legal in [place]. <3% THC   9. F/u in 2 weeks.

## 2019-12-23 ENCOUNTER — Other Ambulatory Visit (INDEPENDENT_AMBULATORY_CARE_PROVIDER_SITE_OTHER): Payer: Self-pay | Admitting: Family Medicine

## 2019-12-23 DIAGNOSIS — R7303 Prediabetes: Secondary | ICD-10-CM

## 2019-12-23 LAB — TOXASSURE SELECT,+ANTIDEPR,UR

## 2019-12-24 MED ORDER — METFORMIN HCL 500 MG PO TABS: 500.0000 mg | ORAL_TABLET | Freq: Two times a day (BID) | ORAL | 0 refills | Status: DC

## 2019-12-25 ENCOUNTER — Encounter: Payer: Medicare Other | Admitting: Physical Medicine and Rehabilitation

## 2020-01-01 ENCOUNTER — Telehealth: Payer: Self-pay | Admitting: *Deleted

## 2020-01-01 NOTE — Telephone Encounter (Signed)
Urine drug screen for this encounter is consistent for prescribed medication 

## 2020-01-04 ENCOUNTER — Other Ambulatory Visit: Payer: Self-pay

## 2020-01-04 ENCOUNTER — Encounter (HOSPITAL_BASED_OUTPATIENT_CLINIC_OR_DEPARTMENT_OTHER): Payer: Medicare Other | Admitting: Physical Medicine and Rehabilitation

## 2020-01-04 ENCOUNTER — Encounter: Payer: Self-pay | Admitting: Physical Medicine and Rehabilitation

## 2020-01-04 VITALS — BP 115/76 | HR 61 | Temp 97.2°F | Ht 66.0 in | Wt 219.4 lb

## 2020-01-04 DIAGNOSIS — M25562 Pain in left knee: Secondary | ICD-10-CM

## 2020-01-04 DIAGNOSIS — M797 Fibromyalgia: Secondary | ICD-10-CM | POA: Diagnosis not present

## 2020-01-04 DIAGNOSIS — M25561 Pain in right knee: Secondary | ICD-10-CM

## 2020-01-04 DIAGNOSIS — M7918 Myalgia, other site: Secondary | ICD-10-CM | POA: Diagnosis not present

## 2020-01-04 DIAGNOSIS — M542 Cervicalgia: Secondary | ICD-10-CM | POA: Diagnosis not present

## 2020-01-04 DIAGNOSIS — G8929 Other chronic pain: Secondary | ICD-10-CM

## 2020-01-04 MED ORDER — TRAMADOL HCL 50 MG PO TABS: 50.0000 mg | ORAL_TABLET | Freq: Two times a day (BID) | ORAL | 5 refills | Status: DC

## 2020-01-04 NOTE — Patient Instructions (Signed)
Patient here for trigger point injections for myofascial pain  Consent done and on chart.  Cleaned areas with alcohol and injected using a 27 gauge 1.5 inch needle  Injected 5cc Using 1% Lidocaine with no EPI  Upper traps B/L Levators B/L Posterior scalenes B/L Middle scalenes Splenius Capitus B/L Pectoralis Major B/L- avoided pacemaker on L Rhomboids B/L Infraspinatus Teres Major/minor Thoracic paraspinals on R only Lumbar paraspinals Other injections-    Patient's level of pain prior was 4-5/10 Current level of pain after injections is- 1/10   There was no bleeding or complications.  Patient was advised to drink a lot of water on day after injections to flush system Will have increased soreness for 12-48 hours after injections.  Can use Lidocaine patches the day AFTER injections Can use theracane on day of injections in places didn't inject Can use heating pad 4-6 hours AFTER injections   2. R trochanteric bursa steroid injection was performed at  using 1% plain Lidocaine and 40mg  /1cc of Kenalog. This was well tolerated.  Cleaned with betadine x3 and allowed to dry- then alcohol then injected using 27 gauge 1.5 inch needle- no bleeding or complications.    F/U in 3 months for steroid injections of R trochanteric bursa Lidocaine will kick in 15 minutes- and wear off tonight- the steroid will kick in tomorrow within 24 hours and take up to 72 hours to fully kick in.  3. Tramadol sent in 50 mg BID #60 5 RFs  4. F/U in 4 weeks for trigger point injections.

## 2020-01-04 NOTE — Progress Notes (Signed)
Patient is a 68 yr old female with hx of  Bronchiesctasis, CHF, prediabetes- with A1c of 5.3- - not on blood thinners, has a defibrillator, GERD, and fibromyalgia issues- dx'd 10 years ago. Here for trigger point injections.    Hurting bad since ran out of tramadol-last week.  Can't get in the bathtub Wants to soak and to get pain better controlled.    Patient here for trigger point injections for myofascial pain  Consent done and on chart.  Cleaned areas with alcohol and injected using a 27 gauge 1.5 inch needle  Injected 5cc Using 1% Lidocaine with no EPI  Upper traps B/L Levators B/L Posterior scalenes B/L Middle scalenes Splenius Capitus B/L Pectoralis Major B/L- avoided pacemaker on L Rhomboids B/L Infraspinatus Teres Major/minor Thoracic paraspinals on R only Lumbar paraspinals Other injections-    Patient's level of pain prior was 4-5/10 Current level of pain after injections is- 1/10   There was no bleeding or complications.  Patient was advised to drink a lot of water on day after injections to flush system Will have increased soreness for 12-48 hours after injections.  Can use Lidocaine patches the day AFTER injections Can use theracane on day of injections in places didn't inject Can use heating pad 4-6 hours AFTER injections   2. R trochanteric bursa steroid injection was performed at  using 1% plain Lidocaine and 40mg  /1cc of Kenalog. This was well tolerated.  Cleaned with betadine x3 and allowed to dry- then alcohol then injected using 27 gauge 1.5 inch needle- no bleeding or complications.    F/U in 3 months for steroid injections of R trochanteric bursa Lidocaine will kick in 15 minutes- and wear off tonight- the steroid will kick in tomorrow within 24 hours and take up to 72 hours to fully kick in.  3. Tramadol sent in 50 mg BID #60 5 RFs  4. F/U in 4 weeks for trigger point injections.

## 2020-01-06 ENCOUNTER — Encounter (INDEPENDENT_AMBULATORY_CARE_PROVIDER_SITE_OTHER): Payer: Self-pay | Admitting: Physician Assistant

## 2020-01-06 ENCOUNTER — Other Ambulatory Visit: Payer: Self-pay

## 2020-01-06 ENCOUNTER — Ambulatory Visit (INDEPENDENT_AMBULATORY_CARE_PROVIDER_SITE_OTHER): Payer: Medicare Other | Admitting: Physician Assistant

## 2020-01-06 VITALS — BP 114/75 | HR 62 | Temp 97.9°F | Ht 66.0 in | Wt 212.0 lb

## 2020-01-06 DIAGNOSIS — E781 Pure hyperglyceridemia: Secondary | ICD-10-CM

## 2020-01-06 DIAGNOSIS — Z6834 Body mass index (BMI) 34.0-34.9, adult: Secondary | ICD-10-CM

## 2020-01-06 DIAGNOSIS — E669 Obesity, unspecified: Secondary | ICD-10-CM | POA: Diagnosis not present

## 2020-01-06 DIAGNOSIS — R7303 Prediabetes: Secondary | ICD-10-CM

## 2020-01-07 ENCOUNTER — Ambulatory Visit (INDEPENDENT_AMBULATORY_CARE_PROVIDER_SITE_OTHER): Payer: Medicare Other | Admitting: Interventional Cardiology

## 2020-01-07 ENCOUNTER — Encounter: Payer: Medicare Other | Admitting: Student

## 2020-01-07 ENCOUNTER — Encounter: Payer: Self-pay | Admitting: Interventional Cardiology

## 2020-01-07 VITALS — BP 112/80 | HR 68 | Ht 66.0 in | Wt 219.0 lb

## 2020-01-07 DIAGNOSIS — I428 Other cardiomyopathies: Secondary | ICD-10-CM

## 2020-01-07 DIAGNOSIS — I10 Essential (primary) hypertension: Secondary | ICD-10-CM

## 2020-01-07 DIAGNOSIS — I517 Cardiomegaly: Secondary | ICD-10-CM

## 2020-01-07 DIAGNOSIS — Z95810 Presence of automatic (implantable) cardiac defibrillator: Secondary | ICD-10-CM

## 2020-01-07 NOTE — Patient Instructions (Signed)
Medication Instructions:  Your physician recommends that you continue on your current medications as directed. Please refer to the Current Medication list given to you today.  If you need a refill on your cardiac medications before your next appointment, please call your pharmacy.   Lab work: None Ordered  If you have labs (blood work) drawn today and your tests are completely normal, you will receive your results only by: [name] MyChart Message (if you have MyChart) OR . A paper copy in the mail If you have any lab test that is abnormal or we need to change your treatment, we will call you to review the results.  Testing/Procedures: None ordered  Follow-Up: . AS NEEDED with Dr. Irish Lack  Any Other Special Instructions Will Be Listed Below (If Applicable).

## 2020-01-07 NOTE — Progress Notes (Signed)
Cardiology Office Note   Date:  01/07/2020   ID:  LEHA HATZ, DOB June 03, 1951, MRN [id number]  PCP:  Abner Greenspan, MD    No chief complaint on file.  NICM  Wt Readings from Last 3 Encounters:  01/07/20 219 lb (99.3 kg)  01/06/20 212 lb (96.2 kg)  01/04/20 219 lb 6.4 oz (99.5 kg)       History of Present Illness: [name] is a 69 y.o. female who had a nonischemic cardiomyopathy.  In 2013, ejection fraction was listed at 35 to 40%.  Most recent echocardiogram in 2018 that showed that her LVEF has normalized.  She did have a defibrillator placed for ventricular tachycardia.  No CAD by cath in approximately 2006.  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  She has been feeling ok.  She had a CXR and there was a question of enlarged heart.    She was having some palpitations, and Zio patch was ok in 11/2019: "Symptoms of flutters assoc mostly with PVCs  Symptoms of flutters also assoc with sinus S VT Nonsustained 6 episodes; fastest 141 bpm for 6 beats; longest for 7 beats at 100 bpm "  Denies : Chest pain. Dizziness. Leg edema. Nitroglycerin use. Orthopnea. Palpitations. Paroxysmal nocturnal dyspnea. Syncope.   She has resumed exercise with a stationary bike. Feels well while exercising.  Difficulty walking up stairs due to knee.     Past Medical History:  Diagnosis Date  . AICD (automatic cardioverter/defibrillator) present   . Anxiety   . Arthritis   . Back pain   . Breast cancer ([place]) 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  . Bronchiectasis ([place])   . Cerebral aneurysm, nonruptured    had a clip put in  . CHF (congestive heart failure) ([place])   . Clostridium difficile infection   . Depressive disorder, not elsewhere classified   . Diverticulosis of colon (without mention of hemorrhage)   . Esophageal reflux   . Fatty liver   . Fibromyalgia   . Gastritis   . GERD (gastroesophageal reflux disease)   . GI bleed 2004  .  Glaucoma   . Hiatal hernia   . Hyperlipidemia   . Hypertension   . Hypothyroidism   . Internal hemorrhoids   . Joint pain   . Obstructive sleep apnea (adult) (pediatric)   . Osteoarthritis   . Ostium secundum type atrial septal defect   . Other chronic nonalcoholic liver disease   . Other pulmonary embolism and infarction   . Palpitations   . Paroxysmal ventricular tachycardia ([place])   . Personal history of radiation therapy   . Pneumonia    history  . Presence of permanent cardiac pacemaker   . PUD (peptic ulcer disease)   . Radiation 02/03/15-03/10/15   Right Breast  . Sarcoid    per pt , not sure  . Schatzki's ring   . Shortness of breath   . Sleep apnea   . Stroke [facility]) 2013   tia/ pt feels it was around 2008 0r 2009  . Takotsubo syndrome   . Tubular adenoma of colon   . Unspecified transient cerebral ischemia   . Unspecified vitamin D deficiency     Past Surgical History:  Procedure Laterality Date  . ABI  2006   normal  . BREAST EXCISIONAL BIOPSY    . BREAST LUMPECTOMY WITH RADIOACTIVE SEED LOCALIZATION Right 01/03/2015   Procedure: BREAST LUMPECTOMY WITH RADIOACTIVE  SEED LOCALIZATION;  Surgeon: Autumn Messing III, MD;  Location: [facility];  Service: General;  Laterality: Right;  . CARDIAC DEFIBRILLATOR PLACEMENT  2006; 2012   BSX single chamber ICD  . carotid dopplers  2006   neg  . CEREBRAL ANEURYSM REPAIR  02/1999  . COLONOSCOPY    . hospitalization  2004   GI bleed, PUD, diverticulosis (EGD,colonscopy)  . hospitalization     PE, NSVT, s/p defib  . KNEE ARTHROSCOPY     bilateral  . LEFT HEART CATHETERIZATION WITH CORONARY ANGIOGRAM N/A 02/22/2012   Procedure: LEFT HEART CATHETERIZATION WITH CORONARY ANGIOGRAM;  Surgeon: Burnell Blanks, MD;  Location: [facility] CATH LAB;  Service: Cardiovascular;  Laterality: N/A;  . PARTIAL HYSTERECTOMY     Fibroids  . TONGUE BIOPSY  12/12/2017   due to sore tongue and white patches/abnormal cells  . TOTAL KNEE ARTHROPLASTY Left  02/13/2016   Procedure: TOTAL KNEE ARTHROPLASTY;  Surgeon: Vickey Huger, MD;  Location: [facility];  Service: Orthopedics;  Laterality: Left;  . UPPER GASTROINTESTINAL ENDOSCOPY       Current Outpatient Medications  Medication Sig Dispense Refill  . albuterol (PROVENTIL) (2.5 MG/3ML) 0.083% nebulizer solution USE 1 VIAL IN NEBULIZER EVERY 6 HOURS - and as needed 75 mL 11  . albuterol (VENTOLIN HFA) 108 (90 Base) MCG/ACT inhaler Inhale 2 puffs into the lungs every 6 (six) hours as needed for wheezing or shortness of breath. 18 g 3  . Ascorbic Acid (VITAMIN C) 1000 MG tablet Take 1,000 mg by mouth daily.    [name] aspirin 81 MG tablet Take 81 mg by mouth daily.    . baclofen (LIORESAL) 10 MG tablet Take 10 mg by mouth at bedtime.    . calcium carbonate (OS-CAL - DOSED IN MG OF ELEMENTAL CALCIUM) 1250 (500 Ca) MG tablet Take 1 tablet by mouth.    . carvedilol (COREG) 12.5 MG tablet TAKE 1 TABLET TWICE DAILY 180 tablet 1  . cholecalciferol (VITAMIN D) 1000 UNITS tablet Take 5,000 Units by mouth daily.     . fexofenadine (ALLEGRA) 180 MG tablet Take 180 mg by mouth daily as needed for allergies or rhinitis.    . fluticasone (FLONASE) 50 MCG/ACT nasal spray Place 2 sprays into both nostrils daily as needed for allergies or rhinitis. 48 g 3  . furosemide (LASIX) 40 MG tablet Take 20 mg by mouth daily. 20-40mg     . hydroxypropyl methylcellulose / hypromellose (ISOPTO TEARS / GONIOVISC) 2.5 % ophthalmic solution Place 1 drop into both eyes daily.    [name] latanoprost (XALATAN) 0.005 % ophthalmic solution Place 1 drop into both eyes at bedtime.    [name] levothyroxine (SYNTHROID) 50 MCG tablet Take 1 tablet (50 mcg total) by mouth daily. 90 tablet 3  . magnesium oxide (MAG-OX) 400 MG tablet Take 1 tablet (400 mg total) by mouth 2 (two) times daily. 60 tablet 11  . Magnesium Oxide 400 (240 Mg) MG TABS Take 1 tablet (400 mg total) by mouth daily. 90 tablet 3  . metFORMIN (GLUCOPHAGE) 500 MG tablet Take 1 tablet (500 mg  total) by mouth 2 (two) times daily. 60 tablet 0  . mometasone (ELOCON) 0.1 % lotion Apply 1 application topically daily as needed (rash).     . montelukast (SINGULAIR) 10 MG tablet TAKE 1 TABLET AT BEDTIME 90 tablet 3  . Multiple Vitamins-Minerals (MULTIVITAMIN PO) Take 1 tablet by mouth daily.    . pantoprazole (PROTONIX) 40 MG tablet Take 1 tablet (40 mg total)  by mouth daily. 90 tablet 1  . polyethylene glycol powder (GLYCOLAX/MIRALAX) powder Take 17 grams by mouth daily 1080 g 0  . potassium chloride (K-DUR) 10 MEQ tablet TAKE 1/2 TABLET (5MEQ TOTAL) EVERY DAY 45 tablet 3  . Respiratory Therapy Supplies (FLUTTER) DEVI 1 Device by Does not apply route as needed. 1 each 0  . simvastatin (ZOCOR) 40 MG tablet TAKE 1/2 TABLET EVERY DAY 45 tablet 3  . sodium chloride HYPERTONIC 3 % nebulizer solution Take by nebulization 2 (two) times daily as needed for cough. 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 750 mL 12  . traMADol (ULTRAM) 50 MG tablet Take 1 tablet (50 mg total) by mouth 2 (two) times daily. 60 tablet 5  . zinc gluconate 50 MG tablet Take 50 mg by mouth daily.    [name] zolpidem (AMBIEN) 10 MG tablet TAKE 1 TABLET BY MOUTH AT BEDTIME AS NEEDED FOR SLEEP 90 tablet 1   No current facility-administered medications for this visit.    Allergies:   Ace inhibitors, Amitriptyline hcl, Atorvastatin, Cymbalta [duloxetine hcl], Dilantin [phenytoin sodium extended], Paroxetine, Ramipril, Rosuvastatin, Carbamazepine, Codeine, Iodine-131, Phenytoin sodium extended, and Wellbutrin [bupropion]    Social History:  The patient  reports that she quit smoking about 30 years ago. Her smoking use included cigarettes. She has a 15.00 pack-year smoking history. She has never used smokeless tobacco. She reports current alcohol use. She reports that she does not use drugs.   Family History:  The patient's family history includes Allergies in her sister; Alzheimer's disease in her mother; Breast cancer in her cousin and maternal aunt;  Breast cancer (age of onset: 65) in her maternal aunt; Cancer (age of onset: 41) in her maternal grandmother; Colon cancer (age of onset: 34) in her maternal aunt; Diabetes in her mother; Heart disease in her brother, father, sister, and son; Hypertension in her mother; Lung cancer in her maternal uncle; Lung cancer (age of onset: 62) in her maternal grandfather; Obesity in her mother; Other in her brother; Pancreatic cancer (age of onset: 74) in her cousin; Prostate cancer (age of onset: 36) in her brother; Throat cancer in her brother.    ROS:  Please see the history of present illness.   Otherwise, review of systems are positive for intentional weight loss.   All other systems are reviewed and negative.    PHYSICAL EXAM: VS:  BP 112/80   Pulse 68   Ht 5\' 6"  (1.676 m)   Wt 219 lb (99.3 kg)   LMP  (LMP Unknown)   SpO2 94%   BMI 35.35 kg/m  , BMI Body mass index is 35.35 kg/m. GEN: Well nourished, well developed, in no acute distress  HEENT: normal  Neck: no JVD, carotid bruits, or masses Cardiac: RRR; no murmurs, rubs, or gallops,no edema  Respiratory:  clear to auscultation bilaterally, normal work of breathing GI: soft, nontender, nondistended, + BS MS: no deformity or atrophy  Skin: warm and dry, no rash Neuro:  Strength and sensation are intact Psych: euthymic mood, full affect   EKG:   The ekg ordered 2/21 demonstrates NSR no ST changes   Recent Labs: 03/17/2019: BNP 92.4 12/07/2019: ALT 41; BUN 10; Creatinine, Ser 0.77; Hemoglobin 12.8; Platelets 178.0; Potassium 4.2; Sodium 138; TSH 2.56   Lipid Panel    Component Value Date/Time   CHOL 147 12/07/2019 1053   CHOL 171 10/01/2019 1408   TRIG 117.0 12/07/2019 1053   HDL 46.60 12/07/2019 1053   HDL 47 10/01/2019 1408  CHOLHDL 3 12/07/2019 1053   VLDL 23.4 12/07/2019 1053   LDLCALC 77 12/07/2019 1053   LDLCALC 97 10/01/2019 1408   LDLDIRECT 141.8 07/04/2007 0936     Other studies Reviewed: Additional studies/  records that were reviewed today with results demonstrating: PMD labs reviewed.   ASSESSMENT AND PLAN:  1. Prior NICM: Resolved. Appears euvolemic.   2. HTN: The current medical regimen is effective;  continue present plan and medications.  LVH likely caused "cardioegaly" on xray. 3. Morbid obesity: She has lost 11 lbs at weight loss center.  4. Knee pain.  May need to see ortho. 5. She received her COVID vaccines.  6. ICD per EP   Current medicines are reviewed at length with the patient today.  The patient concerns regarding her medicines were addressed.  The following changes have been made:  No change  Labs/ tests ordered today include:  No orders of the defined types were placed in this encounter.   Recommend 150 minutes/week of aerobic exercise Low fat, low carb, high fiber diet recommended  Disposition:   FU as needed   Signed, Larae Grooms, MD  01/07/2020 8:52 AM    [facility] [place], [place], [place]  [address] Phone: [phone]; Fax: [phone]

## 2020-01-07 NOTE — Progress Notes (Signed)
Chief Complaint:   OBESITY [name] is here to discuss her progress with her obesity treatment plan along with follow-up of her obesity related diagnoses. Ansley is on keeping a food journal and adhering to recommended goals of 1200-1300 calories and 85 grams of protein daily and states she is following her eating plan approximately 30% of the time. Autum states she is riding the stationary bike for 60 minutes 4 times per week.  Today's visit was #: 6 Starting weight: 223 lbs Starting date: 10/01/2019 Today's weight: 212 lbs Today's date: 01/06/2020 Total lbs lost to date: 11 Total lbs lost since last in-office visit: 3  Interim History: Yarielys states that she doesn't stick with the plan most days, and she is not journaling. She is feeling very tired today but she believes she is not getting enough protein.  Subjective:   1. Pre-diabetes Tylena's A1c improved from 5.7 to 5.3. She is not taking metformin currently due to side effects.  2. Hypertriglyceridemia Magaline's triglycerides are now at 117, and is in normal range.  Assessment/Plan:   1. Pre-diabetes Altie will continue to work on weight loss, exercise, and decreasing simple carbohydrates to help decrease the risk of diabetes. Nyliyah may continue metformin when she is following the plan more more closely.  2. Hypertriglyceridemia Cardiovascular risk and specific lipid/LDL goals reviewed. We discussed several lifestyle modifications today and Tanyanika will continue to work on diet, exercise and weight loss efforts. Orders and follow up as documented in patient record.   Counseling Intensive lifestyle modifications are the first line treatment for this issue. . Dietary changes: Increase soluble fiber. Decrease simple carbohydrates. . Exercise changes: Moderate to vigorous-intensity aerobic activity 150 minutes per week if tolerated. . Lipid-lowering medications: see documented in medical record.  3. Class 1 obesity with serious  comorbidity and body mass index (BMI) of 34.0 to 34.9 in adult, unspecified obesity type Marcie is currently in the action stage of change. As such, her goal is to continue with weight loss efforts. She has agreed to keeping a food journal and adhering to recommended goals of 1200-1300 calories and 85 grams of protein daily.   Exercise goals: As is, add 2 days of resistance training.  Behavioral modification strategies: meal planning and cooking strategies and keeping healthy foods in the home.  Sheryn has agreed to follow-up with our clinic in 3 weeks. She was informed of the importance of frequent follow-up visits to maximize her success with intensive lifestyle modifications for her multiple health conditions.   Objective:   Blood pressure 114/75, pulse 62, temperature 97.9 F (36.6 C), temperature source Oral, height 5\' 6"  (1.676 m), weight 212 lb (96.2 kg), SpO2 98 %. Body mass index is 34.22 kg/m.  General: Cooperative, alert, well developed, in no acute distress. HEENT: Conjunctivae and lids unremarkable. Cardiovascular: Regular rhythm.  Lungs: Normal work of breathing. Neurologic: No focal deficits.   Lab Results  Component Value Date   CREATININE 0.77 12/07/2019   BUN 10 12/07/2019   NA 138 12/07/2019   K 4.2 12/07/2019   CL 105 12/07/2019   CO2 29 12/07/2019   Lab Results  Component Value Date   ALT 41 (H) 12/07/2019   AST 37 12/07/2019   ALKPHOS 102 12/07/2019   BILITOT 0.5 12/07/2019   Lab Results  Component Value Date   HGBA1C 5.3 12/07/2019   HGBA1C 5.7 (H) 10/01/2019   HGBA1C 5.4 12/04/2018   HGBA1C 5.6 11/27/2017   HGBA1C 5.6 04/12/2017  Lab Results  Component Value Date   INSULIN 15.6 10/01/2019   Lab Results  Component Value Date   TSH 2.56 12/07/2019   Lab Results  Component Value Date   CHOL 147 12/07/2019   HDL 46.60 12/07/2019   LDLCALC 77 12/07/2019   LDLDIRECT 141.8 07/04/2007   TRIG 117.0 12/07/2019   CHOLHDL 3 12/07/2019   Lab  Results  Component Value Date   WBC 3.5 (L) 12/07/2019   HGB 12.8 12/07/2019   HCT 39.0 12/07/2019   MCV 91.4 12/07/2019   PLT 178.0 12/07/2019   Lab Results  Component Value Date   IRON 89 10/01/2019   TIBC 335 10/01/2019   FERRITIN 178 (H) 10/01/2019    Obesity Behavioral Intervention Documentation for Insurance:   Approximately 15 minutes were spent on the discussion below.  ASK: We discussed the diagnosis of obesity with [name] today and Ladonna agreed to give [place] permission to discuss obesity behavioral modification therapy today.  ASSESS: Kyden has the diagnosis of obesity and her BMI today is 35.36. Axie is in the action stage of change.   ADVISE: Maevis was educated on the multiple health risks of obesity as well as the benefit of weight loss to improve her health. She was advised of the need for long term treatment and the importance of lifestyle modifications to improve her current health and to decrease her risk of future health problems.  AGREE: Multiple dietary modification options and treatment options were discussed and Jlah agreed to follow the recommendations documented in the above note.  ARRANGE: Therma was educated on the importance of frequent visits to treat obesity as outlined per CMS and USPSTF guidelines and agreed to schedule her next follow up appointment today.  Attestation Statements:   Reviewed by clinician on day of visit: allergies, medications, problem list, medical history, surgical history, family history, social history, and previous encounter notes.   Wilhemena Durie, am acting as transcriptionist for [employer], PA-C.  I have reviewed the above documentation for accuracy and completeness, and I agree with the above. Abby Potash, PA-C

## 2020-01-22 ENCOUNTER — Encounter: Payer: Self-pay | Admitting: Pulmonary Disease

## 2020-01-22 ENCOUNTER — Other Ambulatory Visit: Payer: Self-pay

## 2020-01-22 ENCOUNTER — Ambulatory Visit (INDEPENDENT_AMBULATORY_CARE_PROVIDER_SITE_OTHER): Payer: Medicare Other | Admitting: Pulmonary Disease

## 2020-01-22 VITALS — BP 134/76 | HR 64 | Temp 98.2°F | Ht 66.0 in | Wt 218.2 lb

## 2020-01-22 DIAGNOSIS — J301 Allergic rhinitis due to pollen: Secondary | ICD-10-CM | POA: Diagnosis not present

## 2020-01-22 DIAGNOSIS — J479 Bronchiectasis, uncomplicated: Secondary | ICD-10-CM

## 2020-01-22 DIAGNOSIS — G4733 Obstructive sleep apnea (adult) (pediatric): Secondary | ICD-10-CM

## 2020-01-22 NOTE — Patient Instructions (Addendum)
You were seen today by Lauraine Rinne, NP  for:   Nice seeing you in clinic today.  Great job with using your CPAP.  Lets improve your CPAP adherence by making sure putting the mask back on after getting up and going to the bathroom at night.  What start using either your flutter valve or your therapy vest in the morning after using hypertonic saline nebs.  As discussed today in clinic.  As for the ongoing occasional back pain that you are having please follow-up with primary care for further evaluation.  If you start having worsened sputum production, fevers, discolored mucus please let our office know.  We will see you back in 4 months with an appointment with Dr. Halford Chessman.  Take care and stay safe,  [name]  1. Obstructive sleep apnea  Continue to use your CPAP nightly When you get up to go the bathroom please make sure you are putting your mask back on's that way we can improve compliance  We recommend that you continue using your CPAP daily >>>Keep up the hard work using your device >>> Goal should be wearing this for the entire night that you are sleeping, at least 4 to 6 hours  Remember:  . Do not drive or operate heavy machinery if tired or drowsy.  . Please notify the supply company and office if you are unable to use your device regularly due to missing supplies or machine being broken.  . Work on maintaining a healthy weight and following your recommended nutrition plan  . Maintain proper daily exercise and movement  . Maintaining proper use of your device can also help improve management of other chronic illnesses such as: Blood pressure, blood sugars, and weight management.   BiPAP/ CPAP Cleaning:  >>>Clean weekly, with Dawn soap, and bottle brush.  Set up to air dry. >>> Wipe mask out daily with wet wipe or towelette    2. Bronchiectasis without complication (HCC)  Bronchiectasis: This is the medical term which indicates that you have damage, dilated airways making you  more susceptible to respiratory infection. Use a flutter valve 10 breaths twice a day or 4 to 5 breaths 4-5 times a day to help clear mucus out Let us know if you have cough with change in mucus color or fevers or chills.  At that point you would need an antibiotic. Maintain a healthy nutritious diet, eating whole foods Take your medications as prescribed   Use hypertonic saline nebs daily in the morning, can repeat in the afternoon if he feels symptomatic relief as well as to help with mucus production  Use therapy vest following hypertonic saline nebs or use flutter valve  3. Seasonal allergic rhinitis due to pollen  Continue Allegra  Continue Singulair  Follow Up:    Return in about 4 months (around 05/23/2020), or if symptoms worsen or fail to improve, for Follow up with Dr. Halford Chessman.   Please do your part to reduce the spread of COVID-19:      Reduce your risk of any infection  and COVID19 by using the similar precautions used for avoiding the common cold or flu:  [name] Wash your hands often with soap and warm water for at least 20 seconds.  If soap and water are not readily available, use an alcohol-based hand sanitizer with at least 60% alcohol.  . If coughing or sneezing, cover your mouth and nose by coughing or sneezing into the elbow areas of your shirt or coat, into  a tissue or into your sleeve (not your hands). Langley Gauss A MASK when in public  . Avoid shaking hands with others and consider head nods or verbal greetings only. . Avoid touching your eyes, nose, or mouth with unwashed hands.  . Avoid close contact with people who are sick. . Avoid places or events with large numbers of people in one location, like concerts or sporting events. . If you have some symptoms but not all symptoms, continue to monitor at home and seek medical attention if your symptoms worsen. . If you are having a medical emergency, call 911.   Elsa / e-Visit: [email or web address]         [facility]: [facility]: [phone]                   [facility]: [phone]     It is flu season:   >>> Best ways to protect herself from the flu: Receive the yearly flu vaccine, practice good hand hygiene washing with soap and also using hand sanitizer when available, eat a nutritious meals, get adequate rest, hydrate appropriately   Please contact the office if your symptoms worsen or you have concerns that you are not improving.   Thank you for choosing [facility] for your healthcare, and for allowing [place] to partner with you on your healthcare journey. I am thankful to be able to provide care to you today.   Wyn Quaker FNP-C

## 2020-01-22 NOTE — Assessment & Plan Note (Signed)
Plan: Continue Singulair Continue Allegra

## 2020-01-22 NOTE — Progress Notes (Signed)
Reviewed and agree with assessment/plan.   Chesley Mires, MD [facility] Pulmonary/Critical Care 01/22/2020, 2:14 PM Pager:  [phone]

## 2020-01-22 NOTE — Assessment & Plan Note (Signed)
Back to baseline Not using hypertonic saline nebs Only using flutter valve at night No worsened acute symptoms or recent antibiotics  Plan: Encourage patient to consider using hypertonic saline nebs in the morning followed by either flutter valve therapy vest Can always repeat again in the evening Follow-up in 4 to 6 months

## 2020-01-22 NOTE — Assessment & Plan Note (Signed)
Plan: Continue CPAP therapy Follow-up in 4 to 6 months

## 2020-01-22 NOTE — Progress Notes (Signed)
_0  ID: [name], female    DOB: 08/03/1951, 69 y.o.   MRN: [id number]  Chief Complaint  Patient presents with  . Follow-up    bronchiectasis     Referring provider: Tower, [name], MD  HPI:  69 year old female former smoker followed in our office for obstructive sleep apnea and bronchiectasis  PMH: Hypothyroidism, depression, hypertension, GERD, fibromyalgia, patent foreman ovale, prediabetes, restless leg syndrome, history of C. difficile infection, TMJ Smoker/ Smoking History: Former smoker.  Quit 1991.  15-pack-year smoking history Maintenance:  Hypertonic Saline nebs prn  Pt of: Dr. Halford Chessman  01/22/2020  - Visit   69 year old female former smoker followed in our office for obstructive sleep apnea and bronchiectasis.  She is followed by Dr. Halford Chessman.  She is completing a 107-monthfollow-up with our office today.  At last office visit she was treated with a course of prednisone for slow to resolve bronchiectatic exacerbation.  Patient feels that she is doing well.  She feels that her breathing is stable.  Unfortunately she is not using her therapy vest or flutter valve often in the morning.  She does use her flutter valve in the evening.  She does not feel that she is able to bring up much mucus.  She reports that when she is able to cough up mucus is usually a light yellow color.  She is not using the hypertonic saline nebs.  Patient is currently using a CPAP for treatment of obstructive sleep apnea.  She is using it every night.  Patient could improve her overall compliance as 11 of the days over the last 30 days have been less than 4 hours.  See CPAP compliance report listed below:  12/22/2019-01/20/2020-30 had a last 30 days use, 19 of those days greater than 4 hours, average usage 4 hours and 48 minutes, CPAP set pressure of 9, AHI 3.6  Patient reports that sometimes she gets up to go to the bathroom couple times a night she forgets to put her mask back on.  She reports is made more  difficult when she takes Ambien at night.  We will discuss this today.   Questionaires / Pulmonary Flowsheets:   ACT:  No flowsheet data found.  MMRC: mMRC Dyspnea Scale mMRC Score  10/23/2019 0    Epworth:  Results of the Epworth flowsheet 01/22/2020 08/04/2019  Sitting and reading 3 2  Watching TV 3 2  Sitting, inactive in a public place (e.g. a theatre or a meeting) 2 3  As a passenger in a car for an hour without a break 2 2  Lying down to rest in the afternoon when circumstances permit 0 2  Sitting and talking to someone 2 2  Sitting quietly after a lunch without alcohol 3 2  In a car, while stopped for a few minutes in traffic 1 1  Total score 16 16    Tests:   Pulmonary tests:  PFT 04/17/12 >> FEV1 2.60 (110%), FEV1% 85, TLC 4.05 (77%), DLCO 44% PFT 01/17/17 >> FEV1 2.38 (111%), FEV1% 90, TLC 3.85 (71%), DLCO 58%, no BD RAST 05/30/17 >> dust mites PFT 10/16/18 >> FEV1 2.27 (111%), FEV1% 90, TLC 4.00 (74%), DLCO 59%  Serology:  04/02/12 >> ACE 1 10/25/15 >> ESR 40, ACE 32 05/30/17 >> IgA, IgG, IgM normal 10/16/18 >> HP panel negative, ACE 25  Chest imaging:  CT chest 04/10/12 >> b/l lower lobe cylindrical BTX and some in upper lobes CT angio chest 10/26/15 >> patchy  GGO in periphery of lungs b/l, basilar BTX, no PE; no significant change compared to 2013 HRCT chest 10/08/18 >> atherosclerosis, basilar predominant cylindrical BTX, mild centrilobular/paraseptal emphysema, air trapping CT angio chest 03/24/19 >> Chronic lung changes with peribronchial thickening, bibasilar atelectasis and chronic interstitial disease/peripheral fibrosis at the lung bases  Sleep tests:  PSG 11/29/08 >> AHI 9 HST 11/10/15 >> AHI 7.1, SaO2 low 75% CPAP 07/04/19 to 08/02/19 >> used on 26 of 30 nights with average 4 hrs 26 min.  Average AHI 7.1 with CPAP 8 cm H2O.  Cardiac tests:  Echo9/24/18 >> mild LVH, EF 55 to 60%, grade 1 DD, mild TR   FENO:  No results found for: NITRICOXIDE  PFT: PFT  Results Latest Ref Rng & Units 10/16/2018 01/17/2017  FVC-Pre L 2.52 2.63  FVC-Predicted Pre % 96 98  FVC-Post L 2.49 2.52  FVC-Predicted Post % 94 94  Pre FEV1/FVC % % 90 90  Post FEV1/FCV % % 91 92  FEV1-Pre L 2.27 2.38  FEV1-Predicted Pre % 111 113  FEV1-Post L 2.26 2.32  DLCO UNC% % 59 58  DLCO COR %Predicted % 85 80  TLC L 4.00 3.85  TLC % Predicted % 74 71  RV % Predicted % 58 55    WALK:  No flowsheet data found.  Imaging: No results found.  Lab Results:  CBC    Component Value Date/Time   WBC 3.5 (L) 12/07/2019 1053   RBC 4.27 12/07/2019 1053   HGB 12.8 12/07/2019 1053   HGB 13.3 10/01/2019 1408   HGB 12.4 12/01/2014 0845   HCT 39.0 12/07/2019 1053   HCT 59.0 (H) 10/01/2019 1409   HCT 38.8 12/01/2014 0845   PLT 178.0 12/07/2019 1053   PLT 212 10/01/2019 1408   MCV 91.4 12/07/2019 1053   MCV 92 10/01/2019 1408   MCV 88.5 12/01/2014 0845   MCH 30.0 10/01/2019 1408   MCH 29.5 02/27/2017 1518   MCHC 32.8 12/07/2019 1053   RDW 13.1 12/07/2019 1053   RDW 11.8 10/01/2019 1408   RDW 12.7 12/01/2014 0845   LYMPHSABS 1.0 12/07/2019 1053   LYMPHSABS 1.0 10/01/2019 1408   LYMPHSABS 1.0 12/01/2014 0845   MONOABS 0.4 12/07/2019 1053   MONOABS 0.3 12/01/2014 0845   EOSABS 0.2 12/07/2019 1053   EOSABS 0.3 10/01/2019 1408   EOSABS 0.1 03/13/2007 1249   BASOSABS 0.0 12/07/2019 1053   BASOSABS 0.1 10/01/2019 1408   BASOSABS 0.0 12/01/2014 0845    BMET    Component Value Date/Time   NA 138 12/07/2019 1053   NA 140 10/01/2019 1408   NA 141 12/01/2014 0847   K 4.2 12/07/2019 1053   K 3.8 12/01/2014 0847   CL 105 12/07/2019 1053   CO2 29 12/07/2019 1053   CO2 26 12/01/2014 0847   GLUCOSE 101 (H) 12/07/2019 1053   GLUCOSE 175 (H) 12/01/2014 0847   BUN 10 12/07/2019 1053   BUN 10 10/01/2019 1408   BUN 10.6 12/01/2014 0847   CREATININE 0.77 12/07/2019 1053   CREATININE 0.82 02/27/2017 1518   CREATININE 0.8 12/01/2014 0847   CALCIUM 9.6 12/07/2019 1053    CALCIUM 9.8 12/01/2014 0847   GFRNONAA 71 10/01/2019 1408   GFRNONAA 74 02/27/2017 1518   GFRAA 82 10/01/2019 1408   GFRAA 85 02/27/2017 1518    BNP    Component Value Date/Time   BNP 92.4 03/17/2019 1555   BNP 30.2 08/16/2016 1452    ProBNP    Component Value  Date/Time   PROBNP 89 04/26/2017 1451    Specialty Problems      Pulmonary Problems   Allergic rhinitis    Qualifier: Diagnosis of  By: Glori Bickers MD, Carmell Austria       Obstructive sleep apnea    NPSG 2010:  AHI 9/hr      Dyspnea on exertion    CT chest 2010:  Minimal LN, no ISLD, a few patchy areas of nodularity.   PFT's 2010:  No obstruction, no restriction, DLCO 51% but corrects with Av. PFT"s 2013:  No obstruction, very mild restriction, DLCO 44% pred but corrects with Av.  CT chest 2013:  No LN, mild bronchiectasis in LL, a few patchy areas of scarring, no ISLD       Globus pharyngeus   Nasopharyngitis, chronic   Bronchiectasis with (acute) exacerbation (HCC)   Bronchiectasis without complication (HCC)      Allergies  Allergen Reactions  . Ace Inhibitors Swelling    Angioedema; makes tongue "break out"   . Amitriptyline Hcl Other (See Comments)     makes her too sleepy!  . Atorvastatin Other (See Comments)    SEVERE MYALGIA  . Cymbalta [Duloxetine Hcl] Nausea Only and Other (See Comments)    Sleepiness/ sick  . Dilantin [Phenytoin Sodium Extended] Rash    Severe rash  . Paroxetine Nausea Only    Rapid heartbeat  . Ramipril Other (See Comments)    TONGUE ULCERS   . Rosuvastatin Other (See Comments)    SEVERE MYALGIA  . Carbamazepine Rash  . Codeine Itching  . Iodine-131 Other (See Comments)    Swelling at IV site only, no swelling or SOB around mouth.!  . Phenytoin Sodium Extended Rash  . Wellbutrin [Bupropion] Palpitations    Immunization History  Administered Date(s) Administered  . Fluad Quad(high Dose 65+) 04/17/2019  . H1N1 07/24/2008  . Influenza Split 07/17/2011, 09/27/2011,  04/29/2012  . Influenza Whole 07/20/2004, 07/04/2007, 05/07/2008, 05/19/2009, 05/17/2010  . Influenza, High Dose Seasonal PF 05/30/2017, 05/21/2018  . Influenza,inj,Quad PF,6+ Mos 05/20/2013, 05/06/2014, 05/17/2015, 05/15/2016  . Influenza-Unspecified 05/07/2018  . PFIZER SARS-COV-2 Vaccination 09/25/2019, 10/16/2019  . Pneumococcal Conjugate-13 11/22/2015  . Pneumococcal Polysaccharide-23 05/20/2002, 05/07/2008, 06/25/2014  . Td 09/07/2009  . Zoster 09/21/2011    Past Medical History:  Diagnosis Date  . AICD (automatic cardioverter/defibrillator) present   . Anxiety   . Arthritis   . Back pain   . 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 Bronchiectasis ([place])   . Cerebral aneurysm, nonruptured    had a clip put in  . CHF (congestive heart failure) ([place])   . Clostridium difficile infection   . Depressive disorder, not elsewhere classified   . Diverticulosis of colon (without mention of hemorrhage)   . Esophageal reflux   . Fatty liver   . Fibromyalgia   . Gastritis   . GERD (gastroesophageal reflux disease)   . GI bleed 2004  . Glaucoma   . Hiatal hernia   . Hyperlipidemia   . Hypertension   . Hypothyroidism   . Internal hemorrhoids   . Joint pain   . Obstructive sleep apnea (adult) (pediatric)   . Osteoarthritis   . Ostium secundum type atrial septal defect   . Other chronic nonalcoholic liver disease   . Other pulmonary embolism and infarction   . Palpitations   . Paroxysmal ventricular tachycardia ([place])   . Personal history of radiation therapy   . Pneumonia  history  . Presence of permanent cardiac pacemaker   . PUD (peptic ulcer disease)   . Radiation 02/03/15-03/10/15   Right Breast  . Sarcoid    per pt , not sure  . Schatzki's ring   . Shortness of breath   . Sleep apnea   . Stroke [facility]) 2013   tia/ pt feels it was around 2008 0r 2009  . Takotsubo syndrome   . Tubular adenoma of colon   . Unspecified transient cerebral  ischemia   . Unspecified vitamin D deficiency     Tobacco History: Social History   Tobacco Use  Smoking Status Former Smoker  . Packs/day: 1.00  . Years: 15.00  . Pack years: 15.00  . Types: Cigarettes  . Quit date: 08/20/1989  . Years since quitting: 30.4  Smokeless Tobacco Never Used  Tobacco Comment   Started at 77; less than 1 PPD   Counseling given: Not Answered Comment: Started at 27; less than 1 PPD   Continue to not smoke  Outpatient Encounter Medications as of 01/22/2020  Medication Sig  . albuterol (PROVENTIL) (2.5 MG/3ML) 0.083% nebulizer solution USE 1 VIAL IN NEBULIZER EVERY 6 HOURS - and as needed  . albuterol (VENTOLIN HFA) 108 (90 Base) MCG/ACT inhaler Inhale 2 puffs into the lungs every 6 (six) hours as needed for wheezing or shortness of breath.  . Ascorbic Acid (VITAMIN C) 1000 MG tablet Take 1,000 mg by mouth daily.  [name] aspirin 81 MG tablet Take 81 mg by mouth daily.  . baclofen (LIORESAL) 10 MG tablet Take 10 mg by mouth at bedtime.  . calcium carbonate (OS-CAL - DOSED IN MG OF ELEMENTAL CALCIUM) 1250 (500 Ca) MG tablet Take 1 tablet by mouth.  . carvedilol (COREG) 12.5 MG tablet TAKE 1 TABLET TWICE DAILY  . cholecalciferol (VITAMIN D) 1000 UNITS tablet Take 5,000 Units by mouth daily.   . fexofenadine (ALLEGRA) 180 MG tablet Take 180 mg by mouth daily as needed for allergies or rhinitis.  . fluticasone (FLONASE) 50 MCG/ACT nasal spray Place 2 sprays into both nostrils daily as needed for allergies or rhinitis.  . furosemide (LASIX) 40 MG tablet Take 20 mg by mouth daily. 20-75m  . hydroxypropyl methylcellulose / hypromellose (ISOPTO TEARS / GONIOVISC) 2.5 % ophthalmic solution Place 1 drop into both eyes daily.  .Marland Kitchenlatanoprost (XALATAN) 0.005 % ophthalmic solution Place 1 drop into both eyes at bedtime.  .Marland Kitchenlevothyroxine (SYNTHROID) 50 MCG tablet Take 1 tablet (50 mcg total) by mouth daily.  . magnesium oxide (MAG-OX) 400 MG tablet Take 1 tablet (400 mg  total) by mouth 2 (two) times daily.  . Magnesium Oxide 400 (240 Mg) MG TABS Take 1 tablet (400 mg total) by mouth daily.  . metFORMIN (GLUCOPHAGE) 500 MG tablet Take 1 tablet (500 mg total) by mouth 2 (two) times daily.  . mometasone (ELOCON) 0.1 % lotion Apply 1 application topically daily as needed (rash).   . montelukast (SINGULAIR) 10 MG tablet TAKE 1 TABLET AT BEDTIME  . Multiple Vitamins-Minerals (MULTIVITAMIN PO) Take 1 tablet by mouth daily.  . pantoprazole (PROTONIX) 40 MG tablet Take 1 tablet (40 mg total) by mouth daily.  . polyethylene glycol powder (GLYCOLAX/MIRALAX) powder Take 17 grams by mouth daily  . potassium chloride (K-DUR) 10 MEQ tablet TAKE 1/2 TABLET (5MEQ TOTAL) EVERY DAY  . Respiratory Therapy Supplies (FLUTTER) DEVI 1 Device by Does not apply route as needed.  . simvastatin (ZOCOR) 40 MG tablet TAKE 1/2 TABLET EVERY  DAY  . sodium chloride HYPERTONIC 3 % nebulizer solution Take by nebulization 2 (two) times daily as needed for cough. 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  . traMADol (ULTRAM) 50 MG tablet Take 1 tablet (50 mg total) by mouth 2 (two) times daily.  [name] zinc gluconate 50 MG tablet Take 50 mg by mouth daily.  [name] zolpidem (AMBIEN) 10 MG tablet TAKE 1 TABLET BY MOUTH AT BEDTIME AS NEEDED FOR SLEEP   No facility-administered encounter medications on file as of 01/22/2020.     Review of Systems  Review of Systems  Constitutional: Positive for fatigue. Negative for activity change and fever.  HENT: Negative for sinus pressure, sinus pain and sore throat.   Respiratory: Positive for cough. Negative for shortness of breath and wheezing.   Cardiovascular: Negative for chest pain and palpitations.  Musculoskeletal: Negative for arthralgias.  Neurological: Negative for dizziness.  Psychiatric/Behavioral: Negative for sleep disturbance. The patient is not nervous/anxious.      Physical Exam  BP 134/76 (BP Location: Left Arm, Cuff Size: Normal)   Pulse 64   Temp 98.2 F  (36.8 C) (Oral)   Ht _0  (1.676 m)   Wt 218 lb 3.2 oz (99 kg)   LMP  (LMP Unknown)   SpO2 96%   BMI 35.22 kg/m   Wt Readings from Last 5 Encounters:  01/22/20 218 lb 3.2 oz (99 kg)  01/07/20 219 lb (99.3 kg)  01/06/20 212 lb (96.2 kg)  01/04/20 219 lb 6.4 oz (99.5 kg)  12/21/19 220 lb (99.8 kg)    BMI Readings from Last 5 Encounters:  01/22/20 35.22 kg/m  01/07/20 35.35 kg/m  01/06/20 34.22 kg/m  01/04/20 35.41 kg/m  12/21/19 35.51 kg/m     Physical Exam Vitals and nursing note reviewed.  Constitutional:      General: She is not in acute distress.    Appearance: Normal appearance. She is obese.  HENT:     Head: Normocephalic and atraumatic.     Right Ear: External ear normal.     Left Ear: External ear normal.     Mouth/Throat:     Mouth: Mucous membranes are moist.     Pharynx: Oropharynx is clear.  Eyes:     Pupils: Pupils are equal, round, and reactive to light.  Cardiovascular:     Rate and Rhythm: Normal rate and regular rhythm.     Pulses: Normal pulses.     Heart sounds: Normal heart sounds. No murmur.  Pulmonary:     Effort: Pulmonary effort is normal. No respiratory distress.     Breath sounds: Normal breath sounds. No decreased air movement. No decreased breath sounds, wheezing or rales.  Musculoskeletal:     Cervical back: Normal range of motion.     Right lower leg: No edema.     Left lower leg: No edema.  Skin:    General: Skin is warm and dry.     Capillary Refill: Capillary refill takes less than 2 seconds.  Neurological:     General: No focal deficit present.     Mental Status: She is alert and oriented to person, place, and time. Mental status is at baseline.     Gait: Gait normal.  Psychiatric:        Mood and Affect: Mood normal.        Behavior: Behavior normal.        Thought Content: Thought content normal.        Judgment: Judgment normal.  Assessment & Plan:   Obstructive sleep apnea Plan: Continue CPAP  therapy Follow-up in 4 to 6 months  Bronchiectasis without complication (HCC) Back to baseline Not using hypertonic saline nebs Only using flutter valve at night No worsened acute symptoms or recent antibiotics  Plan: Encourage patient to consider using hypertonic saline nebs in the morning followed by either flutter valve therapy vest Can always repeat again in the evening Follow-up in 4 to 6 months  Allergic rhinitis Plan: Continue Singulair Continue Allegra    Return in about 4 months (around 05/23/2020), or if symptoms worsen or fail to improve, for Follow up with Dr. Halford Chessman.   Lauraine Rinne, NP 01/22/2020   This appointment required 32 minutes of patient care (this includes precharting, chart review, review of results, face-to-face care, etc.).

## 2020-02-01 ENCOUNTER — Encounter: Payer: Self-pay | Admitting: Physical Medicine and Rehabilitation

## 2020-02-01 ENCOUNTER — Encounter
Payer: Medicare Other | Attending: Physical Medicine and Rehabilitation | Admitting: Physical Medicine and Rehabilitation

## 2020-02-01 ENCOUNTER — Other Ambulatory Visit: Payer: Self-pay

## 2020-02-01 VITALS — BP 124/72 | HR 66 | Temp 97.3°F | Ht 66.0 in | Wt 215.0 lb

## 2020-02-01 DIAGNOSIS — M25562 Pain in left knee: Secondary | ICD-10-CM | POA: Insufficient documentation

## 2020-02-01 DIAGNOSIS — M7521 Bicipital tendinitis, right shoulder: Secondary | ICD-10-CM | POA: Diagnosis not present

## 2020-02-01 DIAGNOSIS — M7918 Myalgia, other site: Secondary | ICD-10-CM | POA: Insufficient documentation

## 2020-02-01 DIAGNOSIS — M25851 Other specified joint disorders, right hip: Secondary | ICD-10-CM | POA: Insufficient documentation

## 2020-02-01 DIAGNOSIS — Z79891 Long term (current) use of opiate analgesic: Secondary | ICD-10-CM | POA: Diagnosis present

## 2020-02-01 DIAGNOSIS — M542 Cervicalgia: Secondary | ICD-10-CM | POA: Diagnosis not present

## 2020-02-01 DIAGNOSIS — M170 Bilateral primary osteoarthritis of knee: Secondary | ICD-10-CM | POA: Diagnosis present

## 2020-02-01 DIAGNOSIS — M25561 Pain in right knee: Secondary | ICD-10-CM | POA: Diagnosis present

## 2020-02-01 DIAGNOSIS — M797 Fibromyalgia: Secondary | ICD-10-CM | POA: Diagnosis present

## 2020-02-01 DIAGNOSIS — R7303 Prediabetes: Secondary | ICD-10-CM | POA: Diagnosis not present

## 2020-02-01 DIAGNOSIS — Z5181 Encounter for therapeutic drug level monitoring: Secondary | ICD-10-CM | POA: Insufficient documentation

## 2020-02-01 DIAGNOSIS — G8929 Other chronic pain: Secondary | ICD-10-CM | POA: Diagnosis present

## 2020-02-01 NOTE — Patient Instructions (Signed)
Plan: 1. Has Orthopedist- talk to Ortho about doing R knee replacement with blocks and conscious sedation? 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   2. steroid injection was performed at bicipital tendons and R rotator cuff injection using 1% plain Lidocaine and 20mg  /1/2cc of Kenalog. This was well tolerated.  Cleaned with betadine x3 and allowed to dry- then alcohol then injected using 27 gauge 1.5 inch needle- no bleeding or complications.    F/U in 3 months for steroid injections of shoulders/biceps tendon and R rotator cuff steroid injection.  Lidocaine will kick in 15 minutes- and wear off tonight- the steroid will kick in tomorrow within 24 hours and take up to 72 hours to fully kick in.  3. Don't need refill Tramadol - gave 5 RFs on last Rx.   4. Don't overdo it in the first few days after injections.   5. F/U - 2 months for R trochanteric bursa injection

## 2020-02-01 NOTE — Progress Notes (Signed)
Patient is a 69 yr old R handed female with hx of Bronchiesctasis, CHF, prediabetes- with A1c of 5.3- - not on blood thinners, has a defibrillator, GERD, and fibromyalgia issues- dx'd 10 years ago. Here for trigger point injections.     Did like the trigger point injections. Muscles and neck feels better and back feels better.    Got tramadol- takes 1-2x/day  Exercising has helped pain a lot as well.  Has done something to R shoulder- it's catching- when reaches out.    Has been working out in [employer] and doing weights. Stopped doing particular weight for 1+ weeks.     Exam: Awake, alert, appropriate, NAD TTP over R biceps tendon origin TTP over AC joint and TTP rotator cuff joint Significant pain with biceps pull Impingement syndrome based on Empty can test   R trochanternic bursitis injection went well-    Plan: 1. Has Orthopedist- talk to Ortho about doing R knee replacement with blocks and conscious sedation? 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   2. steroid injection was performed at bicipital tendons and R rotator cuff injection using 1% plain Lidocaine and 20mg  /1/2cc of Kenalog. This was well tolerated.  Cleaned with betadine x3 and allowed to dry- then alcohol then injected using 27 gauge 1.5 inch needle- no bleeding or complications.    F/U in 3 months for steroid injections of shoulders/biceps tendon and R rotator cuff steroid injection.  Lidocaine will kick in 15 minutes- and wear off tonight- the steroid will kick in tomorrow within 24 hours and take up to 72 hours to fully kick in.  3. Don't need refill Tramadol - gave 5 RFs on last Rx.   4. Don't overdo it in the first few days after injections.   5. F/U - 2 months for R trochanteric bursa injection  I spent a total of 25 minutes on appointment.

## 2020-02-02 ENCOUNTER — Encounter (INDEPENDENT_AMBULATORY_CARE_PROVIDER_SITE_OTHER): Payer: Self-pay | Admitting: Family Medicine

## 2020-02-02 ENCOUNTER — Ambulatory Visit (INDEPENDENT_AMBULATORY_CARE_PROVIDER_SITE_OTHER): Payer: Medicare Other | Admitting: Family Medicine

## 2020-02-02 ENCOUNTER — Other Ambulatory Visit: Payer: Self-pay

## 2020-02-02 VITALS — BP 128/60 | HR 61 | Temp 97.7°F | Ht 66.0 in | Wt 212.0 lb

## 2020-02-02 DIAGNOSIS — E7800 Pure hypercholesterolemia, unspecified: Secondary | ICD-10-CM

## 2020-02-02 DIAGNOSIS — R7303 Prediabetes: Secondary | ICD-10-CM

## 2020-02-02 DIAGNOSIS — E669 Obesity, unspecified: Secondary | ICD-10-CM

## 2020-02-02 DIAGNOSIS — L304 Erythema intertrigo: Secondary | ICD-10-CM

## 2020-02-02 DIAGNOSIS — G478 Other sleep disorders: Secondary | ICD-10-CM

## 2020-02-02 DIAGNOSIS — Z6834 Body mass index (BMI) 34.0-34.9, adult: Secondary | ICD-10-CM | POA: Diagnosis not present

## 2020-02-02 DIAGNOSIS — R7401 Elevation of levels of liver transaminase levels: Secondary | ICD-10-CM | POA: Diagnosis not present

## 2020-02-02 MED ORDER — TOPIRAMATE 25 MG PO TABS: 25.0000 mg | ORAL_TABLET | Freq: Every day | ORAL | 0 refills | Status: DC

## 2020-02-02 NOTE — Progress Notes (Signed)
Chief Complaint:   OBESITY [name] is here to discuss her progress with her obesity treatment plan along with follow-up of her obesity related diagnoses. [name] is on the Category 2 Plan and states she is following her eating plan approximately 25% of the time. [name] states she is doing cardio and strength training for 55 minutes 4 times per week.  Today's visit was #: 7 Starting weight: 223 lbs Starting date: 10/01/2019 Today's weight: 212 lbs Today's date: 02/02/2020 Total lbs lost to date: 11 lbs Total lbs lost since last in-office visit: 0  Interim History: [name] says she has been getting up in the middle of the night to eat.  Worsening over the last 2 weeks.  She denies hunger.  She is waking at 2-3 am.  Subjective:   1. Hypercholesterolemia [name] has hyperlipidemia and has been trying to improve her cholesterol levels with intensive lifestyle modification including a low saturated fat diet, exercise and weight loss. She denies any chest pain, claudication or myalgias.  Lab Results  Component Value Date   ALT 41 (H) 12/07/2019   AST 37 12/07/2019   ALKPHOS 102 12/07/2019   BILITOT 0.5 12/07/2019   Lab Results  Component Value Date   CHOL 147 12/07/2019   HDL 46.60 12/07/2019   LDLCALC 77 12/07/2019   LDLDIRECT 141.8 07/04/2007   TRIG 117.0 12/07/2019   CHOLHDL 3 12/07/2019   2. Prediabetes [name] has a diagnosis of prediabetes based on her elevated HgA1c and was informed this puts her at greater risk of developing diabetes. She continues to work on diet and exercise to decrease her risk of diabetes. She denies nausea or hypoglycemia.  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 Her BMI is over 34.2. She denies abdominal pain or jaundice and has never been told of any liver problems in the past. She denies  excessive alcohol intake.  Lab Results  Component Value Date   ALT 41 (H) 12/07/2019   AST 37 12/07/2019   ALKPHOS 102 12/07/2019   BILITOT 0.5 12/07/2019   4. Intertrigo This is improving.    5. Nocturnal sleep-related eating disorder [name] says that her night eating syndrome has been worsening recently.  Assessment/Plan:   1. Hypercholesterolemia Cardiovascular risk and specific lipid/LDL goals reviewed.  We discussed several lifestyle modifications today and [name] will continue to work on diet, exercise and weight loss efforts. Orders and follow up as documented in patient record.   Counseling Intensive lifestyle modifications are the first line treatment for this issue. . Dietary changes: Increase soluble fiber. Decrease simple carbohydrates. . Exercise changes: Moderate to vigorous-intensity aerobic activity 150 minutes per week if tolerated. . Lipid-lowering medications: see documented in medical record.  2. Prediabetes [name] will continue to work on weight loss, exercise, and decreasing simple carbohydrates to help decrease the risk of diabetes.   3. 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 [name] was educated the importance of weight loss. [name] agreed to continue with her weight loss efforts with healthier diet and exercise as an essential part of her treatment plan.  4. Intertrigo Will continue to monitor.  5. Nocturnal sleep-related eating disorder Patient was referred to [name], our Bariatric Psychologist, for  evaluation due to her elevated PHQ-9 score and significant struggles with emotional eating.  Orders - topiramate (TOPAMAX) 25 MG tablet; Take 1 tablet (25 mg total) by mouth at bedtime.  Dispense: 30 tablet; Refill: 0  6. Class 1 obesity with serious comorbidity and body mass index (BMI) of 34.0 to 34.9 in adult, unspecified obesity  type [name] is currently in the action stage of change. As such, her goal is to continue with weight loss efforts. She has agreed to the Category 2 Plan.   Exercise goals: For substantial health benefits, adults should do at least 150 minutes (2 hours and 30 minutes) a week of moderate-intensity, or 75 minutes (1 hour and 15 minutes) a week of vigorous-intensity aerobic physical activity, or an equivalent combination of moderate- and vigorous-intensity aerobic activity. Aerobic activity should be performed in episodes of at least 10 minutes, and preferably, it should be spread throughout the week.  Behavioral modification strategies: increasing lean protein intake and increasing water intake.  [name] has agreed to follow-up with our clinic in 2-3 weeks. She was informed of the importance of frequent follow-up visits to maximize her success with intensive lifestyle modifications for her multiple health conditions.   Objective:   Blood pressure 128/60, pulse 61, temperature 97.7 F (36.5 C), temperature source Oral, height 5\' 6"  (1.676 m), weight 212 lb (96.2 kg), SpO2 98 %. Body mass index is 34.22 kg/m.  General: Cooperative, alert, well developed, in no acute distress. HEENT: Conjunctivae and lids unremarkable. Cardiovascular: Regular rhythm.  Lungs: Normal work of breathing. Neurologic: No focal deficits.   Lab Results  Component Value Date   CREATININE 0.77 12/07/2019   BUN 10 12/07/2019   NA 138 12/07/2019   K 4.2 12/07/2019   CL 105 12/07/2019   CO2 29 12/07/2019   Lab Results  Component Value Date   ALT 41 (H) 12/07/2019   AST 37 12/07/2019   ALKPHOS 102 12/07/2019   BILITOT 0.5 12/07/2019   Lab Results  Component Value Date   HGBA1C 5.3 12/07/2019   HGBA1C 5.7 (H) 10/01/2019   HGBA1C 5.4 12/04/2018   HGBA1C 5.6 11/27/2017   HGBA1C 5.6 04/12/2017   Lab Results  Component Value Date   INSULIN 15.6 10/01/2019   Lab Results  Component Value Date   TSH 2.56  12/07/2019   Lab Results  Component Value Date   CHOL 147 12/07/2019   HDL 46.60 12/07/2019   LDLCALC 77 12/07/2019   LDLDIRECT 141.8 07/04/2007   TRIG 117.0 12/07/2019   CHOLHDL 3 12/07/2019   Lab Results  Component Value Date   WBC 3.5 (L) 12/07/2019   HGB 12.8 12/07/2019   HCT 39.0 12/07/2019   MCV 91.4 12/07/2019   PLT 178.0 12/07/2019   Lab Results  Component Value Date   IRON 89 10/01/2019   TIBC 335 10/01/2019   FERRITIN 178 (H) 10/01/2019   Obesity Behavioral Intervention:   Approximately 15 minutes were spent on the discussion below.  ASK: We discussed the diagnosis of obesity with [name] today and [name] agreed to give [place] permission to discuss obesity behavioral modification therapy today.  ASSESS: [name] has the diagnosis of obesity and her BMI today is 34.2. [name] is in the action stage of change.   ADVISE: [name] was educated on the multiple health risks of obesity as well as the benefit of weight loss to improve her health. She was advised of the need for long term treatment and the importance of lifestyle modifications to improve  her current health and to decrease her risk of future health problems.  AGREE: Multiple dietary modification options and treatment options were discussed and [name] agreed to follow the recommendations documented in the above note.  ARRANGE: [name] was educated on the importance of frequent visits to treat obesity as outlined per CMS and USPSTF guidelines and agreed to schedule her next follow up appointment today.  Attestation Statements:   Reviewed by clinician on day of visit: allergies, medications, problem list, medical history, surgical history, family history, social history, and previous encounter notes.  I, Water quality scientist, CMA, am acting as transcriptionist for Briscoe Deutscher, DO  I have reviewed the above documentation for accuracy and completeness, and I agree with the above. Briscoe Deutscher, DO

## 2020-02-04 ENCOUNTER — Other Ambulatory Visit (INDEPENDENT_AMBULATORY_CARE_PROVIDER_SITE_OTHER): Payer: Self-pay | Admitting: Family Medicine

## 2020-02-04 DIAGNOSIS — R7303 Prediabetes: Secondary | ICD-10-CM

## 2020-02-08 MED ORDER — METFORMIN HCL 500 MG PO TABS: 500.0000 mg | ORAL_TABLET | Freq: Two times a day (BID) | ORAL | 0 refills | Status: DC

## 2020-02-10 ENCOUNTER — Telehealth: Payer: Self-pay | Admitting: *Deleted

## 2020-02-10 NOTE — Telephone Encounter (Signed)
Patient called stating that she has been having cramps in her legs and feet off and on for about a month. Patient stated that the cramps occur during the night and they wake her up. Patient denies any other symptoms. Patient was offered an appointment this week with another provider since Dr. Glori Bickers is out of the office. Patient stated that this has been going on for about a month and she feels that she can wait until next week. Patient stated that she has not missed any of her potassium pills. Patient was given ER precautions and she verbalized understanding. Patient was advised if she decides that she needs to be seen before next week to call the office back. Patient scheduled to see Dr. Glori Bickers 02/16/20 at 10:00 am. [place]

## 2020-02-10 NOTE — Telephone Encounter (Signed)
Aware- I will see her then If this worsens she needs to see first available or seek care out of the office

## 2020-02-16 ENCOUNTER — Ambulatory Visit (INDEPENDENT_AMBULATORY_CARE_PROVIDER_SITE_OTHER): Payer: Medicare Other | Admitting: Family Medicine

## 2020-02-16 ENCOUNTER — Other Ambulatory Visit: Payer: Self-pay

## 2020-02-16 ENCOUNTER — Encounter: Payer: Self-pay | Admitting: Family Medicine

## 2020-02-16 VITALS — BP 128/68 | HR 66 | Temp 96.9°F | Ht 66.0 in | Wt 214.2 lb

## 2020-02-16 DIAGNOSIS — R252 Cramp and spasm: Secondary | ICD-10-CM | POA: Diagnosis not present

## 2020-02-16 DIAGNOSIS — E039 Hypothyroidism, unspecified: Secondary | ICD-10-CM | POA: Diagnosis not present

## 2020-02-16 DIAGNOSIS — I10 Essential (primary) hypertension: Secondary | ICD-10-CM

## 2020-02-16 DIAGNOSIS — R300 Dysuria: Secondary | ICD-10-CM | POA: Insufficient documentation

## 2020-02-16 DIAGNOSIS — R3915 Urgency of urination: Secondary | ICD-10-CM

## 2020-02-16 LAB — BASIC METABOLIC PANEL
BUN: 21 mg/dL (ref 6–23)
CO2: 28 mEq/L (ref 19–32)
Calcium: 10 mg/dL (ref 8.4–10.5)
Chloride: 104 mEq/L (ref 96–112)
Creatinine, Ser: 0.93 mg/dL (ref 0.40–1.20)
GFR: 71.83 mL/min (ref >60.00)
Glucose, Bld: 104 mg/dL — ABNORMAL HIGH (ref 70–99)
Potassium: 4.2 mEq/L (ref 3.5–5.1)
Sodium: 138 mEq/L (ref 135–145)

## 2020-02-16 LAB — POCT URINALYSIS DIPSTICK
Bilirubin, UA: NEGATIVE
Glucose, UA: NEGATIVE
Ketones, UA: NEGATIVE
Nitrite, UA: NEGATIVE
Protein, UA: POSITIVE — AB
Spec Grav, UA: 1.025 (ref 1.010–1.025)
Urobilinogen, UA: 0.2 E.U./dL
pH, UA: 6 (ref 5.0–8.0)

## 2020-02-16 LAB — TSH: TSH: 1.86 u[IU]/mL (ref 0.35–4.50)

## 2020-02-16 LAB — MAGNESIUM: Magnesium: 2.3 mg/dL (ref 1.5–2.5)

## 2020-02-16 MED ORDER — CEPHALEXIN 250 MG PO CAPS: 250.0000 mg | ORAL_CAPSULE | Freq: Two times a day (BID) | ORAL | 0 refills | Status: DC

## 2020-02-16 NOTE — Assessment & Plan Note (Signed)
ua with trace leukocytes tx with keflex for 5 d  Fluids enc  Urine culture pending

## 2020-02-16 NOTE — Assessment & Plan Note (Signed)
Pt does not take lasix daily  5 meq K daily  Takes mag as well   Lab today for bmp and mag and TSH Disc stretching  Mustard/tumeric may also help  Nl exam-good perfusion of feet

## 2020-02-16 NOTE — Assessment & Plan Note (Signed)
Nl TSH in April Re check today in light of leg cramps Otherwise no changes

## 2020-02-16 NOTE — Patient Instructions (Addendum)
Take keflex as directed for possible uti   Labs today for the leg/foot cramping   Drink lots of water   Continue magnesium  Continue the potassium  Start yoga as planned

## 2020-02-16 NOTE — Progress Notes (Signed)
Subjective:    Patient ID: [name], female    DOB: 13-Feb-1951, 69 y.o.   MRN: [id number]  This visit occurred during the SARS-CoV-2 public health emergency.  Safety protocols were in place, including screening questions prior to the visit, additional usage of staff PPE, and extensive cleaning of exam room while observing appropriate contact time as indicated for disinfecting solutions.    HPI Pt presents for cramps in legs and feet for about a month  Also urinary symptoms   Wt Readings from Last 3 Encounters:  02/16/20 214 lb 3.2 oz (97.2 kg)  02/02/20 212 lb (96.2 kg)  02/01/20 215 lb (97.5 kg)   34.57 kg/m   Does strength training and bike Starting a yoga class soon    Urinary symptoms started on Saturday  Intense urge to urinate -then not much volume Then discomfort to urinate -still not comfortable Kept drinking cranberry juice  Also water  She held her lasix  Some discomfort also in bladder area/pressure  No blood in urine  No nausea  Has to wear pads all the time   UA shows trace of leukocytes  Results for orders placed or performed in visit on 02/16/20  Urinalysis Dipstick  Result Value Ref Range   Color, UA light yellow    Clarity, UA cloudy    Glucose, UA Negative Negative   Bilirubin, UA neg    Ketones, UA neg    Spec Grav, UA 1.025 1.010 - 1.025   Blood, UA +-    pH, UA 6.0 5.0 - 8.0   Protein, UA Positive (A) Negative   Urobilinogen, UA 0.2 0.2 or 1.0 E.U./dL   Nitrite, UA neg    Leukocytes, UA Trace (A) Negative   Appearance     Odor     *Note: Due to a large number of results and/or encounters for the requested time period, some results have not been displayed. A complete set of results can be found in Results Review.     Takes miralax every day  Also magnesium   Takes baclofen at night   Foot/leg cramps for a mo Toes/feet and then cramps in lower legs - about 3-4 am  Mustard -- will take a spoon at night      H/o HTN with non  ischemic cardiomyopathy   bp is stable today  No cp or palpitations or headaches or edema  No side effects to medicines  BP Readings from Last 3 Encounters:  02/16/20 128/68  02/02/20 128/60  02/01/20 124/72     Pulse Readings from Last 3 Encounters:  02/16/20 66  02/02/20 61  02/01/20 66   Lab Results  Component Value Date   CREATININE 0.77 12/07/2019   BUN 10 12/07/2019   NA 138 12/07/2019   K 4.2 12/07/2019   CL 105 12/07/2019   CO2 29 12/07/2019     Takes carvedilol Lasix  20-40 mg as needed  Also K dur 5 meq every day   No vomiting  More loose stools with magnesium supplement  (from Dr Caryl Comes)   Ankles are not swollen  Mag ox is on medication list   Hypothyroidism  Pt has no clinical changes No change in energy level/ hair or skin/ edema and no tremor Lab Results  Component Value Date   TSH 2.56 12/07/2019     Takes topamax   Also simvastatin for cholesterol   Patient Active Problem List   Diagnosis Date Noted  . Leg cramps 02/16/2020  .  Dysuria 02/16/2020  . Right hip impingement syndrome 02/01/2020  . Right bicipital tenosynovitis 02/01/2020  . Bronchiectasis without complication ([place]) 65/78/4696  . Myofascial pain 12/21/2019  . Estrogen deficiency 12/10/2019  . Obesity (BMI 30-39.9) 12/10/2019  . Bronchiectasis with (acute) exacerbation ([place]) 10/23/2019  . Elevated LFTs 10/15/2019  . Lower abdominal pain 09/01/2019  . Neck pain 10/25/2017  . Paresthesias in left hand 10/25/2017  . Paresthesia of foot, bilateral 04/12/2017  . Primary osteoarthritis of both hands 09/27/2016  . Primary osteoarthritis of both knees 09/27/2016  . 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  . Nasopharyngitis, 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/2016  . S/P total knee replacement 02/13/2016  . Chronic pain of both knees 12/06/2015  . Globus pharyngeus 05/17/2015  . Genetic testing 12/23/2014  . Family history of breast cancer   . Family history of colon cancer   . Family history of  pancreatic cancer   . History of breast cancer 11/23/2014  . Allergy to ACE inhibitors 09/27/2014  . Prediabetes 06/23/2013  . RLS (restless legs syndrome) 01/15/2013  . Hx of Clostridium difficile infection 10/10/2012  . Dyspnea on exertion 04/02/2012  . NICM (nonischemic cardiomyopathy) ([place]) 03/07/2012  . Post-menopausal 01/11/2012  . Obstructive sleep apnea 04/04/2010  . V-tach ([place]) 01/03/2009  . ICD  [employer]  Single chamber 01/03/2009  . PFO (patent foramen ovale) 09/29/2008  . Vitamin D deficiency 08/17/2008  . Hypothyroidism 11/18/2006  . Hyperlipidemia 11/18/2006  . Depression with anxiety 11/18/2006  . Essential hypertension 11/18/2006  . Mitral valve prolapse 11/18/2006  . Cerebral aneurysm 11/18/2006  . Allergic rhinitis 11/18/2006  . GERD 11/18/2006  . Diverticulosis of colon 11/18/2006  . Fatty liver 11/18/2006  . Fibromyalgia 11/18/2006   Past Medical History:  Diagnosis Date  . AICD (automatic cardioverter/defibrillator) present   . Anxiety   . Arthritis   . Back pain   . 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 Bronchiectasis ([place])   . Cerebral aneurysm, nonruptured    had a clip put in  . CHF (congestive heart failure) ([place])   . Clostridium difficile infection   . Depressive disorder, not elsewhere classified   . Diverticulosis of colon (without mention of hemorrhage)   . Esophageal reflux   . Fatty liver   . Fibromyalgia   . Gastritis   . GERD (gastroesophageal reflux disease)   . GI bleed 2004  . Glaucoma   . Hiatal hernia   . Hyperlipidemia   . Hypertension   . Hypothyroidism   . Internal hemorrhoids   . Joint pain   . Obstructive sleep apnea (adult) (pediatric)   . Osteoarthritis   . Ostium secundum type atrial septal defect   . Other chronic nonalcoholic liver disease   . Other pulmonary embolism and infarction   . Palpitations   . Paroxysmal ventricular tachycardia ([place])   . Personal history of  radiation therapy   . Pneumonia    history  . Presence of permanent cardiac pacemaker   . PUD (peptic ulcer disease)   . Radiation 02/03/15-03/10/15   Right Breast  . Sarcoid    per pt , not sure  . Schatzki's ring   . Shortness of breath   . Sleep apnea   . Stroke [facility]) 2013   tia/ pt feels it was around 2008 0r 2009  . Takotsubo syndrome   . Tubular adenoma of colon   . Unspecified transient cerebral ischemia   . Unspecified vitamin D deficiency  Past Surgical History:  Procedure Laterality Date  . ABI  2006   normal  . BREAST EXCISIONAL BIOPSY    . BREAST LUMPECTOMY WITH RADIOACTIVE SEED LOCALIZATION Right 01/03/2015   Procedure: BREAST LUMPECTOMY WITH RADIOACTIVE SEED LOCALIZATION;  Surgeon: Autumn Messing III, MD;  Location: [facility];  Service: General;  Laterality: Right;  . CARDIAC DEFIBRILLATOR PLACEMENT  2006; 2012   BSX single chamber ICD  . carotid dopplers  2006   neg  . CEREBRAL ANEURYSM REPAIR  02/1999  . COLONOSCOPY    . hospitalization  2004   GI bleed, PUD, diverticulosis (EGD,colonscopy)  . hospitalization     PE, NSVT, s/p defib  . KNEE ARTHROSCOPY     bilateral  . LEFT HEART CATHETERIZATION WITH CORONARY ANGIOGRAM N/A 02/22/2012   Procedure: LEFT HEART CATHETERIZATION WITH CORONARY ANGIOGRAM;  Surgeon: Burnell Blanks, MD;  Location: [facility] CATH LAB;  Service: Cardiovascular;  Laterality: N/A;  . PARTIAL HYSTERECTOMY     Fibroids  . TONGUE BIOPSY  12/12/2017   due to sore tongue and white patches/abnormal cells  . TOTAL KNEE ARTHROPLASTY Left 02/13/2016   Procedure: TOTAL KNEE ARTHROPLASTY;  Surgeon: Vickey Huger, MD;  Location: [facility];  Service: Orthopedics;  Laterality: Left;  . UPPER GASTROINTESTINAL ENDOSCOPY     Social History   Tobacco Use  . Smoking status: Former Smoker    Packs/day: 1.00    Years: 15.00    Pack years: 15.00    Types: Cigarettes    Quit date: 08/20/1989    Years since quitting: 30.5  . Smokeless tobacco: Never Used  . Tobacco  comment: Started at 77; less than 1 PPD  Vaping Use  . Vaping Use: Never used  Substance Use Topics  . Alcohol use: Yes    Alcohol/week: 0.0 standard drinks    Comment: rare  . Drug use: No   Family History  Problem Relation Age of Onset  . Diabetes Mother   . Alzheimer's disease Mother   . Hypertension Mother   . Obesity Mother   . Other Brother        Thyroid problem 10/2016  . Allergies Sister   . Heart disease Sister   . Heart disease Brother   . Prostate cancer Brother 53       same brother as throat cancer  . Throat cancer Brother        dx in his 19s; also a smoker  . Heart disease Father   . Cancer Maternal Grandmother 21       colon cancer or abdominal cancer  . Lung cancer Maternal Grandfather 29  . Breast cancer Maternal Aunt 53  . Colon cancer Maternal Aunt 109       same sister as breast at 52  . Lung cancer Maternal Uncle   . Breast cancer Maternal Aunt        dx in her 17s  . Pancreatic cancer Cousin 78       maternal first cousin  . Breast cancer Cousin        paternal first cousin twice removed died in her 60s  . Heart disease Son        Cardiac Arrest 07/2016  . Stroke Neg Hx    Allergies  Allergen Reactions  . Ace Inhibitors Swelling    Angioedema; makes tongue "break out"   . Amitriptyline Hcl Other (See Comments)     makes her too sleepy!  . Atorvastatin Other (See Comments)  SEVERE MYALGIA  . Cymbalta [Duloxetine Hcl] Nausea Only and Other (See Comments)    Sleepiness/ sick  . Dilantin [Phenytoin Sodium Extended] Rash    Severe rash  . Paroxetine Nausea Only    Rapid heartbeat  . Ramipril Other (See Comments)    TONGUE ULCERS   . Rosuvastatin Other (See Comments)    SEVERE MYALGIA  . Carbamazepine Rash  . Codeine Itching  . Iodine-131 Other (See Comments)    Swelling at IV site only, no swelling or SOB around mouth.!  . Phenytoin Sodium Extended Rash  . Wellbutrin [Bupropion] Palpitations   Current Outpatient Medications on  File Prior to Visit  Medication Sig Dispense Refill  . albuterol (PROVENTIL) (2.5 MG/3ML) 0.083% nebulizer solution USE 1 VIAL IN NEBULIZER EVERY 6 HOURS - and as needed 75 mL 11  . albuterol (VENTOLIN HFA) 108 (90 Base) MCG/ACT inhaler Inhale 2 puffs into the lungs every 6 (six) hours as needed for wheezing or shortness of breath. 18 g 3  . Ascorbic Acid (VITAMIN C) 1000 MG tablet Take 1,000 mg by mouth daily.    [name] aspirin 81 MG tablet Take 81 mg by mouth daily.    . baclofen (LIORESAL) 10 MG tablet Take 10 mg by mouth at bedtime.    . calcium carbonate (OS-CAL - DOSED IN MG OF ELEMENTAL CALCIUM) 1250 (500 Ca) MG tablet Take 1 tablet by mouth.    . carvedilol (COREG) 12.5 MG tablet TAKE 1 TABLET TWICE DAILY 180 tablet 1  . cholecalciferol (VITAMIN D) 1000 UNITS tablet Take 5,000 Units by mouth daily.     . fexofenadine (ALLEGRA) 180 MG tablet Take 180 mg by mouth daily as needed for allergies or rhinitis.    . fluticasone (FLONASE) 50 MCG/ACT nasal spray Place 2 sprays into both nostrils daily as needed for allergies or rhinitis. 48 g 3  . furosemide (LASIX) 40 MG tablet Take 20 mg by mouth daily. 20-40mg     . hydroxypropyl methylcellulose / hypromellose (ISOPTO TEARS / GONIOVISC) 2.5 % ophthalmic solution Place 1 drop into both eyes daily.    [name] latanoprost (XALATAN) 0.005 % ophthalmic solution Place 1 drop into both eyes at bedtime.    [name] levothyroxine (SYNTHROID) 50 MCG tablet Take 1 tablet (50 mcg total) by mouth daily. 90 tablet 3  . magnesium oxide (MAG-OX) 400 MG tablet Take 1 tablet (400 mg total) by mouth 2 (two) times daily. 60 tablet 11  . Magnesium Oxide 400 (240 Mg) MG TABS Take 1 tablet (400 mg total) by mouth daily. 90 tablet 3  . metFORMIN (GLUCOPHAGE) 500 MG tablet Take 1 tablet (500 mg total) by mouth 2 (two) times daily. 60 tablet 0  . mometasone (ELOCON) 0.1 % lotion Apply 1 application topically daily as needed (rash).     . montelukast (SINGULAIR) 10 MG tablet TAKE 1 TABLET  AT BEDTIME 90 tablet 3  . Multiple Vitamins-Minerals (MULTIVITAMIN PO) Take 1 tablet by mouth daily.    . pantoprazole (PROTONIX) 40 MG tablet Take 1 tablet (40 mg total) by mouth daily. 90 tablet 1  . polyethylene glycol powder (GLYCOLAX/MIRALAX) powder Take 17 grams by mouth daily 1080 g 0  . potassium chloride (K-DUR) 10 MEQ tablet TAKE 1/2 TABLET (5MEQ TOTAL) EVERY DAY 45 tablet 3  . Respiratory Therapy Supplies (FLUTTER) DEVI 1 Device by Does not apply route as needed. 1 each 0  . simvastatin (ZOCOR) 40 MG tablet TAKE 1/2 TABLET EVERY DAY 45 tablet 3  .  sodium chloride HYPERTONIC 3 % nebulizer solution Take by nebulization 2 (two) times daily as needed for cough. 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 750 mL 12  . topiramate (TOPAMAX) 25 MG tablet Take 1 tablet (25 mg total) by mouth at bedtime. 30 tablet 0  . traMADol (ULTRAM) 50 MG tablet Take 1 tablet (50 mg total) by mouth 2 (two) times daily. 60 tablet 5  . zinc gluconate 50 MG tablet Take 50 mg by mouth daily.    [name] zolpidem (AMBIEN) 10 MG tablet TAKE 1 TABLET BY MOUTH AT BEDTIME AS NEEDED FOR SLEEP 90 tablet 1   No current facility-administered medications on file prior to visit.    Review of Systems  Constitutional: Negative for activity change, appetite change, fatigue, fever and unexpected weight change.  HENT: Negative for congestion, ear pain, rhinorrhea, sinus pressure and sore throat.   Eyes: Negative for pain, redness and visual disturbance.  Respiratory: Negative for cough, shortness of breath and wheezing.   Cardiovascular: Negative for chest pain and palpitations.  Gastrointestinal: Negative for abdominal pain, blood in stool, constipation and diarrhea.  Endocrine: Negative for polydipsia and polyuria.  Genitourinary: Positive for dysuria, frequency and urgency.  Musculoskeletal: Positive for arthralgias and back pain. Negative for myalgias.       Leg and foot cramps  Skin: Negative for pallor and rash.  Allergic/Immunologic: Negative  for environmental allergies.  Neurological: Negative for dizziness, syncope and headaches.  Hematological: Negative for adenopathy. Does not bruise/bleed easily.  Psychiatric/Behavioral: Negative for decreased concentration and dysphoric mood. The patient is not nervous/anxious.        Objective:   Physical Exam Constitutional:      General: She is not in acute distress.    Appearance: Normal appearance. She is well-developed. She is obese. She is not ill-appearing.  HENT:     Head: Normocephalic and atraumatic.  Eyes:     General: No scleral icterus.    Conjunctiva/sclera: Conjunctivae normal.     Pupils: Pupils are equal, round, and reactive to light.  Neck:     Thyroid: No thyromegaly.     Vascular: No carotid bruit or JVD.  Cardiovascular:     Rate and Rhythm: Normal rate and regular rhythm.     Heart sounds: Normal heart sounds. No gallop.   Pulmonary:     Effort: Pulmonary effort is normal. No respiratory distress.     Breath sounds: Normal breath sounds. No wheezing or rales.  Abdominal:     General: Bowel sounds are normal. There is no distension or abdominal bruit.     Palpations: Abdomen is soft. There is no mass.     Tenderness: There is no abdominal tenderness. There is no right CVA tenderness or left CVA tenderness.     Comments: Mild suprapubic tenderness   Musculoskeletal:        General: No tenderness.     Cervical back: Normal range of motion and neck supple.     Right lower leg: No edema.     Left lower leg: No edema.  Lymphadenopathy:     Cervical: No cervical adenopathy.  Skin:    General: Skin is warm and dry.     Coloration: Skin is not pale.     Findings: No erythema or rash.  Neurological:     Mental Status: She is alert.     Sensory: No sensory deficit.     Motor: No weakness.     Coordination: Coordination normal.     Gait: Gait  normal.     Deep Tendon Reflexes: Reflexes are normal and symmetric. Reflexes normal.  Psychiatric:        Mood  and Affect: Mood normal.           Assessment & Plan:   Problem List Items Addressed This Visit      Cardiovascular and Mediastinum   Essential hypertension    bp in fair control at this time  BP Readings from Last 1 Encounters:  02/16/20 128/68   No changes needed Most recent labs reviewed  Disc lifstyle change with low sodium diet and exercise  Does not take lasix every day      Relevant Orders   Basic metabolic panel     Endocrine   Hypothyroidism    Nl TSH in April Re check today in light of leg cramps Otherwise no changes      Relevant Orders   TSH     Other   Leg cramps - Primary    Pt does not take lasix daily  5 meq K daily  Takes mag as well   Lab today for bmp and mag and TSH Disc stretching  Mustard/tumeric may also help  Nl exam-good perfusion of feet      Relevant Orders   Basic metabolic panel   TSH   Magnesium   Dysuria    ua with trace leukocytes tx with keflex for 5 d  Fluids enc  Urine culture pending      Relevant Orders   Urinalysis Dipstick (Completed)   Urine Culture    Other Visit Diagnoses    Urinary urgency       Relevant Orders   Urine Culture

## 2020-02-16 NOTE — Assessment & Plan Note (Signed)
bp in fair control at this time  BP Readings from Last 1 Encounters:  02/16/20 128/68   No changes needed Most recent labs reviewed  Disc lifstyle change with low sodium diet and exercise  Does not take lasix every day

## 2020-02-17 LAB — URINE CULTURE
MICRO NUMBER:: 10647375
SPECIMEN QUALITY:: ADEQUATE

## 2020-03-01 ENCOUNTER — Other Ambulatory Visit: Payer: Self-pay

## 2020-03-01 ENCOUNTER — Ambulatory Visit
Admission: RE | Admit: 2020-03-01 | Discharge: 2020-03-01 | Disposition: A | Discharge status: Home / Self Care | Payer: Medicare Other | Source: Ambulatory Visit | Attending: Family Medicine | Admitting: Family Medicine

## 2020-03-01 ENCOUNTER — Encounter (INDEPENDENT_AMBULATORY_CARE_PROVIDER_SITE_OTHER): Payer: Self-pay | Admitting: Family Medicine

## 2020-03-01 ENCOUNTER — Ambulatory Visit (INDEPENDENT_AMBULATORY_CARE_PROVIDER_SITE_OTHER): Payer: Medicare Other | Admitting: Family Medicine

## 2020-03-01 VITALS — BP 120/52 | HR 55 | Temp 97.5°F | Ht 66.0 in | Wt 211.0 lb

## 2020-03-01 DIAGNOSIS — E2839 Other primary ovarian failure: Secondary | ICD-10-CM

## 2020-03-01 DIAGNOSIS — E669 Obesity, unspecified: Secondary | ICD-10-CM | POA: Diagnosis not present

## 2020-03-01 DIAGNOSIS — Z6834 Body mass index (BMI) 34.0-34.9, adult: Secondary | ICD-10-CM | POA: Diagnosis not present

## 2020-03-01 DIAGNOSIS — G478 Other sleep disorders: Secondary | ICD-10-CM

## 2020-03-01 DIAGNOSIS — G2581 Restless legs syndrome: Secondary | ICD-10-CM | POA: Diagnosis not present

## 2020-03-01 DIAGNOSIS — Z9889 Other specified postprocedural states: Secondary | ICD-10-CM

## 2020-03-02 ENCOUNTER — Ambulatory Visit (INDEPENDENT_AMBULATORY_CARE_PROVIDER_SITE_OTHER): Payer: Medicare Other | Admitting: *Deleted

## 2020-03-02 DIAGNOSIS — I472 Ventricular tachycardia, unspecified: Secondary | ICD-10-CM

## 2020-03-02 LAB — CUP PACEART REMOTE DEVICE CHECK
Battery Remaining Longevity: 42 mo
Battery Remaining Percentage: 44 %
Brady Statistic RV Percent Paced: 0 %
Date Time Interrogation Session: 20210714031500
HighPow Impedance: 75 Ohm
Implantable Lead Implant Date: 20050603
Implantable Lead Location: 753860
Implantable Lead Model: 185
Implantable Lead Serial Number: 116340
Implantable Pulse Generator Implant Date: 20110505
Lead Channel Impedance Value: 637 Ohm
Lead Channel Pacing Threshold Amplitude: 0.7 V
Lead Channel Pacing Threshold Pulse Width: 0.4 ms
Lead Channel Setting Pacing Amplitude: 2.4 V
Lead Channel Setting Pacing Pulse Width: 0.4 ms
Lead Channel Setting Sensing Sensitivity: 0.4 mV
Pulse Gen Serial Number: 266301

## 2020-03-02 NOTE — Progress Notes (Signed)
Chief Complaint:   OBESITY [name] is here to discuss her progress with her obesity treatment plan along with follow-up of her obesity related diagnoses. [name] is on the Category 2 Plan and states she is following her eating plan approximately 40% of the time. [name] states she is doing cardio and strength training for 60 minutes 3-4 times per week.  Today's visit was #: 8 Starting weight: 223 lbs Starting date: 10/01/2019 Today's weight: 211 lbs Today's date: 03/01/2020 Total lbs lost to date: 12 lbs Total lbs lost since last in-office visit: 1 lb  Interim History: [name] says she has been doing a lot of celebration eating.  She reports that she is not taking metformin or Topamax.  Subjective:   1. RLS (restless legs syndrome) [name] has restless legs syndrome that interferes with her sleep.  2. Nocturnal sleep-related eating disorder [name] has nighttime sleep-related eating disorder.  She takes Ambien at night to help with sleep.  Assessment/Plan:   1. RLS (restless legs syndrome) Will continue to monitor.  2. Nocturnal sleep-related eating disorder Consider changing Ambien to Restoril.  3. Class 1 obesity with serious comorbidity and body mass index (BMI) of 34.0 to 34.9 in adult, unspecified obesity type [name] is currently in the action stage of change. As such, her goal is to continue with weight loss efforts. She has agreed to the Category 2 Plan.   Exercise goals: For substantial health benefits, adults should do at least 150 minutes (2 hours and 30 minutes) a week of moderate-intensity, or 75 minutes (1 hour and 15 minutes) a week of vigorous-intensity aerobic physical activity, or an equivalent combination of moderate- and vigorous-intensity aerobic activity. Aerobic activity should be performed in episodes of at least 10 minutes, and preferably, it should be spread throughout the week.  Behavioral modification strategies: increasing lean protein intake and increasing  water intake.  [name] has agreed to follow-up with our clinic in 3 weeks. She was informed of the importance of frequent follow-up visits to maximize her success with intensive lifestyle modifications for her multiple health conditions.   Objective:   Blood pressure (!) 120/52, pulse (!) 55, temperature (!) 97.5 F (36.4 C), temperature source Oral, height 5\' 6"  (1.676 m), weight 211 lb (95.7 kg), SpO2 98 %. Body mass index is 34.06 kg/m.  General: Cooperative, alert, well developed, in no acute distress. HEENT: Conjunctivae and lids unremarkable. Cardiovascular: Regular rhythm.  Lungs: Normal work of breathing. Neurologic: No focal deficits.   Lab Results  Component Value Date   CREATININE 0.93 02/16/2020   BUN 21 02/16/2020   NA 138 02/16/2020   K 4.2 02/16/2020   CL 104 02/16/2020   CO2 28 02/16/2020   Lab Results  Component Value Date   ALT 41 (H) 12/07/2019   AST 37 12/07/2019   ALKPHOS 102 12/07/2019   BILITOT 0.5 12/07/2019   Lab Results  Component Value Date   HGBA1C 5.3 12/07/2019   HGBA1C 5.7 (H) 10/01/2019   HGBA1C 5.4 12/04/2018   HGBA1C 5.6 11/27/2017   HGBA1C 5.6 04/12/2017   Lab Results  Component Value Date   INSULIN 15.6 10/01/2019   Lab Results  Component Value Date   TSH 1.86 02/16/2020   Lab Results  Component Value Date   CHOL 147 12/07/2019   HDL 46.60 12/07/2019   LDLCALC 77 12/07/2019   LDLDIRECT 141.8 07/04/2007   TRIG 117.0 12/07/2019   CHOLHDL 3 12/07/2019   Lab Results  Component Value Date  WBC 3.5 (L) 12/07/2019   HGB 12.8 12/07/2019   HCT 39.0 12/07/2019   MCV 91.4 12/07/2019   PLT 178.0 12/07/2019   Lab Results  Component Value Date   IRON 89 10/01/2019   TIBC 335 10/01/2019   FERRITIN 178 (H) 10/01/2019   Attestation Statements:   Reviewed by clinician on day of visit: allergies, medications, problem list, medical history, surgical history, family history, social history, and previous encounter notes.  Time  spent on visit including pre-visit chart review and post-visit care and charting was 25 minutes.   I, Water quality scientist, CMA, am acting as Location manager for [employer], DO.  I have reviewed the above documentation for accuracy and completeness, and I agree with the above. Briscoe Deutscher, DO

## 2020-03-03 NOTE — Progress Notes (Signed)
Remote ICD transmission.   

## 2020-03-03 NOTE — Progress Notes (Unsigned)
Office: [phone]  /  Fax: [phone]    Date: March 17, 2020   Appointment Start Time: *** Duration: *** minutes Provider: Glennie Isle, Psy.D. Type of Session: Intake for Individual Therapy  Location of Patient: {gbptloc:23249} Location of Provider: {Location of Service:22491} Type of Contact: Telepsychological Visit via MyChart Video Visit  Informed Consent: Prior to proceeding with today's appointment, two pieces of identifying information were obtained. In addition, [name]'s physical location at the time of this appointment was obtained as well a phone number she could be reached at in the event of technical difficulties. [name] and this provider participated in today's telepsychological service.   The provider's role was explained to [employer]. The provider reviewed and discussed issues of confidentiality, privacy, and limits therein (e.g., reporting obligations). In addition to verbal informed consent, written informed consent for psychological services was obtained prior to the initial appointment. Since the clinic is not a 24/7 crisis center, mental health emergency resources were shared and this  provider explained MyChart, e-mail, voicemail, and/or other messaging systems should be utilized only for non-emergency reasons. This provider also explained that information obtained during appointments will be placed in [name]'s medical record and relevant information will be shared with other providers at [facility] for coordination of care. Moreover, [name] agreed information may be shared with other [facility] providers as needed for coordination of care. By signing the service agreement document, [name] provided written consent for coordination of care. Prior to initiating telepsychological services, [name] completed an informed consent document, which included the development of a safety plan (i.e., an emergency contact, nearest emergency room,  and emergency resources) in the event of an emergency/crisis. [name] expressed understanding of the rationale of the safety plan. [name] verbally acknowledged understanding she is ultimately responsible for understanding her insurance benefits for telepsychological and in-person services. This provider also reviewed confidentiality, as it relates to telepsychological services, as well as the rationale for telepsychological services (i.e., to reduce exposure risk to COVID-19). Yamira  acknowledged understanding that appointments cannot be recorded without both party consent and she is aware she is responsible for securing confidentiality on her end of the session. [name] verbally consented to proceed.  Chief Complaint/HPI: [name] was referred by Dr. Briscoe Deutscher on March 01, 2020. Per the note for the visit on March 01, 2020, "[name] says she has been doing a lot of celebration eating.  She reports that she is not taking metformin or Topamax." The note for the initial appointment with Dr. Briscoe Deutscher on October 01, 2019 indicated the following: "[name]'s habits were reviewed today and are as follows: her desired weight loss is 42 pounds, she started gaining weight between the ages of [age], her heaviest weight ever was 230 pounds, she craves steak, fish, and pasta, she snacks frequently in the evenings, she wakes up frequently in the middle of the night to eat, she skips breakfast and/or lunch frequently, she is frequently drinking liquids with calories, she frequently makes poor food choices, she has problems with excessive hunger, she frequently eats larger portions than normal and she struggles with emotional eating." [name]'s Food and Mood (modified PHQ-9) score on October 01, 2019 was 8.  During today's appointment, [name] was verbally administered a questionnaire assessing various behaviors related to emotional eating. Dayami endorsed the following: {gbmoodandfood:21755}. She shared she craves ***. [name] believes  the onset of emotional eating was *** and described the current frequency of emotional eating as ***. In addition, [name] {gblegal:22371}  a history of binge eating. *** Moreover, [name] indicated *** triggers emotional eating, whereas *** makes emotional eating better. Furthermore, [name] {gblegal:22371} other problems of concern. ***   Mental Status Examination:  Appearance: {Appearance:22431} Behavior: {Behavior:22445} Mood: {gbmood:21757} Affect: {Affect:22436} Speech: {Speech:22432} Eye Contact: {Eye Contact:22433} Psychomotor Activity: {Motor Activity:22434} Gait: {gbgait:23404} Thought Process: {thought process:22448}  Thought Content/Perception: {disturbances:22451} Orientation: {Orientation:22437} Memory/Concentration: {gbcognition:22449} Insight/Judgment: {Insight:22446}  Family & Psychosocial History: [name] reported she is *** and ***. She indicated she is currently ***. Additionally, [name] shared her highest level of education obtained is ***. Currently, [name]'s social support system consists of her ***. Moreover, [name] stated she resides with her ***.   Medical History: ***  Mental Health History: [name] reported ***. [name] {Endorse or deny of item:23407} hospitalizations for psychiatric concerns, and she has never met with a psychiatrist.*** [name] stated she was *** psychotropic medications. [name] {gblegal:22371} a family history of mental health related concerns. *** [name] {Endorse or deny of item:23407} trauma including {gbtrauma:22071} abuse, as well as neglect. ***  [name] described her typical mood lately as ***. Aside from concerns noted above and endorsed on the PHQ-9 and GAD-7, [name] reported ***. [name] {gblegal:22371} current alcohol use. *** She {gblegal:22371} tobacco use. *** She {VOHYWVP:71062} illicit/recreational substance use. Regarding caffeine intake, Vallory reported ***. Furthermore, Evita indicated she is not experiencing the following: {gbsxs:21965}. She also denied  history of and current suicidal ideation, plan, and intent; history of and current homicidal ideation, plan, and intent; and history of and current engagement in self-harm.  The following strengths were reported by [name] Catalina: ***. The following strengths were observed by this provider: ability to express thoughts and feelings during the therapeutic session, ability to establish and benefit from a therapeutic relationship, willingness to work toward established goal(s) with the clinic and ability to engage in reciprocal conversation. ***  Legal History: Rosio {Endorse or deny of item:23407} legal involvement.   Structured Assessments Results: The Patient Health Questionnaire-9 (PHQ-9) is a self-report measure that assesses symptoms and severity of depression over the course of the last two weeks. Miraya obtained a score of *** suggesting {GBPHQ9SEVERITY:21752}. Bellah finds the endorsed symptoms to be {gbphq9difficulty:21754}. [0= Not at all; 1= Several days; 2= More than half the days; 3= Nearly every day] Little interest or pleasure in doing things ***  Feeling down, depressed, or hopeless ***  Trouble falling or staying asleep, or sleeping too much ***  Feeling tired or having little energy ***  Poor appetite or overeating ***  Feeling bad about yourself --- or that you are a failure or have let yourself or your family down ***  Trouble concentrating on things, such as reading the newspaper or watching television ***  Moving or speaking so slowly that other people could have noticed? Or the opposite --- being so fidgety or restless that you have been moving around a lot more than usual ***  Thoughts that you would be better off dead or hurting yourself in some way ***  PHQ-9 Score ***    The Generalized Anxiety Disorder-7 (GAD-7) is a brief self-report measure that assesses symptoms of anxiety over the course of the last two weeks. Eulamae obtained a score of *** suggesting {gbgad7severity:21753}. Prestina  finds the endorsed symptoms to be {gbphq9difficulty:21754}. [0= Not at all; 1= Several days; 2= Over half the days; 3= Nearly every day] Feeling nervous, anxious, on edge ***  Not being able to stop or control worrying ***  Worrying too much about different things ***  Trouble relaxing ***  Being so restless that it's hard to sit still ***  Becoming easily annoyed or irritable ***  Feeling afraid as if something awful might happen ***  GAD-7 Score ***   Interventions:  {Interventions List for Intake:23406}  Provisional DSM-5 Diagnosis(es): {Diagnoses:22752}  Plan: Ivry appears able and willing to participate as evidenced by collaboration on a treatment goal, engagement in reciprocal conversation, and asking questions as needed for clarification. The next appointment will be scheduled in {gbweeks:21758}, which will be {gbtxmodality:23402}. The following treatment goal was established: {gbtxgoals:21759}. This provider will regularly review the treatment plan and medical chart to keep informed of status changes. Catheleen expressed understanding and agreement with the initial treatment plan of care. *** Nate will be sent a handout via e-mail to utilize between now and the next appointment to increase awareness of hunger patterns and subsequent eating. Debanhi provided verbal consent during today's appointment for this provider to send the handout via e-mail. ***

## 2020-03-17 ENCOUNTER — Telehealth (INDEPENDENT_AMBULATORY_CARE_PROVIDER_SITE_OTHER): Payer: Medicare Other | Admitting: Psychology

## 2020-03-23 ENCOUNTER — Other Ambulatory Visit: Payer: Self-pay

## 2020-03-23 ENCOUNTER — Ambulatory Visit (INDEPENDENT_AMBULATORY_CARE_PROVIDER_SITE_OTHER): Payer: Medicare Other | Admitting: Family Medicine

## 2020-03-23 ENCOUNTER — Encounter (INDEPENDENT_AMBULATORY_CARE_PROVIDER_SITE_OTHER): Payer: Self-pay | Admitting: Family Medicine

## 2020-03-23 VITALS — BP 121/73 | HR 56 | Temp 97.7°F | Ht 66.0 in | Wt 213.0 lb

## 2020-03-23 DIAGNOSIS — G2581 Restless legs syndrome: Secondary | ICD-10-CM

## 2020-03-23 DIAGNOSIS — E669 Obesity, unspecified: Secondary | ICD-10-CM

## 2020-03-23 DIAGNOSIS — G4709 Other insomnia: Secondary | ICD-10-CM

## 2020-03-23 DIAGNOSIS — F4321 Adjustment disorder with depressed mood: Secondary | ICD-10-CM

## 2020-03-23 DIAGNOSIS — Z6834 Body mass index (BMI) 34.0-34.9, adult: Secondary | ICD-10-CM

## 2020-03-24 NOTE — Progress Notes (Signed)
Chief Complaint:   OBESITY [name] is here to discuss her progress with her obesity treatment plan along with follow-up of her obesity related diagnoses. [name] is on the Category 2 Plan and states she is following her eating plan approximately 0% of the time. [name] states she is exercising for 0 minutes 0 times per week.  Today's visit was #: 9 Starting weight: 223 lbs Starting date: 10/01/2019 Today's weight: 213 lbs Today's date: 03/23/2020 Total lbs lost to date: 10 lbs Total lbs lost since last in-office visit: 0 Total weight loss percentage to date: -4.48%  Interim History: [name] is eating Freshly for lunch, dinner (around 600 calories), and snacks.  She says she did not like Topiramate as it made her too sleepy/foggy.  She just got back from the beach and she will be going to a funeral this week.   Subjective:   1. RLS (restless legs syndrome) Armonie suffers from RLS, and is not taking any medications for it.  2. Other insomnia [name] has difficulty sleeping.  She tried taking Trazodone, but did not like it.  3. Grief Her nephew's funeral is this week.  She will be traveling.  Assessment/Plan:   1. RLS (restless legs syndrome) We reviewed medication and alternative options. Larosa will try tonic water to help with RLS. This issue directly impacts care plan for optimization of BMI and metabolic health as it impacts the patient's ability to make lifestyle changes.  2. Other insomnia Not optimized.  Will continue to monitor with changes discussed above.  3. Grief Will continue to monitor.   4. Class 1 obesity with serious comorbidity and body mass index (BMI) of 34.0 to 34.9 in adult, unspecified obesity type [name] is currently in the action stage of change. As such, her goal is to continue with weight loss efforts. She has agreed to keeping a food journal and adhering to recommended goals of 1200-1400 calories and 95 grams of protein.   Exercise goals: For substantial  health benefits, adults should do at least 150 minutes (2 hours and 30 minutes) a week of moderate-intensity, or 75 minutes (1 hour and 15 minutes) a week of vigorous-intensity aerobic physical activity, or an equivalent combination of moderate- and vigorous-intensity aerobic activity. Aerobic activity should be performed in episodes of at least 10 minutes, and preferably, it should be spread throughout the week.  Behavioral modification strategies: increasing lean protein intake, increasing water intake and increasing high fiber foods.  [name] has agreed to follow-up with our clinic in 3-4 weeks. She was informed of the importance of frequent follow-up visits to maximize her success with intensive lifestyle modifications for her multiple health conditions.   Objective:   Blood pressure 121/73, pulse (!) 56, temperature 97.7 F (36.5 C), temperature source Oral, height 5\' 6"  (1.676 m), weight 213 lb (96.6 kg), SpO2 98 %. Body mass index is 34.38 kg/m.  General: Cooperative, alert, well developed, in no acute distress. HEENT: Conjunctivae and lids unremarkable. Cardiovascular: Regular rhythm.  Lungs: Normal work of breathing. Neurologic: No focal deficits.   Lab Results  Component Value Date   CREATININE 0.93 02/16/2020   BUN 21 02/16/2020   NA 138 02/16/2020   K 4.2 02/16/2020   CL 104 02/16/2020   CO2 28 02/16/2020   Lab Results  Component Value Date   ALT 41 (H) 12/07/2019   AST 37 12/07/2019   ALKPHOS 102 12/07/2019   BILITOT 0.5 12/07/2019   Lab Results  Component Value Date  HGBA1C 5.3 12/07/2019   HGBA1C 5.7 (H) 10/01/2019   HGBA1C 5.4 12/04/2018   HGBA1C 5.6 11/27/2017   HGBA1C 5.6 04/12/2017   Lab Results  Component Value Date   INSULIN 15.6 10/01/2019   Lab Results  Component Value Date   TSH 1.86 02/16/2020   Lab Results  Component Value Date   CHOL 147 12/07/2019   HDL 46.60 12/07/2019   LDLCALC 77 12/07/2019   LDLDIRECT 141.8 07/04/2007   TRIG  117.0 12/07/2019   CHOLHDL 3 12/07/2019   Lab Results  Component Value Date   WBC 3.5 (L) 12/07/2019   HGB 12.8 12/07/2019   HCT 39.0 12/07/2019   MCV 91.4 12/07/2019   PLT 178.0 12/07/2019   Lab Results  Component Value Date   IRON 89 10/01/2019   TIBC 335 10/01/2019   FERRITIN 178 (H) 10/01/2019   Attestation Statements:   Reviewed by clinician on day of visit: allergies, medications, problem list, medical history, surgical history, family history, social history, and previous encounter notes.  Time spent on visit including pre-visit chart review and post-visit care and charting was 25 minutes.   I, Water quality scientist, CMA, am acting as transcriptionist for Briscoe Deutscher, DO  I have reviewed the above documentation for accuracy and completeness, and I agree with the above. Briscoe Deutscher, DO

## 2020-03-26 ENCOUNTER — Other Ambulatory Visit (INDEPENDENT_AMBULATORY_CARE_PROVIDER_SITE_OTHER): Payer: Self-pay | Admitting: Family Medicine

## 2020-03-26 DIAGNOSIS — R7303 Prediabetes: Secondary | ICD-10-CM

## 2020-03-28 ENCOUNTER — Encounter
Payer: Medicare Other | Attending: Physical Medicine and Rehabilitation | Admitting: Physical Medicine and Rehabilitation

## 2020-03-28 ENCOUNTER — Other Ambulatory Visit: Payer: Self-pay

## 2020-03-28 ENCOUNTER — Encounter: Payer: Self-pay | Admitting: Neurology

## 2020-03-28 ENCOUNTER — Ambulatory Visit (INDEPENDENT_AMBULATORY_CARE_PROVIDER_SITE_OTHER): Payer: Medicare Other | Admitting: Neurology

## 2020-03-28 ENCOUNTER — Encounter: Payer: Self-pay | Admitting: Physical Medicine and Rehabilitation

## 2020-03-28 VITALS — BP 131/70 | HR 64 | Ht 66.0 in | Wt 217.0 lb

## 2020-03-28 VITALS — BP 111/70 | HR 70 | Temp 98.7°F | Ht 66.0 in | Wt 216.0 lb

## 2020-03-28 DIAGNOSIS — M797 Fibromyalgia: Secondary | ICD-10-CM | POA: Insufficient documentation

## 2020-03-28 DIAGNOSIS — M170 Bilateral primary osteoarthritis of knee: Secondary | ICD-10-CM | POA: Insufficient documentation

## 2020-03-28 DIAGNOSIS — M25561 Pain in right knee: Secondary | ICD-10-CM | POA: Diagnosis present

## 2020-03-28 DIAGNOSIS — M7918 Myalgia, other site: Secondary | ICD-10-CM | POA: Insufficient documentation

## 2020-03-28 DIAGNOSIS — M19042 Primary osteoarthritis, left hand: Secondary | ICD-10-CM

## 2020-03-28 DIAGNOSIS — M25851 Other specified joint disorders, right hip: Secondary | ICD-10-CM | POA: Diagnosis not present

## 2020-03-28 DIAGNOSIS — Z5181 Encounter for therapeutic drug level monitoring: Secondary | ICD-10-CM | POA: Diagnosis present

## 2020-03-28 DIAGNOSIS — G8929 Other chronic pain: Secondary | ICD-10-CM | POA: Diagnosis present

## 2020-03-28 DIAGNOSIS — Z79891 Long term (current) use of opiate analgesic: Secondary | ICD-10-CM | POA: Diagnosis present

## 2020-03-28 DIAGNOSIS — M25562 Pain in left knee: Secondary | ICD-10-CM | POA: Diagnosis present

## 2020-03-28 DIAGNOSIS — M542 Cervicalgia: Secondary | ICD-10-CM | POA: Diagnosis not present

## 2020-03-28 DIAGNOSIS — M19041 Primary osteoarthritis, right hand: Secondary | ICD-10-CM | POA: Diagnosis not present

## 2020-03-28 NOTE — Patient Instructions (Signed)
2. Patient here for trigger point injections for  Consent done and on chart.  Cleaned areas with alcohol and injected using a 27 gauge 1.5 inch needle  Injected 3cc Using 1% Lidocaine with no EPI  Upper traps B/L Levators Posterior scalenes B/L Middle scalenes Splenius Capitus Pectoralis Major R only Rhomboids B/L Infraspinatus Teres Major/minor Thoracic paraspinals Lumbar paraspinals B/L x2 Other injections-    Patient's level of pain prior was 5/10 Current level of pain after injections is- 3/10  There was no bleeding or complications.  Patient was advised to drink a lot of water on day after injections to flush system Will have increased soreness for 12-48 hours after injections.  Can use Lidocaine patches the day AFTER injections Can use theracane on day of injections in places didn't inject Can use heating pad 4-6 hours AFTER injections  3. Doesn't need refills on Tramadol today.   4. F/U in 3 months

## 2020-03-28 NOTE — Progress Notes (Signed)
   Patient is a 69 yr old R handed female with hx of Bronchiesctasis, CHF, prediabetes- with A1c of 5.3- - not on blood thinners, has a defibrillator, GERD, and fibromyalgia issues- dx'd 10 years ago. [place]refor trigger point injections R trochanteric bursa injection. .       Was driving all day yesterday- back from [facility] yesterday- drove the whole way.    Would like trigger point injections again. Last got 5/21.    Usually takes tramadol 1x/day- occ 2x/day.   Been exercising- feels some better.      Plan:   1. steroid injection was performed at R trochanteric bursa steroid injection using 1% plain Lidocaine and 40mg  /1cc of Kenalog. This was well tolerated.  Cleaned with betadine x3 and allowed to dry- then alcohol then injected using 27 gauge 1.5 inch needle- no bleeding or complications.    F/U in 3 months for R trochanteric bursa  steroid injections Lidocaine will kick in 15 minutes- and wear off tonight- the steroid will kick in tomorrow within 24 hours and take up to 72 hours to fully kick in.   2. Patient here for trigger point injections for  Consent done and on chart.  Cleaned areas with alcohol and injected using a 27 gauge 1.5 inch needle  Injected 3cc Using 1% Lidocaine with no EPI  Upper traps B/L Levators Posterior scalenes B/L Middle scalenes Splenius Capitus Pectoralis Major R only Rhomboids B/L Infraspinatus Teres Major/minor Thoracic paraspinals Lumbar paraspinals B/L x2 Other injections-    Patient's level of pain prior was 5/10 Current level of pain after injections is- 3/10  There was no bleeding or complications.  Patient was advised to drink a lot of water on day after injections to flush system Will have increased soreness for 12-48 hours after injections.  Can use Lidocaine patches the day AFTER injections Can use theracane on day of injections in places didn't inject Can use heating pad 4-6 hours AFTER injections  3. Doesn't need  refills on Tramadol today.   4. F/U in 3 months   I spent a total of 25 minutes on visit- as detailed above.

## 2020-03-28 NOTE — Patient Instructions (Signed)
I think you are doing well  Continue seeing your specialists  Return as needed

## 2020-03-28 NOTE — Progress Notes (Signed)
PATIENT: Georgene Kopper DOB: 1951-01-13  REASON FOR VISIT: follow up HISTORY FROM: patient  HISTORY OF PRESENT ILLNESS: Today 03/28/20  Ms. Hollon 69 year old female history of fibromyalgia, chronic neck pain, and paresthesias in the left hand. She is followed by Dr. Estanislado Pandy for fibromyalgia. For her neck pain CT scan of cervical spine suggested spondylosis that could lead to left C5 nerve root impingement.  EMG and nerve conduction studies done previously did not show evidence of radiculopathy.  Now going to [facility] center [facility], receiving trigger point injections to her right hip, back, and neck.  Overall, doing well.  Injections are working well for pain.  She is not receiving any medications from this office.  She just drove back from [place] yesterday following a funeral.  No longer complains of paresthesia down the left arm.  Mentions leg cramps at night, is taking tramadol, baclofen, and Ambien.  [facility]  Presents today for evaluation unaccompanied  HISTORY  10/05/2019 SS: Ms. Zappia is a 69 year old female with history of fibromyalgia, chronic neck pain, and paresthesias in her left hand.  She received a cervical steroid injection in November 2020. She is followed by Dr. Estanislado Pandy for fibromyalgia. She is given Tramadol for neck pain related to fibromyalgia. For her neck pain CT scan of cervical spine suggested spondylosis that could lead to left C5 nerve root impingement.  EMG and nerve conduction studies done previously did not show evidence of radiculopathy.  She says she is overall doing great.  The upper injection was very helpful for her neck pain, she no longer has paresthesias down the left arm.  She completed physical therapy, that has taught her exercises for her left shoulder, neck, and back pain. This is helpful.  She is no longer taking gabapentin.  She is considering referral for pain management clinic.   Overall, she is doing well, is pleased.   REVIEW OF SYSTEMS: Out of a complete 14 system review of symptoms, the patient complains only of the following symptoms, and all other reviewed systems are negative.  N/A  ALLERGIES: Allergies  Allergen Reactions  . Ace Inhibitors Swelling    Angioedema; makes tongue "break out"   . Amitriptyline Hcl Other (See Comments)     makes her too sleepy!  . Atorvastatin Other (See Comments)    SEVERE MYALGIA  . Cymbalta [Duloxetine Hcl] Nausea Only and Other (See Comments)    Sleepiness/ sick  . Dilantin [Phenytoin Sodium Extended] Rash    Severe rash  . Paroxetine Nausea Only    Rapid heartbeat  . Ramipril Other (See Comments)    TONGUE ULCERS   . Rosuvastatin Other (See Comments)    SEVERE MYALGIA  . Carbamazepine Rash  . Codeine Itching  . Iodine-131 Other (See Comments)    Swelling at IV site only, no swelling or SOB around mouth.!  . Phenytoin Sodium Extended Rash  . Wellbutrin [Bupropion] Palpitations    HOME MEDICATIONS: Outpatient Medications Prior to Visit  Medication Sig Dispense Refill  . albuterol (PROVENTIL) (2.5 MG/3ML) 0.083% nebulizer solution USE 1 VIAL IN NEBULIZER EVERY 6 HOURS - and as needed 75 mL 11  . albuterol (VENTOLIN HFA) 108 (90 Base) MCG/ACT inhaler Inhale 2 puffs into the lungs every 6 (six) hours as needed for wheezing or shortness of breath. 18 g 3  . Ascorbic Acid (VITAMIN C) 1000 MG tablet Take 1,000 mg by mouth daily.    [name]  aspirin 81 MG tablet Take 81 mg by mouth daily.    . baclofen (LIORESAL) 10 MG tablet Take 10 mg by mouth at bedtime.    . calcium carbonate (OS-CAL - DOSED IN MG OF ELEMENTAL CALCIUM) 1250 (500 Ca) MG tablet Take 1 tablet by mouth.    . carvedilol (COREG) 12.5 MG tablet TAKE 1 TABLET TWICE DAILY 180 tablet 1  . cephALEXin (KEFLEX) 250 MG capsule Take 1 capsule (250 mg total) by mouth 2 (two) times daily. 10 capsule 0  . cholecalciferol (VITAMIN D) 1000 UNITS tablet Take 5,000 Units  by mouth daily.     . fexofenadine (ALLEGRA) 180 MG tablet Take 180 mg by mouth daily as needed for allergies or rhinitis.    . fluticasone (FLONASE) 50 MCG/ACT nasal spray Place 2 sprays into both nostrils daily as needed for allergies or rhinitis. 48 g 3  . furosemide (LASIX) 40 MG tablet Take 20 mg by mouth daily. 20-40mg     . hydroxypropyl methylcellulose / hypromellose (ISOPTO TEARS / GONIOVISC) 2.5 % ophthalmic solution Place 1 drop into both eyes daily.    [name] latanoprost (XALATAN) 0.005 % ophthalmic solution Place 1 drop into both eyes at bedtime.    [name] levothyroxine (SYNTHROID) 50 MCG tablet Take 1 tablet (50 mcg total) by mouth daily. 90 tablet 3  . magnesium oxide (MAG-OX) 400 MG tablet Take 1 tablet (400 mg total) by mouth 2 (two) times daily. 60 tablet 11  . Magnesium Oxide 400 (240 Mg) MG TABS Take 1 tablet (400 mg total) by mouth daily. 90 tablet 3  . mometasone (ELOCON) 0.1 % lotion Apply 1 application topically daily as needed (rash).     . montelukast (SINGULAIR) 10 MG tablet TAKE 1 TABLET AT BEDTIME 90 tablet 3  . Multiple Vitamins-Minerals (MULTIVITAMIN PO) Take 1 tablet by mouth daily.    . pantoprazole (PROTONIX) 40 MG tablet Take 1 tablet (40 mg total) by mouth daily. 90 tablet 1  . polyethylene glycol powder (GLYCOLAX/MIRALAX) powder Take 17 grams by mouth daily 1080 g 0  . potassium chloride (K-DUR) 10 MEQ tablet TAKE 1/2 TABLET (5MEQ TOTAL) EVERY DAY 45 tablet 3  . Respiratory Therapy Supplies (FLUTTER) DEVI 1 Device by Does not apply route as needed. 1 each 0  . simvastatin (ZOCOR) 40 MG tablet TAKE 1/2 TABLET EVERY DAY 45 tablet 3  . sodium chloride HYPERTONIC 3 % nebulizer solution Take by nebulization 2 (two) times daily as needed for cough. 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 750 mL 12  . traMADol (ULTRAM) 50 MG tablet Take 1 tablet (50 mg total) by mouth 2 (two) times daily. 60 tablet 5  . zinc gluconate 50 MG tablet Take 50 mg by mouth daily.    [name] zolpidem (AMBIEN) 10 MG tablet  TAKE 1 TABLET BY MOUTH AT BEDTIME AS NEEDED FOR SLEEP 90 tablet 1   No facility-administered medications prior to visit.    PAST MEDICAL HISTORY: Past Medical History:  Diagnosis Date  . AICD (automatic cardioverter/defibrillator) present   . Anxiety   . Arthritis   . Back pain   . 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 Bronchiectasis ([place])   . Cerebral aneurysm, nonruptured    had a clip put in  . CHF (congestive heart failure) ([place])   . Clostridium difficile infection   . Depressive disorder, not elsewhere classified   . Diverticulosis of colon (without mention of hemorrhage)   . Esophageal reflux   .  Fatty liver   . Fibromyalgia   . Gastritis   . GERD (gastroesophageal reflux disease)   . GI bleed 2004  . Glaucoma   . Hiatal hernia   . Hyperlipidemia   . Hypertension   . Hypothyroidism   . Internal hemorrhoids   . Joint pain   . Obstructive sleep apnea (adult) (pediatric)   . Osteoarthritis   . Ostium secundum type atrial septal defect   . Other chronic nonalcoholic liver disease   . Other pulmonary embolism and infarction   . Palpitations   . Paroxysmal ventricular tachycardia ([place])   . Personal history of radiation therapy   . Pneumonia    history  . Presence of permanent cardiac pacemaker   . PUD (peptic ulcer disease)   . Radiation 02/03/15-03/10/15   Right Breast  . Sarcoid    per pt , not sure  . Schatzki's ring   . Shortness of breath   . Sleep apnea   . Stroke [facility]) 2013   tia/ pt feels it was around 2008 0r 2009  . Takotsubo syndrome   . Tubular adenoma of colon   . Unspecified transient cerebral ischemia   . Unspecified vitamin D deficiency     PAST SURGICAL HISTORY: Past Surgical History:  Procedure Laterality Date  . ABI  2006   normal  . BREAST EXCISIONAL BIOPSY    . BREAST LUMPECTOMY WITH RADIOACTIVE SEED LOCALIZATION Right 01/03/2015   Procedure: BREAST LUMPECTOMY WITH RADIOACTIVE SEED LOCALIZATION;   Surgeon: Autumn Messing III, MD;  Location: [facility];  Service: General;  Laterality: Right;  . CARDIAC DEFIBRILLATOR PLACEMENT  2006; 2012   BSX single chamber ICD  . carotid dopplers  2006   neg  . CEREBRAL ANEURYSM REPAIR  02/1999  . COLONOSCOPY    . hospitalization  2004   GI bleed, PUD, diverticulosis (EGD,colonscopy)  . hospitalization     PE, NSVT, s/p defib  . KNEE ARTHROSCOPY     bilateral  . LEFT HEART CATHETERIZATION WITH CORONARY ANGIOGRAM N/A 02/22/2012   Procedure: LEFT HEART CATHETERIZATION WITH CORONARY ANGIOGRAM;  Surgeon: Burnell Blanks, MD;  Location: [facility] CATH LAB;  Service: Cardiovascular;  Laterality: N/A;  . PARTIAL HYSTERECTOMY     Fibroids  . TONGUE BIOPSY  12/12/2017   due to sore tongue and white patches/abnormal cells  . TOTAL KNEE ARTHROPLASTY Left 02/13/2016   Procedure: TOTAL KNEE ARTHROPLASTY;  Surgeon: Vickey Huger, MD;  Location: [facility];  Service: Orthopedics;  Laterality: Left;  . UPPER GASTROINTESTINAL ENDOSCOPY      FAMILY HISTORY: Family History  Problem Relation Age of Onset  . Diabetes Mother   . Alzheimer's disease Mother   . Hypertension Mother   . Obesity Mother   . Other Brother        Thyroid problem 10/2016  . Allergies Sister   . Heart disease Sister   . Heart disease Brother   . Prostate cancer Brother 37       same brother as throat cancer  . Throat cancer Brother        dx in his 56s; also a smoker  . Heart disease Father   . Cancer Maternal Grandmother 100       colon cancer or abdominal cancer  . Lung cancer Maternal Grandfather 12  . Breast cancer Maternal Aunt 44  . Colon cancer Maternal Aunt 76       same sister as breast at 80  . Lung cancer Maternal Uncle   .  Breast cancer Maternal Aunt        dx in her 60s  . Pancreatic cancer Cousin 59       maternal first cousin  . Breast cancer Cousin        paternal first cousin twice removed died in her 84s  . Heart disease Son        Cardiac Arrest 07/2016  . Stroke Neg Hx       SOCIAL HISTORY: Social History   Socioeconomic History  . Marital status: Divorced    Spouse name: Not on file  . Number of children: 2  . Years of education: 37  . Highest education level: Not on file  Occupational History  . Occupation: DISABLED  Tobacco Use  . Smoking status: Former Smoker    Packs/day: 1.00    Years: 15.00    Pack years: 15.00    Types: Cigarettes    Quit date: 08/20/1989    Years since quitting: 30.6  . Smokeless tobacco: Never Used  . Tobacco comment: Started at 53; less than 1 PPD  Vaping Use  . Vaping Use: Never used  Substance and Sexual Activity  . Alcohol use: Yes    Alcohol/week: 0.0 standard drinks    Comment: rare  . Drug use: No  . Sexual activity: Not Currently  Other Topics Concern  . Not on file  Social History Narrative   Lives alone   Caffeine AYT:KZSW   Retired from [employer]   2 children   Divorced   Right handed    Social Determinants of Health   Financial Resource Strain: Low Risk   . Difficulty of Paying Living Expenses: Not hard at all  Food Insecurity: No Food Insecurity  . Worried About Charity fundraiser in the Last Year: Never true  . Ran Out of Food in the Last Year: Never true  Transportation Needs: No Transportation Needs  . Lack of Transportation (Medical): No  . Lack of Transportation (Non-Medical): No  Physical Activity: Inactive  . Days of Exercise per Week: 0 days  . Minutes of Exercise per Session: 0 min  Stress: Stress Concern Present  . Feeling of Stress : To some extent  Social Connections:   . Frequency of Communication with Friends and Family:   . Frequency of Social Gatherings with Friends and Family:   . Attends Religious Services:   . Active Member of Clubs or Organizations:   . Attends Archivist Meetings:   [name] Marital Status:   Intimate Partner Violence: Not At Risk  . Fear of Current or Ex-Partner: No  . Emotionally Abused: No  . Physically Abused: No  . Sexually Abused:  No   PHYSICAL EXAM  Vitals:   03/28/20 1445  BP: 131/70  Pulse: 64  Weight: 217 lb (98.4 kg)  Height: 5\' 6"  (1.676 m)   Body mass index is 35.02 kg/m.  Generalized: Well developed, in no acute distress   Neurological examination  Mentation: Alert oriented to time, place, history taking. Follows all commands speech and language fluent Cranial nerve II-XII: Pupils were equal round reactive to light. Extraocular movements were full, visual field were full on confrontational test. Facial sensation and strength were normal. Head turning and shoulder shrug  were normal and symmetric. Motor: The motor testing reveals 5 over 5 strength of all 4 extremities. Good symmetric motor tone is noted throughout.  Sensory: Sensory testing is intact to soft touch on all 4 extremities. No evidence of  extinction is noted.  Coordination: Cerebellar testing reveals good finger-nose-finger and heel-to-shin bilaterally.  Gait and station: Gait is normal. Tandem gait is slightly unsteady. Romberg is negative. No drift is seen.  Reflexes: Deep tendon reflexes are symmetric and normal bilaterally.   DIAGNOSTIC DATA (LABS, IMAGING, TESTING) - I reviewed patient records, labs, notes, testing and imaging myself where available.  Lab Results  Component Value Date   WBC 3.5 (L) 12/07/2019   HGB 12.8 12/07/2019   HCT 39.0 12/07/2019   MCV 91.4 12/07/2019   PLT 178.0 12/07/2019      Component Value Date/Time   NA 138 02/16/2020 1026   NA 140 10/01/2019 1408   NA 141 12/01/2014 0847   K 4.2 02/16/2020 1026   K 3.8 12/01/2014 0847   CL 104 02/16/2020 1026   CO2 28 02/16/2020 1026   CO2 26 12/01/2014 0847   GLUCOSE 104 (H) 02/16/2020 1026   GLUCOSE 175 (H) 12/01/2014 0847   BUN 21 02/16/2020 1026   BUN 10 10/01/2019 1408   BUN 10.6 12/01/2014 0847   CREATININE 0.93 02/16/2020 1026   CREATININE 0.82 02/27/2017 1518   CREATININE 0.8 12/01/2014 0847   CALCIUM 10.0 02/16/2020 1026   CALCIUM 9.8  12/01/2014 0847   PROT 6.8 12/07/2019 1053   PROT 7.1 10/01/2019 1408   PROT 7.5 12/01/2014 0847   ALBUMIN 4.2 12/07/2019 1053   ALBUMIN 4.1 10/01/2019 1408   ALBUMIN 3.9 12/01/2014 0847   AST 37 12/07/2019 1053   AST 29 12/01/2014 0847   ALT 41 (H) 12/07/2019 1053   ALT 33 12/01/2014 0847   ALKPHOS 102 12/07/2019 1053   ALKPHOS 127 12/01/2014 0847   BILITOT 0.5 12/07/2019 1053   BILITOT 0.3 10/01/2019 1408   BILITOT 0.42 12/01/2014 0847   GFRNONAA 71 10/01/2019 1408   GFRNONAA 74 02/27/2017 1518   GFRAA 82 10/01/2019 1408   GFRAA 85 02/27/2017 1518   Lab Results  Component Value Date   CHOL 147 12/07/2019   HDL 46.60 12/07/2019   LDLCALC 77 12/07/2019   LDLDIRECT 141.8 07/04/2007   TRIG 117.0 12/07/2019   CHOLHDL 3 12/07/2019   Lab Results  Component Value Date   HGBA1C 5.3 12/07/2019   Lab Results  Component Value Date   VITAMINB12 483 10/01/2019   Lab Results  Component Value Date   TSH 1.86 02/16/2020      ASSESSMENT AND PLAN 69 y.o. year old female  has a past medical history of AICD (automatic cardioverter/defibrillator) present, Anxiety, Arthritis, Back pain, Breast cancer ([place]) (2016), Bronchiectasis ([place]), Cerebral aneurysm, nonruptured, CHF (congestive heart failure) ([place]), Clostridium difficile infection, Depressive disorder, not elsewhere classified, Diverticulosis of colon (without mention of hemorrhage), Esophageal reflux, Fatty liver, Fibromyalgia, Gastritis, GERD (gastroesophageal reflux disease), GI bleed (2004), Glaucoma, Hiatal hernia, Hyperlipidemia, Hypertension, Hypothyroidism, Internal hemorrhoids, Joint pain, Obstructive sleep apnea (adult) (pediatric), Osteoarthritis, Ostium secundum type atrial septal defect, Other chronic nonalcoholic liver disease, Other pulmonary embolism and infarction, Palpitations, Paroxysmal ventricular tachycardia ([place]), Personal history of radiation therapy, Pneumonia, Presence of permanent cardiac pacemaker, PUD  (peptic ulcer disease), Radiation (02/03/15-03/10/15), Sarcoid, Schatzki's ring, Shortness of breath, Sleep apnea, Stroke ([place]) (2013), Takotsubo syndrome, Tubular adenoma of colon, Unspecified transient cerebral ischemia, and Unspecified vitamin D deficiency. here with:  1.  Fibromyalgia 2.  Neck pain 3.  Paresthesia  -Symptoms remain under good control -Is now seeing pain center, receiving trigger point injections, no longer complains of paresthesia down the left arm -She is not receiving any medications  from this office -She will continue follow-up with her rheumatologist, primary care, and pain specialist, follow-up at this office on an as-needed basis  I spent 20 minutes of face-to-face and non-face-to-face time with patient.  This included previsit chart review, lab review, study review, order entry, electronic health record documentation, patient education.  Butler Denmark, AGNP-C, DNP 03/28/2020, 3:16 PM [facility] [address], [place], [place] [address] [phone]

## 2020-03-29 NOTE — Progress Notes (Signed)
I have read the note, and I agree with the clinical assessment and plan.  Kennedey Digilio K Lanny Donoso   

## 2020-04-04 ENCOUNTER — Encounter: Payer: Self-pay | Admitting: Family Medicine

## 2020-04-05 ENCOUNTER — Telehealth: Payer: Self-pay | Admitting: Interventional Cardiology

## 2020-04-05 ENCOUNTER — Ambulatory Visit (INDEPENDENT_AMBULATORY_CARE_PROVIDER_SITE_OTHER): Payer: Medicare Other | Admitting: Physician Assistant

## 2020-04-05 ENCOUNTER — Ambulatory Visit: Payer: Medicare Other | Admitting: Neurology

## 2020-04-05 ENCOUNTER — Other Ambulatory Visit: Payer: Self-pay

## 2020-04-05 ENCOUNTER — Encounter: Payer: Self-pay | Admitting: Physician Assistant

## 2020-04-05 ENCOUNTER — Encounter: Payer: Self-pay | Admitting: *Deleted

## 2020-04-05 VITALS — BP 110/80 | HR 60 | Ht 66.0 in | Wt 217.0 lb

## 2020-04-05 DIAGNOSIS — I428 Other cardiomyopathies: Secondary | ICD-10-CM | POA: Diagnosis not present

## 2020-04-05 DIAGNOSIS — I472 Ventricular tachycardia, unspecified: Secondary | ICD-10-CM

## 2020-04-05 DIAGNOSIS — R079 Chest pain, unspecified: Secondary | ICD-10-CM | POA: Diagnosis not present

## 2020-04-05 DIAGNOSIS — I10 Essential (primary) hypertension: Secondary | ICD-10-CM | POA: Diagnosis not present

## 2020-04-05 MED ORDER — NITROGLYCERIN 0.4 MG SL SUBL: 0.4000 mg | SUBLINGUAL_TABLET | SUBLINGUAL | 3 refills | Status: DC | PRN

## 2020-04-05 NOTE — Progress Notes (Signed)
Cardiology Office Note    Date:  04/05/2020   ID:  Adriana, [name] [birth year], MRN [id number]  PCP:  Abner Greenspan, MD  Cardiologist:  Dr. Irish Lack Electrophysiologist: Dr. Caryl Comes   Chief Complaint: Chest discomfort  History of Present Illness:   [name] is a 69 y.o. female chronic systolic heart failure secondary to nonischemic cardiomyopathy with normalization of LV function, s/p ICD for ventricular tachycardia, hypertension, morbid obesity, OSA, bronchiectasis (followed by Dr. Halford Chessman),  fibromyalgia (followed by neurology) and stroke added to my schedule for chest discomfort.  No CAD by cath in approximately 2006.  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  In 2013, ejection fraction was listed at 35 to 40%.  Most recent echocardiogram in 2018 that showed that her LVEF has normalized.   Hx of palpitation.  Zio patch 12/2019 showed PVCs with associated symptoms of flutters.  Last seen by Dr. Irish Lack May 2021.  Patient reports intermittent sharp/pressure chest pain for past 6 weeks or so.  Pain is located substernally and occasionally on the right side.  She takes Protonix for acid reflux.  Did not noted associated palpitation.  Her symptom is different than prior tachycardia and typical GERD.  Each episode last for less than 5 minutes. Occasional radiation to her back.  Denies syncope, dizziness, orthopnea, PND, lower extremity edema or melena.  Some fatigue and tiredness.  Past Medical History:  Diagnosis Date  . AICD (automatic cardioverter/defibrillator) present   . Anxiety   . Arthritis   . Back pain   . Breast cancer ([place]) 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  . Bronchiectasis ([place])   . Cerebral aneurysm, nonruptured    had a clip put in  . CHF (congestive heart failure) ([place])   . Clostridium difficile infection   . Depressive disorder, not elsewhere classified   . Diverticulosis of colon (without mention of  hemorrhage)   . Esophageal reflux   . Fatty liver   . Fibromyalgia   . Gastritis   . GERD (gastroesophageal reflux disease)   . GI bleed 2004  . Glaucoma   . Hiatal hernia   . Hyperlipidemia   . Hypertension   . Hypothyroidism   . Internal hemorrhoids   . Joint pain   . Obstructive sleep apnea (adult) (pediatric)   . Osteoarthritis   . Ostium secundum type atrial septal defect   . Other chronic nonalcoholic liver disease   . Other pulmonary embolism and infarction   . Palpitations   . Paroxysmal ventricular tachycardia ([place])   . Personal history of radiation therapy   . Pneumonia    history  . Presence of permanent cardiac pacemaker   . PUD (peptic ulcer disease)   . Radiation 02/03/15-03/10/15   Right Breast  . Sarcoid    per pt , not sure  . Schatzki's ring   . Shortness of breath   . Sleep apnea   . Stroke [facility]) 2013   tia/ pt feels it was around 2008 0r 2009  . Takotsubo syndrome   . Tubular adenoma of colon   . Unspecified transient cerebral ischemia   . Unspecified vitamin D deficiency     Past Surgical History:  Procedure Laterality Date  . ABI  2006   normal  . BREAST EXCISIONAL BIOPSY    . BREAST LUMPECTOMY WITH RADIOACTIVE SEED LOCALIZATION Right 01/03/2015   Procedure: BREAST LUMPECTOMY WITH RADIOACTIVE SEED LOCALIZATION;  Surgeon: Autumn Messing III,  MD;  Location: [facility];  Service: General;  Laterality: Right;  . CARDIAC DEFIBRILLATOR PLACEMENT  2006; 2012   BSX single chamber ICD  . carotid dopplers  2006   neg  . CEREBRAL ANEURYSM REPAIR  02/1999  . COLONOSCOPY    . hospitalization  2004   GI bleed, PUD, diverticulosis (EGD,colonscopy)  . hospitalization     PE, NSVT, s/p defib  . KNEE ARTHROSCOPY     bilateral  . LEFT HEART CATHETERIZATION WITH CORONARY ANGIOGRAM N/A 02/22/2012   Procedure: LEFT HEART CATHETERIZATION WITH CORONARY ANGIOGRAM;  Surgeon: Burnell Blanks, MD;  Location: [facility] CATH LAB;  Service: Cardiovascular;  Laterality: N/A;  .  PARTIAL HYSTERECTOMY     Fibroids  . TONGUE BIOPSY  12/12/2017   due to sore tongue and white patches/abnormal cells  . TOTAL KNEE ARTHROPLASTY Left 02/13/2016   Procedure: TOTAL KNEE ARTHROPLASTY;  Surgeon: Vickey Huger, MD;  Location: [facility];  Service: Orthopedics;  Laterality: Left;  . UPPER GASTROINTESTINAL ENDOSCOPY      Current Medications: Prior to Admission medications   Medication Sig Start Date End Date Taking? Authorizing Provider  albuterol (PROVENTIL) (2.5 MG/3ML) 0.083% nebulizer solution USE 1 VIAL IN NEBULIZER EVERY 6 HOURS - and as needed 08/05/19   Chesley Mires, MD  albuterol (VENTOLIN HFA) 108 (90 Base) MCG/ACT inhaler Inhale 2 puffs into the lungs every 6 (six) hours as needed for wheezing or shortness of breath. 04/17/19   Chesley Mires, MD  Ascorbic Acid (VITAMIN C) 1000 MG tablet Take 1,000 mg by mouth daily.    [provider]  aspirin 81 MG tablet Take 81 mg by mouth daily.    [provider]  baclofen (LIORESAL) 10 MG tablet Take 10 mg by mouth at bedtime.    [provider]  calcium carbonate (OS-CAL - DOSED IN MG OF ELEMENTAL CALCIUM) 1250 (500 Ca) MG tablet Take 1 tablet by mouth.    [provider]  carvedilol (COREG) 12.5 MG tablet TAKE 1 TABLET TWICE DAILY 08/26/19   Deboraha Sprang, MD  cephALEXin (KEFLEX) 250 MG capsule Take 1 capsule (250 mg total) by mouth 2 (two) times daily. 02/16/20   Tower, [name], MD  cholecalciferol (VITAMIN D) 1000 UNITS tablet Take 5,000 Units by mouth daily.     [provider]  fexofenadine (ALLEGRA) 180 MG tablet Take 180 mg by mouth daily as needed for allergies or rhinitis.    [provider]  fluticasone (FLONASE) 50 MCG/ACT nasal spray Place 2 sprays into both nostrils daily as needed for allergies or rhinitis. 12/10/19   Tower, [name], MD  furosemide (LASIX) 40 MG tablet Take 20 mg by mouth daily. 20-40mg     [provider]  hydroxypropyl methylcellulose / hypromellose  (ISOPTO TEARS / GONIOVISC) 2.5 % ophthalmic solution Place 1 drop into both eyes daily.    [provider]  latanoprost (XALATAN) 0.005 % ophthalmic solution Place 1 drop into both eyes at bedtime.    [provider]  levothyroxine (SYNTHROID) 50 MCG tablet Take 1 tablet (50 mcg total) by mouth daily. 12/10/19   Tower, [name], MD  magnesium oxide (MAG-OX) 400 MG tablet Take 1 tablet (400 mg total) by mouth 2 (two) times daily. 12/21/19   Lovorn, Jinny Blossom, MD  Magnesium Oxide 400 (240 Mg) MG TABS Take 1 tablet (400 mg total) by mouth daily. 03/17/19   Deboraha Sprang, MD  mometasone (ELOCON) 0.1 % lotion Apply 1 application topically daily  as needed (rash).  02/19/17   [provider]  montelukast (SINGULAIR) 10 MG tablet TAKE 1 TABLET AT BEDTIME 04/29/19   Chesley Mires, MD  Multiple Vitamins-Minerals (MULTIVITAMIN PO) Take 1 tablet by mouth daily.    [provider]  pantoprazole (PROTONIX) 40 MG tablet Take 1 tablet (40 mg total) by mouth daily. 07/13/19   Pyrtle, Lajuan Lines, MD  polyethylene glycol powder (GLYCOLAX/MIRALAX) powder Take 17 grams by mouth daily 02/27/16   Pyrtle, Lajuan Lines, MD  potassium chloride (K-DUR) 10 MEQ tablet TAKE 1/2 TABLET (5MEQ TOTAL) EVERY DAY 03/17/19   Deboraha Sprang, MD  Respiratory Therapy Supplies (FLUTTER) DEVI 1 Device by Does not apply route as needed. 09/22/18   Fenton Foy, NP  simvastatin (ZOCOR) 40 MG tablet TAKE 1/2 TABLET EVERY DAY 12/16/19   Deboraha Sprang, MD  sodium chloride HYPERTONIC 3 % nebulizer solution Take by nebulization 2 (two) times daily as needed for cough. 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 08/05/19   Chesley Mires, MD  traMADol (ULTRAM) 50 MG tablet Take 1 tablet (50 mg total) by mouth 2 (two) times daily. 01/04/20   Lovorn, Jinny Blossom, MD  zinc gluconate 50 MG tablet Take 50 mg by mouth daily.    [provider]  zolpidem (AMBIEN) 10 MG tablet TAKE 1 TABLET BY MOUTH AT BEDTIME AS NEEDED FOR SLEEP 11/19/19   Tower, [name], MD     Allergies:   Ace inhibitors, Amitriptyline hcl, Atorvastatin, Cymbalta [duloxetine hcl], Dilantin [phenytoin sodium extended], Paroxetine, Ramipril, Rosuvastatin, Carbamazepine, Codeine, Iodine-131, Phenytoin sodium extended, and Wellbutrin [bupropion]   Social History   Socioeconomic History  . Marital status: Divorced    Spouse name: Not on file  . Number of children: 2  . Years of education: 67  . Highest education level: Not on file  Occupational History  . Occupation: DISABLED  Tobacco Use  . Smoking status: Former Smoker    Packs/day: 1.00    Years: 15.00    Pack years: 15.00    Types: Cigarettes    Quit date: 08/20/1989    Years since quitting: 30.6  . Smokeless tobacco: Never Used  . Tobacco comment: Started at 25; less than 1 PPD  Vaping Use  . Vaping Use: Never used  Substance and Sexual Activity  . Alcohol use: Yes    Alcohol/week: 0.0 standard drinks    Comment: rare  . Drug use: No  . Sexual activity: Not Currently  Other Topics Concern  . Not on file  Social History Narrative   Lives alone   Caffeine WNU:UVOZ   Retired from [employer]   2 children   Divorced   Right handed    Social Determinants of Health   Financial Resource Strain: Low Risk   . Difficulty of Paying Living Expenses: Not hard at all  Food Insecurity: No Food Insecurity  . Worried About Charity fundraiser in the Last Year: Never true  . Ran Out of Food in the Last Year: Never true  Transportation Needs: No Transportation Needs  . Lack of Transportation (Medical): No  . Lack of Transportation (Non-Medical): No  Physical Activity: Inactive  . Days of Exercise per Week: 0 days  . Minutes of Exercise per Session: 0 min  Stress: Stress Concern Present  . Feeling of Stress : To some extent  Social Connections:   . Frequency of Communication with Friends and Family:   . Frequency of Social Gatherings with Friends and Family:   .  Attends Religious Services:   . Active Member of  Clubs or Organizations:   . Attends Archivist Meetings:   [name] Marital Status:      Family History:  The patient's family history includes Allergies in her sister; Alzheimer's disease in her mother; Breast cancer in her cousin and maternal aunt; Breast cancer (age of onset: 52) in her maternal aunt; Cancer (age of onset: 44) in her maternal grandmother; Colon cancer (age of onset: 24) in her maternal aunt; Diabetes in her mother; Heart disease in her brother, father, sister, and son; Hypertension in her mother; Lung cancer in her maternal uncle; Lung cancer (age of onset: 68) in her maternal grandfather; Obesity in her mother; Other in her brother; Pancreatic cancer (age of onset: 87) in her cousin; Prostate cancer (age of onset: 26) in her brother; Throat cancer in her brother.  ROS:   Please see the history of present illness.    ROS All other systems reviewed and are negative.   PHYSICAL EXAM:   VS:  BP 110/80   Pulse 60   Ht 5\' 6"  (1.676 m)   Wt 217 lb (98.4 kg)   LMP  (LMP Unknown)   SpO2 98%   BMI 35.02 kg/m    GEN: Well nourished, well developed, in no acute distress  HEENT: normal  Neck: no JVD, carotid bruits, or masses Cardiac: RRR; no murmurs, rubs, or gallops,no edema  Respiratory:  clear to auscultation bilaterally, normal work of breathing GI: soft, nontender, nondistended, + BS MS: no deformity or atrophy  Skin: warm and dry, no rash Neuro:  Alert and Oriented x 3, Strength and sensation are intact Psych: euthymic mood, full affect  Wt Readings from Last 3 Encounters:  04/05/20 217 lb (98.4 kg)  03/28/20 217 lb (98.4 kg)  03/28/20 216 lb (98 kg)      Studies/Labs Reviewed:   EKG:  EKG is ordered today.  The ekg ordered today demonstrates normal sinus rhythm at rate of 60 bpm  Recent Labs: 12/07/2019: ALT 41; Hemoglobin 12.8; Platelets 178.0 02/16/2020: BUN 21; Creatinine, Ser 0.93; Magnesium 2.3; Potassium 4.2; Sodium 138; TSH 1.86   Lipid Panel     Component Value Date/Time   CHOL 147 12/07/2019 1053   CHOL 171 10/01/2019 1408   TRIG 117.0 12/07/2019 1053   HDL 46.60 12/07/2019 1053   HDL 47 10/01/2019 1408   CHOLHDL 3 12/07/2019 1053   VLDL 23.4 12/07/2019 1053   LDLCALC 77 12/07/2019 1053   LDLCALC 97 10/01/2019 1408   LDLDIRECT 141.8 07/04/2007 0936    Additional studies/ records that were reviewed today include:    7 days monitor 12/16/2019 Findings Symptoms of flutters assoc mostly with PVCs  Symptoms of flutters also assoc with sinus S VT Nonsustained 6 episodes; fastest 141 bpm for 6 beats; longest for 7 beats at 100 bpm    No serious arrhythmia Symptoms assoc with infrequent PVCs    Echocardiogram: 04/2017 Study Conclusions   - Left ventricle: The cavity size was normal. Wall thickness was  increased in a pattern of mild LVH. Systolic function was normal.  The estimated ejection fraction was in the range of 55% to 60%.  Wall motion was normal; there were no regional wall motion  abnormalities. Doppler parameters are consistent with abnormal  left ventricular relaxation (grade 1 diastolic dysfunction).  - Mitral valve: Mildly thickened leaflets . Valve area by pressure  half-time: 2.32 cm^2.  - Left atrium: Volume/bsa, ES (1-plane Simpson&'s, A4C):  33.2  ml/m^2.  - Right ventricle: Pacer wire or catheter noted in right ventricle.  - Right atrium: The atrium was mildly dilated.  - Tricuspid valve: There was mild regurgitation.   Impressions:   - Compared to the prior study, there has been no significant  interval change.    ASSESSMENT & PLAN:    1. Chest pain Her chest discomfort is reproducible with palpation today however reports sometimes this is different at home.  She does not think her symptoms is similar to her prior tachycardia or GERD.  Patient prefers evaluation later than monitoring of symptoms.  Will do stress test.  As needed sublingual nitroglycerin use.  I have advised  her to monitor her symptoms very closely and take vital sign when having symptoms.  If worsening, advise to call EMS.  Patient is agree with plan.  2. NICM - normalization on LVEF by echo in 2018  3. Hx of VT s/p ICD - Followed by Dr. Caryl Comes  4. HTN  5. OSA - CPAP compliance recommended.   Medication Adjustments/Labs and Tests Ordered: Current medicines are reviewed at length with the patient today.  Concerns regarding medicines are outlined above.  Medication changes, Labs and Tests ordered today are listed in the Patient Instructions below. Patient Instructions  Medication Instructions:  .instu  *If you need a refill on your cardiac medications before your next appointment, please call your pharmacy*   Lab Work: None ordered  If you have labs (blood work) drawn today and your tests are completely normal, you will receive your results only by: [name] MyChart Message (if you have MyChart) OR . A paper copy in the mail If you have any lab test that is abnormal or we need to change your treatment, we will call you to review the results.   Testing/Procedures: None ordered   Follow-Up: At [facility], you and your health needs are our priority.  As part of our continuing mission to provide you with exceptional heart care, we have created designated Provider Care Teams.  These Care Teams include your primary Cardiologist (physician) and Advanced Practice Providers (APPs -  Physician Assistants and Nurse Practitioners) who all work together to provide you with the care you need, when you need it.  We recommend signing up for the patient portal called "MyChart".  Sign up information is provided on this After Visit Summary.  MyChart is used to connect with patients for Virtual Visits (Telemedicine).  Patients are able to view lab/test results, encounter notes, upcoming appointments, etc.  Non-urgent messages can be sent to your provider as well.   To learn more about what you can do with  MyChart, go to [email or web address].    Your next appointment:   6 month(s)  The format for your next appointment:   In Person  Provider:   You may see Larae Grooms, MD or one of the following Advanced Practice Providers on your designated Care Team:    Melina Copa, PA-C  Ermalinda Barrios, PA-C    Other Instructions      Signed, Leanor Kail, [place]  04/05/2020 11:51 AM    [address], [place], [place]  [address] Phone: [phone]; Fax: [phone]

## 2020-04-05 NOTE — Telephone Encounter (Signed)
Pt sent message complaining of chest pains and wanted to see Dr. Irish Lack ASAP. Was willing to see PA. Scheduled to see Vin Bhagat this morning, 04/05/20 at 11:15 am

## 2020-04-05 NOTE — Addendum Note (Signed)
Addended by: Gaetano Net on: 04/05/2020 11:58 AM   Modules accepted: Orders

## 2020-04-05 NOTE — Telephone Encounter (Signed)
Spoke with pt and she denies CP currently.  Had intermittent episodes yesterday.  Denies radiation to neck or jaw.  Did have some discomfort in left shoulder but this is normal for her due to shoulder issues.  Denies SOB, lightheadedness, dizziness, or diaphoresis with episodes.  Did have HA and hot flashes when CP occurred.  No issues today so far.  Advised pt to keep appt in our office at 11:15A.  Pt appreciative for call.

## 2020-04-05 NOTE — Patient Instructions (Addendum)
Medication Instructions:  Your physician has recommended you make the following change in your medication:  1.  START Nitroglycerin 0.4 take as needed      *If you need a refill on your cardiac medications before your next appointment, please call your pharmacy*   Lab Work: None ordered  If you have labs (blood work) drawn today and your tests are completely normal, you will receive your results only by: [name] MyChart Message (if you have MyChart) OR . A paper copy in the mail If you have any lab test that is abnormal or we need to change your treatment, we will call you to review the results.   Testing/Procedures: Your physician has requested that you have a lexiscan myoview. For further information please visit [email or web address]. Please follow instruction sheet, BELOW:    How to prepare for your Myocardial Perfusion Test: . Do not eat or drink 3 hours prior to your test, except you may have water. . Do not consume products containing caffeine (regular or decaffeinated) 12 hours prior to your test. (ex: coffee, chocolate, sodas, tea). . Do bring a list of your current medications with you.  If not listed below, you may take your medications as normal. . Do wear comfortable clothes (no dresses or overalls) and walking shoes, tennis shoes preferred (No heels or open toe shoes are allowed). . Do NOT wear cologne, perfume, aftershave, or lotions (deodorant is allowed). . If these instructions are not followed, your test will have to be rescheduled.  Please report to [address], [address] for your test.  If you have questions or concerns about your appointment, you can call the Nuclear Lab at [phone].  If you cannot keep your appointment, please provide 24 hours notification to the Nuclear Lab, to avoid a possible $50 charge to your account.   Follow-Up: At [facility], you and your health needs are our priority.  As part of our continuing mission to provide you with  exceptional heart care, we have created designated Provider Care Teams.  These Care Teams include your primary Cardiologist (physician) and Advanced Practice Providers (APPs -  Physician Assistants and Nurse Practitioners) who all work together to provide you with the care you need, when you need it.  We recommend signing up for the patient portal called "MyChart".  Sign up information is provided on this After Visit Summary.  MyChart is used to connect with patients for Virtual Visits (Telemedicine).  Patients are able to view lab/test results, encounter notes, upcoming appointments, etc.  Non-urgent messages can be sent to your provider as well.   To learn more about what you can do with MyChart, go to [email or web address].    Your next appointment:   4 WEEKS The format for your next appointment:   In Person  Provider:   You may see Larae Grooms, MD or one of the following Advanced Practice Providers on your designated Care Team:    Melina Copa, PA-C  Ermalinda Barrios, PA-C    Other Instructions  Regadenoson injection What is this medicine? REGADENOSON is used to test the heart for coronary artery disease. It is used in patients who can not exercise for their stress test. This medicine may be used for other purposes; ask your health care provider or pharmacist if you have questions. COMMON BRAND NAME(S): Lexiscan What should I tell my health care provider before I take this medicine? They need to know if you have any of these conditions:  heart problems  lung or breathing disease, like asthma or COPD  an unusual or allergic reaction to regadenoson, other medicines, foods, dyes, or preservatives  pregnant or trying to get pregnant  breast-feeding How should I use this medicine? This medicine is for injection into a vein. It is given by a health care professional in a hospital or clinic setting. Talk to your pediatrician regarding the use of this medicine in children.  Special care may be needed. Overdosage: If you think you have taken too much of this medicine contact a poison control center or emergency room at once. NOTE: This medicine is only for you. Do not share this medicine with others. What if I miss a dose? This does not apply. What may interact with this medicine?  caffeine  dipyridamole  guarana  theophylline This list may not describe all possible interactions. Give your health care provider a list of all the medicines, herbs, non-prescription drugs, or dietary supplements you use. Also tell them if you smoke, drink alcohol, or use illegal drugs. Some items may interact with your medicine. What should I watch for while using this medicine? Your condition will be monitored carefully while you are receiving this medicine. Do not take medicines, foods, or drinks with caffeine (like coffee, tea, or colas) for at least 12 hours before your test. If you do not know if something contains caffeine, ask your health care professional. What side effects may I notice from receiving this medicine? Side effects that you should report to your doctor or health care professional as soon as possible:  allergic reactions like skin rash, itching or hives, swelling of the face, lips, or tongue  breathing problems  chest pain, tightness or palpitations  severe headache Side effects that usually do not require medical attention (report to your doctor or health care professional if they continue or are bothersome):  flushing  headache  irritation or pain at site where injected  nausea, vomiting This list may not describe all possible side effects. Call your doctor for medical advice about side effects. You may report side effects to FDA at 1-800-FDA-1088. Where should I keep my medicine? This drug is given in a hospital or clinic and will not be stored at home. NOTE: This sheet is a summary. It may not cover all possible information. If you have  questions about this medicine, talk to your doctor, pharmacist, or health care provider.  2020 Elsevier/Gold Standard (2008-04-05 15:08:13)

## 2020-04-11 ENCOUNTER — Telehealth (HOSPITAL_COMMUNITY): Payer: Self-pay | Admitting: *Deleted

## 2020-04-11 NOTE — Telephone Encounter (Signed)
Left message on voicemail per DPR in reference to upcoming appointment scheduled on 04/13/20 with detailed instructions given per Myocardial Perfusion Study Information Sheet for the test. LM to arrive 15 minutes early, and that it is imperative to arrive on time for appointment to keep from having the test rescheduled. If you need to cancel or reschedule your appointment, please call the office within 24 hours of your appointment. Failure to do so may result in a cancellation of your appointment, and a $50 no show fee. Phone number given for call back for any questions. Kirstie Peri

## 2020-04-13 ENCOUNTER — Ambulatory Visit (HOSPITAL_COMMUNITY): Payer: Medicare Other | Attending: Cardiology

## 2020-04-13 ENCOUNTER — Other Ambulatory Visit: Payer: Self-pay

## 2020-04-13 DIAGNOSIS — R079 Chest pain, unspecified: Secondary | ICD-10-CM | POA: Diagnosis not present

## 2020-04-13 LAB — MYOCARDIAL PERFUSION IMAGING
LV dias vol: 97 mL (ref 46–106)
LV sys vol: 48 mL
Peak HR: 88 {beats}/min
Rest HR: 63 {beats}/min
SDS: 5
SRS: 0
SSS: 5
TID: 1.01

## 2020-04-13 MED ORDER — TECHNETIUM TC 99M TETROFOSMIN IV KIT
10.3000 | PACK | Freq: Once | INTRAVENOUS | Status: AC | PRN
  Administered 2020-04-13: 10.3 via INTRAVENOUS
  Filled 2020-04-13: qty 11

## 2020-04-13 MED ORDER — REGADENOSON 0.4 MG/5ML IV SOLN
0.4000 mg | Freq: Once | INTRAVENOUS | Status: AC
Start: 2020-04-13 — End: 2020-04-13
  Administered 2020-04-13: 0.4 mg via INTRAVENOUS

## 2020-04-13 MED ORDER — TECHNETIUM TC 99M TETROFOSMIN IV KIT
31.7000 | PACK | Freq: Once | INTRAVENOUS | Status: AC | PRN
  Administered 2020-04-13: 31.7 via INTRAVENOUS
  Filled 2020-04-13: qty 32

## 2020-04-20 ENCOUNTER — Other Ambulatory Visit (INDEPENDENT_AMBULATORY_CARE_PROVIDER_SITE_OTHER): Payer: Self-pay | Admitting: Family Medicine

## 2020-04-20 ENCOUNTER — Ambulatory Visit (INDEPENDENT_AMBULATORY_CARE_PROVIDER_SITE_OTHER): Payer: Medicare Other | Admitting: Family Medicine

## 2020-04-20 ENCOUNTER — Encounter (INDEPENDENT_AMBULATORY_CARE_PROVIDER_SITE_OTHER): Payer: Self-pay | Admitting: Family Medicine

## 2020-04-20 ENCOUNTER — Other Ambulatory Visit: Payer: Self-pay

## 2020-04-20 VITALS — BP 111/70 | HR 68 | Temp 98.1°F | Ht 66.0 in | Wt 212.0 lb

## 2020-04-20 DIAGNOSIS — Z6834 Body mass index (BMI) 34.0-34.9, adult: Secondary | ICD-10-CM

## 2020-04-20 DIAGNOSIS — G478 Other sleep disorders: Secondary | ICD-10-CM | POA: Diagnosis not present

## 2020-04-20 DIAGNOSIS — R7303 Prediabetes: Secondary | ICD-10-CM

## 2020-04-20 DIAGNOSIS — E669 Obesity, unspecified: Secondary | ICD-10-CM

## 2020-04-20 DIAGNOSIS — R1013 Epigastric pain: Secondary | ICD-10-CM | POA: Diagnosis not present

## 2020-04-21 NOTE — Progress Notes (Signed)
Chief Complaint:   OBESITY [name] is here to discuss her progress with her obesity treatment plan along with follow-up of her obesity related diagnoses. [name] is on the Category 2 Plan and states she is following her eating plan approximately 20% of the time. [name] states she is doing cardio for 30-45 minutes 3 times per week.  Today's visit was #: 10 Starting weight: 223 lbs Starting date: 10/01/2019 Today's weight: 212 lbs Today's date: 04/20/2020 Total lbs lost to date: 11 lbs Total lbs lost since last in-office visit: 1 lb Total weight loss percentage to date: -4.93%  Interim History: [name] says, "I want good food.  Something with taste - salty, sweet."  [name] provided the following food recall today:  Breakfast:  Protein shake, banana. Lunch:  Skipped. Dinner:  Meatloaf. Snacks:  Popcorn, animal crackers. [name] says she woke up and ate potato chips at 2 am.   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  Counseling . Night Eating Syndrome is characterized by eating after awakening from sleep or by excessive food consumption (usually > 25% of daily calories) after the evening meal. It is "relapsing and remitting," meaning that it comes and goes. It usually worsens during stressful times in your life. The person is aware and recalls the eating. Night Eating Syndrome usually starts in early adulthood. Men and women are both affected, but women tend to have more severe symptoms.  . Interesting statistics: o The prevalence of NES is 1.5% of the general population, but found in 8.9% of patients that enroll in an obesity medicine program. Approximately 15-20% ALSO have Binge Eating Disorder.  . Treatment for Night Eating Syndrome include: o CBT, SSRIs, Topiramate, Progressive Muscle Relaxation, Phototherapy, and Normalized Eating.  2.  Epigastric pain [name] is having epigastric pain with radiation to her back.  She says she "felt sick".  She had a negative stress test.  She did not have labs. If she has new pain, will come in for future labs - CBC, CMP, lipase.  DDX:  PUD (history of), pancreatitis, cholecystitis.  - CBC with Differential/Platelet - Comprehensive metabolic panel - Lipase  3. Class 1 obesity with serious comorbidity and body mass index (BMI) of 34.0 to 34.9 in adult, unspecified obesity type [name] is currently in the action stage of change. As such, her goal is to continue with weight loss efforts. She has agreed to the Category 2 Plan.   Exercise goals: For substantial health benefits, adults should do at least 150 minutes (2 hours and 30 minutes) a week of moderate-intensity, or 75 minutes (1 hour and 15 minutes) a week of vigorous-intensity aerobic physical activity, or an equivalent combination of moderate- and vigorous-intensity aerobic activity. Aerobic activity should be performed in episodes of at least 10 minutes, and preferably, it should be spread throughout the week.  Behavioral modification strategies: increasing lean protein intake.  [name] has agreed to follow-up with our clinic in 3 weeks. She was informed of the importance of frequent follow-up visits to maximize her success with intensive lifestyle modifications for her multiple health conditions.   Objective:   Blood pressure 111/70, pulse 68, temperature 98.1 F (36.7 C), temperature source Oral, height 5\' 6"  (1.676 m), weight 212 lb (96.2 kg), SpO2 100 %. Body mass index is 34.22 kg/m.  General: Cooperative, alert, well developed, in no acute distress. HEENT: Conjunctivae and  lids unremarkable. Cardiovascular: Regular rhythm.  Lungs: Normal work of breathing. Neurologic: No focal deficits.   Lab Results  Component Value Date   CREATININE 0.93 02/16/2020   BUN 21 02/16/2020   NA 138 02/16/2020   K 4.2 02/16/2020   CL 104  02/16/2020   CO2 28 02/16/2020   Lab Results  Component Value Date   ALT 41 (H) 12/07/2019   AST 37 12/07/2019   ALKPHOS 102 12/07/2019   BILITOT 0.5 12/07/2019   Lab Results  Component Value Date   HGBA1C 5.3 12/07/2019   HGBA1C 5.7 (H) 10/01/2019   HGBA1C 5.4 12/04/2018   HGBA1C 5.6 11/27/2017   HGBA1C 5.6 04/12/2017   Lab Results  Component Value Date   INSULIN 15.6 10/01/2019   Lab Results  Component Value Date   TSH 1.86 02/16/2020   Lab Results  Component Value Date   CHOL 147 12/07/2019   HDL 46.60 12/07/2019   LDLCALC 77 12/07/2019   LDLDIRECT 141.8 07/04/2007   TRIG 117.0 12/07/2019   CHOLHDL 3 12/07/2019   Lab Results  Component Value Date   WBC 3.5 (L) 12/07/2019   HGB 12.8 12/07/2019   HCT 39.0 12/07/2019   MCV 91.4 12/07/2019   PLT 178.0 12/07/2019   Lab Results  Component Value Date   IRON 89 10/01/2019   TIBC 335 10/01/2019   FERRITIN 178 (H) 10/01/2019   Obesity Behavioral Intervention:   Approximately 15 minutes were spent on the discussion below.  ASK: We discussed the diagnosis of obesity with [name] today and [name] agreed to give [place] permission to discuss obesity behavioral modification therapy today.  ASSESS: [name] has the diagnosis of obesity and her BMI today is 34.3. [name] is in the action stage of change.   ADVISE: [name] was educated on the multiple health risks of obesity as well as the benefit of weight loss to improve her health. She was advised of the need for long term treatment and the importance of lifestyle modifications to improve her current health and to decrease her risk of future health problems.  AGREE: Multiple dietary modification options and treatment options were discussed and [name] agreed to follow the recommendations documented in the above note.  ARRANGE: [name] was educated on the importance of frequent visits to treat obesity as outlined per CMS and USPSTF guidelines and agreed to schedule her next follow  up appointment today.  Attestation Statements:   Reviewed by clinician on day of visit: allergies, medications, problem list, medical history, surgical history, family history, social history, and previous encounter notes.  I, Water quality scientist, CMA, am acting as transcriptionist for Briscoe Deutscher, DO  I have reviewed the above documentation for accuracy and completeness, and I agree with the above. Briscoe Deutscher, DO

## 2020-04-28 ENCOUNTER — Telehealth: Payer: Self-pay | Admitting: Pulmonary Disease

## 2020-04-28 MED ORDER — MONTELUKAST SODIUM 10 MG PO TABS
10.0000 mg | ORAL_TABLET | Freq: Every day | ORAL | 3 refills | Status: DC
Start: 2020-04-28 — End: 2020-07-20

## 2020-04-28 MED ORDER — MONTELUKAST SODIUM 10 MG PO TABS: 10.0000 mg | ORAL_TABLET | Freq: Every day | ORAL | 0 refills | Status: DC

## 2020-04-28 NOTE — Telephone Encounter (Signed)
Called and spoke with pt letting her know that I was going to refill her montelukast and she verbalized understanding. Also stated to her that we needed to get her scheduled for a f/u with VS and she verbalized understanding. appt has been scheduled. Nothing further needed.

## 2020-04-30 ENCOUNTER — Ambulatory Visit (INDEPENDENT_AMBULATORY_CARE_PROVIDER_SITE_OTHER): Payer: Medicare Other

## 2020-04-30 ENCOUNTER — Other Ambulatory Visit: Payer: Self-pay

## 2020-04-30 DIAGNOSIS — Z23 Encounter for immunization: Secondary | ICD-10-CM

## 2020-05-05 ENCOUNTER — Encounter: Payer: Self-pay | Admitting: Family Medicine

## 2020-05-05 ENCOUNTER — Ambulatory Visit (INDEPENDENT_AMBULATORY_CARE_PROVIDER_SITE_OTHER): Payer: Medicare Other | Admitting: Family Medicine

## 2020-05-05 ENCOUNTER — Other Ambulatory Visit: Payer: Self-pay

## 2020-05-05 VITALS — BP 126/68 | HR 72 | Temp 96.8°F | Ht 66.0 in | Wt 214.2 lb

## 2020-05-05 DIAGNOSIS — N3000 Acute cystitis without hematuria: Secondary | ICD-10-CM

## 2020-05-05 DIAGNOSIS — G4700 Insomnia, unspecified: Secondary | ICD-10-CM | POA: Diagnosis not present

## 2020-05-05 DIAGNOSIS — R350 Frequency of micturition: Secondary | ICD-10-CM | POA: Diagnosis not present

## 2020-05-05 DIAGNOSIS — S3992XA Unspecified injury of lower back, initial encounter: Secondary | ICD-10-CM | POA: Diagnosis not present

## 2020-05-05 DIAGNOSIS — N390 Urinary tract infection, site not specified: Secondary | ICD-10-CM | POA: Insufficient documentation

## 2020-05-05 LAB — POC URINALSYSI DIPSTICK (AUTOMATED)
Bilirubin, UA: 1
Blood, UA: 200
Glucose, UA: NEGATIVE
Ketones, UA: NEGATIVE
Nitrite, UA: POSITIVE
Protein, UA: POSITIVE — AB
Spec Grav, UA: >=1.03 — AB (ref 1.010–1.025)
Urobilinogen, UA: 0.2 E.U./dL
pH, UA: 6 (ref 5.0–8.0)

## 2020-05-05 MED ORDER — CEPHALEXIN 250 MG PO CAPS: 250.0000 mg | ORAL_CAPSULE | Freq: Two times a day (BID) | ORAL | 0 refills | Status: DC

## 2020-05-05 MED ORDER — DOXEPIN HCL 6 MG PO TABS: 1.0000 | ORAL_TABLET | Freq: Every evening | ORAL | 3 refills | Status: DC | PRN

## 2020-05-05 NOTE — Assessment & Plan Note (Signed)
Pos ua and voiding symptoms Will tx with keflex  Culture pending  Enc better water intake  inst to call if symptoms worsen pending cx result

## 2020-05-05 NOTE — Patient Instructions (Addendum)
Drink lots of water (aim for 64 oz) daily Take the keflex as directed   We will call you when culture returns and we will change therapy if needed   Use ice on the tailbone area when you can  Keep using a special pillow to offload the area  If no improvement-then we can get an xray   I think it is possible that [place] may cause your sleep/night time food cravings and eating  Let's try switching to doxepin 6 mg at bedtime (if any side effects or problems let me know)   Take care of yourself

## 2020-05-05 NOTE — Progress Notes (Signed)
Subjective:    Patient ID: [name], female    DOB: 1951-03-28, 69 y.o.   MRN: [id number]  This visit occurred during the SARS-CoV-2 public health emergency.  Safety protocols were in place, including screening questions prior to the visit, additional usage of staff PPE, and extensive cleaning of exam room while observing appropriate contact time as indicated for disinfecting solutions.    HPI Pt presents with urinary symptoms  Also tailbone injury  Sleep problems   Wt Readings from Last 3 Encounters:  05/05/20 214 lb 4 oz (97.2 kg)  04/20/20 212 lb (96.2 kg)  04/13/20 217 lb (98.4 kg)   34.58 kg/m   She eats at night - ravenously hungry  Aware she is eating  [place]  She wakes up at 2-3 am  She does eat less during the day to loose weight   Benadryl makes her sleepy-then wide awake     Had a partial fall into chair  Forgot she had a can of fish food in her chair-sat on it (labor day weekend)  Very painful - in low buttock/tailbone Using a special chair pad  Tramadol  Has not sought medical attention  Did get swollen and bruised  Unsure if it cut her    Urinary symptoms started last week  Full feeling bladder  Odd sensation with urination  Little volume  No odor in urine  No blood visible  Frequency and urgency (wears pad for incontinence) - feels like she needs to go every 10 min at night   ua- blood/nitrites  Results for orders placed or performed in visit on 05/05/20  POCT Urinalysis Dipstick (Automated)  Result Value Ref Range   Color, UA Amber    Clarity, UA Cloudy    Glucose, UA Negative Negative   Bilirubin, UA 1 mg/dL    Ketones, UA Negative    Spec Grav, UA >=1.030 (A) 1.010 - 1.025   Blood, UA 200 Ery/uL    pH, UA 6.0 5.0 - 8.0   Protein, UA Positive (A) Negative   Urobilinogen, UA 0.2 0.2 or 1.0 E.U./dL   Nitrite, UA Positive    Leukocytes, UA Small (1+) (A) Negative   *Note: Due to a large number of results and/or  encounters for the requested time period, some results have not been displayed. A complete set of results can be found in Results Review.    Last urine cx showed mixed flora with tr leuk on ua -june tx with keflex  Patient Active Problem List   Diagnosis Date Noted  . Tailbone injury 05/05/2020  . UTI (urinary tract infection) 05/05/2020  . Insomnia 05/05/2020  . Leg cramps 02/16/2020  . Dysuria 02/16/2020  . Right hip impingement syndrome 02/01/2020  . Right bicipital tenosynovitis 02/01/2020  . Bronchiectasis without complication ([place]) 78/24/2353  . Myofascial pain 12/21/2019  . Estrogen deficiency 12/10/2019  . Obesity (BMI 30-39.9) 12/10/2019  . Bronchiectasis with (acute) exacerbation ([place]) 10/23/2019  . Elevated LFTs 10/15/2019  . Lower abdominal pain 09/01/2019  . Neck pain 10/25/2017  . Paresthesias in left hand 10/25/2017  . Paresthesia of foot, bilateral 04/12/2017  . Primary osteoarthritis of both hands 09/27/2016  . Primary osteoarthritis of both knees 09/27/2016  . 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  . Nasopharyngitis, 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/2016  . S/P total knee replacement 02/13/2016  . Chronic pain of both knees 12/06/2015  . Globus pharyngeus 05/17/2015  . Genetic testing 12/23/2014  . Family history of breast cancer   .  Family history of colon cancer   . Family history of pancreatic cancer   . History of breast cancer 11/23/2014  . Allergy to ACE inhibitors 09/27/2014  . Prediabetes 06/23/2013  . RLS (restless legs syndrome) 01/15/2013  . Hx of Clostridium difficile infection 10/10/2012  . Dyspnea on exertion 04/02/2012  . NICM (nonischemic cardiomyopathy) ([place]) 03/07/2012  . Post-menopausal 01/11/2012  . Obstructive sleep apnea 04/04/2010  . V-tach ([place]) 01/03/2009  . ICD  [employer]  Single chamber 01/03/2009  . PFO (patent foramen ovale) 09/29/2008  . Vitamin D deficiency 08/17/2008  . Hypothyroidism 11/18/2006  . Hyperlipidemia 11/18/2006    . Depression with anxiety 11/18/2006  . Essential hypertension 11/18/2006  . Mitral valve prolapse 11/18/2006  . Cerebral aneurysm 11/18/2006  . Allergic rhinitis 11/18/2006  . GERD 11/18/2006  . Diverticulosis of colon 11/18/2006  . Fatty liver 11/18/2006  . Fibromyalgia 11/18/2006   Past Medical History:  Diagnosis Date  . AICD (automatic cardioverter/defibrillator) present   . Anxiety   . Arthritis   . Back pain   . 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 Bronchiectasis ([place])   . Cerebral aneurysm, nonruptured    had a clip put in  . CHF (congestive heart failure) ([place])   . Clostridium difficile infection   . Depressive disorder, not elsewhere classified   . Diverticulosis of colon (without mention of hemorrhage)   . Esophageal reflux   . Fatty liver   . Fibromyalgia   . Gastritis   . GERD (gastroesophageal reflux disease)   . GI bleed 2004  . Glaucoma   . Hiatal hernia   . Hyperlipidemia   . Hypertension   . Hypothyroidism   . Internal hemorrhoids   . Joint pain   . Obstructive sleep apnea (adult) (pediatric)   . Osteoarthritis   . Ostium secundum type atrial septal defect   . Other chronic nonalcoholic liver disease   . Other pulmonary embolism and infarction   . Palpitations   . Paroxysmal ventricular tachycardia ([place])   . Personal history of radiation therapy   . Pneumonia    history  . Presence of permanent cardiac pacemaker   . PUD (peptic ulcer disease)   . Radiation 02/03/15-03/10/15   Right Breast  . Sarcoid    per pt , not sure  . Schatzki's ring   . Shortness of breath   . Sleep apnea   . Stroke [facility]) 2013   tia/ pt feels it was around 2008 0r 2009  . Takotsubo syndrome   . Tubular adenoma of colon   . Unspecified transient cerebral ischemia   . Unspecified vitamin D deficiency    Past Surgical History:  Procedure Laterality Date  . ABI  2006   normal  . BREAST EXCISIONAL BIOPSY    . BREAST LUMPECTOMY WITH  RADIOACTIVE SEED LOCALIZATION Right 01/03/2015   Procedure: BREAST LUMPECTOMY WITH RADIOACTIVE SEED LOCALIZATION;  Surgeon: Autumn Messing III, MD;  Location: [facility];  Service: General;  Laterality: Right;  . CARDIAC DEFIBRILLATOR PLACEMENT  2006; 2012   BSX single chamber ICD  . carotid dopplers  2006   neg  . CEREBRAL ANEURYSM REPAIR  02/1999  . COLONOSCOPY    . hospitalization  2004   GI bleed, PUD, diverticulosis (EGD,colonscopy)  . hospitalization     PE, NSVT, s/p defib  . KNEE ARTHROSCOPY     bilateral  . LEFT HEART CATHETERIZATION WITH CORONARY ANGIOGRAM N/A 02/22/2012  Procedure: LEFT HEART CATHETERIZATION WITH CORONARY ANGIOGRAM;  Surgeon: Burnell Blanks, MD;  Location: [facility] C[facility] LAB;  Service: Cardiovascular;  Laterality: N/A;  . PARTIAL HYSTERECTOMY     Fibroids  . TONGUE BIOPSY  12/12/2017   due to sore tongue and white patches/abnormal cells  . TOTAL KNEE ARTHROPLASTY Left 02/13/2016   Procedure: TOTAL KNEE ARTHROPLASTY;  Surgeon: Vickey Huger, MD;  Location: [facility];  Service: Orthopedics;  Laterality: Left;  . UPPER GASTROINTESTINAL ENDOSCOPY     Social History   Tobacco Use  . Smoking status: Former Smoker    Packs/day: 1.00    Years: 15.00    Pack years: 15.00    Types: Cigarettes    Quit date: 08/20/1989    Years since quitting: 30.7  . Smokeless tobacco: Never Used  . Tobacco comment: Started at 58; less than 1 PPD  Vaping Use  . Vaping Use: Never used  Substance Use Topics  . Alcohol use: Yes    Alcohol/week: 0.0 standard drinks    Comment: rare  . Drug use: No   Family History  Problem Relation Age of Onset  . Diabetes Mother   . Alzheimer's disease Mother   . Hypertension Mother   . Obesity Mother   . Other Brother        Thyroid problem 10/2016  . Allergies Sister   . Heart disease Sister   . Heart disease Brother   . Prostate cancer Brother 27       same brother as throat cancer  . Throat cancer Brother        dx in his 58s; also a smoker  .  Heart disease Father   . Cancer Maternal Grandmother 22       colon cancer or abdominal cancer  . Lung cancer Maternal Grandfather 51  . Breast cancer Maternal Aunt 49  . Colon cancer Maternal Aunt 85       same sister as breast at 31  . Lung cancer Maternal Uncle   . Breast cancer Maternal Aunt        dx in her 79s  . Pancreatic cancer Cousin 51       maternal first cousin  . Breast cancer Cousin        paternal first cousin twice removed died in her 57s  . Heart disease Son        Cardiac Arrest 07/2016  . Stroke Neg Hx    Allergies  Allergen Reactions  . Ace Inhibitors Swelling    Angioedema; makes tongue "break out"   . Amitriptyline Hcl Other (See Comments)     makes her too sleepy!  . Atorvastatin Other (See Comments)    SEVERE MYALGIA  . Cymbalta [Duloxetine Hcl] Nausea Only and Other (See Comments)    Sleepiness/ sick  . Dilantin [Phenytoin Sodium Extended] Rash    Severe rash  . Paroxetine Nausea Only    Rapid heartbeat  . Ramipril Other (See Comments)    TONGUE ULCERS   . Rosuvastatin Other (See Comments)    SEVERE MYALGIA  . Carbamazepine Rash  . Codeine Itching  . Iodine-131 Other (See Comments)    Swelling at IV site only, no swelling or SOB around mouth.!  . Phenytoin Sodium Extended Rash  . Wellbutrin [Bupropion] Palpitations   Current Outpatient Medications on File Prior to Visit  Medication Sig Dispense Refill  . albuterol (PROVENTIL) (2.5 MG/3ML) 0.083% nebulizer solution USE 1 VIAL IN NEBULIZER EVERY 6 HOURS - and  as needed 75 mL 11  . albuterol (VENTOLIN HFA) 108 (90 Base) MCG/ACT inhaler Inhale 2 puffs into the lungs every 6 (six) hours as needed for wheezing or shortness of breath. 18 g 3  . Ascorbic Acid (VITAMIN C) 1000 MG tablet Take 1,000 mg by mouth daily.    [name] aspirin 81 MG tablet Take 81 mg by mouth daily.    . baclofen (LIORESAL) 10 MG tablet Take 10 mg by mouth at bedtime.    . calcium carbonate (OS-CAL - DOSED IN MG OF ELEMENTAL  CALCIUM) 1250 (500 Ca) MG tablet Take 1 tablet by mouth.    . carvedilol (COREG) 12.5 MG tablet TAKE 1 TABLET TWICE DAILY 180 tablet 1  . cholecalciferol (VITAMIN D) 1000 UNITS tablet Take 5,000 Units by mouth daily.     . fexofenadine (ALLEGRA) 180 MG tablet Take 180 mg by mouth daily as needed for allergies or rhinitis.    . fluticasone (FLONASE) 50 MCG/ACT nasal spray Place 2 sprays into both nostrils daily as needed for allergies or rhinitis. 48 g 3  . furosemide (LASIX) 40 MG tablet Take 20 mg by mouth daily. 20-40mg     . hydroxypropyl methylcellulose / hypromellose (ISOPTO TEARS / GONIOVISC) 2.5 % ophthalmic solution Place 1 drop into both eyes daily.    [name] latanoprost (XALATAN) 0.005 % ophthalmic solution Place 1 drop into both eyes at bedtime.    [name] levothyroxine (SYNTHROID) 50 MCG tablet Take 1 tablet (50 mcg total) by mouth daily. 90 tablet 3  . magnesium oxide (MAG-OX) 400 MG tablet Take 1 tablet (400 mg total) by mouth 2 (two) times daily. 60 tablet 11  . mometasone (ELOCON) 0.1 % lotion Apply 1 application topically daily as needed (rash).     . montelukast (SINGULAIR) 10 MG tablet Take 1 tablet (10 mg total) by mouth at bedtime. 90 tablet 3  . montelukast (SINGULAIR) 10 MG tablet Take 1 tablet (10 mg total) by mouth at bedtime. 30 tablet 0  . Multiple Vitamins-Minerals (MULTIVITAMIN PO) Take 1 tablet by mouth daily.    . nitroGLYCERIN (NITROSTAT) 0.4 MG SL tablet Place 1 tablet (0.4 mg total) under the tongue every 5 (five) minutes as needed. 25 tablet 3  . pantoprazole (PROTONIX) 40 MG tablet Take 1 tablet (40 mg total) by mouth daily. 90 tablet 1  . polyethylene glycol powder (GLYCOLAX/MIRALAX) powder Take 17 grams by mouth daily 1080 g 0  . potassium chloride (K-DUR) 10 MEQ tablet TAKE 1/2 TABLET (5MEQ TOTAL) EVERY DAY 45 tablet 3  . Respiratory Therapy Supplies (FLUTTER) DEVI 1 Device by Does not apply route as needed. 1 each 0  . simvastatin (ZOCOR) 40 MG tablet TAKE 1/2 TABLET  EVERY DAY 45 tablet 3  . sodium chloride HYPERTONIC 3 % nebulizer solution Take by nebulization 2 (two) times daily as needed for cough. 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 750 mL 12  . traMADol (ULTRAM) 50 MG tablet Take 1 tablet (50 mg total) by mouth 2 (two) times daily. 60 tablet 5  . zinc gluconate 50 MG tablet Take 50 mg by mouth daily.    [name] zolpidem (AMBIEN) 10 MG tablet TAKE 1 TABLET BY MOUTH AT BEDTIME AS NEEDED FOR SLEEP 90 tablet 1   No current facility-administered medications on file prior to visit.    Review of Systems  Constitutional: Positive for fatigue. Negative for activity change, appetite change, fever and unexpected weight change.  HENT: Negative for congestion, ear pain, rhinorrhea, sinus pressure and sore throat.  Eyes: Negative for pain, redness and visual disturbance.  Respiratory: Negative for cough, shortness of breath and wheezing.   Cardiovascular: Negative for chest pain and palpitations.  Gastrointestinal: Negative for abdominal pain, blood in stool, constipation and diarrhea.  Endocrine: Negative for polydipsia and polyuria.  Genitourinary: Positive for dysuria, frequency and urgency. Negative for hematuria.  Musculoskeletal: Negative for arthralgias, back pain and myalgias.       Tailbone pain  Skin: Negative for pallor and rash.  Allergic/Immunologic: Negative for environmental allergies.  Neurological: Negative for dizziness, syncope and headaches.  Hematological: Negative for adenopathy. Does not bruise/bleed easily.  Psychiatric/Behavioral: Positive for sleep disturbance. Negative for decreased concentration and dysphoric mood. The patient is not nervous/anxious.        Objective:   Physical Exam Constitutional:      General: She is not in acute distress.    Appearance: Normal appearance. She is obese. She is not ill-appearing.  HENT:     Mouth/Throat:     Mouth: Mucous membranes are moist.  Eyes:     General: No scleral icterus.    Conjunctiva/sclera:  Conjunctivae normal.     Pupils: Pupils are equal, round, and reactive to light.  Cardiovascular:     Rate and Rhythm: Normal rate and regular rhythm.  Pulmonary:     Effort: Pulmonary effort is normal. No respiratory distress.     Breath sounds: Normal breath sounds. No wheezing or rales.  Abdominal:     General: Abdomen is flat. Bowel sounds are normal. There is no distension.     Palpations: There is no mass.     Tenderness: There is abdominal tenderness. There is no right CVA tenderness or left CVA tenderness.     Comments: Suprapubic tenderness   Musculoskeletal:     Cervical back: Normal range of motion and neck supple.     Comments: Tenderness at upper buttock cleft in area of coccyx  Slight swelling No crepitus  Skin:    General: Skin is warm and dry.     Coloration: Skin is not pale.     Findings: No erythema or rash.  Neurological:     Mental Status: She is alert.     Cranial Nerves: No cranial nerve deficit.     Motor: No weakness or tremor.  Psychiatric:        Mood and Affect: Mood normal.           Assessment & Plan:   Problem List Items Addressed This Visit      Genitourinary   UTI (urinary tract infection) - Primary    Pos ua and voiding symptoms Will tx with keflex  Culture pending  Enc better water intake  inst to call if symptoms worsen pending cx result      Relevant Medications   cephALEXin (KEFLEX) 250 MG capsule   Other Relevant Orders   Urine Culture     Other   Tailbone injury    Pt sat on an object which resulted in swelling and pain  Reassuring exam  Suggested ice and continued use of offloading chair cushion  Would consider xray if worse or no improvement       Insomnia    Chronic- using ambien  She struggles with night time eating and I wonder if the Lorrin Mais is the cause  Disc alt agents as well as sleep hygeine  Given px for doxepin 6 mg to try  Will update        Other Visit  Diagnoses    Urinary frequency        Relevant Orders   POCT Urinalysis Dipstick (Automated) (Completed)

## 2020-05-05 NOTE — Assessment & Plan Note (Signed)
Chronic- using ambien  She struggles with night time eating and I wonder if the Lorrin Mais is the cause  Disc alt agents as well as sleep hygeine  Given px for doxepin 6 mg to try  Will update

## 2020-05-05 NOTE — Assessment & Plan Note (Signed)
Pt sat on an object which resulted in swelling and pain  Reassuring exam  Suggested ice and continued use of offloading chair cushion  Would consider xray if worse or no improvement

## 2020-05-07 LAB — URINE CULTURE
MICRO NUMBER:: 10959292
SPECIMEN QUALITY:: ADEQUATE

## 2020-05-09 ENCOUNTER — Telehealth: Payer: Self-pay | Admitting: *Deleted

## 2020-05-09 ENCOUNTER — Other Ambulatory Visit: Payer: Self-pay | Admitting: Family Medicine

## 2020-05-09 MED ORDER — SULFAMETHOXAZOLE-TRIMETHOPRIM 800-160 MG PO TABS: 1.0000 | ORAL_TABLET | Freq: Two times a day (BID) | ORAL | 0 refills | Status: DC

## 2020-05-09 MED ORDER — TRAZODONE HCL 50 MG PO TABS: 25.0000 mg | ORAL_TABLET | Freq: Every evening | ORAL | 3 refills | Status: DC | PRN

## 2020-05-09 NOTE — Telephone Encounter (Signed)
-----   Message from Abner Greenspan, MD sent at 05/08/2020  8:33 PM EDT ----- Urine culture is positive for infection that may not be sensitive to keflex  I want to change to bactrim (unless she is feeling better with keflex) Please send in bactrim ds 1 po bid for 5 d #10 no ref

## 2020-05-09 NOTE — Telephone Encounter (Signed)
Received fax from pharmacy saying that doxepin isn't on formulary and at PA would need to be complete to try and get it approved but more then likely will be denied due to pt having to try and fail trazodone 1st. Insurance will cover trazodone. If okay pharmacy request Rx for trazodone sent to them, unless wants to proceed with PA on doxepin  [facility] [address]

## 2020-05-09 NOTE — Telephone Encounter (Signed)
LAST APPOINTMENT DATE: 05/05/2020   NEXT APPOINTMENT DATE: no f/u     LAST REFILL: 11/19/2019  QTY: 90 with no rf

## 2020-05-09 NOTE — Telephone Encounter (Signed)
Trazodone is ok with me -please ask pt if she is ok trying this first (also good)

## 2020-05-09 NOTE — Telephone Encounter (Signed)
Pt notified of urine cx and Dr. Marliss Coots comments. Pt was still having sxs so new Rx sent to pharmacy

## 2020-05-09 NOTE — Telephone Encounter (Signed)
Please decline for now-trying trazodone first

## 2020-05-09 NOTE — Telephone Encounter (Signed)
Pt has never tried the trazodone so she is okay taking med. Also I see a refill request for [name], pt said she has requested that over the weekend due to the other med not being covered but she said since PCP is sending in trazodone she's okay with PCP refusing the Ambien Rx

## 2020-05-11 ENCOUNTER — Ambulatory Visit (INDEPENDENT_AMBULATORY_CARE_PROVIDER_SITE_OTHER): Payer: Medicare Other | Admitting: Family Medicine

## 2020-05-11 ENCOUNTER — Encounter (INDEPENDENT_AMBULATORY_CARE_PROVIDER_SITE_OTHER): Payer: Self-pay | Admitting: Family Medicine

## 2020-05-11 ENCOUNTER — Other Ambulatory Visit: Payer: Self-pay

## 2020-05-11 VITALS — BP 110/72 | HR 61 | Temp 97.5°F | Ht 66.0 in | Wt 215.0 lb

## 2020-05-11 DIAGNOSIS — Z6834 Body mass index (BMI) 34.0-34.9, adult: Secondary | ICD-10-CM | POA: Diagnosis not present

## 2020-05-11 DIAGNOSIS — G478 Other sleep disorders: Secondary | ICD-10-CM

## 2020-05-11 DIAGNOSIS — E669 Obesity, unspecified: Secondary | ICD-10-CM

## 2020-05-11 DIAGNOSIS — S3992XD Unspecified injury of lower back, subsequent encounter: Secondary | ICD-10-CM

## 2020-05-11 NOTE — Progress Notes (Signed)
Cardiology Office Note   Date:  05/12/2020   ID:  [name], [name] June 22, 1951, MRN [id number]  PCP:  Abner Greenspan, MD    No chief complaint on file.  NICM  Wt Readings from Last 3 Encounters:  05/12/20 217 lb 3.2 oz (98.5 kg)  05/11/20 215 lb (97.5 kg)  05/05/20 214 lb 4 oz (97.2 kg)       History of Present Illness: [name] is a 69 y.o. female  who had a nonischemic cardiomyopathy.  In 2013, ejection fraction was listed at 35 to 40%.  Most recent echocardiogram in 2018 that showed that her LVEF has normalized.  She did have a defibrillator placed for ventricular tachycardia.  No CAD by cath in approximately 2006.  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   She had a CXR and there was a question of enlarged heart.    She was having some palpitations, and Zio patch was ok in 11/2019: "Symptoms of flutters assoc mostly with PVCs  Symptoms of flutters also assoc with sinus S VT Nonsustained 6 episodes; fastest 141 bpm for 6 beats; longest for 7 beats at 100 bpm "  She received her COVID vaccines.   Walking is limited by knee pain.  She has some DOE.  She does not use the Lasix as prescribed.  She only takes it prn.    EF 2018 showed normal LVEF.  Past Medical History:  Diagnosis Date  . AICD (automatic cardioverter/defibrillator) present   . Anxiety   . Arthritis   . Back pain   . Breast cancer ([place]) 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  . Bronchiectasis ([place])   . Cerebral aneurysm, nonruptured    had a clip put in  . CHF (congestive heart failure) ([place])   . Clostridium difficile infection   . Depressive disorder, not elsewhere classified   . Diverticulosis of colon (without mention of hemorrhage)   . Esophageal reflux   . Fatty liver   . Fibromyalgia   . Gastritis   . GERD (gastroesophageal reflux disease)   . GI bleed 2004  . Glaucoma   . Hiatal hernia   . Hyperlipidemia   .  Hypertension   . Hypothyroidism   . Internal hemorrhoids   . Joint pain   . Obstructive sleep apnea (adult) (pediatric)   . Osteoarthritis   . Ostium secundum type atrial septal defect   . Other chronic nonalcoholic liver disease   . Other pulmonary embolism and infarction   . Palpitations   . Paroxysmal ventricular tachycardia ([place])   . Personal history of radiation therapy   . Pneumonia    history  . Presence of permanent cardiac pacemaker   . PUD (peptic ulcer disease)   . Radiation 02/03/15-03/10/15   Right Breast  . Sarcoid    per pt , not sure  . Schatzki's ring   . Shortness of breath   . Sleep apnea   . Stroke [facility]) 2013   tia/ pt feels it was around 2008 0r 2009  . Takotsubo syndrome   . Tubular adenoma of colon   . Unspecified transient cerebral ischemia   . Unspecified vitamin D deficiency     Past Surgical History:  Procedure Laterality Date  . ABI  2006   normal  . BREAST EXCISIONAL BIOPSY    . BREAST LUMPECTOMY WITH RADIOACTIVE SEED LOCALIZATION Right 01/03/2015   Procedure: BREAST LUMPECTOMY WITH RADIOACTIVE SEED  LOCALIZATION;  Surgeon: Autumn Messing III, MD;  Location: [facility];  Service: General;  Laterality: Right;  . CARDIAC DEFIBRILLATOR PLACEMENT  2006; 2012   BSX single chamber ICD  . carotid dopplers  2006   neg  . CEREBRAL ANEURYSM REPAIR  02/1999  . COLONOSCOPY    . hospitalization  2004   GI bleed, PUD, diverticulosis (EGD,colonscopy)  . hospitalization     PE, NSVT, s/p defib  . KNEE ARTHROSCOPY     bilateral  . LEFT HEART CATHETERIZATION WITH CORONARY ANGIOGRAM N/A 02/22/2012   Procedure: LEFT HEART CATHETERIZATION WITH CORONARY ANGIOGRAM;  Surgeon: Burnell Blanks, MD;  Location: [facility] CATH LAB;  Service: Cardiovascular;  Laterality: N/A;  . PARTIAL HYSTERECTOMY     Fibroids  . TONGUE BIOPSY  12/12/2017   due to sore tongue and white patches/abnormal cells  . TOTAL KNEE ARTHROPLASTY Left 02/13/2016   Procedure: TOTAL KNEE ARTHROPLASTY;   Surgeon: Vickey Huger, MD;  Location: [facility];  Service: Orthopedics;  Laterality: Left;  . UPPER GASTROINTESTINAL ENDOSCOPY       Current Outpatient Medications  Medication Sig Dispense Refill  . albuterol (PROVENTIL) (2.5 MG/3ML) 0.083% nebulizer solution USE 1 VIAL IN NEBULIZER EVERY 6 HOURS - and as needed 75 mL 11  . albuterol (VENTOLIN HFA) 108 (90 Base) MCG/ACT inhaler Inhale 2 puffs into the lungs every 6 (six) hours as needed for wheezing or shortness of breath. 18 g 3  . Ascorbic Acid (VITAMIN C) 1000 MG tablet Take 1,000 mg by mouth daily.    [name] aspirin 81 MG tablet Take 81 mg by mouth daily.    . baclofen (LIORESAL) 10 MG tablet Take 10 mg by mouth at bedtime.    . calcium carbonate (OS-CAL - DOSED IN MG OF ELEMENTAL CALCIUM) 1250 (500 Ca) MG tablet Take 1 tablet by mouth.    . carvedilol (COREG) 12.5 MG tablet TAKE 1 TABLET TWICE DAILY 180 tablet 1  . cholecalciferol (VITAMIN D) 1000 UNITS tablet Take 5,000 Units by mouth daily.     . fexofenadine (ALLEGRA) 180 MG tablet Take 180 mg by mouth daily as needed for allergies or rhinitis.    . fluticasone (FLONASE) 50 MCG/ACT nasal spray Place 2 sprays into both nostrils daily as needed for allergies or rhinitis. 48 g 3  . furosemide (LASIX) 40 MG tablet Take 20 mg by mouth daily. 20-40mg     . hydroxypropyl methylcellulose / hypromellose (ISOPTO TEARS / GONIOVISC) 2.5 % ophthalmic solution Place 1 drop into both eyes daily.    [name] latanoprost (XALATAN) 0.005 % ophthalmic solution Place 1 drop into both eyes at bedtime.    [name] levothyroxine (SYNTHROID) 50 MCG tablet Take 1 tablet (50 mcg total) by mouth daily. 90 tablet 3  . magnesium oxide (MAG-OX) 400 MG tablet Take 1 tablet (400 mg total) by mouth 2 (two) times daily. 60 tablet 11  . mometasone (ELOCON) 0.1 % lotion Apply 1 application topically daily as needed (rash).     . montelukast (SINGULAIR) 10 MG tablet Take 1 tablet (10 mg total) by mouth at bedtime. 90 tablet 3  . montelukast  (SINGULAIR) 10 MG tablet Take 1 tablet (10 mg total) by mouth at bedtime. 30 tablet 0  . Multiple Vitamins-Minerals (MULTIVITAMIN PO) Take 1 tablet by mouth daily.    . nitroGLYCERIN (NITROSTAT) 0.4 MG SL tablet Place 1 tablet (0.4 mg total) under the tongue every 5 (five) minutes as needed. 25 tablet 3  . pantoprazole (PROTONIX) 40 MG  tablet Take 1 tablet (40 mg total) by mouth daily. 90 tablet 1  . polyethylene glycol powder (GLYCOLAX/MIRALAX) powder Take 17 grams by mouth daily 1080 g 0  . potassium chloride (K-DUR) 10 MEQ tablet TAKE 1/2 TABLET (5MEQ TOTAL) EVERY DAY 45 tablet 3  . Respiratory Therapy Supplies (FLUTTER) DEVI 1 Device by Does not apply route as needed. 1 each 0  . simvastatin (ZOCOR) 40 MG tablet TAKE 1/2 TABLET EVERY DAY 45 tablet 3  . sodium chloride HYPERTONIC 3 % nebulizer solution Take by nebulization 2 (two) times daily as needed for cough. 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 750 mL 12  . sulfamethoxazole-trimethoprim (BACTRIM DS) 800-160 MG tablet Take 1 tablet by mouth 2 (two) times daily. 10 tablet 0  . traMADol (ULTRAM) 50 MG tablet Take 1 tablet (50 mg total) by mouth 2 (two) times daily. 60 tablet 5  . traZODone (DESYREL) 50 MG tablet Take 0.5-1 tablets (25-50 mg total) by mouth at bedtime as needed for sleep. 30 tablet 3  . zinc gluconate 50 MG tablet Take 50 mg by mouth daily.    [name] zolpidem (AMBIEN) 10 MG tablet TAKE 1 TABLET BY MOUTH AT BEDTIME AS NEEDED FOR SLEEP 90 tablet 1   No current facility-administered medications for this visit.    Allergies:   Ace inhibitors, Amitriptyline hcl, Atorvastatin, Cymbalta [duloxetine hcl], Dilantin [phenytoin sodium extended], Paroxetine, Ramipril, Rosuvastatin, Carbamazepine, Codeine, Iodine-131, Phenytoin sodium extended, and Wellbutrin [bupropion]    Social History:  The patient  reports that she quit smoking about 30 years ago. Her smoking use included cigarettes. She has a 15.00 pack-year smoking history. She has never used  smokeless tobacco. She reports current alcohol use. She reports that she does not use drugs.   Family History:  The patient's family history includes Allergies in her sister; Alzheimer's disease in her mother; Breast cancer in her cousin and maternal aunt; Breast cancer (age of onset: 60) in her maternal aunt; Cancer (age of onset: 89) in her maternal grandmother; Colon cancer (age of onset: 54) in her maternal aunt; Diabetes in her mother; Heart disease in her brother, father, sister, and son; Hypertension in her mother; Lung cancer in her maternal uncle; Lung cancer (age of onset: 22) in her maternal grandfather; Obesity in her mother; Other in her brother; Pancreatic cancer (age of onset: 28) in her cousin; Prostate cancer (age of onset: 74) in her brother; Throat cancer in her brother.    ROS:  Please see the history of present illness.   Otherwise, review of systems are positive for knee pain.   All other systems are reviewed and negative.    PHYSICAL EXAM: VS:  BP 110/62   Pulse 68   Wt 217 lb 3.2 oz (98.5 kg)   LMP  (LMP Unknown)   SpO2 96%   BMI 35.06 kg/m  , BMI Body mass index is 35.06 kg/m. GEN: Well nourished, well developed, in no acute distress  HEENT: normal  Neck: no JVD, carotid bruits, or masses Cardiac: RRR; no murmurs, rubs, or gallops,no edema  Respiratory:  clear to auscultation bilaterally, normal work of breathing GI: soft, nontender, nondistended, + BS MS: no deformity or atrophy  Skin: warm and dry, no rash Neuro:  Strength and sensation are intact Psych: euthymic mood, full affect    Recent Labs: 12/07/2019: ALT 41; Hemoglobin 12.8; Platelets 178.0 02/16/2020: BUN 21; Creatinine, Ser 0.93; Magnesium 2.3; Potassium 4.2; Sodium 138; TSH 1.86   Lipid Panel    Component Value  Date/Time   CHOL 147 12/07/2019 1053   CHOL 171 10/01/2019 1408   TRIG 117.0 12/07/2019 1053   HDL 46.60 12/07/2019 1053   HDL 47 10/01/2019 1408   CHOLHDL 3 12/07/2019 1053   VLDL  23.4 12/07/2019 1053   LDLCALC 77 12/07/2019 1053   LDLCALC 97 10/01/2019 1408   LDLDIRECT 141.8 07/04/2007 0936     Other studies Reviewed: Additional studies/ records that were reviewed today with results demonstrating: echo reviewed.   ASSESSMENT AND PLAN:  1. Prior NICM: EF has normalized.  Appears euvolemic.    2. HTN: The current medical regimen is effective;  continue present plan and medications. 3. Morbid obesity: Whole food, plant based diet. Exercise target noted below. 4. Knee pain: Chronic.  5. ICD per EP. Followed by Dr. Caryl Comes.    Current medicines are reviewed at length with the patient today.  The patient concerns regarding her medicines were addressed.  The following changes have been made:  No change  Labs/ tests ordered today include:  No orders of the defined types were placed in this encounter.   Recommend 150 minutes/week of aerobic exercise Low fat, low carb, high fiber diet recommended  Disposition:   FU in 1 year   Signed, Larae Grooms, MD  05/12/2020 2:02 PM    [facility] [place], [place], [place]  [address] Phone: [phone]; Fax: [phone]

## 2020-05-12 ENCOUNTER — Encounter: Payer: Self-pay | Admitting: Interventional Cardiology

## 2020-05-12 ENCOUNTER — Ambulatory Visit (INDEPENDENT_AMBULATORY_CARE_PROVIDER_SITE_OTHER): Payer: Medicare Other | Admitting: Interventional Cardiology

## 2020-05-12 VITALS — BP 110/62 | HR 68 | Wt 217.2 lb

## 2020-05-12 DIAGNOSIS — I10 Essential (primary) hypertension: Secondary | ICD-10-CM

## 2020-05-12 DIAGNOSIS — I428 Other cardiomyopathies: Secondary | ICD-10-CM | POA: Diagnosis not present

## 2020-05-12 DIAGNOSIS — Z95810 Presence of automatic (implantable) cardiac defibrillator: Secondary | ICD-10-CM | POA: Diagnosis not present

## 2020-05-12 MED ORDER — CARVEDILOL 12.5 MG PO TABS
12.5000 mg | ORAL_TABLET | Freq: Two times a day (BID) | ORAL | 1 refills | Status: DC
Start: 2020-05-12 — End: 2020-06-08

## 2020-05-12 NOTE — Patient Instructions (Signed)
Medication Instructions:  Your physician recommends that you continue on your current medications as directed. Please refer to the Current Medication list given to you today.  Take furosemide (lasix) as prescribed  *If you need a refill on your cardiac medications before your next appointment, please call your pharmacy*   Lab Work: None  If you have labs (blood work) drawn today and your tests are completely normal, you will receive your results only by:  [facility] (if you have MyChart) OR  A paper copy in the mail If you have any lab test that is abnormal or we need to change your treatment, we will call you to review the results.   Testing/Procedures: None   Follow-Up: At [facility], you and your health needs are our priority.  As part of our continuing mission to provide you with exceptional heart care, we have created designated Provider Care Teams.  These Care Teams include your primary Cardiologist (physician) and Advanced Practice Providers (APPs -  Physician Assistants and Nurse Practitioners) who all work together to provide you with the care you need, when you need it.  We recommend signing up for the patient portal called "MyChart".  Sign up information is provided on this After Visit Summary.  MyChart is used to connect with patients for Virtual Visits (Telemedicine).  Patients are able to view lab/test results, encounter notes, upcoming appointments, etc.  Non-urgent messages can be sent to your provider as well.   To learn more about what you can do with MyChart, go to [email or web address].    Your next appointment:   12 month(s)  The format for your next appointment:   In Person  Provider:   You may see Larae Grooms, MD or one of the following Advanced Practice Providers on your designated Care Team:    Melina Copa, PA-C  Ermalinda Barrios, PA-C    Other Instructions  High-Fiber Diet Fiber, also called dietary fiber, is a type of carbohydrate  that is found in fruits, vegetables, whole grains, and beans. A high-fiber diet can have many health benefits. Your health care provider may recommend a high-fiber diet to help:  Prevent constipation. Fiber can make your bowel movements more regular.  Lower your cholesterol.  Relieve the following conditions: ? Swelling of veins in the anus (hemorrhoids). ? Swelling and irritation (inflammation) of specific areas of the digestive tract (uncomplicated diverticulosis). ? A problem of the large intestine (colon) that sometimes causes pain and diarrhea (irritable bowel syndrome, IBS).  Prevent overeating as part of a weight-loss plan.  Prevent heart disease, type 2 diabetes, and certain cancers. What is my plan? The recommended daily fiber intake in grams (g) includes:  38 g for men age 58 or younger.  30 g for men over age 83.  9 g for women age 21 or younger.  21 g for women over age 73. You can get the recommended daily intake of dietary fiber by:  Eating a variety of fruits, vegetables, grains, and beans.  Taking a fiber supplement, if it is not possible to get enough fiber through your diet. What do I need to know about a high-fiber diet?  It is better to get fiber through food sources rather than from fiber supplements. There is not a lot of research about how effective supplements are.  Always check the fiber content on the nutrition facts label of any prepackaged food. Look for foods that contain 5 g of fiber or more per serving.  Talk with a  diet and nutrition specialist (dietitian) if you have questions about specific foods that are recommended or not recommended for your medical condition, especially if those foods are not listed below.  Gradually increase how much fiber you consume. If you increase your intake of dietary fiber too quickly, you may have bloating, cramping, or gas.  Drink plenty of water. Water helps you to digest fiber. What are tips for following this  plan?  Eat a wide variety of high-fiber foods.  Make sure that half of the grains that you eat each day are whole grains.  Eat breads and cereals that are made with whole-grain flour instead of refined flour or white flour.  Eat brown rice, bulgur wheat, or millet instead of white rice.  Start the day with a breakfast that is high in fiber, such as a cereal that contains 5 g of fiber or more per serving.  Use beans in place of meat in soups, salads, and pasta dishes.  Eat high-fiber snacks, such as berries, raw vegetables, nuts, and popcorn.  Choose whole fruits and vegetables instead of processed forms like juice or sauce. What foods can I eat?  Fruits Berries. Pears. Apples. Oranges. Avocado. Prunes and raisins. Dried figs. Vegetables Sweet potatoes. Spinach. Kale. Artichokes. Cabbage. Broccoli. Cauliflower. Green peas. Carrots. Squash. Grains Whole-grain breads. Multigrain cereal. Oats and oatmeal. Brown rice. Barley. Bulgur wheat. Odin. Quinoa. Bran muffins. Popcorn. Rye wafer crackers. Meats and other proteins Navy, kidney, and pinto beans. Soybeans. Split peas. Lentils. Nuts and seeds. Dairy Fiber-fortified yogurt. Beverages Fiber-fortified soy milk. Fiber-fortified orange juice. Other foods Fiber bars. The items listed above may not be a complete list of recommended foods and beverages. Contact a dietitian for more options. What foods are not recommended? Fruits Fruit juice. Cooked, strained fruit. Vegetables Fried potatoes. Canned vegetables. Well-cooked vegetables. Grains White bread. Pasta made with refined flour. White rice. Meats and other proteins Fatty cuts of meat. Fried chicken or fried fish. Dairy Milk. Yogurt. Cream cheese. Sour cream. Fats and oils Butters. Beverages Soft drinks. Other foods Cakes and pastries. The items listed above may not be a complete list of foods and beverages to avoid. Contact a dietitian for more  information. Summary  Fiber is a type of carbohydrate. It is found in fruits, vegetables, whole grains, and beans.  There are many health benefits of eating a high-fiber diet, such as preventing constipation, lowering blood cholesterol, helping with weight loss, and reducing your risk of heart disease, diabetes, and certain cancers.  Gradually increase your intake of fiber. Increasing too fast can result in cramping, bloating, and gas. Drink plenty of water while you increase your fiber.  The best sources of fiber include whole fruits and vegetables, whole grains, nuts, seeds, and beans. This information is not intended to replace advice given to you by your health care provider. Make sure you discuss any questions you have with your health care provider. Document Revised: 06/10/2017 Document Reviewed: 06/10/2017 Elsevier Patient Education  2020 [employer].

## 2020-05-12 NOTE — Progress Notes (Signed)
Chief Complaint:   OBESITY [name] is here to discuss her progress with her obesity treatment plan along with follow-up of her obesity related diagnoses. [name] is on the Category 2 Plan and states she is following her eating plan approximately 0% of the time. [name] states she is exercising for 0 minutes 0 times per week.  Today's visit was #: 11 Starting weight: 223 lbs Starting date: 10/01/2019 Today's weight: 215 lbs Today's date: 05/11/2020 Total lbs lost to date: 8 lbs Total lbs lost since last in-office visit: 0 Total weight l oss percentage to date: -3.59%  Interim History: [name] is seeing Cardiology tomorrow.  She says she recently hurt her tailbone.  She is still with epigastric discomfort.  Assessment/Plan:   1. Injury of coccyx, subsequent encounter [name] injured her coccyx after sitting on an a can of fish food while cleaning her pond, which resulted in pain. She continues to have significant discomfort. Will continue to monitor symptoms as they relate to her weight loss journey.  2. Nocturnal sleep-related eating disorder Night Eating Syndrome is characterized by eating after awakening from sleep or by excessive food consumption (usually > 25% of daily calories) after the evening meal. It is "relapsing and remitting," meaning that it comes and goes. It usually worsens during stressful times in your life. The person is aware and recalls the eating. Night Eating Syndrome usually starts in early adulthood. Men and women are both affected, but women tend to have more severe symptoms. The prevalence of NES is 1.5% of the general population, but found in 8.9% of patients that enroll in an obesity medicine program. Approximately 15-20% ALSO have Binge Eating Disorder.  Treatment for Night Eating Syndrome include: CBT, SSRIs, Topiramate, Progressive Muscle Relaxation, Phototherapy, and Normalized Eating.  3. Class 1 obesity with serious comorbidity and body mass index (BMI) of 34.0 to  34.9 in adult, unspecified obesity type [name] is currently in the action stage of change. As such, her goal is to continue with weight loss efforts. She has agreed to practicing portion control and making smarter food choices, such as increasing vegetables and decreasing simple carbohydrates.   Exercise goals: No exercise has been prescribed at this time.  Behavioral modification strategies: increasing lean protein intake, decreasing simple carbohydrates, increasing vegetables and increasing water intake.  [name] has agreed to follow-up with our clinic in 2-3 weeks. She was informed of the importance of frequent follow-up visits to maximize her success with intensive lifestyle modifications for her multiple health conditions.   Objective:   Blood pressure 110/72, pulse 61, temperature (!) 97.5 F (36.4 C), temperature source Oral, height 5\' 6"  (1.676 m), weight 215 lb (97.5 kg), SpO2 97 %. Body mass index is 34.7 kg/m.  General: Cooperative, alert, well developed, in no acute distress. HEENT: Conjunctivae and lids unremarkable. Cardiovascular: Regular rhythm.  Lungs: Normal work of breathing. Neurologic: No focal deficits.   Lab Results  Component Value Date   CREATININE 0.93 02/16/2020   BUN 21 02/16/2020   NA 138 02/16/2020   K 4.2 02/16/2020   CL 104 02/16/2020   CO2 28 02/16/2020   Lab Results  Component Value Date   ALT 41 (H) 12/07/2019   AST 37 12/07/2019   ALKPHOS 102 12/07/2019   BILITOT 0.5 12/07/2019   Lab Results  Component Value Date   HGBA1C 5.3 12/07/2019   HGBA1C 5.7 (H) 10/01/2019   HGBA1C 5.4 12/04/2018   HGBA1C 5.6 11/27/2017   HGBA1C 5.6 04/12/2017  Lab Results  Component Value Date   INSULIN 15.6 10/01/2019   Lab Results  Component Value Date   TSH 1.86 02/16/2020   Lab Results  Component Value Date   CHOL 147 12/07/2019   HDL 46.60 12/07/2019   LDLCALC 77 12/07/2019   LDLDIRECT 141.8 07/04/2007   TRIG 117.0 12/07/2019   CHOLHDL 3  12/07/2019   Lab Results  Component Value Date   WBC 3.5 (L) 12/07/2019   HGB 12.8 12/07/2019   HCT 39.0 12/07/2019   MCV 91.4 12/07/2019   PLT 178.0 12/07/2019   Lab Results  Component Value Date   IRON 89 10/01/2019   TIBC 335 10/01/2019   FERRITIN 178 (H) 10/01/2019   Attestation Statements:   Reviewed by clinician on day of visit: allergies, medications, problem list, medical history, surgical history, family history, social history, and previous encounter notes.  Time spent on visit including pre-visit chart review and post-visit care and charting was 30 minutes.   I, Water quality scientist, CMA, am acting as transcriptionist for Briscoe Deutscher, DO  I have reviewed the above documentation for accuracy and completeness, and I agree with the above. Briscoe Deutscher, DO

## 2020-05-13 ENCOUNTER — Encounter: Payer: Self-pay | Admitting: Family Medicine

## 2020-05-24 NOTE — Progress Notes (Signed)
Office Visit Note  Patient: [name]             Date of Birth: 09-Jul-1951           MRN: [id number]             PCP: Abner Greenspan, MD Referring: Tower, [name], MD Visit Date: 06/07/2020 Occupation: @GUAROCC @  Subjective:  Right knee joint pain   History of Present Illness: [name] is a 69 y.o. female with history of fibromyalgia, osteoarthritis, and DDD.  She continues to have generalized myalgias and muscle tenderness to generally fibromyalgia.  She takes baclofen 10 mg 1 tablet by mouth at bedtime as needed for muscle spasms and would like a refill today.  She reports that several months ago she was lifting weights and experienced increased discomfort in her right shoulder.  She states that she had a cortisone injection performed at pain management which provided significant relief.  She states that she had her COVID-19 booster on 06/04/2020 and is having some muscle soreness in her right arm.  She reports that she continues to have chronic pain in her right knee joint but she is not ready to proceed with a knee replacement at this time during the COVID-19 pandemic.  She states that she has tried a compression brace in the past but it seems to irritate her knee more than it is helpful.  She experiences intermittent swelling in her right knee joint.  She has been taking tramadol as needed for pain relief.   Activities of Daily Living:  Patient reports morning stiffness for 5  minutes.   Patient Reports nocturnal pain.  Difficulty dressing/grooming: Denies Difficulty climbing stairs: Reports Difficulty getting out of chair: Reports Difficulty using hands for taps, buttons, cutlery, and/or writing: Denies  Review of Systems  Constitutional: Negative for fatigue.  HENT: Positive for mouth dryness and nose dryness. Negative for mouth sores.   Eyes: Positive for dryness. Negative for pain and visual disturbance.  Respiratory: Positive for cough,  shortness of breath and difficulty breathing. Negative for hemoptysis.   Cardiovascular: Negative for chest pain, palpitations, hypertension and swelling in legs/feet.  Gastrointestinal: Positive for constipation. Negative for blood in stool and diarrhea.  Endocrine: Positive for increased urination.  Genitourinary: Positive for difficulty urinating and painful urination.  Musculoskeletal: Positive for arthralgias, joint pain, joint swelling and morning stiffness. Negative for myalgias, muscle weakness, muscle tenderness and myalgias.  Skin: Negative for color change, pallor, rash, hair loss, nodules/bumps, skin tightness, ulcers and sensitivity to sunlight.  Allergic/Immunologic: Negative for susceptible to infections.  Neurological: Negative for dizziness, numbness, headaches and weakness.  Hematological: Negative for swollen glands.  Psychiatric/Behavioral: Positive for sleep disturbance. Negative for depressed mood. The patient is not nervous/anxious.     PMFS History:  Patient Active Problem List   Diagnosis Date Noted  . Tailbone injury 05/05/2020  . UTI (urinary tract infection) 05/05/2020  . Insomnia 05/05/2020  . Leg cramps 02/16/2020  . Dysuria 02/16/2020  . Right hip impingement syndrome 02/01/2020  . Right bicipital tenosynovitis 02/01/2020  . Bronchiectasis without complication ([place]) 97/35/3299  . Myofascial pain 12/21/2019  . Estrogen deficiency 12/10/2019  . Obesity (BMI 30-39.9) 12/10/2019  . Bronchiectasis with (acute) exacerbation ([place]) 10/23/2019  . Elevated LFTs 10/15/2019  . Lower abdominal pain 09/01/2019  . Neck pain 10/25/2017  . Paresthesias in left hand 10/25/2017  . Paresthesia of foot, bilateral 04/12/2017  . Primary osteoarthritis of both hands 09/27/2016  . Primary  osteoarthritis of both knees 09/27/2016  . 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  . Nasopharyngitis, 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/2016  . S/P total knee replacement 02/13/2016  . Chronic pain of both  knees 12/06/2015  . Globus pharyngeus 05/17/2015  . Genetic testing 12/23/2014  . Family history of breast cancer   . Family history of colon cancer   . Family history of pancreatic cancer   . History of breast cancer 11/23/2014  . Allergy to ACE inhibitors 09/27/2014  . Prediabetes 06/23/2013  . RLS (restless legs syndrome) 01/15/2013  . Hx of Clostridium difficile infection 10/10/2012  . Dyspnea on exertion 04/02/2012  . NICM (nonischemic cardiomyopathy) ([place]) 03/07/2012  . Post-menopausal 01/11/2012  . Obstructive sleep apnea 04/04/2010  . V-tach ([place]) 01/03/2009  . ICD  [employer]  Single chamber 01/03/2009  . PFO (patent foramen ovale) 09/29/2008  . Vitamin D deficiency 08/17/2008  . Hypothyroidism 11/18/2006  . Hyperlipidemia 11/18/2006  . Depression with anxiety 11/18/2006  . Essential hypertension 11/18/2006  . Mitral valve prolapse 11/18/2006  . Cerebral aneurysm 11/18/2006  . Allergic rhinitis 11/18/2006  . GERD 11/18/2006  . Diverticulosis of colon 11/18/2006  . Fatty liver 11/18/2006  . Fibromyalgia 11/18/2006    Past Medical History:  Diagnosis Date  . AICD (automatic cardioverter/defibrillator) present   . Anxiety   . Arthritis   . Back pain   . 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 Bronchiectasis ([place])   . Cerebral aneurysm, nonruptured    had a clip put in  . CHF (congestive heart failure) ([place])   . Clostridium difficile infection   . Depressive disorder, not elsewhere classified   . Diverticulosis of colon (without mention of hemorrhage)   . Esophageal reflux   . Fatty liver   . Fibromyalgia   . Gastritis   . GERD (gastroesophageal reflux disease)   . GI bleed 2004  . Glaucoma   . Hiatal hernia   . Hyperlipidemia   . Hypertension   . Hypothyroidism   . Internal hemorrhoids   . Joint pain   . Obstructive sleep apnea (adult) (pediatric)   . Osteoarthritis   . Ostium secundum type atrial septal defect   .  Other chronic nonalcoholic liver disease   . Other pulmonary embolism and infarction   . Palpitations   . Paroxysmal ventricular tachycardia ([place])   . Personal history of radiation therapy   . Pneumonia    history  . Presence of permanent cardiac pacemaker   . PUD (peptic ulcer disease)   . Radiation 02/03/15-03/10/15   Right Breast  . Sarcoid    per pt , not sure  . Schatzki's ring   . Shortness of breath   . Sleep apnea   . Stroke [facility]) 2013   tia/ pt feels it was around 2008 0r 2009  . Takotsubo syndrome   . Tubular adenoma of colon   . Unspecified transient cerebral ischemia   . Unspecified vitamin D deficiency     Family History  Problem Relation Age of Onset  . Diabetes Mother   . Alzheimer's disease Mother   . Hypertension Mother   . Obesity Mother   . Other Brother        Thyroid problem 10/2016  . Allergies Sister   . Heart disease Sister   . Heart disease Brother   . Prostate cancer Brother 26       same brother as throat cancer  . Throat cancer Brother  dx in his 42s; also a smoker  . Heart disease Father   . Cancer Maternal Grandmother 51       colon cancer or abdominal cancer  . Lung cancer Maternal Grandfather 80  . Breast cancer Maternal Aunt 82  . Colon cancer Maternal Aunt 44       same sister as breast at 81  . Lung cancer Maternal Uncle   . Breast cancer Maternal Aunt        dx in her 28s  . Pancreatic cancer Cousin 26       maternal first cousin  . Breast cancer Cousin        paternal first cousin twice removed died in her 23s  . Heart disease Son        Cardiac Arrest 07/2016  . Stroke Neg Hx    Past Surgical History:  Procedure Laterality Date  . ABI  2006   normal  . BREAST EXCISIONAL BIOPSY    . BREAST LUMPECTOMY WITH RADIOACTIVE SEED LOCALIZATION Right 01/03/2015   Procedure: BREAST LUMPECTOMY WITH RADIOACTIVE SEED LOCALIZATION;  Surgeon: Autumn Messing III, MD;  Location: [place];  Service: General;  Laterality: Right;  . CARDIAC  DEFIBRILLATOR PLACEMENT  2006; 2012   BSX single chamber ICD  . carotid dopplers  2006   neg  . CEREBRAL ANEURYSM REPAIR  02/1999  . COLONOSCOPY    . hospitalization  2004   GI bleed, PUD, diverticulosis (EGD,colonscopy)  . hospitalization     PE, NSVT, s/p defib  . KNEE ARTHROSCOPY     bilateral  . LEFT HEART CATHETERIZATION WITH CORONARY ANGIOGRAM N/A 02/22/2012   Procedure: LEFT HEART CATHETERIZATION WITH CORONARY ANGIOGRAM;  Surgeon: Burnell Blanks, MD;  Location[facility] CATH LAB;  Service: Cardiovascular;  Laterality: N/A;  . PARTIAL HYSTERECTOMY     Fibroids  . TONGUE BIOPSY  12/12/2017   due to sore tongue and white patches/abnormal cells  . TOTAL KNEE ARTHROPLASTY Left 02/13/2016   Procedure: TOTAL KNEE ARTHROPLASTY;  Surgeon: Vickey Huger, MD;  Location: [place];  Service: Orthopedics;  Laterality: Left;  . UPPER GASTROINTESTINAL ENDOSCOPY     Social History   Social History Narrative   Lives alone   Caffeine YYQ:MGNO   Retired from [employer]   2 children   Divorced   Right handed    Immunization History  Administered Date(s) Administered  . Fluad Quad(high Dose 65+) 04/17/2019, 04/30/2020  . H1N1 07/24/2008  . Influenza Split 07/17/2011, 09/27/2011, 04/29/2012  . Influenza Whole 07/20/2004, 07/04/2007, 05/07/2008, 05/19/2009, 05/17/2010  . Influenza, High Dose Seasonal PF 05/30/2017, 05/21/2018  . Influenza,inj,Quad PF,6+ Mos 05/20/2013, 05/06/2014, 05/17/2015, 05/15/2016  . Influenza-Unspecified 05/07/2018  . PFIZER SARS-COV-2 Vaccination 09/25/2019, 10/16/2019, 06/04/2020  . Pneumococcal Conjugate-13 11/22/2015  . Pneumococcal Polysaccharide-23 05/20/2002, 05/07/2008, 06/25/2014  . Td 09/07/2009  . Zoster 09/21/2011     Objective: Vital Signs: BP 117/78 (BP Location: Left Arm, Patient Position: Sitting, Cuff Size: Normal)   Pulse 62   Resp 16   Ht 5' 6.5" (1.689 m)   Wt 219 lb (99.3 kg)   LMP  (LMP Unknown)   BMI 34.82 kg/m    Physical Exam Vitals  and nursing note reviewed.  Constitutional:      Appearance: She is well-developed.  HENT:     Head: Normocephalic and atraumatic.  Eyes:     Conjunctiva/sclera: Conjunctivae normal.  Pulmonary:     Effort: Pulmonary effort is normal.  Abdominal:     Palpations: Abdomen  is soft.  Musculoskeletal:     Cervical back: Normal range of motion.  Skin:    General: Skin is warm and dry.     Capillary Refill: Capillary refill takes less than 2 seconds.  Neurological:     Mental Status: She is alert and oriented to person, place, and time.  Psychiatric:        Behavior: Behavior normal.      Musculoskeletal Exam: C-spine limited ROM with lateral rotation. Trapezius muscle tension and tenderness noted. Thoracic and lumbar spine good ROM.  Shoulder joints, elbow joints, wrist joints, MCPs, PIPs, and DIPs good ROM with no synovitis. Complete fist formation bilaterally.  Hip joints good ROM.  Painful ROM of the right knee joint but no warmth or effusion noted.  Left knee replacement has good ROM with no discomfort.  No warmth or effusion of the left knee replacement noted.  Ankle joints good ROM with no warmth or effusion.   CDAI Exam: CDAI Score: -- Patient Global: --; Provider Global: -- Swollen: --; Tender: -- Joint Exam 06/07/2020   No joint exam has been documented for this visit   There is currently no information documented on the homunculus. Go to the Rheumatology activity and complete the homunculus joint exam.  Investigation: No additional findings.  Imaging: CUP PACEART REMOTE DEVICE CHECK  Result Date: 06/03/2020 Scheduled remote reviewed. Normal device function.  Next remote 91 days. JM   Recent Labs: Lab Results  Component Value Date   WBC 3.5 (L) 12/07/2019   HGB 12.8 12/07/2019   PLT 178.0 12/07/2019   NA 138 02/16/2020   K 4.2 02/16/2020   CL 104 02/16/2020   CO2 28 02/16/2020   GLUCOSE 104 (H) 02/16/2020   BUN 21 02/16/2020   CREATININE 0.93 02/16/2020    BILITOT 0.5 12/07/2019   ALKPHOS 102 12/07/2019   AST 37 12/07/2019   ALT 41 (H) 12/07/2019   PROT 6.8 12/07/2019   ALBUMIN 4.2 12/07/2019   CALCIUM 10.0 02/16/2020   GFRAA 82 10/01/2019    Speciality Comments: Narc Agreement: 01/16/18  Procedures:  No procedures performed Allergies: Ace inhibitors, Amitriptyline hcl, Atorvastatin, Cymbalta [duloxetine hcl], Dilantin [phenytoin sodium extended], Paroxetine, Ramipril, Rosuvastatin, Carbamazepine, Codeine, Iodine-131, Phenytoin sodium extended, and Wellbutrin [bupropion]   Assessment / Plan:     Visit Diagnoses: Fibromyalgia: She has generalized hyperalgesia and positive tender points on exam.  She continues to experience generalized myalgias and muscle tenderness.  She has trapezius muscle tension and muscle tenderness bilaterally.  She takes baclofen 10 mg 1 tablet by mouth at bedtime as needed for muscle spasms.  She continues to take tramadol as needed for pain relief.  A refill was sent to the pharmacy today.  She continues to experience chronic fatigue secondary to insomnia.  We discussed the importance of regular exercise and good sleep hygiene.  She will follow-up in the office in 6 months.   Trapezius muscle spasm: She has trapezius muscle tension and muscle tenderness bilaterally.  She is not experiencing any muscle spasms at this time.  She takes baclofen 10 mg 1 tablet by mouth at bedtime as needed for muscle spasms.  A refill was sent to the pharmacy today.  Primary osteoarthritis of right knee - X-ray findings were consistent with severe osteoarthritis and severe chondromalacia patella.  She has chronic pain and stiffness in her right knee joint.  She experiences occasional nocturnal pain and has difficulty climbing steps and rising from a seated position.  She had a  right knee joint cortisone injection performed on 12/08/2019 which provided temporary relief.  She is not ready to proceed with a knee replacement at this time.  She  declined reapplying for Visco gel injections.  We discussed the use of a compression sleeve but according to the patient it causes more irritation and alleviation of pain.  A prescription for voltaren gel was sent to the pharmacy.   Status post total knee replacement, left: Doing well. She has good ROM with no discomfort.  No warmth or effusion noted on exam.   Primary osteoarthritis of both hands: She has PIP and DIP thickening consistent with osteoarthritis of both hands.  No joint tenderness or inflammation was noted on exam.  She is able to make a complete fist bilaterally.  She is not experiencing any joint pain or stiffness in her hands at this time.  Trochanteric bursitis, right hip: She experiences intermittent discomfort due to right trochanter bursitis.  She has good range of motion of the right hip joint on examination today.  No groin pain noted.  DDD (degenerative disc disease), cervical: She has limited range of motion of the C-spine especially with lateral rotation.  We discussed the importance of performing neck exercises on a daily basis.  She is not experiencing any symptoms of radiculopathy.  Plantar fasciitis, right - Resolved  Other medical conditions are listed as follows:   History of hypertension  History of gastroesophageal reflux (GERD)  Brain aneurysm  History of sleep apnea  History of TIA (transient ischemic attack)  History of hypercholesterolemia  History of diverticulosis  History of hypothyroidism  RLS (restless legs syndrome)  History of breast cancer  Hx of Clostridium difficile infection  History of migraine  History of depression  Orders: No orders of the defined types were placed in this encounter.  Meds ordered this encounter  Medications  . baclofen (LIORESAL) 10 MG tablet    Sig: Take 1 tablet (10 mg total) by mouth at bedtime as needed for muscle spasms.    Dispense:  30 tablet    Refill:  0    Follow-Up Instructions: Return  in about 6 months (around 12/06/2020) for Fibromyalgia, Osteoarthritis, DDD.   Ofilia Neas, PA-C  Note - This record has been created using Dragon software.  Chart creation errors have been sought, but may not always  have been located. Such creation errors do not reflect on  the standard of medical care.

## 2020-05-25 ENCOUNTER — Encounter (INDEPENDENT_AMBULATORY_CARE_PROVIDER_SITE_OTHER): Payer: Self-pay | Admitting: Family Medicine

## 2020-06-01 ENCOUNTER — Ambulatory Visit (INDEPENDENT_AMBULATORY_CARE_PROVIDER_SITE_OTHER): Payer: Medicare Other

## 2020-06-01 DIAGNOSIS — I428 Other cardiomyopathies: Secondary | ICD-10-CM

## 2020-06-02 ENCOUNTER — Ambulatory Visit (INDEPENDENT_AMBULATORY_CARE_PROVIDER_SITE_OTHER): Payer: Medicare Other | Admitting: Family Medicine

## 2020-06-02 ENCOUNTER — Encounter (INDEPENDENT_AMBULATORY_CARE_PROVIDER_SITE_OTHER): Payer: Self-pay | Admitting: Family Medicine

## 2020-06-02 ENCOUNTER — Other Ambulatory Visit: Payer: Self-pay

## 2020-06-02 VITALS — BP 129/71 | HR 61 | Temp 98.6°F | Ht 66.0 in | Wt 216.0 lb

## 2020-06-02 DIAGNOSIS — K219 Gastro-esophageal reflux disease without esophagitis: Secondary | ICD-10-CM

## 2020-06-02 DIAGNOSIS — G4700 Insomnia, unspecified: Secondary | ICD-10-CM

## 2020-06-02 DIAGNOSIS — E669 Obesity, unspecified: Secondary | ICD-10-CM | POA: Diagnosis not present

## 2020-06-02 DIAGNOSIS — Z6834 Body mass index (BMI) 34.0-34.9, adult: Secondary | ICD-10-CM

## 2020-06-02 DIAGNOSIS — I10 Essential (primary) hypertension: Secondary | ICD-10-CM | POA: Diagnosis not present

## 2020-06-03 LAB — CUP PACEART REMOTE DEVICE CHECK
Battery Remaining Longevity: 36 mo
Battery Remaining Percentage: 41 %
Brady Statistic RV Percent Paced: 0 %
Date Time Interrogation Session: 20211013170700
HighPow Impedance: 69 Ohm
Implantable Lead Implant Date: 20050603
Implantable Lead Location: 753860
Implantable Lead Model: 185
Implantable Lead Serial Number: 116340
Implantable Pulse Generator Implant Date: 20110505
Lead Channel Impedance Value: 625 Ohm
Lead Channel Pacing Threshold Amplitude: 0.7 V
Lead Channel Pacing Threshold Pulse Width: 0.4 ms
Lead Channel Setting Pacing Amplitude: 2.4 V
Lead Channel Setting Pacing Pulse Width: 0.4 ms
Lead Channel Setting Sensing Sensitivity: 0.4 mV
Pulse Gen Serial Number: 266301

## 2020-06-04 ENCOUNTER — Ambulatory Visit: Payer: Medicare Other | Attending: Internal Medicine

## 2020-06-04 ENCOUNTER — Other Ambulatory Visit: Payer: Self-pay

## 2020-06-04 DIAGNOSIS — Z23 Encounter for immunization: Secondary | ICD-10-CM

## 2020-06-04 NOTE — Progress Notes (Signed)
   Covid-19 Vaccination Clinic  Name:  Kadra Kohan    MRN: [id number] DOB: February 01, 1951  06/04/2020  Ms. Belding-Thompson was observed post Covid-19 immunization for 15 minutes without incident. She was provided with Vaccine Information Sheet and instruction to access the V-Safe system.   Ms. Gordy was instructed to call 911 with any severe reactions post vaccine: [name] Difficulty breathing  . Swelling of face and throat  . A fast heartbeat  . A bad rash all over body  . Dizziness and weakness

## 2020-06-06 ENCOUNTER — Encounter (INDEPENDENT_AMBULATORY_CARE_PROVIDER_SITE_OTHER): Payer: Self-pay | Admitting: Family Medicine

## 2020-06-06 NOTE — Progress Notes (Signed)
Chief Complaint:   OBESITY [name] is here to discuss her progress with her obesity treatment plan along with follow-up of her obesity related diagnoses. [name] is on the Category 2 Plan and states she is following her eating plan approximately 10% of the time. [name] states she is exercising for 0 minutes 0 times per week.  Today's visit was #: 12 Starting weight: 223 lbs Starting date: 10/01/2019 Today's weight: 216 lbs Today's date: 06/02/2020 Total lbs lost to date: 7 lbs Total lbs lost since last in-office visit: +1 lb Total weight loss percentage to date: -3.14%  Interim History: [name] says she went to visit family and enjoyed some girl time.  She ate a lot of yummy food.  Today's bioimpedance results indicate that [name] has gained 4 pounds of water weight since her last visit.  Assessment/Plan:   1. Insomnia The problem of recurrent insomnia was discussed. We discussed several lifestyle modifications today and she will continue to work on diet, exercise and weight loss efforts.  She is taking trazodone and edibles at bedtime.  She is requesting referral to a new PCP.  - Ambulatory referral to [facility]  2. Gastroesophageal reflux disease We discussed several lifestyle modifications today and she will continue to work on diet, exercise and weight loss efforts. She is taking Protonix 40 mg daily.   - Ambulatory referral to [facility]  3. Essential hypertension At goal. Medications: Coreg 12.5 mg daily. Plan: Monitor home BP. Diet: Avoid buying foods that are: processed, frozen, or prepackaged to avoid excess salt. Recheck BP at next visit. The patient understands monitoring parameters and red flags.   BP Readings from Last 3 Encounters:  06/02/20 129/71  05/12/20 110/62  05/11/20 110/72   Lab Results  Component Value Date   CREATININE 0.93 02/16/2020   - Ambulatory referral to [facility]  4. Class 1 obesity with serious comorbidity and body mass  index (BMI) of 34.0 to 34.9 in adult  [name] is currently in the action stage of change. As such, her goal is to continue with weight loss efforts. She has agreed to 1200 calories, 95 grams of protein, >25 grams of fiber, and <2500 mg of sodium.   Exercise goals: For substantial health benefits, adults should do at least 150 minutes (2 hours and 30 minutes) a week of moderate-intensity, or 75 minutes (1 hour and 15 minutes) a week of vigorous-intensity aerobic physical activity, or an equivalent combination of moderate- and vigorous-intensity aerobic activity. Aerobic activity should be performed in episodes of at least 10 minutes, and preferably, it should be spread throughout the week.  Behavioral modification strategies: increasing lean protein intake, decreasing simple carbohydrates and increasing vegetables.  [name] has agreed to follow-up with our clinic in 3 weeks. She was informed of the importance of frequent follow-up visits to maximize her success with intensive lifestyle modifications for her multiple health conditions.   Objective:   Blood pressure 129/71, pulse 61, temperature 98.6 F (37 C), height 5\' 6"  (1.676 m), weight 216 lb (98 kg), SpO2 97 %. Body mass index is 34.86 kg/m.  General: Cooperative, alert, well developed, in no acute distress. HEENT: Conjunctivae and lids unremarkable. Cardiovascular: Regular rhythm.  Lungs: Normal work of breathing. Neurologic: No focal deficits.   Lab Results  Component Value Date   CREATININE 0.93 02/16/2020   BUN 21 02/16/2020   NA 138 02/16/2020   K 4.2 02/16/2020   CL 104 02/16/2020   CO2 28 02/16/2020  Lab Results  Component Value Date   ALT 41 (H) 12/07/2019   AST 37 12/07/2019   ALKPHOS 102 12/07/2019   BILITOT 0.5 12/07/2019   Lab Results  Component Value Date   HGBA1C 5.3 12/07/2019   HGBA1C 5.7 (H) 10/01/2019   HGBA1C 5.4 12/04/2018   HGBA1C 5.6 11/27/2017   HGBA1C 5.6 04/12/2017   Lab Results  Component  Value Date   INSULIN 15.6 10/01/2019   Lab Results  Component Value Date   TSH 1.86 02/16/2020   Lab Results  Component Value Date   CHOL 147 12/07/2019   HDL 46.60 12/07/2019   LDLCALC 77 12/07/2019   LDLDIRECT 141.8 07/04/2007   TRIG 117.0 12/07/2019   CHOLHDL 3 12/07/2019   Lab Results  Component Value Date   WBC 3.5 (L) 12/07/2019   HGB 12.8 12/07/2019   HCT 39.0 12/07/2019   MCV 91.4 12/07/2019   PLT 178.0 12/07/2019   Lab Results  Component Value Date   IRON 89 10/01/2019   TIBC 335 10/01/2019   FERRITIN 178 (H) 10/01/2019   Obesity Behavioral Intervention:   Approximately 15 minutes were spent on the discussion below.  ASK: We discussed the diagnosis of obesity with [name] today and [name] agreed to give [place] permission to discuss obesity behavioral modification therapy today.  ASSESS: [name] has the diagnosis of obesity and her BMI today is 34.9. [name] is in the action stage of change.   ADVISE: [name] was educated on the multiple health risks of obesity as well as the benefit of weight loss to improve her health. She was advised of the need for long term treatment and the importance of lifestyle modifications to improve her current health and to decrease her risk of future health problems.  AGREE: Multiple dietary modification options and treatment options were discussed and [name] agreed to follow the recommendations documented in the above note.  ARRANGE: [name] was educated on the importance of frequent visits to treat obesity as outlined per CMS and USPSTF guidelines and agreed to schedule her next follow up appointment today.  Attestation Statements:   Reviewed by clinician on day of visit: allergies, medications, problem list, medical history, surgical history, family history, social history, and previous encounter notes.  I, Water quality scientist, CMA, am acting as transcriptionist for Briscoe Deutscher, DO  I have reviewed the above documentation for accuracy and  completeness, and I agree with the above. Briscoe Deutscher, DO

## 2020-06-06 NOTE — Progress Notes (Signed)
Remote ICD transmission.   

## 2020-06-07 ENCOUNTER — Other Ambulatory Visit: Payer: Self-pay

## 2020-06-07 ENCOUNTER — Ambulatory Visit (INDEPENDENT_AMBULATORY_CARE_PROVIDER_SITE_OTHER): Payer: Medicare Other | Admitting: Physician Assistant

## 2020-06-07 ENCOUNTER — Encounter: Payer: Self-pay | Admitting: Physician Assistant

## 2020-06-07 VITALS — BP 117/78 | HR 62 | Resp 16 | Ht 66.5 in | Wt 219.0 lb

## 2020-06-07 DIAGNOSIS — M62838 Other muscle spasm: Secondary | ICD-10-CM

## 2020-06-07 DIAGNOSIS — M5030 Other cervical disc degeneration, unspecified cervical region: Secondary | ICD-10-CM

## 2020-06-07 DIAGNOSIS — Z853 Personal history of malignant neoplasm of breast: Secondary | ICD-10-CM

## 2020-06-07 DIAGNOSIS — M797 Fibromyalgia: Secondary | ICD-10-CM | POA: Diagnosis not present

## 2020-06-07 DIAGNOSIS — Z8659 Personal history of other mental and behavioral disorders: Secondary | ICD-10-CM

## 2020-06-07 DIAGNOSIS — Z8673 Personal history of transient ischemic attack (TIA), and cerebral infarction without residual deficits: Secondary | ICD-10-CM

## 2020-06-07 DIAGNOSIS — M19041 Primary osteoarthritis, right hand: Secondary | ICD-10-CM

## 2020-06-07 DIAGNOSIS — Z8679 Personal history of other diseases of the circulatory system: Secondary | ICD-10-CM

## 2020-06-07 DIAGNOSIS — M1711 Unilateral primary osteoarthritis, right knee: Secondary | ICD-10-CM | POA: Diagnosis not present

## 2020-06-07 DIAGNOSIS — I671 Cerebral aneurysm, nonruptured: Secondary | ICD-10-CM

## 2020-06-07 DIAGNOSIS — M19042 Primary osteoarthritis, left hand: Secondary | ICD-10-CM

## 2020-06-07 DIAGNOSIS — Z96652 Presence of left artificial knee joint: Secondary | ICD-10-CM

## 2020-06-07 DIAGNOSIS — Z8619 Personal history of other infectious and parasitic diseases: Secondary | ICD-10-CM

## 2020-06-07 DIAGNOSIS — Z8639 Personal history of other endocrine, nutritional and metabolic disease: Secondary | ICD-10-CM

## 2020-06-07 DIAGNOSIS — M722 Plantar fascial fibromatosis: Secondary | ICD-10-CM

## 2020-06-07 DIAGNOSIS — Z8669 Personal history of other diseases of the nervous system and sense organs: Secondary | ICD-10-CM

## 2020-06-07 DIAGNOSIS — M7061 Trochanteric bursitis, right hip: Secondary | ICD-10-CM

## 2020-06-07 DIAGNOSIS — Z8719 Personal history of other diseases of the digestive system: Secondary | ICD-10-CM

## 2020-06-07 DIAGNOSIS — G2581 Restless legs syndrome: Secondary | ICD-10-CM

## 2020-06-07 MED ORDER — DICLOFENAC SODIUM 1 % EX GEL: CUTANEOUS | 2 refills | Status: DC

## 2020-06-07 MED ORDER — BACLOFEN 10 MG PO TABS: 10.0000 mg | ORAL_TABLET | Freq: Every evening | ORAL | 0 refills | Status: DC | PRN

## 2020-06-07 NOTE — Patient Instructions (Signed)
Journal for Nurse Practitioners, 15(4), 263-267. Retrieved May 26, 2018 from http://clinicalkey.com/nursing">  Knee Exercises Ask your health care provider which exercises are safe for you. Do exercises exactly as told by your health care provider and adjust them as directed. It is normal to feel mild stretching, pulling, tightness, or discomfort as you do these exercises. Stop right away if you feel sudden pain or your pain gets worse. Do not begin these exercises until told by your health care provider. Stretching and range-of-motion exercises These exercises warm up your muscles and joints and improve the movement and flexibility of your knee. These exercises also help to relieve pain and swelling. Knee extension, prone 1. Lie on your abdomen (prone position) on a bed. 2. Place your left / right knee just beyond the edge of the surface so your knee is not on the bed. You can put a towel under your left / right thigh just above your kneecap for comfort. 3. Relax your leg muscles and allow gravity to straighten your knee (extension). You should feel a stretch behind your left / right knee. 4. Hold this position for __________ seconds. 5. Scoot up so your knee is supported between repetitions. Repeat __________ times. Complete this exercise __________ times a day. Knee flexion, active  1. Lie on your back with both legs straight. If this causes back discomfort, bend your left / right knee so your foot is flat on the floor. 2. Slowly slide your left / right heel back toward your buttocks. Stop when you feel a gentle stretch in the front of your knee or thigh (flexion). 3. Hold this position for __________ seconds. 4. Slowly slide your left / right heel back to the starting position. Repeat __________ times. Complete this exercise __________ times a day. Quadriceps stretch, prone  1. Lie on your abdomen on a firm surface, such as a bed or padded floor. 2. Bend your left / right knee and hold  your ankle. If you cannot reach your ankle or pant leg, loop a belt around your foot and grab the belt instead. 3. Gently pull your heel toward your buttocks. Your knee should not slide out to the side. You should feel a stretch in the front of your thigh and knee (quadriceps). 4. Hold this position for __________ seconds. Repeat __________ times. Complete this exercise __________ times a day. Hamstring, supine 1. Lie on your back (supine position). 2. Loop a belt or towel over the ball of your left / right foot. The ball of your foot is on the walking surface, right under your toes. 3. Straighten your left / right knee and slowly pull on the belt to raise your leg until you feel a gentle stretch behind your knee (hamstring). ? Do not let your knee bend while you do this. ? Keep your other leg flat on the floor. 4. Hold this position for __________ seconds. Repeat __________ times. Complete this exercise __________ times a day. Strengthening exercises These exercises build strength and endurance in your knee. Endurance is the ability to use your muscles for a long time, even after they get tired. Quadriceps, isometric This exercise stretches the muscles in front of your thigh (quadriceps) without moving your knee joint (isometric). 1. Lie on your back with your left / right leg extended and your other knee bent. Put a rolled towel or small pillow under your knee if told by your health care provider. 2. Slowly tense the muscles in the front of your left /   right thigh. You should see your kneecap slide up toward your hip or see increased dimpling just above the knee. This motion will push the back of the knee toward the floor. 3. For __________ seconds, hold the muscle as tight as you can without increasing your pain. 4. Relax the muscles slowly and completely. Repeat __________ times. Complete this exercise __________ times a day. Straight leg raises This exercise stretches the muscles in front  of your thigh (quadriceps) and the muscles that move your hips (hip flexors). 1. Lie on your back with your left / right leg extended and your other knee bent. 2. Tense the muscles in the front of your left / right thigh. You should see your kneecap slide up or see increased dimpling just above the knee. Your thigh may even shake a bit. 3. Keep these muscles tight as you raise your leg 4-6 inches (10-15 cm) off the floor. Do not let your knee bend. 4. Hold this position for __________ seconds. 5. Keep these muscles tense as you lower your leg. 6. Relax your muscles slowly and completely after each repetition. Repeat __________ times. Complete this exercise __________ times a day. Hamstring, isometric 1. Lie on your back on a firm surface. 2. Bend your left / right knee about __________ degrees. 3. Dig your left / right heel into the surface as if you are trying to pull it toward your buttocks. Tighten the muscles in the back of your thighs (hamstring) to "dig" as hard as you can without increasing any pain. 4. Hold this position for __________ seconds. 5. Release the tension gradually and allow your muscles to relax completely for __________ seconds after each repetition. Repeat __________ times. Complete this exercise __________ times a day. Hamstring curls If told by your health care provider, do this exercise while wearing ankle weights. Begin with __________ lb weights. Then increase the weight by 1 lb (0.5 kg) increments. Do not wear ankle weights that are more than __________ lb. 1. Lie on your abdomen with your legs straight. 2. Bend your left / right knee as far as you can without feeling pain. Keep your hips flat against the floor. 3. Hold this position for __________ seconds. 4. Slowly lower your leg to the starting position. Repeat __________ times. Complete this exercise __________ times a day. Squats This exercise strengthens the muscles in front of your thigh and knee  (quadriceps). 1. Stand in front of a table, with your feet and knees pointing straight ahead. You may rest your hands on the table for balance but not for support. 2. Slowly bend your knees and lower your hips like you are going to sit in a chair. ? Keep your weight over your heels, not over your toes. ? Keep your lower legs upright so they are parallel with the table legs. ? Do not let your hips go lower than your knees. ? Do not bend lower than told by your health care provider. ? If your knee pain increases, do not bend as low. 3. Hold the squat position for __________ seconds. 4. Slowly push with your legs to return to standing. Do not use your hands to pull yourself to standing. Repeat __________ times. Complete this exercise __________ times a day. Wall slides This exercise strengthens the muscles in front of your thigh and knee (quadriceps). 1. Lean your back against a smooth wall or door, and walk your feet out 18-24 inches (46-61 cm) from it. 2. Place your feet hip-width apart. 3.   Slowly slide down the wall or door until your knees bend __________ degrees. Keep your knees over your heels, not over your toes. Keep your knees in line with your hips. 4. Hold this position for __________ seconds. Repeat __________ times. Complete this exercise __________ times a day. Straight leg raises This exercise strengthens the muscles that rotate the leg at the hip and move it away from your body (hip abductors). 1. Lie on your side with your left / right leg in the top position. Lie so your head, shoulder, knee, and hip line up. You may bend your bottom knee to help you keep your balance. 2. Roll your hips slightly forward so your hips are stacked directly over each other and your left / right knee is facing forward. 3. Leading with your heel, lift your top leg 4-6 inches (10-15 cm). You should feel the muscles in your outer hip lifting. ? Do not let your foot drift forward. ? Do not let your knee  roll toward the ceiling. 4. Hold this position for __________ seconds. 5. Slowly return your leg to the starting position. 6. Let your muscles relax completely after each repetition. Repeat __________ times. Complete this exercise __________ times a day. Straight leg raises This exercise stretches the muscles that move your hips away from the front of the pelvis (hip extensors). 1. Lie on your abdomen on a firm surface. You can put a pillow under your hips if that is more comfortable. 2. Tense the muscles in your buttocks and lift your left / right leg about 4-6 inches (10-15 cm). Keep your knee straight as you lift your leg. 3. Hold this position for __________ seconds. 4. Slowly lower your leg to the starting position. 5. Let your leg relax completely after each repetition. Repeat __________ times. Complete this exercise __________ times a day. This information is not intended to replace advice given to you by your health care provider. Make sure you discuss any questions you have with your health care provider. Document Revised: 05/27/2018 Document Reviewed: 05/27/2018 Elsevier Patient Education  2020 [employer]  

## 2020-06-08 ENCOUNTER — Other Ambulatory Visit: Payer: Self-pay

## 2020-06-08 ENCOUNTER — Other Ambulatory Visit: Payer: Self-pay | Admitting: *Deleted

## 2020-06-08 MED ORDER — CARVEDILOL 12.5 MG PO TABS
12.5000 mg | ORAL_TABLET | Freq: Two times a day (BID) | ORAL | 3 refills | Status: DC
Start: 2020-06-08 — End: 2020-12-21

## 2020-06-08 MED ORDER — TRAZODONE HCL 50 MG PO TABS
25.0000 mg | ORAL_TABLET | Freq: Every evening | ORAL | 0 refills | Status: DC | PRN
Start: 2020-06-08 — End: 2020-07-21

## 2020-06-08 NOTE — Telephone Encounter (Signed)
Pt's medication was sent to pt's pharmacy as requested. Confirmation received.  °

## 2020-06-08 NOTE — Telephone Encounter (Signed)
Received fax asking Rx to be sent to mail order pharmacy for insurance reasons. done

## 2020-06-21 ENCOUNTER — Encounter: Payer: Self-pay | Admitting: Pulmonary Disease

## 2020-06-21 ENCOUNTER — Other Ambulatory Visit: Payer: Self-pay

## 2020-06-21 ENCOUNTER — Ambulatory Visit (INDEPENDENT_AMBULATORY_CARE_PROVIDER_SITE_OTHER): Payer: Medicare Other | Admitting: Pulmonary Disease

## 2020-06-21 VITALS — BP 126/68 | HR 60 | Temp 97.6°F | Ht 65.5 in | Wt 221.6 lb

## 2020-06-21 DIAGNOSIS — G4733 Obstructive sleep apnea (adult) (pediatric): Secondary | ICD-10-CM

## 2020-06-21 DIAGNOSIS — J479 Bronchiectasis, uncomplicated: Secondary | ICD-10-CM | POA: Diagnosis not present

## 2020-06-21 DIAGNOSIS — J301 Allergic rhinitis due to pollen: Secondary | ICD-10-CM

## 2020-06-21 MED ORDER — ALBUTEROL SULFATE HFA 108 (90 BASE) MCG/ACT IN AERS: 2.0000 | INHALATION_SPRAY | Freq: Four times a day (QID) | RESPIRATORY_TRACT | 3 refills | Status: DC | PRN

## 2020-06-21 NOTE — Patient Instructions (Signed)
Check with your dentist about whether you are a candidate to get an oral appliance to treat obstructive sleep apnea  Will have [employer] change your CPAP to 6 cm water pressure  Follow up in 6 months

## 2020-06-21 NOTE — Progress Notes (Signed)
[facility]  Chief Complaint  Patient presents with  . Follow-up    4 mth f/u - Tries to wear CPAP nighlty - getting a seal error - c/o dryness in mouth and nose - some increased sob on exertion and occas non prod cough    Constitutional:  BP 126/68   Pulse 60   Temp 97.6 F (36.4 C) (Oral)   Ht 5' 5.5" (1.664 m)   Wt 221 lb 9.6 oz (100.5 kg)   LMP  (LMP Unknown)   SpO2 99%   BMI 36.32 kg/m   Past Medical History:  Vit D deficiency, Takotsubo CM, Schatzki's ring, PUD, VT s/p AICD, PE, NASH, ASD, Hypothyroidism, HTN, HLD, HH, GERD, Fibromyalgia, Diverticulosis, Depression, C diff, Cerebral aneurysm, Breast cancer 2016, OA  Past Surgical History:  Her  has a past surgical history that includes Partial hysterectomy; Cerebral aneurysm repair (02/1999); Knee arthroscopy; Cardiac defibrillator placement (2006; 2012); carotid dopplers (2006); hospitalization (2004); hospitalization; ABI (2006); left heart catheterization with coronary angiogram (N/A, 02/22/2012); Breast lumpectomy with radioactive seed localization (Right, 01/03/2015); Total knee arthroplasty (Left, 02/13/2016); Breast excisional biopsy; Tongue Biopsy (12/12/2017); Colonoscopy; and Upper gastrointestinal endoscopy.  Brief Summary:  [name] is a 69 y.o. female former smoker with bronchiectasis and obstructive sleep apnea.      Subjective:   She has been struggling with CPAP.  Mask leaks and gets dry mouth.  Has full face mask.  Tried increasing humidifier setting.  Wants to look into alternative options for sleep apnea.  She has occasional cough with clear sputum.  Nebulized hypertonic saline helps some, but irritates her throat.  She uses albuterol intermittently.  Uses flutter valve at night and chest vest intermittently.  Uses mucinex daily.  Cough and congestion seem worse in the morning.  No fever or hemoptysis.  Physical Exam:   Appearance - well kempt    ENMT - no sinus tenderness, no oral exudate, no LAN, Mallampati 3 airway, no stridor  Respiratory - equal breath sounds bilaterally, no wheezing or rales  CV - s1s2 regular rate and rhythm, no murmurs  Ext - no clubbing, no edema  Skin - no rashes  Psych - normal mood and affect   Pulmonary testing:   PFT 04/17/12 >> FEV1 2.60 (110%), FEV1% 85, TLC 4.05 (77%), DLCO 44%  PFT 01/17/17 >> FEV1 2.38 (111%), FEV1% 90, TLC 3.85 (71%), DLCO 58%, no BD  RAST 05/30/17 >> dust mites  PFT 10/16/18 >> FEV1 2.27 (111%), FEV1% 90, TLC 4.00 (74%), DLCO 59%  Serology:   04/02/12 >> ACE 1  10/25/15 >> ESR 40, ACE 32  05/30/17 >> IgA, IgG, IgM normal  10/16/18 >> HP panel negative, ACE 25  Chest Imaging:   CT chest 04/10/12 >> b/l lower lobe cylindrical BTX and some in upper lobes  CT angio chest 10/26/15 >> patchy GGO in periphery of lungs b/l, basilar BTX, no PE; no significant change compared to 2013  HRCT chest 10/08/18 >> atherosclerosis, basilar predominant cylindrical BTX, mild centrilobular/paraseptal emphysema, air trapping  CT angio chest 03/24/19 >> Chronic lung changes with peribronchial thickening, bibasilar atelectasis and chronic interstitial disease/peripheral fibrosis at the lung bases  Sleep Tests:   PSG 11/29/08 >> AHI 9  HST 11/10/15 >> AHI 7.1, SaO2 low 75%  CPAP 10/10/19 to 11/08/19 >> used on 30 of 30 nights with average 5 hrs 36 min.  Average AHI 2.9 with CPAP 9 cm H2O.  Cardiac Tests:  Echo9/24/18 >> mild LVH, EF 55 to 60%, grade 1 DD, mild TR  Social History:  She  reports that she quit smoking about 30 years ago. Her smoking use included cigarettes. She has a 15.00 pack-year smoking history. She has never used smokeless tobacco. She reports current alcohol use. She reports that she does not use drugs.  Family History:  Her family history includes Allergies in her sister; Alzheimer's disease in her mother; Breast cancer in her cousin and maternal aunt; Breast  cancer (age of onset: 82) in her maternal aunt; Cancer (age of onset: 9) in her maternal grandmother; Colon cancer (age of onset: 73) in her maternal aunt; Diabetes in her mother; Heart disease in her brother, father, sister, and son; Hypertension in her mother; Lung cancer in her maternal uncle; Lung cancer (age of onset: 25) in her maternal grandfather; Obesity in her mother; Other in her brother; Pancreatic cancer (age of onset: 80) in her cousin; Prostate cancer (age of onset: 59) in her brother; Throat cancer in her brother.     Assessment/Plan:   Bronchiectasis. - continue daily mucinex - advised her to try using flutter valve in the morning to help with morning congestion, and continue using at night - continue singulair qhs - prn chest vest and albuterol - hypertonic saline causes throat irritation >> advised her to reserve this for when she has an exacerbation  Upper airway cough syndrome with allergic rhinitis. - continue singulair, allegra, flonase - she uses Navage for nasal irrigation  Obstructive sleep apnea. - she is compliant with CPAP and reports benefit - she is having trouble with mask fit and mask leak - will have [facility] change her CPAP from 9 to 6 cm H2O - discussed Inspire device >> she would not be a candidate for this - she will check with her dentist about whether she is a candidate for a mandibular advancement device  Non-ischemic cardiomyopathy. - followed by Dr. Irish Lack and Dr. Caryl Comes with [facility]  Time Spent Involved in Patient Care on Day of Examination:  51 minutes  Follow up:  Patient Instructions  Check with your dentist about whether you are a candidate to get an oral appliance to treat obstructive sleep apnea  Will have [facility] change your CPAP to 6 cm water pressure  Follow up in 6 months   Medication List:   Allergies as of 06/21/2020      Reactions   Ace Inhibitors Swelling   Angioedema; makes tongue "break out"    Amitriptyline Hcl Other (See Comments)    makes her too sleepy!   Atorvastatin Other (See Comments)   SEVERE MYALGIA   Cymbalta [duloxetine Hcl] Nausea Only, Other (See Comments)   Sleepiness/ sick   Dilantin [phenytoin Sodium Extended] Rash   Severe rash   Paroxetine Nausea Only   Rapid heartbeat   Ramipril Other (See Comments)   TONGUE ULCERS   Rosuvastatin Other (See Comments)   SEVERE MYALGIA   Carbamazepine Rash   Codeine Itching   Iodine-131 Other (See Comments)   Swelling at IV site only, no swelling or SOB around mouth.!   Phenytoin Sodium Extended Rash   Wellbutrin [bupropion] Palpitations      Medication List       Accurate as of June 21, 2020 11:31 AM. If you have any questions, ask your nurse or doctor.        STOP taking these medications   sulfamethoxazole-trimethoprim 800-160 MG tablet Commonly known as: BACTRIM DS Stopped  by: Chesley Mires, MD   zolpidem 10 MG tablet Commonly known as: AMBIEN Stopped by: Chesley Mires, MD     TAKE these medications   albuterol (2.5 MG/3ML) 0.083% nebulizer solution Commonly known as: PROVENTIL USE 1 VIAL IN NEBULIZER EVERY 6 HOURS - and as needed   albuterol 108 (90 Base) MCG/ACT inhaler Commonly known as: Ventolin HFA Inhale 2 puffs into the lungs every 6 (six) hours as needed for wheezing or shortness of breath.   aspirin 81 MG tablet Take 81 mg by mouth daily.   baclofen 10 MG tablet Commonly known as: LIORESAL Take 1 tablet (10 mg total) by mouth at bedtime as needed for muscle spasms.   BLACK CURRANT SEED OIL PO Take 1 tablet by mouth daily.   calcium carbonate 1250 (500 Ca) MG tablet Commonly known as: OS-CAL - dosed in mg of elemental calcium Take 1 tablet by mouth.   carvedilol 12.5 MG tablet Commonly known as: COREG Take 1 tablet (12.5 mg total) by mouth 2 (two) times daily.   cholecalciferol 1000 units tablet Commonly known as: VITAMIN D Take 5,000 Units by mouth daily.   diclofenac Sodium  1 % Gel Commonly known as: VOLTAREN Apply 2-4 grams to affected joint 4 times daily as needed.   fexofenadine 180 MG tablet Commonly known as: ALLEGRA Take 180 mg by mouth daily as needed for allergies or rhinitis.   fluticasone 50 MCG/ACT nasal spray Commonly known as: FLONASE Place 2 sprays into both nostrils daily as needed for allergies or rhinitis.   Flutter Devi 1 Device by Does not apply route as needed.   furosemide 40 MG tablet Commonly known as: LASIX Take 20 mg by mouth daily. 20-41m   hydroxypropyl methylcellulose / hypromellose 2.5 % ophthalmic solution Commonly known as: ISOPTO TEARS / GONIOVISC Place 1 drop into both eyes daily.   latanoprost 0.005 % ophthalmic solution Commonly known as: XALATAN Place 1 drop into both eyes at bedtime.   levothyroxine 50 MCG tablet Commonly known as: SYNTHROID Take 1 tablet (50 mcg total) by mouth daily.   magnesium oxide 400 MG tablet Commonly known as: MAG-OX Take 1 tablet (400 mg total) by mouth 2 (two) times daily.   mometasone 0.1 % lotion Commonly known as: ELOCON Apply 1 application topically daily as needed (rash).   montelukast 10 MG tablet Commonly known as: SINGULAIR Take 1 tablet (10 mg total) by mouth at bedtime.   MULTIVITAMIN PO Take 1 tablet by mouth daily.   nitroGLYCERIN 0.4 MG SL tablet Commonly known as: NITROSTAT Place 1 tablet (0.4 mg total) under the tongue every 5 (five) minutes as needed.   pantoprazole 40 MG tablet Commonly known as: PROTONIX Take 1 tablet (40 mg total) by mouth daily.   polyethylene glycol powder 17 GM/SCOOP powder Commonly known as: GLYCOLAX/MIRALAX Take 17 grams by mouth daily   potassium chloride 10 MEQ tablet Commonly known as: KLOR-CON TAKE 1/2 TABLET (5MEQ TOTAL) EVERY DAY   simvastatin 40 MG tablet Commonly known as: ZOCOR TAKE 1/2 TABLET EVERY DAY   sodium chloride HYPERTONIC 3 % nebulizer solution Take by nebulization 2 (two) times daily as needed  for cough. 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   traMADol 50 MG tablet Commonly known as: ULTRAM Take 1 tablet (50 mg total) by mouth 2 (two) times daily.   traZODone 50 MG tablet Commonly known as: DESYREL Take 0.5-1 tablets (25-50 mg total) by mouth at bedtime as needed for sleep.   vitamin C 1000 MG tablet  Take 1,000 mg by mouth daily.   zinc gluconate 50 MG tablet Take 50 mg by mouth daily.       Signature:  Chesley Mires, MD [place] Pager - [phone] 06/21/2020, 11:31 AM

## 2020-06-23 ENCOUNTER — Other Ambulatory Visit: Payer: Self-pay | Admitting: Gastroenterology

## 2020-06-23 ENCOUNTER — Other Ambulatory Visit: Payer: Self-pay

## 2020-06-23 ENCOUNTER — Encounter (INDEPENDENT_AMBULATORY_CARE_PROVIDER_SITE_OTHER): Payer: Self-pay | Admitting: Family Medicine

## 2020-06-23 ENCOUNTER — Ambulatory Visit (INDEPENDENT_AMBULATORY_CARE_PROVIDER_SITE_OTHER): Payer: Medicare Other | Admitting: Family Medicine

## 2020-06-23 VITALS — BP 127/75 | HR 85 | Temp 97.7°F | Ht 66.0 in | Wt 214.0 lb

## 2020-06-23 DIAGNOSIS — E669 Obesity, unspecified: Secondary | ICD-10-CM | POA: Diagnosis not present

## 2020-06-23 DIAGNOSIS — Z6834 Body mass index (BMI) 34.0-34.9, adult: Secondary | ICD-10-CM

## 2020-06-23 DIAGNOSIS — I10 Essential (primary) hypertension: Secondary | ICD-10-CM

## 2020-06-23 DIAGNOSIS — M797 Fibromyalgia: Secondary | ICD-10-CM

## 2020-06-23 DIAGNOSIS — G4709 Other insomnia: Secondary | ICD-10-CM | POA: Diagnosis not present

## 2020-06-23 DIAGNOSIS — R350 Frequency of micturition: Secondary | ICD-10-CM

## 2020-06-23 DIAGNOSIS — F418 Other specified anxiety disorders: Secondary | ICD-10-CM

## 2020-06-23 NOTE — Progress Notes (Signed)
Chief Complaint:   OBESITY [name] is here to discuss her progress with her obesity treatment plan along with follow-up of her obesity related diagnoses.   Today's visit was #: 13 Starting weight: 223 lbs Starting date: 10/01/2019 Today's weight: 214 lbs Today's date: 06/23/2020 Total lbs lost to date: 9 lbs Body mass index is 34.54 kg/m.  Total weight loss percentage to date: -4.04%  Interim History: [name] says she has been having more frequent urination.  She has also been under more stress.  Her sleep has improved and she is feeling better.  She has been working around the house.  Nutrition Plan: keeping a food journal and adhering to recommended goals of 1200 calories and 95 grams of protein, >25 grams of fiber, and <2500 mg of sodium for 0% of the time.  Activity: None. Sleep is restful.  Stress: Increased.  Assessment/Plan:   1. Other insomnia Improved.   She is taking trazodone 25-50 mg at bedtime as needed for sleep.  Plan: Recommend sleep hygiene measures including regular sleep schedule, optimal sleep environment, and relaxing presleep rituals.   2. Essential hypertension At goal. Medications: Coreg. Plan: Avoid buying foods that are: processed, frozen, or prepackaged to avoid excess salt. We will continue to monitor symptoms as they relate to her weight loss journey.  BP Readings from Last 3 Encounters:  06/23/20 127/75  06/21/20 126/68  06/07/20 117/78   Lab Results  Component Value Date   CREATININE 0.93 02/16/2020   3. Fibromyalgia Intensive lifestyle modifications are the first line treatment for this issue. We discussed several lifestyle modifications today and she will continue to work on diet, exercise and weight loss efforts. We will continue to monitor.   4. Urinary frequency [name] says she has been having urinary frequency.  Plan:  Will check UA and culture today.  - UA/M w/rflx Culture, Routine  5. Situational anxiety Behavior modification  techniques were discussed today to help [name] deal with her anxiety.  We will continue to monitor symptoms as they relate to her weight loss journey.  6. Class 1 obesity with serious comorbidity and body mass index (BMI) of 34.0 to 34.9 in adult, unspecified obesity type  Course: [name] is currently in the action stage of change. As such, her goal is to continue with weight loss efforts.   Nutrition goals: She has agreed to practicing portion control and making smarter food choices, such as increasing vegetables and decreasing simple carbohydrates.   Exercise goals: For substantial health benefits, adults should do at least 150 minutes (2 hours and 30 minutes) a week of moderate-intensity, or 75 minutes (1 hour and 15 minutes) a week of vigorous-intensity aerobic physical activity, or an equivalent combination of moderate- and vigorous-intensity aerobic activity. Aerobic activity should be performed in episodes of at least 10 minutes, and preferably, it should be spread throughout the week.  Behavioral modification strategies: increasing lean protein intake, decreasing simple carbohydrates, increasing vegetables and increasing water intake.  [name] has agreed to follow-up with our clinic in 3-4 weeks. She was informed of the importance of frequent follow-up visits to maximize her success with intensive lifestyle modifications for her multiple health conditions.   Objective:   Blood pressure 127/75, pulse 85, temperature 97.7 F (36.5 C), temperature source Oral, height 5\' 6"  (1.676 m), weight 214 lb (97.1 kg), SpO2 99 %. Body mass index is 34.54 kg/m.  General: Cooperative, alert, well developed, in no acute distress. HEENT: Conjunctivae and lids unremarkable. Cardiovascular: Regular  rhythm.  Lungs: Normal work of breathing. Neurologic: No focal deficits.   Lab Results  Component Value Date   CREATININE 0.93 02/16/2020   BUN 21 02/16/2020   NA 138 02/16/2020   K 4.2 02/16/2020   CL 104  02/16/2020   CO2 28 02/16/2020   Lab Results  Component Value Date   ALT 41 (H) 12/07/2019   AST 37 12/07/2019   ALKPHOS 102 12/07/2019   BILITOT 0.5 12/07/2019   Lab Results  Component Value Date   HGBA1C 5.3 12/07/2019   HGBA1C 5.7 (H) 10/01/2019   HGBA1C 5.4 12/04/2018   HGBA1C 5.6 11/27/2017   HGBA1C 5.6 04/12/2017   Lab Results  Component Value Date   INSULIN 15.6 10/01/2019   Lab Results  Component Value Date   TSH 1.86 02/16/2020   Lab Results  Component Value Date   CHOL 147 12/07/2019   HDL 46.60 12/07/2019   LDLCALC 77 12/07/2019   LDLDIRECT 141.8 07/04/2007   TRIG 117.0 12/07/2019   CHOLHDL 3 12/07/2019   Lab Results  Component Value Date   WBC 3.5 (L) 12/07/2019   HGB 12.8 12/07/2019   HCT 39.0 12/07/2019   MCV 91.4 12/07/2019   PLT 178.0 12/07/2019   Lab Results  Component Value Date   IRON 89 10/01/2019   TIBC 335 10/01/2019   FERRITIN 178 (H) 10/01/2019   Obesity Behavioral Intervention:   Approximately 15 minutes were spent on the discussion below.  ASK: We discussed the diagnosis of obesity with [name] today and [name] agreed to give [place] permission to discuss obesity behavioral modification therapy today.  ASSESS: Hailey has the diagnosis of obesity and her BMI today is 34.5. Kerstie is in the action stage of change.   ADVISE: [name] was educated on the multiple health risks of obesity as well as the benefit of weight loss to improve her health. She was advised of the need for long term treatment and the importance of lifestyle modifications to improve her current health and to decrease her risk of future health problems.  AGREE: Multiple dietary modification options and treatment options were discussed and [name] agreed to follow the recommendations documented in the above note.  ARRANGE: [name] was educated on the importance of frequent visits to treat obesity as outlined per CMS and USPSTF guidelines and agreed to schedule her next follow  up appointment today.  Attestation Statements:   Reviewed by clinician on day of visit: allergies, medications, problem list, medical history, surgical history, family history, social history, and previous encounter notes.  I, Water quality scientist, CMA, am acting as transcriptionist for Briscoe Deutscher, DO  I have reviewed the above documentation for accuracy and completeness, and I agree with the above. Briscoe Deutscher, DO

## 2020-06-26 LAB — UA/M W/RFLX CULTURE, ROUTINE
Bilirubin, UA: NEGATIVE
Glucose, UA: NEGATIVE
Leukocytes,UA: NEGATIVE
Nitrite, UA: NEGATIVE
RBC, UA: NEGATIVE
Specific Gravity, UA: 1.029 (ref 1.005–1.030)
Urobilinogen, Ur: 1 mg/dL (ref 0.2–1.0)
pH, UA: 5.5 (ref 5.0–7.5)

## 2020-06-26 LAB — MICROSCOPIC EXAMINATION
Casts: NONE SEEN /lpf
Epithelial Cells (non renal): >10 /hpf — AB (ref 0–10)

## 2020-06-26 LAB — URINE CULTURE, REFLEX

## 2020-06-27 NOTE — Progress Notes (Signed)
Pt seen results on my chart.

## 2020-07-01 ENCOUNTER — Encounter: Payer: Self-pay | Admitting: Physical Medicine and Rehabilitation

## 2020-07-01 ENCOUNTER — Other Ambulatory Visit: Payer: Self-pay

## 2020-07-01 ENCOUNTER — Encounter
Payer: Medicare Other | Attending: Physical Medicine and Rehabilitation | Admitting: Physical Medicine and Rehabilitation

## 2020-07-01 VITALS — BP 122/73 | HR 59 | Temp 97.8°F | Ht 60.0 in | Wt 221.2 lb

## 2020-07-01 DIAGNOSIS — G8929 Other chronic pain: Secondary | ICD-10-CM | POA: Diagnosis present

## 2020-07-01 DIAGNOSIS — M25851 Other specified joint disorders, right hip: Secondary | ICD-10-CM | POA: Diagnosis present

## 2020-07-01 DIAGNOSIS — M7521 Bicipital tendinitis, right shoulder: Secondary | ICD-10-CM

## 2020-07-01 DIAGNOSIS — M25562 Pain in left knee: Secondary | ICD-10-CM | POA: Diagnosis present

## 2020-07-01 DIAGNOSIS — M25561 Pain in right knee: Secondary | ICD-10-CM | POA: Diagnosis present

## 2020-07-01 DIAGNOSIS — M797 Fibromyalgia: Secondary | ICD-10-CM | POA: Diagnosis present

## 2020-07-01 DIAGNOSIS — M7918 Myalgia, other site: Secondary | ICD-10-CM

## 2020-07-01 MED ORDER — TRAMADOL HCL 50 MG PO TABS: 50.0000 mg | ORAL_TABLET | Freq: Two times a day (BID) | ORAL | 5 refills | Status: DC

## 2020-07-01 NOTE — Patient Instructions (Signed)
Plan: 1.  steroid injection was performed  At R trochanteric bursa steroid injection and R knee- lateral aspect of distal patella using 1% plain Lidocaine and 40mg  /1cc of Kenalog. This was well tolerated.  Cleaned with betadine x3 and allowed to dry- then alcohol then injected using 27 gauge 1.5 inch needle- no bleeding or complications.    F/U in 3 months for steroid injections R knee and R trochanteric bursa steroid injection.  Lidocaine will kick in 15 minutes- and wear off tonight- the steroid will kick in tomorrow within 24 hours and take up to 72 hours to fully kick in.   2. Patient here for trigger point injections for  Consent done and on chart.  Cleaned areas with alcohol and injected using a 27 gauge 1.5 inch needle  Injected 3cc Using 1% Lidocaine with no EPI  Upper traps B/L Levators R only Posterior scalenes Middle scalenes Splenius Capitus Pectoralis Major Rhomboids B/L x2 Infraspinatus Teres Major/minor Thoracic paraspinals Lumbar paraspinals Other injections- R deltoid- anterior   Patient's level of pain prior was 7/10 Current level of pain after injections is4/10  There was no bleeding or complications.  Patient was advised to drink a lot of water on day after injections to flush system Will have increased soreness for 12-48 hours after injections.  Can use Lidocaine patches the day AFTER injections Can use theracane on day of injections in places didn't inject Can use heating pad 4-6 hours AFTER injections   3. Has refill on tramadol but ran out due to time- so will refill for 6 months   4. F/U- 3 months

## 2020-07-01 NOTE — Progress Notes (Signed)
   Patient is a 1 yr oldR handedfemale with hx of Bronchiesctasis, CHF, prediabetes- with A1c of 5.3- - not on blood thinners, has a defibrillator, GERD, and fibromyalgia issues- dx'd 10 years ago. [place] trigger point injections R trochanteric bursa injection. .   Wants R knee injected.  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   Wondering if there was some kind of injection to last months- explained it's bad.     Plan: 1.  steroid injection was performed  At R trochanteric bursa steroid injection and R knee- lateral aspect of distal patella using 1% plain Lidocaine and 40mg  /1cc of Kenalog. This was well tolerated.  Cleaned with betadine x3 and allowed to dry- then alcohol then injected using 27 gauge 1.5 inch needle- no bleeding or complications.    F/U in 3 months for steroid injections R knee and R trochanteric bursa steroid injection.  Lidocaine will kick in 15 minutes- and wear off tonight- the steroid will kick in tomorrow within 24 hours and take up to 72 hours to fully kick in.   2. Patient here for trigger point injections for  Consent done and on chart.  Cleaned areas with alcohol and injected using a 27 gauge 1.5 inch needle  Injected 3cc Using 1% Lidocaine with no EPI  Upper traps B/L Levators R only Posterior scalenes Middle scalenes Splenius Capitus Pectoralis Major Rhomboids B/L x2 Infraspinatus Teres Major/minor Thoracic paraspinals Lumbar paraspinals Other injections- R deltoid- anterior   Patient's level of pain prior was 7/10 Current level of pain after injections is4/10  There was no bleeding or complications.  Patient was advised to drink a lot of water on day after injections to flush system Will have increased soreness for 12-48 hours after injections.  Can use Lidocaine patches the day AFTER injections Can use theracane on day of injections in places didn't inject Can use heating pad 4-6 hours AFTER injections   3. Has  refill on tramadol but ran out due to time- so will refill for 6 months   4. F/U- 3 months   I spent a total of 30 minutes on visit- as detailed above.

## 2020-07-08 ENCOUNTER — Other Ambulatory Visit: Payer: Self-pay | Admitting: Physician Assistant

## 2020-07-08 NOTE — Telephone Encounter (Signed)
Last Visit: 06/07/2020 Next Visit: 12/14/2020  Last Fill: 06/07/2020  Okay to refill Baclofen?

## 2020-07-19 ENCOUNTER — Other Ambulatory Visit: Payer: Self-pay | Admitting: Internal Medicine

## 2020-07-19 ENCOUNTER — Other Ambulatory Visit: Payer: Self-pay | Admitting: Pulmonary Disease

## 2020-07-19 ENCOUNTER — Other Ambulatory Visit: Payer: Self-pay | Admitting: Gastroenterology

## 2020-07-21 ENCOUNTER — Other Ambulatory Visit: Payer: Self-pay

## 2020-07-21 ENCOUNTER — Ambulatory Visit (INDEPENDENT_AMBULATORY_CARE_PROVIDER_SITE_OTHER): Payer: Medicare Other | Admitting: Family Medicine

## 2020-07-21 ENCOUNTER — Encounter (INDEPENDENT_AMBULATORY_CARE_PROVIDER_SITE_OTHER): Payer: Self-pay | Admitting: Family Medicine

## 2020-07-21 VITALS — BP 124/71 | HR 60 | Temp 97.7°F | Ht 66.0 in | Wt 214.0 lb

## 2020-07-21 DIAGNOSIS — E7849 Other hyperlipidemia: Secondary | ICD-10-CM

## 2020-07-21 DIAGNOSIS — E669 Obesity, unspecified: Secondary | ICD-10-CM

## 2020-07-21 DIAGNOSIS — E038 Other specified hypothyroidism: Secondary | ICD-10-CM | POA: Diagnosis not present

## 2020-07-21 DIAGNOSIS — G4709 Other insomnia: Secondary | ICD-10-CM

## 2020-07-21 DIAGNOSIS — Z6834 Body mass index (BMI) 34.0-34.9, adult: Secondary | ICD-10-CM | POA: Diagnosis not present

## 2020-07-21 MED ORDER — TRAZODONE HCL 100 MG PO TABS: 100.0000 mg | ORAL_TABLET | Freq: Every evening | ORAL | 0 refills | Status: DC | PRN

## 2020-07-27 NOTE — Progress Notes (Signed)
Chief Complaint:   OBESITY [name] is here to discuss her progress with her obesity treatment plan along with follow-up of her obesity related diagnoses.   Today's visit was #: 14 Starting weight: 223 lbs Starting date: 10/01/2019 Today's weight: 214 lbs Today's date: 07/21/2020 Total lbs lost to date: 9 lbs Body mass index is 34.54 kg/m.  Total weight loss percentage to date: -4.04%  Interim History: [name] says she has been getting less sleep.  Her urinary symptoms have resolved. Nutrition Plan: practicing portion control and making smarter food choices, such as increasing vegetables and decreasing simple carbohydrates for 0% of the time.  Anti-obesity medications: None.  Hunger is well controlled. Cravings are well controlled.  Activity: None at this time. Sleep: Poor.  Assessment/Plan:   1. Other insomnia This is poorly controlled.   Plan: Recommend sleep hygiene measures including regular sleep schedule, optimal sleep environment, and relaxing presleep rituals. After discussion, patient would like to start below medication. Expectations, risks, and potential side effects reviewed.   -Start traZODone (DESYREL) 100 MG tablet; Take 1 tablet (100 mg total) by mouth at bedtime as needed for sleep.  Dispense: 30 tablet; Refill: 0  2. Other specified hypothyroidism Course: Controlled. Medication: levothyroxine 50 mcg daily.   Lab Results  Component Value Date   TSH 1.86 02/16/2020   Plan: Patient was instructed not to take MVM or iron within 4 hours of taking thyroid medications. We will continue to monitor symptoms as they relate to her weight loss journey.  3. Other hyperlipidemia Course: Stable.  Lipid-lowering medications: Zocor 40 mg daily.   Plan: Dietary changes: Increase soluble fiber. Decrease simple carbohydrates. Exercise changes: An average 40 minutes of moderate to vigorous-intensity aerobic activity 3 or 4 times per week.   Lab Results  Component Value  Date   CHOL 147 12/07/2019   HDL 46.60 12/07/2019   LDLCALC 77 12/07/2019   LDLDIRECT 141.8 07/04/2007   TRIG 117.0 12/07/2019   CHOLHDL 3 12/07/2019   Lab Results  Component Value Date   ALT 41 (H) 12/07/2019   AST 37 12/07/2019   ALKPHOS 102 12/07/2019   BILITOT 0.5 12/07/2019   The 10-year ASCVD risk score [name] DC Jr., et al., 2013) is: 8.8%   Values used to calculate the score:     Age: 69 years     Sex: Female     Is Non-Hispanic African American: Yes     Diabetic: No     Tobacco smoker: No     Systolic Blood Pressure: 275 mmHg     Is BP treated: Yes     HDL Cholesterol: 46.6 mg/dL     Total Cholesterol: 147 mg/dL  4. Class 1 obesity with serious comorbidity and body mass index (BMI) of 34.0 to 34.9 in adult, unspecified obesity type  Course: [name] is currently in the action stage of change. As such, her goal is to continue with weight loss efforts.   Nutrition goals: She has agreed to keeping a food journal and adhering to recommended goals of 1200 calories and 95 grams of protein.   Exercise goals: For substantial health benefits, adults should do at least 150 minutes (2 hours and 30 minutes) a week of moderate-intensity, or 75 minutes (1 hour and 15 minutes) a week of vigorous-intensity aerobic physical activity, or an equivalent combination of moderate- and vigorous-intensity aerobic activity. Aerobic activity should be performed in episodes of at least 10 minutes, and preferably, it should be spread throughout  the week.  Behavioral modification strategies: increasing lean protein intake, decreasing simple carbohydrates, increasing vegetables and increasing water intake.  [name] has agreed to follow-up with our clinic in 4 weeks. She was informed of the importance of frequent follow-up visits to maximize her success with intensive lifestyle modifications for her multiple health conditions.   Objective:   Blood pressure 124/71, pulse 60, temperature 97.7 F (36.5 C),  temperature source Oral, height 5\' 6"  (1.676 m), weight 214 lb (97.1 kg), SpO2 98 %. Body mass index is 34.54 kg/m.  General: Cooperative, alert, well developed, in no acute distress. HEENT: Conjunctivae and lids unremarkable. Cardiovascular: Regular rhythm.  Lungs: Normal work of breathing. Neurologic: No focal deficits.   Lab Results  Component Value Date   CREATININE 0.93 02/16/2020   BUN 21 02/16/2020   NA 138 02/16/2020   K 4.2 02/16/2020   CL 104 02/16/2020   CO2 28 02/16/2020   Lab Results  Component Value Date   ALT 41 (H) 12/07/2019   AST 37 12/07/2019   ALKPHOS 102 12/07/2019   BILITOT 0.5 12/07/2019   Lab Results  Component Value Date   HGBA1C 5.3 12/07/2019   HGBA1C 5.7 (H) 10/01/2019   HGBA1C 5.4 12/04/2018   HGBA1C 5.6 11/27/2017   HGBA1C 5.6 04/12/2017   Lab Results  Component Value Date   INSULIN 15.6 10/01/2019   Lab Results  Component Value Date   TSH 1.86 02/16/2020   Lab Results  Component Value Date   CHOL 147 12/07/2019   HDL 46.60 12/07/2019   LDLCALC 77 12/07/2019   LDLDIRECT 141.8 07/04/2007   TRIG 117.0 12/07/2019   CHOLHDL 3 12/07/2019   Lab Results  Component Value Date   WBC 3.5 (L) 12/07/2019   HGB 12.8 12/07/2019   HCT 39.0 12/07/2019   MCV 91.4 12/07/2019   PLT 178.0 12/07/2019   Lab Results  Component Value Date   IRON 89 10/01/2019   TIBC 335 10/01/2019   FERRITIN 178 (H) 10/01/2019   Obesity Behavioral Intervention:   Approximately 15 minutes were spent on the discussion below.  ASK: We discussed the diagnosis of obesity with [name] today and [name] agreed to give [place] permission to discuss obesity behavioral modification therapy today.  ASSESS: [name] has the diagnosis of obesity and her BMI today is 34.7. [name] is in the action stage of change.   ADVISE: [name] was educated on the multiple health risks of obesity as well as the benefit of weight loss to improve her health. She was advised of the need for long  term treatment and the importance of lifestyle modifications to improve her current health and to decrease her risk of future health problems.  AGREE: Multiple dietary modification options and treatment options were discussed and [name] agreed to follow the recommendations documented in the above note.  ARRANGE: [name] was educated on the importance of frequent visits to treat obesity as outlined per CMS and USPSTF guidelines and agreed to schedule her next follow up appointment today.  Attestation Statements:   Reviewed by clinician on day of visit: allergies, medications, problem list, medical history, surgical history, family history, social history, and previous encounter notes.  I, Water quality scientist, CMA, am acting as transcriptionist for Briscoe Deutscher, DO  I have reviewed the above documentation for accuracy and completeness, and I agree with the above. Briscoe Deutscher, DO

## 2020-08-18 ENCOUNTER — Other Ambulatory Visit: Payer: Self-pay | Admitting: Physician Assistant

## 2020-08-18 NOTE — Telephone Encounter (Signed)
Last Visit: 06/07/2020 Next Visit: 12/14/2020  Last Fill: 07/08/2020  Okay to refill Baclofen?

## 2020-08-21 ENCOUNTER — Other Ambulatory Visit: Payer: Self-pay | Admitting: Family Medicine

## 2020-08-23 ENCOUNTER — Ambulatory Visit: Payer: Medicare Other | Admitting: Nurse Practitioner

## 2020-08-23 NOTE — Progress Notes (Deleted)
     08/23/2020 [name] [id number] 1950/11/21   Chief Complaint:   History of Present Illness: [name] is a 70 year old female with a past medical history of fibromyalgia, breast cancer, hypertension, ventricular tachycardia status post ICD, sleep apnea, hiatal hernia, GERD, atrophic gastritis, C. difficile infection and adenomatous colon polyps.  She was last seen in our office by Willette Cluster, NP on 05/29/2019 due to having chronic right mid abdominal pain.  Known umbilical hernia containing transverse colon on CTAP 01/2019.  She was previously evaluated by general surgeon Dr. Carolynne Edouard and she declined surgical intervention.  EGD 01/04/2016: Non-obstructing Schatzki ring. - 3 cm hiatal hernia. - Gastritis. - Normal cardia, gastric fundus and gastric body. - Normal examined duodenum. - Multiple biopsies were obtained in the gastric antrum. - Multiple biopsies were obtained in the gastric body.  1. Surgical [P], gastric antrum - BENIGN ANTRAL GASTRIC MUCOSA WITH MILD CHRONIC INFLAMMATION AND REACTIVE CHANGES. - WARTHIN-STARRY STAIN IS NEGATIVE FOR HELICOBACTER PYLORI. - NO INTESTINAL METAPLASIA, DYSPLASIA, OR MALIGNANCY. - SEE COMMENT. 2. Surgical [P], gastric body - OXYNTIC GASTRIC MUCOSA SHOWING CHRONIC ATROPHIC GASTRITIS WITH INTESTINAL METAPLASIA. - WARTHIN-STARRY STAIN IS NEGATIVE FOR HELICOBACTER PYLORI. - NO DYSPLASIA OR MALIGNANCY IDENTIFIED. -3 year recall EGD  Colonoscopy 01/06/2018: - Two 2 to 3 mm polyps in the ascending colon, removed with a cold biopsy forceps. Resected and retrieved. - One 5 mm polyp in the transverse colon, removed with a cold snare. Resected and retrieved. - Severe diverticulosis from ascending colon to sigmoid colon. - The distal rectum and anal verge are normal on retroflexion view. - 5 year recall colonoscopy Surgical [P], ascending, transverse, polyp (3) - TUBULAR ADENOMA (X2 FRAGMENTS). - NO HIGH GRADE  DYSPLASIA OR MALIGNANCY.   Current Medications, Allergies, Past Medical History, Past Surgical History, Family History and Social History were reviewed in [employer] record.   Review of Systems:   Constitutional: Negative for fever, sweats, chills or weight loss.  Respiratory: Negative for shortness of breath.   Cardiovascular: Negative for chest pain, palpitations and leg swelling.  Gastrointestinal: See HPI.  Musculoskeletal: Negative for back pain or muscle aches.  Neurological: Negative for dizziness, headaches or paresthesias.    Physical Exam: LMP  (LMP Unknown)  General: Well developed, w   ***female in no acute distress. Head: Normocephalic and atraumatic. Eyes: No scleral icterus. Conjunctiva pink . Ears: Normal auditory acuity. Mouth: Dentition intact. No ulcers or lesions.  Lungs: Clear throughout to auscultation. Heart: Regular rate and rhythm, no murmur. Abdomen: Soft, nontender and nondistended. No masses or hepatomegaly. Normal bowel sounds x 4 quadrants.  Rectal: *** Musculoskeletal: Symmetrical with no gross deformities. Extremities: No edema. Neurological: Alert oriented x 4. No focal deficits.  Psychological: Alert and cooperative. Normal mood and affect  Assessment and Recommendations: ***

## 2020-08-23 NOTE — Telephone Encounter (Signed)
[facility] management ctr refilled Rx last on 07/21/20 #30 with 0 refills, last OV was an acute appt on 05/05/20

## 2020-08-24 ENCOUNTER — Telehealth: Payer: Self-pay

## 2020-08-24 NOTE — Telephone Encounter (Signed)
Ok with me 

## 2020-08-24 NOTE — Telephone Encounter (Signed)
Dr. Earlene Plater from [facility] put in a referral for pt to transfer care from Dr. Milinda Antis to Jarold Motto at [facility]. Pt called to schedule appt. Ok for transfer? Please advise.

## 2020-08-24 NOTE — Telephone Encounter (Signed)
That is fine, thanks 

## 2020-08-25 ENCOUNTER — Ambulatory Visit (INDEPENDENT_AMBULATORY_CARE_PROVIDER_SITE_OTHER): Payer: Medicare Other | Admitting: Family Medicine

## 2020-08-25 ENCOUNTER — Other Ambulatory Visit: Payer: Self-pay

## 2020-08-25 ENCOUNTER — Encounter (INDEPENDENT_AMBULATORY_CARE_PROVIDER_SITE_OTHER): Payer: Self-pay | Admitting: Family Medicine

## 2020-08-25 VITALS — BP 104/67 | HR 63 | Temp 97.9°F | Ht 66.0 in | Wt 218.0 lb

## 2020-08-25 DIAGNOSIS — E8881 Metabolic syndrome: Secondary | ICD-10-CM

## 2020-08-25 DIAGNOSIS — Z6835 Body mass index (BMI) 35.0-35.9, adult: Secondary | ICD-10-CM

## 2020-08-25 DIAGNOSIS — E65 Localized adiposity: Secondary | ICD-10-CM | POA: Diagnosis not present

## 2020-08-25 DIAGNOSIS — E66812 Obesity, class 2: Secondary | ICD-10-CM

## 2020-08-25 DIAGNOSIS — E038 Other specified hypothyroidism: Secondary | ICD-10-CM

## 2020-08-25 DIAGNOSIS — G4709 Other insomnia: Secondary | ICD-10-CM

## 2020-08-26 ENCOUNTER — Encounter: Payer: Self-pay | Admitting: Physician Assistant

## 2020-08-26 ENCOUNTER — Ambulatory Visit (INDEPENDENT_AMBULATORY_CARE_PROVIDER_SITE_OTHER): Payer: Medicare Other | Admitting: Physician Assistant

## 2020-08-26 ENCOUNTER — Other Ambulatory Visit: Payer: Self-pay | Admitting: Physician Assistant

## 2020-08-26 VITALS — BP 126/70 | HR 58 | Temp 97.3°F | Ht 66.0 in | Wt 223.2 lb

## 2020-08-26 DIAGNOSIS — E038 Other specified hypothyroidism: Secondary | ICD-10-CM | POA: Diagnosis not present

## 2020-08-26 DIAGNOSIS — G4700 Insomnia, unspecified: Secondary | ICD-10-CM

## 2020-08-26 LAB — TSH: TSH: 3.64 u[IU]/mL (ref 0.35–4.50)

## 2020-08-26 NOTE — Patient Instructions (Addendum)
It was great to see you!  Sleep Hygiene  Do: (1) Go to bed at the same time each day. (2) Get up from bed at the same time each day. (3) Get regular exercise each day, preferably in the morning.  There is goof evidence that regular exercise improves restful sleep.  This includes stretching and aerobic exercise. (4) Get regular exposure to outdoor or bright lights, especially in the late afternoon. (5) Keep the temperature in your bedroom comfortable. (6) Keep the bedroom quiet when sleeping. (7) Keep the bedroom dark enough to facilitate sleep. (8) Use your bed only for sleep and sex. (9) Take medications as directed.  It is helpful to take prescribed sleeping pills 1 hour before bedtime, so they are causing drowsiness when you lie down, or 10 hours before getting up, to avoid daytime drowsiness. (10) Use a relaxation exercise just before going to sleep -- imagery, massage, warm bath. (11) Keep your feet and hands warm.  Wear warm socks and/or mittens or gloves to bed.  Don't: (1) Exercise just before going to bed. (2) Engage in stimulating activity just before bed, such as playing a competitive game, watching an exciting program on television, or having an important discussion with a loved one. (3) Have caffeine in the evening (coffee, teas, chocolate, sodas, etc.) (4) Read or watch television in bed. (5) Use alcohol to help you sleep. (6) Go to bed too hungry or too full. (7) Take another person's sleeping pills. (8) Take over-the-counter sleeping pills, without your doctor's knowledge.  Tolerance can develop rapidly with these medications.  Diphenhydramine can have serious side effects for elderly patients. (9) Take daytime naps. (10) Command yourself to go to sleep.  This only makes your mind and body more alert.  If you lie awake for more than 20-30 minutes, get up, go to a different room, participate in a quiet activity (Ex - non-excitable reading or television), and then return to  bed when you feel sleepy.  Do this as many times during the night as needed.  This may cause you to have a night or two of poor sleep but it will train your brain to know when it is time for sleep.   Update your thyroid today and I will be in touch with your results.   Inda Coke PA-C

## 2020-08-26 NOTE — Progress Notes (Signed)
[name] is a 70 y.o. female is here for transfer of care.  I acted as a Education administrator for [employer], PA-C Anselmo Pickler, LPN   History of Present Illness:   Chief Complaint  Patient presents with  . Transfer of care  . Insomnia    HPI   Pt is here for transfer of care today from Dr. Glori Bickers.  Insomnia She has been taking ambien for several years up until a few months ago. She wanted to stop it because she felt like it caused increase eating and weight gain. PCP switched to 25-50 mg trazodone. Two weeks ago she was increased to 100 mg trazodone. She feels like she is having improved symptoms however she is still having night time awakenings.  We reviewed her sleep regimen: Gets in the bed at 11pm, takes her nighttime medications (trazodone, magnesium, tramadol, baclofen, singulair, zocor), does her flutter valve and uses eye drops. She watches TV and looks at her phone until she falls asleep around 12-12:30p Will wake up an hour or so later to use the bathroom, has difficulty falling asleep.  Wakes up at 8am but stays in bed to watch her morning TV shows until 10-11a.  She does admit that she feels happier/calmer with trazodone than she did with ambien.  Uses CPAP regularly but recently having increased dry mucous membranes, despite using nasal sprays, mouth washes, etc. She is working with her dentist, per pulm rec, for mouth appliance to possibly help with this.    Hypothyroidism Takes thyroid medication, 50 mcg levothyroxine, daily with other medications around 10 am. Last thyroid level checked about 6 months ago and was WNL.    There are no preventive care reminders to display for this patient.  Past Medical History:  Diagnosis Date  . AICD (automatic cardioverter/defibrillator) present   . Anxiety   . Arthritis   . Back pain   . Breast cancer ([place]) 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  . Bronchiectasis ([place])   . Cerebral aneurysm,  nonruptured    had a clip put in  . CHF (congestive heart failure) ([place])   . Clostridium difficile infection   . Depressive disorder, not elsewhere classified   . Diverticulosis of colon (without mention of hemorrhage)   . Esophageal reflux   . Fatty liver   . Fibromyalgia   . Gastritis   . GERD (gastroesophageal reflux disease)   . GI bleed 2004  . Glaucoma   . Hiatal hernia   . Hyperlipidemia   . Hypertension   . Hypothyroidism   . Internal hemorrhoids   . Joint pain   . Obstructive sleep apnea (adult) (pediatric)   . Osteoarthritis   . Ostium secundum type atrial septal defect   . Other chronic nonalcoholic liver disease   . Other pulmonary embolism and infarction   . Palpitations   . Paroxysmal ventricular tachycardia ([place])   . Personal history of radiation therapy   . Pneumonia    history  . Presence of permanent cardiac pacemaker   . PUD (peptic ulcer disease)   . Radiation 02/03/15-03/10/15   Right Breast  . Sarcoid    per pt , not sure  . Schatzki's ring   . Shortness of breath   . Sleep apnea   . Stroke [facility]) 2013   tia/ pt feels it was around 2008 0r 2009  . Takotsubo syndrome   . Tubular adenoma of colon   . Unspecified transient cerebral ischemia   .  Unspecified vitamin D deficiency      Social History   Tobacco Use  . Smoking status: Former Smoker    Packs/day: 1.00    Years: 15.00    Pack years: 15.00    Types: Cigarettes    Quit date: 08/20/1989    Years since quitting: 31.0  . Smokeless tobacco: Never Used  . Tobacco comment: Started at 22; less than 1 PPD  Vaping Use  . Vaping Use: Never used  Substance Use Topics  . Alcohol use: Yes    Alcohol/week: 0.0 standard drinks    Comment: rare  . Drug use: No    Past Surgical History:  Procedure Laterality Date  . ABI  2006   normal  . BREAST EXCISIONAL BIOPSY    . BREAST LUMPECTOMY WITH RADIOACTIVE SEED LOCALIZATION Right 01/03/2015   Procedure: BREAST LUMPECTOMY WITH RADIOACTIVE SEED  LOCALIZATION;  Surgeon: Autumn Messing III, MD;  Location: [place];  Service: General;  Laterality: Right;  . CARDIAC DEFIBRILLATOR PLACEMENT  2006; 2012   BSX single chamber ICD  . carotid dopplers  2006   neg  . CEREBRAL ANEURYSM REPAIR  02/1999  . COLONOSCOPY    . hospitalization  2004   GI bleed, PUD, diverticulosis (EGD,colonscopy)  . hospitalization     PE, NSVT, s/p defib  . KNEE ARTHROSCOPY     bilateral  . LEFT HEART CATHETERIZATION WITH CORONARY ANGIOGRAM N/A 02/22/2012   Procedure: LEFT HEART CATHETERIZATION WITH CORONARY ANGIOGRAM;  Surgeon: Burnell Blanks, MD;  Location: [facility] CATH LAB;  Service: Cardiovascular;  Laterality: N/A;  . PARTIAL HYSTERECTOMY     Fibroids  . TONGUE BIOPSY  12/12/2017   due to sore tongue and white patches/abnormal cells  . TOTAL KNEE ARTHROPLASTY Left 02/13/2016   Procedure: TOTAL KNEE ARTHROPLASTY;  Surgeon: Vickey Huger, MD;  Location: [place];  Service: Orthopedics;  Laterality: Left;  . UPPER GASTROINTESTINAL ENDOSCOPY      Family History  Problem Relation Age of Onset  . Diabetes Mother   . Alzheimer's disease Mother   . Hypertension Mother   . Obesity Mother   . Other Brother        Thyroid problem 10/2016  . Allergies Sister   . Heart disease Sister   . Heart disease Brother   . Prostate cancer Brother 42       same brother as throat cancer  . Throat cancer Brother        dx in his 82s; also a smoker  . Heart disease Father   . Cancer Maternal Grandmother 54       colon cancer or abdominal cancer  . Lung cancer Maternal Grandfather 60  . Breast cancer Maternal Aunt 75  . Colon cancer Maternal Aunt 8       same sister as breast at 22  . Lung cancer Maternal Uncle   . Breast cancer Maternal Aunt        dx in her 66s  . Pancreatic cancer Cousin 50       maternal first cousin  . Breast cancer Cousin        paternal first cousin twice removed died in her 52s  . Heart disease Son        Cardiac Arrest 07/2016  . Stroke Neg Hx      PMHx, SurgHx, SocialHx, FamHx, Medications, and Allergies were reviewed in the Visit Navigator and updated as appropriate.   Patient Active Problem List   Diagnosis Date Noted  .  Tailbone injury 05/05/2020  . UTI (urinary tract infection) 05/05/2020  . Insomnia 05/05/2020  . Leg cramps 02/16/2020  . Dysuria 02/16/2020  . Right hip impingement syndrome 02/01/2020  . Right bicipital tenosynovitis 02/01/2020  . Bronchiectasis without complication ([place]) 0000000  . Myofascial pain 12/21/2019  . Estrogen deficiency 12/10/2019  . Obesity (BMI 30-39.9) 12/10/2019  . Bronchiectasis with (acute) exacerbation ([place]) 10/23/2019  . Elevated LFTs 10/15/2019  . Lower abdominal pain 09/01/2019  . Neck pain 10/25/2017  . Paresthesias in left hand 10/25/2017  . Paresthesia of foot, bilateral 04/12/2017  . Primary osteoarthritis of both hands 09/27/2016  . Primary osteoarthritis of both knees 09/27/2016  . 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  . Nasopharyngitis, 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/2016  . S/P total knee replacement 02/13/2016  . Chronic pain of both knees 12/06/2015  . Globus pharyngeus 05/17/2015  . Genetic testing 12/23/2014  . Family history of breast cancer   . Family history of colon cancer   . Family history of pancreatic cancer   . History of breast cancer 11/23/2014  . Allergy to ACE inhibitors 09/27/2014  . Prediabetes 06/23/2013  . RLS (restless legs syndrome) 01/15/2013  . Hx of Clostridium difficile infection 10/10/2012  . Dyspnea on exertion 04/02/2012  . NICM (nonischemic cardiomyopathy) ([place]) 03/07/2012  . Post-menopausal 01/11/2012  . Obstructive sleep apnea 04/04/2010  . V-tach ([place]) 01/03/2009  . ICD  [employer]  Single chamber 01/03/2009  . PFO (patent foramen ovale) 09/29/2008  . Vitamin D deficiency 08/17/2008  . Hypothyroidism 11/18/2006  . Hyperlipidemia 11/18/2006  . Depression with anxiety 11/18/2006  . Essential hypertension 11/18/2006  . Mitral  valve prolapse 11/18/2006  . Cerebral aneurysm 11/18/2006  . Allergic rhinitis 11/18/2006  . GERD 11/18/2006  . Diverticulosis of colon 11/18/2006  . Fatty liver 11/18/2006  . Fibromyalgia 11/18/2006    Social History   Tobacco Use  . Smoking status: Former Smoker    Packs/day: 1.00    Years: 15.00    Pack years: 15.00    Types: Cigarettes    Quit date: 08/20/1989    Years since quitting: 31.0  . Smokeless tobacco: Never Used  . Tobacco comment: Started at 68; less than 1 PPD  Vaping Use  . Vaping Use: Never used  Substance Use Topics  . Alcohol use: Yes    Alcohol/week: 0.0 standard drinks    Comment: rare  . Drug use: No    Current Medications and Allergies:    Current Outpatient Medications:  .  albuterol (PROVENTIL) (2.5 MG/3ML) 0.083% nebulizer solution, USE 1 VIAL IN NEBULIZER EVERY 6 HOURS - and as needed, Disp: 75 mL, Rfl: 11 .  albuterol (VENTOLIN HFA) 108 (90 Base) MCG/ACT inhaler, Inhale 2 puffs into the lungs every 6 (six) hours as needed for wheezing or shortness of breath., Disp: 18 g, Rfl: 3 .  Ascorbic Acid (VITAMIN C) 1000 MG tablet, Take 1,000 mg by mouth daily., Disp: , Rfl:  .  aspirin 81 MG tablet, Take 81 mg by mouth daily., Disp: , Rfl:  .  baclofen (LIORESAL) 10 MG tablet, TAKE 1 TABLET BY MOUTH AT BEDTIME AS NEEDED FOR MUSCLE SPASM, Disp: 30 tablet, Rfl: 0 .  BLACK CURRANT SEED OIL PO, Take 1 tablet by mouth daily., Disp: , Rfl:  .  calcium carbonate (OS-CAL - DOSED IN MG OF ELEMENTAL CALCIUM) 1250 (500 Ca) MG tablet, Take 1 tablet by mouth., Disp: , Rfl:  .  carvedilol (COREG) 12.5 MG tablet, Take 1 tablet (12.5  mg total) by mouth 2 (two) times daily., Disp: 180 tablet, Rfl: 3 .  cholecalciferol (VITAMIN D) 1000 UNITS tablet, Take 5,000 Units by mouth daily. , Disp: , Rfl:  .  diclofenac Sodium (VOLTAREN) 1 % GEL, Apply 2-4 grams to affected joint 4 times daily as needed., Disp: 400 g, Rfl: 2 .  fexofenadine (ALLEGRA) 180 MG tablet, Take 180 mg by  mouth daily as needed for allergies or rhinitis., Disp: , Rfl:  .  fluticasone (FLONASE) 50 MCG/ACT nasal spray, Place 2 sprays into both nostrils daily as needed for allergies or rhinitis., Disp: 48 g, Rfl: 3 .  furosemide (LASIX) 40 MG tablet, Take 20 mg by mouth daily. 20-40mg , Disp: , Rfl:  .  hydroxypropyl methylcellulose / hypromellose (ISOPTO TEARS / GONIOVISC) 2.5 % ophthalmic solution, Place 1 drop into both eyes daily., Disp: , Rfl:  .  latanoprost (XALATAN) 0.005 % ophthalmic solution, Place 1 drop into both eyes at bedtime., Disp: , Rfl:  .  levothyroxine (SYNTHROID) 50 MCG tablet, Take 1 tablet (50 mcg total) by mouth daily., Disp: 90 tablet, Rfl: 3 .  magnesium oxide (MAG-OX) 400 MG tablet, Take 1 tablet (400 mg total) by mouth 2 (two) times daily., Disp: 60 tablet, Rfl: 11 .  mometasone (ELOCON) 0.1 % lotion, Apply 1 application topically daily as needed (rash). , Disp: , Rfl:  .  montelukast (SINGULAIR) 10 MG tablet, TAKE 1 TABLET AT BEDTIME, Disp: 90 tablet, Rfl: 3 .  Multiple Vitamins-Minerals (MULTIVITAMIN PO), Take 1 tablet by mouth daily., Disp: , Rfl:  .  pantoprazole (PROTONIX) 40 MG tablet, Take 1 tablet (40 mg total) by mouth daily. Please schedule an office visit for further refills. Thank you, Disp: 30 tablet, Rfl: 1 .  polyethylene glycol powder (GLYCOLAX/MIRALAX) powder, Take 17 grams by mouth daily, Disp: 1080 g, Rfl: 0 .  potassium chloride (KLOR-CON) 10 MEQ tablet, TAKE 1/2 TABLET EVERY DAY, Disp: 45 tablet, Rfl: 3 .  Respiratory Therapy Supplies (FLUTTER) DEVI, 1 Device by Does not apply route as needed., Disp: 1 each, Rfl: 0 .  simvastatin (ZOCOR) 40 MG tablet, TAKE 1/2 TABLET EVERY DAY, Disp: 45 tablet, Rfl: 3 .  sodium chloride HYPERTONIC 3 % nebulizer solution, Take by nebulization 2 (two) times daily as needed for cough. 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, Disp: 750 mL, Rfl: 12 .  traMADol (ULTRAM) 50 MG tablet, Take 1 tablet (50 mg total) by mouth 2 (two) times daily., Disp:  60 tablet, Rfl: 5 .  traZODone (DESYREL) 100 MG tablet, Take 1 tablet (100 mg total) by mouth at bedtime as needed for sleep., Disp: 30 tablet, Rfl: 0 .  zinc gluconate 50 MG tablet, Take 50 mg by mouth daily., Disp: , Rfl:  .  nitroGLYCERIN (NITROSTAT) 0.4 MG SL tablet, Place 1 tablet (0.4 mg total) under the tongue every 5 (five) minutes as needed., Disp: 25 tablet, Rfl: 3   Allergies  Allergen Reactions  . Ace Inhibitors Swelling    Angioedema; makes tongue "break out"   . Amitriptyline Hcl Other (See Comments)     makes her too sleepy!  . Atorvastatin Other (See Comments)    SEVERE MYALGIA  . Cymbalta [Duloxetine Hcl] Nausea Only and Other (See Comments)    Sleepiness/ sick  . Dilantin [Phenytoin Sodium Extended] Rash    Severe rash  . Paroxetine Nausea Only    Rapid heartbeat  . Ramipril Other (See Comments)    TONGUE ULCERS   . Rosuvastatin Other (See Comments)  SEVERE MYALGIA  . Carbamazepine Rash  . Codeine Itching  . Iodine-131 Other (See Comments)    Swelling at IV site only, no swelling or SOB around mouth.!  . Phenytoin Sodium Extended Rash  . Wellbutrin [Bupropion] Palpitations    Review of Systems   ROS  Negative unless otherwise specified per HPI.  Vitals:   Vitals:   08/26/20 0948  BP: 126/70  Pulse: (!) 58  Temp: (!) 97.3 F (36.3 C)  TempSrc: Temporal  SpO2: 99%  Weight: 223 lb 4 oz (101.3 kg)  Height: 5\' 6"  (1.676 m)     Body mass index is 36.03 kg/m.   Physical Exam:    Physical Exam Vitals and nursing note reviewed.  Constitutional:      General: She is not in acute distress.    Appearance: She is well-developed. She is not ill-appearing, toxic-appearing or sickly-appearing.  Cardiovascular:     Rate and Rhythm: Normal rate and regular rhythm.     Pulses: Normal pulses.     Heart sounds: Normal heart sounds, S1 normal and S2 normal.     Comments: No LE edema Pulmonary:     Effort: Pulmonary effort is normal.     Breath  sounds: Normal breath sounds.  Skin:    General: Skin is warm, dry and intact.  Neurological:     Mental Status: She is alert.     GCS: GCS eye subscore is 4. GCS verbal subscore is 5. GCS motor subscore is 6.  Psychiatric:        Mood and Affect: Mood and affect normal.        Speech: Speech normal.        Behavior: Behavior normal. Behavior is cooperative.      Assessment and Plan:    Onye was seen today for transfer of care and insomnia.  Diagnoses and all orders for this visit:  Other specified hypothyroidism Update TSH today and recommend adjustments to levothyroxine 50 mcg daily. Ideally take this medication without other medications, but she would like to continue her current routine. Follow-up based on results of labs. -     TSH  Insomnia, unspecified type Discussed her current poor sleep hygiene. Main recommendations for patient is no screen time in bed, or ideally 1-2 hours before bed.  Also recommend avoiding spending prolonged time in the bed in the morning watching TV, recommend choosing alternative location for this so body can associate bed with sleeping only. No medication adjustments at this time. Continue to work towards more comfortable CPAP use/settings to promote better compliance.   CMA or LPN served as scribe during this visit. History, Physical, and Plan performed by medical provider. The above documentation has been reviewed and is accurate and complete.   Inda Coke, PA-C [place], [place] 08/26/2020  Follow-up: No follow-ups on file.

## 2020-08-30 NOTE — Progress Notes (Signed)
Chief Complaint:   OBESITY [name] is here to discuss her progress with her obesity treatment plan along with follow-up of her obesity related diagnoses.   Today's visit was #: 15 Starting weight: 223 lbs Starting date: 10/01/2019 Today's weight: 218 lbs Today's date: 08/25/2020 Total lbs lost to date: 5 lbs Body mass index is 35.19 kg/m.  Total weight loss percentage to date: -2.24%  Interim History: [name] says she is getting a Building services engineer.  Nutrition Plan: keeping a food journal and adhering to recommended goals of 1200 calories and 95 grams of protein.  Activity: None at this time. Sleep: Number of hours slept each night: She reports getting wobbly at night with increase trazodone.  Assessment/Plan:   1. Other insomnia Will hold baclofen and continue trazodone. Recommend sleep hygiene measures including regular sleep schedule, optimal sleep environment, and relaxing presleep rituals. She feels that [name] was more helpful, but she does endorse less nighttime eating.   2. Other specified hypothyroidism Medication: [name] is taking levothyroxine 50 mcg daily..   Lab Results  Component Value Date   TSH 3.64 08/26/2020   Plan: Patient was instructed not to take MVM or iron within 4 hours of taking thyroid medications. We will continue to monitor symptoms as they relate to her weight loss journey.  3. Metabolic syndrome Starting goal: Lose 7-10% of starting weight. She will continue to focus on protein-rich, low simple carbohydrate foods. We reviewed the importance of hydration, regular exercise for stress reduction, and restorative sleep.  We will continue to check lab work every 3 months, with 10% weight loss, or should any other concerns arise.  The 10-year ASCVD risk score Adriana Bussing DC Brooke Bonito., Adriana al., [name]) is: 9.1%   Values used to calculate the score:     Age: 70 years     Sex: Female     Is Non-Hispanic African American: Yes     Diabetic: No     Tobacco smoker: No      Systolic Blood Pressure: 295 mmHg     Is BP treated: Yes     HDL Cholesterol: 46.6 mg/dL     Total Cholesterol: 147 mg/dL  4. Visceral obesity Current visceral fat rating: 15. Visceral fat rating should be < 13. Visceral adipose tissue is a hormonally active component of total body fat. This body composition phenotype is associated with medical disorders such as metabolic syndrome, cardiovascular disease and several malignancies including prostate, breast, and colorectal cancers. Starting goal: Lose 7-10% of starting weight.   5. Class 2 severe obesity with serious comorbidity and body mass index (BMI) of 35.0 to 35.9 in adult, unspecified obesity type ([name])  Course: [name] is currently in the action stage of change. As such, her goal is to continue with weight loss efforts.   Nutrition goals: She has agreed to the Category 2 Plan.   Exercise goals: No exercise has been prescribed at this time.  Behavioral modification strategies: increasing lean protein intake, decreasing simple carbohydrates, increasing vegetables, increasing water intake and decreasing liquid calories.  [name] has agreed to follow-up with our clinic in 4 weeks. She was informed of the importance of frequent follow-up visits to maximize her success with intensive lifestyle modifications for her multiple health conditions.   Objective:   Blood pressure 104/67, pulse 63, temperature 97.9 F (36.6 C), temperature source Oral, height 5\' 6"  (1.676 m), weight 218 lb (98.9 kg), SpO2 97 %. Body mass index is 35.19 kg/m.  General: Cooperative, alert, well  developed, in no acute distress. HEENT: Conjunctivae and lids unremarkable. Cardiovascular: Regular rhythm.  Lungs: Normal work of breathing. Neurologic: No focal deficits.   Lab Results  Component Value Date   CREATININE 0.93 02/16/2020   BUN 21 02/16/2020   NA 138 02/16/2020   K 4.2 02/16/2020   CL 104 02/16/2020   CO2 28 02/16/2020   Lab Results  Component Value  Date   ALT 41 (H) 12/07/2019   AST 37 12/07/2019   ALKPHOS 102 12/07/2019   BILITOT 0.5 12/07/2019   Lab Results  Component Value Date   HGBA1C 5.3 12/07/2019   HGBA1C 5.7 (H) 10/01/2019   HGBA1C 5.4 12/04/2018   HGBA1C 5.6 11/27/2017   HGBA1C 5.6 04/12/2017   Lab Results  Component Value Date   INSULIN 15.6 10/01/2019   Lab Results  Component Value Date   TSH 3.64 08/26/2020   Lab Results  Component Value Date   CHOL 147 12/07/2019   HDL 46.60 12/07/2019   LDLCALC 77 12/07/2019   LDLDIRECT 141.8 07/04/2007   TRIG 117.0 12/07/2019   CHOLHDL 3 12/07/2019   Lab Results  Component Value Date   WBC 3.5 (L) 12/07/2019   HGB 12.8 12/07/2019   HCT 39.0 12/07/2019   MCV 91.4 12/07/2019   PLT 178.0 12/07/2019   Lab Results  Component Value Date   IRON 89 10/01/2019   TIBC 335 10/01/2019   FERRITIN 178 (H) 10/01/2019   Obesity Behavioral Intervention:   Approximately 15 minutes were spent on the discussion below.  ASK: We discussed the diagnosis of obesity with [name] today and [name] agreed to give [place] permission to discuss obesity behavioral modification therapy today.  ASSESS: [name] has the diagnosis of obesity and her BMI today is 35.3. [name] is in the action stage of change.   ADVISE: [name] was educated on the multiple health risks of obesity as well as the benefit of weight loss to improve her health. She was advised of the need for long term treatment and the importance of lifestyle modifications to improve her current health and to decrease her risk of future health problems.  AGREE: Multiple dietary modification options and treatment options were discussed and [name] agreed to follow the recommendations documented in the above note.  ARRANGE: [name] was educated on the importance of frequent visits to treat obesity as outlined per CMS and USPSTF guidelines and agreed to schedule her next follow up appointment today.  Attestation Statements:   Reviewed by  clinician on day of visit: allergies, medications, problem list, medical history, surgical history, family history, social history, and previous encounter notes.  I, Water quality scientist, CMA, am acting as transcriptionist for Briscoe Deutscher, DO  I have reviewed the above documentation for accuracy and completeness, and I agree with the above. Briscoe Deutscher, DO

## 2020-08-31 ENCOUNTER — Ambulatory Visit (INDEPENDENT_AMBULATORY_CARE_PROVIDER_SITE_OTHER): Payer: Medicare Other

## 2020-08-31 DIAGNOSIS — I472 Ventricular tachycardia, unspecified: Secondary | ICD-10-CM

## 2020-08-31 LAB — CUP PACEART REMOTE DEVICE CHECK
Battery Remaining Longevity: 36 mo
Battery Remaining Percentage: 38 %
Brady Statistic RV Percent Paced: 0 %
Date Time Interrogation Session: 20220112030300
HighPow Impedance: 73 Ohm
Implantable Lead Implant Date: 20050603
Implantable Lead Location: 753860
Implantable Lead Model: 185
Implantable Lead Serial Number: 116340
Implantable Pulse Generator Implant Date: 20110505
Lead Channel Impedance Value: 645 Ohm
Lead Channel Pacing Threshold Amplitude: 0.7 V
Lead Channel Pacing Threshold Pulse Width: 0.4 ms
Lead Channel Setting Pacing Amplitude: 2.4 V
Lead Channel Setting Pacing Pulse Width: 0.4 ms
Lead Channel Setting Sensing Sensitivity: 0.4 mV
Pulse Gen Serial Number: 266301

## 2020-09-10 ENCOUNTER — Other Ambulatory Visit: Payer: Self-pay | Admitting: Internal Medicine

## 2020-09-13 NOTE — Progress Notes (Signed)
Remote ICD transmission.   

## 2020-09-15 ENCOUNTER — Other Ambulatory Visit: Payer: Self-pay

## 2020-09-15 ENCOUNTER — Ambulatory Visit (INDEPENDENT_AMBULATORY_CARE_PROVIDER_SITE_OTHER): Payer: Medicare Other | Admitting: Family Medicine

## 2020-09-15 ENCOUNTER — Encounter (INDEPENDENT_AMBULATORY_CARE_PROVIDER_SITE_OTHER): Payer: Self-pay | Admitting: Family Medicine

## 2020-09-15 VITALS — BP 156/73 | HR 66 | Temp 97.5°F | Ht 66.0 in | Wt 220.0 lb

## 2020-09-15 DIAGNOSIS — F419 Anxiety disorder, unspecified: Secondary | ICD-10-CM | POA: Diagnosis not present

## 2020-09-15 DIAGNOSIS — G4709 Other insomnia: Secondary | ICD-10-CM

## 2020-09-15 DIAGNOSIS — E8881 Metabolic syndrome: Secondary | ICD-10-CM

## 2020-09-15 DIAGNOSIS — Z6835 Body mass index (BMI) 35.0-35.9, adult: Secondary | ICD-10-CM | POA: Diagnosis not present

## 2020-09-15 MED ORDER — ZOLPIDEM TARTRATE 10 MG PO TABS: 10.0000 mg | ORAL_TABLET | Freq: Every evening | ORAL | 0 refills | Status: DC | PRN

## 2020-09-15 MED ORDER — FLUOXETINE HCL 10 MG PO CAPS: 10.0000 mg | ORAL_CAPSULE | Freq: Every day | ORAL | 3 refills | Status: DC

## 2020-09-19 NOTE — Progress Notes (Signed)
Chief Complaint:   OBESITY [name] is here to discuss her progress with her obesity treatment plan along with follow-up of her obesity related diagnoses.   Today's visit was #: 7 Starting weight: 223 lbs Starting date: 10/01/2019 Today's weight: 220 lbs Today's date: 09/15/2020 Total lbs lost to date: 3 lbs Body mass index is 35.51 kg/m.  Total weight loss percentage to date: -1.35%  Interim History: [name] says she is worried about her sister and other family with [place].  She says she has not been sleeping well.  Trazodone is not working for her.  She has not wanted to leave home due to anxiety and depression.  Nutrition Plan: practicing portion control and making smarter food choices, such as increasing vegetables and decreasing simple carbohydrates.  Activity: None at this time. Sleep: She is not sleeping well at this time.  Assessment/Plan:   1. Other insomnia This is poorly controlled. Dysfunction: difficulty falling asleep and non-restful sleep.   Plan: Recommend sleep hygiene measures including regular sleep schedule, optimal sleep environment, and relaxing presleep rituals.   - Start zolpidem (AMBIEN) 10 MG tablet; Take 1 tablet (10 mg total) by mouth at bedtime as needed for sleep.  Dispense: 30 tablet; Refill: 0  2. Metabolic syndrome Starting goal: Lose 7-10% of starting weight. She will continue to focus on protein-rich, low simple carbohydrate foods. We reviewed the importance of hydration, regular exercise for stress reduction, and restorative sleep.  We will continue to check lab work every 3 months, with 10% weight loss, or should any other concerns arise.  Visceral obesity rating is 15.  3. Anxiety Will start [name] on Prozac 10 mg daily, as per below.  Behavior modification techniques were discussed today to help [name] deal with her anxiety.    - Start FLUoxetine (PROZAC) 10 MG capsule; Take 1 capsule (10 mg total) by mouth daily.  Dispense: 30 capsule;  Refill: 3  4. Class 2 severe obesity with serious comorbidity and body mass index (BMI) of 35.0 to 35.9 in adult, unspecified obesity type ([name])  Course: [name] is currently in the action stage of change. As such, her goal is to continue with weight loss efforts.   Nutrition goals: She has agreed to practicing portion control and making smarter food choices, such as increasing vegetables and decreasing simple carbohydrates.   Exercise goals: All adults should avoid inactivity. Some physical activity is better than none, and adults who participate in any amount of physical activity gain some health benefits.  Behavioral modification strategies: increasing lean protein intake, decreasing simple carbohydrates, increasing vegetables and increasing water intake.  [name] has agreed to follow-up with our clinic in 3 weeks. She was informed of the importance of frequent follow-up visits to maximize her success with intensive lifestyle modifications for her multiple health conditions.   Objective:   Blood pressure (!) 156/73, pulse 66, temperature (!) 97.5 F (36.4 C), temperature source Oral, height 5\' 6"  (1.676 m), weight 220 lb (99.8 kg), SpO2 93 %. Body mass index is 35.51 kg/m.  General: Cooperative, alert, well developed, in no acute distress. HEENT: Conjunctivae and lids unremarkable. Cardiovascular: Regular rhythm.  Lungs: Normal work of breathing. Neurologic: No focal deficits.   Lab Results  Component Value Date   CREATININE 0.93 02/16/2020   BUN 21 02/16/2020   NA 138 02/16/2020   K 4.2 02/16/2020   CL 104 02/16/2020   CO2 28 02/16/2020   Lab Results  Component Value Date   ALT 41 (  H) 12/07/2019   AST 37 12/07/2019   ALKPHOS 102 12/07/2019   BILITOT 0.5 12/07/2019   Lab Results  Component Value Date   HGBA1C 5.3 12/07/2019   HGBA1C 5.7 (H) 10/01/2019   HGBA1C 5.4 12/04/2018   HGBA1C 5.6 11/27/2017   HGBA1C 5.6 04/12/2017   Lab Results  Component Value Date    INSULIN 15.6 10/01/2019   Lab Results  Component Value Date   TSH 3.64 08/26/2020   Lab Results  Component Value Date   CHOL 147 12/07/2019   HDL 46.60 12/07/2019   LDLCALC 77 12/07/2019   LDLDIRECT 141.8 07/04/2007   TRIG 117.0 12/07/2019   CHOLHDL 3 12/07/2019   Lab Results  Component Value Date   WBC 3.5 (L) 12/07/2019   HGB 12.8 12/07/2019   HCT 39.0 12/07/2019   MCV 91.4 12/07/2019   PLT 178.0 12/07/2019   Lab Results  Component Value Date   IRON 89 10/01/2019   TIBC 335 10/01/2019   FERRITIN 178 (H) 10/01/2019   Obesity Behavioral Intervention:   Approximately 15 minutes were spent on the discussion below.  ASK: We discussed the diagnosis of obesity with [name] today and [name] agreed to give [place] permission to discuss obesity behavioral modification therapy today.  ASSESS: [name] has the diagnosis of obesity and her BMI today is 35.6. Jillayne is in the action stage of change.   ADVISE: [name] was educated on the multiple health risks of obesity as well as the benefit of weight loss to improve her health. She was advised of the need for long term treatment and the importance of lifestyle modifications to improve her current health and to decrease her risk of future health problems.  AGREE: Multiple dietary modification options and treatment options were discussed and [name] agreed to follow the recommendations documented in the above note.  ARRANGE: [name] was educated on the importance of frequent visits to treat obesity as outlined per CMS and USPSTF guidelines and agreed to schedule her next follow up appointment today.  Attestation Statements:   Reviewed by clinician on day of visit: allergies, medications, problem list, medical history, surgical history, family history, social history, and previous encounter notes.  I, Water quality scientist, CMA, am acting as transcriptionist for Adriana Deutscher, DO  I have reviewed the above documentation for accuracy and completeness, and  I agree with the above. Adriana Deutscher, DO

## 2020-10-03 ENCOUNTER — Other Ambulatory Visit: Payer: Self-pay

## 2020-10-03 ENCOUNTER — Encounter
Payer: Medicare Other | Attending: Physical Medicine and Rehabilitation | Admitting: Physical Medicine and Rehabilitation

## 2020-10-03 ENCOUNTER — Encounter: Payer: Self-pay | Admitting: Physical Medicine and Rehabilitation

## 2020-10-03 VITALS — BP 121/81 | HR 62 | Temp 98.2°F | Ht 66.0 in | Wt 222.2 lb

## 2020-10-03 DIAGNOSIS — M7061 Trochanteric bursitis, right hip: Secondary | ICD-10-CM | POA: Diagnosis present

## 2020-10-03 DIAGNOSIS — M25562 Pain in left knee: Secondary | ICD-10-CM | POA: Diagnosis not present

## 2020-10-03 DIAGNOSIS — M25561 Pain in right knee: Secondary | ICD-10-CM | POA: Insufficient documentation

## 2020-10-03 DIAGNOSIS — G8929 Other chronic pain: Secondary | ICD-10-CM | POA: Diagnosis present

## 2020-10-03 DIAGNOSIS — Z5181 Encounter for therapeutic drug level monitoring: Secondary | ICD-10-CM | POA: Insufficient documentation

## 2020-10-03 DIAGNOSIS — Z79891 Long term (current) use of opiate analgesic: Secondary | ICD-10-CM | POA: Diagnosis present

## 2020-10-03 MED ORDER — MAGNESIUM OXIDE 400 MG PO TABS: 400.0000 mg | ORAL_TABLET | Freq: Three times a day (TID) | ORAL | 3 refills | Status: DC

## 2020-10-03 MED ORDER — TRAMADOL HCL 50 MG PO TABS: 50.0000 mg | ORAL_TABLET | Freq: Two times a day (BID) | ORAL | 1 refills | Status: DC

## 2020-10-03 NOTE — Progress Notes (Signed)
Patient is a 24 yr oldR handedfemale with hx of Bronchiesctasis, CHF, prediabetes- with A1c of 5.3- - not on blood thinners, has a defibrillator, GERD, and fibromyalgia issues- dx'd 10 years ago. Herefor R knee and R trochanteric bursa injection..   Using voltaren gel on R knee  Started taking sea moss  Had to back off Cinnamon Hasn't tried Tumeric.  Haven't been using the Baclofen- since "on too much medicine".  So been trying to wean it.    [facility]   Thought she was on too many meds.   R knee and R trochanteric bursa injections lasted 2 months or so- so wore off ~ 1 month ago.   Taking Magnesium 2 tabs/day-    Assessment:  Patient is a 43 yr oldR handedfemale with hx of Bronchiesctasis, CHF, prediabetes- with A1c of 5.3- - not on blood thinners, has a defibrillator, GERD, and fibromyalgia issues- dx'd 10 years ago. Herefor R knee and R trochanteric bursa injection..    Plan: 1. Tumeric 500 mg 2x/day for inflammation- can sometimes help joint pain. -   2. Can Magnesium Oxide for muscle spasms and constipation- can increase to 3x/day to improve chances might need less Miralax/no miralax and help muscle spasms more.   3. steroid injection was performed at R knee and R trochanteric bursa using 1% plain Lidocaine and 40mg  /1cc of Kenalog. This was well tolerated.  Cleaned with betadine x3 and allowed to dry- then alcohol then injected using 27 gauge 1.5 inch needle- no bleeding or complications.    F/U in 3 months for steroid injections R knee and R trochanteric bursa Lidocaine will kick in 15 minutes- and wear off tonight- the steroid will kick in tomorrow within 24 hours and take up to 72 hours to fully kick in.  4. Renewed Tramadol 50 mg BID #180 for 90 day refill with 1 refill  5. Sent Rx's to [facility]- but if needs me to send to [facility], let me know.   6. F/U in 3 months   I spent a total of 30 minutes on visit-  as detailed above.

## 2020-10-03 NOTE — Patient Instructions (Signed)
Assessment:  Patient is a 1 yr oldR handedfemale with hx of Bronchiesctasis, CHF, prediabetes- with A1c of 5.3- - not on blood thinners, has a defibrillator, GERD, and fibromyalgia issues- dx'd 10 years ago. Herefor R knee and R trochanteric bursa injection..    Plan: 1. Tumeric 500 mg 2x/day for inflammation- can sometimes help joint pain. -   2. Can Magnesium Oxide for muscle spasms and constipation- can increase to 3x/day to improve chances might need less Miralax/no miralax and help muscle spasms more.   3. steroid injection was performed at R knee and R trochanteric bursa using 1% plain Lidocaine and 40mg  /1cc of Kenalog. This was well tolerated.  Cleaned with betadine x3 and allowed to dry- then alcohol then injected using 27 gauge 1.5 inch needle- no bleeding or complications.    F/U in 3 months for steroid injections R knee and R trochanteric bursa Lidocaine will kick in 15 minutes- and wear off tonight- the steroid will kick in tomorrow within 24 hours and take up to 72 hours to fully kick in.  4. Renewed Tramadol 50 mg BID #180 for 90 day refill with 1 refill  5. Sent Rx's to [facility]- but if needs me to send to [facility], let me know.   6. F/U in 3 months opiate drug screen

## 2020-10-05 ENCOUNTER — Ambulatory Visit (INDEPENDENT_AMBULATORY_CARE_PROVIDER_SITE_OTHER): Payer: Medicare Other | Admitting: Family Medicine

## 2020-10-05 ENCOUNTER — Encounter (INDEPENDENT_AMBULATORY_CARE_PROVIDER_SITE_OTHER): Payer: Self-pay | Admitting: Family Medicine

## 2020-10-05 ENCOUNTER — Other Ambulatory Visit: Payer: Self-pay

## 2020-10-05 VITALS — BP 132/73 | HR 59 | Temp 97.8°F | Ht 66.0 in | Wt 216.0 lb

## 2020-10-05 DIAGNOSIS — E8881 Metabolic syndrome: Secondary | ICD-10-CM | POA: Diagnosis not present

## 2020-10-05 DIAGNOSIS — Z6834 Body mass index (BMI) 34.0-34.9, adult: Secondary | ICD-10-CM

## 2020-10-05 DIAGNOSIS — F419 Anxiety disorder, unspecified: Secondary | ICD-10-CM

## 2020-10-05 DIAGNOSIS — G4709 Other insomnia: Secondary | ICD-10-CM

## 2020-10-05 DIAGNOSIS — E669 Obesity, unspecified: Secondary | ICD-10-CM

## 2020-10-05 MED ORDER — FLUOXETINE HCL 10 MG PO CAPS
10.0000 mg | ORAL_CAPSULE | Freq: Every day | ORAL | 0 refills | Status: DC
Start: 2020-10-05 — End: 2020-11-08

## 2020-10-09 LAB — TOXASSURE SELECT,+ANTIDEPR,UR

## 2020-10-10 ENCOUNTER — Other Ambulatory Visit (INDEPENDENT_AMBULATORY_CARE_PROVIDER_SITE_OTHER): Payer: Self-pay | Admitting: Family Medicine

## 2020-10-10 DIAGNOSIS — G4709 Other insomnia: Secondary | ICD-10-CM

## 2020-10-10 NOTE — Telephone Encounter (Signed)
Dr Wallace pt °

## 2020-10-11 ENCOUNTER — Other Ambulatory Visit: Payer: Self-pay | Admitting: Pulmonary Disease

## 2020-10-11 ENCOUNTER — Encounter (INDEPENDENT_AMBULATORY_CARE_PROVIDER_SITE_OTHER): Payer: Self-pay

## 2020-10-11 ENCOUNTER — Telehealth: Payer: Self-pay | Admitting: *Deleted

## 2020-10-11 DIAGNOSIS — G4709 Other insomnia: Secondary | ICD-10-CM

## 2020-10-11 NOTE — Telephone Encounter (Signed)
Message sent to pt.

## 2020-10-11 NOTE — Progress Notes (Signed)
Chief Complaint:   OBESITY [name] is here to discuss her progress with her obesity treatment plan along with follow-up of her obesity related diagnoses.   Today's visit was #: 20 Starting weight: 223 lbs Starting date: 10/01/2019 Today's weight: 216 lbs Today's date: 10/05/2020 Total lbs lost to date: 7 lbs Body mass index is 34.86 kg/m.  Total weight loss percentage to date: -3.14%  Interim History: [name] says she feels good.  She is sleeping well.  She says her mood has improved. Nutrition Plan: practicing portion control and making smarter food choices, such as increasing vegetables and decreasing simple carbohydrates for 0% of the time. Activity: None at this time.  Assessment/Plan:   1. Metabolic syndrome Starting goal: Lose 7-10% of starting weight. She will continue to focus on protein-rich, low simple carbohydrate foods. We reviewed the importance of hydration, regular exercise for stress reduction, and restorative sleep.  We will continue to check lab work every 3 months, with 10% weight loss, or should any other concerns arise.  2. Other insomnia This is well controlled with medication.  Current treatment: Ambien 10 mg as needed at bedtime.  Plan: Recommend sleep hygiene measures including regular sleep schedule, optimal sleep environment, and relaxing presleep rituals.   3. Anxiety [name] is taking Prozac 10 mg daily.  Plan:  Behavior modification techniques were discussed today to help [name] deal with her anxiety.  Orders and follow up as documented in patient record.  Will refill Prozac today, as per below.  - Refill FLUoxetine (PROZAC) 10 MG capsule; Take 1 capsule (10 mg total) by mouth daily.  Dispense: 90 capsule; Refill: 0  4. Class 1 obesity with serious comorbidity and body mass index (BMI) of 34.0 to 34.9 in adult, unspecified obesity type  Course: [name] is currently in the action stage of change. As such, her goal is to continue with weight loss  efforts.   Nutrition goals: She has agreed to practicing portion control and making smarter food choices, such as increasing vegetables and decreasing simple carbohydrates.   Exercise goals: Biking for 1 hours once per week.  Behavioral modification strategies: increasing water intake.  [name] has agreed to follow-up with our clinic in 4 weeks. She was informed of the importance of frequent follow-up visits to maximize her success with intensive lifestyle modifications for her multiple health conditions.   Objective:   Blood pressure 132/73, pulse (!) 59, temperature 97.8 F (36.6 C), temperature source Oral, height 5\' 6"  (1.676 m), weight 216 lb (98 kg), SpO2 100 %. Body mass index is 34.86 kg/m.  General: Cooperative, alert, well developed, in no acute distress. HEENT: Conjunctivae and lids unremarkable. Cardiovascular: Regular rhythm.  Lungs: Normal work of breathing. Neurologic: No focal deficits.   Lab Results  Component Value Date   CREATININE 0.93 02/16/2020   BUN 21 02/16/2020   NA 138 02/16/2020   K 4.2 02/16/2020   CL 104 02/16/2020   CO2 28 02/16/2020   Lab Results  Component Value Date   ALT 41 (H) 12/07/2019   AST 37 12/07/2019   ALKPHOS 102 12/07/2019   BILITOT 0.5 12/07/2019   Lab Results  Component Value Date   HGBA1C 5.3 12/07/2019   HGBA1C 5.7 (H) 10/01/2019   HGBA1C 5.4 12/04/2018   HGBA1C 5.6 11/27/2017   HGBA1C 5.6 04/12/2017   Lab Results  Component Value Date   INSULIN 15.6 10/01/2019   Lab Results  Component Value Date   TSH 3.64 08/26/2020  Lab Results  Component Value Date   CHOL 147 12/07/2019   HDL 46.60 12/07/2019   LDLCALC 77 12/07/2019   LDLDIRECT 141.8 07/04/2007   TRIG 117.0 12/07/2019   CHOLHDL 3 12/07/2019   Lab Results  Component Value Date   WBC 3.5 (L) 12/07/2019   HGB 12.8 12/07/2019   HCT 39.0 12/07/2019   MCV 91.4 12/07/2019   PLT 178.0 12/07/2019   Lab Results  Component Value Date   IRON 89 10/01/2019    TIBC 335 10/01/2019   FERRITIN 178 (H) 10/01/2019   Obesity Behavioral Intervention:   Approximately 15 minutes were spent on the discussion below.  ASK: We discussed the diagnosis of obesity with [name] today and [name] agreed to give [place] permission to discuss obesity behavioral modification therapy today.  ASSESS: [name] has the diagnosis of obesity and her BMI today is 34.9. [name] is in the action stage of change.   ADVISE: [name] was educated on the multiple health risks of obesity as well as the benefit of weight loss to improve her health. She was advised of the need for long term treatment and the importance of lifestyle modifications to improve her current health and to decrease her risk of future health problems.  AGREE: Multiple dietary modification options and treatment options were discussed and [name] agreed to follow the recommendations documented in the above note.  ARRANGE: [name] was educated on the importance of frequent visits to treat obesity as outlined per CMS and USPSTF guidelines and agreed to schedule her next follow up appointment today.  Attestation Statements:   Reviewed by clinician on day of visit: allergies, medications, problem list, medical history, surgical history, family history, social history, and previous encounter notes.  I, Water quality scientist, CMA, am acting as transcriptionist for Briscoe Deutscher, DO  I have reviewed the above documentation for accuracy and completeness, and I agree with the above. Briscoe Deutscher, DO

## 2020-10-11 NOTE — Telephone Encounter (Signed)
Urine drug screen is consistent with Tramadol but is also positive for alcohol and THC.

## 2020-10-13 ENCOUNTER — Telehealth (INDEPENDENT_AMBULATORY_CARE_PROVIDER_SITE_OTHER): Payer: Self-pay

## 2020-10-13 NOTE — Telephone Encounter (Signed)
Pt called about her pharmacy informing her that her zolpidem was denied, please give pt's pharmacy [facility] a call regarding this medication refill.

## 2020-10-13 NOTE — Telephone Encounter (Signed)
Last OV with Dr Wallace 

## 2020-10-16 ENCOUNTER — Other Ambulatory Visit: Payer: Self-pay | Admitting: Internal Medicine

## 2020-10-17 MED ORDER — ZOLPIDEM TARTRATE 10 MG PO TABS: 10.0000 mg | ORAL_TABLET | Freq: Every evening | ORAL | 0 refills | Status: DC | PRN

## 2020-10-17 NOTE — Telephone Encounter (Signed)
Refill request sent to Dr Wallace °

## 2020-10-27 ENCOUNTER — Ambulatory Visit (INDEPENDENT_AMBULATORY_CARE_PROVIDER_SITE_OTHER): Payer: Medicare Other

## 2020-10-27 ENCOUNTER — Encounter: Payer: Self-pay | Admitting: Podiatry

## 2020-10-27 ENCOUNTER — Other Ambulatory Visit: Payer: Self-pay

## 2020-10-27 ENCOUNTER — Ambulatory Visit (INDEPENDENT_AMBULATORY_CARE_PROVIDER_SITE_OTHER): Payer: Medicare Other | Admitting: Podiatry

## 2020-10-27 DIAGNOSIS — M79674 Pain in right toe(s): Secondary | ICD-10-CM

## 2020-10-27 DIAGNOSIS — M79675 Pain in left toe(s): Secondary | ICD-10-CM

## 2020-10-27 DIAGNOSIS — M2042 Other hammer toe(s) (acquired), left foot: Secondary | ICD-10-CM | POA: Diagnosis not present

## 2020-10-27 DIAGNOSIS — M2041 Other hammer toe(s) (acquired), right foot: Secondary | ICD-10-CM | POA: Diagnosis not present

## 2020-10-27 NOTE — Progress Notes (Signed)
Subjective:   Patient ID: [name], female   DOB: 70 y.o.   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  Patient wants surgery willing to accept risk of surgery and I allowed her to sign consent form today after review understanding all complications as listed.  Patient scheduled outpatient surgery encouraged to call with any questions which may come up  X-rays indicate elongated third digits bilateral with distal contracture noted

## 2020-10-29 ENCOUNTER — Other Ambulatory Visit: Payer: Self-pay | Admitting: Family Medicine

## 2020-11-01 ENCOUNTER — Telehealth: Payer: Self-pay | Admitting: Pulmonary Disease

## 2020-11-01 DIAGNOSIS — J479 Bronchiectasis, uncomplicated: Secondary | ICD-10-CM

## 2020-11-01 NOTE — Telephone Encounter (Signed)
Called and spoke with patient to let her know that I got approval from [place], NP to put flutter device up front to her to pick up at her convenience. Patient expressed understanding. Nothing further needed at this time.

## 2020-11-08 ENCOUNTER — Ambulatory Visit (INDEPENDENT_AMBULATORY_CARE_PROVIDER_SITE_OTHER): Payer: Medicare Other | Admitting: Family Medicine

## 2020-11-08 ENCOUNTER — Other Ambulatory Visit: Payer: Self-pay | Admitting: Internal Medicine

## 2020-11-08 ENCOUNTER — Other Ambulatory Visit: Payer: Self-pay

## 2020-11-08 ENCOUNTER — Encounter (INDEPENDENT_AMBULATORY_CARE_PROVIDER_SITE_OTHER): Payer: Self-pay | Admitting: Family Medicine

## 2020-11-08 VITALS — BP 125/66 | HR 63 | Temp 98.0°F | Ht 66.0 in | Wt 212.0 lb

## 2020-11-08 DIAGNOSIS — G4709 Other insomnia: Secondary | ICD-10-CM | POA: Diagnosis not present

## 2020-11-08 DIAGNOSIS — E669 Obesity, unspecified: Secondary | ICD-10-CM | POA: Diagnosis not present

## 2020-11-08 DIAGNOSIS — Z6834 Body mass index (BMI) 34.0-34.9, adult: Secondary | ICD-10-CM | POA: Diagnosis not present

## 2020-11-08 DIAGNOSIS — F419 Anxiety disorder, unspecified: Secondary | ICD-10-CM

## 2020-11-08 DIAGNOSIS — R7303 Prediabetes: Secondary | ICD-10-CM

## 2020-11-08 DIAGNOSIS — R002 Palpitations: Secondary | ICD-10-CM

## 2020-11-08 MED ORDER — ZOLPIDEM TARTRATE 10 MG PO TABS: 10.0000 mg | ORAL_TABLET | Freq: Every evening | ORAL | 0 refills | Status: DC | PRN

## 2020-11-08 MED ORDER — FLUOXETINE HCL 10 MG PO CAPS: 10.0000 mg | ORAL_CAPSULE | Freq: Every day | ORAL | 0 refills | Status: DC

## 2020-11-14 MED ORDER — HYDROCODONE-ACETAMINOPHEN 10-325 MG PO TABS: 1.0000 | ORAL_TABLET | Freq: Three times a day (TID) | ORAL | 0 refills | Status: AC | PRN

## 2020-11-14 NOTE — Addendum Note (Signed)
Addended by: Wallene Huh on: 11/14/2020 02:39 PM   Modules accepted: Orders

## 2020-11-15 ENCOUNTER — Other Ambulatory Visit: Payer: Self-pay | Admitting: *Deleted

## 2020-11-15 ENCOUNTER — Encounter: Payer: Self-pay | Admitting: Podiatry

## 2020-11-15 DIAGNOSIS — M2042 Other hammer toe(s) (acquired), left foot: Secondary | ICD-10-CM | POA: Diagnosis not present

## 2020-11-15 DIAGNOSIS — M2041 Other hammer toe(s) (acquired), right foot: Secondary | ICD-10-CM | POA: Diagnosis not present

## 2020-11-15 HISTORY — PX: HAMMER TOE SURGERY: SHX385

## 2020-11-15 MED ORDER — BACLOFEN 10 MG PO TABS: ORAL_TABLET | ORAL | 0 refills | Status: DC

## 2020-11-15 MED ORDER — DICLOFENAC SODIUM 1 % EX GEL
CUTANEOUS | 2 refills | Status: DC
Start: 2020-11-15 — End: 2022-09-19

## 2020-11-15 NOTE — Telephone Encounter (Addendum)
RF faxed from [facility]  Next Visit: 12/14/2020  Last Visit: 06/07/2020  Last Fill: 08/18/2020 baclofen, 06/07/2020 diclofenac  Dx: Fibromyalgia  Current Dose per office note on 06/07/2020,  baclofen 10 mg 1 tablet by mouth at bedtime as needed for muscle spasms, diclofenac not mentioned  Okay to refill Baclofen and diclofenac?

## 2020-11-16 ENCOUNTER — Telehealth: Payer: Self-pay

## 2020-11-16 ENCOUNTER — Other Ambulatory Visit: Payer: Self-pay | Admitting: Family Medicine

## 2020-11-16 MED ORDER — LEVOTHYROXINE SODIUM 50 MCG PO TABS: 50.0000 ug | ORAL_TABLET | Freq: Every day | ORAL | 3 refills | Status: DC

## 2020-11-16 NOTE — Progress Notes (Signed)
Chief Complaint:   OBESITY [name] is here to discuss her progress with her obesity treatment plan along with follow-up of her obesity related diagnoses.   Today's visit was #: 18 Starting weight: 223 lbs Starting date: 10/01/2019 Today's weight: 212 lbs Today's date: 11/08/2020 Total lbs lost to date: 11 lbs Body mass index is 34.22 kg/m.  Total weight loss percentage to date: -4.93%  Interim History:  [employer] gave up sugar on March 6 (for [place]).  She has decreased her portion sizes and has been doing time restricted eating.  She says she feels satisfied.  Still some sugar cravings.  Plans to get out in the yard.  Current Meal Plan: practicing portion control and making smarter food choices, such as increasing vegetables and decreasing simple carbohydrates for 0% of the time.  Current Exercise Plan: Increased walking.  Assessment/Plan:   1. Palpitations She says she will make an appointment with Cardiology.  Has pacemaker/defibrillator.   2. Other insomnia This is well controlled.  Current treatment: Ambien 10 mg at bedtime as needed.  Plan: Recommend sleep hygiene measures including regular sleep schedule, optimal sleep environment, and relaxing presleep rituals.  Will refill Ambien today, as per below.  I have consulted the [place] Controlled Substances Registry for this patient, and feel the risk/benefit ratio today is favorable for proceeding with this prescription for a controlled substance. The patient understands monitoring parameters and red flags.   - Refill zolpidem (AMBIEN) 10 MG tablet; Take 1 tablet (10 mg total) by mouth at bedtime as needed for sleep.  Dispense: 30 tablet; Refill: 0  3. Prediabetes Not at goal. Goal is HgbA1c < 5.7.  Medication: None.    Plan:  She will continue to focus on protein-rich, low simple carbohydrate foods. We reviewed the importance of hydration, regular exercise for stress reduction, and restorative sleep.  Needs glucometer and supplies  to check blood sugar twice daily.  Lab Results  Component Value Date   HGBA1C 5.3 12/07/2019   Lab Results  Component Value Date   INSULIN 15.6 10/01/2019   4. Anxiety Alitzel is taking Prozac 10 mg daily for anxiety. Behavior modification techniques were discussed today to help Donnamaria deal with her anxiety.    - Refill FLUoxetine (PROZAC) 10 MG capsule; Take 1 capsule (10 mg total) by mouth daily.  Dispense: 90 capsule; Refill: 0  5. Class 1 obesity with serious comorbidity and body mass index (BMI) of 34.0 to 34.9 in adult, unspecified obesity type  Course: Alicianna is currently in the action stage of change. As such, her goal is to continue with weight loss efforts.   Nutrition goals: She has agreed to practicing portion control and making smarter food choices, such as increasing vegetables and decreasing simple carbohydrates.   Exercise goals: As is.  Behavioral modification strategies: increasing lean protein intake, decreasing simple carbohydrates, increasing vegetables and increasing water intake.  Arzella has agreed to follow-up with our clinic in 4 weeks. She was informed of the importance of frequent follow-up visits to maximize her success with intensive lifestyle modifications for her multiple health conditions.   Objective:   Blood pressure 125/66, pulse 63, temperature 98 F (36.7 C), temperature source Oral, height 5\' 6"  (1.676 m), weight 212 lb (96.2 kg), SpO2 98 %. Body mass index is 34.22 kg/m.  General: Cooperative, alert, well developed, in no acute distress. HEENT: Conjunctivae and lids unremarkable. Cardiovascular: Regular rhythm.  Lungs: Normal work of breathing. Neurologic: No focal deficits.  Lab Results  Component Value Date   CREATININE 0.93 02/16/2020   BUN 21 02/16/2020   NA 138 02/16/2020   K 4.2 02/16/2020   CL 104 02/16/2020   CO2 28 02/16/2020   Lab Results  Component Value Date   ALT 41 (H) 12/07/2019   AST 37 12/07/2019   ALKPHOS 102  12/07/2019   BILITOT 0.5 12/07/2019   Lab Results  Component Value Date   HGBA1C 5.3 12/07/2019   HGBA1C 5.7 (H) 10/01/2019   HGBA1C 5.4 12/04/2018   HGBA1C 5.6 11/27/2017   HGBA1C 5.6 04/12/2017   Lab Results  Component Value Date   INSULIN 15.6 10/01/2019   Lab Results  Component Value Date   TSH 3.64 08/26/2020   Lab Results  Component Value Date   CHOL 147 12/07/2019   HDL 46.60 12/07/2019   LDLCALC 77 12/07/2019   LDLDIRECT 141.8 07/04/2007   TRIG 117.0 12/07/2019   CHOLHDL 3 12/07/2019   Lab Results  Component Value Date   WBC 3.5 (L) 12/07/2019   HGB 12.8 12/07/2019   HCT 39.0 12/07/2019   MCV 91.4 12/07/2019   PLT 178.0 12/07/2019   Lab Results  Component Value Date   IRON 89 10/01/2019   TIBC 335 10/01/2019   FERRITIN 178 (H) 10/01/2019   Obesity Behavioral Intervention:   Approximately 15 minutes were spent on the discussion below.  ASK: We discussed the diagnosis of obesity with [name] today and Aniko agreed to give [place] permission to discuss obesity behavioral modification therapy today.  ASSESS: Rainy has the diagnosis of obesity and her BMI today is 34.2. Camay is in the action stage of change.   ADVISE: Micaiah was educated on the multiple health risks of obesity as well as the benefit of weight loss to improve her health. She was advised of the need for long term treatment and the importance of lifestyle modifications to improve her current health and to decrease her risk of future health problems.  AGREE: Multiple dietary modification options and treatment options were discussed and Kishana agreed to follow the recommendations documented in the above note.  ARRANGE: Monique was educated on the importance of frequent visits to treat obesity as outlined per CMS and USPSTF guidelines and agreed to schedule her next follow up appointment today.  Attestation Statements:   Reviewed by clinician on day of visit: allergies, medications, problem list,  medical history, surgical history, family history, social history, and previous encounter notes.  I, Water quality scientist, CMA, am acting as transcriptionist for Briscoe Deutscher, DO  I have reviewed the above documentation for accuracy and completeness, and I agree with the above. Briscoe Deutscher, DO

## 2020-11-16 NOTE — Telephone Encounter (Signed)
MEDICATION:   levothyroxine (SYNTHROID) 50 MCG tablet  PHARMACY:  [place], [place] Phone:  [phone]  Fax:  [phone]       Comments:   **Let patient know to contact pharmacy at the end of the day to make sure medication is ready. **  ** Please notify patient to allow 48-72 hours to process**  **Encourage patient to contact the pharmacy for refills or they can request refills through [facility]**

## 2020-11-16 NOTE — Telephone Encounter (Signed)
Spoke to pt told her Rx Levothyroxine was sent to [facility] mail order as requested. Pt verbalized understanding.

## 2020-11-18 ENCOUNTER — Telehealth: Payer: Self-pay | Admitting: Podiatry

## 2020-11-18 NOTE — Telephone Encounter (Signed)
Pt is experiencing discomfort from her ace bandage and would like to know if she could take it off. Please advise.

## 2020-11-23 ENCOUNTER — Ambulatory Visit (INDEPENDENT_AMBULATORY_CARE_PROVIDER_SITE_OTHER): Payer: Medicare Other | Admitting: Podiatry

## 2020-11-23 ENCOUNTER — Encounter: Payer: Self-pay | Admitting: Podiatry

## 2020-11-23 ENCOUNTER — Other Ambulatory Visit: Payer: Self-pay

## 2020-11-23 ENCOUNTER — Ambulatory Visit (INDEPENDENT_AMBULATORY_CARE_PROVIDER_SITE_OTHER): Payer: Medicare Other

## 2020-11-23 DIAGNOSIS — M2041 Other hammer toe(s) (acquired), right foot: Secondary | ICD-10-CM | POA: Diagnosis not present

## 2020-11-23 DIAGNOSIS — Z9889 Other specified postprocedural states: Secondary | ICD-10-CM

## 2020-11-23 DIAGNOSIS — M2042 Other hammer toe(s) (acquired), left foot: Secondary | ICD-10-CM

## 2020-11-23 DIAGNOSIS — L600 Ingrowing nail: Secondary | ICD-10-CM

## 2020-11-23 NOTE — Patient Instructions (Signed)

## 2020-11-23 NOTE — Progress Notes (Signed)
Subjective:   Patient ID: [name], female   DOB: 70 y.o.   MRN: [id number]   HPI Patient presents stating she is doing well with her toes and feels like she has a cast on and the ingrown toenail on her left foot is really bothering her more now   ROS      Objective:  Physical Exam  Neurovascular status intact negative Bevelyn Buckles' sign noted digit 3 bilateral healing well wound edges well coapted upon removal of dressing with incurvated lateral border left hallux painful     Assessment:  Doing well post arthroplasty distal digit 3 bilateral with a ingrown toenail deformity left hallux lateral border with pain     Plan:  H&P x-rays reviewed and recommended correction of ingrown toenail.  Today I went ahead and I reviewed procedure allowed her to sign consent form understanding risk and today I infiltrated the left hallux 60 mg like Marcaine mixture sterile prep done using sterile instrumentation remove the lateral border exposed matrix applied phenol 3 applications 30 seconds followed by alcohol lavage and sterile dressing.  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

## 2020-11-30 ENCOUNTER — Ambulatory Visit (INDEPENDENT_AMBULATORY_CARE_PROVIDER_SITE_OTHER): Payer: Medicare Other

## 2020-11-30 DIAGNOSIS — I472 Ventricular tachycardia: Secondary | ICD-10-CM | POA: Diagnosis not present

## 2020-11-30 DIAGNOSIS — I4729 Other ventricular tachycardia: Secondary | ICD-10-CM

## 2020-11-30 NOTE — Progress Notes (Signed)
Office Visit Note  Patient: [name]             Date of Birth: [birth year]           MRN: [id number]             PCP: Inda Coke, PA Referring: Tower, [name], MD Visit Date: 12/14/2020 Occupation: @GUAROCC @  Subjective:  Other (Right knee pain )   History of Present Illness: [name] is a 70 y.o. female history of fibromyalgia and osteoarthritis.  She states that recently she has been having pain and discomfort in her right knee joint.  She has intermittent swelling in her right knee.  She was given a cortisone injection by Dr. Dagoberto Ligas recently.  She has a follow-up appointment in May.  She continues to have some generalized pain from fibromyalgia.  Activities of Daily Living:  Patient reports morning stiffness for 5-10 minutes.   Patient Reports nocturnal pain.  Difficulty dressing/grooming: Denies Difficulty climbing stairs: Reports Difficulty getting out of chair: Reports Difficulty using hands for taps, buttons, cutlery, and/or writing: Reports  Review of Systems  Constitutional: Positive for fatigue.  HENT: Positive for mouth dryness and nose dryness. Negative for mouth sores.   Eyes: Positive for dryness. Negative for pain and itching.  Respiratory: Positive for shortness of breath. Negative for difficulty breathing.   Cardiovascular: Positive for palpitations. Negative for chest pain.  Gastrointestinal: Negative for blood in stool, constipation and diarrhea.  Endocrine: Negative for increased urination.  Genitourinary: Negative for difficulty urinating.  Musculoskeletal: Positive for arthralgias, joint pain, joint swelling and morning stiffness. Negative for myalgias, muscle tenderness and myalgias.  Skin: Negative for color change, rash and redness.  Allergic/Immunologic: Negative for susceptible to infections.  Neurological: Positive for dizziness. Negative for numbness, headaches, memory loss and weakness.  Hematological:  Negative for bruising/bleeding tendency.  Psychiatric/Behavioral: Negative for confusion.    PMFS History:  Patient Active Problem List   Diagnosis Date Noted  . Trochanteric bursitis of right hip 10/03/2020  . Tailbone injury 05/05/2020  . UTI (urinary tract infection) 05/05/2020  . Insomnia 05/05/2020  . Leg cramps 02/16/2020  . Dysuria 02/16/2020  . Right hip impingement syndrome 02/01/2020  . Right bicipital tenosynovitis 02/01/2020  . Bronchiectasis without complication ([place]) 78/29/5621  . Myofascial pain 12/21/2019  . Estrogen deficiency 12/10/2019  . Obesity (BMI 30-39.9) 12/10/2019  . Bronchiectasis with (acute) exacerbation ([place]) 10/23/2019  . Elevated LFTs 10/15/2019  . Lower abdominal pain 09/01/2019  . Neck pain 10/25/2017  . Paresthesias in left hand 10/25/2017  . Paresthesia of foot, bilateral 04/12/2017  . Primary osteoarthritis of both hands 09/27/2016  . Primary osteoarthritis of both knees 09/27/2016  . 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  . Nasopharyngitis, 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/2016  . S/P total knee replacement 02/13/2016  . Chronic pain of both knees 12/06/2015  . Globus pharyngeus 05/17/2015  . Genetic testing 12/23/2014  . Family history of breast cancer   . Family history of colon cancer   . Family history of pancreatic cancer   . History of breast cancer 11/23/2014  . Allergy to ACE inhibitors 09/27/2014  . Prediabetes 06/23/2013  . RLS (restless legs syndrome) 01/15/2013  . Hx of Clostridium difficile infection 10/10/2012  . Dyspnea on exertion 04/02/2012  . NICM (nonischemic cardiomyopathy) ([place]) 03/07/2012  . Post-menopausal 01/11/2012  . Obstructive sleep apnea 04/04/2010  . V-tach ([place]) 01/03/2009  . ICD  [employer]  Single chamber 01/03/2009  . PFO (patent foramen ovale)  09/29/2008  . Vitamin D deficiency 08/17/2008  . Hypothyroidism 11/18/2006  . Hyperlipidemia 11/18/2006  . Depression with anxiety 11/18/2006  . Essential  hypertension 11/18/2006  . Mitral valve prolapse 11/18/2006  . Cerebral aneurysm 11/18/2006  . Allergic rhinitis 11/18/2006  . GERD 11/18/2006  . Diverticulosis of colon 11/18/2006  . Fatty liver 11/18/2006  . Fibromyalgia 11/18/2006    Past Medical History:  Diagnosis Date  . AICD (automatic cardioverter/defibrillator) present   . Anxiety   . Arthritis   . Back pain   . 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 Bronchiectasis ([place])   . Cerebral aneurysm, nonruptured    had a clip put in  . CHF (congestive heart failure) ([place])   . Clostridium difficile infection   . Depressive disorder, not elsewhere classified   . Diverticulosis of colon (without mention of hemorrhage)   . Esophageal reflux   . Fatty liver   . Fibromyalgia   . Gastritis   . GERD (gastroesophageal reflux disease)   . GI bleed 2004  . Glaucoma   . Hiatal hernia   . Hyperlipidemia   . Hypertension   . Hypothyroidism   . Internal hemorrhoids   . Joint pain   . Obstructive sleep apnea (adult) (pediatric)   . Osteoarthritis   . Ostium secundum type atrial septal defect   . Other chronic nonalcoholic liver disease   . Other pulmonary embolism and infarction   . Palpitations   . Paroxysmal ventricular tachycardia ([place])   . Personal history of radiation therapy   . Pneumonia    history  . Presence of permanent cardiac pacemaker   . PUD (peptic ulcer disease)   . Radiation 02/03/15-03/10/15   Right Breast  . Sarcoid    per pt , not sure  . Schatzki's ring   . Shortness of breath   . Sleep apnea   . Stroke [facility]) 2013   tia/ pt feels it was around 2008 0r 2009  . Takotsubo syndrome   . Tubular adenoma of colon   . Unspecified transient cerebral ischemia   . Unspecified vitamin D deficiency     Family History  Problem Relation Age of Onset  . Diabetes Mother   . Alzheimer's disease Mother   . Hypertension Mother   . Obesity Mother   . Other Brother        Thyroid  problem 10/2016  . Allergies Sister   . Heart disease Sister   . Heart disease Brother   . Prostate cancer Brother 29       same brother as throat cancer  . Throat cancer Brother        dx in his 58s; also a smoker  . Heart disease Father   . Cancer Maternal Grandmother 36       colon cancer or abdominal cancer  . Lung cancer Maternal Grandfather 24  . Breast cancer Maternal Aunt 57  . Colon cancer Maternal Aunt 61       same sister as breast at 75  . Lung cancer Maternal Uncle   . Breast cancer Maternal Aunt        dx in her 14s  . Pancreatic cancer Cousin 11       maternal first cousin  . Breast cancer Cousin        paternal first cousin twice removed died in her 20s  . Heart disease Son        Cardiac  Arrest 07/2016  . Stroke Neg Hx    Past Surgical History:  Procedure Laterality Date  . ABI  2006   normal  . BREAST EXCISIONAL BIOPSY    . BREAST LUMPECTOMY WITH RADIOACTIVE SEED LOCALIZATION Right 01/03/2015   Procedure: BREAST LUMPECTOMY WITH RADIOACTIVE SEED LOCALIZATION;  Surgeon: Autumn Messing III, MD;  Location: [place];  Service: General;  Laterality: Right;  . CARDIAC DEFIBRILLATOR PLACEMENT  2006; 2012   BSX single chamber ICD  . carotid dopplers  2006   neg  . CEREBRAL ANEURYSM REPAIR  02/1999  . COLONOSCOPY    . HAMMER TOE SURGERY  11/15/2020   3rd digit bilateral feet   . hospitalization  2004   GI bleed, PUD, diverticulosis (EGD,colonscopy)  . hospitalization     PE, NSVT, s/p defib  . KNEE ARTHROSCOPY     bilateral  . LEFT HEART CATHETERIZATION WITH CORONARY ANGIOGRAM N/A 02/22/2012   Procedure: LEFT HEART CATHETERIZATION WITH CORONARY ANGIOGRAM;  Surgeon: Burnell Blanks, MD;  Location[facility] C[facility] LAB;  Service: Cardiovascular;  Laterality: N/A;  . PARTIAL HYSTERECTOMY     Fibroids  . TONGUE BIOPSY  12/12/2017   due to sore tongue and white patches/abnormal cells  . TOTAL KNEE ARTHROPLASTY Left 02/13/2016   Procedure: TOTAL KNEE ARTHROPLASTY;  Surgeon:  Vickey Huger, MD;  Location: [place];  Service: Orthopedics;  Laterality: Left;  . UPPER GASTROINTESTINAL ENDOSCOPY     Social History   Social History Narrative   Lives alone   Caffeine YNW:GNFA   Retired from [employer]   2 children -- 5 grandbabies   Live alone   Immunization History  Administered Date(s) Administered  . Fluad Quad(high Dose 65+) 04/17/2019, 04/30/2020  . H1N1 07/24/2008  . Influenza Split 07/17/2011, 09/27/2011, 04/29/2012  . Influenza Whole 07/20/2004, 07/04/2007, 05/07/2008, 05/19/2009, 05/17/2010  . Influenza, High Dose Seasonal PF 05/30/2017, 05/21/2018  . Influenza,inj,Quad PF,6+ Mos 05/20/2013, 05/06/2014, 05/17/2015, 05/15/2016  . Influenza-Unspecified 05/07/2018  . PFIZER(Purple Top)SARS-COV-2 Vaccination 09/25/2019, 10/16/2019, 06/04/2020  . Pneumococcal Conjugate-13 11/22/2015  . Pneumococcal Polysaccharide-23 05/20/2002, 05/07/2008, 06/25/2014  . Td 09/07/2009  . Zoster 09/21/2011     Objective: Vital Signs: BP (!) 144/82 (BP Location: Left Arm, Patient Position: Sitting, Cuff Size: Normal)   Pulse 79   Resp 16   Ht 5' 6.5" (1.689 m)   Wt 220 lb (99.8 kg)   LMP  (LMP Unknown)   BMI 34.98 kg/m    Physical Exam Vitals and nursing note reviewed.  Constitutional:      Appearance: She is well-developed.  HENT:     Head: Normocephalic and atraumatic.  Eyes:     Conjunctiva/sclera: Conjunctivae normal.  Cardiovascular:     Rate and Rhythm: Normal rate and regular rhythm.     Heart sounds: Normal heart sounds.  Pulmonary:     Effort: Pulmonary effort is normal.     Breath sounds: Normal breath sounds.  Abdominal:     General: Bowel sounds are normal.     Palpations: Abdomen is soft.  Musculoskeletal:     Cervical back: Normal range of motion.  Lymphadenopathy:     Cervical: No cervical adenopathy.  Skin:    General: Skin is warm and dry.     Capillary Refill: Capillary refill takes less than 2 seconds.  Neurological:     Mental  Status: She is alert and oriented to person, place, and time.  Psychiatric:        Behavior: Behavior normal.  Musculoskeletal Exam: She has limited range of motion of the cervical spine.  Shoulder joints, elbow joints, wrist joints with good range of motion.  She had no DIP and PIP thickening.  Hip joints with good range of motion.  She has some tenderness over right trochanteric bursa.  She had discomfort range of motion of her right knee joint without any warmth swelling effusion.  She had no tenderness over ankles or MTPs.  CDAI Exam: CDAI Score: -- Patient Global: --; Provider Global: -- Swollen: --; Tender: -- Joint Exam 12/14/2020   No joint exam has been documented for this visit   There is currently no information documented on the homunculus. Go to the Rheumatology activity and complete the homunculus joint exam.  Investigation: No additional findings.  Imaging: DG Foot 2 Views Left  Result Date: 11/28/2020 Please see detailed radiograph report in office note.  DG Foot 2 Views Right  Result Date: 11/28/2020 Please see detailed radiograph report in office note.  CUP PACEART REMOTE DEVICE CHECK  Result Date: 12/01/2020 Scheduled remote reviewed. Normal device function.  R. Powers, CVRS Next remote 91 days.   Recent Labs: Lab Results  Component Value Date   WBC 3.5 (L) 12/07/2019   HGB 12.8 12/07/2019   PLT 178.0 12/07/2019   NA 138 02/16/2020   K 4.2 02/16/2020   CL 104 02/16/2020   CO2 28 02/16/2020   GLUCOSE 104 (H) 02/16/2020   BUN 21 02/16/2020   CREATININE 0.93 02/16/2020   BILITOT 0.5 12/07/2019   ALKPHOS 102 12/07/2019   AST 37 12/07/2019   ALT 41 (H) 12/07/2019   PROT 6.8 12/07/2019   ALBUMIN 4.2 12/07/2019   CALCIUM 10.0 02/16/2020   GFRAA 82 10/01/2019    Speciality Comments: Narc Agreement: 01/16/18  Procedures:  No procedures performed Allergies: Ace inhibitors, Amitriptyline hcl, Atorvastatin, Cymbalta [duloxetine hcl], Dilantin  [phenytoin sodium extended], Paroxetine, Ramipril, Rosuvastatin, Carbamazepine, Codeine, Iodine-131, Phenytoin sodium extended, and Wellbutrin [bupropion]   Assessment / Plan:     Visit Diagnoses: Fibromyalgia-she continues to have some generalized pain and discomfort.  Need for regular exercise was emphasized.  Trapezius muscle spasm -she takes baclofen 10 mg 1 tablet by mouth at bedtime as needed for muscle spasms.  Primary osteoarthritis of right knee - X-ray findings were consistent with severe osteoarthritis and severe chondromalacia patella.  She had recent cortisone injection in February which did not last for very long.  She states she had no response to [place] supplement injections.  I reviewed x-ray results.  I discussed right total knee replacement.  Patient states she is not ready but she would like to have a repeat injection.  Have advised her to schedule an appointment in May for a repeat cortisone injection.  Status post total knee replacement, left-doing well.  Primary osteoarthritis of both hands-joint protection was discussed.  Trochanteric bursitis, right hip-IT band stretches were discussed.  DDD (degenerative disc disease), cervical-she has limited range of motion.  Plantar fasciitis, right - Resolved  History of hypertension-blood pressure is mildly elevated.  Brain aneurysm  History of gastroesophageal reflux (GERD)  RLS (restless legs syndrome)  History of migraine  History of breast cancer  Hx of Clostridium difficile infection  History of depression  History of hypercholesterolemia  History of diverticulosis  History of TIA (transient ischemic attack)  History of sleep apnea  History of hypothyroidism  Orders: No orders of the defined types were placed in this encounter.  No orders of the defined types were placed  in this encounter.    Follow-Up Instructions: No follow-ups on file.   Bo Merino, MD  Note - This record has been  created using Editor, commissioning.  Chart creation errors have been sought, but may not always  have been located. Such creation errors do not reflect on  the standard of medical care.

## 2020-12-01 ENCOUNTER — Other Ambulatory Visit: Payer: Self-pay | Admitting: Interventional Cardiology

## 2020-12-01 LAB — CUP PACEART REMOTE DEVICE CHECK
Battery Remaining Longevity: 30 mo
Battery Remaining Percentage: 35 %
Brady Statistic RV Percent Paced: 0 %
Date Time Interrogation Session: 20220413030100
HighPow Impedance: 74 Ohm
Implantable Lead Implant Date: 20050603
Implantable Lead Location: 753860
Implantable Lead Model: 185
Implantable Lead Serial Number: 116340
Implantable Pulse Generator Implant Date: 20110505
Lead Channel Impedance Value: 655 Ohm
Lead Channel Pacing Threshold Amplitude: 0.7 V
Lead Channel Pacing Threshold Pulse Width: 0.4 ms
Lead Channel Setting Pacing Amplitude: 2.4 V
Lead Channel Setting Pacing Pulse Width: 0.4 ms
Lead Channel Setting Sensing Sensitivity: 0.4 mV
Pulse Gen Serial Number: 266301

## 2020-12-07 ENCOUNTER — Ambulatory Visit: Payer: Medicare Other

## 2020-12-08 ENCOUNTER — Ambulatory Visit (INDEPENDENT_AMBULATORY_CARE_PROVIDER_SITE_OTHER): Payer: Medicare Other

## 2020-12-08 DIAGNOSIS — Z0000 Encounter for general adult medical examination without abnormal findings: Secondary | ICD-10-CM | POA: Diagnosis not present

## 2020-12-08 NOTE — Patient Instructions (Addendum)
[name] , Thank you for taking time to come for your Medicare Wellness Visit. I appreciate your ongoing commitment to your health goals. Please review the following plan we discussed and let me know if I can assist you in the future.   Screening recommendations/referrals: Colonoscopy: Done 12/2017 Mammogram: Done 03/01/20 Bone Density: Done 03/01/20 Recommended yearly ophthalmology/optometry visit for glaucoma screening and checkup Recommended yearly dental visit for hygiene and checkup  Vaccinations: Influenza vaccine: Up to date Pneumococcal vaccine: Up to date Tdap vaccine: Due and discussed Shingles vaccine: Shingrix discussed. Please contact your pharmacy for coverage information.   Covid-19:Completed 2/5, 2/22, & 06/04/20  Advanced directives: Please bring a copy of your health care power of attorney and living will to the office at your convenience.  Conditions/risks identified: none at this time  Next appointment: Follow up in one year for your annual wellness visit    Preventive Care 65 Years and Older, Female Preventive care refers to lifestyle choices and visits with your health care provider that can promote health and wellness. What does preventive care include?  A yearly physical exam. This is also called an annual well check.  Dental exams once or twice a year.  Routine eye exams. Ask your health care provider how often you should have your eyes checked.  Personal lifestyle choices, including:  Daily care of your teeth and gums.  Regular physical activity.  Eating a healthy diet.  Avoiding tobacco and drug use.  Limiting alcohol use.  Practicing safe sex.  Taking low-dose aspirin every day.  Taking vitamin and mineral supplements as recommended by your health care provider. What happens during an annual well check? The services and screenings done by your health care provider during your annual well check will depend on your age, overall  health, lifestyle risk factors, and family history of disease. Counseling  Your health care provider may ask you questions about your:  Alcohol use.  Tobacco use.  Drug use.  Emotional well-being.  Home and relationship well-being.  Sexual activity.  Eating habits.  History of falls.  Memory and ability to understand (cognition).  Work and work Statistician.  Reproductive health. Screening  You may have the following tests or measurements:  Height, weight, and BMI.  Blood pressure.  Lipid and cholesterol levels. These may be checked every 5 years, or more frequently if you are over 43 years old.  Skin check.  Lung cancer screening. You may have this screening every year starting at age 52 if you have a 30-pack-year history of smoking and currently smoke or have quit within the past 15 years.  Fecal occult blood test (FOBT) of the stool. You may have this test every year starting at age 48.  Flexible sigmoidoscopy or colonoscopy. You may have a sigmoidoscopy every 5 years or a colonoscopy every 10 years starting at age 38.  Hepatitis C blood test.  Hepatitis B blood test.  Sexually transmitted disease (STD) testing.  Diabetes screening. This is done by checking your blood sugar (glucose) after you have not eaten for a while (fasting). You may have this done every 1-3 years.  Bone density scan. This is done to screen for osteoporosis. You may have this done starting at age 23.  Mammogram. This may be done every 1-2 years. Talk to your health care provider about how often you should have regular mammograms. Talk with your health care provider about your test results, treatment options, and if necessary, the need for more tests. Vaccines  Your health care provider may recommend certain vaccines, such as:  Influenza vaccine. This is recommended every year.  Tetanus, diphtheria, and acellular pertussis (Tdap, Td) vaccine. You may need a Td booster every 10  years.  Zoster vaccine. You may need this after age 72.  Pneumococcal 13-valent conjugate (PCV13) vaccine. One dose is recommended after age 14.  Pneumococcal polysaccharide (PPSV23) vaccine. One dose is recommended after age 45. Talk to your health care provider about which screenings and vaccines you need and how often you need them. This information is not intended to replace advice given to you by your health care provider. Make sure you discuss any questions you have with your health care provider. Document Released: 09/02/2015 Document Revised: 04/25/2016 Document Reviewed: 06/07/2015 Elsevier Interactive Patient Education  2017 [place] Prevention in the Home Falls can cause injuries. They can happen to people of all ages. There are many things you can do to make your home safe and to help prevent falls. What can I do on the outside of my home?  Regularly fix the edges of walkways and driveways and fix any cracks.  Remove anything that might make you trip as you walk through a door, such as a raised step or threshold.  Trim any bushes or trees on the path to your home.  Use bright outdoor lighting.  Clear any walking paths of anything that might make someone trip, such as rocks or tools.  Regularly check to see if handrails are loose or broken. Make sure that both sides of any steps have handrails.  Any raised decks and porches should have guardrails on the edges.  Have any leaves, snow, or ice cleared regularly.  Use sand or salt on walking paths during winter.  Clean up any spills in your garage right away. This includes oil or grease spills. What can I do in the bathroom?  Use night lights.  Install grab bars by the toilet and in the tub and shower. Do not use towel bars as grab bars.  Use non-skid mats or decals in the tub or shower.  If you need to sit down in the shower, use a plastic, non-slip stool.  Keep the floor dry. Clean up any water that  spills on the floor as soon as it happens.  Remove soap buildup in the tub or shower regularly.  Attach bath mats securely with double-sided non-slip rug tape.  Do not have throw rugs and other things on the floor that can make you trip. What can I do in the bedroom?  Use night lights.  Make sure that you have a light by your bed that is easy to reach.  Do not use any sheets or blankets that are too big for your bed. They should not hang down onto the floor.  Have a firm chair that has side arms. You can use this for support while you get dressed.  Do not have throw rugs and other things on the floor that can make you trip. What can I do in the kitchen?  Clean up any spills right away.  Avoid walking on wet floors.  Keep items that you use a lot in easy-to-reach places.  If you need to reach something above you, use a strong step stool that has a grab bar.  Keep electrical cords out of the way.  Do not use floor polish or wax that makes floors slippery. If you must use wax, use non-skid floor wax.  Do  not have throw rugs and other things on the floor that can make you trip. What can I do with my stairs?  Do not leave any items on the stairs.  Make sure that there are handrails on both sides of the stairs and use them. Fix handrails that are broken or loose. Make sure that handrails are as long as the stairways.  Check any carpeting to make sure that it is firmly attached to the stairs. Fix any carpet that is loose or worn.  Avoid having throw rugs at the top or bottom of the stairs. If you do have throw rugs, attach them to the floor with carpet tape.  Make sure that you have a light switch at the top of the stairs and the bottom of the stairs. If you do not have them, ask someone to add them for you. What else can I do to help prevent falls?  Wear shoes that:  Do not have high heels.  Have rubber bottoms.  Are comfortable and fit you well.  Are closed at the  toe. Do not wear sandals.  If you use a stepladder:  Make sure that it is fully opened. Do not climb a closed stepladder.  Make sure that both sides of the stepladder are locked into place.  Ask someone to hold it for you, if possible.  Clearly mark and make sure that you can see:  Any grab bars or handrails.  First and last steps.  Where the edge of each step is.  Use tools that help you move around (mobility aids) if they are needed. These include:  Canes.  Walkers.  Scooters.  Crutches.  Turn on the lights when you go into a dark area. Replace any light bulbs as soon as they burn out.  Set up your furniture so you have a clear path. Avoid moving your furniture around.  If any of your floors are uneven, fix them.  If there are any pets around you, be aware of where they are.  Review your medicines with your doctor. Some medicines can make you feel dizzy. This can increase your chance of falling. Ask your doctor what other things that you can do to help prevent falls. This information is not intended to replace advice given to you by your health care provider. Make sure you discuss any questions you have with your health care provider. Document Released: 06/02/2009 Document Revised: 01/12/2016 Document Reviewed: 09/10/2014 Elsevier Interactive Patient Education  2017 [employer].

## 2020-12-08 NOTE — Progress Notes (Signed)
Virtual Visit via Telephone Note  I connected with  Kaidence Sant on 12/08/20 at 11:00 AM EDT by telephone and verified that I am speaking with the correct person using two identifiers.  Medicare Annual Wellness visit completed telephonically due to Covid-19 pandemic.   Persons participating in this call: This Health Coach and this patient.   Location: Patient: Home Provider: Office    I discussed the limitations, risks, security and privacy concerns of performing an evaluation and management service by telephone and the availability of in person appointments. The patient expressed understanding and agreed to proceed.  Unable to perform video visit due to video visit attempted and failed and/or patient does not have video capability.   Some vital signs may be absent or patient reported.   Willette Brace, LPN    Subjective:   Laverle Pillard is a 70 y.o. female who presents for Medicare Annual (Subsequent) preventive examination.  Review of Systems     Cardiac Risk Factors include: hypertension;dyslipidemia;obesity (BMI >30kg/m2)     Objective:    Today's Vitals   12/08/20 1042  PainSc: 4    There is no height or weight on file to calculate BMI.  Advanced Directives 12/08/2020 12/07/2019 07/22/2019 12/04/2018 01/06/2018 11/27/2017 11/26/2016  Does Patient Have a Medical Advance Directive? Yes Yes Yes Yes Yes Yes Yes  Type of Paramedic of [place];Living will Healthcare Power of [place];Living will Healthcare Power of [place];Living will [place];Living will  Does patient want to make changes to medical advance directive? - - No - Patient declined - - - -  Copy of [place] in Chart? No - copy requested No - copy requested - No - copy requested - No - copy requested No - copy requested  Would patient like  information on creating a medical advance directive? - - - - - - -    Current Medications (verified) Outpatient Encounter Medications as of 12/08/2020  Medication Sig  . albuterol (PROVENTIL) (2.5 MG/3ML) 0.083% nebulizer solution USE 1 VIAL IN NEBULIZER EVERY 6 HOURS  . albuterol (VENTOLIN HFA) 108 (90 Base) MCG/ACT inhaler Inhale 2 puffs into the lungs every 6 (six) hours as needed for wheezing or shortness of breath.  . Ascorbic Acid (VITAMIN C) 1000 MG tablet Take 1,000 mg by mouth daily.  [name] aspirin 81 MG tablet Take 81 mg by mouth daily.  . baclofen (LIORESAL) 10 MG tablet TAKE 1 TABLET BY MOUTH AT BEDTIME AS NEEDED FOR MUSCLE SPASM  . BLACK CURRANT SEED OIL PO Take 1 tablet by mouth daily.  . calcium carbonate (OS-CAL - DOSED IN MG OF ELEMENTAL CALCIUM) 1250 (500 Ca) MG tablet Take 1 tablet by mouth.  . carvedilol (COREG) 12.5 MG tablet Take 1 tablet (12.5 mg total) by mouth 2 (two) times daily.  . cholecalciferol (VITAMIN D) 1000 UNITS tablet Take 5,000 Units by mouth daily.   . diclofenac Sodium (VOLTAREN) 1 % GEL Apply 2-4 grams to affected joint 4 times daily as needed.  [name] ELDERBERRY PO Take by mouth.  . fexofenadine (ALLEGRA) 180 MG tablet Take 180 mg by mouth daily as needed for allergies or rhinitis.  [name] FLUoxetine (PROZAC) 10 MG capsule Take 1 capsule (10 mg total) by mouth daily.  . fluticasone (FLONASE) 50 MCG/ACT nasal spray Place 2 sprays into both nostrils daily as needed for allergies or rhinitis.  . furosemide (LASIX)  40 MG tablet Take 20 mg by mouth daily. 20-40mg   . hydroxypropyl methylcellulose / hypromellose (ISOPTO TEARS / GONIOVISC) 2.5 % ophthalmic solution Place 1 drop into both eyes daily.  [name] latanoprost (XALATAN) 0.005 % ophthalmic solution Place 1 drop into both eyes at bedtime.  [name] levothyroxine (SYNTHROID) 50 MCG tablet Take 1 tablet (50 mcg total) by mouth daily.  . magnesium oxide (MAG-OX) 400 MG tablet Take 1 tablet (400 mg total) by mouth in the morning, at  noon, and at bedtime.  . mometasone (ELOCON) 0.1 % lotion Apply 1 application topically daily as needed (rash).   . montelukast (SINGULAIR) 10 MG tablet TAKE 1 TABLET AT BEDTIME  . Multiple Vitamins-Minerals (MULTIVITAMIN PO) Take 1 tablet by mouth daily.  . pantoprazole (PROTONIX) 40 MG tablet Take 1 tablet (40 mg total) by mouth daily. Please schedule an office visit for further refills. Thank you  . polyethylene glycol powder (GLYCOLAX/MIRALAX) powder Take 17 grams by mouth daily  . potassium chloride (KLOR-CON) 10 MEQ tablet TAKE 1/2 TABLET EVERY DAY  . Respiratory Therapy Supplies (FLUTTER) DEVI 1 Device by Does not apply route as needed.  . simvastatin (ZOCOR) 40 MG tablet TAKE 1/2 TABLET EVERY DAY. Please make overdue appt with Dr. Caryl Comes before anymore refills. Thank you 1st attempt  . sodium chloride HYPERTONIC 3 % nebulizer solution Take by nebulization 2 (two) times daily as needed for cough. 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  . traMADol (ULTRAM) 50 MG tablet Take 1 tablet (50 mg total) by mouth 2 (two) times daily.  [name] zinc gluconate 50 MG tablet Take 50 mg by mouth daily.  [name] zolpidem (AMBIEN) 10 MG tablet Take 1 tablet (10 mg total) by mouth at bedtime as needed for sleep.  . nitroGLYCERIN (NITROSTAT) 0.4 MG SL tablet Place 1 tablet (0.4 mg total) under the tongue every 5 (five) minutes as needed.   No facility-administered encounter medications on file as of 12/08/2020.    Allergies (verified) Ace inhibitors, Amitriptyline hcl, Atorvastatin, Cymbalta [duloxetine hcl], Dilantin [phenytoin sodium extended], Paroxetine, Ramipril, Rosuvastatin, Carbamazepine, Codeine, Iodine-131, Phenytoin sodium extended, and Wellbutrin [bupropion]   History: Past Medical History:  Diagnosis Date  . AICD (automatic cardioverter/defibrillator) present   . Anxiety   . Arthritis   . Back pain   . 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 Bronchiectasis ([place])   . Cerebral aneurysm,  nonruptured    had a clip put in  . CHF (congestive heart failure) ([place])   . Clostridium difficile infection   . Depressive disorder, not elsewhere classified   . Diverticulosis of colon (without mention of hemorrhage)   . Esophageal reflux   . Fatty liver   . Fibromyalgia   . Gastritis   . GERD (gastroesophageal reflux disease)   . GI bleed 2004  . Glaucoma   . Hiatal hernia   . Hyperlipidemia   . Hypertension   . Hypothyroidism   . Internal hemorrhoids   . Joint pain   . Obstructive sleep apnea (adult) (pediatric)   . Osteoarthritis   . Ostium secundum type atrial septal defect   . Other chronic nonalcoholic liver disease   . Other pulmonary embolism and infarction   . Palpitations   . Paroxysmal ventricular tachycardia ([place])   . Personal history of radiation therapy   . Pneumonia    history  . Presence of permanent cardiac pacemaker   . PUD (peptic ulcer disease)   . Radiation 02/03/15-03/10/15  Right Breast  . Sarcoid    per pt , not sure  . Schatzki's ring   . Shortness of breath   . Sleep apnea   . Stroke [facility]) 2013   tia/ pt feels it was around 2008 0r 2009  . Takotsubo syndrome   . Tubular adenoma of colon   . Unspecified transient cerebral ischemia   . Unspecified vitamin D deficiency    Past Surgical History:  Procedure Laterality Date  . ABI  2006   normal  . BREAST EXCISIONAL BIOPSY    . BREAST LUMPECTOMY WITH RADIOACTIVE SEED LOCALIZATION Right 01/03/2015   Procedure: BREAST LUMPECTOMY WITH RADIOACTIVE SEED LOCALIZATION;  Surgeon: Autumn Messing III, MD;  Location: [place];  Service: General;  Laterality: Right;  . CARDIAC DEFIBRILLATOR PLACEMENT  2006; 2012   BSX single chamber ICD  . carotid dopplers  2006   neg  . CEREBRAL ANEURYSM REPAIR  02/1999  . COLONOSCOPY    . hospitalization  2004   GI bleed, PUD, diverticulosis (EGD,colonscopy)  . hospitalization     PE, NSVT, s/p defib  . KNEE ARTHROSCOPY     bilateral  . LEFT HEART CATHETERIZATION WITH  CORONARY ANGIOGRAM N/A 02/22/2012   Procedure: LEFT HEART CATHETERIZATION WITH CORONARY ANGIOGRAM;  Surgeon: Burnell Blanks, MD;  Location[facility] CATH LAB;  Service: Cardiovascular;  Laterality: N/A;  . PARTIAL HYSTERECTOMY     Fibroids  . TONGUE BIOPSY  12/12/2017   due to sore tongue and white patches/abnormal cells  . TOTAL KNEE ARTHROPLASTY Left 02/13/2016   Procedure: TOTAL KNEE ARTHROPLASTY;  Surgeon: Vickey Huger, MD;  Location[place];  Service: Orthopedics;  Laterality: Left;  . UPPER GASTROINTESTINAL ENDOSCOPY     Family History  Problem Relation Age of Onset  . Diabetes Mother   . Alzheimer's disease Mother   . Hypertension Mother   . Obesity Mother   . Other Brother        Thyroid problem 10/2016  . Allergies Sister   . Heart disease Sister   . Heart disease Brother   . Prostate cancer Brother 71       same brother as throat cancer  . Throat cancer Brother        dx in his 63s; also a smoker  . Heart disease Father   . Cancer Maternal Grandmother 13       colon cancer or abdominal cancer  . Lung cancer Maternal Grandfather 55  . Breast cancer Maternal Aunt 92  . Colon cancer Maternal Aunt 94       same sister as breast at 14  . Lung cancer Maternal Uncle   . Breast cancer Maternal Aunt        dx in her 54s  . Pancreatic cancer Cousin 70       maternal first cousin  . Breast cancer Cousin        paternal first cousin twice removed died in her 29s  . Heart disease Son        Cardiac Arrest 07/2016  . Stroke Neg Hx    Social History   Socioeconomic History  . Marital status: Divorced    Spouse name: Not on file  . Number of children: 2  . Years of education: 29  . Highest education level: Not on file  Occupational History  . Occupation: DISABLED  Tobacco Use  . Smoking status: Former Smoker    Packs/day: 1.00    Years: 15.00    Pack  years: 15.00    Types: Cigarettes    Quit date: 08/20/1989    Years since quitting: 31.3  . Smokeless tobacco: Never  Used  . Tobacco comment: Started at 55; less than 1 PPD  Vaping Use  . Vaping Use: Never used  Substance and Sexual Activity  . Alcohol use: Yes    Alcohol/week: 0.0 standard drinks    Comment: rare  . Drug use: No  . Sexual activity: Not Currently  Other Topics Concern  . Not on file  Social History Narrative   Lives alone   Caffeine OQH:UTML   Retired from [employer]   2 children -- 5 grandbabies   Live alone   Social Determinants of Health   Financial Resource Strain: [place]   . Difficulty of Paying Living Expenses: Not hard at all  Food Insecurity: No Food Insecurity  . Worried About Charity fundraiser in the Last Year: Never true  . Ran Out of Food in the Last Year: Never true  Transportation Needs: No Transportation Needs  . Lack of Transportation (Medical): No  . Lack of Transportation (Non-Medical): No  Physical Activity: Inactive  . Days of Exercise per Week: 0 days  . Minutes of Exercise per Session: 0 min  Stress: No Stress Concern Present  . Feeling of Stress : Not at all  Social Connections: Moderately Isolated  . Frequency of Communication with Friends and Family: More than three times a week  . Frequency of Social Gatherings with Friends and Family: More than three times a week  . Attends Religious Services: 1 to 4 times per year  . Active Member of Clubs or Organizations: No  . Attends Archivist Meetings: Never  . Marital Status: Divorced    Tobacco Counseling Counseling given: Not Answered Comment: Started at 39; less than 1 PPD   Clinical Intake:  Pre-visit preparation completed: Yes  Pain : 0-10 (right knee and both shoulders) Pain Score: 4  Pain Type: Chronic pain Pain Location: Shoulder Pain Descriptors / Indicators: Dull Pain Onset: More than a month ago Pain Frequency: Intermittent     BMI - recorded: 34.23 Nutritional Status: BMI > 30  Obese Nutritional Risks: None Diabetes: No  How often do you need to have  someone help you when you read instructions, pamphlets, or other written materials from your doctor or pharmacy?: 1 - Never  Diabetic?No  Interpreter Needed?: No  Information entered by :: Charlott Rakes, LPN   Activities of Daily Living In your present state of health, do you have any difficulty performing the following activities: 12/08/2020  Hearing? N  Vision? N  Difficulty concentrating or making decisions? Y  Comment forget at times  Walking or climbing stairs? Y  Dressing or bathing? N  Doing errands, shopping? N  Preparing Food and eating ? N  Using the Toilet? N  In the past six months, have you accidently leaked urine? Y  Comment wears a brief or pad  Do you have problems with loss of bowel control? N  Managing your Medications? N  Managing your Finances? N  Housekeeping or managing your Housekeeping? N  Some recent data might be hidden    Patient Care Team: Inda Coke, [place] as PCP - General (Physician Assistant) Deboraha Sprang, MD as PCP - Electrophysiology (Cardiology) Jettie Booze, MD as PCP - Cardiology (Cardiology) Jovita Kussmaul, MD as Consulting Physician (General Surgery) Truitt Merle, MD as Consulting Physician (Hematology) Thea Silversmith, MD as  Consulting Physician (Radiation Oncology) Rockwell Germany, RN as Registered Nurse Mauro Kaufmann, RN as Registered Nurse Deboraha Sprang, MD as Consulting Physician (Cardiology) Holley Bouche, NP (Inactive) as Nurse Practitioner (Nurse Practitioner) Sylvan Cheese, NP as Nurse Practitioner (Nurse Practitioner) Chesley Mires, MD as Consulting Physician (Pulmonary Disease) Jovita Kussmaul, MD as Consulting Physician (General Surgery) Bo Merino, MD as Consulting Physician (Rheumatology)  Indicate any recent Medical Services you may have received from other than [facility] providers in the past year (date may be approximate).     Assessment:   This is a routine wellness examination  for [place].  Hearing/Vision screen  Hearing Screening   125Hz  250Hz  500Hz  1000Hz  2000Hz  3000Hz  4000Hz  6000Hz  8000Hz   Right ear:           Left ear:           Comments: Pt denies any hearing issues   Vision Screening Comments: Pt follows up with dr Einar Gip  for annual eye exams   Dietary issues and exercise activities discussed: Current Exercise Habits: The patient does not participate in regular exercise at present  Goals    . Patient Stated     12/07/2019, I will start back going to the [facility] to work out.     . Patient Stated     Lose weight     . Weight < 176 lb (79.833 kg)     Starting 12/04/2018, I will attempt to reduce all added sugar in diet and increase exercise for up to 90 min 3 days per week.       Depression Screen PHQ 2/9 Scores 12/08/2020 10/03/2020 08/26/2020 07/01/2020 01/04/2020 12/21/2019 12/07/2019  PHQ - 2 Score 0 0 0 0 0 1 0  PHQ- 9 Score - - 4 - - 3 0    Fall Risk Fall Risk  12/08/2020 10/03/2020 07/01/2020 03/28/2020 02/01/2020  Falls in the past year? 0 0 0 0 0  Number falls in past yr: 0 0 0 - -  Comment - - - - -  Injury with Fall? 0 0 0 - -  Risk for fall due to : Impaired vision - - - -  Risk for fall due to: Comment - - - - -  Follow up Falls prevention discussed - - - -    FALL RISK PREVENTION PERTAINING TO THE HOME:  Any stairs in or around the home? Yes  If so, are there any without handrails? No  Home free of loose throw rugs in walkways, pet beds, electrical cords, etc? Yes  Adequate lighting in your home to reduce risk of falls? Yes   ASSISTIVE DEVICES UTILIZED TO PREVENT FALLS:  Life alert? No  Use of a cane, walker or w/c? Yes  Grab bars in the bathroom? No  Shower chair or bench in shower? No  Elevated toilet seat or a handicapped toilet? No   TIMED UP AND GO:  Was the test performed? No      Cognitive Function: MMSE - Mini Mental State Exam 12/07/2019 12/04/2018 11/27/2017 11/26/2016 11/22/2015  Not completed: - Unable to complete - -  -  Orientation to time 5 - 5 5 5   Orientation to Place 5 - 5 5 5   Registration 3 - 3 3 3   Attention/ Calculation 5 - 0 0 0  Recall 3 - 3 3 3   Language- name 2 objects - - 0 0 0  Language- repeat 1 - 1 1 1   Language- follow 3 step  command - - 3 3 3   Language- read & follow direction - - 0 0 0  Write a sentence - - 0 0 0  Copy design - - 0 0 0  Total score - - 20 20 20      6CIT Screen 12/08/2020  What Year? 0 points  What month? 0 points  Count back from 20 0 points  Months in reverse 0 points  Repeat phrase 0 points    Immunizations Immunization History  Administered Date(s) Administered  . Fluad Quad(high Dose 65+) 04/17/2019, 04/30/2020  . H1N1 07/24/2008  . Influenza Split 07/17/2011, 09/27/2011, 04/29/2012  . Influenza Whole 07/20/2004, 07/04/2007, 05/07/2008, 05/19/2009, 05/17/2010  . Influenza, High Dose Seasonal PF 05/30/2017, 05/21/2018  . Influenza,inj,Quad PF,6+ Mos 05/20/2013, 05/06/2014, 05/17/2015, 05/15/2016  . Influenza-Unspecified 05/07/2018  . PFIZER(Purple Top)SARS-COV-2 Vaccination 09/25/2019, 10/16/2019, 06/04/2020  . Pneumococcal Conjugate-13 11/22/2015  . Pneumococcal Polysaccharide-23 05/20/2002, 05/07/2008, 06/25/2014  . Td 09/07/2009  . Zoster 09/21/2011    TDAP status: Due, Education has been provided regarding the importance of this vaccine. Advised may receive this vaccine at local pharmacy or Health Dept. Aware to provide a copy of the vaccination record if obtained from local pharmacy or Health Dept. Verbalized acceptance and understanding.  Flu Vaccine status: Up to date  Pneumococcal vaccine status: Up to date  Covid-19 vaccine status: Completed vaccines  Qualifies for Shingles Vaccine? Yes   Zostavax completed Yes   Shingrix Completed?: No.    Education has been provided regarding the importance of this vaccine. Patient has been advised to call insurance company to determine out of pocket expense if they have not yet received this  vaccine. Advised may also receive vaccine at local pharmacy or Health Dept. Verbalized acceptance and understanding.  Screening Tests Health Maintenance  Topic Date Due  . TETANUS/TDAP  09/08/2019  . COVID-19 Vaccine (4 - Booster for Pfizer series) 12/03/2020  . MAMMOGRAM  03/01/2021  . INFLUENZA VACCINE  03/20/2021  . COLONOSCOPY (Pts 45-41yrs Insurance coverage will need to be confirmed)  01/07/2023  . DEXA SCAN  Completed  . Hepatitis C Screening  Completed  . PNA vac Low Risk Adult  Completed  . HPV VACCINES  Aged Out    Health Maintenance  Health Maintenance Due  Topic Date Due  . TETANUS/TDAP  09/08/2019  . COVID-19 Vaccine (4 - Booster for Pfizer series) 12/03/2020    Colorectal cancer screening: Type of screening: Colonoscopy. Completed 01/06/18. Repeat every 5 years  Mammogram status: Completed 03/01/20. Repeat every year  Bone Density status: Completed 03/01/20. Results reflect: Bone density results: NORMAL. Repeat every 5 years.   Additional Screening:  Hepatitis C Screening:  Completed 12/02/15  Vision Screening: Recommended annual ophthalmology exams for early detection of glaucoma and other disorders of the eye. Is the patient up to date with their annual eye exam?  Yes  Who is the provider or what is the name of the office in which the patient attends annual eye exams? Dr Einar Gip  If pt is not established with a provider, would they like to be referred to a provider to establish care? No .   Dental Screening: Recommended annual dental exams for proper oral hygiene  Community Resource Referral / Chronic Care Management: CRR required this visit?  No   CCM required this visit?  No      Plan:     I have personally reviewed and noted the following in the patient's chart:   . Medical and social history .  Use of alcohol, tobacco or illicit drugs  . Current medications and supplements . Functional ability and status . Nutritional status . Physical  activity . Advanced directives . List of other physicians . Hospitalizations, surgeries, and ER visits in previous 12 months . Vitals . Screenings to include cognitive, depression, and falls . Referrals and appointments  In addition, I have reviewed and discussed with patient certain preventive protocols, quality metrics, and best practice recommendations. A written personalized care plan for preventive services as well as general preventive health recommendations were provided to patient.     Willette Brace, LPN   [id number]   Nurse Notes: none

## 2020-12-14 ENCOUNTER — Other Ambulatory Visit: Payer: Self-pay

## 2020-12-14 ENCOUNTER — Ambulatory Visit (INDEPENDENT_AMBULATORY_CARE_PROVIDER_SITE_OTHER): Payer: Medicare Other | Admitting: Podiatry

## 2020-12-14 ENCOUNTER — Encounter (INDEPENDENT_AMBULATORY_CARE_PROVIDER_SITE_OTHER): Payer: Self-pay | Admitting: Family Medicine

## 2020-12-14 ENCOUNTER — Ambulatory Visit (INDEPENDENT_AMBULATORY_CARE_PROVIDER_SITE_OTHER): Payer: Medicare Other | Admitting: Rheumatology

## 2020-12-14 ENCOUNTER — Encounter: Payer: Self-pay | Admitting: Rheumatology

## 2020-12-14 VITALS — BP 144/82 | HR 79 | Resp 16 | Ht 66.5 in | Wt 220.0 lb

## 2020-12-14 DIAGNOSIS — M1711 Unilateral primary osteoarthritis, right knee: Secondary | ICD-10-CM | POA: Diagnosis not present

## 2020-12-14 DIAGNOSIS — M797 Fibromyalgia: Secondary | ICD-10-CM | POA: Diagnosis not present

## 2020-12-14 DIAGNOSIS — Z8669 Personal history of other diseases of the nervous system and sense organs: Secondary | ICD-10-CM

## 2020-12-14 DIAGNOSIS — M19042 Primary osteoarthritis, left hand: Secondary | ICD-10-CM

## 2020-12-14 DIAGNOSIS — Z8679 Personal history of other diseases of the circulatory system: Secondary | ICD-10-CM

## 2020-12-14 DIAGNOSIS — M722 Plantar fascial fibromatosis: Secondary | ICD-10-CM

## 2020-12-14 DIAGNOSIS — I671 Cerebral aneurysm, nonruptured: Secondary | ICD-10-CM

## 2020-12-14 DIAGNOSIS — Z8639 Personal history of other endocrine, nutritional and metabolic disease: Secondary | ICD-10-CM

## 2020-12-14 DIAGNOSIS — Z8619 Personal history of other infectious and parasitic diseases: Secondary | ICD-10-CM

## 2020-12-14 DIAGNOSIS — Z8719 Personal history of other diseases of the digestive system: Secondary | ICD-10-CM

## 2020-12-14 DIAGNOSIS — Z96652 Presence of left artificial knee joint: Secondary | ICD-10-CM | POA: Diagnosis not present

## 2020-12-14 DIAGNOSIS — G2581 Restless legs syndrome: Secondary | ICD-10-CM

## 2020-12-14 DIAGNOSIS — Z9889 Other specified postprocedural states: Secondary | ICD-10-CM

## 2020-12-14 DIAGNOSIS — M62838 Other muscle spasm: Secondary | ICD-10-CM | POA: Diagnosis not present

## 2020-12-14 DIAGNOSIS — Z853 Personal history of malignant neoplasm of breast: Secondary | ICD-10-CM

## 2020-12-14 DIAGNOSIS — Z8673 Personal history of transient ischemic attack (TIA), and cerebral infarction without residual deficits: Secondary | ICD-10-CM

## 2020-12-14 DIAGNOSIS — M19041 Primary osteoarthritis, right hand: Secondary | ICD-10-CM

## 2020-12-14 DIAGNOSIS — M7061 Trochanteric bursitis, right hip: Secondary | ICD-10-CM

## 2020-12-14 DIAGNOSIS — M5030 Other cervical disc degeneration, unspecified cervical region: Secondary | ICD-10-CM

## 2020-12-14 DIAGNOSIS — G4709 Other insomnia: Secondary | ICD-10-CM

## 2020-12-14 DIAGNOSIS — Z8659 Personal history of other mental and behavioral disorders: Secondary | ICD-10-CM

## 2020-12-14 MED ORDER — ZOLPIDEM TARTRATE 10 MG PO TABS: 10.0000 mg | ORAL_TABLET | Freq: Every evening | ORAL | 0 refills | Status: DC | PRN

## 2020-12-14 NOTE — Progress Notes (Signed)
Patient in office today for suture removal from the 3rd digit bilateral. Sutures removed without complication and site was covered with band aid per patient's request. Patient denies nausea, vomiting, fever and chills at this time. Advised the patient to call the office with any questions or concerns. Patient verbalized understanding. Follow-up with Dr. Paulla Dolly in 2 weeks.

## 2020-12-14 NOTE — Telephone Encounter (Signed)
Pt last seen by Dr. Wallace.  

## 2020-12-15 NOTE — Progress Notes (Signed)
Remote ICD transmission.   

## 2020-12-20 ENCOUNTER — Other Ambulatory Visit (INDEPENDENT_AMBULATORY_CARE_PROVIDER_SITE_OTHER): Payer: Medicare Other

## 2020-12-20 ENCOUNTER — Other Ambulatory Visit: Payer: Self-pay | Admitting: Physician Assistant

## 2020-12-20 ENCOUNTER — Other Ambulatory Visit: Payer: Self-pay

## 2020-12-20 DIAGNOSIS — E7849 Other hyperlipidemia: Secondary | ICD-10-CM

## 2020-12-20 DIAGNOSIS — Z6835 Body mass index (BMI) 35.0-35.9, adult: Secondary | ICD-10-CM | POA: Diagnosis not present

## 2020-12-20 DIAGNOSIS — E038 Other specified hypothyroidism: Secondary | ICD-10-CM | POA: Diagnosis not present

## 2020-12-20 DIAGNOSIS — I10 Essential (primary) hypertension: Secondary | ICD-10-CM | POA: Diagnosis not present

## 2020-12-20 DIAGNOSIS — Z0000 Encounter for general adult medical examination without abnormal findings: Secondary | ICD-10-CM

## 2020-12-20 LAB — BASIC METABOLIC PANEL
BUN: 15 mg/dL (ref 6–23)
CO2: 27 mEq/L (ref 19–32)
Calcium: 9.7 mg/dL (ref 8.4–10.5)
Chloride: 105 mEq/L (ref 96–112)
Creatinine, Ser: 0.86 mg/dL (ref 0.40–1.20)
GFR: 67.59 mL/min (ref >60.00)
Glucose, Bld: 94 mg/dL (ref 70–99)
Potassium: 4.2 mEq/L (ref 3.5–5.1)
Sodium: 140 mEq/L (ref 135–145)

## 2020-12-20 LAB — CBC WITH DIFFERENTIAL/PLATELET
Basophils Absolute: 0 10*3/uL (ref 0.0–0.1)
Basophils Relative: 1.2 % (ref 0.0–3.0)
Eosinophils Absolute: 0.1 10*3/uL (ref 0.0–0.7)
Eosinophils Relative: 4 % (ref 0.0–5.0)
HCT: 38.6 % (ref 36.0–46.0)
Hemoglobin: 12.6 g/dL (ref 12.0–15.0)
Lymphocytes Relative: 26.3 % (ref 12.0–46.0)
Lymphs Abs: 0.9 10*3/uL (ref 0.7–4.0)
MCHC: 32.7 g/dL (ref 30.0–36.0)
MCV: 92.8 fl (ref 78.0–100.0)
Monocytes Absolute: 0.3 10*3/uL (ref 0.1–1.0)
Monocytes Relative: 8.2 % (ref 3.0–12.0)
Neutro Abs: 2.1 10*3/uL (ref 1.4–7.7)
Neutrophils Relative %: 60.3 % (ref 43.0–77.0)
Platelets: 190 10*3/uL (ref 150.0–400.0)
RBC: 4.16 Mil/uL (ref 3.87–5.11)
RDW: 13.2 % (ref 11.5–15.5)
WBC: 3.5 10*3/uL — ABNORMAL LOW (ref 4.0–10.5)

## 2020-12-20 LAB — LIPID PANEL
Cholesterol: 178 mg/dL (ref 0–200)
HDL: 59.3 mg/dL (ref >39.00)
LDL Cholesterol: 100 mg/dL — ABNORMAL HIGH (ref 0–99)
NonHDL: 118.68
Total CHOL/HDL Ratio: 3
Triglycerides: 92 mg/dL (ref 0.0–149.0)
VLDL: 18.4 mg/dL (ref 0.0–40.0)

## 2020-12-20 LAB — TSH: TSH: 4.53 u[IU]/mL — ABNORMAL HIGH (ref 0.35–4.50)

## 2020-12-20 MED ORDER — LEVOTHYROXINE SODIUM 75 MCG PO TABS: 75.0000 ug | ORAL_TABLET | Freq: Every day | ORAL | 1 refills | Status: DC

## 2020-12-20 NOTE — Addendum Note (Signed)
Addended by: Doran Clay A on: 12/20/2020 10:45 AM   Modules accepted: Orders

## 2020-12-21 ENCOUNTER — Encounter: Payer: Self-pay | Admitting: Physician Assistant

## 2020-12-21 ENCOUNTER — Ambulatory Visit (INDEPENDENT_AMBULATORY_CARE_PROVIDER_SITE_OTHER): Payer: Medicare Other | Admitting: Physician Assistant

## 2020-12-21 VITALS — BP 118/60 | HR 66 | Ht 66.0 in | Wt 220.8 lb

## 2020-12-21 DIAGNOSIS — I472 Ventricular tachycardia, unspecified: Secondary | ICD-10-CM

## 2020-12-21 DIAGNOSIS — Z95810 Presence of automatic (implantable) cardiac defibrillator: Secondary | ICD-10-CM

## 2020-12-21 DIAGNOSIS — I428 Other cardiomyopathies: Secondary | ICD-10-CM

## 2020-12-21 DIAGNOSIS — I10 Essential (primary) hypertension: Secondary | ICD-10-CM

## 2020-12-21 LAB — CUP PACEART INCLINIC DEVICE CHECK
Date Time Interrogation Session: 20220504163032
Implantable Lead Implant Date: 20050603
Implantable Lead Location: 753860
Implantable Lead Model: 185
Implantable Lead Serial Number: 116340
Implantable Pulse Generator Implant Date: 20110505
Lead Channel Pacing Threshold Amplitude: 0.6 V
Lead Channel Pacing Threshold Pulse Width: 0.4 ms
Lead Channel Sensing Intrinsic Amplitude: 10.3 mV
Pulse Gen Serial Number: 266301

## 2020-12-21 MED ORDER — SIMVASTATIN 40 MG PO TABS
ORAL_TABLET | ORAL | 3 refills | Status: DC
Start: 2020-12-21 — End: 2020-12-21

## 2020-12-21 MED ORDER — CARVEDILOL 12.5 MG PO TABS: 12.5000 mg | ORAL_TABLET | Freq: Two times a day (BID) | ORAL | 3 refills | Status: DC

## 2020-12-21 MED ORDER — SIMVASTATIN 40 MG PO TABS: ORAL_TABLET | ORAL | 3 refills | Status: DC

## 2020-12-21 NOTE — Patient Instructions (Addendum)
Medication Instructions:   Your physician recommends that you continue on your current medications as directed. Please refer to the Current Medication list given to you today.  *If you need a refill on your cardiac medications before your next appointment, please call your pharmacy*   Lab Work: NONE ORDERED  TODAY   If you have labs (blood work) drawn today and your tests are completely normal, you will receive your results only by: . MyChart Message (if you have MyChart) OR . A paper copy in the mail If you have any lab test that is abnormal or we need to change your treatment, we will call you to review the results.   Testing/Procedures: NONE ORDERED  TODAY   Follow-Up: At [facility], you and your health needs are our priority.  As part of our continuing mission to provide you with exceptional heart care, we have created designated Provider Care Teams.  These Care Teams include your primary Cardiologist (physician) and Advanced Practice Providers (APPs -  Physician Assistants and Nurse Practitioners) who all work together to provide you with the care you need, when you need it.  We recommend signing up for the patient portal called "MyChart".  Sign up information is provided on this After Visit Summary.  MyChart is used to connect with patients for Virtual Visits (Telemedicine).  Patients are able to view lab/test results, encounter notes, upcoming appointments, etc.  Non-urgent messages can be sent to your provider as well.   To learn more about what you can do with MyChart, go to [email or web address].    Your next appointment:   1 year(s)  The format for your next appointment:   In Person  Provider:   Steven Klein, MD   Other Instructions   

## 2020-12-21 NOTE — Progress Notes (Signed)
Cardiology Office Note Date:  12/21/2020  Patient ID:  [name], [name] 11/09/1950, MRN [id number] PCP:  Inda Coke, PA  Cardiologist:  Dr. Irish Lack Electrophysiologist: Dr. Caryl Comes Pulmonology: Dr. Halford Chessman Rheumatology: Dr.     Laurel Dimmer Complaint: annual visit  History of Present Illness: [name] is a 70 y.o. female with history of NICM, VT,  There is ? Of cardiac sarcoidosis by Dr. Olin Pia note,  ICD, non-obs CAD by cath 2006, ophthalmic artery aneurysm repair, HTN, morbid obesity, OSA w/CPAP, bronchiectasis (Dr. Halford Chessman), fibromyalgia, OA, HTN, HLD, hypothyroidism, anxiety, GERD  Lengthy PMHx/problem list is reviewed  She comes in today to be seen for Dr. Caryl Comes, last seen by him 2020, most recently for EP, she saw A. [place], [place] March 2021, hx of PVS, ? Slow VT empirically on Mag ox, recently with an uptick in palpitations, possible 2/2 prednisone taper, planned to monitor/follow as she finished the steroid treatment.  More recently she saw Dr. Irish Lack in Sept 2021, note mentins h/o palpitations mostly associated with PVCs on a monitor also brief SVT, longest 7 beats. Most recent echo noted recovered LVEF. She was doing well, no changes were made.  TODAY She continues to do well occ palpitations, no changes in behavior. No CP, breathing is at her baseline She continues to follow with pulmonary No active cough/unusual SOB. No near syncope or syncope. She will sometimes feel a little dizzy when she looks up, this is a sense of movement/off balance, not lightheaded or near syncopal.    Device information BSCi single chamber ICD implanted 12/22/2009 Secondary prevention  Past Medical History:  Diagnosis Date  . AICD (automatic cardioverter/defibrillator) present   . Anxiety   . Arthritis   . Back pain   . Breast cancer ([place]) 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  . Bronchiectasis ([place])   . Cerebral aneurysm, nonruptured    had a clip  put in  . CHF (congestive heart failure) ([place])   . Clostridium difficile infection   . Depressive disorder, not elsewhere classified   . Diverticulosis of colon (without mention of hemorrhage)   . Esophageal reflux   . Fatty liver   . Fibromyalgia   . Gastritis   . GERD (gastroesophageal reflux disease)   . GI bleed 2004  . Glaucoma   . Hiatal hernia   . Hyperlipidemia   . Hypertension   . Hypothyroidism   . Internal hemorrhoids   . Joint pain   . Obstructive sleep apnea (adult) (pediatric)   . Osteoarthritis   . Ostium secundum type atrial septal defect   . Other chronic nonalcoholic liver disease   . Other pulmonary embolism and infarction   . Palpitations   . Paroxysmal ventricular tachycardia ([place])   . Personal history of radiation therapy   . Pneumonia    history  . Presence of permanent cardiac pacemaker   . PUD (peptic ulcer disease)   . Radiation 02/03/15-03/10/15   Right Breast  . Sarcoid    per pt , not sure  . Schatzki's ring   . Shortness of breath   . Sleep apnea   . Stroke [facility]) 2013   tia/ pt feels it was around 2008 0r 2009  . Takotsubo syndrome   . Tubular adenoma of colon   . Unspecified transient cerebral ischemia   . Unspecified vitamin D deficiency     Past Surgical History:  Procedure Laterality Date  . ABI  2006   normal  .  BREAST EXCISIONAL BIOPSY    . BREAST LUMPECTOMY WITH RADIOACTIVE SEED LOCALIZATION Right 01/03/2015   Procedure: BREAST LUMPECTOMY WITH RADIOACTIVE SEED LOCALIZATION;  Surgeon: Autumn Messing III, MD;  Location: [facility];  Service: General;  Laterality: Right;  . CARDIAC DEFIBRILLATOR PLACEMENT  2006; 2012   BSX single chamber ICD  . carotid dopplers  2006   neg  . CEREBRAL ANEURYSM REPAIR  02/1999  . COLONOSCOPY    . HAMMER TOE SURGERY  11/15/2020   3rd digit bilateral feet   . hospitalization  2004   GI bleed, PUD, diverticulosis (EGD,colonscopy)  . hospitalization     PE, NSVT, s/p defib  . KNEE ARTHROSCOPY      bilateral  . LEFT HEART CATHETERIZATION WITH CORONARY ANGIOGRAM N/A 02/22/2012   Procedure: LEFT HEART CATHETERIZATION WITH CORONARY ANGIOGRAM;  Surgeon: Burnell Blanks, MD;  Location[facility] C[facility] LAB;  Service: Cardiovascular;  Laterality: N/A;  . PARTIAL HYSTERECTOMY     Fibroids  . TONGUE BIOPSY  12/12/2017   due to sore tongue and white patches/abnormal cells  . TOTAL KNEE ARTHROPLASTY Left 02/13/2016   Procedure: TOTAL KNEE ARTHROPLASTY;  Surgeon: Vickey Huger, MD;  Location: [facility];  Service: Orthopedics;  Laterality: Left;  . UPPER GASTROINTESTINAL ENDOSCOPY      Current Outpatient Medications  Medication Sig Dispense Refill  . albuterol (PROVENTIL) (2.5 MG/3ML) 0.083% nebulizer solution USE 1 VIAL IN NEBULIZER EVERY 6 HOURS 120 mL 11  . albuterol (VENTOLIN HFA) 108 (90 Base) MCG/ACT inhaler Inhale 2 puffs into the lungs every 6 (six) hours as needed for wheezing or shortness of breath. 18 g 3  . Ascorbic Acid (VITAMIN C) 1000 MG tablet Take 1,000 mg by mouth daily.    [name] aspirin 81 MG tablet Take 81 mg by mouth daily.    . baclofen (LIORESAL) 10 MG tablet TAKE 1 TABLET BY MOUTH AT BEDTIME AS NEEDED FOR MUSCLE SPASM 30 tablet 0  . BLACK CURRANT SEED OIL PO Take 1 tablet by mouth daily.    . calcium carbonate (OS-CAL - DOSED IN MG OF ELEMENTAL CALCIUM) 1250 (500 Ca) MG tablet Take 1 tablet by mouth.    . cholecalciferol (VITAMIN D) 1000 UNITS tablet Take 5,000 Units by mouth daily.     . diclofenac Sodium (VOLTAREN) 1 % GEL Apply 2-4 grams to affected joint 4 times daily as needed. 400 g 2  . ELDERBERRY PO Take by mouth.    . fexofenadine (ALLEGRA) 180 MG tablet Take 180 mg by mouth daily as needed for allergies or rhinitis.    [name] FLUoxetine (PROZAC) 10 MG capsule Take 1 capsule (10 mg total) by mouth daily. 90 capsule 0  . fluticasone (FLONASE) 50 MCG/ACT nasal spray Place 2 sprays into both nostrils daily as needed for allergies or rhinitis. 48 g 3  . furosemide (LASIX) 40 MG tablet  Take 20 mg by mouth daily. 20-40mg     . hydroxypropyl methylcellulose / hypromellose (ISOPTO TEARS / GONIOVISC) 2.5 % ophthalmic solution Place 1 drop into both eyes daily.    [name] latanoprost (XALATAN) 0.005 % ophthalmic solution Place 1 drop into both eyes at bedtime.    [name] levothyroxine (SYNTHROID) 75 MCG tablet Take 1 tablet (75 mcg total) by mouth daily. 30 tablet 1  . magnesium oxide (MAG-OX) 400 MG tablet Take 1 tablet (400 mg total) by mouth in the morning, at noon, and at bedtime. 270 tablet 3  . mometasone (ELOCON) 0.1 % lotion Apply 1 application topically daily  as needed (rash).     . montelukast (SINGULAIR) 10 MG tablet TAKE 1 TABLET AT BEDTIME 90 tablet 3  . Multiple Vitamins-Minerals (MULTIVITAMIN PO) Take 1 tablet by mouth daily.    . pantoprazole (PROTONIX) 40 MG tablet Take 1 tablet (40 mg total) by mouth daily. Please schedule an office visit for further refills. Thank you 30 tablet 1  . polyethylene glycol powder (GLYCOLAX/MIRALAX) powder Take 17 grams by mouth daily 1080 g 0  . potassium chloride (KLOR-CON) 10 MEQ tablet TAKE 1/2 TABLET EVERY DAY 45 tablet 3  . Respiratory Therapy Supplies (FLUTTER) DEVI 1 Device by Does not apply route as needed. 1 each 0  . sodium chloride HYPERTONIC 3 % nebulizer solution Take by nebulization 2 (two) times daily as needed for cough. 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 750 mL 12  . traMADol (ULTRAM) 50 MG tablet Take 1 tablet (50 mg total) by mouth 2 (two) times daily. 180 tablet 1  . zinc gluconate 50 MG tablet Take 50 mg by mouth daily.    [name] zolpidem (AMBIEN) 10 MG tablet Take 1 tablet (10 mg total) by mouth at bedtime as needed for sleep. 30 tablet 0  . carvedilol (COREG) 12.5 MG tablet Take 1 tablet (12.5 mg total) by mouth 2 (two) times daily. 180 tablet 3  . nitroGLYCERIN (NITROSTAT) 0.4 MG SL tablet Place 1 tablet (0.4 mg total) under the tongue every 5 (five) minutes as needed. 25 tablet 3  . simvastatin (ZOCOR) 40 MG tablet TAKE 1/2 TABLET EVERY DAY.  45 tablet 3   No current facility-administered medications for this visit.    Allergies:   Ace inhibitors, Amitriptyline hcl, Atorvastatin, Cymbalta [duloxetine hcl], Dilantin [phenytoin sodium extended], Paroxetine, Ramipril, Rosuvastatin, Carbamazepine, Codeine, Iodine-131, Phenytoin sodium extended, and Wellbutrin [bupropion]   Social History:  The patient  reports that she quit smoking about 31 years ago. Her smoking use included cigarettes. She has a 15.00 pack-year smoking history. She has never used smokeless tobacco. She reports current alcohol use. She reports that she does not use drugs.   Family History:  The patient's family history includes Allergies in her sister; Alzheimer's disease in her mother; Breast cancer in her cousin and maternal aunt; Breast cancer (age of onset: 1) in her maternal aunt; Cancer (age of onset: 39) in her maternal grandmother; Colon cancer (age of onset: 42) in her maternal aunt; Diabetes in her mother; Heart disease in her brother, father, sister, and son; Hypertension in her mother; Lung cancer in her maternal uncle; Lung cancer (age of onset: 65) in her maternal grandfather; Obesity in her mother; Other in her brother; Pancreatic cancer (age of onset: 59) in her cousin; Prostate cancer (age of onset: 65) in her brother; Throat cancer in her brother.  ROS:  Please see the history of present illness.    All other systems are reviewed and otherwise negative.   PHYSICAL EXAM:  VS:  BP 118/60   Pulse 66   Ht 5\' 6"  (1.676 m)   Wt 220 lb 12.8 oz (100.2 kg)   LMP  (LMP Unknown)   SpO2 97%   BMI 35.64 kg/m  BMI: Body mass index is 35.64 kg/m. Well nourished, well developed, in no acute distress HEENT: normocephalic, atraumatic Neck: no JVD, carotid bruits or masses Cardiac:  RRR; no significant murmurs, no rubs, or gallops Lungs:  CTA b/l, no wheezing, rhonchi or rales Abd: soft, nontender, obese MS: no deformity or atrophy Ext: no edema Skin: warm  and dry,  no rash Neuro:  No gross deficits appreciated Psych: euthymic mood, full affect  ICD site is stable, no tethering or discomfort   EKG: not done today  Device interrogation done today and reviewed by myself:  Battery and lead measurements are good NSVT  All December 2021 or before 2 EGMs available for review, one by morphology was a brief SVT 5 beats The other 6 beats, slow for VT  04/13/2020: stress myoview  The left ventricular ejection fraction is mildly decreased (45-54%).  Nuclear stress EF: 50%.  There was no ST segment deviation noted during stress.  The perfusion study is normal.  This is a low risk study.    05/13/2017; TTE Study Conclusions  - Left ventricle: The cavity size was normal. Wall thickness was  increased in a pattern of mild LVH. Systolic function was normal.  The estimated ejection fraction was in the range of 55% to 60%.  Wall motion was normal; there were no regional wall motion  abnormalities. Doppler parameters are consistent with abnormal  left ventricular relaxation (grade 1 diastolic dysfunction).  - Mitral valve: Mildly thickened leaflets . Valve area by pressure  half-time: 2.32 cm^2.  - Left atrium: Volume/bsa, ES (1-plane Simpson&'s, A4C): 33.2  ml/m^2.  - Right ventricle: Pacer wire or catheter noted in right ventricle.  - Right atrium: The atrium was mildly dilated.  - Tricuspid valve: There was mild regurgitation.   Impressions:  - Compared to the prior study, there has been no significant  interval change.     Recent Labs: 02/16/2020: Magnesium 2.3 12/20/2020: BUN 15; Creatinine, Ser 0.86; Hemoglobin 12.6; Platelets 190.0; Potassium 4.2; Sodium 140; TSH 4.53  12/20/2020: Cholesterol 178; HDL 59.30; LDL Cholesterol 100; Total CHOL/HDL Ratio 3; Triglycerides 92.0; VLDL 18.4   Estimated Creatinine Clearance: 70.7 mL/min (by C-G formula based on SCr of 0.86 mg/dL).   Wt Readings from Last 3 Encounters:   12/21/20 220 lb 12.8 oz (100.2 kg)  12/14/20 220 lb (99.8 kg)  11/08/20 212 lb (96.2 kg)     Other studies reviewed: Additional studies/records reviewed today include: summarized above  ASSESSMENT AND PLAN:  1. ICD      Intact function, no programming changes amde  2. VT, PVCs     One NSVT noted      These are rare  3. NICM     Recovered LVEF by last echo and stress testing     No exam findings or symptoms of volume OL     C/w Dr. Irish Lack  4. HTN     Looks good     No changes  Disposition: F/u with remotes as usual and in clinic with EP in a year, sooner if needed.  Current medicines are reviewed at length with the patient today.  The patient did not have any concerns regarding medicines.  Venetia Night, PA-C 12/21/2020 4:33 PM     [address] [place] [address] [phone] (office)  [phone] (fax)

## 2020-12-24 ENCOUNTER — Other Ambulatory Visit: Payer: Self-pay | Admitting: Internal Medicine

## 2020-12-27 ENCOUNTER — Encounter: Payer: Self-pay | Admitting: Physician Assistant

## 2020-12-27 ENCOUNTER — Telehealth (INDEPENDENT_AMBULATORY_CARE_PROVIDER_SITE_OTHER): Payer: Medicare Other | Admitting: Physician Assistant

## 2020-12-27 DIAGNOSIS — G4700 Insomnia, unspecified: Secondary | ICD-10-CM | POA: Diagnosis not present

## 2020-12-27 DIAGNOSIS — E038 Other specified hypothyroidism: Secondary | ICD-10-CM

## 2020-12-27 DIAGNOSIS — J301 Allergic rhinitis due to pollen: Secondary | ICD-10-CM | POA: Diagnosis not present

## 2020-12-27 DIAGNOSIS — R7303 Prediabetes: Secondary | ICD-10-CM | POA: Diagnosis not present

## 2020-12-27 MED ORDER — FLUTICASONE PROPIONATE 50 MCG/ACT NA SUSP: 2.0000 | Freq: Every day | NASAL | 3 refills | Status: DC | PRN

## 2020-12-27 MED ORDER — LEVOTHYROXINE SODIUM 75 MCG PO TABS: 75.0000 ug | ORAL_TABLET | Freq: Every day | ORAL | 0 refills | Status: DC

## 2020-12-27 MED ORDER — ZOLPIDEM TARTRATE 5 MG PO TABS: 5.0000 mg | ORAL_TABLET | Freq: Every evening | ORAL | 0 refills | Status: DC | PRN

## 2020-12-27 NOTE — Progress Notes (Signed)
Virtual Visit via Video   I connected with [name] on 12/27/20 at  2:30 PM EDT by a video enabled telemedicine application and verified that I am speaking with the correct person using two identifiers. Location patient: Home Location provider: [facility], Office Persons participating in the virtual visit: Syesha, Thaw PA-C  I discussed the limitations of evaluation and management by telemedicine and the availability of in person appointments. The patient expressed understanding and agreed to proceed.  Subjective:   HPI:   Insomnia Has been on ambien for 15 years. She is currently on 10 mg dosage. She tolerates this well without any significant issues or side effects. She has been without this for a few days and is having difficulty sleeping. Denies falls or sleepwalking while on this medication.  Hypothyroidism Currently taking 75 mcg levothyroxine daily. Tolerating well. Taking as prescribed. Denies concerns with temperature intolerance, significant weight fluctuations or cardiac symptoms.  Lab Results  Component Value Date   TSH 4.53 (H) 12/20/2020    Allergic Rhinitis Takes allegra daily and uses flonase nightly. Needs flonase refill. These medications work well for her. Denies any significant cough, congestion, itching of eyes.  Insulin resistance She is working on better diet and lifestyle changes. She would like her A1c updated. Last fasting blood work from 1 week ago shows a blood glucose of 94. Denies excessive thirst/urination. Followed by [facility].  Lab Results  Component Value Date   HGBA1C 5.3 12/07/2019     ROS: See pertinent positives and negatives per HPI.  Patient Active Problem List   Diagnosis Date Noted  . Trochanteric bursitis of right hip 10/03/2020  . Tailbone injury 05/05/2020  . UTI (urinary tract infection) 05/05/2020  . Insomnia 05/05/2020  . Leg cramps 02/16/2020  . Dysuria 02/16/2020  . Right  hip impingement syndrome 02/01/2020  . Right bicipital tenosynovitis 02/01/2020  . Bronchiectasis without complication ([place]) 27/78/2423  . Myofascial pain 12/21/2019  . Estrogen deficiency 12/10/2019  . Obesity (BMI 30-39.9) 12/10/2019  . Bronchiectasis with (acute) exacerbation ([place]) 10/23/2019  . Elevated LFTs 10/15/2019  . Lower abdominal pain 09/01/2019  . Neck pain 10/25/2017  . Paresthesias in left hand 10/25/2017  . Paresthesia of foot, bilateral 04/12/2017  . Primary osteoarthritis of both hands 09/27/2016  . Primary osteoarthritis of both knees 09/27/2016  . 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  . Nasopharyngitis, 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/2016  . S/P total knee replacement 02/13/2016  . Chronic pain of both knees 12/06/2015  . Globus pharyngeus 05/17/2015  . Genetic testing 12/23/2014  . Family history of breast cancer   . Family history of colon cancer   . Family history of pancreatic cancer   . History of breast cancer 11/23/2014  . Allergy to ACE inhibitors 09/27/2014  . Prediabetes 06/23/2013  . RLS (restless legs syndrome) 01/15/2013  . Hx of Clostridium difficile infection 10/10/2012  . Dyspnea on exertion 04/02/2012  . NICM (nonischemic cardiomyopathy) ([place]) 03/07/2012  . Post-menopausal 01/11/2012  . Obstructive sleep apnea 04/04/2010  . V-tach ([place]) 01/03/2009  . ICD  [employer]  Single chamber 01/03/2009  . PFO (patent foramen ovale) 09/29/2008  . Vitamin D deficiency 08/17/2008  . Hypothyroidism 11/18/2006  . Hyperlipidemia 11/18/2006  . Depression with anxiety 11/18/2006  . Essential hypertension 11/18/2006  . Mitral valve prolapse 11/18/2006  . Cerebral aneurysm 11/18/2006  . Allergic rhinitis 11/18/2006  . GERD 11/18/2006  . Diverticulosis of colon 11/18/2006  . Fatty liver 11/18/2006  . Fibromyalgia  11/18/2006    Social History   Tobacco Use  . Smoking status: Former Smoker    Packs/day: 1.00    Years: 15.00    Pack years: 15.00    Types:  Cigarettes    Quit date: 08/20/1989    Years since quitting: 31.3  . Smokeless tobacco: Never Used  . Tobacco comment: Started at 34; less than 1 PPD  Substance Use Topics  . Alcohol use: Yes    Alcohol/week: 0.0 standard drinks    Comment: rare    Current Outpatient Medications:  .  albuterol (PROVENTIL) (2.5 MG/3ML) 0.083% nebulizer solution, USE 1 VIAL IN NEBULIZER EVERY 6 HOURS, Disp: 120 mL, Rfl: 11 .  albuterol (VENTOLIN HFA) 108 (90 Base) MCG/ACT inhaler, Inhale 2 puffs into the lungs every 6 (six) hours as needed for wheezing or shortness of breath., Disp: 18 g, Rfl: 3 .  Ascorbic Acid (VITAMIN C) 1000 MG tablet, Take 1,000 mg by mouth daily., Disp: , Rfl:  .  aspirin 81 MG tablet, Take 81 mg by mouth daily., Disp: , Rfl:  .  baclofen (LIORESAL) 10 MG tablet, TAKE 1 TABLET BY MOUTH AT BEDTIME AS NEEDED FOR MUSCLE SPASM, Disp: 30 tablet, Rfl: 0 .  BLACK CURRANT SEED OIL PO, Take 1 tablet by mouth daily., Disp: , Rfl:  .  calcium carbonate (OS-CAL - DOSED IN MG OF ELEMENTAL CALCIUM) 1250 (500 Ca) MG tablet, Take 1 tablet by mouth., Disp: , Rfl:  .  carvedilol (COREG) 12.5 MG tablet, Take 1 tablet (12.5 mg total) by mouth 2 (two) times daily., Disp: 180 tablet, Rfl: 3 .  cholecalciferol (VITAMIN D) 1000 UNITS tablet, Take 5,000 Units by mouth daily. , Disp: , Rfl:  .  diclofenac Sodium (VOLTAREN) 1 % GEL, Apply 2-4 grams to affected joint 4 times daily as needed., Disp: 400 g, Rfl: 2 .  ELDERBERRY PO, Take by mouth., Disp: , Rfl:  .  fexofenadine (ALLEGRA) 180 MG tablet, Take 180 mg by mouth daily as needed for allergies or rhinitis., Disp: , Rfl:  .  fluticasone (FLONASE) 50 MCG/ACT nasal spray, Place 2 sprays into both nostrils daily as needed for allergies or rhinitis., Disp: 48 g, Rfl: 3 .  furosemide (LASIX) 40 MG tablet, Take 20 mg by mouth daily. 20-40mg , Disp: , Rfl:  .  hydroxypropyl methylcellulose / hypromellose (ISOPTO TEARS / GONIOVISC) 2.5 % ophthalmic solution, Place 1  drop into both eyes daily., Disp: , Rfl:  .  latanoprost (XALATAN) 0.005 % ophthalmic solution, Place 1 drop into both eyes at bedtime., Disp: , Rfl:  .  levothyroxine (SYNTHROID) 75 MCG tablet, Take 1 tablet (75 mcg total) by mouth daily., Disp: 30 tablet, Rfl: 1 .  mometasone (ELOCON) 0.1 % lotion, Apply 1 application topically daily as needed (rash). , Disp: , Rfl:  .  montelukast (SINGULAIR) 10 MG tablet, TAKE 1 TABLET AT BEDTIME, Disp: 90 tablet, Rfl: 3 .  Multiple Vitamins-Minerals (MULTIVITAMIN PO), Take 1 tablet by mouth daily., Disp: , Rfl:  .  pantoprazole (PROTONIX) 40 MG tablet, Take 1 tablet (40 mg total) by mouth daily. Please schedule an office visit for further refills. Thank you, Disp: 30 tablet, Rfl: 1 .  polyethylene glycol powder (GLYCOLAX/MIRALAX) powder, Take 17 grams by mouth daily, Disp: 1080 g, Rfl: 0 .  potassium chloride (KLOR-CON) 10 MEQ tablet, TAKE 1/2 TABLET EVERY DAY, Disp: 45 tablet, Rfl: 3 .  Respiratory Therapy Supplies (FLUTTER) DEVI, 1 Device by Does not apply route as  needed., Disp: 1 each, Rfl: 0 .  simvastatin (ZOCOR) 40 MG tablet, TAKE 1/2 TABLET EVERY DAY., Disp: 45 tablet, Rfl: 3 .  sodium chloride HYPERTONIC 3 % nebulizer solution, Take by nebulization 2 (two) times daily as needed for cough. 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, Disp: 750 mL, Rfl: 12 .  traMADol (ULTRAM) 50 MG tablet, Take 1 tablet (50 mg total) by mouth 2 (two) times daily., Disp: 180 tablet, Rfl: 1 .  zinc gluconate 50 MG tablet, Take 50 mg by mouth daily., Disp: , Rfl:  .  FLUoxetine (PROZAC) 10 MG capsule, Take 1 capsule (10 mg total) by mouth daily. (Patient not taking: Reported on 12/27/2020), Disp: 90 capsule, Rfl: 0 .  magnesium oxide (MAG-OX) 400 MG tablet, Take 1 tablet (400 mg total) by mouth in the morning, at noon, and at bedtime., Disp: 270 tablet, Rfl: 3 .  nitroGLYCERIN (NITROSTAT) 0.4 MG SL tablet, Place 1 tablet (0.4 mg total) under the tongue every 5 (five) minutes as needed., Disp: 25  tablet, Rfl: 3  Allergies  Allergen Reactions  . Ace Inhibitors Swelling    Angioedema; makes tongue "break out"   . Amitriptyline Hcl Other (See Comments)     makes her too sleepy!  . Atorvastatin Other (See Comments)    SEVERE MYALGIA  . Cymbalta [Duloxetine Hcl] Nausea Only and Other (See Comments)    Sleepiness/ sick  . Dilantin [Phenytoin Sodium Extended] Rash    Severe rash  . Paroxetine Nausea Only    Rapid heartbeat  . Ramipril Other (See Comments)    TONGUE ULCERS   . Rosuvastatin Other (See Comments)    SEVERE MYALGIA  . Carbamazepine Rash  . Codeine Itching  . Iodine-131 Other (See Comments)    Swelling at IV site only, no swelling or SOB around mouth.!  . Phenytoin Sodium Extended Rash  . Wellbutrin [Bupropion] Palpitations    Objective:   VITALS: Per patient if applicable, see vitals. GENERAL: Alert, appears well and in no acute distress. HEENT: Atraumatic, conjunctiva clear, no obvious abnormalities on inspection of external nose and ears. NECK: Normal movements of the head and neck. CARDIOPULMONARY: No increased WOB. Speaking in clear sentences. I:E ratio WNL.  MS: Moves all visible extremities without noticeable abnormality. PSYCH: Pleasant and cooperative, well-groomed. Speech normal rate and rhythm. Affect is appropriate. Insight and judgement are appropriate. Attention is focused, linear, and appropriate.  NEURO: CN grossly intact. Oriented as arrived to appointment on time with no prompting. Moves both UE equally.  SKIN: No obvious lesions, wounds, erythema, or cyanosis noted on face or hands.  Assessment and Plan:   [place] was seen today for follow-up and insomnia.  Diagnoses and all orders for this visit:  Other specified hypothyroidism Discussed need for thyroid recheck in 1 month. Continue levothyroxine 75 mcg dosage. Follow-up based on thyroid recheck -- order placed.  Insomnia, unspecified type Will trial decreased dosage of ambien 5  mg. She will let us know if this is insufficient for her.  Insulin resistance Fasting blood sugar was WNL on most recent blood draw. Continue to work towards healthy lifestyle. Future A1c has been added for her to have done per her request, as well as LFTs.  Seasonal allergic rhinitis due to pollen Continue flonase and allegra.  I discussed the assessment and treatment plan with the patient. The patient was provided an opportunity to ask questions and all were answered. The patient agreed with the plan and demonstrated an understanding of the instructions.  The patient was advised to call back or seek an in-person evaluation if the symptoms worsen or if the condition fails to improve as anticipated.  Time spent with patient today was 25 minutes which consisted of chart review, discussing diagnosis, work up, treatment answering questions and documentation.    [place], [place] 12/27/2020

## 2020-12-28 ENCOUNTER — Encounter: Payer: Self-pay | Admitting: Podiatry

## 2020-12-28 ENCOUNTER — Ambulatory Visit (INDEPENDENT_AMBULATORY_CARE_PROVIDER_SITE_OTHER): Payer: Medicare Other

## 2020-12-28 ENCOUNTER — Ambulatory Visit (INDEPENDENT_AMBULATORY_CARE_PROVIDER_SITE_OTHER): Payer: Medicare Other | Admitting: Podiatry

## 2020-12-28 ENCOUNTER — Other Ambulatory Visit: Payer: Self-pay

## 2020-12-28 DIAGNOSIS — Z9889 Other specified postprocedural states: Secondary | ICD-10-CM

## 2020-12-28 DIAGNOSIS — M2042 Other hammer toe(s) (acquired), left foot: Secondary | ICD-10-CM

## 2020-12-28 DIAGNOSIS — M2041 Other hammer toe(s) (acquired), right foot: Secondary | ICD-10-CM | POA: Diagnosis not present

## 2020-12-28 DIAGNOSIS — M779 Enthesopathy, unspecified: Secondary | ICD-10-CM | POA: Diagnosis not present

## 2020-12-28 NOTE — Progress Notes (Signed)
Subjective:   Patient ID: [name], female   DOB: 70 y.o.   MRN: [id number]   HPI Patient states she has some irritation of the third digit left and some swelling on both toes that she wanted checked and has lesions bilateral   ROS      Objective:  Physical Exam  Neurovascular status intact negative Bevelyn Buckles' sign noted irritation of the proximal portion of the incision third digit left with patient having a history of hypertrophy of skin anywhere she has pressure with reduction of pain in the distal aspect digit 3 bilateral which was her goal     Assessment:  Overall doing well with patient having some irritation around the third digit left over right with hypertrophy of skin as part of her generalized foot structure and skin structure     Plan:  H&P x-rays reviewed anesthetized the left third digit debrided some tissue did not note any abnormal suture reaction and applied sterile dressing.  I debrided all of the lesions I explained that her skin is this way and we did accomplish our goal of reducing the pressure on distal portions of the toes and we will have to see how this responds to the trimming technique  X-rays indicate satisfactory resection of bone head of middle phalanx digit 3 bilateral

## 2020-12-29 ENCOUNTER — Encounter (INDEPENDENT_AMBULATORY_CARE_PROVIDER_SITE_OTHER): Payer: Self-pay | Admitting: Family Medicine

## 2020-12-29 ENCOUNTER — Other Ambulatory Visit: Payer: Self-pay

## 2020-12-29 ENCOUNTER — Ambulatory Visit (INDEPENDENT_AMBULATORY_CARE_PROVIDER_SITE_OTHER): Payer: Medicare Other | Admitting: Family Medicine

## 2020-12-29 VITALS — BP 114/71 | HR 62 | Temp 97.9°F | Ht 66.0 in | Wt 216.0 lb

## 2020-12-29 DIAGNOSIS — E039 Hypothyroidism, unspecified: Secondary | ICD-10-CM | POA: Diagnosis not present

## 2020-12-29 DIAGNOSIS — Z6836 Body mass index (BMI) 36.0-36.9, adult: Secondary | ICD-10-CM

## 2020-12-29 DIAGNOSIS — E8881 Metabolic syndrome: Secondary | ICD-10-CM

## 2020-12-29 DIAGNOSIS — M797 Fibromyalgia: Secondary | ICD-10-CM | POA: Diagnosis not present

## 2020-12-29 MED ORDER — LEVOTHYROXINE SODIUM 50 MCG PO TABS: 50.0000 ug | ORAL_TABLET | Freq: Every day | ORAL | 3 refills | Status: DC

## 2021-01-02 ENCOUNTER — Encounter: Payer: Self-pay | Admitting: Physical Medicine and Rehabilitation

## 2021-01-02 ENCOUNTER — Encounter
Payer: Medicare Other | Attending: Physical Medicine and Rehabilitation | Admitting: Physical Medicine and Rehabilitation

## 2021-01-02 ENCOUNTER — Other Ambulatory Visit: Payer: Self-pay

## 2021-01-02 VITALS — BP 109/73 | HR 61 | Temp 98.3°F | Ht 66.0 in | Wt 224.0 lb

## 2021-01-02 DIAGNOSIS — M19011 Primary osteoarthritis, right shoulder: Secondary | ICD-10-CM | POA: Insufficient documentation

## 2021-01-02 DIAGNOSIS — M19012 Primary osteoarthritis, left shoulder: Secondary | ICD-10-CM | POA: Diagnosis present

## 2021-01-02 DIAGNOSIS — M797 Fibromyalgia: Secondary | ICD-10-CM | POA: Insufficient documentation

## 2021-01-02 DIAGNOSIS — M170 Bilateral primary osteoarthritis of knee: Secondary | ICD-10-CM | POA: Diagnosis present

## 2021-01-02 DIAGNOSIS — M7918 Myalgia, other site: Secondary | ICD-10-CM | POA: Insufficient documentation

## 2021-01-02 NOTE — Progress Notes (Signed)
  Patient is a 26 yr oldR handedfemale with hx of Bronchiesctasis, CHF, prediabetes- with A1c of 5.3- - not on blood thinners, has a defibrillator, GERD, and fibromyalgia issues- dx'd 10 years ago. [place] neck and B/L shoulder injections and overall f/u.    Shoulders really tight-  Have had trigger point injections in past-  Lasted a pretty good while. But started to bother her- 4-6 weeks ago started getting really bad.   Knee injection lasted only 1 month.   And now working outside in the yard- feeling pain more.   Willing to get knee replacement but doesn't want to get in summer wants to wait til Summer over.    Cholesterol is up 30 points.  Thyroid is not doing as well-  Weight and wellness doctor put her on double pills- so increased from 50 to 100 mcg/day.  Just had physical.    Assessment: Patient is a 26 yr oldR handedfemale with hx of Bronchiesctasis, CHF, prediabetes- with A1c of 5.3- - not on blood thinners, has a defibrillator, GERD, and fibromyalgia issues- dx'd 10 years ago. [place] neck and B/L shoulder injections and overall f/u on chronic pain. .    Plan: 1.  Plans to get COVID booster Wednesday- no problems   2. Went over which injections can do at 1 time- no more than 2 steroid injections at 1 time due to BGs elevations- that can last 4-5 days usually.   3. No knee injections until June 1st. Due to BG's.    4. steroid injection was performed at [facility] joint/shoulder injections using 1% plain Lidocaine and 40mg  /1cc of Kenalog. This was well tolerated.  Cleaned with betadine x3 and allowed to dry- then alcohol then injected using 27 gauge 1.5 inch needle- no bleeding or complications.    F/U in 3 months for steroid injections of B/L shoulders.  Lidocaine will kick in 15 minutes- and wear off tonight- the steroid will kick in tomorrow within 24 hours and take up to 72 hours to fully kick in.  5. Suggest call Ortho and get surgery scheduled per her  schedule.   6. Patient here for trigger point injections for  Consent done and on chart.  Cleaned areas with alcohol and injected using a 27 gauge 1.5 inch needle  Injected 2.5cc Using 1% Lidocaine with no EPI  Upper traps B/L Levators B/L Posterior scalenes B/L Middle scalenes- still tight on L>R Splenius Capitus Pectoralis Major R only- since has defibrillator Rhomboids Infraspinatus Teres Major/minor Thoracic paraspinals Lumbar paraspinals Other injections-    Patient's level of pain prior was- 6/10.  Current level of pain after injections is maybe a little different ROM-wise; pain level the same  There was no bleeding or complications.  Patient was advised to drink a lot of water on day after injections to flush system Will have increased soreness for 12-48 hours after injections.  Can use Lidocaine patches the day AFTER injections Can use theracane on day of injections in places didn't inject Can use heating pad 4-6 hours AFTER injections  7. F/U in 3 months

## 2021-01-02 NOTE — Patient Instructions (Signed)
Assessment: Patient is a 19 yr oldR handedfemale with hx of Bronchiesctasis, CHF, prediabetes- with A1c of 5.3- - not on blood thinners, has a defibrillator, GERD, and fibromyalgia issues- dx'd 10 years ago. [place] neck and B/L shoulder injections and overall f/u.    Plan: 1.  Plans to get COVID booster Wednesday- no problems   2. Went over which injections can do at 1 time- no more than 2 steroid injections at 1 time due to BGs elevations- that can last 4-5 days usually.   3. No knee injections until June 1st. Due to BG's.    4. steroid injection was performed at [facility] joint/shoulder injections using 1% plain Lidocaine and 40mg  /1cc of Kenalog. This was well tolerated.  Cleaned with betadine x3 and allowed to dry- then alcohol then injected using 27 gauge 1.5 inch needle- no bleeding or complications.    F/U in 3 months for steroid injections of B/L shoulders.  Lidocaine will kick in 15 minutes- and wear off tonight- the steroid will kick in tomorrow within 24 hours and take up to 72 hours to fully kick in.  5. Suggest call Ortho and get surgery scheduled per her schedule.   6. Patient here for trigger point injections for  Consent done and on chart.  Cleaned areas with alcohol and injected using a 27 gauge 1.5 inch needle  Injected 2.5cc Using 1% Lidocaine with no EPI  Upper traps B/L Levators B/L Posterior scalenes B/L Middle scalenes- still tight on L>R Splenius Capitus Pectoralis Major R only- since has defibrillator Rhomboids Infraspinatus Teres Major/minor Thoracic paraspinals Lumbar paraspinals Other injections-    Patient's level of pain prior was- 6/10.  Current level of pain after injections is maybe a little different ROM-wise; pain level the same  There was no bleeding or complications.  Patient was advised to drink a lot of water on day after injections to flush system Will have increased soreness for 12-48 hours after injections.  Can use  Lidocaine patches the day AFTER injections Can use theracane on day of injections in places didn't inject Can use heating pad 4-6 hours AFTER injections  7. F/U in 3 months

## 2021-01-04 ENCOUNTER — Other Ambulatory Visit: Payer: Self-pay | Admitting: Physician Assistant

## 2021-01-04 ENCOUNTER — Other Ambulatory Visit: Payer: Self-pay | Admitting: Internal Medicine

## 2021-01-04 NOTE — Progress Notes (Signed)
Chief Complaint:   OBESITY [name] is here to discuss her progress with her obesity treatment plan along with follow-up of her obesity related diagnoses.   Today's visit was #: 86 Starting weight: 223 lbs Starting date: 10/01/2019 Today's weight: 216 lbs Today's date: 12/29/2020 Weight change since last visit: +4 lbs Total lbs lost to date: 7 lbs Body mass index is 34.86 kg/m.  Total weight loss percentage to date: -3.14%  Interim History:  [name] was less active in April due to bilateral foot procedures.  Her TSH was slightly elevated with her PCP.  She admits that she has not been taking levothyroxine alone.  She stopped Prozac because she felt that it was making her lazy.  She says that her Cardiology visit was reassuring.  Current Meal Plan: practicing portion control and making smarter food choices, such as increasing vegetables and decreasing simple carbohydrates for 0% of the time.  Current Exercise Plan: Increased activity..  Assessment/Plan:    Meds ordered this encounter  Medications  . levothyroxine (SYNTHROID) 50 MCG tablet    Sig: Take 1 tablet (50 mcg total) by mouth daily.    Dispense:  90 tablet    Refill:  3    1. Metabolic syndrome Starting goal: Lose 7-10% of starting weight. She will continue to focus on protein-rich, low simple carbohydrate foods. We reviewed the importance of hydration, regular exercise for stress reduction, and restorative sleep.  We will continue to check lab work every 3 months, with 10% weight loss, or should any other concerns arise.  2. Acquired hypothyroidism Course: Elevated. Medication: levothyroxine 50 mcg daily.   Plan: Patient was instructed not to take MVM or iron within 4 hours of taking thyroid medications.  We will continue to monitor alongside Endocrinology/PCP as it relates to her weight loss journey.   Lab Results  Component Value Date   TSH 4.53 (H) 12/20/2020   - Refill levothyroxine (SYNTHROID) 50 MCG tablet;  Take 1 tablet (50 mcg total) by mouth daily.  Dispense: 90 tablet; Refill: 3  3. Fibromyalgia Intensive lifestyle modifications are the first line treatment for this issue. We discussed several lifestyle modifications today and she will continue to work on diet, exercise and weight loss efforts.We will continue to monitor. Orders and follow up as documented in patient record.   Counseling . Try [email or web address]/, which is a series of self-care modules designed to teach patients several techniques to manage pain.   4. Obesity, current BMI 34.9  Course: [name] is currently in the action stage of change. As such, her goal is to continue with weight loss efforts.   Nutrition goals: She has agreed to practicing portion control and making smarter food choices, such as increasing vegetables and decreasing simple carbohydrates.   Exercise goals: Older adults should follow the adult guidelines. When older adults cannot meet the adult guidelines, they should be as physically active as their abilities and conditions will allow.  Older adults should do exercises that maintain or improve balance if they are at risk of falling.   Behavioral modification strategies: increasing lean protein intake, decreasing simple carbohydrates, increasing vegetables, increasing water intake, decreasing liquid calories and emotional eating strategies.  [name] has agreed to follow-up with our clinic in 4 weeks. She was informed of the importance of frequent follow-up visits to maximize her success with intensive lifestyle modifications for her multiple health conditions.   Objective:   Blood pressure 114/71, pulse 62, temperature 97.9 F (36.6 C), temperature source  Oral, height 5\' 6"  (1.676 m), weight 216 lb (98 kg), SpO2 98 %. Body mass index is 34.86 kg/m.  General: Cooperative, alert, well developed, in no acute distress. HEENT: Conjunctivae and lids unremarkable. Cardiovascular: Regular rhythm.   Lungs: Normal work of breathing. Neurologic: No focal deficits.   Lab Results  Component Value Date   CREATININE 0.86 12/20/2020   BUN 15 12/20/2020   NA 140 12/20/2020   K 4.2 12/20/2020   CL 105 12/20/2020   CO2 27 12/20/2020   Lab Results  Component Value Date   ALT 41 (H) 12/07/2019   AST 37 12/07/2019   ALKPHOS 102 12/07/2019   BILITOT 0.5 12/07/2019   Lab Results  Component Value Date   HGBA1C 5.3 12/07/2019   HGBA1C 5.7 (H) 10/01/2019   HGBA1C 5.4 12/04/2018   HGBA1C 5.6 11/27/2017   HGBA1C 5.6 04/12/2017   Lab Results  Component Value Date   INSULIN 15.6 10/01/2019   Lab Results  Component Value Date   TSH 4.53 (H) 12/20/2020   Lab Results  Component Value Date   CHOL 178 12/20/2020   HDL 59.30 12/20/2020   LDLCALC 100 (H) 12/20/2020   LDLDIRECT 141.8 07/04/2007   TRIG 92.0 12/20/2020   CHOLHDL 3 12/20/2020   Lab Results  Component Value Date   WBC 3.5 (L) 12/20/2020   HGB 12.6 12/20/2020   HCT 38.6 12/20/2020   MCV 92.8 12/20/2020   PLT 190.0 12/20/2020   Lab Results  Component Value Date   IRON 89 10/01/2019   TIBC 335 10/01/2019   FERRITIN 178 (H) 10/01/2019   Obesity Behavioral Intervention:   Approximately 15 minutes were spent on the discussion below.  ASK: We discussed the diagnosis of obesity with [name] today and [name] agreed to give [place] permission to discuss obesity behavioral modification therapy today.  ASSESS: [name] has the diagnosis of obesity and her BMI today is 34.9. [name] is in the action stage of change.   ADVISE: [name] was educated on the multiple health risks of obesity as well as the benefit of weight loss to improve her health. She was advised of the need for long term treatment and the importance of lifestyle modifications to improve her current health and to decrease her risk of future health problems.  AGREE: Multiple dietary modification options and treatment options were discussed and [name] agreed to follow  the recommendations documented in the above note.  ARRANGE: [name] was educated on the importance of frequent visits to treat obesity as outlined per CMS and USPSTF guidelines and agreed to schedule her next follow up appointment today.  Attestation Statements:   Reviewed by clinician on day of visit: allergies, medications, problem list, medical history, surgical history, family history, social history, and previous encounter notes.  I, Water quality scientist, CMA, am acting as transcriptionist for Briscoe Deutscher, DO  I have reviewed the above documentation for accuracy and completeness, and I agree with the above. Briscoe Deutscher, DO

## 2021-01-05 NOTE — Telephone Encounter (Signed)
Next Visit: 01/18/2021  Last Visit: 12/14/2020  Last Fill: 11/15/2020  Dx: Trapezius muscle spasm  Current Dose per office note on 12/14/2020, baclofen 10 mg 1 tablet by mouth at bedtime as needed for muscle spasms.  Okay to refill Baclofen?

## 2021-01-10 ENCOUNTER — Other Ambulatory Visit: Payer: Self-pay

## 2021-01-10 ENCOUNTER — Ambulatory Visit (INDEPENDENT_AMBULATORY_CARE_PROVIDER_SITE_OTHER): Payer: Medicare Other | Admitting: Pulmonary Disease

## 2021-01-10 ENCOUNTER — Encounter: Payer: Self-pay | Admitting: Pulmonary Disease

## 2021-01-10 VITALS — BP 130/60 | HR 66 | Temp 97.4°F | Ht 66.5 in | Wt 226.2 lb

## 2021-01-10 DIAGNOSIS — J301 Allergic rhinitis due to pollen: Secondary | ICD-10-CM | POA: Diagnosis not present

## 2021-01-10 DIAGNOSIS — Z01811 Encounter for preprocedural respiratory examination: Secondary | ICD-10-CM

## 2021-01-10 DIAGNOSIS — J479 Bronchiectasis, uncomplicated: Secondary | ICD-10-CM | POA: Diagnosis not present

## 2021-01-10 DIAGNOSIS — G4733 Obstructive sleep apnea (adult) (pediatric): Secondary | ICD-10-CM | POA: Diagnosis not present

## 2021-01-10 DIAGNOSIS — R058 Other specified cough: Secondary | ICD-10-CM

## 2021-01-10 DIAGNOSIS — Z9989 Dependence on other enabling machines and devices: Secondary | ICD-10-CM | POA: Diagnosis not present

## 2021-01-10 NOTE — Patient Instructions (Signed)
Will call you with results of CPAP download  Follow up in 6 months

## 2021-01-10 NOTE — Progress Notes (Signed)
[facility]  Chief Complaint  Patient presents with  . Follow-up    Wears CPAP-dries throat out    Constitutional:  BP 130/60 (BP Location: Right Arm, Cuff Size: Normal)   Pulse 66   Temp (!) 97.4 F (36.3 C) (Temporal)   Ht 5' 6.5" (1.689 m)   Wt 226 lb 3.2 oz (102.6 kg)   LMP  (LMP Unknown)   SpO2 99%   BMI 35.96 kg/m   Past Medical History:  Vit D deficiency, Takotsubo CM, Schatzki's ring, PUD, VT s/p AICD, PE, NASH, ASD, Hypothyroidism, HTN, HLD, HH, GERD, Fibromyalgia, Diverticulosis, Depression, C diff, Cerebral aneurysm, Breast cancer 2016, OA  Past Surgical History:  Her  has a past surgical history that includes Partial hysterectomy; Cerebral aneurysm repair (02/1999); Knee arthroscopy; Cardiac defibrillator placement (2006; 2012); carotid dopplers (2006); hospitalization (2004); hospitalization; ABI (2006); left heart catheterization with coronary angiogram (N/A, 02/22/2012); Breast lumpectomy with radioactive seed localization (Right, 01/03/2015); Total knee arthroplasty (Left, 02/13/2016); Breast excisional biopsy; Tongue Biopsy (12/12/2017); Colonoscopy; Upper gastrointestinal endoscopy; and Hammer toe surgery (11/15/2020).  Brief Summary:  [name] is a 70 y.o. female former smoker with bronchiectasis and obstructive sleep apnea.      Subjective:   She has been using CPAP nightly.  She has been getting water build up in her tubing.  Her throat has been getting dry.  She thinks her pressure was dropped to 6 cm H2O.  Occasionally gets cough and chest congestion.  Usually brings up small amount of clear to yellow sputum in the morning.  Not having fever, chest pain, or hemoptysis.  Her flutter valve wasn't working, but when demonstrated in office today functioned okay.  She has noticed more trouble with her knee, and this is starting to interfere with her function.  She might have knee surgery with Dr. Ronnie Derby  sometime in the Summer.  Physical Exam:   Appearance - well kempt   ENMT - no sinus tenderness, no oral exudate, no LAN, Mallampati 3 airway, no stridor  Respiratory - equal breath sounds bilaterally, no wheezing or rales  CV - s1s2 regular rate and rhythm, no murmurs  Ext - no clubbing, no edema  Skin - no rashes  Psych - normal mood and affect   Pulmonary testing:   PFT 04/17/12 >> FEV1 2.60 (110%), FEV1% 85, TLC 4.05 (77%), DLCO 44%  PFT 01/17/17 >> FEV1 2.38 (111%), FEV1% 90, TLC 3.85 (71%), DLCO 58%, no BD  RAST 05/30/17 >> dust mites  PFT 10/16/18 >> FEV1 2.27 (111%), FEV1% 90, TLC 4.00 (74%), DLCO 59%  Serology:   04/02/12 >> ACE 1  10/25/15 >> ESR 40, ACE 32  05/30/17 >> IgA, IgG, IgM normal  10/16/18 >> HP panel negative, ACE 25  Chest Imaging:   CT chest 04/10/12 >> b/l lower lobe cylindrical BTX and some in upper lobes  CT angio chest 10/26/15 >> patchy GGO in periphery of lungs b/l, basilar BTX, no PE; no significant change compared to 2013  HRCT chest 10/08/18 >> atherosclerosis, basilar predominant cylindrical BTX, mild centrilobular/paraseptal emphysema, air trapping  CT angio chest 03/24/19 >> Chronic lung changes with peribronchial thickening, bibasilar atelectasis and chronic interstitial disease/peripheral fibrosis at the lung bases  Sleep Tests:   PSG 11/29/08 >> AHI 9  HST 11/10/15 >> AHI 7.1, SaO2 low 75%  CPAP 10/10/19 to 11/08/19 >> used on 30 of 30 nights with average 5 hrs 36 min.  Average AHI 2.9  with CPAP 9 cm H2O.  Cardiac Tests:   Echo9/24/18 >> mild LVH, EF 55 to 60%, grade 1 DD, mild TR  Social History:  She  reports that she quit smoking about 31 years ago. Her smoking use included cigarettes. She has a 15.00 pack-year smoking history. She has never used smokeless tobacco. She reports current alcohol use. She reports that she does not use drugs.  Family History:  Her family history includes Allergies in her sister; Alzheimer's  disease in her mother; Breast cancer in her cousin and maternal aunt; Breast cancer (age of onset: 56) in her maternal aunt; Cancer (age of onset: 50) in her maternal grandmother; Colon cancer (age of onset: 64) in her maternal aunt; Diabetes in her mother; Heart disease in her brother, father, sister, and son; Hypertension in her mother; Lung cancer in her maternal uncle; Lung cancer (age of onset: 31) in her maternal grandfather; Obesity in her mother; Other in her brother; Pancreatic cancer (age of onset: 27) in her cousin; Prostate cancer (age of onset: 71) in her brother; Throat cancer in her brother.     Assessment/Plan:   Bronchiectasis. - prn mucinex - demonstrated flutter valve technique - continue singulair at night - prn chest vest and albutero - hypertonic saline causes throat irritation >> advised her to reserve this for when she has an exacerbation - discussed symptoms to monitor for that would indicate an exacerbation and need for antibiotics  Upper airway cough syndrome with allergic rhinitis. - continue singulair, allegra, flonase, Navage nasal irrigation  Obstructive sleep apnea. - she is compliant with CPAP and reports benefit - she uses [employer] for her DME - should be on CPAP 6 cm H2O >> will get download from her machine and call her with results - discussed techniques to control rain out  Non-ischemic cardiomyopathy. - followed by Dr. Irish Lack and Dr. Caryl Comes with [place]  Knee pain. - she is being followed by Dr. Ronnie Derby with orthopedics - there are no pulmonary contraindications for her to proceed with surgery over the Summer if needed  Time Spent Involved in Patient Care on Day of Examination:  32 minutes  Follow up:  Patient Instructions  Will call you with results of CPAP download  Follow up in 6 months   Medication List:   Allergies as of 01/10/2021      Reactions   Ace Inhibitors Swelling   Angioedema; makes tongue "break out"    Amitriptyline Hcl Other (See Comments)    makes her too sleepy!   Atorvastatin Other (See Comments)   SEVERE MYALGIA   Cymbalta [duloxetine Hcl] Nausea Only, Other (See Comments)   Sleepiness/ sick   Dilantin [phenytoin Sodium Extended] Rash   Severe rash   Paroxetine Nausea Only   Rapid heartbeat   Ramipril Other (See Comments)   TONGUE ULCERS   Rosuvastatin Other (See Comments)   SEVERE MYALGIA   Carbamazepine Rash   Codeine Itching   Iodine-131 Other (See Comments)   Swelling at IV site only, no swelling or SOB around mouth.!   Phenytoin Sodium Extended Rash   Wellbutrin [bupropion] Palpitations      Medication List       Accurate as of Jan 10, 2021  1:23 PM. If you have any questions, ask your nurse or doctor.        albuterol 108 (90 Base) MCG/ACT inhaler Commonly known as: Ventolin HFA Inhale 2 puffs into the lungs every 6 (six) hours as needed for wheezing  or shortness of breath.   albuterol (2.5 MG/3ML) 0.083% nebulizer solution Commonly known as: PROVENTIL USE 1 VIAL IN NEBULIZER EVERY 6 HOURS   aspirin 81 MG tablet Take 81 mg by mouth daily.   baclofen 10 MG tablet Commonly known as: LIORESAL TAKE 1 TABLET BY MOUTH AT BEDTIME AS NEEDED FOR MUSCLE SPASM   BLACK CURRANT SEED OIL PO Take 1 tablet by mouth daily.   calcium carbonate 1250 (500 Ca) MG tablet Commonly known as: OS-CAL - dosed in mg of elemental calcium Take 1 tablet by mouth.   carvedilol 12.5 MG tablet Commonly known as: COREG Take 1 tablet (12.5 mg total) by mouth 2 (two) times daily.   cholecalciferol 1000 units tablet Commonly known as: VITAMIN D Take 5,000 Units by mouth daily.   diclofenac Sodium 1 % Gel Commonly known as: VOLTAREN Apply 2-4 grams to affected joint 4 times daily as needed.   ELDERBERRY PO Take by mouth.   fexofenadine 180 MG tablet Commonly known as: ALLEGRA Take 180 mg by mouth daily as needed for allergies or rhinitis.   FLUoxetine 10 MG  capsule Commonly known as: PROzac Take 1 capsule (10 mg total) by mouth daily.   fluticasone 50 MCG/ACT nasal spray Commonly known as: FLONASE Place 2 sprays into both nostrils daily as needed for allergies or rhinitis.   Flutter Devi 1 Device by Does not apply route as needed.   furosemide 40 MG tablet Commonly known as: LASIX Take 20 mg by mouth daily. 20-2m   hydroxypropyl methylcellulose / hypromellose 2.5 % ophthalmic solution Commonly known as: ISOPTO TEARS / GONIOVISC Place 1 drop into both eyes daily.   latanoprost 0.005 % ophthalmic solution Commonly known as: XALATAN Place 1 drop into both eyes at bedtime.   levothyroxine 50 MCG tablet Commonly known as: SYNTHROID Take 1 tablet (50 mcg total) by mouth daily.   magnesium oxide 400 MG tablet Commonly known as: MAG-OX Take 1 tablet (400 mg total) by mouth in the morning, at noon, and at bedtime.   mometasone 0.1 % lotion Commonly known as: ELOCON Apply 1 application topically daily as needed (rash).   montelukast 10 MG tablet Commonly known as: SINGULAIR TAKE 1 TABLET AT BEDTIME   MULTIVITAMIN PO Take 1 tablet by mouth daily.   nitroGLYCERIN 0.4 MG SL tablet Commonly known as: NITROSTAT Place 1 tablet (0.4 mg total) under the tongue every 5 (five) minutes as needed.   pantoprazole 40 MG tablet Commonly known as: PROTONIX Take 1 tablet (40 mg total) by mouth daily. Please schedule an office visit for further refills. Thank you   polyethylene glycol powder 17 GM/SCOOP powder Commonly known as: GLYCOLAX/MIRALAX Take 17 grams by mouth daily   potassium chloride 10 MEQ tablet Commonly known as: KLOR-CON TAKE 1/2 TABLET EVERY DAY   simvastatin 40 MG tablet Commonly known as: ZOCOR TAKE 1/2 TABLET EVERY DAY.   sodium chloride HYPERTONIC 3 % nebulizer solution Take by nebulization 2 (two) times daily as needed for cough. 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   traMADol 50 MG tablet Commonly known as: ULTRAM Take 1  tablet (50 mg total) by mouth 2 (two) times daily.   vitamin C 1000 MG tablet Take 1,000 mg by mouth daily.   zinc gluconate 50 MG tablet Take 50 mg by mouth daily.   zolpidem 5 MG tablet Commonly known as: AMBIEN Take 1 tablet (5 mg total) by mouth at bedtime as needed for sleep.       Signature:  Chesley Mires, MD [place] Pager - [phone] 01/10/2021, 1:23 PM

## 2021-01-12 ENCOUNTER — Encounter (INDEPENDENT_AMBULATORY_CARE_PROVIDER_SITE_OTHER): Payer: Self-pay | Admitting: Family Medicine

## 2021-01-18 ENCOUNTER — Ambulatory Visit: Payer: Medicare Other | Admitting: Physician Assistant

## 2021-01-20 ENCOUNTER — Telehealth: Payer: Self-pay | Admitting: Pulmonary Disease

## 2021-01-20 NOTE — Telephone Encounter (Signed)
CPAP 12/11/20 to 01/09/21 >> used on 25 of 30 nights with average 4 hrs 49 min.  Average AHI 2.2 with CPAP 6 cm H2O.   Please let her know that CPAP report shows good control with CPAP 6 cm water pressure.

## 2021-01-23 NOTE — Telephone Encounter (Signed)
Called and left message on voicemail to please return phone call to go over Cpap report. Contact number provided.

## 2021-01-24 ENCOUNTER — Telehealth (INDEPENDENT_AMBULATORY_CARE_PROVIDER_SITE_OTHER): Payer: Medicare Other | Admitting: Family Medicine

## 2021-01-24 DIAGNOSIS — J029 Acute pharyngitis, unspecified: Secondary | ICD-10-CM | POA: Diagnosis not present

## 2021-01-24 DIAGNOSIS — J040 Acute laryngitis: Secondary | ICD-10-CM

## 2021-01-24 NOTE — Patient Instructions (Signed)
  HOME CARE TIPS:  -[place] testing information: [email or web address].[facility].[email or web address]/ OR [phone] Most pharmacies also offer testing and home test kits.   -can use nasal saline a few times per day if you have nasal congestion  -warm tea and salt water gargles can help the throat  -stay hydrated, drink plenty of fluids and eat small healthy meals - avoid dairy  -If the Covid test is positive, check out the [facility] website for more information on home care, transmission and treatment for COVID19  -follow up with your doctor in 2-3 days unless improving and feeling better  -stay home while sick, except to seek medical care. If you have COVID19, ideally it would be best to stay home for a full 10 days since the onset of symptoms PLUS one day of no fever and feeling better. Wear a good mask that fits snugly (such as N95 or KN95) if around others to reduce the risk of transmission.  It was nice to meet you today, and I really hope you are feeling better soon. I help [facility] out with telemedicine visits on Tuesdays and Thursdays and am available for visits on those days. If you have any concerns or questions following this visit please schedule a follow up visit with your Primary Care doctor or seek care at a local urgent care clinic to avoid delays in care.    Seek in person care or schedule a follow up video visit promptly if your symptoms worsen, new concerns arise or you are not improving with treatment. Call 911 and/or seek emergency care if your symptoms are severe or life threatening.

## 2021-01-24 NOTE — Progress Notes (Signed)
Virtual Visit via Video Note  I connected with [name]  on 01/24/21 at  4:40 PM EDT by a video enabled telemedicine application and verified that I am speaking with the correct person using two identifiers.  Location patient: home, [place] Location provider:work or home office Persons participating in the virtual visit: patient, provider  I discussed the limitations of evaluation and management by telemedicine and the availability of in person appointments. The patient expressed understanding and agreed to proceed.   HPI:  Acute telemedicine visit for sore throat/laryngitis: -Onset: 1 week ago -Symptoms include: sore throat, laryngitis, sneezing, ears feel a little full, nasal congestion, pnd, known sick contacts -Denies: fevers, CP, SOB, NVD, inability to eat/drink/get out of bed -Pertinent past medical history: extensive - see below -Pertinent medication allergies:  Allergies  Allergen Reactions  . Ace Inhibitors Swelling    Angioedema; makes tongue "break out"   . Amitriptyline Hcl Other (See Comments)     makes her too sleepy!  . Atorvastatin Other (See Comments)    SEVERE MYALGIA  . Cymbalta [Duloxetine Hcl] Nausea Only and Other (See Comments)    Sleepiness/ sick  . Dilantin [Phenytoin Sodium Extended] Rash    Severe rash  . Paroxetine Nausea Only    Rapid heartbeat  . Ramipril Other (See Comments)    TONGUE ULCERS   . Rosuvastatin Other (See Comments)    SEVERE MYALGIA  . Carbamazepine Rash  . Codeine Itching  . Iodine-131 Other (See Comments)    Swelling at IV site only, no swelling or SOB around mouth.!  . Phenytoin Sodium Extended Rash  . Wellbutrin [Bupropion] Palpitations   -COVID-19 vaccine status: vaccinated and had both boosters, had flu shot as well  ROS: See pertinent positives and negatives per HPI.  Past Medical History:  Diagnosis Date  . AICD (automatic cardioverter/defibrillator) present   . Anxiety   . Arthritis   . Back pain   . Breast cancer  ([place]) 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  . Bronchiectasis ([place])   . Cerebral aneurysm, nonruptured    had a clip put in  . CHF (congestive heart failure) ([place])   . Clostridium difficile infection   . Depressive disorder, not elsewhere classified   . Diverticulosis of colon (without mention of hemorrhage)   . Esophageal reflux   . Fatty liver   . Fibromyalgia   . Gastritis   . GERD (gastroesophageal reflux disease)   . GI bleed 2004  . Glaucoma   . Hiatal hernia   . Hyperlipidemia   . Hypertension   . Hypothyroidism   . Internal hemorrhoids   . Joint pain   . Obstructive sleep apnea (adult) (pediatric)   . Osteoarthritis   . Ostium secundum type atrial septal defect   . Other chronic nonalcoholic liver disease   . Other pulmonary embolism and infarction   . Palpitations   . Paroxysmal ventricular tachycardia ([place])   . Personal history of radiation therapy   . Pneumonia    history  . Presence of permanent cardiac pacemaker   . PUD (peptic ulcer disease)   . Radiation 02/03/15-03/10/15   Right Breast  . Sarcoid    per pt , not sure  . Schatzki's ring   . Shortness of breath   . Sleep apnea   . Stroke [facility]) 2013   tia/ pt feels it was around 2008 0r 2009  . Takotsubo syndrome   . Tubular adenoma of colon   . Unspecified  transient cerebral ischemia   . Unspecified vitamin D deficiency     Past Surgical History:  Procedure Laterality Date  . ABI  2006   normal  . BREAST EXCISIONAL BIOPSY    . BREAST LUMPECTOMY WITH RADIOACTIVE SEED LOCALIZATION Right 01/03/2015   Procedure: BREAST LUMPECTOMY WITH RADIOACTIVE SEED LOCALIZATION;  Surgeon: Autumn Messing III, MD;  Location: [facility];  Service: General;  Laterality: Right;  . CARDIAC DEFIBRILLATOR PLACEMENT  2006; 2012   BSX single chamber ICD  . carotid dopplers  2006   neg  . CEREBRAL ANEURYSM REPAIR  02/1999  . COLONOSCOPY    . HAMMER TOE SURGERY  11/15/2020   3rd digit bilateral feet   . hospitalization  2004    GI bleed, PUD, diverticulosis (EGD,colonscopy)  . hospitalization     PE, NSVT, s/p defib  . KNEE ARTHROSCOPY     bilateral  . LEFT HEART CATHETERIZATION WITH CORONARY ANGIOGRAM N/A 02/22/2012   Procedure: LEFT HEART CATHETERIZATION WITH CORONARY ANGIOGRAM;  Surgeon: Burnell Blanks, MD;  Location: [facility] CATH LAB;  Service: Cardiovascular;  Laterality: N/A;  . PARTIAL HYSTERECTOMY     Fibroids  . TONGUE BIOPSY  12/12/2017   due to sore tongue and white patches/abnormal cells  . TOTAL KNEE ARTHROPLASTY Left 02/13/2016   Procedure: TOTAL KNEE ARTHROPLASTY;  Surgeon: Vickey Huger, MD;  Location: [facility];  Service: Orthopedics;  Laterality: Left;  . UPPER GASTROINTESTINAL ENDOSCOPY       Current Outpatient Medications:  .  albuterol (PROVENTIL) (2.5 MG/3ML) 0.083% nebulizer solution, USE 1 VIAL IN NEBULIZER EVERY 6 HOURS, Disp: 120 mL, Rfl: 11 .  albuterol (VENTOLIN HFA) 108 (90 Base) MCG/ACT inhaler, Inhale 2 puffs into the lungs every 6 (six) hours as needed for wheezing or shortness of breath., Disp: 18 g, Rfl: 3 .  Ascorbic Acid (VITAMIN C) 1000 MG tablet, Take 1,000 mg by mouth daily., Disp: , Rfl:  .  aspirin 81 MG tablet, Take 81 mg by mouth daily., Disp: , Rfl:  .  baclofen (LIORESAL) 10 MG tablet, TAKE 1 TABLET BY MOUTH AT BEDTIME AS NEEDED FOR MUSCLE SPASM, Disp: 30 tablet, Rfl: 2 .  BLACK CURRANT SEED OIL PO, Take 1 tablet by mouth daily., Disp: , Rfl:  .  calcium carbonate (OS-CAL - DOSED IN MG OF ELEMENTAL CALCIUM) 1250 (500 Ca) MG tablet, Take 1 tablet by mouth., Disp: , Rfl:  .  carvedilol (COREG) 12.5 MG tablet, Take 1 tablet (12.5 mg total) by mouth 2 (two) times daily., Disp: 180 tablet, Rfl: 3 .  cholecalciferol (VITAMIN D) 1000 UNITS tablet, Take 5,000 Units by mouth daily. , Disp: , Rfl:  .  diclofenac Sodium (VOLTAREN) 1 % GEL, Apply 2-4 grams to affected joint 4 times daily as needed., Disp: 400 g, Rfl: 2 .  ELDERBERRY PO, Take by mouth., Disp: , Rfl:  .  fexofenadine  (ALLEGRA) 180 MG tablet, Take 180 mg by mouth daily as needed for allergies or rhinitis., Disp: , Rfl:  .  FLUoxetine (PROZAC) 10 MG capsule, Take 1 capsule (10 mg total) by mouth daily., Disp: 90 capsule, Rfl: 0 .  fluticasone (FLONASE) 50 MCG/ACT nasal spray, Place 2 sprays into both nostrils daily as needed for allergies or rhinitis., Disp: 48 g, Rfl: 3 .  furosemide (LASIX) 40 MG tablet, Take 20 mg by mouth daily. 20-40mg , Disp: , Rfl:  .  hydroxypropyl methylcellulose / hypromellose (ISOPTO TEARS / GONIOVISC) 2.5 % ophthalmic solution, Place 1 drop into both  eyes daily., Disp: , Rfl:  .  latanoprost (XALATAN) 0.005 % ophthalmic solution, Place 1 drop into both eyes at bedtime., Disp: , Rfl:  .  levothyroxine (SYNTHROID) 50 MCG tablet, Take 1 tablet (50 mcg total) by mouth daily., Disp: 90 tablet, Rfl: 3 .  magnesium oxide (MAG-OX) 400 MG tablet, Take 1 tablet (400 mg total) by mouth in the morning, at noon, and at bedtime., Disp: 270 tablet, Rfl: 3 .  mometasone (ELOCON) 0.1 % lotion, Apply 1 application topically daily as needed (rash). , Disp: , Rfl:  .  montelukast (SINGULAIR) 10 MG tablet, TAKE 1 TABLET AT BEDTIME, Disp: 90 tablet, Rfl: 3 .  Multiple Vitamins-Minerals (MULTIVITAMIN PO), Take 1 tablet by mouth daily., Disp: , Rfl:  .  nitroGLYCERIN (NITROSTAT) 0.4 MG SL tablet, Place 1 tablet (0.4 mg total) under the tongue every 5 (five) minutes as needed., Disp: 25 tablet, Rfl: 3 .  pantoprazole (PROTONIX) 40 MG tablet, Take 1 tablet (40 mg total) by mouth daily. Please schedule an office visit for further refills. Thank you, Disp: 30 tablet, Rfl: 1 .  polyethylene glycol powder (GLYCOLAX/MIRALAX) powder, Take 17 grams by mouth daily, Disp: 1080 g, Rfl: 0 .  potassium chloride (KLOR-CON) 10 MEQ tablet, TAKE 1/2 TABLET EVERY DAY, Disp: 45 tablet, Rfl: 3 .  Respiratory Therapy Supplies (FLUTTER) DEVI, 1 Device by Does not apply route as needed., Disp: 1 each, Rfl: 0 .  simvastatin (ZOCOR) 40  MG tablet, TAKE 1/2 TABLET EVERY DAY., Disp: 45 tablet, Rfl: 3 .  sodium chloride HYPERTONIC 3 % nebulizer solution, Take by nebulization 2 (two) times daily as needed for cough. 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, Disp: 750 mL, Rfl: 12 .  traMADol (ULTRAM) 50 MG tablet, Take 1 tablet (50 mg total) by mouth 2 (two) times daily., Disp: 180 tablet, Rfl: 1 .  zinc gluconate 50 MG tablet, Take 50 mg by mouth daily., Disp: , Rfl:  .  zolpidem (AMBIEN) 5 MG tablet, Take 1 tablet (5 mg total) by mouth at bedtime as needed for sleep., Disp: 90 tablet, Rfl: 0  EXAM:  VITALS per patient if applicable:  GENERAL: alert, oriented, appears well and in no acute distress  HEENT: atraumatic, conjunttiva clear, no obvious abnormalities on inspection of external nose and ears, on video visit inspection of oropharynx no tonsilar edema or exudate, appear to have PND and mild post oropharyngeal erythema  NECK: normal movements of the head and neck  LUNGS: on inspection no signs of respiratory distress, breathing rate appears normal, no obvious gross SOB, gasping or wheezing  CV: no obvious cyanosis  MS: moves all visible extremities without noticeable abnormality  PSYCH/NEURO: pleasant and cooperative, no obvious depression or anxiety, speech and thought processing grossly intact  ASSESSMENT AND PLAN:  Discussed the following assessment and plan:  Sore throat  Laryngitis  -we discussed possible serious and likely etiologies, options for evaluation and workup, limitations of telemedicine visit vs in person visit, treatment, treatment risks and precautions. Pt prefers to treat via telemedicine empirically rather than in person at this moment. Query [facility], [phone] possible vs other. Opted for covid testing and symptomatic care per patient instructions.  Advised to seek prompt in person care if worsening, new symptoms arise, or if is not improving with treatment. Discussed options for inperson care if PCP office not  available. Did let this patient know that I only do telemedicine on Tuesdays and Thursdays for [facility]. Advised to schedule follow up visit with PCP or [facility]  if any further questions or concerns to avoid delays in care.   I discussed the assessment and treatment plan with the patient. The patient was provided an opportunity to ask questions and all were answered. The patient agreed with the plan and demonstrated an understanding of the instructions.     Lucretia Kern, DO

## 2021-01-25 ENCOUNTER — Other Ambulatory Visit: Payer: Self-pay

## 2021-01-25 ENCOUNTER — Telehealth: Payer: Self-pay

## 2021-01-25 ENCOUNTER — Ambulatory Visit (INDEPENDENT_AMBULATORY_CARE_PROVIDER_SITE_OTHER): Payer: Medicare Other | Admitting: Podiatry

## 2021-01-25 ENCOUNTER — Encounter: Payer: Self-pay | Admitting: Podiatry

## 2021-01-25 ENCOUNTER — Telehealth: Payer: Self-pay | Admitting: *Deleted

## 2021-01-25 VITALS — Temp 96.5°F

## 2021-01-25 DIAGNOSIS — L600 Ingrowing nail: Secondary | ICD-10-CM | POA: Diagnosis not present

## 2021-01-25 DIAGNOSIS — M2042 Other hammer toe(s) (acquired), left foot: Secondary | ICD-10-CM

## 2021-01-25 NOTE — Telephone Encounter (Signed)
Can you see if she's due ; >3 months since last injection and make sure we put  it on the list for that day- and let front desk know- so we can get ins. Approval- thanks, ML

## 2021-01-25 NOTE — Telephone Encounter (Signed)
ADDENDUM: DR. Ruel Favors OFFICE CALLED BACK AND CLARIFIED PRE OP INFORMATION NEEDED.   PROCEDURE: RIGHT TOTAL KNEE ARTHROPLASTY ANESTHESIA: SPINAL SURGEON: DR. Ronnie Derby  I WILL UPDATE THE PRE OP PROVIDER.

## 2021-01-25 NOTE — Telephone Encounter (Signed)
3 months from last steroid injection would be august she already had an steroid injection in May , and already has an appointment scheduled for August .for her shoulder injection.

## 2021-01-25 NOTE — Telephone Encounter (Signed)
   [facility] Pre-operative Risk Assessment    Patient Name: [name]  DOB: Aug 22, 1950  MRN: [id number]   HEARTCARE STAFF: - Please ensure there is not already an duplicate clearance open for this procedure. - Under Visit Info/Reason for Call, type in Other and utilize the format Clearance MM/DD/YY or Clearance TBD. Do not use dashes or single digits. - If request is for dental extraction, please clarify the # of teeth to be extracted. - If the patient is currently at the dentist's office, call Pre-Op APP to address. If the patient is not currently in the dentist office, please route to the Pre-Op pool  Request for surgical clearance: PT WALKED IN HER CLEARANCE REQUEST FORM FROM HER APPT WITH THE SURGEON.  1. What type of surgery is being performed? TKA   2. When is this surgery scheduled? TBD   3. What type of clearance is required (medical clearance vs. Pharmacy clearance to hold med vs. Both)? MEDICAL  4. Are there any medications that need to be held prior to surgery and how long? ASA    5. Practice name and name of physician performing surgery? [facility];  CONFIRM SURGEON DOING PROCEDURE  6. What is the office phone number? [phone]   7.   What is the office fax number? [phone]  8.   Anesthesia type (None, local, MAC, general) ? LEFT MESSAGE TO CALL OUR OFFICE BACK TO CONFIRM ANESTHESIA BEING USED    Julaine Hua 01/25/2021, 12:38 PM  _________________________________________________________________   (provider comments below)

## 2021-01-25 NOTE — Telephone Encounter (Signed)
[name] has an appt coming up on June 24th and wants to know if theres any chance she can get injection that day because her shoulder has been bothering her and is in a lot of pain . Please advise if any questions she can be reached at [phone] or you can let me know what I can advise her .

## 2021-01-25 NOTE — Telephone Encounter (Signed)
   Attempted to call the patient in regards to cardiac clearance however phone call went straight to VM 01/25/21

## 2021-01-25 NOTE — Progress Notes (Signed)
Subjective:   Patient ID: [name], female   DOB: 70 y.o.   MRN: [id number]   HPI Patient presents concerned about small bumps on the third digits of both feet that has been thick several months ago and ingrown toenail deformity of the big toe right foot.  Concerned about appearance and states that the pain has resolved after the surgery we did   ROS      Objective:  Physical Exam  Neurovascular status found to be intact muscle strength adequate with patient noted to have some small areas of irritation around the distal incision sites digit 3 bilateral but excellent position of the toe resolution of callus with incurvated left hallux medial border     Assessment:  This may be some kind of a reactive type skin formation secondary to the repositioning of the digit but is currently not painful and is concerned cosmetically along with ingrown toenail     Plan:  H&P reviewed condition and I have recommended to give this time and if not better we will consider steroid injections and for the ingrown we discussed correction of the ingrown I educated her on this and we will get a hold off and today I debrided the nailbed to take pressure off of it and advised her on soaks.  Reappoint as needed and may require further treatment

## 2021-01-26 ENCOUNTER — Telehealth: Payer: Self-pay | Admitting: Physical Medicine and Rehabilitation

## 2021-01-26 NOTE — Telephone Encounter (Signed)
Patient returned phone call, name and birth date confirmed. Went over CPAP report/ Dr Juanetta Gosling note with patient. All questions answered and patient expressed full understanding. Nothing further needed at this time.

## 2021-01-26 NOTE — Telephone Encounter (Signed)
Per pre op provider I called the pt and left her a message that clearance has been faxed to Dr. Ruel Favors office with recommendations. If any questions may reach to Dr. Ruel Favors office in regards to clearance.

## 2021-01-26 NOTE — Telephone Encounter (Signed)
Please contact patient and let them know that their preoperative cardiac evaluation form has been returned to Dr. Ronnie Derby.  Her TKA may now be scheduled by requesting office.  Our office has no further questions at this time.  Thank you.  Jossie Ng. Jody Aguinaga NP-C    01/26/2021, 2:45 PM [place] [address] Office [phone] Fax [phone]

## 2021-01-26 NOTE — Telephone Encounter (Signed)
Patient is returning call from 01/25/21 regarding clearance.

## 2021-01-26 NOTE — Telephone Encounter (Signed)
   Primary Cardiologist: Larae Grooms, MD  Chart reviewed as part of pre-operative protocol coverage. Given past medical history and time since last visit, based on ACC/AHA guidelines, [name] would be at acceptable risk for the planned procedure without further cardiovascular testing.   Her RCRI is class III risk, 6.6% risk of major cardiac event.  Her aspirin may be held for 5-7 days prior to her procedure.  Please resume as soon as hemostasis is achieved.  I will route this recommendation to the requesting party via Epic fax function and remove from pre-op pool.  Please call with questions.  Jossie Ng. Maripaz Mullan NP-C    01/26/2021, 8:48 AM [place] [address] Office [phone] Fax [phone]

## 2021-01-26 NOTE — Telephone Encounter (Signed)
Pt would like to have lidocaine shoulder injection at next apt 6/24 please advise?  She is scheduled for steroid injection in August. States pain is not in rotator Cuff, but in shoulder and neck.

## 2021-01-27 ENCOUNTER — Other Ambulatory Visit: Payer: Self-pay | Admitting: Physician Assistant

## 2021-01-27 DIAGNOSIS — Z1231 Encounter for screening mammogram for malignant neoplasm of breast: Secondary | ICD-10-CM

## 2021-02-01 ENCOUNTER — Encounter: Payer: Self-pay | Admitting: Internal Medicine

## 2021-02-01 ENCOUNTER — Ambulatory Visit (INDEPENDENT_AMBULATORY_CARE_PROVIDER_SITE_OTHER): Payer: Medicare Other | Admitting: Internal Medicine

## 2021-02-01 ENCOUNTER — Ambulatory Visit (INDEPENDENT_AMBULATORY_CARE_PROVIDER_SITE_OTHER): Payer: Medicare Other | Admitting: Family Medicine

## 2021-02-01 VITALS — BP 120/70 | HR 62 | Ht 66.0 in | Wt 220.0 lb

## 2021-02-01 DIAGNOSIS — Z860100 Personal history of colon polyps, unspecified: Secondary | ICD-10-CM

## 2021-02-01 DIAGNOSIS — K5909 Other constipation: Secondary | ICD-10-CM

## 2021-02-01 DIAGNOSIS — K2940 Chronic atrophic gastritis without bleeding: Secondary | ICD-10-CM

## 2021-02-01 DIAGNOSIS — K219 Gastro-esophageal reflux disease without esophagitis: Secondary | ICD-10-CM

## 2021-02-01 MED ORDER — PANTOPRAZOLE SODIUM 40 MG PO TBEC: 40.0000 mg | DELAYED_RELEASE_TABLET | Freq: Every day | ORAL | 11 refills | Status: DC

## 2021-02-01 MED ORDER — PANTOPRAZOLE SODIUM 40 MG PO TBEC: 40.0000 mg | DELAYED_RELEASE_TABLET | Freq: Every day | ORAL | 3 refills | Status: DC

## 2021-02-01 NOTE — Progress Notes (Signed)
Subjective:    Patient ID: [name], female    DOB: 06/24/1951, 70 y.o.   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 difficile colitis, prior breast cancer, history of V. tach with an ICD in place, bronchiectasis hypertension and sleep apnea who is here for follow-up.  She is here alone today and was last seen in October 2020 by Tye Savoy, NP.  She reports that on the whole she is doing well.  She ran out of her pantoprazole on Memorial Day weekend.  She had been taking 40 mg daily.  Since this time she has had return of her heartburn symptom as well as some hoarseness to her voice.  She had 1 episode of substernal chest pain radiating to her throat associated with heartburn.  She drank cold water which helped relieve this symptom.  It has not recurred and it does not have an exertional component nor is it associated with dyspnea.  She has had a mild nausea without vomiting.  Her appetite has been very good without early satiety.  Bowel movements are regular as long as she is taking MiraLAX though at times her stools seem a little pasty.  No blood in stool or melena.  No recent abdominal pain.   Review of Systems As per HPI, otherwise negative  Current Medications, Allergies, Past Medical History, Past Surgical History, Family History and Social History were reviewed in [employer] record.    Objective:   Physical Exam BP 120/70   Pulse 62   Ht 5\' 6"  (1.676 m)   Wt 220 lb (99.8 kg)   LMP  (LMP Unknown)   BMI 35.51 kg/m  Gen: awake, alert, NAD HEENT: anicteric,  CV: RRR, no mrg Pulm: CTA b/l Abd: soft, NT/ND, +BS throughout Ext: no c/c/e Neuro: nonfocal  CBC    Component Value Date/Time   WBC 3.5 (L) 12/20/2020 1045   RBC  4.16 12/20/2020 1045   HGB 12.6 12/20/2020 1045   HGB 13.3 10/01/2019 1408   HGB 12.4 12/01/2014 0845   HCT 38.6 12/20/2020 1045   HCT 59.0 (H) 10/01/2019 1409   HCT 38.8 12/01/2014 0845   PLT 190.0 12/20/2020 1045   PLT 212 10/01/2019 1408   MCV 92.8 12/20/2020 1045   MCV 92 10/01/2019 1408   MCV 88.5 12/01/2014 0845   MCH 30.0 10/01/2019 1408   MCH 29.5 02/27/2017 1518   MCHC 32.7 12/20/2020 1045   RDW 13.2 12/20/2020 1045   RDW 11.8 10/01/2019 1408   RDW 12.7 12/01/2014 0845   LYMPHSABS 0.9 12/20/2020 1045   LYMPHSABS 1.0 10/01/2019 1408   LYMPHSABS 1.0 12/01/2014 0845   MONOABS 0.3 12/20/2020 1045   MONOABS 0.3 12/01/2014 0845   EOSABS 0.1 12/20/2020 1045   EOSABS 0.3 10/01/2019 1408   EOSABS 0.1 03/13/2007 1249   BASOSABS 0.0 12/20/2020 1045   BASOSABS 0.1 10/01/2019 1408   BASOSABS 0.0 12/01/2014 0845   CMP     Component Value Date/Time   NA 140 12/20/2020 1045   NA 140 10/01/2019 1408   NA 141 12/01/2014 0847   K 4.2 12/20/2020 1045   K 3.8 12/01/2014 0847   CL 105 12/20/2020 1045   CO2 27 12/20/2020 1045   CO2 26 12/01/2014 0847   GLUCOSE 94 12/20/2020 1045  GLUCOSE 175 (H) 12/01/2014 0847   BUN 15 12/20/2020 1045   BUN 10 10/01/2019 1408   BUN 10.6 12/01/2014 0847   CREATININE 0.86 12/20/2020 1045   CREATININE 0.82 02/27/2017 1518   CREATININE 0.8 12/01/2014 0847   CALCIUM 9.7 12/20/2020 1045   CALCIUM 9.8 12/01/2014 0847   PROT 6.8 12/07/2019 1053   PROT 7.1 10/01/2019 1408   PROT 7.5 12/01/2014 0847   ALBUMIN 4.2 12/07/2019 1053   ALBUMIN 4.1 10/01/2019 1408   ALBUMIN 3.9 12/01/2014 0847   AST 37 12/07/2019 1053   AST 29 12/01/2014 0847   ALT 41 (H) 12/07/2019 1053   ALT 33 12/01/2014 0847   ALKPHOS 102 12/07/2019 1053   ALKPHOS 127 12/01/2014 0847   BILITOT 0.5 12/07/2019 1053   BILITOT 0.3 10/01/2019 1408   BILITOT 0.42 12/01/2014 0847   GFRNONAA 71 10/01/2019 1408   GFRNONAA 74 02/27/2017 1518   GFRAA 82 10/01/2019 1408   GFRAA 85  02/27/2017 1518       Assessment & Plan:  70 year old female known to me with a history of GERD, 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 difficile colitis, prior breast cancer, history of V. tach with an ICD in place, bronchiectasis hypertension and sleep apnea who is here for follow-up.   GERD/hiatal hernia --she has had recurrent GERD symptoms as well as some LPR type symptom with hoarseness since her pantoprazole prescription ran out.  There are no new alarm symptoms and I expect both of these to improve with resumption of pantoprazole --Resume pantoprazole 40 mg daily; I asked her to let me know if her GERD and hoarseness symptoms fail to improve in several weeks with resuming pantoprazole  2.  History of atrophic gastritis with metaplasia --no history of gastric dysplasia.  No family history of gastric cancer.  We discussed how there are no solid guidelines for surveillance endoscopy in patients with gastritis.  I think it is reasonable to repeat upper endoscopy for topographic mapping at the time of her next colonoscopy.  She is in agreement.  3.  History of colon polyps --her last colonoscopy was in 2019, surveillance colonoscopy recommended May 2024  4.  Constipation --chronic for her and responsive to MiraLAX.  Given the stool characteristics I would recommend she try to increase MiraLAX to either 17 g every other day alternating with 34 g for 34 g daily if needed. --MiraLAX 1-2 times daily  Annual follow-up, sooner if needed  30 minutes total spent today including patient facing time, coordination of care, reviewing medical history/procedures/pertinent radiology studies, and documentation of the encounter.

## 2021-02-01 NOTE — Patient Instructions (Addendum)
If you are age 72 or older, your body mass index should be between 23-30. Your Body mass index is 35.51 kg/m. If this is out of the aforementioned range listed, please consider follow up with your Primary Care Provider. _________________________________________________________  The [facility] providers would like to encourage you to use [facility] to communicate with providers for non-urgent requests or questions.  Due to long hold times on the telephone, sending your provider a message by [facility] may be a faster and more efficient way to get a response.  Please allow 48 business hours for a response.  Please remember that this is for non-urgent requests.  __________________________________________________________  CONTINUE: pantorazole 40mg  one tablet daily  CONTINUE: Miralax 17 grams- 2 doses daily to 2 doses every other day.  You can titrate your doses depending on what you feel works best for you.  We will follow up with your in our office in 1 year (June 2023).  We will contact you with an appointment.    Please call our office if GERD symptoms do not resolve.  Thank you for entrusting me with your care and choosing [facility].  Dr Hilarie Fredrickson

## 2021-02-10 ENCOUNTER — Encounter
Payer: Medicare Other | Attending: Physical Medicine and Rehabilitation | Admitting: Physical Medicine and Rehabilitation

## 2021-02-10 ENCOUNTER — Encounter: Payer: Self-pay | Admitting: Physical Medicine and Rehabilitation

## 2021-02-10 ENCOUNTER — Other Ambulatory Visit: Payer: Self-pay

## 2021-02-10 VITALS — BP 115/73 | HR 63 | Temp 98.1°F | Ht 66.0 in | Wt 222.4 lb

## 2021-02-10 DIAGNOSIS — M25561 Pain in right knee: Secondary | ICD-10-CM

## 2021-02-10 DIAGNOSIS — M25511 Pain in right shoulder: Secondary | ICD-10-CM | POA: Diagnosis present

## 2021-02-10 DIAGNOSIS — Z5181 Encounter for therapeutic drug level monitoring: Secondary | ICD-10-CM | POA: Insufficient documentation

## 2021-02-10 DIAGNOSIS — M25562 Pain in left knee: Secondary | ICD-10-CM | POA: Diagnosis present

## 2021-02-10 DIAGNOSIS — M7918 Myalgia, other site: Secondary | ICD-10-CM | POA: Diagnosis present

## 2021-02-10 DIAGNOSIS — Z79891 Long term (current) use of opiate analgesic: Secondary | ICD-10-CM | POA: Insufficient documentation

## 2021-02-10 DIAGNOSIS — G8929 Other chronic pain: Secondary | ICD-10-CM | POA: Diagnosis present

## 2021-02-10 NOTE — Progress Notes (Signed)
Patient is a 70 yr old R handed female with hx of  Bronchiesctasis, CHF, prediabetes- with A1c of 5.3- - not on blood thinners, has a defibrillator, GERD, and fibromyalgia issues- dx'd 10 years ago.  Here for f/u on chronic pain and trigger oint injections.  Last got subacromial shoulder injections B/L on 01/02/21.    Hasn't scheduled knee surgery yet, but has sent in forms for clearance.  Waiting for Dr Morene Rankins to get back from maternity leave for clearance.  Has gotten clearance from pulmonary and cardiology already.  Doesn't want to  wait until she gets back.   Feels like needs trigger point injection.  R shoulder pain is still a big issue in the [facility] joint where she points to as well as Scalenes and upper traps on R.   No other issues.  Back is tired.  Been up cooking til 2am- grandson's grandfather died and then grandmother died on day of funeral .   Sleep son the right side- normal bamboo pillow- soft.     Plan:  Patient here for trigger point injections for  Consent done and on chart.  Cleaned areas with alcohol and injected using a 27 gauge 1.5 inch needle  Injected 3cc Using 1% Lidocaine with no EPI  Upper traps R side Levators R side Posterior scalenes- R side Middle scalenes- R side Splenius Capitus- didn't need it Pectoralis Major- didn't need it Rhomboids R side Infraspinatus Teres Major/minor- R side Thoracic paraspinals Lumbar paraspinals Other injections-    Patient's level of pain prior was 7/10 Current level of pain after injections is 5/10- 1 minute after injections.   There was no bleeding or complications.  Patient was advised to drink a lot of water on day after injections to flush system Will have increased soreness for 12-48 hours after injections.  Can use Lidocaine patches the day AFTER injections Can use theracane on day of injections in places didn't inject Can use heating pad 4-6 hours AFTER injections  2. Discussed pillow options at  length- I suggest foam or memory, however, whatever keep you neck/shoulder at 90 degrees.   3. F/U as scheduled.   4. Theracane hold pressure 2-4 minutes on muscles that hurt esp in R side of neck/shoulder- - no massage- you tube videos-

## 2021-02-10 NOTE — Patient Instructions (Signed)
Plan:  Patient here for trigger point injections for  Consent done and on chart.  Cleaned areas with alcohol and injected using a 27 gauge 1.5 inch needle  Injected 3cc Using 1% Lidocaine with no EPI  Upper traps R side Levators R side Posterior scalenes- R side Middle scalenes- R side Splenius Capitus- didn't need it Pectoralis Major- didn't need it Rhomboids R side Infraspinatus Teres Major/minor- R side Thoracic paraspinals Lumbar paraspinals Other injections-    Patient's level of pain prior was 7/10 Current level of pain after injections is 5/10- 1 minute after injections.   There was no bleeding or complications.  Patient was advised to drink a lot of water on day after injections to flush system Will have increased soreness for 12-48 hours after injections.  Can use Lidocaine patches the day AFTER injections Can use theracane on day of injections in places didn't inject Can use heating pad 4-6 hours AFTER injections  2. Discussed pillow options at length- I suggest foam or memory, however, whatever keep you neck/shoulder at 90 degrees.   3. F/U as scheduled.   4. Theracane hold pressure 2-4 minutes on muscles that hurt esp in R side of neck/shoulder- - no massage- you tube videos-

## 2021-02-14 LAB — TOXASSURE SELECT,+ANTIDEPR,UR

## 2021-02-15 ENCOUNTER — Encounter (INDEPENDENT_AMBULATORY_CARE_PROVIDER_SITE_OTHER): Payer: Self-pay | Admitting: Family Medicine

## 2021-02-15 ENCOUNTER — Ambulatory Visit (INDEPENDENT_AMBULATORY_CARE_PROVIDER_SITE_OTHER): Payer: Medicare Other | Admitting: Family Medicine

## 2021-02-15 ENCOUNTER — Other Ambulatory Visit: Payer: Self-pay

## 2021-02-15 VITALS — BP 111/68 | HR 72 | Temp 97.8°F | Ht 66.0 in | Wt 215.0 lb

## 2021-02-15 DIAGNOSIS — E559 Vitamin D deficiency, unspecified: Secondary | ICD-10-CM

## 2021-02-15 DIAGNOSIS — E039 Hypothyroidism, unspecified: Secondary | ICD-10-CM | POA: Diagnosis not present

## 2021-02-15 DIAGNOSIS — Z6836 Body mass index (BMI) 36.0-36.9, adult: Secondary | ICD-10-CM | POA: Diagnosis not present

## 2021-02-15 DIAGNOSIS — G4700 Insomnia, unspecified: Secondary | ICD-10-CM

## 2021-02-15 DIAGNOSIS — E8881 Metabolic syndrome: Secondary | ICD-10-CM | POA: Diagnosis not present

## 2021-02-15 DIAGNOSIS — R7303 Prediabetes: Secondary | ICD-10-CM

## 2021-02-16 ENCOUNTER — Telehealth: Payer: Self-pay | Admitting: *Deleted

## 2021-02-16 LAB — CBC WITH DIFFERENTIAL/PLATELET
Basophils Absolute: 0 10*3/uL (ref 0.0–0.2)
Basos: 0 %
EOS (ABSOLUTE): 0.1 10*3/uL (ref 0.0–0.4)
Eos: 2 %
Hematocrit: 41.9 % (ref 34.0–46.6)
Hemoglobin: 13.5 g/dL (ref 11.1–15.9)
Immature Grans (Abs): 0 10*3/uL (ref 0.0–0.1)
Immature Granulocytes: 1 %
Lymphocytes Absolute: 0.8 10*3/uL (ref 0.7–3.1)
Lymphs: 19 %
MCH: 30.3 pg (ref 26.6–33.0)
MCHC: 32.2 g/dL (ref 31.5–35.7)
MCV: 94 fL (ref 79–97)
Monocytes Absolute: 0.4 10*3/uL (ref 0.1–0.9)
Monocytes: 10 %
Neutrophils Absolute: 3.1 10*3/uL (ref 1.4–7.0)
Neutrophils: 68 %
Platelets: 212 10*3/uL (ref 150–450)
RBC: 4.46 x10E6/uL (ref 3.77–5.28)
RDW: 11.9 % (ref 11.7–15.4)
WBC: 4.5 10*3/uL (ref 3.4–10.8)

## 2021-02-16 LAB — COMPREHENSIVE METABOLIC PANEL
ALT: 47 IU/L — ABNORMAL HIGH (ref 0–32)
AST: 50 IU/L — ABNORMAL HIGH (ref 0–40)
Albumin/Globulin Ratio: 1.5 (ref 1.2–2.2)
Albumin: 4.3 g/dL (ref 3.7–4.7)
Alkaline Phosphatase: 115 IU/L (ref 44–121)
BUN/Creatinine Ratio: 16 (ref 12–28)
BUN: 13 mg/dL (ref 8–27)
Bilirubin Total: 0.4 mg/dL (ref 0.0–1.2)
CO2: 25 mmol/L (ref 20–29)
Calcium: 9.9 mg/dL (ref 8.7–10.3)
Chloride: 104 mmol/L (ref 96–106)
Creatinine, Ser: 0.8 mg/dL (ref 0.57–1.00)
Globulin, Total: 2.8 g/dL (ref 1.5–4.5)
Glucose: 139 mg/dL — ABNORMAL HIGH (ref 65–99)
Potassium: 4.4 mmol/L (ref 3.5–5.2)
Sodium: 143 mmol/L (ref 134–144)
Total Protein: 7.1 g/dL (ref 6.0–8.5)
eGFR: 78 mL/min/{1.73_m2} (ref >59)

## 2021-02-16 LAB — HEMOGLOBIN A1C
Est. average glucose Bld gHb Est-mCnc: 114 mg/dL
Hgb A1c MFr Bld: 5.6 % (ref 4.8–5.6)

## 2021-02-16 LAB — T4, FREE: Free T4: 1.38 ng/dL (ref 0.82–1.77)

## 2021-02-16 LAB — VITAMIN D 25 HYDROXY (VIT D DEFICIENCY, FRACTURES): Vit D, 25-Hydroxy: 56.9 ng/mL (ref 30.0–100.0)

## 2021-02-16 LAB — INSULIN, RANDOM: INSULIN: 60.3 u[IU]/mL — ABNORMAL HIGH (ref 2.6–24.9)

## 2021-02-16 LAB — TSH: TSH: 1.15 u[IU]/mL (ref 0.450–4.500)

## 2021-02-16 NOTE — Telephone Encounter (Signed)
Urine drug screen for this encounter is consistent for prescribed medication, but is also still positive for THC. The level is much lower than the last UDS in February but is still positive.

## 2021-02-16 NOTE — Progress Notes (Signed)
Chief Complaint:   OBESITY [name] is here to discuss her progress with her obesity treatment plan along with follow-up of her obesity related diagnoses. See Medical Weight Management Flowsheet for complete bioelectrical impedance results.  Today's visit was #: 20 Starting weight: 223 lbs Starting date: 10/01/2019 Today's weight: 215 lbs Today's date: 02/15/2021 Weight change since last visit: 1 lb Total lbs lost to date: 8 lbs Body mass index is 34.7 kg/m.  Total weight loss percentage to date: -3.59%  Nutrition Plan: practicing portion control and making smarter food choices, such as increasing vegetables and decreasing simple carbohydrates. Activity:  Increased yard work.  Assessment/Plan:   1. Metabolic syndrome Starting goal: Lose 7-10% of starting weight. She will continue to focus on protein-rich, low simple carbohydrate foods. We reviewed the importance of hydration, regular exercise for stress reduction, and restorative sleep.  We will continue to check lab work every 3 months, with 10% weight loss, or should any other concerns arise.  - CBC with Differential/Platelet - Comprehensive metabolic panel - Insulin, random - Hemoglobin A1c  2. Acquired hypothyroidism Course: Controlled. Medication: levothyroxine 50 mcg daily.   Plan: Patient was instructed not to take MVM or iron within 4 hours of taking thyroid medications.  We will continue to monitor alongside Endocrinology/PCP as it relates to her weight loss journey.   Lab Results  Component Value Date   TSH 1.150 02/15/2021   - TSH - T4, free  3. Insomnia, unspecified type This is moderately controlled.  Current treatment: trazodone 25-50 mg at bedtime as needed for sleep.  Plan: Recommend sleep hygiene measures including regular sleep schedule, optimal sleep environment, and relaxing presleep rituals.   - traZODone (DESYREL) 50 MG tablet; Take 0.5-1 tablets (25-50 mg total) by mouth at bedtime as needed for  sleep.  Dispense: 90 tablet; Refill: 0  4. Prediabetes At goal. Goal is HgbA1c < 5.7.  Medication: None.    Plan:  She will continue to focus on protein-rich, low simple carbohydrate foods. We reviewed the importance of hydration, regular exercise for stress reduction, and restorative sleep.   Lab Results  Component Value Date   HGBA1C 5.6 02/15/2021   Lab Results  Component Value Date   INSULIN 60.3 (H) 02/15/2021   INSULIN 15.6 10/01/2019   - CBC with Differential/Platelet - Comprehensive metabolic panel - Insulin, random - Hemoglobin A1c  5. Vitamin D deficiency At goal.  She is taking OTC vitamin D 5,000 IU daily.  Plan: Continue current OTC vitamin D supplementation.  Follow-up for routine testing of Vitamin D, at least 2-3 times per year to avoid over-replacement.  Lab Results  Component Value Date   VD25OH 56.9 02/15/2021   VD25OH 61.53 12/07/2019   VD25OH 50.9 10/01/2019   - VITAMIN D 25 Hydroxy (Vit-D Deficiency, Fractures)  6. Obesity, current BMI 34.7  Course: [name] is currently in the action stage of change. As such, her goal is to continue with weight loss efforts.   Nutrition goals: She has agreed to practicing portion control and making smarter food choices, such as increasing vegetables and decreasing simple carbohydrates.   Exercise goals:  As is.  Behavioral modification strategies: increasing lean protein intake, decreasing simple carbohydrates, increasing vegetables, and increasing water intake.  [name] has agreed to follow-up with our clinic in 4 weeks. She was informed of the importance of frequent follow-up visits to maximize her success with intensive lifestyle modifications for her multiple health conditions.   Objective:   Blood  pressure 111/68, pulse 72, temperature 97.8 F (36.6 C), temperature source Oral, height 5\' 6"  (1.676 m), weight 215 lb (97.5 kg), SpO2 95 %. Body mass index is 34.7 kg/m.  General: Cooperative, alert, well  developed, in no acute distress. HEENT: Conjunctivae and lids unremarkable. Cardiovascular: Regular rhythm.  Lungs: Normal work of breathing. Neurologic: No focal deficits.   Lab Results  Component Value Date   CREATININE 0.80 02/15/2021   BUN 13 02/15/2021   NA 143 02/15/2021   K 4.4 02/15/2021   CL 104 02/15/2021   CO2 25 02/15/2021   Lab Results  Component Value Date   ALT 47 (H) 02/15/2021   AST 50 (H) 02/15/2021   ALKPHOS 115 02/15/2021   BILITOT 0.4 02/15/2021   Lab Results  Component Value Date   HGBA1C 5.6 02/15/2021   HGBA1C 5.3 12/07/2019   HGBA1C 5.7 (H) 10/01/2019   HGBA1C 5.4 12/04/2018   HGBA1C 5.6 11/27/2017   Lab Results  Component Value Date   INSULIN 60.3 (H) 02/15/2021   INSULIN 15.6 10/01/2019   Lab Results  Component Value Date   TSH 1.150 02/15/2021   Lab Results  Component Value Date   CHOL 178 12/20/2020   HDL 59.30 12/20/2020   LDLCALC 100 (H) 12/20/2020   LDLDIRECT 141.8 07/04/2007   TRIG 92.0 12/20/2020   CHOLHDL 3 12/20/2020   Lab Results  Component Value Date   VD25OH 56.9 02/15/2021   VD25OH 61.53 12/07/2019   VD25OH 50.9 10/01/2019   Lab Results  Component Value Date   WBC 4.5 02/15/2021   HGB 13.5 02/15/2021   HCT 41.9 02/15/2021   MCV 94 02/15/2021   PLT 212 02/15/2021   Lab Results  Component Value Date   IRON 89 10/01/2019   TIBC 335 10/01/2019   FERRITIN 178 (H) 10/01/2019   Obesity Behavioral Intervention:   Approximately 15 minutes were spent on the discussion below.  ASK: We discussed the diagnosis of obesity with [name] today and [name] agreed to give [place] permission to discuss obesity behavioral modification therapy today.  ASSESS: [name] has the diagnosis of obesity and her BMI today is 34.7. [name] is in the action stage of change.   ADVISE: [name] was educated on the multiple health risks of obesity as well as the benefit of weight loss to improve her health. She was advised of the need for long term  treatment and the importance of lifestyle modifications to improve her current health and to decrease her risk of future health problems.  AGREE: Multiple dietary modification options and treatment options were discussed and [name] agreed to follow the recommendations documented in the above note.  ARRANGE: [name] was educated on the importance of frequent visits to treat obesity as outlined per CMS and USPSTF guidelines and agreed to schedule her next follow up appointment today.  Attestation Statements:   Reviewed by clinician on day of visit: allergies, medications, problem list, medical history, surgical history, family history, social history, and previous encounter notes.  I, Water quality scientist, CMA, am acting as transcriptionist for Briscoe Deutscher, DO  I have reviewed the above documentation for accuracy and completeness, and I agree with the above. Briscoe Deutscher, DO

## 2021-02-17 NOTE — Telephone Encounter (Signed)
FYI- the levels it came back at- it's most likely from CBD oil- which has <#% of THC in it- I'm ok with that- we've discussed her using CBD oil- which is legal ML

## 2021-02-21 MED ORDER — TRAZODONE HCL 50 MG PO TABS: 25.0000 mg | ORAL_TABLET | Freq: Every evening | ORAL | 0 refills | Status: DC | PRN

## 2021-02-23 ENCOUNTER — Encounter (INDEPENDENT_AMBULATORY_CARE_PROVIDER_SITE_OTHER): Payer: Self-pay | Admitting: Family Medicine

## 2021-02-27 ENCOUNTER — Encounter: Payer: Self-pay | Admitting: Physician Assistant

## 2021-02-27 NOTE — Telephone Encounter (Signed)
Pt last seen by Dr. Wallace.  

## 2021-02-28 NOTE — Telephone Encounter (Signed)
Spoke to pt told her if you need to have surgical clearance papers filled out with [place] you will need to schedule an appt with Dr. Jerline Pain we have not seen you in person since 08/2020. Told her Dr. Carlis Abbott said if you have been cleared by Cardiology then we do not need to see you. Pt verbalized understanding and said she was given 3 forms to be filled out and one was PCP. Pt said she will contact surgeon and see if she needs to see [place] since Cardiology cleared her. Told her okay if you need form filled out just call and schedule appt with Dr. Jerline Pain. Pt verbalized understanding.

## 2021-03-01 ENCOUNTER — Ambulatory Visit (INDEPENDENT_AMBULATORY_CARE_PROVIDER_SITE_OTHER): Payer: Medicare Other

## 2021-03-01 DIAGNOSIS — I472 Ventricular tachycardia: Secondary | ICD-10-CM

## 2021-03-01 DIAGNOSIS — I4729 Other ventricular tachycardia: Secondary | ICD-10-CM

## 2021-03-01 LAB — CUP PACEART REMOTE DEVICE CHECK
Battery Remaining Longevity: 30 mo
Battery Remaining Percentage: 33 %
Brady Statistic RV Percent Paced: 0 %
Date Time Interrogation Session: 20220713030000
HighPow Impedance: 70 Ohm
Implantable Lead Implant Date: 20050603
Implantable Lead Location: 753860
Implantable Lead Model: 185
Implantable Lead Serial Number: 116340
Implantable Pulse Generator Implant Date: 20110505
Lead Channel Impedance Value: 615 Ohm
Lead Channel Pacing Threshold Amplitude: 0.6 V
Lead Channel Pacing Threshold Pulse Width: 0.4 ms
Lead Channel Setting Pacing Amplitude: 2.4 V
Lead Channel Setting Pacing Pulse Width: 0.4 ms
Lead Channel Setting Sensing Sensitivity: 0.4 mV
Pulse Gen Serial Number: 266301

## 2021-03-06 ENCOUNTER — Ambulatory Visit (INDEPENDENT_AMBULATORY_CARE_PROVIDER_SITE_OTHER): Payer: Medicare Other | Admitting: Podiatry

## 2021-03-06 ENCOUNTER — Other Ambulatory Visit: Payer: Self-pay

## 2021-03-06 ENCOUNTER — Encounter: Payer: Self-pay | Admitting: Podiatry

## 2021-03-06 DIAGNOSIS — M778 Other enthesopathies, not elsewhere classified: Secondary | ICD-10-CM | POA: Diagnosis not present

## 2021-03-06 DIAGNOSIS — L910 Hypertrophic scar: Secondary | ICD-10-CM

## 2021-03-06 NOTE — Progress Notes (Signed)
Subjective:   Patient ID: [name], female   DOB: 70 y.o.   MRN: [id number]   HPI Patient presents stating that she traumatized her left third digit and the skin has gotten thicker and she hit it several weeks ago.  States it is irritated and swollen    ROS      Objective:  Physical Exam  Neurovascular status intact third digit in good alignment but there is some hypertrophied tissue with patient having had history of keloids with trauma that occurred to the inner phalangeal joint with incision site that is healed well with some enlargement around the sides     Assessment:  Inflammatory process with possible low-grade capsulitis along with possible low-grade keloid formation     Plan:  H&P anesthetized the digit and carefully put steroid into the inner phalangeal joint 2 mg dexamethasone Kenalog and debrided the lesions and applied sterile dressing.  This may still give her trouble and she will be seen back if symptoms persist or other issues were to occur and she is hopefully can to be careful of any trauma to the toe

## 2021-03-07 ENCOUNTER — Encounter: Payer: Self-pay | Admitting: Family Medicine

## 2021-03-07 ENCOUNTER — Ambulatory Visit (INDEPENDENT_AMBULATORY_CARE_PROVIDER_SITE_OTHER): Payer: Medicare Other | Admitting: Family Medicine

## 2021-03-07 VITALS — BP 105/68 | HR 65 | Temp 98.0°F | Ht 66.0 in | Wt 220.4 lb

## 2021-03-07 DIAGNOSIS — Z01818 Encounter for other preprocedural examination: Secondary | ICD-10-CM | POA: Diagnosis not present

## 2021-03-07 DIAGNOSIS — M170 Bilateral primary osteoarthritis of knee: Secondary | ICD-10-CM | POA: Diagnosis not present

## 2021-03-07 DIAGNOSIS — J479 Bronchiectasis, uncomplicated: Secondary | ICD-10-CM | POA: Diagnosis not present

## 2021-03-07 DIAGNOSIS — I472 Ventricular tachycardia, unspecified: Secondary | ICD-10-CM

## 2021-03-07 DIAGNOSIS — I428 Other cardiomyopathies: Secondary | ICD-10-CM | POA: Diagnosis not present

## 2021-03-07 DIAGNOSIS — E038 Other specified hypothyroidism: Secondary | ICD-10-CM

## 2021-03-07 DIAGNOSIS — E781 Pure hyperglyceridemia: Secondary | ICD-10-CM

## 2021-03-07 NOTE — Assessment & Plan Note (Signed)
Found to have borderline elevated LDL on last check.  She is working with lifestyle modifications.  They consider changing dose of her statin at some point in the future but can continue current dose for now.

## 2021-03-07 NOTE — Assessment & Plan Note (Signed)
Recently had ICD interrogated which was normal per patient report.  Regular rate and rhythm today.

## 2021-03-07 NOTE — Assessment & Plan Note (Signed)
Continue levothyroxine 50 mcg daily.  Latest TSH at goal.

## 2021-03-07 NOTE — Assessment & Plan Note (Signed)
Follows with cardiology.  Has recently been cleared by them for surgery.

## 2021-03-07 NOTE — Progress Notes (Signed)
Chief Complaint:  [name] is a 70 y.o. female who presents today for consultation for surgical clearance at the request of Dr Ronnie Derby.  Assessment/Plan:  Encounter For Surgical Clearance Patient has already received clearance from cardiology and pulmonology for her upcoming right TKA.  A1c and BMI within goal.  Recently had labs which were all normal.  ICD also has been recently interrogated which was reportedly normal per patient.  Will clear patient to have surgery from medical and surgical standpoint.  Chronic Problems Addressed Today: Primary osteoarthritis of both knees Will be undergoing right-sided TKA soon.  Uses Tylenol as needed.  V-tach [facility]) Recently had ICD interrogated which was normal per patient report.  Regular rate and rhythm today.  Hyperlipidemia Found to have borderline elevated LDL on last check.  She is working with lifestyle modifications.  They consider changing dose of her statin at some point in the future but can continue current dose for now.  Hypothyroidism Continue levothyroxine 50 mcg daily.  Latest TSH at goal.  NICM (nonischemic cardiomyopathy) ([place]) Follows with cardiology.  Has recently been cleared by them for surgery.  Bronchiectasis without complication (HCC) Stable.  Normal lung exam today.  Has been cleared by pulmonology for surgery.     Subjective:  HPI:  Patient with longstanding history of osteoarthritis in bilateral knees.  She will have a right total knee arthroplasty soon. Left knee was completed with Dr. Ronnie Derby 02/13/2016 -  she reports that that leg is doing well with no issues.  See A/P for status of chronic conditions.  She has cardiology and pulmonology.  She has already seen both of these have cleared her for surgery.  Her orthopedic surgeon told her that she would need to have clearance from her primary care doctor as well.  Recently has had labs and ICD interrogation which was normal.  Patient follows with  Dr. Juleen China and continues to do so from weight management. She says she has not being doing well with trying to control her weight.  She wondered if her cholesterol medicine should be increased but everything has been fine - she will plan to monitor at home.  ROS: Per HPI, otherwise a complete review of systems was negative.   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   Bronchiectasis ([place])    Cerebral aneurysm, nonruptured    had a clip put in   CHF (congestive heart failure) ([place])    Clostridium difficile infection    Depressive disorder, not elsewhere classified    Diverticulosis of colon (without mention of hemorrhage)    Esophageal reflux    Fatty liver    Fibromyalgia    Gastritis    GERD (gastroesophageal reflux disease)    GI bleed 2004   Glaucoma    Hiatal hernia    Hyperlipidemia    Hypertension    Hypothyroidism    Internal hemorrhoids    Joint pain    Obstructive sleep apnea (adult) (pediatric)    Osteoarthritis    Ostium secundum type atrial septal defect    Other chronic nonalcoholic liver disease    Other pulmonary embolism and infarction    Palpitations    Paroxysmal ventricular tachycardia ([place])    Personal history of radiation  therapy    Pneumonia    history   Presence of permanent cardiac pacemaker    PUD (peptic ulcer disease)    Radiation 02/03/15-03/10/15   Right Breast   Sarcoid    per pt , not sure   Schatzki's ring    Shortness of breath    Sleep apnea    Stroke [facility]) 2013   tia/ pt feels it was around 2008 0r 2009   Takotsubo syndrome    Tubular adenoma of colon    Unspecified transient cerebral ischemia    Unspecified vitamin D deficiency    Patient Active Problem List   Diagnosis Date Noted   Pain in joint of  right shoulder 02/10/2021   Arthritis of both acromioclavicular joints 01/02/2021   Trochanteric bursitis of right hip 10/03/2020   Tailbone injury 05/05/2020   UTI (urinary tract infection) 05/05/2020   Insomnia 05/05/2020   Leg cramps 02/16/2020   Dysuria 02/16/2020   Right hip impingement syndrome 02/01/2020   Right bicipital tenosynovitis 02/01/2020   Bronchiectasis without complication ([place]) 67/89/3810   Myofascial pain 12/21/2019   Estrogen deficiency 12/10/2019   Obesity (BMI 30-39.9) 12/10/2019   Bronchiectasis with (acute) exacerbation ([place]) 10/23/2019   Elevated LFTs 10/15/2019   Lower abdominal pain 09/01/2019   Neck pain 10/25/2017   Paresthesias in left hand 10/25/2017   Paresthesia of foot, bilateral 04/12/2017   Primary osteoarthritis of both hands 09/27/2016   Primary osteoarthritis of both knees 09/27/2016   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   Nasopharyngitis, 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/2016   S/P total knee replacement 02/13/2016   Chronic pain of both knees 12/06/2015   Globus pharyngeus 05/17/2015   Genetic testing 12/23/2014   Family history of breast cancer    Family history of colon cancer    Family history of pancreatic cancer    History of breast cancer 11/23/2014   Allergy to ACE inhibitors 09/27/2014   Prediabetes 06/23/2013   RLS (restless legs syndrome) 01/15/2013   Hx of Clostridium difficile infection 10/10/2012   Dyspnea on exertion 04/02/2012   NICM (nonischemic cardiomyopathy) ([place]) 03/07/2012   Post-menopausal 01/11/2012   Obstructive sleep apnea 04/04/2010   V-tach ([place]) 01/03/2009   ICD  [employer]  Single chamber 01/03/2009   PFO (patent foramen ovale) 09/29/2008   Vitamin D deficiency 08/17/2008   Hypothyroidism 11/18/2006   Hyperlipidemia 11/18/2006   Depression with anxiety 11/18/2006   Essential hypertension 11/18/2006   Mitral valve prolapse 11/18/2006   Cerebral aneurysm 11/18/2006   Allergic rhinitis 11/18/2006   GERD  11/18/2006   Diverticulosis of colon 11/18/2006   Fatty liver 11/18/2006   Fibromyalgia 11/18/2006   Past Surgical History:  Procedure Laterality Date   ABI  2006   normal   BREAST EXCISIONAL BIOPSY     BREAST LUMPECTOMY WITH RADIOACTIVE SEED LOCALIZATION Right 01/03/2015   Procedure: BREAST LUMPECTOMY WITH RADIOACTIVE SEED LOCALIZATION;  Surgeon: Autumn Messing III, MD;  Location: [place];  Service: General;  Laterality: Right;   CARDIAC DEFIBRILLATOR PLACEMENT  2006; 2012   BSX single chamber ICD   carotid dopplers  2006   neg   CEREBRAL ANEURYSM REPAIR  02/1999   COLONOSCOPY     HAMMER TOE SURGERY  11/15/2020   3rd digit bilateral feet    hospitalization  2004   GI bleed, PUD, diverticulosis (EGD,colonscopy)   hospitalization     PE, NSVT, s/p defib   KNEE ARTHROSCOPY     bilateral   LEFT HEART CATHETERIZATION WITH CORONARY  ANGIOGRAM N/A 02/22/2012   Procedure: LEFT HEART CATHETERIZATION WITH CORONARY ANGIOGRAM;  Surgeon: Burnell Blanks, MD;  Location: [facility] CATH LAB;  Service: Cardiovascular;  Laterality: N/A;   PARTIAL HYSTERECTOMY     Fibroids   TONGUE BIOPSY  12/12/2017   due to sore tongue and white patches/abnormal cells   TOTAL KNEE ARTHROPLASTY Left 02/13/2016   Procedure: TOTAL KNEE ARTHROPLASTY;  Surgeon: Vickey Huger, MD;  Location: [place];  Service: Orthopedics;  Laterality: Left;   UPPER GASTROINTESTINAL ENDOSCOPY      Family History  Problem Relation Age of Onset   Diabetes Mother    Alzheimer's disease Mother    Hypertension Mother    Obesity Mother    Other Brother        Thyroid problem 10/2016   Allergies Sister    Heart disease Sister    Heart disease Brother    Prostate cancer Brother 58       same brother as throat cancer   Throat cancer Brother        dx in his 42s; also a smoker   Heart disease Father    Cancer Maternal Grandmother 50       colon cancer or abdominal cancer   Lung cancer Maternal Grandfather 78   Breast cancer Maternal Aunt 55    Colon cancer Maternal Aunt 61       same sister as breast at 83   Lung cancer Maternal Uncle    Breast cancer Maternal Aunt        dx in her 30s   Pancreatic cancer Cousin 11       maternal first cousin   Breast cancer Cousin        paternal first cousin twice removed died in her 63s   Heart disease Son        Cardiac Arrest 07/2016   Stroke Neg Hx     Medications- reviewed and updated Current Outpatient Medications  Medication Sig Dispense Refill   albuterol (PROVENTIL) (2.5 MG/3ML) 0.083% nebulizer solution USE 1 VIAL IN NEBULIZER EVERY 6 HOURS 120 mL 11   albuterol (VENTOLIN HFA) 108 (90 Base) MCG/ACT inhaler Inhale 2 puffs into the lungs every 6 (six) hours as needed for wheezing or shortness of breath. 18 g 3   Ascorbic Acid (VITAMIN C) 1000 MG tablet Take 1,000 mg by mouth daily.     aspirin 81 MG tablet Take 81 mg by mouth daily.     baclofen (LIORESAL) 10 MG tablet TAKE 1 TABLET BY MOUTH AT BEDTIME AS NEEDED FOR MUSCLE SPASM 30 tablet 2   BLACK CURRANT SEED OIL PO Take 1 tablet by mouth daily.     calcium carbonate (OS-CAL - DOSED IN MG OF ELEMENTAL CALCIUM) 1250 (500 Ca) MG tablet Take 1 tablet by mouth.     carvedilol (COREG) 12.5 MG tablet Take 1 tablet (12.5 mg total) by mouth 2 (two) times daily. 180 tablet 3   cholecalciferol (VITAMIN D) 1000 UNITS tablet Take 5,000 Units by mouth daily.      diclofenac Sodium (VOLTAREN) 1 % GEL Apply 2-4 grams to affected joint 4 times daily as needed. 400 g 2   ELDERBERRY PO Take by mouth.     fexofenadine (ALLEGRA) 180 MG tablet Take 180 mg by mouth daily as needed for allergies or rhinitis.     FLUoxetine (PROZAC) 10 MG capsule Take 1 capsule (10 mg total) by mouth daily. 90 capsule 0   fluticasone (FLONASE) 50 MCG/ACT  nasal spray Place 2 sprays into both nostrils daily as needed for allergies or rhinitis. 48 g 3   furosemide (LASIX) 40 MG tablet Take 20 mg by mouth daily. 20-40mg      hydroxypropyl methylcellulose / hypromellose  (ISOPTO TEARS / GONIOVISC) 2.5 % ophthalmic solution Place 1 drop into both eyes daily.     latanoprost (XALATAN) 0.005 % ophthalmic solution Place 1 drop into both eyes at bedtime.     levothyroxine (SYNTHROID) 50 MCG tablet Take 1 tablet (50 mcg total) by mouth daily. 90 tablet 3   magnesium oxide (MAG-OX) 400 MG tablet Take 1 tablet (400 mg total) by mouth in the morning, at noon, and at bedtime. 270 tablet 3   mometasone (ELOCON) 0.1 % lotion Apply 1 application topically daily as needed (rash).      montelukast (SINGULAIR) 10 MG tablet TAKE 1 TABLET AT BEDTIME 90 tablet 3   Multiple Vitamins-Minerals (MULTIVITAMIN PO) Take 1 tablet by mouth daily.     pantoprazole (PROTONIX) 40 MG tablet Take 1 tablet (40 mg total) by mouth daily. 30 tablet 11   polyethylene glycol powder (GLYCOLAX/MIRALAX) powder Take 17 grams by mouth daily 1080 g 0   potassium chloride (KLOR-CON) 10 MEQ tablet TAKE 1/2 TABLET EVERY DAY 45 tablet 3   Respiratory Therapy Supplies (FLUTTER) DEVI 1 Device by Does not apply route as needed. 1 each 0   simvastatin (ZOCOR) 40 MG tablet TAKE 1/2 TABLET EVERY DAY. 45 tablet 3   sodium chloride HYPERTONIC 3 % nebulizer solution Take by nebulization 2 (two) times daily as needed for cough. 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 750 mL 12   traMADol (ULTRAM) 50 MG tablet Take 1 tablet (50 mg total) by mouth 2 (two) times daily. 180 tablet 1   traZODone (DESYREL) 50 MG tablet Take 0.5-1 tablets (25-50 mg total) by mouth at bedtime as needed for sleep. 90 tablet 0   zinc gluconate 50 MG tablet Take 50 mg by mouth daily.     zolpidem (AMBIEN) 5 MG tablet Take 1 tablet (5 mg total) by mouth at bedtime as needed for sleep. 90 tablet 0   nitroGLYCERIN (NITROSTAT) 0.4 MG SL tablet Place 1 tablet (0.4 mg total) under the tongue every 5 (five) minutes as needed. 25 tablet 3   No current facility-administered medications for this visit.    Allergies-reviewed and updated Allergies  Allergen Reactions   Ace  Inhibitors Swelling    Angioedema; makes tongue "break out"    Amitriptyline Hcl Other (See Comments)     makes her too sleepy!   Atorvastatin Other (See Comments)    SEVERE MYALGIA   Cymbalta [Duloxetine Hcl] Nausea Only and Other (See Comments)    Sleepiness/ sick   Dilantin [Phenytoin Sodium Extended] Rash    Severe rash   Paroxetine Nausea Only    Rapid heartbeat   Ramipril Other (See Comments)    TONGUE ULCERS    Rosuvastatin Other (See Comments)    SEVERE MYALGIA   Carbamazepine Rash   Codeine Itching   Iodine-131 Other (See Comments)    Swelling at IV site only, no swelling or SOB around mouth.!   Phenytoin Sodium Extended Rash   Wellbutrin [Bupropion] Palpitations    Social History   Socioeconomic History   Marital status: Divorced    Spouse name: Not on file   Number of children: 2   Years of education: 14   Highest education level: Not on file  Occupational History   Occupation: DISABLED  Tobacco Use   Smoking status: Former    Packs/day: 1.00    Years: 15.00    Pack years: 15.00    Types: Cigarettes    Quit date: 08/20/1989    Years since quitting: 31.5   Smokeless tobacco: Never   Tobacco comments:    Started at 28; less than 1 PPD  Vaping Use   Vaping Use: Never used  Substance and Sexual Activity   Alcohol use: Yes    Alcohol/week: 0.0 standard drinks    Comment: rare   Drug use: No   Sexual activity: Not Currently  Other Topics Concern   Not on file  Social History Narrative   Lives alone   Caffeine JOA:CZYS   Retired from [employer]   2 children -- 5 grandbabies   Live alone   Social Determinants of Radio broadcast assistant Strain: Low Risk    Difficulty of Paying Living Expenses: Not hard at all  Food Insecurity: No Food Insecurity   Worried About Charity fundraiser in the Last Year: Never true   Arboriculturist in the Last Year: Never true  Transportation Needs: No Transportation Needs   Lack of Transportation (Medical): No    Lack of Transportation (Non-Medical): No  Physical Activity: Inactive   Days of Exercise per Week: 0 days   Minutes of Exercise per Session: 0 min  Stress: No Stress Concern Present   Feeling of Stress : Not at all  Social Connections: Moderately Isolated   Frequency of Communication with Friends and Family: More than three times a week   Frequency of Social Gatherings with Friends and Family: More than three times a week   Attends Religious Services: 1 to 4 times per year   Active Member of [employer] or Organizations: No   Attends Music therapist: Never   Marital Status: Divorced         Objective:  Physical Exam: BP 105/68   Pulse 65   Temp 98 F (36.7 C) (Temporal)   Ht 5\' 6"  (1.676 m)   Wt 220 lb 6.4 oz (100 kg)   LMP  (LMP Unknown)   SpO2 97%   BMI 35.57 kg/m   Gen: NAD, resting comfortably CV: Regular rate and rhythm with no murmurs appreciated Pulm: Normal work of breathing, clear to auscultation bilaterally with no crackles, wheezes, or rhonchi GI: Normal bowel sounds present. Soft, Nontender, Nondistended. MSK: No edema, cyanosis, or clubbing noted Skin: Warm, dry Neuro: Grossly normal, moves all extremities Psych: Normal affect and thought content  I,Harris Phan,acting as a scribe for Dimas Chyle, MD.,have documented all relevant documentation on the behalf of Dimas Chyle, MD,as directed by  Dimas Chyle, MD while in the presence of Dimas Chyle, MD.  I, Dimas Chyle, MD, have reviewed all documentation for this visit. The documentation on 03/07/21 for the exam, diagnosis, procedures, and orders are all accurate and complete.  A copy of this note will be forwarded to the requesting physician.   Algis Greenhouse. Jerline Pain, MD 03/07/2021 12:09 PM

## 2021-03-07 NOTE — Patient Instructions (Signed)
It was very nice to see you today!  We will complete your surgical clearance form today.  Please let us know if you need anything else.  Take care, Dr Jerline Pain  PLEASE NOTE:  If you had any lab tests please let us know if you have not heard back within a few days. You may see your results on mychart before we have a chance to review them but we will give you a call once they are reviewed by [place]. If we ordered any referrals today, please let us know if you have not heard from their office within the next week.   Please try these tips to maintain a healthy lifestyle:  Eat at least 3 REAL meals and 1-2 snacks per day.  Aim for no more than 5 hours between eating.  If you eat breakfast, please do so within one hour of getting up.   Each meal should contain half fruits/vegetables, one quarter protein, and one quarter carbs (no bigger than a computer mouse)  Cut down on sweet beverages. This includes juice, soda, and sweet tea.   Drink at least 1 glass of water with each meal and aim for at least 8 glasses per day  Exercise at least 150 minutes every week.

## 2021-03-07 NOTE — Assessment & Plan Note (Addendum)
Will be undergoing right-sided TKA soon.  Uses Tylenol as needed.

## 2021-03-07 NOTE — Assessment & Plan Note (Signed)
Stable.  Normal lung exam today.  Has been cleared by pulmonology for surgery.

## 2021-03-10 ENCOUNTER — Other Ambulatory Visit: Payer: Self-pay | Admitting: Physician Assistant

## 2021-03-20 ENCOUNTER — Encounter (INDEPENDENT_AMBULATORY_CARE_PROVIDER_SITE_OTHER): Payer: Self-pay | Admitting: Family Medicine

## 2021-03-20 ENCOUNTER — Other Ambulatory Visit: Payer: Self-pay | Admitting: Physician Assistant

## 2021-03-21 MED ORDER — ZOLPIDEM TARTRATE 5 MG PO TABS: 5.0000 mg | ORAL_TABLET | Freq: Every evening | ORAL | 0 refills | Status: DC | PRN

## 2021-03-21 NOTE — Telephone Encounter (Signed)
Pt last seen by Dr. Wallace.  

## 2021-03-23 ENCOUNTER — Encounter (INDEPENDENT_AMBULATORY_CARE_PROVIDER_SITE_OTHER): Payer: Self-pay | Admitting: Family Medicine

## 2021-03-23 ENCOUNTER — Ambulatory Visit (INDEPENDENT_AMBULATORY_CARE_PROVIDER_SITE_OTHER): Payer: Medicare Other | Admitting: Family Medicine

## 2021-03-23 ENCOUNTER — Other Ambulatory Visit: Payer: Self-pay

## 2021-03-23 ENCOUNTER — Ambulatory Visit
Admission: RE | Admit: 2021-03-23 | Discharge: 2021-03-23 | Disposition: A | Discharge status: Home / Self Care | Payer: Medicare Other | Source: Ambulatory Visit | Attending: Physician Assistant | Admitting: Physician Assistant

## 2021-03-23 VITALS — BP 131/72 | HR 67 | Temp 97.8°F | Ht 66.0 in | Wt 220.0 lb

## 2021-03-23 DIAGNOSIS — E038 Other specified hypothyroidism: Secondary | ICD-10-CM | POA: Diagnosis not present

## 2021-03-23 DIAGNOSIS — E8881 Metabolic syndrome: Secondary | ICD-10-CM

## 2021-03-23 DIAGNOSIS — Z6836 Body mass index (BMI) 36.0-36.9, adult: Secondary | ICD-10-CM

## 2021-03-23 DIAGNOSIS — Z1231 Encounter for screening mammogram for malignant neoplasm of breast: Secondary | ICD-10-CM

## 2021-03-23 DIAGNOSIS — R7401 Elevation of levels of liver transaminase levels: Secondary | ICD-10-CM | POA: Diagnosis not present

## 2021-03-23 DIAGNOSIS — E039 Hypothyroidism, unspecified: Secondary | ICD-10-CM

## 2021-03-23 MED ORDER — LEVOTHYROXINE SODIUM 50 MCG PO TABS
50.0000 ug | ORAL_TABLET | Freq: Every day | ORAL | 1 refills | Status: DC
Start: 2021-03-23 — End: 2021-09-15

## 2021-03-23 NOTE — Progress Notes (Signed)
Chief Complaint:   OBESITY [name] is here to discuss her progress with her obesity treatment plan along with follow-up of her obesity related diagnoses. See Medical Weight Management Flowsheet for complete bioelectrical impedance results.  Today's visit was #: 21 Starting weight: 223 lbs Starting date: 10/01/2019 Today's weight: 220 lbs Today's date: 03/23/2021 Weight change since last visit: +5 lbs Total lbs lost to date: 3 lbs Body mass index is 35.51 kg/m.  Total weight loss percentage to date: -1.35%  Interim History:  [name] is having TKA in September.  She says she went to a festival this week for 2 days and has increased lower extremity edema and had increased salt/calorie intake.  Nutrition Plan: practicing portion control and making smarter food choices, such as increasing vegetables and decreasing simple carbohydrates for 0% of the time. Activity:  Increased yard work.  Assessment/Plan:   1. Transaminitis CT from 11/18/19: Hepatobiliary: No focal liver abnormality is seen. No gallstones, gallbladder wall thickening, or biliary dilatation. We will continue to monitor symptoms as they relate to her weight loss journey.  Lab Results  Component Value Date   ALT 47 (H) 02/15/2021   AST 50 (H) 02/15/2021   ALKPHOS 115 02/15/2021   BILITOT 0.4 02/15/2021   2. Insulin resistance Not at goal. Goal is HgbA1c < 5.7, fasting insulin closer to 5.  Medication: None.    Plan:  She will continue to focus on protein-rich, low simple carbohydrate foods. We reviewed the importance of hydration, regular exercise for stress reduction, and restorative sleep.   Lab Results  Component Value Date   HGBA1C 5.6 02/15/2021   Lab Results  Component Value Date   INSULIN 60.3 (H) 02/15/2021   INSULIN 15.6 10/01/2019   3. Other specified hypothyroidism Course: Controlled. Medication: levothyroxine 50 mcg daily.   Plan: Patient was instructed not to take MVM or iron within 4 hours of  taking thyroid medications. We will continue to monitor alongside Endocrinology/PCP as it relates to her weight loss journey.   Lab Results  Component Value Date   TSH 1.150 02/15/2021   - Refill levothyroxine (SYNTHROID) 50 MCG tablet; Take 1 tablet (50 mcg total) by mouth daily.  Dispense: 90 tablet; Refill: 1  4. Obesity, current BMI 35.5  Course: [name] is currently in the action stage of change. As such, her goal is to continue with weight loss efforts.   Nutrition goals: She has agreed to practicing portion control and making smarter food choices, such as increasing vegetables and decreasing simple carbohydrates.   Exercise goals:  As is.  Behavioral modification strategies: increasing lean protein intake, decreasing simple carbohydrates, increasing vegetables, and increasing water intake.  [name] has agreed to follow-up with our clinic in 4 weeks. She was informed of the importance of frequent follow-up visits to maximize her success with intensive lifestyle modifications for her multiple health conditions.   Objective:   Blood pressure 131/72, pulse 67, temperature 97.8 F (36.6 C), temperature source Oral, height '5\' 6"'$  (1.676 m), weight 220 lb (99.8 kg), SpO2 95 %. Body mass index is 35.51 kg/m.  General: Cooperative, alert, well developed, in no acute distress. HEENT: Conjunctivae and lids unremarkable. Cardiovascular: Regular rhythm.  Lungs: Normal work of breathing. Neurologic: No focal deficits.   Lab Results  Component Value Date   CREATININE 0.80 02/15/2021   BUN 13 02/15/2021   NA 143 02/15/2021   K 4.4 02/15/2021   CL 104 02/15/2021   CO2 25 02/15/2021  Lab Results  Component Value Date   ALT 47 (H) 02/15/2021   AST 50 (H) 02/15/2021   ALKPHOS 115 02/15/2021   BILITOT 0.4 02/15/2021   Lab Results  Component Value Date   HGBA1C 5.6 02/15/2021   HGBA1C 5.3 12/07/2019   HGBA1C 5.7 (H) 10/01/2019   HGBA1C 5.4 12/04/2018   HGBA1C 5.6 11/27/2017    Lab Results  Component Value Date   INSULIN 60.3 (H) 02/15/2021   INSULIN 15.6 10/01/2019   Lab Results  Component Value Date   TSH 1.150 02/15/2021   Lab Results  Component Value Date   CHOL 178 12/20/2020   HDL 59.30 12/20/2020   LDLCALC 100 (H) 12/20/2020   LDLDIRECT 141.8 07/04/2007   TRIG 92.0 12/20/2020   CHOLHDL 3 12/20/2020   Lab Results  Component Value Date   VD25OH 56.9 02/15/2021   VD25OH 61.53 12/07/2019   VD25OH 50.9 10/01/2019   Lab Results  Component Value Date   WBC 4.5 02/15/2021   HGB 13.5 02/15/2021   HCT 41.9 02/15/2021   MCV 94 02/15/2021   PLT 212 02/15/2021   Lab Results  Component Value Date   IRON 89 10/01/2019   TIBC 335 10/01/2019   FERRITIN 178 (H) 10/01/2019   Obesity Behavioral Intervention:   Approximately 15 minutes were spent on the discussion below.  ASK: We discussed the diagnosis of obesity with [name] today and [name] agreed to give [place] permission to discuss obesity behavioral modification therapy today.  ASSESS: Janon has the diagnosis of obesity and her BMI today is 35.5. [name] is in the action stage of change.   ADVISE: [name] was educated on the multiple health risks of obesity as well as the benefit of weight loss to improve her health. She was advised of the need for long term treatment and the importance of lifestyle modifications to improve her current health and to decrease her risk of future health problems.  AGREE: Multiple dietary modification options and treatment options were discussed and [name] agreed to follow the recommendations documented in the above note.  ARRANGE: [name] was educated on the importance of frequent visits to treat obesity as outlined per CMS and USPSTF guidelines and agreed to schedule her next follow up appointment today.  Attestation Statements:   Reviewed by clinician on day of visit: allergies, medications, problem list, medical history, surgical history, family history, social  history, and previous encounter notes.  I, Water quality scientist, CMA, am acting as transcriptionist for Briscoe Deutscher, DO  I have reviewed the above documentation for accuracy and completeness, and I agree with the above. Briscoe Deutscher, DO

## 2021-03-24 NOTE — Progress Notes (Signed)
Remote ICD transmission.   

## 2021-03-29 ENCOUNTER — Other Ambulatory Visit: Payer: Self-pay

## 2021-03-29 ENCOUNTER — Encounter
Payer: Medicare Other | Attending: Physical Medicine and Rehabilitation | Admitting: Physical Medicine and Rehabilitation

## 2021-03-29 ENCOUNTER — Encounter: Payer: Self-pay | Admitting: Physical Medicine and Rehabilitation

## 2021-03-29 VITALS — HR 65 | Temp 97.9°F | Ht 66.5 in | Wt 221.0 lb

## 2021-03-29 DIAGNOSIS — M19012 Primary osteoarthritis, left shoulder: Secondary | ICD-10-CM | POA: Diagnosis present

## 2021-03-29 DIAGNOSIS — M19011 Primary osteoarthritis, right shoulder: Secondary | ICD-10-CM | POA: Insufficient documentation

## 2021-03-29 DIAGNOSIS — M7918 Myalgia, other site: Secondary | ICD-10-CM | POA: Insufficient documentation

## 2021-03-29 MED ORDER — TRAMADOL HCL 50 MG PO TABS: 50.0000 mg | ORAL_TABLET | Freq: Two times a day (BID) | ORAL | 1 refills | Status: DC

## 2021-03-29 NOTE — Progress Notes (Signed)
Patient is a 70 yr old R handed female with hx of  Bronchiesctasis, CHF, prediabetes- with A1c of 5.6- as of 02/15/21- - not on blood thinners, has a defibrillator, GERD, and fibromyalgia issues- dx'd 10 years ago.  Here for f/u on chronic pain and trigger oint injections. Last got subacromial shoulder injections B/L on 01/02/21.   Here for f/u and B/L shoulder pain and myofascial pain.   Asked for R knee injection- surgery scheduled 05/08/21- so cannot get injection per surgeon.  Got clearance done and ready for pre-op.   Feels like needs shoulder injections as well as some trigger point injections.   R shoulder is still worse.  Steroid injections wore off ~ 1 month ago.   Bought self new mattress- Serta mattress- so hard.  Trying to get used to it. Was $160-  Also got new pillow- at [employer]- pillow "great".  Also bought a new pillow at [employer]-   Just traded in the tempurpedic.      Plan: steroid injection was performed at B/L subacromial steroid injection using 1% plain Lidocaine and '40mg'$  /1cc of Kenalog. This was well tolerated.  Cleaned with betadine x3 and allowed to dry- then alcohol then injected using 27 gauge 1.5 inch needle- no bleeding or complications.    F/U in 3 months for steroid injections of shoulders/subacromial steroid injections  Lidocaine will kick in 15 minutes- and wear off tonight- the steroid will kick in tomorrow within 24 hours and take up to 72 hours to fully kick in.  2. Patient here for trigger point injections for   Consent done and on chart.  Cleaned areas with alcohol and injected using a 27 gauge 1.5 inch needle  Injected 1.5cc Using 1% Lidocaine with no EPI  Upper traps B/L  Levators B/L  Posterior scalenes Middle scalenes Splenius Capitus Pectoralis Major Rhomboids Infraspinatus Teres Major/minor Thoracic paraspinals Lumbar paraspinals Other injections-    Patient's level of pain prior was 7-8/10- 5-6/10 in neck because knees  hurting so bad Current level of pain after injections is- no change so far  There was no bleeding or complications.  Patient was advised to drink a lot of water on day after injections to flush system Will have increased soreness for 12-48 hours after injections.  Can use Lidocaine patches the day AFTER injections Can use theracane on day of injections in places didn't inject Can use heating pad 4-6 hours AFTER injections  3. Last Rx for tramadol was 10/03/20- will refill  Tramadol 3 months supply 180 and 1 refill-   4. F/U in 3 months. For injections

## 2021-03-29 NOTE — Patient Instructions (Signed)
steroid injection was performed at B/L subacromial steroid injection using 1% plain Lidocaine and '40mg'$  /1cc of Kenalog. This was well tolerated.  Cleaned with betadine x3 and allowed to dry- then alcohol then injected using 27 gauge 1.5 inch needle- no bleeding or complications.    F/U in 3 months for steroid injections of shoulders/subacromial steroid injections  Lidocaine will kick in 15 minutes- and wear off tonight- the steroid will kick in tomorrow within 24 hours and take up to 72 hours to fully kick in.  2. Patient here for trigger point injections for   Consent done and on chart.  Cleaned areas with alcohol and injected using a 27 gauge 1.5 inch needle  Injected 1.5cc Using 1% Lidocaine with no EPI  Upper traps B/L  Levators B/L  Posterior scalenes Middle scalenes Splenius Capitus Pectoralis Major Rhomboids Infraspinatus Teres Major/minor Thoracic paraspinals Lumbar paraspinals Other injections-    Patient's level of pain prior was 7-8/10- 5-6/10 in neck because knees hurting so bad Current level of pain after injections is- no change so far  There was no bleeding or complications.  Patient was advised to drink a lot of water on day after injections to flush system Will have increased soreness for 12-48 hours after injections.  Can use Lidocaine patches the day AFTER injections Can use theracane on day of injections in places didn't inject Can use heating pad 4-6 hours AFTER injections  3. Last Rx for tramadol was 10/03/20- will refill  Tramadol 3 months supply 180 and 1 refill-   4. F/U in 3 months. For injections

## 2021-04-04 ENCOUNTER — Other Ambulatory Visit: Payer: Self-pay

## 2021-04-04 ENCOUNTER — Ambulatory Visit (INDEPENDENT_AMBULATORY_CARE_PROVIDER_SITE_OTHER): Payer: Medicare Other | Admitting: Pulmonary Disease

## 2021-04-04 ENCOUNTER — Encounter: Payer: Self-pay | Admitting: Pulmonary Disease

## 2021-04-04 VITALS — BP 118/68 | HR 62 | Ht 66.5 in | Wt 218.6 lb

## 2021-04-04 DIAGNOSIS — Z01811 Encounter for preprocedural respiratory examination: Secondary | ICD-10-CM | POA: Diagnosis not present

## 2021-04-04 DIAGNOSIS — G4733 Obstructive sleep apnea (adult) (pediatric): Secondary | ICD-10-CM

## 2021-04-04 DIAGNOSIS — J479 Bronchiectasis, uncomplicated: Secondary | ICD-10-CM | POA: Diagnosis not present

## 2021-04-04 DIAGNOSIS — J301 Allergic rhinitis due to pollen: Secondary | ICD-10-CM | POA: Diagnosis not present

## 2021-04-04 MED ORDER — ALBUTEROL SULFATE HFA 108 (90 BASE) MCG/ACT IN AERS: 2.0000 | INHALATION_SPRAY | Freq: Four times a day (QID) | RESPIRATORY_TRACT | 3 refills | Status: DC | PRN

## 2021-04-04 MED ORDER — MONTELUKAST SODIUM 10 MG PO TABS: 10.0000 mg | ORAL_TABLET | Freq: Every day | ORAL | 3 refills | Status: DC

## 2021-04-04 NOTE — Progress Notes (Signed)
[facility]  Chief Complaint  Patient presents with   Follow-up    F/U for OSA. States she feels like the cpap machine is blowing out too much air.      Constitutional:  BP 118/68   Pulse 62   Ht 5' 6.5" (1.689 m)   Wt 218 lb 9.6 oz (99.2 kg)   LMP  (LMP Unknown)   SpO2 100% Comment: on RA  BMI 34.75 kg/m   Past Medical History:  Vit D deficiency, Takotsubo CM, Schatzki's ring, PUD, VT s/p AICD, PE, NASH, ASD, Hypothyroidism, HTN, HLD, HH, GERD, Fibromyalgia, Diverticulosis, Depression, C diff, Cerebral aneurysm, Breast cancer 2016, OA  Past Surgical History:  Her  has a past surgical history that includes Partial hysterectomy; Cerebral aneurysm repair (02/1999); Knee arthroscopy; Cardiac defibrillator placement (2006; 2012); carotid dopplers (2006); hospitalization (2004); hospitalization; ABI (2006); left heart catheterization with coronary angiogram (N/A, 02/22/2012); Breast lumpectomy with radioactive seed localization (Right, 01/03/2015); Total knee arthroplasty (Left, 02/13/2016); Breast excisional biopsy; Tongue Biopsy (12/12/2017); Colonoscopy; Upper gastrointestinal endoscopy; and Hammer toe surgery (11/15/2020).  Brief Summary:  [name] is a 70 y.o. female former smoker with bronchiectasis and obstructive sleep apnea.      Subjective:   She has lost about 8 lbs since last visit.  Feels CPAP pressure too high now.  Has some cough with small amount of yellow sputum.  No fever, wheeze, hemoptysis, or chest pain.  Has knee surgery scheduled in September.  Physical Exam:   Appearance - well kempt   ENMT - no sinus tenderness, no oral exudate, no LAN, Mallampati 3 airway, no stridor  Respiratory - equal breath sounds bilaterally, no wheezing or rales  CV - s1s2 regular rate and rhythm, no murmurs  Ext - no clubbing, no edema  Skin - no rashes  Psych - normal mood and affect   Pulmonary testing:  PFT  04/17/12 >> FEV1 2.60 (110%), FEV1% 85, TLC 4.05 (77%), DLCO 44% PFT 01/17/17 >> FEV1 2.38 (111%), FEV1% 90, TLC 3.85 (71%), DLCO 58%, no BD RAST 05/30/17 >> dust mites PFT 10/16/18 >> FEV1 2.27 (111%), FEV1% 90, TLC 4.00 (74%), DLCO 59%  Serology:  04/02/12 >> ACE 1 10/25/15 >> ESR 40, ACE 32 05/30/17 >> IgA, IgG, IgM normal 10/16/18 >> HP panel negative, ACE 25  Chest Imaging:  CT chest 04/10/12 >> b/l lower lobe cylindrical BTX and some in upper lobes CT angio chest 10/26/15 >> patchy GGO in periphery of lungs b/l, basilar BTX, no PE; no significant change compared to 2013 HRCT chest 10/08/18 >> atherosclerosis, basilar predominant cylindrical BTX, mild centrilobular/paraseptal emphysema, air trapping CT angio chest 03/24/19 >> Chronic lung changes with peribronchial thickening, bibasilar atelectasis and chronic interstitial disease/peripheral fibrosis at the lung bases  Sleep Tests:  PSG 11/29/08 >> AHI 9 HST 11/10/15 >> AHI 7.1, SaO2 low 75% CPAP 12/11/20 to 01/09/21 >> used on 25 of 30 nights with average 4 hrs 49 min.  Average AHI 2.2 with CPAP 6 cm H2O.  Cardiac Tests:  Echo 05/13/17 >> mild LVH, EF 55 to 60%, grade 1 DD, mild TR  Social History:  She  reports that she quit smoking about 31 years ago. Her smoking use included cigarettes. She has a 15.00 pack-year smoking history. She has never used smokeless tobacco. She reports current alcohol use. She reports that she does not use drugs.  Family History:  Her family history includes Allergies in her sister; Alzheimer's disease  in her mother; Breast cancer in her cousin and maternal aunt; Breast cancer (age of onset: 43) in her maternal aunt; Cancer (age of onset: 37) in her maternal grandmother; Colon cancer (age of onset: 26) in her maternal aunt; Diabetes in her mother; Heart disease in her brother, father, sister, and son; Hypertension in her mother; Lung cancer in her maternal uncle; Lung cancer (age of onset: 32) in her maternal  grandfather; Obesity in her mother; Other in her brother; Pancreatic cancer (age of onset: 40) in her cousin; Prostate cancer (age of onset: 80) in her brother; Throat cancer in her brother.     Assessment/Plan:   Bronchiectasis. - advised her to try using mucinex, flutter valve and albuterol on more regular basis - continue singulair at night - she has vibrating bed for chest percussion - discussed symptoms to monitor for that would indicate an exacerbation and need for antibiotics   Upper airway cough syndrome with allergic rhinitis. - continue singulair, allegra, flonase, Navage nasal irrigation   Obstructive sleep apnea. - she is compliant with CPAP and reports benefit - she uses [employer] for her DME - likely has improved with weight loss - will change CPAP to 5 cm H2O; after she recovers from knee surgery will assess whether she still needs CPAP therapy if she is able to maintain her weight  Non-ischemic cardiomyopathy. - followed by Dr. Irish Lack and Dr. Caryl Comes with [facility]  Knee pain. - she is being followed by Dr. Ronnie Derby with orthopedics - there are no pulmonary contraindications to her having knee surgery  Time Spent Involved in Patient Care on Day of Examination:  31 minutes  Follow up:   Patient Instructions  Will have your CPAP changed to 5 cm water pressure  Follow up in 4 months  Medication List:   Allergies as of 04/04/2021       Reactions   Ace Inhibitors Swelling   Angioedema; makes tongue "break out"   Amitriptyline Hcl Other (See Comments)    makes her too sleepy!   Atorvastatin Other (See Comments)   SEVERE MYALGIA   Cymbalta [duloxetine Hcl] Nausea Only, Other (See Comments)   Sleepiness/ sick   Dilantin [phenytoin Sodium Extended] Rash   Severe rash   Paroxetine Nausea Only   Rapid heartbeat   Ramipril Other (See Comments)   TONGUE ULCERS   Rosuvastatin Other (See Comments)   SEVERE MYALGIA   Carbamazepine Rash   Codeine Itching    Iodine-131 Other (See Comments)   Swelling at IV site only, no swelling or SOB around mouth.!   Phenytoin Sodium Extended Rash   Wellbutrin [bupropion] Palpitations        Medication List        Accurate as of April 04, 2021 12:14 PM. If you have any questions, ask your nurse or doctor.          albuterol (2.5 MG/3ML) 0.083% nebulizer solution Commonly known as: PROVENTIL USE 1 VIAL IN NEBULIZER EVERY 6 HOURS   albuterol 108 (90 Base) MCG/ACT inhaler Commonly known as: Ventolin HFA Inhale 2 puffs into the lungs every 6 (six) hours as needed for wheezing or shortness of breath.   aspirin 81 MG tablet Take 81 mg by mouth daily.   baclofen 10 MG tablet Commonly known as: LIORESAL TAKE 1 TABLET BY MOUTH AT BEDTIME AS NEEDED FOR MUSCLE SPASM   BLACK CURRANT SEED OIL PO Take 1 tablet by mouth daily.   calcium carbonate 1250 (500 Ca) MG  tablet Commonly known as: OS-CAL - dosed in mg of elemental calcium Take 1 tablet by mouth.   carvedilol 12.5 MG tablet Commonly known as: COREG Take 1 tablet (12.5 mg total) by mouth 2 (two) times daily.   cholecalciferol 1000 units tablet Commonly known as: VITAMIN D Take 5,000 Units by mouth daily.   diclofenac Sodium 1 % Gel Commonly known as: VOLTAREN Apply 2-4 grams to affected joint 4 times daily as needed.   ELDERBERRY PO Take by mouth.   fexofenadine 180 MG tablet Commonly known as: ALLEGRA Take 180 mg by mouth daily as needed for allergies or rhinitis.   FLUoxetine 10 MG capsule Commonly known as: PROzac Take 1 capsule (10 mg total) by mouth daily.   fluticasone 50 MCG/ACT nasal spray Commonly known as: FLONASE Place 2 sprays into both nostrils daily as needed for allergies or rhinitis.   Flutter Devi 1 Device by Does not apply route as needed.   furosemide 40 MG tablet Commonly known as: LASIX Take 20 mg by mouth daily. 20-65m   hydroxypropyl methylcellulose / hypromellose 2.5 % ophthalmic  solution Commonly known as: ISOPTO TEARS / GONIOVISC Place 1 drop into both eyes daily.   latanoprost 0.005 % ophthalmic solution Commonly known as: XALATAN Place 1 drop into both eyes at bedtime.   levothyroxine 50 MCG tablet Commonly known as: SYNTHROID Take 1 tablet (50 mcg total) by mouth daily.   magnesium oxide 400 MG tablet Commonly known as: MAG-OX Take 1 tablet (400 mg total) by mouth in the morning, at noon, and at bedtime.   mometasone 0.1 % lotion Commonly known as: ELOCON Apply 1 application topically daily as needed (rash).   montelukast 10 MG tablet Commonly known as: SINGULAIR Take 1 tablet (10 mg total) by mouth at bedtime.   MULTIVITAMIN PO Take 1 tablet by mouth daily.   nitroGLYCERIN 0.4 MG SL tablet Commonly known as: NITROSTAT Place 1 tablet (0.4 mg total) under the tongue every 5 (five) minutes as needed.   pantoprazole 40 MG tablet Commonly known as: PROTONIX Take 1 tablet (40 mg total) by mouth daily.   polyethylene glycol powder 17 GM/SCOOP powder Commonly known as: GLYCOLAX/MIRALAX Take 17 grams by mouth daily   potassium chloride 10 MEQ tablet Commonly known as: KLOR-CON TAKE 1/2 TABLET EVERY DAY   simvastatin 40 MG tablet Commonly known as: ZOCOR TAKE 1/2 TABLET EVERY DAY.   sodium chloride HYPERTONIC 3 % nebulizer solution Take by nebulization 2 (two) times daily as needed for cough. 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   traMADol 50 MG tablet Commonly known as: ULTRAM Take 1 tablet (50 mg total) by mouth 2 (two) times daily.   traZODone 50 MG tablet Commonly known as: DESYREL Take 0.5-1 tablets (25-50 mg total) by mouth at bedtime as needed for sleep.   vitamin C 1000 MG tablet Take 1,000 mg by mouth daily.   zinc gluconate 50 MG tablet Take 50 mg by mouth daily.   zolpidem 5 MG tablet Commonly known as: AMBIEN Take 1 tablet (5 mg total) by mouth at bedtime as needed for sleep.        Signature:  VChesley Mires MD LMoraviaPager - [phone]/16/2022, 12:14 PM

## 2021-04-04 NOTE — Patient Instructions (Signed)
Will have your CPAP changed to 5 cm water pressure  Follow up in 4 months

## 2021-04-06 ENCOUNTER — Encounter: Payer: Self-pay | Admitting: Family Medicine

## 2021-04-06 NOTE — Telephone Encounter (Signed)
Please schedule patient, patient needs to be seen

## 2021-04-10 ENCOUNTER — Ambulatory Visit (INDEPENDENT_AMBULATORY_CARE_PROVIDER_SITE_OTHER): Payer: Medicare Other | Admitting: Physician Assistant

## 2021-04-10 ENCOUNTER — Other Ambulatory Visit: Payer: Self-pay | Admitting: Orthopedic Surgery

## 2021-04-10 ENCOUNTER — Other Ambulatory Visit: Payer: Self-pay

## 2021-04-10 ENCOUNTER — Encounter: Payer: Self-pay | Admitting: Physician Assistant

## 2021-04-10 VITALS — BP 122/70 | HR 59 | Temp 97.3°F | Ht 66.5 in | Wt 219.0 lb

## 2021-04-10 DIAGNOSIS — R252 Cramp and spasm: Secondary | ICD-10-CM | POA: Diagnosis not present

## 2021-04-10 DIAGNOSIS — R350 Frequency of micturition: Secondary | ICD-10-CM | POA: Diagnosis not present

## 2021-04-10 LAB — CBC WITH DIFFERENTIAL/PLATELET
Basophils Absolute: 0 10*3/uL (ref 0.0–0.1)
Basophils Relative: 0.7 % (ref 0.0–3.0)
Eosinophils Absolute: 0.1 10*3/uL (ref 0.0–0.7)
Eosinophils Relative: 2.2 % (ref 0.0–5.0)
HCT: 38.2 % (ref 36.0–46.0)
Hemoglobin: 12.3 g/dL (ref 12.0–15.0)
Lymphocytes Relative: 18.1 % (ref 12.0–46.0)
Lymphs Abs: 1 10*3/uL (ref 0.7–4.0)
MCHC: 32.3 g/dL (ref 30.0–36.0)
MCV: 92.6 fl (ref 78.0–100.0)
Monocytes Absolute: 0.5 10*3/uL (ref 0.1–1.0)
Monocytes Relative: 9.2 % (ref 3.0–12.0)
Neutro Abs: 3.9 10*3/uL (ref 1.4–7.7)
Neutrophils Relative %: 69.8 % (ref 43.0–77.0)
Platelets: 191 10*3/uL (ref 150.0–400.0)
RBC: 4.12 Mil/uL (ref 3.87–5.11)
RDW: 12.5 % (ref 11.5–15.5)
WBC: 5.6 10*3/uL (ref 4.0–10.5)

## 2021-04-10 LAB — COMPREHENSIVE METABOLIC PANEL
ALT: 40 U/L — ABNORMAL HIGH (ref 0–35)
AST: 42 U/L — ABNORMAL HIGH (ref 0–37)
Albumin: 3.9 g/dL (ref 3.5–5.2)
Alkaline Phosphatase: 100 U/L (ref 39–117)
BUN: 23 mg/dL (ref 6–23)
CO2: 27 mEq/L (ref 19–32)
Calcium: 9.8 mg/dL (ref 8.4–10.5)
Chloride: 106 mEq/L (ref 96–112)
Creatinine, Ser: 0.95 mg/dL (ref 0.40–1.20)
GFR: 59.85 mL/min — ABNORMAL LOW (ref >60.00)
Glucose, Bld: 88 mg/dL (ref 70–99)
Potassium: 4.3 mEq/L (ref 3.5–5.1)
Sodium: 139 mEq/L (ref 135–145)
Total Bilirubin: 0.4 mg/dL (ref 0.2–1.2)
Total Protein: 7 g/dL (ref 6.0–8.3)

## 2021-04-10 LAB — POCT URINALYSIS DIPSTICK
Bilirubin, UA: NEGATIVE
Blood, UA: NEGATIVE
Glucose, UA: NEGATIVE
Ketones, UA: NEGATIVE
Leukocytes, UA: NEGATIVE
Nitrite, UA: NEGATIVE
Protein, UA: NEGATIVE
Spec Grav, UA: >=1.03 — AB (ref 1.010–1.025)
Urobilinogen, UA: 0.2 E.U./dL
pH, UA: 5.5 (ref 5.0–8.0)

## 2021-04-10 LAB — MAGNESIUM: Magnesium: 2.2 mg/dL (ref 1.5–2.5)

## 2021-04-10 LAB — TSH: TSH: 1.55 u[IU]/mL (ref 0.35–5.50)

## 2021-04-10 LAB — CK: Total CK: 594 U/L — ABNORMAL HIGH (ref 7–177)

## 2021-04-10 NOTE — Patient Instructions (Signed)
It was great to see you!  Update your blood work today for further evaluation Consider follow-up with Dr. Irish Lack to clarify lasix use We will send your urine off for a culture and be in touch with those results  If any new or worsening symptoms in the meantime, let us know  Take care,  Inda Coke PA-C

## 2021-04-10 NOTE — Progress Notes (Signed)
[name] is a 70 y.o. female here for a new problem.  I acted as a Education administrator for [employer], PA-C [employer], LPN   History of Present Illness:   Chief Complaint  Patient presents with   Leg cramps   Urinary Frequency     HPI  Leg cramps Pt c/o bilateral leg cramps on 8/18.  She states that she was sitting on the toilet and she started having bilateral leg cramps and for a minute was unable to stand up to get herself off of the toilet.  After this event she took mustard and zinc, drank water.  She was unsure if maybe she had been dehydrated because earlier that day she had urinary frequency and had taken a Lasix tablet.  She does not take Lasix consistently even though this is listed on her medication list as a daily medication.  She denies calf pain, personal history of blood clots, numbness or tingling in feet.  She does take Zocor 20 mg daily, zinc, potassium, magnesium.  Urinary frequency Pt c/o urinary frequency x 2-3 days last week. Pt drank cranberry juice and felt better. Denies fever or chills.  She states that she is prone to UTIs and typically her symptoms are urinary frequency only.  Denies any unusual back pain.  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   Bronchiectasis ([place])    Cerebral aneurysm, nonruptured    had a clip put in   CHF (congestive heart failure) (HCC)    Clostridium difficile infection    Depressive disorder, not elsewhere classified    Diverticulosis of colon (without mention of hemorrhage)    Esophageal reflux    Fatty liver    Fibromyalgia    Gastritis    GERD (gastroesophageal reflux disease)    GI bleed 2004   Glaucoma    Hiatal hernia    Hyperlipidemia    Hypertension    Hypothyroidism    Internal hemorrhoids    Joint pain    Obstructive sleep apnea  (adult) (pediatric)    Osteoarthritis    Ostium secundum type atrial septal defect    Other chronic nonalcoholic liver disease    Other pulmonary embolism and infarction    Palpitations    Paroxysmal ventricular tachycardia (HCC)    Personal history of radiation therapy    Pneumonia    history   Presence of permanent cardiac pacemaker    PUD (peptic ulcer disease)    Radiation 02/03/15-03/10/15   Right Breast   Sarcoid    per pt , not sure   Schatzki's ring    Shortness of breath    Sleep apnea    Stroke [facility]) 2013   tia/ pt feels it was around 2008 0r 2009   Takotsubo syndrome    Tubular adenoma of colon    Unspecified transient cerebral ischemia    Unspecified vitamin D deficiency      Social History   Tobacco Use   Smoking status: Former    Packs/day: 1.00    Years: 15.00    Pack years: 15.00    Types: Cigarettes    Quit date: 08/20/1989    Years since quitting: 31.6   Smokeless tobacco: Never   Tobacco comments:    Started at 89; less than 1 PPD  Vaping Use   Vaping Use: Never used  Substance Use Topics   Alcohol use: Yes    Alcohol/week: 0.0 standard drinks    Comment: rare   Drug use: No    Past Surgical History:  Procedure Laterality Date   ABI  2006   normal   BREAST EXCISIONAL BIOPSY     BREAST LUMPECTOMY WITH RADIOACTIVE SEED LOCALIZATION Right 01/03/2015   Procedure: BREAST LUMPECTOMY WITH RADIOACTIVE SEED LOCALIZATION;  Surgeon: Autumn Messing III, MD;  Location: [facility];  Service: General;  Laterality: Right;   CARDIAC DEFIBRILLATOR PLACEMENT  2006; 2012   BSX single chamber ICD   carotid dopplers  2006   neg   CEREBRAL ANEURYSM REPAIR  02/1999   COLONOSCOPY     HAMMER TOE SURGERY  11/15/2020   3rd digit bilateral feet    hospitalization  2004   GI bleed, PUD, diverticulosis (EGD,colonscopy)   hospitalization     PE, NSVT, s/p defib   KNEE ARTHROSCOPY     bilateral   LEFT HEART CATHETERIZATION WITH CORONARY ANGIOGRAM N/A 02/22/2012   Procedure:  LEFT HEART CATHETERIZATION WITH CORONARY ANGIOGRAM;  Surgeon: Burnell Blanks, MD;  Location: [facility] CATH LAB;  Service: Cardiovascular;  Laterality: N/A;   PARTIAL HYSTERECTOMY     Fibroids   TONGUE BIOPSY  12/12/2017   due to sore tongue and white patches/abnormal cells   TOTAL KNEE ARTHROPLASTY Left 02/13/2016   Procedure: TOTAL KNEE ARTHROPLASTY;  Surgeon: Vickey Huger, MD;  Location: [facility];  Service: Orthopedics;  Laterality: Left;   UPPER GASTROINTESTINAL ENDOSCOPY      Family History  Problem Relation Age of Onset   Diabetes Mother    Alzheimer's disease Mother    Hypertension Mother    Obesity Mother    Other Brother        Thyroid problem 10/2016   Allergies Sister    Heart disease Sister    Heart disease Brother    Prostate cancer Brother 25       same brother as throat cancer   Throat cancer Brother        dx in his 71s; also a smoker   Heart disease Father    Cancer Maternal Grandmother 25       colon cancer or abdominal cancer   Lung cancer Maternal Grandfather 78   Breast cancer Maternal Aunt 55   Colon cancer Maternal Aunt 61       same sister as breast at 12   Lung cancer Maternal Uncle    Breast cancer Maternal Aunt        dx in her 29s   Pancreatic cancer Cousin 35       maternal first cousin   Breast cancer Cousin        paternal first cousin twice removed died in her 46s   Heart disease Son        Cardiac Arrest 07/2016   Stroke Neg Hx     Allergies  Allergen Reactions   Ace Inhibitors Swelling    Angioedema; makes tongue "break out"    Amitriptyline Hcl Other (See Comments)     makes her too sleepy!   Atorvastatin Other (See Comments)    SEVERE MYALGIA   Cymbalta [Duloxetine Hcl] Nausea Only and Other (See Comments)    Sleepiness/ sick   Dilantin [Phenytoin Sodium Extended] Rash    Severe rash   Paroxetine Nausea Only    Rapid heartbeat   Ramipril Other (See Comments)  TONGUE ULCERS    Rosuvastatin Other (See Comments)    SEVERE  MYALGIA   Carbamazepine Rash   Codeine Itching   Iodine-131 Other (See Comments)    Swelling at IV site only, no swelling or SOB around mouth.!   Phenytoin Sodium Extended Rash   Wellbutrin [Bupropion] Palpitations    Current Medications:   Current Outpatient Medications:    albuterol (PROVENTIL) (2.5 MG/3ML) 0.083% nebulizer solution, USE 1 VIAL IN NEBULIZER EVERY 6 HOURS, Disp: 120 mL, Rfl: 11   albuterol (VENTOLIN HFA) 108 (90 Base) MCG/ACT inhaler, Inhale 2 puffs into the lungs every 6 (six) hours as needed for wheezing or shortness of breath., Disp: 18 g, Rfl: 3   Ascorbic Acid (VITAMIN C) 1000 MG tablet, Take 1,000 mg by mouth daily., Disp: , Rfl:    aspirin 81 MG tablet, Take 81 mg by mouth daily., Disp: , Rfl:    baclofen (LIORESAL) 10 MG tablet, TAKE 1 TABLET BY MOUTH AT BEDTIME AS NEEDED FOR MUSCLE SPASM, Disp: 30 tablet, Rfl: 2   BLACK CURRANT SEED OIL PO, Take 1 tablet by mouth daily., Disp: , Rfl:    calcium carbonate (OS-CAL - DOSED IN MG OF ELEMENTAL CALCIUM) 1250 (500 Ca) MG tablet, Take 1 tablet by mouth., Disp: , Rfl:    carvedilol (COREG) 12.5 MG tablet, Take 1 tablet (12.5 mg total) by mouth 2 (two) times daily., Disp: 180 tablet, Rfl: 3   cholecalciferol (VITAMIN D) 1000 UNITS tablet, Take 5,000 Units by mouth daily. , Disp: , Rfl:    diclofenac Sodium (VOLTAREN) 1 % GEL, Apply 2-4 grams to affected joint 4 times daily as needed., Disp: 400 g, Rfl: 2   ELDERBERRY PO, Take by mouth., Disp: , Rfl:    fexofenadine (ALLEGRA) 180 MG tablet, Take 180 mg by mouth daily as needed for allergies or rhinitis., Disp: , Rfl:    FLUoxetine (PROZAC) 10 MG capsule, Take 1 capsule (10 mg total) by mouth daily., Disp: 90 capsule, Rfl: 0   fluticasone (FLONASE) 50 MCG/ACT nasal spray, Place 2 sprays into both nostrils daily as needed for allergies or rhinitis., Disp: 48 g, Rfl: 3   furosemide (LASIX) 40 MG tablet, Take 20 mg by mouth daily. 20-'40mg'$ , Disp: , Rfl:    hydroxypropyl  methylcellulose / hypromellose (ISOPTO TEARS / GONIOVISC) 2.5 % ophthalmic solution, Place 1 drop into both eyes daily., Disp: , Rfl:    latanoprost (XALATAN) 0.005 % ophthalmic solution, Place 1 drop into both eyes at bedtime., Disp: , Rfl:    levothyroxine (SYNTHROID) 50 MCG tablet, Take 1 tablet (50 mcg total) by mouth daily., Disp: 90 tablet, Rfl: 1   magnesium oxide (MAG-OX) 400 MG tablet, Take 1 tablet (400 mg total) by mouth in the morning, at noon, and at bedtime., Disp: 270 tablet, Rfl: 3   mometasone (ELOCON) 0.1 % lotion, Apply 1 application topically daily as needed (rash). , Disp: , Rfl:    montelukast (SINGULAIR) 10 MG tablet, Take 1 tablet (10 mg total) by mouth at bedtime., Disp: 90 tablet, Rfl: 3   Multiple Vitamins-Minerals (MULTIVITAMIN PO), Take 1 tablet by mouth daily., Disp: , Rfl:    pantoprazole (PROTONIX) 40 MG tablet, Take 1 tablet (40 mg total) by mouth daily., Disp: 30 tablet, Rfl: 11   polyethylene glycol powder (GLYCOLAX/MIRALAX) powder, Take 17 grams by mouth daily, Disp: 1080 g, Rfl: 0   potassium chloride (KLOR-CON) 10 MEQ tablet, TAKE 1/2 TABLET EVERY DAY, Disp: 45 tablet, Rfl: 3  Respiratory Therapy Supplies (FLUTTER) DEVI, 1 Device by Does not apply route as needed., Disp: 1 each, Rfl: 0   simvastatin (ZOCOR) 40 MG tablet, TAKE 1/2 TABLET EVERY DAY., Disp: 45 tablet, Rfl: 3   sodium chloride HYPERTONIC 3 % nebulizer solution, Take by nebulization 2 (two) times daily as needed for cough. 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, Disp: 750 mL, Rfl: 12   traMADol (ULTRAM) 50 MG tablet, Take 1 tablet (50 mg total) by mouth 2 (two) times daily., Disp: 180 tablet, Rfl: 1   traZODone (DESYREL) 50 MG tablet, Take 0.5-1 tablets (25-50 mg total) by mouth at bedtime as needed for sleep., Disp: 90 tablet, Rfl: 0   zinc gluconate 50 MG tablet, Take 50 mg by mouth daily., Disp: , Rfl:    zolpidem (AMBIEN) 5 MG tablet, Take 1 tablet (5 mg total) by mouth at bedtime as needed for sleep., Disp: 90  tablet, Rfl: 0   nitroGLYCERIN (NITROSTAT) 0.4 MG SL tablet, Place 1 tablet (0.4 mg total) under the tongue every 5 (five) minutes as needed., Disp: 25 tablet, Rfl: 3   Review of Systems:   ROS Negative unless otherwise specified per HPI.  Vitals:   Vitals:   04/10/21 1145  BP: 122/70  Pulse: (!) 59  Temp: (!) 97.3 F (36.3 C)  TempSrc: Temporal  SpO2: 97%  Weight: 219 lb (99.3 kg)  Height: 5' 6.5" (1.689 m)     Body mass index is 34.82 kg/m.  Physical Exam:   Physical Exam Vitals and nursing note reviewed.  Constitutional:      General: She is not in acute distress.    Appearance: She is well-developed. She is not ill-appearing or toxic-appearing.  Cardiovascular:     Rate and Rhythm: Normal rate and regular rhythm.     Pulses: Normal pulses.          Popliteal pulses are 2+ on the right side and 2+ on the left side.       Dorsalis pedis pulses are 2+ on the right side and 2+ on the left side.     Heart sounds: Normal heart sounds, S1 normal and S2 normal.     Comments: No LE edema Pulmonary:     Effort: Pulmonary effort is normal.     Breath sounds: Normal breath sounds.  Musculoskeletal:     Comments: No calf tenderness or swelling appreciated  Skin:    General: Skin is warm and dry.  Neurological:     Mental Status: She is alert.     GCS: GCS eye subscore is 4. GCS verbal subscore is 5. GCS motor subscore is 6.  Psychiatric:        Speech: Speech normal.        Behavior: Behavior normal. Behavior is cooperative.    Results for orders placed or performed in visit on 04/10/21  POCT urinalysis dipstick  Result Value Ref Range   Color, UA yellow    Clarity, UA clear    Glucose, UA Negative Negative   Bilirubin, UA neg    Ketones, UA neg    Spec Grav, UA >=1.030 (A) 1.010 - 1.025   Blood, UA neg    pH, UA 5.5 5.0 - 8.0   Protein, UA Negative Negative   Urobilinogen, UA 0.2 0.2 or 1.0 E.U./dL   Nitrite, UA neg    Leukocytes, UA Negative Negative    Appearance     Odor     *Note: Due to a large number of results  and/or encounters for the requested time period, some results have not been displayed. A complete set of results can be found in Results Review.    Assessment and Plan:   Fumiko was seen today for leg cramps and urinary frequency.  Diagnoses and all orders for this visit:  Frequency of urination Urinalysis without concerns for infection. She reports that her symptoms of frequency of urination have almost completely resolved. Will send off for urine culture and determine need for antibiotics at that time, sooner if new symptoms develop. -     POCT urinalysis dipstick -     Urine Culture  Leg cramps Unclear etiology. We will update blood work to evaluate electrolytes, iron, magnesium, creatinine kinase. Recommend ongoing adequate hydration. There seems to be confusion as to why she is on Lasix daily, I recommend that she reach out to her cardiologist who prescribes this to discuss this further. Further recommendations based on results, may need to consider ultrasounds for PVD. -     CBC with Differential/Platelet -     Comprehensive metabolic panel -     Iron, TIBC and Ferritin Panel -     Magnesium -     TSH -     CK (Creatine Kinase)  CMA or LPN served as scribe during this visit. History, Physical, and Plan performed by medical provider. The above documentation has been reviewed and is accurate and complete.   Inda Coke, PA-C

## 2021-04-11 ENCOUNTER — Other Ambulatory Visit: Payer: Self-pay | Admitting: Physician Assistant

## 2021-04-11 DIAGNOSIS — Z79899 Other long term (current) drug therapy: Secondary | ICD-10-CM

## 2021-04-11 DIAGNOSIS — R748 Abnormal levels of other serum enzymes: Secondary | ICD-10-CM

## 2021-04-11 DIAGNOSIS — E781 Pure hyperglyceridemia: Secondary | ICD-10-CM

## 2021-04-11 LAB — IRON,TIBC AND FERRITIN PANEL
%SAT: 26 % (calc) (ref 16–45)
Ferritin: 61 ng/mL (ref 16–288)
Iron: 97 ug/dL (ref 45–160)
TIBC: 376 mcg/dL (calc) (ref 250–450)

## 2021-04-11 LAB — URINE CULTURE
MICRO NUMBER:: 12273122
Result:: NO GROWTH
SPECIMEN QUALITY:: ADEQUATE

## 2021-04-12 NOTE — Telephone Encounter (Signed)
Elevated muscle enzymes  (Newest Message First) Jettie Booze, MD  You 1 hour ago (12:57 PM)   I agree with rechallenging with low dose rosuvastatin after CK normalized.  WOuld also see if TSH has been checked as increased CK can be seen with hypothyroidism.   JV    You  Jettie Booze, MD 2 hours ago (11:46 AM)   Dr. Irish Lack please advise!    [place]le, [place], RPH-CPP  You; Jettie Booze, MD 3 hours ago (10:23 AM)   CK checked 8/22 at PCP after pt reports of muscle aches and was elevated at almost 600. She was advised to stop her statin at that time. Has been taking simvastatin '20mg'$  daily for at least a decade. CK is being rechecked on 8/26. Recommend waiting until CK has normalized before restarting another medication. Would see if Dr Irish Lack is comfortable with statin rechallenge with low dose rosuvastatin '5mg'$  daily with close CK monitoring, otherwise would recommend lipid clinic referral to discuss [id number] instead.     Pt made aware of recommendations as mentioned above by our Pharmacist and Dr. Irish Lack, via [place] message.  She was advised to message [place] back if she is ok with [place] referring her to our lipid clinic for further management.  Will go ahead and place that referral.

## 2021-04-14 ENCOUNTER — Other Ambulatory Visit (INDEPENDENT_AMBULATORY_CARE_PROVIDER_SITE_OTHER): Payer: Medicare Other

## 2021-04-14 ENCOUNTER — Telehealth: Payer: Self-pay | Admitting: *Deleted

## 2021-04-14 ENCOUNTER — Other Ambulatory Visit: Payer: Self-pay

## 2021-04-14 DIAGNOSIS — R7303 Prediabetes: Secondary | ICD-10-CM | POA: Diagnosis not present

## 2021-04-14 DIAGNOSIS — R748 Abnormal levels of other serum enzymes: Secondary | ICD-10-CM

## 2021-04-14 LAB — HEPATIC FUNCTION PANEL
ALT: 37 U/L — ABNORMAL HIGH (ref 0–35)
AST: 35 U/L (ref 0–37)
Albumin: 3.9 g/dL (ref 3.5–5.2)
Alkaline Phosphatase: 86 U/L (ref 39–117)
Bilirubin, Direct: 0.1 mg/dL (ref 0.0–0.3)
Total Bilirubin: 0.5 mg/dL (ref 0.2–1.2)
Total Protein: 7 g/dL (ref 6.0–8.3)

## 2021-04-14 LAB — CK: Total CK: 200 U/L — ABNORMAL HIGH (ref 7–177)

## 2021-04-14 LAB — HEMOGLOBIN A1C: Hgb A1c MFr Bld: 5.5 % (ref 4.6–6.5)

## 2021-04-14 NOTE — Telephone Encounter (Signed)
Pt came into the today for repeat labs. Pt wanted to know what the reference range is for CK? Told pt it is 7-177, and your result was 594 that is why we repeated it. Pt verbalized understanding and asked if it could affect her heart. Told pt I am not sure but she did tell you to stop your cholesterol medication. Told her will see what repeat says and go from there. Pt verbalized understanding.

## 2021-04-16 ENCOUNTER — Other Ambulatory Visit: Payer: Self-pay | Admitting: Interventional Cardiology

## 2021-04-19 ENCOUNTER — Ambulatory Visit (INDEPENDENT_AMBULATORY_CARE_PROVIDER_SITE_OTHER): Payer: Medicare Other | Admitting: Pharmacist

## 2021-04-19 ENCOUNTER — Other Ambulatory Visit: Payer: Self-pay

## 2021-04-19 DIAGNOSIS — E781 Pure hyperglyceridemia: Secondary | ICD-10-CM

## 2021-04-19 DIAGNOSIS — T466X5A Adverse effect of antihyperlipidemic and antiarteriosclerotic drugs, initial encounter: Secondary | ICD-10-CM | POA: Diagnosis not present

## 2021-04-19 DIAGNOSIS — R748 Abnormal levels of other serum enzymes: Secondary | ICD-10-CM | POA: Diagnosis not present

## 2021-04-19 DIAGNOSIS — G720 Drug-induced myopathy: Secondary | ICD-10-CM | POA: Diagnosis not present

## 2021-04-19 LAB — CK: Total CK: 266 U/L — ABNORMAL HIGH (ref 32–182)

## 2021-04-19 NOTE — Progress Notes (Addendum)
Patient ID: [name]                 DOB: June 03, 1951                    MRN: [id number]     HPI: [name] is a 70 y.o. female patient referred to lipid clinic by Dr Irish Lack. PMH is significant for niCMP, prior LVEF 35-40% in 2013 that has since normalized per 2018 echo, defibrillator placed for ventricular tachycardia, fatty liver, fibromyalgia, HTN, HLD, TIA, Takotsubo syndrome, OSA, joint pain, obesity, and breast cancer. Pt recently saw her PCP on 8/22 and reported bilateral leg cramps that began on 8/18. Her CK was checked and was elevated at 594. She was advised to stop her simvastatin which she has taken for at least a decade. CK rechecked 4 days later and had improved to 200. ALT slightly elevated 37-63 over the past year but pt has fatty liver. She was referred to lipid clinic for further management.  Pt presents today in good spirits. She has taken simvastatin for the past 10+ years. Started noticing muscle aches about 2 years ago but didn't necessarily attribute it to her statin. A few weeks ago, aches became worse to the point where she couldn't stand up from sitting on the toilet. PCP checked her CK which was elevated at 594. She was advised to stop her simvastatin, repeat CK 4 days later was still mildly elevated but improved to 200. She took rosuvastatin in the past as well which caused her to feel sick (muscle cramps, felt like her heart was racing). She thinks she may have tried atorvastatin as well.  Has been taking her furosemide prn instead of daily for a while as well, noticed more cramping on daily dosing. Has not needed to use this recently. Typically doesn't report swelling in her ankles/feet unless she's traveling or walking for long periods of time.   Current Medications: none Intolerances: simvastatin 47m daily - CK elevation to 594, rosuvastatin - felt sick (cramping, heart racing), ? atorvastatin Risk Factors: TIA, HTN, age LDL goal:  <739mdL  Diet: reports she could eat better, had [employer] this AM  Exercise: Walking limited by knee pain, having surgery next month. Did start tai chi last night.  Family History: Alzheimer's disease in her mother; Breast cancer in her cousin and maternal aunt; Breast cancer (age of onset: 5515in her maternal aunt; Cancer (age of onset: 65100in her maternal grandmother; Colon cancer (age of onset: 6140in her maternal aunt; Diabetes in her mother; Heart disease in her brother, father, sister, and son (brother - rheumatic heart disease, [place], son also with CMP and cardiac arrest); Hypertension in her mother; Lung cancer in her maternal uncle; Lung cancer (age of onset: 7858in her maternal grandfather; Obesity in her mother; Other in her brother; Pancreatic cancer (age of onset: 607in her cousin; Prostate cancer (age of onset: 6912in her brother; Throat cancer in her brother.   Social History: Quit smoking about 30 years ago. Her smoking use included cigarettes. She has a 15.00 pack-year smoking history. She has never used smokeless tobacco. She reports current alcohol use. She reports that she does not use drugs.   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   Bronchiectasis ([place])    Cerebral aneurysm, nonruptured    had a clip put in   CHF (congestive heart failure) (HCC)    Clostridium difficile infection    Depressive disorder, not elsewhere classified    Diverticulosis of colon (without mention of hemorrhage)    Esophageal reflux    Fatty liver    Fibromyalgia    Gastritis    GERD (gastroesophageal reflux disease)    GI bleed 2004   Glaucoma    Hiatal hernia    Hyperlipidemia    Hypertension    Hypothyroidism    Internal  hemorrhoids    Joint pain    Obstructive sleep apnea (adult) (pediatric)    Osteoarthritis    Ostium secundum type atrial septal defect    Other chronic nonalcoholic liver disease    Other pulmonary embolism and infarction    Palpitations    Paroxysmal ventricular tachycardia ([place])    Personal history of radiation therapy    Pneumonia    history   Presence of permanent cardiac pacemaker    PUD (peptic ulcer disease)    Radiation 02/03/15-03/10/15   Right Breast   Sarcoid    per pt , not sure   Schatzki's ring    Shortness of breath    Sleep apnea    Stroke [facility]) 2013   tia/ pt feels it was around 2008 0r 2009   Takotsubo syndrome    Tubular adenoma of colon    Unspecified transient cerebral ischemia    Unspecified vitamin D deficiency     Current Outpatient Medications on File Prior to Visit  Medication Sig Dispense Refill   albuterol (PROVENTIL) (2.5 MG/3ML) 0.083% nebulizer solution USE 1 VIAL IN NEBULIZER EVERY 6 HOURS 120 mL 11   albuterol (VENTOLIN HFA) 108 (90 Base) MCG/ACT inhaler Inhale 2 puffs into the lungs every 6 (six) hours as needed for wheezing or shortness of breath. 18 g 3   Ascorbic Acid (VITAMIN C) 1000 MG tablet Take 1,000 mg by mouth daily.     aspirin 81 MG tablet Take 81 mg by mouth daily.     baclofen (LIORESAL) 10 MG tablet TAKE 1 TABLET BY MOUTH AT BEDTIME AS NEEDED FOR MUSCLE SPASM 30 tablet 2   BLACK CURRANT SEED OIL PO Take 1 tablet by mouth daily.     calcium carbonate (OS-CAL - DOSED IN MG OF ELEMENTAL CALCIUM) 1250 (500 Ca) MG tablet Take 1 tablet by mouth.     carvedilol (COREG) 12.5 MG tablet Take 1 tablet (12.5 mg total) by mouth 2 (two) times daily. 180 tablet 3   cholecalciferol (VITAMIN D) 1000 UNITS tablet Take 5,000 Units by mouth daily.      diclofenac Sodium (VOLTAREN) 1 % GEL Apply 2-4 grams to affected joint 4 times daily as needed. 400 g 2   ELDERBERRY PO Take by mouth.     fexofenadine (ALLEGRA) 180 MG tablet Take 180 mg by mouth  daily as needed for allergies or rhinitis.     FLUoxetine (PROZAC) 10 MG capsule Take 1 capsule (10 mg total) by mouth daily. 90 capsule 0   fluticasone (FLONASE) 50 MCG/ACT nasal spray Place 2 sprays into both nostrils daily as needed for allergies or rhinitis. 48 g 3   furosemide (LASIX) 40 MG tablet Take 20 mg by mouth daily. 20-71m     hydroxypropyl methylcellulose / hypromellose (  ISOPTO TEARS / GONIOVISC) 2.5 % ophthalmic solution Place 1 drop into both eyes daily.     latanoprost (XALATAN) 0.005 % ophthalmic solution Place 1 drop into both eyes at bedtime.     levothyroxine (SYNTHROID) 50 MCG tablet Take 1 tablet (50 mcg total) by mouth daily. 90 tablet 1   magnesium oxide (MAG-OX) 400 MG tablet Take 1 tablet (400 mg total) by mouth in the morning, at noon, and at bedtime. 270 tablet 3   mometasone (ELOCON) 0.1 % lotion Apply 1 application topically daily as needed (rash).      montelukast (SINGULAIR) 10 MG tablet Take 1 tablet (10 mg total) by mouth at bedtime. 90 tablet 3   Multiple Vitamins-Minerals (MULTIVITAMIN PO) Take 1 tablet by mouth daily.     nitroGLYCERIN (NITROSTAT) 0.4 MG SL tablet Place 1 tablet (0.4 mg total) under the tongue every 5 (five) minutes as needed. 25 tablet 3   pantoprazole (PROTONIX) 40 MG tablet Take 1 tablet (40 mg total) by mouth daily. 30 tablet 11   polyethylene glycol powder (GLYCOLAX/MIRALAX) powder Take 17 grams by mouth daily 1080 g 0   potassium chloride (KLOR-CON) 10 MEQ tablet TAKE 1/2 TABLET EVERY DAY 15 tablet 0   Respiratory Therapy Supplies (FLUTTER) DEVI 1 Device by Does not apply route as needed. 1 each 0   simvastatin (ZOCOR) 40 MG tablet TAKE 1/2 TABLET EVERY DAY. 45 tablet 3   sodium chloride HYPERTONIC 3 % nebulizer solution Take by nebulization 2 (two) times daily as needed for cough. 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 750 mL 12   traMADol (ULTRAM) 50 MG tablet Take 1 tablet (50 mg total) by mouth 2 (two) times daily. 180 tablet 1   traZODone (DESYREL)  50 MG tablet Take 0.5-1 tablets (25-50 mg total) by mouth at bedtime as needed for sleep. 90 tablet 0   zinc gluconate 50 MG tablet Take 50 mg by mouth daily.     zolpidem (AMBIEN) 5 MG tablet Take 1 tablet (5 mg total) by mouth at bedtime as needed for sleep. 90 tablet 0   No current facility-administered medications on file prior to visit.    Allergies  Allergen Reactions   Ace Inhibitors Swelling    Angioedema; makes tongue "break out"    Amitriptyline Hcl Other (See Comments)     makes her too sleepy!   Atorvastatin Other (See Comments)    SEVERE MYALGIA   Cymbalta [Duloxetine Hcl] Nausea Only and Other (See Comments)    Sleepiness/ sick   Dilantin [Phenytoin Sodium Extended] Rash    Severe rash   Paroxetine Nausea Only    Rapid heartbeat   Ramipril Other (See Comments)    TONGUE ULCERS    Rosuvastatin Other (See Comments)    SEVERE MYALGIA   Carbamazepine Rash   Codeine Itching   Iodine-131 Other (See Comments)    Swelling at IV site only, no swelling or SOB around mouth.!   Phenytoin Sodium Extended Rash   Wellbutrin [Bupropion] Palpitations    Assessment/Plan:  1. Hyperlipidemia - LDL 148m/dL on simvastatin 239mdaily which she is no longer taking secondary to myalgias with CK elevation. LDL goal < 70 due to her hx of TIA. She is also intolerant to rosuvastatin. Discussed PCSK9i and ezetimibe as options today with a preference for PCSK9i due to better efficacy. Will recheck CK today to ensure it's trending back into the normal range, and will submit prior authorization for Repatha. Reviewed injection technique, expected benefits and side effects  with pt today, as well as ~$45/copay per month.  [name] E. [name], PharmD, BCACP, [place] [address] [place], [place] [address] Phone: [phone]; Fax: [phone] 04/19/2021 11:18 AM

## 2021-04-19 NOTE — Patient Instructions (Signed)
Your LDL goal is < 70. Your LDL was 100 in May when you were taking simvastatin '20mg'$  daily  We will recheck your CK today   Medication options include: -a PCSK9 inhibitor like Praluent or Repatha. These are subcutaneous injections given ever 2 weeks in the fatty tissue of your stomach or upper outer thigh. They lower your LDL cholesterol by 60% and help to reduce your risk of heart attacks and strokes -ezetimibe (Zetia). This is a daily pill that lowers your LDL by 20% and is not a statin

## 2021-04-20 ENCOUNTER — Ambulatory Visit (INDEPENDENT_AMBULATORY_CARE_PROVIDER_SITE_OTHER): Payer: Medicare Other | Admitting: Podiatry

## 2021-04-20 ENCOUNTER — Telehealth (INDEPENDENT_AMBULATORY_CARE_PROVIDER_SITE_OTHER): Payer: Medicare Other | Admitting: Family Medicine

## 2021-04-20 ENCOUNTER — Encounter (INDEPENDENT_AMBULATORY_CARE_PROVIDER_SITE_OTHER): Payer: Self-pay | Admitting: Family Medicine

## 2021-04-20 ENCOUNTER — Telehealth: Payer: Self-pay | Admitting: Pharmacist

## 2021-04-20 ENCOUNTER — Encounter: Payer: Self-pay | Admitting: Podiatry

## 2021-04-20 DIAGNOSIS — I10 Essential (primary) hypertension: Secondary | ICD-10-CM

## 2021-04-20 DIAGNOSIS — L910 Hypertrophic scar: Secondary | ICD-10-CM | POA: Diagnosis not present

## 2021-04-20 DIAGNOSIS — R748 Abnormal levels of other serum enzymes: Secondary | ICD-10-CM | POA: Diagnosis not present

## 2021-04-20 DIAGNOSIS — M2041 Other hammer toe(s) (acquired), right foot: Secondary | ICD-10-CM

## 2021-04-20 DIAGNOSIS — G4700 Insomnia, unspecified: Secondary | ICD-10-CM

## 2021-04-20 DIAGNOSIS — M2042 Other hammer toe(s) (acquired), left foot: Secondary | ICD-10-CM | POA: Diagnosis not present

## 2021-04-20 DIAGNOSIS — M778 Other enthesopathies, not elsewhere classified: Secondary | ICD-10-CM

## 2021-04-20 DIAGNOSIS — Z6836 Body mass index (BMI) 36.0-36.9, adult: Secondary | ICD-10-CM

## 2021-04-20 MED ORDER — TRAZODONE HCL 50 MG PO TABS: 25.0000 mg | ORAL_TABLET | Freq: Every evening | ORAL | 0 refills | Status: DC | PRN

## 2021-04-20 NOTE — Telephone Encounter (Addendum)
CK remains elevated 10 days off simvastatin. Will recheck again in another 10 days and not start LLT until CK is back in normal range. Pt does not wish to try Repatha after she researched the med, she is concerned about potential for hyperglycemia and UTIs. Discussed very low prevalence of these side effects. She seems agreeable to try Nexlizet instead which is also a Tier 3 med at $45/month. Prior authorization has been submitted and approved through 08/19/21, will reach out to pt once CK results in another 10 days and start Nexlizet if labs have normalized.

## 2021-04-21 NOTE — Progress Notes (Signed)
Subjective:   Patient ID: [name], female   DOB: 70 y.o.   MRN: [id number]   HPI Patient is concerned about tissue which has occurred on the digits bilateral and also to the tissue underneath both feet.  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  I did discuss possible steroid injections but the areas are so small I do not recommend this at the current time

## 2021-04-23 NOTE — Progress Notes (Signed)
Subjective:    [name] - 70 y.o. female MRN [id number]  Date of birth: 1951/06/11  HPI  Ayzel Stigall is to establish care.   Current issues and/or concerns: Concerns about blood pressure being high today in office. Followed by Cardiology for hypertension, plans to follow-up soon. Had one dose of Carvedilol for today and the next dose scheduled for later today. Reports does not monitor blood pressure consistently at home. Monitoring salt in the diet. Not exercising as much as she would like per recommendation of Cardiology and discontinued off statin. Has knee surgery scheduled soon. Denies red flag symptoms.   Followed by Pulmonology for bronchiectasis.   Requesting refills of Elocon. Reports intermittent itching rash.     ROS per HPI    Health Maintenance:  Health Maintenance Due  Topic Date Due   Zoster Vaccines- Shingrix (1 of 2) Never done   TETANUS/TDAP  09/08/2019    Past Medical History: Patient Active Problem List   Diagnosis Date Noted   Statin myopathy 04/19/2021   Pain in joint of right shoulder 02/10/2021   Arthritis of both acromioclavicular joints 01/02/2021   Trochanteric bursitis of right hip 10/03/2020   Tailbone injury 05/05/2020   UTI (urinary tract infection) 05/05/2020   Insomnia 05/05/2020   Leg cramps 02/16/2020   Dysuria 02/16/2020   Right hip impingement syndrome 02/01/2020   Right bicipital tenosynovitis 02/01/2020   Bronchiectasis without complication ([place]) 0000000   Myofascial pain 12/21/2019   Estrogen deficiency 12/10/2019   Obesity (BMI 30-39.9) 12/10/2019   Bronchiectasis with (acute) exacerbation ([place]) 10/23/2019   Elevated LFTs 10/15/2019   Lower abdominal pain 09/01/2019   Neck pain 10/25/2017   Paresthesias in left hand 10/25/2017   Paresthesia of foot, bilateral 04/12/2017   Primary osteoarthritis of both hands 09/27/2016   Primary osteoarthritis of both knees 09/27/2016   TMJ pain  dysfunction syndrome 06/12/2016   Nasopharyngitis, 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/2016   S/P total knee replacement 02/13/2016   Chronic pain of both knees 12/06/2015   Globus pharyngeus 05/17/2015   Genetic testing 12/23/2014   Family history of breast cancer    Family history of colon cancer    Family history of pancreatic cancer    History of breast cancer 11/23/2014   Allergy to ACE inhibitors 09/27/2014   Prediabetes 06/23/2013   RLS (restless legs syndrome) 01/15/2013   Hx of Clostridium difficile infection 10/10/2012   Dyspnea on exertion 04/02/2012   NICM (nonischemic cardiomyopathy) ([place]) 03/07/2012   Post-menopausal 01/11/2012   Obstructive sleep apnea 04/04/2010   V-tach ([place]) 01/03/2009   ICD  [employer]  Single chamber 01/03/2009   PFO (patent foramen ovale) 09/29/2008   Vitamin D deficiency 08/17/2008   Hypothyroidism 11/18/2006   Hyperlipidemia 11/18/2006   Depression with anxiety 11/18/2006   Essential hypertension 11/18/2006   Mitral valve prolapse 11/18/2006   Cerebral aneurysm 11/18/2006   Allergic rhinitis 11/18/2006   GERD 11/18/2006   Diverticulosis of colon 11/18/2006   Fatty liver 11/18/2006   Fibromyalgia 11/18/2006    Social History   reports that she quit smoking about 31 years ago. Her smoking use included cigarettes. She has a 15.00 pack-year smoking history. She has never used smokeless tobacco. She reports current alcohol use. She reports that she does not currently use drugs after having used the following drugs: Marijuana.   Family History  family history includes Allergies in her sister; Alzheimer's disease in her mother; Breast cancer in her cousin and maternal aunt; Breast cancer (  age of onset: 6) in her maternal aunt; Cancer (age of onset: 4) in her maternal grandmother; Colon cancer (age of onset: 75) in her maternal aunt; Diabetes in her mother; Heart disease in her brother, father, sister, and son; Hypertension in her mother; Lung cancer in her  maternal uncle; Lung cancer (age of onset: 75) in her maternal grandfather; Obesity in her mother; Other in her brother; Pancreatic cancer (age of onset: 81) in her cousin; Prostate cancer (age of onset: 41) in her brother; Throat cancer in her brother.   Medications: reviewed and updated   Objective:   Physical Exam BP (!) 145/83 (BP Location: Left Arm, Patient Position: Sitting, Cuff Size: Normal)   Pulse 61   Temp 98.2 F (36.8 C)   Resp 18   Ht 5' 6.5" (1.689 m)   Wt 208 lb 3.2 oz (94.4 kg)   LMP  (LMP Unknown)   SpO2 97%   BMI 33.10 kg/m   Physical Exam HENT:     Head: Normocephalic and atraumatic.  Eyes:     Extraocular Movements: Extraocular movements intact.     Conjunctiva/sclera: Conjunctivae normal.     Pupils: Pupils are equal, round, and reactive to light.  Cardiovascular:     Rate and Rhythm: Normal rate and regular rhythm.     Pulses: Normal pulses.     Heart sounds: Normal heart sounds.  Pulmonary:     Effort: Pulmonary effort is normal.     Breath sounds: Normal breath sounds.  Neurological:     General: No focal deficit present.     Mental Status: She is alert and oriented to person, place, and time.  Psychiatric:        Mood and Affect: Mood normal.        Behavior: Behavior normal.       Assessment & Plan:  1. Encounter to establish care: - Patient presents today to establish care.  - Return for annual physical examination, labs, and health maintenance. Arrive fasting meaning having no food for at least 8 hours prior to appointment. You may have only water or black coffee. Please take scheduled medications as normal.  2. Essential hypertension: - Blood pressure not at goal during today's visit. Patient asymptomatic without chest pressure, chest pain, palpitations, shortness of breath, worst headache of life, and any additional red flag symptoms. - Keep all scheduled appointments with Cardiology. Encouraged to check blood pressures consistently in  the home setting.   3. Rash and nonspecific skin eruption: - Continue Mometasone as prescribed.  - Follow-up with primary provider as scheduled.  - mometasone (ELOCON) 0.1 % lotion; Apply 1 application topically daily as needed (rash).  Dispense: 60 mL; Refill: 1  4. Need for influenza vaccination: - Administered today in office.  - Flu Vaccine QUAD 7moIM (Fluarix, Fluzone & Alfiuria Quad PF)    Patient was given clear instructions to go to Emergency Department or return to medical center if symptoms don't improve, worsen, or new problems develop.The patient verbalized understanding.  I discussed the assessment and treatment plan with the patient. The patient was provided an opportunity to ask questions and all were answered. The patient agreed with the plan and demonstrated an understanding of the instructions.   The patient was advised to call back or seek an in-person evaluation if the symptoms worsen or if the condition fails to improve as anticipated.    ADurene Fruits NP 04/27/2021, 12:49 PM [facility]

## 2021-04-25 NOTE — Progress Notes (Signed)
TeleHealth Visit:  Due to the COVID-19 pandemic, this visit was completed with telemedicine (audio/video) technology to reduce patient and provider exposure as well as to preserve personal protective equipment.   [name] has verbally consented to this TeleHealth visit. The patient is located at home, the provider is located at the [facility] office. The participants in this visit include the listed provider and patient. The visit was conducted today via MyChart video.  Chief Complaint: OBESITY [name] is here to discuss her progress with her obesity treatment plan along with follow-up of her obesity related diagnoses. [name] is on practicing portion control and making smarter food choices, such as increasing vegetables and decreasing simple carbohydrates and states she is following her eating plan approximately 100% of the time. [name] states she is doing cardio for 30 minutes 7 times per week.  Today's visit was #: 22 Starting weight: 223 lbs Starting date: 10/01/2019  Interim History: [name] has a TKA scheduled for 9/19.  Her CK is elevated, statin was discontinued.  Assessment/Plan:   1. Elevated CK, statin myopathy Trend followed by Cardiology.    Plan:  Recommend increasing water intake.  Okay Tai Chi, but hold on bike.  Already discontinued statin and discussing Repatha/Nexlizet as future options.  2. Essential hypertension Elevated at home, but Fred thinks it is due to stress.  Blood pressure 135/82, heart rate 63. Medications: Coreg 12.5 mg twice daily.   Plan: Avoid buying foods that are: processed, frozen, or prepackaged to avoid excess salt. We will watch for signs of hypotension as she continues lifestyle modifications.  BP Readings from Last 3 Encounters:  04/10/21 122/70  04/04/21 118/68  03/23/21 131/72   Lab Results  Component Value Date   CREATININE 0.95 04/10/2021   3. Insomnia, unspecified type This is well controlled with medication.  Current  treatment: trazodone 25-50 mg at bedtime as needed for sleep.  Plan:  Will refill trazodone today.  Recommend sleep hygiene measures including regular sleep schedule, optimal sleep environment, and relaxing presleep rituals.   - Refill traZODone (DESYREL) 50 MG tablet; Take 0.5-1 tablets (25-50 mg total) by mouth at bedtime as needed for sleep.  Dispense: 90 tablet; Refill: 0  4. Obesity, current BMI 35.5  [name] is currently in the action stage of change. As such, her goal is to continue with weight loss efforts. She has agreed to practicing portion control and making smarter food choices, such as increasing vegetables and decreasing simple carbohydrates.   Exercise goals:  Low impact.  Behavioral modification strategies: increasing water intake.  [name] has agreed to follow-up with our clinic in 4 weeks. She was informed of the importance of frequent follow-up visits to maximize her success with intensive lifestyle modifications for her multiple health conditions.  Objective:   VITALS: Per patient if applicable, see vitals. GENERAL: Alert and in no acute distress. CARDIOPULMONARY: No increased WOB. Speaking in clear sentences.  PSYCH: Pleasant and cooperative. Speech normal rate and rhythm. Affect is appropriate. Insight and judgement are appropriate. Attention is focused, linear, and appropriate.  NEURO: Oriented as arrived to appointment on time with no prompting.   Lab Results  Component Value Date   CREATININE 0.95 04/10/2021   BUN 23 04/10/2021   NA 139 04/10/2021   K 4.3 04/10/2021   CL 106 04/10/2021   CO2 27 04/10/2021   Lab Results  Component Value Date   ALT 37 (H) 04/14/2021   AST 35 04/14/2021   ALKPHOS 86 04/14/2021  BILITOT 0.5 04/14/2021   Lab Results  Component Value Date   HGBA1C 5.5 04/14/2021   HGBA1C 5.6 02/15/2021   HGBA1C 5.3 12/07/2019   HGBA1C 5.7 (H) 10/01/2019   HGBA1C 5.4 12/04/2018   Lab Results  Component Value Date   INSULIN 60.3 (H)  02/15/2021   INSULIN 15.6 10/01/2019   Lab Results  Component Value Date   TSH 1.55 04/10/2021   Lab Results  Component Value Date   CHOL 178 12/20/2020   HDL 59.30 12/20/2020   LDLCALC 100 (H) 12/20/2020   LDLDIRECT 141.8 07/04/2007   TRIG 92.0 12/20/2020   CHOLHDL 3 12/20/2020   Lab Results  Component Value Date   VD25OH 56.9 02/15/2021   VD25OH 61.53 12/07/2019   VD25OH 50.9 10/01/2019   Lab Results  Component Value Date   WBC 5.6 04/10/2021   HGB 12.3 04/10/2021   HCT 38.2 04/10/2021   MCV 92.6 04/10/2021   PLT 191.0 04/10/2021   Lab Results  Component Value Date   IRON 97 04/10/2021   TIBC 376 04/10/2021   FERRITIN 61 04/10/2021   Attestation Statements:   Reviewed by clinician on day of visit: allergies, medications, problem list, medical history, surgical history, family history, social history, and previous encounter notes.  I, Water quality scientist, CMA, am acting as transcriptionist for Briscoe Deutscher, DO  I have reviewed the above documentation for accuracy and completeness, and I agree with the above. Briscoe Deutscher, DO

## 2021-04-25 NOTE — Progress Notes (Signed)
DUE TO COVID-19 ONLY ONE VISITOR IS ALLOWED TO COME WITH YOU AND STAY IN THE WAITING ROOM ONLY DURING PRE OP AND PROCEDURE DAY OF SURGERY.  2 VISITOR  MAY VISIT WITH YOU AFTER SURGERY IN YOUR PRIVATE ROOM DURING VISITING HOURS ONLY!  YOU NEED TO HAVE A COVID 19 TEST ON__9/15/2022 ____'@_'$  '@_from'$  8am-3pm _____, THIS TEST MUST BE DONE BEFORE SURGERY,  Covid test is done at [place], [place] [address].  This is a drive thru.  No appt required. Please see map.                 Your procedure is scheduled on:  05/08/2021   Report to [facility] Main  Entrance   Report to admitting at    [phone]     Call this number if you have problems the morning of surgery [phone]    REMEMBER: NO  SOLID FOOD CANDY OR GUM AFTER MIDNIGHT. CLEAR LIQUIDS UNTIL 0415am         . NOTHING BY MOUTH EXCEPT CLEAR LIQUIDS UNTIL    . PLEASE FINISH ENSURE [phone]  PER SURGEON ORDER  WHICH NEEDS TO BE COMPLETED AT   0415am    .      CLEAR LIQUID DIET   Foods Allowed                                                                    Coffee and tea, regular and decaf                            Fruit ices (not with fruit pulp)                                      Iced Popsicles                                    Carbonated beverages, regular and diet                                    Cranberry, grape and apple juices Sports drinks like Gatorade Lightly seasoned clear broth or consume(fat free) Sugar, honey syrup ___________________________________________________________________      BRUSH YOUR TEETH MORNING OF SURGERY AND RINSE YOUR MOUTH OUT, NO CHEWING GUM CANDY OR MINTS.     Take these medicines the morning of surgery with A SIP OF WATER: protonix, synthroid, coreg, nebulizer if needed, inhalers as usual and bring  DO NOT TAKE ANY DIABETIC MEDICATIONS DAY OF YOUR SURGERY                               You may not have any metal on your body including hair pins and               piercings  Do not wear jewelry, make-up, lotions, powders or perfumes, deodorant  Do not wear nail polish on your fingernails.  Do not shave  48 hours prior to surgery.              Men may shave face and neck.   Do not bring valuables to the hospital. [place].  Contacts, dentures or bridgework may not be worn into surgery.  Leave suitcase in the car. After surgery it may be brought to your room.     Patients discharged the day of surgery will not be allowed to drive home. IF YOU ARE HAVING SURGERY AND GOING HOME THE SAME DAY, YOU MUST HAVE AN ADULT TO DRIVE YOU HOME AND BE WITH YOU FOR 24 HOURS. YOU MAY GO HOME BY TAXI OR UBER OR ORTHERWISE, BUT AN ADULT MUST ACCOMPANY YOU HOME AND STAY WITH YOU FOR 24 HOURS.  Name and phone number of your driver:  Special Instructions: N/A              Please read over the following fact sheets you were given: _____________________________________________________________________  [facility] - Preparing for Surgery Before surgery, you can play an important role.  Because skin is not sterile, your skin needs to be as free of germs as possible.  You can reduce the number of germs on your skin by washing with CHG (chlorahexidine gluconate) soap before surgery.  CHG is an antiseptic cleaner which kills germs and bonds with the skin to continue killing germs even after washing. Please DO NOT use if you have an allergy to CHG or antibacterial soaps.  If your skin becomes reddened/irritated stop using the CHG and inform your nurse when you arrive at Short Stay. Do not shave (including legs and underarms) for at least 48 hours prior to the first CHG shower.  You may shave your face/neck. Please follow these instructions carefully:  1.  Shower with CHG Soap the night before surgery and the  morning of Surgery.  2.  If you choose to wash your hair, wash your hair first as usual with your  normal   shampoo.  3.  After you shampoo, rinse your hair and body thoroughly to remove the  shampoo.                           4.  Use CHG as you would any other liquid soap.  You can apply chg directly  to the skin and wash                       Gently with a scrungie or clean washcloth.  5.  Apply the CHG Soap to your body ONLY FROM THE NECK DOWN.   Do not use on face/ open                           Wound or open sores. Avoid contact with eyes, ears mouth and genitals (private parts).                       Wash face,  Genitals (private parts) with your normal soap.             6.  Wash thoroughly, paying special attention to the area where your surgery  will be performed.  7.  Thoroughly rinse your body with  warm water from the neck down.  8.  DO NOT shower/wash with your normal soap after using and rinsing off  the CHG Soap.                9.  Pat yourself dry with a clean towel.            10.  Wear clean pajamas.            11.  Place clean sheets on your bed the night of your first shower and do not  sleep with pets. Day of Surgery : Do not apply any lotions/deodorants the morning of surgery.  Please wear clean clothes to the hospital/surgery center.  FAILURE TO FOLLOW THESE INSTRUCTIONS MAY RESULT IN THE CANCELLATION OF YOUR SURGERY PATIENT SIGNATURE_________________________________  NURSE SIGNATURE__________________________________  ________________________________________________________________________

## 2021-04-26 ENCOUNTER — Other Ambulatory Visit: Payer: Self-pay

## 2021-04-26 ENCOUNTER — Encounter (HOSPITAL_COMMUNITY)
Admission: RE | Admit: 2021-04-26 | Discharge: 2021-04-26 | Disposition: A | Discharge status: Home / Self Care | Payer: Medicare Other | Source: Ambulatory Visit | Attending: Orthopedic Surgery | Admitting: Orthopedic Surgery

## 2021-04-26 ENCOUNTER — Encounter (HOSPITAL_COMMUNITY): Payer: Self-pay

## 2021-04-26 DIAGNOSIS — Z01818 Encounter for other preprocedural examination: Secondary | ICD-10-CM | POA: Diagnosis present

## 2021-04-26 HISTORY — DX: Nausea with vomiting, unspecified: R11.2

## 2021-04-26 HISTORY — DX: Other specified postprocedural states: Z98.890

## 2021-04-26 LAB — CBC WITH DIFFERENTIAL/PLATELET
Abs Immature Granulocytes: 0.01 10*3/uL (ref 0.00–0.07)
Basophils Absolute: 0 10*3/uL (ref 0.0–0.1)
Basophils Relative: 1 %
Eosinophils Absolute: 0.1 10*3/uL (ref 0.0–0.5)
Eosinophils Relative: 3 %
HCT: 39.9 % (ref 36.0–46.0)
Hemoglobin: 12.4 g/dL (ref 12.0–15.0)
Immature Granulocytes: 0 %
Lymphocytes Relative: 22 %
Lymphs Abs: 0.8 10*3/uL (ref 0.7–4.0)
MCH: 29.5 pg (ref 26.0–34.0)
MCHC: 31.1 g/dL (ref 30.0–36.0)
MCV: 95 fL (ref 80.0–100.0)
Monocytes Absolute: 0.3 10*3/uL (ref 0.1–1.0)
Monocytes Relative: 9 %
Neutro Abs: 2.4 10*3/uL (ref 1.7–7.7)
Neutrophils Relative %: 65 %
Platelets: 168 10*3/uL (ref 150–400)
RBC: 4.2 MIL/uL (ref 3.87–5.11)
RDW: 12.7 % (ref 11.5–15.5)
WBC: 3.6 10*3/uL — ABNORMAL LOW (ref 4.0–10.5)
nRBC: 0 % (ref 0.0–0.2)

## 2021-04-26 LAB — COMPREHENSIVE METABOLIC PANEL
ALT: 34 U/L (ref 0–44)
AST: 35 U/L (ref 15–41)
Albumin: 3.8 g/dL (ref 3.5–5.0)
Alkaline Phosphatase: 83 U/L (ref 38–126)
Anion gap: 3 — ABNORMAL LOW (ref 5–15)
BUN: 13 mg/dL (ref 8–23)
CO2: 28 mmol/L (ref 22–32)
Calcium: 10.1 mg/dL (ref 8.9–10.3)
Chloride: 111 mmol/L (ref 98–111)
Creatinine, Ser: 0.81 mg/dL (ref 0.44–1.00)
GFR, Estimated: >60 mL/min (ref >60)
Glucose, Bld: 89 mg/dL (ref 70–99)
Potassium: 4.2 mmol/L (ref 3.5–5.1)
Sodium: 142 mmol/L (ref 135–145)
Total Bilirubin: 0.7 mg/dL (ref 0.3–1.2)
Total Protein: 7 g/dL (ref 6.5–8.1)

## 2021-04-26 LAB — SURGICAL PCR SCREEN
MRSA, PCR: NEGATIVE
Staphylococcus aureus: NEGATIVE

## 2021-04-26 NOTE — Progress Notes (Addendum)
Anesthesia Review:  PCP: DR Inda Coke Lov 04/10/21  Medical clearance on chart dated 03/07/21.   Cardiologist : DR Irish Lack- LOV 05/12/20  Georgeanna Harrison, PA- LOV- 12/21/20 Last Device Check- 03/01/21 Has pacer/ICD  Per Encounters - clearance faxed to DR Ronnie Derby office.  Called and LVMM for Ezzard Standing at [phone] and requested all clearances be faxed.  Cardiology clearance dated 01/26/21 on chart by Annamaria Boots on chart   See Patient Message under encounters dated 04/20/21.   Pulmonology- DR Chesley Mires- LOV 04/04/21.  Clearance on chart dated 01/31/21.  With OV note of 01/10/21 on chart  Chest x-ray : EKG : 04/26/2021  Echo : Stress test: 04/13/20  Cardiac Cath :  Activity level: can do a flight of stairs without difficulty  Sleep Study/ CPAP : has cpap  Fasting Blood Sugar :      / Checks Blood Sugar -- times a day:   Blood Thinner/ Instructions /Last Dose: ASA / Instructions/ Last Dose :   Hgba1c-04/14/21- 5.5  81 mg Aspirin- PT to check with surgeon/cardiology regarding preop instructons regarding Aspirin.  Covid test on 05/04/21

## 2021-04-27 ENCOUNTER — Encounter: Payer: Self-pay | Admitting: Family

## 2021-04-27 ENCOUNTER — Other Ambulatory Visit: Payer: Self-pay

## 2021-04-27 ENCOUNTER — Ambulatory Visit (INDEPENDENT_AMBULATORY_CARE_PROVIDER_SITE_OTHER): Payer: Medicare Other | Admitting: Family

## 2021-04-27 VITALS — BP 145/83 | HR 61 | Temp 98.2°F | Resp 18 | Ht 66.5 in | Wt 208.2 lb

## 2021-04-27 DIAGNOSIS — Z23 Encounter for immunization: Secondary | ICD-10-CM

## 2021-04-27 DIAGNOSIS — I10 Essential (primary) hypertension: Secondary | ICD-10-CM

## 2021-04-27 DIAGNOSIS — R21 Rash and other nonspecific skin eruption: Secondary | ICD-10-CM

## 2021-04-27 DIAGNOSIS — Z7689 Persons encountering health services in other specified circumstances: Secondary | ICD-10-CM | POA: Diagnosis not present

## 2021-04-27 MED ORDER — MOMETASONE FUROATE 0.1 % EX SOLN: 1.0000 "application " | Freq: Every day | CUTANEOUS | 1 refills | Status: DC | PRN

## 2021-04-27 NOTE — Patient Instructions (Signed)
Thank you for choosing Primary Care at [facility] for your medical home!    [name] was seen by Camillia Herter, NP today.   [name]'s primary care provider is Felicita Nuncio Zachery Dauer, NP.   For the best care possible,  you should try to see Durene Fruits, NP whenever you come to clinic.   We look forward to seeing you again soon!  If you have any questions about your visit today,  please call us at [phone]  Or feel free to reach your provider via [place].    Keeping you healthy   Get these tests Blood pressure- Have your blood pressure checked once a year by your healthcare provider.  Normal blood pressure is 120/80. Weight- Have your body mass index (BMI) calculated to screen for obesity.  BMI is a measure of body fat based on height and weight. You can also calculate your own BMI at [employer]velB[employer].it. Cholesterol- Have your cholesterol checked regularly starting at age 110, sooner may be necessary if you have diabetes, high blood pressure, if a family member developed heart diseases at an early age or if you smoke.  Chlamydia, HIV, and other sexual transmitted disease- Get screened each year until the age of 46 then within three months of each new sexual partner. Diabetes- Have your blood sugar checked regularly if you have high blood pressure, high cholesterol, a family history of diabetes or if you are overweight.   Get these vaccines Flu shot- Every fall. Tetanus shot- Every 10 years. Menactra- Single dose; prevents meningitis.   Take these steps Don't smoke- If you do smoke, ask your healthcare provider about quitting. For tips on how to quit, go to www.smokefree.gov or call 1-800-QUIT-NOW. Be physically active- Exercise 5 days a week for at least 30 minutes.  If you are not already physically active start slow and gradually work up to 30 minutes of moderate physical activity.  Examples of moderate activity include walking briskly,  mowing the yard, dancing, swimming bicycling, etc. Eat a healthy diet- Eat a variety of healthy foods such as fruits, vegetables, low fat milk, low fat cheese, yogurt, lean meats, poultry, fish, beans, tofu, etc.  For more information on healthy eating, go to www.thenutritionsource.org Drink alcohol in moderation- Limit alcohol intake two drinks or less a day.  Never drink and drive. Dentist- Brush and floss teeth twice daily; visit your dentis twice a year. Depression-Your emotional health is as important as your physical health.  If you're feeling down, losing interest in things you normally enjoy please talk with your healthcare provider. Gun Safety- If you keep a gun in your home, keep it unloaded and with the safety lock on.  Bullets should be stored separately. Helmet use- Always wear a helmet when riding a motorcycle, bicycle, rollerblading or skateboarding. Safe sex- If you may be exposed to a sexually transmitted infection, use a condom Seat belts- Seat bels can save your life; always wear one. Smoke/Carbon Monoxide detectors- These detectors need to be installed on the appropriate level of your home.  Replace batteries at least once a year. Skin Cancer- When out in the sun, cover up and use sunscreen SPF 15 or higher. Violence- If anyone is threatening or hurting you, please tell your healthcare provider.

## 2021-04-27 NOTE — Progress Notes (Signed)
Anesthesia Chart Review   Case: [id number] Date/Time: 05/08/21 0700   Procedure: TOTAL KNEE ARTHROPLASTY (Right: Knee)   Anesthesia type: Spinal   Pre-op diagnosis: Osteoarthritis of right knee M17.11   Location: [facility] ROOM 07 / [facility]   Surgeons: Vickey Huger, MD       DISCUSSION:70 y.o. former smoker with h/o PONV, GERD, HTN, sleep apnea, CHF, BSCi single chamber ICD implanted 12/22/2009 (device orders in 05/12/2021 progress note), stroke, breast cancer, right knee OA scheduled for above procedure 05/08/2021 with Dr. Vickey Huger.   Per cardiology preoperative evaluation 04/25/2021, "Chart reviewed as part of pre-operative protocol coverage. Given past medical history and time since last visit, based on ACC/AHA guidelines, [name] would be at acceptable risk for the planned procedure without further cardiovascular testing.    Her RCRI is class III risk, 6.6% risk of major cardiac event.   Her aspirin may be held for 5-7 days prior to her procedure.  Please resume as soon as hemostasis is achieved."  Anticipate pt can proceed with planned procedure barring acute status change.   VS: BP 138/66   Pulse 63   Temp 36.7 C (Oral)   Resp 18   Ht 5' 6.5" (1.689 m)   Wt 97.6 kg   LMP  (LMP Unknown)   SpO2 99%   BMI 34.22 kg/m   PROVIDERS: Camillia Herter, NP is PCP   Primary Cardiologist: Larae Grooms, MD LABS: Labs reviewed: Acceptable for surgery. (all labs ordered are listed, but only abnormal results are displayed)  Labs Reviewed  CBC WITH DIFFERENTIAL/PLATELET - Abnormal; Notable for the following components:      Result Value   WBC 3.6 (*)    All other components within normal limits  COMPREHENSIVE METABOLIC PANEL - Abnormal; Notable for the following components:   Anion gap 3 (*)    All other components within normal limits  SURGICAL PCR SCREEN     IMAGES:   EKG: 04/26/2021 Rate 53 bpm  Sinus bradycardia Minimal voltage criteria for LVH, may be  normal variant  CV: Stress Test 04/13/2020 The left ventricular ejection fraction is mildly decreased (45-54%). Nuclear stress EF: 50%. There was no ST segment deviation noted during stress. The perfusion study is normal. This is a low risk study.  Echo 05/13/2017 - Left ventricle: The cavity size was normal. Wall thickness was    increased in a pattern of mild LVH. Systolic function was normal.    The estimated ejection fraction was in the range of 55% to 60%.    Wall motion was normal; there were no regional wall motion    abnormalities. Doppler parameters are consistent with abnormal    left ventricular relaxation (grade 1 diastolic dysfunction).  - Mitral valve: Mildly thickened leaflets . Valve area by pressure    half-time: 2.32 cm^2.  - Left atrium: Volume/bsa, ES (1-plane Simpson&'s, A4C): 33.2    ml/m^2.  - Right ventricle: Pacer wire or catheter noted in right ventricle.  - Right atrium: The atrium was mildly dilated.  - Tricuspid valve: There was mild regurgitation.  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   Bronchiectasis ([place])    Cerebral aneurysm, nonruptured    had a clip put in   CHF (congestive heart failure) (HCC)    Clostridium  difficile infection    Depressive disorder, not elsewhere classified    Diverticulosis of colon (without mention of hemorrhage)    Esophageal reflux    Fatty liver    Fibromyalgia    Gastritis    GERD (gastroesophageal reflux disease)    GI bleed 2004   Glaucoma    Hiatal hernia    Hyperlipidemia    Hypertension    Hypothyroidism    Internal hemorrhoids    Joint pain    Obstructive sleep apnea (adult) (pediatric)    Osteoarthritis    Ostium secundum type atrial septal defect    Other chronic nonalcoholic liver disease    Other pulmonary embolism and infarction     Palpitations    Paroxysmal ventricular tachycardia (HCC)    Personal history of radiation therapy    PONV (postoperative nausea and vomiting)    Presence of permanent cardiac pacemaker    PUD (peptic ulcer disease)    Radiation 02/03/15-03/10/15   Right Breast   Sarcoid    per pt , not sure   Schatzki's ring    Shortness of breath    Sleep apnea    Stroke [facility]) 2013   tia/ pt feels it was around 2008 0r 2009   Takotsubo syndrome    Tubular adenoma of colon    Unspecified transient cerebral ischemia    Unspecified vitamin D deficiency     Past Surgical History:  Procedure Laterality Date   ABI  2006   normal   BREAST EXCISIONAL BIOPSY     BREAST LUMPECTOMY WITH RADIOACTIVE SEED LOCALIZATION Right 01/03/2015   Procedure: BREAST LUMPECTOMY WITH RADIOACTIVE SEED LOCALIZATION;  Surgeon: Autumn Messing III, MD;  Location: [facility] OR;  Service: General;  Laterality: Right;   CARDIAC DEFIBRILLATOR PLACEMENT  2006; 2012   BSX single chamber ICD   carotid dopplers  2006   neg   CEREBRAL ANEURYSM REPAIR  02/1999   COLONOSCOPY     HAMMER TOE SURGERY  11/15/2020   3rd digit bilateral feet    hospitalization  2004   GI bleed, PUD, diverticulosis (EGD,colonscopy)   hospitalization     PE, NSVT, s/p defib   KNEE ARTHROSCOPY     bilateral   LEFT HEART CATHETERIZATION WITH CORONARY ANGIOGRAM N/A 02/22/2012   Procedure: LEFT HEART CATHETERIZATION WITH CORONARY ANGIOGRAM;  Surgeon: Burnell Blanks, MD;  Location: [facility] CATH LAB;  Service: Cardiovascular;  Laterality: N/A;   PARTIAL HYSTERECTOMY     Fibroids   TONGUE BIOPSY  12/12/2017   due to sore tongue and white patches/abnormal cells   TOTAL KNEE ARTHROPLASTY Left 02/13/2016   Procedure: TOTAL KNEE ARTHROPLASTY;  Surgeon: Vickey Huger, MD;  Location: [facility];  Service: Orthopedics;  Laterality: Left;   UPPER GASTROINTESTINAL ENDOSCOPY      MEDICATIONS:  acetaminophen (TYLENOL) 500 MG tablet   albuterol (PROVENTIL) (2.5 MG/3ML) 0.083% nebulizer  solution   albuterol (VENTOLIN HFA) 108 (90 Base) MCG/ACT inhaler   Ascorbic Acid (VITAMIN C) 1000 MG tablet   aspirin 81 MG tablet   baclofen (LIORESAL) 10 MG tablet   calcium carbonate (OS-CAL - DOSED IN MG OF ELEMENTAL CALCIUM) 1250 (500 Ca) MG tablet   carvedilol (COREG) 12.5 MG tablet   Cholecalciferol (DIALYVITE VITAMIN D 5000) 125 MCG (5000 UT) capsule   diclofenac Sodium (VOLTAREN) 1 % GEL   fexofenadine (ALLEGRA) 180 MG tablet   fluticasone (FLONASE) 50 MCG/ACT nasal spray   furosemide (LASIX) 40 MG tablet   hydroxypropyl methylcellulose / hypromellose (  ISOPTO TEARS / GONIOVISC) 2.5 % ophthalmic solution   latanoprost (XALATAN) 0.005 % ophthalmic solution   levothyroxine (SYNTHROID) 50 MCG tablet   magnesium oxide (MAG-OX) 400 MG tablet   mometasone (ELOCON) 0.1 % lotion   montelukast (SINGULAIR) 10 MG tablet   Multiple Vitamins-Minerals (MULTIVITAMIN PO)   nitroGLYCERIN (NITROSTAT) 0.4 MG SL tablet   pantoprazole (PROTONIX) 40 MG tablet   polyethylene glycol powder (GLYCOLAX/MIRALAX) powder   potassium chloride (KLOR-CON) 10 MEQ tablet   Respiratory Therapy Supplies (FLUTTER) DEVI   sodium chloride HYPERTONIC 3 % nebulizer solution   traMADol (ULTRAM) 50 MG tablet   traZODone (DESYREL) 50 MG tablet   zolpidem (AMBIEN) 5 MG tablet   No current facility-administered medications for this encounter.    Adriana Felix Ward, PA-C [facility]-Surgical Testing [phone]

## 2021-04-27 NOTE — Progress Notes (Signed)
Pt presents to establish care, has no concerns desires flu vaccination, request refill on [place]

## 2021-05-01 ENCOUNTER — Other Ambulatory Visit: Payer: Medicare Other | Admitting: *Deleted

## 2021-05-01 ENCOUNTER — Other Ambulatory Visit: Payer: Self-pay

## 2021-05-01 DIAGNOSIS — R748 Abnormal levels of other serum enzymes: Secondary | ICD-10-CM

## 2021-05-01 LAB — CK: Total CK: 234 U/L — ABNORMAL HIGH (ref 32–182)

## 2021-05-02 ENCOUNTER — Telehealth: Payer: Self-pay | Admitting: Pharmacist

## 2021-05-02 MED ORDER — NEXLIZET 180-10 MG PO TABS: 1.0000 | ORAL_TABLET | Freq: Every day | ORAL | 11 refills | Status: DC

## 2021-05-02 NOTE — Telephone Encounter (Signed)
CK trending down but slowly. Has been off of statin for 3 weeks at this point. Pt reports feeling well, has an occasional muscle ache but nothing like when she was on simvastatin. Is willing to start on Nexlizet after she has her upcoming surgery next week. Will call pt in 1 month for tolerability f/u and to schedule labs.

## 2021-05-03 ENCOUNTER — Telehealth: Payer: Self-pay | Admitting: Interventional Cardiology

## 2021-05-03 MED ORDER — EZETIMIBE 10 MG PO TABS: 10.0000 mg | ORAL_TABLET | Freq: Every day | ORAL | 11 refills | Status: DC

## 2021-05-03 NOTE — Telephone Encounter (Signed)
Copay should be $45/month for Tier 3 med. She is not on any branded meds so she should not be in the donut hole. Looks like she has a $480 deductible on meds Tier 3 and up which is the issue. Same issue with PCSK9i and pt is intolerant to 3 statins already.  Can start ezetimibe instead which will not bring her LDL to goal but will be more affordable. Pt prefers to try this. I advised her to look for a Part D plan next year that does not have a deductible so that Nexlizet could be revisited if more affordable.

## 2021-05-03 NOTE — Telephone Encounter (Signed)
Pt c/o medication issue:  1. Name of Medication: Bempedoic Acid-Ezetimibe (NEXLIZET) 180-10 MG TABS  2. How are you currently taking this medication (dosage and times per day)? Patient has not started taking it yet   3. Are you having a reaction (difficulty breathing--STAT)?   4. What is your medication issue? Cost  Patient said this medication would cost her $411 a month. It is a Tier 3 drug. She wanted to know if there was a cheaper alternative to this drug

## 2021-05-04 ENCOUNTER — Other Ambulatory Visit: Payer: Self-pay | Admitting: Orthopedic Surgery

## 2021-05-04 ENCOUNTER — Other Ambulatory Visit: Payer: Self-pay | Admitting: Physician Assistant

## 2021-05-04 ENCOUNTER — Telehealth: Payer: Self-pay | Admitting: Pulmonary Disease

## 2021-05-04 DIAGNOSIS — Z8616 Personal history of COVID-19: Secondary | ICD-10-CM

## 2021-05-04 HISTORY — DX: Personal history of COVID-19: Z86.16

## 2021-05-04 LAB — SARS CORONAVIRUS 2 (TAT 6-24 HRS): SARS Coronavirus 2: POSITIVE — AB

## 2021-05-04 MED ORDER — NIRMATRELVIR/RITONAVIR (PAXLOVID)TABLET: 3.0000 | ORAL_TABLET | Freq: Two times a day (BID) | ORAL | 0 refills | Status: AC

## 2021-05-04 MED ORDER — NIRMATRELVIR/RITONAVIR (PAXLOVID)TABLET: 3.0000 | ORAL_TABLET | Freq: Two times a day (BID) | ORAL | 0 refills | Status: DC

## 2021-05-04 NOTE — Telephone Encounter (Signed)
Normal creatinine from 04/26/21.  Okay to send script for paxlovid.  Please schedule video visit with me or NP next week.

## 2021-05-04 NOTE — Telephone Encounter (Signed)
On call- Pharmacist asked because eof age and GFR 59.8- I advised give renal dosing on paxlovid.

## 2021-05-04 NOTE — Telephone Encounter (Signed)
Call returned to patient, confirmed DOB. Patient made aware of VS recommendations. Medication sent. Patient now stating she was going to have her knee replacement surgery however now that she has covid her surgery was cancelled. She is wanting to know if she needs to re-test who does she need to contact. I made her aware she should f/u with there provider who needed her to get the test. Patient insists she needs to F/U with VS stating he is my lung doctor I should do it through him. I made him aware I would pass the message along however he will not if she needs to be retested prior to her knee surgery since he is not the one doing the surgery.Patient insisted we ask VS anyway.   VS please advise. Thanks :)

## 2021-05-04 NOTE — Telephone Encounter (Signed)
Sent 05/04/21

## 2021-05-04 NOTE — Telephone Encounter (Signed)
Report she tested positive for covid today. She reports a slight headache. Denies all other symptoms. Denies fevers. Temp 98.1. She is requesting the paxlovid I made here aware that the provider makes that recommendation. She thought she would automatically get it since she has underlying health concerns. I again inquired if she was having any other symptoms and she reports only the headache.    VS please advise. Thanks

## 2021-05-06 ENCOUNTER — Encounter: Payer: Self-pay | Admitting: Family

## 2021-05-08 ENCOUNTER — Encounter (INDEPENDENT_AMBULATORY_CARE_PROVIDER_SITE_OTHER): Payer: Self-pay | Admitting: Family Medicine

## 2021-05-08 NOTE — Telephone Encounter (Signed)
Pt last seen by Dr. Wallace.  

## 2021-05-10 NOTE — Telephone Encounter (Signed)
VS please advise. Thanks   Paxlovid caused me to itch.  I completed the prescription on Tuesday morning and also took Benadryl. It also gave me hot flashes

## 2021-05-11 ENCOUNTER — Other Ambulatory Visit: Payer: Self-pay

## 2021-05-11 ENCOUNTER — Ambulatory Visit
Admission: EM | Admit: 2021-05-11 | Discharge: 2021-05-11 | Disposition: A | Discharge status: Home / Self Care | Payer: Medicare Other | Attending: Urgent Care | Admitting: Urgent Care

## 2021-05-11 ENCOUNTER — Ambulatory Visit: Payer: Medicare Other

## 2021-05-11 DIAGNOSIS — R070 Pain in throat: Secondary | ICD-10-CM

## 2021-05-11 DIAGNOSIS — U071 COVID-19: Secondary | ICD-10-CM | POA: Diagnosis not present

## 2021-05-11 DIAGNOSIS — L299 Pruritus, unspecified: Secondary | ICD-10-CM

## 2021-05-11 MED ORDER — CHLORASEPTIC 1.4 % MT LIQD: 1.0000 | Freq: Three times a day (TID) | OROMUCOSAL | 0 refills | Status: DC | PRN

## 2021-05-11 MED ORDER — HYDROXYZINE HCL 25 MG PO TABS: 12.5000 mg | ORAL_TABLET | Freq: Three times a day (TID) | ORAL | 0 refills | Status: DC | PRN

## 2021-05-11 NOTE — ED Provider Notes (Signed)
[place]   MRN: [id number] DOB: 28-Oct-1950  Subjective:   [name] is a 70 y.o. female presenting for 1 day history of throat discomfort but not pain.  Feels like she had some white spots and wants to know if she should be on an antibiotic.  Tested positive for COVID-19 last week from doing a general screening prior to surgery.  She was asymptomatic.  Patient was prescribed Paxlovid, was starting to have itching with the medication and therefore was redirected to stop the medication and take benadryl.  The patient had already finished the course.  Denies fever, headache, confusion, cough, chest pain, shortness of breath, wheezing, nausea, vomiting, abdominal pain.  Patient has an extensive heart history.  She has pacemaker status.  No current facility-administered medications for this encounter.  Current Outpatient Medications:    acetaminophen (TYLENOL) 500 MG tablet, Take 500 mg by mouth every 6 (six) hours as needed for moderate pain., Disp: , Rfl:    albuterol (PROVENTIL) (2.5 MG/3ML) 0.083% nebulizer solution, USE 1 VIAL IN NEBULIZER EVERY 6 HOURS (Patient taking differently: Take 2.5 mg by nebulization every 6 (six) hours as needed for shortness of breath.), Disp: 120 mL, Rfl: 11   albuterol (VENTOLIN HFA) 108 (90 Base) MCG/ACT inhaler, Inhale 2 puffs into the lungs every 6 (six) hours as needed for wheezing or shortness of breath., Disp: 18 g, Rfl: 3   Ascorbic Acid (VITAMIN C) 1000 MG tablet, Take 1,000 mg by mouth daily., Disp: , Rfl:    aspirin 81 MG tablet, Take 81 mg by mouth daily., Disp: , Rfl:    baclofen (LIORESAL) 10 MG tablet, TAKE 1 TABLET BY MOUTH AT BEDTIME AS NEEDED FOR MUSCLE SPASM, Disp: 30 tablet, Rfl: 2   calcium carbonate (OS-CAL - DOSED IN MG OF ELEMENTAL CALCIUM) 1250 (500 Ca) MG tablet, Take 1 tablet by mouth daily., Disp: , Rfl:    carvedilol (COREG) 12.5 MG tablet, Take 1 tablet (12.5 mg total) by mouth 2 (two) times daily., Disp:  180 tablet, Rfl: 3   Cholecalciferol (DIALYVITE VITAMIN D 5000) 125 MCG (5000 UT) capsule, Take 5,000 Units by mouth daily., Disp: , Rfl:    diclofenac Sodium (VOLTAREN) 1 % GEL, Apply 2-4 grams to affected joint 4 times daily as needed., Disp: 400 g, Rfl: 2   ezetimibe (ZETIA) 10 MG tablet, Take 1 tablet (10 mg total) by mouth daily., Disp: 30 tablet, Rfl: 11   fexofenadine (ALLEGRA) 180 MG tablet, Take 180 mg by mouth daily., Disp: , Rfl:    fluticasone (FLONASE) 50 MCG/ACT nasal spray, Place 2 sprays into both nostrils daily as needed for allergies or rhinitis. (Patient taking differently: Place 2 sprays into both nostrils daily.), Disp: 48 g, Rfl: 3   furosemide (LASIX) 40 MG tablet, Take 20-40 mg by mouth as needed for edema., Disp: , Rfl:    hydroxypropyl methylcellulose / hypromellose (ISOPTO TEARS / GONIOVISC) 2.5 % ophthalmic solution, Place 1 drop into both eyes at bedtime., Disp: , Rfl:    latanoprost (XALATAN) 0.005 % ophthalmic solution, Place 1 drop into both eyes at bedtime., Disp: , Rfl:    levothyroxine (SYNTHROID) 50 MCG tablet, Take 1 tablet (50 mcg total) by mouth daily., Disp: 90 tablet, Rfl: 1   magnesium oxide (MAG-OX) 400 MG tablet, Take 1 tablet (400 mg total) by mouth in the morning, at noon, and at bedtime. (Patient taking differently: Take 400 mg by mouth 2 (two) times daily.), Disp: 270 tablet, Rfl: 3  mometasone (ELOCON) 0.1 % lotion, Apply 1 application topically daily as needed (rash)., Disp: 60 mL, Rfl: 1   montelukast (SINGULAIR) 10 MG tablet, Take 1 tablet (10 mg total) by mouth at bedtime., Disp: 90 tablet, Rfl: 3   Multiple Vitamins-Minerals (MULTIVITAMIN PO), Take 1 tablet by mouth daily., Disp: , Rfl:    nitroGLYCERIN (NITROSTAT) 0.4 MG SL tablet, DISSOLVE ONE TABLET UNDER THE TONGUE EVERY 5 MINUTES AS NEEDED FOR CHEST PAIN.  DO NOT EXCEED A TOTAL OF 3 DOSES IN 15 MINUTES, Disp: 25 tablet, Rfl: 0   pantoprazole (PROTONIX) 40 MG tablet, Take 1 tablet (40 mg  total) by mouth daily. (Patient taking differently: Take 40 mg by mouth See admin instructions. Take 40 mg daily, may take a second 40 mg dose as needed for acid reflux), Disp: 30 tablet, Rfl: 11   polyethylene glycol powder (GLYCOLAX/MIRALAX) powder, Take 17 grams by mouth daily, Disp: 1080 g, Rfl: 0   potassium chloride (KLOR-CON) 10 MEQ tablet, TAKE 1/2 TABLET EVERY DAY (Patient taking differently: Take 5 mEq by mouth daily as needed (when taking lasix).), Disp: 15 tablet, Rfl: 0   Respiratory Therapy Supplies (FLUTTER) DEVI, 1 Device by Does not apply route as needed., Disp: 1 each, Rfl: 0   sodium chloride HYPERTONIC 3 % nebulizer solution, Take by nebulization 2 (two) times daily as needed for cough. 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, Disp: 750 mL, Rfl: 12   traMADol (ULTRAM) 50 MG tablet, Take 1 tablet (50 mg total) by mouth 2 (two) times daily. (Patient taking differently: Take 50 mg by mouth 2 (two) times daily as needed for moderate pain.), Disp: 180 tablet, Rfl: 1   traZODone (DESYREL) 50 MG tablet, Take 0.5-1 tablets (25-50 mg total) by mouth at bedtime as needed for sleep. (Patient taking differently: Take 50 mg by mouth at bedtime as needed for sleep.), Disp: 90 tablet, Rfl: 0   zolpidem (AMBIEN) 5 MG tablet, Take 1 tablet (5 mg total) by mouth at bedtime as needed for sleep. (Patient taking differently: Take 5 mg by mouth at bedtime.), Disp: 90 tablet, Rfl: 0   Allergies  Allergen Reactions   Ace Inhibitors Swelling    Angioedema; makes tongue "break out"    Amitriptyline Hcl Other (See Comments)     makes her too sleepy!   Atorvastatin Other (See Comments)    SEVERE MYALGIA   Cymbalta [Duloxetine Hcl] Nausea Only and Other (See Comments)    Sleepiness/ sick   Dilantin [Phenytoin Sodium Extended] Rash    Severe rash   Paroxetine Nausea Only    Rapid heartbeat   Ramipril Other (See Comments)    TONGUE ULCERS    Rosuvastatin Other (See Comments)    SEVERE MYALGIA   Simvastatin      Increase in CK, myalgias   Carbamazepine Rash   Codeine Itching   Iodine-131 Other (See Comments)    Swelling at IV site only, no swelling or SOB around mouth.!   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   Bronchiectasis ([place])    Cerebral aneurysm, nonruptured    had a clip put in   CHF (congestive heart failure) (HCC)    Clostridium difficile infection    Depressive disorder, not elsewhere classified  Diverticulosis of colon (without mention of hemorrhage)    Esophageal reflux    Fatty liver    Fibromyalgia    Gastritis    GERD (gastroesophageal reflux disease)    GI bleed 2004   Glaucoma    Hiatal hernia    Hyperlipidemia    Hypertension    Hypothyroidism    Internal hemorrhoids    Joint pain    Obstructive sleep apnea (adult) (pediatric)    Osteoarthritis    Ostium secundum type atrial septal defect    Other chronic nonalcoholic liver disease    Other pulmonary embolism and infarction    Palpitations    Paroxysmal ventricular tachycardia (HCC)    Personal history of radiation therapy    PONV (postoperative nausea and vomiting)    Presence of permanent cardiac pacemaker    PUD (peptic ulcer disease)    Radiation 02/03/15-03/10/15   Right Breast   Sarcoid    per pt , not sure   Schatzki's ring    Shortness of breath    Sleep apnea    Stroke [facility]) 2013   tia/ pt feels it was around 2008 0r 2009   Takotsubo syndrome    Tubular adenoma of colon    Unspecified transient cerebral ischemia    Unspecified vitamin D deficiency      Past Surgical History:  Procedure Laterality Date   ABI  2006   normal   BREAST EXCISIONAL BIOPSY     BREAST LUMPECTOMY WITH RADIOACTIVE SEED LOCALIZATION Right 01/03/2015   Procedure: BREAST LUMPECTOMY  WITH RADIOACTIVE SEED LOCALIZATION;  Surgeon: Autumn Messing III, MD;  Location: [facility];  Service: General;  Laterality: Right;   CARDIAC DEFIBRILLATOR PLACEMENT  2006; 2012   BSX single chamber ICD   carotid dopplers  2006   neg   CEREBRAL ANEURYSM REPAIR  02/1999   COLONOSCOPY     HAMMER TOE SURGERY  11/15/2020   3rd digit bilateral feet    hospitalization  2004   GI bleed, PUD, diverticulosis (EGD,colonscopy)   hospitalization     PE, NSVT, s/p defib   KNEE ARTHROSCOPY     bilateral   LEFT HEART CATHETERIZATION WITH CORONARY ANGIOGRAM N/A 02/22/2012   Procedure: LEFT HEART CATHETERIZATION WITH CORONARY ANGIOGRAM;  Surgeon: Burnell Blanks, MD;  Location: [facility] CATH LAB;  Service: Cardiovascular;  Laterality: N/A;   PARTIAL HYSTERECTOMY     Fibroids   TONGUE BIOPSY  12/12/2017   due to sore tongue and white patches/abnormal cells   TOTAL KNEE ARTHROPLASTY Left 02/13/2016   Procedure: TOTAL KNEE ARTHROPLASTY;  Surgeon: Vickey Huger, MD;  Location: [facility];  Service: Orthopedics;  Laterality: Left;   UPPER GASTROINTESTINAL ENDOSCOPY      Family History  Problem Relation Age of Onset   Diabetes Mother    Alzheimer's disease Mother    Hypertension Mother    Obesity Mother    Other Brother        Thyroid problem 10/2016   Allergies Sister    Heart disease Sister    Heart disease Brother    Prostate cancer Brother 36       same brother as throat cancer   Throat cancer Brother        dx in his 70s; also a smoker   Heart disease Father    Cancer Maternal Grandmother 60       colon cancer or abdominal cancer   Lung cancer Maternal Grandfather 78   Breast cancer Maternal Aunt 55  Colon cancer Maternal Aunt 66       same sister as breast at 68   Lung cancer Maternal Uncle    Breast cancer Maternal Aunt        dx in her 79s   Pancreatic cancer Cousin 50       maternal first cousin   Breast cancer Cousin        paternal first cousin twice removed died in her 66s   Heart disease Son         Cardiac Arrest 07/2016   Stroke Neg Hx     Social History   Tobacco Use   Smoking status: Former    Packs/day: 1.00    Years: 15.00    Pack years: 15.00    Types: Cigarettes    Quit date: 08/20/1989    Years since quitting: 31.7   Smokeless tobacco: Never   Tobacco comments:    Started at 2; less than 1 PPD  Vaping Use   Vaping Use: Never used  Substance Use Topics   Alcohol use: Yes    Comment: occas   Drug use: Not Currently    Types: Marijuana    Comment: marijuana candy last used 2 months ago    ROS   Objective:   Vitals: BP (!) 146/84 (BP Location: Left Arm)   Pulse 65   Temp 98 F (36.7 C) (Oral)   Resp 18   LMP  (LMP Unknown)   SpO2 97%   Physical Exam Constitutional:      General: She is not in acute distress.    Appearance: Normal appearance. She is well-developed. She is not ill-appearing, toxic-appearing or diaphoretic.  HENT:     Head: Normocephalic and atraumatic.     Right Ear: Tympanic membrane and ear canal normal. No drainage or tenderness. No middle ear effusion. Tympanic membrane is not erythematous.     Left Ear: Tympanic membrane and ear canal normal. No drainage or tenderness.  No middle ear effusion. Tympanic membrane is not erythematous.     Nose: Nose normal. No congestion or rhinorrhea.     Mouth/Throat:     Mouth: Mucous membranes are moist. No oral lesions.     Pharynx: No pharyngeal swelling, oropharyngeal exudate, posterior oropharyngeal erythema or uvula swelling.     Tonsils: No tonsillar exudate or tonsillar abscesses.     Comments: Slight postnasal drainage overlying pharynx. Eyes:     General: No scleral icterus.    Extraocular Movements: Extraocular movements intact.     Right eye: Normal extraocular motion.     Left eye: Normal extraocular motion.     Conjunctiva/sclera: Conjunctivae normal.     Pupils: Pupils are equal, round, and reactive to light.  Cardiovascular:     Rate and Rhythm: Normal rate and regular  rhythm.     Pulses: Normal pulses.     Heart sounds: Normal heart sounds. No murmur heard.   No friction rub. No gallop.  Pulmonary:     Effort: Pulmonary effort is normal. No respiratory distress.     Breath sounds: Normal breath sounds. No stridor. No wheezing, rhonchi or rales.  Musculoskeletal:     Cervical back: Normal range of motion and neck supple.  Lymphadenopathy:     Cervical: No cervical adenopathy.  Skin:    General: Skin is warm and dry.     Findings: No rash.  Neurological:     General: No focal deficit present.     Mental Status: She  is alert and oriented to person, place, and time.  Psychiatric:        Mood and Affect: Mood normal.        Behavior: Behavior normal.        Thought Content: Thought content normal.     Assessment and Plan :   PDMP not reviewed this encounter.  1. COVID-19   2. Itching   3. Throat discomfort     Recommended conservative management with supportive care for COVID-19.  No signs of bacterial infection.  Recommended restarting her Allegra.  Follow-up with PCP as soon as possible.  Patient has clear cardiopulmonary exam and no respiratory symptoms, therefore deferred imaging.  Counseled patient on potential for adverse effects with medications prescribed/recommended today, ER and return-to-clinic precautions discussed, patient verbalized understanding.    Jaynee Eagles, [place] 05/11/21 1415

## 2021-05-11 NOTE — ED Triage Notes (Signed)
Pt c/o sore throat onset last night. Said she was asymptomatic and tested covid(+) for a pre-surgery visit. States she is here for symptom relief. States she was on paxlovid finished last Monday but has been itching since she started tx.   Denies cough, headache,  nausea, vomitting, diarrhea, constipation.

## 2021-05-11 NOTE — Progress Notes (Deleted)
Cardiology Office Note:    Date:  05/11/2021   ID:  [name], DOB Sep 22, 1950, MRN [id number]  PCP:  Adriana Herter, NP  Cardiologist:  Adriana Grooms, MD  Electrophysiologist:  Adriana Axe, MD   Referring MD: Adriana Herter, NP   Chief Complaint: pre-op evaluation for upcoming total knee replacement  History of Present Illness:    [name] is a 70 y.o. female with a history of normal coronaries on cardiac cath in 2005 and negative Myoview in 03/2020, non-ischemic cardiomyopathy/ chronic systolic CHF with EF as low as 35-40% in 2013 with subsequent normalization of EF to 55-60%, ventricular tachycardia s/p ICD in 12/2009, questionable sarcoidosis, ophthalmic artery aneurysm s/p repair in 2000, prior stroke, obstructive sleep apnea on CPAP, bronchiectasis followed by Dr. Halford [name], hypertension, hyperlipidemia, hypothyroidism, GERD, fibromyalgia followed by Neurology, and anxiety who is followed by Dr. Irish [name] and Dr. Caryl [name] and presents today for pre-op evaluation.   Patient has a long history of non-ischemic cardiomyopathy with EF as low as 35-40% in 2013 but has since normalized. Remote cardiac cath in 2005 showed normal coronaries. There is mention of questionable cardiac sarcoidosis mentioned in piror cardiac notes due to history of pulmonary sarcoidosis. However, reviewed latest notes from Pulmonology which do not mention a diagnosis of pulmonary sarcoidosis. Also do not see mention of cardiac sarcoidosis in Dr. Hassell Done notes. Does not look like patient has ever had a cardiac MRI. Last Echo in 04/2017 showed LVEF of 55-60% with normal wall motion, grade 1 diastolic dysfunction, and mild TR. 1-week Zio Monitor was ordered in 11/2019 for worsening palpitations and showed PVCs and short runs of non-sustained SVT but no serious arrhythmias. Symptoms were associated with infrequent PVCs. Last ischemic evaluation ordered for atypical chest pain was a Myoview in  03/2020 which was low risk and showed no evidence of ischemia.   Patient was last see by Adriana Standard, PA-C, in 12/2020 at which time he was doing well from a cardiac standpoint with only occasional palpitations and stable chronic shortness of breath.   She was seen by her PCP on 04/10/2021 for bilateral leg cramps. CK was checked and was elevated at 594. She was advised to stop her Simvastatin which she had been taken for at least a decade. CK was rechecked 4 days later and had improved to 200. ALT also slightly elevated over the past year but she does have known fatty liver disease. She was referred to our lipid clinic for further management and seen in our PharmD Clinic on 04/19/2021. CK was rechecked at that visit and was up to 266 but was coming back down on recheck on 05/01/2021. Muscle aches had improved that time. Plan is to start Nexlizet after upcoming knee surgery.   Patient presents today for pre-op evaluation for upcoming total knee replacement. ***  Pre-Op Evaluation  - Patient has total knee replacement planned for 05/22/2021.  - Stable from a cardiac standpoint. No chest pain, acute CHF symptoms, or syncope.  - Able to complete >4.0 METS without any angina. *** Per Revised Cardiac Risk Index, considered moderate risk with a 6.6% risk of adverse cardiac events (MI, pulmonary edema, V.Fib, cardiac arrest, or complete heart block) given history of CHF and prior CVA. Therefore, based on ACC/AHA guidelines, patient would be at acceptable risk for the planned procedure without further cardiovascular testing. Patient has no known history of CAD; therefore, OK to hold Aspirin for 5-7 days prior to procedure from a cardiac standpoint with  plans to resume this as soon as safely possible postoperatively. However, given history of CVA would also recommend reaching out to PCP/Neurology for their recommendations. I will route this recommendation to the requesting party via Epic fax function.  ***  Non-Ischemic Cardiomyopathy Chronic Systolic CHF with Improved EF - LVEF as low as 35-40% in 2013 but has since normalized. Last Echo in 2018 showed LVEF of 55-60%. Questionable history of possible sarcoidosis but unclear.  - Appears euvolemic on exam.  - Continue Lasix 20-40mg  daily as needed for weight gain or edema. *** - Continue Coreg 12.5mg  twice daily.  - Not on ACEi/ARB due to possible angioedema on ACEi.  - Discussed importance of daily weights and sodium/fluid restrictions.   History of VT s/p ICD - S/p [employer] ICD in 12/2009. - Last device check in 02/3032 showed no ventricular arrhythmias and no shocks.  - Followed by Dr. Caryl [name].   Hypertension - BP well controlled.  - Continue Coreg as above.  Hyperlipidemia Elevated CK - Last lipid panel in 12/2020: Total Cholesterol 178, Triglycerides 92, HDL 59.30, LDL 100. - LDL goal <70 given prior CVA. - Recently noted to have significant myalgias and found to have elevated CK as high as 594. Simvastatin was stopped and patient has been followed by our PharmD lipid clinic. CK has trended down and plan is to start Nexlizet after upcoming surgery. Currently on Zetia 10mg  daily.  Obstructive Sleep Apnea - Continue CPAP.  Past Medical History:  Diagnosis Date   AICD (automatic cardioverter/defibrillator) present    Anxiety    Arthritis    Back pain    Breast cancer ([place]) 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   Bronchiectasis ([place])    Cerebral aneurysm, nonruptured    had a clip put in   CHF (congestive heart failure) (HCC)    Clostridium difficile infection    Depressive disorder, not elsewhere classified    Diverticulosis of colon (without mention of hemorrhage)    Esophageal reflux    Fatty liver    Fibromyalgia    Gastritis    GERD (gastroesophageal reflux disease)    GI bleed 2004   Glaucoma    Hiatal hernia    Hyperlipidemia    Hypertension    Hypothyroidism    Internal hemorrhoids    Joint  pain    Obstructive sleep apnea (adult) (pediatric)    Osteoarthritis    Ostium secundum type atrial septal defect    Other chronic nonalcoholic liver disease    Other pulmonary embolism and infarction    Palpitations    Paroxysmal ventricular tachycardia (HCC)    Personal history of radiation therapy    PONV (postoperative nausea and vomiting)    Presence of permanent cardiac pacemaker    PUD (peptic ulcer disease)    Radiation 02/03/15-03/10/15   Right Breast   Sarcoid    per pt , not sure   Schatzki's ring    Shortness of breath    Sleep apnea    Stroke [facility]) 2013   tia/ pt feels it was around 2008 0r 2009   Takotsubo syndrome    Tubular adenoma of colon    Unspecified transient cerebral ischemia    Unspecified vitamin D deficiency     Past Surgical History:  Procedure Laterality Date   ABI  2006   normal   BREAST EXCISIONAL BIOPSY     BREAST LUMPECTOMY WITH RADIOACTIVE SEED LOCALIZATION Right 01/03/2015   Procedure: BREAST LUMPECTOMY WITH  RADIOACTIVE SEED LOCALIZATION;  Surgeon: Autumn Messing III, MD;  Location: [facility];  Service: General;  Laterality: Right;   CARDIAC DEFIBRILLATOR PLACEMENT  2006; 2012   BSX single chamber ICD   carotid dopplers  2006   neg   CEREBRAL ANEURYSM REPAIR  02/1999   COLONOSCOPY     HAMMER TOE SURGERY  11/15/2020   3rd digit bilateral feet    hospitalization  2004   GI bleed, PUD, diverticulosis (EGD,colonscopy)   hospitalization     PE, NSVT, s/p defib   KNEE ARTHROSCOPY     bilateral   LEFT HEART CATHETERIZATION WITH CORONARY ANGIOGRAM N/A 02/22/2012   Procedure: LEFT HEART CATHETERIZATION WITH CORONARY ANGIOGRAM;  Surgeon: Burnell Blanks, MD;  Location[facility] CATH LAB;  Service: Cardiovascular;  Laterality: N/A;   PARTIAL HYSTERECTOMY     Fibroids   TONGUE BIOPSY  12/12/2017   due to sore tongue and white patches/abnormal cells   TOTAL KNEE ARTHROPLASTY Left 02/13/2016   Procedure: TOTAL KNEE ARTHROPLASTY;  Surgeon: Vickey Huger, MD;   Location: [facility];  Service: Orthopedics;  Laterality: Left;   UPPER GASTROINTESTINAL ENDOSCOPY      Current Medications: No outpatient medications have been marked as taking for the 05/12/21 encounter (Appointment) with Darreld Mclean, PA-C.     Allergies:   Ace inhibitors, Amitriptyline hcl, Atorvastatin, Cymbalta [duloxetine hcl], Dilantin [phenytoin sodium extended], Paroxetine, Ramipril, Rosuvastatin, Simvastatin, Carbamazepine, Codeine, Iodine-131, Phenytoin sodium extended, and Wellbutrin [bupropion]   Social History   Socioeconomic History   Marital status: Divorced    Spouse name: Not on file   Number of children: 2   Years of education: 14   Highest education level: Not on file  Occupational History   Occupation: DISABLED  Tobacco Use   Smoking status: Former    Packs/day: 1.00    Years: 15.00    Pack years: 15.00    Types: Cigarettes    Quit date: 08/20/1989    Years since quitting: 31.7   Smokeless tobacco: Never   Tobacco comments:    Started at 45; less than 1 PPD  Vaping Use   Vaping Use: Never used  Substance and Sexual Activity   Alcohol use: Yes    Comment: occas   Drug use: Not Currently    Types: Marijuana    Comment: marijuana candy last used 2 months ago   Sexual activity: Not Currently  Other Topics Concern   Not on file  Social History Narrative   Lives alone   Caffeine CLE:XNTZ   Retired from [employer]   2 children -- 5 grandbabies   Live alone   Social Determinants of Radio broadcast assistant Strain: Low Risk    Difficulty of Paying Living Expenses: Not hard at all  Food Insecurity: No Food Insecurity   Worried About Charity fundraiser in the Last Year: Never true   Arboriculturist in the Last Year: Never true  Transportation Needs: No Transportation Needs   [name] of Transportation (Medical): No   [name] of Transportation (Non-Medical): No  Physical Activity: Inactive   Days of Exercise per Week: 0 days   Minutes of Exercise per  Session: 0 min  Stress: No Stress Concern Present   Feeling of Stress : Not at all  Social Connections: Moderately Isolated   Frequency of Communication with Friends and Family: More than three times a week   Frequency of Social Gatherings with Friends and Family: More than three times  a week   Attends Religious Services: 1 to 4 times per year   Active Member of Clubs or Organizations: No   Attends Archivist Meetings: Never   Marital Status: Divorced     Family History: The patient's family history includes Allergies in her sister; Alzheimer's disease in her mother; Breast cancer in her cousin and maternal aunt; Breast cancer (age of onset: 58) in her maternal aunt; Cancer (age of onset: 25) in her maternal grandmother; Colon cancer (age of onset: 49) in her maternal aunt; Diabetes in her mother; Heart disease in her brother, father, sister, and son; Hypertension in her mother; Lung cancer in her maternal uncle; Lung cancer (age of onset: 51) in her maternal grandfather; Obesity in her mother; Other in her brother; Pancreatic cancer (age of onset: 84) in her cousin; Prostate cancer (age of onset: 68) in her brother; Throat cancer in her brother. There is no history of Stroke.  ROS:   Please see the history of present illness.     EKGs/Labs/Other Studies Reviewed:    The following studies were reviewed today:  Echocardiogram 05/13/2017: Study Conclusions: - Left ventricle: The cavity size was normal. Wall thickness was    increased in a pattern of mild LVH. Systolic function was normal.    The estimated ejection fraction was in the range of 55% to 60%.    Wall motion was normal; there were no regional wall motion    abnormalities. Doppler parameters are consistent with abnormal    left ventricular relaxation (grade 1 diastolic dysfunction).  - Mitral valve: Mildly thickened leaflets . Valve area by pressure    half-time: 2.32 cm^2.  - Left atrium: Volume/bsa, ES (1-plane  Simpson&'s, A4C): 33.2    ml/m^2.  - Right ventricle: Pacer wire or catheter noted in right ventricle.  - Right atrium: The atrium was mildly dilated.  - Tricuspid valve: There was mild regurgitation.   Impressions: - Compared to the prior study, there has been no significant    interval change. _______________  Elwyn Reach Monitor 11/2019: Duration: 7 days Findings: Symptoms of flutters assoc mostly with PVCs  Symptoms of flutters also assoc with sinus S VT Nonsustained 6 episodes; fastest 141 bpm for 6 beats; longest for 7 beats at 100 bpm   No serious arrhythmia Symptoms assoc with infrequent PVCs  _______________  Myoview 04/13/2020: The left ventricular ejection fraction is mildly decreased (45-54%). Nuclear stress EF: 50%. There was no ST segment deviation noted during stress. The perfusion study is normal. This is a low risk study.  EKG:  EKG ordered today. EKG personally reviewed and demonstrates ***.  Recent Labs: 04/10/2021: Magnesium 2.2; TSH 1.55 04/26/2021: ALT 34; BUN 13; Creatinine, Ser 0.81; Hemoglobin 12.4; Platelets 168; Potassium 4.2; Sodium 142  Recent Lipid Panel    Component Value Date/Time   CHOL 178 12/20/2020 1045   CHOL 171 10/01/2019 1408   TRIG 92.0 12/20/2020 1045   HDL 59.30 12/20/2020 1045   HDL 47 10/01/2019 1408   CHOLHDL 3 12/20/2020 1045   VLDL 18.4 12/20/2020 1045   LDLCALC 100 (H) 12/20/2020 1045   LDLCALC 97 10/01/2019 1408   LDLDIRECT 141.8 07/04/2007 0936    Physical Exam:    Vital Signs: LMP  (LMP Unknown)     Wt Readings from Last 3 Encounters:  04/27/21 208 lb 3.2 oz (94.4 kg)  04/26/21 215 lb 4 oz (97.6 kg)  04/10/21 219 lb (99.3 kg)     General: 70 y.o. female in  no acute distress. HEENT: Normocephalic and atraumatic. Sclera clear. EOMs intact. Neck: Supple. No carotid bruits. No JVD. Heart: *** RRR. Distinct S1 and S2. No murmurs, gallops, or rubs. Radial and distal pedal pulses 2+ and equal bilaterally. Lungs: No  increased work of breathing. Clear to ausculation bilaterally. No wheezes, rhonchi, or rales.  Abdomen: Soft, non-distended, and non-tender to palpation. Bowel sounds present in all 4 quadrants.  MSK: Normal strength and tone for age. *** Extremities: No lower extremity edema.    Skin: Warm and dry. Neuro: Alert and oriented x3. No focal deficits. Psych: Normal affect. Responds appropriately.   Assessment:    No diagnosis found.  Plan:     Disposition: Follow up in ***   Medication Adjustments/Labs and Tests Ordered: Current medicines are reviewed at length with the patient today.  Concerns regarding medicines are outlined above.  No orders of the defined types were placed in this encounter.  No orders of the defined types were placed in this encounter.   There are no Patient Instructions on file for this visit.   Signed, Darreld Mclean, PA-C  05/11/2021 9:58 AM    [facility]

## 2021-05-12 ENCOUNTER — Encounter (HOSPITAL_COMMUNITY): Payer: Self-pay | Admitting: Orthopedic Surgery

## 2021-05-12 ENCOUNTER — Telehealth: Payer: Self-pay | Admitting: Interventional Cardiology

## 2021-05-12 ENCOUNTER — Ambulatory Visit: Payer: Medicare Other | Admitting: Student

## 2021-05-12 ENCOUNTER — Encounter: Payer: Self-pay | Admitting: Internal Medicine

## 2021-05-12 NOTE — Telephone Encounter (Signed)
Pt tested positive for covid last week, she is currently off quarenting but she have developed a cough. Pt's PCP would not see her yesterday due to the cough so pt had to go to an Urgent Care. Pt has an appt for today at 3:45pm with Wende Mott wants to know is it safe for her to keep the appt as long as she wears her facemask

## 2021-05-12 NOTE — Progress Notes (Signed)
PERIOPERATIVE PRESCRIPTION FOR IMPLANTED CARDIAC DEVICE PROGRAMMING  Patient Information: Name:  [name]  DOB:  05-24-1951  MRN:  [id number]    Planned Procedure:  RIGHT TOTAL KNEE ARTHROPLASTY  Surgeon:  Dr. Harden Mo  Date of Procedure:  05/22/2021  Cautery will be used.  Position during surgery:  SUPINE   Please send documentation back to:  [facility] (Fax # [phone])  Device Information:  Clinic EP Physician:  Virl Axe, MD   Device Type:  Defibrillator Manufacturer and Phone #:  [employer]ne [employer]: [phone] Pacemaker Dependent?:  No. Date of Last Device Check:  03/01/21 (Remote) Normal Device Function?:  Yes.    Electrophysiologist's Recommendations:  Have magnet available. Provide continuous ECG monitoring when magnet is used or reprogramming is to be performed.  Procedure should not interfere with device function.  No device programming or magnet placement needed.  Per Device Clinic Standing Orders, Simone Curia, RN  2:07 PM 05/12/2021

## 2021-05-12 NOTE — Progress Notes (Signed)
[name] made aware to arrive at [facility] on 05/22/21, Drink ensure at 6:55 AM she verbalized understanding.  She denied any COVID symptoms during quarantine, developed a sore throat and itching after taking Paxlovid.

## 2021-05-13 NOTE — Progress Notes (Addendum)
Cardiology Office Note:    Date:  05/15/2021   ID:  [name], DOB 05-26-1951, MRN [id number]  PCP:  Camillia Herter, NP  Cardiologist:  Larae Grooms, MD  Electrophysiologist:  Virl Axe, MD   Referring MD: Camillia Herter, NP   Chief Complaint: pre-op evaluation  History of Present Illness:    [name] is a 70 y.o. female with a history of normal coronaries on cardiac cath in 2005 and negative Myoview in 03/2020, non-ischemic cardiomyopathy/ chronic systolic CHF with EF as low as 35-40% in 2013 with subsequent normalization of EF to 55-60%, ventricular tachycardia s/p ICD in 12/2009, questionable sarcoidosis, ophthalmic artery aneurysm s/p repair in 2000, prior stroke, obstructive sleep apnea on CPAP, bronchiectasis followed by Dr. Halford Chessman, hypertension, hyperlipidemia, hypothyroidism, GERD, fibromyalgia followed by Neurology, and anxiety who is followed by Dr. Irish Lack and Dr. Caryl Comes and presents today for pre-op evaluation.   Patient has a long history of non-ischemic cardiomyopathy with EF as low as 35-40% in 2013 but has since normalized. Remote cardiac cath in 2005 showed normal coronaries. There is mention of questionable cardiac sarcoidosis mentioned in piror cardiac notes due to history of pulmonary sarcoidosis. However, reviewed latest notes from Pulmonology which do not mention a diagnosis of pulmonary sarcoidosis. Also do not see mention of cardiac sarcoidosis in Dr. Hassell Done notes. Does not look like patient has ever had a cardiac MRI. Last Echo in 04/2017 showed LVEF of 55-60% with normal wall motion, grade 1 diastolic dysfunction, and mild TR. 1-week Zio Monitor was ordered in 11/2019 for worsening palpitations and showed PVCs and short runs of non-sustained SVT but no serious arrhythmias. Symptoms were associated with infrequent PVCs. Last ischemic evaluation ordered for atypical chest pain was a Myoview in 03/2020 which was low risk and showed no  evidence of ischemia.   Patient was last see by Tommye Standard, PA-C, in 12/2020 at which time he was doing well from a cardiac standpoint with only occasional palpitations and stable chronic shortness of breath.   She was seen by her PCP on 04/10/2021 for bilateral leg cramps. CK was checked and was elevated at 594. She was advised to stop her Simvastatin which she had been taken for at least a decade. CK was rechecked 4 days later and had improved to 200. ALT also slightly elevated over the past year but she does have known fatty liver disease. She was referred to our lipid clinic for further management and seen in our PharmD Clinic on 04/19/2021. CK was rechecked at that visit and was up to 266 but was coming back down on recheck on 05/01/2021. Muscle aches had improved that time. Plan is to start Nexlizet after upcoming knee surgery.   Patient presents today for pre-op evaluation for upcoming total knee replacement.  Here alone.  Patient recently had COVID about 2 weeks ago.  Thankfully, she had a very mild case.  She denies any new or worsening shortness of breath. She has stable orthopnea but no PND. She states she has been using her CPAP machine more. She does note some chest discomfort at rest. She states she has had this discomfort before. She initially stated that it feels like "indigestion" but different than her prior reflux. However, the more we discussed this the more it became clear that this was associated with feeling of her heart fluttering. She has noted more palpitations recently. Episodes are brief. She states she has been consuming more caffeine and is wondering if this  could be the cause. Multiple PVCs noted on EKG today. She also notes some right breast tenderness since having COVID. Reproducible with palpation. No exertional chest pain or anything that sounds like angina. No lightheadedness/dizziness or syncope.  Past Medical History:  Diagnosis Date   AICD (automatic  cardioverter/defibrillator) present    Anxiety    Arthritis    Back pain    Breast cancer ([place]) 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   Bronchiectasis ([place])    Cerebral aneurysm, nonruptured    had a clip put in   CHF (congestive heart failure) (HCC)    Clostridium difficile infection    Depressive disorder, not elsewhere classified    Diverticulosis of colon (without mention of hemorrhage)    Esophageal reflux    Fatty liver    Fibromyalgia    Gastritis    GERD (gastroesophageal reflux disease)    GI bleed 2004   Glaucoma    Hiatal hernia    History of COVID-19 05/04/2021   Hyperlipidemia    Hypertension    Hypothyroidism    Internal hemorrhoids    Joint pain    Obstructive sleep apnea (adult) (pediatric)    Osteoarthritis    Ostium secundum type atrial septal defect    Other chronic nonalcoholic liver disease    Other pulmonary embolism and infarction    Palpitations    Paroxysmal ventricular tachycardia (HCC)    Personal history of radiation therapy    PONV (postoperative nausea and vomiting)    Presence of permanent cardiac pacemaker    PUD (peptic ulcer disease)    Radiation 02/03/15-03/10/15   Right Breast   Sarcoid    per pt , not sure   Schatzki's ring    Shortness of breath    Sleep apnea    Stroke [facility]) 2013   tia/ pt feels it was around 2008 0r 2009   Takotsubo syndrome    Tubular adenoma of colon    Unspecified transient cerebral ischemia    Unspecified vitamin D deficiency     Past Surgical History:  Procedure Laterality Date   ABI  2006   normal   BREAST EXCISIONAL BIOPSY     BREAST LUMPECTOMY WITH RADIOACTIVE SEED LOCALIZATION Right 01/03/2015   Procedure: BREAST LUMPECTOMY WITH RADIOACTIVE SEED LOCALIZATION;  Surgeon: Autumn Messing III, MD;  Location: [place] OR;  Service: General;  Laterality: Right;   CARDIAC DEFIBRILLATOR PLACEMENT  2006; 2012   BSX single chamber ICD   carotid dopplers  2006   neg   CEREBRAL ANEURYSM REPAIR  02/1999    COLONOSCOPY     HAMMER TOE SURGERY  11/15/2020   3rd digit bilateral feet    hospitalization  2004   GI bleed, PUD, diverticulosis (EGD,colonscopy)   hospitalization     PE, NSVT, s/p defib   KNEE ARTHROSCOPY     bilateral   LEFT HEART CATHETERIZATION WITH CORONARY ANGIOGRAM N/A 02/22/2012   Procedure: LEFT HEART CATHETERIZATION WITH CORONARY ANGIOGRAM;  Surgeon: Burnell Blanks, MD;  Location[facility] C[facility] LAB;  Service: Cardiovascular;  Laterality: N/A;   PARTIAL HYSTERECTOMY     Fibroids   TONGUE BIOPSY  12/12/2017   due to sore tongue and white patches/abnormal cells   TOTAL KNEE ARTHROPLASTY Left 02/13/2016   Procedure: TOTAL KNEE ARTHROPLASTY;  Surgeon: Vickey Huger, MD;  Location: [facility];  Service: Orthopedics;  Laterality: Left;   UPPER GASTROINTESTINAL ENDOSCOPY      Current Medications: Current Meds  Medication Sig  acetaminophen (TYLENOL) 500 MG tablet Take 500 mg by mouth every 6 (six) hours as needed for moderate pain.   albuterol (PROVENTIL) (2.5 MG/3ML) 0.083% nebulizer solution USE 1 VIAL IN NEBULIZER EVERY 6 HOURS (Patient taking differently: Take 2.5 mg by nebulization every 6 (six) hours as needed for shortness of breath.)   albuterol (VENTOLIN HFA) 108 (90 Base) MCG/ACT inhaler Inhale 2 puffs into the lungs every 6 (six) hours as needed for wheezing or shortness of breath.   Ascorbic Acid (VITAMIN C) 1000 MG tablet Take 1,000 mg by mouth daily.   aspirin 81 MG tablet Take 81 mg by mouth daily.   baclofen (LIORESAL) 10 MG tablet TAKE 1 TABLET BY MOUTH AT BEDTIME AS NEEDED FOR MUSCLE SPASM   calcium carbonate (OS-CAL - DOSED IN MG OF ELEMENTAL CALCIUM) 1250 (500 Ca) MG tablet Take 1 tablet by mouth daily.   carvedilol (COREG) 12.5 MG tablet Take 1 tablet (12.5 mg total) by mouth 2 (two) times daily.   Cholecalciferol (DIALYVITE VITAMIN D 5000) 125 MCG (5000 UT) capsule Take 5,000 Units by mouth daily.   diclofenac Sodium (VOLTAREN) 1 % GEL Apply 2-4 grams to affected  joint 4 times daily as needed.   ezetimibe (ZETIA) 10 MG tablet Take 1 tablet (10 mg total) by mouth daily.   fexofenadine (ALLEGRA) 180 MG tablet Take 180 mg by mouth daily.   fluticasone (FLONASE) 50 MCG/ACT nasal spray Place 2 sprays into both nostrils daily as needed for allergies or rhinitis. (Patient taking differently: Place 2 sprays into both nostrils daily.)   furosemide (LASIX) 40 MG tablet Take 20-40 mg by mouth as needed for edema.   hydroxypropyl methylcellulose / hypromellose (ISOPTO TEARS / GONIOVISC) 2.5 % ophthalmic solution Place 1 drop into both eyes at bedtime.   hydrOXYzine (ATARAX/VISTARIL) 25 MG tablet Take 0.5-1 tablets (12.5-25 mg total) by mouth every 8 (eight) hours as needed for itching.   latanoprost (XALATAN) 0.005 % ophthalmic solution Place 1 drop into both eyes at bedtime.   levothyroxine (SYNTHROID) 50 MCG tablet Take 1 tablet (50 mcg total) by mouth daily.   magnesium oxide (MAG-OX) 400 MG tablet Take 1 tablet (400 mg total) by mouth in the morning, at noon, and at bedtime. (Patient taking differently: Take 400 mg by mouth 2 (two) times daily.)   mometasone (ELOCON) 0.1 % lotion Apply 1 application topically daily as needed (rash).   montelukast (SINGULAIR) 10 MG tablet Take 1 tablet (10 mg total) by mouth at bedtime.   Multiple Vitamins-Minerals (MULTIVITAMIN PO) Take 1 tablet by mouth daily.   nitroGLYCERIN (NITROSTAT) 0.4 MG SL tablet DISSOLVE ONE TABLET UNDER THE TONGUE EVERY 5 MINUTES AS NEEDED FOR CHEST PAIN.  DO NOT EXCEED A TOTAL OF 3 DOSES IN 15 MINUTES   pantoprazole (PROTONIX) 40 MG tablet Take 1 tablet (40 mg total) by mouth daily. (Patient taking differently: Take 40 mg by mouth See admin instructions. Take 40 mg daily, may take a second 40 mg dose as needed for acid reflux)   phenol (CHLORASEPTIC) 1.4 % LIQD Use as directed 1 spray in the mouth or throat 3 (three) times daily as needed for throat irritation / pain.   polyethylene glycol powder  (GLYCOLAX/MIRALAX) powder Take 17 grams by mouth daily   potassium chloride (KLOR-CON) 10 MEQ tablet TAKE 1/2 TABLET EVERY DAY (Patient taking differently: Take 5 mEq by mouth daily as needed (when taking lasix).)   Respiratory Therapy Supplies (FLUTTER) DEVI 1 Device by Does not apply  route as needed.   sodium chloride HYPERTONIC 3 % nebulizer solution Take by nebulization 2 (two) times daily as needed for cough. 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   traMADol (ULTRAM) 50 MG tablet Take 1 tablet (50 mg total) by mouth 2 (two) times daily. (Patient taking differently: Take 50 mg by mouth 2 (two) times daily as needed for moderate pain.)   traZODone (DESYREL) 50 MG tablet Take 0.5-1 tablets (25-50 mg total) by mouth at bedtime as needed for sleep. (Patient taking differently: Take 50 mg by mouth at bedtime as needed for sleep.)   zolpidem (AMBIEN) 5 MG tablet Take 1 tablet (5 mg total) by mouth at bedtime as needed for sleep. (Patient taking differently: Take 5 mg by mouth at bedtime.)     Allergies:   Ace inhibitors, Amitriptyline hcl, Atorvastatin, Cymbalta [duloxetine hcl], Dilantin [phenytoin sodium extended], Paroxetine, Ramipril, Rosuvastatin, Paxlovid [nirmatrelvir-ritonavir], Simvastatin, Carbamazepine, Codeine, Iodine-131, Phenytoin sodium extended, and Wellbutrin [bupropion]   Social History   Socioeconomic History   Marital status: Divorced    Spouse name: Not on file   Number of children: 2   Years of education: 14   Highest education level: Not on file  Occupational History   Occupation: DISABLED  Tobacco Use   Smoking status: Former    Packs/day: 1.00    Years: 15.00    Pack years: 15.00    Types: Cigarettes    Quit date: 08/20/1989    Years since quitting: 31.7   Smokeless tobacco: Never   Tobacco comments:    Started at 69; less than 1 PPD  Vaping Use   Vaping Use: Never used  Substance and Sexual Activity   Alcohol use: Yes    Comment: occas   Drug use: Not Currently    Types:  Marijuana    Comment: marijuana candy last used 2 months ago   Sexual activity: Not Currently  Other Topics Concern   Not on file  Social History Narrative   Lives alone   Caffeine VZC:HYIF   Retired from [employer]   2 children -- 5 grandbabies   Live alone   Social Determinants of Radio broadcast assistant Strain: Low Risk    Difficulty of Paying Living Expenses: Not hard at all  Food Insecurity: No Food Insecurity   Worried About Charity fundraiser in the Last Year: Never true   Arboriculturist in the Last Year: Never true  Transportation Needs: No Transportation Needs   Lack of Transportation (Medical): No   Lack of Transportation (Non-Medical): No  Physical Activity: Inactive   Days of Exercise per Week: 0 days   Minutes of Exercise per Session: 0 min  Stress: No Stress Concern Present   Feeling of Stress : Not at all  Social Connections: Moderately Isolated   Frequency of Communication with Friends and Family: More than three times a week   Frequency of Social Gatherings with Friends and Family: More than three times a week   Attends Religious Services: 1 to 4 times per year   Active Member of [employer] or Organizations: No   Attends Music therapist: Never   Marital Status: Divorced     Family History: The patient's family history includes Allergies in her sister; Alzheimer's disease in her mother; Breast cancer in her cousin and maternal aunt; Breast cancer (age of onset: 31) in her maternal aunt; Cancer (age of onset: 59) in her maternal grandmother; Colon cancer (age of onset: 78) in her  maternal aunt; Diabetes in her mother; Heart disease in her brother, father, sister, and son; Hypertension in her mother; Lung cancer in her maternal uncle; Lung cancer (age of onset: 73) in her maternal grandfather; Obesity in her mother; Other in her brother; Pancreatic cancer (age of onset: 50) in her cousin; Prostate cancer (age of onset: 44) in her brother; Throat  cancer in her brother. There is no history of Stroke.  ROS:   Please see the history of present illness.     EKGs/Labs/Other Studies Reviewed:    The following studies were reviewed today:  Echocardiogram 05/13/2017: Study Conclusions: - Left ventricle: The cavity size was normal. Wall thickness was    increased in a pattern of mild LVH. Systolic function was normal.    The estimated ejection fraction was in the range of 55% to 60%.    Wall motion was normal; there were no regional wall motion    abnormalities. Doppler parameters are consistent with abnormal    left ventricular relaxation (grade 1 diastolic dysfunction).  - Mitral valve: Mildly thickened leaflets . Valve area by pressure    half-time: 2.32 cm^2.  - Left atrium: Volume/bsa, ES (1-plane Simpson&'s, A4C): 33.2    ml/m^2.  - Right ventricle: Pacer wire or catheter noted in right ventricle.  - Right atrium: The atrium was mildly dilated.  - Tricuspid valve: There was mild regurgitation.   Impressions: - Compared to the prior study, there has been no significant    interval change.  _______________  Elwyn Reach Monitor 11/2019: Duration: 7 days Findings: - Symptoms of flutters assoc mostly with PVCs  - Symptoms of flutters also assoc with sinus - S VT Nonsustained 6 episodes; fastest 141 bpm for 6 beats; longest for 7 beats at 100 bpm    No serious arrhythmia Symptoms assoc with infrequent PVCs  _______________  Myoview 04/13/2020: The left ventricular ejection fraction is mildly decreased (45-54%). Nuclear stress EF: 50%. There was no ST segment deviation noted during stress. The perfusion study is normal. This is a low risk study.  EKG:  EKG ordered today. EKG personally reviewed and demonstrates normal sinus rhythm, rate 75 bpm, with LVH, multiple PVCs, and non-specific ST/T changes. Normal PR and QRS interval. QTc 397 ms.  Recent Labs: 04/10/2021: Magnesium 2.2; TSH 1.55 04/26/2021: ALT 34; BUN 13; Creatinine, Ser  0.81; Hemoglobin 12.4; Platelets 168; Potassium 4.2; Sodium 142  Recent Lipid Panel    Component Value Date/Time   CHOL 178 12/20/2020 1045   CHOL 171 10/01/2019 1408   TRIG 92.0 12/20/2020 1045   HDL 59.30 12/20/2020 1045   HDL 47 10/01/2019 1408   CHOLHDL 3 12/20/2020 1045   VLDL 18.4 12/20/2020 1045   LDLCALC 100 (H) 12/20/2020 1045   LDLCALC 97 10/01/2019 1408   LDLDIRECT 141.8 07/04/2007 0936    Physical Exam:    Vital Signs: BP 126/64 (BP Location: Left Arm, Patient Position: Sitting, Cuff Size: Large)   Pulse 75   Ht 5' 6.5" (1.689 m)   Wt 217 lb (98.4 kg)   LMP  (LMP Unknown)   BMI 34.50 kg/m     Wt Readings from Last 3 Encounters:  05/15/21 217 lb (98.4 kg)  04/27/21 208 lb 3.2 oz (94.4 kg)  04/26/21 215 lb 4 oz (97.6 kg)     General: 70 y.o. African-American female in no acute distress. HEENT: Normocephalic and atraumatic. Sclera clear.  Neck: Supple. No carotid bruits. No JVD. Heart: RRR. Distinct S1 and S2. No murmurs,  gallops, or rubs. Tenderness with palpation of right breast. Lungs: No increased work of breathing. Faint course crackles in bilateral bases (possibly due to bronchiectasis) but otherwise lungs clear to ausculation bilaterally. No wheezes or rhonchi. Abdomen: Soft, non-distended, and non-tender to palpation.  Extremities: No lower extremity edema.    Skin: Warm and dry. Neuro: Alert and oriented x3. No focal deficits. Psych: Normal affect. Responds appropriately.  Assessment:    1. Pre-op evaluation   2. Non-ischemic cardiomyopathy ([place])   3. Chronic systolic CHF (congestive heart failure) (HCC)   4. Palpitations   5. History of ventricular tachycardia   6. S/P ICD (internal cardiac defibrillator) procedure   7. Essential hypertension   8. Hyperlipidemia, unspecified hyperlipidemia type   9. Elevated CK   10. Obstructive sleep apnea     Plan:    Pre-Op Evaluation  - Patient has total knee replacement planned for 05/22/2021.  -  Stable from a cardiac standpoint. She notes some very mild chest discomfort recently associated with palpitations but nothing that sounds like angina. No acute CHF symptoms. No syncope.  - Able to complete >4.0 METS without any angina. Per Revised Cardiac Risk Index, considered moderate risk with a 6.6% risk of adverse cardiac events (MI, pulmonary edema, V.Fib, cardiac arrest, or complete heart block) given history of CHF and prior CVA. Will have patient send in remote device interrogation given history of VT with more palpitations recently. If that is unremarkable, she will be at acceptable risk for planned procedure without any further cardiovascular testing. Patient has no known history of CAD; therefore, OK to hold Aspirin for 5-7 days prior to procedure from a cardiac standpoint with plans to resume this as soon as safely possible postoperatively. However, given history of CVA would also recommend reaching out to PCP/Neurology for their recommendations.   Non-Ischemic Cardiomyopathy Chronic Systolic CHF with Improved EF - LVEF as low as 35-40% in 2013 but has since normalized. Last Echo in 2018 showed LVEF of 55-60%. Questionable history of possible sarcoidosis but unclear.  - Appears euvolemic on exam. She has very faint course crackles in bilateral bases but she states this is chronic for her. Possibly from bronchiectasis. She has follow-up with Pulmonology scheduled for later this week. - Continue Lasix as needed for weight gain or edema.  - Continue Coreg 12.5mg  twice daily.  - Not on ACEi/ARB due to possible angioedema on ACEi.  - Discussed importance of daily weights and sodium/fluid restrictions.   Palpitations - She notes increased palpitations recently with associated mild chest discomfort. May be due to increased caffeine.  - PVCs noted on EKG today. - Given history of VT, have asked patient to send in remote interrogation when she gets home. - Will check BMET and Magnesium today.  -  Continue current dose of Coreg for now. Patient states baseline rates are in the 60s. Will have patient keep BP/HR log for 1-2 weeks and then send [place] a MyChart message. If BP/HR will allows, can increase Coreg.  History of VT s/p ICD - S/p [employer] ICD in 12/2009. - Last device check in 02/3032 showed no ventricular arrhythmias and no shocks.  - Followed by Dr. Caryl Comes.   Hypertension - BP well controlled.  - Continue Coreg as above.  Hyperlipidemia Elevated CK - Last lipid panel in 12/2020: Total Cholesterol 178, Triglycerides 92, HDL 59.30, LDL 100. - LDL goal <70 given prior CVA. - Recently noted to have significant myalgias and found to have elevated CK as  high as 594. Simvastatin was stopped and patient has been followed by our PharmD lipid clinic. CK has trended down and plan is to start Nexlizet after upcoming surgery. Currently on Zetia 10mg  daily. - Will recheck CK today given that we are getting other blood work per patient's request.  Obstructive Sleep Apnea - Continue CPAP.  Disposition: Follow up in 6 months with Dr. Irish Lack.  ADDENDUM 05/15/2021 at 10:43PM: Device interrogation reviewed by device clinic RN and showed no ventricular tachycardia therapies. There were 2 episodes of non-sustained SVT on 04/06/21 and 03/18/21; however, both episodes were brief and lasted less than 10 seconds in duration. Patient OK to proceed with knee surgery. As noted above, patient has no known history of CAD; therefore, OK to hold Aspirin for 5-7 days prior to procedure from a cardiac standpoint with plans to resume this as soon as safely possible postoperatively. However, given history of CVA would also recommend reaching out to PCP/Neurology for their recommendations. Recommend continuing Coreg and monitoring on telemetry perioperatively given history of VT. Will route note to requesting surgeon's office.   Medication Adjustments/Labs and Tests Ordered: Current medicines are reviewed at  length with the patient today.  Concerns regarding medicines are outlined above.  Orders Placed This Encounter  Procedures   Magnesium   Basic metabolic panel   CK   EKG 12-Lead   No orders of the defined types were placed in this encounter.   Patient Instructions  Medication Instructions:  No changes *If you need a refill on your cardiac medications before your next appointment, please call your pharmacy*   Lab Work: BMET,CK,Magnesium If you have labs (blood work) drawn today and your tests are completely normal, you will receive your results only by: [place] (if you have MyChart) OR A paper copy in the mail If you have any lab test that is abnormal or we need to change your treatment, we will call you to review the results.   Testing/Procedures: No Testing   Follow-Up: At [facility], you and your health needs are our priority.  As part of our continuing mission to provide you with exceptional heart care, we have created designated Provider Care Teams.  These Care Teams include your primary Cardiologist (physician) and Advanced Practice Providers (APPs -  Physician Assistants and Nurse Practitioners) who all work together to provide you with the care you need, when you need it.  We recommend signing up for the patient portal called "MyChart".  Sign up information is provided on this After Visit Summary.  MyChart is used to connect with patients for Virtual Visits (Telemedicine).  Patients are able to view lab/test results, encounter notes, upcoming appointments, etc.  Non-urgent messages can be sent to your provider as well.   To learn more about what you can do with MyChart, go to [email or web address].    Your next appointment:   6 month(s)  The format for your next appointment:   In Person  Provider:   Casandra Doffing, MD      Signed, Darreld Mclean, PA-C  05/15/2021 1:05 PM    [place]

## 2021-05-14 NOTE — Telephone Encounter (Signed)
Patient's appointment has been rescheduled for 05/15/2021 which will be >10 days from diagnosis of COVID. Patient aware of new appointment time.

## 2021-05-15 ENCOUNTER — Other Ambulatory Visit: Payer: Self-pay

## 2021-05-15 ENCOUNTER — Encounter: Payer: Self-pay | Admitting: Student

## 2021-05-15 ENCOUNTER — Ambulatory Visit (INDEPENDENT_AMBULATORY_CARE_PROVIDER_SITE_OTHER): Payer: Medicare Other | Admitting: Student

## 2021-05-15 VITALS — BP 126/64 | HR 75 | Ht 66.5 in | Wt 217.0 lb

## 2021-05-15 DIAGNOSIS — G4733 Obstructive sleep apnea (adult) (pediatric): Secondary | ICD-10-CM

## 2021-05-15 DIAGNOSIS — Z8679 Personal history of other diseases of the circulatory system: Secondary | ICD-10-CM

## 2021-05-15 DIAGNOSIS — I5022 Chronic systolic (congestive) heart failure: Secondary | ICD-10-CM

## 2021-05-15 DIAGNOSIS — I10 Essential (primary) hypertension: Secondary | ICD-10-CM

## 2021-05-15 DIAGNOSIS — R002 Palpitations: Secondary | ICD-10-CM | POA: Diagnosis not present

## 2021-05-15 DIAGNOSIS — Z01818 Encounter for other preprocedural examination: Secondary | ICD-10-CM

## 2021-05-15 DIAGNOSIS — I428 Other cardiomyopathies: Secondary | ICD-10-CM

## 2021-05-15 DIAGNOSIS — R748 Abnormal levels of other serum enzymes: Secondary | ICD-10-CM

## 2021-05-15 DIAGNOSIS — Z95810 Presence of automatic (implantable) cardiac defibrillator: Secondary | ICD-10-CM

## 2021-05-15 DIAGNOSIS — E785 Hyperlipidemia, unspecified: Secondary | ICD-10-CM

## 2021-05-15 LAB — BASIC METABOLIC PANEL
BUN/Creatinine Ratio: 20 (ref 12–28)
BUN: 15 mg/dL (ref 8–27)
CO2: 27 mmol/L (ref 20–29)
Calcium: 9.9 mg/dL (ref 8.7–10.3)
Chloride: 99 mmol/L (ref 96–106)
Creatinine, Ser: 0.76 mg/dL (ref 0.57–1.00)
Glucose: 91 mg/dL (ref 70–99)
Potassium: 4.5 mmol/L (ref 3.5–5.2)
Sodium: 139 mmol/L (ref 134–144)
eGFR: 83 mL/min/{1.73_m2} (ref >59)

## 2021-05-15 LAB — MAGNESIUM: Magnesium: 2.3 mg/dL (ref 1.6–2.3)

## 2021-05-15 LAB — CK: Total CK: 77 U/L (ref 32–182)

## 2021-05-15 NOTE — Patient Instructions (Signed)
Medication Instructions:  No changes *If you need a refill on your cardiac medications before your next appointment, please call your pharmacy*   Lab Work: BMET,CK,Magnesium If you have labs (blood work) drawn today and your tests are completely normal, you will receive your results only by: [place] (if you have MyChart) OR A paper copy in the mail If you have any lab test that is abnormal or we need to change your treatment, we will call you to review the results.   Testing/Procedures: No Testing   Follow-Up: At [facility], you and your health needs are our priority.  As part of our continuing mission to provide you with exceptional heart care, we have created designated Provider Care Teams.  These Care Teams include your primary Cardiologist (physician) and Advanced Practice Providers (APPs -  Physician Assistants and Nurse Practitioners) who all work together to provide you with the care you need, when you need it.  We recommend signing up for the patient portal called "MyChart".  Sign up information is provided on this After Visit Summary.  MyChart is used to connect with patients for Virtual Visits (Telemedicine).  Patients are able to view lab/test results, encounter notes, upcoming appointments, etc.  Non-urgent messages can be sent to your provider as well.   To learn more about what you can do with MyChart, go to [email or web address].    Your next appointment:   6 month(s)  The format for your next appointment:   In Person  Provider:   Casandra Doffing, MD

## 2021-05-16 NOTE — Progress Notes (Signed)
ICD orders received printed and placed in chart. Cardiac pre op evaluation note 05/15/21 in epic.

## 2021-05-16 NOTE — Anesthesia Preprocedure Evaluation (Addendum)
Anesthesia Evaluation  Patient identified by MRN, date of birth, ID band Patient awake    Reviewed: Allergy & Precautions, NPO status , Patient's Chart, lab work & pertinent test results  History of Anesthesia Complications (+) PONV and history of anesthetic complications  Airway Mallampati: II  TM Distance: >3 FB Neck ROM: Full    Dental   Pulmonary sleep apnea , former smoker,    Pulmonary exam normal breath sounds clear to auscultation       Cardiovascular METS: 3 - Mets hypertension, +CHF  + pacemaker + Cardiac Defibrillator (h/o VT and low EF now recovered)  Rhythm:Regular Rate:Normal     Neuro/Psych PSYCHIATRIC DISORDERS Anxiety Depression  Neuromuscular disease (statin myopathy) CVA    GI/Hepatic hiatal hernia, PUD, GERD  Medicated,  Endo/Other  Hypothyroidism   Renal/GU      Musculoskeletal  (+) Arthritis , Fibromyalgia -  Abdominal   Peds  Hematology negative hematology ROS (+)   Anesthesia Other Findings   Reproductive/Obstetrics                           Anesthesia Physical Anesthesia Plan  ASA: 4  Anesthesia Plan: Regional and Spinal   Post-op Pain Management:    Induction:   PONV Risk Score and Plan: 3 and Treatment may vary due to age or medical condition, Midazolam, Ondansetron and Dexamethasone  Airway Management Planned: Natural Airway and Simple Face Mask  Additional Equipment: None  Intra-op Plan:   Post-operative Plan: Extubation in OR  Informed Consent: I have reviewed the patients History and Physical, chart, labs and discussed the procedure including the risks, benefits and alternatives for the proposed anesthesia with the patient or authorized representative who has indicated his/her understanding and acceptance.     Dental advisory given  Plan Discussed with: CRNA, Anesthesiologist and Surgeon  Anesthesia Plan Comments: (See PAT note 04/26/2021,  Konrad Felix Ward, PA-C)      Anesthesia Quick Evaluation

## 2021-05-17 ENCOUNTER — Ambulatory Visit (INDEPENDENT_AMBULATORY_CARE_PROVIDER_SITE_OTHER): Payer: Medicare Other

## 2021-05-17 ENCOUNTER — Ambulatory Visit (INDEPENDENT_AMBULATORY_CARE_PROVIDER_SITE_OTHER): Payer: Medicare Other | Admitting: Primary Care

## 2021-05-17 ENCOUNTER — Encounter: Payer: Self-pay | Admitting: Primary Care

## 2021-05-17 ENCOUNTER — Other Ambulatory Visit: Payer: Self-pay

## 2021-05-17 VITALS — BP 114/70 | HR 76 | Temp 98.2°F | Ht 66.5 in | Wt 215.0 lb

## 2021-05-17 DIAGNOSIS — J479 Bronchiectasis, uncomplicated: Secondary | ICD-10-CM

## 2021-05-17 DIAGNOSIS — G4733 Obstructive sleep apnea (adult) (pediatric): Secondary | ICD-10-CM

## 2021-05-17 DIAGNOSIS — Z01811 Encounter for preprocedural respiratory examination: Secondary | ICD-10-CM | POA: Diagnosis not present

## 2021-05-17 NOTE — Patient Instructions (Addendum)
Nice seeing you today Ms [name] are considered low risk for post op pulmonary complications   Recommend incentive spirometry and early ambulation after surgery   Recommendations: Continue CPAP aim to wear every night 4-6 hours or more  Orders: CXR - preop  New CPAP machine at 5cm h20   Follow-up: February with Dr. Halford Chessman

## 2021-05-17 NOTE — Progress Notes (Signed)
@Patient  ID: [name], female    DOB: 03-Mar-1951, 70 y.o.   MRN: [id number]  Chief Complaint  Patient presents with   Follow-up    Pre-op surgical clearance per patient.     Referring provider: Camillia Herter, NP  HPI: 70 year old female, former smoker quit 1991 (2000).  Past medical history significant of obstructive sleep apnea, bronchiectasis.  Patient of Dr. Halford Chessman, seen in office on 04/04/2021.  05/17/2021- interim hx  Patient presents today for surgical risk assessment for total knee arthroplasty in October with Dr. Vickey Huger. She is doing well today, no acute complaints. She had covid earlier this month treated with paxlovid. She has no residual respiratory symptoms. She is moderately compliant with CPAP. Needs new machine >6 years. Humidification no longer working. Denies cough or shortness of breath.   Airview download 04/16/21-05/15/21 Usage 10/30 days used  Average usage days used 5 hours 7 mins Pressure 5cm h20 Airleak 0.8L/min AHI 4.9   1) RISK FOR PROLONGED MECHANICAL VENTILAION - > 48h  1A) Arozullah - Prolonged mech ventilation risk Arozullah Postperative Pulmonary Risk Score - for mech ventilation dependence >48h [employer], [place] [facility] 2000, major non-cardiac surgery) Comment Score  Type of surgery - abd ao aneurysm (27), thoracic (21), neurosurgery / upper abdominal / vascular (21), neck (11) Hip surgery 6  Emergency Surgery - (11)  0  ALbumin < 3 or poor nutritional state - (9)  0  BUN > 30 -  (8)  0  Partial or completely dependent functional status - (7)  0  COPD -  (6)  0  Age - 60 to 69 (4), > 70  (6)  6  TOTAL  12  Risk Stratifcation scores  - < 10 (0.5%), 11-19 (1.8%), 20-27 (4.2%), 28-40 (10.1%), >40 (26.6%)  1.8% risk prolonged mech ventilation     1B) GUPTA - Prolonged Mech Vent Risk Score source Risk  Guptal post op prolonged mech ventilation > 48h or reintubation < 30 days - ACS 2007-2008 dataset -  http://lewis-perez.info/ 0.3 % Risk of mechanical ventilation for >48 hrs after surgery, or unplanned intubation ?30 days of surgery    2) RISK FOR POST OP PNEUMONIA Score source Risk  Lyndel Safe - Post Op Pnemounia risk  [email or web address] 0.3 % Risk of postoperative pneumonia    R3) ISK FOR ANY POST-OP PULMONARY COMPLICATION Score source Risk  CANET/ARISCAT Score - risk for ANY/ALl pulmonary complications - > risk of in-hospital post-op pulmonary complications (composite including respiratory failure, respiratory infection, pleural effusion, atelectasis, pneumothorax, bronchospasm, aspiration pneumonitis) [email or web address] - based on age, anemia, pulse ox, resp infection prior 30d, incision site, duration of surgery, and emergency v elective surgery Low risk 1.6% risk of in-hospital post-op pulmonary complications (composite including respiratory failure, respiratory infection, pleural effusion, atelectasis, pneumothorax, bronchospasm, aspiration pneumonitis)     Allergies  Allergen Reactions   Ace Inhibitors Swelling    Angioedema; makes tongue "break out"    Amitriptyline Hcl Other (See Comments)     makes her too sleepy!   Atorvastatin Other (See Comments)    SEVERE MYALGIA   Cymbalta [Duloxetine Hcl] Nausea Only and Other (See Comments)    Sleepiness/ sick   Dilantin [Phenytoin Sodium Extended] Rash    Severe rash   Paroxetine Nausea Only    Rapid heartbeat   Ramipril Other (See Comments)    TONGUE ULCERS    Rosuvastatin Other (See Comments)    SEVERE MYALGIA   Carbamazepine Rash   Codeine Itching  Iodine-131 Other (See Comments)    Swelling at IV site only, no swelling or SOB around mouth.!   Paxlovid [Nirmatrelvir-Ritonavir] Itching   Phenytoin Sodium Extended Rash   Simvastatin Other (See Comments)    Increase in CK, myalgias    Wellbutrin [Bupropion] Palpitations    Immunization History  Administered Date(s) Administered   Fluad Quad(high Dose 65+) 04/17/2019, 04/30/2020   H1N1 07/24/2008   Influenza Split 07/17/2011, 09/27/2011, 04/29/2012   Influenza Whole 07/20/2004, 07/04/2007, 05/07/2008, 05/19/2009, 05/17/2010   Influenza, High Dose Seasonal PF 05/30/2017, 05/21/2018   Influenza,inj,Quad PF,6+ Mos 05/20/2013, 05/06/2014, 05/17/2015, 05/15/2016, 04/27/2021   Influenza-Unspecified 05/07/2018   PFIZER(Purple Top)SARS-COV-2 Vaccination 09/25/2019, 10/16/2019, 06/04/2020, 01/04/2021   Pneumococcal Conjugate-13 11/22/2015   Pneumococcal Polysaccharide-23 05/20/2002, 05/07/2008, 06/25/2014   Td 09/07/2009   Zoster, Live 09/21/2011    Past Medical History:  Diagnosis Date   AICD (automatic cardioverter/defibrillator) present    Anxiety    Arthritis    Back pain    Breast cancer ([place]) 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   Bronchiectasis ([place])    Cerebral aneurysm, nonruptured    had a clip put in   CHF (congestive heart failure) ([place])    Clostridium difficile infection    Depressive disorder, not elsewhere classified    Diverticulosis of colon (without mention of hemorrhage)    Esophageal reflux    Fatty liver    Fibromyalgia    Gastritis    GERD (gastroesophageal reflux disease)    GI bleed 2004   Glaucoma    Hiatal hernia    History of COVID-19 05/04/2021   Hyperlipidemia    Hypertension    Hypothyroidism    Internal hemorrhoids    Joint pain    Obstructive sleep apnea (adult) (pediatric)    Osteoarthritis    Ostium secundum type atrial septal defect    Other chronic nonalcoholic liver disease    Other pulmonary embolism and infarction    Palpitations    Paroxysmal ventricular tachycardia (HCC)    Personal history of radiation therapy    PONV (postoperative nausea and vomiting)    Presence of permanent cardiac pacemaker    PUD (peptic ulcer disease)    Radiation  02/03/15-03/10/15   Right Breast   Sarcoid    per pt , not sure   Schatzki's ring    Shortness of breath    Sleep apnea    Stroke ([place]) 2013   tia/ pt feels it was around 2008 0r 2009   Takotsubo syndrome    Tubular adenoma of colon    Unspecified transient cerebral ischemia    Unspecified vitamin D deficiency     Tobacco History: Social History   Tobacco Use  Smoking Status Former   Packs/day: 1.00   Years: 15.00   Pack years: 15.00   Types: Cigarettes   Quit date: 08/20/1989   Years since quitting: 31.7  Smokeless Tobacco Never  Tobacco Comments   Started at 42; less than 1 PPD   Counseling given: Not Answered Tobacco comments: Started at 4; less than 1 PPD   Outpatient Medications Prior to Visit  Medication Sig Dispense Refill   acetaminophen (TYLENOL) 500 MG tablet Take 500 mg by mouth every 6 (six) hours as needed for moderate pain.     albuterol (PROVENTIL) (2.5 MG/3ML) 0.083% nebulizer solution USE 1 VIAL IN NEBULIZER EVERY 6 HOURS (Patient taking differently: Take 2.5 mg by nebulization every 6 (six) hours as needed for shortness of breath.) 120  mL 11   albuterol (VENTOLIN HFA) 108 (90 Base) MCG/ACT inhaler Inhale 2 puffs into the lungs every 6 (six) hours as needed for wheezing or shortness of breath. 18 g 3   Ascorbic Acid (VITAMIN C) 1000 MG tablet Take 1,000 mg by mouth daily.     aspirin 81 MG tablet Take 81 mg by mouth daily.     baclofen (LIORESAL) 10 MG tablet TAKE 1 TABLET BY MOUTH AT BEDTIME AS NEEDED FOR MUSCLE SPASM 30 tablet 2   calcium carbonate (OS-CAL - DOSED IN MG OF ELEMENTAL CALCIUM) 1250 (500 Ca) MG tablet Take 1 tablet by mouth daily.     carvedilol (COREG) 12.5 MG tablet Take 1 tablet (12.5 mg total) by mouth 2 (two) times daily. 180 tablet 3   Cholecalciferol (DIALYVITE VITAMIN D 5000) 125 MCG (5000 UT) capsule Take 5,000 Units by mouth daily.     diclofenac Sodium (VOLTAREN) 1 % GEL Apply 2-4 grams to affected joint 4 times daily as needed.  400 g 2   ezetimibe (ZETIA) 10 MG tablet Take 1 tablet (10 mg total) by mouth daily. 30 tablet 11   fexofenadine (ALLEGRA) 180 MG tablet Take 180 mg by mouth daily.     fluticasone (FLONASE) 50 MCG/ACT nasal spray Place 2 sprays into both nostrils daily as needed for allergies or rhinitis. (Patient taking differently: Place 2 sprays into both nostrils daily.) 48 g 3   furosemide (LASIX) 40 MG tablet Take 20-40 mg by mouth as needed for edema.     hydroxypropyl methylcellulose / hypromellose (ISOPTO TEARS / GONIOVISC) 2.5 % ophthalmic solution Place 1 drop into both eyes at bedtime.     latanoprost (XALATAN) 0.005 % ophthalmic solution Place 1 drop into both eyes at bedtime.     levothyroxine (SYNTHROID) 50 MCG tablet Take 1 tablet (50 mcg total) by mouth daily. 90 tablet 1   magnesium oxide (MAG-OX) 400 MG tablet Take 1 tablet (400 mg total) by mouth in the morning, at noon, and at bedtime. (Patient taking differently: Take 400 mg by mouth 2 (two) times daily.) 270 tablet 3   mometasone (ELOCON) 0.1 % lotion Apply 1 application topically daily as needed (rash). 60 mL 1   montelukast (SINGULAIR) 10 MG tablet Take 1 tablet (10 mg total) by mouth at bedtime. 90 tablet 3   Multiple Vitamins-Minerals (MULTIVITAMIN PO) Take 1 tablet by mouth daily.     nitroGLYCERIN (NITROSTAT) 0.4 MG SL tablet DISSOLVE ONE TABLET UNDER THE TONGUE EVERY 5 MINUTES AS NEEDED FOR CHEST PAIN.  DO NOT EXCEED A TOTAL OF 3 DOSES IN 15 MINUTES 25 tablet 0   pantoprazole (PROTONIX) 40 MG tablet Take 1 tablet (40 mg total) by mouth daily. (Patient taking differently: Take 40 mg by mouth See admin instructions. Take 40 mg daily, may take a second 40 mg dose as needed for acid reflux) 30 tablet 11   phenol (CHLORASEPTIC) 1.4 % LIQD Use as directed 1 spray in the mouth or throat 3 (three) times daily as needed for throat irritation / pain. 20 mL 0   polyethylene glycol powder (GLYCOLAX/MIRALAX) powder Take 17 grams by mouth daily 1080 g  0   potassium chloride (KLOR-CON) 10 MEQ tablet TAKE 1/2 TABLET EVERY DAY (Patient taking differently: Take 5 mEq by mouth daily as needed (when taking lasix).) 15 tablet 0   Respiratory Therapy Supplies (FLUTTER) DEVI 1 Device by Does not apply route as needed. 1 each 0   sodium chloride HYPERTONIC 3 %  nebulizer solution Take by nebulization 2 (two) times daily as needed for cough. 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 750 mL 12   traMADol (ULTRAM) 50 MG tablet Take 1 tablet (50 mg total) by mouth 2 (two) times daily. (Patient taking differently: Take 50 mg by mouth 2 (two) times daily as needed for moderate pain.) 180 tablet 1   traZODone (DESYREL) 50 MG tablet Take 0.5-1 tablets (25-50 mg total) by mouth at bedtime as needed for sleep. (Patient taking differently: Take 50 mg by mouth at bedtime as needed for sleep.) 90 tablet 0   zolpidem (AMBIEN) 5 MG tablet Take 1 tablet (5 mg total) by mouth at bedtime as needed for sleep. (Patient taking differently: Take 5 mg by mouth at bedtime.) 90 tablet 0   hydrOXYzine (ATARAX/VISTARIL) 25 MG tablet Take 0.5-1 tablets (12.5-25 mg total) by mouth every 8 (eight) hours as needed for itching. 30 tablet 0   No facility-administered medications prior to visit.    Review of Systems  Review of Systems  Constitutional: Negative.   HENT: Negative.    Respiratory:  Negative for cough and shortness of breath.     Physical Exam  BP 114/70 (BP Location: Left Arm, Patient Position: Sitting, Cuff Size: Normal)   Pulse 76   Temp 98.2 F (36.8 C) (Oral)   Ht 5' 6.5" (1.689 m)   Wt 215 lb (97.5 kg)   LMP  (LMP Unknown)   SpO2 98%   BMI 34.18 kg/m  Physical Exam Constitutional:      Appearance: Normal appearance.  HENT:     Head: Normocephalic and atraumatic.  Cardiovascular:     Rate and Rhythm: Normal rate and regular rhythm.  Pulmonary:     Breath sounds: No wheezing or rhonchi.     Comments: Scattered rales left base Musculoskeletal:        General: Normal  range of motion.  Skin:    General: Skin is warm and dry.  Neurological:     General: No focal deficit present.     Mental Status: She is alert and oriented to person, place, and time. Mental status is at baseline.  Psychiatric:        Mood and Affect: Mood normal.        Behavior: Behavior normal.        Thought Content: Thought content normal.        Judgment: Judgment normal.     Lab Results:  CBC    Component Value Date/Time   WBC 3.6 (L) 04/26/2021 1241   RBC 4.20 04/26/2021 1241   HGB 12.4 04/26/2021 1241   HGB 13.5 02/15/2021 1429   HGB 12.4 12/01/2014 0845   HCT 39.9 04/26/2021 1241   HCT 41.9 02/15/2021 1429   HCT 38.8 12/01/2014 0845   PLT 168 04/26/2021 1241   PLT 212 02/15/2021 1429   MCV 95.0 04/26/2021 1241   MCV 94 02/15/2021 1429   MCV 88.5 12/01/2014 0845   MCH 29.5 04/26/2021 1241   MCHC 31.1 04/26/2021 1241   RDW 12.7 04/26/2021 1241   RDW 11.9 02/15/2021 1429   RDW 12.7 12/01/2014 0845   LYMPHSABS 0.8 04/26/2021 1241   LYMPHSABS 0.8 02/15/2021 1429   LYMPHSABS 1.0 12/01/2014 0845   MONOABS 0.3 04/26/2021 1241   MONOABS 0.3 12/01/2014 0845   EOSABS 0.1 04/26/2021 1241   EOSABS 0.1 02/15/2021 1429   EOSABS 0.1 03/13/2007 1249   BASOSABS 0.0 04/26/2021 1241   BASOSABS 0.0 02/15/2021 1429   BASOSABS 0.0  12/01/2014 0845    BMET    Component Value Date/Time   NA 139 05/15/2021 1115   NA 141 12/01/2014 0847   K 4.5 05/15/2021 1115   K 3.8 12/01/2014 0847   CL 99 05/15/2021 1115   CO2 27 05/15/2021 1115   CO2 26 12/01/2014 0847   GLUCOSE 91 05/15/2021 1115   GLUCOSE 89 04/26/2021 1241   GLUCOSE 175 (H) 12/01/2014 0847   BUN 15 05/15/2021 1115   BUN 10.6 12/01/2014 0847   CREATININE 0.76 05/15/2021 1115   CREATININE 0.82 02/27/2017 1518   CREATININE 0.8 12/01/2014 0847   CALCIUM 9.9 05/15/2021 1115   CALCIUM 9.8 12/01/2014 0847   GFRNONAA >60 04/26/2021 1241   GFRNONAA 74 02/27/2017 1518   GFRAA 82 10/01/2019 1408   GFRAA 85  02/27/2017 1518    BNP    Component Value Date/Time   BNP 92.4 03/17/2019 1555   BNP 30.2 08/16/2016 1452    ProBNP    Component Value Date/Time   PROBNP 89 04/26/2017 1451    Imaging: No results found.   Assessment & Plan:   Obstructive sleep apnea - Patient continues to use CPAP, she will at times fall asleep before putting her mask on  - Pressure 5cm h20, residual AHI 4.9 - DME order placed for new machine > 5 years - Encourage compliance with CPAP every night 4-6 hours or longer   Bronchiectasis without complication (HCC) - Stable, no acute respiratory symptoms  - Continue to use mucinex, flutter valve and prn albuterol as needed for congestion  - Needs pre-op CXR today  Pre-operative respiratory examination - Patient is low risk for prolonged mech ventilation and/or post op pulmonary complications d/t hx sleep apnea and bronchiectasis. From pulmonary standpoint she is optimized for surgery. Advised patient to continue to use CPAP pre/post-op and bring with her day of surgery.    Martyn Ehrich, NP 05/18/2021

## 2021-05-18 DIAGNOSIS — Z01811 Encounter for preprocedural respiratory examination: Secondary | ICD-10-CM | POA: Insufficient documentation

## 2021-05-18 NOTE — Progress Notes (Signed)
Reviewed and agree with assessment/plan.   Chesley Mires, MD [facility] Pulmonary/Critical Care 05/18/2021, 9:01 AM Pager:  [phone]

## 2021-05-18 NOTE — Assessment & Plan Note (Addendum)
-   Stable, no acute respiratory symptoms  - Continue to use mucinex, flutter valve and prn albuterol as needed for congestion  - Needs pre-op CXR today

## 2021-05-18 NOTE — Assessment & Plan Note (Addendum)
-   Patient is low risk for prolonged mech ventilation and/or post op pulmonary complications d/t hx sleep apnea and bronchiectasis. From pulmonary standpoint she is optimized for surgery. Advised patient to continue to use CPAP pre/post-op and bring with her day of surgery.

## 2021-05-18 NOTE — Assessment & Plan Note (Signed)
-   Patient continues to use CPAP, she will at times fall asleep before putting her mask on  - Pressure 5cm h20, residual AHI 4.9 - DME order placed for new machine > 5 years - Encourage compliance with CPAP every night 4-6 hours or longer

## 2021-05-19 ENCOUNTER — Encounter: Payer: Self-pay | Admitting: Physician Assistant

## 2021-05-19 ENCOUNTER — Encounter: Payer: Self-pay | Admitting: Podiatry

## 2021-05-19 NOTE — Progress Notes (Signed)
Please let patient know CXR showed no acute cardiopulmonary process. She has some chronic lung changes, should have an updated HRCT chest to evaluate for ILD. Does not need to be done before surgery. Still ok for clearance.

## 2021-05-22 ENCOUNTER — Observation Stay (HOSPITAL_COMMUNITY)
Admission: RE | Admit: 2021-05-22 | Discharge: 2021-05-23 | Disposition: A | Discharge status: Home / Self Care | Payer: Medicare Other | Attending: Orthopedic Surgery | Admitting: Orthopedic Surgery

## 2021-05-22 ENCOUNTER — Ambulatory Visit (HOSPITAL_COMMUNITY): Payer: Medicare Other | Admitting: Physician Assistant

## 2021-05-22 ENCOUNTER — Encounter (HOSPITAL_COMMUNITY)
Admission: RE | Disposition: A | Discharge status: Home / Self Care | Payer: Self-pay | Source: Home / Self Care | Attending: Orthopedic Surgery

## 2021-05-22 ENCOUNTER — Encounter (HOSPITAL_COMMUNITY): Payer: Self-pay | Admitting: Orthopedic Surgery

## 2021-05-22 ENCOUNTER — Telehealth (INDEPENDENT_AMBULATORY_CARE_PROVIDER_SITE_OTHER): Payer: Medicare Other | Admitting: Family Medicine

## 2021-05-22 DIAGNOSIS — Z7982 Long term (current) use of aspirin: Secondary | ICD-10-CM | POA: Insufficient documentation

## 2021-05-22 DIAGNOSIS — I110 Hypertensive heart disease with heart failure: Secondary | ICD-10-CM | POA: Insufficient documentation

## 2021-05-22 DIAGNOSIS — Z96652 Presence of left artificial knee joint: Secondary | ICD-10-CM | POA: Insufficient documentation

## 2021-05-22 DIAGNOSIS — E039 Hypothyroidism, unspecified: Secondary | ICD-10-CM | POA: Diagnosis not present

## 2021-05-22 DIAGNOSIS — Z853 Personal history of malignant neoplasm of breast: Secondary | ICD-10-CM | POA: Diagnosis not present

## 2021-05-22 DIAGNOSIS — I509 Heart failure, unspecified: Secondary | ICD-10-CM | POA: Insufficient documentation

## 2021-05-22 DIAGNOSIS — Z96659 Presence of unspecified artificial knee joint: Secondary | ICD-10-CM

## 2021-05-22 DIAGNOSIS — Z8616 Personal history of COVID-19: Secondary | ICD-10-CM | POA: Diagnosis not present

## 2021-05-22 DIAGNOSIS — Z95810 Presence of automatic (implantable) cardiac defibrillator: Secondary | ICD-10-CM | POA: Diagnosis not present

## 2021-05-22 DIAGNOSIS — M1711 Unilateral primary osteoarthritis, right knee: Principal | ICD-10-CM | POA: Insufficient documentation

## 2021-05-22 DIAGNOSIS — Z87891 Personal history of nicotine dependence: Secondary | ICD-10-CM | POA: Insufficient documentation

## 2021-05-22 DIAGNOSIS — Z79899 Other long term (current) drug therapy: Secondary | ICD-10-CM | POA: Insufficient documentation

## 2021-05-22 HISTORY — PX: TOTAL KNEE ARTHROPLASTY: SHX125

## 2021-05-22 SURGERY — ARTHROPLASTY, KNEE, TOTAL
Anesthesia: Regional | Site: Knee | Laterality: Right

## 2021-05-22 MED ORDER — CARVEDILOL 12.5 MG PO TABS
12.5000 mg | ORAL_TABLET | Freq: Two times a day (BID) | ORAL | Status: DC
  Administered 2021-05-22 – 2021-05-23 (×2): 12.5 mg via ORAL
  Filled 2021-05-22 (×2): qty 1

## 2021-05-22 MED ORDER — FERROUS SULFATE 325 (65 FE) MG PO TABS
325.0000 mg | ORAL_TABLET | Freq: Three times a day (TID) | ORAL | Status: DC
  Administered 2021-05-23 (×2): 325 mg via ORAL
  Filled 2021-05-22 (×2): qty 1

## 2021-05-22 MED ORDER — TRAMADOL HCL 50 MG PO TABS
50.0000 mg | ORAL_TABLET | Freq: Four times a day (QID) | ORAL | Status: DC
Start: 2021-05-23 — End: 2021-05-23
  Administered 2021-05-22 – 2021-05-23 (×3): 50 mg via ORAL
  Filled 2021-05-22 (×3): qty 1

## 2021-05-22 MED ORDER — SODIUM CHLORIDE 0.9 % IV SOLN: INTRAVENOUS | Status: DC

## 2021-05-22 MED ORDER — BUPIVACAINE-EPINEPHRINE 0.25% -1:200000 IJ SOLN
INTRAMUSCULAR | Status: DC | PRN
  Administered 2021-05-22: 30 mL

## 2021-05-22 MED ORDER — MAGNESIUM OXIDE -MG SUPPLEMENT 400 (240 MG) MG PO TABS
400.0000 mg | ORAL_TABLET | Freq: Two times a day (BID) | ORAL | Status: DC
  Administered 2021-05-22 – 2021-05-23 (×2): 400 mg via ORAL
  Filled 2021-05-22 (×2): qty 1

## 2021-05-22 MED ORDER — ORAL CARE MOUTH RINSE: 15.0000 mL | Freq: Once | OROMUCOSAL | Status: AC

## 2021-05-22 MED ORDER — MENTHOL 3 MG MT LOZG: 1.0000 | LOZENGE | OROMUCOSAL | Status: DC | PRN

## 2021-05-22 MED ORDER — FUROSEMIDE 20 MG PO TABS: 20.0000 mg | ORAL_TABLET | ORAL | Status: DC | PRN

## 2021-05-22 MED ORDER — CEFAZOLIN SODIUM-DEXTROSE 2-4 GM/100ML-% IV SOLN
2.0000 g | INTRAVENOUS | Status: AC
  Administered 2021-05-22: 2 g via INTRAVENOUS
  Filled 2021-05-22: qty 100

## 2021-05-22 MED ORDER — METHOCARBAMOL 500 MG PO TABS: 500.0000 mg | ORAL_TABLET | Freq: Four times a day (QID) | ORAL | Status: DC | PRN

## 2021-05-22 MED ORDER — PROPOFOL 500 MG/50ML IV EMUL
INTRAVENOUS | Status: DC | PRN
  Administered 2021-05-22: 100 ug/kg/min via INTRAVENOUS

## 2021-05-22 MED ORDER — BUPIVACAINE-EPINEPHRINE (PF) 0.25% -1:200000 IJ SOLN
INTRAMUSCULAR | Status: AC
  Filled 2021-05-22: qty 30

## 2021-05-22 MED ORDER — SODIUM CHLORIDE 0.9% FLUSH
INTRAVENOUS | Status: DC | PRN
  Administered 2021-05-22: 20 mL

## 2021-05-22 MED ORDER — PANTOPRAZOLE SODIUM 40 MG PO TBEC: 40.0000 mg | DELAYED_RELEASE_TABLET | ORAL | Status: DC

## 2021-05-22 MED ORDER — SENNOSIDES-DOCUSATE SODIUM 8.6-50 MG PO TABS: 1.0000 | ORAL_TABLET | Freq: Every evening | ORAL | Status: DC | PRN

## 2021-05-22 MED ORDER — OXYCODONE HCL 5 MG/5ML PO SOLN: 5.0000 mg | Freq: Once | ORAL | Status: DC | PRN

## 2021-05-22 MED ORDER — TRAZODONE HCL 50 MG PO TABS: 50.0000 mg | ORAL_TABLET | Freq: Every evening | ORAL | Status: DC | PRN

## 2021-05-22 MED ORDER — MONTELUKAST SODIUM 10 MG PO TABS
10.0000 mg | ORAL_TABLET | Freq: Every day | ORAL | Status: DC
  Administered 2021-05-22: 10 mg via ORAL
  Filled 2021-05-22: qty 1

## 2021-05-22 MED ORDER — ZOLPIDEM TARTRATE 5 MG PO TABS
5.0000 mg | ORAL_TABLET | Freq: Every evening | ORAL | Status: DC | PRN
  Administered 2021-05-23: 5 mg via ORAL
  Filled 2021-05-22: qty 1

## 2021-05-22 MED ORDER — FLUTICASONE PROPIONATE 50 MCG/ACT NA SUSP
2.0000 | Freq: Every day | NASAL | Status: DC
  Administered 2021-05-23: 2 via NASAL
  Filled 2021-05-22: qty 16

## 2021-05-22 MED ORDER — FENTANYL CITRATE PF 50 MCG/ML IJ SOSY
50.0000 ug | PREFILLED_SYRINGE | INTRAMUSCULAR | Status: DC
  Administered 2021-05-22: 50 ug via INTRAVENOUS
  Filled 2021-05-22: qty 2

## 2021-05-22 MED ORDER — FLEET ENEMA 7-19 GM/118ML RE ENEM: 1.0000 | ENEMA | Freq: Once | RECTAL | Status: DC | PRN

## 2021-05-22 MED ORDER — 0.9 % SODIUM CHLORIDE (POUR BTL) OPTIME
TOPICAL | Status: DC | PRN
  Administered 2021-05-22: 1000 mL

## 2021-05-22 MED ORDER — SODIUM CHLORIDE (PF) 0.9 % IJ SOLN
INTRAMUSCULAR | Status: AC
  Filled 2021-05-22: qty 20

## 2021-05-22 MED ORDER — MIDAZOLAM HCL 2 MG/2ML IJ SOLN
1.0000 mg | INTRAMUSCULAR | Status: DC
  Filled 2021-05-22: qty 2

## 2021-05-22 MED ORDER — BUPIVACAINE LIPOSOME 1.3 % IJ SUSP: 20.0000 mL | Freq: Once | INTRAMUSCULAR | Status: DC

## 2021-05-22 MED ORDER — DEXAMETHASONE SODIUM PHOSPHATE 10 MG/ML IJ SOLN
INTRAMUSCULAR | Status: DC | PRN
  Administered 2021-05-22: 8 mg via INTRAVENOUS

## 2021-05-22 MED ORDER — OXYCODONE HCL 5 MG PO TABS
5.0000 mg | ORAL_TABLET | ORAL | Status: DC | PRN
  Administered 2021-05-22: 5 mg via ORAL
  Administered 2021-05-23 (×2): 10 mg via ORAL
  Filled 2021-05-22 (×2): qty 2

## 2021-05-22 MED ORDER — BUPIVACAINE LIPOSOME 1.3 % IJ SUSP
INTRAMUSCULAR | Status: AC
  Filled 2021-05-22: qty 20

## 2021-05-22 MED ORDER — POTASSIUM CHLORIDE CRYS ER 10 MEQ PO TBCR: 5.0000 meq | EXTENDED_RELEASE_TABLET | Freq: Every day | ORAL | Status: DC | PRN

## 2021-05-22 MED ORDER — LIDOCAINE HCL (PF) 2 % IJ SOLN
INTRAMUSCULAR | Status: AC
  Filled 2021-05-22: qty 5

## 2021-05-22 MED ORDER — SODIUM CHLORIDE 0.9 % IR SOLN
Status: DC | PRN
  Administered 2021-05-22: 1000 mL

## 2021-05-22 MED ORDER — ACETAMINOPHEN 500 MG PO TABS
1000.0000 mg | ORAL_TABLET | Freq: Once | ORAL | Status: AC
  Administered 2021-05-22: 1000 mg via ORAL
  Filled 2021-05-22: qty 2

## 2021-05-22 MED ORDER — OXYCODONE HCL 5 MG PO TABS
ORAL_TABLET | ORAL | Status: AC
  Filled 2021-05-22: qty 1

## 2021-05-22 MED ORDER — CHLORHEXIDINE GLUCONATE CLOTH 2 % EX PADS: 6.0000 | MEDICATED_PAD | Freq: Every day | CUTANEOUS | Status: DC

## 2021-05-22 MED ORDER — LATANOPROST 0.005 % OP SOLN
1.0000 [drp] | Freq: Every day | OPHTHALMIC | Status: DC
  Administered 2021-05-22: 1 [drp] via OPHTHALMIC
  Filled 2021-05-22: qty 2.5

## 2021-05-22 MED ORDER — PHENOL 1.4 % MT LIQD: 1.0000 | OROMUCOSAL | Status: DC | PRN

## 2021-05-22 MED ORDER — BUPIVACAINE LIPOSOME 1.3 % IJ SUSP
INTRAMUSCULAR | Status: DC | PRN
  Administered 2021-05-22: 20 mL

## 2021-05-22 MED ORDER — GABAPENTIN 300 MG PO CAPS
300.0000 mg | ORAL_CAPSULE | Freq: Once | ORAL | Status: AC
  Administered 2021-05-22: 300 mg via ORAL
  Filled 2021-05-22: qty 1

## 2021-05-22 MED ORDER — ONDANSETRON HCL 4 MG/2ML IJ SOLN
INTRAMUSCULAR | Status: DC | PRN
  Administered 2021-05-22: 4 mg via INTRAVENOUS

## 2021-05-22 MED ORDER — METHOCARBAMOL 500 MG IVPB - SIMPLE MED
INTRAVENOUS | Status: AC
  Administered 2021-05-22: 500 mg
  Filled 2021-05-22: qty 50

## 2021-05-22 MED ORDER — LACTATED RINGERS IV SOLN: INTRAVENOUS | Status: DC

## 2021-05-22 MED ORDER — EZETIMIBE 10 MG PO TABS: 10.0000 mg | ORAL_TABLET | Freq: Every day | ORAL | Status: DC

## 2021-05-22 MED ORDER — SODIUM CHLORIDE (PF) 0.9 % IJ SOLN
INTRAMUSCULAR | Status: AC
  Filled 2021-05-22: qty 10

## 2021-05-22 MED ORDER — FENTANYL CITRATE PF 50 MCG/ML IJ SOSY
25.0000 ug | PREFILLED_SYRINGE | INTRAMUSCULAR | Status: DC | PRN
  Administered 2021-05-22 (×3): 50 ug via INTRAVENOUS

## 2021-05-22 MED ORDER — PHENYLEPHRINE HCL-NACL 20-0.9 MG/250ML-% IV SOLN
INTRAVENOUS | Status: DC | PRN
  Administered 2021-05-22: 50 ug/min via INTRAVENOUS

## 2021-05-22 MED ORDER — POVIDONE-IODINE 10 % EX SWAB: 2.0000 "application " | Freq: Once | CUTANEOUS | Status: DC

## 2021-05-22 MED ORDER — OXYCODONE HCL 5 MG PO TABS
ORAL_TABLET | ORAL | Status: AC
  Administered 2021-05-22: 5 mg via ORAL
  Filled 2021-05-22: qty 1

## 2021-05-22 MED ORDER — PHENYLEPHRINE HCL (PRESSORS) 10 MG/ML IV SOLN
INTRAVENOUS | Status: AC
  Filled 2021-05-22: qty 2

## 2021-05-22 MED ORDER — BISACODYL 5 MG PO TBEC: 5.0000 mg | DELAYED_RELEASE_TABLET | Freq: Every day | ORAL | Status: DC | PRN

## 2021-05-22 MED ORDER — ONDANSETRON HCL 4 MG/2ML IJ SOLN: 4.0000 mg | Freq: Four times a day (QID) | INTRAMUSCULAR | Status: DC | PRN

## 2021-05-22 MED ORDER — METHOCARBAMOL 500 MG IVPB - SIMPLE MED
500.0000 mg | Freq: Four times a day (QID) | INTRAVENOUS | Status: DC | PRN
  Filled 2021-05-22: qty 50

## 2021-05-22 MED ORDER — PROPOFOL 10 MG/ML IV BOLUS
INTRAVENOUS | Status: DC | PRN
  Administered 2021-05-22: 30 mg via INTRAVENOUS
  Administered 2021-05-22: 20 mg via INTRAVENOUS

## 2021-05-22 MED ORDER — METOCLOPRAMIDE HCL 5 MG/ML IJ SOLN: 5.0000 mg | Freq: Three times a day (TID) | INTRAMUSCULAR | Status: DC | PRN

## 2021-05-22 MED ORDER — HYDROMORPHONE HCL 1 MG/ML IJ SOLN: 0.5000 mg | INTRAMUSCULAR | Status: DC | PRN

## 2021-05-22 MED ORDER — ONDANSETRON HCL 4 MG/2ML IJ SOLN: 4.0000 mg | Freq: Once | INTRAMUSCULAR | Status: DC | PRN

## 2021-05-22 MED ORDER — DIPHENHYDRAMINE HCL 12.5 MG/5ML PO ELIX: 12.5000 mg | ORAL_SOLUTION | ORAL | Status: DC | PRN

## 2021-05-22 MED ORDER — ALUM & MAG HYDROXIDE-SIMETH 200-200-20 MG/5ML PO SUSP: 30.0000 mL | ORAL | Status: DC | PRN

## 2021-05-22 MED ORDER — PANTOPRAZOLE SODIUM 40 MG PO TBEC
40.0000 mg | DELAYED_RELEASE_TABLET | Freq: Every day | ORAL | Status: DC
  Administered 2021-05-23: 40 mg via ORAL
  Filled 2021-05-22: qty 1

## 2021-05-22 MED ORDER — TRANEXAMIC ACID-NACL 1000-0.7 MG/100ML-% IV SOLN
1000.0000 mg | INTRAVENOUS | Status: AC
  Administered 2021-05-22: 1000 mg via INTRAVENOUS
  Filled 2021-05-22: qty 100

## 2021-05-22 MED ORDER — CHLORHEXIDINE GLUCONATE 0.12 % MT SOLN
15.0000 mL | Freq: Once | OROMUCOSAL | Status: AC
  Administered 2021-05-22: 15 mL via OROMUCOSAL

## 2021-05-22 MED ORDER — OXYCODONE HCL 5 MG PO TABS: 5.0000 mg | ORAL_TABLET | Freq: Once | ORAL | Status: DC | PRN

## 2021-05-22 MED ORDER — DEXAMETHASONE SODIUM PHOSPHATE 10 MG/ML IJ SOLN
10.0000 mg | Freq: Once | INTRAMUSCULAR | Status: AC
  Administered 2021-05-23: 10 mg via INTRAVENOUS
  Filled 2021-05-22: qty 1

## 2021-05-22 MED ORDER — METOCLOPRAMIDE HCL 5 MG PO TABS: 5.0000 mg | ORAL_TABLET | Freq: Three times a day (TID) | ORAL | Status: DC | PRN

## 2021-05-22 MED ORDER — ACETAMINOPHEN 500 MG PO TABS
1000.0000 mg | ORAL_TABLET | Freq: Four times a day (QID) | ORAL | Status: DC
  Administered 2021-05-22 – 2021-05-23 (×3): 1000 mg via ORAL
  Filled 2021-05-22 (×3): qty 2

## 2021-05-22 MED ORDER — ALBUTEROL SULFATE HFA 108 (90 BASE) MCG/ACT IN AERS: 2.0000 | INHALATION_SPRAY | Freq: Four times a day (QID) | RESPIRATORY_TRACT | Status: DC | PRN

## 2021-05-22 MED ORDER — FENTANYL CITRATE PF 50 MCG/ML IJ SOSY
PREFILLED_SYRINGE | INTRAMUSCULAR | Status: AC
  Administered 2021-05-22: 50 ug via INTRAVENOUS
  Filled 2021-05-22: qty 3

## 2021-05-22 MED ORDER — DEXAMETHASONE SODIUM PHOSPHATE 10 MG/ML IJ SOLN: 8.0000 mg | Freq: Once | INTRAMUSCULAR | Status: DC

## 2021-05-22 MED ORDER — DOCUSATE SODIUM 100 MG PO CAPS
100.0000 mg | ORAL_CAPSULE | Freq: Two times a day (BID) | ORAL | Status: DC
  Administered 2021-05-22 – 2021-05-23 (×2): 100 mg via ORAL
  Filled 2021-05-22 (×2): qty 1

## 2021-05-22 MED ORDER — ONDANSETRON HCL 4 MG PO TABS: 4.0000 mg | ORAL_TABLET | Freq: Four times a day (QID) | ORAL | Status: DC | PRN

## 2021-05-22 MED ORDER — POLYVINYL ALCOHOL 1.4 % OP SOLN
1.0000 [drp] | Freq: Every day | OPHTHALMIC | Status: DC
  Administered 2021-05-22: 1 [drp] via OPHTHALMIC
  Filled 2021-05-22: qty 15

## 2021-05-22 MED ORDER — ASPIRIN EC 325 MG PO TBEC
325.0000 mg | DELAYED_RELEASE_TABLET | Freq: Two times a day (BID) | ORAL | Status: DC
  Administered 2021-05-23: 325 mg via ORAL
  Filled 2021-05-22: qty 1

## 2021-05-22 MED ORDER — WATER FOR IRRIGATION, STERILE IR SOLN
Status: DC | PRN
  Administered 2021-05-22: 2000 mL

## 2021-05-22 MED ORDER — LORATADINE 10 MG PO TABS
10.0000 mg | ORAL_TABLET | Freq: Every day | ORAL | Status: DC
  Administered 2021-05-23: 10 mg via ORAL
  Filled 2021-05-22: qty 1

## 2021-05-22 MED ORDER — LEVOTHYROXINE SODIUM 50 MCG PO TABS
50.0000 ug | ORAL_TABLET | Freq: Every day | ORAL | Status: DC
  Administered 2021-05-23: 50 ug via ORAL
  Filled 2021-05-22: qty 1

## 2021-05-22 MED ORDER — DROPERIDOL 2.5 MG/ML IJ SOLN: 0.6250 mg | Freq: Once | INTRAMUSCULAR | Status: DC | PRN

## 2021-05-22 MED ORDER — BUPIVACAINE HCL (PF) 0.5 % IJ SOLN
INTRAMUSCULAR | Status: DC | PRN
  Administered 2021-05-22: 30 mL

## 2021-05-22 MED ORDER — BUPIVACAINE IN DEXTROSE 0.75-8.25 % IT SOLN
INTRATHECAL | Status: DC | PRN
  Administered 2021-05-22: 1.6 mL via INTRATHECAL

## 2021-05-22 SURGICAL SUPPLY — 61 items
ARTISURF 11M PLY R 6-9CD KNEE (Knees) ×1 IMPLANT
BAG COUNTER SPONGE SURGICOUNT (BAG) IMPLANT
BAG SPEC THK2 15X12 ZIP CLS (MISCELLANEOUS) ×1
BAG SPNG CNTER NS LX DISP (BAG)
BAG ZIPLOCK 12X15 (MISCELLANEOUS) ×2 IMPLANT
BLADE SAGITTAL 13X1.27X60 (BLADE) ×2 IMPLANT
BLADE SAW SGTL 18X1.27X75 (BLADE) ×2 IMPLANT
BLADE SURG 15 STRL LF DISP TIS (BLADE) ×1 IMPLANT
BLADE SURG 15 STRL SS (BLADE) ×2
BLADE SURG SZ10 CARB STEEL (BLADE) ×4 IMPLANT
BNDG ELASTIC 6X5.8 VLCR STR LF (GAUZE/BANDAGES/DRESSINGS) ×2 IMPLANT
BOWL SMART MIX CTS (DISPOSABLE) ×2 IMPLANT
BSPLAT TIB 5D D CMNT STM RT (Knees) ×1 IMPLANT
CATH FOLEY 2WAY SLVR  5CC 14FR (CATHETERS) ×2
CATH FOLEY 2WAY SLVR 5CC 14FR (CATHETERS) IMPLANT
CEMENT BONE SIMPLEX SPEEDSET (Cement) ×3 IMPLANT
COVER SURGICAL LIGHT HANDLE (MISCELLANEOUS) ×2 IMPLANT
CUFF TOURN SGL QUICK 34 (TOURNIQUET CUFF) ×2
CUFF TRNQT CYL 34X4.125X (TOURNIQUET CUFF) ×1 IMPLANT
DECANTER SPIKE VIAL GLASS SM (MISCELLANEOUS) ×4 IMPLANT
DRAPE INCISE IOBAN 66X45 STRL (DRAPES) ×10 IMPLANT
DRAPE U-SHAPE 47X51 STRL (DRAPES) ×2 IMPLANT
DRSG AQUACEL AG ADV 3.5X10 (GAUZE/BANDAGES/DRESSINGS) ×2 IMPLANT
DURAPREP 26ML APPLICATOR (WOUND CARE) ×4 IMPLANT
ELECT REM PT RETURN 15FT ADLT (MISCELLANEOUS) ×2 IMPLANT
FEMUR  CMT CCR STD SZ7 R KNEE (Knees) ×2 IMPLANT
FEMUR CMT CCR STD SZ7 R KNEE (Knees) ×1 IMPLANT
FEMUR CMTD CCR STD SZ7 R KNEE (Knees) IMPLANT
GLOVE SURG ENC TEXT LTX SZ7.5 (GLOVE) ×2 IMPLANT
GLOVE SURG ORTHO LTX SZ8 (GLOVE) ×3 IMPLANT
GLOVE SURG POLYISO LF SZ8 (GLOVE) ×2 IMPLANT
GLOVE SURG UNDER POLY LF SZ7.5 (GLOVE) ×2 IMPLANT
GLOVE SURG UNDER POLY LF SZ8.5 (GLOVE) ×4 IMPLANT
GOWN STRL REUS W/ TWL XL LVL3 (GOWN DISPOSABLE) ×2 IMPLANT
GOWN STRL REUS W/TWL XL LVL3 (GOWN DISPOSABLE) ×4
HANDPIECE INTERPULSE COAX TIP (DISPOSABLE) ×2
HOLDER FOLEY CATH W/STRAP (MISCELLANEOUS) ×2 IMPLANT
HOOD PEEL AWAY FLYTE STAYCOOL (MISCELLANEOUS) ×6 IMPLANT
KIT TURNOVER KIT A (KITS) ×2 IMPLANT
MANIFOLD NEPTUNE II (INSTRUMENTS) ×2 IMPLANT
NDL HYPO 21X1.5 SAFETY (NEEDLE) ×1 IMPLANT
NEEDLE HYPO 21X1.5 SAFETY (NEEDLE) ×2 IMPLANT
NS IRRIG 1000ML POUR BTL (IV SOLUTION) ×2 IMPLANT
PACK TOTAL KNEE CUSTOM (KITS) ×2 IMPLANT
PROTECTOR NERVE ULNAR (MISCELLANEOUS) ×2 IMPLANT
SET HNDPC FAN SPRY TIP SCT (DISPOSABLE) ×1 IMPLANT
STEM POLY PAT PLY 32M KNEE (Knees) ×1 IMPLANT
STEM TIBIA 5 DEG SZ D R KNEE (Knees) IMPLANT
STRIP CLOSURE SKIN 1/2X4 (GAUZE/BANDAGES/DRESSINGS) ×3 IMPLANT
SUT BONE WAX W31G (SUTURE) ×2 IMPLANT
SUT MNCRL AB 3-0 PS2 18 (SUTURE) ×2 IMPLANT
SUT STRATAFIX 0 PDS 27 VIOLET (SUTURE) ×2
SUT STRATAFIX PDS+ 0 24IN (SUTURE) ×2 IMPLANT
SUT VIC AB 1 CT1 36 (SUTURE) ×2 IMPLANT
SUTURE STRATFX 0 PDS 27 VIOLET (SUTURE) ×1 IMPLANT
SYR 30ML LL (SYRINGE) ×4 IMPLANT
TIBIA STEM 5 DEG SZ D R KNEE (Knees) ×2 IMPLANT
TRAY FOLEY MTR SLVR 14FR STAT (SET/KITS/TRAYS/PACK) ×1 IMPLANT
TRAY FOLEY MTR SLVR 16FR STAT (SET/KITS/TRAYS/PACK) ×1 IMPLANT
WATER STERILE IRR 1000ML POUR (IV SOLUTION) ×4 IMPLANT
WRAP KNEE MAXI GEL POST OP (GAUZE/BANDAGES/DRESSINGS) ×2 IMPLANT

## 2021-05-22 NOTE — Anesthesia Procedure Notes (Signed)
Spinal  Patient location during procedure: OR Start time: 05/22/2021 2:45 PM End time: 05/22/2021 2:49 PM Reason for block: surgical anesthesia Staffing Performed: resident/CRNA  Anesthesiologist: Merlinda Frederick, MD Resident/CRNA: Genelle Bal, CRNA Preanesthetic Checklist Completed: patient identified, IV checked, site marked, risks and benefits discussed, surgical consent, monitors and equipment checked, pre-op evaluation and timeout performed Spinal Block Patient position: sitting Prep: DuraPrep Patient monitoring: heart rate, cardiac monitor, continuous pulse ox and blood pressure Approach: midline Location: L3-4 Injection technique: single-shot Needle Needle type: Sprotte  Needle gauge: 24 G Needle length: 9 cm Assessment Sensory level: T4 Events: CSF return Additional Notes Pt sitting position. Spinal attempt x1, + clear, free-flowing CSF, easy to aspirate/inject, - paresthesia/heme. Level to T6.

## 2021-05-22 NOTE — Progress Notes (Signed)
LMOM for patient to call office back. Call back information provided.

## 2021-05-22 NOTE — Transfer of Care (Signed)
Immediate Anesthesia Transfer of Care Note  Patient: [name]  Procedure(s) Performed: TOTAL KNEE ARTHROPLASTY (Right: Knee)  Patient Location: PACU  Anesthesia Type:Spinal and MAC combined with regional for post-op pain  Level of Consciousness: awake, alert  and oriented  Airway & Oxygen Therapy: Patient Spontanous Breathing and Patient connected to face mask oxygen  Post-op Assessment: Report given to RN and Post -op Vital signs reviewed and stable  Post vital signs: Reviewed and stable  Last Vitals:  Vitals Value Taken Time  BP 137/67 05/22/21 1625  Temp    Pulse 66 05/22/21 1627  Resp 15 05/22/21 1627  SpO2 100 % 05/22/21 1627  Vitals shown include unvalidated device data.  Last Pain:  Vitals:   05/22/21 1015  TempSrc:   PainSc: 0-No pain      Patients Stated Pain Goal: 3 (07/37/10 6269)  Complications: No notable events documented.

## 2021-05-22 NOTE — Progress Notes (Signed)
Orthopedic Tech Progress Note Patient Details:  [name] April 25, 1951 [id number]  CPM Right Knee CPM Right Knee: On Right Knee Flexion (Degrees): 90 Right Knee Extension (Degrees): 0  Post Interventions Patient Tolerated: Well Instructions Provided: Care of device  [name] 05/22/2021, 4:35 PM

## 2021-05-22 NOTE — Progress Notes (Signed)
Assisted Dr. Elgie Congo with right, ultrasound guided, adductor canal block. Side rails up, monitors on throughout procedure. See vital signs in flow sheet. Tolerated Procedure well.

## 2021-05-22 NOTE — Anesthesia Procedure Notes (Signed)
Anesthesia Regional Block: Popliteal block   Pre-Anesthetic Checklist: , timeout performed,  Correct Patient, Correct Site, Correct Laterality,  Correct Procedure, Correct Position, site marked,  Risks and benefits discussed,  Surgical consent,  Pre-op evaluation,  At surgeon's request and post-op pain management  Laterality: Left  Prep: chloraprep       Needles:  Injection technique: Single-shot  Needle Type: Echogenic Stimulator Needle     Needle Length: 10cm  Needle Gauge: 20     Additional Needles:   Procedures:,,,, ultrasound used (permanent image in chart),,    Narrative:  Start time: 05/22/2021 10:00 AM End time: 05/22/2021 10:05 AM Injection made incrementally with aspirations every 5 mL.  Performed by: Personally  Anesthesiologist: Merlinda Frederick, MD  Additional Notes: A functioning IV was confirmed and monitors were applied.  Sterile prep and drape, hand hygiene and sterile gloves were used.  Negative aspiration and test dose prior to incremental administration of local anesthetic. The patient tolerated the procedure well.Ultrasound  guidance: relevant anatomy identified, needle position confirmed, local anesthetic spread visualized around nerve(s), vascular puncture avoided.  Image printed for medical record.

## 2021-05-22 NOTE — Op Note (Addendum)
TOTAL KNEE REPLACEMENT OPERATIVE NOTE:  05/22/2021  4:21 PM  PATIENT:  [name]  70 y.o. female  PRE-OPERATIVE DIAGNOSIS:  Osteoarthritis of right knee M17.11  POST-OPERATIVE DIAGNOSIS:  Osteoarthritis of right knee M17.11  PROCEDURE:  Procedure(s): TOTAL KNEE ARTHROPLASTY  SURGEON:  Surgeon(s): Vickey Huger, MD  PHYSICIAN ASSISTANT: Carlyon Shadow, PA-C   ANESTHESIA:   spinal  SPECIMEN: None  COUNTS:  Correct  TOURNIQUET:   Total Tourniquet Time Documented: Thigh (Right) - 41 minutes Total: Thigh (Right) - 41 minutes   DICTATION:  Indication for procedure:    The patient is a 70 y.o. female who has failed conservative treatment for Osteoarthritis of right knee M17.11.  Informed consent was obtained prior to anesthesia. The risks versus benefits of the operation were explain and in a way the patient can, and did, understand.    Description of procedure:     The patient was taken to the operating room and placed under anesthesia.  The patient was positioned in the usual fashion taking care that all body parts were adequately padded and/or protected.  A tourniquet was applied and the leg prepped and draped in the usual sterile fashion.  The extremity was exsanguinated with the esmarch and tourniquet inflated to 250 mmHg.  Pre-operative range of motion was normal.   A midline incision approximately 6-7 inches long was made with a #10 blade.  A new blade was used to make a parapatellar arthrotomy going 2-3 cm into the quadriceps tendon, over the patella, and alongside the medial aspect of the patellar tendon.  A synovectomy was then performed with the #10 blade and forceps. I then elevated the deep MCL off the medial tibial metaphysis subperiosteally around to the semimembranosus attachment.    I everted the patella and used calipers to measure patellar thickness.  I used the reamer to ream down to appropriate thickness to recreate the native thickness.  I then  removed excess bone with the rongeur and sagittal saw.  I used the appropriately sized template and drilled the three lug holes.  I then put the trial in place and measured the thickness with the calipers to ensure recreation of the native thickness.  The trial was then removed and the patella subluxed and the knee brought into flexion.  A homan retractor was place to retract and protect the patella and lateral structures.  A Z-retractor was place medially to protect the medial structures.  The extra-medullary alignment system was used to make cut the tibial articular surface perpendicular to the anamotic axis of the tibia and in 3 degrees of posterior slope.  The cut surface and alignment jig was removed.  I then used the intramedullary alignment guide to make a valgus cut on the distal femur.  I then marked out the epicondylar axis on the distal femur.    I then used the anterior referencing sizer and measured the femur to be a size 7.  The 4-In-1 cutting block was screwed into place in external rotation matching the posterior condylar angle, making our cuts perpendicular to the epicondylar axis.  Anterior, posterior and chamfer cuts were made with the sagittal saw.  The cutting block and cut pieces were removed.  A lamina spreader was placed in 90 degrees of flexion.  The ACL, PCL, menisci, and posterior condylar osteophytes were removed.  A 11 mm spacer blocked was found to offer good flexion and extension gap balance after minimal in degree releasing.   The scoop retractor was then  placed and the femoral finishing block was pinned in place.  The small sagittal saw was used as well as the lug drill to finish the femur.  The block and cut surfaces were removed and the medullary canal hole filled with autograft bone from the cut pieces.  The tibia was delivered forward in deep flexion and external rotation.  A size D tray was selected and pinned into place centered on the medial 1/3 of the tibial tubercle.   The reamer and keel was used to prepare the tibia through the tray.    I then trialed with the size 7 femur, size D tibia, a 11 mm insert and the 32 patella.  I had excellent flexion/extension gap balance, excellent patella tracking.  Flexion was full and beyond 120 degrees; extension was zero.  These components were chosen and the staff opened them to me on the back table while the knee was lavaged copiously and the cement mixed.  The soft tissue was infiltrated with 60cc of exparel 1.3% through a 21 gauge needle.  I cemented in the components and removed all excess cement.  The polyethylene tibial component was snapped into place and the knee placed in extension while cement was hardening.  The capsule was infilltrated with a 60cc exparel/marcaine/saline mixture.   Once the cement was hard, the tourniquet was let down.  Hemostasis was obtained.  The arthrotomy was closed using a #1 stratofix running suture.  The deep soft tissues were closed with #0 vicryls and the subcuticular layer closed with #2-0 vicryl.  The skin was reapproximated and closed with 3.0 Monocryl.  The wound was covered with steristrips, aquacel dressing, and a TED stocking.   The patient was then awakened, extubated, and taken to the recovery room in stable condition.  BLOOD LOSS:  355DD COMPLICATIONS:  None.  PLAN OF CARE: Admit for overnight observation  PATIENT DISPOSITION:  PACU - hemodynamically stable.     Please fax a copy of this op note to my office at [phone] (please only include page 1 and 2 of the Case Information op note)

## 2021-05-22 NOTE — H&P (Signed)
Lekisha Mcghee MRN:  [id number] DOB/SEX:  03-May-1951/female  CHIEF COMPLAINT:  Painful right Knee  HISTORY: Patient is a 70 y.o. female presented with a history of pain in the right knee. Onset of symptoms was gradual starting a few years ago with gradually worsening course since that time. Patient has been treated conservatively with over-the-counter NSAIDs and activity modification. Patient currently rates pain in the knee at 10 out of 10 with activity. There is pain at night.  PAST MEDICAL HISTORY: Patient Active Problem List   Diagnosis Date Noted   Pre-operative respiratory examination 05/18/2021   Statin myopathy 04/19/2021   Pain in joint of right shoulder 02/10/2021   Arthritis of both acromioclavicular joints 01/02/2021   Trochanteric bursitis of right hip 10/03/2020   Tailbone injury 05/05/2020   UTI (urinary tract infection) 05/05/2020   Insomnia 05/05/2020   Leg cramps 02/16/2020   Dysuria 02/16/2020   Right hip impingement syndrome 02/01/2020   Right bicipital tenosynovitis 02/01/2020   Bronchiectasis without complication ([place]) 32/20/2542   Myofascial pain 12/21/2019   Estrogen deficiency 12/10/2019   Obesity (BMI 30-39.9) 12/10/2019   Bronchiectasis with (acute) exacerbation ([place]) 10/23/2019   Elevated LFTs 10/15/2019   Lower abdominal pain 09/01/2019   Neck pain 10/25/2017   Paresthesias in left hand 10/25/2017   Paresthesia of foot, bilateral 04/12/2017   Primary osteoarthritis of both hands 09/27/2016   Primary osteoarthritis of both knees 09/27/2016   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   Nasopharyngitis, 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/2016   S/P total knee replacement 02/13/2016   Chronic pain of both knees 12/06/2015   Globus pharyngeus 05/17/2015   Genetic testing 12/23/2014   Family history of breast cancer    Family history of colon cancer    Family history of pancreatic cancer    History of breast cancer 11/23/2014   Allergy to ACE inhibitors  09/27/2014   Prediabetes 06/23/2013   RLS (restless legs syndrome) 01/15/2013   Hx of Clostridium difficile infection 10/10/2012   Dyspnea on exertion 04/02/2012   NICM (nonischemic cardiomyopathy) ([place]) 03/07/2012   Post-menopausal 01/11/2012   Obstructive sleep apnea 04/04/2010   V-tach 01/03/2009   ICD  [employer]  Single chamber 01/03/2009   PFO (patent foramen ovale) 09/29/2008   Vitamin D deficiency 08/17/2008   Hypothyroidism 11/18/2006   Hyperlipidemia 11/18/2006   Depression with anxiety 11/18/2006   Essential hypertension 11/18/2006   Mitral valve prolapse 11/18/2006   Cerebral aneurysm 11/18/2006   Allergic rhinitis 11/18/2006   GERD 11/18/2006   Diverticulosis of colon 11/18/2006   Fatty liver 11/18/2006   Fibromyalgia 11/18/2006   Past Medical History:  Diagnosis Date   AICD (automatic cardioverter/defibrillator) present    Anxiety    Arthritis    Back pain    Breast cancer ([place]) 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   Bronchiectasis ([place])    Cerebral aneurysm, nonruptured    had a clip put in   CHF (congestive heart failure) ([place])    Clostridium difficile infection    Depressive disorder, not elsewhere classified    Diverticulosis of colon (without mention of hemorrhage)    Esophageal reflux    Fatty liver    Fibromyalgia    Gastritis    GERD (gastroesophageal reflux disease)    GI bleed 2004   Glaucoma    Hiatal hernia    History of COVID-19 05/04/2021   Hyperlipidemia    Hypertension    Hypothyroidism    Internal hemorrhoids  Joint pain    Obstructive sleep apnea (adult) (pediatric)    Osteoarthritis    Ostium secundum type atrial septal defect    Other chronic nonalcoholic liver disease    Other pulmonary embolism and infarction    Palpitations    Paroxysmal ventricular tachycardia    Personal history of radiation therapy    PONV (postoperative nausea and vomiting)    Presence of permanent cardiac pacemaker    PUD (peptic  ulcer disease)    Radiation 02/03/15-03/10/15   Right Breast   Sarcoid    per pt , not sure   Schatzki's ring    Shortness of breath    Sleep apnea    Stroke [facility]) 2013   tia/ pt feels it was around 2008 0r 2009   Takotsubo syndrome    Tubular adenoma of colon    Unspecified transient cerebral ischemia    Unspecified vitamin D deficiency    Past Surgical History:  Procedure Laterality Date   ABI  2006   normal   BREAST EXCISIONAL BIOPSY     BREAST LUMPECTOMY WITH RADIOACTIVE SEED LOCALIZATION Right 01/03/2015   Procedure: BREAST LUMPECTOMY WITH RADIOACTIVE SEED LOCALIZATION;  Surgeon: Autumn Messing III, MD;  Location: [place];  Service: General;  Laterality: Right;   CARDIAC DEFIBRILLATOR PLACEMENT  2006; 2012   BSX single chamber ICD   carotid dopplers  2006   neg   CEREBRAL ANEURYSM REPAIR  02/1999   COLONOSCOPY     HAMMER TOE SURGERY  11/15/2020   3rd digit bilateral feet    hospitalization  2004   GI bleed, PUD, diverticulosis (EGD,colonscopy)   hospitalization     PE, NSVT, s/p defib   KNEE ARTHROSCOPY     bilateral   LEFT HEART CATHETERIZATION WITH CORONARY ANGIOGRAM N/A 02/22/2012   Procedure: LEFT HEART CATHETERIZATION WITH CORONARY ANGIOGRAM;  Surgeon: Burnell Blanks, MD;  Location: [facility] CATH LAB;  Service: Cardiovascular;  Laterality: N/A;   PARTIAL HYSTERECTOMY     Fibroids   TONGUE BIOPSY  12/12/2017   due to sore tongue and white patches/abnormal cells   TOTAL KNEE ARTHROPLASTY Left 02/13/2016   Procedure: TOTAL KNEE ARTHROPLASTY;  Surgeon: Vickey Huger, MD;  Location: [place];  Service: Orthopedics;  Laterality: Left;   UPPER GASTROINTESTINAL ENDOSCOPY       MEDICATIONS:   Medications Prior to Admission  Medication Sig Dispense Refill Last Dose   acetaminophen (TYLENOL) 500 MG tablet Take 500 mg by mouth every 6 (six) hours as needed for moderate pain.   Past Week   albuterol (VENTOLIN HFA) 108 (90 Base) MCG/ACT inhaler Inhale 2 puffs into the lungs every 6  (six) hours as needed for wheezing or shortness of breath. 18 g 3 05/22/2021 at 0630   Ascorbic Acid (VITAMIN C) 1000 MG tablet Take 1,000 mg by mouth daily.   Past Week   aspirin 81 MG tablet Take 81 mg by mouth daily.   Past Week   baclofen (LIORESAL) 10 MG tablet TAKE 1 TABLET BY MOUTH AT BEDTIME AS NEEDED FOR MUSCLE SPASM 30 tablet 2 Past Month   calcium carbonate (OS-CAL - DOSED IN MG OF ELEMENTAL CALCIUM) 1250 (500 Ca) MG tablet Take 1 tablet by mouth daily.   Past Month   carvedilol (COREG) 12.5 MG tablet Take 1 tablet (12.5 mg total) by mouth 2 (two) times daily. 180 tablet 3 05/22/2021 at 0630   cetirizine (ZYRTEC) 10 MG tablet Take 10 mg by mouth daily.   05/21/2021  Cholecalciferol (DIALYVITE VITAMIN D 5000) 125 MCG (5000 UT) capsule Take 5,000 Units by mouth daily.   05/21/2021   diclofenac Sodium (VOLTAREN) 1 % GEL Apply 2-4 grams to affected joint 4 times daily as needed. 400 g 2 Past Month   fexofenadine (ALLEGRA) 180 MG tablet Take 180 mg by mouth daily.      fluticasone (FLONASE) 50 MCG/ACT nasal spray Place 2 sprays into both nostrils daily as needed for allergies or rhinitis. (Patient taking differently: Place 2 sprays into both nostrils daily.) 48 g 3    furosemide (LASIX) 40 MG tablet Take 20-40 mg by mouth as needed for edema.   Past Week   hydroxypropyl methylcellulose / hypromellose (ISOPTO TEARS / GONIOVISC) 2.5 % ophthalmic solution Place 1 drop into both eyes at bedtime.   05/21/2021   latanoprost (XALATAN) 0.005 % ophthalmic solution Place 1 drop into both eyes at bedtime.   05/21/2021   levothyroxine (SYNTHROID) 50 MCG tablet Take 1 tablet (50 mcg total) by mouth daily. 90 tablet 1 05/22/2021 at 0600   magnesium oxide (MAG-OX) 400 MG tablet Take 1 tablet (400 mg total) by mouth in the morning, at noon, and at bedtime. (Patient taking differently: Take 400 mg by mouth 2 (two) times daily.) 270 tablet 3    mometasone (ELOCON) 0.1 % lotion Apply 1 application topically daily as  needed (rash). 60 mL 1 Past Week   montelukast (SINGULAIR) 10 MG tablet Take 1 tablet (10 mg total) by mouth at bedtime. 90 tablet 3 05/21/2021   Multiple Vitamins-Minerals (MULTIVITAMIN PO) Take 1 tablet by mouth daily.   05/21/2021   pantoprazole (PROTONIX) 40 MG tablet Take 1 tablet (40 mg total) by mouth daily. (Patient taking differently: Take 40 mg by mouth See admin instructions. Take 40 mg daily, may take a second 40 mg dose as needed for acid reflux) 30 tablet 11 05/22/2021 at 0630   phenol (CHLORASEPTIC) 1.4 % LIQD Use as directed 1 spray in the mouth or throat 3 (three) times daily as needed for throat irritation / pain. 20 mL 0 Past Month   polyethylene glycol powder (GLYCOLAX/MIRALAX) powder Take 17 grams by mouth daily 1080 g 0 Past Week   potassium chloride (KLOR-CON) 10 MEQ tablet TAKE 1/2 TABLET EVERY DAY (Patient taking differently: Take 5 mEq by mouth daily as needed (when taking lasix).) 15 tablet 0 Past Month   Respiratory Therapy Supplies (FLUTTER) DEVI 1 Device by Does not apply route as needed. 1 each 0 Past Month   sodium chloride HYPERTONIC 3 % nebulizer solution Take by nebulization 2 (two) times daily as needed for cough. 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 750 mL 12 Past Month   traMADol (ULTRAM) 50 MG tablet Take 1 tablet (50 mg total) by mouth 2 (two) times daily. (Patient taking differently: Take 50 mg by mouth 2 (two) times daily as needed for moderate pain.) 180 tablet 1 Past Month   traZODone (DESYREL) 50 MG tablet Take 0.5-1 tablets (25-50 mg total) by mouth at bedtime as needed for sleep. (Patient taking differently: Take 50 mg by mouth at bedtime as needed for sleep.) 90 tablet 0 05/21/2021   zolpidem (AMBIEN) 5 MG tablet Take 1 tablet (5 mg total) by mouth at bedtime as needed for sleep. (Patient taking differently: Take 5 mg by mouth at bedtime.) 90 tablet 0 05/21/2021   albuterol (PROVENTIL) (2.5 MG/3ML) 0.083% nebulizer solution USE 1 VIAL IN NEBULIZER EVERY 6 HOURS (Patient taking  differently: Take 2.5 mg by nebulization every  6 (six) hours as needed for shortness of breath.) 120 mL 11 More than a month   ezetimibe (ZETIA) 10 MG tablet Take 1 tablet (10 mg total) by mouth daily. 30 tablet 11 has not started yet   nitroGLYCERIN (NITROSTAT) 0.4 MG SL tablet DISSOLVE ONE TABLET UNDER THE TONGUE EVERY 5 MINUTES AS NEEDED FOR CHEST PAIN.  DO NOT EXCEED A TOTAL OF 3 DOSES IN 15 MINUTES 25 tablet 0 has never used    ALLERGIES:   Allergies  Allergen Reactions   Ace Inhibitors Swelling    Angioedema; makes tongue "break out"    Amitriptyline Hcl Other (See Comments)     makes her too sleepy!   Atorvastatin Other (See Comments)    SEVERE MYALGIA   Cymbalta [Duloxetine Hcl] Nausea Only and Other (See Comments)    Sleepiness/ sick   Dilantin [Phenytoin Sodium Extended] Rash    Severe rash   Paroxetine Nausea Only    Rapid heartbeat   Ramipril Other (See Comments)    TONGUE ULCERS    Rosuvastatin Other (See Comments)    SEVERE MYALGIA   Carbamazepine Rash   Codeine Itching   Iodine-131 Other (See Comments)    Swelling at IV site only, no swelling or SOB around mouth.!   Paxlovid [Nirmatrelvir-Ritonavir] Itching   Phenytoin Sodium Extended Rash   Simvastatin Other (See Comments)    Increase in CK, myalgias   Wellbutrin [Bupropion] Palpitations    REVIEW OF SYSTEMS:  A comprehensive review of systems was negative except for: Musculoskeletal: positive for arthralgias and bone pain   FAMILY HISTORY:   Family History  Problem Relation Age of Onset   Diabetes Mother    Alzheimer's disease Mother    Hypertension Mother    Obesity Mother    Other Brother        Thyroid problem 10/2016   Allergies Sister    Heart disease Sister    Heart disease Brother    Prostate cancer Brother 84       same brother as throat cancer   Throat cancer Brother        dx in his 84s; also a smoker   Heart disease Father    Cancer Maternal Grandmother 69       colon cancer or  abdominal cancer   Lung cancer Maternal Grandfather 78   Breast cancer Maternal Aunt 55   Colon cancer Maternal Aunt 61       same sister as breast at 60   Lung cancer Maternal Uncle    Breast cancer Maternal Aunt        dx in her 54s   Pancreatic cancer Cousin 67       maternal first cousin   Breast cancer Cousin        paternal first cousin twice removed died in her 85s   Heart disease Son        Cardiac Arrest 07/2016   Stroke Neg Hx     SOCIAL HISTORY:   Social History   Tobacco Use   Smoking status: Former    Packs/day: 1.00    Years: 15.00    Pack years: 15.00    Types: Cigarettes    Quit date: 08/20/1989    Years since quitting: 31.7   Smokeless tobacco: Never   Tobacco comments:    Started at 76; less than 1 PPD  Substance Use Topics   Alcohol use: Yes    Comment: occas     EXAMINATION:  Vital signs in last 24 hours: Temp:  [98 F (36.7 C)] 98 F (36.7 C) (10/03 0820) Pulse Rate:  [73] 73 (10/03 0820) Resp:  [20] 20 (10/03 0820) BP: (134)/(64) 134/64 (10/03 0820) SpO2:  [97 %] 97 % (10/03 0820) Weight:  [97.5 kg] 97.5 kg (10/03 0757)  BP 134/64   Pulse 73   Temp 98 F (36.7 C) (Oral)   Resp 20   Ht 5' 6.5" (1.689 m)   Wt 97.5 kg   LMP  (LMP Unknown)   SpO2 97%   BMI 34.17 kg/m   General Appearance:    Alert, cooperative, no distress, appears stated age  Head:    Normocephalic, without obvious abnormality, atraumatic  Eyes:    PERRL, conjunctiva/corneas clear, EOM's intact, fundi    benign, both eyes  Ears:    Normal TM's and external ear canals, both ears  Nose:   Nares normal, septum midline, mucosa normal, no drainage    or sinus tenderness  Throat:   Lips, mucosa, and tongue normal; teeth and gums normal  Neck:   Supple, symmetrical, trachea midline, no adenopathy;    thyroid:  no enlargement/tenderness/nodules; no carotid   bruit or JVD  Back:     Symmetric, no curvature, ROM normal, no CVA tenderness  Lungs:     Clear to auscultation  bilaterally, respirations unlabored  Chest Wall:    No tenderness or deformity   Heart:    Regular rate and rhythm, S1 and S2 normal, no murmur, rub   or gallop  Breast Exam:    No tenderness, masses, or nipple abnormality  Abdomen:     Soft, non-tender, bowel sounds active all four quadrants,    no masses, no organomegaly  Genitalia:    Normal female without lesion, discharge or tenderness  Rectal:    Normal tone, no masses or tenderness;   guaiac negative stool  Extremities:   Extremities normal, atraumatic, no cyanosis or edema  Pulses:   2+ and symmetric all extremities  Skin:   Skin color, texture, turgor normal, no rashes or lesions  Lymph nodes:   Cervical, supraclavicular, and axillary nodes normal  Neurologic:   CNII-XII intact, normal strength, sensation and reflexes    throughout    Musculoskeletal:  ROM 0-120, Ligaments intact,  Imaging Review Plain radiographs demonstrate severe degenerative joint disease of the right knee. The overall alignment is neutral. The bone quality appears to be good for age and reported activity level.  Assessment/Plan: Primary osteoarthritis, right knee   The patient history, physical examination and imaging studies are consistent with advanced degenerative joint disease of the right knee. The patient has failed conservative treatment.  The clearance notes were reviewed.  After discussion with the patient it was felt that Total Knee Replacement was indicated. The procedure,  risks, and benefits of total knee arthroplasty were presented and reviewed. The risks including but not limited to aseptic loosening, infection, blood clots, vascular injury, stiffness, patella tracking problems complications among others were discussed. The patient acknowledged the explanation, agreed to proceed with the plan.  Preoperative templating of the joint replacement has been completed, documented, and submitted to the Operating Room personnel in order to optimize  intra-operative equipment management.    Patient's anticipated LOS is less than 2 midnights, meeting these requirements: - Lives within 1 hour of care - Has a competent adult at home to recover with post-op recover - NO history of  - Chronic pain requiring opiods  - Diabetes  -  Coronary Artery Disease  - Heart failure  - Heart attack  - Stroke  - DVT/VTE  - Cardiac arrhythmia  - Respiratory Failure/COPD  - Renal failure  - Anemia  - Advanced Liver disease     Donia Ast 05/22/2021, 8:58 AM

## 2021-05-23 ENCOUNTER — Encounter (HOSPITAL_COMMUNITY): Payer: Self-pay | Admitting: Orthopedic Surgery

## 2021-05-23 ENCOUNTER — Other Ambulatory Visit: Payer: Self-pay

## 2021-05-23 DIAGNOSIS — M1711 Unilateral primary osteoarthritis, right knee: Secondary | ICD-10-CM | POA: Diagnosis not present

## 2021-05-23 LAB — BASIC METABOLIC PANEL
Anion gap: 7 (ref 5–15)
BUN: 7 mg/dL — ABNORMAL LOW (ref 8–23)
CO2: 26 mmol/L (ref 22–32)
Calcium: 9.4 mg/dL (ref 8.9–10.3)
Chloride: 107 mmol/L (ref 98–111)
Creatinine, Ser: 0.7 mg/dL (ref 0.44–1.00)
GFR, Estimated: >60 mL/min (ref >60)
Glucose, Bld: 162 mg/dL — ABNORMAL HIGH (ref 70–99)
Potassium: 3.9 mmol/L (ref 3.5–5.1)
Sodium: 140 mmol/L (ref 135–145)

## 2021-05-23 LAB — CBC
HCT: 35.2 % — ABNORMAL LOW (ref 36.0–46.0)
Hemoglobin: 11.4 g/dL — ABNORMAL LOW (ref 12.0–15.0)
MCH: 29.9 pg (ref 26.0–34.0)
MCHC: 32.4 g/dL (ref 30.0–36.0)
MCV: 92.4 fL (ref 80.0–100.0)
Platelets: 221 10*3/uL (ref 150–400)
RBC: 3.81 MIL/uL — ABNORMAL LOW (ref 3.87–5.11)
RDW: 12.2 % (ref 11.5–15.5)
WBC: 9.7 10*3/uL (ref 4.0–10.5)
nRBC: 0 % (ref 0.0–0.2)

## 2021-05-23 MED ORDER — ASPIRIN 325 MG PO TBEC: 325.0000 mg | DELAYED_RELEASE_TABLET | Freq: Two times a day (BID) | ORAL | 0 refills | Status: DC

## 2021-05-23 MED ORDER — METHOCARBAMOL 500 MG PO TABS: 500.0000 mg | ORAL_TABLET | Freq: Four times a day (QID) | ORAL | 0 refills | Status: DC | PRN

## 2021-05-23 MED ORDER — OXYCODONE HCL 5 MG PO TABS: 5.0000 mg | ORAL_TABLET | ORAL | 0 refills | Status: DC | PRN

## 2021-05-23 NOTE — Progress Notes (Signed)
SPORTS MEDICINE AND JOINT REPLACEMENT  Lara Mulch, MD    Carlyon Shadow, PA-C [place], [place], [place]  [address]                             [phone]   PROGRESS NOTE  Subjective:  negative for Chest Pain  negative for Shortness of Breath  negative for Nausea/Vomiting   negative for Calf Pain  negative for Bowel Movement   Tolerating Diet: yes         Patient reports pain as 3 on 0-10 scale.    Objective: Vital signs in last 24 hours:   Patient Vitals for the past 24 hrs:  BP Temp Temp src Pulse Resp SpO2  05/23/21 0941 (!) 107/58 97.7 F (36.5 C) Oral 66 18 94 %  05/23/21 0217 (!) 153/68 (!) 97.5 F (36.4 C) Oral 72 16 99 %  05/23/21 0039 (!) 138/59 98.1 F (36.7 C) Oral 81 16 100 %  05/22/21 2244 (!) 176/79 97.6 F (36.4 C) Oral 86 16 100 %  05/22/21 2200 (!) 162/78 -- -- 86 -- 97 %  05/22/21 2145 -- -- -- 87 -- 99 %  05/22/21 2130 -- -- -- 85 -- 99 %  05/22/21 2115 -- -- -- 93 -- 96 %  05/22/21 2100 (!) 161/73 -- -- 88 -- 100 %  05/22/21 2045 -- -- -- 91 -- 95 %  05/22/21 2030 -- -- -- 88 -- 98 %  05/22/21 2015 -- -- -- 80 -- 99 %  05/22/21 2000 (!) 163/72 -- -- 78 -- 97 %  05/22/21 1945 -- -- -- 80 -- 95 %  05/22/21 1930 -- -- -- 80 -- 99 %  05/22/21 1915 -- -- -- 79 -- 100 %  05/22/21 1900 (!) 155/77 -- -- 76 17 100 %  05/22/21 1815 140/64 -- -- 65 20 100 %  05/22/21 1800 (!) 144/63 -- -- 65 16 100 %  05/22/21 1745 132/61 -- -- 65 17 99 %  05/22/21 1730 (!) 145/64 -- -- 66 (!) 22 99 %  05/22/21 1715 (!) 143/65 -- -- 65 (!) 22 99 %  05/22/21 1700 (!) 141/73 -- -- 66 18 100 %  05/22/21 1645 119/89 -- -- 68 17 100 %  05/22/21 1630 136/69 -- -- 67 (!) 23 100 %  05/22/21 1625 137/67 (!) 97.5 F (36.4 C) -- 66 (!) 23 100 %    @flow {1959:LAST@   Intake/Output from previous day:   10/03 0701 - 10/04 0700 In: 1672.9 [I.V.:1472.9] Out: 1725 [Urine:1675]   Intake/Output this shift:   10/04 0701 - 10/04 1900 In: 120 [P.O.:120] Out: -     Intake/Output      10/03 0701 10/04 0700 10/04 0701 10/05 0700   P.O.  120   I.V. (mL/kg) 1472.9 (15.1)    IV Piggyback 200    Total Intake(mL/kg) 1672.9 (17.2) 120 (1.2)   Urine (mL/kg/hr) 1675    Blood 50    Total Output 1725    Net -52.1 +120        Urine Occurrence 1 x 1 x   Stool Occurrence  0 x      LABORATORY DATA: Recent Labs    05/23/21 0336  WBC 9.7  HGB 11.4*  HCT 35.2*  PLT 221   Recent Labs    05/23/21 0336  NA 140  K 3.9  CL 107  CO2 26  BUN 7*  CREATININE 0.70  GLUCOSE 162*  CALCIUM 9.4   Lab Results  Component Value Date   INR 0.99 02/03/2016   INR 0.9 02/19/2012   INR 0.9 ratio 12/20/2009    Examination:  General appearance: alert, cooperative, and no distress Extremities: extremities normal, atraumatic, no cyanosis or edema  Wound Exam: clean, dry, intact   Drainage:  None: wound tissue dry  Motor Exam: Quadriceps and Hamstrings Intact  Sensory Exam: Superficial Peroneal, Deep Peroneal, and Tibial normal   Assessment:    1 Day Post-Op  Procedure(s) (LRB): TOTAL KNEE ARTHROPLASTY (Right)  ADDITIONAL DIAGNOSIS:  Active Problems:   S/P total knee replacement     Plan: Physical Therapy as ordered Weight Bearing as Tolerated (WBAT)  DVT Prophylaxis:  Aspirin  DISCHARGE PLAN: Home  Patient doing well and ready for D/C home       Patient's anticipated LOS is less than 2 midnights, meeting these requirements:  - Lives within 1 hour of care - Has a competent adult at home to recover with post-op recover - NO history of  - Chronic pain requiring opiods  - Diabetes  - Coronary Artery Disease  - Heart failure  - Heart attack  - Stroke  - DVT/VTE  - Cardiac arrhythmia  - Respiratory Failure/COPD  - Renal failure  - Anemia  - Advanced Liver disease      Donia Ast 05/23/2021, 12:36 PM

## 2021-05-23 NOTE — TOC Transition Note (Signed)
Transition of Care [facility]) - CM/SW Discharge Note   Patient Details  Name: [name] MRN: [id number] Date of Birth: 1950/09/06  [facility] CM/SW Contact:  Lennart Pall, LCSW Phone Number: 05/23/2021, 10:56 AM   Clinical Narrative:    Met with pt and confirming she has already received her rolling walker and CPM via [employer] prior to surgery.  HHPT prearranged with [facility] by ortho MD office.  No further TOC needs.   Final next level of care: [place] Barriers to Discharge: No Barriers Identified   Patient Goals and CMS Choice Patient states their goals for this hospitalization and ongoing recovery are:: return home      Discharge Placement                       Discharge Plan and Services                DME Arranged: Walker rolling, CPM DME Agency: [employer]       HH Arranged: PT [place] Agency: [facility]        Social Determinants of Health (SDOH) Interventions     Readmission Risk Interventions No flowsheet data found.

## 2021-05-23 NOTE — Progress Notes (Signed)
Nutrition Brief Note  Patient identified on the Malnutrition Screening Tool (MST) Report  Pt reports that she was scheduled for surgery in September but it was cancelled when she tested positive for COVID. She was stressed and felt that she did not eat as well. Sunday she did not eat much but ate 100% of her Breakfast this am.   Pt has had her TKR and plans to d/c today.   Wt Readings from Last 15 Encounters:  05/22/21 97.5 kg  05/17/21 97.5 kg  05/15/21 98.4 kg  04/27/21 94.4 kg  04/26/21 97.6 kg  04/10/21 99.3 kg  04/04/21 99.2 kg  03/29/21 100.2 kg  03/23/21 99.8 kg  03/07/21 100 kg  02/15/21 97.5 kg  02/10/21 100.9 kg  02/01/21 99.8 kg  01/10/21 102.6 kg  01/02/21 101.6 kg    Body mass index is 34.17 kg/m. Patient meets criteria for obesity based on current BMI.   Current diet order is Regular, patient is consuming approximately 100% of meals at this time. Labs and medications reviewed.   No nutrition interventions warranted at this time. If nutrition issues arise, please consult RD.   Lockie Pares., RD, LDN, CNSC See AMiON for contact information

## 2021-05-23 NOTE — Progress Notes (Signed)
   05/23/21 1400  PT Visit Information  Last PT Received On 05/23/21  Pt is doing well. Ready for d/c from PT standpoint with family assist as needed.  Assistance Needed +1  History of Present Illness s/p R TKA. PMH: anxiety, pacemaker, VA, fibromyalgia, L TKA  Subjective Data  Patient Stated Goal home  Precautions  Precautions Fall;Knee  Restrictions  Weight Bearing Restrictions No  Pain Assessment  Pain Assessment 0-10  Pain Score 4  Pain Location rigth knee  Pain Descriptors / Indicators Grimacing;Discomfort  Pain Intervention(s) Limited activity within patient's tolerance;Monitored during session;Premedicated before session;Repositioned;Ice applied  Cognition  Arousal/Alertness Awake/alert  Behavior During Therapy WFL for tasks assessed/performed  Overall Cognitive Status Within Functional Limits for tasks assessed  Bed Mobility  General bed mobility comments in recliner  Transfers  Overall transfer level Needs assistance  Equipment used Rolling walker (2 wheeled)  Transfers Sit to/from Stand  Sit to Stand Min guard;Supervision  General transfer comment cues for hand placement; min/guard from lower surface (toilet), supervision from chair  Ambulation/Gait  Ambulation/Gait assistance Supervision  Gait Distance (Feet) 50 Feet  Assistive device Rolling walker (2 wheeled)  Gait Pattern/deviations Step-to pattern  General Gait Details cues for sequence and RW position  Total Joint Exercises  Ankle Circles/Pumps AROM;Both;10 reps  Quad Sets AROM;Both;10 reps  Heel Slides AAROM;Right;10 reps  Hip ABduction/ADduction AROM;Right;10 reps  Straight Leg Raises AROM;AAROM;Right;10 reps  PT - End of Session  Equipment Utilized During Treatment Gait belt  Activity Tolerance Patient tolerated treatment well  Patient left in chair;with call bell/phone within reach;with chair alarm set  Nurse Communication Mobility status   PT - Assessment/Plan  PT Plan Current plan remains  appropriate  PT Visit Diagnosis Other abnormalities of gait and mobility (R26.89);Difficulty in walking, not elsewhere classified (R26.2)  PT Frequency (ACUTE ONLY) 7X/week  Follow Up Recommendations Follow surgeon's recommendation for DC plan and follow-up therapies  PT equipment None recommended by PT  AM-PAC PT "6 Clicks" Mobility Outcome Measure (Version 2)  Help needed turning from your back to your side while in a flat bed without using bedrails? 3  Help needed moving from lying on your back to sitting on the side of a flat bed without using bedrails? 3  Help needed moving to and from a bed to a chair (including a wheelchair)? 3  Help needed standing up from a chair using your arms (e.g., wheelchair or bedside chair)? 3  Help needed to walk in hospital room? 3  Help needed climbing 3-5 steps with a railing?  3  6 Click Score 18  Consider Recommendation of Discharge To: Home with [facility]  PT Goal Progression  Progress towards PT goals Progressing toward goals  Acute Rehab PT Goals  PT Goal Formulation With patient  Time For Goal Achievement 06/06/21  Potential to Achieve Goals Good  PT Time Calculation  PT Start Time (ACUTE ONLY) 1424  PT Stop Time (ACUTE ONLY) 1448  PT Time Calculation (min) (ACUTE ONLY) 24 min  PT General Charges  $$ ACUTE PT VISIT 1 Visit  PT Treatments  $Gait Training 8-22 mins  $Therapeutic Exercise 8-22 mins

## 2021-05-23 NOTE — Evaluation (Signed)
Physical Therapy Evaluation Patient Details Name: [name] MRN: [id number] DOB: 1951/05/26 Today's Date: 05/23/2021  History of Present Illness  s/p R TKA. PMH: anxiety, pacemaker, VA, fibromyalgia, L TKA  Clinical Impression  Pt is s/p TKA resulting in the deficits listed below (see PT Problem List).  Progressing well with mobility. Anticipate pt will be able to d.c this pm after second session of PT  Pt will benefit from skilled PT to increase their independence and safety with mobility to allow discharge to the venue listed below.         Recommendations for follow up therapy are one component of a multi-disciplinary discharge planning process, led by the attending physician.  Recommendations may be updated based on patient status, additional functional criteria and insurance authorization.  Follow Up Recommendations Follow surgeon's recommendation for DC plan and follow-up therapies    Equipment Recommendations  None recommended by PT    Recommendations for Other Services       Precautions / Restrictions Precautions Precautions: Fall;Knee Restrictions Weight Bearing Restrictions: No      Mobility  Bed Mobility Overal bed mobility: Needs Assistance Bed Mobility: Supine to Sit     Supine to sit: Supervision     General bed mobility comments: for safety    Transfers Overall transfer level: Needs assistance Equipment used: Rolling walker (2 wheeled) Transfers: Sit to/from Stand Sit to Stand: Min guard         General transfer comment: cues for hand placement  Ambulation/Gait Ambulation/Gait assistance: Min guard;Supervision Gait Distance (Feet): 80 Feet Assistive device: Rolling walker (2 wheeled) Gait Pattern/deviations: Step-to pattern     General Gait Details: cues for sequence and RW position  Stairs            Wheelchair Mobility    Modified Rankin (Stroke Patients Only)       Balance                                              Pertinent Vitals/Pain      Home Living Family/patient expects to be discharged to:: Private residence Living Arrangements: Alone Available Help at Discharge: Family Type of Home: House (2 steps to get to living area) Home Access: Stairs to enter;Ramped entrance     Home Layout: One level Home Equipment: Environmental consultant - 2 wheels;Walker - 4 wheels;Bedside commode      Prior Function Level of Independence: Independent         Comments: cane longer distances     Hand Dominance        Extremity/Trunk Assessment   Upper Extremity Assessment Upper Extremity Assessment: Overall WFL for tasks assessed    Lower Extremity Assessment Lower Extremity Assessment: RLE deficits/detail RLE Deficits / Details: ankle  WFL, knee and hip 2+/5       Communication   Communication: No difficulties  Cognition                                              General Comments      Exercises Total Joint Exercises Ankle Circles/Pumps: AROM;Both;10 reps Quad Sets: AROM;Both;10 reps   Assessment/Plan    PT Assessment Patient needs continued PT services  PT Problem List Decreased strength;Decreased range of motion;Decreased activity  tolerance;Decreased mobility;Decreased balance;Pain       PT Treatment Interventions DME instruction;Therapeutic activities;Gait training;Functional mobility training;Therapeutic exercise;Patient/family education    PT Goals (Current goals can be found in the Care Plan section)  Acute Rehab PT Goals Patient Stated Goal: home PT Goal Formulation: With patient Time For Goal Achievement: 06/06/21 Potential to Achieve Goals: Good    Frequency 7X/week   Barriers to discharge        Co-evaluation               AM-PAC PT "6 Clicks" Mobility  Outcome Measure Help needed turning from your back to your side while in a flat bed without using bedrails?: A Little Help needed moving from lying on your back  to sitting on the side of a flat bed without using bedrails?: A Little Help needed moving to and from a bed to a chair (including a wheelchair)?: A Little Help needed standing up from a chair using your arms (e.g., wheelchair or bedside chair)?: A Little Help needed to walk in hospital room?: A Little Help needed climbing 3-5 steps with a railing? : A Little 6 Click Score: 18    End of Session Equipment Utilized During Treatment: Gait belt Activity Tolerance: Patient tolerated treatment well Patient left: in chair;with call bell/phone within reach;with chair alarm set Nurse Communication: Mobility status PT Visit Diagnosis: Other abnormalities of gait and mobility (R26.89);Difficulty in walking, not elsewhere classified (R26.2)    Time: 1210-1230 PT Time Calculation (min) (ACUTE ONLY): 20 min   Charges:   PT Evaluation $PT Eval Low Complexity: [place], PT  Acute Rehab Dept ([place]) [phone] Pager [phone]  05/23/2021   [facility] 05/23/2021, 1:53 PM

## 2021-05-23 NOTE — Discharge Summary (Signed)
SPORTS MEDICINE & JOINT REPLACEMENT   Lara Mulch, MD   Carlyon Shadow, PA-C [place], [place], [place]  [address]                             [phone]  PATIENT ID: [name]        MRN:  [id number]          DOB/AGE: 01-27-1951 / 70 y.o.    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   Bronchiectasis ([place])    Cerebral aneurysm, nonruptured    had a clip put in   CHF (congestive heart failure) (HCC)    Clostridium difficile infection    Depressive disorder, not elsewhere classified    Diverticulosis of colon (without mention of hemorrhage)    Esophageal reflux    Fatty liver    Fibromyalgia    Gastritis    GERD (gastroesophageal reflux disease)    GI bleed 2004   Glaucoma    Hiatal hernia    History of COVID-19 05/04/2021   Hyperlipidemia    Hypertension    Hypothyroidism    Internal hemorrhoids    Joint pain    Obstructive sleep apnea (adult) (pediatric)    Osteoarthritis    Ostium secundum type atrial septal defect    Other chronic nonalcoholic liver disease    Other pulmonary embolism and infarction    Palpitations    Paroxysmal ventricular tachycardia    Personal history of radiation therapy    PONV (postoperative nausea and vomiting)    Presence of permanent cardiac pacemaker    PUD (peptic ulcer disease)    Radiation 02/03/15-03/10/15   Right Breast   Sarcoid    per pt , not sure   Schatzki's ring    Shortness of breath    Sleep apnea    Stroke [facility]) 2013   tia/ pt feels it was around 2008 0r 2009    Takotsubo syndrome    Tubular adenoma of colon    Unspecified transient cerebral ischemia    Unspecified vitamin D deficiency     PROCEDURE: Procedure(s): TOTAL KNEE ARTHROPLASTY on 05/22/2021  CONSULTS:    HISTORY:  See H&P in chart  HOSPITAL COURSE:  [name] is a 70 y.o. admitted on 05/22/2021 and found to have a diagnosis of Osteoarthritis of right knee M17.11.  After appropriate laboratory studies were obtained  they were taken to the operating room on 05/22/2021 and underwent Procedure(s): TOTAL KNEE ARTHROPLASTY.   They were given perioperative antibiotics:  Anti-infectives (From admission, onward)    Start     Dose/Rate Route Frequency Ordered Stop   05/22/21 0745  ceFAZolin (ANCEF) IVPB 2g/100 mL premix        2 g 200 mL/hr over 30 Minutes Intravenous On call to O.R. 05/22/21 2725 05/22/21 1525     .  Patient given tranexamic acid IV or topical and exparel intra-operatively.  Tolerated the procedure well.  POD# 1: Vital signs were stable.  Patient denied Chest pain, shortness of breath, or calf pain.  Patient was started on Aspirin twice daily at 8am.  Consults to PT, OT, and care management were made.  The patient was weight bearing as tolerated.  CPM was placed on the operative leg 0-90 degrees for 6-8 hours a day. When out of the CPM, patient was placed in the foam block to achieve full extension. Incentive spirometry was taught.  Dressing was changed.       POD #2, Continued  PT for ambulation and exercise program.  IV saline locked.  O2 discontinued.    The remainder of the hospital course was dedicated to ambulation and strengthening.   The patient was discharged on 1 Day Post-Op in  Good condition.  Blood products given:none  DIAGNOSTIC STUDIES: Recent vital signs: Patient Vitals for the past 24 hrs:  BP Temp Temp src Pulse Resp SpO2  05/23/21 0941 (!) 107/58 97.7 F (36.5 C) Oral 66 18 94 %  05/23/21 0217 (!) 153/68 (!) 97.5 F (36.4  C) Oral 72 16 99 %  05/23/21 0039 (!) 138/59 98.1 F (36.7 C) Oral 81 16 100 %  05/22/21 2244 (!) 176/79 97.6 F (36.4 C) Oral 86 16 100 %  05/22/21 2200 (!) 162/78 -- -- 86 -- 97 %  05/22/21 2145 -- -- -- 87 -- 99 %  05/22/21 2130 -- -- -- 85 -- 99 %  05/22/21 2115 -- -- -- 93 -- 96 %  05/22/21 2100 (!) 161/73 -- -- 88 -- 100 %  05/22/21 2045 -- -- -- 91 -- 95 %  05/22/21 2030 -- -- -- 88 -- 98 %  05/22/21 2015 -- -- -- 80 -- 99 %  05/22/21 2000 (!) 163/72 -- -- 78 -- 97 %  05/22/21 1945 -- -- -- 80 -- 95 %  05/22/21 1930 -- -- -- 80 -- 99 %  05/22/21 1915 -- -- -- 79 -- 100 %  05/22/21 1900 (!) 155/77 -- -- 76 17 100 %  05/22/21 1815 140/64 -- -- 65 20 100 %  05/22/21 1800 (!) 144/63 -- -- 65 16 100 %  05/22/21 1745 132/61 -- -- 65 17 99 %  05/22/21 1730 (!) 145/64 -- -- 66 (!) 22 99 %  05/22/21 1715 (!) 143/65 -- -- 65 (!) 22 99 %  05/22/21 1700 (!) 141/73 -- -- 66 18 100 %  05/22/21 1645 119/89 -- -- 68 17 100 %  05/22/21 1630 136/69 -- -- 67 (!) 23 100 %  05/22/21 1625 137/67 (!) 97.5 F (36.4 C) -- 66 (!) 23 100 %       Recent laboratory studies: Recent Labs    05/23/21 0336  WBC 9.7  HGB 11.4*  HCT 35.2*  PLT 221   Recent Labs    05/23/21 0336  NA 140  K 3.9  CL 107  CO2 26  BUN 7*  CREATININE 0.70  GLUCOSE 162*  CALCIUM 9.4   Lab Results  Component Value Date   INR 0.99 02/03/2016   INR 0.9 02/19/2012   INR 0.9 ratio 12/20/2009     Recent Radiographic Studies :  DG Chest 2 View  Result Date: 05/18/2021 CLINICAL DATA:  70 year old female under preoperative evaluation. EXAM: CHEST - 2 VIEW COMPARISON:  Chest x-ray 10/23/2019. FINDINGS: Lung volumes are low. Diffuse peribronchial cuffing and widespread areas of interstitial prominence are again noted throughout the mid to lower lungs bilaterally. No  consolidative airspace disease. No pleural effusions. No pneumothorax. No pulmonary nodule or mass noted. Pulmonary vasculature and the cardiomediastinal  silhouette are within normal limits. Atherosclerosis in the thoracic aorta. Left-sided pacemaker/AICD with lead tip projecting over the expected location of the right ventricle. IMPRESSION: 1. No radiographic evidence of acute cardiopulmonary disease. 2. Low lung volumes with chronic findings in the lungs suggestive of potential interstitial lung disease. Further evaluation with nonemergent high-resolution chest CT is recommended in the near future to better evaluate these findings. 3. Aortic atherosclerosis. Electronically Signed   By: Vinnie Langton M.D.   On: 05/18/2021 13:47    DISCHARGE INSTRUCTIONS:   DISCHARGE MEDICATIONS:      FOLLOW UP VISIT:    Follow-up Information     [facility] Follow up.   Specialty: Home Health Services Why: to provide home health physical therapy Contact information: [place] [phone]                 DISPOSITION: HOME VS. SNF  Dental Antibiotics:  In most cases prophylactic antibiotics for Dental procdeures after total joint surgery are not necessary.  Exceptions are as follows:  1. History of prior total joint infection  2. Severely immunocompromised (Organ Transplant, cancer chemotherapy, Rheumatoid biologic meds such as [place])  3. Poorly controlled diabetes (A1C &gt; 8.0, blood glucose over 200)  If you have one of these conditions, contact your surgeon for an antibiotic prescription, prior to your dental procedure.   CONDITION:  Good   Donia Ast 05/23/2021, 12:37 PM

## 2021-05-23 NOTE — Anesthesia Postprocedure Evaluation (Signed)
Anesthesia Post Note  Patient: [name]  Procedure(s) Performed: TOTAL KNEE ARTHROPLASTY (Right: Knee)     Patient location during evaluation: PACU Anesthesia Type: Regional and Spinal Level of consciousness: oriented and awake and alert Pain management: pain level controlled Vital Signs Assessment: post-procedure vital signs reviewed and stable Respiratory status: spontaneous breathing and respiratory function stable Cardiovascular status: blood pressure returned to baseline and stable Postop Assessment: no headache, no backache, no apparent nausea or vomiting and patient able to bend at knees Anesthetic complications: no   No notable events documented.  Last Vitals:  Vitals:   05/23/21 0217 05/23/21 0941  BP: (!) 153/68 (!) 107/58  Pulse: 72 66  Resp: 16 18  Temp: (!) 36.4 C 36.5 C  SpO2: 99% 94%    Last Pain:  Vitals:   05/23/21 0941  TempSrc: Oral  PainSc:                  [name]

## 2021-05-24 ENCOUNTER — Telehealth: Payer: Self-pay

## 2021-05-24 ENCOUNTER — Other Ambulatory Visit: Payer: Self-pay

## 2021-05-24 NOTE — Telephone Encounter (Signed)
Transition Care Management Follow-up Telephone Call Date of discharge and from where: 05/23/2021, [facility]  How have you been since you were released from the hospital? She said she is feeling pretty good, better than she thought.  Any questions or concerns? Yes - she would like to know when it was be safe for her to receive the omicron BA.5 booster. She tested positive for COVID 05/04/2021.  She said that Ms Minette Brine, NP can reply in [place]. She explained that she is scheduled for a shingles vaccine but she called her pharmacy and was told it would be $200.  Informed her that this CM will check with [facility] pharmacy to confirm out of pocket expense and inquire if she may be eligible for patient assistance programs.  This CM spoke to Cheryle Horsfall, [place] who said she confirmed with patient's insurance that the vaccine is not covered and would cost $203.88. Claiborne Billings also contacted the vaccine manufacturer and was informed that the patient may qualify for assistance if she can provide proof of paying $600 out of pocket for medications since 08/20/2020 and if she meets the financial income requirement.  This CM called patient and explained this information and provided her with the phone number for [facility] if she would like more information from [place].    Items Reviewed: Did the pt receive and understand the discharge instructions provided? Yes  Medications obtained and verified? Yes  - she said she has all medications and did not have any questions about her med regime.  Other? No  Any new allergies since your discharge? No  Do you have support at home? Yes  - her son and her niece are available to [place] and Equipment/Supplies: Were home health services ordered? yes If so, what is the name of the agency? [place]   Has the agency set up a time to come to the patient's home? Yes - she has been contacted and the PT is supposed to be seeing her this afternoon.  Were  any new equipment or medical supplies ordered?  Yes: CPM, RW  - these were ordered by her Orthopedic surgeon prior to her surgery.  What is the name of the medical supply agency? [employer]   Were you able to get the supplies/equipment? yes Do you have any questions related to the use of the equipment or supplies? No  She also has a nebulizer.  She is wearing compression stockings and the dressing remains intact on her right knee.   Functional Questionnaire: (I = Independent and D = Dependent) ADLs: independent, has RW to use with ambulation, WBAT    Follow up appointments reviewed:  PCP Hospital f/u appt confirmed?  She said she will call to schedule appointment.   She has an appointment for shingles vaccine 06/29/2021 but is concerned about the cost. She called her pharmacy and was informed that the vaccine would cost $200 and she is not able to afford that.    [facility] f/u appt confirmed? Yes  Scheduled to see orthopedic surgery on 05/29/2021.  Are transportation arrangements needed? No  If their condition worsens, is the pt aware to call PCP or go to the Emergency Dept.? Yes Was the patient provided with contact information for the PCP's office or ED? Yes Was to pt encouraged to call back with questions or concerns? Yes

## 2021-05-31 ENCOUNTER — Ambulatory Visit (INDEPENDENT_AMBULATORY_CARE_PROVIDER_SITE_OTHER): Payer: Medicare Other

## 2021-05-31 DIAGNOSIS — I428 Other cardiomyopathies: Secondary | ICD-10-CM | POA: Diagnosis not present

## 2021-06-01 LAB — CUP PACEART REMOTE DEVICE CHECK
Battery Remaining Longevity: 24 mo
Battery Remaining Percentage: 28 %
Brady Statistic RV Percent Paced: 0 %
Date Time Interrogation Session: 20221012030000
HighPow Impedance: 69 Ohm
Implantable Lead Implant Date: 20050603
Implantable Lead Location: 753860
Implantable Lead Model: 185
Implantable Lead Serial Number: 116340
Implantable Pulse Generator Implant Date: 20110505
Lead Channel Impedance Value: 603 Ohm
Lead Channel Pacing Threshold Amplitude: 0.6 V
Lead Channel Pacing Threshold Pulse Width: 0.4 ms
Lead Channel Setting Pacing Amplitude: 2.4 V
Lead Channel Setting Pacing Pulse Width: 0.4 ms
Lead Channel Setting Sensing Sensitivity: 0.4 mV
Pulse Gen Serial Number: 266301

## 2021-06-02 ENCOUNTER — Encounter: Payer: Self-pay | Admitting: Primary Care

## 2021-06-02 ENCOUNTER — Telehealth: Payer: Self-pay | Admitting: Pulmonary Disease

## 2021-06-02 ENCOUNTER — Ambulatory Visit: Payer: Medicare Other

## 2021-06-02 ENCOUNTER — Telehealth (INDEPENDENT_AMBULATORY_CARE_PROVIDER_SITE_OTHER): Payer: Medicare Other | Admitting: Primary Care

## 2021-06-02 DIAGNOSIS — R0602 Shortness of breath: Secondary | ICD-10-CM | POA: Diagnosis not present

## 2021-06-02 MED ORDER — PREDNISONE 10 MG PO TABS: ORAL_TABLET | ORAL | 0 refills | Status: DC

## 2021-06-02 MED ORDER — AZITHROMYCIN 250 MG PO TABS: ORAL_TABLET | ORAL | 0 refills | Status: DC

## 2021-06-02 NOTE — Telephone Encounter (Signed)
Would offer her a sick visit today with TP.

## 2021-06-02 NOTE — Telephone Encounter (Signed)
Called and spoke with patient. She stated that she had knee surgery last week and she has not been moving around a lot due to the swelling and pain. She noticed on last Friday that she began to have more chest congestion. She has not been able to cough up any phlegm despite taking Mucinex. She denied any fever, body aches, wheezing or runny nose.   She has been using the incentive spirometer that was given to her after surgery and has noticed that her level has decreased over the past few days.   She has been using her albuterol more frequently. Per patient, she has been using it at least 4 times a day.   Pharmacy is [facility] [address].   Dr. Shearon Stalls, can you please advise since Dr. Halford Chessman is not available? Thanks!

## 2021-06-02 NOTE — Telephone Encounter (Signed)
LM x 1 

## 2021-06-02 NOTE — Telephone Encounter (Signed)
From MyChart encounter:"I had knee surgery last week and I am not get around very well. I seems that I have a lot on congestion and shortness of breath when I do move around. Please tell Me what I need to do."   Symptoms began this morning (surgery on 10/13). Pharmacy of choice is [facility] on [address].

## 2021-06-02 NOTE — Telephone Encounter (Signed)
Called and spoke with patient. I offered her a video visit with Beth this afternoon at 330pm. She accepted and stated that she is familiar with the video visit.   Nothing further needed at time of call.

## 2021-06-02 NOTE — Progress Notes (Deleted)
Office Visit Note  Patient: [name]             Date of Birth: Sep 09, 1950           MRN: [id number]             PCP: Camillia Herter, NP Referring: Inda Coke, PA Visit Date: 06/15/2021 Occupation: @GUAROCC @  Subjective:  No chief complaint on file.   History of Present Illness: [name] is a 70 y.o. female ***   Activities of Daily Living:  Patient reports morning stiffness for *** {minute/hour:19697}.   Patient {ACTIONS;DENIES/REPORTS:21021675::"Denies"} nocturnal pain.  Difficulty dressing/grooming: {ACTIONS;DENIES/REPORTS:21021675::"Denies"} Difficulty climbing stairs: {ACTIONS;DENIES/REPORTS:21021675::"Denies"} Difficulty getting out of chair: {ACTIONS;DENIES/REPORTS:21021675::"Denies"} Difficulty using hands for taps, buttons, cutlery, and/or writing: {ACTIONS;DENIES/REPORTS:21021675::"Denies"}  No Rheumatology ROS completed.   PMFS History:  Patient Active Problem List   Diagnosis Date Noted   Pre-operative respiratory examination 05/18/2021   Statin myopathy 04/19/2021   Pain in joint of right shoulder 02/10/2021   Arthritis of both acromioclavicular joints 01/02/2021   Trochanteric bursitis of right hip 10/03/2020   Tailbone injury 05/05/2020   UTI (urinary tract infection) 05/05/2020   Insomnia 05/05/2020   Leg cramps 02/16/2020   Dysuria 02/16/2020   Right hip impingement syndrome 02/01/2020   Right bicipital tenosynovitis 02/01/2020   Bronchiectasis without complication ([place]) 08/65/7846   Myofascial pain 12/21/2019   Estrogen deficiency 12/10/2019   Obesity (BMI 30-39.9) 12/10/2019   Bronchiectasis with (acute) exacerbation ([place]) 10/23/2019   Elevated LFTs 10/15/2019   Lower abdominal pain 09/01/2019   Neck pain 10/25/2017   Paresthesias in left hand 10/25/2017   Paresthesia of foot, bilateral 04/12/2017   Primary osteoarthritis of both hands 09/27/2016   Primary osteoarthritis of both knees 09/27/2016   TMJ  pain dysfunction syndrome 06/12/2016   Nasopharyngitis, 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/2016   S/P total knee replacement 02/13/2016   Chronic pain of both knees 12/06/2015   Globus pharyngeus 05/17/2015   Genetic testing 12/23/2014   Family history of breast cancer    Family history of colon cancer    Family history of pancreatic cancer    History of breast cancer 11/23/2014   Allergy to ACE inhibitors 09/27/2014   Prediabetes 06/23/2013   RLS (restless legs syndrome) 01/15/2013   Hx of Clostridium difficile infection 10/10/2012   Dyspnea on exertion 04/02/2012   NICM (nonischemic cardiomyopathy) ([place]) 03/07/2012   Post-menopausal 01/11/2012   Obstructive sleep apnea 04/04/2010   V-tach 01/03/2009   ICD  [employer]  Single chamber 01/03/2009   PFO (patent foramen ovale) 09/29/2008   Vitamin D deficiency 08/17/2008   Hypothyroidism 11/18/2006   Hyperlipidemia 11/18/2006   Depression with anxiety 11/18/2006   Essential hypertension 11/18/2006   Mitral valve prolapse 11/18/2006   Cerebral aneurysm 11/18/2006   Allergic rhinitis 11/18/2006   GERD 11/18/2006   Diverticulosis of colon 11/18/2006   Fatty liver 11/18/2006   Fibromyalgia 11/18/2006    Past Medical History:  Diagnosis Date   AICD (automatic cardioverter/defibrillator) present    Anxiety    Arthritis    Back pain    Breast cancer ([place]) 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   Bronchiectasis ([place])    Cerebral aneurysm, nonruptured    had a clip put in   CHF (congestive heart failure) ([place])    Clostridium difficile infection    Depressive disorder, not elsewhere classified    Diverticulosis of colon (without mention of hemorrhage)    Esophageal reflux    Fatty  liver    Fibromyalgia    Gastritis    GERD (gastroesophageal reflux disease)    GI bleed 2004   Glaucoma    Hiatal hernia    History of COVID-19 05/04/2021   Hyperlipidemia    Hypertension    Hypothyroidism    Internal hemorrhoids    Joint pain     Obstructive sleep apnea (adult) (pediatric)    Osteoarthritis    Ostium secundum type atrial septal defect    Other chronic nonalcoholic liver disease    Other pulmonary embolism and infarction    Palpitations    Paroxysmal ventricular tachycardia    Personal history of radiation therapy    PONV (postoperative nausea and vomiting)    Presence of permanent cardiac pacemaker    PUD (peptic ulcer disease)    Radiation 02/03/15-03/10/15   Right Breast   Sarcoid    per pt , not sure   Schatzki's ring    Shortness of breath    Sleep apnea    Stroke ([place]) 2013   tia/ pt feels it was around 2008 0r 2009   Takotsubo syndrome    Tubular adenoma of colon    Unspecified transient cerebral ischemia    Unspecified vitamin D deficiency     Family History  Problem Relation Age of Onset   Diabetes Mother    Alzheimer's disease Mother    Hypertension Mother    Obesity Mother    Other Brother        Thyroid problem 10/2016   Allergies Sister    Heart disease Sister    Heart disease Brother    Prostate cancer Brother 66       same brother as throat cancer   Throat cancer Brother        dx in his 16s; also a smoker   Heart disease Father    Cancer Maternal Grandmother 90       colon cancer or abdominal cancer   Lung cancer Maternal Grandfather 78   Breast cancer Maternal Aunt 55   Colon cancer Maternal Aunt 61       same sister as breast at 29   Lung cancer Maternal Uncle    Breast cancer Maternal Aunt        dx in her 55s   Pancreatic cancer Cousin 60       maternal first cousin   Breast cancer Cousin        paternal first cousin twice removed died in her 80s   Heart disease Son        Cardiac Arrest 07/2016   Stroke Neg Hx    Past Surgical History:  Procedure Laterality Date   ABI  2006   normal   BREAST EXCISIONAL BIOPSY     BREAST LUMPECTOMY WITH RADIOACTIVE SEED LOCALIZATION Right 01/03/2015   Procedure: BREAST LUMPECTOMY WITH RADIOACTIVE SEED LOCALIZATION;  Surgeon: Autumn Messing III, MD;  Location: [facility];  Service: General;  Laterality: Right;   CARDIAC DEFIBRILLATOR PLACEMENT  2006; 2012   BSX single chamber ICD   carotid dopplers  2006   neg   CEREBRAL ANEURYSM REPAIR  02/1999   COLONOSCOPY     HAMMER TOE SURGERY  11/15/2020   3rd digit bilateral feet    hospitalization  2004   GI bleed, PUD, diverticulosis (EGD,colonscopy)   hospitalization     PE, NSVT, s/p defib   KNEE ARTHROSCOPY     bilateral   LEFT HEART CATHETERIZATION WITH CORONARY ANGIOGRAM  N/A 02/22/2012   Procedure: LEFT HEART CATHETERIZATION WITH CORONARY ANGIOGRAM;  Surgeon: Burnell Blanks, MD;  [facility] C[facility] LAB;  Service: Cardiovascular;  Laterality: N/A;   PARTIAL HYSTERECTOMY     Fibroids   TONGUE BIOPSY  12/12/2017   due to sore tongue and white patches/abnormal cells   TOTAL KNEE ARTHROPLASTY Left 02/13/2016   Procedure: TOTAL KNEE ARTHROPLASTY;  Surgeon: Vickey Huger, MD;  Location: [name];  Service: Orthopedics;  Laterality: Left;   TOTAL KNEE ARTHROPLASTY Right 05/22/2021   Procedure: TOTAL KNEE ARTHROPLASTY;  Surgeon: Vickey Huger, MD;  Location: [facility];  Service: Orthopedics;  Laterality: Right;  with block   UPPER GASTROINTESTINAL ENDOSCOPY     Social History   Social History Narrative   Lives alone   Caffeine NOM:VEHM   Retired from [employer]   2 children -- 5 grandbabies   Live alone   Immunization History  Administered Date(s) Administered   Fluad Quad(high Dose 65+) 04/17/2019, 04/30/2020   H1N1 07/24/2008   Influenza Split 07/17/2011, 09/27/2011, 04/29/2012   Influenza Whole 07/20/2004, 07/04/2007, 05/07/2008, 05/19/2009, 05/17/2010   Influenza, High Dose Seasonal PF 05/30/2017, 05/21/2018   Influenza,inj,Quad PF,6+ Mos 05/20/2013, 05/06/2014, 05/17/2015, 05/15/2016, 04/27/2021   Influenza-Unspecified 05/07/2018   PFIZER(Purple Top)SARS-COV-2 Vaccination 09/25/2019, 10/16/2019, 06/04/2020, 01/04/2021   Pneumococcal Conjugate-13 11/22/2015    Pneumococcal Polysaccharide-23 05/20/2002, 05/07/2008, 06/25/2014   Td 09/07/2009   Zoster, Live 09/21/2011     Objective: Vital Signs: LMP  (LMP Unknown)    Physical Exam   Musculoskeletal Exam: ***  CDAI Exam: CDAI Score: -- Patient Global: --; Provider Global: -- Swollen: --; Tender: -- Joint Exam 06/15/2021   No joint exam has been documented for this visit   There is currently no information documented on the homunculus. Go to the Rheumatology activity and complete the homunculus joint exam.  Investigation: No additional findings.  Imaging: DG Chest 2 View  Result Date: 05/18/2021 CLINICAL DATA:  70 year old female under preoperative evaluation. EXAM: CHEST - 2 VIEW COMPARISON:  Chest x-ray 10/23/2019. FINDINGS: Lung volumes are low. Diffuse peribronchial cuffing and widespread areas of interstitial prominence are again noted throughout the mid to lower lungs bilaterally. No consolidative airspace disease. No pleural effusions. No pneumothorax. No pulmonary nodule or mass noted. Pulmonary vasculature and the cardiomediastinal silhouette are within normal limits. Atherosclerosis in the thoracic aorta. Left-sided pacemaker/AICD with lead tip projecting over the expected location of the right ventricle. IMPRESSION: 1. No radiographic evidence of acute cardiopulmonary disease. 2. Low lung volumes with chronic findings in the lungs suggestive of potential interstitial lung disease. Further evaluation with nonemergent high-resolution chest CT is recommended in the near future to better evaluate these findings. 3. Aortic atherosclerosis. Electronically Signed   By: Vinnie Langton M.D.   On: 05/18/2021 13:47   CUP PACEART REMOTE DEVICE CHECK  Result Date: 06/01/2021 Scheduled remote reviewed. Normal device function.  6 NSVT this cycle, available EGM's show 4-6 beats Next remote 91 days. LR   Recent Labs: Lab Results  Component Value Date   WBC 9.7 05/23/2021   HGB 11.4 (L)  05/23/2021   PLT 221 05/23/2021   NA 140 05/23/2021   K 3.9 05/23/2021   CL 107 05/23/2021   CO2 26 05/23/2021   GLUCOSE 162 (H) 05/23/2021   BUN 7 (L) 05/23/2021   CREATININE 0.70 05/23/2021   BILITOT 0.7 04/26/2021   ALKPHOS 83 04/26/2021   AST 35 04/26/2021   ALT 34 04/26/2021   PROT 7.0 04/26/2021  ALBUMIN 3.8 04/26/2021   CALCIUM 9.4 05/23/2021   GFRAA 82 10/01/2019    Speciality Comments: Narc Agreement: 01/16/18  Procedures:  No procedures performed Allergies: Ace inhibitors, Amitriptyline hcl, Atorvastatin, Cymbalta [duloxetine hcl], Dilantin [phenytoin sodium extended], Paroxetine, Ramipril, Rosuvastatin, Carbamazepine, Codeine, Iodine-131, Paxlovid [nirmatrelvir-ritonavir], Phenytoin sodium extended, Simvastatin, and Wellbutrin [bupropion]   Assessment / Plan:     Visit Diagnoses: No diagnosis found.  Orders: No orders of the defined types were placed in this encounter.  No orders of the defined types were placed in this encounter.   Face-to-face time spent with patient was *** minutes. Greater than 50% of time was spent in counseling and coordination of care.  Follow-Up Instructions: No follow-ups on file.   Earnestine Mealing, CMA  Note - This record has been created using Editor, commissioning.  Chart creation errors have been sought, but may not always  have been located. Such creation errors do not reflect on  the standard of medical care.

## 2021-06-02 NOTE — Patient Instructions (Signed)
Zpack Prednisone taper Continue Incentive spirometer  Need labs and CXR today/tomorrow- call oncall service if abnormal  If symptoms not better needs in office follow-up

## 2021-06-02 NOTE — Progress Notes (Signed)
Virtual Visit via Video Note  I connected with [name] on 06/02/21 at  3:30 PM EDT by a video enabled telemedicine application and verified that I am speaking with the correct person using two identifiers.  Location: Patient: Home Provider: Office    I discussed the limitations of evaluation and management by telemedicine and the availability of in person appointments. The patient expressed understanding and agreed to proceed.  History of Present Illness: 70 year old female, former smoker quit 1991 (2000).  Past medical history significant of obstructive sleep apnea, bronchiectasis.  Patient of Dr. Halford Chessman, seen in office on 04/04/2021.  Previous [facility] pulmonary: 05/17/2021 Patient presents today for surgical risk assessment for total knee arthroplasty in October with Dr. Vickey Huger. She is doing well today, no acute complaints. She had covid earlier this month treated with paxlovid. She has no residual respiratory symptoms. She is moderately compliant with CPAP. Needs new machine >6 years. Humidification no longer working. Denies cough or shortness of breath.   Airview download 04/16/21-05/15/21 Usage 10/30 days used  Average usage days used 5 hours 7 mins Pressure 5cm h20 Airleak 0.8L/min AHI 4.9   06/02/2021- Interim hx  Patient contacted today for acute video visit. Transportation is an issue currently, she can not drive d/t knee surgery. Patient reports shortness of breath for the last several days. She had right total knee arthroplasty on 05/22/21, spent one night in the hospital. She has a dry cough and shortness of breath. No mucus production. She is using nebulizer 4-5 times  day with temporary improvement. She is wearing her CPAP at night. Home health checked O2 which was 87% RA and recovered with deep breathing exercises. She is using incentive spirometer 4-5 times a day, getting around 1100 (previously 1700). No hx asthma or COPD, she had covid in September. Denies  f/c/s, chest pain or tightness.    Observations/Objective:  - Appears well; Dry cough without overt shortness of breath or wheezing  Assessment and Plan:  Dyspnea: - Shortness of breath, unclear etiology. Concern for possible post op pneumonia vs PE, low suspicion but needs to be ruled out. Sending in [place] and prednisone taper. Checking CXR and d-dimer, patient will go to [facility] for testing today or tomorrow.   Follow Up Instructions:  - If symptoms not better needs in office follow-up   I discussed the assessment and treatment plan with the patient. The patient was provided an opportunity to ask questions and all were answered. The patient agreed with the plan and demonstrated an understanding of the instructions.   The patient was advised to call back or seek an in-person evaluation if the symptoms worsen or if the condition fails to improve as anticipated.  I provided 25 minutes of non-face-to-face time during this encounter.   Martyn Ehrich, NP

## 2021-06-03 ENCOUNTER — Other Ambulatory Visit: Payer: Self-pay

## 2021-06-03 ENCOUNTER — Encounter (HOSPITAL_COMMUNITY): Payer: Self-pay

## 2021-06-03 ENCOUNTER — Inpatient Hospital Stay (HOSPITAL_COMMUNITY)
Admission: EM | Admit: 2021-06-03 | Discharge: 2021-06-07 | DRG: 190 | Disposition: A | Discharge status: Home / Self Care | Payer: Medicare Other | Attending: Internal Medicine | Admitting: Internal Medicine

## 2021-06-03 ENCOUNTER — Emergency Department (HOSPITAL_COMMUNITY): Payer: Medicare Other

## 2021-06-03 DIAGNOSIS — M7989 Other specified soft tissue disorders: Secondary | ICD-10-CM | POA: Diagnosis not present

## 2021-06-03 DIAGNOSIS — Z881 Allergy status to other antibiotic agents status: Secondary | ICD-10-CM

## 2021-06-03 DIAGNOSIS — F32A Depression, unspecified: Secondary | ICD-10-CM | POA: Diagnosis present

## 2021-06-03 DIAGNOSIS — I5022 Chronic systolic (congestive) heart failure: Secondary | ICD-10-CM | POA: Diagnosis present

## 2021-06-03 DIAGNOSIS — R0602 Shortness of breath: Secondary | ICD-10-CM

## 2021-06-03 DIAGNOSIS — I428 Other cardiomyopathies: Secondary | ICD-10-CM | POA: Diagnosis present

## 2021-06-03 DIAGNOSIS — R0902 Hypoxemia: Secondary | ICD-10-CM

## 2021-06-03 DIAGNOSIS — E669 Obesity, unspecified: Secondary | ICD-10-CM | POA: Diagnosis present

## 2021-06-03 DIAGNOSIS — Z803 Family history of malignant neoplasm of breast: Secondary | ICD-10-CM

## 2021-06-03 DIAGNOSIS — K769 Liver disease, unspecified: Secondary | ICD-10-CM | POA: Diagnosis present

## 2021-06-03 DIAGNOSIS — Q2111 Secundum atrial septal defect: Secondary | ICD-10-CM | POA: Diagnosis not present

## 2021-06-03 DIAGNOSIS — K449 Diaphragmatic hernia without obstruction or gangrene: Secondary | ICD-10-CM | POA: Diagnosis present

## 2021-06-03 DIAGNOSIS — Z87891 Personal history of nicotine dependence: Secondary | ICD-10-CM

## 2021-06-03 DIAGNOSIS — Z86711 Personal history of pulmonary embolism: Secondary | ICD-10-CM

## 2021-06-03 DIAGNOSIS — I110 Hypertensive heart disease with heart failure: Secondary | ICD-10-CM | POA: Diagnosis present

## 2021-06-03 DIAGNOSIS — Z888 Allergy status to other drugs, medicaments and biological substances status: Secondary | ICD-10-CM

## 2021-06-03 DIAGNOSIS — Z8616 Personal history of COVID-19: Secondary | ICD-10-CM | POA: Diagnosis not present

## 2021-06-03 DIAGNOSIS — Z79890 Hormone replacement therapy (postmenopausal): Secondary | ICD-10-CM

## 2021-06-03 DIAGNOSIS — Z923 Personal history of irradiation: Secondary | ICD-10-CM

## 2021-06-03 DIAGNOSIS — J479 Bronchiectasis, uncomplicated: Secondary | ICD-10-CM | POA: Diagnosis present

## 2021-06-03 DIAGNOSIS — G4730 Sleep apnea, unspecified: Secondary | ICD-10-CM | POA: Diagnosis present

## 2021-06-03 DIAGNOSIS — Z853 Personal history of malignant neoplasm of breast: Secondary | ICD-10-CM

## 2021-06-03 DIAGNOSIS — M797 Fibromyalgia: Secondary | ICD-10-CM | POA: Diagnosis present

## 2021-06-03 DIAGNOSIS — J8410 Pulmonary fibrosis, unspecified: Secondary | ICD-10-CM | POA: Diagnosis present

## 2021-06-03 DIAGNOSIS — Z79891 Long term (current) use of opiate analgesic: Secondary | ICD-10-CM

## 2021-06-03 DIAGNOSIS — Z20822 Contact with and (suspected) exposure to covid-19: Secondary | ICD-10-CM | POA: Diagnosis present

## 2021-06-03 DIAGNOSIS — F419 Anxiety disorder, unspecified: Secondary | ICD-10-CM | POA: Diagnosis present

## 2021-06-03 DIAGNOSIS — Z833 Family history of diabetes mellitus: Secondary | ICD-10-CM

## 2021-06-03 DIAGNOSIS — E039 Hypothyroidism, unspecified: Secondary | ICD-10-CM | POA: Diagnosis present

## 2021-06-03 DIAGNOSIS — G2581 Restless legs syndrome: Secondary | ICD-10-CM | POA: Diagnosis present

## 2021-06-03 DIAGNOSIS — E785 Hyperlipidemia, unspecified: Secondary | ICD-10-CM | POA: Diagnosis present

## 2021-06-03 DIAGNOSIS — K760 Fatty (change of) liver, not elsewhere classified: Secondary | ICD-10-CM | POA: Diagnosis present

## 2021-06-03 DIAGNOSIS — Z79899 Other long term (current) drug therapy: Secondary | ICD-10-CM

## 2021-06-03 DIAGNOSIS — Z8249 Family history of ischemic heart disease and other diseases of the circulatory system: Secondary | ICD-10-CM

## 2021-06-03 DIAGNOSIS — I700 Atherosclerosis of aorta: Secondary | ICD-10-CM | POA: Diagnosis present

## 2021-06-03 DIAGNOSIS — J309 Allergic rhinitis, unspecified: Secondary | ICD-10-CM | POA: Diagnosis present

## 2021-06-03 DIAGNOSIS — Z6833 Body mass index (BMI) 33.0-33.9, adult: Secondary | ICD-10-CM | POA: Diagnosis not present

## 2021-06-03 DIAGNOSIS — D869 Sarcoidosis, unspecified: Secondary | ICD-10-CM | POA: Diagnosis present

## 2021-06-03 DIAGNOSIS — J189 Pneumonia, unspecified organism: Secondary | ICD-10-CM | POA: Diagnosis present

## 2021-06-03 DIAGNOSIS — K219 Gastro-esophageal reflux disease without esophagitis: Secondary | ICD-10-CM | POA: Diagnosis present

## 2021-06-03 DIAGNOSIS — E559 Vitamin D deficiency, unspecified: Secondary | ICD-10-CM | POA: Diagnosis present

## 2021-06-03 DIAGNOSIS — Z885 Allergy status to narcotic agent status: Secondary | ICD-10-CM

## 2021-06-03 DIAGNOSIS — Z90711 Acquired absence of uterus with remaining cervical stump: Secondary | ICD-10-CM

## 2021-06-03 DIAGNOSIS — Z171 Estrogen receptor negative status [ER-]: Secondary | ICD-10-CM

## 2021-06-03 DIAGNOSIS — Z8673 Personal history of transient ischemic attack (TIA), and cerebral infarction without residual deficits: Secondary | ICD-10-CM

## 2021-06-03 DIAGNOSIS — G4733 Obstructive sleep apnea (adult) (pediatric): Secondary | ICD-10-CM | POA: Diagnosis present

## 2021-06-03 DIAGNOSIS — Z96653 Presence of artificial knee joint, bilateral: Secondary | ICD-10-CM | POA: Diagnosis present

## 2021-06-03 DIAGNOSIS — M1990 Unspecified osteoarthritis, unspecified site: Secondary | ICD-10-CM | POA: Diagnosis present

## 2021-06-03 DIAGNOSIS — H409 Unspecified glaucoma: Secondary | ICD-10-CM | POA: Diagnosis present

## 2021-06-03 DIAGNOSIS — Z7982 Long term (current) use of aspirin: Secondary | ICD-10-CM

## 2021-06-03 DIAGNOSIS — J9601 Acute respiratory failure with hypoxia: Secondary | ICD-10-CM | POA: Diagnosis present

## 2021-06-03 DIAGNOSIS — Z95810 Presence of automatic (implantable) cardiac defibrillator: Secondary | ICD-10-CM

## 2021-06-03 HISTORY — DX: Pneumonia, unspecified organism: J18.9

## 2021-06-03 LAB — RESP PANEL BY RT-PCR (FLU A&B, COVID) ARPGX2
Influenza A by PCR: NEGATIVE
Influenza B by PCR: NEGATIVE
SARS Coronavirus 2 by RT PCR: NEGATIVE

## 2021-06-03 LAB — CBC WITH DIFFERENTIAL/PLATELET
Abs Immature Granulocytes: 0.19 10*3/uL — ABNORMAL HIGH (ref 0.00–0.07)
Basophils Absolute: 0.1 10*3/uL (ref 0.0–0.1)
Basophils Relative: 1 %
Eosinophils Absolute: 0.2 10*3/uL (ref 0.0–0.5)
Eosinophils Relative: 2 %
HCT: 35.2 % — ABNORMAL LOW (ref 36.0–46.0)
Hemoglobin: 10.8 g/dL — ABNORMAL LOW (ref 12.0–15.0)
Immature Granulocytes: 2 %
Lymphocytes Relative: 8 %
Lymphs Abs: 0.8 10*3/uL (ref 0.7–4.0)
MCH: 29.4 pg (ref 26.0–34.0)
MCHC: 30.7 g/dL (ref 30.0–36.0)
MCV: 95.9 fL (ref 80.0–100.0)
Monocytes Absolute: 0.7 10*3/uL (ref 0.1–1.0)
Monocytes Relative: 7 %
Neutro Abs: 8.4 10*3/uL — ABNORMAL HIGH (ref 1.7–7.7)
Neutrophils Relative %: 80 %
Platelets: 483 10*3/uL — ABNORMAL HIGH (ref 150–400)
RBC: 3.67 MIL/uL — ABNORMAL LOW (ref 3.87–5.11)
RDW: 14 % (ref 11.5–15.5)
WBC: 10.4 10*3/uL (ref 4.0–10.5)
nRBC: 0.4 % — ABNORMAL HIGH (ref 0.0–0.2)

## 2021-06-03 LAB — TROPONIN I (HIGH SENSITIVITY)
Troponin I (High Sensitivity): 13 ng/L (ref <18)
Troponin I (High Sensitivity): 13 ng/L (ref <18)

## 2021-06-03 LAB — LACTATE DEHYDROGENASE: LDH: 367 U/L — ABNORMAL HIGH (ref 98–192)

## 2021-06-03 LAB — BASIC METABOLIC PANEL
Anion gap: 9 (ref 5–15)
BUN: 12 mg/dL (ref 8–23)
CO2: 23 mmol/L (ref 22–32)
Calcium: 9.6 mg/dL (ref 8.9–10.3)
Chloride: 102 mmol/L (ref 98–111)
Creatinine, Ser: 0.86 mg/dL (ref 0.44–1.00)
GFR, Estimated: >60 mL/min (ref >60)
Glucose, Bld: 134 mg/dL — ABNORMAL HIGH (ref 70–99)
Potassium: 4.9 mmol/L (ref 3.5–5.1)
Sodium: 134 mmol/L — ABNORMAL LOW (ref 135–145)

## 2021-06-03 LAB — BRAIN NATRIURETIC PEPTIDE: B Natriuretic Peptide: 24.6 pg/mL (ref 0.0–100.0)

## 2021-06-03 LAB — TRIGLYCERIDES: Triglycerides: 116 mg/dL (ref <150)

## 2021-06-03 LAB — FIBRINOGEN: Fibrinogen: 674 mg/dL — ABNORMAL HIGH (ref 210–475)

## 2021-06-03 LAB — LACTIC ACID, PLASMA: Lactic Acid, Venous: 1.1 mmol/L (ref 0.5–1.9)

## 2021-06-03 LAB — PROCALCITONIN: Procalcitonin: <0.1 ng/mL

## 2021-06-03 LAB — C-REACTIVE PROTEIN: CRP: 11.4 mg/dL — ABNORMAL HIGH (ref <1.0)

## 2021-06-03 LAB — MRSA NEXT GEN BY PCR, NASAL: MRSA by PCR Next Gen: NOT DETECTED

## 2021-06-03 LAB — D-DIMER, QUANTITATIVE: D-Dimer, Quant: 7.17 ug/mL-FEU — ABNORMAL HIGH (ref 0.00–0.50)

## 2021-06-03 LAB — FERRITIN: Ferritin: 150 ng/mL (ref 11–307)

## 2021-06-03 MED ORDER — ACETAMINOPHEN 325 MG PO TABS
650.0000 mg | ORAL_TABLET | Freq: Four times a day (QID) | ORAL | Status: DC | PRN
  Administered 2021-06-04 – 2021-06-07 (×9): 650 mg via ORAL
  Filled 2021-06-03 (×9): qty 2

## 2021-06-03 MED ORDER — LEVOTHYROXINE SODIUM 50 MCG PO TABS
50.0000 ug | ORAL_TABLET | Freq: Every day | ORAL | Status: DC
  Administered 2021-06-04 – 2021-06-07 (×4): 50 ug via ORAL
  Filled 2021-06-03 (×4): qty 1

## 2021-06-03 MED ORDER — ALBUTEROL SULFATE (2.5 MG/3ML) 0.083% IN NEBU: 2.5000 mg | INHALATION_SOLUTION | Freq: Four times a day (QID) | RESPIRATORY_TRACT | Status: DC | PRN

## 2021-06-03 MED ORDER — HYDROCODONE-ACETAMINOPHEN 5-325 MG PO TABS: 1.0000 | ORAL_TABLET | Freq: Four times a day (QID) | ORAL | Status: DC | PRN

## 2021-06-03 MED ORDER — BACLOFEN 10 MG PO TABS
10.0000 mg | ORAL_TABLET | Freq: Every evening | ORAL | Status: DC | PRN
  Administered 2021-06-04 – 2021-06-05 (×2): 10 mg via ORAL
  Filled 2021-06-03 (×3): qty 1

## 2021-06-03 MED ORDER — TRAMADOL HCL 50 MG PO TABS
50.0000 mg | ORAL_TABLET | Freq: Two times a day (BID) | ORAL | Status: DC | PRN
Start: 2021-06-03 — End: 2021-06-07
  Administered 2021-06-03 – 2021-06-06 (×5): 50 mg via ORAL
  Filled 2021-06-03 (×5): qty 1

## 2021-06-03 MED ORDER — CARVEDILOL 12.5 MG PO TABS
12.5000 mg | ORAL_TABLET | Freq: Two times a day (BID) | ORAL | Status: DC
  Administered 2021-06-03 – 2021-06-07 (×8): 12.5 mg via ORAL
  Filled 2021-06-03 (×8): qty 1

## 2021-06-03 MED ORDER — ACETAMINOPHEN 650 MG RE SUPP: 650.0000 mg | Freq: Four times a day (QID) | RECTAL | Status: DC | PRN

## 2021-06-03 MED ORDER — ZOLPIDEM TARTRATE 5 MG PO TABS
5.0000 mg | ORAL_TABLET | Freq: Every day | ORAL | Status: DC
  Administered 2021-06-03 – 2021-06-06 (×4): 5 mg via ORAL
  Filled 2021-06-03 (×4): qty 1

## 2021-06-03 MED ORDER — MONTELUKAST SODIUM 10 MG PO TABS
10.0000 mg | ORAL_TABLET | Freq: Every day | ORAL | Status: DC
  Administered 2021-06-03 – 2021-06-06 (×4): 10 mg via ORAL
  Filled 2021-06-03 (×4): qty 1

## 2021-06-03 MED ORDER — DOXYCYCLINE HYCLATE 100 MG PO TABS
100.0000 mg | ORAL_TABLET | Freq: Once | ORAL | Status: AC
  Administered 2021-06-03: 100 mg via ORAL
  Filled 2021-06-03: qty 1

## 2021-06-03 MED ORDER — CEFTRIAXONE SODIUM 2 G IJ SOLR
2.0000 g | INTRAMUSCULAR | Status: DC
  Administered 2021-06-04 – 2021-06-05 (×2): 2 g via INTRAVENOUS
  Filled 2021-06-03 (×2): qty 20

## 2021-06-03 MED ORDER — TRAZODONE HCL 50 MG PO TABS
50.0000 mg | ORAL_TABLET | Freq: Every evening | ORAL | Status: DC | PRN
  Administered 2021-06-05 – 2021-06-06 (×2): 50 mg via ORAL
  Filled 2021-06-03 (×2): qty 1

## 2021-06-03 MED ORDER — FLUTICASONE PROPIONATE 50 MCG/ACT NA SUSP
2.0000 | Freq: Every day | NASAL | Status: DC
  Administered 2021-06-03 – 2021-06-07 (×5): 2 via NASAL
  Filled 2021-06-03: qty 16

## 2021-06-03 MED ORDER — SODIUM CHLORIDE 0.9 % IV SOLN
1.0000 g | Freq: Once | INTRAVENOUS | Status: AC
  Administered 2021-06-03: 1 g via INTRAVENOUS
  Filled 2021-06-03: qty 10

## 2021-06-03 MED ORDER — ENOXAPARIN SODIUM 40 MG/0.4ML IJ SOSY
40.0000 mg | PREFILLED_SYRINGE | INTRAMUSCULAR | Status: DC
  Administered 2021-06-03 – 2021-06-06 (×4): 40 mg via SUBCUTANEOUS
  Filled 2021-06-03 (×4): qty 0.4

## 2021-06-03 MED ORDER — LATANOPROST 0.005 % OP SOLN
1.0000 [drp] | Freq: Every day | OPHTHALMIC | Status: DC
  Administered 2021-06-03 – 2021-06-06 (×4): 1 [drp] via OPHTHALMIC
  Filled 2021-06-03: qty 2.5

## 2021-06-03 MED ORDER — POLYVINYL ALCOHOL 1.4 % OP SOLN
1.0000 [drp] | Freq: Every day | OPHTHALMIC | Status: DC
  Administered 2021-06-03 – 2021-06-06 (×4): 1 [drp] via OPHTHALMIC
  Filled 2021-06-03: qty 15

## 2021-06-03 MED ORDER — SODIUM CHLORIDE 0.9 % IV SOLN
500.0000 mg | INTRAVENOUS | Status: DC
  Administered 2021-06-04 – 2021-06-05 (×2): 500 mg via INTRAVENOUS
  Filled 2021-06-03 (×2): qty 500

## 2021-06-03 MED ORDER — IOHEXOL 350 MG/ML SOLN
80.0000 mL | Freq: Once | INTRAVENOUS | Status: AC | PRN
  Administered 2021-06-03: 80 mL via INTRAVENOUS

## 2021-06-03 MED ORDER — PANTOPRAZOLE SODIUM 40 MG PO TBEC
40.0000 mg | DELAYED_RELEASE_TABLET | Freq: Every day | ORAL | Status: DC
  Administered 2021-06-03 – 2021-06-07 (×5): 40 mg via ORAL
  Filled 2021-06-03 (×5): qty 1

## 2021-06-03 MED ORDER — LORATADINE 10 MG PO TABS
10.0000 mg | ORAL_TABLET | Freq: Every day | ORAL | Status: DC
  Administered 2021-06-03 – 2021-06-07 (×5): 10 mg via ORAL
  Filled 2021-06-03 (×5): qty 1

## 2021-06-03 MED ORDER — GUAIFENESIN ER 600 MG PO TB12
600.0000 mg | ORAL_TABLET | Freq: Two times a day (BID) | ORAL | Status: DC
  Administered 2021-06-03 – 2021-06-07 (×9): 600 mg via ORAL
  Filled 2021-06-03 (×9): qty 1

## 2021-06-03 MED ORDER — EZETIMIBE 10 MG PO TABS
10.0000 mg | ORAL_TABLET | Freq: Every day | ORAL | Status: DC
  Administered 2021-06-04 – 2021-06-07 (×4): 10 mg via ORAL
  Filled 2021-06-03 (×4): qty 1

## 2021-06-03 NOTE — ED Triage Notes (Signed)
Pt presents with c/o shortness of breath. Pt reports that she recently had knee surgery on 10/3. Pt reports that 2 days ago, they switched her from oxycodone to hydrocodone for pain and since then, she has been short of breath. Pt does appear to have some labored breathing while talking but is stable in triage.

## 2021-06-03 NOTE — ED Provider Notes (Signed)
I confirmed with the patient that she does not in fact have iodine allergies.  She reports she has had contrast scans in the past with no adverse reactions.  There was 1 occasion where the IV may have infiltrated and she had some localized swelling and edema, but denies any hives, throat swelling, or any other signs of anaphylaxis or true allergic reaction.  I have therefore advised the CT technician to proceed with CT PE.   Wyvonnia Dusky, MD 06/03/21 1011

## 2021-06-03 NOTE — ED Notes (Signed)
Per lab BMP is running.

## 2021-06-03 NOTE — ED Provider Notes (Signed)
[facility] DEPT Provider Note   CSN: [id number] Arrival date & time: 06/03/21  0900     History Chief Complaint  Patient presents with   Shortness of Breath    [name] is a 70 y.o. female with history of congestive heart failure status post AICD, bronchiectasis, recent right knee surgery (05/22/21), untreated 25 mg of aspirin, presenting to emergency department with shortness of breath.  The patient reports that she began feeling very winded and short of breath about 2 to 3 days ago.  She says she has no energy to get around the house.  She has a difficult time taking her breath.  She reports "when I try to breathe it's like I'm not getting in air".  She does have a history of both bronchiectasis and congestive heart failure but this feels different to her.  She denies any leg swelling or weight gain which she typically has with heart failure, and reports she only takes diuretics as needed.  She did talk to her pulmonologist by phone or virtual visit yesterday, and was prescribed prednisone, which she has not started taking, as well as azithromycin empirically.  She has taken 1 dose of the antibiotic.  She was advised she may need further work-up for possible blood clots and is now coming to the ED.  She has no prior history of DVT or PE.  She denies any chest pain or pressure.  She reports she has a chronic cough which she feels is worsening recently, but denies fevers or chills.  She denies any other symptoms.  She reports normally her oxygen level is above 95%, but she was told by a home nurse that her level was 87% the other day.  She does not wear oxygen.  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   Bronchiectasis ([place])    Cerebral aneurysm, nonruptured    had a clip  put in   CHF (congestive heart failure) (HCC)    Clostridium difficile infection    Depressive disorder, not elsewhere classified    Diverticulosis of colon (without mention of hemorrhage)    Esophageal reflux    Fatty liver    Fibromyalgia    Gastritis    GERD (gastroesophageal reflux disease)    GI bleed 2004   Glaucoma    Hiatal hernia    History of COVID-19 05/04/2021   Hyperlipidemia    Hypertension    Hypothyroidism    Internal hemorrhoids    Joint pain    Multifocal pneumonia 06/03/2021   Obstructive sleep apnea (adult) (pediatric)    Osteoarthritis    Ostium secundum type atrial septal defect    Other chronic nonalcoholic liver disease    Other pulmonary embolism and infarction    Palpitations    Paroxysmal ventricular tachycardia    Personal history of radiation therapy    PONV (postoperative nausea and vomiting)    Presence of permanent cardiac pacemaker    PUD (peptic ulcer disease)    Radiation 02/03/15-03/10/15   Right Breast   Sarcoid    per pt , not sure   Schatzki's ring    Shortness of breath    Sleep apnea    Stroke [facility]) 2013   tia/ pt feels it was around  2008 0r 2009   Takotsubo syndrome    Tubular adenoma of colon    Unspecified transient cerebral ischemia    Unspecified vitamin D deficiency     Patient Active Problem List   Diagnosis Date Noted   Multifocal pneumonia 06/03/2021   Pre-operative respiratory examination 05/18/2021   Statin myopathy 04/19/2021   Pain in joint of right shoulder 02/10/2021   Arthritis of both acromioclavicular joints 01/02/2021   Trochanteric bursitis of right hip 10/03/2020   Tailbone injury 05/05/2020   UTI (urinary tract infection) 05/05/2020   Insomnia 05/05/2020   Leg cramps 02/16/2020   Dysuria 02/16/2020   Right hip impingement syndrome 02/01/2020   Right bicipital tenosynovitis 02/01/2020   Bronchiectasis without complication ([place]) 12/45/8099   Myofascial pain 12/21/2019   Estrogen deficiency  12/10/2019   Obesity (BMI 30-39.9) 12/10/2019   Bronchiectasis with (acute) exacerbation ([place]) 10/23/2019   Elevated LFTs 10/15/2019   Lower abdominal pain 09/01/2019   Neck pain 10/25/2017   Paresthesias in left hand 10/25/2017   Paresthesia of foot, bilateral 04/12/2017   Primary osteoarthritis of both hands 09/27/2016   Primary osteoarthritis of both knees 09/27/2016   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   Nasopharyngitis, 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/2016   S/P total knee replacement 02/13/2016   Chronic pain of both knees 12/06/2015   Globus pharyngeus 05/17/2015   Genetic testing 12/23/2014   Family history of breast cancer    Family history of colon cancer    Family history of pancreatic cancer    History of breast cancer 11/23/2014   Allergy to ACE inhibitors 09/27/2014   Prediabetes 06/23/2013   RLS (restless legs syndrome) 01/15/2013   Hx of Clostridium difficile infection 10/10/2012   Dyspnea on exertion 04/02/2012   NICM (nonischemic cardiomyopathy) ([place]) 03/07/2012   Post-menopausal 01/11/2012   Obstructive sleep apnea 04/04/2010   V-tach 01/03/2009   ICD  [employer]  Single chamber 01/03/2009   PFO (patent foramen ovale) 09/29/2008   Vitamin D deficiency 08/17/2008   Hypothyroidism 11/18/2006   Hyperlipidemia 11/18/2006   Depression with anxiety 11/18/2006   Essential hypertension 11/18/2006   Mitral valve prolapse 11/18/2006   Cerebral aneurysm 11/18/2006   Allergic rhinitis 11/18/2006   GERD 11/18/2006   Diverticulosis of colon 11/18/2006   Fatty liver 11/18/2006   Fibromyalgia 11/18/2006    Past Surgical History:  Procedure Laterality Date   ABI  2006   normal   BREAST EXCISIONAL BIOPSY     BREAST LUMPECTOMY WITH RADIOACTIVE SEED LOCALIZATION Right 01/03/2015   Procedure: BREAST LUMPECTOMY WITH RADIOACTIVE SEED LOCALIZATION;  Surgeon: Autumn Messing III, MD;  Location: [facility];  Service: General;  Laterality: Right;   CARDIAC DEFIBRILLATOR PLACEMENT  2006;  2012   BSX single chamber ICD   carotid dopplers  2006   neg   CEREBRAL ANEURYSM REPAIR  02/1999   COLONOSCOPY     HAMMER TOE SURGERY  11/15/2020   3rd digit bilateral feet    hospitalization  2004   GI bleed, PUD, diverticulosis (EGD,colonscopy)   hospitalization     PE, NSVT, s/p defib   KNEE ARTHROSCOPY     bilateral   LEFT HEART CATHETERIZATION WITH CORONARY ANGIOGRAM N/A 02/22/2012   Procedure: LEFT HEART CATHETERIZATION WITH CORONARY ANGIOGRAM;  Surgeon: Burnell Blanks, MD;  Location: [facility] CATH LAB;  Service: Cardiovascular;  Laterality: N/A;   PARTIAL HYSTERECTOMY     Fibroids   TONGUE BIOPSY  12/12/2017   due to sore tongue and white patches/abnormal cells  TOTAL KNEE ARTHROPLASTY Left 02/13/2016   Procedure: TOTAL KNEE ARTHROPLASTY;  Surgeon: Vickey Huger, MD;  Location: [facility];  Service: Orthopedics;  Laterality: Left;   TOTAL KNEE ARTHROPLASTY Right 05/22/2021   Procedure: TOTAL KNEE ARTHROPLASTY;  Surgeon: Vickey Huger, MD;  Location: [facility];  Service: Orthopedics;  Laterality: Right;  with block   UPPER GASTROINTESTINAL ENDOSCOPY       OB History     Gravida  2   Para      Term      Preterm      AB      Living         SAB      IAB      Ectopic      Multiple      Live Births              Family History  Problem Relation Age of Onset   Diabetes Mother    Alzheimer's disease Mother    Hypertension Mother    Obesity Mother    Other Brother        Thyroid problem 10/2016   Allergies Sister    Heart disease Sister    Heart disease Brother    Prostate cancer Brother 55       same brother as throat cancer   Throat cancer Brother        dx in his 38s; also a smoker   Heart disease Father    Cancer Maternal Grandmother 29       colon cancer or abdominal cancer   Lung cancer Maternal Grandfather 78   Breast cancer Maternal Aunt 55   Colon cancer Maternal Aunt 61       same sister as breast at 23   Lung cancer Maternal Uncle    Breast  cancer Maternal Aunt        dx in her 57s   Pancreatic cancer Cousin 37       maternal first cousin   Breast cancer Cousin        paternal first cousin twice removed died in her 50s   Heart disease Son        Cardiac Arrest 07/2016   Stroke Neg Hx     Social History   Tobacco Use   Smoking status: Former    Packs/day: 1.00    Years: 15.00    Pack years: 15.00    Types: Cigarettes    Quit date: 08/20/1989    Years since quitting: 31.8   Smokeless tobacco: Never   Tobacco comments:    Started at 61; less than 1 PPD  Vaping Use   Vaping Use: Never used  Substance Use Topics   Alcohol use: Yes    Comment: occas   Drug use: Not Currently    Types: Marijuana    Comment: marijuana candy last used 2 months ago    Home Medications Prior to Admission medications   Medication Sig Start Date End Date Taking? Authorizing Provider  acetaminophen (TYLENOL) 500 MG tablet Take 500 mg by mouth every 6 (six) hours as needed for moderate pain.   Yes [provider]  albuterol (PROVENTIL) (2.5 MG/3ML) 0.083% nebulizer solution USE 1 VIAL IN NEBULIZER EVERY 6 HOURS Patient taking differently: Take 2.5 mg by nebulization every 6 (six) hours as needed for shortness of breath. 10/11/20  Yes Chesley Mires, MD  albuterol (VENTOLIN HFA) 108 (90 Base) MCG/ACT inhaler Inhale 2 puffs into the lungs every 6 (  six) hours as needed for wheezing or shortness of breath. 04/04/21  Yes Chesley Mires, MD  Ascorbic Acid (VITAMIN C) 1000 MG tablet Take 1,000 mg by mouth daily.   Yes [provider]  aspirin EC 325 MG EC tablet Take 1 tablet (325 mg total) by mouth 2 (two) times daily. 05/23/21  Yes Donia Ast, [place]  azithromycin [facility]) 250 MG tablet Zpack taper as directed 06/02/21  Yes Martyn Ehrich, NP  baclofen (LIORESAL) 10 MG tablet TAKE 1 TABLET BY MOUTH AT BEDTIME AS NEEDED FOR MUSCLE SPASM Patient taking differently: Take 10 mg by mouth at bedtime as needed for muscle spasms.  01/05/21  Yes Deveshwar, Abel Presto, MD  calcium carbonate (OS-CAL - DOSED IN MG OF ELEMENTAL CALCIUM) 1250 (500 Ca) MG tablet Take 1 tablet by mouth daily.   Yes [provider]  carvedilol (COREG) 12.5 MG tablet Take 1 tablet (12.5 mg total) by mouth 2 (two) times daily. 12/21/20  Yes Baldwin Jamaica, PA-C  cetirizine (ZYRTEC) 10 MG tablet Take 10 mg by mouth daily as needed for allergies.   Yes [provider]  Cholecalciferol (DIALYVITE VITAMIN D 5000) 125 MCG (5000 UT) capsule Take 5,000 Units by mouth daily.   Yes [provider]  diclofenac Sodium (VOLTAREN) 1 % GEL Apply 2-4 grams to affected joint 4 times daily as needed. Patient taking differently: Apply 2-4 g topically 4 (four) times daily as needed (joint pain). 11/15/20  Yes Ofilia Neas, PA-C  fexofenadine (ALLEGRA) 180 MG tablet Take 180 mg by mouth daily as needed for allergies (alternates with zyrtec sometimes).   Yes [provider]  fluticasone (FLONASE) 50 MCG/ACT nasal spray Place 2 sprays into both nostrils daily as needed for allergies or rhinitis. Patient taking differently: Place 2 sprays into both nostrils daily. 12/27/20  Yes Inda Coke, PA  furosemide (LASIX) 40 MG tablet Take 20-40 mg by mouth as needed for edema.   Yes [provider]  HYDROcodone-acetaminophen (NORCO/VICODIN) 5-325 MG tablet Take 1 tablet by mouth every 6 (six) hours as needed for pain. 06/01/21  Yes [provider]  hydroxypropyl methylcellulose / hypromellose (ISOPTO TEARS / GONIOVISC) 2.5 % ophthalmic solution Place 1 drop into both eyes at bedtime.   Yes [provider]  latanoprost (XALATAN) 0.005 % ophthalmic solution Place 1 drop into both eyes at bedtime.   Yes [provider]  levothyroxine (SYNTHROID) 50 MCG tablet Take 1 tablet (50 mcg total) by mouth daily. 03/23/21  Yes Briscoe Deutscher, DO  magnesium oxide (MAG-OX) 400 MG tablet Take 1 tablet (400 mg total) by mouth in the  morning, at noon, and at bedtime. Patient taking differently: Take 400 mg by mouth 2 (two) times daily. 10/03/20  Yes Lovorn, Jinny Blossom, MD  methocarbamol (ROBAXIN) 500 MG tablet Take 1-2 tablets (500-1,000 mg total) by mouth every 6 (six) hours as needed for muscle spasms. 05/23/21  Yes Donia Ast, PA  mometasone (ELOCON) 0.1 % lotion Apply 1 application topically daily as needed (rash). 04/27/21  Yes Minette Brine, Amy J, NP  montelukast (SINGULAIR) 10 MG tablet Take 1 tablet (10 mg total) by mouth at bedtime. 04/04/21  Yes Chesley Mires, MD  Multiple Vitamins-Minerals (MULTIVITAMIN PO) Take 1 tablet by mouth daily.   Yes [provider]  nitroGLYCERIN (NITROSTAT) 0.4 MG SL tablet DISSOLVE ONE TABLET UNDER THE TONGUE EVERY 5 MINUTES AS NEEDED FOR CHEST PAIN.  DO NOT EXCEED A TOTAL OF 3 DOSES IN 15 MINUTES Patient  taking differently: Place 0.4 mg under the tongue every 5 (five) minutes as needed for chest pain. 05/04/21  Yes Bhagat, Bhavinkumar, PA  pantoprazole (PROTONIX) 40 MG tablet Take 1 tablet (40 mg total) by mouth daily. Patient taking differently: Take 40 mg by mouth See admin instructions. Take 40 mg daily, may take a second 40 mg dose as needed for acid reflux 02/01/21  Yes Pyrtle, Lajuan Lines, MD  phenol (CHLORASEPTIC) 1.4 % LIQD Use as directed 1 spray in the mouth or throat 3 (three) times daily as needed for throat irritation / pain. 05/11/21  Yes Jaynee Eagles, PA-C  polyethylene glycol powder (GLYCOLAX/MIRALAX) powder Take 17 grams by mouth daily 02/27/16  Yes Pyrtle, Lajuan Lines, MD  potassium chloride (KLOR-CON) 10 MEQ tablet TAKE 1/2 TABLET EVERY DAY Patient taking differently: Take 5 mEq by mouth daily as needed (when taking lasix). 04/18/21  Yes Jettie Booze, MD  sodium chloride HYPERTONIC 3 % nebulizer solution Take by nebulization 2 (two) times daily as needed for cough. 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 08/05/19  Yes Chesley Mires, MD  traMADol (ULTRAM) 50 MG tablet Take 1 tablet (50 mg total) by  mouth 2 (two) times daily. Patient taking differently: Take 50 mg by mouth 2 (two) times daily as needed for moderate pain. 03/29/21  Yes Lovorn, Jinny Blossom, MD  traZODone (DESYREL) 50 MG tablet Take 0.5-1 tablets (25-50 mg total) by mouth at bedtime as needed for sleep. Patient taking differently: Take 50 mg by mouth at bedtime as needed for sleep. 04/20/21  Yes Briscoe Deutscher, DO  zolpidem (AMBIEN) 5 MG tablet Take 1 tablet (5 mg total) by mouth at bedtime as needed for sleep. Patient taking differently: Take 5 mg by mouth at bedtime. 03/21/21  Yes Vivi Barrack, MD  ezetimibe (ZETIA) 10 MG tablet Take 1 tablet (10 mg total) by mouth daily. 05/03/21   Jettie Booze, MD  oxyCODONE (OXY IR/ROXICODONE) 5 MG immediate release tablet Take 1 tablet (5 mg total) by mouth every 4 (four) hours as needed for moderate pain (pain score 4-6). Max 6 pills in 24 hrs Patient not taking: Reported on 06/03/2021 05/23/21   Donia Ast, PA  predniSONE (DELTASONE) 10 MG tablet Take 4 tabs po daily x 2 days; then 2 tabs for 2 days; then 1 tab for 2 days 06/02/21   Martyn Ehrich, NP  Respiratory Therapy Supplies (FLUTTER) DEVI 1 Device by Does not apply route as needed. 09/22/18   Fenton Foy, NP    Allergies    Ace inhibitors, Amitriptyline hcl, Atorvastatin, Cymbalta [duloxetine hcl], Dilantin [phenytoin sodium extended], Paroxetine, Ramipril, Rosuvastatin, Carbamazepine, Codeine, Paxlovid [nirmatrelvir-ritonavir], Phenytoin sodium extended, Simvastatin, and Wellbutrin [bupropion]  Review of Systems   Review of Systems  Constitutional:  Negative for chills and fever.  Eyes:  Negative for pain and visual disturbance.  Respiratory:  Positive for cough and shortness of breath.   Cardiovascular:  Negative for chest pain and palpitations.  Gastrointestinal:  Negative for abdominal pain and vomiting.  Genitourinary:  Negative for dysuria and hematuria.  Musculoskeletal:  Negative for arthralgias and back  pain.  Skin:  Negative for color change and rash.  Neurological:  Negative for syncope and headaches.  All other systems reviewed and are negative.  Physical Exam Updated Vital Signs BP (!) 148/83 (BP Location: Right Arm)   Pulse 93   Temp 97.7 F (36.5 C) (Oral)   Resp 18   LMP  (LMP Unknown)   SpO2 (!) 88%  Physical Exam Constitutional:      General: She is not in acute distress.    Appearance: She is obese.  HENT:     Head: Normocephalic and atraumatic.  Eyes:     Conjunctiva/sclera: Conjunctivae normal.     Pupils: Pupils are equal, round, and reactive to light.  Cardiovascular:     Rate and Rhythm: Normal rate and regular rhythm.  Pulmonary:     Effort: Pulmonary effort is normal. No respiratory distress.     Comments: Crackles lung bases bilaterally RR 28 90-92% on room air at rest Abdominal:     General: There is no distension.     Tenderness: There is no abdominal tenderness.  Musculoskeletal:     Comments: Mild right leg swelling post-operatively Mild bilateral pitting edema  Skin:    General: Skin is warm and dry.  Neurological:     General: No focal deficit present.     Mental Status: She is alert. Mental status is at baseline.  Psychiatric:        Mood and Affect: Mood normal.        Behavior: Behavior normal.    ED Results / Procedures / Treatments   Labs (all labs ordered are listed, but only abnormal results are displayed) Labs Reviewed  CBC WITH DIFFERENTIAL/PLATELET - Abnormal; Notable for the following components:      Result Value   RBC 3.67 (*)    Hemoglobin 10.8 (*)    HCT 35.2 (*)    Platelets 483 (*)    nRBC 0.4 (*)    Neutro Abs 8.4 (*)    Abs Immature Granulocytes 0.19 (*)    All other components within normal limits  BASIC METABOLIC PANEL - Abnormal; Notable for the following components:   Sodium 134 (*)    Glucose, Bld 134 (*)    All other components within normal limits  D-DIMER, QUANTITATIVE - Abnormal; Notable for the  following components:   D-Dimer, Quant 7.17 (*)    All other components within normal limits  LACTATE DEHYDROGENASE - Abnormal; Notable for the following components:   LDH 367 (*)    All other components within normal limits  FIBRINOGEN - Abnormal; Notable for the following components:   Fibrinogen 674 (*)    All other components within normal limits  RESP PANEL BY RT-PCR (FLU A&B, COVID) ARPGX2  CULTURE, BLOOD (ROUTINE X 2)  CULTURE, BLOOD (ROUTINE X 2)  MRSA NEXT GEN BY PCR, NASAL  EXPECTORATED SPUTUM ASSESSMENT W GRAM STAIN, RFLX TO RESP C  BRAIN NATRIURETIC PEPTIDE  LACTIC ACID, PLASMA  LACTIC ACID, PLASMA  PROCALCITONIN  FERRITIN  TRIGLYCERIDES  C-REACTIVE PROTEIN  CBC  CREATININE, SERUM  LEGIONELLA PNEUMOPHILA SEROGP 1 UR AG  STREP PNEUMONIAE URINARY ANTIGEN  TROPONIN I (HIGH SENSITIVITY)  TROPONIN I (HIGH SENSITIVITY)    EKG EKG Interpretation  Date/Time:  Saturday June 03 2021 09:15:53 EDT Ventricular Rate:  81 PR Interval:  171 QRS Duration: 83 QT Interval:  349 QTC Calculation: 406 R Axis:   27 Text Interpretation: Sinus rhythm Borderline T abnormalities, anterior leads NO sig change from prior tracing Sept 7 2022, anterior T wave inversions noted on prior tracing, no STEMI Confirmed by Octaviano Glow [phone]) on 06/03/2021 10:15:45 AM  Radiology CT Angio Chest PE W/Cm &/Or Wo Cm  Result Date: 06/03/2021 CLINICAL DATA:  Shortness of breath. EXAM: CT ANGIOGRAPHY CHEST WITH CONTRAST TECHNIQUE: Multidetector CT imaging of the chest was performed using the standard protocol during bolus administration  of intravenous contrast. Multiplanar CT image reconstructions and MIPs were obtained to evaluate the vascular anatomy. CONTRAST:  40mL OMNIPAQUE IOHEXOL 350 MG/ML SOLN COMPARISON:  Chest CT angiogram dated 03/24/2019. FINDINGS: Cardiovascular: Majority of the peripheral segmental and subsegmental pulmonary arteries cannot be evaluated due to extensive patient  breathing motion artifact. There is no pulmonary embolism identified within the main or central inter lobar pulmonary arteries bilaterally. Cardiomegaly. No pericardial effusion. No thoracic aortic aneurysm or evidence of aortic dissection. Mediastinum/Nodes: No mass or enlarged lymph nodes are seen within the mediastinum or perihilar regions. Esophagus is unremarkable. Trachea and central bronchi are unremarkable. Lungs/Pleura: Scattered ground-glass opacities within both lungs, superimposed on chronic w interstitial w lung disease/fibrosis. Upper Abdomen: No acute findings. Musculoskeletal: Degenerative spondylosis of the kyphotic thoracic spine, mild to moderate in degree. No acute-appearing osseous abnormality. Review of the MIP images confirms the above findings. IMPRESSION: 1. Patchy ground-glass opacities within both lungs. Differential includes atypical pneumonias such as viral or fungal, interstitial pneumonias, edema related to volume overload/CHF, chronic interstitial diseases, hypersensitivity pneumonitis, and respiratory bronchiolitis. COVID pneumonia can certainly have this appearance. 2. Evaluation for pulmonary embolism is significantly limited by patient breathing motion artifact. No pulmonary embolism is seen within the main or central interlobar pulmonary arteries. However, majority of the peripheral segmental and subsegmental pulmonary arteries cannot be evaluated due to the extensive patient breathing motion artifact. Small peripheral pulmonary embolism cannot be excluded. 3. Cardiomegaly. Electronically Signed   By: Franki Cabot M.D.   On: 06/03/2021 12:20    Procedures Procedures   Medications Ordered in ED Medications  HYDROcodone-acetaminophen (NORCO/VICODIN) 5-325 MG per tablet 1 tablet (has no administration in time range)  traMADol (ULTRAM) tablet 50 mg (has no administration in time range)  carvedilol (COREG) tablet 12.5 mg (has no administration in time range)  ezetimibe  (ZETIA) tablet 10 mg (has no administration in time range)  traZODone (DESYREL) tablet 50 mg (has no administration in time range)  zolpidem (AMBIEN) tablet 5 mg (has no administration in time range)  levothyroxine (SYNTHROID) tablet 50 mcg (has no administration in time range)  pantoprazole (PROTONIX) EC tablet 40 mg (has no administration in time range)  baclofen (LIORESAL) tablet 10 mg (has no administration in time range)  albuterol (VENTOLIN HFA) 108 (90 Base) MCG/ACT inhaler 2 puff (has no administration in time range)  loratadine (CLARITIN) tablet 10 mg (has no administration in time range)  fluticasone (FLONASE) 50 MCG/ACT nasal spray 2 spray (has no administration in time range)  montelukast (SINGULAIR) tablet 10 mg (has no administration in time range)  hydroxypropyl methylcellulose / hypromellose (ISOPTO TEARS / GONIOVISC) 2.5 % ophthalmic solution 1 drop (has no administration in time range)  latanoprost (XALATAN) 0.005 % ophthalmic solution 1 drop (has no administration in time range)  enoxaparin (LOVENOX) injection 40 mg (has no administration in time range)  cefTRIAXone (ROCEPHIN) 2 g in sodium chloride 0.9 % 100 mL IVPB (has no administration in time range)  azithromycin (ZITHROMAX) 500 mg in sodium chloride 0.9 % 250 mL IVPB (has no administration in time range)  acetaminophen (TYLENOL) tablet 650 mg (has no administration in time range)    Or  acetaminophen (TYLENOL) suppository 650 mg (has no administration in time range)  guaiFENesin (MUCINEX) 12 hr tablet 600 mg (has no administration in time range)  iohexol (OMNIPAQUE) 350 MG/ML injection 80 mL (80 mLs Intravenous Contrast Given 06/03/21 1154)  doxycycline (VIBRA-TABS) tablet 100 mg (100 mg Oral Given 06/03/21 1233)  cefTRIAXone (ROCEPHIN)  1 g in sodium chloride 0.9 % 100 mL IVPB (0 g Intravenous Stopped 06/03/21 1257)    ED Course  I have reviewed the triage vital signs and the nursing notes.  Pertinent labs &  imaging results that were available during my care of the patient were reviewed by me and considered in my medical decision making (see chart for details).  Ddx includes CHF exacerbation vs PE vs PNA vs anemia versus other.  The highest clinical concern to be for pulmonary embolism given that she is recently postoperative.  Her blood pressure stable and she does not appear to be in shock.  She is otherwise well-appearing on exam.  We will pursue a CT PE.  I have also ordered blood work.  I personally viewed and interpreted her imaging and her blood work.  Noted on her CT scan, although it is motion recreated.  She does have signs of possible atypical pneumonia or viral infection.  I do think it be reasonable to treat her for pneumonia with antibiotics at this time, will start IV Rocephin as well as p.o. doxycycline.  She does not appear septic.  Lower suspicion for PE at this time and will hold off on IV heparin unless PE could be confirmed by alternative methods, such as V/Q scan. I think more likely she is dealing with an infection in her lungs. Her BNP is within normal limits. She does not have any other clinical signs of congestive heart failure  I will defer decision for echocardiogram to the inpatient team  No acute anemia on her labs.    Patient reports that she was positive for COVID September 15 when she was routinely getting screened for her knee replacement surgery, though she was asymptomatic at that time.  Therefore it is possible she will remain positive at this time.  Clinical Course as of 06/03/21 1441  Sat Jun 03, 2021  1224 IMPRESSION: 1. Patchy ground-glass opacities within both lungs. Differential includes atypical pneumonias such as viral or fungal, interstitial pneumonias, edema related to volume overload/CHF, chronic interstitial diseases, hypersensitivity pneumonitis, and respiratory bronchiolitis. COVID pneumonia can certainly have this appearance. 2. Evaluation for  pulmonary embolism is significantly limited by patient breathing motion artifact. No pulmonary embolism is seen within the main or central interlobar pulmonary arteries. However, majority of the peripheral segmental and subsegmental pulmonary arteries cannot be evaluated due to the extensive patient breathing motion artifact. Small peripheral pulmonary embolism cannot be excluded. 3. Cardiomegaly. [MT]  1224 We will add on a COVID swab - think it's reasonable to start PNA antibiotics.  Pt remains hypoxic with movement down to 85%; anticipate admission  [MT]  1238 Patient updated regarding plans for admission and in agreement with this. [MT]  1302 Admitted to hospitalist who requested covid admission labs and DVT ultrasound.  Hospitalist is aware she was positive for covid one month ago [MT]    Clinical Course User Index [MT] Jago Carton, Carola Rhine, MD     Final Clinical Impression(s) / ED Diagnoses Final diagnoses:  Shortness of breath  Pneumonia due to infectious organism, unspecified laterality, unspecified part of lung  Hypoxemia    Rx / DC Orders ED Discharge Orders     None        Wyvonnia Dusky, MD 06/03/21 1441

## 2021-06-03 NOTE — ED Notes (Signed)
Called lab regarding unresulted BNP.

## 2021-06-03 NOTE — H&P (Signed)
History and Physical    [name] ZOX:[id number] DOB: [birth year] DOA: 06/03/2021  PCP: Camillia Herter, NP  Patient coming from: Home  Chief Complaint: dyspnea on exertion  HPI: [name] is a 70 y.o. female with medical history significant of HFrEF, bronchiectasis, HTN, HLD, OSA. Presenting with DOE. Symptoms started 3 days ago. She noticed that it was becoming more difficult for her to catch her breath when going to the bathroom. She was using her inhaler up to 5 times a day and taking her allergy medicines, but neither were helping. Her symptoms worsened to the point that she did not want to move. She had dry cough as well. This morning when her symptoms did not improve, she decided to come to the ED for help.   Of note, she had a right knee replacement 2 weeks ago. She was on ASA. She also reports a positive COVID Dx from early September. She completed a full course of paxlovid.  ED Course: She was hypoxic to 85% on RA. Her d-dimer was elevated at 7.17. Her CTA did not reveal PE, but did show multifocal PNA c/w viral PNA. Her COVID was negative. She was started on rocephin and doxy. [facility] was called for admission.   Review of Systems:  Denies CP, palpitations, N/V/D, fever, edema, sick contacts. Review of systems is otherwise negative for all not mentioned in HPI.   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   Bronchiectasis ([place])    Cerebral aneurysm, nonruptured    had a clip put in   CHF (congestive heart failure) (HCC)    Clostridium difficile infection    Depressive disorder, not elsewhere classified    Diverticulosis of colon (without mention of hemorrhage)    Esophageal reflux    Fatty liver    Fibromyalgia    Gastritis    GERD (gastroesophageal reflux disease)    GI bleed 2004    Glaucoma    Hiatal hernia    History of COVID-19 05/04/2021   Hyperlipidemia    Hypertension    Hypothyroidism    Internal hemorrhoids    Joint pain    Multifocal pneumonia 06/03/2021   Obstructive sleep apnea (adult) (pediatric)    Osteoarthritis    Ostium secundum type atrial septal defect    Other chronic nonalcoholic liver disease    Other pulmonary embolism and infarction    Palpitations    Paroxysmal ventricular tachycardia    Personal history of radiation therapy    PONV (postoperative nausea and vomiting)    Presence of permanent cardiac pacemaker    PUD (peptic ulcer disease)    Radiation 02/03/15-03/10/15   Right Breast   Sarcoid    per pt , not sure   Schatzki's ring    Shortness of breath    Sleep apnea    Stroke [facility]) 2013   tia/ pt feels it was around 2008 0r 2009   Takotsubo syndrome    Tubular adenoma of colon    Unspecified transient cerebral ischemia    Unspecified vitamin D deficiency     PSHx Past Surgical History:  Procedure Laterality Date   ABI  2006   normal   BREAST EXCISIONAL BIOPSY     BREAST LUMPECTOMY WITH RADIOACTIVE SEED LOCALIZATION Right 01/03/2015   Procedure:  BREAST LUMPECTOMY WITH RADIOACTIVE SEED LOCALIZATION;  Surgeon: Autumn Messing III, MD;  Location: [facility];  Service: General;  Laterality: Right;   CARDIAC DEFIBRILLATOR PLACEMENT  2006; 2012   BSX single chamber ICD   carotid dopplers  2006   neg   CEREBRAL ANEURYSM REPAIR  02/1999   COLONOSCOPY     HAMMER TOE SURGERY  11/15/2020   3rd digit bilateral feet    hospitalization  2004   GI bleed, PUD, diverticulosis (EGD,colonscopy)   hospitalization     PE, NSVT, s/p defib   KNEE ARTHROSCOPY     bilateral   LEFT HEART CATHETERIZATION WITH CORONARY ANGIOGRAM N/A 02/22/2012   Procedure: LEFT HEART CATHETERIZATION WITH CORONARY ANGIOGRAM;  Surgeon: Burnell Blanks, MD;  Location: [facility] CATH LAB;  Service: Cardiovascular;  Laterality: N/A;   PARTIAL HYSTERECTOMY     Fibroids    TONGUE BIOPSY  12/12/2017   due to sore tongue and white patches/abnormal cells   TOTAL KNEE ARTHROPLASTY Left 02/13/2016   Procedure: TOTAL KNEE ARTHROPLASTY;  Surgeon: Vickey Huger, MD;  Location: [place];  Service: Orthopedics;  Laterality: Left;   TOTAL KNEE ARTHROPLASTY Right 05/22/2021   Procedure: TOTAL KNEE ARTHROPLASTY;  Surgeon: Vickey Huger, MD;  Location: [facility];  Service: Orthopedics;  Laterality: Right;  with block   UPPER GASTROINTESTINAL ENDOSCOPY      SocHx  reports that she quit smoking about 31 years ago. Her smoking use included cigarettes. She has a 15.00 pack-year smoking history. She has never used smokeless tobacco. She reports current alcohol use. She reports that she does not currently use drugs after having used the following drugs: Marijuana.  Allergies  Allergen Reactions   Ace Inhibitors Swelling    Angioedema; makes tongue "break out"    Amitriptyline Hcl Other (See Comments)     makes her too sleepy!   Atorvastatin Other (See Comments)    SEVERE MYALGIA   Cymbalta [Duloxetine Hcl] Nausea Only and Other (See Comments)    Sleepiness/ sick   Dilantin [Phenytoin Sodium Extended] Rash    Severe rash   Paroxetine Nausea Only    Rapid heartbeat   Ramipril Other (See Comments)    TONGUE ULCERS    Rosuvastatin Other (See Comments)    SEVERE MYALGIA   Carbamazepine Rash   Codeine Itching   Paxlovid [Nirmatrelvir-Ritonavir] Itching   Phenytoin Sodium Extended Rash   Simvastatin Other (See Comments)    Increase in CK, myalgias   Wellbutrin [Bupropion] Palpitations    FamHx Family History  Problem Relation Age of Onset   Diabetes Mother    Alzheimer's disease Mother    Hypertension Mother    Obesity Mother    Other Brother        Thyroid problem 10/2016   Allergies Sister    Heart disease Sister    Heart disease Brother    Prostate cancer Brother 26       same brother as throat cancer   Throat cancer Brother        dx in his 82s; also a smoker    Heart disease Father    Cancer Maternal Grandmother 63       colon cancer or abdominal cancer   Lung cancer Maternal Grandfather 78   Breast cancer Maternal Aunt 55   Colon cancer Maternal Aunt 61       same sister as breast at 47   Lung cancer Maternal Uncle    Breast cancer Maternal Aunt  dx in her 68s   Pancreatic cancer Cousin 79       maternal first cousin   Breast cancer Cousin        paternal first cousin twice removed died in her 64s   Heart disease Son        Cardiac Arrest 07/2016   Stroke Neg Hx     Prior to Admission medications   Medication Sig Start Date End Date Taking? Authorizing Provider  acetaminophen (TYLENOL) 500 MG tablet Take 500 mg by mouth every 6 (six) hours as needed for moderate pain.   Yes [provider]  albuterol (PROVENTIL) (2.5 MG/3ML) 0.083% nebulizer solution USE 1 VIAL IN NEBULIZER EVERY 6 HOURS Patient taking differently: Take 2.5 mg by nebulization every 6 (six) hours as needed for shortness of breath. 10/11/20  Yes Chesley Mires, MD  albuterol (VENTOLIN HFA) 108 (90 Base) MCG/ACT inhaler Inhale 2 puffs into the lungs every 6 (six) hours as needed for wheezing or shortness of breath. 04/04/21  Yes Chesley Mires, MD  Ascorbic Acid (VITAMIN C) 1000 MG tablet Take 1,000 mg by mouth daily.   Yes [provider]  aspirin EC 325 MG EC tablet Take 1 tablet (325 mg total) by mouth 2 (two) times daily. 05/23/21  Yes Donia Ast, [place]  azithromycin [facility]) 250 MG tablet Zpack taper as directed 06/02/21  Yes Martyn Ehrich, NP  baclofen (LIORESAL) 10 MG tablet TAKE 1 TABLET BY MOUTH AT BEDTIME AS NEEDED FOR MUSCLE SPASM Patient taking differently: Take 10 mg by mouth at bedtime as needed for muscle spasms. 01/05/21  Yes Deveshwar, Abel Presto, MD  calcium carbonate (OS-CAL - DOSED IN MG OF ELEMENTAL CALCIUM) 1250 (500 Ca) MG tablet Take 1 tablet by mouth daily.   Yes [provider]  carvedilol (COREG) 12.5 MG tablet Take  1 tablet (12.5 mg total) by mouth 2 (two) times daily. 12/21/20  Yes Baldwin Jamaica, PA-C  cetirizine (ZYRTEC) 10 MG tablet Take 10 mg by mouth daily as needed for allergies.   Yes [provider]  Cholecalciferol (DIALYVITE VITAMIN D 5000) 125 MCG (5000 UT) capsule Take 5,000 Units by mouth daily.   Yes [provider]  diclofenac Sodium (VOLTAREN) 1 % GEL Apply 2-4 grams to affected joint 4 times daily as needed. Patient taking differently: Apply 2-4 g topically 4 (four) times daily as needed (joint pain). 11/15/20  Yes Ofilia Neas, PA-C  fexofenadine (ALLEGRA) 180 MG tablet Take 180 mg by mouth daily as needed for allergies (alternates with zyrtec sometimes).   Yes [provider]  fluticasone (FLONASE) 50 MCG/ACT nasal spray Place 2 sprays into both nostrils daily as needed for allergies or rhinitis. Patient taking differently: Place 2 sprays into both nostrils daily. 12/27/20  Yes Inda Coke, PA  furosemide (LASIX) 40 MG tablet Take 20-40 mg by mouth as needed for edema.   Yes [provider]  HYDROcodone-acetaminophen (NORCO/VICODIN) 5-325 MG tablet Take 1 tablet by mouth every 6 (six) hours as needed for pain. 06/01/21  Yes [provider]  hydroxypropyl methylcellulose / hypromellose (ISOPTO TEARS / GONIOVISC) 2.5 % ophthalmic solution Place 1 drop into both eyes at bedtime.   Yes [provider]  latanoprost (XALATAN) 0.005 % ophthalmic solution Place 1 drop into both eyes at bedtime.   Yes [provider]  levothyroxine (SYNTHROID) 50 MCG tablet Take 1 tablet (50 mcg total) by mouth daily. 03/23/21  Yes Briscoe Deutscher, DO  magnesium oxide (MAG-OX)  400 MG tablet Take 1 tablet (400 mg total) by mouth in the morning, at noon, and at bedtime. Patient taking differently: Take 400 mg by mouth 2 (two) times daily. 10/03/20  Yes Lovorn, Jinny Blossom, MD  methocarbamol (ROBAXIN) 500 MG tablet Take 1-2 tablets (500-1,000 mg total) by mouth  every 6 (six) hours as needed for muscle spasms. 05/23/21  Yes Donia Ast, PA  mometasone (ELOCON) 0.1 % lotion Apply 1 application topically daily as needed (rash). 04/27/21  Yes Minette Brine, Amy J, NP  montelukast (SINGULAIR) 10 MG tablet Take 1 tablet (10 mg total) by mouth at bedtime. 04/04/21  Yes Chesley Mires, MD  Multiple Vitamins-Minerals (MULTIVITAMIN PO) Take 1 tablet by mouth daily.   Yes [provider]  nitroGLYCERIN (NITROSTAT) 0.4 MG SL tablet DISSOLVE ONE TABLET UNDER THE TONGUE EVERY 5 MINUTES AS NEEDED FOR CHEST PAIN.  DO NOT EXCEED A TOTAL OF 3 DOSES IN 15 MINUTES Patient taking differently: Place 0.4 mg under the tongue every 5 (five) minutes as needed for chest pain. 05/04/21  Yes Bhagat, Bhavinkumar, PA  pantoprazole (PROTONIX) 40 MG tablet Take 1 tablet (40 mg total) by mouth daily. Patient taking differently: Take 40 mg by mouth See admin instructions. Take 40 mg daily, may take a second 40 mg dose as needed for acid reflux 02/01/21  Yes Pyrtle, Lajuan Lines, MD  phenol (CHLORASEPTIC) 1.4 % LIQD Use as directed 1 spray in the mouth or throat 3 (three) times daily as needed for throat irritation / pain. 05/11/21  Yes Jaynee Eagles, PA-C  polyethylene glycol powder (GLYCOLAX/MIRALAX) powder Take 17 grams by mouth daily 02/27/16  Yes Pyrtle, Lajuan Lines, MD  potassium chloride (KLOR-CON) 10 MEQ tablet TAKE 1/2 TABLET EVERY DAY Patient taking differently: Take 5 mEq by mouth daily as needed (when taking lasix). 04/18/21  Yes Jettie Booze, MD  sodium chloride HYPERTONIC 3 % nebulizer solution Take by nebulization 2 (two) times daily as needed for cough. 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 08/05/19  Yes Chesley Mires, MD  traMADol (ULTRAM) 50 MG tablet Take 1 tablet (50 mg total) by mouth 2 (two) times daily. Patient taking differently: Take 50 mg by mouth 2 (two) times daily as needed for moderate pain. 03/29/21  Yes Lovorn, Jinny Blossom, MD  traZODone (DESYREL) 50 MG tablet Take 0.5-1 tablets (25-50 mg  total) by mouth at bedtime as needed for sleep. Patient taking differently: Take 50 mg by mouth at bedtime as needed for sleep. 04/20/21  Yes Briscoe Deutscher, DO  zolpidem (AMBIEN) 5 MG tablet Take 1 tablet (5 mg total) by mouth at bedtime as needed for sleep. Patient taking differently: Take 5 mg by mouth at bedtime. 03/21/21  Yes Vivi Barrack, MD  ezetimibe (ZETIA) 10 MG tablet Take 1 tablet (10 mg total) by mouth daily. 05/03/21   Jettie Booze, MD  oxyCODONE (OXY IR/ROXICODONE) 5 MG immediate release tablet Take 1 tablet (5 mg total) by mouth every 4 (four) hours as needed for moderate pain (pain score 4-6). Max 6 pills in 24 hrs Patient not taking: Reported on 06/03/2021 05/23/21   Donia Ast, PA  predniSONE (DELTASONE) 10 MG tablet Take 4 tabs po daily x 2 days; then 2 tabs for 2 days; then 1 tab for 2 days 06/02/21   Martyn Ehrich, NP  Respiratory Therapy Supplies (FLUTTER) DEVI 1 Device by Does not apply route as needed. 09/22/18   Fenton Foy, NP    Physical Exam: Vitals:   06/03/21  1115 06/03/21 1215 06/03/21 1230 06/03/21 1300  BP: (!) 177/77 (!) 159/76 (!) 156/76 (!) 147/80  Pulse: 79 81 81 81  Resp: (!) 23 20 (!) 26 17  Temp:      TempSrc:      SpO2: 95% 92% 91% 96%    General: 70 y.o. female resting in bed in NAD Eyes: PERRL, normal sclera ENMT: Nares patent w/o discharge, orophaynx clear, dentition normal, ears w/o discharge/lesions/ulcers Neck: Supple, trachea midline Cardiovascular: RRR, +S1, S2, no m/g/r, equal pulses throughout Respiratory: soft crackles at left base, no w/r, slightly increased WOB on 2L [place] GI: BS+, NDNT, no masses noted, no organomegaly noted MSK: No e/c/c; RLE in compression stocking Neuro: A&O x 3, no focal deficits Psyc: Appropriate interaction and affect, calm/cooperative  Labs on Admission: I have personally reviewed following labs and imaging studies  CBC: Recent Labs  Lab 06/03/21 0927  WBC 10.4  NEUTROABS 8.4*   HGB 10.8*  HCT 35.2*  MCV 95.9  PLT 308*   Basic Metabolic Panel: Recent Labs  Lab 06/03/21 0927  NA PENDING  K PENDING  CL PENDING  CO2 PENDING  GLUCOSE PENDING  BUN PENDING  CREATININE PENDING  CALCIUM PENDING   GFR: CrCl cannot be calculated (This lab value cannot be used to calculate CrCl because it is not a number: PENDING). Liver Function Tests: No results for input(s): AST, ALT, ALKPHOS, BILITOT, PROT, ALBUMIN in the last 168 hours. No results for input(s): LIPASE, AMYLASE in the last 168 hours. No results for input(s): AMMONIA in the last 168 hours. Coagulation Profile: No results for input(s): INR, PROTIME in the last 168 hours. Cardiac Enzymes: No results for input(s): CKTOTAL, CKMB, CKMBINDEX, TROPONINI in the last 168 hours. BNP (last 3 results) No results for input(s): PROBNP in the last 8760 hours. HbA1C: No results for input(s): HGBA1C in the last 72 hours. CBG: No results for input(s): GLUCAP in the last 168 hours. Lipid Profile: No results for input(s): CHOL, HDL, LDLCALC, TRIG, CHOLHDL, LDLDIRECT in the last 72 hours. Thyroid Function Tests: No results for input(s): TSH, T4TOTAL, FREET4, T3FREE, THYROIDAB in the last 72 hours. Anemia Panel: No results for input(s): VITAMINB12, FOLATE, FERRITIN, TIBC, IRON, RETICCTPCT in the last 72 hours. Urine analysis:    Component Value Date/Time   COLORURINE YELLOW 02/03/2016 1414   APPEARANCEUR Cloudy (A) 06/23/2020 1149   LABSPEC 1.021 02/03/2016 1414   PHURINE 6.0 02/03/2016 1414   GLUCOSEU Negative 06/23/2020 1149   HGBUR NEGATIVE 02/03/2016 1414   HGBUR trace-intact 03/29/2010 1457   BILIRUBINUR neg 04/10/2021 1154   BILIRUBINUR Negative 06/23/2020 1149   KETONESUR NEGATIVE 02/03/2016 1414   PROTEINUR Negative 04/10/2021 1154   PROTEINUR Trace 06/23/2020 1149   PROTEINUR NEGATIVE 02/03/2016 1414   UROBILINOGEN 0.2 04/10/2021 1154   UROBILINOGEN 0.2 03/29/2010 1457   NITRITE neg 04/10/2021 1154    NITRITE Negative 06/23/2020 1149   NITRITE NEGATIVE 02/03/2016 1414   LEUKOCYTESUR Negative 04/10/2021 1154   LEUKOCYTESUR Negative 06/23/2020 1149    Radiological Exams on Admission: CT Angio Chest PE W/Cm &/Or Wo Cm  Result Date: 06/03/2021 CLINICAL DATA:  Shortness of breath. EXAM: CT ANGIOGRAPHY CHEST WITH CONTRAST TECHNIQUE: Multidetector CT imaging of the chest was performed using the standard protocol during bolus administration of intravenous contrast. Multiplanar CT image reconstructions and MIPs were obtained to evaluate the vascular anatomy. CONTRAST:  36mL OMNIPAQUE IOHEXOL 350 MG/ML SOLN COMPARISON:  Chest CT angiogram dated 03/24/2019. FINDINGS: Cardiovascular: Majority of the peripheral segmental  and subsegmental pulmonary arteries cannot be evaluated due to extensive patient breathing motion artifact. There is no pulmonary embolism identified within the main or central inter lobar pulmonary arteries bilaterally. Cardiomegaly. No pericardial effusion. No thoracic aortic aneurysm or evidence of aortic dissection. Mediastinum/Nodes: No mass or enlarged lymph nodes are seen within the mediastinum or perihilar regions. Esophagus is unremarkable. Trachea and central bronchi are unremarkable. Lungs/Pleura: Scattered ground-glass opacities within both lungs, superimposed on chronic w interstitial w lung disease/fibrosis. Upper Abdomen: No acute findings. Musculoskeletal: Degenerative spondylosis of the kyphotic thoracic spine, mild to moderate in degree. No acute-appearing osseous abnormality. Review of the MIP images confirms the above findings. IMPRESSION: 1. Patchy ground-glass opacities within both lungs. Differential includes atypical pneumonias such as viral or fungal, interstitial pneumonias, edema related to volume overload/CHF, chronic interstitial diseases, hypersensitivity pneumonitis, and respiratory bronchiolitis. COVID pneumonia can certainly have this appearance. 2. Evaluation for  pulmonary embolism is significantly limited by patient breathing motion artifact. No pulmonary embolism is seen within the main or central interlobar pulmonary arteries. However, majority of the peripheral segmental and subsegmental pulmonary arteries cannot be evaluated due to the extensive patient breathing motion artifact. Small peripheral pulmonary embolism cannot be excluded. 3. Cardiomegaly. Electronically Signed   By: Franki Cabot M.D.   On: 06/03/2021 12:20    EKG: Independently reviewed. Sinus, no st elevation  Assessment/Plan Multifocal PNA Acute hypoxic respiratory failure secondary to above     - admit to inpt, tele     - rocephin, azithro, guaifenesin, inhalers, IS, flutter     - COVID/flu negative     - check urine legionella, strep; check sputum culture, MRSA     - wean O2 as able  Elevated D-dimer     - CTA does not identify a PE, but there is motion artifact     - check BLE dopplers  HTN Chronic HFrEF     - resume home regimen     - BNP negative     - no peripheral edema noted on exam  Hypothyroidism     - continue home synthroid  OSA on CPAP     - CPAP at night  Bronchiectasis     - continue home regimen  HLD     - continue home zetia  Fibromyalgia     - continue home regimen  DVT prophylaxis: lovenox  Code Status: FULL  Family Communication: None at bedside  Consults called: None   Status is: Inpatient  Remains inpatient appropriate because: severity of illness  Jonnie Finner DO [facility]  If 7PM-7AM, please contact night-coverage [email or web address]  06/03/2021, 1:05 PM

## 2021-06-03 NOTE — ED Notes (Signed)
Pt desats with exertion to 85% on RA

## 2021-06-03 NOTE — ED Provider Notes (Signed)
Emergency Medicine Provider Triage Evaluation Note  [name] , a 70 y.o. female  was evaluated in triage.  Pt complains of worsening SOB 2 days ago. She had recent right knee replacement surgery. Additionally, she mentions DOE, palpitations, cough, and nasal congestion.  Review of Systems  Positive: SOB, cough, nasal congestion Negative: Rhinorrhea, chest pain, lightheadedness  Physical Exam  BP (!) 145/76 (BP Location: Left Arm)   Pulse 85   Temp 97.7 F (36.5 C) (Oral)   Resp 18   LMP  (LMP Unknown)   SpO2 97%  Gen:   Awake, no distress   Resp:  Normal effort, no resp distress or tripoding MSK:   Moves extremities without difficulty  Other:   RRR Right leg in compression stocking.  Medical Decision Making  Medically screening exam initiated at 9:21 AM.  Appropriate orders placed.  Eduardo Wurth Wilcock-Thompson was informed that the remainder of the evaluation will be completed by another provider, this initial triage assessment does not replace that evaluation, and the importance of remaining in the ED until their evaluation is complete.  Patient now having an O2 requirement. Will order CTA and labs   Sherrell Puller, Hershal Coria 06/03/21 2010    Wyvonnia Dusky, MD 06/03/21 [phone]

## 2021-06-04 ENCOUNTER — Inpatient Hospital Stay (HOSPITAL_COMMUNITY): Payer: Medicare Other

## 2021-06-04 ENCOUNTER — Encounter (HOSPITAL_COMMUNITY): Payer: Self-pay | Admitting: Internal Medicine

## 2021-06-04 DIAGNOSIS — M7989 Other specified soft tissue disorders: Secondary | ICD-10-CM

## 2021-06-04 DIAGNOSIS — R0902 Hypoxemia: Secondary | ICD-10-CM

## 2021-06-04 DIAGNOSIS — J189 Pneumonia, unspecified organism: Secondary | ICD-10-CM | POA: Diagnosis not present

## 2021-06-04 LAB — CBC
HCT: 29.6 % — ABNORMAL LOW (ref 36.0–46.0)
Hemoglobin: 9.4 g/dL — ABNORMAL LOW (ref 12.0–15.0)
MCH: 29.6 pg (ref 26.0–34.0)
MCHC: 31.8 g/dL (ref 30.0–36.0)
MCV: 93.1 fL (ref 80.0–100.0)
Platelets: 415 10*3/uL — ABNORMAL HIGH (ref 150–400)
RBC: 3.18 MIL/uL — ABNORMAL LOW (ref 3.87–5.11)
RDW: 13.9 % (ref 11.5–15.5)
WBC: 8 10*3/uL (ref 4.0–10.5)
nRBC: 0.2 % (ref 0.0–0.2)

## 2021-06-04 LAB — RESPIRATORY PANEL BY PCR

## 2021-06-04 LAB — COMPREHENSIVE METABOLIC PANEL
ALT: 20 U/L (ref 0–44)
AST: 26 U/L (ref 15–41)
Albumin: 2.7 g/dL — ABNORMAL LOW (ref 3.5–5.0)
Alkaline Phosphatase: 111 U/L (ref 38–126)
Anion gap: 6 (ref 5–15)
BUN: 10 mg/dL (ref 8–23)
CO2: 26 mmol/L (ref 22–32)
Calcium: 9 mg/dL (ref 8.9–10.3)
Chloride: 104 mmol/L (ref 98–111)
Creatinine, Ser: 0.52 mg/dL (ref 0.44–1.00)
GFR, Estimated: >60 mL/min (ref >60)
Glucose, Bld: 109 mg/dL — ABNORMAL HIGH (ref 70–99)
Potassium: 3.5 mmol/L (ref 3.5–5.1)
Sodium: 136 mmol/L (ref 135–145)
Total Bilirubin: 0.8 mg/dL (ref 0.3–1.2)
Total Protein: 6.3 g/dL — ABNORMAL LOW (ref 6.5–8.1)

## 2021-06-04 LAB — STREP PNEUMONIAE URINARY ANTIGEN: Strep Pneumo Urinary Antigen: NEGATIVE

## 2021-06-04 MED ORDER — IPRATROPIUM-ALBUTEROL 0.5-2.5 (3) MG/3ML IN SOLN
3.0000 mL | Freq: Four times a day (QID) | RESPIRATORY_TRACT | Status: DC
  Administered 2021-06-04 (×3): 3 mL via RESPIRATORY_TRACT
  Filled 2021-06-04 (×3): qty 3

## 2021-06-04 MED ORDER — DOCUSATE SODIUM 100 MG PO CAPS
100.0000 mg | ORAL_CAPSULE | Freq: Every day | ORAL | Status: DC
  Administered 2021-06-04 – 2021-06-07 (×4): 100 mg via ORAL
  Filled 2021-06-04 (×4): qty 1

## 2021-06-04 MED ORDER — CHLORHEXIDINE GLUCONATE 0.12 % MT SOLN
15.0000 mL | Freq: Two times a day (BID) | OROMUCOSAL | Status: DC
  Administered 2021-06-04 – 2021-06-07 (×7): 15 mL via OROMUCOSAL
  Filled 2021-06-04 (×7): qty 15

## 2021-06-04 NOTE — Progress Notes (Signed)
Patient O2 removed, sats down to 87% at rest and 83% when moving to sit on side of the bed.  Quickly recovered back to 99% when placed back on 2L O2.  Will continue to monitor and attempt to wean patient off O2.

## 2021-06-04 NOTE — Progress Notes (Signed)
Bilateral lower extremity venous duplex has been completed. Preliminary results can be found in CV Proc through chart review.   06/04/21 9:44 AM Carlos Levering RVT

## 2021-06-04 NOTE — Progress Notes (Signed)
PROGRESS NOTE  [name] VCB:[id number] DOB: October 22, 1950 DOA: 06/03/2021 PCP: Camillia Herter, NP   LOS: 1 day   Brief Narrative / Interim history: 70 year old female with history of systolic CHF, bronchiectasis, hypertension, hyperlipidemia, OSA, recent right knee replacement comes into the hospital with dyspnea on exertion.  Symptoms started 3-4 days ago, he was becoming more more difficult for pressure breath when walking around.  She was using her inhalers without much relief and started to come to the hospital.  She also has been complaining of a dry cough.  She denies any fever or chills.  She was diagnosed with COVID about a month ago and completed treatment.  Subjective / 24h Interval events: She is feeling about the same this morning, not complaining too much or shortness of breath at rest but has not been up and walking.  She is on oxygen today.  Denies any chest pain  Assessment & Plan: Principal Problem Acute hypoxic respiratory failure due to multifocal pneumonia, viral pneumonia-CT angiogram was negative for PE but does show findings concerning for viral pneumonia.  She is COVID-negative -Was placed empirically on ceftriaxone azithromycin, continue for now while awaiting cultures, likely narrow to oral agent tomorrow -Wean to room air as able -RVP panel pending  Active Problems Elevated D-dimer-CT angiogram negative for PE.  Lower extremity Dopplers pending  Chronic systolic CHF-seen by cardiology as an outpatient, she has a history of nonischemic cardiomyopathy with an EF as low as 30% in the past but now recovered -Appears euvolemic on exam, she is on Lasix as needed at home but has not been taking it because her weight has been stable and she had no evidence of fluid overload.  Monitor while here  Hypothyroidism-continue home Synthroid  OSA-continue CPAP at night  Bronchiectasis-placed on scheduled DuoNebs today, continue other home  medications  Hyperlipidemia-continue home medications  Scheduled Meds:  carvedilol  12.5 mg Oral BID WC   chlorhexidine  15 mL Mouth Rinse BID   enoxaparin (LOVENOX) injection  40 mg Subcutaneous Q24H   ezetimibe  10 mg Oral Daily   fluticasone  2 spray Each Nare Daily   guaiFENesin  600 mg Oral BID   ipratropium-albuterol  3 mL Nebulization Q6H   latanoprost  1 drop Both Eyes QHS   levothyroxine  50 mcg Oral Q0600   loratadine  10 mg Oral Daily   montelukast  10 mg Oral QHS   pantoprazole  40 mg Oral Daily   polyvinyl alcohol  1 drop Both Eyes QHS   zolpidem  5 mg Oral QHS   Continuous Infusions:  azithromycin 500 mg (06/04/21 0005)   cefTRIAXone (ROCEPHIN)  IV     PRN Meds:.acetaminophen **OR** acetaminophen, albuterol, baclofen, HYDROcodone-acetaminophen, traMADol, traZODone  Diet Orders (From admission, onward)     Start     Ordered   06/03/21 1427  Diet Heart Room service appropriate? Yes; Fluid consistency: Thin  Diet effective now       Question Answer Comment  Room service appropriate? Yes   Fluid consistency: Thin      06/03/21 1426            DVT prophylaxis: enoxaparin (LOVENOX) injection 40 mg Start: 06/03/21 2200     Code Status: Full Code  Family Communication: No family at bedside  Status is: Inpatient  Remains inpatient appropriate because: Persistent hypoxia  Level of care: Telemetry  Consultants:  none  Procedures:  none  Microbiology  COVID, influenza--10/15 Blood cultures-no growth to  date, 10/15 MRSA-not detected Strep pneumo urinary antigen-negative  Antimicrobials: Ceftriaxone/azithromycin 10/15   Objective: Vitals:   06/03/21 2308 06/04/21 0225 06/04/21 0600 06/04/21 0833  BP:  (!) 149/73    Pulse:  92    Resp: 18 20    Temp:  98.4 F (36.9 C)    TempSrc:  Oral    SpO2:  98% 100% 91%  Weight:      Height:        Intake/Output Summary (Last 24 hours) at 06/04/2021 0951 Last data filed at 06/04/2021  9147 Gross per 24 hour  Intake 521.26 ml  Output 500 ml  Net 21.26 ml   Filed Weights   06/03/21 1830  Weight: 96.2 kg    Examination:  Constitutional: NAD Eyes: no scleral icterus ENMT: Mucous membranes are moist.  Neck: normal, supple Respiratory: Faint bibasilar rhonchi, no wheezing, no crackles.  Normal respiratory effort Cardiovascular: Regular rate and rhythm, no murmurs / rubs / gallops. No LE edema. Abdomen: non distended, no tenderness. Bowel sounds positive.  Musculoskeletal: no clubbing / cyanosis.  Skin: no rashes Neurologic: CN 2-12 grossly intact. Strength 5/5 in all 4.   Data Reviewed: I have independently reviewed following labs and imaging studies   CBC: Recent Labs  Lab 06/03/21 0927 06/04/21 0332  WBC 10.4 8.0  NEUTROABS 8.4*  --   HGB 10.8* 9.4*  HCT 35.2* 29.6*  MCV 95.9 93.1  PLT 483* 829*   Basic Metabolic Panel: Recent Labs  Lab 06/03/21 0927 06/04/21 0332  NA 134* 136  K 4.9 3.5  CL 102 104  CO2 23 26  GLUCOSE 134* 109*  BUN 12 10  CREATININE 0.86 0.52  CALCIUM 9.6 9.0   Liver Function Tests: Recent Labs  Lab 06/04/21 0332  AST 26  ALT 20  ALKPHOS 111  BILITOT 0.8  PROT 6.3*  ALBUMIN 2.7*   Coagulation Profile: No results for input(s): INR, PROTIME in the last 168 hours. HbA1C: No results for input(s): HGBA1C in the last 72 hours. CBG: No results for input(s): GLUCAP in the last 168 hours.  Recent Results (from the past 240 hour(s))  Resp Panel by RT-PCR (Flu A&B, Covid) Nasopharyngeal Swab     Status: None   Collection Time: 06/03/21 12:25 PM   Specimen: Nasopharyngeal Swab; Nasopharyngeal(NP) swabs in vial transport medium  Result Value Ref Range Status   SARS Coronavirus 2 by RT PCR NEGATIVE NEGATIVE Final    Comment: (NOTE) SARS-CoV-2 target nucleic acids are NOT DETECTED.  The SARS-CoV-2 RNA is generally detectable in upper respiratory specimens during the acute phase of infection. The lowest concentration  of SARS-CoV-2 viral copies this assay can detect is 138 copies/mL. A negative result does not preclude SARS-Cov-2 infection and should not be used as the sole basis for treatment or other patient management decisions. A negative result may occur with  improper specimen collection/handling, submission of specimen other than nasopharyngeal swab, presence of viral mutation(s) within the areas targeted by this assay, and inadequate number of viral copies(<138 copies/mL). A negative result must be combined with clinical observations, patient history, and epidemiological information. The expected result is Negative.  Fact Sheet for Patients:  EntrepreneurPulse.com.au  Fact Sheet for Healthcare Providers:  IncredibleEmployment.be  This test is no t yet approved or cleared by the [place] FDA and  has been authorized for detection and/or diagnosis of SARS-CoV-2 by FDA under an Emergency Use Authorization (EUA). This EUA will remain  in effect (meaning this test can  be used) for the duration of the COVID-19 declaration under Section 564(b)(1) of the Act, 21 U.S.C.section 360bbb-3(b)(1), unless the authorization is terminated  or revoked sooner.       Influenza A by PCR NEGATIVE NEGATIVE Final   Influenza B by PCR NEGATIVE NEGATIVE Final    Comment: (NOTE) The Xpert Xpress SARS-CoV-2/FLU/RSV plus assay is intended as an aid in the diagnosis of influenza from Nasopharyngeal swab specimens and should not be used as a sole basis for treatment. Nasal washings and aspirates are unacceptable for Xpert Xpress SARS-CoV-2/FLU/RSV testing.  Fact Sheet for Patients: EntrepreneurPulse.com.au  Fact Sheet for Healthcare Providers: IncredibleEmployment.be  This test is not yet approved or cleared by the [place] FDA and has been authorized for detection and/or diagnosis of SARS-CoV-2 by FDA under an Emergency Use  Authorization (EUA). This EUA will remain in effect (meaning this test can be used) for the duration of the COVID-19 declaration under Section 564(b)(1) of the Act, 21 U.S.C. section 360bbb-3(b)(1), unless the authorization is terminated or revoked.  Performed at [facility], [place] [address] [place], [place] [address]   Blood Culture (routine x 2)     Status: None (Preliminary result)   Collection Time: 06/03/21  1:00 PM   Specimen: BLOOD  Result Value Ref Range Status   Specimen Description   Final    BLOOD LEFT ANTECUBITAL Performed at [facility] [address] [place], [place] [address]    Special Requests   Final    BOTTLES DRAWN AEROBIC AND ANAEROBIC Blood Culture adequate volume Performed at [facility] [address], [place], [place] [address]    Culture   Final    NO GROWTH < 24 HOURS Performed at [facility] [address] [place], [place] [address]    Report Status PENDING  Incomplete  Blood Culture (routine x 2)     Status: None (Preliminary result)   Collection Time: 06/03/21  1:05 PM   Specimen: BLOOD  Result Value Ref Range Status   Specimen Description   Final    BLOOD BLOOD LEFT HAND Performed at [facility] [address] [place], [place] [address]    Special Requests   Final    BOTTLES DRAWN AEROBIC AND ANAEROBIC Blood Culture results may not be optimal due to an inadequate volume of blood received in culture bottles Performed at [facility] [address], [place], [place] [address]    Culture   Final    NO GROWTH < 24 HOURS Performed at [facility] [address] [place],  [address]    Report Status PENDING  Incomplete  MRSA Next Gen by PCR, Nasal     Status: None   Collection Time: 06/03/21  2:27 PM   Specimen: Nasal Mucosa; Nasal Swab  Result Value Ref Range Status   MRSA by PCR Next Gen NOT DETECTED NOT DETECTED  Final    Comment: (NOTE) The GeneXpert MRSA Assay (FDA approved for NASAL specimens only), is one component of a comprehensive MRSA colonization surveillance program. It is not intended to diagnose MRSA infection nor to guide or monitor treatment for MRSA infections. Test performance is not FDA approved in patients less than 39 years old. Performed at [facility] [place] [address], [place],  [address]      Radiology Studies: CT Angio Chest PE W/Cm &/Or Wo Cm  Result Date: 06/03/2021 CLINICAL DATA:  Shortness  of breath. EXAM: CT ANGIOGRAPHY CHEST WITH CONTRAST TECHNIQUE: Multidetector CT imaging of the chest was performed using the standard protocol during bolus administration of intravenous contrast. Multiplanar CT image reconstructions and MIPs were obtained to evaluate the vascular anatomy. CONTRAST:  58mL OMNIPAQUE IOHEXOL 350 MG/ML SOLN COMPARISON:  Chest CT angiogram dated 03/24/2019. FINDINGS: Cardiovascular: Majority of the peripheral segmental and subsegmental pulmonary arteries cannot be evaluated due to extensive patient breathing motion artifact. There is no pulmonary embolism identified within the main or central inter lobar pulmonary arteries bilaterally. Cardiomegaly. No pericardial effusion. No thoracic aortic aneurysm or evidence of aortic dissection. Mediastinum/Nodes: No mass or enlarged lymph nodes are seen within the mediastinum or perihilar regions. Esophagus is unremarkable. Trachea and central bronchi are unremarkable. Lungs/Pleura: Scattered ground-glass opacities within both lungs, superimposed on chronic w interstitial w lung disease/fibrosis. Upper Abdomen: No acute findings. Musculoskeletal: Degenerative spondylosis of the kyphotic thoracic spine, mild to moderate in degree. No acute-appearing osseous abnormality. Review of the MIP images confirms the above findings. IMPRESSION: 1. Patchy ground-glass opacities within both lungs. Differential  includes atypical pneumonias such as viral or fungal, interstitial pneumonias, edema related to volume overload/CHF, chronic interstitial diseases, hypersensitivity pneumonitis, and respiratory bronchiolitis. COVID pneumonia can certainly have this appearance. 2. Evaluation for pulmonary embolism is significantly limited by patient breathing motion artifact. No pulmonary embolism is seen within the main or central interlobar pulmonary arteries. However, majority of the peripheral segmental and subsegmental pulmonary arteries cannot be evaluated due to the extensive patient breathing motion artifact. Small peripheral pulmonary embolism cannot be excluded. 3. Cardiomegaly. Electronically Signed   By: Franki Cabot M.D.   On: 06/03/2021 12:20    Marzetta Board, MD, PhD [facility]  Between 7 am - 7 pm I am available, please contact me via Amion (for emergencies) or Securechat (non urgent messages)  Between 7 pm - 7 am I am not available, please contact night coverage MD/APP via Amion

## 2021-06-05 DIAGNOSIS — R0902 Hypoxemia: Secondary | ICD-10-CM | POA: Diagnosis not present

## 2021-06-05 DIAGNOSIS — J189 Pneumonia, unspecified organism: Secondary | ICD-10-CM | POA: Diagnosis not present

## 2021-06-05 LAB — COMPREHENSIVE METABOLIC PANEL
ALT: 19 U/L (ref 0–44)
AST: 25 U/L (ref 15–41)
Albumin: 2.6 g/dL — ABNORMAL LOW (ref 3.5–5.0)
Alkaline Phosphatase: 116 U/L (ref 38–126)
Anion gap: 8 (ref 5–15)
BUN: 8 mg/dL (ref 8–23)
CO2: 25 mmol/L (ref 22–32)
Calcium: 9.2 mg/dL (ref 8.9–10.3)
Chloride: 106 mmol/L (ref 98–111)
Creatinine, Ser: 0.59 mg/dL (ref 0.44–1.00)
GFR, Estimated: >60 mL/min (ref >60)
Glucose, Bld: 101 mg/dL — ABNORMAL HIGH (ref 70–99)
Potassium: 3.4 mmol/L — ABNORMAL LOW (ref 3.5–5.1)
Sodium: 139 mmol/L (ref 135–145)
Total Bilirubin: 0.6 mg/dL (ref 0.3–1.2)
Total Protein: 6.5 g/dL (ref 6.5–8.1)

## 2021-06-05 LAB — CBC
HCT: 31.2 % — ABNORMAL LOW (ref 36.0–46.0)
Hemoglobin: 9.5 g/dL — ABNORMAL LOW (ref 12.0–15.0)
MCH: 28.9 pg (ref 26.0–34.0)
MCHC: 30.4 g/dL (ref 30.0–36.0)
MCV: 94.8 fL (ref 80.0–100.0)
Platelets: 434 10*3/uL — ABNORMAL HIGH (ref 150–400)
RBC: 3.29 MIL/uL — ABNORMAL LOW (ref 3.87–5.11)
RDW: 14.1 % (ref 11.5–15.5)
WBC: 7.1 10*3/uL (ref 4.0–10.5)
nRBC: 0 % (ref 0.0–0.2)

## 2021-06-05 LAB — LEGIONELLA PNEUMOPHILA SEROGP 1 UR AG: L. pneumophila Serogp 1 Ur Ag: NEGATIVE

## 2021-06-05 MED ORDER — LIP MEDEX EX OINT
TOPICAL_OINTMENT | CUTANEOUS | Status: DC | PRN
  Administered 2021-06-05: 75 via TOPICAL
  Filled 2021-06-05: qty 7

## 2021-06-05 MED ORDER — POTASSIUM CHLORIDE CRYS ER 20 MEQ PO TBCR
40.0000 meq | EXTENDED_RELEASE_TABLET | Freq: Once | ORAL | Status: AC
  Administered 2021-06-05: 40 meq via ORAL
  Filled 2021-06-05: qty 2

## 2021-06-05 MED ORDER — FUROSEMIDE 10 MG/ML IJ SOLN
40.0000 mg | Freq: Once | INTRAMUSCULAR | Status: AC
  Administered 2021-06-05: 40 mg via INTRAVENOUS
  Filled 2021-06-05: qty 4

## 2021-06-05 MED ORDER — PREDNISONE 20 MG PO TABS
40.0000 mg | ORAL_TABLET | Freq: Every day | ORAL | Status: DC
  Administered 2021-06-05 – 2021-06-07 (×3): 40 mg via ORAL
  Filled 2021-06-05 (×3): qty 2

## 2021-06-05 MED ORDER — IPRATROPIUM-ALBUTEROL 0.5-2.5 (3) MG/3ML IN SOLN
3.0000 mL | Freq: Three times a day (TID) | RESPIRATORY_TRACT | Status: DC
  Administered 2021-06-05 – 2021-06-07 (×7): 3 mL via RESPIRATORY_TRACT
  Filled 2021-06-05 (×7): qty 3

## 2021-06-05 MED ORDER — BUDESONIDE 0.25 MG/2ML IN SUSP
0.2500 mg | Freq: Two times a day (BID) | RESPIRATORY_TRACT | Status: DC
  Administered 2021-06-05 – 2021-06-07 (×5): 0.25 mg via RESPIRATORY_TRACT
  Filled 2021-06-05 (×4): qty 2

## 2021-06-05 MED ORDER — DOXYCYCLINE HYCLATE 100 MG PO TABS
100.0000 mg | ORAL_TABLET | Freq: Two times a day (BID) | ORAL | Status: AC
  Administered 2021-06-05 – 2021-06-06 (×4): 100 mg via ORAL
  Filled 2021-06-05 (×4): qty 1

## 2021-06-05 NOTE — Evaluation (Signed)
Physical Therapy Evaluation Patient Details Name: [name] MRN: [id number] DOB: [birth year] Today's Date: 06/05/2021  History of Present Illness  70 year old female with history of systolic CHF, bronchiectasis, hypertension, hyperlipidemia, AICD, OSA, LTKA, and recent right knee replacement 10/322 and presented to hospital 06/03/21 for dyspnea on exertion.  Pt admitted for Acute hypoxic respiratory failure due to multifocal pneumonia, viral pneumonia, underlying bronchiectasis and pulmonary fibrosis  Clinical Impression  Pt admitted with above diagnosis.  Pt currently with functional limitations due to the deficits listed below (see PT Problem List). Pt will benefit from skilled PT to increase their independence and safety with mobility to allow discharge to the venue listed below.  Pt with recent R TKA and had just started with OPPT prior to this admission.  Pt requiring at least 3L O2 [place] for exertional tasks such as ambulation at this time.  Pt encouraged to continue performing LE exercises during acute stay.      Recommendations for follow up therapy are one component of a multi-disciplinary discharge planning process, led by the attending physician.  Recommendations may be updated based on patient status, additional functional criteria and insurance authorization.  Follow Up Recommendations Outpatient PT (continue OPPT for s/p R TKA)    Equipment Recommendations  None recommended by PT    Recommendations for Other Services       Precautions / Restrictions Precautions Precautions: Fall;Knee Precaution Comments: monitor sats Restrictions Weight Bearing Restrictions: No      Mobility  Bed Mobility               General bed mobility comments: pt in recliner on arrival    Transfers Overall transfer level: Needs assistance Equipment used: Rolling walker (2 wheeled) Transfers: Sit to/from Stand Sit to Stand: Min guard         General transfer comment:  min/guard for safety  Ambulation/Gait Ambulation/Gait assistance: Min guard Gait Distance (Feet): 180 Feet Assistive device: Rolling walker (2 wheeled) Gait Pattern/deviations: Step-through pattern;Decreased stride length     General Gait Details: pt felt reliant on UE support with RW, required 2 standing rest breaks for breathing, SpO2 dropped to 87% on 2L O2 [place] so increased to 3L and SPo2 92%  Stairs            Wheelchair Mobility    Modified Rankin (Stroke Patients Only)       Balance                                             Pertinent Vitals/Pain Pain Assessment: 0-10 Pain Score: 2  Pain Location: rigth knee Pain Descriptors / Indicators: Sore Pain Intervention(s): Repositioned;Monitored during session (reports she takes tylenol now for pain)    Home Living Family/patient expects to be discharged to:: Private residence Living Arrangements: Alone Available Help at Discharge: Family Type of Home: House (2 steps into living area) Home Access: Stairs to enter;Ramped entrance     Home Layout: One level Home Equipment: Environmental consultant - 2 wheels;Walker - 4 wheels;Bedside commode      Prior Function Level of Independence: Independent with assistive device(s)         Comments: was still using RW and had one visit with OPPT prior to this admission (recent R TKA 10/3)     Hand Dominance        Extremity/Trunk Assessment  Lower Extremity Assessment Lower Extremity Assessment: RLE deficits/detail RLE Deficits / Details: unable to perform SLR without lag, AAROM 2-65* right knee       Communication   Communication: No difficulties  Cognition Arousal/Alertness: Awake/alert Behavior During Therapy: WFL for tasks assessed/performed Overall Cognitive Status: Within Functional Limits for tasks assessed                                        General Comments      Exercises Total Joint Exercises Ankle Circles/Pumps:  AROM;Both;10 reps Quad Sets: AROM;Both;10 reps Heel Slides: Both;10 reps;AAROM Hip ABduction/ADduction: Right;5 reps;AAROM Straight Leg Raises: AAROM;Right;5 reps   Assessment/Plan    PT Assessment Patient needs continued PT services  PT Problem List Decreased strength;Decreased range of motion;Decreased activity tolerance;Decreased mobility;Decreased balance;Pain;Cardiopulmonary status limiting activity       PT Treatment Interventions DME instruction;Therapeutic activities;Gait training;Functional mobility training;Therapeutic exercise;Patient/family education    PT Goals (Current goals can be found in the Care Plan section)  Acute Rehab PT Goals PT Goal Formulation: With patient Time For Goal Achievement: 06/19/21 Potential to Achieve Goals: Good    Frequency Min 3X/week   Barriers to discharge        Co-evaluation               AM-PAC PT "6 Clicks" Mobility  Outcome Measure Help needed turning from your back to your side while in a flat bed without using bedrails?: A Little Help needed moving from lying on your back to sitting on the side of a flat bed without using bedrails?: A Little Help needed moving to and from a bed to a chair (including a wheelchair)?: A Little Help needed standing up from a chair using your arms (e.g., wheelchair or bedside chair)?: A Little Help needed to walk in hospital room?: A Little Help needed climbing 3-5 steps with a railing? : A Little 6 Click Score: 18    End of Session Equipment Utilized During Treatment: Gait belt;Oxygen Activity Tolerance: Patient tolerated treatment well Patient left: in chair;with call bell/phone within reach;with chair alarm set Nurse Communication: Mobility status PT Visit Diagnosis: Difficulty in walking, not elsewhere classified (R26.2)    Time: 5597-4163 PT Time Calculation (min) (ACUTE ONLY): 26 min   Charges:   PT Evaluation $PT Eval Low Complexity: 1 Low PT Treatments $Gait Training: 8-22  mins      Jannette Spanner PT, DPT Acute Rehabilitation Services Pager: [phone] Office: [place] 06/05/2021, 12:54 PM

## 2021-06-05 NOTE — Progress Notes (Signed)
PROGRESS NOTE  [name] BEM:[id number] DOB: 08-29-1950 DOA: 06/03/2021 PCP: Camillia Herter, NP   LOS: 2 days   Brief Narrative / Interim history: 70 year old female with history of systolic CHF, bronchiectasis, hypertension, hyperlipidemia, OSA, recent right knee replacement comes into the hospital with dyspnea on exertion.  Symptoms started 3-4 days ago, he was becoming more more difficult for pressure breath when walking around.  She was using her inhalers without much relief and started to come to the hospital.  She also has been complaining of a dry cough.  She denies any fever or chills.  She was diagnosed with COVID about a month ago and completed treatment.  Subjective / 24h Interval events: Denies any shortness of breath at rest, but got pretty winded yesterday when she was up and walking.  Assessment & Plan: Principal Problem Acute hypoxic respiratory failure due to multifocal pneumonia, viral pneumonia, underlying bronchiectasis and pulmonary fibrosis-on admission, due to elevated D-dimer she underwent a CT scan which was negative for PE but it did show patchy groundglass opacities in both lungs, potentially represented atypical pneumonia but also chronic interstitial disease/respiratory bronchiolitis.  Patient did not have any fever, chills, URI type symptoms prior to becoming short of breath this is less likely representing an acute process.  She had COVID about a month ago, and she has known pulmonary fibrosis and follows with Dr. Halford Chessman as an outpatient.  Case was discussed with Dr. Erin Fulling with pulmonology, reviewed scans and this appears to be an acute on chronic process possibly worsened by recent COVID infection last month.  Procalcitonin was undetectable, stop broad-spectrum IV antibiotics and will do doxycycline for 2 more days.  RVP was negative.  Placed on steroids today, will also do IV Lasix, monitor response.  She may need home oxygen  Active  Problems Elevated D-dimer-CT angiogram negative for PE.  Lower extremity Dopplers negative for acute DVT Chronic systolic CHF-seen by cardiology as an outpatient, she has a history of nonischemic cardiomyopathy with an EF as low as 30% in the past but now recovered.  No lower extremity edema but will do IV Lasix as above Hypothyroidism-continue home Synthroid OSA-continue CPAP at night Bronchiectasis-continue DuoNebs as needed, placed on inhaled steroids per pulmonology recommendations Hyperlipidemia-continue home medications  Scheduled Meds:  carvedilol  12.5 mg Oral BID WC   chlorhexidine  15 mL Mouth Rinse BID   docusate sodium  100 mg Oral Daily   doxycycline  100 mg Oral Q12H   enoxaparin (LOVENOX) injection  40 mg Subcutaneous Q24H   ezetimibe  10 mg Oral Daily   fluticasone  2 spray Each Nare Daily   guaiFENesin  600 mg Oral BID   ipratropium-albuterol  3 mL Nebulization TID   latanoprost  1 drop Both Eyes QHS   levothyroxine  50 mcg Oral Q0600   loratadine  10 mg Oral Daily   montelukast  10 mg Oral QHS   pantoprazole  40 mg Oral Daily   polyvinyl alcohol  1 drop Both Eyes QHS   predniSONE  40 mg Oral Q breakfast   zolpidem  5 mg Oral QHS   Continuous Infusions:   PRN Meds:.acetaminophen **OR** acetaminophen, albuterol, baclofen, HYDROcodone-acetaminophen, traMADol, traZODone  Diet Orders (From admission, onward)     Start     Ordered   06/03/21 1427  Diet Heart Room service appropriate? Yes; Fluid consistency: Thin  Diet effective now       Question Answer Comment  Room service appropriate? Yes  Fluid consistency: Thin      06/03/21 1426            DVT prophylaxis: enoxaparin (LOVENOX) injection 40 mg Start: 06/03/21 2200     Code Status: Full Code  Family Communication: No family at bedside  Status is: Inpatient  Remains inpatient appropriate because: Persistent hypoxia  Level of care: Telemetry  Consultants:  none  Procedures:   none  Microbiology  COVID, influenza--10/15 Blood cultures-no growth to date, 10/15 MRSA-not detected Strep pneumo urinary antigen-negative  Antimicrobials: Ceftriaxone/azithromycin 10/15 >> 10/17 Doxycycline 10/17 >> 10/19   Objective: Vitals:   06/05/21 0140 06/05/21 0538 06/05/21 0700 06/05/21 1144  BP:  (!) 144/76  135/76  Pulse:  79  88  Resp:    18  Temp:  98.2 F (36.8 C)  97.8 F (36.6 C)  TempSrc:  Oral  Oral  SpO2: 97% 99% 93% 97%  Weight:      Height:        Intake/Output Summary (Last 24 hours) at 06/05/2021 1212 Last data filed at 06/05/2021 1040 Gross per 24 hour  Intake 1090.04 ml  Output 1550 ml  Net -459.96 ml    Filed Weights   06/03/21 1830  Weight: 96.2 kg    Examination:  Constitutional: She is in no distress, in bed Eyes: No scleral icterus ENMT: Moist membranes Neck: normal, supple Respiratory: Bibasilar rhonchi present, no wheezing, faint crackles.  Normal respiratory effort Cardiovascular: Regular rate and rhythm, no murmurs, no peripheral edema Abdomen: Soft, NT, ND, bowel sounds positive Musculoskeletal: no clubbing / cyanosis.  Skin: No rashes seen Neurologic: No focal deficits  Data Reviewed: I have independently reviewed following labs and imaging studies   CBC: Recent Labs  Lab 06/03/21 0927 06/04/21 0332 06/05/21 0420  WBC 10.4 8.0 7.1  NEUTROABS 8.4*  --   --   HGB 10.8* 9.4* 9.5*  HCT 35.2* 29.6* 31.2*  MCV 95.9 93.1 94.8  PLT 483* 415* 434*    Basic Metabolic Panel: Recent Labs  Lab 06/03/21 0927 06/04/21 0332 06/05/21 0420  NA 134* 136 139  K 4.9 3.5 3.4*  CL 102 104 106  CO2 23 26 25   GLUCOSE 134* 109* 101*  BUN 12 10 8   CREATININE 0.86 0.52 0.59  CALCIUM 9.6 9.0 9.2    Liver Function Tests: Recent Labs  Lab 06/04/21 0332 06/05/21 0420  AST 26 25  ALT 20 19  ALKPHOS 111 116  BILITOT 0.8 0.6  PROT 6.3* 6.5  ALBUMIN 2.7* 2.6*    Coagulation Profile: No results for input(s): INR,  PROTIME in the last 168 hours. HbA1C: No results for input(s): HGBA1C in the last 72 hours. CBG: No results for input(s): GLUCAP in the last 168 hours.  Recent Results (from the past 240 hour(s))  Resp Panel by RT-PCR (Flu A&B, Covid) Nasopharyngeal Swab     Status: None   Collection Time: 06/03/21 12:25 PM   Specimen: Nasopharyngeal Swab; Nasopharyngeal(NP) swabs in vial transport medium  Result Value Ref Range Status   SARS Coronavirus 2 by RT PCR NEGATIVE NEGATIVE Final    Comment: (NOTE) SARS-CoV-2 target nucleic acids are NOT DETECTED.  The SARS-CoV-2 RNA is generally detectable in upper respiratory specimens during the acute phase of infection. The lowest concentration of SARS-CoV-2 viral copies this assay can detect is 138 copies/mL. A negative result does not preclude SARS-Cov-2 infection and should not be used as the sole basis for treatment or other patient management decisions. A negative result  may occur with  improper specimen collection/handling, submission of specimen other than nasopharyngeal swab, presence of viral mutation(s) within the areas targeted by this assay, and inadequate number of viral copies(<138 copies/mL). A negative result must be combined with clinical observations, patient history, and epidemiological information. The expected result is Negative.  Fact Sheet for Patients:  EntrepreneurPulse.com.au  Fact Sheet for Healthcare Providers:  IncredibleEmployment.be  This test is no t yet approved or cleared by the [place] FDA and  has been authorized for detection and/or diagnosis of SARS-CoV-2 by FDA under an Emergency Use Authorization (EUA). This EUA will remain  in effect (meaning this test can be used) for the duration of the COVID-19 declaration under Section 564(b)(1) of the Act, 21 U.S.C.section 360bbb-3(b)(1), unless the authorization is terminated  or revoked sooner.       Influenza A by PCR  NEGATIVE NEGATIVE Final   Influenza B by PCR NEGATIVE NEGATIVE Final    Comment: (NOTE) The Xpert Xpress SARS-CoV-2/FLU/RSV plus assay is intended as an aid in the diagnosis of influenza from Nasopharyngeal swab specimens and should not be used as a sole basis for treatment. Nasal washings and aspirates are unacceptable for Xpert Xpress SARS-CoV-2/FLU/RSV testing.  Fact Sheet for Patients: EntrepreneurPulse.com.au  Fact Sheet for Healthcare Providers: IncredibleEmployment.be  This test is not yet approved or cleared by the [place] FDA and has been authorized for detection and/or diagnosis of SARS-CoV-2 by FDA under an Emergency Use Authorization (EUA). This EUA will remain in effect (meaning this test can be used) for the duration of the COVID-19 declaration under Section 564(b)(1) of the Act, 21 U.S.C. section 360bbb-3(b)(1), unless the authorization is terminated or revoked.  Performed at [facility], [place] [address] [place], [place] [address]   Blood Culture (routine x 2)     Status: None (Preliminary result)   Collection Time: 06/03/21  1:00 PM   Specimen: BLOOD  Result Value Ref Range Status   Specimen Description   Final    BLOOD LEFT ANTECUBITAL Performed at [facility] [address], [place], [place] [address]    Special Requests   Final    BOTTLES DRAWN AEROBIC AND ANAEROBIC Blood Culture adequate volume Performed at [facility] [address] [place], [place] [address]    Culture   Final    NO GROWTH 2 DAYS Performed at [facility] [address] [place], [place] [address]    Report Status PENDING  Incomplete  Blood Culture (routine x 2)     Status: None (Preliminary result)   Collection Time: 06/03/21  1:05 PM   Specimen: BLOOD  Result Value Ref Range Status   Specimen Description   Final    BLOOD BLOOD LEFT HAND Performed at [facility] [address] [place], [place] [address]    Special Requests   Final    BOTTLES DRAWN AEROBIC AND ANAEROBIC Blood Culture results may not be optimal due to an inadequate volume of blood received in culture bottles Performed at [facility] [address], [place], [place] [address]    Culture   Final    NO GROWTH 2 DAYS Performed at [facility] [address], [place], [place] [address]    Report Status PENDING  Incomplete  MRSA Next Gen by PCR, Nasal     Status: None   Collection Time: 06/03/21  2:27 PM   Specimen: Nasal Mucosa; Nasal Swab  Result Value Ref Range Status   MRSA by PCR Next Gen NOT DETECTED NOT DETECTED Final    Comment: (NOTE) The GeneXpert MRSA Assay (FDA approved for NASAL specimens only), is one component of a comprehensive MRSA colonization surveillance program. It is not intended to diagnose MRSA infection nor to guide or monitor treatment for MRSA infections. Test performance is not FDA approved in patients less than 72 years old. Performed at [facility], [place] [address] [place], [place] [address]   Respiratory (~20 pathogens) panel by PCR     Status: None   Collection Time: 06/04/21  6:20 AM   Specimen: Nasopharyngeal Swab; Respiratory  Result Value Ref Range Status   Adenovirus NOT DETECTED NOT DETECTED Final   Coronavirus 229E NOT DETECTED NOT DETECTED Final    Comment: (NOTE) The Coronavirus on the Respiratory Panel, DOES NOT test for the novel  Coronavirus (2019 nCoV)    Coronavirus HKU1 NOT DETECTED NOT DETECTED Final   Coronavirus NL63 NOT DETECTED NOT DETECTED Final   Coronavirus OC43 NOT DETECTED NOT DETECTED Final   Metapneumovirus NOT DETECTED NOT DETECTED Final   Rhinovirus / Enterovirus NOT DETECTED NOT DETECTED Final   Influenza A NOT DETECTED NOT DETECTED Final   Influenza B NOT DETECTED NOT DETECTED Final   Parainfluenza Virus 1 NOT DETECTED NOT DETECTED Final    Parainfluenza Virus 2 NOT DETECTED NOT DETECTED Final   Parainfluenza Virus 3 NOT DETECTED NOT DETECTED Final   Parainfluenza Virus 4 NOT DETECTED NOT DETECTED Final   Respiratory Syncytial Virus NOT DETECTED NOT DETECTED Final   Bordetella pertussis NOT DETECTED NOT DETECTED Final   Bordetella Parapertussis NOT DETECTED NOT DETECTED Final   Chlamydophila pneumoniae NOT DETECTED NOT DETECTED Final   Mycoplasma pneumoniae NOT DETECTED NOT DETECTED Final    Comment: Performed at [facility], [place]. [address] [place], [place] [address]      Radiology Studies: No results found.  Marzetta Board, MD, PhD [facility]  Between 7 am - 7 pm I am available, please contact me via Amion (for emergencies) or Securechat (non urgent messages)  Between 7 pm - 7 am I am not available, please contact night coverage MD/APP via Amion

## 2021-06-05 NOTE — Progress Notes (Signed)
@  0040, pt removed CPAP and placed on 1LNC. @0130 , pt c/o discomfort, SOB and couldn't sleep, O2 sat checked 88 % on 1L and this RN placed pt back to 2L [place] O2 sat 97%, pt refuses to use CPAP, administered PRN meds and will continue to monitor.

## 2021-06-06 ENCOUNTER — Telehealth: Payer: Self-pay | Admitting: Primary Care

## 2021-06-06 DIAGNOSIS — J189 Pneumonia, unspecified organism: Secondary | ICD-10-CM | POA: Diagnosis not present

## 2021-06-06 DIAGNOSIS — R0902 Hypoxemia: Secondary | ICD-10-CM | POA: Diagnosis not present

## 2021-06-06 LAB — BASIC METABOLIC PANEL
Anion gap: 7 (ref 5–15)
BUN: 13 mg/dL (ref 8–23)
CO2: 27 mmol/L (ref 22–32)
Calcium: 9.9 mg/dL (ref 8.9–10.3)
Chloride: 105 mmol/L (ref 98–111)
Creatinine, Ser: 0.61 mg/dL (ref 0.44–1.00)
GFR, Estimated: >60 mL/min (ref >60)
Glucose, Bld: 110 mg/dL — ABNORMAL HIGH (ref 70–99)
Potassium: 3.8 mmol/L (ref 3.5–5.1)
Sodium: 139 mmol/L (ref 135–145)

## 2021-06-06 MED ORDER — FUROSEMIDE 10 MG/ML IJ SOLN
40.0000 mg | Freq: Once | INTRAMUSCULAR | Status: AC
  Administered 2021-06-06: 40 mg via INTRAVENOUS
  Filled 2021-06-06: qty 4

## 2021-06-06 NOTE — Progress Notes (Signed)
PROGRESS NOTE  [name] IDP:[id number] DOB: 06-22-1951 DOA: 06/03/2021 PCP: Camillia Herter, NP   LOS: 3 days   Brief Narrative / Interim history: 70 year old female with history of systolic CHF, bronchiectasis, hypertension, hyperlipidemia, OSA, recent right knee replacement comes into the hospital with dyspnea on exertion.  Symptoms started 3-4 days ago, he was becoming more more difficult for pressure breath when walking around.  She was using her inhalers without much relief and started to come to the hospital.  She also has been complaining of a dry cough.  She denies any fever or chills.  She was diagnosed with COVID about a month ago and completed treatment.  Subjective / 24h Interval events: She feels okay at rest.  [facility] yesterday and felt quite short of breath but better on 3 L of oxygen.  No chest pain, still complains of a dry cough  Assessment & Plan: Principal Problem Acute hypoxic respiratory failure due to multifocal pneumonia, viral pneumonia, underlying bronchiectasis and pulmonary fibrosis-on admission, due to elevated D-dimer she underwent a CT scan which was negative for PE but it did show patchy groundglass opacities in both lungs, potentially represented atypical pneumonia but also chronic interstitial disease/respiratory bronchiolitis.  Patient did not have any fever, chills, URI type symptoms prior to becoming short of breath this is less likely representing an acute process.  She had COVID about a month ago, and she has known pulmonary fibrosis and follows with Dr. Halford Chessman as an outpatient.  Case was discussed with Dr. Erin Fulling with pulmonology, reviewed scans and this appears to be an acute on chronic process possibly worsened by recent COVID infection last month.  Procalcitonin was undetectable, stop broad-spectrum IV antibiotics and will do doxycycline for 1 more day.  RVP was negative.  Continue steroids, repeat IV Lasix today.  We will do ambulatory O2  sats  Active Problems Elevated D-dimer-CT angiogram negative for PE.  Lower extremity Dopplers negative for acute DVT Chronic systolic CHF-seen by cardiology as an outpatient, she has a history of nonischemic cardiomyopathy with an EF as low as 30% in the past but now recovered.  IV Lasix as above Hypothyroidism-continue home Synthroid OSA-continue CPAP at night Bronchiectasis-continue DuoNebs as needed, placed on inhaled steroids per pulmonology recommendations Hyperlipidemia-continue home medications  Scheduled Meds:  budesonide (PULMICORT) nebulizer solution  0.25 mg Nebulization BID   carvedilol  12.5 mg Oral BID WC   chlorhexidine  15 mL Mouth Rinse BID   docusate sodium  100 mg Oral Daily   doxycycline  100 mg Oral Q12H   enoxaparin (LOVENOX) injection  40 mg Subcutaneous Q24H   ezetimibe  10 mg Oral Daily   fluticasone  2 spray Each Nare Daily   guaiFENesin  600 mg Oral BID   ipratropium-albuterol  3 mL Nebulization TID   latanoprost  1 drop Both Eyes QHS   levothyroxine  50 mcg Oral Q0600   loratadine  10 mg Oral Daily   montelukast  10 mg Oral QHS   pantoprazole  40 mg Oral Daily   polyvinyl alcohol  1 drop Both Eyes QHS   predniSONE  40 mg Oral Q breakfast   zolpidem  5 mg Oral QHS   Continuous Infusions:   PRN Meds:.acetaminophen **OR** acetaminophen, albuterol, baclofen, HYDROcodone-acetaminophen, lip balm, traMADol, traZODone  Diet Orders (From admission, onward)     Start     Ordered   06/03/21 1427  Diet Heart Room service appropriate? Yes; Fluid consistency: Thin  Diet effective now  Question Answer Comment  Room service appropriate? Yes   Fluid consistency: Thin      06/03/21 1426            DVT prophylaxis: enoxaparin (LOVENOX) injection 40 mg Start: 06/03/21 2200     Code Status: Full Code  Family Communication: No family at bedside  Status is: Inpatient  Remains inpatient appropriate because: Persistent hypoxia  Level of care:  Telemetry  Consultants:  none  Procedures:  none  Microbiology  COVID, influenza--10/15 Blood cultures-no growth to date, 10/15 MRSA-not detected Strep pneumo urinary antigen-negative  Antimicrobials: Ceftriaxone/azithromycin 10/15 >> 10/17 Doxycycline 10/17 >> 10/19   Objective: Vitals:   06/05/21 2044 06/05/21 2102 06/06/21 0509 06/06/21 0940  BP: 126/67  124/70 (!) 118/58  Pulse: 86  81 87  Resp: 18  18 18   Temp: 98.1 F (36.7 C)  97.7 F (36.5 C) 97.9 F (36.6 C)  TempSrc: Oral  Oral Oral  SpO2: 100% 98% 100% (!) 84%  Weight:      Height:        Intake/Output Summary (Last 24 hours) at 06/06/2021 1122 Last data filed at 06/05/2021 2300 Gross per 24 hour  Intake 120 ml  Output 1100 ml  Net -980 ml    Filed Weights   06/03/21 1830  Weight: 96.2 kg    Examination:  Constitutional: No distress, in bed Eyes: Anicteric ENMT: mmm Neck: normal, supple Respiratory: Persistent bibasilar rhonchi, no wheezing.  Moves air well Cardiovascular: Regular rate and rhythm, no murmurs, no peripheral edema Abdomen: Soft, nontender, nondistended, bowel sounds positive Musculoskeletal: no clubbing / cyanosis.  Skin: No rashes seen Neurologic: No focal deficits  Data Reviewed: I have independently reviewed following labs and imaging studies   CBC: Recent Labs  Lab 06/03/21 0927 06/04/21 0332 06/05/21 0420  WBC 10.4 8.0 7.1  NEUTROABS 8.4*  --   --   HGB 10.8* 9.4* 9.5*  HCT 35.2* 29.6* 31.2*  MCV 95.9 93.1 94.8  PLT 483* 415* 434*    Basic Metabolic Panel: Recent Labs  Lab 06/03/21 0927 06/04/21 0332 06/05/21 0420 06/06/21 0347  NA 134* 136 139 139  K 4.9 3.5 3.4* 3.8  CL 102 104 106 105  CO2 23 26 25 27   GLUCOSE 134* 109* 101* 110*  BUN 12 10 8 13   CREATININE 0.86 0.52 0.59 0.61  CALCIUM 9.6 9.0 9.2 9.9    Liver Function Tests: Recent Labs  Lab 06/04/21 0332 06/05/21 0420  AST 26 25  ALT 20 19  ALKPHOS 111 116  BILITOT 0.8 0.6  PROT  6.3* 6.5  ALBUMIN 2.7* 2.6*    Coagulation Profile: No results for input(s): INR, PROTIME in the last 168 hours. HbA1C: No results for input(s): HGBA1C in the last 72 hours. CBG: No results for input(s): GLUCAP in the last 168 hours.  Recent Results (from the past 240 hour(s))  Resp Panel by RT-PCR (Flu A&B, Covid) Nasopharyngeal Swab     Status: None   Collection Time: 06/03/21 12:25 PM   Specimen: Nasopharyngeal Swab; Nasopharyngeal(NP) swabs in vial transport medium  Result Value Ref Range Status   SARS Coronavirus 2 by RT PCR NEGATIVE NEGATIVE Final    Comment: (NOTE) SARS-CoV-2 target nucleic acids are NOT DETECTED.  The SARS-CoV-2 RNA is generally detectable in upper respiratory specimens during the acute phase of infection. The lowest concentration of SARS-CoV-2 viral copies this assay can detect is 138 copies/mL. A negative result does not preclude SARS-Cov-2 infection and should not  be used as the sole basis for treatment or other patient management decisions. A negative result may occur with  improper specimen collection/handling, submission of specimen other than nasopharyngeal swab, presence of viral mutation(s) within the areas targeted by this assay, and inadequate number of viral copies(<138 copies/mL). A negative result must be combined with clinical observations, patient history, and epidemiological information. The expected result is Negative.  Fact Sheet for Patients:  EntrepreneurPulse.com.au  Fact Sheet for Healthcare Providers:  IncredibleEmployment.be  This test is no t yet approved or cleared by the [place] FDA and  has been authorized for detection and/or diagnosis of SARS-CoV-2 by FDA under an Emergency Use Authorization (EUA). This EUA will remain  in effect (meaning this test can be used) for the duration of the COVID-19 declaration under Section 564(b)(1) of the Act, 21 U.S.C.section 360bbb-3(b)(1),  unless the authorization is terminated  or revoked sooner.       Influenza A by PCR NEGATIVE NEGATIVE Final   Influenza B by PCR NEGATIVE NEGATIVE Final    Comment: (NOTE) The Xpert Xpress SARS-CoV-2/FLU/RSV plus assay is intended as an aid in the diagnosis of influenza from Nasopharyngeal swab specimens and should not be used as a sole basis for treatment. Nasal washings and aspirates are unacceptable for Xpert Xpress SARS-CoV-2/FLU/RSV testing.  Fact Sheet for Patients: EntrepreneurPulse.com.au  Fact Sheet for Healthcare Providers: IncredibleEmployment.be  This test is not yet approved or cleared by the [place] FDA and has been authorized for detection and/or diagnosis of SARS-CoV-2 by FDA under an Emergency Use Authorization (EUA). This EUA will remain in effect (meaning this test can be used) for the duration of the COVID-19 declaration under Section 564(b)(1) of the Act, 21 U.S.C. section 360bbb-3(b)(1), unless the authorization is terminated or revoked.  Performed at [facility], [place] [address], [place], [place] [address]   Blood Culture (routine x 2)     Status: None (Preliminary result)   Collection Time: 06/03/21  1:00 PM   Specimen: BLOOD  Result Value Ref Range Status   Specimen Description   Final    BLOOD LEFT ANTECUBITAL Performed at [facility] [address], [place], [place] [address]    Special Requests   Final    BOTTLES DRAWN AEROBIC AND ANAEROBIC Blood Culture adequate volume Performed at [facility] [address] [place], [place] [address]    Culture   Final    NO GROWTH 3 DAYS Performed at [facility], [place] [address], [place], [place] [address]    Report Status PENDING  Incomplete  Blood Culture (routine x 2)     Status: None (Preliminary result)   Collection Time: 06/03/21  1:05 PM   Specimen: BLOOD  Result Value Ref Range Status    Specimen Description   Final    BLOOD BLOOD LEFT HAND Performed at [facility] [address], [place], [place] [address]    Special Requests   Final    BOTTLES DRAWN AEROBIC AND ANAEROBIC Blood Culture results may not be optimal due to an inadequate volume of blood received in culture bottles Performed at [facility] [address] [place], [place] [address]    Culture   Final    NO GROWTH 3 DAYS Performed at [facility], [place] [address], [place],  [address]    Report Status PENDING  Incomplete  MRSA Next Gen by PCR, Nasal     Status:  None   Collection Time: 06/03/21  2:27 PM   Specimen: Nasal Mucosa; Nasal Swab  Result Value Ref Range Status   MRSA by PCR Next Gen NOT DETECTED NOT DETECTED Final    Comment: (NOTE) The GeneXpert MRSA Assay (FDA approved for NASAL specimens only), is one component of a comprehensive MRSA colonization surveillance program. It is not intended to diagnose MRSA infection nor to guide or monitor treatment for MRSA infections. Test performance is not FDA approved in patients less than 62 years old. Performed at [facility], [place] [address], [place], [place] [address]   Respiratory (~20 pathogens) panel by PCR     Status: None   Collection Time: 06/04/21  6:20 AM   Specimen: Nasopharyngeal Swab; Respiratory  Result Value Ref Range Status   Adenovirus NOT DETECTED NOT DETECTED Final   Coronavirus 229E NOT DETECTED NOT DETECTED Final    Comment: (NOTE) The Coronavirus on the Respiratory Panel, DOES NOT test for the novel  Coronavirus (2019 nCoV)    Coronavirus HKU1 NOT DETECTED NOT DETECTED Final   Coronavirus NL63 NOT DETECTED NOT DETECTED Final   Coronavirus OC43 NOT DETECTED NOT DETECTED Final   Metapneumovirus NOT DETECTED NOT DETECTED Final   Rhinovirus / Enterovirus NOT DETECTED NOT DETECTED Final   Influenza A NOT DETECTED NOT DETECTED Final   Influenza B NOT DETECTED  NOT DETECTED Final   Parainfluenza Virus 1 NOT DETECTED NOT DETECTED Final   Parainfluenza Virus 2 NOT DETECTED NOT DETECTED Final   Parainfluenza Virus 3 NOT DETECTED NOT DETECTED Final   Parainfluenza Virus 4 NOT DETECTED NOT DETECTED Final   Respiratory Syncytial Virus NOT DETECTED NOT DETECTED Final   Bordetella pertussis NOT DETECTED NOT DETECTED Final   Bordetella Parapertussis NOT DETECTED NOT DETECTED Final   Chlamydophila pneumoniae NOT DETECTED NOT DETECTED Final   Mycoplasma pneumoniae NOT DETECTED NOT DETECTED Final    Comment: Performed at [facility], [place]. [address], [place], [place] [address]      Radiology Studies: No results found.  Marzetta Board, MD, PhD [facility]  Between 7 am - 7 pm I am available, please contact me via Amion (for emergencies) or Securechat (non urgent messages)  Between 7 pm - 7 am I am not available, please contact night coverage MD/APP via Amion

## 2021-06-06 NOTE — Telephone Encounter (Signed)
Will route to PCC's to follow up on this order.  Looks like this was already sent to [facility] back in September.

## 2021-06-06 NOTE — Progress Notes (Signed)
Reviewed and agree with assessment/plan.   Chesley Mires, MD [facility] Pulmonary/Critical Care 06/06/2021, 8:46 AM Pager:  [phone]

## 2021-06-06 NOTE — Telephone Encounter (Signed)
I have faxed the notes and the original order looks like there is a CMN that Dr Halford Chessman has to sign today then I will send that

## 2021-06-06 NOTE — Plan of Care (Signed)
  Problem: Activity: Goal: Ability to tolerate increased activity will improve Outcome: Progressing   Problem: Clinical Measurements: Goal: Ability to maintain a body temperature in the normal range will improve Outcome: Progressing   Problem: Respiratory: Goal: Ability to maintain a clear airway will improve Outcome: Progressing   Problem: Activity: Goal: Risk for activity intolerance will decrease Outcome: Progressing   Problem: Pain Managment: Goal: General experience of comfort will improve Outcome: Progressing

## 2021-06-06 NOTE — Progress Notes (Addendum)
SATURATION QUALIFICATIONS: (This note is used to comply with regulatory documentation for home oxygen)  Patient Saturations on Room Air at Rest = 95%  Patient Saturations on Room Air while Ambulating = 85%  Patient Saturations on 2 Liters of oxygen while Ambulating = 93%  Please briefly explain why patient needs home oxygen: SOB with activity, during ambulation on 1L 87% but up to 95% on 2L

## 2021-06-07 ENCOUNTER — Telehealth: Payer: Self-pay | Admitting: Pulmonary Disease

## 2021-06-07 DIAGNOSIS — J189 Pneumonia, unspecified organism: Secondary | ICD-10-CM | POA: Diagnosis not present

## 2021-06-07 DIAGNOSIS — R0902 Hypoxemia: Secondary | ICD-10-CM | POA: Diagnosis not present

## 2021-06-07 LAB — BASIC METABOLIC PANEL
Anion gap: 6 (ref 5–15)
BUN: 17 mg/dL (ref 8–23)
CO2: 29 mmol/L (ref 22–32)
Calcium: 9.7 mg/dL (ref 8.9–10.3)
Chloride: 103 mmol/L (ref 98–111)
Creatinine, Ser: 0.65 mg/dL (ref 0.44–1.00)
GFR, Estimated: >60 mL/min (ref >60)
Glucose, Bld: 104 mg/dL — ABNORMAL HIGH (ref 70–99)
Potassium: 3.7 mmol/L (ref 3.5–5.1)
Sodium: 138 mmol/L (ref 135–145)

## 2021-06-07 MED ORDER — POTASSIUM CHLORIDE CRYS ER 20 MEQ PO TBCR
40.0000 meq | EXTENDED_RELEASE_TABLET | Freq: Once | ORAL | Status: AC
  Administered 2021-06-07: 40 meq via ORAL
  Filled 2021-06-07: qty 2

## 2021-06-07 MED ORDER — DULERA 100-5 MCG/ACT IN AERO: 2.0000 | INHALATION_SPRAY | Freq: Two times a day (BID) | RESPIRATORY_TRACT | 0 refills | Status: DC

## 2021-06-07 MED ORDER — PREDNISONE 10 MG PO TABS: ORAL_TABLET | ORAL | 0 refills | Status: DC

## 2021-06-07 MED ORDER — FUROSEMIDE 10 MG/ML IJ SOLN
40.0000 mg | Freq: Once | INTRAMUSCULAR | Status: AC
  Administered 2021-06-07: 40 mg via INTRAVENOUS
  Filled 2021-06-07: qty 4

## 2021-06-07 MED ORDER — PREDNISONE 10 MG PO TABS: ORAL_TABLET | ORAL | 0 refills | Status: AC

## 2021-06-07 MED ORDER — GUAIFENESIN ER 600 MG PO TB12: 600.0000 mg | ORAL_TABLET | Freq: Two times a day (BID) | ORAL | 0 refills | Status: AC

## 2021-06-07 MED ORDER — GUAIFENESIN ER 600 MG PO TB12: 600.0000 mg | ORAL_TABLET | Freq: Two times a day (BID) | ORAL | 0 refills | Status: DC

## 2021-06-07 NOTE — Telephone Encounter (Signed)
Called patient but she did not answer. Left message for her to call back.  

## 2021-06-07 NOTE — Progress Notes (Signed)
Patient discharged home, discharge instructions given and explained to patient, she verbalized understanding, patient denies any pain/distress. No wound or pressure  injury noted. Accompanied home by son, transported to the car by staff. [name]

## 2021-06-07 NOTE — Discharge Summary (Signed)
Physician Discharge Summary  [name] NUU:[id number] DOB: [birth year] DOA: 06/03/2021  PCP: Camillia Herter, NP  Admit date: 06/03/2021 Discharge date: 06/07/2021  Admitted From: home Disposition:  home  Recommendations for Outpatient Follow-up:  Follow up with PCP in 1-2 weeks Follow up with Dr Halford Chessman in 1-2 weeks  Home Health: none Equipment/Devices: home O2  Discharge Condition: stable CODE STATUS: Full code Diet recommendation: regular  HPI: Per admitting MD,  [name] is a 70 y.o. female with medical history significant of HFrEF, bronchiectasis, HTN, HLD, OSA. Presenting with DOE. Symptoms started 3 days ago. She noticed that it was becoming more difficult for her to catch her breath when going to the bathroom. She was using her inhaler up to 5 times a day and taking her allergy medicines, but neither were helping. Her symptoms worsened to the point that she did not want to move. She had dry cough as well. This morning when her symptoms did not improve, she decided to come to the ED for help. Of note, she had a right knee replacement 2 weeks ago. She was on ASA. She also reports a positive COVID Dx from early September. She completed a full course of paxlovid.  Hospital Course / Discharge diagnoses: Principal Problem Acute hypoxic respiratory failure due acute on chronic, underlying bronchiectasis and pulmonary fibrosis-on admission, due to elevated D-dimer she underwent a CT scan which was negative for PE but it did show patchy groundglass opacities in both lungs, potentially represented atypical pneumonia but also chronic interstitial disease/respiratory bronchiolitis.  Patient did not have any fever, chills, URI type symptoms prior to becoming short of breath this is less likely representing an acute process, pneumonia ruled out.  She had COVID about a month ago, and she has known pulmonary fibrosis and follows with Dr. Halford Chessman as an outpatient.   Case was discussed with Dr. Erin Fulling with pulmonology, reviewed scans and this appears to be an acute on chronic process possibly worsened by recent COVID infection last month.  Procalcitonin was undetectable.  RVP was negative.  She was placed on steroids and received diuretics to keep on the dry side. Clinically has improved, able to ambulate in the hallway but continues to require O2 and will be arranged on discharge. She will be given a steroid taper and was advised to follow up with Dr Halford Chessman in 1-2 weeks   Active Problems Elevated D-dimer-CT angiogram negative for PE.  Lower extremity Dopplers negative for acute DVT Chronic systolic CHF-seen by cardiology as an outpatient, she has a history of nonischemic cardiomyopathy with an EF as low as 30% in the past but now recovered. Euvolemic on discharge. On Lasix at home Hypothyroidism-continue home Synthroid OSA-continue CPAP at night Bronchiectasis-placed on inhaled steroids per pulmonology recommendations Hyperlipidemia-continue home medications  Sepsis ruled out   Discharge Instructions   Allergies as of 06/07/2021       Reactions   Ace Inhibitors Swelling   Angioedema; makes tongue "break out"   Amitriptyline Hcl Other (See Comments)    makes her too sleepy!   Atorvastatin Other (See Comments)   SEVERE MYALGIA   Cymbalta [duloxetine Hcl] Nausea Only, Other (See Comments)   Sleepiness/ sick   Dilantin [phenytoin Sodium Extended] Rash   Severe rash   Paroxetine Nausea Only   Rapid heartbeat   Ramipril Other (See Comments)   TONGUE ULCERS   Rosuvastatin Other (See Comments)   SEVERE MYALGIA   Carbamazepine Rash   Codeine Itching  Paxlovid [nirmatrelvir-ritonavir] Itching   Phenytoin Sodium Extended Rash   Simvastatin Other (See Comments)   Increase in CK, myalgias   Wellbutrin [bupropion] Palpitations        Medication List     STOP taking these medications    azithromycin 250 MG tablet Commonly known as:  ZITHROMAX   oxyCODONE 5 MG immediate release tablet Commonly known as: Oxy IR/ROXICODONE       TAKE these medications    acetaminophen 500 MG tablet Commonly known as: TYLENOL Take 500 mg by mouth every 6 (six) hours as needed for moderate pain.   albuterol (2.5 MG/3ML) 0.083% nebulizer solution Commonly known as: PROVENTIL USE 1 VIAL IN NEBULIZER EVERY 6 HOURS What changed: See the new instructions.   albuterol 108 (90 Base) MCG/ACT inhaler Commonly known as: Ventolin HFA Inhale 2 puffs into the lungs every 6 (six) hours as needed for wheezing or shortness of breath. What changed: Another medication with the same name was changed. Make sure you understand how and when to take each.   aspirin 325 MG EC tablet Take 1 tablet (325 mg total) by mouth 2 (two) times daily.   baclofen 10 MG tablet Commonly known as: LIORESAL TAKE 1 TABLET BY MOUTH AT BEDTIME AS NEEDED FOR MUSCLE SPASM What changed: See the new instructions.   calcium carbonate 1250 (500 Ca) MG tablet Commonly known as: OS-CAL - dosed in mg of elemental calcium Take 1 tablet by mouth daily.   carvedilol 12.5 MG tablet Commonly known as: COREG Take 1 tablet (12.5 mg total) by mouth 2 (two) times daily.   cetirizine 10 MG tablet Commonly known as: ZYRTEC Take 10 mg by mouth daily as needed for allergies.   Chloraseptic 1.4 % Liqd Generic drug: phenol Use as directed 1 spray in the mouth or throat 3 (three) times daily as needed for throat irritation / pain.   Dialyvite Vitamin D 5000 125 MCG (5000 UT) capsule Generic drug: Cholecalciferol Take 5,000 Units by mouth daily.   diclofenac Sodium 1 % Gel Commonly known as: VOLTAREN Apply 2-4 grams to affected joint 4 times daily as needed. What changed:  how much to take how to take this when to take this reasons to take this additional instructions   Dulera 100-5 MCG/ACT Aero Generic drug: mometasone-formoterol Inhale 2 puffs into the lungs in the  morning and at bedtime.   ezetimibe 10 MG tablet Commonly known as: ZETIA Take 1 tablet (10 mg total) by mouth daily.   fexofenadine 180 MG tablet Commonly known as: ALLEGRA Take 180 mg by mouth daily as needed for allergies (alternates with zyrtec sometimes).   fluticasone 50 MCG/ACT nasal spray Commonly known as: FLONASE Place 2 sprays into both nostrils daily as needed for allergies or rhinitis. What changed: when to take this   Flutter Devi 1 Device by Does not apply route as needed.   furosemide 40 MG tablet Commonly known as: LASIX Take 20-40 mg by mouth as needed for edema.   guaiFENesin 600 MG 12 hr tablet Commonly known as: MUCINEX Take 1 tablet (600 mg total) by mouth 2 (two) times daily for 10 days.   HYDROcodone-acetaminophen 5-325 MG tablet Commonly known as: NORCO/VICODIN Take 1 tablet by mouth every 6 (six) hours as needed for pain.   hydroxypropyl methylcellulose / hypromellose 2.5 % ophthalmic solution Commonly known as: ISOPTO TEARS / GONIOVISC Place 1 drop into both eyes at bedtime.   latanoprost 0.005 % ophthalmic solution Commonly known as: Ivin Poot  Place 1 drop into both eyes at bedtime.   levothyroxine 50 MCG tablet Commonly known as: SYNTHROID Take 1 tablet (50 mcg total) by mouth daily.   magnesium oxide 400 MG tablet Commonly known as: MAG-OX Take 1 tablet (400 mg total) by mouth in the morning, at noon, and at bedtime. What changed: when to take this   methocarbamol 500 MG tablet Commonly known as: ROBAXIN Take 1-2 tablets (500-1,000 mg total) by mouth every 6 (six) hours as needed for muscle spasms.   mometasone 0.1 % lotion Commonly known as: ELOCON Apply 1 application topically daily as needed (rash).   montelukast 10 MG tablet Commonly known as: SINGULAIR Take 1 tablet (10 mg total) by mouth at bedtime.   MULTIVITAMIN PO Take 1 tablet by mouth daily.   nitroGLYCERIN 0.4 MG SL tablet Commonly known as: NITROSTAT DISSOLVE ONE  TABLET UNDER THE TONGUE EVERY 5 MINUTES AS NEEDED FOR CHEST PAIN.  DO NOT EXCEED A TOTAL OF 3 DOSES IN 15 MINUTES What changed: See the new instructions.   pantoprazole 40 MG tablet Commonly known as: PROTONIX Take 1 tablet (40 mg total) by mouth daily. What changed:  when to take this additional instructions   polyethylene glycol powder 17 GM/SCOOP powder Commonly known as: GLYCOLAX/MIRALAX Take 17 grams by mouth daily   potassium chloride 10 MEQ tablet Commonly known as: KLOR-CON TAKE 1/2 TABLET EVERY DAY What changed:  when to take this reasons to take this   predniSONE 10 MG tablet Commonly known as: DELTASONE Take 4 tablets (40 mg total) by mouth daily for 2 days, THEN 3 tablets (30 mg total) daily for 3 days, THEN 2 tablets (20 mg total) daily for 3 days, THEN 1 tablet (10 mg total) daily for 3 days. Start taking on: June 07, 2021 What changed: See the new instructions.   sodium chloride HYPERTONIC 3 % nebulizer solution Take by nebulization 2 (two) times daily as needed for cough. 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   traMADol 50 MG tablet Commonly known as: ULTRAM Take 1 tablet (50 mg total) by mouth 2 (two) times daily. What changed:  when to take this reasons to take this   traZODone 50 MG tablet Commonly known as: DESYREL Take 0.5-1 tablets (25-50 mg total) by mouth at bedtime as needed for sleep. What changed: how much to take   vitamin C 1000 MG tablet Take 1,000 mg by mouth daily.   zolpidem 5 MG tablet Commonly known as: AMBIEN Take 1 tablet (5 mg total) by mouth at bedtime as needed for sleep. What changed: when to take this               Durable Medical Equipment  (From admission, onward)           Start     Ordered   06/07/21 0832  For home use only DME oxygen  Once       Comments: Pulmonary fibrosis  Question Answer Comment  Length of Need Lifetime   Mode or (Route) Nasal cannula   Liters per Minute 2   Frequency Continuous (stationary  and portable oxygen unit needed)   Oxygen delivery system Gas      06/07/21 0831            Follow-up Information     Chesley Mires, MD Follow up.   Specialty: Pulmonary Disease Contact information: [address] [place]  [phone]  Consultations: None   Procedures/Studies:  DG Chest 2 View  Result Date: 05/18/2021 CLINICAL DATA:  70 year old female under preoperative evaluation. EXAM: CHEST - 2 VIEW COMPARISON:  Chest x-ray 10/23/2019. FINDINGS: Lung volumes are low. Diffuse peribronchial cuffing and widespread areas of interstitial prominence are again noted throughout the mid to lower lungs bilaterally. No consolidative airspace disease. No pleural effusions. No pneumothorax. No pulmonary nodule or mass noted. Pulmonary vasculature and the cardiomediastinal silhouette are within normal limits. Atherosclerosis in the thoracic aorta. Left-sided pacemaker/AICD with lead tip projecting over the expected location of the right ventricle. IMPRESSION: 1. No radiographic evidence of acute cardiopulmonary disease. 2. Low lung volumes with chronic findings in the lungs suggestive of potential interstitial lung disease. Further evaluation with nonemergent high-resolution chest CT is recommended in the near future to better evaluate these findings. 3. Aortic atherosclerosis. Electronically Signed   By: Vinnie Langton M.D.   On: 05/18/2021 13:47   CT Angio Chest PE W/Cm &/Or Wo Cm  Result Date: 06/03/2021 CLINICAL DATA:  Shortness of breath. EXAM: CT ANGIOGRAPHY CHEST WITH CONTRAST TECHNIQUE: Multidetector CT imaging of the chest was performed using the standard protocol during bolus administration of intravenous contrast. Multiplanar CT image reconstructions and MIPs were obtained to evaluate the vascular anatomy. CONTRAST:  72mL OMNIPAQUE IOHEXOL 350 MG/ML SOLN COMPARISON:  Chest CT angiogram dated 03/24/2019. FINDINGS: Cardiovascular:  Majority of the peripheral segmental and subsegmental pulmonary arteries cannot be evaluated due to extensive patient breathing motion artifact. There is no pulmonary embolism identified within the main or central inter lobar pulmonary arteries bilaterally. Cardiomegaly. No pericardial effusion. No thoracic aortic aneurysm or evidence of aortic dissection. Mediastinum/Nodes: No mass or enlarged lymph nodes are seen within the mediastinum or perihilar regions. Esophagus is unremarkable. Trachea and central bronchi are unremarkable. Lungs/Pleura: Scattered ground-glass opacities within both lungs, superimposed on chronic w interstitial w lung disease/fibrosis. Upper Abdomen: No acute findings. Musculoskeletal: Degenerative spondylosis of the kyphotic thoracic spine, mild to moderate in degree. No acute-appearing osseous abnormality. Review of the MIP images confirms the above findings. IMPRESSION: 1. Patchy ground-glass opacities within both lungs. Differential includes atypical pneumonias such as viral or fungal, interstitial pneumonias, edema related to volume overload/CHF, chronic interstitial diseases, hypersensitivity pneumonitis, and respiratory bronchiolitis. COVID pneumonia can certainly have this appearance. 2. Evaluation for pulmonary embolism is significantly limited by patient breathing motion artifact. No pulmonary embolism is seen within the main or central interlobar pulmonary arteries. However, majority of the peripheral segmental and subsegmental pulmonary arteries cannot be evaluated due to the extensive patient breathing motion artifact. Small peripheral pulmonary embolism cannot be excluded. 3. Cardiomegaly. Electronically Signed   By: Franki Cabot M.D.   On: 06/03/2021 12:20   CUP PACEART REMOTE DEVICE CHECK  Result Date: 06/01/2021 Scheduled remote reviewed. Normal device function.  6 NSVT this cycle, available EGM's show 4-6 beats Next remote 91 days. LR  VAS [place] LOWER EXTREMITY VENOUS  (DVT) (ONLY [facility] & [facility])  Result Date: 06/04/2021  Lower Venous DVT Study Patient Name:  [name]  Date of Exam:   06/04/2021 Medical Rec #: [id number]                   Accession #:    [id number] Date of Birth: Feb 17, 1951                   Patient Gender: F Patient Age:   51 years Exam Location:  [facility] Procedure:  VAS [place] LOWER EXTREMITY VENOUS (DVT) Referring Phys: Cherylann Ratel --------------------------------------------------------------------------------  Indications: Swelling.  Risk Factors: Surgery. Limitations: Poor ultrasound/tissue interface. Comparison Study: No prior studies. Performing Technologist: Oliver Hum RVT  Examination Guidelines: A complete evaluation includes B-mode imaging, spectral Doppler, color Doppler, and power Doppler as needed of all accessible portions of each vessel. Bilateral testing is considered an integral part of a complete examination. Limited examinations for reoccurring indications may be performed as noted. The reflux portion of the exam is performed with the patient in reverse Trendelenburg.  +---------+---------------+---------+-----------+----------+--------------+ RIGHT    CompressibilityPhasicitySpontaneityPropertiesThrombus Aging +---------+---------------+---------+-----------+----------+--------------+ CFV      Full           Yes      Yes                                 +---------+---------------+---------+-----------+----------+--------------+ SFJ      Full                                                        +---------+---------------+---------+-----------+----------+--------------+ FV Prox  Full                                                        +---------+---------------+---------+-----------+----------+--------------+ FV Mid   Full                                                        +---------+---------------+---------+-----------+----------+--------------+ FV DistalFull                                                         +---------+---------------+---------+-----------+----------+--------------+ PFV      Full                                                        +---------+---------------+---------+-----------+----------+--------------+ POP      Full           Yes      Yes                                 +---------+---------------+---------+-----------+----------+--------------+ PTV      Full                                                        +---------+---------------+---------+-----------+----------+--------------+ PERO     Full                                                        +---------+---------------+---------+-----------+----------+--------------+   +---------+---------------+---------+-----------+----------+--------------+  LEFT     CompressibilityPhasicitySpontaneityPropertiesThrombus Aging +---------+---------------+---------+-----------+----------+--------------+ CFV      Full           Yes      Yes                                 +---------+---------------+---------+-----------+----------+--------------+ SFJ      Full                                                        +---------+---------------+---------+-----------+----------+--------------+ FV Prox  Full                                                        +---------+---------------+---------+-----------+----------+--------------+ FV Mid   Full                                                        +---------+---------------+---------+-----------+----------+--------------+ FV DistalFull                                                        +---------+---------------+---------+-----------+----------+--------------+ PFV      Full                                                        +---------+---------------+---------+-----------+----------+--------------+ POP      Full           Yes      Yes                                  +---------+---------------+---------+-----------+----------+--------------+ PTV      Full                                                        +---------+---------------+---------+-----------+----------+--------------+ PERO     Full                                                        +---------+---------------+---------+-----------+----------+--------------+     Summary: RIGHT: - There is no evidence of deep vein thrombosis in the lower extremity.  - No cystic structure found in the popliteal fossa.  LEFT: - There is no evidence of deep vein thrombosis in the lower extremity.  - No  cystic structure found in the popliteal fossa.  *See table(s) above for measurements and observations. Electronically signed by Monica Martinez MD on 06/04/2021 at 10:05:22 AM.    Final      Subjective: - no chest pain, shortness of breath, no abdominal pain, nausea or vomiting.   Discharge Exam: BP (!) 123/59 (BP Location: Right Arm)   Pulse 78   Temp 98 F (36.7 C) (Oral)   Resp 20   Ht 5' 6.5" (1.689 m)   Wt 96.2 kg   LMP  (LMP Unknown)   SpO2 93%   BMI 33.72 kg/m   General: Pt is alert, awake, not in acute distress Cardiovascular: RRR, S1/S2 +, no rubs, no gallops Respiratory: CTA bilaterally, no wheezing, no rhonchi Abdominal: Soft, NT, ND, bowel sounds + Extremities: no edema, no cyanosis    The results of significant diagnostics from this hospitalization (including imaging, microbiology, ancillary and laboratory) are listed below for reference.     Microbiology: Recent Results (from the past 240 hour(s))  Resp Panel by RT-PCR (Flu A&B, Covid) Nasopharyngeal Swab     Status: None   Collection Time: 06/03/21 12:25 PM   Specimen: Nasopharyngeal Swab; Nasopharyngeal(NP) swabs in vial transport medium  Result Value Ref Range Status   SARS Coronavirus 2 by RT PCR NEGATIVE NEGATIVE Final    Comment: (NOTE) SARS-CoV-2 target nucleic acids are NOT DETECTED.  The SARS-CoV-2 RNA  is generally detectable in upper respiratory specimens during the acute phase of infection. The lowest concentration of SARS-CoV-2 viral copies this assay can detect is 138 copies/mL. A negative result does not preclude SARS-Cov-2 infection and should not be used as the sole basis for treatment or other patient management decisions. A negative result may occur with  improper specimen collection/handling, submission of specimen other than nasopharyngeal swab, presence of viral mutation(s) within the areas targeted by this assay, and inadequate number of viral copies(<138 copies/mL). A negative result must be combined with clinical observations, patient history, and epidemiological information. The expected result is Negative.  Fact Sheet for Patients:  EntrepreneurPulse.com.au  Fact Sheet for Healthcare Providers:  IncredibleEmployment.be  This test is no t yet approved or cleared by the [place] FDA and  has been authorized for detection and/or diagnosis of SARS-CoV-2 by FDA under an Emergency Use Authorization (EUA). This EUA will remain  in effect (meaning this test can be used) for the duration of the COVID-19 declaration under Section 564(b)(1) of the Act, 21 U.S.C.section 360bbb-3(b)(1), unless the authorization is terminated  or revoked sooner.       Influenza A by PCR NEGATIVE NEGATIVE Final   Influenza B by PCR NEGATIVE NEGATIVE Final    Comment: (NOTE) The Xpert Xpress SARS-CoV-2/FLU/RSV plus assay is intended as an aid in the diagnosis of influenza from Nasopharyngeal swab specimens and should not be used as a sole basis for treatment. Nasal washings and aspirates are unacceptable for Xpert Xpress SARS-CoV-2/FLU/RSV testing.  Fact Sheet for Patients: EntrepreneurPulse.com.au  Fact Sheet for Healthcare Providers: IncredibleEmployment.be  This test is not yet approved or cleared by the  [place] FDA and has been authorized for detection and/or diagnosis of SARS-CoV-2 by FDA under an Emergency Use Authorization (EUA). This EUA will remain in effect (meaning this test can be used) for the duration of the COVID-19 declaration under Section 564(b)(1) of the Act, 21 U.S.C. section 360bbb-3(b)(1), unless the authorization is terminated or revoked.  Performed at [facility], [place] [name], [place], [place] [address]  Blood Culture (routine x 2)     Status: None (Preliminary result)   Collection Time: 06/03/21  1:00 PM   Specimen: BLOOD  Result Value Ref Range Status   Specimen Description   Final    BLOOD LEFT ANTECUBITAL Performed at [facility] [address], [place], [place] [address]    Special Requests   Final    BOTTLES DRAWN AEROBIC AND ANAEROBIC Blood Culture adequate volume Performed at [facility] [address] [place], [place] [address]    Culture   Final    NO GROWTH 4 DAYS Performed at [facility], [place] [address], [place], [place] [address]    Report Status PENDING  Incomplete  Blood Culture (routine x 2)     Status: None (Preliminary result)   Collection Time: 06/03/21  1:05 PM   Specimen: BLOOD  Result Value Ref Range Status   Specimen Description   Final    BLOOD BLOOD LEFT HAND Performed at [facility] [address], [place], [place] [address]    Special Requests   Final    BOTTLES DRAWN AEROBIC AND ANAEROBIC Blood Culture results may not be optimal due to an inadequate volume of blood received in culture bottles Performed at [facility] [address] [place], [place] [address]    Culture   Final    NO GROWTH 4 DAYS Performed at [facility], [place] [address] [place], [place] [address]    Report Status PENDING  Incomplete  MRSA Next Gen by PCR, Nasal     Status: None   Collection Time: 06/03/21  2:27 PM   Specimen:  Nasal Mucosa; Nasal Swab  Result Value Ref Range Status   MRSA by PCR Next Gen NOT DETECTED NOT DETECTED Final    Comment: (NOTE) The GeneXpert MRSA Assay (FDA approved for NASAL specimens only), is one component of a comprehensive MRSA colonization surveillance program. It is not intended to diagnose MRSA infection nor to guide or monitor treatment for MRSA infections. Test performance is not FDA approved in patients less than 30 years old. Performed at [facility], [place] [address] [place], [place] [address]   Respiratory (~20 pathogens) panel by PCR     Status: None   Collection Time: 06/04/21  6:20 AM   Specimen: Nasopharyngeal Swab; Respiratory  Result Value Ref Range Status   Adenovirus NOT DETECTED NOT DETECTED Final   Coronavirus 229E NOT DETECTED NOT DETECTED Final    Comment: (NOTE) The Coronavirus on the Respiratory Panel, DOES NOT test for the novel  Coronavirus (2019 nCoV)    Coronavirus HKU1 NOT DETECTED NOT DETECTED Final   Coronavirus NL63 NOT DETECTED NOT DETECTED Final   Coronavirus OC43 NOT DETECTED NOT DETECTED Final   Metapneumovirus NOT DETECTED NOT DETECTED Final   Rhinovirus / Enterovirus NOT DETECTED NOT DETECTED Final   Influenza A NOT DETECTED NOT DETECTED Final   Influenza B NOT DETECTED NOT DETECTED Final   Parainfluenza Virus 1 NOT DETECTED NOT DETECTED Final   Parainfluenza Virus 2 NOT DETECTED NOT DETECTED Final   Parainfluenza Virus 3 NOT DETECTED NOT DETECTED Final   Parainfluenza Virus 4 NOT DETECTED NOT DETECTED Final   Respiratory Syncytial Virus NOT DETECTED NOT DETECTED Final   Bordetella pertussis NOT DETECTED NOT DETECTED Final   Bordetella Parapertussis NOT DETECTED NOT DETECTED Final   Chlamydophila pneumoniae NOT DETECTED NOT DETECTED Final   Mycoplasma pneumoniae NOT DETECTED  NOT DETECTED Final    Comment: Performed at [facility], [place] [address], [place], [place] [address]     Labs: Basic Metabolic  Panel: Recent Labs  Lab 06/03/21 [phone] 06/04/21 0332 06/05/21 0420 06/06/21 0347 06/07/21 0359  NA 134* 136 139 139 138  K 4.9 3.5 3.4* 3.8 3.7  CL 102 104 106 105 103  CO2 23 26 25 27 29   GLUCOSE 134* 109* 101* 110* 104*  BUN 12 10 8 13 17   CREATININE 0.86 0.52 0.59 0.61 0.65  CALCIUM 9.6 9.0 9.2 9.9 9.7   Liver Function Tests: Recent Labs  Lab 06/04/21 0332 06/05/21 0420  AST 26 25  ALT 20 19  ALKPHOS 111 116  BILITOT 0.8 0.6  PROT 6.3* 6.5  ALBUMIN 2.7* 2.6*   CBC: Recent Labs  Lab 06/03/21 0927 06/04/21 0332 06/05/21 0420  WBC 10.4 8.0 7.1  NEUTROABS 8.4*  --   --   HGB 10.8* 9.4* 9.5*  HCT 35.2* 29.6* 31.2*  MCV 95.9 93.1 94.8  PLT 483* 415* 434*   CBG: No results for input(s): GLUCAP in the last 168 hours. Hgb A1c No results for input(s): HGBA1C in the last 72 hours. Lipid Profile No results for input(s): CHOL, HDL, LDLCALC, TRIG, CHOLHDL, LDLDIRECT in the last 72 hours. Thyroid function studies No results for input(s): TSH, T4TOTAL, T3FREE, THYROIDAB in the last 72 hours.  Invalid input(s): FREET3 Urinalysis    Component Value Date/Time   COLORURINE YELLOW 02/03/2016 1414   APPEARANCEUR Cloudy (A) 06/23/2020 1149   LABSPEC 1.021 02/03/2016 1414   PHURINE 6.0 02/03/2016 1414   GLUCOSEU Negative 06/23/2020 1149   HGBUR NEGATIVE 02/03/2016 1414   HGBUR trace-intact 03/29/2010 1457   BILIRUBINUR neg 04/10/2021 1154   BILIRUBINUR Negative 06/23/2020 1149   KETONESUR NEGATIVE 02/03/2016 1414   PROTEINUR Negative 04/10/2021 1154   PROTEINUR Trace 06/23/2020 1149   PROTEINUR NEGATIVE 02/03/2016 1414   UROBILINOGEN 0.2 04/10/2021 1154   UROBILINOGEN 0.2 03/29/2010 1457   NITRITE neg 04/10/2021 1154   NITRITE Negative 06/23/2020 1149   NITRITE NEGATIVE 02/03/2016 1414   LEUKOCYTESUR Negative 04/10/2021 1154   LEUKOCYTESUR Negative 06/23/2020 1149    FURTHER DISCHARGE INSTRUCTIONS:   Get Medicines reviewed and adjusted: Please take all your  medications with you for your next visit with your Primary MD   Laboratory/radiological data: Please request your Primary MD to go over all hospital tests and procedure/radiological results at the follow up, please ask your Primary MD to get all Hospital records sent to his/her office.   In some cases, they will be blood work, cultures and biopsy results pending at the time of your discharge. Please request that your primary care M.D. goes through all the records of your hospital data and follows up on these results.   Also Note the following: If you experience worsening of your admission symptoms, develop shortness of breath, life threatening emergency, suicidal or homicidal thoughts you must seek medical attention immediately by calling 911 or calling your MD immediately  if symptoms less severe.   You must read complete instructions/literature along with all the possible adverse reactions/side effects for all the Medicines you take and that have been prescribed to you. Take any new Medicines after you have completely understood and accpet all the possible adverse reactions/side effects.    Do not drive when taking Pain medications or sleeping medications (Benzodaizepines)   Do not take more than prescribed Pain, Sleep and Anxiety Medications. It is not  advisable to combine anxiety,sleep and pain medications without talking with your primary care practitioner   Special Instructions: If you have smoked or chewed Tobacco  in the last 2 yrs please stop smoking, stop any regular Alcohol  and or any Recreational drug use.   Wear Seat belts while driving.   Please note: You were cared for by a hospitalist during your hospital stay. Once you are discharged, your primary care physician will handle any further medical issues. Please note that NO REFILLS for any discharge medications will be authorized once you are discharged, as it is imperative that you return to your primary care physician (or  establish a relationship with a primary care physician if you do not have one) for your post hospital discharge needs so that they can reassess your need for medications and monitor your lab values.  Time coordinating discharge: 40 minutes  SIGNED:  Marzetta Board, MD, PhD 06/07/2021, 8:35 AM

## 2021-06-07 NOTE — TOC Initial Note (Signed)
Transition of Care [facility]) - Initial/Assessment Note    Patient Details  Name: [name] MRN: [id number] Date of Birth: 15-Nov-1950  [facility] CM/SW Contact:    Leeroy Cha, RN Phone Number: 06/07/2021, 8:35 AM  Clinical Narrative:                 [phone] o2 ordered through [employer] patient is for dc  Expected Discharge Plan: Home/Self Care Barriers to Discharge: No Barriers Identified   Patient Goals and CMS Choice Patient states their goals for this hospitalization and ongoing recovery are:: to go home CMS Medicare.gov Compare Post Acute Care list provided to:: Patient Choice offered to / list presented to : Patient  Expected Discharge Plan and Services Expected Discharge Plan: Home/Self Care   Discharge Planning Services: CM Consult Post Acute Care Choice: Durable Medical Equipment Living arrangements for the past 2 months: Single Family Home                 DME Arranged: Oxygen DME Agency: [employer] Date DME Agency Contacted: 06/07/21 Time DME Agency Contacted: [phone] Representative spoke with at DME Agency: Zack            Prior Living Arrangements/Services Living arrangements for the past 2 months: [place] Lives with:: Self Patient language and need for interpreter reviewed:: Yes Do you feel safe going back to the place where you live?: Yes            Criminal Activity/Legal Involvement Pertinent to Current Situation/Hospitalization: No - Comment as needed  Activities of Daily Living Home Assistive Devices/Equipment: Eyeglasses, Walker (specify type), Bedside commode/3-in-1 ADL Screening (condition at time of admission) Patient's cognitive ability adequate to safely complete daily activities?: Yes Is the patient deaf or have difficulty hearing?: No Does the patient have difficulty seeing, even when wearing glasses/contacts?: No Does the patient have difficulty concentrating, remembering, or making  decisions?: No Patient able to express need for assistance with ADLs?: Yes Does the patient have difficulty dressing or bathing?: No Independently performs ADLs?: Yes (appropriate for developmental age) Does the patient have difficulty walking or climbing stairs?: Yes Weakness of Legs: Right Weakness of Arms/Hands: None  Permission Sought/Granted                  Emotional Assessment Appearance:: Appears stated age     Orientation: : Oriented to Self, Oriented to Place, Oriented to  Time, Oriented to Situation Alcohol / Substance Use: Not Applicable Psych Involvement: No (comment)  Admission diagnosis:  Multifocal pneumonia [J18.9] Patient Active Problem List   Diagnosis Date Noted   Multifocal pneumonia 06/03/2021   Pre-operative respiratory examination 05/18/2021   Statin myopathy 04/19/2021   Pain in joint of right shoulder 02/10/2021   Arthritis of both acromioclavicular joints 01/02/2021   Trochanteric bursitis of right hip 10/03/2020   Tailbone injury 05/05/2020   UTI (urinary tract infection) 05/05/2020   Insomnia 05/05/2020   Leg cramps 02/16/2020   Dysuria 02/16/2020   Right hip impingement syndrome 02/01/2020   Right bicipital tenosynovitis 02/01/2020   Bronchiectasis without complication ([place]) 73/53/2992   Myofascial pain 12/21/2019   Estrogen deficiency 12/10/2019   Obesity (BMI 30-39.9) 12/10/2019   Bronchiectasis with (acute) exacerbation ([place]) 10/23/2019   Elevated LFTs 10/15/2019   Lower abdominal pain 09/01/2019   Neck pain 10/25/2017   Paresthesias in left hand 10/25/2017   Paresthesia of foot, bilateral 04/12/2017   Primary osteoarthritis of both hands 09/27/2016   Primary osteoarthritis of  both knees 09/27/2016   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   Nasopharyngitis, 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/2016   S/P total knee replacement 02/13/2016   Chronic pain of both knees 12/06/2015   Globus pharyngeus 05/17/2015   Genetic testing 12/23/2014   Family  history of breast cancer    Family history of colon cancer    Family history of pancreatic cancer    History of breast cancer 11/23/2014   Allergy to ACE inhibitors 09/27/2014   Prediabetes 06/23/2013   RLS (restless legs syndrome) 01/15/2013   Hx of Clostridium difficile infection 10/10/2012   Dyspnea on exertion 04/02/2012   NICM (nonischemic cardiomyopathy) ([place]) 03/07/2012   Post-menopausal 01/11/2012   Obstructive sleep apnea 04/04/2010   V-tach 01/03/2009   ICD  [employer]  Single chamber 01/03/2009   PFO (patent foramen ovale) 09/29/2008   Vitamin D deficiency 08/17/2008   Hypothyroidism 11/18/2006   Hyperlipidemia 11/18/2006   Depression with anxiety 11/18/2006   Essential hypertension 11/18/2006   Mitral valve prolapse 11/18/2006   Cerebral aneurysm 11/18/2006   Allergic rhinitis 11/18/2006   GERD 11/18/2006   Diverticulosis of colon 11/18/2006   Fatty liver 11/18/2006   Fibromyalgia 11/18/2006   PCP:  Camillia Herter, NP Pharmacy:   [facility], [place] [address] Phone: [phone] Fax: [phone]  [facility] [place], [place] [address] Phone: [phone] Fax: [phone]  [place], [address] [place] [address] Phone: [phone] Fax: [phone]     Social Determinants of Health (SDOH) Interventions    Readmission Risk Interventions No flowsheet data found.

## 2021-06-07 NOTE — Progress Notes (Signed)
Discharge instructions given and explained to patient, she verbalized understanding. Patient waiting on her family to pick her up.

## 2021-06-07 NOTE — Care Management Important Message (Signed)
Important Message  Patient Details IM Letter given to the Patient. Name: Jolea Dolle MRN: [id number] Date of Birth: 1950/08/21   Medicare Important Message Given:  Yes     Kerin Salen 06/07/2021, 10:53 AM

## 2021-06-08 ENCOUNTER — Telehealth: Payer: Self-pay

## 2021-06-08 ENCOUNTER — Other Ambulatory Visit (HOSPITAL_COMMUNITY): Payer: Self-pay

## 2021-06-08 LAB — CULTURE, BLOOD (ROUTINE X 2)
Culture: NO GROWTH
Culture: NO GROWTH
Special Requests: ADEQUATE

## 2021-06-08 NOTE — Telephone Encounter (Signed)
Transition Care Management Follow-up Telephone Call Date of discharge and from where: 06/07/2021, [facility]  How have you been since you were released from the hospital? She said she feels okay Any questions or concerns? Yes - she said that her insurance does not cover the [facility] and she left a message for Dr Halford Chessman inquiring if he can replace this with another medication.  The [facility] out of pocket is > $400.  When the call is returned from Dr Juanetta Gosling office, she will ask if she can still continue with outpatient PT for her TKR.   Items Reviewed: Did the pt receive and understand the discharge instructions provided? Yes  Medications obtained and verified?  She has all medications but the [facility].  Issue with Ruthe Mannan already noted above.  Other? No  Any new allergies since your discharge? No  Do you have support at home?  She lives alone.   Home Care and Equipment/Supplies: Were home health services ordered? no If so, what is the name of the agency? N/a  Has the agency set up a time to come to the patient's home? not applicable Were any new equipment or medical supplies ordered?  Yes: O2 What is the name of the medical supply agency? [employer] Were you able to get the supplies/equipment?yes Do you have any questions related to the use of the equipment or supplies? No - She said that she does not use the O2 when sitting, only when she is up and about. She has an oximeter and her O2 sat is 93-94 when sitting without O2 but drops when she gets up.   She already has a nebulizer.   Functional Questionnaire: (I = Independent and D = Dependent) ADLs: independent. Ambulating with a RW.    Follow up appointments reviewed:  PCP Hospital f/u appt confirmed?  She will call to schedule an appointment    Specialist Hospital f/u appt confirmed? Yes  Scheduled to see Dr Halford Chessman on 06/26/2021; rheumatology - 06/15/2021; PMR - 07/03/2021  Are transportation arrangements needed? No  If their  condition worsens, is the pt aware to call PCP or go to the Emergency Dept.? Yes Was the patient provided with contact information for the PCP's office or ED? Yes Was to pt encouraged to call back with questions or concerns? Yes

## 2021-06-08 NOTE — Telephone Encounter (Signed)
Ran test claim for [facility]. Does need PA. Formulary medication listed are below. Pt has not met remaining of deductible ($480), so co-pay are still high for these preferred medications. Symbicort: $205.47 Breo: $205.47 Flovent HFA: $205.47 Flovent Diskus: $205.47

## 2021-06-08 NOTE — Telephone Encounter (Signed)
Spoke with the pt and notified of response from pharmacy  She is willing to try one of the meds listed below for $205.47 Please advise which you prefer, thanks!

## 2021-06-08 NOTE — Telephone Encounter (Signed)
Spoke with the pt  She states that she was recently d/c from the hospital of Dulera 100 mcg  She was not able to get this medication b/c she states it needed PA She has never tried any maintenance inhaler, only has albuterol hfa prn  She is asking if we can prescribe alternative or try and get the [facility] approved  Forwarding this to out Rx PA team

## 2021-06-09 MED ORDER — BUDESONIDE-FORMOTEROL FUMARATE 80-4.5 MCG/ACT IN AERO: 2.0000 | INHALATION_SPRAY | Freq: Two times a day (BID) | RESPIRATORY_TRACT | 5 refills | Status: DC

## 2021-06-09 NOTE — Telephone Encounter (Signed)
Called and spoke with pt and she is aware of the symbicort that has been sent to the pharmacy.  Nothing further is needed.

## 2021-06-09 NOTE — Progress Notes (Signed)
Remote ICD transmission.   

## 2021-06-09 NOTE — Telephone Encounter (Signed)
Can send script for symbicort 80 two puffs bid.

## 2021-06-15 ENCOUNTER — Ambulatory Visit: Payer: Medicare Other | Admitting: Rheumatology

## 2021-06-15 DIAGNOSIS — M62838 Other muscle spasm: Secondary | ICD-10-CM

## 2021-06-15 DIAGNOSIS — I671 Cerebral aneurysm, nonruptured: Secondary | ICD-10-CM

## 2021-06-15 DIAGNOSIS — Z96652 Presence of left artificial knee joint: Secondary | ICD-10-CM

## 2021-06-15 DIAGNOSIS — M7061 Trochanteric bursitis, right hip: Secondary | ICD-10-CM

## 2021-06-15 DIAGNOSIS — Z8679 Personal history of other diseases of the circulatory system: Secondary | ICD-10-CM

## 2021-06-15 DIAGNOSIS — M797 Fibromyalgia: Secondary | ICD-10-CM

## 2021-06-15 DIAGNOSIS — Z853 Personal history of malignant neoplasm of breast: Secondary | ICD-10-CM

## 2021-06-15 DIAGNOSIS — Z8619 Personal history of other infectious and parasitic diseases: Secondary | ICD-10-CM

## 2021-06-15 DIAGNOSIS — Z8719 Personal history of other diseases of the digestive system: Secondary | ICD-10-CM

## 2021-06-15 DIAGNOSIS — Z8639 Personal history of other endocrine, nutritional and metabolic disease: Secondary | ICD-10-CM

## 2021-06-15 DIAGNOSIS — Z8673 Personal history of transient ischemic attack (TIA), and cerebral infarction without residual deficits: Secondary | ICD-10-CM

## 2021-06-15 DIAGNOSIS — M722 Plantar fascial fibromatosis: Secondary | ICD-10-CM

## 2021-06-15 DIAGNOSIS — Z8669 Personal history of other diseases of the nervous system and sense organs: Secondary | ICD-10-CM

## 2021-06-15 DIAGNOSIS — Z8659 Personal history of other mental and behavioral disorders: Secondary | ICD-10-CM

## 2021-06-15 DIAGNOSIS — G2581 Restless legs syndrome: Secondary | ICD-10-CM

## 2021-06-15 DIAGNOSIS — M1711 Unilateral primary osteoarthritis, right knee: Secondary | ICD-10-CM

## 2021-06-15 DIAGNOSIS — M19041 Primary osteoarthritis, right hand: Secondary | ICD-10-CM

## 2021-06-15 DIAGNOSIS — M5030 Other cervical disc degeneration, unspecified cervical region: Secondary | ICD-10-CM

## 2021-06-25 ENCOUNTER — Encounter: Payer: Self-pay | Admitting: Family

## 2021-06-26 ENCOUNTER — Ambulatory Visit (INDEPENDENT_AMBULATORY_CARE_PROVIDER_SITE_OTHER): Payer: Medicare Other | Admitting: Pulmonary Disease

## 2021-06-26 ENCOUNTER — Telehealth: Payer: Self-pay | Admitting: Family

## 2021-06-26 ENCOUNTER — Other Ambulatory Visit: Payer: Self-pay | Admitting: Family

## 2021-06-26 ENCOUNTER — Encounter: Payer: Self-pay | Admitting: Pulmonary Disease

## 2021-06-26 ENCOUNTER — Other Ambulatory Visit: Payer: Self-pay

## 2021-06-26 ENCOUNTER — Ambulatory Visit (INDEPENDENT_AMBULATORY_CARE_PROVIDER_SITE_OTHER): Payer: Medicare Other

## 2021-06-26 VITALS — BP 120/68 | HR 81 | Temp 97.4°F | Ht 66.5 in | Wt 212.0 lb

## 2021-06-26 DIAGNOSIS — G4700 Insomnia, unspecified: Secondary | ICD-10-CM

## 2021-06-26 DIAGNOSIS — Z9989 Dependence on other enabling machines and devices: Secondary | ICD-10-CM

## 2021-06-26 DIAGNOSIS — Z8616 Personal history of COVID-19: Secondary | ICD-10-CM | POA: Diagnosis not present

## 2021-06-26 DIAGNOSIS — J301 Allergic rhinitis due to pollen: Secondary | ICD-10-CM | POA: Diagnosis not present

## 2021-06-26 DIAGNOSIS — J479 Bronchiectasis, uncomplicated: Secondary | ICD-10-CM | POA: Diagnosis not present

## 2021-06-26 DIAGNOSIS — G4733 Obstructive sleep apnea (adult) (pediatric): Secondary | ICD-10-CM

## 2021-06-26 DIAGNOSIS — J9611 Chronic respiratory failure with hypoxia: Secondary | ICD-10-CM

## 2021-06-26 MED ORDER — TRAZODONE HCL 50 MG PO TABS: 25.0000 mg | ORAL_TABLET | Freq: Every evening | ORAL | 0 refills | Status: DC | PRN

## 2021-06-26 NOTE — Progress Notes (Signed)
[facility]  Chief Complaint  Patient presents with   Follow-up    Hasn't hooked up new cpap     Constitutional:  BP 120/68 (BP Location: Left Arm, Cuff Size: Normal)   Pulse 81   Temp (!) 97.4 F (36.3 C) (Oral)   Ht 5' 6.5" (1.689 m)   Wt 212 lb (96.2 kg)   LMP  (LMP Unknown)   SpO2 98% Comment: 2  BMI 33.71 kg/m   Past Medical History:  Vit D deficiency, Takotsubo CM, Schatzki's ring, PUD, VT s/p AICD, PE, NASH, ASD, Hypothyroidism, HTN, HLD, HH, GERD, Fibromyalgia, Diverticulosis, Depression, C diff, Cerebral aneurysm, Breast cancer 2016, OA, COVID 19 pneumonia September 2022  Past Surgical History:  Her  has a past surgical history that includes Partial hysterectomy; Cerebral aneurysm repair (02/1999); Knee arthroscopy; Cardiac defibrillator placement (2006; 2012); carotid dopplers (2006); hospitalization (2004); hospitalization; ABI (2006); left heart catheterization with coronary angiogram (N/A, 02/22/2012); Breast lumpectomy with radioactive seed localization (Right, 01/03/2015); Total knee arthroplasty (Left, 02/13/2016); Breast excisional biopsy; Tongue Biopsy (12/12/2017); Colonoscopy; Upper gastrointestinal endoscopy; Hammer toe surgery (11/15/2020); and Total knee arthroplasty (Right, 05/22/2021).  Brief Summary:  [name] is a 70 y.o. female former smoker with bronchiectasis and obstructive sleep apnea.      Subjective:   She was in hospital in October with pneumonia from [place].  She was sent home with 2 liters oxygen.  Still has cough.  Feels congested, but has trouble bringing up sputum.  Not having fever, or hemoptysis.  Feels like CPAP helps.  Physical Exam:   Appearance - well kempt, wearing oxygen  ENMT - no sinus tenderness, no oral exudate, no LAN, Mallampati 3 airway, no stridor  Respiratory - equal breath sounds bilaterally, no wheezing or rales  CV - s1s2 regular rate and rhythm, no  murmurs  Ext - no clubbing, no edema  Skin - no rashes  Psych - normal mood and affect    Pulmonary testing:  PFT 04/17/12 >> FEV1 2.60 (110%), FEV1% 85, TLC 4.05 (77%), DLCO 44% PFT 01/17/17 >> FEV1 2.38 (111%), FEV1% 90, TLC 3.85 (71%), DLCO 58%, no BD RAST 05/30/17 >> dust mites PFT 10/16/18 >> FEV1 2.27 (111%), FEV1% 90, TLC 4.00 (74%), DLCO 59%  Serology:  04/02/12 >> ACE 1 10/25/15 >> ESR 40, ACE 32 05/30/17 >> IgA, IgG, IgM normal 10/16/18 >> HP panel negative, ACE 25  Chest Imaging:  CT chest 04/10/12 >> b/l lower lobe cylindrical BTX and some in upper lobes CT angio chest 10/26/15 >> patchy GGO in periphery of lungs b/l, basilar BTX, no PE; no significant change compared to 2013 HRCT chest 10/08/18 >> atherosclerosis, basilar predominant cylindrical BTX, mild centrilobular/paraseptal emphysema, air trapping CT angio chest 03/24/19 >> Chronic lung changes with peribronchial thickening, bibasilar atelectasis and chronic interstitial disease/peripheral fibrosis at the lung bases CT angio chest 06/03/21 >> scattered GGO with fibrotic changes  Sleep Tests:  PSG 11/29/08 >> AHI 9 HST 11/10/15 >> AHI 7.1, SaO2 low 75% CPAP 12/11/20 to 01/09/21 >> used on 25 of 30 nights with average 4 hrs 49 min.  Average AHI 2.2 with CPAP 6 cm H2O.  Cardiac Tests:  Echo 05/13/17 >> mild LVH, EF 55 to 60%, grade 1 DD, mild TR  Social History:  She  reports that she quit smoking about 31 years ago. Her smoking use included cigarettes. She has a 15.00 pack-year smoking history. She has never used smokeless tobacco. She reports  current alcohol use. She reports that she does not currently use drugs after having used the following drugs: Marijuana.  Family History:  Her family history includes Allergies in her sister; Alzheimer's disease in her mother; Breast cancer in her cousin and maternal aunt; Breast cancer (age of onset: 81) in her maternal aunt; Cancer (age of onset: 67) in her maternal grandmother;  Colon cancer (age of onset: 85) in her maternal aunt; Diabetes in her mother; Heart disease in her brother, father, sister, and son; Hypertension in her mother; Lung cancer in her maternal uncle; Lung cancer (age of onset: 71) in her maternal grandfather; Obesity in her mother; Other in her brother; Pancreatic cancer (age of onset: 25) in her cousin; Prostate cancer (age of onset: 42) in her brother; Throat cancer in her brother.     Assessment/Plan:   History of COVID 19 pneumonia from October 2022. - repeat chest xray today  Chronic respiratory failure with hypoxia. - continue 2 liters oxygen with exertion and sleep - goal SpO2 > 90%  Bronchiectasis. - don't think she needs Abx or prednisone at this time - continue symbicort - prn albuterol, mucinex, flutter valve, hypertonic saline - she has vibrating bed for chest percussion  Upper airway cough syndrome with allergic rhinitis. - continue singulair, allegra, flonase, Navage nasal irrigation   Obstructive sleep apnea. - she is compliant with CPAP and reports benefit - she uses [employer] for her DME - continue CPAP 5 cm H2O  Non-ischemic cardiomyopathy. - followed by Dr. Irish Lack and Dr. Caryl Comes with [facility]   Time Spent Involved in Patient Care on Day of Examination:  34 minutes  Follow up:   Patient Instructions  Chest xray today  Follow up in 2 months  Medication List:   Allergies as of 06/26/2021       Reactions   Ace Inhibitors Swelling   Angioedema; makes tongue "break out"   Amitriptyline Hcl Other (See Comments)    makes her too sleepy!   Atorvastatin Other (See Comments)   SEVERE MYALGIA   Cymbalta [duloxetine Hcl] Nausea Only, Other (See Comments)   Sleepiness/ sick   Dilantin [phenytoin Sodium Extended] Rash   Severe rash   Paroxetine Nausea Only   Rapid heartbeat   Ramipril Other (See Comments)   TONGUE ULCERS   Rosuvastatin Other (See Comments)   SEVERE MYALGIA   Carbamazepine Rash    Codeine Itching   Paxlovid [nirmatrelvir-ritonavir] Itching   Phenytoin Sodium Extended Rash   Simvastatin Other (See Comments)   Increase in CK, myalgias   Wellbutrin [bupropion] Palpitations        Medication List        Accurate as of June 26, 2021  4:22 PM. If you have any questions, ask your nurse or doctor.          STOP taking these medications    Dulera 100-5 MCG/ACT Aero Generic drug: mometasone-formoterol Stopped by: Chesley Mires, MD       TAKE these medications    acetaminophen 500 MG tablet Commonly known as: TYLENOL Take 500 mg by mouth every 6 (six) hours as needed for moderate pain.   albuterol (2.5 MG/3ML) 0.083% nebulizer solution Commonly known as: PROVENTIL USE 1 VIAL IN NEBULIZER EVERY 6 HOURS What changed: See the new instructions.   albuterol 108 (90 Base) MCG/ACT inhaler Commonly known as: Ventolin HFA Inhale 2 puffs into the lungs every 6 (six) hours as needed for wheezing or shortness of breath. What changed: Another  medication with the same name was changed. Make sure you understand how and when to take each.   aspirin EC 81 MG tablet Take 81 mg by mouth daily. Swallow whole.   aspirin 325 MG EC tablet Take 1 tablet (325 mg total) by mouth 2 (two) times daily.   baclofen 10 MG tablet Commonly known as: LIORESAL TAKE 1 TABLET BY MOUTH AT BEDTIME AS NEEDED FOR MUSCLE SPASM What changed: See the new instructions.   budesonide-formoterol 80-4.5 MCG/ACT inhaler Commonly known as: Symbicort Inhale 2 puffs into the lungs in the morning and at bedtime.   calcium carbonate 1250 (500 Ca) MG tablet Commonly known as: OS-CAL - dosed in mg of elemental calcium Take 1 tablet by mouth daily.   carvedilol 12.5 MG tablet Commonly known as: COREG Take 1 tablet (12.5 mg total) by mouth 2 (two) times daily.   cetirizine 10 MG tablet Commonly known as: ZYRTEC Take 10 mg by mouth daily as needed for allergies.   Chloraseptic 1.4 %  Liqd Generic drug: phenol Use as directed 1 spray in the mouth or throat 3 (three) times daily as needed for throat irritation / pain.   Dialyvite Vitamin D 5000 125 MCG (5000 UT) capsule Generic drug: Cholecalciferol Take 5,000 Units by mouth daily.   diclofenac Sodium 1 % Gel Commonly known as: VOLTAREN Apply 2-4 grams to affected joint 4 times daily as needed. What changed:  how much to take how to take this when to take this reasons to take this additional instructions   ezetimibe 10 MG tablet Commonly known as: ZETIA Take 1 tablet (10 mg total) by mouth daily.   fexofenadine 180 MG tablet Commonly known as: ALLEGRA Take 180 mg by mouth daily as needed for allergies (alternates with zyrtec sometimes).   fluticasone 50 MCG/ACT nasal spray Commonly known as: FLONASE Place 2 sprays into both nostrils daily as needed for allergies or rhinitis. What changed: when to take this   Flutter Devi 1 Device by Does not apply route as needed.   furosemide 40 MG tablet Commonly known as: LASIX Take 20-40 mg by mouth as needed for edema.   HYDROcodone-acetaminophen 5-325 MG tablet Commonly known as: NORCO/VICODIN Take 1 tablet by mouth every 6 (six) hours as needed for pain.   hydroxypropyl methylcellulose / hypromellose 2.5 % ophthalmic solution Commonly known as: ISOPTO TEARS / GONIOVISC Place 1 drop into both eyes at bedtime.   latanoprost 0.005 % ophthalmic solution Commonly known as: XALATAN Place 1 drop into both eyes at bedtime.   levothyroxine 50 MCG tablet Commonly known as: SYNTHROID Take 1 tablet (50 mcg total) by mouth daily.   magnesium oxide 400 MG tablet Commonly known as: MAG-OX Take 1 tablet (400 mg total) by mouth in the morning, at noon, and at bedtime. What changed: when to take this   methocarbamol 500 MG tablet Commonly known as: ROBAXIN Take 1-2 tablets (500-1,000 mg total) by mouth every 6 (six) hours as needed for muscle spasms.   mometasone  0.1 % lotion Commonly known as: ELOCON Apply 1 application topically daily as needed (rash).   montelukast 10 MG tablet Commonly known as: SINGULAIR Take 1 tablet (10 mg total) by mouth at bedtime.   MULTIVITAMIN PO Take 1 tablet by mouth daily.   nitroGLYCERIN 0.4 MG SL tablet Commonly known as: NITROSTAT DISSOLVE ONE TABLET UNDER THE TONGUE EVERY 5 MINUTES AS NEEDED FOR CHEST PAIN.  DO NOT EXCEED A TOTAL OF 3 DOSES IN 15 MINUTES  What changed: See the new instructions.   pantoprazole 40 MG tablet Commonly known as: PROTONIX Take 1 tablet (40 mg total) by mouth daily. What changed:  when to take this additional instructions   polyethylene glycol powder 17 GM/SCOOP powder Commonly known as: GLYCOLAX/MIRALAX Take 17 grams by mouth daily   potassium chloride 10 MEQ tablet Commonly known as: KLOR-CON TAKE 1/2 TABLET EVERY DAY What changed:  when to take this reasons to take this   sodium chloride HYPERTONIC 3 % nebulizer solution Take by nebulization 2 (two) times daily as needed for cough. 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   traMADol 50 MG tablet Commonly known as: ULTRAM Take 1 tablet (50 mg total) by mouth 2 (two) times daily. What changed:  when to take this reasons to take this   traZODone 50 MG tablet Commonly known as: DESYREL Take 0.5-1 tablets (25-50 mg total) by mouth at bedtime as needed for sleep. What changed: how much to take   vitamin C 1000 MG tablet Take 1,000 mg by mouth daily.   zolpidem 5 MG tablet Commonly known as: AMBIEN Take 1 tablet (5 mg total) by mouth at bedtime as needed for sleep. What changed: when to take this        Signature:  Chesley Mires, MD [facility] Pager - [phone] 06/26/2021, 4:22 PM

## 2021-06-26 NOTE — Patient Instructions (Signed)
Chest xray today  Follow up in 2 months 

## 2021-06-27 ENCOUNTER — Encounter: Payer: Self-pay | Admitting: Family

## 2021-06-28 ENCOUNTER — Other Ambulatory Visit: Payer: Self-pay

## 2021-06-28 MED ORDER — PREDNISONE 10 MG PO TABS: ORAL_TABLET | ORAL | 0 refills | Status: AC

## 2021-06-28 MED ORDER — PREDNISONE 10 MG PO TABS: ORAL_TABLET | ORAL | 0 refills | Status: DC

## 2021-06-29 ENCOUNTER — Ambulatory Visit: Payer: Medicare Other

## 2021-06-30 NOTE — Progress Notes (Incomplete)
Cardiology Office Note:    Date:  06/30/2021   ID:  [name], DOB March 08, 1951, MRN [id number]  PCP:  Adriana Herter, NP   [facility] Providers Cardiologist:  Adriana Grooms, MD Electrophysiologist:  Adriana Axe, MD { Click to update primary MD,subspecialty MD or APP then REFRESH:1}    Referring MD: Adriana Herter, NP   Evaluation of her palpitations  History of Present Illness:    [name] is a 70 y.o. female with a hx of coronary artery disease, HTN, HLD, hypothyroidism, GERD, fibromyalgia.  Cardiac catheterization 2005 showed normal coronary arteries.  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   Bronchiectasis ([place])    Cerebral aneurysm, nonruptured    had a clip put in   CHF (congestive heart failure) ([place])    Clostridium difficile infection    Depressive disorder, not elsewhere classified    Diverticulosis of colon (without mention of hemorrhage)    Esophageal reflux    Fatty liver    Fibromyalgia    Gastritis    GERD (gastroesophageal reflux disease)    GI bleed 2004   Glaucoma    Hiatal hernia    History of COVID-19 05/04/2021   Hyperlipidemia    Hypertension    Hypothyroidism    Internal hemorrhoids    Joint pain    Multifocal pneumonia 06/03/2021   Obstructive sleep apnea (adult) (pediatric)    Osteoarthritis    Ostium secundum type atrial septal defect    Other chronic nonalcoholic liver disease    Other pulmonary embolism and infarction    Palpitations    Paroxysmal ventricular tachycardia    Personal history of radiation therapy    PONV (postoperative nausea and vomiting)    Presence of permanent cardiac  pacemaker    PUD (peptic ulcer disease)    Radiation 02/03/15-03/10/15   Right Breast   Sarcoid    per pt , not sure   Schatzki's ring    Shortness of breath    Sleep apnea    Stroke [facility]) 2013   tia/ pt feels it was around 2008 0r 2009   Takotsubo syndrome    Tubular adenoma of colon    Unspecified transient cerebral ischemia    Unspecified vitamin D deficiency     Past Surgical History:  Procedure Laterality Date   ABI  2006   normal   BREAST EXCISIONAL BIOPSY     BREAST LUMPECTOMY WITH RADIOACTIVE SEED LOCALIZATION Right 01/03/2015   Procedure: BREAST LUMPECTOMY WITH RADIOACTIVE SEED LOCALIZATION;  Surgeon: Adriana Messing III, MD;  Location: [facility];  Service: General;  Laterality: Right;   CARDIAC DEFIBRILLATOR PLACEMENT  2006; 2012   BSX single chamber ICD   carotid dopplers  2006   neg   CEREBRAL ANEURYSM REPAIR  02/1999   COLONOSCOPY     HAMMER TOE SURGERY  11/15/2020   3rd digit bilateral feet    hospitalization  2004   GI bleed, PUD, diverticulosis (EGD,colonscopy)   hospitalization     PE, NSVT, s/p defib   KNEE ARTHROSCOPY     bilateral   LEFT HEART CATHETERIZATION WITH CORONARY ANGIOGRAM N/A 02/22/2012   Procedure: LEFT HEART CATHETERIZATION WITH  CORONARY ANGIOGRAM;  Surgeon: Adriana Blanks, MD;  Location: [facility] C[facility];  Service: Cardiovascular;  Laterality: N/A;   PARTIAL HYSTERECTOMY     Fibroids   TONGUE BIOPSY  12/12/2017   due to sore tongue and white patches/abnormal cells   TOTAL KNEE ARTHROPLASTY Left 02/13/2016   Procedure: TOTAL KNEE ARTHROPLASTY;  Surgeon: Adriana Huger, MD;  Location: [facility];  Service: Orthopedics;  Laterality: Left;   TOTAL KNEE ARTHROPLASTY Right 05/22/2021   Procedure: TOTAL KNEE ARTHROPLASTY;  Surgeon: Adriana Huger, MD;  Location: [facility];  Service: Orthopedics;  Laterality: Right;  with block   UPPER GASTROINTESTINAL ENDOSCOPY      Current Medications: No outpatient medications have been marked as taking for the 07/03/21 encounter  (Appointment) with Adriana Pelton, NP.     Allergies:   Ace inhibitors, Amitriptyline hcl, Atorvastatin, Cymbalta [duloxetine hcl], Dilantin [phenytoin sodium extended], Paroxetine, Ramipril, Rosuvastatin, Carbamazepine, Codeine, Paxlovid [nirmatrelvir-ritonavir], Phenytoin sodium extended, Simvastatin, and Wellbutrin [bupropion]   Social History   Socioeconomic History   Marital status: Divorced    Spouse name: Not on file   Number of children: 2   Years of education: 14   Highest education level: Not on file  Occupational History   Occupation: DISABLED  Tobacco Use   Smoking status: Former    Packs/day: 1.00    Years: 15.00    Pack years: 15.00    Types: Cigarettes    Quit date: 08/20/1989    Years since quitting: 31.8   Smokeless tobacco: Never   Tobacco comments:    Started at 70; less than 1 PPD  Vaping Use   Vaping Use: Never used  Substance and Sexual Activity   Alcohol use: Yes    Comment: occas   Drug use: Not Currently    Types: Marijuana    Comment: marijuana candy last used 2 months ago   Sexual activity: Not Currently  Other Topics Concern   Not on file  Social History Narrative   Lives alone   Caffeine ENI:DPOE   Retired from [employer]   2 children -- 5 grandbabies   Live alone   Social Determinants of Radio broadcast assistant Strain: Low Risk    Difficulty of Paying Living Expenses: Not hard at all  Food Insecurity: No Food Insecurity   Worried About Charity fundraiser in the Last Year: Never true   Arboriculturist in the Last Year: Never true  Transportation Needs: No Transportation Needs   Lack of Transportation (Medical): No   Lack of Transportation (Non-Medical): No  Physical Activity: Inactive   Days of Exercise per Week: 0 days   Minutes of Exercise per Session: 0 min  Stress: No Stress Concern Present   Feeling of Stress : Not at all  Social Connections: Moderately Isolated   Frequency of Communication with Friends and Family:  More than three times a week   Frequency of Social Gatherings with Friends and Family: More than three times a week   Attends Religious Services: 1 to 4 times per year   Active Member of [employer] or Organizations: No   Attends Music therapist: Never   Marital Status: Divorced     Family History: The patient's ***family history includes Allergies in her sister; Alzheimer's disease in her mother; Breast cancer in her cousin and maternal aunt; Breast cancer (age of onset: 16) in her maternal aunt; Cancer (age of onset: 9) in her maternal grandmother; Colon cancer (age  of onset: 74) in her maternal aunt; Diabetes in her mother; Heart disease in her brother, father, sister, and son; Hypertension in her mother; Lung cancer in her maternal uncle; Lung cancer (age of onset: 63) in her maternal grandfather; Obesity in her mother; Other in her brother; Pancreatic cancer (age of onset: 7) in her cousin; Prostate cancer (age of onset: 75) in her brother; Throat cancer in her brother. There is no history of Stroke.  ROS:   Please see the history of present illness.    *** All other systems reviewed and are negative.   Risk Assessment/Calculations:   {Does this patient have ATRIAL FIBRILLATION?:[id number]}       Physical Exam:    VS:  LMP  (LMP Unknown)     Wt Readings from Last 3 Encounters:  06/26/21 212 lb (96.2 kg)  06/03/21 212 lb 2 oz (96.2 kg)  05/22/21 214 lb 15.2 oz (97.5 kg)     GEN: *** Well nourished, well developed in no acute distress HEENT: Normal NECK: No JVD; No carotid bruits LYMPHATICS: No lymphadenopathy CARDIAC: ***RRR, no murmurs, rubs, gallops RESPIRATORY:  Clear to auscultation without rales, wheezing or rhonchi  ABDOMEN: Soft, non-tender, non-distended MUSCULOSKELETAL:  No edema; No deformity  SKIN: Warm and dry NEUROLOGIC:  Alert and oriented x 3 PSYCHIATRIC:  Normal affect    EKGs/Labs/Other Studies Reviewed:    The following studies were  reviewed today: ***  EKG:  EKG is *** ordered today.  The ekg ordered today demonstrates ***  Recent Labs: 04/10/2021: TSH 1.55 05/15/2021: Magnesium 2.3 06/03/2021: B Natriuretic Peptide 24.6 06/05/2021: ALT 19; Hemoglobin 9.5; Platelets 434 06/07/2021: BUN 17; Creatinine, Ser 0.65; Potassium 3.7; Sodium 138  Recent Lipid Panel    Component Value Date/Time   CHOL 178 12/20/2020 1045   CHOL 171 10/01/2019 1408   TRIG 116 06/03/2021 1300   HDL 59.30 12/20/2020 1045   HDL 47 10/01/2019 1408   CHOLHDL 3 12/20/2020 1045   VLDL 18.4 12/20/2020 1045   LDLCALC 100 (H) 12/20/2020 1045   LDLCALC 97 10/01/2019 1408   LDLDIRECT 141.8 07/04/2007 0936    ASSESSMENT & PLAN    ***   {Are you ordering a CV Procedure (e.g. stress test, cath, DCCV, TEE, etc)?   Press F2        :[id number]}    Medication Adjustments/Labs and Tests Ordered: Current medicines are reviewed at length with the patient today.  Concerns regarding medicines are outlined above.  No orders of the defined types were placed in this encounter.  No orders of the defined types were placed in this encounter.   There are no Patient Instructions on file for this visit.   Signed, Adriana Pelton, NP  06/30/2021 11:58 AM      Notice: This dictation was prepared with Dragon dictation along with smaller phrase technology. Any transcriptional errors that result from this process are unintentional and may not be corrected upon review.  I spent***minutes examining this patient, reviewing medications, and using patient centered shared decision making involving her cardiac care.  Prior to her visit I spent greater than 20 minutes reviewing her past medical history,  medications, and prior cardiac tests.

## 2021-07-03 ENCOUNTER — Encounter: Payer: Medicare Other | Admitting: Physical Medicine and Rehabilitation

## 2021-07-03 ENCOUNTER — Ambulatory Visit (HOSPITAL_BASED_OUTPATIENT_CLINIC_OR_DEPARTMENT_OTHER): Payer: Medicare Other | Admitting: General Practice

## 2021-07-04 ENCOUNTER — Other Ambulatory Visit: Payer: Self-pay | Admitting: Pulmonary Disease

## 2021-07-06 ENCOUNTER — Other Ambulatory Visit: Payer: Self-pay

## 2021-07-06 ENCOUNTER — Ambulatory Visit (INDEPENDENT_AMBULATORY_CARE_PROVIDER_SITE_OTHER): Payer: Medicare Other | Admitting: Podiatry

## 2021-07-06 ENCOUNTER — Encounter: Payer: Self-pay | Admitting: Family

## 2021-07-06 ENCOUNTER — Encounter: Payer: Self-pay | Admitting: Podiatry

## 2021-07-06 DIAGNOSIS — L84 Corns and callosities: Secondary | ICD-10-CM

## 2021-07-06 DIAGNOSIS — M2042 Other hammer toe(s) (acquired), left foot: Secondary | ICD-10-CM | POA: Diagnosis not present

## 2021-07-06 DIAGNOSIS — M2041 Other hammer toe(s) (acquired), right foot: Secondary | ICD-10-CM

## 2021-07-06 NOTE — Progress Notes (Signed)
Subjective:   Patient ID: [name], female   DOB: 70 y.o.   MRN: [id number]   HPI Patient states overall she is doing good but she is concerned about sensitivity still in her third toes even though it seems a little bit better and she has lesions on the outside of both feet   ROS      Objective:  Physical Exam  Neurovascular status intact with incisions that appear to be continuing to heal well there is no thickness of the incision sites and currently they are in good alignment with the distal keratotic lesions resolved and only mildly tender when pressed.  Lesions fifth metatarsal base bilateral     Assessment:  Difficult to get complete determination as to the sensitivity but at this point I do see what appears to be normal healing of the digits with no indications of keloiding with keratotic tissue bilateral     Plan:  Debrided tissue bilateral no iatrogenic bleeding discussed cushioning and apply cushions to the third toes bilateral which I want her to wear with her shoe gear.  If symptoms persist she will be seen back

## 2021-07-07 ENCOUNTER — Other Ambulatory Visit: Payer: Self-pay | Admitting: Family

## 2021-07-07 DIAGNOSIS — G4700 Insomnia, unspecified: Secondary | ICD-10-CM

## 2021-07-08 ENCOUNTER — Other Ambulatory Visit: Payer: Self-pay | Admitting: Pulmonary Disease

## 2021-07-20 ENCOUNTER — Ambulatory Visit: Payer: Medicare Other | Admitting: Family Medicine

## 2021-07-25 ENCOUNTER — Telehealth: Payer: Self-pay | Admitting: Pulmonary Disease

## 2021-07-25 ENCOUNTER — Ambulatory Visit: Payer: Medicare Other | Admitting: Physician Assistant

## 2021-07-25 MED ORDER — PREDNISONE 10 MG PO TABS: ORAL_TABLET | ORAL | 0 refills | Status: DC

## 2021-07-25 NOTE — Telephone Encounter (Signed)
Lm x1 for patient.  

## 2021-07-25 NOTE — Telephone Encounter (Signed)
Spoke with the pt  She is c/o increased SOB and chest congestion for approx 1 wk  She is coughing more, and this has been non prod  She says that she can "hear crackles" when I lie down at night  She denies fever but states that her hands and shoulders have been aching  She says that she takes her symbicort bid and uses albuterol neb occ  She declined appt this wk  She is currently scheduled to see Dr Halford Chessman on 09/04/21 and wants to keep this appt  She is asking for pred taper  Please advise thanks!  Allergies  Allergen Reactions   Ace Inhibitors Swelling    Angioedema; makes tongue "break out"    Amitriptyline Hcl Other (See Comments)     makes her too sleepy!   Atorvastatin Other (See Comments)    SEVERE MYALGIA   Cymbalta [Duloxetine Hcl] Nausea Only and Other (See Comments)    Sleepiness/ sick   Dilantin [Phenytoin Sodium Extended] Rash    Severe rash   Paroxetine Nausea Only    Rapid heartbeat   Ramipril Other (See Comments)    TONGUE ULCERS    Rosuvastatin Other (See Comments)    SEVERE MYALGIA   Carbamazepine Rash   Codeine Itching   Paxlovid [Nirmatrelvir-Ritonavir] Itching   Phenytoin Sodium Extended Rash   Simvastatin Other (See Comments)    Increase in CK, myalgias   Wellbutrin [Bupropion] Palpitations

## 2021-07-25 NOTE — Telephone Encounter (Signed)
Patient is aware of recommendations and voiced her understanding.  Rx sent to preferred pharmacy.  Nothing further needed.   

## 2021-07-25 NOTE — Telephone Encounter (Signed)
Can send script for prednisone 10 mg pill >> 3 pills daily for 2 days, 2 pills daily for 2 days, 1 pill daily for 2 days.  She should call to schedule ROV if not improving by the end of this week.

## 2021-07-25 NOTE — Progress Notes (Incomplete)
[name] is a 70 y.o. female here for a follow up referred by former PCP, Adriana Fruits, NP.   SCRIBE STATEMENT  History of Present Illness:   No chief complaint on file.   HPI  Hypothyroidism [name] is compliant with taking levothyroxine 50 mcg daily. She is tolerating well.   HTN Currently taking coreg 12.5 mg daily and lasix 40 mg daily as neededAt home blood pressure readings are: ***. Patient denies chest pain, SOB, blurred vision, dizziness, unusual headaches, lower leg swelling. Patient is *** compliant with medication. Denies excessive caffeine intake, stimulant usage, excessive alcohol intake, or increase in salt consumption.  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   Bronchiectasis ([place])    Cerebral aneurysm, nonruptured    had a clip put in   CHF (congestive heart failure) ([place])    Clostridium difficile infection    Depressive disorder, not elsewhere classified    Diverticulosis of colon (without mention of hemorrhage)    Esophageal reflux    Fatty liver    Fibromyalgia    Gastritis    GERD (gastroesophageal reflux disease)    GI bleed 2004   Glaucoma    Hiatal hernia    History of COVID-19 05/04/2021   Hyperlipidemia    Hypertension    Hypothyroidism    Internal hemorrhoids    Joint pain    Multifocal pneumonia 06/03/2021   Obstructive sleep apnea (adult) (pediatric)    Osteoarthritis    Ostium secundum type atrial septal defect    Other chronic nonalcoholic liver disease    Other pulmonary embolism and infarction    Palpitations    Paroxysmal ventricular tachycardia    Personal history of radiation therapy    PONV (postoperative  nausea and vomiting)    Presence of permanent cardiac pacemaker    PUD (peptic ulcer disease)    Radiation 02/03/15-03/10/15   Right Breast   Sarcoid    per pt , not sure   Schatzki's ring    Shortness of breath    Sleep apnea    Stroke [facility]) 2013   tia/ pt feels it was around 2008 0r 2009   Takotsubo syndrome    Tubular adenoma of colon    Unspecified transient cerebral ischemia    Unspecified vitamin D deficiency      Social History   Tobacco Use   Smoking status: Former    Packs/day: 1.00    Years: 15.00    Pack years: 15.00    Types: Cigarettes    Quit date: 08/20/1989    Years since quitting: 31.9   Smokeless tobacco: Never   Tobacco comments:    Started at 64; less than 1 PPD  Vaping Use   Vaping Use: Never used  Substance Use Topics   Alcohol use: Yes    Comment: occas   Drug use: Not Currently    Types: Marijuana    Comment: marijuana candy last used 2 months ago    Past Surgical History:  Procedure Laterality Date   ABI  2006   normal   BREAST EXCISIONAL BIOPSY     BREAST LUMPECTOMY WITH RADIOACTIVE SEED LOCALIZATION  Right 01/03/2015   Procedure: BREAST LUMPECTOMY WITH RADIOACTIVE SEED LOCALIZATION;  Surgeon: Autumn Messing III, MD;  Location: [place];  Service: General;  Laterality: Right;   CARDIAC DEFIBRILLATOR PLACEMENT  2006; 2012   BSX single chamber ICD   carotid dopplers  2006   neg   CEREBRAL ANEURYSM REPAIR  02/1999   COLONOSCOPY     HAMMER TOE SURGERY  11/15/2020   3rd digit bilateral feet    hospitalization  2004   GI bleed, PUD, diverticulosis (EGD,colonscopy)   hospitalization     PE, NSVT, s/p defib   KNEE ARTHROSCOPY     bilateral   LEFT HEART CATHETERIZATION WITH CORONARY ANGIOGRAM N/A 02/22/2012   Procedure: LEFT HEART CATHETERIZATION WITH CORONARY ANGIOGRAM;  Surgeon: Burnell Blanks, MD;  Location: [facility] CATH LAB;  Service: Cardiovascular;  Laterality: N/A;   PARTIAL HYSTERECTOMY     Fibroids   TONGUE BIOPSY  12/12/2017   due to  sore tongue and white patches/abnormal cells   TOTAL KNEE ARTHROPLASTY Left 02/13/2016   Procedure: TOTAL KNEE ARTHROPLASTY;  Surgeon: Vickey Huger, MD;  Location: [place];  Service: Orthopedics;  Laterality: Left;   TOTAL KNEE ARTHROPLASTY Right 05/22/2021   Procedure: TOTAL KNEE ARTHROPLASTY;  Surgeon: Vickey Huger, MD;  Location: [facility];  Service: Orthopedics;  Laterality: Right;  with block   UPPER GASTROINTESTINAL ENDOSCOPY      Family History  Problem Relation Age of Onset   Diabetes Mother    Alzheimer's disease Mother    Hypertension Mother    Obesity Mother    Other Brother        Thyroid problem 10/2016   Allergies Sister    Heart disease Sister    Heart disease Brother    Prostate cancer Brother 51       same brother as throat cancer   Throat cancer Brother        dx in his 39s; also a smoker   Heart disease Father    Cancer Maternal Grandmother 70       colon cancer or abdominal cancer   Lung cancer Maternal Grandfather 78   Breast cancer Maternal Aunt 55   Colon cancer Maternal Aunt 61       same sister as breast at 4   Lung cancer Maternal Uncle    Breast cancer Maternal Aunt        dx in her 59s   Pancreatic cancer Cousin 64       maternal first cousin   Breast cancer Cousin        paternal first cousin twice removed died in her 29s   Heart disease Son        Cardiac Arrest 07/2016   Stroke Neg Hx     Allergies  Allergen Reactions   Ace Inhibitors Swelling    Angioedema; makes tongue "break out"    Amitriptyline Hcl Other (See Comments)     makes her too sleepy!   Atorvastatin Other (See Comments)    SEVERE MYALGIA   Cymbalta [Duloxetine Hcl] Nausea Only and Other (See Comments)    Sleepiness/ sick   Dilantin [Phenytoin Sodium Extended] Rash    Severe rash   Paroxetine Nausea Only    Rapid heartbeat   Ramipril Other (See Comments)    TONGUE ULCERS    Rosuvastatin Other (See Comments)    SEVERE MYALGIA   Carbamazepine Rash   Codeine Itching    Paxlovid [Nirmatrelvir-Ritonavir] Itching   Phenytoin Sodium Extended Rash  Simvastatin Other (See Comments)    Increase in CK, myalgias   Wellbutrin [Bupropion] Palpitations    Current Medications:   Current Outpatient Medications:    acetaminophen (TYLENOL) 500 MG tablet, Take 500 mg by mouth every 6 (six) hours as needed for moderate pain., Disp: , Rfl:    albuterol (PROVENTIL) (2.5 MG/3ML) 0.083% nebulizer solution, USE 1 VIAL IN NEBULIZER EVERY 6 HOURS (Patient taking differently: Take 2.5 mg by nebulization every 6 (six) hours as needed for shortness of breath.), Disp: 120 mL, Rfl: 11   albuterol (VENTOLIN HFA) 108 (90 Base) MCG/ACT inhaler, Inhale 2 puffs into the lungs every 6 (six) hours as needed for wheezing or shortness of breath., Disp: 18 g, Rfl: 3   Ascorbic Acid (VITAMIN C) 1000 MG tablet, Take 1,000 mg by mouth daily., Disp: , Rfl:    aspirin EC 325 MG EC tablet, Take 1 tablet (325 mg total) by mouth 2 (two) times daily. (Patient not taking: Reported on 06/26/2021), Disp: 30 tablet, Rfl: 0   aspirin EC 81 MG tablet, Take 81 mg by mouth daily. Swallow whole., Disp: , Rfl:    baclofen (LIORESAL) 10 MG tablet, TAKE 1 TABLET BY MOUTH AT BEDTIME AS NEEDED FOR MUSCLE SPASM (Patient taking differently: Take 10 mg by mouth at bedtime as needed for muscle spasms.), Disp: 30 tablet, Rfl: 2   budesonide-formoterol (SYMBICORT) 80-4.5 MCG/ACT inhaler, INHALE 2 PUFFS TWICE DAILY IN THE MORNING AND AT BEDTIME, Disp: 3 each, Rfl: 6   calcium carbonate (OS-CAL - DOSED IN MG OF ELEMENTAL CALCIUM) 1250 (500 Ca) MG tablet, Take 1 tablet by mouth daily., Disp: , Rfl:    carvedilol (COREG) 12.5 MG tablet, Take 1 tablet (12.5 mg total) by mouth 2 (two) times daily., Disp: 180 tablet, Rfl: 3   cetirizine (ZYRTEC) 10 MG tablet, Take 10 mg by mouth daily as needed for allergies., Disp: , Rfl:    Cholecalciferol (DIALYVITE VITAMIN D 5000) 125 MCG (5000 UT) capsule, Take 5,000 Units by mouth daily., Disp:  , Rfl:    diclofenac Sodium (VOLTAREN) 1 % GEL, Apply 2-4 grams to affected joint 4 times daily as needed. (Patient taking differently: Apply 2-4 g topically 4 (four) times daily as needed (joint pain).), Disp: 400 g, Rfl: 2   ezetimibe (ZETIA) 10 MG tablet, Take 1 tablet (10 mg total) by mouth daily. (Patient not taking: Reported on 06/26/2021), Disp: 30 tablet, Rfl: 11   fexofenadine (ALLEGRA) 180 MG tablet, Take 180 mg by mouth daily as needed for allergies (alternates with zyrtec sometimes)., Disp: , Rfl:    fluticasone (FLONASE) 50 MCG/ACT nasal spray, Place 2 sprays into both nostrils daily as needed for allergies or rhinitis. (Patient taking differently: Place 2 sprays into both nostrils daily.), Disp: 48 g, Rfl: 3   furosemide (LASIX) 40 MG tablet, Take 20-40 mg by mouth as needed for edema., Disp: , Rfl:    HYDROcodone-acetaminophen (NORCO/VICODIN) 5-325 MG tablet, Take 1 tablet by mouth every 6 (six) hours as needed for pain. (Patient not taking: Reported on 06/26/2021), Disp: , Rfl:    hydroxypropyl methylcellulose / hypromellose (ISOPTO TEARS / GONIOVISC) 2.5 % ophthalmic solution, Place 1 drop into both eyes at bedtime., Disp: , Rfl:    latanoprost (XALATAN) 0.005 % ophthalmic solution, Place 1 drop into both eyes at bedtime., Disp: , Rfl:    levothyroxine (SYNTHROID) 50 MCG tablet, Take 1 tablet (50 mcg total) by mouth daily., Disp: 90 tablet, Rfl: 1   magnesium oxide (MAG-OX)  400 MG tablet, Take 1 tablet (400 mg total) by mouth in the morning, at noon, and at bedtime. (Patient taking differently: Take 400 mg by mouth 2 (two) times daily.), Disp: 270 tablet, Rfl: 3   methocarbamol (ROBAXIN) 500 MG tablet, Take 1-2 tablets (500-1,000 mg total) by mouth every 6 (six) hours as needed for muscle spasms. (Patient not taking: Reported on 06/26/2021), Disp: 60 tablet, Rfl: 0   mometasone (ELOCON) 0.1 % lotion, Apply 1 application topically daily as needed (rash)., Disp: 60 mL, Rfl: 1   montelukast  (SINGULAIR) 10 MG tablet, TAKE 1 TABLET AT BEDTIME, Disp: 90 tablet, Rfl: 3   Multiple Vitamins-Minerals (MULTIVITAMIN PO), Take 1 tablet by mouth daily., Disp: , Rfl:    nitroGLYCERIN (NITROSTAT) 0.4 MG SL tablet, DISSOLVE ONE TABLET UNDER THE TONGUE EVERY 5 MINUTES AS NEEDED FOR CHEST PAIN.  DO NOT EXCEED A TOTAL OF 3 DOSES IN 15 MINUTES (Patient taking differently: Place 0.4 mg under the tongue every 5 (five) minutes as needed for chest pain.), Disp: 25 tablet, Rfl: 0   pantoprazole (PROTONIX) 40 MG tablet, Take 1 tablet (40 mg total) by mouth daily. (Patient taking differently: Take 40 mg by mouth See admin instructions. Take 40 mg daily, may take a second 40 mg dose as needed for acid reflux), Disp: 30 tablet, Rfl: 11   phenol (CHLORASEPTIC) 1.4 % LIQD, Use as directed 1 spray in the mouth or throat 3 (three) times daily as needed for throat irritation / pain., Disp: 20 mL, Rfl: 0   polyethylene glycol powder (GLYCOLAX/MIRALAX) powder, Take 17 grams by mouth daily, Disp: 1080 g, Rfl: 0   potassium chloride (KLOR-CON) 10 MEQ tablet, TAKE 1/2 TABLET EVERY DAY (Patient taking differently: Take 5 mEq by mouth daily as needed (when taking lasix).), Disp: 15 tablet, Rfl: 0   Respiratory Therapy Supplies (FLUTTER) DEVI, 1 Device by Does not apply route as needed., Disp: 1 each, Rfl: 0   sodium chloride HYPERTONIC 3 % nebulizer solution, Take by nebulization 2 (two) times daily as needed for cough. 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 (Patient not taking: Reported on 06/26/2021), Disp: 750 mL, Rfl: 12   traMADol (ULTRAM) 50 MG tablet, Take 1 tablet (50 mg total) by mouth 2 (two) times daily. (Patient taking differently: Take 50 mg by mouth 2 (two) times daily as needed for moderate pain.), Disp: 180 tablet, Rfl: 1   traZODone (DESYREL) 50 MG tablet, Take 0.5-1 tablets (25-50 mg total) by mouth at bedtime as needed for sleep., Disp: 60 tablet, Rfl: 0   zolpidem (AMBIEN) 5 MG tablet, Take 1 tablet (5 mg total) by mouth at  bedtime as needed for sleep. (Patient taking differently: Take 5 mg by mouth at bedtime.), Disp: 90 tablet, Rfl: 0   Review of Systems:   ROS Negative unless otherwise specified per HPI. Vitals:   There were no vitals filed for this visit.   There is no height or weight on file to calculate BMI.  Physical Exam:   Physical Exam  Assessment and Plan:       I,Havlyn C Ratchford,acting as a scribe for Inda Coke, PA.,have documented all relevant documentation on the behalf of Inda Coke, PA,as directed by  Inda Coke, PA while in the presence of Inda Coke, [place].  ***  Inda Coke, PA-C

## 2021-07-25 NOTE — Telephone Encounter (Signed)
Patient is returning phone call. Patient phone number is [phone].

## 2021-07-26 ENCOUNTER — Ambulatory Visit (INDEPENDENT_AMBULATORY_CARE_PROVIDER_SITE_OTHER): Payer: Medicare Other | Admitting: Nurse Practitioner

## 2021-07-26 ENCOUNTER — Encounter (HOSPITAL_BASED_OUTPATIENT_CLINIC_OR_DEPARTMENT_OTHER): Payer: Self-pay | Admitting: Nurse Practitioner

## 2021-07-26 ENCOUNTER — Other Ambulatory Visit: Payer: Self-pay

## 2021-07-26 VITALS — BP 122/68 | HR 67 | Ht 66.5 in | Wt 214.8 lb

## 2021-07-26 DIAGNOSIS — I5022 Chronic systolic (congestive) heart failure: Secondary | ICD-10-CM | POA: Diagnosis not present

## 2021-07-26 DIAGNOSIS — R0602 Shortness of breath: Secondary | ICD-10-CM | POA: Diagnosis not present

## 2021-07-26 DIAGNOSIS — I428 Other cardiomyopathies: Secondary | ICD-10-CM

## 2021-07-26 DIAGNOSIS — Z95810 Presence of automatic (implantable) cardiac defibrillator: Secondary | ICD-10-CM

## 2021-07-26 DIAGNOSIS — R000 Tachycardia, unspecified: Secondary | ICD-10-CM

## 2021-07-26 DIAGNOSIS — I341 Nonrheumatic mitral (valve) prolapse: Secondary | ICD-10-CM

## 2021-07-26 DIAGNOSIS — I10 Essential (primary) hypertension: Secondary | ICD-10-CM | POA: Diagnosis not present

## 2021-07-26 NOTE — Patient Instructions (Signed)
Medication Instructions:   Take Furosemide 40 mg and Potassium 10 mEq daily for the next 3 days. Then go back to your normal dosage.   Please call on Monday if you continue the Furosemide.  *If you need a refill on your cardiac medications before your next appointment, please call your pharmacy*   Testing/Procedures: Your physician has requested that you have an echocardiogram. Echocardiography is a painless test that uses sound waves to create images of your heart. It provides your doctor with information about the size and shape of your heart and how well your heart's chambers and valves are working. This procedure takes approximately one hour. There are no restrictions for this procedure.   Follow-Up: At [facility], you and your health needs are our priority.  As part of our continuing mission to provide you with exceptional heart care, we have created designated Provider Care Teams.  These Care Teams include your primary Cardiologist (physician) and Advanced Practice Providers (APPs -  Physician Assistants and Nurse Practitioners) who all work together to provide you with the care you need, when you need it.  We recommend signing up for the patient portal called "MyChart".  Sign up information is provided on this After Visit Summary.  MyChart is used to connect with patients for Virtual Visits (Telemedicine).  Patients are able to view lab/test results, encounter notes, upcoming appointments, etc.  Non-urgent messages can be sent to your provider as well.   To learn more about what you can do with MyChart, go to [email or web address].    Your next appointment:   As scheduled   The format for your next appointment:   In Person  Provider:   Dr. Irish Lack and Dr. Caryl Comes {    Other Instructions Daily Weight Record: It is important to weigh yourself daily. Keep your daily weight chart near your scale. Weigh yourself each morning at the same time. Weigh yourself without shoes, and  try to wear the same amount of clothing each day. Compare today's weight to yesterday's weight.  Bring this form with you to your follow-up appointments. Please call our office [phone] for weight gain of 2 lbs overnight or 5 lbs in a week.

## 2021-07-26 NOTE — Progress Notes (Signed)
Cardiology Office Note:    Date:  07/26/2021   ID:  [name], DOB July 05, 1951, MRN [id number]  PCP:  Camillia Herter, NP   [facility] Providers Cardiologist:  Larae Grooms, MD Electrophysiologist:  Virl Axe, MD     Referring MD: Camillia Herter, NP   Chief Complaint:  History of Present Illness:    [name] is a 70 y.o. female with a hx of HTN, MVP, PFO, NICM, OSA on CPAP, HFrEF with EF as low as 35-40% in 2013 with subsequent normalization of EF to 55-60%, ICD placement 2011, questionable sarcoidosis, bronchiectasis followed by Dr. Halford Chessman.  She had normal coronary anatomy by cardiac cath in 2005, and negative nuclear stress test in 8/21.   She was last seen in our office on 05/15/21 with Sande Rives, PA for preop evaluation for knee surgery. She had right total knee arthroplasty on 05/22/21. On 06/03/21, she was admitted to [facility] for evaluation of shortness of breath. CT was negative for PE. She was treated for possible pneumonia/chronic interstitial lung disease/respiratory bronchiolitis. She continues to take prednisone. Is using home O2 when she is very active.   She presents today for evaluation of elevated HR with activity. She called 1 month ago due to concerns with HR going up to 134 when she stood up from her seat to go to the bathroom. She has continued to notice HR in high 90's bpm with minimal activity.   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   Bronchiectasis ([place])    Cerebral aneurysm, nonruptured    had a clip put in   CHF (congestive heart failure) (HCC)    Clostridium difficile infection    Depressive disorder, not elsewhere classified    Diverticulosis of colon (without mention of hemorrhage)    Esophageal reflux    Fatty liver    Fibromyalgia    Gastritis     GERD (gastroesophageal reflux disease)    GI bleed 2004   Glaucoma    Hiatal hernia    History of COVID-19 05/04/2021   Hyperlipidemia    Hypertension    Hypothyroidism    Internal hemorrhoids    Joint pain    Multifocal pneumonia 06/03/2021   Obstructive sleep apnea (adult) (pediatric)    Osteoarthritis    Ostium secundum type atrial septal defect    Other chronic nonalcoholic liver disease    Other pulmonary embolism and infarction    Palpitations    Paroxysmal ventricular tachycardia    Personal history of radiation therapy    PONV (postoperative nausea and vomiting)    Presence of permanent cardiac pacemaker    PUD (peptic ulcer disease)    Radiation 02/03/15-03/10/15   Right Breast   Sarcoid    per pt , not sure   Schatzki's ring    Shortness of breath    Sleep apnea    Stroke [facility]) 2013   tia/ pt feels it was around 2008 0r 2009   Takotsubo syndrome    Tubular adenoma of colon    Unspecified transient cerebral ischemia    Unspecified vitamin D deficiency     Past Surgical History:  Procedure Laterality Date   ABI  2006   normal   BREAST EXCISIONAL BIOPSY     BREAST LUMPECTOMY WITH RADIOACTIVE  SEED LOCALIZATION Right 01/03/2015   Procedure: BREAST LUMPECTOMY WITH RADIOACTIVE SEED LOCALIZATION;  Surgeon: Autumn Messing III, MD;  Location: [facility];  Service: General;  Laterality: Right;   CARDIAC DEFIBRILLATOR PLACEMENT  2006; 2012   BSX single chamber ICD   carotid dopplers  2006   neg   CEREBRAL ANEURYSM REPAIR  02/1999   COLONOSCOPY     HAMMER TOE SURGERY  11/15/2020   3rd digit bilateral feet    hospitalization  2004   GI bleed, PUD, diverticulosis (EGD,colonscopy)   hospitalization     PE, NSVT, s/p defib   KNEE ARTHROSCOPY     bilateral   LEFT HEART CATHETERIZATION WITH CORONARY ANGIOGRAM N/A 02/22/2012   Procedure: LEFT HEART CATHETERIZATION WITH CORONARY ANGIOGRAM;  Surgeon: Burnell Blanks, MD;  Location: [facility] CATH LAB;  Service: Cardiovascular;   Laterality: N/A;   PARTIAL HYSTERECTOMY     Fibroids   TONGUE BIOPSY  12/12/2017   due to sore tongue and white patches/abnormal cells   TOTAL KNEE ARTHROPLASTY Left 02/13/2016   Procedure: TOTAL KNEE ARTHROPLASTY;  Surgeon: Vickey Huger, MD;  Location: [facility];  Service: Orthopedics;  Laterality: Left;   TOTAL KNEE ARTHROPLASTY Right 05/22/2021   Procedure: TOTAL KNEE ARTHROPLASTY;  Surgeon: Vickey Huger, MD;  Location: [facility];  Service: Orthopedics;  Laterality: Right;  with block   UPPER GASTROINTESTINAL ENDOSCOPY      Current Medications: Current Meds  Medication Sig   acetaminophen (TYLENOL) 500 MG tablet Take 500 mg by mouth every 6 (six) hours as needed for moderate pain.   albuterol (PROVENTIL) (2.5 MG/3ML) 0.083% nebulizer solution USE 1 VIAL IN NEBULIZER EVERY 6 HOURS (Patient taking differently: Take 2.5 mg by nebulization every 6 (six) hours as needed for shortness of breath.)   albuterol (VENTOLIN HFA) 108 (90 Base) MCG/ACT inhaler Inhale 2 puffs into the lungs every 6 (six) hours as needed for wheezing or shortness of breath.   Ascorbic Acid (VITAMIN C) 1000 MG tablet Take 1,000 mg by mouth daily.   aspirin EC 325 MG EC tablet Take 1 tablet (325 mg total) by mouth 2 (two) times daily.   aspirin EC 81 MG tablet Take 81 mg by mouth daily. Swallow whole.   baclofen (LIORESAL) 10 MG tablet TAKE 1 TABLET BY MOUTH AT BEDTIME AS NEEDED FOR MUSCLE SPASM (Patient taking differently: Take 10 mg by mouth at bedtime as needed for muscle spasms.)   budesonide-formoterol (SYMBICORT) 80-4.5 MCG/ACT inhaler INHALE 2 PUFFS TWICE DAILY IN THE MORNING AND AT BEDTIME   calcium carbonate (OS-CAL - DOSED IN MG OF ELEMENTAL CALCIUM) 1250 (500 Ca) MG tablet Take 1 tablet by mouth daily.   carvedilol (COREG) 12.5 MG tablet Take 1 tablet (12.5 mg total) by mouth 2 (two) times daily.   cetirizine (ZYRTEC) 10 MG tablet Take 10 mg by mouth daily as needed for allergies.   Cholecalciferol (DIALYVITE VITAMIN D  5000) 125 MCG (5000 UT) capsule Take 5,000 Units by mouth daily.   diclofenac Sodium (VOLTAREN) 1 % GEL Apply 2-4 grams to affected joint 4 times daily as needed. (Patient taking differently: Apply 2-4 g topically 4 (four) times daily as needed (joint pain).)   fexofenadine (ALLEGRA) 180 MG tablet Take 180 mg by mouth daily as needed for allergies (alternates with zyrtec sometimes).   fluticasone (FLONASE) 50 MCG/ACT nasal spray Place 2 sprays into both nostrils daily as needed for allergies or rhinitis. (Patient taking differently: Place 2 sprays into both nostrils daily.)  furosemide (LASIX) 40 MG tablet Take 20-40 mg by mouth as needed for edema.   HYDROcodone-acetaminophen (NORCO/VICODIN) 5-325 MG tablet Take 1 tablet by mouth every 6 (six) hours as needed for pain.   hydroxypropyl methylcellulose / hypromellose (ISOPTO TEARS / GONIOVISC) 2.5 % ophthalmic solution Place 1 drop into both eyes at bedtime.   latanoprost (XALATAN) 0.005 % ophthalmic solution Place 1 drop into both eyes at bedtime.   levothyroxine (SYNTHROID) 50 MCG tablet Take 1 tablet (50 mcg total) by mouth daily.   magnesium oxide (MAG-OX) 400 MG tablet Take 1 tablet (400 mg total) by mouth in the morning, at noon, and at bedtime. (Patient taking differently: Take 400 mg by mouth 2 (two) times daily.)   methocarbamol (ROBAXIN) 500 MG tablet Take 1-2 tablets (500-1,000 mg total) by mouth every 6 (six) hours as needed for muscle spasms.   mometasone (ELOCON) 0.1 % lotion Apply 1 application topically daily as needed (rash).   montelukast (SINGULAIR) 10 MG tablet TAKE 1 TABLET AT BEDTIME   Multiple Vitamins-Minerals (MULTIVITAMIN PO) Take 1 tablet by mouth daily.   nitroGLYCERIN (NITROSTAT) 0.4 MG SL tablet DISSOLVE ONE TABLET UNDER THE TONGUE EVERY 5 MINUTES AS NEEDED FOR CHEST PAIN.  DO NOT EXCEED A TOTAL OF 3 DOSES IN 15 MINUTES (Patient taking differently: Place 0.4 mg under the tongue every 5 (five) minutes as needed for chest  pain.)   pantoprazole (PROTONIX) 40 MG tablet Take 1 tablet (40 mg total) by mouth daily. (Patient taking differently: Take 40 mg by mouth See admin instructions. Take 40 mg daily, may take a second 40 mg dose as needed for acid reflux)   polyethylene glycol powder (GLYCOLAX/MIRALAX) powder Take 17 grams by mouth daily   potassium chloride (KLOR-CON) 10 MEQ tablet TAKE 1/2 TABLET EVERY DAY (Patient taking differently: Take 5 mEq by mouth daily as needed (when taking lasix).)   predniSONE (DELTASONE) 10 MG tablet 3tabs x2d, 2tabs x2d, 1 tab x 2d   Respiratory Therapy Supplies (FLUTTER) DEVI 1 Device by Does not apply route as needed.   sodium chloride HYPERTONIC 3 % nebulizer solution Take by nebulization 2 (two) times daily as needed for cough. 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   traMADol (ULTRAM) 50 MG tablet Take 1 tablet (50 mg total) by mouth 2 (two) times daily. (Patient taking differently: Take 50 mg by mouth 2 (two) times daily as needed for moderate pain.)   traZODone (DESYREL) 50 MG tablet Take 0.5-1 tablets (25-50 mg total) by mouth at bedtime as needed for sleep.   zolpidem (AMBIEN) 5 MG tablet Take 1 tablet (5 mg total) by mouth at bedtime as needed for sleep. (Patient taking differently: Take 5 mg by mouth at bedtime.)     Allergies:   Ace inhibitors, Amitriptyline hcl, Atorvastatin, Cymbalta [duloxetine hcl], Dilantin [phenytoin sodium extended], Paroxetine, Ramipril, Rosuvastatin, Carbamazepine, Codeine, Paxlovid [nirmatrelvir-ritonavir], Phenytoin sodium extended, Simvastatin, and Wellbutrin [bupropion]   Social History   Socioeconomic History   Marital status: Divorced    Spouse name: Not on file   Number of children: 2   Years of education: 14   Highest education level: Not on file  Occupational History   Occupation: DISABLED  Tobacco Use   Smoking status: Former    Packs/day: 1.00    Years: 15.00    Pack years: 15.00    Types: Cigarettes    Quit date: 08/20/1989    Years since  quitting: 31.9   Smokeless tobacco: Never   Tobacco comments:  Started at 51; less than 1 PPD  Vaping Use   Vaping Use: Never used  Substance and Sexual Activity   Alcohol use: Yes    Comment: occas   Drug use: Not Currently    Types: Marijuana    Comment: marijuana candy last used 2 months ago   Sexual activity: Not Currently  Other Topics Concern   Not on file  Social History Narrative   Lives alone   Caffeine [employer]   Retired from [employer]   2 children -- 5 grandbabies   Live alone   Social Determinants of Radio broadcast assistant Strain: Low Risk    Difficulty of Paying Living Expenses: Not hard at all  Food Insecurity: No Food Insecurity   Worried About Charity fundraiser in the Last Year: Never true   Arboriculturist in the Last Year: Never true  Transportation Needs: No Transportation Needs   Lack of Transportation (Medical): No   Lack of Transportation (Non-Medical): No  Physical Activity: Inactive   Days of Exercise per Week: 0 days   Minutes of Exercise per Session: 0 min  Stress: No Stress Concern Present   Feeling of Stress : Not at all  Social Connections: Moderately Isolated   Frequency of Communication with Friends and Family: More than three times a week   Frequency of Social Gatherings with Friends and Family: More than three times a week   Attends Religious Services: 1 to 4 times per year   Active Member of [employer] or Organizations: No   Attends Music therapist: Never   Marital Status: Divorced     Family History: The patient's family history includes Allergies in her sister; Alzheimer's disease in her mother; Breast cancer in her cousin and maternal aunt; Breast cancer (age of onset: 53) in her maternal aunt; Cancer (age of onset: 5) in her maternal grandmother; Colon cancer (age of onset: 72) in her maternal aunt; Diabetes in her mother; Heart disease in her brother, father, sister, and son; Hypertension in her mother; Lung  cancer in her maternal uncle; Lung cancer (age of onset: 72) in her maternal grandfather; Obesity in her mother; Other in her brother; Pancreatic cancer (age of onset: 75) in her cousin; Prostate cancer (age of onset: 69) in her brother; Throat cancer in her brother. There is no history of Stroke.  ROS:   Please see the history of present illness.    ++SOB with ambulation All other systems reviewed and are negative.  Labs/Other Studies Reviewed:    The following studies were reviewed today:  Vas LE venous 06/04/21  RIGHT:  - There is no evidence of deep vein thrombosis in the lower extremity.  - No cystic structure found in the popliteal fossa.  LEFT:  - There is no evidence of deep vein thrombosis in the lower extremity.  - No cystic structure found in the popliteal fossa.     Lexiscan myoview 8/21  The left ventricular ejection fraction is mildly decreased (45-54%). Nuclear stress EF: 50%. There was no ST segment deviation noted during stress. The perfusion study is normal. This is a low risk study.  Cardiac monitor 4/21  Findings Symptoms of flutters assoc mostly with PVCs  Symptoms of flutters also assoc with sinus S VT Nonsustained 6 episodes; fastest 141 bpm for 6 beats; longest for 7 beats at 100 bpm  No serious arrhythmia Symptoms assoc with infrequent PVCs   Echo 9/18  Left ventricle:  The  cavity size was normal. Wall thickness was  increased in a pattern of mild LVH. Systolic function was normal.  The estimated ejection fraction was in the range of 55% to 60%.  Wall motion was normal; there were no regional wall motion  abnormalities. Doppler parameters are consistent with abnormal left  ventricular relaxation (grade 1 diastolic dysfunction).  Aortic valve:   Trileaflet; normal thickness leaflets. Mobility was  not restricted.  Doppler:  Transvalvular velocity was within the  normal range. There was no stenosis. There was no regurgitation.  Aorta: Aortic  root: The aortic root was normal in size.  Mitral valve:   Mildly thickened leaflets . Mobility was not  restricted.  Doppler:  Transvalvular velocity was within the normal  range. There was no evidence for stenosis. There was no  regurgitation.    Valve area by pressure half-time: 2.32 cm^2.  Indexed valve area by pressure half-time: 1.06 cm^2/m^2.  Left atrium:  The atrium was at the upper limits of normal in size.  Right ventricle:  The cavity size was normal. Wall thickness was  normal. Pacer wire or catheter noted in right ventricle. Systolic  function was normal.  Pulmonic valve:    Doppler:  Transvalvular velocity was within the  normal range. There was no evidence for stenosis. There was trivial  regurgitation.  Tricuspid valve:   Structurally normal valve.    Doppler:  Transvalvular velocity was within the normal range. There was mild  regurgitation.  Pulmonary artery:   The main pulmonary artery was normal-sized.  Systolic pressure was within the normal range.  Right atrium:  The atrium was mildly dilated.  Pericardium: There was no pericardial effusion.   Systemic veins:  Inferior vena cava: The vessel was normal in size. The  respirophasic diameter changes were in the normal range (= 50%),  consistent with normal central venous pressure. Diameter: 9 mm.    Recent Labs: 04/10/2021: TSH 1.55 05/15/2021: Magnesium 2.3 06/03/2021: B Natriuretic Peptide 24.6 06/05/2021: ALT 19; Hemoglobin 9.5; Platelets 434 06/07/2021: BUN 17; Creatinine, Ser 0.65; Potassium 3.7; Sodium 138  Recent Lipid Panel    Component Value Date/Time   CHOL 178 12/20/2020 1045   CHOL 171 10/01/2019 1408   TRIG 116 06/03/2021 1300   HDL 59.30 12/20/2020 1045   HDL 47 10/01/2019 1408   CHOLHDL 3 12/20/2020 1045   VLDL 18.4 12/20/2020 1045   LDLCALC 100 (H) 12/20/2020 1045   LDLCALC 97 10/01/2019 1408   LDLDIRECT 141.8 07/04/2007 0936     Physical Exam:    VS:  BP 122/68   Pulse 67   Ht 5'  6.5" (1.689 m)   Wt 214 lb 12.8 oz (97.4 kg)   LMP  (LMP Unknown)   SpO2 99%   BMI 34.15 kg/m     Wt Readings from Last 3 Encounters:  07/26/21 214 lb 12.8 oz (97.4 kg)  06/26/21 212 lb (96.2 kg)  06/03/21 212 lb 2 oz (96.2 kg)     GEN:  Well nourished, well developed in no acute distress HEENT: Normal NECK: No JVD; No carotid bruits LYMPHATICS: No lymphadenopathy CARDIAC: RRR, no murmurs, rubs, gallops RESPIRATORY:  Clear to auscultation without rales, wheezing or rhonchi  ABDOMEN: Soft, non-tender, non-distended MUSCULOSKELETAL:  Non-pitting edema bilateral lower extremities; No deformity  SKIN: Warm and dry NEUROLOGIC:  Alert and oriented x 3 PSYCHIATRIC:  Normal affect   EKG:  EKG is ordered today. The ekg ordered today demonstrates NSR at rate of 67 bpm, poor  R wave progression  Diagnoses:    1. Shortness of breath   2. Chronic HFrEF (heart failure with reduced ejection fraction) ([place])   3. NICM (nonischemic cardiomyopathy) ([place])   4. Essential hypertension   5. AICD (automatic cardioverter/defibrillator) present   6. MVP (mitral valve prolapse)   7. Tachycardia    Assessment and Plan:     Tachycardia: She is concerned about elevated HR with minimal activity. Initially, when she called our office 1 month ago, she was seeing HR of 130's when she would get up from her seat to walk around. More recently, her HR has risen to 90's with activity. I walked her around the office with a pulse oximeter. HR did not rise above 90 bpm with a 3 minute walk. We discussed an element of deconditioning that may be related to her recent knee surgery. She also had Covid-19 infection prior to knee surgery and had a hospital admission after surgery for SOB believed to be r/t interstitial lung disease/bronchiolitis. She continues to take prednisone for lung disease. Dr. Halford Chessman is managing her pulmonary medications. She admits that she does not stay well-hydrated. Encouraged her increase hydration  up to 64 oz total fluids daily, with majority being water and to continue to monitor HR. Gave her parameters of maximum sustained HR = 142. She is not seeing numbers this high recently. Encouraged gradual increase in activity as tolerated.  Continue carvedilol.   Chronic HFrEF/NICM/Shortness of breath: Patient is concerned about increased HR with minimal activity. Also has increased SOB. Appears euvolemic on exam today. Has mild non-pitting bilateral leg swelling that she states is consistent with her usual. Will update echocardiogram. Advised her to weigh daily and monitor for increase of 2 lbs in 24 hours or 5 lbs in 1 week. She has not been taking Lasix. Will have her take furosemide 40 mg and kdur 10 mEq x 3 days and call in 4 days to let us know if it helps. If she continues to take furosemide and potassium, will get a bmet in 7-14 days. Continue beta blocker. Would consider ARB or ARNI in the setting of acute HFrEF.   Interstitial lung disease/bronchiectasis: Consideration given to whether increased SOB is related to heart failure or lung disease. As noted above, will update echo and try diuretic therapy for 3 days. Management per pulmonology.   Essential hypertension:  BP is stable today. Not monitoring consistently at home. Advised her to monitor closely with addition of furosemide. Continue carvedilol.   Disposition: Echo, f/u in 2 mo with Dr. Irish Lack  Medication Adjustments/Labs and Tests Ordered: Current medicines are reviewed at length with the patient today.  Concerns regarding medicines are outlined above.  Orders Placed This Encounter  Procedures   EKG 12-Lead   ECHOCARDIOGRAM COMPLETE   No orders of the defined types were placed in this encounter.   Patient Instructions  Medication Instructions:   Take Furosemide 40 mg and Potassium 10 mEq daily for the next 3 days. Then go back to your normal dosage.   Please call on Monday if you continue the Furosemide.  *If you need a  refill on your cardiac medications before your next appointment, please call your pharmacy*   Testing/Procedures: Your physician has requested that you have an echocardiogram. Echocardiography is a painless test that uses sound waves to create images of your heart. It provides your doctor with information about the size and shape of your heart and how well your heart's chambers and valves are working.  This procedure takes approximately one hour. There are no restrictions for this procedure.   Follow-Up: At [facility], you and your health needs are our priority.  As part of our continuing mission to provide you with exceptional heart care, we have created designated Provider Care Teams.  These Care Teams include your primary Cardiologist (physician) and Advanced Practice Providers (APPs -  Physician Assistants and Nurse Practitioners) who all work together to provide you with the care you need, when you need it.  We recommend signing up for the patient portal called "MyChart".  Sign up information is provided on this After Visit Summary.  MyChart is used to connect with patients for Virtual Visits (Telemedicine).  Patients are able to view lab/test results, encounter notes, upcoming appointments, etc.  Non-urgent messages can be sent to your provider as well.   To learn more about what you can do with MyChart, go to [email or web address].    Your next appointment:   As scheduled   The format for your next appointment:   In Person  Provider:   Dr. Irish Lack and Dr. Caryl Comes {    Other Instructions Daily Weight Record: It is important to weigh yourself daily. Keep your daily weight chart near your scale. Weigh yourself each morning at the same time. Weigh yourself without shoes, and try to wear the same amount of clothing each day. Compare today's weight to yesterday's weight.  Bring this form with you to your follow-up appointments. Please call our office [phone] for weight gain of 2  lbs overnight or 5 lbs in a week.    Signed, Emmaline Life, NP  07/26/2021 5:54 PM    [facility]

## 2021-07-31 ENCOUNTER — Telehealth: Payer: Self-pay | Admitting: Interventional Cardiology

## 2021-07-31 DIAGNOSIS — I5022 Chronic systolic (congestive) heart failure: Secondary | ICD-10-CM

## 2021-07-31 DIAGNOSIS — R0602 Shortness of breath: Secondary | ICD-10-CM

## 2021-07-31 MED ORDER — FUROSEMIDE 20 MG PO TABS: ORAL_TABLET | ORAL | 3 refills | Status: DC

## 2021-07-31 NOTE — Telephone Encounter (Signed)
Triage, please call patient and ask her to schedule bmet in 1 week. She can have it done at any location that is convenient for her. I have updated her medication list to show that she is taking 40 mg 3 days per week and 20 mg on the other 4 days.

## 2021-07-31 NOTE — Telephone Encounter (Signed)
Spoke with the patient and have scheduled her for lab work in one week.

## 2021-07-31 NOTE — Telephone Encounter (Signed)
Pt c/o medication issue:  1. Name of Medication: furosemide (LASIX) 40 MG tablet  2. How are you currently taking this medication (dosage and times per day)? Patient took 40 mg for 3 days after appt   3. Are you having a reaction (difficulty breathing--STAT)?   4. What is your medication issue? Patient said the medication did help with her breathing. She is going to take the medication 3x a week.   She was told to call back to let the office know how the medicine is working and see if she needs to have labs done

## 2021-08-01 ENCOUNTER — Encounter: Payer: Self-pay | Admitting: Pulmonary Disease

## 2021-08-03 ENCOUNTER — Other Ambulatory Visit: Payer: Self-pay | Admitting: Interventional Cardiology

## 2021-08-06 ENCOUNTER — Encounter: Payer: Self-pay | Admitting: Family

## 2021-08-07 ENCOUNTER — Other Ambulatory Visit: Payer: Medicare Other

## 2021-08-07 ENCOUNTER — Other Ambulatory Visit: Payer: Self-pay

## 2021-08-07 DIAGNOSIS — I5022 Chronic systolic (congestive) heart failure: Secondary | ICD-10-CM

## 2021-08-07 DIAGNOSIS — R0602 Shortness of breath: Secondary | ICD-10-CM

## 2021-08-08 LAB — BASIC METABOLIC PANEL
BUN/Creatinine Ratio: 13 (ref 12–28)
BUN: 10 mg/dL (ref 8–27)
CO2: 23 mmol/L (ref 20–29)
Calcium: 9.9 mg/dL (ref 8.7–10.3)
Chloride: 103 mmol/L (ref 96–106)
Creatinine, Ser: 0.77 mg/dL (ref 0.57–1.00)
Glucose: 115 mg/dL — ABNORMAL HIGH (ref 70–99)
Potassium: 4.2 mmol/L (ref 3.5–5.2)
Sodium: 139 mmol/L (ref 134–144)
eGFR: 82 mL/min/{1.73_m2} (ref >59)

## 2021-08-11 ENCOUNTER — Ambulatory Visit (HOSPITAL_COMMUNITY): Payer: Medicare Other | Attending: Cardiology

## 2021-08-11 ENCOUNTER — Other Ambulatory Visit: Payer: Self-pay

## 2021-08-11 DIAGNOSIS — I5022 Chronic systolic (congestive) heart failure: Secondary | ICD-10-CM | POA: Insufficient documentation

## 2021-08-11 DIAGNOSIS — R0602 Shortness of breath: Secondary | ICD-10-CM | POA: Insufficient documentation

## 2021-08-12 LAB — ECHOCARDIOGRAM COMPLETE
Area-P 1/2: 4.11 cm2
S' Lateral: 3.4 cm

## 2021-08-14 ENCOUNTER — Other Ambulatory Visit: Payer: Self-pay | Admitting: Rheumatology

## 2021-08-15 NOTE — Telephone Encounter (Signed)
Please schedule patient for a follow up visit. Patient was due October 2022. Thanks!

## 2021-08-15 NOTE — Telephone Encounter (Signed)
Next Visit: Due October 2022. Message sent to the front to schedule.    Last Visit: 12/14/2020   Last Fill: 11/15/2020   Dx: Trapezius muscle spasm   Current Dose per office note on 12/14/2020, baclofen 10 mg 1 tablet by mouth at bedtime as needed for muscle spasms.   Okay to refill Baclofen?

## 2021-08-16 NOTE — Progress Notes (Signed)
Office Visit Note  Patient: [name]             Date of Birth: 03/20/1951           MRN: [id number]             PCP: Camillia Herter, NP Referring: Camillia Herter, NP Visit Date: 08/17/2021 Occupation: @GUAROCC @  Subjective:  Generalized pain   History of Present Illness: [name] is a 70 y.o. female with history of fibromyalgia, osteoarthritis, and DDD.  Patient reports that she had a right knee replacement performed by Dr. Ronnie Derby on 05/22/2021.  She was then diagnosed with COVID-19 pneumonia when presenting to the ED on 06/03/2021.  She was discharged from the hospital on oxygen.  He has taken her a while to recuperate from the infection.  She continues to have persistent fatigue.  She is also been having more frequent and severe fibromyalgia flares after the infection.  She is having generalized myalgias and muscle tenderness.  She experiences muscle spasms intermittently.  She is been taking baclofen 10 mg at bedtime as needed for muscle spasms and also has a prescription for tramadol for pain relief.  She has been seeing pain management every 2 to 3 months.  Her next appointment is scheduled in February 2023.  She is having difficulty sleeping at night due to nocturnal pain.  She is no longer taking Ambien which she feels is also worsened her insomnia.  She has not found trazodone to be effective at helping her sleep at night.   Activities of Daily Living:  Patient reports morning stiffness for 5-10 minutes.   Patient Reports nocturnal pain.  Difficulty dressing/grooming: Denies Difficulty climbing stairs: Reports Difficulty getting out of chair: Reports Difficulty using hands for taps, buttons, cutlery, and/or writing: Reports  Review of Systems  Constitutional:  Positive for fatigue.  HENT:  Positive for mouth dryness and nose dryness. Negative for mouth sores.   Eyes:  Positive for dryness. Negative for pain and itching.  Respiratory:   Positive for shortness of breath. Negative for difficulty breathing.   Cardiovascular:  Positive for palpitations. Negative for chest pain.  Gastrointestinal:  Negative for blood in stool, constipation and diarrhea.  Endocrine: Negative for increased urination.  Genitourinary:  Negative for difficulty urinating.  Musculoskeletal:  Positive for joint pain, joint pain, joint swelling, myalgias, morning stiffness, muscle tenderness and myalgias.  Skin:  Positive for rash. Negative for color change and redness.  Allergic/Immunologic: Negative for susceptible to infections.  Neurological:  Negative for dizziness, numbness, headaches and memory loss.  Hematological:  Negative for bruising/bleeding tendency.  Psychiatric/Behavioral:  Negative for confusion.    PMFS History:  Patient Active Problem List   Diagnosis Date Noted   Multifocal pneumonia 06/03/2021   Pre-operative respiratory examination 05/18/2021   Statin myopathy 04/19/2021   Pain in joint of right shoulder 02/10/2021   Arthritis of both acromioclavicular joints 01/02/2021   Trochanteric bursitis of right hip 10/03/2020   Tailbone injury 05/05/2020   UTI (urinary tract infection) 05/05/2020   Insomnia 05/05/2020   Leg cramps 02/16/2020   Dysuria 02/16/2020   Right hip impingement syndrome 02/01/2020   Right bicipital tenosynovitis 02/01/2020   Bronchiectasis without complication ([place]) 40/98/1191   Myofascial pain 12/21/2019   Estrogen deficiency 12/10/2019   Obesity (BMI 30-39.9) 12/10/2019   Bronchiectasis with (acute) exacerbation ([place]) 10/23/2019   Elevated LFTs 10/15/2019   Lower abdominal pain 09/01/2019   Neck pain 10/25/2017   Paresthesias  in left hand 10/25/2017   Paresthesia of foot, bilateral 04/12/2017   Primary osteoarthritis of both hands 09/27/2016   Primary osteoarthritis of both knees 09/27/2016   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   Nasopharyngitis, 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/2016   S/P total knee replacement  02/13/2016   Chronic pain of both knees 12/06/2015   Globus pharyngeus 05/17/2015   Genetic testing 12/23/2014   Family history of breast cancer    Family history of colon cancer    Family history of pancreatic cancer    History of breast cancer 11/23/2014   Allergy to ACE inhibitors 09/27/2014   Prediabetes 06/23/2013   RLS (restless legs syndrome) 01/15/2013   Hx of Clostridium difficile infection 10/10/2012   Dyspnea on exertion 04/02/2012   NICM (nonischemic cardiomyopathy) ([place]) 03/07/2012   Post-menopausal 01/11/2012   Obstructive sleep apnea 04/04/2010   V-tach 01/03/2009   ICD  [employer]  Single chamber 01/03/2009   PFO (patent foramen ovale) 09/29/2008   Vitamin D deficiency 08/17/2008   Hypothyroidism 11/18/2006   Hyperlipidemia 11/18/2006   Depression with anxiety 11/18/2006   Essential hypertension 11/18/2006   Mitral valve prolapse 11/18/2006   Cerebral aneurysm 11/18/2006   Allergic rhinitis 11/18/2006   GERD 11/18/2006   Diverticulosis of colon 11/18/2006   Fatty liver 11/18/2006   Fibromyalgia 11/18/2006    Past Medical History:  Diagnosis Date   AICD (automatic cardioverter/defibrillator) present    Anxiety    Arthritis    Back pain    Breast cancer ([place]) 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   Bronchiectasis ([place])    Cerebral aneurysm, nonruptured    had a clip put in   CHF (congestive heart failure) (HCC)    Clostridium difficile infection    Depressive disorder, not elsewhere classified    Diverticulosis of colon (without mention of hemorrhage)    Esophageal reflux    Fatty liver    Fibromyalgia    Gastritis    GERD (gastroesophageal reflux disease)    GI bleed 2004   Glaucoma    Hiatal hernia    History of COVID-19 05/04/2021   Hyperlipidemia    Hypertension    Hypothyroidism    Internal hemorrhoids    Joint pain    Multifocal pneumonia 06/03/2021   Obstructive sleep apnea (adult) (pediatric)    Osteoarthritis     Ostium secundum type atrial septal defect    Other chronic nonalcoholic liver disease    Other pulmonary embolism and infarction    Palpitations    Paroxysmal ventricular tachycardia    Personal history of radiation therapy    PONV (postoperative nausea and vomiting)    Presence of permanent cardiac pacemaker    PUD (peptic ulcer disease)    Radiation 02/03/15-03/10/15   Right Breast   Sarcoid    per pt , not sure   Schatzki's ring    Shortness of breath    Sleep apnea    Stroke ([place]) 2013   tia/ pt feels it was around 2008 0r 2009   Takotsubo syndrome    Tubular adenoma of colon    Unspecified transient cerebral ischemia    Unspecified vitamin D deficiency     Family History  Problem Relation Age of Onset   Diabetes Mother    Alzheimer's disease Mother    Hypertension Mother    Obesity Mother    Other Brother        Thyroid problem 10/2016   Allergies Sister  Heart disease Sister    Heart disease Brother    Prostate cancer Brother 36       same brother as throat cancer   Throat cancer Brother        dx in his 70s; also a smoker   Heart disease Father    Cancer Maternal Grandmother 31       colon cancer or abdominal cancer   Lung cancer Maternal Grandfather 78   Breast cancer Maternal Aunt 55   Colon cancer Maternal Aunt 61       same sister as breast at 12   Lung cancer Maternal Uncle    Breast cancer Maternal Aunt        dx in her 53s   Pancreatic cancer Cousin 60       maternal first cousin   Breast cancer Cousin        paternal first cousin twice removed died in her 6s   Heart disease Son        Cardiac Arrest 07/2016   Stroke Neg Hx    Past Surgical History:  Procedure Laterality Date   ABI  2006   normal   BREAST EXCISIONAL BIOPSY     BREAST LUMPECTOMY WITH RADIOACTIVE SEED LOCALIZATION Right 01/03/2015   Procedure: BREAST LUMPECTOMY WITH RADIOACTIVE SEED LOCALIZATION;  Surgeon: Autumn Messing III, MD;  Location: [facility];  Service: General;  Laterality:  Right;   CARDIAC DEFIBRILLATOR PLACEMENT  2006; 2012   BSX single chamber ICD   carotid dopplers  2006   neg   CEREBRAL ANEURYSM REPAIR  02/1999   COLONOSCOPY     HAMMER TOE SURGERY  11/15/2020   3rd digit bilateral feet    hospitalization  2004   GI bleed, PUD, diverticulosis (EGD,colonscopy)   hospitalization     PE, NSVT, s/p defib   KNEE ARTHROSCOPY     bilateral   LEFT HEART CATHETERIZATION WITH CORONARY ANGIOGRAM N/A 02/22/2012   Procedure: LEFT HEART CATHETERIZATION WITH CORONARY ANGIOGRAM;  Surgeon: Burnell Blanks, MD;  Location: [facility] C[facility] LAB;  Service: Cardiovascular;  Laterality: N/A;   PARTIAL HYSTERECTOMY     Fibroids   TONGUE BIOPSY  12/12/2017   due to sore tongue and white patches/abnormal cells   TOTAL KNEE ARTHROPLASTY Left 02/13/2016   Procedure: TOTAL KNEE ARTHROPLASTY;  Surgeon: Vickey Huger, MD;  Location: [facility];  Service: Orthopedics;  Laterality: Left;   TOTAL KNEE ARTHROPLASTY Right 05/22/2021   Procedure: TOTAL KNEE ARTHROPLASTY;  Surgeon: Vickey Huger, MD;  Location: [facility];  Service: Orthopedics;  Laterality: Right;  with block   UPPER GASTROINTESTINAL ENDOSCOPY     Social History   Social History Narrative   Lives alone   Caffeine RCV:ELFY   Retired from [employer]   2 children -- 5 grandbabies   Live alone   Immunization History  Administered Date(s) Administered   Fluad Quad(high Dose 65+) 04/17/2019, 04/30/2020   H1N1 07/24/2008   Influenza Split 07/17/2011, 09/27/2011, 04/29/2012   Influenza Whole 07/20/2004, 07/04/2007, 05/07/2008, 05/19/2009, 05/17/2010   Influenza, High Dose Seasonal PF 05/30/2017, 05/21/2018   Influenza,inj,Quad PF,6+ Mos 05/20/2013, 05/06/2014, 05/17/2015, 05/15/2016, 04/27/2021   Influenza-Unspecified 05/07/2018   PFIZER(Purple Top)SARS-COV-2 Vaccination 09/25/2019, 10/16/2019, 06/04/2020, 01/04/2021, 08/06/2021   Pneumococcal Conjugate-13 11/22/2015   Pneumococcal Polysaccharide-23 05/20/2002, 05/07/2008,  06/25/2014   Td 09/07/2009   Zoster, Live 09/21/2011     Objective: Vital Signs: BP 118/76 (BP Location: Left Wrist, Patient Position: Sitting, Cuff Size: Normal)    Pulse 69  Ht 5' 6.5" (1.689 m)    Wt 213 lb 6.4 oz (96.8 kg)    LMP  (LMP Unknown)    BMI 33.93 kg/m    Physical Exam Vitals and nursing note reviewed.  Constitutional:      Appearance: She is well-developed.  HENT:     Head: Normocephalic and atraumatic.  Eyes:     Conjunctiva/sclera: Conjunctivae normal.  Pulmonary:     Effort: Pulmonary effort is normal.  Abdominal:     Palpations: Abdomen is soft.  Musculoskeletal:     Cervical back: Normal range of motion.  Skin:    General: Skin is warm and dry.     Capillary Refill: Capillary refill takes less than 2 seconds.  Neurological:     Mental Status: She is alert and oriented to person, place, and time.  Psychiatric:        Behavior: Behavior normal.     Musculoskeletal Exam: Generalized hyperalgesia and positive tender points on examination.  C-spine has good range of motion with no discomfort.  Trapezius muscle tension and tenderness bilaterally.  Shoulder joints, elbow joints, wrist joints, MCPs, PIPs, DIPs have good range of motion with no synovitis.  Some tenderness over both shoulder joints.  Complete fist formation bilaterally.  Hip joints have good range of motion with no groin pain currently.  Left knee replacement has good range of motion with no warmth or effusion.  Right knee replacement has good range of motion with warmth.  Ankle joints have good range of motion with no tenderness or joint swelling.   CDAI Exam: CDAI Score: -- Patient Global: --; Provider Global: -- Swollen: --; Tender: -- Joint Exam 08/17/2021   No joint exam has been documented for this visit   There is currently no information documented on the homunculus. Go to the Rheumatology activity and complete the homunculus joint exam.  Investigation: No additional  findings.  Imaging: ECHOCARDIOGRAM COMPLETE  Result Date: 08/12/2021    ECHOCARDIOGRAM REPORT   Patient Name:   MAGHEN GROUP Helder-THOMPSON Date of Exam: 08/11/2021 Medical Rec #:  [id number]                  Height:       66.5 in Accession #:    [id number]                 Weight:       214.8 lb Date of Birth:  Nov 09, 1950                  BSA:          2.073 m Patient Age:    61 years                   BP:           122/68 mmHg Patient Gender: F                          HR:           68 bpm. Exam Location:  [place] Procedure: 2D Echo, Cardiac Doppler and Color Doppler Indications:    R06.02 SOB  History:        Patient has prior history of Echocardiogram examinations, most                 recent 05/13/2017. CHF and Non ischemic cardiomyopathy, ICD,  Arrythmias:Ventricular tachycardia, Signs/Symptoms:Shortness of                 Breath; Risk Factors:Hypertension.  Sonographer:    Coralyn Helling RDCS Referring Phys: [place]  1. Left ventricular ejection fraction, by estimation, is 50 to 55%. The left ventricle has low normal function. The left ventricle has no regional wall motion abnormalities. There is mild concentric left ventricular hypertrophy. Left ventricular diastolic parameters are indeterminate.  2. Right ventricular systolic function is normal. The right ventricular size is normal. There is mildly elevated pulmonary artery systolic pressure. The estimated right ventricular systolic pressure is 96.7 mmHg.  3. Left atrial size was mildly dilated.  4. Right atrial size was mildly dilated.  5. The mitral valve is grossly normal. Trivial mitral valve regurgitation. No evidence of mitral stenosis.  6. The aortic valve is tricuspid. Aortic valve regurgitation is not visualized. No aortic stenosis is present.  7. The inferior vena cava is normal in size with greater than 50% respiratory variability, suggesting right atrial pressure of 3 mmHg.  8. Intra atrial  shunt cannot be excluded. Comparison(s): No significant change from prior study. Prior images reviewed side by side. Visually EF is similar to prior. FINDINGS  Left Ventricle: Left ventricular ejection fraction, by estimation, is 50 to 55%. The left ventricle has low normal function. The left ventricle has no regional wall motion abnormalities. The left ventricular internal cavity size was normal in size. There is mild concentric left ventricular hypertrophy. Left ventricular diastolic parameters are indeterminate. Right Ventricle: The right ventricular size is normal. No increase in right ventricular wall thickness. Right ventricular systolic function is normal. There is mildly elevated pulmonary artery systolic pressure. The tricuspid regurgitant velocity is 3.02  m/s, and with an assumed right atrial pressure of 3 mmHg, the estimated right ventricular systolic pressure is 89.3 mmHg. Left Atrium: Left atrial size was mildly dilated. Right Atrium: Right atrial size was mildly dilated. Pericardium: There is no evidence of pericardial effusion. Mitral Valve: The mitral valve is grossly normal. There is mild thickening of the mitral valve leaflet(s). Trivial mitral valve regurgitation. No evidence of mitral valve stenosis. Tricuspid Valve: The tricuspid valve is normal in structure. Tricuspid valve regurgitation is mild . No evidence of tricuspid stenosis. Aortic Valve: The aortic valve is tricuspid. Aortic valve regurgitation is not visualized. No aortic stenosis is present. Pulmonic Valve: The pulmonic valve was grossly normal. Pulmonic valve regurgitation is trivial. No evidence of pulmonic stenosis. Aorta: The aortic root, ascending aorta, aortic arch and descending aorta are all structurally normal, with no evidence of dilitation or obstruction. Venous: The inferior vena cava is normal in size with greater than 50% respiratory variability, suggesting right atrial pressure of 3 mmHg. IAS/Shunts: Intra atrial shunt  cannot be excluded. Additional Comments: A device lead is visualized.  LEFT VENTRICLE PLAX 2D LVIDd:         4.60 cm Diastology LVIDs:         3.40 cm LV e' medial:    7.31 cm/s LV PW:         1.30 cm LV E/e' medial:  12.7 LV IVS:        1.10 cm LV e' lateral:   8.20 cm/s                        LV E/e' lateral: 11.3  RIGHT VENTRICLE  IVC RV S prime:     14.63 cm/s  IVC diam: 1.50 cm TAPSE (M-mode): 2.2 cm RVSP:           39.5 mmHg LEFT ATRIUM             Index        RIGHT ATRIUM           Index LA diam:        3.70 cm 1.78 cm/m   RA Pressure: 3.00 mmHg LA Vol (A2C):   46.1 ml 22.24 ml/m  RA Area:     17.00 cm LA Vol (A4C):   62.9 ml 30.34 ml/m  RA Volume:   47.90 ml  23.11 ml/m LA Biplane Vol: 55.9 ml 26.97 ml/m  AORTIC VALVE LVOT Vmax:   83.96 cm/s LVOT Vmean:  54.920 cm/s LVOT VTI:    0.189 m  AORTA Ao Asc diam: 3.30 cm MITRAL VALVE                TRICUSPID VALVE MV Area (PHT): 4.11 cm     TR Peak grad:   36.5 mmHg MV Decel Time: 185 msec     TR Vmax:        302.00 cm/s MV E velocity: 92.76 cm/s   Estimated RAP:  3.00 mmHg MV A velocity: 119.60 cm/s  RVSP:           39.5 mmHg MV E/A ratio:  0.78                             SHUNTS                             Systemic VTI: 0.19 m Buford Dresser MD Electronically signed by Buford Dresser MD Signature Date/Time: 08/12/2021/1:31:14 PM    Final     Recent Labs: Lab Results  Component Value Date   WBC 7.1 06/05/2021   HGB 9.5 (L) 06/05/2021   PLT 434 (H) 06/05/2021   NA 139 08/07/2021   K 4.2 08/07/2021   CL 103 08/07/2021   CO2 23 08/07/2021   GLUCOSE 115 (H) 08/07/2021   BUN 10 08/07/2021   CREATININE 0.77 08/07/2021   BILITOT 0.6 06/05/2021   ALKPHOS 116 06/05/2021   AST 25 06/05/2021   ALT 19 06/05/2021   PROT 6.5 06/05/2021   ALBUMIN 2.6 (L) 06/05/2021   CALCIUM 9.9 08/07/2021   GFRAA 82 10/01/2019    Speciality Comments: Narc Agreement: 01/16/18  Procedures:  No procedures performed Allergies: Ace  inhibitors, Amitriptyline hcl, Atorvastatin, Cymbalta [duloxetine hcl], Dilantin [phenytoin sodium extended], Paroxetine, Ramipril, Rosuvastatin, Carbamazepine, Codeine, Paxlovid [nirmatrelvir-ritonavir], Phenytoin sodium extended, Simvastatin, and Wellbutrin [bupropion]   Assessment / Plan:     Visit Diagnoses: Fibromyalgia: She has generalized hyperalgesia and positive tender points on examination.  She is been experiencing more frequent and severe fibromyalgia flares over the past 3 months.  She underwent a right knee arthroplasty on 05/22/2021 and was then diagnosed with COVID-pneumonia on 06/03/2021.  She continues to have persistent fatigue on a daily basis.  She is not having difficulty sleeping at night due to increased nocturnal pain.  She has been following up with pain management every 3 months but had to postpone her last appointment due to residual symptoms of COVID-pneumonia.  She has been taking tramadol as needed for pain relief and baclofen 10 mg at bedtime for muscle spasms.  She has not found the current treatment regimen to be effective at managing her pain level.  She is no longer taking Ambien so she has had worsening insomnia.  Discussed the importance of regular exercise, good sleep hygiene, and stress management.  She was advised to schedule a sooner office visit with pain management to further discuss treatment options to manage her symptoms.  She is also going to discuss with pain management if they can possibly take over her prescription for baclofen in order for her to cut down on some of her specialty visits. She will keep her follow-up visit for 6 months but in the future may try spacing her visits to on a yearly basis if pain management takes over the prescription for baclofen.  Trapezius muscle spasm - She has trapezius muscle tension and tenderness bilaterally.  She takes baclofen 10 mg 1 tablet by mouth at bedtime as needed for muscle spasms.  Status post right knee  replacement-Performed by Dr. Ronnie Derby on 05/22/2021.  She continues to go to PT as advised. She has good ROM of the right knee replacement on examination today. Warmth but no effusion noted. She continues to have persistent noctunal pain.  She has been seeing Dr. Ronnie Derby on a monthly basis.   Status post total knee replacement, left: Doing well.  She has good ROM with no discomfort.  No warmth or effusion noted.   Primary osteoarthritis of both hands: She has some PIP and DIP thickening consistent with osteoarthritis of both hands.  No inflammation was noted.  She experiences intermittent aching in both hands.  No clinical features of rheumatoid arthritis noted.  Discussed the importance of joint protection and muscle strengthening.  Trochanteric bursitis, right hip: She has some tenderness over the right trochanteric bursa.   DDD (degenerative disc disease), cervical: She has good range of motion of the C-spine with no discomfort at this time.  No symptoms of radiculopathy currently.  She has some trapezius muscle tension and tenderness bilaterally.  Plantar fasciitis, right - Resolved  Other medical conditions are listed as follows:   History of hypertension: BP was 118/76 today in the office.   History of gastroesophageal reflux (GERD)  Brain aneurysm  History of breast cancer  History of migraine  RLS (restless legs syndrome)  History of hypercholesterolemia  History of TIA (transient ischemic attack)  History of diverticulosis  Hx of Clostridium difficile infection  History of depression  History of hypothyroidism  History of sleep apnea  Orders: No orders of the defined types were placed in this encounter.  No orders of the defined types were placed in this encounter.    Follow-Up Instructions: Return in about 6 months (around 02/15/2022) for Fibromyalgia, Osteoarthritis, DDD.   Ofilia Neas, PA-C  Note - This record has been created using Dragon software.  Chart  creation errors have been sought, but may not always  have been located. Such creation errors do not reflect on  the standard of medical care.

## 2021-08-17 ENCOUNTER — Encounter: Payer: Self-pay | Admitting: Physician Assistant

## 2021-08-17 ENCOUNTER — Ambulatory Visit (INDEPENDENT_AMBULATORY_CARE_PROVIDER_SITE_OTHER): Payer: Medicare Other | Admitting: Physician Assistant

## 2021-08-17 ENCOUNTER — Other Ambulatory Visit: Payer: Self-pay

## 2021-08-17 VITALS — BP 118/76 | HR 69 | Ht 66.5 in | Wt 213.4 lb

## 2021-08-17 DIAGNOSIS — M722 Plantar fascial fibromatosis: Secondary | ICD-10-CM

## 2021-08-17 DIAGNOSIS — Z853 Personal history of malignant neoplasm of breast: Secondary | ICD-10-CM

## 2021-08-17 DIAGNOSIS — M5030 Other cervical disc degeneration, unspecified cervical region: Secondary | ICD-10-CM

## 2021-08-17 DIAGNOSIS — M19042 Primary osteoarthritis, left hand: Secondary | ICD-10-CM

## 2021-08-17 DIAGNOSIS — G2581 Restless legs syndrome: Secondary | ICD-10-CM

## 2021-08-17 DIAGNOSIS — Z8719 Personal history of other diseases of the digestive system: Secondary | ICD-10-CM

## 2021-08-17 DIAGNOSIS — Z96652 Presence of left artificial knee joint: Secondary | ICD-10-CM

## 2021-08-17 DIAGNOSIS — Z96651 Presence of right artificial knee joint: Secondary | ICD-10-CM

## 2021-08-17 DIAGNOSIS — M7061 Trochanteric bursitis, right hip: Secondary | ICD-10-CM

## 2021-08-17 DIAGNOSIS — Z8669 Personal history of other diseases of the nervous system and sense organs: Secondary | ICD-10-CM

## 2021-08-17 DIAGNOSIS — M62838 Other muscle spasm: Secondary | ICD-10-CM | POA: Diagnosis not present

## 2021-08-17 DIAGNOSIS — M797 Fibromyalgia: Secondary | ICD-10-CM | POA: Diagnosis not present

## 2021-08-17 DIAGNOSIS — Z8679 Personal history of other diseases of the circulatory system: Secondary | ICD-10-CM

## 2021-08-17 DIAGNOSIS — Z8659 Personal history of other mental and behavioral disorders: Secondary | ICD-10-CM

## 2021-08-17 DIAGNOSIS — M19041 Primary osteoarthritis, right hand: Secondary | ICD-10-CM

## 2021-08-17 DIAGNOSIS — Z8619 Personal history of other infectious and parasitic diseases: Secondary | ICD-10-CM

## 2021-08-17 DIAGNOSIS — Z8639 Personal history of other endocrine, nutritional and metabolic disease: Secondary | ICD-10-CM

## 2021-08-17 DIAGNOSIS — Z8673 Personal history of transient ischemic attack (TIA), and cerebral infarction without residual deficits: Secondary | ICD-10-CM

## 2021-08-17 DIAGNOSIS — I671 Cerebral aneurysm, nonruptured: Secondary | ICD-10-CM

## 2021-08-25 ENCOUNTER — Telehealth: Payer: Self-pay | Admitting: Family

## 2021-08-25 ENCOUNTER — Encounter
Payer: Medicare Other | Attending: Physical Medicine and Rehabilitation | Admitting: Physical Medicine and Rehabilitation

## 2021-08-25 ENCOUNTER — Encounter: Payer: Self-pay | Admitting: Physical Medicine and Rehabilitation

## 2021-08-25 ENCOUNTER — Other Ambulatory Visit (INDEPENDENT_AMBULATORY_CARE_PROVIDER_SITE_OTHER): Payer: Medicare Other | Admitting: Family

## 2021-08-25 ENCOUNTER — Other Ambulatory Visit: Payer: Self-pay

## 2021-08-25 ENCOUNTER — Other Ambulatory Visit (HOSPITAL_COMMUNITY)
Admission: RE | Admit: 2021-08-25 | Discharge: 2021-08-25 | Disposition: A | Discharge status: Home / Self Care | Payer: Medicare Other | Source: Ambulatory Visit | Attending: Family | Admitting: Family

## 2021-08-25 ENCOUNTER — Other Ambulatory Visit: Payer: Self-pay | Admitting: Family

## 2021-08-25 VITALS — BP 139/79 | HR 69 | Temp 98.0°F | Ht 66.5 in | Wt 215.0 lb

## 2021-08-25 DIAGNOSIS — M19012 Primary osteoarthritis, left shoulder: Secondary | ICD-10-CM | POA: Diagnosis present

## 2021-08-25 DIAGNOSIS — M25511 Pain in right shoulder: Secondary | ICD-10-CM | POA: Diagnosis present

## 2021-08-25 DIAGNOSIS — M19011 Primary osteoarthritis, right shoulder: Secondary | ICD-10-CM | POA: Diagnosis present

## 2021-08-25 DIAGNOSIS — G894 Chronic pain syndrome: Secondary | ICD-10-CM | POA: Diagnosis present

## 2021-08-25 DIAGNOSIS — R399 Unspecified symptoms and signs involving the genitourinary system: Secondary | ICD-10-CM

## 2021-08-25 DIAGNOSIS — M25512 Pain in left shoulder: Secondary | ICD-10-CM | POA: Diagnosis not present

## 2021-08-25 DIAGNOSIS — Z96651 Presence of right artificial knee joint: Secondary | ICD-10-CM | POA: Diagnosis present

## 2021-08-25 LAB — POCT URINALYSIS DIP (CLINITEK)
Bilirubin, UA: NEGATIVE
Glucose, UA: NEGATIVE mg/dL
Ketones, POC UA: NEGATIVE mg/dL
Nitrite, UA: NEGATIVE
Spec Grav, UA: >=1.03 — AB (ref 1.010–1.025)
Urobilinogen, UA: 0.2 E.U./dL
pH, UA: 5.5 (ref 5.0–8.0)

## 2021-08-25 MED ORDER — BACLOFEN 10 MG PO TABS: 10.0000 mg | ORAL_TABLET | Freq: Every evening | ORAL | 1 refills | Status: DC | PRN

## 2021-08-25 NOTE — Telephone Encounter (Signed)
PT was at Dr. Courtney Heys, MD Physical Medicine [facility] Rehabilitation at time of call. Dr. Dagoberto Ligas asked to speak urgently with Pt's provider regarding UTI.  Dr. Florentina Jenny number is [phone] Thank you.

## 2021-08-25 NOTE — Progress Notes (Signed)
Patient is a 71 yr old R handed female with hx of  Bronchiesctasis, CHF, prediabetes- with A1c of 5.6- as of 02/15/21- - not on blood thinners, has a defibrillator, GERD, and fibromyalgia issues- dx'd 10 years ago.  Here for f/u on chronic pain and trigger oint injections. Last got subacromial shoulder injections B/L on 03/29/21.  S/P R TKR 10/22; also COVID B/L pneumonia now on O2 full time.   Had surgery R knee- had R TKR- 05/22/21- is giving her a fit.   After that, had B/L pneumonia due to [employer].    Wants B/L shoulder injections- subacromial injections.   Saw Rheumatology-  Rheumatology wants me to take over her Baclofen Rx.    Not sleeping well- Still has Norco from knee surgery- has taken the last few nights, but wondering what can take for sleep.    Stopped taking her gabapentin -worried will affect breathing.   Has been on O2 since COVID pneumonia-esp needs it the last week.  Also feels like getting UTI- wants U/A checked. Urinary frequency, but very small volumes- hurts- feels yucky all over.   Will likely be put back on steroids next week when sees  Pulmonary.    Plan: Called PCP to make sure pt can get a Urinalysis and U Cx done- spoke to front desk and gave them my info to call me- if cannot get ahold of them via me or yourself, need to go to urgent care. Sounds like has a bad UTI based on Sx's. Sx's 4-5 days.   2.   Restart Gabapentin 300-600 mg nightly- has sme already- call if needs refill. For sleep.    3. Doesn't need refills of Tramadol right now- only taking ~ 1x/day- call when needs refills..   4. Max 1 glass of 4 Oz of wine in 24 hour period.    5.  Baclofen 10 mg QHS for muscle tightness- #30 5 refills.   6. steroid injection was performed at subacromial shoulder injections  B/L using 1% plain Lidocaine and 40mg  /1cc of Kenalog. This was well tolerated.  Cleaned with betadine x3 and allowed to dry- then alcohol then injected using 27 gauge 1.5 inch  needle- no bleeding or complications.    F/U in 3 months for steroid injections of shoulders -subacromial shoulders injections. .  Lidocaine will kick in 15 minutes- and wear off tonight- the steroid will kick in tomorrow within 24 hours and take up to 72 hours to fully kick in.  7. F/U in 3 months for B/L shoulder injections. And f/u on chronic pain.

## 2021-08-25 NOTE — Patient Instructions (Signed)
Plan: Called PCP to make sure pt can get a Urinalysis and U Cx done- spoke to front desk and gave them my info to call me- if cannot get ahold of them via me or yourself, need to go to urgent care. Sounds like has a bad UTI based on Sx's. Sx's 4-5 days.   2.   Restart Gabapentin 300-600 mg nightly- has sme already- call if needs refill. For sleep.    3. Doesn't need refills of Tramadol right now- only taking ~ 1x/day- call when needs refills..   4. Max 1 glass of 4 Oz of wine in 24 hour period.    5.  Baclofen 10 mg QHS for muscle tightness- #30 5 refills.   6. steroid injection was performed at subacromial shoulder injections  B/L using 1% plain Lidocaine and 40mg  /1cc of Kenalog. This was well tolerated.  Cleaned with betadine x3 and allowed to dry- then alcohol then injected using 27 gauge 1.5 inch needle- no bleeding or complications.    F/U in 3 months for steroid injections of shoulders -subacromial shoulders injections. .  Lidocaine will kick in 15 minutes- and wear off tonight- the steroid will kick in tomorrow within 24 hours and take up to 72 hours to fully kick in.  7. F/U in 3 months for B/L shoulder injections. And f/u on chronic pain.

## 2021-08-26 NOTE — Progress Notes (Signed)
Sending urine for culture.

## 2021-08-28 ENCOUNTER — Encounter: Payer: Self-pay | Admitting: Family

## 2021-08-28 ENCOUNTER — Other Ambulatory Visit: Payer: Self-pay | Admitting: Family

## 2021-08-28 DIAGNOSIS — B3731 Acute candidiasis of vulva and vagina: Secondary | ICD-10-CM

## 2021-08-28 LAB — CERVICOVAGINAL ANCILLARY ONLY
Bacterial Vaginitis (gardnerella): NEGATIVE
Candida Glabrata: NEGATIVE
Candida Vaginitis: POSITIVE — AB
Chlamydia: NEGATIVE
Comment: NEGATIVE
Comment: NEGATIVE
Comment: NEGATIVE
Comment: NEGATIVE
Comment: NEGATIVE
Comment: NORMAL
Neisseria Gonorrhea: NEGATIVE
Trichomonas: NEGATIVE

## 2021-08-28 MED ORDER — FLUCONAZOLE 150 MG PO TABS: 150.0000 mg | ORAL_TABLET | Freq: Once | ORAL | 0 refills | Status: AC

## 2021-08-28 NOTE — Progress Notes (Signed)
Gonorrhea, Chlamydia, Trichomonas, and Bacterial Vaginitis negative.   Positive for Candida Vaginitis which is better known as a yeast infection. Prescribed Fluconazole 150 mg (1 tablet) for a one time dose.

## 2021-08-29 ENCOUNTER — Ambulatory Visit (INDEPENDENT_AMBULATORY_CARE_PROVIDER_SITE_OTHER): Payer: Medicare Other

## 2021-08-29 ENCOUNTER — Telehealth: Payer: Self-pay | Admitting: Adult Health

## 2021-08-29 ENCOUNTER — Encounter: Payer: Self-pay | Admitting: Nurse Practitioner

## 2021-08-29 ENCOUNTER — Ambulatory Visit (INDEPENDENT_AMBULATORY_CARE_PROVIDER_SITE_OTHER): Payer: Medicare Other | Admitting: Nurse Practitioner

## 2021-08-29 ENCOUNTER — Other Ambulatory Visit: Payer: Self-pay | Admitting: Family

## 2021-08-29 ENCOUNTER — Telehealth: Payer: Self-pay | Admitting: Nurse Practitioner

## 2021-08-29 ENCOUNTER — Other Ambulatory Visit: Payer: Self-pay

## 2021-08-29 VITALS — BP 120/60 | HR 62 | Temp 97.7°F | Ht 66.5 in | Wt 210.6 lb

## 2021-08-29 DIAGNOSIS — J358 Other chronic diseases of tonsils and adenoids: Secondary | ICD-10-CM

## 2021-08-29 DIAGNOSIS — G4733 Obstructive sleep apnea (adult) (pediatric): Secondary | ICD-10-CM

## 2021-08-29 DIAGNOSIS — J029 Acute pharyngitis, unspecified: Secondary | ICD-10-CM | POA: Diagnosis not present

## 2021-08-29 DIAGNOSIS — R319 Hematuria, unspecified: Secondary | ICD-10-CM

## 2021-08-29 DIAGNOSIS — N390 Urinary tract infection, site not specified: Secondary | ICD-10-CM

## 2021-08-29 DIAGNOSIS — R2241 Localized swelling, mass and lump, right lower limb: Secondary | ICD-10-CM

## 2021-08-29 DIAGNOSIS — R0602 Shortness of breath: Secondary | ICD-10-CM | POA: Diagnosis not present

## 2021-08-29 DIAGNOSIS — M7989 Other specified soft tissue disorders: Secondary | ICD-10-CM | POA: Diagnosis not present

## 2021-08-29 DIAGNOSIS — J301 Allergic rhinitis due to pollen: Secondary | ICD-10-CM | POA: Diagnosis not present

## 2021-08-29 DIAGNOSIS — Z9289 Personal history of other medical treatment: Secondary | ICD-10-CM

## 2021-08-29 DIAGNOSIS — R7989 Other specified abnormal findings of blood chemistry: Secondary | ICD-10-CM

## 2021-08-29 DIAGNOSIS — J471 Bronchiectasis with (acute) exacerbation: Secondary | ICD-10-CM | POA: Diagnosis not present

## 2021-08-29 LAB — BASIC METABOLIC PANEL
BUN: 19 mg/dL (ref 6–23)
CO2: 30 mEq/L (ref 19–32)
Calcium: 10.1 mg/dL (ref 8.4–10.5)
Chloride: 98 mEq/L (ref 96–112)
Creatinine, Ser: 0.89 mg/dL (ref 0.40–1.20)
GFR: 64.55 mL/min (ref >60.00)
Glucose, Bld: 104 mg/dL — ABNORMAL HIGH (ref 70–99)
Potassium: 3.8 mEq/L (ref 3.5–5.1)
Sodium: 135 mEq/L (ref 135–145)

## 2021-08-29 LAB — D-DIMER, QUANTITATIVE: D-Dimer, Quant: 4.31 mcg/mL FEU — ABNORMAL HIGH (ref <0.50)

## 2021-08-29 LAB — URINE CULTURE

## 2021-08-29 LAB — SPECIMEN STATUS REPORT

## 2021-08-29 LAB — POCT RAPID STREP A (OFFICE): Rapid Strep A Screen: NEGATIVE

## 2021-08-29 MED ORDER — AZELASTINE HCL 0.1 % NA SOLN: 1.0000 | Freq: Two times a day (BID) | NASAL | 2 refills | Status: DC

## 2021-08-29 MED ORDER — PREDNISONE 20 MG PO TABS: 40.0000 mg | ORAL_TABLET | Freq: Every day | ORAL | 0 refills | Status: AC

## 2021-08-29 NOTE — Assessment & Plan Note (Signed)
Unable to swab for strep pharyngitis at visit due to lab not having proper swab. Requested pt follow up with PCP. Recent diagnosis of UTI - not on abx therapy yet so she is following up with PCP today. CENTOR score 1 indicating not appropriate for empiric tx.

## 2021-08-29 NOTE — Assessment & Plan Note (Signed)
Recently tested for UTI with UA by PCP. UA showed large amount of leuks, trace blood indicating likely UTI. Pt is to follow up with PCP for treatment. Plans to swab for strep with PCP.

## 2021-08-29 NOTE — Assessment & Plan Note (Signed)
Continue flonase, singulair, allegra, and nasal irrigations. Add astelin nasal spray 1 spray each nostril Twice daily until symptoms improve then as needed. See above.

## 2021-08-29 NOTE — Telephone Encounter (Signed)
Pt contacted.

## 2021-08-29 NOTE — Patient Instructions (Addendum)
-  Continue Albuterol inhaler 2 puffs or 3 mL neb every 6 hours as needed for shortness of breath or wheezing. Notify if symptoms persist despite rescue inhaler/neb use. -Continue Symbicort 2 puffs Twice daily. Brush tongue and rinse mouth afterwards  -Continue flonase nasal spray 1-2 sprays each nostril daily -Continue allegra 180 mg daily -Continue singulair 10 mg At bedtime  -Continue protonix 40 mg daily  -Increase mucinex to 1200 mg Twice daily  -Continue flutter valve -Continue nasal irrigations Twice daily  -Continue supplemental oxygen at 2 lpm with activity for oxygen saturations >88-90%  -Start hypertonic saline nebs Twice daily until symptoms improve. Call your pharmacy for refill. -Prednisone 40 mg for 5 days. Take in AM with food. -Astelin nasal spray 1 spray each nostril Twice daily until symptoms improve  Labs today - BMET and D dimer  Doppler of right lower extremity ordered.   Chest x ray today. We will notify you of any abnormal results.   Follow up with PCP for strep testing.  Continue to use CPAP every night, minimum of 4-6 hours a night.  Change equipment every 30 days or as directed by DME. Wash your tubing with warm soap and water daily, hang to dry. Wash humidifier portion weekly.  Maintain clean equipment, as directed by home health agency.  Be aware of reduced alertness and do not drive or operate heavy machinery if experiencing this or drowsiness.  Exercise encouraged, as tolerated. Healthy weight management discussed.  Avoid or decrease alcohol consumption and medications that make you more sleepy, if possible. Notify if persistent daytime sleepiness occurs even with consistent use of CPAP.  Follow up in with Dr. Halford Chessman or Roxan Diesel, NP in two weeks. If symptoms do not improve or worsen, please contact office for sooner follow up or seek emergency care.

## 2021-08-29 NOTE — Progress Notes (Signed)
Reviewed and agree with assessment/plan.   Chesley Mires, MD [facility] Pulmonary/Critical Care 08/29/2021, 1:34 PM Pager:  [phone]

## 2021-08-29 NOTE — Progress Notes (Signed)
No treatment needed for urine culture.

## 2021-08-29 NOTE — Progress Notes (Addendum)
@Patient  ID: [name], female    DOB: May 06, 1951, 71 y.o.   MRN: [id number]  Chief Complaint  Patient presents with   Follow-up    Increased sob x 1 wk.,occass. wheezing, cough non-prod.,hoarseness    Referring provider: Camillia Herter, NP  HPI: 71 year old female, former smoker (15 pack years) followed for bronchiectasis, obstructive sleep apnea, and chronic respiratory failure with hypoxia. She is a patient of Dr. Juanetta Gosling and last seen in office on 06/26/2021. Past medical history significant for HTN, MVP, VT s/p AICD, hx of PE, PFO, allergic rhinitis, GERD, hypothyroidism, osteoporosis, HLD, depression with anxiety, fibromyalgia, prediabetes, RLS, Takotsubo CM, cerebral aneurysm, breast cancer 2016.   TEST/EVENTS:  11/29/2008 PSG: AHI 9 04/02/2012 ACE level: 1 04/08/2012 CT chest: Bilateral lower lobe cylindrical bronchiectasis and some in upper lobes 04/17/2012 PFTs: FEV1 2.6 (110), FEV1% 85, TLC 4.04 (77), DLCO 44% 10/25/2015: ESR 40, ACE 32 10/26/2015 CTA chest: Patchy GGO in periphery of the lungs bilaterally, basilar bronchiectasis, no PE; no significant change compared to 2013. 11/10/2015 HST: AHI 7.1, SPO2 low of 75% 01/17/2017 PFTs: FEV1 2.38 (111), FEV1% 90, TLC 3.85 (71), DLCO 58%, no BD 05/13/2017 echocardiogram: Mild LVH, EF 55 to 60%, G1 DD, mild TR 05/30/2017: RAST - dust mites; IgA, IgG, IgM normal 10/08/2018 HRCT chest: Atherosclerosis, basilar predominant cylindrical bronchiectasis, mild centrilobular/paraseptal emphysema, air trapping 10/16/2018 PFTs: FEV1 2.27 (111), FEV1% 90, TLC 4 (74), DLCO 59% 10/16/2018: HP panel negative, ACE 25 03/24/2019 CTA chest: Chronic lung changes with peribronchial thickening, bibasilar atelectasis and chronic interstitial disease/peripheral fibrosis at the lung bases 06/03/2021 CTA chest: Scattered GGO with fibrotic changes  06/26/2021: OV with Dr. Halford Chessman. Previously admitted in October for COVID pneumonia and discharged on 2 lpm  supplemental O2. Continued symbicort. PRN abluterol, mucinex, flutter valve, and hypertonic nebs. Has a vibrating bed for chest percussion. Continue singulair, allegra, flonase and nasal irrigations. Compliant with CPAP therapy - 5 cmH2O. CXR with patchy bilateral airspace disease slightly worse from previous - not tx d/t clinical stability.   08/29/2021: Today - acute visit Patient presents today for increased shortness of breath and cough. Her symptoms started early last week and have mildly improved since then. She continues to experience a slight increase in her shortness of breath with exertion. She has noticed some more wheezing and an increase in her cough, which is non-productive. She did report that her husband was sick last week with a cold but never tested for COVID or flu. She has had a mild sore throat and increased hoarseness which she contributes to post nasal drip. Her breathing improved with starting mucinex Twice daily and increasing her lasix. She did notice some lower extremity swelling in her right leg last night with mild tenderness. It improved with elevation. She had surgery on this knee in October. She wears 2 lpm oxygen at home with activity and has noticed that her oxygen level was staying around 90% on room air. She was 100% on room air today in office. She denies hemoptysis, PND, orthopnea, or chest pain. She continues on Symbicort Twice daily. She was using her albuterol more frequently last week but has not had to use it the past few days. She continues on Singulair, allegra, flonase, protonix. She continues on CPAP nightly and denies drowsy driving or daytime fatigue. We were unable to obtain download today and have requested SD card.  Allergies  Allergen Reactions   Ace Inhibitors Swelling    Angioedema; makes tongue "break  out"    Amitriptyline Hcl Other (See Comments)     makes her too sleepy!   Atorvastatin Other (See Comments)    SEVERE MYALGIA   Cymbalta [Duloxetine  Hcl] Nausea Only and Other (See Comments)    Sleepiness/ sick   Dilantin [Phenytoin Sodium Extended] Rash    Severe rash   Paroxetine Nausea Only    Rapid heartbeat   Ramipril Other (See Comments)    TONGUE ULCERS    Rosuvastatin Other (See Comments)    SEVERE MYALGIA   Carbamazepine Rash   Codeine Itching   Paxlovid [Nirmatrelvir-Ritonavir] Itching   Phenytoin Sodium Extended Rash   Simvastatin Other (See Comments)    Increase in CK, myalgias   Wellbutrin [Bupropion] Palpitations    Immunization History  Administered Date(s) Administered   Fluad Quad(high Dose 65+) 04/17/2019, 04/30/2020   H1N1 07/24/2008   Influenza Split 07/17/2011, 09/27/2011, 04/29/2012   Influenza Whole 07/20/2004, 07/04/2007, 05/07/2008, 05/19/2009, 05/17/2010   Influenza, High Dose Seasonal PF 05/30/2017, 05/21/2018   Influenza,inj,Quad PF,6+ Mos 05/20/2013, 05/06/2014, 05/17/2015, 05/15/2016, 04/27/2021   Influenza-Unspecified 05/07/2018   PFIZER(Purple Top)SARS-COV-2 Vaccination 09/25/2019, 10/16/2019, 06/04/2020, 01/04/2021, 08/06/2021   Pneumococcal Conjugate-13 11/22/2015, 08/06/2021   Pneumococcal Polysaccharide-23 05/20/2002, 05/07/2008, 06/25/2014   Td 09/07/2009   Zoster, Live 09/21/2011    Past Medical History:  Diagnosis Date   AICD (automatic cardioverter/defibrillator) present    Anxiety    Arthritis    Back pain    Breast cancer ([place]) 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   Bronchiectasis ([place])    Cerebral aneurysm, nonruptured    had a clip put in   CHF (congestive heart failure) ([place])    Clostridium difficile infection    Depressive disorder, not elsewhere classified    Diverticulosis of colon (without mention of hemorrhage)    Esophageal reflux    Fatty liver    Fibromyalgia    Gastritis    GERD (gastroesophageal reflux disease)    GI bleed 2004   Glaucoma    Hiatal hernia    History of COVID-19 05/04/2021   Hyperlipidemia    Hypertension    Hypothyroidism     Internal hemorrhoids    Joint pain    Multifocal pneumonia 06/03/2021   Obstructive sleep apnea (adult) (pediatric)    Osteoarthritis    Ostium secundum type atrial septal defect    Other chronic nonalcoholic liver disease    Other pulmonary embolism and infarction    Palpitations    Paroxysmal ventricular tachycardia    Personal history of radiation therapy    PONV (postoperative nausea and vomiting)    Presence of permanent cardiac pacemaker    PUD (peptic ulcer disease)    Radiation 02/03/15-03/10/15   Right Breast   Sarcoid    per pt , not sure   Schatzki's ring    Shortness of breath    Sleep apnea    Stroke ([place]) 2013   tia/ pt feels it was around 2008 0r 2009   Takotsubo syndrome    Tubular adenoma of colon    Unspecified transient cerebral ischemia    Unspecified vitamin D deficiency     Tobacco History: Social History   Tobacco Use  Smoking Status Former   Packs/day: 1.00   Years: 15.00   Pack years: 15.00   Types: Cigarettes   Quit date: 08/20/1989   Years since quitting: 32.0  Smokeless Tobacco Never  Tobacco Comments   Started at 41; less than 1 PPD  Counseling given: Not Answered Tobacco comments: Started at 48; less than 1 PPD   Outpatient Medications Prior to Visit  Medication Sig Dispense Refill   acetaminophen (TYLENOL) 500 MG tablet Take 500 mg by mouth every 6 (six) hours as needed for moderate pain.     albuterol (PROVENTIL) (2.5 MG/3ML) 0.083% nebulizer solution USE 1 VIAL IN NEBULIZER EVERY 6 HOURS (Patient taking differently: Take 2.5 mg by nebulization every 6 (six) hours as needed for shortness of breath.) 120 mL 11   albuterol (VENTOLIN HFA) 108 (90 Base) MCG/ACT inhaler Inhale 2 puffs into the lungs every 6 (six) hours as needed for wheezing or shortness of breath. 18 g 3   Ascorbic Acid (VITAMIN C) 1000 MG tablet Take 1,000 mg by mouth daily.     aspirin EC 81 MG tablet Take 81 mg by mouth daily. Swallow whole.     baclofen (LIORESAL)  10 MG tablet Take 1 tablet (10 mg total) by mouth at bedtime as needed for muscle spasms. 90 tablet 1   budesonide-formoterol (SYMBICORT) 80-4.5 MCG/ACT inhaler INHALE 2 PUFFS TWICE DAILY IN THE MORNING AND AT BEDTIME 3 each 6   calcium carbonate (OS-CAL - DOSED IN MG OF ELEMENTAL CALCIUM) 1250 (500 Ca) MG tablet Take 1 tablet by mouth daily.     carvedilol (COREG) 12.5 MG tablet Take 1 tablet (12.5 mg total) by mouth 2 (two) times daily. 180 tablet 3   cetirizine (ZYRTEC) 10 MG tablet Take 10 mg by mouth daily as needed for allergies.     Cholecalciferol (DIALYVITE VITAMIN D 5000) 125 MCG (5000 UT) capsule Take 5,000 Units by mouth daily.     diclofenac Sodium (VOLTAREN) 1 % GEL Apply 2-4 grams to affected joint 4 times daily as needed. (Patient taking differently: Apply 2-4 g topically 4 (four) times daily as needed (joint pain).) 400 g 2   ezetimibe (ZETIA) 10 MG tablet Take 1 tablet (10 mg total) by mouth daily. 30 tablet 11   fexofenadine (ALLEGRA) 180 MG tablet Take 180 mg by mouth daily as needed for allergies (alternates with zyrtec sometimes).     fluticasone (FLONASE) 50 MCG/ACT nasal spray Place 2 sprays into both nostrils daily as needed for allergies or rhinitis. (Patient taking differently: Place 2 sprays into both nostrils daily.) 48 g 3   furosemide (LASIX) 20 MG tablet Take 40 mg (2 tabs) 3 days per week. Take 20 mg the other days of the week. 90 tablet 3   hydroxypropyl methylcellulose / hypromellose (ISOPTO TEARS / GONIOVISC) 2.5 % ophthalmic solution Place 1 drop into both eyes at bedtime.     latanoprost (XALATAN) 0.005 % ophthalmic solution Place 1 drop into both eyes at bedtime.     levothyroxine (SYNTHROID) 50 MCG tablet Take 1 tablet (50 mcg total) by mouth daily. 90 tablet 1   magnesium oxide (MAG-OX) 400 MG tablet Take 1 tablet (400 mg total) by mouth in the morning, at noon, and at bedtime. (Patient taking differently: Take 400 mg by mouth 2 (two) times daily.) 270 tablet 3    mometasone (ELOCON) 0.1 % lotion Apply 1 application topically daily as needed (rash). 60 mL 1   montelukast (SINGULAIR) 10 MG tablet TAKE 1 TABLET AT BEDTIME 90 tablet 3   Multiple Vitamins-Minerals (MULTIVITAMIN PO) Take 1 tablet by mouth daily.     nitroGLYCERIN (NITROSTAT) 0.4 MG SL tablet DISSOLVE ONE TABLET UNDER THE TONGUE EVERY 5 MINUTES AS NEEDED FOR CHEST PAIN.  DO NOT EXCEED A  TOTAL OF 3 DOSES IN 15 MINUTES (Patient taking differently: Place 0.4 mg under the tongue every 5 (five) minutes as needed for chest pain.) 25 tablet 0   pantoprazole (PROTONIX) 40 MG tablet Take 1 tablet (40 mg total) by mouth daily. (Patient taking differently: Take 40 mg by mouth See admin instructions. Take 40 mg daily, may take a second 40 mg dose as needed for acid reflux) 30 tablet 11   phenol (CHLORASEPTIC) 1.4 % LIQD Use as directed 1 spray in the mouth or throat 3 (three) times daily as needed for throat irritation / pain. 20 mL 0   polyethylene glycol powder (GLYCOLAX/MIRALAX) powder Take 17 grams by mouth daily 1080 g 0   potassium chloride (KLOR-CON M) 10 MEQ tablet TAKE 1/2 TABLET EVERY DAY (CALL OFFICE TO SCHEDULE YEARLY APPT) 30 tablet 11   Respiratory Therapy Supplies (FLUTTER) DEVI 1 Device by Does not apply route as needed. 1 each 0   sodium chloride HYPERTONIC 3 % nebulizer solution Take by nebulization 2 (two) times daily as needed for cough. 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 750 mL 12   traMADol (ULTRAM) 50 MG tablet Take 1 tablet (50 mg total) by mouth 2 (two) times daily. (Patient taking differently: Take 50 mg by mouth 2 (two) times daily as needed for moderate pain.) 180 tablet 1   traZODone (DESYREL) 50 MG tablet Take 0.5-1 tablets (25-50 mg total) by mouth at bedtime as needed for sleep. 60 tablet 0   aspirin EC 325 MG EC tablet Take 1 tablet (325 mg total) by mouth 2 (two) times daily. 30 tablet 0   HYDROcodone-acetaminophen (NORCO/VICODIN) 5-325 MG tablet Take 1 tablet by mouth every 6 (six) hours  as needed for pain. (Patient not taking: Reported on 08/29/2021)     methocarbamol (ROBAXIN) 500 MG tablet Take 1-2 tablets (500-1,000 mg total) by mouth every 6 (six) hours as needed for muscle spasms. (Patient not taking: Reported on 08/29/2021) 60 tablet 0   zolpidem (AMBIEN) 5 MG tablet Take 1 tablet (5 mg total) by mouth at bedtime as needed for sleep. (Patient not taking: Reported on 08/29/2021) 90 tablet 0   predniSONE (DELTASONE) 10 MG tablet 3tabs x2d, 2tabs x2d, 1 tab x 2d (Patient not taking: Reported on 08/29/2021) 12 tablet 0   No facility-administered medications prior to visit.     Review of Systems:   Constitutional: No weight loss or gain, night sweats, fevers, chills, fatigue, or lassitude. HEENT: No headaches, difficulty swallowing, tooth/dental problems. No sneezing, itching, ear ache. +sore throat, hoarseness, post nasal drip CV:  +right lower extremity swelling and mild tenderness. No chest pain, orthopnea, PND, anasarca, dizziness, palpitations, syncope Resp: +shortness of breath with exertion; wheezing; non-productive cough. No excess mucus or change in color of mucus.  No hemoptysis. No chest wall deformity GI:  No heartburn, indigestion, abdominal pain, nausea, vomiting, diarrhea, change in bowel habits, loss of appetite, bloody stools.  GU: No dysuria, change in color of urine, urgency or frequency.  No flank pain, no hematuria  Skin: No rash, lesions, ulcerations MSK:  No joint pain or swelling.  No decreased range of motion.  No back pain. Neuro: No dizziness or lightheadedness.  Psych: No depression or anxiety. Mood stable.     Physical Exam:  BP 120/60 (BP Location: Left Arm, Cuff Size: Large)    Pulse 62    Temp 97.7 F (36.5 C) (Temporal)    Ht 5' 6.5" (1.689 m)    Wt 210 lb 9.6  oz (95.5 kg)    LMP  (LMP Unknown)    SpO2 100%    BMI 33.48 kg/m   GEN: Pleasant, interactive, well-appearing; obese; in no acute distress. HEENT:  Normocephalic and atraumatic.  EACs patent bilaterally. TM pearly gray with present light reflex bilaterally. PERRLA. Sclera white. Nasal turbinates pink, moist and patent bilaterally. No rhinorrhea present. Oropharynx erythematous and moist. Bilateral tonsillar white to yellow exudate.  NECK:  Supple w/ fair ROM. No JVD present. Normal carotid impulses w/o bruits. Thyroid symmetrical with no goiter or nodules palpated. +cervical lymphadenopathy.  CV: RRR, no m/r/g, no peripheral edema. Pulses intact, +2 bilaterally. No cyanosis, pallor or clubbing. PULMONARY:  Unlabored, regular breathing. Clear bilaterally A&P w/o wheezes/rales/rhonchi. No accessory muscle use. No dullness to percussion. GI: BS present and normoactive. Soft, non-tender to palpation. No organomegaly or masses detected. No CVA tenderness. MSK: No erythema, warmth or tenderness. Cap refil <2 sec all extrem. No deformities or joint swelling noted. No edema noted upon exam. Neuro: A/Ox3. No focal deficits noted.   Skin: Warm, no lesions or rashe Psych: Normal affect and behavior. Judgement and thought content appropriate.     Lab Results:  CBC    Component Value Date/Time   WBC 7.1 06/05/2021 0420   RBC 3.29 (L) 06/05/2021 0420   HGB 9.5 (L) 06/05/2021 0420   HGB 13.5 02/15/2021 1429   HGB 12.4 12/01/2014 0845   HCT 31.2 (L) 06/05/2021 0420   HCT 41.9 02/15/2021 1429   HCT 38.8 12/01/2014 0845   PLT 434 (H) 06/05/2021 0420   PLT 212 02/15/2021 1429   MCV 94.8 06/05/2021 0420   MCV 94 02/15/2021 1429   MCV 88.5 12/01/2014 0845   MCH 28.9 06/05/2021 0420   MCHC 30.4 06/05/2021 0420   RDW 14.1 06/05/2021 0420   RDW 11.9 02/15/2021 1429   RDW 12.7 12/01/2014 0845   LYMPHSABS 0.8 06/03/2021 0927   LYMPHSABS 0.8 02/15/2021 1429   LYMPHSABS 1.0 12/01/2014 0845   MONOABS 0.7 06/03/2021 0927   MONOABS 0.3 12/01/2014 0845   EOSABS 0.2 06/03/2021 0927   EOSABS 0.1 02/15/2021 1429   EOSABS 0.1 03/13/2007 1249   BASOSABS 0.1 06/03/2021 0927   BASOSABS  0.0 02/15/2021 1429   BASOSABS 0.0 12/01/2014 0845    BMET    Component Value Date/Time   NA 135 08/29/2021 1312   NA 139 08/07/2021 1449   NA 141 12/01/2014 0847   K 3.8 08/29/2021 1312   K 3.8 12/01/2014 0847   CL 98 08/29/2021 1312   CO2 30 08/29/2021 1312   CO2 26 12/01/2014 0847   GLUCOSE 104 (H) 08/29/2021 1312   GLUCOSE 175 (H) 12/01/2014 0847   BUN 19 08/29/2021 1312   BUN 10 08/07/2021 1449   BUN 10.6 12/01/2014 0847   CREATININE 0.89 08/29/2021 1312   CREATININE 0.82 02/27/2017 1518   CREATININE 0.8 12/01/2014 0847   CALCIUM 10.1 08/29/2021 1312   CALCIUM 9.8 12/01/2014 0847   GFRNONAA >60 06/07/2021 0359   GFRNONAA 74 02/27/2017 1518   GFRAA 82 10/01/2019 1408   GFRAA 85 02/27/2017 1518    BNP    Component Value Date/Time   BNP 24.6 06/03/2021 0958   BNP 30.2 08/16/2016 1452     Imaging:  DG Chest 2 View  Result Date: 08/29/2021 CLINICAL DATA:  Increased cough and shortness of breath. EXAM: CHEST - 2 VIEW COMPARISON:  Chest radiographs 06/26/2021 and earlier FINDINGS: A single lead ICD remains in place. The cardiomediastinal  silhouette is unchanged with borderline to mild cardiac enlargement. Peribronchial thickening and diffuse interstitial prominence throughout both lungs are similar to radiographs from 05/17/2021. No definite acute airspace consolidation, pleural effusion, pneumothorax is identified. No acute osseous abnormality is seen. IMPRESSION: Similar appearance of chronic lung findings which could potentially reflect interstitial lung disease. No convincing acute findings. Electronically Signed   By: Logan Bores M.D.   On: 08/29/2021 13:38   CT Angio Chest Pulmonary Embolism (PE) W or WO Contrast  Result Date: 08/30/2021 CLINICAL DATA:  Severe and worsening shortness of breath. Wheezing and cough over the last week. EXAM: CT ANGIOGRAPHY CHEST WITH CONTRAST TECHNIQUE: Multidetector CT imaging of the chest was performed using the standard protocol  during bolus administration of intravenous contrast. Multiplanar CT image reconstructions and MIPs were obtained to evaluate the vascular anatomy. RADIATION DOSE REDUCTION: This exam was performed according to the departmental dose-optimization program which includes automated exposure control, adjustment of the mA and/or kV according to patient size and/or use of iterative reconstruction technique. CONTRAST:  52m OMNIPAQUE IOHEXOL 350 MG/ML SOLN COMPARISON:  Chest radiography yesterday.  Chest CT 06/03/2021 FINDINGS: Cardiovascular: Mild cardiomegaly as seen previously. Small amount of coronary artery calcification and aortic atherosclerotic calcification. Pulmonary arterial opacification is good. Main pulmonary arteries are prominent suggesting pulmonary arterial hypertension. There are no central pulmonary emboli. No visible peripheral pulmonary emboli, though detail is somewhat limited by breathing motion in the smaller vessels. Pacemaker/AICD in place. Mediastinum/Nodes: No mediastinal or hilar mass or lymphadenopathy. Lungs/Pleura: Bronchiectasis again noted in the lower lungs. Emphysematous changes throughout the lungs. Fibrotic changes with peripheral prominence. No sign of acute infiltrate, collapse or effusion. Upper Abdomen: Negative Musculoskeletal: Chronic degenerative changes affect the spine with ankylosis. Review of the MIP images confirms the above findings. IMPRESSION: No pulmonary emboli. Cardiomegaly. Pacemaker/AICD. Prominent main pulmonary arteries consistent with pulmonary arterial hypertension. Some coronary artery calcification. Aortic atherosclerotic calcification. Chronic interstitial lung disease with bronchiectasis, emphysematous changes, architectural distortion and fibrotic changes. Differential diagnosis is postinfectious/postinflammatory, sarcoid and hypersensitivity pneumonitis. Findings are not typical of UIP. No identifiable acute pneumonia, collapse or pleural effusion.  Electronically Signed   By: MNelson ChimesM.D.   On: 08/30/2021 08:04   ECHOCARDIOGRAM COMPLETE  Result Date: 08/12/2021    ECHOCARDIOGRAM REPORT   Patient Name:   BKEVONNA NOLTEMARKS-THOMPSON Date of Exam: 08/11/2021 Medical Rec #:  [id number]                 Height:       66.5 in Accession #:    [id number]                Weight:       214.8 lb Date of Birth:  2Nov 07, 1952                 BSA:          2.073 m Patient Age:    69years                   BP:           122/68 mmHg Patient Gender: F                          HR:           68 bpm. Exam Location:  [address] Procedure: 2D Echo, Cardiac Doppler and Color Doppler Indications:    R06.02 SOB  History:  Patient has prior history of Echocardiogram examinations, most                 recent 05/13/2017. CHF and Non ischemic cardiomyopathy, ICD,                 Arrythmias:Ventricular tachycardia, Signs/Symptoms:Shortness of                 Breath; Risk Factors:Hypertension.  Sonographer:    Coralyn Helling RDCS Referring Phys: [place]  1. Left ventricular ejection fraction, by estimation, is 50 to 55%. The left ventricle has low normal function. The left ventricle has no regional wall motion abnormalities. There is mild concentric left ventricular hypertrophy. Left ventricular diastolic parameters are indeterminate.  2. Right ventricular systolic function is normal. The right ventricular size is normal. There is mildly elevated pulmonary artery systolic pressure. The estimated right ventricular systolic pressure is 59.9 mmHg.  3. Left atrial size was mildly dilated.  4. Right atrial size was mildly dilated.  5. The mitral valve is grossly normal. Trivial mitral valve regurgitation. No evidence of mitral stenosis.  6. The aortic valve is tricuspid. Aortic valve regurgitation is not visualized. No aortic stenosis is present.  7. The inferior vena cava is normal in size with greater than 50% respiratory variability, suggesting  right atrial pressure of 3 mmHg.  8. Intra atrial shunt cannot be excluded. Comparison(s): No significant change from prior study. Prior images reviewed side by side. Visually EF is similar to prior. FINDINGS  Left Ventricle: Left ventricular ejection fraction, by estimation, is 50 to 55%. The left ventricle has low normal function. The left ventricle has no regional wall motion abnormalities. The left ventricular internal cavity size was normal in size. There is mild concentric left ventricular hypertrophy. Left ventricular diastolic parameters are indeterminate. Right Ventricle: The right ventricular size is normal. No increase in right ventricular wall thickness. Right ventricular systolic function is normal. There is mildly elevated pulmonary artery systolic pressure. The tricuspid regurgitant velocity is 3.02  m/s, and with an assumed right atrial pressure of 3 mmHg, the estimated right ventricular systolic pressure is 35.7 mmHg. Left Atrium: Left atrial size was mildly dilated. Right Atrium: Right atrial size was mildly dilated. Pericardium: There is no evidence of pericardial effusion. Mitral Valve: The mitral valve is grossly normal. There is mild thickening of the mitral valve leaflet(s). Trivial mitral valve regurgitation. No evidence of mitral valve stenosis. Tricuspid Valve: The tricuspid valve is normal in structure. Tricuspid valve regurgitation is mild . No evidence of tricuspid stenosis. Aortic Valve: The aortic valve is tricuspid. Aortic valve regurgitation is not visualized. No aortic stenosis is present. Pulmonic Valve: The pulmonic valve was grossly normal. Pulmonic valve regurgitation is trivial. No evidence of pulmonic stenosis. Aorta: The aortic root, ascending aorta, aortic arch and descending aorta are all structurally normal, with no evidence of dilitation or obstruction. Venous: The inferior vena cava is normal in size with greater than 50% respiratory variability, suggesting right atrial  pressure of 3 mmHg. IAS/Shunts: Intra atrial shunt cannot be excluded. Additional Comments: A device lead is visualized.  LEFT VENTRICLE PLAX 2D LVIDd:         4.60 cm Diastology LVIDs:         3.40 cm LV e' medial:    7.31 cm/s LV PW:         1.30 cm LV E/e' medial:  12.7 LV IVS:        1.10 cm LV  e' lateral:   8.20 cm/s                        LV E/e' lateral: 11.3  RIGHT VENTRICLE             IVC RV S prime:     14.63 cm/s  IVC diam: 1.50 cm TAPSE (M-mode): 2.2 cm RVSP:           39.5 mmHg LEFT ATRIUM             Index        RIGHT ATRIUM           Index LA diam:        3.70 cm 1.78 cm/m   RA Pressure: 3.00 mmHg LA Vol (A2C):   46.1 ml 22.24 ml/m  RA Area:     17.00 cm LA Vol (A4C):   62.9 ml 30.34 ml/m  RA Volume:   47.90 ml  23.11 ml/m LA Biplane Vol: 55.9 ml 26.97 ml/m  AORTIC VALVE LVOT Vmax:   83.96 cm/s LVOT Vmean:  54.920 cm/s LVOT VTI:    0.189 m  AORTA Ao Asc diam: 3.30 cm MITRAL VALVE                TRICUSPID VALVE MV Area (PHT): 4.11 cm     TR Peak grad:   36.5 mmHg MV Decel Time: 185 msec     TR Vmax:        302.00 cm/s MV E velocity: 92.76 cm/s   Estimated RAP:  3.00 mmHg MV A velocity: 119.60 cm/s  RVSP:           39.5 mmHg MV E/A ratio:  0.78                             SHUNTS                             Systemic VTI: 0.19 m Adriana Dresser MD Electronically signed by Adriana Dresser MD Signature Date/Time: 08/12/2021/1:31:14 PM    Final       PFT Results Latest Ref Rng & Units 10/16/2018 01/17/2017  FVC-Pre L 2.52 2.63  FVC-Predicted Pre % 96 98  FVC-Post L 2.49 2.52  FVC-Predicted Post % 94 94  Pre FEV1/FVC % % 90 90  Post FEV1/FCV % % 91 92  FEV1-Pre L 2.27 2.38  FEV1-Predicted Pre % 111 113  FEV1-Post L 2.26 2.32  DLCO uncorrected ml/min/mmHg 12.43 15.90  DLCO UNC% % 59 58  DLCO corrected ml/min/mmHg - 15.80  DLCO COR %Predicted % - 57  DLVA Predicted % 85 80  TLC L 4.00 3.85  TLC % Predicted % 74 71  RV % Predicted % 58 55    No results found for:  NITRICOXIDE      Assessment & Plan:   Bronchiectasis with (acute) exacerbation (HCC) Stable appearance with O2 100% on room air in office. Concern for bronchiectasis flare related to recent viral or possible bacterial infection. Given timeframe, no indication for COVID/flu testing. Increase mucinex to 1200 mg Twice daily until symptoms improve then return to 600 mg Twice daily PRN. Flutter valve therapy. Advised to use hypertonic nebs Twice daily. Continue Symbicort Twice daily. CXR today stable. Prednisone 40 mg for 5 days. Continue supplemental oxygen for goal SpO2 >88-90%.   Patient Instructions  -Continue Albuterol  inhaler 2 puffs or 3 mL neb every 6 hours as needed for shortness of breath or wheezing. Notify if symptoms persist despite rescue inhaler/neb use. -Continue Symbicort 2 puffs Twice daily. Brush tongue and rinse mouth afterwards  -Continue flonase nasal spray 1-2 sprays each nostril daily -Continue allegra 180 mg daily -Continue singulair 10 mg At bedtime  -Continue protonix 40 mg daily  -Increase mucinex to 1200 mg Twice daily  -Continue flutter valve -Continue nasal irrigations Twice daily  -Continue supplemental oxygen at 2 lpm with activity for oxygen saturations >88-90%  -Start hypertonic saline nebs Twice daily until symptoms improve. Call your pharmacy for refill. -Prednisone 40 mg for 5 days. Take in AM with food. -Astelin nasal spray 1 spray each nostril Twice daily until symptoms improve  Labs today - BMET and D dimer  Doppler of right lower extremity ordered.   Chest x ray today. We will notify you of any abnormal results.   Follow up with PCP for strep testing.  Continue to use CPAP every night, minimum of 4-6 hours a night.  Change equipment every 30 days or as directed by DME. Wash your tubing with warm soap and water daily, hang to dry. Wash humidifier portion weekly.  Maintain clean equipment, as directed by home health agency.  Be aware of reduced  alertness and do not drive or operate heavy machinery if experiencing this or drowsiness.  Exercise encouraged, as tolerated. Healthy weight management discussed.  Avoid or decrease alcohol consumption and medications that make you more sleepy, if possible. Notify if persistent daytime sleepiness occurs even with consistent use of CPAP.  Follow up in with Dr. Halford Chessman or Roxan Diesel, NP in two weeks. If symptoms do not improve or worsen, please contact office for sooner follow up or seek emergency care.   Allergic rhinitis Continue flonase, singulair, allegra, and nasal irrigations. Add astelin nasal spray 1 spray each nostril Twice daily until symptoms improve then as needed. See above.   UTI (urinary tract infection) Recently tested for UTI with UA by PCP. UA showed large amount of leuks, trace blood indicating likely UTI. Pt is to follow up with PCP for treatment. Plans to swab for strep with PCP.   Tonsillar exudate Unable to swab for strep pharyngitis at visit due to lab not having proper swab. Requested pt follow up with PCP. Recent diagnosis of UTI - not on abx therapy yet so she is following up with PCP today. CENTOR score 1 indicating not appropriate for empiric tx.   Localized swelling of right lower leg Given recent symptoms of lower extremity swelling and tenderness, increased SOB and hx of PE, d dimer order and lower extremity right doppler. D dimer was elevated in October as well; however, previous CTA in October was unable to entirely rule out the possibility of small, subsegmental PE d/t artifact. D dimer elevated today - CTA ordered and negative for PE.  Pulmonary Embolism Wells Score Select Criteria:  Symptoms of DVT (3 points)  No alternative diagnosis better explains the illness (3 points)  Tachycardia with pulse > 100 (1.5 points)  Immobilization (>= 3 days) or surgery in the previous four weeks (1.5 points)  Prior history of DVT or pulmonary embolism (1.5  points)  Presence of hemoptysis (1 point)  Presence of malignancy (1 point) Results: Total Criteria Point Count:  4.5 Moderate probability  Pulmonary Embolism Risk Score Interpretation Score > 6: High probability Score >= 2 and <= 6: Moderate probability Score < 2: Low  Probability  Obstructive sleep apnea Requested SD card for download. Previously compliant. No new symptoms. Continue CPAP therapy 5 cmH2O.   Clayton Bibles, NP 08/30/2021  Pt aware and understands NP's role.

## 2021-08-29 NOTE — Assessment & Plan Note (Addendum)
Given recent symptoms of lower extremity swelling and tenderness, increased SOB and hx of PE, d dimer order and lower extremity right doppler. D dimer was elevated in October as well; however, previous CTA in October was unable to entirely rule out the possibility of small, subsegmental PE d/t artifact. D dimer elevated today - CTA ordered and negative for PE.  Pulmonary Embolism Wells Score Select Criteria:  Symptoms of DVT (3 points)  No alternative diagnosis better explains the illness (3 points)  Tachycardia with pulse > 100 (1.5 points)  Immobilization (>= 3 days) or surgery in the previous four weeks (1.5 points)  Prior history of DVT or pulmonary embolism (1.5 points)  Presence of hemoptysis (1 point)  Presence of malignancy (1 point) Results: Total Criteria Point Count:  4.5 Moderate probability  Pulmonary Embolism Risk Score Interpretation Score > 6: High probability Score >= 2 and <= 6: Moderate probability Score < 2: Low Probability

## 2021-08-29 NOTE — Telephone Encounter (Signed)
Patient seen by Cobb NP earlier today , D Dimer is elevated  She had dyspnea. At risk for VTE .  Talked with Cobb, NP , she is out of office this evening would like CTa Chest ordered to r/o PE .  Venous doppler is pending for tomorrow.

## 2021-08-29 NOTE — Assessment & Plan Note (Signed)
Requested SD card for download. Previously compliant. No new symptoms. Continue CPAP therapy 5 cmH2O.

## 2021-08-29 NOTE — Telephone Encounter (Signed)
Please call patient with update.   Urinalysis and urine culture both negative for infection. As a result medication for UTI not needed as of present.   Patient's symptomology may have been related to positive yeast infection of which Fluconazole has already been sent to preferred pharmacy.

## 2021-08-29 NOTE — Progress Notes (Signed)
Pulmonology suggests pt takes strep test

## 2021-08-29 NOTE — Assessment & Plan Note (Addendum)
Stable appearance with O2 100% on room air in office. Concern for bronchiectasis flare related to recent viral or possible bacterial infection. Given timeframe, no indication for COVID/flu testing. Increase mucinex to 1200 mg Twice daily until symptoms improve then return to 600 mg Twice daily PRN. Flutter valve therapy. Advised to use hypertonic nebs Twice daily. Continue Symbicort Twice daily. CXR today stable. Prednisone 40 mg for 5 days. Continue supplemental oxygen for goal SpO2 >88-90%.   Patient Instructions  -Continue Albuterol inhaler 2 puffs or 3 mL neb every 6 hours as needed for shortness of breath or wheezing. Notify if symptoms persist despite rescue inhaler/neb use. -Continue Symbicort 2 puffs Twice daily. Brush tongue and rinse mouth afterwards  -Continue flonase nasal spray 1-2 sprays each nostril daily -Continue allegra 180 mg daily -Continue singulair 10 mg At bedtime  -Continue protonix 40 mg daily  -Increase mucinex to 1200 mg Twice daily  -Continue flutter valve -Continue nasal irrigations Twice daily  -Continue supplemental oxygen at 2 lpm with activity for oxygen saturations >88-90%  -Start hypertonic saline nebs Twice daily until symptoms improve. Call your pharmacy for refill. -Prednisone 40 mg for 5 days. Take in AM with food. -Astelin nasal spray 1 spray each nostril Twice daily until symptoms improve  Labs today - BMET and D dimer  Doppler of right lower extremity ordered.   Chest x ray today. We will notify you of any abnormal results.   Follow up with PCP for strep testing.  Continue to use CPAP every night, minimum of 4-6 hours a night.  Change equipment every 30 days or as directed by DME. Wash your tubing with warm soap and water daily, hang to dry. Wash humidifier portion weekly.  Maintain clean equipment, as directed by home health agency.  Be aware of reduced alertness and do not drive or operate heavy machinery if experiencing this or drowsiness.   Exercise encouraged, as tolerated. Healthy weight management discussed.  Avoid or decrease alcohol consumption and medications that make you more sleepy, if possible. Notify if persistent daytime sleepiness occurs even with consistent use of CPAP.  Follow up in with Dr. Halford Chessman or Roxan Diesel, NP in two weeks. If symptoms do not improve or worsen, please contact office for sooner follow up or seek emergency care.

## 2021-08-29 NOTE — Addendum Note (Signed)
Addended by: Vanessa Barbara on: 08/29/2021 03:58 PM   Modules accepted: Orders

## 2021-08-29 NOTE — Progress Notes (Signed)
Rapid Strep A negative. Sending culture for further evaluation.

## 2021-08-30 ENCOUNTER — Ambulatory Visit (HOSPITAL_COMMUNITY)
Admission: RE | Admit: 2021-08-30 | Discharge: 2021-08-30 | Disposition: A | Discharge status: Home / Self Care | Payer: Medicare Other | Source: Ambulatory Visit | Attending: Nurse Practitioner | Admitting: Nurse Practitioner

## 2021-08-30 ENCOUNTER — Ambulatory Visit (HOSPITAL_COMMUNITY)
Admission: RE | Admit: 2021-08-30 | Discharge: 2021-08-30 | Disposition: A | Discharge status: Home / Self Care | Payer: Medicare Other | Source: Ambulatory Visit | Attending: Adult Health | Admitting: Adult Health

## 2021-08-30 ENCOUNTER — Telehealth: Payer: Self-pay | Admitting: Pulmonary Disease

## 2021-08-30 ENCOUNTER — Encounter (HOSPITAL_COMMUNITY): Payer: Self-pay

## 2021-08-30 ENCOUNTER — Telehealth: Payer: Self-pay | Admitting: Interventional Cardiology

## 2021-08-30 ENCOUNTER — Ambulatory Visit (INDEPENDENT_AMBULATORY_CARE_PROVIDER_SITE_OTHER): Payer: Medicare Other

## 2021-08-30 DIAGNOSIS — R7989 Other specified abnormal findings of blood chemistry: Secondary | ICD-10-CM

## 2021-08-30 DIAGNOSIS — M7989 Other specified soft tissue disorders: Secondary | ICD-10-CM | POA: Insufficient documentation

## 2021-08-30 DIAGNOSIS — R000 Tachycardia, unspecified: Secondary | ICD-10-CM

## 2021-08-30 LAB — CUP PACEART REMOTE DEVICE CHECK
Battery Remaining Longevity: 24 mo
Battery Remaining Percentage: 27 %
Brady Statistic RV Percent Paced: 0 %
Date Time Interrogation Session: 20230111030100
HighPow Impedance: 72 Ohm
Implantable Lead Implant Date: 20050603
Implantable Lead Location: 753860
Implantable Lead Model: 185
Implantable Lead Serial Number: 116340
Implantable Pulse Generator Implant Date: 20110505
Lead Channel Impedance Value: 663 Ohm
Lead Channel Pacing Threshold Amplitude: 0.6 V
Lead Channel Pacing Threshold Pulse Width: 0.4 ms
Lead Channel Setting Pacing Amplitude: 2.4 V
Lead Channel Setting Pacing Pulse Width: 0.4 ms
Lead Channel Setting Sensing Sensitivity: 0.4 mV
Pulse Gen Serial Number: 266301

## 2021-08-30 MED ORDER — IOHEXOL 350 MG/ML SOLN
75.0000 mL | Freq: Once | INTRAVENOUS | Status: AC | PRN
  Administered 2021-08-30: 75 mL via INTRAVENOUS

## 2021-08-30 MED ORDER — SODIUM CHLORIDE (PF) 0.9 % IJ SOLN
INTRAMUSCULAR | Status: AC
  Filled 2021-08-30: qty 50

## 2021-08-30 NOTE — Telephone Encounter (Signed)
Please notify patient that she does not have a blood clot in her right lower leg. If she continues to experience swelling in leg, I would follow up with her orthopedic doctor. Notify us if her SOB doesn't improve. Thanks!

## 2021-08-30 NOTE — Telephone Encounter (Signed)
Webb Silversmith returned call about pt's results. Per Webb Silversmith, the doppler study showed no evidence of DVT in the lower extremities, no indirect evidence of obstruction proximal inguinal ligament, no cystic structure found in popliteal fossa with the right extremity and also no evidence of common femoral vein obstruction with the left extremity.  Routing all this to [employer]. Results also can be viewed under CV Procedure of pt's chart.

## 2021-08-30 NOTE — Telephone Encounter (Signed)
EMILY FROM [facility] IS RETURNING CALL REGARDING PTS RESULTS WHEN RETURNING CALL BACK TO TRIAGE, THE INFORMATION CAN BE GIVEN TO ANYONE IN TRIAGE

## 2021-08-30 NOTE — Telephone Encounter (Signed)
Spoke with Raquel Sarna.[facility]... pt had lower extremity today to r/o DVT:   Summary:  RIGHT:  - No evidence of deep vein thrombosis in the lower extremity. No indirect  evidence of obstruction proximal to the inguinal ligament.  - No cystic structure found in the popliteal fossa.     LEFT:  - No evidence of common femoral vein obstruction.     Notes had already been called to [facility].[name]

## 2021-08-30 NOTE — Progress Notes (Signed)
Please notify patient there is no evidence of pulmonary embolism (blood clot in her lungs) on her CT scan. I will follow up with her after her venous doppler to her right leg and let her know of those results once we receive them. Thanks!

## 2021-08-30 NOTE — Telephone Encounter (Signed)
Per Epic patient was able to get Strep/FLU swab done yesterday.   Nothing further needed at this time.

## 2021-08-30 NOTE — Telephone Encounter (Signed)
Called [facility] to speak with Di Kindle but she was with a pt at the time of my call. Stated to have her ask for triage when she returns call.

## 2021-08-31 ENCOUNTER — Encounter: Payer: Self-pay | Admitting: Family

## 2021-08-31 LAB — CULTURE, GROUP A STREP: Strep A Culture: NEGATIVE

## 2021-08-31 NOTE — Telephone Encounter (Signed)
Patient is aware of results and voiced her understanding.  Nothing further needed at this time.

## 2021-08-31 NOTE — Progress Notes (Signed)
Results reviewed with pt by CMA. Negative for DVT.

## 2021-08-31 NOTE — Progress Notes (Signed)
Culture Strep A negative.

## 2021-09-04 ENCOUNTER — Ambulatory Visit: Payer: Medicare Other | Admitting: Pulmonary Disease

## 2021-09-10 ENCOUNTER — Encounter (HOSPITAL_BASED_OUTPATIENT_CLINIC_OR_DEPARTMENT_OTHER): Payer: Medicare Other | Admitting: Internal Medicine

## 2021-09-11 NOTE — Progress Notes (Signed)
Remote ICD transmission.   

## 2021-09-12 ENCOUNTER — Ambulatory Visit (INDEPENDENT_AMBULATORY_CARE_PROVIDER_SITE_OTHER): Payer: Medicare Other | Admitting: Nurse Practitioner

## 2021-09-12 ENCOUNTER — Other Ambulatory Visit: Payer: Self-pay

## 2021-09-12 ENCOUNTER — Encounter: Payer: Self-pay | Admitting: Nurse Practitioner

## 2021-09-12 VITALS — BP 128/70 | HR 65 | Temp 98.2°F | Ht 66.5 in | Wt 214.4 lb

## 2021-09-12 DIAGNOSIS — J479 Bronchiectasis, uncomplicated: Secondary | ICD-10-CM | POA: Diagnosis not present

## 2021-09-12 DIAGNOSIS — J301 Allergic rhinitis due to pollen: Secondary | ICD-10-CM

## 2021-09-12 DIAGNOSIS — G4733 Obstructive sleep apnea (adult) (pediatric): Secondary | ICD-10-CM

## 2021-09-12 DIAGNOSIS — R0609 Other forms of dyspnea: Secondary | ICD-10-CM | POA: Diagnosis not present

## 2021-09-12 DIAGNOSIS — R2241 Localized swelling, mass and lump, right lower limb: Secondary | ICD-10-CM

## 2021-09-12 DIAGNOSIS — R0789 Other chest pain: Secondary | ICD-10-CM

## 2021-09-12 NOTE — Progress Notes (Signed)
Reviewed and agree with assessment/plan.   Chesley Mires, MD [facility] Pulmonary/Critical Care 09/12/2021, 12:52 PM Pager:  [phone]

## 2021-09-12 NOTE — Assessment & Plan Note (Signed)
Improvement with Astelin nasal spray. Suspect hoarseness from postnasal drip. Continue Astelin, flonase, Singulair, and Zyrtec

## 2021-09-12 NOTE — Assessment & Plan Note (Signed)
Continue CPAP therapy. Advised saline nasal gel At bedtime for dry mouth. Requested SD card for download.

## 2021-09-12 NOTE — Patient Instructions (Addendum)
-  Continue Albuterol inhaler 2 puffs or 3 mL neb every 6 hours as needed for shortness of breath or wheezing. Notify if symptoms persist despite rescue inhaler/neb use. -Continue Symbicort 2 puffs Twice daily. Brush tongue and rinse mouth afterwards  -Continue flonase nasal spray 1-2 sprays each nostril daily -Continue allegra 180 mg daily -Continue singulair 10 mg At bedtime  -Continue protonix 40 mg daily  -Continue mucinex 600 mg Twice daily  -Continue flutter valve -Continue nasal irrigations Twice daily  -Continue Astelin nasal spray 1 spray each nostril Twice daily  -Continue supplemental oxygen at 2 lpm with activity for oxygen saturations >88-90%   -Start hypertonic saline nebs Twice daily until symptoms improve then as needed for cough. Will send through medical equipment company -Use saline nasal gel in each nostril At bedtime    Continue to use CPAP every night, minimum of 4-6 hours a night.  Change equipment every 30 days or as directed by DME. Wash your tubing with warm soap and water daily, hang to dry. Wash humidifier portion weekly.  Maintain clean equipment, as directed by home health agency.  Be aware of reduced alertness and do not drive or operate heavy machinery if experiencing this or drowsiness.  Exercise encouraged, as tolerated. Healthy weight management discussed.  Avoid or decrease alcohol consumption and medications that make you more sleepy, if possible. Notify if persistent daytime sleepiness occurs even with consistent use of CPAP.   Follow up in with Dr. Halford Chessman or Roxan Diesel, NP in three months. If symptoms do not improve or worsen, please contact office for sooner follow up or seek emergency care.

## 2021-09-12 NOTE — Assessment & Plan Note (Addendum)
Ongoing for months since COVID per pt with neg troponins and EKG. No current chest pain or tightness or other cardiac symptoms. Has followed up scheduled with cardiology for further evaluation. ED precautions discussed.

## 2021-09-12 NOTE — Addendum Note (Signed)
Addended by: Mathis Bud on: 09/12/2021 01:00 PM   Modules accepted: Orders

## 2021-09-12 NOTE — Assessment & Plan Note (Signed)
Neg for DVT. Advised follow up with ortho.

## 2021-09-12 NOTE — Progress Notes (Signed)
@Patient  ID: [name], female    DOB: 30-Nov-1950, 71 y.o.   MRN: [id number]  Chief Complaint  Patient presents with   Follow-up    Feeling better-sob better, cough occass. Clear and yellow. Wearing CPAP feels like "drying throat out"    Referring provider: Camillia Herter, NP  HPI: 71 year old female, former smoker (15 pack years) followed for bronchiectasis, OSA, and chronic respiratory failure with hypoxia.  She is a patient [name] and last seen in office on 08/29/2021 by [name] NP.  Past medical history significant for hypertension, MVP, VT status post AICD, history of PE, PFO, allergic rhinitis, GERD, hypothyroidism, osteoporosis, HLD, depression with anxiety, fibromyalgia, prediabetes, RLS, Takotsubo CM, cerebral aneurysm, breast cancer 2016.  TEST/EVENTS:  11/29/2008 PSG: AHI 9 04/02/2012: ACE 1 04/08/2012 CT chest: Bilateral lower lobe cylindrical BTX and some upper lobes 04/17/2012 PFTs: FEV1 2.6 (110), FEV1% 85, TLC 4.04 (77), DLCO is 44% 10/26/2015 CTA chest: Patchy GGO in the periphery of the lungs bilaterally, basilar BTX, no PE; no significant change 11/10/2015 HST: AHI 7.1, SPO2 low 75% 01/17/2017 PFTs: FEV1 2.38 (111), FEV1% 90, TLC 3.85 (71), DLCO 58%, no BD 05/13/2017 echocardiogram: Mild LVH, EF 55 to 60%, G1 DD, mild TR 05/30/2017: RAST - dust mites; IgA, IgG, IgM normal 10/08/2018 HRCT chest: Atherosclerosis, basilar predominant cylindrical BTX, mild centrilobular/paraseptal emphysema, air trapping 11/14/2018 PFTs: FEV1 2.27 (111), FEV1% 90, TLC 74, DLCO 59% 10/16/2018: HP panel negative,ACE 25 03/24/2019 CTA chest: Chronic lung changes with peribronchial thickening, bibasilar atelectasis and chronic interstitial disease/peripheral fibrosis of the lung bases 06/03/2021 CTA chest: Scattered GGO with fibrotic changes 08/11/2021 echocardiogram: LVEF 50 to 55%, mild LVH, mildly elevated PA pressure, LA mildly dilated, RA mildly dilated, trivial MVR 08/30/2021 CTA  chest: Mild cardiomegaly, unchanged.  Main PA are prominent suggesting PA hypertension.  No PE. BTX noted in the lower lungs, emphysematous changes throughout the lungs.  Fibrotic changes with peripheral prominence.  Not typical of UIP. 08/30/2021 lower extremity venous Doppler: No evidence of DVT  06/26/2021: OV with [name].  Previously been in October for COVID-pneumonia and discharged on 2 L/min supplemental O2.  Continued Symbicort, as needed albuterol, Mucinex, flutter valve and hypertonic nebs.  Has a vibrating bed for chest progression.  Compliant with CPAP therapy 5 cmH2O. CXR with patchy bilateral airspace disease slightly worse from previous -not treated due to clinical stability.  08/29/2021: OV with Adriana Yuan NP.  Increase shortness of breath and cough x 1 week with minimal improvement since onset. Sore throat and hoarseness. Did note RLE edema with mild tenderness. O2 stable. Tx for bronchiectasis flare with prednisone and increased mucinex to 1200 mg Twice daily. Stared hypertonic saline nebs and astelin nasal spray. Tonsillar exudate noted on exam - unable to swab at visit d/t not having kit so advised PCP swab for strep. Wells criteria score with moderate probability for PE. Venous doppler and D dimer ordered - neg for DVT however d dimer elevated so CTA ordered which was negative for PE.   09/12/2021: Today - follow up [name] after being treated for bronchiectasis flare, likely related to viral URI. Today, she reports feeling much better and that her shortness of breath has resolved except for with long distances, which is baseline for her. She continues to have an occasional cough with a small amount of yellow to clear sputum, which is improved. Her sore throat has also improved but she does still have some hoarseness. She is  awaiting an appt with cardiology to evaluate her atypical chest pains which have been ongoing for months. Her troponins were negative in October  and EKG in December without t wave abnormality. The swelling in her RLE is intermittent and somewhat better. She denies orthopnea, palpitations, PND, or hemoptysis. She denies any fevers/chills. She continues on Symbicort Twice daily and has not had to use her rescue inhaler. She has noticed a significant improvement in her rhinitis with the astelin nasal spray. She does report a dry mouth with her CPAP machine, but otherwise has not had any difficulties with it.   Allergies  Allergen Reactions   Ace Inhibitors Swelling    Angioedema; makes tongue "break out"    Amitriptyline Hcl Other (See Comments)     makes her too sleepy!   Atorvastatin Other (See Comments)    SEVERE MYALGIA   Cymbalta [Duloxetine Hcl] Nausea Only and Other (See Comments)    Sleepiness/ sick   Dilantin [Phenytoin Sodium Extended] Rash    Severe rash   Paroxetine Nausea Only    Rapid heartbeat   Ramipril Other (See Comments)    TONGUE ULCERS    Rosuvastatin Other (See Comments)    SEVERE MYALGIA   Carbamazepine Rash   Codeine Itching   Paxlovid [Nirmatrelvir-Ritonavir] Itching   Phenytoin Sodium Extended Rash   Simvastatin Other (See Comments)    Increase in CK, myalgias   Wellbutrin [Bupropion] Palpitations    Immunization History  Administered Date(s) Administered   Fluad Quad(high Dose 65+) 04/17/2019, 04/30/2020   H1N1 07/24/2008   Influenza Split 07/17/2011, 09/27/2011, 04/29/2012   Influenza Whole 07/20/2004, 07/04/2007, 05/07/2008, 05/19/2009, 05/17/2010   Influenza, High Dose Seasonal PF 05/30/2017, 05/21/2018   Influenza,inj,Quad PF,6+ Mos 05/20/2013, 05/06/2014, 05/17/2015, 05/15/2016, 04/27/2021   Influenza-Unspecified 05/07/2018   PFIZER(Purple Top)SARS-COV-2 Vaccination 09/25/2019, 10/16/2019, 06/04/2020, 01/04/2021, 08/06/2021   Pneumococcal Conjugate-13 11/22/2015, 08/06/2021   Pneumococcal Polysaccharide-23 05/20/2002, 05/07/2008, 06/25/2014   Td 09/07/2009   Zoster, Live 09/21/2011     Past Medical History:  Diagnosis Date   AICD (automatic cardioverter/defibrillator) present    Anxiety    Arthritis    Back pain    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   Bronchiectasis ([place])    Cerebral aneurysm, nonruptured    had a clip put in   CHF (congestive heart failure) ([place])    Clostridium difficile infection    Depressive disorder, not elsewhere classified    Diverticulosis of colon (without mention of hemorrhage)    Esophageal reflux    Fatty liver    Fibromyalgia    Gastritis    GERD (gastroesophageal reflux disease)    GI bleed 2004   Glaucoma    Hiatal hernia    History of COVID-19 05/04/2021   Hyperlipidemia    Hypertension    Hypothyroidism    Internal hemorrhoids    Joint pain    Multifocal pneumonia 06/03/2021   Obstructive sleep apnea (adult) (pediatric)    Osteoarthritis    Ostium secundum type atrial septal defect    Other chronic nonalcoholic liver disease    Other pulmonary embolism and infarction    Palpitations    Paroxysmal ventricular tachycardia    Personal history of radiation therapy    PONV (postoperative nausea and vomiting)    Presence of permanent cardiac pacemaker    PUD (peptic ulcer disease)    Radiation 02/03/15-03/10/15   Right Breast   Sarcoid    per pt , not sure  Schatzki's ring    Shortness of breath    Sleep apnea    Stroke [facility]) 2013   tia/ pt feels it was around 2008 0r 2009   Takotsubo syndrome    Tubular adenoma of colon    Unspecified transient cerebral ischemia    Unspecified vitamin D deficiency     Tobacco History: Social History   Tobacco Use  Smoking Status Former   Packs/day: 1.00   Years: 15.00   Pack years: 15.00   Types: Cigarettes   Quit date: 08/20/1989   Years since quitting: 32.0  Smokeless Tobacco Never  Tobacco Comments   Started at 20; less than 1 PPD   Counseling given: Not Answered Tobacco comments: Started at 65; less than 1 PPD   Outpatient  Medications Prior to Visit  Medication Sig Dispense Refill   acetaminophen (TYLENOL) 500 MG tablet Take 500 mg by mouth every 6 (six) hours as needed for moderate pain.     albuterol (PROVENTIL) (2.5 MG/3ML) 0.083% nebulizer solution USE 1 VIAL IN NEBULIZER EVERY 6 HOURS (Patient taking differently: Take 2.5 mg by nebulization every 6 (six) hours as needed for shortness of breath.) 120 mL 11   albuterol (VENTOLIN HFA) 108 (90 Base) MCG/ACT inhaler Inhale 2 puffs into the lungs every 6 (six) hours as needed for wheezing or shortness of breath. 18 g 3   Ascorbic Acid (VITAMIN C) 1000 MG tablet Take 1,000 mg by mouth daily.     azelastine (ASTELIN) 0.1 % nasal spray Place 1 spray into both nostrils 2 (two) times daily. Use in each nostril as directed 30 mL 2   baclofen (LIORESAL) 10 MG tablet Take 1 tablet (10 mg total) by mouth at bedtime as needed for muscle spasms. 90 tablet 1   budesonide-formoterol (SYMBICORT) 80-4.5 MCG/ACT inhaler INHALE 2 PUFFS TWICE DAILY IN THE MORNING AND AT BEDTIME 3 each 6   calcium carbonate (OS-CAL - DOSED IN MG OF ELEMENTAL CALCIUM) 1250 (500 Ca) MG tablet Take 1 tablet by mouth daily.     carvedilol (COREG) 12.5 MG tablet Take 1 tablet (12.5 mg total) by mouth 2 (two) times daily. 180 tablet 3   cetirizine (ZYRTEC) 10 MG tablet Take 10 mg by mouth daily as needed for allergies.     Cholecalciferol (DIALYVITE VITAMIN D 5000) 125 MCG (5000 UT) capsule Take 5,000 Units by mouth daily.     diclofenac Sodium (VOLTAREN) 1 % GEL Apply 2-4 grams to affected joint 4 times daily as needed. (Patient taking differently: Apply 2-4 g topically 4 (four) times daily as needed (joint pain).) 400 g 2   ezetimibe (ZETIA) 10 MG tablet Take 1 tablet (10 mg total) by mouth daily. 30 tablet 11   fexofenadine (ALLEGRA) 180 MG tablet Take 180 mg by mouth daily as needed for allergies (alternates with zyrtec sometimes).     fluticasone (FLONASE) 50 MCG/ACT nasal spray Place 2 sprays into both  nostrils daily as needed for allergies or rhinitis. (Patient taking differently: Place 2 sprays into both nostrils daily.) 48 g 3   furosemide (LASIX) 20 MG tablet Take 40 mg (2 tabs) 3 days per week. Take 20 mg the other days of the week. 90 tablet 3   hydroxypropyl methylcellulose / hypromellose (ISOPTO TEARS / GONIOVISC) 2.5 % ophthalmic solution Place 1 drop into both eyes at bedtime.     latanoprost (XALATAN) 0.005 % ophthalmic solution Place 1 drop into both eyes at bedtime.     levothyroxine (SYNTHROID) 50  MCG tablet Take 1 tablet (50 mcg total) by mouth daily. 90 tablet 1   magnesium oxide (MAG-OX) 400 MG tablet Take 1 tablet (400 mg total) by mouth in the morning, at noon, and at bedtime. (Patient taking differently: Take 400 mg by mouth 2 (two) times daily.) 270 tablet 3   methocarbamol (ROBAXIN) 500 MG tablet Take 1-2 tablets (500-1,000 mg total) by mouth every 6 (six) hours as needed for muscle spasms. 60 tablet 0   mometasone (ELOCON) 0.1 % lotion Apply 1 application topically daily as needed (rash). 60 mL 1   montelukast (SINGULAIR) 10 MG tablet TAKE 1 TABLET AT BEDTIME 90 tablet 3   Multiple Vitamins-Minerals (MULTIVITAMIN PO) Take 1 tablet by mouth daily.     nitroGLYCERIN (NITROSTAT) 0.4 MG SL tablet DISSOLVE ONE TABLET UNDER THE TONGUE EVERY 5 MINUTES AS NEEDED FOR CHEST PAIN.  DO NOT EXCEED A TOTAL OF 3 DOSES IN 15 MINUTES (Patient taking differently: Place 0.4 mg under the tongue every 5 (five) minutes as needed for chest pain.) 25 tablet 0   pantoprazole (PROTONIX) 40 MG tablet Take 1 tablet (40 mg total) by mouth daily. (Patient taking differently: Take 40 mg by mouth See admin instructions. Take 40 mg daily, may take a second 40 mg dose as needed for acid reflux) 30 tablet 11   phenol (CHLORASEPTIC) 1.4 % LIQD Use as directed 1 spray in the mouth or throat 3 (three) times daily as needed for throat irritation / pain. 20 mL 0   polyethylene glycol powder (GLYCOLAX/MIRALAX) powder  Take 17 grams by mouth daily 1080 g 0   potassium chloride (KLOR-CON M) 10 MEQ tablet TAKE 1/2 TABLET EVERY DAY (CALL OFFICE TO SCHEDULE YEARLY APPT) 30 tablet 11   Respiratory Therapy Supplies (FLUTTER) DEVI 1 Device by Does not apply route as needed. 1 each 0   sodium chloride HYPERTONIC 3 % nebulizer solution Take by nebulization 2 (two) times daily as needed for cough. 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 750 mL 12   traMADol (ULTRAM) 50 MG tablet Take 1 tablet (50 mg total) by mouth 2 (two) times daily. (Patient taking differently: Take 50 mg by mouth 2 (two) times daily as needed for moderate pain.) 180 tablet 1   traZODone (DESYREL) 50 MG tablet Take 0.5-1 tablets (25-50 mg total) by mouth at bedtime as needed for sleep. 60 tablet 0   zolpidem (AMBIEN) 5 MG tablet Take 1 tablet (5 mg total) by mouth at bedtime as needed for sleep. 90 tablet 0   aspirin EC 325 MG EC tablet Take 1 tablet (325 mg total) by mouth 2 (two) times daily. 30 tablet 0   aspirin EC 81 MG tablet Take 81 mg by mouth daily. Swallow whole. (Patient not taking: Reported on 09/12/2021)     HYDROcodone-acetaminophen (NORCO/VICODIN) 5-325 MG tablet Take 1 tablet by mouth every 6 (six) hours as needed for pain. (Patient not taking: Reported on 08/29/2021)     No facility-administered medications prior to visit.     Review of Systems:   Constitutional: No weight loss or gain, night sweats, fevers, chills, fatigue, or lassitude. HEENT: No headaches, difficulty swallowing, tooth/dental problems, or sore throat. No sneezing, itching, ear ache, nasal congestion, or post nasal drip. +hoarseness CV:  +intermittent chest pains (months; following with cardiology; recent EKG without t wave abnormalities and troponin in Oct nl); intermittent RLE edema (venous doppler negative for DVT). No orthopnea, PND, anasarca, dizziness, palpitations, syncope Resp: +shortness of breath with strenuous activity or  long distances, improved and back to baseline;  productive cough. No excess mucus or change in color of mucus. No hemoptysis. No wheezing.  No chest wall deformity GI:  No heartburn, indigestion, abdominal pain, nausea, vomiting, diarrhea, change in bowel habits, loss of appetite, bloody stools.  GU: No dysuria, change in color of urine, urgency or frequency.  No flank pain, no hematuria  Skin: No rash, lesions, ulcerations MSK:  No joint pain or swelling.  No decreased range of motion.  No back pain. Neuro: No dizziness or lightheadedness.  Psych: No depression or anxiety. Mood stable.     Physical Exam:  BP 128/70 (BP Location: Right Arm, Cuff Size: Normal)    Pulse 65    Temp 98.2 F (36.8 C) (Temporal)    Ht 5' 6.5" (1.689 m)    Wt 214 lb 6.4 oz (97.3 kg)    LMP  (LMP Unknown)    SpO2 94%    BMI 34.09 kg/m   GEN: Pleasant, interactive, well-appearing; obese; in no acute distress. HEENT:  Normocephalic and atraumatic. EACs patent bilaterally. TM pearly gray with present light reflex bilaterally. PERRLA. Sclera white. Nasal turbinates pink, moist and patent bilaterally. No rhinorrhea present. Oropharynx pink and moist, without exudate or edema. No lesions, ulcerations, or postnasal drip.  NECK:  Supple w/ fair ROM. No JVD present. Normal carotid impulses w/o bruits. Thyroid symmetrical with no goiter or nodules palpated. No lymphadenopathy.   CV: RRR, no m/r/g, no peripheral edema. Pulses intact, +2 bilaterally. No cyanosis, pallor or clubbing. PULMONARY:  Unlabored, regular breathing. Clear bilaterally A&P w/o wheezes/rales/rhonchi. No accessory muscle use. No dullness to percussion. GI: BS present and normoactive. Soft, non-tender to palpation. No organomegaly or masses detected. No CVA tenderness. MSK: No erythema, warmth or tenderness. Cap refil <2 sec all extrem. No deformities or joint swelling noted.  Neuro: A/Ox3. No focal deficits noted.   Skin: Warm, no lesions or rashe Psych: Normal affect and behavior. Judgement and thought  content appropriate.     Lab Results:  CBC    Component Value Date/Time   WBC 7.1 06/05/2021 0420   RBC 3.29 (L) 06/05/2021 0420   HGB 9.5 (L) 06/05/2021 0420   HGB 13.5 02/15/2021 1429   HGB 12.4 12/01/2014 0845   HCT 31.2 (L) 06/05/2021 0420   HCT 41.9 02/15/2021 1429   HCT 38.8 12/01/2014 0845   PLT 434 (H) 06/05/2021 0420   PLT 212 02/15/2021 1429   MCV 94.8 06/05/2021 0420   MCV 94 02/15/2021 1429   MCV 88.5 12/01/2014 0845   MCH 28.9 06/05/2021 0420   MCHC 30.4 06/05/2021 0420   RDW 14.1 06/05/2021 0420   RDW 11.9 02/15/2021 1429   RDW 12.7 12/01/2014 0845   LYMPHSABS 0.8 06/03/2021 0927   LYMPHSABS 0.8 02/15/2021 1429   LYMPHSABS 1.0 12/01/2014 0845   MONOABS 0.7 06/03/2021 0927   MONOABS 0.3 12/01/2014 0845   EOSABS 0.2 06/03/2021 0927   EOSABS 0.1 02/15/2021 1429   EOSABS 0.1 03/13/2007 1249   BASOSABS 0.1 06/03/2021 0927   BASOSABS 0.0 02/15/2021 1429   BASOSABS 0.0 12/01/2014 0845    BMET    Component Value Date/Time   NA 135 08/29/2021 1312   NA 139 08/07/2021 1449   NA 141 12/01/2014 0847   K 3.8 08/29/2021 1312   K 3.8 12/01/2014 0847   CL 98 08/29/2021 1312   CO2 30 08/29/2021 1312   CO2 26 12/01/2014 0847   GLUCOSE 104 (H) 08/29/2021 1312  GLUCOSE 175 (H) 12/01/2014 0847   BUN 19 08/29/2021 1312   BUN 10 08/07/2021 1449   BUN 10.6 12/01/2014 0847   CREATININE 0.89 08/29/2021 1312   CREATININE 0.82 02/27/2017 1518   CREATININE 0.8 12/01/2014 0847   CALCIUM 10.1 08/29/2021 1312   CALCIUM 9.8 12/01/2014 0847   GFRNONAA >60 06/07/2021 0359   GFRNONAA 74 02/27/2017 1518   GFRAA 82 10/01/2019 1408   GFRAA 85 02/27/2017 1518    BNP    Component Value Date/Time   BNP 24.6 06/03/2021 0958   BNP 30.2 08/16/2016 1452     Imaging:  DG Chest 2 View  Result Date: 08/29/2021 CLINICAL DATA:  Increased cough and shortness of breath. EXAM: CHEST - 2 VIEW COMPARISON:  Chest radiographs 06/26/2021 and earlier FINDINGS: A single lead ICD  remains in place. The cardiomediastinal silhouette is unchanged with borderline to mild cardiac enlargement. Peribronchial thickening and diffuse interstitial prominence throughout both lungs are similar to radiographs from 05/17/2021. No definite acute airspace consolidation, pleural effusion, pneumothorax is identified. No acute osseous abnormality is seen. IMPRESSION: Similar appearance of chronic lung findings which could potentially reflect interstitial lung disease. No convincing acute findings. Electronically Signed   By: Logan Bores M.D.   On: 08/29/2021 13:38   CT Angio Chest Pulmonary Embolism (PE) W or WO Contrast  Result Date: 08/30/2021 CLINICAL DATA:  Severe and worsening shortness of breath. Wheezing and cough over the last week. EXAM: CT ANGIOGRAPHY CHEST WITH CONTRAST TECHNIQUE: Multidetector CT imaging of the chest was performed using the standard protocol during bolus administration of intravenous contrast. Multiplanar CT image reconstructions and MIPs were obtained to evaluate the vascular anatomy. RADIATION DOSE REDUCTION: This exam was performed according to the departmental dose-optimization program which includes automated exposure control, adjustment of the mA and/or kV according to patient size and/or use of iterative reconstruction technique. CONTRAST:  66m OMNIPAQUE IOHEXOL 350 MG/ML SOLN COMPARISON:  Chest radiography yesterday.  Chest CT 06/03/2021 FINDINGS: Cardiovascular: Mild cardiomegaly as seen previously. Small amount of coronary artery calcification and aortic atherosclerotic calcification. Pulmonary arterial opacification is good. Main pulmonary arteries are prominent suggesting pulmonary arterial hypertension. There are no central pulmonary emboli. No visible peripheral pulmonary emboli, though detail is somewhat limited by breathing motion in the smaller vessels. Pacemaker/AICD in place. Mediastinum/Nodes: No mediastinal or hilar mass or lymphadenopathy. Lungs/Pleura:  Bronchiectasis again noted in the lower lungs. Emphysematous changes throughout the lungs. Fibrotic changes with peripheral prominence. No sign of acute infiltrate, collapse or effusion. Upper Abdomen: Negative Musculoskeletal: Chronic degenerative changes affect the spine with ankylosis. Review of the MIP images confirms the above findings. IMPRESSION: No pulmonary emboli. Cardiomegaly. Pacemaker/AICD. Prominent main pulmonary arteries consistent with pulmonary arterial hypertension. Some coronary artery calcification. Aortic atherosclerotic calcification. Chronic interstitial lung disease with bronchiectasis, emphysematous changes, architectural distortion and fibrotic changes. Differential diagnosis is postinfectious/postinflammatory, sarcoid and hypersensitivity pneumonitis. Findings are not typical of UIP. No identifiable acute pneumonia, collapse or pleural effusion. Electronically Signed   By: MNelson ChimesM.D.   On: 08/30/2021 08:04   CUP PACEART REMOTE DEVICE CHECK  Result Date: 08/30/2021 Scheduled remote reviewed. Normal device function.  9 NSVT, no therapies Next remote 91 days. LA  VAS [place]OWER EXTREMITY VENOUS (DVT)  Result Date: 08/30/2021  Lower Venous DVT Study Patient Name:  BCHARLES NIESE Date of Exam:   08/30/2021 Medical Rec #: [id number]  Accession #:    [id number] Date of Birth: [birth year]                   Patient Gender: F Patient Age:   40 years Exam Location:  [facility] Procedure:      VAS [place] LOWER EXTREMITY VENOUS (DVT) Referring Phys: Belenda Cruise Zakari Bathe --------------------------------------------------------------------------------  Indications: Swelling of right lower leg since total knee arthroplasty on 05/22/21. Patient reports no unusual chest pain or shortness of breath.  Risk Factors: Surgery right total knee arthroplasty on 05/22/21. Anticoagulation: 81 mg of aspirin.  Comparison Study: NA Performing Technologist: Leavy Cella RDCS Supporting  Technologist: Wilkie Aye RVT  Examination Guidelines: A complete evaluation includes B-mode imaging, spectral Doppler, color Doppler, and power Doppler as needed of all accessible portions of each vessel. Bilateral testing is considered an integral part of a complete examination. Limited examinations for reoccurring indications may be performed as noted. The reflux portion of the exam is performed with the patient in reverse Trendelenburg.  +---------+---------------+---------+-----------+----------+--------------+  RIGHT     Compressibility Phasicity Spontaneity Properties Thrombus Aging  +---------+---------------+---------+-----------+----------+--------------+  CFV       Full            Yes       Yes                                    +---------+---------------+---------+-----------+----------+--------------+  SFJ       Full            Yes       Yes                                    +---------+---------------+---------+-----------+----------+--------------+  FV Prox   Full            Yes       Yes                                    +---------+---------------+---------+-----------+----------+--------------+  FV Mid    Full            Yes       Yes                                    +---------+---------------+---------+-----------+----------+--------------+  FV Distal Full            Yes       Yes                                    +---------+---------------+---------+-----------+----------+--------------+  PFV       Full            Yes       Yes                                    +---------+---------------+---------+-----------+----------+--------------+  POP       Full            Yes       Yes                                    +---------+---------------+---------+-----------+----------+--------------+  PTV       Full            Yes       Yes                                    +---------+---------------+---------+-----------+----------+--------------+  PERO      Full            Yes       Yes                                     +---------+---------------+---------+-----------+----------+--------------+  Gastroc   Full                                                             +---------+---------------+---------+-----------+----------+--------------+  GSV       Full            Yes       Yes                                    +---------+---------------+---------+-----------+----------+--------------+   +----+---------------+---------+-----------+----------+--------------+  LEFT Compressibility Phasicity Spontaneity Properties Thrombus Aging  +----+---------------+---------+-----------+----------+--------------+  CFV  Full            Yes       Yes                                    +----+---------------+---------+-----------+----------+--------------+    Findings reported to [facility] [place] at 2:57pm.  Summary: RIGHT: - No evidence of deep vein thrombosis in the lower extremity. No indirect evidence of obstruction proximal to the inguinal ligament. - No cystic structure found in the popliteal fossa.  LEFT: - No evidence of common femoral vein obstruction.  *See table(s) above for measurements and observations. Electronically signed by Quay Burow MD on 08/30/2021 at 5:43:40 PM.    Final       PFT Results Latest Ref Rng & Units 10/16/2018 01/17/2017  FVC-Pre L 2.52 2.63  FVC-Predicted Pre % 96 98  FVC-Post L 2.49 2.52  FVC-Predicted Post % 94 94  Pre FEV1/FVC % % 90 90  Post FEV1/FCV % % 91 92  FEV1-Pre L 2.27 2.38  FEV1-Predicted Pre % 111 113  FEV1-Post L 2.26 2.32  DLCO uncorrected ml/min/mmHg 12.43 15.90  DLCO UNC% % 59 58  DLCO corrected ml/min/mmHg - 15.80  DLCO COR %Predicted % - 57  DLVA Predicted % 85 80  TLC L 4.00 3.85  TLC % Predicted % 74 71  RV % Predicted % 58 55    No results found for: NITRICOXIDE      Assessment & Plan:   Bronchiectasis without complication (HCC) Improved symptoms. Continue mucociliary clearance with mucinex, hypertonic nebs, vest therapy, and flutter valve.  Continue Symbicort Twice daily and PRN albuterol. Recent CTA with unchange BTX.   Patient Instructions  -Continue Albuterol inhaler 2 puffs or 3 mL neb every 6 hours as needed for shortness of breath or wheezing. Notify if symptoms persist  despite rescue inhaler/neb use. -Continue Symbicort 2 puffs Twice daily. Brush tongue and rinse mouth afterwards  -Continue flonase nasal spray 1-2 sprays each nostril daily -Continue allegra 180 mg daily -Continue singulair 10 mg At bedtime  -Continue protonix 40 mg daily  -Continue mucinex 600 mg Twice daily  -Continue flutter valve -Continue nasal irrigations Twice daily  -Continue Astelin nasal spray 1 spray each nostril Twice daily  -Continue supplemental oxygen at 2 lpm with activity for oxygen saturations >88-90%   -Start hypertonic saline nebs Twice daily until symptoms improve then as needed for cough. Will send through medical equipment company -Use saline nasal gel in each nostril At bedtime    Continue to use CPAP every night, minimum of 4-6 hours a night.  Change equipment every 30 days or as directed by DME. Wash your tubing with warm soap and water daily, hang to dry. Wash humidifier portion weekly.  Maintain clean equipment, as directed by home health agency.  Be aware of reduced alertness and do not drive or operate heavy machinery if experiencing this or drowsiness.  Exercise encouraged, as tolerated. Healthy weight management discussed.  Avoid or decrease alcohol consumption and medications that make you more sleepy, if possible. Notify if persistent daytime sleepiness occurs even with consistent use of CPAP.   Follow up in with [name] or Roxan Diesel, NP in three months. If symptoms do not improve or worsen, please contact office for sooner follow up or seek emergency care.   Obstructive sleep apnea Continue CPAP therapy. Advised saline nasal gel At bedtime for dry mouth. Requested SD card for download.  Allergic  rhinitis Improvement with Astelin nasal spray. Suspect hoarseness from postnasal drip. Continue Astelin, flonase, Singulair, and Zyrtec  Localized swelling of right lower leg Neg for DVT. Advised follow up with ortho.  Atypical chest pain Ongoing for months since COVID per pt with neg troponins and EKG. No current chest pain or tightness or other cardiac symptoms. Has followed up scheduled with cardiology for further evaluation. ED precautions discussed.   Clayton Bibles, NP 09/12/2021  Pt aware and understands NP's role.

## 2021-09-12 NOTE — Assessment & Plan Note (Signed)
Improved symptoms. Continue mucociliary clearance with mucinex, hypertonic nebs, vest therapy, and flutter valve. Continue Symbicort Twice daily and PRN albuterol. Recent CTA with unchange BTX.   Patient Instructions  -Continue Albuterol inhaler 2 puffs or 3 mL neb every 6 hours as needed for shortness of breath or wheezing. Notify if symptoms persist despite rescue inhaler/neb use. -Continue Symbicort 2 puffs Twice daily. Brush tongue and rinse mouth afterwards  -Continue flonase nasal spray 1-2 sprays each nostril daily -Continue allegra 180 mg daily -Continue singulair 10 mg At bedtime  -Continue protonix 40 mg daily  -Continue mucinex 600 mg Twice daily  -Continue flutter valve -Continue nasal irrigations Twice daily  -Continue Astelin nasal spray 1 spray each nostril Twice daily  -Continue supplemental oxygen at 2 lpm with activity for oxygen saturations >88-90%   -Start hypertonic saline nebs Twice daily until symptoms improve then as needed for cough. Will send through medical equipment company -Use saline nasal gel in each nostril At bedtime    Continue to use CPAP every night, minimum of 4-6 hours a night.  Change equipment every 30 days or as directed by DME. Wash your tubing with warm soap and water daily, hang to dry. Wash humidifier portion weekly.  Maintain clean equipment, as directed by home health agency.  Be aware of reduced alertness and do not drive or operate heavy machinery if experiencing this or drowsiness.  Exercise encouraged, as tolerated. Healthy weight management discussed.  Avoid or decrease alcohol consumption and medications that make you more sleepy, if possible. Notify if persistent daytime sleepiness occurs even with consistent use of CPAP.   Follow up in with Dr. Halford Chessman or Roxan Diesel, NP in three months. If symptoms do not improve or worsen, please contact office for sooner follow up or seek emergency care.

## 2021-09-13 ENCOUNTER — Other Ambulatory Visit: Payer: Self-pay

## 2021-09-13 MED ORDER — SODIUM CHLORIDE 3 % IN NEBU: INHALATION_SOLUTION | Freq: Two times a day (BID) | RESPIRATORY_TRACT | 6 refills | Status: DC

## 2021-09-13 NOTE — Addendum Note (Signed)
Addended by: Mathis Bud on: 09/13/2021 09:12 AM   Modules accepted: Orders

## 2021-09-14 NOTE — Progress Notes (Signed)
Patient ID: [name], female    DOB: 09-15-1950  MRN: [id number]  CC: Urinary Symptoms   Subjective: [name] is a 71 y.o. female who presents for urinary symptoms.   Her concerns today include:  Reports ongoing discomfort with urination. States there is a sensation that goes through her body to her fingertips only with urination. In the past had this sensation when she had a urinary tract infection. Reports urinary leakage happens with or without activity. Wearing pads as a support. Reports she is not sexually active. Stomach tenderness on right side. Denies nausea, vomiting, and additional red flag symptoms.  Patient Active Problem List   Diagnosis Date Noted   Localized swelling of right lower leg 08/29/2021   Tonsillar exudate 08/29/2021   Candida vaginitis 08/28/2021   Chronic pain syndrome 08/25/2021   Multifocal pneumonia 06/03/2021   Pre-operative respiratory examination 05/18/2021   Statin myopathy 04/19/2021   Pain in joint of right shoulder 02/10/2021   Arthritis of both acromioclavicular joints 01/02/2021   Trochanteric bursitis of right hip 10/03/2020   Tailbone injury 05/05/2020   UTI (urinary tract infection) 05/05/2020   Insomnia 05/05/2020   Leg cramps 02/16/2020   Dysuria 02/16/2020   Right hip impingement syndrome 02/01/2020   Right bicipital tenosynovitis 02/01/2020   Bronchiectasis without complication ([facility] [place]) 83/66/2947   Myofascial pain 12/21/2019   Estrogen deficiency 12/10/2019   Obesity (BMI 30-39.9) 12/10/2019   Bronchiectasis with (acute) exacerbation ([place]) 10/23/2019   Elevated LFTs 10/15/2019   Lower abdominal pain 09/01/2019   Neck pain 10/25/2017   Paresthesias in left hand 10/25/2017   Paresthesia of foot, bilateral 04/12/2017   Primary osteoarthritis of both hands 09/27/2016   Primary osteoarthritis of both knees 09/27/2016   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   Nasopharyngitis, 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/2016   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   Chronic pain of both knees 12/06/2015   Globus pharyngeus 05/17/2015   Genetic testing 12/23/2014   Family history of breast cancer    Family history of colon cancer    Family history of pancreatic cancer    History of breast cancer 11/23/2014   Allergy to ACE inhibitors 09/27/2014   Prediabetes 06/23/2013   RLS (restless legs syndrome) 01/15/2013   Hx of Clostridium difficile infection 10/10/2012   Dyspnea on exertion 04/02/2012   NICM (nonischemic cardiomyopathy) ([place]) 03/07/2012   Post-menopausal 01/11/2012   Atypical chest pain 11/07/2011   Obstructive sleep apnea 04/04/2010   V-tach 01/03/2009   ICD  [employer]  Single chamber 01/03/2009   PFO (patent foramen ovale) 09/29/2008   Vitamin D deficiency 08/17/2008   Hypothyroidism 11/18/2006   Hyperlipidemia 11/18/2006   Depression with anxiety 11/18/2006   Essential hypertension 11/18/2006   Mitral valve prolapse 11/18/2006   Cerebral aneurysm 11/18/2006   Allergic rhinitis 11/18/2006   GERD 11/18/2006   Diverticulosis of colon 11/18/2006   Fatty liver 11/18/2006   Fibromyalgia 11/18/2006     Current Outpatient Medications on File Prior to Visit  Medication Sig Dispense Refill   acetaminophen (TYLENOL) 500 MG tablet Take 500 mg by mouth every 6 (six) hours as needed for moderate pain.     albuterol (PROVENTIL) (2.5 MG/3ML) 0.083% nebulizer solution USE 1 VIAL IN NEBULIZER EVERY 6 HOURS (Patient taking differently: Take 2.5 mg by nebulization every 6 (six) hours as needed for shortness of breath.) 120 mL 11   albuterol (VENTOLIN HFA) 108 (90 Base) MCG/ACT inhaler Inhale 2 puffs into the lungs every 6 (  six) hours as needed for wheezing or shortness of breath. 18 g 3   Ascorbic Acid (VITAMIN C) 1000 MG tablet Take 1,000 mg by mouth daily.     aspirin EC 81 MG tablet Take 81 mg by mouth daily. Swallow whole. (Patient not taking: Reported on 09/12/2021)     azelastine (ASTELIN) 0.1 %  nasal spray Place 1 spray into both nostrils 2 (two) times daily. Use in each nostril as directed 30 mL 2   baclofen (LIORESAL) 10 MG tablet Take 1 tablet (10 mg total) by mouth at bedtime as needed for muscle spasms. 90 tablet 1   calcium carbonate (OS-CAL - DOSED IN MG OF ELEMENTAL CALCIUM) 1250 (500 Ca) MG tablet Take 1 tablet by mouth daily.     carvedilol (COREG) 12.5 MG tablet Take 1 tablet (12.5 mg total) by mouth 2 (two) times daily. 180 tablet 3   cetirizine (ZYRTEC) 10 MG tablet Take 10 mg by mouth daily as needed for allergies.     Cholecalciferol (DIALYVITE VITAMIN D 5000) 125 MCG (5000 UT) capsule Take 5,000 Units by mouth daily.     diclofenac Sodium (VOLTAREN) 1 % GEL Apply 2-4 grams to affected joint 4 times daily as needed. (Patient taking differently: Apply 2-4 g topically 4 (four) times daily as needed (joint pain).) 400 g 2   ezetimibe (ZETIA) 10 MG tablet Take 1 tablet (10 mg total) by mouth daily. 30 tablet 11   fexofenadine (ALLEGRA) 180 MG tablet Take 180 mg by mouth daily as needed for allergies (alternates with zyrtec sometimes).     fluticasone (FLONASE) 50 MCG/ACT nasal spray Place 2 sprays into both nostrils daily as needed for allergies or rhinitis. (Patient taking differently: Place 2 sprays into both nostrils daily.) 48 g 3   furosemide (LASIX) 20 MG tablet Take 40 mg (2 tabs) 3 days per week. Take 20 mg the other days of the week. 90 tablet 3   HYDROcodone-acetaminophen (NORCO/VICODIN) 5-325 MG tablet Take 1 tablet by mouth every 6 (six) hours as needed for pain. (Patient not taking: Reported on 08/29/2021)     hydroxypropyl methylcellulose / hypromellose (ISOPTO TEARS / GONIOVISC) 2.5 % ophthalmic solution Place 1 drop into both eyes at bedtime.     latanoprost (XALATAN) 0.005 % ophthalmic solution Place 1 drop into both eyes at bedtime.     levothyroxine (SYNTHROID) 50 MCG tablet Take 1 tablet (50 mcg total) by mouth daily. 90 tablet 1   magnesium oxide (MAG-OX) 400  MG tablet Take 1 tablet (400 mg total) by mouth in the morning, at noon, and at bedtime. (Patient taking differently: Take 400 mg by mouth 2 (two) times daily.) 270 tablet 3   methocarbamol (ROBAXIN) 500 MG tablet Take 1-2 tablets (500-1,000 mg total) by mouth every 6 (six) hours as needed for muscle spasms. 60 tablet 0   mometasone (ELOCON) 0.1 % lotion Apply 1 application topically daily as needed (rash). 60 mL 1   montelukast (SINGULAIR) 10 MG tablet TAKE 1 TABLET AT BEDTIME 90 tablet 3   Multiple Vitamins-Minerals (MULTIVITAMIN PO) Take 1 tablet by mouth daily.     nitroGLYCERIN (NITROSTAT) 0.4 MG SL tablet DISSOLVE ONE TABLET UNDER THE TONGUE EVERY 5 MINUTES AS NEEDED FOR CHEST PAIN.  DO NOT EXCEED A TOTAL OF 3 DOSES IN 15 MINUTES (Patient taking differently: Place 0.4 mg under the tongue every 5 (five) minutes as needed for chest pain.) 25 tablet 0   pantoprazole (PROTONIX) 40 MG tablet Take 1  tablet (40 mg total) by mouth daily. (Patient taking differently: Take 40 mg by mouth See admin instructions. Take 40 mg daily, may take a second 40 mg dose as needed for acid reflux) 30 tablet 11   phenol (CHLORASEPTIC) 1.4 % LIQD Use as directed 1 spray in the mouth or throat 3 (three) times daily as needed for throat irritation / pain. 20 mL 0   polyethylene glycol powder (GLYCOLAX/MIRALAX) powder Take 17 grams by mouth daily 1080 g 0   potassium chloride (KLOR-CON M) 10 MEQ tablet TAKE 1/2 TABLET EVERY DAY (CALL OFFICE TO SCHEDULE YEARLY APPT) 30 tablet 11   Respiratory Therapy Supplies (FLUTTER) DEVI 1 Device by Does not apply route as needed. 1 each 0   sodium chloride HYPERTONIC 3 % nebulizer solution Take by nebulization 2 (two) times daily as needed for cough. 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 750 mL 12   sodium chloride HYPERTONIC 3 % nebulizer solution Take by nebulization in the morning and at bedtime. 750 mL 6   traMADol (ULTRAM) 50 MG tablet Take 1 tablet (50 mg total) by mouth 2 (two) times daily.  (Patient taking differently: Take 50 mg by mouth 2 (two) times daily as needed for moderate pain.) 180 tablet 1   traZODone (DESYREL) 50 MG tablet Take 0.5-1 tablets (25-50 mg total) by mouth at bedtime as needed for sleep. 60 tablet 0   zolpidem (AMBIEN) 5 MG tablet Take 1 tablet (5 mg total) by mouth at bedtime as needed for sleep. 90 tablet 0   No current facility-administered medications on file prior to visit.    Allergies  Allergen Reactions   Ace Inhibitors Swelling    Angioedema; makes tongue "break out"    Amitriptyline Hcl Other (See Comments)     makes her too sleepy!   Atorvastatin Other (See Comments)    SEVERE MYALGIA   Cymbalta [Duloxetine Hcl] Nausea Only and Other (See Comments)    Sleepiness/ sick   Dilantin [Phenytoin Sodium Extended] Rash    Severe rash   Paroxetine Nausea Only    Rapid heartbeat   Ramipril Other (See Comments)    TONGUE ULCERS    Rosuvastatin Other (See Comments)    SEVERE MYALGIA   Carbamazepine Rash   Codeine Itching   Paxlovid [Nirmatrelvir-Ritonavir] Itching   Phenytoin Sodium Extended Rash   Simvastatin Other (See Comments)    Increase in CK, myalgias   Wellbutrin [Bupropion] Palpitations    Social History   Socioeconomic History   Marital status: Divorced    Spouse name: Not on file   Number of children: 2   Years of education: 14   Highest education level: Not on file  Occupational History   Occupation: DISABLED  Tobacco Use   Smoking status: Former    Packs/day: 1.00    Years: 15.00    Pack years: 15.00    Types: Cigarettes    Quit date: 08/20/1989    Years since quitting: 32.0   Smokeless tobacco: Never   Tobacco comments:    Started at 23; less than 1 PPD  Vaping Use   Vaping Use: Never used  Substance and Sexual Activity   Alcohol use: Yes    Comment: red wine nightly   Drug use: Not Currently    Types: Marijuana    Comment: CBD gummy occ   Sexual activity: Not Currently  Other Topics Concern   Not on  file  Social History Narrative   Lives alone   Caffeine LFY:BOFB  Retired from [employer]   2 children -- 5 grandbabies   Live alone   Social Determinants of Radio broadcast assistant Strain: Low Risk    Difficulty of Paying Living Expenses: Not hard at [employer] Insecurity: No Food Insecurity   Worried About Charity fundraiser in the Last Year: Never true   Arboriculturist in the Last Year: Never true  Transportation Needs: No Transportation Needs   Lack of Transportation (Medical): No   Lack of Transportation (Non-Medical): No  Physical Activity: Inactive   Days of Exercise per Week: 0 days   Minutes of Exercise per Session: 0 min  Stress: No Stress Concern Present   Feeling of Stress : Not at all  Social Connections: Moderately Isolated   Frequency of Communication with Friends and Family: More than three times a week   Frequency of Social Gatherings with Friends and Family: More than three times a week   Attends Religious Services: 1 to 4 times per year   Active Member of Clubs or Organizations: No   Attends Archivist Meetings: Never   Marital Status: Divorced  Human resources officer Violence: Not At Risk   Fear of Current or Ex-Partner: No   Emotionally Abused: No   Physically Abused: No   Sexually Abused: No    Family History  Problem Relation Age of Onset   Diabetes Mother    Alzheimer's disease Mother    Hypertension Mother    Obesity Mother    Other Brother        Thyroid problem 10/2016   Allergies Sister    Heart disease Sister    Heart disease Brother    Prostate cancer Brother 65       same brother as throat cancer   Throat cancer Brother        dx in his 71s; also a smoker   Heart disease Father    Cancer Maternal Grandmother 65       colon cancer or abdominal cancer   Lung cancer Maternal Grandfather 78   Breast cancer Maternal Aunt 55   Colon cancer Maternal Aunt 61       same sister as breast at 2   Lung cancer Maternal Uncle     Breast cancer Maternal Aunt        dx in her 84s   Pancreatic cancer Cousin 60       maternal first cousin   Breast cancer Cousin        paternal first cousin twice removed died in her 20s   Heart disease Son        Cardiac Arrest 07/2016   Stroke Neg Hx     Past Surgical History:  Procedure Laterality Date   ABI  2006   normal   BREAST EXCISIONAL BIOPSY     BREAST LUMPECTOMY WITH RADIOACTIVE SEED LOCALIZATION Right 01/03/2015   Procedure: BREAST LUMPECTOMY WITH RADIOACTIVE SEED LOCALIZATION;  Surgeon: Autumn Messing III, MD;  Location: [facility];  Service: General;  Laterality: Right;   CARDIAC DEFIBRILLATOR PLACEMENT  2006; 2012   BSX single chamber ICD   carotid dopplers  2006   neg   CEREBRAL ANEURYSM REPAIR  02/1999   COLONOSCOPY     HAMMER TOE SURGERY  11/15/2020   3rd digit bilateral feet    hospitalization  2004   GI bleed, PUD, diverticulosis (EGD,colonscopy)   hospitalization     PE, NSVT, s/p defib   KNEE  ARTHROSCOPY     bilateral   LEFT HEART CATHETERIZATION WITH CORONARY ANGIOGRAM N/A 02/22/2012   Procedure: LEFT HEART CATHETERIZATION WITH CORONARY ANGIOGRAM;  Surgeon: Burnell Blanks, MD;  Location: [facility] CATH LAB;  Service: Cardiovascular;  Laterality: N/A;   PARTIAL HYSTERECTOMY     Fibroids   TONGUE BIOPSY  12/12/2017   due to sore tongue and white patches/abnormal cells   TOTAL KNEE ARTHROPLASTY Left 02/13/2016   Procedure: TOTAL KNEE ARTHROPLASTY;  Surgeon: Vickey Huger, MD;  Location: [facility];  Service: Orthopedics;  Laterality: Left;   TOTAL KNEE ARTHROPLASTY Right 05/22/2021   Procedure: TOTAL KNEE ARTHROPLASTY;  Surgeon: Vickey Huger, MD;  Location: [facility];  Service: Orthopedics;  Laterality: Right;  with block   UPPER GASTROINTESTINAL ENDOSCOPY      ROS: Review of Systems Negative except as stated above  PHYSICAL EXAM: BP 111/74 (BP Location: Left Arm, Patient Position: Sitting, Cuff Size: Large)    Pulse 70    Temp 98.3 F (36.8 C)    Resp 18    Ht 5'  6.5" (1.689 m)    Wt 205 lb (93 kg)    LMP  (LMP Unknown)    SpO2 96%    BMI 32.60 kg/m    Physical Exam HENT:     Head: Normocephalic and atraumatic.  Eyes:     Extraocular Movements: Extraocular movements intact.     Conjunctiva/sclera: Conjunctivae normal.     Pupils: Pupils are equal, round, and reactive to light.  Cardiovascular:     Rate and Rhythm: Normal rate and regular rhythm.     Pulses: Normal pulses.     Heart sounds: Normal heart sounds.  Pulmonary:     Effort: Pulmonary effort is normal.     Breath sounds: Normal breath sounds.  Abdominal:     Tenderness: There is abdominal tenderness.  Musculoskeletal:     Cervical back: Normal range of motion and neck supple.  Neurological:     General: No focal deficit present.     Mental Status: She is alert and oriented to person, place, and time.  Psychiatric:        Mood and Affect: Mood normal.        Behavior: Behavior normal.   Results for orders placed or performed in visit on 09/15/21  POCT URINALYSIS DIP ([facility])  Result Value Ref Range   Color, UA yellow yellow   Clarity, UA clear clear   Glucose, UA negative negative mg/dL   Bilirubin, UA negative negative   Ketones, POC UA negative negative mg/dL   Spec Grav, UA 1.025 1.010 - 1.025   Blood, UA trace-intact (A) negative   pH, UA 5.5 5.0 - 8.0   POC PROTEIN,UA negative negative, trace   Urobilinogen, UA 1.0 0.2 or 1.0 E.U./dL   Nitrite, UA Negative Negative   Leukocytes, UA Trace (A) Negative    ASSESSMENT AND PLAN: 1. Urinary incontinence, unspecified type: 2. Frequency of urination: - Urinalysis today in office without nitrites, trace blood, and trace leukocytes. Sending urine culture for further evaluation.  - Update cervicovaginal screening.  - Referral to Urogynecology for further evaluation and management.  - POCT URINALYSIS DIP ([facility]) - Cervicovaginal ancillary only - Ambulatory referral to Urogynecology - Urine Culture   Patient was  given the opportunity to ask questions.  Patient verbalized understanding of the plan and was able to repeat key elements of the plan. Patient was given clear instructions to go to Emergency Department or return to  medical center if symptoms don't improve, worsen, or new problems develop.The patient verbalized understanding.   Orders Placed This Encounter  Procedures   Urine Culture   Ambulatory referral to Urogynecology   POCT URINALYSIS DIP ([facility]K)    Follow-up with primary provider as scheduled.   Camillia Herter, NP

## 2021-09-15 ENCOUNTER — Other Ambulatory Visit: Payer: Self-pay

## 2021-09-15 ENCOUNTER — Other Ambulatory Visit (HOSPITAL_COMMUNITY)
Admission: RE | Admit: 2021-09-15 | Discharge: 2021-09-15 | Disposition: A | Discharge status: Home / Self Care | Payer: Medicare Other | Source: Ambulatory Visit | Attending: Family | Admitting: Family

## 2021-09-15 ENCOUNTER — Ambulatory Visit (INDEPENDENT_AMBULATORY_CARE_PROVIDER_SITE_OTHER): Payer: Medicare Other | Admitting: Family

## 2021-09-15 ENCOUNTER — Encounter: Payer: Self-pay | Admitting: Family

## 2021-09-15 ENCOUNTER — Encounter: Payer: Self-pay | Admitting: Pulmonary Disease

## 2021-09-15 VITALS — BP 111/74 | HR 70 | Temp 98.3°F | Resp 18 | Ht 66.5 in | Wt 205.0 lb

## 2021-09-15 DIAGNOSIS — J301 Allergic rhinitis due to pollen: Secondary | ICD-10-CM

## 2021-09-15 DIAGNOSIS — E038 Other specified hypothyroidism: Secondary | ICD-10-CM

## 2021-09-15 DIAGNOSIS — R350 Frequency of micturition: Secondary | ICD-10-CM | POA: Insufficient documentation

## 2021-09-15 DIAGNOSIS — R32 Unspecified urinary incontinence: Secondary | ICD-10-CM

## 2021-09-15 DIAGNOSIS — G4700 Insomnia, unspecified: Secondary | ICD-10-CM

## 2021-09-15 LAB — POCT URINALYSIS DIP (CLINITEK)
Bilirubin, UA: NEGATIVE
Glucose, UA: NEGATIVE mg/dL
Ketones, POC UA: NEGATIVE mg/dL
Nitrite, UA: NEGATIVE
POC PROTEIN,UA: NEGATIVE
Spec Grav, UA: 1.025 (ref 1.010–1.025)
Urobilinogen, UA: 1 E.U./dL
pH, UA: 5.5 (ref 5.0–8.0)

## 2021-09-15 MED ORDER — LEVOTHYROXINE SODIUM 50 MCG PO TABS: 50.0000 ug | ORAL_TABLET | Freq: Every day | ORAL | 1 refills | Status: DC

## 2021-09-15 MED ORDER — BUDESONIDE-FORMOTEROL FUMARATE 80-4.5 MCG/ACT IN AERO: INHALATION_SPRAY | RESPIRATORY_TRACT | 6 refills | Status: DC

## 2021-09-15 MED ORDER — FLUTICASONE PROPIONATE 50 MCG/ACT NA SUSP: 2.0000 | Freq: Every day | NASAL | 3 refills | Status: DC | PRN

## 2021-09-15 MED ORDER — TRAZODONE HCL 50 MG PO TABS: 25.0000 mg | ORAL_TABLET | Freq: Every evening | ORAL | 0 refills | Status: DC | PRN

## 2021-09-15 NOTE — Progress Notes (Signed)
Pt presents for possible UTI, pt complains of frequent urination

## 2021-09-15 NOTE — Progress Notes (Signed)
Urinalysis discussed in office.

## 2021-09-17 LAB — URINE CULTURE

## 2021-09-18 ENCOUNTER — Encounter: Payer: Self-pay | Admitting: Pulmonary Disease

## 2021-09-18 LAB — CERVICOVAGINAL ANCILLARY ONLY
Bacterial Vaginitis (gardnerella): NEGATIVE
Candida Glabrata: NEGATIVE
Candida Vaginitis: NEGATIVE
Chlamydia: NEGATIVE
Comment: NEGATIVE
Comment: NEGATIVE
Comment: NEGATIVE
Comment: NEGATIVE
Comment: NEGATIVE
Comment: NORMAL
Neisseria Gonorrhea: NEGATIVE
Trichomonas: NEGATIVE

## 2021-09-18 NOTE — Progress Notes (Signed)
Urine culture normal.

## 2021-09-18 NOTE — Progress Notes (Signed)
Gonorrhea, Chlamydia, Trichomonas, Bacterial Vaginitis, and Candida Vaginitis (sometimes called a yeast infection) negative.

## 2021-09-21 NOTE — Progress Notes (Signed)
Cardiology Office Note   Date:  09/22/2021   ID:  [name], [name] March 12, 1951, MRN [id number]  PCP:  Camillia Herter, NP    Chief Complaint  Patient presents with   Follow-up   NICM  Wt Readings from Last 3 Encounters:  09/22/21 211 lb (95.7 kg)  09/15/21 205 lb (93 kg)  09/12/21 214 lb 6.4 oz (97.3 kg)       History of Present Illness: [name] is a 71 y.o. female   who had a nonischemic cardiomyopathy.  In 2013, ejection fraction was listed at 35 to 40%.  Most recent echocardiogram in 2018 that showed that her LVEF has normalized.  She did have a defibrillator placed for ventricular tachycardia.   No CAD by cath in approximately 2006.  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    She had a CXR and there was a question of enlarged heart.     She was having some palpitations, and Zio patch was ok in 11/2019: "Symptoms of flutters assoc mostly with PVCs  Symptoms of flutters also assoc with sinus S VT Nonsustained 6 episodes; fastest 141 bpm for 6 beats; longest for 7 beats at 100 bpm "   She received her COVID vaccines.    Walking has been limited by knee pain.  She has used Lasix prn.     EF 2018 showed normal LVEF.   She had right total knee arthroplasty on 05/22/21. On 06/03/21, she was admitted to [facility] for evaluation of shortness of breath. CT was negative for PE. She was treated for possible pneumonia/chronic interstitial lung disease/respiratory bronchiolitis. She took prednisone. Is using home O2 when she is very active.    She presented in 05/2021 for evaluation of elevated HR with activity. She called 1 month ago due to concerns with HR going up to 134 when she stood up from her seat to go to the bathroom. She has continued to notice HR in high 90's bpm with minimal activity.   Echo in 07/2021 showed: "Left ventricular ejection fraction, by estimation, is 50 to 55%. The  left ventricle has low normal function.  The left ventricle has no regional  wall motion abnormalities. There is mild concentric left ventricular  hypertrophy. Left ventricular  diastolic parameters are indeterminate.   2. Right ventricular systolic function is normal. The right ventricular  size is normal. There is mildly elevated pulmonary artery systolic  pressure. The estimated right ventricular systolic pressure is 56.3 mmHg.   3. Left atrial size was mildly dilated.   4. Right atrial size was mildly dilated.   5. The mitral valve is grossly normal. Trivial mitral valve  regurgitation. No evidence of mitral stenosis.   6. The aortic valve is tricuspid. Aortic valve regurgitation is not  visualized. No aortic stenosis is present.   7. The inferior vena cava is normal in size with greater than 50%  respiratory variability, suggesting right atrial pressure of 3 mmHg.   8. Intra atrial shunt cannot be excluded. "  She has felt some mild chest pain in the middle of her chest.  Occurred since COVID/pneumonia in 10/22.  Finished therapy for her knee.  CP not related to exertion.  Happens randomly and resolves spontaneously.   Denies : exertional Chest pain. Dizziness. Leg edema. Nitroglycerin use. Orthopnea. Palpitations. Paroxysmal nocturnal dyspnea. Shortness of breath. Syncope.     Past Medical History:  Diagnosis Date   AICD (automatic cardioverter/defibrillator) present  Anxiety    Arthritis    Back pain    Breast cancer ([place]) 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   Bronchiectasis ([place])    Cerebral aneurysm, nonruptured    had a clip put in   CHF (congestive heart failure) (HCC)    Clostridium difficile infection    Depressive disorder, not elsewhere classified    Diverticulosis of colon (without mention of hemorrhage)    Esophageal reflux    Fatty liver    Fibromyalgia    Gastritis    GERD (gastroesophageal reflux disease)    GI bleed 2004   Glaucoma    Hiatal hernia    History of COVID-19  05/04/2021   Hyperlipidemia    Hypertension    Hypothyroidism    Internal hemorrhoids    Joint pain    Multifocal pneumonia 06/03/2021   Obstructive sleep apnea (adult) (pediatric)    Osteoarthritis    Ostium secundum type atrial septal defect    Other chronic nonalcoholic liver disease    Other pulmonary embolism and infarction    Palpitations    Paroxysmal ventricular tachycardia    Personal history of radiation therapy    PONV (postoperative nausea and vomiting)    Presence of permanent cardiac pacemaker    PUD (peptic ulcer disease)    Radiation 02/03/15-03/10/15   Right Breast   Sarcoid    per pt , not sure   Schatzki's ring    Shortness of breath    Sleep apnea    Stroke [facility]) 2013   tia/ pt feels it was around 2008 0r 2009   Takotsubo syndrome    Tubular adenoma of colon    Unspecified transient cerebral ischemia    Unspecified vitamin D deficiency     Past Surgical History:  Procedure Laterality Date   ABI  2006   normal   BREAST EXCISIONAL BIOPSY     BREAST LUMPECTOMY WITH RADIOACTIVE SEED LOCALIZATION Right 01/03/2015   Procedure: BREAST LUMPECTOMY WITH RADIOACTIVE SEED LOCALIZATION;  Surgeon: Autumn Messing III, MD;  Location: [place];  Service: General;  Laterality: Right;   CARDIAC DEFIBRILLATOR PLACEMENT  2006; 2012   BSX single chamber ICD   carotid dopplers  2006   neg   CEREBRAL ANEURYSM REPAIR  02/1999   COLONOSCOPY     HAMMER TOE SURGERY  11/15/2020   3rd digit bilateral feet    hospitalization  2004   GI bleed, PUD, diverticulosis (EGD,colonscopy)   hospitalization     PE, NSVT, s/p defib   KNEE ARTHROSCOPY     bilateral   LEFT HEART CATHETERIZATION WITH CORONARY ANGIOGRAM N/A 02/22/2012   Procedure: LEFT HEART CATHETERIZATION WITH CORONARY ANGIOGRAM;  Surgeon: Burnell Blanks, MD;  Location: [facility] CATH LAB;  Service: Cardiovascular;  Laterality: N/A;   PARTIAL HYSTERECTOMY     Fibroids   TONGUE BIOPSY  12/12/2017   due to sore tongue and white  patches/abnormal cells   TOTAL KNEE ARTHROPLASTY Left 02/13/2016   Procedure: TOTAL KNEE ARTHROPLASTY;  Surgeon: Vickey Huger, MD;  Location: [place];  Service: Orthopedics;  Laterality: Left;   TOTAL KNEE ARTHROPLASTY Right 05/22/2021   Procedure: TOTAL KNEE ARTHROPLASTY;  Surgeon: Vickey Huger, MD;  Location: [facility];  Service: Orthopedics;  Laterality: Right;  with block   UPPER GASTROINTESTINAL ENDOSCOPY       Current Outpatient Medications  Medication Sig Dispense Refill   acetaminophen (TYLENOL) 500 MG tablet Take 500 mg by mouth every 6 (six) hours  as needed for moderate pain.     albuterol (PROVENTIL) (2.5 MG/3ML) 0.083% nebulizer solution USE 1 VIAL IN NEBULIZER EVERY 6 HOURS (Patient taking differently: Take 2.5 mg by nebulization every 6 (six) hours as needed for shortness of breath.) 120 mL 11   albuterol (VENTOLIN HFA) 108 (90 Base) MCG/ACT inhaler Inhale 2 puffs into the lungs every 6 (six) hours as needed for wheezing or shortness of breath. 18 g 3   Ascorbic Acid (VITAMIN C) 1000 MG tablet Take 1,000 mg by mouth daily.     aspirin EC 81 MG tablet Take 81 mg by mouth daily. Swallow whole.     azelastine (ASTELIN) 0.1 % nasal spray Place 1 spray into both nostrils 2 (two) times daily. Use in each nostril as directed 30 mL 2   baclofen (LIORESAL) 10 MG tablet Take 1 tablet (10 mg total) by mouth at bedtime as needed for muscle spasms. 90 tablet 1   budesonide-formoterol (SYMBICORT) 80-4.5 MCG/ACT inhaler INHALE 2 PUFFS TWICE DAILY IN THE MORNING AND AT BEDTIME 3 each 6   carvedilol (COREG) 12.5 MG tablet Take 1 tablet (12.5 mg total) by mouth 2 (two) times daily. 180 tablet 3   cetirizine (ZYRTEC) 10 MG tablet Take 10 mg by mouth daily as needed for allergies.     Cholecalciferol (DIALYVITE VITAMIN D 5000) 125 MCG (5000 UT) capsule Take 5,000 Units by mouth daily.     diclofenac Sodium (VOLTAREN) 1 % GEL Apply 2-4 grams to affected joint 4 times daily as needed. (Patient taking  differently: Apply 2-4 g topically 4 (four) times daily as needed (joint pain).) 400 g 2   ezetimibe (ZETIA) 10 MG tablet Take 1 tablet (10 mg total) by mouth daily. 30 tablet 11   fexofenadine (ALLEGRA) 180 MG tablet Take 180 mg by mouth daily as needed for allergies (alternates with zyrtec sometimes).     fluticasone (FLONASE) 50 MCG/ACT nasal spray Place 2 sprays into both nostrils daily as needed for allergies or rhinitis. 48 g 3   furosemide (LASIX) 20 MG tablet Take 40 mg (2 tabs) 3 days per week. Take 20 mg the other days of the week. 90 tablet 3   HYDROcodone-acetaminophen (NORCO/VICODIN) 5-325 MG tablet Take 1 tablet by mouth every 6 (six) hours as needed for pain.     hydroxypropyl methylcellulose / hypromellose (ISOPTO TEARS / GONIOVISC) 2.5 % ophthalmic solution Place 1 drop into both eyes at bedtime.     latanoprost (XALATAN) 0.005 % ophthalmic solution Place 1 drop into both eyes at bedtime.     levothyroxine (SYNTHROID) 50 MCG tablet Take 1 tablet (50 mcg total) by mouth daily. 90 tablet 1   magnesium oxide (MAG-OX) 400 MG tablet Take 1 tablet (400 mg total) by mouth in the morning, at noon, and at bedtime. (Patient taking differently: Take 400 mg by mouth 2 (two) times daily.) 270 tablet 3   methocarbamol (ROBAXIN) 500 MG tablet Take 1-2 tablets (500-1,000 mg total) by mouth every 6 (six) hours as needed for muscle spasms. 60 tablet 0   mometasone (ELOCON) 0.1 % lotion Apply 1 application topically daily as needed (rash). 60 mL 1   montelukast (SINGULAIR) 10 MG tablet TAKE 1 TABLET AT BEDTIME 90 tablet 3   Multiple Vitamins-Minerals (MULTIVITAMIN PO) Take 1 tablet by mouth daily.     nitroGLYCERIN (NITROSTAT) 0.4 MG SL tablet DISSOLVE ONE TABLET UNDER THE TONGUE EVERY 5 MINUTES AS NEEDED FOR CHEST PAIN.  DO NOT EXCEED A TOTAL  OF 3 DOSES IN 15 MINUTES (Patient taking differently: Place 0.4 mg under the tongue every 5 (five) minutes as needed for chest pain.) 25 tablet 0   pantoprazole  (PROTONIX) 40 MG tablet Take 1 tablet (40 mg total) by mouth daily. (Patient taking differently: Take 40 mg by mouth See admin instructions. Take 40 mg daily, may take a second 40 mg dose as needed for acid reflux) 30 tablet 11   phenol (CHLORASEPTIC) 1.4 % LIQD Use as directed 1 spray in the mouth or throat 3 (three) times daily as needed for throat irritation / pain. 20 mL 0   polyethylene glycol powder (GLYCOLAX/MIRALAX) powder Take 17 grams by mouth daily 1080 g 0   potassium chloride (KLOR-CON M) 10 MEQ tablet TAKE 1/2 TABLET EVERY DAY (CALL OFFICE TO SCHEDULE YEARLY APPT) 30 tablet 11   Respiratory Therapy Supplies (FLUTTER) DEVI 1 Device by Does not apply route as needed. 1 each 0   sodium chloride HYPERTONIC 3 % nebulizer solution Take by nebulization 2 (two) times daily as needed for cough. 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 750 mL 12   sodium chloride HYPERTONIC 3 % nebulizer solution Take by nebulization in the morning and at bedtime. 750 mL 6   traMADol (ULTRAM) 50 MG tablet Take 1 tablet (50 mg total) by mouth 2 (two) times daily. (Patient taking differently: Take 50 mg by mouth 2 (two) times daily as needed for moderate pain.) 180 tablet 1   traZODone (DESYREL) 50 MG tablet Take 0.5-1 tablets (25-50 mg total) by mouth at bedtime as needed for sleep. 90 tablet 0   zolpidem (AMBIEN) 5 MG tablet Take 1 tablet (5 mg total) by mouth at bedtime as needed for sleep. 90 tablet 0   No current facility-administered medications for this visit.    Allergies:   Ace inhibitors, Amitriptyline hcl, Atorvastatin, Cymbalta [duloxetine hcl], Dilantin [phenytoin sodium extended], Paroxetine, Ramipril, Rosuvastatin, Carbamazepine, Codeine, Paxlovid [nirmatrelvir-ritonavir], Phenytoin sodium extended, Simvastatin, and Wellbutrin [bupropion]    Social History:  The patient  reports that she quit smoking about 32 years ago. Her smoking use included cigarettes. She has a 15.00 pack-year smoking history. She has never used  smokeless tobacco. She reports current alcohol use. She reports that she does not currently use drugs after having used the following drugs: Marijuana.   Family History:  The patient's family history includes Allergies in her sister; Alzheimer's disease in her mother; Breast cancer in her cousin and maternal aunt; Breast cancer (age of onset: 81) in her maternal aunt; Cancer (age of onset: 52) in her maternal grandmother; Colon cancer (age of onset: 42) in her maternal aunt; Diabetes in her mother; Heart disease in her brother, father, sister, and son; Hypertension in her mother; Lung cancer in her maternal uncle; Lung cancer (age of onset: 36) in her maternal grandfather; Obesity in her mother; Other in her brother; Pancreatic cancer (age of onset: 98) in her cousin; Prostate cancer (age of onset: 13) in her brother; Throat cancer in her brother.    ROS:  Please see the history of present illness.   Otherwise, review of systems are positive for decreased stamina with less walking.   All other systems are reviewed and negative.    PHYSICAL EXAM: VS:  BP 120/68    Pulse 67    Ht 5' 6.5" (1.689 m)    Wt 211 lb (95.7 kg)    LMP  (LMP Unknown)    SpO2 99%    BMI 33.55 kg/m  ,  BMI Body mass index is 33.55 kg/m. GEN: Well nourished, well developed, in no acute distress HEENT: normal Neck: no JVD, carotid bruits, or masses Cardiac: RRR; no murmurs, rubs, or gallops,no edema  Respiratory:  clear to auscultation bilaterally, normal work of breathing GI: soft, nontender, nondistended, + BS MS: no deformity or atrophy Skin: warm and dry, no rash Neuro:  Strength and sensation are intact Psych: euthymic mood, full affect   EKG:   The ekg ordered today demonstrates NSR, no ST segment changes   Recent Labs: 04/10/2021: TSH 1.55 05/15/2021: Magnesium 2.3 06/03/2021: B Natriuretic Peptide 24.6 06/05/2021: ALT 19; Hemoglobin 9.5; Platelets 434 08/29/2021: BUN 19; Creatinine, Ser 0.89; Potassium 3.8;  Sodium 135   Lipid Panel    Component Value Date/Time   CHOL 178 12/20/2020 1045   CHOL 171 10/01/2019 1408   TRIG 116 06/03/2021 1300   HDL 59.30 12/20/2020 1045   HDL 47 10/01/2019 1408   CHOLHDL 3 12/20/2020 1045   VLDL 18.4 12/20/2020 1045   LDLCALC 100 (H) 12/20/2020 1045   LDLCALC 97 10/01/2019 1408   LDLDIRECT 141.8 07/04/2007 0936     Other studies Reviewed: Additional studies/ records that were reviewed today with results demonstrating: Labsreviewed, CK normalized after simvastatin was stopped.   ASSESSMENT AND PLAN:  Prior NICM: EF had improved. DOE post surgery/ tachycardia with minimal activity. heart rate has improved.  She appears euvolemic.  Mild coronary calcification noted on chest CT. HTN: The current medical regimen is effective;  continue present plan and medications. Hyperlipidemia/aortic atherosclerosis: Controlled on simvastatin but had increased CK.   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 Now on ezetemibe. Check CK and lipids in 1 month. obesity: She needs to increase exercise to target below.  Whole food, plant-based diet recommended.  Avoid processed foods. Knee pain: Much improved after surgery. ICD per EP.   Current medicines are reviewed at length with the patient today.  The patient concerns regarding her medicines were addressed.  The following changes have been made:  No change  Labs/ tests ordered today include: Lipids and liver and CK to be checked in 1 month. No orders of the defined types were placed in this encounter.   Recommend 150 minutes/week of aerobic exercise Low fat, low carb, high fiber diet recommended  Disposition:   FU in 1 year   Signed, Larae Grooms, MD  09/22/2021 10:56 AM    [facility] [address], [place], [place]  [address] Phone: [phone]; Fax: [phone]

## 2021-09-22 ENCOUNTER — Encounter: Payer: Self-pay | Admitting: Interventional Cardiology

## 2021-09-22 ENCOUNTER — Other Ambulatory Visit: Payer: Self-pay

## 2021-09-22 ENCOUNTER — Ambulatory Visit (INDEPENDENT_AMBULATORY_CARE_PROVIDER_SITE_OTHER): Payer: Medicare Other | Admitting: Interventional Cardiology

## 2021-09-22 VITALS — BP 120/68 | HR 67 | Ht 66.5 in | Wt 211.0 lb

## 2021-09-22 DIAGNOSIS — E782 Mixed hyperlipidemia: Secondary | ICD-10-CM

## 2021-09-22 DIAGNOSIS — I2510 Atherosclerotic heart disease of native coronary artery without angina pectoris: Secondary | ICD-10-CM | POA: Diagnosis not present

## 2021-09-22 DIAGNOSIS — I10 Essential (primary) hypertension: Secondary | ICD-10-CM

## 2021-09-22 DIAGNOSIS — I428 Other cardiomyopathies: Secondary | ICD-10-CM | POA: Diagnosis not present

## 2021-09-22 DIAGNOSIS — I2584 Coronary atherosclerosis due to calcified coronary lesion: Secondary | ICD-10-CM

## 2021-09-22 DIAGNOSIS — E669 Obesity, unspecified: Secondary | ICD-10-CM | POA: Diagnosis not present

## 2021-09-22 DIAGNOSIS — I700 Atherosclerosis of aorta: Secondary | ICD-10-CM

## 2021-09-22 DIAGNOSIS — T466X5A Adverse effect of antihyperlipidemic and antiarteriosclerotic drugs, initial encounter: Secondary | ICD-10-CM

## 2021-09-22 DIAGNOSIS — M609 Myositis, unspecified: Secondary | ICD-10-CM | POA: Diagnosis not present

## 2021-09-22 NOTE — Patient Instructions (Signed)
Medication Instructions:  Your physician recommends that you continue on your current medications as directed. Please refer to the Current Medication list given to you today.  *If you need a refill on your cardiac medications before your next appointment, please call your pharmacy*   Lab Work: IN ONE MONTH: Fasting lipids and CK  If you have labs (blood work) drawn today and your tests are completely normal, you will receive your results only by: [place] (if you have MyChart) OR A paper copy in the mail If you have any lab test that is abnormal or we need to change your treatment, we will call you to review the results.  Follow-Up: At [facility], you and your health needs are our priority.  As part of our continuing mission to provide you with exceptional heart care, we have created designated Provider Care Teams.  These Care Teams include your primary Cardiologist (physician) and Advanced Practice Providers (APPs -  Physician Assistants and Nurse Practitioners) who all work together to provide you with the care you need, when you need it.  Your next appointment:   1 year(s)  The format for your next appointment:   In Person  Provider:   Larae Grooms, MD

## 2021-09-25 ENCOUNTER — Ambulatory Visit: Payer: Medicare Other | Admitting: Physical Medicine and Rehabilitation

## 2021-10-19 ENCOUNTER — Other Ambulatory Visit: Payer: Self-pay

## 2021-10-19 ENCOUNTER — Other Ambulatory Visit: Payer: Medicare Other

## 2021-10-19 DIAGNOSIS — T466X5A Adverse effect of antihyperlipidemic and antiarteriosclerotic drugs, initial encounter: Secondary | ICD-10-CM

## 2021-10-19 DIAGNOSIS — M609 Myositis, unspecified: Secondary | ICD-10-CM

## 2021-10-19 DIAGNOSIS — E669 Obesity, unspecified: Secondary | ICD-10-CM

## 2021-10-19 DIAGNOSIS — I10 Essential (primary) hypertension: Secondary | ICD-10-CM

## 2021-10-19 DIAGNOSIS — I428 Other cardiomyopathies: Secondary | ICD-10-CM

## 2021-10-19 LAB — HEPATIC FUNCTION PANEL
ALT: 21 IU/L (ref 0–32)
AST: 25 IU/L (ref 0–40)
Albumin: 3.9 g/dL (ref 3.7–4.7)
Alkaline Phosphatase: 122 IU/L — ABNORMAL HIGH (ref 44–121)
Bilirubin Total: 0.3 mg/dL (ref 0.0–1.2)
Bilirubin, Direct: <0.1 mg/dL (ref 0.00–0.40)
Total Protein: 6.6 g/dL (ref 6.0–8.5)

## 2021-10-19 LAB — LIPID PANEL
Chol/HDL Ratio: 3.5 ratio (ref 0.0–4.4)
Cholesterol, Total: 178 mg/dL (ref 100–199)
HDL: 51 mg/dL (ref >39)
LDL Chol Calc (NIH): 109 mg/dL — ABNORMAL HIGH (ref 0–99)
Triglycerides: 100 mg/dL (ref 0–149)
VLDL Cholesterol Cal: 18 mg/dL (ref 5–40)

## 2021-10-19 LAB — CK: Total CK: 101 U/L (ref 32–182)

## 2021-10-23 ENCOUNTER — Encounter: Payer: Self-pay | Admitting: Interventional Cardiology

## 2021-10-24 ENCOUNTER — Other Ambulatory Visit: Payer: Self-pay | Admitting: *Deleted

## 2021-10-24 ENCOUNTER — Other Ambulatory Visit: Payer: Self-pay

## 2021-10-24 DIAGNOSIS — J301 Allergic rhinitis due to pollen: Secondary | ICD-10-CM

## 2021-10-24 MED ORDER — EZETIMIBE 10 MG PO TABS: 10.0000 mg | ORAL_TABLET | Freq: Every day | ORAL | 3 refills | Status: DC

## 2021-10-24 MED ORDER — FUROSEMIDE 20 MG PO TABS: ORAL_TABLET | ORAL | 3 refills | Status: DC

## 2021-10-24 MED ORDER — AZELASTINE HCL 0.1 % NA SOLN: 1.0000 | Freq: Two times a day (BID) | NASAL | 3 refills | Status: AC

## 2021-11-13 ENCOUNTER — Encounter
Payer: Medicare Other | Attending: Physical Medicine and Rehabilitation | Admitting: Physical Medicine and Rehabilitation

## 2021-11-13 ENCOUNTER — Other Ambulatory Visit: Payer: Self-pay

## 2021-11-13 ENCOUNTER — Encounter: Payer: Self-pay | Admitting: Physical Medicine and Rehabilitation

## 2021-11-13 VITALS — BP 115/70 | HR 71 | Ht 66.5 in | Wt 221.8 lb

## 2021-11-13 DIAGNOSIS — G8929 Other chronic pain: Secondary | ICD-10-CM | POA: Diagnosis present

## 2021-11-13 DIAGNOSIS — M25511 Pain in right shoulder: Secondary | ICD-10-CM | POA: Diagnosis present

## 2021-11-13 DIAGNOSIS — G4709 Other insomnia: Secondary | ICD-10-CM | POA: Insufficient documentation

## 2021-11-13 DIAGNOSIS — G894 Chronic pain syndrome: Secondary | ICD-10-CM | POA: Diagnosis present

## 2021-11-13 DIAGNOSIS — M25512 Pain in left shoulder: Secondary | ICD-10-CM | POA: Diagnosis present

## 2021-11-13 MED ORDER — TRAMADOL HCL 50 MG PO TABS: 50.0000 mg | ORAL_TABLET | Freq: Two times a day (BID) | ORAL | 1 refills | Status: DC | PRN

## 2021-11-13 MED ORDER — TRAZODONE HCL 100 MG PO TABS: 100.0000 mg | ORAL_TABLET | Freq: Every day | ORAL | 1 refills | Status: DC

## 2021-11-13 NOTE — Progress Notes (Signed)
Patient is a 71 yr old R handed female with hx of  Bronchiesctasis, CHF, prediabetes- with A1c of 5.6- as of 02/15/21- - not on blood thinners, has a defibrillator, GERD, and fibromyalgia issues- dx'd 10 years ago.  ?Here for f/u on chronic pain and trigger oint injections. ?Last got subacromial shoulder injections B/L on 03/29/21.  ?S/P R TKR 10/22; also COVID B/L pneumonia now on O2 full time ?Here for B/L shoulder injections- last done 08/25/21- cannot be done before 3 months in future ? ? Having R low back pain- asking if can treat this.  ?Started 1 month ago.  ? ?Sleep - cannot sleep- ?Tried baclofen and tramadol.  ?Something shot BP up high.  ?Felt light headed 170/80s- 1 month ago- hadn't eaten anything- put feet up- started going down.  ? ? ? ? ? ?Plan: ?Cannot see pt BEFORE 3 month appt- due to injections. In future ? ?2.  steroid injection was performed at B/L subacromial injections using 1% plain Lidocaine and '40mg'$  /1cc of Kenalog. This was well tolerated. ? ?Cleaned with betadine x3 and allowed to dry- then alcohol then injected using 27 gauge 1.5 inch needle- no bleeding or complications.  ? ? ?F/U in 3 months for steroid injections of subacromial B/L injections.   ?Lidocaine will kick in 15 minutes- and wear off tonight- the steroid will kick in tomorrow within 24 hours and take up to 72 hours to fully kick in. ? ?3. Sounds like albuterol coupled with increased pain could have caused BP and heart rate elevation- but other meds are not likely the cause.  ? ? ?4. Will try Trazodone 100-150 mg nightly- 3 month supply- 1 refill- I don't prescribe Ambien but will try increase in Trazodone. Can also mix with melatonin 5 mg nightly if need be.  ? ?5. Tramadol- 50 mg BID as needed- #180- 3 months supply- 1 refill ? ?6. F/U in 3 months- after 90 days- for B/L subacromial steroid injections  ?

## 2021-11-13 NOTE — Patient Instructions (Signed)
?  Plan: ?Cannot see pt BEFORE 3 month appt- due to injections. In future ? ?2.  steroid injection was performed at B/L subacromial injections using 1% plain Lidocaine and '40mg'$  /1cc of Kenalog. This was well tolerated. ? ?Cleaned with betadine x3 and allowed to dry- then alcohol then injected using 27 gauge 1.5 inch needle- no bleeding or complications.  ? ? ?F/U in 3 months for steroid injections of subacromial B/L injections.   ?Lidocaine will kick in 15 minutes- and wear off tonight- the steroid will kick in tomorrow within 24 hours and take up to 72 hours to fully kick in. ? ?3. Sounds like albuterol coupled with increased pain could have caused BP and heart rate elevation- but other meds are not likely the cause.  ? ? ?4. Will try Trazodone 100-150 mg nightly- 3 month supply- 1 refill- I don't prescribe Ambien but will try increase in Trazodone. Can also mix with melatonin 5 mg nightly if need be.  ? ?5. Tramadol- 50 mg BID as needed- #180- 3 months supply- 1 refill ? ?6. F/U in 3 months- after 90 days- for B/L subacromial steroid injections  ?

## 2021-11-19 ENCOUNTER — Other Ambulatory Visit: Payer: Self-pay | Admitting: Internal Medicine

## 2021-11-20 ENCOUNTER — Ambulatory Visit (INDEPENDENT_AMBULATORY_CARE_PROVIDER_SITE_OTHER): Payer: Medicare Other | Admitting: Internal Medicine

## 2021-11-20 ENCOUNTER — Encounter: Payer: Self-pay | Admitting: Family

## 2021-11-20 ENCOUNTER — Encounter: Payer: Self-pay | Admitting: Internal Medicine

## 2021-11-20 VITALS — BP 130/72 | HR 68 | Ht 66.5 in | Wt 219.0 lb

## 2021-11-20 DIAGNOSIS — I428 Other cardiomyopathies: Secondary | ICD-10-CM

## 2021-11-20 DIAGNOSIS — I2510 Atherosclerotic heart disease of native coronary artery without angina pectoris: Secondary | ICD-10-CM | POA: Diagnosis not present

## 2021-11-20 DIAGNOSIS — I10 Essential (primary) hypertension: Secondary | ICD-10-CM

## 2021-11-20 DIAGNOSIS — I2584 Coronary atherosclerosis due to calcified coronary lesion: Secondary | ICD-10-CM | POA: Diagnosis not present

## 2021-11-20 DIAGNOSIS — Z95810 Presence of automatic (implantable) cardiac defibrillator: Secondary | ICD-10-CM

## 2021-11-20 DIAGNOSIS — I5042 Chronic combined systolic (congestive) and diastolic (congestive) heart failure: Secondary | ICD-10-CM

## 2021-11-20 DIAGNOSIS — I472 Ventricular tachycardia, unspecified: Secondary | ICD-10-CM | POA: Diagnosis not present

## 2021-11-20 DIAGNOSIS — R000 Tachycardia, unspecified: Secondary | ICD-10-CM

## 2021-11-20 DIAGNOSIS — I493 Ventricular premature depolarization: Secondary | ICD-10-CM | POA: Diagnosis not present

## 2021-11-20 MED ORDER — MAGNESIUM OXIDE 400 MG PO TABS: 400.0000 mg | ORAL_TABLET | Freq: Two times a day (BID) | ORAL | 3 refills | Status: DC

## 2021-11-20 MED ORDER — CARVEDILOL 12.5 MG PO TABS: ORAL_TABLET | ORAL | 0 refills | Status: DC

## 2021-11-20 MED ORDER — EMPAGLIFLOZIN 10 MG PO TABS: 10.0000 mg | ORAL_TABLET | Freq: Every day | ORAL | 3 refills | Status: DC

## 2021-11-20 MED ORDER — AMLODIPINE BESYLATE 2.5 MG PO TABS: 2.5000 mg | ORAL_TABLET | Freq: Every day | ORAL | 0 refills | Status: DC

## 2021-11-20 MED ORDER — AMLODIPINE BESYLATE 2.5 MG PO TABS: 2.5000 mg | ORAL_TABLET | Freq: Every day | ORAL | 3 refills | Status: DC

## 2021-11-20 NOTE — Progress Notes (Signed)
? ? ? ? ?Patient Care Team: ?Camillia Herter, NP as PCP - General (Nurse Practitioner) ?Deboraha Sprang, MD as PCP - Electrophysiology (Cardiology) ?Jettie Booze, MD as PCP - Cardiology (Cardiology) ?Jovita Kussmaul, MD as Consulting Physician (General Surgery) ?Truitt Merle, MD as Consulting Physician (Hematology) ?Thea Silversmith, MD as Consulting Physician (Radiation Oncology) ?Rockwell Germany, RN as Registered Nurse ?Mauro Kaufmann, RN as Registered Nurse ?Deboraha Sprang, MD as Consulting Physician (Cardiology) ?Holley Bouche, NP (Inactive) as Nurse Practitioner (Nurse Practitioner) ?Sylvan Cheese, NP as Nurse Practitioner (Nurse Practitioner) ?Chesley Mires, MD as Consulting Physician (Pulmonary Disease) ?Jovita Kussmaul, MD as Consulting Physician (General Surgery) ?Bo Merino, MD as Consulting Physician (Rheumatology) ? ? ?HPI ? ?[name] is a 71 y.o. 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 ?DATE TEST EF   ?2013 Echo   35-40 %   ?1/18 Echo   55-65 %   ?9/18 Echo  55-65% E/E'   ?12/22 Echo   50-55%   ? ? ?Date Cr K Hgb  ?7/18 0.82 4.1   13.2  ?4/19  0.78 3.9 12.9  ?4/20 0.76 4.1   ?1/23 0.89 3.8 9.5(10/22)  ?  ?She has been seen by pulmonary.  She carries a diagnosis of bronchiectasis. ? ?Hospitalized 10/22 with acute respiratory failure attributed to atypical pneumonia in the context of her bronchiectasis and pulmonary fibrosis and antecedent COVID ? ?The patient denies chest pain, nocturnal dyspnea, orthopnea or peripheral edema.  There have been no syncope.  Complains of dyspnea albeit without edema or swelling of her abdomen.  Continues with some palpitations.  Not so disruptive.  Typically brief.  Remains better on the carvedilol higher dose.  ?  ?  ? ?Past Medical History:  ?Diagnosis Date  ?  AICD (automatic cardioverter/defibrillator) present   ? Anxiety   ? Arthritis   ? Back pain   ? Breast cancer ([place]) 2016  ? 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 Bronchiectasis ([place])   ? Cerebral aneurysm, nonruptured   ? had a clip put in  ? CHF (congestive heart failure) ([place])   ? Clostridium difficile infection   ? Depressive disorder, not elsewhere classified   ? Diverticulosis of colon (without mention of hemorrhage)   ? Esophageal reflux   ? Fatty liver   ? Fibromyalgia   ? Gastritis   ? GERD (gastroesophageal reflux disease)   ? GI bleed 2004  ? Glaucoma   ? Hiatal hernia   ? History of COVID-19 05/04/2021  ? Hyperlipidemia   ? Hypertension   ? Hypothyroidism   ? Internal hemorrhoids   ? Joint pain   ? Multifocal pneumonia 06/03/2021  ? Obstructive sleep apnea (adult) (pediatric)   ? Osteoarthritis   ? Ostium secundum type atrial septal defect   ? Other chronic nonalcoholic liver disease   ? Other pulmonary embolism and infarction   ? Palpitations   ? Paroxysmal ventricular tachycardia ([place])   ? Personal history of radiation therapy   ? PONV (postoperative nausea and vomiting)   ? Presence of permanent cardiac pacemaker   ? PUD (peptic ulcer disease)   ? Radiation 02/03/15-03/10/15  ? Right Breast  ? Sarcoid   ? per pt , not sure  ? Schatzki's ring   ? Shortness of breath   ?  Sleep apnea   ? Stroke [facility]) 2013  ? tia/ pt feels it was around 2008 0r 2009  ? Takotsubo syndrome   ? Tubular adenoma of colon   ? Unspecified transient cerebral ischemia   ? Unspecified vitamin D deficiency   ? ? ?Past Surgical History:  ?Procedure Laterality Date  ? ABI  2006  ? normal  ? BREAST EXCISIONAL BIOPSY    ? BREAST LUMPECTOMY WITH RADIOACTIVE SEED LOCALIZATION Right 01/03/2015  ? Procedure: BREAST LUMPECTOMY WITH RADIOACTIVE SEED LOCALIZATION;  Surgeon: Autumn Messing III, MD;  Location: [place];  Service: General;  Laterality: Right;  ? CARDIAC DEFIBRILLATOR PLACEMENT  2006; 2012  ? BSX single chamber ICD  ? carotid dopplers   2006  ? neg  ? CEREBRAL ANEURYSM REPAIR  02/1999  ? COLONOSCOPY    ? HAMMER TOE SURGERY  11/15/2020  ? 3rd digit bilateral feet   ? hospitalization  2004  ? GI bleed, PUD, diverticulosis (EGD,colonscopy)  ? hospitalization    ? PE, NSVT, s/p defib  ? KNEE ARTHROSCOPY    ? bilateral  ? LEFT HEART CATHETERIZATION WITH CORONARY ANGIOGRAM N/A 02/22/2012  ? Procedure: LEFT HEART CATHETERIZATION WITH CORONARY ANGIOGRAM;  Surgeon: Burnell Blanks, MD;  Location[facility] CATH LAB;  Service: Cardiovascular;  Laterality: N/A;  ? PARTIAL HYSTERECTOMY    ? Fibroids  ? TONGUE BIOPSY  12/12/2017  ? due to sore tongue and white patches/abnormal cells  ? TOTAL KNEE ARTHROPLASTY Left 02/13/2016  ? Procedure: TOTAL KNEE ARTHROPLASTY;  Surgeon: Vickey Huger, MD;  Location: [place];  Service: Orthopedics;  Laterality: Left;  ? TOTAL KNEE ARTHROPLASTY Right 05/22/2021  ? Procedure: TOTAL KNEE ARTHROPLASTY;  Surgeon: Vickey Huger, MD;  Location: [facility];  Service: Orthopedics;  Laterality: Right;  with block  ? UPPER GASTROINTESTINAL ENDOSCOPY    ? ? ?Current Outpatient Medications  ?Medication Sig Dispense Refill  ? acetaminophen (TYLENOL) 500 MG tablet Take 500 mg by mouth every 6 (six) hours as needed for moderate pain.    ? albuterol (PROVENTIL) (2.5 MG/3ML) 0.083% nebulizer solution USE 1 VIAL IN NEBULIZER EVERY 6 HOURS (Patient taking differently: Take 2.5 mg by nebulization every 6 (six) hours as needed for shortness of breath.) 120 mL 11  ? albuterol (VENTOLIN HFA) 108 (90 Base) MCG/ACT inhaler Inhale 2 puffs into the lungs every 6 (six) hours as needed for wheezing or shortness of breath. 18 g 3  ? Ascorbic Acid (VITAMIN C) 1000 MG tablet Take 1,000 mg by mouth daily.    ? aspirin EC 81 MG tablet Take 81 mg by mouth daily. Swallow whole.    ? azelastine (ASTELIN) 0.1 % nasal spray Place 1 spray into both nostrils 2 (two) times daily. Use in each nostril as directed 90 mL 3  ? baclofen (LIORESAL) 10 MG tablet Take 1 tablet (10 mg total)  by mouth at bedtime as needed for muscle spasms. 90 tablet 1  ? budesonide-formoterol (SYMBICORT) 80-4.5 MCG/ACT inhaler INHALE 2 PUFFS TWICE DAILY IN THE MORNING AND AT BEDTIME 3 each 6  ? carvedilol (COREG) 12.5 MG tablet Take 1 tablet (12.5 mg total) by mouth 2 (two) times daily. 180 tablet 3  ? cetirizine (ZYRTEC) 10 MG tablet Take 10 mg by mouth daily as needed for allergies.    ? Cholecalciferol (DIALYVITE VITAMIN D 5000) 125 MCG (5000 UT) capsule Take 5,000 Units by mouth daily.    ? diclofenac Sodium (VOLTAREN) 1 % GEL Apply 2-4 grams to affected joint  4 times daily as needed. (Patient taking differently: Apply 2-4 g topically 4 (four) times daily as needed (joint pain).) 400 g 2  ? ezetimibe (ZETIA) 10 MG tablet Take 1 tablet (10 mg total) by mouth daily. 90 tablet 3  ? fexofenadine (ALLEGRA) 180 MG tablet Take 180 mg by mouth daily as needed for allergies (alternates with zyrtec sometimes).    ? fluticasone (FLONASE) 50 MCG/ACT nasal spray Place 2 sprays into both nostrils daily as needed for allergies or rhinitis. 48 g 3  ? furosemide (LASIX) 20 MG tablet Take 40 mg (2 tabs) 3 days per week. Take 20 mg the other days of the week. 90 tablet 3  ? hydroxypropyl methylcellulose / hypromellose (ISOPTO TEARS / GONIOVISC) 2.5 % ophthalmic solution Place 1 drop into both eyes at bedtime.    ? latanoprost (XALATAN) 0.005 % ophthalmic solution Place 1 drop into both eyes at bedtime.    ? levothyroxine (SYNTHROID) 50 MCG tablet Take 1 tablet (50 mcg total) by mouth daily. 90 tablet 1  ? magnesium oxide (MAG-OX) 400 MG tablet Take 1 tablet (400 mg total) by mouth in the morning, at noon, and at bedtime. (Patient taking differently: Take 400 mg by mouth 2 (two) times daily.) 270 tablet 3  ? methocarbamol (ROBAXIN) 500 MG tablet Take 1-2 tablets (500-1,000 mg total) by mouth every 6 (six) hours as needed for muscle spasms. 60 tablet 0  ? mometasone (ELOCON) 0.1 % lotion Apply 1 application topically daily as needed  (rash). 60 mL 1  ? montelukast (SINGULAIR) 10 MG tablet TAKE 1 TABLET AT BEDTIME 90 tablet 3  ? Multiple Vitamins-Minerals (MULTIVITAMIN PO) Take 1 tablet by mouth daily.    ? nitroGLYCERIN (NITROSTAT) 0.4

## 2021-11-20 NOTE — Progress Notes (Signed)
[facility] ?New Patient Evaluation and Consultation ? ?Referring Provider: Camillia Herter, NP ?PCP: Camillia Herter, NP ?Date of Service: 11/21/2021 ? ?SUBJECTIVE ?Chief Complaint: New Patient (Initial Visit) [name] is a 71 y.o. female here for a consult on incontinence and blood in the urine.) ? ?History of Present Illness: [name] is a 71 y.o. Black or African-American female seen in consultation at the request of NP Amy Minette Brine for evaluation of urinary incontinence.   ? ?Review of records from [facility] NP significant for: ?Has ongoing discomfort with urination and incontinence. Urine culture negative.  ? ?Urinary Symptoms: ?Leaks urine with with movement to the bathroom ?Leaks every time she goes to the bathroom.  ?Pad use: 2 pads per day.   ?She is bothered by her UI symptoms. ?Sometimes has sensation of burning with urination and hesitancy.  ?Has tried kegel exercises.  ? ?Day time voids 5.  Nocturia: 3 times per night to void. ?Voiding dysfunction: she empties her bladder well.  ?does not use a catheter to empty bladder.  ?When urinating, she feels a weak stream and to push on her belly or vagina to empty bladder ?Drinks: 2 cups decaf coffee in AM, 16oz soda, 10-12oz water per day ? ?UTIs:  0  UTI's in the last year.   ?Reports history of blood in urine ? ?Pelvic Organ Prolapse Symptoms:                  ?She Denies a feeling of a bulge the vaginal area.  ? ?Bowel Symptom: ?Bowel movements: 1 time(s) per day ?Stool consistency: hard ?Straining: yes.  ?Splinting: yes.  ?Incomplete evacuation: yes.  ?She Denies accidental bowel leakage / fecal incontinence ?Bowel regimen: miralax ? ?Sexual Function ?Sexually active: no.  ? ?Pelvic Pain ?Denies pelvic pain ? ? ? ?Past Medical History:  ?Past Medical History:  ?Diagnosis Date  ? AICD (automatic cardioverter/defibrillator) present   ? Anxiety   ? Arthritis   ? Back pain   ? Breast cancer ([place]) 2016  ? 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  weeks of radiation  ? Bronchiectasis ([place])   ? Cerebral aneurysm, nonruptured   ? had a clip put in  ? CHF (congestive heart failure) ([place])   ? Clostridium difficile infection   ? Depressive disorder, not elsewhere classified   ? Diverticulosis of colon (without mention of hemorrhage)   ? Esophageal reflux   ? Fatty liver   ? Fibromyalgia   ? Gastritis   ? GERD (gastroesophageal reflux disease)   ? GI bleed 2004  ? Glaucoma   ? Hiatal hernia   ? History of COVID-19 05/04/2021  ? Hyperlipidemia   ? Hypertension   ? Hypothyroidism   ? Internal hemorrhoids   ? Joint pain   ? Multifocal pneumonia 06/03/2021  ? Obstructive sleep apnea (adult) (pediatric)   ? Osteoarthritis   ? Ostium secundum type atrial septal defect   ? Other chronic nonalcoholic liver disease   ? Other pulmonary embolism and infarction   ? Palpitations   ? Paroxysmal ventricular tachycardia ([place])   ? Personal history of radiation therapy   ? PONV (postoperative nausea and vomiting)   ? Presence of permanent cardiac pacemaker   ? PUD (peptic ulcer disease)   ? Radiation 02/03/15-03/10/15  ? Right Breast  ? Sarcoid   ? per pt , not sure  ? Schatzki's ring   ? Shortness of breath   ? Sleep apnea   ? Stroke (  [place]) 2013  ? tia/ pt feels it was around 2008 0r 2009  ? Takotsubo syndrome   ? Tubular adenoma of colon   ? Unspecified transient cerebral ischemia   ? Unspecified vitamin D deficiency   ? ? ? ?Past Surgical History:   ?Past Surgical History:  ?Procedure Laterality Date  ? ABI  2006  ? normal  ? BREAST EXCISIONAL BIOPSY    ? BREAST LUMPECTOMY WITH RADIOACTIVE SEED LOCALIZATION Right 01/03/2015  ? Procedure: BREAST LUMPECTOMY WITH RADIOACTIVE SEED LOCALIZATION;  Surgeon: Autumn Messing III, MD;  Location: [place];  Service: General;  Laterality: Right;  ? CARDIAC DEFIBRILLATOR PLACEMENT  2006; 2012  ? BSX single chamber ICD  ? carotid dopplers  2006  ? neg  ? CEREBRAL ANEURYSM REPAIR  02/1999  ? COLONOSCOPY    ? HAMMER TOE SURGERY  11/15/2020  ? 3rd digit  bilateral feet   ? hospitalization  2004  ? GI bleed, PUD, diverticulosis (EGD,colonscopy)  ? hospitalization    ? PE, NSVT, s/p defib  ? KNEE ARTHROSCOPY    ? bilateral  ? LEFT HEART CATHETERIZATION WITH CORONARY ANGIOGRAM N/A 02/22/2012  ? Procedure: LEFT HEART CATHETERIZATION WITH CORONARY ANGIOGRAM;  Surgeon: Burnell Blanks, MD;  Location: [facility] CATH LAB;  Service: Cardiovascular;  Laterality: N/A;  ? PARTIAL HYSTERECTOMY    ? Fibroids  ? TONGUE BIOPSY  12/12/2017  ? due to sore tongue and white patches/abnormal cells  ? TOTAL KNEE ARTHROPLASTY Left 02/13/2016  ? Procedure: TOTAL KNEE ARTHROPLASTY;  Surgeon: Vickey Huger, MD;  Location: [place];  Service: Orthopedics;  Laterality: Left;  ? TOTAL KNEE ARTHROPLASTY Right 05/22/2021  ? Procedure: TOTAL KNEE ARTHROPLASTY;  Surgeon: Vickey Huger, MD;  [facility];  Service: Orthopedics;  Laterality: Right;  with block  ? UPPER GASTROINTESTINAL ENDOSCOPY    ? ? ? ?Past OB/GYN History: ?OB History  ?Gravida Para Term Preterm AB Living  ?'4 2 2   2 2  '$ ?SAB IAB Ectopic Multiple Live Births  ?2       2  ?  ?# Outcome Date GA Lbr Len/2nd Weight Sex Delivery Anes PTL Lv  ?4 SAB           ?3 SAB           ?2 Term           ?1 Term           ? ? ?Vaginal deliveries: 2 ?S/p hysterectomy ? ? ?Medications: She has a current medication list which includes the following prescription(s): acetaminophen, albuterol, albuterol, amlodipine, amlodipine, vitamin c, aspirin ec, azelastine, baclofen, budesonide-formoterol, carvedilol, cetirizine, dialyvite vitamin d 5000, diclofenac sodium, empagliflozin, ezetimibe, fexofenadine, fluticasone, furosemide, hydroxypropyl methylcellulose / hypromellose, latanoprost, levothyroxine, magnesium oxide, methocarbamol, mometasone, montelukast, multiple vitamin, nitroglycerin, pantoprazole, chloraseptic, polyethylene glycol powder, potassium chloride, flutter, sodium chloride hypertonic, sodium chloride hypertonic, tramadol, and trazodone.   ? ?Allergies: Patient is allergic to ace inhibitors, amitriptyline hcl, atorvastatin, cymbalta [duloxetine hcl], dilantin [phenytoin sodium extended], paroxetine, ramipril, rosuvastatin, carbamazepine, codeine, paxlovid [nirmatrelvir-ritonavir], phenytoin sodium extended, simvastatin, and wellbutrin [bupropion].  ? ?Social History:  ?Social History  ? ?Tobacco Use  ? Smoking status: Former  ?  Packs/day: 1.00  ?  Years: 15.00  ?  Pack years: 15.00  ?  Types: Cigarettes  ?  Quit date: 08/20/1989  ?  Years since quitting: 32.2  ? Smokeless tobacco: Never  ? Tobacco comments:  ?  Started at 22; less than 1 PPD  ?Vaping  Use  ? Vaping Use: Never used  ?Substance Use Topics  ? Alcohol use: Yes  ?  Comment: red wine nightly  ? Drug use: Not Currently  ?  Types: Marijuana  ?  Comment: CBD gummy occ  ? ? ?Relationship status: divorced ?She is not employed. ?Regular exercise: No ?History of abuse: No ? ?Family History:   ?Family History  ?Problem Relation Age of Onset  ? Diabetes Mother   ? Alzheimer's disease Mother   ? Hypertension Mother   ? Obesity Mother   ? Other Brother   ?     Thyroid problem 10/2016  ? Allergies Sister   ? Heart disease Sister   ? Heart disease Brother   ? Prostate cancer Brother 4  ?     same brother as throat cancer  ? Throat cancer Brother   ?     dx in his 79s; also a smoker  ? Heart disease Father   ? Cancer Maternal Grandmother 64  ?     colon cancer or abdominal cancer  ? Lung cancer Maternal Grandfather 75  ? Breast cancer Maternal Aunt 106  ? Colon cancer Maternal Aunt 18  ?     same sister as breast at 24  ? Lung cancer Maternal Uncle   ? Breast cancer Maternal Aunt   ?     dx in her 43s  ? Pancreatic cancer Cousin 89  ?     maternal first cousin  ? Breast cancer Cousin   ?     paternal first cousin twice removed died in her 26s  ? Heart disease Son   ?     Cardiac Arrest 07/2016  ? Stroke Neg Hx   ? ? ? ?Review of Systems: Review of Systems  ?Constitutional:  Negative for fever,  malaise/fatigue and weight loss.  ?Respiratory:  Positive for shortness of breath and wheezing. Negative for cough.   ?Cardiovascular:  Negative for chest pain, palpitations and leg swelling.  ?Gastrointestinal:  Positive for abdom

## 2021-11-20 NOTE — Patient Instructions (Addendum)
Medication Instructions:  ?Your physician has recommended you make the following change in your medication:  ? ? Wean off your carvedilol- ?Take 1/2 tablet (6.25 mg) by mouth twice a day for 5 days THEN ?Take 1/2 tablet (6.25 mg)by mouth ONCE a day for 5 days and then STOP ? ?2.    START taking amlodipine 2.5 mg-  Take one tablet by mouth daily ? ?3.    START taking Jardiance 10 mg-  Take one tablet by mouth daily ? ?4.    REDUCE your magnesium 400 mg-  Take one tablet by mouth TWICE a day. ? ?Labwork: ?You will get lab work today:  CBC, CK, aldolase, iron panel (iron, TIBC, ferritin) ? ?Testing/Procedures: ?None ordered. ? ?Follow-Up: ?Your physician wants you to follow-up in: one year with Dr. Caryl Comes or one of the following Advanced Practice Providers on your designated Care Team:   ?Tommye Standard, PA-C ?Legrand Como "Jonni Sanger" [place], PA-C ? ?Remote monitoring is used to monitor your ICD from home. This monitoring reduces the number of office visits required to check your device to one time per year. It allows [place] to keep an eye on the functioning of your device to ensure it is working properly. You are scheduled for a device check from home on 11/29/2021. You may send your transmission at any time that day. If you have a wireless device, the transmission will be sent automatically. After your physician reviews your transmission, you will receive a postcard with your next transmission date. ? ?Any Other Special Instructions Will Be Listed Below (If Applicable). ? ?If you need a refill on your cardiac medications before your next appointment, please call your pharmacy.  ? ? ? ? ?

## 2021-11-21 ENCOUNTER — Ambulatory Visit (INDEPENDENT_AMBULATORY_CARE_PROVIDER_SITE_OTHER): Payer: Medicare Other | Admitting: Obstetrics and Gynecology

## 2021-11-21 ENCOUNTER — Encounter: Payer: Self-pay | Admitting: Obstetrics and Gynecology

## 2021-11-21 ENCOUNTER — Encounter: Payer: Self-pay | Admitting: Family

## 2021-11-21 ENCOUNTER — Telehealth: Payer: Self-pay

## 2021-11-21 VITALS — BP 136/76 | HR 64 | Ht 66.5 in | Wt 219.0 lb

## 2021-11-21 DIAGNOSIS — R350 Frequency of micturition: Secondary | ICD-10-CM | POA: Diagnosis not present

## 2021-11-21 DIAGNOSIS — N3281 Overactive bladder: Secondary | ICD-10-CM

## 2021-11-21 LAB — CBC WITH DIFFERENTIAL/PLATELET
Basophils Absolute: 0 10*3/uL (ref 0.0–0.2)
Basos: 0 %
EOS (ABSOLUTE): 0.2 10*3/uL (ref 0.0–0.4)
Eos: 2 %
Hematocrit: 40.3 % (ref 34.0–46.6)
Hemoglobin: 13.2 g/dL (ref 11.1–15.9)
Immature Grans (Abs): 0.1 10*3/uL (ref 0.0–0.1)
Immature Granulocytes: 1 %
Lymphocytes Absolute: 1.1 10*3/uL (ref 0.7–3.1)
Lymphs: 15 %
MCH: 29.2 pg (ref 26.6–33.0)
MCHC: 32.8 g/dL (ref 31.5–35.7)
MCV: 89 fL (ref 79–97)
Monocytes Absolute: 0.7 10*3/uL (ref 0.1–0.9)
Monocytes: 9 %
Neutrophils Absolute: 5.4 10*3/uL (ref 1.4–7.0)
Neutrophils: 73 %
Platelets: 225 10*3/uL (ref 150–450)
RBC: 4.52 x10E6/uL (ref 3.77–5.28)
RDW: 12.7 % (ref 11.7–15.4)
WBC: 7.4 10*3/uL (ref 3.4–10.8)

## 2021-11-21 LAB — POCT URINALYSIS DIPSTICK
Appearance: NORMAL
Bilirubin, UA: NEGATIVE
Glucose, UA: NEGATIVE
Ketones, UA: NEGATIVE
Leukocytes, UA: NEGATIVE
Nitrite, UA: NEGATIVE
Protein, UA: NEGATIVE
Spec Grav, UA: 1.025 (ref 1.010–1.025)
Urobilinogen, UA: 0.2 E.U./dL
pH, UA: 6.5 (ref 5.0–8.0)

## 2021-11-21 LAB — ALDOLASE: Aldolase: 6.9 U/L (ref 3.3–10.3)

## 2021-11-21 LAB — IRON,TIBC AND FERRITIN PANEL
Ferritin: 87 ng/mL (ref 15–150)
Iron Saturation: 29 % (ref 15–55)
Iron: 106 ug/dL (ref 27–139)
Total Iron Binding Capacity: 364 ug/dL (ref 250–450)
UIBC: 258 ug/dL (ref 118–369)

## 2021-11-21 LAB — CK: Total CK: 126 U/L (ref 32–182)

## 2021-11-21 MED ORDER — DILTIAZEM HCL ER COATED BEADS 120 MG PO CP24: 120.0000 mg | ORAL_CAPSULE | Freq: Every day | ORAL | 1 refills | Status: DC

## 2021-11-21 NOTE — Telephone Encounter (Signed)
Per Dr Caryl Comes, called pt and told her not to pick up the amlodipine.  I would like instead to put her on Dilt 120 daily.  This to start when she finishes the wean off of her carvedilol.  Thanks SK ? ?_____________________________________________________________________________________________ ? ?Diltiazem (Cardizem CD) '120mg'$  - 1 tablet by mouth daily #90 with 1-RF sent to pharmacy on file.  ?

## 2021-11-21 NOTE — Patient Instructions (Signed)
Today we talked about ways to manage bladder urgency such as altering your diet to avoid irritative beverages and foods (bladder diet) as well as attempting to decrease stress and other exacerbating factors.   The Most Bothersome Foods* The Least Bothersome Foods*  Coffee - Regular & Decaf Tea - caffeinated Carbonated beverages - cola, non-colas, diet & caffeine-free Alcohols - Beer, Red Wine, White Wine, Champagne Fruits - Grapefruit, Lemon, Orange, Pineapple Fruit Juices - Cranberry, Grapefruit, Orange, Pineapple Vegetables - Tomato & Tomato Products Flavor Enhancers - Hot peppers, Spicy foods, Chili, Horseradish, Vinegar, Monosodium glutamate (MSG) Artificial Sweeteners - NutraSweet, Sweet 'N Low, Equal (sweetener), Saccharin Ethnic foods - Mexican, Thai, Indian food Water Milk - low-fat & whole Fruits - Bananas, Blueberries, Honeydew melon, Pears, Raisins, Watermelon Vegetables - Broccoli, Brussels Sprouts, Cabbage, Carrots, Cauliflower, Celery, Cucumber, Mushrooms, Peas, Radishes, Squash, Zucchini, White potatoes, Sweet potatoes & yams Poultry - Chicken, Eggs, Turkey, Meat - Beef, Pork, Lamb Seafood - Shrimp, Tuna fish, Salmon Grains - Oat, Rice Snacks - Pretzels, Popcorn  *Friedlander J. et al. Diet and its role in interstitial cystitis/bladder pain syndrome (IC/BPS) and comorbid conditions. [employer]. [employer]. 2012 Jan 11.    

## 2021-11-29 ENCOUNTER — Ambulatory Visit (INDEPENDENT_AMBULATORY_CARE_PROVIDER_SITE_OTHER): Payer: Medicare Other

## 2021-11-29 DIAGNOSIS — I472 Ventricular tachycardia, unspecified: Secondary | ICD-10-CM | POA: Diagnosis not present

## 2021-11-29 LAB — CUP PACEART REMOTE DEVICE CHECK
Battery Remaining Longevity: 24 mo
Battery Remaining Percentage: 24 %
Brady Statistic RV Percent Paced: 0 %
Date Time Interrogation Session: 20230412030100
HighPow Impedance: 77 Ohm
Implantable Lead Implant Date: 20050603
Implantable Lead Location: 753860
Implantable Lead Model: 185
Implantable Lead Serial Number: 116340
Implantable Pulse Generator Implant Date: 20110505
Lead Channel Impedance Value: 641 Ohm
Lead Channel Pacing Threshold Amplitude: 0.6 V
Lead Channel Pacing Threshold Pulse Width: 0.4 ms
Lead Channel Setting Pacing Amplitude: 2.4 V
Lead Channel Setting Pacing Pulse Width: 0.4 ms
Lead Channel Setting Sensing Sensitivity: 0.4 mV
Pulse Gen Serial Number: 266301

## 2021-12-05 ENCOUNTER — Encounter: Payer: Self-pay | Admitting: Pulmonary Disease

## 2021-12-05 ENCOUNTER — Ambulatory Visit (INDEPENDENT_AMBULATORY_CARE_PROVIDER_SITE_OTHER): Payer: Medicare Other | Admitting: Pulmonary Disease

## 2021-12-05 VITALS — BP 130/74 | HR 70 | Temp 98.0°F | Ht 66.5 in | Wt 218.6 lb

## 2021-12-05 DIAGNOSIS — J479 Bronchiectasis, uncomplicated: Secondary | ICD-10-CM | POA: Diagnosis not present

## 2021-12-05 DIAGNOSIS — I2510 Atherosclerotic heart disease of native coronary artery without angina pectoris: Secondary | ICD-10-CM

## 2021-12-05 DIAGNOSIS — G4733 Obstructive sleep apnea (adult) (pediatric): Secondary | ICD-10-CM | POA: Diagnosis not present

## 2021-12-05 DIAGNOSIS — J301 Allergic rhinitis due to pollen: Secondary | ICD-10-CM | POA: Diagnosis not present

## 2021-12-05 DIAGNOSIS — I2584 Coronary atherosclerosis due to calcified coronary lesion: Secondary | ICD-10-CM | POA: Diagnosis not present

## 2021-12-05 DIAGNOSIS — R0609 Other forms of dyspnea: Secondary | ICD-10-CM

## 2021-12-05 NOTE — Patient Instructions (Signed)
Follow up in 4 months 

## 2021-12-05 NOTE — Progress Notes (Signed)
? ?[facility] ? ?Chief Complaint  ?Patient presents with  ? Follow-up  ?  SOB with ambulation   ? ? ? ?Constitutional:  ?BP 130/74 (BP Location: Right Arm, Patient Position: Sitting, Cuff Size: Large)   Pulse 70   Temp 98 ?F (36.7 ?C) (Oral)   Ht 5' 6.5" (1.689 m)   Wt 218 lb 9.6 oz (99.2 kg)   LMP  (LMP Unknown)   SpO2 98%   BMI 34.75 kg/m?  ? ?Past Medical History:  ?Vit D deficiency, Takotsubo CM, Schatzki's ring, PUD, VT s/p AICD, PE, NASH, ASD, Hypothyroidism, HTN, HLD, HH, GERD, Fibromyalgia, Diverticulosis, Depression, C diff, Cerebral aneurysm, Breast cancer 2016, OA, COVID 19 pneumonia September 2022 ? ?Past Surgical History:  ?Her  has a past surgical history that includes Partial hysterectomy; Cerebral aneurysm repair (02/1999); Knee arthroscopy; Cardiac defibrillator placement (2006; 2012); carotid dopplers (2006); hospitalization (2004); hospitalization; ABI (2006); left heart catheterization with coronary angiogram (N/A, 02/22/2012); Breast lumpectomy with radioactive seed localization (Right, 01/03/2015); Total knee arthroplasty (Left, 02/13/2016); Breast excisional biopsy; Tongue Biopsy (12/12/2017); Colonoscopy; Upper gastrointestinal endoscopy; Hammer toe surgery (11/15/2020); and Total knee arthroplasty (Right, 05/22/2021). ? ?Brief Summary:  ?[name] is a 71 y.o. female former smoker with bronchiectasis and obstructive sleep apnea.  She had COVID pneumonia in October 2022. ?  ? ? ? ?Subjective:  ? ?Breathing continues to slowly improve.  Not having cough, wheeze, or sputum.  Sleeping okay.  No issues with CPAP.  Started on jardiance per Dr. Caryl Comes.  Not using oxygen, but still feels she needs to keep it for now.  Hb from 11/20/21 up to 13.2. ? ?Physical Exam:  ? ?Appearance - well kempt  ? ?ENMT - no sinus tenderness, no oral exudate, no LAN, Mallampati 3 airway, no stridor ? ?Respiratory - equal breath sounds bilaterally, no wheezing or  rales ? ?CV - s1s2 regular rate and rhythm, no murmurs ? ?Ext - no clubbing, no edema ? ?Skin - no rashes ? ?Psych - normal mood and affect ? ? ?  ?Pulmonary testing:  ?PFT 04/17/12 >> FEV1 2.60 (110%), FEV1% 85, TLC 4.05 (77%), DLCO 44% ?PFT 01/17/17 >> FEV1 2.38 (111%), FEV1% 90, TLC 3.85 (71%), DLCO 58%, no BD ?RAST 05/30/17 >> dust mites ?PFT 10/16/18 >> FEV1 2.27 (111%), FEV1% 90, TLC 4.00 (74%), DLCO 59% ? ?Serology:  ?04/02/12 >> ACE 1 ?10/25/15 >> ESR 40, ACE 32 ?05/30/17 >> IgA, IgG, IgM normal ?10/16/18 >> HP panel negative, ACE 25 ? ?Chest Imaging:  ?CT chest 04/10/12 >> b/l lower lobe cylindrical BTX and some in upper lobes ?CT angio chest 10/26/15 >> patchy GGO in periphery of lungs b/l, basilar BTX, no PE; no significant change compared to 2013 ?HRCT chest 10/08/18 >> atherosclerosis, basilar predominant cylindrical BTX, mild centrilobular/paraseptal emphysema, air trapping ?CT angio chest 03/24/19 >> Chronic lung changes with peribronchial thickening, bibasilar atelectasis and chronic interstitial disease/peripheral fibrosis at the lung bases ?CT angio chest 06/03/21 >> scattered GGO with fibrotic changes ?CT angio chest 08/30/21 >> BTX with emphysema and fibrotic changes in periphery ? ?Sleep Tests:  ?PSG 11/29/08 >> AHI 9 ?HST 11/10/15 >> AHI 7.1, SaO2 low 75% ?CPAP 12/11/20 to 01/09/21 >> used on 25 of 30 nights with average 4 hrs 49 min.  Average AHI 2.2 with CPAP 6 cm H2O. ? ?Cardiac Tests:  ?Echo 08/11/21 >> EF 50 to 55%, RVSP 39.5 mmHg ? ?Social History:  ?She  reports that she quit smoking  about 32 years ago. Her smoking use included cigarettes. She has a 15.00 pack-year smoking history. She has never used smokeless tobacco. She reports current alcohol use. She reports that she does not currently use drugs after having used the following drugs: Marijuana. ? ?Family History:  ?Her family history includes Allergies in her sister; Alzheimer's disease in her mother; Breast cancer in her cousin and maternal aunt;  Breast cancer (age of onset: 45) in her maternal aunt; Cancer (age of onset: 59) in her maternal grandmother; Colon cancer (age of onset: 39) in her maternal aunt; Diabetes in her mother; Heart disease in her brother, father, sister, and son; Hypertension in her mother; Lung cancer in her maternal uncle; Lung cancer (age of onset: 35) in her maternal grandfather; Obesity in her mother; Other in her brother; Pancreatic cancer (age of onset: 46) in her cousin; Prostate cancer (age of onset: 18) in her brother; Throat cancer in her brother. ?  ? ? ?Assessment/Plan:  ? ?History of COVID 19 pneumonia from October 2022. ?- she has changes of ILD on CT from prior COVID pneumonia, but has improvement on CT chest from January 2023 ?- plan to repeat HRCT chest and PFT later this year ? ?Chronic respiratory failure with hypoxia. ?- 2 liters oxygen prn with exertion ?- reassess home oxygen need at next follow up ? ?Bronchiectasis. ?- continue symbicort ?- prn albuterol, mucinex, flutter valve, hypertonic saline ? ?Upper airway cough syndrome with allergic rhinitis. ?- continue singulair, allegra, flonase, Navage nasal irrigation ?  ?Obstructive sleep apnea. ?- she is compliant with CPAP and reports benefit ?- she uses [employer] for her DME ?- continue CPAP 5 cm H2O ? ?Non-ischemic cardiomyopathy. ?- followed by Dr. Irish Lack and Dr. Caryl Comes with [place] ? ? ?Time Spent Involved in Patient Care on Day of Examination:  ?36 minutes ? ?Follow up:  ? ?Patient Instructions  ?Follow up in 4 months ? ?Medication List:  ? ?Allergies as of 12/05/2021   ? ?   Reactions  ? Ace Inhibitors Swelling  ? Angioedema; makes tongue "break out"  ? Amitriptyline Hcl Other (See Comments)  ?  makes her too sleepy!  ? Atorvastatin Other (See Comments)  ? SEVERE MYALGIA  ? Cymbalta [duloxetine Hcl] Nausea Only, Other (See Comments)  ? Sleepiness/ sick  ? Dilantin [phenytoin Sodium Extended] Rash  ? Severe rash  ? Paroxetine Nausea Only  ? Rapid  heartbeat  ? Ramipril Other (See Comments)  ? TONGUE ULCERS  ? Rosuvastatin Other (See Comments)  ? SEVERE MYALGIA  ? Carbamazepine Rash  ? Codeine Itching  ? Paxlovid [nirmatrelvir-ritonavir] Itching  ? Phenytoin Sodium Extended Rash  ? Simvastatin Other (See Comments)  ? Increase in CK, myalgias  ? Wellbutrin [bupropion] Palpitations  ? ?  ? ?  ?Medication List  ?  ? ?  ? Accurate as of December 05, 2021 12:41 PM. If you have any questions, ask your nurse or doctor.  ?  ?  ? ?  ? ?acetaminophen 500 MG tablet ?Commonly known as: TYLENOL ?Take 500 mg by mouth every 6 (six) hours as needed for moderate pain. ?  ?albuterol (2.5 MG/3ML) 0.083% nebulizer solution ?Commonly known as: PROVENTIL ?USE 1 VIAL IN NEBULIZER EVERY 6 HOURS ?What changed: See the new instructions. ?  ?albuterol 108 (90 Base) MCG/ACT inhaler ?Commonly known as: Ventolin HFA ?Inhale 2 puffs into the lungs every 6 (six) hours as needed for wheezing or shortness of breath. ?What changed: Another medication with  the same name was changed. Make sure you understand how and when to take each. ?  ?aspirin EC 81 MG tablet ?Take 81 mg by mouth daily. Swallow whole. ?  ?azelastine 0.1 % nasal spray ?Commonly known as: ASTELIN ?Place 1 spray into both nostrils 2 (two) times daily. Use in each nostril as directed ?  ?baclofen 10 MG tablet ?Commonly known as: LIORESAL ?Take 1 tablet (10 mg total) by mouth at bedtime as needed for muscle spasms. ?  ?budesonide-formoterol 80-4.5 MCG/ACT inhaler ?Commonly known as: SYMBICORT ?INHALE 2 PUFFS TWICE DAILY IN THE MORNING AND AT BEDTIME ?  ?carvedilol 12.5 MG tablet ?Commonly known as: COREG ?Take 0.5 tablets (6.25 mg total) by mouth 2 (two) times daily for 5 days, THEN 0.5 tablets (6.25 mg total) daily for 5 days. Stop at end of taper. ?Start taking on: November 20, 2021 ?  ?cetirizine 10 MG tablet ?Commonly known as: ZYRTEC ?Take 10 mg by mouth daily as needed for allergies. ?  ?Chloraseptic 1.4 % Liqd ?Generic drug:  phenol ?Use as directed 1 spray in the mouth or throat 3 (three) times daily as needed for throat irritation / pain. ?  ?Dialyvite Vitamin D 5000 125 MCG (5000 UT) capsule ?Generic drug: Cholecalciferol ?Take 5,000 Units by m

## 2021-12-14 ENCOUNTER — Ambulatory Visit: Payer: Medicare Other

## 2021-12-15 NOTE — Progress Notes (Signed)
Remote ICD transmission.   

## 2021-12-19 ENCOUNTER — Other Ambulatory Visit (HOSPITAL_COMMUNITY)
Admission: RE | Admit: 2021-12-19 | Discharge: 2021-12-19 | Disposition: A | Discharge status: Home / Self Care | Payer: Medicare Other | Source: Ambulatory Visit | Attending: Family | Admitting: Family

## 2021-12-19 ENCOUNTER — Ambulatory Visit (INDEPENDENT_AMBULATORY_CARE_PROVIDER_SITE_OTHER): Payer: Medicare Other | Admitting: Family

## 2021-12-19 ENCOUNTER — Encounter: Payer: Self-pay | Admitting: Family

## 2021-12-19 VITALS — BP 118/73 | HR 82 | Temp 98.3°F | Resp 18 | Ht 66.5 in | Wt 214.0 lb

## 2021-12-19 DIAGNOSIS — M816 Localized osteoporosis [Lequesne]: Secondary | ICD-10-CM

## 2021-12-19 DIAGNOSIS — N898 Other specified noninflammatory disorders of vagina: Secondary | ICD-10-CM | POA: Diagnosis not present

## 2021-12-19 DIAGNOSIS — R7303 Prediabetes: Secondary | ICD-10-CM | POA: Diagnosis not present

## 2021-12-19 DIAGNOSIS — Z0001 Encounter for general adult medical examination with abnormal findings: Secondary | ICD-10-CM

## 2021-12-19 DIAGNOSIS — Z0000 Encounter for general adult medical examination without abnormal findings: Secondary | ICD-10-CM

## 2021-12-19 DIAGNOSIS — Z13820 Encounter for screening for osteoporosis: Secondary | ICD-10-CM

## 2021-12-19 LAB — POCT URINALYSIS DIP (CLINITEK)
Bilirubin, UA: NEGATIVE
Blood, UA: NEGATIVE
Glucose, UA: >=1000 mg/dL — AB
Leukocytes, UA: NEGATIVE
Nitrite, UA: NEGATIVE
POC PROTEIN,UA: NEGATIVE
Spec Grav, UA: 1.015 (ref 1.010–1.025)
Urobilinogen, UA: 0.2 E.U./dL
pH, UA: 6 (ref 5.0–8.0)

## 2021-12-19 LAB — POCT GLYCOSYLATED HEMOGLOBIN (HGB A1C): Hemoglobin A1C: 5.7 % — AB (ref 4.0–5.6)

## 2021-12-19 MED ORDER — FLUCONAZOLE 150 MG PO TABS: 150.0000 mg | ORAL_TABLET | Freq: Once | ORAL | 0 refills | Status: AC

## 2021-12-19 NOTE — Progress Notes (Signed)
No urinary tract infection.  ? ?Prediabetes discussed in office.

## 2021-12-19 NOTE — Progress Notes (Signed)
? ?Subjective:  ? [name] is a 71 y.o. female who presents for Medicare Annual (Subsequent) preventive examination. ? ?Review of Systems    ?Reports she is established with Urogynecology for overactive bladder. Reports recently heart doctor began her on Jardiance and having more increased urination and vaginal itching symptoms of yeast infection since then. Wants to know if she should continue taking Jardiance and Lasix since they may both be causing frequent urination. Plans to ask heart doctor soon. ?  ? ?   ?Objective:  ?  ?Today's Vitals  ? 12/19/21 1036  ?BP: 118/73  ?Pulse: 82  ?Resp: 18  ?Temp: 98.3 ?F (36.8 ?C)  ?SpO2: 92%  ?Weight: 214 lb (97.1 kg)  ?Height: 5' 6.5" (1.689 m)  ?PainSc: 0-No pain  ? ?Body mass index is 34.03 kg/m?. ? ?Physical Exam ?HENT:  ?   Head: Normocephalic and atraumatic.  ?Eyes:  ?   Extraocular Movements: Extraocular movements intact.  ?   Conjunctiva/sclera: Conjunctivae normal.  ?   Pupils: Pupils are equal, round, and reactive to light.  ?Cardiovascular:  ?   Rate and Rhythm: Normal rate and regular rhythm.  ?   Pulses: Normal pulses.  ?   Heart sounds: Normal heart sounds.  ?Pulmonary:  ?   Effort: Pulmonary effort is normal.  ?   Breath sounds: Normal breath sounds.  ?Musculoskeletal:  ?   Cervical back: Normal range of motion and neck supple.  ?Neurological:  ?   General: No focal deficit present.  ?   Mental Status: She is alert and oriented to person, place, and time.  ?Psychiatric:     ?   Mood and Affect: Mood normal.     ?   Behavior: Behavior normal.  ? ?Results for orders placed or performed in visit on 12/19/21  ?POCT glycosylated hemoglobin (Hb A1C)  ?Result Value Ref Range  ? Hemoglobin A1C 5.7 (A) 4.0 - 5.6 %  ? HbA1c POC (<> result, manual entry)    ? HbA1c, POC (prediabetic range)    ? HbA1c, POC (controlled diabetic range)    ?POCT URINALYSIS DIP (CLINITEK)  ?Result Value Ref Range  ? Color, UA yellow yellow  ? Clarity, UA clear clear  ? Glucose, UA  >=1,000 (A) negative mg/dL  ? Bilirubin, UA negative negative  ? Ketones, POC UA trace (5) (A) negative mg/dL  ? Spec Grav, UA 1.015 1.010 - 1.025  ? Blood, UA negative negative  ? pH, UA 6.0 5.0 - 8.0  ? POC PROTEIN,UA negative negative, trace  ? Urobilinogen, UA 0.2 0.2 or 1.0 E.U./dL  ? Nitrite, UA Negative Negative  ? Leukocytes, UA Negative Negative  ? ? ? ?  06/03/2021  ?  6:00 PM 06/03/2021  ?  9:12 AM 05/22/2021  ?  7:52 AM 04/26/2021  ? 12:52 PM 12/08/2020  ? 11:11 AM 12/07/2019  ? 12:02 PM 07/22/2019  ? 11:37 AM  ?Advanced Directives  ?Does Patient Have a Medical Advance Directive? Yes Yes No No;Yes Yes Yes Yes  ?Type of Advance Directive Living will Living will  Living will Healthcare Power of [place];Living will Healthcare Power of Attorney  ?Does patient want to make changes to medical advance directive? No - Patient declined      No - Patient declined  ?Copy of [place] in Chart?     No - copy requested No - copy requested   ?Would patient like information on creating a medical  advance directive?  No - Patient declined No - Patient declined      ? ? ?Current Medications (verified) ?Outpatient Encounter Medications as of 12/19/2021  ?Medication Sig  ? acetaminophen (TYLENOL) 500 MG tablet Take 500 mg by mouth every 6 (six) hours as needed for moderate pain.  ? albuterol (PROVENTIL) (2.5 MG/3ML) 0.083% nebulizer solution USE 1 VIAL IN NEBULIZER EVERY 6 HOURS (Patient taking differently: Take 2.5 mg by nebulization every 6 (six) hours as needed for shortness of breath.)  ? albuterol (VENTOLIN HFA) 108 (90 Base) MCG/ACT inhaler Inhale 2 puffs into the lungs every 6 (six) hours as needed for wheezing or shortness of breath.  ? Ascorbic Acid (VITAMIN C) 1000 MG tablet Take 1,000 mg by mouth daily.  ? aspirin EC 81 MG tablet Take 81 mg by mouth daily. Swallow whole.  ? azelastine (ASTELIN) 0.1 % nasal spray Place 1 spray into both nostrils 2 (two) times daily. Use in  each nostril as directed  ? baclofen (LIORESAL) 10 MG tablet Take 1 tablet (10 mg total) by mouth at bedtime as needed for muscle spasms.  ? budesonide-formoterol (SYMBICORT) 80-4.5 MCG/ACT inhaler INHALE 2 PUFFS TWICE DAILY IN THE MORNING AND AT BEDTIME  ? carvedilol (COREG) 12.5 MG tablet Take 0.5 tablets (6.25 mg total) by mouth 2 (two) times daily for 5 days, THEN 0.5 tablets (6.25 mg total) daily for 5 days. Stop at end of taper.  ? cetirizine (ZYRTEC) 10 MG tablet Take 10 mg by mouth daily as needed for allergies.  ? Cholecalciferol (DIALYVITE VITAMIN D 5000) 125 MCG (5000 UT) capsule Take 5,000 Units by mouth daily.  ? diclofenac Sodium (VOLTAREN) 1 % GEL Apply 2-4 grams to affected joint 4 times daily as needed. (Patient taking differently: Apply 2-4 g topically 4 (four) times daily as needed (joint pain).)  ? diltiazem (CARDIZEM CD) 120 MG 24 hr capsule Take 1 capsule (120 mg total) by mouth daily. Do Not start this medication until after you wean off Carvedilol.  ? empagliflozin (JARDIANCE) 10 MG TABS tablet Take 1 tablet (10 mg total) by mouth daily.  ? ezetimibe (ZETIA) 10 MG tablet Take 1 tablet (10 mg total) by mouth daily.  ? fexofenadine (ALLEGRA) 180 MG tablet Take 180 mg by mouth daily as needed for allergies (alternates with zyrtec sometimes).  ? fluticasone (FLONASE) 50 MCG/ACT nasal spray Place 2 sprays into both nostrils daily as needed for allergies or rhinitis.  ? furosemide (LASIX) 20 MG tablet Take 40 mg (2 tabs) 3 days per week. Take 20 mg the other days of the week.  ? hydroxypropyl methylcellulose / hypromellose (ISOPTO TEARS / GONIOVISC) 2.5 % ophthalmic solution Place 1 drop into both eyes at bedtime.  ? latanoprost (XALATAN) 0.005 % ophthalmic solution Place 1 drop into both eyes at bedtime.  ? levothyroxine (SYNTHROID) 50 MCG tablet Take 1 tablet (50 mcg total) by mouth daily.  ? magnesium oxide (MAG-OX) 400 MG tablet Take 1 tablet (400 mg total) by mouth 2 (two) times daily.  ?  methocarbamol (ROBAXIN) 500 MG tablet Take 1-2 tablets (500-1,000 mg total) by mouth every 6 (six) hours as needed for muscle spasms.  ? mometasone (ELOCON) 0.1 % lotion Apply 1 application topically daily as needed (rash).  ? montelukast (SINGULAIR) 10 MG tablet TAKE 1 TABLET AT BEDTIME  ? Multiple Vitamins-Minerals (MULTIVITAMIN PO) Take 1 tablet by mouth daily.  ? nitroGLYCERIN (NITROSTAT) 0.4 MG SL tablet DISSOLVE ONE TABLET UNDER THE TONGUE EVERY 5 MINUTES  AS NEEDED FOR CHEST PAIN.  DO NOT EXCEED A TOTAL OF 3 DOSES IN 15 MINUTES (Patient taking differently: Place 0.4 mg under the tongue every 5 (five) minutes as needed for chest pain.)  ? pantoprazole (PROTONIX) 40 MG tablet TAKE 1 TABLET EVERY DAY  ? phenol (CHLORASEPTIC) 1.4 % LIQD Use as directed 1 spray in the mouth or throat 3 (three) times daily as needed for throat irritation / pain.  ? polyethylene glycol powder (GLYCOLAX/MIRALAX) powder Take 17 grams by mouth daily  ? potassium chloride (KLOR-CON M) 10 MEQ tablet TAKE 1/2 TABLET EVERY DAY (CALL OFFICE TO SCHEDULE YEARLY APPT)  ? Respiratory Therapy Supplies (FLUTTER) DEVI 1 Device by Does not apply route as needed.  ? sodium chloride HYPERTONIC 3 % nebulizer solution Take by nebulization 2 (two) times daily as needed for cough. 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  ? sodium chloride HYPERTONIC 3 % nebulizer solution Take by nebulization in the morning and at bedtime.  ? traMADol (ULTRAM) 50 MG tablet Take 1 tablet (50 mg total) by mouth every 12 (twelve) hours as needed.  ? traZODone (DESYREL) 100 MG tablet Take 1-1.5 tablets (100-150 mg total) by mouth at bedtime.  ? ?No facility-administered encounter medications on file as of 12/19/2021.  ? ? ?Allergies (verified) ?Ace inhibitors, Amitriptyline hcl, Atorvastatin, Cymbalta [duloxetine hcl], Dilantin [phenytoin sodium extended], Paroxetine, Ramipril, Rosuvastatin, Carbamazepine, Codeine, Paxlovid [nirmatrelvir-ritonavir], Phenytoin sodium extended, Simvastatin, and  Wellbutrin [bupropion]  ? ?History: ?Past Medical History:  ?Diagnosis Date  ? AICD (automatic cardioverter/defibrillator) present   ? Anxiety   ? Arthritis   ? Back pain   ? Breast cancer ([place]) 2016  ? DCIS

## 2021-12-19 NOTE — Progress Notes (Signed)
? ?Subjective:  ? [name] is a 71 y.o. female who presents for Medicare Annual (Subsequent) preventive examination. ? ?Review of Systems    ?Defer to PCP ?  ? ?   ?Objective:  ?  ?Today's Vitals  ? 12/19/21 1036  ?BP: 118/73  ?Pulse: 82  ?Resp: 18  ?Temp: 98.3 ?F (36.8 ?C)  ?SpO2: 92%  ?Weight: 214 lb (97.1 kg)  ?Height: 5' 6.5" (1.689 m)  ?PainSc: 0-No pain  ? ?Body mass index is 34.03 kg/m?. ? ? ?  06/03/2021  ?  6:00 PM 06/03/2021  ?  9:12 AM 05/22/2021  ?  7:52 AM 04/26/2021  ? 12:52 PM 12/08/2020  ? 11:11 AM 12/07/2019  ? 12:02 PM 07/22/2019  ? 11:37 AM  ?Advanced Directives  ?Does Patient Have a Medical Advance Directive? Yes Yes No No;Yes Yes Yes Yes  ?Type of Advance Directive Living will Living will  Living will Healthcare Power of [place];Living will Healthcare Power of Attorney  ?Does patient want to make changes to medical advance directive? No - Patient declined      No - Patient declined  ?Copy of [place] in Chart?     No - copy requested No - copy requested   ?Would patient like information on creating a medical advance directive?  No - Patient declined No - Patient declined      ? ? ?Current Medications (verified) ?Outpatient Encounter Medications as of 12/19/2021  ?Medication Sig  ? acetaminophen (TYLENOL) 500 MG tablet Take 500 mg by mouth every 6 (six) hours as needed for moderate pain.  ? albuterol (PROVENTIL) (2.5 MG/3ML) 0.083% nebulizer solution USE 1 VIAL IN NEBULIZER EVERY 6 HOURS (Patient taking differently: Take 2.5 mg by nebulization every 6 (six) hours as needed for shortness of breath.)  ? albuterol (VENTOLIN HFA) 108 (90 Base) MCG/ACT inhaler Inhale 2 puffs into the lungs every 6 (six) hours as needed for wheezing or shortness of breath.  ? Ascorbic Acid (VITAMIN C) 1000 MG tablet Take 1,000 mg by mouth daily.  ? aspirin EC 81 MG tablet Take 81 mg by mouth daily. Swallow whole.  ? azelastine (ASTELIN) 0.1 % nasal spray Place 1 spray  into both nostrils 2 (two) times daily. Use in each nostril as directed  ? baclofen (LIORESAL) 10 MG tablet Take 1 tablet (10 mg total) by mouth at bedtime as needed for muscle spasms.  ? budesonide-formoterol (SYMBICORT) 80-4.5 MCG/ACT inhaler INHALE 2 PUFFS TWICE DAILY IN THE MORNING AND AT BEDTIME  ? carvedilol (COREG) 12.5 MG tablet Take 0.5 tablets (6.25 mg total) by mouth 2 (two) times daily for 5 days, THEN 0.5 tablets (6.25 mg total) daily for 5 days. Stop at end of taper.  ? cetirizine (ZYRTEC) 10 MG tablet Take 10 mg by mouth daily as needed for allergies.  ? Cholecalciferol (DIALYVITE VITAMIN D 5000) 125 MCG (5000 UT) capsule Take 5,000 Units by mouth daily.  ? diclofenac Sodium (VOLTAREN) 1 % GEL Apply 2-4 grams to affected joint 4 times daily as needed. (Patient taking differently: Apply 2-4 g topically 4 (four) times daily as needed (joint pain).)  ? diltiazem (CARDIZEM CD) 120 MG 24 hr capsule Take 1 capsule (120 mg total) by mouth daily. Do Not start this medication until after you wean off Carvedilol.  ? empagliflozin (JARDIANCE) 10 MG TABS tablet Take 1 tablet (10 mg total) by mouth daily.  ? ezetimibe (ZETIA) 10 MG tablet Take 1 tablet (  10 mg total) by mouth daily.  ? fexofenadine (ALLEGRA) 180 MG tablet Take 180 mg by mouth daily as needed for allergies (alternates with zyrtec sometimes).  ? fluticasone (FLONASE) 50 MCG/ACT nasal spray Place 2 sprays into both nostrils daily as needed for allergies or rhinitis.  ? furosemide (LASIX) 20 MG tablet Take 40 mg (2 tabs) 3 days per week. Take 20 mg the other days of the week.  ? hydroxypropyl methylcellulose / hypromellose (ISOPTO TEARS / GONIOVISC) 2.5 % ophthalmic solution Place 1 drop into both eyes at bedtime.  ? latanoprost (XALATAN) 0.005 % ophthalmic solution Place 1 drop into both eyes at bedtime.  ? levothyroxine (SYNTHROID) 50 MCG tablet Take 1 tablet (50 mcg total) by mouth daily.  ? magnesium oxide (MAG-OX) 400 MG tablet Take 1 tablet (400  mg total) by mouth 2 (two) times daily.  ? methocarbamol (ROBAXIN) 500 MG tablet Take 1-2 tablets (500-1,000 mg total) by mouth every 6 (six) hours as needed for muscle spasms.  ? mometasone (ELOCON) 0.1 % lotion Apply 1 application topically daily as needed (rash).  ? montelukast (SINGULAIR) 10 MG tablet TAKE 1 TABLET AT BEDTIME  ? Multiple Vitamins-Minerals (MULTIVITAMIN PO) Take 1 tablet by mouth daily.  ? nitroGLYCERIN (NITROSTAT) 0.4 MG SL tablet DISSOLVE ONE TABLET UNDER THE TONGUE EVERY 5 MINUTES AS NEEDED FOR CHEST PAIN.  DO NOT EXCEED A TOTAL OF 3 DOSES IN 15 MINUTES (Patient taking differently: Place 0.4 mg under the tongue every 5 (five) minutes as needed for chest pain.)  ? pantoprazole (PROTONIX) 40 MG tablet TAKE 1 TABLET EVERY DAY  ? phenol (CHLORASEPTIC) 1.4 % LIQD Use as directed 1 spray in the mouth or throat 3 (three) times daily as needed for throat irritation / pain.  ? polyethylene glycol powder (GLYCOLAX/MIRALAX) powder Take 17 grams by mouth daily  ? potassium chloride (KLOR-CON M) 10 MEQ tablet TAKE 1/2 TABLET EVERY DAY (CALL OFFICE TO SCHEDULE YEARLY APPT)  ? Respiratory Therapy Supplies (FLUTTER) DEVI 1 Device by Does not apply route as needed.  ? sodium chloride HYPERTONIC 3 % nebulizer solution Take by nebulization 2 (two) times daily as needed for cough. 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  ? sodium chloride HYPERTONIC 3 % nebulizer solution Take by nebulization in the morning and at bedtime.  ? traMADol (ULTRAM) 50 MG tablet Take 1 tablet (50 mg total) by mouth every 12 (twelve) hours as needed.  ? traZODone (DESYREL) 100 MG tablet Take 1-1.5 tablets (100-150 mg total) by mouth at bedtime.  ? ?No facility-administered encounter medications on file as of 12/19/2021.  ? ? ?Allergies (verified) ?Ace inhibitors, Amitriptyline hcl, Atorvastatin, Cymbalta [duloxetine hcl], Dilantin [phenytoin sodium extended], Paroxetine, Ramipril, Rosuvastatin, Carbamazepine, Codeine, Paxlovid [nirmatrelvir-ritonavir],  Phenytoin sodium extended, Simvastatin, and Wellbutrin [bupropion]  ? ?History: ?Past Medical History:  ?Diagnosis Date  ? AICD (automatic cardioverter/defibrillator) present   ? Anxiety   ? Arthritis   ? Back pain   ? Breast cancer ([place]) 2016  ? 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 Bronchiectasis ([place])   ? Cerebral aneurysm, nonruptured   ? had a clip put in  ? CHF (congestive heart failure) ([place])   ? Clostridium difficile infection   ? Depressive disorder, not elsewhere classified   ? Diverticulosis of colon (without mention of hemorrhage)   ? Esophageal reflux   ? Fatty liver   ? Fibromyalgia   ? Gastritis   ? GERD (gastroesophageal reflux disease)   ? GI bleed 2004  ? Glaucoma   ?  Hiatal hernia   ? History of COVID-19 05/04/2021  ? Hyperlipidemia   ? Hypertension   ? Hypothyroidism   ? Internal hemorrhoids   ? Joint pain   ? Multifocal pneumonia 06/03/2021  ? Obstructive sleep apnea (adult) (pediatric)   ? Osteoarthritis   ? Ostium secundum type atrial septal defect   ? Other chronic nonalcoholic liver disease   ? Other pulmonary embolism and infarction   ? Palpitations   ? Paroxysmal ventricular tachycardia ([place])   ? Personal history of radiation therapy   ? PONV (postoperative nausea and vomiting)   ? Presence of permanent cardiac pacemaker   ? PUD (peptic ulcer disease)   ? Radiation 02/03/15-03/10/15  ? Right Breast  ? Sarcoid   ? per pt , not sure  ? Schatzki's ring   ? Shortness of breath   ? Sleep apnea   ? Stroke [facility] 2013  ? tia/ pt feels it was around 2008 0r 2009  ? Takotsubo syndrome   ? Tubular adenoma of colon   ? Unspecified transient cerebral ischemia   ? Unspecified vitamin D deficiency   ? ?Past Surgical History:  ?Procedure Laterality Date  ? ABI  2006  ? normal  ? BREAST EXCISIONAL BIOPSY    ? BREAST LUMPECTOMY WITH RADIOACTIVE SEED LOCALIZATION Right 01/03/2015  ? Procedure: BREAST LUMPECTOMY WITH RADIOACTIVE SEED LOCALIZATION;  Surgeon: Autumn Messing III, MD;  Location: [facility];  Service:  General;  Laterality: Right;  ? CARDIAC DEFIBRILLATOR PLACEMENT  2006; 2012  ? BSX single chamber ICD  ? carotid dopplers  2006  ? neg  ? CEREBRAL ANEURYSM REPAIR  02/1999  ? COLONOSCOPY    ? HAMMER TOE SURGE

## 2021-12-19 NOTE — Progress Notes (Deleted)
? ?Annual Wellness Visit ? ?  ? ?Patient: [name], Female    DOB: 04/10/1951, 71 y.o.   MRN: [id number] ? ?Subjective  ?No chief complaint on file. ? ? ?[name] is a 71 y.o. female who presents today for her Annual Wellness Visit. ?She reports consuming a {diet types:17450} diet. {Exercise:19826} She generally feels {well/fairly well/poorly:18703}. She reports sleeping {well/fairly well/poorly:18703}. She {does/does not:200015} have additional problems to discuss today.  ? ?HPI ? ?{VISON DENTAL STD PSA (Optional):27386} ? ? ?{History (Optional):23778} ? ?Medications: ?Outpatient Medications Prior to Visit  ?Medication Sig  ? acetaminophen (TYLENOL) 500 MG tablet Take 500 mg by mouth every 6 (six) hours as needed for moderate pain.  ? albuterol (PROVENTIL) (2.5 MG/3ML) 0.083% nebulizer solution USE 1 VIAL IN NEBULIZER EVERY 6 HOURS (Patient taking differently: Take 2.5 mg by nebulization every 6 (six) hours as needed for shortness of breath.)  ? albuterol (VENTOLIN HFA) 108 (90 Base) MCG/ACT inhaler Inhale 2 puffs into the lungs every 6 (six) hours as needed for wheezing or shortness of breath.  ? Ascorbic Acid (VITAMIN C) 1000 MG tablet Take 1,000 mg by mouth daily.  ? aspirin EC 81 MG tablet Take 81 mg by mouth daily. Swallow whole.  ? azelastine (ASTELIN) 0.1 % nasal spray Place 1 spray into both nostrils 2 (two) times daily. Use in each nostril as directed  ? baclofen (LIORESAL) 10 MG tablet Take 1 tablet (10 mg total) by mouth at bedtime as needed for muscle spasms.  ? budesonide-formoterol (SYMBICORT) 80-4.5 MCG/ACT inhaler INHALE 2 PUFFS TWICE DAILY IN THE MORNING AND AT BEDTIME  ? carvedilol (COREG) 12.5 MG tablet Take 0.5 tablets (6.25 mg total) by mouth 2 (two) times daily for 5 days, THEN 0.5 tablets (6.25 mg total) daily for 5 days. Stop at end of taper.  ? cetirizine (ZYRTEC) 10 MG tablet Take 10 mg by mouth daily as needed for allergies.  ? Cholecalciferol (DIALYVITE VITAMIN D 5000)  125 MCG (5000 UT) capsule Take 5,000 Units by mouth daily.  ? diclofenac Sodium (VOLTAREN) 1 % GEL Apply 2-4 grams to affected joint 4 times daily as needed. (Patient taking differently: Apply 2-4 g topically 4 (four) times daily as needed (joint pain).)  ? diltiazem (CARDIZEM CD) 120 MG 24 hr capsule Take 1 capsule (120 mg total) by mouth daily. Do Not start this medication until after you wean off Carvedilol.  ? empagliflozin (JARDIANCE) 10 MG TABS tablet Take 1 tablet (10 mg total) by mouth daily.  ? ezetimibe (ZETIA) 10 MG tablet Take 1 tablet (10 mg total) by mouth daily.  ? fexofenadine (ALLEGRA) 180 MG tablet Take 180 mg by mouth daily as needed for allergies (alternates with zyrtec sometimes).  ? fluticasone (FLONASE) 50 MCG/ACT nasal spray Place 2 sprays into both nostrils daily as needed for allergies or rhinitis.  ? furosemide (LASIX) 20 MG tablet Take 40 mg (2 tabs) 3 days per week. Take 20 mg the other days of the week.  ? hydroxypropyl methylcellulose / hypromellose (ISOPTO TEARS / GONIOVISC) 2.5 % ophthalmic solution Place 1 drop into both eyes at bedtime.  ? latanoprost (XALATAN) 0.005 % ophthalmic solution Place 1 drop into both eyes at bedtime.  ? levothyroxine (SYNTHROID) 50 MCG tablet Take 1 tablet (50 mcg total) by mouth daily.  ? magnesium oxide (MAG-OX) 400 MG tablet Take 1 tablet (400 mg total) by mouth 2 (two) times daily.  ? methocarbamol (ROBAXIN) 500 MG tablet Take 1-2 tablets (500-1,000  mg total) by mouth every 6 (six) hours as needed for muscle spasms.  ? mometasone (ELOCON) 0.1 % lotion Apply 1 application topically daily as needed (rash).  ? montelukast (SINGULAIR) 10 MG tablet TAKE 1 TABLET AT BEDTIME  ? Multiple Vitamins-Minerals (MULTIVITAMIN PO) Take 1 tablet by mouth daily.  ? nitroGLYCERIN (NITROSTAT) 0.4 MG SL tablet DISSOLVE ONE TABLET UNDER THE TONGUE EVERY 5 MINUTES AS NEEDED FOR CHEST PAIN.  DO NOT EXCEED A TOTAL OF 3 DOSES IN 15 MINUTES (Patient taking differently: Place  0.4 mg under the tongue every 5 (five) minutes as needed for chest pain.)  ? pantoprazole (PROTONIX) 40 MG tablet TAKE 1 TABLET EVERY DAY  ? phenol (CHLORASEPTIC) 1.4 % LIQD Use as directed 1 spray in the mouth or throat 3 (three) times daily as needed for throat irritation / pain.  ? polyethylene glycol powder (GLYCOLAX/MIRALAX) powder Take 17 grams by mouth daily  ? potassium chloride (KLOR-CON M) 10 MEQ tablet TAKE 1/2 TABLET EVERY DAY (CALL OFFICE TO SCHEDULE YEARLY APPT)  ? Respiratory Therapy Supplies (FLUTTER) DEVI 1 Device by Does not apply route as needed.  ? sodium chloride HYPERTONIC 3 % nebulizer solution Take by nebulization 2 (two) times daily as needed for cough. 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  ? sodium chloride HYPERTONIC 3 % nebulizer solution Take by nebulization in the morning and at bedtime.  ? traMADol (ULTRAM) 50 MG tablet Take 1 tablet (50 mg total) by mouth every 12 (twelve) hours as needed.  ? traZODone (DESYREL) 100 MG tablet Take 1-1.5 tablets (100-150 mg total) by mouth at bedtime.  ? ?No facility-administered medications prior to visit.  ?  ?Allergies  ?Allergen Reactions  ? Ace Inhibitors Swelling  ?  Angioedema; makes tongue "break out" ?  ? Amitriptyline Hcl Other (See Comments)  ?   makes her too sleepy!  ? Atorvastatin Other (See Comments)  ?  SEVERE MYALGIA  ? Cymbalta [Duloxetine Hcl] Nausea Only and Other (See Comments)  ?  Sleepiness/ sick  ? Dilantin [Phenytoin Sodium Extended] Rash  ?  Severe rash  ? Paroxetine Nausea Only  ?  Rapid heartbeat  ? Ramipril Other (See Comments)  ?  TONGUE ULCERS ?  ? Rosuvastatin Other (See Comments)  ?  SEVERE MYALGIA  ? Carbamazepine Rash  ? Codeine Itching  ? Paxlovid [Nirmatrelvir-Ritonavir] Itching  ? Phenytoin Sodium Extended Rash  ? Simvastatin Other (See Comments)  ?  Increase in CK, myalgias  ? Wellbutrin [Bupropion] Palpitations  ? ? ?Patient Care Team: ?Camillia Herter, NP as PCP - General (Nurse Practitioner) ?Deboraha Sprang, MD as PCP -  Electrophysiology (Cardiology) ?Jettie Booze, MD as PCP - Cardiology (Cardiology) ?Jovita Kussmaul, MD as Consulting Physician (General Surgery) ?Truitt Merle, MD as Consulting Physician (Hematology) ?Thea Silversmith, MD as Consulting Physician (Radiation Oncology) ?Rockwell Germany, RN as Registered Nurse ?Mauro Kaufmann, RN as Registered Nurse ?Deboraha Sprang, MD as Consulting Physician (Cardiology) ?Holley Bouche, NP (Inactive) as Nurse Practitioner (Nurse Practitioner) ?Sylvan Cheese, NP as Nurse Practitioner (Nurse Practitioner) ?Chesley Mires, MD as Consulting Physician (Pulmonary Disease) ?Jovita Kussmaul, MD as Consulting Physician (General Surgery) ?Bo Merino, MD as Consulting Physician (Rheumatology) ? ?ROS ? ? ?  ? ?Objective  ?LMP  (LMP Unknown)  ?{Vitals History (Optional):23777} ? ?Physical Exam ? ? ? ?Most recent functional status assessment: ? ?  06/03/2021  ?  3:24 PM  ?In your present state of health, do you have any difficulty performing  the following activities:  ?Doing errands, shopping? 0  ? ?Most recent fall risk assessment: ? ?  11/13/2021  ?  1:44 PM  ?Fall Risk   ?Falls in the past year? 0  ?Number falls in past yr: 0  ? ? Most recent depression screenings: ? ?  11/13/2021  ?  1:44 PM 09/15/2021  ?  1:15 PM  ?PHQ 2/9 Scores  ?PHQ - 2 Score 0 0  ?PHQ- 9 Score  0  ? ?Most recent cognitive screening: ? ?  12/08/2020  ? 11:15 AM  ?6CIT Screen  ?What Year? 0 points  ?What month? 0 points  ?Count back from 20 0 points  ?Months in reverse 0 points  ?Repeat phrase 0 points  ? ?Most recent Audit-C alcohol use screening ? ?  12/07/2019  ? 12:13 PM  ?Alcohol Use Disorder Test (AUDIT)  ?1. How often do you have a drink containing alcohol? 1  ?2. How many drinks containing alcohol do you have on a typical day when you are drinking? 0  ?3. How often do you have six or more drinks on one occasion? 0  ?AUDIT-C Score 1  ?Alcohol Brief Interventions/Follow-up AUDIT Score <7 follow-up  not indicated  ? ?A score of 3 or more in women, and 4 or more in men indicates increased risk for alcohol abuse, EXCEPT if all of the points are from question 1  ? ?Vision/Hearing Screen: ?No results found

## 2021-12-21 LAB — CERVICOVAGINAL ANCILLARY ONLY

## 2021-12-21 NOTE — Progress Notes (Signed)
Please call our office to schedule repeat testing for cervicovaginal ancillary.

## 2021-12-26 ENCOUNTER — Telehealth: Payer: Self-pay | Admitting: Internal Medicine

## 2021-12-26 NOTE — Telephone Encounter (Signed)
New Message: ? ? ?Patient wanted to know if a patient have a device and pass away. Will the device show the exact time they died? ?

## 2022-01-02 ENCOUNTER — Encounter: Payer: Self-pay | Admitting: Internal Medicine

## 2022-01-09 ENCOUNTER — Encounter: Payer: Medicare Other | Attending: Obstetrics and Gynecology | Admitting: Physical Therapy

## 2022-01-09 ENCOUNTER — Encounter: Payer: Self-pay | Admitting: Physical Therapy

## 2022-01-09 DIAGNOSIS — R278 Other lack of coordination: Secondary | ICD-10-CM | POA: Diagnosis not present

## 2022-01-09 DIAGNOSIS — N3281 Overactive bladder: Secondary | ICD-10-CM | POA: Insufficient documentation

## 2022-01-09 DIAGNOSIS — R3915 Urgency of urination: Secondary | ICD-10-CM | POA: Diagnosis present

## 2022-01-09 DIAGNOSIS — M6281 Muscle weakness (generalized): Secondary | ICD-10-CM

## 2022-01-09 NOTE — Therapy (Signed)
[facility] at [facility] [address], [place], [place], [address] Phone: [phone]   Fax:  [phone]  Patient Details  Name: [name] MRN: [id number] Date of Birth: 1950-10-16 Referring Provider:  Jaquita Folds, *  Encounter Date: 01/09/2022  OUTPATIENT PHYSICAL THERAPY FEMALE PELVIC EVALUATION   Patient Name: [name] MRN: [id number] DOB:[birth year], 71 y.o., female Today's Date: 01/09/2022    Past Medical History:  Diagnosis Date   AICD (automatic cardioverter/defibrillator) present    Anxiety    Arthritis    Back pain    Breast cancer ([place]) 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   Bronchiectasis ([place])    Cerebral aneurysm, nonruptured    had a clip put in   CHF (congestive heart failure) ([place])    Clostridium difficile infection    Depressive disorder, not elsewhere classified    Diverticulosis of colon (without mention of hemorrhage)    Esophageal reflux    Fatty liver    Fibromyalgia    Gastritis    GERD (gastroesophageal reflux disease)    GI bleed 2004   Glaucoma    Hiatal hernia    History of COVID-19 05/04/2021   Hyperlipidemia    Hypertension    Hypothyroidism    Internal hemorrhoids    Joint pain    Multifocal pneumonia 06/03/2021   Obstructive sleep apnea (adult) (pediatric)    Osteoarthritis    Ostium secundum type atrial septal defect    Other chronic nonalcoholic liver disease    Other pulmonary embolism and infarction    Palpitations    Paroxysmal ventricular tachycardia ([place])    Personal history of radiation therapy    PONV (postoperative nausea and vomiting)    Presence of permanent cardiac pacemaker    PUD (peptic ulcer disease)    Radiation 02/03/15-03/10/15   Right Breast   Sarcoid    per pt , not sure   Schatzki's ring    Shortness of breath    Sleep apnea    Stroke [facility]) 2013   tia/ pt feels it was around 2008 0r 2009   Takotsubo syndrome     Tubular adenoma of colon    Unspecified transient cerebral ischemia    Unspecified vitamin D deficiency    Past Surgical History:  Procedure Laterality Date   ABI  2006   normal   BREAST EXCISIONAL BIOPSY     BREAST LUMPECTOMY WITH RADIOACTIVE SEED LOCALIZATION Right 01/03/2015   Procedure: BREAST LUMPECTOMY WITH RADIOACTIVE SEED LOCALIZATION;  Surgeon: Autumn Messing III, MD;  Location: [place];  Service: General;  Laterality: Right;   CARDIAC DEFIBRILLATOR PLACEMENT  2006; 2012   BSX single chamber ICD   carotid dopplers  2006   neg   CEREBRAL ANEURYSM REPAIR  02/1999   COLONOSCOPY     HAMMER TOE SURGERY  11/15/2020   3rd digit bilateral feet    hospitalization  2004   GI bleed, PUD, diverticulosis (EGD,colonscopy)   hospitalization     PE, NSVT, s/p defib   KNEE ARTHROSCOPY     bilateral   LEFT HEART CATHETERIZATION WITH CORONARY ANGIOGRAM N/A 02/22/2012   Procedure: LEFT HEART CATHETERIZATION WITH CORONARY ANGIOGRAM;  Surgeon: Burnell Blanks, MD;  Location: [facility] CATH LAB;  Service: Cardiovascular;  Laterality: N/A;   PARTIAL HYSTERECTOMY     Fibroids   TONGUE BIOPSY  12/12/2017   due to sore tongue and white patches/abnormal cells   TOTAL KNEE ARTHROPLASTY Left  02/13/2016   Procedure: TOTAL KNEE ARTHROPLASTY;  Surgeon: Vickey Huger, MD;  Location: [place];  Service: Orthopedics;  Laterality: Left;   TOTAL KNEE ARTHROPLASTY Right 05/22/2021   Procedure: TOTAL KNEE ARTHROPLASTY;  Surgeon: Vickey Huger, MD;  Location: [facility];  Service: Orthopedics;  Laterality: Right;  with block   UPPER GASTROINTESTINAL ENDOSCOPY     Patient Active Problem List   Diagnosis Date Noted   Chronic left shoulder pain 11/13/2021   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   Localized swelling of right lower leg 08/29/2021   Tonsillar exudate 08/29/2021   Candida vaginitis 08/28/2021   Chronic pain syndrome 08/25/2021   Multifocal pneumonia 06/03/2021   Pre-operative respiratory examination 05/18/2021    Statin myopathy 04/19/2021   Pain in joint of right shoulder 02/10/2021   Arthritis of both acromioclavicular joints 01/02/2021   Trochanteric bursitis of right hip 10/03/2020   Tailbone injury 05/05/2020   UTI (urinary tract infection) 05/05/2020   Insomnia 05/05/2020   Leg cramps 02/16/2020   Dysuria 02/16/2020   Right hip impingement syndrome 02/01/2020   Right bicipital tenosynovitis 02/01/2020   Bronchiectasis without complication ([place]) 54/56/2563   Myofascial pain 12/21/2019   Estrogen deficiency 12/10/2019   Obesity (BMI 30-39.9) 12/10/2019   Bronchiectasis with (acute) exacerbation ([place]) 10/23/2019   Elevated LFTs 10/15/2019   Lower abdominal pain 09/01/2019   Neck pain 10/25/2017   Paresthesias in left hand 10/25/2017   Paresthesia of foot, bilateral 04/12/2017   Primary osteoarthritis of both hands 09/27/2016   Primary osteoarthritis of both knees 09/27/2016   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   Nasopharyngitis, 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/2016   S/P total knee replacement 02/13/2016   Chronic pain of both knees 12/06/2015   Globus pharyngeus 05/17/2015   Genetic testing 12/23/2014   Family history of breast cancer    Family history of colon cancer    Family history of pancreatic cancer    History of breast cancer 11/23/2014   Allergy to ACE inhibitors 09/27/2014   Prediabetes 06/23/2013   RLS (restless legs syndrome) 01/15/2013   Hx of Clostridium difficile infection 10/10/2012   Dyspnea on exertion 04/02/2012   NICM (nonischemic cardiomyopathy) ([place]) 03/07/2012   Post-menopausal 01/11/2012   Atypical chest pain 11/07/2011   Obstructive sleep apnea 04/04/2010   V-tach ([place]) 01/03/2009   ICD  [employer]  Single chamber 01/03/2009   PFO (patent foramen ovale) 09/29/2008   Vitamin D deficiency 08/17/2008   Hypothyroidism 11/18/2006   Hyperlipidemia 11/18/2006   Depression with anxiety 11/18/2006   Essential hypertension 11/18/2006   Mitral valve prolapse  11/18/2006   Cerebral aneurysm 11/18/2006   Allergic rhinitis 11/18/2006   GERD 11/18/2006   Diverticulosis of colon 11/18/2006   Fatty liver 11/18/2006   Fibromyalgia 11/18/2006    PCP: Durene Fruits, NP  REFERRING PROVIDER: Sherlene Shams, MD  REFERRING DIAG: N32.81 Overactive Bladder  THERAPY DIAG:  No diagnosis found.  Rationale for Evaluation and Treatment Rehabilitation  ONSET DATE: 08/20/2018  SUBJECTIVE:  SUBJECTIVE STATEMENT: Patient always has the urgency to go to the bathroom. When she gets out of the car with strong urgency and does not make it to the bathroom. Patient feels like she has to urinate all the time and another vaginal infection.  Fluid intake: Yes: cranberry juice and some water    Patient confirms identification and approves PT to assess pelvic floor and treatment Yes   PAIN:  Are you having pain? No  PRECAUTIONS: ICD/Pacemaker  WEIGHT BEARING RESTRICTIONS No  FALLS:  Has patient fallen in last 6 months? 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 ;  Sexual abuse: No  BOWEL MOVEMENT Pain with bowel movement: No Type of bowel movement:Type (Bristol Stool Scale) Type 4 and if not taking her Miralax is Type 3, Frequency 1-3 days, and Strain No Fully empty rectum: No, due to feeling like stuff is still in the rectum Fiber supplement: Yes: Miralax  URINATION Pain with urination: No Fully empty bladder: No due to always being in a rush; massage  the lower abdomen so more urine comes out2 Stream:  not as strong as it use to be and in a stream Urgency: Yes: when gets home, night times  and will pee on her leg; she has put a portable toilet by her bed to help Frequency: every 2 hours Leakage: Urge to void, Walking to the bathroom, Coughing, Sneezing, and Bending forward Pads: Yes: 2 poise pads per day   PREGNANCY Vaginal deliveries 4 ( 2 at term and 2 miscarriages) Tearing Yes: one time  PROLAPSE None    OBJECTIVE:   PATIENT SURVEYS:  PFIQ-7 UIQ-7 19  COGNITION:  Overall cognitive status: Within functional limits for tasks assessed       LUMBARAROM/PROM  A/PROM A/PROM  eval  Flexion Tightness in the lumbar region  Extension Decreased by 25%  Right lateral flexion Decreased by 25%  Left lateral flexion Decreased by 25%   (Blank rows = not tested)  LOWER EXTREMITY ROM:  Passive ROM Right eval Left eval  Hip external rotation 40 40   (Blank rows = not tested)  LOWER EXTREMITY MMT:  MMT Right eval Left eval  Hip flexion 4/5 4/5  Hip extension 3/5 3/5  Hip abduction 3/5 3/5  Hip adduction 4/5 4/5  Hip internal rotation 4/5 4/5  Hip external rotation 4/5 4/5   PELVIC MMT:   MMT eval  Vaginal 2/5  (Blank rows = not tested)        PALPATION:   General  decreased mobility of the hysterectomy scar on her abdomen. Umbilical hernia. Difficulty with contracting her lower abdomen.                 External Perineal Exam tenderness located on the right lower pubic bone. Dryness vaginally.                              Internal Pelvic Floor tenderness located on the sides of the bladder. Initial pelvic floor contraction she would push the therapist finger out.   TONE: average  PROLAPSE: Anterior wall came downward slightly  TODAY'S TREATMENT  EVAL finished the eval   PATIENT EDUCATION:  Education details: educated patient on vaginal moisturizers to reduce dryness and irritation; educated patient on  drinking more water instead of cranberry juice Person educated: Patient Education method: handout and education Education comprehension: verbalized understanding   HOME EXERCISE PROGRAM: Drink more water, use vaginal moisturizer daily  ASSESSMENT:  CLINICAL IMPRESSION: Patient is a 71 y.o. female who was seen today for physical therapy evaluation and treatment for overactive bladder. Patient has had an overactive bladder for 3 years. She reports strong urgency and when walking to the bathroom will leak urine. She wears 2 poise pads per day. Patient will leak with urge to void, walking to the bathroom, coughing, sneezing, and bending forward. Pelvic floor strength is 2/5 with initial contraction she would push the therapist finger out and after instruction she was able to just contract the pelvic floor. She had tenderness located on the sides of the bladder.  Patient has some tightness along the sides of the introitus. She has a slight anterior vaginal wall weakness. Vaginal area has some dryness and she reports irritation due to the dryness. Bilateral hip strength averages 4/5. She has decreased bilateral hip external rotation. Patient has decreased mobility of the hysterectomy scar suprapubically. She has difficulty with contracting her lower abdominals. Patient will benefit from skilled therapy to improve continence, pelvic floor coordination and strength.    OBJECTIVE IMPAIRMENTS decreased activity tolerance, decreased coordination, decreased endurance, decreased strength, and increased fascial restrictions.   ACTIVITY LIMITATIONS continence  PARTICIPATION LIMITATIONS: community activity  PERSONAL FACTORS Fitness and 3+ comorbidities: Pacemaker, partial hysterectomy; H/o breast cancer, stroke 2013, Fibromyalgia; CHF  are also affecting patient's functional outcome.   REHAB POTENTIAL: Good  CLINICAL DECISION MAKING: Stable/uncomplicated  EVALUATION COMPLEXITY: Low   GOALS: Goals  reviewed with patient? Yes  SHORT TERM GOALS: Target date: 02/06/2022  Patient educated on vaginal moisturizers to reduce her dryness and irritation to the vaginal tissue Baseline: Goal status: INITIAL  2.  Patient understands the importance of drinking more water to reduce the urge to urinate.  Baseline:  Goal status: INITIAL  3.  Patient able to consistently contract the pelvic floor without bulging the perineum.  Baseline:  Goal status: INITIAL  4.  Patient understands how to mobilize her hysterectomy scar to reduce fascial restrictions.  Baseline:  Goal status: INITIAL   LONG TERM GOALS: Target date: 04/03/2022   Patient is independent with advanced HEP for pelvic floor, hips and core to reduce her leakage.  Baseline:  Goal status: INITIAL  2.  Patient reports her vaginal dryness and irritation decreased >/= 75% due to improve vaginal health Baseline:  Goal status: INITIAL  3.  Patient is able to walk to the bathroom with minimal to no urinary leakage due to improved pelvic floor coordination and strength.  Baseline:  Goal status: INITIAL  4.  Patient reports her urinary leakage with coughing and sneezing decreased >/= 75% due to increased in pelvic floor strength >/= 3/5.  Baseline:  Goal status: INITIAL  5.  Patient reports her urinary urgency decreased >/= 75% due to improved pelvic floor coordination.  Baseline:  Goal status: INITIAL    PLAN: PT FREQUENCY: 1x/week  PT DURATION: 12 weeks  PLANNED INTERVENTIONS: Therapeutic exercises, Therapeutic activity, Neuromuscular re-education, Patient/Family education, Joint mobilization, Dry Needling, Moist heat, and Manual therapy  PLAN FOR NEXT SESSION: manual work to the sides of the bladder and introitus, work on pelvic floor contraction with a lift, urogenital release, scar massage to the hysterectomy scar, lower abdominal bracing  Earlie Counts, PT 01/09/22 3:04 PM   [facility] at  [facility] [address], [place], [place], [address] Phone: [phone]   Fax:  [phone]

## 2022-01-09 NOTE — Patient Instructions (Addendum)
Moisturizers They are used in the vagina to hydrate the mucous membrane that make up the vaginal canal. Designed to keep a more normal acid balance (ph) Once placed in the vagina, it will last between two to three days.  Use 2-3 times per week at bedtime  Ingredients to avoid is glycerin and fragrance, can increase chance of infection Should not be used just before sex due to causing irritation Most are gels administered either in a tampon-shaped applicator or as a vaginal suppository. They are non-hormonal.   Types of Moisturizers(internal use)  Vitamin E vaginal suppositories- Whole foods, Amazon Moist Again Coconut oil- can break down condoms Julva- (Do no use if on Tamoxifen) amazon Yes moisturizer- amazon NeuEve Silk , NeuEve Silver for menopausal or over 65 (if have severe vaginal atrophy or cancer treatments use NeuEve Silk for  1 month than move to [employer])- [employer], [place].com Olive and Bee intimate cream- [email or web address].com.au Mae vaginal moisturizer- Amazon Aloe    Creams to use externally on the Vulva area [employer]'s (good for for cancer patients that had radiation to the area)- [place] or [email or web address] V-magic cream - amazon Julva-amazon Vital "V Wild Yam salve ( help moisturize and help with thinning vulvar area, does have Green Tree by Irwin Brakeman labial moisturizer (Amazon,  Coconut or olive oil Aloe Good Clean love   Things to avoid in the vaginal area Do not use things to irritate the vulvar area No lotions just specialized creams for the vulva area- Neogyn, V-magic, No soaps; can use Aveeno or Calendula cleanser if needed. Must be gentle No deodorants No douches Good to sleep without underwear to let the vaginal area to air out No scrubbing: spread the lips to let warm water rinse over labias and pat dry  Earlie Counts, PT [facility] [address] [place], [place] [address] W: [phone] [email or web address]

## 2022-01-16 ENCOUNTER — Encounter: Payer: Medicare Other | Admitting: Physical Therapy

## 2022-01-19 ENCOUNTER — Encounter: Payer: Self-pay | Admitting: Nurse Practitioner

## 2022-01-19 ENCOUNTER — Ambulatory Visit (INDEPENDENT_AMBULATORY_CARE_PROVIDER_SITE_OTHER): Payer: Medicare Other | Admitting: Nurse Practitioner

## 2022-01-19 VITALS — BP 124/68 | HR 81 | Temp 98.2°F | Ht 66.0 in | Wt 212.6 lb

## 2022-01-19 DIAGNOSIS — R058 Other specified cough: Secondary | ICD-10-CM | POA: Diagnosis not present

## 2022-01-19 DIAGNOSIS — J479 Bronchiectasis, uncomplicated: Secondary | ICD-10-CM

## 2022-01-19 DIAGNOSIS — G4733 Obstructive sleep apnea (adult) (pediatric): Secondary | ICD-10-CM | POA: Diagnosis not present

## 2022-01-19 MED ORDER — BENZONATATE 100 MG PO CAPS: 100.0000 mg | ORAL_CAPSULE | Freq: Three times a day (TID) | ORAL | 1 refills | Status: DC | PRN

## 2022-01-19 MED ORDER — PREDNISONE 20 MG PO TABS: 40.0000 mg | ORAL_TABLET | Freq: Every day | ORAL | 0 refills | Status: AC

## 2022-01-19 MED ORDER — PROMETHAZINE-DM 6.25-15 MG/5ML PO SYRP: 5.0000 mL | ORAL_SOLUTION | Freq: Four times a day (QID) | ORAL | 0 refills | Status: DC | PRN

## 2022-01-19 MED ORDER — FAMOTIDINE 20 MG PO TABS: 20.0000 mg | ORAL_TABLET | Freq: Every day | ORAL | 5 refills | Status: DC

## 2022-01-19 NOTE — Assessment & Plan Note (Signed)
I think her symptoms and exam findings are more consistent with upper airway inflammation than bronchiectatic flare. Regardless, advised she continue mucociliary clearance therapies, Symbicort and PRN albuterol. Breathing is overall stable. Plans for repeat HRCT and PFTs later this year.

## 2022-01-19 NOTE — Progress Notes (Signed)
@Patient  ID: [name], female    DOB: 1951/02/04, 71 y.o.   MRN: [id number]  Chief Complaint  Patient presents with   Follow-up    Pt states that she feels like she has air bubbles in her throat. And as far as the cough she sometimes is unable to get her words out before she starts coughing. Pt states that this has been occurring for about a month. Pt is not taking anything for her cough. Pt states its a dry cough. She would like a mask for the neb machine. Pt is on Albuterol as needed, Symbicort daily and sodium nebs. She states she feels like she is unable to do the inhalers fully.     Referring provider: Camillia Herter, NP  HPI: 71 year old female, former smoker (15 pack years) followed for bronchiectasis, OSA, and chronic respiratory failure with hypoxia.  She is a patient Dr. Juanetta Gosling and last seen in office on 12/05/2021.  Past medical history significant for hypertension, MVP, VT status post AICD, history of PE, PFO, allergic rhinitis, GERD, hypothyroidism, osteoporosis, HLD, depression with anxiety, fibromyalgia, prediabetes, RLS, Takotsubo CM, cerebral aneurysm, breast cancer 2016.  TEST/EVENTS:  11/29/2008 PSG: AHI 9 04/02/2012: ACE 1 04/08/2012 CT chest: Bilateral lower lobe cylindrical BTX and some upper lobes 04/17/2012 PFTs: FEV1 2.6 (110), FEV1% 85, TLC 4.04 (77), DLCO is 44% 10/26/2015 CTA chest: Patchy GGO in the periphery of the lungs bilaterally, basilar BTX, no PE; no significant change 11/10/2015 HST: AHI 7.1, SPO2 low 75% 01/17/2017 PFTs: FEV1 2.38 (111), FEV1% 90, TLC 3.85 (71), DLCO 58%, no BD 05/13/2017 echocardiogram: Mild LVH, EF 55 to 60%, G1 DD, mild TR 05/30/2017: RAST - dust mites; IgA, IgG, IgM normal 10/08/2018 HRCT chest: Atherosclerosis, basilar predominant cylindrical BTX, mild centrilobular/paraseptal emphysema, air trapping 11/14/2018 PFTs: FEV1 2.27 (111), FEV1% 90, TLC 74, DLCO 59% 10/16/2018: HP panel negative,ACE 25 03/24/2019 CTA chest: Chronic lung  changes with peribronchial thickening, bibasilar atelectasis and chronic interstitial disease/peripheral fibrosis of the lung bases 06/03/2021 CTA chest: Scattered GGO with fibrotic changes 08/11/2021 echocardiogram: LVEF 50 to 55%, mild LVH, mildly elevated PA pressure, LA mildly dilated, RA mildly dilated, trivial MVR 08/30/2021 CTA chest: Mild cardiomegaly, unchanged.  Main PA are prominent suggesting PA hypertension.  No PE. BTX noted in the lower lungs, emphysematous changes throughout the lungs.  Fibrotic changes with peripheral prominence.  Not typical of UIP. 08/30/2021 lower extremity venous Doppler: No evidence of DVT  08/29/2021: OV with Kasiya Burck NP.  Increase shortness of breath and cough x 1 week with minimal improvement since onset. Sore throat and hoarseness. Did note RLE edema with mild tenderness. O2 stable. Tx for bronchiectasis flare with prednisone and increased mucinex to 1200 mg Twice daily. Stared hypertonic saline nebs and astelin nasal spray. Tonsillar exudate noted on exam - unable to swab at visit d/t not having kit so advised PCP swab for strep. Wells criteria score with moderate probability for PE. Venous doppler and D dimer ordered - neg for DVT however d dimer elevated so CTA ordered which was negative for PE.   09/12/2021: OV with Zariya Minner NP for follow up after being treated for bronchiectasis flare, likely related to viral URI. Today, she reports feeling much better and that her shortness of breath has resolved except for with long distances, which is baseline for her. She continues to have an occasional cough with a small amount of yellow to clear sputum, which is improved. Her sore throat has also improved but she  does still have some hoarseness. Improved symptoms. Continue mucociliary clearance with mucinex, hypertonic nebs, vest therapy, and flutter valve. Continue Symbicort Twice daily and PRN albuterol. Recent CTA with unchange BTX. She is awaiting an appt with cardiology to  evaluate her atypical chest pains which have been ongoing for months. Her troponins were negative in October and EKG in December without t wave abnormality. The swelling in her RLE is intermittent and somewhat better. She continues on Symbicort Twice daily and has not had to use her rescue inhaler. She has noticed a significant improvement in her rhinitis with the astelin nasal spray. She does report a dry mouth with her CPAP machine, but otherwise has not had any difficulties with it, recommended she use saline nasal gel at bedtime.   12/05/2021: OV with Dr. Halford Chessman. Doing well with improved SOB/DOE. Sleeping well with CPAP. Has not required use of her supplemental O2. ILD changes on CT post covid with improvement on scan from January 2023. Plan to repeat HRCT and PFTs later this year. Continued symbicort, prn albuterol, mucinex flutter valve, and hypertonic saline nebs. Continue singulair, allergra, flonase and astelin. Continue CPAP at night.   01/19/2022: Today - acute Patient presents today for increased cough over the last month. A good friend of her's passed away the beginning of January 28, 2023. They had gathered outside and she started having some allergy type symptoms afterwards. She also had been crying a lot so is unsure which is was related to. These have improved but she developed a persistent cough that is dry and paroxysmal at times. She denies any increased shortness of breath or wheezing, fevers, night sweats. She does feel like she has had some breakthrough GERD symptoms despite taking protonix, mostly in evenings. She wears CPAP at night and continues to receive good benefit from use.   Allergies  Allergen Reactions   Ace Inhibitors Swelling    Angioedema; makes tongue "break out"    Amitriptyline Hcl Other (See Comments)     makes her too sleepy!   Atorvastatin Other (See Comments)    SEVERE MYALGIA   Cymbalta [Duloxetine Hcl] Nausea Only and Other (See Comments)    Sleepiness/ sick   Dilantin  [Phenytoin Sodium Extended] Rash    Severe rash   Paroxetine Nausea Only    Rapid heartbeat   Ramipril Other (See Comments)    TONGUE ULCERS    Rosuvastatin Other (See Comments)    SEVERE MYALGIA   Carbamazepine Rash   Codeine Itching   Paxlovid [Nirmatrelvir-Ritonavir] Itching   Phenytoin Sodium Extended Rash   Simvastatin Other (See Comments)    Increase in CK, myalgias   Wellbutrin [Bupropion] Palpitations    Immunization History  Administered Date(s) Administered   Fluad Quad(high Dose 65+) 04/17/2019, 04/30/2020   H1N1 07/24/2008   Influenza Split 07/17/2011, 09/27/2011, 04/29/2012   Influenza Whole 07/20/2004, 07/04/2007, 05/07/2008, 05/19/2009, 05/17/2010   Influenza, High Dose Seasonal PF 05/30/2017, 05/21/2018   Influenza,inj,Quad PF,6+ Mos 05/20/2013, 05/06/2014, 05/17/2015, 05/15/2016, 04/27/2021   Influenza-Unspecified 05/07/2018   PFIZER(Purple Top)SARS-COV-2 Vaccination 09/25/2019, 10/16/2019, 06/04/2020, 01/04/2021, 08/06/2021   Pneumococcal Conjugate-13 11/22/2015, 08/06/2021   Pneumococcal Polysaccharide-23 05/20/2002, 05/07/2008, 06/25/2014   Td 09/07/2009   Zoster, Live 09/21/2011    Past Medical History:  Diagnosis Date   AICD (automatic cardioverter/defibrillator) present    Anxiety    Arthritis    Back pain    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   Bronchiectasis (HCC)    Cerebral aneurysm,  nonruptured    had a clip put in   CHF (congestive heart failure) (HCC)    Clostridium difficile infection    Depressive disorder, not elsewhere classified    Diverticulosis of colon (without mention of hemorrhage)    Esophageal reflux    Fatty liver    Fibromyalgia    Gastritis    GERD (gastroesophageal reflux disease)    GI bleed 2004   Glaucoma    Hiatal hernia    History of COVID-19 05/04/2021   Hyperlipidemia    Hypertension    Hypothyroidism    Internal hemorrhoids    Joint pain    Multifocal pneumonia 06/03/2021    Obstructive sleep apnea (adult) (pediatric)    Osteoarthritis    Ostium secundum type atrial septal defect    Other chronic nonalcoholic liver disease    Other pulmonary embolism and infarction    Palpitations    Paroxysmal ventricular tachycardia (HCC)    Personal history of radiation therapy    PONV (postoperative nausea and vomiting)    Presence of permanent cardiac pacemaker    PUD (peptic ulcer disease)    Radiation 02/03/15-03/10/15   Right Breast   Sarcoid    per pt , not sure   Schatzki's ring    Shortness of breath    Sleep apnea    Stroke [facility]) 2013   tia/ pt feels it was around 2008 0r 2009   Takotsubo syndrome    Tubular adenoma of colon    Unspecified transient cerebral ischemia    Unspecified vitamin D deficiency     Tobacco History: Social History   Tobacco Use  Smoking Status Former   Packs/day: 1.00   Years: 15.00   Pack years: 15.00   Types: Cigarettes   Quit date: 08/20/1989   Years since quitting: 32.4  Smokeless Tobacco Never  Tobacco Comments   Started at 37; less than 1 PPD   Counseling given: Not Answered Tobacco comments: Started at 63; less than 1 PPD   Outpatient Medications Prior to Visit  Medication Sig Dispense Refill   acetaminophen (TYLENOL) 500 MG tablet Take 500 mg by mouth every 6 (six) hours as needed for moderate pain.     albuterol (PROVENTIL) (2.5 MG/3ML) 0.083% nebulizer solution USE 1 VIAL IN NEBULIZER EVERY 6 HOURS (Patient taking differently: Take 2.5 mg by nebulization every 6 (six) hours as needed for shortness of breath.) 120 mL 11   albuterol (VENTOLIN HFA) 108 (90 Base) MCG/ACT inhaler Inhale 2 puffs into the lungs every 6 (six) hours as needed for wheezing or shortness of breath. 18 g 3   Ascorbic Acid (VITAMIN C) 1000 MG tablet Take 1,000 mg by mouth daily.     aspirin EC 81 MG tablet Take 81 mg by mouth daily. Swallow whole.     azelastine (ASTELIN) 0.1 % nasal spray Place 1 spray into both nostrils 2 (two) times  daily. Use in each nostril as directed 90 mL 3   baclofen (LIORESAL) 10 MG tablet Take 1 tablet (10 mg total) by mouth at bedtime as needed for muscle spasms. 90 tablet 1   budesonide-formoterol (SYMBICORT) 80-4.5 MCG/ACT inhaler INHALE 2 PUFFS TWICE DAILY IN THE MORNING AND AT BEDTIME 3 each 6   cetirizine (ZYRTEC) 10 MG tablet Take 10 mg by mouth daily as needed for allergies.     Cholecalciferol (DIALYVITE VITAMIN D 5000) 125 MCG (5000 UT) capsule Take 5,000 Units by mouth daily.     diclofenac Sodium (VOLTAREN)  1 % GEL Apply 2-4 grams to affected joint 4 times daily as needed. (Patient taking differently: Apply 2-4 g topically 4 (four) times daily as needed (joint pain).) 400 g 2   diltiazem (CARDIZEM CD) 120 MG 24 hr capsule Take 1 capsule (120 mg total) by mouth daily. Do Not start this medication until after you wean off Carvedilol. 90 capsule 1   empagliflozin (JARDIANCE) 10 MG TABS tablet Take 1 tablet (10 mg total) by mouth daily. 90 tablet 3   ezetimibe (ZETIA) 10 MG tablet Take 1 tablet (10 mg total) by mouth daily. 90 tablet 3   fexofenadine (ALLEGRA) 180 MG tablet Take 180 mg by mouth daily as needed for allergies (alternates with zyrtec sometimes).     fluticasone (FLONASE) 50 MCG/ACT nasal spray Place 2 sprays into both nostrils daily as needed for allergies or rhinitis. 48 g 3   furosemide (LASIX) 20 MG tablet Take 40 mg (2 tabs) 3 days per week. Take 20 mg the other days of the week. 90 tablet 3   hydroxypropyl methylcellulose / hypromellose (ISOPTO TEARS / GONIOVISC) 2.5 % ophthalmic solution Place 1 drop into both eyes at bedtime.     latanoprost (XALATAN) 0.005 % ophthalmic solution Place 1 drop into both eyes at bedtime.     levothyroxine (SYNTHROID) 50 MCG tablet Take 1 tablet (50 mcg total) by mouth daily. 90 tablet 1   magnesium oxide (MAG-OX) 400 MG tablet Take 1 tablet (400 mg total) by mouth 2 (two) times daily. 180 tablet 3   methocarbamol (ROBAXIN) 500 MG tablet Take 1-2  tablets (500-1,000 mg total) by mouth every 6 (six) hours as needed for muscle spasms. 60 tablet 0   mometasone (ELOCON) 0.1 % lotion Apply 1 application topically daily as needed (rash). 60 mL 1   montelukast (SINGULAIR) 10 MG tablet TAKE 1 TABLET AT BEDTIME 90 tablet 3   Multiple Vitamins-Minerals (MULTIVITAMIN PO) Take 1 tablet by mouth daily.     nitroGLYCERIN (NITROSTAT) 0.4 MG SL tablet DISSOLVE ONE TABLET UNDER THE TONGUE EVERY 5 MINUTES AS NEEDED FOR CHEST PAIN.  DO NOT EXCEED A TOTAL OF 3 DOSES IN 15 MINUTES (Patient taking differently: Place 0.4 mg under the tongue every 5 (five) minutes as needed for chest pain.) 25 tablet 0   pantoprazole (PROTONIX) 40 MG tablet TAKE 1 TABLET EVERY DAY 90 tablet 0   phenol (CHLORASEPTIC) 1.4 % LIQD Use as directed 1 spray in the mouth or throat 3 (three) times daily as needed for throat irritation / pain. 20 mL 0   polyethylene glycol powder (GLYCOLAX/MIRALAX) powder Take 17 grams by mouth daily 1080 g 0   potassium chloride (KLOR-CON M) 10 MEQ tablet TAKE 1/2 TABLET EVERY DAY (CALL OFFICE TO SCHEDULE YEARLY APPT) 30 tablet 11   Respiratory Therapy Supplies (FLUTTER) DEVI 1 Device by Does not apply route as needed. 1 each 0   sodium chloride HYPERTONIC 3 % nebulizer solution Take by nebulization 2 (two) times daily as needed for cough. 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 750 mL 12   sodium chloride HYPERTONIC 3 % nebulizer solution Take by nebulization in the morning and at bedtime. 750 mL 6   traMADol (ULTRAM) 50 MG tablet Take 1 tablet (50 mg total) by mouth every 12 (twelve) hours as needed. 180 tablet 1   traZODone (DESYREL) 100 MG tablet Take 1-1.5 tablets (100-150 mg total) by mouth at bedtime. 135 tablet 1   amLODipine (NORVASC) 2.5 MG tablet Take 2.5 mg by mouth  daily.     carvedilol (COREG) 12.5 MG tablet Take 0.5 tablets (6.25 mg total) by mouth 2 (two) times daily for 5 days, THEN 0.5 tablets (6.25 mg total) daily for 5 days. Stop at end of taper. 7.5 tablet  0   No facility-administered medications prior to visit.     Review of Systems:   Constitutional: No weight loss or gain, night sweats, fevers, chills, fatigue, or lassitude. HEENT: No headaches, difficulty swallowing, tooth/dental problems, or sore throat. No sneezing, itching, ear ache, nasal congestion, or post nasal drip. CV:  No chest pain, orthopnea, PND, anasarca, dizziness, palpitations, syncope Resp: +shortness of breath with strenuous activity or long distances (baseline); dry, paroxysmal cough. No excess mucus or change in color of mucus. No hemoptysis. No wheezing.  No chest wall deformity GI:  No heartburn, indigestion, abdominal pain, nausea, vomiting, diarrhea, change in bowel habits, loss of appetite, bloody stools.  GU: No dysuria, change in color of urine, urgency or frequency.  No flank pain, no hematuria  Skin: No rash, lesions, ulcerations MSK:  No joint pain or swelling.  No decreased range of motion.  No back pain. Neuro: No dizziness or lightheadedness.  Psych: No depression or anxiety. Mood stable.     Physical Exam:  BP 124/68 (BP Location: Right Arm, Patient Position: Sitting, Cuff Size: Normal)   Pulse 81   Temp 98.2 F (36.8 C) (Oral)   Ht 5' 6"  (1.676 m)   Wt 212 lb 9.6 oz (96.4 kg)   LMP  (LMP Unknown)   SpO2 97%   BMI 34.31 kg/m   GEN: Pleasant, interactive, well-appearing; obese; in no acute distress. HEENT:  Normocephalic and atraumatic. EACs patent bilaterally. TM pearly gray with present light reflex bilaterally. PERRLA. Sclera white. Nasal turbinates pink, moist and patent bilaterally. No rhinorrhea present. Oropharynx erythematous and moist, without exudate or edema. No lesions, ulcerations.  NECK:  Supple w/ fair ROM. No JVD present. Normal carotid impulses w/o bruits. Thyroid symmetrical with no goiter or nodules palpated. No lymphadenopathy.   CV: RRR, no m/r/g, no peripheral edema. Pulses intact, +2 bilaterally. No cyanosis, pallor or  clubbing. PULMONARY:  Unlabored, regular breathing. Clear bilaterally A&P w/o wheezes/rales/rhonchi. No accessory muscle use. No dullness to percussion. GI: BS present and normoactive. Soft, non-tender to palpation. No organomegaly or masses detected. No CVA tenderness. MSK: No erythema, warmth or tenderness. Cap refil <2 sec all extrem. No deformities or joint swelling noted.  Neuro: A/Ox3. No focal deficits noted.   Skin: Warm, no lesions or rashe Psych: Normal affect and behavior. Judgement and thought content appropriate.     Lab Results:  CBC    Component Value Date/Time   WBC 7.4 11/20/2021 1452   WBC 7.1 06/05/2021 0420   RBC 4.52 11/20/2021 1452   RBC 3.29 (L) 06/05/2021 0420   HGB 13.2 11/20/2021 1452   HGB 12.4 12/01/2014 0845   HCT 40.3 11/20/2021 1452   HCT 38.8 12/01/2014 0845   PLT 225 11/20/2021 1452   MCV 89 11/20/2021 1452   MCV 88.5 12/01/2014 0845   MCH 29.2 11/20/2021 1452   MCH 28.9 06/05/2021 0420   MCHC 32.8 11/20/2021 1452   MCHC 30.4 06/05/2021 0420   RDW 12.7 11/20/2021 1452   RDW 12.7 12/01/2014 0845   LYMPHSABS 1.1 11/20/2021 1452   LYMPHSABS 1.0 12/01/2014 0845   MONOABS 0.7 06/03/2021 0927   MONOABS 0.3 12/01/2014 0845   EOSABS 0.2 11/20/2021 1452   EOSABS 0.1 03/13/2007 1249  BASOSABS 0.0 11/20/2021 1452   BASOSABS 0.0 12/01/2014 0845    BMET    Component Value Date/Time   NA 135 08/29/2021 1312   NA 139 08/07/2021 1449   NA 141 12/01/2014 0847   K 3.8 08/29/2021 1312   K 3.8 12/01/2014 0847   CL 98 08/29/2021 1312   CO2 30 08/29/2021 1312   CO2 26 12/01/2014 0847   GLUCOSE 104 (H) 08/29/2021 1312   GLUCOSE 175 (H) 12/01/2014 0847   BUN 19 08/29/2021 1312   BUN 10 08/07/2021 1449   BUN 10.6 12/01/2014 0847   CREATININE 0.89 08/29/2021 1312   CREATININE 0.82 02/27/2017 1518   CREATININE 0.8 12/01/2014 0847   CALCIUM 10.1 08/29/2021 1312   CALCIUM 9.8 12/01/2014 0847   GFRNONAA >60 06/07/2021 0359   GFRNONAA 74 02/27/2017  1518   GFRAA 82 10/01/2019 1408   GFRAA 85 02/27/2017 1518    BNP    Component Value Date/Time   BNP 24.6 06/03/2021 0958   BNP 30.2 08/16/2016 1452     Imaging:  No results found.       Latest Ref Rng & Units 10/16/2018   12:55 PM 01/17/2017    2:50 PM  PFT Results  FVC-Pre L 2.52   2.63    FVC-Predicted Pre % 96   98    FVC-Post L 2.49   2.52    FVC-Predicted Post % 94   94    Pre FEV1/FVC % % 90   90    Post FEV1/FCV % % 91   92    FEV1-Pre L 2.27   2.38    FEV1-Predicted Pre % 111   113    FEV1-Post L 2.26   2.32    DLCO uncorrected ml/min/mmHg 12.43   15.90    DLCO UNC% % 59   58    DLCO corrected ml/min/mmHg  15.80    DLCO COR %Predicted %  57    DLVA Predicted % 85   80    TLC L 4.00   3.85    TLC % Predicted % 74   71    RV % Predicted % 58   55      No results found for: NITRICOXIDE      Assessment & Plan:   Upper airway cough syndrome Suspect her symptoms are related to upper airway irritation/inflammation from worsening allergies and persistent cough. Will treat with prednisone burst. Recommended cough control measures. If no improvement, will obtain imaging but lungs were clear on exam today and she has no other infectious symptoms. Continue astelin and flonase for postnasal drainage control - these symptoms have improved.   Patient Instructions  -Continue Albuterol inhaler 2 puffs or 3 mL neb every 6 hours as needed for shortness of breath or wheezing. Notify if symptoms persist despite rescue inhaler/neb use. -Continue Symbicort 2 puffs Twice daily. Brush tongue and rinse mouth afterwards  -Continue flonase nasal spray 1-2 sprays each nostril daily -Continue allegra 180 mg daily -Continue singulair 10 mg At bedtime  -Continue protonix 40 mg daily  -Continue mucinex 600 mg Twice daily  -Continue flutter valve -Continue nasal irrigations Twice daily  -Continue Astelin nasal spray 1 spray each nostril Twice daily  -Continue supplemental oxygen  at 2 lpm with activity for oxygen saturations >88-90% -Continue hypertonic saline nebs Twice daily until symptoms improve then as needed for cough. Will send through [place] (benzonatate) 1 capsule Three times a day as  needed for cough -Chlorpheniramine 4 mg tab at bedtime at night cough  -Phenergan DM cough syrup 5 mL every 6 hours as needed for cough. May cause drowsiness -Prednisone 40 mg daily for 5 days. Take in AM with food  -Pepcid (famotidine) 20 mg 30 minutes prior to bedtime   Avoid throat clearing as much as possible. Frequent sips of water, especially when you get a tickle in your throat. Suck on sugar free hard candy throughout the day.    Continue to use CPAP every night, minimum of 4-6 hours a night.    Follow up in one month with Dr. Halford Chessman or Roxan Diesel, NP in three months. If symptoms do not improve or worsen, please contact office for sooner follow up or seek emergency care.    Bronchiectasis without complication ([place]) I think her symptoms and exam findings are more consistent with upper airway inflammation than bronchiectatic flare. Regardless, advised she continue mucociliary clearance therapies, Symbicort and PRN albuterol. Breathing is overall stable. Plans for repeat HRCT and PFTs later this year.   Obstructive sleep apnea Excellent compliance and continues to receive good benefit from use.     Clayton Bibles, NP 01/19/2022  Pt aware and understands NP's role.

## 2022-01-19 NOTE — Patient Instructions (Addendum)
-  Continue Albuterol inhaler 2 puffs or 3 mL neb every 6 hours as needed for shortness of breath or wheezing. Notify if symptoms persist despite rescue inhaler/neb use. -Continue Symbicort 2 puffs Twice daily. Brush tongue and rinse mouth afterwards  -Continue flonase nasal spray 1-2 sprays each nostril daily -Continue allegra 180 mg daily -Continue singulair 10 mg At bedtime  -Continue protonix 40 mg daily  -Continue mucinex 600 mg Twice daily  -Continue flutter valve -Continue nasal irrigations Twice daily  -Continue Astelin nasal spray 1 spray each nostril Twice daily  -Continue supplemental oxygen at 2 lpm with activity for oxygen saturations >88-90% -Continue hypertonic saline nebs Twice daily until symptoms improve then as needed for cough. Will send through [place] (benzonatate) 1 capsule Three times a day as needed for cough -Chlorpheniramine 4 mg tab at bedtime at night cough  -Phenergan DM cough syrup 5 mL every 6 hours as needed for cough. May cause drowsiness -Prednisone 40 mg daily for 5 days. Take in AM with food  -Pepcid (famotidine) 20 mg 30 minutes prior to bedtime   Avoid throat clearing as much as possible. Frequent sips of water, especially when you get a tickle in your throat. Suck on sugar free hard candy throughout the day.    Continue to use CPAP every night, minimum of 4-6 hours a night.    Follow up in one month with Dr. Halford Chessman or Roxan Diesel, NP in three months. If symptoms do not improve or worsen, please contact office for sooner follow up or seek emergency care.

## 2022-01-19 NOTE — Assessment & Plan Note (Signed)
Excellent compliance and continues to receive good benefit from use.   

## 2022-01-19 NOTE — Assessment & Plan Note (Signed)
Suspect her symptoms are related to upper airway irritation/inflammation from worsening allergies and persistent cough. Will treat with prednisone burst. Recommended cough control measures. If no improvement, will obtain imaging but lungs were clear on exam today and she has no other infectious symptoms. Continue astelin and flonase for postnasal drainage control - these symptoms have improved.   Patient Instructions  -Continue Albuterol inhaler 2 puffs or 3 mL neb every 6 hours as needed for shortness of breath or wheezing. Notify if symptoms persist despite rescue inhaler/neb use. -Continue Symbicort 2 puffs Twice daily. Brush tongue and rinse mouth afterwards  -Continue flonase nasal spray 1-2 sprays each nostril daily -Continue allegra 180 mg daily -Continue singulair 10 mg At bedtime  -Continue protonix 40 mg daily  -Continue mucinex 600 mg Twice daily  -Continue flutter valve -Continue nasal irrigations Twice daily  -Continue Astelin nasal spray 1 spray each nostril Twice daily  -Continue supplemental oxygen at 2 lpm with activity for oxygen saturations >88-90% -Continue hypertonic saline nebs Twice daily until symptoms improve then as needed for cough. Will send through [facility] (benzonatate) 1 capsule Three times a day as needed for cough -Chlorpheniramine 4 mg tab at bedtime at night cough  -Phenergan DM cough syrup 5 mL every 6 hours as needed for cough. May cause drowsiness -Prednisone 40 mg daily for 5 days. Take in AM with food  -Pepcid (famotidine) 20 mg 30 minutes prior to bedtime   Avoid throat clearing as much as possible. Frequent sips of water, especially when you get a tickle in your throat. Suck on sugar free hard candy throughout the day.    Continue to use CPAP every night, minimum of 4-6 hours a night.    Follow up in one month with Dr. Halford Chessman or Roxan Diesel, NP in three months. If symptoms do not improve or worsen, please contact  office for sooner follow up or seek emergency care.

## 2022-01-22 NOTE — Progress Notes (Signed)
Reviewed and agree with assessment/plan.   Chesley Mires, MD [facility] 01/22/2022, 1:49 PM Pager:  [phone]

## 2022-01-24 ENCOUNTER — Ambulatory Visit (INDEPENDENT_AMBULATORY_CARE_PROVIDER_SITE_OTHER): Payer: Medicare Other | Admitting: Podiatry

## 2022-01-24 ENCOUNTER — Encounter: Payer: Self-pay | Admitting: Podiatry

## 2022-01-24 DIAGNOSIS — L84 Corns and callosities: Secondary | ICD-10-CM

## 2022-01-24 NOTE — Progress Notes (Signed)
Subjective:   Patient ID: [name], female   DOB: 71 y.o.   MRN: [id number]   HPI Patient presents with chronic lesions of both feet with long-term history of skin manifestations   ROS      Objective:  Physical Exam  Neurovascular status intact with lesions that are worse on fifth metatarsal base bilateral submetatarsal region bilateral     Assessment:  Chronic keratotic lesions which are very difficult due to where they are at in her skin type and structure     Plan:  Aggressive debridement done I did have some 2 small areas of blood on each foot I went ahead and applied Band-Aids with small amount of Neosporin and if any redness or any other issue were to occur she is to let us know and we she will be seen back 4 months for routine debridement

## 2022-01-25 ENCOUNTER — Encounter: Payer: Self-pay | Admitting: Physical Therapy

## 2022-01-25 ENCOUNTER — Encounter: Payer: Medicare Other | Attending: Obstetrics and Gynecology | Admitting: Physical Therapy

## 2022-01-25 DIAGNOSIS — M6281 Muscle weakness (generalized): Secondary | ICD-10-CM | POA: Diagnosis present

## 2022-01-25 DIAGNOSIS — R3915 Urgency of urination: Secondary | ICD-10-CM | POA: Diagnosis present

## 2022-01-25 DIAGNOSIS — R278 Other lack of coordination: Secondary | ICD-10-CM | POA: Diagnosis present

## 2022-01-25 NOTE — Therapy (Signed)
OUTPATIENT PHYSICAL THERAPY TREATMENT NOTE   Patient Name: [name] MRN: [id number] DOB:November 29, 1950, 71 y.o., female Today's Date: 01/25/2022  PCP: Durene Fruits, NP REFERRING PROVIDER: Sherlene Shams, MD  END OF SESSION:   PT End of Session - 01/25/22 1306     Visit Number 2    Date for PT Re-Evaluation 04/03/22    Authorization Type Medicare    Authorization - Visit Number 2    Authorization - Number of Visits 10    PT Start Time 1300    PT Stop Time 2248    PT Time Calculation (min) 45 min    Activity Tolerance Patient tolerated treatment well    Behavior During Therapy WFL for tasks assessed/performed             Past Medical History:  Diagnosis Date   AICD (automatic cardioverter/defibrillator) present    Anxiety    Arthritis    Back pain    Breast cancer ([place]) 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   Bronchiectasis ([place])    Cerebral aneurysm, nonruptured    had a clip put in   CHF (congestive heart failure) ([place])    Clostridium difficile infection    Depressive disorder, not elsewhere classified    Diverticulosis of colon (without mention of hemorrhage)    Esophageal reflux    Fatty liver    Fibromyalgia    Gastritis    GERD (gastroesophageal reflux disease)    GI bleed 2004   Glaucoma    Hiatal hernia    History of COVID-19 05/04/2021   Hyperlipidemia    Hypertension    Hypothyroidism    Internal hemorrhoids    Joint pain    Multifocal pneumonia 06/03/2021   Obstructive sleep apnea (adult) (pediatric)    Osteoarthritis    Ostium secundum type atrial septal defect    Other chronic nonalcoholic liver disease    Other pulmonary embolism and infarction    Palpitations    Paroxysmal ventricular tachycardia ([place])    Personal history of radiation therapy    PONV (postoperative nausea and vomiting)    Presence of permanent cardiac pacemaker    PUD (peptic ulcer disease)    Radiation 02/03/15-03/10/15   Right Breast   Sarcoid     per pt , not sure   Schatzki's ring    Shortness of breath    Sleep apnea    Stroke [facility]) 2013   tia/ pt feels it was around 2008 0r 2009   Takotsubo syndrome    Tubular adenoma of colon    Unspecified transient cerebral ischemia    Unspecified vitamin D deficiency    Past Surgical History:  Procedure Laterality Date   ABI  2006   normal   BREAST EXCISIONAL BIOPSY     BREAST LUMPECTOMY WITH RADIOACTIVE SEED LOCALIZATION Right 01/03/2015   Procedure: BREAST LUMPECTOMY WITH RADIOACTIVE SEED LOCALIZATION;  Surgeon: Autumn Messing III, MD;  Location: [place];  Service: General;  Laterality: Right;   CARDIAC DEFIBRILLATOR PLACEMENT  2006; 2012   BSX single chamber ICD   carotid dopplers  2006   neg   CEREBRAL ANEURYSM REPAIR  02/1999   COLONOSCOPY     HAMMER TOE SURGERY  11/15/2020   3rd digit bilateral feet    hospitalization  2004   GI bleed, PUD, diverticulosis (EGD,colonscopy)   hospitalization     PE, NSVT, s/p defib   KNEE ARTHROSCOPY     bilateral   LEFT HEART CATHETERIZATION  WITH CORONARY ANGIOGRAM N/A 02/22/2012   Procedure: LEFT HEART CATHETERIZATION WITH CORONARY ANGIOGRAM;  Surgeon: Burnell Blanks, MD;  Location: [facility] CATH LAB;  Service: Cardiovascular;  Laterality: N/A;   PARTIAL HYSTERECTOMY     Fibroids   TONGUE BIOPSY  12/12/2017   due to sore tongue and white patches/abnormal cells   TOTAL KNEE ARTHROPLASTY Left 02/13/2016   Procedure: TOTAL KNEE ARTHROPLASTY;  Surgeon: Vickey Huger, MD;  Location: [place];  Service: Orthopedics;  Laterality: Left;   TOTAL KNEE ARTHROPLASTY Right 05/22/2021   Procedure: TOTAL KNEE ARTHROPLASTY;  Surgeon: Vickey Huger, MD;  Location: [facility];  Service: Orthopedics;  Laterality: Right;  with block   UPPER GASTROINTESTINAL ENDOSCOPY     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   Localized swelling of right  lower leg 08/29/2021   Tonsillar exudate 08/29/2021   Candida vaginitis 08/28/2021   Chronic pain syndrome 08/25/2021   Multifocal pneumonia 06/03/2021   Pre-operative respiratory examination 05/18/2021   Statin myopathy 04/19/2021   Pain in joint of right shoulder 02/10/2021   Arthritis of both acromioclavicular joints 01/02/2021   Trochanteric bursitis of right hip 10/03/2020   Tailbone injury 05/05/2020   UTI (urinary tract infection) 05/05/2020   Insomnia 05/05/2020   Leg cramps 02/16/2020   Dysuria 02/16/2020   Right hip impingement syndrome 02/01/2020   Right bicipital tenosynovitis 02/01/2020   Bronchiectasis without complication ([place]) 59/56/3875   Myofascial pain 12/21/2019   Estrogen deficiency 12/10/2019   Obesity (BMI 30-39.9) 12/10/2019   Bronchiectasis with (acute) exacerbation ([place]) 10/23/2019   Elevated LFTs 10/15/2019   Lower abdominal pain 09/01/2019   Neck pain 10/25/2017   Paresthesias in left hand 10/25/2017   Paresthesia of foot, bilateral 04/12/2017   Primary osteoarthritis of both hands 09/27/2016   Primary osteoarthritis of both knees 09/27/2016   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   Nasopharyngitis, 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/2016   S/P total knee replacement 02/13/2016   Chronic pain of both knees 12/06/2015   Globus pharyngeus 05/17/2015   Genetic testing 12/23/2014   Family history of breast cancer    Family history of colon cancer    Family history of pancreatic cancer    History of breast cancer 11/23/2014   Allergy to ACE inhibitors 09/27/2014   Prediabetes 06/23/2013   RLS (restless legs syndrome) 01/15/2013   Hx of Clostridium difficile infection 10/10/2012   Dyspnea on exertion 04/02/2012   NICM (nonischemic cardiomyopathy) ([place]) 03/07/2012   Post-menopausal 01/11/2012   Atypical chest pain 11/07/2011   Obstructive sleep apnea 04/04/2010   V-tach ([place]) 01/03/2009   ICD  [employer]  Single chamber 01/03/2009   PFO (patent foramen  ovale) 09/29/2008   Vitamin D deficiency 08/17/2008   Hypothyroidism 11/18/2006   Hyperlipidemia 11/18/2006   Depression with anxiety 11/18/2006   Essential hypertension 11/18/2006   Mitral valve prolapse 11/18/2006   Cerebral aneurysm 11/18/2006   Allergic rhinitis 11/18/2006   GERD 11/18/2006   Diverticulosis of colon 11/18/2006   Fatty liver 11/18/2006   Fibromyalgia 11/18/2006    REFERRING DIAG: N32.81 Overactive Bladder  THERAPY DIAG:  Muscle weakness (generalized)  Other lack of coordination  Urinary urgency  Rationale for Evaluation and Treatment Rehabilitation  PERTINENT HISTORY: Partial hysterectomy; breast cancer 2016; Glaucoma; Hypothyroidism; Fibromyalgia; CHF; Cerebral Aneurysm repair 02/1999; TKR bil.   PRECAUTIONS: ICD/Pacemaker   SUBJECTIVE: The creams help me. Having  a bowel movement dally with miralax. Patient has to wipe multiple times due to the stool being soft. Still feel the stool is in the rectum. I see the GI doctor next week. I am drinking more water and reduces the urge but my pads are wetter due to the urinary leakage.   PAIN:  Are you having pain? No  PATIENT GOALS reducing the urgency and urinary leakage  BOWEL MOVEMENT Pain with bowel movement: No Type of bowel movement:Type (Bristol Stool Scale) Type 4 and if not taking her Miralax is Type 3, Frequency 1-3 days, and Strain No Fully empty rectum: No, due to feeling like stuff is still in the rectum Fiber supplement: Yes: Miralax   URINATION Pain with urination: No Fully empty bladder: No due to always being in a rush; massage the lower abdomen so more urine comes out Stream:  not as strong as it use to be and in a stream Urgency: Yes: when gets home, night times  and will pee on her leg; she has put a portable toilet by her bed to help Frequency: every 2 hours Leakage: Urge to void, Walking to the bathroom, Coughing, Sneezing, and Bending forward Pads: Yes: 2 poise pads per day    OBJECTIVE: (objective measures completed at initial evaluation unless otherwise dated)  PATIENT SURVEYS:  PFIQ-7 UIQ-7 19   COGNITION:            Overall cognitive status: Within functional limits for tasks assessed                              LUMBARAROM/PROM   A/PROM A/PROM  eval  Flexion Tightness in the lumbar region  Extension Decreased by 25%  Right lateral flexion Decreased by 25%  Left lateral flexion Decreased by 25%   (Blank rows = not tested)   LOWER EXTREMITY ROM:   Passive ROM Right eval Left eval  Hip external rotation 40 40   (Blank rows = not tested)   LOWER EXTREMITY MMT:   MMT Right eval Left eval  Hip flexion 4/5 4/5  Hip extension 3/5 3/5  Hip abduction 3/5 3/5  Hip adduction 4/5 4/5  Hip internal rotation 4/5 4/5  Hip external rotation 4/5 4/5    PELVIC MMT:   MMT eval  Vaginal 2/5  (Blank rows = not tested)         PALPATION:   General  decreased mobility of the hysterectomy scar on her abdomen. Umbilical hernia. Difficulty with contracting her lower abdomen.                  External Perineal Exam tenderness located on the right lower pubic bone. Dryness vaginally.                              Internal Pelvic Floor tenderness located on the sides of the bladder. Initial pelvic floor contraction she would push the therapist finger out.    TONE: average   PROLAPSE: Anterior wall came downward slightly   TODAY'S TREATMENT  01/25/2022 Manual: Soft tissue mobilization: Manual work to the lower abdomen to work on tissue release Scar tissue mobilization: to the c-section scar improving mobility and educated patient on how to perform the scar massage at home Exercises: Strengthening: abdominal contraction with ball squeeze with tactile cues and verbal cues Self-care:  Reviewed with patient on vaginal moisturizers  and washes  that are safe for the vaginal area.     PATIENT EDUCATION: Education details:  Access Code: [email or web address],  reviewed vaginal moisturizers and vaginal washes, scar massage Person educated: Patient Education method: Explanation, Demonstration, Tactile cues, Verbal cues, and Handouts Education comprehension: verbalized understanding, returned demonstration, verbal cues required, tactile cues required, and needs further education       HOME EXERCISE PROGRAM: 01/25/2022  Access Code: [email or web address] URL: [email or web address] Date: 01/25/2022 Prepared by: Earlie Counts  Exercises - Hooklying Transversus Abdominis Palpation  - 1 x daily - 7 x weekly - 1 sets - 10 reps ASSESSMENT:   CLINICAL IMPRESSION: Patient is a 71 y.o. female who was seen today for physical therapy evaluation and treatment for overactive bladder. Patient has had an overactive bladder for 3 years. Patient has some difficulty with contraction her lower abdomen and needs tactile and verbal cues. Patient understands how to perform scar massage to her lower abdomen. She understands how to use vaginal moisturizers and reports the vaginal area is feeling better. Patient will benefit from skilled therapy to improve continence, pelvic floor coordination and strength.      OBJECTIVE IMPAIRMENTS decreased activity tolerance, decreased coordination, decreased endurance, decreased strength, and increased fascial restrictions.    ACTIVITY LIMITATIONS continence   PARTICIPATION LIMITATIONS: community activity   PERSONAL FACTORS Fitness and 3+ comorbidities: Pacemaker, partial hysterectomy; H/o breast cancer, stroke 2013, Fibromyalgia; CHF  are also affecting patient's functional outcome.    REHAB POTENTIAL: Good   CLINICAL DECISION MAKING: Stable/uncomplicated   EVALUATION COMPLEXITY: Low     GOALS: Goals reviewed with patient? Yes   SHORT TERM GOALS: Target date: 02/06/2022   Patient educated on vaginal moisturizers to reduce her dryness and irritation to the vaginal tissue Baseline: Goal status: INITIAL   2.  Patient  understands the importance of drinking more water to reduce the urge to urinate.  Baseline:  Goal status: INITIAL   3.  Patient able to consistently contract the pelvic floor without bulging the perineum.  Baseline:  Goal status: INITIAL   4.  Patient understands how to mobilize her hysterectomy scar to reduce fascial restrictions.  Baseline:  Goal status: INITIAL     LONG TERM GOALS: Target date: 04/03/2022    Patient is independent with advanced HEP for pelvic floor, hips and core to reduce her leakage.  Baseline:  Goal status: INITIAL   2.  Patient reports her vaginal dryness and irritation decreased >/= 75% due to improve vaginal health Baseline:  Goal status: INITIAL   3.  Patient is able to walk to the bathroom with minimal to no urinary leakage due to improved pelvic floor coordination and strength.  Baseline:  Goal status: INITIAL   4.  Patient reports her urinary leakage with coughing and sneezing decreased >/= 75% due to increased in pelvic floor strength >/= 3/5.  Baseline:  Goal status: INITIAL   5.  Patient reports her urinary urgency decreased >/= 75% due to improved pelvic floor coordination.  Baseline:  Goal status: INITIAL       PLAN: PT FREQUENCY: 1x/week   PT DURATION: 12 weeks   PLANNED INTERVENTIONS: Therapeutic exercises, Therapeutic activity, Neuromuscular re-education, Patient/Family education, Joint mobilization, Dry Needling, Moist heat, and Manual therapy   PLAN FOR NEXT SESSION: manual work to the sides of the bladder and introitus, work on pelvic floor contraction with a lift, urogenital release, scar massage to the hysterectomy scar, lower abdominal bracing, hip stretches for ER, hip adductors, butterfly stretch, hamstring, knees to  ches     Earlie Counts, PT 01/25/22 1:58 PM

## 2022-01-25 NOTE — Patient Instructions (Signed)
Moisturizers They are used in the vagina to hydrate the mucous membrane that make up the vaginal canal. Designed to keep a more normal acid balance (ph) Once placed in the vagina, it will last between two to three days.  Use 2-3 times per week at bedtime  Ingredients to avoid is glycerin and fragrance, can increase chance of infection Should not be used just before sex due to causing irritation Most are gels administered either in a tampon-shaped applicator or as a vaginal suppository. They are non-hormonal.   Types of Moisturizers(internal use)  Vitamin E vaginal suppositories- Whole foods, Amazon Moist Again Coconut oil- can break down condoms Julva- (Do no use if on Tamoxifen) amazon Yes moisturizer- amazon NeuEve Silk , NeuEve Silver for menopausal or over 65 (if have severe vaginal atrophy or cancer treatments use NeuEve Silk for  1 month than move to [employer])- [employer], [place].com Olive and Bee intimate cream- [email or web address].com.au Mae vaginal moisturizer- Amazon Aloe    Creams to use externally on the Vulva area Alber[employer]on's (good for for cancer patients that had radiation to the area)- [place] or [employer].[email or web address].br V-magic cream - amazon Julva-amazon Vital "V [place] Yam salve ( help moisturize and help with thinning vulvar area, does have [place] by Irwin Brakeman labial moisturizer (Amazon,  Coconut or olive oil aloe   Things to avoid in the vaginal area Do not use things to irritate the vulvar area No lotions just specialized creams for the vulva area- Neogyn, V-magic, No soaps; can use Aveeno or Calendula cleanser if needed. Must be gentle No deodorants No douches Good to sleep without underwear to let the vaginal area to air out No scrubbing: spread the lips to let warm water rinse over labias and pat dry Earlie Counts, PT [facility] [address] [place], [place] [address] W: [phone] [email or web address]

## 2022-01-30 ENCOUNTER — Encounter: Payer: Self-pay | Admitting: Physical Therapy

## 2022-01-30 ENCOUNTER — Ambulatory Visit (INDEPENDENT_AMBULATORY_CARE_PROVIDER_SITE_OTHER): Payer: Medicare Other | Admitting: Internal Medicine

## 2022-01-30 ENCOUNTER — Encounter: Payer: Self-pay | Admitting: Internal Medicine

## 2022-01-30 ENCOUNTER — Encounter: Payer: Medicare Other | Admitting: Physical Therapy

## 2022-01-30 VITALS — BP 116/66 | HR 80 | Ht 65.5 in | Wt 211.5 lb

## 2022-01-30 DIAGNOSIS — R3915 Urgency of urination: Secondary | ICD-10-CM

## 2022-01-30 DIAGNOSIS — K2940 Chronic atrophic gastritis without bleeding: Secondary | ICD-10-CM | POA: Diagnosis not present

## 2022-01-30 DIAGNOSIS — I2510 Atherosclerotic heart disease of native coronary artery without angina pectoris: Secondary | ICD-10-CM

## 2022-01-30 DIAGNOSIS — K219 Gastro-esophageal reflux disease without esophagitis: Secondary | ICD-10-CM

## 2022-01-30 DIAGNOSIS — R278 Other lack of coordination: Secondary | ICD-10-CM

## 2022-01-30 DIAGNOSIS — M6281 Muscle weakness (generalized): Secondary | ICD-10-CM

## 2022-01-30 DIAGNOSIS — K5904 Chronic idiopathic constipation: Secondary | ICD-10-CM | POA: Diagnosis not present

## 2022-01-30 DIAGNOSIS — I2584 Coronary atherosclerosis due to calcified coronary lesion: Secondary | ICD-10-CM | POA: Diagnosis not present

## 2022-01-30 MED ORDER — PANTOPRAZOLE SODIUM 40 MG PO TBEC: 40.0000 mg | DELAYED_RELEASE_TABLET | Freq: Every day | ORAL | 3 refills | Status: DC

## 2022-01-30 MED ORDER — FAMOTIDINE 40 MG PO TABS: 40.0000 mg | ORAL_TABLET | Freq: Every day | ORAL | 6 refills | Status: DC

## 2022-01-30 NOTE — Progress Notes (Signed)
   Subjective:    Patient ID: [name], female    DOB: 18-Apr-1951, 71 y.o.   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 difficile who is here for follow-up.  She is here alone today.  She was last seen 1 year ago.  She also has a history of V. tach with an ICD in place, bronchiectasis, hypertension and sleep apnea.  She reports that since resuming pantoprazole her throat clearing and cough symptoms have improved.  However pulmonology added famotidine in the evening to help with additional throat clearing and cough.  She does periodically wake up at night with coughing but this is definitively better since adding famotidine.  She states that there is possibility that there was some allergic component as certain scents can make her cough as well.  She is not having dysphagia symptom.  She has continued to pantoprazole 40 mg in the morning as well.  Bowel movements are regular occurring typically once per day but can be incomplete or "pasty" with the MiraLAX.  She cut MiraLAX back to once per day because at twice daily she was having some mild fecal smearing.  She has worked with pelvic floor physical therapy on issues with bladder emptying.   Review of Systems As per HPI, otherwise negative  Current Medications, Allergies, Past Medical History, Past Surgical History, Family History and Social History were reviewed in [employer] record.    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 difficile who is here for follow-up.   GERD and LPR/hiatal hernia --pantoprazole helped her GERD and LPR symptoms though she was still having nocturnal LPR symptoms.  She has moderately improved with famotidine though not completely. --GERD diet and lifestyle modification which we discussed --Continue pantoprazole 40 mg 30 minutes before first meal of the day --Increase famotidine to 40 mg near bedtime for additional control of the above symptoms  2.  History of gastritis with metaplasia and no dysplasia --repeat upper endoscopy recommended with next colonoscopy for topographic mapping and surveillance.  3.  Constipation and incomplete evacuation --pelvic floor physical therapy for which she is already receiving for bladder may also help this symptom.  We discussed MiraLAX as an osmotic laxative does not provide a lot of forward force. --Continue MiraLAX 17 g daily --Add Benefiber 2 tablespoons daily to bulk stool and help with complete evacuation and prevent fecal smearing  4.  History of adenomatous colon polyps --consider colonoscopy May 2024 --She prefers to repeat the same prep she used previously which she tolerated very well  30 minutes total spent today including patient facing time, coordination of care, reviewing medical history/procedures/pertinent radiology studies, and documentation of the encounter.

## 2022-01-30 NOTE — Patient Instructions (Signed)
If you are age 73 or older, your body mass index should be between 23-30. Your Body mass index is 34.66 kg/m. If this is out of the aforementioned range listed, please consider follow up with your Primary Care Provider.  If you are age 47 or younger, your body mass index should be between 19-25. Your Body mass index is 34.66 kg/m. If this is out of the aformentioned range listed, please consider follow up with your Primary Care Provider.   ________________________________________________________  The [place] [facility] providers would like to encourage you to use [facility] to communicate with providers for non-urgent requests or questions.  Due to long hold times on the telephone, sending your provider a message by [facility] may be a faster and more efficient way to get a response.  Please allow 48 business hours for a response.  Please remember that this is for non-urgent requests.  _______________________________________________________  We have sent the following medications to your pharmacy for you to pick up at your convenience:  Pantoprazole, Pepcid '40mg'$   Continue Miralax but add 2 tbsp of Benefiber daily

## 2022-01-30 NOTE — Therapy (Signed)
OUTPATIENT PHYSICAL THERAPY TREATMENT NOTE   Patient Name: [name] MRN: [id number] DOB:08-Jul-1951, 71 y.o., female Today's Date: 01/30/2022  PCP: Durene Fruits, NP REFERRING PROVIDER: Sherlene Shams, MD  END OF SESSION:   PT End of Session - 01/30/22 1132     Visit Number 3    Date for PT Re-Evaluation 04/03/22    Authorization Type Medicare    Authorization - Visit Number 3    Authorization - Number of Visits 10    PT Start Time 1130    PT Stop Time 8315    PT Time Calculation (min) 45 min    Activity Tolerance Patient tolerated treatment well    Behavior During Therapy WFL for tasks assessed/performed             Past Medical History:  Diagnosis Date   AICD (automatic cardioverter/defibrillator) present    Anxiety    Arthritis    Back pain    Breast cancer ([place]) 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   Bronchiectasis ([place])    Cerebral aneurysm, nonruptured    had a clip put in   CHF (congestive heart failure) ([place])    Clostridium difficile infection    Depressive disorder, not elsewhere classified    Diverticulosis of colon (without mention of hemorrhage)    Esophageal reflux    Fatty liver    Fibromyalgia    Gastritis    GERD (gastroesophageal reflux disease)    GI bleed 2004   Glaucoma    Hiatal hernia    History of COVID-19 05/04/2021   Hyperlipidemia    Hypertension    Hypothyroidism    Internal hemorrhoids    Joint pain    Multifocal pneumonia 06/03/2021   Obstructive sleep apnea (adult) (pediatric)    Osteoarthritis    Ostium secundum type atrial septal defect    Other chronic nonalcoholic liver disease    Other pulmonary embolism and infarction    Palpitations    Paroxysmal ventricular tachycardia ([place])    Personal history of radiation therapy    PONV (postoperative nausea and vomiting)    Presence of permanent cardiac pacemaker    PUD (peptic ulcer disease)    Radiation 02/03/15-03/10/15   Right Breast   Sarcoid     per pt , not sure   Schatzki's ring    Shortness of breath    Sleep apnea    Stroke [facility]) 2013   tia/ pt feels it was around 2008 0r 2009   Takotsubo syndrome    Tubular adenoma of colon    Unspecified transient cerebral ischemia    Unspecified vitamin D deficiency    Past Surgical History:  Procedure Laterality Date   ABI  2006   normal   BREAST EXCISIONAL BIOPSY     BREAST LUMPECTOMY WITH RADIOACTIVE SEED LOCALIZATION Right 01/03/2015   Procedure: BREAST LUMPECTOMY WITH RADIOACTIVE SEED LOCALIZATION;  Surgeon: Autumn Messing III, MD;  Location: [place];  Service: General;  Laterality: Right;   CARDIAC DEFIBRILLATOR PLACEMENT  2006; 2012   BSX single chamber ICD   carotid dopplers  2006   neg   CEREBRAL ANEURYSM REPAIR  02/1999   COLONOSCOPY     HAMMER TOE SURGERY  11/15/2020   3rd digit bilateral feet    hospitalization  2004   GI bleed, PUD, diverticulosis (EGD,colonscopy)   hospitalization     PE, NSVT, s/p defib   KNEE ARTHROSCOPY     bilateral   LEFT HEART CATHETERIZATION  WITH CORONARY ANGIOGRAM N/A 02/22/2012   Procedure: LEFT HEART CATHETERIZATION WITH CORONARY ANGIOGRAM;  Surgeon: Burnell Blanks, MD;  Location: [facility] CATH LAB;  Service: Cardiovascular;  Laterality: N/A;   PARTIAL HYSTERECTOMY     Fibroids   TONGUE BIOPSY  12/12/2017   due to sore tongue and white patches/abnormal cells   TOTAL KNEE ARTHROPLASTY Left 02/13/2016   Procedure: TOTAL KNEE ARTHROPLASTY;  Surgeon: Vickey Huger, MD;  Location: [place];  Service: Orthopedics;  Laterality: Left;   TOTAL KNEE ARTHROPLASTY Right 05/22/2021   Procedure: TOTAL KNEE ARTHROPLASTY;  Surgeon: Vickey Huger, MD;  Location: [facility];  Service: Orthopedics;  Laterality: Right;  with block   UPPER GASTROINTESTINAL ENDOSCOPY     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   Localized swelling of right  lower leg 08/29/2021   Tonsillar exudate 08/29/2021   Candida vaginitis 08/28/2021   Chronic pain syndrome 08/25/2021   Multifocal pneumonia 06/03/2021   Pre-operative respiratory examination 05/18/2021   Statin myopathy 04/19/2021   Pain in joint of right shoulder 02/10/2021   Arthritis of both acromioclavicular joints 01/02/2021   Trochanteric bursitis of right hip 10/03/2020   Tailbone injury 05/05/2020   UTI (urinary tract infection) 05/05/2020   Insomnia 05/05/2020   Leg cramps 02/16/2020   Dysuria 02/16/2020   Right hip impingement syndrome 02/01/2020   Right bicipital tenosynovitis 02/01/2020   Bronchiectasis without complication ([place]) 80/99/8338   Myofascial pain 12/21/2019   Estrogen deficiency 12/10/2019   Obesity (BMI 30-39.9) 12/10/2019   Bronchiectasis with (acute) exacerbation ([place]) 10/23/2019   Elevated LFTs 10/15/2019   Lower abdominal pain 09/01/2019   Neck pain 10/25/2017   Paresthesias in left hand 10/25/2017   Paresthesia of foot, bilateral 04/12/2017   Primary osteoarthritis of both hands 09/27/2016   Primary osteoarthritis of both knees 09/27/2016   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   Nasopharyngitis, 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/2016   S/P total knee replacement 02/13/2016   Chronic pain of both knees 12/06/2015   Globus pharyngeus 05/17/2015   Genetic testing 12/23/2014   Family history of breast cancer    Family history of colon cancer    Family history of pancreatic cancer    History of breast cancer 11/23/2014   Allergy to ACE inhibitors 09/27/2014   Prediabetes 06/23/2013   RLS (restless legs syndrome) 01/15/2013   Hx of Clostridium difficile infection 10/10/2012   Dyspnea on exertion 04/02/2012   NICM (nonischemic cardiomyopathy) ([place]) 03/07/2012   Post-menopausal 01/11/2012   Atypical chest pain 11/07/2011   Obstructive sleep apnea 04/04/2010   V-tach ([place]) 01/03/2009   ICD  [employer]  Single chamber 01/03/2009   PFO (patent foramen  ovale) 09/29/2008   Vitamin D deficiency 08/17/2008   Hypothyroidism 11/18/2006   Hyperlipidemia 11/18/2006   Depression with anxiety 11/18/2006   Essential hypertension 11/18/2006   Mitral valve prolapse 11/18/2006   Cerebral aneurysm 11/18/2006   Allergic rhinitis 11/18/2006   GERD 11/18/2006   Diverticulosis of colon 11/18/2006   Fatty liver 11/18/2006   Fibromyalgia 11/18/2006    REFERRING DIAG:  N32.81 Overactive Bladder  THERAPY DIAG:  Muscle weakness (generalized)  Other lack of coordination  Urinary urgency  Rationale for Evaluation and Treatment Rehabilitation  PERTINENT HISTORY: Partial hysterectomy; breast cancer 2016; Glaucoma; Hypothyroidism; Fibromyalgia; CHF; Cerebral Aneurysm repair 02/1999; TKR bil.   PRECAUTIONS:  ICD/Pacemaker   SUBJECTIVE: I am drinking  more so more leakage. I have less urgency.   PAIN:  Are you having pain? No   OBJECTIVE: (objective measures completed at initial evaluation unless otherwise dated)  PATIENT GOALS reducing the urgency and urinary leakage   BOWEL MOVEMENT Pain with bowel movement: No Type of bowel movement:Type (Bristol Stool Scale) Type 4 and if not taking her Miralax is Type 3, Frequency 1-3 days, and Strain No Fully empty rectum: No, due to feeling like stuff is still in the rectum Fiber supplement: Yes: Miralax   URINATION Pain with urination: No Fully empty bladder: No due to always being in a rush; massage the lower abdomen so more urine comes out Stream:  not as strong as it use to be and in a stream Urgency: Yes: when gets home, night times  and will pee on her leg; she has put a portable toilet by her bed to help Frequency: every 2 hours Leakage: Urge to void, Walking to the bathroom, Coughing, Sneezing, and Bending forward Pads: Yes: 2 poise pads per day PATIENT SURVEYS:  PFIQ-7 UIQ-7 19   COGNITION:            Overall cognitive status: Within functional limits for tasks assessed                               LUMBARAROM/PROM   A/PROM A/PROM  eval  Flexion Tightness in the lumbar region  Extension Decreased by 25%  Right lateral flexion Decreased by 25%  Left lateral flexion Decreased by 25%   (Blank rows = not tested)   LOWER EXTREMITY ROM:   Passive ROM Right eval Left eval  Hip external rotation 40 40   (Blank rows = not tested)   LOWER EXTREMITY MMT:   MMT Right eval Left eval  Hip flexion 4/5 4/5  Hip extension 3/5 3/5  Hip abduction 3/5 3/5  Hip adduction 4/5 4/5  Hip internal rotation 4/5 4/5  Hip external rotation 4/5 4/5    PELVIC MMT:   MMT eval  Vaginal 2/5  (Blank rows = not tested)         PALPATION:   General  decreased mobility of the hysterectomy scar on her abdomen. Umbilical hernia. Difficulty with contracting her lower abdomen.                  External Perineal Exam tenderness located on the right lower pubic bone. Dryness vaginally. ; 01/30/2022 examined the genitalia and saw just above the clitorus an are that is split open and where her pain is coming from, she has itching where ithe perineal body is.                              Internal Pelvic Floor tenderness located on the sides of the bladder. Initial pelvic floor contraction she would push the therapist finger out.    TONE: average   PROLAPSE: Anterior wall came downward slightly   TODAY'S TREATMENT  01/30/2022 Manual: Internal pelvic floor techniques: urogenital diaphragm releas through the different restrictions No emotional/communication barriers or cognitive limitation. Patient is motivated to learn. Patient understands and agrees with treatment goals and plan. PT explains patient will be examined in standing, sitting, and lying down to see how their muscles and joints work. When they are ready, they will be asked to remove their underwear so PT can examine their perineum. The patient  is also given the option of providing their own chaperone as one is not provided in our  facility. The patient also has the right and is explained the right to defer or refuse any part of the evaluation or treatment including the internal exam. With the patient's consent, PT will use one gloved finger to gently assess the muscles of the pelvic floor, seeing how well it contracts and relaxes and if there is muscle symmetry. After, the patient will get dressed and PT and patient will discuss exam findings and plan of care. PT and patient discuss plan of care, schedule, attendance policy and HEP activities. : Neuromuscular re-education: Pelvic floor contraction training:pelvic floor contraction with therapist placing her hands on the urogenital diaphragm to assist the pelvic floor to contract and lift.   Exercises: Stretches/mobility:sitting hamstring stretch hold 30 sec bil.     Piriformis stretch in sitting holding for 30 sec bil.     Happy baby stretch    Standing lumbar extension  Self-care: Discussed with patient the side effects of Jardiance and it is yeast infections. She will contact Dr. Wannetta Sender. She is using the V-magic cream but still has itching vaginally.  Educated patient on seeing    01/25/2022 Manual: Soft tissue mobilization: Manual work to the lower abdomen to work on tissue release Scar tissue mobilization: to the c-section scar improving mobility and educated patient on how to perform the scar massage at home Exercises: Strengthening: abdominal contraction with ball squeeze with tactile cues and verbal cues Self-care:  Reviewed with patient on vaginal moisturizers  and washes that are safe for the vaginal area.      PATIENT EDUCATION: Education details:  Access Code: [email or web address], Person educated: Patient Education method: Explanation, Demonstration, Tactile cues, Verbal cues, and Handouts Education comprehension: verbalized understanding, returned demonstration, verbal cues required, tactile cues required, and needs further education       HOME EXERCISE  PROGRAM: 01/30/2022 Access Code: [email or web address] URL: [email or web address] Date: 01/30/2022 Prepared by: Earlie Counts  Exercises - Hooklying Transversus Abdominis Palpation  - 1 x daily - 7 x weekly - 1 sets - 10 reps - Seated Hamstring Stretch  - 1 x daily - 7 x weekly - 1 sets - 2 reps - 30 sec hold - Seated Piriformis Stretch  - 1 x daily - 7 x weekly - 1 sets - 2 reps - 30 sec hold - Seated Happy Baby With Trunk Flexion For Pelvic Relaxation  - 1 x daily - 7 x weekly - 1 sets - 2 reps - 30 sec hold - Standing Lumbar Extension  - 1 x daily - 7 x weekly - 1 sets - 5 reps - Seated Pelvic Floor Contraction  - 3 x daily - 7 x weekly - 3 sets - 5 reps - 5 sec hold   ASSESSMENT:   CLINICAL IMPRESSION: Patient is a 71 y.o. female who was seen today for physical therapy  treatment for overactive bladder. Patient reports less urgency but continues to have the leakage. Patient has a area above the clitoris  that is open and sore. Patient has learned exercises she can do in sitting so she can do them at work. Patient was able to contract the pelvic floor and lift the muscles upward for the first time. Patient feels her urgency is less. Patient will benefit from skilled therapy to improve continence, pelvic floor coordination and strength.      OBJECTIVE IMPAIRMENTS decreased activity tolerance, decreased coordination, decreased endurance, decreased strength, and  increased fascial restrictions.    ACTIVITY LIMITATIONS continence   PARTICIPATION LIMITATIONS: community activity   PERSONAL FACTORS Fitness and 3+ comorbidities: Pacemaker, partial hysterectomy; H/o breast cancer, stroke 2013, Fibromyalgia; CHF  are also affecting patient's functional outcome.    REHAB POTENTIAL: Good   CLINICAL DECISION MAKING: Stable/uncomplicated   EVALUATION COMPLEXITY: Low     GOALS: Goals reviewed with patient? Yes   SHORT TERM GOALS: Target date: 02/06/2022   Patient educated on vaginal  moisturizers to reduce her dryness and irritation to the vaginal tissue Baseline: Goal status: Met 01/30/2022  2.  Patient understands the importance of drinking more water to reduce the urge to urinate.  Baseline:  Goal status: Met 01/30/2022   3.  Patient able to consistently contract the pelvic floor without bulging the perineum.  Baseline:  Goal status: met 01/30/2022   4.  Patient understands how to mobilize her hysterectomy scar to reduce fascial restrictions.  Baseline:  Goal status: INITIAL     LONG TERM GOALS: Target date: 04/03/2022    Patient is independent with advanced HEP for pelvic floor, hips and core to reduce her leakage.  Baseline:  Goal status: INITIAL   2.  Patient reports her vaginal dryness and irritation decreased >/= 75% due to improve vaginal health Baseline:  Goal status: INITIAL   3.  Patient is able to walk to the bathroom with minimal to no urinary leakage due to improved pelvic floor coordination and strength.  Baseline:  Goal status: INITIAL   4.  Patient reports her urinary leakage with coughing and sneezing decreased >/= 75% due to increased in pelvic floor strength >/= 3/5.  Baseline:  Goal status: INITIAL   5.  Patient reports her urinary urgency decreased >/= 75% due to improved pelvic floor coordination.  Baseline:  Goal status: INITIAL       PLAN: PT FREQUENCY: 1x/week   PT DURATION: 12 weeks   PLANNED INTERVENTIONS: Therapeutic exercises, Therapeutic activity, Neuromuscular re-education, Patient/Family education, Joint mobilization, Dry Needling, Moist heat, and Manual therapy   PLAN FOR NEXT SESSION: manual work to the sides of the bladder and introitus, see if she contacted the doctor about her itching, work on pelvic floor contraction with a lift, urogenital release, scar massage to the hysterectomy scar, lower abdominal bracing,   Earlie Counts, PT 01/30/22 11:35 AM'

## 2022-02-06 ENCOUNTER — Ambulatory Visit (INDEPENDENT_AMBULATORY_CARE_PROVIDER_SITE_OTHER): Payer: Medicare Other | Admitting: Rheumatology

## 2022-02-06 ENCOUNTER — Encounter: Payer: Self-pay | Admitting: Rheumatology

## 2022-02-06 VITALS — BP 135/78 | HR 87 | Resp 16 | Ht 66.0 in | Wt 212.0 lb

## 2022-02-06 DIAGNOSIS — M722 Plantar fascial fibromatosis: Secondary | ICD-10-CM

## 2022-02-06 DIAGNOSIS — Z8659 Personal history of other mental and behavioral disorders: Secondary | ICD-10-CM

## 2022-02-06 DIAGNOSIS — Z96653 Presence of artificial knee joint, bilateral: Secondary | ICD-10-CM | POA: Diagnosis not present

## 2022-02-06 DIAGNOSIS — M797 Fibromyalgia: Secondary | ICD-10-CM

## 2022-02-06 DIAGNOSIS — M7061 Trochanteric bursitis, right hip: Secondary | ICD-10-CM | POA: Diagnosis not present

## 2022-02-06 DIAGNOSIS — Z853 Personal history of malignant neoplasm of breast: Secondary | ICD-10-CM

## 2022-02-06 DIAGNOSIS — Z8619 Personal history of other infectious and parasitic diseases: Secondary | ICD-10-CM

## 2022-02-06 DIAGNOSIS — I2510 Atherosclerotic heart disease of native coronary artery without angina pectoris: Secondary | ICD-10-CM

## 2022-02-06 DIAGNOSIS — M19041 Primary osteoarthritis, right hand: Secondary | ICD-10-CM | POA: Diagnosis not present

## 2022-02-06 DIAGNOSIS — Z8669 Personal history of other diseases of the nervous system and sense organs: Secondary | ICD-10-CM

## 2022-02-06 DIAGNOSIS — G2581 Restless legs syndrome: Secondary | ICD-10-CM

## 2022-02-06 DIAGNOSIS — Z8679 Personal history of other diseases of the circulatory system: Secondary | ICD-10-CM

## 2022-02-06 DIAGNOSIS — M25512 Pain in left shoulder: Secondary | ICD-10-CM

## 2022-02-06 DIAGNOSIS — M62838 Other muscle spasm: Secondary | ICD-10-CM

## 2022-02-06 DIAGNOSIS — Z8719 Personal history of other diseases of the digestive system: Secondary | ICD-10-CM

## 2022-02-06 DIAGNOSIS — Z8673 Personal history of transient ischemic attack (TIA), and cerebral infarction without residual deficits: Secondary | ICD-10-CM

## 2022-02-06 DIAGNOSIS — M5030 Other cervical disc degeneration, unspecified cervical region: Secondary | ICD-10-CM

## 2022-02-06 DIAGNOSIS — I671 Cerebral aneurysm, nonruptured: Secondary | ICD-10-CM

## 2022-02-06 DIAGNOSIS — Z8639 Personal history of other endocrine, nutritional and metabolic disease: Secondary | ICD-10-CM

## 2022-02-06 DIAGNOSIS — I2584 Coronary atherosclerosis due to calcified coronary lesion: Secondary | ICD-10-CM

## 2022-02-06 DIAGNOSIS — M19042 Primary osteoarthritis, left hand: Secondary | ICD-10-CM

## 2022-02-06 DIAGNOSIS — G8929 Other chronic pain: Secondary | ICD-10-CM

## 2022-02-06 NOTE — Progress Notes (Signed)
Office Visit Note  Patient: [name]             Date of Birth: 1951-04-15           MRN: [id number]             PCP: Camillia Herter, NP Referring: Camillia Herter, NP Visit Date: 02/06/2022 Occupation: '@GUAROCC'$ @  Subjective:  Increased pain  History of Present Illness: [name] is a 71 y.o. female with history of osteoarthritis, degenerative disc disease and fibromyalgia syndrome.  She continues to have pain and discomfort in her neck and trapezius region.  She also has generalized pain from fibromyalgia.  She has pain and discomfort in her knee joints which have been replaced, bilateral hands and right trochanteric bursa.  Her right Planter fasciitis is still bothersome.  She has been going to pain management.  She gets trigger point injections and also muscle relaxers and pain management medications.   Activities of Daily Living:  Patient reports morning stiffness for 10 minutes.   Patient Reports nocturnal pain.  Difficulty dressing/grooming: Denies Difficulty climbing stairs: Denies Difficulty getting out of chair: Denies Difficulty using hands for taps, buttons, cutlery, and/or writing: Denies  Review of Systems  Constitutional:  Positive for fatigue.  HENT:  Negative for mouth sores and mouth dryness.   Eyes:  Negative for dryness.  Respiratory:  Negative for shortness of breath.   Cardiovascular:  Positive for palpitations. Negative for chest pain.  Gastrointestinal:  Negative for blood in stool, constipation and diarrhea.  Endocrine: Negative for increased urination.  Genitourinary:  Negative for difficulty urinating and involuntary urination.  Musculoskeletal:  Positive for joint pain, joint pain, myalgias, morning stiffness and myalgias. Negative for muscle tenderness.  Skin:  Negative for color change, rash and sensitivity to sunlight.  Allergic/Immunologic: Negative for susceptible to infections.  Neurological:  Negative for dizziness and  headaches.  Hematological:  Negative for swollen glands.  Psychiatric/Behavioral:  Negative for depressed mood and sleep disturbance. The patient is not nervous/anxious.     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   Localized swelling of right lower leg 08/29/2021   Tonsillar exudate 08/29/2021   Candida vaginitis 08/28/2021   Chronic pain syndrome 08/25/2021   Multifocal pneumonia 06/03/2021   Pre-operative respiratory examination 05/18/2021   Statin myopathy 04/19/2021   Pain in joint of right shoulder 02/10/2021   Arthritis of both acromioclavicular joints 01/02/2021   Trochanteric bursitis of right hip 10/03/2020   Tailbone injury 05/05/2020   UTI (urinary tract infection) 05/05/2020   Insomnia 05/05/2020   Leg cramps 02/16/2020   Dysuria 02/16/2020   Right hip impingement syndrome 02/01/2020   Right bicipital tenosynovitis 02/01/2020   Bronchiectasis without complication ([place]) 67/61/9509   Myofascial pain 12/21/2019   Estrogen deficiency 12/10/2019   Obesity (BMI 30-39.9) 12/10/2019   Bronchiectasis with (acute) exacerbation ([place]) 10/23/2019   Elevated LFTs 10/15/2019   Lower abdominal pain 09/01/2019   Neck pain 10/25/2017   Paresthesias in left hand 10/25/2017   Paresthesia of foot, bilateral 04/12/2017   Primary osteoarthritis of both hands 09/27/2016   Primary osteoarthritis of both knees 09/27/2016   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   Nasopharyngitis, 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/2016   S/P total knee replacement 02/13/2016   Chronic pain of both knees 12/06/2015   Globus pharyngeus 05/17/2015   Genetic  testing 12/23/2014   Family history of breast cancer    Family history of colon cancer    Family history of pancreatic cancer    History of breast cancer 11/23/2014   Allergy to ACE inhibitors 09/27/2014   Prediabetes  06/23/2013   RLS (restless legs syndrome) 01/15/2013   Hx of Clostridium difficile infection 10/10/2012   Dyspnea on exertion 04/02/2012   NICM (nonischemic cardiomyopathy) ([place]) 03/07/2012   Post-menopausal 01/11/2012   Atypical chest pain 11/07/2011   Obstructive sleep apnea 04/04/2010   V-tach ([place]) 01/03/2009   ICD  [employer]  Single chamber 01/03/2009   PFO (patent foramen ovale) 09/29/2008   Vitamin D deficiency 08/17/2008   Hypothyroidism 11/18/2006   Hyperlipidemia 11/18/2006   Depression with anxiety 11/18/2006   Essential hypertension 11/18/2006   Mitral valve prolapse 11/18/2006   Cerebral aneurysm 11/18/2006   Allergic rhinitis 11/18/2006   GERD 11/18/2006   Diverticulosis of colon 11/18/2006   Fatty liver 11/18/2006   Fibromyalgia 11/18/2006    Past Medical History:  Diagnosis Date   AICD (automatic cardioverter/defibrillator) present    Anxiety    Arthritis    Back pain    Breast cancer ([place]) 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   Bronchiectasis ([place])    Cerebral aneurysm, nonruptured    had a clip put in   CHF (congestive heart failure) ([place])    Clostridium difficile infection    Depressive disorder, not elsewhere classified    Diverticulosis of colon (without mention of hemorrhage)    Esophageal reflux    Fatty liver    Fibromyalgia    Gastritis    GERD (gastroesophageal reflux disease)    GI bleed 2004   Glaucoma    Hiatal hernia    History of COVID-19 05/04/2021   Hyperlipidemia    Hypertension    Hypothyroidism    Internal hemorrhoids    Joint pain    Multifocal pneumonia 06/03/2021   Obstructive sleep apnea (adult) (pediatric)    Osteoarthritis    Ostium secundum type atrial septal defect    Other chronic nonalcoholic liver disease    Other pulmonary embolism and infarction    Palpitations    Paroxysmal ventricular tachycardia (HCC)    Personal history of radiation therapy    PONV (postoperative nausea and vomiting)     Presence of permanent cardiac pacemaker    PUD (peptic ulcer disease)    Radiation 02/03/15-03/10/15   Right Breast   Sarcoid    per pt , not sure   Schatzki's ring    Shortness of breath    Sleep apnea    Stroke ([place]) 2013   tia/ pt feels it was around 2008 0r 2009   Takotsubo syndrome    Tubular adenoma of colon    Unspecified transient cerebral ischemia    Unspecified vitamin D deficiency     Family History  Problem Relation Age of Onset   Diabetes Mother    Alzheimer's disease Mother    Hypertension Mother    Obesity Mother    Other Brother        Thyroid problem 10/2016   Allergies Sister    Heart disease Sister    Heart disease Brother    Prostate cancer Brother 39       same brother as throat cancer   Throat cancer Brother        dx in his 42s; also a smoker   Heart disease Father  Cancer Maternal Grandmother 47       colon cancer or abdominal cancer   Lung cancer Maternal Grandfather 78   Breast cancer Maternal Aunt 55   Colon cancer Maternal Aunt 61       same sister as breast at 34   Lung cancer Maternal Uncle    Breast cancer Maternal Aunt        dx in her 3s   Pancreatic cancer Cousin 60       maternal first cousin   Breast cancer Cousin        paternal first cousin twice removed died in her 27s   Heart disease Son        Cardiac Arrest 07/2016   Stroke Neg Hx    Past Surgical History:  Procedure Laterality Date   ABI  2006   normal   BREAST EXCISIONAL BIOPSY     BREAST LUMPECTOMY WITH RADIOACTIVE SEED LOCALIZATION Right 01/03/2015   Procedure: BREAST LUMPECTOMY WITH RADIOACTIVE SEED LOCALIZATION;  Surgeon: Autumn Messing III, MD;  Location: [facility];  Service: General;  Laterality: Right;   CARDIAC DEFIBRILLATOR PLACEMENT  2006; 2012   BSX single chamber ICD   carotid dopplers  2006   neg   CEREBRAL ANEURYSM REPAIR  02/1999   COLONOSCOPY     HAMMER TOE SURGERY  11/15/2020   3rd digit bilateral feet    hospitalization  2004   GI bleed, PUD,  diverticulosis (EGD,colonscopy)   hospitalization     PE, NSVT, s/p defib   KNEE ARTHROSCOPY     bilateral   LEFT HEART CATHETERIZATION WITH CORONARY ANGIOGRAM N/A 02/22/2012   Procedure: LEFT HEART CATHETERIZATION WITH CORONARY ANGIOGRAM;  Surgeon: Burnell Blanks, MD;  Location: [facility] CATH LAB;  Service: Cardiovascular;  Laterality: N/A;   PARTIAL HYSTERECTOMY     Fibroids   TONGUE BIOPSY  12/12/2017   due to sore tongue and white patches/abnormal cells   TOTAL KNEE ARTHROPLASTY Left 02/13/2016   Procedure: TOTAL KNEE ARTHROPLASTY;  Surgeon: Vickey Huger, MD;  Location: [place];  Service: Orthopedics;  Laterality: Left;   TOTAL KNEE ARTHROPLASTY Right 05/22/2021   Procedure: TOTAL KNEE ARTHROPLASTY;  Surgeon: Vickey Huger, MD;  Location: [facility];  Service: Orthopedics;  Laterality: Right;  with block   UPPER GASTROINTESTINAL ENDOSCOPY     Social History   Social History Narrative   Lives alone   Caffeine XNA:TFTD   Retired from [employer]   2 children -- 5 grandbabies   Live alone   Immunization History  Administered Date(s) Administered   Fluad Quad(high Dose 65+) 04/17/2019, 04/30/2020   H1N1 07/24/2008   Influenza Split 07/17/2011, 09/27/2011, 04/29/2012   Influenza Whole 07/20/2004, 07/04/2007, 05/07/2008, 05/19/2009, 05/17/2010   Influenza, High Dose Seasonal PF 05/30/2017, 05/21/2018   Influenza,inj,Quad PF,6+ Mos 05/20/2013, 05/06/2014, 05/17/2015, 05/15/2016, 04/27/2021   Influenza-Unspecified 05/07/2018   PFIZER(Purple Top)SARS-COV-2 Vaccination 09/25/2019, 10/16/2019, 06/04/2020, 01/04/2021, 08/06/2021   Pneumococcal Conjugate-13 11/22/2015, 08/06/2021   Pneumococcal Polysaccharide-23 05/20/2002, 05/07/2008, 06/25/2014   Td 09/07/2009   Zoster, Live 09/21/2011     Objective: Vital Signs: BP 135/78 (BP Location: Left Arm, Patient Position: Sitting, Cuff Size: Normal)   Pulse 87   Resp 16   Ht '5\' 6"'$  (1.676 m)   Wt 212 lb (96.2 kg)   LMP  (LMP Unknown)   BMI  34.22 kg/m    Physical Exam Vitals and nursing note reviewed.  Constitutional:      Appearance: She is well-developed.  HENT:  Head: Normocephalic and atraumatic.  Eyes:     Conjunctiva/sclera: Conjunctivae normal.  Cardiovascular:     Rate and Rhythm: Normal rate and regular rhythm.     Heart sounds: Normal heart sounds.  Pulmonary:     Effort: Pulmonary effort is normal.     Breath sounds: Normal breath sounds.  Abdominal:     General: Bowel sounds are normal.     Palpations: Abdomen is soft.  Musculoskeletal:     Cervical back: Normal range of motion.  Lymphadenopathy:     Cervical: No cervical adenopathy.  Skin:    General: Skin is warm and dry.     Capillary Refill: Capillary refill takes less than 2 seconds.  Neurological:     Mental Status: She is alert and oriented to person, place, and time.  Psychiatric:        Behavior: Behavior normal.      Musculoskeletal Exam: She had limited lateral rotation of the cervical spine.  She had bilateral trapezius spasm.  She had discomfort range of motion of her left shoulder joint.  Bilateral shoulder joints with good range of motion.  Elbow joints, wrist joints, MCPs and PIPs in good range of motion with no synovitis.  Hip joints with good range of motion.  Bilateral knee joints were replaced without any swelling or effusion.  There was no tenderness over ankles or MTPs.  CDAI Exam: CDAI Score: -- Patient Global: --; Provider Global: -- Swollen: --; Tender: -- Joint Exam 02/06/2022   No joint exam has been documented for this visit   There is currently no information documented on the homunculus. Go to the Rheumatology activity and complete the homunculus joint exam.  Investigation: No additional findings.  Imaging: No results found.  Recent Labs: Lab Results  Component Value Date   WBC 7.4 11/20/2021   HGB 13.2 11/20/2021   PLT 225 11/20/2021   NA 135 08/29/2021   K 3.8 08/29/2021   CL 98 08/29/2021   CO2  30 08/29/2021   GLUCOSE 104 (H) 08/29/2021   BUN 19 08/29/2021   CREATININE 0.89 08/29/2021   BILITOT 0.3 10/19/2021   ALKPHOS 122 (H) 10/19/2021   AST 25 10/19/2021   ALT 21 10/19/2021   PROT 6.6 10/19/2021   ALBUMIN 3.9 10/19/2021   CALCIUM 10.1 08/29/2021   GFRAA 82 10/01/2019    Speciality Comments: Narc Agreement: 01/16/18  Procedures:  No procedures performed Allergies: Ace inhibitors, Amitriptyline hcl, Atorvastatin, Cymbalta [duloxetine hcl], Dilantin [phenytoin sodium extended], Paroxetine, Ramipril, Rosuvastatin, Carbamazepine, Codeine, Paxlovid [nirmatrelvir-ritonavir], Phenytoin sodium extended, Simvastatin, and Wellbutrin [bupropion]   Assessment / Plan:     Visit Diagnoses: Primary osteoarthritis of both hands-she has bilateral PIP and DIP thickening with no synovitis.  Joint protection was discussed.  Chronic left shoulder pain-she has been having pain and stiffness in her left shoulder joint.  She had good range of motion with no point tenderness.  I gave her a handout on shoulder joint exercises.  We will see response to it.  I advised her to contact us if her symptoms persist.  Hx of total knee replacement, bilateral - Right total knee replacement May 22, 2021 by Dr. Lorre Nick.  She states that her right knee joint started hurting recently after kneeling.  No effusion or swelling was noted.  Trochanteric bursitis, right hip-she continues to have some tenderness over right trochanteric bursa.  A handout on IT band stretches was given.  Plantar fasciitis, right-she has ongoing discomfort in plantar fascia.  Planter fasciitis exercises were demonstrated.  DDD (degenerative disc disease), cervical-she had Raynauds range of motion of cervical spine with some discomfort.  Fibromyalgia-she continues to have generalized pain and discomfort from fibromyalgia.  She had positive tender points.  She is on tramadol and methocarbamol by pain management.  Trapezius muscle spasm-she  had bilateral trapezius spasm.  She states she has been getting trigger point injections by pain management.  Other medical problems listed as follows:  History of hypertension  History of hypercholesterolemia  History of TIA (transient ischemic attack)  Brain aneurysm  Hx of Clostridium difficile infection  History of diverticulosis  History of breast cancer  History of migraine  RLS (restless legs syndrome)  History of hypothyroidism  History of depression  History of sleep apnea  Orders: No orders of the defined types were placed in this encounter.  No orders of the defined types were placed in this encounter.    Follow-Up Instructions: Return in about 1 year (around 02/07/2023) for Osteoarthritis.   Bo Merino, MD  Note - This record has been created using Editor, commissioning.  Chart creation errors have been sought, but may not always  have been located. Such creation errors do not reflect on  the standard of medical care.

## 2022-02-06 NOTE — Patient Instructions (Signed)
Iliotibial Band Syndrome Rehab Ask your health care provider which exercises are safe for you. Do exercises exactly as told by your health care provider and adjust them as directed. It is normal to feel mild stretching, pulling, tightness, or discomfort as you do these exercises. Stop right away if you feel sudden pain or your pain gets significantly worse. Do not begin these exercises until told by your health care provider. Stretching and range-of-motion exercises These exercises warm up your muscles and joints and improve the movement and flexibility of your hip and pelvis. Quadriceps stretch, prone  Lie on your abdomen (prone position) on a firm surface, such as a bed or padded floor. Bend your left / right knee and reach back to hold your ankle or pant leg. If you cannot reach your ankle or pant leg, loop a belt around your foot and grab the belt instead. Gently pull your heel toward your buttocks. Your knee should not slide out to the side. You should feel a stretch in the front of your thigh and knee (quadriceps). Hold this position for __________ seconds. Repeat __________ times. Complete this exercise __________ times a day. Iliotibial band stretch An iliotibial band is a strong band of muscle tissue that runs from the outer side of your hip to the outer side of your thigh and knee. Lie on your side with your left / right leg in the top position. Bend both of your knees and grab your left / right ankle. Stretch out your bottom arm to help you balance. Slowly bring your top knee back so your thigh goes behind your trunk. Slowly lower your top leg toward the floor until you feel a gentle stretch on the outside of your left / right hip and thigh. If you do not feel a stretch and your knee will not fall farther, place the heel of your other foot on top of your knee and pull your knee down toward the floor with your foot. Hold this position for __________ seconds. Repeat __________ times.  Complete this exercise __________ times a day. Strengthening exercises These exercises build strength and endurance in your hip and pelvis. Endurance is the ability to use your muscles for a long time, even after they get tired. Straight leg raises, side-lying This exercise strengthens the muscles that rotate the leg at the hip and move it away from your body (hip abductors). Lie on your side with your left / right leg in the top position. Lie so your head, shoulder, hip, and knee line up. You may bend your bottom knee to help you balance. Roll your hips slightly forward so your hips are stacked directly over each other and your left / right knee is facing forward. Tense the muscles in your outer thigh and lift your top leg 4-6 inches (10-15 cm). Hold this position for __________ seconds. Slowly lower your leg to return to the starting position. Let your muscles relax completely before doing another repetition. Repeat __________ times. Complete this exercise __________ times a day. Leg raises, prone This exercise strengthens the muscles that move the hips backward (hip extensors). Lie on your abdomen (prone position) on your bed or a firm surface. You can put a pillow under your hips if that is more comfortable for your lower back. Bend your left / right knee so your foot is straight up in the air. Squeeze your buttocks muscles and lift your left / right thigh off the bed. Do not let your back arch. Tense   your thigh muscle as hard as you can without increasing any knee pain. Hold this position for __________ seconds. Slowly lower your leg to return to the starting position and allow it to relax completely. Repeat __________ times. Complete this exercise __________ times a day. Hip hike Stand sideways on a bottom step. Stand on your left / right leg with your other foot unsupported next to the step. You can hold on to a railing or wall for balance if needed. Keep your knees straight and your  torso square. Then lift your left / right hip up toward the ceiling. Slowly let your left / right hip lower toward the floor, past the starting position. Your foot should get closer to the floor. Do not lean or bend your knees. Repeat __________ times. Complete this exercise __________ times a day. This information is not intended to replace advice given to you by your health care provider. Make sure you discuss any questions you have with your health care provider. Document Revised: 10/14/2019 Document Reviewed: 10/14/2019 [employer] Patient Education  [place]. Shoulder Exercises Ask your health care provider which exercises are safe for you. Do exercises exactly as told by your health care provider and adjust them as directed. It is normal to feel mild stretching, pulling, tightness, or discomfort as you do these exercises. Stop right away if you feel sudden pain or your pain gets worse. Do not begin these exercises until told by your health care provider. Stretching exercises External rotation and abduction This exercise is sometimes called corner stretch. This exercise rotates your arm outward (external rotation) and moves your arm out from your body (abduction). Stand in a doorway with one of your feet slightly in front of the other. This is called a staggered stance. If you cannot reach your forearms to the door frame, stand facing a corner of a room. Choose one of the following positions as told by your health care provider: Place your hands and forearms on the door frame above your head. Place your hands and forearms on the door frame at the height of your head. Place your hands on the door frame at the height of your elbows. Slowly move your weight onto your front foot until you feel a stretch across your chest and in the front of your shoulders. Keep your head and chest upright and keep your abdominal muscles tight. Hold for __________ seconds. To release the stretch, shift your  weight to your back foot. Repeat __________ times. Complete this exercise __________ times a day. Extension, standing Stand and hold a broomstick, a cane, or a similar object behind your back. Your hands should be a little wider than shoulder width apart. Your palms should face away from your back. Keeping your elbows straight and your shoulder muscles relaxed, move the stick away from your body until you feel a stretch in your shoulders (extension). Avoid shrugging your shoulders while you move the stick. Keep your shoulder blades tucked down toward the middle of your back. Hold for __________ seconds. Slowly return to the starting position. Repeat __________ times. Complete this exercise __________ times a day. Range-of-motion exercises Pendulum  Stand near a wall or a surface that you can hold onto for balance. Bend at the waist and let your left / right arm hang straight down. Use your other arm to support you. Keep your back straight and do not lock your knees. Relax your left / right arm and shoulder muscles, and move your hips and your  trunk so your left / right arm swings freely. Your arm should swing because of the motion of your body, not because you are using your arm or shoulder muscles. Keep moving your hips and trunk so your arm swings in the following directions, as told by your health care provider: Side to side. Forward and backward. In clockwise and counterclockwise circles. Continue each motion for __________ seconds, or for as long as told by your health care provider. Slowly return to the starting position. Repeat __________ times. Complete this exercise __________ times a day. Shoulder flexion, standing  Stand and hold a broomstick, a cane, or a similar object. Place your hands a little more than shoulder width apart on the object. Your left / right hand should be palm up, and your other hand should be palm down. Keep your elbow straight and your shoulder muscles  relaxed. Push the stick up with your healthy arm to raise your left / right arm in front of your body, and then over your head until you feel a stretch in your shoulder (flexion). Avoid shrugging your shoulder while you raise your arm. Keep your shoulder blade tucked down toward the middle of your back. Hold for __________ seconds. Slowly return to the starting position. Repeat __________ times. Complete this exercise __________ times a day. Shoulder abduction, standing Stand and hold a broomstick, a cane, or a similar object. Place your hands a little more than shoulder width apart on the object. Your left / right hand should be palm up, and your other hand should be palm down. Keep your elbow straight and your shoulder muscles relaxed. Push the object across your body toward your left / right side. Raise your left / right arm to the side of your body (abduction) until you feel a stretch in your shoulder. Do not raise your arm above shoulder height unless your health care provider tells you to do that. If directed, raise your arm over your head. Avoid shrugging your shoulder while you raise your arm. Keep your shoulder blade tucked down toward the middle of your back. Hold for __________ seconds. Slowly return to the starting position. Repeat __________ times. Complete this exercise __________ times a day. Internal rotation  Place your left / right hand behind your back, palm up. Use your other hand to dangle an exercise band, a towel, or a similar object over your shoulder. Grasp the band with your left / right hand so you are holding on to both ends. Gently pull up on the band until you feel a stretch in the front of your left / right shoulder. The movement of your arm toward the center of your body is called internal rotation. Avoid shrugging your shoulder while you raise your arm. Keep your shoulder blade tucked down toward the middle of your back. Hold for __________ seconds. Release the  stretch by letting go of the band and lowering your hands. Repeat __________ times. Complete this exercise __________ times a day. Strengthening exercises External rotation  Sit in a stable chair without armrests. Secure an exercise band to a stable object at elbow height on your left / right side. Place a soft object, such as a folded towel or a small pillow, between your left / right upper arm and your body to move your elbow about 4 inches (10 cm) away from your side. Hold the end of the exercise band so it is tight and there is no slack. Keeping your elbow pressed against the soft object,  slowly move your forearm out, away from your abdomen (external rotation). Keep your body steady so only your forearm moves. Hold for __________ seconds. Slowly return to the starting position. Repeat __________ times. Complete this exercise __________ times a day. Shoulder abduction  Sit in a stable chair without armrests, or stand up. Hold a __________ weight in your left / right hand, or hold an exercise band with both hands. Start with your arms straight down and your left / right palm facing in, toward your body. Slowly lift your left / right hand out to your side (abduction). Do not lift your hand above shoulder height unless your health care provider tells you that this is safe. Keep your arms straight. Avoid shrugging your shoulder while you do this movement. Keep your shoulder blade tucked down toward the middle of your back. Hold for __________ seconds. Slowly lower your arm, and return to the starting position. Repeat __________ times. Complete this exercise __________ times a day. Shoulder extension Sit in a stable chair without armrests, or stand up. Secure an exercise band to a stable object in front of you so it is at shoulder height. Hold one end of the exercise band in each hand. Your palms should face each other. Straighten your elbows and lift your hands up to shoulder height. Step  back, away from the secured end of the exercise band, until the band is tight and there is no slack. Squeeze your shoulder blades together as you pull your hands down to the sides of your thighs (extension). Stop when your hands are straight down by your sides. Do not let your hands go behind your body. Hold for __________ seconds. Slowly return to the starting position. Repeat __________ times. Complete this exercise __________ times a day. Shoulder row Sit in a stable chair without armrests, or stand up. Secure an exercise band to a stable object in front of you so it is at waist height. Hold one end of the exercise band in each hand. Position your palms so that your thumbs are facing the ceiling (neutral position). Bend each of your elbows to a 90-degree angle (right angle) and keep your upper arms at your sides. Step back until the band is tight and there is no slack. Slowly pull your elbows back behind you. Hold for __________ seconds. Slowly return to the starting position. Repeat __________ times. Complete this exercise __________ times a day. Shoulder press-ups  Sit in a stable chair that has armrests. Sit upright, with your feet flat on the floor. Put your hands on the armrests so your elbows are bent and your fingers are pointing forward. Your hands should be about even with the sides of your body. Push down on the armrests and use your arms to lift yourself off the chair. Straighten your elbows and lift yourself up as much as you comfortably can. Move your shoulder blades down, and avoid letting your shoulders move up toward your ears. Keep your feet on the ground. As you get stronger, your feet should support less of your body weight as you lift yourself up. Hold for __________ seconds. Slowly lower yourself back into the chair. Repeat __________ times. Complete this exercise __________ times a day. Wall push-ups  Stand so you are facing a stable wall. Your feet should be about  one arm-length away from the wall. Lean forward and place your palms on the wall at shoulder height. Keep your feet flat on the floor as you bend your elbows and  lean forward toward the wall. Hold for __________ seconds. Straighten your elbows to push yourself back to the starting position. Repeat __________ times. Complete this exercise __________ times a day. This information is not intended to replace advice given to you by your health care provider. Make sure you discuss any questions you have with your health care provider. Document Revised: 08/01/2021 Document Reviewed: 09/05/2018 [employer] Patient Education  [place].

## 2022-02-06 NOTE — Progress Notes (Deleted)
Office Visit Note  Patient: [name]             Date of Birth: November 23, 1950           MRN: [id number]             PCP: Adriana Herter, NP Visit Date: 02/06/2022   Assessment:        Visit Diagnoses:  1. Primary osteoarthritis of both hands   2. Hx of total knee replacement, bilateral   3. Trochanteric bursitis, right hip   4. DDD (degenerative disc disease), cervical   5. Plantar fasciitis, right   6. Fibromyalgia   7. Trapezius muscle spasm   8. History of hypertension   9. History of hypercholesterolemia   10. History of TIA (transient ischemic attack)   11. Brain aneurysm   12. Hx of Clostridium difficile infection   13. History of diverticulosis   14. History of breast cancer   15. History of migraine   16. RLS (restless legs syndrome)   17. History of depression   18. History of hypothyroidism   19. History of sleep apnea      Follow-Up Instructions: No follow-ups on file.  Orders: No orders of the defined types were placed in this encounter.  No orders of the defined types were placed in this encounter.    Subjective:    Allergies: Ace inhibitors, Amitriptyline hcl, Atorvastatin, Cymbalta [duloxetine hcl], Dilantin [phenytoin sodium extended], Paroxetine, Ramipril, Rosuvastatin, Carbamazepine, Codeine, Paxlovid [nirmatrelvir-ritonavir], Phenytoin sodium extended, Simvastatin, and Wellbutrin [bupropion]   Activities of Daily Living: ***   History of Present Illness: [name] is a 71 y.o. female ***   Patient reports morning stiffness for 10 minutes.   Patient denies nocturnal pain.  Difficulty dressing/grooming: Denies Difficulty climbing stairs: Reports Difficulty getting out of chair: Denies Difficulty using hands for taps, buttons, cutlery, and/or writing: Denies   Review of Systems  Constitutional:  Negative for fatigue.  HENT:  Positive for mouth dryness.   Eyes:  Positive for dryness.  Respiratory:  Positive for shortness of  breath.   Cardiovascular:  Negative for swelling in legs/feet.  Gastrointestinal:  Negative for constipation.  Endocrine: Positive for excessive thirst.  Genitourinary:  Negative for difficulty urinating.  Musculoskeletal:  Positive for joint pain, gait problem, joint pain, joint swelling and morning stiffness.  Skin:  Negative for rash.  Allergic/Immunologic: Negative for susceptible to infections.  Neurological:  Negative for numbness.  Hematological:  Negative for bruising/bleeding tendency.  Psychiatric/Behavioral:  Negative for sleep disturbance.      Investigation: No additional findings.   Objective: Vital Signs: BP 135/78 (BP Location: Left Arm, Patient Position: Sitting, Cuff Size: Normal)   Pulse 87   Resp 16   Ht '5\' 6"'$  (1.676 m)   Wt 212 lb (96.2 kg)   LMP  (LMP Unknown)   BMI 34.22 kg/m    Physical Exam   Musculoskeletal Exam: ***  CDAI Exam: CDAI Score: -- Patient Global: --; Provider Global: -- Swollen: --; Tender: -- Joint Exam 02/06/2022   No joint exam has been documented for this visit   There is currently no information documented on the homunculus. Go to the Rheumatology activity and complete the homunculus joint exam.  Speciality Comments: Narc Agreement: 01/16/18  Imaging: No results found.   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   Localized swelling of right lower leg 08/29/2021   Tonsillar exudate 08/29/2021   Candida vaginitis 08/28/2021   Chronic pain syndrome 08/25/2021   Multifocal pneumonia 06/03/2021   Pre-operative respiratory examination 05/18/2021   Statin myopathy 04/19/2021   Pain in joint of right shoulder 02/10/2021   Arthritis of both acromioclavicular joints 01/02/2021   Trochanteric bursitis of right hip 10/03/2020   Tailbone injury 05/05/2020   UTI (urinary tract  infection) 05/05/2020   Insomnia 05/05/2020   Leg cramps 02/16/2020   Dysuria 02/16/2020   Right hip impingement syndrome 02/01/2020   Right bicipital tenosynovitis 02/01/2020   Bronchiectasis without complication ([place]) 23/53/6144   Myofascial pain 12/21/2019   Estrogen deficiency 12/10/2019   Obesity (BMI 30-39.9) 12/10/2019   Bronchiectasis with (acute) exacerbation ([place]) 10/23/2019   Elevated LFTs 10/15/2019   Lower abdominal pain 09/01/2019   Neck pain 10/25/2017   Paresthesias in left hand 10/25/2017   Paresthesia of foot, bilateral 04/12/2017   Primary osteoarthritis of both hands 09/27/2016   Primary osteoarthritis of both knees 09/27/2016   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   Nasopharyngitis, 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/2016   S/P total knee replacement 02/13/2016   Chronic pain of both knees 12/06/2015   Globus pharyngeus 05/17/2015   Genetic testing 12/23/2014   Family history of breast cancer    Family history of colon cancer    Family history of pancreatic cancer    History of breast cancer 11/23/2014   Allergy to ACE inhibitors 09/27/2014   Prediabetes 06/23/2013   RLS (restless legs syndrome) 01/15/2013   Hx of Clostridium difficile infection 10/10/2012   Dyspnea on exertion 04/02/2012   NICM (nonischemic cardiomyopathy) ([place]) 03/07/2012   Post-menopausal 01/11/2012   Atypical chest pain 11/07/2011   Obstructive sleep apnea 04/04/2010   V-tach ([place]) 01/03/2009   ICD  [employer]  Single chamber 01/03/2009   PFO (patent foramen ovale) 09/29/2008   Vitamin D deficiency 08/17/2008   Hypothyroidism 11/18/2006   Hyperlipidemia 11/18/2006   Depression with anxiety 11/18/2006   Essential hypertension 11/18/2006   Mitral valve prolapse 11/18/2006   Cerebral aneurysm 11/18/2006   Allergic rhinitis 11/18/2006   GERD 11/18/2006   Diverticulosis of colon 11/18/2006   Fatty liver 11/18/2006   Fibromyalgia 11/18/2006    Past Medical History:  Diagnosis Date    AICD (automatic cardioverter/defibrillator) present    Anxiety    Arthritis    Back pain    Breast cancer ([place]) 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   Bronchiectasis ([place])    Cerebral aneurysm, nonruptured    had a clip put in   CHF (congestive heart failure) ([place])    Clostridium difficile infection    Depressive disorder, not elsewhere classified    Diverticulosis of colon (without mention of hemorrhage)    Esophageal reflux    Fatty liver    Fibromyalgia    Gastritis    GERD (gastroesophageal reflux disease)    GI bleed 2004   Glaucoma    Hiatal hernia    History of COVID-19 05/04/2021   Hyperlipidemia    Hypertension    Hypothyroidism    Internal hemorrhoids    Joint pain    Multifocal pneumonia 06/03/2021   Obstructive sleep apnea (adult) (pediatric)    Osteoarthritis    Ostium secundum type atrial septal defect    Other chronic nonalcoholic liver disease    Other pulmonary embolism and infarction    Palpitations    Paroxysmal ventricular  tachycardia ([place])    Personal history of radiation therapy    PONV (postoperative nausea and vomiting)    Presence of permanent cardiac pacemaker    PUD (peptic ulcer disease)    Radiation 02/03/15-03/10/15   Right Breast   Sarcoid    per pt , not sure   Schatzki's ring    Shortness of breath    Sleep apnea    Stroke [facility]) 2013   tia/ pt feels it was around 2008 0r 2009   Takotsubo syndrome    Tubular adenoma of colon    Unspecified transient cerebral ischemia    Unspecified vitamin D deficiency     Family History  Problem Relation Age of Onset   Diabetes Mother    Alzheimer's disease Mother    Hypertension Mother    Obesity Mother    Other Brother        Thyroid problem 10/2016   Allergies Sister    Heart disease Sister    Heart disease Brother    Prostate cancer Brother 66       same brother as throat cancer   Throat cancer Brother        dx in his 45s; also a smoker   Heart disease Father    Cancer  Maternal Grandmother 56       colon cancer or abdominal cancer   Lung cancer Maternal Grandfather 78   Breast cancer Maternal Aunt 55   Colon cancer Maternal Aunt 61       same sister as breast at 62   Lung cancer Maternal Uncle    Breast cancer Maternal Aunt        dx in her 16s   Pancreatic cancer Cousin 60       maternal first cousin   Breast cancer Cousin        paternal first cousin twice removed died in her 75s   Heart disease Son        Cardiac Arrest 07/2016   Stroke Neg Hx    Past Surgical History:  Procedure Laterality Date   ABI  2006   normal   BREAST EXCISIONAL BIOPSY     BREAST LUMPECTOMY WITH RADIOACTIVE SEED LOCALIZATION Right 01/03/2015   Procedure: BREAST LUMPECTOMY WITH RADIOACTIVE SEED LOCALIZATION;  Surgeon: Autumn Messing III, MD;  Location: [facility];  Service: General;  Laterality: Right;   CARDIAC DEFIBRILLATOR PLACEMENT  2006; 2012   BSX single chamber ICD   carotid dopplers  2006   neg   CEREBRAL ANEURYSM REPAIR  02/1999   COLONOSCOPY     HAMMER TOE SURGERY  11/15/2020   3rd digit bilateral feet    hospitalization  2004   GI bleed, PUD, diverticulosis (EGD,colonscopy)   hospitalization     PE, NSVT, s/p defib   KNEE ARTHROSCOPY     bilateral   LEFT HEART CATHETERIZATION WITH CORONARY ANGIOGRAM N/A 02/22/2012   Procedure: LEFT HEART CATHETERIZATION WITH CORONARY ANGIOGRAM;  Surgeon: Burnell Blanks, MD;  Location[facility] C[facility];  Service: Cardiovascular;  Laterality: N/A;   PARTIAL HYSTERECTOMY     Fibroids   TONGUE BIOPSY  12/12/2017   due to sore tongue and white patches/abnormal cells   TOTAL KNEE ARTHROPLASTY Left 02/13/2016   Procedure: TOTAL KNEE ARTHROPLASTY;  Surgeon: Vickey Huger, MD;  Location: [place];  Service: Orthopedics;  Laterality: Left;   TOTAL KNEE ARTHROPLASTY Right 05/22/2021   Procedure: TOTAL KNEE ARTHROPLASTY;  Surgeon: Vickey Huger, MD;  Location: [facility];  Service: Orthopedics;  Laterality: Right;  with block   UPPER  GASTROINTESTINAL ENDOSCOPY     Social History   Social History Narrative   Lives alone   Caffeine O[employer]   Retired from [employer]   2 children -- 5 grandbabies   Live alone     Procedures:  No procedures performed  [employer], RT  Note - This record has been created using [employer]. Chart creation errors have been sought, but may not always have been located. Such creation errors do not reflect on the standard of medical care.

## 2022-02-22 ENCOUNTER — Ambulatory Visit: Payer: Medicare Other | Admitting: Pulmonary Disease

## 2022-02-22 ENCOUNTER — Encounter: Payer: Medicare Other | Attending: Obstetrics and Gynecology | Admitting: Physical Therapy

## 2022-02-22 ENCOUNTER — Encounter: Payer: Self-pay | Admitting: Physical Therapy

## 2022-02-22 DIAGNOSIS — R3915 Urgency of urination: Secondary | ICD-10-CM | POA: Diagnosis present

## 2022-02-22 DIAGNOSIS — R278 Other lack of coordination: Secondary | ICD-10-CM | POA: Insufficient documentation

## 2022-02-22 DIAGNOSIS — N3281 Overactive bladder: Secondary | ICD-10-CM | POA: Diagnosis not present

## 2022-02-22 DIAGNOSIS — M6281 Muscle weakness (generalized): Secondary | ICD-10-CM | POA: Insufficient documentation

## 2022-02-22 NOTE — Therapy (Signed)
OUTPATIENT PHYSICAL THERAPY TREATMENT NOTE   Patient Name: [name] MRN: [id number] DOB:[birth year], 71 y.o., female Today's Date: 02/22/2022  PCP: Durene Fruits, NP REFERRING PROVIDER: Sherlene Shams, MD  END OF SESSION:   PT End of Session - 02/22/22 1039     Visit Number 4    Date for PT Re-Evaluation 04/03/22    Authorization Type Medicare    Authorization - Visit Number 4    Authorization - Number of Visits 10    PT Start Time 2202    PT Stop Time 1100   left early for a doctors appointment   PT Time Calculation (min) 30 min    Activity Tolerance Patient tolerated treatment well    Behavior During Therapy WFL for tasks assessed/performed             Past Medical History:  Diagnosis Date   AICD (automatic cardioverter/defibrillator) present    Anxiety    Arthritis    Back pain    Breast cancer ([place]) 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   Bronchiectasis ([place])    Cerebral aneurysm, nonruptured    had a clip put in   CHF (congestive heart failure) ([place])    Clostridium difficile infection    Depressive disorder, not elsewhere classified    Diverticulosis of colon (without mention of hemorrhage)    Esophageal reflux    Fatty liver    Fibromyalgia    Gastritis    GERD (gastroesophageal reflux disease)    GI bleed 2004   Glaucoma    Hiatal hernia    History of COVID-19 05/04/2021   Hyperlipidemia    Hypertension    Hypothyroidism    Internal hemorrhoids    Joint pain    Multifocal pneumonia 06/03/2021   Obstructive sleep apnea (adult) (pediatric)    Osteoarthritis    Ostium secundum type atrial septal defect    Other chronic nonalcoholic liver disease    Other pulmonary embolism and infarction    Palpitations    Paroxysmal ventricular tachycardia ([place])    Personal history of radiation therapy    PONV (postoperative nausea and vomiting)    Presence of permanent cardiac pacemaker    PUD (peptic ulcer disease)    Radiation  02/03/15-03/10/15   Right Breast   Sarcoid    per pt , not sure   Schatzki's ring    Shortness of breath    Sleep apnea    Stroke [facility]) 2013   tia/ pt feels it was around 2008 0r 2009   Takotsubo syndrome    Tubular adenoma of colon    Unspecified transient cerebral ischemia    Unspecified vitamin D deficiency    Past Surgical History:  Procedure Laterality Date   ABI  2006   normal   BREAST EXCISIONAL BIOPSY     BREAST LUMPECTOMY WITH RADIOACTIVE SEED LOCALIZATION Right 01/03/2015   Procedure: BREAST LUMPECTOMY WITH RADIOACTIVE SEED LOCALIZATION;  Surgeon: Autumn Messing III, MD;  Location: [facility];  Service: General;  Laterality: Right;   CARDIAC DEFIBRILLATOR PLACEMENT  2006; 2012   BSX single chamber ICD   carotid dopplers  2006   neg   CEREBRAL ANEURYSM REPAIR  02/1999   COLONOSCOPY     HAMMER TOE SURGERY  11/15/2020   3rd digit bilateral feet    hospitalization  2004   GI bleed, PUD, diverticulosis (EGD,colonscopy)   hospitalization     PE, NSVT, s/p defib   KNEE ARTHROSCOPY  bilateral   LEFT HEART CATHETERIZATION WITH CORONARY ANGIOGRAM N/A 02/22/2012   Procedure: LEFT HEART CATHETERIZATION WITH CORONARY ANGIOGRAM;  Surgeon: Burnell Blanks, MD;  Location: [facility] CATH LAB;  Service: Cardiovascular;  Laterality: N/A;   PARTIAL HYSTERECTOMY     Fibroids   TONGUE BIOPSY  12/12/2017   due to sore tongue and white patches/abnormal cells   TOTAL KNEE ARTHROPLASTY Left 02/13/2016   Procedure: TOTAL KNEE ARTHROPLASTY;  Surgeon: Vickey Huger, MD;  Location: [facility];  Service: Orthopedics;  Laterality: Left;   TOTAL KNEE ARTHROPLASTY Right 05/22/2021   Procedure: TOTAL KNEE ARTHROPLASTY;  Surgeon: Vickey Huger, MD;  Location: [facility];  Service: Orthopedics;  Laterality: Right;  with block   UPPER GASTROINTESTINAL ENDOSCOPY     Patient Active Problem List   Diagnosis Date Noted   Upper airway cough syndrome 01/19/2022   Chronic left shoulder pain 11/13/2021   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   Localized swelling of right lower leg 08/29/2021   Tonsillar exudate 08/29/2021   Candida vaginitis 08/28/2021   Chronic pain syndrome 08/25/2021   Multifocal pneumonia 06/03/2021   Pre-operative respiratory examination 05/18/2021   Statin myopathy 04/19/2021   Pain in joint of right shoulder 02/10/2021   Arthritis of both acromioclavicular joints 01/02/2021   Trochanteric bursitis of right hip 10/03/2020   Tailbone injury 05/05/2020   UTI (urinary tract infection) 05/05/2020   Insomnia 05/05/2020   Leg cramps 02/16/2020   Dysuria 02/16/2020   Right hip impingement syndrome 02/01/2020   Right bicipital tenosynovitis 02/01/2020   Bronchiectasis without complication ([place]) 65/78/4696   Myofascial pain 12/21/2019   Estrogen deficiency 12/10/2019   Obesity (BMI 30-39.9) 12/10/2019   Bronchiectasis with (acute) exacerbation ([place]) 10/23/2019   Elevated LFTs 10/15/2019   Lower abdominal pain 09/01/2019   Neck pain 10/25/2017   Paresthesias in left hand 10/25/2017   Paresthesia of foot, bilateral 04/12/2017   Primary osteoarthritis of both hands 09/27/2016   Primary osteoarthritis of both knees 09/27/2016   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   Nasopharyngitis, 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/2016   S/P total knee replacement 02/13/2016   Chronic pain of both knees 12/06/2015   Globus pharyngeus 05/17/2015   Genetic testing 12/23/2014   Family history of breast cancer    Family history of colon cancer    Family history of pancreatic cancer    History of breast cancer 11/23/2014   Allergy to ACE inhibitors 09/27/2014   Prediabetes 06/23/2013   RLS (restless legs syndrome) 01/15/2013   Hx of Clostridium difficile infection 10/10/2012   Dyspnea on exertion 04/02/2012   NICM (nonischemic cardiomyopathy) ([place]) 03/07/2012   Post-menopausal 01/11/2012   Atypical chest pain 11/07/2011   Obstructive sleep apnea 04/04/2010   V-tach ([place]) 01/03/2009   ICD  [employer]  Single  chamber 01/03/2009   PFO (patent foramen ovale) 09/29/2008   Vitamin D deficiency 08/17/2008   Hypothyroidism 11/18/2006   Hyperlipidemia 11/18/2006   Depression with anxiety 11/18/2006   Essential hypertension 11/18/2006   Mitral valve prolapse 11/18/2006   Cerebral aneurysm 11/18/2006   Allergic rhinitis 11/18/2006   GERD 11/18/2006   Diverticulosis of colon 11/18/2006   Fatty liver 11/18/2006   Fibromyalgia 11/18/2006  REFERRING DIAG:  N32.81 Overactive Bladder   THERAPY DIAG:  Muscle weakness (generalized)   Other lack of coordination   Urinary urgency   Rationale for Evaluation and Treatment Rehabilitation   PERTINENT HISTORY: Partial hysterectomy; breast cancer 2016; Glaucoma; Hypothyroidism; Fibromyalgia; CHF; Cerebral Aneurysm repair 02/1999; TKR bil.  PRECAUTIONS:  ICD/Pacemaker    SUBJECTIVE: Patient reports she had increased leakage while on vacation. She had to wear pads all the time and while in the pool.   PAIN:  Are you having pain? No     OBJECTIVE: (objective measures completed at initial evaluation unless otherwise dated)   PATIENT GOALS reducing the urgency and urinary leakage   BOWEL MOVEMENT Pain with bowel movement: No Type of bowel movement:Type (Bristol Stool Scale) Type 4 and if not taking her Miralax is Type 3, Frequency 1-3 days, and Strain No Fully empty rectum: No, due to feeling like stuff is still in the rectum Fiber supplement: Yes: Miralax   URINATION Pain with urination: No Fully empty bladder: No due to always being in a rush; massage the lower abdomen so more urine comes out Stream:  not as strong as it use to be and in a stream Urgency: Yes: when gets home, night times  and will pee on her leg; she has put a portable toilet by her bed to help Frequency: every 2 hours Leakage: Urge to void, Walking to the bathroom, Coughing, Sneezing, and Bending forward Pads: Yes: 2 poise pads per day PATIENT SURVEYS:  PFIQ-7 UIQ-7 19    COGNITION:            Overall cognitive status: Within functional limits for tasks assessed                              LUMBARAROM/PROM   A/PROM A/PROM  eval  Flexion Tightness in the lumbar region  Extension Decreased by 25%  Right lateral flexion Decreased by 25%  Left lateral flexion Decreased by 25%   (Blank rows = not tested)   LOWER EXTREMITY ROM:   Passive ROM Right eval Left eval  Hip external rotation 40 40   (Blank rows = not tested)   LOWER EXTREMITY MMT:   MMT Right eval Left eval  Hip flexion 4/5 4/5  Hip extension 3/5 3/5  Hip abduction 3/5 3/5  Hip adduction 4/5 4/5  Hip internal rotation 4/5 4/5  Hip external rotation 4/5 4/5    PELVIC MMT:   MMT eval  Vaginal 2/5  (Blank rows = not tested)         PALPATION:   General  decreased mobility of the hysterectomy scar on her abdomen. Umbilical hernia. Difficulty with contracting her lower abdomen.                  External Perineal Exam tenderness located on the right lower pubic bone. Dryness vaginally. ; 01/30/2022 examined the genitalia and saw just above the clitorus an are that is split open and where her pain is coming from, she has itching where ithe perineal body is.                              Internal Pelvic Floor tenderness located on the sides of the bladder. Initial pelvic floor contraction she would push the therapist finger out.    TONE: average   PROLAPSE: Anterior wall came downward slightly   TODAY'S TREATMENT  02/22/2022 Neuromuscular re-education: Pelvic floor contraction training:hookly with ball squeeze and pelvic floor contraction 10x Hookly hip clamshell with red band and pelvic floor contraction Bridge with ball squeeze 10x  Exercises: Stretches/mobility:single knee to chest holding 15 sec each leg Trunk rotation stretch holding 30 sec each side  Piriformis stretch supine with figure 4 holding 30 sec each side.  Double knee to chest Therapeutic  activities: Functional strengthening activities: Self-care:     01/30/2022 Manual: Internal pelvic floor techniques: urogenital diaphragm releas through the different restrictions No emotional/communication barriers or cognitive limitation. Patient is motivated to learn. Patient understands and agrees with treatment goals and plan. PT explains patient will be examined in standing, sitting, and lying down to see how their muscles and joints work. When they are ready, they will be asked to remove their underwear so PT can examine their perineum. The patient is also given the option of providing their own chaperone as one is not provided in our facility. The patient also has the right and is explained the right to defer or refuse any part of the evaluation or treatment including the internal exam. With the patient's consent, PT will use one gloved finger to gently assess the muscles of the pelvic floor, seeing how well it contracts and relaxes and if there is muscle symmetry. After, the patient will get dressed and PT and patient will discuss exam findings and plan of care. PT and patient discuss plan of care, schedule, attendance policy and HEP activities. : Neuromuscular re-education: Pelvic floor contraction training:pelvic floor contraction with therapist placing her hands on the urogenital diaphragm to assist the pelvic floor to contract and lift.    Exercises: Stretches/mobility:sitting hamstring stretch hold 30 sec bil.                                      Piriformis stretch in sitting holding for 30 sec bil.                                      Happy baby stretch                                     Standing lumbar extension  Self-care: Discussed with patient the side effects of Jardiance and it is yeast infections. She will contact Dr. Wannetta Sender. She is using the V-magic cream but still has itching vaginally.  Educated patient on seeing     01/25/2022 Manual: Soft tissue mobilization:  Manual work to the lower abdomen to work on tissue release Scar tissue mobilization: to the c-section scar improving mobility and educated patient on how to perform the scar massage at home Exercises: Strengthening: abdominal contraction with ball squeeze with tactile cues and verbal cues Self-care:  Reviewed with patient on vaginal moisturizers  and washes that are safe for the vaginal area.      PATIENT EDUCATION: 02/22/2022 Education details:  Access Code: [email or web address], Person educated: Patient Education method: Explanation, Demonstration, Tactile cues, Verbal cues, and Handouts Education comprehension: verbalized understanding, returned demonstration, verbal cues required, tactile cues required, and needs further education       HOME EXERCISE PROGRAM: 7/6//2023 Access Code: [email or web address] URL: [email or web address] Date: 02/22/2022 Prepared by: Earlie Counts  Exercises - Supine Figure 4 Piriformis Stretch  - 1 x daily - 7 x weekly - 1 sets - 2 reps - 15 sec hold - Hooklying Single Knee to Chest Stretch  - 1 x daily - 7 x weekly - 1 sets - 2 reps - 30 sec hold - Supine Lower Trunk  Rotation  - 1 x daily - 7 x weekly - 1 sets - 2 reps - 30 sec hold - Standing Lumbar Extension  - 1 x daily - 7 x weekly - 1 sets - 5 reps - Seated Hamstring Stretch  - 1 x daily - 7 x weekly - 1 sets - 2 reps - 30 sec hold - Seated Piriformis Stretch  - 1 x daily - 7 x weekly - 1 sets - 2 reps - 30 sec hold - Seated Happy Baby With Trunk Flexion For Pelvic Relaxation  - 1 x daily - 7 x weekly - 1 sets - 2 reps - 30 sec hold - Seated Pelvic Floor Contraction  - 3 x daily - 7 x weekly - 3 sets - 5 reps - 5 sec hold - Hooklying Clamshell with Resistance  - 1 x daily - 3 x weekly - 1 sets - 10 reps - Supine Bridge with Mini Swiss Ball Between Knees  - 1 x daily - 3 x weekly - 1 sets - 10 reps - Supine Hip Adduction Isometric with Ball  - 1 x daily - 3 x weekly - 1 sets - 10 reps - 5 sec hold    ASSESSMENT:   CLINICAL IMPRESSION: Patient is a 71 y.o. female who was seen today for physical therapy  treatment for overactive bladder. Patient went on vacation and has difficulty with her urinary leakage. Patient had to wear more pads and continues to have a rash on the vaginal and groin area. Patient was able to feel the pelvic floor contraction with her exericses.  Patient feels her urgency is less. Patient will benefit from skilled therapy to improve continence, pelvic floor coordination and strength.      OBJECTIVE IMPAIRMENTS decreased activity tolerance, decreased coordination, decreased endurance, decreased strength, and increased fascial restrictions.    ACTIVITY LIMITATIONS continence   PARTICIPATION LIMITATIONS: community activity   PERSONAL FACTORS Fitness and 3+ comorbidities: Pacemaker, partial hysterectomy; H/o breast cancer, stroke 2013, Fibromyalgia; CHF  are also affecting patient's functional outcome.    REHAB POTENTIAL: Good   CLINICAL DECISION MAKING: Stable/uncomplicated   EVALUATION COMPLEXITY: Low     GOALS: Goals reviewed with patient? Yes   SHORT TERM GOALS: Target date: 02/06/2022   Patient educated on vaginal moisturizers to reduce her dryness and irritation to the vaginal tissue Baseline: Goal status: Met 01/30/2022  2.  Patient understands the importance of drinking more water to reduce the urge to urinate.  Baseline:  Goal status: Met 01/30/2022   3.  Patient able to consistently contract the pelvic floor without bulging the perineum.  Baseline:  Goal status: met 01/30/2022   4.  Patient understands how to mobilize her hysterectomy scar to reduce fascial restrictions.  Baseline:  Goal status: INITIAL     LONG TERM GOALS: Target date: 04/03/2022    Patient is independent with advanced HEP for pelvic floor, hips and core to reduce her leakage.  Baseline:  Goal status: INITIAL   2.  Patient reports her vaginal dryness and irritation decreased  >/= 75% due to improve vaginal health Baseline:  Goal status: INITIAL   3.  Patient is able to walk to the bathroom with minimal to no urinary leakage due to improved pelvic floor coordination and strength.  Baseline:  Goal status: INITIAL   4.  Patient reports her urinary leakage with coughing and sneezing decreased >/= 75% due to increased in pelvic floor strength >/= 3/5.  Baseline:  Goal  status: INITIAL   5.  Patient reports her urinary urgency decreased >/= 75% due to improved pelvic floor coordination.  Baseline:  Goal status: INITIAL       PLAN: PT FREQUENCY: 1x/week   PT DURATION: 12 weeks   PLANNED INTERVENTIONS: Therapeutic exercises, Therapeutic activity, Neuromuscular re-education, Patient/Family education, Joint mobilization, Dry Needling, Moist heat, and Manual therapy   PLAN FOR NEXT SESSION: manual work to the sides of the bladder and introitus, see if she contacted the doctor about her itching, work on pelvic floor contraction with a lift, urogenital release, scar massage to the hysterectomy scar, lower abdominal bracing, check pelvic floor strength   Earlie Counts, PT 02/22/22 11:14 AM

## 2022-02-26 ENCOUNTER — Encounter
Payer: Medicare Other | Attending: Physical Medicine and Rehabilitation | Admitting: Physical Medicine and Rehabilitation

## 2022-02-26 ENCOUNTER — Other Ambulatory Visit: Payer: Self-pay | Admitting: Family

## 2022-02-26 ENCOUNTER — Ambulatory Visit: Payer: Medicare Other | Admitting: Physical Therapy

## 2022-02-26 ENCOUNTER — Encounter: Payer: Self-pay | Admitting: Physical Medicine and Rehabilitation

## 2022-02-26 VITALS — BP 130/83 | HR 71 | Ht 66.0 in | Wt 211.0 lb

## 2022-02-26 DIAGNOSIS — M25512 Pain in left shoulder: Secondary | ICD-10-CM | POA: Diagnosis present

## 2022-02-26 DIAGNOSIS — G8929 Other chronic pain: Secondary | ICD-10-CM

## 2022-02-26 DIAGNOSIS — M7918 Myalgia, other site: Secondary | ICD-10-CM | POA: Diagnosis present

## 2022-02-26 DIAGNOSIS — M545 Low back pain, unspecified: Secondary | ICD-10-CM

## 2022-02-26 DIAGNOSIS — Z1231 Encounter for screening mammogram for malignant neoplasm of breast: Secondary | ICD-10-CM

## 2022-02-26 DIAGNOSIS — G894 Chronic pain syndrome: Secondary | ICD-10-CM

## 2022-02-26 NOTE — Patient Instructions (Signed)
steroid injection was performed at L subacromial steroid injection using 1% plain Lidocaine and '40mg'$  /1cc of Kenalog. This was well tolerated.  Cleaned with betadine x3 and allowed to dry- then alcohol then injected using 27 gauge 1.5 inch needle- no bleeding or complications.    F/U in 3 months for steroid injections of B/L subacromial injections.  Lidocaine will kick in 15 minutes- and wear off tonight- the steroid will kick in tomorrow within 24 hours and take up to 72 hours to fully kick in.   2. Patient here for trigger point injections for L low back pain  Consent done and on chart.  Cleaned areas with alcohol and injected using a 27 gauge 1.5 inch needle  Injected 2cc Using 1% Lidocaine with no EPI  Upper traps Levators Posterior scalenes Middle scalenes Splenius Capitus Pectoralis Major Rhomboids Infraspinatus Teres Major/minor Thoracic paraspinals Lumbar paraspinals- B/L - 1 on R; 4 on L Other injections-    Patient's level of pain prior was 5/10-  Current level of pain after injections is 5/10  There was no bleeding or complications.  Patient was advised to drink a lot of water on day after injections to flush system Will have increased soreness for 12-48 hours after injections.  Can use Lidocaine patches the day AFTER injections Can use theracane on day of injections in places didn't inject Can use  ice or heating pad 4-6 hours AFTER injections   3. F/U in 3 months- B/L subacromial inj and trigger point injections.

## 2022-02-26 NOTE — Progress Notes (Signed)
Patient is a 71 yr old R handed female with hx of  Bronchiesctasis, CHF, prediabetes- with A1c of 5.6- as of 02/15/21- - not on blood thinners, has a defibrillator, GERD, and fibromyalgia issues- dx'd 10 years ago.  Here for f/u on chronic pain and trigger oint injections. Last got subacromial shoulder injections B/L on 03/29/21.  S/P R TKR 10/22; also COVID B/L pneumonia now on O2 full time Here for B/L shoulder injections- last done 08/25/21- cannot be done before 3 months in future  Low back pain - is acting up.   Pain in shoulders Taking Baclofen at night time- strained back and shoulders somewhat.  BP looks a lot better- 130s/83.    Sleeping much better with Trazodone 100 mg QHS for sleep.   Tramadol- takes ~2-3x/week - total ~ 4-5 pills/week.     Plan:  steroid injection was performed at L subacromial steroid injection using 1% plain Lidocaine and '40mg'$  /1cc of Kenalog. This was well tolerated.  Cleaned with betadine x3 and allowed to dry- then alcohol then injected using 27 gauge 1.5 inch needle- no bleeding or complications.    F/U in 3 months for steroid injections of B/L subacromial injections.  Lidocaine will kick in 15 minutes- and wear off tonight- the steroid will kick in tomorrow within 24 hours and take up to 72 hours to fully kick in.   2. Patient here for trigger point injections for L low back pain  Consent done and on chart.  Cleaned areas with alcohol and injected using a 27 gauge 1.5 inch needle  Injected 2cc Using 1% Lidocaine with no EPI  Upper traps Levators Posterior scalenes Middle scalenes Splenius Capitus Pectoralis Major Rhomboids Infraspinatus Teres Major/minor Thoracic paraspinals Lumbar paraspinals- B/L - 1 on R; 4 on L Other injections-    Patient's level of pain prior was 5/10-  Current level of pain after injections is 5/10  There was no bleeding or complications.  Patient was advised to drink a lot of water on day after  injections to flush system Will have increased soreness for 12-48 hours after injections.  Can use Lidocaine patches the day AFTER injections Can use theracane on day of injections in places didn't inject Can use  ice or heating pad 4-6 hours AFTER injections   3. F/U in 3 months- B/L subacromial inj and trigger point injections.  4. Doesn't need refills- con't baclofen and tramadol.   I spent a total of  25  minutes on total care today- >50% coordination of care- due to 15 minutes on injections and 10 minutes discussing options.

## 2022-02-28 ENCOUNTER — Other Ambulatory Visit: Payer: Self-pay | Admitting: Family

## 2022-02-28 ENCOUNTER — Ambulatory Visit (INDEPENDENT_AMBULATORY_CARE_PROVIDER_SITE_OTHER): Payer: Medicare Other

## 2022-02-28 DIAGNOSIS — I428 Other cardiomyopathies: Secondary | ICD-10-CM

## 2022-02-28 DIAGNOSIS — E038 Other specified hypothyroidism: Secondary | ICD-10-CM

## 2022-02-28 LAB — CUP PACEART REMOTE DEVICE CHECK
Battery Remaining Longevity: 18 mo
Battery Remaining Percentage: 23 %
Brady Statistic RV Percent Paced: 0 %
Date Time Interrogation Session: 20230712030200
HighPow Impedance: 78 Ohm
Implantable Lead Implant Date: 20050603
Implantable Lead Location: 753860
Implantable Lead Model: 185
Implantable Lead Serial Number: 116340
Implantable Pulse Generator Implant Date: 20110505
Lead Channel Impedance Value: 636 Ohm
Lead Channel Pacing Threshold Amplitude: 0.6 V
Lead Channel Pacing Threshold Pulse Width: 0.4 ms
Lead Channel Setting Pacing Amplitude: 2.4 V
Lead Channel Setting Pacing Pulse Width: 0.4 ms
Lead Channel Setting Sensing Sensitivity: 0.4 mV
Pulse Gen Serial Number: 266301

## 2022-03-01 ENCOUNTER — Encounter: Payer: Self-pay | Admitting: Internal Medicine

## 2022-03-01 NOTE — Telephone Encounter (Signed)
At this point we would not replace the Jardiance.  We worry about the risk of infection with it or its similar drugs

## 2022-03-02 ENCOUNTER — Telehealth: Payer: Self-pay | Admitting: Pulmonary Disease

## 2022-03-02 MED ORDER — ALBUTEROL SULFATE HFA 108 (90 BASE) MCG/ACT IN AERS: 2.0000 | INHALATION_SPRAY | Freq: Four times a day (QID) | RESPIRATORY_TRACT | 3 refills | Status: DC | PRN

## 2022-03-02 NOTE — Telephone Encounter (Signed)
New RX has been sent to [facility]. Nothing further needed at this time.

## 2022-03-05 ENCOUNTER — Encounter: Payer: Self-pay | Admitting: Pulmonary Disease

## 2022-03-05 ENCOUNTER — Ambulatory Visit (INDEPENDENT_AMBULATORY_CARE_PROVIDER_SITE_OTHER): Payer: Medicare Other | Admitting: Pulmonary Disease

## 2022-03-05 VITALS — BP 116/68 | HR 72 | Ht 65.5 in | Wt 215.2 lb

## 2022-03-05 DIAGNOSIS — J849 Interstitial pulmonary disease, unspecified: Secondary | ICD-10-CM | POA: Diagnosis not present

## 2022-03-05 DIAGNOSIS — I2510 Atherosclerotic heart disease of native coronary artery without angina pectoris: Secondary | ICD-10-CM | POA: Diagnosis not present

## 2022-03-05 DIAGNOSIS — I2584 Coronary atherosclerosis due to calcified coronary lesion: Secondary | ICD-10-CM

## 2022-03-05 DIAGNOSIS — J9801 Acute bronchospasm: Secondary | ICD-10-CM | POA: Diagnosis not present

## 2022-03-05 MED ORDER — NYSTATIN 100000 UNIT/ML MT SUSP: 5.0000 mL | Freq: Four times a day (QID) | OROMUCOSAL | 0 refills | Status: DC

## 2022-03-05 NOTE — Patient Instructions (Signed)
Nystatin 5 ml swish and swallow 4 times daily for 5 days to treat thrush  Will arrange for a spacer device to use with your inhalers  Will arrange for a face mask to use with your home nebulizer machine  Will schedule pulmonary function test and high resolution CT chest from November 2023, and follow up in 4 months after these are done

## 2022-03-05 NOTE — Progress Notes (Signed)
[name], Critical Care, and Sleep Medicine  Chief Complaint  Patient presents with   Follow-up    Pt f/u on OSA, pt complains of having a dry mouth when using CPAP. Pt does feel like she's getting an adequate amount of rest     Constitutional:  BP 116/68   Pulse 72   Ht 5' 5.5" (1.664 m)   Wt 215 lb 3.2 oz (97.6 kg)   LMP  (LMP Unknown)   SpO2 98%   BMI 35.27 kg/m   Past Medical History:  Vit D deficiency, Takotsubo CM, Schatzki's ring, PUD, VT s/p AICD, PE, NASH, ASD, Hypothyroidism, HTN, HLD, HH, GERD, Fibromyalgia, Diverticulosis, Depression, C diff, Cerebral aneurysm, Breast cancer 2016, OA, COVID 19 pneumonia September 2022  Past Surgical History:  Her  has a past surgical history that includes Partial hysterectomy; Cerebral aneurysm repair (02/1999); Knee arthroscopy; Cardiac defibrillator placement (2006; 2012); carotid dopplers (2006); hospitalization (2004); hospitalization; ABI (2006); left heart catheterization with coronary angiogram (N/A, 02/22/2012); Breast lumpectomy with radioactive seed localization (Right, 01/03/2015); Total knee arthroplasty (Left, 02/13/2016); Breast excisional biopsy; Tongue Biopsy (12/12/2017); Colonoscopy; Upper gastrointestinal endoscopy; Hammer toe surgery (11/15/2020); and Total knee arthroplasty (Right, 05/22/2021).  Brief Summary:  [name] is a 71 y.o. female former smoker with bronchiectasis and obstructive sleep apnea.  She had COVID pneumonia in October 2022.      Subjective:   Breathing better.  Not having sinus congestion.  Has sore throat.  Using pipe with nebulizer makes her cough.  Has wheezing on her Rt side last week.  Better now.  Not having much cough or sputum.  Using CPAP.  Feels this helps her sleep.  She is getting mouth dryness.  Mask is comfortable.  Physical Exam:   Appearance - well kempt   ENMT - no sinus tenderness, no oral exudate, no LAN, Mallampati 3 airway, no stridor, white exudate on  posterior pharynx  Respiratory - basilar crackles Rt > Lt  CV - s1s2 regular rate and rhythm, no murmurs  Ext - no clubbing, no edema  Skin - no rashes  Psych - normal mood and affect      [name] testing:  PFT 04/17/12 >> FEV1 2.60 (110%), FEV1% 85, TLC 4.05 (77%), DLCO 44% PFT 01/17/17 >> FEV1 2.38 (111%), FEV1% 90, TLC 3.85 (71%), DLCO 58%, no BD RAST 05/30/17 >> dust mites PFT 10/16/18 >> FEV1 2.27 (111%), FEV1% 90, TLC 4.00 (74%), DLCO 59%  Serology:  04/02/12 >> ACE 1 10/25/15 >> ESR 40, ACE 32 05/30/17 >> IgA, IgG, IgM normal 10/16/18 >> HP panel negative, ACE 25  Chest Imaging:  CT chest 04/10/12 >> b/l lower lobe cylindrical BTX and some in upper lobes CT angio chest 10/26/15 >> patchy GGO in periphery of lungs b/l, basilar BTX, no PE; no significant change compared to 2013 HRCT chest 10/08/18 >> atherosclerosis, basilar predominant cylindrical BTX, mild centrilobular/paraseptal emphysema, air trapping CT angio chest 03/24/19 >> Chronic lung changes with peribronchial thickening, bibasilar atelectasis and chronic interstitial disease/peripheral fibrosis at the lung bases CT angio chest 06/03/21 >> scattered GGO with fibrotic changes CT angio chest 08/30/21 >> BTX with emphysema and fibrotic changes in periphery  Sleep Tests:  PSG 11/29/08 >> AHI 9 HST 11/10/15 >> AHI 7.1, SaO2 low 75% CPAP 12/11/20 to 01/09/21 >> used on 25 of 30 nights with average 4 hrs 49 min.  Average AHI 2.2 with CPAP 6 cm H2O.  Cardiac Tests:  Echo 08/11/21 >> EF 50 to  55%, RVSP 39.5 mmHg  Social History:  She  reports that she quit smoking about 32 years ago. Her smoking use included cigarettes. She has a 15.00 pack-year smoking history. She has never used smokeless tobacco. She reports that she does not currently use alcohol after a past usage of about 3.0 standard drinks of alcohol per week. She reports that she does not currently use drugs after having used the following drugs: Marijuana.  Family  History:  Her family history includes Allergies in her sister; Alzheimer's disease in her mother; Breast cancer in her cousin and maternal aunt; Breast cancer (age of onset: 39) in her maternal aunt; Cancer (age of onset: 9) in her maternal grandmother; Colon cancer (age of onset: 29) in her maternal aunt; Diabetes in her mother; Heart disease in her brother, father, sister, and son; Hypertension in her mother; Lung cancer in her maternal uncle; Lung cancer (age of onset: 22) in her maternal grandfather; Obesity in her mother; Other in her brother; Pancreatic cancer (age of onset: 21) in her cousin; Prostate cancer (age of onset: 60) in her brother; Throat cancer in her brother.     Assessment/Plan:   History of COVID 19 pneumonia from October 2022. - she has changes of ILD on CT from prior COVID pneumonia, but has improvement on CT chest from January 2023 - will arrange for follow up HRCT chest and PFT for November 2023  Chronic respiratory failure with hypoxia. - 2 liters oxygen prn with exertion - reassess home oxygen need at next follow up  Bronchiectasis. - continue symbicort - will arrange for spacer device - prn albuterol, mucinex, flutter valve, hypertonic saline  Upper airway cough syndrome with allergic rhinitis. - continue singulair, allegra, flonase, Navage nasal irrigation   Obstructive sleep apnea. - she is compliant with CPAP and reports benefit - she uses [employer] for her DME - continue CPAP 5 cm H2O - advised her to increase humidifier setting on her CPAP - if she still has mask leak, then should need mask refitting  Thrush. - will give her a course of nystatin  Non-ischemic cardiomyopathy. - followed by Dr. Irish Lack and Dr. Caryl Comes with [facility]   Time Spent Involved in Patient Care on Day of Examination:  38 minutes  Follow up:   Patient Instructions  Nystatin 5 ml swish and swallow 4 times daily for 5 days to treat thrush  Will arrange for a  spacer device to use with your inhalers  Will arrange for a face mask to use with your home nebulizer machine  Will schedule [name] function test and high resolution CT chest from November 2023, and follow up in 4 months after these are done  Medication List:   Allergies as of 03/05/2022       Reactions   Ace Inhibitors Swelling   Angioedema; makes tongue "break out"   Amitriptyline Hcl Other (See Comments)    makes her too sleepy!   Atorvastatin Other (See Comments)   SEVERE MYALGIA   Cymbalta [duloxetine Hcl] Nausea Only, Other (See Comments)   Sleepiness/ sick   Dilantin [phenytoin Sodium Extended] Rash   Severe rash   Paroxetine Nausea Only   Rapid heartbeat   Ramipril Other (See Comments)   TONGUE ULCERS   Rosuvastatin Other (See Comments)   SEVERE MYALGIA   Carbamazepine Rash   Codeine Itching   Paxlovid [nirmatrelvir-ritonavir] Itching   Phenytoin Sodium Extended Rash   Simvastatin Other (See Comments)   Increase in CK, myalgias  Wellbutrin [bupropion] Palpitations        Medication List        Accurate as of March 05, 2022  4:06 PM. If you have any questions, ask your nurse or doctor.          acetaminophen 500 MG tablet Commonly known as: TYLENOL Take 500 mg by mouth every 6 (six) hours as needed for moderate pain.   albuterol (2.5 MG/3ML) 0.083% nebulizer solution Commonly known as: PROVENTIL USE 1 VIAL IN NEBULIZER EVERY 6 HOURS What changed: See the new instructions.   albuterol 108 (90 Base) MCG/ACT inhaler Commonly known as: Ventolin HFA Inhale 2 puffs into the lungs every 6 (six) hours as needed for wheezing or shortness of breath. What changed: Another medication with the same name was changed. Make sure you understand how and when to take each.   aspirin EC 81 MG tablet Take 81 mg by mouth daily. Swallow whole.   azelastine 0.1 % nasal spray Commonly known as: ASTELIN Place 1 spray into both nostrils 2 (two) times daily. Use in  each nostril as directed   baclofen 10 MG tablet Commonly known as: LIORESAL Take 1 tablet (10 mg total) by mouth at bedtime as needed for muscle spasms.   benzonatate 100 MG capsule Commonly known as: TESSALON Take 1 capsule (100 mg total) by mouth 3 (three) times daily as needed for cough.   budesonide-formoterol 80-4.5 MCG/ACT inhaler Commonly known as: SYMBICORT INHALE 2 PUFFS TWICE DAILY IN THE MORNING AND AT BEDTIME   cetirizine 10 MG tablet Commonly known as: ZYRTEC Take 10 mg by mouth daily as needed for allergies.   Dialyvite Vitamin D 5000 125 MCG (5000 UT) capsule Generic drug: Cholecalciferol Take 5,000 Units by mouth daily.   diclofenac Sodium 1 % Gel Commonly known as: VOLTAREN Apply 2-4 grams to affected joint 4 times daily as needed. What changed:  how much to take how to take this when to take this reasons to take this additional instructions   diltiazem 120 MG 24 hr capsule Commonly known as: Cardizem CD Take 1 capsule (120 mg total) by mouth daily. Do Not start this medication until after you wean off Carvedilol.   ezetimibe 10 MG tablet Commonly known as: ZETIA Take 1 tablet (10 mg total) by mouth daily.   famotidine 20 MG tablet Commonly known as: PEPCID Take 1 tablet (20 mg total) by mouth at bedtime.   famotidine 40 MG tablet Commonly known as: Pepcid Take 1 tablet (40 mg total) by mouth at bedtime.   fexofenadine 180 MG tablet Commonly known as: ALLEGRA Take 180 mg by mouth daily as needed for allergies (alternates with zyrtec sometimes).   fluticasone 50 MCG/ACT nasal spray Commonly known as: FLONASE Place 2 sprays into both nostrils daily as needed for allergies or rhinitis.   Flutter Devi 1 Device by Does not apply route as needed.   FOLIC ACID PO Take 1 tablet by mouth daily.   furosemide 20 MG tablet Commonly known as: LASIX Take 40 mg (2 tabs) 3 days per week. Take 20 mg the other days of the week.   hydroxypropyl  methylcellulose / hypromellose 2.5 % ophthalmic solution Commonly known as: ISOPTO TEARS / GONIOVISC Place 1 drop into both eyes at bedtime.   latanoprost 0.005 % ophthalmic solution Commonly known as: XALATAN Place 1 drop into both eyes at bedtime.   levothyroxine 50 MCG tablet Commonly known as: SYNTHROID TAKE 1 TABLET EVERY DAY   magnesium oxide  400 MG tablet Commonly known as: MAG-OX Take 1 tablet (400 mg total) by mouth 2 (two) times daily.   methocarbamol 500 MG tablet Commonly known as: ROBAXIN Take 1-2 tablets (500-1,000 mg total) by mouth every 6 (six) hours as needed for muscle spasms.   mometasone 0.1 % lotion Commonly known as: ELOCON Apply 1 application topically daily as needed (rash).   montelukast 10 MG tablet Commonly known as: SINGULAIR TAKE 1 TABLET AT BEDTIME   MULTIVITAMIN PO Take 1 tablet by mouth daily.   nitroGLYCERIN 0.4 MG SL tablet Commonly known as: NITROSTAT DISSOLVE ONE TABLET UNDER THE TONGUE EVERY 5 MINUTES AS NEEDED FOR CHEST PAIN.  DO NOT EXCEED A TOTAL OF 3 DOSES IN 15 MINUTES What changed: See the new instructions.   nystatin 100000 UNIT/ML suspension Commonly known as: MYCOSTATIN Take 5 mLs (500,000 Units total) by mouth 4 (four) times daily. Started by: Chesley Mires, MD   pantoprazole 40 MG tablet Commonly known as: PROTONIX Take 1 tablet (40 mg total) by mouth daily.   polyethylene glycol powder 17 GM/SCOOP powder Commonly known as: GLYCOLAX/MIRALAX Take 17 grams by mouth daily   potassium chloride 10 MEQ tablet Commonly known as: KLOR-CON M TAKE 1/2 TABLET EVERY DAY (CALL OFFICE TO SCHEDULE YEARLY APPT)   promethazine-dextromethorphan 6.25-15 MG/5ML syrup Commonly known as: PROMETHAZINE-DM Take 5 mLs by mouth 4 (four) times daily as needed for cough.   sodium chloride HYPERTONIC 3 % nebulizer solution Take by nebulization 2 (two) times daily as needed for cough. 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   sodium chloride HYPERTONIC 3 %  nebulizer solution Take by nebulization in the morning and at bedtime.   traMADol 50 MG tablet Commonly known as: ULTRAM Take 1 tablet (50 mg total) by mouth every 12 (twelve) hours as needed.   traZODone 100 MG tablet Commonly known as: DESYREL Take 1-1.5 tablets (100-150 mg total) by mouth at bedtime.   vitamin C 1000 MG tablet Take 1,000 mg by mouth daily.        Signature:  Chesley Mires, MD [place] Pager - [phone] 03/05/2022, 4:06 PM

## 2022-03-08 ENCOUNTER — Encounter: Payer: Self-pay | Admitting: Physical Therapy

## 2022-03-08 ENCOUNTER — Encounter: Payer: Medicare Other | Admitting: Physical Therapy

## 2022-03-08 DIAGNOSIS — M6281 Muscle weakness (generalized): Secondary | ICD-10-CM

## 2022-03-08 DIAGNOSIS — R278 Other lack of coordination: Secondary | ICD-10-CM

## 2022-03-08 DIAGNOSIS — N3281 Overactive bladder: Secondary | ICD-10-CM | POA: Diagnosis not present

## 2022-03-08 DIAGNOSIS — R3915 Urgency of urination: Secondary | ICD-10-CM

## 2022-03-08 NOTE — Therapy (Signed)
OUTPATIENT PHYSICAL THERAPY TREATMENT NOTE   Patient Name: [name] MRN: [id number] DOB:12-27-1950, 71 y.o., female Today's Date: 03/08/2022  PCP: Durene Fruits, NP REFERRING PROVIDER: Sherlene Shams, MD  END OF SESSION:   PT End of Session - 03/08/22 1033     Visit Number 5    Date for PT Re-Evaluation 04/03/22    Authorization Type Medicare    Authorization - Visit Number 5    Authorization - Number of Visits 10    PT Start Time 3419    PT Stop Time 1115    PT Time Calculation (min) 45 min    Activity Tolerance Patient tolerated treatment well    Behavior During Therapy WFL for tasks assessed/performed             Past Medical History:  Diagnosis Date   AICD (automatic cardioverter/defibrillator) present    Anxiety    Arthritis    Back pain    Breast cancer ([place]) 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   Bronchiectasis ([place])    Cerebral aneurysm, nonruptured    had a clip put in   CHF (congestive heart failure) ([place])    Clostridium difficile infection    Depressive disorder, not elsewhere classified    Diverticulosis of colon (without mention of hemorrhage)    Esophageal reflux    Fatty liver    Fibromyalgia    Gastritis    GERD (gastroesophageal reflux disease)    GI bleed 2004   Glaucoma    Hiatal hernia    History of COVID-19 05/04/2021   Hyperlipidemia    Hypertension    Hypothyroidism    Internal hemorrhoids    Joint pain    Multifocal pneumonia 06/03/2021   Obstructive sleep apnea (adult) (pediatric)    Osteoarthritis    Ostium secundum type atrial septal defect    Other chronic nonalcoholic liver disease    Other pulmonary embolism and infarction    Palpitations    Paroxysmal ventricular tachycardia ([place])    Personal history of radiation therapy    PONV (postoperative nausea and vomiting)    Presence of permanent cardiac pacemaker    PUD (peptic ulcer disease)    Radiation 02/03/15-03/10/15   Right Breast   Sarcoid     per pt , not sure   Schatzki's ring    Shortness of breath    Sleep apnea    Stroke [facility]) 2013   tia/ pt feels it was around 2008 0r 2009   Takotsubo syndrome    Tubular adenoma of colon    Unspecified transient cerebral ischemia    Unspecified vitamin D deficiency    Past Surgical History:  Procedure Laterality Date   ABI  2006   normal   BREAST EXCISIONAL BIOPSY     BREAST LUMPECTOMY WITH RADIOACTIVE SEED LOCALIZATION Right 01/03/2015   Procedure: BREAST LUMPECTOMY WITH RADIOACTIVE SEED LOCALIZATION;  Surgeon: Autumn Messing III, MD;  Location: [place];  Service: General;  Laterality: Right;   CARDIAC DEFIBRILLATOR PLACEMENT  2006; 2012   BSX single chamber ICD   carotid dopplers  2006   neg   CEREBRAL ANEURYSM REPAIR  02/1999   COLONOSCOPY     HAMMER TOE SURGERY  11/15/2020   3rd digit bilateral feet    hospitalization  2004   GI bleed, PUD, diverticulosis (EGD,colonscopy)   hospitalization     PE, NSVT, s/p defib   KNEE ARTHROSCOPY     bilateral   LEFT HEART CATHETERIZATION  WITH CORONARY ANGIOGRAM N/A 02/22/2012   Procedure: LEFT HEART CATHETERIZATION WITH CORONARY ANGIOGRAM;  Surgeon: Burnell Blanks, MD;  Location: [facility] CATH LAB;  Service: Cardiovascular;  Laterality: N/A;   PARTIAL HYSTERECTOMY     Fibroids   TONGUE BIOPSY  12/12/2017   due to sore tongue and white patches/abnormal cells   TOTAL KNEE ARTHROPLASTY Left 02/13/2016   Procedure: TOTAL KNEE ARTHROPLASTY;  Surgeon: Vickey Huger, MD;  Location: [place];  Service: Orthopedics;  Laterality: Left;   TOTAL KNEE ARTHROPLASTY Right 05/22/2021   Procedure: TOTAL KNEE ARTHROPLASTY;  Surgeon: Vickey Huger, MD;  Location: [facility];  Service: Orthopedics;  Laterality: Right;  with block   UPPER GASTROINTESTINAL ENDOSCOPY     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   Localized swelling of right  lower leg 08/29/2021   Tonsillar exudate 08/29/2021   Candida vaginitis 08/28/2021   Chronic pain syndrome 08/25/2021   Multifocal pneumonia 06/03/2021   Pre-operative respiratory examination 05/18/2021   Statin myopathy 04/19/2021   Pain in joint of right shoulder 02/10/2021   Arthritis of both acromioclavicular joints 01/02/2021   Trochanteric bursitis of right hip 10/03/2020   Tailbone injury 05/05/2020   UTI (urinary tract infection) 05/05/2020   Insomnia 05/05/2020   Leg cramps 02/16/2020   Dysuria 02/16/2020   Right hip impingement syndrome 02/01/2020   Right bicipital tenosynovitis 02/01/2020   Bronchiectasis without complication ([place]) 45/10/8880   Myofascial pain 12/21/2019   Estrogen deficiency 12/10/2019   Obesity (BMI 30-39.9) 12/10/2019   Bronchiectasis with (acute) exacerbation ([place]) 10/23/2019   Elevated LFTs 10/15/2019   Lower abdominal pain 09/01/2019   Neck pain 10/25/2017   Paresthesias in left hand 10/25/2017   Paresthesia of foot, bilateral 04/12/2017   Primary osteoarthritis of both hands 09/27/2016   Primary osteoarthritis of both knees 09/27/2016   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   Nasopharyngitis, 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/2016   S/P total knee replacement 02/13/2016   Chronic pain of both knees 12/06/2015   Globus pharyngeus 05/17/2015   Genetic testing 12/23/2014   Family history of breast cancer    Family history of colon cancer    Family history of pancreatic cancer    History of breast cancer 11/23/2014   Allergy to ACE inhibitors 09/27/2014   Prediabetes 06/23/2013   RLS (restless legs syndrome) 01/15/2013   Hx of Clostridium difficile infection 10/10/2012   Dyspnea on exertion 04/02/2012   NICM (nonischemic cardiomyopathy) ([place]) 03/07/2012   Post-menopausal 01/11/2012   Atypical chest pain 11/07/2011   Obstructive sleep apnea 04/04/2010   V-tach ([place]) 01/03/2009   ICD  [employer]  Single chamber 01/03/2009   PFO (patent foramen  ovale) 09/29/2008   Vitamin D deficiency 08/17/2008   Hypothyroidism 11/18/2006   Hyperlipidemia 11/18/2006   Depression with anxiety 11/18/2006   Essential hypertension 11/18/2006   Mitral valve prolapse 11/18/2006   Cerebral aneurysm 11/18/2006   Allergic rhinitis 11/18/2006   GERD 11/18/2006   Diverticulosis of colon 11/18/2006   Fatty liver 11/18/2006   Fibromyalgia 11/18/2006   REFERRING DIAG:  N32.81 Overactive Bladder   THERAPY DIAG:  Muscle weakness (generalized)   Other lack of coordination   Urinary urgency   Rationale for Evaluation and Treatment Rehabilitation   PERTINENT HISTORY: Partial hysterectomy; breast cancer 2016; Glaucoma; Hypothyroidism; Fibromyalgia; CHF; Cerebral Aneurysm repair 02/1999; TKR bil.    PRECAUTIONS:  ICD/Pacemaker  SUBJECTIVE: I have some urgency. Urgency 40% better. I am glad today is my last day. Go to the bathroom every 2 hours. Patient changes her pad 3 times per day due to feeling moist. The pad smells like urine. I am happy with my progress. I feel stronger. Patient is able to fully empty her rectum. Fully empty bladder now.    PAIN:  Are you having pain? No     OBJECTIVE: (objective measures completed at initial evaluation unless otherwise dated)   PATIENT GOALS reducing the urgency and urinary leakage   BOWEL MOVEMENT Pain with bowel movement: No Type of bowel movement:Type (Bristol Stool Scale) Type 4 and if not taking her Miralax is Type 3, Frequency 1-3 days, and Strain No Fully empty rectum: yes Fiber supplement: Yes: Miralax   URINATION Pain with urination: No Fully empty bladder: yes Stream:  not as strong as it use to be and in a stream Urgency: Yes: when gets home, night times  and will pee on her leg; she has put a portable toilet by her bed to help Frequency: every 2 hours Leakage: Urge to void, Walking to the bathroom, Coughing, Sneezing, and Bending forward Pads: Yes: 2 poise pads per day PATIENT SURVEYS:   PFIQ-7 UIQ-7 19   COGNITION:            Overall cognitive status: Within functional limits for tasks assessed                              LUMBARAROM/PROM   A/PROM A/PROM  eval A/ROM 03/07/2022  Flexion Tightness in the lumbar region full  Extension Decreased by 25% full  Right lateral flexion Decreased by 25% full  Left lateral flexion Decreased by 25% full   (Blank rows = not tested)   LOWER EXTREMITY ROM:   Passive ROM Right eval Left eval Right 03/08/2022 Left  03/08/2022  Hip external rotation 40 40 40 40   (Blank rows = not tested)   LOWER EXTREMITY MMT:   MMT Right eval Left eval Right 03/08/2022 Left 03/08/2022  Hip flexion 4/5 4/5 4/5 4/5  Hip extension 3/5 3/5 4/5 4/5  Hip abduction 3/5 3/5 4/5 4/5  Hip adduction 4/5 4/5 5/5 5/5  Hip internal rotation 4/5 4/5 4/5 4/5  Hip external rotation 4/5 4/5 4/5 4/5    PELVIC MMT:   MMT eval  Vaginal 2/5  (Blank rows = not tested)         TONE: average   PROLAPSE: Anterior wall came downward slightly   TODAY'S TREATMENT  03/08/2022 Neuromuscular re-education: Pelvic floor contraction training:sitting ball squeeze with pelvic floor contraction 10x hold 5 sec     Sitting hip abduction with resistance using the red band and pelvic floor contraction 15x     Sitting hip marching with pelvic floor contraction 20x Self-care: Discussed with patient on laying down in the middle of the day with legs elevated to reduce the swelling in her legs and the amount of times she has to go to the bathroom at night    01/30/2022 Manual: Internal pelvic floor techniques: urogenital diaphragm releas through the different restrictions No emotional/communication barriers or cognitive limitation. Patient is motivated to learn. Patient understands and agrees with treatment goals and plan. PT explains patient will be examined in standing, sitting, and lying down to see how their muscles and joints work. When they are ready, they will  be asked to remove  their underwear so PT can examine their perineum. The patient is also given the option of providing their own chaperone as one is not provided in our facility. The patient also has the right and is explained the right to defer or refuse any part of the evaluation or treatment including the internal exam. With the patient's consent, PT will use one gloved finger to gently assess the muscles of the pelvic floor, seeing how well it contracts and relaxes and if there is muscle symmetry. After, the patient will get dressed and PT and patient will discuss exam findings and plan of care. PT and patient discuss plan of care, schedule, attendance policy and HEP activities. : Neuromuscular re-education: Pelvic floor contraction training:pelvic floor contraction with therapist placing her hands on the urogenital diaphragm to assist the pelvic floor to contract and lift.    Exercises: Stretches/mobility:sitting hamstring stretch hold 30 sec bil.                                      Piriformis stretch in sitting holding for 30 sec bil.                                      Happy baby stretch                                     Standing lumbar extension  Self-care: Discussed with patient the side effects of Jardiance and it is yeast infections. She will contact Dr. Wannetta Sender. She is using the V-magic cream but still has itching vaginally.  Educated patient on seeing     01/25/2022 Manual: Soft tissue mobilization: Manual work to the lower abdomen to work on tissue release Scar tissue mobilization: to the c-section scar improving mobility and educated patient on how to perform the scar massage at home Exercises: Strengthening: abdominal contraction with ball squeeze with tactile cues and verbal cues Self-care:  Reviewed with patient on vaginal moisturizers  and washes that are safe for the vaginal area.      PATIENT EDUCATION: 03/08/2022 Education details:  Access Code: [email or web address], Person  educated: Patient Education method: Explanation, Demonstration,  and Handouts Education comprehension: verbalized understanding, returned demonstration   HOME EXERCISE PROGRAM: 03/08/2022 Access Code: [email or web address] URL: [email or web address] Date: 03/08/2022 Prepared by: Earlie Counts  Exercises - Supine Figure 4 Piriformis Stretch  - 1 x daily - 7 x weekly - 1 sets - 2 reps - 15 sec hold - Hooklying Single Knee to Chest Stretch  - 1 x daily - 7 x weekly - 1 sets - 2 reps - 30 sec hold - Supine Lower Trunk Rotation  - 1 x daily - 7 x weekly - 1 sets - 2 reps - 30 sec hold - Standing Lumbar Extension  - 1 x daily - 7 x weekly - 1 sets - 5 reps - Seated Hamstring Stretch  - 1 x daily - 7 x weekly - 1 sets - 2 reps - 30 sec hold - Seated Piriformis Stretch  - 1 x daily - 7 x weekly - 1 sets - 2 reps - 30 sec hold - Seated Happy Baby With Trunk Flexion For Pelvic Relaxation  - 1 x daily - 7 x weekly - 1 sets -  2 reps - 30 sec hold - Hooklying Clamshell with Resistance  - 1 x daily - 3 x weekly - 1 sets - 10 reps - Supine Bridge with Mini Swiss Ball Between Knees  - 1 x daily - 3 x weekly - 1 sets - 10 reps - Supine Hip Adduction Isometric with Ball  - 1 x daily - 3 x weekly - 1 sets - 10 reps - 5 sec hold - Seated Pelvic Floor Contraction  - 3 x daily - 7 x weekly - 3 sets - 5 reps - 5-10 sec hold - Seated March  - 1 x daily - 3 x weekly - 2 sets - 10 reps - Seated Hip Abduction with Resistance  - 1 x daily - 3 x weekly - 2 sets - 10 reps - Seated Hip Adduction Squeeze with Ball  - 1 x daily - 3 x weekly - 1 sets - 10 reps - hold 5 sec hold     ASSESSMENT:   CLINICAL IMPRESSION: Patient is a 71 y.o. female who was seen today for physical therapy  treatment for overactive bladder. Patient reports less urgency but continues to have the leakage. Patient has a area above the clitoris  that is open and sore. Patient has learned exercises she can do in sitting so she can do them at work.  Patient was able to contract the pelvic floor and lift the muscles upward for the first time. Patient feels her urgency is less. Patient will benefit from skilled therapy to improve continence, pelvic floor coordination and strength.      OBJECTIVE IMPAIRMENTS decreased activity tolerance, decreased coordination, decreased endurance, decreased strength, and increased fascial restrictions.    ACTIVITY LIMITATIONS continence   PARTICIPATION LIMITATIONS: community activity   PERSONAL FACTORS Fitness and 3+ comorbidities: Pacemaker, partial hysterectomy; H/o breast cancer, stroke 2013, Fibromyalgia; CHF  are also affecting patient's functional outcome.    REHAB POTENTIAL: Good   CLINICAL DECISION MAKING: Stable/uncomplicated   EVALUATION COMPLEXITY: Low     GOALS: Goals reviewed with patient? Yes   SHORT TERM GOALS: Target date: 02/06/2022   Patient educated on vaginal moisturizers to reduce her dryness and irritation to the vaginal tissue Baseline: Goal status: Met 01/30/2022  2.  Patient understands the importance of drinking more water to reduce the urge to urinate.  Baseline:  Goal status: Met 01/30/2022   3.  Patient able to consistently contract the pelvic floor without bulging the perineum.  Baseline:  Goal status: met 01/30/2022   4.  Patient understands how to mobilize her hysterectomy scar to reduce fascial restrictions.  Baseline:  Goal status: Met 03/08/2022     LONG TERM GOALS: Target date: 04/03/2022    Patient is independent with advanced HEP for pelvic floor, hips and core to reduce her leakage.  Baseline:  Goal status: Met 03/08/2022  2.  Patient reports her vaginal dryness and irritation decreased >/= 75% due to improve vaginal health Baseline:  Goal status: Met 03/08/2022   3.  Patient is able to walk to the bathroom with minimal to no urinary leakage due to improved pelvic floor coordination and strength.  Baseline: minimal leakage Goal status: Met  03/08/2022   4.  Patient reports her urinary leakage with coughing and sneezing decreased >/= 75% due to increased in pelvic floor strength >/= 3/5.  Baseline:  Goal status: Met  5.  Patient reports her urinary urgency decreased >/= 75% due to improved pelvic floor coordination.  Baseline: reduced  by 40% Goal status: Met 03/08/2022      PLAN: PT FREQUENCY: 1x/week   PT DURATION: 12 weeks   PLANNED INTERVENTIONS: Therapeutic exercises, Therapeutic activity, Neuromuscular re-education, Patient/Family education, Joint mobilization, Dry Needling, Moist heat, and Manual therapy   PLAN FOR NEXT SESSION: Discharge to Theresa, PT 03/08/22 11:19 AM   PHYSICAL THERAPY DISCHARGE SUMMARY  Visits from Start of Care: 5  Current functional level related to goals / functional outcomes: See above.    Remaining deficits: See above.    Education / Equipment: HEP   Patient agrees to discharge. Patient goals were partially met. Patient is being discharged due to being pleased with the current functional level. Thank you for the referral. Earlie Counts, PT 03/08/22 11:20 AM

## 2022-03-13 ENCOUNTER — Other Ambulatory Visit: Payer: Self-pay | Admitting: Physical Medicine and Rehabilitation

## 2022-03-15 ENCOUNTER — Ambulatory Visit: Payer: Self-pay | Admitting: *Deleted

## 2022-03-15 NOTE — Telephone Encounter (Signed)
  Chief Complaint: bilateral feet/ankle/lower leg pain Symptoms: cramping in bilateral feet,ankles, lower legs, new cramping in L hand Frequency: at least 1 month- hand-1 week- in morning Pertinent Negatives: Patient denies chest pain, back pain, breathing difficulty, swelling, rash, fever, numbness, weakness Disposition: '[]'$ ED /'[]'$ Urgent Care (no appt availability in office) / '[x]'$ Appointment(In office/virtual)/ '[]'$  [facility]/ '[]'$ Home Care/ '[]'$ Refused Recommended Disposition /'[]'$ [facility]/ '[]'$  Follow-up with PCP Additional Notes: Patient scheduled first available appointment- out of disposition- patient placed on cancellation list- options of mobile unit/UC advised  Reason for Disposition  [1] MODERATE pain (e.g., interferes with normal activities, limping) AND [2] present > 3 days  Answer Assessment - Initial Assessment Questions 1. ONSET: "When did the pain start?"      Off/on 1 month- every am- lasts 5-10 minutes 2. LOCATION: "Where is the pain located?"      Both legs- feet, ankles, lower legs 3. PAIN: "How bad is the pain?"    (Scale 1-10; or mild, moderate, severe)   -  MILD (1-3): doesn't interfere with normal activities    -  MODERATE (4-7): interferes with normal activities (e.g., work or school) or awakens from sleep, limping    -  SEVERE (8-10): excruciating pain, unable to do any normal activities, unable to walk     Severe- unable to walk with occurrence 4. WORK OR EXERCISE: "Has there been any recent work or exercise that involved this part of the body?"      no 5. CAUSE: "What do you think is causing the leg pain?"     Unsure- did have to stop statin for cramping in the past 6. OTHER SYMPTOMS: "Do you have any other symptoms?" (e.g., chest pain, back pain, breathing difficulty, swelling, rash, fever, numbness, weakness)     Cramping in hands 7. PREGNANCY: "Is there any chance you are pregnant?" "When was your last menstrual period?"  Answer Assessment -  Initial Assessment Questions 1. ONSET: "When did the pain start?"     Last week- off/on, different time from leg cramping- this is new 2. LOCATION: "Where is the pain located?"     L hand 3. PAIN: "How bad is the pain?" (Scale 1-10; or mild, moderate, severe)   - MILD (1-3): doesn't interfere with normal activities   - MODERATE (4-7): interferes with normal activities (e.g., work or school) or awakens from sleep   - SEVERE (8-10): excruciating pain, unable to use hand at all     moderate 4. WORK OR EXERCISE: "Has there been any recent work or exercise that involved this part (i.e., hand or wrist) of the body?"     no 5. CAUSE: "What do you think is causing the pain?"     Unsure 6. AGGRAVATING FACTORS: "What makes the pain worse?" (e.g., using computer)     Only occurred once 7. OTHER SYMPTOMS: "Do you have any other symptoms?" (e.g., neck pain, swelling, rash, numbness, fever)     no 8. PREGNANCY: "Is there any chance you are pregnant?" "When was your last menstrual period?"     *No Answer*  Protocols used: Leg Pain-A-AH, Hand and Wrist Pain-A-AH

## 2022-03-16 NOTE — Progress Notes (Signed)
Patient ID: [name], female    DOB: [birth year]  MRN: [id number]  CC: Spasms   Subjective: [name] is a 71 y.o. female who presents for spasms.   Her concerns today include:  03/15/2022 per triage RN note: Chief Complaint: bilateral feet/ankle/lower leg pain Symptoms: cramping in bilateral feet,ankles, lower legs, new cramping in L hand Frequency: at least 1 month- hand-1 week- in morning Pertinent Negatives: Patient denies chest pain, back pain, breathing difficulty, swelling, rash, fever, numbness, weakness Disposition: '[]'$ ED /'[]'$ Urgent Care (no appt availability in office) / '[x]'$ Appointment(In office/virtual)/ '[]'$  [facility]/ '[]'$ Home Care/ '[]'$ Refused Recommended Disposition /'[]'$ [facility]/ '[]'$  Follow-up with PCP Additional Notes: Patient scheduled first available appointment- out of disposition- patient placed on cancellation list- options of mobile unit/UC advised  Reason for Disposition  [1] MODERATE pain (e.g., interferes with normal activities, limping) AND [2] present > 3 days  Answer Assessment - Initial Assessment Questions 1. ONSET: "When did the pain start?"      Off/on 1 month- every am- lasts 5-10 minutes 2. LOCATION: "Where is the pain located?"      Both legs- feet, ankles, lower legs 3. PAIN: "How bad is the pain?"    (Scale 1-10; or mild, moderate, severe)   -  MILD (1-3): doesn't interfere with normal activities    -  MODERATE (4-7): interferes with normal activities (e.g., work or school) or awakens from sleep, limping    -  SEVERE (8-10): excruciating pain, unable to do any normal activities, unable to walk     Severe- unable to walk with occurrence 4. WORK OR EXERCISE: "Has there been any recent work or exercise that involved this part of the body?"      no 5. CAUSE: "What do you think is causing the leg pain?"     Unsure- did have to stop statin for cramping in the past 6. OTHER SYMPTOMS: "Do you have any other symptoms?"  (e.g., chest pain, back pain, breathing difficulty, swelling, rash, fever, numbness, weakness)     Cramping in hands 7. PREGNANCY: "Is there any chance you are pregnant?" "When was your last menstrual period?"  Answer Assessment - Initial Assessment Questions 1. ONSET: "When did the pain start?"     Last week- off/on, different time from leg cramping- this is new 2. LOCATION: "Where is the pain located?"     L hand 3. PAIN: "How bad is the pain?" (Scale 1-10; or mild, moderate, severe)   - MILD (1-3): doesn't interfere with normal activities   - MODERATE (4-7): interferes with normal activities (e.g., work or school) or awakens from sleep   - SEVERE (8-10): excruciating pain, unable to use hand at all     moderate 4. WORK OR EXERCISE: "Has there been any recent work or exercise that involved this part (i.e., hand or wrist) of the body?"     no 5. CAUSE: "What do you think is causing the pain?"     Unsure 6. AGGRAVATING FACTORS: "What makes the pain worse?" (e.g., using computer)     Only occurred once 7. OTHER SYMPTOMS: "Do you have any other symptoms?" (e.g., neck pain, swelling, rash, numbness, fever)     no 8. PREGNANCY: "Is there any chance you are pregnant?" "When was your last menstrual period?"     *No Answer*  Today's visit 03/23/2022: Muscle spasms persisting. Happens upon awakening in the morning. Has tried Gatorade, Body Armour drinks, and mustard to help without relief.  No further issues/concerns.   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   Localized swelling of right lower leg 08/29/2021   Tonsillar exudate 08/29/2021   Candida vaginitis 08/28/2021   Chronic pain syndrome 08/25/2021   Multifocal pneumonia 06/03/2021   Pre-operative respiratory examination 05/18/2021   Statin myopathy 04/19/2021   Pain in joint of right shoulder 02/10/2021    Arthritis of both acromioclavicular joints 01/02/2021   Trochanteric bursitis of right hip 10/03/2020   Tailbone injury 05/05/2020   UTI (urinary tract infection) 05/05/2020   Insomnia 05/05/2020   Leg cramps 02/16/2020   Dysuria 02/16/2020   Right hip impingement syndrome 02/01/2020   Right bicipital tenosynovitis 02/01/2020   Bronchiectasis without complication ([place]) 05/39/7673   Myofascial pain 12/21/2019   Estrogen deficiency 12/10/2019   Obesity (BMI 30-39.9) 12/10/2019   Bronchiectasis with (acute) exacerbation ([place]) 10/23/2019   Elevated LFTs 10/15/2019   Lower abdominal pain 09/01/2019   Neck pain 10/25/2017   Paresthesias in left hand 10/25/2017   Paresthesia of foot, bilateral 04/12/2017   Primary osteoarthritis of both hands 09/27/2016   Primary osteoarthritis of both knees 09/27/2016   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   Nasopharyngitis, 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/2016   S/P total knee replacement 02/13/2016   Chronic pain of both knees 12/06/2015   Globus pharyngeus 05/17/2015   Genetic testing 12/23/2014   Family history of breast cancer    Family history of colon cancer    Family history of pancreatic cancer    History of breast cancer 11/23/2014   Allergy to ACE inhibitors 09/27/2014   Prediabetes 06/23/2013   RLS (restless legs syndrome) 01/15/2013   Hx of Clostridium difficile infection 10/10/2012   Dyspnea on exertion 04/02/2012   NICM (nonischemic cardiomyopathy) ([place]) 03/07/2012   Post-menopausal 01/11/2012   Atypical chest pain 11/07/2011   Obstructive sleep apnea 04/04/2010   V-tach ([place]) 01/03/2009   ICD  [employer]  Single chamber 01/03/2009   PFO (patent foramen ovale) 09/29/2008   Vitamin D deficiency 08/17/2008   Hypothyroidism 11/18/2006   Hyperlipidemia 11/18/2006   Depression with anxiety 11/18/2006   Essential hypertension 11/18/2006   Mitral valve prolapse 11/18/2006   Cerebral aneurysm 11/18/2006   Allergic rhinitis 11/18/2006    GERD 11/18/2006   Diverticulosis of colon 11/18/2006   Fatty liver 11/18/2006   Fibromyalgia 11/18/2006     Current Outpatient Medications on File Prior to Visit  Medication Sig Dispense Refill   acetaminophen (TYLENOL) 500 MG tablet Take 500 mg by mouth every 6 (six) hours as needed for moderate pain.     albuterol (PROVENTIL) (2.5 MG/3ML) 0.083% nebulizer solution USE 1 VIAL IN NEBULIZER EVERY 6 HOURS (Patient taking differently: Take 2.5 mg by nebulization every 6 (six) hours as needed for shortness of breath.) 120 mL 11   albuterol (VENTOLIN HFA) 108 (90 Base) MCG/ACT inhaler Inhale 2 puffs into the lungs every 6 (six) hours as needed for wheezing or shortness of breath. 18 g 3   Ascorbic Acid (VITAMIN C) 1000 MG tablet Take 1,000 mg by mouth daily.     aspirin EC 81 MG tablet Take 81 mg by mouth daily. Swallow whole.     azelastine (ASTELIN) 0.1 % nasal spray Place 1 spray into both nostrils 2 (two) times daily. Use in each nostril as directed 90 mL 3   baclofen (LIORESAL) 10 MG tablet Take 1  tablet (10 mg total) by mouth at bedtime as needed for muscle spasms. 90 tablet 1   budesonide-formoterol (SYMBICORT) 80-4.5 MCG/ACT inhaler INHALE 2 PUFFS TWICE DAILY IN THE MORNING AND AT BEDTIME 3 each 6   cetirizine (ZYRTEC) 10 MG tablet Take 10 mg by mouth daily as needed for allergies.     Cholecalciferol (DIALYVITE VITAMIN D 5000) 125 MCG (5000 UT) capsule Take 5,000 Units by mouth daily.     diclofenac Sodium (VOLTAREN) 1 % GEL Apply 2-4 grams to affected joint 4 times daily as needed. (Patient taking differently: Apply 2-4 g topically 4 (four) times daily as needed (joint pain).) 400 g 2   diltiazem (CARDIZEM CD) 120 MG 24 hr capsule Take 1 capsule (120 mg total) by mouth daily. Do Not start this medication until after you wean off Carvedilol. 90 capsule 1   Empagliflozin (JARDIANCE PO) Take by mouth.     ezetimibe (ZETIA) 10 MG tablet Take 1 tablet (10 mg total) by mouth daily. 90 tablet 3    famotidine (PEPCID) 40 MG tablet Take 1 tablet (40 mg total) by mouth at bedtime. 360 tablet 6   fexofenadine (ALLEGRA) 180 MG tablet Take 180 mg by mouth daily as needed for allergies (alternates with zyrtec sometimes).     fluticasone (FLONASE) 50 MCG/ACT nasal spray Place 2 sprays into both nostrils daily as needed for allergies or rhinitis. 48 g 3   FOLIC ACID PO Take 1 tablet by mouth daily.     furosemide (LASIX) 20 MG tablet Take 40 mg (2 tabs) 3 days per week. Take 20 mg the other days of the week. 90 tablet 3   hydroxypropyl methylcellulose / hypromellose (ISOPTO TEARS / GONIOVISC) 2.5 % ophthalmic solution Place 1 drop into both eyes at bedtime.     latanoprost (XALATAN) 0.005 % ophthalmic solution Place 1 drop into both eyes at bedtime.     levothyroxine (SYNTHROID) 50 MCG tablet TAKE 1 TABLET EVERY DAY 90 tablet 1   magnesium oxide (MAG-OX) 400 MG tablet Take 1 tablet (400 mg total) by mouth 2 (two) times daily. 180 tablet 3   mometasone (ELOCON) 0.1 % lotion Apply 1 application topically daily as needed (rash). 60 mL 1   montelukast (SINGULAIR) 10 MG tablet TAKE 1 TABLET AT BEDTIME 90 tablet 3   Multiple Vitamins-Minerals (MULTIVITAMIN PO) Take 1 tablet by mouth daily.     nitroGLYCERIN (NITROSTAT) 0.4 MG SL tablet DISSOLVE ONE TABLET UNDER THE TONGUE EVERY 5 MINUTES AS NEEDED FOR CHEST PAIN.  DO NOT EXCEED A TOTAL OF 3 DOSES IN 15 MINUTES (Patient taking differently: Place 0.4 mg under the tongue every 5 (five) minutes as needed for chest pain.) 25 tablet 0   nystatin (MYCOSTATIN) 100000 UNIT/ML suspension Take 5 mLs (500,000 Units total) by mouth 4 (four) times daily. 60 mL 0   pantoprazole (PROTONIX) 40 MG tablet Take 1 tablet (40 mg total) by mouth daily. 90 tablet 3   polyethylene glycol powder (GLYCOLAX/MIRALAX) powder Take 17 grams by mouth daily 1080 g 0   potassium chloride (KLOR-CON M) 10 MEQ tablet TAKE 1/2 TABLET EVERY DAY (CALL OFFICE TO SCHEDULE YEARLY APPT) 30 tablet 11    Respiratory Therapy Supplies (FLUTTER) DEVI 1 Device by Does not apply route as needed. 1 each 0   sodium chloride HYPERTONIC 3 % nebulizer solution Take by nebulization 2 (two) times daily as needed for cough. 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 750 mL 12   traMADol (ULTRAM) 50 MG tablet Take  1 tablet (50 mg total) by mouth every 12 (twelve) hours as needed. 180 tablet 1   traZODone (DESYREL) 100 MG tablet TAKE 1 TO 1 AND 1/2 TABLETS AT BEDTIME 135 tablet 1   No current facility-administered medications on file prior to visit.    Allergies  Allergen Reactions   Ace Inhibitors Swelling    Angioedema; makes tongue "break out"    Amitriptyline Hcl Other (See Comments)     makes her too sleepy!   Atorvastatin Other (See Comments)    SEVERE MYALGIA   Cymbalta [Duloxetine Hcl] Nausea Only and Other (See Comments)    Sleepiness/ sick   Dilantin [Phenytoin Sodium Extended] Rash    Severe rash   Paroxetine Nausea Only    Rapid heartbeat   Ramipril Other (See Comments)    TONGUE ULCERS    Rosuvastatin Other (See Comments)    SEVERE MYALGIA   Carbamazepine Rash   Codeine Itching   Paxlovid [Nirmatrelvir-Ritonavir] Itching   Phenytoin Sodium Extended Rash   Simvastatin Other (See Comments)    Increase in CK, myalgias   Wellbutrin [Bupropion] Palpitations    Social History   Socioeconomic History   Marital status: Divorced    Spouse name: Not on file   Number of children: 2   Years of education: 14   Highest education level: Not on file  Occupational History   Occupation: DISABLED  Tobacco Use   Smoking status: Former    Packs/day: 1.00    Years: 15.00    Total pack years: 15.00    Types: Cigarettes    Quit date: 08/20/1989    Years since quitting: 32.6    Passive exposure: Past   Smokeless tobacco: Never  Vaping Use   Vaping Use: Never used  Substance and Sexual Activity   Alcohol use: Not Currently    Alcohol/week: 3.0 standard drinks of alcohol    Types: 3 Glasses of wine per  week   Drug use: Not Currently    Types: Marijuana    Comment: CBD gummy occ   Sexual activity: Not Currently  Other Topics Concern   Not on file  Social History Narrative   Lives alone   Caffeine ZOX:WRUE   Retired from [employer]   2 children -- 5 grandbabies   Live alone   Social Determinants of Health   Financial Resource Strain: Low Risk  (12/08/2020)   Overall Financial Resource Strain (CARDIA)    Difficulty of Paying Living Expenses: Not hard at all  Food Insecurity: No Food Insecurity (12/08/2020)   Hunger Vital Sign    Worried About Running Out of Food in the Last Year: Never true    Ran Out of Food in the Last Year: Never true  Transportation Needs: No Transportation Needs (12/08/2020)   PRAPARE - Hydrologist (Medical): No    Lack of Transportation (Non-Medical): No  Physical Activity: Inactive (12/08/2020)   Exercise Vital Sign    Days of Exercise per Week: 0 days    Minutes of Exercise per Session: 0 min  Stress: No Stress Concern Present (12/08/2020)   [place]    Feeling of Stress : Not at all  Social Connections: Moderately Isolated (12/08/2020)   Social Connection and Isolation Panel [NHANES]    Frequency of Communication with Friends and Family: More than three times a week    Frequency of Social Gatherings with Friends and Family: More than three  times a week    Attends Religious Services: 1 to 4 times per year    Active Member of Clubs or Organizations: No    Attends Archivist Meetings: Never    Marital Status: Divorced  Human resources officer Violence: Not At Risk (12/08/2020)   Humiliation, Afraid, Rape, and Kick questionnaire    Fear of Current or Ex-Partner: No    Emotionally Abused: No    Physically Abused: No    Sexually Abused: No    Family History  Problem Relation Age of Onset   Diabetes Mother    Alzheimer's disease Mother    Hypertension  Mother    Obesity Mother    Other Brother        Thyroid problem 10/2016   Allergies Sister    Heart disease Sister    Heart disease Brother    Prostate cancer Brother 85       same brother as throat cancer   Throat cancer Brother        dx in his 27s; also a smoker   Heart disease Father    Cancer Maternal Grandmother 41       colon cancer or abdominal cancer   Lung cancer Maternal Grandfather 78   Breast cancer Maternal Aunt 55   Colon cancer Maternal Aunt 61       same sister as breast at 27   Lung cancer Maternal Uncle    Breast cancer Maternal Aunt        dx in her 39s   Pancreatic cancer Cousin 60       maternal first cousin   Breast cancer Cousin        paternal first cousin twice removed died in her 24s   Heart disease Son        Cardiac Arrest 07/2016   Stroke Neg Hx     Past Surgical History:  Procedure Laterality Date   ABI  2006   normal   BREAST EXCISIONAL BIOPSY     BREAST LUMPECTOMY WITH RADIOACTIVE SEED LOCALIZATION Right 01/03/2015   Procedure: BREAST LUMPECTOMY WITH RADIOACTIVE SEED LOCALIZATION;  Surgeon: Autumn Messing III, MD;  Location: [facility];  Service: General;  Laterality: Right;   CARDIAC DEFIBRILLATOR PLACEMENT  2006; 2012   BSX single chamber ICD   carotid dopplers  2006   neg   CEREBRAL ANEURYSM REPAIR  02/1999   COLONOSCOPY     HAMMER TOE SURGERY  11/15/2020   3rd digit bilateral feet    hospitalization  2004   GI bleed, PUD, diverticulosis (EGD,colonscopy)   hospitalization     PE, NSVT, s/p defib   KNEE ARTHROSCOPY     bilateral   LEFT HEART CATHETERIZATION WITH CORONARY ANGIOGRAM N/A 02/22/2012   Procedure: LEFT HEART CATHETERIZATION WITH CORONARY ANGIOGRAM;  Surgeon: Burnell Blanks, MD;  Location[facility] C[facility];  Service: Cardiovascular;  Laterality: N/A;   PARTIAL HYSTERECTOMY     Fibroids   TONGUE BIOPSY  12/12/2017   due to sore tongue and white patches/abnormal cells   TOTAL KNEE ARTHROPLASTY Left 02/13/2016   Procedure: TOTAL  KNEE ARTHROPLASTY;  Surgeon: Vickey Huger, MD;  Location: [facility];  Service: Orthopedics;  Laterality: Left;   TOTAL KNEE ARTHROPLASTY Right 05/22/2021   Procedure: TOTAL KNEE ARTHROPLASTY;  Surgeon: Vickey Huger, MD;  Location: [facility];  Service: Orthopedics;  Laterality: Right;  with block   UPPER GASTROINTESTINAL ENDOSCOPY      ROS: Review of Systems Negative except as stated  above  PHYSICAL EXAM: BP 136/62 (BP Location: Left Arm, Patient Position: Sitting, Cuff Size: Large)   Pulse 73   Temp 98.3 F (36.8 C)   Resp 16   Ht 5' 5.51" (1.664 m)   Wt 205 lb (93 kg)   LMP  (LMP Unknown)   SpO2 100%   BMI 33.58 kg/m   Physical Exam HENT:     Head: Normocephalic and atraumatic.  Eyes:     Extraocular Movements: Extraocular movements intact.     Conjunctiva/sclera: Conjunctivae normal.     Pupils: Pupils are equal, round, and reactive to light.  Cardiovascular:     Rate and Rhythm: Normal rate and regular rhythm.     Pulses: Normal pulses.     Heart sounds: Normal heart sounds.  Pulmonary:     Effort: Pulmonary effort is normal.     Breath sounds: Normal breath sounds.  Musculoskeletal:     Cervical back: Normal range of motion and neck supple.  Neurological:     General: No focal deficit present.     Mental Status: She is alert and oriented to person, place, and time.  Psychiatric:        Mood and Affect: Mood normal.        Behavior: Behavior normal.    ASSESSMENT AND PLAN: 1. Neuropathic pain 2. Muscle spasm - Gabapentin as prescribed. Counseled on medication adherence and adverse effects.  - Update BMP.  - Referral to Neurology for further evaluation and management.  - Basic Metabolic Panel - gabapentin (NEURONTIN) 300 MG capsule; Take 1 capsule (300 mg total) by mouth at bedtime.  Dispense: 30 capsule; Refill: 1 - Ambulatory referral to Neurology  3. Vitamin D deficiency, unspecified - Screening.  - Vitamin D, 25-hydroxy   Patient was given the opportunity to  ask questions.  Patient verbalized understanding of the plan and was able to repeat key elements of the plan. Patient was given clear instructions to go to Emergency Department or return to medical center if symptoms don't improve, worsen, or new problems develop.The patient verbalized understanding.   Orders Placed This Encounter  Procedures   Basic Metabolic Panel   Vitamin D, 25-hydroxy   Ambulatory referral to Neurology     Requested Prescriptions   Signed Prescriptions Disp Refills   gabapentin (NEURONTIN) 300 MG capsule 30 capsule 1    Sig: Take 1 capsule (300 mg total) by mouth at bedtime.    Follow-up with primary provider as scheduled.  Camillia Herter, NP

## 2022-03-20 NOTE — Progress Notes (Signed)
Remote ICD transmission.   

## 2022-03-23 ENCOUNTER — Ambulatory Visit (INDEPENDENT_AMBULATORY_CARE_PROVIDER_SITE_OTHER): Payer: Medicare Other | Admitting: Obstetrics and Gynecology

## 2022-03-23 ENCOUNTER — Encounter: Payer: Self-pay | Admitting: Obstetrics and Gynecology

## 2022-03-23 ENCOUNTER — Encounter: Payer: Self-pay | Admitting: Family

## 2022-03-23 ENCOUNTER — Ambulatory Visit (INDEPENDENT_AMBULATORY_CARE_PROVIDER_SITE_OTHER): Payer: Medicare Other | Admitting: Family

## 2022-03-23 VITALS — BP 136/62 | HR 73 | Temp 98.3°F | Resp 16 | Ht 65.51 in | Wt 205.0 lb

## 2022-03-23 VITALS — BP 110/73 | HR 71

## 2022-03-23 DIAGNOSIS — M792 Neuralgia and neuritis, unspecified: Secondary | ICD-10-CM

## 2022-03-23 DIAGNOSIS — E559 Vitamin D deficiency, unspecified: Secondary | ICD-10-CM

## 2022-03-23 DIAGNOSIS — M62838 Other muscle spasm: Secondary | ICD-10-CM | POA: Diagnosis not present

## 2022-03-23 DIAGNOSIS — N3281 Overactive bladder: Secondary | ICD-10-CM | POA: Diagnosis not present

## 2022-03-23 MED ORDER — GABAPENTIN 300 MG PO CAPS: 300.0000 mg | ORAL_CAPSULE | Freq: Every day | ORAL | 1 refills | Status: DC

## 2022-03-23 NOTE — Patient Instructions (Addendum)
Today we talked about ways to manage bladder urgency such as altering your diet to avoid irritative beverages and foods (bladder diet) as well as attempting to decrease stress and other exacerbating factors.    The Most Bothersome Foods* The Least Bothersome Foods*  Coffee - Regular & Decaf Tea - caffeinated Carbonated beverages - cola, non-colas, diet & caffeine-free Alcohols - Beer, Red Wine, White Wine, Champagne Fruits - Grapefruit, Lemon, Orange, Pineapple Fruit Juices - Cranberry, Grapefruit, Orange, Pineapple Vegetables - Tomato & Tomato Products Flavor Enhancers - Hot peppers, Spicy foods, Chili, Horseradish, Vinegar, Monosodium glutamate (MSG) Artificial Sweeteners - NutraSweet, Sweet 'N Low, Equal (sweetener), Saccharin Ethnic foods - Mexican, Thai, Indian food Water Milk - low-fat & whole Fruits - Bananas, Blueberries, Honeydew melon, Pears, Raisins, Watermelon Vegetables - Broccoli, Brussels Sprouts, Cabbage, Carrots, Cauliflower, Celery, Cucumber, Mushrooms, Peas, Radishes, Squash, Zucchini, White potatoes, Sweet potatoes & yams Poultry - Chicken, Eggs, Turkey, Meat - Beef, Pork, Lamb Seafood - Shrimp, Tuna fish, Salmon Grains - Oat, Rice Snacks - Pretzels, Popcorn  *Friedlander J. et al. Diet and its role in interstitial cystitis/bladder pain syndrome (IC/BPS) and comorbid conditions. [employer]. BJU Int. 2012 Jan 11.   We discussed the symptoms of overactive bladder (OAB), which include urinary urgency, urinary frequency, night-time urination, with or without urge incontinence.  We discussed management including behavioral therapy (decreasing bladder irritants by following a bladder diet, urge suppression strategies, timed voids, bladder retraining), physical therapy, medication; and for refractory cases posterior tibial nerve stimulation, sacral neuromodulation, and intravesical botulinum toxin injection.   

## 2022-03-23 NOTE — Progress Notes (Signed)
[facility] Return Visit  SUBJECTIVE  History of Present Illness: [name] is a 71 y.o. female seen in follow-up for overactive bladder. Plan at last visit was to start pelvic floor PT and decrease bladder irritants (coffee, soda).   Leakage is better, has a little bit more control with her pelvic floor. She is able to stop the leakage after it starts. She is still urinating about 3 times per night. Drinks 1 cup coffee, 16oz soda, 6oz gatorade, 16oz body armour, about 16 oz water.   Past Medical History: Patient  has a past medical history of AICD (automatic cardioverter/defibrillator) present, Anxiety, Arthritis, Back pain, Breast cancer ([place]) (2016), Bronchiectasis ([place]), Cerebral aneurysm, nonruptured, CHF (congestive heart failure) ([place]), Clostridium difficile infection, Depressive disorder, not elsewhere classified, Diverticulosis of colon (without mention of hemorrhage), Esophageal reflux, Fatty liver, Fibromyalgia, Gastritis, GERD (gastroesophageal reflux disease), GI bleed (2004), Glaucoma, Hiatal hernia, History of COVID-19 (05/04/2021), Hyperlipidemia, Hypertension, Hypothyroidism, Internal hemorrhoids, Joint pain, Multifocal pneumonia (06/03/2021), Obstructive sleep apnea (adult) (pediatric), Osteoarthritis, Ostium secundum type atrial septal defect, Other chronic nonalcoholic liver disease, Other pulmonary embolism and infarction, Palpitations, Paroxysmal ventricular tachycardia ([place]), Personal history of radiation therapy, PONV (postoperative nausea and vomiting), Presence of permanent cardiac pacemaker, PUD (peptic ulcer disease), Radiation (02/03/15-03/10/15), Sarcoid, Schatzki's ring, Shortness of breath, Sleep apnea, Stroke ([place]) (2013), Takotsubo syndrome, Tubular adenoma of colon, Unspecified transient cerebral ischemia, and Unspecified vitamin D deficiency.   Past Surgical History: She  has a past surgical history that includes Partial hysterectomy; Cerebral  aneurysm repair (02/1999); Knee arthroscopy; Cardiac defibrillator placement (2006; 2012); carotid dopplers (2006); hospitalization (2004); hospitalization; ABI (2006); left heart catheterization with coronary angiogram (N/A, 02/22/2012); Breast lumpectomy with radioactive seed localization (Right, 01/03/2015); Total knee arthroplasty (Left, 02/13/2016); Breast excisional biopsy; Tongue Biopsy (12/12/2017); Colonoscopy; Upper gastrointestinal endoscopy; Hammer toe surgery (11/15/2020); and Total knee arthroplasty (Right, 05/22/2021).   Medications: She has a current medication list which includes the following prescription(s): acetaminophen, albuterol, albuterol, vitamin c, aspirin ec, azelastine, baclofen, benzonatate, budesonide-formoterol, cetirizine, dialyvite vitamin d 5000, diclofenac sodium, diltiazem, empagliflozin, ezetimibe, famotidine, famotidine, fexofenadine, fluticasone, folic acid, furosemide, hydroxypropyl methylcellulose / hypromellose, latanoprost, levothyroxine, magnesium oxide, methocarbamol, mometasone, montelukast, multiple vitamin, nitroglycerin, nystatin, pantoprazole, polyethylene glycol powder, potassium chloride, promethazine-dextromethorphan, flutter, sodium chloride hypertonic, sodium chloride hypertonic, tramadol, and trazodone.   Allergies: Patient is allergic to ace inhibitors, amitriptyline hcl, atorvastatin, cymbalta [duloxetine hcl], dilantin [phenytoin sodium extended], paroxetine, ramipril, rosuvastatin, carbamazepine, codeine, paxlovid [nirmatrelvir-ritonavir], phenytoin sodium extended, simvastatin, and wellbutrin [bupropion].   Social History: Patient  reports that she quit smoking about 32 years ago. Her smoking use included cigarettes. She has a 15.00 pack-year smoking history. She has never used smokeless tobacco. She reports that she does not currently use alcohol after a past usage of about 3.0 standard drinks of alcohol per week. She reports that she does not  currently use drugs after having used the following drugs: Marijuana.      OBJECTIVE     Physical Exam: Vitals:   03/23/22 0927  BP: 110/73  Pulse: 71   Gen: No apparent distress, A&O x 3.  Detailed Urogynecologic Evaluation:  Deferred.    ASSESSMENT AND PLAN    Ms. Antilla is a 71 y.o. with:  1. Overactive bladder    - We discussed management including behavioral therapy,  medication and for refractory cases posterior tibial nerve stimulation, sacral neuromodulation, and intravesical botulinum toxin injection.  - She does not want to start medication at this time and is  not interested in PTNS.  - She will continue with her pelvic floor exercises at home.  - Also encouraged decreasing coffee and soda intake and drinking more plain water.   Return as needed  Jaquita Folds, MD  Time spent: I spent 20 minutes dedicated to the care of this patient on the date of this encounter to include pre-visit review of records, face-to-face time with the patient and post visit documentation.

## 2022-03-23 NOTE — Progress Notes (Signed)
Pt presents for generalized body muscle spasms

## 2022-03-24 LAB — BASIC METABOLIC PANEL
BUN/Creatinine Ratio: 12 (ref 12–28)
BUN: 10 mg/dL (ref 8–27)
CO2: 22 mmol/L (ref 20–29)
Calcium: 9.8 mg/dL (ref 8.7–10.3)
Chloride: 101 mmol/L (ref 96–106)
Creatinine, Ser: 0.86 mg/dL (ref 0.57–1.00)
Glucose: 101 mg/dL — ABNORMAL HIGH (ref 70–99)
Potassium: 4.2 mmol/L (ref 3.5–5.2)
Sodium: 138 mmol/L (ref 134–144)
eGFR: 71 mL/min/{1.73_m2} (ref >59)

## 2022-03-24 LAB — VITAMIN D 25 HYDROXY (VIT D DEFICIENCY, FRACTURES): Vit D, 25-Hydroxy: 74.1 ng/mL (ref 30.0–100.0)

## 2022-03-27 ENCOUNTER — Ambulatory Visit
Admission: RE | Admit: 2022-03-27 | Discharge: 2022-03-27 | Disposition: A | Discharge status: Home / Self Care | Payer: Medicare Other | Source: Ambulatory Visit | Attending: Family | Admitting: Family

## 2022-03-27 DIAGNOSIS — Z1231 Encounter for screening mammogram for malignant neoplasm of breast: Secondary | ICD-10-CM

## 2022-03-28 ENCOUNTER — Encounter (INDEPENDENT_AMBULATORY_CARE_PROVIDER_SITE_OTHER): Payer: Self-pay

## 2022-03-29 ENCOUNTER — Other Ambulatory Visit: Payer: Self-pay

## 2022-03-29 MED ORDER — DILTIAZEM HCL ER COATED BEADS 120 MG PO CP24: 120.0000 mg | ORAL_CAPSULE | Freq: Every day | ORAL | 2 refills | Status: DC

## 2022-04-04 ENCOUNTER — Other Ambulatory Visit: Payer: Self-pay

## 2022-04-04 DIAGNOSIS — M62838 Other muscle spasm: Secondary | ICD-10-CM

## 2022-04-04 DIAGNOSIS — M792 Neuralgia and neuritis, unspecified: Secondary | ICD-10-CM

## 2022-04-04 MED ORDER — GABAPENTIN 300 MG PO CAPS: 300.0000 mg | ORAL_CAPSULE | Freq: Every day | ORAL | 2 refills | Status: DC

## 2022-04-04 NOTE — Progress Notes (Signed)
Gabapentin Rx sent to Mail order pharmacy

## 2022-04-09 ENCOUNTER — Encounter: Payer: Self-pay | Admitting: Pulmonary Disease

## 2022-04-09 ENCOUNTER — Ambulatory Visit (INDEPENDENT_AMBULATORY_CARE_PROVIDER_SITE_OTHER): Payer: Medicare Other | Admitting: Pulmonary Disease

## 2022-04-09 ENCOUNTER — Ambulatory Visit: Payer: Medicare Other | Admitting: Pulmonary Disease

## 2022-04-09 VITALS — BP 120/70 | HR 68 | Ht 66.5 in | Wt 211.8 lb

## 2022-04-09 DIAGNOSIS — G4733 Obstructive sleep apnea (adult) (pediatric): Secondary | ICD-10-CM

## 2022-04-09 DIAGNOSIS — R058 Other specified cough: Secondary | ICD-10-CM

## 2022-04-09 DIAGNOSIS — I2510 Atherosclerotic heart disease of native coronary artery without angina pectoris: Secondary | ICD-10-CM

## 2022-04-09 DIAGNOSIS — J479 Bronchiectasis, uncomplicated: Secondary | ICD-10-CM

## 2022-04-09 DIAGNOSIS — I2584 Coronary atherosclerosis due to calcified coronary lesion: Secondary | ICD-10-CM

## 2022-04-09 DIAGNOSIS — J849 Interstitial pulmonary disease, unspecified: Secondary | ICD-10-CM

## 2022-04-09 MED ORDER — NYSTATIN 100000 UNIT/ML MT SUSP
5.0000 mL | Freq: Four times a day (QID) | OROMUCOSAL | 0 refills | Status: DC
Start: 2022-04-09 — End: 2023-04-02

## 2022-04-09 MED ORDER — BUDESONIDE-FORMOTEROL FUMARATE 80-4.5 MCG/ACT IN AERO: 1.0000 | INHALATION_SPRAY | Freq: Two times a day (BID) | RESPIRATORY_TRACT | 12 refills | Status: DC

## 2022-04-09 NOTE — Progress Notes (Signed)
[facility]  Chief Complaint  Patient presents with   Follow-up    Follow-up     Constitutional:  BP 120/70 (BP Location: Right Arm)   Pulse 68   Ht 5' 6.5" (1.689 m)   Wt 211 lb 12.8 oz (96.1 kg)   LMP  (LMP Unknown)   SpO2 97%   BMI 33.67 kg/m   Past Medical History:  Vit D deficiency, Takotsubo CM, Schatzki's ring, PUD, VT s/p AICD, PE, NASH, ASD, Hypothyroidism, HTN, HLD, HH, GERD, Fibromyalgia, Diverticulosis, Depression, C diff, Cerebral aneurysm, Breast cancer 2016, OA, COVID 19 pneumonia September 2022  Past Surgical History:  Her  has a past surgical history that includes Partial hysterectomy; Cerebral aneurysm repair (02/1999); Knee arthroscopy; Cardiac defibrillator placement (2006; 2012); carotid dopplers (2006); hospitalization (2004); hospitalization; ABI (2006); left heart catheterization with coronary angiogram (N/A, 02/22/2012); Breast lumpectomy with radioactive seed localization (Right, 01/03/2015); Total knee arthroplasty (Left, 02/13/2016); Breast excisional biopsy; Tongue Biopsy (12/12/2017); Colonoscopy; Upper gastrointestinal endoscopy; Hammer toe surgery (11/15/2020); Total knee arthroplasty (Right, 05/22/2021); and Breast lumpectomy (Right, 2016).  Brief Summary:  [name] is a 71 y.o. female former smoker with bronchiectasis and obstructive sleep apnea.  She had COVID pneumonia in October 2022.      Subjective:   Breathing doing well.  Still has throat irritation.  She lost her spacer device.  Sleeping okay.  Physical Exam:   Appearance - well kempt   ENMT - no sinus tenderness, no oral exudate, no LAN, Mallampati 3 airway, no stridor, mild erythema posterior pharynx  Respiratory - equal breath sounds bilaterally, no wheezing or rales  CV - s1s2 regular rate and rhythm, no murmurs  Ext - no clubbing, no edema  Skin - no rashes  Psych - normal mood and affect       Pulmonary testing:   PFT 04/17/12 >> FEV1 2.60 (110%), FEV1% 85, TLC 4.05 (77%), DLCO 44% PFT 01/17/17 >> FEV1 2.38 (111%), FEV1% 90, TLC 3.85 (71%), DLCO 58%, no BD RAST 05/30/17 >> dust mites PFT 10/16/18 >> FEV1 2.27 (111%), FEV1% 90, TLC 4.00 (74%), DLCO 59%  Serology:  04/02/12 >> ACE 1 10/25/15 >> ESR 40, ACE 32 05/30/17 >> IgA, IgG, IgM normal 10/16/18 >> HP panel negative, ACE 25  Chest Imaging:  CT chest 04/10/12 >> b/l lower lobe cylindrical BTX and some in upper lobes CT angio chest 10/26/15 >> patchy GGO in periphery of lungs b/l, basilar BTX, no PE; no significant change compared to 2013 HRCT chest 10/08/18 >> atherosclerosis, basilar predominant cylindrical BTX, mild centrilobular/paraseptal emphysema, air trapping CT angio chest 03/24/19 >> Chronic lung changes with peribronchial thickening, bibasilar atelectasis and chronic interstitial disease/peripheral fibrosis at the lung bases CT angio chest 06/03/21 >> scattered GGO with fibrotic changes CT angio chest 08/30/21 >> BTX with emphysema and fibrotic changes in periphery  Sleep Tests:  PSG 11/29/08 >> AHI 9 HST 11/10/15 >> AHI 7.1, SaO2 low 75% CPAP 12/11/20 to 01/09/21 >> used on 25 of 30 nights with average 4 hrs 49 min.  Average AHI 2.2 with CPAP 6 cm H2O.  Cardiac Tests:  Echo 08/11/21 >> EF 50 to 55%, RVSP 39.5 mmHg  Social History:  She  reports that she quit smoking about 32 years ago. Her smoking use included cigarettes. She has a 15.00 pack-year smoking history. She has been exposed to tobacco smoke. She has never used smokeless tobacco. She reports that she does not currently use alcohol  after a past usage of about 3.0 standard drinks of alcohol per week. She reports that she does not currently use drugs after having used the following drugs: Marijuana.  Family History:  Her family history includes Allergies in her sister; Alzheimer's disease in her mother; Breast cancer in her cousin and maternal aunt; Breast cancer (age of onset: 10) in her  maternal aunt; Cancer (age of onset: 103) in her maternal grandmother; Colon cancer (age of onset: 58) in her maternal aunt; Diabetes in her mother; Heart disease in her brother, father, sister, and son; Hypertension in her mother; Lung cancer in her maternal uncle; Lung cancer (age of onset: 57) in her maternal grandfather; Obesity in her mother; Other in her brother; Pancreatic cancer (age of onset: 64) in her cousin; Prostate cancer (age of onset: 40) in her brother; Throat cancer in her brother.     Assessment/Plan:   History of COVID 19 pneumonia from October 2022. - she has changes of ILD on CT from prior COVID pneumonia, but has improvement on CT chest from January 2023 - she has follow up PFT in September and HRCT chest in November 2023  Chronic respiratory failure with hypoxia. - 2 liters oxygen prn with exertion - reassess home oxygen need at next follow up  Bronchiectasis. - change to symbicort one puff bid - if she still has issues with thrush and throat irritation, then reorder spacer device - prn albuterol, mucinex, flutter valve, hypertonic saline  Upper airway cough syndrome with allergic rhinitis. - continue singulair, allegra, flonase, Nav[place] nasal irrigation   Obstructive sleep apnea. - she is compliant with CPAP and reports benefit - she uses [employer] for her DME - continue CPAP 5 cm H2O - might be able to reassess need for therapy if she is able to decrease her weight more  Thrush. - will give additional course of nystatin  Non-ischemic cardiomyopathy. - followed by Dr. Irish Lack and Dr. Caryl Comes with [facility]   Time Spent Involved in Patient Care on Day of Examination:  25 minutes  Follow up:   Patient Instructions  Change symbicort to 1 puff twice per day, and rinse your mouth after each use  Nystatin 5 ml four times daily swish and swallow for 5 days  Follow up in 3 months  Medication List:   Allergies as of 04/09/2022       Reactions   Ace  Inhibitors Swelling   Angioedema; makes tongue "break out"   Amitriptyline Hcl Other (See Comments)    makes her too sleepy!   Atorvastatin Other (See Comments)   SEVERE MYALGIA   Cymbalta [duloxetine Hcl] Nausea Only, Other (See Comments)   Sleepiness/ sick   Dilantin [phenytoin Sodium Extended] Rash   Severe rash   Paroxetine Nausea Only   Rapid heartbeat   Ramipril Other (See Comments)   TONGUE ULCERS   Rosuvastatin Other (See Comments)   SEVERE MYALGIA   Carbamazepine Rash   Codeine Itching   Paxlovid [nirmatrelvir-ritonavir] Itching   Phenytoin Sodium Extended Rash   Simvastatin Other (See Comments)   Increase in CK, myalgias   Wellbutrin [bupropion] Palpitations        Medication List        Accurate as of April 09, 2022 12:32 PM. If you have any questions, ask your nurse or doctor.          acetaminophen 500 MG tablet Commonly known as: TYLENOL Take 500 mg by mouth every 6 (six) hours as needed for  moderate pain.   albuterol (2.5 MG/3ML) 0.083% nebulizer solution Commonly known as: PROVENTIL USE 1 VIAL IN NEBULIZER EVERY 6 HOURS What changed: See the new instructions.   albuterol 108 (90 Base) MCG/ACT inhaler Commonly known as: Ventolin HFA Inhale 2 puffs into the lungs every 6 (six) hours as needed for wheezing or shortness of breath. What changed: Another medication with the same name was changed. Make sure you understand how and when to take each.   aspirin EC 81 MG tablet Take 81 mg by mouth daily. Swallow whole.   azelastine 0.1 % nasal spray Commonly known as: ASTELIN Place 1 spray into both nostrils 2 (two) times daily. Use in each nostril as directed   baclofen 10 MG tablet Commonly known as: LIORESAL Take 1 tablet (10 mg total) by mouth at bedtime as needed for muscle spasms.   budesonide-formoterol 80-4.5 MCG/ACT inhaler Commonly known as: Symbicort Inhale 1 puff into the lungs 2 (two) times daily. What changed:  how much to  take how to take this when to take this additional instructions Changed by: Chesley Mires, MD   cetirizine 10 MG tablet Commonly known as: ZYRTEC Take 10 mg by mouth daily as needed for allergies.   Dialyvite Vitamin D 5000 125 MCG (5000 UT) capsule Generic drug: Cholecalciferol Take 5,000 Units by mouth daily.   diclofenac Sodium 1 % Gel Commonly known as: VOLTAREN Apply 2-4 grams to affected joint 4 times daily as needed. What changed:  how much to take how to take this when to take this reasons to take this additional instructions   diltiazem 120 MG 24 hr capsule Commonly known as: Cardizem CD Take 1 capsule (120 mg total) by mouth daily. Do Not start this medication until after you wean off Carvedilol.   ezetimibe 10 MG tablet Commonly known as: ZETIA Take 1 tablet (10 mg total) by mouth daily.   famotidine 40 MG tablet Commonly known as: Pepcid Take 1 tablet (40 mg total) by mouth at bedtime.   fexofenadine 180 MG tablet Commonly known as: ALLEGRA Take 180 mg by mouth daily as needed for allergies (alternates with zyrtec sometimes).   fluticasone 50 MCG/ACT nasal spray Commonly known as: FLONASE Place 2 sprays into both nostrils daily as needed for allergies or rhinitis.   Flutter Devi 1 Device by Does not apply route as needed.   FOLIC ACID PO Take 1 tablet by mouth daily.   furosemide 20 MG tablet Commonly known as: LASIX Take 40 mg (2 tabs) 3 days per week. Take 20 mg the other days of the week.   gabapentin 300 MG capsule Commonly known as: NEURONTIN Take 1 capsule (300 mg total) by mouth at bedtime.   hydroxypropyl methylcellulose / hypromellose 2.5 % ophthalmic solution Commonly known as: ISOPTO TEARS / GONIOVISC Place 1 drop into both eyes at bedtime.   JARDIANCE PO Take by mouth.   latanoprost 0.005 % ophthalmic solution Commonly known as: XALATAN Place 1 drop into both eyes at bedtime.   levothyroxine 50 MCG tablet Commonly known as:  SYNTHROID TAKE 1 TABLET EVERY DAY   magnesium oxide 400 MG tablet Commonly known as: MAG-OX Take 1 tablet (400 mg total) by mouth 2 (two) times daily.   mometasone 0.1 % lotion Commonly known as: ELOCON Apply 1 application topically daily as needed (rash).   montelukast 10 MG tablet Commonly known as: SINGULAIR TAKE 1 TABLET AT BEDTIME   MULTIVITAMIN PO Take 1 tablet by mouth daily.   nitroGLYCERIN  0.4 MG SL tablet Commonly known as: NITROSTAT DISSOLVE ONE TABLET UNDER THE TONGUE EVERY 5 MINUTES AS NEEDED FOR CHEST PAIN.  DO NOT EXCEED A TOTAL OF 3 DOSES IN 15 MINUTES What changed: See the new instructions.   nystatin 100000 UNIT/ML suspension Commonly known as: MYCOSTATIN Take 5 mLs (500,000 Units total) by mouth 4 (four) times daily.   pantoprazole 40 MG tablet Commonly known as: PROTONIX Take 1 tablet (40 mg total) by mouth daily.   polyethylene glycol powder 17 GM/SCOOP powder Commonly known as: GLYCOLAX/MIRALAX Take 17 grams by mouth daily   potassium chloride 10 MEQ tablet Commonly known as: KLOR-CON M TAKE 1/2 TABLET EVERY DAY (CALL OFFICE TO SCHEDULE YEARLY APPT)   sodium chloride HYPERTONIC 3 % nebulizer solution Take by nebulization 2 (two) times daily as needed for cough. 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   traMADol 50 MG tablet Commonly known as: ULTRAM Take 1 tablet (50 mg total) by mouth every 12 (twelve) hours as needed.   traZODone 100 MG tablet Commonly known as: DESYREL TAKE 1 TO 1 AND 1/2 TABLETS AT BEDTIME   vitamin C 1000 MG tablet Take 1,000 mg by mouth daily.        Signature:  Chesley Mires, MD [place] Pager - [phone] 04/09/2022, 12:32 PM

## 2022-04-09 NOTE — Patient Instructions (Signed)
Change symbicort to 1 puff twice per day, and rinse your mouth after each use  Nystatin 5 ml four times daily swish and swallow for 5 days  Follow up in 3 months

## 2022-04-16 ENCOUNTER — Encounter: Payer: Self-pay | Admitting: Pulmonary Disease

## 2022-05-04 ENCOUNTER — Ambulatory Visit (INDEPENDENT_AMBULATORY_CARE_PROVIDER_SITE_OTHER): Payer: Medicare Other | Admitting: Neurology

## 2022-05-04 ENCOUNTER — Encounter: Payer: Self-pay | Admitting: Neurology

## 2022-05-04 VITALS — BP 99/65 | HR 79 | Ht 66.5 in | Wt 210.0 lb

## 2022-05-04 DIAGNOSIS — I2510 Atherosclerotic heart disease of native coronary artery without angina pectoris: Secondary | ICD-10-CM

## 2022-05-04 DIAGNOSIS — M62838 Other muscle spasm: Secondary | ICD-10-CM | POA: Diagnosis not present

## 2022-05-04 DIAGNOSIS — I2584 Coronary atherosclerosis due to calcified coronary lesion: Secondary | ICD-10-CM

## 2022-05-04 DIAGNOSIS — I671 Cerebral aneurysm, nonruptured: Secondary | ICD-10-CM

## 2022-05-04 DIAGNOSIS — R269 Unspecified abnormalities of gait and mobility: Secondary | ICD-10-CM

## 2022-05-04 MED ORDER — GABAPENTIN 100 MG PO CAPS: 100.0000 mg | ORAL_CAPSULE | Freq: Three times a day (TID) | ORAL | 6 refills | Status: DC

## 2022-05-04 MED ORDER — DIVALPROEX SODIUM ER 500 MG PO TB24: 500.0000 mg | ORAL_TABLET | Freq: Every evening | ORAL | 6 refills | Status: DC

## 2022-05-04 NOTE — Progress Notes (Addendum)
Chief Complaint  Patient presents with   Rm 14    Patient is here alone for evaluation of muscle spasm and nerve pain in her calves and ankles. She states it feels like a cramping. Her ankles feel like they are going to break. Symptoms have been going on for a couple of months.       ASSESSMENT AND PLAN  [name] is a 71 y.o. 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   MEDICAL HISTORY:  [name] is a 71 year old female, seen in request by primary care nurse practitioner Minette Brine, Amy J, for evaluation of frequent left lower extremity muscle spasm, she was a patient of Dr. Jannifer Franklin, seen by Judson Roch in August 2021    I reviewed and summarized the referring note. PMHX. HLD CHF Stroke Breast Cancer, right lobectomy, radiation therapy in 2016 Cerebral aneurysm, s/p clip in 2001, History of right knee replacement in August 2022, left side was in 2016.  She has a history of right brain aneurysm, status post clipping in 2001, denies residual deficit, but reported a history of allergic reaction to Dilantin/Tegretol, developed a rash, has been off epileptic medications for  many years, but she reported over the years, she has intermittent left hand posturing, she has no control over it for few minutes, denies loss of consciousness, denied generalized seizure activity  She is tearful during today's visit, she lost her friends of many years, lives alone, still attends elderly patient in their home,  She has chronic neck pain, getting worse over the past few months, since the beginning of 2023, she complains of frequent bilateral lower extremity muscle spasm, toes go up, only happened early morning when she first get up from overnight sleep, and can be very painful, she put mustard seed at her nightstand, she has to pace around to alleviate the symptoms  She also complains of worsening urinary incontinence, urgency, sometimes cannot make it to the bathroom  Personally reviewed CT cervical spine in June 2019: Chronic C5-6 solid arthrodesis/ankylosis, chronic thoracic spine ankylosis at T2-3, progressive upper cervical disc endplate and facet degeneration moderate spinal stenosis C4-5, mild C3-4, variable degree of foraminal narrowing  I was not able to [place], CT head without contrast by neuro surgeon Dr. Glenna Fellows in July 2000, aneurysm clip is demonstrated at the skull base on the right with post craniotomy changes, in the right frontotemporal region,  Repeat CT head February 2001 postoperative change of the right has resolved, ventricular system is normal, surgical clip on the right, no acute abnormality  Laboratory evaluation August 2023, normal vitamin D, BMP, creatinine 0.86, A1c 5.7, CPK, CBC, mild elevated alkaline phosphate 122, creatinine 0.89   PHYSICAL EXAM:   Vitals:   05/04/22 1116  BP: 99/65  Pulse: 79  Weight: 210 lb (95.3 kg)  Height: 5' 6.5" (1.689 m)   Not recorded     Body mass index is 33.39 kg/m.  PHYSICAL EXAMNIATION:  Gen: NAD, conversant, well nourised, well groomed                     Cardiovascular: Regular rate rhythm, no  peripheral edema, warm, nontender. Eyes: Conjunctivae clear without exudates or hemorrhage Neck: Supple, no carotid bruits. Pulmonary: Clear to auscultation bilaterally   NEUROLOGICAL EXAM:  MENTAL STATUS: Speech/cognition: Awake, alert, oriented to history taking and casual conversation CRANIAL NERVES: CN II: Visual fields are full to confrontation. Pupils are round equal and briskly reactive to light. CN III, IV, VI: extraocular movement are normal. No ptosis. CN V: Facial sensation is intact to light touch CN VII: Face is symmetric with normal eye closure  CN VIII: Hearing is normal to causal conversation. CN IX, X: Phonation is normal. CN XI: Head turning and shoulder shrug are intact  MOTOR: There is no pronator drift of out-stretched arms. Muscle bulk and tone are normal. Muscle strength is normal.  REFLEXES: Reflexes are 2 and symmetric at the biceps, triceps, knees, and trace at ankles. Plantar responses are extensor bilaterally  SENSORY: Intact to light touch, pinprick and vibratory sensation are intact in fingers and toes.  COORDINATION: There is no trunk or limb dysmetria noted.  GAIT/STANCE: She can get up arm crossed, wide-based, cautious stiff  REVIEW OF SYSTEMS:  Full 14 system review of systems performed and notable only for as above All other review of systems were negative.   ALLERGIES: Allergies  Allergen Reactions   Ace Inhibitors Swelling    Angioedema; makes tongue "break out"    Amitriptyline Hcl Other (See Comments)     makes her too sleepy!   Atorvastatin Other (See Comments)    SEVERE MYALGIA   Cymbalta [Duloxetine Hcl] Nausea Only and Other (See Comments)    Sleepiness/ sick   Dilantin [Phenytoin Sodium Extended] Rash    Severe rash   Paroxetine Nausea Only    Rapid heartbeat   Ramipril Other (See Comments)    TONGUE ULCERS    Rosuvastatin Other (See Comments)    SEVERE MYALGIA   Carbamazepine Rash   Codeine Itching   Paxlovid  [Nirmatrelvir-Ritonavir] Itching   Phenytoin Sodium Extended Rash   Simvastatin Other (See Comments)    Increase in CK, myalgias   Wellbutrin [Bupropion] Palpitations    HOME MEDICATIONS: Current Outpatient Medications  Medication Sig Dispense Refill   acetaminophen (TYLENOL) 500 MG tablet Take 500 mg by mouth every 6 (six) hours as needed for moderate pain.     albuterol (PROVENTIL) (2.5 MG/3ML) 0.083% nebulizer solution USE 1 VIAL IN NEBULIZER EVERY 6 HOURS (Patient taking differently: Take 2.5 mg by nebulization every 6 (six) hours as needed for shortness of breath.) 120 mL 11   albuterol (VENTOLIN HFA) 108 (90 Base) MCG/ACT inhaler Inhale 2 puffs into the lungs every 6 (six) hours as needed for wheezing or shortness of breath. 18 g 3   Ascorbic Acid (VITAMIN C) 1000 MG tablet Take 1,000 mg by mouth daily.     aspirin EC 81 MG tablet Take 81 mg by mouth daily. Swallow whole.     azelastine (ASTELIN) 0.1 % nasal spray Place 1 spray into both nostrils 2 (two) times daily. Use in each nostril as directed 90 mL 3   baclofen (LIORESAL) 10 MG tablet Take  1 tablet (10 mg total) by mouth at bedtime as needed for muscle spasms. 90 tablet 1   budesonide-formoterol (SYMBICORT) 80-4.5 MCG/ACT inhaler Inhale 1 puff into the lungs 2 (two) times daily. 1 each 12   cetirizine (ZYRTEC) 10 MG tablet Take 10 mg by mouth daily as needed for allergies.     Cholecalciferol (DIALYVITE VITAMIN D 5000) 125 MCG (5000 UT) capsule Take 5,000 Units by mouth daily.     diclofenac Sodium (VOLTAREN) 1 % GEL Apply 2-4 grams to affected joint 4 times daily as needed. (Patient taking differently: Apply 2-4 g topically 4 (four) times daily as needed (joint pain).) 400 g 2   diltiazem (CARDIZEM CD) 120 MG 24 hr capsule Take 1 capsule (120 mg total) by mouth daily. Do Not start this medication until after you wean off Carvedilol. 90 capsule 2   Empagliflozin (JARDIANCE PO) Take by mouth.     ezetimibe (ZETIA) 10 MG tablet Take  1 tablet (10 mg total) by mouth daily. 90 tablet 3   famotidine (PEPCID) 40 MG tablet Take 1 tablet (40 mg total) by mouth at bedtime. 360 tablet 6   fexofenadine (ALLEGRA) 180 MG tablet Take 180 mg by mouth daily as needed for allergies (alternates with zyrtec sometimes).     fluticasone (FLONASE) 50 MCG/ACT nasal spray Place 2 sprays into both nostrils daily as needed for allergies or rhinitis. 48 g 3   FOLIC ACID PO Take 1 tablet by mouth daily.     furosemide (LASIX) 20 MG tablet Take 40 mg (2 tabs) 3 days per week. Take 20 mg the other days of the week. 90 tablet 3   gabapentin (NEURONTIN) 300 MG capsule Take 1 capsule (300 mg total) by mouth at bedtime. 30 capsule 2   hydroxypropyl methylcellulose / hypromellose (ISOPTO TEARS / GONIOVISC) 2.5 % ophthalmic solution Place 1 drop into both eyes at bedtime.     latanoprost (XALATAN) 0.005 % ophthalmic solution Place 1 drop into both eyes at bedtime.     levothyroxine (SYNTHROID) 50 MCG tablet TAKE 1 TABLET EVERY DAY 90 tablet 1   magnesium oxide (MAG-OX) 400 MG tablet Take 1 tablet (400 mg total) by mouth 2 (two) times daily. 180 tablet 3   mometasone (ELOCON) 0.1 % lotion Apply 1 application topically daily as needed (rash). 60 mL 1   montelukast (SINGULAIR) 10 MG tablet TAKE 1 TABLET AT BEDTIME 90 tablet 3   Multiple Vitamins-Minerals (MULTIVITAMIN PO) Take 1 tablet by mouth daily.     nitroGLYCERIN (NITROSTAT) 0.4 MG SL tablet DISSOLVE ONE TABLET UNDER THE TONGUE EVERY 5 MINUTES AS NEEDED FOR CHEST PAIN.  DO NOT EXCEED A TOTAL OF 3 DOSES IN 15 MINUTES (Patient taking differently: Place 0.4 mg under the tongue every 5 (five) minutes as needed for chest pain.) 25 tablet 0   nystatin (MYCOSTATIN) 100000 UNIT/ML suspension Take 5 mLs (500,000 Units total) by mouth 4 (four) times daily. 60 mL 0   pantoprazole (PROTONIX) 40 MG tablet Take 1 tablet (40 mg total) by mouth daily. 90 tablet 3   polyethylene glycol powder (GLYCOLAX/MIRALAX) powder Take 17  grams by mouth daily 1080 g 0   potassium chloride (KLOR-CON M) 10 MEQ tablet TAKE 1/2 TABLET EVERY DAY (CALL OFFICE TO SCHEDULE YEARLY APPT) 30 tablet 11   Respiratory Therapy Supplies (FLUTTER) DEVI 1 Device by Does not apply route as needed. 1 each 0   sodium chloride HYPERTONIC 3 % nebulizer solution Take by nebulization 2 (two)  times daily as needed for cough. 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 750 mL 12   traMADol (ULTRAM) 50 MG tablet Take 1 tablet (50 mg total) by mouth every 12 (twelve) hours as needed. 180 tablet 1   traZODone (DESYREL) 100 MG tablet TAKE 1 TO 1 AND 1/2 TABLETS AT BEDTIME 135 tablet 1   No current facility-administered medications for this visit.    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   Bronchiectasis ([place])    Cerebral aneurysm, nonruptured    had a clip put in   CHF (congestive heart failure) (HCC)    Clostridium difficile infection    Depressive disorder, not elsewhere classified    Diverticulosis of colon (without mention of hemorrhage)    Esophageal reflux    Fatty liver    Fibromyalgia    Gastritis    GERD (gastroesophageal reflux disease)    GI bleed 2004   Glaucoma    Hiatal hernia    History of COVID-19 05/04/2021   Hyperlipidemia    Hypertension    Hypothyroidism    Internal hemorrhoids    Joint pain    Multifocal pneumonia 06/03/2021   Obstructive sleep apnea (adult) (pediatric)    Osteoarthritis    Ostium secundum type atrial septal defect    Other chronic nonalcoholic liver disease    Other pulmonary embolism and infarction    Palpitations    Paroxysmal ventricular tachycardia (HCC)    Personal history of radiation therapy    PONV (postoperative nausea and vomiting)    Presence of permanent cardiac pacemaker    PUD (peptic ulcer disease)    Radiation  02/03/15-03/10/15   Right Breast   Sarcoid    per pt , not sure   Schatzki's ring    Shortness of breath    Sleep apnea    Stroke [facility]) 2013   tia/ pt feels it was around 2008 0r 2009   Takotsubo syndrome    Tubular adenoma of colon    Unspecified transient cerebral ischemia    Unspecified vitamin D deficiency     PAST SURGICAL HISTORY: Past Surgical History:  Procedure Laterality Date   ABI  2006   normal   BREAST EXCISIONAL BIOPSY     BREAST LUMPECTOMY Right 2016   BREAST LUMPECTOMY WITH RADIOACTIVE SEED LOCALIZATION Right 01/03/2015   Procedure: BREAST LUMPECTOMY WITH RADIOACTIVE SEED LOCALIZATION;  Surgeon: Autumn Messing III, MD;  Location: [facility];  Service: General;  Laterality: Right;   CARDIAC DEFIBRILLATOR PLACEMENT  2006; 2012   BSX single chamber ICD   carotid dopplers  2006   neg   CEREBRAL ANEURYSM REPAIR  02/1999   COLONOSCOPY     HAMMER TOE SURGERY  11/15/2020   3rd digit bilateral feet    hospitalization  2004   GI bleed, PUD, diverticulosis (EGD,colonscopy)   hospitalization     PE, NSVT, s/p defib   KNEE ARTHROSCOPY     bilateral   LEFT HEART CATHETERIZATION WITH CORONARY ANGIOGRAM N/A 02/22/2012   Procedure: LEFT HEART CATHETERIZATION WITH CORONARY ANGIOGRAM;  Surgeon: Burnell Blanks, MD;  Location: [facility] CATH [facility];  Service: Cardiovascular;  Laterality: N/A;   PARTIAL HYSTERECTOMY     Fibroids   TONGUE BIOPSY  12/12/2017   due to sore tongue and white patches/abnormal cells  TOTAL KNEE ARTHROPLASTY Left 02/13/2016   Procedure: TOTAL KNEE ARTHROPLASTY;  Surgeon: Vickey Huger, MD;  Location: [facility];  Service: Orthopedics;  Laterality: Left;   TOTAL KNEE ARTHROPLASTY Right 05/22/2021   Procedure: TOTAL KNEE ARTHROPLASTY;  Surgeon: Vickey Huger, MD;  Location: [facility];  Service: Orthopedics;  Laterality: Right;  with block   UPPER GASTROINTESTINAL ENDOSCOPY      FAMILY HISTORY: Family History  Problem Relation Age of Onset   Diabetes Mother     Alzheimer's disease Mother    Hypertension Mother    Obesity Mother    Heart disease Father    Seizures Sister    Allergies Sister    Heart disease Sister    Other Brother        Thyroid problem 10/2016   Heart disease Brother    Prostate cancer Brother 17       same brother as throat cancer   Throat cancer Brother        dx in his 1s; also a smoker   Cancer Maternal Grandmother 29       colon cancer or abdominal cancer   Lung cancer Maternal Grandfather 78   Heart disease Son        Cardiac Arrest 07/2016   Breast cancer Maternal Aunt 55   Colon cancer Maternal Aunt 61       same sister as breast at 18   Breast cancer Maternal Aunt        dx in her 36s   Lung cancer Maternal Uncle    Pancreatic cancer Cousin 63       maternal first cousin   Breast cancer Cousin        paternal first cousin twice removed died in her 68s   Stroke Neg Hx     SOCIAL HISTORY: Social History   Socioeconomic History   Marital status: Divorced    Spouse name: Not on file   Number of children: 2   Years of education: 14   Highest education level: Not on file  Occupational History   Occupation: DISABLED  Tobacco Use   Smoking status: Former    Packs/day: 1.00    Years: 15.00    Total pack years: 15.00    Types: Cigarettes    Quit date: 08/20/1989    Years since quitting: 32.7    Passive exposure: Past   Smokeless tobacco: Never  Vaping Use   Vaping Use: Never used  Substance and Sexual Activity   Alcohol use: Yes    Alcohol/week: 3.0 standard drinks of alcohol    Types: 3 Glasses of wine per week    Comment: occasionally, not on a weekly basis   Drug use: Yes    Frequency: 1.0 times per week    Types: Marijuana    Comment: CBD gummy occ; marijuana occasionally   Sexual activity: Not Currently  Other Topics Concern   Not on file  Social History Narrative   Lives alone   Caffeine WNU:UVOZ   Retired from [employer]   2 children -- 5 grandbabies - 1 great-grandbaby    Social  Determinants of Health   Financial Resource Strain: Low Risk  (12/08/2020)   Overall Financial Resource Strain (CARDIA)    Difficulty of Paying Living Expenses: Not hard at all  Food Insecurity: No Food Insecurity (12/08/2020)   Hunger Vital Sign    Worried About Running Out of Food in the Last Year: Never true    Ran Out of  Food in the Last Year: Never true  Transportation Needs: No Transportation Needs (12/08/2020)   PRAPARE - Hydrologist (Medical): No    Lack of Transportation (Non-Medical): No  Physical Activity: Inactive (12/08/2020)   Exercise Vital Sign    Days of Exercise per Week: 0 days    Minutes of Exercise per Session: 0 min  Stress: No Stress Concern Present (12/08/2020)   [place]    Feeling of Stress : Not at all  Social Connections: Moderately Isolated (12/08/2020)   Social Connection and Isolation Panel [NHANES]    Frequency of Communication with Friends and Family: More than three times a week    Frequency of Social Gatherings with Friends and Family: More than three times a week    Attends Religious Services: 1 to 4 times per year    Active Member of [employer] or Organizations: No    Attends Archivist Meetings: Never    Marital Status: Divorced  Human resources officer Violence: Not At Risk (12/08/2020)   Humiliation, Afraid, Rape, and Kick questionnaire    Fear of Current or Ex-Partner: No    Emotionally Abused: No    Physically Abused: No    Sexually Abused: No      Marcial Pacas, M.D. Ph.D.  [facility] [address] [place], [place] [address] Ph: [phone] Fax: [phone]  CC:  Camillia Herter, NP [address],  [place] [address]  Camillia Herter, NP    Total time spent reviewing the chart, obtaining history, examined patient, ordering tests, documentation, consultations and family, care coordination was 56  mins

## 2022-05-07 ENCOUNTER — Telehealth: Payer: Self-pay | Admitting: Neurology

## 2022-05-07 NOTE — Telephone Encounter (Signed)
medicare/[employer] NPR sent to GI

## 2022-05-12 ENCOUNTER — Other Ambulatory Visit: Payer: Self-pay | Admitting: Interventional Cardiology

## 2022-05-14 ENCOUNTER — Other Ambulatory Visit: Payer: Self-pay

## 2022-05-14 MED ORDER — DIVALPROEX SODIUM ER 500 MG PO TB24: 500.0000 mg | ORAL_TABLET | Freq: Every evening | ORAL | 6 refills | Status: DC

## 2022-05-22 ENCOUNTER — Ambulatory Visit (INDEPENDENT_AMBULATORY_CARE_PROVIDER_SITE_OTHER): Payer: Medicare Other | Admitting: Neurology

## 2022-05-22 ENCOUNTER — Ambulatory Visit
Admission: RE | Admit: 2022-05-22 | Discharge: 2022-05-22 | Disposition: A | Discharge status: Home / Self Care | Payer: Medicare Other | Source: Ambulatory Visit | Attending: Neurology | Admitting: Neurology

## 2022-05-22 DIAGNOSIS — M62838 Other muscle spasm: Secondary | ICD-10-CM

## 2022-05-22 DIAGNOSIS — R258 Other abnormal involuntary movements: Secondary | ICD-10-CM

## 2022-05-22 DIAGNOSIS — R269 Unspecified abnormalities of gait and mobility: Secondary | ICD-10-CM | POA: Diagnosis not present

## 2022-05-22 MED ORDER — IOPAMIDOL (ISOVUE-300) INJECTION 61%
75.0000 mL | Freq: Once | INTRAVENOUS | Status: AC | PRN
  Administered 2022-05-22: 75 mL via INTRAVENOUS

## 2022-05-23 ENCOUNTER — Telehealth: Payer: Self-pay | Admitting: Pulmonary Disease

## 2022-05-24 NOTE — Telephone Encounter (Signed)
I have not received anything for this patient.

## 2022-05-24 NOTE — Telephone Encounter (Signed)
Checked Dr. Juanetta Gosling cubby and inbox up front. Did not see anything for this patient.   [name], have you received a CMN on this patient?

## 2022-05-25 ENCOUNTER — Encounter: Payer: Self-pay | Admitting: Physical Medicine and Rehabilitation

## 2022-05-25 ENCOUNTER — Encounter
Payer: Medicare Other | Attending: Physical Medicine and Rehabilitation | Admitting: Physical Medicine and Rehabilitation

## 2022-05-25 VITALS — BP 119/72 | HR 83 | Ht 65.5 in | Wt 214.4 lb

## 2022-05-25 DIAGNOSIS — M62838 Other muscle spasm: Secondary | ICD-10-CM | POA: Insufficient documentation

## 2022-05-25 DIAGNOSIS — M7918 Myalgia, other site: Secondary | ICD-10-CM | POA: Diagnosis present

## 2022-05-25 DIAGNOSIS — G8929 Other chronic pain: Secondary | ICD-10-CM | POA: Insufficient documentation

## 2022-05-25 DIAGNOSIS — G894 Chronic pain syndrome: Secondary | ICD-10-CM | POA: Diagnosis present

## 2022-05-25 DIAGNOSIS — M25511 Pain in right shoulder: Secondary | ICD-10-CM

## 2022-05-25 DIAGNOSIS — M25512 Pain in left shoulder: Secondary | ICD-10-CM | POA: Diagnosis present

## 2022-05-25 MED ORDER — TRIAMCINOLONE ACETONIDE 40 MG/ML IJ SUSP
40.0000 mg | Freq: Once | INTRAMUSCULAR | Status: AC
  Administered 2022-05-25: 80 mg via INTRAMUSCULAR

## 2022-05-25 MED ORDER — LIDOCAINE HCL 1 % IJ SOLN
10.0000 mL | Freq: Once | INTRAMUSCULAR | Status: AC
  Administered 2022-05-25: 5 mL

## 2022-05-25 NOTE — Progress Notes (Signed)
Patient is a 71 yr old R handed female with hx of  Bronchiesctasis, CHF, prediabetes- with A1c of 5.6- as of 02/15/21- - not on blood thinners, has a defibrillator, GERD, and fibromyalgia issues- dx'd 10 years ago.  Here for f/u on chronic pain and trigger oint injections. Last got subacromial shoulder injections B/L on 03/29/21.  S/P R TKR 10/22; also COVID B/L pneumonia now on O2 full time Here for B/L shoulder injections- last done 02/26/22- cannot be done before 3 months in future  Low back acting up again.  Would like trigger point injections just above "butt".   Having cramps in feet and "posturing in hands"- put on seizure medications.  Got CT  brain- cannot get MRI due to clamp on brain from prior aneurysm.  Also did EEG- and CT scan of neck.   I looked over results and independent review  of CT of cervical spine- shows C5/6 fusion from ankylosing spondylitis and some mild stenosis.  Cannot access EEG results.  CT of head shows nothing acute- just prior clipping from aneurysm.    Also told has a babinski. Asked to not do again- everything was occurring on L side. At least worse on L side.     Exam: Awake, alert, appropriate, no signs of neurological issues/seizure on exam No clonus at wrist or ankle B/L No increase tone in UE or LE's - also no spasms seen No hoffman's B/L  Just prior babinksi- declined to do today TTP in low back ~ L5- in band across low back   Plan: steroid injection was performed at subacromial steroid injections B/L using 1% plain Lidocaine and '40mg'$  /1cc of Kenalog. This was well tolerated.  Cleaned with betadine x3 and allowed to dry- then alcohol then injected using 27 gauge 1.5 inch needle- no bleeding or complications.    F/U in 3 months for steroid injections of shoulders//subacromial endons.  Lidocaine will kick in 15 minutes- and wear off tonight- the steroid will kick in tomorrow within 24 hours and take up to 72 hours to fully kick in.   2.  Patient here for trigger point injections for myofascial pain   Consent done and on chart.  Cleaned areas with alcohol and injected using a 27 gauge 1.5 inch needle  Injected  3cc- wasted 3cc Using 1% Lidocaine with no EPI  Upper traps B/L  Levators B/L  Posterior scalenes Middle scalenes- B/L  Splenius Capitus Pectoralis Major Rhomboids- B/L  Infraspinatus Teres Major/minor Thoracic paraspinals Lumbar paraspinals- B/L  Other injections-    Patient's level of pain prior was 7-8/10 Current level of pain after injections is improved ROM of cervical spine There was no bleeding or complications. Some better with pain  Patient was advised to drink a lot of water on day after injections to flush system Will have increased soreness for 12-48 hours after injections.  Can use Lidocaine patches the day AFTER injections Can use theracane on day of injections in places didn't inject Can use heating pad 4-6 hours AFTER injections   3. Con't Baclofen, tramadol- doesn't need refills  4. F/U in 3 months- after 08/25/22  5. Wait on R hip xray for now, but call me if you want.    I spent a total of 25   minutes on total care today- >50% coordination of care- due to 5 minutes on Steroid injection  and 10 minutes on f/u. And 10 minutes on trigger point injections.

## 2022-05-25 NOTE — Patient Instructions (Signed)
Exam: Awake, alert, appropriate, no signs of neurological issues/seizure on exam No clonus at wrist or ankle B/L No increase tone in UE or LE's - also no spasms seen No hoffman's B/L  Just prior babinksi- declined to do today TTP in low back ~ L5- in band across low back   Plan: steroid injection was performed at subacromial steroid injections B/L using 1% plain Lidocaine and '40mg'$  /1cc of Kenalog. This was well tolerated.  Cleaned with betadine x3 and allowed to dry- then alcohol then injected using 27 gauge 1.5 inch needle- no bleeding or complications.    F/U in 3 months for steroid injections of shoulders//subacromial endons.  Lidocaine will kick in 15 minutes- and wear off tonight- the steroid will kick in tomorrow within 24 hours and take up to 72 hours to fully kick in.   2. Patient here for trigger point injections for myofascial pain   Consent done and on chart.  Cleaned areas with alcohol and injected using a 27 gauge 1.5 inch needle  Injected  3cc- wasted 3cc Using 1% Lidocaine with no EPI  Upper traps B/L  Levators B/L  Posterior scalenes Middle scalenes- B/L  Splenius Capitus Pectoralis Major Rhomboids- B/L  Infraspinatus Teres Major/minor Thoracic paraspinals Lumbar paraspinals- B/L  Other injections-    Patient's level of pain prior was 7-8/10 Current level of pain after injections is improved ROM of cervical spine There was no bleeding or complications. Some better with pain  Patient was advised to drink a lot of water on day after injections to flush system Will have increased soreness for 12-48 hours after injections.  Can use Lidocaine patches the day AFTER injections Can use theracane on day of injections in places didn't inject Can use heating pad 4-6 hours AFTER injections   3. Con't Baclofen, tramadol- doesn't need refills  4. F/U in 3 months- after 08/25/22  5. Wait on R hip xray for now, but call me if you want.

## 2022-05-28 ENCOUNTER — Ambulatory Visit (INDEPENDENT_AMBULATORY_CARE_PROVIDER_SITE_OTHER): Payer: Medicare Other | Admitting: Podiatry

## 2022-05-28 ENCOUNTER — Encounter: Payer: Self-pay | Admitting: Podiatry

## 2022-05-28 DIAGNOSIS — L84 Corns and callosities: Secondary | ICD-10-CM

## 2022-05-29 ENCOUNTER — Encounter: Payer: Self-pay | Admitting: Neurology

## 2022-05-29 NOTE — Progress Notes (Signed)
Subjective:   Patient ID: [name], female   DOB: 71 y.o.   MRN: [id number]   HPI Patient presents with a painful calluses of both feet that makes it hard to walk   ROS      Objective:  Physical Exam  There are status intact keratotic lesion bilateral painful     Assessment:  Lesion formation bilateral with pain     Plan:  Sterile debridement of all lesions no angiogenic bleeding reappoint routine care

## 2022-05-30 ENCOUNTER — Telehealth: Payer: Self-pay

## 2022-05-30 ENCOUNTER — Ambulatory Visit (INDEPENDENT_AMBULATORY_CARE_PROVIDER_SITE_OTHER): Payer: Medicare Other

## 2022-05-30 DIAGNOSIS — I428 Other cardiomyopathies: Secondary | ICD-10-CM | POA: Diagnosis not present

## 2022-05-30 LAB — CUP PACEART REMOTE DEVICE CHECK
Battery Remaining Longevity: 18 mo
Battery Remaining Percentage: 20 %
Brady Statistic RV Percent Paced: 0 %
Date Time Interrogation Session: 20231011030100
HighPow Impedance: 74 Ohm
Implantable Lead Implant Date: 20050603
Implantable Lead Location: 753860
Implantable Lead Model: 185
Implantable Lead Serial Number: 116340
Implantable Pulse Generator Implant Date: 20110505
Lead Channel Impedance Value: 628 Ohm
Lead Channel Pacing Threshold Amplitude: 0.6 V
Lead Channel Pacing Threshold Pulse Width: 0.4 ms
Lead Channel Setting Pacing Amplitude: 2.4 V
Lead Channel Setting Pacing Pulse Width: 0.4 ms
Lead Channel Setting Sensing Sensitivity: 0.4 mV
Pulse Gen Serial Number: 266301

## 2022-05-30 NOTE — Telephone Encounter (Signed)
Patient sent a MyChart message inquiring about results.  I discussed her CT head and CT cervical spine results.  Patient was wondering what the EEG results were.  It does not appear that the EEG has been read yet.  Patient is wondering if she should continue the Depakote ER 500 mg at night.  I advised her to wait until the EEG results are read and we can discuss that then.  Patient verbalized understanding.

## 2022-05-31 NOTE — Procedures (Signed)
   HISTORY: 71 year old female with history of right brain aneurysm clip with intermittent left hand posturing  TECHNIQUE:  This is a routine 16 channel EEG recording with one channel devoted to a limited EKG recording.  It was performed during wakefulness, drowsiness and asleep.  Hyperventilation and photic stimulation were performed as activating procedures.  There are minimum muscle and movement artifact noted.  Upon maximum arousal, posterior dominant waking rhythm consistent of rhythmic alpha range activity. Activities are symmetric over the bilateral posterior derivations and attenuated with eye opening.  Hyperventilation produced mild/moderate buildup with higher amplitude and the slower activities noted.  Photic stimulation did not alter the tracing.  During EEG recording, patient developed drowsiness and no deeper stage of sleep was achieved During EEG recording, there was no epileptiform discharge noted.  EKG demonstrate sinus rhythm, with heart rate of 64 bpm.  CONCLUSION: This is a  normal awake EEG.  There is no electrodiagnostic evidence of epileptiform discharge.  Marcial Pacas, M.D. Ph.D.  [facility] [address], [place] [address] Phone: [phone] Fax:      [phone]

## 2022-06-08 ENCOUNTER — Ambulatory Visit (INDEPENDENT_AMBULATORY_CARE_PROVIDER_SITE_OTHER): Payer: Medicare Other | Admitting: Pulmonary Disease

## 2022-06-08 DIAGNOSIS — J849 Interstitial pulmonary disease, unspecified: Secondary | ICD-10-CM | POA: Diagnosis not present

## 2022-06-08 LAB — PULMONARY FUNCTION TEST
DL/VA % pred: 93 %
DL/VA: 3.77 ml/min/mmHg/L
DLCO cor % pred: 62 %
DLCO cor: 12.95 ml/min/mmHg
DLCO unc % pred: 62 %
DLCO unc: 12.95 ml/min/mmHg
FEF 25-75 Post: 4.42 L/sec
FEF 25-75 Pre: 3.5 L/sec
FEF2575-%Change-Post: 26 %
FEF2575-%Pred-Post: 233 %
FEF2575-%Pred-Pre: 185 %
FEV1-%Change-Post: 1 %
FEV1-%Pred-Post: 91 %
FEV1-%Pred-Pre: 90 %
FEV1-Post: 2.2 L
FEV1-Pre: 2.17 L
FEV1FVC-%Change-Post: 0 %
FEV1FVC-%Pred-Pre: 123 %
FEV6-%Change-Post: 0 %
FEV6-%Pred-Post: 77 %
FEV6-%Pred-Pre: 77 %
FEV6-Post: 2.35 L
FEV6-Pre: 2.33 L
FEV6FVC-%Pred-Post: 104 %
FEV6FVC-%Pred-Pre: 104 %
FVC-%Change-Post: 1 %
FVC-%Pred-Post: 74 %
FVC-%Pred-Pre: 73 %
FVC-Post: 2.36 L
FVC-Pre: 2.33 L
Post FEV1/FVC ratio: 93 %
Post FEV6/FVC ratio: 100 %
Pre FEV1/FVC ratio: 93 %
Pre FEV6/FVC Ratio: 100 %
RV % pred: 55 %
RV: 1.32 L
TLC % pred: 69 %
TLC: 3.76 L

## 2022-06-08 NOTE — Progress Notes (Signed)
Full PFT performed today. °

## 2022-06-08 NOTE — Patient Instructions (Signed)
Full PFT performed today. °

## 2022-06-11 ENCOUNTER — Encounter: Payer: Self-pay | Admitting: *Deleted

## 2022-06-12 NOTE — Progress Notes (Signed)
Remote ICD transmission.   

## 2022-06-14 ENCOUNTER — Ambulatory Visit
Admission: RE | Admit: 2022-06-14 | Discharge: 2022-06-14 | Disposition: A | Discharge status: Home / Self Care | Payer: Medicare Other | Source: Ambulatory Visit | Attending: Family | Admitting: Family

## 2022-06-14 DIAGNOSIS — Z13820 Encounter for screening for osteoporosis: Secondary | ICD-10-CM

## 2022-06-14 DIAGNOSIS — M816 Localized osteoporosis [Lequesne]: Secondary | ICD-10-CM

## 2022-06-16 ENCOUNTER — Encounter: Payer: Self-pay | Admitting: Neurology

## 2022-06-20 NOTE — Progress Notes (Signed)
Patient ID: [name], female    DOB: [birth year]  MRN: [id number]  CC: Prediabetes   Subjective: [name] is a 71 y.o. female who presents for prediabetes.   Her concerns today include:  None.   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   Localized swelling of right lower leg 08/29/2021   Tonsillar exudate 08/29/2021   Candida vaginitis 08/28/2021   Chronic pain syndrome 08/25/2021   Multifocal pneumonia 06/03/2021   Pre-operative respiratory examination 05/18/2021   Statin myopathy 04/19/2021   Pain in joint of right shoulder 02/10/2021   Arthritis of both acromioclavicular joints 01/02/2021   Trochanteric bursitis of right hip 10/03/2020   Tailbone injury 05/05/2020   UTI (urinary tract infection) 05/05/2020   Insomnia 05/05/2020   Leg cramps 02/16/2020   Dysuria 02/16/2020   Right hip impingement syndrome 02/01/2020   Right bicipital tenosynovitis 02/01/2020   Bronchiectasis without complication ([place]) 62/83/1517   Myofascial pain 12/21/2019   Estrogen deficiency 12/10/2019   Obesity (BMI 30-39.9) 12/10/2019   Bronchiectasis with (acute) exacerbation ([place]) 10/23/2019   Elevated LFTs 10/15/2019   Lower abdominal pain 09/01/2019   Neck pain 10/25/2017   Paresthesias in left hand 10/25/2017   Paresthesia of foot, bilateral 04/12/2017   Primary osteoarthritis of both hands 09/27/2016   Primary osteoarthritis of both knees 09/27/2016   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   Nasopharyngitis, 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/2016   S/P total knee replacement 02/13/2016   Chronic pain of both knees 12/06/2015   Globus pharyngeus 05/17/2015   Genetic testing 12/23/2014   Family history of breast cancer    Family history of colon cancer    Family history of pancreatic cancer    History of  breast cancer 11/23/2014   Allergy to ACE inhibitors 09/27/2014   Prediabetes 06/23/2013   RLS (restless legs syndrome) 01/15/2013   Hx of Clostridium difficile infection 10/10/2012   Dyspnea on exertion 04/02/2012   NICM (nonischemic cardiomyopathy) ([place]) 03/07/2012   Post-menopausal 01/11/2012   Atypical chest pain 11/07/2011   Obstructive sleep apnea 04/04/2010   V-tach ([place]) 01/03/2009   ICD  [employer]  Single chamber 01/03/2009   PFO (patent foramen ovale) 09/29/2008   Vitamin D deficiency 08/17/2008   Hypothyroidism 11/18/2006   Hyperlipidemia 11/18/2006   Depression with anxiety 11/18/2006   Essential hypertension 11/18/2006   Mitral valve prolapse 11/18/2006   Cerebral aneurysm 11/18/2006   Allergic rhinitis 11/18/2006   GERD 11/18/2006   Diverticulosis of colon 11/18/2006   Fatty liver 11/18/2006   Fibromyalgia 11/18/2006     Current Outpatient Medications on File Prior to Visit  Medication Sig Dispense Refill   acetaminophen (TYLENOL) 500 MG tablet Take 500 mg by mouth every 6 (six) hours as needed for moderate pain.     albuterol (PROVENTIL) (2.5 MG/3ML) 0.083% nebulizer solution USE 1 VIAL IN NEBULIZER EVERY 6 HOURS (Patient taking differently: Take 2.5 mg by nebulization every 6 (six) hours as needed for shortness of breath.) 120 mL 11   albuterol (VENTOLIN HFA) 108 (90 Base) MCG/ACT inhaler Inhale 2 puffs into the lungs every 6 (six) hours as needed for wheezing or shortness of breath. 18 g 3   Ascorbic Acid (VITAMIN C) 1000 MG tablet Take 1,000 mg by mouth daily.     aspirin EC 81 MG  tablet Take 81 mg by mouth daily. Swallow whole.     azelastine (ASTELIN) 0.1 % nasal spray Place 1 spray into both nostrils 2 (two) times daily. Use in each nostril as directed 90 mL 3   baclofen (LIORESAL) 10 MG tablet Take 1 tablet (10 mg total) by mouth at bedtime as needed for muscle spasms. 90 tablet 1   budesonide-formoterol (SYMBICORT) 80-4.5 MCG/ACT inhaler Inhale 1  puff into the lungs 2 (two) times daily. 1 each 12   cefUROXime (CEFTIN) 500 MG tablet Take 1 tablet (500 mg total) by mouth 2 (two) times daily with a meal. 14 tablet 0   cetirizine (ZYRTEC) 10 MG tablet Take 10 mg by mouth daily as needed for allergies.     Cholecalciferol (DIALYVITE VITAMIN D 5000) 125 MCG (5000 UT) capsule Take 5,000 Units by mouth daily.     diclofenac Sodium (VOLTAREN) 1 % GEL Apply 2-4 grams to affected joint 4 times daily as needed. (Patient taking differently: Apply 2-4 g topically 4 (four) times daily as needed (joint pain).) 400 g 2   diltiazem (CARDIZEM CD) 120 MG 24 hr capsule Take 1 capsule (120 mg total) by mouth daily. Do Not start this medication until after you wean off Carvedilol. 90 capsule 2   Empagliflozin (JARDIANCE PO) Take by mouth.     ezetimibe (ZETIA) 10 MG tablet Take 1 tablet (10 mg total) by mouth daily. 90 tablet 3   famotidine (PEPCID) 40 MG tablet Take 1 tablet (40 mg total) by mouth at bedtime. 360 tablet 6   fexofenadine (ALLEGRA) 180 MG tablet Take 180 mg by mouth daily as needed for allergies (alternates with zyrtec sometimes).     fluticasone (FLONASE) 50 MCG/ACT nasal spray Place 2 sprays into both nostrils daily as needed for allergies or rhinitis. 48 g 3   FOLIC ACID PO Take 1 tablet by mouth daily.     furosemide (LASIX) 20 MG tablet TAKE 2 TABLETS (40 MG) 3 DAYS PER WEEK AND TAKE 1 TABLET (20 MG) THE OTHER DAYS OF THE WEEK 130 tablet 1   gabapentin (NEURONTIN) 100 MG capsule Take 1 capsule (100 mg total) by mouth 3 (three) times daily. 90 capsule 6   gabapentin (NEURONTIN) 300 MG capsule Take 1 capsule (300 mg total) by mouth at bedtime. 30 capsule 2   hydroxypropyl methylcellulose / hypromellose (ISOPTO TEARS / GONIOVISC) 2.5 % ophthalmic solution Place 1 drop into both eyes at bedtime.     latanoprost (XALATAN) 0.005 % ophthalmic solution Place 1 drop into both eyes at bedtime.     levETIRAcetam (KEPPRA) 500 MG tablet Take 1 tablet (500  mg total) by mouth 2 (two) times daily. 60 tablet 5   levothyroxine (SYNTHROID) 50 MCG tablet TAKE 1 TABLET EVERY DAY 90 tablet 1   magnesium oxide (MAG-OX) 400 MG tablet Take 1 tablet (400 mg total) by mouth 2 (two) times daily. 180 tablet 3   mometasone (ELOCON) 0.1 % lotion Apply 1 application topically daily as needed (rash). 60 mL 1   montelukast (SINGULAIR) 10 MG tablet TAKE 1 TABLET AT BEDTIME 90 tablet 3   Multiple Vitamins-Minerals (MULTIVITAMIN PO) Take 1 tablet by mouth daily.     nitroGLYCERIN (NITROSTAT) 0.4 MG SL tablet DISSOLVE ONE TABLET UNDER THE TONGUE EVERY 5 MINUTES AS NEEDED FOR CHEST PAIN.  DO NOT EXCEED A TOTAL OF 3 DOSES IN 15 MINUTES (Patient taking differently: Place 0.4 mg under the tongue every 5 (five) minutes as needed for  chest pain.) 25 tablet 0   nystatin (MYCOSTATIN) 100000 UNIT/ML suspension Take 5 mLs (500,000 Units total) by mouth 4 (four) times daily. 60 mL 0   pantoprazole (PROTONIX) 40 MG tablet Take 1 tablet (40 mg total) by mouth daily. 90 tablet 3   polyethylene glycol powder (GLYCOLAX/MIRALAX) powder Take 17 grams by mouth daily 1080 g 0   potassium chloride (KLOR-CON M) 10 MEQ tablet TAKE 1/2 TABLET EVERY DAY (CALL OFFICE TO SCHEDULE YEARLY APPT) 30 tablet 11   Respiratory Therapy Supplies (FLUTTER) DEVI 1 Device by Does not apply route as needed. 1 each 0   RSV vaccine recomb adjuvanted (AREXVY) 120 MCG/0.5ML injection Inject into the muscle. 0.5 mL 0   sodium chloride HYPERTONIC 3 % nebulizer solution Take by nebulization 2 (two) times daily as needed for cough. 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 750 mL 12   traMADol (ULTRAM) 50 MG tablet Take 1 tablet (50 mg total) by mouth every 12 (twelve) hours as needed. 180 tablet 1   traZODone (DESYREL) 100 MG tablet TAKE 1 TO 1 AND 1/2 TABLETS AT BEDTIME 135 tablet 1   No current facility-administered medications on file prior to visit.    Allergies  Allergen Reactions   Ace Inhibitors Swelling    Angioedema; makes  tongue "break out"    Amitriptyline Hcl Other (See Comments)     makes her too sleepy!   Atorvastatin Other (See Comments)    SEVERE MYALGIA   Cymbalta [Duloxetine Hcl] Nausea Only and Other (See Comments)    Sleepiness/ sick   Dilantin [Phenytoin Sodium Extended] Rash    Severe rash   Paroxetine Nausea Only    Rapid heartbeat   Ramipril Other (See Comments)    TONGUE ULCERS    Rosuvastatin Other (See Comments)    SEVERE MYALGIA   Carbamazepine Rash   Codeine Itching   Paxlovid [Nirmatrelvir-Ritonavir] Itching   Phenytoin Sodium Extended Rash   Simvastatin Other (See Comments)    Increase in CK, myalgias   Wellbutrin [Bupropion] Palpitations    Social History   Socioeconomic History   Marital status: Divorced    Spouse name: Not on file   Number of children: 2   Years of education: 14   Highest education level: Not on file  Occupational History   Occupation: DISABLED  Tobacco Use   Smoking status: Former    Packs/day: 1.00    Years: 15.00    Total pack years: 15.00    Types: Cigarettes    Quit date: 08/20/1989    Years since quitting: 32.8    Passive exposure: Past   Smokeless tobacco: Never  Vaping Use   Vaping Use: Never used  Substance and Sexual Activity   Alcohol use: Yes    Alcohol/week: 3.0 standard drinks of alcohol    Types: 3 Glasses of wine per week    Comment: occasionally, not on a weekly basis   Drug use: Yes    Frequency: 1.0 times per week    Types: Marijuana    Comment: CBD gummy occ; marijuana occasionally   Sexual activity: Not Currently  Other Topics Concern   Not on file  Social History Narrative   Lives alone   Caffeine IWL:NLGX   Retired from [employer]   2 children -- 5 grandbabies - 1 great-grandbaby    Social Determinants of Health   Financial Resource Strain: Low Risk  (12/08/2020)   Overall Financial Resource Strain (CARDIA)    Difficulty of Paying Living Expenses: Not  hard at all  Food Insecurity: No Food Insecurity  (12/08/2020)   Hunger Vital Sign    Worried About Running Out of Food in the Last Year: Never true    Ran Out of Food in the Last Year: Never true  Transportation Needs: No Transportation Needs (12/08/2020)   PRAPARE - Hydrologist (Medical): No    Lack of Transportation (Non-Medical): No  Physical Activity: Inactive (12/08/2020)   Exercise Vital Sign    Days of Exercise per Week: 0 days    Minutes of Exercise per Session: 0 min  Stress: No Stress Concern Present (12/08/2020)   [place]    Feeling of Stress : Not at all  Social Connections: Moderately Isolated (12/08/2020)   Social Connection and Isolation Panel [NHANES]    Frequency of Communication with Friends and Family: More than three times a week    Frequency of Social Gatherings with Friends and Family: More than three times a week    Attends Religious Services: 1 to 4 times per year    Active Member of [employer] or Organizations: No    Attends Archivist Meetings: Never    Marital Status: Divorced  Human resources officer Violence: Not At Risk (12/08/2020)   Humiliation, Afraid, Rape, and Kick questionnaire    Fear of Current or Ex-Partner: No    Emotionally Abused: No    Physically Abused: No    Sexually Abused: No    Family History  Problem Relation Age of Onset   Diabetes Mother    Alzheimer's disease Mother    Hypertension Mother    Obesity Mother    Heart disease Father    Seizures Sister    Allergies Sister    Heart disease Sister    Other Brother        Thyroid problem 10/2016   Heart disease Brother    Prostate cancer Brother 34       same brother as throat cancer   Throat cancer Brother        dx in his 49s; also a smoker   Cancer Maternal Grandmother 44       colon cancer or abdominal cancer   Lung cancer Maternal Grandfather 78   Heart disease Son        Cardiac Arrest 07/2016   Breast cancer Maternal Aunt  55   Colon cancer Maternal Aunt 61       same sister as breast at 57   Breast cancer Maternal Aunt        dx in her 20s   Lung cancer Maternal Uncle    Pancreatic cancer Cousin 81       maternal first cousin   Breast cancer Cousin        paternal first cousin twice removed died in her 70s   Stroke Neg Hx     Past Surgical History:  Procedure Laterality Date   ABI  2006   normal   BREAST EXCISIONAL BIOPSY     BREAST LUMPECTOMY Right 2016   BREAST LUMPECTOMY WITH RADIOACTIVE SEED LOCALIZATION Right 01/03/2015   Procedure: BREAST LUMPECTOMY WITH RADIOACTIVE SEED LOCALIZATION;  Surgeon: Autumn Messing III, MD;  Location: [facility];  Service: General;  Laterality: Right;   CARDIAC DEFIBRILLATOR PLACEMENT  2006; 2012   BSX single chamber ICD   carotid dopplers  2006   neg   CEREBRAL ANEURYSM REPAIR  02/1999   COLONOSCOPY  HAMMER TOE SURGERY  11/15/2020   3rd digit bilateral feet    hospitalization  2004   GI bleed, PUD, diverticulosis (EGD,colonscopy)   hospitalization     PE, NSVT, s/p defib   KNEE ARTHROSCOPY     bilateral   LEFT HEART CATHETERIZATION WITH CORONARY ANGIOGRAM N/A 02/22/2012   Procedure: LEFT HEART CATHETERIZATION WITH CORONARY ANGIOGRAM;  Surgeon: Burnell Blanks, MD;  Location[facility] C[facility] LAB;  Service: Cardiovascular;  Laterality: N/A;   PARTIAL HYSTERECTOMY     Fibroids   TONGUE BIOPSY  12/12/2017   due to sore tongue and white patches/abnormal cells   TOTAL KNEE ARTHROPLASTY Left 02/13/2016   Procedure: TOTAL KNEE ARTHROPLASTY;  Surgeon: Vickey Huger, MD;  Location: [facility];  Service: Orthopedics;  Laterality: Left;   TOTAL KNEE ARTHROPLASTY Right 05/22/2021   Procedure: TOTAL KNEE ARTHROPLASTY;  Surgeon: Vickey Huger, MD;  Location: [facility];  Service: Orthopedics;  Laterality: Right;  with block   UPPER GASTROINTESTINAL ENDOSCOPY      ROS: Review of Systems Negative except as stated above  PHYSICAL EXAM: BP 124/78 (BP Location: Left Arm, Patient  Position: Sitting, Cuff Size: Large)   Pulse 74   Temp 98.3 F (36.8 C)   Resp 16   Ht 5' 6.5" (1.689 m)   Wt 210 lb (95.3 kg)   LMP  (LMP Unknown)   SpO2 96%   BMI 33.39 kg/m   Physical Exam Eyes:     Extraocular Movements: Extraocular movements intact.     Conjunctiva/sclera: Conjunctivae normal.     Pupils: Pupils are equal, round, and reactive to light.  Cardiovascular:     Rate and Rhythm: Normal rate and regular rhythm.     Pulses: Normal pulses.     Heart sounds: Normal heart sounds.  Pulmonary:     Effort: Pulmonary effort is normal.     Breath sounds: Normal breath sounds.  Musculoskeletal:     Cervical back: Normal range of motion and neck supple.  Neurological:     General: No focal deficit present.     Mental Status: She is alert and oriented to person, place, and time.  Psychiatric:        Mood and Affect: Mood normal.        Behavior: Behavior normal.     ASSESSMENT AND PLAN: 1. Prediabetes - Hemoglobin A1c result pending.  - Follow-up with primary provider as scheduled.  - Hemoglobin A1c   Patient was given the opportunity to ask questions.  Patient verbalized understanding of the plan and was able to repeat key elements of the plan. Patient was given clear instructions to go to Emergency Department or return to medical center if symptoms don't improve, worsen, or new problems develop.The patient verbalized understanding.   Orders Placed This Encounter  Procedures   Hemoglobin A1c    Return in about 6 months (around 12/26/2022) for Follow-Up or next available.  Camillia Herter, NP

## 2022-06-20 NOTE — Progress Notes (Unsigned)
Patient: [name] Date of Birth: Jul 12, 1951  Reason for Visit: Follow up History from: Patient Primary Neurologist: Krista Blue   ASSESSMENT AND PLAN 71 y.o. year old female   48.  History of right brain aneurysm, s/p aneurysm clipping in 2001 2.  Intermittent left hand posturing 3.  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, cervical fusion C5-6, evidence of foraminal impingement at right C4-5, left C3-4 -Order NCV/EMG to better localize symptoms (gait, spasms BLE) -Side effect with Depakote, will switch to Keppra 500 mg twice a day, high risk for partial seizure given right frontotemporal craniotomy changes (no further left hand posturing) -EEG was normal October 2023 -CT cervical spine October 2023 showed foraminal impingement on the left at C3-4 and right at C4-5 -CT head October 2023 showed changes of right frontal craniotomy area of encephalomalacia -Has baclofen, encouraged to try at bedtime for muscle spasms  HISTORY  Dr. Krista Blue 05/04/22: [name] is a 71 year old female, seen in request by primary care nurse practitioner Minette Brine, Amy J, for evaluation of frequent left lower extremity muscle spasm, she was a patient of Dr. Jannifer Franklin, seen by Judson Roch in August 2021   I reviewed and summarized the referring note. PMHX. HLD CHF Stroke Breast Cancer, right lobectomy, radiation therapy in 2016 Cerebral aneurysm, s/p clip in 2001, History of right knee replacement in August 2022, left side was in 2016.   She has a history of right brain aneurysm, status post clipping in 2001, denies residual deficit, but reported a history of allergic reaction to Dilantin/Tegretol, developed a rash, has been off epileptic medications for many years, but she reported over the years, she has intermittent left hand posturing, she has no control over it for few minutes, denies loss of consciousness, denied generalized seizure activity   She is tearful during today's visit, she lost her friends of many  years, lives alone, still attends elderly patient in their home,   She has chronic neck pain, getting worse over the past few months, since the beginning of 2023, she complains of frequent bilateral lower extremity muscle spasm, toes go up, only happened early morning when she first get up from overnight sleep, and can be very painful, she put mustard seed at her nightstand, she has to pace around to alleviate the symptoms   She also complains of worsening urinary incontinence, urgency, sometimes cannot make it to the bathroom   Personally reviewed CT cervical spine in June 2019: Chronic C5-6 solid arthrodesis/ankylosis, chronic thoracic spine ankylosis at T2-3, progressive upper cervical disc endplate and facet degeneration moderate spinal stenosis C4-5, mild C3-4, variable degree of foraminal narrowing   I was not able to [place], CT head without contrast by neuro surgeon Dr. Glenna Fellows in July 2000, aneurysm clip is demonstrated at the skull base on the right with post craniotomy changes, in the right frontotemporal region,   Repeat CT head February 2001 postoperative change of the right has resolved, ventricular system is normal, surgical clip on the right, no acute abnormality   Laboratory evaluation August 2023, normal vitamin D, BMP, creatinine 0.86, A1c 5.7, CPK, CBC, mild elevated alkaline phosphate 122, creatinine 0.89  Update June 21, 2022 SS: EEG was normal October 2023. Last visit, Dr. Krista Blue started Depakote ER 500 mg, she feels tremor in her hands, feels nervous. No further left hand posturing. 1st thing in the morning, cramps in feet, her foot twisted out, is painful, takes mustard. Happens 3 times a week.   CT cervical spine  05/22/22 IMPRESSION: 1. Generalized cervical spine degeneration with up to mild spinal stenosis at C4-5. 2. Foraminal impingement on the left at C3-4 and right at C4-5. 3. C2-3 facet ankylosis since 2019.  Solid fusion at C5-6.   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  No acute abnormalities noted.  No abnormal areas of enhancement are noted.  REVIEW OF SYSTEMS: Out of a complete 14 system review of symptoms, the patient complains only of the following symptoms, and all other reviewed systems are negative.  See HPI  ALLERGIES: Allergies  Allergen Reactions   Ace Inhibitors Swelling    Angioedema; makes tongue "break out"    Amitriptyline Hcl Other (See Comments)     makes her too sleepy!   Atorvastatin Other (See Comments)    SEVERE MYALGIA   Cymbalta [Duloxetine Hcl] Nausea Only and Other (See Comments)    Sleepiness/ sick   Dilantin [Phenytoin Sodium Extended] Rash    Severe rash   Paroxetine Nausea Only    Rapid heartbeat   Ramipril Other (See Comments)    TONGUE ULCERS    Rosuvastatin Other (See Comments)    SEVERE MYALGIA   Carbamazepine Rash   Codeine Itching   Paxlovid [Nirmatrelvir-Ritonavir] Itching   Phenytoin Sodium Extended Rash   Simvastatin Other (See Comments)    Increase in CK, myalgias   Wellbutrin [Bupropion] Palpitations    HOME MEDICATIONS: Outpatient Medications Prior to Visit  Medication Sig Dispense Refill   acetaminophen (TYLENOL) 500 MG tablet Take 500 mg by mouth every 6 (six) hours as needed for moderate pain.     albuterol (PROVENTIL) (2.5 MG/3ML) 0.083% nebulizer solution USE 1 VIAL IN NEBULIZER EVERY 6 HOURS (Patient taking differently: Take 2.5 mg by nebulization every 6 (six) hours as needed for shortness of breath.) 120 mL 11   albuterol (VENTOLIN HFA) 108 (90 Base) MCG/ACT inhaler Inhale 2 puffs into the lungs every 6 (six) hours as needed for wheezing or shortness of breath. 18 g 3   Ascorbic Acid (VITAMIN C) 1000 MG tablet Take 1,000 mg by mouth daily.     aspirin EC 81 MG tablet  Take 81 mg by mouth daily. Swallow whole.     azelastine (ASTELIN) 0.1 % nasal spray Place 1 spray into both nostrils 2 (two) times daily. Use in each nostril as directed 90 mL 3   baclofen (LIORESAL) 10 MG tablet Take 1 tablet (10 mg total) by mouth at bedtime as needed for muscle spasms. 90 tablet 1   budesonide-formoterol (SYMBICORT) 80-4.5 MCG/ACT inhaler Inhale 1 puff into the lungs 2 (two) times daily. 1 each 12   cetirizine (ZYRTEC) 10 MG tablet Take 10 mg by mouth daily as needed for allergies.     Cholecalciferol (DIALYVITE VITAMIN D 5000) 125 MCG (5000 UT) capsule Take 5,000 Units by mouth daily.     diclofenac Sodium (VOLTAREN) 1 % GEL Apply 2-4 grams to affected joint 4 times daily as needed. (Patient taking differently: Apply 2-4 g topically 4 (four) times daily as needed (joint pain).) 400 g 2   diltiazem (CARDIZEM CD) 120 MG 24 hr capsule Take 1 capsule (120 mg total) by mouth daily. Do Not start this medication until after you wean off Carvedilol. 90 capsule 2   Empagliflozin (JARDIANCE PO) Take by mouth.  ezetimibe (ZETIA) 10 MG tablet Take 1 tablet (10 mg total) by mouth daily. 90 tablet 3   famotidine (PEPCID) 40 MG tablet Take 1 tablet (40 mg total) by mouth at bedtime. 360 tablet 6   fexofenadine (ALLEGRA) 180 MG tablet Take 180 mg by mouth daily as needed for allergies (alternates with zyrtec sometimes).     fluticasone (FLONASE) 50 MCG/ACT nasal spray Place 2 sprays into both nostrils daily as needed for allergies or rhinitis. 48 g 3   FOLIC ACID PO Take 1 tablet by mouth daily.     furosemide (LASIX) 20 MG tablet TAKE 2 TABLETS (40 MG) 3 DAYS PER WEEK AND TAKE 1 TABLET (20 MG) THE OTHER DAYS OF THE WEEK 130 tablet 1   gabapentin (NEURONTIN) 100 MG capsule Take 1 capsule (100 mg total) by mouth 3 (three) times daily. 90 capsule 6   gabapentin (NEURONTIN) 300 MG capsule Take 1 capsule (300 mg total) by mouth at bedtime. 30 capsule 2   hydroxypropyl methylcellulose /  hypromellose (ISOPTO TEARS / GONIOVISC) 2.5 % ophthalmic solution Place 1 drop into both eyes at bedtime.     latanoprost (XALATAN) 0.005 % ophthalmic solution Place 1 drop into both eyes at bedtime.     levothyroxine (SYNTHROID) 50 MCG tablet TAKE 1 TABLET EVERY DAY 90 tablet 1   magnesium oxide (MAG-OX) 400 MG tablet Take 1 tablet (400 mg total) by mouth 2 (two) times daily. 180 tablet 3   mometasone (ELOCON) 0.1 % lotion Apply 1 application topically daily as needed (rash). 60 mL 1   montelukast (SINGULAIR) 10 MG tablet TAKE 1 TABLET AT BEDTIME 90 tablet 3   Multiple Vitamins-Minerals (MULTIVITAMIN PO) Take 1 tablet by mouth daily.     nitroGLYCERIN (NITROSTAT) 0.4 MG SL tablet DISSOLVE ONE TABLET UNDER THE TONGUE EVERY 5 MINUTES AS NEEDED FOR CHEST PAIN.  DO NOT EXCEED A TOTAL OF 3 DOSES IN 15 MINUTES (Patient taking differently: Place 0.4 mg under the tongue every 5 (five) minutes as needed for chest pain.) 25 tablet 0   nystatin (MYCOSTATIN) 100000 UNIT/ML suspension Take 5 mLs (500,000 Units total) by mouth 4 (four) times daily. 60 mL 0   pantoprazole (PROTONIX) 40 MG tablet Take 1 tablet (40 mg total) by mouth daily. 90 tablet 3   polyethylene glycol powder (GLYCOLAX/MIRALAX) powder Take 17 grams by mouth daily 1080 g 0   potassium chloride (KLOR-CON M) 10 MEQ tablet TAKE 1/2 TABLET EVERY DAY (CALL OFFICE TO SCHEDULE YEARLY APPT) 30 tablet 11   Respiratory Therapy Supplies (FLUTTER) DEVI 1 Device by Does not apply route as needed. 1 each 0   sodium chloride HYPERTONIC 3 % nebulizer solution Take by nebulization 2 (two) times daily as needed for cough. 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 750 mL 12   traMADol (ULTRAM) 50 MG tablet Take 1 tablet (50 mg total) by mouth every 12 (twelve) hours as needed. 180 tablet 1   traZODone (DESYREL) 100 MG tablet TAKE 1 TO 1 AND 1/2 TABLETS AT BEDTIME 135 tablet 1   divalproex (DEPAKOTE ER) 500 MG 24 hr tablet Take 1 tablet (500 mg total) by mouth at bedtime. 90 tablet 6    No facility-administered medications prior to visit.    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  Bronchiectasis ([place])    Cerebral aneurysm, nonruptured    had a clip put in   CHF (congestive heart failure) (HCC)    Clostridium difficile infection    Depressive disorder, not elsewhere classified    Diverticulosis of colon (without mention of hemorrhage)    Esophageal reflux    Fatty liver    Fibromyalgia    Gastritis    GERD (gastroesophageal reflux disease)    GI bleed 2004   Glaucoma    Hiatal hernia    History of COVID-19 05/04/2021   Hyperlipidemia    Hypertension    Hypothyroidism    Internal hemorrhoids    Joint pain    Multifocal pneumonia 06/03/2021   Obstructive sleep apnea (adult) (pediatric)    Osteoarthritis    Ostium secundum type atrial septal defect    Other chronic nonalcoholic liver disease    Other pulmonary embolism and infarction    Palpitations    Paroxysmal ventricular tachycardia (HCC)    Personal history of radiation therapy    PONV (postoperative nausea and vomiting)    Presence of permanent cardiac pacemaker    PUD (peptic ulcer disease)    Radiation 02/03/15-03/10/15   Right Breast   Sarcoid    per pt , not sure   Schatzki's ring    Shortness of breath    Sleep apnea    Stroke [facility]) 2013   tia/ pt feels it was around 2008 0r 2009   Takotsubo syndrome    Tubular adenoma of colon    Unspecified transient cerebral ischemia    Unspecified vitamin D deficiency     PAST SURGICAL HISTORY: Past Surgical History:  Procedure Laterality Date   ABI  2006   normal   BREAST EXCISIONAL BIOPSY     BREAST LUMPECTOMY Right 2016   BREAST LUMPECTOMY WITH RADIOACTIVE SEED LOCALIZATION Right 01/03/2015   Procedure: BREAST LUMPECTOMY WITH RADIOACTIVE SEED  LOCALIZATION;  Surgeon: Autumn Messing III, MD;  Location: [place];  Service: General;  Laterality: Right;   CARDIAC DEFIBRILLATOR PLACEMENT  2006; 2012   BSX single chamber ICD   carotid dopplers  2006   neg   CEREBRAL ANEURYSM REPAIR  02/1999   COLONOSCOPY     HAMMER TOE SURGERY  11/15/2020   3rd digit bilateral feet    hospitalization  2004   GI bleed, PUD, diverticulosis (EGD,colonscopy)   hospitalization     PE, NSVT, s/p defib   KNEE ARTHROSCOPY     bilateral   LEFT HEART CATHETERIZATION WITH CORONARY ANGIOGRAM N/A 02/22/2012   Procedure: LEFT HEART CATHETERIZATION WITH CORONARY ANGIOGRAM;  Surgeon: Burnell Blanks, MD;  Location[facility] C[facility];  Service: Cardiovascular;  Laterality: N/A;   PARTIAL HYSTERECTOMY     Fibroids   TONGUE BIOPSY  12/12/2017   due to sore tongue and white patches/abnormal cells   TOTAL KNEE ARTHROPLASTY Left 02/13/2016   Procedure: TOTAL KNEE ARTHROPLASTY;  Surgeon: Vickey Huger, MD;  Location: [place];  Service: Orthopedics;  Laterality: Left;   TOTAL KNEE ARTHROPLASTY Right 05/22/2021   Procedure: TOTAL KNEE ARTHROPLASTY;  Surgeon: Vickey Huger, MD;  Location: [facility];  Service: Orthopedics;  Laterality: Right;  with block   UPPER GASTROINTESTINAL ENDOSCOPY      FAMILY HISTORY: Family History  Problem Relation Age of Onset   Diabetes Mother    Alzheimer's disease Mother    Hypertension Mother    Obesity Mother    Heart disease Father    Seizures Sister  Allergies Sister    Heart disease Sister    Other Brother        Thyroid problem 10/2016   Heart disease Brother    Prostate cancer Brother 17       same brother as throat cancer   Throat cancer Brother        dx in his 25s; also a smoker   Cancer Maternal Grandmother 25       colon cancer or abdominal cancer   Lung cancer Maternal Grandfather 78   Heart disease Son        Cardiac Arrest 07/2016   Breast cancer Maternal Aunt 55   Colon cancer Maternal Aunt 61       same sister as  breast at 86   Breast cancer Maternal Aunt        dx in her 79s   Lung cancer Maternal Uncle    Pancreatic cancer Cousin 55       maternal first cousin   Breast cancer Cousin        paternal first cousin twice removed died in her 38s   Stroke Neg Hx     SOCIAL HISTORY: Social History   Socioeconomic History   Marital status: Divorced    Spouse name: Not on file   Number of children: 2   Years of education: 14   Highest education level: Not on file  Occupational History   Occupation: DISABLED  Tobacco Use   Smoking status: Former    Packs/day: 1.00    Years: 15.00    Total pack years: 15.00    Types: Cigarettes    Quit date: 08/20/1989    Years since quitting: 32.8    Passive exposure: Past   Smokeless tobacco: Never  Vaping Use   Vaping Use: Never used  Substance and Sexual Activity   Alcohol use: Yes    Alcohol/week: 3.0 standard drinks of alcohol    Types: 3 Glasses of wine per week    Comment: occasionally, not on a weekly basis   Drug use: Yes    Frequency: 1.0 times per week    Types: Marijuana    Comment: CBD gummy occ; marijuana occasionally   Sexual activity: Not Currently  Other Topics Concern   Not on file  Social History Narrative   Lives alone   Caffeine NOB:SJGG   Retired from [employer]   2 children -- 5 grandbabies - 1 great-grandbaby    Social Determinants of Health   Financial Resource Strain: Low Risk  (12/08/2020)   Overall Financial Resource Strain (CARDIA)    Difficulty of Paying Living Expenses: Not hard at all  Food Insecurity: No Food Insecurity (12/08/2020)   Hunger Vital Sign    Worried About Running Out of Food in the Last Year: Never true    Ran Out of Food in the Last Year: Never true  Transportation Needs: No Transportation Needs (12/08/2020)   PRAPARE - Hydrologist (Medical): No    Lack of Transportation (Non-Medical): No  Physical Activity: Inactive (12/08/2020)   Exercise Vital Sign    Days of  Exercise per Week: 0 days    Minutes of Exercise per Session: 0 min  Stress: No Stress Concern Present (12/08/2020)   [place]    Feeling of Stress : Not at all  Social Connections: Moderately Isolated (12/08/2020)   Social Connection and Isolation Panel [NHANES]    Frequency  of Communication with Friends and Family: More than three times a week    Frequency of Social Gatherings with Friends and Family: More than three times a week    Attends Religious Services: 1 to 4 times per year    Active Member of [employer] or Organizations: No    Attends Archivist Meetings: Never    Marital Status: Divorced  Human resources officer Violence: Not At Risk (12/08/2020)   Humiliation, Afraid, Rape, and Kick questionnaire    Fear of Current or Ex-Partner: No    Emotionally Abused: No    Physically Abused: No    Sexually Abused: No   PHYSICAL EXAM  Vitals:   06/21/22 1436  BP: (!) 149/78  Pulse: 72  Weight: 212 lb (96.2 kg)  Height: '5\' 6"'$  (1.676 m)   Body mass index is 34.22 kg/m.  Generalized: Well developed, in no acute distress  Neurological examination  Mentation: Alert oriented to time, place, history taking. Follows all commands speech and language fluent Cranial nerve II-XII: Pupils were equal round reactive to light. Extraocular movements were full, visual field were full on confrontational test. Facial sensation and strength were normal. Head turning and shoulder shrug  were normal and symmetric. Motor: The motor testing reveals 5 over 5 strength of all 4 extremities. Good symmetric motor tone is noted throughout.  Sensory: Sensory testing is intact to soft touch on all 4 extremities. No evidence of extinction is noted.  Coordination: Cerebellar testing reveals good finger-nose-finger and heel-to-shin bilaterally.  Gait and station: Gait is cautious, slightly wide-based tandem gait is unsteady. Reflexes: Deep tendon  reflexes are symmetric, did not note brisk reflexes in the knees but was painful when attempted  DIAGNOSTIC DATA (LABS, IMAGING, TESTING) - I reviewed patient records, labs, notes, testing and imaging myself where available.  Lab Results  Component Value Date   WBC 7.4 11/20/2021   HGB 13.2 11/20/2021   HCT 40.3 11/20/2021   MCV 89 11/20/2021   PLT 225 11/20/2021      Component Value Date/Time   NA 138 03/23/2022 1553   NA 141 12/01/2014 0847   K 4.2 03/23/2022 1553   K 3.8 12/01/2014 0847   CL 101 03/23/2022 1553   CO2 22 03/23/2022 1553   CO2 26 12/01/2014 0847   GLUCOSE 101 (H) 03/23/2022 1553   GLUCOSE 104 (H) 08/29/2021 1312   GLUCOSE 175 (H) 12/01/2014 0847   BUN 10 03/23/2022 1553   BUN 10.6 12/01/2014 0847   CREATININE 0.86 03/23/2022 1553   CREATININE 0.82 02/27/2017 1518   CREATININE 0.8 12/01/2014 0847   CALCIUM 9.8 03/23/2022 1553   CALCIUM 9.8 12/01/2014 0847   PROT 6.6 10/19/2021 0911   PROT 7.5 12/01/2014 0847   ALBUMIN 3.9 10/19/2021 0911   ALBUMIN 3.9 12/01/2014 0847   AST 25 10/19/2021 0911   AST 29 12/01/2014 0847   ALT 21 10/19/2021 0911   ALT 33 12/01/2014 0847   ALKPHOS 122 (H) 10/19/2021 0911   ALKPHOS 127 12/01/2014 0847   BILITOT 0.3 10/19/2021 0911   BILITOT 0.42 12/01/2014 0847   GFRNONAA >60 06/07/2021 0359   GFRNONAA 74 02/27/2017 1518   GFRAA 82 10/01/2019 1408   GFRAA 85 02/27/2017 1518   Lab Results  Component Value Date   CHOL 178 10/19/2021   HDL 51 10/19/2021   LDLCALC 109 (H) 10/19/2021   LDLDIRECT 141.8 07/04/2007   TRIG 100 10/19/2021   CHOLHDL 3.5 10/19/2021   Lab Results  Component Value Date  HGBA1C 5.7 (A) 12/19/2021   Lab Results  Component Value Date   WLSLHTDS28 768 10/01/2019   Lab Results  Component Value Date   TSH 1.55 04/10/2021    Butler Denmark, AGNP-C, DNP 06/21/2022, 4:23 PM [facility] [address], [place], [place] [address] [phone]

## 2022-06-21 ENCOUNTER — Ambulatory Visit: Payer: Medicare Other | Admitting: Family

## 2022-06-21 ENCOUNTER — Encounter: Payer: Self-pay | Admitting: Neurology

## 2022-06-21 ENCOUNTER — Ambulatory Visit (INDEPENDENT_AMBULATORY_CARE_PROVIDER_SITE_OTHER): Payer: Medicare Other | Admitting: Neurology

## 2022-06-21 VITALS — BP 149/78 | HR 72 | Ht 66.0 in | Wt 212.0 lb

## 2022-06-21 DIAGNOSIS — I671 Cerebral aneurysm, nonruptured: Secondary | ICD-10-CM

## 2022-06-21 DIAGNOSIS — M62838 Other muscle spasm: Secondary | ICD-10-CM

## 2022-06-21 DIAGNOSIS — R202 Paresthesia of skin: Secondary | ICD-10-CM | POA: Diagnosis not present

## 2022-06-21 DIAGNOSIS — R269 Unspecified abnormalities of gait and mobility: Secondary | ICD-10-CM | POA: Diagnosis not present

## 2022-06-21 MED ORDER — LEVETIRACETAM 500 MG PO TABS: 500.0000 mg | ORAL_TABLET | Freq: Two times a day (BID) | ORAL | 5 refills | Status: DC

## 2022-06-21 NOTE — Patient Instructions (Addendum)
Start Keppra 500 mg twice daily, once taking for 1 week stop the Depakote  I will order nerve conduction study with Dr. Krista Blue  Call for any worsening symptoms

## 2022-06-26 ENCOUNTER — Other Ambulatory Visit (HOSPITAL_BASED_OUTPATIENT_CLINIC_OR_DEPARTMENT_OTHER): Payer: Self-pay

## 2022-06-26 ENCOUNTER — Encounter (HOSPITAL_BASED_OUTPATIENT_CLINIC_OR_DEPARTMENT_OTHER): Payer: Self-pay | Admitting: Pulmonary Disease

## 2022-06-26 ENCOUNTER — Ambulatory Visit (INDEPENDENT_AMBULATORY_CARE_PROVIDER_SITE_OTHER): Payer: Medicare Other | Admitting: Pulmonary Disease

## 2022-06-26 VITALS — BP 126/66 | HR 88 | Temp 98.3°F | Ht 66.5 in | Wt 215.0 lb

## 2022-06-26 DIAGNOSIS — G4733 Obstructive sleep apnea (adult) (pediatric): Secondary | ICD-10-CM | POA: Diagnosis not present

## 2022-06-26 DIAGNOSIS — I2584 Coronary atherosclerosis due to calcified coronary lesion: Secondary | ICD-10-CM

## 2022-06-26 DIAGNOSIS — J471 Bronchiectasis with (acute) exacerbation: Secondary | ICD-10-CM | POA: Diagnosis not present

## 2022-06-26 DIAGNOSIS — J849 Interstitial pulmonary disease, unspecified: Secondary | ICD-10-CM | POA: Diagnosis not present

## 2022-06-26 DIAGNOSIS — I2510 Atherosclerotic heart disease of native coronary artery without angina pectoris: Secondary | ICD-10-CM

## 2022-06-26 MED ORDER — CEFUROXIME AXETIL 500 MG PO TABS: 500.0000 mg | ORAL_TABLET | Freq: Two times a day (BID) | ORAL | 0 refills | Status: DC

## 2022-06-26 MED ORDER — AREXVY 120 MCG/0.5ML IM SUSR
INTRAMUSCULAR | 0 refills | Status: DC
  Filled 2022-06-26: qty 0.5, 1d supply, fill #0

## 2022-06-26 NOTE — Progress Notes (Signed)
[facility]  Chief Complaint  Patient presents with   Follow-up    Pt states she has been put on new medications for seizures.      Constitutional:  BP 126/66 (BP Location: Right Arm, Patient Position: Sitting, Cuff Size: Large)   Pulse 88   Temp 98.3 F (36.8 C) (Oral)   Ht 5' 6.5" (1.689 m)   Wt 215 lb (97.5 kg)   LMP  (LMP Unknown)   SpO2 99%   BMI 34.18 kg/m   Past Medical History:  Vit D deficiency, Takotsubo CM, Schatzki's ring, PUD, VT s/p AICD, PE, NASH, ASD, Hypothyroidism, HTN, HLD, HH, GERD, Fibromyalgia, Diverticulosis, Depression, C diff, Cerebral aneurysm, Breast cancer 2016, OA, COVID 19 pneumonia September 2022  Past Surgical History:  Her  has a past surgical history that includes Partial hysterectomy; Cerebral aneurysm repair (02/1999); Knee arthroscopy; Cardiac defibrillator placement (2006; 2012); carotid dopplers (2006); hospitalization (2004); hospitalization; ABI (2006); left heart catheterization with coronary angiogram (N/A, 02/22/2012); Breast lumpectomy with radioactive seed localization (Right, 01/03/2015); Total knee arthroplasty (Left, 02/13/2016); Breast excisional biopsy; Tongue Biopsy (12/12/2017); Colonoscopy; Upper gastrointestinal endoscopy; Hammer toe surgery (11/15/2020); Total knee arthroplasty (Right, 05/22/2021); and Breast lumpectomy (Right, 2016).  Brief Summary:  [name] is a 71 y.o. female former smoker with bronchiectasis and obstructive sleep apnea.  She had COVID pneumonia in October 2022.      Subjective:   PFT from 06/08/22 showed mild restriction and diffusion defect.  CT chest is scheduled for 07/06/22.    She was working outside last month.  She started getting more cough and chest congestion since then.  Bringing up yellow sputum.  Gets wheezing when she has more phlegm.  Uses CPAP nightly.  She can use for about 5.5 hours.  She then has to take it off because she feels  short of breath.  Physical Exam:   Appearance - well kempt   ENMT - wearing filter mask  Respiratory - equal breath sounds bilaterally, no wheezing or rales  CV - s1s2 regular rate and rhythm, no murmurs  Ext - no clubbing, no edema  Skin - no rashes  Psych - normal mood and affect        Pulmonary testing:  PFT 04/17/12 >> FEV1 2.60 (110%), FEV1% 85, TLC 4.05 (77%), DLCO 44% PFT 01/17/17 >> FEV1 2.38 (111%), FEV1% 90, TLC 3.85 (71%), DLCO 58%, no BD RAST 05/30/17 >> dust mites PFT 10/16/18 >> FEV1 2.27 (111%), FEV1% 90, TLC 4.00 (74%), DLCO 59% PFT 06/08/22 >> FEV1 2.20 (91%), FEV1% 93, TLC 3.76 (69%), DLCO 62%  Serology:  04/02/12 >> ACE 1 10/25/15 >> ESR 40, ACE 32 05/30/17 >> IgA, IgG, IgM normal 10/16/18 >> HP panel negative, ACE 25  Chest Imaging:  CT chest 04/10/12 >> b/l lower lobe cylindrical BTX and some in upper lobes CT angio chest 10/26/15 >> patchy GGO in periphery of lungs b/l, basilar BTX, no PE; no significant change compared to 2013 HRCT chest 10/08/18 >> atherosclerosis, basilar predominant cylindrical BTX, mild centrilobular/paraseptal emphysema, air trapping CT angio chest 03/24/19 >> Chronic lung changes with peribronchial thickening, bibasilar atelectasis and chronic interstitial disease/peripheral fibrosis at the lung bases CT angio chest 06/03/21 >> scattered GGO with fibrotic changes CT angio chest 08/30/21 >> BTX with emphysema and fibrotic changes in periphery  Sleep Tests:  PSG 11/29/08 >> AHI 9 HST 11/10/15 >> AHI 7.1, SaO2 low 75% CPAP 06/17/21 to 07/16/21 >> used on 27  of 30 nights with average 5 hrs 24 min.  Average AHI 2.5 with CPAP 5 cm H2O.  Cardiac Tests:  Echo 08/11/21 >> EF 50 to 55%, RVSP 39.5 mmHg  Social History:  She  reports that she quit smoking about 32 years ago. Her smoking use included cigarettes. She has a 15.00 pack-year smoking history. She has been exposed to tobacco smoke. She has never used smokeless tobacco. She reports  current alcohol use of about 3.0 standard drinks of alcohol per week. She reports current drug use. Frequency: 1.00 time per week. Drug: Marijuana.  Family History:  Her family history includes Allergies in her sister; Alzheimer's disease in her mother; Breast cancer in her cousin and maternal aunt; Breast cancer (age of onset: 49) in her maternal aunt; Cancer (age of onset: 37) in her maternal grandmother; Colon cancer (age of onset: 31) in her maternal aunt; Diabetes in her mother; Heart disease in her brother, father, sister, and son; Hypertension in her mother; Lung cancer in her maternal uncle; Lung cancer (age of onset: 65) in her maternal grandfather; Obesity in her mother; Other in her brother; Pancreatic cancer (age of onset: 66) in her cousin; Prostate cancer (age of onset: 55) in her brother; Seizures in her sister; Throat cancer in her brother.     Assessment/Plan:   History of COVID 19 pneumonia from October 2022. - she has changes of ILD on CT from prior COVID pneumonia, but has improvement on CT chest from January 2023 - PFT from 05/2022 stable - will call her with results of CT chest from 07/06/22  Chronic respiratory failure with hypoxia. - 2 liters oxygen prn with exertion - reassess home oxygen need at next follow up  Bronchiectasis. - mild exacerbation after environmental exposure - will give her course of ceftin 500 mg bid for 7 days - continue symbicort one puff bid - prn albuterol, mucinex, flutter valve, hypertonic saline  Upper airway cough syndrome with allergic rhinitis. - continue singulair, allegra, flonase, Navage nasal irrigation   Obstructive sleep apnea. - she is compliant with CPAP and reports benefit - she uses [facility] for her DME - current CPAP ordered September 2022 - will change to auto CPAP 5 to 8 cm H2O  Seizures with history of cerebral aneurysm. - discussed how sleep disruption can impact seizure control - followed by Dr[facility] Krista [facility] with [facility]  Neurology  Non-ischemic cardiomyopathy. - followed by Dr. Irish Lack and Dr. Caryl Comes with [facility]   Time Spent Involved in Patient Care on Day of Examination:  37 minutes  Follow up:   Patient Instructions  Ceftin 500 mg twice per day for 7 days  Will have [facility] change your CPAP to 5 - 8 cm water pressure  Will call you with results of your CT chest  Follow up in 4 months  Medication List:   Allergies as of 06/26/2022       Reactions   Ace Inhibitors Swelling   Angioedema; makes tongue "break out"   Amitriptyline Hcl Other (See Comments)    makes her too sleepy!   Atorvastatin Other (See Comments)   SEVERE MYALGIA   Cymbalta [duloxetine Hcl] Nausea Only, Other (See Comments)   Sleepiness/ sick   Dilantin [phenytoin Sodium Extended] Rash   Severe rash   Paroxetine Nausea Only   Rapid heartbeat   Ramipril Other (See Comments)   TONGUE ULCERS   Rosuvastatin Other (See Comments)   SEVERE MYALGIA   Carbamazepine Rash   Codeine Itching  Paxlovid [nirmatrelvir-ritonavir] Itching   Phenytoin Sodium Extended Rash   Simvastatin Other (See Comments)   Increase in CK, myalgias   Wellbutrin [bupropion] Palpitations        Medication List        Accurate as of June 26, 2022 11:43 AM. If you have any questions, ask your nurse or doctor.          acetaminophen 500 MG tablet Commonly known as: TYLENOL Take 500 mg by mouth every 6 (six) hours as needed for moderate pain.   albuterol (2.5 MG/3ML) 0.083% nebulizer solution Commonly known as: PROVENTIL USE 1 VIAL IN NEBULIZER EVERY 6 HOURS What changed: See the new instructions.   albuterol 108 (90 Base) MCG/ACT inhaler Commonly known as: Ventolin HFA Inhale 2 puffs into the lungs every 6 (six) hours as needed for wheezing or shortness of breath. What changed: Another medication with the same name was changed. Make sure you understand how and when to take each.   aspirin EC 81 MG tablet Take 81 mg by  mouth daily. Swallow whole.   azelastine 0.1 % nasal spray Commonly known as: ASTELIN Place 1 spray into both nostrils 2 (two) times daily. Use in each nostril as directed   baclofen 10 MG tablet Commonly known as: LIORESAL Take 1 tablet (10 mg total) by mouth at bedtime as needed for muscle spasms.   budesonide-formoterol 80-4.5 MCG/ACT inhaler Commonly known as: Symbicort Inhale 1 puff into the lungs 2 (two) times daily.   cefUROXime 500 MG tablet Commonly known as: CEFTIN Take 1 tablet (500 mg total) by mouth 2 (two) times daily with a meal. Started by: Chesley Mires, MD   cetirizine 10 MG tablet Commonly known as: ZYRTEC Take 10 mg by mouth daily as needed for allergies.   Dialyvite Vitamin D 5000 125 MCG (5000 UT) capsule Generic drug: Cholecalciferol Take 5,000 Units by mouth daily.   diclofenac Sodium 1 % Gel Commonly known as: VOLTAREN Apply 2-4 grams to affected joint 4 times daily as needed. What changed:  how much to take how to take this when to take this reasons to take this additional instructions   diltiazem 120 MG 24 hr capsule Commonly known as: Cardizem CD Take 1 capsule (120 mg total) by mouth daily. Do Not start this medication until after you wean off Carvedilol.   ezetimibe 10 MG tablet Commonly known as: ZETIA Take 1 tablet (10 mg total) by mouth daily.   famotidine 40 MG tablet Commonly known as: Pepcid Take 1 tablet (40 mg total) by mouth at bedtime.   fexofenadine 180 MG tablet Commonly known as: ALLEGRA Take 180 mg by mouth daily as needed for allergies (alternates with zyrtec sometimes).   fluticasone 50 MCG/ACT nasal spray Commonly known as: FLONASE Place 2 sprays into both nostrils daily as needed for allergies or rhinitis.   Flutter Devi 1 Device by Does not apply route as needed.   FOLIC ACID PO Take 1 tablet by mouth daily.   furosemide 20 MG tablet Commonly known as: LASIX TAKE 2 TABLETS (40 MG) 3 DAYS PER WEEK AND TAKE  1 TABLET (20 MG) THE OTHER DAYS OF THE WEEK   gabapentin 300 MG capsule Commonly known as: NEURONTIN Take 1 capsule (300 mg total) by mouth at bedtime.   gabapentin 100 MG capsule Commonly known as: Neurontin Take 1 capsule (100 mg total) by mouth 3 (three) times daily.   hydroxypropyl methylcellulose / hypromellose 2.5 % ophthalmic solution Commonly known as:  ISOPTO TEARS / GONIOVISC Place 1 drop into both eyes at bedtime.   JARDIANCE PO Take by mouth.   latanoprost 0.005 % ophthalmic solution Commonly known as: XALATAN Place 1 drop into both eyes at bedtime.   levETIRAcetam 500 MG tablet Commonly known as: Keppra Take 1 tablet (500 mg total) by mouth 2 (two) times daily.   levothyroxine 50 MCG tablet Commonly known as: SYNTHROID TAKE 1 TABLET EVERY DAY   magnesium oxide 400 MG tablet Commonly known as: MAG-OX Take 1 tablet (400 mg total) by mouth 2 (two) times daily.   mometasone 0.1 % lotion Commonly known as: ELOCON Apply 1 application topically daily as needed (rash).   montelukast 10 MG tablet Commonly known as: SINGULAIR TAKE 1 TABLET AT BEDTIME   MULTIVITAMIN PO Take 1 tablet by mouth daily.   nitroGLYCERIN 0.4 MG SL tablet Commonly known as: NITROSTAT DISSOLVE ONE TABLET UNDER THE TONGUE EVERY 5 MINUTES AS NEEDED FOR CHEST PAIN.  DO NOT EXCEED A TOTAL OF 3 DOSES IN 15 MINUTES What changed: See the new instructions.   nystatin 100000 UNIT/ML suspension Commonly known as: MYCOSTATIN Take 5 mLs (500,000 Units total) by mouth 4 (four) times daily.   pantoprazole 40 MG tablet Commonly known as: PROTONIX Take 1 tablet (40 mg total) by mouth daily.   polyethylene glycol powder 17 GM/SCOOP powder Commonly known as: GLYCOLAX/MIRALAX Take 17 grams by mouth daily   potassium chloride 10 MEQ tablet Commonly known as: KLOR-CON M TAKE 1/2 TABLET EVERY DAY (CALL OFFICE TO SCHEDULE YEARLY APPT)   sodium chloride HYPERTONIC 3 % nebulizer solution Take by  nebulization 2 (two) times daily as needed for cough. 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   traMADol 50 MG tablet Commonly known as: ULTRAM Take 1 tablet (50 mg total) by mouth every 12 (twelve) hours as needed.   traZODone 100 MG tablet Commonly known as: DESYREL TAKE 1 TO 1 AND 1/2 TABLETS AT BEDTIME   vitamin C 1000 MG tablet Take 1,000 mg by mouth daily.        Signature:  Chesley Mires, MD [place] Pager - [phone] 06/26/2022, 11:43 AM

## 2022-06-26 NOTE — Patient Instructions (Signed)
Ceftin 500 mg twice per day for 7 days  Will have [facility] change your CPAP to 5 - 8 cm water pressure  Will call you with results of your CT chest  Follow up in 4 months

## 2022-06-27 ENCOUNTER — Ambulatory Visit (INDEPENDENT_AMBULATORY_CARE_PROVIDER_SITE_OTHER): Payer: Medicare Other | Admitting: Family

## 2022-06-27 ENCOUNTER — Encounter: Payer: Self-pay | Admitting: Family

## 2022-06-27 VITALS — BP 124/78 | HR 74 | Temp 98.3°F | Resp 16 | Ht 66.5 in | Wt 210.0 lb

## 2022-06-27 DIAGNOSIS — R7303 Prediabetes: Secondary | ICD-10-CM

## 2022-06-27 NOTE — Progress Notes (Signed)
Pt presents for prediabetes follow-up

## 2022-06-27 NOTE — Patient Instructions (Signed)
Prediabetes Eating Plan ?Prediabetes is a condition that causes blood sugar (glucose) levels to be higher than normal. This increases the risk for developing type 2 diabetes (type 2 diabetes mellitus). Working with a health care provider or nutrition specialist (dietitian) to make diet and lifestyle changes can help prevent the onset of diabetes. These changes may help you: ?Control your blood glucose levels. ?Improve your cholesterol levels. ?Manage your blood pressure. ?What are tips for following this plan? ?Reading food labels ?Read food labels to check the amount of fat, salt (sodium), and sugar in prepackaged foods. Avoid foods that have: ?Saturated fats. ?Trans fats. ?Added sugars. ?Avoid foods that have more than 300 milligrams (mg) of sodium per serving. Limit your sodium intake to less than 2,300 mg each day. ?Shopping ?Avoid buying pre-made and processed foods. ?Avoid buying drinks with added sugar. ?Cooking ?Cook with olive oil. Do not use butter, lard, or ghee. ?Bake, broil, grill, steam, or boil foods. Avoid frying. ?Meal planning ? ?Work with your dietitian to create an eating plan that is right for you. This may include tracking how many calories you take in each day. Use a food diary, notebook, or mobile application to track what you eat at each meal. ?Consider following a Mediterranean diet. This includes: ?Eating several servings of fresh fruits and vegetables each day. ?Eating fish at least twice a week. ?Eating one serving each day of whole grains, beans, nuts, and seeds. ?Using olive oil instead of other fats. ?Limiting alcohol. ?Limiting red meat. ?Using nonfat or low-fat dairy products. ?Consider following a plant-based diet. This includes dietary choices that focus on eating mostly vegetables and fruit, grains, beans, nuts, and seeds. ?If you have high blood pressure, you may need to limit your sodium intake or follow a diet such as the DASH (Dietary Approaches to Stop Hypertension) eating  plan. The DASH diet aims to lower high blood pressure. ?Lifestyle ?Set weight loss goals with help from your health care team. It is recommended that most people with prediabetes lose 7% of their body weight. ?Exercise for at least 30 minutes 5 or more days a week. ?Attend a support group or seek support from a mental health counselor. ?Take over-the-counter and prescription medicines only as told by your health care provider. ?What foods are recommended? ?Fruits ?Berries. Bananas. Apples. Oranges. Grapes. Papaya. Mango. Pomegranate. Kiwi. Grapefruit. Cherries. ?Vegetables ?Lettuce. Spinach. Peas. Beets. Cauliflower. Cabbage. Broccoli. Carrots. Tomatoes. Squash. Eggplant. Herbs. Peppers. Onions. Cucumbers. Brussels sprouts. ?Grains ?Whole grains, such as whole-wheat or whole-grain breads, crackers, cereals, and pasta. Unsweetened oatmeal. Bulgur. Barley. Quinoa. Brown rice. Corn or whole-wheat flour tortillas or taco shells. ?Meats and other proteins ?Seafood. Poultry without skin. Lean cuts of pork and beef. Tofu. Eggs. Nuts. Beans. ?Dairy ?Low-fat or fat-free dairy products, such as yogurt, cottage cheese, and cheese. ?Beverages ?Water. Tea. Coffee. Sugar-free or diet soda. Seltzer water. Low-fat or nonfat milk. Milk alternatives, such as soy or almond milk. ?Fats and oils ?Olive oil. Canola oil. Sunflower oil. Grapeseed oil. Avocado. Walnuts. ?Sweets and desserts ?Sugar-free or low-fat pudding. Sugar-free or low-fat ice cream and other frozen treats. ?Seasonings and condiments ?Herbs. Sodium-free spices. Mustard. Relish. Low-salt, low-sugar ketchup. Low-salt, low-sugar barbecue sauce. Low-fat or fat-free mayonnaise. ?The items listed above may not be a complete list of recommended foods and beverages. Contact a dietitian for more information. ?What foods are not recommended? ?Fruits ?Fruits canned with syrup. ?Vegetables ?Canned vegetables. Frozen vegetables with butter or cream sauce. ?Grains ?Refined white  flour and flour   products, such as bread, pasta, snack foods, and cereals. ?Meats and other proteins ?Fatty cuts of meat. Poultry with skin. Breaded or fried meat. Processed meats. ?Dairy ?Full-fat yogurt, cheese, or milk. ?Beverages ?Sweetened drinks, such as iced tea and soda. ?Fats and oils ?Butter. Lard. Ghee. ?Sweets and desserts ?Baked goods, such as cake, cupcakes, pastries, cookies, and cheesecake. ?Seasonings and condiments ?Spice mixes with added salt. Ketchup. Barbecue sauce. Mayonnaise. ?The items listed above may not be a complete list of foods and beverages that are not recommended. Contact a dietitian for more information. ?Where to find more information ?American Diabetes Association: www.diabetes.org ?Summary ?You may need to make diet and lifestyle changes to help prevent the onset of diabetes. These changes can help you control blood sugar, improve cholesterol levels, and manage blood pressure. ?Set weight loss goals with help from your health care team. It is recommended that most people with prediabetes lose 7% of their body weight. ?Consider following a Mediterranean diet. This includes eating plenty of fresh fruits and vegetables, whole grains, beans, nuts, seeds, fish, and low-fat dairy, and using olive oil instead of other fats. ?This information is not intended to replace advice given to you by your health care provider. Make sure you discuss any questions you have with your health care provider. ?Document Revised: 11/05/2019 Document Reviewed: 11/05/2019 ?Elsevier Patient Education ? 2023 Elsevier Inc. ? ?

## 2022-06-28 LAB — HEMOGLOBIN A1C
Est. average glucose Bld gHb Est-mCnc: 105 mg/dL
Hgb A1c MFr Bld: 5.3 % (ref 4.8–5.6)

## 2022-06-29 NOTE — Telephone Encounter (Signed)
Patient was seen by Dr. Halford Chessman on 11/7 at [facility]. Order was sent to DME after that visit. Will close this encounter.

## 2022-07-06 ENCOUNTER — Ambulatory Visit (HOSPITAL_BASED_OUTPATIENT_CLINIC_OR_DEPARTMENT_OTHER)
Admission: RE | Admit: 2022-07-06 | Discharge: 2022-07-06 | Disposition: A | Discharge status: Home / Self Care | Payer: Medicare Other | Source: Ambulatory Visit | Attending: Pulmonary Disease | Admitting: Pulmonary Disease

## 2022-07-06 DIAGNOSIS — J849 Interstitial pulmonary disease, unspecified: Secondary | ICD-10-CM | POA: Diagnosis present

## 2022-07-10 ENCOUNTER — Encounter: Payer: Self-pay | Admitting: Internal Medicine

## 2022-07-11 ENCOUNTER — Other Ambulatory Visit (HOSPITAL_BASED_OUTPATIENT_CLINIC_OR_DEPARTMENT_OTHER): Payer: Self-pay | Admitting: Pulmonary Disease

## 2022-07-16 ENCOUNTER — Telehealth: Payer: Self-pay | Admitting: Neurology

## 2022-07-16 ENCOUNTER — Telehealth: Payer: Self-pay | Admitting: Pulmonary Disease

## 2022-07-16 DIAGNOSIS — J849 Interstitial pulmonary disease, unspecified: Secondary | ICD-10-CM

## 2022-07-16 MED ORDER — LEVETIRACETAM 500 MG PO TABS: 500.0000 mg | ORAL_TABLET | Freq: Two times a day (BID) | ORAL | 1 refills | Status: DC

## 2022-07-16 NOTE — Telephone Encounter (Signed)
Called and spoke with patient. She asked to have labs done at [address] office in [place]. Orders placed for labs. Nothing further needed

## 2022-07-16 NOTE — Telephone Encounter (Signed)
[name] is calling from [facility]. Stated pt needs refill on medication levETIRAcetam (KEPPRA) 500 MG tablet. Refill should be sent to [facility]

## 2022-07-16 NOTE — Telephone Encounter (Signed)
HRCT chest 07/08/22 >> widespread but patchy areas of GGO, septal thickening, subpleural reticulation, parenchymal banding, traction BTX, peripheral bronchiolectasis and extensive honeycombing. These findings have a definitive craniocaudal gradient and are progressive compared to the prior examination from 08/30/2021 (diagnostic of UIP).      Results discussed with patient.  Please arrange for her to have the following lab tests done: CMET, CBC with diff, IgE, ANA with reflex, ANCA, Rheumatoid Factor, SCL - 70, ESR and CRP.

## 2022-07-16 NOTE — Telephone Encounter (Signed)
Rx sent to requested pharmacy

## 2022-07-17 ENCOUNTER — Other Ambulatory Visit: Payer: Self-pay | Admitting: *Deleted

## 2022-07-17 ENCOUNTER — Other Ambulatory Visit (INDEPENDENT_AMBULATORY_CARE_PROVIDER_SITE_OTHER): Payer: Medicare Other

## 2022-07-17 DIAGNOSIS — J849 Interstitial pulmonary disease, unspecified: Secondary | ICD-10-CM | POA: Diagnosis not present

## 2022-07-17 LAB — CBC WITH DIFFERENTIAL/PLATELET
Basophils Absolute: 0 10*3/uL (ref 0.0–0.1)
Basophils Relative: 1.2 % (ref 0.0–3.0)
Eosinophils Absolute: 0.2 10*3/uL (ref 0.0–0.7)
Eosinophils Relative: 4.4 % (ref 0.0–5.0)
HCT: 45 % (ref 36.0–46.0)
Hemoglobin: 14.9 g/dL (ref 12.0–15.0)
Lymphocytes Relative: 20.4 % (ref 12.0–46.0)
Lymphs Abs: 0.8 10*3/uL (ref 0.7–4.0)
MCHC: 33 g/dL (ref 30.0–36.0)
MCV: 93.2 fl (ref 78.0–100.0)
Monocytes Absolute: 0.4 10*3/uL (ref 0.1–1.0)
Monocytes Relative: 9.6 % (ref 3.0–12.0)
Neutro Abs: 2.5 10*3/uL (ref 1.4–7.7)
Neutrophils Relative %: 64.4 % (ref 43.0–77.0)
Platelets: 166 10*3/uL (ref 150.0–400.0)
RBC: 4.83 Mil/uL (ref 3.87–5.11)
RDW: 13.5 % (ref 11.5–15.5)
WBC: 3.9 10*3/uL — ABNORMAL LOW (ref 4.0–10.5)

## 2022-07-17 LAB — C-REACTIVE PROTEIN: CRP: <1 mg/dL (ref 0.5–20.0)

## 2022-07-17 LAB — COMPREHENSIVE METABOLIC PANEL
ALT: 47 U/L — ABNORMAL HIGH (ref 0–35)
AST: 40 U/L — ABNORMAL HIGH (ref 0–37)
Albumin: 4.3 g/dL (ref 3.5–5.2)
Alkaline Phosphatase: 84 U/L (ref 39–117)
BUN: 13 mg/dL (ref 6–23)
CO2: 29 mEq/L (ref 19–32)
Calcium: 9.9 mg/dL (ref 8.4–10.5)
Chloride: 105 mEq/L (ref 96–112)
Creatinine, Ser: 0.81 mg/dL (ref 0.40–1.20)
GFR: 71.82 mL/min (ref >60.00)
Glucose, Bld: 85 mg/dL (ref 70–99)
Potassium: 3.9 mEq/L (ref 3.5–5.1)
Sodium: 140 mEq/L (ref 135–145)
Total Bilirubin: 0.5 mg/dL (ref 0.2–1.2)
Total Protein: 7.4 g/dL (ref 6.0–8.3)

## 2022-07-17 LAB — SEDIMENTATION RATE: Sed Rate: 22 mm/hr (ref 0–30)

## 2022-07-18 LAB — ANCA SCREEN W REFLEX TITER: ANCA SCREEN: NEGATIVE

## 2022-07-18 LAB — ANA: Anti Nuclear Antibody (ANA): NEGATIVE

## 2022-07-20 ENCOUNTER — Telehealth: Payer: Self-pay | Admitting: Pulmonary Disease

## 2022-07-20 LAB — RHEUMATOID FACTOR: Rheumatoid fact SerPl-aCnc: <10 IU/mL (ref <14.0)

## 2022-07-20 LAB — ANTI-SCLERODERMA ANTIBODY: Scleroderma (Scl-70) (ENA) Antibody, IgG: <0.2 AI (ref 0.0–0.9)

## 2022-07-20 LAB — IGE: IgE (Immunoglobulin E), Serum: 37 IU/mL (ref 6–495)

## 2022-07-20 NOTE — Telephone Encounter (Signed)
Discussed lab results with patient.  Please arrange for referral to Dr. Brand Males as second opinion to help with management of ILD with UIP pattern.

## 2022-07-20 NOTE — Telephone Encounter (Signed)
Called and spoke with patient and scheduled her for first available appt with Dr. Birdena Jubilee on 08/23/22.   Dr. Halford Chessman routing to you as an [place]

## 2022-07-23 ENCOUNTER — Telehealth: Payer: Self-pay | Admitting: Neurology

## 2022-07-23 ENCOUNTER — Telehealth: Payer: Self-pay | Admitting: Family Medicine

## 2022-07-23 NOTE — Telephone Encounter (Signed)
Left patient a voicemail and will reach out on Mychart. Advised see Pcp and call back for appt with Sarah.

## 2022-07-23 NOTE — Telephone Encounter (Signed)
Sent Mychart Message

## 2022-07-23 NOTE — Telephone Encounter (Signed)
Appointment has been cancelled sent a telephone note to Pd 2 for Sarah for an issue that patient wants to address.

## 2022-07-23 NOTE — Telephone Encounter (Signed)
[name], Patient really wants to see you tomorrow regarding a headache and situation that occurred from last week. She wanted me to check to see if it is ok to be seen. Sorry

## 2022-07-23 NOTE — Telephone Encounter (Signed)
Patient twisted her back last week and she said she is having headaches. Around corner of eye by nose and shoots down and right side of neck and back. Hard fr her to walk correctly and feels like she is almost falling. She did not call her primary but if that is suggested then she will do so. She had an appt with Amy tomorrow but we are cancelling because she was seen by Judson Roch 11/23 and follows Sarah and Dr. Krista Blue per Amy. Thanks

## 2022-07-23 NOTE — Telephone Encounter (Signed)
Can you please call [name] and let her know that we do not need to see her tomorrow. She was scheduled with me followinng Dr Greer Pickerel visit 04/2022. She is followed by Judson Roch regularly and seen 06/2022. She has follow up with Dr Krista Blue 09/2021. Please cancel tomorrow's visit with me. TY!

## 2022-07-24 ENCOUNTER — Encounter: Payer: Self-pay | Admitting: Family Medicine

## 2022-07-24 ENCOUNTER — Ambulatory Visit: Payer: Medicare Other | Admitting: Family Medicine

## 2022-07-24 ENCOUNTER — Ambulatory Visit (INDEPENDENT_AMBULATORY_CARE_PROVIDER_SITE_OTHER): Payer: Medicare Other | Admitting: Family Medicine

## 2022-07-24 VITALS — BP 130/62 | HR 71 | Temp 97.8°F | Resp 16 | Wt 207.0 lb

## 2022-07-24 DIAGNOSIS — M25552 Pain in left hip: Secondary | ICD-10-CM | POA: Diagnosis not present

## 2022-07-24 DIAGNOSIS — M545 Low back pain, unspecified: Secondary | ICD-10-CM

## 2022-07-24 MED ORDER — TRIAMCINOLONE ACETONIDE 40 MG/ML IJ SUSP
40.0000 mg | Freq: Once | INTRAMUSCULAR | Status: AC
  Administered 2022-07-24: 40 mg via INTRAMUSCULAR

## 2022-07-24 MED ORDER — KETOROLAC TROMETHAMINE 60 MG/2ML IM SOLN
60.0000 mg | Freq: Once | INTRAMUSCULAR | Status: AC
  Administered 2022-07-24: 60 mg via INTRAMUSCULAR

## 2022-07-24 NOTE — Progress Notes (Signed)
Established Patient Office Visit  Subjective    Patient ID: [name], female    DOB: 22-Jan-1951  Age: 71 y.o. MRN: [id number]  CC: No chief complaint on file.   HPI [name] presents with complaint of low back pain and left hip pain after having a "fall" while dealing with a vehicle door when the vehicle abruptly braked hard. She is taking muscle relaxer and tramadol.    Outpatient Encounter Medications as of 07/24/2022  Medication Sig   acetaminophen (TYLENOL) 500 MG tablet Take 500 mg by mouth every 6 (six) hours as needed for moderate pain.   albuterol (PROVENTIL) (2.5 MG/3ML) 0.083% nebulizer solution USE 1 VIAL IN NEBULIZER EVERY 6 HOURS (Patient taking differently: Take 2.5 mg by nebulization every 6 (six) hours as needed for shortness of breath.)   albuterol (VENTOLIN HFA) 108 (90 Base) MCG/ACT inhaler Inhale 2 puffs into the lungs every 6 (six) hours as needed for wheezing or shortness of breath.   Ascorbic Acid (VITAMIN C) 1000 MG tablet Take 1,000 mg by mouth daily.   aspirin EC 81 MG tablet Take 81 mg by mouth daily. Swallow whole.   azelastine (ASTELIN) 0.1 % nasal spray Place 1 spray into both nostrils 2 (two) times daily. Use in each nostril as directed   baclofen (LIORESAL) 10 MG tablet Take 1 tablet (10 mg total) by mouth at bedtime as needed for muscle spasms.   budesonide-formoterol (SYMBICORT) 80-4.5 MCG/ACT inhaler Inhale 1 puff into the lungs 2 (two) times daily.   cefUROXime (CEFTIN) 500 MG tablet Take 1 tablet (500 mg total) by mouth 2 (two) times daily with a meal.   cetirizine (ZYRTEC) 10 MG tablet Take 10 mg by mouth daily as needed for allergies.   Cholecalciferol (DIALYVITE VITAMIN D 5000) 125 MCG (5000 UT) capsule Take 5,000 Units by mouth daily.   diclofenac Sodium (VOLTAREN) 1 % GEL Apply 2-4 grams to affected joint 4 times daily as needed. (Patient taking differently: Apply 2-4 g topically 4 (four) times daily as needed (joint pain).)    diltiazem (CARDIZEM CD) 120 MG 24 hr capsule Take 1 capsule (120 mg total) by mouth daily. Do Not start this medication until after you wean off Carvedilol.   Empagliflozin (JARDIANCE PO) Take by mouth.   ezetimibe (ZETIA) 10 MG tablet Take 1 tablet (10 mg total) by mouth daily.   famotidine (PEPCID) 40 MG tablet Take 1 tablet (40 mg total) by mouth at bedtime.   fexofenadine (ALLEGRA) 180 MG tablet Take 180 mg by mouth daily as needed for allergies (alternates with zyrtec sometimes).   fluticasone (FLONASE) 50 MCG/ACT nasal spray Place 2 sprays into both nostrils daily as needed for allergies or rhinitis.   FOLIC ACID PO Take 1 tablet by mouth daily.   furosemide (LASIX) 20 MG tablet TAKE 2 TABLETS (40 MG) 3 DAYS PER WEEK AND TAKE 1 TABLET (20 MG) THE OTHER DAYS OF THE WEEK   gabapentin (NEURONTIN) 100 MG capsule Take 1 capsule (100 mg total) by mouth 3 (three) times daily.   gabapentin (NEURONTIN) 300 MG capsule Take 1 capsule (300 mg total) by mouth at bedtime.   hydroxypropyl methylcellulose / hypromellose (ISOPTO TEARS / GONIOVISC) 2.5 % ophthalmic solution Place 1 drop into both eyes at bedtime.   JARDIANCE 10 MG TABS tablet Take 10 mg by mouth daily.   latanoprost (XALATAN) 0.005 % ophthalmic solution Place 1 drop into both eyes at bedtime.   levETIRAcetam (KEPPRA) 500 MG  tablet Take 1 tablet (500 mg total) by mouth 2 (two) times daily.   levothyroxine (SYNTHROID) 50 MCG tablet TAKE 1 TABLET EVERY DAY   magnesium oxide (MAG-OX) 400 MG tablet Take 1 tablet (400 mg total) by mouth 2 (two) times daily.   mometasone (ELOCON) 0.1 % lotion Apply 1 application topically daily as needed (rash).   montelukast (SINGULAIR) 10 MG tablet TAKE 1 TABLET AT BEDTIME   Multiple Vitamins-Minerals (MULTIVITAMIN PO) Take 1 tablet by mouth daily.   nitroGLYCERIN (NITROSTAT) 0.4 MG SL tablet DISSOLVE ONE TABLET UNDER THE TONGUE EVERY 5 MINUTES AS NEEDED FOR CHEST PAIN.  DO NOT EXCEED A TOTAL OF 3 DOSES IN 15  MINUTES (Patient taking differently: Place 0.4 mg under the tongue every 5 (five) minutes as needed for chest pain.)   nystatin (MYCOSTATIN) 100000 UNIT/ML suspension Take 5 mLs (500,000 Units total) by mouth 4 (four) times daily.   pantoprazole (PROTONIX) 40 MG tablet Take 1 tablet (40 mg total) by mouth daily.   polyethylene glycol powder (GLYCOLAX/MIRALAX) powder Take 17 grams by mouth daily   potassium chloride (KLOR-CON M) 10 MEQ tablet TAKE 1/2 TABLET EVERY DAY (CALL OFFICE TO SCHEDULE YEARLY APPT)   Respiratory Therapy Supplies (FLUTTER) DEVI 1 Device by Does not apply route as needed.   RSV vaccine recomb adjuvanted (AREXVY) 120 MCG/0.5ML injection Inject into the muscle.   sodium chloride HYPERTONIC 3 % nebulizer solution Take by nebulization 2 (two) times daily as needed for cough. 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   traMADol (ULTRAM) 50 MG tablet Take 1 tablet (50 mg total) by mouth every 12 (twelve) hours as needed.   traZODone (DESYREL) 100 MG tablet TAKE 1 TO 1 AND 1/2 TABLETS AT BEDTIME   [EXPIRED] ketorolac (TORADOL) injection 60 mg    [EXPIRED] triamcinolone acetonide (KENALOG-40) injection 40 mg    No facility-administered encounter medications on file as of 07/24/2022.    Past Medical History:  Diagnosis Date   AICD (automatic cardioverter/defibrillator) present    Anxiety    Arthritis    Back pain    Breast cancer ([place]) 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   Bronchiectasis ([place])    Cerebral aneurysm, nonruptured    had a clip put in   CHF (congestive heart failure) ([place])    Clostridium difficile infection    Depressive disorder, not elsewhere classified    Diverticulosis of colon (without mention of hemorrhage)    Esophageal reflux    Fatty liver    Fibromyalgia    Gastritis    GERD (gastroesophageal reflux disease)    GI bleed 2004   Glaucoma    Hiatal hernia    History of COVID-19 05/04/2021   Hyperlipidemia    Hypertension    Hypothyroidism    Internal  hemorrhoids    Joint pain    Multifocal pneumonia 06/03/2021   Obstructive sleep apnea (adult) (pediatric)    Osteoarthritis    Ostium secundum type atrial septal defect    Other chronic nonalcoholic liver disease    Other pulmonary embolism and infarction    Palpitations    Paroxysmal ventricular tachycardia (HCC)    Personal history of radiation therapy    PONV (postoperative nausea and vomiting)    Presence of permanent cardiac pacemaker    PUD (peptic ulcer disease)    Radiation 02/03/15-03/10/15   Right Breast   Sarcoid    per pt , not sure   Schatzki's ring    Shortness of breath  Sleep apnea    Stroke [facility]) 2013   tia/ pt feels it was around 2008 0r 2009   Takotsubo syndrome    Tubular adenoma of colon    Unspecified transient cerebral ischemia    Unspecified vitamin D deficiency     Past Surgical History:  Procedure Laterality Date   ABI  2006   normal   BREAST EXCISIONAL BIOPSY     BREAST LUMPECTOMY Right 2016   BREAST LUMPECTOMY WITH RADIOACTIVE SEED LOCALIZATION Right 01/03/2015   Procedure: BREAST LUMPECTOMY WITH RADIOACTIVE SEED LOCALIZATION;  Surgeon: Autumn Messing III, MD;  Location: [facility];  Service: General;  Laterality: Right;   CARDIAC DEFIBRILLATOR PLACEMENT  2006; 2012   BSX single chamber ICD   carotid dopplers  2006   neg   CEREBRAL ANEURYSM REPAIR  02/1999   COLONOSCOPY     HAMMER TOE SURGERY  11/15/2020   3rd digit bilateral feet    hospitalization  2004   GI bleed, PUD, diverticulosis (EGD,colonscopy)   hospitalization     PE, NSVT, s/p defib   KNEE ARTHROSCOPY     bilateral   LEFT HEART CATHETERIZATION WITH CORONARY ANGIOGRAM N/A 02/22/2012   Procedure: LEFT HEART CATHETERIZATION WITH CORONARY ANGIOGRAM;  Surgeon: Burnell Blanks, MD;  Location: [facility] CATH LAB;  Service: Cardiovascular;  Laterality: N/A;   PARTIAL HYSTERECTOMY     Fibroids   TONGUE BIOPSY  12/12/2017   due to sore tongue and white patches/abnormal cells   TOTAL KNEE  ARTHROPLASTY Left 02/13/2016   Procedure: TOTAL KNEE ARTHROPLASTY;  Surgeon: Vickey [place], MD;  Location: [facility];  Service: Orthopedics;  Laterality: Left;   TOTAL KNEE ARTHROPLASTY Right 05/22/2021   Procedure: TOTAL KNEE ARTHROPLASTY;  Surgeon: Vickey [place], MD;  Location: [facility];  Service: Orthopedics;  Laterality: Right;  with block   UPPER GASTROINTESTINAL ENDOSCOPY      Family History  Problem Relation Age of Onset   Diabetes Mother    Alzheimer's disease Mother    Hypertension Mother    Obesity Mother    Heart disease Father    Seizures Sister    Allergies Sister    Heart disease Sister    Other Brother        Thyroid problem 10/2016   Heart disease Brother    Prostate cancer Brother 54       same brother as throat cancer   Throat cancer Brother        dx in his 23s; also a smoker   Cancer Maternal Grandmother 10       colon cancer or abdominal cancer   Lung cancer Maternal Grandfather 78   Heart disease Son        Cardiac Arrest 07/2016   Breast cancer Maternal Aunt 55   Colon cancer Maternal Aunt 61       same sister as breast at 85   Breast cancer Maternal Aunt        dx in her 104s   Lung cancer Maternal Uncle    Pancreatic cancer Cousin 88       maternal first cousin   Breast cancer Cousin        paternal first cousin twice removed died in her 15s   Stroke Neg Hx     Social History   Socioeconomic History   Marital status: Divorced    Spouse name: Not on file   Number of children: 2   Years of education: 14   Highest education level:  Not on file  Occupational History   Occupation: DISABLED  Tobacco Use   Smoking status: Former    Packs/day: 1.00    Years: 15.00    Total pack years: 15.00    Types: Cigarettes    Quit date: 08/20/1989    Years since quitting: 32.9    Passive exposure: Past   Smokeless tobacco: Never  Vaping Use   Vaping Use: Never used  Substance and Sexual Activity   Alcohol use: Yes    Alcohol/week: 3.0 standard drinks of  alcohol    Types: 3 Glasses of wine per week    Comment: occasionally, not on a weekly basis   Drug use: Yes    Frequency: 1.0 times per week    Types: Marijuana    Comment: CBD gummy occ; marijuana occasionally   Sexual activity: Not Currently  Other Topics Concern   Not on file  Social History Narrative   Lives alone   Caffeine RWE:RXVQ   Retired from [employer]   2 children -- 5 grandbabies - 1 great-grandbaby    Social Determinants of Health   Financial Resource Strain: Low Risk  (12/08/2020)   Overall Financial Resource Strain (CARDIA)    Difficulty of Paying Living Expenses: Not hard at all  Food Insecurity: No Food Insecurity (12/08/2020)   Hunger Vital Sign    Worried About Running Out of Food in the Last Year: Never true    Ran Out of Food in the Last Year: Never true  Transportation Needs: No Transportation Needs (12/08/2020)   PRAPARE - Hydrologist (Medical): No    Lack of Transportation (Non-Medical): No  Physical Activity: Inactive (12/08/2020)   Exercise Vital Sign    Days of Exercise per Week: 0 days    Minutes of Exercise per Session: 0 min  Stress: No Stress Concern Present (12/08/2020)   [place]    Feeling of Stress : Not at all  Social Connections: Moderately Isolated (12/08/2020)   Social Connection and Isolation Panel [NHANES]    Frequency of Communication with Friends and Family: More than three times a week    Frequency of Social Gatherings with Friends and Family: More than three times a week    Attends Religious Services: 1 to 4 times per year    Active Member of [employer] or Organizations: No    Attends Archivist Meetings: Never    Marital Status: Divorced  Human resources officer Violence: Not At Risk (12/08/2020)   Humiliation, Afraid, Rape, and Kick questionnaire    Fear of Current or Ex-Partner: No    Emotionally Abused: No    Physically Abused: No     Sexually Abused: No    Review of Systems  All other systems reviewed and are negative.       Objective    BP 130/62   Pulse 71   Temp 97.8 F (36.6 C) (Temporal)   Resp 16   Wt 207 lb (93.9 kg)   LMP  (LMP Unknown)   SpO2 96%   BMI 32.91 kg/m   Physical Exam Vitals and nursing note reviewed.  Constitutional:      General: She is not in acute distress. Cardiovascular:     Rate and Rhythm: Normal rate and regular rhythm.  Pulmonary:     Effort: Pulmonary effort is normal.     Breath sounds: Normal breath sounds.  Abdominal:     Palpations: Abdomen  is soft.     Tenderness: There is no abdominal tenderness.  Musculoskeletal:     Lumbar back: Tenderness present. No swelling or edema. Decreased range of motion.     Left hip: Tenderness present. Decreased range of motion.  Neurological:     General: No focal deficit present.     Mental Status: She is alert and oriented to person, place, and time.         Assessment & Plan:   1. Left-sided low back pain without sciatica, unspecified chronicity Kenalog and toradol injections given. Referral to PT for further eval/mgt. Exercises given. Tylenol/nsaids prn - triamcinolone acetonide (KENALOG-40) injection 40 mg - ketorolac (TORADOL) injection 60 mg - Ambulatory referral to Physical Therapy  2. Left hip pain As asbove - triamcinolone acetonide (KENALOG-40) injection 40 mg - ketorolac (TORADOL) injection 60 mg - Ambulatory referral to Physical Therapy  Return in about 6 weeks (around 09/04/2022).   Becky Sax, MD

## 2022-07-24 NOTE — Progress Notes (Signed)
Back and neck concerns after twisting around and falling x 78 days ago.Patient has taken baclofen and no relief. Patient has called her neurologist but wad instructed to see pcp

## 2022-07-31 NOTE — Progress Notes (Unsigned)
Patient: [name] Date of Birth: 10-Nov-1950  Reason for Visit: Follow up History from: Patient Primary Neurologist: Krista Blue   ASSESSMENT AND PLAN 71 y.o. year old female   65.  History of right brain aneurysm, s/p aneurysm clipping in 2001 2.  Intermittent left hand posturing 3.  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, cervical fusion C5-6, evidence of foraminal impingement at right C4-5, left C3-4 -Order NCV/EMG to better localize symptoms (gait, spasms BLE) -Side effect with Depakote, will switch to Keppra 500 mg twice a day, high risk for partial seizure given right frontotemporal craniotomy changes (no further left hand posturing) -EEG was normal October 2023 -CT cervical spine October 2023 showed foraminal impingement on the left at C3-4 and right at C4-5 -CT head October 2023 showed changes of right frontal craniotomy area of encephalomalacia -Has baclofen, encouraged to try at bedtime for muscle spasms  HISTORY  Dr. Krista Blue 05/04/22: [name] is a 71 year old female, seen in request by primary care nurse practitioner Minette Brine, Amy J, for evaluation of frequent left lower extremity muscle spasm, she was a patient of Dr. Jannifer Franklin, seen by Judson Roch in August 2021   I reviewed and summarized the referring note. PMHX. HLD CHF Stroke Breast Cancer, right lobectomy, radiation therapy in 2016 Cerebral aneurysm, s/p clip in 2001, History of right knee replacement in August 2022, left side was in 2016.   She has a history of right brain aneurysm, status post clipping in 2001, denies residual deficit, but reported a history of allergic reaction to Dilantin/Tegretol, developed a rash, has been off epileptic medications for many years, but she reported over the years, she has intermittent left hand posturing, she has no control over it for few minutes, denies loss of consciousness, denied generalized seizure activity   She is tearful during today's visit, she lost her friends of many  years, lives alone, still attends elderly patient in their home,   She has chronic neck pain, getting worse over the past few months, since the beginning of 2023, she complains of frequent bilateral lower extremity muscle spasm, toes go up, only happened early morning when she first get up from overnight sleep, and can be very painful, she put mustard seed at her nightstand, she has to pace around to alleviate the symptoms   She also complains of worsening urinary incontinence, urgency, sometimes cannot make it to the bathroom   Personally reviewed CT cervical spine in June 2019: Chronic C5-6 solid arthrodesis/ankylosis, chronic thoracic spine ankylosis at T2-3, progressive upper cervical disc endplate and facet degeneration moderate spinal stenosis C4-5, mild C3-4, variable degree of foraminal narrowing   I was not able to [place], CT head without contrast by neuro surgeon Dr. Glenna Fellows in July 2000, aneurysm clip is demonstrated at the skull base on the right with post craniotomy changes, in the right frontotemporal region,   Repeat CT head February 2001 postoperative change of the right has resolved, ventricular system is normal, surgical clip on the right, no acute abnormality   Laboratory evaluation August 2023, normal vitamin D, BMP, creatinine 0.86, A1c 5.7, CPK, CBC, mild elevated alkaline phosphate 122, creatinine 0.89  Update June 21, 2022 SS: EEG was normal October 2023. Last visit, Dr. Krista Blue started Depakote ER 500 mg, she feels tremor in her hands, feels nervous. No further left hand posturing. 1st thing in the morning, cramps in feet, her foot twisted out, is painful, takes mustard. Happens 3 times a week.   CT cervical spine  05/22/22 IMPRESSION: 1. Generalized cervical spine degeneration with up to mild spinal stenosis at C4-5. 2. Foraminal impingement on the left at C3-4 and right at C4-5. 3. C2-3 facet ankylosis since 2019.  Solid fusion at C5-6.   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  No acute abnormalities noted.  No abnormal areas of enhancement are noted.  REVIEW OF SYSTEMS: Out of a complete 14 system review of symptoms, the patient complains only of the following symptoms, and all other reviewed systems are negative.  See HPI  ALLERGIES: Allergies  Allergen Reactions   Ace Inhibitors Swelling    Angioedema; makes tongue "break out"    Amitriptyline Hcl Other (See Comments)     makes her too sleepy!   Atorvastatin Other (See Comments)    SEVERE MYALGIA   Cymbalta [Duloxetine Hcl] Nausea Only and Other (See Comments)    Sleepiness/ sick   Dilantin [Phenytoin Sodium Extended] Rash    Severe rash   Paroxetine Nausea Only    Rapid heartbeat   Ramipril Other (See Comments)    TONGUE ULCERS    Rosuvastatin Other (See Comments)    SEVERE MYALGIA   Carbamazepine Rash   Codeine Itching   Paxlovid [Nirmatrelvir-Ritonavir] Itching   Phenytoin Sodium Extended Rash   Simvastatin Other (See Comments)    Increase in CK, myalgias   Wellbutrin [Bupropion] Palpitations    HOME MEDICATIONS: Outpatient Medications Prior to Visit  Medication Sig Dispense Refill   acetaminophen (TYLENOL) 500 MG tablet Take 500 mg by mouth every 6 (six) hours as needed for moderate pain.     albuterol (PROVENTIL) (2.5 MG/3ML) 0.083% nebulizer solution USE 1 VIAL IN NEBULIZER EVERY 6 HOURS (Patient taking differently: Take 2.5 mg by nebulization every 6 (six) hours as needed for shortness of breath.) 120 mL 11   albuterol (VENTOLIN HFA) 108 (90 Base) MCG/ACT inhaler Inhale 2 puffs into the lungs every 6 (six) hours as needed for wheezing or shortness of breath. 18 g 3   Ascorbic Acid (VITAMIN C) 1000 MG tablet Take 1,000 mg by mouth daily.     aspirin EC 81 MG tablet  Take 81 mg by mouth daily. Swallow whole.     azelastine (ASTELIN) 0.1 % nasal spray Place 1 spray into both nostrils 2 (two) times daily. Use in each nostril as directed 90 mL 3   baclofen (LIORESAL) 10 MG tablet Take 1 tablet (10 mg total) by mouth at bedtime as needed for muscle spasms. 90 tablet 1   budesonide-formoterol (SYMBICORT) 80-4.5 MCG/ACT inhaler Inhale 1 puff into the lungs 2 (two) times daily. 1 each 12   cefUROXime (CEFTIN) 500 MG tablet Take 1 tablet (500 mg total) by mouth 2 (two) times daily with a meal. 14 tablet 0   cetirizine (ZYRTEC) 10 MG tablet Take 10 mg by mouth daily as needed for allergies.     Cholecalciferol (DIALYVITE VITAMIN D 5000) 125 MCG (5000 UT) capsule Take 5,000 Units by mouth daily.     diclofenac Sodium (VOLTAREN) 1 % GEL Apply 2-4 grams to affected joint 4 times daily as needed. (Patient taking differently: Apply 2-4 g topically 4 (four) times daily as needed (joint pain).) 400 g 2   diltiazem (CARDIZEM CD) 120 MG 24 hr capsule Take 1 capsule (120 mg total) by mouth daily. Do  Not start this medication until after you wean off Carvedilol. 90 capsule 2   Empagliflozin (JARDIANCE PO) Take by mouth.     ezetimibe (ZETIA) 10 MG tablet Take 1 tablet (10 mg total) by mouth daily. 90 tablet 3   famotidine (PEPCID) 40 MG tablet Take 1 tablet (40 mg total) by mouth at bedtime. 360 tablet 6   fexofenadine (ALLEGRA) 180 MG tablet Take 180 mg by mouth daily as needed for allergies (alternates with zyrtec sometimes).     fluticasone (FLONASE) 50 MCG/ACT nasal spray Place 2 sprays into both nostrils daily as needed for allergies or rhinitis. 48 g 3   FOLIC ACID PO Take 1 tablet by mouth daily.     furosemide (LASIX) 20 MG tablet TAKE 2 TABLETS (40 MG) 3 DAYS PER WEEK AND TAKE 1 TABLET (20 MG) THE OTHER DAYS OF THE WEEK 130 tablet 1   gabapentin (NEURONTIN) 100 MG capsule Take 1 capsule (100 mg total) by mouth 3 (three) times daily. 90 capsule 6   gabapentin (NEURONTIN)  300 MG capsule Take 1 capsule (300 mg total) by mouth at bedtime. 30 capsule 2   hydroxypropyl methylcellulose / hypromellose (ISOPTO TEARS / GONIOVISC) 2.5 % ophthalmic solution Place 1 drop into both eyes at bedtime.     JARDIANCE 10 MG TABS tablet Take 10 mg by mouth daily.     latanoprost (XALATAN) 0.005 % ophthalmic solution Place 1 drop into both eyes at bedtime.     levETIRAcetam (KEPPRA) 500 MG tablet Take 1 tablet (500 mg total) by mouth 2 (two) times daily. 180 tablet 1   levothyroxine (SYNTHROID) 50 MCG tablet TAKE 1 TABLET EVERY DAY 90 tablet 1   magnesium oxide (MAG-OX) 400 MG tablet Take 1 tablet (400 mg total) by mouth 2 (two) times daily. 180 tablet 3   mometasone (ELOCON) 0.1 % lotion Apply 1 application topically daily as needed (rash). 60 mL 1   montelukast (SINGULAIR) 10 MG tablet TAKE 1 TABLET AT BEDTIME 90 tablet 3   Multiple Vitamins-Minerals (MULTIVITAMIN PO) Take 1 tablet by mouth daily.     nitroGLYCERIN (NITROSTAT) 0.4 MG SL tablet DISSOLVE ONE TABLET UNDER THE TONGUE EVERY 5 MINUTES AS NEEDED FOR CHEST PAIN.  DO NOT EXCEED A TOTAL OF 3 DOSES IN 15 MINUTES (Patient taking differently: Place 0.4 mg under the tongue every 5 (five) minutes as needed for chest pain.) 25 tablet 0   nystatin (MYCOSTATIN) 100000 UNIT/ML suspension Take 5 mLs (500,000 Units total) by mouth 4 (four) times daily. 60 mL 0   pantoprazole (PROTONIX) 40 MG tablet Take 1 tablet (40 mg total) by mouth daily. 90 tablet 3   polyethylene glycol powder (GLYCOLAX/MIRALAX) powder Take 17 grams by mouth daily 1080 g 0   potassium chloride (KLOR-CON M) 10 MEQ tablet TAKE 1/2 TABLET EVERY DAY (CALL OFFICE TO SCHEDULE YEARLY APPT) 30 tablet 11   Respiratory Therapy Supplies (FLUTTER) DEVI 1 Device by Does not apply route as needed. 1 each 0   RSV vaccine recomb adjuvanted (AREXVY) 120 MCG/0.5ML injection Inject into the muscle. 0.5 mL 0   sodium chloride HYPERTONIC 3 % nebulizer solution Take by nebulization 2  (two) times daily as needed for cough. 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 750 mL 12   traMADol (ULTRAM) 50 MG tablet Take 1 tablet (50 mg total) by mouth every 12 (twelve) hours as needed. 180 tablet 1   traZODone (DESYREL) 100 MG tablet TAKE 1 TO 1 AND 1/2 TABLETS AT BEDTIME 135 tablet  1   No facility-administered medications prior to visit.    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   Bronchiectasis ([place])    Cerebral aneurysm, nonruptured    had a clip put in   CHF (congestive heart failure) (HCC)    Clostridium difficile infection    Depressive disorder, not elsewhere classified    Diverticulosis of colon (without mention of hemorrhage)    Esophageal reflux    Fatty liver    Fibromyalgia    Gastritis    GERD (gastroesophageal reflux disease)    GI bleed 2004   Glaucoma    Hiatal hernia    History of COVID-19 05/04/2021   Hyperlipidemia    Hypertension    Hypothyroidism    Internal hemorrhoids    Joint pain    Multifocal pneumonia 06/03/2021   Obstructive sleep apnea (adult) (pediatric)    Osteoarthritis    Ostium secundum type atrial septal defect    Other chronic nonalcoholic liver disease    Other pulmonary embolism and infarction    Palpitations    Paroxysmal ventricular tachycardia (HCC)    Personal history of radiation therapy    PONV (postoperative nausea and vomiting)    Presence of permanent cardiac pacemaker    PUD (peptic ulcer disease)    Radiation 02/03/15-03/10/15   Right Breast   Sarcoid    per pt , not sure   Schatzki's ring    Shortness of breath    Sleep apnea    Stroke [facility]) 2013   tia/ pt feels it was around 2008 0r 2009   Takotsubo syndrome    Tubular adenoma of colon    Unspecified transient cerebral ischemia    Unspecified vitamin D deficiency     PAST SURGICAL  HISTORY: Past Surgical History:  Procedure Laterality Date   ABI  2006   normal   BREAST EXCISIONAL BIOPSY     BREAST LUMPECTOMY Right 2016   BREAST LUMPECTOMY WITH RADIOACTIVE SEED LOCALIZATION Right 01/03/2015   Procedure: BREAST LUMPECTOMY WITH RADIOACTIVE SEED LOCALIZATION;  Surgeon: Autumn Messing III, MD;  Location: [facility];  Service: General;  Laterality: Right;   CARDIAC DEFIBRILLATOR PLACEMENT  2006; 2012   BSX single chamber ICD   carotid dopplers  2006   neg   CEREBRAL ANEURYSM REPAIR  02/1999   COLONOSCOPY     HAMMER TOE SURGERY  11/15/2020   3rd digit bilateral feet    hospitalization  2004   GI bleed, PUD, diverticulosis (EGD,colonscopy)   hospitalization     PE, NSVT, s/p defib   KNEE ARTHROSCOPY     bilateral   LEFT HEART CATHETERIZATION WITH CORONARY ANGIOGRAM N/A 02/22/2012   Procedure: LEFT HEART CATHETERIZATION WITH CORONARY ANGIOGRAM;  Surgeon: Burnell Blanks, MD;  Location: [facility] CATH LAB;  Service: Cardiovascular;  Laterality: N/A;   PARTIAL HYSTERECTOMY     Fibroids   TONGUE BIOPSY  12/12/2017   due to sore tongue and white patches/abnormal cells   TOTAL KNEE ARTHROPLASTY Left 02/13/2016   Procedure: TOTAL KNEE ARTHROPLASTY;  Surgeon: Vickey Huger, MD;  Location: [facility];  Service: Orthopedics;  Laterality: Left;   TOTAL KNEE ARTHROPLASTY Right 05/22/2021   Procedure: TOTAL KNEE ARTHROPLASTY;  Surgeon: Vickey Huger, MD;  Location: [facility];  Service: Orthopedics;  Laterality:  Right;  with block   UPPER GASTROINTESTINAL ENDOSCOPY      FAMILY HISTORY: Family History  Problem Relation Age of Onset   Diabetes Mother    Alzheimer's disease Mother    Hypertension Mother    Obesity Mother    Heart disease Father    Seizures Sister    Allergies Sister    Heart disease Sister    Other Brother        Thyroid problem 10/2016   Heart disease Brother    Prostate cancer Brother 47       same brother as throat cancer   Throat cancer Brother        dx in his 47s;  also a smoker   Cancer Maternal Grandmother 45       colon cancer or abdominal cancer   Lung cancer Maternal Grandfather 78   Heart disease Son        Cardiac Arrest 07/2016   Breast cancer Maternal Aunt 55   Colon cancer Maternal Aunt 61       same sister as breast at 6   Breast cancer Maternal Aunt        dx in her 66s   Lung cancer Maternal Uncle    Pancreatic cancer Cousin 7       maternal first cousin   Breast cancer Cousin        paternal first cousin twice removed died in her 24s   Stroke Neg Hx     SOCIAL HISTORY: Social History   Socioeconomic History   Marital status: Divorced    Spouse name: Not on file   Number of children: 2   Years of education: 14   Highest education level: Not on file  Occupational History   Occupation: DISABLED  Tobacco Use   Smoking status: Former    Packs/day: 1.00    Years: 15.00    Total pack years: 15.00    Types: Cigarettes    Quit date: 08/20/1989    Years since quitting: 32.9    Passive exposure: Past   Smokeless tobacco: Never  Vaping Use   Vaping Use: Never used  Substance and Sexual Activity   Alcohol use: Yes    Alcohol/week: 3.0 standard drinks of alcohol    Types: 3 Glasses of wine per week    Comment: occasionally, not on a weekly basis   Drug use: Yes    Frequency: 1.0 times per week    Types: Marijuana    Comment: CBD gummy occ; marijuana occasionally   Sexual activity: Not Currently  Other Topics Concern   Not on file  Social History Narrative   Lives alone   Caffeine TOI:ZTIW   Retired from [employer]   2 children -- 5 grandbabies - 1 great-grandbaby    Social Determinants of Health   Financial Resource Strain: Low Risk  (12/08/2020)   Overall Financial Resource Strain (CARDIA)    Difficulty of Paying Living Expenses: Not hard at all  Food Insecurity: No Food Insecurity (12/08/2020)   Hunger Vital Sign    Worried About Running Out of Food in the Last Year: Never true    Ran Out of Food in the Last  Year: Never true  Transportation Needs: No Transportation Needs (12/08/2020)   PRAPARE - Hydrologist (Medical): No    Lack of Transportation (Non-Medical): No  Physical Activity: Inactive (12/08/2020)   Exercise Vital Sign    Days of Exercise per Week:  0 days    Minutes of Exercise per Session: 0 min  Stress: No Stress Concern Present (12/08/2020)   National    Feeling of Stress : Not at all  Social Connections: Moderately Isolated (12/08/2020)   Social Connection and Isolation Panel [NHANES]    Frequency of Communication with Friends and Family: More than three times a week    Frequency of Social Gatherings with Friends and Family: More than three times a week    Attends Religious Services: 1 to 4 times per year    Active Member of [employer] or Organizations: No    Attends Archivist Meetings: Never    Marital Status: Divorced  Human resources officer Violence: Not At Risk (12/08/2020)   Humiliation, Afraid, Rape, and Kick questionnaire    Fear of Current or Ex-Partner: No    Emotionally Abused: No    Physically Abused: No    Sexually Abused: No   PHYSICAL EXAM  There were no vitals filed for this visit.  There is no height or weight on file to calculate BMI.  Generalized: Well developed, in no acute distress  Neurological examination  Mentation: Alert oriented to time, place, history taking. Follows all commands speech and language fluent Cranial nerve II-XII: Pupils were equal round reactive to light. Extraocular movements were full, visual field were full on confrontational test. Facial sensation and strength were normal. Head turning and shoulder shrug  were normal and symmetric. Motor: The motor testing reveals 5 over 5 strength of all 4 extremities. Good symmetric motor tone is noted throughout.  Sensory: Sensory testing is intact to soft touch on all 4 extremities. No evidence of  extinction is noted.  Coordination: Cerebellar testing reveals good finger-nose-finger and heel-to-shin bilaterally.  Gait and station: Gait is cautious, slightly wide-based tandem gait is unsteady. Reflexes: Deep tendon reflexes are symmetric, did not note brisk reflexes in the knees but was painful when attempted  DIAGNOSTIC DATA (LABS, IMAGING, TESTING) - I reviewed patient records, labs, notes, testing and imaging myself where available.  Lab Results  Component Value Date   WBC 3.9 (L) 07/17/2022   HGB 14.9 07/17/2022   HCT 45.0 07/17/2022   MCV 93.2 07/17/2022   PLT 166.0 07/17/2022      Component Value Date/Time   NA 140 07/17/2022 1505   NA 138 03/23/2022 1553   NA 141 12/01/2014 0847   K 3.9 07/17/2022 1505   K 3.8 12/01/2014 0847   CL 105 07/17/2022 1505   CO2 29 07/17/2022 1505   CO2 26 12/01/2014 0847   GLUCOSE 85 07/17/2022 1505   GLUCOSE 175 (H) 12/01/2014 0847   BUN 13 07/17/2022 1505   BUN 10 03/23/2022 1553   BUN 10.6 12/01/2014 0847   CREATININE 0.81 07/17/2022 1505   CREATININE 0.82 02/27/2017 1518   CREATININE 0.8 12/01/2014 0847   CALCIUM 9.9 07/17/2022 1505   CALCIUM 9.8 12/01/2014 0847   PROT 7.4 07/17/2022 1505   PROT 6.6 10/19/2021 0911   PROT 7.5 12/01/2014 0847   ALBUMIN 4.3 07/17/2022 1505   ALBUMIN 3.9 10/19/2021 0911   ALBUMIN 3.9 12/01/2014 0847   AST 40 (H) 07/17/2022 1505   AST 29 12/01/2014 0847   ALT 47 (H) 07/17/2022 1505   ALT 33 12/01/2014 0847   ALKPHOS 84 07/17/2022 1505   ALKPHOS 127 12/01/2014 0847   BILITOT 0.5 07/17/2022 1505   BILITOT 0.3 10/19/2021 0911   BILITOT 0.42 12/01/2014 0847  GFRNONAA >60 06/07/2021 0359   GFRNONAA 74 02/27/2017 1518   GFRAA 82 10/01/2019 1408   GFRAA 85 02/27/2017 1518   Lab Results  Component Value Date   CHOL 178 10/19/2021   HDL 51 10/19/2021   LDLCALC 109 (H) 10/19/2021   LDLDIRECT 141.8 07/04/2007   TRIG 100 10/19/2021   CHOLHDL 3.5 10/19/2021   Lab Results  Component Value  Date   HGBA1C 5.3 06/27/2022   Lab Results  Component Value Date   CBULAGTX64 680 10/01/2019   Lab Results  Component Value Date   TSH 1.55 04/10/2021    Butler Denmark, AGNP-C, DNP 07/31/2022, 8:24 PM [facility] [address] [place], [place] [address] [phone]

## 2022-08-01 ENCOUNTER — Encounter: Payer: Self-pay | Admitting: Neurology

## 2022-08-01 ENCOUNTER — Ambulatory Visit (INDEPENDENT_AMBULATORY_CARE_PROVIDER_SITE_OTHER): Payer: Medicare Other | Admitting: Neurology

## 2022-08-01 ENCOUNTER — Encounter: Payer: Self-pay | Admitting: Podiatry

## 2022-08-01 ENCOUNTER — Ambulatory Visit (INDEPENDENT_AMBULATORY_CARE_PROVIDER_SITE_OTHER): Payer: Medicare Other | Admitting: Podiatry

## 2022-08-01 VITALS — BP 149/82 | HR 71 | Ht 66.0 in | Wt 207.8 lb

## 2022-08-01 DIAGNOSIS — M797 Fibromyalgia: Secondary | ICD-10-CM

## 2022-08-01 DIAGNOSIS — M542 Cervicalgia: Secondary | ICD-10-CM | POA: Diagnosis not present

## 2022-08-01 DIAGNOSIS — L84 Corns and callosities: Secondary | ICD-10-CM | POA: Diagnosis not present

## 2022-08-01 DIAGNOSIS — G894 Chronic pain syndrome: Secondary | ICD-10-CM | POA: Diagnosis not present

## 2022-08-01 NOTE — Patient Instructions (Signed)
Get started in PT Use heat, massage, Tylenol/NSAIDs as needed Let us know if pain does not improve
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
## 2022-08-02 NOTE — Progress Notes (Signed)
Subjective:   Patient ID: [name], female   DOB: 71 y.o.   MRN: [id number]   HPI Patient presents with very painful calluses on both feet that come back very quickly   ROS      Objective:  Physical Exam  Neurovascular status intact thick keratotic lesions plantar aspect both feet around the first and fifth metatarsals painful     Assessment:  Chronic keratotic lesion secondary to pressure with skin structural issues     Plan:  Debridement accomplished do not recommend surgery currently

## 2022-08-03 ENCOUNTER — Ambulatory Visit: Payer: Medicare Other | Attending: Family Medicine

## 2022-08-03 ENCOUNTER — Other Ambulatory Visit: Payer: Self-pay

## 2022-08-03 DIAGNOSIS — M6281 Muscle weakness (generalized): Secondary | ICD-10-CM | POA: Diagnosis present

## 2022-08-03 DIAGNOSIS — R252 Cramp and spasm: Secondary | ICD-10-CM | POA: Diagnosis present

## 2022-08-03 DIAGNOSIS — G8929 Other chronic pain: Secondary | ICD-10-CM

## 2022-08-03 DIAGNOSIS — M545 Low back pain, unspecified: Secondary | ICD-10-CM | POA: Diagnosis present

## 2022-08-03 DIAGNOSIS — M25552 Pain in left hip: Secondary | ICD-10-CM | POA: Diagnosis present
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
## 2022-08-06 ENCOUNTER — Ambulatory Visit (INDEPENDENT_AMBULATORY_CARE_PROVIDER_SITE_OTHER): Payer: Medicare Other | Admitting: Family

## 2022-08-06 VITALS — BP 130/88 | HR 80 | Temp 98.3°F | Resp 16 | Wt 205.0 lb

## 2022-08-06 DIAGNOSIS — L299 Pruritus, unspecified: Secondary | ICD-10-CM

## 2022-08-06 DIAGNOSIS — N63 Unspecified lump in unspecified breast: Secondary | ICD-10-CM

## 2022-08-06 DIAGNOSIS — Z853 Personal history of malignant neoplasm of breast: Secondary | ICD-10-CM

## 2022-08-06 MED ORDER — FLUTICASONE PROPIONATE 0.05 % EX CREA: TOPICAL_CREAM | Freq: Two times a day (BID) | CUTANEOUS | 2 refills | Status: DC

## 2022-08-06 MED ORDER — TRIAMCINOLONE ACETONIDE 0.1 % EX CREA: 1.0000 | TOPICAL_CREAM | Freq: Two times a day (BID) | CUTANEOUS | 0 refills | Status: DC

## 2022-08-06 NOTE — Progress Notes (Signed)
Pt presents for advice of whether she should take Meloxicam -has lump on right breast

## 2022-08-07 ENCOUNTER — Encounter: Payer: Self-pay | Admitting: Family

## 2022-08-09 ENCOUNTER — Ambulatory Visit: Payer: Medicare Other | Admitting: Family Medicine

## 2022-08-10 ENCOUNTER — Ambulatory Visit: Payer: Medicare Other | Admitting: Neurology

## 2022-08-10 ENCOUNTER — Ambulatory Visit (INDEPENDENT_AMBULATORY_CARE_PROVIDER_SITE_OTHER): Payer: Medicare Other | Admitting: Neurology

## 2022-08-10 VITALS — BP 135/68 | HR 71 | Ht 66.0 in | Wt 205.0 lb

## 2022-08-10 DIAGNOSIS — M545 Low back pain, unspecified: Secondary | ICD-10-CM

## 2022-08-10 DIAGNOSIS — G40209 Localization-related (focal) (partial) symptomatic epilepsy and epileptic syndromes with complex partial seizures, not intractable, without status epilepticus: Secondary | ICD-10-CM | POA: Diagnosis not present

## 2022-08-10 DIAGNOSIS — I671 Cerebral aneurysm, nonruptured: Secondary | ICD-10-CM

## 2022-08-10 DIAGNOSIS — M62838 Other muscle spasm: Secondary | ICD-10-CM

## 2022-08-10 DIAGNOSIS — R269 Unspecified abnormalities of gait and mobility: Secondary | ICD-10-CM

## 2022-08-10 MED ORDER — CLOBAZAM 10 MG PO TABS: 10.0000 mg | ORAL_TABLET | Freq: Two times a day (BID) | ORAL | 5 refills | Status: DC

## 2022-08-10 NOTE — Procedures (Signed)
Full Name: [name] Gender: Female MRN #: [id number] Date of Birth: 02-06-1951    Visit Date: 08/10/2022 09:46 Age: 71 Years Examining Physician: Dr. Marcial Pacas Referring Physician: Butler Denmark, NP Height: 5 feet 6 inch History: 71 year old female complains of frequent lower extremity muscle cramping, weaker almost every night, bilateral hand intermittent paresthesia, right worse than left  Summary of the test  Nerve conduction study: Bilateral median, ulnar sensory and mixed responses were normal.  Left sural superficial peroneal sensory responses were normal.  Left peroneal to EDB, tibial, bilateral median and ulnar motor responses were normal.  Electromyography: Selected needle examinations of bilateral lower extremity muscles and bilateral lumbosacral paraspinal muscles, right upper extremity muscles were performed.  There is evidence of mild chronic neuropathic changes involving bilateral L4,5 S1 myotomes.  There was no spontaneous activity at bilateral lumbosacral paraspinal muscles.  Selected needle examination of right upper extremity muscles were normal.  Conclusion: This is a slight abnormal study.  There is electrodiagnostic evidence of mild chronic neuropathic changes involving bilateral L4-5 S1 myotomes.  There is no evidence of large fiber peripheral neuropathy or right cervical radiculopathy.    ------------------------------- Marcial Pacas M.D. PhD  [facility] [address], [place], [place] [address] Tel: [phone] Fax: [phone]  Verbal informed consent was obtained from the patient, patient was informed of potential risk of procedure, including bruising, bleeding, hematoma formation, infection, muscle weakness, muscle pain, numbness, among others.        [place]    Nerve / Sites Muscle Latency Ref. Amplitude Ref. Rel Amp Segments Distance Velocity Ref. Area    ms ms mV mV %  cm m/s m/s mVms  L Median - APB      Wrist APB 3.9 ?4.4 9.3 ?4.0 100 Wrist - APB 7   40.9     Upper arm APB 8.2  9.2  98.6 Upper arm - Wrist 22 51 ?49 39.9  R Median - APB     Wrist APB 3.4 ?4.4 9.4 ?4.0 100 Wrist - APB 7   32.2     Upper arm APB 7.7  9.0  95.8 Upper arm - Wrist 21 48 ?49 32.3  L Ulnar - ADM     Wrist ADM 3.2 ?3.3 10.5 ?6.0 100 Wrist - ADM 7   44.0     B.Elbow ADM 5.3  9.3  88.7 B.Elbow - Wrist 11 52 ?49 33.6     A.Elbow ADM 8.4  8.5  91.4 A.Elbow - B.Elbow 16 51 ?49 39.0  R Ulnar - ADM     Wrist ADM 3.2 ?3.3 10.2 ?6.0 100 Wrist - ADM 7   28.7     B.Elbow ADM 5.4  10.3  102 B.Elbow - Wrist 14 63 ?49 33.3     A.Elbow ADM 7.7  10.2  98.1 A.Elbow - B.Elbow 13 57 ?49 33.9  L Peroneal - EDB     Ankle EDB 4.0 ?6.5 8.3 ?2.0 100 Ankle - EDB 9   26.5     Fib head EDB 10.8  7.8  93.7 Fib head - Ankle 27 40 ?44 25.5     Pop fossa EDB 12.6  7.6  98.5 Pop fossa - Fib head 10 56 ?44 24.9         Pop fossa - Ankle      L Tibial - AH     Ankle AH 4.4 ?5.8 4.1 ?4.0 100 Ankle - AH 9  6.5     Pop fossa AH 15.2  4.0  97.6 Pop fossa - Ankle 40 37 ?41 11.0                 SNC    Nerve / Sites Rec. Site Peak Lat Ref.  Amp Ref. Segments Distance Peak Diff Ref.    ms ms V V  cm ms ms  L Sural - Ankle (Calf)     Calf Ankle 2.3 ?4.4 5 ?6 Calf - Ankle 14    L Superficial peroneal - Ankle     Lat leg Ankle 2.6 ?4.4 5 ?6 Lat leg - Ankle 14    R Median, Ulnar - Transcarpal comparison     Median Palm Wrist 2.1 ?2.2 37 ?35 Median Palm - Wrist 8       Ulnar Palm Wrist 2.0 ?2.2 9 ?12 Ulnar Palm - Wrist 8          Median Palm - Ulnar Palm  0.2 ?0.4  L Median - Orthodromic (Dig II, Mid palm)     Dig II Wrist 3.6 ?3.4 11 ?10 Dig II - Wrist 13    R Median - Orthodromic (Dig II, Mid palm)     Dig II Wrist 3.2 ?3.4 11 ?10 Dig II - Wrist 13    L Ulnar - Orthodromic, (Dig V, Mid palm)     Dig V Wrist 3.0 ?3.1 6 ?5 Dig V - Wrist 11    R Ulnar - Orthodromic, (Dig V, Mid palm)     Dig V Wrist 3.0 ?3.1 6 ?5 Dig V - Wrist 30                      F  Wave    Nerve F Lat Ref.   ms ms  L Ulnar - ADM 29.2 ?32.0  L Tibial - AH 39.9 ?56.0  R Ulnar - ADM 30.5 ?32.0           EMG Summary Table    Spontaneous MUAP Recruitment  Muscle IA Fib PSW Fasc Other Amp Dur. Poly Pattern  R. Tibialis anterior Normal None None None _______ Normal Normal Normal Reduced  R. Tibialis posterior Normal None None None _______ Normal Normal Normal Reduced  R. Vastus lateralis Normal None None None _______ Normal Normal Normal Reduced  R. Vastus medialis Normal None None None _______ Normal Normal Normal Reduced  R. Gastrocnemius (Medial head) Normal None None None _______ Normal Normal Normal Reduced  L. Tibialis anterior Normal None None None _______ Normal Normal Normal Reduced  L. Tibialis posterior Normal None None None _______ Normal Normal Normal Reduced  L. Gastrocnemius (Medial head) Normal None None None _______ Normal Normal Normal Reduced  L. Vastus lateralis Normal None None None _______ Normal Normal Normal Reduced  R. Lumbar paraspinals (low) Normal None None None _______ Normal Normal Normal Normal  R. Lumbar paraspinals (mid) Normal None None None _______ Normal Normal Normal Normal  L. Lumbar paraspinals (low) Normal None None None _______ Normal Normal Normal Normal  L. Lumbar paraspinals (mid) Normal None None None _______ Normal Normal Normal Normal  R. First dorsal interosseous Normal None None None _______ Normal Normal Normal Normal  R. Pronator teres Normal None None None _______ Normal Normal Normal Normal  R. Biceps brachii Normal None None None _______ Normal Normal Normal Normal  R. Deltoid Normal None None None _______ Normal Normal Normal Normal  R. Triceps brachii Normal None None None _______  Normal Normal Normal Normal  R. Brachioradialis Normal None None None _______ Normal Normal Normal Normal

## 2022-08-10 NOTE — Progress Notes (Signed)
ASSESSMENT AND PLAN 71 y.o. 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,  Presenting with left hand posturing, much improved with Keppra 500 mg twice daily, but complains of feeling nervousness with Keppra, severe allergic reaction to multiple anti-epileptic medications in the past, including diffuse body rash with Benadryl, Tegretol, will try onfi titrating to 10 mg twice daily to replace Keppra  Frequent lower extremity muscle cramping, polypharmacy treatment at nighttime Worsening low back pain  EMG nerve conduction study today showed mild chronic lumbar radiculopathy  CT lumbar spine  Went over her medication list in detail, stop baclofen, higher dose of gabapentin,   Return To Clinic With NP In 6 Months   HISTORY  [name] is a 71 year old female, seen in request by primary care nurse practitioner Minette Brine, Amy J, for evaluation of frequent left lower extremity muscle spasm, she was a patient of Dr. Jannifer Franklin, seen by Judson Roch in August 2021   I reviewed and summarized the referring note. PMHX. HLD CHF Stroke Breast Cancer, right lobectomy, radiation therapy in 2016 Cerebral aneurysm, s/p clip in 2001, History of right knee replacement in August 2022, left side was in 2016.   She has a history of right brain aneurysm, status post clipping in 2001, denies residual deficit, but reported a history of allergic reaction to Dilantin/Tegretol, developed a rash, has been off epileptic medications for many years, but she reported over the years, she has intermittent left hand posturing, she has no control over it for few minutes, denies loss of consciousness, denied generalized seizure activity   She is tearful during today's visit, she lost her friends of many years, lives alone, still attends elderly patient in their home,   She has chronic neck pain, getting worse over the past few months,  since the beginning of 2023, she complains of frequent bilateral lower extremity muscle spasm, toes go up, only happened early morning when she first get up from overnight sleep, and can be very painful, she put mustard seed at her nightstand, she has to pace around to alleviate the symptoms   She also complains of worsening urinary incontinence, urgency, sometimes cannot make it to the bathroom   Personally reviewed CT cervical spine in June 2019: Chronic C5-6 solid arthrodesis/ankylosis, chronic thoracic spine ankylosis at T2-3, progressive upper cervical disc endplate and facet degeneration moderate spinal stenosis C4-5, mild C3-4, variable degree of foraminal narrowing   I was not able to [place], CT head without contrast by neuro surgeon Dr. Glenna Fellows in July 2000, aneurysm clip is demonstrated at the skull base on the right with post craniotomy changes, in the right frontotemporal region,   Repeat CT head February 2001 postoperative change of the right has resolved, ventricular system is normal, surgical clip on the right, no acute abnormality   Laboratory evaluation August 2023, normal vitamin D, BMP, creatinine 0.86, A1c 5.7, CPK, CBC, mild elevated alkaline phosphate 122, creatinine 0.89  Update June 21, 2022 SS: EEG was normal October 2023. Last visit, Dr. Krista Blue started Depakote ER 500 mg, she feels tremor in her hands, feels nervous. No further left hand posturing. 1st thing in the morning, cramps in feet, her foot twisted out, is painful, takes mustard. Happens 3 times a week.   CT cervical spine  05/22/22 IMPRESSION: 1. Generalized cervical spine degeneration with up to mild spinal stenosis at C4-5. 2. Foraminal impingement on the left at  C3-4 and right at C4-5. 3. C2-3 facet ankylosis since 2019.  Solid fusion at C5-6.   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  No acute abnormalities noted.  No abnormal areas of enhancement are noted.  UPDATE Aug 10 2022: Since she was started on Keppra, had much less left hand posturing, but still have intermittent bilateral foot muscle spasm   Personally reviewed CT head without contrast October 23, evidence of right frontal craniotomy with adjacent encephalomalacia metallic artifact, there was no acute abnormality  She complains of keppra make her feel nervous.  Allergic reaction to multiple medications in the past including Tegretol, Dilantin, whole-body rash  CT cervical spine showed generalized cervical spine degeneration, mild canal stenosis C4-5, variable degree of foraminal narrowing, solid fusion at C5-6  She has been on July 18, 2022, complains of worsening low back pain,  She already has frequent almost every night bilateral lower extremity muscle cramping, multiple bilateral toe extension, she also complains of worsening gait abnormality.  She had right knee replacement in 2022, no significant pain, now with left hip pain after fall, use walker, does have worsening urinary frequency and urgency.   REVIEW OF SYSTEMS: Out of a complete 14 system review of symptoms, the patient complains only of the following symptoms, and all other reviewed systems are negative.  See HPI  PHYSICAL EXAM  Vitals:   08/10/22 0949  BP: 135/68  Pulse: 71  Weight: 205 lb (93 kg)  Height: '5\' 6"'$  (1.676 m)   Body mass index is 33.09 kg/m.   PHYSICAL EXAMNIATION:  Gen: NAD, conversant, well nourised, well groomed                     Cardiovascular: Regular rate rhythm, no peripheral edema, warm, nontender. Eyes: Conjunctivae clear without exudates or hemorrhage Neck: Supple, no carotid bruits. Pulmonary: Clear to auscultation bilaterally   NEUROLOGICAL EXAM:  MENTAL STATUS: Speech/cognition: Awake, alert oriented to  history taking and casual conversation  CRANIAL NERVES: CN II: Visual fields are full to confrontation.  Pupils are round equal and briskly reactive to light. CN III, IV, VI: extraocular movement are normal. No ptosis. CN V: Facial sensation is intact to pinprick in all 3 divisions bilaterally. Corneal responses are intact.  CN VII: Face is symmetric with normal eye closure and smile. CN VIII: Hearing is normal to casual conversation CN IX, X: Palate elevates symmetrically. Phonation is normal. CN XI: Head turning and shoulder shrug are intact CN XII: Tongue is midline with normal movements and no atrophy.  MOTOR: There is no pronator drift of out-stretched arms. Muscle bulk and tone are normal. Muscle strength is normal.  REFLEXES: Reflexes are 1  and symmetric at the biceps, triceps, knees, and trace at ankles. Plantar responses are flexor.  SENSORY: Mild length-dependent light touch vibratory sensation  COORDINATION: Rapid alternating movements and fine finger movements are intact. There is no dysmetria on finger-to-nose and heel-knee-shin.    GAIT/STANCE: Need push-up to get up from sitting position, cautious     ALLERGIES: Allergies  Allergen Reactions   Ace Inhibitors Swelling    Angioedema; makes tongue "break out"    Amitriptyline Hcl Other (See Comments)     makes her too sleepy!   Atorvastatin Other (See Comments)    SEVERE MYALGIA  Cymbalta [Duloxetine Hcl] Nausea Only and Other (See Comments)    Sleepiness/ sick   Dilantin [Phenytoin Sodium Extended] Rash    Severe rash   Paroxetine Nausea Only    Rapid heartbeat   Ramipril Other (See Comments)    TONGUE ULCERS    Rosuvastatin Other (See Comments)    SEVERE MYALGIA   Carbamazepine Rash   Codeine Itching   Paxlovid [Nirmatrelvir-Ritonavir] Itching   Phenytoin Sodium Extended Rash   Simvastatin Other (See Comments)    Increase in CK, myalgias   Wellbutrin [Bupropion] Palpitations    HOME  MEDICATIONS: Outpatient Medications Prior to Visit  Medication Sig Dispense Refill   acetaminophen (TYLENOL) 500 MG tablet Take 500 mg by mouth every 6 (six) hours as needed for moderate pain.     albuterol (PROVENTIL) (2.5 MG/3ML) 0.083% nebulizer solution USE 1 VIAL IN NEBULIZER EVERY 6 HOURS (Patient taking differently: Take 2.5 mg by nebulization every 6 (six) hours as needed for shortness of breath.) 120 mL 11   albuterol (VENTOLIN HFA) 108 (90 Base) MCG/ACT inhaler Inhale 2 puffs into the lungs every 6 (six) hours as needed for wheezing or shortness of breath. 18 g 3   Ascorbic Acid (VITAMIN C) 1000 MG tablet Take 1,000 mg by mouth daily.     aspirin EC 81 MG tablet Take 81 mg by mouth daily. Swallow whole.     azelastine (ASTELIN) 0.1 % nasal spray Place 1 spray into both nostrils 2 (two) times daily. Use in each nostril as directed 90 mL 3   baclofen (LIORESAL) 10 MG tablet Take 1 tablet (10 mg total) by mouth at bedtime as needed for muscle spasms. 90 tablet 1   budesonide-formoterol (SYMBICORT) 80-4.5 MCG/ACT inhaler Inhale 1 puff into the lungs 2 (two) times daily. 1 each 12   cefUROXime (CEFTIN) 500 MG tablet Take 1 tablet (500 mg total) by mouth 2 (two) times daily with a meal. 14 tablet 0   cetirizine (ZYRTEC) 10 MG tablet Take 10 mg by mouth daily as needed for allergies.     Cholecalciferol (DIALYVITE VITAMIN D 5000) 125 MCG (5000 UT) capsule Take 5,000 Units by mouth daily.     diclofenac Sodium (VOLTAREN) 1 % GEL Apply 2-4 grams to affected joint 4 times daily as needed. (Patient taking differently: Apply 2-4 g topically 4 (four) times daily as needed (joint pain).) 400 g 2   diltiazem (CARDIZEM CD) 120 MG 24 hr capsule Take 1 capsule (120 mg total) by mouth daily. Do Not start this medication until after you wean off Carvedilol. 90 capsule 2   Empagliflozin (JARDIANCE PO) Take by mouth.     ezetimibe (ZETIA) 10 MG tablet Take 1 tablet (10 mg total) by mouth daily. 90 tablet 3    famotidine (PEPCID) 40 MG tablet Take 1 tablet (40 mg total) by mouth at bedtime. 360 tablet 6   fexofenadine (ALLEGRA) 180 MG tablet Take 180 mg by mouth daily as needed for allergies (alternates with zyrtec sometimes).     fluticasone (CUTIVATE) 0.05 % cream Apply topically 2 (two) times daily. 60 g 2   fluticasone (FLONASE) 50 MCG/ACT nasal spray Place 2 sprays into both nostrils daily as needed for allergies or rhinitis. 48 g 3   FOLIC ACID PO Take 1 tablet by mouth daily.     furosemide (LASIX) 20 MG tablet TAKE 2 TABLETS (40 MG) 3 DAYS PER WEEK AND TAKE 1 TABLET (20 MG) THE OTHER DAYS OF THE WEEK 130 tablet  1   gabapentin (NEURONTIN) 100 MG capsule Take 1 capsule (100 mg total) by mouth 3 (three) times daily. 90 capsule 6   gabapentin (NEURONTIN) 300 MG capsule Take 1 capsule (300 mg total) by mouth at bedtime. 30 capsule 2   hydroxypropyl methylcellulose / hypromellose (ISOPTO TEARS / GONIOVISC) 2.5 % ophthalmic solution Place 1 drop into both eyes at bedtime.     JARDIANCE 10 MG TABS tablet Take 10 mg by mouth daily.     latanoprost (XALATAN) 0.005 % ophthalmic solution Place 1 drop into both eyes at bedtime.     levETIRAcetam (KEPPRA) 500 MG tablet Take 1 tablet (500 mg total) by mouth 2 (two) times daily. 180 tablet 1   levothyroxine (SYNTHROID) 50 MCG tablet TAKE 1 TABLET EVERY DAY 90 tablet 1   magnesium oxide (MAG-OX) 400 MG tablet Take 1 tablet (400 mg total) by mouth 2 (two) times daily. 180 tablet 3   meloxicam (MOBIC) 15 MG tablet Take 15 mg by mouth daily.     mometasone (ELOCON) 0.1 % lotion Apply 1 application topically daily as needed (rash). 60 mL 1   montelukast (SINGULAIR) 10 MG tablet TAKE 1 TABLET AT BEDTIME 90 tablet 3   Multiple Vitamins-Minerals (MULTIVITAMIN PO) Take 1 tablet by mouth daily.     nitroGLYCERIN (NITROSTAT) 0.4 MG SL tablet DISSOLVE ONE TABLET UNDER THE TONGUE EVERY 5 MINUTES AS NEEDED FOR CHEST PAIN.  DO NOT EXCEED A TOTAL OF 3 DOSES IN 15 MINUTES  (Patient taking differently: Place 0.4 mg under the tongue every 5 (five) minutes as needed for chest pain.) 25 tablet 0   nystatin (MYCOSTATIN) 100000 UNIT/ML suspension Take 5 mLs (500,000 Units total) by mouth 4 (four) times daily. 60 mL 0   pantoprazole (PROTONIX) 40 MG tablet Take 1 tablet (40 mg total) by mouth daily. 90 tablet 3   polyethylene glycol powder (GLYCOLAX/MIRALAX) powder Take 17 grams by mouth daily 1080 g 0   potassium chloride (KLOR-CON M) 10 MEQ tablet TAKE 1/2 TABLET EVERY DAY (CALL OFFICE TO SCHEDULE YEARLY APPT) 30 tablet 11   Respiratory Therapy Supplies (FLUTTER) DEVI 1 Device by Does not apply route as needed. 1 each 0   RSV vaccine recomb adjuvanted (AREXVY) 120 MCG/0.5ML injection Inject into the muscle. 0.5 mL 0   sodium chloride HYPERTONIC 3 % nebulizer solution Take by nebulization 2 (two) times daily as needed for cough. 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 750 mL 12   traMADol (ULTRAM) 50 MG tablet Take 1 tablet (50 mg total) by mouth every 12 (twelve) hours as needed. 180 tablet 1   traZODone (DESYREL) 100 MG tablet TAKE 1 TO 1 AND 1/2 TABLETS AT BEDTIME 135 tablet 1   triamcinolone cream (KENALOG) 0.1 % Apply 1 Application topically 2 (two) times daily. 453.6 g 0   No facility-administered medications prior to visit.    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   Bronchiectasis ([facility])    Cerebral aneurysm, nonruptured    had a clip put in   CHF (congestive heart failure) (HCC)    Clostridium difficile infection    Depressive disorder, not elsewhere classified    Diverticulosis of colon (without mention of hemorrhage)    Esophageal reflux    Fatty liver    Fibromyalgia  Gastritis    GERD (gastroesophageal reflux disease)    GI bleed 2004   Glaucoma    Hiatal hernia    History of  COVID-19 05/04/2021   Hyperlipidemia    Hypertension    Hypothyroidism    Internal hemorrhoids    Joint pain    Multifocal pneumonia 06/03/2021   Obstructive sleep apnea (adult) (pediatric)    Osteoarthritis    Ostium secundum type atrial septal defect    Other chronic nonalcoholic liver disease    Other pulmonary embolism and infarction    Palpitations    Paroxysmal ventricular tachycardia (HCC)    Personal history of radiation therapy    PONV (postoperative nausea and vomiting)    Presence of permanent cardiac pacemaker    PUD (peptic ulcer disease)    Radiation 02/03/15-03/10/15   Right Breast   Sarcoid    per pt , not sure   Schatzki's ring    Shortness of breath    Sleep apnea    Stroke [facility]) 2013   tia/ pt feels it was around 2008 0r 2009   Takotsubo syndrome    Tubular adenoma of colon    Unspecified transient cerebral ischemia    Unspecified vitamin D deficiency     PAST SURGICAL HISTORY: Past Surgical History:  Procedure Laterality Date   ABI  2006   normal   BREAST EXCISIONAL BIOPSY     BREAST LUMPECTOMY Right 2016   BREAST LUMPECTOMY WITH RADIOACTIVE SEED LOCALIZATION Right 01/03/2015   Procedure: BREAST LUMPECTOMY WITH RADIOACTIVE SEED LOCALIZATION;  Surgeon: Autumn Messing III, MD;  Location: [facility];  Service: General;  Laterality: Right;   CARDIAC DEFIBRILLATOR PLACEMENT  2006; 2012   BSX single chamber ICD   carotid dopplers  2006   neg   CEREBRAL ANEURYSM REPAIR  02/1999   COLONOSCOPY     HAMMER TOE SURGERY  11/15/2020   3rd digit bilateral feet    hospitalization  2004   GI bleed, PUD, diverticulosis (EGD,colonscopy)   hospitalization     PE, NSVT, s/p defib   KNEE ARTHROSCOPY     bilateral   LEFT HEART CATHETERIZATION WITH CORONARY ANGIOGRAM N/A 02/22/2012   Procedure: LEFT HEART CATHETERIZATION WITH CORONARY ANGIOGRAM;  Surgeon: Burnell Blanks, MD;  Location[facility] CATH LAB;  Service: Cardiovascular;  Laterality: N/A;   PARTIAL HYSTERECTOMY      Fibroids   TONGUE BIOPSY  12/12/2017   due to sore tongue and white patches/abnormal cells   TOTAL KNEE ARTHROPLASTY Left 02/13/2016   Procedure: TOTAL KNEE ARTHROPLASTY;  Surgeon: Vickey Huger, MD;  Location: [facility];  Service: Orthopedics;  Laterality: Left;   TOTAL KNEE ARTHROPLASTY Right 05/22/2021   Procedure: TOTAL KNEE ARTHROPLASTY;  Surgeon: Vickey Huger, MD;  Location: [facility];  Service: Orthopedics;  Laterality: Right;  with block   UPPER GASTROINTESTINAL ENDOSCOPY      FAMILY HISTORY: Family History  Problem Relation Age of Onset   Diabetes Mother    Alzheimer's disease Mother    Hypertension Mother    Obesity Mother    Heart disease Father    Seizures Sister    Allergies Sister    Heart disease Sister    Other Brother        Thyroid problem 10/2016   Heart disease Brother    Prostate cancer Brother 32       same brother as throat cancer   Throat cancer Brother        dx in his 47s;  also a smoker   Cancer Maternal Grandmother 65       colon cancer or abdominal cancer   Lung cancer Maternal Grandfather 78   Heart disease Son        Cardiac Arrest 07/2016   Breast cancer Maternal Aunt 55   Colon cancer Maternal Aunt 61       same sister as breast at 41   Breast cancer Maternal Aunt        dx in her 13s   Lung cancer Maternal Uncle    Pancreatic cancer Cousin 63       maternal first cousin   Breast cancer Cousin        paternal first cousin twice removed died in her 109s   Stroke Neg Hx     SOCIAL HISTORY: Social History   Socioeconomic History   Marital status: Divorced    Spouse name: Not on file   Number of children: 2   Years of education: 14   Highest education level: Not on file  Occupational History   Occupation: DISABLED  Tobacco Use   Smoking status: Former    Packs/day: 1.00    Years: 15.00    Total pack years: 15.00    Types: Cigarettes    Quit date: 08/20/1989    Years since quitting: 32.9    Passive exposure: Past   Smokeless tobacco:  Never  Vaping Use   Vaping Use: Never used  Substance and Sexual Activity   Alcohol use: Yes    Alcohol/week: 3.0 standard drinks of alcohol    Types: 3 Glasses of wine per week    Comment: occasionally, not on a weekly basis   Drug use: Not Currently    Frequency: 1.0 times per week    Comment: CBD gummy occ; marijuana occasionally   Sexual activity: Not Currently  Other Topics Concern   Not on file  Social History Narrative   Lives alone   Caffeine IFO:YDXA   Retired from [employer]   2 children -- 5 grandbabies - 1 great-grandbaby    Social Determinants of Health   Financial Resource Strain: Low Risk  (12/08/2020)   Overall Financial Resource Strain (CARDIA)    Difficulty of Paying Living Expenses: Not hard at all  Food Insecurity: No Food Insecurity (12/08/2020)   Hunger Vital Sign    Worried About Running Out of Food in the Last Year: Never true    Ran Out of Food in the Last Year: Never true  Transportation Needs: No Transportation Needs (12/08/2020)   PRAPARE - Hydrologist (Medical): No    Lack of Transportation (Non-Medical): No  Physical Activity: Inactive (12/08/2020)   Exercise Vital Sign    Days of Exercise per Week: 0 days    Minutes of Exercise per Session: 0 min  Stress: No Stress Concern Present (12/08/2020)   [place]    Feeling of Stress : Not at all  Social Connections: Moderately Isolated (12/08/2020)   Social Connection and Isolation Panel [NHANES]    Frequency of Communication with Friends and Family: More than three times a week    Frequency of Social Gatherings with Friends and Family: More than three times a week    Attends Religious Services: 1 to 4 times per year    Active Member of [employer] or Organizations: No    Attends Archivist Meetings: Never    Marital Status: Divorced  Intimate Partner Violence: Not At Risk (12/08/2020)   Humiliation,  Afraid, Rape, and Kick questionnaire    Fear of Current or Ex-Partner: No    Emotionally Abused: No    Physically Abused: No    Sexually Abused: No    DIAGNOSTIC DATA (LABS, IMAGING, TESTING) - I reviewed patient records, labs, notes, testing and imaging myself where available.  Lab Results  Component Value Date   WBC 3.9 (L) 07/17/2022   HGB 14.9 07/17/2022   HCT 45.0 07/17/2022   MCV 93.2 07/17/2022   PLT 166.0 07/17/2022      Component Value Date/Time   NA 140 07/17/2022 1505   NA 138 03/23/2022 1553   NA 141 12/01/2014 0847   K 3.9 07/17/2022 1505   K 3.8 12/01/2014 0847   CL 105 07/17/2022 1505   CO2 29 07/17/2022 1505   CO2 26 12/01/2014 0847   GLUCOSE 85 07/17/2022 1505   GLUCOSE 175 (H) 12/01/2014 0847   BUN 13 07/17/2022 1505   BUN 10 03/23/2022 1553   BUN 10.6 12/01/2014 0847   CREATININE 0.81 07/17/2022 1505   CREATININE 0.82 02/27/2017 1518   CREATININE 0.8 12/01/2014 0847   CALCIUM 9.9 07/17/2022 1505   CALCIUM 9.8 12/01/2014 0847   PROT 7.4 07/17/2022 1505   PROT 6.6 10/19/2021 0911   PROT 7.5 12/01/2014 0847   ALBUMIN 4.3 07/17/2022 1505   ALBUMIN 3.9 10/19/2021 0911   ALBUMIN 3.9 12/01/2014 0847   AST 40 (H) 07/17/2022 1505   AST 29 12/01/2014 0847   ALT 47 (H) 07/17/2022 1505   ALT 33 12/01/2014 0847   ALKPHOS 84 07/17/2022 1505   ALKPHOS 127 12/01/2014 0847   BILITOT 0.5 07/17/2022 1505   BILITOT 0.3 10/19/2021 0911   BILITOT 0.42 12/01/2014 0847   GFRNONAA >60 06/07/2021 0359   GFRNONAA 74 02/27/2017 1518   GFRAA 82 10/01/2019 1408   GFRAA 85 02/27/2017 1518   Lab Results  Component Value Date   CHOL 178 10/19/2021   HDL 51 10/19/2021   LDLCALC 109 (H) 10/19/2021   LDLDIRECT 141.8 07/04/2007   TRIG 100 10/19/2021   CHOLHDL 3.5 10/19/2021   Lab Results  Component Value Date   HGBA1C 5.3 06/27/2022   Lab Results  Component Value Date   VITAMINB12 483 10/01/2019   Lab Results  Component Value Date   TSH 1.55 04/10/2021     Butler Denmark, AGNP-C, DNP 08/10/2022, 10:10 AM [facility] [address] [place], [place] [address] [phone]

## 2022-08-10 NOTE — Patient Instructions (Signed)
Meds ordered this encounter  Medications   cloBAZam (ONFI) 10 MG tablet    Sig: Take 1 tablet (10 mg total) by mouth 2 (two) times daily.    Dispense:  60 tablet    Refill:  5     Start with Onfi '10mg'$  every night x1-2 week, Then '10mg'$  twice a day

## 2022-08-14 ENCOUNTER — Telehealth: Payer: Self-pay | Admitting: Neurology

## 2022-08-14 NOTE — Telephone Encounter (Signed)
medicare/[employer] NPR sent to [facility] [phone] 

## 2022-08-17 ENCOUNTER — Ambulatory Visit: Payer: Medicare Other | Admitting: Physical Therapy

## 2022-08-17 DIAGNOSIS — M545 Low back pain, unspecified: Secondary | ICD-10-CM

## 2022-08-17 DIAGNOSIS — M6281 Muscle weakness (generalized): Secondary | ICD-10-CM

## 2022-08-17 DIAGNOSIS — M25552 Pain in left hip: Secondary | ICD-10-CM

## 2022-08-17 NOTE — Patient Instructions (Signed)
Aquatic Therapy at [facility]-  What to Expect!  Where:   [facility] [address] [facility] [place], [place] [address] Rehab phone [phone]  NOTE:  You will receive an automated phone message reminding you of your appt and it will say the appointment is at the [address] [facility] [address] [facility] clinic.          How to Prepare: Please make sure you drink 8 ounces of water about one hour prior to your pool session A caregiver may attend if needed with the patient to help assist as needed. A caregiver can sit in the pool room on chair. Please arrive IN YOUR SUIT and 15 minutes prior to your appointment - this helps to avoid delays in starting your session. Please make sure to attend to any toileting needs prior to entering the pool Locker rooms for changing are provided.   There is direct access to the pool deck form the locker room.  You can lock your belongings in a locker with lock provided. Once on the pool deck your therapist will ask if you have signed the Patient  Consent and Assignment of Benefits form before beginning treatment Your therapist may take your blood pressure prior to, during and after your session if indicated We usually try and create a home exercise program based on activities we do in the pool.  Please be thinking about who might be able to assist you in the pool should you need to participate in an aquatic home exercise program at the time of discharge if you need assistance.  Some patients do not want to or do not have the ability to participate in an aquatic home program - this is not a barrier in any way to you participating in aquatic therapy as part of your current therapy plan! After Discharge from PT, you can continue using home program at  the [facility]/, there is a drop-in fee for $5 ($45 a month)or for 60 years  or older $4.00 ($40 a month for seniors ) or any local [employer] pool.  Memberships for purchase are  available for gym/pool at [facility]  IT IS VERY IMPORTANT THAT YOUR LAST VISIT BE IN THE CLINIC AT [address] AFTER YOUR LAST AQUATIC VISIT.  PLEASE MAKE SURE THAT YOU HAVE A [address]  APPOINTMENT SCHEDULED.   About the pool: Pool is located approximately 500 FT from the entrance of the building.  Please bring a support person if you need assistance traveling this      distance.   Your therapist will assist you in entering the water; there are two ways to           enter: stairs with railings, and a mechanical lift. Your therapist will determine the most appropriate way for you.  Water temperature is usually between 88-90 degrees  There may be up to 2 other swimmers in the pool at the same time  The pool deck is tile, please wear shoes with good traction if you prefer not to be barefoot.    Contact Info:  For appointment scheduling and cancellations:         Please call the [facility]  PH:[phone]              Aquatic Therapy  Outpatient Rehabilitation @ [facility]       All sessions are 45 minutes                                                    

## 2022-08-17 NOTE — Therapy (Signed)
OUTPATIENT PHYSICAL THERAPY TREATMENT NOTE   Patient Name: [name] MRN: [id number] DOB:1951/04/05, 71 y.o., female Today's Date: 08/17/2022  PCP: Camillia Herter, NP   REFERRING PROVIDER: Dorna Mai, MD  END OF SESSION:   PT End of Session - 08/17/22 1103     Visit Number 2    Number of Visits 13    Date for PT Re-Evaluation 09/21/22    Authorization Type MEDICARE PART A AND B    Progress Note Due on Visit 10    PT Start Time 1101    PT Stop Time 8466    PT Time Calculation (min) 44 min             Past Medical History:  Diagnosis Date   AICD (automatic cardioverter/defibrillator) present    Anxiety    Arthritis    Back pain    Breast cancer ([place]) 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   Bronchiectasis ([place])    Cerebral aneurysm, nonruptured    had a clip put in   CHF (congestive heart failure) (HCC)    Clostridium difficile infection    Depressive disorder, not elsewhere classified    Diverticulosis of colon (without mention of hemorrhage)    Esophageal reflux    Fatty liver    Fibromyalgia    Gastritis    GERD (gastroesophageal reflux disease)    GI bleed 2004   Glaucoma    Hiatal hernia    History of COVID-19 05/04/2021   Hyperlipidemia    Hypertension    Hypothyroidism    Internal hemorrhoids    Joint pain    Multifocal pneumonia 06/03/2021   Obstructive sleep apnea (adult) (pediatric)    Osteoarthritis    Ostium secundum type atrial septal defect    Other chronic nonalcoholic liver disease    Other pulmonary embolism and infarction    Palpitations    Paroxysmal ventricular tachycardia ([place])    Personal history of radiation therapy    PONV (postoperative nausea and vomiting)    Presence of permanent cardiac pacemaker    PUD (peptic ulcer disease)    Radiation 02/03/15-03/10/15   Right Breast   Sarcoid    per pt , not sure   Schatzki's ring    Shortness of breath    Sleep apnea    Stroke [facility]) 2013   tia/ pt feels it  was around 2008 0r 2009   Takotsubo syndrome    Tubular adenoma of colon    Unspecified transient cerebral ischemia    Unspecified vitamin D deficiency    Past Surgical History:  Procedure Laterality Date   ABI  2006   normal   BREAST EXCISIONAL BIOPSY     BREAST LUMPECTOMY Right 2016   BREAST LUMPECTOMY WITH RADIOACTIVE SEED LOCALIZATION Right 01/03/2015   Procedure: BREAST LUMPECTOMY WITH RADIOACTIVE SEED LOCALIZATION;  Surgeon: Autumn Messing III, MD;  Location: [facility];  Service: General;  Laterality: Right;   CARDIAC DEFIBRILLATOR PLACEMENT  2006; 2012   BSX single chamber ICD   carotid dopplers  2006   neg   CEREBRAL ANEURYSM REPAIR  02/1999   COLONOSCOPY     HAMMER TOE SURGERY  11/15/2020   3rd digit bilateral feet    hospitalization  2004   GI bleed, PUD, diverticulosis (EGD,colonscopy)   hospitalization     PE, NSVT, s/p defib   KNEE ARTHROSCOPY     bilateral   LEFT HEART CATHETERIZATION WITH CORONARY ANGIOGRAM N/A 02/22/2012  Procedure: LEFT HEART CATHETERIZATION WITH CORONARY ANGIOGRAM;  Surgeon: Burnell Blanks, MD;  Location: [facility] CATH LAB;  Service: Cardiovascular;  Laterality: N/A;   PARTIAL HYSTERECTOMY     Fibroids   TONGUE BIOPSY  12/12/2017   due to sore tongue and white patches/abnormal cells   TOTAL KNEE ARTHROPLASTY Left 02/13/2016   Procedure: TOTAL KNEE ARTHROPLASTY;  Surgeon: Vickey Huger, MD;  Location: [facility];  Service: Orthopedics;  Laterality: Left;   TOTAL KNEE ARTHROPLASTY Right 05/22/2021   Procedure: TOTAL KNEE ARTHROPLASTY;  Surgeon: Vickey Huger, MD;  Location: [facility];  Service: Orthopedics;  Laterality: Right;  with block   UPPER GASTROINTESTINAL ENDOSCOPY     Patient Active Problem List   Diagnosis Date Noted   Partial symptomatic epilepsy with complex partial seizures, not intractable, without status epilepticus ([place]) 08/10/2022   Low back pain 08/10/2022   Gait abnormality 05/04/2022   Muscle spasm 05/04/2022   Upper airway cough  syndrome 01/19/2022   Chronic left shoulder pain 11/13/2021   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   Localized swelling of right lower leg 08/29/2021   Tonsillar exudate 08/29/2021   Candida vaginitis 08/28/2021   Chronic pain syndrome 08/25/2021   Multifocal pneumonia 06/03/2021   Pre-operative respiratory examination 05/18/2021   Statin myopathy 04/19/2021   Pain in joint of right shoulder 02/10/2021   Arthritis of both acromioclavicular joints 01/02/2021   Trochanteric bursitis of right hip 10/03/2020   Tailbone injury 05/05/2020   UTI (urinary tract infection) 05/05/2020   Insomnia 05/05/2020   Leg cramps 02/16/2020   Dysuria 02/16/2020   Right hip impingement syndrome 02/01/2020   Right bicipital tenosynovitis 02/01/2020   Bronchiectasis without complication ([place]) 62/22/9798   Myofascial pain 12/21/2019   Estrogen deficiency 12/10/2019   Obesity (BMI 30-39.9) 12/10/2019   Bronchiectasis with (acute) exacerbation (HCC) 10/23/2019   Elevated LFTs 10/15/2019   Lower abdominal pain 09/01/2019   Neck pain 10/25/2017   Paresthesias in left hand 10/25/2017   Paresthesia of foot, bilateral 04/12/2017   Primary osteoarthritis of both hands 09/27/2016   Primary osteoarthritis of both knees 09/27/2016   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   Nasopharyngitis, 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/2016   S/P total knee replacement 02/13/2016   Chronic pain of both knees 12/06/2015   Globus pharyngeus 05/17/2015   Genetic testing 12/23/2014   Family history of breast cancer    Family history of colon cancer    Family history of pancreatic cancer    History of breast cancer 11/23/2014   Allergy to ACE inhibitors 09/27/2014   Prediabetes 06/23/2013   Muscle spasms of both lower extremities 01/15/2013   Hx of Clostridium difficile infection 10/10/2012   Dyspnea on exertion 04/02/2012   NICM (nonischemic cardiomyopathy) ([place]) 03/07/2012   Post-menopausal 01/11/2012   Atypical chest pain 11/07/2011    Obstructive sleep apnea 04/04/2010   V-tach ([place]) 01/03/2009   ICD  [employer]  Single chamber 01/03/2009   PFO (patent foramen ovale) 09/29/2008   Vitamin D deficiency 08/17/2008   Hypothyroidism 11/18/2006   Hyperlipidemia 11/18/2006   Depression with anxiety 11/18/2006   Essential hypertension 11/18/2006   Mitral valve prolapse 11/18/2006   Cerebral aneurysm 11/18/2006   Allergic rhinitis 11/18/2006   GERD 11/18/2006   Diverticulosis of colon 11/18/2006   Fatty liver 11/18/2006   Fibromyalgia 11/18/2006    REFERRING DIAG: M54.50 (ICD-10-CM) - Left-sided low back pain without sciatica, unspecified chronicity; M25.552 (ICD-10-CM) - Left hip pain  THERAPY DIAG:  Chronic bilateral low back pain without sciatica  Muscle weakness (generalized)  Pain in left hip  Rationale for Evaluation and Treatment Rehabilitation  PERTINENT HISTORY:  Automatic cardioverter/defibrillator; Bilat knee replacements; arthritis; fibromyalgia; depression, High BMI  PRECAUTIONS: None  SUBJECTIVE:                                                                                                                                                                                      SUBJECTIVE STATEMENT:  Back is 6/10, hip 7/10, Left shoulder 4-5/10. I would like to go somewhere and get in a hot tub. I think it would help me move better.    PAIN:  Are you having pain? Yes: NPRS scale: 6/10 Pain location: L lowback, gluteal, lat hip, groin, and ant thigh Pain description: ache, sharp occasionally, constant Aggravating factors: Walking, sleeping, in/out of car, bending over Relieving factors: Muscle relaxors, heat pad, tylenol On Eval pain range=6-8/10   OBJECTIVE: (objective measures completed at initial evaluation unless otherwise dated)   Pt reports xrays were taken with [facility] yesterday. He was able to recall there was no fracture, proper spacing for the joint   PATIENT SURVEYS:  FOTO:  Perceived function   38%, predicted   55%    SCREENING FOR RED FLAGS: Bowel or bladder incontinence: No Spinal tumors: No Compression fracture: No   COGNITION: Overall cognitive status: Within functional limits for tasks assessed                          SENSATION: WFL   MUSCLE LENGTH: Hamstrings: Right NT deg; Left NT deg Marcello Moores test: Right NT deg; Left NT deg   POSTURE: increased lumbar lordosis   PALPATION: TTP with increased muscle tightness to the L gluteal region   LUMBAR ROM:    AROM eval  Flexion Mod limited, low back pulling pain  Extension Mod limited, pain low back and L hip  Right lateral flexion Mod limited, pulling pain R hip  Left lateral flexion Mod limited, pain across low back  Right rotation Mod limited, pulling pain R hip and across low back  Left rotation Mod limited, pain across low back   (Blank rows = not tested)   LOWER EXTREMITY ROM:     L decreased in comparison to R  due to pain Passive  Right eval Left eval  Hip flexion   P  Hip extension   P  Hip abduction   P  Hip adduction      Hip internal rotation   P  Hip external rotation   P  Knee flexion      Knee extension      Ankle dorsiflexion  Ankle plantarflexion      Ankle inversion      Ankle eversion      P=pain  (Blank rows = not tested)   LOWER EXTREMITY MMT:   L LE strength is deceased in comparison to R due to Pain MMT Right eval Left eval  Hip flexion   P  Hip extension   P  Hip abduction   P  Hip adduction   P  Hip internal rotation   P  Hip external rotation   P  Knee flexion      Knee extension      Ankle dorsiflexion      Ankle plantarflexion      Ankle inversion      Ankle eversion      P=pain  (Blank rows = not tested)   LUMBAR SPECIAL TESTS:  Straight leg raise test: Negative, Slump test: Negative, and SI Compression/distraction test: Negative          FADER L hip positive   FUNCTIONAL TESTS:  5 times sit to stand: 08/17/22: 40.8 sec (low mat,  with UE) 2 minute walk test: 08/17/22: 185 Feet    GAIT: Distance walked: 137f Assistive device utilized: Single point cane Level of assistance: Complete Independence Comments: decreased pace   TODAY'S TREATMENT: [facility] Adult PT Treatment:                                                DATE: 08/17/22 Therapeutic Exercise: Review of HEP STS from elevated Mat x 5 - needs UE- slow and guarded Seated Lumbar flexion with hands on thights Seated H/S stretch EOM x 2 - UE on thigh for support Seated marching Seated Trunk AROM rotation and side bending.   Therapeutic Activity: 2 MWT 5 x STS  Sit to supine transfers                                                                                                                              [facility]                                                [facility]: [facility] Therapeutic Exercise: Developed, instructed in, and pt completed therex as noted in HEP Self Care: Heating pad for pain management   PATIENT EDUCATION:  Education details: Eval findings, POC, HEP, self care Person educated: Patient, Parent, Spouse, and Child(ren) Education method: Explanation, Demonstration, Tactile cues, Verbal cues, and Handouts Education comprehension: verbalized understanding, returned demonstration, verbal cues required, and tactile cues required   HOME EXERCISE PROGRAM: Access Code: [email or web address] URL: [email or web address] Date: 08/03/2022 Prepared by: AGar Ponto  Exercises - Supine Lower Trunk Rotation  -  1-2 x daily - 7 x weekly - 1 sets - 5 reps - 5 hold - Supine Piriformis Stretch with Foot on Ground  - 1-2 x daily - 7 x weekly - 3 reps - 15 hold - Hooklying Single Knee to Chest  - 1-2 x daily - 7 x weekly - 1 sets - 3 reps - 15 hold - Supine Bridge  - 1-2 x daily - 7 x weekly - 1 sets - 10 reps - 3 hold - Hooklying Clamshell with Resistance  - 1 x daily - 7 x weekly - 1 sets - 10 reps - 3 hold   ASSESSMENT:    CLINICAL IMPRESSION: Patient is a 71 y.o. female who was seen today for physical therapy evaluation and treatment for M54.50 (ICD-10-CM) - Left-sided low back pain without sciatica, unspecified chronicity; M25.552 (ICD-10-CM) - Left hip pain.She reports compliance with HEP and feels the exercises are helpful. Reviewed HEP and captured functional test baselines. Decreased 5 x STS and 2 MWT compared to norms for age. Continued with general trunk and hip mobility, showing patient options for seated mobility. Also encouraged a walking program inside her home. Pt also schedule for aquatic therapy to start in [place] January.  Pt wil benefit from skilled PT to address impairments for improved back and L hip function with less pain.   OBJECTIVE IMPAIRMENTS: decreased activity tolerance, decreased balance, difficulty walking, decreased ROM, decreased strength, increased muscle spasms, obesity, and pain.    ACTIVITY LIMITATIONS: carrying, lifting, bending, sitting, standing, squatting, sleeping, stairs, and locomotion level   PARTICIPATION LIMITATIONS: meal prep, cleaning, laundry, shopping, and community activity   PERSONAL FACTORS: Fitness, Past/current experiences, Time since onset of injury/illness/exacerbation, and 3+ comorbidities:    Mod limited, pulling pain R hipare also affecting patient's functional outcome.    REHAB POTENTIAL: Good   CLINICAL DECISION MAKING: Evolving/moderate complexity   EVALUATION COMPLEXITY: Moderate     GOALS:   SHORT TERM GOALS: Target date: 08/24/22   Pt will be Ind in an initial HEP  Baseline: initiated Goal status: INITIAL   2.  Pt will voice understanding of measures to assist in pain reduction Baseline: initiated Goal status: INITIAL   LONG TERM GOALS: Target date: 09/21/22   Pt will be Ind in a final HEP to maintain achieved LOF Baseline: initiated Goal status: INITIAL   2.  Pt's trunk ROM will improve to minimal or less for improved function and as  demonstration of decreased pain Baseline: see flow sheets Goal status: INITIAL   3.  Pt will report a decreased in pain to 3/10 or less with daily activities Baseline: 6-8/10 Goal status: INITIAL   4.  Pt L hip will demonstrate ROM and strength equal to that of the R hip for improved function Baseline:  Goal status: INITIAL   5. Improve 5xSTS by MCID of 5" and 2MWT by MCID of 50f as indication of improved functional mobility  Baseline: TBA Goal status: INITIAL   6.  Pt's FOTO score will improved to the predicted value of 55% as indication of improved function  Baseline: 38% Goal status: INITIAL   PLAN:   PT FREQUENCY: 2x/week   PT DURATION: 6 weeks   PLANNED INTERVENTIONS: Therapeutic exercises, Therapeutic activity, Neuromuscular re-education, Balance training, Gait training, Patient/Family education, Self Care, Joint mobilization, Stair training, Aquatic Therapy, Dry Needling, Electrical stimulation, Spinal manipulation, Spinal mobilization, Cryotherapy, Moist heat, Taping, Traction, Ultrasound, Ionotophoresis '4mg'$ /ml Dexamethasone, Manual therapy, and Re-evaluation.   PLAN FOR NEXT SESSION:  Review FOTO; ; assess response to HEP; progress therex as indicated; use of modalities, manual therapy; and TPDN as indicated. Pt is scheduled to start Aquatics at [facility] with Angus Seller [facility]-she was given info sheet.   Ask patient about hot tub -she can use [facility] as long as they she either used one before and know that it won't negatively affect her or is cleared from their MD to use one.   Hessie Diener, PTA 08/17/22 12:28 PM Phone: [phone] Fax: [phone]

## 2022-08-21 ENCOUNTER — Encounter: Payer: Self-pay | Admitting: Neurology

## 2022-08-22 ENCOUNTER — Other Ambulatory Visit: Payer: Self-pay | Admitting: Interventional Cardiology

## 2022-08-22 ENCOUNTER — Telehealth: Payer: Self-pay

## 2022-08-22 ENCOUNTER — Other Ambulatory Visit: Payer: Medicare Other

## 2022-08-22 ENCOUNTER — Ambulatory Visit: Payer: Medicare Other | Attending: Family Medicine

## 2022-08-22 DIAGNOSIS — M545 Low back pain, unspecified: Secondary | ICD-10-CM | POA: Insufficient documentation

## 2022-08-22 DIAGNOSIS — R3915 Urgency of urination: Secondary | ICD-10-CM | POA: Diagnosis present

## 2022-08-22 DIAGNOSIS — M6281 Muscle weakness (generalized): Secondary | ICD-10-CM | POA: Diagnosis present

## 2022-08-22 DIAGNOSIS — R252 Cramp and spasm: Secondary | ICD-10-CM

## 2022-08-22 DIAGNOSIS — G8929 Other chronic pain: Secondary | ICD-10-CM | POA: Diagnosis present

## 2022-08-22 DIAGNOSIS — R278 Other lack of coordination: Secondary | ICD-10-CM | POA: Diagnosis present

## 2022-08-22 DIAGNOSIS — M25552 Pain in left hip: Secondary | ICD-10-CM | POA: Insufficient documentation

## 2022-08-22 NOTE — Therapy (Signed)
OUTPATIENT PHYSICAL THERAPY TREATMENT NOTE   Patient Name: Aideliz Garmany MRN: [id number] DOB:1951/08/17, 72 y.o., female Today's Date: 08/22/2022  PCP: Camillia Herter, NP   REFERRING PROVIDER: Dorna Mai, MD  END OF SESSION:   PT End of Session - 08/22/22 1149     Visit Number 3    Number of Visits 13    Date for PT Re-Evaluation 09/21/22    Authorization Type MEDICARE PART A AND B    Progress Note Due on Visit 10    PT Start Time 1150    PT Stop Time 1237    PT Time Calculation (min) 47 min    Activity Tolerance Patient tolerated treatment well    Behavior During Therapy WFL for tasks assessed/performed              Past Medical History:  Diagnosis Date   AICD (automatic cardioverter/defibrillator) present    Anxiety    Arthritis    Back pain    Breast cancer ([place]) 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   Bronchiectasis ([place])    Cerebral aneurysm, nonruptured    had a clip put in   CHF (congestive heart failure) (HCC)    Clostridium difficile infection    Depressive disorder, not elsewhere classified    Diverticulosis of colon (without mention of hemorrhage)    Esophageal reflux    Fatty liver    Fibromyalgia    Gastritis    GERD (gastroesophageal reflux disease)    GI bleed 2004   Glaucoma    Hiatal hernia    History of COVID-19 05/04/2021   Hyperlipidemia    Hypertension    Hypothyroidism    Internal hemorrhoids    Joint pain    Multifocal pneumonia 06/03/2021   Obstructive sleep apnea (adult) (pediatric)    Osteoarthritis    Ostium secundum type atrial septal defect    Other chronic nonalcoholic liver disease    Other pulmonary embolism and infarction    Palpitations    Paroxysmal ventricular tachycardia ([place])    Personal history of radiation therapy    PONV (postoperative nausea and vomiting)    Presence of permanent cardiac pacemaker    PUD (peptic ulcer disease)    Radiation 02/03/15-03/10/15   Right Breast   Sarcoid     per pt , not sure   Schatzki's ring    Shortness of breath    Sleep apnea    Stroke [facility]) 2013   tia/ pt feels it was around 2008 0r 2009   Takotsubo syndrome    Tubular adenoma of colon    Unspecified transient cerebral ischemia    Unspecified vitamin D deficiency    Past Surgical History:  Procedure Laterality Date   ABI  2006   normal   BREAST EXCISIONAL BIOPSY     BREAST LUMPECTOMY Right 2016   BREAST LUMPECTOMY WITH RADIOACTIVE SEED LOCALIZATION Right 01/03/2015   Procedure: BREAST LUMPECTOMY WITH RADIOACTIVE SEED LOCALIZATION;  Surgeon: Autumn Messing III, MD;  Location: [name];  Service: General;  Laterality: Right;   CARDIAC DEFIBRILLATOR PLACEMENT  2006; 2012   BSX single chamber ICD   carotid dopplers  2006   neg   CEREBRAL ANEURYSM REPAIR  02/1999   COLONOSCOPY     HAMMER TOE SURGERY  11/15/2020   3rd digit bilateral feet    hospitalization  2004   GI bleed, PUD, diverticulosis (EGD,colonscopy)   hospitalization     PE, NSVT, s/p defib  KNEE ARTHROSCOPY     bilateral   LEFT HEART CATHETERIZATION WITH CORONARY ANGIOGRAM N/A 02/22/2012   Procedure: LEFT HEART CATHETERIZATION WITH CORONARY ANGIOGRAM;  Surgeon: Burnell Blanks, MD;  Location: [facility] CATH LAB;  Service: Cardiovascular;  Laterality: N/A;   PARTIAL HYSTERECTOMY     Fibroids   TONGUE BIOPSY  12/12/2017   due to sore tongue and white patches/abnormal cells   TOTAL KNEE ARTHROPLASTY Left 02/13/2016   Procedure: TOTAL KNEE ARTHROPLASTY;  Surgeon: Vickey Huger, MD;  Location: [place];  Service: Orthopedics;  Laterality: Left;   TOTAL KNEE ARTHROPLASTY Right 05/22/2021   Procedure: TOTAL KNEE ARTHROPLASTY;  Surgeon: Vickey Huger, MD;  Location: [facility];  Service: Orthopedics;  Laterality: Right;  with block   UPPER GASTROINTESTINAL ENDOSCOPY     Patient Active Problem List   Diagnosis Date Noted   Partial symptomatic epilepsy with complex partial seizures, not intractable, without status epilepticus ([place])  08/10/2022   Low back pain 08/10/2022   Gait abnormality 05/04/2022   Muscle spasm 05/04/2022   Upper airway cough syndrome 01/19/2022   Chronic left shoulder pain 11/13/2021   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   Localized swelling of right lower leg 08/29/2021   Tonsillar exudate 08/29/2021   Candida vaginitis 08/28/2021   Chronic pain syndrome 08/25/2021   Multifocal pneumonia 06/03/2021   Pre-operative respiratory examination 05/18/2021   Statin myopathy 04/19/2021   Pain in joint of right shoulder 02/10/2021   Arthritis of both acromioclavicular joints 01/02/2021   Trochanteric bursitis of right hip 10/03/2020   Tailbone injury 05/05/2020   UTI (urinary tract infection) 05/05/2020   Insomnia 05/05/2020   Leg cramps 02/16/2020   Dysuria 02/16/2020   Right hip impingement syndrome 02/01/2020   Right bicipital tenosynovitis 02/01/2020   Bronchiectasis without complication ([place]) 89/38/1017   Myofascial pain 12/21/2019   Estrogen deficiency 12/10/2019   Obesity (BMI 30-39.9) 12/10/2019   Bronchiectasis with (acute) exacerbation ([place]) 10/23/2019   Elevated LFTs 10/15/2019   Lower abdominal pain 09/01/2019   Neck pain 10/25/2017   Paresthesias in left hand 10/25/2017   Paresthesia of foot, bilateral 04/12/2017   Primary osteoarthritis of both hands 09/27/2016   Primary osteoarthritis of both knees 09/27/2016   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   Nasopharyngitis, 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/2016   S/P total knee replacement 02/13/2016   Chronic pain of both knees 12/06/2015   Globus pharyngeus 05/17/2015   Genetic testing 12/23/2014   Family history of breast cancer    Family history of colon cancer    Family history of pancreatic cancer    History of breast cancer 11/23/2014   Allergy to ACE inhibitors 09/27/2014   Prediabetes 06/23/2013   Muscle spasms of both lower extremities 01/15/2013   Hx of Clostridium difficile infection 10/10/2012   Dyspnea on exertion 04/02/2012    NICM (nonischemic cardiomyopathy) ([place]) 03/07/2012   Post-menopausal 01/11/2012   Atypical chest pain 11/07/2011   Obstructive sleep apnea 04/04/2010   V-tach ([place]) 01/03/2009   ICD  [employer]  Single chamber 01/03/2009   PFO (patent foramen ovale) 09/29/2008   Vitamin D deficiency 08/17/2008   Hypothyroidism 11/18/2006   Hyperlipidemia 11/18/2006   Depression with anxiety 11/18/2006   Essential hypertension 11/18/2006   Mitral valve prolapse 11/18/2006   Cerebral aneurysm 11/18/2006   Allergic rhinitis 11/18/2006   GERD 11/18/2006   Diverticulosis of colon 11/18/2006   Fatty liver 11/18/2006   Fibromyalgia 11/18/2006    REFERRING DIAG: M54.50 (ICD-10-CM) - Left-sided low back pain without sciatica,  unspecified chronicity; M25.552 (ICD-10-CM) - Left hip pain  THERAPY DIAG:  Chronic bilateral low back pain without sciatica  Muscle weakness (generalized)  Pain in left hip  Cramp and spasm  Rationale for Evaluation and Treatment Rehabilitation  PERTINENT HISTORY:  Automatic cardioverter/defibrillator; Bilat knee replacements; arthritis; fibromyalgia; depression, High BMI  PRECAUTIONS: None  SUBJECTIVE:                                                                                                                                                                                      SUBJECTIVE STATEMENT:  Back is /10, hip 5/10, Left shoulder 4-5/10. Pt reports she is completing her HEP.   PAIN:  Are you having pain? Yes: NPRS scale: 6/10 Pain location: L lowback, gluteal, lat hip, groin, and ant thigh Pain description: ache, sharp occasionally, constant Aggravating factors: Walking, sleeping, in/out of car, bending over Relieving factors: Muscle relaxors, heat pad, tylenol On Eval pain range=6-8/10   OBJECTIVE: (objective measures completed at initial evaluation unless otherwise dated)   Pt reports xrays were taken with [facility] yesterday. He was able to recall  there was no fracture, proper spacing for the joint   PATIENT SURVEYS:  FOTO: Perceived function   38%, predicted   55%    SCREENING FOR RED FLAGS: Bowel or bladder incontinence: No Spinal tumors: No Compression fracture: No   COGNITION: Overall cognitive status: Within functional limits for tasks assessed                          SENSATION: WFL   MUSCLE LENGTH: Hamstrings: Right NT deg; Left NT deg Marcello Moores test: Right NT deg; Left NT deg   POSTURE: increased lumbar lordosis   PALPATION: TTP with increased muscle tightness to the L gluteal region   LUMBAR ROM:    AROM eval  Flexion Mod limited, low back pulling pain  Extension Mod limited, pain low back and L hip  Right lateral flexion Mod limited, pulling pain R hip  Left lateral flexion Mod limited, pain across low back  Right rotation Mod limited, pulling pain R hip and across low back  Left rotation Mod limited, pain across low back   (Blank rows = not tested)   LOWER EXTREMITY ROM:     L decreased in comparison to R  due to pain Passive  Right eval Left eval  Hip flexion   P  Hip extension   P  Hip abduction   P  Hip adduction      Hip internal rotation   P  Hip external rotation   P  Knee flexion  Knee extension      Ankle dorsiflexion      Ankle plantarflexion      Ankle inversion      Ankle eversion      P=pain  (Blank rows = not tested)   LOWER EXTREMITY MMT:   L LE strength is deceased in comparison to R due to Pain MMT Right eval Left eval  Hip flexion   P  Hip extension   P  Hip abduction   P  Hip adduction   P  Hip internal rotation   P  Hip external rotation   P  Knee flexion      Knee extension      Ankle dorsiflexion      Ankle plantarflexion      Ankle inversion      Ankle eversion      P=pain  (Blank rows = not tested)   LUMBAR SPECIAL TESTS:  Straight leg raise test: Negative, Slump test: Negative, and SI Compression/distraction test: Negative          FADER L hip  positive   FUNCTIONAL TESTS:  5 times sit to stand: 08/17/22: 40.8 sec (low mat, with UE) 2 minute walk test: 08/17/22: 185 Feet    GAIT: Distance walked: 143f Assistive device utilized: Single point cane Level of assistance: Complete Independence Comments: decreased pace   TODAY'S TREATMENT: [facility] Adult PT Treatment:                                                DATE: 08/22/22 Therapeutic Exercise: Supine Lower Trunk Rotation  - 1-2 x daily - 7 x weekly - 1 sets - 5 reps - 5 hold Supine Piriformis Stretch 2 reps - 15 hold Hooklying Single Knee to Chest  2 reps - 15 hold Supine hip add sets c ball 15 reps- 3 hold Supine Bridge 15 reps - 3 hold Hooklying Clamshell BluTB 15 reps Manual Therapy: STM/DTM to the L piriformis and gluteal muscles Skilled palpation for TrPs and taut muscle bands for L piriformis and gluteal muscles Self Care: Sleeping positions with support to minimize L hip pressure and strain Trigger Point Dry Needling Treatment: Pre-treatment instruction: Patient instructed on dry needling rationale, procedures, and possible side effects including pain during treatment (achy,cramping feeling), bruising, drop of blood, lightheadedness, nausea, sweating. Patient Consent Given: Yes Education handout provided: Yes Muscles treated: L prirformis, glut minimus, medius and maximus  Needle size and number: .[id number] 1 Electrical stimulation performed: No Parameters: N/A Treatment response/outcome: Twitch response elicited Post-treatment instructions: Patient instructed to expect possible mild to moderate muscle soreness later today and/or tomorrow. Patient instructed in methods to reduce muscle soreness and to continue prescribed HEP. If patient was dry needled over the lung field, patient was instructed on signs and symptoms of pneumothorax and, however unlikely, to see immediate medical attention should they occur. Patient was also educated on signs and symptoms of infection  and to seek medical attention should they occur. Patient verbalized understanding of these instructions and education.   [facility]                                                [facility]: 08[facility] Therapeutic Exercise:  Review of HEP STS from elevated Mat x 5 - needs UE- slow and guarded Seated Lumbar flexion with hands on thights Seated H/S stretch EOM x 2 - UE on thigh for support Seated marching Seated Trunk AROM rotation and side bending.   Therapeutic Activity: 2 MWT 5 x STS  Sit to supine transfers                                                                                                                           [facility] Adult PT Treatment:                                                DATE: 08/03/22 Therapeutic Exercise: Developed, instructed in, and pt completed therex as noted in HEP Self Care: Heating pad for pain management   PATIENT EDUCATION:  Education details: Eval findings, POC, HEP, self care Person educated: Patient, Parent, Spouse, and Child(ren) Education method: Explanation, Demonstration, Tactile cues, Verbal cues, and Handouts Education comprehension: verbalized understanding, returned demonstration, verbal cues required, and tactile cues required   HOME EXERCISE PROGRAM: Access Code: [email or web address] URL: [email or web address] Date: 08/22/2022 Prepared by: Gar Ponto  Exercises - Supine Lower Trunk Rotation  - 1-2 x daily - 7 x weekly - 1 sets - 5 reps - 5 hold - Supine Piriformis Stretch with Foot on Ground  - 1-2 x daily - 7 x weekly - 3 reps - 15 hold - Hooklying Single Knee to Chest  - 1-2 x daily - 7 x weekly - 1 sets - 3 reps - 15 hold - Supine Bridge  - 1-2 x daily - 7 x weekly - 1 sets - 10 reps - 3 hold - Hooklying Clamshell with Resistance  - 1 x daily - 7 x weekly - 1 sets - 10 reps - 3 hold - Supine Hip Adduction Isometric with Ball  - 1 x daily - 7 x weekly - 1 sets - 10 reps - 3 hold   ASSESSMENT:   CLINICAL  IMPRESSION: Per pt indication and palpation, pt is most symptomatic of L greater trochanter and gluteal pain. PT was completed for STM/DTM as noted above f/b TPDN to the L piriformis and gluteal muscles with twitch responses elicited. Pt then completed L hip ROM/flexibility and muscle activation strengthening therex of the L hip muscles. Pt tolerated PT today without adverse effects. Will assess pt's response to the TPDN the next PT session.   OBJECTIVE IMPAIRMENTS: decreased activity tolerance, decreased balance, difficulty walking, decreased ROM, decreased strength, increased muscle spasms, obesity, and pain.    ACTIVITY LIMITATIONS: carrying, lifting, bending, sitting, standing, squatting, sleeping, stairs, and locomotion level   PARTICIPATION LIMITATIONS: meal prep, cleaning, laundry, shopping, and community activity   PERSONAL FACTORS: Fitness, Past/current experiences, Time since onset of injury/illness/exacerbation, and 3+ comorbidities:    Mod limited,  pulling pain R hipare also affecting patient's functional outcome.    REHAB POTENTIAL: Good   CLINICAL DECISION MAKING: Evolving/moderate complexity   EVALUATION COMPLEXITY: Moderate     GOALS:   SHORT TERM GOALS: Target date: 08/24/22   Pt will be Ind in an initial HEP  Baseline: initiated Status: proper completion Goal status: Ongoing   2.  Pt will voice understanding of measures to assist in pain reduction Baseline: initiated Status: HEP and sleeping positions with support Goal status: Ongoin   LONG TERM GOALS: Target date: 09/21/22   Pt will be Ind in a final HEP to maintain achieved LOF Baseline: initiated Goal status: INITIAL   2.  Pt's trunk ROM will improve to minimal or less for improved function and as demonstration of decreased pain Baseline: see flow sheets Goal status: INITIAL   3.  Pt will report a decreased in pain to 3/10 or less with daily activities Baseline: 6-8/10 Goal status: INITIAL   4.  Pt L hip  will demonstrate ROM and strength equal to that of the R hip for improved function Baseline:  Goal status: INITIAL   5. Improve 5xSTS by MCID of 5" and 2MWT by MCID of 36f as indication of improved functional mobility  Baseline: TBA Goal status: INITIAL   6.  Pt's FOTO score will improved to the predicted value of 55% as indication of improved function  Baseline: 38% Goal status: INITIAL   PLAN:   PT FREQUENCY: 2x/week   PT DURATION: 6 weeks   PLANNED INTERVENTIONS: Therapeutic exercises, Therapeutic activity, Neuromuscular re-education, Balance training, Gait training, Patient/Family education, Self Care, Joint mobilization, Stair training, Aquatic Therapy, Dry Needling, Electrical stimulation, Spinal manipulation, Spinal mobilization, Cryotherapy, Moist heat, Taping, Traction, Ultrasound, Ionotophoresis '4mg'$ /ml Dexamethasone, Manual therapy, and Re-evaluation.   PLAN FOR NEXT SESSION: Review FOTO; ; assess response to HEP; progress therex as indicated; use of modalities, manual therapy; and TPDN as indicated. [facility]she was given info sheet.   Ask patient about hot tub -she can use draw bridge hot tub as long as they she either used one before and know that it won't negatively affect her or is cleared from their MD to use one.   Zoei Amison MS, PT 08/22/22 3:13 PM

## 2022-08-22 NOTE — Telephone Encounter (Signed)
PA for clobazam sent via cmm. (Key: [id number])  Your information has been sent to [facility].

## 2022-08-22 NOTE — Telephone Encounter (Signed)
Your information has been submitted to [employer]. [employer] will review the request and will issue a decision, typically within 3-7 days from your submission. You can check the updated outcome later by reopening this request.  If [employer] has not responded in 3-7 days or if you have any questions about your ePA request, please contact [employer] at [phone]. If you think there may be a problem with your PA request, use our live chat feature at the bottom right.  For [place] requests, please call [phone]. 

## 2022-08-22 NOTE — Patient Instructions (Signed)
Trigger Point Dry Needling  What is Trigger Point Dry Needling (DN)? DN is a physical therapy technique used to treat muscle pain and dysfunction. Specifically, DN helps deactivate muscle trigger points (muscle knots).  A thin filiform needle is used to penetrate the skin and stimulate the underlying trigger point. The goal is for a local twitch response (LTR) to occur and for the trigger point to relax. No medication of any kind is injected during the procedure.   What Does Trigger Point Dry Needling Feel Like?  The procedure feels different for each individual patient. Some patients report that they do not actually feel the needle enter the skin and overall the process is not painful. Very mild bleeding may occur. However, many patients feel a deep cramping in the muscle in which the needle was inserted. This is the local twitch response.   How Will I feel after the treatment? Soreness is normal, and the onset of soreness may not occur for a few hours. Typically this soreness does not last longer than two days.  Bruising is uncommon, however; ice can be used to decrease any possible bruising.  In rare cases feeling tired or nauseous after the treatment is normal. In addition, your symptoms may get worse before they get better, this period will typically not last longer than 24 hours.   What Can I do After My Treatment? Increase your hydration by drinking more water for the next 24 hours. You may place ice or heat on the areas treated that have become sore, however, do not use heat on inflamed or bruised areas. Heat often brings more relief post needling. You can continue your regular activities, but vigorous activity is not recommended initially after the treatment for 24 hours. DN is best combined with other physical therapy such as strengthening, stretching, and other therapies.   

## 2022-08-22 NOTE — Telephone Encounter (Signed)
(  Key: [id number])  Your information has been submitted to [facility]. [employer] will review the request and will issue a decision, typically within 3-7 days from your submission. You can check the updated outcome later by reopening this request.  If [employer] has not responded in 3-7 days or if you have any questions about your ePA request, please contact [employer] at [phone]. If you think there may be a problem with your PA request, use our live chat feature at the bottom right.

## 2022-08-23 ENCOUNTER — Ambulatory Visit (INDEPENDENT_AMBULATORY_CARE_PROVIDER_SITE_OTHER): Payer: Medicare Other | Admitting: Internal Medicine

## 2022-08-23 ENCOUNTER — Encounter: Payer: Self-pay | Admitting: Internal Medicine

## 2022-08-23 VITALS — BP 120/64 | HR 74 | Temp 97.8°F | Ht 66.5 in | Wt 213.0 lb

## 2022-08-23 DIAGNOSIS — J849 Interstitial pulmonary disease, unspecified: Secondary | ICD-10-CM | POA: Diagnosis not present

## 2022-08-23 DIAGNOSIS — R0609 Other forms of dyspnea: Secondary | ICD-10-CM | POA: Diagnosis not present

## 2022-08-23 NOTE — Therapy (Signed)
OUTPATIENT PHYSICAL THERAPY TREATMENT NOTE   Patient Name: [name] MRN: [id number] DOB:1951-07-28, 72 y.o., female Today's Date: 08/24/2022  PCP: Camillia Herter, NP   REFERRING PROVIDER: Dorna Mai, MD  END OF SESSION:   PT End of Session - 08/24/22 1237     Visit Number 4    Number of Visits 13    Date for PT Re-Evaluation 09/21/22    Authorization Type MEDICARE PART A AND B    Progress Note Due on Visit 10    PT Start Time 1230    PT Stop Time 1325    PT Time Calculation (min) 55 min    Activity Tolerance Patient tolerated treatment well    Behavior During Therapy WFL for tasks assessed/performed              Past Medical History:  Diagnosis Date   AICD (automatic cardioverter/defibrillator) present    Anxiety    Arthritis    Back pain    Breast cancer ([place]) 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   Bronchiectasis ([place])    Cerebral aneurysm, nonruptured    had a clip put in   CHF (congestive heart failure) (HCC)    Clostridium difficile infection    Depressive disorder, not elsewhere classified    Diverticulosis of colon (without mention of hemorrhage)    Esophageal reflux    Fatty liver    Fibromyalgia    Gastritis    GERD (gastroesophageal reflux disease)    GI bleed 2004   Glaucoma    Hiatal hernia    History of COVID-19 05/04/2021   Hyperlipidemia    Hypertension    Hypothyroidism    Internal hemorrhoids    Joint pain    Multifocal pneumonia 06/03/2021   Obstructive sleep apnea (adult) (pediatric)    Osteoarthritis    Ostium secundum type atrial septal defect    Other chronic nonalcoholic liver disease    Other pulmonary embolism and infarction    Palpitations    Paroxysmal ventricular tachycardia ([place])    Personal history of radiation therapy    PONV (postoperative nausea and vomiting)    Presence of permanent cardiac pacemaker    PUD (peptic ulcer disease)    Radiation 02/03/15-03/10/15   Right Breast   Sarcoid     per pt , not sure   Schatzki's ring    Shortness of breath    Sleep apnea    Stroke [facility]) 2013   tia/ pt feels it was around 2008 0r 2009   Takotsubo syndrome    Tubular adenoma of colon    Unspecified transient cerebral ischemia    Unspecified vitamin D deficiency    Past Surgical History:  Procedure Laterality Date   ABI  2006   normal   BREAST EXCISIONAL BIOPSY     BREAST LUMPECTOMY Right 2016   BREAST LUMPECTOMY WITH RADIOACTIVE SEED LOCALIZATION Right 01/03/2015   Procedure: BREAST LUMPECTOMY WITH RADIOACTIVE SEED LOCALIZATION;  Surgeon: Autumn Messing III, MD;  Location: [place];  Service: General;  Laterality: Right;   CARDIAC DEFIBRILLATOR PLACEMENT  2006; 2012   BSX single chamber ICD   carotid dopplers  2006   neg   CEREBRAL ANEURYSM REPAIR  02/1999   COLONOSCOPY     HAMMER TOE SURGERY  11/15/2020   3rd digit bilateral feet    hospitalization  2004   GI bleed, PUD, diverticulosis (EGD,colonscopy)   hospitalization     PE, NSVT, s/p defib  KNEE ARTHROSCOPY     bilateral   LEFT HEART CATHETERIZATION WITH CORONARY ANGIOGRAM N/A 02/22/2012   Procedure: LEFT HEART CATHETERIZATION WITH CORONARY ANGIOGRAM;  Surgeon: Burnell Blanks, MD;  Location: [facility] C[facility] LAB;  Service: Cardiovascular;  Laterality: N/A;   PARTIAL HYSTERECTOMY     Fibroids   TONGUE BIOPSY  12/12/2017   due to sore tongue and white patches/abnormal cells   TOTAL KNEE ARTHROPLASTY Left 02/13/2016   Procedure: TOTAL KNEE ARTHROPLASTY;  Surgeon: Vickey Huger, MD;  Location: [place];  Service: Orthopedics;  Laterality: Left;   TOTAL KNEE ARTHROPLASTY Right 05/22/2021   Procedure: TOTAL KNEE ARTHROPLASTY;  Surgeon: Vickey Huger, MD;  Location: [facility];  Service: Orthopedics;  Laterality: Right;  with block   UPPER GASTROINTESTINAL ENDOSCOPY     Patient Active Problem List   Diagnosis Date Noted   Partial symptomatic epilepsy with complex partial seizures, not intractable, without status epilepticus ([place])  08/10/2022   Low back pain 08/10/2022   Gait abnormality 05/04/2022   Muscle spasm 05/04/2022   Upper airway cough syndrome 01/19/2022   Chronic left shoulder pain 11/13/2021   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   Localized swelling of right lower leg 08/29/2021   Tonsillar exudate 08/29/2021   Candida vaginitis 08/28/2021   Chronic pain syndrome 08/25/2021   Multifocal pneumonia 06/03/2021   Pre-operative respiratory examination 05/18/2021   Statin myopathy 04/19/2021   Pain in joint of right shoulder 02/10/2021   Arthritis of both acromioclavicular joints 01/02/2021   Trochanteric bursitis of right hip 10/03/2020   Tailbone injury 05/05/2020   UTI (urinary tract infection) 05/05/2020   Insomnia 05/05/2020   Leg cramps 02/16/2020   Dysuria 02/16/2020   Right hip impingement syndrome 02/01/2020   Right bicipital tenosynovitis 02/01/2020   Bronchiectasis without complication ([place]) 62/95/2841   Myofascial pain 12/21/2019   Estrogen deficiency 12/10/2019   Obesity (BMI 30-39.9) 12/10/2019   Bronchiectasis with (acute) exacerbation ([place]) 10/23/2019   Elevated LFTs 10/15/2019   Lower abdominal pain 09/01/2019   Neck pain 10/25/2017   Paresthesias in left hand 10/25/2017   Paresthesia of foot, bilateral 04/12/2017   Primary osteoarthritis of both hands 09/27/2016   Primary osteoarthritis of both knees 09/27/2016   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   Nasopharyngitis, 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/2016   S/P total knee replacement 02/13/2016   Chronic pain of both knees 12/06/2015   Globus pharyngeus 05/17/2015   Genetic testing 12/23/2014   Family history of breast cancer    Family history of colon cancer    Family history of pancreatic cancer    History of breast cancer 11/23/2014   Allergy to ACE inhibitors 09/27/2014   Prediabetes 06/23/2013   Muscle spasms of both lower extremities 01/15/2013   Hx of Clostridium difficile infection 10/10/2012   Dyspnea on exertion 04/02/2012    NICM (nonischemic cardiomyopathy) ([place]) 03/07/2012   Post-menopausal 01/11/2012   Atypical chest pain 11/07/2011   Obstructive sleep apnea 04/04/2010   V-tach ([place]) 01/03/2009   ICD  [employer]  Single chamber 01/03/2009   PFO (patent foramen ovale) 09/29/2008   Vitamin D deficiency 08/17/2008   Hypothyroidism 11/18/2006   Hyperlipidemia 11/18/2006   Depression with anxiety 11/18/2006   Essential hypertension 11/18/2006   Mitral valve prolapse 11/18/2006   Cerebral aneurysm 11/18/2006   Allergic rhinitis 11/18/2006   GERD 11/18/2006   Diverticulosis of colon 11/18/2006   Fatty liver 11/18/2006   Fibromyalgia 11/18/2006    REFERRING DIAG: M54.50 (ICD-10-CM) - Left-sided low back pain without sciatica,  unspecified chronicity; M25.552 (ICD-10-CM) - Left hip pain  THERAPY DIAG:  Chronic bilateral low back pain without sciatica  Muscle weakness (generalized)  Pain in left hip  Cramp and spasm  Rationale for Evaluation and Treatment Rehabilitation  PERTINENT HISTORY:  Automatic cardioverter/defibrillator; Bilat knee replacements; arthritis; fibromyalgia; depression, High BMI  PRECAUTIONS: None  SUBJECTIVE:                                                                                                                                                                                      SUBJECTIVE STATEMENT:  Back is  4/10, hip 7/10, Left shoulder 3/10. Pt reports her L hip felt better yesterday, but today it is bothering her more. Pt reports pain into the groin.  PAIN:  Are you having pain? Yes: NPRS scale: 7/10 L hip Pain location: L lowback, gluteal, lat hip, groin, and ant thigh Pain description: ache, sharp occasionally, constant Aggravating factors: Walking, sleeping, in/out of car, bending over Relieving factors: Muscle relaxors, heat pad, tylenol On Eval pain range=6-8/10   OBJECTIVE: (objective measures completed at initial evaluation unless otherwise  dated)   Pt reports xrays were taken with [facility] yesterday. He was able to recall there was no fracture, proper spacing for the joint   PATIENT SURVEYS:  FOTO: Perceived function   38%, predicted   55%    SCREENING FOR RED FLAGS: Bowel or bladder incontinence: No Spinal tumors: No Compression fracture: No   COGNITION: Overall cognitive status: Within functional limits for tasks assessed                          SENSATION: WFL   MUSCLE LENGTH: Hamstrings: Right NT deg; Left NT deg Marcello Moores test: Right NT deg; Left NT deg   POSTURE: increased lumbar lordosis   PALPATION: TTP with increased muscle tightness to the L gluteal region   LUMBAR ROM:    AROM eval  Flexion Mod limited, low back pulling pain  Extension Mod limited, pain low back and L hip  Right lateral flexion Mod limited, pulling pain R hip  Left lateral flexion Mod limited, pain across low back  Right rotation Mod limited, pulling pain R hip and across low back  Left rotation Mod limited, pain across low back   (Blank rows = not tested)   LOWER EXTREMITY ROM:     L decreased in comparison to R  due to pain Passive  Right eval Left eval  Hip flexion   P  Hip extension   P  Hip abduction   P  Hip adduction      Hip internal rotation  P  Hip external rotation   P  Knee flexion      Knee extension      Ankle dorsiflexion      Ankle plantarflexion      Ankle inversion      Ankle eversion      P=pain  (Blank rows = not tested)   LOWER EXTREMITY MMT:   L LE strength is deceased in comparison to R due to Pain MMT Right eval Left eval  Hip flexion   P  Hip extension   P  Hip abduction   P  Hip adduction   P  Hip internal rotation   P  Hip external rotation   P  Knee flexion      Knee extension      Ankle dorsiflexion      Ankle plantarflexion      Ankle inversion      Ankle eversion      P=pain  (Blank rows = not tested)   LUMBAR SPECIAL TESTS:  Straight leg raise test: Negative, Slump  test: Negative, and SI Compression/distraction test: Negative          FADER L hip positive   FUNCTIONAL TESTS:  5 times sit to stand: 08/17/22: 40.8 sec (low mat, with UE) 2 minute walk test: 08/17/22: 185 Feet    GAIT: Distance walked: 145f Assistive device utilized: Single point cane Level of assistance: Complete Independence Comments: decreased pace   TODAY'S TREATMENT: [facility] Adult PT Treatment:                                                DATE: 08/24/22 Therapeutic Exercise: Supine Lower Trunk Rotation  - 1-2 x daily - 7 x weekly - 1 sets - 5 reps - 5 hold Supine Piriformis Stretch 2 reps - 15 hold Hooklying Single Knee to Chest  2 reps - 15 hold Supine hip add sets c ball 15 reps- 3 hold Supine Bridge 15 reps - 3 hold Hooklying Clamshell BluTB 15 reps Supine posterior pelvic tilt x15 3 sec Manual Therapy: LAD for the L LE, pt reported L hip felt better with decreased pressure Gait Training: Gait training with a SPC in R hand for her L hip pain Modalities: Cold pack to the L lateral hip 15 mins Self Care: Use of cold packs periodically at home for symptom management  [facility]                                                [facility]: 08/22/22 Therapeutic Exercise: Supine Lower Trunk Rotation  - 1-2 x daily - 7 x weekly - 1 sets - 5 reps - 5 hold Supine Piriformis Stretch 2 reps - 15 hold Hooklying Single Knee to Chest  2 reps - 15 hold Supine bridging c hip add sets c ball 15 reps- 3 hold Supine Bridge 15 reps - 3 hold Hooklying Clamshell BluTB 15 reps Manual Therapy: STM/DTM to the L piriformis and gluteal muscles Skilled palpation for TrPs and taut muscle bands for L piriformis and gluteal muscles Self Care: Sleeping positions with support to minimize L hip pressure and strain Trigger Point Dry Needling Treatment: Pre-treatment instruction: Patient instructed on dry needling  rationale, procedures, and possible side effects including pain during treatment  (achy,cramping feeling), bruising, drop of blood, lightheadedness, nausea, sweating. Patient Consent Given: Yes Education handout provided: Yes Muscles treated: L prirformis, glut minimus, medius and maximus  Needle size and number: .30x148m x 1 Electrical stimulation performed: No Parameters: N/A Treatment response/outcome: Twitch response elicited Post-treatment instructions: Patient instructed to expect possible mild to moderate muscle soreness later today and/or tomorrow. Patient instructed in methods to reduce muscle soreness and to continue prescribed HEP. If patient was dry needled over the lung field, patient was instructed on signs and symptoms of pneumothorax and, however unlikely, to see immediate medical attention should they occur. Patient was also educated on signs and symptoms of infection and to seek medical attention should they occur. Patient verbalized understanding of these instructions and education.   [facility]                                                DATE: 08/17/22 Therapeutic Exercise: Review of HEP STS from elevated Mat x 5 - needs UE- slow and guarded Seated Lumbar flexion with hands on thights Seated H/S stretch EOM x 2 - UE on thigh for support Seated marching Seated Trunk AROM rotation and side bending.   Therapeutic Activity: 2 MWT 5 x STS  Sit to supine transfers                                                                                                                           [facility]                                                [facility]: 08[facility]22 Therapeutic Exercise: Developed, instructed in, and pt completed therex as noted in HEP Self Care: Heating pad for pain management   PATIENT EDUCATION:  Education details: Eval findings, POC, HEP, self care Person educated: Patient, Parent, Spouse, and Child(ren) Education method: Explanation, Demonstration, Tactile cues, Verbal cues, and Handouts Education  comprehension: verbalized understanding, returned demonstration, verbal cues required, and tactile cues required   HOME EXERCISE PROGRAM: Access Code: [email or web address] URL: [email or web address] Date: 08/22/2022 Prepared by: AGar Ponto Exercises - Supine Lower Trunk Rotation  - 1-2 x daily - 7 x weekly - 1 sets - 5 reps - 5 hold - Supine Piriformis Stretch with Foot on Ground  - 1-2 x daily - 7 x weekly - 3 reps - 15 hold - Hooklying Single Knee to Chest  - 1-2 x daily - 7 x weekly - 1 sets - 3 reps - 15 hold - Supine Bridge  - 1-2 x daily - 7 x weekly - 1 sets - 10 reps -  3 hold - Hooklying Clamshell with Resistance  - 1 x daily - 7 x weekly - 1 sets - 10 reps - 3 hold - Supine Hip Adduction Isometric with Ball  - 1 x daily - 7 x weekly - 1 sets - 10 reps - 3 hold   ASSESSMENT:   CLINICAL IMPRESSION: Pt reported improved L hip pain yesterday after TPDN the previous day, however today the pain is worse. PT was completed for manual therapy c LAD of the L hip with pt report of decreased pressure with the L hip. Pt then completed therex for lumbopelvic/L hip flexibility and strengthening f/b a cold pack to the L lateral hip for 15 mins for symptom management. Pt is to try cold packs at home as well. Additionally, gait training c a SPC was completed c the [facility] in the R hand. Pt returned proper demonstration. To date, pt has not experienced a decrease in her L hip/gluteal/LB pain. STGs have been met. Will continue PT to address pain and strength for improved function of the L LE with less pain.   OBJECTIVE IMPAIRMENTS: decreased activity tolerance, decreased balance, difficulty walking, decreased ROM, decreased strength, increased muscle spasms, obesity, and pain.    ACTIVITY LIMITATIONS: carrying, lifting, bending, sitting, standing, squatting, sleeping, stairs, and locomotion level   PARTICIPATION LIMITATIONS: meal prep, cleaning, laundry, shopping, and community activity   PERSONAL  FACTORS: Fitness, Past/current experiences, Time since onset of injury/illness/exacerbation, and 3+ comorbidities:    Mod limited, pulling pain R hipare also affecting patient's functional outcome.    REHAB POTENTIAL: Good   CLINICAL DECISION MAKING: Evolving/moderate complexity   EVALUATION COMPLEXITY: Moderate     GOALS:   SHORT TERM GOALS: Target date: 08/24/22   Pt will be Ind in an initial HEP  Baseline: initiated Status: proper completion Goal status: MET   2.  Pt will voice understanding of measures to assist in pain reduction Baseline: initiated Status: HEP and sleeping positions with support; use of cold pack Goal status: MET   LONG TERM GOALS: Target date: 09/21/22   Pt will be Ind in a final HEP to maintain achieved LOF Baseline: initiated Goal status: INITIAL   2.  Pt's trunk ROM will improve to minimal or less for improved function and as demonstration of decreased pain Baseline: see flow sheets Goal status: INITIAL   3.  Pt will report a decreased in pain to 3/10 or less with daily activities Baseline: 6-8/10 Goal status: INITIAL   4.  Pt L hip will demonstrate ROM and strength equal to that of the R hip for improved function Baseline:  Goal status: INITIAL   5. Improve 5xSTS by MCID of 5" and 2MWT by MCID of 65f as indication of improved functional mobility  Baseline: TBA Goal status: INITIAL   6.  Pt's FOTO score will improved to the predicted value of 55% as indication of improved function  Baseline: 38% Goal status: INITIAL   PLAN:   PT FREQUENCY: 2x/week   PT DURATION: 6 weeks   PLANNED INTERVENTIONS: Therapeutic exercises, Therapeutic activity, Neuromuscular re-education, Balance training, Gait training, Patient/Family education, Self Care, Joint mobilization, Stair training, Aquatic Therapy, Dry Needling, Electrical stimulation, Spinal manipulation, Spinal mobilization, Cryotherapy, Moist heat, Taping, Traction, Ultrasound, Ionotophoresis  '4mg'$ /ml Dexamethasone, Manual therapy, and Re-evaluation.   PLAN FOR NEXT SESSION: Review FOTO; ; assess response to HEP; progress therex as indicated; use of modalities, manual therapy; and TPDN as indicated. [facility]  with [facility]-she was given info sheet.   Ask patient about hot tub -she can use draw bridge hot tub as long as they she either used one before and know that it won't negatively affect her or is cleared from their MD to use one.   Aveleen Nevers MS, PT 08/24/22 1:45 PM

## 2022-08-23 NOTE — Telephone Encounter (Signed)
Tam @ [facility] is asking for a call to discuss needed clinical information re: cloBAZam (ONFI) 10 MG tablet ,for pt.  Her call back # is [phone] please use ref# [id number]

## 2022-08-23 NOTE — Progress Notes (Signed)
OV 08/23/2022 -refer by Dr. Halford Chessman to Dr. Chase Caller at the pulmonary fibrosis center.  Subjective:  Patient ID: [name], female , DOB: 03-07-1951 , age 72 y.o. , MRN: [id number] , ADDRESS: [place] [id number] PCP Dorna Mai, MD Patient Care Team: Dorna Mai, MD as PCP - General (Family Medicine) Deboraha Sprang, MD as PCP - Electrophysiology (Cardiology) Jettie Booze, MD as PCP - Cardiology (Cardiology) Jovita Kussmaul, MD as Consulting Physician (General Surgery) Truitt Merle, MD as Consulting Physician (Hematology) Thea Silversmith, MD as Consulting Physician (Radiation Oncology) Rockwell Germany, RN as Registered Nurse Mauro Kaufmann, RN as Registered Nurse Deboraha Sprang, MD as Consulting Physician (Cardiology) Holley Bouche, NP (Inactive) as Nurse Practitioner (Nurse Practitioner) Sylvan Cheese, NP as Nurse Practitioner (Nurse Practitioner) Chesley Mires, MD as Consulting Physician (Pulmonary Disease) Jovita Kussmaul, MD as Consulting Physician (General Surgery) Bo Merino, MD as Consulting Physician (Rheumatology)  This Provider for this visit: Treatment Team:  Attending Provider: Brand Males, MD    08/23/2022 -   Chief Complaint  Patient presents with   Consult    Referred for ILD, states DOE     HPI [employer] 72 y.o. -referred by Dr. Halford Chessman to Dr. Chase Caller the fibrosis center.  History is gained by review of the chart, talking to the patient in the ILD questionnaire that the patient fell.  There is no one accompanying the patient.  She has been following with Dr. Halford Chessman at least for the last 3 to 4 years for a diagnosis of bronchiectasis.  She says at baseline she is doing fairly well but then in October 2022 she suffered from COVID-19 and then was hospitalized.  Review of the records indicate that she was hospitalized for total knee replacement of the right knee early August 2022 for 1  day.  Then a few weeks later on 06/03/2021 she was hospitalized for worsening respiratory failure which she believes was because of COVID-19 but review the records indicate that she actually had COVID in September 2022 and was treated with Paxlovid as an outpatient.  At this hospitalization the COVID was negative.  She was actually hypoxemic.  Pulmonary embolism was ruled out.  This admission RVP and COVID were normal.  Procalcitonin was normal.  Final diagnosis was acute on chronic hypoxemic respiratory failure due to bronchiectasis and pulmonary fibrosis.  She says since then she has been gradually weaned of oxygen.  She says overall she is better since the hospitalization October 2022 but she is not back to her baseline from before the hospitalization.  She has dyspnea on exertion.  Most recently November 2023 Dr. Halford Chessman did high-resolution CT scan of the chest that is concerning for honeycombing findings and progressive changes [personally visualized and I do not fully agree].  Therefore she has been referred here.  Pulmonary function testing shows still blood in DLCO but the FVC seems reduced recently.   All the question is as follows  Most recently she is use 2 L of oxygen with exertion.   [place] [facility] Comprehensive ILD Questionnaire  Symptoms:  Other than dyspnea she also has a cough.  The cough is a since December 2023.  Although currently it is not there. SYMPTOM SCALE - ILD 08/23/2022  Current weight   O2 use x  Shortness of Breath 0 -> 5 scale with 5 being worst (score 6 If unable to do)  At rest 0  Simple tasks - showers, clothes change, eating, shaving 2  Household (dishes, doing bed, laundry) 1  Shopping 1  Walking level at own pace 2  Walking up Stairs 3  Total (30-36) Dyspnea Score 9      Non-dyspnea symptoms (0-> 5 scale) 08/23/2022  How bad is your cough? 0  How bad is your fatigue 0  How bad is nausea 00  How bad is vomiting?  0  How bad is diarrhea? 0  How bad is  anxiety? 2  How bad is depression 2  Any chronic pain - if so where and how bad 1     Past Medical History :  -She marked positive for COPD according to history but does not not be correct. -She is have OSA for which she is previous Dr. Halford Chessman - She does have a longstanding history of acid reflux/hiatal hernia No history of tuberculosis -She has a history of breast cancer with radiation to the right breast in 2018 -Per the records she suffers from chronic pain syndrome with a history of right brain aneurysm clipping 2001 and cervical stenosis. -She has acid reflux disease for which she sees Dr. Hilarie Fredrickson -She has overactive bladder -She has nonischemic cardiomyopathy for which she sees Dr. Caryl Comes  -EF 35-40% in 2013 but as high as 50-55% in December 2022   ROS:  She has longstanding chronic pain and gait abnormality  FAMILY HISTORY of LUNG DISEASE:  She has arthralgia - She does have dry eyes -She does have heartburn -Denies family history of lung disease  PERSONAL EXPOSURE HISTORY:  -Denies family history of lung disease -She smoked between 1966 in 62 1/2 pack/day.  No marijuana no cocaine no vaping  HOME  EXPOSURE and HOBBY DETAILS :  -Her current home was built in 1973 and she has been in this house since the last 25 years.  She does use a CPAP mask.  She does use a nebulizer machine.  She does do some occasional gardening but otherwise detailed organic antigen history is negative.  OCCUPATIONAL HISTORY (122 questions) : She is to work for [employer] and then got disabled and then retired.  Detail organic and inorganic antigen history exposure at work is negative.  PULMONARY TOXICITY HISTORY (27 items):  -Did get rid Dacian to the right breast in 2018  INVESTIGATIONS: -     CT Chest data - HRCT Nov 2023 -personally visualized.  Not fully sure if that is actually UIP or not.  If that is UIP then that negative prognostic marker.  I am also not sure about progression.  With  this allowed to be clarified with multidisciplinary case conference.   Narrative & Impression  CLINICAL DATA:  72 year old female with history of shortness of breath. Evaluate for interstitial lung disease.   EXAM: CT CHEST WITHOUT CONTRAST   TECHNIQUE: Multidetector CT imaging of the chest was performed following the standard protocol without intravenous contrast. High resolution imaging of the lungs, as well as inspiratory and expiratory imaging, was performed.   RADIATION DOSE REDUCTION: This exam was performed according to the departmental dose-optimization program which includes automated exposure control, adjustment of the mA and/or kV according to patient size and/or use of iterative reconstruction technique.   COMPARISON:  CTA of the chest 08/30/2021.   FINDINGS: Cardiovascular: Heart size is normal. There is no significant pericardial fluid, thickening or pericardial calcification. There is aortic atherosclerosis, as well as atherosclerosis of the great vessels of the mediastinum and the coronary arteries,  including calcified atherosclerotic plaque in the left anterior descending coronary artery. Left-sided pacemaker/AICD device in place with lead tip terminating in the right ventricular apex.   Mediastinum/Nodes: No pathologically enlarged mediastinal or hilar lymph nodes. Please note that accurate exclusion of hilar adenopathy is limited on noncontrast CT scans. Esophagus is unremarkable in appearance. No axillary lymphadenopathy.   Lungs/Pleura: High-resolution images demonstrate widespread but patchy areas of ground-glass attenuation, septal thickening, subpleural reticulation, parenchymal banding, traction bronchiectasis, peripheral bronchiolectasis and extensive honeycombing. These findings have a definitive craniocaudal gradient and are progressive compared to the prior examination from 08/30/2021. Inspiratory and expiratory imaging is unremarkable.  No acute confluent consolidative airspace disease. No pleural effusions. No definite suspicious appearing pulmonary nodules or masses are noted.   Upper Abdomen: Aortic atherosclerosis. Colonic diverticulosis noted in the splenic flexure of the colon.   Musculoskeletal: There are no aggressive appearing lytic or blastic lesions noted in the visualized portions of the skeleton.   IMPRESSION: 1. 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 2. Aortic atherosclerosis, in addition to left anterior descending coronary artery disease. Assessment for potential risk factor modification, dietary therapy or pharmacologic therapy may be warranted, if clinically indicated. 3. Colonic diverticulosis without evidence of acute diverticulitis at this time.   Aortic Atherosclerosis (ICD10-I70.0).     Electronically Signed   By: Vinnie Langton M.D.   On: 07/08/2022 10:54    No results found.    PFT     Latest Ref Rng & Units 06/08/2022    3:49 PM 10/16/2018   12:55 PM 01/17/2017    2:50 PM  PFT Results  FVC-Pre L 2.33  2.52  2.63   FVC-Predicted Pre % 73  96  98   FVC-Post L 2.36  2.49  2.52   FVC-Predicted Post % 74  94  94   Pre FEV1/FVC % % 93  90  90   Post FEV1/FCV % % 93  91  92   FEV1-Pre L 2.17  2.27  2.38   FEV1-Predicted Pre % 90  111  113   FEV1-Post L 2.20  2.26  2.32   DLCO uncorrected ml/min/mmHg 12.95  12.43  15.90   DLCO UNC% % 62  59  58   DLCO corrected ml/min/mmHg 12.95   15.80   DLCO COR %Predicted % 62   57   DLVA Predicted % 93  85  80   TLC L 3.76  4.00  3.85   TLC % Predicted % 69  74  71   RV % Predicted % 55  58  55     Narrative & Impression  CLINICAL DATA:  72 year old female with history of shortness of breath. Evaluate for interstitial lung disease.   EXAM: CT CHEST WITHOUT CONTRAST   TECHNIQUE: Multidetector  CT imaging of the chest was performed following the standard protocol without intravenous contrast. High resolution imaging of the lungs, as well as inspiratory and expiratory imaging, was performed.   RADIATION DOSE REDUCTION: This exam was performed according to the departmental dose-optimization program which includes automated exposure control, adjustment of the mA and/or kV according to patient size and/or use of iterative reconstruction technique.   COMPARISON:  CTA of the chest 08/30/2021.   FINDINGS: Cardiovascular: Heart size is normal. There is no significant pericardial fluid, thickening or pericardial calcification. There is aortic atherosclerosis, as well as atherosclerosis of the great vessels of the mediastinum  and the coronary arteries, including calcified atherosclerotic plaque in the left anterior descending coronary artery. Left-sided pacemaker/AICD device in place with lead tip terminating in the right ventricular apex.   Mediastinum/Nodes: No pathologically enlarged mediastinal or hilar lymph nodes. Please note that accurate exclusion of hilar adenopathy is limited on noncontrast CT scans. Esophagus is unremarkable in appearance. No axillary lymphadenopathy.   Lungs/Pleura: High-resolution images demonstrate widespread but patchy areas of ground-glass attenuation, septal thickening, subpleural reticulation, parenchymal banding, traction bronchiectasis, peripheral bronchiolectasis and extensive honeycombing. These findings have a definitive craniocaudal gradient and are progressive compared to the prior examination from 08/30/2021. Inspiratory and expiratory imaging is unremarkable. No acute confluent consolidative airspace disease. No pleural effusions. No definite suspicious appearing pulmonary nodules or masses are noted.   Upper Abdomen: Aortic atherosclerosis. Colonic diverticulosis noted in the splenic flexure of the colon.   Musculoskeletal: There  are no aggressive appearing lytic or blastic lesions noted in the visualized portions of the skeleton.   IMPRESSION: 1. 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 2. Aortic atherosclerosis, in addition to left anterior descending coronary artery disease. Assessment for potential risk factor modification, dietary therapy or pharmacologic therapy may be warranted, if clinically indicated. 3. Colonic diverticulosis without evidence of acute diverticulitis at this time.   Aortic Atherosclerosis (ICD10-I70.0).     Electronically Signed   By: Vinnie Langton M.D.   On: 07/08/2022 10:54    Latest Reference Range & Units 07/17/22 15:05  IgE (Immunoglobulin E), Serum 6 - 495 IU/mL 37  Anti Nuclear Antibody (ANA) NEGATIVE  NEGATIVE  ANCA SCREEN Negative  Negative  RA Latex Turbid. <14.0 IU/mL <10.0  Scleroderma (Scl-70) (ENA) Antibody, IgG 0.0 - 0.9 AI <0.2    has a past medical history of AICD (automatic cardioverter/defibrillator) present, Anxiety, Arthritis, Back pain, Breast cancer ([place]) (2016), Bronchiectasis ([place]), Cerebral aneurysm, nonruptured, CHF (congestive heart failure) ([place]), Clostridium difficile infection, Depressive disorder, not elsewhere classified, Diverticulosis of colon (without mention of hemorrhage), Esophageal reflux, Fatty liver, Fibromyalgia, Gastritis, GERD (gastroesophageal reflux disease), GI bleed (2004), Glaucoma, Hiatal hernia, History of COVID-19 (05/04/2021), Hyperlipidemia, Hypertension, Hypothyroidism, Internal hemorrhoids, Joint pain, Multifocal pneumonia (06/03/2021), Obstructive sleep apnea (adult) (pediatric), Osteoarthritis, Ostium secundum type atrial septal defect, Other chronic nonalcoholic liver disease, Other pulmonary embolism and infarction, Palpitations, Paroxysmal ventricular tachycardia ([place]), Personal history of  radiation therapy, PONV (postoperative nausea and vomiting), Presence of permanent cardiac pacemaker, PUD (peptic ulcer disease), Radiation (02/03/15-03/10/15), Sarcoid, Schatzki's ring, Shortness of breath, Sleep apnea, Stroke ([place]) (2013), Takotsubo syndrome, Tubular adenoma of colon, Unspecified transient cerebral ischemia, and Unspecified vitamin D deficiency.   reports that she quit smoking about 33 years ago. Her smoking use included cigarettes. She has a 15.00 pack-year smoking history. She has been exposed to tobacco smoke. She has never used smokeless tobacco.  Past Surgical History:  Procedure Laterality Date   ABI  2006   normal   BREAST EXCISIONAL BIOPSY     BREAST LUMPECTOMY Right 2016   BREAST LUMPECTOMY WITH RADIOACTIVE SEED LOCALIZATION Right 01/03/2015   Procedure: BREAST LUMPECTOMY WITH RADIOACTIVE SEED LOCALIZATION;  Surgeon: Autumn Messing III, MD;  Location: [facility];  Service: General;  Laterality: Right;   CARDIAC DEFIBRILLATOR PLACEMENT  2006; 2012   BSX single chamber ICD   carotid dopplers  2006   neg   CEREBRAL ANEURYSM REPAIR  02/1999   COLONOSCOPY     HAMMER TOE SURGERY  11/15/2020  3rd digit bilateral feet    hospitalization  2004   GI bleed, PUD, diverticulosis (EGD,colonscopy)   hospitalization     PE, NSVT, s/p defib   KNEE ARTHROSCOPY     bilateral   LEFT HEART CATHETERIZATION WITH CORONARY ANGIOGRAM N/A 02/22/2012   Procedure: LEFT HEART CATHETERIZATION WITH CORONARY ANGIOGRAM;  Surgeon: Burnell Blanks, MD;  Location: [facility] CATH LAB;  Service: Cardiovascular;  Laterality: N/A;   PARTIAL HYSTERECTOMY     Fibroids   TONGUE BIOPSY  12/12/2017   due to sore tongue and white patches/abnormal cells   TOTAL KNEE ARTHROPLASTY Left 02/13/2016   Procedure: TOTAL KNEE ARTHROPLASTY;  Surgeon: Vickey Huger, MD;  Location: [facility];  Service: Orthopedics;  Laterality: Left;   TOTAL KNEE ARTHROPLASTY Right 05/22/2021   Procedure: TOTAL KNEE ARTHROPLASTY;  Surgeon:  Vickey Huger, MD;  Location: [facility];  Service: Orthopedics;  Laterality: Right;  with block   UPPER GASTROINTESTINAL ENDOSCOPY      Allergies  Allergen Reactions   Ace Inhibitors Swelling    Angioedema; makes tongue "break out"    Amitriptyline Hcl Other (See Comments)     makes her too sleepy!   Atorvastatin Other (See Comments)    SEVERE MYALGIA   Cymbalta [Duloxetine Hcl] Nausea Only and Other (See Comments)    Sleepiness/ sick   Dilantin [Phenytoin Sodium Extended] Rash    Severe rash   Paroxetine Nausea Only    Rapid heartbeat   Ramipril Other (See Comments)    TONGUE ULCERS    Rosuvastatin Other (See Comments)    SEVERE MYALGIA   Carbamazepine Rash   Codeine Itching   Paxlovid [Nirmatrelvir-Ritonavir] Itching   Phenytoin Sodium Extended Rash   Simvastatin Other (See Comments)    Increase in CK, myalgias   Wellbutrin [Bupropion] Palpitations    Immunization History  Administered Date(s) Administered   Fluad Quad(high Dose 65+) 04/17/2019, 04/30/2020   H1N1 07/24/2008   Influenza Split 07/17/2011, 09/27/2011, 04/29/2012   Influenza Whole 07/20/2004, 07/04/2007, 05/07/2008, 05/19/2009, 05/17/2010   Influenza, High Dose Seasonal PF 05/30/2017, 05/21/2018   Influenza,inj,Quad PF,6+ Mos 05/20/2013, 05/06/2014, 05/17/2015, 05/15/2016, 04/27/2021   Influenza-Unspecified 05/07/2018   PFIZER(Purple Top)SARS-COV-2 Vaccination 09/25/2019, 10/16/2019, 06/04/2020, 01/04/2021, 08/06/2021   Pfizer Covid-19 Vaccine Bivalent Booster 69yr & up 05/07/2022   Pneumococcal Conjugate-13 11/22/2015, 08/06/2021   Pneumococcal Polysaccharide-23 05/20/2002, 05/07/2008, 06/25/2014   Respiratory Syncytial Virus Vaccine,Recomb Aduvanted(Arexvy) 06/26/2022   Td 09/07/2009   Zoster, Live 09/21/2011    Family History  Problem Relation Age of Onset   Diabetes Mother    Alzheimer's disease Mother    Hypertension Mother    Obesity Mother    Heart disease Father    Seizures Sister     Allergies Sister    Heart disease Sister    Other Brother        Thyroid problem 10/2016   Heart disease Brother    Prostate cancer Brother 613      same brother as throat cancer   Throat cancer Brother        dx in his 440s also a smoker   Cancer Maternal Grandmother 665      colon cancer or abdominal cancer   Lung cancer Maternal Grandfather 78   Heart disease Son        Cardiac Arrest 07/2016   Breast cancer Maternal Aunt 55   Colon cancer Maternal Aunt 61       same sister as breast at 530  Breast  cancer Maternal Aunt        dx in her 71s   Lung cancer Maternal Uncle    Pancreatic cancer Cousin 61       maternal first cousin   Breast cancer Cousin        paternal first cousin twice removed died in her 42s   Stroke Neg Hx      Current Outpatient Medications:    acetaminophen (TYLENOL) 500 MG tablet, Take 500 mg by mouth every 6 (six) hours as needed for moderate pain., Disp: , Rfl:    albuterol (PROVENTIL) (2.5 MG/3ML) 0.083% nebulizer solution, USE 1 VIAL IN NEBULIZER EVERY 6 HOURS (Patient taking differently: Take 2.5 mg by nebulization every 6 (six) hours as needed for shortness of breath.), Disp: 120 mL, Rfl: 11   albuterol (VENTOLIN HFA) 108 (90 Base) MCG/ACT inhaler, Inhale 2 puffs into the lungs every 6 (six) hours as needed for wheezing or shortness of breath., Disp: 18 g, Rfl: 3   Ascorbic Acid (VITAMIN C) 1000 MG tablet, Take 1,000 mg by mouth daily., Disp: , Rfl:    aspirin EC 81 MG tablet, Take 81 mg by mouth daily. Swallow whole., Disp: , Rfl:    azelastine (ASTELIN) 0.1 % nasal spray, Place 1 spray into both nostrils 2 (two) times daily. Use in each nostril as directed, Disp: 90 mL, Rfl: 3   baclofen (LIORESAL) 10 MG tablet, Take 1 tablet (10 mg total) by mouth at bedtime as needed for muscle spasms., Disp: 90 tablet, Rfl: 1   budesonide-formoterol (SYMBICORT) 80-4.5 MCG/ACT inhaler, Inhale 1 puff into the lungs 2 (two) times daily., Disp: 1 each, Rfl: 12    cefUROXime (CEFTIN) 500 MG tablet, Take 1 tablet (500 mg total) by mouth 2 (two) times daily with a meal., Disp: 14 tablet, Rfl: 0   cetirizine (ZYRTEC) 10 MG tablet, Take 10 mg by mouth daily as needed for allergies., Disp: , Rfl:    Cholecalciferol (DIALYVITE VITAMIN D 5000) 125 MCG (5000 UT) capsule, Take 5,000 Units by mouth daily., Disp: , Rfl:    cloBAZam (ONFI) 10 MG tablet, Take 1 tablet (10 mg total) by mouth 2 (two) times daily., Disp: 60 tablet, Rfl: 5   diclofenac Sodium (VOLTAREN) 1 % GEL, Apply 2-4 grams to affected joint 4 times daily as needed. (Patient taking differently: Apply 2-4 g topically 4 (four) times daily as needed (joint pain).), Disp: 400 g, Rfl: 2   diltiazem (CARDIZEM CD) 120 MG 24 hr capsule, Take 1 capsule (120 mg total) by mouth daily. Do Not start this medication until after you wean off Carvedilol., Disp: 90 capsule, Rfl: 2   Empagliflozin (JARDIANCE PO), Take by mouth., Disp: , Rfl:    ezetimibe (ZETIA) 10 MG tablet, TAKE 1 TABLET EVERY DAY, Disp: 90 tablet, Rfl: 0   famotidine (PEPCID) 40 MG tablet, Take 1 tablet (40 mg total) by mouth at bedtime., Disp: 360 tablet, Rfl: 6   fexofenadine (ALLEGRA) 180 MG tablet, Take 180 mg by mouth daily as needed for allergies (alternates with zyrtec sometimes)., Disp: , Rfl:    fluticasone (CUTIVATE) 0.05 % cream, Apply topically 2 (two) times daily., Disp: 60 g, Rfl: 2   fluticasone (FLONASE) 50 MCG/ACT nasal spray, Place 2 sprays into both nostrils daily as needed for allergies or rhinitis., Disp: 48 g, Rfl: 3   FOLIC ACID PO, Take 1 tablet by mouth daily., Disp: , Rfl:    furosemide (LASIX) 20 MG tablet, TAKE 2 TABLETS (40  MG) 3 DAYS PER WEEK AND TAKE 1 TABLET (20 MG) THE OTHER DAYS OF THE WEEK, Disp: 130 tablet, Rfl: 1   gabapentin (NEURONTIN) 100 MG capsule, Take 1 capsule (100 mg total) by mouth 3 (three) times daily., Disp: 90 capsule, Rfl: 6   gabapentin (NEURONTIN) 300 MG capsule, Take 1 capsule (300 mg total) by mouth  at bedtime., Disp: 30 capsule, Rfl: 2   hydroxypropyl methylcellulose / hypromellose (ISOPTO TEARS / GONIOVISC) 2.5 % ophthalmic solution, Place 1 drop into both eyes at bedtime., Disp: , Rfl:    JARDIANCE 10 MG TABS tablet, Take 10 mg by mouth daily., Disp: , Rfl:    latanoprost (XALATAN) 0.005 % ophthalmic solution, Place 1 drop into both eyes at bedtime., Disp: , Rfl:    levETIRAcetam (KEPPRA) 500 MG tablet, Take 1 tablet (500 mg total) by mouth 2 (two) times daily., Disp: 180 tablet, Rfl: 1   levothyroxine (SYNTHROID) 50 MCG tablet, TAKE 1 TABLET EVERY DAY, Disp: 90 tablet, Rfl: 1   magnesium oxide (MAG-OX) 400 MG tablet, Take 1 tablet (400 mg total) by mouth 2 (two) times daily., Disp: 180 tablet, Rfl: 3   meloxicam (MOBIC) 15 MG tablet, Take 15 mg by mouth daily., Disp: , Rfl:    mometasone (ELOCON) 0.1 % lotion, Apply 1 application topically daily as needed (rash)., Disp: 60 mL, Rfl: 1   montelukast (SINGULAIR) 10 MG tablet, TAKE 1 TABLET AT BEDTIME, Disp: 90 tablet, Rfl: 3   Multiple Vitamins-Minerals (MULTIVITAMIN PO), Take 1 tablet by mouth daily., Disp: , Rfl:    nitroGLYCERIN (NITROSTAT) 0.4 MG SL tablet, DISSOLVE ONE TABLET UNDER THE TONGUE EVERY 5 MINUTES AS NEEDED FOR CHEST PAIN.  DO NOT EXCEED A TOTAL OF 3 DOSES IN 15 MINUTES (Patient taking differently: Place 0.4 mg under the tongue every 5 (five) minutes as needed for chest pain.), Disp: 25 tablet, Rfl: 0   nystatin (MYCOSTATIN) 100000 UNIT/ML suspension, Take 5 mLs (500,000 Units total) by mouth 4 (four) times daily., Disp: 60 mL, Rfl: 0   pantoprazole (PROTONIX) 40 MG tablet, Take 1 tablet (40 mg total) by mouth daily., Disp: 90 tablet, Rfl: 3   polyethylene glycol powder (GLYCOLAX/MIRALAX) powder, Take 17 grams by mouth daily, Disp: 1080 g, Rfl: 0   potassium chloride (KLOR-CON M) 10 MEQ tablet, TAKE 1/2 TABLET EVERY DAY (CALL OFFICE TO SCHEDULE YEARLY APPT), Disp: 30 tablet, Rfl: 11   Respiratory Therapy Supplies (FLUTTER) DEVI,  1 Device by Does not apply route as needed., Disp: 1 each, Rfl: 0   RSV vaccine recomb adjuvanted (AREXVY) 120 MCG/0.5ML injection, Inject into the muscle., Disp: 0.5 mL, Rfl: 0   sodium chloride HYPERTONIC 3 % nebulizer solution, Take by nebulization 2 (two) times daily as needed for cough. 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, Disp: 750 mL, Rfl: 12   traMADol (ULTRAM) 50 MG tablet, Take 1 tablet (50 mg total) by mouth every 12 (twelve) hours as needed., Disp: 180 tablet, Rfl: 1   traZODone (DESYREL) 100 MG tablet, TAKE 1 TO 1 AND 1/2 TABLETS AT BEDTIME, Disp: 135 tablet, Rfl: 1   triamcinolone cream (KENALOG) 0.1 %, Apply 1 Application topically 2 (two) times daily., Disp: 453.6 g, Rfl: 0      Objective:   Vitals:   08/23/22 1629  BP: 120/64  Pulse: 74  Temp: 97.8 F (36.6 C)  TempSrc: Oral  SpO2: 100%  Weight: 213 lb (96.6 kg)  Height: 5' 6.5" (1.689 m)    Estimated body mass index is  33.86 kg/m as calculated from the following:   Height as of this encounter: 5' 6.5" (1.689 m).   Weight as of this encounter: 213 lb (96.6 kg).  _0 @  Filed Weights   08/23/22 1629  Weight: 213 lb (96.6 kg)     Physical Exam    General: No distress.  Looks well Neuro: Alert and Oriented x 3. GCS 15. Speech normal Psych: Pleasant Resp:  Barrel Chest - no.  Wheeze - no, Crackles - yes, No overt respiratory distress CVS: Normal heart sounds. Murmurs - no Ext: Stigmata of Connective Tissue Disease - no HEENT: Normal upper airway. PEERL +. No post nasal drip        Assessment:       ICD-10-CM   1. ILD (interstitial lung disease) (HCC)  J84.9 Hypersensitivity Pneumonitis    CBC w/Diff    Basic Metabolic Panel (BMET)    Hepatic function panel    Aldolase    CK Total (and CKMB)    Sjogren's syndrome antibods(ssa + ssb)    Cyclic citrul peptide antibody, IgG    Sed Rate (ESR)    QuantiFERON-TB Gold Plus    Pulmonary function test    2. Dyspnea on exertion  R06.09 Pro b natriuretic  peptide (BNP)    QuantiFERON-TB Gold Plus    Pulmonary function test     She appears to have ILD.  At first I thought she had a COVID-related respiratory failure and all the ILD was in the aftermath of COVID but review of the records indicate that she has had some changes even at baseline.  Her admission in October 2022 was not directly COVID because she was COVID-negative PCR.  Happened a month after COVID and a few weeks after her knee surgery.  Therefore I suspect she could just have had ILD flareup and is now in a new her lower baseline.  If it is UIP then possible she has IPF.  Could also be connective tissue disease ILD although initial screening labs are negative.  Certainly we can expand on this.  At this point in time I want to get expanded autoimmune profile and also discussed in case conference especially regarding UIP which would be a negative prognostic sign and would qualify her for antifibrotic's in my view.  Also discussed about progression and then regroup.  We did briefly discussed the pros and cons of biopsies and the indications  We also discussed broadly the antifibrotic's.  She verbalized understanding for all this.    Plan:     Patient Instructions     ICD-10-CM   1. ILD (interstitial lung disease) ([place])  J84.9     2. Dyspnea on exertion  R06.09         - I am concerned you  have Interstitial Lung Disease (ILD)  -  There are MANY varieties of this. Possible COVID flared things up from baseline - To narrow down possibilities and assess severity please do the following tests  - do reamineder of autoimmune panel: Serum: ESR, , anti-CCP, ssA, ssB, Total CK,  Aldolase,  Hypersensitivity Pneumonitis Panel 08/23/2022  - do blood quantiferon gold 08/23/2022  - do cbc, bmet, BNP, LFT 08/23/2022   -  do spirometry and dlco in next 1-2 months  - will discuss in case confernce Feb 2024  - holding off on walk test 08/23/2022 due to knee issue    Followup  - late feb 2024 or  early march 2024 after completing PFT; 30  min with Lional Icenogle  - symptom score and walk test at followup  - based on repeat PFT, blood work result and case conference - discuss next steps such as biopsy v ant-fibrotic v close monitoring  ( Level 05 visit: Estb 40-54 min in  visit type: on-site physical face to visit  in total care time and counseling or/and coordination of care by this undersigned MD - Dr Brand Males. This includes one or more of the following on this same day 08/23/2022: pre-charting, chart review, note writing, documentation discussion of test results, diagnostic or treatment recommendations, prognosis, risks and benefits of management options, instructions, education, compliance or risk-factor reduction. It excludes time spent by the [place] or office staff in the care of the patient. Actual time 66 min)    SIGNATURE    Dr. Brand Males, M.D., F.C.C.P,  Pulmonary and Critical Care Medicine Staff Physician, [place] Director - Interstitial Lung Disease  Program  [facility] [place] [facility] [place], [place], [address]  Pager: [phone], If no answer or between  15:00h - 7:00h: call [phone] Telephone: [phone]  6:31 PM 08/23/2022

## 2022-08-23 NOTE — Patient Instructions (Addendum)
ICD-10-CM   1. ILD (interstitial lung disease) ([place])  J84.9     2. Dyspnea on exertion  R06.09         - I am concerned you  have Interstitial Lung Disease (ILD)  -  There are MANY varieties of this. Possible COVID flared things up from baseline - To narrow down possibilities and assess severity please do the following tests  - do reamineder of autoimmune panel: Serum: ESR, , anti-CCP, ssA, ssB, Total CK,  Aldolase,  Hypersensitivity Pneumonitis Panel 08/23/2022  - do blood quantiferon gold 08/23/2022  - do cbc, bmet, BNP, LFT 08/23/2022   -  do spirometry and dlco in next 1-2 months  - will discuss in case confernce Feb 2024  - holding off on walk test 08/23/2022 due to knee issue    Followup  - late feb 2024 or early march 2024 after completing PFT; 30 min with Davonte Siebenaler  - symptom score and walk test at followup  - based on repeat PFT, blood work result and case conference - discuss next steps such as biopsy v ant-fibrotic v close monitoring

## 2022-08-23 NOTE — Telephone Encounter (Signed)
Additional pw received via fax from [facility], I have completed and asked Judson Roch to sign since Dr. Krista Blue is out of the office.

## 2022-08-23 NOTE — Telephone Encounter (Signed)
Additional information faxed to [facility].

## 2022-08-24 ENCOUNTER — Other Ambulatory Visit (INDEPENDENT_AMBULATORY_CARE_PROVIDER_SITE_OTHER): Payer: Medicare Other

## 2022-08-24 ENCOUNTER — Ambulatory Visit: Payer: Medicare Other

## 2022-08-24 DIAGNOSIS — M25552 Pain in left hip: Secondary | ICD-10-CM

## 2022-08-24 DIAGNOSIS — J849 Interstitial pulmonary disease, unspecified: Secondary | ICD-10-CM | POA: Diagnosis not present

## 2022-08-24 DIAGNOSIS — M545 Low back pain, unspecified: Secondary | ICD-10-CM | POA: Diagnosis not present

## 2022-08-24 DIAGNOSIS — R252 Cramp and spasm: Secondary | ICD-10-CM

## 2022-08-24 DIAGNOSIS — M6281 Muscle weakness (generalized): Secondary | ICD-10-CM

## 2022-08-24 DIAGNOSIS — R0609 Other forms of dyspnea: Secondary | ICD-10-CM

## 2022-08-24 DIAGNOSIS — G8929 Other chronic pain: Secondary | ICD-10-CM

## 2022-08-24 LAB — CBC WITH DIFFERENTIAL/PLATELET
Basophils Absolute: 0 10*3/uL (ref 0.0–0.1)
Basophils Relative: 0.7 % (ref 0.0–3.0)
Eosinophils Absolute: 0.1 10*3/uL (ref 0.0–0.7)
Eosinophils Relative: 3 % (ref 0.0–5.0)
HCT: 44 % (ref 36.0–46.0)
Hemoglobin: 14.2 g/dL (ref 12.0–15.0)
Lymphocytes Relative: 20.8 % (ref 12.0–46.0)
Lymphs Abs: 1 10*3/uL (ref 0.7–4.0)
MCHC: 32.2 g/dL (ref 30.0–36.0)
MCV: 94.2 fl (ref 78.0–100.0)
Monocytes Absolute: 0.4 10*3/uL (ref 0.1–1.0)
Monocytes Relative: 9.5 % (ref 3.0–12.0)
Neutro Abs: 3.1 10*3/uL (ref 1.4–7.7)
Neutrophils Relative %: 66 % (ref 43.0–77.0)
Platelets: 208 10*3/uL (ref 150.0–400.0)
RBC: 4.67 Mil/uL (ref 3.87–5.11)
RDW: 13.1 % (ref 11.5–15.5)
WBC: 4.7 10*3/uL (ref 4.0–10.5)

## 2022-08-24 LAB — BASIC METABOLIC PANEL
BUN: 16 mg/dL (ref 6–23)
CO2: 31 mEq/L (ref 19–32)
Calcium: 9.9 mg/dL (ref 8.4–10.5)
Chloride: 104 mEq/L (ref 96–112)
Creatinine, Ser: 0.8 mg/dL (ref 0.40–1.20)
GFR: 72.85 mL/min (ref >60.00)
Glucose, Bld: 78 mg/dL (ref 70–99)
Potassium: 4.4 mEq/L (ref 3.5–5.1)
Sodium: 141 mEq/L (ref 135–145)

## 2022-08-24 LAB — HEPATIC FUNCTION PANEL
ALT: 41 U/L — ABNORMAL HIGH (ref 0–35)
AST: 39 U/L — ABNORMAL HIGH (ref 0–37)
Albumin: 4.1 g/dL (ref 3.5–5.2)
Alkaline Phosphatase: 109 U/L (ref 39–117)
Bilirubin, Direct: 0.1 mg/dL (ref 0.0–0.3)
Total Bilirubin: 0.4 mg/dL (ref 0.2–1.2)
Total Protein: 7 g/dL (ref 6.0–8.3)

## 2022-08-24 LAB — SEDIMENTATION RATE: Sed Rate: 12 mm/hr (ref 0–30)

## 2022-08-25 LAB — QUANTIFERON-TB GOLD PLUS
Mitogen-NIL: >10 IU/mL
NIL: 0.03 IU/mL
QuantiFERON-TB Gold Plus: NEGATIVE
TB1-NIL: 0 IU/mL
TB2-NIL: 0 IU/mL

## 2022-08-27 LAB — SJOGREN'S SYNDROME ANTIBODS(SSA + SSB)
SSA (Ro) (ENA) Antibody, IgG: <1 AI
SSB (La) (ENA) Antibody, IgG: <1 AI

## 2022-08-27 LAB — CYCLIC CITRUL PEPTIDE ANTIBODY, IGG: Cyclic Citrullin Peptide Ab: <16 UNITS

## 2022-08-27 NOTE — Telephone Encounter (Signed)
The patient's Clobazam 10 MG tablet was approved. This authorization is good until 08/20/2023.

## 2022-08-28 ENCOUNTER — Ambulatory Visit: Payer: Medicare Other | Admitting: Family Medicine

## 2022-08-28 ENCOUNTER — Ambulatory Visit: Payer: Medicare Other | Admitting: Physical Therapy

## 2022-08-28 ENCOUNTER — Encounter: Payer: Self-pay | Admitting: Physical Therapy

## 2022-08-28 DIAGNOSIS — R252 Cramp and spasm: Secondary | ICD-10-CM

## 2022-08-28 DIAGNOSIS — M25552 Pain in left hip: Secondary | ICD-10-CM

## 2022-08-28 DIAGNOSIS — M545 Low back pain, unspecified: Secondary | ICD-10-CM | POA: Diagnosis not present

## 2022-08-28 DIAGNOSIS — G8929 Other chronic pain: Secondary | ICD-10-CM

## 2022-08-28 DIAGNOSIS — M6281 Muscle weakness (generalized): Secondary | ICD-10-CM

## 2022-08-28 NOTE — Therapy (Signed)
OUTPATIENT PHYSICAL THERAPY TREATMENT NOTE   Patient Name: [name] MRN: [id number] DOB:31-Oct-1950, 72 y.o., female Today's Date: 08/28/2022  PCP: Camillia Herter, NP   REFERRING PROVIDER: Dorna Mai, MD  END OF SESSION:   PT End of Session - 08/28/22 1323     Visit Number 5    Number of Visits 13    Date for PT Re-Evaluation 09/21/22    Authorization Type MEDICARE PART A AND B    Progress Note Due on Visit 10    PT Start Time 1320    PT Stop Time 7035    PT Time Calculation (min) 38 min              Past Medical History:  Diagnosis Date   AICD (automatic cardioverter/defibrillator) present    Anxiety    Arthritis    Back pain    Breast cancer ([place]) 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   Bronchiectasis ([place])    Cerebral aneurysm, nonruptured    had a clip put in   CHF (congestive heart failure) (HCC)    Clostridium difficile infection    Depressive disorder, not elsewhere classified    Diverticulosis of colon (without mention of hemorrhage)    Esophageal reflux    Fatty liver    Fibromyalgia    Gastritis    GERD (gastroesophageal reflux disease)    GI bleed 2004   Glaucoma    Hiatal hernia    History of COVID-19 05/04/2021   Hyperlipidemia    Hypertension    Hypothyroidism    Internal hemorrhoids    Joint pain    Multifocal pneumonia 06/03/2021   Obstructive sleep apnea (adult) (pediatric)    Osteoarthritis    Ostium secundum type atrial septal defect    Other chronic nonalcoholic liver disease    Other pulmonary embolism and infarction    Palpitations    Paroxysmal ventricular tachycardia ([place])    Personal history of radiation therapy    PONV (postoperative nausea and vomiting)    Presence of permanent cardiac pacemaker    PUD (peptic ulcer disease)    Radiation 02/03/15-03/10/15   Right Breast   Sarcoid    per pt , not sure   Schatzki's ring    Shortness of breath    Sleep apnea    Stroke [facility]) 2013   tia/ pt feels it  was around 2008 0r 2009   Takotsubo syndrome    Tubular adenoma of colon    Unspecified transient cerebral ischemia    Unspecified vitamin D deficiency    Past Surgical History:  Procedure Laterality Date   ABI  2006   normal   BREAST EXCISIONAL BIOPSY     BREAST LUMPECTOMY Right 2016   BREAST LUMPECTOMY WITH RADIOACTIVE SEED LOCALIZATION Right 01/03/2015   Procedure: BREAST LUMPECTOMY WITH RADIOACTIVE SEED LOCALIZATION;  Surgeon: Autumn Messing III, MD;  Location: [place];  Service: General;  Laterality: Right;   CARDIAC DEFIBRILLATOR PLACEMENT  2006; 2012   BSX single chamber ICD   carotid dopplers  2006   neg   CEREBRAL ANEURYSM REPAIR  02/1999   COLONOSCOPY     HAMMER TOE SURGERY  11/15/2020   3rd digit bilateral feet    hospitalization  2004   GI bleed, PUD, diverticulosis (EGD,colonscopy)   hospitalization     PE, NSVT, s/p defib   KNEE ARTHROSCOPY     bilateral   LEFT HEART CATHETERIZATION WITH CORONARY ANGIOGRAM N/A 02/22/2012  Procedure: LEFT HEART CATHETERIZATION WITH CORONARY ANGIOGRAM;  Surgeon: Burnell Blanks, MD;  Location: [facility] CATH LAB;  Service: Cardiovascular;  Laterality: N/A;   PARTIAL HYSTERECTOMY     Fibroids   TONGUE BIOPSY  12/12/2017   due to sore tongue and white patches/abnormal cells   TOTAL KNEE ARTHROPLASTY Left 02/13/2016   Procedure: TOTAL KNEE ARTHROPLASTY;  Surgeon: Vickey Huger, MD;  Location: [place];  Service: Orthopedics;  Laterality: Left;   TOTAL KNEE ARTHROPLASTY Right 05/22/2021   Procedure: TOTAL KNEE ARTHROPLASTY;  Surgeon: Vickey Huger, MD;  Location: [facility];  Service: Orthopedics;  Laterality: Right;  with block   UPPER GASTROINTESTINAL ENDOSCOPY     Patient Active Problem List   Diagnosis Date Noted   Partial symptomatic epilepsy with complex partial seizures, not intractable, without status epilepticus ([place]) 08/10/2022   Low back pain 08/10/2022   Gait abnormality 05/04/2022   Muscle spasm 05/04/2022   Upper airway cough  syndrome 01/19/2022   Chronic left shoulder pain 11/13/2021   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   Localized swelling of right lower leg 08/29/2021   Tonsillar exudate 08/29/2021   Candida vaginitis 08/28/2021   Chronic pain syndrome 08/25/2021   Multifocal pneumonia 06/03/2021   Pre-operative respiratory examination 05/18/2021   Statin myopathy 04/19/2021   Pain in joint of right shoulder 02/10/2021   Arthritis of both acromioclavicular joints 01/02/2021   Trochanteric bursitis of right hip 10/03/2020   Tailbone injury 05/05/2020   UTI (urinary tract infection) 05/05/2020   Insomnia 05/05/2020   Leg cramps 02/16/2020   Dysuria 02/16/2020   Right hip impingement syndrome 02/01/2020   Right bicipital tenosynovitis 02/01/2020   Bronchiectasis without complication ([place]) 26/71/2458   Myofascial pain 12/21/2019   Estrogen deficiency 12/10/2019   Obesity (BMI 30-39.9) 12/10/2019   Bronchiectasis with (acute) exacerbation (HCC) 10/23/2019   Elevated LFTs 10/15/2019   Lower abdominal pain 09/01/2019   Neck pain 10/25/2017   Paresthesias in left hand 10/25/2017   Paresthesia of foot, bilateral 04/12/2017   Primary osteoarthritis of both hands 09/27/2016   Primary osteoarthritis of both knees 09/27/2016   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   Nasopharyngitis, 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/2016   S/P total knee replacement 02/13/2016   Chronic pain of both knees 12/06/2015   Globus pharyngeus 05/17/2015   Genetic testing 12/23/2014   Family history of breast cancer    Family history of colon cancer    Family history of pancreatic cancer    History of breast cancer 11/23/2014   Allergy to ACE inhibitors 09/27/2014   Prediabetes 06/23/2013   Muscle spasms of both lower extremities 01/15/2013   Hx of Clostridium difficile infection 10/10/2012   Dyspnea on exertion 04/02/2012   NICM (nonischemic cardiomyopathy) ([place]) 03/07/2012   Post-menopausal 01/11/2012   Atypical chest pain 11/07/2011    Obstructive sleep apnea 04/04/2010   V-tach ([place]) 01/03/2009   ICD  [employer]  Single chamber 01/03/2009   PFO (patent foramen ovale) 09/29/2008   Vitamin D deficiency 08/17/2008   Hypothyroidism 11/18/2006   Hyperlipidemia 11/18/2006   Depression with anxiety 11/18/2006   Essential hypertension 11/18/2006   Mitral valve prolapse 11/18/2006   Cerebral aneurysm 11/18/2006   Allergic rhinitis 11/18/2006   GERD 11/18/2006   Diverticulosis of colon 11/18/2006   Fatty liver 11/18/2006   Fibromyalgia 11/18/2006    REFERRING DIAG: M54.50 (ICD-10-CM) - Left-sided low back pain without sciatica, unspecified chronicity; M25.552 (ICD-10-CM) - Left hip pain  THERAPY DIAG:  Chronic bilateral low back pain without sciatica  Pain in left hip  Muscle weakness (generalized)  Cramp and spasm  Rationale for Evaluation and Treatment Rehabilitation  PERTINENT HISTORY:  Automatic cardioverter/defibrillator; Bilat knee replacements; arthritis; fibromyalgia; depression, High BMI  PRECAUTIONS: None  SUBJECTIVE:                                                                                                                                                                                      SUBJECTIVE STATEMENT: I am doing okay , pain is a /10 in back, shoulder, hip.   PAIN:  Are you having pain? Yes: NPRS scale: 3/10 L hip Pain location: L lowback, gluteal, lat hip, groin, and ant thigh Pain description: ache, sharp occasionally, constant Aggravating factors: Walking, sleeping, in/out of car, bending over Relieving factors: Muscle relaxors, heat pad, tylenol On Eval pain range=6-8/10   OBJECTIVE: (objective measures completed at initial evaluation unless otherwise dated)   Pt reports xrays were taken with [facility] yesterday. He was able to recall there was no fracture, proper spacing for the joint   PATIENT SURVEYS:  FOTO: Perceived function   38%, predicted   55%    SCREENING  FOR RED FLAGS: Bowel or bladder incontinence: No Spinal tumors: No Compression fracture: No   COGNITION: Overall cognitive status: Within functional limits for tasks assessed                          SENSATION: WFL   MUSCLE LENGTH: Hamstrings: Right NT deg; Left NT deg Marcello Moores test: Right NT deg; Left NT deg   POSTURE: increased lumbar lordosis   PALPATION: TTP with increased muscle tightness to the L gluteal region   LUMBAR ROM:    AROM eval  Flexion Mod limited, low back pulling pain  Extension Mod limited, pain low back and L hip  Right lateral flexion Mod limited, pulling pain R hip  Left lateral flexion Mod limited, pain across low back  Right rotation Mod limited, pulling pain R hip and across low back  Left rotation Mod limited, pain across low back   (Blank rows = not tested)   LOWER EXTREMITY ROM:     L decreased in comparison to R  due to pain Passive  Right eval Left eval  Hip flexion   P  Hip extension   P  Hip abduction   P  Hip adduction      Hip internal rotation   P  Hip external rotation   P  Knee flexion      Knee extension      Ankle dorsiflexion      Ankle plantarflexion  Ankle inversion      Ankle eversion      P=pain  (Blank rows = not tested)   LOWER EXTREMITY MMT:   L LE strength is deceased in comparison to R due to Pain MMT Right eval Left eval  Hip flexion   P  Hip extension   P  Hip abduction   P  Hip adduction   P  Hip internal rotation   P  Hip external rotation   P  Knee flexion      Knee extension      Ankle dorsiflexion      Ankle plantarflexion      Ankle inversion      Ankle eversion      P=pain  (Blank rows = not tested)   LUMBAR SPECIAL TESTS:  Straight leg raise test: Negative, Slump test: Negative, and SI Compression/distraction test: Negative          FADER L hip positive   FUNCTIONAL TESTS:  5 times sit to stand: 08/17/22: 40.8 sec (low mat, with UE) 2 minute walk test: 08/17/22: 185 Feet     GAIT: Distance walked: 159f Assistive device utilized: Single point cane Level of assistance: Complete Independence Comments: decreased pace   TODAY'S TREATMENT: [facility] Adult PT Treatment:                                                DATE: 08/28/22 Therapeutic Exercise: Nustep L4 UE/LE x 5 minutes  Standing 2 way hip with UE support x 10 each  Seated h/s stretch EOM Seated lumbar flexion Supine PPT to bridge x10  SKTC with opp leg extended Blue band clam 10 x 2 supine Hip adduction squeeze with ab draw in  Piriformis stretch LTR    [facility] Adult PT Treatment:                                                DATE: 08/24/22 Therapeutic Exercise: Supine Lower Trunk Rotation  - 1-2 x daily - 7 x weekly - 1 sets - 5 reps - 5 hold Supine Piriformis Stretch 2 reps - 15 hold Hooklying Single Knee to Chest  2 reps - 15 hold Supine hip add sets c ball 15 reps- 3 hold Supine Bridge 15 reps - 3 hold Hooklying Clamshell BluTB 15 reps Supine posterior pelvic tilt x15 3 sec Manual Therapy: LAD for the L LE, pt reported L hip felt better with decreased pressure Gait Training: Gait training with a SPC in R hand for her L hip pain Modalities: Cold pack to the L lateral hip 15 mins Self Care: Use of cold packs periodically at home for symptom management  [facility]                                                [facility]: 08[facility]22 Therapeutic Exercise: Supine Lower Trunk Rotation  - 1-2 x daily - 7 x weekly - 1 sets - 5 reps - 5 hold Supine Piriformis Stretch 2 reps - 15 hold Hooklying Single Knee to Chest  2 reps -  15 hold Supine bridging c hip add sets c ball 15 reps- 3 hold Supine Bridge 15 reps - 3 hold Hooklying Clamshell BluTB 15 reps Manual Therapy: STM/DTM to the L piriformis and gluteal muscles Skilled palpation for TrPs and taut muscle bands for L piriformis and gluteal muscles Self Care: Sleeping positions with support to minimize L hip pressure and strain Trigger Point  Dry Needling Treatment: Pre-treatment instruction: Patient instructed on dry needling rationale, procedures, and possible side effects including pain during treatment (achy,cramping feeling), bruising, drop of blood, lightheadedness, nausea, sweating. Patient Consent Given: Yes Education handout provided: Yes Muscles treated: L prirformis, glut minimus, medius and maximus  Needle size and number: .30x18m x 1 Electrical stimulation performed: No Parameters: N/A Treatment response/outcome: Twitch response elicited Post-treatment instructions: Patient instructed to expect possible mild to moderate muscle soreness later today and/or tomorrow. Patient instructed in methods to reduce muscle soreness and to continue prescribed HEP. If patient was dry needled over the lung field, patient was instructed on signs and symptoms of pneumothorax and, however unlikely, to see immediate medical attention should they occur. Patient was also educated on signs and symptoms of infection and to seek medical attention should they occur. Patient verbalized understanding of these instructions and education.   [facility]                                                [facility]: [facility] Therapeutic Exercise: Review of HEP STS from elevated Mat x 5 - needs UE- slow and guarded Seated Lumbar flexion with hands on thights Seated H/S stretch EOM x 2 - UE on thigh for support Seated marching Seated Trunk AROM rotation and side bending.   Therapeutic Activity: 2 MWT 5 x STS  Sit to supine transfers                                                                                                                           [facility]                                                [facility]: 08[facility]/22 Therapeutic Exercise: Developed, instructed in, and pt completed therex as noted in HEP Self Care: Heating pad for pain management   PATIENT EDUCATION:  Education details: Eval findings, POC, HEP, self  care Person educated: Patient, Parent, Spouse, and Child(ren) Education method: Explanation, Demonstration, Tactile cues, Verbal cues, and Handouts Education comprehension: verbalized understanding, returned demonstration, verbal cues required, and tactile cues required   HOME EXERCISE PROGRAM: Access Code: [email or web address] URL: [email or web address] Date: 08/22/2022 Prepared by: AGar Ponto Exercises - Supine Lower Trunk Rotation  - 1-2 x daily - 7 x weekly -  1 sets - 5 reps - 5 hold - Supine Piriformis Stretch with Foot on Ground  - 1-2 x daily - 7 x weekly - 3 reps - 15 hold - Hooklying Single Knee to Chest  - 1-2 x daily - 7 x weekly - 1 sets - 3 reps - 15 hold - Supine Bridge  - 1-2 x daily - 7 x weekly - 1 sets - 10 reps - 3 hold - Hooklying Clamshell with Resistance  - 1 x daily - 7 x weekly - 1 sets - 10 reps - 3 hold - Supine Hip Adduction Isometric with Ball  - 1 x daily - 7 x weekly - 1 sets - 10 reps - 3 hold   ASSESSMENT:   CLINICAL IMPRESSION: Pt reports overall improvement, rating pain at 3/10 for hip, back, and shoulder today. Began Nustep. Able to begin standing hip AROM. She reports discomfort with supine/ hooklying positions. Continued with mobility of trunk and hips in hook lying per previous Rx. Will assess response to todays session and progress as tolerated. Pt reports left knee is improved however still fearful of give way. She will see orthopedic tomorrow for follow up.  Will continue PT to address pain and strength for improved function of the L LE with less pain.   OBJECTIVE IMPAIRMENTS: decreased activity tolerance, decreased balance, difficulty walking, decreased ROM, decreased strength, increased muscle spasms, obesity, and pain.    ACTIVITY LIMITATIONS: carrying, lifting, bending, sitting, standing, squatting, sleeping, stairs, and locomotion level   PARTICIPATION LIMITATIONS: meal prep, cleaning, laundry, shopping, and community activity    PERSONAL FACTORS: Fitness, Past/current experiences, Time since onset of injury/illness/exacerbation, and 3+ comorbidities:    Mod limited, pulling pain R hipare also affecting patient's functional outcome.    REHAB POTENTIAL: Good   CLINICAL DECISION MAKING: Evolving/moderate complexity   EVALUATION COMPLEXITY: Moderate     GOALS:   SHORT TERM GOALS: Target date: 08/24/22   Pt will be Ind in an initial HEP  Baseline: initiated Status: proper completion Goal status: MET   2.  Pt will voice understanding of measures to assist in pain reduction Baseline: initiated Status: HEP and sleeping positions with support; use of cold pack Goal status: MET   LONG TERM GOALS: Target date: 09/21/22   Pt will be Ind in a final HEP to maintain achieved LOF Baseline: initiated Goal status: INITIAL   2.  Pt's trunk ROM will improve to minimal or less for improved function and as demonstration of decreased pain Baseline: see flow sheets Goal status: INITIAL   3.  Pt will report a decreased in pain to 3/10 or less with daily activities Baseline: 6-8/10 Goal status: INITIAL   4.  Pt L hip will demonstrate ROM and strength equal to that of the R hip for improved function Baseline:  Goal status: INITIAL   5. Improve 5xSTS by MCID of 5" and 2MWT by MCID of 82f as indication of improved functional mobility  Baseline: TBA Goal status: INITIAL   6.  Pt's FOTO score will improved to the predicted value of 55% as indication of improved function  Baseline: 38% Goal status: INITIAL   PLAN:   PT FREQUENCY: 2x/week   PT DURATION: 6 weeks   PLANNED INTERVENTIONS: Therapeutic exercises, Therapeutic activity, Neuromuscular re-education, Balance training, Gait training, Patient/Family education, Self Care, Joint mobilization, Stair training, Aquatic Therapy, Dry Needling, Electrical stimulation, Spinal manipulation, Spinal mobilization, Cryotherapy, Moist heat, Taping, Traction, Ultrasound,  Ionotophoresis '4mg'$ /ml Dexamethasone, Manual therapy, and  Re-evaluation.   PLAN FOR NEXT SESSION: FOTO status ; assess response to HEP; progress therex as indicated; use of modalities, manual therapy; and TPDN as indicated. Pt is scheduled to start Aquatics at [facility] with Angus Seller [address]-she was given info sheet.  Pt has pacemaker, NO IONTO or ESTIM   Ask patient about hot tub -she can use draw bridge hot tub/whirlpool as long as she either used one before and know that it won't negatively affect her or is cleared from their MD to use one.   Hessie Diener, PTA 08/28/22 1:54 PM Phone: [phone] Fax: [phone]

## 2022-08-29 ENCOUNTER — Ambulatory Visit (INDEPENDENT_AMBULATORY_CARE_PROVIDER_SITE_OTHER): Payer: Medicare Other

## 2022-08-29 ENCOUNTER — Ambulatory Visit
Admission: RE | Admit: 2022-08-29 | Discharge: 2022-08-29 | Disposition: A | Discharge status: Home / Self Care | Payer: Medicare Other | Source: Ambulatory Visit | Attending: Neurology | Admitting: Neurology

## 2022-08-29 ENCOUNTER — Encounter: Payer: Self-pay | Admitting: Internal Medicine

## 2022-08-29 DIAGNOSIS — M62838 Other muscle spasm: Secondary | ICD-10-CM

## 2022-08-29 DIAGNOSIS — I671 Cerebral aneurysm, nonruptured: Secondary | ICD-10-CM

## 2022-08-29 DIAGNOSIS — G40209 Localization-related (focal) (partial) symptomatic epilepsy and epileptic syndromes with complex partial seizures, not intractable, without status epilepticus: Secondary | ICD-10-CM

## 2022-08-29 DIAGNOSIS — I428 Other cardiomyopathies: Secondary | ICD-10-CM | POA: Diagnosis not present

## 2022-08-29 DIAGNOSIS — E782 Mixed hyperlipidemia: Secondary | ICD-10-CM

## 2022-08-29 LAB — CUP PACEART REMOTE DEVICE CHECK
Battery Remaining Longevity: 18 mo
Battery Remaining Percentage: 18 %
Brady Statistic RV Percent Paced: 0 %
Date Time Interrogation Session: 20240110030200
HighPow Impedance: 81 Ohm
Implantable Lead Connection Status: 753985
Implantable Lead Implant Date: 20050603
Implantable Lead Location: 753860
Implantable Lead Model: 185
Implantable Lead Serial Number: 116340
Implantable Pulse Generator Implant Date: 20110505
Lead Channel Impedance Value: 615 Ohm
Lead Channel Pacing Threshold Amplitude: 0.6 V
Lead Channel Pacing Threshold Pulse Width: 0.4 ms
Lead Channel Setting Pacing Amplitude: 2.4 V
Lead Channel Setting Pacing Pulse Width: 0.4 ms
Lead Channel Setting Sensing Sensitivity: 0.4 mV
Pulse Gen Serial Number: 266301
Zone Setting Status: 755011

## 2022-08-29 LAB — CK TOTAL AND CKMB (NOT AT ARMC)
CK, MB: 1.4 ng/mL (ref 0–5.0)
Relative Index: 0.8 (ref 0–4.0)
Total CK: 172 U/L — ABNORMAL HIGH (ref 29–143)

## 2022-08-29 LAB — ALDOLASE: Aldolase: 7.9 U/L (ref <=8.1)

## 2022-08-29 NOTE — Therapy (Incomplete)
OUTPATIENT PHYSICAL THERAPY TREATMENT NOTE   Patient Name: [name] MRN: [id number] DOB:23-Jul-1951, 72 y.o., female Today's Date: 08/29/2022  PCP: Camillia Herter, NP   REFERRING PROVIDER: Dorna Mai, MD  END OF SESSION:      Past Medical History:  Diagnosis Date   AICD (automatic cardioverter/defibrillator) present    Anxiety    Arthritis    Back pain    Breast cancer ([place]) 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   Bronchiectasis ([place])    Cerebral aneurysm, nonruptured    had a clip put in   CHF (congestive heart failure) ([place])    Clostridium difficile infection    Depressive disorder, not elsewhere classified    Diverticulosis of colon (without mention of hemorrhage)    Esophageal reflux    Fatty liver    Fibromyalgia    Gastritis    GERD (gastroesophageal reflux disease)    GI bleed 2004   Glaucoma    Hiatal hernia    History of COVID-19 05/04/2021   Hyperlipidemia    Hypertension    Hypothyroidism    Internal hemorrhoids    Joint pain    Multifocal pneumonia 06/03/2021   Obstructive sleep apnea (adult) (pediatric)    Osteoarthritis    Ostium secundum type atrial septal defect    Other chronic nonalcoholic liver disease    Other pulmonary embolism and infarction    Palpitations    Paroxysmal ventricular tachycardia (HCC)    Personal history of radiation therapy    PONV (postoperative nausea and vomiting)    Presence of permanent cardiac pacemaker    PUD (peptic ulcer disease)    Radiation 02/03/15-03/10/15   Right Breast   Sarcoid    per pt , not sure   Schatzki's ring    Shortness of breath    Sleep apnea    Stroke [facility]) 2013   tia/ pt feels it was around 2008 0r 2009   Takotsubo syndrome    Tubular adenoma of colon    Unspecified transient cerebral ischemia    Unspecified vitamin D deficiency    Past Surgical History:  Procedure Laterality Date   ABI  2006   normal   BREAST EXCISIONAL BIOPSY     BREAST LUMPECTOMY Right  2016   BREAST LUMPECTOMY WITH RADIOACTIVE SEED LOCALIZATION Right 01/03/2015   Procedure: BREAST LUMPECTOMY WITH RADIOACTIVE SEED LOCALIZATION;  Surgeon: Autumn Messing III, MD;  Location: [facility];  Service: General;  Laterality: Right;   CARDIAC DEFIBRILLATOR PLACEMENT  2006; 2012   BSX single chamber ICD   carotid dopplers  2006   neg   CEREBRAL ANEURYSM REPAIR  02/1999   COLONOSCOPY     HAMMER TOE SURGERY  11/15/2020   3rd digit bilateral feet    hospitalization  2004   GI bleed, PUD, diverticulosis (EGD,colonscopy)   hospitalization     PE, NSVT, s/p defib   KNEE ARTHROSCOPY     bilateral   LEFT HEART CATHETERIZATION WITH CORONARY ANGIOGRAM N/A 02/22/2012   Procedure: LEFT HEART CATHETERIZATION WITH CORONARY ANGIOGRAM;  Surgeon: Burnell Blanks, MD;  Location: [facility] CATH LAB;  Service: Cardiovascular;  Laterality: N/A;   PARTIAL HYSTERECTOMY     Fibroids   TONGUE BIOPSY  12/12/2017   due to sore tongue and white patches/abnormal cells   TOTAL KNEE ARTHROPLASTY Left 02/13/2016   Procedure: TOTAL KNEE ARTHROPLASTY;  Surgeon: Vickey Huger, MD;  Location: [facility];  Service: Orthopedics;  Laterality: Left;   TOTAL  KNEE ARTHROPLASTY Right 05/22/2021   Procedure: TOTAL KNEE ARTHROPLASTY;  Surgeon: Vickey Huger, MD;  Location: [facility];  Service: Orthopedics;  Laterality: Right;  with block   UPPER GASTROINTESTINAL ENDOSCOPY     Patient Active Problem List   Diagnosis Date Noted   Partial symptomatic epilepsy with complex partial seizures, not intractable, without status epilepticus ([place]) 08/10/2022   Low back pain 08/10/2022   Gait abnormality 05/04/2022   Muscle spasm 05/04/2022   Upper airway cough syndrome 01/19/2022   Chronic left shoulder pain 11/13/2021   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   Localized swelling of right lower leg 08/29/2021   Tonsillar exudate 08/29/2021   Candida vaginitis 08/28/2021   Chronic pain syndrome 08/25/2021   Multifocal pneumonia 06/03/2021    Pre-operative respiratory examination 05/18/2021   Statin myopathy 04/19/2021   Pain in joint of right shoulder 02/10/2021   Arthritis of both acromioclavicular joints 01/02/2021   Trochanteric bursitis of right hip 10/03/2020   Tailbone injury 05/05/2020   UTI (urinary tract infection) 05/05/2020   Insomnia 05/05/2020   Leg cramps 02/16/2020   Dysuria 02/16/2020   Right hip impingement syndrome 02/01/2020   Right bicipital tenosynovitis 02/01/2020   Bronchiectasis without complication ([place]) 16/05/9603   Myofascial pain 12/21/2019   Estrogen deficiency 12/10/2019   Obesity (BMI 30-39.9) 12/10/2019   Bronchiectasis with (acute) exacerbation (HCC) 10/23/2019   Elevated LFTs 10/15/2019   Lower abdominal pain 09/01/2019   Neck pain 10/25/2017   Paresthesias in left hand 10/25/2017   Paresthesia of foot, bilateral 04/12/2017   Primary osteoarthritis of both hands 09/27/2016   Primary osteoarthritis of both knees 09/27/2016   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   Nasopharyngitis, 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/2016   S/P total knee replacement 02/13/2016   Chronic pain of both knees 12/06/2015   Globus pharyngeus 05/17/2015   Genetic testing 12/23/2014   Family history of breast cancer    Family history of colon cancer    Family history of pancreatic cancer    History of breast cancer 11/23/2014   Allergy to ACE inhibitors 09/27/2014   Prediabetes 06/23/2013   Muscle spasms of both lower extremities 01/15/2013   Hx of Clostridium difficile infection 10/10/2012   Dyspnea on exertion 04/02/2012   NICM (nonischemic cardiomyopathy) ([place]) 03/07/2012   Post-menopausal 01/11/2012   Atypical chest pain 11/07/2011   Obstructive sleep apnea 04/04/2010   V-tach ([place]) 01/03/2009   ICD  [employer]  Single chamber 01/03/2009   PFO (patent foramen ovale) 09/29/2008   Vitamin D deficiency 08/17/2008   Hypothyroidism 11/18/2006   Hyperlipidemia 11/18/2006   Depression with anxiety 11/18/2006    Essential hypertension 11/18/2006   Mitral valve prolapse 11/18/2006   Cerebral aneurysm 11/18/2006   Allergic rhinitis 11/18/2006   GERD 11/18/2006   Diverticulosis of colon 11/18/2006   Fatty liver 11/18/2006   Fibromyalgia 11/18/2006    REFERRING DIAG: M54.50 (ICD-10-CM) - Left-sided low back pain without sciatica, unspecified chronicity; M25.552 (ICD-10-CM) - Left hip pain  THERAPY DIAG:  No diagnosis found.  Rationale for Evaluation and Treatment Rehabilitation  PERTINENT HISTORY:  Automatic cardioverter/defibrillator; Bilat knee replacements; arthritis; fibromyalgia; depression, High BMI  PRECAUTIONS: None  SUBJECTIVE:  SUBJECTIVE STATEMENT: I am doing okay , pain is a /10 in back, shoulder, hip.   PAIN:  Are you having pain? Yes: NPRS scale: 3/10 L hip Pain location: L lowback, gluteal, lat hip, groin, and ant thigh Pain description: ache, sharp occasionally, constant Aggravating factors: Walking, sleeping, in/out of car, bending over Relieving factors: Muscle relaxors, heat pad, tylenol On Eval pain range=6-8/10   OBJECTIVE: (objective measures completed at initial evaluation unless otherwise dated)   Pt reports xrays were taken with [facility] yesterday. He was able to recall there was no fracture, proper spacing for the joint   PATIENT SURVEYS:  FOTO: Perceived function   38%, predicted   55%    SCREENING FOR RED FLAGS: Bowel or bladder incontinence: No Spinal tumors: No Compression fracture: No   COGNITION: Overall cognitive status: Within functional limits for tasks assessed                          SENSATION: WFL   MUSCLE LENGTH: Hamstrings: Right NT deg; Left NT deg Marcello Moores test: Right NT deg; Left NT deg   POSTURE: increased lumbar lordosis   PALPATION: TTP with  increased muscle tightness to the L gluteal region   LUMBAR ROM:    AROM eval  Flexion Mod limited, low back pulling pain  Extension Mod limited, pain low back and L hip  Right lateral flexion Mod limited, pulling pain R hip  Left lateral flexion Mod limited, pain across low back  Right rotation Mod limited, pulling pain R hip and across low back  Left rotation Mod limited, pain across low back   (Blank rows = not tested)   LOWER EXTREMITY ROM:     L decreased in comparison to R  due to pain Passive  Right eval Left eval  Hip flexion   P  Hip extension   P  Hip abduction   P  Hip adduction      Hip internal rotation   P  Hip external rotation   P  Knee flexion      Knee extension      Ankle dorsiflexion      Ankle plantarflexion      Ankle inversion      Ankle eversion      P=pain  (Blank rows = not tested)   LOWER EXTREMITY MMT:   L LE strength is deceased in comparison to R due to Pain MMT Right eval Left eval  Hip flexion   P  Hip extension   P  Hip abduction   P  Hip adduction   P  Hip internal rotation   P  Hip external rotation   P  Knee flexion      Knee extension      Ankle dorsiflexion      Ankle plantarflexion      Ankle inversion      Ankle eversion      P=pain  (Blank rows = not tested)   LUMBAR SPECIAL TESTS:  Straight leg raise test: Negative, Slump test: Negative, and SI Compression/distraction test: Negative          FADER L hip positive   FUNCTIONAL TESTS:  5 times sit to stand: 08/17/22: 40.8 sec (low mat, with UE) 2 minute walk test: 08/17/22: 185 Feet    GAIT: Distance walked: 160f Assistive device utilized: Single point cane Level of assistance: Complete Independence Comments: decreased pace   TODAY'S TREATMENT: [facility] Adult PT Treatment:  DATE: 08/30/22 Therapeutic Exercise: *** Manual Therapy: *** Neuromuscular re-ed: *** Therapeutic Activity: *** Modalities: *** Self  Care: ***  [facility] Adult PT Treatment:                                                DATE: 08/28/22 Therapeutic Exercise: Nustep L4 UE/LE x 5 minutes  Standing 2 way hip with UE support x 10 each  Seated h/s stretch EOM Seated lumbar flexion Supine PPT to bridge x10  SKTC with opp leg extended Blue band clam 10 x 2 supine Hip adduction squeeze with ab draw in  Piriformis stretch LTR    [facility] Adult PT Treatment:                                                DATE: 08/24/22 Therapeutic Exercise: Supine Lower Trunk Rotation  - 1-2 x daily - 7 x weekly - 1 sets - 5 reps - 5 hold Supine Piriformis Stretch 2 reps - 15 hold Hooklying Single Knee to Chest  2 reps - 15 hold Supine hip add sets c ball 15 reps- 3 hold Supine Bridge 15 reps - 3 hold Hooklying Clamshell BluTB 15 reps Supine posterior pelvic tilt x15 3 sec Manual Therapy: LAD for the L LE, pt reported L hip felt better with decreased pressure Gait Training: Gait training with a SPC in R hand for her L hip pain Modalities: Cold pack to the L lateral hip 15 mins Self Care: Use of cold packs periodically at home for symptom management  [facility] Adult PT Treatment:                                                DATE: 08/22/22 Therapeutic Exercise: Supine Lower Trunk Rotation  - 1-2 x daily - 7 x weekly - 1 sets - 5 reps - 5 hold Supine Piriformis Stretch 2 reps - 15 hold Hooklying Single Knee to Chest  2 reps - 15 hold Supine bridging c hip add sets c ball 15 reps- 3 hold Supine Bridge 15 reps - 3 hold Hooklying Clamshell BluTB 15 reps Manual Therapy: STM/DTM to the L piriformis and gluteal muscles Skilled palpation for TrPs and taut muscle bands for L piriformis and gluteal muscles Self Care: Sleeping positions with support to minimize L hip pressure and strain Trigger Point Dry Needling Treatment: Pre-treatment instruction: Patient instructed on dry needling rationale, procedures, and possible side effects including pain during  treatment (achy,cramping feeling), bruising, drop of blood, lightheadedness, nausea, sweating. Patient Consent Given: Yes Education handout provided: Yes Muscles treated: L prirformis, glut minimus, medius and maximus  Needle size and number: .30x157m x 1 Electrical stimulation performed: No Parameters: N/A Treatment response/outcome: Twitch response elicited Post-treatment instructions: Patient instructed to expect possible mild to moderate muscle soreness later today and/or tomorrow. Patient instructed in methods to reduce muscle soreness and to continue prescribed HEP. If patient was dry needled over the lung field, patient was instructed on signs and symptoms of pneumothorax and, however unlikely, to see immediate medical attention should they occur. Patient was also educated on  signs and symptoms of infection and to seek medical attention should they occur. Patient verbalized understanding of these instructions and education.   [facility] Adult PT Treatment:                                                DATE: 08/17/22 Therapeutic Exercise: Review of HEP STS from elevated Mat x 5 - needs UE- slow and guarded Seated Lumbar flexion with hands on thights Seated H/S stretch EOM x 2 - UE on thigh for support Seated marching Seated Trunk AROM rotation and side bending.   Therapeutic Activity: 2 MWT 5 x STS  Sit to supine transfers                                                                                                                           [facility] Adult PT Treatment:                                                DATE: 08/03/22 Therapeutic Exercise: Developed, instructed in, and pt completed therex as noted in HEP Self Care: Heating pad for pain management   PATIENT EDUCATION:  Education details: Eval findings, POC, HEP, self care Person educated: Patient, Parent, Spouse, and Child(ren) Education method: Explanation, Demonstration, Tactile cues, Verbal cues, and Handouts Education  comprehension: verbalized understanding, returned demonstration, verbal cues required, and tactile cues required   HOME EXERCISE PROGRAM: Access Code: [email or web address] URL: [email or web address] Date: 08/22/2022 Prepared by: Gar Ponto  Exercises - Supine Lower Trunk Rotation  - 1-2 x daily - 7 x weekly - 1 sets - 5 reps - 5 hold - Supine Piriformis Stretch with Foot on Ground  - 1-2 x daily - 7 x weekly - 3 reps - 15 hold - Hooklying Single Knee to Chest  - 1-2 x daily - 7 x weekly - 1 sets - 3 reps - 15 hold - Supine Bridge  - 1-2 x daily - 7 x weekly - 1 sets - 10 reps - 3 hold - Hooklying Clamshell with Resistance  - 1 x daily - 7 x weekly - 1 sets - 10 reps - 3 hold - Supine Hip Adduction Isometric with Ball  - 1 x daily - 7 x weekly - 1 sets - 10 reps - 3 hold   ASSESSMENT:   CLINICAL IMPRESSION: Pt reports overall improvement, rating pain at 3/10 for hip, back, and shoulder today. Began Nustep. Able to begin standing hip AROM. She reports discomfort with supine/ hooklying positions. Continued with mobility of trunk and hips in hook lying per previous Rx. Will assess response to todays session and progress as tolerated. Pt reports left knee is improved however still fearful of  give way. She will see orthopedic tomorrow for follow up.  Will continue PT to address pain and strength for improved function of the L LE with less pain.   OBJECTIVE IMPAIRMENTS: decreased activity tolerance, decreased balance, difficulty walking, decreased ROM, decreased strength, increased muscle spasms, obesity, and pain.    ACTIVITY LIMITATIONS: carrying, lifting, bending, sitting, standing, squatting, sleeping, stairs, and locomotion level   PARTICIPATION LIMITATIONS: meal prep, cleaning, laundry, shopping, and community activity   PERSONAL FACTORS: Fitness, Past/current experiences, Time since onset of injury/illness/exacerbation, and 3+ comorbidities:    Mod limited, pulling pain R hipare also  affecting patient's functional outcome.    REHAB POTENTIAL: Good   CLINICAL DECISION MAKING: Evolving/moderate complexity   EVALUATION COMPLEXITY: Moderate     GOALS:   SHORT TERM GOALS: Target date: 08/24/22   Pt will be Ind in an initial HEP  Baseline: initiated Status: proper completion Goal status: MET   2.  Pt will voice understanding of measures to assist in pain reduction Baseline: initiated Status: HEP and sleeping positions with support; use of cold pack Goal status: MET   LONG TERM GOALS: Target date: 09/21/22   Pt will be Ind in a final HEP to maintain achieved LOF Baseline: initiated Goal status: INITIAL   2.  Pt's trunk ROM will improve to minimal or less for improved function and as demonstration of decreased pain Baseline: see flow sheets Goal status: INITIAL   3.  Pt will report a decreased in pain to 3/10 or less with daily activities Baseline: 6-8/10 Goal status: INITIAL   4.  Pt L hip will demonstrate ROM and strength equal to that of the R hip for improved function Baseline:  Goal status: INITIAL   5. Improve 5xSTS by MCID of 5" and 2MWT by MCID of 18f as indication of improved functional mobility  Baseline: TBA Goal status: INITIAL   6.  Pt's FOTO score will improved to the predicted value of 55% as indication of improved function  Baseline: 38% Goal status: INITIAL   PLAN:   PT FREQUENCY: 2x/week   PT DURATION: 6 weeks   PLANNED INTERVENTIONS: Therapeutic exercises, Therapeutic activity, Neuromuscular re-education, Balance training, Gait training, Patient/Family education, Self Care, Joint mobilization, Stair training, Aquatic Therapy, Dry Needling, Electrical stimulation, Spinal manipulation, Spinal mobilization, Cryotherapy, Moist heat, Taping, Traction, Ultrasound, Ionotophoresis '4mg'$ /ml Dexamethasone, Manual therapy, and Re-evaluation.   PLAN FOR NEXT SESSION: FOTO status ; assess response to HEP; progress therex as indicated; use of  modalities, manual therapy; and TPDN as indicated. [facility] was given [facility].  Pt has pacemaker, NO IONTO or ESTIM   Ask patient about hot tub -she can use draw bridge hot tub/whirlpool as long as she either used one before and know that it won't negatively affect her or is cleared from their MD to use one.

## 2022-08-29 NOTE — Telephone Encounter (Signed)
Called pt. Scheduled a 6 mos f/u visit with Janett Billow on 7/10 @ 11:15 am per Dr. Krista Blue.

## 2022-08-29 NOTE — Telephone Encounter (Signed)
Please call patient, CT of lumbar spine showed multilevel degenerative changes, evidence of mild to moderate canal stenosis at L3-4, variable degree of foraminal narrowing  If she has significant low back pain, may refer her to orthopedic surgeon for evaluation  Please give her follow-up with nurse practitioner for 6 months  IMPRESSION: 1. Negative for lumbar fracture. 2. 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 Mild to moderate central canal stenosis L3-4. Mild central canal stenosis L2-3 and L4-5. 3. Aortic atherosclerosis.

## 2022-08-30 ENCOUNTER — Ambulatory Visit: Payer: Medicare Other

## 2022-08-30 LAB — HYPERSENSITIVITY PNEUMONITIS
A. Pullulans Abs: NEGATIVE
A.Fumigatus #1 Abs: NEGATIVE
Micropolyspora faeni, IgG: NEGATIVE
Pigeon Serum Abs: NEGATIVE
Thermoact. Saccharii: NEGATIVE
Thermoactinomyces vulgaris, IgG: NEGATIVE

## 2022-08-30 LAB — PRO B NATRIURETIC PEPTIDE: NT-Pro BNP: 76 pg/mL (ref 0–301)

## 2022-09-02 IMAGING — DX DG CHEST 2V
2 series · 2 of 2 positions shown · non-contrast
Comparison: 05/17/2021

CLINICAL DATA: History of [id number]-[id number] of COVID

EXAM:
CHEST - 2 VIEW

[chest pa]
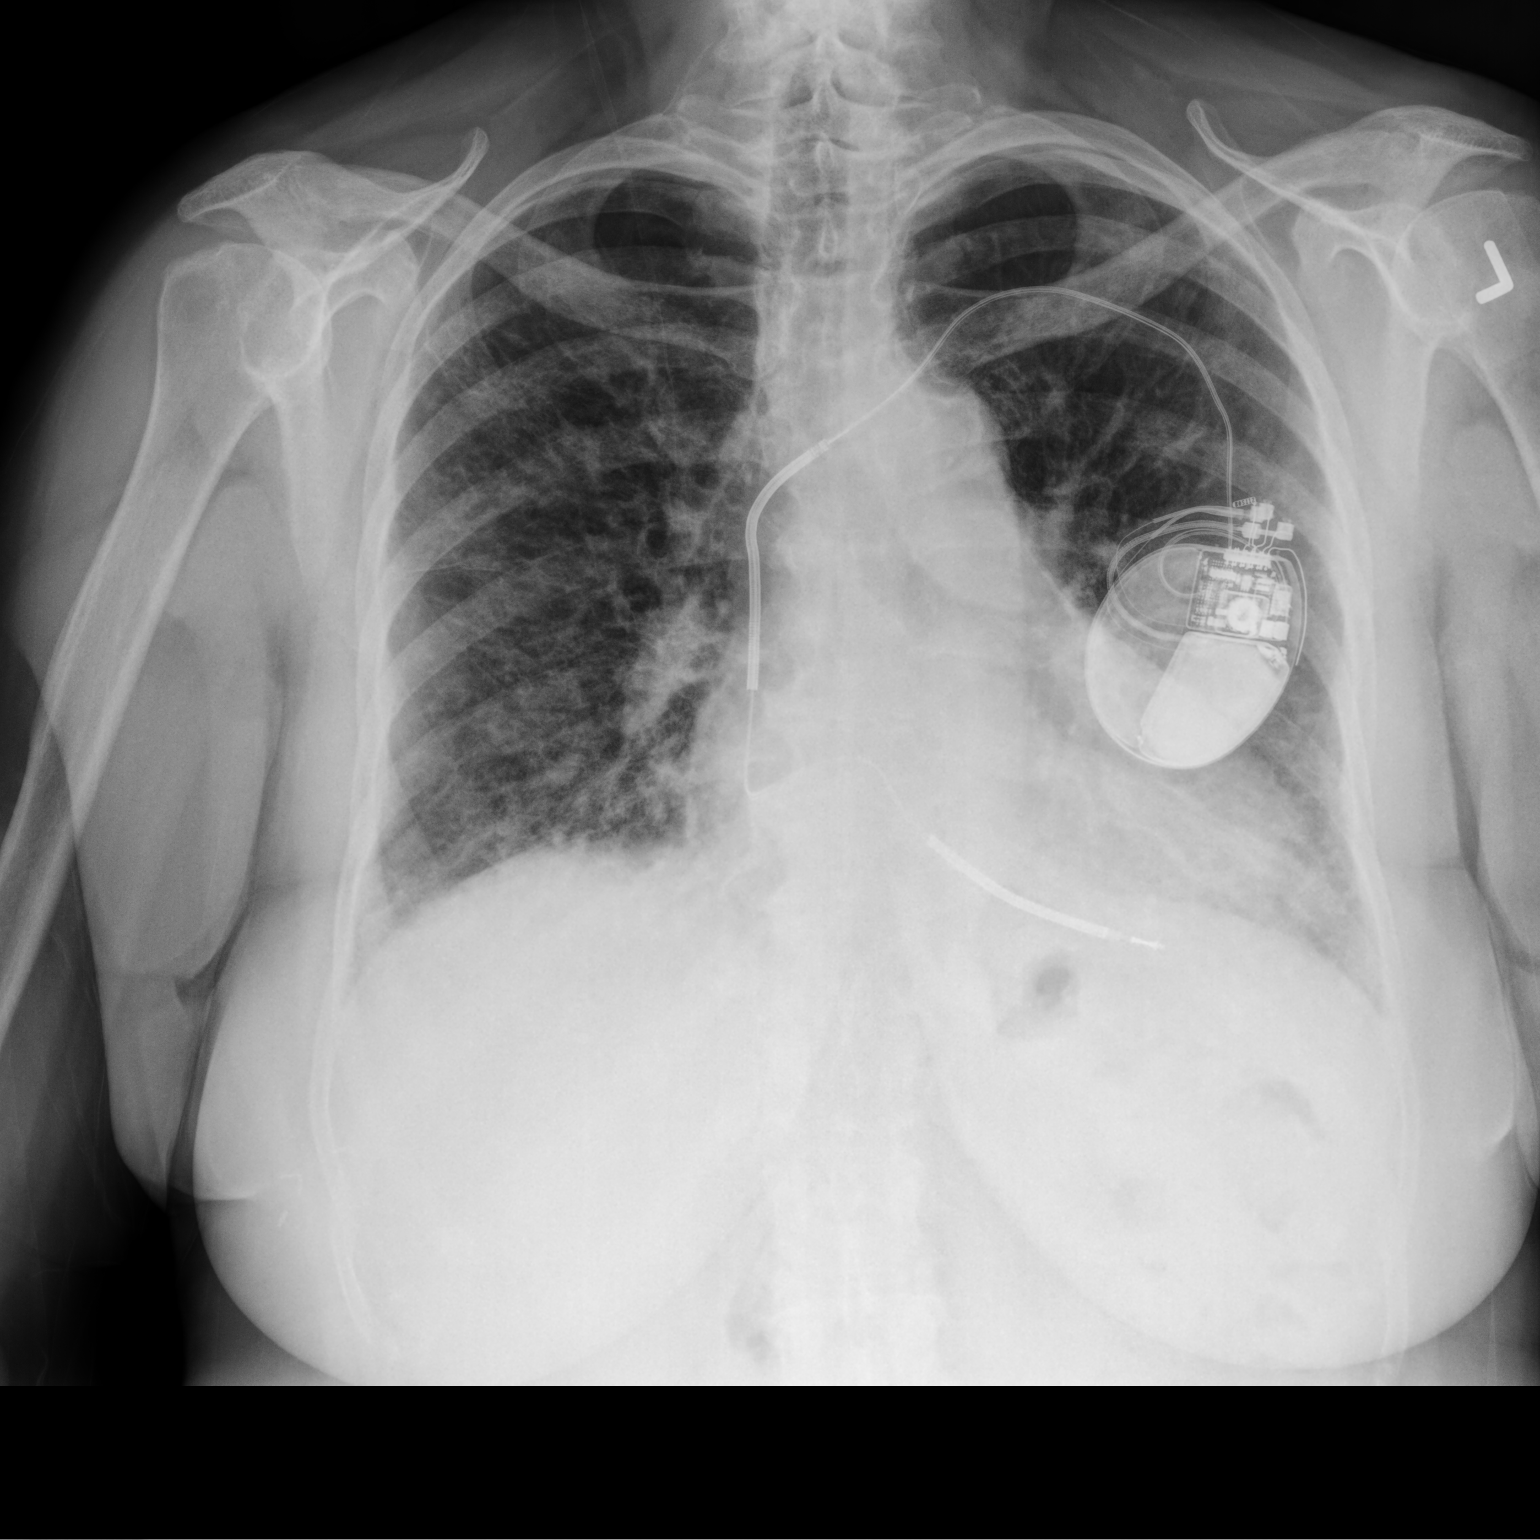

[chest lat]
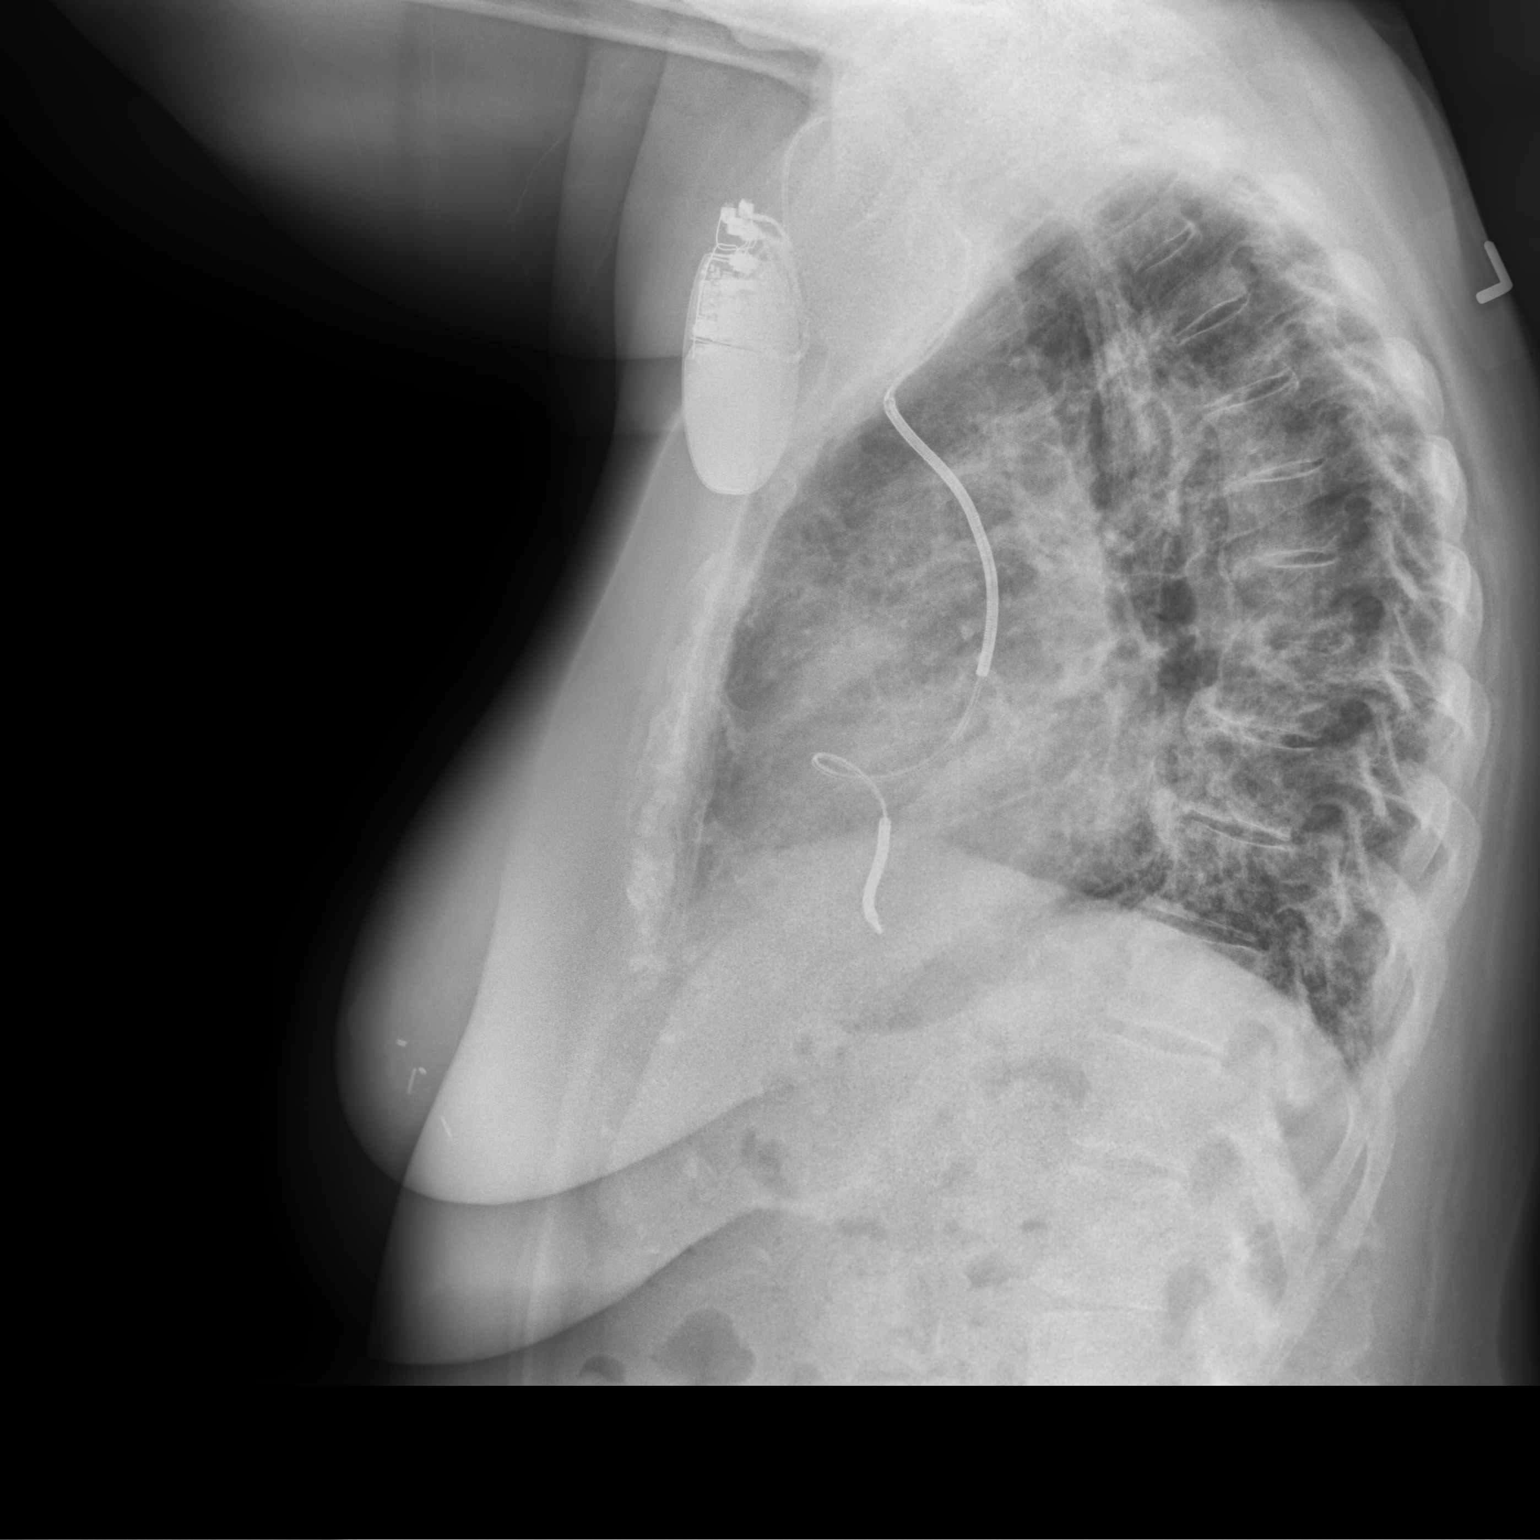

[2 of 2 positions shown; findings below may reference images not displayed]

FINDINGS: LEFT-sided pacemaker overlies normal cardiac silhouette. There is
patchy bilateral airspace densities slightly worsened from
comparison exam. No pneumothorax. No pleural fluid
IMPRESSION: Patchy bilateral airspace disease slightly worsened from comparison
exam.

## 2022-09-04 ENCOUNTER — Encounter: Payer: Self-pay | Admitting: Family

## 2022-09-04 ENCOUNTER — Encounter: Payer: Self-pay | Admitting: Physical Therapy

## 2022-09-04 ENCOUNTER — Ambulatory Visit: Payer: Medicare Other | Admitting: Physical Therapy

## 2022-09-04 DIAGNOSIS — M6281 Muscle weakness (generalized): Secondary | ICD-10-CM

## 2022-09-04 DIAGNOSIS — M545 Low back pain, unspecified: Secondary | ICD-10-CM | POA: Diagnosis not present

## 2022-09-04 DIAGNOSIS — G8929 Other chronic pain: Secondary | ICD-10-CM

## 2022-09-04 DIAGNOSIS — M25552 Pain in left hip: Secondary | ICD-10-CM

## 2022-09-04 NOTE — Therapy (Signed)
OUTPATIENT PHYSICAL THERAPY TREATMENT NOTE   Patient Name: [name] MRN: [id number] DOB:1951-01-24, 72 y.o., female Today's Date: 09/04/2022  PCP: Camillia Herter, NP   REFERRING PROVIDER: Dorna Mai, MD  END OF SESSION:   PT End of Session - 09/04/22 1315     Visit Number 6    Number of Visits 13    Date for PT Re-Evaluation 09/21/22    Authorization Type MEDICARE PART A AND B    Progress Note Due on Visit 10    PT Start Time 1315    PT Stop Time 1400    PT Time Calculation (min) 45 min              Past Medical History:  Diagnosis Date   AICD (automatic cardioverter/defibrillator) present    Anxiety    Arthritis    Back pain    Breast cancer ([place]) 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   Bronchiectasis ([place])    Cerebral aneurysm, nonruptured    had a clip put in   CHF (congestive heart failure) (HCC)    Clostridium difficile infection    Depressive disorder, not elsewhere classified    Diverticulosis of colon (without mention of hemorrhage)    Esophageal reflux    Fatty liver    Fibromyalgia    Gastritis    GERD (gastroesophageal reflux disease)    GI bleed 2004   Glaucoma    Hiatal hernia    History of COVID-19 05/04/2021   Hyperlipidemia    Hypertension    Hypothyroidism    Internal hemorrhoids    Joint pain    Multifocal pneumonia 06/03/2021   Obstructive sleep apnea (adult) (pediatric)    Osteoarthritis    Ostium secundum type atrial septal defect    Other chronic nonalcoholic liver disease    Other pulmonary embolism and infarction    Palpitations    Paroxysmal ventricular tachycardia (HCC)    Personal history of radiation therapy    PONV (postoperative nausea and vomiting)    Presence of permanent cardiac pacemaker    PUD (peptic ulcer disease)    Radiation 02/03/15-03/10/15   Right Breast   Sarcoid    per pt , not sure   Schatzki's ring    Shortness of breath    Sleep apnea    Stroke [facility] 2013   tia/ pt feels it  was around 2008 0r 2009   Takotsubo syndrome    Tubular adenoma of colon    Unspecified transient cerebral ischemia    Unspecified vitamin D deficiency    Past Surgical History:  Procedure Laterality Date   ABI  2006   normal   BREAST EXCISIONAL BIOPSY     BREAST LUMPECTOMY Right 2016   BREAST LUMPECTOMY WITH RADIOACTIVE SEED LOCALIZATION Right 01/03/2015   Procedure: BREAST LUMPECTOMY WITH RADIOACTIVE SEED LOCALIZATION;  Surgeon: Autumn Messing III, MD;  Location: [place];  Service: General;  Laterality: Right;   CARDIAC DEFIBRILLATOR PLACEMENT  2006; 2012   BSX single chamber ICD   carotid dopplers  2006   neg   CEREBRAL ANEURYSM REPAIR  02/1999   COLONOSCOPY     HAMMER TOE SURGERY  11/15/2020   3rd digit bilateral feet    hospitalization  2004   GI bleed, PUD, diverticulosis (EGD,colonscopy)   hospitalization     PE, NSVT, s/p defib   KNEE ARTHROSCOPY     bilateral   LEFT HEART CATHETERIZATION WITH CORONARY ANGIOGRAM N/A 02/22/2012  Procedure: LEFT HEART CATHETERIZATION WITH CORONARY ANGIOGRAM;  Surgeon: Burnell Blanks, MD;  Location: [facility] CATH LAB;  Service: Cardiovascular;  Laterality: N/A;   PARTIAL HYSTERECTOMY     Fibroids   TONGUE BIOPSY  12/12/2017   due to sore tongue and white patches/abnormal cells   TOTAL KNEE ARTHROPLASTY Left 02/13/2016   Procedure: TOTAL KNEE ARTHROPLASTY;  Surgeon: Vickey Huger, MD;  Location: [place];  Service: Orthopedics;  Laterality: Left;   TOTAL KNEE ARTHROPLASTY Right 05/22/2021   Procedure: TOTAL KNEE ARTHROPLASTY;  Surgeon: Vickey Huger, MD;  Location: [facility];  Service: Orthopedics;  Laterality: Right;  with block   UPPER GASTROINTESTINAL ENDOSCOPY     Patient Active Problem List   Diagnosis Date Noted   Partial symptomatic epilepsy with complex partial seizures, not intractable, without status epilepticus ([place]) 08/10/2022   Low back pain 08/10/2022   Gait abnormality 05/04/2022   Muscle spasm 05/04/2022   Upper airway cough  syndrome 01/19/2022   Chronic left shoulder pain 11/13/2021   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   Localized swelling of right lower leg 08/29/2021   Tonsillar exudate 08/29/2021   Candida vaginitis 08/28/2021   Chronic pain syndrome 08/25/2021   Multifocal pneumonia 06/03/2021   Pre-operative respiratory examination 05/18/2021   Statin myopathy 04/19/2021   Pain in joint of right shoulder 02/10/2021   Arthritis of both acromioclavicular joints 01/02/2021   Trochanteric bursitis of right hip 10/03/2020   Tailbone injury 05/05/2020   UTI (urinary tract infection) 05/05/2020   Insomnia 05/05/2020   Leg cramps 02/16/2020   Dysuria 02/16/2020   Right hip impingement syndrome 02/01/2020   Right bicipital tenosynovitis 02/01/2020   Bronchiectasis without complication ([place]) 76/54/6503   Myofascial pain 12/21/2019   Estrogen deficiency 12/10/2019   Obesity (BMI 30-39.9) 12/10/2019   Bronchiectasis with (acute) exacerbation (HCC) 10/23/2019   Elevated LFTs 10/15/2019   Lower abdominal pain 09/01/2019   Neck pain 10/25/2017   Paresthesias in left hand 10/25/2017   Paresthesia of foot, bilateral 04/12/2017   Primary osteoarthritis of both hands 09/27/2016   Primary osteoarthritis of both knees 09/27/2016   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   Nasopharyngitis, 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/2016   S/P total knee replacement 02/13/2016   Chronic pain of both knees 12/06/2015   Globus pharyngeus 05/17/2015   Genetic testing 12/23/2014   Family history of breast cancer    Family history of colon cancer    Family history of pancreatic cancer    History of breast cancer 11/23/2014   Allergy to ACE inhibitors 09/27/2014   Prediabetes 06/23/2013   Muscle spasms of both lower extremities 01/15/2013   Hx of Clostridium difficile infection 10/10/2012   Dyspnea on exertion 04/02/2012   NICM (nonischemic cardiomyopathy) ([place]) 03/07/2012   Post-menopausal 01/11/2012   Atypical chest pain 11/07/2011    Obstructive sleep apnea 04/04/2010   V-tach ([place]) 01/03/2009   ICD  [employer]  Single chamber 01/03/2009   PFO (patent foramen ovale) 09/29/2008   Vitamin D deficiency 08/17/2008   Hypothyroidism 11/18/2006   Hyperlipidemia 11/18/2006   Depression with anxiety 11/18/2006   Essential hypertension 11/18/2006   Mitral valve prolapse 11/18/2006   Cerebral aneurysm 11/18/2006   Allergic rhinitis 11/18/2006   GERD 11/18/2006   Diverticulosis of colon 11/18/2006   Fatty liver 11/18/2006   Fibromyalgia 11/18/2006    REFERRING DIAG: M54.50 (ICD-10-CM) - Left-sided low back pain without sciatica, unspecified chronicity; M25.552 (ICD-10-CM) - Left hip pain  THERAPY DIAG:  Chronic bilateral low back pain without sciatica  Muscle weakness (generalized)  Pain in left hip  Rationale for Evaluation and Treatment Rehabilitation  PERTINENT HISTORY:  Automatic cardioverter/defibrillator; Bilat knee replacements; arthritis; fibromyalgia; depression, High BMI  PRECAUTIONS: None  SUBJECTIVE:                                                                                                                                                                                      SUBJECTIVE STATEMENT: I am doing okay , certain movements like getting in and out of car, stand up and sit down still hurt. Turning to the right causes a pain on left hip. We have not treated my shoulder yet. I am interested in dry needling.   PAIN:  Are you having pain? Yes: NPRS scale: 2.5/10 L hip Pain location: L lowback, gluteal, lat hip, groin, and ant thigh Pain description: ache, sharp occasionally, constant Aggravating factors: Walking, sleeping, in/out of car, bending over Relieving factors: Muscle relaxors, heat pad, tylenol On Eval pain range=6-8/10   OBJECTIVE: (objective measures completed at initial evaluation unless otherwise dated)   Pt reports xrays were taken with [facility] yesterday. He was able to  recall there was no fracture, proper spacing for the joint   PATIENT SURVEYS:  FOTO: Perceived function   38%, predicted   55%  09/04/22: 54%    SCREENING FOR RED FLAGS: Bowel or bladder incontinence: No Spinal tumors: No Compression fracture: No   COGNITION: Overall cognitive status: Within functional limits for tasks assessed                          SENSATION: WFL   MUSCLE LENGTH: Hamstrings: Right NT deg; Left NT deg Marcello Moores test: Right NT deg; Left NT deg   POSTURE: increased lumbar lordosis   PALPATION: TTP with increased muscle tightness to the L gluteal region   LUMBAR ROM:    AROM eval  Flexion Mod limited, low back pulling pain  Extension Mod limited, pain low back and L hip  Right lateral flexion Mod limited, pulling pain R hip  Left lateral flexion Mod limited, pain across low back  Right rotation Mod limited, pulling pain R hip and across low back  Left rotation Mod limited, pain across low back   (Blank rows = not tested)   LOWER EXTREMITY ROM:     L decreased in comparison to R  due to pain Passive  Right eval Left eval  Hip flexion   P  Hip extension   P  Hip abduction   P  Hip adduction      Hip internal rotation   P  Hip external rotation  P  Knee flexion      Knee extension      Ankle dorsiflexion      Ankle plantarflexion      Ankle inversion      Ankle eversion      P=pain  (Blank rows = not tested)   LOWER EXTREMITY MMT:   L LE strength is deceased in comparison to R due to Pain MMT Right eval Left eval  Hip flexion   P  Hip extension   P  Hip abduction   P  Hip adduction   P  Hip internal rotation   P  Hip external rotation   P  Knee flexion      Knee extension      Ankle dorsiflexion      Ankle plantarflexion      Ankle inversion      Ankle eversion      P=pain  (Blank rows = not tested)   LUMBAR SPECIAL TESTS:  Straight leg raise test: Negative, Slump test: Negative, and SI Compression/distraction test: Negative           FADER L hip positive   FUNCTIONAL TESTS:  5 times sit to stand: 08/17/22: 40.8 sec (low mat, with UE) 2 minute walk test: 08/17/22: 185 Feet    GAIT: Distance walked: 175f Assistive device utilized: Single point cane Level of assistance: Complete Independence Comments: decreased pace   TODAY'S TREATMENT: [facility] Adult PT Treatment:                                                DATE: 09/04/22 Therapeutic Exercise: Nustep L5 UE/LE x 6 minutes Standing hip flexion 10 x 1 Standing hip abduction 10 x 1  Standing hip extension 10 x 1  Mini squat at counter STS x 10 from elevated mat  Seated lumbar flexion with green ball , added laterals Seated upper trap stretch Seated levator stretch Scap squeezes    [facility]                                                [facility]: 08[facility]28/22 Therapeutic Exercise: Nustep L4 UE/LE x 5 minutes  Standing 2 way hip with UE support x 10 each  Seated h/s stretch EOM Seated lumbar flexion Supine PPT to bridge x10  SKTC with opp leg extended Blue band clam 10 x 2 supine Hip adduction squeeze with ab draw in  Piriformis stretch LTR    [facility] Adult PT Treatment:                                                DATE: 08/24/22 Therapeutic Exercise: Supine Lower Trunk Rotation  - 1-2 x daily - 7 x weekly - 1 sets - 5 reps - 5 hold Supine Piriformis Stretch 2 reps - 15 hold Hooklying Single Knee to Chest  2 reps - 15 hold Supine hip add sets c ball 15 reps- 3 hold Supine Bridge 15 reps - 3 hold Hooklying Clamshell BluTB 15 reps Supine posterior pelvic tilt x15 3 sec Manual Therapy: LAD for the  L LE, pt reported L hip felt better with decreased pressure Gait Training: Gait training with a SPC in R hand for her L hip pain Modalities: Cold pack to the L lateral hip 15 mins Self Care: Use of cold packs periodically at home for symptom management  [facility] Adult PT Treatment:                                                DATE:  08/22/22 Therapeutic Exercise: Supine Lower Trunk Rotation  - 1-2 x daily - 7 x weekly - 1 sets - 5 reps - 5 hold Supine Piriformis Stretch 2 reps - 15 hold Hooklying Single Knee to Chest  2 reps - 15 hold Supine bridging c hip add sets c ball 15 reps- 3 hold Supine Bridge 15 reps - 3 hold Hooklying Clamshell BluTB 15 reps Manual Therapy: STM/DTM to the L piriformis and gluteal muscles Skilled palpation for TrPs and taut muscle bands for L piriformis and gluteal muscles Self Care: Sleeping positions with support to minimize L hip pressure and strain Trigger Point Dry Needling Treatment: Pre-treatment instruction: Patient instructed on dry needling rationale, procedures, and possible side effects including pain during treatment (achy,cramping feeling), bruising, drop of blood, lightheadedness, nausea, sweating. Patient Consent Given: Yes Education handout provided: Yes Muscles treated: L prirformis, glut minimus, medius and maximus  Needle size and number: .30x168m x 1 Electrical stimulation performed: No Parameters: N/A Treatment response/outcome: Twitch response elicited Post-treatment instructions: Patient instructed to expect possible mild to moderate muscle soreness later today and/or tomorrow. Patient instructed in methods to reduce muscle soreness and to continue prescribed HEP. If patient was dry needled over the lung field, patient was instructed on signs and symptoms of pneumothorax and, however unlikely, to see immediate medical attention should they occur. Patient was also educated on signs and symptoms of infection and to seek medical attention should they occur. Patient verbalized understanding of these instructions and education.   [facility]                                                [facility]: [facility] Therapeutic Exercise: Review of HEP STS from elevated Mat x 5 - needs UE- slow and guarded Seated Lumbar flexion with hands on thights Seated H/S stretch EOM x  2 - UE on thigh for support Seated marching Seated Trunk AROM rotation and side bending.   Therapeutic Activity: 2 MWT 5 x STS  Sit to supine transfers                                                                                                                           [facility]  [facility]: 08[facility]22 Therapeutic Exercise: Developed, instructed in, and pt completed therex as noted in HEP Self Care: Heating pad for pain management   PATIENT EDUCATION:  Education details: Eval findings, POC, HEP, self care Person educated: Patient, Parent, Spouse, and Child(ren) Education method: Explanation, Demonstration, Tactile cues, Verbal cues, and Handouts Education comprehension: verbalized understanding, returned demonstration, verbal cues required, and tactile cues required   HOME EXERCISE PROGRAM: Access Code: [email or web address] URL: [email or web address] Date: 08/22/2022 Prepared by: Gar Ponto  Exercises - Supine Lower Trunk Rotation  - 1-2 x daily - 7 x weekly - 1 sets - 5 reps - 5 hold - Supine Piriformis Stretch with Foot on Ground  - 1-2 x daily - 7 x weekly - 3 reps - 15 hold - Hooklying Single Knee to Chest  - 1-2 x daily - 7 x weekly - 1 sets - 3 reps - 15 hold - Supine Bridge  - 1-2 x daily - 7 x weekly - 1 sets - 10 reps - 3 hold - Hooklying Clamshell with Resistance  - 1 x daily - 7 x weekly - 1 sets - 10 reps - 3 hold - Supine Hip Adduction Isometric with Ball  - 1 x daily - 7 x weekly - 1 sets - 10 reps - 3 hold   ASSESSMENT:   CLINICAL IMPRESSION:.  Pt reports overall improvement, rating pain at 2.5/10 for hip, back, and 0/10 shoulder today. She does report some difficulty with turning her head to drive due to left shoulder and upper trap pain. She is interested in [facility] as a treatment. Will plan to assess UE/cervical and next land visit with PT.Continued with more closed chain focus as patient continues to  report improvement in overall condition. Began STS in elevated position and pt able to perform without UE.  Will continue PT to address pain and strength for improved function of the L LE with less pain.   OBJECTIVE IMPAIRMENTS: decreased activity tolerance, decreased balance, difficulty walking, decreased ROM, decreased strength, increased muscle spasms, obesity, and pain.    ACTIVITY LIMITATIONS: carrying, lifting, bending, sitting, standing, squatting, sleeping, stairs, and locomotion level   PARTICIPATION LIMITATIONS: meal prep, cleaning, laundry, shopping, and community activity   PERSONAL FACTORS: Fitness, Past/current experiences, Time since onset of injury/illness/exacerbation, and 3+ comorbidities:    Mod limited, pulling pain R hipare also affecting patient's functional outcome.    REHAB POTENTIAL: Good   CLINICAL DECISION MAKING: Evolving/moderate complexity   EVALUATION COMPLEXITY: Moderate     GOALS:   SHORT TERM GOALS: Target date: 08/24/22   Pt will be Ind in an initial HEP  Baseline: initiated Status: proper completion Goal status: MET   2.  Pt will voice understanding of measures to assist in pain reduction Baseline: initiated Status: HEP and sleeping positions with support; use of cold pack Goal status: MET   LONG TERM GOALS: Target date: 09/21/22   Pt will be Ind in a final HEP to maintain achieved LOF Baseline: initiated Goal status: INITIAL   2.  Pt's trunk ROM will improve to minimal or less for improved function and as demonstration of decreased pain Baseline: see flow sheets Goal status: INITIAL   3.  Pt will report a decreased in pain to 3/10 or less with daily activities Baseline: 6-8/10 Goal status: INITIAL   4.  Pt L hip will demonstrate ROM and strength equal to that of the R hip for improved function Baseline:  Goal status: INITIAL   5. Improve 5xSTS by  MCID of 5" and 2MWT by MCID of 32f as indication of improved functional mobility   Baseline: TBA Goal status: INITIAL   6.  Pt's FOTO score will improved to the predicted value of 55% as indication of improved function  Baseline: 38% 09/04/22: 54% Goal status: INITIAL   PLAN:   PT FREQUENCY: 2x/week   PT DURATION: 6 weeks   PLANNED INTERVENTIONS: Therapeutic exercises, Therapeutic activity, Neuromuscular re-education, Balance training, Gait training, Patient/Family education, Self Care, Joint mobilization, Stair training, Aquatic Therapy, Dry Needling, Electrical stimulation, Spinal manipulation, Spinal mobilization, Cryotherapy, Moist heat, Taping, Traction, Ultrasound, Ionotophoresis '4mg'$ /ml Dexamethasone, Manual therapy, and Re-evaluation.   PLAN FOR NEXT SESSION: FOTO status ; assess response to HEP; progress therex as indicated; use of modalities, manual therapy; and TPDN as indicated. [facility]she was given info sheet.  Pt has pacemaker, NO IONTO or ESTIM   Ask patient about hot tub -she can use draw bridge hot tub/whirlpool as long as she either used one before and know that it won't negatively affect her or is cleared from their MD to use one.   JHessie Diener PTA 09/04/22 2:03 PM Phone: [phone]: [phone]

## 2022-09-05 ENCOUNTER — Other Ambulatory Visit: Payer: Self-pay | Admitting: Family

## 2022-09-05 ENCOUNTER — Telehealth: Payer: Self-pay | Admitting: Family

## 2022-09-05 DIAGNOSIS — N63 Unspecified lump in unspecified breast: Secondary | ICD-10-CM

## 2022-09-05 DIAGNOSIS — Z853 Personal history of malignant neoplasm of breast: Secondary | ICD-10-CM

## 2022-09-05 NOTE — Addendum Note (Signed)
Addended by: Stephani Police on: 09/05/2022 08:35 AM   Modules accepted: Orders

## 2022-09-05 NOTE — Telephone Encounter (Signed)
New order complete.

## 2022-09-05 NOTE — Telephone Encounter (Signed)
Please make patient aware that I have notified our referral coordinator, Maren Reamer, to see if we have any updates on referral to [facility] ordered on 08/07/2022.

## 2022-09-06 ENCOUNTER — Encounter: Payer: Self-pay | Admitting: Physical Therapy

## 2022-09-06 ENCOUNTER — Ambulatory Visit: Payer: Medicare Other | Admitting: Physical Therapy

## 2022-09-06 ENCOUNTER — Encounter: Payer: Medicare Other | Admitting: Physical Therapy

## 2022-09-06 DIAGNOSIS — M6281 Muscle weakness (generalized): Secondary | ICD-10-CM

## 2022-09-06 DIAGNOSIS — M545 Low back pain, unspecified: Secondary | ICD-10-CM | POA: Diagnosis not present

## 2022-09-06 DIAGNOSIS — M25552 Pain in left hip: Secondary | ICD-10-CM

## 2022-09-06 DIAGNOSIS — R252 Cramp and spasm: Secondary | ICD-10-CM

## 2022-09-06 NOTE — Therapy (Signed)
OUTPATIENT PHYSICAL THERAPY TREATMENT NOTE   Patient Name: [name] MRN: [id number] DOB:[birth year], 72 y.o., female Today's Date: 09/06/2022  PCP: Camillia Herter, NP   REFERRING PROVIDER: Dorna Mai, MD  END OF SESSION:   PT End of Session - 09/06/22 1353     Visit Number 7    Number of Visits 13    Date for PT Re-Evaluation 09/21/22    Authorization Type MEDICARE PART A AND B    Progress Note Due on Visit 10    PT Start Time 1355    PT Stop Time 1440    PT Time Calculation (min) 45 min               Past Medical History:  Diagnosis Date   AICD (automatic cardioverter/defibrillator) present    Anxiety    Arthritis    Back pain    Breast cancer ([place]) 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   Bronchiectasis ([place])    Cerebral aneurysm, nonruptured    had a clip put in   CHF (congestive heart failure) (HCC)    Clostridium difficile infection    Depressive disorder, not elsewhere classified    Diverticulosis of colon (without mention of hemorrhage)    Esophageal reflux    Fatty liver    Fibromyalgia    Gastritis    GERD (gastroesophageal reflux disease)    GI bleed 2004   Glaucoma    Hiatal hernia    History of COVID-19 05/04/2021   Hyperlipidemia    Hypertension    Hypothyroidism    Internal hemorrhoids    Joint pain    Multifocal pneumonia 06/03/2021   Obstructive sleep apnea (adult) (pediatric)    Osteoarthritis    Ostium secundum type atrial septal defect    Other chronic nonalcoholic liver disease    Other pulmonary embolism and infarction    Palpitations    Paroxysmal ventricular tachycardia (HCC)    Personal history of radiation therapy    PONV (postoperative nausea and vomiting)    Presence of permanent cardiac pacemaker    PUD (peptic ulcer disease)    Radiation 02/03/15-03/10/15   Right Breast   Sarcoid    per pt , not sure   Schatzki's ring    Shortness of breath    Sleep apnea    Stroke [facility]) 2013   tia/ pt feels  it was around 2008 0r 2009   Takotsubo syndrome    Tubular adenoma of colon    Unspecified transient cerebral ischemia    Unspecified vitamin D deficiency    Past Surgical History:  Procedure Laterality Date   ABI  2006   normal   BREAST EXCISIONAL BIOPSY     BREAST LUMPECTOMY Right 2016   BREAST LUMPECTOMY WITH RADIOACTIVE SEED LOCALIZATION Right 01/03/2015   Procedure: BREAST LUMPECTOMY WITH RADIOACTIVE SEED LOCALIZATION;  Surgeon: Autumn Messing III, MD;  Location: [facility];  Service: General;  Laterality: Right;   CARDIAC DEFIBRILLATOR PLACEMENT  2006; 2012   BSX single chamber ICD   carotid dopplers  2006   neg   CEREBRAL ANEURYSM REPAIR  02/1999   COLONOSCOPY     HAMMER TOE SURGERY  11/15/2020   3rd digit bilateral feet    hospitalization  2004   GI bleed, PUD, diverticulosis (EGD,colonscopy)   hospitalization     PE, NSVT, s/p defib   KNEE ARTHROSCOPY     bilateral   LEFT HEART CATHETERIZATION WITH CORONARY ANGIOGRAM N/A 02/22/2012  Procedure: LEFT HEART CATHETERIZATION WITH CORONARY ANGIOGRAM;  Surgeon: Burnell Blanks, MD;  Location: [facility] CATH [facility];  Service: Cardiovascular;  Laterality: N/A;   PARTIAL HYSTERECTOMY     Fibroids   TONGUE BIOPSY  12/12/2017   due to sore tongue and white patches/abnormal cells   TOTAL KNEE ARTHROPLASTY Left 02/13/2016   Procedure: TOTAL KNEE ARTHROPLASTY;  Surgeon: Vickey Huger, MD;  Location: [place];  Service: Orthopedics;  Laterality: Left;   TOTAL KNEE ARTHROPLASTY Right 05/22/2021   Procedure: TOTAL KNEE ARTHROPLASTY;  Surgeon: Vickey Huger, MD;  Location: [facility];  Service: Orthopedics;  Laterality: Right;  with block   UPPER GASTROINTESTINAL ENDOSCOPY     Patient Active Problem List   Diagnosis Date Noted   Partial symptomatic epilepsy with complex partial seizures, not intractable, without status epilepticus ([place]) 08/10/2022   Low back pain 08/10/2022   Gait abnormality 05/04/2022   Muscle spasm 05/04/2022   Upper airway cough  syndrome 01/19/2022   Chronic left shoulder pain 11/13/2021   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   Localized swelling of right lower leg 08/29/2021   Tonsillar exudate 08/29/2021   Candida vaginitis 08/28/2021   Chronic pain syndrome 08/25/2021   Multifocal pneumonia 06/03/2021   Pre-operative respiratory examination 05/18/2021   Statin myopathy 04/19/2021   Pain in joint of right shoulder 02/10/2021   Arthritis of both acromioclavicular joints 01/02/2021   Trochanteric bursitis of right hip 10/03/2020   Tailbone injury 05/05/2020   UTI (urinary tract infection) 05/05/2020   Insomnia 05/05/2020   Leg cramps 02/16/2020   Dysuria 02/16/2020   Right hip impingement syndrome 02/01/2020   Right bicipital tenosynovitis 02/01/2020   Bronchiectasis without complication ([place]) 40/98/1191   Myofascial pain 12/21/2019   Estrogen deficiency 12/10/2019   Obesity (BMI 30-39.9) 12/10/2019   Bronchiectasis with (acute) exacerbation (HCC) 10/23/2019   Elevated LFTs 10/15/2019   Lower abdominal pain 09/01/2019   Neck pain 10/25/2017   Paresthesias in left hand 10/25/2017   Paresthesia of foot, bilateral 04/12/2017   Primary osteoarthritis of both hands 09/27/2016   Primary osteoarthritis of both knees 09/27/2016   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   Nasopharyngitis, 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/2016   S/P total knee replacement 02/13/2016   Chronic pain of both knees 12/06/2015   Globus pharyngeus 05/17/2015   Genetic testing 12/23/2014   Family history of breast cancer    Family history of colon cancer    Family history of pancreatic cancer    History of breast cancer 11/23/2014   Allergy to ACE inhibitors 09/27/2014   Prediabetes 06/23/2013   Muscle spasms of both lower extremities 01/15/2013   Hx of Clostridium difficile infection 10/10/2012   Dyspnea on exertion 04/02/2012   NICM (nonischemic cardiomyopathy) ([place]) 03/07/2012   Post-menopausal 01/11/2012   Atypical chest pain 11/07/2011    Obstructive sleep apnea 04/04/2010   V-tach ([place]) 01/03/2009   ICD  [employer]  Single chamber 01/03/2009   PFO (patent foramen ovale) 09/29/2008   Vitamin D deficiency 08/17/2008   Hypothyroidism 11/18/2006   Hyperlipidemia 11/18/2006   Depression with anxiety 11/18/2006   Essential hypertension 11/18/2006   Mitral valve prolapse 11/18/2006   Cerebral aneurysm 11/18/2006   Allergic rhinitis 11/18/2006   GERD 11/18/2006   Diverticulosis of colon 11/18/2006   Fatty liver 11/18/2006   Fibromyalgia 11/18/2006    REFERRING DIAG: M54.50 (ICD-10-CM) - Left-sided low back pain without sciatica, unspecified chronicity; M25.552 (ICD-10-CM) - Left hip pain  THERAPY DIAG:  Chronic bilateral low back pain without sciatica  Muscle weakness (generalized)  Pain in left hip  Cramp and spasm  Rationale for Evaluation and Treatment Rehabilitation  PERTINENT HISTORY:  Automatic cardioverter/defibrillator; Bilat knee replacements; arthritis; fibromyalgia; depression, High BMI  PRECAUTIONS: None  SUBJECTIVE:                                                                                                                                                                                      SUBJECTIVE STATEMENT: Pt states she is having some L hip and L shoulder pain.  She rates the pain @ 4/10  PAIN:  Are you having pain? Yes: NPRS scale: 4/10 L hip Pain location: L lowback, gluteal, lat hip, groin, and ant thigh Pain description: ache, sharp occasionally, constant Aggravating factors: Walking, sleeping, in/out of car, bending over Relieving factors: Muscle relaxors, heat pad, tylenol On Eval pain range=6-8/10   OBJECTIVE: (objective measures completed at initial evaluation unless otherwise dated)   Pt reports xrays were taken with [facility] yesterday. He was able to recall there was no fracture, proper spacing for the joint   PATIENT SURVEYS:  FOTO: Perceived function   38%,  predicted   55%  09/04/22: 54%    SCREENING FOR RED FLAGS: Bowel or bladder incontinence: No Spinal tumors: No Compression fracture: No   COGNITION: Overall cognitive status: Within functional limits for tasks assessed                          SENSATION: WFL   MUSCLE LENGTH: Hamstrings: Right NT deg; Left NT deg Marcello Moores test: Right NT deg; Left NT deg   POSTURE: increased lumbar lordosis   PALPATION: TTP with increased muscle tightness to the L gluteal region   LUMBAR ROM:    AROM eval  Flexion Mod limited, low back pulling pain  Extension Mod limited, pain low back and L hip  Right lateral flexion Mod limited, pulling pain R hip  Left lateral flexion Mod limited, pain across low back  Right rotation Mod limited, pulling pain R hip and across low back  Left rotation Mod limited, pain across low back   (Blank rows = not tested)   LOWER EXTREMITY ROM:     L decreased in comparison to R  due to pain Passive  Right eval Left eval  Hip flexion   P  Hip extension   P  Hip abduction   P  Hip adduction      Hip internal rotation   P  Hip external rotation   P  Knee flexion      Knee extension      Ankle dorsiflexion  Ankle plantarflexion      Ankle inversion      Ankle eversion      P=pain  (Blank rows = not tested)   LOWER EXTREMITY MMT:   L LE strength is deceased in comparison to R due to Pain MMT Right eval Left eval  Hip flexion   P  Hip extension   P  Hip abduction   P  Hip adduction   P  Hip internal rotation   P  Hip external rotation   P  Knee flexion      Knee extension      Ankle dorsiflexion      Ankle plantarflexion      Ankle inversion      Ankle eversion      P=pain  (Blank rows = not tested)   LUMBAR SPECIAL TESTS:  Straight leg raise test: Negative, Slump test: Negative, and SI Compression/distraction test: Negative          FADER L hip positive   FUNCTIONAL TESTS:  5 times sit to stand: 08/17/22: 40.8 sec (low mat, with UE) 2  minute walk test: 08/17/22: 185 Feet    GAIT: Distance walked: 142f Assistive device utilized: Single point cane Level of assistance: Complete Independence Comments: decreased pace   TODAY'S TREATMENT:  TREATMENT 09/06/21:  [address] therapeutic pool temp 92 degrees Pt enters building independently.  Treatment took place in water 3.8 to  4 ft 8 in.feet deep depending upon activity.  Pt entered and exited the pool via stair and handrails    Aquatic Therapy:  Water walking for warm up fwd/lat/bkwds  Standing: Walking march Hamstring curl x20 BIL Hip abd/add x20 BIL Hip Circles CC/CCW 2x10 each BIL Heel/toes raises - x20 Hip ext/flex with knee straight x 20 BIL Yellow noodle stomp x20 ea Squats x20 Lunge fwd x20 Lunge lateral x20 Step ups on submerged step 2x10 BIL  Pt requires the buoyancy of water for active assisted exercises with buoyancy supported for strengthening and AROM exercises. Hydrostatic pressure also supports joints by unweighting joint load by at least 50 % in 3-4 feet depth water. 80% in chest to neck deep water. Water will provide assistance with movement using the current and laminar flow while the buoyancy reduces weight bearing. Pt requires the viscosity of the water for resistance with strengthening exercises.    [facility]                                                DATE: 09/04/22 Therapeutic Exercise: Nustep L5 UE/LE x 6 minutes Standing hip flexion 10 x 1 Standing hip abduction 10 x 1  Standing hip extension 10 x 1  Mini squat at counter STS x 10 from elevated mat  Seated lumbar flexion with green ball , added laterals Seated upper trap stretch Seated levator stretch Scap squeezes    [facility]                                                DATE: 08/28/22 Therapeutic Exercise: Nustep L4 UE/LE x 5 minutes  Standing 2 way hip with UE support x 10 each  Seated h/s stretch  EOM Seated lumbar flexion Supine PPT to bridge x10  SKTC with opp leg extended Blue band clam 10 x 2 supine Hip adduction squeeze with ab draw in  Piriformis stretch LTR    [facility] Adult PT Treatment:                                                DATE: 08/24/22 Therapeutic Exercise: Supine Lower Trunk Rotation  - 1-2 x daily - 7 x weekly - 1 sets - 5 reps - 5 hold Supine Piriformis Stretch 2 reps - 15 hold Hooklying Single Knee to Chest  2 reps - 15 hold Supine hip add sets c ball 15 reps- 3 hold Supine Bridge 15 reps - 3 hold Hooklying Clamshell BluTB 15 reps Supine posterior pelvic tilt x15 3 sec Manual Therapy: LAD for the L LE, pt reported L hip felt better with decreased pressure Gait Training: Gait training with a SPC in R hand for her L hip pain Modalities: Cold pack to the L lateral hip 15 mins Self Care: Use of cold packs periodically at home for symptom management  [facility] Adult PT Treatment:                                                DATE: 08/22/22 Therapeutic Exercise: Supine Lower Trunk Rotation  - 1-2 x daily - 7 x weekly - 1 sets - 5 reps - 5 hold Supine Piriformis Stretch 2 reps - 15 hold Hooklying Single Knee to Chest  2 reps - 15 hold Supine bridging c hip add sets c ball 15 reps- 3 hold Supine Bridge 15 reps - 3 hold Hooklying Clamshell BluTB 15 reps Manual Therapy: STM/DTM to the L piriformis and gluteal muscles Skilled palpation for TrPs and taut muscle bands for L piriformis and gluteal muscles Self Care: Sleeping positions with support to minimize L hip pressure and strain Trigger Point Dry Needling Treatment: Pre-treatment instruction: Patient instructed on dry needling rationale, procedures, and possible side effects including pain during treatment (achy,cramping feeling), bruising, drop of blood, lightheadedness, nausea, sweating. Patient Consent Given: Yes Education handout provided: Yes Muscles treated: L prirformis, glut minimus, medius and  maximus  Needle size and number: .30x132m x 1 Electrical stimulation performed: No Parameters: N/A Treatment response/outcome: Twitch response elicited Post-treatment instructions: Patient instructed to expect possible mild to moderate muscle soreness later today and/or tomorrow. Patient instructed in methods to reduce muscle soreness and to continue prescribed HEP. If patient was dry needled over the lung field, patient was instructed on signs and symptoms of pneumothorax and, however unlikely, to see immediate medical attention should they occur. Patient was also educated on signs and symptoms of infection and to seek medical attention should they occur. Patient verbalized understanding of these instructions and education.   [facility]                                                DATE: 08/17/22 Therapeutic Exercise: Review of HEP STS from elevated Mat x 5 - needs UE- slow and guarded Seated Lumbar flexion with  hands on thights Seated H/S stretch EOM x 2 - UE on thigh for support Seated marching Seated Trunk AROM rotation and side bending.   Therapeutic Activity: 2 MWT 5 x STS  Sit to supine transfers                                                                                                                           [facility] Adult PT Treatment:                                                DATE: 08/03/22 Therapeutic Exercise: Developed, instructed in, and pt completed therex as noted in HEP Self Care: Heating pad for pain management   PATIENT EDUCATION:  Education details: Eval findings, POC, HEP, self care Person educated: Patient, Parent, Spouse, and Child(ren) Education method: Explanation, Demonstration, Tactile cues, Verbal cues, and Handouts Education comprehension: verbalized understanding, returned demonstration, verbal cues required, and tactile cues required   HOME EXERCISE PROGRAM: Access Code: [email or web address] URL: [email or web address] Date:  08/22/2022 Prepared by: Gar Ponto  Exercises - Supine Lower Trunk Rotation  - 1-2 x daily - 7 x weekly - 1 sets - 5 reps - 5 hold - Supine Piriformis Stretch with Foot on Ground  - 1-2 x daily - 7 x weekly - 3 reps - 15 hold - Hooklying Single Knee to Chest  - 1-2 x daily - 7 x weekly - 1 sets - 3 reps - 15 hold - Supine Bridge  - 1-2 x daily - 7 x weekly - 1 sets - 10 reps - 3 hold - Hooklying Clamshell with Resistance  - 1 x daily - 7 x weekly - 1 sets - 10 reps - 3 hold - Supine Hip Adduction Isometric with Ball  - 1 x daily - 7 x weekly - 1 sets - 10 reps - 3 hold   ASSESSMENT:   CLINICAL IMPRESSION:.  Session today focused on hip strengthening and ROM in the aquatic environment for use of buoyancy to offload joints and the viscosity of water as resistance during therapeutic exercise.  [place] was cued for form and pacing throughout.  She fatigues by end of session but is able to complete all exercises with minimal increase in pain.  Patient was able to tolerate all prescribed exercises in the aquatic environment with no adverse effects and reports 3/10 pain at the end of the session. Patient continues to benefit from skilled PT services on land and aquatic based and should be progressed as able to improve functional independence.    OBJECTIVE IMPAIRMENTS: decreased activity tolerance, decreased balance, difficulty walking, decreased ROM, decreased strength, increased muscle spasms, obesity, and pain.    ACTIVITY LIMITATIONS: carrying, lifting, bending, sitting, standing, squatting, sleeping, stairs, and locomotion level   PARTICIPATION LIMITATIONS: meal prep, cleaning, laundry, shopping, and community activity  PERSONAL FACTORS: Fitness, Past/current experiences, Time since onset of injury/illness/exacerbation, and 3+ comorbidities:    Mod limited, pulling pain R hipare also affecting patient's functional outcome.    REHAB POTENTIAL: Good   CLINICAL DECISION MAKING: Evolving/moderate  complexity   EVALUATION COMPLEXITY: Moderate     GOALS:   SHORT TERM GOALS: Target date: 08/24/22   Pt will be Ind in an initial HEP  Baseline: initiated Status: proper completion Goal status: MET   2.  Pt will voice understanding of measures to assist in pain reduction Baseline: initiated Status: HEP and sleeping positions with support; use of cold pack Goal status: MET   LONG TERM GOALS: Target date: 09/21/22   Pt will be Ind in a final HEP to maintain achieved LOF Baseline: initiated Goal status: INITIAL   2.  Pt's trunk ROM will improve to minimal or less for improved function and as demonstration of decreased pain Baseline: see flow sheets Goal status: INITIAL   3.  Pt will report a decreased in pain to 3/10 or less with daily activities Baseline: 6-8/10 Goal status: INITIAL   4.  Pt L hip will demonstrate ROM and strength equal to that of the R hip for improved function Baseline:  Goal status: INITIAL   5. Improve 5xSTS by MCID of 5" and 2MWT by MCID of 30f as indication of improved functional mobility  Baseline: TBA Goal status: INITIAL   6.  Pt's FOTO score will improved to the predicted value of 55% as indication of improved function  Baseline: 38% 09/04/22: 54% Goal status: INITIAL   PLAN:   PT FREQUENCY: 2x/week   PT DURATION: 6 weeks   PLANNED INTERVENTIONS: Therapeutic exercises, Therapeutic activity, Neuromuscular re-education, Balance training, Gait training, Patient/Family education, Self Care, Joint mobilization, Stair training, Aquatic Therapy, Dry Needling, Electrical stimulation, Spinal manipulation, Spinal mobilization, Cryotherapy, Moist heat, Taping, Traction, Ultrasound, Ionotophoresis '4mg'$ /ml Dexamethasone, Manual therapy, and Re-evaluation.   PLAN FOR NEXT SESSION: FOTO status ; assess response to HEP; progress therex as indicated; use of modalities, manual therapy; and TPDN as indicated. [facility]she was given info sheet.  Pt has pacemaker, NO IONTO or ESTIM   Ask patient about hot tub -she can use draw bridge hot tub/whirlpool as long as she either used one before and know that it won't negatively affect her or is cleared from their MD to use one.   KKevan NyReinhartsen PT 09/06/22 3:13 PM Phone: [phone]: [phone]

## 2022-09-07 ENCOUNTER — Ambulatory Visit: Payer: Medicare Other | Attending: Cardiology | Admitting: Student

## 2022-09-07 DIAGNOSIS — E782 Mixed hyperlipidemia: Secondary | ICD-10-CM

## 2022-09-07 NOTE — Patient Instructions (Signed)
Medication changes: We will start the process to get PCSK9i (Praluent or Repatha) covered by your insurance.  Once the prior authorization is complete, we will call you to let you know and confirm pharmacy information.      Praluent is a cholesterol medication that improved your body's ability to get rid of "bad cholesterol" known as LDL. It can lower your LDL up to 60%. It is an injection that is given under the skin every 2 weeks. The most common side effects of Praluent include runny nose, symptoms of the common cold, rarely flu or flu-like symptoms, back/muscle pain in about 3-4% of the patients, and redness, pain, or bruising at the injection site.    Repatha is a cholesterol medication that improved your body's ability to get rid of "bad cholesterol" known as LDL. It can lower your LDL up to 60%! It is an injection that is given under the skin every 2 weeks. The most common side effects of Repatha include runny nose, symptoms of the common cold, rarely flu or flu-like symptoms, back/muscle pain in about 3-4% of the patients, and redness, pain, or bruising at the injection site.   Lab orders: We want to repeat labs after 2-3 months.  We will send you a lab order to remind you once we get closer to that time.        Copay Assistance:  The [employer] foundation offers assistance to help pay for medication copays.  They will cover copays for all cholesterol lowering meds, including statins, fibrates, omega-3 oils, ezetimibe, Repatha, Praluent, Nexletol, Nexlizet.  The cards are usually good for $2,500 or 12 months, whichever comes first. Go to healthwellfoundation.org Click on "Apply Now" Answer questions as to whom is applying (patient or representative) Your disease fund will be "hypercholesterolemia - Medicare access" Select the cholesterol medication you need assistance with (Repatha, Praluent, Nexlizet...) They will ask question about qualifying diagnosis - you can mark "yes"; and do you  have insurance coverage.   When they ask what type of assistance you are interested in - "copay assistance" When you submit, the approval is usually within minutes.  You will need to print the card information from the site You will need to show this information to your pharmacy, they will bill your Medicare Part D plan first -then bill [employer] --for the copay.   You can also call them at [phone], although the hold times can be quite long.

## 2022-09-07 NOTE — Progress Notes (Signed)
Patient ID: [name]                 DOB: 12/13/1950                    MRN: [id number]      HPI: [name] is a 72 y.o. female patient referred to lipid clinic by Dr.Varanasi. PMH is significant for PFO, cerebral aneurysm, NICM,osteoarthritis, hx of TIA in 2016.  Patient presented today to discuss cholesterol medications over the years she had tried various statins including rosuvastatin and atorvastatin. She could not tolerate due to muscle aches some of statins had elevated CK. Nexlizet was cost prohibitive for her so she has been on ezetimibe but her recent lab shows elevated CK.  Diet: generally eats healthy   Exercise: none   Social History:  Alcohol: none Smoking: none Labs: Lipid Panel     Component Value Date/Time   CHOL 178 10/19/2021 0911   TRIG 100 10/19/2021 0911   HDL 51 10/19/2021 0911   CHOLHDL 3.5 10/19/2021 0911   CHOLHDL 3 12/20/2020 1045   VLDL 18.4 12/20/2020 1045   LDLCALC 109 (H) 10/19/2021 0911   LDLDIRECT 141.8 07/04/2007 0936   LABVLDL 18 10/19/2021 0911    Past Medical History:  Diagnosis Date   AICD (automatic cardioverter/defibrillator) present    Anxiety    Arthritis    Back pain    Breast cancer ([place]) 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   Bronchiectasis ([place])    Cerebral aneurysm, nonruptured    had a clip put in   CHF (congestive heart failure) (HCC)    Clostridium difficile infection    Depressive disorder, not elsewhere classified    Diverticulosis of colon (without mention of hemorrhage)    Esophageal reflux    Fatty liver    Fibromyalgia    Gastritis    GERD (gastroesophageal reflux disease)    GI bleed 2004   Glaucoma    Hiatal hernia    History of COVID-19 05/04/2021   Hyperlipidemia    Hypertension    Hypothyroidism    Internal hemorrhoids    Joint pain    Multifocal pneumonia 06/03/2021   Obstructive sleep apnea (adult) (pediatric)    Osteoarthritis    Ostium secundum type atrial septal  defect    Other chronic nonalcoholic liver disease    Other pulmonary embolism and infarction    Palpitations    Paroxysmal ventricular tachycardia (HCC)    Personal history of radiation therapy    PONV (postoperative nausea and vomiting)    Presence of permanent cardiac pacemaker    PUD (peptic ulcer disease)    Radiation 02/03/15-03/10/15   Right Breast   Sarcoid    per pt , not sure   Schatzki's ring    Shortness of breath    Sleep apnea    Stroke ([place]) 2013   tia/ pt feels it was around 2008 0r 2009   Takotsubo syndrome    Tubular adenoma of colon    Unspecified transient cerebral ischemia    Unspecified vitamin D deficiency     Current Outpatient Medications on File Prior to Visit  Medication Sig Dispense Refill   acetaminophen (TYLENOL) 500 MG tablet Take 500 mg by mouth every 6 (six) hours as needed for moderate pain.     albuterol (PROVENTIL) (2.5 MG/3ML) 0.083% nebulizer solution USE 1 VIAL IN NEBULIZER EVERY 6 HOURS (Patient taking differently: Take 2.5 mg by nebulization  every 6 (six) hours as needed for shortness of breath.) 120 mL 11   albuterol (VENTOLIN HFA) 108 (90 Base) MCG/ACT inhaler Inhale 2 puffs into the lungs every 6 (six) hours as needed for wheezing or shortness of breath. 18 g 3   Ascorbic Acid (VITAMIN C) 1000 MG tablet Take 1,000 mg by mouth daily.     aspirin EC 81 MG tablet Take 81 mg by mouth daily. Swallow whole.     azelastine (ASTELIN) 0.1 % nasal spray Place 1 spray into both nostrils 2 (two) times daily. Use in each nostril as directed 90 mL 3   baclofen (LIORESAL) 10 MG tablet Take 1 tablet (10 mg total) by mouth at bedtime as needed for muscle spasms. 90 tablet 1   budesonide-formoterol (SYMBICORT) 80-4.5 MCG/ACT inhaler Inhale 1 puff into the lungs 2 (two) times daily. 1 each 12   cefUROXime (CEFTIN) 500 MG tablet Take 1 tablet (500 mg total) by mouth 2 (two) times daily with a meal. 14 tablet 0   cetirizine (ZYRTEC) 10 MG tablet Take 10 mg by  mouth daily as needed for allergies.     Cholecalciferol (DIALYVITE VITAMIN D 5000) 125 MCG (5000 UT) capsule Take 5,000 Units by mouth daily.     cloBAZam (ONFI) 10 MG tablet Take 1 tablet (10 mg total) by mouth 2 (two) times daily. 60 tablet 5   diclofenac Sodium (VOLTAREN) 1 % GEL Apply 2-4 grams to affected joint 4 times daily as needed. (Patient taking differently: Apply 2-4 g topically 4 (four) times daily as needed (joint pain).) 400 g 2   diltiazem (CARDIZEM CD) 120 MG 24 hr capsule Take 1 capsule (120 mg total) by mouth daily. Do Not start this medication until after you wean off Carvedilol. 90 capsule 2   Empagliflozin (JARDIANCE PO) Take by mouth.     ezetimibe (ZETIA) 10 MG tablet TAKE 1 TABLET EVERY DAY 90 tablet 0   famotidine (PEPCID) 40 MG tablet Take 1 tablet (40 mg total) by mouth at bedtime. 360 tablet 6   fexofenadine (ALLEGRA) 180 MG tablet Take 180 mg by mouth daily as needed for allergies (alternates with zyrtec sometimes).     fluticasone (CUTIVATE) 0.05 % cream Apply topically 2 (two) times daily. 60 g 2   fluticasone (FLONASE) 50 MCG/ACT nasal spray Place 2 sprays into both nostrils daily as needed for allergies or rhinitis. 48 g 3   FOLIC ACID PO Take 1 tablet by mouth daily.     furosemide (LASIX) 20 MG tablet TAKE 2 TABLETS (40 MG) 3 DAYS PER WEEK AND TAKE 1 TABLET (20 MG) THE OTHER DAYS OF THE WEEK 130 tablet 1   gabapentin (NEURONTIN) 100 MG capsule Take 1 capsule (100 mg total) by mouth 3 (three) times daily. 90 capsule 6   gabapentin (NEURONTIN) 300 MG capsule Take 1 capsule (300 mg total) by mouth at bedtime. 30 capsule 2   hydroxypropyl methylcellulose / hypromellose (ISOPTO TEARS / GONIOVISC) 2.5 % ophthalmic solution Place 1 drop into both eyes at bedtime.     JARDIANCE 10 MG TABS tablet Take 10 mg by mouth daily.     latanoprost (XALATAN) 0.005 % ophthalmic solution Place 1 drop into both eyes at bedtime.     levETIRAcetam (KEPPRA) 500 MG tablet Take 1 tablet  (500 mg total) by mouth 2 (two) times daily. 180 tablet 1   levothyroxine (SYNTHROID) 50 MCG tablet TAKE 1 TABLET EVERY DAY 90 tablet 1   magnesium oxide (  MAG-OX) 400 MG tablet Take 1 tablet (400 mg total) by mouth 2 (two) times daily. 180 tablet 3   mometasone (ELOCON) 0.1 % lotion Apply 1 application topically daily as needed (rash). 60 mL 1   montelukast (SINGULAIR) 10 MG tablet TAKE 1 TABLET AT BEDTIME 90 tablet 3   Multiple Vitamins-Minerals (MULTIVITAMIN PO) Take 1 tablet by mouth daily.     nitroGLYCERIN (NITROSTAT) 0.4 MG SL tablet DISSOLVE ONE TABLET UNDER THE TONGUE EVERY 5 MINUTES AS NEEDED FOR CHEST PAIN.  DO NOT EXCEED A TOTAL OF 3 DOSES IN 15 MINUTES (Patient taking differently: Place 0.4 mg under the tongue every 5 (five) minutes as needed for chest pain.) 25 tablet 0   nystatin (MYCOSTATIN) 100000 UNIT/ML suspension Take 5 mLs (500,000 Units total) by mouth 4 (four) times daily. 60 mL 0   pantoprazole (PROTONIX) 40 MG tablet Take 1 tablet (40 mg total) by mouth daily. 90 tablet 3   polyethylene glycol powder (GLYCOLAX/MIRALAX) powder Take 17 grams by mouth daily 1080 g 0   potassium chloride (KLOR-CON M) 10 MEQ tablet TAKE 1/2 TABLET EVERY DAY (CALL OFFICE TO SCHEDULE YEARLY APPT) 30 tablet 11   Respiratory Therapy Supplies (FLUTTER) DEVI 1 Device by Does not apply route as needed. 1 each 0   RSV vaccine recomb adjuvanted (AREXVY) 120 MCG/0.5ML injection Inject into the muscle. 0.5 mL 0   sodium chloride HYPERTONIC 3 % nebulizer solution Take by nebulization 2 (two) times daily as needed for cough. 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 750 mL 12   traMADol (ULTRAM) 50 MG tablet Take 1 tablet (50 mg total) by mouth every 12 (twelve) hours as needed. 180 tablet 1   traZODone (DESYREL) 100 MG tablet TAKE 1 TO 1 AND 1/2 TABLETS AT BEDTIME 135 tablet 1   triamcinolone cream (KENALOG) 0.1 % Apply 1 Application topically 2 (two) times daily. 453.6 g 0   No current facility-administered medications on file  prior to visit.    Allergies  Allergen Reactions   Ace Inhibitors Swelling    Angioedema; makes tongue "break out"    Amitriptyline Hcl Other (See Comments)     makes her too sleepy!   Atorvastatin Other (See Comments)    SEVERE MYALGIA   Cymbalta [Duloxetine Hcl] Nausea Only and Other (See Comments)    Sleepiness/ sick   Dilantin [Phenytoin Sodium Extended] Rash    Severe rash   Paroxetine Nausea Only    Rapid heartbeat   Ramipril Other (See Comments)    TONGUE ULCERS    Rosuvastatin Other (See Comments)    SEVERE MYALGIA   Carbamazepine Rash   Codeine Itching   Paxlovid [Nirmatrelvir-Ritonavir] Itching   Phenytoin Sodium Extended Rash   Simvastatin Other (See Comments)    Increase in CK, myalgias   Wellbutrin [Bupropion] Palpitations    Problem  Hyperlipidemia    Current Medications: Zetia 10 mg  Intolerances: simvastatin 40 mg CK elevation, rosuvastatin and atorvastatin - muscle pain Risk Factors: hx TIA, cerebral aneurysm, PFO LDL goal: <55     Hyperlipidemia Assessment:  LDL goal: < 55 mg/dl last LDLc  109 mg/dl (10/2021) Intolerance to Atorvastatin and rosuvastatin - muscle pain simvastatin, ezetimibe elevated CK level Discussed next potential options (PCSK-9 inhibitors, bempedoic acid and inclisiran); cost, dosing efficacy, side effects  Patient is in agreement to start PCSK9i.   Plan:  Will apply for PA for PCSK9i; will inform patient upon approval (prefers MyChart message) Will enroll patient in grant to get help with co-pay  Lipid lab due in 2-3 months after starting PCSK9i  Thank you,  Cammy Copa, Pharm.D  [facility] [address] [place], [place] [address]  Phone: [phone]; Fax: [phone]

## 2022-09-07 NOTE — Assessment & Plan Note (Addendum)
Assessment:  LDL goal: < 55 mg/dl last LDLc  109 mg/dl (10/2021) Intolerance to Atorvastatin and rosuvastatin - muscle pain simvastatin, ezetimibe elevated CK level Discussed next potential options (PCSK-9 inhibitors, bempedoic acid and inclisiran); cost, dosing efficacy, side effects  Patient is in agreement to start PCSK9i.   Plan:  Will apply for PA for PCSK9i; will inform patient upon approval (prefers MyChart message) Will enroll patient in grant to get help with co-pay Lipid lab due in 2-3 months after starting [facility]

## 2022-09-08 ENCOUNTER — Other Ambulatory Visit: Payer: Self-pay | Admitting: Interventional Cardiology

## 2022-09-08 LAB — LIPID PANEL
Chol/HDL Ratio: 3 ratio (ref 0.0–4.4)
Cholesterol, Total: 252 mg/dL — ABNORMAL HIGH (ref 100–199)
HDL: 84 mg/dL (ref >39)
LDL Chol Calc (NIH): 155 mg/dL — ABNORMAL HIGH (ref 0–99)
Triglycerides: 77 mg/dL (ref 0–149)
VLDL Cholesterol Cal: 13 mg/dL (ref 5–40)

## 2022-09-10 ENCOUNTER — Telehealth: Payer: Self-pay

## 2022-09-10 ENCOUNTER — Encounter: Payer: Self-pay | Admitting: *Deleted

## 2022-09-10 ENCOUNTER — Other Ambulatory Visit (HOSPITAL_COMMUNITY): Payer: Self-pay

## 2022-09-10 ENCOUNTER — Telehealth: Payer: Self-pay | Admitting: Pharmacist

## 2022-09-10 DIAGNOSIS — E782 Mixed hyperlipidemia: Secondary | ICD-10-CM

## 2022-09-10 MED ORDER — REPATHA SURECLICK 140 MG/ML ~~LOC~~ SOAJ: 140.0000 mg | SUBCUTANEOUS | 3 refills | Status: DC

## 2022-09-10 NOTE — Telephone Encounter (Unsigned)
Copied from [place] [id number]. Topic: General - Other >> Sep 10, 2022 12:32 PM Cyndi Bender wrote: Reason for CRM: Pt stated her cardiologist wants her to have her thyroid checked. Pt stated she does not need an appt with the doctor she just needs a lab appt. Cb# [phone]

## 2022-09-10 NOTE — Therapy (Signed)
OUTPATIENT PHYSICAL THERAPY TREATMENT NOTE   Patient Name: [name] MRN: [id number] DOB:1951/01/02, 72 y.o., female Today's Date: 09/11/2022  PCP: Camillia Herter, NP   REFERRING PROVIDER: Dorna Mai, MD  END OF SESSION:   PT End of Session - 09/11/22 1338     Visit Number 8    Number of Visits 13    Date for PT Re-Evaluation 09/21/22    Authorization Type MEDICARE PART A AND B    Progress Note Due on Visit 10    PT Start Time 1331    PT Stop Time 7035    PT Time Calculation (min) 44 min    Activity Tolerance Patient tolerated treatment well    Behavior During Therapy WFL for tasks assessed/performed               Past Medical History:  Diagnosis Date   AICD (automatic cardioverter/defibrillator) present    Anxiety    Arthritis    Back pain    Breast cancer ([place]) 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   Bronchiectasis ([place])    Cerebral aneurysm, nonruptured    had a clip put in   CHF (congestive heart failure) (HCC)    Clostridium difficile infection    Depressive disorder, not elsewhere classified    Diverticulosis of colon (without mention of hemorrhage)    Esophageal reflux    Fatty liver    Fibromyalgia    Gastritis    GERD (gastroesophageal reflux disease)    GI bleed 2004   Glaucoma    Hiatal hernia    History of COVID-19 05/04/2021   Hyperlipidemia    Hypertension    Hypothyroidism    Internal hemorrhoids    Joint pain    Multifocal pneumonia 06/03/2021   Obstructive sleep apnea (adult) (pediatric)    Osteoarthritis    Ostium secundum type atrial septal defect    Other chronic nonalcoholic liver disease    Other pulmonary embolism and infarction    Palpitations    Paroxysmal ventricular tachycardia ([place])    Personal history of radiation therapy    PONV (postoperative nausea and vomiting)    Presence of permanent cardiac pacemaker    PUD (peptic ulcer disease)    Radiation 02/03/15-03/10/15   Right Breast   Sarcoid     per pt , not sure   Schatzki's ring    Shortness of breath    Sleep apnea    Stroke [facility]) 2013   tia/ pt feels it was around 2008 0r 2009   Takotsubo syndrome    Tubular adenoma of colon    Unspecified transient cerebral ischemia    Unspecified vitamin D deficiency    Past Surgical History:  Procedure Laterality Date   ABI  2006   normal   BREAST EXCISIONAL BIOPSY     BREAST LUMPECTOMY Right 2016   BREAST LUMPECTOMY WITH RADIOACTIVE SEED LOCALIZATION Right 01/03/2015   Procedure: BREAST LUMPECTOMY WITH RADIOACTIVE SEED LOCALIZATION;  Surgeon: Autumn Messing III, MD;  Location: [place];  Service: General;  Laterality: Right;   CARDIAC DEFIBRILLATOR PLACEMENT  2006; 2012   BSX single chamber ICD   carotid dopplers  2006   neg   CEREBRAL ANEURYSM REPAIR  02/1999   COLONOSCOPY     HAMMER TOE SURGERY  11/15/2020   3rd digit bilateral feet    hospitalization  2004   GI bleed, PUD, diverticulosis (EGD,colonscopy)   hospitalization     PE, NSVT, s/p defib  KNEE ARTHROSCOPY     bilateral   LEFT HEART CATHETERIZATION WITH CORONARY ANGIOGRAM N/A 02/22/2012   Procedure: LEFT HEART CATHETERIZATION WITH CORONARY ANGIOGRAM;  Surgeon: Burnell Blanks, MD;  Location: [facility] CATH LAB;  Service: Cardiovascular;  Laterality: N/A;   PARTIAL HYSTERECTOMY     Fibroids   TONGUE BIOPSY  12/12/2017   due to sore tongue and white patches/abnormal cells   TOTAL KNEE ARTHROPLASTY Left 02/13/2016   Procedure: TOTAL KNEE ARTHROPLASTY;  Surgeon: Vickey Huger, MD;  Location: [place];  Service: Orthopedics;  Laterality: Left;   TOTAL KNEE ARTHROPLASTY Right 05/22/2021   Procedure: TOTAL KNEE ARTHROPLASTY;  Surgeon: Vickey Huger, MD;  Location: [facility];  Service: Orthopedics;  Laterality: Right;  with block   UPPER GASTROINTESTINAL ENDOSCOPY     Patient Active Problem List   Diagnosis Date Noted   Partial symptomatic epilepsy with complex partial seizures, not intractable, without status epilepticus ([place])  08/10/2022   Low back pain 08/10/2022   Gait abnormality 05/04/2022   Muscle spasm 05/04/2022   Upper airway cough syndrome 01/19/2022   Chronic left shoulder pain 11/13/2021   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   Localized swelling of right lower leg 08/29/2021   Tonsillar exudate 08/29/2021   Candida vaginitis 08/28/2021   Chronic pain syndrome 08/25/2021   Multifocal pneumonia 06/03/2021   Pre-operative respiratory examination 05/18/2021   Statin myopathy 04/19/2021   Pain in joint of right shoulder 02/10/2021   Arthritis of both acromioclavicular joints 01/02/2021   Trochanteric bursitis of right hip 10/03/2020   Tailbone injury 05/05/2020   UTI (urinary tract infection) 05/05/2020   Insomnia 05/05/2020   Leg cramps 02/16/2020   Dysuria 02/16/2020   Right hip impingement syndrome 02/01/2020   Right bicipital tenosynovitis 02/01/2020   Bronchiectasis without complication ([place]) 82/42/3536   Myofascial pain 12/21/2019   Estrogen deficiency 12/10/2019   Obesity (BMI 30-39.9) 12/10/2019   Bronchiectasis with (acute) exacerbation ([place]) 10/23/2019   Elevated LFTs 10/15/2019   Lower abdominal pain 09/01/2019   Neck pain 10/25/2017   Paresthesias in left hand 10/25/2017   Paresthesia of foot, bilateral 04/12/2017   Primary osteoarthritis of both hands 09/27/2016   Primary osteoarthritis of both knees 09/27/2016   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   Nasopharyngitis, 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/2016   S/P total knee replacement 02/13/2016   Chronic pain of both knees 12/06/2015   Globus pharyngeus 05/17/2015   Genetic testing 12/23/2014   Family history of breast cancer    Family history of colon cancer    Family history of pancreatic cancer    History of breast cancer 11/23/2014   Allergy to ACE inhibitors 09/27/2014   Prediabetes 06/23/2013   Muscle spasms of both lower extremities 01/15/2013   Hx of Clostridium difficile infection 10/10/2012   Dyspnea on exertion 04/02/2012    NICM (nonischemic cardiomyopathy) ([place]) 03/07/2012   Post-menopausal 01/11/2012   Atypical chest pain 11/07/2011   Obstructive sleep apnea 04/04/2010   V-tach ([place]) 01/03/2009   ICD  [employer]  Single chamber 01/03/2009   PFO (patent foramen ovale) 09/29/2008   Vitamin D deficiency 08/17/2008   Hypothyroidism 11/18/2006   Hyperlipidemia 11/18/2006   Depression with anxiety 11/18/2006   Essential hypertension 11/18/2006   Mitral valve prolapse 11/18/2006   Cerebral aneurysm 11/18/2006   Allergic rhinitis 11/18/2006   GERD 11/18/2006   Diverticulosis of colon 11/18/2006   Fatty liver 11/18/2006   Fibromyalgia 11/18/2006    REFERRING DIAG: M54.50 (ICD-10-CM) - Left-sided low back pain without sciatica,  unspecified chronicity; M25.552 (ICD-10-CM) - Left hip pain  THERAPY DIAG:  Chronic bilateral low back pain without sciatica  Muscle weakness (generalized)  Pain in left hip  Cramp and spasm  Rationale for Evaluation and Treatment Rehabilitation  PERTINENT HISTORY:  Automatic cardioverter/defibrillator; Bilat knee replacements; arthritis; fibromyalgia; depression, High BMI  PRECAUTIONS: None  SUBJECTIVE:                                                                                                                                                                                      SUBJECTIVE STATEMENT: Pt reports she enjoyed the pool and her L hip has felt better. Pt also reports completing the prescribed prednisone dose pack.  PAIN:  Are you having pain? Yes: NPRS scale: 2/10 L hip Pain location: L lowback, gluteal, lat hip, groin, and ant thigh Pain description: ache, sharp occasionally, constant Aggravating factors: Walking, sleeping, in/out of car, bending over Relieving factors: Muscle relaxors, heat pad, tylenol On Eval pain range=6-8/10   OBJECTIVE: (objective measures completed at initial evaluation unless otherwise dated)   Pt reports xrays were  taken with [facility] yesterday. He was able to recall there was no fracture, proper spacing for the joint   PATIENT SURVEYS:  FOTO: Perceived function   38%, predicted   55%  09/04/22: 54%    SCREENING FOR RED FLAGS: Bowel or bladder incontinence: No Spinal tumors: No Compression fracture: No   COGNITION: Overall cognitive status: Within functional limits for tasks assessed                          SENSATION: WFL   MUSCLE LENGTH: Hamstrings: Right NT deg; Left NT deg Marcello Moores test: Right NT deg; Left NT deg   POSTURE: increased lumbar lordosis   PALPATION: TTP with increased muscle tightness to the L gluteal region   LUMBAR ROM:    AROM eval  Flexion Mod limited, low back pulling pain  Extension Mod limited, pain low back and L hip  Right lateral flexion Mod limited, pulling pain R hip  Left lateral flexion Mod limited, pain across low back  Right rotation Mod limited, pulling pain R hip and across low back  Left rotation Mod limited, pain across low back   (Blank rows = not tested)   LOWER EXTREMITY ROM:     L decreased in comparison to R  due to pain Passive  Right eval Left eval  Hip flexion   P  Hip extension   P  Hip abduction   P  Hip adduction      Hip internal rotation   P  Hip external rotation  P  Knee flexion      Knee extension      Ankle dorsiflexion      Ankle plantarflexion      Ankle inversion      Ankle eversion      P=pain  (Blank rows = not tested)   LOWER EXTREMITY MMT:   L LE strength is deceased in comparison to R due to Pain MMT Right eval Left eval  Hip flexion   P  Hip extension   P  Hip abduction   P  Hip adduction   P  Hip internal rotation   P  Hip external rotation   P  Knee flexion      Knee extension      Ankle dorsiflexion      Ankle plantarflexion      Ankle inversion      Ankle eversion      P=pain  (Blank rows = not tested)   LUMBAR SPECIAL TESTS:  Straight leg raise test: Negative, Slump test: Negative, and  SI Compression/distraction test: Negative          FADER L hip positive   FUNCTIONAL TESTS:  5 times sit to stand: 08/17/22: 40.8 sec (low mat, with UE) 2 minute walk test: 08/17/22: 185 Feet    GAIT: Distance walked: 160f Assistive device utilized: Single point cane Level of assistance: Complete Independence Comments: decreased pace   TODAY'S TREATMENT: [facility] Adult PT Treatment:                                                DATE: 09/11/22 Therapeutic Exercise: Therapeutic Exercise: Nustep L5 UE/LE x 6 minutes STS x10 mat table c airex Lateral paloff steps c YTB x3 8' Forward/backward c RTB x5 8' Backward/forward c RTB x5 8'  Manual Therapy: DTM to the L piriforms and gluteal muscles Skilled palpation to identify TrPs and taut muscle bands  Trigger Point Dry Needling Treatment: Pre-treatment instruction: Patient instructed on dry needling rationale, procedures, and possible side effects including pain during treatment (achy,cramping feeling), bruising, drop of blood, lightheadedness, nausea, sweating. Patient Consent Given: Yes Education handout provided: Previously provided Muscles treated: L piriformis, glut min, med and max  Needle size and number:  .[id number] stimulation performed: No Parameters: N/A Treatment response/outcome: Treatment response: muscle twitch Post-treatment instructions: Patient instructed to expect possible mild to moderate muscle soreness later today and/or tomorrow. Patient instructed in methods to reduce muscle soreness and to continue prescribed HEP. If patient was dry needled over the lung field, patient was instructed on signs and symptoms of pneumothorax and, however unlikely, to see immediate medical attention should they occur. Patient was also educated on signs and symptoms of infection and to seek medical attention should they occur. Patient verbalized understanding of these instructions and education.   TREATMENT 09/06/21:  Aquatic  therapy at [address] - therapeutic pool temp 92 degrees Pt enters building independently.  Treatment took place in water 3.8 to  4 ft 8 in.feet deep depending upon activity.  Pt entered and exited the pool via stair and handrails    Aquatic Therapy:  Water walking for warm up fwd/lat/bkwds  Standing: Walking march Hamstring curl x20 BIL Hip abd/add x20 BIL Hip Circles CC/CCW 2x10 each BIL Heel/toes raises - x20 Hip ext/flex with knee straight x 20 BIL Yellow noodle stomp  x20 ea Squats x20 Lunge fwd x20 Lunge lateral x20 Step ups on submerged step 2x10 BIL  Pt requires the buoyancy of water for active assisted exercises with buoyancy supported for strengthening and AROM exercises. Hydrostatic pressure also supports joints by unweighting joint load by at least 50 % in 3-4 feet depth water. 80% in chest to neck deep water. Water will provide assistance with movement using the current and laminar flow while the buoyancy reduces weight bearing. Pt requires the viscosity of the water for resistance with strengthening exercises.    [facility] Adult PT Treatment:                                                DATE: 09/04/22 Therapeutic Exercise: Nustep L5 UE/LE x 6 minutes Standing hip flexion 10 x 1 Standing hip abduction 10 x 1  Standing hip extension 10 x 1  Mini squat at counter STS x 10 from elevated mat  Seated lumbar flexion with green ball , added laterals Seated upper trap stretch Seated levator stretch Scap squeezes   PATIENT EDUCATION:  Education details: Eval findings, POC, HEP, self care Person educated: Patient, Parent, Spouse, and Child(ren) Education method: Explanation, Demonstration, Tactile cues, Verbal cues, and Handouts Education comprehension: verbalized understanding, returned demonstration, verbal cues required, and tactile cues required   HOME EXERCISE PROGRAM: Access Code: [email or web address] URL: [email or web address] Date:  08/22/2022 Prepared by: Gar Ponto  Exercises - Supine Lower Trunk Rotation  - 1-2 x daily - 7 x weekly - 1 sets - 5 reps - 5 hold - Supine Piriformis Stretch with Foot on Ground  - 1-2 x daily - 7 x weekly - 3 reps - 15 hold - Hooklying Single Knee to Chest  - 1-2 x daily - 7 x weekly - 1 sets - 3 reps - 15 hold - Supine Bridge  - 1-2 x daily - 7 x weekly - 1 sets - 10 reps - 3 hold - Hooklying Clamshell with Resistance  - 1 x daily - 7 x weekly - 1 sets - 10 reps - 3 hold - Supine Hip Adduction Isometric with Ball  - 1 x daily - 7 x weekly - 1 sets - 10 reps - 3 hold   ASSESSMENT:   CLINICAL IMPRESSION:.  PT was completed for manual therapy as noted above f/b TPDN to the piriformis and gluteals muscles. Pt reported switch response. Pt then completed [facility] lumbopelvic and hip strengthening therex. Pt reports improvement in her L hip/gluteal pain. Will reassess functional test next week. Pt will continue to benefit from skilled PT to address impairments for improved function.   OBJECTIVE IMPAIRMENTS: decreased activity tolerance, decreased balance, difficulty walking, decreased ROM, decreased strength, increased muscle spasms, obesity, and pain.    ACTIVITY LIMITATIONS: carrying, lifting, bending, sitting, standing, squatting, sleeping, stairs, and locomotion level   PARTICIPATION LIMITATIONS: meal prep, cleaning, laundry, shopping, and community activity   PERSONAL FACTORS: Fitness, Past/current experiences, Time since onset of injury/illness/exacerbation, and 3+ comorbidities:    Mod limited, pulling pain R hipare also affecting patient's functional outcome.    REHAB POTENTIAL: Good   CLINICAL DECISION MAKING: Evolving/moderate complexity   EVALUATION COMPLEXITY: Moderate     GOALS:   SHORT TERM GOALS: Target date: 08/24/22   Pt will be Ind in an initial HEP  Baseline: initiated Status: proper completion Goal status: MET  2.  Pt will voice understanding of measures to assist  in pain reduction Baseline: initiated Status: HEP and sleeping positions with support; use of cold pack Goal status: MET   LONG TERM GOALS: Target date: 09/21/22   Pt will be Ind in a final HEP to maintain achieved LOF Baseline: initiated Goal status: INITIAL   2.  Pt's trunk ROM will improve to minimal or less for improved function and as demonstration of decreased pain Baseline: see flow sheets Goal status: INITIAL   3.  Pt will report a decreased in pain to 3/10 or less with daily activities Baseline: 6-8/10 Status: 09/11/22= 2/10 Goal status: Improved   4.  Pt L hip will demonstrate ROM and strength equal to that of the R hip for improved function Baseline:  Goal status: INITIAL   5. Improve 5xSTS by MCID of 5" and 2MWT by MCID of 44f as indication of improved functional mobility  Baseline: TBA Goal status: INITIAL   6.  Pt's FOTO score will improved to the predicted value of 55% as indication of improved function  Baseline: 38% 09/04/22: 54% Goal status: Improved   PLAN:   PT FREQUENCY: 2x/week   PT DURATION: 6 weeks   PLANNED INTERVENTIONS: Therapeutic exercises, Therapeutic activity, Neuromuscular re-education, Balance training, Gait training, Patient/Family education, Self Care, Joint mobilization, Stair training, Aquatic Therapy, Dry Needling, Electrical stimulation, Spinal manipulation, Spinal mobilization, Cryotherapy, Moist heat, Taping, Traction, Ultrasound, Ionotophoresis '4mg'$ /ml Dexamethasone, Manual therapy, and Re-evaluation.   PLAN FOR NEXT SESSION: FOTO status ; assess response to HEP; progress therex as indicated; use of modalities, manual therapy; and TPDN as indicated. [facility] was given [facility].  Pt has pacemaker, NO IONTO or ESTIM   Keylon Labelle MS, PT 09/11/22 2:53 PM

## 2022-09-10 NOTE — Telephone Encounter (Signed)
Pharmacy Patient Advocate Encounter  Prior Authorization for Repatha '140MG'$ /ML has been approved.    Key# [id number] Effective dates: 1.1.24 through 12.31.24  Spoke with Pharmacy to process.

## 2022-09-10 NOTE — Telephone Encounter (Signed)
Enrolled in the [place] NO. [id number]   CARD STATUS Active   BIN [id number]   PCN [id number]   PC GROUP [id number] Patient informed  About PA approval  Fatima Sanger co-pay card information  Follow up lipid lab in 2-3 months

## 2022-09-11 ENCOUNTER — Ambulatory Visit: Payer: Medicare Other

## 2022-09-11 DIAGNOSIS — M545 Low back pain, unspecified: Secondary | ICD-10-CM | POA: Diagnosis not present

## 2022-09-11 DIAGNOSIS — M25552 Pain in left hip: Secondary | ICD-10-CM

## 2022-09-11 DIAGNOSIS — M6281 Muscle weakness (generalized): Secondary | ICD-10-CM

## 2022-09-11 DIAGNOSIS — R252 Cramp and spasm: Secondary | ICD-10-CM

## 2022-09-12 ENCOUNTER — Encounter: Payer: Self-pay | Admitting: Physical Medicine and Rehabilitation

## 2022-09-12 ENCOUNTER — Other Ambulatory Visit: Payer: Medicare Other

## 2022-09-12 ENCOUNTER — Encounter
Payer: Medicare Other | Attending: Physical Medicine and Rehabilitation | Admitting: Physical Medicine and Rehabilitation

## 2022-09-12 VITALS — BP 109/75 | HR 79 | Ht 66.5 in | Wt 216.0 lb

## 2022-09-12 DIAGNOSIS — Z5181 Encounter for therapeutic drug level monitoring: Secondary | ICD-10-CM | POA: Diagnosis present

## 2022-09-12 DIAGNOSIS — G894 Chronic pain syndrome: Secondary | ICD-10-CM | POA: Insufficient documentation

## 2022-09-12 DIAGNOSIS — M7918 Myalgia, other site: Secondary | ICD-10-CM | POA: Diagnosis not present

## 2022-09-12 DIAGNOSIS — Z79891 Long term (current) use of opiate analgesic: Secondary | ICD-10-CM | POA: Insufficient documentation

## 2022-09-12 DIAGNOSIS — Z1329 Encounter for screening for other suspected endocrine disorder: Secondary | ICD-10-CM

## 2022-09-12 DIAGNOSIS — G40909 Epilepsy, unspecified, not intractable, without status epilepticus: Secondary | ICD-10-CM | POA: Diagnosis present

## 2022-09-12 DIAGNOSIS — M25511 Pain in right shoulder: Secondary | ICD-10-CM | POA: Insufficient documentation

## 2022-09-12 MED ORDER — ACETAMINOPHEN-CODEINE 300-30 MG PO TABS: 1.0000 | ORAL_TABLET | Freq: Three times a day (TID) | ORAL | 3 refills | Status: DC | PRN

## 2022-09-12 MED ORDER — TRIAMCINOLONE ACETONIDE 40 MG/ML IJ SUSP: 40.0000 mg | Freq: Once | INTRAMUSCULAR | Status: DC

## 2022-09-12 MED ORDER — LIDOCAINE HCL 1 % IJ SOLN: 3.0000 mL | Freq: Once | INTRAMUSCULAR | Status: AC

## 2022-09-12 MED ORDER — LIDOCAINE HCL 1 % IJ SOLN: 1.0000 mL | Freq: Once | INTRAMUSCULAR | Status: AC

## 2022-09-12 MED ORDER — TIZANIDINE HCL 4 MG PO TABS: 2.0000 mg | ORAL_TABLET | Freq: Three times a day (TID) | ORAL | 5 refills | Status: DC | PRN

## 2022-09-12 NOTE — Progress Notes (Signed)
Patient is a 72 yr old R handed female with hx of  Bronchiesctasis, CHF, prediabetes- with A1c of 5.6- as of 02/15/21- - not on blood thinners, has a defibrillator, GERD, and fibromyalgia issues- dx'd 10 years ago.  Here for f/u on chronic pain and trigger oint injections. Last got subacromial shoulder injections B/L on 03/29/21.  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  Here for B/L shoulder injections- last done 05/25/22- and trigger point injections.        Just got off steroids - was given for L hip.   Has been doing PT for L hip- Dr Zenovia Jarred-  Feeling a lot better.  From a fall.  Also PCP gave a steroid injections x2 in November as well in buttocks.   Was referred to Breast clinic- found small lump and hx of breast CA.  Was referred to Pulmonary for lungs- pulmonary fibrosis- .  Decide if needs biopsy or meds or what the plan is?   CK levels are elevated- trying on new meds- Repatha- hasn't picke dit up yet.  Since cannot use statins.  Still had something with aorta- per CT scan, shows atherosclerosis.      Plan: Cannot do B/L steroid injections for shoulders today since just finished PO steroids- high dose.   2. Patient here for trigger point injections for  Consent done and on chart.  Cleaned areas with alcohol and injected using a 27 gauge 1.5 inch needle  Injected 3cc Using 1% Lidocaine with no EPI  Upper traps L side x3 with 1 suprapspnatus Levators  l only Posterior scalenes Middle scalenes L only Splenius Capitus Pectoralis Major Rhomboids Infraspinatus Teres Major/minor Thoracic paraspinals Lumbar paraspinals Other injections- L SCM    Patient's level of pain prior was Current level of pain after injections is- has relaxed a little-   There was no bleeding or complications.  Patient was advised to drink a lot of water on day after injections to flush system Will have increased soreness for 12-48 hours after injections.   Can use Lidocaine patches the day AFTER injections Can use theracane on day of injections in places didn't inject Can use heating pad 4-6 hours AFTER injections   3. Neuro concerned might have seizures, so took off Baclofen. On Clobazam-  so cannot have Baclofen back  4. Also, then if concern for seizures, need to stop Tramadol- since can lower seizure threshold.  Takes tramadol 1x/day- will stop   5.  Pt willing to take Tylenol #3 with codeine- since "only makes itch a little)- doesn't want to go to Hydrocodone- will send in Rx TID prn #90 with 3 refills.   6. Tizanidine for muscle spasms instead of baclofen 2-4 mg up to 3x/day as needed for muscle spasms   7. F/U in 3 months- TrP injections and f/u on chronic pain.   Wait list for B/L shoulder injections in ~ 6 weeks   I spent a total of 34   minutes on total care today- >50% coordination of care- due to 10 minute son injections; 24 minutes discussing seizures, and what meds need to be changed as a result.

## 2022-09-12 NOTE — Patient Instructions (Signed)
  Plan: Cannot do B/L steroid injections for shoulders today since just finished PO steroids- high dose.   2. Patient here for trigger point injections for  Consent done and on chart.  Cleaned areas with alcohol and injected using a 27 gauge 1.5 inch needle  Injected 3cc Using 1% Lidocaine with no EPI  Upper traps L side x3 with 1 suprapspnatus Levators  l only Posterior scalenes Middle scalenes L only Splenius Capitus Pectoralis Major Rhomboids Infraspinatus Teres Major/minor Thoracic paraspinals Lumbar paraspinals Other injections- L SCM    Patient's level of pain prior was Current level of pain after injections is- has relaxed a little-   There was no bleeding or complications.  Patient was advised to drink a lot of water on day after injections to flush system Will have increased soreness for 12-48 hours after injections.  Can use Lidocaine patches the day AFTER injections Can use theracane on day of injections in places didn't inject Can use heating pad 4-6 hours AFTER injections   3. Neuro concerned might have seizures, so took off Baclofen. On Clobazam-  so cannot have Baclofen back  4. Also, then if concern for seizures, need to stop Tramadol- since can lower seizure threshold.  Takes tramadol 1x/day- will stop   5.  Pt willing to take Tylenol #3 with codeine- since "only makes itch a little)- doesn't want to go to Hydrocodone- will send in Rx TID prn #90 with 3 refills.   6. Tizanidine for muscle spasms instead of baclofen 2-4 mg up to 3x/day as needed for muscle spasms   7. F/U in 3 months- TrP injections and f/u on chronic pain.   Wait list for B/L shoulder injections in ~ 6 weeks

## 2022-09-13 ENCOUNTER — Encounter: Payer: Medicare Other | Admitting: Physical Therapy

## 2022-09-13 ENCOUNTER — Encounter: Payer: Self-pay | Admitting: Physical Therapy

## 2022-09-13 ENCOUNTER — Ambulatory Visit: Payer: Medicare Other | Admitting: Physical Therapy

## 2022-09-13 DIAGNOSIS — R252 Cramp and spasm: Secondary | ICD-10-CM

## 2022-09-13 DIAGNOSIS — R278 Other lack of coordination: Secondary | ICD-10-CM

## 2022-09-13 DIAGNOSIS — M545 Low back pain, unspecified: Secondary | ICD-10-CM

## 2022-09-13 DIAGNOSIS — M25552 Pain in left hip: Secondary | ICD-10-CM

## 2022-09-13 DIAGNOSIS — M6281 Muscle weakness (generalized): Secondary | ICD-10-CM

## 2022-09-13 DIAGNOSIS — R3915 Urgency of urination: Secondary | ICD-10-CM

## 2022-09-13 LAB — TSH: TSH: 3.1 u[IU]/mL (ref 0.450–4.500)

## 2022-09-13 NOTE — Therapy (Signed)
OUTPATIENT PHYSICAL THERAPY TREATMENT NOTE   Patient Name: [name] MRN: [id number] DOB:May 13, 1951, 72 y.o., female Today's Date: 09/13/2022  PCP: Camillia Herter, NP   REFERRING PROVIDER: Dorna Mai, MD  END OF SESSION:   PT End of Session - 09/13/22 1400     Visit Number 9    Number of Visits 13    Date for PT Re-Evaluation 09/21/22    Authorization Type MEDICARE PART A AND B    Progress Note Due on Visit 10    PT Start Time 0200    PT Stop Time 0242    PT Time Calculation (min) 42 min    Activity Tolerance Patient tolerated treatment well    Behavior During Therapy WFL for tasks assessed/performed               Past Medical History:  Diagnosis Date   AICD (automatic cardioverter/defibrillator) present    Anxiety    Arthritis    Back pain    Breast cancer ([place]) 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   Bronchiectasis ([place])    Cerebral aneurysm, nonruptured    had a clip put in   CHF (congestive heart failure) (HCC)    Clostridium difficile infection    Depressive disorder, not elsewhere classified    Diverticulosis of colon (without mention of hemorrhage)    Esophageal reflux    Fatty liver    Fibromyalgia    Gastritis    GERD (gastroesophageal reflux disease)    GI bleed 2004   Glaucoma    Hiatal hernia    History of COVID-19 05/04/2021   Hyperlipidemia    Hypertension    Hypothyroidism    Internal hemorrhoids    Joint pain    Multifocal pneumonia 06/03/2021   Obstructive sleep apnea (adult) (pediatric)    Osteoarthritis    Ostium secundum type atrial septal defect    Other chronic nonalcoholic liver disease    Other pulmonary embolism and infarction    Palpitations    Paroxysmal ventricular tachycardia ([place])    Personal history of radiation therapy    PONV (postoperative nausea and vomiting)    Presence of permanent cardiac pacemaker    PUD (peptic ulcer disease)    Radiation 02/03/15-03/10/15   Right Breast   Sarcoid     per pt , not sure   Schatzki's ring    Shortness of breath    Sleep apnea    Stroke [facility]) 2013   tia/ pt feels it was around 2008 0r 2009   Takotsubo syndrome    Tubular adenoma of colon    Unspecified transient cerebral ischemia    Unspecified vitamin D deficiency    Past Surgical History:  Procedure Laterality Date   ABI  2006   normal   BREAST EXCISIONAL BIOPSY     BREAST LUMPECTOMY Right 2016   BREAST LUMPECTOMY WITH RADIOACTIVE SEED LOCALIZATION Right 01/03/2015   Procedure: BREAST LUMPECTOMY WITH RADIOACTIVE SEED LOCALIZATION;  Surgeon: Autumn Messing III, MD;  Location: [facility];  Service: General;  Laterality: Right;   CARDIAC DEFIBRILLATOR PLACEMENT  2006; 2012   BSX single chamber ICD   carotid dopplers  2006   neg   CEREBRAL ANEURYSM REPAIR  02/1999   COLONOSCOPY     HAMMER TOE SURGERY  11/15/2020   3rd digit bilateral feet    hospitalization  2004   GI bleed, PUD, diverticulosis (EGD,colonscopy)   hospitalization     PE, NSVT, s/p defib  KNEE ARTHROSCOPY     bilateral   LEFT HEART CATHETERIZATION WITH CORONARY ANGIOGRAM N/A 02/22/2012   Procedure: LEFT HEART CATHETERIZATION WITH CORONARY ANGIOGRAM;  Surgeon: Burnell Blanks, MD;  Location: [facility] CATH LAB;  Service: Cardiovascular;  Laterality: N/A;   PARTIAL HYSTERECTOMY     Fibroids   TONGUE BIOPSY  12/12/2017   due to sore tongue and white patches/abnormal cells   TOTAL KNEE ARTHROPLASTY Left 02/13/2016   Procedure: TOTAL KNEE ARTHROPLASTY;  Surgeon: Vickey Huger, MD;  Location: [facility];  Service: Orthopedics;  Laterality: Left;   TOTAL KNEE ARTHROPLASTY Right 05/22/2021   Procedure: TOTAL KNEE ARTHROPLASTY;  Surgeon: Vickey Huger, MD;  Location: [facility];  Service: Orthopedics;  Laterality: Right;  with block   UPPER GASTROINTESTINAL ENDOSCOPY     Patient Active Problem List   Diagnosis Date Noted   Partial symptomatic epilepsy with complex partial seizures, not intractable, without status epilepticus ([place])  08/10/2022   Low back pain 08/10/2022   Gait abnormality 05/04/2022   Muscle spasm 05/04/2022   Upper airway cough syndrome 01/19/2022   Chronic left shoulder pain 11/13/2021   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   Localized swelling of right lower leg 08/29/2021   Tonsillar exudate 08/29/2021   Candida vaginitis 08/28/2021   Chronic pain syndrome 08/25/2021   Multifocal pneumonia 06/03/2021   Pre-operative respiratory examination 05/18/2021   Statin myopathy 04/19/2021   Pain in joint of right shoulder 02/10/2021   Arthritis of both acromioclavicular joints 01/02/2021   Trochanteric bursitis of right hip 10/03/2020   Tailbone injury 05/05/2020   UTI (urinary tract infection) 05/05/2020   Insomnia 05/05/2020   Leg cramps 02/16/2020   Dysuria 02/16/2020   Right hip impingement syndrome 02/01/2020   Right bicipital tenosynovitis 02/01/2020   Bronchiectasis without complication ([place]) 25/42/7062   Myofascial pain 12/21/2019   Estrogen deficiency 12/10/2019   Obesity (BMI 30-39.9) 12/10/2019   Bronchiectasis with (acute) exacerbation ([place]) 10/23/2019   Elevated LFTs 10/15/2019   Lower abdominal pain 09/01/2019   Neck pain 10/25/2017   Paresthesias in left hand 10/25/2017   Paresthesia of foot, bilateral 04/12/2017   Primary osteoarthritis of both hands 09/27/2016   Primary osteoarthritis of both knees 09/27/2016   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   Nasopharyngitis, 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/2016   S/P total knee replacement 02/13/2016   Chronic pain of both knees 12/06/2015   Globus pharyngeus 05/17/2015   Genetic testing 12/23/2014   Family history of breast cancer    Family history of colon cancer    Family history of pancreatic cancer    History of breast cancer 11/23/2014   Allergy to ACE inhibitors 09/27/2014   Prediabetes 06/23/2013   Muscle spasms of both lower extremities 01/15/2013   Hx of Clostridium difficile infection 10/10/2012   Dyspnea on exertion 04/02/2012    NICM (nonischemic cardiomyopathy) ([place]) 03/07/2012   Post-menopausal 01/11/2012   Atypical chest pain 11/07/2011   Obstructive sleep apnea 04/04/2010   V-tach ([place]) 01/03/2009   ICD  [employer]  Single chamber 01/03/2009   PFO (patent foramen ovale) 09/29/2008   Vitamin D deficiency 08/17/2008   Hypothyroidism 11/18/2006   Hyperlipidemia 11/18/2006   Depression with anxiety 11/18/2006   Essential hypertension 11/18/2006   Mitral valve prolapse 11/18/2006   Cerebral aneurysm 11/18/2006   Allergic rhinitis 11/18/2006   GERD 11/18/2006   Diverticulosis of colon 11/18/2006   Fatty liver 11/18/2006   Fibromyalgia 11/18/2006    REFERRING DIAG: M54.50 (ICD-10-CM) - Left-sided low back pain without sciatica,  unspecified chronicity; M25.552 (ICD-10-CM) - Left hip pain  THERAPY DIAG:  Chronic bilateral low back pain without sciatica  Muscle weakness (generalized)  Pain in left hip  Cramp and spasm  Other lack of coordination  Urinary urgency  Rationale for Evaluation and Treatment Rehabilitation  PERTINENT HISTORY:  Automatic cardioverter/defibrillator; Bilat knee replacements; arthritis; fibromyalgia; depression, High BMI  PRECAUTIONS: None  SUBJECTIVE:                                                                                                                                                                                      SUBJECTIVE STATEMENT:   Pt states that he walk to the pool is very long and that it hurt her back.  Overall she is doing well and sees improvement   PAIN:  Are you having pain? Yes: NPRS scale: 2/10 L hip Pain location: L lowback, gluteal, lat hip, groin, and ant thigh Pain description: ache, sharp occasionally, constant Aggravating factors: Walking, sleeping, in/out of car, bending over Relieving factors: Muscle relaxors, heat pad, tylenol On Eval pain range=6-8/10   OBJECTIVE: (objective measures completed at initial evaluation  unless otherwise dated)   Pt reports xrays were taken with [facility] yesterday. He was able to recall there was no fracture, proper spacing for the joint   PATIENT SURVEYS:  FOTO: Perceived function   38%, predicted   55%  09/04/22: 54%    SCREENING FOR RED FLAGS: Bowel or bladder incontinence: No Spinal tumors: No Compression fracture: No   COGNITION: Overall cognitive status: Within functional limits for tasks assessed                          SENSATION: WFL   MUSCLE LENGTH: Hamstrings: Right NT deg; Left NT deg Marcello Moores test: Right NT deg; Left NT deg   POSTURE: increased lumbar lordosis   PALPATION: TTP with increased muscle tightness to the L gluteal region   LUMBAR ROM:    AROM eval  Flexion Mod limited, low back pulling pain  Extension Mod limited, pain low back and L hip  Right lateral flexion Mod limited, pulling pain R hip  Left lateral flexion Mod limited, pain across low back  Right rotation Mod limited, pulling pain R hip and across low back  Left rotation Mod limited, pain across low back   (Blank rows = not tested)   LOWER EXTREMITY ROM:     L decreased in comparison to R  due to pain Passive  Right eval Left eval  Hip flexion   P  Hip extension   P  Hip abduction   P  Hip adduction  Hip internal rotation   P  Hip external rotation   P  Knee flexion      Knee extension      Ankle dorsiflexion      Ankle plantarflexion      Ankle inversion      Ankle eversion      P=pain  (Blank rows = not tested)   LOWER EXTREMITY MMT:   L LE strength is deceased in comparison to R due to Pain MMT Right eval Left eval  Hip flexion   P  Hip extension   P  Hip abduction   P  Hip adduction   P  Hip internal rotation   P  Hip external rotation   P  Knee flexion      Knee extension      Ankle dorsiflexion      Ankle plantarflexion      Ankle inversion      Ankle eversion      P=pain  (Blank rows = not tested)   LUMBAR SPECIAL TESTS:  Straight  leg raise test: Negative, Slump test: Negative, and SI Compression/distraction test: Negative          FADER L hip positive   FUNCTIONAL TESTS:  5 times sit to stand: 08/17/22: 40.8 sec (low mat, with UE) 2 minute walk test: 08/17/22: 185 Feet    GAIT: Distance walked: 139f Assistive device utilized: Single point cane Level of assistance: Complete Independence Comments: decreased pace   TODAY'S TREATMENT:  TREATMENT 09/13/22:  [address] therapeutic pool temp 92 degrees Pt enters building independently.  Treatment took place in water 3.8 to  4 ft 8 in.feet deep depending upon activity.  Pt entered and exited the pool via stair and handrails    Aquatic Therapy:  Water walking for warm up fwd/lat/bkwds  Standing: Walking march Hamstring curl 2x15 BIL Hip abd/add 2x15 BIL crossing midline Hip Circles CC/CCW 2x10 each BIL Heel/toes raises - 2x15u Hip ext/flex with knee straight x 20 BIL Yellow noodle stomp x20 ea Squats 2x15 Sit to stand - 20x Lunge fwd x20 (not today) Lunge lateral x20 (not today) Step ups on submerged step 2x10 BIL  Pt requires the buoyancy of water for active assisted exercises with buoyancy supported for strengthening and AROM exercises. Hydrostatic pressure also supports joints by unweighting joint load by at least 50 % in 3-4 feet depth water. 80% in chest to neck deep water. Water will provide assistance with movement using the current and laminar flow while the buoyancy reduces weight bearing. Pt requires the viscosity of the water for resistance with strengthening exercises.  [facility]:                                                DATE: 09/11/22 Therapeutic Exercise: Therapeutic Exercise: Nustep L5 UE/LE x 6 minutes STS x10 mat table c airex Lateral paloff steps c YTB x3 8' Forward/backward c RTB x5 8' Backward/forward c RTB x5 8'  Manual Therapy: DTM to the L piriforms and gluteal  muscles Skilled palpation to identify TrPs and taut muscle bands  Trigger Point Dry Needling Treatment: Pre-treatment instruction: Patient instructed on dry needling rationale, procedures, and possible side effects including pain during treatment (achy,cramping feeling), bruising, drop of blood, lightheadedness, nausea, sweating. Patient Consent Given: Yes Education handout  provided: Previously provided Muscles treated: L piriformis, glut min, med and max  Needle size and number:  .30x163m Electrical stimulation performed: No Parameters: N/A Treatment response/outcome: Treatment response: muscle twitch Post-treatment instructions: Patient instructed to expect possible mild to moderate muscle soreness later today and/or tomorrow. Patient instructed in methods to reduce muscle soreness and to continue prescribed HEP. If patient was dry needled over the lung field, patient was instructed on signs and symptoms of pneumothorax and, however unlikely, to see immediate medical attention should they occur. Patient was also educated on signs and symptoms of infection and to seek medical attention should they occur. Patient verbalized understanding of these instructions and education.   TREATMENT 09/06/21:  [address] therapeutic pool temp 92 degrees Pt enters building independently.  Treatment took place in water 3.8 to  4 ft 8 in.feet deep depending upon activity.  Pt entered and exited the pool via stair and handrails    Aquatic Therapy:  Water walking for warm up fwd/lat/bkwds  Standing: Walking march Hamstring curl x20 BIL Hip abd/add x20 BIL Hip Circles CC/CCW 2x10 each BIL Heel/toes raises - x20 Hip ext/flex with knee straight x 20 BIL Yellow noodle stomp x20 ea Squats x20 Lunge fwd x20 Lunge lateral x20 Step ups on submerged step 2x10 BIL  Pt requires the buoyancy of water for active assisted exercises with buoyancy supported for strengthening  and AROM exercises. Hydrostatic pressure also supports joints by unweighting joint load by at least 50 % in 3-4 feet depth water. 80% in chest to neck deep water. Water will provide assistance with movement using the current and laminar flow while the buoyancy reduces weight bearing. Pt requires the viscosity of the water for resistance with strengthening exercises.    [facility]:                                                DATE: 09/04/22 Therapeutic Exercise: Nustep L5 UE/LE x 6 minutes Standing hip flexion 10 x 1 Standing hip abduction 10 x 1  Standing hip extension 10 x 1  Mini squat at counter STS x 10 from elevated mat  Seated lumbar flexion with green ball , added laterals Seated upper trap stretch Seated levator stretch Scap squeezes   PATIENT EDUCATION:  Education details: Eval findings, POC, HEP, self care Person educated: Patient, Parent, Spouse, and Child(ren) Education method: Explanation, Demonstration, Tactile cues, Verbal cues, and Handouts Education comprehension: verbalized understanding, returned demonstration, verbal cues required, and tactile cues required   HOME EXERCISE PROGRAM: Access Code: [email or web address] URL: [email or web address] Date: 08/22/2022 Prepared by: AGar Ponto Exercises - Supine Lower Trunk Rotation  - 1-2 x daily - 7 x weekly - 1 sets - 5 reps - 5 hold - Supine Piriformis Stretch with Foot on Ground  - 1-2 x daily - 7 x weekly - 3 reps - 15 hold - Hooklying Single Knee to Chest  - 1-2 x daily - 7 x weekly - 1 sets - 3 reps - 15 hold - Supine Bridge  - 1-2 x daily - 7 x weekly - 1 sets - 10 reps - 3 hold - Hooklying Clamshell with Resistance  - 1 x daily - 7 x weekly - 1 sets - 10 reps - 3 hold - Supine Hip Adduction Isometric with BDiona Foley -  1 x daily - 7 x weekly - 1 sets - 10 reps - 3 hold   ASSESSMENT:   CLINICAL IMPRESSION:.  Session today focused on hip strengthening and ROM in the aquatic environment for use of  buoyancy to offload joints and the viscosity of water as resistance during therapeutic exercise.  Pt able to increase volume of listed exercises and shows overall improved strength and endurance with reduced pain.  Patient was able to tolerate all prescribed exercises in the aquatic environment with no adverse effects and reports 2/10 pain at the end of the session. Patient continues to benefit from skilled PT services on land and aquatic based and should be progressed as able to improve functional independence.     OBJECTIVE IMPAIRMENTS: decreased activity tolerance, decreased balance, difficulty walking, decreased ROM, decreased strength, increased muscle spasms, obesity, and pain.    ACTIVITY LIMITATIONS: carrying, lifting, bending, sitting, standing, squatting, sleeping, stairs, and locomotion level   PARTICIPATION LIMITATIONS: meal prep, cleaning, laundry, shopping, and community activity   PERSONAL FACTORS: Fitness, Past/current experiences, Time since onset of injury/illness/exacerbation, and 3+ comorbidities:    Mod limited, pulling pain R hipare also affecting patient's functional outcome.    REHAB POTENTIAL: Good   CLINICAL DECISION MAKING: Evolving/moderate complexity   EVALUATION COMPLEXITY: Moderate     GOALS:   SHORT TERM GOALS: Target date: 08/24/22   Pt will be Ind in an initial HEP  Baseline: initiated Status: proper completion Goal status: MET   2.  Pt will voice understanding of measures to assist in pain reduction Baseline: initiated Status: HEP and sleeping positions with support; use of cold pack Goal status: MET   LONG TERM GOALS: Target date: 09/21/22   Pt will be Ind in a final HEP to maintain achieved LOF Baseline: initiated Goal status: INITIAL   2.  Pt's trunk ROM will improve to minimal or less for improved function and as demonstration of decreased pain Baseline: see flow sheets Goal status: INITIAL   3.  Pt will report a decreased in pain to 3/10 or  less with daily activities Baseline: 6-8/10 Status: 09/11/22= 2/10 Goal status: Improved   4.  Pt L hip will demonstrate ROM and strength equal to that of the R hip for improved function Baseline:  Goal status: INITIAL   5. Improve 5xSTS by MCID of 5" and 2MWT by MCID of 43f as indication of improved functional mobility  Baseline: TBA Goal status: INITIAL   6.  Pt's FOTO score will improved to the predicted value of 55% as indication of improved function  Baseline: 38% 09/04/22: 54% Goal status: Improved   PLAN:   PT FREQUENCY: 2x/week   PT DURATION: 6 weeks   PLANNED INTERVENTIONS: Therapeutic exercises, Therapeutic activity, Neuromuscular re-education, Balance training, Gait training, Patient/Family education, Self Care, Joint mobilization, Stair training, Aquatic Therapy, Dry Needling, Electrical stimulation, Spinal manipulation, Spinal mobilization, Cryotherapy, Moist heat, Taping, Traction, Ultrasound, Ionotophoresis '4mg'$ /ml Dexamethasone, Manual therapy, and Re-evaluation.   PLAN FOR NEXT SESSION: FOTO status ; assess response to HEP; progress therex as indicated; use of modalities, manual therapy; and TPDN as indicated. [facility] was given info sheet.  Pt has pacemaker, NO IONTO or EMadison HickmanPT 09/13/22 2:49 PM

## 2022-09-14 ENCOUNTER — Telehealth: Payer: Self-pay

## 2022-09-14 NOTE — Telephone Encounter (Signed)
PA for Tylenol #3 has been approved until 08/20/2023. Approval faxed to the pharmacy.

## 2022-09-15 LAB — TOXASSURE SELECT,+ANTIDEPR,UR

## 2022-09-18 ENCOUNTER — Ambulatory Visit: Payer: Medicare Other

## 2022-09-18 DIAGNOSIS — G8929 Other chronic pain: Secondary | ICD-10-CM

## 2022-09-18 DIAGNOSIS — M25552 Pain in left hip: Secondary | ICD-10-CM

## 2022-09-18 DIAGNOSIS — M545 Low back pain, unspecified: Secondary | ICD-10-CM | POA: Diagnosis not present

## 2022-09-18 DIAGNOSIS — M6281 Muscle weakness (generalized): Secondary | ICD-10-CM

## 2022-09-18 DIAGNOSIS — R252 Cramp and spasm: Secondary | ICD-10-CM

## 2022-09-18 NOTE — Therapy (Signed)
OUTPATIENT PHYSICAL THERAPY TREATMENT NOTE/Progress Note/Re-Cert   Patient Name: [name] MRN: [id number] DOB:1951-05-28, 72 y.o., female Today's Date: 09/18/2022  PCP: Camillia Herter, NP   REFERRING PROVIDER: Dorna Mai, MD  Progress Note Reporting Period 1215/24 to 09/18/22  See note below for Objective Data and Assessment of Progress/Goals.      END OF SESSION:   PT End of Session - 09/18/22 1342     Visit Number 10    Number of Visits 22    Date for PT Re-Evaluation 11/06/22    Authorization Type MEDICARE PART A AND B    Progress Note Due on Visit 10    PT Start Time 1333    PT Stop Time 1418    PT Time Calculation (min) 45 min    Activity Tolerance Patient tolerated treatment well    Behavior During Therapy WFL for tasks assessed/performed                Past Medical History:  Diagnosis Date   AICD (automatic cardioverter/defibrillator) present    Anxiety    Arthritis    Back pain    Breast cancer ([place]) 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   Bronchiectasis ([place])    Cerebral aneurysm, nonruptured    had a clip put in   CHF (congestive heart failure) (HCC)    Clostridium difficile infection    Depressive disorder, not elsewhere classified    Diverticulosis of colon (without mention of hemorrhage)    Esophageal reflux    Fatty liver    Fibromyalgia    Gastritis    GERD (gastroesophageal reflux disease)    GI bleed 2004   Glaucoma    Hiatal hernia    History of COVID-19 05/04/2021   Hyperlipidemia    Hypertension    Hypothyroidism    Internal hemorrhoids    Joint pain    Multifocal pneumonia 06/03/2021   Obstructive sleep apnea (adult) (pediatric)    Osteoarthritis    Ostium secundum type atrial septal defect    Other chronic nonalcoholic liver disease    Other pulmonary embolism and infarction    Palpitations    Paroxysmal ventricular tachycardia (HCC)    Personal history of radiation therapy    PONV (postoperative  nausea and vomiting)    Presence of permanent cardiac pacemaker    PUD (peptic ulcer disease)    Radiation 02/03/15-03/10/15   Right Breast   Sarcoid    per pt , not sure   Schatzki's ring    Shortness of breath    Sleep apnea    Stroke [facility]) 2013   tia/ pt feels it was around 2008 0r 2009   Takotsubo syndrome    Tubular adenoma of colon    Unspecified transient cerebral ischemia    Unspecified vitamin D deficiency    Past Surgical History:  Procedure Laterality Date   ABI  2006   normal   BREAST EXCISIONAL BIOPSY     BREAST LUMPECTOMY Right 2016   BREAST LUMPECTOMY WITH RADIOACTIVE SEED LOCALIZATION Right 01/03/2015   Procedure: BREAST LUMPECTOMY WITH RADIOACTIVE SEED LOCALIZATION;  Surgeon: Autumn Messing III, MD;  Location: [place];  Service: General;  Laterality: Right;   CARDIAC DEFIBRILLATOR PLACEMENT  2006; 2012   BSX single chamber ICD   carotid dopplers  2006   neg   CEREBRAL ANEURYSM REPAIR  02/1999   COLONOSCOPY     HAMMER TOE SURGERY  11/15/2020   3rd digit bilateral  feet    hospitalization  2004   GI bleed, PUD, diverticulosis (EGD,colonscopy)   hospitalization     PE, NSVT, s/p defib   KNEE ARTHROSCOPY     bilateral   LEFT HEART CATHETERIZATION WITH CORONARY ANGIOGRAM N/A 02/22/2012   Procedure: LEFT HEART CATHETERIZATION WITH CORONARY ANGIOGRAM;  Surgeon: Burnell Blanks, MD;  Location: [facility] CATH LAB;  Service: Cardiovascular;  Laterality: N/A;   PARTIAL HYSTERECTOMY     Fibroids   TONGUE BIOPSY  12/12/2017   due to sore tongue and white patches/abnormal cells   TOTAL KNEE ARTHROPLASTY Left 02/13/2016   Procedure: TOTAL KNEE ARTHROPLASTY;  Surgeon: Vickey Huger, MD;  Location: [place];  Service: Orthopedics;  Laterality: Left;   TOTAL KNEE ARTHROPLASTY Right 05/22/2021   Procedure: TOTAL KNEE ARTHROPLASTY;  Surgeon: Vickey Huger, MD;  Location: [facility];  Service: Orthopedics;  Laterality: Right;  with block   UPPER GASTROINTESTINAL ENDOSCOPY     Patient  Active Problem List   Diagnosis Date Noted   Partial symptomatic epilepsy with complex partial seizures, not intractable, without status epilepticus ([place]) 08/10/2022   Low back pain 08/10/2022   Gait abnormality 05/04/2022   Muscle spasm 05/04/2022   Upper airway cough syndrome 01/19/2022   Chronic left shoulder pain 11/13/2021   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   Localized swelling of right lower leg 08/29/2021   Tonsillar exudate 08/29/2021   Candida vaginitis 08/28/2021   Chronic pain syndrome 08/25/2021   Multifocal pneumonia 06/03/2021   Pre-operative respiratory examination 05/18/2021   Statin myopathy 04/19/2021   Pain in joint of right shoulder 02/10/2021   Arthritis of both acromioclavicular joints 01/02/2021   Trochanteric bursitis of right hip 10/03/2020   Tailbone injury 05/05/2020   UTI (urinary tract infection) 05/05/2020   Insomnia 05/05/2020   Leg cramps 02/16/2020   Dysuria 02/16/2020   Right hip impingement syndrome 02/01/2020   Right bicipital tenosynovitis 02/01/2020   Bronchiectasis without complication ([place]) 72/53/6644   Myofascial pain 12/21/2019   Estrogen deficiency 12/10/2019   Obesity (BMI 30-39.9) 12/10/2019   Bronchiectasis with (acute) exacerbation ([place]) 10/23/2019   Elevated LFTs 10/15/2019   Lower abdominal pain 09/01/2019   Neck pain 10/25/2017   Paresthesias in left hand 10/25/2017   Paresthesia of foot, bilateral 04/12/2017   Primary osteoarthritis of both hands 09/27/2016   Primary osteoarthritis of both knees 09/27/2016   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   Nasopharyngitis, 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/2016   S/P total knee replacement 02/13/2016   Chronic pain of both knees 12/06/2015   Globus pharyngeus 05/17/2015   Genetic testing 12/23/2014   Family history of breast cancer    Family history of colon cancer    Family history of pancreatic cancer    History of breast cancer 11/23/2014   Allergy to ACE inhibitors 09/27/2014    Prediabetes 06/23/2013   Muscle spasms of both lower extremities 01/15/2013   Hx of Clostridium difficile infection 10/10/2012   Dyspnea on exertion 04/02/2012   NICM (nonischemic cardiomyopathy) ([place]) 03/07/2012   Post-menopausal 01/11/2012   Atypical chest pain 11/07/2011   Obstructive sleep apnea 04/04/2010   V-tach ([place]) 01/03/2009   ICD  [employer]  Single chamber 01/03/2009   PFO (patent foramen ovale) 09/29/2008   Vitamin D deficiency 08/17/2008   Hypothyroidism 11/18/2006   Hyperlipidemia 11/18/2006   Depression with anxiety 11/18/2006   Essential hypertension 11/18/2006   Mitral valve prolapse 11/18/2006   Cerebral aneurysm 11/18/2006   Allergic rhinitis 11/18/2006   GERD 11/18/2006  Diverticulosis of colon 11/18/2006   Fatty liver 11/18/2006   Fibromyalgia 11/18/2006    REFERRING DIAG: M54.50 (ICD-10-CM) - Left-sided low back pain without sciatica, unspecified chronicity; M25.552 (ICD-10-CM) - Left hip pain  THERAPY DIAG:  Pain in left hip  Muscle weakness (generalized)  Chronic bilateral low back pain without sciatica  Cramp and spasm  Rationale for Evaluation and Treatment Rehabilitation  PERTINENT HISTORY:  Automatic cardioverter/defibrillator; Bilat knee replacements; arthritis; fibromyalgia; depression, High BMI  PRECAUTIONS: None  SUBJECTIVE:                                                                                                                                                                                      SUBJECTIVE STATEMENT:  Pt reports her L hip pain overall is improved. Pt reports her low back is bothering her more than her L hip. Pt reports she is requesting a referral from her pain management MD for the treatment of her neck.  PAIN:  Are you having pain? Yes: NPRS scale: 2/10 for L hip; 4/10 for low back Pain location: L lowback, gluteal, lat hip, groin, and ant thigh Pain description: ache, sharp occasionally,  constant Aggravating factors: Walking, sleeping, in/out of car, bending over Relieving factors: Muscle relaxors, heat pad, tylenol On Eval pain range=6-8/10   OBJECTIVE: (objective measures completed at initial evaluation unless otherwise dated)   Pt reports xrays were taken with [facility] yesterday. He was able to recall there was no fracture, proper spacing for the joint   PATIENT SURVEYS:  FOTO: Perceived function   38%, predicted   55%  09/04/22: 54%    SCREENING FOR RED FLAGS: Bowel or bladder incontinence: No Spinal tumors: No Compression fracture: No   COGNITION: Overall cognitive status: Within functional limits for tasks assessed                          SENSATION: WFL   MUSCLE LENGTH: Hamstrings: Right NT deg; Left NT deg Marcello Moores test: Right NT deg; Left NT deg   POSTURE: increased lumbar lordosis   PALPATION: TTP with increased muscle tightness to the L gluteal region   LUMBAR ROM:    AROM eval  Flexion Mod limited, low back pulling pain  Extension Mod limited, pain low back and L hip  Right lateral flexion Mod limited, pulling pain R hip  Left lateral flexion Mod limited, pain across low back  Right rotation Mod limited, pulling pain R hip and across low back  Left rotation Mod limited, pain across low back   (Blank rows = not tested)   LOWER EXTREMITY ROM:     L  decreased in comparison to R  due to pain Passive  Right eval Left eval  Hip flexion   P  Hip extension   P  Hip abduction   P  Hip adduction      Hip internal rotation   P  Hip external rotation   P  Knee flexion      Knee extension      Ankle dorsiflexion      Ankle plantarflexion      Ankle inversion      Ankle eversion      P=pain  (Blank rows = not tested)   LOWER EXTREMITY MMT:   L LE strength is deceased in comparison to R due to Pain MMT Right eval Left eval  Hip flexion   P  Hip extension   P  Hip abduction   P  Hip adduction   P  Hip internal rotation   P  Hip  external rotation   P  Knee flexion      Knee extension      Ankle dorsiflexion      Ankle plantarflexion      Ankle inversion      Ankle eversion      P=pain  (Blank rows = not tested)   LUMBAR SPECIAL TESTS:  Straight leg raise test: Negative, Slump test: Negative, and SI Compression/distraction test: Negative          FADER L hip positive   FUNCTIONAL TESTS:  5 times sit to stand: 08/17/22: 40.8 sec 09/18/22= 29.2" (low mat, with UE) 2 minute walk test: 08/17/22: 185 Feet    GAIT: Distance walked: 142f Assistive device utilized: Single point cane Level of assistance: Complete Independence Comments: decreased pace   TODAY'S TREATMENT: [facility] Adult PT Treatment:                                                DATE: 09/18/22 Therapeutic Exercise: Nustep 5 mins L6 UE/LE Seated forward roll outs c swiss ball forward and laterally  Seated PPTs c swiis ball press x15 3" Shoulder rows 2x15 GTB Manual Therapy: STM and DTM to the low back paraspinals and QL Skilled palpation to identify TrPs and taut muscle bands Trigger Point Dry Needling Treatment: Pre-treatment instruction: Patient instructed on dry needling rationale, procedures, and possible side effects including pain during treatment (achy,cramping feeling), bruising, drop of blood, lightheadedness, nausea, sweating. Patient Consent Given: Yes Education handout provided: Previously provided Muscles treated: Bilat lumbar paraspinals L1 and L4 Needle size and number: .30x.759m 4 Electrical stimulation performed: No Parameters: N/A Treatment response/outcome: Treatment response: muscle twitch Post-treatment instructions: Patient instructed to expect possible mild to moderate muscle soreness later today and/or tomorrow. Patient instructed in methods to reduce muscle soreness and to continue prescribed HEP. If patient was dry needled over the lung field, patient was instructed on signs and symptoms of pneumothorax and, however  unlikely, to see immediate medical attention should they occur. Patient was also educated on signs and symptoms of infection and to seek medical attention should they occur. Patient verbalized understanding of these instructions and education.   TREATMENT 09/13/22:  Aquatic therapy at [address] - therapeutic pool temp 92 degrees Pt enters building independently.  Treatment took place in water 3.8 to  4 ft 8 in.feet deep depending upon activity.  Pt entered and exited the  pool via stair and handrails    Aquatic Therapy:  Water walking for warm up fwd/lat/bkwds  Standing: Walking march Hamstring curl 2x15 BIL Hip abd/add 2x15 BIL crossing midline Hip Circles CC/CCW 2x10 each BIL Heel/toes raises - 2x15u Hip ext/flex with knee straight x 20 BIL Yellow noodle stomp x20 ea Squats 2x15 Sit to stand - 20x Lunge fwd x20 (not today) Lunge lateral x20 (not today) Step ups on submerged step 2x10 BIL  Pt requires the buoyancy of water for active assisted exercises with buoyancy supported for strengthening and AROM exercises. Hydrostatic pressure also supports joints by unweighting joint load by at least 50 % in 3-4 feet depth water. 80% in chest to neck deep water. Water will provide assistance with movement using the current and laminar flow while the buoyancy reduces weight bearing. Pt requires the viscosity of the water for resistance with strengthening exercises.  [facility] Adult PT Treatment:                                                DATE: 09/11/22 Therapeutic Exercise: Therapeutic Exercise: Nustep L5 UE/LE x 6 minutes STS x10 mat table c airex Lateral paloff steps c YTB x3 8' Forward/backward c RTB x5 8' Backward/forward c RTB x5 8'  Manual Therapy: DTM to the L piriforms and gluteal muscles Skilled palpation to identify TrPs and taut muscle bands  Trigger Point Dry Needling Treatment: Pre-treatment instruction: Patient instructed on dry needling rationale,  procedures, and possible side effects including pain during treatment (achy,cramping feeling), bruising, drop of blood, lightheadedness, nausea, sweating. Patient Consent Given: Yes Education handout provided: Previously provided Muscles treated: L piriformis, glut min, med and max  Needle size and number:  .30x132m Electrical stimulation performed: No Parameters: N/A Treatment response/outcome: Treatment response: muscle twitch Post-treatment instructions: Patient instructed to expect possible mild to moderate muscle soreness later today and/or tomorrow. Patient instructed in methods to reduce muscle soreness and to continue prescribed HEP. If patient was dry needled over the lung field, patient was instructed on signs and symptoms of pneumothorax and, however unlikely, to see immediate medical attention should they occur. Patient was also educated on signs and symptoms of infection and to seek medical attention should they occur. Patient verbalized understanding of these instructions and education.   PATIENT EDUCATION:  Education details: Eval findings, POC, HEP, self care Person educated: Patient, Parent, Spouse, and Child(ren) Education method: Explanation, Demonstration, Tactile cues, Verbal cues, and Handouts Education comprehension: verbalized understanding, returned demonstration, verbal cues required, and tactile cues required   HOME EXERCISE PROGRAM: Access Code: [email or web address] URL: [email or web address] Date: 08/22/2022 Prepared by: AGar Ponto Exercises - Supine Lower Trunk Rotation  - 1-2 x daily - 7 x weekly - 1 sets - 5 reps - 5 hold - Supine Piriformis Stretch with Foot on Ground  - 1-2 x daily - 7 x weekly - 3 reps - 15 hold - Hooklying Single Knee to Chest  - 1-2 x daily - 7 x weekly - 1 sets - 3 reps - 15 hold - Supine Bridge  - 1-2 x daily - 7 x weekly - 1 sets - 10 reps - 3 hold - Hooklying Clamshell with Resistance  - 1 x daily - 7 x weekly - 1 sets - 10  reps - 3 hold - Supine Hip Adduction Isometric with Ball  - 1  x daily - 7 x weekly - 1 sets - 10 reps - 3 hold   ASSESSMENT:   CLINICAL IMPRESSION:.  Pt is making appropriate progress re: L hip pain and function as per subjective report and the 5xSTS. Today, PT focused on the pt's low back pain which recently has started bothering her more than the L hip. Manual therapy was provided to the lumbar paraspinals and QL f/b TPDN to the lumbar paraspinals. Muscle twitch responses were elicited. Pt tolerated PT today without adverse effects. Will assess pt's complete response to today's PT treatment the next session. Pt will continue to benefit from the continuation of skilled PT 2w6 to address impairments for improved function   OBJECTIVE IMPAIRMENTS: decreased activity tolerance, decreased balance, difficulty walking, decreased ROM, decreased strength, increased muscle spasms, obesity, and pain.    ACTIVITY LIMITATIONS: carrying, lifting, bending, sitting, standing, squatting, sleeping, stairs, and locomotion level   PARTICIPATION LIMITATIONS: meal prep, cleaning, laundry, shopping, and community activity   PERSONAL FACTORS: Fitness, Past/current experiences, Time since onset of injury/illness/exacerbation, and 3+ comorbidities:    Mod limited, pulling pain R hipare also affecting patient's functional outcome.    REHAB POTENTIAL: Good   CLINICAL DECISION MAKING: Evolving/moderate complexity   EVALUATION COMPLEXITY: Moderate     GOALS:   SHORT TERM GOALS: Target date: 08/24/22   Pt will be Ind in an initial HEP  Baseline: initiated Status: proper completion Goal status: MET   2.  Pt will voice understanding of measures to assist in pain reduction Baseline: initiated Status: HEP and sleeping positions with support; use of cold pack Goal status: MET   LONG TERM GOALS: Target date: 11/06/22   Pt will be Ind in a final HEP to maintain achieved LOF Baseline: initiated Goal status:  ONGOING   2.  Pt's trunk ROM will improve to minimal or less for improved function and as demonstration of decreased pain Baseline: see flow sheets Goal status: ONGOING   3.  Pt will report a decreased in pain to 3/10 or less with daily activities Baseline: 6-8/10 Status: 09/11/22= 2/10 Goal status: Improved   4.  Pt L hip will demonstrate ROM and strength equal to that of the R hip for improved function Baseline:  Goal status: ONGOING   5. Improve 5xSTS by MCID of 5" and 2MWT by MCID of 32f as indication of improved functional mobility  Baseline: TBA STATUS= 09/18/22= 29.2" (low mat, with UE) Goal status: MET for 5xSTS   6.  Pt's FOTO score will improved to the predicted value of 55% as indication of improved function  Baseline: 38% 09/04/22: 54% Goal status: Improved   PLAN:   PT FREQUENCY: 2x/week   PT DURATION: 6 weeks   PLANNED INTERVENTIONS: Therapeutic exercises, Therapeutic activity, Neuromuscular re-education, Balance training, Gait training, Patient/Family education, Self Care, Joint mobilization, Stair training, Aquatic Therapy, Dry Needling, Electrical stimulation, Spinal manipulation, Spinal mobilization, Cryotherapy, Moist heat, Taping, Traction, Ultrasound, Ionotophoresis '4mg'$ /ml Dexamethasone, Manual therapy, and Re-evaluation.   PLAN FOR NEXT SESSION: FOTO status ; assess response to HEP; progress therex as indicated; use of modalities, manual therapy; and TPDN as indicated. [facility] was given info [facility].  Pt has pacemaker, NO IONTO or ESTIM   Tannar Broker MS, PT 09/18/22 6:04 PM

## 2022-09-19 ENCOUNTER — Encounter: Payer: Self-pay | Admitting: Hematology and Oncology

## 2022-09-19 ENCOUNTER — Inpatient Hospital Stay: Payer: Medicare Other | Attending: Hematology and Oncology | Admitting: Hematology and Oncology

## 2022-09-19 VITALS — BP 148/76 | HR 83 | Temp 97.2°F | Resp 16 | Wt 215.2 lb

## 2022-09-19 DIAGNOSIS — Z8042 Family history of malignant neoplasm of prostate: Secondary | ICD-10-CM | POA: Diagnosis not present

## 2022-09-19 DIAGNOSIS — Z803 Family history of malignant neoplasm of breast: Secondary | ICD-10-CM | POA: Diagnosis not present

## 2022-09-19 DIAGNOSIS — Z801 Family history of malignant neoplasm of trachea, bronchus and lung: Secondary | ICD-10-CM | POA: Insufficient documentation

## 2022-09-19 DIAGNOSIS — I509 Heart failure, unspecified: Secondary | ICD-10-CM | POA: Diagnosis not present

## 2022-09-19 DIAGNOSIS — Z853 Personal history of malignant neoplasm of breast: Secondary | ICD-10-CM

## 2022-09-19 DIAGNOSIS — Z87891 Personal history of nicotine dependence: Secondary | ICD-10-CM | POA: Diagnosis not present

## 2022-09-19 DIAGNOSIS — Z86000 Personal history of in-situ neoplasm of breast: Secondary | ICD-10-CM | POA: Diagnosis not present

## 2022-09-19 DIAGNOSIS — N6310 Unspecified lump in the right breast, unspecified quadrant: Secondary | ICD-10-CM | POA: Insufficient documentation

## 2022-09-19 DIAGNOSIS — Z800 Family history of malignant neoplasm of digestive organs: Secondary | ICD-10-CM | POA: Diagnosis not present

## 2022-09-19 DIAGNOSIS — I110 Hypertensive heart disease with heart failure: Secondary | ICD-10-CM | POA: Insufficient documentation
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
## 2022-09-20 ENCOUNTER — Ambulatory Visit: Payer: Medicare Other | Admitting: Physical Therapy

## 2022-09-20 NOTE — Progress Notes (Signed)
Remote ICD transmission.   

## 2022-09-25 ENCOUNTER — Ambulatory Visit: Payer: Medicare Other | Attending: Family Medicine

## 2022-09-25 DIAGNOSIS — M545 Low back pain, unspecified: Secondary | ICD-10-CM | POA: Diagnosis present

## 2022-09-25 DIAGNOSIS — G8929 Other chronic pain: Secondary | ICD-10-CM | POA: Diagnosis present

## 2022-09-25 DIAGNOSIS — R252 Cramp and spasm: Secondary | ICD-10-CM | POA: Insufficient documentation

## 2022-09-25 DIAGNOSIS — M25552 Pain in left hip: Secondary | ICD-10-CM | POA: Diagnosis present

## 2022-09-25 DIAGNOSIS — M6281 Muscle weakness (generalized): Secondary | ICD-10-CM | POA: Diagnosis present

## 2022-09-25 NOTE — Therapy (Signed)
OUTPATIENT PHYSICAL THERAPY TREATMENT NOTE/Progress Note/Re-Cert   Patient Name: [name] MRN: [id number] DOB:February 11, 1951, 72 y.o., female Today's Date: 09/25/2022  PCP: Camillia Herter, NP   REFERRING PROVIDER: Dorna Mai, MD  See note below for Objective Data and Assessment of Progress/Goals.      END OF SESSION:   PT End of Session - 09/25/22 1350     Visit Number 11    Number of Visits 22    Date for PT Re-Evaluation 11/06/22    Authorization Type MEDICARE PART A AND B    Progress Note Due on Visit 10    PT Start Time 1338    PT Stop Time 1418    PT Time Calculation (min) 40 min    Activity Tolerance Patient tolerated treatment well    Behavior During Therapy WFL for tasks assessed/performed                Past Medical History:  Diagnosis Date   AICD (automatic cardioverter/defibrillator) present    Anxiety    Arthritis    Back pain    Breast cancer ([place]) 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   Bronchiectasis ([place])    Cerebral aneurysm, nonruptured    had a clip put in   CHF (congestive heart failure) (HCC)    Clostridium difficile infection    Depressive disorder, not elsewhere classified    Diverticulosis of colon (without mention of hemorrhage)    Esophageal reflux    Fatty liver    Fibromyalgia    Gastritis    GERD (gastroesophageal reflux disease)    GI bleed 2004   Glaucoma    Hiatal hernia    History of COVID-19 05/04/2021   Hyperlipidemia    Hypertension    Hypothyroidism    Internal hemorrhoids    Joint pain    Multifocal pneumonia 06/03/2021   Obstructive sleep apnea (adult) (pediatric)    Osteoarthritis    Ostium secundum type atrial septal defect    Other chronic nonalcoholic liver disease    Other pulmonary embolism and infarction    Palpitations    Paroxysmal ventricular tachycardia (HCC)    Personal history of radiation therapy    PONV (postoperative nausea and vomiting)    Presence of permanent cardiac  pacemaker    PUD (peptic ulcer disease)    Radiation 02/03/15-03/10/15   Right Breast   Sarcoid    per pt , not sure   Schatzki's ring    Shortness of breath    Sleep apnea    Stroke [facility]) 2013   tia/ pt feels it was around 2008 0r 2009   Takotsubo syndrome    Tubular adenoma of colon    Unspecified transient cerebral ischemia    Unspecified vitamin D deficiency    Past Surgical History:  Procedure Laterality Date   ABI  2006   normal   BREAST EXCISIONAL BIOPSY     BREAST LUMPECTOMY Right 2016   BREAST LUMPECTOMY WITH RADIOACTIVE SEED LOCALIZATION Right 01/03/2015   Procedure: BREAST LUMPECTOMY WITH RADIOACTIVE SEED LOCALIZATION;  Surgeon: Autumn Messing III, MD;  Location: [place];  Service: General;  Laterality: Right;   CARDIAC DEFIBRILLATOR PLACEMENT  2006; 2012   BSX single chamber ICD   carotid dopplers  2006   neg   CEREBRAL ANEURYSM REPAIR  02/1999   COLONOSCOPY     HAMMER TOE SURGERY  11/15/2020   3rd digit bilateral feet    hospitalization  2004  GI bleed, PUD, diverticulosis (EGD,colonscopy)   hospitalization     PE, NSVT, s/p defib   KNEE ARTHROSCOPY     bilateral   LEFT HEART CATHETERIZATION WITH CORONARY ANGIOGRAM N/A 02/22/2012   Procedure: LEFT HEART CATHETERIZATION WITH CORONARY ANGIOGRAM;  Surgeon: Burnell Blanks, MD;  Location: [facility] CATH LAB;  Service: Cardiovascular;  Laterality: N/A;   PARTIAL HYSTERECTOMY     Fibroids   TONGUE BIOPSY  12/12/2017   due to sore tongue and white patches/abnormal cells   TOTAL KNEE ARTHROPLASTY Left 02/13/2016   Procedure: TOTAL KNEE ARTHROPLASTY;  Surgeon: Vickey Huger, MD;  Location: [facility];  Service: Orthopedics;  Laterality: Left;   TOTAL KNEE ARTHROPLASTY Right 05/22/2021   Procedure: TOTAL KNEE ARTHROPLASTY;  Surgeon: Vickey Huger, MD;  Location: [facility];  Service: Orthopedics;  Laterality: Right;  with block   UPPER GASTROINTESTINAL ENDOSCOPY     Patient Active Problem List   Diagnosis Date Noted   Partial  symptomatic epilepsy with complex partial seizures, not intractable, without status epilepticus ([place]) 08/10/2022   Low back pain 08/10/2022   Gait abnormality 05/04/2022   Muscle spasm 05/04/2022   Upper airway cough syndrome 01/19/2022   Chronic left shoulder pain 11/13/2021   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   Localized swelling of right lower leg 08/29/2021   Tonsillar exudate 08/29/2021   Candida vaginitis 08/28/2021   Chronic pain syndrome 08/25/2021   Multifocal pneumonia 06/03/2021   Pre-operative respiratory examination 05/18/2021   Statin myopathy 04/19/2021   Pain in joint of right shoulder 02/10/2021   Arthritis of both acromioclavicular joints 01/02/2021   Trochanteric bursitis of right hip 10/03/2020   Tailbone injury 05/05/2020   UTI (urinary tract infection) 05/05/2020   Insomnia 05/05/2020   Leg cramps 02/16/2020   Dysuria 02/16/2020   Right hip impingement syndrome 02/01/2020   Right bicipital tenosynovitis 02/01/2020   Bronchiectasis without complication ([place]) 62/13/0865   Myofascial pain 12/21/2019   Estrogen deficiency 12/10/2019   Obesity (BMI 30-39.9) 12/10/2019   Bronchiectasis with (acute) exacerbation ([place]) 10/23/2019   Elevated LFTs 10/15/2019   Lower abdominal pain 09/01/2019   Neck pain 10/25/2017   Paresthesias in left hand 10/25/2017   Paresthesia of foot, bilateral 04/12/2017   Primary osteoarthritis of both hands 09/27/2016   Primary osteoarthritis of both knees 09/27/2016   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   Nasopharyngitis, 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/2016   S/P total knee replacement 02/13/2016   Chronic pain of both knees 12/06/2015   Globus pharyngeus 05/17/2015   Genetic testing 12/23/2014   Family history of breast cancer    Family history of colon cancer    Family history of pancreatic cancer    History of breast cancer 11/23/2014   Allergy to ACE inhibitors 09/27/2014   Prediabetes 06/23/2013   Muscle spasms of both lower  extremities 01/15/2013   Hx of Clostridium difficile infection 10/10/2012   Dyspnea on exertion 04/02/2012   NICM (nonischemic cardiomyopathy) ([place]) 03/07/2012   Post-menopausal 01/11/2012   Atypical chest pain 11/07/2011   Obstructive sleep apnea 04/04/2010   V-tach ([place]) 01/03/2009   ICD  [employer]  Single chamber 01/03/2009   PFO (patent foramen ovale) 09/29/2008   Vitamin D deficiency 08/17/2008   Hypothyroidism 11/18/2006   Hyperlipidemia 11/18/2006   Depression with anxiety 11/18/2006   Essential hypertension 11/18/2006   Mitral valve prolapse 11/18/2006   Cerebral aneurysm 11/18/2006   Allergic rhinitis 11/18/2006   GERD 11/18/2006   Diverticulosis of colon 11/18/2006   Fatty liver 11/18/2006  Fibromyalgia 11/18/2006    REFERRING DIAG: M54.50 (ICD-10-CM) - Left-sided low back pain without sciatica, unspecified chronicity; M25.552 (ICD-10-CM) - Left hip pain  THERAPY DIAG:  Pain in left hip  Muscle weakness (generalized)  Chronic bilateral low back pain without sciatica  Cramp and spasm  Rationale for Evaluation and Treatment Rehabilitation  PERTINENT HISTORY:  Automatic cardioverter/defibrillator; Bilat knee replacements; arthritis; fibromyalgia; depression, High BMI  PRECAUTIONS: Pacemaker  SUBJECTIVE:                                                                                                                                                                                      SUBJECTIVE STATEMENT:  Pt reports her low back is feeling a little better.  PAIN:  Are you having pain? Yes: NPRS scale: 2/10 for L hip; 3/10 for low back Pain location: L lowback, gluteal, lat hip, groin, and ant thigh Pain description: ache, sharp occasionally, constant Aggravating factors: Walking, sleeping, in/out of car, bending over Relieving factors: Muscle relaxors, heat pad, tylenol On Eval pain range=6-8/10   OBJECTIVE: (objective measures completed at  initial evaluation unless otherwise dated)   Pt reports xrays were taken with [facility] yesterday. He was able to recall there was no fracture, proper spacing for the joint   PATIENT SURVEYS:  FOTO: Perceived function   38%, predicted   55%  09/04/22: 54%    SCREENING FOR RED FLAGS: Bowel or bladder incontinence: No Spinal tumors: No Compression fracture: No   COGNITION: Overall cognitive status: Within functional limits for tasks assessed                          SENSATION: WFL   MUSCLE LENGTH: Hamstrings: Right NT deg; Left NT deg Marcello Moores test: Right NT deg; Left NT deg   POSTURE: increased lumbar lordosis   PALPATION: TTP with increased muscle tightness to the L gluteal region   LUMBAR ROM:    AROM eval  Flexion Mod limited, low back pulling pain  Extension Mod limited, pain low back and L hip  Right lateral flexion Mod limited, pulling pain R hip  Left lateral flexion Mod limited, pain across low back  Right rotation Mod limited, pulling pain R hip and across low back  Left rotation Mod limited, pain across low back   (Blank rows = not tested)   LOWER EXTREMITY ROM:     L decreased in comparison to R  due to pain Passive  Right eval Left eval  Hip flexion   P  Hip extension   P  Hip abduction   P  Hip adduction  Hip internal rotation   P  Hip external rotation   P  Knee flexion      Knee extension      Ankle dorsiflexion      Ankle plantarflexion      Ankle inversion      Ankle eversion      P=pain  (Blank rows = not tested)   LOWER EXTREMITY MMT:   L LE strength is deceased in comparison to R due to Pain MMT Right eval Left eval  Hip flexion   P  Hip extension   P  Hip abduction   P  Hip adduction   P  Hip internal rotation   P  Hip external rotation   P  Knee flexion      Knee extension      Ankle dorsiflexion      Ankle plantarflexion      Ankle inversion      Ankle eversion      P=pain  (Blank rows = not tested)   LUMBAR SPECIAL  TESTS:  Straight leg raise test: Negative, Slump test: Negative, and SI Compression/distraction test: Negative          FADER L hip positive   FUNCTIONAL TESTS:  5 times sit to stand: 08/17/22: 40.8 sec 09/18/22= 29.2" (low mat, with UE) 2 minute walk test: 08/17/22: 185 Feet    GAIT: Distance walked: 137f Assistive device utilized: Single point cane Level of assistance: Complete Independence Comments: decreased pace   TODAY'S TREATMENT: [facility] Adult PT Treatment:                                                DATE: 09/25/22 Therapeutic Exercise: Nustep 5 mins L6 LE Seated forward roll outs c swiss ball forward and laterally  Seated PPTs c swiis ball press x15 3" PPT x15 3" Abdominal bracing x10 5" LTR x10 SKTC x5 10"  [facility] Adult PT Treatment:                                                DATE: 09/18/22 Therapeutic Exercise: Nustep 5 mins L6 UE/LE Seated forward roll outs c swiss ball forward and laterally  Seated PPTs c swiis ball press x15 3" Shoulder rows 2x15 GTB Manual Therapy: STM and DTM to the low back paraspinals and QL Skilled palpation to identify TrPs and taut muscle bands Trigger Point Dry Needling Treatment: Pre-treatment instruction: Patient instructed on dry needling rationale, procedures, and possible side effects including pain during treatment (achy,cramping feeling), bruising, drop of blood, lightheadedness, nausea, sweating. Patient Consent Given: Yes Education handout provided: Previously provided Muscles treated: Bilat lumbar paraspinals L1 and L4 Needle size and number: .30x.739m 4 Electrical stimulation performed: No Parameters: N/A Treatment response/outcome: Treatment response: muscle twitch Post-treatment instructions: Patient instructed to expect possible mild to moderate muscle soreness later today and/or tomorrow. Patient instructed in methods to reduce muscle soreness and to continue prescribed HEP. If patient was dry needled over the lung  field, patient was instructed on signs and symptoms of pneumothorax and, however unlikely, to see immediate medical attention should they occur. Patient was also educated on signs and symptoms of infection and to seek medical attention should they occur. Patient verbalized  understanding of these instructions and education.   TREATMENT 09/13/22:  Aquatic therapy at [address] - therapeutic pool temp 92 degrees Pt enters building independently.  Treatment took place in water 3.8 to  4 ft 8 in.feet deep depending upon activity.  Pt entered and exited the pool via stair and handrails    Aquatic Therapy:  Water walking for warm up fwd/lat/bkwds  Standing: Walking march Hamstring curl 2x15 BIL Hip abd/add 2x15 BIL crossing midline Hip Circles CC/CCW 2x10 each BIL Heel/toes raises - 2x15u Hip ext/flex with knee straight x 20 BIL Yellow noodle stomp x20 ea Squats 2x15 Sit to stand - 20x Lunge fwd x20 (not today) Lunge lateral x20 (not today) Step ups on submerged step 2x10 BIL  Pt requires the buoyancy of water for active assisted exercises with buoyancy supported for strengthening and AROM exercises. Hydrostatic pressure also supports joints by unweighting joint load by at least 50 % in 3-4 feet depth water. 80% in chest to neck deep water. Water will provide assistance with movement using the current and laminar flow while the buoyancy reduces weight bearing. Pt requires the viscosity of the water for resistance with strengthening exercises.  PATIENT EDUCATION:  Education details: Eval findings, POC, HEP, self care Person educated: Patient, Parent, Spouse, and Child(ren) Education method: Explanation, Demonstration, Tactile cues, Verbal cues, and Handouts Education comprehension: verbalized understanding, returned demonstration, verbal cues required, and tactile cues required   HOME EXERCISE PROGRAM: Access Code: [email or web address] URL: [email or web address] Date:  09/25/2022 Prepared by: Gar Ponto  Exercises - Supine Lower Trunk Rotation  - 1-2 x daily - 7 x weekly - 1 sets - 5 reps - 5 hold - Supine Piriformis Stretch with Foot on Ground  - 1-2 x daily - 7 x weekly - 3 reps - 15 hold - Hooklying Single Knee to Chest  - 1-2 x daily - 7 x weekly - 1 sets - 3 reps - 15 hold - Supine Bridge  - 1-2 x daily - 7 x weekly - 1 sets - 10 reps - 3 hold - Hooklying Clamshell with Resistance  - 1 x daily - 7 x weekly - 1 sets - 10 reps - 3 hold - Supine Hip Adduction Isometric with Ball  - 1 x daily - 7 x weekly - 1 sets - 10 reps - 3 hold - Seated Flexion Stretch with Swiss Ball  - 1 x daily - 7 x weekly - 1 sets - 6 reps - 20 hold - Seated Abdominal Press into [employer]  - 1 x daily - 7 x weekly - 1 sets - 10 reps - 3 hold - Standing Hip Flexor Stretch  - 1 x daily - 7 x weekly - 1 sets - 10 reps - 20 hold - Supine Posterior Pelvic Tilt  - 1 x daily - 7 x weekly - 1 sets - 10 reps - 3 hold   ASSESSMENT:   CLINICAL IMPRESSION:.  PT was completed for lumbopelvic flexibility and strengthening focused on impairments associated with LBP. Pt presents with an anterior pelvic tilt and increased lumbar lordosis. Pt tolerated PT today without adverse effects.  Will continue skilled PT to address impairments for improved function with decreased L hip and low back pain Will reassess 2MWT the next PT session.  OBJECTIVE IMPAIRMENTS: decreased activity tolerance, decreased balance, difficulty walking, decreased ROM, decreased strength, increased muscle spasms, obesity, and pain.    ACTIVITY LIMITATIONS: carrying, lifting, bending, sitting, standing, squatting, sleeping, stairs, and  locomotion level   PARTICIPATION LIMITATIONS: meal prep, cleaning, laundry, shopping, and community activity   PERSONAL FACTORS: Fitness, Past/current experiences, Time since onset of injury/illness/exacerbation, and 3+ comorbidities:    Mod limited, pulling pain R hipare also affecting  patient's functional outcome.    REHAB POTENTIAL: Good   CLINICAL DECISION MAKING: Evolving/moderate complexity   EVALUATION COMPLEXITY: Moderate     GOALS:   SHORT TERM GOALS: Target date: 08/24/22   Pt will be Ind in an initial HEP  Baseline: initiated Status: proper completion Goal status: MET   2.  Pt will voice understanding of measures to assist in pain reduction Baseline: initiated Status: HEP and sleeping positions with support; use of cold pack Goal status: MET   LONG TERM GOALS: Target date: 11/06/22   Pt will be Ind in a final HEP to maintain achieved LOF Baseline: initiated Goal status: ONGOING   2.  Pt's trunk ROM will improve to minimal or less for improved function and as demonstration of decreased pain Baseline: see flow sheets Goal status: ONGOING   3.  Pt will report a decreased in pain to 3/10 or less with daily activities Baseline: 6-8/10 Status: 09/11/22= 2/10 Goal status: Improved   4.  Pt L hip will demonstrate ROM and strength equal to that of the R hip for improved function Baseline:  Goal status: ONGOING   5. Improve 5xSTS by MCID of 5" and 2MWT by MCID of 76f as indication of improved functional mobility  Baseline: TBA STATUS= 09/18/22= 29.2" (low mat, with UE) Goal status: MET for 5xSTS   6.  Pt's FOTO score will improved to the predicted value of 55% as indication of improved function  Baseline: 38% 09/04/22: 54% Goal status: Improved   PLAN:   PT FREQUENCY: 2x/week   PT DURATION: 6 weeks   PLANNED INTERVENTIONS: Therapeutic exercises, Therapeutic activity, Neuromuscular re-education, Balance training, Gait training, Patient/Family education, Self Care, Joint mobilization, Stair training, Aquatic Therapy, Dry Needling, Electrical stimulation, Spinal manipulation, Spinal mobilization, Cryotherapy, Moist heat, Taping, Traction, Ultrasound, Ionotophoresis '4mg'$ /ml Dexamethasone, Manual therapy, and Re-evaluation.   PLAN FOR NEXT SESSION:  FOTO status ; assess response to HEP; progress therex as indicated; use of modalities, manual therapy; and TPDN as indicated. [facility] was given info sheet.  Pt has pacemaker, NO IONTO or ESTIM   Shraga Custard MS, PT 09/25/22 3:41 PM

## 2022-09-27 ENCOUNTER — Ambulatory Visit
Admission: RE | Admit: 2022-09-27 | Discharge: 2022-09-27 | Disposition: A | Discharge status: Home / Self Care | Payer: Medicare Other | Source: Ambulatory Visit | Attending: Hematology and Oncology | Admitting: Hematology and Oncology

## 2022-09-27 ENCOUNTER — Other Ambulatory Visit: Payer: Self-pay | Admitting: Family Medicine

## 2022-09-27 DIAGNOSIS — Z853 Personal history of malignant neoplasm of breast: Secondary | ICD-10-CM

## 2022-09-27 DIAGNOSIS — M25552 Pain in left hip: Secondary | ICD-10-CM

## 2022-09-27 DIAGNOSIS — M545 Low back pain, unspecified: Secondary | ICD-10-CM

## 2022-10-02 ENCOUNTER — Ambulatory Visit: Payer: Medicare Other | Admitting: Physical Therapy

## 2022-10-02 DIAGNOSIS — M25552 Pain in left hip: Secondary | ICD-10-CM | POA: Diagnosis not present

## 2022-10-02 DIAGNOSIS — M6281 Muscle weakness (generalized): Secondary | ICD-10-CM

## 2022-10-02 DIAGNOSIS — G8929 Other chronic pain: Secondary | ICD-10-CM

## 2022-10-02 NOTE — Therapy (Signed)
OUTPATIENT PHYSICAL THERAPY TREATMENT NOTE   Patient Name: [name] MRN: [id number] DOB:[birth year], 72 y.o., female Today's Date: 10/02/2022  PCP: Camillia Herter, NP   REFERRING PROVIDER: Dorna Mai, MD  See note below for Objective Data and Assessment of Progress/Goals.      END OF SESSION:   PT End of Session - 10/02/22 0849     Visit Number 12    Number of Visits 22    Date for PT Re-Evaluation 11/06/22    Authorization Type MEDICARE PART A AND B    Progress Note Due on Visit 10    PT Start Time 0849    PT Stop Time 0928    PT Time Calculation (min) 39 min                Past Medical History:  Diagnosis Date   AICD (automatic cardioverter/defibrillator) present    Anxiety    Arthritis    Back pain    Breast cancer ([place]) 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   Bronchiectasis ([place])    Cerebral aneurysm, nonruptured    had a clip put in   CHF (congestive heart failure) (HCC)    Clostridium difficile infection    Depressive disorder, not elsewhere classified    Diverticulosis of colon (without mention of hemorrhage)    Esophageal reflux    Fatty liver    Fibromyalgia    Gastritis    GERD (gastroesophageal reflux disease)    GI bleed 2004   Glaucoma    Hiatal hernia    History of COVID-19 05/04/2021   Hyperlipidemia    Hypertension    Hypothyroidism    Internal hemorrhoids    Joint pain    Multifocal pneumonia 06/03/2021   Obstructive sleep apnea (adult) (pediatric)    Osteoarthritis    Ostium secundum type atrial septal defect    Other chronic nonalcoholic liver disease    Other pulmonary embolism and infarction    Palpitations    Paroxysmal ventricular tachycardia (HCC)    Personal history of radiation therapy    PONV (postoperative nausea and vomiting)    Presence of permanent cardiac pacemaker    PUD (peptic ulcer disease)    Radiation 02/03/15-03/10/15   Right Breast   Sarcoid    per pt , not sure   Schatzki's ring     Shortness of breath    Sleep apnea    Stroke [facility]) 2013   tia/ pt feels it was around 2008 0r 2009   Takotsubo syndrome    Tubular adenoma of colon    Unspecified transient cerebral ischemia    Unspecified vitamin D deficiency    Past Surgical History:  Procedure Laterality Date   ABI  2006   normal   BREAST EXCISIONAL BIOPSY     BREAST LUMPECTOMY Right 2016   BREAST LUMPECTOMY WITH RADIOACTIVE SEED LOCALIZATION Right 01/03/2015   Procedure: BREAST LUMPECTOMY WITH RADIOACTIVE SEED LOCALIZATION;  Surgeon: Autumn Messing III, MD;  Location: [place];  Service: General;  Laterality: Right;   CARDIAC DEFIBRILLATOR PLACEMENT  2006; 2012   BSX single chamber ICD   carotid dopplers  2006   neg   CEREBRAL ANEURYSM REPAIR  02/1999   COLONOSCOPY     HAMMER TOE SURGERY  11/15/2020   3rd digit bilateral feet    hospitalization  2004   GI bleed, PUD, diverticulosis (EGD,colonscopy)   hospitalization     PE, NSVT, s/p defib   KNEE  ARTHROSCOPY     bilateral   LEFT HEART CATHETERIZATION WITH CORONARY ANGIOGRAM N/A 02/22/2012   Procedure: LEFT HEART CATHETERIZATION WITH CORONARY ANGIOGRAM;  Surgeon: Burnell Blanks, MD;  Location: [facility] CATH LAB;  Service: Cardiovascular;  Laterality: N/A;   PARTIAL HYSTERECTOMY     Fibroids   TONGUE BIOPSY  12/12/2017   due to sore tongue and white patches/abnormal cells   TOTAL KNEE ARTHROPLASTY Left 02/13/2016   Procedure: TOTAL KNEE ARTHROPLASTY;  Surgeon: Vickey Huger, MD;  Location: [place];  Service: Orthopedics;  Laterality: Left;   TOTAL KNEE ARTHROPLASTY Right 05/22/2021   Procedure: TOTAL KNEE ARTHROPLASTY;  Surgeon: Vickey Huger, MD;  Location: [facility];  Service: Orthopedics;  Laterality: Right;  with block   UPPER GASTROINTESTINAL ENDOSCOPY     Patient Active Problem List   Diagnosis Date Noted   Partial symptomatic epilepsy with complex partial seizures, not intractable, without status epilepticus ([place]) 08/10/2022   Low back pain 08/10/2022    Gait abnormality 05/04/2022   Muscle spasm 05/04/2022   Upper airway cough syndrome 01/19/2022   Chronic left shoulder pain 11/13/2021   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   Localized swelling of right lower leg 08/29/2021   Tonsillar exudate 08/29/2021   Candida vaginitis 08/28/2021   Chronic pain syndrome 08/25/2021   Multifocal pneumonia 06/03/2021   Pre-operative respiratory examination 05/18/2021   Statin myopathy 04/19/2021   Pain in joint of right shoulder 02/10/2021   Arthritis of both acromioclavicular joints 01/02/2021   Trochanteric bursitis of right hip 10/03/2020   Tailbone injury 05/05/2020   UTI (urinary tract infection) 05/05/2020   Insomnia 05/05/2020   Leg cramps 02/16/2020   Dysuria 02/16/2020   Right hip impingement syndrome 02/01/2020   Right bicipital tenosynovitis 02/01/2020   Bronchiectasis without complication ([place]) 0000000   Myofascial pain 12/21/2019   Estrogen deficiency 12/10/2019   Obesity (BMI 30-39.9) 12/10/2019   Bronchiectasis with (acute) exacerbation ([place]) 10/23/2019   Elevated LFTs 10/15/2019   Lower abdominal pain 09/01/2019   Neck pain 10/25/2017   Paresthesias in left hand 10/25/2017   Paresthesia of foot, bilateral 04/12/2017   Primary osteoarthritis of both hands 09/27/2016   Primary osteoarthritis of both knees 09/27/2016   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   Nasopharyngitis, 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/2016   S/P total knee replacement 02/13/2016   Chronic pain of both knees 12/06/2015   Globus pharyngeus 05/17/2015   Genetic testing 12/23/2014   Family history of breast cancer    Family history of colon cancer    Family history of pancreatic cancer    History of breast cancer 11/23/2014   Allergy to ACE inhibitors 09/27/2014   Prediabetes 06/23/2013   Muscle spasms of both lower extremities 01/15/2013   Hx of Clostridium difficile infection 10/10/2012   Dyspnea on exertion 04/02/2012   NICM (nonischemic cardiomyopathy) ([place])  03/07/2012   Post-menopausal 01/11/2012   Atypical chest pain 11/07/2011   Obstructive sleep apnea 04/04/2010   V-tach ([place]) 01/03/2009   ICD  [employer]  Single chamber 01/03/2009   PFO (patent foramen ovale) 09/29/2008   Vitamin D deficiency 08/17/2008   Hypothyroidism 11/18/2006   Hyperlipidemia 11/18/2006   Depression with anxiety 11/18/2006   Essential hypertension 11/18/2006   Mitral valve prolapse 11/18/2006   Cerebral aneurysm 11/18/2006   Allergic rhinitis 11/18/2006   GERD 11/18/2006   Diverticulosis of colon 11/18/2006   Fatty liver 11/18/2006   Fibromyalgia 11/18/2006    REFERRING DIAG: M54.50 (ICD-10-CM) - Left-sided low back pain without sciatica, unspecified  chronicity; M25.552 (ICD-10-CM) - Left hip pain  THERAPY DIAG:  Pain in left hip  Muscle weakness (generalized)  Chronic bilateral low back pain without sciatica  Rationale for Evaluation and Treatment Rehabilitation  PERTINENT HISTORY:  Automatic cardioverter/defibrillator; Bilat knee replacements; arthritis; fibromyalgia; depression, High BMI  PRECAUTIONS: Pacemaker  SUBJECTIVE:                                                                                                                                                                                      SUBJECTIVE STATEMENT:  Pt reports her low back is feeling a little better.  PAIN:  Are you having pain? Yes: NPRS scale: 2/10 for L hip; 3/10 for low back Pain location: L lowback, gluteal, lat hip, groin, and ant thigh Pain description: ache, sharp occasionally, constant Aggravating factors: Walking, sleeping, in/out of car, bending over Relieving factors: Muscle relaxors, heat pad, tylenol On Eval pain range=6-8/10   OBJECTIVE: (objective measures completed at initial evaluation unless otherwise dated)   Pt reports xrays were taken with [facility] yesterday. He was able to recall there was no fracture, proper spacing for the joint    PATIENT SURVEYS:  FOTO: Perceived function   38%, predicted   55%  09/04/22: 54%  10/02/22: 54%   SCREENING FOR RED FLAGS: Bowel or bladder incontinence: No Spinal tumors: No Compression fracture: No   COGNITION: Overall cognitive status: Within functional limits for tasks assessed                          SENSATION: WFL   MUSCLE LENGTH: Hamstrings: Right NT deg; Left NT deg Marcello Moores test: Right NT deg; Left NT deg   POSTURE: increased lumbar lordosis   PALPATION: TTP with increased muscle tightness to the L gluteal region   LUMBAR ROM:    AROM eval  Flexion Mod limited, low back pulling pain  Extension Mod limited, pain low back and L hip  Right lateral flexion Mod limited, pulling pain R hip  Left lateral flexion Mod limited, pain across low back  Right rotation Mod limited, pulling pain R hip and across low back  Left rotation Mod limited, pain across low back   (Blank rows = not tested)   LOWER EXTREMITY ROM:     L decreased in comparison to R  due to pain Passive  Right eval Left eval Left 10/02/22  Hip flexion   P WFL  Hip extension   P   Hip abduction   P WFL  Hip adduction       Hip internal rotation   P WFL  Hip external rotation  P WFL  Knee flexion       Knee extension       Ankle dorsiflexion       Ankle plantarflexion       Ankle inversion       Ankle eversion       P=pain  (Blank rows = not tested)   LOWER EXTREMITY MMT:   L LE strength is deceased in comparison to R due to Pain MMT Right eval Left eval Right 10/02/22 Left 10/02/22  Hip flexion   P 4+ 4-  Hip extension   P    Hip abduction   P 4- 4  Hip adduction   P    Hip internal rotation   P    Hip external rotation   P    Knee flexion        Knee extension        Ankle dorsiflexion        Ankle plantarflexion        Ankle inversion        Ankle eversion        P=pain  (Blank rows = not tested)   LUMBAR SPECIAL TESTS:  Straight leg raise test: Negative, Slump test:  Negative, and SI Compression/distraction test: Negative          FADER L hip positive   FUNCTIONAL TESTS:  5 times sit to stand: 08/17/22: 40.8 sec 09/18/22= 29.2" (low mat, with UE): 10/02/22: 23.3 sec without UE from standard mat 2 minute walk test: 08/17/22: 185 Feet : 10/02/22: 308 feet    GAIT: Distance walked: 13f Assistive device utilized: Single point cane Level of assistance: Complete Independence Comments: decreased pace   TODAY'S TREATMENT: [facility] Adult PT Treatment:                                                DATE: 10/02/22 Therapeutic Exercise: Seated blue band clam Seated march blue band  Hip abduction x 10 each   Therapeutic Activity: 5 x STS 2 MWT MMT   [facility] Adult PT Treatment:                                                DATE: 09/25/22 Therapeutic Exercise: Nustep 5 mins L6 LE Seated forward roll outs c swiss ball forward and laterally  Seated PPTs c swiis ball press x15 3" PPT x15 3" Abdominal bracing x10 5" LTR x10 SKTC x5 10"  [facility] Adult PT Treatment:                                                DATE: 09/18/22 Therapeutic Exercise: Nustep 5 mins L6 UE/LE Seated forward roll outs c swiss ball forward and laterally  Seated PPTs c swiis ball press x15 3" Shoulder rows 2x15 GTB Manual Therapy: STM and DTM to the low back paraspinals and QL Skilled palpation to identify TrPs and taut muscle bands Trigger Point Dry Needling Treatment: Pre-treatment instruction: Patient instructed on dry needling rationale, procedures, and possible side effects including pain during treatment (achy,cramping feeling), bruising, drop of blood, lightheadedness,  nausea, sweating. Patient Consent Given: Yes Education handout provided: Previously provided Muscles treated: Bilat lumbar paraspinals L1 and L4 Needle size and number: .30x.44m, 4 Electrical stimulation performed: No Parameters: N/A Treatment response/outcome: Treatment response: muscle twitch Post-treatment  instructions: Patient instructed to expect possible mild to moderate muscle soreness later today and/or tomorrow. Patient instructed in methods to reduce muscle soreness and to continue prescribed HEP. If patient was dry needled over the lung field, patient was instructed on signs and symptoms of pneumothorax and, however unlikely, to see immediate medical attention should they occur. Patient was also educated on signs and symptoms of infection and to seek medical attention should they occur. Patient verbalized understanding of these instructions and education.   TREATMENT 09/13/22:  [address] therapeutic pool temp 92 degrees Pt enters building independently.  Treatment took place in water 3.8 to  4 ft 8 in.feet deep depending upon activity.  Pt entered and exited the pool via stair and handrails    Aquatic Therapy:  Water walking for warm up fwd/lat/bkwds  Standing: Walking march Hamstring curl 2x15 BIL Hip abd/add 2x15 BIL crossing midline Hip Circles CC/CCW 2x10 each BIL Heel/toes raises - 2x15u Hip ext/flex with knee straight x 20 BIL Yellow noodle stomp x20 ea Squats 2x15 Sit to stand - 20x Lunge fwd x20 (not today) Lunge lateral x20 (not today) Step ups on submerged step 2x10 BIL  Pt requires the buoyancy of water for active assisted exercises with buoyancy supported for strengthening and AROM exercises. Hydrostatic pressure also supports joints by unweighting joint load by at least 50 % in 3-4 feet depth water. 80% in chest to neck deep water. Water will provide assistance with movement using the current and laminar flow while the buoyancy reduces weight bearing. Pt requires the viscosity of the water for resistance with strengthening exercises.  PATIENT EDUCATION:  Education details: Eval findings, POC, HEP, self care Person educated: Patient, Parent, Spouse, and Child(ren) Education method: Explanation, Demonstration, Tactile cues,  Verbal cues, and Handouts Education comprehension: verbalized understanding, returned demonstration, verbal cues required, and tactile cues required   HOME EXERCISE PROGRAM: Access Code: [email or web address] URL: [email or web address] Date: 09/25/2022 Prepared by: AGar Ponto Exercises - Supine Lower Trunk Rotation  - 1-2 x daily - 7 x weekly - 1 sets - 5 reps - 5 hold - Supine Piriformis Stretch with Foot on Ground  - 1-2 x daily - 7 x weekly - 3 reps - 15 hold - Hooklying Single Knee to Chest  - 1-2 x daily - 7 x weekly - 1 sets - 3 reps - 15 hold - Supine Bridge  - 1-2 x daily - 7 x weekly - 1 sets - 10 reps - 3 hold - Hooklying Clamshell with Resistance  - 1 x daily - 7 x weekly - 1 sets - 10 reps - 3 hold - Supine Hip Adduction Isometric with Ball  - 1 x daily - 7 x weekly - 1 sets - 10 reps - 3 hold - Seated Flexion Stretch with Swiss Ball  - 1 x daily - 7 x weekly - 1 sets - 6 reps - 20 hold - Seated Abdominal Press into [employer] - 1 x daily - 7 x weekly - 1 sets - 10 reps - 3 hold - Standing Hip Flexor Stretch  - 1 x daily - 7 x weekly - 1 sets - 10 reps - 20 hold - Supine Posterior Pelvic Tilt  -  1 x daily - 7 x weekly - 1 sets - 10 reps - 3 hold Added 10/02/22:  - Seated March with Resistance  - 1 x daily - 7 x weekly - 3 sets - 10 reps - seated clam with resistance band  - 1 x daily - 7 x weekly - 3 sets - 10 reps   ASSESSMENT:   CLINICAL IMPRESSION:.  PT was completed for lumbopelvic flexibility and strengthening focused on impairments associated with hip weakness. Her hip PROM is fairly equal with min pain. Her strength is weak Left compared to right hip. Pt was given seated hip strength due to visiting her son in hospital.  Pt tolerated PT today without adverse effects. 2 MWT time improved. LTG#5 met.  Will continue skilled PT to address impairments for improved function with decreased L hip and low back pain.   OBJECTIVE IMPAIRMENTS: decreased activity tolerance,  decreased balance, difficulty walking, decreased ROM, decreased strength, increased muscle spasms, obesity, and pain.    ACTIVITY LIMITATIONS: carrying, lifting, bending, sitting, standing, squatting, sleeping, stairs, and locomotion level   PARTICIPATION LIMITATIONS: meal prep, cleaning, laundry, shopping, and community activity   PERSONAL FACTORS: Fitness, Past/current experiences, Time since onset of injury/illness/exacerbation, and 3+ comorbidities:    Mod limited, pulling pain R hipare also affecting patient's functional outcome.    REHAB POTENTIAL: Good   CLINICAL DECISION MAKING: Evolving/moderate complexity   EVALUATION COMPLEXITY: Moderate     GOALS:   SHORT TERM GOALS: Target date: 08/24/22   Pt will be Ind in an initial HEP  Baseline: initiated Status: proper completion Goal status: MET   2.  Pt will voice understanding of measures to assist in pain reduction Baseline: initiated Status: HEP and sleeping positions with support; use of cold pack Goal status: MET   LONG TERM GOALS: Target date: 11/06/22   Pt will be Ind in a final HEP to maintain achieved LOF Baseline: initiated Goal status: ONGOING   2.  Pt's trunk ROM will improve to minimal or less for improved function and as demonstration of decreased pain Baseline: see flow sheets Goal status: NOT ASSESSED   3.  Pt will report a decreased in pain to 3/10 or less with daily activities Baseline: 6-8/10 Status: 09/11/22= 2/10 Goal status: Improved   4.  Pt L hip will demonstrate ROM and strength equal to that of the R hip for improved function Baseline: pain with all passive movements 10/02/22: PROM equal bilateral with min stretch , left hip weak compared to right Goal status: PARTIALLY MET   5. Improve 5xSTS by MCID of 5" and 2MWT by MCID of 32f as indication of improved functional mobility  Baseline: TBA STATUS= 09/18/22= 29.2" (low mat, with UE) Goal status: MET for 5xSTS and 2 MWT   6.  Pt's FOTO score  will improved to the predicted value of 55% as indication of improved function  Baseline: 38% 09/04/22: 54% Goal status: Improved   PLAN:   PT FREQUENCY: 2x/week   PT DURATION: 6 weeks   PLANNED INTERVENTIONS: Therapeutic exercises, Therapeutic activity, Neuromuscular re-education, Balance training, Gait training, Patient/Family education, Self Care, Joint mobilization, Stair training, Aquatic Therapy, Dry Needling, Electrical stimulation, Spinal manipulation, Spinal mobilization, Cryotherapy, Moist heat, Taping, Traction, Ultrasound, Ionotophoresis 423mml Dexamethasone, Manual therapy, and Re-evaluation.   PLAN FOR NEXT SESSION: ; check Lumbar AROM for completion of goals;   Pt has pacemaker, NO IONTO or ESWeston AnnaPTA 10/02/22 12:58 PM Phone: [phone]: [phone]

## 2022-10-03 ENCOUNTER — Encounter: Payer: Medicare Other | Admitting: Neurology

## 2022-10-04 NOTE — Progress Notes (Signed)
Cardiology Office Note:    Date:  10/12/2022   ID:  [name], DOB 12-24-1950, MRN [id number]  PCP:  Dorna Mai, MD   [facility] Providers Cardiologist:  Larae Grooms, MD Electrophysiologist:  Virl Axe, MD     Referring MD: Dorna Mai, MD   Chief Complaint: annual follow-up hypertension, NICM  History of Present Illness:    [name] is a pleasant 72 y.o. 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   In 2013 ejection fraction was listed at 35 to 40%. She underwent AICD implant. EF normalized in 2018. No CAD by cath in approximately 2006. Normal NST 03/2020. She had ophthalmology artery aneurysm repaired in 2000.  Zio patch monitor 11/2019 for symptoms of palpitations. Symptoms associated mostly with PVCs, also with sinus. NSVT 6 episodes fastest 141 bpm for 6 beats, longest 7 beats at 100 bpm.  Admission for evaluation of shortness of breath October 2022. Had knee arthroplasty a few weeks prior. CT was negative for PE. She was treated for possible pneumonia/chronic interstitial lung disease/respiratory bronchiolitis.  At office visit with Dr. Irish Lack 09/22/2021 she reported some mild chest pain in the middle of her chest since COVID/pneumonia and 05/2021. Chest pain was not related to exertion.  It would happen randomly and resolve spontaneously. Was having tachycardia, DOE with minimal activity after knee surgery. Encouraged to increase activity to achieve 150 minutes moderate intensity exercise weekly. One year follow-up was recommended.   Reported alopecia to Dr. Caryl Comes at office visit 11/20/2021.  She was weaned off her beta-blocker and changed to diltiazem.   Referred to Pharm D for management of hyperlipidemia. Intolerant of rosuvastatin and atorvastatin. Simvastatin elevated CK.  Nexlizet was cost prohibitive so she has been on ezetimibe alone. Was advised she was approved  to start T[place] on 09/10/22.   Today, she is here for follow-up. When asked how she is doing, she became tearful and explained that her son is at [facility] awaiting a heart transplant. She is obviously very stressed and is traveling back and forth to [facility] frquently. Occasionally feels a fullness in her chest on occasion. Feels well since changing metoprolol to diltiazem. No dyspnea, edema, orthopnea, PND.  BP has been up on occasion, feels that it is white coat hypertension and stress. Asks about a medicine her neurologist prescribed, Clobazam. No interactions identified with her cardiac medications. Started Repatha on 09/15/22, no concerns. She denies palpitations, melena, hematuria, hemoptysis, diaphoresis, weakness, presyncope, syncope.  Past Medical History:  Diagnosis Date   AICD (automatic cardioverter/defibrillator) present    Anxiety    Arthritis    Back pain    Breast cancer ([place]) 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   Bronchiectasis ([place])    Cerebral aneurysm, nonruptured    had a clip put in   CHF (congestive heart failure) (HCC)    Clostridium difficile infection    Depressive disorder, not elsewhere classified    Diverticulosis of colon (without mention of hemorrhage)    Esophageal reflux    Fatty liver    Fibromyalgia    Gastritis    GERD (gastroesophageal reflux disease)    GI bleed 2004   Glaucoma    Hiatal hernia    History of COVID-19 05/04/2021   Hyperlipidemia    Hypertension    Hypothyroidism  Internal hemorrhoids    Joint pain    Multifocal pneumonia 06/03/2021   Obstructive sleep apnea (adult) (pediatric)    Osteoarthritis    Ostium secundum type atrial septal defect    Other chronic nonalcoholic liver disease    Other pulmonary embolism and infarction    Palpitations    Paroxysmal ventricular tachycardia (HCC)    Personal history of radiation therapy     PONV (postoperative nausea and vomiting)    Presence of permanent cardiac pacemaker    PUD (peptic ulcer disease)    Radiation 02/03/15-03/10/15   Right Breast   Sarcoid    per pt , not sure   Schatzki's ring    Shortness of breath    Sleep apnea    Stroke [facility]) 2013   tia/ pt feels it was around 2008 0r 2009   Takotsubo syndrome    Tubular adenoma of colon    Unspecified transient cerebral ischemia    Unspecified vitamin D deficiency     Past Surgical History:  Procedure Laterality Date   ABI  2006   normal   BREAST EXCISIONAL BIOPSY     BREAST LUMPECTOMY Right 2016   BREAST LUMPECTOMY WITH RADIOACTIVE SEED LOCALIZATION Right 01/03/2015   Procedure: BREAST LUMPECTOMY WITH RADIOACTIVE SEED LOCALIZATION;  Surgeon: Autumn Messing III, MD;  Location: [facility];  Service: General;  Laterality: Right;   CARDIAC DEFIBRILLATOR PLACEMENT  2006; 2012   BSX single chamber ICD   carotid dopplers  2006   neg   CEREBRAL ANEURYSM REPAIR  02/1999   COLONOSCOPY     HAMMER TOE SURGERY  11/15/2020   3rd digit bilateral feet    hospitalization  2004   GI bleed, PUD, diverticulosis (EGD,colonscopy)   hospitalization     PE, NSVT, s/p defib   KNEE ARTHROSCOPY     bilateral   LEFT HEART CATHETERIZATION WITH CORONARY ANGIOGRAM N/A 02/22/2012   Procedure: LEFT HEART CATHETERIZATION WITH CORONARY ANGIOGRAM;  Surgeon: Burnell Blanks, MD;  Location: [facility] CATH LAB;  Service: Cardiovascular;  Laterality: N/A;   PARTIAL HYSTERECTOMY     Fibroids   TONGUE BIOPSY  12/12/2017   due to sore tongue and white patches/abnormal cells   TOTAL KNEE ARTHROPLASTY Left 02/13/2016   Procedure: TOTAL KNEE ARTHROPLASTY;  Surgeon: Vickey Huger, MD;  Location: [place];  Service: Orthopedics;  Laterality: Left;   TOTAL KNEE ARTHROPLASTY Right 05/22/2021   Procedure: TOTAL KNEE ARTHROPLASTY;  Surgeon: Vickey Huger, MD;  Location: [facility];  Service: Orthopedics;  Laterality: Right;  with block   UPPER GASTROINTESTINAL  ENDOSCOPY      Current Medications: Current Meds  Medication Sig   acetaminophen (TYLENOL) 500 MG tablet Take 500 mg by mouth every 6 (six) hours as needed for moderate pain.   acetaminophen-codeine (TYLENOL/CODEINE #3) 300-30 MG tablet Take 1 tablet by mouth every 8 (eight) hours as needed for moderate pain.   albuterol (PROVENTIL) (2.5 MG/3ML) 0.083% nebulizer solution USE 1 VIAL IN NEBULIZER EVERY 6 HOURS (Patient taking differently: Take 2.5 mg by nebulization every 6 (six) hours as needed for shortness of breath.)   albuterol (VENTOLIN HFA) 108 (90 Base) MCG/ACT inhaler Inhale 2 puffs into the lungs every 6 (six) hours as needed for wheezing or shortness of breath.   Ascorbic Acid (VITAMIN C) 1000 MG tablet Take 1,000 mg by mouth daily.   aspirin EC 81 MG tablet Take 81 mg by mouth daily. Swallow whole.   azelastine (ASTELIN) 0.1 % nasal spray Place  1 spray into both nostrils 2 (two) times daily. Use in each nostril as directed   budesonide-formoterol (SYMBICORT) 80-4.5 MCG/ACT inhaler Inhale 1 puff into the lungs 2 (two) times daily.   cefUROXime (CEFTIN) 500 MG tablet Take 1 tablet (500 mg total) by mouth 2 (two) times daily with a meal.   cetirizine (ZYRTEC) 10 MG tablet Take 10 mg by mouth daily as needed for allergies.   Cholecalciferol (DIALYVITE VITAMIN D 5000) 125 MCG (5000 UT) capsule Take 5,000 Units by mouth daily.   cloBAZam (ONFI) 10 MG tablet Take 1 tablet (10 mg total) by mouth 2 (two) times daily.   diltiazem (CARDIZEM CD) 120 MG 24 hr capsule Take 1 capsule (120 mg total) by mouth daily. Do Not start this medication until after you wean off Carvedilol.   Empagliflozin (JARDIANCE PO) Take by mouth.   Evolocumab (REPATHA SURECLICK) [id number] MG/ML SOAJ Inject 140 mg into the skin every 14 (fourteen) days.   famotidine (PEPCID) 40 MG tablet Take 1 tablet (40 mg total) by mouth at bedtime.   fexofenadine (ALLEGRA) 180 MG tablet Take 180 mg by mouth daily as needed for allergies  (alternates with zyrtec sometimes).   fluticasone (CUTIVATE) 0.05 % cream Apply topically 2 (two) times daily.   fluticasone (FLONASE) 50 MCG/ACT nasal spray Place 2 sprays into both nostrils daily as needed for allergies or rhinitis.   FOLIC ACID PO Take 1 tablet by mouth daily.   furosemide (LASIX) 20 MG tablet TAKE 2 TABLETS (40 MG) 3 DAYS PER WEEK AND TAKE 1 TABLET (20 MG) THE OTHER DAYS OF THE WEEK   gabapentin (NEURONTIN) 100 MG capsule Take 1 capsule (100 mg total) by mouth 3 (three) times daily.   gabapentin (NEURONTIN) 300 MG capsule Take 1 capsule (300 mg total) by mouth at bedtime.   hydroxypropyl methylcellulose / hypromellose (ISOPTO TEARS / GONIOVISC) 2.5 % ophthalmic solution Place 1 drop into both eyes at bedtime.   JARDIANCE 10 MG TABS tablet Take 10 mg by mouth daily.   latanoprost (XALATAN) 0.005 % ophthalmic solution Place 1 drop into both eyes at bedtime.   levETIRAcetam (KEPPRA) 500 MG tablet Take 1 tablet (500 mg total) by mouth 2 (two) times daily.   levothyroxine (SYNTHROID) 50 MCG tablet TAKE 1 TABLET EVERY DAY   magnesium oxide (MAG-OX) 400 MG tablet Take 1 tablet (400 mg total) by mouth 2 (two) times daily.   mometasone (ELOCON) 0.1 % lotion Apply 1 application topically daily as needed (rash).   montelukast (SINGULAIR) 10 MG tablet TAKE 1 TABLET AT BEDTIME   Multiple Vitamins-Minerals (MULTIVITAMIN PO) Take 1 tablet by mouth daily.   nitroGLYCERIN (NITROSTAT) 0.4 MG SL tablet DISSOLVE ONE TABLET UNDER THE TONGUE EVERY 5 MINUTES AS NEEDED FOR CHEST PAIN.  DO NOT EXCEED A TOTAL OF 3 DOSES IN 15 MINUTES (Patient taking differently: Place 0.4 mg under the tongue every 5 (five) minutes as needed for chest pain.)   nystatin (MYCOSTATIN) 100000 UNIT/ML suspension Take 5 mLs (500,000 Units total) by mouth 4 (four) times daily.   pantoprazole (PROTONIX) 40 MG tablet Take 1 tablet (40 mg total) by mouth daily.   polyethylene glycol powder (GLYCOLAX/MIRALAX) powder Take 17 grams  by mouth daily   potassium chloride (KLOR-CON M) 10 MEQ tablet TAKE 1/2 TABLET EVERY DAY (CALL OFFICE TO SCHEDULE YEARLY APPT)   Respiratory Therapy Supplies (FLUTTER) DEVI 1 Device by Does not apply route as needed.   sodium chloride HYPERTONIC 3 % nebulizer solution Take  by nebulization 2 (two) times daily as needed for cough. 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   tiZANidine (ZANAFLEX) 4 MG tablet Take 0.5-1 tablets (2-4 mg total) by mouth every 8 (eight) hours as needed for muscle spasms.   traZODone (DESYREL) 100 MG tablet TAKE 1 TO 1 AND 1/2 TABLETS AT BEDTIME   triamcinolone cream (KENALOG) 0.1 % Apply 1 Application topically 2 (two) times daily.   [DISCONTINUED] RSV vaccine recomb adjuvanted (AREXVY) 120 MCG/0.5ML injection Inject into the muscle.   Current Facility-Administered Medications for the 10/12/22 encounter (Office Visit) with Ann Maki, Lanice Schwab, NP  Medication   lidocaine (XYLOCAINE) 1 % (with pres) injection 1 mL   lidocaine (XYLOCAINE) 1 % (with pres) injection 3 mL   triamcinolone acetonide (KENALOG-40) injection 40 mg     Allergies:   Ace inhibitors, Amitriptyline hcl, Atorvastatin, Dilantin [phenytoin sodium extended], Duloxetine hcl, Paroxetine, Ramipril, Rosuvastatin, Carbamazepine, Codeine, Paxlovid [nirmatrelvir-ritonavir], Phenytoin sodium extended, Simvastatin, and Wellbutrin [bupropion]   Social History   Socioeconomic History   Marital status: Divorced    Spouse name: Not on file   Number of children: 2   Years of education: 14   Highest education level: Not on file  Occupational History   Occupation: DISABLED  Tobacco Use   Smoking status: Former    Packs/day: 1.00    Years: 15.00    Total pack years: 15.00    Types: Cigarettes    Quit date: 08/20/1989    Years since quitting: 33.1    Passive exposure: Past   Smokeless tobacco: Never  Vaping Use   Vaping Use: Never used  Substance and Sexual Activity   Alcohol use: Yes    Alcohol/week: 3.0 standard drinks of  alcohol    Types: 3 Glasses of wine per week    Comment: occasionally, not on a weekly basis   Drug use: Not Currently    Frequency: 1.0 times per week    Comment: CBD gummy occ; marijuana occasionally   Sexual activity: Not Currently  Other Topics Concern   Not on file  Social History Narrative   Lives alone   Caffeine [id number]   Retired from [employer]   2 children -- 5 grandbabies - 1 great-grandbaby    Social Determinants of Health   Financial Resource Strain: Low Risk  (12/08/2020)   Overall Financial Resource Strain (CARDIA)    Difficulty of Paying Living Expenses: Not hard at all  Food Insecurity: No Food Insecurity (12/08/2020)   Hunger Vital Sign    Worried About Running Out of Food in the Last Year: Never true    Ran Out of Food in the Last Year: Never true  Transportation Needs: No Transportation Needs (12/08/2020)   PRAPARE - Hydrologist (Medical): No    Lack of Transportation (Non-Medical): No  Physical Activity: Inactive (12/08/2020)   Exercise Vital Sign    Days of Exercise per Week: 0 days    Minutes of Exercise per Session: 0 min  Stress: No Stress Concern Present (12/08/2020)   [place]    Feeling of Stress : Not at all  Social Connections: Moderately Isolated (12/08/2020)   Social Connection and Isolation Panel [NHANES]    Frequency of Communication with Friends and Family: More than three times a week    Frequency of Social Gatherings with Friends and Family: More than three times a week    Attends Religious Services: 1 to 4 times per year  Active Member of Clubs or Organizations: No    Attends Archivist Meetings: Never    Marital Status: Divorced     Family History: The patient's family history includes Allergies in her sister; Alzheimer's disease in her mother; Breast cancer in her cousin and maternal aunt; Breast cancer (age of onset: 60) in her  maternal aunt; Cancer (age of onset: 28) in her maternal grandmother; Colon cancer (age of onset: 36) in her maternal aunt; Diabetes in her mother; Heart disease in her brother, father, sister, and son; Hypertension in her mother; Lung cancer in her maternal uncle; Lung cancer (age of onset: 30) in her maternal grandfather; Obesity in her mother; Other in her brother; Pancreatic cancer (age of onset: 43) in her cousin; Prostate cancer (age of onset: 65) in her brother; Seizures in her sister; Throat cancer in her brother. There is no history of Stroke.  ROS:   Please see the history of present illness.   All other systems reviewed and are negative.  Labs/Other Studies Reviewed:    The following studies were reviewed today:  Echo 08/12/21 1. Left ventricular ejection fraction, by estimation, is 50 to 55%. The  left ventricle has low normal function. The left ventricle has no regional  wall motion abnormalities. There is mild concentric left ventricular  hypertrophy. Left ventricular  diastolic parameters are indeterminate.   2. Right ventricular systolic function is normal. The right ventricular  size is normal. There is mildly elevated pulmonary artery systolic  pressure. The estimated right ventricular systolic pressure is [id number] mmHg.   3. Left atrial size was mildly dilated.   4. Right atrial size was mildly dilated.   5. The mitral valve is grossly normal. Trivial mitral valve  regurgitation. No evidence of mitral stenosis.   6. The aortic valve is tricuspid. Aortic valve regurgitation is not  visualized. No aortic stenosis is present.   7. The inferior vena cava is normal in size with greater than 50%  respiratory variability, suggesting right atrial pressure of 3 mmHg.   8. Intra atrial shunt cannot be excluded.   Comparison(s): No significant change from prior study. Prior images  reviewed side by side. Visually EF is similar to prior.   Cardiac Monitor 12/24/19 Findings Symptoms  of flutters assoc mostly with PVCs  Symptoms of flutters also assoc with sinus S VT Nonsustained 6 episodes; fastest 141 bpm for 6 beats; longest for 7 beats at 100 bpm    No serious arrhythmia Symptoms assoc with infrequent PVCs   Lexiscan Myoview 04/13/20 The left ventricular ejection fraction is mildly decreased (45-54%). Nuclear stress EF: 50%. There was no ST segment deviation noted during stress. The perfusion study is normal. This is a low risk study.  Recent Labs: 08/24/2022: ALT 41; BUN 16; Creatinine, Ser 0.80; Hemoglobin 14.2; NT-Pro BNP 76; Platelets 208.0; Potassium 4.4; Sodium 141 09/12/2022: TSH 3.100  Recent Lipid Panel    Component Value Date/Time   CHOL 252 (H) 09/07/2022 1200   TRIG 77 09/07/2022 1200   HDL 84 09/07/2022 1200   CHOLHDL 3.0 09/07/2022 1200   CHOLHDL 3 12/20/2020 1045   VLDL 18.4 12/20/2020 1045   LDLCALC 155 (H) 09/07/2022 1200   LDLDIRECT 141.8 07/04/2007 0936     Risk Assessment/Calculations:      Physical Exam:    VS:  BP 112/68   Pulse 86   Ht 5' 6.5" (1.689 m)   Wt 214 lb 3.2 oz (97.2 kg)   LMP  (LMP  Unknown)   SpO2 97%   BMI 34.05 kg/m     Wt Readings from Last 3 Encounters:  10/12/22 214 lb 3.2 oz (97.2 kg)  09/19/22 215 lb 4 oz (97.6 kg)  09/12/22 216 lb (98 kg)     GEN: Well nourished, well developed in no acute distress HEENT: Normal NECK: No JVD; No carotid bruits CARDIAC: RRR, no murmurs, rubs, gallops RESPIRATORY:  Clear to auscultation without rales, wheezing or rhonchi  ABDOMEN: Soft, non-tender, non-distended MUSCULOSKELETAL:  No edema; No deformity. 2+ pedal pulses, equal bilaterally SKIN: Warm and dry NEUROLOGIC:  Alert and oriented x 3 PSYCHIATRIC:  Normal affect   EKG:  EKG is ordered today.  The ekg ordered today demonstrates normal sinus rhythm at 86 bpm, no ST abnormality   Diagnoses:    1. VT (ventricular tachycardia) ([place])   2. NICM (nonischemic cardiomyopathy) ([place])   3. Chronic combined  systolic and diastolic heart failure ([place])   4. Essential hypertension   5. Hyperlipidemia LDL goal <70   6. Aortic atherosclerosis ([place])   7. S/P ICD (internal cardiac defibrillator) procedure   8. Palpitations    Assessment and Plan:     NICM: S/p ICD implant 2006. LVEF 50-55% on echo 08/12/21. Appears euvolemic on exam today.  Denies dyspnea, orthopnea, PND, edema. BP is well controlled. Continue Jardiance, furosemide.   Palpitations: Quiescent at this time. Continue diltiazem.   Hypertension: BP is well controlled today. Reports occasional occurrences of elevated BP, thinks she has some whitecoat hypertension as well as stress from son's illness.  No medication changes today.  Hyperlipidemia/aortic atherosclerosis: LDL 155 on 09/07/22. Tolerating Repatha without side effects. Will recheck in 2 weeks when she returns for EP follow-up. Continue aspirin, Repatha.   NSVT/ICD implanted: No palpitations, presyncope, syncope. Normal device function on remote check 08/29/2022. Due for follow-up with EP provider in clinic, scheduled today. Feeling better since changing to diltiazem from metoprolol. Management per EP.      Disposition: EP in 1-2 months/6 months with Dr. Irish Lack  Medication Adjustments/Labs and Tests Ordered: Current medicines are reviewed at length with the patient today.  Concerns regarding medicines are outlined above.  Orders Placed This Encounter  Procedures   EKG 12-Lead   No orders of the defined types were placed in this encounter.   Patient Instructions  Medication Instructions:   Your physician recommends that you continue on your current medications as directed. Please refer to the Current Medication list given to you today.   *If you need a refill on your cardiac medications before your next appointment, please call your pharmacy*   Lab Work:  None ordered.  If you have labs (blood work) drawn today and your tests are completely normal, you will  receive your results only by: [place] (if you have MyChart) OR A paper copy in the mail If you have any lab test that is abnormal or we need to change your treatment, we will call you to review the results.   Testing/Procedures:  None ordered.   Follow-Up: At [facility], you and your health needs are our priority.  As part of our continuing mission to provide you with exceptional heart care, we have created designated Provider Care Teams.  These Care Teams include your primary Cardiologist (physician) and Advanced Practice Providers (APPs -  Physician Assistants and Nurse Practitioners) who all work together to provide you with the care you need, when you need it.  We recommend signing up for the  patient portal called "MyChart".  Sign up information is provided on this After Visit Summary.  MyChart is used to connect with patients for Virtual Visits (Telemedicine).  Patients are able to view lab/test results, encounter notes, upcoming appointments, etc.  Non-urgent messages can be sent to your provider as well.   To learn more about what you can do with MyChart, go to [email or web address].    Your next appointment:   3 week(s)  Provider:   You may see Virl Axe, MD or one of the following Advanced Practice Providers on your designated Care Team:   Tommye Standard, [place], Please come fasting to this appointment. Nothing to eat or drink except water after midnight.   You will see Dr. Irish Lack in September.        Signed, Emmaline Life, NP  10/12/2022 10:33 AM    [place]

## 2022-10-09 ENCOUNTER — Inpatient Hospital Stay: Payer: Medicare Other | Attending: Hematology and Oncology | Admitting: Hematology and Oncology

## 2022-10-09 ENCOUNTER — Encounter: Payer: Self-pay | Admitting: Hematology and Oncology

## 2022-10-09 ENCOUNTER — Encounter: Payer: Self-pay | Admitting: Interventional Cardiology

## 2022-10-09 DIAGNOSIS — Z853 Personal history of malignant neoplasm of breast: Secondary | ICD-10-CM

## 2022-10-09 NOTE — Progress Notes (Signed)
[place] NOTE  Patient Care Team: Adriana Mai, MD as PCP - General (Family Medicine) Deboraha Sprang, MD as PCP - Electrophysiology (Cardiology) Jettie Booze, MD as PCP - Cardiology (Cardiology) Jovita Kussmaul, MD as Consulting Physician (General Surgery) Truitt Merle, MD as Consulting Physician (Hematology) Thea Silversmith, MD as Consulting Physician (Radiation Oncology) Rockwell Germany, RN as Registered Nurse Mauro Kaufmann, RN as Registered Nurse Deboraha Sprang, MD as Consulting Physician (Cardiology) Holley Bouche, NP (Inactive) as Nurse Practitioner (Nurse Practitioner) Sylvan Cheese, NP as Nurse Practitioner (Nurse Practitioner) Chesley Mires, MD as Consulting Physician (Pulmonary Disease) Jovita Kussmaul, MD as Consulting Physician (General Surgery) Bo Merino, MD as Consulting Physician (Rheumatology)  CHIEF COMPLAINTS/PURPOSE OF CONSULTATION:  History of breast cancer  ASSESSMENT & PLAN:   This is a very pleasant 72 year old female patient with past medical history significant for 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   She reported some pain in the scar tissue, hence we had imaging, Mammogram and [place] which didn't show any evidence of malignancy. She is still concerned about it, she wondered if there is anything else we need to do. I will message Dr [name] and see if he has any additional recommendations. She was encouraged to call us back if she doesn't hear from Dr Ethlyn Gallery office, she expressed understanding.  Adriana Pike MD   HISTORY OF PRESENTING ILLNESS:  [name] 72 y.o. female is here because of history of breast cancer.  This is a very pleasant 72 year old female patient with  past medical history significant for right breast DCIS status postlumpectomy, weak ER/PR positivity and given severe cardiovascular disease, not prescribed antiestrogen therapy.  She looks like may have been lost to follow-up.  She most recently felt a small lump in the right breast and was worried about recurrence of breast cancer.   She had mammogram and [place] which didn't show any evidence of cancer. She is however still worried about this and wonders if she needs a biopsy. No other concerning ROS.  MEDICAL HISTORY:  Past Medical History:  Diagnosis Date   AICD (automatic cardioverter/defibrillator) present    Anxiety    Arthritis    Back pain    Breast cancer ([place]) 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   Bronchiectasis ([place])    Cerebral aneurysm, nonruptured    had a clip put in   CHF (congestive heart failure) (HCC)    Clostridium difficile infection    Depressive disorder, not elsewhere classified    Diverticulosis of colon (without mention of hemorrhage)    Esophageal reflux    Fatty liver    Fibromyalgia    Gastritis    GERD (gastroesophageal reflux disease)    GI bleed 2004   Glaucoma    Hiatal hernia    History of COVID-19 05/04/2021   Hyperlipidemia    Hypertension    Hypothyroidism    Internal hemorrhoids    Joint pain    Multifocal pneumonia 06/03/2021   Obstructive sleep apnea (adult) (pediatric)    Osteoarthritis    Ostium secundum type atrial septal defect    Other chronic nonalcoholic liver disease    Other pulmonary embolism and infarction    Palpitations    Paroxysmal ventricular tachycardia ([place])  Personal history of radiation therapy    PONV (postoperative nausea and vomiting)    Presence of permanent cardiac pacemaker    PUD (peptic ulcer disease)    Radiation 02/03/15-03/10/15   Right Breast   Sarcoid    per pt , not sure   Schatzki's ring    Shortness of breath    Sleep apnea    Stroke [facility]) 2013   tia/ pt feels it was around 2008 0r  2009   Takotsubo syndrome    Tubular adenoma of colon    Unspecified transient cerebral ischemia    Unspecified vitamin D deficiency     SURGICAL HISTORY: Past Surgical History:  Procedure Laterality Date   ABI  2006   normal   BREAST EXCISIONAL BIOPSY     BREAST LUMPECTOMY Right 2016   BREAST LUMPECTOMY WITH RADIOACTIVE SEED LOCALIZATION Right 01/03/2015   Procedure: BREAST LUMPECTOMY WITH RADIOACTIVE SEED LOCALIZATION;  Surgeon: Autumn Messing III, MD;  Location: [place];  Service: General;  Laterality: Right;   CARDIAC DEFIBRILLATOR PLACEMENT  2006; 2012   BSX single chamber ICD   carotid dopplers  2006   neg   CEREBRAL ANEURYSM REPAIR  02/1999   COLONOSCOPY     HAMMER TOE SURGERY  11/15/2020   3rd digit bilateral feet    hospitalization  2004   GI bleed, PUD, diverticulosis (EGD,colonscopy)   hospitalization     PE, NSVT, s/p defib   KNEE ARTHROSCOPY     bilateral   LEFT HEART CATHETERIZATION WITH CORONARY ANGIOGRAM N/A 02/22/2012   Procedure: LEFT HEART CATHETERIZATION WITH CORONARY ANGIOGRAM;  Surgeon: Burnell Blanks, MD;  Location[facility] C[facility] LAB;  Service: Cardiovascular;  Laterality: N/A;   PARTIAL HYSTERECTOMY     Fibroids   TONGUE BIOPSY  12/12/2017   due to sore tongue and white patches/abnormal cells   TOTAL KNEE ARTHROPLASTY Left 02/13/2016   Procedure: TOTAL KNEE ARTHROPLASTY;  Surgeon: Vickey Huger, MD;  Location: [place];  Service: Orthopedics;  Laterality: Left;   TOTAL KNEE ARTHROPLASTY Right 05/22/2021   Procedure: TOTAL KNEE ARTHROPLASTY;  Surgeon: Vickey Huger, MD;  Location: [facility];  Service: Orthopedics;  Laterality: Right;  with block   UPPER GASTROINTESTINAL ENDOSCOPY      SOCIAL HISTORY: Social History   Socioeconomic History   Marital status: Divorced    Spouse name: Not on file   Number of children: 2   Years of education: 14   Highest education level: Not on file  Occupational History   Occupation: DISABLED  Tobacco Use   Smoking status:  Former    Packs/day: 1.00    Years: 15.00    Total pack years: 15.00    Types: Cigarettes    Quit date: 08/20/1989    Years since quitting: 33.1    Passive exposure: Past   Smokeless tobacco: Never  Vaping Use   Vaping Use: Never used  Substance and Sexual Activity   Alcohol use: Yes    Alcohol/week: 3.0 standard drinks of alcohol    Types: 3 Glasses of wine per week    Comment: occasionally, not on a weekly basis   Drug use: Not Currently    Frequency: 1.0 times per week    Comment: CBD gummy occ; marijuana occasionally   Sexual activity: Not Currently  Other Topics Concern   Not on file  Social History Narrative   Lives alone   Caffeine [id number]   Retired from [employer]   2 children -- 5 grandbabies -  1 great-grandbaby    Social Determinants of Health   Financial Resource Strain: Low Risk  (12/08/2020)   Overall Financial Resource Strain (CARDIA)    Difficulty of Paying Living Expenses: Not hard at all  Food Insecurity: No Food Insecurity (12/08/2020)   Hunger Vital Sign    Worried About Running Out of Food in the Last Year: Never true    Ran Out of Food in the Last Year: Never true  Transportation Needs: No Transportation Needs (12/08/2020)   PRAPARE - Hydrologist (Medical): No    Lack of Transportation (Non-Medical): No  Physical Activity: Inactive (12/08/2020)   Exercise Vital Sign    Days of Exercise per Week: 0 days    Minutes of Exercise per Session: 0 min  Stress: No Stress Concern Present (12/08/2020)   [place]    Feeling of Stress : Not at all  Social Connections: Moderately Isolated (12/08/2020)   Social Connection and Isolation Panel [NHANES]    Frequency of Communication with Friends and Family: More than three times a week    Frequency of Social Gatherings with Friends and Family: More than three times a week    Attends Religious Services: 1 to 4 times per year     Active Member of [employer] or Organizations: No    Attends Archivist Meetings: Never    Marital Status: Divorced  Human resources officer Violence: Not At Risk (12/08/2020)   Humiliation, Afraid, Rape, and Kick questionnaire    Fear of Current or Ex-Partner: No    Emotionally Abused: No    Physically Abused: No    Sexually Abused: No    FAMILY HISTORY: Family History  Problem Relation Age of Onset   Diabetes Mother    Alzheimer's disease Mother    Hypertension Mother    Obesity Mother    Heart disease Father    Seizures Sister    Allergies Sister    Heart disease Sister    Other Brother        Thyroid problem 10/2016   Heart disease Brother    Prostate cancer Brother 48       same brother as throat cancer   Throat cancer Brother        dx in his 27s; also a smoker   Cancer Maternal Grandmother 32       colon cancer or abdominal cancer   Lung cancer Maternal Grandfather 78   Heart disease Son        Cardiac Arrest 07/2016   Breast cancer Maternal Aunt 55   Colon cancer Maternal Aunt 61       same sister as breast at 24   Breast cancer Maternal Aunt        dx in her 52s   Lung cancer Maternal Uncle    Pancreatic cancer Cousin 55       maternal first cousin   Breast cancer Cousin        paternal first cousin twice removed died in her 9s   Stroke Neg Hx     ALLERGIES:  is allergic to ace inhibitors, amitriptyline hcl, atorvastatin, cymbalta [duloxetine hcl], dilantin [phenytoin sodium extended], paroxetine, ramipril, rosuvastatin, carbamazepine, codeine, paxlovid [nirmatrelvir-ritonavir], phenytoin sodium extended, simvastatin, and wellbutrin [bupropion].  MEDICATIONS:  Current Outpatient Medications  Medication Sig Dispense Refill   acetaminophen (TYLENOL) 500 MG tablet Take 500 mg by mouth every 6 (six) hours as needed for moderate pain.  acetaminophen-codeine (TYLENOL/CODEINE #3) 300-30 MG tablet Take 1 tablet by mouth every 8 (eight) hours as needed for  moderate pain. 90 tablet 3   albuterol (PROVENTIL) (2.5 MG/3ML) 0.083% nebulizer solution USE 1 VIAL IN NEBULIZER EVERY 6 HOURS (Patient taking differently: Take 2.5 mg by nebulization every 6 (six) hours as needed for shortness of breath.) 120 mL 11   albuterol (VENTOLIN HFA) 108 (90 Base) MCG/ACT inhaler Inhale 2 puffs into the lungs every 6 (six) hours as needed for wheezing or shortness of breath. 18 g 3   Ascorbic Acid (VITAMIN C) 1000 MG tablet Take 1,000 mg by mouth daily.     aspirin EC 81 MG tablet Take 81 mg by mouth daily. Swallow whole.     azelastine (ASTELIN) 0.1 % nasal spray Place 1 spray into both nostrils 2 (two) times daily. Use in each nostril as directed 90 mL 3   budesonide-formoterol (SYMBICORT) 80-4.5 MCG/ACT inhaler Inhale 1 puff into the lungs 2 (two) times daily. 1 each 12   cefUROXime (CEFTIN) 500 MG tablet Take 1 tablet (500 mg total) by mouth 2 (two) times daily with a meal. 14 tablet 0   cetirizine (ZYRTEC) 10 MG tablet Take 10 mg by mouth daily as needed for allergies.     Cholecalciferol (DIALYVITE VITAMIN D 5000) 125 MCG (5000 UT) capsule Take 5,000 Units by mouth daily.     cloBAZam (ONFI) 10 MG tablet Take 1 tablet (10 mg total) by mouth 2 (two) times daily. 60 tablet 5   diltiazem (CARDIZEM CD) 120 MG 24 hr capsule Take 1 capsule (120 mg total) by mouth daily. Do Not start this medication until after you wean off Carvedilol. 90 capsule 2   Empagliflozin (JARDIANCE PO) Take by mouth.     Evolocumab (REPATHA SURECLICK) [id number] MG/ML SOAJ Inject 140 mg into the skin every 14 (fourteen) days. 6 mL 3   famotidine (PEPCID) 40 MG tablet Take 1 tablet (40 mg total) by mouth at bedtime. 360 tablet 6   fexofenadine (ALLEGRA) 180 MG tablet Take 180 mg by mouth daily as needed for allergies (alternates with zyrtec sometimes).     fluticasone (CUTIVATE) 0.05 % cream Apply topically 2 (two) times daily. 60 g 2   fluticasone (FLONASE) 50 MCG/ACT nasal spray Place 2 sprays into both  nostrils daily as needed for allergies or rhinitis. 48 g 3   FOLIC ACID PO Take 1 tablet by mouth daily.     furosemide (LASIX) 20 MG tablet TAKE 2 TABLETS (40 MG) 3 DAYS PER WEEK AND TAKE 1 TABLET (20 MG) THE OTHER DAYS OF THE WEEK 130 tablet 1   gabapentin (NEURONTIN) 100 MG capsule Take 1 capsule (100 mg total) by mouth 3 (three) times daily. (Patient not taking: Reported on 09/12/2022) 90 capsule 6   gabapentin (NEURONTIN) 300 MG capsule Take 1 capsule (300 mg total) by mouth at bedtime. 30 capsule 2   hydroxypropyl methylcellulose / hypromellose (ISOPTO TEARS / GONIOVISC) 2.5 % ophthalmic solution Place 1 drop into both eyes at bedtime.     JARDIANCE 10 MG TABS tablet Take 10 mg by mouth daily.     latanoprost (XALATAN) 0.005 % ophthalmic solution Place 1 drop into both eyes at bedtime.     levETIRAcetam (KEPPRA) 500 MG tablet Take 1 tablet (500 mg total) by mouth 2 (two) times daily. 180 tablet 1   levothyroxine (SYNTHROID) 50 MCG tablet TAKE 1 TABLET EVERY DAY 90 tablet 1   magnesium oxide (MAG-OX) 400  MG tablet Take 1 tablet (400 mg total) by mouth 2 (two) times daily. 180 tablet 3   mometasone (ELOCON) 0.1 % lotion Apply 1 application topically daily as needed (rash). 60 mL 1   montelukast (SINGULAIR) 10 MG tablet TAKE 1 TABLET AT BEDTIME 90 tablet 3   Multiple Vitamins-Minerals (MULTIVITAMIN PO) Take 1 tablet by mouth daily.     nitroGLYCERIN (NITROSTAT) 0.4 MG SL tablet DISSOLVE ONE TABLET UNDER THE TONGUE EVERY 5 MINUTES AS NEEDED FOR CHEST PAIN.  DO NOT EXCEED A TOTAL OF 3 DOSES IN 15 MINUTES (Patient taking differently: Place 0.4 mg under the tongue every 5 (five) minutes as needed for chest pain.) 25 tablet 0   nystatin (MYCOSTATIN) 100000 UNIT/ML suspension Take 5 mLs (500,000 Units total) by mouth 4 (four) times daily. 60 mL 0   pantoprazole (PROTONIX) 40 MG tablet Take 1 tablet (40 mg total) by mouth daily. 90 tablet 3   polyethylene glycol powder (GLYCOLAX/MIRALAX) powder Take 17  grams by mouth daily 1080 g 0   potassium chloride (KLOR-CON M) 10 MEQ tablet TAKE 1/2 TABLET EVERY DAY (CALL OFFICE TO SCHEDULE YEARLY APPT) 45 tablet 3   Respiratory Therapy Supplies (FLUTTER) DEVI 1 Device by Does not apply route as needed. 1 each 0   RSV vaccine recomb adjuvanted (AREXVY) 120 MCG/0.5ML injection Inject into the muscle. 0.5 mL 0   sodium chloride HYPERTONIC 3 % nebulizer solution Take by nebulization 2 (two) times daily as needed for cough. 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 750 mL 12   tiZANidine (ZANAFLEX) 4 MG tablet Take 0.5-1 tablets (2-4 mg total) by mouth every 8 (eight) hours as needed for muscle spasms. 90 tablet 5   traMADol (ULTRAM) 50 MG tablet Take 1 tablet (50 mg total) by mouth every 12 (twelve) hours as needed. 180 tablet 1   traZODone (DESYREL) 100 MG tablet TAKE 1 TO 1 AND 1/2 TABLETS AT BEDTIME 135 tablet 1   triamcinolone cream (KENALOG) 0.1 % Apply 1 Application topically 2 (two) times daily. 453.6 g 0   Current Facility-Administered Medications  Medication Dose Route Frequency Provider Last Rate Last Admin   lidocaine (XYLOCAINE) 1 % (with pres) injection 1 mL  1 mL Other Once Lovorn, Megan, MD       lidocaine (XYLOCAINE) 1 % (with pres) injection 3 mL  3 mL Other Once Lovorn, Megan, MD       triamcinolone acetonide (KENALOG-40) injection 40 mg  40 mg Intramuscular Once Lovorn, Megan, MD         PHYSICAL EXAMINATION: ECOG PERFORMANCE STATUS: 0 - Asymptomatic  There were no vitals filed for this visit.  There were no vitals filed for this visit.   PE not done, telephone visit  LABORATORY DATA:  I have reviewed the data as listed Lab Results  Component Value Date   WBC 4.7 08/24/2022   HGB 14.2 08/24/2022   HCT 44.0 08/24/2022   MCV 94.2 08/24/2022   PLT 208.0 08/24/2022     Chemistry      Component Value Date/Time   NA 141 08/24/2022 1356   NA 138 03/23/2022 1553   NA 141 12/01/2014 0847   K 4.4 08/24/2022 1356   K 3.8 12/01/2014 0847   CL 104  08/24/2022 1356   CO2 31 08/24/2022 1356   CO2 26 12/01/2014 0847   BUN 16 08/24/2022 1356   BUN 10 03/23/2022 1553   BUN 10.6 12/01/2014 0847   CREATININE 0.80 08/24/2022 1356   CREATININE 0.82  02/27/2017 1518   CREATININE 0.8 12/01/2014 0847      Component Value Date/Time   CALCIUM 9.9 08/24/2022 1356   CALCIUM 9.8 12/01/2014 0847   ALKPHOS 109 08/24/2022 1356   ALKPHOS 127 12/01/2014 0847   AST 39 (H) 08/24/2022 1356   AST 29 12/01/2014 0847   ALT 41 (H) 08/24/2022 1356   ALT 33 12/01/2014 0847   BILITOT 0.4 08/24/2022 1356   BILITOT 0.3 10/19/2021 0911   BILITOT 0.42 12/01/2014 0847       RADIOGRAPHIC STUDIES: I have personally reviewed the radiological images as listed and agreed with the findings in the report. MM DIAG BREAST TOMO UNI RIGHT  Result Date: 09/27/2022 CLINICAL DATA:  72 year old female presenting for evaluation of a small lump with tenderness in the medial aspect of the right breast. Patient has a history of right breast cancer status post lumpectomy in 2016. EXAM: DIGITAL DIAGNOSTIC UNILATERAL RIGHT MAMMOGRAM WITH TOMOSYNTHESIS; ULTRASOUND RIGHT BREAST LIMITED TECHNIQUE: Right digital diagnostic mammography and breast tomosynthesis was performed.; Targeted ultrasound examination of the right breast was performed COMPARISON:  Previous exam(s). ACR Breast Density Category a: The breast tissue is almost entirely fatty. FINDINGS: Mammogram: Right breast: A skin BB [place] the palpable site of concern reported by the patient in the medial right breast. A spot tangential view of this area was performed in addition to standard views. At the palpable site there are grouped round calcifications within the skin that has been present dating back to at least June 2020 consistent with a benign process. There is no new associated lump or distortion. There are no new findings elsewhere in the right breast. On physical exam at the site of concern reported by the patient in the medial  right breast I do not feel a discrete mass or focal area of thickening. Ultrasound: Targeted ultrasound is performed at the palpable site of concern in the medial right breast at 2 o'clock 13 cm from the nipple demonstrating no cystic or solid mass. IMPRESSION: No mammographic or sonographic imaging abnormality at the palpable site of concern in the medial right breast. RECOMMENDATION: 1. Recommend any further workup of the palpable site in the right breast be on a clinical basis given negative imaging findings. 2. Return for routine annual screening mammography which will be due in August 2024. I have discussed the findings and recommendations with the patient. If applicable, a reminder letter will be sent to the patient regarding the next appointment. BI-RADS CATEGORY  1: Negative. Electronically Signed   By: Audie Pinto M.D.   On: 09/27/2022 10:40  US BREAST LTD UNI RIGHT INC AXILLA  Result Date: 09/27/2022 CLINICAL DATA:  71 year old female presenting for evaluation of a small lump with tenderness in the medial aspect of the right breast. Patient has a history of right breast cancer status post lumpectomy in 2016. EXAM: DIGITAL DIAGNOSTIC UNILATERAL RIGHT MAMMOGRAM WITH TOMOSYNTHESIS; ULTRASOUND RIGHT BREAST LIMITED TECHNIQUE: Right digital diagnostic mammography and breast tomosynthesis was performed.; Targeted ultrasound examination of the right breast was performed COMPARISON:  Previous exam(s). ACR Breast Density Category a: The breast tissue is almost entirely fatty. FINDINGS: Mammogram: Right breast: A skin BB [place] the palpable site of concern reported by the patient in the medial right breast. A spot tangential view of this area was performed in addition to standard views. At the palpable site there are grouped round calcifications within the skin that has been present dating back to at least June 2020 consistent with a benign process.  There is no new associated lump or distortion. There are no  new findings elsewhere in the right breast. On physical exam at the site of concern reported by the patient in the medial right breast I do not feel a discrete mass or focal area of thickening. Ultrasound: Targeted ultrasound is performed at the palpable site of concern in the medial right breast at 2 o'clock 13 cm from the nipple demonstrating no cystic or solid mass. IMPRESSION: No mammographic or sonographic imaging abnormality at the palpable site of concern in the medial right breast. RECOMMENDATION: 1. Recommend any further workup of the palpable site in the right breast be on a clinical basis given negative imaging findings. 2. Return for routine annual screening mammography which will be due in August 2024. I have discussed the findings and recommendations with the patient. If applicable, a reminder letter will be sent to the patient regarding the next appointment. BI-RADS CATEGORY  1: Negative. Electronically Signed   By: Audie Pinto M.D.   On: 09/27/2022 10:40   I connected with  Jeral Fruit on 10/09/22 by a telephone application and verified that I am speaking with the correct person using two identifiers.   I discussed the limitations of evaluation and management by telemedicine. The patient expressed understanding and agreed to proceed.   All questions were answered. The patient knows to call the clinic with any problems, questions or concerns. I spent 10 minutes in the care of this patient including H and P, review of records, counseling and coordination of care.     Adriana Pike, MD 10/09/2022 9:06 AM

## 2022-10-10 ENCOUNTER — Ambulatory Visit: Payer: Medicare Other | Admitting: Physical Therapy

## 2022-10-10 DIAGNOSIS — M25552 Pain in left hip: Secondary | ICD-10-CM

## 2022-10-10 DIAGNOSIS — G8929 Other chronic pain: Secondary | ICD-10-CM

## 2022-10-10 DIAGNOSIS — M6281 Muscle weakness (generalized): Secondary | ICD-10-CM

## 2022-10-10 NOTE — Therapy (Signed)
OUTPATIENT PHYSICAL THERAPY TREATMENT NOTE   Patient Name: [name] MRN: [id number] DOB:10-Mar-1951, 73 y.o., female Today's Date: 10/10/2022  PCP: Camillia Herter, NP   REFERRING PROVIDER: Dorna Mai, MD  See note below for Objective Data and Assessment of Progress/Goals.      END OF SESSION:   PT End of Session - 10/10/22 0938     Visit Number 13    Number of Visits 22    Date for PT Re-Evaluation 11/06/22    Authorization Type MEDICARE PART A AND B    PT Start Time 0935    PT Stop Time 1015    PT Time Calculation (min) 40 min                Past Medical History:  Diagnosis Date   AICD (automatic cardioverter/defibrillator) present    Anxiety    Arthritis    Back pain    Breast cancer ([place]) 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   Bronchiectasis ([place])    Cerebral aneurysm, nonruptured    had a clip put in   CHF (congestive heart failure) (HCC)    Clostridium difficile infection    Depressive disorder, not elsewhere classified    Diverticulosis of colon (without mention of hemorrhage)    Esophageal reflux    Fatty liver    Fibromyalgia    Gastritis    GERD (gastroesophageal reflux disease)    GI bleed 2004   Glaucoma    Hiatal hernia    History of COVID-19 05/04/2021   Hyperlipidemia    Hypertension    Hypothyroidism    Internal hemorrhoids    Joint pain    Multifocal pneumonia 06/03/2021   Obstructive sleep apnea (adult) (pediatric)    Osteoarthritis    Ostium secundum type atrial septal defect    Other chronic nonalcoholic liver disease    Other pulmonary embolism and infarction    Palpitations    Paroxysmal ventricular tachycardia (HCC)    Personal history of radiation therapy    PONV (postoperative nausea and vomiting)    Presence of permanent cardiac pacemaker    PUD (peptic ulcer disease)    Radiation 02/03/15-03/10/15   Right Breast   Sarcoid    per pt , not sure   Schatzki's ring    Shortness of breath    Sleep  apnea    Stroke [facility]) 2013   tia/ pt feels it was around 2008 0r 2009   Takotsubo syndrome    Tubular adenoma of colon    Unspecified transient cerebral ischemia    Unspecified vitamin D deficiency    Past Surgical History:  Procedure Laterality Date   ABI  2006   normal   BREAST EXCISIONAL BIOPSY     BREAST LUMPECTOMY Right 2016   BREAST LUMPECTOMY WITH RADIOACTIVE SEED LOCALIZATION Right 01/03/2015   Procedure: BREAST LUMPECTOMY WITH RADIOACTIVE SEED LOCALIZATION;  Surgeon: Autumn Messing III, MD;  Location: [place];  Service: General;  Laterality: Right;   CARDIAC DEFIBRILLATOR PLACEMENT  2006; 2012   BSX single chamber ICD   carotid dopplers  2006   neg   CEREBRAL ANEURYSM REPAIR  02/1999   COLONOSCOPY     HAMMER TOE SURGERY  11/15/2020   3rd digit bilateral feet    hospitalization  2004   GI bleed, PUD, diverticulosis (EGD,colonscopy)   hospitalization     PE, NSVT, s/p defib   KNEE ARTHROSCOPY     bilateral   LEFT  HEART CATHETERIZATION WITH CORONARY ANGIOGRAM N/A 02/22/2012   Procedure: LEFT HEART CATHETERIZATION WITH CORONARY ANGIOGRAM;  Surgeon: Burnell Blanks, MD;  Location: [facility] CATH LAB;  Service: Cardiovascular;  Laterality: N/A;   PARTIAL HYSTERECTOMY     Fibroids   TONGUE BIOPSY  12/12/2017   due to sore tongue and white patches/abnormal cells   TOTAL KNEE ARTHROPLASTY Left 02/13/2016   Procedure: TOTAL KNEE ARTHROPLASTY;  Surgeon: Vickey Huger, MD;  Location: [place];  Service: Orthopedics;  Laterality: Left;   TOTAL KNEE ARTHROPLASTY Right 05/22/2021   Procedure: TOTAL KNEE ARTHROPLASTY;  Surgeon: Vickey Huger, MD;  Location: [facility];  Service: Orthopedics;  Laterality: Right;  with block   UPPER GASTROINTESTINAL ENDOSCOPY     Patient Active Problem List   Diagnosis Date Noted   Partial symptomatic epilepsy with complex partial seizures, not intractable, without status epilepticus ([place]) 08/10/2022   Low back pain 08/10/2022   Gait abnormality 05/04/2022    Muscle spasm 05/04/2022   Upper airway cough syndrome 01/19/2022   Chronic left shoulder pain 11/13/2021   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   Localized swelling of right lower leg 08/29/2021   Tonsillar exudate 08/29/2021   Candida vaginitis 08/28/2021   Chronic pain syndrome 08/25/2021   Multifocal pneumonia 06/03/2021   Pre-operative respiratory examination 05/18/2021   Statin myopathy 04/19/2021   Pain in joint of right shoulder 02/10/2021   Arthritis of both acromioclavicular joints 01/02/2021   Trochanteric bursitis of right hip 10/03/2020   Tailbone injury 05/05/2020   UTI (urinary tract infection) 05/05/2020   Insomnia 05/05/2020   Leg cramps 02/16/2020   Dysuria 02/16/2020   Right hip impingement syndrome 02/01/2020   Right bicipital tenosynovitis 02/01/2020   Bronchiectasis without complication ([place]) 0000000   Myofascial pain 12/21/2019   Estrogen deficiency 12/10/2019   Obesity (BMI 30-39.9) 12/10/2019   Bronchiectasis with (acute) exacerbation (HCC) 10/23/2019   Elevated LFTs 10/15/2019   Lower abdominal pain 09/01/2019   Neck pain 10/25/2017   Paresthesias in left hand 10/25/2017   Paresthesia of foot, bilateral 04/12/2017   Primary osteoarthritis of both hands 09/27/2016   Primary osteoarthritis of both knees 09/27/2016   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   Nasopharyngitis, 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/2016   S/P total knee replacement 02/13/2016   Chronic pain of both knees 12/06/2015   Globus pharyngeus 05/17/2015   Genetic testing 12/23/2014   Family history of breast cancer    Family history of colon cancer    Family history of pancreatic cancer    History of breast cancer 11/23/2014   Allergy to ACE inhibitors 09/27/2014   Prediabetes 06/23/2013   Muscle spasms of both lower extremities 01/15/2013   Hx of Clostridium difficile infection 10/10/2012   Dyspnea on exertion 04/02/2012   NICM (nonischemic cardiomyopathy) ([place]) 03/07/2012   Post-menopausal  01/11/2012   Atypical chest pain 11/07/2011   Obstructive sleep apnea 04/04/2010   V-tach ([place]) 01/03/2009   ICD  [employer]  Single chamber 01/03/2009   PFO (patent foramen ovale) 09/29/2008   Vitamin D deficiency 08/17/2008   Hypothyroidism 11/18/2006   Hyperlipidemia 11/18/2006   Depression with anxiety 11/18/2006   Essential hypertension 11/18/2006   Mitral valve prolapse 11/18/2006   Cerebral aneurysm 11/18/2006   Allergic rhinitis 11/18/2006   GERD 11/18/2006   Diverticulosis of colon 11/18/2006   Fatty liver 11/18/2006   Fibromyalgia 11/18/2006    REFERRING DIAG: M54.50 (ICD-10-CM) - Left-sided low back pain without sciatica, unspecified chronicity; M25.552 (ICD-10-CM) - Left hip pain  THERAPY  DIAG:  Pain in left hip  Muscle weakness (generalized)  Chronic bilateral low back pain without sciatica  Rationale for Evaluation and Treatment Rehabilitation  PERTINENT HISTORY:  Automatic cardioverter/defibrillator; Bilat knee replacements; arthritis; fibromyalgia; depression, High BMI  PRECAUTIONS: Pacemaker  SUBJECTIVE:                                                                                                                                                                                      SUBJECTIVE STATEMENT:  I think I am better. My son is still in the hospital waiting on a heart. I am so tired. I have been visiting almost every day.   PAIN:  Are you having pain? Yes: NPRS scale: 1/10 for L hip; 2/10 for low back Pain location: L lowback, gluteal, lat hip, groin, and ant thigh Pain description: ache, sharp occasionally, constant Aggravating factors: Walking, sleeping, in/out of car, bending over Relieving factors: Muscle relaxors, heat pad, tylenol On Eval pain range=6-8/10   OBJECTIVE: (objective measures completed at initial evaluation unless otherwise dated)   Pt reports xrays were taken with [facility] yesterday. He was able to recall there was  no fracture, proper spacing for the joint   PATIENT SURVEYS:  FOTO: Perceived function   38%, predicted   55%  09/04/22: 54%  10/02/22: 54%   SCREENING FOR RED FLAGS: Bowel or bladder incontinence: No Spinal tumors: No Compression fracture: No   COGNITION: Overall cognitive status: Within functional limits for tasks assessed                          SENSATION: WFL   MUSCLE LENGTH: Hamstrings: Right NT deg; Left NT deg Marcello Moores test: Right NT deg; Left NT deg   POSTURE: increased lumbar lordosis   PALPATION: TTP with increased muscle tightness to the L gluteal region   LUMBAR ROM:    AROM eval 10/10/22  Flexion Mod limited, low back pulling pain Reaches min shins, low back pain  Extension Mod limited, pain low back and L hip Min limited, pain on low back  Right lateral flexion Mod limited, pulling pain R hip Min limited , pain in left hip  Left lateral flexion Mod limited, pain across low back Min limited, min low back pain   Right rotation Mod limited, pulling pain R hip and across low back WFL min pain  Left rotation Mod limited, pain across low back WFL , min low back    (Blank rows = not tested)   LOWER EXTREMITY ROM:     L decreased in comparison to R  due to pain Passive  Right eval Left eval  Left 10/02/22  Hip flexion   P WFL  Hip extension   P   Hip abduction   P WFL  Hip adduction       Hip internal rotation   P WFL  Hip external rotation   P WFL  Knee flexion       Knee extension       Ankle dorsiflexion       Ankle plantarflexion       Ankle inversion       Ankle eversion       P=pain  (Blank rows = not tested)   LOWER EXTREMITY MMT:   L LE strength is deceased in comparison to R due to Pain MMT Right eval Left eval Right 10/02/22 Left 10/02/22  Hip flexion   P 4+ 4-  Hip extension   P    Hip abduction   P 4- 4  Hip adduction   P    Hip internal rotation   P    Hip external rotation   P    Knee flexion        Knee extension        Ankle  dorsiflexion        Ankle plantarflexion        Ankle inversion        Ankle eversion        P=pain  (Blank rows = not tested)   LUMBAR SPECIAL TESTS:  Straight leg raise test: Negative, Slump test: Negative, and SI Compression/distraction test: Negative          FADER L hip positive   FUNCTIONAL TESTS:  5 times sit to stand: 08/17/22: 40.8 sec 09/18/22= 29.2" (low mat, with UE): 10/02/22: 23.3 sec without UE from standard mat 2 minute walk test: 08/17/22: 185 Feet : 10/02/22: 308 feet    GAIT: Distance walked: 138f Assistive device utilized: Single point cane Level of assistance: Complete Independence Comments: decreased pace   TODAY'S TREATMENT: [facility] Adult PT Treatment:                                                DATE: 10/10/22 Therapeutic Exercise: Nustep L5 UE/Le x 5 minutes  Lumbar AROM Seated Lumbar stretches: side bend , rotation, lumbar flexion, hamstring stretches: options reviewed to perform while visiting son in hospital.  [place]  L[place] -min rom   [facility]:                                                DATE: 10/02/22 Therapeutic Exercise: Seated blue band clam Seated march blue band  Hip abduction x 10 each  Therapeutic Activity: 5 x STS 2 MWT MMT FOTO   [facility]                                                [facility]: 09[facility]25/22 Therapeutic Exercise: Nustep 5 mins L6 LE Seated forward roll outs c swiss ball forward and laterally  Seated PPTs c swiis ball press x15 3" PPT x15 3" Abdominal bracing x10 5" LTR  x10 SKTC x5 10"  [facility] Adult PT Treatment:                                                DATE: 09/18/22 Therapeutic Exercise: Nustep 5 mins L6 UE/LE Seated forward roll outs c swiss ball forward and laterally  Seated PPTs c swiis ball press x15 3" Shoulder rows 2x15 GTB Manual Therapy: STM and DTM to the low back paraspinals and QL Skilled palpation to identify TrPs and taut muscle bands Trigger Point Dry Needling  Treatment: Pre-treatment instruction: Patient instructed on dry needling rationale, procedures, and possible side effects including pain during treatment (achy,cramping feeling), bruising, drop of blood, lightheadedness, nausea, sweating. Patient Consent Given: Yes Education handout provided: Previously provided Muscles treated: Bilat lumbar paraspinals L1 and L4 Needle size and number: .30x.80m, 4 Electrical stimulation performed: No Parameters: N/A Treatment response/outcome: Treatment response: muscle twitch Post-treatment instructions: Patient instructed to expect possible mild to moderate muscle soreness later today and/or tomorrow. Patient instructed in methods to reduce muscle soreness and to continue prescribed HEP. If patient was dry needled over the lung field, patient was instructed on signs and symptoms of pneumothorax and, however unlikely, to see immediate medical attention should they occur. Patient was also educated on signs and symptoms of infection and to seek medical attention should they occur. Patient verbalized understanding of these instructions and education.    PATIENT EDUCATION:  Education details: Eval findings, POC, HEP, self care Person educated: Patient, Parent, Spouse, and Child(ren) Education method: Explanation, Demonstration, Tactile cues, Verbal cues, and Handouts Education comprehension: verbalized understanding, returned demonstration, verbal cues required, and tactile cues required   HOME EXERCISE PROGRAM: Access Code: [email or web address] URL: [email or web address] Date: 09/25/2022 Prepared by: AGar Ponto Exercises - Supine Lower Trunk Rotation  - 1-2 x daily - 7 x weekly - 1 sets - 5 reps - 5 hold - Supine Piriformis Stretch with Foot on Ground  - 1-2 x daily - 7 x weekly - 3 reps - 15 hold - Hooklying Single Knee to Chest  - 1-2 x daily - 7 x weekly - 1 sets - 3 reps - 15 hold - Supine Bridge  - 1-2 x daily - 7 x weekly - 1 sets - 10 reps - 3  hold - Hooklying Clamshell with Resistance  - 1 x daily - 7 x weekly - 1 sets - 10 reps - 3 hold - Supine Hip Adduction Isometric with Ball  - 1 x daily - 7 x weekly - 1 sets - 10 reps - 3 hold - Seated Flexion Stretch with Swiss Ball  - 1 x daily - 7 x weekly - 1 sets - 6 reps - 20 hold - Seated Abdominal Press into S[employer] - 1 x daily - 7 x weekly - 1 sets - 10 reps - 3 hold - Standing Hip Flexor Stretch  - 1 x daily - 7 x weekly - 1 sets - 10 reps - 20 hold - Supine Posterior Pelvic Tilt  - 1 x daily - 7 x weekly - 1 sets - 10 reps - 3 hold Added 10/02/22:  - Seated March with Resistance  - 1 x daily - 7 x weekly - 3 sets - 10 reps - seated clam with resistance band  - 1 x daily - 7 x weekly - 3 sets -  10 reps   ASSESSMENT:   CLINICAL IMPRESSION:.  PT was completed for lumbopelvic flexibility and strengthening focused on impairments associated with hip weakness and pain with lumbar motion. Lumbar AROM is somewhat improved since first measured however still painful in most directions. Reviewed Seated stretches for lumbar she can perform while visiting her son in the hospital.   Pt tolerated PT today without adverse effects.  Will continue skilled PT to address impairments for improved function with decreased L hip and low back pain.   OBJECTIVE IMPAIRMENTS: decreased activity tolerance, decreased balance, difficulty walking, decreased ROM, decreased strength, increased muscle spasms, obesity, and pain.    ACTIVITY LIMITATIONS: carrying, lifting, bending, sitting, standing, squatting, sleeping, stairs, and locomotion level   PARTICIPATION LIMITATIONS: meal prep, cleaning, laundry, shopping, and community activity   PERSONAL FACTORS: Fitness, Past/current experiences, Time since onset of injury/illness/exacerbation, and 3+ comorbidities:    Mod limited, pulling pain R hipare also affecting patient's functional outcome.    REHAB POTENTIAL: Good   CLINICAL DECISION MAKING:  Evolving/moderate complexity   EVALUATION COMPLEXITY: Moderate     GOALS:   SHORT TERM GOALS: Target date: 08/24/22   Pt will be Ind in an initial HEP  Baseline: initiated Status: proper completion Goal status: MET   2.  Pt will voice understanding of measures to assist in pain reduction Baseline: initiated Status: HEP and sleeping positions with support; use of cold pack Goal status: MET   LONG TERM GOALS: Target date: 11/06/22   Pt will be Ind in a final HEP to maintain achieved LOF Baseline: initiated Goal status: ONGOING   2.  Pt's trunk ROM will improve to minimal or less for improved function and as demonstration of decreased pain Baseline: see flow sheets 10/10/22: Lumbar AROM minimally improved and with min pain -see chart Goal status: ONGOING   3.  Pt will report a decreased in pain to 3/10 or less with daily activities Baseline: 6-8/10 Status: 09/11/22= 2/10 10/10/22: 2/10 on average Goal status: MET   4.  Pt L hip will demonstrate ROM and strength equal to that of the R hip for improved function Baseline: pain with all passive movements 10/02/22: PROM equal bilateral with min stretch , left hip weak compared to right Goal status: PARTIALLY MET   5. Improve 5xSTS by MCID of 5" and 2MWT by MCID of 79f as indication of improved functional mobility  Baseline: TBA STATUS= 09/18/22= 29.2" (low mat, with UE) Goal status: MET for 5xSTS and 2 MWT   6.  Pt's FOTO score will improved to the predicted value of 55% as indication of improved function  Baseline: 38% 10/05/22: 54% Goal status: Improved   PLAN:   PT FREQUENCY: 2x/week   PT DURATION: 6 weeks   PLANNED INTERVENTIONS: Therapeutic exercises, Therapeutic activity, Neuromuscular re-education, Balance training, Gait training, Patient/Family education, Self Care, Joint mobilization, Stair training, Aquatic Therapy, Dry Needling, Electrical stimulation, Spinal manipulation, Spinal mobilization, Cryotherapy, Moist  heat, Taping, Traction, Ultrasound, Ionotophoresis 45mml Dexamethasone, Manual therapy, and Re-evaluation.   PLAN FOR NEXT SESSION: ; L hip strength and lumbar flexibility for completion of goals;   Pt has pacemaker, NO IONTO or ESWeston AnnaPTA 10/10/22 10:54 AM Phone: [phone]: [phone]

## 2022-10-12 ENCOUNTER — Encounter: Payer: Self-pay | Admitting: Nurse Practitioner

## 2022-10-12 ENCOUNTER — Ambulatory Visit: Payer: Medicare Other | Attending: Nurse Practitioner | Admitting: Nurse Practitioner

## 2022-10-12 VITALS — BP 112/68 | HR 86 | Ht 66.5 in | Wt 214.2 lb

## 2022-10-12 DIAGNOSIS — R002 Palpitations: Secondary | ICD-10-CM | POA: Insufficient documentation

## 2022-10-12 DIAGNOSIS — I700 Atherosclerosis of aorta: Secondary | ICD-10-CM | POA: Insufficient documentation

## 2022-10-12 DIAGNOSIS — I428 Other cardiomyopathies: Secondary | ICD-10-CM | POA: Diagnosis present

## 2022-10-12 DIAGNOSIS — I10 Essential (primary) hypertension: Secondary | ICD-10-CM | POA: Insufficient documentation

## 2022-10-12 DIAGNOSIS — E785 Hyperlipidemia, unspecified: Secondary | ICD-10-CM | POA: Diagnosis present

## 2022-10-12 DIAGNOSIS — I5042 Chronic combined systolic (congestive) and diastolic (congestive) heart failure: Secondary | ICD-10-CM | POA: Insufficient documentation

## 2022-10-12 DIAGNOSIS — I472 Ventricular tachycardia, unspecified: Secondary | ICD-10-CM | POA: Insufficient documentation

## 2022-10-12 DIAGNOSIS — Z95810 Presence of automatic (implantable) cardiac defibrillator: Secondary | ICD-10-CM | POA: Diagnosis present

## 2022-10-12 NOTE — Patient Instructions (Signed)
Medication Instructions:   Your physician recommends that you continue on your current medications as directed. Please refer to the Current Medication list given to you today.   *If you need a refill on your cardiac medications before your next appointment, please call your pharmacy*   Lab Work:  None ordered.  If you have labs (blood work) drawn today and your tests are completely normal, you will receive your results only by: [place] (if you have MyChart) OR A paper copy in the mail If you have any lab test that is abnormal or we need to change your treatment, we will call you to review the results.   Testing/Procedures:  None ordered.   Follow-Up: At [facility], you and your health needs are our priority.  As part of our continuing mission to provide you with exceptional heart care, we have created designated Provider Care Teams.  These Care Teams include your primary Cardiologist (physician) and Advanced Practice Providers (APPs -  Physician Assistants and Nurse Practitioners) who all work together to provide you with the care you need, when you need it.  We recommend signing up for the patient portal called "MyChart".  Sign up information is provided on this After Visit Summary.  MyChart is used to connect with patients for Virtual Visits (Telemedicine).  Patients are able to view lab/test results, encounter notes, upcoming appointments, etc.  Non-urgent messages can be sent to your provider as well.   To learn more about what you can do with MyChart, go to [email or web address].    Your next appointment:   3 week(s)  Provider:   You may see Virl Axe, MD or one of the following Advanced Practice Providers on your designated Care Team:   Tommye Standard, [place], Please come fasting to this appointment. Nothing to eat or drink except water after midnight.   You will see Dr. Irish Lack in September.

## 2022-10-16 ENCOUNTER — Ambulatory Visit: Payer: Medicare Other

## 2022-10-16 DIAGNOSIS — R252 Cramp and spasm: Secondary | ICD-10-CM

## 2022-10-16 DIAGNOSIS — M6281 Muscle weakness (generalized): Secondary | ICD-10-CM

## 2022-10-16 DIAGNOSIS — M545 Low back pain, unspecified: Secondary | ICD-10-CM

## 2022-10-16 DIAGNOSIS — M25552 Pain in left hip: Secondary | ICD-10-CM

## 2022-10-16 NOTE — Therapy (Signed)
OUTPATIENT PHYSICAL THERAPY TREATMENT NOTE   Patient Name: [name] MRN: [id number] DOB:11/21/1950, 72 y.o., female Today's Date: 10/16/2022  PCP: Camillia Herter, NP   REFERRING PROVIDER: Dorna Mai, MD  See note below for Objective Data and Assessment of Progress/Goals.      END OF SESSION:   PT End of Session - 10/16/22 0859     Visit Number 14    Number of Visits 22    Date for PT Re-Evaluation 11/06/22    Authorization Type MEDICARE PART A AND B    Progress Note Due on Visit 20    PT Start Time 0850    PT Stop Time 0930    PT Time Calculation (min) 40 min    Activity Tolerance Patient tolerated treatment well    Behavior During Therapy WFL for tasks assessed/performed                 Past Medical History:  Diagnosis Date   AICD (automatic cardioverter/defibrillator) present    Anxiety    Arthritis    Back pain    Breast cancer ([place]) 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   Bronchiectasis ([place])    Cerebral aneurysm, nonruptured    had a clip put in   CHF (congestive heart failure) (HCC)    Clostridium difficile infection    Depressive disorder, not elsewhere classified    Diverticulosis of colon (without mention of hemorrhage)    Esophageal reflux    Fatty liver    Fibromyalgia    Gastritis    GERD (gastroesophageal reflux disease)    GI bleed 2004   Glaucoma    Hiatal hernia    History of COVID-19 05/04/2021   Hyperlipidemia    Hypertension    Hypothyroidism    Internal hemorrhoids    Joint pain    Multifocal pneumonia 06/03/2021   Obstructive sleep apnea (adult) (pediatric)    Osteoarthritis    Ostium secundum type atrial septal defect    Other chronic nonalcoholic liver disease    Other pulmonary embolism and infarction    Palpitations    Paroxysmal ventricular tachycardia (HCC)    Personal history of radiation therapy    PONV (postoperative nausea and vomiting)    Presence of permanent cardiac pacemaker    PUD  (peptic ulcer disease)    Radiation 02/03/15-03/10/15   Right Breast   Sarcoid    per pt , not sure   Schatzki's ring    Shortness of breath    Sleep apnea    Stroke [facility]) 2013   tia/ pt feels it was around 2008 0r 2009   Takotsubo syndrome    Tubular adenoma of colon    Unspecified transient cerebral ischemia    Unspecified vitamin D deficiency    Past Surgical History:  Procedure Laterality Date   ABI  2006   normal   BREAST EXCISIONAL BIOPSY     BREAST LUMPECTOMY Right 2016   BREAST LUMPECTOMY WITH RADIOACTIVE SEED LOCALIZATION Right 01/03/2015   Procedure: BREAST LUMPECTOMY WITH RADIOACTIVE SEED LOCALIZATION;  Surgeon: Autumn Messing III, MD;  Location: [facility];  Service: General;  Laterality: Right;   CARDIAC DEFIBRILLATOR PLACEMENT  2006; 2012   BSX single chamber ICD   carotid dopplers  2006   neg   CEREBRAL ANEURYSM REPAIR  02/1999   COLONOSCOPY     HAMMER TOE SURGERY  11/15/2020   3rd digit bilateral feet    hospitalization  2004  GI bleed, PUD, diverticulosis (EGD,colonscopy)   hospitalization     PE, NSVT, s/p defib   KNEE ARTHROSCOPY     bilateral   LEFT HEART CATHETERIZATION WITH CORONARY ANGIOGRAM N/A 02/22/2012   Procedure: LEFT HEART CATHETERIZATION WITH CORONARY ANGIOGRAM;  Surgeon: Burnell Blanks, MD;  Location: [facility] CATH LAB;  Service: Cardiovascular;  Laterality: N/A;   PARTIAL HYSTERECTOMY     Fibroids   TONGUE BIOPSY  12/12/2017   due to sore tongue and white patches/abnormal cells   TOTAL KNEE ARTHROPLASTY Left 02/13/2016   Procedure: TOTAL KNEE ARTHROPLASTY;  Surgeon: Vickey Huger, MD;  Location: [facility];  Service: Orthopedics;  Laterality: Left;   TOTAL KNEE ARTHROPLASTY Right 05/22/2021   Procedure: TOTAL KNEE ARTHROPLASTY;  Surgeon: Vickey Huger, MD;  Location: [facility];  Service: Orthopedics;  Laterality: Right;  with block   UPPER GASTROINTESTINAL ENDOSCOPY     Patient Active Problem List   Diagnosis Date Noted   Partial symptomatic epilepsy  with complex partial seizures, not intractable, without status epilepticus ([place]) 08/10/2022   Low back pain 08/10/2022   Gait abnormality 05/04/2022   Muscle spasm 05/04/2022   Upper airway cough syndrome 01/19/2022   Chronic left shoulder pain 11/13/2021   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   Localized swelling of right lower leg 08/29/2021   Tonsillar exudate 08/29/2021   Candida vaginitis 08/28/2021   Chronic pain syndrome 08/25/2021   Multifocal pneumonia 06/03/2021   Pre-operative respiratory examination 05/18/2021   Statin myopathy 04/19/2021   Pain in joint of right shoulder 02/10/2021   Arthritis of both acromioclavicular joints 01/02/2021   Trochanteric bursitis of right hip 10/03/2020   Tailbone injury 05/05/2020   UTI (urinary tract infection) 05/05/2020   Insomnia 05/05/2020   Leg cramps 02/16/2020   Dysuria 02/16/2020   Right hip impingement syndrome 02/01/2020   Right bicipital tenosynovitis 02/01/2020   Bronchiectasis without complication ([place]) 0000000   Myofascial pain 12/21/2019   Estrogen deficiency 12/10/2019   Obesity (BMI 30-39.9) 12/10/2019   Bronchiectasis with (acute) exacerbation ([place]) 10/23/2019   Elevated LFTs 10/15/2019   Lower abdominal pain 09/01/2019   Neck pain 10/25/2017   Paresthesias in left hand 10/25/2017   Paresthesia of foot, bilateral 04/12/2017   Primary osteoarthritis of both hands 09/27/2016   Primary osteoarthritis of both knees 09/27/2016   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   Nasopharyngitis, 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/2016   S/P total knee replacement 02/13/2016   Chronic pain of both knees 12/06/2015   Globus pharyngeus 05/17/2015   Genetic testing 12/23/2014   Family history of breast cancer    Family history of colon cancer    Family history of pancreatic cancer    History of breast cancer 11/23/2014   Allergy to ACE inhibitors 09/27/2014   Prediabetes 06/23/2013   Muscle spasms of both lower extremities 01/15/2013   Hx of  Clostridium difficile infection 10/10/2012   Dyspnea on exertion 04/02/2012   NICM (nonischemic cardiomyopathy) ([place]) 03/07/2012   Post-menopausal 01/11/2012   Atypical chest pain 11/07/2011   Obstructive sleep apnea 04/04/2010   V-tach ([place]) 01/03/2009   ICD  [employer]  Single chamber 01/03/2009   PFO (patent foramen ovale) 09/29/2008   Vitamin D deficiency 08/17/2008   Hypothyroidism 11/18/2006   Hyperlipidemia 11/18/2006   Depression with anxiety 11/18/2006   Essential hypertension 11/18/2006   Mitral valve prolapse 11/18/2006   Cerebral aneurysm 11/18/2006   Allergic rhinitis 11/18/2006   GERD 11/18/2006   Diverticulosis of colon 11/18/2006   Fatty liver 11/18/2006  Fibromyalgia 11/18/2006    REFERRING DIAG: M54.50 (ICD-10-CM) - Left-sided low back pain without sciatica, unspecified chronicity; M25.552 (ICD-10-CM) - Left hip pain  THERAPY DIAG:  Pain in left hip  Muscle weakness (generalized)  Chronic bilateral low back pain without sciatica  Cramp and spasm  Rationale for Evaluation and Treatment Rehabilitation  PERTINENT HISTORY:  Automatic cardioverter/defibrillator; Bilat knee replacements; arthritis; fibromyalgia; depression, High BMI  PRECAUTIONS: Pacemaker  SUBJECTIVE:                                                                                                                                                                                      SUBJECTIVE STATEMENT:    PAIN:  Are you having pain? Yes: NPRS scale: 2/10 for L hip; 2/10 for low back Pain location: L lowback, gluteal, lat hip, groin, and ant thigh Pain description: ache, sharp occasionally, constant Aggravating factors: Walking, sleeping, in/out of car, bending over Relieving factors: Muscle relaxors, heat pad, tylenol On Eval pain range=6-8/10   OBJECTIVE: (objective measures completed at initial evaluation unless otherwise dated)   Pt reports xrays were taken with [facility]  yesterday. He was able to recall there was no fracture, proper spacing for the joint   PATIENT SURVEYS:  FOTO: Perceived function   38%, predicted   55%  09/04/22: 54%  10/02/22: 54%   SCREENING FOR RED FLAGS: Bowel or bladder incontinence: No Spinal tumors: No Compression fracture: No   COGNITION: Overall cognitive status: Within functional limits for tasks assessed                          SENSATION: WFL   MUSCLE LENGTH: Hamstrings: Right NT deg; Left NT deg Marcello Moores test: Right NT deg; Left NT deg   POSTURE: increased lumbar lordosis   PALPATION: TTP with increased muscle tightness to the L gluteal region   LUMBAR ROM:    AROM eval 10/10/22  Flexion Mod limited, low back pulling pain Reaches min shins, low back pain  Extension Mod limited, pain low back and L hip Min limited, pain on low back  Right lateral flexion Mod limited, pulling pain R hip Min limited , pain in left hip  Left lateral flexion Mod limited, pain across low back Min limited, min low back pain   Right rotation Mod limited, pulling pain R hip and across low back WFL min pain  Left rotation Mod limited, pain across low back WFL , min low back    (Blank rows = not tested)   LOWER EXTREMITY ROM:     L decreased in comparison to R  due to pain Passive  Right  eval Left eval Left 10/02/22  Hip flexion   P WFL  Hip extension   P   Hip abduction   P WFL  Hip adduction       Hip internal rotation   P WFL  Hip external rotation   P WFL  Knee flexion       Knee extension       Ankle dorsiflexion       Ankle plantarflexion       Ankle inversion       Ankle eversion       P=pain  (Blank rows = not tested)   LOWER EXTREMITY MMT:   L LE strength is deceased in comparison to R due to Pain MMT Right eval Left eval Right 10/02/22 Left 10/02/22  Hip flexion   P 4+ 4-  Hip extension   P    Hip abduction   P 4- 4  Hip adduction   P    Hip internal rotation   P    Hip external rotation   P    Knee  flexion        Knee extension        Ankle dorsiflexion        Ankle plantarflexion        Ankle inversion        Ankle eversion        P=pain  (Blank rows = not tested)   LUMBAR SPECIAL TESTS:  Straight leg raise test: Negative, Slump test: Negative, and SI Compression/distraction test: Negative          FADER L hip positive   FUNCTIONAL TESTS:  5 times sit to stand: 08/17/22: 40.8 sec 09/18/22= 29.2" (low mat, with UE): 10/02/22: 23.3 sec without UE from standard mat 2 minute walk test: 08/17/22: 185 Feet : 10/02/22: 308 feet    GAIT: Distance walked: 144f Assistive device utilized: Single point cane Level of assistance: Complete Independence Comments: decreased pace   TODAY'S TREATMENT: [facility] Adult PT Treatment:                                                DATE: 10/16/22 Therapeutic Exercise: Nustep L5 UE/LE x 5 minutes  Seated trunk flexion forward and laterally Seated neural glide x10 each Seated hamstring stretch x2 20" each Seated ball press for abs 2x10, vc for breathing STS from min elevated bari-mat 2x5 Paloff press RTB x10 each UE/back ext RTB 2x10  [facility] Adult PT Treatment:                                                DATE: 10/10/22 Therapeutic Exercise: Nustep L5 UE/Le x 5 minutes  Lumbar AROM Seated Lumbar stretches: side bend , rotation, lumbar flexion, hamstring stretches: options reviewed to perform while visiting son in hospital.  SKTC,  LTR Bridge -min rom  [facility]                                                [facility]: [facility] Therapeutic Exercise: Seated blue band clam Seated march blue  band  Hip abduction x 10 each   Therapeutic Activity: 5 x STS 2 MWT MMT FOTO  [facility] Adult PT Treatment:                                                DATE: 09/25/22 Therapeutic Exercise: Nustep 5 mins L6 LE Seated forward roll outs c swiss ball forward and laterally  Seated PPTs c swiis ball press x15 3" PPT x15 3" Abdominal bracing x10  5" LTR x10 SKTC x5 10"  [facility] Adult PT Treatment:                                                DATE: 09/18/22 Therapeutic Exercise: Nustep 5 mins L6 UE/LE Seated forward roll outs c swiss ball forward and laterally  Seated PPTs c swiis ball press x15 3" Shoulder rows 2x15 GTB Manual Therapy: STM and DTM to the low back paraspinals and QL Skilled palpation to identify TrPs and taut muscle bands Trigger Point Dry Needling Treatment: Pre-treatment instruction: Patient instructed on dry needling rationale, procedures, and possible side effects including pain during treatment (achy,cramping feeling), bruising, drop of blood, lightheadedness, nausea, sweating. Patient Consent Given: Yes Education handout provided: Previously provided Muscles treated: Bilat lumbar paraspinals L1 and L4 Needle size and number: .30x.32m, 4 Electrical stimulation performed: No Parameters: N/A Treatment response/outcome: Treatment response: muscle twitch Post-treatment instructions: Patient instructed to expect possible mild to moderate muscle soreness later today and/or tomorrow. Patient instructed in methods to reduce muscle soreness and to continue prescribed HEP. If patient was dry needled over the lung field, patient was instructed on signs and symptoms of pneumothorax and, however unlikely, to see immediate medical attention should they occur. Patient was also educated on signs and symptoms of infection and to seek medical attention should they occur. Patient verbalized understanding of these instructions and education.    PATIENT EDUCATION:  Education details: Eval findings, POC, HEP, self care Person educated: Patient Education method: Explanation, Demonstration, Tactile cues, Verbal cues, and Handouts Education comprehension: verbalized understanding, returned demonstration, verbal cues required, and tactile cues required   HOME EXERCISE PROGRAM: Access Code: [email or web address] URL:  [email or web address] Date: 09/25/2022 Prepared by: AGar Ponto Exercises - Supine Lower Trunk Rotation  - 1-2 x daily - 7 x weekly - 1 sets - 5 reps - 5 hold - Supine Piriformis Stretch with Foot on Ground  - 1-2 x daily - 7 x weekly - 3 reps - 15 hold - Hooklying Single Knee to Chest  - 1-2 x daily - 7 x weekly - 1 sets - 3 reps - 15 hold - Supine Bridge  - 1-2 x daily - 7 x weekly - 1 sets - 10 reps - 3 hold - Hooklying Clamshell with Resistance  - 1 x daily - 7 x weekly - 1 sets - 10 reps - 3 hold - Supine Hip Adduction Isometric with Ball  - 1 x daily - 7 x weekly - 1 sets - 10 reps - 3 hold - Seated Flexion Stretch with Swiss Ball  - 1 x daily - 7 x weekly - 1 sets - 6 reps - 20 hold - Seated Abdominal Press into [employer] - 1 x  daily - 7 x weekly - 1 sets - 10 reps - 3 hold - Standing Hip Flexor Stretch  - 1 x daily - 7 x weekly - 1 sets - 10 reps - 20 hold - Supine Posterior Pelvic Tilt  - 1 x daily - 7 x weekly - 1 sets - 10 reps - 3 hold Added 10/02/22:  - Seated March with Resistance  - 1 x daily - 7 x weekly - 3 sets - 10 reps - seated clam with resistance band  - 1 x daily - 7 x weekly - 3 sets - 10 reps   ASSESSMENT:   CLINICAL IMPRESSION:.  PT was completed for lumbopelvic flexibility and strengthening. Verbal cueing was provided to minimize holding of breath with abdominal contractions. Pt reported band exs in standing were challenging. Overall, pain and function are improving slowly. Will continue skilled PT to address impairments for improved function with decreased L hip and low back pain.   OBJECTIVE IMPAIRMENTS: decreased activity tolerance, decreased balance, diffculity walking, decreased ROM, decreased strength, increased muscle spasms, obesity, and pain.    ACTIVITY LIMITATIONS: carrying, lifting, bending, sitting, standing, squatting, sleeping, stairs, and locomotion level   PARTICIPATION LIMITATIONS: meal prep, cleaning, laundry, shopping, and  community activity   PERSONAL FACTORS: Fitness, Past/current experiences, Time since onset of injury/illness/exacerbation, and 3+ comorbidities:    Mod limited, pulling pain R hipare also affecting patient's functional outcome.    REHAB POTENTIAL: Good   CLINICAL DECISION MAKING: Evolving/moderate complexity   EVALUATION COMPLEXITY: Moderate     GOALS:   SHORT TERM GOALS: Target date: 08/24/22   Pt will be Ind in an initial HEP  Baseline: initiated Status: proper completion Goal status: MET   2.  Pt will voice understanding of measures to assist in pain reduction Baseline: initiated Status: HEP and sleeping positions with support; use of cold pack Goal status: MET   LONG TERM GOALS: Target date: 11/06/22   Pt will be Ind in a final HEP to maintain achieved LOF Baseline: initiated Goal status: ONGOING   2.  Pt's trunk ROM will improve to minimal or less for improved function and as demonstration of decreased pain Baseline: see flow sheets 10/10/22: Lumbar AROM minimally improved and with min pain -see chart Goal status: ONGOING   3.  Pt will report a decreased in pain to 3/10 or less with daily activities Baseline: 6-8/10 Status: 09/11/22= 2/10 10/10/22: 2/10 on average Goal status: MET   4.  Pt L hip will demonstrate ROM and strength equal to that of the R hip for improved function Baseline: pain with all passive movements 10/02/22: PROM equal bilateral with min stretch , left hip weak compared to right Goal status: PARTIALLY MET   5. Improve 5xSTS by MCID of 5" and 2MWT by MCID of 23f as indication of improved functional mobility  Baseline: TBA STATUS= 09/18/22= 29.2" (low mat, with UE) Goal status: MET for 5xSTS and 2 MWT   6.  Pt's FOTO score will improved to the predicted value of 55% as indication of improved function  Baseline: 38% 10/05/22: 54% Goal status: Improved   PLAN:   PT FREQUENCY: 2x/week   PT DURATION: 6 weeks   PLANNED INTERVENTIONS:  Therapeutic exercises, Therapeutic activity, Neuromuscular re-education, Balance training, Gait training, Patient/Family education, Self Care, Joint mobilization, Stair training, Aquatic Therapy, Dry Needling, Electrical stimulation, Spinal manipulation, Spinal mobilization, Cryotherapy, Moist heat, Taping, Traction, Ultrasound, Ionotophoresis '4mg'$ /ml Dexamethasone, Manual therapy, and Re-evaluation.   PLAN FOR NEXT  SESSION: ; L hip strength and lumbar flexibility for completion of goals;   Pt has pacemaker, NO IONTO or ESTIM   Edahi Kroening MS, PT 10/16/22 1:22 PM

## 2022-10-17 ENCOUNTER — Other Ambulatory Visit: Payer: Self-pay | Admitting: Family

## 2022-10-17 DIAGNOSIS — E038 Other specified hypothyroidism: Secondary | ICD-10-CM

## 2022-10-23 ENCOUNTER — Ambulatory Visit: Payer: Medicare Other | Attending: Family Medicine

## 2022-10-23 DIAGNOSIS — M25561 Pain in right knee: Secondary | ICD-10-CM | POA: Insufficient documentation

## 2022-10-23 DIAGNOSIS — M25552 Pain in left hip: Secondary | ICD-10-CM | POA: Insufficient documentation

## 2022-10-23 DIAGNOSIS — G8929 Other chronic pain: Secondary | ICD-10-CM | POA: Diagnosis present

## 2022-10-23 DIAGNOSIS — M25661 Stiffness of right knee, not elsewhere classified: Secondary | ICD-10-CM | POA: Diagnosis present

## 2022-10-23 DIAGNOSIS — M6281 Muscle weakness (generalized): Secondary | ICD-10-CM | POA: Insufficient documentation

## 2022-10-23 DIAGNOSIS — M25512 Pain in left shoulder: Secondary | ICD-10-CM | POA: Insufficient documentation

## 2022-10-23 DIAGNOSIS — M545 Low back pain, unspecified: Secondary | ICD-10-CM | POA: Insufficient documentation

## 2022-10-23 DIAGNOSIS — R252 Cramp and spasm: Secondary | ICD-10-CM | POA: Insufficient documentation

## 2022-10-23 NOTE — Therapy (Signed)
OUTPATIENT PHYSICAL THERAPY TREATMENT NOTE/L Shoulder Eval   Patient Name: [name] MRN: [id number] DOB:February 24, 1951, 72 y.o., female Today's Date: 10/23/2022  PCP: Camillia Herter, NP   REFERRING PROVIDER: Dorna Mai, MD; Clance Boll Gasper Sells, PA-C   See note below for Objective Data and Assessment of Progress/Goals.      END OF SESSION:   PT End of Session - 10/23/22 0937     Visit Number 15    Number of Visits 27    Date for PT Re-Evaluation 12/07/22    Authorization Type MEDICARE PART A AND B    Progress Note Due on Visit 20    PT Start Time 0934    PT Stop Time 1017    PT Time Calculation (min) 43 min    Activity Tolerance Patient tolerated treatment well    Behavior During Therapy WFL for tasks assessed/performed                 Past Medical History:  Diagnosis Date   AICD (automatic cardioverter/defibrillator) present    Anxiety    Arthritis    Back pain    Breast cancer ([name]) 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   Bronchiectasis ([place])    Cerebral aneurysm, nonruptured    had a clip put in   CHF (congestive heart failure) (HCC)    Clostridium difficile infection    Depressive disorder, not elsewhere classified    Diverticulosis of colon (without mention of hemorrhage)    Esophageal reflux    Fatty liver    Fibromyalgia    Gastritis    GERD (gastroesophageal reflux disease)    GI bleed 2004   Glaucoma    Hiatal hernia    History of COVID-19 05/04/2021   Hyperlipidemia    Hypertension    Hypothyroidism    Internal hemorrhoids    Joint pain    Multifocal pneumonia 06/03/2021   Obstructive sleep apnea (adult) (pediatric)    Osteoarthritis    Ostium secundum type atrial septal defect    Other chronic nonalcoholic liver disease    Other pulmonary embolism and infarction    Palpitations    Paroxysmal ventricular tachycardia (HCC)    Personal history of radiation therapy    PONV (postoperative nausea and vomiting)     Presence of permanent cardiac pacemaker    PUD (peptic ulcer disease)    Radiation 02/03/15-03/10/15   Right Breast   Sarcoid    per pt , not sure   Schatzki's ring    Shortness of breath    Sleep apnea    Stroke [facility]) 2013   tia/ pt feels it was around 2008 0r 2009   Takotsubo syndrome    Tubular adenoma of colon    Unspecified transient cerebral ischemia    Unspecified vitamin D deficiency    Past Surgical History:  Procedure Laterality Date   ABI  2006   normal   BREAST EXCISIONAL BIOPSY     BREAST LUMPECTOMY Right 2016   BREAST LUMPECTOMY WITH RADIOACTIVE SEED LOCALIZATION Right 01/03/2015   Procedure: BREAST LUMPECTOMY WITH RADIOACTIVE SEED LOCALIZATION;  Surgeon: Autumn Messing III, MD;  Location: [facility];  Service: General;  Laterality: Right;   CARDIAC DEFIBRILLATOR PLACEMENT  2006; 2012   BSX single chamber ICD   carotid dopplers  2006   neg   CEREBRAL ANEURYSM REPAIR  02/1999   COLONOSCOPY     HAMMER TOE SURGERY  11/15/2020   3rd digit bilateral feet  hospitalization  2004   GI bleed, PUD, diverticulosis (EGD,colonscopy)   hospitalization     PE, NSVT, s/p defib   KNEE ARTHROSCOPY     bilateral   LEFT HEART CATHETERIZATION WITH CORONARY ANGIOGRAM N/A 02/22/2012   Procedure: LEFT HEART CATHETERIZATION WITH CORONARY ANGIOGRAM;  Surgeon: Burnell Blanks, MD;  Location: [facility] CATH LAB;  Service: Cardiovascular;  Laterality: N/A;   PARTIAL HYSTERECTOMY     Fibroids   TONGUE BIOPSY  12/12/2017   due to sore tongue and white patches/abnormal cells   TOTAL KNEE ARTHROPLASTY Left 02/13/2016   Procedure: TOTAL KNEE ARTHROPLASTY;  Surgeon: Vickey Huger, MD;  Location: [place] [facility];  Service: Orthopedics;  Laterality: Left;   TOTAL KNEE ARTHROPLASTY Right 05/22/2021   Procedure: TOTAL KNEE ARTHROPLASTY;  Surgeon: Vickey Huger, MD;  Location: [facility];  Service: Orthopedics;  Laterality: Right;  with block   UPPER GASTROINTESTINAL ENDOSCOPY     Patient Active Problem List    Diagnosis Date Noted   Partial symptomatic epilepsy with complex partial seizures, not intractable, without status epilepticus ([name]) 08/10/2022   Low back pain 08/10/2022   Gait abnormality 05/04/2022   Muscle spasm 05/04/2022   Upper airway cough syndrome 01/19/2022   Chronic left shoulder pain 11/13/2021   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   Localized swelling of right lower leg 08/29/2021   Tonsillar exudate 08/29/2021   Candida vaginitis 08/28/2021   Chronic pain syndrome 08/25/2021   Multifocal pneumonia 06/03/2021   Pre-operative respiratory examination 05/18/2021   Statin myopathy 04/19/2021   Pain in joint of right shoulder 02/10/2021   Arthritis of both acromioclavicular joints 01/02/2021   Trochanteric bursitis of right hip 10/03/2020   Tailbone injury 05/05/2020   UTI (urinary tract infection) 05/05/2020   Insomnia 05/05/2020   Leg cramps 02/16/2020   Dysuria 02/16/2020   Right hip impingement syndrome 02/01/2020   Right bicipital tenosynovitis 02/01/2020   Bronchiectasis without complication ([name]) 0000000   Myofascial pain 12/21/2019   Estrogen deficiency 12/10/2019   Obesity (BMI 30-39.9) 12/10/2019   Bronchiectasis with (acute) exacerbation ([name]) 10/23/2019   Elevated LFTs 10/15/2019   Lower abdominal pain 09/01/2019   Neck pain 10/25/2017   Paresthesias in left hand 10/25/2017   Paresthesia of foot, bilateral 04/12/2017   Primary osteoarthritis of both hands 09/27/2016   Primary osteoarthritis of both knees 09/27/2016   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   Nasopharyngitis, 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/2016   S/P total knee replacement 02/13/2016   Chronic pain of both knees 12/06/2015   Globus pharyngeus 05/17/2015   Genetic testing 12/23/2014   Family history of breast cancer    Family history of colon cancer    Family history of pancreatic cancer    History of breast cancer 11/23/2014   Allergy to ACE inhibitors 09/27/2014   Prediabetes 06/23/2013   Muscle  spasms of both lower extremities 01/15/2013   Hx of Clostridium difficile infection 10/10/2012   Dyspnea on exertion 04/02/2012   NICM (nonischemic cardiomyopathy) ([name]) 03/07/2012   Post-menopausal 01/11/2012   Atypical chest pain 11/07/2011   Obstructive sleep apnea 04/04/2010   V-tach ([place]) 01/03/2009   ICD  [employer]  Single chamber 01/03/2009   PFO (patent foramen ovale) 09/29/2008   Vitamin D deficiency 08/17/2008   Hypothyroidism 11/18/2006   Hyperlipidemia 11/18/2006   Depression with anxiety 11/18/2006   Essential hypertension 11/18/2006   Mitral valve prolapse 11/18/2006   Cerebral aneurysm 11/18/2006   Allergic rhinitis 11/18/2006   GERD 11/18/2006   Diverticulosis of colon 11/18/2006  Fatty liver 11/18/2006   Fibromyalgia 11/18/2006    REFERRING DIAG: M54.50 (ICD-10-CM) - Left-sided low back pain without sciatica, unspecified chronicity; M25.552 (ICD-10-CM) - Left hip pain; Pain in left shoulder; Presence of right artificial knee joint.  THERAPY DIAG:  Chronic left shoulder pain  Chronic pain of right knee  Rationale for Evaluation and Treatment Rehabilitation  PERTINENT HISTORY:  Automatic cardioverter/defibrillator; Bilat knee replacements; arthritis; fibromyalgia; depression, High BMI  PRECAUTIONS: Pacemaker  SUBJECTIVE:                                                                                                                                                                                      SUBJECTIVE STATEMENT:  Pt reports her L shoulder is hurting all the time. It has bothered her since the accident. The pain management MD provided and injection which was helpful.  PAIN:  Are you having pain? Yes: NPRS scale: 2/10 for L hip; 2/10 for low back Pain location: L lowback, gluteal, lat hip, groin, and ant thigh Pain description: ache, sharp occasionally, constant Aggravating factors: Walking, sleeping, in/out of car, bending over Relieving  factors: Muscle relaxors, heat pad, tylenol On Eval pain range=6-8/10  PAIN:  Are you having pain? Yes: NPRS scale: 5/10 Pain location: Superior GH jt and upper trap Pain description: ache, constant Aggravating factors: Arm movement c dressing and reaching up Relieving factors: Resting  OBJECTIVE: (objective measures completed at initial evaluation unless otherwise dated)  Pt reports xrays were taken with [facility] yesterday. He was able to recall there was no fracture, proper spacing for the joint   PATIENT SURVEYS:  FOTO: Perceived function   38%, predicted   55%  09/04/22: 54%  10/02/22: 54%   SCREENING FOR RED FLAGS: Bowel or bladder incontinence: No Spinal tumors: No Compression fracture: No   COGNITION: Overall cognitive status: Within functional limits for tasks assessed                          SENSATION: WFL   MUSCLE LENGTH: Hamstrings: Right NT deg; Left NT deg Marcello Moores test: Right NT deg; Left NT deg   POSTURE: increased lumbar lordosis   PALPATION: TTP with increased muscle tightness to the L gluteal region   LUMBAR ROM:    AROM eval 10/10/22  Flexion Mod limited, low back pulling pain Reaches min shins, low back pain  Extension Mod limited, pain low back and L hip Min limited, pain on low back  Right lateral flexion Mod limited, pulling pain R hip Min limited , pain in left hip  Left lateral flexion Mod limited, pain across low back Min limited, min  low back pain   Right rotation Mod limited, pulling pain R hip and across low back WFL min pain  Left rotation Mod limited, pain across low back WFL , min low back    (Blank rows = not tested)   LOWER EXTREMITY ROM:     L decreased in comparison to R  due to pain Passive  Right eval Left eval Left 10/02/22  Hip flexion   P WFL  Hip extension   P   Hip abduction   P WFL  Hip adduction       Hip internal rotation   P WFL  Hip external rotation   P WFL  Knee flexion       Knee extension       Ankle  dorsiflexion       Ankle plantarflexion       Ankle inversion       Ankle eversion       P=pain  (Blank rows = not tested)   LOWER EXTREMITY MMT:   L LE strength is deceased in comparison to R due to Pain MMT Right eval Left eval Right 10/02/22 Left 10/02/22  Hip flexion   P 4+ 4-  Hip extension   P    Hip abduction   P 4- 4  Hip adduction   P    Hip internal rotation   P    Hip external rotation   P    Knee flexion        Knee extension        Ankle dorsiflexion        Ankle plantarflexion        Ankle inversion        Ankle eversion        P=pain  (Blank rows = not tested)   LUMBAR SPECIAL TESTS:  Straight leg raise test: Negative, Slump test: Negative, and SI Compression/distraction test: Negative          FADER L hip positive   FUNCTIONAL TESTS:  5 times sit to stand: 08/17/22: 40.8 sec 09/18/22= 29.2" (low mat, with UE): 10/02/22: 23.3 sec without UE from standard mat 2 minute walk test: 08/17/22: 185 Feet : 10/02/22: 308 feet    GAIT: Distance walked: 182f Assistive device utilized: Single point cane Level of assistance: Complete Independence Comments: decreased pace  [place] Assessmnet:  SENSATION: WFL  POSTURE: Forward heads, rounded shoulders  UPPER EXTREMITY ROM:   Active ROM Right 10/23/2022 Left 10/23/2022  Shoulder flexion A120 pain, P130 pain 120  Shoulder extension    Shoulder abduction    Shoulder adduction    Shoulder internal rotation T7 pain T8  Shoulder external rotation T2 pain T3  Elbow flexion    Elbow extension    Wrist flexion    Wrist extension    Wrist ulnar deviation    Wrist radial deviation    Wrist pronation    Wrist supination    (Blank rows = not tested) C elevation pain increases at 80d and increases UPPER EXTREMITY MMT:  MMT Right 10/23/2022 Left 10/23/2022  Shoulder flexion 120 pain 120  Shoulder extension    Shoulder abduction    Shoulder adduction    Shoulder internal rotation    Shoulder external rotation T2  T3  Middle trapezius    Lower trapezius    Elbow flexion    Elbow extension    Wrist flexion    Wrist extension    Wrist ulnar deviation    Wrist  radial deviation    Wrist pronation    Wrist supination    Grip strength (lbs)    (Blank rows = not tested)  SHOULDER SPECIAL TESTS:  Impingement tests: Hawkins/Kennedy impingement test: positive   Instability tests:  Negative  Rotator cuff assessment: Empty can test: negative and Full can test: negative  Both negative for overt weakness, min provocation of pain.  Biceps assessment: Yergason's test: negative   No overt weakness JOINT MOBILITY TESTING:  WNLs  PALPATION:  TTP for the superior Tiki [name] area and upper trap    TODAY'S TREATMENT: [facility] Adult PT Treatment:                                                DATE: 10/23/22 Manual Therapy: STM/DTM to the upper trap  Modalities: MH to the L shoulder x10 mins for pain relief  [facility] Adult PT Treatment:                                                DATE: 10/16/22 Therapeutic Exercise: Nustep L5 UE/LE x 5 minutes  Seated trunk flexion forward and laterally Seated neural glide x10 each Seated hamstring stretch x2 20" each Seated ball press for abs 2x10, vc for breathing STS from min elevated bari-mat 2x5 Paloff press RTB x10 each UE/back ext RTB 2x10  [facility] Adult PT Treatment:                                                DATE: 10/10/22 Therapeutic Exercise: Nustep L5 UE/Le x 5 minutes  Lumbar AROM Seated Lumbar stretches: side bend , rotation, lumbar flexion, hamstring stretches: options reviewed to perform while visiting son in hospital.  Blunt,  LTR Bridge -min rom  [facility] Adult PT Treatment:                                                DATE: 10/02/22 Therapeutic Exercise: Seated blue band clam Seated march blue band  Hip abduction x 10 each   Therapeutic Activity: 5 x STS 2 MWT MMT FOTO  PATIENT EDUCATION:  Education details: Eval findings, POC, HEP, self care Person  educated: Patient Education method: Explanation, Demonstration, Tactile cues, Verbal cues, and Handouts Education comprehension: verbalized understanding, returned demonstration, verbal cues required, and tactile cues required   HOME EXERCISE PROGRAM: Access Code: [email or web address] URL: [email or web address] Date: 09/25/2022 Prepared by: Gar Ponto  Exercises - Supine Lower Trunk Rotation  - 1-2 x daily - 7 x weekly - 1 sets - 5 reps - 5 hold - Supine Piriformis Stretch with Foot on Ground  - 1-2 x daily - 7 x weekly - 3 reps - 15 hold - Hooklying Single Knee to Chest  - 1-2 x daily - 7 x weekly - 1 sets - 3 reps - 15 hold - Supine Bridge  - 1-2 x daily - 7 x weekly - 1 sets - 10 reps - 3 hold - Hooklying Clamshell with  Resistance  - 1 x daily - 7 x weekly - 1 sets - 10 reps - 3 hold - Supine Hip Adduction Isometric with Ball  - 1 x daily - 7 x weekly - 1 sets - 10 reps - 3 hold - Seated Flexion Stretch with Swiss Ball  - 1 x daily - 7 x weekly - 1 sets - 6 reps - 20 hold - Seated Abdominal Press into [employer]  - 1 x daily - 7 x weekly - 1 sets - 10 reps - 3 hold - Standing Hip Flexor Stretch  - 1 x daily - 7 x weekly - 1 sets - 10 reps - 20 hold - Supine Posterior Pelvic Tilt  - 1 x daily - 7 x weekly - 1 sets - 10 reps - 3 hold Added 10/02/22:  - Seated March with Resistance  - 1 x daily - 7 x weekly - 3 sets - 10 reps - seated clam with resistance band  - 1 x daily - 7 x weekly - 3 sets - 10 reps   ASSESSMENT:   CLINICAL IMPRESSION:.  Received referral from [place], Gasper Sells, PA-C for the L shoulder and for the R knee. The L shoulder was assessed today. Pt is symptomatic of possible impingement syndrome with upper trap musclular pain. STM/DTM was provided to the upper trap today f/b moist heat. Pt tolerated PT today without adverse effects. Pt will continue to benefit from skilled PT to address impairments for improved function   OBJECTIVE IMPAIRMENTS: decreased activity  tolerance, decreased balance, diffculity walking, decreased ROM, decreased strength, increased muscle spasms, obesity, and pain.    ACTIVITY LIMITATIONS: carrying, lifting, bending, sitting, standing, squatting, sleeping, stairs, and locomotion level   PARTICIPATION LIMITATIONS: meal prep, cleaning, laundry, shopping, and community activity   PERSONAL FACTORS: Fitness, Past/current experiences, Time since onset of injury/illness/exacerbation, and 3+ comorbidities:    Mod limited, pulling pain R hipare also affecting patient's functional outcome.    REHAB POTENTIAL: Good   CLINICAL DECISION MAKING: Evolving/moderate complexity   EVALUATION COMPLEXITY: Moderate     GOALS:   SHORT TERM GOALS: Target date: 08/24/22   Pt will be Ind in an initial HEP  Baseline: initiated Status: proper completion Goal status: MET   2.  Pt will voice understanding of measures to assist in pain reduction Baseline: initiated Status: HEP and sleeping positions with support; use of cold pack Goal status: MET   LONG TERM GOALS: Target date: 11/06/22   Pt will be Ind in a final HEP to maintain achieved LOF Baseline: initiated Goal status: ONGOING   2.  Pt's trunk ROM will improve to minimal or less for improved function and as demonstration of decreased pain Baseline: see flow sheets 10/10/22: Lumbar AROM minimally improved and with min pain -see chart Goal status: ONGOING   3.  Pt will report a decreased in pain to 3/10 or less with daily activities Baseline: 6-8/10 Status: 09/11/22= 2/10 10/10/22: 2/10 on average Goal status: MET   4.  Pt L hip will demonstrate ROM and strength equal to that of the R hip for improved function Baseline: pain with all passive movements 10/02/22: PROM equal bilateral with min stretch , left hip weak compared to right Goal status: PARTIALLY MET   5. Improve 5xSTS by MCID of 5" and 2MWT by MCID of 10f as indication of improved functional mobility  Baseline:  TBA STATUS= 09/18/22= 29.2" (low mat, with UE) Goal status: MET for 5xSTS and 2  MWT   6.  Pt's FOTO score will improved to the predicted value of 55% as indication of improved function  Baseline: 38% 10/05/22: 54% Goal status: Improved   7. Pt will report a decrease in L shoulder pain to 2/10 or less for improved QOL   Baseline: 5/10 Goal status: Initial  8. Pt will be able to lift 5# from waist height to above shoulder for functional use of her L UE Baseline: NT Goal status: Initial  PLAN:   PT FREQUENCY: 2x/week   PT DURATION: 6 weeks   PLANNED INTERVENTIONS: Therapeutic exercises, Therapeutic activity, Neuromuscular re-education, Balance training, Gait training, Patient/Family education, Self Care, Joint mobilization, Stair training, Aquatic Therapy, Dry Needling, Electrical stimulation, Spinal manipulation, Spinal mobilization, Cryotherapy, Moist heat, Taping, Traction, Ultrasound, Ionotophoresis '4mg'$ /ml Dexamethasone, Manual therapy, and Re-evaluation.   PLAN FOR NEXT SESSION: ; L hip strength and lumbar flexibility for completion of goals;   Pt has pacemaker, NO IONTO or ESTIM   Adonai Helzer MS, PT 10/23/22 5:03 PM

## 2022-10-24 NOTE — Therapy (Signed)
OUTPATIENT PHYSICAL THERAPY TREATMENT NOTE/L Shoulder Eval   Patient Name: [name] MRN: [id number] DOB:12-31-1950, 72 y.o., female Today's Date: 10/26/2022  PCP: Camillia Herter, NP   REFERRING PROVIDER: Dorna Mai, MD; Clance Boll Gasper Sells, PA-C   See note below for Objective Data and Assessment of Progress/Goals.      END OF SESSION:   PT End of Session - 10/25/22 1551     Visit Number 16    Number of Visits 27    Date for PT Re-Evaluation 12/07/22    Authorization Type MEDICARE PART A AND B    Progress Note Due on Visit 20    PT Start Time 1548    PT Stop Time 1632    PT Time Calculation (min) 44 min    Activity Tolerance Patient tolerated treatment well    Behavior During Therapy WFL for tasks assessed/performed                 Past Medical History:  Diagnosis Date   AICD (automatic cardioverter/defibrillator) present    Anxiety    Arthritis    Back pain    Breast cancer ([place]) 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   Bronchiectasis ([place])    Cerebral aneurysm, nonruptured    had a clip put in   CHF (congestive heart failure) (HCC)    Clostridium difficile infection    Depressive disorder, not elsewhere classified    Diverticulosis of colon (without mention of hemorrhage)    Esophageal reflux    Fatty liver    Fibromyalgia    Gastritis    GERD (gastroesophageal reflux disease)    GI bleed 2004   Glaucoma    Hiatal hernia    History of COVID-19 05/04/2021   Hyperlipidemia    Hypertension    Hypothyroidism    Internal hemorrhoids    Joint pain    Multifocal pneumonia 06/03/2021   Obstructive sleep apnea (adult) (pediatric)    Osteoarthritis    Ostium secundum type atrial septal defect    Other chronic nonalcoholic liver disease    Other pulmonary embolism and infarction    Palpitations    Paroxysmal ventricular tachycardia (HCC)    Personal history of radiation therapy    PONV (postoperative nausea and vomiting)     Presence of permanent cardiac pacemaker    PUD (peptic ulcer disease)    Radiation 02/03/15-03/10/15   Right Breast   Sarcoid    per pt , not sure   Schatzki's ring    Shortness of breath    Sleep apnea    Stroke [facility]) 2013   tia/ pt feels it was around 2008 0r 2009   Takotsubo syndrome    Tubular adenoma of colon    Unspecified transient cerebral ischemia    Unspecified vitamin D deficiency    Past Surgical History:  Procedure Laterality Date   ABI  2006   normal   BREAST EXCISIONAL BIOPSY     BREAST LUMPECTOMY Right 2016   BREAST LUMPECTOMY WITH RADIOACTIVE SEED LOCALIZATION Right 01/03/2015   Procedure: BREAST LUMPECTOMY WITH RADIOACTIVE SEED LOCALIZATION;  Surgeon: Autumn Messing III, MD;  Location: [place];  Service: General;  Laterality: Right;   CARDIAC DEFIBRILLATOR PLACEMENT  2006; 2012   BSX single chamber ICD   carotid dopplers  2006   neg   CEREBRAL ANEURYSM REPAIR  02/1999   COLONOSCOPY     HAMMER TOE SURGERY  11/15/2020   3rd digit bilateral feet  hospitalization  2004   GI bleed, PUD, diverticulosis (EGD,colonscopy)   hospitalization     PE, NSVT, s/p defib   KNEE ARTHROSCOPY     bilateral   LEFT HEART CATHETERIZATION WITH CORONARY ANGIOGRAM N/A 02/22/2012   Procedure: LEFT HEART CATHETERIZATION WITH CORONARY ANGIOGRAM;  Surgeon: Burnell Blanks, MD;  Location: [facility] CATH LAB;  Service: Cardiovascular;  Laterality: N/A;   PARTIAL HYSTERECTOMY     Fibroids   TONGUE BIOPSY  12/12/2017   due to sore tongue and white patches/abnormal cells   TOTAL KNEE ARTHROPLASTY Left 02/13/2016   Procedure: TOTAL KNEE ARTHROPLASTY;  Surgeon: Vickey Huger, MD;  Location: [place];  Service: Orthopedics;  Laterality: Left;   TOTAL KNEE ARTHROPLASTY Right 05/22/2021   Procedure: TOTAL KNEE ARTHROPLASTY;  Surgeon: Vickey Huger, MD;  Location: [facility];  Service: Orthopedics;  Laterality: Right;  with block   UPPER GASTROINTESTINAL ENDOSCOPY     Patient Active Problem List    Diagnosis Date Noted   Partial symptomatic epilepsy with complex partial seizures, not intractable, without status epilepticus ([place]) 08/10/2022   Low back pain 08/10/2022   Gait abnormality 05/04/2022   Muscle spasm 05/04/2022   Upper airway cough syndrome 01/19/2022   Chronic left shoulder pain 11/13/2021   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   Localized swelling of right lower leg 08/29/2021   Tonsillar exudate 08/29/2021   Candida vaginitis 08/28/2021   Chronic pain syndrome 08/25/2021   Multifocal pneumonia 06/03/2021   Pre-operative respiratory examination 05/18/2021   Statin myopathy 04/19/2021   Pain in joint of right shoulder 02/10/2021   Arthritis of both acromioclavicular joints 01/02/2021   Trochanteric bursitis of right hip 10/03/2020   Tailbone injury 05/05/2020   UTI (urinary tract infection) 05/05/2020   Insomnia 05/05/2020   Leg cramps 02/16/2020   Dysuria 02/16/2020   Right hip impingement syndrome 02/01/2020   Right bicipital tenosynovitis 02/01/2020   Bronchiectasis without complication ([place]) 0000000   Myofascial pain 12/21/2019   Estrogen deficiency 12/10/2019   Obesity (BMI 30-39.9) 12/10/2019   Bronchiectasis with (acute) exacerbation ([place]) 10/23/2019   Elevated LFTs 10/15/2019   Lower abdominal pain 09/01/2019   Neck pain 10/25/2017   Paresthesias in left hand 10/25/2017   Paresthesia of foot, bilateral 04/12/2017   Primary osteoarthritis of both hands 09/27/2016   Primary osteoarthritis of both knees 09/27/2016   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   Nasopharyngitis, 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/2016   S/P total knee replacement 02/13/2016   Chronic pain of both knees 12/06/2015   Globus pharyngeus 05/17/2015   Genetic testing 12/23/2014   Family history of breast cancer    Family history of colon cancer    Family history of pancreatic cancer    History of breast cancer 11/23/2014   Allergy to ACE inhibitors 09/27/2014   Prediabetes 06/23/2013   Muscle  spasms of both lower extremities 01/15/2013   Hx of Clostridium difficile infection 10/10/2012   Dyspnea on exertion 04/02/2012   NICM (nonischemic cardiomyopathy) ([place]) 03/07/2012   Post-menopausal 01/11/2012   Atypical chest pain 11/07/2011   Obstructive sleep apnea 04/04/2010   V-tach ([place]) 01/03/2009   ICD  [employer]  Single chamber 01/03/2009   PFO (patent foramen ovale) 09/29/2008   Vitamin D deficiency 08/17/2008   Hypothyroidism 11/18/2006   Hyperlipidemia 11/18/2006   Depression with anxiety 11/18/2006   Essential hypertension 11/18/2006   Mitral valve prolapse 11/18/2006   Cerebral aneurysm 11/18/2006   Allergic rhinitis 11/18/2006   GERD 11/18/2006   Diverticulosis of colon 11/18/2006  Fatty liver 11/18/2006   Fibromyalgia 11/18/2006    REFERRING DIAG: M54.50 (ICD-10-CM) - Left-sided low back pain without sciatica, unspecified chronicity; M25.552 (ICD-10-CM) - Left hip pain; Pain in left shoulder; Presence of right artificial knee joint.  THERAPY DIAG:  Chronic left shoulder pain  Chronic pain of right knee  Pain in left hip  Muscle weakness (generalized)  Chronic bilateral low back pain without sciatica  Rationale for Evaluation and Treatment Rehabilitation  PERTINENT HISTORY:  Automatic cardioverter/defibrillator; Bilat knee replacements; arthritis; fibromyalgia; depression, High BMI  PRECAUTIONS: Pacemaker  SUBJECTIVE:                                                                                                                                                                                      SUBJECTIVE STATEMENT:  My L low back and hip are bothering more today and I'm not sure why. I woke up in more pain.  PAIN:  Are you having pain? Yes: NPRS scale: 6/10 for L hip; 6/10 for low back Pain location: L lowback, gluteal, lat hip, groin, and ant thigh Pain description: ache, sharp occasionally, constant Aggravating factors: Walking,  sleeping, in/out of car, bending over Relieving factors: Muscle relaxors, heat pad, tylenol On Eval pain range=6-8/10  PAIN:  Are you having pain? Yes: NPRS scale: 2/10 Pain location: Superior GH jt and upper trap Pain description: ache, constant Aggravating factors: Arm movement c dressing and reaching up Relieving factors: Resting  OBJECTIVE: (objective measures completed at initial evaluation unless otherwise dated)  Pt reports xrays were taken with [facility] yesterday. He was able to recall there was no fracture, proper spacing for the joint   PATIENT SURVEYS:  FOTO: Perceived function   38%, predicted   55%  09/04/22: 54%  10/02/22: 54%   SCREENING FOR RED FLAGS: Bowel or bladder incontinence: No Spinal tumors: No Compression fracture: No   COGNITION: Overall cognitive status: Within functional limits for tasks assessed                          SENSATION: WFL   MUSCLE LENGTH: Hamstrings: Right NT deg; Left NT deg Marcello Moores test: Right NT deg; Left NT deg   POSTURE: increased lumbar lordosis   PALPATION: TTP with increased muscle tightness to the L gluteal region   LUMBAR ROM:    AROM eval 10/10/22  Flexion Mod limited, low back pulling pain Reaches min shins, low back pain  Extension Mod limited, pain low back and L hip Min limited, pain on low back  Right lateral flexion Mod limited, pulling pain R hip Min limited , pain in left hip  Left  lateral flexion Mod limited, pain across low back Min limited, min low back pain   Right rotation Mod limited, pulling pain R hip and across low back WFL min pain  Left rotation Mod limited, pain across low back WFL , min low back    (Blank rows = not tested)   LOWER EXTREMITY ROM:     L decreased in comparison to R  due to pain Passive  Right eval Left eval Left 10/02/22  Hip flexion   P WFL  Hip extension   P   Hip abduction   P WFL  Hip adduction       Hip internal rotation   P WFL  Hip external rotation   P WFL  Knee  flexion       Knee extension       Ankle dorsiflexion       Ankle plantarflexion       Ankle inversion       Ankle eversion       P=pain  (Blank rows = not tested)   LOWER EXTREMITY MMT:   L LE strength is deceased in comparison to R due to Pain MMT Right eval Left eval Right 10/02/22 Left 10/02/22  Hip flexion   P 4+ 4-  Hip extension   P    Hip abduction   P 4- 4  Hip adduction   P    Hip internal rotation   P    Hip external rotation   P    Knee flexion        Knee extension        Ankle dorsiflexion        Ankle plantarflexion        Ankle inversion        Ankle eversion        P=pain  (Blank rows = not tested)   LUMBAR SPECIAL TESTS:  Straight leg raise test: Negative, Slump test: Negative, and SI Compression/distraction test: Negative          FADER L hip positive   FUNCTIONAL TESTS:  5 times sit to stand: 08/17/22: 40.8 sec 09/18/22= 29.2" (low mat, with UE): 10/02/22: 23.3 sec without UE from standard mat 2 minute walk test: 08/17/22: 185 Feet : 10/02/22: 308 feet    GAIT: Distance walked: 171f Assistive device utilized: Single point cane Level of assistance: Complete [place] Comments: decreased pace  [place] Assessmnet:  SENSATION: WFL  POSTURE: Forward heads, rounded shoulders  UPPER EXTREMITY ROM:   Active ROM Right 10/26/2022 Left 10/26/2022  Shoulder flexion A120 pain, P130 pain 120  Shoulder extension    Shoulder abduction    Shoulder adduction    Shoulder internal rotation T7 pain T8  Shoulder external rotation T2 pain T3  Elbow flexion    Elbow extension    Wrist flexion    Wrist extension    Wrist ulnar deviation    Wrist radial deviation    Wrist pronation    Wrist supination    (Blank rows = not tested) C elevation pain increases at 80d and increases UPPER EXTREMITY MMT:  MMT Right 10/26/2022 Left 10/26/2022  Shoulder flexion 120 pain 120  Shoulder extension    Shoulder abduction    Shoulder adduction    Shoulder internal  rotation    Shoulder external rotation T2 T3  Middle trapezius    Lower trapezius    Elbow flexion    Elbow extension    Wrist flexion    Wrist  extension    Wrist ulnar deviation    Wrist radial deviation    Wrist pronation    Wrist supination    Grip strength (lbs)    (Blank rows = not tested)  SHOULDER SPECIAL TESTS:  Impingement tests: Hawkins/Kennedy impingement test: positive   Instability tests:  Negative  Rotator cuff assessment: Empty can test: negative and Full can test: negative  Both negative for overt weakness, min provocation of pain.  Biceps assessment: Yergason's test: negative   No overt weakness JOINT MOBILITY TESTING:  WNLs  PALPATION:  TTP for the superior Porter area and upper trap    TODAY'S TREATMENT: [facility] Adult PT Treatment:                                                DATE: 10/25/22 Therapeutic Exercise: LTR x10 10 Supine Piriformis stretch x2 20" Seated neural glide x10 each Seated hamstring stretch x2 20" each Seated ball press for abs 2x10, vc for breathing STS from min elevated bari-mat 2x5 UE/back ext RTB 2x10 Manual Therapy: STM/DTM to the L lumbar paraspinals and QL and to the L gluteal and piriformis muscles Skilled palpation to identify TrPs and taut muscle bands  Trigger Point Dry Needling Treatment: Pre-treatment instruction: Patient instructed on dry needling rationale, procedures, and possible side effects including pain during treatment (achy,cramping feeling), bruising, drop of blood, lightheadedness, nausea, sweating. Patient Consent Given: Yes Education handout provided: Yes Muscles treated: L lower lumbar paraspinals and multifidi; piriformis, glut medius, glut minimus Needle size and number: .30x38m x 1 and .30x101mx 1 Electrical stimulation performed: No Parameters: N/A Treatment response/outcome: Twitch response elicited Post-treatment instructions: Patient instructed to expect possible mild to moderate muscle soreness  later today and/or tomorrow. Patient instructed in methods to reduce muscle soreness and to continue prescribed HEP. If patient was dry needled over the lung field, patient was instructed on signs and symptoms of pneumothorax and, however unlikely, to see immediate medical attention should they occur. Patient was also educated on signs and symptoms of infection and to seek medical attention should they occur. Patient verbalized understanding of these instructions and education.   [facility]                                                DATE: 10/23[facility]22 Manual Therapy: STM/DTM to the upper trap  Modalities: MH to the L shoulder x10 mins for pain relief  [facility] Adult PT Treatment:                                                DATE: 10/16/22 Therapeutic Exercise: Nustep L5 UE/LE x 5 minutes  Seated trunk flexion forward and laterally Seated neural glide x10 each Seated hamstring stretch x2 20" each Seated ball press for abs 2x10, vc for breathing STS from min elevated bari-mat 2x5 Paloff press RTB x10 each UE/back ext RTB 2x10  [facility] Adult PT Treatment:  DATE: 10/10/22 Therapeutic Exercise: Nustep L5 UE/Le x 5 minutes  Lumbar AROM Seated Lumbar stretches: side bend , rotation, lumbar flexion, hamstring stretches: options reviewed to perform while visiting son in hospital.  [place],  [place] -min rom  [facility] Adult PT Treatment:                                                DATE: 10/02/22 Therapeutic Exercise: Seated blue band clam Seated march blue band  Hip abduction x 10 each   Therapeutic Activity: 5 x STS 2 MWT MMT FOTO  PATIENT EDUCATION:  Education details: Eval findings, POC, HEP, self care Person educated: Patient Education method: Explanation, Demonstration, Tactile cues, Verbal cues, and Handouts Education comprehension: verbalized understanding, returned demonstration, verbal cues required, and tactile cues  required   HOME EXERCISE PROGRAM: Access Code: [email or web address] URL: [email or web address] Date: 09/25/2022 Prepared by: Gar Ponto  Exercises - Supine Lower Trunk Rotation  - 1-2 x daily - 7 x weekly - 1 sets - 5 reps - 5 hold - Supine Piriformis Stretch with Foot on Ground  - 1-2 x daily - 7 x weekly - 3 reps - 15 hold - Hooklying Single Knee to Chest  - 1-2 x daily - 7 x weekly - 1 sets - 3 reps - 15 hold - Supine Bridge  - 1-2 x daily - 7 x weekly - 1 sets - 10 reps - 3 hold - Hooklying Clamshell with Resistance  - 1 x daily - 7 x weekly - 1 sets - 10 reps - 3 hold - Supine Hip Adduction Isometric with Ball  - 1 x daily - 7 x weekly - 1 sets - 10 reps - 3 hold - Seated Flexion Stretch with Swiss Ball  - 1 x daily - 7 x weekly - 1 sets - 6 reps - 20 hold - Seated Abdominal Press into [employer]  - 1 x daily - 7 x weekly - 1 sets - 10 reps - 3 hold - Standing Hip Flexor Stretch  - 1 x daily - 7 x weekly - 1 sets - 10 reps - 20 hold - Supine Posterior Pelvic Tilt  - 1 x daily - 7 x weekly - 1 sets - 10 reps - 3 hold Added 10/02/22:  - Seated March with Resistance  - 1 x daily - 7 x weekly - 3 sets - 10 reps - seated clam with resistance band  - 1 x daily - 7 x weekly - 3 sets - 10 reps   ASSESSMENT:   CLINICAL IMPRESSION: Pt reports a greater issue with L low back and hip pain today, so PT was provided for manual therapy as above f/b TPDN to the L lower lumbar paraspinals and multifidi; piriformis, glut medius, glut minimus. Muscle twitch responses were elicited. Lumbopelvic flexibility and strengthening therex were then completed for muscle activation. Pt tolerated PT today without adverse effects. Will assess pt's full response to TPDN the next PT session. Pt will continue to benefit from skilled PT to address impairments for improved function of the low back, L shoulder and R knee with less pain.  OBJECTIVE IMPAIRMENTS: decreased activity tolerance, decreased balance, diffculity  walking, decreased ROM, decreased strength, increased muscle spasms, obesity, and pain.    ACTIVITY LIMITATIONS: carrying, lifting, bending, sitting, standing, squatting, sleeping, stairs, and locomotion level  PARTICIPATION LIMITATIONS: meal prep, cleaning, laundry, shopping, and community activity   PERSONAL FACTORS: Fitness, Past/current experiences, Time since onset of injury/illness/exacerbation, and 3+ comorbidities:    Mod limited, pulling pain R hipare also affecting patient's functional outcome.    REHAB POTENTIAL: Good   CLINICAL DECISION MAKING: Evolving/moderate complexity   EVALUATION COMPLEXITY: Moderate     GOALS:   SHORT TERM GOALS: Target date: 08/24/22   Pt will be Ind in an initial HEP  Baseline: initiated Status: proper completion Goal status: MET   2.  Pt will voice understanding of measures to assist in pain reduction Baseline: initiated Status: HEP and sleeping positions with support; use of cold pack Goal status: MET   LONG TERM GOALS: Target date: 11/06/22   Pt will be Ind in a final HEP to maintain achieved LOF Baseline: initiated Goal status: ONGOING   2.  Pt's trunk ROM will improve to minimal or less for improved function and as demonstration of decreased pain Baseline: see flow sheets 10/10/22: Lumbar AROM minimally improved and with min pain -see chart Goal status: ONGOING   3.  Pt will report a decreased in pain to 3/10 or less with daily activities Baseline: 6-8/10 Status: 09/11/22= 2/10 10/10/22: 2/10 on average Goal status: MET   4.  Pt L hip will demonstrate ROM and strength equal to that of the R hip for improved function Baseline: pain with all passive movements 10/02/22: PROM equal bilateral with min stretch , left hip weak compared to right Goal status: PARTIALLY MET   5. Improve 5xSTS by MCID of 5" and 2MWT by MCID of 43f as indication of improved functional mobility  Baseline: TBA STATUS= 09/18/22= 29.2" (low mat, with UE) Goal  status: MET for 5xSTS and 2 MWT   6.  Pt's FOTO score will improved to the predicted value of 55% as indication of improved function  Baseline: 38% 10/05/22: 54% Goal status: Improved   7. Pt will report a decrease in L shoulder pain to 2/10 or less for improved QOL   Baseline: 5/10 Goal status: Initial  8. Pt will be able to lift 5# from waist height to above shoulder for functional use of her L UE Baseline: NT Goal status: Initial  PLAN:   PT FREQUENCY: 2x/week   PT DURATION: 6 weeks   PLANNED INTERVENTIONS: Therapeutic exercises, Therapeutic activity, Neuromuscular re-education, Balance training, Gait training, Patient/Family education, Self Care, Joint mobilization, Stair training, Aquatic Therapy, Dry Needling, Electrical stimulation, Spinal manipulation, Spinal mobilization, Cryotherapy, Moist heat, Taping, Traction, Ultrasound, Ionotophoresis '4mg'$ /ml Dexamethasone, Manual therapy, and Re-evaluation.   PLAN FOR NEXT SESSION: ; L hip strength and lumbar flexibility for completion of goals;   Pt has pacemaker, NO IONTO or ESTIM   Clementina Mareno MS, PT 10/26/22 6:28 AM

## 2022-10-25 ENCOUNTER — Ambulatory Visit: Payer: Medicare Other

## 2022-10-25 DIAGNOSIS — M25552 Pain in left hip: Secondary | ICD-10-CM

## 2022-10-25 DIAGNOSIS — M25512 Pain in left shoulder: Secondary | ICD-10-CM | POA: Diagnosis not present

## 2022-10-25 DIAGNOSIS — M545 Low back pain, unspecified: Secondary | ICD-10-CM

## 2022-10-25 DIAGNOSIS — M6281 Muscle weakness (generalized): Secondary | ICD-10-CM

## 2022-10-25 DIAGNOSIS — G8929 Other chronic pain: Secondary | ICD-10-CM

## 2022-10-28 NOTE — Progress Notes (Unsigned)
Cardiology Office Note Date:  10/28/2022  Patient ID:  [name], [name] 1950-12-01, MRN [id number] PCP:  Dorna Mai, MD  Cardiologist:  Dr. Irish Lack Electrophysiologist: Dr. Caryl Comes Pulmonology: Dr. Halford Chessman     Chief Complaint: *** annual device/EP visit  History of Present Illness: [name] is a 72 y.o. female with history of NICM, VT,  There is ? Of cardiac sarcoidosis by Dr. Olin Pia note,  ICD, non-obs CAD by cath 2006, ophthalmic artery aneurysm repair, HTN, morbid obesity, OSA w/CPAP, bronchiectasis (Dr. Halford Chessman), fibromyalgia, OA, HTN, HLD, hypothyroidism, anxiety, GERD  Lengthy PMHx/problem list is reviewed  I saw her may 2022 She continues to do well occ palpitations, no changes in behavior. No CP, breathing is at her baseline She continues to follow with pulmonary No active cough/unusual SOB. No near syncope or syncope. She will sometimes feel a little dizzy when she looks up, this is a sense of movement/off balance, not lightheaded or near syncopal. Device functioning well No changes were made  Following with EP, cards and pharmacy teams a few times since then.  She saw Dr. Caryl Comes 11/20/21, reported hair loss and BB changed to [place] Most recently saw Elwyn Reach, NP 10/12/22, her son in the process of heart transplant evaluation at [facility], this of course stressful, but otherwise seemed to be doing well, BP was high felt to be white coat, no changes made, the change to dilt seemed to be helpful, and planned to f/u with EP for annual visit  *** symptoms *** VT?, rhythm *** + remotes   Device information BSCi single chamber ICD implanted 12/22/2009 Secondary prevention  Past Medical History:  Diagnosis Date   AICD (automatic cardioverter/defibrillator) present    Anxiety    Arthritis    Back pain    Breast cancer ([place]) 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   Bronchiectasis ([place])    Cerebral aneurysm, nonruptured    had a clip put in   CHF  (congestive heart failure) ([place])    Clostridium difficile infection    Depressive disorder, not elsewhere classified    Diverticulosis of colon (without mention of hemorrhage)    Esophageal reflux    Fatty liver    Fibromyalgia    Gastritis    GERD (gastroesophageal reflux disease)    GI bleed 2004   Glaucoma    Hiatal hernia    History of COVID-19 05/04/2021   Hyperlipidemia    Hypertension    Hypothyroidism    Internal hemorrhoids    Joint pain    Multifocal pneumonia 06/03/2021   Obstructive sleep apnea (adult) (pediatric)    Osteoarthritis    Ostium secundum type atrial septal defect    Other chronic nonalcoholic liver disease    Other pulmonary embolism and infarction    Palpitations    Paroxysmal ventricular tachycardia ([place])    Personal history of radiation therapy    PONV (postoperative nausea and vomiting)    Presence of permanent cardiac pacemaker    PUD (peptic ulcer disease)    Radiation 02/03/15-03/10/15   Right Breast   Sarcoid    per pt , not sure   Schatzki's ring    Shortness of breath    Sleep apnea    Stroke [facility]) 2013   tia/ pt feels it was around 2008 0r 2009   Takotsubo syndrome    Tubular adenoma of colon    Unspecified transient cerebral ischemia    Unspecified vitamin D deficiency  Past Surgical History:  Procedure Laterality Date   ABI  2006   normal   BREAST EXCISIONAL BIOPSY     BREAST LUMPECTOMY Right 2016   BREAST LUMPECTOMY WITH RADIOACTIVE SEED LOCALIZATION Right 01/03/2015   Procedure: BREAST LUMPECTOMY WITH RADIOACTIVE SEED LOCALIZATION;  Surgeon: Autumn Messing III, MD;  Location: [place];  Service: General;  Laterality: Right;   CARDIAC DEFIBRILLATOR PLACEMENT  2006; 2012   BSX single chamber ICD   carotid dopplers  2006   neg   CEREBRAL ANEURYSM REPAIR  02/1999   COLONOSCOPY     HAMMER TOE SURGERY  11/15/2020   3rd digit bilateral feet    hospitalization  2004   GI bleed, PUD, diverticulosis (EGD,colonscopy)    hospitalization     PE, NSVT, s/p defib   KNEE ARTHROSCOPY     bilateral   LEFT HEART CATHETERIZATION WITH CORONARY ANGIOGRAM N/A 02/22/2012   Procedure: LEFT HEART CATHETERIZATION WITH CORONARY ANGIOGRAM;  Surgeon: Burnell Blanks, MD;  Location: [facility] CATH LAB;  Service: Cardiovascular;  Laterality: N/A;   PARTIAL HYSTERECTOMY     Fibroids   TONGUE BIOPSY  12/12/2017   due to sore tongue and white patches/abnormal cells   TOTAL KNEE ARTHROPLASTY Left 02/13/2016   Procedure: TOTAL KNEE ARTHROPLASTY;  Surgeon: Vickey Huger, MD;  Location: [place];  Service: Orthopedics;  Laterality: Left;   TOTAL KNEE ARTHROPLASTY Right 05/22/2021   Procedure: TOTAL KNEE ARTHROPLASTY;  Surgeon: Vickey Huger, MD;  Location: [facility];  Service: Orthopedics;  Laterality: Right;  with block   UPPER GASTROINTESTINAL ENDOSCOPY      Current Outpatient Medications  Medication Sig Dispense Refill   acetaminophen (TYLENOL) 500 MG tablet Take 500 mg by mouth every 6 (six) hours as needed for moderate pain.     acetaminophen-codeine (TYLENOL/CODEINE #3) 300-30 MG tablet Take 1 tablet by mouth every 8 (eight) hours as needed for moderate pain. 90 tablet 3   albuterol (PROVENTIL) (2.5 MG/3ML) 0.083% nebulizer solution USE 1 VIAL IN NEBULIZER EVERY 6 HOURS (Patient taking differently: Take 2.5 mg by nebulization every 6 (six) hours as needed for shortness of breath.) 120 mL 11   albuterol (VENTOLIN HFA) 108 (90 Base) MCG/ACT inhaler Inhale 2 puffs into the lungs every 6 (six) hours as needed for wheezing or shortness of breath. 18 g 3   Ascorbic Acid (VITAMIN C) 1000 MG tablet Take 1,000 mg by mouth daily.     aspirin EC 81 MG tablet Take 81 mg by mouth daily. Swallow whole.     azelastine (ASTELIN) 0.1 % nasal spray Place 1 spray into both nostrils 2 (two) times daily. Use in each nostril as directed 90 mL 3   budesonide-formoterol (SYMBICORT) 80-4.5 MCG/ACT inhaler Inhale 1 puff into the lungs 2 (two) times daily. 1 each  12   cefUROXime (CEFTIN) 500 MG tablet Take 1 tablet (500 mg total) by mouth 2 (two) times daily with a meal. 14 tablet 0   cetirizine (ZYRTEC) 10 MG tablet Take 10 mg by mouth daily as needed for allergies.     Cholecalciferol (DIALYVITE VITAMIN D 5000) 125 MCG (5000 UT) capsule Take 5,000 Units by mouth daily.     cloBAZam (ONFI) 10 MG tablet Take 1 tablet (10 mg total) by mouth 2 (two) times daily. 60 tablet 5   diltiazem (CARDIZEM CD) 120 MG 24 hr capsule Take 1 capsule (120 mg total) by mouth daily. Do Not start this medication until after you wean off Carvedilol. 90 capsule  2   Empagliflozin (JARDIANCE PO) Take by mouth.     Evolocumab (REPATHA SURECLICK) [id number] MG/ML SOAJ Inject 140 mg into the skin every 14 (fourteen) days. 6 mL 3   famotidine (PEPCID) 40 MG tablet Take 1 tablet (40 mg total) by mouth at bedtime. 360 tablet 6   fexofenadine (ALLEGRA) 180 MG tablet Take 180 mg by mouth daily as needed for allergies (alternates with zyrtec sometimes).     fluticasone (CUTIVATE) 0.05 % cream Apply topically 2 (two) times daily. 60 g 2   fluticasone (FLONASE) 50 MCG/ACT nasal spray Place 2 sprays into both nostrils daily as needed for allergies or rhinitis. 48 g 3   FOLIC ACID PO Take 1 tablet by mouth daily.     furosemide (LASIX) 20 MG tablet TAKE 2 TABLETS (40 MG) 3 DAYS PER WEEK AND TAKE 1 TABLET (20 MG) THE OTHER DAYS OF THE WEEK 130 tablet 1   gabapentin (NEURONTIN) 100 MG capsule Take 1 capsule (100 mg total) by mouth 3 (three) times daily. 90 capsule 6   gabapentin (NEURONTIN) 300 MG capsule Take 1 capsule (300 mg total) by mouth at bedtime. 30 capsule 2   hydroxypropyl methylcellulose / hypromellose (ISOPTO TEARS / GONIOVISC) 2.5 % ophthalmic solution Place 1 drop into both eyes at bedtime.     JARDIANCE 10 MG TABS tablet Take 10 mg by mouth daily.     latanoprost (XALATAN) 0.005 % ophthalmic solution Place 1 drop into both eyes at bedtime.     levETIRAcetam (KEPPRA) 500 MG tablet Take 1  tablet (500 mg total) by mouth 2 (two) times daily. 180 tablet 1   levothyroxine (SYNTHROID) 50 MCG tablet TAKE 1 TABLET EVERY DAY 90 tablet 0   magnesium oxide (MAG-OX) 400 MG tablet Take 1 tablet (400 mg total) by mouth 2 (two) times daily. 180 tablet 3   mometasone (ELOCON) 0.1 % lotion Apply 1 application topically daily as needed (rash). 60 mL 1   montelukast (SINGULAIR) 10 MG tablet TAKE 1 TABLET AT BEDTIME 90 tablet 3   Multiple Vitamins-Minerals (MULTIVITAMIN PO) Take 1 tablet by mouth daily.     nitroGLYCERIN (NITROSTAT) 0.4 MG SL tablet DISSOLVE ONE TABLET UNDER THE TONGUE EVERY 5 MINUTES AS NEEDED FOR CHEST PAIN.  DO NOT EXCEED A TOTAL OF 3 DOSES IN 15 MINUTES (Patient taking differently: Place 0.4 mg under the tongue every 5 (five) minutes as needed for chest pain.) 25 tablet 0   nystatin (MYCOSTATIN) 100000 UNIT/ML suspension Take 5 mLs (500,000 Units total) by mouth 4 (four) times daily. 60 mL 0   pantoprazole (PROTONIX) 40 MG tablet Take 1 tablet (40 mg total) by mouth daily. 90 tablet 3   polyethylene glycol powder (GLYCOLAX/MIRALAX) powder Take 17 grams by mouth daily 1080 g 0   potassium chloride (KLOR-CON M) 10 MEQ tablet TAKE 1/2 TABLET EVERY DAY (CALL OFFICE TO SCHEDULE YEARLY APPT) 45 tablet 3   Respiratory Therapy Supplies (FLUTTER) DEVI 1 Device by Does not apply route as needed. 1 each 0   sodium chloride HYPERTONIC 3 % nebulizer solution Take by nebulization 2 (two) times daily as needed for cough. 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 750 mL 12   tiZANidine (ZANAFLEX) 4 MG tablet Take 0.5-1 tablets (2-4 mg total) by mouth every 8 (eight) hours as needed for muscle spasms. 90 tablet 5   traMADol (ULTRAM) 50 MG tablet Take 1 tablet (50 mg total) by mouth every 12 (twelve) hours as needed. (Patient not taking: Reported on 10/12/2022)  180 tablet 1   traZODone (DESYREL) 100 MG tablet TAKE 1 TO 1 AND 1/2 TABLETS AT BEDTIME 135 tablet 1   triamcinolone cream (KENALOG) 0.1 % Apply 1 Application  topically 2 (two) times daily. 453.6 g 0   Current Facility-Administered Medications  Medication Dose Route Frequency Provider Last Rate Last Admin   lidocaine (XYLOCAINE) 1 % (with pres) injection 1 mL  1 mL Other Once Lovorn, Megan, MD       lidocaine (XYLOCAINE) 1 % (with pres) injection 3 mL  3 mL Other Once Lovorn, Megan, MD       triamcinolone acetonide (KENALOG-40) injection 40 mg  40 mg Intramuscular Once Lovorn, Megan, MD        Allergies:   Ace inhibitors, Amitriptyline hcl, Atorvastatin, Dilantin [phenytoin sodium extended], Duloxetine hcl, Paroxetine, Ramipril, Rosuvastatin, Carbamazepine, Codeine, Paxlovid [nirmatrelvir-ritonavir], Phenytoin sodium extended, Simvastatin, and Wellbutrin [bupropion]   Social History:  The patient  reports that she quit smoking about 33 years ago. Her smoking use included cigarettes. She has a 15.00 pack-year smoking history. She has been exposed to tobacco smoke. She has never used smokeless tobacco. She reports current alcohol use of about 3.0 standard drinks of alcohol per week. She reports that she does not currently use drugs. Frequency: 1.00 time per week.   Family History:  The patient's family history includes Allergies in her sister; Alzheimer's disease in her mother; Breast cancer in her cousin and maternal aunt; Breast cancer (age of onset: 63) in her maternal aunt; Cancer (age of onset: 60) in her maternal grandmother; Colon cancer (age of onset: 56) in her maternal aunt; Diabetes in her mother; Heart disease in her brother, father, sister, and son; Hypertension in her mother; Lung cancer in her maternal uncle; Lung cancer (age of onset: 48) in her maternal grandfather; Obesity in her mother; Other in her brother; Pancreatic cancer (age of onset: 99) in her cousin; Prostate cancer (age of onset: 59) in her brother; Seizures in her sister; Throat cancer in her brother.  ROS:  Please see the history of present illness.    All other systems are  reviewed and otherwise negative.   PHYSICAL EXAM:  VS:  LMP  (LMP Unknown)  BMI: There is no height or weight on file to calculate BMI. Well nourished, well developed, in no acute distress HEENT: normocephalic, atraumatic Neck: no JVD, carotid bruits or masses Cardiac:  *** RRR; no significant murmurs, no rubs, or gallops Lungs:  *** CTA b/l, no wheezing, rhonchi or rales Abd: soft, nontender, obese MS: no deformity or atrophy Ext: *** no edema Skin: warm and dry, no rash Neuro:  No gross deficits appreciated Psych: euthymic mood, full affect  *** ICD site is stable, no tethering or discomfort   EKG: not done today  Device interrogation done today and reviewed by myself:  ***  08/11/2021: TTE  1. Left ventricular ejection fraction, by estimation, is 50 to 55%. The  left ventricle has low normal function. The left ventricle has no regional  wall motion abnormalities. There is mild concentric left ventricular  hypertrophy. Left ventricular  diastolic parameters are indeterminate.   2. Right ventricular systolic function is normal. The right ventricular  size is normal. There is mildly elevated pulmonary artery systolic  pressure. The estimated right ventricular systolic pressure is [id number] mmHg.   3. Left atrial size was mildly dilated.   4. Right atrial size was mildly dilated.   5. The mitral valve is grossly normal. Trivial mitral  valve  regurgitation. No evidence of mitral stenosis.   6. The aortic valve is tricuspid. Aortic valve regurgitation is not  visualized. No aortic stenosis is present.   7. The inferior vena cava is normal in size with greater than 50%  respiratory variability, suggesting right atrial pressure of 3 mmHg.   8. Intra atrial shunt cannot be excluded.   Comparison(s): No significant change from prior study. Prior images  reviewed side by side. Visually EF is similar to prior.    04/13/2020: stress myoview The left ventricular ejection fraction is  mildly decreased (45-54%). Nuclear stress EF: 50%. There was no ST segment deviation noted during stress. The perfusion study is normal. This is a low risk study.    05/13/2017; TTE Study Conclusions  - Left ventricle: The cavity size was normal. Wall thickness was    increased in a pattern of mild LVH. Systolic function was normal.    The estimated ejection fraction was in the range of 55% to 60%.    Wall motion was normal; there were no regional wall motion    abnormalities. Doppler parameters are consistent with abnormal    left ventricular relaxation (grade 1 diastolic dysfunction).  - Mitral valve: Mildly thickened leaflets . Valve area by pressure    half-time: 2.32 cm^2.  - Left atrium: Volume/bsa, ES (1-plane Simpson&'s, A4C): 33.2    ml/m^2.  - Right ventricle: Pacer wire or catheter noted in right ventricle.  - Right atrium: The atrium was mildly dilated.  - Tricuspid valve: There was mild regurgitation.   Impressions:  - Compared to the prior study, there has been no significant    interval change.     Recent Labs: 08/24/2022: ALT 41; BUN 16; Creatinine, Ser 0.80; Hemoglobin 14.2; NT-Pro BNP 76; Platelets 208.0; Potassium 4.4; Sodium 141 09/12/2022: TSH 3.100  09/07/2022: Chol/HDL Ratio 3.0; Cholesterol, Total 252; HDL 84; LDL Chol Calc (NIH) 155; Triglycerides 77   CrCl cannot be calculated (Patient's most recent lab result is older than the maximum 21 days allowed.).   Wt Readings from Last 3 Encounters:  10/12/22 214 lb 3.2 oz (97.2 kg)  09/19/22 215 lb 4 oz (97.6 kg)  09/12/22 216 lb (98 kg)     Other studies reviewed: Additional studies/records reviewed today include: summarized above  ASSESSMENT AND PLAN:  1. ICD      *** Intact function     *** no programming changes amde  2. VT, PVCs     ***      *** These are rare  3. NICM     Recovered LVEF by last echo and stress testing     *** No exam findings or symptoms of volume OL     C/w Dr.  Irish Lack  4. HTN     *** Looks good     *** No changes  Disposition: ***  Current medicines are reviewed at length with the patient today.  The patient did not have any concerns regarding medicines.  Venetia Night, PA-C 10/28/2022 8:41 AM     [address] [place] [address] [phone] (office)  [phone] (fax)

## 2022-10-29 ENCOUNTER — Ambulatory Visit: Payer: Medicare Other | Attending: Physician Assistant | Admitting: Physician Assistant

## 2022-10-29 ENCOUNTER — Encounter: Payer: Self-pay | Admitting: Physician Assistant

## 2022-10-29 VITALS — BP 138/84 | HR 94 | Ht 66.5 in | Wt 218.0 lb

## 2022-10-29 DIAGNOSIS — I428 Other cardiomyopathies: Secondary | ICD-10-CM

## 2022-10-29 DIAGNOSIS — I10 Essential (primary) hypertension: Secondary | ICD-10-CM

## 2022-10-29 DIAGNOSIS — I472 Ventricular tachycardia, unspecified: Secondary | ICD-10-CM | POA: Diagnosis present

## 2022-10-29 DIAGNOSIS — Z95810 Presence of automatic (implantable) cardiac defibrillator: Secondary | ICD-10-CM

## 2022-10-29 LAB — CUP PACEART INCLINIC DEVICE CHECK
Date Time Interrogation Session: 20240311130854
HighPow Impedance: 38 Ohm
HighPow Impedance: 77 Ohm
Implantable Lead Connection Status: 753985
Implantable Lead Implant Date: 20050603
Implantable Lead Location: 753860
Implantable Lead Model: 185
Implantable Lead Serial Number: 116340
Implantable Pulse Generator Implant Date: 20110505
Lead Channel Impedance Value: 637 Ohm
Lead Channel Pacing Threshold Amplitude: 0.8 V
Lead Channel Pacing Threshold Pulse Width: 0.4 ms
Lead Channel Sensing Intrinsic Amplitude: 11.9 mV
Lead Channel Setting Pacing Amplitude: 2.4 V
Lead Channel Setting Pacing Pulse Width: 0.4 ms
Lead Channel Setting Sensing Sensitivity: 0.4 mV
Pulse Gen Serial Number: 266301
Zone Setting Status: 755011

## 2022-10-29 LAB — LIPID PANEL
Chol/HDL Ratio: 3.9 ratio (ref 0.0–4.4)
Cholesterol, Total: 244 mg/dL — ABNORMAL HIGH (ref 100–199)
HDL: 63 mg/dL (ref >39)
LDL Chol Calc (NIH): 162 mg/dL — ABNORMAL HIGH (ref 0–99)
Triglycerides: 110 mg/dL (ref 0–149)
VLDL Cholesterol Cal: 19 mg/dL (ref 5–40)

## 2022-10-29 NOTE — Patient Instructions (Signed)
Medication Instructions:   Your physician recommends that you continue on your current medications as directed. Please refer to the Current Medication list given to you today.  *If you need a refill on your cardiac medications before your next appointment, please call your pharmacy*   Lab Work: [place]   If you have labs (blood work) drawn today and your tests are completely normal, you will receive your results only by: [place] (if you have MyChart) OR A paper copy in the mail If you have any lab test that is abnormal or we need to change your treatment, we will call you to review the results.   Testing/Procedures: NONE ORDERED  TODAY     Follow-Up: At [facility], you and your health needs are our priority.  As part of our continuing mission to provide you with exceptional heart care, we have created designated Provider Care Teams.  These Care Teams include your primary Cardiologist (physician) and Advanced Practice Providers (APPs -  Physician Assistants and Nurse Practitioners) who all work together to provide you with the care you need, when you need it.  We recommend signing up for the patient portal called "MyChart".  Sign up information is provided on this After Visit Summary.  MyChart is used to connect with patients for Virtual Visits (Telemedicine).  Patients are able to view lab/test results, encounter notes, upcoming appointments, etc.  Non-urgent messages can be sent to your provider as well.   To learn more about what you can do with MyChart, go to [email or web address].    Your next appointment:   1 year(s)  Provider:   You may see Virl Axe, MD or one of the following Advanced Practice Providers on your designated Care Team:     Other Instructions

## 2022-10-30 ENCOUNTER — Other Ambulatory Visit: Payer: Self-pay | Admitting: Pharmacist

## 2022-10-30 DIAGNOSIS — E785 Hyperlipidemia, unspecified: Secondary | ICD-10-CM

## 2022-10-31 ENCOUNTER — Ambulatory Visit (INDEPENDENT_AMBULATORY_CARE_PROVIDER_SITE_OTHER): Payer: Medicare Other | Admitting: Podiatry

## 2022-10-31 ENCOUNTER — Encounter: Payer: Self-pay | Admitting: Podiatry

## 2022-10-31 DIAGNOSIS — L84 Corns and callosities: Secondary | ICD-10-CM | POA: Diagnosis not present

## 2022-10-31 NOTE — Progress Notes (Signed)
Subjective:   Patient ID: [name], female   DOB: 72 y.o.   MRN: [id number]   HPI Patient presents with severe lesions of both feet and also is waiting for her son to get heart transplant   ROS      Objective:  Physical Exam  Neurovascular status intact thick lesion formation both feet that are painful when pressed     Assessment:  Chronic lesion formation bilateral with pain     Plan:  Debrided lesions both feet neurogenic bleeding reappoint routine care

## 2022-11-06 ENCOUNTER — Ambulatory Visit: Payer: Medicare Other

## 2022-11-06 DIAGNOSIS — M25512 Pain in left shoulder: Secondary | ICD-10-CM | POA: Diagnosis not present

## 2022-11-06 DIAGNOSIS — M25661 Stiffness of right knee, not elsewhere classified: Secondary | ICD-10-CM

## 2022-11-06 DIAGNOSIS — M25552 Pain in left hip: Secondary | ICD-10-CM

## 2022-11-06 DIAGNOSIS — G8929 Other chronic pain: Secondary | ICD-10-CM

## 2022-11-06 DIAGNOSIS — M6281 Muscle weakness (generalized): Secondary | ICD-10-CM

## 2022-11-06 DIAGNOSIS — M545 Low back pain, unspecified: Secondary | ICD-10-CM
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
## 2022-11-09 ENCOUNTER — Encounter
Payer: Medicare Other | Attending: Physical Medicine and Rehabilitation | Admitting: Physical Medicine and Rehabilitation

## 2022-11-09 ENCOUNTER — Encounter: Payer: Self-pay | Admitting: Physical Medicine and Rehabilitation

## 2022-11-09 VITALS — BP 138/82 | HR 93 | Ht 66.5 in | Wt 224.0 lb

## 2022-11-09 DIAGNOSIS — M62838 Other muscle spasm: Secondary | ICD-10-CM | POA: Insufficient documentation

## 2022-11-09 DIAGNOSIS — M7918 Myalgia, other site: Secondary | ICD-10-CM | POA: Diagnosis present

## 2022-11-09 DIAGNOSIS — M7522 Bicipital tendinitis, left shoulder: Secondary | ICD-10-CM | POA: Diagnosis present

## 2022-11-09 DIAGNOSIS — M19012 Primary osteoarthritis, left shoulder: Secondary | ICD-10-CM | POA: Diagnosis present

## 2022-11-09 DIAGNOSIS — M19011 Primary osteoarthritis, right shoulder: Secondary | ICD-10-CM | POA: Diagnosis present

## 2022-11-09 MED ORDER — TRIAMCINOLONE ACETONIDE 40 MG/ML IJ SUSP
80.0000 mg | Freq: Once | INTRAMUSCULAR | Status: AC
  Administered 2022-11-09: 80 mg via INTRAMUSCULAR

## 2022-11-09 MED ORDER — LIDOCAINE HCL 1 % IJ SOLN
5.0000 mL | Freq: Once | INTRAMUSCULAR | Status: AC
  Administered 2022-11-09: 5 mL

## 2022-11-09 NOTE — Patient Instructions (Signed)
Plan: steroid injection was performed at  L bicipital tendinitis and L subacromial  shoulders- L side only using 1% plain Lidocaine and 40mg  /1cc of Kenalog. This was well tolerated.  Cleaned with betadine x3 and allowed to dry- then alcohol then injected using 27 gauge 1.5 inch needle- no bleeding or complications.    F/U in 3 months for steroid injections of shoulders/biceps tendons.  Lidocaine will kick in 15 minutes- and wear off tonight- the steroid will kick in tomorrow within 24 hours and take up to 72 hours to fully kick in.   2. Patient here for trigger point injections for myofascial pain  Consent done and on chart.  Cleaned areas with alcohol and injected using a 27 gauge 1.5 inch needle  Injected 2 cc- wasted 1cc-  Using 1% Lidocaine with no EPI  Upper traps L side only x2 Levators- L side only x1 Posterior scalenes Middle scalenes Splenius Capitus Pectoralis Major Rhomboids Infraspinatus Teres Major/minor Thoracic paraspinals Lumbar paraspinals Other injections-    Patient's level of pain prior was 6-7/10 Current level of pain after injections is- about the same- - slightly more ROM of L shoulder  There was no bleeding or complications.  Patient was advised to drink a lot of water on day after injections to flush system Will have increased soreness for 12-48 hours after injections.  Can use Lidocaine patches the day AFTER injections Can use theracane on day of injections in places didn't inject Can use heating pad 4-6 hours AFTER injections   3. Doesn't need refills- Tylenol #3 doesn't need refills   4. Can do Trazodone as needed- no refill since 7/23- let me know when needs refills.   5 Con't Tizanidine/Zanaflex- working great- has 3 months of refills.    6. F/U in 51months- for L shoulder injections x2 and TrP injections.

## 2022-11-09 NOTE — Addendum Note (Signed)
Addended by: Jasmine December T on: 11/09/2022 03:06 PM   Modules accepted: Orders

## 2022-11-09 NOTE — Progress Notes (Signed)
Patient is a 72 yr old R handed female with hx of  Bronchiesctasis, CHF, prediabetes- with A1c of 5.6- as of 02/15/21- - not on blood thinners, has a defibrillator, GERD, and fibromyalgia issues- dx'd 10 years ago.  Here for f/u on chronic pain and trigger oint injections. Last got subacromial shoulder injections B/L on 03/29/21.  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  Here for B/L shoulder injections- last done 05/25/22- and trigger point injections.    Hasn't figured what lump on breast is yet- has to call Surgeon to make appointment for biopsy.   Son got cardiac transplant.  Finally- last Thursday.   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   Has continued to gain weight, in spite of Jardiance- eating too much.   Weight 224- up from 218 lbs-   Granddaughter living with her, but won't stay off phone and won't get job- moved in 2 months ago- and told therapist- that pt doesn't care about her. Is 72 yrs old-  and supposedly moves money on [employer].   Mainly takes Tylenol to sleep at night- makes her sleepy, so doesn't take during day.   Loves the Zanaflex- working so much better  Exam: Awake, alert, appropriate, NAD TTP on AC joint as well biceps tendon origin- also TTP over AC joint on R shoulder, but less than both.     Plan: steroid injection was performed at  L bicipital tendinitis and L subacromial  shoulders- L side only using 1% plain Lidocaine and 40mg  /1cc of Kenalog. This was well tolerated.  Cleaned with betadine x3 and allowed to dry- then alcohol then injected using 27 gauge 1.5 inch needle- no bleeding or complications.    F/U in 3 months for steroid injections of shoulders/biceps tendons.  Lidocaine will kick in 15 minutes- and wear off tonight- the steroid will kick in tomorrow within 24 hours and take up to 72 hours to fully kick in.   2. Patient here for trigger point  injections for myofascial pain  Consent done and on chart.  Cleaned areas with alcohol and injected using a 27 gauge 1.5 inch needle  Injected 2 cc- wasted 1cc-  Using 1% Lidocaine with no EPI  Upper traps L side only x2 Levators- L side only x1 Posterior scalenes Middle scalenes Splenius Capitus Pectoralis Major Rhomboids Infraspinatus Teres Major/minor Thoracic paraspinals Lumbar paraspinals Other injections-    Patient's level of pain prior was 6-7/10 Current level of pain after injections is- about the same- - slightly more ROM of L shoulder  There was no bleeding or complications.  Patient was advised to drink a lot of water on day after injections to flush system Will have increased soreness for 12-48 hours after injections.  Can use Lidocaine patches the day AFTER injections Can use theracane on day of injections in places didn't inject Can use heating pad 4-6 hours AFTER injections   3. Doesn't need refills- Tylenol #3 doesn't need refills   4. Can do Trazodone as needed- no refill since 7/23- let me know when needs refills.   5 Con't Tizanidine/Zanaflex- working great- has 3 months of refills.    6. F/U in 3 months- for L shoulder injections x2 and TrP injections.    I spent a total of 27   minutes on total care today- >50% coordination of care- due to 15 minutes on injections  and 12 minutes on discussion of meds and how to handle. D/w trying to get her better pain control.

## 2022-11-13 ENCOUNTER — Ambulatory Visit: Payer: Medicare Other

## 2022-11-13 DIAGNOSIS — M25552 Pain in left hip: Secondary | ICD-10-CM

## 2022-11-13 DIAGNOSIS — G8929 Other chronic pain: Secondary | ICD-10-CM

## 2022-11-13 DIAGNOSIS — M25512 Pain in left shoulder: Secondary | ICD-10-CM | POA: Diagnosis not present

## 2022-11-13 DIAGNOSIS — M25661 Stiffness of right knee, not elsewhere classified: Secondary | ICD-10-CM

## 2022-11-13 DIAGNOSIS — M6281 Muscle weakness (generalized): Secondary | ICD-10-CM

## 2022-11-13 DIAGNOSIS — M545 Low back pain, unspecified: Secondary | ICD-10-CM

## 2022-11-13 DIAGNOSIS — R252 Cramp and spasm: Secondary | ICD-10-CM

## 2022-11-13 NOTE — Therapy (Signed)
OUTPATIENT PHYSICAL THERAPY TREATMENT NOTE/R knee Eval   Patient Name: [name] MRN: [id number] DOB:November 25, 1950, 72 y.o., female Today's Date: 11/13/2022  PCP: Camillia Herter, NP   REFERRING PROVIDER: Dorna Mai, MD; Clance Boll Gasper Sells, PA-C   See note below for Objective Data and Assessment of Progress/Goals.      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   Bronchiectasis ([place])    Cerebral aneurysm, nonruptured    had a clip put in   CHF (congestive heart failure) (HCC)    Clostridium difficile infection    Depressive disorder, not elsewhere classified    Diverticulosis of colon (without mention of hemorrhage)    Esophageal reflux    Fatty liver    Fibromyalgia    Gastritis    GERD (gastroesophageal reflux disease)    GI bleed 2004   Glaucoma    Hiatal hernia    History of COVID-19 05/04/2021   Hyperlipidemia    Hypertension    Hypothyroidism    Internal hemorrhoids    Joint pain    Multifocal pneumonia 06/03/2021   Obstructive sleep apnea (adult) (pediatric)    Osteoarthritis    Ostium secundum type atrial septal defect    Other chronic nonalcoholic liver disease    Other pulmonary embolism and infarction    Palpitations    Paroxysmal ventricular tachycardia (HCC)    Personal history of radiation therapy    PONV (postoperative nausea and vomiting)    Presence  of permanent cardiac pacemaker    PUD (peptic ulcer disease)    Radiation 02/03/15-03/10/15   Right Breast   Sarcoid    per pt , not sure   Schatzki's ring    Shortness of breath    Sleep apnea    Stroke [facility]) 2013   tia/ pt feels it was around 2008 0r 2009   Takotsubo syndrome    Tubular adenoma of colon    Unspecified transient cerebral ischemia    Unspecified vitamin D deficiency    Past Surgical History:  Procedure Laterality Date   ABI  2006   normal   BREAST EXCISIONAL BIOPSY     BREAST LUMPECTOMY Right 2016   BREAST LUMPECTOMY WITH RADIOACTIVE SEED LOCALIZATION Right 01/03/2015   Procedure: BREAST LUMPECTOMY WITH RADIOACTIVE SEED LOCALIZATION;  Surgeon: Autumn Messing III, MD;  Location: [facility];  Service: General;  Laterality: Right;   CARDIAC DEFIBRILLATOR PLACEMENT  2006; 2012   BSX single chamber ICD   carotid dopplers  2006   neg   CEREBRAL ANEURYSM REPAIR  02/1999   COLONOSCOPY     HAMMER TOE SURGERY  11/15/2020   3rd digit bilateral feet  hospitalization  2004   GI bleed, PUD, diverticulosis (EGD,colonscopy)   hospitalization     PE, NSVT, s/p defib   KNEE ARTHROSCOPY     bilateral   LEFT HEART CATHETERIZATION WITH CORONARY ANGIOGRAM N/A 02/22/2012   Procedure: LEFT HEART CATHETERIZATION WITH CORONARY ANGIOGRAM;  Surgeon: Burnell Blanks, MD;  Location: [facility] CATH LAB;  Service: Cardiovascular;  Laterality: N/A;   PARTIAL HYSTERECTOMY     Fibroids   TONGUE BIOPSY  12/12/2017   due to sore tongue and white patches/abnormal cells   TOTAL KNEE ARTHROPLASTY Left 02/13/2016   Procedure: TOTAL KNEE ARTHROPLASTY;  Surgeon: Vickey Huger, MD;  Location: [facility];  Service: Orthopedics;  Laterality: Left;   TOTAL KNEE ARTHROPLASTY Right 05/22/2021   Procedure: TOTAL KNEE ARTHROPLASTY;  Surgeon: Vickey Huger, MD;  Location: [facility];  Service: Orthopedics;  Laterality: Right;  with block   UPPER GASTROINTESTINAL ENDOSCOPY     Patient Active Problem List   Diagnosis Date  Noted   Bicipital tendinitis of left shoulder 11/09/2022   Partial symptomatic epilepsy with complex partial seizures, not intractable, without status epilepticus ([place]) 08/10/2022   Low back pain 08/10/2022   Gait abnormality 05/04/2022   Muscle spasm 05/04/2022   Upper airway cough syndrome 01/19/2022   Chronic left shoulder pain 11/13/2021   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   Localized swelling of right lower leg 08/29/2021   Tonsillar exudate 08/29/2021   Candida vaginitis 08/28/2021   Chronic pain syndrome 08/25/2021   Multifocal pneumonia 06/03/2021   Pre-operative respiratory examination 05/18/2021   Statin myopathy 04/19/2021   Pain in joint of right shoulder 02/10/2021   Arthritis of both acromioclavicular joints 01/02/2021   Trochanteric bursitis of right hip 10/03/2020   Tailbone injury 05/05/2020   UTI (urinary tract infection) 05/05/2020   Insomnia 05/05/2020   Leg cramps 02/16/2020   Dysuria 02/16/2020   Right hip impingement syndrome 02/01/2020   Right bicipital tenosynovitis 02/01/2020   Bronchiectasis without complication ([place]) 0000000   Myofascial pain 12/21/2019   Estrogen deficiency 12/10/2019   Obesity (BMI 30-39.9) 12/10/2019   Bronchiectasis with (acute) exacerbation ([place]) 10/23/2019   Elevated LFTs 10/15/2019   Lower abdominal pain 09/01/2019   Neck pain 10/25/2017   Paresthesias in left hand 10/25/2017   Paresthesia of foot, bilateral 04/12/2017   Primary osteoarthritis of both hands 09/27/2016   Primary osteoarthritis of both knees 09/27/2016   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   Nasopharyngitis, 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/2016   S/P total knee replacement 02/13/2016   Chronic pain of both knees 12/06/2015   Globus pharyngeus 05/17/2015   Genetic testing 12/23/2014   Family history of breast cancer    Family history of colon cancer    Family history of pancreatic cancer    History of breast cancer 11/23/2014   Allergy to ACE inhibitors  09/27/2014   Prediabetes 06/23/2013   Muscle spasms of both lower extremities 01/15/2013   Hx of Clostridium difficile infection 10/10/2012   Dyspnea on exertion 04/02/2012   NICM (nonischemic cardiomyopathy) ([place]) 03/07/2012   Post-menopausal 01/11/2012   Atypical chest pain 11/07/2011   Obstructive sleep apnea 04/04/2010   V-tach ([place]) 01/03/2009   ICD  [employer]  Single chamber 01/03/2009   PFO (patent foramen ovale) 09/29/2008   Vitamin D deficiency 08/17/2008   Hypothyroidism 11/18/2006   Hyperlipidemia 11/18/2006   Depression with anxiety 11/18/2006   Essential hypertension 11/18/2006   Mitral valve prolapse 11/18/2006   Cerebral aneurysm 11/18/2006   Allergic rhinitis 11/18/2006  GERD 11/18/2006   Diverticulosis of colon 11/18/2006   Fatty liver 11/18/2006   Fibromyalgia 11/18/2006    REFERRING DIAG: M54.50 (ICD-10-CM) - Left-sided low back pain without sciatica, unspecified chronicity; M25.552 (ICD-10-CM) - Left hip pain; Pain in left shoulder; Presence of right artificial knee joint.  THERAPY DIAG:  Chronic left shoulder pain  Chronic pain of right knee  Pain in left hip  Muscle weakness (generalized)  Chronic bilateral low back pain without sciatica  Stiffness of right knee, not elsewhere classified  Cramp and spasm  Rationale for Evaluation and Treatment Rehabilitation  PERTINENT HISTORY:  Automatic cardioverter/defibrillator; Bilat knee replacements; arthritis; fibromyalgia; depression, High BMI  PRECAUTIONS: Pacemaker  SUBJECTIVE:                                                                                                                                                                                      SUBJECTIVE STATEMENT:  Pt reports she is feeling much better with all areas. The R knee hurts intermittently, esp after prolonged sitting.  PAIN:  Are you having pain? Yes: NPRS scale: 2/10 for L hip; 2/10 for low back Pain location:  L lowback, gluteal, lat hip, groin, and ant thigh Pain description: ache, sharp occasionally, constant Aggravating factors: Walking, sleeping, in/out of car, bending over Relieving factors: Muscle relaxors, heat pad, tylenol On Eval pain range=6-8/10  PAIN:  Are you having pain? Yes: NPRS scale: 2/10 Pain location: Superior GH jt and upper trap Pain description: ache, constant Aggravating factors: Arm movement c dressing and reaching up Relieving factors: Resting  PAIN:  Are you having pain? Yes: NPRS scale: 0/10 Pain location: R knee Pain description: ache, intermittent Aggravating factors: bending the knee, sit to stand Relieving factors: Resting  OBJECTIVE: (objective measures completed at initial evaluation unless otherwise dated)  Pt reports xrays were taken with [facility] yesterday. He was able to recall there was no fracture, proper spacing for the joint   PATIENT SURVEYS:  FOTO: Perceived function   38%, predicted   55%  09/04/22: 54%  10/02/22: 54%   SCREENING FOR RED FLAGS: Bowel or bladder incontinence: No Spinal tumors: No Compression fracture: No   COGNITION: Overall cognitive status: Within functional limits for tasks assessed                          SENSATION: WFL   MUSCLE LENGTH: Hamstrings: Right NT deg; Left NT deg Marcello Moores test: Right NT deg; Left NT deg   POSTURE: increased lumbar lordosis   PALPATION: TTP with increased muscle tightness to the L gluteal region   LUMBAR ROM:    AROM eval  10/10/22  Flexion Mod limited, low back pulling pain Reaches min shins, low back pain  Extension Mod limited, pain low back and L hip Min limited, pain on low back  Right lateral flexion Mod limited, pulling pain R hip Min limited , pain in left hip  Left lateral flexion Mod limited, pain across low back Min limited, min low back pain   Right rotation Mod limited, pulling pain R hip and across low back WFL min pain  Left rotation Mod limited, pain across low back  WFL , min low back    (Blank rows = not tested)   LOWER EXTREMITY ROM:     L decreased in comparison to R  due to pain Passive  Right eval Left eval Left 10/02/22 RT 11/06/22 Rt 11/13/22  Hip flexion   P WFL    Hip extension   P     Hip abduction   P WFL    Hip adduction         Hip internal rotation   P WFL    Hip external rotation   P WFL    Knee flexion      95d 100d  Knee extension         Ankle dorsiflexion         Ankle plantarflexion         Ankle inversion         Ankle eversion         P=pain  (Blank rows = not tested)   LOWER EXTREMITY MMT:   L LE strength is deceased in comparison to R due to Pain MMT Right eval Left eval Right 10/02/22 Left 10/02/22 Rt 11/06/22  Hip flexion   P 4+  4- 4  Hip extension   P   4-  Hip abduction   P 4- 4 4-  Hip adduction   P     Hip internal rotation   P     Hip external rotation   P   4  Knee flexion       4  Knee extension       4  Ankle dorsiflexion         Ankle plantarflexion         Ankle inversion         Ankle eversion         P=pain  (Blank rows = not tested)   LUMBAR SPECIAL TESTS:  Straight leg raise test: Negative, Slump test: Negative, and SI Compression/distraction test: Negative          FADER L hip positive   FUNCTIONAL TESTS:  5 times sit to stand: 08/17/22: 40.8 sec 09/18/22= 29.2" (low mat, with UE): 10/02/22: 23.3 sec without UE from standard mat 2 minute walk test: 08/17/22: 185 Feet : 10/02/22: 308 feet    GAIT: Distance walked: 162ft Assistive device utilized: Single point cane Level of assistance: Complete Independence Comments: decreased pace  [place] Assessmnet:  SENSATION: WFL  POSTURE: Forward heads, rounded shoulders  UPPER EXTREMITY ROM:   Active ROM Right 11/13/2022 Left 11/13/2022  Shoulder flexion A120 pain, P130 pain 120  Shoulder extension    Shoulder abduction    Shoulder adduction    Shoulder internal rotation T7 pain T8  Shoulder external rotation T2 pain T3  Elbow  flexion    Elbow extension    Wrist flexion    Wrist extension    Wrist ulnar deviation    Wrist radial deviation  Wrist pronation    Wrist supination    (Blank rows = not tested) C elevation pain increases at 80d and increases UPPER EXTREMITY MMT:  MMT Right 11/13/2022 Left 11/13/2022  Shoulder flexion 120 pain 120  Shoulder extension    Shoulder abduction    Shoulder adduction    Shoulder internal rotation    Shoulder external rotation T2 T3  Middle trapezius    Lower trapezius    Elbow flexion    Elbow extension    Wrist flexion    Wrist extension    Wrist ulnar deviation    Wrist radial deviation    Wrist pronation    Wrist supination    Grip strength (lbs)    (Blank rows = not tested)  SHOULDER SPECIAL TESTS:  Impingement tests: Hawkins/Kennedy impingement test: positive   Instability tests:  Negative  Rotator cuff assessment: Empty can test: negative and Full can test: negative  Both negative for overt weakness, min provocation of pain.  Biceps assessment: Yergason's test: negative   No overt weakness JOINT MOBILITY TESTING:  WNLs  PALPATION:  TTP for the superior Bassett area and upper trap    TODAY'S TREATMENT: [facility] Adult PT Treatment:                                                DATE: 11/13/22 Therapeutic Exercise: NuStep 5 min L5 UE/LE Bridging 2x10 3 sec Hip abd 2x10 3 sec GTB R SLR c quad set 1x15 3" Heel slide c strap assist x10 10" Heel slide on disc x15 LAQ 2x10 3" 3# Updated HEP   [facility] Adult PT Treatment:                                                DATE: 11/06/22 Therapeutic Exercise: NuStep 5 min L5 UE/LE Bridging 2x10 3 sec Hip abd 2x10 3 sec GTB R SLR c quad set 2x10 3" Heel slide c strap assist x5 30" LAQ 2x10 3" Updated HEP  [facility] Adult PT Treatment:                                                DATE: 10/25/22 Therapeutic Exercise: LTR x10 10 Supine Piriformis stretch x2 20" Seated neural glide x10 each Seated hamstring  stretch x2 20" each Seated ball press for abs 2x10, vc for breathing STS from min elevated bari-mat 2x5 UE/back ext RTB 2x10 Manual Therapy: STM/DTM to the L lumbar paraspinals and QL and to the L gluteal and piriformis muscles Skilled palpation to identify TrPs and taut muscle bands  Trigger Point Dry Needling Treatment: Pre-treatment instruction: Patient instructed on dry needling rationale, procedures, and possible side effects including pain during treatment (achy,cramping feeling), bruising, drop of blood, lightheadedness, nausea, sweating. Patient Consent Given: Yes Education handout provided: Yes Muscles treated: L lower lumbar paraspinals and multifidi; piriformis, glut medius, glut minimus Needle size and number: .30x73mm x 1 and .30x136mm x 1 Electrical stimulation performed: No Parameters: N/A Treatment response/outcome: Twitch response elicited Post-treatment instructions: Patient instructed to expect possible mild to moderate muscle soreness later today and/or  tomorrow. Patient instructed in methods to reduce muscle soreness and to continue prescribed HEP. If patient was dry needled over the lung field, patient was instructed on signs and symptoms of pneumothorax and, however unlikely, to see immediate medical attention should they occur. Patient was also educated on signs and symptoms of infection and to seek medical attention should they occur. Patient verbalized understanding of these instructions and education.  PATIENT EDUCATION:  Education details: Eval findings, POC, HEP, self care Person educated: Patient Education method: Explanation, Demonstration, Tactile cues, Verbal cues, and Handouts Education comprehension: verbalized understanding, returned demonstration, verbal cues required, and tactile cues required   HOME EXERCISE PROGRAM: Access Code: [email or web address] URL: [email or web address] Date: 11/06/2022 Prepared by: Gar Ponto  Exercises - Supine Lower  Trunk Rotation  - 1-2 x daily - 7 x weekly - 1 sets - 5 reps - 5 hold - Supine Piriformis Stretch with Foot on Ground  - 1-2 x daily - 7 x weekly - 3 reps - 15 hold - Hooklying Single Knee to Chest  - 1-2 x daily - 7 x weekly - 1 sets - 3 reps - 15 hold - Supine Bridge  - 1-2 x daily - 7 x weekly - 1 sets - 10 reps - 3 hold - Hooklying Clamshell with Resistance  - 1 x daily - 7 x weekly - 1 sets - 10 reps - 3 hold - Supine Hip Adduction Isometric with Ball  - 1 x daily - 7 x weekly - 1 sets - 10 reps - 3 hold - Seated Flexion Stretch with Swiss Ball  - 1 x daily - 7 x weekly - 1 sets - 6 reps - 20 hold - Seated Abdominal Press into [employer]  - 1 x daily - 7 x weekly - 1 sets - 10 reps - 3 hold - Standing Hip Flexor Stretch  - 1 x daily - 7 x weekly - 1 sets - 10 reps - 20 hold - Supine Posterior Pelvic Tilt  - 1 x daily - 7 x weekly - 1 sets - 10 reps - 3 hold - Seated March with Resistance  - 1 x daily - 7 x weekly - 3 sets - 10 reps - seated clam with resistance band  - 1 x daily - 7 x weekly - 3 sets - 10 reps - Active Straight Leg Raise with Quad Set  - 1 x daily - 7 x weekly - 2 sets - 10 reps - 3 hold - Seated Long Arc Quad  - 1 x daily - 7 x weekly - 2 sets - 10 reps - 3 hold - Supine Heel Slide with Strap  - 1 x daily - 7 x weekly - 1 sets - 3-5 reps - 30 hold   ASSESSMENT:   CLINICAL IMPRESSION: PT was completed for LE strengthening with focus on the R quad and also for R knee ROM. AROM has improved from 95 to 100d since the last PT session. Pt reports quad strain/discomfort with the strengthening therex. Pt's subjective report indicates all areas of discomfort are better. With walking, pt walked with an improved pace and it was less labored. Pt will continue to benefit from skilled PT to address impairments for improved function.  OBJECTIVE IMPAIRMENTS: decreased activity tolerance, decreased balance, diffculity walking, decreased ROM, decreased strength, increased muscle spasms,  obesity, and pain.    ACTIVITY LIMITATIONS: carrying, lifting, bending, sitting, standing, squatting, sleeping, stairs, and locomotion level   PARTICIPATION LIMITATIONS: meal prep,  cleaning, laundry, shopping, and community activity   PERSONAL FACTORS: Fitness, Past/current experiences, Time since onset of injury/illness/exacerbation, and 3+ comorbidities:    Mod limited, pulling pain R hipare also affecting patient's functional outcome.    REHAB POTENTIAL: Good   CLINICAL DECISION MAKING: Evolving/moderate complexity   EVALUATION COMPLEXITY: Moderate     GOALS:   SHORT TERM GOALS: Target date: 08/24/22   Pt will be Ind in an initial HEP  Baseline: initiated Status: proper completion Goal status: MET   2.  Pt will voice understanding of measures to assist in pain reduction Baseline: initiated Status: HEP and sleeping positions with support; use of cold pack Goal status: MET   LONG TERM GOALS: Target date: 12/07/22   Pt will be Ind in a final HEP to maintain achieved LOF Baseline: initiated Goal status: ONGOING   2.  Pt's trunk ROM will improve to minimal or less for improved function and as demonstration of decreased pain Baseline: see flow sheets 10/10/22: Lumbar AROM minimally improved and with min pain -see chart Goal status: ONGOING   3.  Pt will report a decreased in pain to 3/10 or less with daily activities Baseline: 6-8/10 Status: 09/11/22= 2/10 10/10/22: 2/10 on average Goal status: MET   4.  Pt L hip will demonstrate ROM and strength equal to that of the R hip for improved function Baseline: pain with all passive movements 10/02/22: PROM equal bilateral with min stretch , left hip weak compared to right Goal status: PARTIALLY MET   5. Improve 5xSTS by MCID of 5" and 2MWT by MCID of 56ft as indication of improved functional mobility  Baseline: TBA STATUS= 09/18/22= 29.2" (low mat, with UE) Goal status: MET for 5xSTS and 2 MWT   6.  Pt's FOTO score will  improved to the predicted value of 55% as indication of improved function  Baseline: 38% 10/05/22: 54% Goal status: Improved   7. Pt will report a decrease in L shoulder pain to 2/10 or less for improved QOL   Baseline: 5/10 Goal status: Initial  8. Pt will be able to lift 5# from waist height to above shoulder for functional use of her L UE Baseline: NT Goal status: Initial  9. Increase R knee flexion AROM to 105d or greater for functional mobility sitting and negotiating steps Baseline: 95d Goal status: Initial  10.Increase R hip and knee strength to 4+/5  for improved functional mobility Baseline: See flow sheets Goal status: Initial  PLAN:   PT FREQUENCY: 2x/week   PT DURATION: 6 weeks   PLANNED INTERVENTIONS: Therapeutic exercises, Therapeutic activity, Neuromuscular re-education, Balance training, Gait training, Patient/Family education, Self Care, Joint mobilization, Stair training, Aquatic Therapy, Dry Needling, Electrical stimulation, Spinal manipulation, Spinal mobilization, Cryotherapy, Moist heat, Taping, Traction, Ultrasound, Ionotophoresis 4mg /ml Dexamethasone, Manual therapy, and Re-evaluation.   PLAN FOR NEXT SESSION: ; L hip strength and lumbar flexibility for completion of goals;   Pt has pacemaker, NO IONTO or ESTIM   Dannis Deroche MS, PT 11/13/22 12:21 PM

## 2022-11-14 ENCOUNTER — Ambulatory Visit: Payer: Medicare Other

## 2022-11-15 ENCOUNTER — Ambulatory Visit (INDEPENDENT_AMBULATORY_CARE_PROVIDER_SITE_OTHER): Payer: Medicare Other | Admitting: Internal Medicine

## 2022-11-15 DIAGNOSIS — R0609 Other forms of dyspnea: Secondary | ICD-10-CM | POA: Diagnosis not present

## 2022-11-15 DIAGNOSIS — J849 Interstitial pulmonary disease, unspecified: Secondary | ICD-10-CM

## 2022-11-15 LAB — PULMONARY FUNCTION TEST
DL/VA % pred: 87 %
DL/VA: 3.54 ml/min/mmHg/L
DLCO cor % pred: 52 %
DLCO cor: 11.05 ml/min/mmHg
DLCO unc % pred: 52 %
DLCO unc: 11.05 ml/min/mmHg
FEF 25-75 Pre: 2.89 L/sec
FEF2575-%Pred-Pre: 155 %
FEV1-%Pred-Pre: 88 %
FEV1-Pre: 2.12 L
FEV1FVC-%Pred-Pre: 122 %
FEV6-%Pred-Pre: 76 %
FEV6-Pre: 2.3 L
FEV6FVC-%Pred-Pre: 104 %
FVC-%Pred-Pre: 72 %
FVC-Pre: 2.3 L
Pre FEV1/FVC ratio: 92 %
Pre FEV6/FVC Ratio: 100 %

## 2022-11-15 NOTE — Progress Notes (Signed)
Spiro/DLCO performed today.  

## 2022-11-15 NOTE — Patient Instructions (Signed)
Spiro/DLCO performed today.  

## 2022-11-16 ENCOUNTER — Other Ambulatory Visit: Payer: Self-pay

## 2022-11-16 DIAGNOSIS — E785 Hyperlipidemia, unspecified: Secondary | ICD-10-CM

## 2022-11-17 LAB — LIPID PANEL
Chol/HDL Ratio: 2.8 ratio (ref 0.0–4.4)
Cholesterol, Total: 229 mg/dL — ABNORMAL HIGH (ref 100–199)
HDL: 81 mg/dL (ref >39)
LDL Chol Calc (NIH): 132 mg/dL — ABNORMAL HIGH (ref 0–99)
Triglycerides: 92 mg/dL (ref 0–149)
VLDL Cholesterol Cal: 16 mg/dL (ref 5–40)

## 2022-11-20 ENCOUNTER — Ambulatory Visit: Payer: Medicare Other | Attending: Family Medicine

## 2022-11-20 ENCOUNTER — Other Ambulatory Visit: Payer: Self-pay | Admitting: Internal Medicine

## 2022-11-20 DIAGNOSIS — M25512 Pain in left shoulder: Secondary | ICD-10-CM | POA: Diagnosis present

## 2022-11-20 DIAGNOSIS — M6281 Muscle weakness (generalized): Secondary | ICD-10-CM | POA: Diagnosis present

## 2022-11-20 DIAGNOSIS — M25552 Pain in left hip: Secondary | ICD-10-CM | POA: Insufficient documentation

## 2022-11-20 DIAGNOSIS — G8929 Other chronic pain: Secondary | ICD-10-CM | POA: Diagnosis present

## 2022-11-20 DIAGNOSIS — M25561 Pain in right knee: Secondary | ICD-10-CM | POA: Insufficient documentation

## 2022-11-20 MED ORDER — EMPAGLIFLOZIN 10 MG PO TABS: 10.0000 mg | ORAL_TABLET | Freq: Every day | ORAL | 3 refills | Status: DC

## 2022-11-20 NOTE — Telephone Encounter (Signed)
Pt's pharmacy is requesting a refill on Jardaince 10 mg tablet. The strength is not on the medication listed on med list. Would Dr. Caryl Comes like to refill this medication with 10 mg? Please address

## 2022-11-20 NOTE — Telephone Encounter (Signed)
Spoke with pt and confirmed pt continues to take Jardiance 10mg  despite Dr Olin Pia recommendation to discontinue 02/2022 due to vaginal itching.  Pt states she discovered her vaginal itching was related to sugar intake; therefore, she has continued the Jardiance without further problems and requests that medication be refilled.

## 2022-11-20 NOTE — Therapy (Signed)
OUTPATIENT PHYSICAL THERAPY TREATMENT NOTE/R knee Eval   Patient Name: [name] MRN: [id number] DOB:1950-10-27, 72 y.o., female Today's Date: 11/20/2022  PCP: Camillia Herter, NP   REFERRING PROVIDER: Dorna Mai, MD; Clance Boll Gasper Sells, PA-C   See note below for Objective Data and Assessment of Progress/Goals.      END OF SESSION:   PT End of Session - 11/20/22 1332     Visit Number 19    Number of Visits 27    Date for PT Re-Evaluation 12/07/22    Authorization Type MEDICARE PART A AND B    Progress Note Due on Visit 20    PT Start Time 0940    PT Stop Time 1015    PT Time Calculation (min) 35 min    Activity Tolerance Patient tolerated treatment well    Behavior During Therapy WFL for tasks assessed/performed                 Past Medical History:  Diagnosis Date   AICD (automatic cardioverter/defibrillator) present    Anxiety    Arthritis    Back pain    Breast cancer 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   Bronchiectasis    Cerebral aneurysm, nonruptured    had a clip put in   CHF (congestive heart failure)    Clostridium difficile infection    Depressive disorder, not elsewhere classified    Diverticulosis of colon (without mention of hemorrhage)    Esophageal reflux    Fatty liver    Fibromyalgia    Gastritis    GERD (gastroesophageal reflux disease)    GI bleed 2004   Glaucoma    Hiatal hernia    History of COVID-19 05/04/2021   Hyperlipidemia    Hypertension    Hypothyroidism    Internal hemorrhoids    Joint pain    Multifocal pneumonia 06/03/2021   Obstructive sleep apnea (adult) (pediatric)    Osteoarthritis    Ostium secundum type atrial septal defect    Other chronic nonalcoholic liver disease    Other pulmonary embolism and infarction    Palpitations    Paroxysmal ventricular tachycardia    Personal history of radiation therapy    PONV (postoperative nausea and vomiting)    Presence of permanent cardiac  pacemaker    PUD (peptic ulcer disease)    Radiation 02/03/15-03/10/15   Right Breast   Sarcoid    per pt , not sure   Schatzki's ring    Shortness of breath    Sleep apnea    Stroke 2013   tia/ pt feels it was around 2008 0r 2009   Takotsubo syndrome    Tubular adenoma of colon    Unspecified transient cerebral ischemia    Unspecified vitamin D deficiency    Past Surgical History:  Procedure Laterality Date   ABI  2006   normal   BREAST EXCISIONAL BIOPSY     BREAST LUMPECTOMY Right 2016   BREAST LUMPECTOMY WITH RADIOACTIVE SEED LOCALIZATION Right 01/03/2015   Procedure: BREAST LUMPECTOMY WITH RADIOACTIVE SEED LOCALIZATION;  Surgeon: Autumn Messing III, MD;  Location: [facility];  Service: General;  Laterality: Right;   CARDIAC DEFIBRILLATOR PLACEMENT  2006; 2012   BSX single chamber ICD   carotid dopplers  2006   neg   CEREBRAL ANEURYSM REPAIR  02/1999   COLONOSCOPY     HAMMER TOE SURGERY  11/15/2020   3rd digit bilateral feet    hospitalization  2004   GI bleed, PUD, diverticulosis (EGD,colonscopy)   hospitalization     PE, NSVT, s/p defib   KNEE ARTHROSCOPY     bilateral   LEFT HEART CATHETERIZATION WITH CORONARY ANGIOGRAM N/A 02/22/2012   Procedure: LEFT HEART CATHETERIZATION WITH CORONARY ANGIOGRAM;  Surgeon: Burnell Blanks, MD;  Location: [facility] CATH LAB;  Service: Cardiovascular;  Laterality: N/A;   PARTIAL HYSTERECTOMY     Fibroids   TONGUE BIOPSY  12/12/2017   due to sore tongue and white patches/abnormal cells   TOTAL KNEE ARTHROPLASTY Left 02/13/2016   Procedure: TOTAL KNEE ARTHROPLASTY;  Surgeon: Vickey Huger, MD;  Location: [place] [facility];  Service: Orthopedics;  Laterality: Left;   TOTAL KNEE ARTHROPLASTY Right 05/22/2021   Procedure: TOTAL KNEE ARTHROPLASTY;  Surgeon: Vickey Huger, MD;  Location: [facility];  Service: Orthopedics;  Laterality: Right;  with block   UPPER GASTROINTESTINAL ENDOSCOPY     Patient Active Problem List   Diagnosis Date Noted   Bicipital  tendinitis of left shoulder 11/09/2022   Partial symptomatic epilepsy with complex partial seizures, not intractable, without status epilepticus 08/10/2022   Low back pain 08/10/2022   Gait abnormality 05/04/2022   Muscle spasm 05/04/2022   Upper airway cough syndrome 01/19/2022   Chronic left shoulder pain 11/13/2021   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   Localized swelling of right lower leg 08/29/2021   Tonsillar exudate 08/29/2021   Candida vaginitis 08/28/2021   Chronic pain syndrome 08/25/2021   Multifocal pneumonia 06/03/2021   Pre-operative respiratory examination 05/18/2021   Statin myopathy 04/19/2021   Pain in joint of right shoulder 02/10/2021   Arthritis of both acromioclavicular joints 01/02/2021   Trochanteric bursitis of right hip 10/03/2020   Tailbone injury 05/05/2020   UTI (urinary tract infection) 05/05/2020   Insomnia 05/05/2020   Leg cramps 02/16/2020   Dysuria 02/16/2020   Right hip impingement syndrome 02/01/2020   Right bicipital tenosynovitis 02/01/2020   Bronchiectasis without complication 0000000   Myofascial pain 12/21/2019   Estrogen deficiency 12/10/2019   Obesity (BMI 30-39.9) 12/10/2019   Bronchiectasis with (acute) exacerbation 10/23/2019   Elevated LFTs 10/15/2019   Lower abdominal pain 09/01/2019   Neck pain 10/25/2017   Paresthesias in left hand 10/25/2017   Paresthesia of foot, bilateral 04/12/2017   Primary osteoarthritis of both hands 09/27/2016   Primary osteoarthritis of both knees 09/27/2016   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   Nasopharyngitis, 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/2016   S/P total knee replacement 02/13/2016   Chronic pain of both knees 12/06/2015   Globus pharyngeus 05/17/2015   Genetic testing 12/23/2014   Family history of breast cancer    Family history of colon cancer    Family history of pancreatic cancer    History of breast cancer 11/23/2014   Allergy to ACE inhibitors 09/27/2014   Prediabetes 06/23/2013   Muscle  spasms of both lower extremities 01/15/2013   Hx of Clostridium difficile infection 10/10/2012   Dyspnea on exertion 04/02/2012   NICM (nonischemic cardiomyopathy) 03/07/2012   Post-menopausal 01/11/2012   Atypical chest pain 11/07/2011   Obstructive sleep apnea 04/04/2010   V-tach 01/03/2009   ICD  [employer]  Single chamber 01/03/2009   PFO (patent foramen ovale) 09/29/2008   Vitamin D deficiency 08/17/2008   Hypothyroidism 11/18/2006   Hyperlipidemia 11/18/2006   Depression with anxiety 11/18/2006   Essential hypertension 11/18/2006   Mitral valve prolapse 11/18/2006   Cerebral aneurysm 11/18/2006   Allergic rhinitis 11/18/2006   GERD 11/18/2006   Diverticulosis of colon  11/18/2006   Fatty liver 11/18/2006   Fibromyalgia 11/18/2006    REFERRING DIAG: M54.50 (ICD-10-CM) - Left-sided low back pain without sciatica, unspecified chronicity; M25.552 (ICD-10-CM) - Left hip pain; Pain in left shoulder; Presence of right artificial knee joint.  THERAPY DIAG:  Chronic left shoulder pain  Chronic pain of right knee  Pain in left hip  Muscle weakness (generalized)  Rationale for Evaluation and Treatment Rehabilitation  PERTINENT HISTORY:  Automatic cardioverter/defibrillator; Bilat knee replacements; arthritis; fibromyalgia; depression, High BMI  PRECAUTIONS: Pacemaker  SUBJECTIVE:                                                                                                                                                                                      SUBJECTIVE STATEMENT:  Pt reports she is continuing to feel better with all areas of pain.  PAIN:  Are you having pain? Yes: NPRS scale: 0/10 for L hip; 0/10 for low back Pain location: L lowback, gluteal, lat hip, groin, and ant thigh Pain description: ache, sharp occasionally, constant Aggravating factors: Walking, sleeping, in/out of car, bending over Relieving factors: Muscle relaxors, heat pad, tylenol On  Eval pain range=6-8/10  PAIN:  Are you having pain? Yes: NPRS scale: 2-3/10 Pain location: Superior GH jt and upper trap Pain description: ache, constant Aggravating factors: Arm movement c dressing and reaching up Relieving factors: Resting  PAIN:  Are you having pain? Yes: NPRS scale: 3/10 Pain location: R knee Pain description: ache, intermittent Aggravating factors: bending the knee, sit to stand Relieving factors: Resting  OBJECTIVE: (objective measures completed at initial evaluation unless otherwise dated)  Pt reports xrays were taken with [facility] yesterday. He was able to recall there was no fracture, proper spacing for the joint   PATIENT SURVEYS:  FOTO: Perceived function   38%, predicted   55%  09/04/22: 54%  10/02/22: 54%   SCREENING FOR RED FLAGS: Bowel or bladder incontinence: No Spinal tumors: No Compression fracture: No   COGNITION: Overall cognitive status: Within functional limits for tasks assessed                          SENSATION: WFL   MUSCLE LENGTH: Hamstrings: Right NT deg; Left NT deg Marcello Moores test: Right NT deg; Left NT deg   POSTURE: increased lumbar lordosis   PALPATION: TTP with increased muscle tightness to the L gluteal region   LUMBAR ROM:    AROM eval 10/10/22  Flexion Mod limited, low back pulling pain Reaches min shins, low back pain  Extension Mod limited, pain low back and L hip Min limited, pain on low back  Right  lateral flexion Mod limited, pulling pain R hip Min limited , pain in left hip  Left lateral flexion Mod limited, pain across low back Min limited, min low back pain   Right rotation Mod limited, pulling pain R hip and across low back WFL min pain  Left rotation Mod limited, pain across low back WFL , min low back    (Blank rows = not tested)   LOWER EXTREMITY ROM:     L decreased in comparison to R  due to pain Passive  Right eval Left eval Left 10/02/22 RT 11/06/22 Rt 11/13/22  Hip flexion   P WFL    Hip  extension   P     Hip abduction   P WFL    Hip adduction         Hip internal rotation   P WFL    Hip external rotation   P WFL    Knee flexion      95d 100d  Knee extension         Ankle dorsiflexion         Ankle plantarflexion         Ankle inversion         Ankle eversion         P=pain  (Blank rows = not tested)   LOWER EXTREMITY MMT:   L LE strength is deceased in comparison to R due to Pain MMT Right eval Left eval Right 10/02/22 Left 10/02/22 Rt 11/06/22  Hip flexion   P 4+  4- 4  Hip extension   P   4-  Hip abduction   P 4- 4 4-  Hip adduction   P     Hip internal rotation   P     Hip external rotation   P   4  Knee flexion       4  Knee extension       4  Ankle dorsiflexion         Ankle plantarflexion         Ankle inversion         Ankle eversion         P=pain  (Blank rows = not tested)   LUMBAR SPECIAL TESTS:  Straight leg raise test: Negative, Slump test: Negative, and SI Compression/distraction test: Negative          FADER L hip positive   FUNCTIONAL TESTS:  5 times sit to stand: 08/17/22: 40.8 sec 09/18/22= 29.2" (low mat, with UE): 10/02/22: 23.3 sec without UE from standard mat 2 minute walk test: 08/17/22: 185 Feet : 10/02/22: 308 feet    GAIT: Distance walked: 163ft Assistive device utilized: Single point cane Level of assistance: Complete [place] Comments: decreased pace  [place] Assessmnet:  SENSATION: WFL  POSTURE: Forward heads, rounded shoulders  UPPER EXTREMITY ROM:   Active ROM Right 11/20/2022 Left 11/20/2022  Shoulder flexion A120 pain, P130 pain 120  Shoulder extension    Shoulder abduction    Shoulder adduction    Shoulder internal rotation T7 pain T8  Shoulder external rotation T2 pain T3  Elbow flexion    Elbow extension    Wrist flexion    Wrist extension    Wrist ulnar deviation    Wrist radial deviation    Wrist pronation    Wrist supination    (Blank rows = not tested) C elevation pain increases at 80d  and increases UPPER EXTREMITY MMT:  MMT Right 11/20/2022 Left 11/20/2022  Shoulder flexion 120 pain 120  Shoulder extension    Shoulder abduction    Shoulder adduction    Shoulder internal rotation    Shoulder external rotation T2 T3  Middle trapezius    Lower trapezius    Elbow flexion    Elbow extension    Wrist flexion    Wrist extension    Wrist ulnar deviation    Wrist radial deviation    Wrist pronation    Wrist supination    Grip strength (lbs)    (Blank rows = not tested)  SHOULDER SPECIAL TESTS:  Impingement tests: Hawkins/Kennedy impingement test: positive   Instability tests:  Negative  Rotator cuff assessment: Empty can test: negative and Full can test: negative  Both negative for overt weakness, min provocation of pain.  Biceps assessment: Yergason's test: negative   No overt weakness JOINT MOBILITY TESTING:  WNLs  PALPATION:  TTP for the superior [place] area and upper trap    TODAY'S TREATMENT: [facility] Adult PT Treatment:                                                DATE: 11/20/22 Therapeutic Exercise: Cervical traction x10 5" Upper trap stretch x3 15" Cervical rotation stretch x3 15" Shoulder shrug and retractions Manual Therapy: STM to the L upper trap Skilled palpation to identifiy TrPs and taut muscle bands  Trigger Point Dry Needling Treatment: Pre-treatment instruction: Patient instructed on dry needling rationale, procedures, and possible side effects including pain during treatment (achy,cramping feeling), bruising, drop of blood, lightheadedness, nausea, sweating. Patient Consent Given: Yes Education handout provided: Yes Muscles treated: L upper trap  Needle size and number: .30x72mm x 1 Electrical stimulation performed: No Parameters: N/A Treatment response/outcome: Twitch response elicited Post-treatment instructions: Patient instructed to expect possible mild to moderate muscle soreness later today and/or tomorrow. Patient instructed in methods  to reduce muscle soreness and to continue prescribed HEP. If patient was dry needled over the lung field, patient was instructed on signs and symptoms of pneumothorax and, however unlikely, to see immediate medical attention should they occur. Patient was also educated on signs and symptoms of infection and to seek medical attention should they occur. Patient verbalized understanding of these instructions and education.  [facility] Adult PT Treatment:                                                DATE: 11/13/22 Therapeutic Exercise: NuStep 5 min L5 UE/LE Bridging 2x10 3 sec Hip abd 2x10 3 sec GTB R SLR c quad set 1x15 3" Heel slide c strap assist x10 10" Heel slide on disc x15 LAQ 2x10 3" 3# Updated HEP   [facility] Adult PT Treatment:                                                DATE: 11/06/22 Therapeutic Exercise: NuStep 5 min L5 UE/LE Bridging 2x10 3 sec Hip abd 2x10 3 sec GTB R SLR c quad set 2x10 3" Heel slide c strap assist x5 30" LAQ 2x10 3" Updated HEP  [facility] Adult PT Treatment:  DATE: 10/25/22 Therapeutic Exercise: LTR x10 10 Supine Piriformis stretch x2 20" Seated neural glide x10 each Seated hamstring stretch x2 20" each Seated ball press for abs 2x10, vc for breathing STS from min elevated bari-mat 2x5 UE/back ext RTB 2x10 Manual Therapy: STM/DTM to the L lumbar paraspinals and QL and to the L gluteal and piriformis muscles Skilled palpation to identify TrPs and taut muscle bands  Trigger Point Dry Needling Treatment: Pre-treatment instruction: Patient instructed on dry needling rationale, procedures, and possible side effects including pain during treatment (achy,cramping feeling), bruising, drop of blood, lightheadedness, nausea, sweating. Patient Consent Given: Yes Education handout provided: Yes Muscles treated: L lower lumbar paraspinals and multifidi; piriformis, glut medius, glut minimus Needle size and number: .30x62mm x 1  and .30x123mm x 1 Electrical stimulation performed: No Parameters: N/A Treatment response/outcome: Twitch response elicited Post-treatment instructions: Patient instructed to expect possible mild to moderate muscle soreness later today and/or tomorrow. Patient instructed in methods to reduce muscle soreness and to continue prescribed HEP. If patient was dry needled over the lung field, patient was instructed on signs and symptoms of pneumothorax and, however unlikely, to see immediate medical attention should they occur. Patient was also educated on signs and symptoms of infection and to seek medical attention should they occur. Patient verbalized understanding of these instructions and education.  PATIENT EDUCATION:  Education details: Eval findings, POC, HEP, self care Person educated: Patient Education method: Explanation, Demonstration, Tactile cues, Verbal cues, and Handouts Education comprehension: verbalized understanding, returned demonstration, verbal cues required, and tactile cues required   HOME EXERCISE PROGRAM: Access Code: [email or web address] URL: [email or web address] Date: 11/20/2022 Prepared by: Gar Ponto  Exercises - Supine Lower Trunk Rotation  - 1-2 x daily - 7 x weekly - 1 sets - 5 reps - 5 hold - Supine Piriformis Stretch with Foot on Ground  - 1-2 x daily - 7 x weekly - 3 reps - 15 hold - Hooklying Single Knee to Chest  - 1-2 x daily - 7 x weekly - 1 sets - 3 reps - 15 hold - Supine Bridge  - 1-2 x daily - 7 x weekly - 1 sets - 10 reps - 3 hold - Hooklying Clamshell with Resistance  - 1 x daily - 7 x weekly - 1 sets - 10 reps - 3 hold - Supine Hip Adduction Isometric with Ball  - 1 x daily - 7 x weekly - 1 sets - 10 reps - 3 hold - Seated Flexion Stretch with Swiss Ball  - 1 x daily - 7 x weekly - 1 sets - 6 reps - 20 hold - Seated Abdominal Press into [employer]  - 1 x daily - 7 x weekly - 1 sets - 10 reps - 3 hold - Standing Hip Flexor Stretch  - 1 x daily -  7 x weekly - 1 sets - 10 reps - 20 hold - Supine Posterior Pelvic Tilt  - 1 x daily - 7 x weekly - 1 sets - 10 reps - 3 hold - Seated March with Resistance  - 1 x daily - 7 x weekly - 3 sets - 10 reps - seated clam with resistance band  - 1 x daily - 7 x weekly - 3 sets - 10 reps - Active Straight Leg Raise with Quad Set  - 1 x daily - 7 x weekly - 2 sets - 10 reps - 3 hold - Seated Long Arc Quad  - 1 x daily - 7  x weekly - 2 sets - 10 reps - 3 hold - Supine Heel Slide with Strap  - 1 x daily - 7 x weekly - 1 sets - 3-5 reps - 30 hold - Seated Cervical Retraction  - 3 x daily - 7 x weekly - 1 sets - 5 reps - 5 hold - Seated Upper Trapezius Stretch (Mirrored)  - 2 x daily - 7 x weekly - 1 sets - 3 reps - 15 hold - Seated Cervical Rotation AROM  - 2 x daily - 7 x weekly - 1 sets - 3 reps - 15 hold - Standing Shoulder Shrug and Retraction with Resistance  - 2 x daily - 7 x weekly - 1 sets - 10 reps - 10 hold   ASSESSMENT:   CLINICAL IMPRESSION: PT session was limited due tpo the pt arriving late. PT was completed for manual therapy for the L upper trap as noted above f/b TPDN. Muscles twitches were elicited. Cervical and upper shoulder flexibility and postural strengthening therex were completed. Pt's HEP was updated to include these therex. Will assess pt's response to the TPDN provided to the L upper trap on her next scheduled visit. Pt continues to demonstrate an improved gait pattern, walking with an improved pace as indication of less pain. Pt will continue to benefit from skilled PT to address impairments for improved function with less pain.  OBJECTIVE IMPAIRMENTS: decreased activity tolerance, decreased balance, diffculity walking, decreased ROM, decreased strength, increased muscle spasms, obesity, and pain.    ACTIVITY LIMITATIONS: carrying, lifting, bending, sitting, standing, squatting, sleeping, stairs, and locomotion level   PARTICIPATION LIMITATIONS: meal prep, cleaning, laundry,  shopping, and community activity   PERSONAL FACTORS: Fitness, Past/current experiences, Time since onset of injury/illness/exacerbation, and 3+ comorbidities:    Mod limited, pulling pain R hip are also affecting patient's functional outcome.    REHAB POTENTIAL: Good   CLINICAL DECISION MAKING: Evolving/moderate complexity   EVALUATION COMPLEXITY: Moderate     GOALS:   SHORT TERM GOALS: Target date: 08/24/22   Pt will be Ind in an initial HEP  Baseline: initiated Status: proper completion Goal status: MET   2.  Pt will voice understanding of measures to assist in pain reduction Baseline: initiated Status: HEP and sleeping positions with support; use of cold pack Goal status: MET   LONG TERM GOALS: Target date: 12/07/22   Pt will be Ind in a final HEP to maintain achieved LOF Baseline: initiated Goal status: ONGOING   2.  Pt's trunk ROM will improve to minimal or less for improved function and as demonstration of decreased pain Baseline: see flow sheets 10/10/22: Lumbar AROM minimally improved and with min pain -see chart Goal status: ONGOING   3.  Pt will report a decreased in pain to 3/10 or less with daily activities Baseline: 6-8/10 Status: 09/11/22= 2/10 10/10/22: 2/10 on average Goal status: MET   4.  Pt L hip will demonstrate ROM and strength equal to that of the R hip for improved function Baseline: pain with all passive movements 10/02/22: PROM equal bilateral with min stretch , left hip weak compared to right Goal status: PARTIALLY MET   5. Improve 5xSTS by MCID of 5" and 2MWT by MCID of 56ft as indication of improved functional mobility  Baseline: TBA STATUS= 09/18/22= 29.2" (low mat, with UE) Goal status: MET for 5xSTS and 2 MWT   6.  Pt's FOTO score will improved to the predicted value of 55% as indication of improved  function  Baseline: 38% 10/05/22: 54% Goal status: Improved   7. Pt will report a decrease in L shoulder pain to 2/10 or less for improved  QOL   Baseline: 5/10 Goal status: Initial  8. Pt will be able to lift 5# from waist height to above shoulder for functional use of her L UE Baseline: NT Goal status: Initial  9. Increase R knee flexion AROM to 105d or greater for functional mobility sitting and negotiating steps Baseline: 95d Goal status: Initial  10.Increase R hip and knee strength to 4+/5  for improved functional mobility Baseline: See flow sheets Goal status: Initial  PLAN:   PT FREQUENCY: 2x/week   PT DURATION: 6 weeks   PLANNED INTERVENTIONS: Therapeutic exercises, Therapeutic activity, Neuromuscular re-education, Balance training, Gait training, Patient/Family education, Self Care, Joint mobilization, Stair training, Aquatic Therapy, Dry Needling, Electrical stimulation, Spinal manipulation, Spinal mobilization, Cryotherapy, Moist heat, Taping, Traction, Ultrasound, Ionotophoresis 4mg /ml Dexamethasone, Manual therapy, and Re-evaluation.   PLAN FOR NEXT SESSION: ; L hip strength and lumbar flexibility for completion of goals;   Pt has pacemaker, NO IONTO or ESTIM   Orah Sonnen MS, PT 11/20/22 1:54 PM

## 2022-11-22 ENCOUNTER — Other Ambulatory Visit: Payer: Self-pay | Admitting: Internal Medicine

## 2022-11-22 ENCOUNTER — Ambulatory Visit: Payer: Medicare Other | Admitting: Physical Therapy

## 2022-11-22 ENCOUNTER — Ambulatory Visit (INDEPENDENT_AMBULATORY_CARE_PROVIDER_SITE_OTHER): Payer: Medicare Other | Admitting: Internal Medicine

## 2022-11-22 ENCOUNTER — Encounter: Payer: Self-pay | Admitting: Internal Medicine

## 2022-11-22 ENCOUNTER — Encounter: Payer: Self-pay | Admitting: Physical Therapy

## 2022-11-22 VITALS — BP 126/84 | HR 80 | Temp 97.8°F | Ht 66.5 in | Wt 220.2 lb

## 2022-11-22 DIAGNOSIS — J849 Interstitial pulmonary disease, unspecified: Secondary | ICD-10-CM

## 2022-11-22 DIAGNOSIS — M25512 Pain in left shoulder: Secondary | ICD-10-CM | POA: Diagnosis not present

## 2022-11-22 DIAGNOSIS — R0609 Other forms of dyspnea: Secondary | ICD-10-CM | POA: Diagnosis not present

## 2022-11-22 DIAGNOSIS — M25552 Pain in left hip: Secondary | ICD-10-CM

## 2022-11-22 DIAGNOSIS — G8929 Other chronic pain: Secondary | ICD-10-CM

## 2022-11-22 DIAGNOSIS — M6281 Muscle weakness (generalized): Secondary | ICD-10-CM

## 2022-11-22 MED ORDER — DILTIAZEM HCL ER COATED BEADS 120 MG PO CP24: 120.0000 mg | ORAL_CAPSULE | Freq: Every day | ORAL | 3 refills | Status: DC

## 2022-11-22 NOTE — Patient Instructions (Addendum)
    ICD-10-CM   1. ILD (interstitial lung disease)  J84.9     2. Dyspnea on exertion  R06.09        PFT shows 9% decline over 4 years. CT scan has changed 2013/2020 -> Nov 2023 ad current CT Nov 2023 more consistent with interstitial lung disease/pulmonary fibrosis.  Based on age greater than 60, bilateral lower lobe crackles, slow progression, normal blood work findings on CT scan I think the specific variety of pulmonary fibrosis IPF otherwise call idiopathic pulmonary fibrosis   PLAN  - will discuss in case confernce May 2024  - holding off on walk test 08/23/2022  and 11/22/2022 due to right knee issue  -Check liver function test today ahead of starting pirfenidone  -Start pirfenidone per protocol    -Discussed benefits, risks and other option of nintedanib in detail    Followup  -6 weeks video visit or face-to-face visit with Dr. Chase Caller or nurse practitioner to review uptake with pirfenidone

## 2022-11-22 NOTE — Progress Notes (Signed)
OV 08/23/2022 -refer by Dr. Halford Chessman to Dr. Chase Caller at the pulmonary fibrosis center.  Subjective:  Patient ID: [name], female , DOB: 08/16/1951 , age 72 y.o. , MRN: [id number] , ADDRESS: [place] [id number] PCP Dorna Mai, MD Patient Care Team: Dorna Mai, MD as PCP - General (Family Medicine) Deboraha Sprang, MD as PCP - Electrophysiology (Cardiology) Jettie Booze, MD as PCP - Cardiology (Cardiology) Jovita Kussmaul, MD as Consulting Physician (General Surgery) Truitt Merle, MD as Consulting Physician (Hematology) Thea Silversmith, MD as Consulting Physician (Radiation Oncology) Rockwell Germany, RN as Registered Nurse Mauro Kaufmann, RN as Registered Nurse Deboraha Sprang, MD as Consulting Physician (Cardiology) Holley Bouche, NP (Inactive) as Nurse Practitioner (Nurse Practitioner) Sylvan Cheese, NP as Nurse Practitioner (Nurse Practitioner) Chesley Mires, MD as Consulting Physician (Pulmonary Disease) Jovita Kussmaul, MD as Consulting Physician (General Surgery) Bo Merino, MD as Consulting Physician (Rheumatology)  This Provider for this visit: Treatment Team:  Attending Provider: Brand Males, MD    08/23/2022 -   Chief Complaint  Patient presents with   Consult    Referred for ILD, states DOE     HPI [name] 72 y.o. -referred by Dr. Halford Chessman to Dr. Chase Caller the fibrosis center.  History is gained by review of the chart, talking to the patient in the ILD questionnaire that the patient fell.  There is no one accompanying the patient.  She has been following with Dr. Halford Chessman at least for the last 3 to 4 years for a diagnosis of bronchiectasis.  She says at baseline she is doing fairly well but then in October 2022 she suffered from COVID-19 and then was hospitalized.  Review of the records indicate that she was hospitalized for total knee replacement of the right knee early August 2022 for 1  day.  Then a few weeks later on 06/03/2021 she was hospitalized for worsening respiratory failure which she believes was because of COVID-19 but review the records indicate that she actually had COVID in September 2022 and was treated with Paxlovid as an outpatient.  At this hospitalization the COVID was negative.  She was actually hypoxemic.  Pulmonary embolism was ruled out.  This admission RVP and COVID were normal.  Procalcitonin was normal.  Final diagnosis was acute on chronic hypoxemic respiratory failure due to bronchiectasis and pulmonary fibrosis.  She says since then she has been gradually weaned of oxygen.  She says overall she is better since the hospitalization October 2022 but she is not back to her baseline from before the hospitalization.  She has dyspnea on exertion.  Most recently November 2023 Dr. Halford Chessman did high-resolution CT scan of the chest that is concerning for honeycombing findings and progressive changes [personally visualized and I do not fully agree].  Therefore she has been referred here.  Pulmonary function testing shows still blood in DLCO but the FVC seems reduced recently.   All the question is as follows  Most recently she is use 2 L of oxygen with exertion.   [place] [facility] Comprehensive ILD Questionnaire  Symptoms:  Other than dyspnea she also has a cough.  The cough is a since December 2023.  Although currently it is not there.   Past Medical History :  -She marked positive for COPD according to history but does not not be correct. -She is have OSA for which she is previous Dr. Halford Chessman - She does have  a longstanding history of acid reflux/hiatal hernia No history of tuberculosis -She has a history of breast cancer with radiation to the right breast in 2018 -Per the records she suffers from chronic pain syndrome with a history of right brain aneurysm clipping 2001 and cervical stenosis. -She has acid reflux disease for which she sees Dr. Hilarie Fredrickson -She has  overactive bladder -She has nonischemic cardiomyopathy for which she sees Dr. Caryl Comes  -EF 35-40% in 2013 but as high as 50-55% in December 2022   ROS:  She has longstanding chronic pain and gait abnormality  FAMILY HISTORY of LUNG DISEASE:  She has arthralgia - She does have dry eyes -She does have heartburn -Denies family history of lung disease  PERSONAL EXPOSURE HISTORY:  -Denies family history of lung disease -She smoked between 1966 in 53 1/2 pack/day.  No marijuana no cocaine no vaping  HOME  EXPOSURE and HOBBY DETAILS :  -Her current home was built in 1973 and she has been in this house since the last 25 years.  She does use a CPAP mask.  She does use a nebulizer machine.  She does do some occasional gardening but otherwise detailed organic antigen history is negative.  OCCUPATIONAL HISTORY (122 questions) : She is to work for [employer] and then got disabled and then retired.  Detail organic and inorganic antigen history exposure at work is negative.  PULMONARY TOXICITY HISTORY (27 items):  -Did get rid Dacian to the right breast in 2018  INVESTIGATIONS: -     CT Chest data - HRCT Nov 2023 -personally visualized.  Not fully sure if that is actually UIP or not.  If that is UIP then that negative prognostic marker.  I am also not sure about progression.  With this allowed to be clarified with multidisciplinary case conference.   Narrative & Impression  CLINICAL DATA:  72 year old female with history of shortness of breath. Evaluate for interstitial lung disease.   EXAM: CT CHEST WITHOUT CONTRAST   TECHNIQUE: Multidetector CT imaging of the chest was performed following the standard protocol without intravenous contrast. High resolution imaging of the lungs, as well as inspiratory and expiratory imaging, was performed.   RADIATION DOSE REDUCTION: This exam was performed according to the departmental dose-optimization program which includes automated exposure  control, adjustment of the mA and/or kV according to patient size and/or use of iterative reconstruction technique.   COMPARISON:  CTA of the chest 08/30/2021.   FINDINGS: Cardiovascular: Heart size is normal. There is no significant pericardial fluid, thickening or pericardial calcification. There is aortic atherosclerosis, as well as atherosclerosis of the great vessels of the mediastinum and the coronary arteries, including calcified atherosclerotic plaque in the left anterior descending coronary artery. Left-sided pacemaker/AICD device in place with lead tip terminating in the right ventricular apex.   Mediastinum/Nodes: No pathologically enlarged mediastinal or hilar lymph nodes. Please note that accurate exclusion of hilar adenopathy is limited on noncontrast CT scans. Esophagus is unremarkable in appearance. No axillary lymphadenopathy.   Lungs/Pleura: High-resolution images demonstrate widespread but patchy areas of ground-glass attenuation, septal thickening, subpleural reticulation, parenchymal banding, traction bronchiectasis, peripheral bronchiolectasis and extensive honeycombing. These findings have a definitive craniocaudal gradient and are progressive compared to the prior examination from 08/30/2021. Inspiratory and expiratory imaging is unremarkable. No acute confluent consolidative airspace disease. No pleural effusions. No definite suspicious appearing pulmonary nodules or masses are noted.   Upper Abdomen: Aortic atherosclerosis. Colonic diverticulosis noted in the splenic flexure of the colon.  Musculoskeletal: There are no aggressive appearing lytic or blastic lesions noted in the visualized portions of the skeleton.   IMPRESSION: 1. 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 2. Aortic atherosclerosis, in addition to left  anterior descending coronary artery disease. Assessment for potential risk factor modification, dietary therapy or pharmacologic therapy may be warranted, if clinically indicated. 3. Colonic diverticulosis without evidence of acute diverticulitis at this time.   Aortic Atherosclerosis (ICD10-I70.0).     Electronically Signed   By: Vinnie Langton M.D.   On: 07/08/2022 10:54     OV 11/22/2022  Subjective:  Patient ID: [name], female , DOB: Jan 05, 1951 , age 50 y.o. , MRN: [id number] , ADDRESS: [place] [id number] PCP Dorna Mai, MD Patient Care Team: Dorna Mai, MD as PCP - General (Family Medicine) Deboraha Sprang, MD as PCP - Electrophysiology (Cardiology) Jettie Booze, MD as PCP - Cardiology (Cardiology) Jovita Kussmaul, MD as Consulting Physician (General Surgery) Truitt Merle, MD as Consulting Physician (Hematology) Thea Silversmith, MD as Consulting Physician (Radiation Oncology) Rockwell Germany, RN as Registered Nurse Mauro Kaufmann, RN as Registered Nurse Deboraha Sprang, MD as Consulting Physician (Cardiology) Holley Bouche, NP (Inactive) as Nurse Practitioner (Nurse Practitioner) Sylvan Cheese, NP as Nurse Practitioner (Nurse Practitioner) Chesley Mires, MD as Consulting Physician (Pulmonary Disease) Jovita Kussmaul, MD as Consulting Physician (General Surgery) Bo Merino, MD as Consulting Physician (Rheumatology)  This Provider for this visit: Treatment Team:  Attending Provider: Brand Males, MD    11/22/2022 -   Chief Complaint  Patient presents with   Follow-up    F/u pft 11/15/22. Labs,      HPI Cristan Tasch 72 y.o. -returns for follow-up.  This is potential ILD workup in progress.  She states in the interim her son who is age 91 did have a heart transplant for congestive heart failure 3 weeks ago at [facility] he is home and is improving.  She is happy about that.  She  also reports that after her COVID she was prescribed Symbicort she is run out of the Symbicort for the last 2 weeks but this is not really bothering her.  She feels stable she has occasional cough.  Be here to review the results.  She had pulmonary function test that shows now a consistent pattern of decline her FVC is declined 9% in the last 4 years.  I went back and visualized all her CT scans of the chest.  Her CT scan in 2013 and a high-resolution CT chest in 2020 look about similar with some bronchiectasis.  However there is dramatic change between 2020 and also the high-resolution CT chest and late 2023.  This seems a lot more traction bronchiectasis.  There is definite craniocaudal gradient.  She has crackles here.  Initially I was not sure if this was classic UIP but clinically this makes sense especially of the lower lobe crackles.  Was supposed to discuss this in the case conference but this did not happen.  Every posted for discussion of the case conference but nevertheless his decline in PFTs and also this change now in the CT scan of the chest.  In the interim his serologies are normal.  I think at the least she has some plan for progressive ILD .  Possible is IPF but I would like to confirm this and discussion  of the case conference.  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  Went over the 2 antifibrotic's nintedanib and pirfenidone.  Shows pirfenidone because of the diarrhea side effects with nintedanib.  She has knee pain and be hard to get to the toilet.  In addition she has nonischemic cardiomyopathy and this blackbox warning for MI risk with with nintedanib.  [Even though she does not have ischemic cardiomyopathy].  Told her that pirfenidone associated more with weight loss nausea anorexia.  Both drugs required liver function monitoring.  Generally both drugs have reversible side effects.  In balance which was  pirfenidone.  I did note mild transaminitis in the past will check liver function test again today.    SYMPTOM SCALE - ILD 08/23/2022 11/22/2022   Current weight    O2 use x   Shortness of Breath 0 -> 5 scale with 5 being worst (score 6 If unable to do)   At rest 0   Simple tasks - showers, clothes change, eating, shaving 2   Household (dishes, doing bed, laundry) 1   Shopping 1   Walking level at own pace 2   Walking up Stairs 3   Total (30-36) Dyspnea Score 9       Non-dyspnea symptoms (0-> 5 scale) 08/23/2022   How bad is your cough? 0   How bad is your fatigue 0   How bad is nausea 00   How bad is vomiting?  0   How bad is diarrhea? 0   How bad is anxiety? 2   How bad is depression 2   Any chronic pain - if so where and how bad 1    Simple office walk 185 feet x  3 laps goal with forehead probe 11/22/2022 Could not do due to Rt Knee pain   O2 used x   Number laps completed x   Comments about pace x   Resting Pulse Ox/HR x% and x/min   Final Pulse Ox/HR x% and x/min   Desaturated </= 88% x   Desaturated <= 3% points x   Got Tachycardic >/= 90/min x   Symptoms at end of test x   Miscellaneous comments xx      PFT     Latest Ref Rng & Units 08/24/2022    1:55 PM 06/08/2022    3:49 PM 10/16/2018   12:55 PM 01/17/2017    2:50 PM  PFT Results  FVC-Pre L 2.30  2.33  2.52  2.63   FVC-Predicted Pre % 72  73  96  98   FVC-Post L  2.36  2.49  2.52   FVC-Predicted Post %  74  94  94   Pre FEV1/FVC % % 92  93  90  90   Post FEV1/FCV % %  93  91  92   FEV1-Pre L 2.12  2.17  2.27  2.38   FEV1-Predicted Pre % 88  90  111  113   FEV1-Post L  2.20  2.26  2.32   DLCO uncorrected ml/min/mmHg 11.05  12.95  12.43  15.90   DLCO UNC% % 52  62  59  58   DLCO corrected ml/min/mmHg 11.05  12.95   15.80   DLCO COR %Predicted % 52  62   57   DLVA Predicted % 87  93  85  80   TLC L  3.76  4.00  3.85   TLC %  Predicted %  69  74  71   RV % Predicted %  55  58  55     Latest  Reference Range & Units 07/17/22 15:05 08/24/22 13:56  IgE (Immunoglobulin E), Serum 6 - 495 IU/mL 37   Anti Nuclear Antibody (ANA) NEGATIVE  NEGATIVE   ANCA SCREEN Negative  Negative   Cyclic Citrullin Peptide Ab UNITS  <16  RA Latex Turbid. <14.0 IU/mL <10.0   SSA (Ro) (ENA) Antibody, IgG <1.0 NEG AI  <1.0 NEG  SSB (La) (ENA) Antibody, IgG <1.0 NEG AI  <1.0 NEG  Scleroderma (Scl-70) (ENA) Antibody, IgG 0.0 - 0.9 AI <0.2     08/24/22 13:55  QUANTIFERON-TB GOLD PLUS Rpt  Rpt: View report in Results Review for more information  Latest Reference Range & Units 05/30/17 10:49 10/16/18 14:52  Aspergillus Fumigatus IgE Class 0 kU/L <0.10   Aspergillus Fumigatus NEGATIVE   NEGATIVE    Latest Reference Range & Units 04/26/17 14:51 08/24/22 13:56  NT-Pro BNP 0 - 301 pg/mL 89 76    has a past medical history of AICD (automatic cardioverter/defibrillator) present, Anxiety, Arthritis, Back pain, Breast cancer (2016), Bronchiectasis, Cerebral aneurysm, nonruptured, CHF (congestive heart failure), Clostridium difficile infection, Depressive disorder, not elsewhere classified, Diverticulosis of colon (without mention of hemorrhage), Esophageal reflux, Fatty liver, Fibromyalgia, Gastritis, GERD (gastroesophageal reflux disease), GI bleed (2004), Glaucoma, Hiatal hernia, History of COVID-19 (05/04/2021), Hyperlipidemia, Hypertension, Hypothyroidism, Internal hemorrhoids, Joint pain, Multifocal pneumonia (06/03/2021), Obstructive sleep apnea (adult) (pediatric), Osteoarthritis, Ostium secundum type atrial septal defect, Other chronic nonalcoholic liver disease, Other pulmonary embolism and infarction, Palpitations, Paroxysmal ventricular tachycardia, Personal history of radiation therapy, PONV (postoperative nausea and vomiting), Presence of permanent cardiac pacemaker, PUD (peptic ulcer disease), Radiation (02/03/15-03/10/15), Sarcoid, Schatzki's ring, Shortness of breath, Sleep apnea, Stroke (2013), Takotsubo  syndrome, Tubular adenoma of colon, Unspecified transient cerebral ischemia, and Unspecified vitamin D deficiency.   reports that she quit smoking about 33 years ago. Her smoking use included cigarettes. She has a 15.00 pack-year smoking history. She has been exposed to tobacco smoke. She has never used smokeless tobacco.  Past Surgical History:  Procedure Laterality Date   ABI  2006   normal   BREAST EXCISIONAL BIOPSY     BREAST LUMPECTOMY Right 2016   BREAST LUMPECTOMY WITH RADIOACTIVE SEED LOCALIZATION Right 01/03/2015   Procedure: BREAST LUMPECTOMY WITH RADIOACTIVE SEED LOCALIZATION;  Surgeon: Autumn Messing III, MD;  Location: [place];  Service: General;  Laterality: Right;   CARDIAC DEFIBRILLATOR PLACEMENT  2006; 2012   BSX single chamber ICD   carotid dopplers  2006   neg   CEREBRAL ANEURYSM REPAIR  02/1999   COLONOSCOPY     HAMMER TOE SURGERY  11/15/2020   3rd digit bilateral feet    hospitalization  2004   GI bleed, PUD, diverticulosis (EGD,colonscopy)   hospitalization     PE, NSVT, s/p defib   KNEE ARTHROSCOPY     bilateral   LEFT HEART CATHETERIZATION WITH CORONARY ANGIOGRAM N/A 02/22/2012   Procedure: LEFT HEART CATHETERIZATION WITH CORONARY ANGIOGRAM;  Surgeon: Burnell Blanks, MD;  Location: [facility] CATH [facility];  Service: Cardiovascular;  Laterality: N/A;   PARTIAL HYSTERECTOMY     Fibroids   TONGUE BIOPSY  12/12/2017   due to sore tongue and white patches/abnormal cells   TOTAL KNEE ARTHROPLASTY Left 02/13/2016   Procedure: TOTAL KNEE ARTHROPLASTY;  Surgeon: Vickey Huger, MD;  Location: [place];  Service: Orthopedics;  Laterality: Left;   TOTAL KNEE  ARTHROPLASTY Right 05/22/2021   Procedure: TOTAL KNEE ARTHROPLASTY;  Surgeon: Vickey Huger, MD;  Location: [facility];  Service: Orthopedics;  Laterality: Right;  with block   UPPER GASTROINTESTINAL ENDOSCOPY      Allergies  Allergen Reactions   Ace Inhibitors Swelling    Angioedema; makes tongue "break out"    Amitriptyline  Hcl Other (See Comments)     makes her too sleepy!   Atorvastatin Other (See Comments)    SEVERE MYALGIA   Dilantin [Phenytoin Sodium Extended] Rash    Severe rash   Duloxetine Hcl Nausea Only and Other (See Comments)    Sleepiness/ sick  Other Reaction(s): GI Intolerance   Paroxetine Nausea Only    Rapid heartbeat   Ramipril Other (See Comments)    TONGUE ULCERS    Rosuvastatin Other (See Comments)    SEVERE MYALGIA   Carbamazepine Rash   Codeine Itching   Paxlovid [Nirmatrelvir-Ritonavir] Itching   Phenytoin Sodium Extended Rash   Simvastatin Other (See Comments)    Increase in CK, myalgias   Wellbutrin [Bupropion] Palpitations    Immunization History  Administered Date(s) Administered   Fluad Quad(high Dose 65+) 04/17/2019, 04/30/2020   H1N1 07/24/2008   Influenza Split 07/17/2011, 09/27/2011, 04/29/2012   Influenza Whole 07/20/2004, 07/04/2007, 05/07/2008, 05/19/2009, 05/17/2010   Influenza, High Dose Seasonal PF 05/30/2017, 05/21/2018   Influenza,inj,Quad PF,6+ Mos 05/20/2013, 05/06/2014, 05/17/2015, 05/15/2016, 04/27/2021   Influenza-Unspecified 05/07/2018   PFIZER(Purple Top)SARS-COV-2 Vaccination 09/25/2019, 10/16/2019, 06/04/2020, 01/04/2021, 08/06/2021   Pfizer Covid-19 Vaccine Bivalent Booster 54yrs & up 05/07/2022   Pneumococcal Conjugate-13 11/22/2015, 08/06/2021   Pneumococcal Polysaccharide-23 05/20/2002, 05/07/2008, 06/25/2014   Respiratory Syncytial Virus Vaccine,Recomb Aduvanted(Arexvy) 06/26/2022   Td 09/07/2009   Zoster, Live 09/21/2011    Family History  Problem Relation Age of Onset   Diabetes Mother    Alzheimer's disease Mother    Hypertension Mother    Obesity Mother    Heart disease Father    Seizures Sister    Allergies Sister    Heart disease Sister    Other Brother        Thyroid problem 10/2016   Heart disease Brother    Prostate cancer Brother 54       same brother as throat cancer   Throat cancer Brother        dx in his 72s;  also a smoker   Cancer Maternal Grandmother 73       colon cancer or abdominal cancer   Lung cancer Maternal Grandfather 78   Heart disease Son        Cardiac Arrest 07/2016   Breast cancer Maternal Aunt 55   Colon cancer Maternal Aunt 61       same sister as breast at 44   Breast cancer Maternal Aunt        dx in her 90s   Lung cancer Maternal Uncle    Pancreatic cancer Cousin 22       maternal first cousin   Breast cancer Cousin        paternal first cousin twice removed died in her 21s   Stroke Neg Hx      Current Outpatient Medications:    acetaminophen (TYLENOL) 500 MG tablet, Take 500 mg by mouth every 6 (six) hours as needed for moderate pain., Disp: , Rfl:    acetaminophen-codeine (TYLENOL/CODEINE #3) 300-30 MG tablet, Take 1 tablet by mouth every 8 (eight) hours as needed for moderate pain., Disp: 90 tablet, Rfl: 3   albuterol (  PROVENTIL) (2.5 MG/3ML) 0.083% nebulizer solution, USE 1 VIAL IN NEBULIZER EVERY 6 HOURS (Patient taking differently: Take 2.5 mg by nebulization every 6 (six) hours as needed for shortness of breath.), Disp: 120 mL, Rfl: 11   albuterol (VENTOLIN HFA) 108 (90 Base) MCG/ACT inhaler, Inhale 2 puffs into the lungs every 6 (six) hours as needed for wheezing or shortness of breath., Disp: 18 g, Rfl: 3   Ascorbic Acid (VITAMIN C) 1000 MG tablet, Take 1,000 mg by mouth daily., Disp: , Rfl:    aspirin EC 81 MG tablet, Take 81 mg by mouth daily. Swallow whole., Disp: , Rfl:    azelastine (ASTELIN) 0.1 % nasal spray, Place 1 spray into both nostrils 2 (two) times daily. Use in each nostril as directed, Disp: 90 mL, Rfl: 3   budesonide-formoterol (SYMBICORT) 80-4.5 MCG/ACT inhaler, Inhale 1 puff into the lungs 2 (two) times daily., Disp: 1 each, Rfl: 12   cefUROXime (CEFTIN) 500 MG tablet, Take 1 tablet (500 mg total) by mouth 2 (two) times daily with a meal., Disp: 14 tablet, Rfl: 0   cetirizine (ZYRTEC) 10 MG tablet, Take 10 mg by mouth daily as needed for  allergies., Disp: , Rfl:    Cholecalciferol (DIALYVITE VITAMIN D 5000) 125 MCG (5000 UT) capsule, Take 5,000 Units by mouth daily., Disp: , Rfl:    cloBAZam (ONFI) 10 MG tablet, Take 1 tablet (10 mg total) by mouth 2 (two) times daily., Disp: 60 tablet, Rfl: 5   diltiazem (CARDIZEM CD) 120 MG 24 hr capsule, Take 1 capsule (120 mg total) by mouth daily., Disp: 90 capsule, Rfl: 3   empagliflozin (JARDIANCE) 10 MG TABS tablet, Take 1 tablet (10 mg total) by mouth daily., Disp: 90 tablet, Rfl: 3   Evolocumab (REPATHA SURECLICK) [id number] MG/ML SOAJ, Inject 140 mg into the skin every 14 (fourteen) days., Disp: 6 mL, Rfl: 3   famotidine (PEPCID) 40 MG tablet, Take 1 tablet (40 mg total) by mouth at bedtime., Disp: 360 tablet, Rfl: 6   fexofenadine (ALLEGRA) 180 MG tablet, Take 180 mg by mouth daily as needed for allergies (alternates with zyrtec sometimes)., Disp: , Rfl:    fluticasone (CUTIVATE) 0.05 % cream, Apply topically 2 (two) times daily., Disp: 60 g, Rfl: 2   fluticasone (FLONASE) 50 MCG/ACT nasal spray, Place 2 sprays into both nostrils daily as needed for allergies or rhinitis., Disp: 48 g, Rfl: 3   FOLIC ACID PO, Take 1 tablet by mouth daily., Disp: , Rfl:    furosemide (LASIX) 20 MG tablet, TAKE 2 TABLETS (40 MG) 3 DAYS PER WEEK AND TAKE 1 TABLET (20 MG) THE OTHER DAYS OF THE WEEK, Disp: 130 tablet, Rfl: 1   gabapentin (NEURONTIN) 100 MG capsule, Take 1 capsule (100 mg total) by mouth 3 (three) times daily., Disp: 90 capsule, Rfl: 6   gabapentin (NEURONTIN) 300 MG capsule, Take 1 capsule (300 mg total) by mouth at bedtime., Disp: 30 capsule, Rfl: 2   hydroxypropyl methylcellulose / hypromellose (ISOPTO TEARS / GONIOVISC) 2.5 % ophthalmic solution, Place 1 drop into both eyes at bedtime., Disp: , Rfl:    latanoprost (XALATAN) 0.005 % ophthalmic solution, Place 1 drop into both eyes at bedtime., Disp: , Rfl:    levETIRAcetam (KEPPRA) 500 MG tablet, Take 1 tablet (500 mg total) by mouth 2 (two) times  daily., Disp: 180 tablet, Rfl: 1   levothyroxine (SYNTHROID) 50 MCG tablet, TAKE 1 TABLET EVERY DAY, Disp: 90 tablet, Rfl: 0   magnesium oxide (  MAG-OX) 400 MG tablet, Take 1 tablet (400 mg total) by mouth 2 (two) times daily., Disp: 180 tablet, Rfl: 3   mometasone (ELOCON) 0.1 % lotion, Apply 1 application topically daily as needed (rash)., Disp: 60 mL, Rfl: 1   montelukast (SINGULAIR) 10 MG tablet, TAKE 1 TABLET AT BEDTIME, Disp: 90 tablet, Rfl: 3   Multiple Vitamins-Minerals (MULTIVITAMIN PO), Take 1 tablet by mouth daily., Disp: , Rfl:    nitroGLYCERIN (NITROSTAT) 0.4 MG SL tablet, DISSOLVE ONE TABLET UNDER THE TONGUE EVERY 5 MINUTES AS NEEDED FOR CHEST PAIN.  DO NOT EXCEED A TOTAL OF 3 DOSES IN 15 MINUTES (Patient taking differently: Place 0.4 mg under the tongue every 5 (five) minutes as needed for chest pain.), Disp: 25 tablet, Rfl: 0   nystatin (MYCOSTATIN) 100000 UNIT/ML suspension, Take 5 mLs (500,000 Units total) by mouth 4 (four) times daily., Disp: 60 mL, Rfl: 0   pantoprazole (PROTONIX) 40 MG tablet, TAKE 1 TABLET EVERY DAY, Disp: 90 tablet, Rfl: 3   polyethylene glycol powder (GLYCOLAX/MIRALAX) powder, Take 17 grams by mouth daily, Disp: 1080 g, Rfl: 0   potassium chloride (KLOR-CON M) 10 MEQ tablet, TAKE 1/2 TABLET EVERY DAY (CALL OFFICE TO SCHEDULE YEARLY APPT), Disp: 45 tablet, Rfl: 3   Respiratory Therapy Supplies (FLUTTER) DEVI, 1 Device by Does not apply route as needed., Disp: 1 each, Rfl: 0   sodium chloride HYPERTONIC 3 % nebulizer solution, Take by nebulization 2 (two) times daily as needed for cough. 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, Disp: 750 mL, Rfl: 12   tiZANidine (ZANAFLEX) 4 MG tablet, Take 0.5-1 tablets (2-4 mg total) by mouth every 8 (eight) hours as needed for muscle spasms., Disp: 90 tablet, Rfl: 5   traZODone (DESYREL) 100 MG tablet, TAKE 1 TO 1 AND 1/2 TABLETS AT BEDTIME, Disp: 135 tablet, Rfl: 1   triamcinolone cream (KENALOG) 0.1 %, Apply 1 Application topically 2 (two)  times daily., Disp: 453.6 g, Rfl: 0  Current Facility-Administered Medications:    lidocaine (XYLOCAINE) 1 % (with pres) injection 1 mL, 1 mL, Other, Once, Lovorn, Megan, MD   lidocaine (XYLOCAINE) 1 % (with pres) injection 3 mL, 3 mL, Other, Once, Lovorn, Megan, MD   triamcinolone acetonide (KENALOG-40) injection 40 mg, 40 mg, Intramuscular, Once, Lovorn, Megan, MD      Objective:   Vitals:   11/22/22 1435  BP: 126/84  Pulse: 80  Temp: 97.8 F (36.6 C)  TempSrc: Oral  SpO2: 97%  Weight: 220 lb 3.2 oz (99.9 kg)  Height: 5' 6.5" (1.689 m)    Estimated body mass index is 35.01 kg/m as calculated from the following:   Height as of this encounter: 5' 6.5" (1.689 m).   Weight as of this encounter: 220 lb 3.2 oz (99.9 kg).  @WEIGHTCHANGE @  [employer]   11/22/22 1435  Weight: 220 lb 3.2 oz (99.9 kg)     Physical Exam  General: No distress. Obese Rt KNEE in ICE Neuro: Alert and Oriented x 3. GCS 15. Speech normal Psych: Pleasant Resp:  Barrel Chest - no.  Wheeze - no, Crackles - YES, No overt respiratory distress CVS: Normal heart sounds. Murmurs - no Ext: Stigmata of Connective Tissue Disease - No HEENT: Normal upper airway. PEERL +. No post nasal drip        Assessment:       ICD-10-CM   1. ILD (interstitial lung disease)  J84.9     2. Dyspnea on exertion  R06.09  Plan:     Patient Instructions     ICD-10-CM   1. ILD (interstitial lung disease)  J84.9     2. Dyspnea on exertion  R06.09        PFT shows 9% decline over 4 years. CT scan has changed 2013/2020 -> Nov 2023 ad current CT Nov 2023 more consistent with interstitial lung disease/pulmonary fibrosis.  Based on age greater than 60, bilateral lower lobe crackles, slow progression, normal blood work findings on CT scan I think the specific variety of pulmonary fibrosis IPF otherwise call idiopathic pulmonary fibrosis   PLAN  - will discuss in case confernce May 2024  - holding off on  walk test 08/23/2022  and 11/22/2022 due to right knee issue  -Check liver function test today ahead of starting pirfenidone  -Start pirfenidone per protocol    -Discussed benefits, risks and other option of nintedanib in detail    Followup  -6 weeks video visit or face-to-face visit with Dr. Chase Caller or nurse practitioner to review uptake with pirfenidone  ( Level 05 visit: Estb 40-54 min in  visit type: on-site physical face to visit  in total care time and counseling or/and coordination of care by this undersigned MD - Dr Brand Males. This includes one or more of the following on this same day 11/22/2022: pre-charting, chart review, note writing, documentation discussion of test results, diagnostic or treatment recommendations, prognosis, risks and benefits of management options, instructions, education, compliance or risk-factor reduction. It excludes time spent by the [facility] or office staff in the care of the patient. Actual time 87 min)   SIGNATURE    Dr. Brand Males, M.D., F.C.C.P,  Pulmonary and Critical Care Medicine Staff Physician, [place] Director - Interstitial Lung Disease  Program  [facility] [place], [place], [address]  Pager: [phone], If no answer or between  15:00h - 7:00h: call [phone] Telephone: [phone]  3:28 PM 11/22/2022

## 2022-11-22 NOTE — Therapy (Signed)
OUTPATIENT PHYSICAL THERAPY TREATMENT NOTE   Patient Name: [name] MRN: [id number] DOB:21-Oct-1950, 72 y.o., female Today's Date: 11/22/2022  PCP: Camillia Herter, NP   REFERRING PROVIDER: Dorna Mai, MD; Sharyon Medicus, PA-C   See note below for Objective Data and Assessment of Progress/Goals.      END OF SESSION:   PT End of Session - 11/22/22 1319     Visit Number 20    Number of Visits 27    Date for PT Re-Evaluation 12/07/22    Authorization Type MEDICARE PART A AND B    PT Start Time 1317    PT Stop Time [id number]    PT Time Calculation (min) 41 min                 Past Medical History:  Diagnosis Date   AICD (automatic cardioverter/defibrillator) present    Anxiety    Arthritis    Back pain    Breast cancer 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   Bronchiectasis    Cerebral aneurysm, nonruptured    had a clip put in   CHF (congestive heart failure)    Clostridium difficile infection    Depressive disorder, not elsewhere classified    Diverticulosis of colon (without mention of hemorrhage)    Esophageal reflux    Fatty liver    Fibromyalgia    Gastritis    GERD (gastroesophageal reflux disease)    GI bleed 2004   Glaucoma    Hiatal hernia    History of COVID-19 05/04/2021   Hyperlipidemia    Hypertension    Hypothyroidism    Internal hemorrhoids    Joint pain    Multifocal pneumonia 06/03/2021   Obstructive sleep apnea (adult) (pediatric)    Osteoarthritis    Ostium secundum type atrial septal defect    Other chronic nonalcoholic liver disease    Other pulmonary embolism and infarction    Palpitations    Paroxysmal ventricular tachycardia    Personal history of radiation therapy    PONV (postoperative nausea and vomiting)    Presence of permanent cardiac pacemaker    PUD (peptic ulcer disease)    Radiation 02/03/15-03/10/15   Right Breast   Sarcoid    per pt , not sure   Schatzki's ring    Shortness of breath     Sleep apnea    Stroke 2013   tia/ pt feels it was around 2008 0r 2009   Takotsubo syndrome    Tubular adenoma of colon    Unspecified transient cerebral ischemia    Unspecified vitamin D deficiency    Past Surgical History:  Procedure Laterality Date   ABI  2006   normal   BREAST EXCISIONAL BIOPSY     BREAST LUMPECTOMY Right 2016   BREAST LUMPECTOMY WITH RADIOACTIVE SEED LOCALIZATION Right 01/03/2015   Procedure: BREAST LUMPECTOMY WITH RADIOACTIVE SEED LOCALIZATION;  Surgeon: Autumn Messing III, MD;  Location: [facility];  Service: General;  Laterality: Right;   CARDIAC DEFIBRILLATOR PLACEMENT  2006; 2012   BSX single chamber ICD   carotid dopplers  2006   neg   CEREBRAL ANEURYSM REPAIR  02/1999   COLONOSCOPY     HAMMER TOE SURGERY  11/15/2020   3rd digit bilateral feet    hospitalization  2004   GI bleed, PUD, diverticulosis (EGD,colonscopy)   hospitalization     PE, NSVT, s/p defib   KNEE ARTHROSCOPY     bilateral  LEFT HEART CATHETERIZATION WITH CORONARY ANGIOGRAM N/A 02/22/2012   Procedure: LEFT HEART CATHETERIZATION WITH CORONARY ANGIOGRAM;  Surgeon: Burnell Blanks, MD;  Location: [facility] CATH LAB;  Service: Cardiovascular;  Laterality: N/A;   PARTIAL HYSTERECTOMY     Fibroids   TONGUE BIOPSY  12/12/2017   due to sore tongue and white patches/abnormal cells   TOTAL KNEE ARTHROPLASTY Left 02/13/2016   Procedure: TOTAL KNEE ARTHROPLASTY;  Surgeon: Vickey Huger, MD;  Location: [facility];  Service: Orthopedics;  Laterality: Left;   TOTAL KNEE ARTHROPLASTY Right 05/22/2021   Procedure: TOTAL KNEE ARTHROPLASTY;  Surgeon: Vickey Huger, MD;  Location: [facility];  Service: Orthopedics;  Laterality: Right;  with block   UPPER GASTROINTESTINAL ENDOSCOPY     Patient Active Problem List   Diagnosis Date Noted   Bicipital tendinitis of left shoulder 11/09/2022   Partial symptomatic epilepsy with complex partial seizures, not intractable, without status epilepticus 08/10/2022   Low back pain  08/10/2022   Gait abnormality 05/04/2022   Muscle spasm 05/04/2022   Upper airway cough syndrome 01/19/2022   Chronic left shoulder pain 11/13/2021   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   Localized swelling of right lower leg 08/29/2021   Tonsillar exudate 08/29/2021   Candida vaginitis 08/28/2021   Chronic pain syndrome 08/25/2021   Multifocal pneumonia 06/03/2021   Pre-operative respiratory examination 05/18/2021   Statin myopathy 04/19/2021   Pain in joint of right shoulder 02/10/2021   Arthritis of both acromioclavicular joints 01/02/2021   Trochanteric bursitis of right hip 10/03/2020   Tailbone injury 05/05/2020   UTI (urinary tract infection) 05/05/2020   Insomnia 05/05/2020   Leg cramps 02/16/2020   Dysuria 02/16/2020   Right hip impingement syndrome 02/01/2020   Right bicipital tenosynovitis 02/01/2020   Bronchiectasis without complication 0000000   Myofascial pain 12/21/2019   Estrogen deficiency 12/10/2019   Obesity (BMI 30-39.9) 12/10/2019   Bronchiectasis with (acute) exacerbation 10/23/2019   Elevated LFTs 10/15/2019   Lower abdominal pain 09/01/2019   Neck pain 10/25/2017   Paresthesias in left hand 10/25/2017   Paresthesia of foot, bilateral 04/12/2017   Primary osteoarthritis of both hands 09/27/2016   Primary osteoarthritis of both knees 09/27/2016   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   Nasopharyngitis, 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/2016   S/P total knee replacement 02/13/2016   Chronic pain of both knees 12/06/2015   Globus pharyngeus 05/17/2015   Genetic testing 12/23/2014   Family history of breast cancer    Family history of colon cancer    Family history of pancreatic cancer    History of breast cancer 11/23/2014   Allergy to ACE inhibitors 09/27/2014   Prediabetes 06/23/2013   Muscle spasms of both lower extremities 01/15/2013   Hx of Clostridium difficile infection 10/10/2012   Dyspnea on exertion 04/02/2012   NICM (nonischemic cardiomyopathy)  03/07/2012   Post-menopausal 01/11/2012   Atypical chest pain 11/07/2011   Obstructive sleep apnea 04/04/2010   V-tach 01/03/2009   ICD  [employer]  Single chamber 01/03/2009   PFO (patent foramen ovale) 09/29/2008   Vitamin D deficiency 08/17/2008   Hypothyroidism 11/18/2006   Hyperlipidemia 11/18/2006   Depression with anxiety 11/18/2006   Essential hypertension 11/18/2006   Mitral valve prolapse 11/18/2006   Cerebral aneurysm 11/18/2006   Allergic rhinitis 11/18/2006   GERD 11/18/2006   Diverticulosis of colon 11/18/2006   Fatty liver 11/18/2006   Fibromyalgia 11/18/2006    REFERRING DIAG: M54.50 (ICD-10-CM) - Left-sided low back pain without sciatica, unspecified chronicity; M25.552 (ICD-10-CM) - Left  hip pain; Pain in left shoulder; Presence of right artificial knee joint.  THERAPY DIAG:  Chronic left shoulder pain  Chronic pain of right knee  Pain in left hip  Muscle weakness (generalized)  Rationale for Evaluation and Treatment Rehabilitation  PERTINENT HISTORY:  Automatic cardioverter/defibrillator; Bilat knee replacements; arthritis; fibromyalgia; depression, High BMI  PRECAUTIONS: Pacemaker  SUBJECTIVE:                                                                                                                                                                                      SUBJECTIVE STATEMENT:  Pt reports she is continuing to feel better with all areas of pain.  PAIN:  Are you having pain? Yes: NPRS scale: 0/10 for L hip; 0/10 for low back Pain location: L lowback, gluteal, lat hip, groin, and ant thigh Pain description: ache, sharp occasionally, constant Aggravating factors: Walking, sleeping, in/out of car, bending over Relieving factors: Muscle relaxors, heat pad, tylenol On Eval pain range=6-8/10  PAIN:  Are you having pain? Yes: NPRS scale: 1-2/10 Pain location: Superior GH jt and upper trap Pain description: ache,  constant Aggravating factors: Arm movement c dressing and reaching up Relieving factors: Resting  PAIN:  Are you having pain? Yes: NPRS scale: 4/10 Pain location: R knee Pain description: ache, intermittent Aggravating factors: bending the knee, sit to stand Relieving factors: Resting  OBJECTIVE: (objective measures completed at initial evaluation unless otherwise dated)  Pt reports xrays were taken with [facility] yesterday. He was able to recall there was no fracture, proper spacing for the joint   PATIENT SURVEYS:  FOTO: Perceived function   38%, predicted   55%  09/04/22: 54%  10/02/22: 54%   SCREENING FOR RED FLAGS: Bowel or bladder incontinence: No Spinal tumors: No Compression fracture: No   COGNITION: Overall cognitive status: Within functional limits for tasks assessed                          SENSATION: WFL   MUSCLE LENGTH: Hamstrings: Right NT deg; Left NT deg Marcello Moores test: Right NT deg; Left NT deg   POSTURE: increased lumbar lordosis   PALPATION: TTP with increased muscle tightness to the L gluteal region   LUMBAR ROM:    AROM eval 10/10/22  Flexion Mod limited, low back pulling pain Reaches min shins, low back pain  Extension Mod limited, pain low back and L hip Min limited, pain on low back  Right lateral flexion Mod limited, pulling pain R hip Min limited , pain in left hip  Left lateral flexion Mod limited, pain across low back Min limited, min low back  pain   Right rotation Mod limited, pulling pain R hip and across low back WFL min pain  Left rotation Mod limited, pain across low back WFL , min low back    (Blank rows = not tested)   LOWER EXTREMITY ROM:     L decreased in comparison to R  due to pain Passive  Right eval Left eval Left 10/02/22 RT 11/06/22 Rt 11/13/22 Rt  11/22/22  Hip flexion   P WFL     Hip extension   P      Hip abduction   P WFL     Hip adduction          Hip internal rotation   P WFL     Hip external rotation   P WFL      Knee flexion      95d 100d   Knee extension          Ankle dorsiflexion          Ankle plantarflexion          Ankle inversion          Ankle eversion          P=pain  (Blank rows = not tested)   LOWER EXTREMITY MMT:   L LE strength is deceased in comparison to R due to Pain MMT Right eval Left eval Right 10/02/22 Left 10/02/22 Rt 11/06/22  Hip flexion   P 4+  4- 4  Hip extension   P   4-  Hip abduction   P 4- 4 4-  Hip adduction   P     Hip internal rotation   P     Hip external rotation   P   4  Knee flexion       4  Knee extension       4  Ankle dorsiflexion         Ankle plantarflexion         Ankle inversion         Ankle eversion         P=pain  (Blank rows = not tested)   LUMBAR SPECIAL TESTS:  Straight leg raise test: Negative, Slump test: Negative, and SI Compression/distraction test: Negative          FADER L hip positive   FUNCTIONAL TESTS:  5 times sit to stand: 08/17/22: 40.8 sec 09/18/22= 29.2" (low mat, with UE): 10/02/22: 23.3 sec without UE from standard mat 2 minute walk test: 08/17/22: 185 Feet : 10/02/22: 308 feet    GAIT: Distance walked: 113ft Assistive device utilized: Single point cane Level of assistance: Complete [place] Comments: decreased pace  [place] Assessmnet:  SENSATION: WFL  POSTURE: Forward heads, rounded shoulders  UPPER EXTREMITY ROM:   Active ROM Right 11/22/2022 Left 11/22/2022  Shoulder flexion A120 pain, P130 pain 120  Shoulder extension    Shoulder abduction    Shoulder adduction    Shoulder internal rotation T7 pain T8  Shoulder external rotation T2 pain T3  Elbow flexion    Elbow extension    Wrist flexion    Wrist extension    Wrist ulnar deviation    Wrist radial deviation    Wrist pronation    Wrist supination    (Blank rows = not tested) C elevation pain increases at 80d and increases UPPER EXTREMITY MMT:  MMT Right 11/22/2022 Left 11/22/2022  Shoulder flexion 120 pain 120  Shoulder extension     Shoulder abduction  Shoulder adduction    Shoulder internal rotation    Shoulder external rotation T2 T3  Middle trapezius    Lower trapezius    Elbow flexion    Elbow extension    Wrist flexion    Wrist extension    Wrist ulnar deviation    Wrist radial deviation    Wrist pronation    Wrist supination    Grip strength (lbs)    (Blank rows = not tested)  SHOULDER SPECIAL TESTS:  Impingement tests: Hawkins/Kennedy impingement test: positive   Instability tests:  Negative  Rotator cuff assessment: Empty can test: negative and Full can test: negative  Both negative for overt weakness, min provocation of pain.  Biceps assessment: Yergason's test: negative   No overt weakness JOINT MOBILITY TESTING:  WNLs  PALPATION:  TTP for the superior [place] area and upper trap    TODAY'S TREATMENT: [facility] Adult PT Treatment:                                                DATE: 11/22/22 Therapeutic Exercise: SLR QS SAQ SAQ with concentric assist  Heel slide on slide board AROM Hip flexor stretch with strap at EOM   Manual Therapy: Cross friction massage Patella mobs- inferior focus Ice massage to patella tendon Passive knee flexion- pain at anterior knee   Self Care: Self cross friction Self patella mobs   [facility] Adult PT Treatment:                                                DATE: 11/20/22 Therapeutic Exercise: Cervical traction x10 5" Upper trap stretch x3 15" Cervical rotation stretch x3 15" Shoulder shrug and retractions Manual Therapy: STM to the L upper trap Skilled palpation to identifiy TrPs and taut muscle bands  Trigger Point Dry Needling Treatment: Pre-treatment instruction: Patient instructed on dry needling rationale, procedures, and possible side effects including pain during treatment (achy,cramping feeling), bruising, drop of blood, lightheadedness, nausea, sweating. Patient Consent Given: Yes Education handout provided: Yes Muscles treated: L upper trap   Needle size and number: .30x47mm x 1 Electrical stimulation performed: No Parameters: N/A Treatment response/outcome: Twitch response elicited Post-treatment instructions: Patient instructed to expect possible mild to moderate muscle soreness later today and/or tomorrow. Patient instructed in methods to reduce muscle soreness and to continue prescribed HEP. If patient was dry needled over the lung field, patient was instructed on signs and symptoms of pneumothorax and, however unlikely, to see immediate medical attention should they occur. Patient was also educated on signs and symptoms of infection and to seek medical attention should they occur. Patient verbalized understanding of these instructions and education.  [facility] Adult PT Treatment:                                                DATE: 11/13/22 Therapeutic Exercise: NuStep 5 min L5 UE/LE Bridging 2x10 3 sec Hip abd 2x10 3 sec GTB R SLR c quad set 1x15 3" Heel slide c strap assist x10 10" Heel slide on disc x15 LAQ 2x10 3" 3# Updated HEP  [facility] Adult PT Treatment:                                                DATE: 11/06/22 Therapeutic Exercise: NuStep 5 min L5 UE/LE Bridging 2x10 3 sec Hip abd 2x10 3 sec GTB R SLR c quad set 2x10 3" Heel slide c strap assist x5 30" LAQ 2x10 3" Updated HEP  [facility] Adult PT Treatment:                                                DATE: 10/25/22 Therapeutic Exercise: LTR x10 10 Supine Piriformis stretch x2 20" Seated neural glide x10 each Seated hamstring stretch x2 20" each Seated ball press for abs 2x10, vc for breathing STS from min elevated bari-mat 2x5 UE/back ext RTB 2x10 Manual Therapy: STM/DTM to the L lumbar paraspinals and QL and to the L gluteal and piriformis muscles Skilled palpation to identify TrPs and taut muscle bands  Trigger Point Dry Needling Treatment: Pre-treatment instruction: Patient instructed on dry needling rationale, procedures, and possible side effects  including pain during treatment (achy,cramping feeling), bruising, drop of blood, lightheadedness, nausea, sweating. Patient Consent Given: Yes Education handout provided: Yes Muscles treated: L lower lumbar paraspinals and multifidi; piriformis, glut medius, glut minimus Needle size and number: .30x61mm x 1 and .30x136mm x 1 Electrical stimulation performed: No Parameters: N/A Treatment response/outcome: Twitch response elicited Post-treatment instructions: Patient instructed to expect possible mild to moderate muscle soreness later today and/or tomorrow. Patient instructed in methods to reduce muscle soreness and to continue prescribed HEP. If patient was dry needled over the lung field, patient was instructed on signs and symptoms of pneumothorax and, however unlikely, to see immediate medical attention should they occur. Patient was also educated on signs and symptoms of infection and to seek medical attention should they occur. Patient verbalized understanding of these instructions and education.  PATIENT EDUCATION:  Education details: Eval findings, POC, HEP, self care Person educated: Patient Education method: Explanation, Demonstration, Tactile cues, Verbal cues, and Handouts Education comprehension: verbalized understanding, returned demonstration, verbal cues required, and tactile cues required   HOME EXERCISE PROGRAM: Access Code: [email or web address] URL: [email or web address] Date: 11/20/2022 Prepared by: Gar Ponto  Exercises - Supine Lower Trunk Rotation  - 1-2 x daily - 7 x weekly - 1 sets - 5 reps - 5 hold - Supine Piriformis Stretch with Foot on Ground  - 1-2 x daily - 7 x weekly - 3 reps - 15 hold - Hooklying Single Knee to Chest  - 1-2 x daily - 7 x weekly - 1 sets - 3 reps - 15 hold - Supine Bridge  - 1-2 x daily - 7 x weekly - 1 sets - 10 reps - 3 hold - Hooklying Clamshell with Resistance  - 1 x daily - 7 x weekly - 1 sets - 10 reps - 3 hold - Supine Hip  Adduction Isometric with Ball  - 1 x daily - 7 x weekly - 1 sets - 10 reps - 3 hold - Seated Flexion Stretch with Swiss Ball  - 1 x daily - 7 x weekly - 1 sets - 6 reps - 20 hold - Seated Abdominal Press into [employer]  -  1 x daily - 7 x weekly - 1 sets - 10 reps - 3 hold - Standing Hip Flexor Stretch  - 1 x daily - 7 x weekly - 1 sets - 10 reps - 20 hold - Supine Posterior Pelvic Tilt  - 1 x daily - 7 x weekly - 1 sets - 10 reps - 3 hold - Seated March with Resistance  - 1 x daily - 7 x weekly - 3 sets - 10 reps - seated clam with resistance band  - 1 x daily - 7 x weekly - 3 sets - 10 reps - Active Straight Leg Raise with Quad Set  - 1 x daily - 7 x weekly - 2 sets - 10 reps - 3 hold - Seated Long Arc Quad  - 1 x daily - 7 x weekly - 2 sets - 10 reps - 3 hold - Supine Heel Slide with Strap  - 1 x daily - 7 x weekly - 1 sets - 3-5 reps - 30 hold - Seated Cervical Retraction  - 3 x daily - 7 x weekly - 1 sets - 5 reps - 5 hold - Seated Upper Trapezius Stretch (Mirrored)  - 2 x daily - 7 x weekly - 1 sets - 3 reps - 15 hold - Seated Cervical Rotation AROM  - 2 x daily - 7 x weekly - 1 sets - 3 reps - 15 hold - Standing Shoulder Shrug and Retraction with Resistance  - 2 x daily - 7 x weekly - 1 sets - 10 reps - 10 hold   ASSESSMENT:   CLINICAL IMPRESSION: Pt c/o increased right anterior knee pain. Pain located at patella and below. Pt  is not a candidate for IONTO or Ultrasound due to pacemaker. Educated pt in cross friction massage and patella mobs. She is stiff into inferior patella glides and has pain with knee flexion. Worked on quad stretching and eccentric quad. She has pain with concentric SAQ and was provided assist to decrease pain with this motion. At end of session she reported no change in her pain. Pain present during knee flexion with ambulation. Pt will continue to benefit from skilled PT to address impairments for improved function with less pain.  OBJECTIVE IMPAIRMENTS: decreased  activity tolerance, decreased balance, diffculity walking, decreased ROM, decreased strength, increased muscle spasms, obesity, and pain.    ACTIVITY LIMITATIONS: carrying, lifting, bending, sitting, standing, squatting, sleeping, stairs, and locomotion level   PARTICIPATION LIMITATIONS: meal prep, cleaning, laundry, shopping, and community activity   PERSONAL FACTORS: Fitness, Past/current experiences, Time since onset of injury/illness/exacerbation, and 3+ comorbidities:    Mod limited, pulling pain R hip are also affecting patient's functional outcome.    REHAB POTENTIAL: Good   CLINICAL DECISION MAKING: Evolving/moderate complexity   EVALUATION COMPLEXITY: Moderate     GOALS:   SHORT TERM GOALS: Target date: 08/24/22   Pt will be Ind in an initial HEP  Baseline: initiated Status: proper completion Goal status: MET   2.  Pt will voice understanding of measures to assist in pain reduction Baseline: initiated Status: HEP and sleeping positions with support; use of cold pack Goal status: MET   LONG TERM GOALS: Target date: 12/07/22   Pt will be Ind in a final HEP to maintain achieved LOF Baseline: initiated Goal status: ONGOING   2.  Pt's trunk ROM will improve to minimal or less for improved function and as demonstration of decreased pain Baseline: see flow sheets 10/10/22: Lumbar AROM  minimally improved and with min pain -see chart Goal status: ONGOING   3.  Pt will report a decreased in pain to 3/10 or less with daily activities Baseline: 6-8/10 Status: 09/11/22= 2/10 10/10/22: 2/10 on average Goal status: MET   4.  Pt L hip will demonstrate ROM and strength equal to that of the R hip for improved function Baseline: pain with all passive movements 10/02/22: PROM equal bilateral with min stretch , left hip weak compared to right Goal status: PARTIALLY MET   5. Improve 5xSTS by MCID of 5" and 2MWT by MCID of 48ft as indication of improved functional mobility  Baseline:  TBA STATUS= 09/18/22= 29.2" (low mat, with UE) Goal status: MET for 5xSTS and 2 MWT   6.  Pt's FOTO score will improved to the predicted value of 55% as indication of improved function  Baseline: 38% 10/05/22: 54% Goal status: Improved   7. Pt will report a decrease in L shoulder pain to 2/10 or less for improved QOL   Baseline: 5/10 Goal status: Initial  8. Pt will be able to lift 5# from waist height to above shoulder for functional use of her L UE Baseline: NT Goal status: Initial  9. Increase R knee flexion AROM to 105d or greater for functional mobility sitting and negotiating steps Baseline: 95d Goal status: Initial  10.Increase R hip and knee strength to 4+/5  for improved functional mobility Baseline: See flow sheets Goal status: Initial  PLAN:   PT FREQUENCY: 2x/week   PT DURATION: 6 weeks   PLANNED INTERVENTIONS: Therapeutic exercises, Therapeutic activity, Neuromuscular re-education, Balance training, Gait training, Patient/Family education, Self Care, Joint mobilization, Stair training, Aquatic Therapy, Dry Needling, Electrical stimulation, Spinal manipulation, Spinal mobilization, Cryotherapy, Moist heat, Taping, Traction, Ultrasound, Ionotophoresis 4mg /ml Dexamethasone, Manual therapy, and Re-evaluation.   PLAN FOR NEXT SESSION: ; L hip strength and lumbar flexibility for completion of goals;   Pt has pacemaker, NO IONTO or [place]on Anna, PTA 11/22/22 3:03 PM Phone: [phone] Fax: [phone]

## 2022-11-23 ENCOUNTER — Telehealth: Payer: Self-pay | Admitting: Internal Medicine

## 2022-11-23 DIAGNOSIS — R0902 Hypoxemia: Secondary | ICD-10-CM

## 2022-11-23 DIAGNOSIS — R7989 Other specified abnormal findings of blood chemistry: Secondary | ICD-10-CM

## 2022-11-23 DIAGNOSIS — R0602 Shortness of breath: Secondary | ICD-10-CM

## 2022-11-23 LAB — HEPATIC FUNCTION PANEL
ALT: 138 U/L — ABNORMAL HIGH (ref 0–35)
AST: 111 U/L — ABNORMAL HIGH (ref 0–37)
Albumin: 4 g/dL (ref 3.5–5.2)
Alkaline Phosphatase: 123 U/L — ABNORMAL HIGH (ref 39–117)
Bilirubin, Direct: 0.1 mg/dL (ref 0.0–0.3)
Total Bilirubin: 0.4 mg/dL (ref 0.2–1.2)
Total Protein: 7.1 g/dL (ref 6.0–8.3)

## 2022-11-23 NOTE — Telephone Encounter (Signed)
Called and spoke with patient. She stated that she has been taking Tylenol more recently due to her injuries but she has slower down with her usage. She also started doing Repatha injections about 4-5 weeks ago. She does these every 14 days. She is no longer on Keppra and asked that it be removed from her chart. She was placed on clobazam 10mg  twice daily instead.

## 2022-11-23 NOTE — Telephone Encounter (Signed)
I called and spoke with the pt and notified of response per MR  She verbalized understanding  I have put in orders for requested labs  Nothing further needed

## 2022-11-23 NOTE — Telephone Encounter (Signed)
She might be having chronic tylenol toxixit  Plan  - come and check tylenol level   - check INR as well - can she stop tylenol? She needs to

## 2022-11-23 NOTE — Telephone Encounter (Signed)
Big jump in LFT nov 2023/jan 2024 =-> Apr 2024 .  Plan - cannot start antiofibrotic till this is address - likely reason - is a meedcine she is on  - how much tylenol is she taking and since when? -  - other meds that could be involved afre antibiotics, keppra   - what new meds since nov 2023?   LMK ASAP  Thanks    SIGNATURE    Dr. Kalman Shan, M.D., F.C.C.P,  Pulmonary and Critical Care Medicine Staff Physician, [facility] Director - Interstitial Lung Disease  Program  Medical Director - [facility] Pulmonary Fibrosis [facility] at [place], [place], [address]   Pager: [phone], If no answer  -> Check AMION or Try [phone] Telephone (clinical office): [phone] Telephone (research): [phone]  12:06 PM 11/23/2022     Latest Reference Range & Units 03/27/06 13:45 09/03/06 12:51 07/04/07 09:36 10/09/07 09:26 02/16/08 14:34 08/17/08 10:44 03/04/09 09:27 08/24/09 09:34 01/03/12 11:20 06/18/13 10:01 06/18/14 09:00 12/01/14 08:47 12/29/14 13:00 10/25/15 11:09 11/15/15 08:34 02/03/16 14:15 11/26/16 10:29 02/22/17 08:52 02/27/17 15:18 11/27/17 10:43 01/30/18 15:50 12/04/18 12:40 03/17/19 15:55 05/05/19 11:13 10/01/19 14:08 10/29/19 12:19 12/07/19 10:53 02/15/21 14:29 04/10/21 12:13 04/14/21 10:58 04/26/21 12:41 06/04/21 03:32 06/05/21 04:20 10/19/21 09:11 07/17/22 15:05 08/24/22 13:56 11/22/22 16:04  Alkaline Phosphatase 39 - 117 U/L 107 126 (H)   98 105   98 104 109 127 111 141 (H) 126 (H) 125 112 107 120 100 115 97 121 (H) 94 133 (H) 106 102 115 100 86 83 111 116 122 (H) 84 109 123 (H)  Albumin 3.5 - 5.2 g/dL 3.9 3.7 4.0  3.9 3.8 3.7 3.6 4.0 4.1 3.5 3.9 3.8 4.3 4.1 4.1 4.0 4.1 4.4 4.1 4.2 4.0 4.3 3.9 4.1 4.0 4.2 4.3 3.9 3.9 3.8 2.7 (L) 2.6 (L) 3.9 4.3 4.1 4.0  AST 0 - 37 U/L 34 37 37 36 31 35 32 31 32 33  29 29 33 27 30 33 25 33 36 (H) 37 36 33 42 (H) 34 69 (H) 44 (H) 37 50 (H) 42 (H) 35 35 26 25 25  40 (H) 39 (H)  111 (H)  ALT 0 - 35 U/L 34 38 38 (H) 37 (H) 28 28 30 30  32 25 25 33 29 22 27 29 27 35  39 (H) 42 (H) 39 (H) 38 (H) 41 (H) 42 (H) 63 (H) 58 (H) 41 (H) 47 (H) 40 (H) 37 (H) 34 20 19 21  47 (H) 41 (H) 138 (H)  Total Protein 6.0 - 8.3 g/dL 7.6 6.9   7.6 7.3   7.3 8.0 7.4 7.5 7.3 7.9 7.4 7.5 7.4 7.5 7.6 7.5 7.9 7.0 7.2 6.9 7.1 7.3 6.8 7.1 7.0 7.0 7.0 6.3 (L) 6.5 6.6 7.4 7.0 7.1  Bilirubin, Direct 0.0 - 0.3 mg/dL  0.1   0.1 0.1                  0.0      0.1      0.1 0.1  Total Bilirubin 0.2 - 1.2 mg/dL 0.5 0.8   0.7 0.5   0.3 0.6 0.5 0.42 0.5 0.4 0.4 0.6 0.4 0.4 0.5 0.4 0.4 0.3 0.3 0.4 0.3 0.4 0.5 0.4 0.4 0.5 0.7 0.8 0.6 0.3 0.5 0.4 0.4  (H): Data is abnormally high (L): Data is abnormally low     Current Outpatient Medications:    acetaminophen (  TYLENOL) 500 MG tablet, Take 500 mg by mouth every 6 (six) hours as needed for moderate pain., Disp: , Rfl:    acetaminophen-codeine (TYLENOL/CODEINE #3) 300-30 MG tablet, Take 1 tablet by mouth every 8 (eight) hours as needed for moderate pain., Disp: 90 tablet, Rfl: 3   albuterol (PROVENTIL) (2.5 MG/3ML) 0.083% nebulizer solution, USE 1 VIAL IN NEBULIZER EVERY 6 HOURS (Patient taking differently: Take 2.5 mg by nebulization every 6 (six) hours as needed for shortness of breath.), Disp: 120 mL, Rfl: 11   albuterol (VENTOLIN HFA) 108 (90 Base) MCG/ACT inhaler, Inhale 2 puffs into the lungs every 6 (six) hours as needed for wheezing or shortness of breath., Disp: 18 g, Rfl: 3   Ascorbic Acid (VITAMIN C) 1000 MG tablet, Take 1,000 mg by mouth daily., Disp: , Rfl:    aspirin EC 81 MG tablet, Take 81 mg by mouth daily. Swallow whole., Disp: , Rfl:    azelastine (ASTELIN) 0.1 % nasal spray, Place 1 spray into both nostrils 2 (two) times daily. Use in each nostril as directed, Disp: 90 mL, Rfl: 3   budesonide-formoterol (SYMBICORT) 80-4.5 MCG/ACT inhaler, Inhale 1 puff into the lungs 2 (two) times daily., Disp: 1 each, Rfl: 12   cefUROXime (CEFTIN) 500 MG tablet, Take  1 tablet (500 mg total) by mouth 2 (two) times daily with a meal., Disp: 14 tablet, Rfl: 0   cetirizine (ZYRTEC) 10 MG tablet, Take 10 mg by mouth daily as needed for allergies., Disp: , Rfl:    Cholecalciferol (DIALYVITE VITAMIN D 5000) 125 MCG (5000 UT) capsule, Take 5,000 Units by mouth daily., Disp: , Rfl:    cloBAZam (ONFI) 10 MG tablet, Take 1 tablet (10 mg total) by mouth 2 (two) times daily., Disp: 60 tablet, Rfl: 5   diltiazem (CARDIZEM CD) 120 MG 24 hr capsule, Take 1 capsule (120 mg total) by mouth daily., Disp: 90 capsule, Rfl: 3   empagliflozin (JARDIANCE) 10 MG TABS tablet, Take 1 tablet (10 mg total) by mouth daily., Disp: 90 tablet, Rfl: 3   Evolocumab (REPATHA SURECLICK) 140 MG/ML SOAJ, Inject 140 mg into the skin every 14 (fourteen) days., Disp: 6 mL, Rfl: 3   famotidine (PEPCID) 40 MG tablet, Take 1 tablet (40 mg total) by mouth at bedtime., Disp: 360 tablet, Rfl: 6   fexofenadine (ALLEGRA) 180 MG tablet, Take 180 mg by mouth daily as needed for allergies (alternates with zyrtec sometimes)., Disp: , Rfl:    fluticasone (CUTIVATE) 0.05 % cream, Apply topically 2 (two) times daily., Disp: 60 g, Rfl: 2   fluticasone (FLONASE) 50 MCG/ACT nasal spray, Place 2 sprays into both nostrils daily as needed for allergies or rhinitis., Disp: 48 g, Rfl: 3   FOLIC ACID PO, Take 1 tablet by mouth daily., Disp: , Rfl:    furosemide (LASIX) 20 MG tablet, TAKE 2 TABLETS (40 MG) 3 DAYS PER WEEK AND TAKE 1 TABLET (20 MG) THE OTHER DAYS OF THE WEEK, Disp: 130 tablet, Rfl: 1   gabapentin (NEURONTIN) 100 MG capsule, Take 1 capsule (100 mg total) by mouth 3 (three) times daily., Disp: 90 capsule, Rfl: 6   gabapentin (NEURONTIN) 300 MG capsule, Take 1 capsule (300 mg total) by mouth at bedtime., Disp: 30 capsule, Rfl: 2   hydroxypropyl methylcellulose / hypromellose (ISOPTO TEARS / GONIOVISC) 2.5 % ophthalmic solution, Place 1 drop into both eyes at bedtime., Disp: , Rfl:    latanoprost (XALATAN) 0.005 %  ophthalmic solution, Place 1 drop into both  eyes at bedtime., Disp: , Rfl:    levETIRAcetam (KEPPRA) 500 MG tablet, Take 1 tablet (500 mg total) by mouth 2 (two) times daily., Disp: 180 tablet, Rfl: 1   levothyroxine (SYNTHROID) 50 MCG tablet, TAKE 1 TABLET EVERY DAY, Disp: 90 tablet, Rfl: 0   magnesium oxide (MAG-OX) 400 MG tablet, Take 1 tablet (400 mg total) by mouth 2 (two) times daily., Disp: 180 tablet, Rfl: 3   mometasone (ELOCON) 0.1 % lotion, Apply 1 application topically daily as needed (rash)., Disp: 60 mL, Rfl: 1   montelukast (SINGULAIR) 10 MG tablet, TAKE 1 TABLET AT BEDTIME, Disp: 90 tablet, Rfl: 3   Multiple Vitamins-Minerals (MULTIVITAMIN PO), Take 1 tablet by mouth daily., Disp: , Rfl:    nitroGLYCERIN (NITROSTAT) 0.4 MG SL tablet, DISSOLVE ONE TABLET UNDER THE TONGUE EVERY 5 MINUTES AS NEEDED FOR CHEST PAIN.  DO NOT EXCEED A TOTAL OF 3 DOSES IN 15 MINUTES (Patient taking differently: Place 0.4 mg under the tongue every 5 (five) minutes as needed for chest pain.), Disp: 25 tablet, Rfl: 0   nystatin (MYCOSTATIN) 100000 UNIT/ML suspension, Take 5 mLs (500,000 Units total) by mouth 4 (four) times daily., Disp: 60 mL, Rfl: 0   pantoprazole (PROTONIX) 40 MG tablet, TAKE 1 TABLET EVERY DAY, Disp: 90 tablet, Rfl: 3   polyethylene glycol powder (GLYCOLAX/MIRALAX) powder, Take 17 grams by mouth daily, Disp: 1080 g, Rfl: 0   potassium chloride (KLOR-CON M) 10 MEQ tablet, TAKE 1/2 TABLET EVERY DAY (CALL OFFICE TO SCHEDULE YEARLY APPT), Disp: 45 tablet, Rfl: 3   Respiratory Therapy Supplies (FLUTTER) DEVI, 1 Device by Does not apply route as needed., Disp: 1 each, Rfl: 0   sodium chloride HYPERTONIC 3 % nebulizer solution, Take by nebulization 2 (two) times daily as needed for cough. 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, Disp: 750 mL, Rfl: 12   tiZANidine (ZANAFLEX) 4 MG tablet, Take 0.5-1 tablets (2-4 mg total) by mouth every 8 (eight) hours as needed for muscle spasms., Disp: 90 tablet, Rfl: 5   traZODone  (DESYREL) 100 MG tablet, TAKE 1 TO 1 AND 1/2 TABLETS AT BEDTIME, Disp: 135 tablet, Rfl: 1   triamcinolone cream (KENALOG) 0.1 %, Apply 1 Application topically 2 (two) times daily., Disp: 453.6 g, Rfl: 0  Current Facility-Administered Medications:    lidocaine (XYLOCAINE) 1 % (with pres) injection 1 mL, 1 mL, Other, Once, Lovorn, Megan, MD   lidocaine (XYLOCAINE) 1 % (with pres) injection 3 mL, 3 mL, Other, Once, Lovorn, Megan, MD   triamcinolone acetonide (KENALOG-40) injection 40 mg, 40 mg, Intramuscular, Once, Lovorn, Aundra Millet, MD

## 2022-11-26 ENCOUNTER — Other Ambulatory Visit (HOSPITAL_COMMUNITY): Payer: Self-pay

## 2022-11-26 ENCOUNTER — Telehealth: Payer: Self-pay

## 2022-11-26 NOTE — Telephone Encounter (Signed)
Received New start paperwork for ESBRIET. Will update as we work through the benefits process.  Submitted a Prior Authorization request to [facility] for ESBRIET via CoverMyMeds. PA for brand-name is likely to be denied as the questions specifically ask if pt has previously tried [employer] and generic pirfenidone tablet/capsule. Will update once we receive a response.  Key: [id number]

## 2022-11-27 ENCOUNTER — Other Ambulatory Visit: Payer: Medicare Other

## 2022-11-27 ENCOUNTER — Ambulatory Visit: Payer: Medicare Other | Admitting: Physical Therapy

## 2022-11-27 ENCOUNTER — Encounter: Payer: Self-pay | Admitting: Physical Therapy

## 2022-11-27 DIAGNOSIS — R7989 Other specified abnormal findings of blood chemistry: Secondary | ICD-10-CM

## 2022-11-27 DIAGNOSIS — R0902 Hypoxemia: Secondary | ICD-10-CM

## 2022-11-27 DIAGNOSIS — M25552 Pain in left hip: Secondary | ICD-10-CM

## 2022-11-27 DIAGNOSIS — M25512 Pain in left shoulder: Secondary | ICD-10-CM | POA: Diagnosis not present

## 2022-11-27 DIAGNOSIS — R0602 Shortness of breath: Secondary | ICD-10-CM

## 2022-11-27 DIAGNOSIS — G8929 Other chronic pain: Secondary | ICD-10-CM

## 2022-11-27 DIAGNOSIS — M6281 Muscle weakness (generalized): Secondary | ICD-10-CM

## 2022-11-27 LAB — PROTIME-INR
INR: 1 ratio (ref 0.8–1.0)
Prothrombin Time: 10.8 s (ref 9.6–13.1)

## 2022-11-27 NOTE — Therapy (Addendum)
OUTPATIENT PHYSICAL THERAPY TREATMENT NOTE/DIscharge   Patient Name: [name] MRN: [id number] DOB:[birth year], 72 y.o., female Today's Date: 11/27/2022  PCP: [name], NP   REFERRING PROVIDER: Georganna Skeans, MD; Raquel James, PA-C        END OF SESSION:   PT End of Session - 11/27/22 4098     Visit Number 21    Number of Visits 27    Date for PT Re-Evaluation 12/07/22    Authorization Type MEDICARE PART A AND B    Progress Note Due on Visit 20    PT Start Time 0935    PT Stop Time 1015    PT Time Calculation (min) 40 min                 Past Medical History:  Diagnosis Date   AICD (automatic cardioverter/defibrillator) present    Anxiety    Arthritis    Back pain    Breast cancer 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   Bronchiectasis    Cerebral aneurysm, nonruptured    had a clip put in   CHF (congestive heart failure)    Clostridium difficile infection    Depressive disorder, not elsewhere classified    Diverticulosis of colon (without mention of hemorrhage)    Esophageal reflux    Fatty liver    Fibromyalgia    Gastritis    GERD (gastroesophageal reflux disease)    GI bleed 2004   Glaucoma    Hiatal hernia    History of COVID-19 05/04/2021   Hyperlipidemia    Hypertension    Hypothyroidism    Internal hemorrhoids    Joint pain    Multifocal pneumonia 06/03/2021   Obstructive sleep apnea (adult) (pediatric)    Osteoarthritis    Ostium secundum type atrial septal defect    Other chronic nonalcoholic liver disease    Other pulmonary embolism and infarction    Palpitations    Paroxysmal ventricular tachycardia    Personal history of radiation therapy    PONV (postoperative nausea and vomiting)    Presence of permanent cardiac pacemaker    PUD (peptic ulcer disease)    Radiation 02/03/15-03/10/15   Right Breast   Sarcoid    per pt , not sure   Schatzki's ring    Shortness of breath    Sleep apnea    Stroke  2013   tia/ pt feels it was around 2008 0r 2009   Takotsubo syndrome    Tubular adenoma of colon    Unspecified transient cerebral ischemia    Unspecified vitamin D deficiency    Past Surgical History:  Procedure Laterality Date   ABI  2006   normal   BREAST EXCISIONAL BIOPSY     BREAST LUMPECTOMY Right 2016   BREAST LUMPECTOMY WITH RADIOACTIVE SEED LOCALIZATION Right 01/03/2015   Procedure: BREAST LUMPECTOMY WITH RADIOACTIVE SEED LOCALIZATION;  Surgeon: Chevis Pretty III, MD;  Location: [facility] OR;  Service: General;  Laterality: Right;   CARDIAC DEFIBRILLATOR PLACEMENT  2006; 2012   BSX single chamber ICD   carotid dopplers  2006   neg   CEREBRAL ANEURYSM REPAIR  02/1999   COLONOSCOPY     HAMMER TOE SURGERY  11/15/2020   3rd digit bilateral feet    hospitalization  2004   GI bleed, PUD, diverticulosis (EGD,colonscopy)   hospitalization     PE, NSVT, s/p defib   KNEE ARTHROSCOPY     bilateral   LEFT  HEART CATHETERIZATION WITH CORONARY ANGIOGRAM N/A 02/22/2012   Procedure: LEFT HEART CATHETERIZATION WITH CORONARY ANGIOGRAM;  Surgeon: Kathleene Hazel, MD;  Location: [facility] CATH LAB;  Service: Cardiovascular;  Laterality: N/A;   PARTIAL HYSTERECTOMY     Fibroids   TONGUE BIOPSY  12/12/2017   due to sore tongue and white patches/abnormal cells   TOTAL KNEE ARTHROPLASTY Left 02/13/2016   Procedure: TOTAL KNEE ARTHROPLASTY;  Surgeon: Dannielle Huh, MD;  Location: [facility] OR;  Service: Orthopedics;  Laterality: Left;   TOTAL KNEE ARTHROPLASTY Right 05/22/2021   Procedure: TOTAL KNEE ARTHROPLASTY;  Surgeon: Dannielle Huh, MD;  Location: [facility] ORS;  Service: Orthopedics;  Laterality: Right;  with block   UPPER GASTROINTESTINAL ENDOSCOPY     Patient Active Problem List   Diagnosis Date Noted   Bicipital tendinitis of left shoulder 11/09/2022   Partial symptomatic epilepsy with complex partial seizures, not intractable, without status epilepticus 08/10/2022   Low back pain 08/10/2022   Gait  abnormality 05/04/2022   Muscle spasm 05/04/2022   Upper airway cough syndrome 01/19/2022   Chronic left shoulder pain 11/13/2021   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   Localized swelling of right lower leg 08/29/2021   Tonsillar exudate 08/29/2021   Candida vaginitis 08/28/2021   Chronic pain syndrome 08/25/2021   Multifocal pneumonia 06/03/2021   Pre-operative respiratory examination 05/18/2021   Statin myopathy 04/19/2021   Pain in joint of right shoulder 02/10/2021   Arthritis of both acromioclavicular joints 01/02/2021   Trochanteric bursitis of right hip 10/03/2020   Tailbone injury 05/05/2020   UTI (urinary tract infection) 05/05/2020   Insomnia 05/05/2020   Leg cramps 02/16/2020   Dysuria 02/16/2020   Right hip impingement syndrome 02/01/2020   Right bicipital tenosynovitis 02/01/2020   Bronchiectasis without complication 01/22/2020   Myofascial pain 12/21/2019   Estrogen deficiency 12/10/2019   Obesity (BMI 30-39.9) 12/10/2019   Bronchiectasis with (acute) exacerbation 10/23/2019   Elevated LFTs 10/15/2019   Lower abdominal pain 09/01/2019   Neck pain 10/25/2017   Paresthesias in left hand 10/25/2017   Paresthesia of foot, bilateral 04/12/2017   Primary osteoarthritis of both hands 09/27/2016   Primary osteoarthritis of both knees 09/27/2016   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   Nasopharyngitis, 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/2016   S/P total knee replacement 02/13/2016   Chronic pain of both knees 12/06/2015   Globus pharyngeus 05/17/2015   Genetic testing 12/23/2014   Family history of breast cancer    Family history of colon cancer    Family history of pancreatic cancer    History of breast cancer 11/23/2014   Allergy to ACE inhibitors 09/27/2014   Prediabetes 06/23/2013   Muscle spasms of both lower extremities 01/15/2013   Hx of Clostridium difficile infection 10/10/2012   Dyspnea on exertion 04/02/2012   NICM (nonischemic cardiomyopathy) 03/07/2012    Post-menopausal 01/11/2012   Atypical chest pain 11/07/2011   Obstructive sleep apnea 04/04/2010   V-tach 01/03/2009   ICD  [employer]  Single chamber 01/03/2009   PFO (patent foramen ovale) 09/29/2008   Vitamin D deficiency 08/17/2008   Hypothyroidism 11/18/2006   Hyperlipidemia 11/18/2006   Depression with anxiety 11/18/2006   Essential hypertension 11/18/2006   Mitral valve prolapse 11/18/2006   Cerebral aneurysm 11/18/2006   Allergic rhinitis 11/18/2006   GERD 11/18/2006   Diverticulosis of colon 11/18/2006   Fatty liver 11/18/2006   Fibromyalgia 11/18/2006    REFERRING DIAG: M54.50 (ICD-10-CM) - Left-sided low back pain without sciatica, unspecified chronicity; M25.552 (ICD-10-CM) - Left hip  pain; Pain in left shoulder; Presence of right artificial knee joint.  THERAPY DIAG:  Chronic left shoulder pain  Chronic pain of right knee  Pain in left hip  Muscle weakness (generalized)  Rationale for Evaluation and Treatment Rehabilitation  PERTINENT HISTORY:  Automatic cardioverter/defibrillator; Bilat knee replacements; arthritis; fibromyalgia; depression, High BMI  PRECAUTIONS: Pacemaker  SUBJECTIVE:                                                                                                                                                                                      SUBJECTIVE STATEMENT:  Just normal hip pain, not bad. My knee is better. I have been working on it. I think I am ready to cancel the rest of my appointments. Just give me some exercises for my shoulder.   PAIN:  Are you having pain? Yes: NPRS scale: 0/10 for L hip; 0/10 for low back Pain location: L lowback, gluteal, lat hip, groin, and ant thigh Pain description: ache, sharp occasionally, constant Aggravating factors: Walking, sleeping, in/out of car, bending over Relieving factors: Muscle relaxors, heat pad, tylenol On Eval pain range=6-8/10  PAIN:  Are you having pain? Yes: NPRS  scale: 2/10 Pain location: Superior GH jt and upper trap Pain description: ache, constant Aggravating factors: Arm movement c dressing and reaching up Relieving factors: Resting  PAIN:  Are you having pain? Yes: NPRS scale: 2/10 Pain location: R knee Pain description: ache, intermittent Aggravating factors: bending the knee, sit to stand Relieving factors: Resting  OBJECTIVE: (objective measures completed at initial evaluation unless otherwise dated)  Pt reports xrays were taken with [facility] yesterday. He was able to recall there was no fracture, proper spacing for the joint   PATIENT SURVEYS:  FOTO: Perceived function   38%, predicted   55%  09/04/22: 54%  10/02/22: 54%   SCREENING FOR RED FLAGS: Bowel or bladder incontinence: No Spinal tumors: No Compression fracture: No   COGNITION: Overall cognitive status: Within functional limits for tasks assessed                          SENSATION: WFL   MUSCLE LENGTH: Hamstrings: Right NT deg; Left NT deg Maisie Fus test: Right NT deg; Left NT deg   POSTURE: increased lumbar lordosis   PALPATION: TTP with increased muscle tightness to the L gluteal region   LUMBAR ROM:    AROM eval 10/10/22 11/27/22  Flexion Mod limited, low back pulling pain Reaches min shins, low back pain Lower shins , slight back pain  Extension Mod limited, pain low back and L hip Min limited, pain on low back Min limited,min  pain   Right lateral flexion Mod limited, pulling pain R hip Min limited , pain in left hip WFL, min pain   Left lateral flexion Mod limited, pain across low back Min limited, min low back pain  WFL, min pain  Right rotation Mod limited, pulling pain R hip and across low back WFL min pain WFL min pain  Left rotation Mod limited, pain across low back WFL , min low back  WFL min pain   (Blank rows = not tested)   LOWER EXTREMITY ROM:     L decreased in comparison to R  due to pain Passive  Right eval Left eval Left 10/02/22 RT 11/06/22  Rt 11/13/22 Rt  11/27/22  Hip flexion   P WFL     Hip extension   P      Hip abduction   P WFL     Hip adduction          Hip internal rotation   P WFL     Hip external rotation   P WFL     Knee flexion      95d 100d 105  Knee extension          Ankle dorsiflexion          Ankle plantarflexion          Ankle inversion          Ankle eversion          P=pain  (Blank rows = not tested)   LOWER EXTREMITY MMT:   L LE strength is deceased in comparison to R due to Pain MMT Right eval Left eval Right 10/02/22 Left 10/02/22 Rt 11/06/22 RT/LT 11/24/22  Hip flexion   P 4+  4- 4 4 / 4  Hip extension   P   4-   Hip abduction   P 4- 4 4- 4 / 4  Hip adduction   P      Hip internal rotation   P      Hip external rotation   P   4   Knee flexion       4 4+ / NT  Knee extension       4 4+ / NT  Ankle dorsiflexion          Ankle plantarflexion          Ankle inversion          Ankle eversion          P=pain  (Blank rows = not tested)   LUMBAR SPECIAL TESTS:  Straight leg raise test: Negative, Slump test: Negative, and SI Compression/distraction test: Negative          FADER L hip positive   FUNCTIONAL TESTS:  5 times sit to stand: 08/17/22: 40.8 sec 09/18/22= 29.2" (low mat, with UE): 10/02/22: 23.3 sec without UE from standard mat 2 minute walk test: 08/17/22: 185 Feet : 10/02/22: 308 feet    GAIT: Distance walked: 116ft Assistive device utilized: Single point cane Level of assistance: Complete Independence Comments: decreased pace  [place] Assessmnet:  SENSATION: WFL  POSTURE: Forward heads, rounded shoulders  UPPER EXTREMITY ROM:   Active ROM Right 11/27/2022 Left 11/27/2022 11/27/22  Shoulder flexion A120 pain, P130 pain 120 125  Shoulder extension     Shoulder abduction     Shoulder adduction     Shoulder internal rotation T7 pain T8   Shoulder external rotation T2 pain T3   Elbow flexion  Elbow extension     Wrist flexion     Wrist extension     Wrist ulnar  deviation     Wrist radial deviation     Wrist pronation     Wrist supination     (Blank rows = not tested) C elevation pain increases at 80d and increases UPPER EXTREMITY MMT:  MMT Right 11/27/2022 Left 11/27/2022  Shoulder flexion 120 pain 120  Shoulder extension    Shoulder abduction    Shoulder adduction    Shoulder internal rotation    Shoulder external rotation T2 T3  Middle trapezius    Lower trapezius    Elbow flexion    Elbow extension    Wrist flexion    Wrist extension    Wrist ulnar deviation    Wrist radial deviation    Wrist pronation    Wrist supination    Grip strength (lbs)    (Blank rows = not tested)  SHOULDER SPECIAL TESTS:  Impingement tests: Hawkins/Kennedy impingement test: positive   Instability tests:  Negative  Rotator cuff assessment: Empty can test: negative and Full can test: negative  Both negative for overt weakness, min provocation of pain.  Biceps assessment: Yergason's test: negative   No overt weakness JOINT MOBILITY TESTING:  WNLs  PALPATION:  TTP for the superior GH area and upper trap    TODAY'S TREATMENT: [facility] Adult PT Treatment:                                                DATE: 11/27/22 Therapeutic Exercise: Lumbar AROM all planes -see chart 5 # OH press using bilat UE x 3, increased pain in left shoulder 5# cabinet reaching unable to reach middle shelf using weight AROM cabinet reaching, can reach upper shelf Shoulder Flexion AROM 125 Supine cane pullovers Supine red band horizontal abduction x 10 Supine diagonal with LUE -small rom within comfortable ROM Data collection for goal check  Updated and verbally reviewed HEP   [facility] Adult PT Treatment:                                                DATE: 11/22/22 Therapeutic Exercise: SLR QS SAQ SAQ with concentric assist  Heel slide on slide board AROM Hip flexor stretch with strap at EOM   Manual Therapy: Cross friction massage Patella mobs- inferior focus Ice  massage to patella tendon Passive knee flexion- pain at anterior knee   Self Care: Self cross friction Self patella mobs   [facility] Adult PT Treatment:                                                DATE: 11/20/22 Therapeutic Exercise: Cervical traction x10 5" Upper trap stretch x3 15" Cervical rotation stretch x3 15" Shoulder shrug and retractions Manual Therapy: STM to the L upper trap Skilled palpation to identifiy TrPs and taut muscle bands  Trigger Point Dry Needling Treatment: Pre-treatment instruction: Patient instructed on dry needling rationale, procedures, and possible side effects including pain during treatment (achy,cramping feeling), bruising, drop of blood, lightheadedness, nausea, sweating. Patient  Consent Given: Yes Education handout provided: Yes Muscles treated: L upper trap  Needle size and number: .30x19mm x 1 Electrical stimulation performed: No Parameters: N/A Treatment response/outcome: Twitch response elicited Post-treatment instructions: Patient instructed to expect possible mild to moderate muscle soreness later today and/or tomorrow. Patient instructed in methods to reduce muscle soreness and to continue prescribed HEP. If patient was dry needled over the lung field, patient was instructed on signs and symptoms of pneumothorax and, however unlikely, to see immediate medical attention should they occur. Patient was also educated on signs and symptoms of infection and to seek medical attention should they occur. Patient verbalized understanding of these instructions and education.  [facility] Adult PT Treatment:                                                DATE: 11/13/22 Therapeutic Exercise: NuStep 5 min L5 UE/LE Bridging 2x10 3 sec Hip abd 2x10 3 sec GTB R SLR c quad set 1x15 3" Heel slide c strap assist x10 10" Heel slide on disc x15 LAQ 2x10 3" 3# Updated HEP   [facility] Adult PT Treatment:                                                DATE: 11/06/22 Therapeutic  Exercise: NuStep 5 min L5 UE/LE Bridging 2x10 3 sec Hip abd 2x10 3 sec GTB R SLR c quad set 2x10 3" Heel slide c strap assist x5 30" LAQ 2x10 3" Updated HEP  [facility] Adult PT Treatment:                                                DATE: 10/25/22 Therapeutic Exercise: LTR x10 10 Supine Piriformis stretch x2 20" Seated neural glide x10 each Seated hamstring stretch x2 20" each Seated ball press for abs 2x10, vc for breathing STS from min elevated bari-mat 2x5 UE/back ext RTB 2x10 Manual Therapy: STM/DTM to the L lumbar paraspinals and QL and to the L gluteal and piriformis muscles Skilled palpation to identify TrPs and taut muscle bands  Trigger Point Dry Needling Treatment: Pre-treatment instruction: Patient instructed on dry needling rationale, procedures, and possible side effects including pain during treatment (achy,cramping feeling), bruising, drop of blood, lightheadedness, nausea, sweating. Patient Consent Given: Yes Education handout provided: Yes Muscles treated: L lower lumbar paraspinals and multifidi; piriformis, glut medius, glut minimus Needle size and number: .30x76mm x 1 and .30x183mm x 1 Electrical stimulation performed: No Parameters: N/A Treatment response/outcome: Twitch response elicited Post-treatment instructions: Patient instructed to expect possible mild to moderate muscle soreness later today and/or tomorrow. Patient instructed in methods to reduce muscle soreness and to continue prescribed HEP. If patient was dry needled over the lung field, patient was instructed on signs and symptoms of pneumothorax and, however unlikely, to see immediate medical attention should they occur. Patient was also educated on signs and symptoms of infection and to seek medical attention should they occur. Patient verbalized understanding of these instructions and education.  PATIENT EDUCATION:  Education details: Eval findings, POC, HEP, self care Person educated:  Patient Education  method: Explanation, Demonstration, Tactile cues, Verbal cues, and Handouts Education comprehension: verbalized understanding, returned demonstration, verbal cues required, and tactile cues required   HOME EXERCISE PROGRAM: Access Code: [email or web address] URL: [email or web address] Date: 11/20/2022 Prepared by: Joellyn Rued  Exercises - Supine Lower Trunk Rotation  - 1-2 x daily - 7 x weekly - 1 sets - 5 reps - 5 hold - Supine Piriformis Stretch with Foot on Ground  - 1-2 x daily - 7 x weekly - 3 reps - 15 hold - Hooklying Single Knee to Chest  - 1-2 x daily - 7 x weekly - 1 sets - 3 reps - 15 hold - Supine Bridge  - 1-2 x daily - 7 x weekly - 1 sets - 10 reps - 3 hold - Hooklying Clamshell with Resistance  - 1 x daily - 7 x weekly - 1 sets - 10 reps - 3 hold - Supine Hip Adduction Isometric with Ball  - 1 x daily - 7 x weekly - 1 sets - 10 reps - 3 hold - Seated Flexion Stretch with Swiss Ball  - 1 x daily - 7 x weekly - 1 sets - 6 reps - 20 hold - Seated Abdominal Press into [employer]  - 1 x daily - 7 x weekly - 1 sets - 10 reps - 3 hold - Standing Hip Flexor Stretch  - 1 x daily - 7 x weekly - 1 sets - 10 reps - 20 hold - Supine Posterior Pelvic Tilt  - 1 x daily - 7 x weekly - 1 sets - 10 reps - 3 hold - Seated March with Resistance  - 1 x daily - 7 x weekly - 3 sets - 10 reps - seated clam with resistance band  - 1 x daily - 7 x weekly - 3 sets - 10 reps - Active Straight Leg Raise with Quad Set  - 1 x daily - 7 x weekly - 2 sets - 10 reps - 3 hold - Seated Long Arc Quad  - 1 x daily - 7 x weekly - 2 sets - 10 reps - 3 hold - Supine Heel Slide with Strap  - 1 x daily - 7 x weekly - 1 sets - 3-5 reps - 30 hold - Seated Cervical Retraction  - 3 x daily - 7 x weekly - 1 sets - 5 reps - 5 hold - Seated Upper Trapezius Stretch (Mirrored)  - 2 x daily - 7 x weekly - 1 sets - 3 reps - 15 hold - Seated Cervical Rotation AROM  - 2 x daily - 7 x weekly - 1 sets - 3 reps -  15 hold - Supine Shoulder Horizontal Abduction with Resistance  - 1 x daily - 7 x weekly - 2 sets - 10 reps - Seated Shoulder Diagonal with Resistance  - 1 x daily - 7 x weekly - 2 sets - 10 reps - Supine Shoulder Flexion with Dowel  - 1 x daily - 7 x weekly - 2 sets - 10 reps    ASSESSMENT:   CLINICAL IMPRESSION: Pt reports knee pain is decreased compared to last session. She rates the pain 2/10. She has been working on cross friction massage to quad and patella tendon as well as patella mobs, feels improvement. Knee flexion AROM improved. LTG# 9 Met. She rates her hip/back pain as normal pain and shoulder pain as 2/10. She feels improvement in all areas. She has received cortisone injections and dry needling  for her shoulder which has helped. Attempted to lift 5# overhead for LTG check however patient is unable to complete this without increased left shoulder pain. Upon assessment, her shoulder flexion has improved from 120 to 125 degrees. She requests exercises for HEP however does not want to complete any more clinic visits. She is pleased with her current LOF and would like to work on her exercises at home. She was given gentle ROM and shoulder strengthening in supine for her HEP. Goals were assessed and she was found to have met most LTGs with the exception of her shoulder goals.  OBJECTIVE IMPAIRMENTS: decreased activity tolerance, decreased balance, diffculity walking, decreased ROM, decreased strength, increased muscle spasms, obesity, and pain.    ACTIVITY LIMITATIONS: carrying, lifting, bending, sitting, standing, squatting, sleeping, stairs, and locomotion level   PARTICIPATION LIMITATIONS: meal prep, cleaning, laundry, shopping, and community activity   PERSONAL FACTORS: Fitness, Past/current experiences, Time since onset of injury/illness/exacerbation, and 3+ comorbidities:    Mod limited, pulling pain R hip are also affecting patient's functional outcome.    REHAB POTENTIAL: Good    CLINICAL DECISION MAKING: Evolving/moderate complexity   EVALUATION COMPLEXITY: Moderate     GOALS:   SHORT TERM GOALS: Target date: 08/24/22   Pt will be Ind in an initial HEP  Baseline: initiated Status: proper completion Goal status: MET   2.  Pt will voice understanding of measures to assist in pain reduction Baseline: initiated Status: HEP and sleeping positions with support; use of cold pack Goal status: MET   LONG TERM GOALS: Target date: 12/07/22   Pt will be Ind in a final HEP to maintain achieved LOF Baseline: initiated Goal status: MET   2.  Pt's trunk ROM will improve to minimal or less pain for improved function and as demonstration of decreased pain Baseline: see flow sheets 10/10/22: Lumbar AROM minimally improved and with min pain -see chart 11/27/22: Lumbar AROM improved with min to no pain  Goal status: MET   3.  Pt will report a decreased in pain to 3/10 or less with daily activities Baseline: 6-8/10 Status: 09/11/22= 2/10 10/10/22: 2/10 on average Goal status: MET   4.  Pt L hip will demonstrate ROM and strength equal to that of the R hip for improved function Baseline: pain with all passive movements 10/02/22: PROM equal bilateral with min stretch , left hip weak compared to right Goal status: PARTIALLY MET   5. Improve 5xSTS by MCID of 5" and 2MWT by MCID of 7250ft as indication of improved functional mobility  Baseline: TBA STATUS= 09/18/22= 29.2" (low mat, with UE) Goal status: MET for 5xSTS and 2 MWT   6.  Pt's FOTO score will improved to the predicted value of 55% as indication of improved function  Baseline: 38% 10/05/22: 54% Goal status: Improved   7. Pt will report a decrease in L shoulder pain to 2/10 or less for improved QOL   Baseline: 5/10 11/24/22: 2/10 today , has received needling and cortisone  Goal status: MET  8. Pt will be able to lift 5# from waist height to above shoulder for functional use of her L UE Baseline: NT 11/27/22: 5# OH  press using bilat UE, causes left shoulder pain Goal status: NOT MET   9. Increase R knee flexion AROM to 105d or greater for functional mobility sitting and negotiating steps Baseline: 95d 11/24/22: 105  Goal status: MET  10.Increase R hip and knee strength to 4+/5  for improved functional mobility  Baseline: See flow sheets 11/27/22: 4+/5 Goal status: MET  PLAN:   PT FREQUENCY: 2x/week   PT DURATION: 6 weeks   PLANNED INTERVENTIONS: Therapeutic exercises, Therapeutic activity, Neuromuscular re-education, Balance training, Gait training, Patient/Family education, Self Care, Joint mobilization, Stair training, Aquatic Therapy, Dry Needling, Electrical stimulation, Spinal manipulation, Spinal mobilization, Cryotherapy, Moist heat, Taping, Traction, Ultrasound, Ionotophoresis 4mg /ml Dexamethasone, Manual therapy, and Re-evaluation.   PLAN FOR NEXT SESSION: ; N/A DC to HEP today  Jannette Spanner, PTA 11/27/22 12:54 PM Phone: [phone] Fax: [phone]   PHYSICAL THERAPY DISCHARGE SUMMARY  Visits from Start of Care: 21  Current functional level related to goals / functional outcomes: See clinical impression and PT goals    Remaining deficits: See clinical impression and PT goals    Education / Equipment: HEP   Patient agrees to discharge. Patient goals were  majority of goals met or improved . Patient is being discharged due to being pleased with the current functional level.  Allen Ralls MS, PT 12/04/22 2:20 PM

## 2022-11-27 NOTE — Addendum Note (Signed)
Addended by: Jethro Bastos, Zarif Rathje I on: 11/27/2022 10:42 AM   Modules accepted: Orders

## 2022-11-28 ENCOUNTER — Ambulatory Visit (INDEPENDENT_AMBULATORY_CARE_PROVIDER_SITE_OTHER): Payer: Medicare Other

## 2022-11-28 DIAGNOSIS — I428 Other cardiomyopathies: Secondary | ICD-10-CM

## 2022-11-28 LAB — SPECIMEN STATUS REPORT

## 2022-11-28 LAB — ACETAMINOPHEN LEVEL: Acetaminophen (Tylenol), S: NOT DETECTED ug/mL (ref 10–30)

## 2022-11-28 NOTE — Telephone Encounter (Signed)
Received a fax regarding Prior Authorization from [facility] for ESBRIET. Authorization has been DENIED because patient must try and fail or have medical reason they cannot take all of the preferred options: pirfenidone tablet, pirfenidone capsule, and Ofev.  Submitted a Prior Authorization request to [facility] for PIRFENIDONE via CoverMyMeds. Will update once we receive a response.  Key: [id number]  Chesley Mires, PharmD, MPH, BCPS, CPP Clinical Pharmacist (Rheumatology and Pulmonology)

## 2022-11-28 NOTE — Telephone Encounter (Deleted)
Received a fax regarding Prior Authorization from [facility] for ESBRIET. Authorization has been DENIED for step-therapy requirement which makes the pt currently ineligible for PAP.

## 2022-11-30 ENCOUNTER — Other Ambulatory Visit (HOSPITAL_COMMUNITY): Payer: Self-pay

## 2022-11-30 ENCOUNTER — Telehealth: Payer: Self-pay | Admitting: Internal Medicine

## 2022-11-30 ENCOUNTER — Ambulatory Visit: Payer: Medicare Other | Admitting: Physical Therapy

## 2022-11-30 DIAGNOSIS — J849 Interstitial pulmonary disease, unspecified: Secondary | ICD-10-CM

## 2022-11-30 NOTE — Telephone Encounter (Signed)
Called and spoke with pt letting her know the results of bloodwork and she verbalized understanding. Labwork placed. Nothing further needed.

## 2022-11-30 NOTE — Telephone Encounter (Signed)
Tylenol level normal. This is good news but hope she is still holding the tylenol.  sHe needs recheck LFT early next week

## 2022-11-30 NOTE — Telephone Encounter (Signed)
Received notification from [facility] regarding a prior authorization for PIRFENIDONE. Authorization has been APPROVED from 11/28/22 to 08/20/23. Approval letter sent to scan center.  Per test claim, copay for 30 days supply is $162.52  Patient can fill through [facility]: [phone]   Phone # [phone]  Chesley Mires, PharmD, MPH, BCPS, CPP Clinical Pharmacist (Rheumatology and Pulmonology)

## 2022-12-03 LAB — CUP PACEART REMOTE DEVICE CHECK
Battery Remaining Longevity: 12 mo
Battery Remaining Percentage: 14 %
Brady Statistic RV Percent Paced: 0 %
Date Time Interrogation Session: 20240413214400
HighPow Impedance: 76 Ohm
Implantable Lead Connection Status: 753985
Implantable Lead Implant Date: 20050603
Implantable Lead Location: 753860
Implantable Lead Model: 185
Implantable Lead Serial Number: 116340
Implantable Pulse Generator Implant Date: 20110505
Lead Channel Impedance Value: 597 Ohm
Lead Channel Pacing Threshold Amplitude: 0.8 V
Lead Channel Pacing Threshold Pulse Width: 0.4 ms
Lead Channel Setting Pacing Amplitude: 2.4 V
Lead Channel Setting Pacing Pulse Width: 0.4 ms
Lead Channel Setting Sensing Sensitivity: 0.4 mV
Pulse Gen Serial Number: 266301
Zone Setting Status: 755011

## 2022-12-04 ENCOUNTER — Ambulatory Visit: Payer: Medicare Other

## 2022-12-05 NOTE — Telephone Encounter (Signed)
I spoke with patient regarding the copay. She inquired if there was anyway to get it lower than this. I advised that since it is a specialty medication we have not had success lowering tier to help with cost. Additionally there are no grants open for the medication/ Her insurance did not approved brand name Esbriet and therefore she will not qualify for patient assistance program (since the denial for brand name stated she must try generic pirfenidone first)  She is planning to have LFTs repeated this Friday, 12/07/22  Chesley Mires, PharmD, MPH, BCPS, CPP Clinical Pharmacist (Rheumatology and Pulmonology)

## 2022-12-07 ENCOUNTER — Other Ambulatory Visit (INDEPENDENT_AMBULATORY_CARE_PROVIDER_SITE_OTHER): Payer: Medicare Other

## 2022-12-07 DIAGNOSIS — J849 Interstitial pulmonary disease, unspecified: Secondary | ICD-10-CM | POA: Diagnosis not present

## 2022-12-07 LAB — HEPATIC FUNCTION PANEL
ALT: 30 U/L (ref 0–35)
AST: 30 U/L (ref 0–37)
Albumin: 4 g/dL (ref 3.5–5.2)
Alkaline Phosphatase: 145 U/L — ABNORMAL HIGH (ref 39–117)
Bilirubin, Direct: 0.1 mg/dL (ref 0.0–0.3)
Total Bilirubin: 0.3 mg/dL (ref 0.2–1.2)
Total Protein: 7.3 g/dL (ref 6.0–8.3)

## 2022-12-12 ENCOUNTER — Encounter: Payer: Medicare Other | Admitting: Physical Medicine and Rehabilitation

## 2022-12-12 ENCOUNTER — Other Ambulatory Visit: Payer: Self-pay | Admitting: Physical Medicine and Rehabilitation

## 2022-12-14 ENCOUNTER — Other Ambulatory Visit: Payer: Self-pay | Admitting: *Deleted

## 2022-12-14 DIAGNOSIS — E038 Other specified hypothyroidism: Secondary | ICD-10-CM

## 2022-12-14 MED ORDER — LEVOTHYROXINE SODIUM 50 MCG PO TABS: 50.0000 ug | ORAL_TABLET | Freq: Every day | ORAL | 0 refills | Status: DC

## 2022-12-26 ENCOUNTER — Ambulatory Visit: Payer: Medicare Other | Admitting: Family

## 2022-12-26 ENCOUNTER — Ambulatory Visit (INDEPENDENT_AMBULATORY_CARE_PROVIDER_SITE_OTHER): Payer: Medicare Other | Admitting: Family Medicine

## 2022-12-26 VITALS — BP 129/79 | HR 76 | Temp 98.7°F | Resp 16 | Wt 227.4 lb

## 2022-12-26 DIAGNOSIS — E785 Hyperlipidemia, unspecified: Secondary | ICD-10-CM | POA: Diagnosis not present

## 2022-12-26 DIAGNOSIS — I10 Essential (primary) hypertension: Secondary | ICD-10-CM

## 2022-12-26 DIAGNOSIS — E039 Hypothyroidism, unspecified: Secondary | ICD-10-CM

## 2022-12-26 NOTE — Progress Notes (Signed)
Patient is here for their 6 month follow-up Patient has no concerns today Care gaps have been discussed with patient  

## 2022-12-27 NOTE — Telephone Encounter (Signed)
Patient approved for Pulmonary fibrosis grant through [employer]  Effective 12/27/22 through 12/27/2023 with balance of $5500 ID: [id number] BIN: [id number] PCN: [id number] Group: [id number] For pharmacy inquiries, contact PDMI at [phone]. For patient inquiries, contact PAF at [phone].  I spoke with patient regarding pirfenidone.  Chesley Mires, PharmD, MPH, BCPS, CPP Clinical Pharmacist (Rheumatology and Pulmonology)

## 2022-12-28 NOTE — Telephone Encounter (Signed)
Ok to restart pirfenidone

## 2022-12-30 ENCOUNTER — Other Ambulatory Visit: Payer: Self-pay | Admitting: Family Medicine

## 2022-12-30 DIAGNOSIS — E038 Other specified hypothyroidism: Secondary | ICD-10-CM

## 2022-12-31 NOTE — Telephone Encounter (Signed)
Unable to refill per protocol, Rx request is too soon. Last refill 12/14/22 for 90 days.  Requested Prescriptions  Pending Prescriptions Disp Refills   levothyroxine (SYNTHROID) 50 MCG tablet [Pharmacy Med Name: LEVOTHYROXINE SODIUM 50 MCG Tablet] 90 tablet 3    Sig: TAKE 1 TABLET EVERY DAY     Endocrinology:  Hypothyroid Agents Passed - 12/30/2022  2:42 AM      Passed - TSH in normal range and within 360 days    TSH  Date Value Ref Range Status  09/12/2022 3.100 0.450 - 4.500 uIU/mL Final         Passed - Valid encounter within last 12 months    Recent Outpatient Visits           5 days ago Essential hypertension   [place] [facility], MD   4 months ago History of breast cancer   [place] [facility], Amy J, NP   5 months ago Left-sided low back pain without sciatica, unspecified chronicity   [place] [facility], MD   6 months ago Prediabetes   [facility] [place], NP   9 months ago Neuropathic pain   [place] [facility], [name], NP       Future Appointments             In 3 days Kalman Shan, MD [facility] at [place]   In 2 months Pollyann Savoy, MD [facility]   In 4 months Eldridge Dace, Donnie Coffin, MD [facility] at [facility]   In 6 months Georganna Skeans, MD [facility] at [facility]

## 2023-01-01 ENCOUNTER — Encounter: Payer: Self-pay | Admitting: Family Medicine

## 2023-01-01 ENCOUNTER — Ambulatory Visit: Payer: Self-pay | Admitting: Internal Medicine

## 2023-01-01 NOTE — Progress Notes (Signed)
Established Patient Office Visit  Subjective    Patient ID: [name], female    DOB: [birth year]  Age: 72 y.o. MRN: [id number]  CC:  Chief Complaint  Patient presents with   Follow-up    HPI [name] presents for routine follow up of chronic med issues. Patient denies acute complaints or concerns.    Outpatient Encounter Medications as of 12/26/2022  Medication Sig   acetaminophen (TYLENOL) 500 MG tablet Take 500 mg by mouth every 6 (six) hours as needed for moderate pain.   acetaminophen-codeine (TYLENOL/CODEINE #3) 300-30 MG tablet Take 1 tablet by mouth every 8 (eight) hours as needed for moderate pain.   albuterol (PROVENTIL) (2.5 MG/3ML) 0.083% nebulizer solution USE 1 VIAL IN NEBULIZER EVERY 6 HOURS (Patient taking differently: Take 2.5 mg by nebulization every 6 (six) hours as needed for shortness of breath.)   albuterol (VENTOLIN HFA) 108 (90 Base) MCG/ACT inhaler Inhale 2 puffs into the lungs every 6 (six) hours as needed for wheezing or shortness of breath.   Ascorbic Acid (VITAMIN C) 1000 MG tablet Take 1,000 mg by mouth daily.   aspirin EC 81 MG tablet Take 81 mg by mouth daily. Swallow whole.   azelastine (ASTELIN) 0.1 % nasal spray Place 1 spray into both nostrils 2 (two) times daily. Use in each nostril as directed   budesonide-formoterol (SYMBICORT) 80-4.5 MCG/ACT inhaler Inhale 1 puff into the lungs 2 (two) times daily.   cefUROXime (CEFTIN) 500 MG tablet Take 1 tablet (500 mg total) by mouth 2 (two) times daily with a meal.   cetirizine (ZYRTEC) 10 MG tablet Take 10 mg by mouth daily as needed for allergies.   Cholecalciferol (DIALYVITE VITAMIN D 5000) 125 MCG (5000 UT) capsule Take 5,000 Units by mouth daily.   cloBAZam (ONFI) 10 MG tablet Take 1 tablet (10 mg total) by mouth 2 (two) times daily.   diltiazem (CARDIZEM CD) 120 MG 24 hr capsule Take 1 capsule (120 mg total) by mouth daily.   empagliflozin (JARDIANCE) 10 MG TABS tablet Take 1 tablet (10  mg total) by mouth daily.   Evolocumab (REPATHA SURECLICK) 140 MG/ML SOAJ Inject 140 mg into the skin every 14 (fourteen) days.   famotidine (PEPCID) 40 MG tablet Take 1 tablet (40 mg total) by mouth at bedtime.   fexofenadine (ALLEGRA) 180 MG tablet Take 180 mg by mouth daily as needed for allergies (alternates with zyrtec sometimes).   fluticasone (CUTIVATE) 0.05 % cream Apply topically 2 (two) times daily.   fluticasone (FLONASE) 50 MCG/ACT nasal spray Place 2 sprays into both nostrils daily as needed for allergies or rhinitis.   FOLIC ACID PO Take 1 tablet by mouth daily.   furosemide (LASIX) 20 MG tablet TAKE 2 TABLETS (40 MG) 3 DAYS PER WEEK AND TAKE 1 TABLET (20 MG) THE OTHER DAYS OF THE WEEK   gabapentin (NEURONTIN) 100 MG capsule Take 1 capsule (100 mg total) by mouth 3 (three) times daily.   gabapentin (NEURONTIN) 300 MG capsule Take 1 capsule (300 mg total) by mouth at bedtime.   hydroxypropyl methylcellulose / hypromellose (ISOPTO TEARS / GONIOVISC) 2.5 % ophthalmic solution Place 1 drop into both eyes at bedtime.   latanoprost (XALATAN) 0.005 % ophthalmic solution Place 1 drop into both eyes at bedtime.   levothyroxine (SYNTHROID) 50 MCG tablet Take 1 tablet (50 mcg total) by mouth daily.   magnesium oxide (MAG-OX) 400 MG tablet Take 1 tablet (400 mg total) by mouth 2 (two)  times daily.   mometasone (ELOCON) 0.1 % lotion Apply 1 application topically daily as needed (rash).   montelukast (SINGULAIR) 10 MG tablet TAKE 1 TABLET AT BEDTIME   Multiple Vitamins-Minerals (MULTIVITAMIN PO) Take 1 tablet by mouth daily.   nitroGLYCERIN (NITROSTAT) 0.4 MG SL tablet DISSOLVE ONE TABLET UNDER THE TONGUE EVERY 5 MINUTES AS NEEDED FOR CHEST PAIN.  DO NOT EXCEED A TOTAL OF 3 DOSES IN 15 MINUTES (Patient taking differently: Place 0.4 mg under the tongue every 5 (five) minutes as needed for chest pain.)   nystatin (MYCOSTATIN) 100000 UNIT/ML suspension Take 5 mLs (500,000 Units total) by mouth 4  (four) times daily.   pantoprazole (PROTONIX) 40 MG tablet TAKE 1 TABLET EVERY DAY   polyethylene glycol powder (GLYCOLAX/MIRALAX) powder Take 17 grams by mouth daily   potassium chloride (KLOR-CON M) 10 MEQ tablet TAKE 1/2 TABLET EVERY DAY (CALL OFFICE TO SCHEDULE YEARLY APPT)   Respiratory Therapy Supplies (FLUTTER) DEVI 1 Device by Does not apply route as needed.   sodium chloride HYPERTONIC 3 % nebulizer solution Take by nebulization 2 (two) times daily as needed for cough. 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   tiZANidine (ZANAFLEX) 4 MG tablet Take 0.5-1 tablets (2-4 mg total) by mouth every 8 (eight) hours as needed for muscle spasms.   traZODone (DESYREL) 100 MG tablet TAKE 1 TO 1 AND 1/2 TABLETS AT BEDTIME   triamcinolone cream (KENALOG) 0.1 % Apply 1 Application topically 2 (two) times daily.   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   Bronchiectasis (HCC)    Cerebral aneurysm, nonruptured    had a clip put in   CHF (congestive heart failure) (HCC)    Clostridium difficile infection    Depressive disorder, not elsewhere classified    Diverticulosis of colon (without mention of hemorrhage)    Esophageal reflux    Fatty liver    Fibromyalgia    Gastritis    GERD (gastroesophageal reflux disease)    GI bleed 2004   Glaucoma    Hiatal hernia    History of COVID-19 05/04/2021   Hyperlipidemia    Hypertension    Hypothyroidism    Internal hemorrhoids    Joint pain    Multifocal pneumonia 06/03/2021   Obstructive sleep apnea (adult) (pediatric)    Osteoarthritis    Ostium secundum type  atrial septal defect    Other chronic nonalcoholic liver disease    Other pulmonary embolism and infarction    Palpitations    Paroxysmal ventricular tachycardia (HCC)    Personal history of radiation therapy    PONV (postoperative nausea and vomiting)    Presence of permanent cardiac pacemaker    PUD (peptic ulcer disease)    Radiation 02/03/15-03/10/15   Right Breast   Sarcoid    per pt , not sure   Schatzki's ring    Shortness of breath    Sleep apnea    Stroke [facility]) 2013   tia/ pt feels it was around 2008 0r 2009   Takotsubo syndrome    Tubular adenoma of colon  Unspecified transient cerebral ischemia    Unspecified vitamin D deficiency     Past Surgical History:  Procedure Laterality Date   ABI  2006   normal   BREAST EXCISIONAL BIOPSY     BREAST LUMPECTOMY Right 2016   BREAST LUMPECTOMY WITH RADIOACTIVE SEED LOCALIZATION Right 01/03/2015   Procedure: BREAST LUMPECTOMY WITH RADIOACTIVE SEED LOCALIZATION;  Surgeon: Chevis Pretty III, MD;  Location: [facility] OR;  Service: General;  Laterality: Right;   CARDIAC DEFIBRILLATOR PLACEMENT  2006; 2012   BSX single chamber ICD   carotid dopplers  2006   neg   CEREBRAL ANEURYSM REPAIR  02/1999   COLONOSCOPY     HAMMER TOE SURGERY  11/15/2020   3rd digit bilateral feet    hospitalization  2004   GI bleed, PUD, diverticulosis (EGD,colonscopy)   hospitalization     PE, NSVT, s/p defib   KNEE ARTHROSCOPY     bilateral   LEFT HEART CATHETERIZATION WITH CORONARY ANGIOGRAM N/A 02/22/2012   Procedure: LEFT HEART CATHETERIZATION WITH CORONARY ANGIOGRAM;  Surgeon: Kathleene Hazel, MD;  Location: [facility] CATH LAB;  Service: Cardiovascular;  Laterality: N/A;   PARTIAL HYSTERECTOMY     Fibroids   TONGUE BIOPSY  12/12/2017   due to sore tongue and white patches/abnormal cells   TOTAL KNEE ARTHROPLASTY Left 02/13/2016   Procedure: TOTAL KNEE ARTHROPLASTY;  Surgeon: Dannielle Huh, MD;  Location: [facility] OR;  Service: Orthopedics;  Laterality: Left;    TOTAL KNEE ARTHROPLASTY Right 05/22/2021   Procedure: TOTAL KNEE ARTHROPLASTY;  Surgeon: Dannielle Huh, MD;  Location: [facility] ORS;  Service: Orthopedics;  Laterality: Right;  with block   UPPER GASTROINTESTINAL ENDOSCOPY      Family History  Problem Relation Age of Onset   Diabetes Mother    Alzheimer's disease Mother    Hypertension Mother    Obesity Mother    Heart disease Father    Seizures Sister    Allergies Sister    Heart disease Sister    Other Brother        Thyroid problem 10/2016   Heart disease Brother    Prostate cancer Brother 43       same brother as throat cancer   Throat cancer Brother        dx in his 67s; also a smoker   Cancer Maternal Grandmother 67       colon cancer or abdominal cancer   Lung cancer Maternal Grandfather 64   Heart disease Son        Cardiac Arrest 07/2016   Breast cancer Maternal Aunt 55   Colon cancer Maternal Aunt 51       same sister as breast at 15   Breast cancer Maternal Aunt        dx in her 62s   Lung cancer Maternal Uncle    Pancreatic cancer Cousin 61       maternal first cousin   Breast cancer Cousin        paternal first cousin twice removed died in her 30s   Stroke Neg Hx     Social History   Socioeconomic History   Marital status: Divorced    Spouse name: Not on file   Number of children: 2   Years of education: 14   Highest education level: Associate degree: occupational, Scientist, product/process development, or vocational program  Occupational History   Occupation: DISABLED  Tobacco Use   Smoking status: Former    Packs/day: 1.00    Years: 15.00  Additional pack years: 0.00    Total pack years: 15.00    Types: Cigarettes    Quit date: 08/20/1989    Years since quitting: 33.3    Passive exposure: Past   Smokeless tobacco: Never  Vaping Use   Vaping Use: Never used  Substance and Sexual Activity   Alcohol use: Yes    Alcohol/week: 3.0 standard drinks of alcohol    Types: 3 Glasses of wine per week    Comment: occasionally, not  on a weekly basis   Drug use: Not Currently    Frequency: 1.0 times per week    Comment: CBD gummy occ; marijuana occasionally   Sexual activity: Not Currently  Other Topics Concern   Not on file  Social History Narrative   Lives alone   Caffeine ZOX:WRUE   Retired from [employer]   2 children -- 5 grandbabies - 1 great-grandbaby    Social Determinants of Health   Financial Resource Strain: Medium Risk (12/25/2022)   Overall Financial Resource Strain (CARDIA)    Difficulty of Paying Living Expenses: Somewhat hard  Food Insecurity: No Food Insecurity (12/25/2022)   Hunger Vital Sign    Worried About Running Out of Food in the Last Year: Never true    Ran Out of Food in the Last Year: Never true  Transportation Needs: Unknown (12/25/2022)   PRAPARE - Transportation    Lack of Transportation (Medical): No    Lack of Transportation (Non-Medical): Patient declined  Physical Activity: Unknown (12/25/2022)   Exercise Vital Sign    Days of Exercise per Week: Patient declined    Minutes of Exercise per Session: Not on file  Stress: Stress Concern Present (12/25/2022)   [employer] of Occupational Health - Occupational Stress Questionnaire    Feeling of Stress : To some extent  Social Connections: Unknown (12/25/2022)   Social Connection and Isolation Panel [NHANES]    Frequency of Communication with Friends and Family: More than three times a week    Frequency of Social Gatherings with Friends and Family: Patient declined    Attends Religious Services: Patient declined    Database administrator or Organizations: Yes    Attends Banker Meetings: Patient declined    Marital Status: Divorced  Catering manager Violence: Not At Risk (12/08/2020)   Humiliation, Afraid, Rape, and Kick questionnaire    Fear of Current or Ex-Partner: No    Emotionally Abused: No    Physically Abused: No    Sexually Abused: No    Review of Systems  All other systems reviewed and are  negative.       Objective    BP 129/79   Pulse 76   Temp 98.7 F (37.1 C) (Oral)   Resp 16   Wt 227 lb 6.4 oz (103.1 kg)   LMP  (LMP Unknown)   SpO2 95%   BMI 36.15 kg/m   Physical Exam Vitals and nursing note reviewed.  Constitutional:      General: She is not in acute distress. Cardiovascular:     Rate and Rhythm: Normal rate and regular rhythm.  Pulmonary:     Effort: Pulmonary effort is normal.     Breath sounds: Normal breath sounds.  Abdominal:     Palpations: Abdomen is soft.     Tenderness: There is no abdominal tenderness.  Neurological:     General: No focal deficit present.     Mental Status: She is alert and oriented to person, place, and  time.         Assessment & Plan:   1. Essential hypertension Appears stable. Continue   2. Hypothyroidism, unspecified type Continue   3. Hyperlipidemia, unspecified hyperlipidemia type  Continue     Return in about 6 months (around 06/28/2023) for follow up.   Tommie Raymond, MD

## 2023-01-03 ENCOUNTER — Telehealth: Payer: Self-pay | Admitting: Internal Medicine

## 2023-01-03 ENCOUNTER — Telehealth: Payer: Self-pay | Admitting: Family Medicine

## 2023-01-03 ENCOUNTER — Ambulatory Visit (INDEPENDENT_AMBULATORY_CARE_PROVIDER_SITE_OTHER): Payer: Medicare Other | Admitting: Internal Medicine

## 2023-01-03 ENCOUNTER — Encounter: Payer: Self-pay | Admitting: Internal Medicine

## 2023-01-03 VITALS — BP 130/70 | HR 85 | Ht 66.5 in | Wt 225.2 lb

## 2023-01-03 DIAGNOSIS — J84112 Idiopathic pulmonary fibrosis: Secondary | ICD-10-CM | POA: Diagnosis not present

## 2023-01-03 DIAGNOSIS — R0609 Other forms of dyspnea: Secondary | ICD-10-CM | POA: Diagnosis not present

## 2023-01-03 DIAGNOSIS — R7989 Other specified abnormal findings of blood chemistry: Secondary | ICD-10-CM | POA: Diagnosis not present

## 2023-01-03 LAB — HEPATIC FUNCTION PANEL
ALT: 38 U/L — ABNORMAL HIGH (ref 0–35)
AST: 41 U/L — ABNORMAL HIGH (ref 0–37)
Albumin: 4 g/dL (ref 3.5–5.2)
Alkaline Phosphatase: 125 U/L — ABNORMAL HIGH (ref 39–117)
Bilirubin, Direct: 0.1 mg/dL (ref 0.0–0.3)
Total Bilirubin: 0.3 mg/dL (ref 0.2–1.2)
Total Protein: 7.3 g/dL (ref 6.0–8.3)

## 2023-01-03 NOTE — Addendum Note (Signed)
Addended by: Hedda Slade on: 01/03/2023 02:27 PM   Modules accepted: Orders

## 2023-01-03 NOTE — Telephone Encounter (Signed)
Checking LFT 01/03/2023 - if ok she is ready to start esbriet

## 2023-01-03 NOTE — Telephone Encounter (Signed)
Contacted [name] to schedule their annual wellness visit. Appointment made for 01/15/23.  [name] AWV direct phone # [phone]

## 2023-01-03 NOTE — Progress Notes (Signed)
OV 08/23/2022 -refer by Dr. Craige Cotta to Dr. [name] at the pulmonary fibrosis center.  Subjective:  Patient ID: [name], female , DOB: 1951-02-06 , age 72 y.o. , MRN: [id number] , ADDRESS: [address] [place] [address] PCP Georganna Skeans, MD Patient Care Team: Georganna Skeans, MD as PCP - General (Family Medicine) Duke Salvia, MD as PCP - Electrophysiology (Cardiology) Corky Crafts, MD as PCP - Cardiology (Cardiology) Griselda Miner, MD as Consulting Physician (General Surgery) Malachy Mood, MD as Consulting Physician (Hematology) Lurline Hare, MD as Consulting Physician (Radiation Oncology) Donnelly Angelica, RN as Registered Nurse Pershing Proud, RN as Registered Nurse Duke Salvia, MD as Consulting Physician (Cardiology) Hubbard Hartshorn, NP (Inactive) as Nurse Practitioner (Nurse Practitioner) [name], NP as Nurse Practitioner (Nurse Practitioner) Coralyn Helling, MD as Consulting Physician (Pulmonary Disease) Griselda Miner, MD as Consulting Physician (General Surgery) Pollyann Savoy, MD as Consulting Physician (Rheumatology)  This Provider for this visit: Treatment Team:  Attending Provider: Kalman Shan, MD    08/23/2022 -   Chief Complaint  Patient presents with   Consult    Referred for ILD, states DOE     HPI [facility] 72 y.o. -referred by Dr. Craige Cotta to Dr. [name] the fibrosis center.  History is gained by review of the chart, talking to the patient in the ILD questionnaire that the patient fell.  There is no one accompanying the patient.  She has been following with Dr. Craige Cotta at least for the last 3 to 4 years for a diagnosis of bronchiectasis.  She says at baseline she is doing fairly well but then in October 2022 she suffered from COVID-19 and then was hospitalized.  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   Then a few weeks later on 06/03/2021 she was hospitalized for worsening respiratory failure which she believes was because of COVID-19 but review the records indicate that she actually had COVID in September 2022 and was treated with Paxlovid as an outpatient.  At this hospitalization the COVID was negative.  She was actually hypoxemic.  Pulmonary embolism was ruled out.  This admission RVP and COVID were normal.  Procalcitonin was normal.  Final diagnosis was acute on chronic hypoxemic respiratory failure due to bronchiectasis and pulmonary fibrosis.  She says since then she has been gradually weaned of oxygen.  She says overall she is better since the hospitalization October 2022 but she is not back to her baseline from before the hospitalization.  She has dyspnea on exertion.  Most recently November 2023 Dr. Craige Cotta did high-resolution CT scan of the chest that is concerning for honeycombing findings and progressive changes [personally visualized and I do not fully agree].  Therefore she has been referred here.  Pulmonary function testing shows still blood in DLCO but the FVC seems reduced recently.   All the question is as follows  Most recently she is use 2 L of oxygen with exertion.   [facility] Comprehensive ILD Questionnaire  Symptoms:  Other than dyspnea she also has a cough.  The cough is a since December 2023.  Although currently it is not there.   Past Medical History :  -She marked positive for COPD according to history but does not not be correct. -She is have OSA for which she is previous Dr. Craige Cotta - She does have a longstanding  history of acid reflux/hiatal hernia No history of tuberculosis -She has a history of breast cancer with radiation to the right breast in 2018 -Per the records she suffers from chronic pain syndrome with a history of right brain aneurysm clipping 2001 and cervical stenosis. -She has acid reflux disease for which she sees Dr. Rhea Belton -She has overactive  bladder -She has nonischemic cardiomyopathy for which she sees Dr. Graciela Husbands  -EF 35-40% in 2013 but as high as 50-55% in December 2022   ROS:  She has longstanding chronic pain and gait abnormality  FAMILY HISTORY of LUNG DISEASE:  She has arthralgia - She does have dry eyes -She does have heartburn -Denies family history of lung disease  PERSONAL EXPOSURE HISTORY:  -Denies family history of lung disease -She smoked between 1966 in 91 1/2 pack/day.  No marijuana no cocaine no vaping  HOME  EXPOSURE and HOBBY DETAILS :  -Her current home was built in 1973 and she has been in this house since the last 25 years.  She does use a CPAP mask.  She does use a nebulizer machine.  She does do some occasional gardening but otherwise detailed organic antigen history is negative.  OCCUPATIONAL HISTORY (122 questions) : She is to work for [employer] and then got disabled and then retired.  Detail organic and inorganic antigen history exposure at work is negative.  PULMONARY TOXICITY HISTORY (27 items):  -Did get rid Dacian to the right breast in 2018  INVESTIGATIONS: -     CT Chest data - HRCT Nov 2023 -personally visualized.  Not fully sure if that is actually UIP or not.  If that is UIP then that negative prognostic marker.  I am also not sure about progression.  With this allowed to be clarified with multidisciplinary case conference.   Narrative & Impression  CLINICAL DATA:  72 year old female with history of shortness of breath. Evaluate for interstitial lung disease.   EXAM: CT CHEST WITHOUT CONTRAST   TECHNIQUE: Multidetector CT imaging of the chest was performed following the standard protocol without intravenous contrast. High resolution imaging of the lungs, as well as inspiratory and expiratory imaging, was performed.   RADIATION DOSE REDUCTION: This exam was performed according to the departmental dose-optimization program which includes automated exposure control,  adjustment of the mA and/or kV according to patient size and/or use of iterative reconstruction technique.   COMPARISON:  CTA of the chest 08/30/2021.   FINDINGS: Cardiovascular: Heart size is normal. There is no significant pericardial fluid, thickening or pericardial calcification. There is aortic atherosclerosis, as well as atherosclerosis of the great vessels of the mediastinum and the coronary arteries, including calcified atherosclerotic plaque in the left anterior descending coronary artery. Left-sided pacemaker/AICD device in place with lead tip terminating in the right ventricular apex.   Mediastinum/Nodes: No pathologically enlarged mediastinal or hilar lymph nodes. Please note that accurate exclusion of hilar adenopathy is limited on noncontrast CT scans. Esophagus is unremarkable in appearance. No axillary lymphadenopathy.   Lungs/Pleura: High-resolution images demonstrate widespread but patchy areas of ground-glass attenuation, septal thickening, subpleural reticulation, parenchymal banding, traction bronchiectasis, peripheral bronchiolectasis and extensive honeycombing. These findings have a definitive craniocaudal gradient and are progressive compared to the prior examination from 08/30/2021. Inspiratory and expiratory imaging is unremarkable. No acute confluent consolidative airspace disease. No pleural effusions. No definite suspicious appearing pulmonary nodules or masses are noted.   Upper Abdomen: Aortic atherosclerosis. Colonic diverticulosis noted in the splenic flexure of the colon.   Musculoskeletal:  There are no aggressive appearing lytic or blastic lesions noted in the visualized portions of the skeleton.   IMPRESSION: 1. 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 2. Aortic atherosclerosis, in addition to left anterior  descending coronary artery disease. Assessment for potential risk factor modification, dietary therapy or pharmacologic therapy may be warranted, if clinically indicated. 3. Colonic diverticulosis without evidence of acute diverticulitis at this time.   Aortic Atherosclerosis (ICD10-I70.0).     Electronically Signed   By: Trudie Reed M.D.   On: 07/08/2022 10:54     OV 11/22/2022  Subjective:  Patient ID: [name], female , DOB: [birth year] , age 17 y.o. , MRN: [id number] , ADDRESS: [address] [place] [address] PCP Georganna Skeans, MD Patient Care Team: Georganna Skeans, MD as PCP - General (Family Medicine) Duke Salvia, MD as PCP - Electrophysiology (Cardiology) Corky Crafts, MD as PCP - Cardiology (Cardiology) Griselda Miner, MD as Consulting Physician (General Surgery) Malachy Mood, MD as Consulting Physician (Hematology) Lurline Hare, MD as Consulting Physician (Radiation Oncology) Donnelly Angelica, RN as Registered Nurse Pershing Proud, RN as Registered Nurse Duke Salvia, MD as Consulting Physician (Cardiology) Hubbard Hartshorn, NP (Inactive) as Nurse Practitioner (Nurse Practitioner) [name], NP as Nurse Practitioner (Nurse Practitioner) Coralyn Helling, MD as Consulting Physician (Pulmonary Disease) Griselda Miner, MD as Consulting Physician (General Surgery) Pollyann Savoy, MD as Consulting Physician (Rheumatology)  This Provider for this visit: Treatment Team:  Attending Provider: Kalman Shan, MD    11/22/2022 -   Chief Complaint  Patient presents with   Follow-up    F/u pft 11/15/22. Labs,      HPI Taraoluwa Entz 72 y.o. -returns for follow-up.  This is potential ILD workup in progress.  She states in the interim her son who is age 49 did have a heart transplant for congestive heart failure 3 weeks ago at [facility] he is home and is improving.  She is happy about that.  She also  reports that after her COVID she was prescribed Symbicort she is run out of the Symbicort for the last 2 weeks but this is not really bothering her.  She feels stable she has occasional cough.  Be here to review the results.  She had pulmonary function test that shows now a consistent pattern of decline her FVC is declined 9% in the last 4 years.  I went back and visualized all her CT scans of the chest.  Her CT scan in 2013 and a high-resolution CT chest in 2020 look about similar with some bronchiectasis.  However there is dramatic change between 2020 and also the high-resolution CT chest and late 2023.  This seems a lot more traction bronchiectasis.  There is definite craniocaudal gradient.  She has crackles here.  Initially I was not sure if this was classic UIP but clinically this makes sense especially of the lower lobe crackles.  Was supposed to discuss this in the case conference but this did not happen.  Every posted for discussion of the case conference but nevertheless his decline in PFTs and also this change now in the CT scan of the chest.  In the interim his serologies are normal.  I think at the least she has some plan for progressive ILD .  Possible is IPF but I would like to confirm this and discussion of  the case conference.  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  Went over the 2 antifibrotic's nintedanib and pirfenidone.  Shows pirfenidone because of the diarrhea side effects with nintedanib.  She has knee pain and be hard to get to the toilet.  In addition she has nonischemic cardiomyopathy and this blackbox warning for MI risk with with nintedanib.  [Even though she does not have ischemic cardiomyopathy].  Told her that pirfenidone associated more with weight loss nausea anorexia.  Both drugs required liver function monitoring.  Generally both drugs have reversible side effects.  In balance which was pirfenidone.  I  did note mild transaminitis in the past will check liver function test again today.   Xxxxxxxxx[id number]      OV 01/03/2023  Subjective:  Patient ID: [name], female , DOB: Jan 07, 1951 , age 1 y.o. , MRN: [id number] , ADDRESS: [address] [place] [address] PCP Georganna Skeans, MD Patient Care Team: Georganna Skeans, MD as PCP - General (Family Medicine) Duke Salvia, MD as PCP - Electrophysiology (Cardiology) Corky Crafts, MD as PCP - Cardiology (Cardiology) Griselda Miner, MD as Consulting Physician (General Surgery) Malachy Mood, MD as Consulting Physician (Hematology) Lurline Hare, MD as Consulting Physician (Radiation Oncology) Donnelly Angelica, RN as Registered Nurse Pershing Proud, RN as Registered Nurse Duke Salvia, MD as Consulting Physician (Cardiology) Hubbard Hartshorn, NP (Inactive) as Nurse Practitioner (Nurse Practitioner) [name], NP as Nurse Practitioner (Nurse Practitioner) Coralyn Helling, MD as Consulting Physician (Pulmonary Disease) Griselda Miner, MD as Consulting Physician (General Surgery) Pollyann Savoy, MD as Consulting Physician (Rheumatology)  This Provider for this visit: Treatment Team:  Attending Provider: Kalman Shan, MD    01/03/2023 -   Chief Complaint  Patient presents with   Follow-up    F/up wheezing, congestion, cough, sob with exertion.     HPI Carmela Mcelhenney 72 y.o. -returns for follow-up.  At this visit she was supposed to have started pirfenidone but what happened is that she had abnormal liver enzymes at the time of last visit.  We found that she was taking a lot of Tylenol for the knee pain.  She is now saying she did not take a lot but she actually did.  Her liver enzymes have since normalized as of December 07, 2022.  She is concerned about a high total protein.  She wanted to discuss this.  I did recommend to her that she discuss this with  primary care physician.  Then she brought her back pain.  I also suggested she discuss with primary care physician.  She wants repeat liver enzymes check especially because of total protein which she believes is because of the Repatha.  Meanwhile she continues to be symptomatic.  We discussed in the case conference in the results are below.  I did share these results with the patient.     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 NOv 2023: most rcent versus Feb 2020 HRCT. In msost recent Ct  basilar predmon, ILD, Subpleural reticuation with prominent TB. There is clear honeycombing (moderate) in lung base. No air trapping. Progressed sinc2020.  C/w UIP. In 2020: read as alternate diagnosis but Dr Ashley Murrain thinks in retrospect is mild to moderate  probable or c/w UIP with mild honeycombing. The bronchiectasis all traction bronchiectasis of  fibrosis does not need bipsy. Call it IPF. Rx as IPF   We then discussed the antifibrotic's again.  We went over nintedanib or pirfenidone again.  We stuck with the original decision on taking pirfenidone and she is committing to it.  I will send a note to pharmacy.  Will check liver function test today.  SYMPTOM SCALE - ILD 08/23/2022 11/22/2022   Current weight    O2 use x ra  Shortness of Breath 0 -> 5 scale with 5 being worst (score 6 If unable to do)   At rest 0 2  Simple tasks - showers, clothes change, eating, shaving 2 3  Household (dishes, doing bed, laundry) 1 4  Shopping 1 3  Walking level at own pace 2 3  Walking up Stairs 3 5  Total (30-36) Dyspnea Score 9 20      Non-dyspnea symptoms (0-> 5 scale) 08/23/2022 01/03/2023   How bad is your cough? 0 3  How bad is your fatigue 0 2  How bad is nausea 00 0  How bad is vomiting?  0 0  How bad is diarrhea? 0 0  How bad is anxiety? 2 4  How bad is depression 2 5  Any chronic pain - if so where and how bad 1 x do  spirometry and   Simple office walk 185 feet x  3 laps goal with forehead probe 11/22/2022 Could not do due to Rt Knee pain   O2 used x   Number laps completed x   Comments about pace x   Resting Pulse Ox/HR x% and x/min   Final Pulse Ox/HR x% and x/min   Desaturated </= 88% x   Desaturated <= 3% points x   Got Tachycardic >/= 90/min x   Symptoms at end of test x   Miscellaneous comments xx       PFT     Latest Ref Rng & Units 08/24/2022    1:55 PM 06/08/2022    3:49 PM 10/16/2018   12:55 PM 01/17/2017    2:50 PM  PFT Results  FVC-Pre L 2.30  2.33  2.52  2.63   FVC-Predicted Pre % 72  73  96  98   FVC-Post L  2.36  2.49  2.52   FVC-Predicted Post %  74  94  94   Pre FEV1/FVC % % 92  93  90  90   Post FEV1/FCV % %  93  91  92   FEV1-Pre L 2.12  2.17  2.27  2.38   FEV1-Predicted Pre % 88  90  111  113   FEV1-Post L  2.20  2.26  2.32   DLCO uncorrected ml/min/mmHg 11.05  12.95  12.43  15.90   DLCO UNC% % 52  62  59  58   DLCO corrected ml/min/mmHg 11.05  12.95   15.80   DLCO COR %Predicted % 52  62   57   DLVA Predicted % 87  93  85  80   TLC L  3.76  4.00  3.85   TLC % Predicted %  69  74  71   RV % Predicted %  55  58  55        has a past medical history of AICD (automatic cardioverter/defibrillator) present, Anxiety, Arthritis, Back pain, Breast cancer (HCC) (2016), Bronchiectasis (HCC), Cerebral aneurysm, nonruptured, CHF (congestive heart failure) (HCC), Clostridium difficile infection, Depressive disorder, not elsewhere classified, Diverticulosis of colon (without mention  of hemorrhage), Esophageal reflux, Fatty liver, Fibromyalgia, Gastritis, GERD (gastroesophageal reflux disease), GI bleed (2004), Glaucoma, Hiatal hernia, History of COVID-19 (05/04/2021), Hyperlipidemia, Hypertension, Hypothyroidism, Internal hemorrhoids, Joint pain, Multifocal pneumonia (06/03/2021), Obstructive sleep apnea (adult) (pediatric), Osteoarthritis, Ostium secundum type atrial septal  defect, Other chronic nonalcoholic liver disease, Other pulmonary embolism and infarction, Palpitations, Paroxysmal ventricular tachycardia (HCC), Personal history of radiation therapy, PONV (postoperative nausea and vomiting), Presence of permanent cardiac pacemaker, PUD (peptic ulcer disease), Radiation (02/03/15-03/10/15), Sarcoid, Schatzki's ring, Shortness of breath, Sleep apnea, Stroke (HCC) (2013), Takotsubo syndrome, Tubular adenoma of colon, Unspecified transient cerebral ischemia, and Unspecified vitamin D deficiency.   reports that she quit smoking about 33 years ago. Her smoking use included cigarettes. She has a 15.00 pack-year smoking history. She has been exposed to tobacco smoke. She has never used smokeless tobacco.  Past Surgical History:  Procedure Laterality Date   ABI  2006   normal   BREAST EXCISIONAL BIOPSY     BREAST LUMPECTOMY Right 2016   BREAST LUMPECTOMY WITH RADIOACTIVE SEED LOCALIZATION Right 01/03/2015   Procedure: BREAST LUMPECTOMY WITH RADIOACTIVE SEED LOCALIZATION;  Surgeon: Chevis Pretty III, MD;  Location: [facility] OR;  Service: General;  Laterality: Right;   CARDIAC DEFIBRILLATOR PLACEMENT  2006; 2012   BSX single chamber ICD   carotid dopplers  2006   neg   CEREBRAL ANEURYSM REPAIR  02/1999   COLONOSCOPY     HAMMER TOE SURGERY  11/15/2020   3rd digit bilateral feet    hospitalization  2004   GI bleed, PUD, diverticulosis (EGD,colonscopy)   hospitalization     PE, NSVT, s/p defib   KNEE ARTHROSCOPY     bilateral   LEFT HEART CATHETERIZATION WITH CORONARY ANGIOGRAM N/A 02/22/2012   Procedure: LEFT HEART CATHETERIZATION WITH CORONARY ANGIOGRAM;  Surgeon: Kathleene Hazel, MD;  Location[facility] CATH LAB;  Service: Cardiovascular;  Laterality: N/A;   PARTIAL HYSTERECTOMY     Fibroids   TONGUE BIOPSY  12/12/2017   due to sore tongue and white patches/abnormal cells   TOTAL KNEE ARTHROPLASTY Left 02/13/2016   Procedure: TOTAL KNEE ARTHROPLASTY;  Surgeon: Dannielle Huh, MD;  Location: [facility] OR;  Service: Orthopedics;  Laterality: Left;   TOTAL KNEE ARTHROPLASTY Right 05/22/2021   Procedure: TOTAL KNEE ARTHROPLASTY;  Surgeon: Dannielle Huh, MD;  Location: [facility] ORS;  Service: Orthopedics;  Laterality: Right;  with block   UPPER GASTROINTESTINAL ENDOSCOPY      Allergies  Allergen Reactions   Ace Inhibitors Swelling    Angioedema; makes tongue "break out"    Amitriptyline Hcl Other (See Comments)     makes her too sleepy!   Atorvastatin Other (See Comments)    SEVERE MYALGIA   Dilantin [Phenytoin Sodium Extended] Rash    Severe rash   Duloxetine Hcl Nausea Only and Other (See Comments)    Sleepiness/ sick  Other Reaction(s): GI Intolerance   Paroxetine Nausea Only    Rapid heartbeat   Ramipril Other (See Comments)    TONGUE ULCERS    Rosuvastatin Other (See Comments)    SEVERE MYALGIA   Carbamazepine Rash   Codeine Itching   Paxlovid [Nirmatrelvir-Ritonavir] Itching   Phenytoin Sodium Extended Rash   Simvastatin Other (See Comments)    Increase in CK, myalgias   Wellbutrin [Bupropion] Palpitations    Immunization History  Administered Date(s) Administered   Fluad Quad(high Dose 65+) 04/17/2019, 04/30/2020   H1N1 07/24/2008   Influenza Split 07/17/2011, 09/27/2011, 04/29/2012   Influenza Whole 07/20/2004, 07/04/2007, 05/07/2008,  05/19/2009, 05/17/2010   Influenza, High Dose Seasonal PF 05/30/2017, 05/21/2018   Influenza,inj,Quad PF,6+ Mos 05/20/2013, 05/06/2014, 05/17/2015, 05/15/2016, 04/27/2021   Influenza-Unspecified 05/07/2018   PFIZER(Purple Top)SARS-COV-2 Vaccination 09/25/2019, 10/16/2019, 06/04/2020, 01/04/2021, 08/06/2021   Pfizer Covid-19 Vaccine Bivalent Booster 56yrs & up 05/07/2022   Pneumococcal Conjugate-13 11/22/2015, 08/06/2021   Pneumococcal Polysaccharide-23 05/20/2002, 05/07/2008, 06/25/2014   Respiratory Syncytial Virus Vaccine,Recomb Aduvanted(Arexvy) 06/26/2022   Td 09/07/2009   Zoster, Live 09/21/2011     Family History  Problem Relation Age of Onset   Diabetes Mother    Alzheimer's disease Mother    Hypertension Mother    Obesity Mother    Heart disease Father    Seizures Sister    Allergies Sister    Heart disease Sister    Other Brother        Thyroid problem 10/2016   Heart disease Brother    Prostate cancer Brother 46       same brother as throat cancer   Throat cancer Brother        dx in his 16s; also a smoker   Cancer Maternal Grandmother 24       colon cancer or abdominal cancer   Lung cancer Maternal Grandfather 78   Heart disease Son        Cardiac Arrest 07/2016   Breast cancer Maternal Aunt 55   Colon cancer Maternal Aunt 61       same sister as breast at 1   Breast cancer Maternal Aunt        dx in her 34s   Lung cancer Maternal Uncle    Pancreatic cancer Cousin 23       maternal first cousin   Breast cancer Cousin        paternal first cousin twice removed died in her 30s   Stroke Neg Hx      Current Outpatient Medications:    acetaminophen (TYLENOL) 500 MG tablet, Take 500 mg by mouth every 6 (six) hours as needed for moderate pain., Disp: , Rfl:    acetaminophen-codeine (TYLENOL/CODEINE #3) 300-30 MG tablet, Take 1 tablet by mouth every 8 (eight) hours as needed for moderate pain., Disp: 90 tablet, Rfl: 3   albuterol (PROVENTIL) (2.5 MG/3ML) 0.083% nebulizer solution, USE 1 VIAL IN NEBULIZER EVERY 6 HOURS (Patient taking differently: Take 2.5 mg by nebulization every 6 (six) hours as needed for shortness of breath.), Disp: 120 mL, Rfl: 11   albuterol (VENTOLIN HFA) 108 (90 Base) MCG/ACT inhaler, Inhale 2 puffs into the lungs every 6 (six) hours as needed for wheezing or shortness of breath., Disp: 18 g, Rfl: 3   Ascorbic Acid (VITAMIN C) 1000 MG tablet, Take 1,000 mg by mouth daily., Disp: , Rfl:    aspirin EC 81 MG tablet, Take 81 mg by mouth daily. Swallow whole., Disp: , Rfl:    azelastine (ASTELIN) 0.1 % nasal spray, Place 1 spray into both  nostrils 2 (two) times daily. Use in each nostril as directed, Disp: 90 mL, Rfl: 3   budesonide-formoterol (SYMBICORT) 80-4.5 MCG/ACT inhaler, Inhale 1 puff into the lungs 2 (two) times daily., Disp: 1 each, Rfl: 12   cetirizine (ZYRTEC) 10 MG tablet, Take 10 mg by mouth daily as needed for allergies., Disp: , Rfl:    Cholecalciferol (DIALYVITE VITAMIN D 5000) 125 MCG (5000 UT) capsule, Take 5,000 Units by mouth daily., Disp: , Rfl:    cloBAZam (ONFI) 10 MG tablet, Take 1 tablet (10 mg total) by mouth 2 (  two) times daily., Disp: 60 tablet, Rfl: 5   diltiazem (CARDIZEM CD) 120 MG 24 hr capsule, Take 1 capsule (120 mg total) by mouth daily., Disp: 90 capsule, Rfl: 3   empagliflozin (JARDIANCE) 10 MG TABS tablet, Take 1 tablet (10 mg total) by mouth daily., Disp: 90 tablet, Rfl: 3   Evolocumab (REPATHA SURECLICK) 140 MG/ML SOAJ, Inject 140 mg into the skin every 14 (fourteen) days., Disp: 6 mL, Rfl: 3   famotidine (PEPCID) 40 MG tablet, Take 1 tablet (40 mg total) by mouth at bedtime., Disp: 360 tablet, Rfl: 6   fexofenadine (ALLEGRA) 180 MG tablet, Take 180 mg by mouth daily as needed for allergies (alternates with zyrtec sometimes)., Disp: , Rfl:    fluticasone (CUTIVATE) 0.05 % cream, Apply topically 2 (two) times daily., Disp: 60 g, Rfl: 2   fluticasone (FLONASE) 50 MCG/ACT nasal spray, Place 2 sprays into both nostrils daily as needed for allergies or rhinitis., Disp: 48 g, Rfl: 3   FOLIC ACID PO, Take 1 tablet by mouth daily., Disp: , Rfl:    furosemide (LASIX) 20 MG tablet, TAKE 2 TABLETS (40 MG) 3 DAYS PER WEEK AND TAKE 1 TABLET (20 MG) THE OTHER DAYS OF THE WEEK, Disp: 130 tablet, Rfl: 1   gabapentin (NEURONTIN) 100 MG capsule, Take 1 capsule (100 mg total) by mouth 3 (three) times daily., Disp: 90 capsule, Rfl: 6   gabapentin (NEURONTIN) 300 MG capsule, Take 1 capsule (300 mg total) by mouth at bedtime., Disp: 30 capsule, Rfl: 2   hydroxypropyl methylcellulose / hypromellose (ISOPTO TEARS /  GONIOVISC) 2.5 % ophthalmic solution, Place 1 drop into both eyes at bedtime., Disp: , Rfl:    latanoprost (XALATAN) 0.005 % ophthalmic solution, Place 1 drop into both eyes at bedtime., Disp: , Rfl:    levothyroxine (SYNTHROID) 50 MCG tablet, Take 1 tablet (50 mcg total) by mouth daily., Disp: 90 tablet, Rfl: 0   magnesium oxide (MAG-OX) 400 MG tablet, Take 1 tablet (400 mg total) by mouth 2 (two) times daily., Disp: 180 tablet, Rfl: 3   mometasone (ELOCON) 0.1 % lotion, Apply 1 application topically daily as needed (rash)., Disp: 60 mL, Rfl: 1   montelukast (SINGULAIR) 10 MG tablet, TAKE 1 TABLET AT BEDTIME, Disp: 90 tablet, Rfl: 3   Multiple Vitamins-Minerals (MULTIVITAMIN PO), Take 1 tablet by mouth daily., Disp: , Rfl:    nitroGLYCERIN (NITROSTAT) 0.4 MG SL tablet, DISSOLVE ONE TABLET UNDER THE TONGUE EVERY 5 MINUTES AS NEEDED FOR CHEST PAIN.  DO NOT EXCEED A TOTAL OF 3 DOSES IN 15 MINUTES (Patient taking differently: Place 0.4 mg under the tongue every 5 (five) minutes as needed for chest pain.), Disp: 25 tablet, Rfl: 0   nystatin (MYCOSTATIN) 100000 UNIT/ML suspension, Take 5 mLs (500,000 Units total) by mouth 4 (four) times daily., Disp: 60 mL, Rfl: 0   pantoprazole (PROTONIX) 40 MG tablet, TAKE 1 TABLET EVERY DAY, Disp: 90 tablet, Rfl: 3   polyethylene glycol powder (GLYCOLAX/MIRALAX) powder, Take 17 grams by mouth daily, Disp: 1080 g, Rfl: 0   potassium chloride (KLOR-CON M) 10 MEQ tablet, TAKE 1/2 TABLET EVERY DAY (CALL OFFICE TO SCHEDULE YEARLY APPT), Disp: 45 tablet, Rfl: 3   Respiratory Therapy Supplies (FLUTTER) DEVI, 1 Device by Does not apply route as needed., Disp: 1 each, Rfl: 0   sodium chloride HYPERTONIC 3 % nebulizer solution, Take by nebulization 2 (two) times daily as needed for cough. 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, Disp: 750 mL, Rfl: 12  tiZANidine (ZANAFLEX) 4 MG tablet, Take 0.5-1 tablets (2-4 mg total) by mouth every 8 (eight) hours as needed for muscle spasms., Disp: 90 tablet,  Rfl: 5   traZODone (DESYREL) 100 MG tablet, TAKE 1 TO 1 AND 1/2 TABLETS AT BEDTIME, Disp: 135 tablet, Rfl: 3   triamcinolone cream (KENALOG) 0.1 %, Apply 1 Application topically 2 (two) times daily., Disp: 453.6 g, Rfl: 0   cefUROXime (CEFTIN) 500 MG tablet, Take 1 tablet (500 mg total) by mouth 2 (two) times daily with a meal. (Patient not taking: Reported on 01/03/2023), Disp: 14 tablet, Rfl: 0  Current Facility-Administered Medications:    lidocaine (XYLOCAINE) 1 % (with pres) injection 1 mL, 1 mL, Other, Once, Lovorn, Megan, MD   lidocaine (XYLOCAINE) 1 % (with pres) injection 3 mL, 3 mL, Other, Once, Lovorn, Megan, MD   triamcinolone acetonide (KENALOG-40) injection 40 mg, 40 mg, Intramuscular, Once, Lovorn, Megan, MD      Objective:   Vitals:   01/03/23 1344  BP: 130/70  Pulse: 85  SpO2: 95%  Weight: 225 lb 3.2 oz (102.2 kg)  Height: 5' 6.5" (1.689 m)    Estimated body mass index is 35.8 kg/m as calculated from the following:   Height as of this encounter: 5' 6.5" (1.689 m).   Weight as of this encounter: 225 lb 3.2 oz (102.2 kg).  @WEIGHTCHANGE @  [employer]   01/03/23 1344  Weight: 225 lb 3.2 oz (102.2 kg)     Physical Exam    General: No distress. oberweight Neuro: Alert and Oriented x 3. GCS 15. Speech normal Psych: Pleasant Resp:  Barrel Chest - no.  Wheeze - no, Crackles - YES, No overt respiratory distress CVS: Normal heart sounds. Murmurs - no Ext: Stigmata of Connective Tissue Disease - no HEENT: Normal upper airway. PEERL +. No post nasal drip        Assessment:       ICD-10-CM   1. IPF (idiopathic pulmonary fibrosis) (HCC)  J84.112     2. Dyspnea on exertion  R06.09     3. Abnormal LFTs  R79.89          Plan:     Patient Instructions     ICD-10-CM   1. IPF (idiopathic pulmonary fibrosis) (HCC)  J84.112     2. Dyspnea on exertion  R06.09     3. Abnormal LFTs  R79.89         PFT shows 9% decline over 4 years. CT scan has  changed 2013/2020 -> Nov 2023 ad current CT Nov 2023 more consistent with interstitial lung disease/pulmonary fibrosis.  Based on age greater than 60, bilateral lower lobe crackles, slow progression, normal blood work findings on CT scan I think the specific variety of pulmonary fibrosis IPF otherwise call idiopathic pulmonary fibrosis.  The diagnose of IPF has also been agreed upon by the case conference.  Recently liver enzymes were abnormal because of Tylenol intake but a month ago they normalized   PLAN  -Check liver function test today 01/03/2023 I had of starting pirfenidone   -Start pirfenidone per protocol [discussed again]   -Discussed benefits, risks and other option of nintedanib in detail  -Address back pain issues with primary care doctor  -Do spirometry and DLCO do spirometry and DLCO in 6 weeks  -Discuss clinical research as a care option in 6 weeks  Followup  -6 weeks video visit or face-to-face visit with nurse practitioner to review uptake with pirfenidone    SIGNATURE  Dr. Kalman Shan, M.D., F.C.C.P,  Pulmonary and Critical Care Medicine Staff Physician, [facility] Center Director - Interstitial Lung Disease  Program  Pulmonary Fibrosis [facility] at [facility], [place], [address]  Pager: [phone], If no answer or between  15:00h - 7:00h: call [phone] Telephone: [phone]  2:13 PM 01/03/2023

## 2023-01-03 NOTE — Telephone Encounter (Signed)
Noted - will f/u in coming days for updated LFT results  Chesley Mires, PharmD, MPH, BCPS, CPP Clinical Pharmacist (Rheumatology and Pulmonology)

## 2023-01-03 NOTE — Addendum Note (Signed)
Addended by: Hedda Slade on: 01/03/2023 02:24 PM   Modules accepted: Orders

## 2023-01-03 NOTE — Patient Instructions (Addendum)
ICD-10-CM   1. IPF (idiopathic pulmonary fibrosis) (HCC)  J84.112     2. Dyspnea on exertion  R06.09     3. Abnormal LFTs  R79.89         PFT shows 9% decline over 4 years. CT scan has changed 2013/2020 -> Nov 2023 ad current CT Nov 2023 more consistent with interstitial lung disease/pulmonary fibrosis.  Based on age greater than 60, bilateral lower lobe crackles, slow progression, normal blood work findings on CT scan I think the specific variety of pulmonary fibrosis IPF otherwise call idiopathic pulmonary fibrosis.  The diagnose of IPF has also been agreed upon by the case conference.  Recently liver enzymes were abnormal because of Tylenol intake but a month ago they normalized   PLAN  -Check liver function test today 01/03/2023 I had of starting pirfenidone   -Start pirfenidone per protocol [discussed again]   -Discussed benefits, risks and other option of nintedanib in detail  -Address back pain issues with primary care doctor  -Do spirometry and DLCO do spirometry and DLCO in 6 weeks  -Discuss clinical research as a care option in 6 weeks  Followup  -6 weeks video visit or face-to-face visit with nurse practitioner to review uptake with pirfenidone

## 2023-01-04 NOTE — Progress Notes (Signed)
Remote ICD transmission.   

## 2023-01-04 NOTE — Progress Notes (Signed)
Interstitial Lung Disease Multidisciplinary Conference   [name]    MRN [id number]    DOB 19-Jun-1951  Primary Care Physician:Wilson, Lauris Poag, MD  Referring Physician: Dr. Kalman Shan  Time of Conference: 7.00am- 8.00am Date of conference: 01/01/2023 Location of Conference: -  Virtual  Participating Pulmonary: Dr. Kalman Shan, Dr Chilton Greathouse, Dr. Melody Comas Pathology:  Radiology: Dr Cleone Slim MD  Others:   Brief History: Sees Dr Craige Cotta for what she though was bronchiectasis. Former smoker. Has GERD. Seriology negative. In sept 2023 had covid . OPD Rx with paxlovid. In oct 2022 knee surgery and then 3 weeks later - ILD flare up. Nov 2023 CT says UIP with progression. PFT over years shows FVC down but DLCO slightly down, 9% x 4 years. Has CT going back to 2013 and 2020 when her diagnosis was bronchiectasis.  - Is this UIP? - Is there progression? - Is this post covid? - Biopsy? V antifibrotic?   PFT    Latest Ref Rng & Units 08/24/2022    1:55 PM 06/08/2022    3:49 PM 10/16/2018   12:55 PM 01/17/2017    2:50 PM  PFT Results  FVC-Pre L 2.30  2.33  2.52  2.63   FVC-Predicted Pre % 72  73  96  98   FVC-Post L  2.36  2.49  2.52   FVC-Predicted Post %  74  94  94   Pre FEV1/FVC % % 92  93  90  90   Post FEV1/FCV % %  93  91  92   FEV1-Pre L 2.12  2.17  2.27  2.38   FEV1-Predicted Pre % 88  90  111  113   FEV1-Post L  2.20  2.26  2.32   DLCO uncorrected ml/min/mmHg 11.05  12.95  12.43  15.90   DLCO UNC% % 52  62  59  58   DLCO corrected ml/min/mmHg 11.05  12.95   15.80   DLCO COR %Predicted % 52  62   57   DLVA Predicted % 87  93  85  80   TLC L  3.76  4.00  3.85   TLC % Predicted %  69  74  71   RV % Predicted %  55  58  55       MDD discussion of CT scan    - Date or time period of scan:  HRCT: 07/06/2022 HRCT: 10/08/2018 CT Chest: 04/10/2012  - Discussion synopsis:  Definitely progressive 2013 very mild non specific ILD. > 2020 prob  to definite uip -> 2023 defiite UIP. NOv 2023: most rcent versus Feb 2020 HRCT. In msost recent Ct  basilar predmon, ILD, Subpleural reticuation with prominent TB. There is clear honeycombing (moderate) in lung base. No air trapping. Progressed sinc2020.    - What is the final conclusion per 2018 ATS/Fleischner Criteria -  C/w UIP. In 2020: read as alternate diagnosis but Dr Ashley Murrain thinks in retrospect is mild to moderate  probable or c/w UIP with mild honeycombing. The bronchiectasis all traction bronchiectasis of fibrosis   - Concordance with official report:  Concordant with official report for 2023 but discordant for the 2020 read  Pathology discussion of biopsy : n/a    MDD Impression/Recs: does not need bipsy. Call it IPF. Rx as IPF   Time Spent in preparation and discussion:  > 30 min    SIGNATURE   Dr. Kalman Shan, M.D., F.C.C.P,  Pulmonary and Critical  Care Medicine Staff Physician, [facility] Center Director - Interstitial Lung Disease  Program  Pulmonary Fibrosis [facility] [place], [place], [address]  Pager: [phone], If no answer or between  15:00h - 7:00h: call [phone] Telephone: [phone]  11:56 AM 01/04/2023 ...................................................................................................................[name] References: Diagnosis of Hypersensitivity Pneumonitis in Adults. An Official ATS/JRS/ALAT Clinical Practice Guideline. Ragu G et al, Am J Respir Crit Care Med. 2020 Aug 1;202(3):e36-e69.       Diagnosis of Idiopathic Pulmonary Fibrosis. An Official ATS/ERS/JRS/ALAT Clinical Practice Guideline. Raghu G et al, Am J Respir Crit Care Med. 2018 Sep 1;198(5):e44-e68.   IPF Suspected   Histopath ology Pattern      UIP  Probable UIP  Indeterminate for  UIP  Alternative  diagnosis    UIP  IPF  IPF  IPF  Non-IPF dx   HRCT   Probabe UIP  IPF  IPF  IPF (Likely)**   Non-IPF dx  Pattern  Indeterminate for UIP  IPF  IPF (Likely)**  Indeterminate  for IPF**  Non-IPF dx    Alternative diagnosis  IPF (Likely)**/ non-IPF dx  Non-IPF dx  Non-IPF dx  Non-IPF dx     Idiopathic pulmonary fibrosis diagnosis based upon HRCT and Biopsy paterns.  ** IPF is the likely diagnosis when any of following features are present:  Moderate-to-severe traction bronchiectasis/bronchiolectasis (defined as mild traction bronchiectasis/bronchiolectasis in four or more lobes including the lingual as a lobe, or moderate to severe traction bronchiectasis in two or more lobes) in a man over age 26 years or in a woman over age 18 years Extensive (>30%) reticulation on HRCT and an age >70 years  Increased neutrophils and/or absence of lymphocytosis in BAL fluid  Multidisciplinary discussion reaches a confident diagnosis of IPF.   **Indeterminate for IPF  Without an adequate biopsy is unlikely to be IPF  With an adequate biopsy may be reclassified to a more specific diagnosis after multidisciplinary discussion and/or additional consultation.   dx = diagnosis; HRCT = high-resolution computed tomography; IPF = idiopathic pulmonary fibrosis; UIP = usual interstitial pneumonia.

## 2023-01-07 ENCOUNTER — Telehealth: Payer: Self-pay | Admitting: Internal Medicine

## 2023-01-07 NOTE — Telephone Encounter (Signed)
   Liver function test is up again.    Plan - Please find out if she is taking Tylenol again.  I thought she said in the office she was not taking it.  But double check please.  And please make sure her medication list is accurate.  At this point in time I am reluctant to start the pirfenidone till the LFT issues are sorted out.  No results for input(s): "LATICACIDVEN", "PROCALCITON" in the last 168 hours. Recent Labs  Lab 01/03/23 1455  AST 41*  ALT 38*  ALKPHOS 125*  BILITOT 0.3  PROT 7.3  ALBUMIN 4.0

## 2023-01-07 NOTE — Telephone Encounter (Signed)
Attempted to call pt but unable to reach. Left message for her to return call. 

## 2023-01-08 ENCOUNTER — Encounter: Payer: Self-pay | Admitting: Internal Medicine

## 2023-01-08 ENCOUNTER — Telehealth: Payer: Self-pay

## 2023-01-08 NOTE — Telephone Encounter (Signed)
LFTs elevated again (<3xULN). Patient will need repeat LFTs in one week. She has been taking liver detox supplement which she will d/c  Chesley Mires, PharmD, MPH, BCPS, CPP Clinical Pharmacist (Rheumatology and Pulmonology)

## 2023-01-08 NOTE — Telephone Encounter (Signed)
Pt. Calling back to go over results she is asking if we could give call back after 3:30pm

## 2023-01-08 NOTE — Telephone Encounter (Signed)
Called and spoke with pt about elevated lft. I went over pt medication list to update what she is taking and not taking. Pt stated she is not taking tylenol but is taking a liver detox supplement. Pt has question about the plan moving further on how she can get her lft down. Patient has also been informed to not continue pirfenidone until we can sort this matter out.

## 2023-01-08 NOTE — Telephone Encounter (Signed)
Plea efind out name of liver detox supplment and she needs to stop it NOW -> recheck LFT in 1 week

## 2023-01-08 NOTE — Telephone Encounter (Signed)
Called and spoke with ms.Hathaway she is not at home right now. As soon as she informs me the name I will send to inform you. I did advise pt to stop the detox and recheck lft in 1 week

## 2023-01-08 NOTE — Telephone Encounter (Signed)
   Pre-operative Risk Assessment    Patient Name: [name]  DOB: 11/28/1950 MRN: [id number]      Request for Surgical Clearance    Procedure:   KAHOOK GONIOTOMY   Date of Surgery:  Clearance TBD                                 Surgeon:  DR. Dione Booze Surgeon's Group or Practice Name:  [facility] Phone number:  [phone] Fax number:  [phone]   Type of Clearance Requested:   - Pharmacy:  Hold Aspirin NEEDS INSTRUCTIONS WHEN TO HOLD   Type of Anesthesia:  MAC   Additional requests/questions:    Signed, Michaelle Copas   01/08/2023, 5:31 PM

## 2023-01-09 ENCOUNTER — Telehealth: Payer: Self-pay | Admitting: *Deleted

## 2023-01-09 DIAGNOSIS — M609 Myositis, unspecified: Secondary | ICD-10-CM

## 2023-01-09 NOTE — Telephone Encounter (Signed)
Pt has been scheduled for tele pre op appt 01/25/23 @ 9:40. Med rec and consent are done.

## 2023-01-09 NOTE — Telephone Encounter (Signed)
Yes stop it and retest LFT on Tuesday. Let her know meds can cause liver injury

## 2023-01-09 NOTE — Telephone Encounter (Signed)
I have received the following message from the patient   I have been taking Vitamized liver cleansing and detox I purchase it from [place]   This is the liver detox the pt has been taking. I have advised the patient to stop taking this supplement.

## 2023-01-09 NOTE — Telephone Encounter (Signed)
   Name: [name]  DOB: 12-29-1950  MRN: [id number]  Primary Cardiologist: Lance Muss, MD   Preoperative team, please contact this patient and set up a phone call appointment for further preoperative risk assessment. Please obtain consent and complete medication review. Thank you for your help.  I confirm that guidance regarding antiplatelet and oral anticoagulation therapy has been completed and, if necessary, noted below.  Per office protocol, if patient is without any new symptoms or concerns at the time of their virtual visit, she may hold Aspirin for 5-7 days prior to procedure. Please resume Aspirin as soon as possible postprocedure, at the discretion of the surgeon.     Joylene Grapes, NP 01/09/2023, 7:51 AM [facility]

## 2023-01-09 NOTE — Telephone Encounter (Signed)
Pt has been scheduled for tele pre op appt 01/25/23 @ 9:40. Med rec and consent are done.     Patient Consent for Virtual Visit        [name] has provided verbal consent on 01/09/2023 for a virtual visit (video or telephone).   CONSENT FOR VIRTUAL VISIT FOR:  [name]  By participating in this virtual visit I agree to the following:  I hereby voluntarily request, consent and authorize [facility] and its employed or contracted physicians, physician assistants, nurse practitioners or other licensed health care professionals (the Practitioner), to provide me with telemedicine health care services (the "Services") as deemed necessary by the treating Practitioner. I acknowledge and consent to receive the Services by the Practitioner via telemedicine. I understand that the telemedicine visit will involve communicating with the Practitioner through live audiovisual communication technology and the disclosure of certain medical information by electronic transmission. I acknowledge that I have been given the opportunity to request an in-person assessment or other available alternative prior to the telemedicine visit and am voluntarily participating in the telemedicine visit.  I understand that I have the right to withhold or withdraw my consent to the use of telemedicine in the course of my care at any time, without affecting my right to future care or treatment, and that the Practitioner or I may terminate the telemedicine visit at any time. I understand that I have the right to inspect all information obtained and/or recorded in the course of the telemedicine visit and may receive copies of available information for a reasonable fee.  I understand that some of the potential risks of receiving the Services via telemedicine include:  Delay or interruption in medical evaluation due to technological equipment failure or disruption; Information transmitted may not be sufficient  (e.g. poor resolution of images) to allow for appropriate medical decision making by the Practitioner; and/or  In rare instances, security protocols could fail, causing a breach of personal health information.  Furthermore, I acknowledge that it is my responsibility to provide information about my medical history, conditions and care that is complete and accurate to the best of my ability. I acknowledge that Practitioner's advice, recommendations, and/or decision may be based on factors not within their control, such as incomplete or inaccurate data provided by me or distortions of diagnostic images or specimens that may result from electronic transmissions. I understand that the practice of medicine is not an exact science and that Practitioner makes no warranties or guarantees regarding treatment outcomes. I acknowledge that a copy of this consent can be made available to me via my patient portal [facility] MyChart), or I can request a printed copy by calling the office of [facility].    I understand that my insurance will be billed for this visit.   I have read or had this consent read to me. I understand the contents of this consent, which adequately explains the benefits and risks of the Services being provided via telemedicine.  I have been provided ample opportunity to ask questions regarding this consent and the Services and have had my questions answered to my satisfaction. I give my informed consent for the services to be provided through the use of telemedicine in my medical care

## 2023-01-11 NOTE — Telephone Encounter (Signed)
LFT ordered and pt notified via My Chart or Dr. Jane Canary recommendations. Nothing further needed at this time.

## 2023-01-15 ENCOUNTER — Ambulatory Visit (INDEPENDENT_AMBULATORY_CARE_PROVIDER_SITE_OTHER): Payer: Medicare Other

## 2023-01-15 VITALS — Ht 66.5 in | Wt 224.0 lb

## 2023-01-15 DIAGNOSIS — Z0000 Encounter for general adult medical examination without abnormal findings: Secondary | ICD-10-CM | POA: Diagnosis not present

## 2023-01-15 NOTE — Progress Notes (Signed)
I connected with  Salicia Nurnberg on 01/15/23 by a audio enabled telemedicine application and verified that I am speaking with the correct person using two identifiers.  Patient Location: Home  Provider Location: Office/Clinic  I discussed the limitations of evaluation and management by telemedicine. The patient expressed understanding and agreed to proceed.  Subjective:   [name] is a 72 y.o. female who presents for Medicare Annual (Subsequent) preventive examination.  Patient Medicare AWV questionnaire was completed by the patient on 01/11/2023; I have confirmed that all information answered by patient is correct and no changes since this date.     Review of Systems    Cardiac Risk Factors include: advanced age (>1men, >60 women);dyslipidemia;hypertension;obesity (BMI >30kg/m2)     Objective:    Today's Vitals   01/15/23 1041 01/15/23 1042  Weight: 224 lb (101.6 kg)   Height: 5' 6.5" (1.689 m)   PainSc:  5    Body mass index is 35.61 kg/m.     01/15/2023   10:53 AM 08/03/2022    1:28 PM 01/09/2022    1:03 PM 06/03/2021    6:00 PM 06/03/2021    9:12 AM 05/22/2021    7:52 AM 04/26/2021   12:52 PM  Advanced Directives  Does Patient Have a Medical Advance Directive? Yes Yes Yes Yes Yes No No;Yes  Type of Estate agent of [place];Living will Healthcare Power of [place];Living will Healthcare Power of [place];Living will Living will Living will  Living will  Does patient want to make changes to medical advance directive?  No - Patient declined No - Patient declined No - Patient declined     Copy of Healthcare Power of Attorney in Chart? No - copy requested No - copy requested No - copy requested      Would patient like information on creating a medical advance directive?  No - Patient declined   No - Patient declined No - Patient declined     Current Medications (verified) Outpatient Encounter Medications as of 01/15/2023  Medication Sig    albuterol (PROVENTIL) (2.5 MG/3ML) 0.083% nebulizer solution USE 1 VIAL IN NEBULIZER EVERY 6 HOURS (Patient taking differently: Take 2.5 mg by nebulization every 6 (six) hours as needed for shortness of breath.)   albuterol (VENTOLIN HFA) 108 (90 Base) MCG/ACT inhaler Inhale 2 puffs into the lungs every 6 (six) hours as needed for wheezing or shortness of breath.   aspirin EC 81 MG tablet Take 81 mg by mouth daily. Swallow whole.   cetirizine (ZYRTEC) 10 MG tablet Take 10 mg by mouth daily as needed for allergies.   Cholecalciferol (DIALYVITE VITAMIN D 5000) 125 MCG (5000 UT) capsule Take 5,000 Units by mouth daily.   cloBAZam (ONFI) 10 MG tablet Take 1 tablet (10 mg total) by mouth 2 (two) times daily.   diltiazem (CARDIZEM CD) 120 MG 24 hr capsule Take 1 capsule (120 mg total) by mouth daily.   empagliflozin (JARDIANCE) 10 MG TABS tablet Take 1 tablet (10 mg total) by mouth daily.   Evolocumab (REPATHA SURECLICK) 140 MG/ML SOAJ Inject 140 mg into the skin every 14 (fourteen) days.   famotidine (PEPCID) 40 MG tablet Take 1 tablet (40 mg total) by mouth at bedtime.   fexofenadine (ALLEGRA) 180 MG tablet Take 180 mg by mouth daily as needed for allergies (alternates with zyrtec sometimes).   fluticasone (CUTIVATE) 0.05 % cream Apply topically 2 (two) times daily.   fluticasone (FLONASE) 50 MCG/ACT nasal spray Place 2 sprays  into both nostrils daily as needed for allergies or rhinitis.   FOLIC ACID PO Take 1 tablet by mouth daily.   furosemide (LASIX) 20 MG tablet TAKE 2 TABLETS (40 MG) 3 DAYS PER WEEK AND TAKE 1 TABLET (20 MG) THE OTHER DAYS OF THE WEEK (Patient taking differently: Take 20 mg by mouth daily as needed. PER PT TAKES PRN)   gabapentin (NEURONTIN) 100 MG capsule Take 1 capsule (100 mg total) by mouth 3 (three) times daily.   gabapentin (NEURONTIN) 300 MG capsule Take 1 capsule (300 mg total) by mouth at bedtime.   hydroxypropyl methylcellulose / hypromellose (ISOPTO TEARS / GONIOVISC)  2.5 % ophthalmic solution Place 1 drop into both eyes at bedtime.   latanoprost (XALATAN) 0.005 % ophthalmic solution Place 1 drop into both eyes at bedtime.   levothyroxine (SYNTHROID) 50 MCG tablet Take 1 tablet (50 mcg total) by mouth daily.   magnesium oxide (MAG-OX) 400 MG tablet Take 1 tablet (400 mg total) by mouth 2 (two) times daily.   montelukast (SINGULAIR) 10 MG tablet TAKE 1 TABLET AT BEDTIME   Multiple Vitamins-Minerals (MULTIVITAMIN PO) Take 1 tablet by mouth daily.   nitroGLYCERIN (NITROSTAT) 0.4 MG SL tablet DISSOLVE ONE TABLET UNDER THE TONGUE EVERY 5 MINUTES AS NEEDED FOR CHEST PAIN.  DO NOT EXCEED A TOTAL OF 3 DOSES IN 15 MINUTES (Patient taking differently: Place 0.4 mg under the tongue every 5 (five) minutes as needed for chest pain.)   pantoprazole (PROTONIX) 40 MG tablet TAKE 1 TABLET EVERY DAY   polyethylene glycol powder (GLYCOLAX/MIRALAX) powder Take 17 grams by mouth daily   potassium chloride (KLOR-CON M) 10 MEQ tablet TAKE 1/2 TABLET EVERY DAY (CALL OFFICE TO SCHEDULE YEARLY APPT)   Respiratory Therapy Supplies (FLUTTER) DEVI 1 Device by Does not apply route as needed.   sodium chloride HYPERTONIC 3 % nebulizer solution Take by nebulization 2 (two) times daily as needed for cough. 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   tiZANidine (ZANAFLEX) 4 MG tablet Take 0.5-1 tablets (2-4 mg total) by mouth every 8 (eight) hours as needed for muscle spasms.   traZODone (DESYREL) 100 MG tablet TAKE 1 TO 1 AND 1/2 TABLETS AT BEDTIME   triamcinolone cream (KENALOG) 0.1 % Apply 1 Application topically 2 (two) times daily.   acetaminophen (TYLENOL) 500 MG tablet Take 500 mg by mouth every 6 (six) hours as needed for moderate pain. (Patient not taking: Reported on 01/08/2023)   acetaminophen-codeine (TYLENOL/CODEINE #3) 300-30 MG tablet Take 1 tablet by mouth every 8 (eight) hours as needed for moderate pain. (Patient not taking: Reported on 01/08/2023)   Ascorbic Acid (VITAMIN C) 1000 MG tablet Take  1,000 mg by mouth daily. (Patient not taking: Reported on 01/08/2023)   azelastine (ASTELIN) 0.1 % nasal spray Place 1 spray into both nostrils 2 (two) times daily. Use in each nostril as directed (Patient not taking: Reported on 01/08/2023)   budesonide-formoterol (SYMBICORT) 80-4.5 MCG/ACT inhaler Inhale 1 puff into the lungs 2 (two) times daily. (Patient not taking: Reported on 01/08/2023)   cefUROXime (CEFTIN) 500 MG tablet Take 1 tablet (500 mg total) by mouth 2 (two) times daily with a meal. (Patient not taking: Reported on 01/03/2023)   mometasone (ELOCON) 0.1 % lotion Apply 1 application topically daily as needed (rash). (Patient not taking: Reported on 01/15/2023)   nystatin (MYCOSTATIN) 100000 UNIT/ML suspension Take 5 mLs (500,000 Units total) by mouth 4 (four) times daily. (Patient not taking: Reported on 01/15/2023)   Facility-Administered Encounter Medications as  of 01/15/2023  Medication   lidocaine (XYLOCAINE) 1 % (with pres) injection 1 mL   lidocaine (XYLOCAINE) 1 % (with pres) injection 3 mL   triamcinolone acetonide (KENALOG-40) injection 40 mg    Allergies (verified) Ace inhibitors, Amitriptyline hcl, Atorvastatin, Dilantin [phenytoin sodium extended], Duloxetine hcl, Paroxetine, Ramipril, Rosuvastatin, Carbamazepine, Codeine, Paxlovid [nirmatrelvir-ritonavir], Phenytoin sodium extended, Simvastatin, and Wellbutrin [bupropion]   History: 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   Bronchiectasis (HCC)    Cerebral aneurysm, nonruptured    had a clip put in   CHF (congestive heart failure) (HCC)    Clostridium difficile infection    Depressive disorder, not elsewhere classified    Diverticulosis of colon (without mention of hemorrhage)    Esophageal reflux    Fatty liver    Fibromyalgia     Gastritis    GERD (gastroesophageal reflux disease)    GI bleed 2004   Glaucoma    Hiatal hernia    History of COVID-19 05/04/2021   Hyperlipidemia    Hypertension    Hypothyroidism    Internal hemorrhoids    Joint pain    Multifocal pneumonia 06/03/2021   Obstructive sleep apnea (adult) (pediatric)    Osteoarthritis    Ostium secundum type atrial septal defect    Other chronic nonalcoholic liver disease    Other pulmonary embolism and infarction    Palpitations    Paroxysmal ventricular tachycardia (HCC)    Personal history of radiation therapy    PONV (postoperative nausea and vomiting)    Presence of permanent cardiac pacemaker    PUD (peptic ulcer disease)    Radiation 02/03/15-03/10/15   Right Breast   Sarcoid    per pt , not sure   Schatzki's ring    Shortness of breath    Sleep apnea    Stroke [facility]) 2013   tia/ pt feels it was around 2008 0r 2009   Takotsubo syndrome    Tubular adenoma of colon    Unspecified transient cerebral ischemia    Unspecified vitamin D deficiency    Past Surgical History:  Procedure Laterality Date   ABDOMINAL HYSTERECTOMY  11/22/1998   ABI  2006   normal   BREAST EXCISIONAL BIOPSY     BREAST LUMPECTOMY Right 2016   BREAST LUMPECTOMY WITH RADIOACTIVE SEED LOCALIZATION Right 01/03/2015   Procedure: BREAST LUMPECTOMY WITH RADIOACTIVE SEED LOCALIZATION;  Surgeon: Chevis Pretty III, MD;  Location: [facility] OR;  Service: General;  Laterality: Right;   CARDIAC DEFIBRILLATOR PLACEMENT  2006; 2012   BSX single chamber ICD   carotid dopplers  2006   neg   CEREBRAL ANEURYSM REPAIR  02/1999   COLONOSCOPY     HAMMER TOE SURGERY  11/15/2020   3rd digit bilateral feet    hospitalization  2004   GI bleed, PUD, diverticulosis (EGD,colonscopy)   hospitalization     PE, NSVT, s/p defib   JOINT REPLACEMENT  05/2021   KNEE ARTHROSCOPY     bilateral   LEFT HEART CATHETERIZATION WITH CORONARY ANGIOGRAM N/A 02/22/2012   Procedure: LEFT HEART CATHETERIZATION  WITH CORONARY ANGIOGRAM;  Surgeon: Kathleene Hazel, MD;  Location: [facility] CATH LAB;  Service: Cardiovascular;  Laterality: N/A;   PARTIAL HYSTERECTOMY     Fibroids   TONGUE BIOPSY  12/12/2017  due to sore tongue and white patches/abnormal cells   TOTAL KNEE ARTHROPLASTY Left 02/13/2016   Procedure: TOTAL KNEE ARTHROPLASTY;  Surgeon: Dannielle Huh, MD;  Location: [facility] OR;  Service: Orthopedics;  Laterality: Left;   TOTAL KNEE ARTHROPLASTY Right 05/22/2021   Procedure: TOTAL KNEE ARTHROPLASTY;  Surgeon: Dannielle Huh, MD;  Location: [facility] ORS;  Service: Orthopedics;  Laterality: Right;  with block   TUBAL LIGATION  09/20/1978   UPPER GASTROINTESTINAL ENDOSCOPY     Family History  Problem Relation Age of Onset   Diabetes Mother    Alzheimer's disease Mother    Hypertension Mother    Obesity Mother    Arthritis Mother    Heart disease Father    Seizures Sister    Allergies Sister    Heart disease Sister    Early death Sister    Other Brother        Thyroid problem 10/2016   Hearing loss Brother    Vision loss Brother    Heart disease Brother    Prostate cancer Brother 65       same brother as throat cancer   Throat cancer Brother        dx in his 39s; also a smoker   Arthritis Brother    Hypertension Brother    Cancer Maternal Grandmother 30       colon cancer or abdominal cancer   Lung cancer Maternal Grandfather 72   Heart disease Son        Cardiac Arrest 07/2016   Breast cancer Maternal Aunt 55   Colon cancer Maternal Aunt 79       same sister as breast at 43   Breast cancer Maternal Aunt        dx in her 64s   Lung cancer Maternal Uncle    Pancreatic cancer Cousin 71       maternal first cousin   Breast cancer Cousin        paternal first cousin twice removed died in her 46s   Anxiety disorder Sister    Arthritis Sister    Depression Sister    Hypertension Sister    Stroke Neg Hx    Social History   Socioeconomic History   Marital status: Divorced    Spouse name:  Not on file   Number of children: 2   Years of education: 14   Highest education level: Associate degree: occupational, Scientist, product/process development, or vocational program  Occupational History   Occupation: DISABLED  Tobacco Use   Smoking status: Former    Packs/day: 1.00    Years: 20.00    Additional pack years: 0.00    Total pack years: 20.00    Types: Cigarettes    Quit date: 08/20/1989    Years since quitting: 33.4    Passive exposure: Past   Smokeless tobacco: Never   Tobacco comments:    Mother, brother and sister  Vaping Use   Vaping Use: Never used  Substance and Sexual Activity   Alcohol use: Yes    Alcohol/week: 1.0 standard drink of alcohol    Types: 1 Glasses of wine per week    Comment: occasionally, not on a weekly basis   Drug use: Not Currently    Frequency: 1.0 times per week    Comment: CBD gummy occ; marijuana occasionally   Sexual activity: Not Currently    Birth control/protection: Abstinence  Other Topics Concern   Not on file  Social History Narrative   Lives alone  Caffeine ZOX:WRUE   Retired from [employer]   2 children -- 5 grandbabies - 1 great-grandbaby    Social Determinants of Health   Financial Resource Strain: Low Risk  (01/11/2023)   Overall Financial Resource Strain (CARDIA)    Difficulty of Paying Living Expenses: Not very hard  Recent Concern: Financial Resource Strain - Medium Risk (12/25/2022)   Overall Financial Resource Strain (CARDIA)    Difficulty of Paying Living Expenses: Somewhat hard  Food Insecurity: No Food Insecurity (01/11/2023)   Hunger Vital Sign    Worried About Running Out of Food in the Last Year: Never true    Ran Out of Food in the Last Year: Never true  Transportation Needs: No Transportation Needs (01/11/2023)   PRAPARE - Administrator, Civil Service (Medical): No    Lack of Transportation (Non-Medical): No  Physical Activity: Inactive (01/11/2023)   Exercise Vital Sign    Days of Exercise per Week: 0 days     Minutes of Exercise per Session: 0 min  Stress: Stress Concern Present (12/25/2022)   [employer] of Occupational Health - Occupational Stress Questionnaire    Feeling of Stress : To some extent  Social Connections: Unknown (01/11/2023)   Social Connection and Isolation Panel [NHANES]    Frequency of Communication with Friends and Family: More than three times a week    Frequency of Social Gatherings with Friends and Family: Once a week    Attends Religious Services: Patient declined    Database administrator or Organizations: No    Attends Banker Meetings: Never    Marital Status: Divorced    Tobacco Counseling Counseling given: Not Answered Tobacco comments: Mother, brother and sister   Clinical Intake:  Pre-visit preparation completed: Yes  Pain : 0-10 Pain Score: 5  Pain Type: Acute pain Pain Location: Generalized Pain Orientation: Lower Pain Descriptors / Indicators: Aching Pain Onset: More than a month ago Pain Frequency: Constant     Nutritional Status: BMI > 30  Obese Nutritional Risks: Nausea/ vomitting/ diarrhea (three weeks ago diarrhea and vomiting for 1 night) Diabetes: No  How often do you need to have someone help you when you read instructions, pamphlets, or other written materials from your doctor or pharmacy?: 1 - Never  Diabetic? no  Interpreter Needed?: No  Information entered by :: NAllen LPN   Activities of Daily Living    01/11/2023    8:49 AM  In your present state of health, do you have any difficulty performing the following activities:  Hearing? 0  Vision? 1  Difficulty concentrating or making decisions? 0  Walking or climbing stairs? 1  Comment due to back and lungs  Dressing or bathing? 0  Doing errands, shopping? 0  Preparing Food and eating ? N  Using the Toilet? N  In the past six months, have you accidently leaked urine? Y  Do you have problems with loss of bowel control? N  Managing your Medications? N   Managing your Finances? N  Housekeeping or managing your Housekeeping? Y    Patient Care Team: Georganna Skeans, MD as PCP - General (Family Medicine) Duke Salvia, MD as PCP - Electrophysiology (Cardiology) Corky Crafts, MD as PCP - Cardiology (Cardiology) Griselda Miner, MD as Consulting Physician (General Surgery) Malachy Mood, MD as Consulting Physician (Hematology) Lurline Hare, MD as Consulting Physician (Radiation Oncology) Donnelly Angelica, RN as Registered Nurse Pershing Proud, RN as Registered Nurse Sherryl Manges  Salena Saner, MD as Consulting Physician (Cardiology) Hubbard Hartshorn, NP (Inactive) as Nurse Practitioner (Nurse Practitioner) [name], NP as Nurse Practitioner (Nurse Practitioner) Coralyn Helling, MD as Consulting Physician (Pulmonary Disease) Griselda Miner, MD as Consulting Physician (General Surgery) Pollyann Savoy, MD as Consulting Physician (Rheumatology)  Indicate any recent Medical Services you may have received from other than [facility] providers in the past year (date may be approximate).     Assessment:   This is a routine wellness examination for [name].  Hearing/Vision screen Vision Screening - Comments:: Regular eye exams, [facility]  Dietary issues and exercise activities discussed: Current Exercise Habits: The patient does not participate in regular exercise at present   Goals Addressed             This Visit's Progress    Patient Stated       01/15/2023, wants to work with pulmonologist and get back feeling better       Depression Screen    01/15/2023   10:54 AM 11/09/2022    1:14 PM 07/24/2022   10:28 AM 06/27/2022    1:20 PM 05/25/2022   10:51 AM 03/23/2022    3:29 PM 02/26/2022    2:10 PM  PHQ 2/9 Scores  PHQ - 2 Score 0 3 0 0 0 0 0  PHQ- 9 Score   0        Fall Risk    01/11/2023    8:49 AM 11/09/2022    1:14 PM 09/12/2022   11:36 AM 06/27/2022    1:19 PM 05/25/2022   10:51 AM  Fall Risk   Falls in the  past year? 1 0 0 0 0  Number falls in past yr: 0 0  0   Injury with Fall? 1 0  0   Risk for fall due to : Impaired mobility;History of fall(s);Medication side effect   No Fall Risks   Follow up Falls prevention discussed;Education provided;Falls evaluation completed   Falls evaluation completed     FALL RISK PREVENTION PERTAINING TO THE HOME:  Any stairs in or around the home? Yes  If so, are there any without handrails? Yes  Home free of loose throw rugs in walkways, pet beds, electrical cords, etc? No  Adequate lighting in your home to reduce risk of falls? Yes   ASSISTIVE DEVICES UTILIZED TO PREVENT FALLS:  Life alert? No  Use of a cane, walker or w/c? Yes  Grab bars in the bathroom? No  Shower chair or bench in shower? Yes  Elevated toilet seat or a handicapped toilet? Yes   TIMED UP AND GO:  Was the test performed? No .      Cognitive Function:    12/07/2019   12:04 PM 12/04/2018    4:54 PM 11/27/2017   10:27 AM 11/26/2016   10:25 AM 11/22/2015    8:46 AM  MMSE - Mini Mental State Exam  Not completed:  Unable to complete     Orientation to time 5  5 5 5   Orientation to Place 5  5 5 5   Registration 3  3 3 3   Attention/ Calculation 5  0 0 0  Recall 3  3 3 3   Language- name 2 objects   0 0 0  Language- repeat 1  1 1 1   Language- follow 3 step command   3 3 3   Language- read & follow direction   0 0 0  Write a sentence   0 0 0  Copy design   0 0 0  Total score   20 20 20         01/15/2023   10:56 AM 12/19/2021   10:46 AM 12/08/2020   11:15 AM  6CIT Screen  What Year? 0 points 0 points 0 points  What month? 0 points 0 points 0 points  What time? 0 points 0 points   Count back from 20 0 points 0 points 0 points  Months in reverse 0 points 0 points 0 points  Repeat phrase 2 points 0 points 0 points  Total Score 2 points 0 points     Immunizations Immunization History  Administered Date(s) Administered   Fluad Quad(high Dose 65+) 04/17/2019, 04/30/2020   H1N1  07/24/2008   Influenza Split 07/17/2011, 09/27/2011, 04/29/2012   Influenza Whole 07/20/2004, 07/04/2007, 05/07/2008, 05/19/2009, 05/17/2010   Influenza, High Dose Seasonal PF 05/30/2017, 05/21/2018   Influenza,inj,Quad PF,6+ Mos 05/20/2013, 05/06/2014, 05/17/2015, 05/15/2016, 04/27/2021   Influenza-Unspecified 05/07/2018   PFIZER(Purple Top)SARS-COV-2 Vaccination 09/25/2019, 10/16/2019, 06/04/2020, 01/04/2021, 08/06/2021   Pfizer Covid-19 Vaccine Bivalent Booster 39yrs & up 05/07/2022   Pneumococcal Conjugate-13 11/22/2015, 08/06/2021   Pneumococcal Polysaccharide-23 05/20/2002, 05/07/2008, 06/25/2014   Respiratory Syncytial Virus Vaccine,Recomb Aduvanted(Arexvy) 06/26/2022   Td 09/07/2009   Zoster, Live 09/21/2011    TDAP status: Up to date  Flu Vaccine status: Up to date  Pneumococcal vaccine status: Up to date  Covid-19 vaccine status: Completed vaccines  Qualifies for Shingles Vaccine? Yes   Zostavax completed Yes   Shingrix Completed?: No.    Education has been provided regarding the importance of this vaccine. Patient has been advised to call insurance company to determine out of pocket expense if they have not yet received this vaccine. Advised may also receive vaccine at local pharmacy or Health Dept. Verbalized acceptance and understanding.  Screening Tests Health Maintenance  Topic Date Due   COVID-19 Vaccine (7 - 2023-24 season) 07/02/2022   Medicare Annual Wellness (AWV)  12/20/2022   Colonoscopy  01/07/2023   Zoster Vaccines- Shingrix (1 of 2) 03/28/2023 (Originally 10/01/1969)   INFLUENZA VACCINE  03/21/2023   MAMMOGRAM  03/28/2023   Pneumonia Vaccine 34+ Years old  Completed   DEXA SCAN  Completed   Hepatitis C Screening  Completed   HPV VACCINES  Aged Out   DTaP/Tdap/Td  Discontinued    Health Maintenance  Health Maintenance Due  Topic Date Due   COVID-19 Vaccine (7 - 2023-24 season) 07/02/2022   Medicare Annual Wellness (AWV)  12/20/2022   Colonoscopy   01/07/2023    Colorectal cancer screening: Type of screening: Colonoscopy. Completed 01/06/2018. Repeat every 5 years  Mammogram status: Completed 03/27/2022. Repeat every year  Bone Density status: Completed 06/14/2022.   Lung Cancer Screening: (Low Dose CT Chest recommended if Age 51-80 years, 30 pack-year currently smoking OR have quit w/in 15years.) does not qualify.   Lung Cancer Screening Referral: no  Additional Screening:  Hepatitis C Screening: does qualify; Completed 11/22/2015  Vision Screening: Recommended annual ophthalmology exams for early detection of glaucoma and other disorders of the eye. Is the patient up to date with their annual eye exam?  Yes  Who is the provider or what is the name of the office in which the patient attends annual eye exams? [facility] If pt is not established with a provider, would they like to be referred to a provider to establish care? No .   Dental Screening: Recommended annual dental exams for proper oral hygiene  [facility]  Referral / Chronic Care Management: CRR required this visit?  No   CCM required this visit?  No      Plan:     I have personally reviewed and noted the following in the patient's chart:   Medical and social history Use of alcohol, tobacco or illicit drugs  Current medications and supplements including opioid prescriptions. Patient is not currently taking opioid prescriptions. Functional ability and status Nutritional status Physical activity Advanced directives List of other physicians Hospitalizations, surgeries, and ER visits in previous 12 months Vitals Screenings to include cognitive, depression, and falls Referrals and appointments  In addition, I have reviewed and discussed with patient certain preventive protocols, quality metrics, and best practice recommendations. A written personalized care plan for preventive services as well as general preventive health recommendations were  provided to patient.     Barb Merino, LPN   [id number]   Nurse Notes: none  Due to this being a virtual visit, the after visit summary with patients personalized plan was offered to patient via mail or my-chart. Patient would like to access on my-chart

## 2023-01-15 NOTE — Patient Instructions (Addendum)
[name] , Thank you for taking time to come for your Medicare Wellness Visit. I appreciate your ongoing commitment to your health goals. Please review the following plan we discussed and let me know if I can assist you in the future.   These are the goals we discussed:  Goals      Patient Stated     12/07/2019, I will start back going to the [facility] to work out.      Patient Stated     Lose weight      Patient Stated     01/15/2023, wants to work with pulmonologist and get back feeling better     Weight < 176 lb (79.833 kg)     Starting 12/04/2018, I will attempt to reduce all added sugar in diet and increase exercise for up to 90 min 3 days per week.         This is a list of the screening recommended for you and due dates:  Health Maintenance  Topic Date Due   COVID-19 Vaccine (7 - 2023-24 season) 07/02/2022   Colon Cancer Screening  01/07/2023   Zoster (Shingles) Vaccine (1 of 2) 03/28/2023*   Flu Shot  03/21/2023   Mammogram  03/28/2023   Medicare Annual Wellness Visit  01/15/2024   Pneumonia Vaccine  Completed   DEXA scan (bone density measurement)  Completed   Hepatitis C Screening  Completed   HPV Vaccine  Aged Out   DTaP/Tdap/Td vaccine  Discontinued  *Topic was postponed. The date shown is not the original due date.    Advanced directives: Please bring a copy of your POA (Power of Attorney) and/or Living Will to your next appointment.   Conditions/risks identified: none  Next appointment: Follow up in one year for your annual wellness visit    Preventive Care 65 Years and Older, Female Preventive care refers to lifestyle choices and visits with your health care provider that can promote health and wellness. What does preventive care include? A yearly physical exam. This is also called an annual well check. Dental exams once or twice a year. Routine eye exams. Ask your health care provider how often you should have your eyes checked. Personal lifestyle choices,  including: Daily care of your teeth and gums. Regular physical activity. Eating a healthy diet. Avoiding tobacco and drug use. Limiting alcohol use. Practicing safe sex. Taking low-dose aspirin every day. Taking vitamin and mineral supplements as recommended by your health care provider. What happens during an annual well check? The services and screenings done by your health care provider during your annual well check will depend on your age, overall health, lifestyle risk factors, and family history of disease. Counseling  Your health care provider may ask you questions about your: Alcohol use. Tobacco use. Drug use. Emotional well-being. Home and relationship well-being. Sexual activity. Eating habits. History of falls. Memory and ability to understand (cognition). Work and work Astronomer. Reproductive health. Screening  You may have the following tests or measurements: Height, weight, and BMI. Blood pressure. Lipid and cholesterol levels. These may be checked every 5 years, or more frequently if you are over 53 years old. Skin check. Lung cancer screening. You may have this screening every year starting at age 12 if you have a 30-pack-year history of smoking and currently smoke or have quit within the past 15 years. Fecal occult blood test (FOBT) of the stool. You may have this test every year starting at age 54. Flexible sigmoidoscopy or colonoscopy.  You may have a sigmoidoscopy every 5 years or a colonoscopy every 10 years starting at age 56. Hepatitis C blood test. Hepatitis B blood test. Sexually transmitted disease (STD) testing. Diabetes screening. This is done by checking your blood sugar (glucose) after you have not eaten for a while (fasting). You may have this done every 1-3 years. Bone density scan. This is done to screen for osteoporosis. You may have this done starting at age 48. Mammogram. This may be done every 1-2 years. Talk to your health care provider  about how often you should have regular mammograms. Talk with your health care provider about your test results, treatment options, and if necessary, the need for more tests. Vaccines  Your health care provider may recommend certain vaccines, such as: Influenza vaccine. This is recommended every year. Tetanus, diphtheria, and acellular pertussis (Tdap, Td) vaccine. You may need a Td booster every 10 years. Zoster vaccine. You may need this after age 29. Pneumococcal 13-valent conjugate (PCV13) vaccine. One dose is recommended after age 104. Pneumococcal polysaccharide (PPSV23) vaccine. One dose is recommended after age 65. Talk to your health care provider about which screenings and vaccines you need and how often you need them. This information is not intended to replace advice given to you by your health care provider. Make sure you discuss any questions you have with your health care provider. Document Released: 09/02/2015 Document Revised: 04/25/2016 Document Reviewed: 06/07/2015 Elsevier Interactive Patient Education  2017 [employer].  Fall Prevention in the Home Falls can cause injuries. They can happen to people of all ages. There are many things you can do to make your home safe and to help prevent falls. What can I do on the outside of my home? Regularly fix the edges of walkways and driveways and fix any cracks. Remove anything that might make you trip as you walk through a door, such as a raised step or threshold. Trim any bushes or trees on the path to your home. Use bright outdoor lighting. Clear any walking paths of anything that might make someone trip, such as rocks or tools. Regularly check to see if handrails are loose or broken. Make sure that both sides of any steps have handrails. Any raised decks and porches should have guardrails on the edges. Have any leaves, snow, or ice cleared regularly. Use sand or salt on walking paths during winter. Clean up any spills in  your garage right away. This includes oil or grease spills. What can I do in the bathroom? Use night lights. Install grab bars by the toilet and in the tub and shower. Do not use towel bars as grab bars. Use non-skid mats or decals in the tub or shower. If you need to sit down in the shower, use a plastic, non-slip stool. Keep the floor dry. Clean up any water that spills on the floor as soon as it happens. Remove soap buildup in the tub or shower regularly. Attach bath mats securely with double-sided non-slip rug tape. Do not have throw rugs and other things on the floor that can make you trip. What can I do in the bedroom? Use night lights. Make sure that you have a light by your bed that is easy to reach. Do not use any sheets or blankets that are too big for your bed. They should not hang down onto the floor. Have a firm chair that has side arms. You can use this for support while you get dressed. Do not have  throw rugs and other things on the floor that can make you trip. What can I do in the kitchen? Clean up any spills right away. Avoid walking on wet floors. Keep items that you use a lot in easy-to-reach places. If you need to reach something above you, use a strong step stool that has a grab bar. Keep electrical cords out of the way. Do not use floor polish or wax that makes floors slippery. If you must use wax, use non-skid floor wax. Do not have throw rugs and other things on the floor that can make you trip. What can I do with my stairs? Do not leave any items on the stairs. Make sure that there are handrails on both sides of the stairs and use them. Fix handrails that are broken or loose. Make sure that handrails are as long as the stairways. Check any carpeting to make sure that it is firmly attached to the stairs. Fix any carpet that is loose or worn. Avoid having throw rugs at the top or bottom of the stairs. If you do have throw rugs, attach them to the floor with carpet  tape. Make sure that you have a light switch at the top of the stairs and the bottom of the stairs. If you do not have them, ask someone to add them for you. What else can I do to help prevent falls? Wear shoes that: Do not have high heels. Have rubber bottoms. Are comfortable and fit you well. Are closed at the toe. Do not wear sandals. If you use a stepladder: Make sure that it is fully opened. Do not climb a closed stepladder. Make sure that both sides of the stepladder are locked into place. Ask someone to hold it for you, if possible. Clearly mark and make sure that you can see: Any grab bars or handrails. First and last steps. Where the edge of each step is. Use tools that help you move around (mobility aids) if they are needed. These include: Canes. Walkers. Scooters. Crutches. Turn on the lights when you go into a dark area. Replace any light bulbs as soon as they burn out. Set up your furniture so you have a clear path. Avoid moving your furniture around. If any of your floors are uneven, fix them. If there are any pets around you, be aware of where they are. Review your medicines with your doctor. Some medicines can make you feel dizzy. This can increase your chance of falling. Ask your doctor what other things that you can do to help prevent falls. This information is not intended to replace advice given to you by your health care provider. Make sure you discuss any questions you have with your health care provider. Document Released: 06/02/2009 Document Revised: 01/12/2016 Document Reviewed: 09/10/2014 Elsevier Interactive Patient Education  2017 [employer].

## 2023-01-17 ENCOUNTER — Telehealth: Payer: Self-pay | Admitting: Internal Medicine

## 2023-01-17 ENCOUNTER — Other Ambulatory Visit (INDEPENDENT_AMBULATORY_CARE_PROVIDER_SITE_OTHER): Payer: Medicare Other

## 2023-01-17 DIAGNOSIS — M609 Myositis, unspecified: Secondary | ICD-10-CM

## 2023-01-17 DIAGNOSIS — T466X5A Adverse effect of antihyperlipidemic and antiarteriosclerotic drugs, initial encounter: Secondary | ICD-10-CM | POA: Diagnosis not present

## 2023-01-17 LAB — HEPATIC FUNCTION PANEL
ALT: 49 U/L — ABNORMAL HIGH (ref 0–35)
AST: 43 U/L — ABNORMAL HIGH (ref 0–37)
Albumin: 4.3 g/dL (ref 3.5–5.2)
Alkaline Phosphatase: 137 U/L — ABNORMAL HIGH (ref 39–117)
Bilirubin, Direct: 0 mg/dL (ref 0.0–0.3)
Total Bilirubin: 0.3 mg/dL (ref 0.2–1.2)
Total Protein: 7.8 g/dL (ref 6.0–8.3)

## 2023-01-17 NOTE — Telephone Encounter (Signed)
Patient wanted to inform MR that she had her blood work done today. Also, she states that the grant company needs to hear back from the doctor by 6/8 or she will lose the grant. Please advise.

## 2023-01-17 NOTE — Addendum Note (Signed)
Addended by: Bernerd Pho I on: 01/17/2023 03:02 PM   Modules accepted: Orders

## 2023-01-18 ENCOUNTER — Encounter: Payer: Self-pay | Admitting: Family Medicine

## 2023-01-18 ENCOUNTER — Encounter: Payer: Self-pay | Admitting: Neurology

## 2023-01-18 NOTE — Telephone Encounter (Signed)
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

## 2023-01-21 ENCOUNTER — Telehealth: Payer: Self-pay | Admitting: Internal Medicine

## 2023-01-21 ENCOUNTER — Other Ambulatory Visit: Payer: Self-pay

## 2023-01-21 DIAGNOSIS — R7989 Other specified abnormal findings of blood chemistry: Secondary | ICD-10-CM

## 2023-01-21 MED ORDER — CLOBAZAM 10 MG PO TABS: 10.0000 mg | ORAL_TABLET | Freq: Two times a day (BID) | ORAL | 1 refills | Status: DC

## 2023-01-21 NOTE — Telephone Encounter (Signed)
I spoke to patient about her lab . Patient said that she was told to get her thyroid check at her PCP appt by cardiologist . Patient is concern as to if it was coded correctly. Please look into for patient

## 2023-01-21 NOTE — Telephone Encounter (Signed)
Requested Prescriptions   Pending Prescriptions Disp Refills   cloBAZam (ONFI) 10 MG tablet 180 tablet 1    Sig: Take 1 tablet (10 mg total) by mouth 2 (two) times daily.   Last seen 08/01/22, next appt scheduled 02/27/23 cloBAZam Adherence  Dispenses   Dispensed Days Supply Quantity Provider Pharmacy  clobazam 10 mg tablet 01/17/2023 30 60 tablet Levert Feinstein, MD [facility]...  CLOBAZAM 10MG        TAB 12/21/2022 30 60 each Levert Feinstein, MD [facility]...  CLOBAZAM 10MG        TAB 11/12/2022 30 60 each Levert Feinstein, MD [facility]...  CLOBAZAM 10MG        TAB 10/13/2022 30 60 each Levert Feinstein, MD [facility]...  CLOBAZAM 10MG        TAB 09/02/2022 30 60 each Levert Feinstein, MD [facility].Marland KitchenMarland Kitchen

## 2023-01-21 NOTE — Telephone Encounter (Signed)
Liver function test continues to be marginally elevated.  In 2004 she had like fatty liver and the echocardiogram.  I am kind of nervous prescribing antifibrotic with the scans of elevations.  Plan - At this point get right upper quadrant ultrasound - Refer to GI - Do not start antifibrotic till the above issues are sorted out [really need to protect the liver] -Appreciate her patience    Latest Reference Range & Units 03/27/06 13:45 09/03/06 12:51 07/04/07 09:36 10/09/07 09:26 02/16/08 14:34 08/17/08 10:44 03/04/09 09:27 08/24/09 09:34 01/03/12 11:20 06/18/13 10:01 06/18/14 09:00 12/01/14 08:47 12/29/14 13:00 10/25/15 11:09 11/15/15 08:34 02/03/16 14:15 11/26/16 10:29 02/22/17 08:52 02/27/17 15:18 11/27/17 10:43 01/30/18 15:50 12/04/18 12:40 03/17/19 15:55 05/05/19 11:13 10/01/19 14:08 10/29/19 12:19 12/07/19 10:53 02/15/21 14:29 04/10/21 12:13 04/14/21 10:58 04/26/21 12:41 06/04/21 03:32 06/05/21 04:20 10/19/21 09:11 07/17/22 15:05 08/24/22 13:56 11/22/22 16:04 12/07/22 10:39 01/03/23 14:55 01/17/23 15:02  Alkaline Phosphatase 39 - 117 U/L 107 126 (H)   98 105   98 104 109 127 111 141 (H) 126 (H) 125 112 107 120 100 115 97 121 (H) 94 133 (H) 106 102 115 100 86 83 111 116 122 (H) 84 109 123 (H) 145 (H) 125 (H) 137 (H)  Albumin 3.5 - 5.2 g/dL 3.9 3.7 4.0  3.9 3.8 3.7 3.6 4.0 4.1 3.5 3.9 3.8 4.3 4.1 4.1 4.0 4.1 4.4 4.1 4.2 4.0 4.3 3.9 4.1 4.0 4.2 4.3 3.9 3.9 3.8 2.7 (L) 2.6 (L) 3.9 4.3 4.1 4.0 4.0 4.0 4.3  AST 0 - 37 U/L 34 37 37 36 31 35 32 31 32 33  29 29 33 27 30 33 25 33 36 (H) 37 36 33 42 (H) 34 69 (H) 44 (H) 37 50 (H) 42 (H) 35 35 26 25 25  40 (H) 39 (H) 111 (H) 30 41 (H) 43 (H)  ALT 0 - 35 U/L 34 38 38 (H) 37 (H) 28 28 30 30  32 25 25 33 29 22 27 29 27 35  39 (H) 42 (H) 39 (H) 38 (H) 41 (H) 42 (H) 63 (H) 58 (H) 41 (H) 47 (H) 40 (H) 37 (H) 34 20 19 21  47 (H) 41 (H) 138 (H) 30 38 (H) 49 (H)  Total Protein 6.0 - 8.3 g/dL 7.6 6.9   7.6 7.3   7.3 8.0 7.4 7.5 7.3 7.9 7.4 7.5 7.4 7.5 7.6 7.5 7.9 7.0 7.2 6.9  7.1 7.3 6.8 7.1 7.0 7.0 7.0 6.3 (L) 6.5 6.6 7.4 7.0 7.1 7.3 7.3 7.8  Bilirubin, Direct 0.0 - 0.3 mg/dL  0.1   0.1 0.1                  0.0      0.1      0.1 0.1 0.1 0.1 0.0  Total Bilirubin 0.2 - 1.2 mg/dL 0.5 0.8   0.7 0.5   0.3 0.6 0.5 0.42 0.5 0.4 0.4 0.6 0.4 0.4 0.5 0.4 0.4 0.3 0.3 0.4 0.3 0.4 0.5 0.4 0.4 0.5 0.7 0.8 0.6 0.3 0.5 0.4 0.4 0.3 0.3 0.3  (H): Data is abnormally high (L): Data is abnormally low

## 2023-01-22 NOTE — Telephone Encounter (Signed)
I called and spoke with the pt and notified of results and recommendations from MR  She verbalized understanding  [place] and the GI referral placed

## 2023-01-22 NOTE — Telephone Encounter (Signed)
See telephone message for 01/03/23. Team pharmacy has taken care of grant request. Nothing further needed.

## 2023-01-23 NOTE — Telephone Encounter (Signed)
Pirfenidone new start is pending GI workup.

## 2023-01-25 ENCOUNTER — Ambulatory Visit: Payer: Medicare Other | Attending: Cardiology

## 2023-01-25 DIAGNOSIS — Z01810 Encounter for preprocedural cardiovascular examination: Secondary | ICD-10-CM

## 2023-01-25 NOTE — Progress Notes (Signed)
Virtual Visit via Telephone Note   Because of Knova Gelb Kraker's co-morbid illnesses, she is at least at moderate risk for complications without adequate follow up.  This format is felt to be most appropriate for this patient at this time.  The patient did not have access to video technology/had technical difficulties with video requiring transitioning to audio format only (telephone).  All issues noted in this document were discussed and addressed.  No physical exam could be performed with this format.  Please refer to the patient's chart for her consent to telehealth for [facility].  Evaluation Performed:  Preoperative cardiovascular risk assessment _____________   Date:  01/25/2023   Patient ID:  [name], DOB Jul 26, 1951, MRN [id number] Patient Location:  Home Provider location:   Office  Primary Care Provider:  Georganna Skeans, MD Primary Cardiologist:  Lance Muss, MD  Chief Complaint / Patient Profile   72 y.o. 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 Craige Cotta), fibromyalgia, OA, hyperlipidemia, anxiety, hypothyroidism, GERD who is pending [facility] GONIOTOMY  and presents today for telephonic preoperative cardiovascular risk assessment.  History of Present Illness    [name] is a 72 y.o. female who presents via audio/video conferencing for a telehealth visit today.  Pt was last seen in cardiology clinic on 10/29/2022 by Francis Dowse, PA-C.  At that time [name] was doing well.  The patient is now pending procedure as outlined above. Since her last visit, she tells me that she has not had any new cardiac issues.  She has not had any chest pain.  She always has a little shortness of breath because of her lungs.  She feels like she could walk 1-2 blocks with her walker.  She does go up a  flight of steps carefully and slowly.  She had to hire someone to mow the grass and weed eat.  She can put a flower in a pot and enjoys doing this.  She is worried today that her LFTs are elevated.  She is asking if her CK can cause this.  She did stop her statin and start on Repatha recently.  She is going to get a scan of her liver for further evaluation.  She can hold her aspirin for 5 to 7 days prior to the procedure.  Please resume when medically safe to do so.  Past Medical History    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   Bronchiectasis (HCC)    Cerebral aneurysm, nonruptured    had a clip put in   CHF (congestive heart failure) (HCC)    Clostridium difficile infection    Depressive disorder, not elsewhere classified    Diverticulosis of colon (without mention of hemorrhage)    Esophageal reflux    Fatty liver    Fibromyalgia    Gastritis    GERD (gastroesophageal reflux disease)    GI bleed 2004   Glaucoma    Hiatal hernia    History of COVID-19 05/04/2021   Hyperlipidemia    Hypertension    Hypothyroidism    Internal hemorrhoids    Joint pain    Multifocal pneumonia 06/03/2021  Obstructive sleep apnea (adult) (pediatric)    Osteoarthritis    Ostium secundum type atrial septal defect    Other chronic nonalcoholic liver disease    Other pulmonary embolism and infarction    Palpitations    Paroxysmal ventricular tachycardia (HCC)    Personal history of radiation therapy    PONV (postoperative nausea and vomiting)    Presence of permanent cardiac pacemaker    PUD (peptic ulcer disease)    Radiation 02/03/15-03/10/15   Right Breast   Sarcoid    per pt , not sure   Schatzki's ring    Shortness of breath    Sleep apnea    Stroke [name] 2013   tia/ pt feels it was around 2008 0r 2009    Takotsubo syndrome    Tubular adenoma of colon    Unspecified transient cerebral ischemia    Unspecified vitamin D deficiency    Past Surgical History:  Procedure Laterality Date   ABDOMINAL HYSTERECTOMY  11/22/1998   ABI  2006   normal   BREAST EXCISIONAL BIOPSY     BREAST LUMPECTOMY Right 2016   BREAST LUMPECTOMY WITH RADIOACTIVE SEED LOCALIZATION Right 01/03/2015   Procedure: BREAST LUMPECTOMY WITH RADIOACTIVE SEED LOCALIZATION;  Surgeon: Chevis Pretty III, MD;  Location: [facility] OR;  Service: General;  Laterality: Right;   CARDIAC DEFIBRILLATOR PLACEMENT  2006; 2012   BSX single chamber ICD   carotid dopplers  2006   neg   CEREBRAL ANEURYSM REPAIR  02/1999   COLONOSCOPY     HAMMER TOE SURGERY  11/15/2020   3rd digit bilateral feet    hospitalization  2004   GI bleed, PUD, diverticulosis (EGD,colonscopy)   hospitalization     PE, NSVT, s/p defib   JOINT REPLACEMENT  05/2021   KNEE ARTHROSCOPY     bilateral   LEFT HEART CATHETERIZATION WITH CORONARY ANGIOGRAM N/A 02/22/2012   Procedure: LEFT HEART CATHETERIZATION WITH CORONARY ANGIOGRAM;  Surgeon: Kathleene Hazel, MD;  Location: [facility] CATH LAB;  Service: Cardiovascular;  Laterality: N/A;   PARTIAL HYSTERECTOMY     Fibroids   TONGUE BIOPSY  12/12/2017   due to sore tongue and white patches/abnormal cells   TOTAL KNEE ARTHROPLASTY Left 02/13/2016   Procedure: TOTAL KNEE ARTHROPLASTY;  Surgeon: Dannielle Huh, MD;  Location: [facility] OR;  Service: Orthopedics;  Laterality: Left;   TOTAL KNEE ARTHROPLASTY Right 05/22/2021   Procedure: TOTAL KNEE ARTHROPLASTY;  Surgeon: Dannielle Huh, MD;  Location: [facility] ORS;  Service: Orthopedics;  Laterality: Right;  with block   TUBAL LIGATION  09/20/1978   UPPER GASTROINTESTINAL ENDOSCOPY      Allergies  Allergies  Allergen Reactions   Ace Inhibitors Swelling    Angioedema; makes tongue "break out"    Amitriptyline Hcl Other (See Comments)     makes her too sleepy!   Atorvastatin Other (See Comments)     SEVERE MYALGIA   Dilantin [Phenytoin Sodium Extended] Rash    Severe rash   Duloxetine Hcl Nausea Only and Other (See Comments)    Sleepiness/ sick  Other Reaction(s): GI Intolerance   Paroxetine Nausea Only    Rapid heartbeat   Ramipril Other (See Comments)    TONGUE ULCERS    Rosuvastatin Other (See Comments)    SEVERE MYALGIA   Carbamazepine Rash   Codeine Itching   Paxlovid [Nirmatrelvir-Ritonavir] Itching   Phenytoin Sodium Extended Rash   Simvastatin Other (See Comments)    Increase in CK, myalgias   Wellbutrin [Bupropion] Palpitations  Home Medications    Prior to Admission medications   Medication Sig Start Date End Date Taking? Authorizing Provider  acetaminophen (TYLENOL) 500 MG tablet Take 500 mg by mouth every 6 (six) hours as needed for moderate pain. Patient not taking: Reported on 01/08/2023    [provider]  acetaminophen-codeine (TYLENOL/CODEINE #3) 300-30 MG tablet Take 1 tablet by mouth every 8 (eight) hours as needed for moderate pain. Patient not taking: Reported on 01/08/2023 09/12/22   Genice Rouge, MD  albuterol (PROVENTIL) (2.5 MG/3ML) 0.083% nebulizer solution USE 1 VIAL IN NEBULIZER EVERY 6 HOURS Patient taking differently: Take 2.5 mg by nebulization every 6 (six) hours as needed for shortness of breath. 10/11/20   Coralyn Helling, MD  albuterol (VENTOLIN HFA) 108 (90 Base) MCG/ACT inhaler Inhale 2 puffs into the lungs every 6 (six) hours as needed for wheezing or shortness of breath. 03/02/22   Coralyn Helling, MD  Ascorbic Acid (VITAMIN C) 1000 MG tablet Take 1,000 mg by mouth daily. Patient not taking: Reported on 01/08/2023    [provider]  aspirin EC 81 MG tablet Take 81 mg by mouth daily. Swallow whole.    [provider]  azelastine (ASTELIN) 0.1 % nasal spray Place 1 spray into both nostrils 2 (two) times daily. Use in each nostril as directed Patient not taking: Reported on 01/08/2023 10/24/21   Coralyn Helling, MD   budesonide-formoterol (SYMBICORT) 80-4.5 MCG/ACT inhaler Inhale 1 puff into the lungs 2 (two) times daily. Patient not taking: Reported on 01/08/2023 04/09/22   Coralyn Helling, MD  cefUROXime (CEFTIN) 500 MG tablet Take 1 tablet (500 mg total) by mouth 2 (two) times daily with a meal. Patient not taking: Reported on 01/03/2023 06/26/22   Coralyn Helling, MD  cetirizine (ZYRTEC) 10 MG tablet Take 10 mg by mouth daily as needed for allergies.    [provider]  Cholecalciferol (DIALYVITE VITAMIN D 5000) 125 MCG (5000 UT) capsule Take 5,000 Units by mouth daily.    [provider]  cloBAZam (ONFI) 10 MG tablet Take 1 tablet (10 mg total) by mouth 2 (two) times daily. 01/21/23   Anson Fret, MD  diltiazem (CARDIZEM CD) 120 MG 24 hr capsule Take 1 capsule (120 mg total) by mouth daily. 11/22/22   Duke Salvia, MD  empagliflozin (JARDIANCE) 10 MG TABS tablet Take 1 tablet (10 mg total) by mouth daily. 11/20/22   Duke Salvia, MD  Evolocumab (REPATHA SURECLICK) 140 MG/ML SOAJ Inject 140 mg into the skin every 14 (fourteen) days. 09/10/22   Corky Crafts, MD  famotidine (PEPCID) 40 MG tablet Take 1 tablet (40 mg total) by mouth at bedtime. 01/30/22   Pyrtle, Carie Caddy, MD  fexofenadine (ALLEGRA) 180 MG tablet Take 180 mg by mouth daily as needed for allergies (alternates with zyrtec sometimes).    [provider]  fluticasone (CUTIVATE) 0.05 % cream Apply topically 2 (two) times daily. 08/06/22   [name], NP  fluticasone (FLONASE) 50 MCG/ACT nasal spray Place 2 sprays into both nostrils daily as needed for allergies or rhinitis. 09/15/21   [name], NP  FOLIC ACID PO Take 1 tablet by mouth daily.    [provider]  furosemide (LASIX) 20 MG tablet TAKE 2 TABLETS (40 MG) 3 DAYS PER WEEK AND TAKE 1 TABLET (20 MG) THE OTHER DAYS OF THE WEEK Patient taking differently: Take 20 mg by mouth daily as needed. PER PT TAKES PRN 05/14/22   Eldridge Dace,  Donnie Coffin, MD   gabapentin (NEURONTIN) 100 MG capsule Take 1 capsule (100 mg total) by mouth 3 (three) times daily. 05/04/22   Levert Feinstein, MD  gabapentin (NEURONTIN) 300 MG capsule Take 1 capsule (300 mg total) by mouth at bedtime. 04/04/22   [name], NP  hydroxypropyl methylcellulose / hypromellose (ISOPTO TEARS / GONIOVISC) 2.5 % ophthalmic solution Place 1 drop into both eyes at bedtime.    [provider]  latanoprost (XALATAN) 0.005 % ophthalmic solution Place 1 drop into both eyes at bedtime.    [provider]  levothyroxine (SYNTHROID) 50 MCG tablet Take 1 tablet (50 mcg total) by mouth daily. 12/14/22   Georganna Skeans, MD  magnesium oxide (MAG-OX) 400 MG tablet Take 1 tablet (400 mg total) by mouth 2 (two) times daily. 11/20/21   Duke Salvia, MD  mometasone (ELOCON) 0.1 % lotion Apply 1 application topically daily as needed (rash). Patient not taking: Reported on 01/15/2023 04/27/21   [name], NP  montelukast (SINGULAIR) 10 MG tablet TAKE 1 TABLET AT BEDTIME 07/11/22   Coralyn Helling, MD  Multiple Vitamins-Minerals (MULTIVITAMIN PO) Take 1 tablet by mouth daily.    [provider]  nitroGLYCERIN (NITROSTAT) 0.4 MG SL tablet DISSOLVE ONE TABLET UNDER THE TONGUE EVERY 5 MINUTES AS NEEDED FOR CHEST PAIN.  DO NOT EXCEED A TOTAL OF 3 DOSES IN 15 MINUTES Patient taking differently: Place 0.4 mg under the tongue every 5 (five) minutes as needed for chest pain. 05/04/21   Bhagat, Sharrell Ku, PA  nystatin (MYCOSTATIN) 100000 UNIT/ML suspension Take 5 mLs (500,000 Units total) by mouth 4 (four) times daily. Patient not taking: Reported on 01/15/2023 04/09/22   Coralyn Helling, MD  pantoprazole (PROTONIX) 40 MG tablet TAKE 1 TABLET EVERY DAY 11/22/22   Pyrtle, Carie Caddy, MD  polyethylene glycol powder (GLYCOLAX/MIRALAX) powder Take 17 grams by mouth daily 02/27/16   Pyrtle, Carie Caddy, MD  potassium chloride (KLOR-CON M) 10 MEQ tablet TAKE 1/2 TABLET EVERY DAY (CALL OFFICE TO SCHEDULE YEARLY  APPT) 09/10/22   Corky Crafts, MD  Respiratory Therapy Supplies (FLUTTER) DEVI 1 Device by Does not apply route as needed. 09/22/18   Ivonne Andrew, NP  sodium chloride HYPERTONIC 3 % nebulizer solution Take by nebulization 2 (two) times daily as needed for cough. 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 08/05/19   Coralyn Helling, MD  tiZANidine (ZANAFLEX) 4 MG tablet Take 0.5-1 tablets (2-4 mg total) by mouth every 8 (eight) hours as needed for muscle spasms. 09/12/22   Lovorn, Aundra Millet, MD  traZODone (DESYREL) 100 MG tablet TAKE 1 TO 1 AND 1/2 TABLETS AT BEDTIME 12/12/22   Lovorn, Aundra Millet, MD  triamcinolone cream (KENALOG) 0.1 % Apply 1 Application topically 2 (two) times daily. 08/06/22   [name], NP    Physical Exam    Vital Signs:  [name] does not have vital signs available for review today.  Given telephonic nature of communication, physical exam is limited. AAOx3. NAD. Normal affect.  Speech and respirations are unlabored.  Accessory Clinical Findings    None  Assessment & Plan    1.  Preoperative Cardiovascular Risk Assessment:  Ms. Licano perioperative risk of a major cardiac event is 0.9% according to the Revised Cardiac Risk Index (RCRI).  Therefore, she is at low risk for perioperative complications.   Her functional capacity is fair at 4.64 METs according to the [facility] Activity Status Index (DASI). Recommendations: According to ACC/AHA guidelines, no further cardiovascular testing  needed.  The patient may proceed to surgery at acceptable risk.   Antiplatelet and/or Anticoagulation Recommendations: Aspirin can be held for 5-7 days prior to her surgery.  Please resume Aspirin post operatively when it is felt to be safe from a bleeding standpoint.   The patient was advised that if she develops new symptoms prior to surgery to contact our office to arrange for a follow-up visit, and she verbalized understanding.   A copy of this note will be routed to requesting  surgeon.  Time:   Today, I have spent 10 minutes with the patient with telehealth technology discussing medical history, symptoms, and management plan.     Sharlene Dory, PA-C  01/25/2023, 9:50 AM \

## 2023-01-29 ENCOUNTER — Ambulatory Visit (INDEPENDENT_AMBULATORY_CARE_PROVIDER_SITE_OTHER): Payer: Medicare Other | Admitting: Mental Health

## 2023-01-29 DIAGNOSIS — F330 Major depressive disorder, recurrent, mild: Secondary | ICD-10-CM

## 2023-01-29 NOTE — Progress Notes (Signed)
[facility] Counsel[facility] Initial Adult Exam  Name: [name] Date: 01/29/2023 MRN: [id number] DOB: [birth year] PCP: Colin Ina, FNP  Time spent:  48 minutes  Reason for Visit /Presenting Problem: Patient shared that she is coping with loss of a friend of 18 years. Her son had a heart transplant last March.  She went though a lot watching him go through the procedure. She wants her granddaughter to get a job and more forward with her life. She has lived with her on and off since age 5. She moved out at age 22 for a few years returned in January.  She attends a group counseling at [facility] for about a year.  She states she is dressed often, has a few friends and will go to lunch with them at times.  Reports having limited support and discussing her stressors. Recommended she return to therapy in 2 weeks.  Mental Status Exam:    Appearance:    Casual     Behavior:   Appropriate  Motor:   WNL  Speech/Language:    Clear and Coherent  Affect:   Full range   Mood:   Sad, pleasant  Thought process:   Logical, linear, goal directed  Thought content:     WNL  Sensory/Perceptual disturbances:     none  Orientation:   x4  Attention:   Good  Concentration:   Good  Memory:   Intact  Fund of knowledge:    Consistent with age and development  Insight:     Good  Judgment:    Good  Impulse Control:   Good     Reported Symptoms:  daily dep mood, anxiety, sleep disturbance, rumination  Risk Assessment: Danger to Self:  No Self-injurious Behavior: No Danger to Others: No Duty to Warn:no Physical Aggression / Violence:No  Access to Firearms a concern: No  Gang Involvement:No  Patient / guardian was educated about steps to take if suicide or homicide risk level increases between visits: yes While future psychiatric events cannot be accurately predicted, the patient does not currently require acute inpatient psychiatric care and does not currently meet [facility] involuntary commitment criteria.  Substance Abuse History: Current substance abuse: No     Past Psychiatric History:   Outpatient Providers: unknown- past therapy a  few years ago History of Psych Hospitalization: none Psychological Testing: none  Abuse History: Victim- none reported  Family History:  Family History  Problem Relation Age of Onset   Diabetes Mother    Alzheimer's disease Mother    Hypertension Mother    Obesity Mother    Arthritis Mother    Heart disease Father    Seizures Sister    Allergies Sister    Heart disease Sister    Early death Sister    Other Brother        Thyroid problem 10/2016   Hearing loss Brother    Vision loss Brother    Heart disease Brother    Prostate cancer Brother 58       same brother as throat cancer   Throat cancer Brother        dx in his 52s; also a smoker   Arthritis Brother    Hypertension Brother    Cancer Maternal Grandmother 48       colon cancer or abdominal cancer   Lung cancer Maternal Grandfather 78   Heart disease Son        Cardiac Arrest 07/2016   Breast  cancer Maternal Aunt 55   Colon cancer Maternal Aunt 35       same sister as breast at 25   Breast cancer Maternal Aunt        dx in her 69s   Lung cancer Maternal Uncle    Pancreatic cancer Cousin 82       maternal first cousin   Breast cancer Cousin        paternal first cousin twice removed died in her 87s   Anxiety disorder Sister    Arthritis Sister    Depression Sister    Hypertension Sister    Stroke Neg Hx     Living situation: the patient lives alone and granddaughter lives with her sporadically   Medical History/Surgical History: 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   Bronchiectasis (HCC)    Cerebral aneurysm, nonruptured    had a clip put in    CHF (congestive heart failure) (HCC)    Clostridium difficile infection    Depressive disorder, not elsewhere classified    Diverticulosis of colon (without mention of hemorrhage)    Esophageal reflux    Fatty liver    Fibromyalgia    Gastritis    GERD (gastroesophageal reflux disease)    GI bleed 2004   Glaucoma    Hiatal hernia    History of COVID-19 05/04/2021   Hyperlipidemia    Hypertension    Hypothyroidism    Internal hemorrhoids    Joint pain    Multifocal pneumonia 06/03/2021   Obstructive sleep apnea (adult) (pediatric)    Osteoarthritis    Ostium secundum type atrial septal defect    Other chronic nonalcoholic liver disease    Other pulmonary embolism and infarction    Palpitations    Paroxysmal ventricular tachycardia (HCC)    Personal history of radiation therapy    PONV (postoperative nausea and vomiting)    Presence of permanent cardiac pacemaker    PUD (peptic ulcer disease)    Radiation 02/03/15-03/10/15   Right Breast   Sarcoid    per pt , not sure   Schatzki's ring    Shortness of breath    Sleep apnea    Stroke (HCC) 2013   tia/ pt feels it was around 2008 0r 2009   Takotsubo syndrome    Tubular adenoma of colon    Unspecified transient cerebral ischemia    Unspecified vitamin D deficiency     Past Surgical History:  Procedure Laterality Date   ABDOMINAL HYSTERECTOMY  11/22/1998   ABI  2006   normal   BREAST EXCISIONAL BIOPSY     BREAST LUMPECTOMY Right 2016   BREAST LUMPECTOMY WITH RADIOACTIVE SEED LOCALIZATION Right 01/03/2015   Procedure: BREAST LUMPECTOMY WITH RADIOACTIVE SEED LOCALIZATION;  Surgeon: Chevis Pretty III, MD;  Location: [facility] OR;  Service: General;  Laterality: Right;   CARDIAC DEFIBRILLATOR PLACEMENT  2006; 2012   BSX single chamber ICD   carotid dopplers  2006   neg   CEREBRAL ANEURYSM REPAIR  02/1999   COLONOSCOPY     HAMMER TOE SURGERY  11/15/2020   3rd digit bilateral feet    hospitalization  2004   GI bleed, PUD,  diverticulosis (EGD,colonscopy)   hospitalization     PE, NSVT, s/p defib  JOINT REPLACEMENT  05/2021   KNEE ARTHROSCOPY     bilateral   LEFT HEART CATHETERIZATION WITH CORONARY ANGIOGRAM N/A 02/22/2012   Procedure: LEFT HEART CATHETERIZATION WITH CORONARY ANGIOGRAM;  Surgeon: Kathleene Hazel, MD;  Location: [facility] CATH LAB;  Service: Cardiovascular;  Laterality: N/A;   PARTIAL HYSTERECTOMY     Fibroids   TONGUE BIOPSY  12/12/2017   due to sore tongue and white patches/abnormal cells   TOTAL KNEE ARTHROPLASTY Left 02/13/2016   Procedure: TOTAL KNEE ARTHROPLASTY;  Surgeon: Dannielle Huh, MD;  Location: [facility] OR;  Service: Orthopedics;  Laterality: Left;   TOTAL KNEE ARTHROPLASTY Right 05/22/2021   Procedure: TOTAL KNEE ARTHROPLASTY;  Surgeon: Dannielle Huh, MD;  Location: [facility] ORS;  Service: Orthopedics;  Laterality: Right;  with block   TUBAL LIGATION  09/20/1978   UPPER GASTROINTESTINAL ENDOSCOPY      Medications: Current Outpatient Medications  Medication Sig Dispense Refill   acetaminophen (TYLENOL) 500 MG tablet Take 500 mg by mouth every 6 (six) hours as needed for moderate pain. (Patient not taking: Reported on 01/08/2023)     acetaminophen-codeine (TYLENOL/CODEINE #3) 300-30 MG tablet Take 1 tablet by mouth every 8 (eight) hours as needed for moderate pain. (Patient not taking: Reported on 01/08/2023) 90 tablet 3   albuterol (PROVENTIL) (2.5 MG/3ML) 0.083% nebulizer solution USE 1 VIAL IN NEBULIZER EVERY 6 HOURS (Patient taking differently: Take 2.5 mg by nebulization every 6 (six) hours as needed for shortness of breath.) 120 mL 11   albuterol (VENTOLIN HFA) 108 (90 Base) MCG/ACT inhaler Inhale 2 puffs into the lungs every 6 (six) hours as needed for wheezing or shortness of breath. 18 g 3   Ascorbic Acid (VITAMIN C) 1000 MG tablet Take 1,000 mg by mouth daily. (Patient not taking: Reported on 01/08/2023)     aspirin EC 81 MG tablet Take 81 mg by mouth daily. Swallow whole.     azelastine  (ASTELIN) 0.1 % nasal spray Place 1 spray into both nostrils 2 (two) times daily. Use in each nostril as directed (Patient not taking: Reported on 01/08/2023) 90 mL 3   budesonide-formoterol (SYMBICORT) 80-4.5 MCG/ACT inhaler Inhale 1 puff into the lungs 2 (two) times daily. (Patient not taking: Reported on 01/08/2023) 1 each 12   cefUROXime (CEFTIN) 500 MG tablet Take 1 tablet (500 mg total) by mouth 2 (two) times daily with a meal. (Patient not taking: Reported on 01/03/2023) 14 tablet 0   cetirizine (ZYRTEC) 10 MG tablet Take 10 mg by mouth daily as needed for allergies.     Cholecalciferol (DIALYVITE VITAMIN D 5000) 125 MCG (5000 UT) capsule Take 5,000 Units by mouth daily.     cloBAZam (ONFI) 10 MG tablet Take 1 tablet (10 mg total) by mouth 2 (two) times daily. 180 tablet 1   diltiazem (CARDIZEM CD) 120 MG 24 hr capsule Take 1 capsule (120 mg total) by mouth daily. 90 capsule 3   empagliflozin (JARDIANCE) 10 MG TABS tablet Take 1 tablet (10 mg total) by mouth daily. 90 tablet 3   Evolocumab (REPATHA SURECLICK) 140 MG/ML SOAJ Inject 140 mg into the skin every 14 (fourteen) days. 6 mL 3   famotidine (PEPCID) 40 MG tablet Take 1 tablet (40 mg total) by mouth at bedtime. 360 tablet 6   fexofenadine (ALLEGRA) 180 MG tablet Take 180 mg by mouth daily as needed for allergies (alternates with zyrtec sometimes).     fluticasone (CUTIVATE) 0.05 % cream Apply topically 2 (two) times daily. 60 g  2   fluticasone (FLONASE) 50 MCG/ACT nasal spray Place 2 sprays into both nostrils daily as needed for allergies or rhinitis. 48 g 3   FOLIC ACID PO Take 1 tablet by mouth daily.     furosemide (LASIX) 20 MG tablet TAKE 2 TABLETS (40 MG) 3 DAYS PER WEEK AND TAKE 1 TABLET (20 MG) THE OTHER DAYS OF THE WEEK (Patient taking differently: Take 20 mg by mouth daily as needed. PER PT TAKES PRN) 130 tablet 1   gabapentin (NEURONTIN) 100 MG capsule Take 1 capsule (100 mg total) by mouth 3 (three) times daily. 90 capsule 6    gabapentin (NEURONTIN) 300 MG capsule Take 1 capsule (300 mg total) by mouth at bedtime. 30 capsule 2   hydroxypropyl methylcellulose / hypromellose (ISOPTO TEARS / GONIOVISC) 2.5 % ophthalmic solution Place 1 drop into both eyes at bedtime.     latanoprost (XALATAN) 0.005 % ophthalmic solution Place 1 drop into both eyes at bedtime.     levothyroxine (SYNTHROID) 50 MCG tablet Take 1 tablet (50 mcg total) by mouth daily. 90 tablet 0   magnesium oxide (MAG-OX) 400 MG tablet Take 1 tablet (400 mg total) by mouth 2 (two) times daily. 180 tablet 3   mometasone (ELOCON) 0.1 % lotion Apply 1 application topically daily as needed (rash). (Patient not taking: Reported on 01/15/2023) 60 mL 1   montelukast (SINGULAIR) 10 MG tablet TAKE 1 TABLET AT BEDTIME 90 tablet 3   Multiple Vitamins-Minerals (MULTIVITAMIN PO) Take 1 tablet by mouth daily.     nitroGLYCERIN (NITROSTAT) 0.4 MG SL tablet DISSOLVE ONE TABLET UNDER THE TONGUE EVERY 5 MINUTES AS NEEDED FOR CHEST PAIN.  DO NOT EXCEED A TOTAL OF 3 DOSES IN 15 MINUTES (Patient taking differently: Place 0.4 mg under the tongue every 5 (five) minutes as needed for chest pain.) 25 tablet 0   nystatin (MYCOSTATIN) 100000 UNIT/ML suspension Take 5 mLs (500,000 Units total) by mouth 4 (four) times daily. (Patient not taking: Reported on 01/15/2023) 60 mL 0   pantoprazole (PROTONIX) 40 MG tablet TAKE 1 TABLET EVERY DAY 90 tablet 3   polyethylene glycol powder (GLYCOLAX/MIRALAX) powder Take 17 grams by mouth daily 1080 g 0   potassium chloride (KLOR-CON M) 10 MEQ tablet TAKE 1/2 TABLET EVERY DAY (CALL OFFICE TO SCHEDULE YEARLY APPT) 45 tablet 3   Respiratory Therapy Supplies (FLUTTER) DEVI 1 Device by Does not apply route as needed. 1 each 0   sodium chloride HYPERTONIC 3 % nebulizer solution Take by nebulization 2 (two) times daily as needed for cough. 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 750 mL 12   tiZANidine (ZANAFLEX) 4 MG tablet Take 0.5-1 tablets (2-4 mg total) by mouth every 8  (eight) hours as needed for muscle spasms. 90 tablet 5   traZODone (DESYREL) 100 MG tablet TAKE 1 TO 1 AND 1/2 TABLETS AT BEDTIME 135 tablet 3   triamcinolone cream (KENALOG) 0.1 % Apply 1 Application topically 2 (two) times daily. 453.6 g 0   Current Facility-Administered Medications  Medication Dose Route Frequency Provider Last Rate Last Admin   lidocaine (XYLOCAINE) 1 % (with pres) injection 1 mL  1 mL Other Once Lovorn, Megan, MD       lidocaine (XYLOCAINE) 1 % (with pres) injection 3 mL  3 mL Other Once Lovorn, Megan, MD       triamcinolone acetonide (KENALOG-40) injection 40 mg  40 mg Intramuscular Once Lovorn, Aundra Millet, MD        Allergies  Allergen Reactions  Ace Inhibitors Swelling    Angioedema; makes tongue "break out"    Amitriptyline Hcl Other (See Comments)     makes her too sleepy!   Atorvastatin Other (See Comments)    SEVERE MYALGIA   Dilantin [Phenytoin Sodium Extended] Rash    Severe rash   Duloxetine Hcl Nausea Only and Other (See Comments)    Sleepiness/ sick  Other Reaction(s): GI Intolerance   Paroxetine Nausea Only    Rapid heartbeat   Ramipril Other (See Comments)    TONGUE ULCERS    Rosuvastatin Other (See Comments)    SEVERE MYALGIA   Carbamazepine Rash   Codeine Itching   Paxlovid [Nirmatrelvir-Ritonavir] Itching   Phenytoin Sodium Extended Rash   Simvastatin Other (See Comments)    Increase in CK, myalgias   Wellbutrin [Bupropion] Palpitations    Diagnoses:    ICD-10-CM   1. MDD (major depressive disorder), recurrent episode, mild (HCC)  F33.0       Plan of Care: To be determined, complete part 2 of the assessment next visit.   Waldron Session, [facility]

## 2023-01-30 ENCOUNTER — Ambulatory Visit (HOSPITAL_COMMUNITY)
Admission: RE | Admit: 2023-01-30 | Discharge: 2023-01-30 | Disposition: A | Discharge status: Home / Self Care | Payer: Medicare Other | Source: Ambulatory Visit | Attending: Internal Medicine | Admitting: Internal Medicine

## 2023-01-30 DIAGNOSIS — R7989 Other specified abnormal findings of blood chemistry: Secondary | ICD-10-CM | POA: Diagnosis present

## 2023-01-30 NOTE — Progress Notes (Signed)
Gallstones present and fatty liver. Does not explain increased transaminase. Needs to see GI

## 2023-01-31 ENCOUNTER — Ambulatory Visit (INDEPENDENT_AMBULATORY_CARE_PROVIDER_SITE_OTHER): Payer: Medicare Other | Admitting: Podiatrist

## 2023-01-31 ENCOUNTER — Encounter: Payer: Self-pay | Admitting: Podiatrist

## 2023-01-31 DIAGNOSIS — L84 Corns and callosities: Secondary | ICD-10-CM | POA: Diagnosis not present

## 2023-01-31 NOTE — Telephone Encounter (Signed)
At this point refer to GI

## 2023-01-31 NOTE — Progress Notes (Signed)
Chief Complaint  Patient presents with   Callouses    Pt stated that she has callus on both feet      HPI: Patient is 72 y.o. female who presents today for multiple calluses of both feet.  She relates she comes and met every 3 months and has them trimmed and this is beneficial to help her continue to walk pain-free.   Allergies  Allergen Reactions   Ace Inhibitors Swelling    Angioedema; makes tongue "break out"    Amitriptyline Hcl Other (See Comments)     makes her too sleepy!   Atorvastatin Other (See Comments)    SEVERE MYALGIA   Dilantin [Phenytoin Sodium Extended] Rash    Severe rash   Duloxetine Hcl Nausea Only and Other (See Comments)    Sleepiness/ sick  Other Reaction(s): GI Intolerance   Paroxetine Nausea Only    Rapid heartbeat   Ramipril Other (See Comments)    TONGUE ULCERS    Rosuvastatin Other (See Comments)    SEVERE MYALGIA   Carbamazepine Rash   Codeine Itching   Paxlovid [Nirmatrelvir-Ritonavir] Itching   Phenytoin Sodium Extended Rash   Simvastatin Other (See Comments)    Increase in CK, myalgias   Wellbutrin [Bupropion] Palpitations    Review of systems is negative except as noted in the HPI.  Denies nausea/ vomiting/ fevers/ chills or night sweats.   Denies difficulty breathing, denies calf pain or tenderness  Physical Exam  Patient is awake, alert, and oriented x 3.  In no acute distress.    Vascular status is intact with palpable pedal pulses DP and PT bilateral and capillary refill time less than 3 seconds bilateral.  No edema or erythema noted.   Neurological exam reveals epicritic and protective sensation grossly intact bilateral.   Dermatological exam reveals skin is supple and dry to bilateral feet.  No open lesions present.  Multiple calluses are present located specifically at the left tip of third toe, medial left hallux, medial first metatarsal left, submet 2 left, fifth met base left (=5 left foot) Right medial hallux, plantar  first met head, sub met 2/3, sub fifth met base, dorsal fifth toe right, dorsal right fourth toe. (=6 right foot) Total number of calluses equals 11  Musculoskeletal exam: Musculature intact with dorsiflexion, plantarflexion, inversion, eversion. Ankle and First MPJ joint range of motion normal.     Assessment:   ICD-10-CM   1. Corns and callosities  L84        Plan: Discussed exam findings with the patient at today's visit I trimmed the calluses with a #15 blade without complication and she tolerated this well.  She will be seen back in 3 months for continued care with Dr. Charlsie Merles. **Of note her son had his heart transplant and is doing well.

## 2023-01-31 NOTE — Telephone Encounter (Signed)
The referral to GI was placed on 01/22/23

## 2023-02-05 ENCOUNTER — Ambulatory Visit: Payer: Medicare Other | Admitting: Rheumatology

## 2023-02-05 ENCOUNTER — Encounter: Payer: Self-pay | Admitting: Family Medicine

## 2023-02-05 ENCOUNTER — Encounter: Payer: Self-pay | Admitting: Internal Medicine

## 2023-02-06 ENCOUNTER — Telehealth: Payer: Self-pay | Admitting: Internal Medicine

## 2023-02-06 MED ORDER — FAMOTIDINE 40 MG PO TABS: 40.0000 mg | ORAL_TABLET | Freq: Every day | ORAL | 6 refills | Status: DC

## 2023-02-06 NOTE — Telephone Encounter (Addendum)
Prescription sent to patient pharmacy's pharmacy and patient notified on prescription to make an appt for further refills. Sent in prescription with too many refills in error. Called [facility] and clarified that there is not to be any refills on this prescription. Psychologist, [facility] (pharmacist) took verbal change.

## 2023-02-06 NOTE — Telephone Encounter (Signed)
Dr. Andrey Campanile are you able to check on this

## 2023-02-06 NOTE — Telephone Encounter (Signed)
[facility] mail delivery is calling to have a refill sent in for famotidine

## 2023-02-09 ENCOUNTER — Other Ambulatory Visit: Payer: Self-pay | Admitting: Internal Medicine

## 2023-02-13 ENCOUNTER — Encounter
Payer: Medicare Other | Attending: Physical Medicine and Rehabilitation | Admitting: Physical Medicine and Rehabilitation

## 2023-02-13 ENCOUNTER — Encounter: Payer: Self-pay | Admitting: Physical Medicine and Rehabilitation

## 2023-02-13 VITALS — BP 139/80 | HR 66 | Ht 66.5 in | Wt 226.0 lb

## 2023-02-13 DIAGNOSIS — G8929 Other chronic pain: Secondary | ICD-10-CM | POA: Diagnosis present

## 2023-02-13 DIAGNOSIS — G894 Chronic pain syndrome: Secondary | ICD-10-CM | POA: Diagnosis present

## 2023-02-13 DIAGNOSIS — M62838 Other muscle spasm: Secondary | ICD-10-CM | POA: Insufficient documentation

## 2023-02-13 DIAGNOSIS — M25512 Pain in left shoulder: Secondary | ICD-10-CM | POA: Insufficient documentation

## 2023-02-13 DIAGNOSIS — M797 Fibromyalgia: Secondary | ICD-10-CM | POA: Diagnosis present

## 2023-02-13 MED ORDER — TRAMADOL HCL 50 MG PO TABS: 50.0000 mg | ORAL_TABLET | Freq: Two times a day (BID) | ORAL | 3 refills | Status: DC | PRN

## 2023-02-13 NOTE — Patient Instructions (Signed)
  Plan: Discussed risk and benefits of Tramadol- and can lower seizure- threshold, but has only had focal/partial seizures, so we TOGETHER decided  that can have- but not take more than 1 tab/day.   2.  Needs a new PCP-per pt- suggest talking to PCP about issues-  also needs to help pt get work up for dementia.   3. Being sent to GI for LFT elevation.   4. Plans for ESI- epidural steroid injection by pain mgmt.    5.  Con't Tizanidine 2 mg nightly as needed for pain- shouldn't need a refill.   6. Con't Trazodone 50 mg nightly for sleep- doesn't feel hung over, so unlikely to be cause of memory issues. - con't med as needed- has refills.   7. F/U in 3months- can call and get on wait list for injections if she wants them. 2-3 weeks from back injection before I'll inject shoulder.

## 2023-02-13 NOTE — Progress Notes (Signed)
Patient is a 72 yr old R handed female with hx of  Bronchiesctasis, CHF, prediabetes- with A1c of 5.6- as of 02/15/21- - not on blood thinners, has a defibrillator, GERD, and fibromyalgia issues- dx'd 10 years ago.  Here for f/u on chronic pain and trigger oint injections. Last got subacromial shoulder injections B/L on 03/29/21.  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  Here for f/u on pain.   Had cataracts surgery on R yesterday so eye covered.   Stopped taking Tylenol with codeine- since LFTs skyrocketed- was taking 1-2  tab/day- but stopped taking and LFTs down to 41.   Being sent to GI to see why.     Things confusing sometimes- repeats self-  Doesn't take Gabapentin as much- none in last month- when takes will take 300 mg as needed   Has nothing can take- cannot take tylenol- cannot take NSAIDs- and cannot take Tramadol due to seizures.   Esp when back hurts, or has HA's- recently, having more HA's than ever-  Couple times per week-  And back and leg hurts all the time.   When got PT- helped back and buttock pain A LOT!  Was Dx'd with Seizures in December- on L side- posturing- shaking/trembling- however didn' pass out.   Mother had dementia- and is scared that might have it. Because forgetful a lot lately.   Has already gotten injections- L hip bursa it sounds like- and plan to get in back soon.     Plan: Discussed risk and benefits of Tramadol- and can lower seizure- threshold, but has only had focal/partial seizures, so we TOGETHER decided  that can have- but not take more than 1 tab/day.   2.  Needs a new PCP-per pt- suggest talking to PCP about issues-  also needs to help pt get work up for dementia.   3. Being sent to GI for LFT elevation.   4. Plans for ESI- epidural steroid injection by pain mgmt.    5.  Con't Tizanidine 2 mg nightly as needed for pain- shouldn't need a refill.   6. Con't Trazodone 50 mg nightly for sleep-  doesn't feel hung over, so unlikely to be cause of memory issues. - con't med as needed- has refills.   7. F/U in 3months- can call and get on wait list for injections if she wants them. 2-3 weeks from back injection before I'll inject shoulder.    I spent a total of 31   minutes on total care today- >50% coordination of care- due to discussion with pt about memory issues; as well as how ot manage pain discussed many options- decided to try low dose tramadol for now.

## 2023-02-15 ENCOUNTER — Telehealth: Payer: Self-pay | Admitting: Internal Medicine

## 2023-02-15 ENCOUNTER — Telehealth: Payer: Self-pay

## 2023-02-15 NOTE — Telephone Encounter (Signed)
Newt Lukes (Key: [id number]) - [id number] traMADol HCl 50MG  tablets Status: PA Response - ApprovedCreated: June 26th, 2024 [id number]

## 2023-02-15 NOTE — Telephone Encounter (Signed)
PA for Tramadol 50 MG APPROVED UNTIL 08/20/2023. PHARMACY INFORMED.

## 2023-02-15 NOTE — Telephone Encounter (Signed)
Patient called requesting to speak with a nurse regarding elevated LFT's.

## 2023-02-18 HISTORY — PX: EYE SURGERY: SHX253

## 2023-02-18 NOTE — Telephone Encounter (Signed)
Pyrtle Pt states her pulmonary doc wants to start her on some medication but drew some labs and her LFT's are elevated. They have come down some but he wants her to be seen regarding Elevated labs. Pt scheduled to see Dr. Leone Payor 02/22/23 at 2:30pm. Pt aware of appt.

## 2023-02-19 ENCOUNTER — Other Ambulatory Visit: Payer: Self-pay

## 2023-02-19 ENCOUNTER — Ambulatory Visit (INDEPENDENT_AMBULATORY_CARE_PROVIDER_SITE_OTHER): Payer: Medicare Other | Admitting: Primary Care

## 2023-02-19 ENCOUNTER — Encounter: Payer: Self-pay | Admitting: Primary Care

## 2023-02-19 ENCOUNTER — Ambulatory Visit (INDEPENDENT_AMBULATORY_CARE_PROVIDER_SITE_OTHER): Payer: Medicare Other | Admitting: Internal Medicine

## 2023-02-19 VITALS — BP 136/72 | HR 67 | Temp 97.9°F | Ht 66.5 in | Wt 226.2 lb

## 2023-02-19 DIAGNOSIS — G4733 Obstructive sleep apnea (adult) (pediatric): Secondary | ICD-10-CM | POA: Diagnosis not present

## 2023-02-19 DIAGNOSIS — J84112 Idiopathic pulmonary fibrosis: Secondary | ICD-10-CM | POA: Diagnosis not present

## 2023-02-19 LAB — PULMONARY FUNCTION TEST
DL/VA % pred: 94 %
DL/VA: 3.83 ml/min/mmHg/L
DLCO cor % pred: 56 %
DLCO cor: 11.85 ml/min/mmHg
DLCO unc % pred: 56 %
DLCO unc: 11.85 ml/min/mmHg
FEF 25-75 Pre: 1.91 L/sec
FEF2575-%Pred-Pre: 103 %
FEV1-%Pred-Pre: 82 %
FEV1-Pre: 1.97 L
FEV1FVC-%Pred-Pre: 116 %
FEV6-%Pred-Pre: 74 %
FEV6-Pre: 2.25 L
FEV6FVC-%Pred-Pre: 104 %
FVC-%Pred-Pre: 71 %
FVC-Pre: 2.25 L
Pre FEV1/FVC ratio: 88 %
Pre FEV6/FVC Ratio: 100 %

## 2023-02-19 MED ORDER — TIZANIDINE HCL 4 MG PO TABS: 2.0000 mg | ORAL_TABLET | Freq: Three times a day (TID) | ORAL | 5 refills | Status: DC | PRN

## 2023-02-19 MED ORDER — PROMETHAZINE-DM 6.25-15 MG/5ML PO SYRP: 5.0000 mL | ORAL_SOLUTION | Freq: Four times a day (QID) | ORAL | 0 refills | Status: DC | PRN

## 2023-02-19 NOTE — Patient Instructions (Addendum)
Send [place] a Clinical cytogeneticist message after seeing GI Use hypertonic saline twice a day followed by flutter valve [employer] will reach out to you to discuss research opportunities further   Orders: CPAP titration study re: OSA  Follow-up: - Needs 6 weeks follow-up with Dr. [name] ([facility] clinic) - Needs to establish with Dr. Wynona Neat for sleep apnea in 4-8 weeks after CPAP titration study

## 2023-02-19 NOTE — Progress Notes (Addendum)
@Patient  ID: [name], female    DOB: 1951-05-11, 72 y.o.   MRN: [id number]  Chief Complaint  Patient presents with   Follow-up    Has not started pirfenidone.    Referring provider: Georganna Skeans, MD  HPI: 71 year old female, former smoker.  Medical history significant for IPF managed by Dr. [name].   Previous [facility] pulmonary encounter:  01/03/23- Dr. [name]   PFT shows 9% decline over 4 years. CT scan has changed 2013/2020 -> Nov 2023 ad current CT Nov 2023 more consistent with interstitial lung disease/pulmonary fibrosis.  Based on age greater than 60, bilateral lower lobe crackles, slow progression, normal blood work findings on CT scan I think the specific variety of pulmonary fibrosis IPF otherwise call idiopathic pulmonary fibrosis.  The diagnose of IPF has also been agreed upon by the case conference.  Recently liver enzymes were abnormal because of Tylenol intake but a month ago they normalized   PLAN  -Check liver function test today 01/03/2023 I had of starting pirfenidone   -Start pirfenidone per protocol [discussed again]   -Discussed benefits, risks and other option of nintedanib in detail  -Address back pain issues with primary care doctor  -Do spirometry and DLCO do spirometry and DLCO in 6 weeks  -Discuss clinical research as a care option in 6 weeks  Followup  -6 weeks video visit or face-to-face visit with nurse practitioner to review uptake with pirfenidone    02/19/2023- Esbriet check in  Patient presents today for follow-up after initiation of anti-fibrotics. Clinically her symptoms have not changed over the last 6 weeks. She has a chronic cough. Spirometry with DLCO- showed 9% decline in FEV1 over the last 4 years. CT scan has changed 2013/2020 -> Nov 2023 ad current CT Nov 2023 more consistent with interstitial lung disease/pulmonary fibrosis. Given dx IPF in April 2024 and plan was to start patient on antifibrotics, however, LFTs were elevated  on lab testing. She was told to hold off on starting Esbriet. Abdominal ultrasound showed cholelithiasis without signs of acute cholecystitis; increased hepatic parenchymal echogenicity suggestive of steatosis. She has an apt with GI this Friday. She is interested in clinical research for treatment of ILD.    Allergies  Allergen Reactions   Ace Inhibitors Swelling    Angioedema; makes tongue "break out"    Amitriptyline Hcl Other (See Comments)     makes her too sleepy!   Atorvastatin Other (See Comments)    SEVERE MYALGIA   Dilantin [Phenytoin Sodium Extended] Rash    Severe rash   Duloxetine Hcl Nausea Only and Other (See Comments)    Sleepiness/ sick  Other Reaction(s): GI Intolerance   Paroxetine Nausea Only    Rapid heartbeat   Ramipril Other (See Comments)    TONGUE ULCERS    Rosuvastatin Other (See Comments)    SEVERE MYALGIA   Carbamazepine Rash   Codeine Itching   Paxlovid [Nirmatrelvir-Ritonavir] Itching   Phenytoin Sodium Extended Rash   Simvastatin Other (See Comments)    Increase in CK, myalgias   Wellbutrin [Bupropion] Palpitations    Immunization History  Administered Date(s) Administered   Fluad Quad(high Dose 65+) 04/17/2019, 04/30/2020   H1N1 07/24/2008   Influenza Split 07/17/2011, 09/27/2011, 04/29/2012   Influenza Whole 07/20/2004, 07/04/2007, 05/07/2008, 05/19/2009, 05/17/2010   Influenza, High Dose Seasonal PF 05/30/2017, 05/21/2018   Influenza,inj,Quad PF,6+ Mos 05/20/2013, 05/06/2014, 05/17/2015, 05/15/2016, 04/27/2021   Influenza-Unspecified 05/07/2018   PFIZER(Purple Top)SARS-COV-2 Vaccination 09/25/2019, 10/16/2019, 06/04/2020, 01/04/2021, 08/06/2021  [employer], [employer] 71yrs & up 05/07/2022   Pneumococcal Conjugate-13 11/22/2015, 08/06/2021   Pneumococcal Polysaccharide-23 05/20/2002, 05/07/2008, 06/25/2014   Respiratory Syncytial Virus Vaccine,Recomb Aduvanted(Arexvy) 06/26/2022   Td 09/07/2009   Zoster, Live  09/21/2011    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   Bronchiectasis (HCC)    Cerebral aneurysm, nonruptured    had a clip put in   CHF (congestive heart failure) (HCC)    Clostridium difficile infection    Depressive disorder, not elsewhere classified    Diverticulosis of colon (without mention of hemorrhage)    Esophageal reflux    Fatty liver    Fibromyalgia    Gastritis    GERD (gastroesophageal reflux disease)    GI bleed 2004   Glaucoma    Hiatal hernia    History of COVID-19 05/04/2021   Hyperlipidemia    Hypertension    Hypothyroidism    Internal hemorrhoids    Joint pain    Multifocal pneumonia 06/03/2021   Obstructive sleep apnea (adult) (pediatric)    Osteoarthritis    Ostium secundum type atrial septal defect    Other chronic nonalcoholic liver disease    Other pulmonary embolism and infarction    Palpitations    Paroxysmal ventricular tachycardia (HCC)    Personal history of radiation therapy    PONV (postoperative nausea and vomiting)    Presence of permanent cardiac pacemaker    PUD (peptic ulcer disease)    Radiation 02/03/15-03/10/15   Right Breast   Sarcoid    per pt , not sure   Schatzki's ring    Shortness of breath    Sleep apnea    Stroke (HCC) 2013   tia/ pt feels it was around 2008 0r 2009   Takotsubo syndrome    Tubular adenoma of colon    Unspecified transient cerebral ischemia    Unspecified vitamin D deficiency     Tobacco History: Social History   Tobacco Use  Smoking Status Former   Packs/day: 1.00   Years: 20.00   Additional pack years: 0.00   Total pack years: 20.00   Types: Cigarettes   Quit date: 08/20/1989   Years since quitting: 33.5   Passive exposure: Past  Smokeless Tobacco Never  Tobacco Comments   Mother, brother and sister    Counseling given: Not Answered Tobacco comments: Mother, brother and sister   Outpatient Medications Prior to Visit  Medication Sig Dispense Refill   albuterol (PROVENTIL) (2.5 MG/3ML) 0.083% nebulizer solution USE 1 VIAL IN NEBULIZER EVERY 6 HOURS (Patient taking differently: Take 2.5 mg by nebulization every 6 (six) hours as needed for shortness of breath.) 120 mL 11   albuterol (VENTOLIN HFA) 108 (90 Base) MCG/ACT inhaler Inhale 2 puffs into the lungs every 6 (six) hours as needed for wheezing or shortness of breath. 18 g 3   Ascorbic Acid (VITAMIN C) 1000 MG tablet Take 1,000 mg by mouth daily.     aspirin EC 81 MG tablet Take 81 mg by mouth daily. Swallow whole.     azelastine (ASTELIN) 0.1 % nasal spray Place 1 spray into both nostrils 2 (two) times daily. Use in each nostril as directed 90 mL 3   budesonide-formoterol (SYMBICORT) 80-4.5 MCG/ACT inhaler Inhale 1 puff into the  lungs 2 (two) times daily. 1 each 12   cefUROXime (CEFTIN) 500 MG tablet Take 1 tablet (500 mg total) by mouth 2 (two) times daily with a meal. 14 tablet 0   cetirizine (ZYRTEC) 10 MG tablet Take 10 mg by mouth daily as needed for allergies.     Cholecalciferol (DIALYVITE VITAMIN D 5000) 125 MCG (5000 UT) capsule Take 5,000 Units by mouth daily.     cloBAZam (ONFI) 10 MG tablet Take 1 tablet (10 mg total) by mouth 2 (two) times daily. 180 tablet 1   diltiazem (CARDIZEM CD) 120 MG 24 hr capsule Take 1 capsule (120 mg total) by mouth daily. 90 capsule 3   empagliflozin (JARDIANCE) 10 MG TABS tablet Take 1 tablet (10 mg total) by mouth daily. 90 tablet 3   Evolocumab (REPATHA SURECLICK) 140 MG/ML SOAJ Inject 140 mg into the skin every 14 (fourteen) days. 6 mL 3   famotidine (PEPCID) 40 MG tablet Take 1 tablet (40 mg total) by mouth at bedtime. NEEDS APPT FOR FURTHER REFILLS 90 tablet 6   fexofenadine (ALLEGRA) 180 MG tablet Take 180 mg by mouth daily as needed for allergies (alternates with zyrtec sometimes).      fluticasone (CUTIVATE) 0.05 % cream Apply topically 2 (two) times daily. 60 g 2   fluticasone (FLONASE) 50 MCG/ACT nasal spray Place 2 sprays into both nostrils daily as needed for allergies or rhinitis. 48 g 3   FOLIC ACID PO Take 1 tablet by mouth daily.     furosemide (LASIX) 20 MG tablet TAKE 2 TABLETS (40 MG) 3 DAYS PER WEEK AND TAKE 1 TABLET (20 MG) THE OTHER DAYS OF THE WEEK (Patient taking differently: Take 20 mg by mouth daily as needed. PER PT TAKES PRN) 130 tablet 1   gabapentin (NEURONTIN) 100 MG capsule Take 1 capsule (100 mg total) by mouth 3 (three) times daily. 90 capsule 6   gabapentin (NEURONTIN) 300 MG capsule Take 1 capsule (300 mg total) by mouth at bedtime. 30 capsule 2   hydroxypropyl methylcellulose / hypromellose (ISOPTO TEARS / GONIOVISC) 2.5 % ophthalmic solution Place 1 drop into both eyes at bedtime.     ketorolac (ACULAR) 0.5 % ophthalmic solution Place 1 drop into the right eye 4 (four) times daily.     latanoprost (XALATAN) 0.005 % ophthalmic solution Place 1 drop into both eyes at bedtime.     levothyroxine (SYNTHROID) 50 MCG tablet Take 1 tablet (50 mcg total) by mouth daily. 90 tablet 0   magnesium oxide (MAG-OX) 400 (240 Mg) MG tablet TAKE 1 TABLET TWICE DAILY 180 tablet 3   mometasone (ELOCON) 0.1 % lotion Apply 1 application topically daily as needed (rash). 60 mL 1   montelukast (SINGULAIR) 10 MG tablet TAKE 1 TABLET AT BEDTIME 90 tablet 3   Multiple Vitamins-Minerals (MULTIVITAMIN PO) Take 1 tablet by mouth daily.     nitroGLYCERIN (NITROSTAT) 0.4 MG SL tablet DISSOLVE ONE TABLET UNDER THE TONGUE EVERY 5 MINUTES AS NEEDED FOR CHEST PAIN.  DO NOT EXCEED A TOTAL OF 3 DOSES IN 15 MINUTES (Patient taking differently: Place 0.4 mg under the tongue every 5 (five) minutes as needed for chest pain.) 25 tablet 0   nystatin (MYCOSTATIN) 100000 UNIT/ML suspension Take 5 mLs (500,000 Units total) by mouth 4 (four) times daily. 60 mL 0   ofloxacin (OCUFLOX) 0.3 % ophthalmic  solution Place 1 drop into the right eye 4 (four) times daily.     pantoprazole (PROTONIX) 40 MG tablet  TAKE 1 TABLET EVERY DAY 90 tablet 3   polyethylene glycol powder (GLYCOLAX/MIRALAX) powder Take 17 grams by mouth daily 1080 g 0   potassium chloride (KLOR-CON M) 10 MEQ tablet TAKE 1/2 TABLET EVERY DAY (CALL OFFICE TO SCHEDULE YEARLY APPT) 45 tablet 3   prednisoLONE acetate (PRED FORTE) 1 % ophthalmic suspension Place 1 drop into the right eye 4 (four) times daily.     Respiratory Therapy Supplies (FLUTTER) DEVI 1 Device by Does not apply route as needed. 1 each 0   sodium chloride HYPERTONIC 3 % nebulizer solution Take by nebulization 2 (two) times daily as needed for cough. 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 750 mL 12   traMADol (ULTRAM) 50 MG tablet Take 1 tablet (50 mg total) by mouth every 12 (twelve) hours as needed. 45 tablet 3   traZODone (DESYREL) 100 MG tablet TAKE 1 TO 1 AND 1/2 TABLETS AT BEDTIME 135 tablet 3   triamcinolone cream (KENALOG) 0.1 % Apply 1 Application topically 2 (two) times daily. 453.6 g 0   tiZANidine (ZANAFLEX) 4 MG tablet Take 0.5-1 tablets (2-4 mg total) by mouth every 8 (eight) hours as needed for muscle spasms. 90 tablet 5   acetaminophen (TYLENOL) 500 MG tablet Take 500 mg by mouth every 6 (six) hours as needed for moderate pain.     acetaminophen-codeine (TYLENOL/CODEINE #3) 300-30 MG tablet Take 1 tablet by mouth every 8 (eight) hours as needed for moderate pain. 90 tablet 3   Facility-Administered Medications Prior to Visit  Medication Dose Route Frequency Provider Last Rate Last Admin   lidocaine (XYLOCAINE) 1 % (with pres) injection 1 mL  1 mL Other Once Lovorn, Megan, MD       lidocaine (XYLOCAINE) 1 % (with pres) injection 3 mL  3 mL Other Once Lovorn, Megan, MD       triamcinolone acetonide (KENALOG-40) injection 40 mg  40 mg Intramuscular Once Lovorn, Aundra Millet, MD        Review of Systems  Review of Systems  Constitutional: Negative.   Respiratory:  Positive  for cough.    Physical Exam  BP 136/72 (BP Location: Left Arm, Patient Position: Sitting, Cuff Size: Large)   Pulse 67   Temp 97.9 F (36.6 C) (Oral)   Ht 5' 6.5" (1.689 m)   Wt 226 lb 3.2 oz (102.6 kg)   LMP  (LMP Unknown)   SpO2 97%   BMI 35.96 kg/m  Physical Exam Constitutional:      General: She is not in acute distress.    Appearance: Normal appearance. She is not ill-appearing.  HENT:     Head: Normocephalic and atraumatic.     Mouth/Throat:     Mouth: Mucous membranes are moist.     Pharynx: Oropharynx is clear.  Cardiovascular:     Rate and Rhythm: Normal rate and regular rhythm.  Pulmonary:     Effort: Pulmonary effort is normal. No respiratory distress.     Breath sounds: Rales present. No wheezing.  Musculoskeletal:        General: Normal range of motion.  Skin:    General: Skin is warm and dry.  Neurological:     General: No focal deficit present.     Mental Status: She is alert and oriented to person, place, and time. Mental status is at baseline.  Psychiatric:        Mood and Affect: Mood normal.        Behavior: Behavior normal.  Thought Content: Thought content normal.        Judgment: Judgment normal.      Lab Results:  CBC    Component Value Date/Time   WBC 4.7 08/24/2022 1356   RBC 4.67 08/24/2022 1356   HGB 14.2 08/24/2022 1356   HGB 13.2 11/20/2021 1452   HGB 12.4 12/01/2014 0845   HCT 44.0 08/24/2022 1356   HCT 40.3 11/20/2021 1452   HCT 38.8 12/01/2014 0845   PLT 208.0 08/24/2022 1356   PLT 225 11/20/2021 1452   MCV 94.2 08/24/2022 1356   MCV 89 11/20/2021 1452   MCV 88.5 12/01/2014 0845   MCH 29.2 11/20/2021 1452   MCH 28.9 06/05/2021 0420   MCHC 32.2 08/24/2022 1356   RDW 13.1 08/24/2022 1356   RDW 12.7 11/20/2021 1452   RDW 12.7 12/01/2014 0845   LYMPHSABS 1.0 08/24/2022 1356   LYMPHSABS 1.1 11/20/2021 1452   LYMPHSABS 1.0 12/01/2014 0845   MONOABS 0.4 08/24/2022 1356   MONOABS 0.3 12/01/2014 0845   EOSABS 0.1  08/24/2022 1356   EOSABS 0.2 11/20/2021 1452   EOSABS 0.1 03/13/2007 1249   BASOSABS 0.0 08/24/2022 1356   BASOSABS 0.0 11/20/2021 1452   BASOSABS 0.0 12/01/2014 0845    BMET    Component Value Date/Time   NA 141 08/24/2022 1356   NA 138 03/23/2022 1553   NA 141 12/01/2014 0847   K 4.4 08/24/2022 1356   K 3.8 12/01/2014 0847   CL 104 08/24/2022 1356   CO2 31 08/24/2022 1356   CO2 26 12/01/2014 0847   GLUCOSE 78 08/24/2022 1356   GLUCOSE 175 (H) 12/01/2014 0847   BUN 16 08/24/2022 1356   BUN 10 03/23/2022 1553   BUN 10.6 12/01/2014 0847   CREATININE 0.80 08/24/2022 1356   CREATININE 0.82 02/27/2017 1518   CREATININE 0.8 12/01/2014 0847   CALCIUM 9.9 08/24/2022 1356   CALCIUM 9.8 12/01/2014 0847   GFRNONAA >60 06/07/2021 0359   GFRNONAA 74 02/27/2017 1518   GFRAA 82 10/01/2019 1408   GFRAA 85 02/27/2017 1518    BNP    Component Value Date/Time   BNP 24.6 06/03/2021 0958   BNP 30.2 08/16/2016 1452    ProBNP    Component Value Date/Time   PROBNP 76 08/24/2022 1356    Imaging: US Abdomen Limited RUQ (LIVER/GB)  Result Date: 01/30/2023 CLINICAL DATA:  Elevated LFTs EXAM: ULTRASOUND ABDOMEN LIMITED RIGHT UPPER QUADRANT COMPARISON:  CT abdomen and pelvis 02/11/2018 FINDINGS: Gallbladder: Multiple stones in the gallbladder lumen. No gallbladder wall thickening or pericholecystic fluid. Negative sonographic Murphy's sign. Common bile duct: Diameter: 3.9 mm Liver: Increased echogenicity. No focal lesion. Portal vein is patent on color Doppler imaging with normal direction of blood flow towards the liver. Other: None. IMPRESSION: 1. Cholelithiasis without secondary signs of acute cholecystitis. 2. Increased hepatic parenchymal echogenicity suggestive of steatosis. Electronically Signed   By: Annia Belt M.D.   On: 01/30/2023 08:52     Assessment & Plan:   IPF (idiopathic pulmonary fibrosis) (HCC) CT scan has changed 2013/2020 -> Nov 2023 ad current CT Nov 2023 more  consistent with interstitial lung disease/pulmonary fibrosis.  Based on age greater than 60, bilateral lower lobe crackles, slow progression, normal blood work findings on CT scan I think the specific variety of pulmonary fibrosis IPF otherwise call idiopathic pulmonary fibrosis.  The diagnose of IPF has also been agreed upon by the case conference.  - Spirometry today showed 6 % decline in FEV1 since January  2024, however, diffusion capacity has improved slightly  - Esbriet is currently on hold (was never started) due to elevated LFTs. Planning on seeing GI. - Advised she start using hypertonic saline twice a day followed by flutter valve for cough.  - [employer] will reach out to patient to discuss research opportunities   Follow-up: - Needs 6 weeks follow-up with Dr. [name] ([facility] clinic)  Obstructive sleep apnea - Not tolerating CPAP due to pressure settings and mask fit. Needs CPAP titration study and establish with Dr. Wynona Neat for management   Glenford Bayley, NP 02/25/2023

## 2023-02-19 NOTE — Progress Notes (Signed)
Spirometry and DLCO completed today  ?

## 2023-02-20 ENCOUNTER — Other Ambulatory Visit: Payer: Self-pay | Admitting: Physical Medicine and Rehabilitation

## 2023-02-20 ENCOUNTER — Other Ambulatory Visit: Payer: Self-pay | Admitting: *Deleted

## 2023-02-20 MED ORDER — TIZANIDINE HCL 4 MG PO TABS: 2.0000 mg | ORAL_TABLET | Freq: Two times a day (BID) | ORAL | 1 refills | Status: DC

## 2023-02-22 ENCOUNTER — Other Ambulatory Visit: Payer: Self-pay | Admitting: Neurology

## 2023-02-22 ENCOUNTER — Ambulatory Visit (INDEPENDENT_AMBULATORY_CARE_PROVIDER_SITE_OTHER): Payer: Medicare Other | Admitting: Internal Medicine

## 2023-02-22 ENCOUNTER — Encounter: Payer: Self-pay | Admitting: Internal Medicine

## 2023-02-22 ENCOUNTER — Other Ambulatory Visit (INDEPENDENT_AMBULATORY_CARE_PROVIDER_SITE_OTHER): Payer: Medicare Other

## 2023-02-22 VITALS — BP 130/78 | HR 72 | Ht 66.0 in | Wt 229.0 lb

## 2023-02-22 DIAGNOSIS — R7989 Other specified abnormal findings of blood chemistry: Secondary | ICD-10-CM

## 2023-02-22 DIAGNOSIS — R748 Abnormal levels of other serum enzymes: Secondary | ICD-10-CM | POA: Diagnosis not present

## 2023-02-22 DIAGNOSIS — Z9981 Dependence on supplemental oxygen: Secondary | ICD-10-CM

## 2023-02-22 DIAGNOSIS — Z86010 Personal history of colonic polyps: Secondary | ICD-10-CM

## 2023-02-22 DIAGNOSIS — R1011 Right upper quadrant pain: Secondary | ICD-10-CM

## 2023-02-22 DIAGNOSIS — E669 Obesity, unspecified: Secondary | ICD-10-CM

## 2023-02-22 DIAGNOSIS — J84112 Idiopathic pulmonary fibrosis: Secondary | ICD-10-CM

## 2023-02-22 DIAGNOSIS — K8020 Calculus of gallbladder without cholecystitis without obstruction: Secondary | ICD-10-CM

## 2023-02-22 LAB — HEPATIC FUNCTION PANEL
ALT: 54 U/L — ABNORMAL HIGH (ref 0–35)
AST: 39 U/L — ABNORMAL HIGH (ref 0–37)
Albumin: 4.1 g/dL (ref 3.5–5.2)
Alkaline Phosphatase: 113 U/L (ref 39–117)
Bilirubin, Direct: 0.1 mg/dL (ref 0.0–0.3)
Total Bilirubin: 0.4 mg/dL (ref 0.2–1.2)
Total Protein: 7.3 g/dL (ref 6.0–8.3)

## 2023-02-22 LAB — CK: Total CK: 215 U/L — ABNORMAL HIGH (ref 7–177)

## 2023-02-22 NOTE — Patient Instructions (Signed)
_______________________________________________________  If your blood pressure at your visit was 140/90 or greater, please contact your primary care physician to follow up on this.  _______________________________________________________  If you are age 74 or older, your body mass index should be between 23-30. Your Body mass index is 36.96 kg/m. If this is out of the aforementioned range listed, please consider follow up with your Primary Care Provider.  If you are age 71 or younger, your body mass index should be between 19-25. Your Body mass index is 36.96 kg/m. If this is out of the aformentioned range listed, please consider follow up with your Primary Care Provider.   ________________________________________________________  The [facility] providers would like to encourage you to use [facility] to communicate with providers for non-urgent requests or questions.  Due to long hold times on the telephone, sending your provider a message by [facility] may be a faster and more efficient way to get a response.  Please allow 48 business hours for a response.  Please remember that this is for non-urgent requests.  _______________________________________________________  Your provider has requested that you go to the basement level for lab work before leaving today. Press "B" on the elevator. The lab is located at the first door on the left as you exit the elevator.  Due to recent changes in healthcare laws, you may see the results of your imaging and laboratory studies on MyChart before your provider has had a chance to review them.  We understand that in some cases there may be results that are confusing or concerning to you. Not all laboratory results come back in the same time frame and the provider may be waiting for multiple results in order to interpret others.  Please give [place] 48 hours in order for your provider to thoroughly review all the results before contacting the office for clarification of  your results.   It was a pleasure to see you today!  Thank you for trusting me with your gastrointestinal care!
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
## 2023-02-24 ENCOUNTER — Encounter: Payer: Self-pay | Admitting: Interventional Cardiology

## 2023-02-24 LAB — HEPATITIS B SURFACE ANTIGEN: Hepatitis B Surface Ag: NONREACTIVE

## 2023-02-25 ENCOUNTER — Encounter: Payer: Self-pay | Admitting: Neurology

## 2023-02-25 ENCOUNTER — Telehealth: Payer: Self-pay | Admitting: Primary Care

## 2023-02-25 DIAGNOSIS — J84112 Idiopathic pulmonary fibrosis: Secondary | ICD-10-CM | POA: Insufficient documentation

## 2023-02-25 LAB — MITOCHONDRIAL ANTIBODIES: Mitochondrial M2 Ab, IgG: <=20 U (ref <=20.0)

## 2023-02-25 LAB — ALPHA-1-ANTITRYPSIN: A-1 Antitrypsin, Ser: 125 mg/dL (ref 83–199)

## 2023-02-25 NOTE — Assessment & Plan Note (Addendum)
CT scan has changed 2013/2020 -> Nov 2023 ad current CT Nov 2023 more consistent with interstitial lung disease/pulmonary fibrosis.  Based on age greater than 60, bilateral lower lobe crackles, slow progression, normal blood work findings on CT scan I think the specific variety of pulmonary fibrosis IPF otherwise call idiopathic pulmonary fibrosis.  The diagnose of IPF has also been agreed upon by the case conference.  - Spirometry today showed 6 % decline in FEV1 since January 2024, however, diffusion capacity has improved slightly  - Esbriet is currently on hold (was never started) due to elevated LFTs. Planning on seeing GI. - Advised she start using hypertonic saline twice a day followed by flutter valve for cough.  - [employer] will reach out to patient to discuss research opportunities   Follow-up: - Needs 6 weeks follow-up with Dr. [name] ([facility] clinic)

## 2023-02-25 NOTE — Telephone Encounter (Signed)
Pavero, Cristal Deer, [facility]     Unlikely to be caused by Repatha since CK levels have been elevated well before she was started on Repatha

## 2023-02-25 NOTE — Assessment & Plan Note (Signed)
-   Not tolerating CPAP due to pressure settings and mask fit. Needs CPAP titration study and establish with Dr. Wynona Neat for management

## 2023-02-25 NOTE — Telephone Encounter (Signed)
Patient unable to start Esbriet right now due to elevated LFTs. Dr. [name] is aware and wanted her to see GI before starting. She is interested in possible research options with [employer] if you can reach out to her. Thanks

## 2023-02-25 NOTE — Telephone Encounter (Signed)
Requested Prescriptions   Pending Prescriptions Disp Refills   cloBAZam (ONFI) 10 MG tablet [Pharmacy Med Name: cloBAZam 10 MG Oral Tablet] 60 tablet 0    Sig: Take 1 tablet by mouth twice daily   Last seen 08/01/22, next appt scheduled for 02/27/23 Dispenses   Dispensed Days Supply Quantity Provider Pharmacy  CLOBAZAM 10 MG TABLET 02/20/2023 30  Anson Fret, MD [facility] Mail D...  clobazam 10 mg tablet 02/20/2023 30 60 tablet Anson Fret, MD [facility] Ma...  clobazam 10 mg tablet 02/11/2023 30 60 tablet Anson Fret, MD [facility] Mail D...  CLOBAZAM 10MG        TAB 01/17/2023 30 60 each Levert Feinstein, MD [facility]...  CLOBAZAM 10MG        TAB 12/21/2022 30 60 each Levert Feinstein, MD [facility] M...  CLOBAZAM 10MG        TAB 11/12/2022 30 60 each Levert Feinstein, MD [facility] M...  CLOBAZAM 10MG        TAB 10/13/2022 30 60 each Levert Feinstein, MD [facility] M...  CLOBAZAM 10MG        TAB 09/02/2022 30 60 each Levert Feinstein, MD [facility] M...    Looks like they requesting refill to soon dispensed 30 days supply on 02/20/23 not due until 03/23/23

## 2023-02-25 NOTE — Telephone Encounter (Signed)
Pavero, Cristal Deer, RPH     Only med I see that could cause CK elevations on her list is tramadol. 1-5% of patients.
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
## 2023-02-26 NOTE — Telephone Encounter (Signed)
Received the following message from patient as an update from her GI appt:   You asked me to inform you about my visit with the [facility] doctor    He has done a series of blood work which is on my chart. It appears some levels are still elevated. I am not sure what I do now. Of course I need to lose some weight. I am waiting to hear back from Dr Glennie Isle  Will route as a FYI to [place].

## 2023-02-27 ENCOUNTER — Ambulatory Visit (INDEPENDENT_AMBULATORY_CARE_PROVIDER_SITE_OTHER): Payer: Medicare Other

## 2023-02-27 ENCOUNTER — Encounter: Payer: Self-pay | Admitting: Adult Health

## 2023-02-27 ENCOUNTER — Ambulatory Visit: Payer: Medicare Other | Admitting: Adult Health

## 2023-02-27 VITALS — BP 139/74 | HR 76 | Ht 66.0 in | Wt 225.2 lb

## 2023-02-27 DIAGNOSIS — I428 Other cardiomyopathies: Secondary | ICD-10-CM

## 2023-02-27 DIAGNOSIS — G40209 Localization-related (focal) (partial) symptomatic epilepsy and epileptic syndromes with complex partial seizures, not intractable, without status epilepticus: Secondary | ICD-10-CM

## 2023-02-27 DIAGNOSIS — I671 Cerebral aneurysm, nonruptured: Secondary | ICD-10-CM | POA: Diagnosis not present

## 2023-02-27 LAB — CUP PACEART REMOTE DEVICE CHECK
Battery Remaining Longevity: 12 mo
Battery Remaining Percentage: 14 %
Brady Statistic RV Percent Paced: 0 %
Date Time Interrogation Session: 20240710082600
HighPow Impedance: 77 Ohm
Implantable Lead Connection Status: 753985
Implantable Lead Implant Date: 20050603
Implantable Lead Location: 753860
Implantable Lead Model: 185
Implantable Lead Serial Number: 116340
Implantable Pulse Generator Implant Date: 20110505
Lead Channel Impedance Value: 623 Ohm
Lead Channel Pacing Threshold Amplitude: 0.8 V
Lead Channel Pacing Threshold Pulse Width: 0.4 ms
Lead Channel Setting Pacing Amplitude: 2.4 V
Lead Channel Setting Pacing Pulse Width: 0.4 ms
Lead Channel Setting Sensing Sensitivity: 0.4 mV
Pulse Gen Serial Number: 266301
Zone Setting Status: 755011

## 2023-02-27 NOTE — Patient Instructions (Addendum)
Your Plan:  Continue Onfi 10mg  twice daily for seizure prevention    Follow up with Maralyn Sago, NP in 6 months or call earlier if needed     Thank you for coming to see [place] at [facility]. I hope we have been able to provide you high quality care today.  You may receive a patient satisfaction survey over the next few weeks. We would appreciate your feedback and comments so that we may continue to improve ourselves and the health of our patients.

## 2023-02-27 NOTE — Telephone Encounter (Signed)
Continue to hold Esbriet

## 2023-02-28 ENCOUNTER — Telehealth: Payer: Self-pay | Admitting: *Deleted

## 2023-02-28 LAB — HEPATITIS B CORE ANTIBODY, TOTAL: Hep B Core Total Ab: NONREACTIVE

## 2023-02-28 LAB — ANTI-SMOOTH MUSCLE ANTIBODY, IGG: Actin (Smooth Muscle) Antibody (IGG): <20 U (ref <20)

## 2023-02-28 NOTE — Telephone Encounter (Signed)
   Pre-operative Risk Assessment    Patient Name: [name]  DOB: 18-Sep-1950 MRN: [id number]      Request for Surgical Clearance    Procedure:   KAHOOK GONIOTOMY LEFT EYE  Date of Surgery:  Clearance 03/12/23                                 Surgeon:  DR. Dione Booze Surgeon's Group or Practice Name:  [facility] Phone number:  [phone] Fax number:  [phone]   Type of Clearance Requested:   - Medical ; ASA    Type of Anesthesia:  MAC   Additional requests/questions:    Elpidio Anis   02/28/2023, 11:31 AM

## 2023-02-28 NOTE — Telephone Encounter (Signed)
Duplicate, will fax Tessa's note from 01/25/23.  Callback, can you please ask the office if they received the second fax?

## 2023-02-28 NOTE — Telephone Encounter (Signed)
Contacted Eye Dr Isidore Moos and confirmed fax received.

## 2023-03-04 ENCOUNTER — Telehealth: Payer: Self-pay

## 2023-03-04 MED ORDER — TIZANIDINE HCL 4 MG PO TABS: 2.0000 mg | ORAL_TABLET | Freq: Two times a day (BID) | ORAL | 1 refills | Status: DC

## 2023-03-04 NOTE — Telephone Encounter (Signed)
Patient requesting a 90 day supply to go to [facility].

## 2023-03-06 ENCOUNTER — Ambulatory Visit: Payer: Medicare Other | Admitting: Physician Assistant

## 2023-03-06 ENCOUNTER — Encounter: Payer: Self-pay | Admitting: Physician Assistant

## 2023-03-06 VITALS — BP 122/59 | HR 109 | Temp 100.5°F | Ht 66.5 in | Wt 220.0 lb

## 2023-03-06 DIAGNOSIS — J019 Acute sinusitis, unspecified: Secondary | ICD-10-CM | POA: Diagnosis not present

## 2023-03-06 DIAGNOSIS — U071 COVID-19: Secondary | ICD-10-CM | POA: Diagnosis not present

## 2023-03-06 DIAGNOSIS — B9689 Other specified bacterial agents as the cause of diseases classified elsewhere: Secondary | ICD-10-CM | POA: Diagnosis not present

## 2023-03-06 LAB — POC COVID19 BINAXNOW: SARS Coronavirus 2 Ag: POSITIVE — AB

## 2023-03-06 MED ORDER — AZITHROMYCIN 250 MG PO TABS
ORAL_TABLET | ORAL | 0 refills | Status: DC
Start: 2023-03-06 — End: 2023-03-12

## 2023-03-06 NOTE — Patient Instructions (Addendum)
Your COVID test was positive.    You are going to take azithromycin as directed.  I encourage you to stay well-hydrated and get plenty of rest.  I hope that you feel better soon.  Roney Jaffe, PA-C Physician Assistant [facility] [email or web address] COVID-19 is an infection caused by a virus called SARS-CoV-2. This type of virus is called a coronavirus. People with COVID-19 may: Have little to no symptoms. Have mild to moderate symptoms that affect their lungs and breathing. Get very sick. What are the causes? COVID-19 is caused by a virus. This virus may be in the air as droplets or on surfaces. It can spread from an infected person when they cough, sneeze, speak, sing, or breathe. You may become infected if: You breathe in the infected droplets in the air. You touch an object that has the virus on it. What increases the risk? You are at risk of getting COVID-19 if you have been around someone with the infection. You may be more likely to get very sick if: You are 81 years old or older. You have certain medical conditions, such as: Heart disease. Diabetes. Chronic respiratory disease. Cancer. Pregnancy. You are immunocompromised. This means your body cannot fight infections easily. You have a disability or trouble moving, meaning you're immobile. What are the signs or symptoms? People may have different symptoms from COVID-19. The symptoms can also be mild to severe. They often show up in 5-6 days after being infected. But they can take up to 14 days to appear. Common symptoms are: Cough. Feeling tired. New loss of taste or smell. Fever. Less common symptoms are: Sore throat. Headache. Body or muscle aches. Diarrhea. A skin rash or odd-colored fingers or toes. Red or irritated eyes. Sometimes, COVID-19 does not cause symptoms. How is this diagnosed? COVID-19 can be diagnosed with tests done in the lab or  at home. Fluid from your nose, mouth, or lungs will be used to check for the virus. How is this treated? Treatment for COVID-19 depends on how sick you are. Mild symptoms can be treated at home with rest, fluids, and over-the-counter medicines. Severe symptoms may be treated in a hospital intensive care unit (ICU). If you have symptoms and are at risk of getting very sick, you may be given a medicine that fights viruses. This medicine is called an antiviral. How is this prevented? To protect yourself from COVID-19: Know your risk factors. Get vaccinated. If your body cannot fight infections easily, talk to your provider about treatment to help prevent COVID-19. Stay at least 1 meter away from others. Wear a well-fitted mask when: You can't stay at a distance from people. You're in a place with poor air flow. Try to be in open spaces with good air flow when in public. Wash your hands often or use an alcohol-based hand sanitizer. Cover your nose and mouth when coughing and sneezing. If you think you have COVID-19 or have been around someone who has it, stay home and be by yourself for 5-10 days. Where to find more information Centers for Disease Control and Prevention (CDC): TonerPromos.no World Health Organization [facility]): VisitDestination.com.br Get help right away if: You have trouble breathing or get short of breath. You have pain or pressure in your chest. You cannot speak or move any part of your body. You are confused. Your symptoms get worse. These symptoms may be an emergency. Get help right away. Call 911. Do not wait to see if the symptoms will  go away. Do not drive yourself to the hospital. This information is not intended to replace advice given to you by your health care provider. Make sure you discuss any questions you have with your health care provider. Document Revised: 08/14/2022 Document Reviewed: 04/20/2022 Elsevier Patient Education  2024 [employer].

## 2023-03-06 NOTE — Progress Notes (Signed)
Established Patient Office Visit  Subjective   Patient ID: [name], female    DOB: November 08, 1950  Age: 72 y.o. MRN: [id number]  Chief Complaint  Patient presents with   URI    X2 days, pt is feeling fatigue    States that she started feeling very poorly approximately 2 days ago.  States that she has been having aching around her eyes, only no weakness fatigue, slight cough with yellow sputum, body aches, sore throat, pressure in both the ears, chills.  States that she is unable to take Tylenol or Aleve, states that she did take a full-strength aspirin earlier today due to the pressure around her eyes.  States it did not offer relief.  States that she has been using Flonase, Singulair, and her inhalers.  Denies sick contacts.       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   Bronchiectasis (HCC)    Cerebral aneurysm, nonruptured    had a clip put in   CHF (congestive heart failure) (HCC)    Clostridium difficile infection    Depressive disorder, not elsewhere classified    Diverticulosis of colon (without mention of hemorrhage)    Esophageal reflux    Fatty liver    Fibromyalgia    Gastritis    GERD (gastroesophageal reflux disease)    GI bleed 2004   Glaucoma    Hiatal hernia    History of COVID-19 05/04/2021   Hyperlipidemia    Hypertension    Hypothyroidism    Internal hemorrhoids    Joint pain    Multifocal pneumonia 06/03/2021   Obstructive sleep apnea (adult) (pediatric)    Osteoarthritis    Ostium secundum type atrial septal defect    Other chronic nonalcoholic liver disease    Other pulmonary embolism and infarction    Palpitations    Paroxysmal ventricular tachycardia (HCC)    Personal history of radiation therapy    PONV (postoperative nausea and vomiting)     Presence of permanent cardiac pacemaker    PUD (peptic ulcer disease)    Radiation 02/03/15-03/10/15   Right Breast   Sarcoid    per pt , not sure   Schatzki's ring    Shortness of breath    Sleep apnea    Stroke [facility]) 2013   tia/ pt feels it was around 2008 0r 2009   Takotsubo syndrome    Tubular adenoma of colon    Unspecified transient cerebral ischemia    Unspecified vitamin D deficiency    Social History   Socioeconomic History   Marital status: Divorced    Spouse name: Not on file   Number of children: 2   Years of education: 14   Highest education level: Associate degree: occupational, Scientist, product/process development, or vocational program  Occupational History   Occupation: DISABLED  Tobacco Use   Smoking status: Former    Current packs/day: 0.00    Average packs/day: 1 pack/day for 20.0 years (20.0 ttl pk-yrs)    Types: Cigarettes    Start date: 08/20/1969    Quit date: 08/20/1989    Years since quitting: 33.5    Passive exposure: Past   Smokeless tobacco: Never   Tobacco comments:    Mother, brother and sister  Vaping Use   Vaping  status: Never Used  Substance and Sexual Activity   Alcohol use: Yes    Alcohol/week: 1.0 standard drink of alcohol    Types: 1 Glasses of wine per week    Comment: occasionally, not on a weekly basis   Drug use: Not Currently    Frequency: 1.0 times per week    Comment: CBD gummy occ; marijuana occasionally   Sexual activity: Not Currently    Birth control/protection: Abstinence  Other Topics Concern   Not on file  Social History Narrative   Lives alone   Caffeine ZOX:WRUE   Retired from [employer]   2 children -- 5 grandbabies - 1 great-grandbaby    Social Determinants of Health   Financial Resource Strain: Low Risk  (01/11/2023)   Overall Financial Resource Strain (CARDIA)    Difficulty of Paying Living Expenses: Not very hard  Recent Concern: Financial Resource Strain - Medium Risk (12/25/2022)   Overall Financial Resource Strain (CARDIA)     Difficulty of Paying Living Expenses: Somewhat hard  Food Insecurity: No Food Insecurity (01/11/2023)   Hunger Vital Sign    Worried About Running Out of Food in the Last Year: Never true    Ran Out of Food in the Last Year: Never true  Transportation Needs: No Transportation Needs (01/11/2023)   PRAPARE - Administrator, Civil Service (Medical): No    Lack of Transportation (Non-Medical): No  Physical Activity: Inactive (01/11/2023)   Exercise Vital Sign    Days of Exercise per Week: 0 days    Minutes of Exercise per Session: 0 min  Stress: Stress Concern Present (12/25/2022)   [employer] of Occupational Health - Occupational Stress Questionnaire    Feeling of Stress : To some extent  Social Connections: Unknown (01/11/2023)   Social Connection and Isolation Panel [NHANES]    Frequency of Communication with Friends and Family: More than three times a week    Frequency of Social Gatherings with Friends and Family: Once a week    Attends Religious Services: Patient declined    Database administrator or Organizations: No    Attends Banker Meetings: Never    Marital Status: Divorced  Catering manager Violence: Not At Risk (12/08/2020)   Humiliation, Afraid, Rape, and Kick questionnaire    Fear of Current or Ex-Partner: No    Emotionally Abused: No    Physically Abused: No    Sexually Abused: No   Family History  Problem Relation Age of Onset   Diabetes Mother    Alzheimer's disease Mother    Hypertension Mother    Obesity Mother    Arthritis Mother    Heart disease Father    Seizures Sister    Allergies Sister    Heart disease Sister    Early death Sister    Other Brother        Thyroid problem 10/2016   Hearing loss Brother    Vision loss Brother    Heart disease Brother    Prostate cancer Brother 63       same brother as throat cancer   Throat cancer Brother        dx in his 67s; also a smoker   Arthritis Brother    Hypertension Brother     Cancer Maternal Grandmother 61       colon cancer or abdominal cancer   Lung cancer Maternal Grandfather 13   Heart disease Son        Cardiac  Arrest 07/2016   Breast cancer Maternal Aunt 55   Colon cancer Maternal Aunt 61       same sister as breast at 42   Breast cancer Maternal Aunt        dx in her 58s   Lung cancer Maternal Uncle    Pancreatic cancer Cousin 87       maternal first cousin   Breast cancer Cousin        paternal first cousin twice removed died in her 60s   Anxiety disorder Sister    Arthritis Sister    Depression Sister    Hypertension Sister    Stroke Neg Hx    Allergies  Allergen Reactions   Ace Inhibitors Swelling    Angioedema; makes tongue "break out"    Amitriptyline Hcl Other (See Comments)     makes her too sleepy!   Atorvastatin Other (See Comments)    SEVERE MYALGIA   Dilantin [Phenytoin Sodium Extended] Rash    Severe rash   Duloxetine Hcl Nausea Only and Other (See Comments)    Sleepiness/ sick  Other Reaction(s): GI Intolerance   Paroxetine Nausea Only    Rapid heartbeat   Ramipril Other (See Comments)    TONGUE ULCERS    Rosuvastatin Other (See Comments)    SEVERE MYALGIA   Carbamazepine Rash   Codeine Itching   Paxlovid [Nirmatrelvir-Ritonavir] Itching   Phenytoin Sodium Extended Rash   Simvastatin Other (See Comments)    Increase in CK, myalgias   Wellbutrin [Bupropion] Palpitations    Review of Systems  Constitutional:  Positive for chills, fever and malaise/fatigue.  HENT:  Positive for congestion, ear pain, sinus pain and sore throat. Negative for ear discharge.   Eyes: Negative.   Respiratory:  Positive for cough and sputum production. Negative for shortness of breath and wheezing.   Cardiovascular:  Negative for chest pain.  Gastrointestinal:  Negative for abdominal pain, nausea and vomiting.  Genitourinary: Negative.   Musculoskeletal:  Positive for myalgias.  Skin: Negative.   Neurological:  Positive for  headaches.  Endo/Heme/Allergies: Negative.   Psychiatric/Behavioral: Negative.        Objective:     BP (!) 122/59 (BP Location: Left Arm, Patient Position: Sitting, Cuff Size: Large)   Pulse (!) 109   Temp (!) 100.5 F (38.1 C) (Oral)   Ht 5' 6.5" (1.689 m)   Wt 220 lb (99.8 kg)   LMP  (LMP Unknown)   SpO2 95%   BMI 34.98 kg/m  BP Readings from Last 3 Encounters:  03/06/23 (!) 122/59  02/27/23 139/74  02/22/23 130/78   Wt Readings from Last 3 Encounters:  03/06/23 220 lb (99.8 kg)  02/27/23 225 lb 3.2 oz (102.2 kg)  02/22/23 229 lb (103.9 kg)      Physical Exam Vitals and nursing note reviewed.  Constitutional:      Appearance: Normal appearance.  HENT:     Head: Normocephalic.     Salivary Glands: Right salivary gland is diffusely enlarged and tender. Left salivary gland is diffusely enlarged and tender.     Right Ear: Tympanic membrane, ear canal and external ear normal.     Left Ear: Ear canal and external ear normal. Tympanic membrane is bulging.     Nose:     Right Turbinates: Enlarged and swollen.     Left Turbinates: Enlarged and swollen.     Right Sinus: Maxillary sinus tenderness and frontal sinus tenderness present.     Left Sinus:  Maxillary sinus tenderness and frontal sinus tenderness present.     Mouth/Throat:     Lips: Pink.     Mouth: Mucous membranes are moist.     Pharynx: No pharyngeal swelling or posterior oropharyngeal erythema.     Tonsils: No tonsillar exudate.  Eyes:     Extraocular Movements: Extraocular movements intact.     Conjunctiva/sclera: Conjunctivae normal.     Pupils: Pupils are equal, round, and reactive to light.  Cardiovascular:     Rate and Rhythm: Regular rhythm.     Pulses: Normal pulses.     Heart sounds: Normal heart sounds.  Pulmonary:     Effort: Pulmonary effort is normal.     Breath sounds: Normal breath sounds.  Musculoskeletal:        General: Normal range of motion.     Cervical back: Normal range of  motion and neck supple.  Skin:    General: Skin is warm and dry.  Neurological:     General: No focal deficit present.     Mental Status: She is alert and oriented to person, place, and time.  Psychiatric:        Mood and Affect: Mood normal.        Behavior: Behavior normal.        Thought Content: Thought content normal.        Judgment: Judgment normal.        Assessment & Plan:   Problem List Items Addressed This Visit   None Visit Diagnoses     Acute bacterial sinusitis    -  Primary   Relevant Medications   azithromycin (ZITHROMAX) 250 MG tablet   COVID       Relevant Medications   azithromycin (ZITHROMAX) 250 MG tablet   Other Relevant Orders   POC COVID-19 (Completed)      1. Covid Rapid COVID test positive.  Patient unable to take Paxlovid due to allergy.  Patient also is showing signs of sinus infection appropriate to treat with trial azithromycin.  Patient education given on supportive care, red flags given for prompt reevaluation. - POC COVID-19 - azithromycin (ZITHROMAX) 250 MG tablet; Take 2 tabs PO day 1, then take 1 tab PO once daily  Dispense: 6 tablet; Refill: 0   I have reviewed the patient's medical history (PMH, PSH, Social History, Family History, Medications, and allergies) , and have been updated if relevant. I spent 30 minutes reviewing chart and  face to face time with patient.    Return if symptoms worsen or fail to improve.    Kasandra Knudsen Mayers, PA-C

## 2023-03-07 ENCOUNTER — Ambulatory Visit: Payer: Medicare Other | Admitting: Mental Health

## 2023-03-08 ENCOUNTER — Other Ambulatory Visit: Payer: Self-pay | Admitting: Family Medicine

## 2023-03-08 ENCOUNTER — Encounter: Payer: Self-pay | Admitting: Internal Medicine

## 2023-03-08 ENCOUNTER — Encounter: Payer: Self-pay | Admitting: Interventional Cardiology

## 2023-03-08 DIAGNOSIS — E038 Other specified hypothyroidism: Secondary | ICD-10-CM

## 2023-03-12 ENCOUNTER — Ambulatory Visit (INDEPENDENT_AMBULATORY_CARE_PROVIDER_SITE_OTHER): Payer: Medicare Other

## 2023-03-12 ENCOUNTER — Ambulatory Visit (INDEPENDENT_AMBULATORY_CARE_PROVIDER_SITE_OTHER): Payer: Medicare Other | Admitting: Adult Health

## 2023-03-12 ENCOUNTER — Encounter: Payer: Self-pay | Admitting: Adult Health

## 2023-03-12 VITALS — BP 112/78 | HR 79 | Temp 97.9°F | Ht 66.5 in | Wt 221.2 lb

## 2023-03-12 DIAGNOSIS — J84112 Idiopathic pulmonary fibrosis: Secondary | ICD-10-CM

## 2023-03-12 DIAGNOSIS — U071 COVID-19: Secondary | ICD-10-CM

## 2023-03-12 MED ORDER — PREDNISONE 10 MG PO TABS: ORAL_TABLET | ORAL | 0 refills | Status: DC

## 2023-03-12 NOTE — Assessment & Plan Note (Signed)
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 Unable to take esbriet due to elevated LFTs -fatty liver. Will regroup with Dr. [name] next month to decide on next step . Lower dosing or OFEV if not contraindicated with cardiac hx and hepatic steatosis.,

## 2023-03-12 NOTE — Progress Notes (Signed)
@Patient  ID: [name], female    DOB: 11-10-1950, 73 y.o.   MRN: [id number]  Chief Complaint  Patient presents with   Acute Visit    Referring provider: [name], NP  HPI: 72 year old female former smoker followed for bronchiectasis, interstitial lung disease-IPF  and chronic respiratory failure and sleep apnea. Medical history significant for nonischemic cardiomyopathy  TEST/EVENTS :  PFT 04/17/12 >> FEV1 2.60 (110%), FEV1% 85, TLC 4.05 (77%), DLCO 44% PFT 01/17/17 >> FEV1 2.38 (111%), FEV1% 90, TLC 3.85 (71%), DLCO 58%, no BD RAST 05/30/17 >> dust mites PFT 10/16/18 >> FEV1 2.27 (111%), FEV1% 90, TLC 4.00 (74%), DLCO 59% PFT 06/08/22 >> FEV1 2.20 (91%), FEV1% 93, TLC 3.76 (69%), DLCO 62%   Serology:  04/02/12 >> ACE 1 10/25/15 >> ESR 40, ACE 32 05/30/17 >> IgA, IgG, IgM normal 10/16/18 >> HP panel negative, ACE 25   Chest Imaging:  CT chest 04/10/12 >> b/l lower lobe cylindrical BTX and some in upper lobes CT angio chest 10/26/15 >> patchy GGO in periphery of lungs b/l, basilar BTX, no PE; no significant change compared to 2013 HRCT chest 10/08/18 >> atherosclerosis, basilar predominant cylindrical BTX, mild centrilobular/paraseptal emphysema, air trapping CT angio chest 03/24/19 >> Chronic lung changes with peribronchial thickening, bibasilar atelectasis and chronic interstitial disease/peripheral fibrosis at the lung bases CT angio chest 06/03/21 >> scattered GGO with fibrotic changes CT angio chest 08/30/21 >> BTX with emphysema and fibrotic changes in periphery HRCT 06/2022 -Progressive ILD-dx of UIP    Sleep Tests:  PSG 11/29/08 >> AHI 9 HST 11/10/15 >> AHI 7.1, SaO2 low 75% CPAP 06/17/21 to 07/16/21 >> used on 27 of 30 nights with average 5 hrs 24 min.  Average AHI 2.5 with CPAP 5 cm H2O.   Cardiac Tests:  Echo 08/11/21 >> EF 50 to 55%, RVSP 39.5 mmHg  03/12/2023 Acute OV : Covid 19 infection  Presents for an acute office visit.  She complains of 1 week of  cough, congestion, increased shortness of breath.  She tested positive for COVID-19 on March 08, 2023.  She was treated with a Z-Pak.  She has completed this in full.  Continues to have some ongoing mucus that is thick.  Denies any fever, hemoptysis, chest pain.  She remains on Symbicort twice daily. She is feeling some better. Concerned that her mucus is still thick. Has nasal congestion and drainage. Still feels weak. Appetite is okay, no n/v/d.   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 She was started on Esbriet but unable to start due to elevated liver enzymes . Was seen by GI diagnosed with fatty liver. Has follow up next month with Dr. [name] for ILD.   Has sleep apnea on CPAP At bedtime . Does not wear oxygen at home.    Allergies  Allergen Reactions   Ace Inhibitors Swelling    Angioedema; makes tongue "break out"    Amitriptyline Hcl Other (See Comments)     makes her too sleepy!   Atorvastatin Other (See Comments)    SEVERE MYALGIA   Dilantin [Phenytoin Sodium Extended] Rash    Severe rash   Duloxetine Hcl Nausea Only and Other (See Comments)    Sleepiness/ sick  Other Reaction(s): GI Intolerance   Paroxetine Nausea Only    Rapid heartbeat   Ramipril Other (See Comments)    TONGUE ULCERS    Rosuvastatin Other (See Comments)    SEVERE MYALGIA   Carbamazepine Rash   Codeine Itching  Phenytoin Sodium Extended Rash   Simvastatin Other (See Comments)    Increase in CK, myalgias   Wellbutrin [Bupropion] Palpitations    Immunization History  Administered Date(s) Administered   Fluad Quad(high Dose 65+) 04/17/2019, 04/30/2020   H1N1 07/24/2008   Influenza Split 07/17/2011, 09/27/2011, 04/29/2012   Influenza Whole 07/20/2004, 07/04/2007, 05/07/2008, 05/19/2009, 05/17/2010   Influenza, High Dose Seasonal PF 05/30/2017, 05/21/2018   Influenza,inj,Quad PF,6+ Mos 05/20/2013, 05/06/2014, 05/17/2015, 05/15/2016, 04/27/2021   Influenza-Unspecified 05/07/2018   PFIZER(Purple  Top)SARS-COV-2 Vaccination 09/25/2019, 10/16/2019, 06/04/2020, 01/04/2021, 08/06/2021   Pfizer Covid-19 Vaccine Bivalent Booster 33yrs & up 05/07/2022   Pneumococcal Conjugate-13 11/22/2015, 08/06/2021   Pneumococcal Polysaccharide-23 05/20/2002, 05/07/2008, 06/25/2014   Respiratory Syncytial Virus Vaccine,Recomb Aduvanted(Arexvy) 06/26/2022   Td 09/07/2009   Zoster, Live 09/21/2011    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   Bronchiectasis (HCC)    Cerebral aneurysm, nonruptured    had a clip put in   CHF (congestive heart failure) (HCC)    Clostridium difficile infection    Depressive disorder, not elsewhere classified    Diverticulosis of colon (without mention of hemorrhage)    Esophageal reflux    Fatty liver    Fibromyalgia    Gastritis    GERD (gastroesophageal reflux disease)    GI bleed 2004   Glaucoma    Hiatal hernia    History of COVID-19 05/04/2021   Hyperlipidemia    Hypertension    Hypothyroidism    Internal hemorrhoids    Joint pain    Multifocal pneumonia 06/03/2021   Obstructive sleep apnea (adult) (pediatric)    Osteoarthritis    Ostium secundum type atrial septal defect    Other chronic nonalcoholic liver disease    Other pulmonary embolism and infarction    Palpitations    Paroxysmal ventricular tachycardia (HCC)    Personal history of radiation therapy    PONV (postoperative nausea and vomiting)    Presence of permanent cardiac pacemaker    PUD (peptic ulcer disease)    Radiation 02/03/15-03/10/15   Right Breast   Sarcoid    per pt , not sure   Schatzki's ring    Shortness of breath    Sleep apnea    Stroke (HCC) 2013   tia/ pt feels it was around 2008 0r 2009   Takotsubo syndrome    Tubular adenoma of colon    Unspecified transient cerebral ischemia     Unspecified vitamin D deficiency     Tobacco History: Social History   Tobacco Use  Smoking Status Former   Current packs/day: 0.00   Average packs/day: 1 pack/day for 20.0 years (20.0 ttl pk-yrs)   Types: Cigarettes   Start date: 08/20/1969   Quit date: 08/20/1989   Years since quitting: 33.5   Passive exposure: Past  Smokeless Tobacco Never  Tobacco Comments   Mother, brother and sister   Counseling given: Not Answered Tobacco comments: Mother, brother and sister   Outpatient Medications Prior to Visit  Medication Sig Dispense Refill   albuterol (PROVENTIL) (2.5 MG/3ML) 0.083% nebulizer solution USE 1 VIAL IN NEBULIZER EVERY 6 HOURS (Patient taking differently: Take 2.5 mg by nebulization every 6 (six) hours as needed for shortness of breath.) 120 mL 11   albuterol (VENTOLIN HFA) 108 (90 Base)  MCG/ACT inhaler Inhale 2 puffs into the lungs every 6 (six) hours as needed for wheezing or shortness of breath. 18 g 3   Ascorbic Acid (VITAMIN C) 1000 MG tablet Take 1,000 mg by mouth daily.     aspirin EC 81 MG tablet Take 81 mg by mouth daily. Swallow whole.     azelastine (ASTELIN) 0.1 % nasal spray Place 1 spray into both nostrils 2 (two) times daily. Use in each nostril as directed 90 mL 3   budesonide-formoterol (SYMBICORT) 80-4.5 MCG/ACT inhaler Inhale 1 puff into the lungs 2 (two) times daily. 1 each 12   cetirizine (ZYRTEC) 10 MG tablet Take 10 mg by mouth daily as needed for allergies.     Cholecalciferol (DIALYVITE VITAMIN D 5000) 125 MCG (5000 UT) capsule Take 5,000 Units by mouth daily.     cloBAZam (ONFI) 10 MG tablet Take 1 tablet (10 mg total) by mouth 2 (two) times daily. 180 tablet 1   diltiazem (CARDIZEM CD) 120 MG 24 hr capsule Take 1 capsule (120 mg total) by mouth daily. 90 capsule 3   empagliflozin (JARDIANCE) 10 MG TABS tablet Take 1 tablet (10 mg total) by mouth daily. 90 tablet 3   Evolocumab (REPATHA SURECLICK) 140 MG/ML SOAJ Inject 140 mg into the skin every 14  (fourteen) days. 6 mL 3   famotidine (PEPCID) 40 MG tablet Take 1 tablet (40 mg total) by mouth at bedtime. NEEDS APPT FOR FURTHER REFILLS 90 tablet 6   fexofenadine (ALLEGRA) 180 MG tablet Take 180 mg by mouth daily as needed for allergies (alternates with zyrtec sometimes).     fluticasone (CUTIVATE) 0.05 % cream Apply topically 2 (two) times daily. 60 g 2   fluticasone (FLONASE) 50 MCG/ACT nasal spray Place 2 sprays into both nostrils daily as needed for allergies or rhinitis. 48 g 3   FOLIC ACID PO Take 1 tablet by mouth daily.     furosemide (LASIX) 20 MG tablet TAKE 2 TABLETS (40 MG) 3 DAYS PER WEEK AND TAKE 1 TABLET (20 MG) THE OTHER DAYS OF THE WEEK (Patient taking differently: Take 20 mg by mouth daily as needed. PER PT TAKES PRN) 130 tablet 1   hydroxypropyl methylcellulose / hypromellose (ISOPTO TEARS / GONIOVISC) 2.5 % ophthalmic solution Place 1 drop into both eyes at bedtime.     ketorolac (ACULAR) 0.5 % ophthalmic solution Place 1 drop into the right eye 4 (four) times daily.     latanoprost (XALATAN) 0.005 % ophthalmic solution Place 1 drop into both eyes at bedtime.     levothyroxine (SYNTHROID) 50 MCG tablet TAKE 1 TABLET EVERY DAY 90 tablet 3   magnesium oxide (MAG-OX) 400 (240 Mg) MG tablet TAKE 1 TABLET TWICE DAILY 180 tablet 3   mometasone (ELOCON) 0.1 % lotion Apply 1 application topically daily as needed (rash). 60 mL 1   montelukast (SINGULAIR) 10 MG tablet TAKE 1 TABLET AT BEDTIME 90 tablet 3   Multiple Vitamins-Minerals (MULTIVITAMIN PO) Take 1 tablet by mouth daily.     nitroGLYCERIN (NITROSTAT) 0.4 MG SL tablet DISSOLVE ONE TABLET UNDER THE TONGUE EVERY 5 MINUTES AS NEEDED FOR CHEST PAIN.  DO NOT EXCEED A TOTAL OF 3 DOSES IN 15 MINUTES (Patient taking differently: Place 0.4 mg under the tongue every 5 (five) minutes as needed for chest pain.) 25 tablet 0   nystatin (MYCOSTATIN) 100000 UNIT/ML suspension Take 5 mLs (500,000 Units total) by mouth 4 (four) times daily. 60 mL  0   ofloxacin (  OCUFLOX) 0.3 % ophthalmic solution Place 1 drop into the right eye 4 (four) times daily.     pantoprazole (PROTONIX) 40 MG tablet TAKE 1 TABLET EVERY DAY 90 tablet 3   polyethylene glycol powder (GLYCOLAX/MIRALAX) powder Take 17 grams by mouth daily 1080 g 0   potassium chloride (KLOR-CON M) 10 MEQ tablet TAKE 1/2 TABLET EVERY DAY (CALL OFFICE TO SCHEDULE YEARLY APPT) 45 tablet 3   prednisoLONE acetate (PRED FORTE) 1 % ophthalmic suspension Place 1 drop into the right eye 4 (four) times daily.     promethazine-dextromethorphan (PROMETHAZINE-DM) 6.25-15 MG/5ML syrup Take 5 mLs by mouth 4 (four) times daily as needed for cough. 118 mL 0   Respiratory Therapy Supplies (FLUTTER) DEVI 1 Device by Does not apply route as needed. 1 each 0   sodium chloride HYPERTONIC 3 % nebulizer solution Take by nebulization 2 (two) times daily as needed for cough. 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 750 mL 12   tiZANidine (ZANAFLEX) 4 MG tablet Take 0.5-1 tablets (2-4 mg total) by mouth 2 (two) times daily. 180 tablet 1   traZODone (DESYREL) 100 MG tablet TAKE 1 TO 1 AND 1/2 TABLETS AT BEDTIME 135 tablet 3   triamcinolone cream (KENALOG) 0.1 % Apply 1 Application topically 2 (two) times daily. 453.6 g 0   acetaminophen (TYLENOL) 500 MG tablet Take 500 mg by mouth every 6 (six) hours as needed for moderate pain. (Patient not taking: Reported on 03/12/2023)     acetaminophen-codeine (TYLENOL/CODEINE #3) 300-30 MG tablet Take 1 tablet by mouth every 8 (eight) hours as needed for moderate pain. (Patient not taking: Reported on 03/12/2023) 90 tablet 3   azithromycin (ZITHROMAX) 250 MG tablet Take 2 tabs PO day 1, then take 1 tab PO once daily (Patient not taking: Reported on 03/12/2023) 6 tablet 0   cefUROXime (CEFTIN) 500 MG tablet Take 1 tablet (500 mg total) by mouth 2 (two) times daily with a meal. (Patient not taking: Reported on 03/12/2023) 14 tablet 0   Facility-Administered Medications Prior to Visit  Medication Dose  Route Frequency Provider Last Rate Last Admin   lidocaine (XYLOCAINE) 1 % (with pres) injection 1 mL  1 mL Other Once Lovorn, Megan, MD       lidocaine (XYLOCAINE) 1 % (with pres) injection 3 mL  3 mL Other Once Lovorn, Megan, MD       triamcinolone acetonide (KENALOG-40) injection 40 mg  40 mg Intramuscular Once Lovorn, Megan, MD         Review of Systems:   Constitutional:   No  weight loss, night sweats,  Fevers, chills,  +fatigue, or  lassitude.  HEENT:   No headaches,  Difficulty swallowing,  Tooth/dental problems, or  Sore throat,                No sneezing, itching, ear ache, nasal congestion, post nasal drip,   CV:  No chest pain,  Orthopnea, PND, swelling in lower extremities, anasarca, dizziness, palpitations, syncope.   GI  No heartburn, indigestion, abdominal pain, nausea, vomiting, diarrhea, change in bowel habits, loss of appetite, bloody stools.   Resp:   No chest wall deformity  Skin: no rash or lesions.  GU: no dysuria, change in color of urine, no urgency or frequency.  No flank pain, no hematuria   MS:  No joint pain or swelling.  No decreased range of motion.  No back pain.    Physical Exam  BP 112/78 (BP Location: Left Arm, Patient Position: Sitting,  Cuff Size: Large)   Pulse 79   Temp 97.9 F (36.6 C) (Oral)   Ht 5' 6.5" (1.689 m)   Wt 221 lb 3.2 oz (100.3 kg)   LMP  (LMP Unknown)   SpO2 92% Comment: up to 97% with rest  BMI 35.17 kg/m   GEN: A/Ox3; pleasant , NAD, well nourished    HEENT:  [place]/AT,  NOSE-clear, THROAT-clear, no lesions, no postnasal drip or exudate noted.   NECK:  Supple w/ fair ROM; no JVD; normal carotid impulses w/o bruits; no thyromegaly or nodules palpated; no lymphadenopathy.    RESP  BB crackles, no accessory muscle use, no dullness to percussion  CARD:  RRR, no m/r/g, no peripheral edema, pulses intact, no cyanosis or clubbing.  GI:   Soft & nt; nml bowel sounds; no organomegaly or masses detected.   Musco: Warm bil,  no deformities or joint swelling noted.   Neuro: alert, no focal deficits noted.    Skin: Warm, no lesions or rashes    Lab Results:  CBC    Component Value Date/Time   WBC 4.7 08/24/2022 1356   RBC 4.67 08/24/2022 1356   HGB 14.2 08/24/2022 1356   HGB 13.2 11/20/2021 1452   HGB 12.4 12/01/2014 0845   HCT 44.0 08/24/2022 1356   HCT 40.3 11/20/2021 1452   HCT 38.8 12/01/2014 0845   PLT 208.0 08/24/2022 1356   PLT 225 11/20/2021 1452   MCV 94.2 08/24/2022 1356   MCV 89 11/20/2021 1452   MCV 88.5 12/01/2014 0845   MCH 29.2 11/20/2021 1452   MCH 28.9 06/05/2021 0420   MCHC 32.2 08/24/2022 1356   RDW 13.1 08/24/2022 1356   RDW 12.7 11/20/2021 1452   RDW 12.7 12/01/2014 0845   LYMPHSABS 1.0 08/24/2022 1356   LYMPHSABS 1.1 11/20/2021 1452   LYMPHSABS 1.0 12/01/2014 0845   MONOABS 0.4 08/24/2022 1356   MONOABS 0.3 12/01/2014 0845   EOSABS 0.1 08/24/2022 1356   EOSABS 0.2 11/20/2021 1452   EOSABS 0.1 03/13/2007 1249   BASOSABS 0.0 08/24/2022 1356   BASOSABS 0.0 11/20/2021 1452   BASOSABS 0.0 12/01/2014 0845    BMET  Imaging: DG Chest 2 View  Result Date: 03/12/2023 CLINICAL DATA:  COVID-19, bronchiectasis, interstitial lung disease EXAM: CHEST - 2 VIEW COMPARISON:  Previous studies including chest radiograph done on 08/29/2021 and CT done on 07/06/2022 FINDINGS: Transverse diameter of heart is increased. Pacemaker/defibrillator battery is seen in the left infraclavicular region. Increased interstitial markings in the right upper and both lower lung fields have not changed. No new focal infiltrates are seen. There are no signs of alveolar pulmonary edema. There is no pleural effusion or pneumothorax. IMPRESSION: Cardiomegaly. Increased interstitial markings in right upper and both lower lung fields suggest scarring. There are no signs of alveolar pulmonary edema or new focal infiltrates. Electronically Signed   By: Ernie Avena M.D.   On: 03/12/2023 13:15   CUP  PACEART REMOTE DEVICE CHECK  Result Date: 02/27/2023 Scheduled remote reviewed. Normal device function.  9 NSVT, no therapy Next remote 91 days. LA, CVRS   Administration History     None          Latest Ref Rng & Units 02/19/2023   11:26 AM 08/24/2022    1:55 PM 06/08/2022    3:49 PM 10/16/2018   12:55 PM 01/17/2017    2:50 PM  PFT Results  FVC-Pre L 2.25  P 2.30  2.33  2.52  2.63   FVC-Predicted  Pre % 71  P 72  73  96  98   FVC-Post L   2.36  2.49  2.52   FVC-Predicted Post %   74  94  94   Pre FEV1/FVC % % 88  P 92  93  90  90   Post FEV1/FCV % %   93  91  92   FEV1-Pre L 1.97  P 2.12  2.17  2.27  2.38   FEV1-Predicted Pre % 82  P 88  90  111  113   FEV1-Post L   2.20  2.26  2.32   DLCO uncorrected ml/min/mmHg 11.85  P 11.05  12.95  12.43  15.90   DLCO UNC% % 56  P 52  62  59  58   DLCO corrected ml/min/mmHg 11.85  P 11.05  12.95   15.80   DLCO COR %Predicted % 56  P 52  62   57   DLVA Predicted % 94  P 87  93  85  80   TLC L   3.76  4.00  3.85   TLC % Predicted %   69  74  71   RV % Predicted %   55  58  55     P Preliminary result    No results found for: "NITRICOXIDE"      Assessment & Plan:   COVID-19 virus infection Resolving Covid 19 infection with superimposed bronchitis -outside window for antiviral therapy .  Chest xray today without acute process, no need for further antibiotics . Will treat with short course of steroids  Continue with mucocillary clearance regimen   Plan  Patient Instructions  Prednisone taper over next week.  Mucinex As needed  cough/congestion  Flutter valve Twice daily   Albuterol inhaler or neb As needed   Symbicort 2 puffs Twice daily  , rinse after use.  Chest xray today .  Continue on CPAP At bedtime    Fluids and rest  Follow up with Dr. [name] as planned next month and As needed   Please contact office for sooner follow up if symptoms do not improve or worsen or seek emergency care       IPF (idiopathic  pulmonary fibrosis) (HCC) Progressive ILD- UIP pattern on CT chest . Unable to take esbriet due to elevated LFTs -fatty liver. Will regroup with Dr. [name] next month to decide on next step . Lower dosing or OFEV if not contraindicated with cardiac hx and hepatic steatosis.Rubye Oaks, NP 03/12/2023

## 2023-03-12 NOTE — Assessment & Plan Note (Signed)
Resolving Covid 19 infection with superimposed bronchitis -outside window for antiviral therapy .  Chest xray today without acute process, no need for further antibiotics . Will treat with short course of steroids  Continue with mucocillary clearance regimen   Plan  Patient Instructions  Prednisone taper over next week.  Mucinex As needed  cough/congestion  Flutter valve Twice daily   Albuterol inhaler or neb As needed   Symbicort 2 puffs Twice daily  , rinse after use.  Chest xray today .  Continue on CPAP At bedtime    Fluids and rest  Follow up with Dr. [name] as planned next month and As needed   Please contact office for sooner follow up if symptoms do not improve or worsen or seek emergency care

## 2023-03-12 NOTE — Patient Instructions (Addendum)
Prednisone taper over next week.  Mucinex As needed  cough/congestion  Flutter valve Twice daily   Albuterol inhaler or neb As needed   Symbicort 2 puffs Twice daily  , rinse after use.  Chest xray today .  Continue on CPAP At bedtime    Fluids and rest  Follow up with Dr. [name] as planned next month and As needed   Please contact office for sooner follow up if symptoms do not improve or worsen or seek emergency care

## 2023-03-13 ENCOUNTER — Telehealth: Payer: Self-pay | Admitting: *Deleted

## 2023-03-13 NOTE — Telephone Encounter (Signed)
Converted from [place] message:  On Wednesday I tested positive for Covid but it started to feel bad on Monday. My PCP told me to Go to [facility]. I did and the test was positive they gave me azithromycin. Is there anything else that I should be are like prednisone. I have cough syrup from my last visit that I am not taking (promethazine) I don't want to get in trouble with my lungs

## 2023-03-13 NOTE — Telephone Encounter (Signed)
Called and spoke with patient, she states she has a cough with white to yellow mucous.  She is more sob with exertion.  Tammy Parrett NP saw this patient in the office as an acute visit and she was prescribed a prednisone taper and a CXR was done.  Is was addressed at office visit yesterday.  Nothing further needed.

## 2023-03-19 ENCOUNTER — Ambulatory Visit: Payer: Medicare Other | Admitting: Rheumatology

## 2023-03-20 NOTE — Progress Notes (Signed)
Remote ICD transmission.   

## 2023-03-22 ENCOUNTER — Encounter (HOSPITAL_BASED_OUTPATIENT_CLINIC_OR_DEPARTMENT_OTHER): Payer: Medicare Other | Admitting: Pulmonary Disease

## 2023-03-27 ENCOUNTER — Encounter (HOSPITAL_BASED_OUTPATIENT_CLINIC_OR_DEPARTMENT_OTHER): Payer: Medicare Other | Admitting: Pulmonary Disease

## 2023-03-27 ENCOUNTER — Encounter (HOSPITAL_BASED_OUTPATIENT_CLINIC_OR_DEPARTMENT_OTHER): Payer: Self-pay | Admitting: Pulmonary Disease

## 2023-03-28 ENCOUNTER — Other Ambulatory Visit: Payer: Self-pay | Admitting: Nurse Practitioner

## 2023-03-28 ENCOUNTER — Encounter: Payer: Self-pay | Admitting: Internal Medicine

## 2023-03-28 DIAGNOSIS — R058 Other specified cough: Secondary | ICD-10-CM

## 2023-03-28 NOTE — Telephone Encounter (Signed)
Pt will like a call when she is completed her sleep study for her to go over her results . Her sleep study I snot schedule until 04/01/2023

## 2023-03-29 ENCOUNTER — Ambulatory Visit: Payer: Medicare Other | Admitting: Internal Medicine

## 2023-03-29 ENCOUNTER — Encounter: Payer: Self-pay | Admitting: Internal Medicine

## 2023-03-29 ENCOUNTER — Ambulatory Visit: Payer: Medicare Other | Admitting: Pulmonary Disease

## 2023-03-29 VITALS — BP 130/80 | HR 109 | Ht 66.5 in | Wt 218.8 lb

## 2023-03-29 DIAGNOSIS — Z8616 Personal history of COVID-19: Secondary | ICD-10-CM | POA: Diagnosis not present

## 2023-03-29 DIAGNOSIS — J209 Acute bronchitis, unspecified: Secondary | ICD-10-CM

## 2023-03-29 DIAGNOSIS — J84112 Idiopathic pulmonary fibrosis: Secondary | ICD-10-CM | POA: Diagnosis not present

## 2023-03-29 DIAGNOSIS — R0609 Other forms of dyspnea: Secondary | ICD-10-CM

## 2023-03-29 DIAGNOSIS — R7989 Other specified abnormal findings of blood chemistry: Secondary | ICD-10-CM

## 2023-03-29 DIAGNOSIS — Z5912 Inadequate housing utilities: Secondary | ICD-10-CM

## 2023-03-29 LAB — HEPATIC FUNCTION PANEL
ALT: 33 U/L (ref 0–35)
AST: 28 U/L (ref 0–37)
Albumin: 4 g/dL (ref 3.5–5.2)
Alkaline Phosphatase: 125 U/L — ABNORMAL HIGH (ref 39–117)
Bilirubin, Direct: 0.1 mg/dL (ref 0.0–0.3)
Total Bilirubin: 0.6 mg/dL (ref 0.2–1.2)
Total Protein: 7.5 g/dL (ref 6.0–8.3)

## 2023-03-29 LAB — CBC WITH DIFFERENTIAL/PLATELET
Basophils Absolute: 0.1 10*3/uL (ref 0.0–0.1)
Basophils Relative: 0.8 % (ref 0.0–3.0)
Eosinophils Absolute: 0.1 10*3/uL (ref 0.0–0.7)
Eosinophils Relative: 1.8 % (ref 0.0–5.0)
HCT: 43.8 % (ref 36.0–46.0)
Hemoglobin: 14.2 g/dL (ref 12.0–15.0)
Lymphocytes Relative: 8.3 % — ABNORMAL LOW (ref 12.0–46.0)
Lymphs Abs: 0.7 10*3/uL (ref 0.7–4.0)
MCHC: 32.3 g/dL (ref 30.0–36.0)
MCV: 93.5 fl (ref 78.0–100.0)
Monocytes Absolute: 0.8 10*3/uL (ref 0.1–1.0)
Monocytes Relative: 10 % (ref 3.0–12.0)
Neutro Abs: 6.4 10*3/uL (ref 1.4–7.7)
Neutrophils Relative %: 79.1 % — ABNORMAL HIGH (ref 43.0–77.0)
Platelets: 190 10*3/uL (ref 150.0–400.0)
RBC: 4.69 Mil/uL (ref 3.87–5.11)
RDW: 14 % (ref 11.5–15.5)
WBC: 8.1 10*3/uL (ref 4.0–10.5)

## 2023-03-29 LAB — BASIC METABOLIC PANEL
BUN: 10 mg/dL (ref 6–23)
CO2: 22 mEq/L (ref 19–32)
Calcium: 9.9 mg/dL (ref 8.4–10.5)
Chloride: 103 mEq/L (ref 96–112)
Creatinine, Ser: 0.84 mg/dL (ref 0.40–1.20)
GFR: 68.42 mL/min (ref >60.00)
Glucose, Bld: 103 mg/dL — ABNORMAL HIGH (ref 70–99)
Potassium: 4 mEq/L (ref 3.5–5.1)
Sodium: 134 mEq/L — ABNORMAL LOW (ref 135–145)

## 2023-03-29 LAB — D-DIMER, QUANTITATIVE: D-Dimer, Quant: 5.59 mcg/mL FEU — ABNORMAL HIGH (ref <0.50)

## 2023-03-29 LAB — BRAIN NATRIURETIC PEPTIDE: Pro B Natriuretic peptide (BNP): 84 pg/mL (ref 0.0–100.0)

## 2023-03-29 MED ORDER — PREDNISONE 10 MG PO TABS: ORAL_TABLET | ORAL | 0 refills | Status: DC

## 2023-03-29 MED ORDER — DOXYCYCLINE HYCLATE 100 MG PO TABS: 100.0000 mg | ORAL_TABLET | Freq: Two times a day (BID) | ORAL | 0 refills | Status: DC

## 2023-03-29 NOTE — Patient Instructions (Addendum)
ICD-10-CM   1. IPF (idiopathic pulmonary fibrosis) (HCC)  J84.112     2. Acute bronchitis, unspecified organism  J20.9     3. History of 2019 novel coronavirus disease (COVID-19)  Z86.16     4. Abnormal LFTs  R79.89     5. Has no electricity in home  Z59.12       Not fully sure what is going on other than sounds like acute bronchitis. You are now at risk for getting pulmonary fibrosis flareup or developing other complications  Plan - Do blood work CBC with differential, D-dimer, troponin, BNP chemistry and liver function test  = stat  = Results might not come through the weekend and if you are getting worse to go to the emergency room -Call [employer] and try to restart following your house  -Look at asking for a form where they can put in a priority list because of your oxygen needs -Use oxygen with simple exertion and at night - Take prednisone 40 mg daily x 2 days, then 20mg  daily x 2 days, then 10mg  daily x 2 days, then 5mg  daily x 2 days and stop - Take doxycycline 100mg  po twice daily x 5 days; take after meals and avoid sunlight   Followup  - Keep previously scheduled follow-up with me on 04/11/2023  -Symptom score and sit/stand test at that time.

## 2023-03-29 NOTE — Progress Notes (Signed)
OV 08/23/2022 -refer by Dr. Craige Cotta to Dr. [name] at the pulmonary fibrosis center.  Subjective:  Patient ID: [name], female , DOB: 07/11/1951 , age 73 y.o. , MRN: [id number] , ADDRESS: [address] [place] [address] PCP Georganna Skeans, MD Patient Care Team: Georganna Skeans, MD as PCP - General (Family Medicine) Duke Salvia, MD as PCP - Electrophysiology (Cardiology) Corky Crafts, MD as PCP - Cardiology (Cardiology) Griselda Miner, MD as Consulting Physician (General Surgery) Malachy Mood, MD as Consulting Physician (Hematology) Lurline Hare, MD as Consulting Physician (Radiation Oncology) Donnelly Angelica, RN as Registered Nurse Pershing Proud, RN as Registered Nurse Duke Salvia, MD as Consulting Physician (Cardiology) Hubbard Hartshorn, NP (Inactive) as Nurse Practitioner (Nurse Practitioner) [name], NP as Nurse Practitioner (Nurse Practitioner) Coralyn Helling, MD as Consulting Physician (Pulmonary Disease) Griselda Miner, MD as Consulting Physician (General Surgery) Pollyann Savoy, MD as Consulting Physician (Rheumatology)  This Provider for this visit: Treatment Team:  Attending Provider: Kalman Shan, MD    08/23/2022 -   Chief Complaint  Patient presents with   Consult    Referred for ILD, states DOE     HPI [facility] 72 y.o. -referred by Dr. Craige Cotta to Dr. [name] the fibrosis center.  History is gained by review of the chart, talking to the patient in the ILD questionnaire that the patient fell.  There is no one accompanying the patient.  She has been following with Dr. Craige Cotta at least for the last 3 to 4 years for a diagnosis of bronchiectasis.  She says at baseline she is doing fairly well but then in October 2022 she suffered from COVID-19 and then was hospitalized.  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   Then a few weeks later on 06/03/2021 she was hospitalized for worsening respiratory failure which she believes was because of COVID-19 but review the records indicate that she actually had COVID in September 2022 and was treated with Paxlovid as an outpatient.  At this hospitalization the COVID was negative.  She was actually hypoxemic.  Pulmonary embolism was ruled out.  This admission RVP and COVID were normal.  Procalcitonin was normal.  Final diagnosis was acute on chronic hypoxemic respiratory failure due to bronchiectasis and pulmonary fibrosis.  She says since then she has been gradually weaned of oxygen.  She says overall she is better since the hospitalization October 2022 but she is not back to her baseline from before the hospitalization.  She has dyspnea on exertion.  Most recently November 2023 Dr. Craige Cotta did high-resolution CT scan of the chest that is concerning for honeycombing findings and progressive changes [personally visualized and I do not fully agree].  Therefore she has been referred here.  Pulmonary function testing shows still blood in DLCO but the FVC seems reduced recently.   All the question is as follows  Most recently she is use 2 L of oxygen with exertion.   [facility] Comprehensive ILD Questionnaire  Symptoms:  Other than dyspnea she also has a cough.  The cough is a since December 2023.  Although currently it is not there.   Past Medical History :  -She marked positive for COPD according to history but does not not be correct. -She is have OSA for which she is previous Dr. Craige Cotta - She does have a  longstanding history of acid reflux/hiatal hernia No history of tuberculosis -She has a history of breast cancer with radiation to the right breast in 2018 -Per the records she suffers from chronic pain syndrome with a history of right brain aneurysm clipping 2001 and cervical stenosis. -She has acid reflux disease for which she sees Dr. Rhea Belton -She has  overactive bladder -She has nonischemic cardiomyopathy for which she sees Dr. Graciela Husbands  -EF 35-40% in 2013 but as high as 50-55% in December 2022   ROS:  She has longstanding chronic pain and gait abnormality  FAMILY HISTORY of LUNG DISEASE:  She has arthralgia - She does have dry eyes -She does have heartburn -Denies family history of lung disease  PERSONAL EXPOSURE HISTORY:  -Denies family history of lung disease -She smoked between 1966 in 71 1/2 pack/day.  No marijuana no cocaine no vaping  HOME  EXPOSURE and HOBBY DETAILS :  -Her current home was built in 1973 and she has been in this house since the last 25 years.  She does use a CPAP mask.  She does use a nebulizer machine.  She does do some occasional gardening but otherwise detailed organic antigen history is negative.  OCCUPATIONAL HISTORY (122 questions) : She is to work for [employer] and then got disabled and then retired.  Detail organic and inorganic antigen history exposure at work is negative.  PULMONARY TOXICITY HISTORY (27 items):  -Did get rid Dacian to the right breast in 2018  INVESTIGATIONS: -     CT Chest data - HRCT Nov 2023 -personally visualized.  Not fully sure if that is actually UIP or not.  If that is UIP then that negative prognostic marker.  I am also not sure about progression.  With this allowed to be clarified with multidisciplinary case conference.   Narrative & Impression  CLINICAL DATA:  72 year old female with history of shortness of breath. Evaluate for interstitial lung disease.   EXAM: CT CHEST WITHOUT CONTRAST   TECHNIQUE: Multidetector CT imaging of the chest was performed following the standard protocol without intravenous contrast. High resolution imaging of the lungs, as well as inspiratory and expiratory imaging, was performed.   RADIATION DOSE REDUCTION: This exam was performed according to the departmental dose-optimization program which includes automated exposure  control, adjustment of the mA and/or kV according to patient size and/or use of iterative reconstruction technique.   COMPARISON:  CTA of the chest 08/30/2021.   FINDINGS: Cardiovascular: Heart size is normal. There is no significant pericardial fluid, thickening or pericardial calcification. There is aortic atherosclerosis, as well as atherosclerosis of the great vessels of the mediastinum and the coronary arteries, including calcified atherosclerotic plaque in the left anterior descending coronary artery. Left-sided pacemaker/AICD device in place with lead tip terminating in the right ventricular apex.   Mediastinum/Nodes: No pathologically enlarged mediastinal or hilar lymph nodes. Please note that accurate exclusion of hilar adenopathy is limited on noncontrast CT scans. Esophagus is unremarkable in appearance. No axillary lymphadenopathy.   Lungs/Pleura: High-resolution images demonstrate widespread but patchy areas of ground-glass attenuation, septal thickening, subpleural reticulation, parenchymal banding, traction bronchiectasis, peripheral bronchiolectasis and extensive honeycombing. These findings have a definitive craniocaudal gradient and are progressive compared to the prior examination from 08/30/2021. Inspiratory and expiratory imaging is unremarkable. No acute confluent consolidative airspace disease. No pleural effusions. No definite suspicious appearing pulmonary nodules or masses are noted.   Upper Abdomen: Aortic atherosclerosis. Colonic diverticulosis noted in the splenic flexure of the colon.  Musculoskeletal: There are no aggressive appearing lytic or blastic lesions noted in the visualized portions of the skeleton.   IMPRESSION: 1. 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 2. Aortic atherosclerosis, in addition to left  anterior descending coronary artery disease. Assessment for potential risk factor modification, dietary therapy or pharmacologic therapy may be warranted, if clinically indicated. 3. Colonic diverticulosis without evidence of acute diverticulitis at this time.   Aortic Atherosclerosis (ICD10-I70.0).     Electronically Signed   By: Trudie Reed M.D.   On: 07/08/2022 10:54     OV 11/22/2022  Subjective:  Patient ID: [name], female , DOB: [birth year] , age 33 y.o. , MRN: [id number] , ADDRESS: [address] [place] [address] PCP Georganna Skeans, MD Patient Care Team: Georganna Skeans, MD as PCP - General (Family Medicine) Duke Salvia, MD as PCP - Electrophysiology (Cardiology) Corky Crafts, MD as PCP - Cardiology (Cardiology) Griselda Miner, MD as Consulting Physician (General Surgery) Malachy Mood, MD as Consulting Physician (Hematology) Lurline Hare, MD as Consulting Physician (Radiation Oncology) Donnelly Angelica, RN as Registered Nurse Pershing Proud, RN as Registered Nurse Duke Salvia, MD as Consulting Physician (Cardiology) Hubbard Hartshorn, NP (Inactive) as Nurse Practitioner (Nurse Practitioner) [name], NP as Nurse Practitioner (Nurse Practitioner) Coralyn Helling, MD as Consulting Physician (Pulmonary Disease) Griselda Miner, MD as Consulting Physician (General Surgery) Pollyann Savoy, MD as Consulting Physician (Rheumatology)  This Provider for this visit: Treatment Team:  Attending Provider: Kalman Shan, MD    11/22/2022 -   Chief Complaint  Patient presents with   Follow-up    F/u pft 11/15/22. Labs,      HPI Charlane Hoglen 72 y.o. -returns for follow-up.  This is potential ILD workup in progress.  She states in the interim her son who is age 3 did have a heart transplant for congestive heart failure 3 weeks ago at [facility] he is home and is improving.  She is happy about that.  She  also reports that after her COVID she was prescribed Symbicort she is run out of the Symbicort for the last 2 weeks but this is not really bothering her.  She feels stable she has occasional cough.  Be here to review the results.  She had pulmonary function test that shows now a consistent pattern of decline her FVC is declined 9% in the last 4 years.  I went back and visualized all her CT scans of the chest.  Her CT scan in 2013 and a high-resolution CT chest in 2020 look about similar with some bronchiectasis.  However there is dramatic change between 2020 and also the high-resolution CT chest and late 2023.  This seems a lot more traction bronchiectasis.  There is definite craniocaudal gradient.  She has crackles here.  Initially I was not sure if this was classic UIP but clinically this makes sense especially of the lower lobe crackles.  Was supposed to discuss this in the case conference but this did not happen.  Every posted for discussion of the case conference but nevertheless his decline in PFTs and also this change now in the CT scan of the chest.  In the interim his serologies are normal.  I think at the least she has some plan for progressive ILD .  Possible is IPF but I would like to confirm this and discussion  of the case conference.  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  Went over the 2 antifibrotic's nintedanib and pirfenidone.  Shows pirfenidone because of the diarrhea side effects with nintedanib.  She has knee pain and be hard to get to the toilet.  In addition she has nonischemic cardiomyopathy and this blackbox warning for MI risk with with nintedanib.  [Even though she does not have ischemic cardiomyopathy].  Told her that pirfenidone associated more with weight loss nausea anorexia.  Both drugs required liver function monitoring.  Generally both drugs have reversible side effects.  In balance which was  pirfenidone.  I did note mild transaminitis in the past will check liver function test again today.   Xxxxxxxxx[id number]      OV 01/03/2023  Subjective:  Patient ID: [name], female , DOB: December 04, 1950 , age 39 y.o. , MRN: [id number] , ADDRESS: [address] [place] [address] PCP Georganna Skeans, MD Patient Care Team: Georganna Skeans, MD as PCP - General (Family Medicine) Duke Salvia, MD as PCP - Electrophysiology (Cardiology) Corky Crafts, MD as PCP - Cardiology (Cardiology) Griselda Miner, MD as Consulting Physician (General Surgery) Malachy Mood, MD as Consulting Physician (Hematology) Lurline Hare, MD as Consulting Physician (Radiation Oncology) Donnelly Angelica, RN as Registered Nurse Pershing Proud, RN as Registered Nurse Duke Salvia, MD as Consulting Physician (Cardiology) Hubbard Hartshorn, NP (Inactive) as Nurse Practitioner (Nurse Practitioner) [name], NP as Nurse Practitioner (Nurse Practitioner) Coralyn Helling, MD as Consulting Physician (Pulmonary Disease) Griselda Miner, MD as Consulting Physician (General Surgery) Pollyann Savoy, MD as Consulting Physician (Rheumatology)  This Provider for this visit: Treatment Team:  Attending Provider: Kalman Shan, MD    01/03/2023 -   Chief Complaint  Patient presents with   Follow-up    F/up wheezing, congestion, cough, sob with exertion.     HPI Dharma Mccone 72 y.o. -returns for follow-up.  At this visit she was supposed to have started pirfenidone but what happened is that she had abnormal liver enzymes at the time of last visit.  We found that she was taking a lot of Tylenol for the knee pain.  She is now saying she did not take a lot but she actually did.  Her liver enzymes have since normalized as of December 07, 2022.  She is concerned about a high total protein.  She wanted to discuss this.  I did recommend to her that she  discuss this with primary care physician.  Then she brought her back pain.  I also suggested she discuss with primary care physician.  She wants repeat liver enzymes check especially because of total protein which she believes is because of the Repatha.  Meanwhile she continues to be symptomatic.  We discussed in the case conference in the results are below.  I did share these results with the patient.     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 NOv 2023: most rcent versus Feb 2020 HRCT. In msost recent Ct  basilar predmon, ILD, Subpleural reticuation with prominent TB. There is clear honeycombing (moderate) in lung base. No air trapping. Progressed sinc2020.  C/w UIP. In 2020: read as alternate diagnosis but Dr Ashley Murrain thinks in retrospect is mild to moderate  probable or c/w UIP with mild honeycombing. The bronchiectasis all traction bronchiectasis  of fibrosis does not need bipsy. Call it IPF. Rx as IPF   We then discussed the antifibrotic's again.  We went over nintedanib or pirfenidone again.  We stuck with the original decision on taking pirfenidone and she is committing to it.  I will send a note to pharmacy.  Will check liver function test today.  03/12/2023 Acute OV : Covid 19 infection  Presents for an acute office visit.  She complains of 1 week of cough, congestion, increased shortness of breath.  She tested positive for COVID-19 on March 08, 2023.  She was treated with a Z-Pak.  She has completed this in full.  Continues to have some ongoing mucus that is thick.  Denies any fever, hemoptysis, chest pain.  She remains on Symbicort twice daily. She is feeling some better. Concerned that her mucus is still thick. Has nasal congestion and drainage. Still feels weak. Appetite is okay, no n/v/d.   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 She was started on Esbriet but unable to start due to elevated liver enzymes . Was  seen by GI diagnosed with fatty liver. Has follow up next month with Dr. [name] for ILD.   Has sleep apnea on CPAP At bedtime . Does not wear oxygen at home.        OV 03/29/2023  Subjective:  Patient ID: [name], female , DOB: Jan 17, 1951 , age 59 y.o. , MRN: [id number] , ADDRESS: [address] [place] [address] PCP [name], NP Patient Care Team: [name], NP as PCP - General (Nurse Practitioner) Duke Salvia, MD as PCP - Electrophysiology (Cardiology) Corky Crafts, MD as PCP - Cardiology (Cardiology) Griselda Miner, MD as Consulting Physician (General Surgery) Malachy Mood, MD as Consulting Physician (Hematology) Lurline Hare, MD as Consulting Physician (Radiation Oncology) Donnelly Angelica, RN as Registered Nurse Pershing Proud, RN as Registered Nurse Duke Salvia, MD as Consulting Physician (Cardiology) Hubbard Hartshorn, NP (Inactive) as Nurse Practitioner (Nurse Practitioner) [name], NP as Nurse Practitioner (Nurse Practitioner) Coralyn Helling, MD as Consulting Physician (Pulmonary Disease) Griselda Miner, MD as Consulting Physician (General Surgery) Pollyann Savoy, MD as Consulting Physician (Rheumatology)  This Provider for this visit: Treatment Team:  Attending Provider: Kalman Shan, MD    03/29/2023 -   Chief Complaint  Patient presents with   Acute Visit    Cough but not productive, runny nose.     HPI Cambelle Gerena 72 y.o. -acute visit for this IPF patient who has not been able to start antifibrotic's because of elevated liver enzymes.  Since her last visit she did see Dr. Bess Kinds who could not find an identifiable etiology for the elevated liver enzymes advised a low carbohydrate diet which she has been following.  She comes in because she is acutely ill.  She tells me that she did see Rikki Spearing March 12, 2023 Milinda Pointer reviewed] had acute COVID-19..  Now she is calling  acutely for cough.  She says cough is getting worse for the last 2 days.  This headaches are some back pressure there is also shortness of breath for 2 days she feels some chills but no fever no sputum production.  Therefore she is here.  She does use oxygen at night.  In the past have tried to do excess hypoxemia test but unable to because of her knee issue.  Today she was able to do and she did desaturate to 81%  on a sit/stand test after doing it 10 times.  But I do not have a baseline to compare to.   SYMPTOM SCALE - ILD 08/23/2022 11/22/2022   Current weight    O2 use x ra  Shortness of Breath 0 -> 5 scale with 5 being worst (score 6 If unable to do)   At rest 0 2  Simple tasks - showers, clothes change, eating, shaving 2 3  Household (dishes, doing bed, laundry) 1 4  Shopping 1 3  Walking level at own pace 2 3  Walking up Stairs 3 5  Total (30-36) Dyspnea Score 9 20      Non-dyspnea symptoms (0-> 5 scale) 08/23/2022 01/03/2023   How bad is your cough? 0 3  How bad is your fatigue 0 2  How bad is nausea 00 0  How bad is vomiting?  0 0  How bad is diarrhea? 0 0  How bad is anxiety? 2 4  How bad is depression 2 5  Any chronic pain - if so where and how bad 1 x do spirometry and   Simple office walk 185 feet x  3 laps goal with forehead probe 11/22/2022 Could not do due to Rt Knee pain 03/29/2023   O2 used x ra  Number laps completed x Sit stna x 10  Comments about pace x   Resting Pulse Ox/HR x% and x/min 92% and HR 107  Final Pulse Ox/HR x% and x/min 81% and HR 121  Desaturated </= 88% x yes  Desaturated <= 3% points x yes  Got Tachycardic >/= 90/min x es  Symptoms at end of test x Level 6 dyspnea  Miscellaneous comments xx     TEST/EVENTS :  PFT 04/17/12 >> FEV1 2.60 (110%), FEV1% 85, TLC 4.05 (77%), DLCO 44% PFT 01/17/17 >> FEV1 2.38 (111%), FEV1% 90, TLC 3.85 (71%), DLCO 58%, no BD RAST 05/30/17 >> dust mites PFT 10/16/18 >> FEV1 2.27 (111%), FEV1% 90, TLC 4.00 (74%), DLCO  59% PFT 06/08/22 >> FEV1 2.20 (91%), FEV1% 93, TLC 3.76 (69%), DLCO 62%   Serology:  04/02/12 >> ACE 1 10/25/15 >> ESR 40, ACE 32 05/30/17 >> IgA, IgG, IgM normal 10/16/18 >> HP panel negative, ACE 25   Chest Imaging:  CT chest 04/10/12 >> b/l lower lobe cylindrical BTX and some in upper lobes CT angio chest 10/26/15 >> patchy GGO in periphery of lungs b/l, basilar BTX, no PE; no significant change compared to 2013 HRCT chest 10/08/18 >> atherosclerosis, basilar predominant cylindrical BTX, mild centrilobular/paraseptal emphysema, air trapping CT angio chest 03/24/19 >> Chronic lung changes with peribronchial thickening, bibasilar atelectasis and chronic interstitial disease/peripheral fibrosis at the lung bases CT angio chest 06/03/21 >> scattered GGO with fibrotic changes CT angio chest 08/30/21 >> BTX with emphysema and fibrotic changes in periphery HRCT 06/2022 -Progressive ILD-dx of UIP    Sleep Tests:  PSG 11/29/08 >> AHI 9 HST 11/10/15 >> AHI 7.1, SaO2 low 75% CPAP 06/17/21 to 07/16/21 >> used on 27 of 30 nights with average 5 hrs 24 min.  Average AHI 2.5 with CPAP 5 cm H2O.   Cardiac Tests:  Echo 08/11/21 >> EF 50 to 55%, RVSP 39.5 mmHg  PFT     Latest Ref Rng & Units 02/19/2023   11:26 AM 08/24/2022    1:55 PM 06/08/2022    3:49 PM 10/16/2018   12:55 PM 01/17/2017    2:50 PM  ILD indicators  FVC-Pre L 2.25  2.30  2.33  2.52  2.63   FVC-Predicted Pre % 71  72  73  96  98   FVC-Post L   2.36  2.49  2.52   FVC-Predicted Post %   74  94  94   TLC L   3.76  4.00  3.85   TLC Predicted %   69  74  71   DLCO uncorrected ml/min/mmHg 11.85  11.05  12.95  12.43  15.90   DLCO UNC %Pred % 56  52  62  59  58   DLCO Corrected ml/min/mmHg 11.85  11.05  12.95   15.80   DLCO COR %Pred % 56  52  62   57      Latest Reference Range & Units 03/27/06 13:45 09/03/06 12:51 07/04/07 09:36 10/09/07 09:26 02/16/08 14:34 08/17/08 10:44 03/04/09 09:27 08/24/09 09:34 01/03/12 11:20 06/18/13 10:01 06/18/14  09:00 12/01/14 08:47 12/29/14 13:00 10/25/15 11:09 11/15/15 08:34 02/03/16 14:15 11/26/16 10:29 02/22/17 08:52 02/27/17 15:18 11/27/17 10:43 01/30/18 15:50 12/04/18 12:40 03/17/19 15:55 05/05/19 11:13 10/01/19 14:08 10/29/19 12:19 12/07/19 10:53 02/15/21 14:29 04/10/21 12:13 04/14/21 10:58 04/26/21 12:41 06/04/21 03:32 06/05/21 04:20 10/19/21 09:11 07/17/22 15:05 08/24/22 13:56 11/22/22 16:04 12/07/22 10:39 01/03/23 14:55 01/17/23 15:02 02/22/23 14:53  AST 0 - 37 U/L 34 37 37 36 31 35 32 31 32 33  29 29 33 27 30 33 25 33 36 (H) 37 36 33 42 (H) 34 69 (H) 44 (H) 37 50 (H) 42 (H) 35 35 26 25 25  40 (H) 39 (H) 111 (H) 30 41 (H) 43 (H) 39 (H)  ALT 0 - 35 U/L 34 38 38 (H) 37 (H) 28 28 30 30  32 25 25 33 29 22 27 29 27 35  39 (H) 42 (H) 39 (H) 38 (H) 41 (H) 42 (H) 63 (H) 58 (H) 41 (H) 47 (H) 40 (H) 37 (H) 34 20 19 21  47 (H) 41 (H) 138 (H) 30 38 (H) 49 (H) 54 (H)  Total Protein 6.0 - 8.3 g/dL 7.6 6.9   7.6 7.3   7.3 8.0 7.4 7.5 7.3 7.9 7.4 7.5 7.4 7.5 7.6 7.5 7.9 7.0 7.2 6.9 7.1 7.3 6.8 7.1 7.0 7.0 7.0 6.3 (L) 6.5 6.6 7.4 7.0 7.1 7.3 7.3 7.8 7.3  Bilirubin, Direct 0.0 - 0.3 mg/dL  0.1   0.1 0.1                  0.0      0.1      0.1 0.1 0.1 0.1 0.0 0.1  Total Bilirubin 0.2 - 1.2 mg/dL 0.5 0.8   0.7 0.5   0.3 0.6 0.5 0.42 0.5 0.4 0.4 0.6 0.4 0.4 0.5 0.4 0.4 0.3 0.3 0.4 0.3 0.4 0.5 0.4 0.4 0.5 0.7 0.8 0.6 0.3 0.5 0.4 0.4 0.3 0.3 0.3 0.4  (H): Data is abnormally high (L): Data is abnormally low  LAB RESULTS last 96 hours No results found.  LAB RESULTS last 90 days Recent Results (from the past 2160 hour(s))  Hepatic function panel     Status: Abnormal   Collection Time: 01/03/23  2:55 PM  Result Value Ref Range   Total Bilirubin 0.3 0.2 - 1.2 mg/dL   Bilirubin, Direct 0.1 0.0 - 0.3 mg/dL   Alkaline Phosphatase 125 (H) 39 - 117 U/L   AST 41 (H) 0 - 37 U/L   ALT 38 (H) 0 - 35 U/L   Total Protein 7.3 6.0 - 8.3 g/dL   Albumin 4.0 3.5 -  5.2 g/dL  Hepatic function panel     Status: Abnormal   Collection Time:  01/17/23  3:02 PM  Result Value Ref Range   Total Bilirubin 0.3 0.2 - 1.2 mg/dL   Bilirubin, Direct 0.0 0.0 - 0.3 mg/dL   Alkaline Phosphatase 137 (H) 39 - 117 U/L   AST 43 (H) 0 - 37 U/L   ALT 49 (H) 0 - 35 U/L   Total Protein 7.8 6.0 - 8.3 g/dL   Albumin 4.3 3.5 - 5.2 g/dL  Pulmonary function test     Status: None   Collection Time: 02/19/23 11:26 AM  Result Value Ref Range   FVC-Pre 2.25 L   FVC-%Pred-Pre 71 %   FEV1-Pre 1.97 L   FEV1-%Pred-Pre 82 %   FEV6-Pre 2.25 L   FEV6-%Pred-Pre 74 %   Pre FEV1/FVC ratio 88 %   FEV1FVC-%Pred-Pre 116 %   Pre FEV6/FVC Ratio 100 %   FEV6FVC-%Pred-Pre 104 %   FEF 25-75 Pre 1.91 L/sec   FEF2575-%Pred-Pre 103 %   DLCO unc 11.85 ml/min/mmHg   DLCO unc % pred 56 %   DLCO cor 11.85 ml/min/mmHg   DLCO cor % pred 56 %   DL/VA 0.34 ml/min/mmHg/L   DL/VA % pred 94 %  Hepatic function panel     Status: Abnormal   Collection Time: 02/22/23  2:53 PM  Result Value Ref Range   Total Bilirubin 0.4 0.2 - 1.2 mg/dL   Bilirubin, Direct 0.1 0.0 - 0.3 mg/dL   Alkaline Phosphatase 113 39 - 117 U/L   AST 39 (H) 0 - 37 U/L   ALT 54 (H) 0 - 35 U/L   Total Protein 7.3 6.0 - 8.3 g/dL   Albumin 4.1 3.5 - 5.2 g/dL  [id number]     Status: None   Collection Time: 02/22/23  2:53 PM  Result Value Ref Range   A-1 Antitrypsin, Ser 125 83 - 199 mg/dL  CK     Status: Abnormal   Collection Time: 02/22/23  2:53 PM  Result Value Ref Range   Total CK 215 (H) 7 - 177 U/L  Mitochondrial antibodies     Status: None   Collection Time: 02/22/23  2:53 PM  Result Value Ref Range   Mitochondrial M2 Ab, IgG <=20.0 <=20.0 U    Comment: . Reference Range:    Negative:  <=20.0      U    Equivocal: 20.1 - 24.9 U    Positive:  >=25.0      U .   Anti-smooth muscle antibody, IgG     Status: None   Collection Time: 02/22/23  2:53 PM  Result Value Ref Range   Actin (Smooth Muscle) Antibody (IGG) <20 <20 U    Comment: . Reference Range:    <20 U: Negative >or=20  U: Positive . [name] Antibodies recognizing actin are the main component of smooth muscle antibodies associated with auto- immune liver disease. Actin antibodies are found in approximately 75% of patients with autoimmune hepatitis (AIH) type 1, approximately 65% of patients with autoimmune cholangitis, approximately 30% of patients with primary biliary cirrhosis and approximately 2% of healthy controls. High values are closely correlated with AIH type 1. .   Hepatitis B core antibody, total     Status: None   Collection Time: 02/22/23  2:53 PM  Result Value Ref Range   Hep B Core Total Ab NON-REACTIVE NON-REACTIVE    Comment: . For additional information, please refer to  http://education.[facility].com/faq/FAQ202  (  This link is being provided for informational/ educational purposes only.) .   Hepatitis B surface antigen     Status: None   Collection Time: 02/22/23  2:53 PM  Result Value Ref Range   Hepatitis B Surface Ag NON-REACTIVE NON-REACTIVE    Comment: . For additional information, please refer to  http://education.[facility].com/faq/FAQ202  (This link is being provided for informational/ educational purposes only.) .   CUP PACEART REMOTE DEVICE CHECK     Status: None   Collection Time: 02/27/23  8:26 AM  Result Value Ref Range   Date Time Interrogation Session [id number]    Pulse Generator Manufacturer BOST    Pulse Gen Model E102 TELIGEN 100    Pulse Gen Serial Number [id number]    Clinic Name [facility]    Implantable Pulse Generator Type Implantable Cardiac Defibulator    Implantable Pulse Generator Implant Date [id number]    Implantable Lead Manufacturer GUIC    Implantable Lead Model 0185 Endotak Reliance G    Implantable Lead Serial Number [id number]    Implantable Lead Implant Date [id number]    Implantable Lead Location Detail 1 APEX    Implantable Lead Location [id number]    Implantable Lead Connection Status [id number]    Lead Channel Setting Sensing  Sensitivity 0.4 mV   Lead Channel Setting Sensing Adaptation Mode Adaptive Sensing    Lead Channel Setting Pacing Pulse Width 0.4 ms   Lead Channel Setting Pacing Amplitude 2.4 V   Zone Setting Status Active    Zone Setting Status Active    Zone Setting Status [phone]    Lead Channel Impedance Value 623 ohm   Lead Channel Pacing Threshold Amplitude 0.8 V   Lead Channel Pacing Threshold Pulse Width 0.4 ms   HighPow Impedance 77 ohm   Battery Status MOS    Battery Remaining Longevity 12 mo   Battery Remaining Percentage 14 %   Brady Statistic RV Percent Paced 0 %  POC COVID-19     Status: Abnormal   Collection Time: 03/06/23  3:44 PM  Result Value Ref Range   SARS Coronavirus 2 Ag Positive (A) Negative         has a past medical history of AICD (automatic cardioverter/defibrillator) present, Allergy (09/20/1994), Anxiety, Arthritis, Back pain, Breast cancer (HCC) (2016), Bronchiectasis (HCC), Cerebral aneurysm, nonruptured, CHF (congestive heart failure) (HCC), Clostridium difficile infection, Depressive disorder, not elsewhere classified, Diverticulosis of colon (without mention of hemorrhage), Esophageal reflux, Fatty liver, Fibromyalgia, Gastritis, GERD (gastroesophageal reflux disease), GI bleed (2004), Glaucoma, Hiatal hernia, History of COVID-19 (05/04/2021), Hyperlipidemia, Hypertension, Hypothyroidism, Internal hemorrhoids, Joint pain, Multifocal pneumonia (06/03/2021), Obstructive sleep apnea (adult) (pediatric), Osteoarthritis, Ostium secundum type atrial septal defect, Other chronic nonalcoholic liver disease, Other pulmonary embolism and infarction, Palpitations, Paroxysmal ventricular tachycardia (HCC), Personal history of radiation therapy, PONV (postoperative nausea and vomiting), Presence of permanent cardiac pacemaker, PUD (peptic ulcer disease), Radiation (02/03/15-03/10/15), Sarcoid, Schatzki's ring, Shortness of breath, Sleep apnea, Stroke (HCC) (2013), Takotsubo syndrome,  Tubular adenoma of colon, Unspecified transient cerebral ischemia, and Unspecified vitamin D deficiency.   reports that she quit smoking about 33 years ago. Her smoking use included cigarettes. She started smoking about 53 years ago. She has a 20 pack-year smoking history. She has been exposed to tobacco smoke. She has never used smokeless tobacco.  Past Surgical History:  Procedure Laterality Date   ABDOMINAL HYSTERECTOMY  11/22/1998   ABI  2006   normal   BREAST EXCISIONAL BIOPSY     BREAST LUMPECTOMY Right 2016  BREAST LUMPECTOMY WITH RADIOACTIVE SEED LOCALIZATION Right 01/03/2015   Procedure: BREAST LUMPECTOMY WITH RADIOACTIVE SEED LOCALIZATION;  Surgeon: Chevis Pretty III, MD;  Location: [facility] OR;  Service: General;  Laterality: Right;   CARDIAC DEFIBRILLATOR PLACEMENT  2006; 2012   BSX single chamber ICD   carotid dopplers  2006   neg   CEREBRAL ANEURYSM REPAIR  02/1999   COLONOSCOPY     EYE SURGERY     HAMMER TOE SURGERY  11/15/2020   3rd digit bilateral feet    hospitalization  2004   GI bleed, PUD, diverticulosis (EGD,colonscopy)   hospitalization     PE, NSVT, s/p defib   JOINT REPLACEMENT  05/2021   KNEE ARTHROSCOPY     bilateral   LEFT HEART CATHETERIZATION WITH CORONARY ANGIOGRAM N/A 02/22/2012   Procedure: LEFT HEART CATHETERIZATION WITH CORONARY ANGIOGRAM;  Surgeon: Kathleene Hazel, MD;  Location: [facility] CATH LAB;  Service: Cardiovascular;  Laterality: N/A;   PARTIAL HYSTERECTOMY     Fibroids   TONGUE BIOPSY  12/12/2017   due to sore tongue and white patches/abnormal cells   TOTAL KNEE ARTHROPLASTY Left 02/13/2016   Procedure: TOTAL KNEE ARTHROPLASTY;  Surgeon: Dannielle Huh, MD;  Location: [facility] OR;  Service: Orthopedics;  Laterality: Left;   TOTAL KNEE ARTHROPLASTY Right 05/22/2021   Procedure: TOTAL KNEE ARTHROPLASTY;  Surgeon: Dannielle Huh, MD;  Location: [facility] ORS;  Service: Orthopedics;  Laterality: Right;  with block   TUBAL LIGATION  09/20/1978   UPPER GASTROINTESTINAL  ENDOSCOPY      Allergies  Allergen Reactions   Ace Inhibitors Swelling    Angioedema; makes tongue "break out"    Amitriptyline Hcl Other (See Comments)     makes her too sleepy!   Atorvastatin Other (See Comments)    SEVERE MYALGIA   Dilantin [Phenytoin Sodium Extended] Rash    Severe rash   Duloxetine Hcl Nausea Only and Other (See Comments)    Sleepiness/ sick  Other Reaction(s): GI Intolerance   Paroxetine Nausea Only    Rapid heartbeat   Ramipril Other (See Comments)    TONGUE ULCERS    Rosuvastatin Other (See Comments)    SEVERE MYALGIA   Carbamazepine Rash   Codeine Itching   Phenytoin Sodium Extended Rash   Simvastatin Other (See Comments)    Increase in CK, myalgias   Wellbutrin [Bupropion] Palpitations    Immunization History  Administered Date(s) Administered   Fluad Quad(high Dose 65+) 04/17/2019, 04/30/2020   H1N1 07/24/2008   Influenza Split 07/17/2011, 09/27/2011, 04/29/2012   Influenza Whole 07/20/2004, 07/04/2007, 05/07/2008, 05/19/2009, 05/17/2010   Influenza, High Dose Seasonal PF 05/30/2017, 05/21/2018   Influenza,inj,Quad PF,6+ Mos 05/20/2013, 05/06/2014, 05/17/2015, 05/15/2016, 04/27/2021   Influenza-Unspecified 05/07/2018   PFIZER(Purple Top)SARS-COV-2 Vaccination 09/25/2019, 10/16/2019, 06/04/2020, 01/04/2021, 08/06/2021   Pfizer Covid-19 Vaccine Bivalent Booster 86yrs & up 05/07/2022   Pneumococcal Conjugate-13 11/22/2015, 08/06/2021   Pneumococcal Polysaccharide-23 05/20/2002, 05/07/2008, 06/25/2014   Respiratory Syncytial Virus Vaccine,Recomb Aduvanted(Arexvy) 06/26/2022   Td 09/07/2009   Zoster, Live 09/21/2011    Family History  Problem Relation Age of Onset   Diabetes Mother    Alzheimer's disease Mother    Hypertension Mother    Obesity Mother    Arthritis Mother    Heart disease Father    Seizures Sister    Allergies Sister    Heart disease Sister    Early death Sister    Other Brother        Thyroid problem 10/2016    Hearing loss Brother  Vision loss Brother    Heart disease Brother    Prostate cancer Brother 29       same brother as throat cancer   Throat cancer Brother        dx in his 68s; also a smoker   Arthritis Brother    Hypertension Brother    Cancer Maternal Grandmother 21       colon cancer or abdominal cancer   Lung cancer Maternal Grandfather 80   Heart disease Son        Cardiac Arrest 07/2016   Breast cancer Maternal Aunt 55   Colon cancer Maternal Aunt 4       same sister as breast at 16   Breast cancer Maternal Aunt        dx in her 45s   Lung cancer Maternal Uncle    Pancreatic cancer Cousin 73       maternal first cousin   Breast cancer Cousin        paternal first cousin twice removed died in her 32s   Anxiety disorder Sister    Arthritis Sister    Depression Sister    Hypertension Sister    Stroke Neg Hx      Current Outpatient Medications:    albuterol (PROVENTIL) (2.5 MG/3ML) 0.083% nebulizer solution, USE 1 VIAL IN NEBULIZER EVERY 6 HOURS (Patient taking differently: Take 2.5 mg by nebulization every 6 (six) hours as needed for shortness of breath.), Disp: 120 mL, Rfl: 11   albuterol (VENTOLIN HFA) 108 (90 Base) MCG/ACT inhaler, Inhale 2 puffs into the lungs every 6 (six) hours as needed for wheezing or shortness of breath., Disp: 18 g, Rfl: 3   Ascorbic Acid (VITAMIN C) 1000 MG tablet, Take 1,000 mg by mouth daily., Disp: , Rfl:    aspirin EC 81 MG tablet, Take 81 mg by mouth daily. Swallow whole., Disp: , Rfl:    azelastine (ASTELIN) 0.1 % nasal spray, Place 1 spray into both nostrils 2 (two) times daily. Use in each nostril as directed, Disp: 90 mL, Rfl: 3   budesonide-formoterol (SYMBICORT) 80-4.5 MCG/ACT inhaler, Inhale 1 puff into the lungs 2 (two) times daily., Disp: 1 each, Rfl: 12   cetirizine (ZYRTEC) 10 MG tablet, Take 10 mg by mouth daily as needed for allergies., Disp: , Rfl:    Cholecalciferol (DIALYVITE VITAMIN D 5000) 125 MCG (5000 UT) capsule,  Take 5,000 Units by mouth daily., Disp: , Rfl:    cloBAZam (ONFI) 10 MG tablet, Take 1 tablet (10 mg total) by mouth 2 (two) times daily., Disp: 180 tablet, Rfl: 1   diltiazem (CARDIZEM CD) 120 MG 24 hr capsule, Take 1 capsule (120 mg total) by mouth daily., Disp: 90 capsule, Rfl: 3   empagliflozin (JARDIANCE) 10 MG TABS tablet, Take 1 tablet (10 mg total) by mouth daily., Disp: 90 tablet, Rfl: 3   Evolocumab (REPATHA SURECLICK) 140 MG/ML SOAJ, Inject 140 mg into the skin every 14 (fourteen) days., Disp: 6 mL, Rfl: 3   famotidine (PEPCID) 40 MG tablet, Take 1 tablet (40 mg total) by mouth at bedtime. NEEDS APPT FOR FURTHER REFILLS, Disp: 90 tablet, Rfl: 6   fexofenadine (ALLEGRA) 180 MG tablet, Take 180 mg by mouth daily as needed for allergies (alternates with zyrtec sometimes)., Disp: , Rfl:    fluticasone (CUTIVATE) 0.05 % cream, Apply topically 2 (two) times daily., Disp: 60 g, Rfl: 2   fluticasone (FLONASE) 50 MCG/ACT nasal spray, Place 2 sprays into both nostrils daily  as needed for allergies or rhinitis., Disp: 48 g, Rfl: 3   FOLIC ACID PO, Take 1 tablet by mouth daily., Disp: , Rfl:    furosemide (LASIX) 20 MG tablet, TAKE 2 TABLETS (40 MG) 3 DAYS PER WEEK AND TAKE 1 TABLET (20 MG) THE OTHER DAYS OF THE WEEK (Patient taking differently: Take 20 mg by mouth daily as needed. PER PT TAKES PRN), Disp: 130 tablet, Rfl: 1   hydroxypropyl methylcellulose / hypromellose (ISOPTO TEARS / GONIOVISC) 2.5 % ophthalmic solution, Place 1 drop into both eyes at bedtime., Disp: , Rfl:    ketorolac (ACULAR) 0.5 % ophthalmic solution, Place 1 drop into the right eye 4 (four) times daily., Disp: , Rfl:    latanoprost (XALATAN) 0.005 % ophthalmic solution, Place 1 drop into both eyes at bedtime., Disp: , Rfl:    levothyroxine (SYNTHROID) 50 MCG tablet, TAKE 1 TABLET EVERY DAY, Disp: 90 tablet, Rfl: 3   magnesium oxide (MAG-OX) 400 (240 Mg) MG tablet, TAKE 1 TABLET TWICE DAILY, Disp: 180 tablet, Rfl: 3    mometasone (ELOCON) 0.1 % lotion, Apply 1 application topically daily as needed (rash)., Disp: 60 mL, Rfl: 1   montelukast (SINGULAIR) 10 MG tablet, TAKE 1 TABLET AT BEDTIME, Disp: 90 tablet, Rfl: 3   Multiple Vitamins-Minerals (MULTIVITAMIN PO), Take 1 tablet by mouth daily., Disp: , Rfl:    nitroGLYCERIN (NITROSTAT) 0.4 MG SL tablet, DISSOLVE ONE TABLET UNDER THE TONGUE EVERY 5 MINUTES AS NEEDED FOR CHEST PAIN.  DO NOT EXCEED A TOTAL OF 3 DOSES IN 15 MINUTES (Patient taking differently: Place 0.4 mg under the tongue every 5 (five) minutes as needed for chest pain.), Disp: 25 tablet, Rfl: 0   nystatin (MYCOSTATIN) 100000 UNIT/ML suspension, Take 5 mLs (500,000 Units total) by mouth 4 (four) times daily., Disp: 60 mL, Rfl: 0   ofloxacin (OCUFLOX) 0.3 % ophthalmic solution, Place 1 drop into the right eye 4 (four) times daily., Disp: , Rfl:    pantoprazole (PROTONIX) 40 MG tablet, TAKE 1 TABLET EVERY DAY, Disp: 90 tablet, Rfl: 3   polyethylene glycol powder (GLYCOLAX/MIRALAX) powder, Take 17 grams by mouth daily, Disp: 1080 g, Rfl: 0   potassium chloride (KLOR-CON M) 10 MEQ tablet, TAKE 1/2 TABLET EVERY DAY (CALL OFFICE TO SCHEDULE YEARLY APPT), Disp: 45 tablet, Rfl: 3   prednisoLONE acetate (PRED FORTE) 1 % ophthalmic suspension, Place 1 drop into the right eye 4 (four) times daily., Disp: , Rfl:    predniSONE (DELTASONE) 10 MG tablet, 4 tabs for 2 days, then 3 tabs for 2 days, 2 tabs for 2 days, then 1 tab for 2 days, then stop, Disp: 20 tablet, Rfl: 0   promethazine-dextromethorphan (PROMETHAZINE-DM) 6.25-15 MG/5ML syrup, Take 5 mLs by mouth 4 (four) times daily as needed for cough., Disp: 118 mL, Rfl: 0   Respiratory Therapy Supplies (FLUTTER) DEVI, 1 Device by Does not apply route as needed., Disp: 1 each, Rfl: 0   sodium chloride HYPERTONIC 3 % nebulizer solution, Take by nebulization 2 (two) times daily as needed for cough. 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, Disp: 750 mL, Rfl: 12   tiZANidine (ZANAFLEX) 4 MG  tablet, Take 0.5-1 tablets (2-4 mg total) by mouth 2 (two) times daily., Disp: 180 tablet, Rfl: 1   traZODone (DESYREL) 100 MG tablet, TAKE 1 TO 1 AND 1/2 TABLETS AT BEDTIME, Disp: 135 tablet, Rfl: 3   triamcinolone cream (KENALOG) 0.1 %, Apply 1 Application topically 2 (two) times daily., Disp: 453.6 g, Rfl: 0  Current Facility-Administered Medications:    lidocaine (XYLOCAINE) 1 % (with pres) injection 1 mL, 1 mL, Other, Once, Lovorn, Megan, MD   lidocaine (XYLOCAINE) 1 % (with pres) injection 3 mL, 3 mL, Other, Once, Lovorn, Megan, MD   triamcinolone acetonide (KENALOG-40) injection 40 mg, 40 mg, Intramuscular, Once, Lovorn, Megan, MD      Objective:   Vitals:   03/29/23 1156  BP: 130/80  Pulse: (!) 109  SpO2: 95%  Weight: 218 lb 12.8 oz (99.2 kg)  Height: 5' 6.5" (1.689 m)    Estimated body mass index is 34.79 kg/m as calculated from the following:   Height as of this encounter: 5' 6.5" (1.689 m).   Weight as of this encounter: 218 lb 12.8 oz (99.2 kg).  @WEIGHTCHANGE @  [employer]   03/29/23 1156  Weight: 218 lb 12.8 oz (99.2 kg)     Physical Exam   General: No distress.  O2 at rest:yes today Cane present: n Sitting in wheel chair: no Frail: no Obese: yes Neuro: Alert and Oriented x 3. GCS 15. Speech normal Psych: Pleasant Resp:  Barrel Chest - no.  Wheeze - no, Crackles - yes base but also coarse, No overt respiratory distress CVS: Normal heart sounds. Murmurs - no Ext: Stigmata of Connective Tissue Disease - no HEENT: Normal upper airway. PEERL +. No post nasal drip. COUGHS        Assessment:       ICD-10-CM   1. IPF (idiopathic pulmonary fibrosis) (HCC)  J84.112     2. Acute bronchitis, unspecified organism  J20.9     3. History of 2019 novel coronavirus disease (COVID-19)  Z86.16     4. Abnormal LFTs  R79.89     5. Has no electricity in home  Z59.12          Plan:     Patient Instructions     ICD-10-CM   1. IPF (idiopathic  pulmonary fibrosis) (HCC)  J84.112     2. Acute bronchitis, unspecified organism  J20.9     3. History of 2019 novel coronavirus disease (COVID-19)  Z86.16     4. Abnormal LFTs  R79.89     5. Has no electricity in home  Z59.12       Not fully sure what is going on other than sounds like acute bronchitis. You are now at risk for getting pulmonary fibrosis flareup or developing other complications  Plan - Do blood work CBC with differential, D-dimer, troponin, BNP chemistry and liver function test  = stat  = Results might not come through the weekend and if you are getting worse to go to the emergency room -Call [employer] and try to restart following your house  -Look at asking for a form where they can put in a priority list because of your oxygen needs -Use oxygen with simple exertion and at night - Take prednisone 40 mg daily x 2 days, then 20mg  daily x 2 days, then 10mg  daily x 2 days, then 5mg  daily x 2 days and stop - Take doxycycline 100mg  po twice daily x 5 days; take after meals and avoid sunlight   Followup  - Keep previously scheduled follow-up with me on 04/11/2023  -Symptom score and sit/stand test at that time.   FOLLOWUP Return for  04/11/23 .    SIGNATURE    Dr. Kalman Shan, M.D., F.C.C.P,  Pulmonary and Critical Care Medicine Staff Physician, [facility] Director - Interstitial Lung Disease  Program  Pulmonary Fibrosis [facility] at [facility] [place], [place], [address]  Pager: [phone], If no answer or between  15:00h - 7:00h: call [phone] Telephone: [phone]  12:22 PM 03/29/2023

## 2023-04-01 ENCOUNTER — Ambulatory Visit (HOSPITAL_BASED_OUTPATIENT_CLINIC_OR_DEPARTMENT_OTHER): Payer: Medicare Other | Admitting: Pulmonary Disease

## 2023-04-01 ENCOUNTER — Other Ambulatory Visit: Payer: Self-pay | Admitting: Internal Medicine

## 2023-04-01 DIAGNOSIS — R7989 Other specified abnormal findings of blood chemistry: Secondary | ICD-10-CM

## 2023-04-01 NOTE — Telephone Encounter (Signed)
She is seeing Dr. Val Eagle  in September 2024.  I am not a sleep specialist.  She should follow-up the sleep results in September 2024 when she sees Dr. Val Eagle.  Most often sleep study results are not urgent and she can electively follow-up with him.  She already has appointment.   She wants Tessalon cough Perles.  Okay to dispense 200 mg 3 times daily to be taken as needed.  30-day supply with 3 refills.    Blood work from 03/29/2023 shows normal BNP normal chemistry normal CBC but significantly elevated D-dimer to 5.6.  She normally has high D-dimer but this is the most positive it has been   She has a history of pulmonary embolism in Jan 07, 2004 right lower lobe pulmonary artery./However subsequently She did not have pulmonary embolism in January 2023, indeterminate for pulmonary embolism October 2022 during COVID, - August 2020, negative study March 2017   Plan  - CT angiogram today or tomorrow to rule out pulmonary embolism      Current Outpatient Medications:    albuterol (PROVENTIL) (2.5 MG/3ML) 0.083% nebulizer solution, USE 1 VIAL IN NEBULIZER EVERY 6 HOURS (Patient taking differently: Take 2.5 mg by nebulization every 6 (six) hours as needed for shortness of breath.), Disp: 120 mL, Rfl: 11   albuterol (VENTOLIN HFA) 108 (90 Base) MCG/ACT inhaler, Inhale 2 puffs into the lungs every 6 (six) hours as needed for wheezing or shortness of breath., Disp: 18 g, Rfl: 3   Ascorbic Acid (VITAMIN C) 1000 MG tablet, Take 1,000 mg by mouth daily., Disp: , Rfl:    aspirin EC 81 MG tablet, Take 81 mg by mouth daily. Swallow whole., Disp: , Rfl:    azelastine (ASTELIN) 0.1 % nasal spray, Place 1 spray into both nostrils 2 (two) times daily. Use in each nostril as directed, Disp: 90 mL, Rfl: 3   budesonide-formoterol (SYMBICORT) 80-4.5 MCG/ACT inhaler, Inhale 1 puff into the lungs 2 (two) times daily., Disp: 1 each, Rfl: 12   cetirizine (ZYRTEC) 10 MG tablet, Take 10 mg by mouth daily as needed for  allergies., Disp: , Rfl:    Cholecalciferol (DIALYVITE VITAMIN D 5000) 125 MCG (5000 UT) capsule, Take 5,000 Units by mouth daily., Disp: , Rfl:    cloBAZam (ONFI) 10 MG tablet, Take 1 tablet (10 mg total) by mouth 2 (two) times daily., Disp: 180 tablet, Rfl: 1   diltiazem (CARDIZEM CD) 120 MG 24 hr capsule, Take 1 capsule (120 mg total) by mouth daily., Disp: 90 capsule, Rfl: 3   doxycycline (VIBRA-TABS) 100 MG tablet, Take 1 tablet (100 mg total) by mouth 2 (two) times daily., Disp: 10 tablet, Rfl: 0   empagliflozin (JARDIANCE) 10 MG TABS tablet, Take 1 tablet (10 mg total) by mouth daily., Disp: 90 tablet, Rfl: 3   Evolocumab (REPATHA SURECLICK) 140 MG/ML SOAJ, Inject 140 mg into the skin every 14 (fourteen) days., Disp: 6 mL, Rfl: 3   famotidine (PEPCID) 40 MG tablet, Take 1 tablet (40 mg total) by mouth at bedtime. NEEDS APPT FOR FURTHER REFILLS, Disp: 90 tablet, Rfl: 6   fexofenadine (ALLEGRA) 180 MG tablet, Take 180 mg by mouth daily as needed for allergies (alternates with zyrtec sometimes)., Disp: , Rfl:    fluticasone (CUTIVATE) 0.05 % cream, Apply topically 2 (two) times daily., Disp: 60 g, Rfl: 2   fluticasone (FLONASE) 50 MCG/ACT nasal spray, Place 2 sprays into both nostrils daily as needed for allergies or rhinitis., Disp: 48 g, Rfl: 3  FOLIC ACID PO, Take 1 tablet by mouth daily., Disp: , Rfl:    furosemide (LASIX) 20 MG tablet, TAKE 2 TABLETS (40 MG) 3 DAYS PER WEEK AND TAKE 1 TABLET (20 MG) THE OTHER DAYS OF THE WEEK (Patient taking differently: Take 20 mg by mouth daily as needed. PER PT TAKES PRN), Disp: 130 tablet, Rfl: 1   hydroxypropyl methylcellulose / hypromellose (ISOPTO TEARS / GONIOVISC) 2.5 % ophthalmic solution, Place 1 drop into both eyes at bedtime., Disp: , Rfl:    ketorolac (ACULAR) 0.5 % ophthalmic solution, Place 1 drop into the right eye 4 (four) times daily., Disp: , Rfl:    latanoprost (XALATAN) 0.005 % ophthalmic solution, Place 1 drop into both eyes at  bedtime., Disp: , Rfl:    levothyroxine (SYNTHROID) 50 MCG tablet, TAKE 1 TABLET EVERY DAY, Disp: 90 tablet, Rfl: 3   magnesium oxide (MAG-OX) 400 (240 Mg) MG tablet, TAKE 1 TABLET TWICE DAILY, Disp: 180 tablet, Rfl: 3   mometasone (ELOCON) 0.1 % lotion, Apply 1 application topically daily as needed (rash)., Disp: 60 mL, Rfl: 1   montelukast (SINGULAIR) 10 MG tablet, TAKE 1 TABLET AT BEDTIME, Disp: 90 tablet, Rfl: 3   Multiple Vitamins-Minerals (MULTIVITAMIN PO), Take 1 tablet by mouth daily., Disp: , Rfl:    nitroGLYCERIN (NITROSTAT) 0.4 MG SL tablet, DISSOLVE ONE TABLET UNDER THE TONGUE EVERY 5 MINUTES AS NEEDED FOR CHEST PAIN.  DO NOT EXCEED A TOTAL OF 3 DOSES IN 15 MINUTES (Patient taking differently: Place 0.4 mg under the tongue every 5 (five) minutes as needed for chest pain.), Disp: 25 tablet, Rfl: 0   nystatin (MYCOSTATIN) 100000 UNIT/ML suspension, Take 5 mLs (500,000 Units total) by mouth 4 (four) times daily., Disp: 60 mL, Rfl: 0   ofloxacin (OCUFLOX) 0.3 % ophthalmic solution, Place 1 drop into the right eye 4 (four) times daily., Disp: , Rfl:    pantoprazole (PROTONIX) 40 MG tablet, TAKE 1 TABLET EVERY DAY, Disp: 90 tablet, Rfl: 3   polyethylene glycol powder (GLYCOLAX/MIRALAX) powder, Take 17 grams by mouth daily, Disp: 1080 g, Rfl: 0   potassium chloride (KLOR-CON M) 10 MEQ tablet, TAKE 1/2 TABLET EVERY DAY (CALL OFFICE TO SCHEDULE YEARLY APPT), Disp: 45 tablet, Rfl: 3   prednisoLONE acetate (PRED FORTE) 1 % ophthalmic suspension, Place 1 drop into the right eye 4 (four) times daily., Disp: , Rfl:    predniSONE (DELTASONE) 10 MG tablet, 4 tabs for 2 days, then 3 tabs for 2 days, 2 tabs for 2 days, then 1 tab for 2 days, then stop, Disp: 20 tablet, Rfl: 0   predniSONE (DELTASONE) 10 MG tablet, Take 4 tablets (40 mg total) by mouth daily with breakfast for 2 days, THEN 2 tablets (20 mg total) daily with breakfast for 2 days, THEN 1 tablet (10 mg total) daily with breakfast for 2 days,  THEN 0.5 tablets (5 mg total) daily with breakfast for 2 days., Disp: 15 tablet, Rfl: 0   promethazine-dextromethorphan (PROMETHAZINE-DM) 6.25-15 MG/5ML syrup, Take 5 mLs by mouth 4 (four) times daily as needed for cough., Disp: 118 mL, Rfl: 0   Respiratory Therapy Supplies (FLUTTER) DEVI, 1 Device by Does not apply route as needed., Disp: 1 each, Rfl: 0   sodium chloride HYPERTONIC 3 % nebulizer solution, Take by nebulization 2 (two) times daily as needed for cough. 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, Disp: 750 mL, Rfl: 12   tiZANidine (ZANAFLEX) 4 MG tablet, Take 0.5-1 tablets (2-4 mg total) by mouth 2 (two)  times daily., Disp: 180 tablet, Rfl: 1   traZODone (DESYREL) 100 MG tablet, TAKE 1 TO 1 AND 1/2 TABLETS AT BEDTIME, Disp: 135 tablet, Rfl: 3   triamcinolone cream (KENALOG) 0.1 %, Apply 1 Application topically 2 (two) times daily., Disp: 453.6 g, Rfl: 0  Current Facility-Administered Medications:    lidocaine (XYLOCAINE) 1 % (with pres) injection 1 mL, 1 mL, Other, Once, Lovorn, Megan, MD   lidocaine (XYLOCAINE) 1 % (with pres) injection 3 mL, 3 mL, Other, Once, Lovorn, Megan, MD   triamcinolone acetonide (KENALOG-40) injection 40 mg, 40 mg, Intramuscular, Once, Lovorn, Aundra Millet, MD    Latest Reference Range & Units 03/23/19 15:22 06/03/21 09:59 08/29/21 13:12 03/29/23 13:33  D-Dimer, Quant <0.50 mcg/mL FEU 1.87 (H) 7.17 (H) 4.31 (H) 5.59 (H)  (H): Data is abnormally high   Recent Results (from the past 2160 hour(s))  Hepatic function panel     Status: Abnormal   Collection Time: 01/03/23  2:55 PM  Result Value Ref Range   Total Bilirubin 0.3 0.2 - 1.2 mg/dL   Bilirubin, Direct 0.1 0.0 - 0.3 mg/dL   Alkaline Phosphatase 125 (H) 39 - 117 U/L   AST 41 (H) 0 - 37 U/L   ALT 38 (H) 0 - 35 U/L   Total Protein 7.3 6.0 - 8.3 g/dL   Albumin 4.0 3.5 - 5.2 g/dL  Hepatic function panel     Status: Abnormal   Collection Time: 01/17/23  3:02 PM  Result Value Ref Range   Total Bilirubin 0.3 0.2 - 1.2 mg/dL    Bilirubin, Direct 0.0 0.0 - 0.3 mg/dL   Alkaline Phosphatase 137 (H) 39 - 117 U/L   AST 43 (H) 0 - 37 U/L   ALT 49 (H) 0 - 35 U/L   Total Protein 7.8 6.0 - 8.3 g/dL   Albumin 4.3 3.5 - 5.2 g/dL  Pulmonary function test     Status: None   Collection Time: 02/19/23 11:26 AM  Result Value Ref Range   FVC-Pre 2.25 L   FVC-%Pred-Pre 71 %   FEV1-Pre 1.97 L   FEV1-%Pred-Pre 82 %   FEV6-Pre 2.25 L   FEV6-%Pred-Pre 74 %   Pre FEV1/FVC ratio 88 %   FEV1FVC-%Pred-Pre 116 %   Pre FEV6/FVC Ratio 100 %   FEV6FVC-%Pred-Pre 104 %   FEF 25-75 Pre 1.91 L/sec   FEF2575-%Pred-Pre 103 %   DLCO unc 11.85 ml/min/mmHg   DLCO unc % pred 56 %   DLCO cor 11.85 ml/min/mmHg   DLCO cor % pred 56 %   DL/VA 7.25 ml/min/mmHg/L   DL/VA % pred 94 %  Hepatic function panel     Status: Abnormal   Collection Time: 02/22/23  2:53 PM  Result Value Ref Range   Total Bilirubin 0.4 0.2 - 1.2 mg/dL   Bilirubin, Direct 0.1 0.0 - 0.3 mg/dL   Alkaline Phosphatase 113 39 - 117 U/L   AST 39 (H) 0 - 37 U/L   ALT 54 (H) 0 - 35 U/L   Total Protein 7.3 6.0 - 8.3 g/dL   Albumin 4.1 3.5 - 5.2 g/dL  [id number]     Status: None   Collection Time: 02/22/23  2:53 PM  Result Value Ref Range   A-1 Antitrypsin, Ser 125 83 - 199 mg/dL  CK     Status: Abnormal   Collection Time: 02/22/23  2:53 PM  Result Value Ref Range   Total CK 215 (H) 7 - 177 U/L  Mitochondrial antibodies  Status: None   Collection Time: 02/22/23  2:53 PM  Result Value Ref Range   Mitochondrial M2 Ab, IgG <=20.0 <=20.0 U    Comment: . Reference Range:    Negative:  <=20.0      U    Equivocal: 20.1 - 24.9 U    Positive:  >=25.0      U .   Anti-smooth muscle antibody, IgG     Status: None   Collection Time: 02/22/23  2:53 PM  Result Value Ref Range   Actin (Smooth Muscle) Antibody (IGG) <20 <20 U    Comment: . Reference Range:    <20 U: Negative >or=20 U: Positive . [name] Antibodies recognizing actin are the main component of smooth  muscle antibodies associated with auto- immune liver disease. Actin antibodies are found in approximately 75% of patients with autoimmune hepatitis (AIH) type 1, approximately 65% of patients with autoimmune cholangitis, approximately 30% of patients with primary biliary cirrhosis and approximately 2% of healthy controls. High values are closely correlated with AIH type 1. .   Hepatitis B core antibody, total     Status: None   Collection Time: 02/22/23  2:53 PM  Result Value Ref Range   Hep B Core Total Ab NON-REACTIVE NON-REACTIVE    Comment: . For additional information, please refer to  http://education.[facility].com/faq/FAQ202  (This link is being provided for informational/ educational purposes only.) .   Hepatitis B surface antigen     Status: None   Collection Time: 02/22/23  2:53 PM  Result Value Ref Range   Hepatitis B Surface Ag NON-REACTIVE NON-REACTIVE    Comment: . For additional information, please refer to  http://education.[facility].com/faq/FAQ202  (This link is being provided for informational/ educational purposes only.) .   CUP PACEART REMOTE DEVICE CHECK     Status: None   Collection Time: 02/27/23  8:26 AM  Result Value Ref Range   Date Time Interrogation Session [id number]    Pulse Generator Manufacturer BOST    Pulse Gen Model E102 TELIGEN 100    Pulse Gen Serial Number [id number]    Clinic Name [facility]    Implantable Pulse Generator Type Implantable Cardiac Defibulator    Implantable Pulse Generator Implant Date 24401027    Implantable Lead Manufacturer GUIC    Implantable Lead Model 0185 Endotak Reliance G    Implantable Lead Serial Number [id number]    Implantable Lead Implant Date 25366440    Implantable Lead Location Detail 1 APEX    Implantable Lead Location [id number]    Implantable Lead Connection Status [id number]    Lead Channel Setting Sensing Sensitivity 0.4 mV   Lead Channel Setting Sensing Adaptation Mode Adaptive  Sensing    Lead Channel Setting Pacing Pulse Width 0.4 ms   Lead Channel Setting Pacing Amplitude 2.4 V   Zone Setting Status Active    Zone Setting Status Active    Zone Setting Status [phone]    Lead Channel Impedance Value 623 ohm   Lead Channel Pacing Threshold Amplitude 0.8 V   Lead Channel Pacing Threshold Pulse Width 0.4 ms   HighPow Impedance 77 ohm   Battery Status MOS    Battery Remaining Longevity 12 mo   Battery Remaining Percentage 14 %   Brady Statistic RV Percent Paced 0 %  POC COVID-19     Status: Abnormal   Collection Time: 03/06/23  3:44 PM  Result Value Ref Range   SARS Coronavirus 2 Ag Positive (A) Negative  Hepatic function panel  Status: Abnormal   Collection Time: 03/29/23  1:33 PM  Result Value Ref Range   Total Bilirubin 0.6 0.2 - 1.2 mg/dL   Bilirubin, Direct 0.1 0.0 - 0.3 mg/dL   Alkaline Phosphatase 125 (H) 39 - 117 U/L   AST 28 0 - 37 U/L   ALT 33 0 - 35 U/L   Total Protein 7.5 6.0 - 8.3 g/dL   Albumin 4.0 3.5 - 5.2 g/dL  D-Dimer, Quantitative     Status: Abnormal   Collection Time: 03/29/23  1:33 PM  Result Value Ref Range   D-Dimer, Quant 5.59 (H) <0.50 mcg/mL FEU    Comment: . The D-Dimer test is used frequently to exclude an acute PE or DVT. In patients with a low to moderate clinical risk assessment and a D-Dimer result <0.50 mcg/mL FEU, the likelihood of a PE or DVT is very low. However, a thromboembolic event should not be excluded solely on the basis of the D-Dimer level. Increased levels of D-Dimer are associated with a PE, DVT, DIC, malignancies, inflammation, sepsis, surgery, trauma, pregnancy, and advancing patient age. [Jama 2006 11:295(2):199-207] . For additional information, please refer to: http://education.[facility].com/faq/FAQ149 (This link is being provided for informational/ educational purposes only) .   CBC w/Diff     Status: Abnormal   Collection Time: 03/29/23  1:33 PM  Result Value Ref Range   WBC  8.1 4.0 - 10.5 K/uL   RBC 4.69 3.87 - 5.11 Mil/uL   Hemoglobin 14.2 12.0 - 15.0 g/dL   HCT 78.2 95.6 - 21.3 %   MCV 93.5 78.0 - 100.0 fl   MCHC 32.3 30.0 - 36.0 g/dL   RDW 08.6 57.8 - 46.9 %   Platelets 190.0 150.0 - 400.0 K/uL   Neutrophils Relative % 79.1 (H) 43.0 - 77.0 %   Lymphocytes Relative 8.3 (L) 12.0 - 46.0 %   Monocytes Relative 10.0 3.0 - 12.0 %   Eosinophils Relative 1.8 0.0 - 5.0 %   Basophils Relative 0.8 0.0 - 3.0 %   Neutro Abs 6.4 1.4 - 7.7 K/uL   Lymphs Abs 0.7 0.7 - 4.0 K/uL   Monocytes Absolute 0.8 0.1 - 1.0 K/uL   Eosinophils Absolute 0.1 0.0 - 0.7 K/uL   Basophils Absolute 0.1 0.0 - 0.1 K/uL  Basic Metabolic Panel (BMET)     Status: Abnormal   Collection Time: 03/29/23  1:33 PM  Result Value Ref Range   Sodium 134 (L) 135 - 145 mEq/L   Potassium 4.0 3.5 - 5.1 mEq/L   Chloride 103 96 - 112 mEq/L   CO2 22 19 - 32 mEq/L   Glucose, Bld 103 (H) 70 - 99 mg/dL   BUN 10 6 - 23 mg/dL   Creatinine, Ser 6.29 0.40 - 1.20 mg/dL   GFR 52.84 >13.24 mL/min    Comment: Calculated using the CKD-EPI Creatinine Equation (2021)   Calcium 9.9 8.4 - 10.5 mg/dL  B Nat Peptide     Status: None   Collection Time: 03/29/23  1:33 PM  Result Value Ref Range   Pro B Natriuretic peptide (BNP) 84.0 0.0 - 100.0 pg/mL

## 2023-04-02 ENCOUNTER — Telehealth: Payer: Self-pay | Admitting: Internal Medicine

## 2023-04-02 ENCOUNTER — Other Ambulatory Visit: Payer: Self-pay

## 2023-04-02 ENCOUNTER — Inpatient Hospital Stay (HOSPITAL_COMMUNITY)
Admission: EM | Admit: 2023-04-02 | Discharge: 2023-04-04 | DRG: 175 | Disposition: A | Discharge status: Home / Self Care | Payer: Medicare Other | Attending: Internal Medicine | Admitting: Internal Medicine

## 2023-04-02 ENCOUNTER — Encounter (HOSPITAL_COMMUNITY): Payer: Self-pay

## 2023-04-02 ENCOUNTER — Inpatient Hospital Stay: Admit: 2023-04-02 | Payer: Medicare Other | Admitting: Internal Medicine

## 2023-04-02 ENCOUNTER — Emergency Department (HOSPITAL_BASED_OUTPATIENT_CLINIC_OR_DEPARTMENT_OTHER): Payer: Medicare Other

## 2023-04-02 ENCOUNTER — Ambulatory Visit (HOSPITAL_BASED_OUTPATIENT_CLINIC_OR_DEPARTMENT_OTHER)
Admission: RE | Admit: 2023-04-02 | Discharge: 2023-04-02 | Disposition: A | Discharge status: Home / Self Care | Payer: Medicare Other | Source: Ambulatory Visit | Attending: Internal Medicine | Admitting: Internal Medicine

## 2023-04-02 DIAGNOSIS — M79661 Pain in right lower leg: Secondary | ICD-10-CM | POA: Diagnosis not present

## 2023-04-02 DIAGNOSIS — Z8711 Personal history of peptic ulcer disease: Secondary | ICD-10-CM

## 2023-04-02 DIAGNOSIS — Z86010 Personal history of colonic polyps: Secondary | ICD-10-CM

## 2023-04-02 DIAGNOSIS — Z885 Allergy status to narcotic agent status: Secondary | ICD-10-CM

## 2023-04-02 DIAGNOSIS — I480 Paroxysmal atrial fibrillation: Secondary | ICD-10-CM | POA: Diagnosis present

## 2023-04-02 DIAGNOSIS — Z803 Family history of malignant neoplasm of breast: Secondary | ICD-10-CM

## 2023-04-02 DIAGNOSIS — K219 Gastro-esophageal reflux disease without esophagitis: Secondary | ICD-10-CM | POA: Diagnosis present

## 2023-04-02 DIAGNOSIS — Z923 Personal history of irradiation: Secondary | ICD-10-CM

## 2023-04-02 DIAGNOSIS — Z8673 Personal history of transient ischemic attack (TIA), and cerebral infarction without residual deficits: Secondary | ICD-10-CM

## 2023-04-02 DIAGNOSIS — Z888 Allergy status to other drugs, medicaments and biological substances status: Secondary | ICD-10-CM

## 2023-04-02 DIAGNOSIS — R0602 Shortness of breath: Secondary | ICD-10-CM | POA: Diagnosis not present

## 2023-04-02 DIAGNOSIS — Z8261 Family history of arthritis: Secondary | ICD-10-CM

## 2023-04-02 DIAGNOSIS — Z818 Family history of other mental and behavioral disorders: Secondary | ICD-10-CM

## 2023-04-02 DIAGNOSIS — J8410 Pulmonary fibrosis, unspecified: Secondary | ICD-10-CM | POA: Diagnosis present

## 2023-04-02 DIAGNOSIS — I2729 Other secondary pulmonary hypertension: Secondary | ICD-10-CM | POA: Diagnosis present

## 2023-04-02 DIAGNOSIS — I2694 Multiple subsegmental pulmonary emboli without acute cor pulmonale: Secondary | ICD-10-CM | POA: Diagnosis not present

## 2023-04-02 DIAGNOSIS — Z820 Family history of epilepsy and other diseases of the nervous system: Secondary | ICD-10-CM

## 2023-04-02 DIAGNOSIS — Z7951 Long term (current) use of inhaled steroids: Secondary | ICD-10-CM

## 2023-04-02 DIAGNOSIS — Z86711 Personal history of pulmonary embolism: Secondary | ICD-10-CM

## 2023-04-02 DIAGNOSIS — Z90711 Acquired absence of uterus with remaining cervical stump: Secondary | ICD-10-CM

## 2023-04-02 DIAGNOSIS — Z79890 Hormone replacement therapy (postmenopausal): Secondary | ICD-10-CM

## 2023-04-02 DIAGNOSIS — Z833 Family history of diabetes mellitus: Secondary | ICD-10-CM

## 2023-04-02 DIAGNOSIS — Z7984 Long term (current) use of oral hypoglycemic drugs: Secondary | ICD-10-CM

## 2023-04-02 DIAGNOSIS — Z9181 History of falling: Secondary | ICD-10-CM

## 2023-04-02 DIAGNOSIS — I5032 Chronic diastolic (congestive) heart failure: Secondary | ICD-10-CM | POA: Diagnosis present

## 2023-04-02 DIAGNOSIS — J9601 Acute respiratory failure with hypoxia: Secondary | ICD-10-CM | POA: Diagnosis present

## 2023-04-02 DIAGNOSIS — Z96653 Presence of artificial knee joint, bilateral: Secondary | ICD-10-CM | POA: Diagnosis present

## 2023-04-02 DIAGNOSIS — Z800 Family history of malignant neoplasm of digestive organs: Secondary | ICD-10-CM

## 2023-04-02 DIAGNOSIS — Z8241 Family history of sudden cardiac death: Secondary | ICD-10-CM

## 2023-04-02 DIAGNOSIS — I82412 Acute embolism and thrombosis of left femoral vein: Secondary | ICD-10-CM | POA: Diagnosis present

## 2023-04-02 DIAGNOSIS — Z853 Personal history of malignant neoplasm of breast: Secondary | ICD-10-CM

## 2023-04-02 DIAGNOSIS — Z87891 Personal history of nicotine dependence: Secondary | ICD-10-CM

## 2023-04-02 DIAGNOSIS — Z171 Estrogen receptor negative status [ER-]: Secondary | ICD-10-CM

## 2023-04-02 DIAGNOSIS — E039 Hypothyroidism, unspecified: Secondary | ICD-10-CM | POA: Diagnosis present

## 2023-04-02 DIAGNOSIS — Z7902 Long term (current) use of antithrombotics/antiplatelets: Secondary | ICD-10-CM

## 2023-04-02 DIAGNOSIS — Z7982 Long term (current) use of aspirin: Secondary | ICD-10-CM

## 2023-04-02 DIAGNOSIS — Z801 Family history of malignant neoplasm of trachea, bronchus and lung: Secondary | ICD-10-CM

## 2023-04-02 DIAGNOSIS — I2699 Other pulmonary embolism without acute cor pulmonale: Secondary | ICD-10-CM | POA: Diagnosis not present

## 2023-04-02 DIAGNOSIS — Z79899 Other long term (current) drug therapy: Secondary | ICD-10-CM

## 2023-04-02 DIAGNOSIS — K760 Fatty (change of) liver, not elsewhere classified: Secondary | ICD-10-CM | POA: Diagnosis present

## 2023-04-02 DIAGNOSIS — I110 Hypertensive heart disease with heart failure: Secondary | ICD-10-CM | POA: Diagnosis present

## 2023-04-02 DIAGNOSIS — I428 Other cardiomyopathies: Secondary | ICD-10-CM | POA: Diagnosis present

## 2023-04-02 DIAGNOSIS — Z95810 Presence of automatic (implantable) cardiac defibrillator: Secondary | ICD-10-CM

## 2023-04-02 DIAGNOSIS — R7989 Other specified abnormal findings of blood chemistry: Secondary | ICD-10-CM

## 2023-04-02 DIAGNOSIS — M797 Fibromyalgia: Secondary | ICD-10-CM | POA: Diagnosis present

## 2023-04-02 DIAGNOSIS — Z808 Family history of malignant neoplasm of other organs or systems: Secondary | ICD-10-CM

## 2023-04-02 DIAGNOSIS — Z821 Family history of blindness and visual loss: Secondary | ICD-10-CM

## 2023-04-02 DIAGNOSIS — Z8709 Personal history of other diseases of the respiratory system: Secondary | ICD-10-CM

## 2023-04-02 DIAGNOSIS — Z8249 Family history of ischemic heart disease and other diseases of the circulatory system: Secondary | ICD-10-CM

## 2023-04-02 DIAGNOSIS — N3281 Overactive bladder: Secondary | ICD-10-CM | POA: Diagnosis present

## 2023-04-02 DIAGNOSIS — Z8616 Personal history of COVID-19: Secondary | ICD-10-CM

## 2023-04-02 DIAGNOSIS — I82432 Acute embolism and thrombosis of left popliteal vein: Secondary | ICD-10-CM | POA: Diagnosis present

## 2023-04-02 DIAGNOSIS — Z8042 Family history of malignant neoplasm of prostate: Secondary | ICD-10-CM

## 2023-04-02 DIAGNOSIS — E785 Hyperlipidemia, unspecified: Secondary | ICD-10-CM | POA: Diagnosis present

## 2023-04-02 LAB — COMPREHENSIVE METABOLIC PANEL
ALT: 35 U/L (ref 0–44)
AST: 27 U/L (ref 15–41)
Albumin: 3.7 g/dL (ref 3.5–5.0)
Alkaline Phosphatase: 109 U/L (ref 38–126)
Anion gap: 6 (ref 5–15)
BUN: 12 mg/dL (ref 8–23)
CO2: 26 mmol/L (ref 22–32)
Calcium: 9.5 mg/dL (ref 8.9–10.3)
Chloride: 105 mmol/L (ref 98–111)
Creatinine, Ser: 0.83 mg/dL (ref 0.44–1.00)
GFR, Estimated: >60 mL/min (ref >60)
Glucose, Bld: 132 mg/dL — ABNORMAL HIGH (ref 70–99)
Potassium: 4.5 mmol/L (ref 3.5–5.1)
Sodium: 137 mmol/L (ref 135–145)
Total Bilirubin: 0.5 mg/dL (ref 0.3–1.2)
Total Protein: 7.5 g/dL (ref 6.5–8.1)

## 2023-04-02 LAB — CBC WITH DIFFERENTIAL/PLATELET
Abs Immature Granulocytes: 0.03 10*3/uL (ref 0.00–0.07)
Basophils Absolute: 0 10*3/uL (ref 0.0–0.1)
Basophils Relative: 0 %
Eosinophils Absolute: 0 10*3/uL (ref 0.0–0.5)
Eosinophils Relative: 0 %
HCT: 44.3 % (ref 36.0–46.0)
Hemoglobin: 14.2 g/dL (ref 12.0–15.0)
Immature Granulocytes: 1 %
Lymphocytes Relative: 5 %
Lymphs Abs: 0.3 10*3/uL — ABNORMAL LOW (ref 0.7–4.0)
MCH: 30.7 pg (ref 26.0–34.0)
MCHC: 32.1 g/dL (ref 30.0–36.0)
MCV: 95.7 fL (ref 80.0–100.0)
Monocytes Absolute: 0.2 10*3/uL (ref 0.1–1.0)
Monocytes Relative: 2 %
Neutro Abs: 5.8 10*3/uL (ref 1.7–7.7)
Neutrophils Relative %: 92 %
Platelets: 212 10*3/uL (ref 150–400)
RBC: 4.63 MIL/uL (ref 3.87–5.11)
RDW: 13.2 % (ref 11.5–15.5)
WBC: 6.3 10*3/uL (ref 4.0–10.5)
nRBC: 0 % (ref 0.0–0.2)

## 2023-04-02 LAB — D-DIMER, QUANTITATIVE: D-Dimer, Quant: 6.22 ug/mL-FEU — ABNORMAL HIGH (ref 0.00–0.50)

## 2023-04-02 LAB — BRAIN NATRIURETIC PEPTIDE: B Natriuretic Peptide: 63.8 pg/mL (ref 0.0–100.0)

## 2023-04-02 LAB — TROPONIN I (HIGH SENSITIVITY)
Troponin I (High Sensitivity): 7 ng/L (ref <18)
Troponin I (High Sensitivity): 8 ng/L (ref <18)

## 2023-04-02 MED ORDER — BENZONATATE 200 MG PO CAPS: 200.0000 mg | ORAL_CAPSULE | Freq: Three times a day (TID) | ORAL | 2 refills | Status: DC | PRN

## 2023-04-02 MED ORDER — FLUTICASONE PROPIONATE 50 MCG/ACT NA SUSP: 2.0000 | Freq: Every day | NASAL | Status: DC | PRN

## 2023-04-02 MED ORDER — ALBUTEROL SULFATE (2.5 MG/3ML) 0.083% IN NEBU: 2.5000 mg | INHALATION_SOLUTION | RESPIRATORY_TRACT | Status: DC | PRN

## 2023-04-02 MED ORDER — HEPARIN (PORCINE) 25000 UT/250ML-% IV SOLN
1250.0000 [IU]/h | INTRAVENOUS | Status: DC
  Administered 2023-04-02: 1400 [IU]/h via INTRAVENOUS
  Administered 2023-04-03: 1250 [IU]/h via INTRAVENOUS
  Filled 2023-04-02 (×2): qty 250

## 2023-04-02 MED ORDER — HEPARIN BOLUS VIA INFUSION
5000.0000 [IU] | Freq: Once | INTRAVENOUS | Status: AC
  Administered 2023-04-02: 5000 [IU] via INTRAVENOUS
  Filled 2023-04-02: qty 5000

## 2023-04-02 MED ORDER — LEVOCETIRIZINE DIHYDROCHLORIDE 5 MG PO TABS: 5.0000 mg | ORAL_TABLET | Freq: Every day | ORAL | Status: DC

## 2023-04-02 MED ORDER — MOMETASONE FURO-FORMOTEROL FUM 100-5 MCG/ACT IN AERO
2.0000 | INHALATION_SPRAY | Freq: Two times a day (BID) | RESPIRATORY_TRACT | Status: DC
  Administered 2023-04-03 – 2023-04-04 (×3): 2 via RESPIRATORY_TRACT
  Filled 2023-04-02: qty 8.8

## 2023-04-02 MED ORDER — AZELASTINE HCL 0.1 % NA SOLN
1.0000 | Freq: Two times a day (BID) | NASAL | Status: DC
  Administered 2023-04-02 – 2023-04-04 (×4): 1 via NASAL
  Filled 2023-04-02: qty 30

## 2023-04-02 MED ORDER — TIZANIDINE HCL 4 MG PO TABS
2.0000 mg | ORAL_TABLET | Freq: Once | ORAL | Status: AC | PRN
  Administered 2023-04-02: 2 mg via ORAL
  Filled 2023-04-02: qty 1

## 2023-04-02 MED ORDER — TRAZODONE HCL 100 MG PO TABS
100.0000 mg | ORAL_TABLET | Freq: Every evening | ORAL | Status: DC | PRN
  Administered 2023-04-02: 100 mg via ORAL
  Filled 2023-04-02: qty 1

## 2023-04-02 MED ORDER — PANTOPRAZOLE SODIUM 40 MG PO TBEC
40.0000 mg | DELAYED_RELEASE_TABLET | Freq: Every day | ORAL | Status: DC
  Administered 2023-04-03 – 2023-04-04 (×2): 40 mg via ORAL
  Filled 2023-04-02 (×2): qty 1

## 2023-04-02 MED ORDER — DILTIAZEM HCL ER COATED BEADS 120 MG PO CP24
120.0000 mg | ORAL_CAPSULE | Freq: Every day | ORAL | Status: DC
  Administered 2023-04-03 – 2023-04-04 (×2): 120 mg via ORAL
  Filled 2023-04-02 (×2): qty 1

## 2023-04-02 MED ORDER — BENZONATATE 100 MG PO CAPS: 200.0000 mg | ORAL_CAPSULE | Freq: Three times a day (TID) | ORAL | Status: DC | PRN

## 2023-04-02 MED ORDER — ONDANSETRON HCL 4 MG PO TABS: 4.0000 mg | ORAL_TABLET | Freq: Four times a day (QID) | ORAL | Status: DC | PRN

## 2023-04-02 MED ORDER — MONTELUKAST SODIUM 10 MG PO TABS
10.0000 mg | ORAL_TABLET | Freq: Every day | ORAL | Status: DC
  Administered 2023-04-02 – 2023-04-03 (×2): 10 mg via ORAL
  Filled 2023-04-02 (×2): qty 1

## 2023-04-02 MED ORDER — ACETAMINOPHEN 325 MG PO TABS: 650.0000 mg | ORAL_TABLET | Freq: Four times a day (QID) | ORAL | Status: DC | PRN

## 2023-04-02 MED ORDER — ONDANSETRON HCL 4 MG/2ML IJ SOLN: 4.0000 mg | Freq: Four times a day (QID) | INTRAMUSCULAR | Status: DC | PRN

## 2023-04-02 MED ORDER — PREDNISONE 10 MG PO TABS
10.0000 mg | ORAL_TABLET | Freq: Every day | ORAL | Status: AC
  Administered 2023-04-03: 10 mg via ORAL
  Filled 2023-04-02: qty 1

## 2023-04-02 MED ORDER — IOHEXOL 350 MG/ML SOLN
100.0000 mL | Freq: Once | INTRAVENOUS | Status: AC | PRN
  Administered 2023-04-02: 75 mL via INTRAVENOUS

## 2023-04-02 MED ORDER — ASPIRIN 81 MG PO TBEC
81.0000 mg | DELAYED_RELEASE_TABLET | Freq: Every day | ORAL | Status: DC
  Administered 2023-04-03: 81 mg via ORAL
  Filled 2023-04-02 (×2): qty 1

## 2023-04-02 MED ORDER — LORATADINE 10 MG PO TABS
10.0000 mg | ORAL_TABLET | Freq: Every day | ORAL | Status: DC
  Administered 2023-04-03 – 2023-04-04 (×2): 10 mg via ORAL
  Filled 2023-04-02 (×2): qty 1

## 2023-04-02 MED ORDER — ACETAMINOPHEN 650 MG RE SUPP: 650.0000 mg | Freq: Four times a day (QID) | RECTAL | Status: DC | PRN

## 2023-04-02 MED ORDER — LEVOTHYROXINE SODIUM 50 MCG PO TABS
50.0000 ug | ORAL_TABLET | Freq: Every day | ORAL | Status: DC
  Administered 2023-04-03 – 2023-04-04 (×2): 50 ug via ORAL
  Filled 2023-04-02 (×2): qty 1

## 2023-04-02 MED ORDER — PREDNISONE 5 MG PO TABS
5.0000 mg | ORAL_TABLET | Freq: Every day | ORAL | Status: DC
  Administered 2023-04-04: 5 mg via ORAL
  Filled 2023-04-02 (×2): qty 1

## 2023-04-02 NOTE — Telephone Encounter (Signed)
Called and spoke with [facility] radiology.  They were not wanting to speak with nursing staff, they were trying to reach Dr. Derrek Monaco.  They were able to reach him on his cell phone.  Nothing further needed.

## 2023-04-02 NOTE — Telephone Encounter (Signed)
I called and spoke with the pt and notified of MR's response 04/01/23  Rx for tessalon refilled and ordered STAT CTA.

## 2023-04-02 NOTE — Telephone Encounter (Signed)
Spoke to pt and advised her to go to the nearest ER as recommended by Dr [name]. Pt stated she will be heading to [facility]. I informed pt that Dr [name] will call the [facility] to let the ER doctor for head up on pt arriving at ER.

## 2023-04-02 NOTE — Telephone Encounter (Signed)
  Please call [name]  1) give her diagnosis of PE 2) tell her needs admission 3) bed requst for [name] in and she should have a bed later today - [facility] is admitting and accepting doc is Dr [name] 4) she should not refuse admission - likely for 2-4 days admission  This is her 2nd clost   CT Angio Chest W/Cm &/Or Wo Cm  Result Date: 04/02/2023 CLINICAL DATA:  Dyspnea.  Upper back pain.  Bronchitis. EXAM: CT ANGIOGRAPHY CHEST WITH CONTRAST TECHNIQUE: Multidetector CT imaging of the chest was performed using the standard protocol during bolus administration of intravenous contrast. Multiplanar CT image reconstructions and MIPs were obtained to evaluate the vascular anatomy. RADIATION DOSE REDUCTION: This exam was performed according to the departmental dose-optimization program which includes automated exposure control, adjustment of the mA and/or kV according to patient size and/or use of iterative reconstruction technique. CONTRAST:  75mL OMNIPAQUE IOHEXOL 350 MG/ML SOLN COMPARISON:  X-ray 03/12/2023 prior CT scans including 07/06/2022 and older FINDINGS: Cardiovascular: Heart is slightly enlarged. No pericardial effusion. Thoracic aorta has a normal course and caliber with scattered atherosclerotic calcified plaque. There is a left upper chest battery pack for defibrillator with leads extending along the right side of the heart pulmonary emboli are identified. There are areas of thrombus along the central right main pulmonary artery at the hilum as well as extending into the lobar and segmental branches in the right lung. There is some segmental and subsegmental emboli as well in the left upper lobe. There is some breathing motion which limits evaluation of small and peripheral emboli RV to LV ratio 1.02. No reflux of contrast into the intrahepatic IVC slight enlargement of the main pulmonary artery. Please correlate with clinical evidence of right heart strain. Mediastinum/Nodes: No  specific abnormal lymph node enlargement identified in the axillary region, hilum or mediastinum. Slightly patulous thoracic esophagus small thyroid gland. Lungs/Pleura: No consolidation, pneumothorax or effusion. As described previously there are areas of interstitial septal thickening, bronchiectasis scattered in both lungs greatest in the lower lung zones. There is slightly more confluent ground-glass along the lung bases today compared to the previous but this could be related to the motion and level of inflation. Upper Abdomen: Adrenal glands are preserved in the upper abdomen. Colonic diverticula. Musculoskeletal: Diffuse degenerative changes along the spine with bridging osteophytes and syndesmophytes diffusely Review of the MIP images confirms the above findings. Critical Value/emergent results were called by telephone at the time of interpretation on 04/02/2023 at 7:26 am to provider [facility] , who verbally acknowledged these results. IMPRESSION: Bilateral pulmonary emboli, right-greater-than-left. Some central emboli on the right. Overall moderate clot burden. Enlarged heart with some enlargement of the pulmonary artery but this has seen previously. Please correlate for any clinical evidence of right heart strain. Defibrillator. Known interstitial lung disease. Aortic Atherosclerosis (ICD10-I70.0) and Emphysema (ICD10-J43.9). Electronically Signed   By: [name] M.D.   On: 04/02/2023 10:55

## 2023-04-02 NOTE — Telephone Encounter (Signed)
So [facility]NOT ADMIT patient becaseuse patient not seen in 24h in our offfice  Plan  - have patient go to [facility] - [facility] or [facility] or [facility] or [facility]    [address] [place] [address]

## 2023-04-02 NOTE — Telephone Encounter (Signed)
Gave sign out to [facility] er doc Dr Anders Simmonds approx 30 min ago

## 2023-04-02 NOTE — ED Provider Notes (Signed)
[facility] Provider Note   CSN: [id number] Arrival date & time: 04/02/23  1358    History  Abd CT   Aliegha Hockenberry Wendland is a 72 y.o. female history of prior brain aneurysm in 2001, overactive bladder, nonischemic cardiomyopathy, pulmonary fibrosis, bronchiectasis here for evaluation of abnormal CT.  Patient states on Friday she developed pain to her central chest as well as her right lateral chest.  Worse to take a deep breath.  She felt like she had some dyspnea on exertion.  Improved at rest.  She was previously on 2 L of oxygen via nasal cannula in 2022 however was weaned off of oxygen.  She still had oxygen concentrator at home and subsequently placed herself back on 2 L to help with her breathing.  States over the last few months she has been dealing with intermittent pain to her bilateral calves.  States her CK level was elevated which her primary care doctor thought was likely related to her statin which she has been changed about.  She denies any recent injury or trauma.  No swelling, redness to lower extremities.  No numbness or weakness.  No back pain, headache, fever, nausea, vomiting.  Of note she was diagnosed with COVID on 03/08/2023 which was coincidentally when her lower extremity pain worsened.  States currently her lower extremities are not bothering her.  She denies any prior history of PE or DVT.  Seen by pulmonologist.  Ordered D-dimer which was elevated, subsequently got CT scan this morning which showed bilateral PE.  HPI     Home Medications Prior to Admission medications   Medication Sig Start Date End Date Taking? Authorizing Provider  albuterol (VENTOLIN HFA) 108 (90 Base) MCG/ACT inhaler Inhale 2 puffs into the lungs every 6 (six) hours as needed for wheezing or shortness of breath. 03/02/22  Yes Coralyn Helling, MD  aspirin EC 81 MG tablet Take 81 mg by mouth daily. Swallow whole.   Yes [provider]  azelastine  (ASTELIN) 0.1 % nasal spray Place 1 spray into both nostrils 2 (two) times daily. Use in each nostril as directed 10/24/21  Yes Coralyn Helling, MD  budesonide-formoterol (SYMBICORT) 80-4.5 MCG/ACT inhaler Inhale 1 puff into the lungs 2 (two) times daily. 04/09/22  Yes Coralyn Helling, MD  Cholecalciferol (DIALYVITE VITAMIN D 5000) 125 MCG (5000 UT) capsule Take 5,000 Units by mouth daily.   Yes [provider]  cloBAZam (ONFI) 10 MG tablet Take 1 tablet (10 mg total) by mouth 2 (two) times daily. 01/21/23  Yes Anson Fret, MD  diltiazem (CARDIZEM CD) 120 MG 24 hr capsule Take 1 capsule (120 mg total) by mouth daily. 11/22/22  Yes Duke Salvia, MD  doxycycline (VIBRA-TABS) 100 MG tablet Take 1 tablet (100 mg total) by mouth 2 (two) times daily. 03/29/23  Yes Kalman Shan, MD  empagliflozin (JARDIANCE) 10 MG TABS tablet Take 1 tablet (10 mg total) by mouth daily. 11/20/22  Yes Duke Salvia, MD  Evolocumab (REPATHA SURECLICK) 140 MG/ML SOAJ Inject 140 mg into the skin every 14 (fourteen) days. 09/10/22  Yes Corky Crafts, MD  famotidine (PEPCID) 40 MG tablet Take 1 tablet (40 mg total) by mouth at bedtime. NEEDS APPT FOR FURTHER REFILLS 02/06/23  Yes Pyrtle, Carie Caddy, MD  fexofenadine (ALLEGRA) 180 MG tablet Take 180 mg by mouth daily as needed for allergies (alternates with zyrtec sometimes).   Yes [provider]  fluticasone (FLONASE) 50 MCG/ACT  nasal spray Place 2 sprays into both nostrils daily as needed for allergies or rhinitis. 09/15/21  Yes Zonia Kief, Amy J, NP  FOLIC ACID PO Take 1 tablet by mouth daily.   Yes [provider]  furosemide (LASIX) 20 MG tablet TAKE 2 TABLETS (40 MG) 3 DAYS PER WEEK AND TAKE 1 TABLET (20 MG) THE OTHER DAYS OF THE WEEK Patient taking differently: Take 20 mg by mouth daily as needed. PER PT TAKES PRN 05/14/22  Yes Corky Crafts, MD  hydroquinone 4 % cream Apply 1 Application topically at bedtime. 03/29/23  Yes [provider]   hydroxypropyl methylcellulose / hypromellose (ISOPTO TEARS / GONIOVISC) 2.5 % ophthalmic solution Place 1 drop into both eyes at bedtime.   Yes [provider]  latanoprost (XALATAN) 0.005 % ophthalmic solution Place 1 drop into both eyes at bedtime.   Yes [provider]  levocetirizine (XYZAL) 5 MG tablet Take 5 mg by mouth daily at 12 noon.   Yes [provider]  levothyroxine (SYNTHROID) 50 MCG tablet TAKE 1 TABLET EVERY DAY 03/08/23  Yes Georganna Skeans, MD  magnesium oxide (MAG-OX) 400 (240 Mg) MG tablet TAKE 1 TABLET TWICE DAILY 02/11/23  Yes Sheilah Pigeon, PA-C  montelukast (SINGULAIR) 10 MG tablet TAKE 1 TABLET AT BEDTIME 07/11/22  Yes Coralyn Helling, MD  Multiple Vitamins-Minerals (MULTIVITAMIN PO) Take 1 tablet by mouth daily.   Yes [provider]  pantoprazole (PROTONIX) 40 MG tablet TAKE 1 TABLET EVERY DAY 11/22/22  Yes Pyrtle, Carie Caddy, MD  polyethylene glycol powder (GLYCOLAX/MIRALAX) powder Take 17 grams by mouth daily 02/27/16  Yes Pyrtle, Carie Caddy, MD  potassium chloride (KLOR-CON M) 10 MEQ tablet TAKE 1/2 TABLET EVERY DAY (CALL OFFICE TO SCHEDULE YEARLY APPT) 09/10/22  Yes Corky Crafts, MD  predniSONE (DELTASONE) 10 MG tablet Take 4 tablets (40 mg total) by mouth daily with breakfast for 2 days, THEN 2 tablets (20 mg total) daily with breakfast for 2 days, THEN 1 tablet (10 mg total) daily with breakfast for 2 days, THEN 0.5 tablets (5 mg total) daily with breakfast for 2 days. 03/29/23 04/06/23 Yes Kalman Shan, MD  promethazine-dextromethorphan (PROMETHAZINE-DM) 6.25-15 MG/5ML syrup Take 5 mLs by mouth 4 (four) times daily as needed for cough. 02/19/23  Yes Glenford Bayley, NP  sodium chloride HYPERTONIC 3 % nebulizer solution Take by nebulization 2 (two) times daily as needed for cough. 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 08/05/19  Yes Coralyn Helling, MD  tiZANidine (ZANAFLEX) 4 MG tablet Take 0.5-1 tablets (2-4 mg total) by mouth 2 (two) times daily. 03/04/23   Yes Lovorn, Aundra Millet, MD  traZODone (DESYREL) 100 MG tablet TAKE 1 TO 1 AND 1/2 TABLETS AT BEDTIME 12/12/22  Yes Lovorn, Megan, MD  benzonatate (TESSALON) 200 MG capsule Take 1 capsule (200 mg total) by mouth 3 (three) times daily as needed for cough. 04/02/23   Kalman Shan, MD  Respiratory Therapy Supplies (FLUTTER) DEVI 1 Device by Does not apply route as needed. 09/22/18   Ivonne Andrew, NP      Allergies    Ace inhibitors, Amitriptyline hcl, Atorvastatin, Dilantin [phenytoin sodium extended], Duloxetine hcl, Paroxetine, Ramipril, Rosuvastatin, Carbamazepine, Codeine, Phenytoin sodium extended, Simvastatin, and Wellbutrin [bupropion]    Review of Systems   Review of Systems  Constitutional: Negative.   HENT: Negative.    Respiratory:  Positive for cough, chest tightness and shortness of breath. Negative for apnea, choking, wheezing and stridor.   Cardiovascular:  Positive for chest pain.  Negative for palpitations and leg swelling.  Gastrointestinal: Negative.   Genitourinary: Negative.   Musculoskeletal: Negative.   Skin: Negative.   Neurological: Negative.   All other systems reviewed and are negative.   Physical Exam Updated Vital Signs BP (!) 141/66   Pulse 63   Temp 97.9 F (36.6 C)   Resp 19   Ht 5' 6.5" (1.689 m)   Wt 98.9 kg   LMP  (LMP Unknown)   SpO2 100%   BMI 34.66 kg/m  Physical Exam Vitals and nursing note reviewed.  Constitutional:      General: She is not in acute distress.    Appearance: She is well-developed. She is not ill-appearing, toxic-appearing or diaphoretic.  HENT:     Head: Normocephalic and atraumatic.     Nose: Nose normal.     Mouth/Throat:     Mouth: Mucous membranes are moist.  Eyes:     Pupils: Pupils are equal, round, and reactive to light.  Cardiovascular:     Rate and Rhythm: Normal rate.     Pulses: Normal pulses.          Radial pulses are 2+ on the right side and 2+ on the left side.       Dorsalis pedis pulses are 2+ on  the right side and 2+ on the left side.     Heart sounds: Normal heart sounds.  Pulmonary:     Effort: Pulmonary effort is normal. No respiratory distress.     Breath sounds: Normal breath sounds.     Comments: Clear bilaterally, speaks in full sentences without difficulty Abdominal:     General: Bowel sounds are normal. There is no distension.     Palpations: Abdomen is soft.     Tenderness: There is no abdominal tenderness. There is no right CVA tenderness, left CVA tenderness or guarding.     Comments: Soft, nontender  Musculoskeletal:        General: Normal range of motion.     Cervical back: Normal range of motion.     Comments: Soft, nontender, no lower extremity edema.  Full range of motion without difficulty  Skin:    General: Skin is warm and dry.     Capillary Refill: Capillary refill takes less than 2 seconds.     Comments: No obvious rashes or lesions  Neurological:     General: No focal deficit present.     Mental Status: She is alert and oriented to person, place, and time.  Psychiatric:        Mood and Affect: Mood normal.     ED Results / Procedures / Treatments   Labs (all labs ordered are listed, but only abnormal results are displayed) Labs Reviewed  CBC WITH DIFFERENTIAL/PLATELET - Abnormal; Notable for the following components:      Result Value   Lymphs Abs 0.3 (*)    All other components within normal limits  COMPREHENSIVE METABOLIC PANEL - Abnormal; Notable for the following components:   Glucose, Bld 132 (*)    All other components within normal limits  D-DIMER, QUANTITATIVE - Abnormal; Notable for the following components:   D-Dimer, Quant 6.22 (*)    All other components within normal limits  BRAIN NATRIURETIC PEPTIDE  BASIC METABOLIC PANEL  HEPARIN LEVEL (UNFRACTIONATED)  CBC  TROPONIN I (HIGH SENSITIVITY)  TROPONIN I (HIGH SENSITIVITY)    EKG None  Radiology VAS [place] LOWER EXTREMITY VENOUS (DVT) (ONLY [facility] & [facility])  Result Date: 04/02/2023  Lower Venous DVT Study Patient Name:  RAYSHAWNA ALTES  Date of Exam:   04/02/2023 Medical Rec #: [id number]          Accession #:    [id number] Date of Birth: 12/12/1950          Patient Gender: F Patient Age:   55 years Exam Location:  [facility] Procedure:      VAS [place] LOWER EXTREMITY VENOUS (DVT) Referring Phys:   --------------------------------------------------------------------------------  Indications: Pulmonary embolism.  Risk Factors: Recent COVID infection with decreased activity. Limitations: Poor ultrasound/tissue interface. Comparison Study: Previous exams on 08/30/21 and 06/04/21 were negative for DVT Performing Technologist: Ernestene Mention RVT, RDMS  Examination Guidelines: A complete evaluation includes B-mode imaging, spectral Doppler, color Doppler, and power Doppler as needed of all accessible portions of each vessel. Bilateral testing is considered an integral part of a complete examination. Limited examinations for reoccurring indications may be performed as noted. The reflux portion of the exam is performed with the patient in reverse Trendelenburg.  +---------+---------------+---------+-----------+----------+-------------------+ RIGHT    CompressibilityPhasicitySpontaneityPropertiesThrombus Aging      +---------+---------------+---------+-----------+----------+-------------------+ CFV      Full           Yes      Yes                                      +---------+---------------+---------+-----------+----------+-------------------+ SFJ      Full                                                             +---------+---------------+---------+-----------+----------+-------------------+ FV Prox  Full           Yes      Yes                                      +---------+---------------+---------+-----------+----------+-------------------+ FV Mid   Full           Yes      Yes                                       +---------+---------------+---------+-----------+----------+-------------------+ FV DistalFull           Yes      Yes                                      +---------+---------------+---------+-----------+----------+-------------------+ PFV      Full                                                             +---------+---------------+---------+-----------+----------+-------------------+ POP      Full           Yes      Yes                                      +---------+---------------+---------+-----------+----------+-------------------+  PTV                                                   Not well visualized +---------+---------------+---------+-----------+----------+-------------------+ PERO                                                  Not well visualized +---------+---------------+---------+-----------+----------+-------------------+   +---------+---------------+---------+-----------+----------+--------------+ LEFT     CompressibilityPhasicitySpontaneityPropertiesThrombus Aging +---------+---------------+---------+-----------+----------+--------------+ CFV      Full           Yes      Yes                                 +---------+---------------+---------+-----------+----------+--------------+ SFJ      Full                                                        +---------+---------------+---------+-----------+----------+--------------+ FV Prox  Full           Yes      Yes                                 +---------+---------------+---------+-----------+----------+--------------+ FV Mid   Partial        No       No                   Acute          +---------+---------------+---------+-----------+----------+--------------+ FV DistalNone           No       No                   Acute          +---------+---------------+---------+-----------+----------+--------------+ PFV      Full                                                         +---------+---------------+---------+-----------+----------+--------------+ POP      None           No       No                   Acute          +---------+---------------+---------+-----------+----------+--------------+ PTV      Full                                                        +---------+---------------+---------+-----------+----------+--------------+ PERO     Full                                                        +---------+---------------+---------+-----------+----------+--------------+  Summary: BILATERAL: -No evidence of popliteal cyst, bilaterally. RIGHT: - There is no evidence of deep vein thrombosis in the lower extremity.  LEFT: - Findings consistent with acute deep vein thrombosis involving the left femoral vein, and left popliteal vein.  *See table(s) above for measurements and observations.    Preliminary    CT Angio Chest W/Cm &/Or Wo Cm  Result Date: 04/02/2023 CLINICAL DATA:  Dyspnea.  Upper back pain.  Bronchitis. EXAM: CT ANGIOGRAPHY CHEST WITH CONTRAST TECHNIQUE: Multidetector CT imaging of the chest was performed using the standard protocol during bolus administration of intravenous contrast. Multiplanar CT image reconstructions and MIPs were obtained to evaluate the vascular anatomy. RADIATION DOSE REDUCTION: This exam was performed according to the departmental dose-optimization program which includes automated exposure control, adjustment of the mA and/or kV according to patient size and/or use of iterative reconstruction technique. CONTRAST:  75mL OMNIPAQUE IOHEXOL 350 MG/ML SOLN COMPARISON:  X-ray 03/12/2023 prior CT scans including 07/06/2022 and older FINDINGS: Cardiovascular: Heart is slightly enlarged. No pericardial effusion. Thoracic aorta has a normal course and caliber with scattered atherosclerotic calcified plaque. There is a left upper chest battery pack for defibrillator with leads extending along the right side of the heart  pulmonary emboli are identified. There are areas of thrombus along the central right main pulmonary artery at the hilum as well as extending into the lobar and segmental branches in the right lung. There is some segmental and subsegmental emboli as well in the left upper lobe. There is some breathing motion which limits evaluation of small and peripheral emboli RV to LV ratio 1.02. No reflux of contrast into the intrahepatic IVC slight enlargement of the main pulmonary artery. Please correlate with clinical evidence of right heart strain. Mediastinum/Nodes: No specific abnormal lymph node enlargement identified in the axillary region, hilum or mediastinum. Slightly patulous thoracic esophagus small thyroid gland. Lungs/Pleura: No consolidation, pneumothorax or effusion. As described previously there are areas of interstitial septal thickening, bronchiectasis scattered in both lungs greatest in the lower lung zones. There is slightly more confluent ground-glass along the lung bases today compared to the previous but this could be related to the motion and level of inflation. Upper Abdomen: Adrenal glands are preserved in the upper abdomen. Colonic diverticula. Musculoskeletal: Diffuse degenerative changes along the spine with bridging osteophytes and syndesmophytes diffusely Review of the MIP images confirms the above findings. Critical Value/emergent results were called by telephone at the time of interpretation on 04/02/2023 at 7:26 am to provider [facility] , who verbally acknowledged these results. IMPRESSION: Bilateral pulmonary emboli, right-greater-than-left. Some central emboli on the right. Overall moderate clot burden. Enlarged heart with some enlargement of the pulmonary artery but this has seen previously. Please correlate for any clinical evidence of right heart strain. Defibrillator. Known interstitial lung disease. Aortic Atherosclerosis (ICD10-I70.0) and Emphysema (ICD10-J43.9). Electronically  Signed   By: Karen Kays M.D.   On: 04/02/2023 10:55    Procedures .Critical Care  Performed by: Linwood Dibbles, PA-C Authorized by: Linwood Dibbles, PA-C   Critical care provider statement:    Critical care time (minutes):  35   Critical care was necessary to treat or prevent imminent or life-threatening deterioration of the following conditions:  Cardiac failure, circulatory failure and respiratory failure   Critical care was time spent personally by me on the following activities:  Development of treatment plan with patient or surrogate, discussions with consultants, evaluation of patient's response to treatment, examination of patient, ordering and review  of laboratory studies, ordering and review of radiographic studies, ordering and performing treatments and interventions, pulse oximetry, re-evaluation of patient's condition and review of old charts     Medications Ordered in ED Medications  heparin ADULT infusion 100 units/mL (25000 units/222mL) (1,400 Units/hr Intravenous New Bag/Given 04/02/23 1740)  aspirin EC tablet 81 mg (has no administration in time range)  diltiazem (CARDIZEM CD) 24 hr capsule 120 mg (has no administration in time range)  traZODone (DESYREL) tablet 100 mg (has no administration in time range)  levothyroxine (SYNTHROID) tablet 50 mcg (has no administration in time range)  predniSONE (DELTASONE) tablet 10 mg (has no administration in time range)    Followed by  predniSONE (DELTASONE) tablet 5 mg (has no administration in time range)  pantoprazole (PROTONIX) EC tablet 40 mg (has no administration in time range)  mometasone-formoterol (DULERA) 100-5 MCG/ACT inhaler 2 puff (has no administration in time range)  fluticasone (FLONASE) 50 MCG/ACT nasal spray 2 spray (has no administration in time range)  montelukast (SINGULAIR) tablet 10 mg (has no administration in time range)  benzonatate (TESSALON) capsule 200 mg (has no administration in time range)   azelastine (ASTELIN) 0.1 % nasal spray 1 spray (has no administration in time range)  acetaminophen (TYLENOL) tablet 650 mg (has no administration in time range)    Or  acetaminophen (TYLENOL) suppository 650 mg (has no administration in time range)  ondansetron (ZOFRAN) tablet 4 mg (has no administration in time range)    Or  ondansetron (ZOFRAN) injection 4 mg (has no administration in time range)  albuterol (PROVENTIL) (2.5 MG/3ML) 0.083% nebulizer solution 2.5 mg (has no administration in time range)  loratadine (CLARITIN) tablet 10 mg (has no administration in time range)  heparin bolus via infusion 5,000 Units (5,000 Units Intravenous Bolus from Bag 04/02/23 1739)    ED Course/ Medical Decision Making/ A&P   72 year old history of pulmonary fibrosis here for evaluation of abnormal CT.  Developed central and right lateral chest pain on Friday, worse with deep breathing.  Subsequently seen by her pulmonologist who ordered a CT scan which showed bilateral PE.  Patient was diagnosed with COVID just under 1 month ago.  States she has been dealing with bilateral lower extremity pain to her bilateral calves which when seen by PCP was told it was due to her statin.  She denies any prior history of PE or DVT.  No recent surgery, immobilization or malignancy recently.  Does have remote history of lumpectomy for breast cancer.  She placed herself on 2 L via nasal cannula of oxygen at home which she had from a prior COVID diagnosis however prior to Friday had not had to use supplemental oxygen at home since 2022.  Her heart and lungs are clear.  Abdomen is soft, nontender.  She has a nonfocal neuroexam without deficits.    Labs and imaging personally viewed and interpreted:  CBC without leukocytosis   metabolic panel glucose 132 BNP 63 D-dimer 6.22 Troponin 7 Ultrasound positive for DVT left lower extremity at [facility] and [facility]   Discussed labs and imaging with patient.  She does have a history of brain  aneurysm in 2001 and GI bleed in 2000, has not any complications or bleeding since.  No recent falls or injuries.  No absolute contraindications to heparin.  We discussed risk versus benefit.  Will proceed with anticoagulation for bilateral PE.  She denies any need for pain management.  Will admit to hospital service continue management  Consult hospitalist  Dr. Erenest Blank who is agreeable to evaluate patient for admission  The patient appears reasonably stabilized for admission considering the current resources, flow, and capabilities available in the ED at this time, and I doubt any other [facility] requiring further screening and/or treatment in the ED prior to admission.                                  Medical Decision Making Amount and/or Complexity of Data Reviewed External Data Reviewed: labs, radiology, ECG and notes. Labs: ordered. Decision-making details documented in ED Course. Radiology: ordered and independent interpretation performed. Decision-making details documented in ED Course. ECG/medicine tests: ordered and independent interpretation performed. Decision-making details documented in ED Course.  Risk OTC drugs. Prescription drug management. Parenteral controlled substances. Decision regarding hospitalization. Diagnosis or treatment significantly limited by social determinants of health.           Final Clinical Impression(s) / ED Diagnoses Final diagnoses:  Multiple subsegmental pulmonary emboli without acute cor pulmonale (HCC)  Acute respiratory failure with hypoxia (HCC)  History of pulmonary fibrosis    Rx / DC Orders ED Discharge Orders     None         ,  A, PA-C 04/02/23 1823    Terald Sleeper, MD 04/03/23 1715

## 2023-04-02 NOTE — Telephone Encounter (Signed)
Critical Lab Results  Ref# [id number]

## 2023-04-02 NOTE — Progress Notes (Signed)
ANTICOAGULATION CONSULT NOTE   Pharmacy Consult for IV heparin Indication: pulmonary embolus  Allergies  Allergen Reactions   Ace Inhibitors Swelling    Angioedema; makes tongue "break out"    Amitriptyline Hcl Other (See Comments)     makes her too sleepy!   Atorvastatin Other (See Comments)    SEVERE MYALGIA   Dilantin [Phenytoin Sodium Extended] Rash    Severe rash   Duloxetine Hcl Nausea Only and Other (See Comments)    Sleepiness/ sick  Other Reaction(s): GI Intolerance   Paroxetine Nausea Only    Rapid heartbeat   Ramipril Other (See Comments)    TONGUE ULCERS    Rosuvastatin Other (See Comments)    SEVERE MYALGIA   Carbamazepine Rash   Codeine Itching   Phenytoin Sodium Extended Rash   Simvastatin Other (See Comments)    Increase in CK, myalgias   Wellbutrin [Bupropion] Palpitations    Patient Measurements: Height: 5' 6.5" (168.9 cm) Weight: 98.9 kg (218 lb) IBW/kg (Calculated) : 60.45 Heparin Dosing Weight: 82.6 kg   Vital Signs: Temp: 98 F (36.7 C) (08/13 1404) Temp Source: Oral (08/13 1404) BP: 153/80 (08/13 1404) Pulse Rate: 84 (08/13 1404)  Labs: Recent Labs    04/02/23 1454  HGB 14.2  HCT 44.3  PLT 212  CREATININE 0.83  TROPONINIHS 7    Estimated Creatinine Clearance: 71.3 mL/min (by C-G formula based on SCr of 0.83 mg/dL).   Medications:  Scheduled:   lidocaine  1 mL Other Once   lidocaine  3 mL Other Once   triamcinolone acetonide  40 mg Intramuscular Once    Assessment: Pharmacy is consulted to start heparin drip on 72 yo female diagnosed with PE> Pt recently diagnosed with COVID-19. 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 No noted anticoagulation on PTA med list. Per notes, at pulm clinic, this pt second clot as had one previously in 2001.   CT angio of chest indicates Bilateral pulmonary emboli, right-greater-than-left. Some central emboli on the right. Overall moderate clot burden.  Today, 04/02/23  Hgb 14.2, plt  212 SCr 0.83 mg/dl, crcl 72 ml/min    Goal of Therapy:  Heparin level 0.3-0.7 units/ml Monitor platelets by anticoagulation protocol: Yes   Plan:  Heparin 5000 IV bolus followed by heparin at heparin 1400 units/hr  Obtain HL 8 hours after start of infusion Daily CBC while on heparin Monitor for signs and symptoms of bleeding   Adalberto Cole, PharmD, BCPS 04/02/2023 4:37 PM

## 2023-04-02 NOTE — ED Triage Notes (Signed)
Pt sent in by PCP for newly diagnosed pulmonary embolism today. Pt initially scanned due to SOB since Friday, on 2L [place] O2 same time frame. Denies chest pain.

## 2023-04-02 NOTE — ED Notes (Signed)
ED TO INPATIENT HANDOFF REPORT  Name/Age/Gender [name] 72 y.o. female  Code Status    Code Status Orders  (From admission, onward)           Start     Ordered   04/02/23 1739  Full code  Continuous       Question:  By:  Answer:  Consent: discussion documented in EHR   04/02/23 1738           Code Status History     Date Active Date Inactive Code Status Order ID Comments User Context   06/03/2021 1426 06/07/2021 2017 Full Code [id number]  Teddy Spike, DO Inpatient   05/22/2021 2232 05/23/2021 2021 Full Code [id number]  Guy Sandifer, [place] Inpatient   02/13/2016 1210 02/14/2016 2104 Full Code [id number]  Guy Sandifer, PA Inpatient       Home/SNF/Other Home  Chief Complaint Acute pulmonary embolism (HCC) [I26.99]  Level of Care/Admitting Diagnosis ED Disposition     ED Disposition  Admit   Condition  --   Comment  Hospital Area: [facility] [[id number]]  Level of Care: Progressive [102]  Admit to Progressive based on following criteria: RESPIRATORY PROBLEMS hypoxemic/hypercapnic respiratory failure that is responsive to NIPPV (BiPAP) or High Flow Nasal Cannula (6-80 lpm). Frequent assessment/intervention, no > Q2 hrs < Q4 hrs, to maintain oxygenation and pulmonary hygiene.  May place patient in observation at [facility] or [facility] if equivalent level of care is available:: Yes  Covid Evaluation: Asymptomatic - no recent exposure (last 10 days) testing not required  Diagnosis: Acute pulmonary embolism [facility]) [[id number]]  Admitting Physician: Maryln Gottron [[id number]]  Attending Physician: Kirby Crigler, MIR [email or web address] [[id number]]          Medical History 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   Bronchiectasis (HCC)    Cerebral aneurysm, nonruptured     had a clip put in   CHF (congestive heart failure) (HCC)    Clostridium difficile infection    Depressive disorder, not elsewhere classified    Diverticulosis of colon (without mention of hemorrhage)    Esophageal reflux    Fatty liver    Fibromyalgia    Gastritis    GERD (gastroesophageal reflux disease)    GI bleed 2004   Glaucoma    Hiatal hernia    History of COVID-19 05/04/2021   Hyperlipidemia    Hypertension    Hypothyroidism    Internal hemorrhoids    Joint pain    Multifocal pneumonia 06/03/2021   Obstructive sleep apnea (adult) (pediatric)    Osteoarthritis    Ostium secundum type atrial septal defect    Other chronic nonalcoholic liver disease    Other pulmonary embolism and infarction    Palpitations    Paroxysmal ventricular tachycardia (HCC)    Personal history of radiation therapy    PONV (postoperative nausea and vomiting)    Presence of permanent cardiac pacemaker    PUD (peptic ulcer disease)    Radiation 02/03/15-03/10/15   Right Breast   Sarcoid    per pt , not sure   Schatzki's ring    Shortness of breath    Sleep apnea    Stroke [facility]) 2013   tia/ pt feels it was around 2008  0r 2009   Takotsubo syndrome    Tubular adenoma of colon    Unspecified transient cerebral ischemia    Unspecified vitamin D deficiency     Allergies Allergies  Allergen Reactions   Ace Inhibitors Swelling    Angioedema; makes tongue "break out"    Amitriptyline Hcl Other (See Comments)     makes her too sleepy!   Atorvastatin Other (See Comments)    SEVERE MYALGIA   Dilantin [Phenytoin Sodium Extended] Rash    Severe rash   Duloxetine Hcl Nausea Only and Other (See Comments)    Sleepiness/ sick  Other Reaction(s): GI Intolerance   Paroxetine Nausea Only    Rapid heartbeat   Ramipril Other (See Comments)    TONGUE ULCERS    Rosuvastatin Other (See Comments)    SEVERE MYALGIA   Carbamazepine Rash   Codeine Itching   Phenytoin Sodium Extended Rash    Simvastatin Other (See Comments)    Increase in CK, myalgias   Wellbutrin [Bupropion] Palpitations    IV Location/Drains/Wounds Patient Lines/Drains/Airways Status     Active Line/Drains/Airways     Name Placement date Placement time Site Days   Peripheral IV 04/02/23 20 G Left;Posterior Hand 04/02/23  1659  Hand  less than 1   Airway 05/22/21  0900  -- 680            Labs/Imaging Results for orders placed or performed during the hospital encounter of 04/02/23 (from the past 48 hour(s))  CBC with Differential     Status: Abnormal   Collection Time: 04/02/23  2:54 PM  Result Value Ref Range   WBC 6.3 4.0 - 10.5 K/uL   RBC 4.63 3.87 - 5.11 MIL/uL   Hemoglobin 14.2 12.0 - 15.0 g/dL   HCT 56.4 33.2 - 95.1 %   MCV 95.7 80.0 - 100.0 fL   MCH 30.7 26.0 - 34.0 pg   MCHC 32.1 30.0 - 36.0 g/dL   RDW 88.4 16.6 - 06.3 %   Platelets 212 150 - 400 K/uL   nRBC 0.0 0.0 - 0.2 %   Neutrophils Relative % 92 %   Neutro Abs 5.8 1.7 - 7.7 K/uL   Lymphocytes Relative 5 %   Lymphs Abs 0.3 (L) 0.7 - 4.0 K/uL   Monocytes Relative 2 %   Monocytes Absolute 0.2 0.1 - 1.0 K/uL   Eosinophils Relative 0 %   Eosinophils Absolute 0.0 0.0 - 0.5 K/uL   Basophils Relative 0 %   Basophils Absolute 0.0 0.0 - 0.1 K/uL   Immature Granulocytes 1 %   Abs Immature Granulocytes 0.03 0.00 - 0.07 K/uL    Comment: Performed at [facility], [name], [place], [place] [address]  Comprehensive metabolic panel     Status: Abnormal   Collection Time: 04/02/23  2:54 PM  Result Value Ref Range   Sodium 137 135 - 145 mmol/L   Potassium 4.5 3.5 - 5.1 mmol/L   Chloride 105 98 - 111 mmol/L   CO2 26 22 - 32 mmol/L   Glucose, Bld 132 (H) 70 - 99 mg/dL    Comment: Glucose reference range applies only to samples taken after fasting for at least 8 hours.   BUN 12 8 - 23 mg/dL   Creatinine, Ser 0.93 0.44 - 1.00 mg/dL   Calcium 9.5 8.9 - 23.5 mg/dL   Total Protein 7.5 6.5 - 8.1 g/dL   Albumin  3.7 3.5 - 5.0 g/dL   AST 27 15 -  41 U/L   ALT 35 0 - 44 U/L   Alkaline Phosphatase 109 38 - 126 U/L   Total Bilirubin 0.5 0.3 - 1.2 mg/dL   GFR, Estimated >16 >10 mL/min    Comment: (NOTE) Calculated using the CKD-EPI Creatinine Equation (2021)    Anion gap 6 5 - 15    Comment: Performed at [facility], [address] [place], [place] [address]  D-dimer, quantitative     Status: Abnormal   Collection Time: 04/02/23  2:54 PM  Result Value Ref Range   D-Dimer, Quant 6.22 (H) 0.00 - 0.50 ug/mL-FEU    Comment: (NOTE) At the manufacturer cut-off value of 0.5 g/mL FEU, this assay has a negative predictive value of 95-100%.This assay is intended for use in conjunction with a clinical pretest probability (PTP) assessment model to exclude pulmonary embolism (PE) and deep venous thrombosis (DVT) in outpatients suspected of PE or DVT. Results should be correlated with clinical presentation. Performed at [facility], [address] [place], [place] [address]   Troponin I (High Sensitivity)     Status: None   Collection Time: 04/02/23  2:54 PM  Result Value Ref Range   Troponin I (High Sensitivity) 7 <18 ng/L    Comment: (NOTE) Elevated high sensitivity troponin I (hsTnI) values and significant  changes across serial measurements may suggest ACS but many other  chronic and acute conditions are known to elevate hsTnI results.  Refer to the "Links" section for chest pain algorithms and additional  guidance. Performed at [facility], [address] [place], [place] [address]   Brain natriuretic peptide     Status: None   Collection Time: 04/02/23  2:54 PM  Result Value Ref Range   B Natriuretic Peptide 63.8 0.0 - 100.0 pg/mL    Comment: Performed at [facility], [address] [place], [place] [address]  Troponin I (High Sensitivity)     Status: None   Collection Time: 04/02/23  4:50 PM  Result Value Ref  Range   Troponin I (High Sensitivity) 8 <18 ng/L    Comment: (NOTE) Elevated high sensitivity troponin I (hsTnI) values and significant  changes across serial measurements may suggest ACS but many other  chronic and acute conditions are known to elevate hsTnI results.  Refer to the "Links" section for chest pain algorithms and additional  guidance. Performed at [facility], [address], [place], [place] [address]    *Note: Due to a large number of results and/or encounters for the requested time period, some results have not been displayed. A complete set of results can be found in Results Review.   VAS [place] LOWER EXTREMITY VENOUS (DVT) (ONLY [facility] & [facility])  Result Date: 04/02/2023  Lower Venous DVT Study Patient Name:  [name]  Date of Exam:   04/02/2023 Medical Rec #: [id number]          Accession #:    [id number] Date of Birth: Dec 06, 1950          Patient Gender: F Patient Age:   42 years Exam Location:  [facility] Procedure:      VAS [place] LOWER EXTREMITY VENOUS (DVT) Referring Phys: BRITNI HENDERLY --------------------------------------------------------------------------------  Indications: Pulmonary embolism.  Risk Factors: Recent COVID infection with decreased activity. Limitations: Poor ultrasound/tissue interface. Comparison Study: Previous exams on 08/30/21 and 06/04/21 were negative for DVT Performing Technologist: Ernestene Mention RVT, RDMS  Examination Guidelines: A complete evaluation includes  B-mode imaging, spectral Doppler, color Doppler, and power Doppler as needed of all accessible portions of each vessel. Bilateral testing is considered an integral part of a complete examination. Limited examinations for reoccurring indications may be performed as noted. The reflux portion of the exam is performed with the patient in reverse Trendelenburg.  +---------+---------------+---------+-----------+----------+-------------------+ RIGHT     CompressibilityPhasicitySpontaneityPropertiesThrombus Aging      +---------+---------------+---------+-----------+----------+-------------------+ CFV      Full           Yes      Yes                                      +---------+---------------+---------+-----------+----------+-------------------+ SFJ      Full                                                             +---------+---------------+---------+-----------+----------+-------------------+ FV Prox  Full           Yes      Yes                                      +---------+---------------+---------+-----------+----------+-------------------+ FV Mid   Full           Yes      Yes                                      +---------+---------------+---------+-----------+----------+-------------------+ FV DistalFull           Yes      Yes                                      +---------+---------------+---------+-----------+----------+-------------------+ PFV      Full                                                             +---------+---------------+---------+-----------+----------+-------------------+ POP      Full           Yes      Yes                                      +---------+---------------+---------+-----------+----------+-------------------+ PTV                                                   Not well visualized +---------+---------------+---------+-----------+----------+-------------------+ PERO  Not well visualized +---------+---------------+---------+-----------+----------+-------------------+   +---------+---------------+---------+-----------+----------+--------------+ LEFT     CompressibilityPhasicitySpontaneityPropertiesThrombus Aging +---------+---------------+---------+-----------+----------+--------------+ CFV      Full           Yes      Yes                                  +---------+---------------+---------+-----------+----------+--------------+ SFJ      Full                                                        +---------+---------------+---------+-----------+----------+--------------+ FV Prox  Full           Yes      Yes                                 +---------+---------------+---------+-----------+----------+--------------+ FV Mid   Partial        No       No                   Acute          +---------+---------------+---------+-----------+----------+--------------+ FV DistalNone           No       No                   Acute          +---------+---------------+---------+-----------+----------+--------------+ PFV      Full                                                        +---------+---------------+---------+-----------+----------+--------------+ POP      None           No       No                   Acute          +---------+---------------+---------+-----------+----------+--------------+ PTV      Full                                                        +---------+---------------+---------+-----------+----------+--------------+ PERO     Full                                                        +---------+---------------+---------+-----------+----------+--------------+    Summary: BILATERAL: -No evidence of popliteal cyst, bilaterally. RIGHT: - There is no evidence of deep vein thrombosis in the lower extremity.  LEFT: - Findings consistent with acute deep vein thrombosis involving the left femoral vein, and left popliteal vein.  *See table(s) above for measurements and observations.    Preliminary    CT Angio Chest W/Cm &/Or Wo Cm  Result Date: 04/02/2023  CLINICAL DATA:  Dyspnea.  Upper back pain.  Bronchitis. EXAM: CT ANGIOGRAPHY CHEST WITH CONTRAST TECHNIQUE: Multidetector CT imaging of the chest was performed using the standard protocol during bolus administration of intravenous contrast. Multiplanar CT  image reconstructions and MIPs were obtained to evaluate the vascular anatomy. RADIATION DOSE REDUCTION: This exam was performed according to the departmental dose-optimization program which includes automated exposure control, adjustment of the mA and/or kV according to patient size and/or use of iterative reconstruction technique. CONTRAST:  75mL OMNIPAQUE IOHEXOL 350 MG/ML SOLN COMPARISON:  X-ray 03/12/2023 prior CT scans including 07/06/2022 and older FINDINGS: Cardiovascular: Heart is slightly enlarged. No pericardial effusion. Thoracic aorta has a normal course and caliber with scattered atherosclerotic calcified plaque. There is a left upper chest battery pack for defibrillator with leads extending along the right side of the heart pulmonary emboli are identified. There are areas of thrombus along the central right main pulmonary artery at the hilum as well as extending into the lobar and segmental branches in the right lung. There is some segmental and subsegmental emboli as well in the left upper lobe. There is some breathing motion which limits evaluation of small and peripheral emboli RV to LV ratio 1.02. No reflux of contrast into the intrahepatic IVC slight enlargement of the main pulmonary artery. Please correlate with clinical evidence of right heart strain. Mediastinum/Nodes: No specific abnormal lymph node enlargement identified in the axillary region, hilum or mediastinum. Slightly patulous thoracic esophagus small thyroid gland. Lungs/Pleura: No consolidation, pneumothorax or effusion. As described previously there are areas of interstitial septal thickening, bronchiectasis scattered in both lungs greatest in the lower lung zones. There is slightly more confluent ground-glass along the lung bases today compared to the previous but this could be related to the motion and level of inflation. Upper Abdomen: Adrenal glands are preserved in the upper abdomen. Colonic diverticula. Musculoskeletal:  Diffuse degenerative changes along the spine with bridging osteophytes and syndesmophytes diffusely Review of the MIP images confirms the above findings. Critical Value/emergent results were called by telephone at the time of interpretation on 04/02/2023 at 7:26 am to provider [facility] , who verbally acknowledged these results. IMPRESSION: Bilateral pulmonary emboli, right-greater-than-left. Some central emboli on the right. Overall moderate clot burden. Enlarged heart with some enlargement of the pulmonary artery but this has seen previously. Please correlate for any clinical evidence of right heart strain. Defibrillator. Known interstitial lung disease. Aortic Atherosclerosis (ICD10-I70.0) and Emphysema (ICD10-J43.9). Electronically Signed   By: Karen Kays M.D.   On: 04/02/2023 10:55    Pending Labs Unresulted Labs (From admission, onward)     Start     Ordered   04/03/23 0500  Basic metabolic panel  Tomorrow morning,   R        04/02/23 1738   04/03/23 0500  CBC  Daily,   R      04/02/23 1807   04/03/23 0130  Heparin level (unfractionated)  Once,   R        04/02/23 1807            Vitals/Pain Today's Vitals   04/02/23 1404 04/02/23 1416 04/02/23 1707  BP: (!) 153/80  (!) 141/66  Pulse: 84  63  Resp: 16  19  Temp: 98 F (36.7 C)  97.9 F (36.6 C)  TempSrc: Oral    SpO2: 98%  100%  Weight:  98.9 kg   Height:  5' 6.5" (1.689 m)   PainSc:  0-No pain  Isolation Precautions No active isolations  Medications Medications  heparin ADULT infusion 100 units/mL (25000 units/233mL) (1,400 Units/hr Intravenous New Bag/Given 04/02/23 1740)  aspirin EC tablet 81 mg (has no administration in time range)  diltiazem (CARDIZEM CD) 24 hr capsule 120 mg (has no administration in time range)  traZODone (DESYREL) tablet 100 mg (has no administration in time range)  levothyroxine (SYNTHROID) tablet 50 mcg (has no administration in time range)  predniSONE (DELTASONE) tablet 10 mg  (has no administration in time range)    Followed by  predniSONE (DELTASONE) tablet 5 mg (has no administration in time range)  pantoprazole (PROTONIX) EC tablet 40 mg (has no administration in time range)  mometasone-formoterol (DULERA) 100-5 MCG/ACT inhaler 2 puff (has no administration in time range)  fluticasone (FLONASE) 50 MCG/ACT nasal spray 2 spray (has no administration in time range)  montelukast (SINGULAIR) tablet 10 mg (has no administration in time range)  benzonatate (TESSALON) capsule 200 mg (has no administration in time range)  azelastine (ASTELIN) 0.1 % nasal spray 1 spray (has no administration in time range)  acetaminophen (TYLENOL) tablet 650 mg (has no administration in time range)    Or  acetaminophen (TYLENOL) suppository 650 mg (has no administration in time range)  ondansetron (ZOFRAN) tablet 4 mg (has no administration in time range)    Or  ondansetron (ZOFRAN) injection 4 mg (has no administration in time range)  albuterol (PROVENTIL) (2.5 MG/3ML) 0.083% nebulizer solution 2.5 mg (has no administration in time range)  loratadine (CLARITIN) tablet 10 mg (has no administration in time range)  heparin bolus via infusion 5,000 Units (5,000 Units Intravenous Bolus from Bag 04/02/23 1739)    Mobility walks
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
## 2023-04-02 NOTE — Progress Notes (Signed)
BLE venous duplex has been completed.  Preliminary results given to Britni Henderly, PA-C.   Results can be found under chart review under CV PROC. 04/02/2023 5:09 PM  RVT, RDMS

## 2023-04-02 NOTE — Telephone Encounter (Signed)
Vicente Serene calling with call report. For CT scan. Vicente Serene phone number is [phone].

## 2023-04-03 ENCOUNTER — Encounter (HOSPITAL_COMMUNITY): Payer: Self-pay | Admitting: Internal Medicine

## 2023-04-03 ENCOUNTER — Observation Stay (HOSPITAL_COMMUNITY): Payer: Medicare Other

## 2023-04-03 ENCOUNTER — Ambulatory Visit: Payer: Medicare Other | Admitting: Family

## 2023-04-03 ENCOUNTER — Other Ambulatory Visit (HOSPITAL_COMMUNITY): Payer: Self-pay

## 2023-04-03 ENCOUNTER — Encounter: Payer: Self-pay | Admitting: Internal Medicine

## 2023-04-03 DIAGNOSIS — I428 Other cardiomyopathies: Secondary | ICD-10-CM | POA: Diagnosis present

## 2023-04-03 DIAGNOSIS — I2699 Other pulmonary embolism without acute cor pulmonale: Secondary | ICD-10-CM | POA: Diagnosis present

## 2023-04-03 DIAGNOSIS — M797 Fibromyalgia: Secondary | ICD-10-CM | POA: Diagnosis present

## 2023-04-03 DIAGNOSIS — I2609 Other pulmonary embolism with acute cor pulmonale: Secondary | ICD-10-CM | POA: Diagnosis not present

## 2023-04-03 DIAGNOSIS — Z923 Personal history of irradiation: Secondary | ICD-10-CM | POA: Diagnosis not present

## 2023-04-03 DIAGNOSIS — E785 Hyperlipidemia, unspecified: Secondary | ICD-10-CM | POA: Diagnosis present

## 2023-04-03 DIAGNOSIS — Z95810 Presence of automatic (implantable) cardiac defibrillator: Secondary | ICD-10-CM | POA: Diagnosis not present

## 2023-04-03 DIAGNOSIS — Z87891 Personal history of nicotine dependence: Secondary | ICD-10-CM | POA: Diagnosis not present

## 2023-04-03 DIAGNOSIS — I5032 Chronic diastolic (congestive) heart failure: Secondary | ICD-10-CM | POA: Diagnosis present

## 2023-04-03 DIAGNOSIS — R0602 Shortness of breath: Secondary | ICD-10-CM | POA: Diagnosis present

## 2023-04-03 DIAGNOSIS — I110 Hypertensive heart disease with heart failure: Secondary | ICD-10-CM | POA: Diagnosis present

## 2023-04-03 DIAGNOSIS — I2694 Multiple subsegmental pulmonary emboli without acute cor pulmonale: Secondary | ICD-10-CM | POA: Diagnosis present

## 2023-04-03 DIAGNOSIS — Z8249 Family history of ischemic heart disease and other diseases of the circulatory system: Secondary | ICD-10-CM | POA: Diagnosis not present

## 2023-04-03 DIAGNOSIS — J9601 Acute respiratory failure with hypoxia: Secondary | ICD-10-CM | POA: Diagnosis present

## 2023-04-03 DIAGNOSIS — Z7984 Long term (current) use of oral hypoglycemic drugs: Secondary | ICD-10-CM | POA: Diagnosis not present

## 2023-04-03 DIAGNOSIS — K760 Fatty (change of) liver, not elsewhere classified: Secondary | ICD-10-CM | POA: Diagnosis present

## 2023-04-03 DIAGNOSIS — I82432 Acute embolism and thrombosis of left popliteal vein: Secondary | ICD-10-CM | POA: Diagnosis present

## 2023-04-03 DIAGNOSIS — I2729 Other secondary pulmonary hypertension: Secondary | ICD-10-CM | POA: Diagnosis present

## 2023-04-03 DIAGNOSIS — I82412 Acute embolism and thrombosis of left femoral vein: Secondary | ICD-10-CM | POA: Diagnosis present

## 2023-04-03 DIAGNOSIS — Z8616 Personal history of COVID-19: Secondary | ICD-10-CM | POA: Diagnosis not present

## 2023-04-03 DIAGNOSIS — Z79890 Hormone replacement therapy (postmenopausal): Secondary | ICD-10-CM | POA: Diagnosis not present

## 2023-04-03 DIAGNOSIS — Z7902 Long term (current) use of antithrombotics/antiplatelets: Secondary | ICD-10-CM | POA: Diagnosis not present

## 2023-04-03 DIAGNOSIS — Z96653 Presence of artificial knee joint, bilateral: Secondary | ICD-10-CM | POA: Diagnosis present

## 2023-04-03 DIAGNOSIS — E039 Hypothyroidism, unspecified: Secondary | ICD-10-CM | POA: Diagnosis present

## 2023-04-03 DIAGNOSIS — I480 Paroxysmal atrial fibrillation: Secondary | ICD-10-CM | POA: Diagnosis present

## 2023-04-03 DIAGNOSIS — K219 Gastro-esophageal reflux disease without esophagitis: Secondary | ICD-10-CM | POA: Diagnosis present

## 2023-04-03 DIAGNOSIS — J8410 Pulmonary fibrosis, unspecified: Secondary | ICD-10-CM | POA: Diagnosis present

## 2023-04-03 LAB — HEPARIN LEVEL (UNFRACTIONATED): Heparin Unfractionated: >1.1 [IU]/mL — ABNORMAL HIGH (ref 0.30–0.70)

## 2023-04-03 MED ORDER — LATANOPROST 0.005 % OP SOLN
1.0000 [drp] | Freq: Every day | OPHTHALMIC | Status: DC
  Administered 2023-04-03: 1 [drp] via OPHTHALMIC
  Filled 2023-04-03: qty 2.5

## 2023-04-03 MED ORDER — ADULT MULTIVITAMIN W/MINERALS CH
1.0000 | ORAL_TABLET | Freq: Every day | ORAL | Status: DC
  Administered 2023-04-03 – 2023-04-04 (×2): 1 via ORAL
  Filled 2023-04-03 (×2): qty 1

## 2023-04-03 MED ORDER — ALBUTEROL SULFATE (2.5 MG/3ML) 0.083% IN NEBU: 3.0000 mL | INHALATION_SOLUTION | Freq: Four times a day (QID) | RESPIRATORY_TRACT | Status: DC | PRN

## 2023-04-03 MED ORDER — POLYVINYL ALCOHOL 1.4 % OP SOLN
1.0000 [drp] | Freq: Every day | OPHTHALMIC | Status: DC
  Administered 2023-04-03: 1 [drp] via OPHTHALMIC
  Filled 2023-04-03: qty 15

## 2023-04-03 MED ORDER — EMPAGLIFLOZIN 10 MG PO TABS
10.0000 mg | ORAL_TABLET | Freq: Every day | ORAL | Status: DC
  Administered 2023-04-03 – 2023-04-04 (×2): 10 mg via ORAL
  Filled 2023-04-03 (×2): qty 1

## 2023-04-03 MED ORDER — APIXABAN 5 MG PO TABS
10.0000 mg | ORAL_TABLET | ORAL | Status: AC
  Administered 2023-04-03: 10 mg via ORAL
  Filled 2023-04-03: qty 2

## 2023-04-03 MED ORDER — APIXABAN 5 MG PO TABS
10.0000 mg | ORAL_TABLET | Freq: Two times a day (BID) | ORAL | Status: DC
  Administered 2023-04-03: 10 mg via ORAL
  Filled 2023-04-03 (×2): qty 2

## 2023-04-03 MED ORDER — TIZANIDINE HCL 4 MG PO TABS: 2.0000 mg | ORAL_TABLET | Freq: Every day | ORAL | Status: DC

## 2023-04-03 MED ORDER — MAGNESIUM OXIDE -MG SUPPLEMENT 400 (240 MG) MG PO TABS
400.0000 mg | ORAL_TABLET | Freq: Two times a day (BID) | ORAL | Status: DC
  Administered 2023-04-03 – 2023-04-04 (×3): 400 mg via ORAL
  Filled 2023-04-03 (×3): qty 1

## 2023-04-03 MED ORDER — APIXABAN 5 MG PO TABS: 5.0000 mg | ORAL_TABLET | Freq: Two times a day (BID) | ORAL | Status: DC

## 2023-04-03 MED ORDER — VITAMIN D3 25 MCG (1000 UNIT) PO TABS
1000.0000 [IU] | ORAL_TABLET | Freq: Every day | ORAL | Status: DC
  Administered 2023-04-03 – 2023-04-04 (×2): 1000 [IU] via ORAL
  Filled 2023-04-03 (×2): qty 1

## 2023-04-03 MED ORDER — POTASSIUM CHLORIDE CRYS ER 10 MEQ PO TBCR
10.0000 meq | EXTENDED_RELEASE_TABLET | Freq: Every day | ORAL | Status: DC
  Administered 2023-04-03 – 2023-04-04 (×2): 10 meq via ORAL
  Filled 2023-04-03 (×2): qty 1

## 2023-04-03 MED ORDER — CLOBAZAM 10 MG PO TABS
10.0000 mg | ORAL_TABLET | Freq: Two times a day (BID) | ORAL | Status: DC
  Administered 2023-04-03 – 2023-04-04 (×3): 10 mg via ORAL
  Filled 2023-04-03 (×2): qty 1

## 2023-04-03 MED ORDER — TIZANIDINE HCL 4 MG PO TABS
2.0000 mg | ORAL_TABLET | Freq: Two times a day (BID) | ORAL | Status: DC
  Filled 2023-04-03: qty 1

## 2023-04-03 MED ORDER — FOLIC ACID 1 MG PO TABS
1.0000 mg | ORAL_TABLET | Freq: Every day | ORAL | Status: DC
  Administered 2023-04-03 – 2023-04-04 (×2): 1 mg via ORAL
  Filled 2023-04-03 (×2): qty 1

## 2023-04-03 MED ORDER — HYPROMELLOSE (GONIOSCOPIC) 2.5 % OP SOLN
1.0000 [drp] | Freq: Every day | OPHTHALMIC | Status: DC
  Filled 2023-04-03: qty 15

## 2023-04-03 NOTE — Progress Notes (Signed)
ANTICOAGULATION CONSULT NOTE   Pharmacy Consult for IV heparin Indication: pulmonary embolus  Allergies  Allergen Reactions   Ace Inhibitors Swelling    Angioedema; makes tongue "break out"    Amitriptyline Hcl Other (See Comments)     makes her too sleepy!   Atorvastatin Other (See Comments)    SEVERE MYALGIA   Dilantin [Phenytoin Sodium Extended] Rash    Severe rash   Duloxetine Hcl Nausea Only and Other (See Comments)    Sleepiness/ sick  Other Reaction(s): GI Intolerance   Paroxetine Nausea Only    Rapid heartbeat   Ramipril Other (See Comments)    TONGUE ULCERS    Rosuvastatin Other (See Comments)    SEVERE MYALGIA   Carbamazepine Rash   Codeine Itching   Phenytoin Sodium Extended Rash   Simvastatin Other (See Comments)    Increase in CK, myalgias   Wellbutrin [Bupropion] Palpitations    Patient Measurements: Height: 5' 6.5" (168.9 cm) Weight: 98.9 kg (218 lb) IBW/kg (Calculated) : 60.45 Heparin Dosing Weight: 82.6 kg   Vital Signs: Temp: 98 F (36.7 C) (08/14 0004) Temp Source: Oral (08/14 0004) BP: 132/68 (08/14 0004) Pulse Rate: 71 (08/14 0004)  Labs: Recent Labs    04/02/23 1454 04/02/23 1650 04/03/23 0337  HGB 14.2  --  12.7  HCT 44.3  --  39.5  PLT 212  --  199  HEPARINUNFRC  --   --  0.86*  CREATININE 0.83  --  0.71  TROPONINIHS 7 8  --     Estimated Creatinine Clearance: 73.9 mL/min (by C-G formula based on SCr of 0.71 mg/dL).   Medications:  Scheduled:   aspirin EC  81 mg Oral Daily   azelastine  1 spray Each Nare BID   diltiazem  120 mg Oral Daily   levothyroxine  50 mcg Oral Q0600   loratadine  10 mg Oral Q1200   mometasone-formoterol  2 puff Inhalation BID   montelukast  10 mg Oral QHS   pantoprazole  40 mg Oral Daily   predniSONE  10 mg Oral Q breakfast   Followed by   Melene Muller ON 04/04/2023] predniSONE  5 mg Oral Q breakfast    Assessment: Pharmacy is consulted to start heparin drip on 72 yo female diagnosed with PE> Pt  recently diagnosed with COVID-19. 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 No noted anticoagulation on PTA med list. Per notes, at pulm clinic, this pt second clot as had one previously in 2001.   CT angio of chest indicates Bilateral pulmonary emboli, right-greater-than-left. Some central emboli on the right. Overall moderate clot burden.  Today, 04/03/23  03:37 heparin level = 0.86 (supratherapeutic) with heparin gtt @ 1400 units/hr SCr = 0.71 CBC wnl No complications of therapy noted   Goal of Therapy:  Heparin level 0.3-0.7 units/ml Monitor platelets by anticoagulation protocol: Yes   Plan:  Decrease heparin gtt to 1250 units/hr  Obtain HL 8 hours after rate reduction Daily CBC while on heparin Monitor for signs and symptoms of bleeding Follow for transition to DOAC   Terrilee Files, PharmD 04/03/2023 4:19 AM

## 2023-04-03 NOTE — TOC Benefit Eligibility Note (Signed)
Patient Product/process development scientist completed.    The patient is insured through [employer]. Patient has Medicare and is not eligible for a copay card, but may be able to apply for patient assistance, if available.    Ran test claim for Eliquis 5 mg and the current 30 day co-pay is $139.76 due to being in Coverage Gap (donut hole).  Ran test claim for Xarelto 20 mg and the current 30 day co-pay is $133.93 due to being in Coverage Gap (donut hole).  Ran test claim for dabigatran (Pradaxa) 150 mg and the current 30 day co-pay is $40.79   Ran test claim for enoxaparin (Lovenox) 100 mg/ml and the current 10 day co-pay is $29.51    This test claim was processed through [employer]- copay amounts may vary at other pharmacies due to [employer], or as the patient moves through the different stages of their insurance plan.     Roland Earl, CPHT Pharmacy Patient Advocate Specialist [facility] Patient Advocate Team Direct Number: [phone]  Fax: [phone]

## 2023-04-03 NOTE — Progress Notes (Signed)
  Echocardiogram 2D Echocardiogram has been performed.  Maren Reamer 04/03/2023, 2:46 PM

## 2023-04-03 NOTE — Progress Notes (Signed)
SATURATION QUALIFICATIONS: (This note is used to comply with regulatory documentation for home oxygen)  Patient Saturations on Room Air at Rest = 95%  Patient Saturations on Room Air while Ambulating = 87%  Patient Saturations on 2 Liters of oxygen while Ambulating = 96%  Please briefly explain why patient needs home oxygen:  While walking on RA patients O2 saturations decreased to 87% and patient experiencing mild SOB.  When 2L O2 [place] applied, O2 saturations increased to 96%.

## 2023-04-03 NOTE — Progress Notes (Signed)
Mobility Specialist - Progress Note  Nurse requested Mobility Specialist to perform oxygen saturation test with pt which includes removing pt from oxygen both at rest and while ambulating.  Below are the results from that testing.     Patient Saturations on Room Air at Rest = spO2 95%  Patient Saturations on Room Air while Ambulating = sp02 87% .  Rested and performed pursed lip breathing for 1 minute with sp02 at 89%.  Patient Saturations on 2 Liters of oxygen while Ambulating = sp02 96%  At end of testing pt left in room on 2  Liters of oxygen.  Reported results to nurse.     04/03/23 1028  Oxygen Therapy  O2 Device Room Air;Nasal Cannula  O2 Flow Rate (L/min) 2 L/min  Patient Activity (if Appropriate) Ambulating  Mobility  Activity Ambulated independently in hallway  Level of Assistance Standby assist, set-up cues, supervision of patient - no hands on  Assistive Device None  Distance Ambulated (ft) 500 ft  Range of Motion/Exercises Active  Activity Response Tolerated well  Mobility Referral Yes  $Mobility charge 1 Mobility  Mobility Specialist Start Time (ACUTE ONLY) 1005  Mobility Specialist Stop Time (ACUTE ONLY) 1028  Mobility Specialist Time Calculation (min) (ACUTE ONLY) 23 min   Pt was found in bed and agreeable to ambulate. Stated feeling a little unsteady during session due to not being up much. At EOS was left in bathroom. Understood to pull call bell when needed. NT and RN notified.  Billey Chang Mobility Specialist

## 2023-04-03 NOTE — Progress Notes (Addendum)
ANTICOAGULATION CONSULT NOTE - Initial Consult  Pharmacy Consult for Eliquis Indication: pulmonary embolus  Patient Measurements: Height: 5' 6.5" (168.9 cm) Weight: 98 kg (216 lb 0.8 oz) IBW/kg (Calculated) : 60.45  Vital Signs: Temp: 97.7 F (36.5 C) (08/14 0830) Temp Source: Axillary (08/14 0830) BP: 123/74 (08/14 0830) Pulse Rate: 70 (08/14 0830)  Labs: Recent Labs    04/02/23 1454 04/02/23 1650 04/03/23 0337 04/03/23 1219  HGB 14.2  --  12.7  --   HCT 44.3  --  39.5  --   PLT 212  --  199  --   HEPARINUNFRC  --   --  0.86* >1.10*  CREATININE 0.83  --  0.71  --   TROPONINIHS 7 8  --   --     Estimated Creatinine Clearance: 73.5 mL/min (by C-G formula based on SCr of 0.71 mg/dL).  Assessment: AC/Heme: PE on CT with moderate clot burden, Ddimer 6.22 - Hep level 0.86 elevated, CBC WNL. Next heparin level >1.1 drawn from [facility] when IV heparin is just below in L hand. MD ok to change to Eliquis since R side is limited for lab sticks  Goal of Therapy:  Therapeutic oral anticoagulation  Plan:  d/c IV heparin and start Eliquis 10mg  BID x 7d, then 5mg  BID    S. Merilynn Finland, PharmD, BCPS Clinical Staff Pharmacist [email or web address] Misty Stanley Stillinger 04/03/2023,2:08 PM

## 2023-04-03 NOTE — Telephone Encounter (Signed)
Patient admitted to [facility] yesterday. Nothing further needed at this time.

## 2023-04-03 NOTE — Progress Notes (Signed)
PROGRESS NOTE    [name]  OVF:[id number] DOB: 03-18-1951 DOA: 04/02/2023 PCP: Adriana Fendt, NP    Brief Narrative:  72 year old with history of GERD, interstitial lung disease, hypertension, hyperlipidemia, hypothyroidism, chronic hypoxemia secondary to pulmonary hypertension and interstitial lung disease on 2 L oxygen at home intermittently who was recently treated for bronchitis.  She was found to have persistent shortness of breath and hypoxemia.  She was found to have bilateral pulmonary embolism and left lower lobe DVT.  Admitted with heparin infusion.   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  Currently hemodynamically stable and on 2 L oxygen with mobility. CT angiogram with evidence of cor pulmonale. Continue heparin infusion.  2D echocardiogram to find any acute findings. Once echocardiogram is done, will treat patient with Eliquis. Primary cause for DVT is unknown, likely less mobility with interstitial lung disease and hypoxemia.  Acute hypoxemic respiratory failure: Suspect acute on chronic.  She will need supplemental oxygen to go home with.  Chronic medical issues including paroxysmal A-fib, in sinus rhythm.  Currently not on any anticoagulation.  Rate controlled with diltiazem. Heart failure with preserved ejection fraction: Euvolemic. Interstitial lung disease: On Dulera, Xyzal, Flonase and Astelin as well as recently completing prednisone taper. Hypothyroidism: On Synthroid. GERD: PPI.   DVT prophylaxis: SCDs Start: 04/02/23 1738   Code Status: Full code Family Communication: None at the bedside Disposition Plan: Status is: Inpatient Remains inpatient appropriate because: PE with cor pulmonale, monitor tolerance for anticoagulation.     Consultants:  None  Procedures:  None  Antimicrobials:  None   Subjective: Patient seen in the morning rounds.  She has some dry cough.  Denies any chest  pain or palpitations.  She is on 2 L oxygen.  Very nervous about new findings.  Objective: Vitals:   04/03/23 0004 04/03/23 0445 04/03/23 0830 04/03/23 0841  BP: 132/68 124/73 123/74   Pulse: 71 65 70   Resp:  20    Temp: 98 F (36.7 C) 97.6 F (36.4 C) 97.7 F (36.5 C)   TempSrc: Oral Oral Axillary   SpO2: 95% 100% 96% 98%  Weight:      Height:        Intake/Output Summary (Last 24 hours) at 04/03/2023 1333 Last data filed at 04/03/2023 0619 Gross per 24 hour  Intake 222.49 ml  Output --  Net 222.49 ml   Filed Weights   04/02/23 1416 04/02/23 2025  Weight: 98.9 kg 98 kg    Examination:  General exam: Appears calm and comfortable  Alert awake and oriented.  Interactive.  Not in any distress. Respiratory system: Bilateral clear.  No added sounds. Cardiovascular system: S1 & S2 heard, RRR.  Gastrointestinal system: Abdomen is nondistended, soft and nontender. No organomegaly or masses felt. Normal bowel sounds heard. Central nervous system: Alert and oriented. No focal neurological deficits. Extremities:  Slight asymmetrical swelling left calf more than the right.    Data Reviewed: I have personally reviewed following labs and imaging studies  CBC: Recent Labs  Lab 03/29/23 1333 04/02/23 1454 04/03/23 0337  WBC 8.1 6.3 6.1  NEUTROABS 6.4 5.8  --   HGB 14.2 14.2 12.7  HCT 43.8 44.3 39.5  MCV 93.5 95.7 94.3  PLT 190.0 212 199   Basic Metabolic Panel: Recent Labs  Lab 03/29/23 1333 04/02/23 1454 04/03/23 0337  NA 134* 137 140  K 4.0 4.5 3.8  CL 103 105 105  CO2 22 26 28   GLUCOSE 103* 132* 100*  BUN 10 12 14   CREATININE 0.84 0.83 0.71  CALCIUM 9.9 9.5 9.6   GFR: Estimated Creatinine Clearance: 73.5 mL/min (by C-G formula based on SCr of 0.71 mg/dL). Liver Function Tests: Recent Labs  Lab 03/29/23 1333 04/02/23 1454  AST 28 27  ALT 33 35  ALKPHOS 125* 109  BILITOT 0.6 0.5  PROT 7.5 7.5  ALBUMIN 4.0 3.7   No results for input(s): "LIPASE",  "AMYLASE" in the last 168 hours. No results for input(s): "AMMONIA" in the last 168 hours. Coagulation Profile: No results for input(s): "INR", "PROTIME" in the last 168 hours. Cardiac Enzymes: No results for input(s): "CKTOTAL", "CKMB", "CKMBINDEX", "TROPONINI" in the last 168 hours. BNP (last 3 results) Recent Labs    08/24/22 1356 03/29/23 1333  PROBNP 76 84.0   HbA1C: No results for input(s): "HGBA1C" in the last 72 hours. CBG: No results for input(s): "GLUCAP" in the last 168 hours. Lipid Profile: No results for input(s): "CHOL", "HDL", "LDLCALC", "TRIG", "CHOLHDL", "LDLDIRECT" in the last 72 hours. Thyroid Function Tests: No results for input(s): "TSH", "T4TOTAL", "FREET4", "T3FREE", "THYROIDAB" in the last 72 hours. Anemia Panel: No results for input(s): "VITAMINB12", "FOLATE", "FERRITIN", "TIBC", "IRON", "RETICCTPCT" in the last 72 hours. Sepsis Labs: No results for input(s): "PROCALCITON", "LATICACIDVEN" in the last 168 hours.  No results found for this or any previous visit (from the past 240 hour(s)).       Radiology Studies: VAS [place] LOWER EXTREMITY VENOUS (DVT) (ONLY [facility] & [facility])  Result Date: 04/02/2023  Lower Venous DVT Study Patient Name:  [name]  Date of Exam:   04/02/2023 Medical Rec #: [id number]          Accession #:    [id number] Date of Birth: Feb 12, 1951          Patient Gender: F Patient Age:   81 years Exam Location:  [facility] Procedure:      VAS [place] LOWER EXTREMITY VENOUS (DVT) Referring Phys: [name] --------------------------------------------------------------------------------  Indications: Pulmonary embolism.  Risk Factors: Recent COVID infection with decreased activity. Limitations: Poor ultrasound/tissue interface. Comparison Study: Previous exams on 08/30/21 and 06/04/21 were negative for DVT Performing Technologist: [name] Mention RVT, RDMS  Examination Guidelines: A complete evaluation includes B-mode imaging, spectral Doppler,  color Doppler, and power Doppler as needed of all accessible portions of each vessel. Bilateral testing is considered an integral part of a complete examination. Limited examinations for reoccurring indications may be performed as noted. The reflux portion of the exam is performed with the patient in reverse Trendelenburg.  +---------+---------------+---------+-----------+----------+-------------------+ RIGHT    CompressibilityPhasicitySpontaneityPropertiesThrombus Aging      +---------+---------------+---------+-----------+----------+-------------------+ CFV      Full           Yes      Yes                                      +---------+---------------+---------+-----------+----------+-------------------+ SFJ      Full                                                             +---------+---------------+---------+-----------+----------+-------------------+ FV Prox  Full  Yes      Yes                                      +---------+---------------+---------+-----------+----------+-------------------+ FV Mid   Full           Yes      Yes                                      +---------+---------------+---------+-----------+----------+-------------------+ FV DistalFull           Yes      Yes                                      +---------+---------------+---------+-----------+----------+-------------------+ PFV      Full                                                             +---------+---------------+---------+-----------+----------+-------------------+ POP      Full           Yes      Yes                                      +---------+---------------+---------+-----------+----------+-------------------+ PTV                                                   Not well visualized +---------+---------------+---------+-----------+----------+-------------------+ PERO                                                  Not well visualized  +---------+---------------+---------+-----------+----------+-------------------+   +---------+---------------+---------+-----------+----------+--------------+ LEFT     CompressibilityPhasicitySpontaneityPropertiesThrombus Aging +---------+---------------+---------+-----------+----------+--------------+ CFV      Full           Yes      Yes                                 +---------+---------------+---------+-----------+----------+--------------+ SFJ      Full                                                        +---------+---------------+---------+-----------+----------+--------------+ FV Prox  Full           Yes      Yes                                 +---------+---------------+---------+-----------+----------+--------------+ FV Mid   Partial        No  No                   Acute          +---------+---------------+---------+-----------+----------+--------------+ FV DistalNone           No       No                   Acute          +---------+---------------+---------+-----------+----------+--------------+ PFV      Full                                                        +---------+---------------+---------+-----------+----------+--------------+ POP      None           No       No                   Acute          +---------+---------------+---------+-----------+----------+--------------+ PTV      Full                                                        +---------+---------------+---------+-----------+----------+--------------+ PERO     Full                                                        +---------+---------------+---------+-----------+----------+--------------+     Summary: BILATERAL: -No evidence of popliteal cyst, bilaterally. RIGHT: - There is no evidence of deep vein thrombosis in the lower extremity.  LEFT: - Findings consistent with acute deep vein thrombosis involving the left femoral vein, and left popliteal vein.   *See table(s) above for measurements and observations. Electronically signed by Coral Else MD on 04/02/2023 at 9:08:42 PM.    Final    CT Angio Chest W/Cm &/Or Wo Cm  Result Date: 04/02/2023 CLINICAL DATA:  Dyspnea.  Upper back pain.  Bronchitis. EXAM: CT ANGIOGRAPHY CHEST WITH CONTRAST TECHNIQUE: Multidetector CT imaging of the chest was performed using the standard protocol during bolus administration of intravenous contrast. Multiplanar CT image reconstructions and MIPs were obtained to evaluate the vascular anatomy. RADIATION DOSE REDUCTION: This exam was performed according to the departmental dose-optimization program which includes automated exposure control, adjustment of the mA and/or kV according to patient size and/or use of iterative reconstruction technique. CONTRAST:  75mL OMNIPAQUE IOHEXOL 350 MG/ML SOLN COMPARISON:  X-ray 03/12/2023 prior CT scans including 07/06/2022 and older FINDINGS: Cardiovascular: Heart is slightly enlarged. No pericardial effusion. Thoracic aorta has a normal course and caliber with scattered atherosclerotic calcified plaque. There is a left upper chest battery pack for defibrillator with leads extending along the right side of the heart pulmonary emboli are identified. There are areas of thrombus along the central right main pulmonary artery at the hilum as well as extending into the lobar and segmental branches in the right lung. There is some segmental and subsegmental emboli as well in the left upper lobe. There is some breathing motion which  limits evaluation of small and peripheral emboli RV to LV ratio 1.02. No reflux of contrast into the intrahepatic IVC slight enlargement of the main pulmonary artery. Please correlate with clinical evidence of right heart strain. Mediastinum/Nodes: No specific abnormal lymph node enlargement identified in the axillary region, hilum or mediastinum. Slightly patulous thoracic esophagus small thyroid gland. Lungs/Pleura: No  consolidation, pneumothorax or effusion. As described previously there are areas of interstitial septal thickening, bronchiectasis scattered in both lungs greatest in the lower lung zones. There is slightly more confluent ground-glass along the lung bases today compared to the previous but this could be related to the motion and level of inflation. Upper Abdomen: Adrenal glands are preserved in the upper abdomen. Colonic diverticula. Musculoskeletal: Diffuse degenerative changes along the spine with bridging osteophytes and syndesmophytes diffusely Review of the MIP images confirms the above findings. Critical Value/emergent results were called by telephone at the time of interpretation on 04/02/2023 at 7:26 am to provider [facility] , who verbally acknowledged these results. IMPRESSION: Bilateral pulmonary emboli, right-greater-than-left. Some central emboli on the right. Overall moderate clot burden. Enlarged heart with some enlargement of the pulmonary artery but this has seen previously. Please correlate for any clinical evidence of right heart strain. Defibrillator. Known interstitial lung disease. Aortic Atherosclerosis (ICD10-I70.0) and Emphysema (ICD10-J43.9). Electronically Signed   By: Karen Kays M.D.   On: 04/02/2023 10:55        Scheduled Meds:  aspirin EC  81 mg Oral Daily   azelastine  1 spray Each Nare BID   cholecalciferol  1,000 Units Oral Daily   cloBAZam  10 mg Oral BID   diltiazem  120 mg Oral Daily   empagliflozin  10 mg Oral Daily   folic acid  1 mg Oral Daily   latanoprost  1 drop Both Eyes QHS   levothyroxine  50 mcg Oral Q0600   loratadine  10 mg Oral Q1200   magnesium oxide  400 mg Oral BID   mometasone-formoterol  2 puff Inhalation BID   montelukast  10 mg Oral QHS   multivitamin with minerals  1 tablet Oral Daily   pantoprazole  40 mg Oral Daily   polyvinyl alcohol  1 drop Both Eyes QHS   potassium chloride  10 mEq Oral Daily   [START ON 04/04/2023]  predniSONE  5 mg Oral Q breakfast   [START ON 04/04/2023] tiZANidine  2 mg Oral QHS   Continuous Infusions:  heparin 1,250 Units/hr (04/03/23 0619)     LOS: 0 days    Time spent: 35 minutes    Dorcas Carrow, MD [facility]

## 2023-04-04 ENCOUNTER — Other Ambulatory Visit (HOSPITAL_COMMUNITY): Payer: Self-pay

## 2023-04-04 DIAGNOSIS — I2609 Other pulmonary embolism with acute cor pulmonale: Secondary | ICD-10-CM | POA: Diagnosis not present

## 2023-04-04 MED ORDER — ENOXAPARIN SODIUM 100 MG/ML IJ SOSY
100.0000 mg | PREFILLED_SYRINGE | Freq: Two times a day (BID) | INTRAMUSCULAR | 0 refills | Status: DC
  Filled 2023-04-04: qty 10, 5d supply, fill #0

## 2023-04-04 MED ORDER — DABIGATRAN ETEXILATE MESYLATE 150 MG PO CAPS
150.0000 mg | ORAL_CAPSULE | Freq: Two times a day (BID) | ORAL | 2 refills | Status: DC
  Filled 2023-04-04 (×2): qty 60, 30d supply, fill #0

## 2023-04-04 MED ORDER — ENOXAPARIN SODIUM 100 MG/ML IJ SOSY
100.0000 mg | PREFILLED_SYRINGE | Freq: Two times a day (BID) | INTRAMUSCULAR | Status: DC
  Administered 2023-04-04: 100 mg via SUBCUTANEOUS
  Filled 2023-04-04: qty 1

## 2023-04-04 NOTE — Discharge Summary (Signed)
Physician Discharge Summary  Aella Shackelford YQM:[id number] DOB: June 26, 1951 DOA: 04/02/2023  PCP: [name], NP  Admit date: 04/02/2023 Discharge date: 04/04/2023  Admitted From: Home Disposition: Home  Recommendations for Outpatient Follow-up:  Follow up with PCP in 1-2 weeks   Home Health: N/A Equipment/Devices: Oxygen  Discharge Condition: Fair CODE STATUS: Full code Diet recommendation: Regular diet  Discharge summary: 72 year old with history of GERD, interstitial lung disease, hypertension, hyperlipidemia, hypothyroidism, chronic hypoxemia secondary to pulmonary hypertension and interstitial lung disease on 2 L oxygen at home intermittently who was recently treated for bronchitis.  She was found to have persistent shortness of breath and hypoxemia.  Elevated D-dimer at pulmonary office prompted CT scans.  She was found to have bilateral pulmonary embolism and left lower lobe DVT.  Admitted with heparin infusion.  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  Currently hemodynamically stable and on 2 L oxygen with mobility. CT angiogram with evidence of cor pulmonale. 2D echocardiogram without evidence of right ventricular strain.  Ejection fraction 40% and mostly chronic. Lower extremity duplex is positive for DVT left leg. Primary cause for DVT is unknown, likely less mobility with interstitial lung disease and hypoxemia. With clinical stability, she was treated with heparin and then subsequent Eliquis. Cost prohibitive to Eliquis and Xarelto. Patient will be discharged home with Lovenox 1 mg/kg per dose twice daily for 5 days and then subsequent she will take Pradaxa 150 mg twice daily to continue.  She will follow-up outpatient.  # Acute hypoxemic respiratory failure: Suspect acute on chronic.  She will need supplemental oxygen to go home with.  Patient also uses CPAP at night.  Chronic medical issues including paroxysmal A-fib, in sinus  rhythm.  Currently not on any anticoagulation.  Now on Pradaxa.  Rate controlled with diltiazem.  Heart failure with preserved ejection fraction: Euvolemic.  New echocardiogram shows slightly depressed ejection fraction than before.  Currently not requiring diuretics.  Interstitial lung disease: On Dulera, Xyzal, Flonase and Astelin as well as recently completing prednisone taper.  Hypothyroidism: On Synthroid.  GERD: PPI.  Medically stable for discharge.  Discharge Diagnoses:  Principal Problem:   Acute pulmonary embolism (HCC) Active Problems:   Pulmonary embolism [facility])    Discharge Instructions  Discharge Instructions     Diet - low sodium heart healthy   Complete by: As directed    Increase activity slowly   Complete by: As directed       Allergies as of 04/04/2023       Reactions   Ace Inhibitors Swelling   Angioedema; makes tongue "break out"   Amitriptyline Hcl Other (See Comments)    makes her too sleepy!   Atorvastatin Other (See Comments)   SEVERE MYALGIA   Dilantin [phenytoin Sodium Extended] Rash   Severe rash   Duloxetine Hcl Nausea Only, Other (See Comments)   Sleepiness/ sick Other Reaction(s): GI Intolerance   Paroxetine Nausea Only   Rapid heartbeat   Ramipril Other (See Comments)   TONGUE ULCERS   Rosuvastatin Other (See Comments)   SEVERE MYALGIA   Carbamazepine Rash   Codeine Itching   Phenytoin Sodium Extended Rash   Simvastatin Other (See Comments)   Increase in CK, myalgias   Wellbutrin [bupropion] Palpitations        Medication List     STOP taking these medications    aspirin EC 81 MG tablet   doxycycline 100 MG tablet Commonly known as: VIBRA-TABS   predniSONE  10 MG tablet Commonly known as: DELTASONE       TAKE these medications    albuterol 108 (90 Base) MCG/ACT inhaler Commonly known as: Ventolin HFA Inhale 2 puffs into the lungs every 6 (six) hours as needed for wheezing or shortness of breath.    azelastine 0.1 % nasal spray Commonly known as: ASTELIN Place 1 spray into both nostrils 2 (two) times daily. Use in each nostril as directed   benzonatate 200 MG capsule Commonly known as: TESSALON Take 1 capsule (200 mg total) by mouth 3 (three) times daily as needed for cough.   budesonide-formoterol 80-4.5 MCG/ACT inhaler Commonly known as: Symbicort Inhale 1 puff into the lungs 2 (two) times daily.   cloBAZam 10 MG tablet Commonly known as: Onfi Take 1 tablet (10 mg total) by mouth 2 (two) times daily.   dabigatran 150 MG Caps capsule Commonly known as: PRADAXA Take 1 capsule (150 mg total) by mouth 2 (two) times daily. Start taking on: April 10, 2023   Dialyvite Vitamin D 5000 125 MCG (5000 UT) capsule Generic drug: Cholecalciferol Take 5,000 Units by mouth daily.   diltiazem 120 MG 24 hr capsule Commonly known as: Cardizem CD Take 1 capsule (120 mg total) by mouth daily.   empagliflozin 10 MG Tabs tablet Commonly known as: JARDIANCE Take 1 tablet (10 mg total) by mouth daily.   enoxaparin 100 MG/ML injection Commonly known as: LOVENOX Inject 1 mL (100 mg total) into the skin every 12 (twelve) hours for 5 days.   famotidine 40 MG tablet Commonly known as: Pepcid Take 1 tablet (40 mg total) by mouth at bedtime. NEEDS APPT FOR FURTHER REFILLS   fexofenadine 180 MG tablet Commonly known as: ALLEGRA Take 180 mg by mouth daily as needed for allergies (alternates with zyrtec sometimes).   fluticasone 50 MCG/ACT nasal spray Commonly known as: FLONASE Place 2 sprays into both nostrils daily as needed for allergies or rhinitis.   Flutter Devi 1 Device by Does not apply route as needed.   FOLIC ACID PO Take 1 tablet by mouth daily.   furosemide 20 MG tablet Commonly known as: LASIX TAKE 2 TABLETS (40 MG) 3 DAYS PER WEEK AND TAKE 1 TABLET (20 MG) THE OTHER DAYS OF THE WEEK What changed:  how much to take how to take this when to take this reasons to take  this additional instructions   hydroquinone 4 % cream Apply 1 Application topically at bedtime.   hydroxypropyl methylcellulose / hypromellose 2.5 % ophthalmic solution Commonly known as: ISOPTO TEARS / GONIOVISC Place 1 drop into both eyes at bedtime.   latanoprost 0.005 % ophthalmic solution Commonly known as: XALATAN Place 1 drop into both eyes at bedtime.   levocetirizine 5 MG tablet Commonly known as: XYZAL Take 5 mg by mouth daily at 12 noon.   levothyroxine 50 MCG tablet Commonly known as: SYNTHROID TAKE 1 TABLET EVERY DAY   magnesium oxide 400 (240 Mg) MG tablet Commonly known as: MAG-OX TAKE 1 TABLET TWICE DAILY   montelukast 10 MG tablet Commonly known as: SINGULAIR TAKE 1 TABLET AT BEDTIME   MULTIVITAMIN PO Take 1 tablet by mouth daily.   pantoprazole 40 MG tablet Commonly known as: PROTONIX TAKE 1 TABLET EVERY DAY   polyethylene glycol powder 17 GM/SCOOP powder Commonly known as: GLYCOLAX/MIRALAX Take 17 grams by mouth daily   potassium chloride 10 MEQ tablet Commonly known as: KLOR-CON M TAKE 1/2 TABLET EVERY DAY (CALL OFFICE TO SCHEDULE YEARLY APPT)  promethazine-dextromethorphan 6.25-15 MG/5ML syrup Commonly known as: PROMETHAZINE-DM Take 5 mLs by mouth 4 (four) times daily as needed for cough.   Repatha SureClick 140 MG/ML Soaj Generic drug: Evolocumab Inject 140 mg into the skin every 14 (fourteen) days.   sodium chloride HYPERTONIC 3 % nebulizer solution Take by nebulization 2 (two) times daily as needed for cough. 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   tiZANidine 4 MG tablet Commonly known as: Zanaflex Take 0.5-1 tablets (2-4 mg total) by mouth 2 (two) times daily.   traZODone 100 MG tablet Commonly known as: DESYREL TAKE 1 TO 1 AND 1/2 TABLETS AT BEDTIME               Durable Medical Equipment  (From admission, onward)           Start     Ordered   04/04/23 1055  DME Oxygen  Once       Question Answer Comment  Length of Need  Lifetime   Mode or (Route) Nasal cannula   Liters per Minute 2   Frequency Continuous (stationary and portable oxygen unit needed)   Oxygen conserving device Yes   Oxygen delivery system Gas      04/04/23 1055            Allergies  Allergen Reactions   Ace Inhibitors Swelling    Angioedema; makes tongue "break out"    Amitriptyline Hcl Other (See Comments)     makes her too sleepy!   Atorvastatin Other (See Comments)    SEVERE MYALGIA   Dilantin [Phenytoin Sodium Extended] Rash    Severe rash   Duloxetine Hcl Nausea Only and Other (See Comments)    Sleepiness/ sick  Other Reaction(s): GI Intolerance   Paroxetine Nausea Only    Rapid heartbeat   Ramipril Other (See Comments)    TONGUE ULCERS    Rosuvastatin Other (See Comments)    SEVERE MYALGIA   Carbamazepine Rash   Codeine Itching   Phenytoin Sodium Extended Rash   Simvastatin Other (See Comments)    Increase in CK, myalgias   Wellbutrin [Bupropion] Palpitations    Consultations: None   Procedures/Studies: ECHOCARDIOGRAM COMPLETE  Result Date: 04/03/2023    ECHOCARDIOGRAM REPORT   Patient Name:   REISE HURLOCK Date of Exam: 04/03/2023 Medical Rec #:  [id number]         Height:       66.5 in Accession #:    [id number]        Weight:       216.0 lb Date of Birth:  [birth year]         BSA:          2.078 m Patient Age:    72 years          BP:           123/74 mmHg Patient Gender: F                 HR:           70 bpm. Exam Location:  Inpatient Procedure: 2D Echo, Cardiac Doppler and Color Doppler Indications:    Pulmonary Embolus I26.09  History:        Patient has prior history of Echocardiogram examinations, most                 recent 08/10/2022.  Sonographer:    Aron Baba Referring Phys: [id number] [facility]  Sonographer Comments: Image acquisition challenging due to patient body habitus  and Image acquisition challenging due to respiratory motion. IMPRESSIONS  1. Left ventricular ejection fraction, by  estimation, is 40 to 45%. The left ventricle has mildly decreased function. The left ventricle demonstrates global hypokinesis. Left ventricular diastolic parameters are consistent with Grade I diastolic dysfunction (impaired relaxation). Elevated left atrial pressure.  2. Right ventricular systolic function is normal. The right ventricular size is normal. There is mildly elevated pulmonary artery systolic pressure.  3. Left atrial size was mildly dilated.  4. The mitral valve is normal in structure. Trivial mitral valve regurgitation. No evidence of mitral stenosis.  5. The aortic valve is tricuspid. Aortic valve regurgitation is not visualized. No aortic stenosis is present.  6. The inferior vena cava is normal in size with greater than 50% respiratory variability, suggesting right atrial pressure of 3 mmHg. FINDINGS  Left Ventricle: Left ventricular ejection fraction, by estimation, is 40 to 45%. The left ventricle has mildly decreased function. The left ventricle demonstrates global hypokinesis. The left ventricular internal cavity size was normal in size. There is  no left ventricular hypertrophy. Left ventricular diastolic parameters are consistent with Grade I diastolic dysfunction (impaired relaxation). Elevated left atrial pressure. Right Ventricle: The right ventricular size is normal. Right ventricular systolic function is normal. There is mildly elevated pulmonary artery systolic pressure. The tricuspid regurgitant velocity is 2.96 m/s, and with an assumed right atrial pressure of 3 mmHg, the estimated right ventricular systolic pressure is 38.0 mmHg. Left Atrium: Left atrial size was mildly dilated. Right Atrium: Right atrial size was normal in size. Pericardium: There is no evidence of pericardial effusion. Mitral Valve: The mitral valve is normal in structure. Trivial mitral valve regurgitation. No evidence of mitral valve stenosis. Tricuspid Valve: The tricuspid valve is normal in structure.  Tricuspid valve regurgitation is mild . No evidence of tricuspid stenosis. Aortic Valve: The aortic valve is tricuspid. Aortic valve regurgitation is not visualized. No aortic stenosis is present. Pulmonic Valve: The pulmonic valve was normal in structure. Pulmonic valve regurgitation is trivial. No evidence of pulmonic stenosis. Aorta: The aortic root is normal in size and structure. Venous: The inferior vena cava is normal in size with greater than 50% respiratory variability, suggesting right atrial pressure of 3 mmHg. IAS/Shunts: No atrial level shunt detected by color flow Doppler. Additional Comments: A device lead is visualized.  LEFT VENTRICLE PLAX 2D LVIDd:         5.10 cm      Diastology LVIDs:         3.80 cm      LV e' medial:    4.79 cm/s LV PW:         1.10 cm      LV E/e' medial:  16.2 LV IVS:        0.70 cm      LV e' lateral:   6.53 cm/s LVOT diam:     2.30 cm      LV E/e' lateral: 11.9 LV SV:         69 LV SV Index:   33 LVOT Area:     4.15 cm  LV Volumes (MOD) LV vol d, MOD A2C: 120.0 ml LV vol d, MOD A4C: 125.0 ml LV vol s, MOD A2C: 62.9 ml LV vol s, MOD A4C: 67.4 ml LV SV MOD A2C:     57.1 ml LV SV MOD A4C:     125.0 ml LV SV MOD BP:      57.2 ml RIGHT VENTRICLE RV  S prime:     9.14 cm/s TAPSE (M-mode): 2.1 cm LEFT ATRIUM             Index        RIGHT ATRIUM           Index LA diam:        3.40 cm 1.64 cm/m   RA Area:     15.70 cm LA Vol (A2C):   58.4 ml 28.10 ml/m  RA Volume:   36.40 ml  17.52 ml/m LA Vol (A4C):   76.6 ml 36.86 ml/m LA Biplane Vol: 71.5 ml 34.41 ml/m  AORTIC VALVE             PULMONIC VALVE LVOT Vmax:   74.70 cm/s  PR End Diast Vel: 3.46 msec LVOT Vmean:  53.900 cm/s LVOT VTI:    0.165 m  AORTA Ao Root diam: 3.30 cm Ao Asc diam:  3.50 cm MITRAL VALVE                TRICUSPID VALVE MV Area (PHT): 4.31 cm     TR Peak grad:   35.0 mmHg MV Decel Time: 176 msec     TR Vmax:        296.00 cm/s MR Peak grad: 25.6 mmHg MR Vmax:      253.00 cm/s   SHUNTS MV E velocity: 77.60  cm/s   Systemic VTI:  0.16 m MV A velocity: 115.00 cm/s  Systemic Diam: 2.30 cm MV E/A ratio:  0.67 Olga Millers MD Electronically signed by Olga Millers MD Signature Date/Time: 04/03/2023/2:57:35 PM    Final    VAS [place] LOWER EXTREMITY VENOUS (DVT) (ONLY [facility] & [facility])  Result Date: 04/02/2023  Lower Venous DVT Study Patient Name:  KEYARRA RIPPENTROP  Date of Exam:   04/02/2023 Medical Rec #: [id number]          Accession #:    [id number] Date of Birth: 1951-08-09          Patient Gender: F Patient Age:   36 years Exam Location:  [facility] Procedure:      VAS [place] LOWER EXTREMITY VENOUS (DVT) Referring Phys: BRITNI HENDERLY --------------------------------------------------------------------------------  Indications: Pulmonary embolism.  Risk Factors: Recent COVID infection with decreased activity. Limitations: Poor ultrasound/tissue interface. Comparison Study: Previous exams on 08/30/21 and 06/04/21 were negative for DVT Performing Technologist: Ernestene Mention RVT, RDMS  Examination Guidelines: A complete evaluation includes B-mode imaging, spectral Doppler, color Doppler, and power Doppler as needed of all accessible portions of each vessel. Bilateral testing is considered an integral part of a complete examination. Limited examinations for reoccurring indications may be performed as noted. The reflux portion of the exam is performed with the patient in reverse Trendelenburg.  +---------+---------------+---------+-----------+----------+-------------------+ RIGHT    CompressibilityPhasicitySpontaneityPropertiesThrombus Aging      +---------+---------------+---------+-----------+----------+-------------------+ CFV      Full           Yes      Yes                                      +---------+---------------+---------+-----------+----------+-------------------+ SFJ      Full                                                              +---------+---------------+---------+-----------+----------+-------------------+  FV Prox  Full           Yes      Yes                                      +---------+---------------+---------+-----------+----------+-------------------+ FV Mid   Full           Yes      Yes                                      +---------+---------------+---------+-----------+----------+-------------------+ FV DistalFull           Yes      Yes                                      +---------+---------------+---------+-----------+----------+-------------------+ PFV      Full                                                             +---------+---------------+---------+-----------+----------+-------------------+ POP      Full           Yes      Yes                                      +---------+---------------+---------+-----------+----------+-------------------+ PTV                                                   Not well visualized +---------+---------------+---------+-----------+----------+-------------------+ PERO                                                  Not well visualized +---------+---------------+---------+-----------+----------+-------------------+   +---------+---------------+---------+-----------+----------+--------------+ LEFT     CompressibilityPhasicitySpontaneityPropertiesThrombus Aging +---------+---------------+---------+-----------+----------+--------------+ CFV      Full           Yes      Yes                                 +---------+---------------+---------+-----------+----------+--------------+ SFJ      Full                                                        +---------+---------------+---------+-----------+----------+--------------+ FV Prox  Full           Yes      Yes                                 +---------+---------------+---------+-----------+----------+--------------+  FV Mid   Partial        No       No                    Acute          +---------+---------------+---------+-----------+----------+--------------+ FV DistalNone           No       No                   Acute          +---------+---------------+---------+-----------+----------+--------------+ PFV      Full                                                        +---------+---------------+---------+-----------+----------+--------------+ POP      None           No       No                   Acute          +---------+---------------+---------+-----------+----------+--------------+ PTV      Full                                                        +---------+---------------+---------+-----------+----------+--------------+ PERO     Full                                                        +---------+---------------+---------+-----------+----------+--------------+     Summary: BILATERAL: -No evidence of popliteal cyst, bilaterally. RIGHT: - There is no evidence of deep vein thrombosis in the lower extremity.  LEFT: - Findings consistent with acute deep vein thrombosis involving the left femoral vein, and left popliteal vein.  *See table(s) above for measurements and observations. Electronically signed by Coral Else MD on 04/02/2023 at 9:08:42 PM.    Final    CT Angio Chest W/Cm &/Or Wo Cm  Result Date: 04/02/2023 CLINICAL DATA:  Dyspnea.  Upper back pain.  Bronchitis. EXAM: CT ANGIOGRAPHY CHEST WITH CONTRAST TECHNIQUE: Multidetector CT imaging of the chest was performed using the standard protocol during bolus administration of intravenous contrast. Multiplanar CT image reconstructions and MIPs were obtained to evaluate the vascular anatomy. RADIATION DOSE REDUCTION: This exam was performed according to the departmental dose-optimization program which includes automated exposure control, adjustment of the mA and/or kV according to patient size and/or use of iterative reconstruction technique. CONTRAST:  75mL OMNIPAQUE  IOHEXOL 350 MG/ML SOLN COMPARISON:  X-ray 03/12/2023 prior CT scans including 07/06/2022 and older FINDINGS: Cardiovascular: Heart is slightly enlarged. No pericardial effusion. Thoracic aorta has a normal course and caliber with scattered atherosclerotic calcified plaque. There is a left upper chest battery pack for defibrillator with leads extending along the right side of the heart pulmonary emboli are identified. There are areas of thrombus along the central right main pulmonary artery at the hilum as well as extending into the lobar and segmental branches in the right lung.  There is some segmental and subsegmental emboli as well in the left upper lobe. There is some breathing motion which limits evaluation of small and peripheral emboli RV to LV ratio 1.02. No reflux of contrast into the intrahepatic IVC slight enlargement of the main pulmonary artery. Please correlate with clinical evidence of right heart strain. Mediastinum/Nodes: No specific abnormal lymph node enlargement identified in the axillary region, hilum or mediastinum. Slightly patulous thoracic esophagus small thyroid gland. Lungs/Pleura: No consolidation, pneumothorax or effusion. As described previously there are areas of interstitial septal thickening, bronchiectasis scattered in both lungs greatest in the lower lung zones. There is slightly more confluent ground-glass along the lung bases today compared to the previous but this could be related to the motion and level of inflation. Upper Abdomen: Adrenal glands are preserved in the upper abdomen. Colonic diverticula. Musculoskeletal: Diffuse degenerative changes along the spine with bridging osteophytes and syndesmophytes diffusely Review of the MIP images confirms the above findings. Critical Value/emergent results were called by telephone at the time of interpretation on 04/02/2023 at 7:26 am to provider [facility] , who verbally acknowledged these results. IMPRESSION: Bilateral  pulmonary emboli, right-greater-than-left. Some central emboli on the right. Overall moderate clot burden. Enlarged heart with some enlargement of the pulmonary artery but this has seen previously. Please correlate for any clinical evidence of right heart strain. Defibrillator. Known interstitial lung disease. Aortic Atherosclerosis (ICD10-I70.0) and Emphysema (ICD10-J43.9). Electronically Signed   By: Karen Kays M.D.   On: 04/02/2023 10:55   DG Chest 2 View  Result Date: 03/12/2023 CLINICAL DATA:  COVID-19, bronchiectasis, interstitial lung disease EXAM: CHEST - 2 VIEW COMPARISON:  Previous studies including chest radiograph done on 08/29/2021 and CT done on 07/06/2022 FINDINGS: Transverse diameter of heart is increased. Pacemaker/defibrillator battery is seen in the left infraclavicular region. Increased interstitial markings in the right upper and both lower lung fields have not changed. No new focal infiltrates are seen. There are no signs of alveolar pulmonary edema. There is no pleural effusion or pneumothorax. IMPRESSION: Cardiomegaly. Increased interstitial markings in right upper and both lower lung fields suggest scarring. There are no signs of alveolar pulmonary edema or new focal infiltrates. Electronically Signed   By: Ernie Avena M.D.   On: 03/12/2023 13:15   (Echo, Carotid, EGD, Colonoscopy, ERCP)    Subjective: Patient seen and examined.  Multiple discussions about blood thinners.  Pharmacy was very helpful to run cost analysis on DOAC.  Decided to take Lovenox for 5 days and then subsequently go on Pradaxa.  Patient is comfortable using injections at home.   Discharge Exam: Vitals:   04/04/23 0530 04/04/23 0759  BP: 138/72   Pulse: 68   Resp: 20   Temp: 97.6 F (36.4 C)   SpO2: 97% 99%   Vitals:   04/03/23 2000 04/03/23 2039 04/04/23 0530 04/04/23 0759  BP: 133/72  138/72   Pulse: 71  68   Resp: 18  20   Temp: 98 F (36.7 C)  97.6 F (36.4 C)   TempSrc: Oral   Oral   SpO2: 98% 97% 97% 99%  Weight:      Height:        General: Pt is alert, awake, not in acute distress Walking around.  On 2 L oxygen. Cardiovascular: RRR, S1/S2 +, no rubs, no gallops Respiratory: CTA bilaterally, no wheezing, no rhonchi Abdominal: Soft, NT, ND, bowel sounds + Extremities: Trace edema left leg.  No cyanosis.    The results of  significant diagnostics from this hospitalization (including imaging, microbiology, ancillary and laboratory) are listed below for reference.     Microbiology: No results found for this or any previous visit (from the past 240 hour(s)).   Labs: BNP (last 3 results) Recent Labs    04/02/23 1454  BNP 63.8   Basic Metabolic Panel: Recent Labs  Lab 03/29/23 1333 04/02/23 1454 04/03/23 0337  NA 134* 137 140  K 4.0 4.5 3.8  CL 103 105 105  CO2 22 26 28   GLUCOSE 103* 132* 100*  BUN 10 12 14   CREATININE 0.84 0.83 0.71  CALCIUM 9.9 9.5 9.6   Liver Function Tests: Recent Labs  Lab 03/29/23 1333 04/02/23 1454  AST 28 27  ALT 33 35  ALKPHOS 125* 109  BILITOT 0.6 0.5  PROT 7.5 7.5  ALBUMIN 4.0 3.7   No results for input(s): "LIPASE", "AMYLASE" in the last 168 hours. No results for input(s): "AMMONIA" in the last 168 hours. CBC: Recent Labs  Lab 03/29/23 1333 04/02/23 1454 04/03/23 0337  WBC 8.1 6.3 6.1  NEUTROABS 6.4 5.8  --   HGB 14.2 14.2 12.7  HCT 43.8 44.3 39.5  MCV 93.5 95.7 94.3  PLT 190.0 212 199   Cardiac Enzymes: No results for input(s): "CKTOTAL", "CKMB", "CKMBINDEX", "TROPONINI" in the last 168 hours. BNP: Invalid input(s): "POCBNP" CBG: No results for input(s): "GLUCAP" in the last 168 hours. D-Dimer Recent Labs    04/02/23 1454  DDIMER 6.22*   Hgb A1c No results for input(s): "HGBA1C" in the last 72 hours. Lipid Profile No results for input(s): "CHOL", "HDL", "LDLCALC", "TRIG", "CHOLHDL", "LDLDIRECT" in the last 72 hours. Thyroid function studies No results for input(s): "TSH",  "T4TOTAL", "T3FREE", "THYROIDAB" in the last 72 hours.  Invalid input(s): "FREET3" Anemia work up No results for input(s): "VITAMINB12", "FOLATE", "FERRITIN", "TIBC", "IRON", "RETICCTPCT" in the last 72 hours. Urinalysis    Component Value Date/Time   COLORURINE YELLOW 02/03/2016 1414   APPEARANCEUR Cloudy (A) 06/23/2020 1149   LABSPEC 1.021 02/03/2016 1414   PHURINE 6.0 02/03/2016 1414   GLUCOSEU Negative 06/23/2020 1149   HGBUR NEGATIVE 02/03/2016 1414   HGBUR trace-intact 03/29/2010 1457   BILIRUBINUR negative 12/19/2021 1107   BILIRUBINUR Negative 11/21/2021 0906   BILIRUBINUR Negative 06/23/2020 1149   KETONESUR trace (5) (A) 12/19/2021 1107   KETONESUR NEGATIVE 02/03/2016 1414   PROTEINUR Negative 11/21/2021 0906   PROTEINUR Trace 06/23/2020 1149   PROTEINUR NEGATIVE 02/03/2016 1414   UROBILINOGEN 0.2 12/19/2021 1107   UROBILINOGEN 0.2 03/29/2010 1457   NITRITE Negative 12/19/2021 1107   NITRITE Negative 11/21/2021 0906   NITRITE Negative 06/23/2020 1149   NITRITE NEGATIVE 02/03/2016 1414   LEUKOCYTESUR Negative 12/19/2021 1107   LEUKOCYTESUR Negative 06/23/2020 1149   Sepsis Labs Recent Labs  Lab 03/29/23 1333 04/02/23 1454 04/03/23 0337  WBC 8.1 6.3 6.1   Microbiology No results found for this or any previous visit (from the past 240 hour(s)).   Time coordinating discharge:  32 minutes  SIGNED:   Dorcas Carrow, MD  [facility] 04/04/2023, 10:55 AM

## 2023-04-04 NOTE — Plan of Care (Signed)
  Problem: Clinical Measurements: Goal: Diagnostic test results will improve Outcome: Progressing Goal: Cardiovascular complication will be avoided Outcome: Progressing   

## 2023-04-04 NOTE — Discharge Instructions (Signed)
Information on my medicine - Pradaxa (dabigatran)  This medication education was reviewed with me or my healthcare representative as part of my discharge preparation. Why was Pradaxa prescribed for you? Pradaxa was prescribed to treat blood clots that may have been found in the veins of your legs (deep vein thrombosis) or in your lungs (pulmonary embolism) and to reduce the risk of them occurring again.  Pradaxa will take the place of the injectable anticoagulant medication you have been receiving for this condition for the last 5-10 days.   Start Pradaxa on 04/09/2023  What do you Need to know about PradAXa? Take your Pradaxa TWICE DAILY - one capsule in the morning and one tablet in the evening with or without food.  It would be best to take the doses about the same time each day.  The capsules should not be broken, chewed or opened - they must be swallowed whole.  Do not store Pradaxa in other medication containers - once the bottle is opened the Pradaxa should be used within FOUR months; throw away any capsules that haven't been by that time.  Take Pradaxa exactly as prescribed by your doctor.  DO NOT stop taking Pradaxa without talking to the doctor who prescribed the medication.  Refill your prescription before you run out.  After discharge, you should have regular check-up appointments with your healthcare provider that is prescribing your Pradaxa.  In the future your dose may need to be changed if your kidney function or weight changes by a significant amount.  What do you do if you miss a dose? If you miss a dose, take it as soon as you remember on the same day.  If your next dose is less than 6 hours away, skip the missed dose.  Do not take two doses of PRADAXA at the same time.  Important Safety Information A possible side effect of Pradaxa is bleeding. You should call your healthcare provider right away if you experience any of the following: Bleeding from an injury or  your nose that does not stop. Unusual colored urine (red or dark brown) or unusual colored stools (red or black). Unusual bruising for unknown reasons. A serious fall or if you hit your head (even if there is no bleeding).  Some medicines may interact with Pradaxa and might increase your risk of bleeding or clotting while on Pradaxa. To help avoid this, consult your healthcare provider or pharmacist prior to using any new prescription or non-prescription medications, including herbals, vitamins, non-steroidal anti-inflammatory drugs (NSAIDs) and supplements.  This website has more information on Pradaxa (dabigatran): www.[email or web address].dk.

## 2023-04-04 NOTE — Progress Notes (Signed)
ANTICOAGULATION CONSULT NOTE   Pharmacy Consult for Eliquis --> LMWH Indication: acute pulmonary embolus and DVT  Allergies  Allergen Reactions   Ace Inhibitors Swelling    Angioedema; makes tongue "break out"    Amitriptyline Hcl Other (See Comments)     makes her too sleepy!   Atorvastatin Other (See Comments)    SEVERE MYALGIA   Dilantin [Phenytoin Sodium Extended] Rash    Severe rash   Duloxetine Hcl Nausea Only and Other (See Comments)    Sleepiness/ sick  Other Reaction(s): GI Intolerance   Paroxetine Nausea Only    Rapid heartbeat   Ramipril Other (See Comments)    TONGUE ULCERS    Rosuvastatin Other (See Comments)    SEVERE MYALGIA   Carbamazepine Rash   Codeine Itching   Phenytoin Sodium Extended Rash   Simvastatin Other (See Comments)    Increase in CK, myalgias   Wellbutrin [Bupropion] Palpitations    Patient Measurements: Height: 5' 6.5" (168.9 cm) Weight: 98 kg (216 lb 0.8 oz) IBW/kg (Calculated) : 60.45 Heparin Dosing Weight:   Vital Signs: Temp: 97.6 F (36.4 C) (08/15 0530) Temp Source: Oral (08/15 0530) BP: 138/72 (08/15 0530) Pulse Rate: 68 (08/15 0530)  Labs: Recent Labs    04/02/23 1454 04/02/23 1650 04/03/23 0337 04/03/23 1219  HGB 14.2  --  12.7  --   HCT 44.3  --  39.5  --   PLT 212  --  199  --   HEPARINUNFRC  --   --  0.86* >1.10*  CREATININE 0.83  --  0.71  --   TROPONINIHS 7 8  --   --     Estimated Creatinine Clearance: 73.5 mL/min (by C-G formula based on SCr of 0.71 mg/dL).  Assessment: Patient is a 72 y.o F who presented to the ED on 04/02/23 with c/o CP and SOB.  She was subsequently found to have bilateral PE with moderate clot burden and acute LLE DVT.  She was started on heparin drip on admission and transitioned to Eliquis on 8/14. Due to high copay for Eliquis, current plan is to treat with lovenox for 5 days and then start pradaxa. Pharmacy has been consulted to dose Lovenox.   Today, 04/04/2023: - last dose of  Eliquis 10 mg given to patient on 04/03/23 at 11p - no new labs today - no bleeding documented   Goal of Therapy:  Anti-Xa level 0.6-1 units/ml 4hrs after LMWH dose given Monitor platelets by anticoagulation protocol: Yes   Plan:  - d/c Eliquis  - start lovenox 100mg  SQ q12h with first dose at 11a today - anticipate to start Pradaxa 150 mg bid on 04/09/23 (s/o 5 days of Lovenox)  - per MD, plan is to discharge pt home today  Lucia Gaskins 04/04/2023,9:48 AM

## 2023-04-04 NOTE — TOC CM/SW Note (Addendum)
Transition of Care [facility]) - Inpatient Brief Assessment   Patient Details  Name: [name] MRN: [id number] Date of Birth: October 21, 1950  Transition of [facility]) CM/SW Contact:    Larrie Kass, LCSW Phone Number: 04/04/2023, 11:34 AM   Clinical Narrative:  Pt has requested a new portable O2 tank. Pt receives her home O2 through [employer]. CSW reached out to [place], the new O2 tank will be delivered to pt's room before d/c. No further TOC needs TOC sign-off.    Transition of Care Asessment: Insurance and Status: Insurance coverage has been reviewed Patient has primary care physician: Yes Home environment has been reviewed: yes   Prior/Current Home Services: No current home services Social Determinants of Health Reivew: SDOH reviewed no interventions necessary Readmission risk has been reviewed: Yes Transition of care needs: no transition of care needs at this time

## 2023-04-05 ENCOUNTER — Telehealth: Payer: Self-pay

## 2023-04-05 NOTE — Transitions of Care (Post Inpatient/ED Visit) (Signed)
04/05/2023  Name: [name] MRN: [id number] DOB: 07-Dec-1950  Today's TOC FU Call Status: Today's TOC FU Call Status:: Successful TOC FU Call Completed TOC FU Call Complete Date: 04/05/23  Transition Care Management Follow-up Telephone Call Date of Discharge: 04/04/23 Discharge Facility: [facility]) Type of Discharge: Inpatient Admission Primary Inpatient Discharge Diagnosis:: Acute Pulmonary Embolism How have you been since you were released from the hospital?: Better (Patient notes that she is very tired since her dc, but she is doing well.) Any questions or concerns?: No  Items Reviewed: Did you receive and understand the discharge instructions provided?: Yes Medications obtained,verified, and reconciled?: Partial Review Completed Reason for Partial Mediation Review: Discussed discharge medications Any new allergies since your discharge?: No Dietary orders reviewed?: No Do you have support at home?: No (Patient can call her son, he may help or she can call her church members)  Medications Reviewed Today: Medications Reviewed Today     Reviewed by Jodelle Gross, RN (Case Manager) on 04/05/23 at 1606  Med List Status: <None>   Medication Order Taking? Sig Documenting Provider Last Dose Status Informant  albuterol (VENTOLIN HFA) 108 (90 Base) MCG/ACT inhaler [id number]  Inhale 2 puffs into the lungs every 6 (six) hours as needed for wheezing or shortness of breath. Coralyn Helling, MD  Active Self, Pharmacy Records  azelastine (ASTELIN) 0.1 % nasal spray [id number]  Place 1 spray into both nostrils 2 (two) times daily. Use in each nostril as directed Coralyn Helling, MD  Active Self, Pharmacy Records  benzonatate (TESSALON) 200 MG capsule [id number]  Take 1 capsule (200 mg total) by mouth 3 (three) times daily as needed for cough. Kalman Shan, MD  Active Self, Pharmacy Records  budesonide-formoterol [facility]) 80-4.5 MCG/ACT inhaler [id number]  Inhale 1 puff into the  lungs 2 (two) times daily. Coralyn Helling, MD  Active Self, Pharmacy Records  Cholecalciferol (DIALYVITE VITAMIN D 5000) 125 MCG (5000 UT) capsule [id number]  Take 5,000 Units by mouth daily. [provider]  Active Self, Pharmacy Records  cloBAZam (ONFI) 10 MG tablet [id number]  Take 1 tablet (10 mg total) by mouth 2 (two) times daily. Anson Fret, MD  Active Self, Pharmacy Records  dabigatran [facility]) 150 MG CAPS capsule [id number] Yes Take 1 capsule (150 mg total) by mouth 2 (two) times daily. Dorcas Carrow, MD Taking Active   diltiazem (CARDIZEM CD) 120 MG 24 hr capsule [id number]  Take 1 capsule (120 mg total) by mouth daily. Duke Salvia, MD  Active Self, Pharmacy Records  empagliflozin (JARDIANCE) 10 MG TABS tablet [id number]  Take 1 tablet (10 mg total) by mouth daily. Duke Salvia, MD  Active Self, Pharmacy Records  enoxaparin (LOVENOX) 100 MG/ML injection [id number] Yes Inject 1 mL (100 mg total) into the skin every 12 (twelve) hours for 5 days. Dorcas Carrow, MD Taking Active   Evolocumab [facility]) 140 MG/ML Ivory Broad [id number]  Inject 140 mg into the skin every 14 (fourteen) days. Corky Crafts, MD  Active Self, Pharmacy Records  famotidine (PEPCID) 40 MG tablet [id number]  Take 1 tablet (40 mg total) by mouth at bedtime. NEEDS APPT FOR FURTHER REFILLS Pyrtle, Carie Caddy, MD  Active Self, Pharmacy Records  fexofenadine [facility]) 180 MG tablet [id number]  Take 180 mg by mouth daily as needed for allergies (alternates with zyrtec sometimes). [provider]  Active Self, Pharmacy Records           Med Note [phone], [place] M   Wed Jan 09, 2023  9:52 AM) PER PT TAKES PRN  fluticasone (FLONASE) 50 MCG/ACT nasal spray [id number]  Place 2 sprays into both nostrils daily as needed for allergies or rhinitis. [name], NP  Active Self, Pharmacy Records           Med Note Denyse Amass, CAROL M   Wed Jan 09, 2023  9:52 AM) PER PT TAKES PRN  FOLIC ACID PO [id number]  Take 1  tablet by mouth daily. [provider]  Active Self, Pharmacy Records  furosemide (LASIX) 20 MG tablet [id number]  TAKE 2 TABLETS (40 MG) 3 DAYS PER WEEK AND TAKE 1 TABLET (20 MG) THE OTHER DAYS OF THE WEEK  Patient taking differently: Take 20 mg by mouth daily as needed. PER PT TAKES PRN   Corky Crafts, MD  Active Self, Pharmacy Records  hydroquinone 4 % cream [id number]  Apply 1 Application topically at bedtime. [provider]  Active   hydroxypropyl methylcellulose / hypromellose (ISOPTO TEARS / GONIOVISC) 2.5 % ophthalmic solution [id number]  Place 1 drop into both eyes at bedtime. [provider]  Active Self, Pharmacy Records  latanoprost (XALATAN) 0.005 % ophthalmic solution [id number]  Place 1 drop into both eyes at bedtime. [provider]  Active Self, Pharmacy Records  levocetirizine (XYZAL) 5 MG tablet [id number]  Take 5 mg by mouth daily at 12 noon. [provider]  Active Self  levothyroxine (SYNTHROID) 50 MCG tablet [id number]  TAKE 1 TABLET EVERY DAY Georganna Skeans, MD  Active Self, Pharmacy Records  lidocaine (XYLOCAINE) 1 % (with pres) injection 1 mL [id number]   Lovorn, Aundra Millet, MD  Active   lidocaine (XYLOCAINE) 1 % (with pres) injection 3 mL [id number]   Genice Rouge, MD  Active   magnesium oxide (MAG-OX) 400 (240 Mg) MG tablet [id number]  TAKE 1 TABLET TWICE DAILY Sheilah Pigeon, PA-C  Active Self, Pharmacy Records  montelukast (SINGULAIR) 10 MG tablet [id number]  TAKE 1 TABLET AT BEDTIME Coralyn Helling, MD  Active Self, Pharmacy Records  Multiple Vitamins-Minerals (MULTIVITAMIN PO) [id number]  Take 1 tablet by mouth daily. [provider]  Active Self, Pharmacy Records  pantoprazole (PROTONIX) 40 MG tablet [id number]  TAKE 1 TABLET EVERY DAY Pyrtle, Carie Caddy, MD  Active Self, Pharmacy Records  polyethylene glycol powder [facility]) powder [id number]  Take 17 grams by mouth daily Pyrtle, Carie Caddy, MD  Active Self, Pharmacy Records            Med Note Denyse Amass, CAROL M   Wed Jan 09, 2023  9:50 AM) PER PT TAKES PRN  potassium chloride (KLOR-CON M) 10 MEQ tablet [id number]  TAKE 1/2 TABLET EVERY DAY (CALL OFFICE TO SCHEDULE YEARLY APPT) Corky Crafts, MD  Active Self, Pharmacy Records  promethazine-dextromethorphan (PROMETHAZINE-DM) 6.25-15 MG/5ML syrup [id number]  Take 5 mLs by mouth 4 (four) times daily as needed for cough. Glenford Bayley, NP  Active Self, Pharmacy Records  Respiratory Therapy Supplies [place]) [place] [id number]  1 Device by Does not apply route as needed. Ivonne Andrew, NP  Active Self, Pharmacy Records  sodium chloride HYPERTONIC 3 % nebulizer solution [id number]  Take by nebulization 2 (two) times daily as needed for cough. 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 Genice Rouge, MD  Active Self, Pharmacy Records  traZODone (DESYREL) 100 MG tablet [id number]  TAKE 1 TO  1 AND 1/2 TABLETS AT BEDTIME Lovorn, Aundra Millet, MD  Active Self, Pharmacy Records           Med Note [phone], CAROL M   Wed Jan 09, 2023  9:49 AM) PER PT TAKES PRN            Home Care and Equipment/Supplies: Were Home Health Services Ordered?: No Any new equipment or medical supplies ordered?: No  Functional Questionnaire: Do you need assistance with bathing/showering or dressing?: No Do you need assistance with meal preparation?: No Do you need assistance with eating?: No Do you have difficulty maintaining continence: No Do you need assistance with getting out of bed/getting out of a chair/moving?: No Do you have difficulty managing or taking your medications?: No  Follow up appointments reviewed: PCP Follow-up appointment confirmed?: Yes Date of PCP follow-up appointment?: 04/12/23 Follow-up Provider: Ricky Stabs, NP Specialist Hospital Follow-up appointment confirmed?: Yes Date of  Specialist follow-up appointment?: 04/11/23 Follow-Up Specialty Provider:: Dr. [name] Do you need transportation to your follow-up appointment?: No Do you understand care options if your condition(s) worsen?: Yes-patient verbalized understanding  SDOH Interventions Today    Flowsheet Row Most Recent Value  SDOH Interventions   Food Insecurity Interventions Intervention Not Indicated  Transportation Interventions Intervention Not Indicated      Jodelle Gross, RN, BSN, CCM Care Management Coordinator [facility]

## 2023-04-11 ENCOUNTER — Telehealth: Payer: Self-pay | Admitting: *Deleted

## 2023-04-11 ENCOUNTER — Ambulatory Visit (INDEPENDENT_AMBULATORY_CARE_PROVIDER_SITE_OTHER): Payer: Medicare Other | Admitting: Internal Medicine

## 2023-04-11 ENCOUNTER — Encounter: Payer: Self-pay | Admitting: Internal Medicine

## 2023-04-11 VITALS — BP 120/80 | HR 88 | Ht 66.5 in | Wt 217.8 lb

## 2023-04-11 DIAGNOSIS — G4733 Obstructive sleep apnea (adult) (pediatric): Secondary | ICD-10-CM

## 2023-04-11 DIAGNOSIS — Z09 Encounter for follow-up examination after completed treatment for conditions other than malignant neoplasm: Secondary | ICD-10-CM

## 2023-04-11 DIAGNOSIS — Z86711 Personal history of pulmonary embolism: Secondary | ICD-10-CM

## 2023-04-11 DIAGNOSIS — J84112 Idiopathic pulmonary fibrosis: Secondary | ICD-10-CM | POA: Diagnosis not present

## 2023-04-11 DIAGNOSIS — R7989 Other specified abnormal findings of blood chemistry: Secondary | ICD-10-CM

## 2023-04-11 DIAGNOSIS — Z8616 Personal history of COVID-19: Secondary | ICD-10-CM

## 2023-04-11 MED ORDER — DABIGATRAN ETEXILATE MESYLATE 150 MG PO CAPS: 150.0000 mg | ORAL_CAPSULE | Freq: Two times a day (BID) | ORAL | 2 refills | Status: DC

## 2023-04-11 NOTE — Telephone Encounter (Signed)
Addressed 04/11/2023 in [facility]

## 2023-04-11 NOTE — Progress Notes (Signed)
OV 1/4/[name] -refer by Dr. Craige Cotta to Dr. [name] at the pulmonary fibrosis center.  Subjective:  Patient ID: [name], female , DOB: 1950-09-19 , age 72 y.o. , MRN: [id number] , ADDRESS: [address] [place] [address] PCP Georganna Skeans, MD Patient Care Team: Georganna Skeans, MD as PCP - General (Family Medicine) Duke Salvia, MD as PCP - Electrophysiology (Cardiology) Corky Crafts, MD as PCP - Cardiology (Cardiology) Griselda Miner, MD as Consulting Physician (General Surgery) Malachy Mood, MD as Consulting Physician (Hematology) Lurline Hare, MD as Consulting Physician (Radiation Oncology) Donnelly Angelica, RN as Registered Nurse Pershing Proud, RN as Registered Nurse Duke Salvia, MD as Consulting Physician (Cardiology) Hubbard Hartshorn, NP (Inactive) as Nurse Practitioner (Nurse Practitioner) [name], NP as Nurse Practitioner (Nurse Practitioner) Coralyn Helling, MD as Consulting Physician (Pulmonary Disease) Griselda Miner, MD as Consulting Physician (General Surgery) Pollyann Savoy, MD as Consulting Physician (Rheumatology)  This Provider for this visit: Treatment Team:  Attending Provider: Kalman Shan, MD    1/4/[name] -   Chief Complaint  Patient presents with   Consult    Referred for ILD, states DOE     HPI Adriana [facility] [name] y.o. -referred by Dr. Craige Cotta to Dr. [name] the fibrosis center.  History is gained by review of the chart, talking to the patient in the ILD questionnaire that the patient fell.  There is no one accompanying the patient.  She has been following with Dr. Craige Cotta at least for the last 3 to 4 years for a diagnosis of bronchiectasis.  She says at baseline she is doing fairly well but then in October 2022 she suffered from COVID-19 and then was hospitalized.  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   Then a few weeks later on 06/03/2021 she was hospitalized for worsening respiratory failure which she believes was because of COVID-19 but review the records indicate that she actually had COVID in September 2022 and was treated with Paxlovid as an outpatient.  At this hospitalization the COVID was negative.  She was actually hypoxemic.  Pulmonary embolism was ruled out.  This admission RVP and COVID were normal.  Procalcitonin was normal.  Final diagnosis was acute on chronic hypoxemic respiratory failure due to bronchiectasis and pulmonary fibrosis.  She says since then she has been gradually weaned of oxygen.  She says overall she is better since the hospitalization October 2022 but she is not back to her baseline from before the hospitalization.  She has dyspnea on exertion.  Most recently November 2023 Dr. Craige Cotta did high-resolution CT scan of the chest that is concerning for honeycombing findings and progressive changes [personally visualized and I do not fully agree].  Therefore she has been referred here.  Pulmonary function testing shows still blood in DLCO but the FVC seems reduced recently.   All the question is as follows  Most recently she is use 2 L of oxygen with exertion.   [facility] Comprehensive ILD Questionnaire  Symptoms:  Other than dyspnea she also has a cough.  The cough is a since December 2023.  Although currently it is not there.   Past Medical History :  -She marked positive for COPD according to history but does not not be correct. -She is have OSA for which she is previous Dr. Craige Cotta - She does have a longstanding  history of acid reflux/hiatal hernia No history of tuberculosis -She has a history of breast cancer with radiation to the right breast in 2018 -Per the records she suffers from chronic pain syndrome with a history of right brain aneurysm clipping 2001 and cervical stenosis. -She has acid reflux disease for which she sees Dr. Rhea Belton -She has overactive  bladder -She has nonischemic cardiomyopathy for which she sees Dr. Graciela Husbands  -EF 35-40% in 2013 but as high as 50-55% in December 2022   ROS:  She has longstanding chronic pain and gait abnormality  FAMILY HISTORY of LUNG DISEASE:  She has arthralgia - She does have dry eyes -She does have heartburn -Denies family history of lung disease  PERSONAL EXPOSURE HISTORY:  -Denies family history of lung disease -She smoked between 1966 in 47 1/2 pack/day.  No marijuana no cocaine no vaping  HOME  EXPOSURE and HOBBY DETAILS :  -Her current home was built in 1973 and she has been in this house since the last 25 years.  She does use a CPAP mask.  She does use a nebulizer machine.  She does do some occasional gardening but otherwise detailed organic antigen history is negative.  OCCUPATIONAL HISTORY (122 questions) : She is to work for [employer] and then got disabled and then retired.  Detail organic and inorganic antigen history exposure at work is negative.  PULMONARY TOXICITY HISTORY (27 items):  -Did get rid Dacian to the right breast in 2018  INVESTIGATIONS: -     CT Chest data - HRCT Nov 2023 -personally visualized.  Not fully sure if that is actually UIP or not.  If that is UIP then that negative prognostic marker.  I am also not sure about progression.  With this allowed to be clarified with multidisciplinary case conference.   Narrative & Impression  CLINICAL DATA:  72 year old female with history of shortness of breath. Evaluate for interstitial lung disease.   EXAM: CT CHEST WITHOUT CONTRAST   TECHNIQUE: Multidetector CT imaging of the chest was performed following the standard protocol without intravenous contrast. High resolution imaging of the lungs, as well as inspiratory and expiratory imaging, was performed.   RADIATION DOSE REDUCTION: This exam was performed according to the departmental dose-optimization program which includes automated exposure control,  adjustment of the mA and/or kV according to patient size and/or use of iterative reconstruction technique.   COMPARISON:  CTA of the chest 08/30/2021.   FINDINGS: Cardiovascular: Heart size is normal. There is no significant pericardial fluid, thickening or pericardial calcification. There is aortic atherosclerosis, as well as atherosclerosis of the great vessels of the mediastinum and the coronary arteries, including calcified atherosclerotic plaque in the left anterior descending coronary artery. Left-sided pacemaker/AICD device in place with lead tip terminating in the right ventricular apex.   Mediastinum/Nodes: No pathologically enlarged mediastinal or hilar lymph nodes. Please note that accurate exclusion of hilar adenopathy is limited on noncontrast CT scans. Esophagus is unremarkable in appearance. No axillary lymphadenopathy.   Lungs/Pleura: High-resolution images demonstrate widespread but patchy areas of ground-glass attenuation, septal thickening, subpleural reticulation, parenchymal banding, traction bronchiectasis, peripheral bronchiolectasis and extensive honeycombing. These findings have a definitive craniocaudal gradient and are progressive compared to the prior examination from 08/30/2021. Inspiratory and expiratory imaging is unremarkable. No acute confluent consolidative airspace disease. No pleural effusions. No definite suspicious appearing pulmonary nodules or masses are noted.   Upper Abdomen: Aortic atherosclerosis. Colonic diverticulosis noted in the splenic flexure of the colon.   Musculoskeletal:  There are no aggressive appearing lytic or blastic lesions noted in the visualized portions of the skeleton.   IMPRESSION: 1. 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 2. Aortic atherosclerosis, in addition to left anterior  descending coronary artery disease. Assessment for potential risk factor modification, dietary therapy or pharmacologic therapy may be warranted, if clinically indicated. 3. Colonic diverticulosis without evidence of acute diverticulitis at this time.   Aortic Atherosclerosis (ICD10-I70.0).     Electronically Signed   By: Trudie Reed M.D.   On: 07/08/2022 10:54     OV 4/4/[name]  Subjective:  Patient ID: [name], female , DOB: [birth year] , age 56 y.o. , MRN: [id number] , ADDRESS: [address] [place] [address] PCP Georganna Skeans, MD Patient Care Team: Georganna Skeans, MD as PCP - General (Family Medicine) Duke Salvia, MD as PCP - Electrophysiology (Cardiology) Corky Crafts, MD as PCP - Cardiology (Cardiology) Griselda Miner, MD as Consulting Physician (General Surgery) Malachy Mood, MD as Consulting Physician (Hematology) Lurline Hare, MD as Consulting Physician (Radiation Oncology) Donnelly Angelica, RN as Registered Nurse Pershing Proud, RN as Registered Nurse Duke Salvia, MD as Consulting Physician (Cardiology) Hubbard Hartshorn, NP (Inactive) as Nurse Practitioner (Nurse Practitioner) [name], NP as Nurse Practitioner (Nurse Practitioner) Coralyn Helling, MD as Consulting Physician (Pulmonary Disease) Griselda Miner, MD as Consulting Physician (General Surgery) Pollyann Savoy, MD as Consulting Physician (Rheumatology)  This Provider for this visit: Treatment Team:  Attending Provider: Kalman Shan, MD    4/4/[name] -   Chief Complaint  Patient presents with   Follow-up    F/u pft 3/[name]/24. Labs,      HPI [name] 72 y.o. -returns for follow-up.  This is potential ILD workup in progress.  She states in the interim her son who is age 5 did have a heart transplant for congestive heart failure 3 weeks ago at [facility] he is home and is improving.  She is happy about that.  She also  reports that after her COVID she was prescribed Symbicort she is run out of the Symbicort for the last 2 weeks but this is not really bothering her.  She feels stable she has occasional cough.  Be here to review the results.  She had pulmonary function test that shows now a consistent pattern of decline her FVC is declined 9% in the last 4 years.  I went back and visualized all her CT scans of the chest.  Her CT scan in 2013 and a high-resolution CT chest in 2020 look about similar with some bronchiectasis.  However there is dramatic change between 2020 and also the high-resolution CT chest and late 2023.  This seems a lot more traction bronchiectasis.  There is definite craniocaudal gradient.  She has crackles here.  Initially I was not sure if this was classic UIP but clinically this makes sense especially of the lower lobe crackles.  Was supposed to discuss this in the case conference but this did not happen.  Every posted for discussion of the case conference but nevertheless his decline in PFTs and also this change now in the CT scan of the chest.  In the interim his serologies are normal.  I think at the least she has some plan for progressive ILD .  Possible is IPF but I would like to confirm this and discussion of  the case conference.  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  Went over the 2 antifibrotic's nintedanib and pirfenidone.  Shows pirfenidone because of the diarrhea side effects with nintedanib.  She has knee pain and be hard to get to the toilet.  In addition she has nonischemic cardiomyopathy and this blackbox warning for MI risk with with nintedanib.  [Even though she does not have ischemic cardiomyopathy].  Told her that pirfenidone associated more with weight loss nausea anorexia.  Both drugs required liver function monitoring.  Generally both drugs have reversible side effects.  In balance which was pirfenidone.  I  did note mild transaminitis in the past will check liver function test again today.   Xxxxxxxxx[id number]      OV 5/16/[name]  Subjective:  Patient ID: [name], female , DOB: [name]/1952 , age 57 y.o. , MRN: [id number] , ADDRESS: [address] [place] [address] PCP Georganna Skeans, MD Patient Care Team: Georganna Skeans, MD as PCP - General (Family Medicine) Duke Salvia, MD as PCP - Electrophysiology (Cardiology) Corky Crafts, MD as PCP - Cardiology (Cardiology) Griselda Miner, MD as Consulting Physician (General Surgery) Malachy Mood, MD as Consulting Physician (Hematology) Lurline Hare, MD as Consulting Physician (Radiation Oncology) Donnelly Angelica, RN as Registered Nurse Pershing Proud, RN as Registered Nurse Duke Salvia, MD as Consulting Physician (Cardiology) Hubbard Hartshorn, NP (Inactive) as Nurse Practitioner (Nurse Practitioner) [name], NP as Nurse Practitioner (Nurse Practitioner) Coralyn Helling, MD as Consulting Physician (Pulmonary Disease) Griselda Miner, MD as Consulting Physician (General Surgery) Pollyann Savoy, MD as Consulting Physician (Rheumatology)  This Provider for this visit: Treatment Team:  Attending Provider: Kalman Shan, MD    5/16/[name] -   Chief Complaint  Patient presents with   Follow-up    F/up wheezing, congestion, cough, sob with exertion.     HPI [name] 72 y.o. -returns for follow-up.  At this visit she was supposed to have started pirfenidone but what happened is that she had abnormal liver enzymes at the time of last visit.  We found that she was taking a lot of Tylenol for the knee pain.  She is now saying she did not take a lot but she actually did.  Her liver enzymes have since normalized as of April 19, [name].  She is concerned about a high total protein.  She wanted to discuss this.  I did recommend to her that she discuss this with  primary care physician.  Then she brought her back pain.  I also suggested she discuss with primary care physician.  She wants repeat liver enzymes check especially because of total protein which she believes is because of the Repatha.  Meanwhile she continues to be symptomatic.  We discussed in the case conference in the results are below.  I did share these results with the patient.     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 NOv 2023: most rcent versus Feb 2020 HRCT. In msost recent Ct  basilar predmon, ILD, Subpleural reticuation with prominent TB. There is clear honeycombing (moderate) in lung base. No air trapping. Progressed sinc2020.  C/w UIP. In 2020: read as alternate diagnosis but Dr Ashley Murrain thinks in retrospect is mild to moderate  probable or c/w UIP with mild honeycombing. The bronchiectasis all traction bronchiectasis of  fibrosis does not need bipsy. Call it IPF. Rx as IPF   We then discussed the antifibrotic's again.  We went over nintedanib or pirfenidone again.  We stuck with the original decision on taking pirfenidone and she is committing to it.  I will send a note to pharmacy.  Will check liver function test today.  7/23/[name] Acute OV : Covid 19 infection  Presents for an acute office visit.  She complains of 1 week of cough, congestion, increased shortness of breath.  She tested positive for COVID-19 on July 19, [name].  She was treated with a Z-Pak.  She has completed this in full.  Continues to have some ongoing mucus that is thick.  Denies any fever, hemoptysis, chest pain.  She remains on Symbicort twice daily. She is feeling some better. Concerned that her mucus is still thick. Has nasal congestion and drainage. Still feels weak. Appetite is okay, no n/v/d.   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 She was started on Esbriet but unable to start due to elevated liver enzymes . Was seen by GI  diagnosed with fatty liver. Has follow up next month with Dr. [name] for ILD.   Has sleep apnea on CPAP At bedtime . Does not wear oxygen at home.        OV 8/9/[name]  Subjective:  Patient ID: [name], female , DOB: 1951-04-09 , age 59 y.o. , MRN: [id number] , ADDRESS: [address] [place] [address] PCP [name], NP Patient Care Team: [name], NP as PCP - General (Nurse Practitioner) Duke Salvia, MD as PCP - Electrophysiology (Cardiology) Corky Crafts, MD as PCP - Cardiology (Cardiology) Griselda Miner, MD as Consulting Physician (General Surgery) Malachy Mood, MD as Consulting Physician (Hematology) Lurline Hare, MD as Consulting Physician (Radiation Oncology) Donnelly Angelica, RN as Registered Nurse Pershing Proud, RN as Registered Nurse Duke Salvia, MD as Consulting Physician (Cardiology) Hubbard Hartshorn, NP (Inactive) as Nurse Practitioner (Nurse Practitioner) [name], NP as Nurse Practitioner (Nurse Practitioner) Coralyn Helling, MD as Consulting Physician (Pulmonary Disease) Griselda Miner, MD as Consulting Physician (General Surgery) Pollyann Savoy, MD as Consulting Physician (Rheumatology)  This Provider for this visit: Treatment Team:  Attending Provider: Kalman Shan, MD    8/9/[name] -   Chief Complaint  Patient presents with   Acute Visit    Cough but not productive, runny nose.     HPI [name] 72 y.o. -acute visit for this IPF patient who has not been able to start antifibrotic's because of elevated liver enzymes.  Since her last visit she did see Dr. Bess Kinds who could not find an identifiable etiology for the elevated liver enzymes advised a low carbohydrate diet which she has been following.  She comes in because she is acutely ill.  She tells me that she did see Rikki Spearing July 23, [name] Milinda Pointer reviewed] had acute COVID-19..  Now she is calling acutely for  cough.  She says cough is getting worse for the last 2 days.  This headaches are some back pressure there is also shortness of breath for 2 days she feels some chills but no fever no sputum production.  Therefore she is here.  She does use oxygen at night.  In the past have tried to do excess hypoxemia test but unable to because of her knee issue.  Today she was able to do and she did desaturate to 81% on  a sit/stand test after doing it 10 times.  But I do not have a baseline to compare to.   TEST/EVENTS :  PFT 04/17/12 >> FEV1 2.60 (110%), FEV1% 85, TLC 4.05 (77%), DLCO 44% PFT 01/17/17 >> FEV1 2.38 (111%), FEV1% 90, TLC 3.85 (71%), DLCO 58%, no BD RAST 05/30/17 >> dust mites PFT 10/16/18 >> FEV1 2.27 (111%), FEV1% 90, TLC 4.00 (74%), DLCO 59% PFT 06/08/22 >> FEV1 2.20 (91%), FEV1% 93, TLC 3.76 (69%), DLCO 62%   Serology:  04/02/12 >> ACE 1 10/25/15 >> ESR 40, ACE 32 05/30/17 >> IgA, IgG, IgM normal 10/16/18 >> HP panel negative, ACE 25   Chest Imaging:  CT chest 04/10/12 >> b/l lower lobe cylindrical BTX and some in upper lobes CT angio chest 10/26/15 >> patchy GGO in periphery of lungs b/l, basilar BTX, no PE; no significant change compared to 2013 HRCT chest 10/08/18 >> atherosclerosis, basilar predominant cylindrical BTX, mild centrilobular/paraseptal emphysema, air trapping CT angio chest 03/24/19 >> Chronic lung changes with peribronchial thickening, bibasilar atelectasis and chronic interstitial disease/peripheral fibrosis at the lung bases CT angio chest 06/03/21 >> scattered GGO with fibrotic changes CT angio chest 08/30/21 >> BTX with emphysema and fibrotic changes in periphery HRCT 06/2022 -Progressive ILD-dx of UIP    Sleep Tests:  PSG 11/29/08 >> AHI 9 HST 11/10/15 >> AHI 7.1, SaO2 low 75% CPAP 06/17/21 to 07/16/21 >> used on 27 of 30 nights with average 5 hrs 24 min.  Average AHI 2.5 with CPAP 5 cm H2O.   Cardiac Tests:  Echo 08/11/21 >> EF 50 to 55%, RVSP 39.5 mmHg  PFT  OV  8/22/[name]  Subjective:  Patient ID: [name], female , DOB: 12-Jan-1951 , age 24 y.o. , MRN: [id number] , ADDRESS: [address] [place] [address] PCP [name], NP Patient Care Team: [name], NP as PCP - General (Nurse Practitioner) Duke Salvia, MD as PCP - Electrophysiology (Cardiology) Corky Crafts, MD as PCP - Cardiology (Cardiology) Griselda Miner, MD as Consulting Physician (General Surgery) Malachy Mood, MD as Consulting Physician (Hematology) Lurline Hare, MD as Consulting Physician (Radiation Oncology) Donnelly Angelica, RN as Registered Nurse Pershing Proud, RN as Registered Nurse Duke Salvia, MD as Consulting Physician (Cardiology) Hubbard Hartshorn, NP (Inactive) as Nurse Practitioner (Nurse Practitioner) [name], NP as Nurse Practitioner (Nurse Practitioner) Coralyn Helling, MD as Consulting Physician (Pulmonary Disease) Griselda Miner, MD as Consulting Physician (General Surgery) Pollyann Savoy, MD as Consulting Physician (Rheumatology)  This Provider for this visit: Treatment Team:  Attending Provider: Kalman Shan, MD    8/22/[name] -   Chief Complaint  Patient presents with   Follow-up    F/up on hosp visit, IPF and blood work   #IPF patient unable to start pirfenidone because of previous elevations in liver enzymes deemed as fatty liver by GI  #Outpatient COVID-19 in July [name] followed by pulmonary embolism mid August [name].  [Previous history of VTE]  #Sleep apnea in the process of transfer from Dr. Craige Cotta to Dr. Val Eagle  HPI [name] 72 y.o. -returns for follow-up.  She was seen acutely in July 2020 for outpatient COVID then in August [name] she came in acutely what look like bronchitis ended up turning out to be pulmonary embolism.  She did get admitted.  She has been discharged on Pradaxa because Eliquis was expensive.  She is using oxygen 2 L nasal cannula she feels better but he  does  get tachycardic.  She is worried about the PE.  She is worried about the duration of Pradaxa and the cost associated with it and previous bleeding risk she says she had a bleeding episode 20 years ago.  I did indicate to her that potentially lifelong treatment because a second episode versus at least taking for a year and then reassessing.  In terms of IPF LFTs most recently mid August [name] are normal.  She is agreed to start pirfenidone.  We did a sit/stand hypoxemia test and she did desaturate again.  I did indicate to her this might not be the PE make her desaturate but the IPF.  She asked about transplant I did indicate to her the 2 is at the upper limit plus she has got obesity.  Did indicate to her that we need to make progress with weight loss and then subsequently we went with the basics of IPF management and then we can decide.  She is using nighttime oxygen on her own.  But is never been tested.  Sleep study was ordered before she saw Dr. Val Eagle but because of illness this was canceled.  We will reorder this.   SYMPTOM SCALE - ILD 1/4/[name] 4/4/[name]   Current weight    O2 use x ra  Shortness of Breath 0 -> 5 scale with 5 being worst (score 6 If unable to do)   At rest 0 2  Simple tasks - showers, clothes change, eating, shaving 2 3  Household (dishes, doing bed, laundry) 1 4  Shopping 1 3  Walking level at own pace 2 3  Walking up Stairs 3 5  Total (30-36) Dyspnea Score 9 20    Non-dyspnea symptoms (0-> 5 scale) 1/4/[name] 5/16/[name]   How bad is your cough? 0 3  How bad is your fatigue 0 2  How bad is nausea 00 0  How bad is vomiting?  0 0  How bad is diarrhea? 0 0  How bad is anxiety? 2 4  How bad is depression 2 5  Any chronic pain - if so where and how bad 1 x do spirometry and   Simple office walk 185 feet x  3 laps goal with forehead probe 4/4/[name] Could not do due to Rt Knee pain 8/9/[name] - acute PE  8/22/[name] pradaxa  O2 used x ra ra  Number laps completed x Sit stna x  10 Sit stand x 10  Comments about pace x    Resting Pulse Ox/HR x% and x/min 92% and HR 107 98% and HR 111  Final Pulse Ox/HR x% and x/min 81% and HR 121 85-88% and HR 118  Desaturated </= 88% x yes   Desaturated <= 3% points x yes   Got Tachycardic >/= 90/min x es   Symptoms at end of test x Level 6 dyspnea Level 5 dyspnea  Miscellaneous comments xx     PFT     Latest Ref Rng & Units 7/2/[name]   11:26 AM 1/5/[name]    1:55 PM 06/08/2022    3:49 PM 10/16/2018   12:55 PM 01/17/2017    2:50 PM  ILD indicators  FVC-Pre L 2.25  2.30  2.33  2.52  2.63   FVC-Predicted Pre % 71  72  73  96  98   FVC-Post L   2.36  2.49  2.52   FVC-Predicted Post %   74  94  94   TLC L   3.76  4.00  3.85   TLC Predicted %   69  74  71   DLCO uncorrected ml/min/mmHg 11.85  11.05  12.95  12.43  15.90   DLCO [facility] %Pred % 56  52  62  59  58   DLCO Corrected ml/min/mmHg 11.85  11.05  12.95   15.80   DLCO COR %Pred % 56  52  62   57       LAB RESULTS last 96 hours No results found.  LAB RESULTS last 90 days Recent Results (from the past 2160 hour(s))  Hepatic function panel     Status: Abnormal   Collection Time: 01/17/23  3:02 PM  Result Value Ref Range   Total Bilirubin 0.3 0.2 - 1.2 mg/dL   Bilirubin, Direct 0.0 0.0 - 0.3 mg/dL   Alkaline Phosphatase 137 (H) 39 - 117 U/L   AST 43 (H) 0 - 37 U/L   ALT 49 (H) 0 - 35 U/L   Total Protein 7.8 6.0 - 8.3 g/dL   Albumin 4.3 3.5 - 5.2 g/dL  Pulmonary function test     Status: None   Collection Time: 02/19/23 11:26 AM  Result Value Ref Range   FVC-Pre 2.25 L   FVC-%Pred-Pre 71 %   FEV1-Pre 1.97 L   FEV1-%Pred-Pre 82 %   FEV6-Pre 2.25 L   FEV6-%Pred-Pre 74 %   Pre FEV1/FVC ratio 88 %   FEV1FVC-%Pred-Pre 116 %   Pre FEV6/FVC Ratio 100 %   FEV6FVC-%Pred-Pre 104 %   FEF 25-75 Pre 1.91 L/sec   FEF2575-%Pred-Pre 103 %   DLCO unc 11.85 ml/min/mmHg   DLCO unc % pred 56 %   DLCO cor 11.85 ml/min/mmHg   DLCO cor % pred 56 %   DL/VA 1.61 ml/min/mmHg/L    DL/VA % pred 94 %  Hepatic function panel     Status: Abnormal   Collection Time: 02/22/23  2:53 PM  Result Value Ref Range   Total Bilirubin 0.4 0.2 - 1.2 mg/dL   Bilirubin, Direct 0.1 0.0 - 0.3 mg/dL   Alkaline Phosphatase 113 39 - 117 U/L   AST 39 (H) 0 - 37 U/L   ALT 54 (H) 0 - 35 U/L   Total Protein 7.3 6.0 - 8.3 g/dL   Albumin 4.1 3.5 - 5.2 g/dL  [id number]     Status: None   Collection Time: 02/22/23  2:53 PM  Result Value Ref Range   A-1 Antitrypsin, Ser 125 83 - 199 mg/dL  CK     Status: Abnormal   Collection Time: 02/22/23  2:53 PM  Result Value Ref Range   Total CK 215 (H) 7 - 177 U/L  Mitochondrial antibodies     Status: None   Collection Time: 02/22/23  2:53 PM  Result Value Ref Range   Mitochondrial M2 Ab, IgG <=20.0 <=20.0 U    Comment: . Reference Range:    Negative:  <=20.0      U    Equivocal: 20.1 - 24.9 U    Positive:  >=25.0      U .   Anti-smooth muscle antibody, IgG     Status: None   Collection Time: 02/22/23  2:53 PM  Result Value Ref Range   Actin (Smooth Muscle) Antibody (IGG) <20 <20 U    Comment: . Reference Range:    <20 U: Negative >or=20 U: Positive . [name] Antibodies recognizing actin are the main component of smooth muscle antibodies associated with auto- immune liver disease. Actin  antibodies are found in approximately 75% of patients with autoimmune hepatitis (AIH) type 1, approximately 65% of patients with autoimmune cholangitis, approximately 30% of patients with primary biliary cirrhosis and approximately 2% of healthy controls. High values are closely correlated with AIH type 1. .   Hepatitis B core antibody, total     Status: None   Collection Time: 02/22/23  2:53 PM  Result Value Ref Range   Hep B Core Total Ab NON-REACTIVE NON-REACTIVE    Comment: . For additional information, please refer to  http://education.quest[facility].com/faq/FAQ202  (This link is being provided for informational/ educational  purposes only.) .   Hepatitis B surface antigen     Status: None   Collection Time: 02/22/23  2:53 PM  Result Value Ref Range   Hepatitis B Surface Ag NON-REACTIVE NON-REACTIVE    Comment: . For additional information, please refer to  http://education.[facility].com/faq/FAQ202  (This link is being provided for informational/ educational purposes only.) .   CUP PACEART REMOTE DEVICE CHECK     Status: None   Collection Time: 02/27/23  8:26 AM  Result Value Ref Range   Date Time Interrogation Session [id number]    Pulse Generator Manufacturer BOST    Pulse Gen Model E102 TELIGEN 100    Pulse Gen Serial Number [id number]    Clinic [facility]    Implantable Pulse Generator Type Implantable Cardiac Defibulator    Implantable Pulse Generator Implant Date [id number]    Implantable Lead Manufacturer GUIC    Implantable Lead Model 0185 [place] Reliance G    Implantable Lead Serial Number [id number]    Implantable Lead Implant Date [id number]    Implantable Lead Location Detail 1 APEX    Implantable Lead Location [id number]    Implantable Lead Connection Status [id number]    Lead Channel Setting Sensing Sensitivity 0.4 mV   Lead Channel Setting Sensing Adaptation Mode Adaptive Sensing    Lead Channel Setting Pacing Pulse Width 0.4 ms   Lead Channel Setting Pacing Amplitude 2.4 V   Zone Setting Status Active    Zone Setting Status Active    Zone Setting Status [phone]    Lead Channel Impedance Value 623 ohm   Lead Channel Pacing Threshold Amplitude 0.8 V   Lead Channel Pacing Threshold Pulse Width 0.4 ms   HighPow Impedance 77 ohm   Battery Status MOS    Battery Remaining Longevity 12 mo   Battery Remaining Percentage 14 %   Brady Statistic RV Percent Paced 0 %  POC COVID-19     Status: Abnormal   Collection Time: 03/06/23  3:44 PM  Result Value Ref Range   SARS Coronavirus 2 Ag Positive (A) Negative  Troponin T, High Sensitivity (hs-TnT)     Status: Abnormal   Collection Time:  03/29/23  1:33 PM  Result Value Ref Range   Troponin T (Highly Sensitive) 42 (HH) <15 ng/L    Comment: Results checked and verified.  Relative Risk: Optimal <6 ng/L; Moderate: Males 6-22 ng/L, Females 6-14 ng/L; High: Males: >22 ng/L, Females: >14 ng/L. Reference Range: Males <23 ng/L, Females <15 ng/L. High Sensitivity Troponin T (hs-TnT) levels exceeding the gender-specific 99th percentile upper reference limit (males >22 ng/L, females >14 ng/L) may indicate a recent acute myocardial infarction however, hs-TnT results should always be assessed in conjunction with the patients medical history, clinical examination, symptoms of cardiac ischemia, electrocardiogram results, and/or other cardiovascular disease (CVD) diagnostic findings. Elevations in hs-TnT can also be observed in other heart conditions. To distinguish between  acute and chronic hs-TnT elevations, serial sampling and clinical correlation is recommended for interpretation. There is literature supporting any hs-TnT >=6 ng/L confers increased CVD relative risk (Oluleye OW, et al. Dewayne Hatch Epidemiol. 2013;23(2):66- 73; Seliger SL, et al. Circulation. 2017;135(16):1494-1505). For additional information, please refer to [employer] (this link is being provided for informational/educational purposes only).    Hepatic function panel     Status: Abnormal   Collection Time: 03/29/23  1:33 PM  Result Value Ref Range   Total Bilirubin 0.6 0.2 - 1.2 mg/dL   Bilirubin, Direct 0.1 0.0 - 0.3 mg/dL   Alkaline Phosphatase 125 (H) 39 - 117 U/L   AST [name] 0 - 37 U/L   ALT 33 0 - 35 U/L   Total Protein 7.5 6.0 - 8.3 g/dL   Albumin 4.0 3.5 - 5.2 g/dL  D-Dimer, Quantitative     Status: Abnormal   Collection Time: 03/29/23  1:33 PM  Result Value Ref Range   D-Dimer, Quant 5.59 (H) <0.50 mcg/mL FEU    Comment: . The D-Dimer test is used frequently to exclude an acute PE or DVT. In patients with a low  to moderate clinical risk assessment and a D-Dimer result <0.50 mcg/mL FEU, the likelihood of a PE or DVT is very low. However, a thromboembolic event should not be excluded solely on the basis of the D-Dimer level. Increased levels of D-Dimer are associated with a PE, DVT, DIC, malignancies, inflammation, sepsis, surgery, trauma, pregnancy, and advancing patient age. [Jama 2006 11:295(2):199-207] . For additional information, please refer to: http://education.quest[facility].com/faq/FAQ149 (This link is being provided for informational/ educational purposes only) .   CBC w/Diff     Status: Abnormal   Collection Time: 03/29/23  1:33 PM  Result Value Ref Range   WBC 8.1 4.0 - 10.5 K/uL   RBC 4.69 3.87 - 5.11 Mil/uL   Hemoglobin 14.2 12.0 - 15.0 g/dL   HCT 82.9 56.2 - 13.0 %   MCV 93.5 78.0 - 100.0 fl   MCHC 32.3 30.0 - 36.0 g/dL   RDW 86.5 78.4 - 69.6 %   Platelets 190.0 150.0 - 400.0 K/uL   Neutrophils Relative % 79.1 (H) 43.0 - 77.0 %   Lymphocytes Relative 8.3 (L) 12.0 - 46.0 %   Monocytes Relative 10.0 3.0 - 12.0 %   Eosinophils Relative 1.8 0.0 - 5.0 %   Basophils Relative 0.8 0.0 - 3.0 %   Neutro Abs 6.4 1.4 - 7.7 K/uL   Lymphs Abs 0.7 0.7 - 4.0 K/uL   Monocytes Absolute 0.8 0.1 - 1.0 K/uL   Eosinophils Absolute 0.1 0.0 - 0.7 K/uL   Basophils Absolute 0.1 0.0 - 0.1 K/uL  Basic Metabolic Panel (BMET)     Status: Abnormal   Collection Time: 03/29/23  1:33 PM  Result Value Ref Range   Sodium 134 (L) 135 - 145 mEq/L   Potassium 4.0 3.5 - 5.1 mEq/L   Chloride 103 96 - 112 mEq/L   CO2 22 19 - 32 mEq/L   Glucose, Bld 103 (H) 70 - 99 mg/dL   BUN 10 6 - 23 mg/dL   Creatinine, Ser 2.95 0.40 - 1.20 mg/dL   GFR [name].41 >32.44 mL/min    Comment: Calculated using the CKD-EPI Creatinine Equation (2021)   Calcium 9.9 8.4 - 10.5 mg/dL  B Nat Peptide     Status: None   Collection Time: 03/29/23  1:33 PM  Result Value Ref Range   Pro B Natriuretic peptide (BNP) 84.0 0.0 -  100.0  pg/mL  CBC with Differential     Status: Abnormal   Collection Time: 04/02/23  2:54 PM  Result Value Ref Range   WBC 6.3 4.0 - 10.5 K/uL   RBC 4.63 3.87 - 5.11 MIL/uL   Hemoglobin 14.2 12.0 - 15.0 g/dL   HCT 16.1 09.6 - 04.5 %   MCV 95.7 80.0 - 100.0 fL   MCH 30.7 26.0 - 34.0 pg   MCHC 32.1 30.0 - 36.0 g/dL   RDW 40.9 81.1 - 91.4 %   Platelets 212 150 - 400 K/uL   nRBC 0.0 0.0 - 0.2 %   Neutrophils Relative % 92 %   Neutro Abs 5.8 1.7 - 7.7 K/uL   Lymphocytes Relative 5 %   Lymphs Abs 0.3 (L) 0.7 - 4.0 K/uL   Monocytes Relative 2 %   Monocytes Absolute 0.2 0.1 - 1.0 K/uL   Eosinophils Relative 0 %   Eosinophils Absolute 0.0 0.0 - 0.5 K/uL   Basophils Relative 0 %   Basophils Absolute 0.0 0.0 - 0.1 K/uL   Immature Granulocytes 1 %   Abs Immature Granulocytes 0.03 0.00 - 0.07 K/uL    Comment: Performed at [facility], [address], [place], [place] [address]  Comprehensive metabolic panel     Status: Abnormal   Collection Time: 04/02/23  2:54 PM  Result Value Ref Range   Sodium 137 135 - 145 mmol/L   Potassium 4.5 3.5 - 5.1 mmol/L   Chloride 105 98 - 111 mmol/L   CO2 26 22 - 32 mmol/L   Glucose, Bld 132 (H) 70 - 99 mg/dL    Comment: Glucose reference range applies only to samples taken after fasting for at least 8 hours.   BUN 12 8 - 23 mg/dL   Creatinine, Ser 6.21 0.44 - 1.00 mg/dL   Calcium 9.5 8.9 - 30.8 mg/dL   Total Protein 7.5 6.5 - 8.1 g/dL   Albumin 3.7 3.5 - 5.0 g/dL   AST 27 15 - 41 U/L   ALT 35 0 - 44 U/L   Alkaline Phosphatase 109 38 - 126 U/L   Total Bilirubin 0.5 0.3 - 1.2 mg/dL   GFR, Estimated >65 >78 mL/min    Comment: (NOTE) Calculated using the CKD-EPI Creatinine Equation (2021)    Anion gap 6 5 - 15    Comment: Performed at [facility] [address], [place], [place] [address]  D-dimer, quantitative     Status: Abnormal   Collection Time: 04/02/23  2:54 PM  Result Value Ref Range   D-Dimer, Quant 6.22  (H) 0.00 - 0.50 ug/mL-FEU    Comment: (NOTE) At the manufacturer cut-off value of 0.5 g/mL FEU, this assay has a negative predictive value of 95-100%.This assay is intended for use in conjunction with a clinical pretest probability (PTP) assessment model to exclude pulmonary embolism (PE) and deep venous thrombosis (DVT) in outpatients suspected of PE or DVT. Results should be correlated with clinical presentation. Performed at [facility], [address] [place], [place] [address]   Troponin I (High Sensitivity)     Status: None   Collection Time: 04/02/23  2:54 PM  Result Value Ref Range   Troponin I (High Sensitivity) 7 <18 ng/L    Comment: (NOTE) Elevated high sensitivity troponin I (hsTnI) values and significant  changes across serial measurements may suggest ACS but many other  chronic and acute conditions are known to elevate hsTnI results.  Refer to the "Links" section for chest pain algorithms and additional  guidance. Performed at [facility], [address], [place], [place] [address]   Brain natriuretic peptide     Status: None   Collection Time: 04/02/23  2:54 PM  Result Value Ref Range   B Natriuretic Peptide 63.8 0.0 - 100.0 pg/mL    Comment: Performed at [facility], [address], [place], [place] [address]  Troponin I (High Sensitivity)     Status: None   Collection Time: 04/02/23  4:50 PM  Result Value Ref Range   Troponin I (High Sensitivity) 8 <18 ng/L    Comment: (NOTE) Elevated high sensitivity troponin I (hsTnI) values and significant  changes across serial measurements may suggest ACS but many other  chronic and acute conditions are known to elevate hsTnI results.  Refer to the "Links" section for chest pain algorithms and additional  guidance. Performed at [facility], [address] [place], [place] [address]   Basic metabolic panel     Status: Abnormal    Collection Time: 04/03/23  3:37 AM  Result Value Ref Range   Sodium 140 135 - 145 mmol/L   Potassium 3.8 3.5 - 5.1 mmol/L   Chloride 105 98 - 111 mmol/L   CO2 [name] 22 - 32 mmol/L   Glucose, Bld 100 (H) 70 - 99 mg/dL    Comment: Glucose reference range applies only to samples taken after fasting for at least 8 hours.   BUN 14 8 - 23 mg/dL   Creatinine, Ser 2.84 0.44 - 1.00 mg/dL   Calcium 9.6 8.9 - 13.2 mg/dL   GFR, Estimated >44 >01 mL/min    Comment: (NOTE) Calculated using the CKD-EPI Creatinine Equation (2021)    Anion gap 7 5 - 15    Comment: Performed at [facility], [address] [place], [place] [address]  Heparin level (unfractionated)     Status: Abnormal   Collection Time: 04/03/23  3:37 AM  Result Value Ref Range   Heparin Unfractionated 0.86 (H) 0.30 - 0.70 IU/mL    Comment: (NOTE) The clinical reportable range upper limit is being lowered to >1.10 to align with the FDA approved guidance for the current laboratory assay.  If heparin results are below expected values, and patient dosage has  been confirmed, suggest follow up testing of antithrombin III levels. Performed at [facility], [address], [place], [place] [address]   CBC     Status: None   Collection Time: 04/03/23  3:37 AM  Result Value Ref Range   WBC 6.1 4.0 - 10.5 K/uL   RBC 4.19 3.87 - 5.11 MIL/uL   Hemoglobin 12.7 12.0 - 15.0 g/dL   HCT 03.4 74.2 - 59.5 %   MCV 94.3 80.0 - 100.0 fL   MCH 30.3 26.0 - 34.0 pg   MCHC 32.2 30.0 - 36.0 g/dL   RDW 63.8 75.6 - 43.3 %   Platelets 199 150 - 400 K/uL   nRBC 0.0 0.0 - 0.2 %    Comment: Performed at [facility], [address], [place], [place] [address]  Heparin level (unfractionated)     Status: Abnormal   Collection Time: 04/03/23 12:19 PM  Result Value Ref Range   Heparin Unfractionated >1.10 (H) 0.30 - 0.70 IU/mL    Comment: (NOTE) The clinical reportable range upper limit is being  lowered to >1.10 to align with the FDA approved  guidance for the current laboratory assay.  If heparin results are below expected values, and patient dosage has  been confirmed, suggest follow up testing of antithrombin III levels. Performed at [facility], [address], [place], [place] [address]   ECHOCARDIOGRAM COMPLETE     Status: None   Collection Time: 04/03/23  2:46 PM  Result Value Ref Range   Weight 3,456.81 oz   Height 66.5 in   BP 123/74 mmHg   Single Plane A2C EF 47.6 %   Single Plane A4C EF 46.1 %   Calc EF 46.8 %   S' Lateral 3.80 cm   Area-P 1/2 4.31 cm2   MV M vel 2.53 m/s   MV Peak grad 25.6 mmHg   Est EF 40 - 45%          has a past medical history of AICD (automatic cardioverter/defibrillator) present, Allergy (09/20/1994), Anxiety, Arthritis, Back pain, Breast cancer (HCC) (2016), Bronchiectasis (HCC), Cerebral aneurysm, nonruptured, CHF (congestive heart failure) (HCC), Clostridium difficile infection, Depressive disorder, not elsewhere classified, Diverticulosis of colon (without mention of hemorrhage), Esophageal reflux, Fatty liver, Fibromyalgia, Gastritis, GERD (gastroesophageal reflux disease), GI bleed (2004), Glaucoma, Hiatal hernia, History of COVID-19 (05/04/2021), Hyperlipidemia, Hypertension, Hypothyroidism, Internal hemorrhoids, Joint pain, Multifocal pneumonia (06/03/2021), Obstructive sleep apnea (adult) (pediatric), Osteoarthritis, Ostium secundum type atrial septal defect, Other chronic nonalcoholic liver disease, Other pulmonary embolism and infarction, Palpitations, Paroxysmal ventricular tachycardia (HCC), Personal history of radiation therapy, PONV (postoperative nausea and vomiting), Presence of permanent cardiac pacemaker, PUD (peptic ulcer disease), Radiation (02/03/15-03/10/15), Sarcoid, Schatzki's ring, Shortness of breath, Sleep apnea, Stroke (HCC) (2013), Takotsubo syndrome, Tubular adenoma of colon, Unspecified transient  cerebral ischemia, and Unspecified vitamin D deficiency.   reports that she quit smoking about 33 years ago. Her smoking use included cigarettes. She started smoking about 53 years ago. She has a 20 pack-year smoking history. She has been exposed to tobacco smoke. She has never used smokeless tobacco.  Past Surgical History:  Procedure Laterality Date   ABDOMINAL HYSTERECTOMY  11/22/1998   ABI  2006   normal   BREAST EXCISIONAL BIOPSY     BREAST LUMPECTOMY Right 2016   BREAST LUMPECTOMY WITH RADIOACTIVE SEED LOCALIZATION Right 01/03/2015   Procedure: BREAST LUMPECTOMY WITH RADIOACTIVE SEED LOCALIZATION;  Surgeon: Chevis Pretty III, MD;  Location: [facility] OR;  Service: General;  Laterality: Right;   CARDIAC DEFIBRILLATOR PLACEMENT  2006; 2012   BSX single chamber ICD   carotid dopplers  2006   neg   CEREBRAL ANEURYSM REPAIR  02/1999   COLONOSCOPY     EYE SURGERY     HAMMER TOE SURGERY  11/15/2020   3rd digit bilateral feet    hospitalization  2004   GI bleed, PUD, diverticulosis (EGD,colonscopy)   hospitalization     PE, NSVT, s/p defib   JOINT REPLACEMENT  05/2021   KNEE ARTHROSCOPY     bilateral   LEFT HEART CATHETERIZATION WITH CORONARY ANGIOGRAM N/A 02/22/2012   Procedure: LEFT HEART CATHETERIZATION WITH CORONARY ANGIOGRAM;  Surgeon: Kathleene Hazel, MD;  Location: [facility] CATH LAB;  Service: Cardiovascular;  Laterality: N/A;   PARTIAL HYSTERECTOMY     Fibroids   TONGUE BIOPSY  12/12/2017   due to sore tongue and white patches/abnormal cells   TOTAL KNEE ARTHROPLASTY Left 02/13/2016   Procedure: TOTAL KNEE ARTHROPLASTY;  Surgeon: Dannielle Huh, MD;  Location: [facility] OR;  Service: Orthopedics;  Laterality: Left;   TOTAL KNEE ARTHROPLASTY Right 05/22/2021   Procedure: TOTAL  KNEE ARTHROPLASTY;  Surgeon: Dannielle Huh, MD;  Location: [facility] ORS;  Service: Orthopedics;  Laterality: Right;  with block   TUBAL LIGATION  09/20/1978   UPPER GASTROINTESTINAL ENDOSCOPY      Allergies  Allergen  Reactions   Ace Inhibitors Swelling    Angioedema; makes tongue "break out"    Amitriptyline Hcl Other (See Comments)     makes her too sleepy!   Atorvastatin Other (See Comments)    SEVERE MYALGIA   Dilantin [Phenytoin Sodium Extended] Rash    Severe rash   Duloxetine Hcl Nausea Only and Other (See Comments)    Sleepiness/ sick  Other Reaction(s): GI Intolerance   Paroxetine Nausea Only    Rapid heartbeat   Ramipril Other (See Comments)    TONGUE ULCERS    Rosuvastatin Other (See Comments)    SEVERE MYALGIA   Carbamazepine Rash   Codeine Itching   Phenytoin Sodium Extended Rash   Simvastatin Other (See Comments)    Increase in CK, myalgias   Wellbutrin [Bupropion] Palpitations    Immunization History  Administered Date(s) Administered   Fluad Quad(high Dose 65+) 08/[name]/2020, 04/30/2020   H1N1 07/24/2008   Influenza Split 07/17/2011, 09/27/2011, 04/29/2012   Influenza Whole 07/20/2004, 07/04/2007, 05/07/2008, 05/19/2009, 09/[name]/2011   Influenza, High Dose Seasonal PF 05/30/2017, 05/21/2018   Influenza,inj,Quad PF,6+ Mos 05/20/2013, 05/06/2014, 05/17/2015, 05/15/2016, 04/27/2021   Influenza-Unspecified 05/07/2018   PFIZER(Purple Top)SARS-COV-2 Vaccination 09/25/2019, 10/16/2019, 06/04/2020, 01/04/2021, 08/06/2021   Pfizer Covid-19 Vaccine Bivalent Booster 80yrs & up 05/07/2022   Pneumococcal Conjugate-13 11/22/2015, 08/06/2021   Pneumococcal Polysaccharide-23 05/20/2002, 05/07/2008, 06/25/2014   Respiratory Syncytial Virus Vaccine,Recomb Aduvanted(Arexvy) 06/26/2022   Td 09/07/2009   Zoster, Live 09/21/2011    Family History  Problem Relation Age of Onset   Diabetes Mother    Alzheimer's disease Mother    Hypertension Mother    Obesity Mother    Arthritis Mother    Heart disease Father    Seizures Sister    Allergies Sister    Heart disease Sister    Early death Sister    Other Brother        Thyroid problem 10/2016   Hearing loss Brother    Vision loss  Brother    Heart disease Brother    Prostate cancer Brother 7       same brother as throat cancer   Throat cancer Brother        dx in his 44s; also a smoker   Arthritis Brother    Hypertension Brother    Cancer Maternal Grandmother 90       colon cancer or abdominal cancer   Lung cancer Maternal Grandfather 78   Heart disease Son        Cardiac Arrest 07/2016   Breast cancer Maternal Aunt 55   Colon cancer Maternal Aunt 61       same sister as breast at 41   Breast cancer Maternal Aunt        dx in her 13s   Lung cancer Maternal Uncle    Pancreatic cancer Cousin 11       maternal first cousin   Breast cancer Cousin        paternal first cousin twice removed died in her 48s   Anxiety disorder Sister    Arthritis Sister    Depression Sister    Hypertension Sister    Stroke Neg Hx      Current Outpatient Medications:    albuterol (VENTOLIN HFA) 108 (90  Base) MCG/ACT inhaler, Inhale 2 puffs into the lungs every 6 (six) hours as needed for wheezing or shortness of breath., Disp: 18 g, Rfl: 3   azelastine (ASTELIN) 0.1 % nasal spray, Place 1 spray into both nostrils 2 (two) times daily. Use in each nostril as directed, Disp: 90 mL, Rfl: 3   benzonatate (TESSALON) 200 MG capsule, Take 1 capsule (200 mg total) by mouth 3 (three) times daily as needed for cough., Disp: 90 capsule, Rfl: 2   budesonide-formoterol (SYMBICORT) 80-4.5 MCG/ACT inhaler, Inhale 1 puff into the lungs 2 (two) times daily., Disp: 1 each, Rfl: 12   Cholecalciferol (DIALYVITE VITAMIN D 5000) 125 MCG (5000 UT) capsule, Take 5,000 Units by mouth daily., Disp: , Rfl:    cloBAZam (ONFI) 10 MG tablet, Take 1 tablet (10 mg total) by mouth 2 (two) times daily., Disp: 180 tablet, Rfl: 1   dabigatran (PRADAXA) 150 MG CAPS capsule, Take 1 capsule (150 mg total) by mouth 2 (two) times daily., Disp: 60 capsule, Rfl: 2   diltiazem (CARDIZEM CD) 120 MG 24 hr capsule, Take 1 capsule (120 mg total) by mouth daily., Disp: 90  capsule, Rfl: 3   empagliflozin (JARDIANCE) 10 MG TABS tablet, Take 1 tablet (10 mg total) by mouth daily., Disp: 90 tablet, Rfl: 3   Evolocumab (REPATHA SURECLICK) 140 MG/ML SOAJ, Inject 140 mg into the skin every 14 (fourteen) days., Disp: 6 mL, Rfl: 3   famotidine (PEPCID) 40 MG tablet, Take 1 tablet (40 mg total) by mouth at bedtime. NEEDS APPT FOR FURTHER REFILLS, Disp: 90 tablet, Rfl: 6   fexofenadine (ALLEGRA) 180 MG tablet, Take 180 mg by mouth daily as needed for allergies (alternates with zyrtec sometimes)., Disp: , Rfl:    fluticasone (FLONASE) 50 MCG/ACT nasal spray, Place 2 sprays into both nostrils daily as needed for allergies or rhinitis., Disp: 48 g, Rfl: 3   FOLIC ACID PO, Take 1 tablet by mouth daily., Disp: , Rfl:    furosemide (LASIX) 20 MG tablet, TAKE 2 TABLETS (40 MG) 3 DAYS PER WEEK AND TAKE 1 TABLET (20 MG) THE OTHER DAYS OF THE WEEK (Patient taking differently: Take 20 mg by mouth daily as needed. PER PT TAKES PRN), Disp: 130 tablet, Rfl: 1   hydroquinone 4 % cream, Apply 1 Application topically at bedtime., Disp: , Rfl:    hydroxypropyl methylcellulose / hypromellose (ISOPTO TEARS / GONIOVISC) 2.5 % ophthalmic solution, Place 1 drop into both eyes at bedtime., Disp: , Rfl:    latanoprost (XALATAN) 0.005 % ophthalmic solution, Place 1 drop into both eyes at bedtime., Disp: , Rfl:    levocetirizine (XYZAL) 5 MG tablet, Take 5 mg by mouth daily at 12 noon., Disp: , Rfl:    levothyroxine (SYNTHROID) 50 MCG tablet, TAKE 1 TABLET EVERY DAY, Disp: 90 tablet, Rfl: 3   magnesium oxide (MAG-OX) 400 (240 Mg) MG tablet, TAKE 1 TABLET TWICE DAILY, Disp: 180 tablet, Rfl: 3   montelukast (SINGULAIR) 10 MG tablet, TAKE 1 TABLET AT BEDTIME, Disp: 90 tablet, Rfl: 3   Multiple Vitamins-Minerals (MULTIVITAMIN PO), Take 1 tablet by mouth daily., Disp: , Rfl:    pantoprazole (PROTONIX) 40 MG tablet, TAKE 1 TABLET EVERY DAY, Disp: 90 tablet, Rfl: 3   polyethylene glycol powder  (GLYCOLAX/MIRALAX) powder, Take 17 grams by mouth daily, Disp: 1080 g, Rfl: 0   potassium chloride (KLOR-CON M) 10 MEQ tablet, TAKE 1/2 TABLET EVERY DAY (CALL OFFICE TO SCHEDULE YEARLY APPT), Disp: 45 tablet, Rfl: 3  promethazine-dextromethorphan (PROMETHAZINE-DM) 6.25-15 MG/5ML syrup, Take 5 mLs by mouth 4 (four) times daily as needed for cough., Disp: 118 mL, Rfl: 0   Respiratory Therapy Supplies (FLUTTER) DEVI, 1 Device by Does not apply route as needed., Disp: 1 each, Rfl: 0   sodium chloride HYPERTONIC 3 % nebulizer solution, Take by nebulization 2 (two) times daily as needed for cough. 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, Disp: 750 mL, Rfl: 12   tiZANidine (ZANAFLEX) 4 MG tablet, Take 0.5-1 tablets (2-4 mg total) by mouth 2 (two) times daily., Disp: 180 tablet, Rfl: 1   traZODone (DESYREL) 100 MG tablet, TAKE 1 TO 1 AND 1/2 TABLETS AT BEDTIME, Disp: 135 tablet, Rfl: 3   enoxaparin (LOVENOX) 100 MG/ML injection, Inject 1 mL (100 mg total) into the skin every 12 (twelve) hours for 5 days., Disp: 10 mL, Rfl: 0  Current Facility-Administered Medications:    lidocaine (XYLOCAINE) 1 % (with pres) injection 1 mL, 1 mL, Other, Once, Lovorn, Megan, MD   lidocaine (XYLOCAINE) 1 % (with pres) injection 3 mL, 3 mL, Other, Once, Lovorn, Megan, MD      Objective:   Vitals:   04/11/23 1355  BP: 120/80  Pulse: 88  SpO2: 98%  Weight: 217 lb 12.8 oz (98.8 kg)  Height: 5' 6.5" (1.689 m)    Estimated body mass index is 34.63 kg/m as calculated from the following:   Height as of this encounter: 5' 6.5" (1.689 m).   Weight as of this encounter: 217 lb 12.8 oz (98.8 kg).  @WEIGHTCHANGE @  [employer]   04/11/23 1355  Weight: 217 lb 12.8 oz (98.8 kg)     Physical Exam   General: No distress. obese O2 at rest: no Cane present: no Sitting in wheel chair: no Frail: no Obese: no Neuro: Alert and Oriented x 3. GCS 15. Speech normal Psych: Pleasant Resp:  Barrel Chest - no.  Wheeze - no, Crackles - YES,  No overt respiratory distress CVS: Normal heart sounds. Murmurs - no Ext: Stigmata of Connective Tissue Disease - no HEENT: Normal upper airway. PEERL +. No post nasal drip        Assessment:       ICD-10-CM   1. IPF (idiopathic pulmonary fibrosis) (HCC)  J84.112     2. Abnormal LFTs  R79.89     3. History of 2019 novel coronavirus disease (COVID-19)  Z86.16     4. History of pulmonary embolism  Z86.711     5. OSA (obstructive sleep apnea)  G47.33     6. Hospital discharge follow-up  Z09          Plan:     Patient Instructions     ICD-10-CM   1. IPF (idiopathic pulmonary fibrosis) (HCC)  J84.112     2. Abnormal LFTs  R79.89     3. History of 2019 novel coronavirus disease (COVID-19)  Z86.16     4. History of pulmonary embolism  Z86.711     5. OSA (obstructive sleep apnea)  G47.33      IPF (idiopathic pulmonary fibrosis) (HCC) Abnormal LFTs due to fatty liver  -Liver function test has normalized in August [name] -Exercise hypoxemia likely due to IPF  Plan - Start pirfenidone per protocol; CMA to start the paperwork -Continue 2 L nasal cannula with exertion and monitor pulse ox and heart rate  History of 2019 novel coronavirus disease (COVID-19) History of pulmonary embolism August 13, [name]  -Risk factors include previous DVT, obesity, COVID July [name] -Glad  you are better from the recent hospitalization  Plan - Continue Pradaxa; CMA to make a refill  OSA (obstructive sleep apnea)  -Because of the COVID and the pulmonary embolism issues your sleep apnea test was canceled  Plan - Reorder CPAP titration study -Keep your visit in September [name] with sleep doctor Dr. 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 [name] 30-minute visit  -Symptom score and sit/stand test at that time.  -Monitor liver function test and Pirfenidone (Esbriet) uptake at that  time   FOLLOWUP Return in about 6 weeks (around 10/3/[name]) for 30 min visit, ILD, with any of the APPS, Face to Face OR Video Visit.    SIGNATURE    Dr. Kalman Shan, M.D., F.C.C.P,  Pulmonary and Critical Care Medicine Staff Physician, [facility] Center Director - Interstitial Lung Disease  Program  Pulmonary Fibrosis [facility] at [facility] [place], [place], [address]  Pager: [phone], If no answer or between  15:00h - 7:00h: call [phone] Telephone: [phone]  2:46 PM 8/22/[name]   Moderate Complexity MDM OFFICE  2021 E/M guidelines, first released in 2021, with minor revisions added in 2023 and [name] Must meet the requirements for 2 out of 3 dimensions to qualify.    Number and complexity of problems addressed Amount and/or complexity of data reviewed Risk of complications and/or morbidity  One or more chronic illness with mild exacerbation, OR progression, OR  side effects of treatment  Two or more stable chronic illnesses  One undiagnosed new problem with uncertain prognosis  One acute illness with systemic symptoms   One Acute complicated injury Must meet the requirements for 1 of 3 of the categories)  Category 1: Tests and documents, historian  Any combination of 3 of the following:  Assessment requiring an independent historian  Review of prior external note(s) from each unique source  Review of results of each unique test  Ordering of each unique test    Category 2: Interpretation of tests   Independent interpretation of a test performed by another physician/other qualified health care professional (not separately reported)  Category 3: Discuss management/tests  Discussion of management or test interpretation with external physician/other qualified health care professional/appropriate source (not separately reported) Moderate risk of morbidity from additional diagnostic testing or  treatment Examples only:  Prescription drug management  Decision regarding minor surgery with identfied patient or procedure risk factors  Decision regarding elective major surgery without identified patient or procedure risk factors  Diagnosis or treatment significantly limited by social determinants of health             HIGh Complexity  OFFICE   2021 E/M guidelines, first released in 2021, with minor revisions added in 2023. Must meet the requirements for 2 out of 3 dimensions to qualify.    Number and complexity of problems addressed Amount and/or complexity of data reviewed Risk of complications and/or morbidity  Severe exacerbation of chronic illness  Acute or chronic illnesses that may pose a threat to life or bodily function, e.g., multiple trauma, acute MI, pulmonary embolus, severe respiratory distress, progressive rheumatoid arthritis, psychiatric illness with potential threat to self or others, peritonitis, acute renal failure, abrupt change in neurological status Must meet the requirements for 2 of 3 of the categories)  Category 1: Tests and documents, historian  Any combination  of 3 of the following:  Assessment requiring an independent historian  Review of prior external note(s) from each unique source  Review of results of each unique test  Ordering of each unique test    Category 2: Interpretation of tests    Independent interpretation of a test performed by another physician/other qualified health care professional (not separately reported)  Category 3: Discuss management/tests  Discussion of management or test interpretation with external physician/other qualified health care professional/appropriate source (not separately reported)  HIGH risk of morbidity from additional diagnostic testing or treatment Examples only:  Drug therapy requiring intensive monitoring for toxicity  Decision for elective major surgery with identified pateint or  procedure risk factors  Decision regarding hospitalization or escalation of level of care  Decision for DNR or to de-escalate care   Parenteral controlled  substances            LEGEND - Independent interpretation involves the interpretation of a test for which there is a CPT code, and an interpretation or report is customary. When a review and interpretation of a test is performed and documented by the provider, but not separately reported (billed), then this would represent an independent interpretation. This report does not need to conform to the usual standards of a complete report of the test. This does not include interpretation of tests that do not have formal reports such as a complete blood count with differential and blood cultures. Examples would include reviewing a chest radiograph and documenting in the medical record an interpretation, but not separately reporting (billing) the interpretation of the chest radiograph.   An appropriate source includes professionals who are not health care professionals but may be involved in the management of the patient, such as a Clinical research associate, upper officer, case manager or teacher, and does not include discussion with family or informal caregivers.    - SDOH: SDOH are the conditions in the environments where people are born, live, learn, work, play, worship, and age that affect a wide range of health, functioning, and quality-of-life outcomes and risks. (e.g., housing, food insecurity, transportation, etc.). SDOH-related Z codes ranging from Z55-Z65 are the ICD-10-CM diagnosis codes used to document SDOH data Z55 - Problems related to education and literacy Z56 - Problems related to employment and unemployment Z57 - Occupational exposure to risk factors Z58 - Problems related to physical environment Z59 - Problems related to housing and economic circumstances [phone] - Problems related to social environment [phone] - Problems related to  upbringing [phone] - Other problems related to primary support group, including family circumstances Z23 - Problems related to certain psychosocial circumstances Z65 - Problems related to other psychosocial circumstances

## 2023-04-11 NOTE — Progress Notes (Signed)
  Care Coordination   Note   04/11/2023 Name: Genoveva Pu MRN: [id number] DOB: May 24, 1951  Benina Lillard is a 72 y.o. year old female who sees [name], NP for primary care. I reached out to [name] by phone today to offer care coordination services.  Ms. Menth was given information about Care Coordination services today including:   The Care Coordination services include support from the care team which includes your Nurse Coordinator, Clinical Social Worker, or Pharmacist.  The Care Coordination team is here to help remove barriers to the health concerns and goals most important to you. Care Coordination services are voluntary, and the patient may decline or stop services at any time by request to their care team member.   Care Coordination Consent Status: Patient agreed to services and verbal consent obtained.   Follow up plan:  Telephone appointment with care coordination team member scheduled for:  04/16/23  Encounter Outcome:  Pt. Scheduled  [facility] Coordination Care Guide  Direct Dial: [phone]

## 2023-04-11 NOTE — Patient Instructions (Addendum)
ICD-10-CM   1. IPF (idiopathic pulmonary fibrosis) (HCC)  J84.112     2. Abnormal LFTs  R79.89     3. History of 2019 novel coronavirus disease (COVID-19)  Z86.16     4. History of pulmonary embolism  Z86.711     5. OSA (obstructive sleep apnea)  G47.33      IPF (idiopathic pulmonary fibrosis) (HCC) Abnormal LFTs due to fatty liver  -Liver function test has normalized in August 2024 -Exercise hypoxemia likely due to IPF  Plan - Start pirfenidone per protocol; CMA to start the paperwork -Continue 2 L nasal cannula with exertion and monitor pulse ox and heart rate  History of 2019 novel coronavirus disease (COVID-19) History of pulmonary embolism April 02, 2023  -Risk factors include previous DVT, obesity, COVID July 2024 -Glad you are better from the recent hospitalization  Plan - Continue Pradaxa; CMA to make a refill  OSA (obstructive sleep apnea)  -Because of the COVID and the pulmonary embolism issues your sleep apnea test was canceled  Plan - Reorder CPAP titration study -Keep your visit in September 2024 with sleep doctor Dr. 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 [name] 30-minute visit  -Symptom score and sit/stand test at that time.  -Monitor liver function test and Pirfenidone (Esbriet) uptake at that time

## 2023-04-12 ENCOUNTER — Ambulatory Visit: Payer: Medicare Other | Admitting: Family

## 2023-04-12 NOTE — Telephone Encounter (Signed)
Do what you need to do but keep your pulse ox greater than or equal to 88% with or without oxygen at all times

## 2023-04-15 ENCOUNTER — Encounter: Payer: Self-pay | Admitting: Interventional Cardiology

## 2023-04-15 ENCOUNTER — Ambulatory Visit: Payer: Medicare Other | Attending: Interventional Cardiology | Admitting: Interventional Cardiology

## 2023-04-15 ENCOUNTER — Telehealth: Payer: Self-pay | Admitting: Internal Medicine

## 2023-04-15 VITALS — BP 114/62 | HR 80 | Ht 66.5 in | Wt 218.0 lb

## 2023-04-15 DIAGNOSIS — E785 Hyperlipidemia, unspecified: Secondary | ICD-10-CM | POA: Insufficient documentation

## 2023-04-15 DIAGNOSIS — T466X5A Adverse effect of antihyperlipidemic and antiarteriosclerotic drugs, initial encounter: Secondary | ICD-10-CM | POA: Insufficient documentation

## 2023-04-15 DIAGNOSIS — M609 Myositis, unspecified: Secondary | ICD-10-CM | POA: Diagnosis present

## 2023-04-15 DIAGNOSIS — I428 Other cardiomyopathies: Secondary | ICD-10-CM | POA: Insufficient documentation

## 2023-04-15 DIAGNOSIS — I10 Essential (primary) hypertension: Secondary | ICD-10-CM | POA: Insufficient documentation

## 2023-04-15 DIAGNOSIS — I700 Atherosclerosis of aorta: Secondary | ICD-10-CM | POA: Diagnosis present

## 2023-04-15 DIAGNOSIS — Z95810 Presence of automatic (implantable) cardiac defibrillator: Secondary | ICD-10-CM | POA: Insufficient documentation

## 2023-04-15 NOTE — Progress Notes (Signed)
Cardiology Office Note   Date:  04/15/2023   ID:  Adriana, [name] [birth year], MRN [id number]  PCP:  Adriana Fendt, NP    No chief complaint on file.  NICM  Wt Readings from Last 3 Encounters:  04/15/23 218 lb (98.9 kg)  04/11/23 217 lb 12.8 oz (98.8 kg)  04/02/23 216 lb 0.8 oz (98 kg)       History of Present Illness: [name] is a 72 y.o. female    who had a nonischemic cardiomyopathy.  In 2013, ejection fraction was listed at 35 to 40%.  Most recent echocardiogram in 2018 that showed that her LVEF has normalized.  She did have a defibrillator placed for ventricular tachycardia.   No CAD by cath in approximately 2006.  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    She had a CXR and there was a question of enlarged heart.     She was having some palpitations, and Zio patch was ok in 11/2019: "Symptoms of flutters assoc mostly with PVCs  Symptoms of flutters also assoc with sinus S VT Nonsustained 6 episodes; fastest 141 bpm for 6 beats; longest for 7 beats at 100 bpm "   She received her COVID vaccines.    Walking has been limited by knee pain.  She has used Lasix prn.     EF 2018 showed normal LVEF.    She had right total knee arthroplasty on 05/22/21. On 06/03/21, she was admitted to [facility] for evaluation of shortness of breath. CT was negative for PE. She was treated for possible pneumonia/chronic interstitial lung disease/respiratory bronchiolitis. She took prednisone. Is using home O2 when she is very active.    She presented in 05/2021 for evaluation of elevated HR with activity. She called 1 month ago due to concerns with HR going up to 134 when she stood up from her seat to go to the bathroom. She has continued to notice HR in high 90's bpm with minimal activity.    Echo in 07/2021 showed: "Left ventricular ejection fraction, by estimation, is 50 to 55%. The  left ventricle has low normal function. The left ventricle has no  regional  wall motion abnormalities. There is mild concentric left ventricular  hypertrophy. Left ventricular  diastolic parameters are indeterminate.   2. Right ventricular systolic function is normal. The right ventricular  size is normal. There is mildly elevated pulmonary artery systolic  pressure. The estimated right ventricular systolic pressure is 39.5 mmHg.   3. Left atrial size was mildly dilated.   4. Right atrial size was mildly dilated.   5. The mitral valve is grossly normal. Trivial mitral valve  regurgitation. No evidence of mitral stenosis.   6. The aortic valve is tricuspid. Aortic valve regurgitation is not  visualized. No aortic stenosis is present.   7. The inferior vena cava is normal in size with greater than 50%  respiratory variability, suggesting right atrial pressure of 3 mmHg.   8. Intra atrial shunt cannot be excluded. "   Noted in 2023: "She has felt some mild chest pain in the middle of her chest.  Occurred since COVID/pneumonia in 10/22.  Finished therapy for her knee.  CP not related to exertion.  Happens randomly and resolves spontaneously. "  Had chest pain and shortness of breath in August 2024.  This led to PE protocol CT which revealed bilateral pulmonary emboli.  She has been started on Pradaxa after hospital  stay.  Ejection fraction dropped mildly.  She is already noticing some improvement with her breathing a few weeks after anticoagulation started.  She is using oxygen when she is active.  At rest, she notices the biggest difference/improvement in her breathing.   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   Bronchiectasis (HCC)    Cerebral aneurysm, nonruptured    had a clip put in   CHF (congestive heart failure) (HCC)    Clostridium difficile infection    Depressive  disorder, not elsewhere classified    Diverticulosis of colon (without mention of hemorrhage)    Esophageal reflux    Fatty liver    Fibromyalgia    Gastritis    GERD (gastroesophageal reflux disease)    GI bleed 2004   Glaucoma    Hiatal hernia    History of COVID-19 05/04/2021   Hyperlipidemia    Hypertension    Hypothyroidism    Internal hemorrhoids    Joint pain    Multifocal pneumonia 06/03/2021   Obstructive sleep apnea (adult) (pediatric)    Osteoarthritis    Ostium secundum type atrial septal defect    Other chronic nonalcoholic liver disease    Other pulmonary embolism and infarction    Palpitations    Paroxysmal ventricular tachycardia (HCC)    Personal history of radiation therapy    PONV (postoperative nausea and vomiting)    Presence of permanent cardiac pacemaker    PUD (peptic ulcer disease)    Radiation 02/03/15-03/10/15   Right Breast   Sarcoid    per pt , not sure   Schatzki's ring    Shortness of breath    Sleep apnea    Stroke [facility]) 2013   tia/ pt feels it was around 2008 0r 2009   Takotsubo syndrome    Tubular adenoma of colon    Unspecified transient cerebral ischemia    Unspecified vitamin D deficiency     Past Surgical History:  Procedure Laterality Date   ABDOMINAL HYSTERECTOMY  11/22/1998   ABI  2006   normal   BREAST EXCISIONAL BIOPSY     BREAST LUMPECTOMY Right 2016   BREAST LUMPECTOMY WITH RADIOACTIVE SEED LOCALIZATION Right 01/03/2015   Procedure: BREAST LUMPECTOMY WITH RADIOACTIVE SEED LOCALIZATION;  Surgeon: Chevis Pretty III, MD;  Location: [facility] OR;  Service: General;  Laterality: Right;   CARDIAC DEFIBRILLATOR PLACEMENT  2006; 2012   BSX single chamber ICD   carotid dopplers  2006   neg   CEREBRAL ANEURYSM REPAIR  02/1999   COLONOSCOPY     EYE SURGERY     HAMMER TOE SURGERY  11/15/2020   3rd digit bilateral feet    hospitalization  2004   GI bleed, PUD, diverticulosis (EGD,colonscopy)   hospitalization     PE, NSVT, s/p defib    JOINT REPLACEMENT  05/2021   KNEE ARTHROSCOPY     bilateral   LEFT HEART CATHETERIZATION WITH CORONARY ANGIOGRAM N/A 02/22/2012   Procedure: LEFT HEART CATHETERIZATION WITH CORONARY ANGIOGRAM;  Surgeon: Kathleene Hazel, MD;  Location: [facility] CATH LAB;  Service: Cardiovascular;  Laterality: N/A;   PARTIAL HYSTERECTOMY     Fibroids   TONGUE BIOPSY  12/12/2017   due to sore tongue and white patches/abnormal cells   TOTAL KNEE ARTHROPLASTY Left  02/13/2016   Procedure: TOTAL KNEE ARTHROPLASTY;  Surgeon: Dannielle Huh, MD;  Location: [facility] OR;  Service: Orthopedics;  Laterality: Left;   TOTAL KNEE ARTHROPLASTY Right 05/22/2021   Procedure: TOTAL KNEE ARTHROPLASTY;  Surgeon: Dannielle Huh, MD;  Location: [facility] ORS;  Service: Orthopedics;  Laterality: Right;  with block   TUBAL LIGATION  09/20/1978   UPPER GASTROINTESTINAL ENDOSCOPY       Current Outpatient Medications  Medication Sig Dispense Refill   albuterol (VENTOLIN HFA) 108 (90 Base) MCG/ACT inhaler Inhale 2 puffs into the lungs every 6 (six) hours as needed for wheezing or shortness of breath. 18 g 3   azelastine (ASTELIN) 0.1 % nasal spray Place 1 spray into both nostrils 2 (two) times daily. Use in each nostril as directed 90 mL 3   benzonatate (TESSALON) 200 MG capsule Take 1 capsule (200 mg total) by mouth 3 (three) times daily as needed for cough. 90 capsule 2   budesonide-formoterol (SYMBICORT) 80-4.5 MCG/ACT inhaler Inhale 1 puff into the lungs 2 (two) times daily. 1 each 12   Cholecalciferol (DIALYVITE VITAMIN D 5000) 125 MCG (5000 UT) capsule Take 5,000 Units by mouth daily.     cloBAZam (ONFI) 10 MG tablet Take 1 tablet (10 mg total) by mouth 2 (two) times daily. 180 tablet 1   dabigatran (PRADAXA) 150 MG CAPS capsule Take 1 capsule (150 mg total) by mouth 2 (two) times daily. 60 capsule 2   diltiazem (CARDIZEM CD) 120 MG 24 hr capsule Take 1 capsule (120 mg total) by mouth daily. 90 capsule 3   empagliflozin (JARDIANCE) 10 MG TABS  tablet Take 1 tablet (10 mg total) by mouth daily. 90 tablet 3   Evolocumab (REPATHA SURECLICK) 140 MG/ML SOAJ Inject 140 mg into the skin every 14 (fourteen) days. 6 mL 3   famotidine (PEPCID) 40 MG tablet Take 1 tablet (40 mg total) by mouth at bedtime. NEEDS APPT FOR FURTHER REFILLS 90 tablet 6   fexofenadine (ALLEGRA) 180 MG tablet Take 180 mg by mouth daily as needed for allergies (alternates with zyrtec sometimes).     fluticasone (FLONASE) 50 MCG/ACT nasal spray Place 2 sprays into both nostrils daily as needed for allergies or rhinitis. 48 g 3   FOLIC ACID PO Take 1 tablet by mouth daily.     furosemide (LASIX) 20 MG tablet TAKE 2 TABLETS (40 MG) 3 DAYS PER WEEK AND TAKE 1 TABLET (20 MG) THE OTHER DAYS OF THE WEEK (Patient taking differently: Take 20 mg by mouth daily as needed. PER PT TAKES PRN) 130 tablet 1   hydroquinone 4 % cream Apply 1 Application topically at bedtime.     hydroxypropyl methylcellulose / hypromellose (ISOPTO TEARS / GONIOVISC) 2.5 % ophthalmic solution Place 1 drop into both eyes at bedtime.     latanoprost (XALATAN) 0.005 % ophthalmic solution Place 1 drop into both eyes at bedtime.     levocetirizine (XYZAL) 5 MG tablet Take 5 mg by mouth daily at 12 noon.     levothyroxine (SYNTHROID) 50 MCG tablet TAKE 1 TABLET EVERY DAY 90 tablet 3   magnesium oxide (MAG-OX) 400 (240 Mg) MG tablet TAKE 1 TABLET TWICE DAILY 180 tablet 3   montelukast (SINGULAIR) 10 MG tablet TAKE 1 TABLET AT BEDTIME 90 tablet 3   Multiple Vitamins-Minerals (MULTIVITAMIN PO) Take 1 tablet by mouth daily.     pantoprazole (PROTONIX) 40 MG tablet TAKE 1 TABLET EVERY DAY 90 tablet 3   polyethylene glycol powder (GLYCOLAX/MIRALAX) powder  Take 17 grams by mouth daily 1080 g 0   potassium chloride (KLOR-CON M) 10 MEQ tablet TAKE 1/2 TABLET EVERY DAY (CALL OFFICE TO SCHEDULE YEARLY APPT) 45 tablet 3   promethazine-dextromethorphan (PROMETHAZINE-DM) 6.25-15 MG/5ML syrup Take 5 mLs by mouth 4 (four) times  daily as needed for cough. 118 mL 0   Respiratory Therapy Supplies (FLUTTER) DEVI 1 Device by Does not apply route as needed. 1 each 0   sodium chloride HYPERTONIC 3 % nebulizer solution Take by nebulization 2 (two) times daily as needed for cough. 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 750 mL 12   tiZANidine (ZANAFLEX) 4 MG tablet Take 0.5-1 tablets (2-4 mg total) by mouth 2 (two) times daily. 180 tablet 1   traZODone (DESYREL) 100 MG tablet TAKE 1 TO 1 AND 1/2 TABLETS AT BEDTIME 135 tablet 3   Current Facility-Administered Medications  Medication Dose Route Frequency Provider Last Rate Last Admin   lidocaine (XYLOCAINE) 1 % (with pres) injection 1 mL  1 mL Other Once Lovorn, Megan, MD       lidocaine (XYLOCAINE) 1 % (with pres) injection 3 mL  3 mL Other Once Lovorn, Megan, MD        Allergies:   Ace inhibitors, Amitriptyline hcl, Atorvastatin, Dilantin [phenytoin sodium extended], Duloxetine hcl, Paroxetine, Ramipril, Rosuvastatin, Carbamazepine, Codeine, Phenytoin sodium extended, Simvastatin, and Wellbutrin [bupropion]    Social History:  The patient  reports that she quit smoking about 33 years ago. Her smoking use included cigarettes. She started smoking about 53 years ago. She has a 20 pack-year smoking history. She has been exposed to tobacco smoke. She has never used smokeless tobacco. She reports current alcohol use of about 1.0 standard drink of alcohol per week. She reports that she does not currently use drugs. Frequency: 1.00 time per week.   Family History:  The patient's family history includes Allergies in her sister; Alzheimer's disease in her mother; Anxiety disorder in her sister; Arthritis in her brother, mother, and sister; Breast cancer in her cousin and maternal aunt; Breast cancer (age of onset: 61) in her maternal aunt; Cancer (age of onset: 55) in her maternal grandmother; Colon cancer (age of onset: 88) in her maternal aunt; Depression in her sister; Diabetes in her mother; Early death  in her sister; Hearing loss in her brother; Heart disease in her brother, father, sister, and son; Hypertension in her brother, mother, and sister; Lung cancer in her maternal uncle; Lung cancer (age of onset: 62) in her maternal grandfather; Obesity in her mother; Other in her brother; Pancreatic cancer (age of onset: 45) in her cousin; Prostate cancer (age of onset: 12) in her brother; Seizures in her sister; Throat cancer in her brother; Vision loss in her brother.    ROS:  Please see the history of present illness.   Otherwise, review of systems are positive for mild intermittent abdominal pain-eating ok, moving bowels.   All other systems are reviewed and negative.    PHYSICAL EXAM: VS:  BP 114/62   Pulse 80   Ht 5' 6.5" (1.689 m)   Wt 218 lb (98.9 kg)   LMP  (LMP Unknown)   SpO2 97%   BMI 34.66 kg/m  , BMI Body mass index is 34.66 kg/m. GEN: Well nourished, well developed, in no acute distress HEENT: normal Neck: no JVD, carotid bruits, or masses Cardiac: RRR; no murmurs, rubs, or gallops,no edema  Respiratory:  clear to auscultation bilaterally, normal work of breathing GI: soft, nontender, nondistended, + BS  MS: no deformity or atrophy Skin: warm and dry, no rash Neuro:  Strength and sensation are intact Psych: euthymic mood, full affect   EKG:   The ekg ordered today demonstrates NSR, no ST changes   Recent Labs: 09/12/2022: TSH 3.100 03/29/2023: Pro B Natriuretic peptide (BNP) 84.0 04/02/2023: ALT 35; B Natriuretic Peptide 63.8 04/03/2023: BUN 14; Creatinine, Ser 0.71; Hemoglobin 12.7; Platelets 199; Potassium 3.8; Sodium 140   Lipid Panel    Component Value Date/Time   CHOL 229 (H) 11/16/2022 1058   TRIG 92 11/16/2022 1058   HDL 81 11/16/2022 1058   CHOLHDL 2.8 11/16/2022 1058   CHOLHDL 3 12/20/2020 1045   VLDL 18.4 12/20/2020 1045   LDLCALC 132 (H) 11/16/2022 1058   LDLDIRECT 141.8 07/04/2007 0936     Other studies Reviewed: Additional studies/ records that  were reviewed today with results demonstrating: Chest Ct reviewed from 03/2023.  LDL 132 in 2/24.   ASSESSMENT AND PLAN:  Prior NICM: EF improved. Appears euvolemic.  EF 40-45% in the setting of bilateral PE in 03/2023.  BP controlled.  Would not add meds at this time. Will hopefully improve as PEs resolve.  HTN: The current medical regimen is effective;  continue present plan and medications. Hyperlipidemia/aortic atherosclerosis: Stopped simvastatin due to myositis.  Switched to [employer]. Seen in [facility] lipid clinic Obesity: healthy diet. ICD: per EP. No VT as of 10/2022.   IPF: followed by Dr. [name].  Using supplemental O2 when active. Now with bilateral PE in 03/2023.  On Pradaxa- more affordable.  No bleeding problems. No falls.   Current medicines are reviewed at length with the patient today.  The patient concerns regarding her medicines were addressed.  The following changes have been made:  No change  Labs/ tests ordered today include:  No orders of the defined types were placed in this encounter.   Recommend 150 minutes/week of aerobic exercise Low fat, low carb, high fiber diet recommended  Disposition:   FU in 6 months at [facility] per her preference   Signed, Lance Muss, MD  04/15/2023 10:55 AM    [facility] [address] [place], [place], [place]  [address] Phone: [phone]; Fax: [phone]

## 2023-04-15 NOTE — Patient Instructions (Signed)
Medication Instructions:  Your physician recommends that you continue on your current medications as directed. Please refer to the Current Medication list given to you today.  *If you need a refill on your cardiac medications before your next appointment, please call your pharmacy*  Follow-Up: At [facility], you and your health needs are our priority.  As part of our continuing mission to provide you with exceptional heart care, we have created designated Provider Care Teams.  These Care Teams include your primary Cardiologist (physician) and Advanced Practice Providers (APPs -  Physician Assistants and Nurse Practitioners) who all work together to provide you with the care you need, when you need it.  Your next appointment:   6 months  Provider:   Jodelle Red, MD or Chilton Si, MD

## 2023-04-15 NOTE — Telephone Encounter (Signed)
Pt came in to drop off paperwork. I dropped it in Dr. [name] Mailbox

## 2023-04-16 ENCOUNTER — Ambulatory Visit: Payer: Self-pay | Admitting: Licensed Clinical Social Worker

## 2023-04-16 NOTE — Patient Instructions (Signed)
Visit Information  Thank you for taking time to visit with me today. Please don't hesitate to contact me if I can be of assistance to you.   Following are the goals we discussed today:   Goals Addressed             This Visit's Progress    Patient stated she has stress in managing medical needs.       Interventions:    Spoke with client about client needs Discussed program support with RN, LCSW, Pharmacist.  Discussed client medical needs. Discussed her managing medical needs Discussed medication procurement.  She said some of her medications are expensive Provided counseling support. Discussed oxygen use of client.  She lives alone, she is dependent on oxygen use. She uses [employer] to help with   oxygen supply needs Discussed support with NP Discussed ambulation of client. She has CHF. She uses oxygen when she walks Discussed vision of client. She wears glasses.  She had a cataract removed in June of 2024.  Discussed client recovery from fall (accident) in the past She received physical therapy support after this accident Discussed family support. She has support from her son Discussed the health needs of her son.  Discussed energy level of client. She said she has low energy Encouraged client to call LCSW as needed for SW support at [phone].            Our next appointment is by telephone on 05/07/23 at 2:00 PM   Please call the care guide team at [phone] if you need to cancel or reschedule your appointment.   If you are experiencing a Mental Health or Behavioral Health Crisis or need someone to talk to, please go to [facility] [address], [place] [id number])   The patient verbalized understanding of instructions, educational materials, and care plan provided today and DECLINED offer to receive copy of patient instructions, educational materials, and care plan.   The patient has been provided with contact  information for the care management team and has been advised to call with any health related questions or concerns.   Kelton Pillar.Decklyn Hyder MSW, LCSW Licensed Visual merchandiser [facility] Care Management [phone]

## 2023-04-16 NOTE — Patient Outreach (Signed)
  Care Coordination   Initial Visit Note   04/16/2023 Name: [name] MRN: [id number] DOB: 1951-04-10  [name] is a 72 y.o. year old female who sees Adriana Fendt, NP for primary care. I spoke with  [name] by phone today.  What matters to the patients health and wellness today? Client has stress in managing medical needs     Goals Addressed             This Visit's Progress    Patient stated she has stress in managing medical needs.       Interventions:    Spoke with client about client needs Discussed program support with RN, LCSW, Pharmacist.  Discussed client medical needs. Discussed her managing medical needs Discussed medication procurement.  She said some of her medications are expensive Provided counseling support for client Used Active Listening skills to hear the needs of client. Discussed oxygen use of client.  She lives alone, she is dependent on oxygen use. She uses [employer] to help with oxygen supply needs Discussed support with NP Discussed ambulation of client. She has CHF. She uses oxygen when she walks Discussed vision of client. She wears glasses.  She had a cataract removed in June of 2024.  Discussed client recovery from fall (accident) in the past She received physical therapy support after this accident Discussed family support. She has support from her son Discussed the health needs of her son.  Discussed energy level of client. She said she has low energy She said that it is difficult to do some daily tasks due to her decreased energy level Thanked client for phone call with LCSW Encouraged client to call LCSW as needed for SW support at [phone].            SDOH assessments and interventions completed:  Yes  SDOH Interventions Today    Flowsheet Row Most Recent Value  SDOH Interventions   Depression Interventions/Treatment  Counseling  Physical Activity Interventions Other (Comments)  [client has  mobility challenges]  Stress Interventions Provide Counseling  [has stress in managing medical needs]        Care Coordination Interventions:  Yes, provided    Interventions Today    Flowsheet Row Most Recent Value  Chronic Disease   Chronic disease during today's visit Other  [spoke with client about client needs]  General Interventions   General Interventions Discussed/Reviewed General Interventions Discussed, Community Resources  [discussed program support]  Exercise Interventions   Exercise Discussed/Reviewed Physical Activity  [has some mobility issues. Has decreased energy]  Physical Activity Discussed/Reviewed Physical Activity Reviewed  Education Interventions   Education Provided Provided Education  Provided Verbal Education On Community Resources  Mental Health Interventions   Mental Health Discussed/Reviewed Coping Strategies  [client has stress in managing medical needs. she is trying to use coping skills to manage stress issues]  Pharmacy Interventions   Pharmacy Dicussed/Reviewed Pharmacy Topics Discussed       Follow up plan: Follow up call scheduled for 05/07/23 at 2:00 PM     Encounter Outcome:  Pt. Visit Completed   [name].[name] MSW, LCSW Licensed Visual merchandiser [facility] Care Management [phone]

## 2023-04-17 ENCOUNTER — Other Ambulatory Visit (HOSPITAL_COMMUNITY): Payer: Self-pay

## 2023-04-17 ENCOUNTER — Other Ambulatory Visit: Payer: Self-pay | Admitting: Internal Medicine

## 2023-04-17 NOTE — Telephone Encounter (Signed)
PAP application received by pharmacy team. Per investigation pt is currently enrolled in a grant through the [employer] as of 12/27/22 (see information below), however since she has yet to use her benefits a termination letter has been sent out advising that patient has until 05/18/23 to submit a claim to PAF or her awarded grant will be forfeit.   Test claim reveals that patients previous PA approval is still active, and she currently has a copay of $0.00 (rather than the $162.52 that it was on 11/30/22). Current PA is good until 08/20/23.

## 2023-04-25 NOTE — Telephone Encounter (Signed)
See routing comment(s) for message information.

## 2023-04-26 ENCOUNTER — Other Ambulatory Visit: Payer: Self-pay

## 2023-04-26 ENCOUNTER — Telehealth: Payer: Self-pay | Admitting: Pharmacist

## 2023-04-26 ENCOUNTER — Other Ambulatory Visit (HOSPITAL_COMMUNITY): Payer: Self-pay

## 2023-04-26 DIAGNOSIS — Z5181 Encounter for therapeutic drug level monitoring: Secondary | ICD-10-CM

## 2023-04-26 DIAGNOSIS — J84112 Idiopathic pulmonary fibrosis: Secondary | ICD-10-CM

## 2023-04-26 MED ORDER — PIRFENIDONE 267 MG PO TABS
ORAL_TABLET | ORAL | 0 refills | Status: AC
Start: 2023-04-26 — End: ?
  Filled 2023-04-26: qty 207, fill #0
  Filled 2023-05-06: qty 207, 30d supply, fill #0

## 2023-04-26 MED ORDER — PIRFENIDONE 267 MG PO TABS
801.0000 mg | ORAL_TABLET | Freq: Three times a day (TID) | ORAL | 4 refills | Status: DC
  Filled 2023-04-26 – 2023-06-25 (×2): qty 270, 30d supply, fill #0
  Filled 2023-07-25: qty 270, 30d supply, fill #1
  Filled 2023-08-20: qty 270, 30d supply, fill #2
  Filled 2023-09-18: qty 270, 30d supply, fill #3
  Filled 2023-10-16: qty 270, 30d supply, fill #4

## 2023-04-26 NOTE — Telephone Encounter (Signed)
Subjective:  Patient called today by [facility] pharmacy team for Esbriet new start.   Patient was last seen by Dr. [name] on 04/11/23.  Naive to antifibrotics  History of elevated LFTs: Yes History of diarrhea, nausea, vomiting: No  Objective: Allergies  Allergen Reactions   Ace Inhibitors Swelling    Angioedema; makes tongue "break out"    Amitriptyline Hcl Other (See Comments)     makes her too sleepy!   Atorvastatin Other (See Comments)    SEVERE MYALGIA   Dilantin [Phenytoin Sodium Extended] Rash    Severe rash   Duloxetine Hcl Nausea Only and Other (See Comments)    Sleepiness/ sick  Other Reaction(s): GI Intolerance   Paroxetine Nausea Only    Rapid heartbeat   Ramipril Other (See Comments)    TONGUE ULCERS    Rosuvastatin Other (See Comments)    SEVERE MYALGIA   Carbamazepine Rash   Codeine Itching   Phenytoin Sodium Extended Rash   Simvastatin Other (See Comments)    Increase in CK, myalgias   Wellbutrin [Bupropion] Palpitations    Outpatient Encounter Medications as of 04/26/2023  Medication Sig Note   albuterol (VENTOLIN HFA) 108 (90 Base) MCG/ACT inhaler Inhale 2 puffs into the lungs every 6 (six) hours as needed for wheezing or shortness of breath.    azelastine (ASTELIN) 0.1 % nasal spray Place 1 spray into both nostrils 2 (two) times daily. Use in each nostril as directed    benzonatate (TESSALON) 200 MG capsule Take 1 capsule (200 mg total) by mouth 3 (three) times daily as needed for cough.    budesonide-formoterol (SYMBICORT) 80-4.5 MCG/ACT inhaler Inhale 1 puff into the lungs 2 (two) times daily.    Cholecalciferol (DIALYVITE VITAMIN D 5000) 125 MCG (5000 UT) capsule Take 5,000 Units by mouth daily.    cloBAZam (ONFI) 10 MG tablet Take 1 tablet (10 mg total) by mouth 2 (two) times daily.    dabigatran (PRADAXA) 150 MG CAPS capsule Take 1 capsule (150 mg total) by mouth 2 (two) times daily.    diltiazem (CARDIZEM CD) 120 MG 24 hr capsule Take  1 capsule (120 mg total) by mouth daily.    empagliflozin (JARDIANCE) 10 MG TABS tablet Take 1 tablet (10 mg total) by mouth daily.    Evolocumab (REPATHA SURECLICK) 140 MG/ML SOAJ Inject 140 mg into the skin every 14 (fourteen) days.    famotidine (PEPCID) 40 MG tablet Take 1 tablet (40 mg total) by mouth at bedtime. NEEDS APPT FOR FURTHER REFILLS    fexofenadine (ALLEGRA) 180 MG tablet Take 180 mg by mouth daily as needed for allergies (alternates with zyrtec sometimes).    fluticasone (FLONASE) 50 MCG/ACT nasal spray Place 2 sprays into both nostrils daily as needed for allergies or rhinitis.    FOLIC ACID PO Take 1 tablet by mouth daily.    furosemide (LASIX) 20 MG tablet TAKE 2 TABLETS (40 MG) 3 DAYS PER WEEK AND TAKE 1 TABLET (20 MG) THE OTHER DAYS OF THE WEEK (Patient taking differently: Take 20 mg by mouth daily as needed. PER PT TAKES PRN)    hydroquinone 4 % cream Apply 1 Application topically at bedtime.    hydroxypropyl methylcellulose / hypromellose (ISOPTO TEARS / GONIOVISC) 2.5 % ophthalmic solution Place 1 drop into both eyes at bedtime.    latanoprost (XALATAN) 0.005 % ophthalmic solution Place 1 drop into both eyes at bedtime.    levocetirizine (XYZAL) 5 MG tablet Take 5 mg by mouth  daily at 12 noon.    levothyroxine (SYNTHROID) 50 MCG tablet TAKE 1 TABLET EVERY DAY    magnesium oxide (MAG-OX) 400 (240 Mg) MG tablet TAKE 1 TABLET TWICE DAILY    montelukast (SINGULAIR) 10 MG tablet TAKE 1 TABLET AT BEDTIME    Multiple Vitamins-Minerals (MULTIVITAMIN PO) Take 1 tablet by mouth daily.    pantoprazole (PROTONIX) 40 MG tablet TAKE 1 TABLET EVERY DAY    polyethylene glycol powder (GLYCOLAX/MIRALAX) powder Take 17 grams by mouth daily 01/09/2023: PER PT TAKES PRN   potassium chloride (KLOR-CON M) 10 MEQ tablet TAKE 1/2 TABLET EVERY DAY (CALL OFFICE TO SCHEDULE YEARLY APPT)    promethazine-dextromethorphan (PROMETHAZINE-DM) 6.25-15 MG/5ML syrup Take 5 mLs by mouth 4 (four) times daily as  needed for cough.    Respiratory Therapy Supplies (FLUTTER) DEVI 1 Device by Does not apply route as needed.    sodium chloride HYPERTONIC 3 % nebulizer solution Take by nebulization 2 (two) times daily as needed for cough. 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    traZODone (DESYREL) 100 MG tablet TAKE 1 TO 1 AND 1/2 TABLETS AT BEDTIME 01/09/2023: PER PT TAKES PRN   Facility-Administered Encounter Medications as of 04/26/2023  Medication   lidocaine (XYLOCAINE) 1 % (with pres) injection 1 mL   lidocaine (XYLOCAINE) 1 % (with pres) injection 3 mL     Immunization History  Administered Date(s) Administered   Fluad Quad(high Dose 65+) 04/17/2019, 04/30/2020   H1N1 07/24/2008   Influenza Split 07/17/2011, 09/27/2011, 04/29/2012   Influenza Whole 07/20/2004, 07/04/2007, 05/07/2008, 05/19/2009, 05/17/2010   Influenza, High Dose Seasonal PF 05/30/2017, 05/21/2018   Influenza,inj,Quad PF,6+ Mos 05/20/2013, 05/06/2014, 05/17/2015, 05/15/2016, 04/27/2021   Influenza-Unspecified 05/07/2018   PFIZER(Purple Top)SARS-COV-2 Vaccination 09/25/2019, 10/16/2019, 06/04/2020, 01/04/2021, 08/06/2021   Pfizer Covid-19 Vaccine Bivalent Booster 24yrs & up 05/07/2022   Pneumococcal Conjugate-13 11/22/2015, 08/06/2021   Pneumococcal Polysaccharide-23 05/20/2002, 05/07/2008, 06/25/2014   Respiratory Syncytial Virus Vaccine,Recomb Aduvanted(Arexvy) 06/26/2022   Td 09/07/2009   Zoster, Live 09/21/2011      PFT's TLC  Date Value Ref Range Status  06/08/2022 3.76 L Final      CMP     Component Value Date/Time   NA 140 04/03/2023 0337   NA 138 03/23/2022 1553   NA 141 12/01/2014 0847   K 3.8 04/03/2023 0337   K 3.8 12/01/2014 0847   CL 105 04/03/2023 0337   CO2 28 04/03/2023 0337   CO2 26 12/01/2014 0847   GLUCOSE 100 (H) 04/03/2023 0337   GLUCOSE 175 (H) 12/01/2014 0847   BUN 14 04/03/2023 0337   BUN 10 03/23/2022  1553   BUN 10.6 12/01/2014 0847   CREATININE 0.71 04/03/2023 0337   CREATININE 0.82 02/27/2017 1518   CREATININE 0.8 12/01/2014 0847   CALCIUM 9.6 04/03/2023 0337   CALCIUM 9.8 12/01/2014 0847   PROT 7.5 04/02/2023 1454   PROT 6.6 10/19/2021 0911   PROT 7.5 12/01/2014 0847   ALBUMIN 3.7 04/02/2023 1454   ALBUMIN 3.9 10/19/2021 0911   ALBUMIN 3.9 12/01/2014 0847   AST 27 04/02/2023 1454   AST 29 12/01/2014 0847   ALT 35 04/02/2023 1454   ALT 33 12/01/2014 0847   ALKPHOS 109 04/02/2023 1454   ALKPHOS 127 12/01/2014 0847   BILITOT 0.5 04/02/2023 1454   BILITOT 0.3 10/19/2021 0911   BILITOT 0.42 12/01/2014 0847   GFRNONAA >60 04/03/2023 1610  GFRNONAA 74 02/27/2017 1518   GFRAA 82 10/01/2019 1408   GFRAA 85 02/27/2017 1518      CBC    Component Value Date/Time   WBC 6.1 04/03/2023 0337   RBC 4.19 04/03/2023 0337   HGB 12.7 04/03/2023 0337   HGB 13.2 11/20/2021 1452   HGB 12.4 12/01/2014 0845   HCT 39.5 04/03/2023 0337   HCT 40.3 11/20/2021 1452   HCT 38.8 12/01/2014 0845   PLT 199 04/03/2023 0337   PLT 225 11/20/2021 1452   MCV 94.3 04/03/2023 0337   MCV 89 11/20/2021 1452   MCV 88.5 12/01/2014 0845   MCH 30.3 04/03/2023 0337   MCHC 32.2 04/03/2023 0337   RDW 13.2 04/03/2023 0337   RDW 12.7 11/20/2021 1452   RDW 12.7 12/01/2014 0845   LYMPHSABS 0.3 (L) 04/02/2023 1454   LYMPHSABS 1.1 11/20/2021 1452   LYMPHSABS 1.0 12/01/2014 0845   MONOABS 0.2 04/02/2023 1454   MONOABS 0.3 12/01/2014 0845   EOSABS 0.0 04/02/2023 1454   EOSABS 0.2 11/20/2021 1452   EOSABS 0.1 03/13/2007 1249   BASOSABS 0.0 04/02/2023 1454   BASOSABS 0.0 11/20/2021 1452   BASOSABS 0.0 12/01/2014 0845      LFT's    Latest Ref Rng & Units 04/02/2023    2:54 PM 03/29/2023    1:33 PM 02/22/2023    2:53 PM  Hepatic Function  Total Protein 6.5 - 8.1 g/dL 7.5  7.5  7.3   Albumin 3.5 - 5.0 g/dL 3.7  4.0  4.1   AST 15 - 41 U/L 27  28  39   ALT 0 - 44 U/L 35  33  54   Alk Phosphatase 38 - 126  U/L 109  125  113   Total Bilirubin 0.3 - 1.2 mg/dL 0.5  0.6  0.4   Bilirubin, Direct 0.0 - 0.3 mg/dL  0.1  0.1     HRCT (01/22/5408) - Cardiomegaly. Increased interstitial markings in right upper and both lower lung fields suggest scarring. There are no signs of alveolar pulmonary edema or new focal infiltrates.  Assessment and Plan  Esbriet Medication Management Thoroughly counseled patient on the efficacy, mechanism of action, dosing, administration, adverse effects, and monitoring parameters of Esbriet.  Patient verbalized understanding.   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 It will slow the progression of ILD.   Dosing: Starting dose will be Esbriet 267 mg 1 tablet three times daily for 7 days, then 2 tablets three times daily for 7 days, then 3 tablets three times daily.  Maintenance dose will be 801 mg 1 tablet three times daily if tolerated.  Stressed the importance of taking with meals and space at least 5-6 hours apart to minimize stomach upset.   Adverse Effects: Nausea, vomiting, diarrhea, weight loss Abdominal pain GERD Sun sensitivity/rash - patient advised to wear sunscreen when exposed to sunlight Dizziness Fatigue  Monitoring: Monitor for diarrhea, nausea and vomiting, GI perforation, hepatotoxicity  Monitor LFTs - baseline, monthly for first 6 months, then every 3 months routinely. Standing orders place  Access: Approval of pirfenidone through: insurance and grant Rx sent to: [facility]: [phone]  Routing to [facility]  Medication Reconciliation A drug regimen assessment was performed, including review of allergies, interactions, disease-state management, dosing and immunization history. Medications were reviewed with the patient, including name, instructions, indication, goals of therapy, potential side effects, importance of adherence, and safe use.  Thank you for  involving pharmacy to assist  in providing this patient's care.    Chesley Mires, PharmD, MPH, BCPS, CPP Clinical Pharmacist (Rheumatology and Pulmonology)

## 2023-04-29 ENCOUNTER — Ambulatory Visit: Payer: Medicare Other | Admitting: Family

## 2023-05-01 ENCOUNTER — Other Ambulatory Visit: Payer: Self-pay | Admitting: Pulmonary Disease

## 2023-05-06 ENCOUNTER — Other Ambulatory Visit (HOSPITAL_COMMUNITY): Payer: Self-pay

## 2023-05-06 ENCOUNTER — Other Ambulatory Visit: Payer: Self-pay

## 2023-05-06 NOTE — Telephone Encounter (Signed)
Delivery instructions have been updated in [facility], medication will be shipped to patient's home address on 9/17/214.  Rx has been processed in [facility] and the patient has no copay at this time.

## 2023-05-07 ENCOUNTER — Other Ambulatory Visit: Payer: Self-pay

## 2023-05-07 ENCOUNTER — Other Ambulatory Visit (HOSPITAL_COMMUNITY): Payer: Self-pay

## 2023-05-07 ENCOUNTER — Ambulatory Visit (INDEPENDENT_AMBULATORY_CARE_PROVIDER_SITE_OTHER): Payer: Medicare Other | Admitting: Pulmonary Disease

## 2023-05-07 ENCOUNTER — Ambulatory Visit: Payer: Self-pay | Admitting: Licensed Clinical Social Worker

## 2023-05-07 ENCOUNTER — Encounter: Payer: Self-pay | Admitting: Pulmonary Disease

## 2023-05-07 VITALS — BP 110/70 | HR 85 | Ht 66.5 in | Wt 221.0 lb

## 2023-05-07 DIAGNOSIS — G4733 Obstructive sleep apnea (adult) (pediatric): Secondary | ICD-10-CM | POA: Diagnosis not present

## 2023-05-07 DIAGNOSIS — G478 Other sleep disorders: Secondary | ICD-10-CM | POA: Diagnosis not present

## 2023-05-07 DIAGNOSIS — G4719 Other hypersomnia: Secondary | ICD-10-CM

## 2023-05-07 NOTE — Patient Instructions (Signed)
Visit Information  Thank you for taking time to visit with me today. Please don't hesitate to contact me if I can be of assistance to you.   Following are the goals we discussed today:   Goals Addressed             This Visit's Progress    Patient stated she has stress in managing medical needs.       Interventions:    Spoke with client via phone today about her current status Discussed medication procurement of client.  Discussed client use of oxygen. She uses oxygen as prescribed to help her with her breathing. Client said she has meeting with nurse today to discuss nursing needs Client asked for return call from LCSW.  LCSW said he would ask Care Guide to reschedule LCSW phone visit with client.  Client agreed to this plan LCSW thanked client for phone call with LCSW today        Care Guide to reschedule LCSW phone visit with client   Please call the care guide team at [phone] if you need to cancel or reschedule your appointment.   If you are experiencing a Mental Health or Behavioral Health Crisis or need someone to talk to, please go to [facility] [address] [place] [phone])   The patient verbalized understanding of instructions, educational materials, and care plan provided today and DECLINED offer to receive copy of patient instructions, educational materials, and care plan.   The patient has been provided with contact information for the care management team and has been advised to call with any health related questions or concerns.   Kelton Pillar.Karielle Davidow MSW, LCSW Licensed Visual merchandiser [facility] Care Management [phone]

## 2023-05-07 NOTE — Progress Notes (Signed)
[name]    [id number]    02/21/1951  Primary Care Physician:Stephens, [name], NP  Referring Physician: Colin Ina, FNP [address] [place],  [place] [address]  Chief complaint:   Patient being seen for concern for obstructive sleep apnea  HPI:  Patient with a history of obstructive sleep apnea Has not been using CPAP  Following up with Dr. [name] for pulmonary fibrosis  Was diagnosed with obstructive sleep apnea about 8 to 9 years ago recently had COVID in July and followed by pulmonary embolism Has not been back to using CPAP on a regular basis since then Did have difficulty tolerating it prior to that as well  She has oxygen to use at night but she has not been using much of it She does use oxygen with activity  Admits to dryness of the mouth in the morning Occasional headaches  Usually goes to bed at about 3 AM Wakes up between 11 and 12 midday  She feels rested when she wakes up She continues to snore and wakes herself up snoring sometimes  DME company is [employer]  Outpatient Encounter Medications as of 05/07/2023  Medication Sig   albuterol (VENTOLIN HFA) 108 (90 Base) MCG/ACT inhaler Inhale 2 puffs into the lungs every 6 (six) hours as needed for wheezing or shortness of breath.   azelastine (ASTELIN) 0.1 % nasal spray Place 1 spray into both nostrils 2 (two) times daily. Use in each nostril as directed   benzonatate (TESSALON) 200 MG capsule Take 1 capsule (200 mg total) by mouth 3 (three) times daily as needed for cough.   budesonide-formoterol (SYMBICORT) 80-4.5 MCG/ACT inhaler Inhale 1 puff into the lungs 2 (two) times daily.   Cholecalciferol (DIALYVITE VITAMIN D 5000) 125 MCG (5000 UT) capsule Take 5,000 Units by mouth daily.   cloBAZam (ONFI) 10 MG tablet Take 1 tablet (10 mg total) by mouth 2 (two) times daily.   dabigatran (PRADAXA) 150 MG CAPS capsule Take 1 capsule (150 mg total) by mouth 2 (two) times daily.    diltiazem (CARDIZEM CD) 120 MG 24 hr capsule Take 1 capsule (120 mg total) by mouth daily.   empagliflozin (JARDIANCE) 10 MG TABS tablet Take 1 tablet (10 mg total) by mouth daily.   Evolocumab (REPATHA SURECLICK) 140 MG/ML SOAJ Inject 140 mg into the skin every 14 (fourteen) days.   famotidine (PEPCID) 40 MG tablet Take 1 tablet (40 mg total) by mouth at bedtime. NEEDS APPT FOR FURTHER REFILLS   fexofenadine (ALLEGRA) 180 MG tablet Take 180 mg by mouth daily as needed for allergies (alternates with zyrtec sometimes).   fluticasone (FLONASE) 50 MCG/ACT nasal spray Place 2 sprays into both nostrils daily as needed for allergies or rhinitis.   FOLIC ACID PO Take 1 tablet by mouth daily.   furosemide (LASIX) 20 MG tablet TAKE 2 TABLETS (40 MG) 3 DAYS PER WEEK AND TAKE 1 TABLET (20 MG) THE OTHER DAYS OF THE WEEK (Patient taking differently: Take 20 mg by mouth daily as needed. PER PT TAKES PRN)   hydroquinone 4 % cream Apply 1 Application topically at bedtime.   hydroxypropyl methylcellulose / hypromellose (ISOPTO TEARS / GONIOVISC) 2.5 % ophthalmic solution Place 1 drop into both eyes at bedtime.   latanoprost (XALATAN) 0.005 % ophthalmic solution Place 1 drop into both eyes at bedtime.   levocetirizine (XYZAL) 5 MG tablet Take 5 mg by mouth daily at 12 noon.   levothyroxine (  SYNTHROID) 50 MCG tablet TAKE 1 TABLET EVERY DAY   magnesium oxide (MAG-OX) 400 (240 Mg) MG tablet TAKE 1 TABLET TWICE DAILY   montelukast (SINGULAIR) 10 MG tablet TAKE 1 TABLET AT BEDTIME   Multiple Vitamins-Minerals (MULTIVITAMIN PO) Take 1 tablet by mouth daily.   pantoprazole (PROTONIX) 40 MG tablet TAKE 1 TABLET EVERY DAY   Pirfenidone 267 MG TABS Take 1 tab three times daily for 7 days, then 2 tabs three times daily for 7 days, then 3 tabs three times daily thereafter.   Pirfenidone 267 MG TABS Take 3 tablets (801 mg total) by mouth 3 (three) times daily with meals.   polyethylene glycol powder (GLYCOLAX/MIRALAX) powder  Take 17 grams by mouth daily   potassium chloride (KLOR-CON M) 10 MEQ tablet TAKE 1/2 TABLET EVERY DAY (CALL OFFICE TO SCHEDULE YEARLY APPT)   promethazine-dextromethorphan (PROMETHAZINE-DM) 6.25-15 MG/5ML syrup Take 5 mLs by mouth 4 (four) times daily as needed for cough.   Respiratory Therapy Supplies (FLUTTER) DEVI 1 Device by Does not apply route as needed.   sodium chloride HYPERTONIC 3 % nebulizer solution Take by nebulization 2 (two) times daily as needed for cough. 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   tiZANidine (ZANAFLEX) 4 MG tablet Take 0.5-1 tablets (2-4 mg total) by mouth 2 (two) times daily.   traZODone (DESYREL) 100 MG tablet TAKE 1 TO 1 AND 1/2 TABLETS AT BEDTIME   Facility-Administered Encounter Medications as of 05/07/2023  Medication   lidocaine (XYLOCAINE) 1 % (with pres) injection 1 mL   lidocaine (XYLOCAINE) 1 % (with pres) injection 3 mL    Allergies as of 05/07/2023 - Review Complete 05/07/2023  Allergen Reaction Noted   Ace inhibitors Swelling 02/18/2014   Amitriptyline hcl Other (See Comments) 04/18/2007   Atorvastatin Other (See Comments) 04/18/2007   Dilantin [phenytoin sodium extended] Rash 02/19/2012   Duloxetine hcl Nausea Only and Other (See Comments) 12/09/2018   Paroxetine Nausea Only 04/18/2007   Ramipril Other (See Comments) 12/01/2014   Rosuvastatin Other (See Comments) 04/18/2007   Carbamazepine Rash 04/18/2007   Codeine Itching 04/18/2007   Phenytoin sodium extended Rash 02/19/2012   Simvastatin Other (See Comments) 04/20/2021   Wellbutrin [bupropion] Palpitations 02/24/2018    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   Bronchiectasis (HCC)    Cerebral aneurysm, nonruptured    had a clip put in   CHF (congestive heart failure) (HCC)    Clostridium difficile infection     Depressive disorder, not elsewhere classified    Diverticulosis of colon (without mention of hemorrhage)    Esophageal reflux    Fatty liver    Fibromyalgia    Gastritis    GERD (gastroesophageal reflux disease)    GI bleed 2004   Glaucoma    Hiatal hernia    History of COVID-19 05/04/2021   Hyperlipidemia    Hypertension    Hypothyroidism    Internal hemorrhoids    Joint pain    Multifocal pneumonia 06/03/2021   Obstructive sleep apnea (adult) (pediatric)    Osteoarthritis    Ostium secundum type atrial septal defect    Other chronic nonalcoholic liver disease    Other pulmonary embolism and infarction    Palpitations    Paroxysmal ventricular tachycardia (HCC)    Personal history of radiation  therapy    PONV (postoperative nausea and vomiting)    Presence of permanent cardiac pacemaker    PUD (peptic ulcer disease)    Radiation 02/03/15-03/10/15   Right Breast   Sarcoid    per pt , not sure   Schatzki's ring    Shortness of breath    Sleep apnea    Stroke [facility]) 2013   tia/ pt feels it was around 2008 0r 2009   Takotsubo syndrome    Tubular adenoma of colon    Unspecified transient cerebral ischemia    Unspecified vitamin D deficiency     Past Surgical History:  Procedure Laterality Date   ABDOMINAL HYSTERECTOMY  11/22/1998   ABI  2006   normal   BREAST EXCISIONAL BIOPSY     BREAST LUMPECTOMY Right 2016   BREAST LUMPECTOMY WITH RADIOACTIVE SEED LOCALIZATION Right 01/03/2015   Procedure: BREAST LUMPECTOMY WITH RADIOACTIVE SEED LOCALIZATION;  Surgeon: Chevis Pretty III, MD;  Location: [facility] OR;  Service: General;  Laterality: Right;   CARDIAC DEFIBRILLATOR PLACEMENT  2006; 2012   BSX single chamber ICD   carotid dopplers  2006   neg   CEREBRAL ANEURYSM REPAIR  02/1999   COLONOSCOPY     EYE SURGERY     HAMMER TOE SURGERY  11/15/2020   3rd digit bilateral feet    hospitalization  2004   GI bleed, PUD, diverticulosis (EGD,colonscopy)   hospitalization     PE, NSVT,  s/p defib   JOINT REPLACEMENT  05/2021   KNEE ARTHROSCOPY     bilateral   LEFT HEART CATHETERIZATION WITH CORONARY ANGIOGRAM N/A 02/22/2012   Procedure: LEFT HEART CATHETERIZATION WITH CORONARY ANGIOGRAM;  Surgeon: Kathleene Hazel, MD;  Location: [facility] CATH LAB;  Service: Cardiovascular;  Laterality: N/A;   PARTIAL HYSTERECTOMY     Fibroids   TONGUE BIOPSY  12/12/2017   due to sore tongue and white patches/abnormal cells   TOTAL KNEE ARTHROPLASTY Left 02/13/2016   Procedure: TOTAL KNEE ARTHROPLASTY;  Surgeon: Dannielle Huh, MD;  Location: [facility] OR;  Service: Orthopedics;  Laterality: Left;   TOTAL KNEE ARTHROPLASTY Right 05/22/2021   Procedure: TOTAL KNEE ARTHROPLASTY;  Surgeon: Dannielle Huh, MD;  Location: [facility] ORS;  Service: Orthopedics;  Laterality: Right;  with block   TUBAL LIGATION  09/20/1978   UPPER GASTROINTESTINAL ENDOSCOPY      Family History  Problem Relation Age of Onset   Diabetes Mother    Alzheimer's disease Mother    Hypertension Mother    Obesity Mother    Arthritis Mother    Heart disease Father    Seizures Sister    Allergies Sister    Heart disease Sister    Early death Sister    Other Brother        Thyroid problem 10/2016   Hearing loss Brother    Vision loss Brother    Heart disease Brother    Prostate cancer Brother 73       same brother as throat cancer   Throat cancer Brother        dx in his 55s; also a smoker   Arthritis Brother    Hypertension Brother    Cancer Maternal Grandmother 10       colon cancer or abdominal cancer   Lung cancer Maternal Grandfather 78   Heart disease Son        Cardiac Arrest 07/2016   Breast cancer Maternal Aunt 55   Colon cancer Maternal Aunt 61  same sister as breast at 8   Breast cancer Maternal Aunt        dx in her 49s   Lung cancer Maternal Uncle    Pancreatic cancer Cousin 31       maternal first cousin   Breast cancer Cousin        paternal first cousin twice removed died in her 21s   Anxiety  disorder Sister    Arthritis Sister    Depression Sister    Hypertension Sister    Stroke Neg Hx     Social History   Socioeconomic History   Marital status: Widowed    Spouse name: Not on file   Number of children: 2   Years of education: 14   Highest education level: Associate degree: occupational, Scientist, product/process development, or vocational program  Occupational History   Occupation: DISABLED  Tobacco Use   Smoking status: Former    Current packs/day: 0.00    Average packs/day: 1 pack/day for 20.0 years (20.0 ttl pk-yrs)    Types: Cigarettes    Start date: 08/20/1969    Quit date: 08/20/1989    Years since quitting: 33.7    Passive exposure: Past   Smokeless tobacco: Never   Tobacco comments:    Mother, brother and sister  Vaping Use   Vaping status: Never Used  Substance and Sexual Activity   Alcohol use: Yes    Alcohol/week: 1.0 standard drink of alcohol    Types: 1 Glasses of wine per week    Comment: occasionally, not on a weekly basis   Drug use: Not Currently    Frequency: 1.0 times per week    Comment: CBD gummy occ; marijuana occasionally   Sexual activity: Not Currently    Birth control/protection: Abstinence  Other Topics Concern   Not on file  Social History Narrative   Lives alone   Caffeine IEP:PIRJ   Retired from [employer]   2 children -- 5 grandbabies - 1 great-grandbaby    Social Determinants of Health   Financial Resource Strain: Low Risk  (01/11/2023)   Overall Financial Resource Strain (CARDIA)    Difficulty of Paying Living Expenses: Not very hard  Recent Concern: Financial Resource Strain - Medium Risk (12/25/2022)   Overall Financial Resource Strain (CARDIA)    Difficulty of Paying Living Expenses: Somewhat hard  Food Insecurity: No Food Insecurity (04/05/2023)   Hunger Vital Sign    Worried About Running Out of Food in the Last Year: Never true    Ran Out of Food in the Last Year: Never true  Transportation Needs: No Transportation Needs (04/05/2023)    PRAPARE - Administrator, Civil Service (Medical): No    Lack of Transportation (Non-Medical): No  Physical Activity: Inactive (05/07/2023)   Exercise Vital Sign    Days of Exercise per Week: 0 days    Minutes of Exercise per Session: 0 min  Stress: Stress Concern Present (05/07/2023)   [employer] of Occupational Health - Occupational Stress Questionnaire    Feeling of Stress : To some extent  Social Connections: Unknown (04/12/2023)   [facility]   Social Network    Social Network: Not on file  Intimate Partner Violence: Unknown (04/12/2023)   Received from [facility]   HITS    Physically Hurt: Not on file    Insult or Talk Down To: Not on file    Threaten Physical Harm: Not on file    Scream or Curse: Not  on file    Review of Systems  Constitutional:  Positive for fatigue.  Respiratory:  Positive for apnea and shortness of breath.   Psychiatric/Behavioral:  Positive for sleep disturbance.     Vitals:   05/07/23 1309  BP: 110/70  Pulse: 85  SpO2: 97%     Physical Exam Constitutional:      Appearance: She is obese.  HENT:     Head: Normocephalic.     Nose: Nose normal.     Mouth/Throat:     Mouth: Mucous membranes are moist.  Eyes:     Pupils: Pupils are equal, round, and reactive to light.  Cardiovascular:     Rate and Rhythm: Normal rate and regular rhythm.     Heart sounds: No murmur heard.    No friction rub.  Pulmonary:     Effort: No respiratory distress.     Breath sounds: No stridor. Rales present. No wheezing or rhonchi.  Musculoskeletal:     Cervical back: No rigidity or tenderness.  Neurological:     Mental Status: She is alert.  Psychiatric:        Mood and Affect: Mood normal.      Data Reviewed: Sleep study was from 2017-reviewed by myself showing mild obstructive sleep apnea with mild oxygen desaturations  Compliance data reviewed showing she has been trying to use of the last few days Leaving the mask  on for about 5 and half hours CPAP of 5 Residual AHI of 4.5 She does have some mask leaks  Assessment:  History of mild obstructive sleep apnea -Has been trying to get back to using CPAP  -She will give it a good effort If unable to tolerate CPAP  She may need a repeat sleep study to ascertain whether she still has significant obstructive sleep apnea  History of pulmonary fibrosis -On pirfenidone -Follows up with Dr. [name]  History of bronchiectasis  Pathophysiology of sleep disordered breathing was reviewed with the patient Treatment options reviewed with the patient Risk with not treating sleep disordered breathing reviewed with the patient  Plan/Recommendations: Encouraged to continue using CPAP at present to see whether she is able to get back to using it regularly  If unable to tolerate, may need a new sleep study to ascertain whether she still has significant sleep disordered breathing that merits treatment  Continue follow-up for pulmonary fibrosis  Graded activities as tolerated  Encourage weight loss efforts  Encouraged to optimize sleep hygiene as best as possible   Virl Diamond MD [facility] 05/07/2023, 3:43 PM  CC: Melodye Ped Moorin*

## 2023-05-07 NOTE — Patient Instructions (Signed)
Continue using your CPAP nightly   DME referral for new mask  Schedule for pulmonary function test  Adjust your oxygen as needed to keep your saturations greater than 90% when you are exercising  Start regular exercises  Once we get the breathing study we will be able to place an order for pulmonary rehab  Make sure you are getting at least about 7 hours of sleep every night  Call us with significant concerns  Follow-up in about 6 to 8 weeks

## 2023-05-07 NOTE — Patient Outreach (Signed)
Care Coordination   Follow Up Visit Note   05/07/2023 Name: [name] MRN: [id number] DOB: 1950/09/25  [name] is a 72 y.o. year old female who sees Adriana Fendt, NP for primary care. I spoke with  [name] by phone today.  What matters to the patients health and wellness today?  Patient has stress in managing medical needs    Goals Addressed             This Visit's Progress    Patient stated she has stress in managing medical needs.       Interventions:    Spoke with client via phone today about her current status Discussed medication procurement of client.  Discussed client use of oxygen. She uses oxygen as prescribed to help her with her breathing. Client said she has meeting with nurse today to discuss nursing needs Client asked for return call from LCSW.  LCSW said he would ask Care Guide to reschedule LCSW phone visit with client.  Client agreed to this plan LCSW thanked client for phone call with LCSW today          SDOH assessments and interventions completed:  Yes  SDOH Interventions Today    Flowsheet Row Most Recent Value  SDOH Interventions   Depression Interventions/Treatment  Counseling  Physical Activity Interventions Other (Comments)  [some mobility challenges]  Stress Interventions Provide Counseling  [has stress in managing medical needs]        Care Coordination Interventions:  Yes, provided   Interventions Today    Flowsheet Row Most Recent Value  Chronic Disease   Chronic disease during today's visit Other  [spoke with client about client needs]  General Interventions   General Interventions Discussed/Reviewed General Interventions Discussed, Community Resources  Education Interventions   Education Provided Provided Education  Provided Verbal Education On [facility]  Mental Health Interventions   Mental Health Discussed/Reviewed Coping Strategies  [client has stress occasionally in managing  medical needs]  Pharmacy Interventions   Pharmacy Dicussed/Reviewed Pharmacy Topics Discussed        Follow up plan:  Care Guide to reschedule LCSW phone visit with client   Encounter Outcome:  Patient Visit Completed   Kelton Pillar.Fredrika Canby MSW, LCSW Licensed Visual merchandiser [facility] Care Management [phone]

## 2023-05-08 ENCOUNTER — Other Ambulatory Visit (HOSPITAL_COMMUNITY): Payer: Self-pay

## 2023-05-09 ENCOUNTER — Encounter: Payer: Self-pay | Admitting: Podiatry

## 2023-05-09 ENCOUNTER — Ambulatory Visit (INDEPENDENT_AMBULATORY_CARE_PROVIDER_SITE_OTHER): Payer: Medicare Other | Admitting: Podiatry

## 2023-05-09 VITALS — BP 131/79 | HR 88

## 2023-05-09 DIAGNOSIS — L84 Corns and callosities: Secondary | ICD-10-CM | POA: Diagnosis not present

## 2023-05-09 DIAGNOSIS — M79674 Pain in right toe(s): Secondary | ICD-10-CM

## 2023-05-09 DIAGNOSIS — D689 Coagulation defect, unspecified: Secondary | ICD-10-CM | POA: Diagnosis not present

## 2023-05-09 DIAGNOSIS — M79675 Pain in left toe(s): Secondary | ICD-10-CM | POA: Diagnosis not present

## 2023-05-09 DIAGNOSIS — B351 Tinea unguium: Secondary | ICD-10-CM | POA: Diagnosis not present

## 2023-05-09 NOTE — Progress Notes (Signed)
Subjective:   Patient ID: [name], female   DOB: 72 y.o.   MRN: [id number]   HPI Patient presents high risk on blood thinner now with thick keratotic lesions of the second and fifth metatarsals bilateral and thick yellow brittle nailbeds 1-5 both feet that are painful with patient taking blood thinner   ROS      Objective:  Physical Exam  Neurovascular status unchanged history of pulmonary embolism recently with thick yellow brittle nailbeds 1-5 both feet painful and lesions bilateral painful when pressed chronic     Assessment:  Nail disease chronic lesions high risk due to blood thinner     Plan:  H&P reviewed debrided painful nailbeds 1-5 both feet no angiogenic bleeding debrided lesions bilateral no angiogenic bleeding reappoint routine care

## 2023-05-10 ENCOUNTER — Ambulatory Visit: Payer: Medicare Other | Admitting: Interventional Cardiology

## 2023-05-10 ENCOUNTER — Encounter: Payer: Self-pay | Admitting: Internal Medicine

## 2023-05-15 ENCOUNTER — Encounter
Payer: Medicare Other | Attending: Physical Medicine and Rehabilitation | Admitting: Physical Medicine and Rehabilitation

## 2023-05-15 ENCOUNTER — Encounter: Payer: Self-pay | Admitting: Physical Medicine and Rehabilitation

## 2023-05-15 VITALS — BP 119/83 | HR 86 | Ht 66.5 in | Wt 221.0 lb

## 2023-05-15 DIAGNOSIS — M7918 Myalgia, other site: Secondary | ICD-10-CM | POA: Insufficient documentation

## 2023-05-15 DIAGNOSIS — G894 Chronic pain syndrome: Secondary | ICD-10-CM | POA: Diagnosis not present

## 2023-05-15 DIAGNOSIS — J84112 Idiopathic pulmonary fibrosis: Secondary | ICD-10-CM | POA: Insufficient documentation

## 2023-05-15 DIAGNOSIS — M62838 Other muscle spasm: Secondary | ICD-10-CM | POA: Insufficient documentation

## 2023-05-15 NOTE — Patient Instructions (Addendum)
Plan: Discussed how barometric pressure and humidity can change need for O2.  Breathing usually worse with lower barometric pressure and higher humidity.    2.  Got new PCP- working OK.    3.  Needs to talk to PCP about a medical work up for memory.  To make sure there's no reversible causes of memory issues.   4.  Cannot find anything about Tylenol- except to stop when LFTs- elevation- have texted via my chart secure chat- he agreed pt can have LOW DOSE 325 mg no more than 2x/week for Headaches would be reasonable.    5. We are kind of stuck- can do low dose ~ 2x/week tylenol, but otherwise, we need to go to Oxycodone for pain. .  Pt doesn't want to go to Oxycodone at this time.   6.   Will need to stop Trazodone and call PCP for other sleep options.    7.  F/U- 3 months- call me if needs trigger point injections.   8. Reifll Tizanidine- actually refilled 7/24

## 2023-05-15 NOTE — Progress Notes (Signed)
Patient is a 72 yr old R handed female with hx of  Bronchiesctasis, CHF, prediabetes- with A1c of 5.6- as of 02/15/21- - not on blood thinners, has a defibrillator, GERD, and fibromyalgia issues- dx'd 10 years ago.  Here for f/u on chronic pain and trigger oint injections. Last got subacromial shoulder injections B/L on 03/29/21.  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  Here for f/u on pain.    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 -   Concerned about injections adding new meds to current meds.    Didn't get ESI-  Cannot come off blood thinners to do this.  New O2-  had COVID in July and then Pe's in August- and had to get back on O2-  On with any movement not when sitting there.  Sometimes gets around house without it.   Got new PCP- seems very professional-  [name]- PA with [facility].   Memory slightly worse-    Having more HA's-  Hesitant to try since on seizure medicine.  Last liver functions were perfect-  Alk phos 109, AST 27 and ALT 35-  Pulmonary says cannot take Tylenol Cannot take Tramadol due to seizure meds Cannot take NSAIDs due ot being on blood thinner.   Trazodone- vivid dreams.  Worse since sick.    Plan: Discussed how barometric pressure and humidity can change need for O2.  Breathing usually worse with lower barometric pressure and higher humidity.    2.  Got new PCP- working OK.    3.  Needs to talk to PCP about a medical work up for memory.  To make sure there's no reversible causes of memory issues.   4.  Cannot find anything about Tylenol- except to stop when LFTs- elevation- have texted via my chart secure chat- he agreed pt can have LOW DOSE 325 mg no more than 2x/week for Headaches would be reasonable.    5. We are kind of stuck- can do low dose ~ 2x/week tylenol, but otherwise, we need to go to Oxycodone for pain. .  Pt doesn't want to  go to Oxycodone at this time.   6.   Will need to stop Trazodone and call PCP for other sleep options.    7.  F/U- 3 months- call me if needs trigger point injections.     I spent a total of  34  minutes on total care today- >50% coordination of care- due to d/w Pulmonary about yelnol need due ot LFT elevation- spent 25 minute son this topic.

## 2023-05-16 ENCOUNTER — Telehealth: Payer: Self-pay | Admitting: *Deleted

## 2023-05-16 ENCOUNTER — Other Ambulatory Visit (HOSPITAL_COMMUNITY): Payer: Self-pay

## 2023-05-16 NOTE — Progress Notes (Signed)
Care Coordination Note  05/16/2023 Name: [name] MRN: [id number] DOB: 1951-06-27  [name] is a 72 y.o. year old female who is a primary care patient of [name], NP and is actively engaged with the care management team. I reached out to [name] by phone today to assist with re-scheduling a follow up visit with the Licensed Clinical Social Worker  Follow up plan: Unsuccessful telephone outreach attempt made. A HIPAA compliant phone message was left for the patient providing contact information and requesting a return call.   [facility]  Care Coordination Care Guide  Direct Dial: [phone]

## 2023-05-17 NOTE — Progress Notes (Signed)
Care Coordination Note  05/17/2023 Name: Valaria Delsignore MRN: [id number] DOB: [birth year]  Vannessa Neeser is a 72 y.o. year old female who is a primary care patient of [name], NP and is actively engaged with the care management team. I reached out to [name] by phone today to assist with re-scheduling a follow up visit with the RN Case Manager  Follow up plan: Telephone appointment with care management team member scheduled for:10/29  [facility] Coordination Care Guide  Direct Dial: [phone]

## 2023-05-23 ENCOUNTER — Encounter: Payer: Self-pay | Admitting: Internal Medicine

## 2023-05-23 ENCOUNTER — Ambulatory Visit: Payer: Medicare Other | Admitting: Internal Medicine

## 2023-05-23 ENCOUNTER — Telehealth: Payer: Self-pay | Admitting: Internal Medicine

## 2023-05-23 VITALS — BP 108/73 | HR 88 | Temp 98.1°F | Ht 66.5 in | Wt 219.0 lb

## 2023-05-23 DIAGNOSIS — R42 Dizziness and giddiness: Secondary | ICD-10-CM

## 2023-05-23 DIAGNOSIS — G4733 Obstructive sleep apnea (adult) (pediatric): Secondary | ICD-10-CM

## 2023-05-23 DIAGNOSIS — Z7184 Encounter for health counseling related to travel: Secondary | ICD-10-CM

## 2023-05-23 DIAGNOSIS — Z86711 Personal history of pulmonary embolism: Secondary | ICD-10-CM

## 2023-05-23 DIAGNOSIS — J84112 Idiopathic pulmonary fibrosis: Secondary | ICD-10-CM

## 2023-05-23 DIAGNOSIS — Z23 Encounter for immunization: Secondary | ICD-10-CM | POA: Diagnosis not present

## 2023-05-23 DIAGNOSIS — Z5181 Encounter for therapeutic drug level monitoring: Secondary | ICD-10-CM

## 2023-05-23 DIAGNOSIS — R7989 Other specified abnormal findings of blood chemistry: Secondary | ICD-10-CM

## 2023-05-23 DIAGNOSIS — R070 Pain in throat: Secondary | ICD-10-CM

## 2023-05-23 DIAGNOSIS — Z8616 Personal history of COVID-19: Secondary | ICD-10-CM

## 2023-05-23 LAB — HEPATIC FUNCTION PANEL
ALT: 32 U/L (ref 0–35)
AST: 34 U/L (ref 0–37)
Albumin: 4.2 g/dL (ref 3.5–5.2)
Alkaline Phosphatase: 124 U/L — ABNORMAL HIGH (ref 39–117)
Bilirubin, Direct: 0.1 mg/dL (ref 0.0–0.3)
Total Bilirubin: 0.5 mg/dL (ref 0.2–1.2)
Total Protein: 7.7 g/dL (ref 6.0–8.3)

## 2023-05-23 LAB — D-DIMER, QUANTITATIVE: D-Dimer, Quant: 0.96 ug{FEU}/mL — ABNORMAL HIGH (ref <0.50)

## 2023-05-23 NOTE — Addendum Note (Signed)
Addended by: Christen Butter on: 05/23/2023 04:20 PM   Modules accepted: Orders

## 2023-05-23 NOTE — Progress Notes (Addendum)
There are no aggressive appearing lytic or blastic lesions noted in the visualized portions of the skeleton.   IMPRESSION: 1. 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 2. Aortic atherosclerosis, in addition to left anterior  descending coronary artery disease. Assessment for potential risk factor modification, dietary therapy or pharmacologic therapy may be warranted, if clinically indicated. 3. Colonic diverticulosis without evidence of acute diverticulitis at this time.   Aortic Atherosclerosis (ICD10-I70.0).     Electronically Signed   By: Trudie Reed M.D.   On: 07/08/2022 10:54     OV 11/22/2022  Subjective:  Patient ID: [name], female , DOB: 1951-02-04 , age 62 y.o. , MRN: [id number] , ADDRESS: [address] [place] [address] PCP Georganna Skeans, MD Patient Care Team: Georganna Skeans, MD as PCP - General (Family Medicine) Duke Salvia, MD as PCP - Electrophysiology (Cardiology) Corky Crafts, MD as PCP - Cardiology (Cardiology) Griselda Miner, MD as Consulting Physician (General Surgery) Malachy Mood, MD as Consulting Physician (Hematology) Lurline Hare, MD as Consulting Physician (Radiation Oncology) Donnelly Angelica, RN as Registered Nurse Pershing Proud, RN as Registered Nurse Duke Salvia, MD as Consulting Physician (Cardiology) Hubbard Hartshorn, NP (Inactive) as Nurse Practitioner (Nurse Practitioner) [name], NP as Nurse Practitioner (Nurse Practitioner) Coralyn Helling, MD as Consulting Physician (Pulmonary Disease) Griselda Miner, MD as Consulting Physician (General Surgery) Pollyann Savoy, MD as Consulting Physician (Rheumatology)  This Provider for this visit: Treatment Team:  Attending Provider: Kalman Shan, MD    11/22/2022 -   Chief Complaint  Patient presents with   Follow-up    F/u pft 11/15/22. Labs,      HPI Tayonna Bacha 72 y.o. -returns for follow-up.  This is potential ILD workup in progress.  She states in the interim her son who is age 88 did have a heart transplant for congestive heart failure 3 weeks ago at [facility] he is home and is improving.  She is happy about that.  She also  reports that after her COVID she was prescribed Symbicort she is run out of the Symbicort for the last 2 weeks but this is not really bothering her.  She feels stable she has occasional cough.  Be here to review the results.  She had pulmonary function test that shows now a consistent pattern of decline her FVC is declined 9% in the last 4 years.  I went back and visualized all her CT scans of the chest.  Her CT scan in 2013 and a high-resolution CT chest in 2020 look about similar with some bronchiectasis.  However there is dramatic change between 2020 and also the high-resolution CT chest and late 2023.  This seems a lot more traction bronchiectasis.  There is definite craniocaudal gradient.  She has crackles here.  Initially I was not sure if this was classic UIP but clinically this makes sense especially of the lower lobe crackles.  Was supposed to discuss this in the case conference but this did not happen.  Every posted for discussion of the case conference but nevertheless his decline in PFTs and also this change now in the CT scan of the chest.  In the interim his serologies are normal.  I think at the least she has some plan for progressive ILD .  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 Craige Cotta to Dr. [name] at the pulmonary fibrosis center.  Subjective:  Patient ID: [name], female , DOB: 10/13/1950 , age 10 y.o. , MRN: [id number] , ADDRESS: [address] [place] [address] PCP Georganna Skeans, MD Patient Care Team: Georganna Skeans, MD as PCP - General (Family Medicine) Duke Salvia, MD as PCP - Electrophysiology (Cardiology) Corky Crafts, MD as PCP - Cardiology (Cardiology) Griselda Miner, MD as Consulting Physician (General Surgery) Malachy Mood, MD as Consulting Physician (Hematology) Lurline Hare, MD as Consulting Physician (Radiation Oncology) Donnelly Angelica, RN as Registered Nurse Pershing Proud, RN as Registered Nurse Duke Salvia, MD as Consulting Physician (Cardiology) Hubbard Hartshorn, NP (Inactive) as Nurse Practitioner (Nurse Practitioner) [name], NP as Nurse Practitioner (Nurse Practitioner) Coralyn Helling, MD as Consulting Physician (Pulmonary Disease) Griselda Miner, MD as Consulting Physician (General Surgery) Pollyann Savoy, MD as Consulting Physician (Rheumatology)  This Provider for this visit: Treatment Team:  Attending Provider: Kalman Shan, MD    08/23/2022 -   Chief Complaint  Patient presents with   Consult    Referred for ILD, states DOE     HPI [facility] 72 y.o. -referred by Dr. Craige Cotta to Dr. [name] the fibrosis center.  History is gained by review of the chart, talking to the patient in the ILD questionnaire that the patient fell.  There is no one accompanying the patient.  She has been following with Dr. Craige Cotta at least for the last 3 to 4 years for a diagnosis of bronchiectasis.  She says at baseline she is doing fairly well but then in October 2022 she suffered from COVID-19 and then was hospitalized.  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   Then a few weeks later on 06/03/2021 she was hospitalized for worsening respiratory failure which she believes was because of COVID-19 but review the records indicate that she actually had COVID in September 2022 and was treated with Paxlovid as an outpatient.  At this hospitalization the COVID was negative.  She was actually hypoxemic.  Pulmonary embolism was ruled out.  This admission RVP and COVID were normal.  Procalcitonin was normal.  Final diagnosis was acute on chronic hypoxemic respiratory failure due to bronchiectasis and pulmonary fibrosis.  She says since then she has been gradually weaned of oxygen.  She says overall she is better since the hospitalization October 2022 but she is not back to her baseline from before the hospitalization.  She has dyspnea on exertion.  Most recently November 2023 Dr. Craige Cotta did high-resolution CT scan of the chest that is concerning for honeycombing findings and progressive changes [personally visualized and I do not fully agree].  Therefore she has been referred here.  Pulmonary function testing shows still blood in DLCO but the FVC seems reduced recently.   All the question is as follows  Most recently she is use 2 L of oxygen with exertion.   [facility] [facility] Comprehensive ILD Questionnaire  Symptoms:  Other than dyspnea she also has a cough.  The cough is a since December 2023.  Although currently it is not there.   Past Medical History :  -She marked positive for COPD according to history but does not not be correct. -She is have OSA for which she is previous Dr. Craige Cotta - She does have a longstanding  OV 08/23/2022 -refer by Dr. Craige Cotta to Dr. [name] at the pulmonary fibrosis center.  Subjective:  Patient ID: [name], female , DOB: 10/13/1950 , age 10 y.o. , MRN: [id number] , ADDRESS: [address] [place] [address] PCP Georganna Skeans, MD Patient Care Team: Georganna Skeans, MD as PCP - General (Family Medicine) Duke Salvia, MD as PCP - Electrophysiology (Cardiology) Corky Crafts, MD as PCP - Cardiology (Cardiology) Griselda Miner, MD as Consulting Physician (General Surgery) Malachy Mood, MD as Consulting Physician (Hematology) Lurline Hare, MD as Consulting Physician (Radiation Oncology) Donnelly Angelica, RN as Registered Nurse Pershing Proud, RN as Registered Nurse Duke Salvia, MD as Consulting Physician (Cardiology) Hubbard Hartshorn, NP (Inactive) as Nurse Practitioner (Nurse Practitioner) [name], NP as Nurse Practitioner (Nurse Practitioner) Coralyn Helling, MD as Consulting Physician (Pulmonary Disease) Griselda Miner, MD as Consulting Physician (General Surgery) Pollyann Savoy, MD as Consulting Physician (Rheumatology)  This Provider for this visit: Treatment Team:  Attending Provider: Kalman Shan, MD    08/23/2022 -   Chief Complaint  Patient presents with   Consult    Referred for ILD, states DOE     HPI [facility] 72 y.o. -referred by Dr. Craige Cotta to Dr. [name] the fibrosis center.  History is gained by review of the chart, talking to the patient in the ILD questionnaire that the patient fell.  There is no one accompanying the patient.  She has been following with Dr. Craige Cotta at least for the last 3 to 4 years for a diagnosis of bronchiectasis.  She says at baseline she is doing fairly well but then in October 2022 she suffered from COVID-19 and then was hospitalized.  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   Then a few weeks later on 06/03/2021 she was hospitalized for worsening respiratory failure which she believes was because of COVID-19 but review the records indicate that she actually had COVID in September 2022 and was treated with Paxlovid as an outpatient.  At this hospitalization the COVID was negative.  She was actually hypoxemic.  Pulmonary embolism was ruled out.  This admission RVP and COVID were normal.  Procalcitonin was normal.  Final diagnosis was acute on chronic hypoxemic respiratory failure due to bronchiectasis and pulmonary fibrosis.  She says since then she has been gradually weaned of oxygen.  She says overall she is better since the hospitalization October 2022 but she is not back to her baseline from before the hospitalization.  She has dyspnea on exertion.  Most recently November 2023 Dr. Craige Cotta did high-resolution CT scan of the chest that is concerning for honeycombing findings and progressive changes [personally visualized and I do not fully agree].  Therefore she has been referred here.  Pulmonary function testing shows still blood in DLCO but the FVC seems reduced recently.   All the question is as follows  Most recently she is use 2 L of oxygen with exertion.   [facility] Comprehensive ILD Questionnaire  Symptoms:  Other than dyspnea she also has a cough.  The cough is a since December 2023.  Although currently it is not there.   Past Medical History :  -She marked positive for COPD according to history but does not not be correct. -She is have OSA for which she is previous Dr. Craige Cotta - She does have a longstanding  a sit/stand test after doing it 10 times.  But I do not have a baseline to compare to.   TEST/EVENTS :  PFT 04/17/12 >> FEV1 2.60 (110%), FEV1% 85, TLC 4.05 (77%), DLCO 44% PFT 01/17/17 >> FEV1 2.38 (111%), FEV1% 90, TLC 3.85 (71%), DLCO 58%, no BD RAST 05/30/17 >> dust mites PFT 10/16/18 >> FEV1 2.27 (111%), FEV1% 90, TLC 4.00 (74%), DLCO 59% PFT 06/08/22 >> FEV1 2.20 (91%), FEV1% 93, TLC 3.76 (69%), DLCO 62%   Serology:  04/02/12 >> ACE 1 10/25/15 >> ESR 40, ACE 32 05/30/17 >> IgA, IgG, IgM normal 10/16/18 >> HP panel negative, ACE 25   Chest Imaging:  CT chest 04/10/12 >> b/l lower lobe cylindrical BTX and some in upper lobes CT angio chest 10/26/15 >> patchy GGO in periphery of lungs b/l, basilar BTX, no PE; no significant change compared to 2013 HRCT chest 10/08/18 >> atherosclerosis, basilar predominant cylindrical BTX, mild centrilobular/paraseptal emphysema, air trapping CT angio chest 03/24/19 >> Chronic lung changes with peribronchial thickening, bibasilar atelectasis and chronic interstitial disease/peripheral fibrosis at the lung bases CT angio chest 06/03/21 >> scattered GGO with fibrotic changes CT angio chest 08/30/21 >> BTX with emphysema and fibrotic changes in periphery HRCT 06/2022 -Progressive ILD-dx of UIP    Sleep Tests:  PSG 11/29/08 >> AHI 9 HST 11/10/15 >> AHI 7.1, SaO2 low 75% CPAP 06/17/21 to 07/16/21 >> used on 27 of 30 nights with average 5 hrs 24 min.  Average AHI 2.5 with CPAP 5 cm H2O.   Cardiac Tests:  Echo 08/11/21 >> EF 50 to 55%, RVSP 39.5 mmHg  PFT  OV  04/11/2023  Subjective:  Patient ID: [name], female , DOB: 07-31-1951 , age 12 y.o. , MRN: [id number] , ADDRESS: [address] [place] [address] PCP [name], NP Patient Care Team: [name], NP as PCP - General (Nurse Practitioner) Duke Salvia, MD as PCP - Electrophysiology (Cardiology) Corky Crafts, MD as PCP - Cardiology (Cardiology) Griselda Miner, MD as Consulting Physician (General Surgery) Malachy Mood, MD as Consulting Physician (Hematology) Lurline Hare, MD as Consulting Physician (Radiation Oncology) Donnelly Angelica, RN as Registered Nurse Pershing Proud, RN as Registered Nurse Duke Salvia, MD as Consulting Physician (Cardiology) Hubbard Hartshorn, NP (Inactive) as Nurse Practitioner (Nurse Practitioner) [name], NP as Nurse Practitioner (Nurse Practitioner) Coralyn Helling, MD as Consulting Physician (Pulmonary Disease) Griselda Miner, MD as Consulting Physician (General Surgery) Pollyann Savoy, MD as Consulting Physician (Rheumatology)  This Provider for this visit: Treatment Team:  Attending Provider: Kalman Shan, MD    04/11/2023 -   Chief Complaint  Patient presents with   Follow-up    F/up on hosp visit, IPF and blood work   HPI [employer] 72 y.o. -returns for follow-up.  She was seen acutely in July 2020 for outpatient COVID then in August 2024 she came in acutely what look like bronchitis ended up turning out to be pulmonary embolism.  She did get admitted.  She has been discharged on Pradaxa because Eliquis was expensive.  She is using oxygen 2 L nasal cannula she feels better but he does get tachycardic.  She is worried about the PE.  She is worried about the duration of Pradaxa and the cost associated with it and previous bleeding risk she says she had a bleeding episode 20 years ago.  I did indicate to her that potentially lifelong treatment because a second  a sit/stand test after doing it 10 times.  But I do not have a baseline to compare to.   TEST/EVENTS :  PFT 04/17/12 >> FEV1 2.60 (110%), FEV1% 85, TLC 4.05 (77%), DLCO 44% PFT 01/17/17 >> FEV1 2.38 (111%), FEV1% 90, TLC 3.85 (71%), DLCO 58%, no BD RAST 05/30/17 >> dust mites PFT 10/16/18 >> FEV1 2.27 (111%), FEV1% 90, TLC 4.00 (74%), DLCO 59% PFT 06/08/22 >> FEV1 2.20 (91%), FEV1% 93, TLC 3.76 (69%), DLCO 62%   Serology:  04/02/12 >> ACE 1 10/25/15 >> ESR 40, ACE 32 05/30/17 >> IgA, IgG, IgM normal 10/16/18 >> HP panel negative, ACE 25   Chest Imaging:  CT chest 04/10/12 >> b/l lower lobe cylindrical BTX and some in upper lobes CT angio chest 10/26/15 >> patchy GGO in periphery of lungs b/l, basilar BTX, no PE; no significant change compared to 2013 HRCT chest 10/08/18 >> atherosclerosis, basilar predominant cylindrical BTX, mild centrilobular/paraseptal emphysema, air trapping CT angio chest 03/24/19 >> Chronic lung changes with peribronchial thickening, bibasilar atelectasis and chronic interstitial disease/peripheral fibrosis at the lung bases CT angio chest 06/03/21 >> scattered GGO with fibrotic changes CT angio chest 08/30/21 >> BTX with emphysema and fibrotic changes in periphery HRCT 06/2022 -Progressive ILD-dx of UIP    Sleep Tests:  PSG 11/29/08 >> AHI 9 HST 11/10/15 >> AHI 7.1, SaO2 low 75% CPAP 06/17/21 to 07/16/21 >> used on 27 of 30 nights with average 5 hrs 24 min.  Average AHI 2.5 with CPAP 5 cm H2O.   Cardiac Tests:  Echo 08/11/21 >> EF 50 to 55%, RVSP 39.5 mmHg  PFT  OV  04/11/2023  Subjective:  Patient ID: [name], female , DOB: 07-31-1951 , age 12 y.o. , MRN: [id number] , ADDRESS: [address] [place] [address] PCP [name], NP Patient Care Team: [name], NP as PCP - General (Nurse Practitioner) Duke Salvia, MD as PCP - Electrophysiology (Cardiology) Corky Crafts, MD as PCP - Cardiology (Cardiology) Griselda Miner, MD as Consulting Physician (General Surgery) Malachy Mood, MD as Consulting Physician (Hematology) Lurline Hare, MD as Consulting Physician (Radiation Oncology) Donnelly Angelica, RN as Registered Nurse Pershing Proud, RN as Registered Nurse Duke Salvia, MD as Consulting Physician (Cardiology) Hubbard Hartshorn, NP (Inactive) as Nurse Practitioner (Nurse Practitioner) [name], NP as Nurse Practitioner (Nurse Practitioner) Coralyn Helling, MD as Consulting Physician (Pulmonary Disease) Griselda Miner, MD as Consulting Physician (General Surgery) Pollyann Savoy, MD as Consulting Physician (Rheumatology)  This Provider for this visit: Treatment Team:  Attending Provider: Kalman Shan, MD    04/11/2023 -   Chief Complaint  Patient presents with   Follow-up    F/up on hosp visit, IPF and blood work   HPI [employer] 72 y.o. -returns for follow-up.  She was seen acutely in July 2020 for outpatient COVID then in August 2024 she came in acutely what look like bronchitis ended up turning out to be pulmonary embolism.  She did get admitted.  She has been discharged on Pradaxa because Eliquis was expensive.  She is using oxygen 2 L nasal cannula she feels better but he does get tachycardic.  She is worried about the PE.  She is worried about the duration of Pradaxa and the cost associated with it and previous bleeding risk she says she had a bleeding episode 20 years ago.  I did indicate to her that potentially lifelong treatment because a second  OV 08/23/2022 -refer by Dr. Craige Cotta to Dr. [name] at the pulmonary fibrosis center.  Subjective:  Patient ID: [name], female , DOB: 10/13/1950 , age 10 y.o. , MRN: [id number] , ADDRESS: [address] [place] [address] PCP Georganna Skeans, MD Patient Care Team: Georganna Skeans, MD as PCP - General (Family Medicine) Duke Salvia, MD as PCP - Electrophysiology (Cardiology) Corky Crafts, MD as PCP - Cardiology (Cardiology) Griselda Miner, MD as Consulting Physician (General Surgery) Malachy Mood, MD as Consulting Physician (Hematology) Lurline Hare, MD as Consulting Physician (Radiation Oncology) Donnelly Angelica, RN as Registered Nurse Pershing Proud, RN as Registered Nurse Duke Salvia, MD as Consulting Physician (Cardiology) Hubbard Hartshorn, NP (Inactive) as Nurse Practitioner (Nurse Practitioner) [name], NP as Nurse Practitioner (Nurse Practitioner) Coralyn Helling, MD as Consulting Physician (Pulmonary Disease) Griselda Miner, MD as Consulting Physician (General Surgery) Pollyann Savoy, MD as Consulting Physician (Rheumatology)  This Provider for this visit: Treatment Team:  Attending Provider: Kalman Shan, MD    08/23/2022 -   Chief Complaint  Patient presents with   Consult    Referred for ILD, states DOE     HPI [facility] 72 y.o. -referred by Dr. Craige Cotta to Dr. [name] the fibrosis center.  History is gained by review of the chart, talking to the patient in the ILD questionnaire that the patient fell.  There is no one accompanying the patient.  She has been following with Dr. Craige Cotta at least for the last 3 to 4 years for a diagnosis of bronchiectasis.  She says at baseline she is doing fairly well but then in October 2022 she suffered from COVID-19 and then was hospitalized.  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   Then a few weeks later on 06/03/2021 she was hospitalized for worsening respiratory failure which she believes was because of COVID-19 but review the records indicate that she actually had COVID in September 2022 and was treated with Paxlovid as an outpatient.  At this hospitalization the COVID was negative.  She was actually hypoxemic.  Pulmonary embolism was ruled out.  This admission RVP and COVID were normal.  Procalcitonin was normal.  Final diagnosis was acute on chronic hypoxemic respiratory failure due to bronchiectasis and pulmonary fibrosis.  She says since then she has been gradually weaned of oxygen.  She says overall she is better since the hospitalization October 2022 but she is not back to her baseline from before the hospitalization.  She has dyspnea on exertion.  Most recently November 2023 Dr. Craige Cotta did high-resolution CT scan of the chest that is concerning for honeycombing findings and progressive changes [personally visualized and I do not fully agree].  Therefore she has been referred here.  Pulmonary function testing shows still blood in DLCO but the FVC seems reduced recently.   All the question is as follows  Most recently she is use 2 L of oxygen with exertion.   [facility] Comprehensive ILD Questionnaire  Symptoms:  Other than dyspnea she also has a cough.  The cough is a since December 2023.  Although currently it is not there.   Past Medical History :  -She marked positive for COPD according to history but does not not be correct. -She is have OSA for which she is previous Dr. Craige Cotta - She does have a longstanding  episode versus at  least taking for a year and then reassessing.  In terms of IPF LFTs most recently mid August 2024 are normal.  She is agreed to start pirfenidone.  We did a sit/stand hypoxemia test and she did desaturate again.  I did indicate to her this might not be the PE make her desaturate but the IPF.  She asked about transplant I did indicate to her the 72 is at the upper limit plus she has got obesity.  Did indicate to her that we need to make progress with weight loss and then subsequently we went with the basics of IPF management and then we can decide.  She is using nighttime oxygen on her own.  But is never been tested.  Sleep study was ordered before she saw Dr. Val Eagle but because of illness this was canceled.  We will reorder this.  OV 05/23/2023  Subjective:  Patient ID: [name], female , DOB: [birth year] , age 47 y.o. , MRN: [id number] , ADDRESS: [address] [place] [address] PCP [name], NP Patient Care Team: [name], NP as PCP - General (Nurse Practitioner) Duke Salvia, MD as PCP - Electrophysiology (Cardiology) Corky Crafts, MD as PCP - Cardiology (Cardiology) Griselda Miner, MD as Consulting Physician (General Surgery) Malachy Mood, MD as Consulting Physician (Hematology) Lurline Hare, MD as Consulting Physician (Radiation Oncology) Donnelly Angelica, RN as Registered Nurse Pershing Proud, RN as Registered Nurse Duke Salvia, MD as Consulting Physician (Cardiology) Hubbard Hartshorn, NP (Inactive) as Nurse Practitioner (Nurse Practitioner) [name], NP as Nurse Practitioner (Nurse Practitioner) Coralyn Helling, MD (Inactive) as Consulting Physician (Pulmonary Disease) Griselda Miner, MD as Consulting Physician (General Surgery) Pollyann Savoy, MD as Consulting Physician (Rheumatology)  This Provider for this visit: Treatment Team:  Attending Provider: Kalman Shan, MD    05/23/2023 -   Chief Complaint  Patient  presents with   Followup     She has occ dizzy spells- occurs while at rest. She feels like her breathing has improved since the last visit. She notices "rattling in chest at night". Her cough is mainly non prod.     #IPF patient unable to start pirfenidone because of previous elevations in liver enzymes deemed as fatty liver by GI  #Outpatient COVID-19 in July 2024 followed by pulmonary embolism mid August 2024.  [Previous history of VTE]  #Sleep apnea in the process of transfer from Dr. Craige Cotta to Dr. Val Eagle  #Esbriet/Pirfenidone requires intensive drug monitoring due to high concerns for Adverse effects of , including  Drug Induced Liver Injury, significant GI side effects that include but not limited to Diarrhea, Nausea, Vomiting,  and other system side effects that include Fatigue, headaches, weight loss and other side effects such as skin rash. These will be monitored with  blood work such as LFT initially once a month for 6 months and then quarterly On  HPI Laketa Sandoz 72 y.o. 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  She finally got a pirfenidone mid September 2024 based on chart review.  Today she goes up to 3 pills 3 times daily.  She initially said she was tolerating pirfenidone well but then she admitted that ever since she started the pirfenidone she is getting dizzy spells.  These are very transient split-second.  They happen spontaneously.  2-3 times a day.  Could be standing or sitting or lying down.  She  the case conference.  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  Went over the 2 antifibrotic's nintedanib and pirfenidone.  Shows pirfenidone because of the diarrhea side effects with nintedanib.  She has knee pain and be hard to get to the toilet.  In addition she has nonischemic cardiomyopathy and this blackbox warning for MI risk with with nintedanib.  [Even though she does not have ischemic cardiomyopathy].  Told her that pirfenidone associated more with weight loss nausea anorexia.  Both drugs required liver function monitoring.  Generally both drugs have reversible side effects.  In balance which was pirfenidone.  I  did note mild transaminitis in the past will check liver function test again today.   [id number]      OV 01/03/2023  Subjective:  Patient ID: [name], female , DOB: Dec 03, 1950 , age 80 y.o. , MRN: [id number] , ADDRESS: [address] [place] [address] PCP Georganna Skeans, MD Patient Care Team: Georganna Skeans, MD as PCP - General (Family Medicine) Duke Salvia, MD as PCP - Electrophysiology (Cardiology) Corky Crafts, MD as PCP - Cardiology (Cardiology) Griselda Miner, MD as Consulting Physician (General Surgery) Malachy Mood, MD as Consulting Physician (Hematology) Lurline Hare, MD as Consulting Physician (Radiation Oncology) Donnelly Angelica, RN as Registered Nurse Pershing Proud, RN as Registered Nurse Duke Salvia, MD as Consulting Physician (Cardiology) Hubbard Hartshorn, NP (Inactive) as Nurse Practitioner (Nurse Practitioner) [name], NP as Nurse Practitioner (Nurse Practitioner) Coralyn Helling, MD as Consulting Physician (Pulmonary Disease) Griselda Miner, MD as Consulting Physician (General Surgery) Pollyann Savoy, MD as Consulting Physician (Rheumatology)  This Provider for this visit: Treatment Team:  Attending Provider: Kalman Shan, MD    01/03/2023 -   Chief Complaint  Patient presents with   Follow-up    F/up wheezing, congestion, cough, sob with exertion.     HPI Sayuri Rhames 72 y.o. -returns for follow-up.  At this visit she was supposed to have started pirfenidone but what happened is that she had abnormal liver enzymes at the time of last visit.  We found that she was taking a lot of Tylenol for the knee pain.  She is now saying she did not take a lot but she actually did.  Her liver enzymes have since normalized as of December 07, 2022.  She is concerned about a high total protein.  She wanted to discuss this.  I did recommend to her that she discuss this with  primary care physician.  Then she brought her back pain.  I also suggested she discuss with primary care physician.  She wants repeat liver enzymes check especially because of total protein which she believes is because of the Repatha.  Meanwhile she continues to be symptomatic.  We discussed in the case conference in the results are below.  I did share these results with the patient.     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 NOv 2023: most rcent versus Feb 2020 HRCT. In msost recent Ct  basilar predmon, ILD, Subpleural reticuation with prominent TB. There is clear honeycombing (moderate) in lung base. No air trapping. Progressed sinc2020.  C/w UIP. In 2020: read as alternate diagnosis but Dr Ashley Murrain thinks in retrospect is mild to moderate  probable or c/w UIP with mild honeycombing. The bronchiectasis all traction bronchiectasis of  the case conference.  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  Went over the 2 antifibrotic's nintedanib and pirfenidone.  Shows pirfenidone because of the diarrhea side effects with nintedanib.  She has knee pain and be hard to get to the toilet.  In addition she has nonischemic cardiomyopathy and this blackbox warning for MI risk with with nintedanib.  [Even though she does not have ischemic cardiomyopathy].  Told her that pirfenidone associated more with weight loss nausea anorexia.  Both drugs required liver function monitoring.  Generally both drugs have reversible side effects.  In balance which was pirfenidone.  I  did note mild transaminitis in the past will check liver function test again today.   Xxxxxxxxxxxxx[id number]      OV 01/03/2023  Subjective:  Patient ID: [name], female , DOB: Dec 03, 1950 , age 80 y.o. , MRN: [id number] , ADDRESS: [address] [place] [address] PCP Georganna Skeans, MD Patient Care Team: Georganna Skeans, MD as PCP - General (Family Medicine) Duke Salvia, MD as PCP - Electrophysiology (Cardiology) Corky Crafts, MD as PCP - Cardiology (Cardiology) Griselda Miner, MD as Consulting Physician (General Surgery) Malachy Mood, MD as Consulting Physician (Hematology) Lurline Hare, MD as Consulting Physician (Radiation Oncology) Donnelly Angelica, RN as Registered Nurse Pershing Proud, RN as Registered Nurse Duke Salvia, MD as Consulting Physician (Cardiology) Hubbard Hartshorn, NP (Inactive) as Nurse Practitioner (Nurse Practitioner) [name], NP as Nurse Practitioner (Nurse Practitioner) Coralyn Helling, MD as Consulting Physician (Pulmonary Disease) Griselda Miner, MD as Consulting Physician (General Surgery) Pollyann Savoy, MD as Consulting Physician (Rheumatology)  This Provider for this visit: Treatment Team:  Attending Provider: Kalman Shan, MD    01/03/2023 -   Chief Complaint  Patient presents with   Follow-up    F/up wheezing, congestion, cough, sob with exertion.     HPI Sayuri Rhames 72 y.o. -returns for follow-up.  At this visit she was supposed to have started pirfenidone but what happened is that she had abnormal liver enzymes at the time of last visit.  We found that she was taking a lot of Tylenol for the knee pain.  She is now saying she did not take a lot but she actually did.  Her liver enzymes have since normalized as of December 07, 2022.  She is concerned about a high total protein.  She wanted to discuss this.  I did recommend to her that she discuss this with  primary care physician.  Then she brought her back pain.  I also suggested she discuss with primary care physician.  She wants repeat liver enzymes check especially because of total protein which she believes is because of the Repatha.  Meanwhile she continues to be symptomatic.  We discussed in the case conference in the results are below.  I did share these results with the patient.     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 NOv 2023: most rcent versus Feb 2020 HRCT. In msost recent Ct  basilar predmon, ILD, Subpleural reticuation with prominent TB. There is clear honeycombing (moderate) in lung base. No air trapping. Progressed sinc2020.  C/w UIP. In 2020: read as alternate diagnosis but Dr Ashley Murrain thinks in retrospect is mild to moderate  probable or c/w UIP with mild honeycombing. The bronchiectasis all traction bronchiectasis of  There are no aggressive appearing lytic or blastic lesions noted in the visualized portions of the skeleton.   IMPRESSION: 1. 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 2. Aortic atherosclerosis, in addition to left anterior  descending coronary artery disease. Assessment for potential risk factor modification, dietary therapy or pharmacologic therapy may be warranted, if clinically indicated. 3. Colonic diverticulosis without evidence of acute diverticulitis at this time.   Aortic Atherosclerosis (ICD10-I70.0).     Electronically Signed   By: Trudie Reed M.D.   On: 07/08/2022 10:54     OV 11/22/2022  Subjective:  Patient ID: [name], female , DOB: 1951-02-04 , age 62 y.o. , MRN: [id number] , ADDRESS: [address] [place] [address] PCP Georganna Skeans, MD Patient Care Team: Georganna Skeans, MD as PCP - General (Family Medicine) Duke Salvia, MD as PCP - Electrophysiology (Cardiology) Corky Crafts, MD as PCP - Cardiology (Cardiology) Griselda Miner, MD as Consulting Physician (General Surgery) Malachy Mood, MD as Consulting Physician (Hematology) Lurline Hare, MD as Consulting Physician (Radiation Oncology) Donnelly Angelica, RN as Registered Nurse Pershing Proud, RN as Registered Nurse Duke Salvia, MD as Consulting Physician (Cardiology) Hubbard Hartshorn, NP (Inactive) as Nurse Practitioner (Nurse Practitioner) [name], NP as Nurse Practitioner (Nurse Practitioner) Coralyn Helling, MD as Consulting Physician (Pulmonary Disease) Griselda Miner, MD as Consulting Physician (General Surgery) Pollyann Savoy, MD as Consulting Physician (Rheumatology)  This Provider for this visit: Treatment Team:  Attending Provider: Kalman Shan, MD    11/22/2022 -   Chief Complaint  Patient presents with   Follow-up    F/u pft 11/15/22. Labs,      HPI Tayonna Bacha 72 y.o. -returns for follow-up.  This is potential ILD workup in progress.  She states in the interim her son who is age 88 did have a heart transplant for congestive heart failure 3 weeks ago at [facility] he is home and is improving.  She is happy about that.  She also  reports that after her COVID she was prescribed Symbicort she is run out of the Symbicort for the last 2 weeks but this is not really bothering her.  She feels stable she has occasional cough.  Be here to review the results.  She had pulmonary function test that shows now a consistent pattern of decline her FVC is declined 9% in the last 4 years.  I went back and visualized all her CT scans of the chest.  Her CT scan in 2013 and a high-resolution CT chest in 2020 look about similar with some bronchiectasis.  However there is dramatic change between 2020 and also the high-resolution CT chest and late 2023.  This seems a lot more traction bronchiectasis.  There is definite craniocaudal gradient.  She has crackles here.  Initially I was not sure if this was classic UIP but clinically this makes sense especially of the lower lobe crackles.  Was supposed to discuss this in the case conference but this did not happen.  Every posted for discussion of the case conference but nevertheless his decline in PFTs and also this change now in the CT scan of the chest.  In the interim his serologies are normal.  I think at the least she has some plan for progressive ILD .  Possible is IPF but I would like to confirm this and discussion of  There are no aggressive appearing lytic or blastic lesions noted in the visualized portions of the skeleton.   IMPRESSION: 1. 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 2. Aortic atherosclerosis, in addition to left anterior  descending coronary artery disease. Assessment for potential risk factor modification, dietary therapy or pharmacologic therapy may be warranted, if clinically indicated. 3. Colonic diverticulosis without evidence of acute diverticulitis at this time.   Aortic Atherosclerosis (ICD10-I70.0).     Electronically Signed   By: Trudie Reed M.D.   On: 07/08/2022 10:54     OV 11/22/2022  Subjective:  Patient ID: [name], female , DOB: 1951-02-04 , age 62 y.o. , MRN: [id number] , ADDRESS: [address] [place] [address] PCP Georganna Skeans, MD Patient Care Team: Georganna Skeans, MD as PCP - General (Family Medicine) Duke Salvia, MD as PCP - Electrophysiology (Cardiology) Corky Crafts, MD as PCP - Cardiology (Cardiology) Griselda Miner, MD as Consulting Physician (General Surgery) Malachy Mood, MD as Consulting Physician (Hematology) Lurline Hare, MD as Consulting Physician (Radiation Oncology) Donnelly Angelica, RN as Registered Nurse Pershing Proud, RN as Registered Nurse Duke Salvia, MD as Consulting Physician (Cardiology) Hubbard Hartshorn, NP (Inactive) as Nurse Practitioner (Nurse Practitioner) [name], NP as Nurse Practitioner (Nurse Practitioner) Coralyn Helling, MD as Consulting Physician (Pulmonary Disease) Griselda Miner, MD as Consulting Physician (General Surgery) Pollyann Savoy, MD as Consulting Physician (Rheumatology)  This Provider for this visit: Treatment Team:  Attending Provider: Kalman Shan, MD    11/22/2022 -   Chief Complaint  Patient presents with   Follow-up    F/u pft 11/15/22. Labs,      HPI Tayonna Bacha 72 y.o. -returns for follow-up.  This is potential ILD workup in progress.  She states in the interim her son who is age 88 did have a heart transplant for congestive heart failure 3 weeks ago at [facility] he is home and is improving.  She is happy about that.  She also  reports that after her COVID she was prescribed Symbicort she is run out of the Symbicort for the last 2 weeks but this is not really bothering her.  She feels stable she has occasional cough.  Be here to review the results.  She had pulmonary function test that shows now a consistent pattern of decline her FVC is declined 9% in the last 4 years.  I went back and visualized all her CT scans of the chest.  Her CT scan in 2013 and a high-resolution CT chest in 2020 look about similar with some bronchiectasis.  However there is dramatic change between 2020 and also the high-resolution CT chest and late 2023.  This seems a lot more traction bronchiectasis.  There is definite craniocaudal gradient.  She has crackles here.  Initially I was not sure if this was classic UIP but clinically this makes sense especially of the lower lobe crackles.  Was supposed to discuss this in the case conference but this did not happen.  Every posted for discussion of the case conference but nevertheless his decline in PFTs and also this change now in the CT scan of the chest.  In the interim his serologies are normal.  I think at the least she has some plan for progressive ILD .  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 Craige Cotta to Dr. [name] at the pulmonary fibrosis center.  Subjective:  Patient ID: [name], female , DOB: 10/13/1950 , age 10 y.o. , MRN: [id number] , ADDRESS: [address] [place] [address] PCP Georganna Skeans, MD Patient Care Team: Georganna Skeans, MD as PCP - General (Family Medicine) Duke Salvia, MD as PCP - Electrophysiology (Cardiology) Corky Crafts, MD as PCP - Cardiology (Cardiology) Griselda Miner, MD as Consulting Physician (General Surgery) Malachy Mood, MD as Consulting Physician (Hematology) Lurline Hare, MD as Consulting Physician (Radiation Oncology) Donnelly Angelica, RN as Registered Nurse Pershing Proud, RN as Registered Nurse Duke Salvia, MD as Consulting Physician (Cardiology) Hubbard Hartshorn, NP (Inactive) as Nurse Practitioner (Nurse Practitioner) [name], NP as Nurse Practitioner (Nurse Practitioner) Coralyn Helling, MD as Consulting Physician (Pulmonary Disease) Griselda Miner, MD as Consulting Physician (General Surgery) Pollyann Savoy, MD as Consulting Physician (Rheumatology)  This Provider for this visit: Treatment Team:  Attending Provider: Kalman Shan, MD    08/23/2022 -   Chief Complaint  Patient presents with   Consult    Referred for ILD, states DOE     HPI [facility] 72 y.o. -referred by Dr. Craige Cotta to Dr. [name] the fibrosis center.  History is gained by review of the chart, talking to the patient in the ILD questionnaire that the patient fell.  There is no one accompanying the patient.  She has been following with Dr. Craige Cotta at least for the last 3 to 4 years for a diagnosis of bronchiectasis.  She says at baseline she is doing fairly well but then in October 2022 she suffered from COVID-19 and then was hospitalized.  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   Then a few weeks later on 06/03/2021 she was hospitalized for worsening respiratory failure which she believes was because of COVID-19 but review the records indicate that she actually had COVID in September 2022 and was treated with Paxlovid as an outpatient.  At this hospitalization the COVID was negative.  She was actually hypoxemic.  Pulmonary embolism was ruled out.  This admission RVP and COVID were normal.  Procalcitonin was normal.  Final diagnosis was acute on chronic hypoxemic respiratory failure due to bronchiectasis and pulmonary fibrosis.  She says since then she has been gradually weaned of oxygen.  She says overall she is better since the hospitalization October 2022 but she is not back to her baseline from before the hospitalization.  She has dyspnea on exertion.  Most recently November 2023 Dr. Craige Cotta did high-resolution CT scan of the chest that is concerning for honeycombing findings and progressive changes [personally visualized and I do not fully agree].  Therefore she has been referred here.  Pulmonary function testing shows still blood in DLCO but the FVC seems reduced recently.   All the question is as follows  Most recently she is use 2 L of oxygen with exertion.   [facility] Comprehensive ILD Questionnaire  Symptoms:  Other than dyspnea she also has a cough.  The cough is a since December 2023.  Although currently it is not there.   Past Medical History :  -She marked positive for COPD according to history but does not not be correct. -She is have OSA for which she is previous Dr. Craige Cotta - She does have a longstanding  a sit/stand test after doing it 10 times.  But I do not have a baseline to compare to.   TEST/EVENTS :  PFT 04/17/12 >> FEV1 2.60 (110%), FEV1% 85, TLC 4.05 (77%), DLCO 44% PFT 01/17/17 >> FEV1 2.38 (111%), FEV1% 90, TLC 3.85 (71%), DLCO 58%, no BD RAST 05/30/17 >> dust mites PFT 10/16/18 >> FEV1 2.27 (111%), FEV1% 90, TLC 4.00 (74%), DLCO 59% PFT 06/08/22 >> FEV1 2.20 (91%), FEV1% 93, TLC 3.76 (69%), DLCO 62%   Serology:  04/02/12 >> ACE 1 10/25/15 >> ESR 40, ACE 32 05/30/17 >> IgA, IgG, IgM normal 10/16/18 >> HP panel negative, ACE 25   Chest Imaging:  CT chest 04/10/12 >> b/l lower lobe cylindrical BTX and some in upper lobes CT angio chest 10/26/15 >> patchy GGO in periphery of lungs b/l, basilar BTX, no PE; no significant change compared to 2013 HRCT chest 10/08/18 >> atherosclerosis, basilar predominant cylindrical BTX, mild centrilobular/paraseptal emphysema, air trapping CT angio chest 03/24/19 >> Chronic lung changes with peribronchial thickening, bibasilar atelectasis and chronic interstitial disease/peripheral fibrosis at the lung bases CT angio chest 06/03/21 >> scattered GGO with fibrotic changes CT angio chest 08/30/21 >> BTX with emphysema and fibrotic changes in periphery HRCT 06/2022 -Progressive ILD-dx of UIP    Sleep Tests:  PSG 11/29/08 >> AHI 9 HST 11/10/15 >> AHI 7.1, SaO2 low 75% CPAP 06/17/21 to 07/16/21 >> used on 27 of 30 nights with average 5 hrs 24 min.  Average AHI 2.5 with CPAP 5 cm H2O.   Cardiac Tests:  Echo 08/11/21 >> EF 50 to 55%, RVSP 39.5 mmHg  PFT  OV  04/11/2023  Subjective:  Patient ID: [name], female , DOB: 07-31-1951 , age 12 y.o. , MRN: [id number] , ADDRESS: [address] [place] [address] PCP [name], NP Patient Care Team: [name], NP as PCP - General (Nurse Practitioner) Duke Salvia, MD as PCP - Electrophysiology (Cardiology) Corky Crafts, MD as PCP - Cardiology (Cardiology) Griselda Miner, MD as Consulting Physician (General Surgery) Malachy Mood, MD as Consulting Physician (Hematology) Lurline Hare, MD as Consulting Physician (Radiation Oncology) Donnelly Angelica, RN as Registered Nurse Pershing Proud, RN as Registered Nurse Duke Salvia, MD as Consulting Physician (Cardiology) Hubbard Hartshorn, NP (Inactive) as Nurse Practitioner (Nurse Practitioner) [name], NP as Nurse Practitioner (Nurse Practitioner) Coralyn Helling, MD as Consulting Physician (Pulmonary Disease) Griselda Miner, MD as Consulting Physician (General Surgery) Pollyann Savoy, MD as Consulting Physician (Rheumatology)  This Provider for this visit: Treatment Team:  Attending Provider: Kalman Shan, MD    04/11/2023 -   Chief Complaint  Patient presents with   Follow-up    F/up on hosp visit, IPF and blood work   HPI [employer] 72 y.o. -returns for follow-up.  She was seen acutely in July 2020 for outpatient COVID then in August 2024 she came in acutely what look like bronchitis ended up turning out to be pulmonary embolism.  She did get admitted.  She has been discharged on Pradaxa because Eliquis was expensive.  She is using oxygen 2 L nasal cannula she feels better but he does get tachycardic.  She is worried about the PE.  She is worried about the duration of Pradaxa and the cost associated with it and previous bleeding risk she says she had a bleeding episode 20 years ago.  I did indicate to her that potentially lifelong treatment because a second  episode versus at  least taking for a year and then reassessing.  In terms of IPF LFTs most recently mid August 2024 are normal.  She is agreed to start pirfenidone.  We did a sit/stand hypoxemia test and she did desaturate again.  I did indicate to her this might not be the PE make her desaturate but the IPF.  She asked about transplant I did indicate to her the 72 is at the upper limit plus she has got obesity.  Did indicate to her that we need to make progress with weight loss and then subsequently we went with the basics of IPF management and then we can decide.  She is using nighttime oxygen on her own.  But is never been tested.  Sleep study was ordered before she saw Dr. Val Eagle but because of illness this was canceled.  We will reorder this.  OV 05/23/2023  Subjective:  Patient ID: [name], female , DOB: [birth year] , age 47 y.o. , MRN: [id number] , ADDRESS: [address] [place] [address] PCP [name], NP Patient Care Team: [name], NP as PCP - General (Nurse Practitioner) Duke Salvia, MD as PCP - Electrophysiology (Cardiology) Corky Crafts, MD as PCP - Cardiology (Cardiology) Griselda Miner, MD as Consulting Physician (General Surgery) Malachy Mood, MD as Consulting Physician (Hematology) Lurline Hare, MD as Consulting Physician (Radiation Oncology) Donnelly Angelica, RN as Registered Nurse Pershing Proud, RN as Registered Nurse Duke Salvia, MD as Consulting Physician (Cardiology) Hubbard Hartshorn, NP (Inactive) as Nurse Practitioner (Nurse Practitioner) [name], NP as Nurse Practitioner (Nurse Practitioner) Coralyn Helling, MD (Inactive) as Consulting Physician (Pulmonary Disease) Griselda Miner, MD as Consulting Physician (General Surgery) Pollyann Savoy, MD as Consulting Physician (Rheumatology)  This Provider for this visit: Treatment Team:  Attending Provider: Kalman Shan, MD    05/23/2023 -   Chief Complaint  Patient  presents with   Followup     She has occ dizzy spells- occurs while at rest. She feels like her breathing has improved since the last visit. She notices "rattling in chest at night". Her cough is mainly non prod.     #IPF patient unable to start pirfenidone because of previous elevations in liver enzymes deemed as fatty liver by GI  #Outpatient COVID-19 in July 2024 followed by pulmonary embolism mid August 2024.  [Previous history of VTE]  #Sleep apnea in the process of transfer from Dr. Craige Cotta to Dr. Val Eagle  #Esbriet/Pirfenidone requires intensive drug monitoring due to high concerns for Adverse effects of , including  Drug Induced Liver Injury, significant GI side effects that include but not limited to Diarrhea, Nausea, Vomiting,  and other system side effects that include Fatigue, headaches, weight loss and other side effects such as skin rash. These will be monitored with  blood work such as LFT initially once a month for 6 months and then quarterly On  HPI Laketa Sandoz 72 y.o. 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  She finally got a pirfenidone mid September 2024 based on chart review.  Today she goes up to 3 pills 3 times daily.  She initially said she was tolerating pirfenidone well but then she admitted that ever since she started the pirfenidone she is getting dizzy spells.  These are very transient split-second.  They happen spontaneously.  2-3 times a day.  Could be standing or sitting or lying down.  She  There are no aggressive appearing lytic or blastic lesions noted in the visualized portions of the skeleton.   IMPRESSION: 1. 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 2. Aortic atherosclerosis, in addition to left anterior  descending coronary artery disease. Assessment for potential risk factor modification, dietary therapy or pharmacologic therapy may be warranted, if clinically indicated. 3. Colonic diverticulosis without evidence of acute diverticulitis at this time.   Aortic Atherosclerosis (ICD10-I70.0).     Electronically Signed   By: Trudie Reed M.D.   On: 07/08/2022 10:54     OV 11/22/2022  Subjective:  Patient ID: [name], female , DOB: 1951-02-04 , age 62 y.o. , MRN: [id number] , ADDRESS: [address] [place] [address] PCP Georganna Skeans, MD Patient Care Team: Georganna Skeans, MD as PCP - General (Family Medicine) Duke Salvia, MD as PCP - Electrophysiology (Cardiology) Corky Crafts, MD as PCP - Cardiology (Cardiology) Griselda Miner, MD as Consulting Physician (General Surgery) Malachy Mood, MD as Consulting Physician (Hematology) Lurline Hare, MD as Consulting Physician (Radiation Oncology) Donnelly Angelica, RN as Registered Nurse Pershing Proud, RN as Registered Nurse Duke Salvia, MD as Consulting Physician (Cardiology) Hubbard Hartshorn, NP (Inactive) as Nurse Practitioner (Nurse Practitioner) [name], NP as Nurse Practitioner (Nurse Practitioner) Coralyn Helling, MD as Consulting Physician (Pulmonary Disease) Griselda Miner, MD as Consulting Physician (General Surgery) Pollyann Savoy, MD as Consulting Physician (Rheumatology)  This Provider for this visit: Treatment Team:  Attending Provider: Kalman Shan, MD    11/22/2022 -   Chief Complaint  Patient presents with   Follow-up    F/u pft 11/15/22. Labs,      HPI Tayonna Bacha 72 y.o. -returns for follow-up.  This is potential ILD workup in progress.  She states in the interim her son who is age 88 did have a heart transplant for congestive heart failure 3 weeks ago at [facility] he is home and is improving.  She is happy about that.  She also  reports that after her COVID she was prescribed Symbicort she is run out of the Symbicort for the last 2 weeks but this is not really bothering her.  She feels stable she has occasional cough.  Be here to review the results.  She had pulmonary function test that shows now a consistent pattern of decline her FVC is declined 9% in the last 4 years.  I went back and visualized all her CT scans of the chest.  Her CT scan in 2013 and a high-resolution CT chest in 2020 look about similar with some bronchiectasis.  However there is dramatic change between 2020 and also the high-resolution CT chest and late 2023.  This seems a lot more traction bronchiectasis.  There is definite craniocaudal gradient.  She has crackles here.  Initially I was not sure if this was classic UIP but clinically this makes sense especially of the lower lobe crackles.  Was supposed to discuss this in the case conference but this did not happen.  Every posted for discussion of the case conference but nevertheless his decline in PFTs and also this change now in the CT scan of the chest.  In the interim his serologies are normal.  I think at the least she has some plan for progressive ILD .  Possible is IPF but I would like to confirm this and discussion of  There are no aggressive appearing lytic or blastic lesions noted in the visualized portions of the skeleton.   IMPRESSION: 1. 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 2. Aortic atherosclerosis, in addition to left anterior  descending coronary artery disease. Assessment for potential risk factor modification, dietary therapy or pharmacologic therapy may be warranted, if clinically indicated. 3. Colonic diverticulosis without evidence of acute diverticulitis at this time.   Aortic Atherosclerosis (ICD10-I70.0).     Electronically Signed   By: Trudie Reed M.D.   On: 07/08/2022 10:54     OV 11/22/2022  Subjective:  Patient ID: [name], female , DOB: 1951-02-04 , age 62 y.o. , MRN: [id number] , ADDRESS: [address] [place] [address] PCP Georganna Skeans, MD Patient Care Team: Georganna Skeans, MD as PCP - General (Family Medicine) Duke Salvia, MD as PCP - Electrophysiology (Cardiology) Corky Crafts, MD as PCP - Cardiology (Cardiology) Griselda Miner, MD as Consulting Physician (General Surgery) Malachy Mood, MD as Consulting Physician (Hematology) Lurline Hare, MD as Consulting Physician (Radiation Oncology) Donnelly Angelica, RN as Registered Nurse Pershing Proud, RN as Registered Nurse Duke Salvia, MD as Consulting Physician (Cardiology) Hubbard Hartshorn, NP (Inactive) as Nurse Practitioner (Nurse Practitioner) [name], NP as Nurse Practitioner (Nurse Practitioner) Coralyn Helling, MD as Consulting Physician (Pulmonary Disease) Griselda Miner, MD as Consulting Physician (General Surgery) Pollyann Savoy, MD as Consulting Physician (Rheumatology)  This Provider for this visit: Treatment Team:  Attending Provider: Kalman Shan, MD    11/22/2022 -   Chief Complaint  Patient presents with   Follow-up    F/u pft 11/15/22. Labs,      HPI Tayonna Bacha 72 y.o. -returns for follow-up.  This is potential ILD workup in progress.  She states in the interim her son who is age 88 did have a heart transplant for congestive heart failure 3 weeks ago at [facility] he is home and is improving.  She is happy about that.  She also  reports that after her COVID she was prescribed Symbicort she is run out of the Symbicort for the last 2 weeks but this is not really bothering her.  She feels stable she has occasional cough.  Be here to review the results.  She had pulmonary function test that shows now a consistent pattern of decline her FVC is declined 9% in the last 4 years.  I went back and visualized all her CT scans of the chest.  Her CT scan in 2013 and a high-resolution CT chest in 2020 look about similar with some bronchiectasis.  However there is dramatic change between 2020 and also the high-resolution CT chest and late 2023.  This seems a lot more traction bronchiectasis.  There is definite craniocaudal gradient.  She has crackles here.  Initially I was not sure if this was classic UIP but clinically this makes sense especially of the lower lobe crackles.  Was supposed to discuss this in the case conference but this did not happen.  Every posted for discussion of the case conference but nevertheless his decline in PFTs and also this change now in the CT scan of the chest.  In the interim his serologies are normal.  I think at the least she has some plan for progressive ILD .  Possible is IPF but I would like to confirm this and discussion of  There are no aggressive appearing lytic or blastic lesions noted in the visualized portions of the skeleton.   IMPRESSION: 1. 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 2. Aortic atherosclerosis, in addition to left anterior  descending coronary artery disease. Assessment for potential risk factor modification, dietary therapy or pharmacologic therapy may be warranted, if clinically indicated. 3. Colonic diverticulosis without evidence of acute diverticulitis at this time.   Aortic Atherosclerosis (ICD10-I70.0).     Electronically Signed   By: Trudie Reed M.D.   On: 07/08/2022 10:54     OV 11/22/2022  Subjective:  Patient ID: [name], female , DOB: 1951-02-04 , age 62 y.o. , MRN: [id number] , ADDRESS: [address] [place] [address] PCP Georganna Skeans, MD Patient Care Team: Georganna Skeans, MD as PCP - General (Family Medicine) Duke Salvia, MD as PCP - Electrophysiology (Cardiology) Corky Crafts, MD as PCP - Cardiology (Cardiology) Griselda Miner, MD as Consulting Physician (General Surgery) Malachy Mood, MD as Consulting Physician (Hematology) Lurline Hare, MD as Consulting Physician (Radiation Oncology) Donnelly Angelica, RN as Registered Nurse Pershing Proud, RN as Registered Nurse Duke Salvia, MD as Consulting Physician (Cardiology) Hubbard Hartshorn, NP (Inactive) as Nurse Practitioner (Nurse Practitioner) [name], NP as Nurse Practitioner (Nurse Practitioner) Coralyn Helling, MD as Consulting Physician (Pulmonary Disease) Griselda Miner, MD as Consulting Physician (General Surgery) Pollyann Savoy, MD as Consulting Physician (Rheumatology)  This Provider for this visit: Treatment Team:  Attending Provider: Kalman Shan, MD    11/22/2022 -   Chief Complaint  Patient presents with   Follow-up    F/u pft 11/15/22. Labs,      HPI Tayonna Bacha 72 y.o. -returns for follow-up.  This is potential ILD workup in progress.  She states in the interim her son who is age 88 did have a heart transplant for congestive heart failure 3 weeks ago at [facility] he is home and is improving.  She is happy about that.  She also  reports that after her COVID she was prescribed Symbicort she is run out of the Symbicort for the last 2 weeks but this is not really bothering her.  She feels stable she has occasional cough.  Be here to review the results.  She had pulmonary function test that shows now a consistent pattern of decline her FVC is declined 9% in the last 4 years.  I went back and visualized all her CT scans of the chest.  Her CT scan in 2013 and a high-resolution CT chest in 2020 look about similar with some bronchiectasis.  However there is dramatic change between 2020 and also the high-resolution CT chest and late 2023.  This seems a lot more traction bronchiectasis.  There is definite craniocaudal gradient.  She has crackles here.  Initially I was not sure if this was classic UIP but clinically this makes sense especially of the lower lobe crackles.  Was supposed to discuss this in the case conference but this did not happen.  Every posted for discussion of the case conference but nevertheless his decline in PFTs and also this change now in the CT scan of the chest.  In the interim his serologies are normal.  I think at the least she has some plan for progressive ILD .  Possible is IPF but I would like to confirm this and discussion of  the case conference.  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  Went over the 2 antifibrotic's nintedanib and pirfenidone.  Shows pirfenidone because of the diarrhea side effects with nintedanib.  She has knee pain and be hard to get to the toilet.  In addition she has nonischemic cardiomyopathy and this blackbox warning for MI risk with with nintedanib.  [Even though she does not have ischemic cardiomyopathy].  Told her that pirfenidone associated more with weight loss nausea anorexia.  Both drugs required liver function monitoring.  Generally both drugs have reversible side effects.  In balance which was pirfenidone.  I  did note mild transaminitis in the past will check liver function test again today.   Xxxxxxxxx[id number]      OV 01/03/2023  Subjective:  Patient ID: [name], female , DOB: Dec 03, 1950 , age 80 y.o. , MRN: [id number] , ADDRESS: [address] [place] [address] PCP Georganna Skeans, MD Patient Care Team: Georganna Skeans, MD as PCP - General (Family Medicine) Duke Salvia, MD as PCP - Electrophysiology (Cardiology) Corky Crafts, MD as PCP - Cardiology (Cardiology) Griselda Miner, MD as Consulting Physician (General Surgery) Malachy Mood, MD as Consulting Physician (Hematology) Lurline Hare, MD as Consulting Physician (Radiation Oncology) Donnelly Angelica, RN as Registered Nurse Pershing Proud, RN as Registered Nurse Duke Salvia, MD as Consulting Physician (Cardiology) Hubbard Hartshorn, NP (Inactive) as Nurse Practitioner (Nurse Practitioner) [name], NP as Nurse Practitioner (Nurse Practitioner) Coralyn Helling, MD as Consulting Physician (Pulmonary Disease) Griselda Miner, MD as Consulting Physician (General Surgery) Pollyann Savoy, MD as Consulting Physician (Rheumatology)  This Provider for this visit: Treatment Team:  Attending Provider: Kalman Shan, MD    01/03/2023 -   Chief Complaint  Patient presents with   Follow-up    F/up wheezing, congestion, cough, sob with exertion.     HPI Sayuri Rhames 72 y.o. -returns for follow-up.  At this visit she was supposed to have started pirfenidone but what happened is that she had abnormal liver enzymes at the time of last visit.  We found that she was taking a lot of Tylenol for the knee pain.  She is now saying she did not take a lot but she actually did.  Her liver enzymes have since normalized as of December 07, 2022.  She is concerned about a high total protein.  She wanted to discuss this.  I did recommend to her that she discuss this with  primary care physician.  Then she brought her back pain.  I also suggested she discuss with primary care physician.  She wants repeat liver enzymes check especially because of total protein which she believes is because of the Repatha.  Meanwhile she continues to be symptomatic.  We discussed in the case conference in the results are below.  I did share these results with the patient.     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 NOv 2023: most rcent versus Feb 2020 HRCT. In msost recent Ct  basilar predmon, ILD, Subpleural reticuation with prominent TB. There is clear honeycombing (moderate) in lung base. No air trapping. Progressed sinc2020.  C/w UIP. In 2020: read as alternate diagnosis but Dr Ashley Murrain thinks in retrospect is mild to moderate  probable or c/w UIP with mild honeycombing. The bronchiectasis all traction bronchiectasis of

## 2023-05-23 NOTE — Telephone Encounter (Signed)
Rx team  [name] -having intense throat burning after taking Pradaxa.  Pradaxa is associated with greater than 25% GI side effects.  I think in the past she cannot afford Eliquis but she is willing to try Eliquis.  Please facilitate.

## 2023-05-23 NOTE — Addendum Note (Signed)
Addended by: Christen Butter on: 05/23/2023 03:29 PM   Modules accepted: Orders

## 2023-05-23 NOTE — Patient Instructions (Addendum)
ICD-10-CM   1. IPF (idiopathic pulmonary fibrosis) (HCC)  J84.112     2. Abnormal LFTs  R79.89     3. Dizziness  R42     4. Medication monitoring encounter  Z51.81     5. History of 2019 novel coronavirus disease (COVID-19)  Z86.16     6. History of pulmonary embolism  Z86.711     7. Burning sensation of throat  R07.0     8. OSA (obstructive sleep apnea)  G47.33     9. Travel advice encounter  Z71.84     10. Flu vaccine need  Z23        IPF (idiopathic pulmonary fibrosis) (HCC) Medication monitoring encounter History of abnormal liver function Dizziness possibly due to pirfenidone  -Clinically IPF appears to be stable and excess hypoxemia test slightly better compared to last time in August 2024. -New onset dizziness could be because of pirfenidone/Esbriet  Plan -Check liver function test today 05/23/2023 - Stop pirfenidone/Esbriet through June 01, 2023 Saturday  -Monitor if the dizziness resolves -Then on Sunday, June 02, 2023 restart Esbriet 1 pill 3 times a week and then a week later increase to 2 pills 3 times a week and then a week later go up to 3 pills 3 times a week  -Monitor if the dizziness comes back -Continue 2 L nasal cannula with exertion and monitor pulse ox and heart rate - refer pulmonary rehab  History of 2019 novel coronavirus disease (COVID-19) History of pulmonary embolism April 02, 2023 Throat burning with Pradaxa  -Pradaxa is reported to have greater than 20/25% GI side effects and therefore a throat burning despite PPI and H2 blockade very likely from Pradaxa   Plan -Check D-dimer today 05/23/2023 - Continue Pradaxa for now -NO GI cocktail becuae of allergy alert  - try TUMS -Will send a message to our pharmacy team to switch her to Eliquis [noting that previously cannot pay for this]   OSA (obstructive sleep apnea)  - Plan - Under the care of  sleep doctor Dr. Val Eagle  Travel advice encounter  Plan  - High-dose flu shot  today - Okay to travel by flight to [place] with standard COVID precautions apply   Followup  - 3-4 weeks video visit with nurse practitioner to assess dizziness with pirfenidone and throat burning with Pradaxa -Check LFTs every month - 4 months to spirometry and DLCO and return to see Dr. [name] in a 30-minute visit

## 2023-05-24 ENCOUNTER — Other Ambulatory Visit (HOSPITAL_COMMUNITY): Payer: Self-pay

## 2023-05-24 ENCOUNTER — Telehealth (HOSPITAL_COMMUNITY): Payer: Self-pay

## 2023-05-24 MED ORDER — APIXABAN 5 MG PO TABS
5.0000 mg | ORAL_TABLET | Freq: Two times a day (BID) | ORAL | 1 refills | Status: DC
Start: 2023-05-24 — End: 2023-11-11

## 2023-05-24 NOTE — Telephone Encounter (Signed)
Per test claim, copay for Eliquis is $0 for 30 day supply. Patient is probably at max OOP now with the pirfenidone pushing her though to no copay  Called patient to advise her to start Eliquis when next dose of Pradaxa would be due. She verbalized understanding that she is not to overlap tx of anticoagulants  Rx sent to [facility]today per pt request  Chesley Mires, PharmD, MPH, BCPS, CPP Clinical Pharmacist (Rheumatology and Pulmonology)

## 2023-05-24 NOTE — Telephone Encounter (Signed)
Called patient to see if she is interested in the Pulmonary Rehab Program. Patient expressed interest. Explained scheduling process and went over insurance, patient verbalized understanding. 

## 2023-05-24 NOTE — Telephone Encounter (Signed)
Pt insurance is active and benefits verified through Medicare A/B. Co-pay $0.00, DED $240.00/$240.00 met, out of pocket $0.00/$0.00 met, co-insurance 20%. No pre-authorization required. Passport, 05/24/23 @ 2:59PM   2ndary insurance is active and benefits verified through [place]. Co-pay $0.00, DED $0.00/$0.00 met, out of pocket $0.00/$0.00 met, co-insurance 0%. No pre-authorization required.

## 2023-05-24 NOTE — Progress Notes (Signed)
D-dimer going down nicely . LFT normal. Will NOT call you with result

## 2023-05-27 ENCOUNTER — Telehealth (HOSPITAL_COMMUNITY): Payer: Self-pay

## 2023-05-27 ENCOUNTER — Encounter: Payer: Self-pay | Admitting: Adult Health

## 2023-05-27 MED ORDER — CLOBAZAM 10 MG PO TABS: 10.0000 mg | ORAL_TABLET | Freq: Two times a day (BID) | ORAL | 1 refills | Status: DC

## 2023-05-27 NOTE — Telephone Encounter (Signed)
Called patient to see if she was interested in participating in the Pulmonary Rehab Program. Patient stated yes. Patient will come in for orientation on 06/03/23 @ 1PM and will attend the 1:15PM exercise class.   Pensions consultant.

## 2023-05-29 ENCOUNTER — Other Ambulatory Visit: Payer: Self-pay

## 2023-05-29 ENCOUNTER — Other Ambulatory Visit: Payer: Self-pay | Admitting: Internal Medicine

## 2023-05-29 ENCOUNTER — Ambulatory Visit (INDEPENDENT_AMBULATORY_CARE_PROVIDER_SITE_OTHER): Payer: Medicare Other

## 2023-05-29 DIAGNOSIS — I428 Other cardiomyopathies: Secondary | ICD-10-CM

## 2023-05-29 LAB — CUP PACEART REMOTE DEVICE CHECK
Battery Remaining Longevity: 10 mo
Battery Remaining Percentage: 10 %
Brady Statistic RV Percent Paced: 0 %
Date Time Interrogation Session: 20241009030100
HighPow Impedance: 76 Ohm
Implantable Lead Connection Status: 753985
Implantable Lead Implant Date: 20050603
Implantable Lead Location: 753860
Implantable Lead Model: 185
Implantable Lead Serial Number: 116340
Implantable Pulse Generator Implant Date: 20110505
Lead Channel Impedance Value: 625 Ohm
Lead Channel Pacing Threshold Amplitude: 0.8 V
Lead Channel Pacing Threshold Pulse Width: 0.4 ms
Lead Channel Setting Pacing Amplitude: 2.4 V
Lead Channel Setting Pacing Pulse Width: 0.4 ms
Lead Channel Setting Sensing Sensitivity: 0.4 mV
Pulse Gen Serial Number: 266301
Zone Setting Status: 755011

## 2023-06-03 ENCOUNTER — Encounter (HOSPITAL_COMMUNITY): Payer: Self-pay

## 2023-06-03 ENCOUNTER — Encounter (HOSPITAL_COMMUNITY)
Admission: RE | Admit: 2023-06-03 | Discharge: 2023-06-03 | Disposition: A | Discharge status: Home / Self Care | Payer: Medicare Other | Source: Ambulatory Visit | Attending: Internal Medicine | Admitting: Internal Medicine

## 2023-06-03 VITALS — BP 120/68 | HR 80 | Wt 220.7 lb

## 2023-06-03 DIAGNOSIS — J84112 Idiopathic pulmonary fibrosis: Secondary | ICD-10-CM

## 2023-06-03 NOTE — Progress Notes (Signed)
[name] 72 y.o. female Pulmonary Rehab Orientation Note This patient who was referred to Pulmonary Rehab by Dr. [name] with the diagnosis of IPF arrived today in Cardiac and Pulmonary Rehab. She  arrived ambulatory with normal gait. She  does carry portable oxygen. [employer] is the provider for their DME. Per patient, [name] uses oxygen with exercise. Color good, skin warm and dry. Patient is oriented to time and place. Patient's medical history, psychosocial health, and medications reviewed. Psychosocial assessment reveals patient lives alone. [name] is currently unemployed, disabled. Patient hobbies include watching tv and spending time with others. Patient reports her stress level is low. Areas of stress/anxiety include health. Patient does exhibit signs of depression. Signs of depression include guilt and hopelessness and fatigue. PHQ2/9 score 4/15. [name] shows good  coping skills with positive outlook on life. Offered emotional support and reassurance. Will continue to monitor. Physical assessment performed by Nurse pick: Adriana Hart RN. Please see their orientation physical assessment note. [name] reports she does take medications as prescribed. Patient states she follows a regular  diet. The patient reports no specific efforts to gain or lose weight.. Patient's weight will be monitored closely. Demonstration and practice of PLB using pulse oximeter. [name] able to return demonstration satisfactorily. Safety and hand hygiene in the exercise area reviewed with patient. [name] voices understanding of the information reviewed. Department expectations discussed with patient and achievable goals were set. The patient shows enthusiasm about attending the program and we look forward to working with [name]. [name] completed a 6 min walk test today and is scheduled to begin exercise on 10/22 @1 :15.   [id number] [name], BSRT

## 2023-06-03 NOTE — Progress Notes (Signed)
Pulmonary Individual Treatment Plan  Patient Details  Name: [name] MRN: [id number] Date of Birth: Mar 25, 1951 Referring Provider:   Doristine Devoid Pulmonary Rehab Walk Test from 06/03/2023 in [facility]  Referring Provider Ramaswamy       Initial Encounter Date:  Flowsheet Row Pulmonary Rehab Walk Test from 06/03/2023 in [facility]  Date 06/03/23       Visit Diagnosis: IPF (idiopathic pulmonary fibrosis) (HCC)  Patient's Home Medications on Admission:   Current Outpatient Medications:    albuterol (VENTOLIN HFA) 108 (90 Base) MCG/ACT inhaler, Inhale 2 puffs into the lungs every 6 (six) hours as needed for wheezing or shortness of breath., Disp: 18 g, Rfl: 3   apixaban (ELIQUIS) 5 MG TABS tablet, Take 1 tablet (5 mg total) by mouth 2 (two) times daily., Disp: 180 tablet, Rfl: 1   azelastine (ASTELIN) 0.1 % nasal spray, Place 1 spray into both nostrils 2 (two) times daily. Use in each nostril as directed, Disp: 90 mL, Rfl: 3   benzonatate (TESSALON) 200 MG capsule, Take 1 capsule (200 mg total) by mouth 3 (three) times daily as needed for cough., Disp: 90 capsule, Rfl: 2   budesonide-formoterol (SYMBICORT) 80-4.5 MCG/ACT inhaler, Inhale 1 puff into the lungs 2 (two) times daily., Disp: 1 each, Rfl: 12   Cholecalciferol (DIALYVITE VITAMIN D 5000) 125 MCG (5000 UT) capsule, Take 5,000 Units by mouth daily., Disp: , Rfl:    cloBAZam (ONFI) 10 MG tablet, Take 1 tablet (10 mg total) by mouth 2 (two) times daily., Disp: 180 tablet, Rfl: 1   diltiazem (CARDIZEM CD) 120 MG 24 hr capsule, Take 1 capsule (120 mg total) by mouth daily., Disp: 90 capsule, Rfl: 3   empagliflozin (JARDIANCE) 10 MG TABS tablet, Take 1 tablet (10 mg total) by mouth daily., Disp: 90 tablet, Rfl: 3   Evolocumab (REPATHA SURECLICK) 140 MG/ML SOAJ, Inject 140 mg into the skin every 14 (fourteen)  days., Disp: 6 mL, Rfl: 3   famotidine (PEPCID) 40 MG tablet, TAKE 1 TABLET AT BEDTIME (NEED MD APPOINTMENT FOR REFILLS), Disp: 90 tablet, Rfl: 3   fexofenadine (ALLEGRA) 180 MG tablet, Take 180 mg by mouth daily as needed for allergies (alternates with zyrtec sometimes)., Disp: , Rfl:    fluticasone (FLONASE) 50 MCG/ACT nasal spray, Place 2 sprays into both nostrils daily as needed for allergies or rhinitis., Disp: 48 g, Rfl: 3   FOLIC ACID PO, Take 1 tablet by mouth daily., Disp: , Rfl:    furosemide (LASIX) 20 MG tablet, TAKE 2 TABLETS (40 MG) 3 DAYS PER WEEK AND TAKE 1 TABLET (20 MG) THE OTHER DAYS OF THE WEEK (Patient taking differently: Take 20 mg by mouth daily as needed. PER PT TAKES PRN), Disp: 130 tablet, Rfl: 1   hydroquinone 4 % cream, Apply 1 Application topically at bedtime., Disp: , Rfl:    hydroxypropyl methylcellulose / hypromellose (ISOPTO TEARS / GONIOVISC) 2.5 % ophthalmic solution, Place 1 drop into both eyes at bedtime., Disp: , Rfl:    latanoprost (XALATAN) 0.005 % ophthalmic solution, Place 1 drop into both eyes at bedtime., Disp: , Rfl:    levocetirizine (XYZAL) 5 MG tablet, Take 5 mg by mouth daily at 12 noon., Disp: , Rfl:    levothyroxine (SYNTHROID) 50 MCG tablet, TAKE 1 TABLET EVERY DAY, Disp: 90 tablet, Rfl: 3   magnesium oxide (MAG-OX) 400 (240 Mg)  MG tablet, TAKE 1 TABLET TWICE DAILY, Disp: 180 tablet, Rfl: 3   montelukast (SINGULAIR) 10 MG tablet, TAKE 1 TABLET AT BEDTIME, Disp: 90 tablet, Rfl: 3   Multiple Vitamins-Minerals (MULTIVITAMIN PO), Take 1 tablet by mouth daily., Disp: , Rfl:    pantoprazole (PROTONIX) 40 MG tablet, TAKE 1 TABLET EVERY DAY, Disp: 90 tablet, Rfl: 3   Pirfenidone 267 MG TABS, Take 3 tablets (801 mg total) by mouth 3 (three) times daily with meals., Disp: 270 tablet, Rfl: 4   polyethylene glycol powder (GLYCOLAX/MIRALAX) powder, Take 17 grams by mouth daily, Disp: 1080 g, Rfl: 0   potassium chloride (KLOR-CON M) 10 MEQ tablet, TAKE 1/2  TABLET EVERY DAY (CALL OFFICE TO SCHEDULE YEARLY APPT), Disp: 45 tablet, Rfl: 3   promethazine-dextromethorphan (PROMETHAZINE-DM) 6.25-15 MG/5ML syrup, Take 5 mLs by mouth 4 (four) times daily as needed for cough., Disp: 118 mL, Rfl: 0   Respiratory Therapy Supplies (FLUTTER) DEVI, 1 Device by Does not apply route as needed., Disp: 1 each, Rfl: 0   sodium chloride HYPERTONIC 3 % nebulizer solution, Take by nebulization 2 (two) times daily as needed for cough. 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, Disp: 750 mL, Rfl: 12   tiZANidine (ZANAFLEX) 4 MG tablet, Take 0.5-1 tablets (2-4 mg total) by mouth 2 (two) times daily., Disp: 180 tablet, Rfl: 1   traZODone (DESYREL) 100 MG tablet, TAKE 1 TO 1 AND 1/2 TABLETS AT BEDTIME, Disp: 135 tablet, Rfl: 3  Current Facility-Administered Medications:    lidocaine (XYLOCAINE) 1 % (with pres) injection 1 mL, 1 mL, Other, Once, Lovorn, Megan, MD   lidocaine (XYLOCAINE) 1 % (with pres) injection 3 mL, 3 mL, Other, Once, Lovorn, Megan, MD  Past Medical History: 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   Bronchiectasis (HCC)    Cerebral aneurysm, nonruptured    had a clip put in   CHF (congestive heart failure) (HCC)    Clostridium difficile infection    Depressive disorder, not elsewhere classified    Diverticulosis of colon (without mention of hemorrhage)    Esophageal reflux    Fatty liver    Fibromyalgia    Gastritis    GERD (gastroesophageal reflux disease)    GI bleed 2004   Glaucoma    Hiatal hernia    History of COVID-19 05/04/2021   Hyperlipidemia    Hypertension    Hypothyroidism    Internal hemorrhoids    Joint pain    Multifocal pneumonia 06/03/2021   Obstructive sleep apnea (adult) (pediatric)    Osteoarthritis    Ostium secundum type atrial septal defect    Other chronic  nonalcoholic liver disease    Other pulmonary embolism and infarction    Palpitations    Paroxysmal ventricular tachycardia (HCC)    Personal history of radiation therapy    PONV (postoperative nausea and vomiting)    Presence of permanent cardiac pacemaker    PUD (peptic ulcer disease)    Radiation 02/03/15-03/10/15   Right Breast   Sarcoid    per pt , not sure   Schatzki's ring    Shortness of breath    Sleep apnea    Stroke [facility]) 2013   tia/ pt feels it was around 2008 0r 2009   Takotsubo syndrome    Tubular  adenoma of colon    Unspecified transient cerebral ischemia    Unspecified vitamin D deficiency     Tobacco Use: Social History   Tobacco Use  Smoking Status Former   Current packs/day: 0.00   Average packs/day: 1 pack/day for 20.0 years (20.0 ttl pk-yrs)   Types: Cigarettes   Start date: 08/20/1969   Quit date: 08/20/1989   Years since quitting: 33.8   Passive exposure: Past  Smokeless Tobacco Never    Labs: Review Flowsheet  More data exists      Latest Ref Rng & Units 12/19/2021 06/27/2022 09/07/2022 10/29/2022 11/16/2022  Labs for ITP Cardiac and Pulmonary Rehab  Cholestrol 100 - 199 mg/dL - - 478  295  621   LDL (calc) 0 - 99 mg/dL - - 308  657  846   HDL-C >39 mg/dL - - 84  63  81   Trlycerides 0 - 149 mg/dL - - 77  962  92   Hemoglobin A1c 4.8 - 5.6 % 5.7  5.3  - - -    Details            Capillary Blood Glucose: No results found for: "GLUCAP"   Pulmonary Assessment Scores:  Pulmonary Assessment Scores     Row Name 06/03/23 1352         ADL UCSD   ADL Phase Entry     SOB Score total 82       CAT Score   CAT Score 28       mMRC Score   mMRC Score 4             UCSD: Self-administered rating of dyspnea associated with activities of daily living (ADLs) 6-point scale (0 = "not at all" to 5 = "maximal or unable to do because of breathlessness")  Scoring Scores range from 0 to 120.  Minimally important difference is 5  units  CAT: CAT can identify the health impairment of COPD patients and is better correlated with disease progression.  CAT has a scoring range of zero to 40. The CAT score is classified into four groups of low (less than 10), medium (10 - 20), high (21-30) and very high (31-40) based on the impact level of disease on health status. A CAT score over 10 suggests significant symptoms.  A worsening CAT score could be explained by an exacerbation, poor medication adherence, poor inhaler technique, or progression of COPD or comorbid conditions.  CAT MCID is 2 points  mMRC: mMRC (Modified Medical Research Council) Dyspnea Scale is used to assess the degree of baseline functional disability in patients of respiratory disease due to dyspnea. No minimal important difference is established. A decrease in score of 1 point or greater is considered a positive change.   Pulmonary Function Assessment:  Pulmonary Function Assessment - 06/03/23 1337       Breath   Bilateral Breath Sounds Rales;Basilar    Shortness of Breath Yes;Limiting activity;Panic with Shortness of Breath             Exercise Target Goals: Exercise Program Goal: Individual exercise prescription set using results from initial 6 min walk test and THRR while considering  patient's activity barriers and safety.   Exercise Prescription Goal: Initial exercise prescription builds to 30-45 minutes a day of aerobic activity, 2-3 days per week.  Home exercise guidelines will be given to patient during program as part of exercise prescription that the participant will acknowledge.  Activity Barriers & Risk Stratification:  Activity Barriers & Cardiac Risk Stratification - 06/03/23 1332       Activity Barriers & Cardiac Risk Stratification   Activity Barriers Back Problems;Left Knee Replacement;Right Knee Replacement;Deconditioning;Muscular Weakness;Shortness of Breath;Fibromyalgia    Cardiac Risk Stratification Moderate              6 Minute Walk:  6 Minute Walk     Row Name 06/03/23 1428         6 Minute Walk   Phase Initial     Distance 790 feet     Walk Time 6 minutes     # of Rest Breaks 0     MPH 1.5     METS 1.4     RPE 13     Perceived Dyspnea  3     VO2 Peak 4.94     Symptoms Yes (comment)     Comments Pt SOB but SpO2 never lower than 89 RA     Resting HR 80 bpm     Resting BP 120/68     Resting Oxygen Saturation  97 %     Exercise Oxygen Saturation  during 6 min walk 89 %     Max Ex. HR 107 bpm     Max Ex. BP 138/70     2 Minute Post BP 130/70       Interval HR   1 Minute HR 92     2 Minute HR 100     3 Minute HR 104     4 Minute HR 107     5 Minute HR 107     6 Minute HR 107     2 Minute Post HR 90     Interval Heart Rate? Yes       Interval Oxygen   Interval Oxygen? Yes     Baseline Oxygen Saturation % 97 %     1 Minute Oxygen Saturation % 95 %     1 Minute Liters of Oxygen 0 L     2 Minute Oxygen Saturation % 94 %     2 Minute Liters of Oxygen 0 L     3 Minute Oxygen Saturation % 91 %     3 Minute Liters of Oxygen 0 L     4 Minute Oxygen Saturation % 89 %     4 Minute Liters of Oxygen 0 L     5 Minute Oxygen Saturation % 89 %     5 Minute Liters of Oxygen 0 L     6 Minute Oxygen Saturation % 90 %     6 Minute Liters of Oxygen 0 L     2 Minute Post Oxygen Saturation % 91 %     2 Minute Post Liters of Oxygen 0 L              Oxygen Initial Assessment:  Oxygen Initial Assessment - 06/03/23 1334       Home Oxygen   Home Oxygen Device Home Concentrator;E-Tanks    Sleep Oxygen Prescription CPAP    Home Exercise Oxygen Prescription Continuous    Liters per minute 2    Home Resting Oxygen Prescription None    Compliance with Home Oxygen Use Yes      Initial 6 min Walk   Oxygen Used None    Liters per minute --      Program Oxygen Prescription   Program Oxygen Prescription None    Liters per minute --  Intervention   Short Term Goals To learn and  exhibit compliance with exercise, home and travel O2 prescription;To learn and understand importance of monitoring SPO2 with pulse oximeter and demonstrate accurate use of the pulse oximeter.;To learn and understand importance of maintaining oxygen saturations>88%;To learn and demonstrate proper pursed lip breathing techniques or other breathing techniques. ;To learn and demonstrate proper use of respiratory medications    Long  Term Goals Exhibits compliance with exercise, home  and travel O2 prescription;Maintenance of O2 saturations>88%;Compliance with respiratory medication;Verbalizes importance of monitoring SPO2 with pulse oximeter and return demonstration;Exhibits proper breathing techniques, such as pursed lip breathing or other method taught during program session;Demonstrates proper use of MDI's             Oxygen Re-Evaluation:   Oxygen Discharge (Final Oxygen Re-Evaluation):   Initial Exercise Prescription:  Initial Exercise Prescription - 06/03/23 1400       Date of Initial Exercise RX and Referring Provider   Date 06/03/23    Referring Provider Ramaswamy    Expected Discharge Date 08/27/23      NuStep   Level 1    SPM 70    Minutes 15    METs 1.5      Track   Minutes 15    METs 1.5      Prescription Details   Frequency (times per week) 2    Duration Progress to 30 minutes of continuous aerobic without signs/symptoms of physical distress      Intensity   THRR 40-80% of Max Heartrate 58-117    Ratings of Perceived Exertion 11-13    Perceived Dyspnea 0-4      Progression   Progression Continue to progress workloads to maintain intensity without signs/symptoms of physical distress.      Resistance Training   Training Prescription Yes    Weight red bands    Reps 10-15             Perform Capillary Blood Glucose checks as needed.  Exercise Prescription Changes:   Exercise Comments:   Exercise Goals and Review:   Exercise Goals     Row Name  06/03/23 1334             Exercise Goals   Increase Physical Activity Yes       Intervention Provide advice, education, support and counseling about physical activity/exercise needs.;Develop an individualized exercise prescription for aerobic and resistive training based on initial evaluation findings, risk stratification, comorbidities and participant's personal goals.       Expected Outcomes Short Term: Attend rehab on a regular basis to increase amount of physical activity.;Long Term: Add in home exercise to make exercise part of routine and to increase amount of physical activity.;Long Term: Exercising regularly at least 3-5 days a week.       Increase Strength and Stamina Yes       Intervention Provide advice, education, support and counseling about physical activity/exercise needs.;Develop an individualized exercise prescription for aerobic and resistive training based on initial evaluation findings, risk stratification, comorbidities and participant's personal goals.       Expected Outcomes Short Term: Increase workloads from initial exercise prescription for resistance, speed, and METs.;Short Term: Perform resistance training exercises routinely during rehab and add in resistance training at home;Long Term: Improve cardiorespiratory fitness, muscular endurance and strength as measured by increased METs and functional capacity ( )       Able to understand and use rate of perceived exertion (RPE) scale Yes  Intervention Provide education and explanation on how to use RPE scale       Expected Outcomes Short Term: Able to use RPE daily in rehab to express subjective intensity level;Long Term:  Able to use RPE to guide intensity level when exercising independently       Able to understand and use Dyspnea scale Yes       Intervention Provide education and explanation on how to use Dyspnea scale       Expected Outcomes Short Term: Able to use Dyspnea scale daily in rehab to express  subjective sense of shortness of breath during exertion;Long Term: Able to use Dyspnea scale to guide intensity level when exercising independently       Knowledge and understanding of Target Heart Rate Range (THRR) Yes       Intervention Provide education and explanation of THRR including how the numbers were predicted and where they are located for reference       Expected Outcomes Short Term: Able to state/look up THRR;Long Term: Able to use THRR to govern intensity when exercising independently;Short Term: Able to use daily as guideline for intensity in rehab       Understanding of Exercise Prescription Yes       Intervention Provide education, explanation, and written materials on patient's individual exercise prescription       Expected Outcomes Long Term: Able to explain home exercise prescription to exercise independently;Short Term: Able to explain program exercise prescription                Exercise Goals Re-Evaluation :   Discharge Exercise Prescription (Final Exercise Prescription Changes):   Nutrition:  Target Goals: Understanding of nutrition guidelines, daily intake of sodium 1500mg , cholesterol 200mg , calories 30% from fat and 7% or less from saturated fats, daily to have 5 or more servings of fruits and vegetables.  Biometrics:  Pre Biometrics - 06/03/23 1443       Pre Biometrics   Grip Strength 20 kg              Nutrition Therapy Plan and Nutrition Goals:   Nutrition Assessments:  MEDIFICTS Score Key: >=70 Need to make dietary changes  40-70 Heart Healthy Diet <= 40 Therapeutic Level Cholesterol Diet   Picture Your Plate Scores: <16 Unhealthy dietary pattern with much room for improvement. 41-50 Dietary pattern unlikely to meet recommendations for good health and room for improvement. 51-60 More healthful dietary pattern, with some room for improvement.  >60 Healthy dietary pattern, although there may be some specific behaviors that could be  improved.    Nutrition Goals Re-Evaluation:   Nutrition Goals Discharge (Final Nutrition Goals Re-Evaluation):   Psychosocial: Target Goals: Acknowledge presence or absence of significant depression and/or stress, maximize coping skills, provide positive support system. Participant is able to verbalize types and ability to use techniques and skills needed for reducing stress and depression.  Initial Review & Psychosocial Screening:  Initial Psych Review & Screening - 06/03/23 1327       Initial Review   Current issues with Current Depression;History of Depression      Family Dynamics   Good Support System? Yes      Barriers   Psychosocial barriers to participate in program Psychosocial barriers identified (see note)      Screening Interventions   Interventions Encouraged to exercise;To provide support and resources with identified psychosocial needs    Expected Outcomes Short Term goal: Utilizing psychosocial counselor, staff and physician to assist with identification  of specific Stressors or current issues interfering with healing process. Setting desired goal for each stressor or current issue identified.;Long Term Goal: Stressors or current issues are controlled or eliminated.;Short Term goal: Identification and review with participant of any Quality of Life or Depression concerns found by scoring the questionnaire.;Long Term goal: The participant improves quality of Life and PHQ9 Scores as seen by post scores and/or verbalization of changes             Quality of Life Scores:  Scores of 19 and below usually indicate a poorer quality of life in these areas.  A difference of  2-3 points is a clinically meaningful difference.  A difference of 2-3 points in the total score of the Quality of Life Index has been associated with significant improvement in overall quality of life, self-image, physical symptoms, and general health in studies assessing change in quality of  life.  PHQ-9: Review Flowsheet  More data exists      06/03/2023 05/07/2023 04/16/2023 03/06/2023 02/13/2023  Depression screen PHQ 2/9  Decreased Interest 2 1 1 2 1   Down, Depressed, Hopeless 2 1 1  0 1  PHQ - 2 Score 4 2 2 2 2   Altered sleeping 3 1 1  0 -  Tired, decreased energy 2 1 1 2  -  Change in appetite 2 0 0 0 -  Feeling bad or failure about yourself  2 1 1 1  -  Trouble concentrating 1 1 1 2  -  Moving slowly or fidgety/restless 1 1 1  0 -  Suicidal thoughts 0 0 0 0 -  PHQ-9 Score 15 7 7 7  -  Difficult doing work/chores Somewhat difficult Somewhat difficult Somewhat difficult Not difficult at all -    Details           Interpretation of Total Score  Total Score Depression Severity:  1-4 = Minimal depression, 5-9 = Mild depression, 10-14 = Moderate depression, 15-19 = Moderately severe depression, 20-27 = Severe depression   Psychosocial Evaluation and Intervention:  Psychosocial Evaluation - 06/03/23 1329       Psychosocial Evaluation & Interventions   Interventions Encouraged to exercise with the program and follow exercise prescription    Comments Darrion's pre PHQ9 was 15. She currently feels depressed and is frustrated becuase she does not know the cause of her IPF. She has an appointment with her primary doctor to address her current depression.    Expected Outcomes For Keyani to participate in PR free of any psychosocial barriers or concerns.    Continue Psychosocial Services  Follow up required by staff             Psychosocial Re-Evaluation:   Psychosocial Discharge (Final Psychosocial Re-Evaluation):   Education: Education Goals: Education classes will be provided on a weekly basis, covering required topics. Participant will state understanding/return demonstration of topics presented.  Learning Barriers/Preferences:  Learning Barriers/Preferences - 06/03/23 1329       Learning Barriers/Preferences   Learning Barriers Sight   needs cataract surgery  on the left eye   Learning Preferences Written Material             Education Topics: Know Your Numbers Group instruction that is supported by a PowerPoint presentation. Instructor discusses importance of knowing and understanding resting, exercise, and post-exercise oxygen saturation, heart rate, and blood pressure. Oxygen saturation, heart rate, blood pressure, rating of perceived exertion, and dyspnea are reviewed along with a normal range for these values.    Exercise for the Pulmonary  Patient Group instruction that is supported by a PowerPoint presentation. Instructor discusses benefits of exercise, core components of exercise, frequency, duration, and intensity of an exercise routine, importance of utilizing pulse oximetry during exercise, safety while exercising, and options of places to exercise outside of rehab.    MET Level  Group instruction provided by PowerPoint, verbal discussion, and written material to support subject matter. Instructor reviews what METs are and how to increase METs.    Pulmonary Medications Verbally interactive group education provided by instructor with focus on inhaled medications and proper administration.   Anatomy and Physiology of the Respiratory System Group instruction provided by PowerPoint, verbal discussion, and written material to support subject matter. Instructor reviews respiratory cycle and anatomical components of the respiratory system and their functions. Instructor also reviews differences in obstructive and restrictive respiratory diseases with examples of each.    Oxygen Safety Group instruction provided by PowerPoint, verbal discussion, and written material to support subject matter. There is an overview of "What is Oxygen" and "Why do we need it".  Instructor also reviews how to create a safe environment for oxygen use, the importance of using oxygen as prescribed, and the risks of noncompliance. There is a brief discussion on  traveling with oxygen and resources the patient may utilize.   Oxygen Use Group instruction provided by PowerPoint, verbal discussion, and written material to discuss how supplemental oxygen is prescribed and different types of oxygen supply systems. Resources for more information are provided.    Breathing Techniques Group instruction that is supported by demonstration and informational handouts. Instructor discusses the benefits of pursed lip and diaphragmatic breathing and detailed demonstration on how to perform both.     Risk Factor Reduction Group instruction that is supported by a PowerPoint presentation. Instructor discusses the definition of a risk factor, different risk factors for pulmonary disease, and how the heart and lungs work together.   Pulmonary Diseases Group instruction provided by PowerPoint, verbal discussion, and written material to support subject matter. Instructor gives an overview of the different type of pulmonary diseases. There is also a discussion on risk factors and symptoms as well as ways to manage the diseases.   Stress and Energy Conservation Group instruction provided by PowerPoint, verbal discussion, and written material to support subject matter. Instructor gives an overview of stress and the impact it can have on the body. Instructor also reviews ways to reduce stress. There is also a discussion on energy conservation and ways to conserve energy throughout the day.   Warning Signs and Symptoms Group instruction provided by PowerPoint, verbal discussion, and written material to support subject matter. Instructor reviews warning signs and symptoms of stroke, heart attack, cold and flu. Instructor also reviews ways to prevent the spread of infection.   Other Education Group or individual verbal, written, or video instructions that support the educational goals of the pulmonary rehab program.    Knowledge Questionnaire Score:  Knowledge  Questionnaire Score - 06/03/23 1353       Knowledge Questionnaire Score   Pre Score 13/18             Core Components/Risk Factors/Patient Goals at Admission:  Personal Goals and Risk Factors at Admission - 06/03/23 1330       Core Components/Risk Factors/Patient Goals on Admission    Weight Management Yes;Weight Loss    Intervention Weight Management: Develop a combined nutrition and exercise program designed to reach desired caloric intake, while maintaining appropriate intake of nutrient and fiber, sodium  and fats, and appropriate energy expenditure required for the weight goal.;Weight Management: Provide education and appropriate resources to help participant work on and attain dietary goals.;Weight Management/Obesity: Establish reasonable short term and long term weight goals.;Obesity: Provide education and appropriate resources to help participant work on and attain dietary goals.    Expected Outcomes Short Term: Continue to assess and modify interventions until short term weight is achieved;Long Term: Adherence to nutrition and physical activity/exercise program aimed toward attainment of established weight goal;Weight Loss: Understanding of general recommendations for a balanced deficit meal plan, which promotes 1-2 lb weight loss per week and includes a negative energy balance of [id number] kcal/d;Understanding recommendations for meals to include 15-35% energy as protein, 25-35% energy from fat, 35-60% energy from carbohydrates, less than 200mg  of dietary cholesterol, 20-35 gm of total fiber daily;Understanding of distribution of calorie intake throughout the day with the consumption of 4-5 meals/snacks    Improve shortness of breath with ADL's Yes    Intervention Provide education, individualized exercise plan and daily activity instruction to help decrease symptoms of SOB with activities of daily living.    Expected Outcomes Short Term: Improve cardiorespiratory fitness to achieve a  reduction of symptoms when performing ADLs;Long Term: Be able to perform more ADLs without symptoms or delay the onset of symptoms             Core Components/Risk Factors/Patient Goals Review:    Core Components/Risk Factors/Patient Goals at Discharge (Final Review):    ITP Comments:   Comments: Dr. Mechele Collin is Medical Director for Pulmonary Rehab at [facility].

## 2023-06-03 NOTE — Progress Notes (Signed)
[name] 72 y.o. female  Initial Psychosocial Assessment  Pt psychosocial assessment reveals pt lives alone. Pt is currently unemployed, disabled. Pt hobbies include watching tv and spending time with others. Pt reports her stress level is low. Areas of stress/anxiety include health.  Pt does exhibit signs of depression. Signs of depression include guilt and hopelessness and fatigue. Pt shows good  coping skills with positive outlook . Offered emotional support and reassurance. Monitor and evaluate progress toward psychosocial goal(s).  Goal(s): Improved management of depression Improved coping skills Help patient work toward returning to meaningful activities that improve patient's QOL and are attainable with patient's lung disease   06/03/2023 2:01 PM

## 2023-06-04 ENCOUNTER — Telehealth: Payer: Self-pay | Admitting: Interventional Cardiology

## 2023-06-04 ENCOUNTER — Other Ambulatory Visit: Payer: Self-pay

## 2023-06-04 ENCOUNTER — Encounter: Payer: Self-pay | Admitting: Internal Medicine

## 2023-06-04 ENCOUNTER — Encounter: Payer: Medicare Other | Admitting: Licensed Clinical Social Worker

## 2023-06-04 MED ORDER — FUROSEMIDE 20 MG PO TABS: ORAL_TABLET | ORAL | 2 refills | Status: DC

## 2023-06-04 NOTE — Telephone Encounter (Signed)
*  STAT* If patient is at the pharmacy, call can be transferred to refill team.   1. Which medications need to be refilled? (please list name of each medication and dose if known)   furosemide (LASIX) 20 MG tablet   2. Would you like to learn more about the convenience, safety, & potential cost savings by using the [facility]?   3. Are you open to using the [facility] (Type [facility]. ).  4. Which pharmacy/location (including street and city if local pharmacy) is medication to be sent to?  [facility] - [place], [place] - [address]   5. Do they need a 30 day or 90 day supply?   90 day  Caller (Tabitha) stated patient is completely out of this medication.

## 2023-06-05 ENCOUNTER — Telehealth: Payer: Self-pay | Admitting: *Deleted

## 2023-06-05 ENCOUNTER — Other Ambulatory Visit: Payer: Self-pay | Admitting: *Deleted

## 2023-06-05 ENCOUNTER — Other Ambulatory Visit (INDEPENDENT_AMBULATORY_CARE_PROVIDER_SITE_OTHER): Payer: Medicare Other

## 2023-06-05 DIAGNOSIS — K219 Gastro-esophageal reflux disease without esophagitis: Secondary | ICD-10-CM

## 2023-06-05 DIAGNOSIS — K760 Fatty (change of) liver, not elsewhere classified: Secondary | ICD-10-CM

## 2023-06-05 LAB — PROTIME-INR
INR: 1.2 {ratio} — ABNORMAL HIGH (ref 0.8–1.0)
Prothrombin Time: 12.4 s (ref 9.6–13.1)

## 2023-06-05 MED ORDER — ALBUTEROL SULFATE HFA 108 (90 BASE) MCG/ACT IN AERS: 2.0000 | INHALATION_SPRAY | Freq: Four times a day (QID) | RESPIRATORY_TRACT | 1 refills | Status: DC | PRN

## 2023-06-05 NOTE — Telephone Encounter (Signed)
Called patient to inform she needs to have lab drawn for INR. Patient asked what the INR was and explained to patient has to do with clotting time. Also informed the patient she is able to go to the lab to have lab drawn M-F, 7:30-5pm located in the basement. Patient states she is aware where th lab is located. Patient has also stated her pulmonologist did labs, informed her the lab her pulmonologist ordered was the same lab that was ordered via Dr. Rhea Belton and that lab, the Hepatic function Panel, will not need to be reordered. INR does need to be drawn. Patient states she will come in to  have the INR drawn.

## 2023-06-05 NOTE — Telephone Encounter (Signed)
-----   Message from Carie Caddy Pyrtle sent at 06/05/2023  1:55 PM EDT ----- Regarding: RE: reorder of hepatic function panel/ done via Dr. [name] on 05/23/23? Does not need to be repeated Thanks JMP ----- Message ----- From: Avanell Shackleton, RN Sent: 06/05/2023  10:10 AM EDT To: Beverley Fiedler, MD Subject: reorder of hepatic function panel/ done via #  Dr. Rhea Belton,  You ordered a recheck of patient's Hepatic function panel and INR in 3 months on 03/04/23. Dr. [name] ordered a Hepatic function for this patient on 05/23/23. Do I need to reorder the Hepatic function panel again since it has already been done via Dr. [name]?

## 2023-06-06 ENCOUNTER — Encounter: Payer: Self-pay | Admitting: Internal Medicine

## 2023-06-06 ENCOUNTER — Telehealth: Payer: Self-pay | Admitting: Neurology

## 2023-06-06 NOTE — Telephone Encounter (Signed)
Received sleep referral from Warnell Forester PA at [facility] for insomnia. Placed in sleep referrals box

## 2023-06-09 ENCOUNTER — Encounter: Payer: Self-pay | Admitting: Internal Medicine

## 2023-06-10 ENCOUNTER — Other Ambulatory Visit: Payer: Self-pay

## 2023-06-10 MED ORDER — BUDESONIDE-FORMOTEROL FUMARATE 80-4.5 MCG/ACT IN AERO: 1.0000 | INHALATION_SPRAY | Freq: Two times a day (BID) | RESPIRATORY_TRACT | 4 refills | Status: DC

## 2023-06-11 ENCOUNTER — Encounter (HOSPITAL_COMMUNITY)
Admission: RE | Admit: 2023-06-11 | Discharge: 2023-06-11 | Disposition: A | Discharge status: Home / Self Care | Payer: Medicare Other | Source: Ambulatory Visit | Attending: Internal Medicine

## 2023-06-11 DIAGNOSIS — J84112 Idiopathic pulmonary fibrosis: Secondary | ICD-10-CM | POA: Diagnosis not present

## 2023-06-11 NOTE — Progress Notes (Signed)
Daily Session Note  Patient Details  Name: [name] MRN: [id number] Date of Birth: 1951/07/04 Referring Provider:   Doristine Devoid Pulmonary Rehab Walk Test from 06/03/2023 in [facility]  Referring Provider Ramaswamy       Encounter Date: 06/11/2023  Check In:  Session Check In - 06/11/23 1329       Check-In   Supervising physician immediately available to respond to emergencies [facility] MD immediately available    Physician(s) Edd Fabian, NP    Location [facility]    Staff Present Essie Hart, RN, BSN;Casey Katrinka Blazing, RT;Samantha Belarus, RD, Rexene Agent, MS, ACSM-CEP, Exercise Physiologist;Randi Dionisio Paschal, ACSM-CEP, Exercise Physiologist    Virtual Visit No    Medication changes reported     No    Fall or balance concerns reported    No    Tobacco Cessation No Change    Warm-up and Cool-down Performed as group-led instruction    Resistance Training Performed Yes    VAD Patient? No    PAD/SET Patient? No      Pain Assessment   Currently in Pain? No/denies    Multiple Pain Sites No             Capillary Blood Glucose: No results found. However, due to the size of the patient record, not all encounters were searched. Please check Results Review for a complete set of results.    Social History   Tobacco Use  Smoking Status Former   Current packs/day: 0.00   Average packs/day: 1 pack/day for 20.0 years (20.0 ttl pk-yrs)   Types: Cigarettes   Start date: 08/20/1969   Quit date: 08/20/1989   Years since quitting: 33.8   Passive exposure: Past  Smokeless Tobacco Never    Goals Met:  Independence with exercise equipment Exercise tolerated well No report of concerns or symptoms today Strength training completed today  Goals Unmet:  Not Applicable  Comments: Service time is from 1321 to 1438  Dr. Mechele Collin is Medical Director for Pulmonary Rehab at [facility].

## 2023-06-13 ENCOUNTER — Encounter (HOSPITAL_COMMUNITY)
Admission: RE | Admit: 2023-06-13 | Discharge: 2023-06-13 | Disposition: A | Discharge status: Home / Self Care | Payer: Medicare Other | Source: Ambulatory Visit | Attending: Internal Medicine | Admitting: Internal Medicine

## 2023-06-13 DIAGNOSIS — J84112 Idiopathic pulmonary fibrosis: Secondary | ICD-10-CM

## 2023-06-13 NOTE — Progress Notes (Signed)
Daily Session Note  Patient Details  Name: [name] MRN: [id number] Date of Birth: [birth year] Referring Provider:   Doristine Devoid Pulmonary Rehab Walk Test from 06/03/2023 in [facility]  Referring Provider Ramaswamy       Encounter Date: 06/13/2023  Check In:  Session Check In - 06/13/23 1515       Check-In   Supervising physician immediately available to respond to emergencies [facility] MD immediately available    Physician(s) Eligha Bridegroom, NP    [facility]    Staff Present Essie Hart, RN, BSN;Casey Katrinka Blazing, RT;Samantha Belarus, RD, Rexene Agent, MS, ACSM-CEP, Exercise Physiologist;Randi Dionisio Paschal, ACSM-CEP, Exercise Physiologist    Virtual Visit No    Medication changes reported     No    Fall or balance concerns reported    No    Tobacco Cessation No Change    Warm-up and Cool-down Performed as group-led instruction    Resistance Training Performed Yes    VAD Patient? No    PAD/SET Patient? No      Pain Assessment   Currently in Pain? No/denies    Multiple Pain Sites No             Capillary Blood Glucose: No results found. However, due to the size of the patient record, not all encounters were searched. Please check Results Review for a complete set of results.    Social History   Tobacco Use  Smoking Status Former   Current packs/day: 0.00   Average packs/day: 1 pack/day for 20.0 years (20.0 ttl pk-yrs)   Types: Cigarettes   Start date: 08/20/1969   Quit date: 08/20/1989   Years since quitting: 33.8   Passive exposure: Past  Smokeless Tobacco Never    Goals Met:  Independence with exercise equipment Exercise tolerated well No report of concerns or symptoms today Strength training completed today  Goals Unmet:  Not Applicable  Comments: Service time is from 1311 to 1456  Dr. Mechele Collin is Medical Director for Pulmonary Rehab at [facility].

## 2023-06-17 ENCOUNTER — Emergency Department (HOSPITAL_COMMUNITY): Payer: Medicare Other

## 2023-06-17 ENCOUNTER — Other Ambulatory Visit: Payer: Self-pay

## 2023-06-17 ENCOUNTER — Encounter (HOSPITAL_COMMUNITY): Payer: Self-pay | Admitting: Emergency Medicine

## 2023-06-17 ENCOUNTER — Emergency Department (HOSPITAL_COMMUNITY)
Admission: EM | Admit: 2023-06-17 | Discharge: 2023-06-17 | Disposition: A | Discharge status: Home / Self Care | Payer: Medicare Other | Attending: Emergency Medicine | Admitting: Emergency Medicine

## 2023-06-17 DIAGNOSIS — I509 Heart failure, unspecified: Secondary | ICD-10-CM | POA: Insufficient documentation

## 2023-06-17 DIAGNOSIS — R072 Precordial pain: Secondary | ICD-10-CM | POA: Diagnosis present

## 2023-06-17 DIAGNOSIS — R079 Chest pain, unspecified: Secondary | ICD-10-CM | POA: Diagnosis not present

## 2023-06-17 DIAGNOSIS — Z7901 Long term (current) use of anticoagulants: Secondary | ICD-10-CM | POA: Insufficient documentation

## 2023-06-17 DIAGNOSIS — Z86711 Personal history of pulmonary embolism: Secondary | ICD-10-CM | POA: Insufficient documentation

## 2023-06-17 LAB — CBC
HCT: 42 % (ref 36.0–46.0)
Hemoglobin: 13 g/dL (ref 12.0–15.0)
MCH: 29.1 pg (ref 26.0–34.0)
MCHC: 31 g/dL (ref 30.0–36.0)
MCV: 94 fL (ref 80.0–100.0)
Platelets: 178 10*3/uL (ref 150–400)
RBC: 4.47 MIL/uL (ref 3.87–5.11)
RDW: 13 % (ref 11.5–15.5)
WBC: 4.5 10*3/uL (ref 4.0–10.5)
nRBC: 0 % (ref 0.0–0.2)

## 2023-06-17 LAB — BASIC METABOLIC PANEL
Anion gap: 12 (ref 5–15)
BUN: 6 mg/dL — ABNORMAL LOW (ref 8–23)
CO2: 21 mmol/L — ABNORMAL LOW (ref 22–32)
Calcium: 9.7 mg/dL (ref 8.9–10.3)
Chloride: 108 mmol/L (ref 98–111)
Creatinine, Ser: 0.89 mg/dL (ref 0.44–1.00)
GFR, Estimated: >60 mL/min (ref >60)
Glucose, Bld: 146 mg/dL — ABNORMAL HIGH (ref 70–99)
Potassium: 3.6 mmol/L (ref 3.5–5.1)
Sodium: 141 mmol/L (ref 135–145)

## 2023-06-17 LAB — BRAIN NATRIURETIC PEPTIDE: B Natriuretic Peptide: 56.8 pg/mL (ref 0.0–100.0)

## 2023-06-17 LAB — TROPONIN I (HIGH SENSITIVITY)
Troponin I (High Sensitivity): 11 ng/L (ref <18)
Troponin I (High Sensitivity): 8 ng/L (ref <18)

## 2023-06-17 MED ORDER — IOHEXOL 350 MG/ML SOLN
75.0000 mL | Freq: Once | INTRAVENOUS | Status: AC | PRN
  Administered 2023-06-17: 75 mL via INTRAVENOUS

## 2023-06-17 NOTE — ED Triage Notes (Signed)
Pt here from home with c/o chest pain non radiating some sob ( pt does have chf and is on lasix

## 2023-06-17 NOTE — ED Provider Notes (Signed)
[place] EMERGENCY DEPARTMENT AT [facility] Provider Note   CSN: [id number] Arrival date & time: 06/17/23  1323     History {Add pertinent medical, surgical, social history, OB history to HPI:1} Chief Complaint  Patient presents with   Chest Pain    [name] is a 72 y.o. female.  72 year old female with prior medical history as detailed below presents for evaluation.  She complains of sharp midsternal chest discomfort.  This began earlier today as she was leaving her house.  This is improved.  She reports associated shortness of breath.  Supplemental 2 L nasal cannula O2 has relieved the symptoms.  She denies fever.  Primary history is significant for CHF for which she intermittently will use Lasix.  She also reports history of PE.  She reports full compliance with previously prescribed Eliquis.  She denies significant edema in the lower extremities.  She denies weight gain.  The history is provided by the patient and medical records.       Home Medications Prior to Admission medications   Medication Sig Start Date End Date Taking? Authorizing Provider  albuterol (VENTOLIN HFA) 108 (90 Base) MCG/ACT inhaler Inhale 2 puffs into the lungs every 6 (six) hours as needed for wheezing or shortness of breath. 06/05/23   Parrett, Virgel Bouquet, NP  apixaban (ELIQUIS) 5 MG TABS tablet Take 1 tablet (5 mg total) by mouth 2 (two) times daily. 05/24/23   Kalman Shan, MD  azelastine (ASTELIN) 0.1 % nasal spray Place 1 spray into both nostrils 2 (two) times daily. Use in each nostril as directed 10/24/21   Coralyn Helling, MD  benzonatate (TESSALON) 200 MG capsule Take 1 capsule (200 mg total) by mouth 3 (three) times daily as needed for cough. 04/02/23   Kalman Shan, MD  budesonide-formoterol (SYMBICORT) 80-4.5 MCG/ACT inhaler Inhale 1 puff into the lungs 2 (two) times daily. 06/10/23   Kalman Shan, MD  Cholecalciferol (DIALYVITE VITAMIN D 5000) 125 MCG (5000 UT)  capsule Take 5,000 Units by mouth daily.    [provider]  cloBAZam (ONFI) 10 MG tablet Take 1 tablet (10 mg total) by mouth 2 (two) times daily. 05/27/23   Ihor Austin, NP  diltiazem (CARDIZEM CD) 120 MG 24 hr capsule Take 1 capsule (120 mg total) by mouth daily. 11/22/22   Duke Salvia, MD  empagliflozin (JARDIANCE) 10 MG TABS tablet Take 1 tablet (10 mg total) by mouth daily. 11/20/22   Duke Salvia, MD  Evolocumab (REPATHA SURECLICK) 140 MG/ML SOAJ Inject 140 mg into the skin every 14 (fourteen) days. 09/10/22   Corky Crafts, MD  famotidine (PEPCID) 40 MG tablet TAKE 1 TABLET AT BEDTIME (NEED MD APPOINTMENT FOR REFILLS) 05/29/23   Pyrtle, Carie Caddy, MD  fexofenadine (ALLEGRA) 180 MG tablet Take 180 mg by mouth daily as needed for allergies (alternates with zyrtec sometimes).    [provider]  fluticasone (FLONASE) 50 MCG/ACT nasal spray Place 2 sprays into both nostrils daily as needed for allergies or rhinitis. 09/15/21   [name], NP  FOLIC ACID PO Take 1 tablet by mouth daily.    [provider]  furosemide (LASIX) 20 MG tablet TAKE 2 TABLETS (40 MG) 3 DAYS PER WEEK AND TAKE 1 TABLET (20 MG) THE OTHER DAYS OF THE WEEK 06/04/23   Corky Crafts, MD  hydroquinone 4 % cream Apply 1 Application topically at bedtime. 03/29/23   [provider]  hydroxypropyl methylcellulose / hypromellose (ISOPTO  TEARS / GONIOVISC) 2.5 % ophthalmic solution Place 1 drop into both eyes at bedtime.    [provider]  latanoprost (XALATAN) 0.005 % ophthalmic solution Place 1 drop into both eyes at bedtime.    [provider]  levocetirizine (XYZAL) 5 MG tablet Take 5 mg by mouth daily at 12 noon.    [provider]  levothyroxine (SYNTHROID) 50 MCG tablet TAKE 1 TABLET EVERY DAY 03/08/23   Georganna Skeans, MD  magnesium oxide (MAG-OX) 400 (240 Mg) MG tablet TAKE 1 TABLET TWICE DAILY 02/11/23   Sheilah Pigeon, PA-C  montelukast  (SINGULAIR) 10 MG tablet TAKE 1 TABLET AT BEDTIME 05/01/23   Olalere, Adewale A, MD  Multiple Vitamins-Minerals (MULTIVITAMIN PO) Take 1 tablet by mouth daily.    [provider]  pantoprazole (PROTONIX) 40 MG tablet TAKE 1 TABLET EVERY DAY 11/22/22   Pyrtle, Carie Caddy, MD  Pirfenidone 267 MG TABS Take 3 tablets (801 mg total) by mouth 3 (three) times daily with meals. 04/26/23   Kalman Shan, MD  polyethylene glycol powder (GLYCOLAX/MIRALAX) powder Take 17 grams by mouth daily 02/27/16   Pyrtle, Carie Caddy, MD  potassium chloride (KLOR-CON M) 10 MEQ tablet TAKE 1/2 TABLET EVERY DAY (CALL OFFICE TO SCHEDULE YEARLY APPT) 09/10/22   Corky Crafts, MD  promethazine-dextromethorphan (PROMETHAZINE-DM) 6.25-15 MG/5ML syrup Take 5 mLs by mouth 4 (four) times daily as needed for cough. 02/19/23   Glenford Bayley, NP  Respiratory Therapy Supplies (FLUTTER) DEVI 1 Device by Does not apply route as needed. 09/22/18   Ivonne Andrew, NP  sodium chloride HYPERTONIC 3 % nebulizer solution Take by nebulization 2 (two) times daily as needed for cough. 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 03/04/23   Lovorn, Aundra Millet, MD  traZODone (DESYREL) 100 MG tablet TAKE 1 TO 1 AND 1/2 TABLETS AT BEDTIME 12/12/22   Lovorn, Aundra Millet, MD      Allergies    Ace inhibitors, Amitriptyline hcl, Atorvastatin, Dilantin [phenytoin sodium extended], Duloxetine hcl, Paroxetine, Ramipril, Rosuvastatin, Carbamazepine, Codeine, Phenytoin sodium extended, Simvastatin, and Wellbutrin [bupropion]    Review of Systems   Review of Systems  All other systems reviewed and are negative.   Physical Exam Updated Vital Signs BP (!) 172/79   Pulse 80   Temp (!) 97.5 F (36.4 C) (Oral)   Resp 18   LMP  (LMP Unknown)   SpO2 100%  Physical Exam Vitals and nursing note reviewed.  Constitutional:      General: She is not in acute  distress.    Appearance: Normal appearance. She is well-developed.  HENT:     Head: Normocephalic and atraumatic.  Eyes:     Conjunctiva/sclera: Conjunctivae normal.     Pupils: Pupils are equal, round, and reactive to light.  Cardiovascular:     Rate and Rhythm: Normal rate and regular rhythm.     Heart sounds: Normal heart sounds.  Pulmonary:     Effort: Pulmonary effort is normal. No respiratory distress.     Breath sounds: Normal breath sounds.  Abdominal:     General: There is no distension.     Palpations: Abdomen is soft.     Tenderness: There is no abdominal tenderness.  Musculoskeletal:        General: No deformity. Normal range of motion.     Cervical back: Normal range of motion and  neck supple.  Skin:    General: Skin is warm and dry.  Neurological:     General: No focal deficit present.     Mental Status: She is alert and oriented to person, place, and time.     ED Results / Procedures / Treatments   Labs (all labs ordered are listed, but only abnormal results are displayed) Labs Reviewed  CBC  BASIC METABOLIC PANEL  BRAIN NATRIURETIC PEPTIDE  TROPONIN I (HIGH SENSITIVITY)    EKG EKG Interpretation Date/Time:  Monday June 17 2023 13:40:04 EDT Ventricular Rate:  84 PR Interval:  196 QRS Duration:  78 QT Interval:  372 QTC Calculation: 439 R Axis:   50  Text Interpretation: Normal sinus rhythm Normal ECG When compared with ECG of 02-Apr-2023 16:50, PREVIOUS ECG IS PRESENT Confirmed by Kristine Royal [phone]) on 06/17/2023 2:14:46 PM  Radiology No results found.  Procedures Procedures  {Document cardiac monitor, telemetry assessment procedure when appropriate:1}  Medications Ordered in ED Medications - No data to display  ED Course/ Medical Decision Making/ A&P   {   Click here for ABCD2, HEART and other calculatorsREFRESH Note before signing :1}                              Medical Decision Making Amount and/or Complexity of Data  Reviewed Labs: ordered. Radiology: ordered.    Medical Screen Complete  This patient presented to the ED with complaint of ***.  This complaint involves an extensive number of treatment options. The initial differential diagnosis includes, but is not limited to, ***  This presentation is: {IllnessRisk:19196::"***","Acute","Chronic","Self-Limited","Previously Undiagnosed","Uncertain Prognosis","Complicated","Systemic Symptoms","Threat to Life/Bodily Function"}    Co morbidities that complicated the patient's evaluation  ***   Additional history obtained:  Additional history obtained from {History source:19196::"EMS","Spouse","Family","Friend","Caregiver"} External records from outside sources obtained and reviewed including prior ED visits and prior Inpatient records.    Lab Tests:  I ordered and personally interpreted labs.  The pertinent results include:  ***   Imaging Studies ordered:  I ordered imaging studies including ***  I independently visualized and interpreted obtained imaging which showed *** I agree with the radiologist interpretation.   Cardiac Monitoring:  The patient was maintained on a cardiac monitor.  I personally viewed and interpreted the cardiac monitor which showed an underlying rhythm of: ***   Medicines ordered:  I ordered medication including ***  for ***  Reevaluation of the patient after these medicines showed that the patient: {resolved/improved/worsened:23923::"improved"}    Test Considered:  ***   Critical Interventions:  ***   Consultations Obtained:  I consulted ***,  and discussed lab and imaging findings as well as pertinent plan of care.    Problem List / ED Course:  ***   Reevaluation:  After the interventions noted above, I reevaluated the patient and found that they have: {resolved/improved/worsened:23923::"improved"}   Social Determinants of Health:  ***   Disposition:  After consideration of the  diagnostic results and the patients response to treatment, I feel that the patent would benefit from ***.    {Document critical care time when appropriate:1} {Document review of labs and clinical decision tools ie heart score, Chads2Vasc2 etc:1}  {Document your independent review of radiology images, and any outside records:1} {Document your discussion with family members, caretakers, and with consultants:1} {Document social determinants of health affecting pt's care:1} {Document your decision making why or why not admission, treatments were needed:1} Final Clinical Impression(s) /  ED Diagnoses Final diagnoses:  None    Rx / DC Orders ED Discharge Orders     None

## 2023-06-17 NOTE — ED Notes (Signed)
Portable xray at bedside.

## 2023-06-17 NOTE — ED Notes (Signed)
Patient updated on plan of care.  Made aware that she is waiting for CT to be completed

## 2023-06-17 NOTE — ED Provider Notes (Signed)
  Physical Exam  BP (!) 157/82 (BP Location: Right Arm)   Pulse 70   Temp 98.1 F (36.7 C) (Oral)   Resp 18   Ht 5\' 6"  (1.676 m)   Wt 100.5 kg   LMP  (LMP Unknown)   SpO2 100%   BMI 35.76 kg/m   Physical Exam Vitals and nursing note reviewed.  HENT:     Head: Normocephalic and atraumatic.  Eyes:     Pupils: Pupils are equal, round, and reactive to light.  Cardiovascular:     Rate and Rhythm: Normal rate and regular rhythm.  Pulmonary:     Effort: Pulmonary effort is normal.     Breath sounds: Normal breath sounds.  Abdominal:     Palpations: Abdomen is soft.     Tenderness: There is no abdominal tenderness.  Skin:    General: Skin is warm and dry.  Neurological:     Mental Status: She is alert.  Psychiatric:        Mood and Affect: Mood normal.     Procedures  Procedures  ED Course / MDM   Clinical Course as of 06/17/23 2113  Mon Jun 17, 2023  2108 CTA of the chest shows no pulmonary embolism.  Repeat troponin flat.  Patient reports improvement in her chest pain and feels comfortable plan for discharge home.  She will follow-up with her primary care doctor and cardiologist.  Return precautions were discussed in detail. [MP]    Clinical Course User Index [MP] Royanne Foots, DO   Medical Decision Making I, Estelle June DO, have assumed care of this patient from the previous provider CTA of the chest, delta troponin, reevaluation and disposition  Amount and/or Complexity of Data Reviewed Labs: ordered. Radiology: ordered.  Risk Prescription drug management.  Final diagnosis Chest pain History of pulmonary embolism        Royanne Foots, DO 06/17/23 2114

## 2023-06-17 NOTE — Discharge Instructions (Signed)
You were seen in the emerged part for chest pain Your blood work, chest x-ray, EKG and CT scan of the chest all looked okay You are not having a heart attack or blood clot in your lungs today We are not certain what is causing your chest pain It is important that you follow-up with your primary care doctor within 1 week for reevaluation and continue taking all previously prescribed medications Return to the emerged part for chest pain, trouble breathing or any other concerns

## 2023-06-18 ENCOUNTER — Ambulatory Visit: Payer: Self-pay | Admitting: Licensed Clinical Social Worker

## 2023-06-18 ENCOUNTER — Encounter (HOSPITAL_COMMUNITY)
Admission: RE | Admit: 2023-06-18 | Discharge: 2023-06-18 | Disposition: A | Discharge status: Home / Self Care | Payer: Medicare Other | Source: Ambulatory Visit | Attending: Internal Medicine

## 2023-06-18 VITALS — Wt 221.8 lb

## 2023-06-18 DIAGNOSIS — J84112 Idiopathic pulmonary fibrosis: Secondary | ICD-10-CM

## 2023-06-18 NOTE — Patient Instructions (Signed)
Visit Information  Thank you for taking time to visit with me today. Please don't hesitate to contact me if I can be of assistance to you.   Following are the goals we discussed today:   Goals Addressed             This Visit's Progress    Patient stated she has stress in managing medical needs.       Interventions:    Spoke with client via phone today about her current needs. She said she is going to cardiac rehab 2 times a weeks as scheduled Client and LCSW spoke of breathing status of client. She said she uses oxygen as needed to help her breathe.  Discussed sleeping issues. She said she uses C-PAP to help her sleep Discussed medication procurement of client.  Discussed walking of client. She said she is walking well. She does have low energy occasionally and has to take rest breaks Discussed family support. She has support of her son Discussed medication expenses. She said some of her medications can be expensive.   Reviewed pain issues. She spoke of back pain issues Discussed medical care of client Provided counseling support Client said she uses [employer] for oxygen equipment and supplies. She uses [employer] for C-PAP equipment and supplies LCSW thanked client for phone call with LCSW today Encouraged client to call LCSW as needed for SW support at [phone].           Our next appointment is by telephone on 07/16/23 at 3:30 PM   Please call the care guide team at [phone] if you need to cancel or reschedule your appointment.   If you are experiencing a Mental Health or Behavioral Health Crisis or need someone to talk to, please go to [facility] [address] [place] [phone])   The patient verbalized understanding of instructions, educational materials, and care plan provided today and DECLINED offer to receive copy of patient instructions, educational materials, and care plan.   The patient has been provided  with contact information for the care management team and has been advised to call with any health related questions or concerns.   Kelton Pillar.Athenia Rys MSW, LCSW Licensed Visual merchandiser [facility] Care Management [phone]

## 2023-06-18 NOTE — Patient Outreach (Signed)
Care Coordination   Follow Up Visit Note   06/18/2023 Name: [name] MRN: [id number] DOB: 09/09/1950  [name] is a 72 y.o. year old female who sees [name], NP for primary care. I spoke with  [name] by phone today.  What matters to the patients health and wellness today?  Patient has stress in managing medical needs    Goals Addressed             This Visit's Progress    Patient stated she has stress in managing medical needs.       Interventions:    Spoke with client via phone today about her current needs. She said she is going to cardiac rehab 2 times a weeks as scheduled Client and LCSW spoke of breathing status of client. She said she uses oxygen as needed to help her breathe.  Discussed sleeping issues. She said she uses C-PAP to help her sleep Discussed medication procurement of client.  Discussed walking of client. She said she is walking well. She does have low energy occasionally and has to take rest breaks Discussed family support. She has support of her son Discussed medication expenses. She said some of her medications can be expensive.   Reviewed pain issues. She spoke of back pain issues Discussed medical care of client Provided counseling support Client said she uses [employer] for oxygen equipment and supplies. She uses [employer] for C-PAP equipment and supplies LCSW thanked client for phone call with LCSW today Encouraged client to call LCSW as needed for SW support at [phone].           SDOH assessments and interventions completed:  Yes  SDOH Interventions Today    Flowsheet Row Most Recent Value  SDOH Interventions   Depression Interventions/Treatment  Counseling  Physical Activity Interventions Other (Comments)  [client is in cardiac rehab 2 times weekly as scheduled]  Stress Interventions Provide Counseling        Care Coordination Interventions:  Yes, provided  Interventions Today    Flowsheet  Row Most Recent Value  Chronic Disease   Chronic disease during today's visit Other  [spoke with client about client needs]  General Interventions   General Interventions Discussed/Reviewed General Interventions Discussed, Community Resources  Education Interventions   Education Provided Provided Education  Provided Verbal Education On [employer]  Mental Health Interventions   Mental Health Discussed/Reviewed Coping Strategies  [client said she is sad occasionally in trying to manage medical needs. She is trying to use coping skills to help her mood]  Nutrition Interventions   Nutrition Discussed/Reviewed Nutrition Discussed  Pharmacy Interventions   Pharmacy Dicussed/Reviewed Pharmacy Topics Discussed        Follow up plan: Follow up call scheduled for 07/16/23 at 3:30 PM     Encounter Outcome:  Patient Visit Completed   Kelton Pillar.Kirandeep Fariss MSW, LCSW Licensed Visual merchandiser [facility] Care Management [phone]

## 2023-06-18 NOTE — Progress Notes (Signed)
Daily Session Note  Patient Details  Name: [name] MRN: [id number] Date of Birth: March 22, 1951 Referring Provider:   Doristine Devoid Pulmonary Rehab Walk Test from 06/03/2023 in [facility]  Referring Provider Ramaswamy       Encounter Date: 06/18/2023  Check In:  Session Check In - 06/18/23 1404       Check-In   Supervising physician immediately available to respond to emergencies [facility] MD immediately available    Physician(s) Robin Searing, NP    Location [facility]    Staff Present Essie Hart, RN, BSN;Henson Fraticelli Katrinka Blazing, RT;Samantha Belarus, RD, Rexene Agent, MS, ACSM-CEP, Exercise Physiologist;Johnny Hale Bogus, MS, Exercise Physiologist    Virtual Visit No    Medication changes reported     No    Comments --    Fall or balance concerns reported    No    Tobacco Cessation No Change    Warm-up and Cool-down Performed as group-led instruction    Resistance Training Performed Yes    VAD Patient? No    PAD/SET Patient? No             Capillary Blood Glucose: Results for orders placed or performed during the hospital encounter of 06/17/23 (from the past 24 hour(s))  Troponin I (High Sensitivity)     Status: None   Collection Time: 06/17/23  3:32 PM  Result Value Ref Range   Troponin I (High Sensitivity) 8 <18 ng/L   *Note: Due to a large number of results and/or encounters for the requested time period, some results have not been displayed. A complete set of results can be found in Results Review.     Exercise Prescription Changes - 06/18/23 1500       Response to Exercise   Blood Pressure (Admit) 132/68    Blood Pressure (Exercise) 140/80    Blood Pressure (Exit) 128/76    Heart Rate (Admit) 75 bpm    Heart Rate (Exercise) 83 bpm    Heart Rate (Exit) 88 bpm    Oxygen Saturation (Admit) 95 %    Oxygen Saturation (Exercise) 94 %    Oxygen Saturation (Exit) 94 %    Rating of Perceived  Exertion (Exercise) 13    Perceived Dyspnea (Exercise) 2    Duration Continue with 30 min of aerobic exercise without signs/symptoms of physical distress.    Intensity THRR unchanged      Progression   Progression Continue to progress workloads to maintain intensity without signs/symptoms of physical distress.      Resistance Training   Training Prescription Yes    Weight red bands    Reps 10-15    Time 10 Minutes      Interval Training   Interval Training No      Oxygen   Oxygen Continuous    Liters 2      NuStep   Level 1    Minutes 15    METs 1.7      Track   Laps 8    Minutes 15    METs 2.23      Oxygen   Maintain Oxygen Saturation 88% or higher             Social History   Tobacco Use  Smoking Status Former   Current packs/day: 0.00   Average packs/day: 1 pack/day for 20.0 years (20.0 ttl pk-yrs)   Types: Cigarettes   Start date: 08/20/1969   Quit  date: 08/20/1989   Years since quitting: 33.8   Passive exposure: Past  Smokeless Tobacco Never    Goals Met:  Proper associated with RPD/PD & O2 Sat Independence with exercise equipment Exercise tolerated well No report of concerns or symptoms today Strength training completed today  Goals Unmet:  Not Applicable  Comments: Service time is from 1314 to 1441.    Dr. Mechele Collin is Medical Director for Pulmonary Rehab at [facility].

## 2023-06-19 NOTE — Progress Notes (Signed)
Remote ICD transmission.   

## 2023-06-19 NOTE — Progress Notes (Signed)
Pulmonary Individual Treatment Plan  Patient Details  Name: [name] MRN: [id number] Date of Birth: [birth year] Referring Provider:   Doristine Devoid Pulmonary Rehab Walk Test from 06/03/2023 in [facility]  Referring Provider Ramaswamy       Initial Encounter Date:  Flowsheet Row Pulmonary Rehab Walk Test from 06/03/2023 in [facility]  Date 06/03/23       Visit Diagnosis: IPF (idiopathic pulmonary fibrosis) (HCC)  Patient's Home Medications on Admission:   Current Outpatient Medications:    albuterol (VENTOLIN HFA) 108 (90 Base) MCG/ACT inhaler, Inhale 2 puffs into the lungs every 6 (six) hours as needed for wheezing or shortness of breath., Disp: 51 each, Rfl: 1   apixaban (ELIQUIS) 5 MG TABS tablet, Take 1 tablet (5 mg total) by mouth 2 (two) times daily., Disp: 180 tablet, Rfl: 1   azelastine (ASTELIN) 0.1 % nasal spray, Place 1 spray into both nostrils 2 (two) times daily. Use in each nostril as directed, Disp: 90 mL, Rfl: 3   benzonatate (TESSALON) 200 MG capsule, Take 1 capsule (200 mg total) by mouth 3 (three) times daily as needed for cough., Disp: 90 capsule, Rfl: 2   budesonide-formoterol (SYMBICORT) 80-4.5 MCG/ACT inhaler, Inhale 1 puff into the lungs 2 (two) times daily., Disp: 3 each, Rfl: 4   Cholecalciferol (DIALYVITE VITAMIN D 5000) 125 MCG (5000 UT) capsule, Take 5,000 Units by mouth daily., Disp: , Rfl:    cloBAZam (ONFI) 10 MG tablet, Take 1 tablet (10 mg total) by mouth 2 (two) times daily., Disp: 180 tablet, Rfl: 1   diltiazem (CARDIZEM CD) 120 MG 24 hr capsule, Take 1 capsule (120 mg total) by mouth daily., Disp: 90 capsule, Rfl: 3   empagliflozin (JARDIANCE) 10 MG TABS tablet, Take 1 tablet (10 mg total) by mouth daily., Disp: 90 tablet, Rfl: 3   Evolocumab (REPATHA SURECLICK) 140 MG/ML SOAJ, Inject 140 mg into the skin every 14  (fourteen) days., Disp: 6 mL, Rfl: 3   famotidine (PEPCID) 40 MG tablet, TAKE 1 TABLET AT BEDTIME (NEED MD APPOINTMENT FOR REFILLS), Disp: 90 tablet, Rfl: 3   fexofenadine (ALLEGRA) 180 MG tablet, Take 180 mg by mouth daily as needed for allergies (alternates with zyrtec sometimes)., Disp: , Rfl:    fluticasone (FLONASE) 50 MCG/ACT nasal spray, Place 2 sprays into both nostrils daily as needed for allergies or rhinitis., Disp: 48 g, Rfl: 3   FOLIC ACID PO, Take 1 tablet by mouth daily., Disp: , Rfl:    furosemide (LASIX) 20 MG tablet, TAKE 2 TABLETS (40 MG) 3 DAYS PER WEEK AND TAKE 1 TABLET (20 MG) THE OTHER DAYS OF THE WEEK, Disp: 130 tablet, Rfl: 2   hydroquinone 4 % cream, Apply 1 Application topically at bedtime., Disp: , Rfl:    hydroxypropyl methylcellulose / hypromellose (ISOPTO TEARS / GONIOVISC) 2.5 % ophthalmic solution, Place 1 drop into both eyes at bedtime., Disp: , Rfl:    latanoprost (XALATAN) 0.005 % ophthalmic solution, Place 1 drop into both eyes at bedtime., Disp: , Rfl:    levocetirizine (XYZAL) 5 MG tablet, Take 5 mg by mouth daily at 12 noon., Disp: , Rfl:    levothyroxine (SYNTHROID) 50 MCG tablet, TAKE 1 TABLET EVERY DAY, Disp: 90 tablet, Rfl: 3   magnesium oxide (MAG-OX) 400 (240 Mg) MG tablet, TAKE 1 TABLET TWICE DAILY, Disp: 180 tablet, Rfl: 3   montelukast (  SINGULAIR) 10 MG tablet, TAKE 1 TABLET AT BEDTIME, Disp: 90 tablet, Rfl: 3   Multiple Vitamins-Minerals (MULTIVITAMIN PO), Take 1 tablet by mouth daily., Disp: , Rfl:    pantoprazole (PROTONIX) 40 MG tablet, TAKE 1 TABLET EVERY DAY, Disp: 90 tablet, Rfl: 3   Pirfenidone 267 MG TABS, Take 3 tablets (801 mg total) by mouth 3 (three) times daily with meals., Disp: 270 tablet, Rfl: 4   polyethylene glycol powder (GLYCOLAX/MIRALAX) powder, Take 17 grams by mouth daily, Disp: 1080 g, Rfl: 0   potassium chloride (KLOR-CON M) 10 MEQ tablet, TAKE 1/2 TABLET EVERY DAY (CALL OFFICE TO SCHEDULE YEARLY APPT), Disp: 45 tablet, Rfl:  3   promethazine-dextromethorphan (PROMETHAZINE-DM) 6.25-15 MG/5ML syrup, Take 5 mLs by mouth 4 (four) times daily as needed for cough., Disp: 118 mL, Rfl: 0   Respiratory Therapy Supplies (FLUTTER) DEVI, 1 Device by Does not apply route as needed., Disp: 1 each, Rfl: 0   sodium chloride HYPERTONIC 3 % nebulizer solution, Take by nebulization 2 (two) times daily as needed for cough. 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, Disp: 750 mL, Rfl: 12   tiZANidine (ZANAFLEX) 4 MG tablet, Take 0.5-1 tablets (2-4 mg total) by mouth 2 (two) times daily., Disp: 180 tablet, Rfl: 1   traZODone (DESYREL) 100 MG tablet, TAKE 1 TO 1 AND 1/2 TABLETS AT BEDTIME, Disp: 135 tablet, Rfl: 3  Current Facility-Administered Medications:    lidocaine (XYLOCAINE) 1 % (with pres) injection 1 mL, 1 mL, Other, Once, Lovorn, Megan, MD   lidocaine (XYLOCAINE) 1 % (with pres) injection 3 mL, 3 mL, Other, Once, Lovorn, Megan, MD  Past Medical History: 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   Bronchiectasis (HCC)    Cerebral aneurysm, nonruptured    had a clip put in   CHF (congestive heart failure) (HCC)    Clostridium difficile infection    Depressive disorder, not elsewhere classified    Diverticulosis of colon (without mention of hemorrhage)    Esophageal reflux    Fatty liver    Fibromyalgia    Gastritis    GERD (gastroesophageal reflux disease)    GI bleed 2004   Glaucoma    Hiatal hernia    History of COVID-19 05/04/2021   Hyperlipidemia    Hypertension    Hypothyroidism    Internal hemorrhoids    Joint pain    Multifocal pneumonia 06/03/2021   Obstructive sleep apnea (adult) (pediatric)    Osteoarthritis    Ostium secundum type atrial septal defect    Other chronic nonalcoholic liver disease    Other pulmonary embolism and infarction     Palpitations    Paroxysmal ventricular tachycardia (HCC)    Personal history of radiation therapy    PONV (postoperative nausea and vomiting)    Presence of permanent cardiac pacemaker    PUD (peptic ulcer disease)    Radiation 02/03/15-03/10/15   Right Breast   Sarcoid    per pt , not sure   Schatzki's ring    Shortness of breath    Sleep apnea    Stroke [facility]) 2013   tia/ pt feels it was around 2008 0r 2009   Takotsubo syndrome    Tubular adenoma of colon    Unspecified transient cerebral ischemia    Unspecified vitamin  D deficiency     Tobacco Use: Social History   Tobacco Use  Smoking Status Former   Current packs/day: 0.00   Average packs/day: 1 pack/day for 20.0 years (20.0 ttl pk-yrs)   Types: Cigarettes   Start date: 08/20/1969   Quit date: 08/20/1989   Years since quitting: 33.8   Passive exposure: Past  Smokeless Tobacco Never    Labs: Review Flowsheet  More data exists      Latest Ref Rng & Units 12/19/2021 06/27/2022 09/07/2022 10/29/2022 11/16/2022  Labs for ITP Cardiac and Pulmonary Rehab  Cholestrol 100 - 199 mg/dL - - 401  027  253   LDL (calc) 0 - 99 mg/dL - - 664  403  474   HDL-C >39 mg/dL - - 84  63  81   Trlycerides 0 - 149 mg/dL - - 77  259  92   Hemoglobin A1c 4.8 - 5.6 % 5.7  5.3  - - -    Details            Capillary Blood Glucose: No results found for: "GLUCAP"   Pulmonary Assessment Scores:  Pulmonary Assessment Scores     Row Name 06/03/23 1352         ADL UCSD   ADL Phase Entry     SOB Score total 82       CAT Score   CAT Score 28       mMRC Score   mMRC Score 4             UCSD: Self-administered rating of dyspnea associated with activities of daily living (ADLs) 6-point scale (0 = "not at all" to 5 = "maximal or unable to do because of breathlessness")  Scoring Scores range from 0 to 120.  Minimally important difference is 5 units  CAT: CAT can identify the health impairment of COPD patients and is better  correlated with disease progression.  CAT has a scoring range of zero to 40. The CAT score is classified into four groups of low (less than 10), medium (10 - 20), high (21-30) and very high (31-40) based on the impact level of disease on health status. A CAT score over 10 suggests significant symptoms.  A worsening CAT score could be explained by an exacerbation, poor medication adherence, poor inhaler technique, or progression of COPD or comorbid conditions.  CAT MCID is 2 points  mMRC: mMRC (Modified Medical Research Council) Dyspnea Scale is used to assess the degree of baseline functional disability in patients of respiratory disease due to dyspnea. No minimal important difference is established. A decrease in score of 1 point or greater is considered a positive change.   Pulmonary Function Assessment:  Pulmonary Function Assessment - 06/03/23 1337       Breath   Bilateral Breath Sounds Rales;Basilar    Shortness of Breath Yes;Limiting activity;Panic with Shortness of Breath             Exercise Target Goals: Exercise Program Goal: Individual exercise prescription set using results from initial 6 min walk test and THRR while considering  patient's activity barriers and safety.   Exercise Prescription Goal: Initial exercise prescription builds to 30-45 minutes a day of aerobic activity, 2-3 days per week.  Home exercise guidelines will be given to patient during program as part of exercise prescription that the participant will acknowledge.  Activity Barriers & Risk Stratification:  Activity Barriers & Cardiac Risk Stratification - 06/03/23 1332  Activity Barriers & Cardiac Risk Stratification   Activity Barriers Back Problems;Left Knee Replacement;Right Knee Replacement;Deconditioning;Muscular Weakness;Shortness of Breath;Fibromyalgia    Cardiac Risk Stratification Moderate             6 Minute Walk:  6 Minute Walk     Row Name 06/03/23 1428         6 Minute  Walk   Phase Initial     Distance 790 feet     Walk Time 6 minutes     # of Rest Breaks 0     MPH 1.5     METS 1.4     RPE 13     Perceived Dyspnea  3     VO2 Peak 4.94     Symptoms Yes (comment)     Comments Pt SOB but SpO2 never lower than 89 RA     Resting HR 80 bpm     Resting BP 120/68     Resting Oxygen Saturation  97 %     Exercise Oxygen Saturation  during 6 min walk 89 %     Max Ex. HR 107 bpm     Max Ex. BP 138/70     2 Minute Post BP 130/70       Interval HR   1 Minute HR 92     2 Minute HR 100     3 Minute HR 104     4 Minute HR 107     5 Minute HR 107     6 Minute HR 107     2 Minute Post HR 90     Interval Heart Rate? Yes       Interval Oxygen   Interval Oxygen? Yes     Baseline Oxygen Saturation % 97 %     1 Minute Oxygen Saturation % 95 %     1 Minute Liters of Oxygen 0 L     2 Minute Oxygen Saturation % 94 %     2 Minute Liters of Oxygen 0 L     3 Minute Oxygen Saturation % 91 %     3 Minute Liters of Oxygen 0 L     4 Minute Oxygen Saturation % 89 %     4 Minute Liters of Oxygen 0 L     5 Minute Oxygen Saturation % 89 %     5 Minute Liters of Oxygen 0 L     6 Minute Oxygen Saturation % 90 %     6 Minute Liters of Oxygen 0 L     2 Minute Post Oxygen Saturation % 91 %     2 Minute Post Liters of Oxygen 0 L              Oxygen Initial Assessment:  Oxygen Initial Assessment - 06/03/23 1334       Home Oxygen   Home Oxygen Device Home Concentrator;E-Tanks    Sleep Oxygen Prescription CPAP    Home Exercise Oxygen Prescription Continuous    Liters per minute 2    Home Resting Oxygen Prescription None    Compliance with Home Oxygen Use Yes      Initial 6 min Walk   Oxygen Used None    Liters per minute --      Program Oxygen Prescription   Program Oxygen Prescription None    Liters per minute --      Intervention   Short Term Goals To learn and exhibit compliance with exercise, home  and travel O2 prescription;To learn and understand  importance of monitoring SPO2 with pulse oximeter and demonstrate accurate use of the pulse oximeter.;To learn and understand importance of maintaining oxygen saturations>88%;To learn and demonstrate proper pursed lip breathing techniques or other breathing techniques. ;To learn and demonstrate proper use of respiratory medications    Long  Term Goals Exhibits compliance with exercise, home  and travel O2 prescription;Maintenance of O2 saturations>88%;Compliance with respiratory medication;Verbalizes importance of monitoring SPO2 with pulse oximeter and return demonstration;Exhibits proper breathing techniques, such as pursed lip breathing or other method taught during program session;Demonstrates proper use of MDI's             Oxygen Re-Evaluation:  Oxygen Re-Evaluation     Row Name 06/14/23 1138             Program Oxygen Prescription   Program Oxygen Prescription Continuous       Liters per minute 3       Comments 3L walking, none on recumbent stepper         Home Oxygen   Home Oxygen Device Home Concentrator;E-Tanks       Sleep Oxygen Prescription CPAP       Home Exercise Oxygen Prescription Continuous       Liters per minute 2       Home Resting Oxygen Prescription None       Compliance with Home Oxygen Use Yes         Goals/Expected Outcomes   Short Term Goals To learn and exhibit compliance with exercise, home and travel O2 prescription;To learn and understand importance of monitoring SPO2 with pulse oximeter and demonstrate accurate use of the pulse oximeter.;To learn and understand importance of maintaining oxygen saturations>88%;To learn and demonstrate proper pursed lip breathing techniques or other breathing techniques. ;To learn and demonstrate proper use of respiratory medications       Long  Term Goals Exhibits compliance with exercise, home  and travel O2 prescription;Maintenance of O2 saturations>88%;Compliance with respiratory medication;Verbalizes importance of  monitoring SPO2 with pulse oximeter and return demonstration;Exhibits proper breathing techniques, such as pursed lip breathing or other method taught during program session;Demonstrates proper use of MDI's       Goals/Expected Outcomes Compliance and understanding of oxygen saturation monitoring and breathing techniques to decrease shortness of breath.                Oxygen Discharge (Final Oxygen Re-Evaluation):  Oxygen Re-Evaluation - 06/14/23 1138       Program Oxygen Prescription   Program Oxygen Prescription Continuous    Liters per minute 3    Comments 3L walking, none on recumbent stepper      Home Oxygen   Home Oxygen Device Home Concentrator;E-Tanks    Sleep Oxygen Prescription CPAP    Home Exercise Oxygen Prescription Continuous    Liters per minute 2    Home Resting Oxygen Prescription None    Compliance with Home Oxygen Use Yes      Goals/Expected Outcomes   Short Term Goals To learn and exhibit compliance with exercise, home and travel O2 prescription;To learn and understand importance of monitoring SPO2 with pulse oximeter and demonstrate accurate use of the pulse oximeter.;To learn and understand importance of maintaining oxygen saturations>88%;To learn and demonstrate proper pursed lip breathing techniques or other breathing techniques. ;To learn and demonstrate proper use of respiratory medications    Long  Term Goals Exhibits compliance with exercise, home  and travel O2 prescription;Maintenance of O2 saturations>88%;Compliance with  respiratory medication;Verbalizes importance of monitoring SPO2 with pulse oximeter and return demonstration;Exhibits proper breathing techniques, such as pursed lip breathing or other method taught during program session;Demonstrates proper use of MDI's    Goals/Expected Outcomes Compliance and understanding of oxygen saturation monitoring and breathing techniques to decrease shortness of breath.             Initial Exercise  Prescription:  Initial Exercise Prescription - 06/03/23 1400       Date of Initial Exercise RX and Referring Provider   Date 06/03/23    Referring Provider Ramaswamy    Expected Discharge Date 08/27/23      NuStep   Level 1    SPM 70    Minutes 15    METs 1.5      Track   Minutes 15    METs 1.5      Prescription Details   Frequency (times per week) 2    Duration Progress to 30 minutes of continuous aerobic without signs/symptoms of physical distress      Intensity   THRR 40-80% of Max Heartrate 58-117    Ratings of Perceived Exertion 11-13    Perceived Dyspnea 0-4      Progression   Progression Continue to progress workloads to maintain intensity without signs/symptoms of physical distress.      Resistance Training   Training Prescription Yes    Weight red bands    Reps 10-15             Perform Capillary Blood Glucose checks as needed.  Exercise Prescription Changes:   Exercise Prescription Changes     Row Name 06/18/23 1500             Response to Exercise   Blood Pressure (Admit) 132/68       Blood Pressure (Exercise) 140/80       Blood Pressure (Exit) 128/76       Heart Rate (Admit) 75 bpm       Heart Rate (Exercise) 83 bpm       Heart Rate (Exit) 88 bpm       Oxygen Saturation (Admit) 95 %       Oxygen Saturation (Exercise) 94 %       Oxygen Saturation (Exit) 94 %       Rating of Perceived Exertion (Exercise) 13       Perceived Dyspnea (Exercise) 2       Duration Continue with 30 min of aerobic exercise without signs/symptoms of physical distress.       Intensity THRR unchanged         Progression   Progression Continue to progress workloads to maintain intensity without signs/symptoms of physical distress.         Resistance Training   Training Prescription Yes       Weight red bands       Reps 10-15       Time 10 Minutes         Interval Training   Interval Training No         Oxygen   Oxygen Continuous       Liters 2          NuStep   Level 1       Minutes 15       METs 1.7         Track   Laps 8       Minutes 15       METs 2.23  Oxygen   Maintain Oxygen Saturation 88% or higher                Exercise Comments:   Exercise Comments     Row Name 06/11/23 1542           Exercise Comments Pt completed her first group exercise session. She exercised on the recumbent stepper for 15 min, level 1, METs 1.6. She then walked on the track for 15 min, METs 2.23. Her SpO2 decreased to 84 RA therefore applied 2L. She performed warm up and cool down standing, including squats. Discussed METs with good reception.                Exercise Goals and Review:   Exercise Goals     Row Name 06/03/23 1334             Exercise Goals   Increase Physical Activity Yes       Intervention Provide advice, education, support and counseling about physical activity/exercise needs.;Develop an individualized exercise prescription for aerobic and resistive training based on initial evaluation findings, risk stratification, comorbidities and participant's personal goals.       Expected Outcomes Short Term: Attend rehab on a regular basis to increase amount of physical activity.;Long Term: Add in home exercise to make exercise part of routine and to increase amount of physical activity.;Long Term: Exercising regularly at least 3-5 days a week.       Increase Strength and Stamina Yes       Intervention Provide advice, education, support and counseling about physical activity/exercise needs.;Develop an individualized exercise prescription for aerobic and resistive training based on initial evaluation findings, risk stratification, comorbidities and participant's personal goals.       Expected Outcomes Short Term: Increase workloads from initial exercise prescription for resistance, speed, and METs.;Short Term: Perform resistance training exercises routinely during rehab and add in resistance training at home;Long Term:  Improve cardiorespiratory fitness, muscular endurance and strength as measured by increased METs and functional capacity ( )       Able to understand and use rate of perceived exertion (RPE) scale Yes       Intervention Provide education and explanation on how to use RPE scale       Expected Outcomes Short Term: Able to use RPE daily in rehab to express subjective intensity level;Long Term:  Able to use RPE to guide intensity level when exercising independently       Able to understand and use Dyspnea scale Yes       Intervention Provide education and explanation on how to use Dyspnea scale       Expected Outcomes Short Term: Able to use Dyspnea scale daily in rehab to express subjective sense of shortness of breath during exertion;Long Term: Able to use Dyspnea scale to guide intensity level when exercising independently       Knowledge and understanding of Target Heart Rate Range (THRR) Yes       Intervention Provide education and explanation of THRR including how the numbers were predicted and where they are located for reference       Expected Outcomes Short Term: Able to state/look up THRR;Long Term: Able to use THRR to govern intensity when exercising independently;Short Term: Able to use daily as guideline for intensity in rehab       Understanding of Exercise Prescription Yes       Intervention Provide education, explanation, and written materials on patient's individual exercise prescription  Expected Outcomes Long Term: Able to explain home exercise prescription to exercise independently;Short Term: Able to explain program exercise prescription                Exercise Goals Re-Evaluation :  Exercise Goals Re-Evaluation     Row Name 06/14/23 1134             Exercise Goal Re-Evaluation   Exercise Goals Review Increase Physical Activity;Able to understand and use Dyspnea scale;Understanding of Exercise Prescription;Increase Strength and Stamina;Knowledge and  understanding of Target Heart Rate Range (THRR);Able to understand and use rate of perceived exertion (RPE) scale       Comments Pt has completed 2 group exercise sessions. She is exercising on the recumbent stepper for 15 min, level 1, METs 1.8. She then is walking on the track for 15 min, METs 2.23. She is needing 2-3L O2 walking.  She performed warm up and cool down standing, including squats. Will progress as tolerated.       Expected Outcomes Through exercise at rehab and home, the patient will decrease shortness of breath with daily activities and feel confident in carrying out an exercise regimen at home                Discharge Exercise Prescription (Final Exercise Prescription Changes):  Exercise Prescription Changes - 06/18/23 1500       Response to Exercise   Blood Pressure (Admit) 132/68    Blood Pressure (Exercise) 140/80    Blood Pressure (Exit) 128/76    Heart Rate (Admit) 75 bpm    Heart Rate (Exercise) 83 bpm    Heart Rate (Exit) 88 bpm    Oxygen Saturation (Admit) 95 %    Oxygen Saturation (Exercise) 94 %    Oxygen Saturation (Exit) 94 %    Rating of Perceived Exertion (Exercise) 13    Perceived Dyspnea (Exercise) 2    Duration Continue with 30 min of aerobic exercise without signs/symptoms of physical distress.    Intensity THRR unchanged      Progression   Progression Continue to progress workloads to maintain intensity without signs/symptoms of physical distress.      Resistance Training   Training Prescription Yes    Weight red bands    Reps 10-15    Time 10 Minutes      Interval Training   Interval Training No      Oxygen   Oxygen Continuous    Liters 2      NuStep   Level 1    Minutes 15    METs 1.7      Track   Laps 8    Minutes 15    METs 2.23      Oxygen   Maintain Oxygen Saturation 88% or higher             Nutrition:  Target Goals: Understanding of nutrition guidelines, daily intake of sodium 1500mg , cholesterol 200mg ,  calories 30% from fat and 7% or less from saturated fats, daily to have 5 or more servings of fruits and vegetables.  Biometrics:  Pre Biometrics - 06/03/23 1443       Pre Biometrics   Grip Strength 20 kg              Nutrition Therapy Plan and Nutrition Goals:  Nutrition Therapy & Goals - 06/11/23 1358       Nutrition Therapy   Diet Heart Healthy Diet      Personal Nutrition Goals   Nutrition  Goal Patient to improve diet quality by using the plate method as a guide for meal planning to include lean protein/plant protein, fruits, vegetables, whole grains, nonfat dairy as part of a well-balanced diet.    Comments [name] has medical history of IPF, pulmonary embolism (on eliquis), OSA, HTN.She has recently stopped taking pirfenidone over the last two weeks due to side effects of dizziness; she reports that this will be re-evaluated at follow-up in November. She continues repatha for elevated lipid panel. She reports motivation to improve eating habits by increasing fiber intake from vegetables and decreasing sweets. Patient will benefit from participation in pulmonary rehab for nutrition, exercise, and lifestyle modification.      Intervention Plan   Intervention Prescribe, educate and counsel regarding individualized specific dietary modifications aiming towards targeted core components such as weight, hypertension, lipid management, diabetes, heart failure and other comorbidities.;Nutrition handout(s) given to patient.    Expected Outcomes Short Term Goal: Understand basic principles of dietary content, such as calories, fat, sodium, cholesterol and nutrients.;Long Term Goal: Adherence to prescribed nutrition plan.             Nutrition Assessments:  Nutrition Assessments - 06/11/23 1558       Rate Your Plate Scores   Pre Score 61            MEDIFICTS Score Key: >=70 Need to make dietary changes  40-70 Heart Healthy Diet <= 40 Therapeutic Level Cholesterol  Diet  Flowsheet Row PULMONARY REHAB OTHER RESPIRATORY from 06/11/2023 in [facility]  Picture Your Plate Total Score on Admission 61      Picture Your Plate Scores: <95 Unhealthy dietary pattern with much room for improvement. 41-50 Dietary pattern unlikely to meet recommendations for good health and room for improvement. 51-60 More healthful dietary pattern, with some room for improvement.  >60 Healthy dietary pattern, although there may be some specific behaviors that could be improved.    Nutrition Goals Re-Evaluation:  Nutrition Goals Re-Evaluation     Row Name 06/11/23 1358             Goals   Current Weight 221 lb 12.5 oz (100.6 kg)       Comment cholesterol 229, LDL 132, A1c WNL       Expected Outcome [name] has medical history of IPF, pulmonary embolism (on eliquis), OSA, HTN.She has recently stopped taking pirfenidone over the last two weeks due to side effects of dizziness; she reports that this will be re-evaluated at follow-up in November. She continues repatha for elevated lipid panel. She reports motivation to improve eating habits by increasing fiber intake from vegetables and decreasing sweets. Patient will benefit from participation in pulmonary rehab for nutrition, exercise, and lifestyle modification.                Nutrition Goals Discharge (Final Nutrition Goals Re-Evaluation):  Nutrition Goals Re-Evaluation - 06/11/23 1358       Goals   Current Weight 221 lb 12.5 oz (100.6 kg)    Comment cholesterol 229, LDL 132, A1c WNL    Expected Outcome [name] has medical history of IPF, pulmonary embolism (on eliquis), OSA, HTN.She has recently stopped taking pirfenidone over the last two weeks due to side effects of dizziness; she reports that this will be re-evaluated at follow-up in November. She continues repatha for elevated lipid panel. She reports motivation to improve eating habits by increasing fiber  intake from vegetables and decreasing sweets.  Patient will benefit from participation in pulmonary rehab for nutrition, exercise, and lifestyle modification.             Psychosocial: Target Goals: Acknowledge presence or absence of significant depression and/or stress, maximize coping skills, provide positive support system. Participant is able to verbalize types and ability to use techniques and skills needed for reducing stress and depression.  Initial Review & Psychosocial Screening:  Initial Psych Review & Screening - 06/03/23 1327       Initial Review   Current issues with Current Depression;History of Depression      Family Dynamics   Good Support System? Yes      Barriers   Psychosocial barriers to participate in program Psychosocial barriers identified (see note)      Screening Interventions   Interventions Encouraged to exercise;To provide support and resources with identified psychosocial needs    Expected Outcomes Short Term goal: Utilizing psychosocial counselor, staff and physician to assist with identification of specific Stressors or current issues interfering with healing process. Setting desired goal for each stressor or current issue identified.;Long Term Goal: Stressors or current issues are controlled or eliminated.;Short Term goal: Identification and review with participant of any Quality of Life or Depression concerns found by scoring the questionnaire.;Long Term goal: The participant improves quality of Life and PHQ9 Scores as seen by post scores and/or verbalization of changes             Quality of Life Scores:  Scores of 19 and below usually indicate a poorer quality of life in these areas.  A difference of  2-3 points is a clinically meaningful difference.  A difference of 2-3 points in the total score of the Quality of Life Index has been associated with significant improvement in overall quality of life, self-image, physical symptoms, and general  health in studies assessing change in quality of life.  PHQ-9: Review Flowsheet  More data exists      06/18/2023 06/03/2023 05/07/2023 04/16/2023 03/06/2023  Depression screen PHQ 2/9  Decreased Interest 1 2 1 1 2   Down, Depressed, Hopeless 1 2 1 1  0  PHQ - 2 Score 2 4 2 2 2   Altered sleeping 1 3 1 1  0  Tired, decreased energy 1 2 1 1 2   Change in appetite 1 2 0 0 0  Feeling bad or failure about yourself  1 2 1 1 1   Trouble concentrating 1 1 1 1 2   Moving slowly or fidgety/restless 1 1 1 1  0  Suicidal thoughts 0 0 0 0 0  PHQ-9 Score 8 15 7 7 7   Difficult doing work/chores Somewhat difficult Somewhat difficult Somewhat difficult Somewhat difficult Not difficult at all    Details           Interpretation of Total Score  Total Score Depression Severity:  1-4 = Minimal depression, 5-9 = Mild depression, 10-14 = Moderate depression, 15-19 = Moderately severe depression, 20-27 = Severe depression   Psychosocial Evaluation and Intervention:  Psychosocial Evaluation - 06/03/23 1329       Psychosocial Evaluation & Interventions   Interventions Encouraged to exercise with the program and follow exercise prescription    Comments [name]'s pre PHQ9 was 15. She currently feels depressed and is frustrated becuase she does not know the cause of her IPF. She has an appointment with her primary doctor to address her current depression.    Expected Outcomes For [name] to participate in PR free of any psychosocial barriers or concerns.  Continue Psychosocial Services  Follow up required by staff             Psychosocial Re-Evaluation:  Psychosocial Re-Evaluation     Row Name 06/14/23 1224             Psychosocial Re-Evaluation   Current issues with Current Depression;History of Depression       Comments No changes since orientation. [name] has attended 2 sessions so far. We will continue to monitor and assess throughout the program.       Expected Outcomes For [name] to participate  in PR free of any psychosocial barriers or concerns.       Interventions Encouraged to attend Pulmonary Rehabilitation for the exercise       Continue Psychosocial Services  Follow up required by counselor                Psychosocial Discharge (Final Psychosocial Re-Evaluation):  Psychosocial Re-Evaluation - 06/14/23 1224       Psychosocial Re-Evaluation   Current issues with Current Depression;History of Depression    Comments No changes since orientation. [name] has attended 2 sessions so far. We will continue to monitor and assess throughout the program.    Expected Outcomes For [name] to participate in PR free of any psychosocial barriers or concerns.    Interventions Encouraged to attend Pulmonary Rehabilitation for the exercise    Continue Psychosocial Services  Follow up required by counselor             Education: Education Goals: Education classes will be provided on a weekly basis, covering required topics. Participant will state understanding/return demonstration of topics presented.  Learning Barriers/Preferences:  Learning Barriers/Preferences - 06/03/23 1329       Learning Barriers/Preferences   Learning Barriers Sight   needs cataract surgery on the left eye   Learning Preferences Written Material             Education Topics: Know Your Numbers Group instruction that is supported by a PowerPoint presentation. Instructor discusses importance of knowing and understanding resting, exercise, and post-exercise oxygen saturation, heart rate, and blood pressure. Oxygen saturation, heart rate, blood pressure, rating of perceived exertion, and dyspnea are reviewed along with a normal range for these values.    Exercise for the Pulmonary Patient Group instruction that is supported by a PowerPoint presentation. Instructor discusses benefits of exercise, core components of exercise, frequency, duration, and intensity of an exercise routine, importance of utilizing  pulse oximetry during exercise, safety while exercising, and options of places to exercise outside of rehab.    MET Level  Group instruction provided by PowerPoint, verbal discussion, and written material to support subject matter. Instructor reviews what METs are and how to increase METs.    Pulmonary Medications Verbally interactive group education provided by instructor with focus on inhaled medications and proper administration.   Anatomy and Physiology of the Respiratory System Group instruction provided by PowerPoint, verbal discussion, and written material to support subject matter. Instructor reviews respiratory cycle and anatomical components of the respiratory system and their functions. Instructor also reviews differences in obstructive and restrictive respiratory diseases with examples of each.    Oxygen Safety Group instruction provided by PowerPoint, verbal discussion, and written material to support subject matter. There is an overview of "What is Oxygen" and "Why do we need it".  Instructor also reviews how to create a safe environment for oxygen use, the importance of using oxygen as prescribed, and the risks of noncompliance. There is  a brief discussion on traveling with oxygen and resources the patient may utilize.   Oxygen Use Group instruction provided by PowerPoint, verbal discussion, and written material to discuss how supplemental oxygen is prescribed and different types of oxygen supply systems. Resources for more information are provided.    Breathing Techniques Group instruction that is supported by demonstration and informational handouts. Instructor discusses the benefits of pursed lip and diaphragmatic breathing and detailed demonstration on how to perform both.  Flowsheet Row PULMONARY REHAB OTHER RESPIRATORY from 06/13/2023 in [facility]  Date 06/13/23  Educator RN  Instruction Review Code 1-  Verbalizes Understanding        Risk Factor Reduction Group instruction that is supported by a PowerPoint presentation. Instructor discusses the definition of a risk factor, different risk factors for pulmonary disease, and how the heart and lungs work together.   Pulmonary Diseases Group instruction provided by PowerPoint, verbal discussion, and written material to support subject matter. Instructor gives an overview of the different type of pulmonary diseases. There is also a discussion on risk factors and symptoms as well as ways to manage the diseases.   Stress and Energy Conservation Group instruction provided by PowerPoint, verbal discussion, and written material to support subject matter. Instructor gives an overview of stress and the impact it can have on the body. Instructor also reviews ways to reduce stress. There is also a discussion on energy conservation and ways to conserve energy throughout the day.   Warning Signs and Symptoms Group instruction provided by PowerPoint, verbal discussion, and written material to support subject matter. Instructor reviews warning signs and symptoms of stroke, heart attack, cold and flu. Instructor also reviews ways to prevent the spread of infection.   Other Education Group or individual verbal, written, or video instructions that support the educational goals of the pulmonary rehab program.    Knowledge Questionnaire Score:  Knowledge Questionnaire Score - 06/03/23 1353       Knowledge Questionnaire Score   Pre Score 13/18             Core Components/Risk Factors/Patient Goals at Admission:  Personal Goals and Risk Factors at Admission - 06/03/23 1330       Core Components/Risk Factors/Patient Goals on Admission    Weight Management Yes;Weight Loss    Intervention Weight Management: Develop a combined nutrition and exercise program designed to reach desired caloric intake, while maintaining appropriate intake of nutrient and  fiber, sodium and fats, and appropriate energy expenditure required for the weight goal.;Weight Management: Provide education and appropriate resources to help participant work on and attain dietary goals.;Weight Management/Obesity: Establish reasonable short term and long term weight goals.;Obesity: Provide education and appropriate resources to help participant work on and attain dietary goals.    Expected Outcomes Short Term: Continue to assess and modify interventions until short term weight is achieved;Long Term: Adherence to nutrition and physical activity/exercise program aimed toward attainment of established weight goal;Weight Loss: Understanding of general recommendations for a balanced deficit meal plan, which promotes 1-2 lb weight loss per week and includes a negative energy balance of [phone] kcal/d;Understanding recommendations for meals to include 15-35% energy as protein, 25-35% energy from fat, 35-60% energy from carbohydrates, less than 200mg  of dietary cholesterol, 20-35 gm of total fiber daily;Understanding of distribution of calorie intake throughout the day with the consumption of 4-5 meals/snacks    Improve shortness of breath with ADL's Yes    Intervention Provide  education, individualized exercise plan and daily activity instruction to help decrease symptoms of SOB with activities of daily living.    Expected Outcomes Short Term: Improve cardiorespiratory fitness to achieve a reduction of symptoms when performing ADLs;Long Term: Be able to perform more ADLs without symptoms or delay the onset of symptoms             Core Components/Risk Factors/Patient Goals Review:   Goals and Risk Factor Review     Row Name 06/14/23 1227             Core Components/Risk Factors/Patient Goals Review   Personal Goals Review Weight Management/Obesity;Improve shortness of breath with ADL's;Develop more efficient breathing techniques such as purse lipped breathing and diaphragmatic  breathing and practicing self-pacing with activity.       Review Unable to assess goals yet. [name] has attended 2 sessions so far. We will continue to monitor her progress throughout the program.       Expected Outcomes For [name] to lose weight, improve shortness of breath with ADLs, and develop more efficient breathing techniques such as purse lipped breathing and diaphragmatic breathing; and practicing self-pacing with activity                Core Components/Risk Factors/Patient Goals at Discharge (Final Review):   Goals and Risk Factor Review - 06/14/23 1227       Core Components/Risk Factors/Patient Goals Review   Personal Goals Review Weight Management/Obesity;Improve shortness of breath with ADL's;Develop more efficient breathing techniques such as purse lipped breathing and diaphragmatic breathing and practicing self-pacing with activity.    Review Unable to assess goals yet. [name] has attended 2 sessions so far. We will continue to monitor her progress throughout the program.    Expected Outcomes For [name] to lose weight, improve shortness of breath with ADLs, and develop more efficient breathing techniques such as purse lipped breathing and diaphragmatic breathing; and practicing self-pacing with activity             ITP Comments:   Comments: Pt is making expected progress toward Pulmonary Rehab goals after completing 3 session(s). Recommend continued exercise, life style modification, education, and utilization of breathing techniques to increase stamina and strength, while also decreasing shortness of breath with exertion.  Dr. Mechele Collin is Medical Director for Pulmonary Rehab at [facility].

## 2023-06-20 ENCOUNTER — Encounter (HOSPITAL_COMMUNITY)
Admission: RE | Admit: 2023-06-20 | Discharge: 2023-06-20 | Disposition: A | Discharge status: Home / Self Care | Payer: Medicare Other | Source: Ambulatory Visit | Attending: Internal Medicine | Admitting: Internal Medicine

## 2023-06-20 DIAGNOSIS — J84112 Idiopathic pulmonary fibrosis: Secondary | ICD-10-CM

## 2023-06-20 NOTE — Progress Notes (Signed)
Daily Session Note  Patient Details  Name: [name] MRN: [id number] Date of Birth: 09-May-1951 Referring Provider:   Doristine Devoid Pulmonary Rehab Walk Test from 06/03/2023 in [facility]  Referring Provider Ramaswamy       Encounter Date: 06/20/2023  Check In:  Session Check In - 06/20/23 1329       Check-In   Supervising physician immediately available to respond to emergencies [facility] MD immediately available    Physician(s) Carlyon Shadow, NP    Location [facility]    Staff Present Essie Hart, RN, BSN;Casey Katrinka Blazing, RT;Samantha Belarus, RD, Rexene Agent, MS, ACSM-CEP, Exercise Physiologist;Johnny Hale Bogus, MS, Exercise Physiologist    Virtual Visit No    Medication changes reported     No    Fall or balance concerns reported    No    Tobacco Cessation No Change    Warm-up and Cool-down Performed as group-led instruction    Resistance Training Performed Yes    VAD Patient? No    PAD/SET Patient? No      Pain Assessment   Currently in Pain? No/denies    Pain Score 0-No pain    Multiple Pain Sites No             Capillary Blood Glucose: No results found. However, due to the size of the patient record, not all encounters were searched. Please check Results Review for a complete set of results.    Social History   Tobacco Use  Smoking Status Former   Current packs/day: 0.00   Average packs/day: 1 pack/day for 20.0 years (20.0 ttl pk-yrs)   Types: Cigarettes   Start date: 08/20/1969   Quit date: 08/20/1989   Years since quitting: 33.8   Passive exposure: Past  Smokeless Tobacco Never    Goals Met:  Independence with exercise equipment Exercise tolerated well No report of concerns or symptoms today Strength training completed today  Goals Unmet:  Not Applicable  Comments: Service time is from 1317 to 1435    Dr. Mechele Collin is Medical Director for Pulmonary Rehab at  [facility].

## 2023-06-21 ENCOUNTER — Encounter: Payer: Self-pay | Admitting: Adult Health

## 2023-06-21 ENCOUNTER — Telehealth: Payer: Medicare Other | Admitting: Adult Health

## 2023-06-21 DIAGNOSIS — R42 Dizziness and giddiness: Secondary | ICD-10-CM | POA: Diagnosis not present

## 2023-06-21 DIAGNOSIS — G4733 Obstructive sleep apnea (adult) (pediatric): Secondary | ICD-10-CM

## 2023-06-21 DIAGNOSIS — J84112 Idiopathic pulmonary fibrosis: Secondary | ICD-10-CM

## 2023-06-21 DIAGNOSIS — J479 Bronchiectasis, uncomplicated: Secondary | ICD-10-CM | POA: Diagnosis not present

## 2023-06-21 MED ORDER — BUDESONIDE-FORMOTEROL FUMARATE 80-4.5 MCG/ACT IN AERO: 1.0000 | INHALATION_SPRAY | Freq: Two times a day (BID) | RESPIRATORY_TRACT | 4 refills | Status: DC

## 2023-06-21 NOTE — Progress Notes (Signed)
Virtual Visit via Video Note  I connected with [name] on 06/21/23 at  1:30 PM EDT by a video enabled telemedicine application and verified that I am speaking with the correct person using two identifiers.  Location: Patient: Home  Provider: Office    I discussed the limitations of evaluation and management by telemedicine and the availability of in person appointments. The patient expressed understanding and agreed to proceed.  History of Present Illness: 72 year old female former smoker followed for bronchiectasis, interstitial lung disease/IPF and chronic respiratory failure and sleep apnea Medical history significant for nonischemic cardiomyopathy  Today's video visit is 1 month follow-up.  She is followed for IPF.  She has been started on Esbriet but during last visit was having dizziness felt possibly secondary to Esbriet.  Esbriet was placed on hold until October 12.  She was to restart Esbriet and titrate up slowly as tolerable.  She was also referred to pulmonary rehab.  She would also recommend to continue on oxygen 2 L with activity  Discussed the use of AI scribe software for clinical note transcription with the patient, who gave verbal consent to proceed.  History of Present Illness   Today's video visit is 1 month follow-up.  She is followed for IPF.  She has been started on Esbriet but during last visit was having dizziness felt possibly secondary to Esbriet.  Esbriet was placed on hold until October 12.  She was to restart Esbriet and titrate up slowly as tolerable.  She was also referred to pulmonary rehab.  She would also recommend to continue on oxygen 2 L with activity The patient had previously been advised to stop taking Esbriet due to concerns it was causing dizziness. The patient confirmed that the dizziness resolved after discontinuing the medication, but was unaware of the instruction to restart the medication on October 13th. The patient had previously reached  the top dose of Esbriet (three capsules three times daily) . Dizziness did start at lower doses.   In addition to fibrosis, the patient was also dealing with issues related to Pradaxa, a blood thinner, which was causing throat burning. The patient confirmed that switching to Eliquis resolved this issue.  She denies any known bleeding . Education on not using NSAIDS.   The patient is using oxygen with activity and during physical therapy, as her oxygen level sometimes dropped to as low as 88. The patient is taking Symbicort twice daily and using albuterol infrequently. The patient was not very active outside of physical therapy and housework. Likes pulmonary rehab alot.   The patient is  also using a CPAP machine at bedtime and reported feeling rested with its use. The patient had recently been in the ER for chest pain . Workup was unrevealing with neg CT chest for PE or acute process. Labs unrevealing. plans to follow up with cardiology .    The patient had received a pneumonia vaccine in 2022 and was up to date on RSV vaccines.             Observations/Objective: TEST/EVENTS :  PFT 04/17/12 >> FEV1 2.60 (110%), FEV1% 85, TLC 4.05 (77%), DLCO 44% PFT 01/17/17 >> FEV1 2.38 (111%), FEV1% 90, TLC 3.85 (71%), DLCO 58%, no BD RAST 05/30/17 >> dust mites PFT 10/16/18 >> FEV1 2.27 (111%), FEV1% 90, TLC 4.00 (74%), DLCO 59% PFT 06/08/22 >> FEV1 2.20 (91%), FEV1% 93, TLC 3.76 (69%), DLCO 62%   Serology:  04/02/12 >> ACE 1 10/25/15 >> ESR 40, ACE  32 05/30/17 >> IgA, IgG, IgM normal 10/16/18 >> HP panel negative, ACE 25   Chest Imaging:  CT chest 04/10/12 >> b/l lower lobe cylindrical BTX and some in upper lobes CT angio chest 10/26/15 >> patchy GGO in periphery of lungs b/l, basilar BTX, no PE; no significant change compared to 2013 HRCT chest 10/08/18 >> atherosclerosis, basilar predominant cylindrical BTX, mild centrilobular/paraseptal emphysema, air trapping CT angio chest 03/24/19 >> Chronic lung  changes with peribronchial thickening, bibasilar atelectasis and chronic interstitial disease/peripheral fibrosis at the lung bases CT angio chest 06/03/21 >> scattered GGO with fibrotic changes CT angio chest 08/30/21 >> BTX with emphysema and fibrotic changes in periphery HRCT 06/2022 -Progressive ILD-dx of UIP    Sleep Tests:  PSG 11/29/08 >> AHI 9 HST 11/10/15 >> AHI 7.1, SaO2 low 75% CPAP 06/17/21 to 07/16/21 >> used on 27 of 30 nights with average 5 hrs 24 min.  Average AHI 2.5 with CPAP 5 cm H2O.   Cardiac Tests:  Echo 08/11/21 >> EF 50 to 55%, RVSP 39.5 mmHg  Assessment and Plan: Assessment and Plan    Idiopathic Pulmonary Fibrosis   She has idiopathic pulmonary fibrosis and experienced dizziness with Esbriet, which resolved after stopping the medication. We will restart Esbriet at 267 mg once capsule three times daily for one week, then increase to two capsules three times daily for 1 week,  and finally to three capsules three times daily if tolerated , monitoring for dizziness or other side effects. She should contact the clinic if symptoms occur.  Plan  Patient Instructions  Mucinex As needed  cough/congestion  Flutter valve Twice daily   Albuterol inhaler or neb As needed   Symbicort 2 puffs Twice daily  , rinse after use.  Continue on CPAP At bedtime    Continue with Pulmonary rehab .  Continue on Oxygen 2l/m with activity, to keep O2 sats >88-90%.  Restart Esbriet 1 capsule 3 times daily for 1 week then 2 capsules 3 times daily for 1 week then 3 capsules 3 times daily as tolerated please call back if dizziness returns or develops additional side effects. Follow up with Dr. Wynona Neat as planned this month and As needed   Please contact office for sooner follow up if symptoms do not improve or worsen or seek emergency care      Chronic Obstructive Pulmonary Disease (COPD)  /Bronchiectasis  She has COPD and is on Symbicort twice daily, albuterol as needed, She reports  increased activity tolerance and stamina from pulmonary rehab. We will continue Symbicort, albuterol, oxygen at 2 liters with activity,. Symbicort will be refilled through the mail order pharmacy.  Anticoagulation Management   She switched from Pradaxa to Eliquis due to throat burning, which has resolved. She is aware of the need to avoid NSAIDs to reduce bleeding risk and the importance of reporting any signs of bleeding. We will continue Eliquis and advise avoiding NSAIDs such as ibuprofen, Aleve, and Excedrin.  General Health Maintenance   She is up to date on flu and RSV vaccines, due for Tdap vaccine, and has not received the COVID-19 vaccine due to concerns about blood clots. We discussed the importance of good hand hygiene, mask-wearing in crowds, and good nutrition for immune support. She should check with her primary care provider or pharmacy for the Tdap vaccine.   OSA-continue on CPAP at bedtime    Chronic respiratory Failure -continue on O2 with activity to keep O2 sats >88-90%  Follow Up Instructions:    I discussed the assessment and treatment plan with the patient. The patient was provided an opportunity to ask questions and all were answered. The patient agreed with the plan and demonstrated an understanding of the instructions.   The patient was advised to call back or seek an in-person evaluation if the symptoms worsen or if the condition fails to improve as anticipated.  I provided 30  minutes of non-face-to-face time during this encounter.   Rubye Oaks, NP

## 2023-06-21 NOTE — Patient Instructions (Addendum)
Mucinex As needed  cough/congestion  Flutter valve Twice daily   Albuterol inhaler or neb As needed   Symbicort 2 puffs Twice daily  , rinse after use.  Continue on CPAP At bedtime    Continue with Pulmonary rehab .  Continue on Oxygen 2l/m with activity, to keep O2 sats >88-90%.  Restart Esbriet 1 capsule 3 times daily for 1 week then 2 capsules 3 times daily for 1 week then 3 capsules 3 times daily as tolerated please call back if dizziness returns or develops additional side effects. Follow up with Dr. Wynona Neat as planned this month and As needed   Please contact office for sooner follow up if symptoms do not improve or worsen or seek emergency care

## 2023-06-25 ENCOUNTER — Encounter (HOSPITAL_COMMUNITY)
Admission: RE | Admit: 2023-06-25 | Discharge: 2023-06-25 | Disposition: A | Discharge status: Home / Self Care | Payer: Medicare Other | Source: Ambulatory Visit | Attending: Internal Medicine | Admitting: Internal Medicine

## 2023-06-25 ENCOUNTER — Other Ambulatory Visit: Payer: Self-pay

## 2023-06-25 DIAGNOSIS — J84112 Idiopathic pulmonary fibrosis: Secondary | ICD-10-CM | POA: Insufficient documentation

## 2023-06-25 NOTE — Progress Notes (Signed)
Daily Session Note  Patient Details  Name: [name] MRN: [id number] Date of Birth: 03-26-1951 Referring Provider:   Doristine Devoid Pulmonary Rehab Walk Test from 06/03/2023 in [facility] for Heart, Vascular, & Lung Health  Referring Provider Ramaswamy       Encounter Date: 06/25/2023  Check In:  Session Check In - 06/25/23 1410       Check-In   Supervising physician immediately available to respond to emergencies [facility] MD immediately available    Physician(s) Carlyon Shadow, NP    Location [facility]    Staff Present Essie Hart, RN, BSN;Casey Katrinka Blazing, RT;Samantha Belarus, RD, Rexene Agent, MS, ACSM-CEP, Exercise Physiologist;Cielle Aguila Dionisio Paschal, ACSM-CEP, Exercise Physiologist    Virtual Visit No    Medication changes reported     Yes   started Esbriet again, x3 qd   Fall or balance concerns reported    No    Tobacco Cessation No Change    Warm-up and Cool-down Performed as group-led instruction    Resistance Training Performed Yes    VAD Patient? No    PAD/SET Patient? No      Pain Assessment   Currently in Pain? No/denies    Multiple Pain Sites No             Capillary Blood Glucose: No results found. However, due to the size of the patient record, not all encounters were searched. Please check Results Review for a complete set of results.    Social History   Tobacco Use  Smoking Status Former   Current packs/day: 0.00   Average packs/day: 1 pack/day for 20.0 years (20.0 ttl pk-yrs)   Types: Cigarettes   Start date: 08/20/1969   Quit date: 08/20/1989   Years since quitting: 33.8   Passive exposure: Past  Smokeless Tobacco Never    Goals Met:  Exercise tolerated well No report of concerns or symptoms today Strength training completed today  Goals Unmet:  Not Applicable  Comments: Pt needed to change from track to recumbent elliptical due to hip pain. Service time is from 1322 to 1442.    Dr. Mechele Collin is Medical Director for Pulmonary Rehab at [facility].

## 2023-06-25 NOTE — Progress Notes (Signed)
Specialty Pharmacy Refill Coordination Note  [name] is a 72 y.o. female contacted today regarding refills of specialty medication(s) Pirfenidone   Patient requested Delivery   Delivery date: 07/04/23   Verified address: [address], [place], [address]   Medication will be filled on 11/13.

## 2023-06-27 ENCOUNTER — Encounter (HOSPITAL_COMMUNITY)
Admission: RE | Admit: 2023-06-27 | Discharge: 2023-06-27 | Disposition: A | Discharge status: Home / Self Care | Payer: Medicare Other | Source: Ambulatory Visit | Attending: Internal Medicine | Admitting: Internal Medicine

## 2023-06-27 DIAGNOSIS — J84112 Idiopathic pulmonary fibrosis: Secondary | ICD-10-CM

## 2023-06-27 NOTE — Progress Notes (Signed)
Daily Session Note  Patient Details  Name: [name] MRN: [id number] Date of Birth: May 23, 1951 Referring Provider:   Doristine Devoid Pulmonary Rehab Walk Test from 06/03/2023 in [facility] for [facility], Vascular, & Lung Health  Referring Provider Ramaswamy       Encounter Date: 06/27/2023  Check In:  Session Check In - 06/27/23 1329       Check-In   Supervising physician immediately available to respond to emergencies [facility] immediately available    Physician(s) Robin Searing, NP    [facility]    Staff Present Essie Hart, RN, BSN;Casey Katrinka Blazing, Zella Richer, MS, ACSM-CEP, Exercise Physiologist;Randi [facility], ACSM-CEP, Exercise Physiologist    Virtual Visit No    Medication changes reported     No   started Esbriet again, x3 qd   Fall or balance concerns reported    No    Tobacco Cessation No Change    Warm-up and Cool-down Performed as group-led instruction    Resistance Training Performed Yes    VAD Patient? No    PAD/SET Patient? No      Pain Assessment   Currently in Pain? No/denies    Multiple Pain Sites No             Capillary Blood Glucose: No results found. However, due to the size of the patient record, not all encounters were searched. Please check Results Review for a complete set of results.    Social History   Tobacco Use  Smoking Status Former   Current packs/day: 0.00   Average packs/day: 1 pack/day for 20.0 years (20.0 ttl pk-yrs)   Types: Cigarettes   Start date: 08/20/1969   Quit date: 08/20/1989   Years since quitting: 33.8   Passive exposure: Past  Smokeless Tobacco Never    Goals Met:  Proper associated with RPD/PD & O2 Sat Exercise tolerated well No report of concerns or symptoms today Strength training completed today  Goals Unmet:  Not Applicable  Comments: Service time is from 1325 to 1440.    Dr. Mechele Collin is Medical Director for Pulmonary Rehab at [facility].

## 2023-06-29 ENCOUNTER — Other Ambulatory Visit: Payer: Self-pay | Admitting: Interventional Cardiology

## 2023-07-02 ENCOUNTER — Ambulatory Visit: Payer: Medicare Other | Admitting: Family Medicine

## 2023-07-02 ENCOUNTER — Encounter (HOSPITAL_COMMUNITY)
Admission: RE | Admit: 2023-07-02 | Discharge: 2023-07-02 | Disposition: A | Discharge status: Home / Self Care | Payer: Medicare Other | Source: Ambulatory Visit | Attending: Internal Medicine

## 2023-07-02 VITALS — Wt 219.8 lb

## 2023-07-02 DIAGNOSIS — J84112 Idiopathic pulmonary fibrosis: Secondary | ICD-10-CM

## 2023-07-02 NOTE — Progress Notes (Signed)
Daily Session Note  Patient Details  Name: [name] MRN: [id number] Date of Birth: 07-31-1951 Referring Provider:   Doristine Devoid Pulmonary Rehab Walk Test from 06/03/2023 in [facility] for Heart, Vascular, & Lung Health  Referring Provider Ramaswamy       Encounter Date: 07/02/2023  Check In:  Session Check In - 07/02/23 1324       Check-In   Supervising physician immediately available to respond to emergencies [facility] immediately available    Physician(s) Lorin Picket, NP    [facility]    Staff Present Essie Hart, RN, BSN;Ephram Kornegay Katrinka Blazing, Zella Richer, MS, ACSM-CEP, Exercise Physiologist;Randi Dionisio Paschal, ACSM-CEP, Exercise Physiologist    Virtual Visit No    Medication changes reported     No   started Esbriet again, x3 qd   Fall or balance concerns reported    No    Tobacco Cessation No Change    Warm-up and Cool-down Performed as group-led instruction    Resistance Training Performed Yes    VAD Patient? No    PAD/SET Patient? No      Pain Assessment   Currently in Pain? No/denies    Multiple Pain Sites No             Capillary Blood Glucose: No results found. However, due to the size of the patient record, not all encounters were searched. Please check Results Review for a complete set of results.   Exercise Prescription Changes - 07/02/23 1500       Response to Exercise   Blood Pressure (Admit) 126/68    Blood Pressure (Exercise) 138/72    Blood Pressure (Exit) 112/66    Heart Rate (Admit) 97 bpm    Heart Rate (Exercise) 102 bpm    Heart Rate (Exit) 98 bpm    Oxygen Saturation (Admit) 92 %    Oxygen Saturation (Exercise) 96 %    Oxygen Saturation (Exit) 93 %    Rating of Perceived Exertion (Exercise) 13    Perceived Dyspnea (Exercise) 2    Duration Continue with 30 min of aerobic exercise without signs/symptoms of physical distress.    Intensity THRR unchanged      Progression    Progression Continue to progress workloads to maintain intensity without signs/symptoms of physical distress.      Resistance Training   Training Prescription Yes    Weight red bands    Reps 10-15    Time 10 Minutes      Oxygen   Oxygen Continuous    Liters 2      NuStep   Level 3    Minutes 15    METs 2.1      Recumbant Elliptical   Level 2    Minutes 15    METs 2.6      Oxygen   Maintain Oxygen Saturation 88% or higher             Social History   Tobacco Use  Smoking Status Former   Current packs/day: 0.00   Average packs/day: 1 pack/day for 20.0 years (20.0 ttl pk-yrs)   Types: Cigarettes   Start date: 08/20/1969   Quit date: 08/20/1989   Years since quitting: 33.8   Passive exposure: Past  Smokeless Tobacco Never    Goals Met:  Proper associated with RPD/PD & O2 Sat Independence with exercise equipment Exercise tolerated well No report of concerns or symptoms today Strength training completed today  Goals Unmet:  Not Applicable  Comments: Service time is from 1310 to 1430.    Dr. Mechele Collin is Medical Director for Pulmonary Rehab at [facility].

## 2023-07-03 ENCOUNTER — Other Ambulatory Visit: Payer: Self-pay

## 2023-07-04 ENCOUNTER — Encounter (HOSPITAL_COMMUNITY)
Admission: RE | Admit: 2023-07-04 | Discharge: 2023-07-04 | Disposition: A | Discharge status: Home / Self Care | Payer: Medicare Other | Source: Ambulatory Visit | Attending: Internal Medicine | Admitting: Internal Medicine

## 2023-07-04 DIAGNOSIS — J84112 Idiopathic pulmonary fibrosis: Secondary | ICD-10-CM | POA: Diagnosis not present

## 2023-07-04 NOTE — Progress Notes (Signed)
Daily Session Note  Patient Details  Name: [name] MRN: [id number] Date of Birth: 1951-01-15 Referring Provider:   Doristine Devoid Pulmonary Rehab Walk Test from 06/03/2023 in [facility] for Heart, Vascular, & Lung Health  Referring Provider Ramaswamy       Encounter Date: 07/04/2023  Check In:  Session Check In - 07/04/23 1338       Check-In   Supervising physician immediately available to respond to emergencies [facility] immediately available    Physician(s) Bernadene Person, NP    [facility]    Staff Present Durel Salts, Zella Richer, MS, ACSM-CEP, Exercise Physiologist;Randi Dionisio Paschal, ACSM-CEP, Exercise Physiologist    Virtual Visit No    Medication changes reported     No   started Esbriet again, x3 qd   Fall or balance concerns reported    No    Tobacco Cessation No Change    Warm-up and Cool-down Performed as group-led instruction    Resistance Training Performed Yes    VAD Patient? No    PAD/SET Patient? No      Pain Assessment   Currently in Pain? No/denies    Pain Score 0-No pain    Multiple Pain Sites No             Capillary Blood Glucose: No results found. However, due to the size of the patient record, not all encounters were searched. Please check Results Review for a complete set of results.    Social History   Tobacco Use  Smoking Status Former   Current packs/day: 0.00   Average packs/day: 1 pack/day for 20.0 years (20.0 ttl pk-yrs)   Types: Cigarettes   Start date: 08/20/1969   Quit date: 08/20/1989   Years since quitting: 33.8   Passive exposure: Past  Smokeless Tobacco Never    Goals Met:  Independence with exercise equipment Exercise tolerated well No report of concerns or symptoms today Strength training completed today  Goals Unmet:  Not Applicable  Comments: Service time is from 1314 to 1442    Dr. Mechele Collin is Medical Director for Pulmonary Rehab at [facility].

## 2023-07-09 ENCOUNTER — Ambulatory Visit (HOSPITAL_BASED_OUTPATIENT_CLINIC_OR_DEPARTMENT_OTHER): Payer: Medicare Other | Attending: Primary Care | Admitting: Internal Medicine

## 2023-07-09 ENCOUNTER — Encounter (HOSPITAL_COMMUNITY)
Admission: RE | Admit: 2023-07-09 | Discharge: 2023-07-09 | Disposition: A | Discharge status: Home / Self Care | Payer: Medicare Other | Source: Ambulatory Visit | Attending: Internal Medicine

## 2023-07-09 VITALS — Ht 66.5 in | Wt 219.0 lb

## 2023-07-09 DIAGNOSIS — J84112 Idiopathic pulmonary fibrosis: Secondary | ICD-10-CM

## 2023-07-09 DIAGNOSIS — G4733 Obstructive sleep apnea (adult) (pediatric): Secondary | ICD-10-CM | POA: Insufficient documentation

## 2023-07-09 NOTE — Progress Notes (Signed)
Daily Session Note  Patient Details  Name: [name] MRN: [id number] Date of Birth: April 05, 1951 Referring Provider:   Doristine Devoid Pulmonary Rehab Walk Test from 06/03/2023 in [facility] for Heart, Vascular, & Lung Health  Referring Provider Ramaswamy       Encounter Date: 07/09/2023  Check In:  Session Check In - 07/09/23 1325       Check-In   Supervising physician immediately available to respond to emergencies [facility] immediately available    Physician(s) Bernadene Person, NP    [facility]    Staff Present Durel Salts, Zella Richer, MS, ACSM-CEP, Exercise Physiologist;Dare Sanger Dionisio Paschal, ACSM-CEP, Exercise Physiologist;Samantha Belarus, RD, Dutch Gray, RN, BSN    Virtual Visit No    Medication changes reported     No   started Esbriet again, x3 qd   Fall or balance concerns reported    No    Tobacco Cessation No Change    Warm-up and Cool-down Performed as group-led instruction    Resistance Training Performed Yes    VAD Patient? No    PAD/SET Patient? No      Pain Assessment   Currently in Pain? No/denies    Multiple Pain Sites No             Capillary Blood Glucose: No results found. However, due to the size of the patient record, not all encounters were searched. Please check Results Review for a complete set of results.    Social History   Tobacco Use  Smoking Status Former   Current packs/day: 0.00   Average packs/day: 1 pack/day for 20.0 years (20.0 ttl pk-yrs)   Types: Cigarettes   Start date: 08/20/1969   Quit date: 08/20/1989   Years since quitting: 33.9   Passive exposure: Past  Smokeless Tobacco Never    Goals Met:  Independence with exercise equipment Exercise tolerated well No report of concerns or symptoms today Strength training completed today  Goals Unmet:  Not Applicable  Comments: Service time is from 1320 to 1444.    Dr. Mechele Collin is Medical Director for Pulmonary  Rehab at [facility].

## 2023-07-11 ENCOUNTER — Encounter (HOSPITAL_COMMUNITY)
Admission: RE | Admit: 2023-07-11 | Discharge: 2023-07-11 | Disposition: A | Discharge status: Home / Self Care | Payer: Medicare Other | Source: Ambulatory Visit | Attending: Internal Medicine | Admitting: Internal Medicine

## 2023-07-11 VITALS — Wt 218.0 lb

## 2023-07-11 DIAGNOSIS — J84112 Idiopathic pulmonary fibrosis: Secondary | ICD-10-CM | POA: Diagnosis not present

## 2023-07-11 NOTE — Progress Notes (Signed)
Daily Session Note  Patient Details  Name: [name] MRN: [id number] Date of Birth: [birth year] Referring Provider:   Doristine Devoid Pulmonary Rehab Walk Test from 06/03/2023 in [facility] for Heart, Vascular, & Lung Health  Referring Provider Ramaswamy       Encounter Date: 07/11/2023  Check In:  Session Check In - 07/11/23 1327       Check-In   Supervising physician immediately available to respond to emergencies [facility] immediately available    Physician(s) Reather Littler, NP    [facility]    Staff Present Durel Salts, Zella Richer, MS, ACSM-CEP, Exercise Physiologist;Randi Idelle Crouch BS, ACSM-CEP, Exercise Physiologist;Samantha Belarus, RD, Dutch Gray, RN, BSN    Virtual Visit No    Medication changes reported     No   started Esbriet again, x3 qd   Fall or balance concerns reported    No    Tobacco Cessation No Change    Warm-up and Cool-down Performed as group-led instruction    Resistance Training Performed Yes    VAD Patient? No    PAD/SET Patient? No      Pain Assessment   Currently in Pain? No/denies    Multiple Pain Sites No             Capillary Blood Glucose: No results found. However, due to the size of the patient record, not all encounters were searched. Please check Results Review for a complete set of results.    Social History   Tobacco Use  Smoking Status Former   Current packs/day: 0.00   Average packs/day: 1 pack/day for 20.0 years (20.0 ttl pk-yrs)   Types: Cigarettes   Start date: 08/20/1969   Quit date: 08/20/1989   Years since quitting: 33.9   Passive exposure: Past  Smokeless Tobacco Never    Goals Met:  Proper associated with RPD/PD & O2 Sat Independence with exercise equipment Exercise tolerated well No report of concerns or symptoms today Strength training completed today  Goals Unmet:  Not Applicable  Comments: Service time is from 1310 to 1437.    Dr. Mechele Collin is Medical Director for Pulmonary Rehab at [facility].

## 2023-07-14 DIAGNOSIS — G4733 Obstructive sleep apnea (adult) (pediatric): Secondary | ICD-10-CM | POA: Diagnosis not present

## 2023-07-14 NOTE — Procedures (Signed)
Patient Name: [name], [name] Date: 07/09/2023 Gender: Female D.O.B: 1950/09/12 Age (years): 59 Referring Provider: Ames Dura NP Height (inches): 67 Interpreting Physician: Jetty Duhamel MD, ABSM Weight (lbs): 219 RPSGT: Shelah Lewandowsky BMI: 35 MRN: [id number] Neck Size: 14.00  CLINICAL INFORMATION The patient is referred for a CPAP titration to treat sleep apnea.  Date of NPSG, Split Night or HST:  HST 06/01/16  AHI 10.7/hr, desaturation to 82%, body weight 203 lbs  SLEEP STUDY TECHNIQUE As per the AASM Manual for the Scoring of Sleep and Associated Events v2.3 (April 2016) with a hypopnea requiring 4% desaturations.  The channels recorded and monitored were frontal, central and occipital EEG, electrooculogram (EOG), submentalis EMG (chin), nasal and oral airflow, thoracic and abdominal wall motion, anterior tibialis EMG, snore microphone, electrocardiogram, and pulse oximetry. Continuous positive airway pressure (CPAP) was initiated at the beginning of the study and titrated to treat sleep-disordered breathing.  MEDICATIONS Medications self-administered by patient taken the night of the study : ALBUTEROL, BENZONATATE, CLOBAZAM, ELIQUIS, FAMOTIDINE, ISOPTO TEARS/ GONIOVISC, LATANOPROST, MAGNESIUM OXIDE, MONTELUKAST, Pirfenidone, SYMBICORT, TRAZODONE  TECHNICIAN COMMENTS Comments added by technician: O2 initiated due to low sats. Comments added by scorer: N/A RESPIRATORY PARAMETERS Optimal PAP Pressure (cm): 11 AHI at Optimal Pressure (/hr): 3.3 Overall Minimal O2 (%): 75.0 Supine % at Optimal Pressure (%): 100 Minimal O2 at Optimal Pressure (%): 87.0   SLEEP ARCHITECTURE The study was initiated at 11:16:53 PM and ended at 5:28:38 AM.  Sleep onset time was 62.9 minutes and the sleep efficiency was 71.7%. The total sleep time was 266.5 minutes.  The patient spent 5.3% of the night in stage N1 sleep, 66.0% in stage N2 sleep, 0.0% in stage N3 and 28.7% in  REM.Stage REM latency was 206.5 minutes  Wake after sleep onset was 42.3. Alpha intrusion was absent. Supine sleep was 67.55%.  CARDIAC DATA The 2 lead EKG demonstrated sinus rhythm. The mean heart rate was 64.4 beats per minute. Other EKG findings include: PVCs.  LEG MOVEMENT DATA The total Periodic Limb Movements of Sleep (PLMS) were 0. The PLMS index was 0.0. A PLMS index of <15 is considered normal in adults.  IMPRESSIONS - The optimal PAP pressure was 11 cm of water. - Central sleep apnea was not noted during this titration (CAI = 0.5/h). - Severe oxygen desaturations were observed during this titration (min O2 = 75.0%). On CPAP 11, minimum O2 sat 87%, Mean 92.9%. - The patient snored with soft snoring volume during this titration study. - 2-lead EKG demonstrated: PVCs - Clinically significant periodic limb movements were not noted during this study. Arousals associated with PLMs were rare.  DIAGNOSIS - Obstructive Sleep Apnea (G47.33)  RECOMMENDATIONS - Trial of CPAP therapy on 11 cm H2O or autopap 5-15. - Patient wore a Large size Fisher[employer]Paykel Full Face Evora Full mask and heated humidification. - Be careful with alcohol, sedatives and other CNS depressants that may worsen sleep apnea and disrupt normal sleep architecture. - Sleep hygiene should be reviewed to assess factors that may improve sleep quality. - Weight management and regular exercise should be initiated or continued.  [Electronically signed] 07/14/2023 02:37 PM  Jetty Duhamel MD, ABSM Diplomate, American Board of Sleep Medicine NPI: [id number]                          Jetty Duhamel Diplomate, American Board of Sleep Medicine  ELECTRONICALLY SIGNED ON:  07/14/2023, 2:34 PM [facility] PH: [phone]   FX: [phone] ACCREDITED BY THE AMERICAN ACADEMY OF SLEEP MEDICINE

## 2023-07-15 ENCOUNTER — Ambulatory Visit (INDEPENDENT_AMBULATORY_CARE_PROVIDER_SITE_OTHER): Payer: Medicare Other | Admitting: Pulmonary Disease

## 2023-07-15 ENCOUNTER — Encounter: Payer: Self-pay | Admitting: Pulmonary Disease

## 2023-07-15 ENCOUNTER — Ambulatory Visit (INDEPENDENT_AMBULATORY_CARE_PROVIDER_SITE_OTHER): Payer: Medicare Other | Admitting: Internal Medicine

## 2023-07-15 VITALS — BP 124/77 | HR 73 | Temp 97.7°F | Ht 66.5 in | Wt 216.0 lb

## 2023-07-15 DIAGNOSIS — G4733 Obstructive sleep apnea (adult) (pediatric): Secondary | ICD-10-CM

## 2023-07-15 DIAGNOSIS — J84112 Idiopathic pulmonary fibrosis: Secondary | ICD-10-CM

## 2023-07-15 LAB — PULMONARY FUNCTION TEST
DL/VA % pred: 81 %
DL/VA: 3.27 ml/min/mmHg/L
DLCO cor % pred: 48 %
DLCO cor: 10.14 ml/min/mmHg
DLCO unc % pred: 48 %
DLCO unc: 10.02 ml/min/mmHg
FEF 25-75 Pre: 3 L/s
FEF2575-%Pred-Pre: 161 %
FEV1-%Pred-Pre: 87 %
FEV1-Pre: 2.08 L
FEV1FVC-%Pred-Pre: 120 %
FEV6-%Pred-Pre: 76 %
FEV6-Pre: 2.3 L
FEV6FVC-%Pred-Pre: 104 %
FVC-%Pred-Pre: 72 %
FVC-Pre: 2.3 L
Pre FEV1/FVC ratio: 91 %
Pre FEV6/FVC Ratio: 100 %

## 2023-07-15 NOTE — Progress Notes (Signed)
[name]    [id number]    1951/05/29  Primary Care Physician:Stephens, [name], NP  Referring Physician: [name], NP [address] [place],  [place] [address]  Chief complaint:   Patient with obstructive sleep apnea on CPAP therapy HPI:  Patient with a history of obstructive sleep apnea Recently had a retitration study showed that she was titrated to CPAP of 11 with recommendations of auto CPAP 5-15 with 2 L of oxygen   Following up with Dr. [name] for pulmonary fibrosis  Was diagnosed with obstructive sleep apnea about 8 to 9 years ago recently had COVID in July and followed by pulmonary embolism Has not been back to using CPAP on a regular basis since then Did have difficulty tolerating it prior to that as well  She has oxygen to use at night but she has not been using much of it She does use oxygen with activity  Admits to dryness of the mouth in the morning Occasional headaches  Usually goes to bed at about 3 AM Wakes up between 11 and 12 midday  She feels rested when she wakes up She continues to snore and wakes herself up snoring sometimes  DME company is [employer]  Outpatient Encounter Medications as of 07/15/2023  Medication Sig   albuterol (VENTOLIN HFA) 108 (90 Base) MCG/ACT inhaler Inhale 2 puffs into the lungs every 6 (six) hours as needed for wheezing or shortness of breath.   apixaban (ELIQUIS) 5 MG TABS tablet Take 1 tablet (5 mg total) by mouth 2 (two) times daily.   azelastine (ASTELIN) 0.1 % nasal spray Place 1 spray into both nostrils 2 (two) times daily. Use in each nostril as directed   benzonatate (TESSALON) 200 MG capsule Take 1 capsule (200 mg total) by mouth 3 (three) times daily as needed for cough.   budesonide-formoterol (SYMBICORT) 80-4.5 MCG/ACT inhaler Inhale 1 puff into the lungs 2 (two) times daily.   Cholecalciferol (DIALYVITE VITAMIN D 5000) 125 MCG (5000 UT) capsule Take 5,000 Units by mouth daily.    cloBAZam (ONFI) 10 MG tablet Take 1 tablet (10 mg total) by mouth 2 (two) times daily.   diltiazem (CARDIZEM CD) 120 MG 24 hr capsule Take 1 capsule (120 mg total) by mouth daily.   empagliflozin (JARDIANCE) 10 MG TABS tablet Take 1 tablet (10 mg total) by mouth daily.   Evolocumab (REPATHA SURECLICK) 140 MG/ML SOAJ Inject 140 mg into the skin every 14 (fourteen) days.   famotidine (PEPCID) 40 MG tablet TAKE 1 TABLET AT BEDTIME (NEED MD APPOINTMENT FOR REFILLS)   fexofenadine (ALLEGRA) 180 MG tablet Take 180 mg by mouth daily as needed for allergies (alternates with zyrtec sometimes).   fluticasone (FLONASE) 50 MCG/ACT nasal spray Place 2 sprays into both nostrils daily as needed for allergies or rhinitis.   FOLIC ACID PO Take 1 tablet by mouth daily.   furosemide (LASIX) 20 MG tablet TAKE 2 TABLETS (40 MG) 3 DAYS PER WEEK AND TAKE 1 TABLET (20 MG) THE OTHER DAYS OF THE WEEK   hydroquinone 4 % cream Apply 1 Application topically at bedtime.   hydroxypropyl methylcellulose / hypromellose (ISOPTO TEARS / GONIOVISC) 2.5 % ophthalmic solution Place 1 drop into both eyes at bedtime.   latanoprost (XALATAN) 0.005 % ophthalmic solution Place 1 drop into both eyes at bedtime.   levocetirizine (XYZAL) 5 MG tablet Take 5 mg by mouth daily at 12 noon.   levothyroxine (  SYNTHROID) 50 MCG tablet TAKE 1 TABLET EVERY DAY   magnesium oxide (MAG-OX) 400 (240 Mg) MG tablet TAKE 1 TABLET TWICE DAILY   montelukast (SINGULAIR) 10 MG tablet TAKE 1 TABLET AT BEDTIME   Multiple Vitamins-Minerals (MULTIVITAMIN PO) Take 1 tablet by mouth daily.   pantoprazole (PROTONIX) 40 MG tablet TAKE 1 TABLET EVERY DAY   Pirfenidone 267 MG TABS Take 3 tablets (801 mg total) by mouth 3 (three) times daily with meals.   polyethylene glycol powder (GLYCOLAX/MIRALAX) powder Take 17 grams by mouth daily   potassium chloride (KLOR-CON M) 10 MEQ tablet TAKE 1/2 TABLET EVERY DAY   promethazine-dextromethorphan (PROMETHAZINE-DM) 6.25-15  MG/5ML syrup Take 5 mLs by mouth 4 (four) times daily as needed for cough.   Respiratory Therapy Supplies (FLUTTER) DEVI 1 Device by Does not apply route as needed.   sodium chloride HYPERTONIC 3 % nebulizer solution Take by nebulization 2 (two) times daily as needed for cough. 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   tiZANidine (ZANAFLEX) 4 MG tablet Take 0.5-1 tablets (2-4 mg total) by mouth 2 (two) times daily.   traZODone (DESYREL) 100 MG tablet TAKE 1 TO 1 AND 1/2 TABLETS AT BEDTIME   Facility-Administered Encounter Medications as of 07/15/2023  Medication   lidocaine (XYLOCAINE) 1 % (with pres) injection 1 mL   lidocaine (XYLOCAINE) 1 % (with pres) injection 3 mL    Allergies as of 07/15/2023 - Review Complete 07/15/2023  Allergen Reaction Noted   Ace inhibitors Swelling 02/18/2014   Amitriptyline hcl Other (See Comments) 04/18/2007   Atorvastatin Other (See Comments) 04/18/2007   Dilantin [phenytoin sodium extended] Rash 02/19/2012   Duloxetine hcl Nausea Only and Other (See Comments) 12/09/2018   Paroxetine Nausea Only 04/18/2007   Ramipril Other (See Comments) 12/01/2014   Rosuvastatin Other (See Comments) 04/18/2007   Carbamazepine Rash 04/18/2007   Codeine Itching 04/18/2007   Phenytoin sodium extended Rash 02/19/2012   Simvastatin Other (See Comments) 04/20/2021   Wellbutrin [bupropion] Palpitations 02/24/2018    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   Bronchiectasis (HCC)    Cerebral aneurysm, nonruptured    had a clip put in   CHF (congestive heart failure) (HCC)    Clostridium difficile infection    Depressive disorder, not elsewhere classified    Diverticulosis of colon (without mention of hemorrhage)    Esophageal reflux    Fatty liver    Fibromyalgia    Gastritis    GERD (gastroesophageal  reflux disease)    GI bleed 2004   Glaucoma    Hiatal hernia    History of COVID-19 05/04/2021   Hyperlipidemia    Hypertension    Hypothyroidism    Internal hemorrhoids    Joint pain    Multifocal pneumonia 06/03/2021   Obstructive sleep apnea (adult) (pediatric)    Osteoarthritis    Ostium secundum type atrial septal defect    Other chronic nonalcoholic liver disease    Other pulmonary embolism and infarction    Palpitations    Paroxysmal ventricular tachycardia (HCC)    Personal history of radiation therapy    PONV (postoperative nausea and vomiting)    Presence of permanent cardiac pacemaker    PUD (peptic ulcer disease)    Radiation 02/03/15-03/10/15   Right Breast   Sarcoid  per pt , not sure   Schatzki's ring    Shortness of breath    Sleep apnea    Stroke [facility]) 2013   tia/ pt feels it was around 2008 0r 2009   Takotsubo syndrome    Tubular adenoma of colon    Unspecified transient cerebral ischemia    Unspecified vitamin D deficiency     Past Surgical History:  Procedure Laterality Date   ABDOMINAL HYSTERECTOMY  11/22/1998   ABI  2006   normal   BREAST EXCISIONAL BIOPSY     BREAST LUMPECTOMY Right 2016   BREAST LUMPECTOMY WITH RADIOACTIVE SEED LOCALIZATION Right 01/03/2015   Procedure: BREAST LUMPECTOMY WITH RADIOACTIVE SEED LOCALIZATION;  Surgeon: Chevis Pretty III, MD;  Location: [facility] OR;  Service: General;  Laterality: Right;   CARDIAC DEFIBRILLATOR PLACEMENT  2006; 2012   BSX single chamber ICD   carotid dopplers  2006   neg   CEREBRAL ANEURYSM REPAIR  02/1999   COLONOSCOPY     EYE SURGERY     HAMMER TOE SURGERY  11/15/2020   3rd digit bilateral feet    hospitalization  2004   GI bleed, PUD, diverticulosis (EGD,colonscopy)   hospitalization     PE, NSVT, s/p defib   JOINT REPLACEMENT  05/2021   KNEE ARTHROSCOPY     bilateral   LEFT HEART CATHETERIZATION WITH CORONARY ANGIOGRAM N/A 02/22/2012   Procedure: LEFT HEART CATHETERIZATION WITH CORONARY  ANGIOGRAM;  Surgeon: Kathleene Hazel, MD;  [facility];  Service: Cardiovascular;  Laterality: N/A;   PARTIAL HYSTERECTOMY     Fibroids   TONGUE BIOPSY  12/12/2017   due to sore tongue and white patches/abnormal cells   TOTAL KNEE ARTHROPLASTY Left 02/13/2016   Procedure: TOTAL KNEE ARTHROPLASTY;  Surgeon: Dannielle Huh, MD;  Location: [facility] OR;  Service: Orthopedics;  Laterality: Left;   TOTAL KNEE ARTHROPLASTY Right 05/22/2021   Procedure: TOTAL KNEE ARTHROPLASTY;  Surgeon: Dannielle Huh, MD;  Location: [facility] ORS;  Service: Orthopedics;  Laterality: Right;  with block   TUBAL LIGATION  09/20/1978   UPPER GASTROINTESTINAL ENDOSCOPY      Family History  Problem Relation Age of Onset   Diabetes Mother    Alzheimer's disease Mother    Hypertension Mother    Obesity Mother    Arthritis Mother    Heart disease Father    Seizures Sister    Allergies Sister    Heart disease Sister    Early death Sister    Other Brother        Thyroid problem 10/2016   Hearing loss Brother    Vision loss Brother    Heart disease Brother    Prostate cancer Brother 70       same brother as throat cancer   Throat cancer Brother        dx in his 10s; also a smoker   Arthritis Brother    Hypertension Brother    Cancer Maternal Grandmother 70       colon cancer or abdominal cancer   Lung cancer Maternal Grandfather 25   Heart disease Son        Cardiac Arrest 07/2016   Breast cancer Maternal Aunt 55   Colon cancer Maternal Aunt 72       same sister as breast at 72   Breast cancer Maternal Aunt        dx in her 83s   Lung cancer Maternal Uncle    Pancreatic cancer Cousin 32  maternal first cousin   Breast cancer Cousin        paternal first cousin twice removed died in her 55s   Anxiety disorder Sister    Arthritis Sister    Depression Sister    Hypertension Sister    Stroke Neg Hx     Social History   Socioeconomic History   Marital status: Widowed    Spouse name: Not on  file   Number of children: 2   Years of education: 14   Highest education level: Associate degree: occupational, Scientist, product/process development, or vocational program  Occupational History   Occupation: DISABLED  Tobacco Use   Smoking status: Former    Current packs/day: 0.00    Average packs/day: 1 pack/day for 20.0 years (20.0 ttl pk-yrs)    Types: Cigarettes    Start date: 08/20/1969    Quit date: 08/20/1989    Years since quitting: 33.9    Passive exposure: Past   Smokeless tobacco: Never  Vaping Use   Vaping status: Never Used  Substance and Sexual Activity   Alcohol use: Not Currently    Alcohol/week: 1.0 standard drink of alcohol    Types: 1 Glasses of wine per week    Comment: occasionally, not on a weekly basis   Drug use: Not Currently    Frequency: 1.0 times per week    Comment: CBD gummy occ; marijuana occasionally   Sexual activity: Not Currently    Birth control/protection: Abstinence  Other Topics Concern   Not on file  Social History Narrative   Lives alone   Caffeine GNF:AOZH   Retired from [employer]   2 children -- 5 grandbabies - 1 great-grandbaby    Social Determinants of Health   Financial Resource Strain: Patient Declined (06/04/2023)   Received from [employer]   Overall Financial Resource Strain (CARDIA)    Difficulty of Paying Living Expenses: Patient declined  Food Insecurity: Patient Declined (06/04/2023)   Received from [facility]   Hunger Vital Sign    Worried About Running Out of Food in the Last Year: Patient declined    Ran Out of Food in the Last Year: Patient declined  Transportation Needs: No Transportation Needs (06/04/2023)   Received from [employer] - Transportation    Lack of Transportation (Medical): No    Lack of Transportation (Non-Medical): No  Physical Activity: Insufficiently Active (06/18/2023)   Exercise Vital Sign    Days of Exercise per Week: 2 days    Minutes of Exercise per Session: 10 min  Stress: Stress Concern  Present (06/18/2023)   [employer] of Occupational Health - Occupational Stress Questionnaire    Feeling of Stress : To some extent  Social Connections: Somewhat Isolated (06/04/2023)   Received from [facility]   Social Network    How would you rate your social network (family, work, friends)?: Restricted participation with some degree of social isolation  Intimate Partner Violence: Not At Risk (06/04/2023)   Received from [facility]   HITS    Over the last 12 months how often did your partner physically hurt you?: Never    Over the last 12 months how often did your partner insult you or talk down to you?: Never    Over the last 12 months how often did your partner threaten you with physical harm?: Never    Over the last 12 months how often did your partner scream or curse at you?: Never    Review of Systems  Constitutional:  Positive for fatigue.  Respiratory:  Positive for apnea and shortness of breath.   Psychiatric/Behavioral:  Positive for sleep disturbance.     Vitals:   07/15/23 1433  BP: 124/77  Pulse: 73  Temp: 97.7 F (36.5 C)  SpO2: 97%     Physical Exam Constitutional:      Appearance: She is obese.  HENT:     Head: Normocephalic.     Nose: Nose normal.     Mouth/Throat:     Mouth: Mucous membranes are moist.  Eyes:     Pupils: Pupils are equal, round, and reactive to light.  Cardiovascular:     Rate and Rhythm: Normal rate and regular rhythm.     Heart sounds: No murmur heard.    No friction rub.  Pulmonary:     Effort: No respiratory distress.     Breath sounds: No stridor. Rales present. No wheezing or rhonchi.  Musculoskeletal:     Cervical back: No rigidity or tenderness.  Neurological:     Mental Status: She is alert.  Psychiatric:        Mood and Affect: Mood normal.      Data Reviewed: Sleep study was from 2017-reviewed by myself showing mild obstructive sleep apnea with mild oxygen desaturations  Compliance data reviewed  showing good compliance CPAP of 5 residual AHI of 4.5  Pulmonary function test does show severely reduced diffusing capacity of 48%, FVC of 72, FEV1 of 87%  Assessment:  History of mild obstructive sleep apnea Encouraged to continue using CPAP Needs CPAP auto Auto CPAP 5-15 with patient's mask of choice with 2 L of oxygen piped into the system -Encouraged to continue CPAP nightly  History of pulmonary fibrosis -On pirfenidone, follows up with Dr. [name]  History of bronchiectasis  Plan/Recommendations:  DME referral for auto CPAP 5-15 with heated humidification with patient's mask of choice Oxygen supplementation at 2 L  Encouraged to continue using CPAP on a nightly basis  Follow-up in about 6 weeks  Graded activities as tolerated  Encouraged to call with significant concerns  Will continue to follow-up with Dr. [name] regarding her pulmonary fibrosis  [facility] 07/15/2023, 2:44 PM  CC: [name], NP

## 2023-07-15 NOTE — Patient Instructions (Signed)
DME referral for new CPAP settings  Auto CPAP 5-15 with patient's mask of choice  With oxygen supplementation added at 2 L  Follow-up in about 6 to 8 weeks  Call us with significant concerns

## 2023-07-16 ENCOUNTER — Encounter (HOSPITAL_COMMUNITY): Payer: Medicare Other

## 2023-07-16 ENCOUNTER — Ambulatory Visit: Payer: Self-pay | Admitting: Licensed Clinical Social Worker

## 2023-07-16 DIAGNOSIS — G4733 Obstructive sleep apnea (adult) (pediatric): Secondary | ICD-10-CM

## 2023-07-16 NOTE — Patient Outreach (Signed)
  Care Coordination   Follow Up Visit Note   07/16/2023 Name: [name] MRN: [id number] DOB: November 04, 1950  [name] is a 72 y.o. year old female who sees Adriana Fendt, NP for primary care. I spoke with  [name] by phone today.  What matters to the patients health and wellness today?  Patient has stress in managing medical needs. She has financial stress issues.     Goals Addressed             This Visit's Progress    Patient stated she has stress in managing medical needs.       Interventions:    Spoke with client via phone today about her current needs. She said she is going to cardiac rehab 2 times a weeks as scheduled Client and LCSW spoke of breathing status of client. She said she uses oxygen as needed to help her breathe.  Discussed sleeping issues. She said she uses C-PAP to help her sleep. She said she recently completed a sleep study Discussed medication procurement of client. Client said she does get help with paying for one of her expensive medications (Repatha).  This is helpful. She still has some difficulty paying for some of her other medications Discussed walking of client. She said she is walking well. She does have low energy occasionally and has to take rest breaks Reviewed pain issues. She spoke of back pain issues Discussed transport needs of client. She said she is driving to and from her appointments Provided counseling support Discussed support from Adriana Stabs, NP.  Client said she sees NP next Tuesday for an appointment. Regarding breathing needs, [name] said she uses an inhaler and nebulizer as prescribed Client said she uses oxygen as needed.  Informed client of program support with RN, LCSW, Pharmacist LCSW thanked client for phone call with LCSW today Encouraged client to call LCSW as needed for SW support at [phone].           SDOH assessments and interventions completed:  Yes  SDOH Interventions Today     Flowsheet Row Most Recent Value  SDOH Interventions   Depression Interventions/Treatment  Counseling  Physical Activity Interventions Other (Comments)  Stress Interventions Provide Counseling  [client said that some of her medications are expensive]        Care Coordination Interventions:  Yes, provided  Interventions Today    Flowsheet Row Most Recent Value  Chronic Disease   Chronic disease during today's visit Other  [spoke with client about client needs]  General Interventions   General Interventions Discussed/Reviewed General Interventions Discussed, Community Resources  Exercise Interventions   Exercise Discussed/Reviewed Physical Activity  Education Interventions   Education Provided Provided Education  Provided Verbal Education On Community Resources  Mental Health Interventions   Mental Health Discussed/Reviewed Coping Strategies  [client has financial strain issues. she is trying to cope with financial strain]  Nutrition Interventions   Nutrition Discussed/Reviewed Nutrition Discussed  Pharmacy Interventions   Pharmacy Dicussed/Reviewed Pharmacy Topics Discussed  [name] said that some of her medications are expensive]  Safety Interventions   Safety Discussed/Reviewed Fall Risk        Follow up plan: Follow up call scheduled for 09/03/23 at 4:00 PM     Encounter Outcome:  Patient Visit Completed   Kelton Pillar.Manoj Enriquez MSW, LCSW Licensed Visual merchandiser [facility] Care Management [phone]

## 2023-07-16 NOTE — Progress Notes (Signed)
      Latest Ref Rng & Units 07/15/2023    1:56 PM 02/19/2023   11:26 AM 08/24/2022    1:55 PM 06/08/2022    3:49 PM 10/16/2018   12:55 PM 01/17/2017    2:50 PM  PFT Results  FVC-Pre L 2.30  P 2.25  2.30  2.33  2.52  2.63   FVC-Predicted Pre % 72  P 71  72  73  96  98   FVC-Post L    2.36  2.49  2.52   FVC-Predicted Post %    74  94  94   Pre FEV1/FVC % % 91  P 88  92  93  90  90   Post FEV1/FCV % %    93  91  92   FEV1-Pre L 2.08  P 1.97  2.12  2.17  2.27  2.38   FEV1-Predicted Pre % 87  P 82  88  90  111  113   FEV1-Post L    2.20  2.26  2.32   DLCO uncorrected ml/min/mmHg 10.02  P 11.85  11.05  12.95  12.43  15.90   DLCO UNC% % 48  P 56  52  62  59  58   DLCO corrected ml/min/mmHg 10.14  P 11.85  11.05  12.95   15.80   DLCO COR %Predicted % 48  P 56  52  62   57   DLVA Predicted % 81  P 94  87  93  85  80   TLC L    3.76  4.00  3.85   TLC % Predicted %    69  74  71   RV % Predicted %    55  58  55     P Preliminary result

## 2023-07-16 NOTE — Patient Instructions (Signed)
Visit Information  Thank you for taking time to visit with me today. Please don't hesitate to contact me if I can be of assistance to you.   Following are the goals we discussed today:   Goals Addressed             This Visit's Progress    Patient stated she has stress in managing medical needs.       Interventions:    Spoke with client via phone today about her current needs. She said she is going to cardiac rehab 2 times a weeks as scheduled Client and LCSW spoke of breathing status of client. She said she uses oxygen as needed to help her breathe.  Discussed sleeping issues. She said she uses C-PAP to help her sleep. She said she recently completed a sleep study Discussed medication procurement of client. Client said she does get help with paying for one of her expensive medications (Repatha).  This is helpful. She still has some difficulty paying for some of her other medications Discussed walking of client. She said she is walking well. She does have low energy occasionally and has to take rest breaks Reviewed pain issues. She spoke of back pain issues Discussed transport needs of client. She said she is driving to and from her appointments Provided counseling support Discussed support from Ricky Stabs, NP.  Client said she sees NP next Tuesday for an appointment. Regarding breathing needs, Chumy said she uses an inhaler and nebulizer as prescribed Client said she uses oxygen as needed.  Informed client of program support with RN, LCSW, Pharmacist LCSW thanked client for phone call with LCSW today Encouraged client to call LCSW as needed for SW support at [phone].           Our next appointment is by telephone on 09/03/23 at 4:00 PM   Please call the care guide team at [phone] if you need to cancel or reschedule your appointment.   If you are experiencing a Mental Health or Behavioral Health Crisis or need someone to talk to, please go to [facility] [address], [place] [phone])   The patient verbalized understanding of instructions, educational materials, and care plan provided today and DECLINED offer to receive copy of patient instructions, educational materials, and care plan.   The patient has been provided with contact information for the care management team and has been advised to call with any health related questions or concerns.   Kelton Pillar.Ivori Storr MSW, LCSW Licensed Visual merchandiser [facility] [phone]

## 2023-07-17 NOTE — Progress Notes (Signed)
Pulmonary Individual Treatment Plan  Patient Details  Name: [name] MRN: [id number] Date of Birth: Apr 06, 1951 Referring Provider:   Doristine Devoid Pulmonary Rehab Walk Test from 06/03/2023 in [facility]  Referring Provider Ramaswamy       Initial Encounter Date:  Flowsheet Row Pulmonary Rehab Walk Test from 06/03/2023 in [facility]  Date 06/03/23       Visit Diagnosis: IPF (idiopathic pulmonary fibrosis) (HCC)  Patient's Home Medications on Admission:   Current Outpatient Medications:    albuterol (VENTOLIN HFA) 108 (90 Base) MCG/ACT inhaler, Inhale 2 puffs into the lungs every 6 (six) hours as needed for wheezing or shortness of breath., Disp: 51 each, Rfl: 1   apixaban (ELIQUIS) 5 MG TABS tablet, Take 1 tablet (5 mg total) by mouth 2 (two) times daily., Disp: 180 tablet, Rfl: 1   azelastine (ASTELIN) 0.1 % nasal spray, Place 1 spray into both nostrils 2 (two) times daily. Use in each nostril as directed, Disp: 90 mL, Rfl: 3   benzonatate (TESSALON) 200 MG capsule, Take 1 capsule (200 mg total) by mouth 3 (three) times daily as needed for cough., Disp: 90 capsule, Rfl: 2   budesonide-formoterol (SYMBICORT) 80-4.5 MCG/ACT inhaler, Inhale 1 puff into the lungs 2 (two) times daily., Disp: 3 each, Rfl: 4   Cholecalciferol (DIALYVITE VITAMIN D 5000) 125 MCG (5000 UT) capsule, Take 5,000 Units by mouth daily., Disp: , Rfl:    cloBAZam (ONFI) 10 MG tablet, Take 1 tablet (10 mg total) by mouth 2 (two) times daily., Disp: 180 tablet, Rfl: 1   diltiazem (CARDIZEM CD) 120 MG 24 hr capsule, Take 1 capsule (120 mg total) by mouth daily., Disp: 90 capsule, Rfl: 3   empagliflozin (JARDIANCE) 10 MG TABS tablet, Take 1 tablet (10 mg total) by mouth daily., Disp: 90 tablet, Rfl: 3   Evolocumab (REPATHA SURECLICK) 140 MG/ML SOAJ, Inject 140 mg into the skin every 14  (fourteen) days., Disp: 6 mL, Rfl: 3   famotidine (PEPCID) 40 MG tablet, TAKE 1 TABLET AT BEDTIME (NEED MD APPOINTMENT FOR REFILLS), Disp: 90 tablet, Rfl: 3   fexofenadine (ALLEGRA) 180 MG tablet, Take 180 mg by mouth daily as needed for allergies (alternates with zyrtec sometimes)., Disp: , Rfl:    fluticasone (FLONASE) 50 MCG/ACT nasal spray, Place 2 sprays into both nostrils daily as needed for allergies or rhinitis., Disp: 48 g, Rfl: 3   FOLIC ACID PO, Take 1 tablet by mouth daily., Disp: , Rfl:    furosemide (LASIX) 20 MG tablet, TAKE 2 TABLETS (40 MG) 3 DAYS PER WEEK AND TAKE 1 TABLET (20 MG) THE OTHER DAYS OF THE WEEK, Disp: 130 tablet, Rfl: 2   hydroquinone 4 % cream, Apply 1 Application topically at bedtime., Disp: , Rfl:    hydroxypropyl methylcellulose / hypromellose (ISOPTO TEARS / GONIOVISC) 2.5 % ophthalmic solution, Place 1 drop into both eyes at bedtime., Disp: , Rfl:    latanoprost (XALATAN) 0.005 % ophthalmic solution, Place 1 drop into both eyes at bedtime., Disp: , Rfl:    levocetirizine (XYZAL) 5 MG tablet, Take 5 mg by mouth daily at 12 noon., Disp: , Rfl:    levothyroxine (SYNTHROID) 50 MCG tablet, TAKE 1 TABLET EVERY DAY, Disp: 90 tablet, Rfl: 3   magnesium oxide (MAG-OX) 400 (240 Mg) MG tablet, TAKE 1 TABLET TWICE DAILY, Disp: 180 tablet, Rfl: 3   montelukast (  SINGULAIR) 10 MG tablet, TAKE 1 TABLET AT BEDTIME, Disp: 90 tablet, Rfl: 3   Multiple Vitamins-Minerals (MULTIVITAMIN PO), Take 1 tablet by mouth daily., Disp: , Rfl:    pantoprazole (PROTONIX) 40 MG tablet, TAKE 1 TABLET EVERY DAY, Disp: 90 tablet, Rfl: 3   Pirfenidone 267 MG TABS, Take 3 tablets (801 mg total) by mouth 3 (three) times daily with meals., Disp: 270 tablet, Rfl: 4   polyethylene glycol powder (GLYCOLAX/MIRALAX) powder, Take 17 grams by mouth daily, Disp: 1080 g, Rfl: 0   potassium chloride (KLOR-CON M) 10 MEQ tablet, TAKE 1/2 TABLET EVERY DAY, Disp: 45 tablet, Rfl: 2   promethazine-dextromethorphan  (PROMETHAZINE-DM) 6.25-15 MG/5ML syrup, Take 5 mLs by mouth 4 (four) times daily as needed for cough., Disp: 118 mL, Rfl: 0   Respiratory Therapy Supplies (FLUTTER) DEVI, 1 Device by Does not apply route as needed., Disp: 1 each, Rfl: 0   sodium chloride HYPERTONIC 3 % nebulizer solution, Take by nebulization 2 (two) times daily as needed for cough. 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, Disp: 750 mL, Rfl: 12   tiZANidine (ZANAFLEX) 4 MG tablet, Take 0.5-1 tablets (2-4 mg total) by mouth 2 (two) times daily., Disp: 180 tablet, Rfl: 1   traZODone (DESYREL) 100 MG tablet, TAKE 1 TO 1 AND 1/2 TABLETS AT BEDTIME, Disp: 135 tablet, Rfl: 3  Current Facility-Administered Medications:    lidocaine (XYLOCAINE) 1 % (with pres) injection 1 mL, 1 mL, Other, Once, Lovorn, Megan, MD   lidocaine (XYLOCAINE) 1 % (with pres) injection 3 mL, 3 mL, Other, Once, Lovorn, Megan, MD  Past Medical History: 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   Bronchiectasis (HCC)    Cerebral aneurysm, nonruptured    had a clip put in   CHF (congestive heart failure) (HCC)    Clostridium difficile infection    Depressive disorder, not elsewhere classified    Diverticulosis of colon (without mention of hemorrhage)    Esophageal reflux    Fatty liver    Fibromyalgia    Gastritis    GERD (gastroesophageal reflux disease)    GI bleed 2004   Glaucoma    Hiatal hernia    History of COVID-19 05/04/2021   Hyperlipidemia    Hypertension    Hypothyroidism    Internal hemorrhoids    Joint pain    Multifocal pneumonia 06/03/2021   Obstructive sleep apnea (adult) (pediatric)    Osteoarthritis    Ostium secundum type atrial septal defect    Other chronic nonalcoholic liver disease    Other pulmonary embolism and infarction    Palpitations    Paroxysmal  ventricular tachycardia (HCC)    Personal history of radiation therapy    PONV (postoperative nausea and vomiting)    Presence of permanent cardiac pacemaker    PUD (peptic ulcer disease)    Radiation 02/03/15-03/10/15   Right Breast   Sarcoid    per pt , not sure   Schatzki's ring    Shortness of breath    Sleep apnea    Stroke [facility]) 2013   tia/ pt feels it was around 2008 0r 2009   Takotsubo syndrome    Tubular adenoma of colon    Unspecified transient cerebral ischemia    Unspecified vitamin D deficiency  Tobacco Use: Social History   Tobacco Use  Smoking Status Former   Current packs/day: 0.00   Average packs/day: 1 pack/day for 20.0 years (20.0 ttl pk-yrs)   Types: Cigarettes   Start date: 08/20/1969   Quit date: 08/20/1989   Years since quitting: 33.9   Passive exposure: Past  Smokeless Tobacco Never    Labs: Review Flowsheet  More data exists      Latest Ref Rng & Units 12/19/2021 06/27/2022 09/07/2022 10/29/2022 11/16/2022  Labs for ITP Cardiac and Pulmonary Rehab  Cholestrol 100 - 199 mg/dL - - 308  657  846   LDL (calc) 0 - 99 mg/dL - - 962  952  841   HDL-C >39 mg/dL - - 84  63  81   Trlycerides 0 - 149 mg/dL - - 77  324  92   Hemoglobin A1c 4.8 - 5.6 % 5.7  5.3  - - -    Details            Capillary Blood Glucose: No results found for: "GLUCAP"   Pulmonary Assessment Scores:  Pulmonary Assessment Scores     Row Name 06/03/23 1352         ADL UCSD   ADL Phase Entry     SOB Score total 82       CAT Score   CAT Score 28       mMRC Score   mMRC Score 4             UCSD: Self-administered rating of dyspnea associated with activities of daily living (ADLs) 6-point scale (0 = "not at all" to 5 = "maximal or unable to do because of breathlessness")  Scoring Scores range from 0 to 120.  Minimally important difference is 5 units  CAT: CAT can identify the health impairment of COPD patients and is better correlated with disease  progression.  CAT has a scoring range of zero to 40. The CAT score is classified into four groups of low (less than 10), medium (10 - 20), high (21-30) and very high (31-40) based on the impact level of disease on health status. A CAT score over 10 suggests significant symptoms.  A worsening CAT score could be explained by an exacerbation, poor medication adherence, poor inhaler technique, or progression of COPD or comorbid conditions.  CAT MCID is 2 points  mMRC: mMRC (Modified Medical Research Council) Dyspnea Scale is used to assess the degree of baseline functional disability in patients of respiratory disease due to dyspnea. No minimal important difference is established. A decrease in score of 1 point or greater is considered a positive change.   Pulmonary Function Assessment:  Pulmonary Function Assessment - 06/03/23 1337       Breath   Bilateral Breath Sounds Rales;Basilar    Shortness of Breath Yes;Limiting activity;Panic with Shortness of Breath             Exercise Target Goals: Exercise Program Goal: Individual exercise prescription set using results from initial 6 min walk test and THRR while considering  patient's activity barriers and safety.   Exercise Prescription Goal: Initial exercise prescription builds to 30-45 minutes a day of aerobic activity, 2-3 days per week.  Home exercise guidelines will be given to patient during program as part of exercise prescription that the participant will acknowledge.  Activity Barriers & Risk Stratification:  Activity Barriers & Cardiac Risk Stratification - 06/03/23 1332       Activity Barriers & Cardiac Risk Stratification  Activity Barriers Back Problems;Left Knee Replacement;Right Knee Replacement;Deconditioning;Muscular Weakness;Shortness of Breath;Fibromyalgia    Cardiac Risk Stratification Moderate             6 Minute Walk:  6 Minute Walk     Row Name 06/03/23 1428         6 Minute Walk   Phase Initial      Distance 790 feet     Walk Time 6 minutes     # of Rest Breaks 0     MPH 1.5     METS 1.4     RPE 13     Perceived Dyspnea  3     VO2 Peak 4.94     Symptoms Yes (comment)     Comments Pt SOB but SpO2 never lower than 89 RA     Resting HR 80 bpm     Resting BP 120/68     Resting Oxygen Saturation  97 %     Exercise Oxygen Saturation  during 6 min walk 89 %     Max Ex. HR 107 bpm     Max Ex. BP 138/70     2 Minute Post BP 130/70       Interval HR   1 Minute HR 92     2 Minute HR 100     3 Minute HR 104     4 Minute HR 107     5 Minute HR 107     6 Minute HR 107     2 Minute Post HR 90     Interval Heart Rate? Yes       Interval Oxygen   Interval Oxygen? Yes     Baseline Oxygen Saturation % 97 %     1 Minute Oxygen Saturation % 95 %     1 Minute Liters of Oxygen 0 L     2 Minute Oxygen Saturation % 94 %     2 Minute Liters of Oxygen 0 L     3 Minute Oxygen Saturation % 91 %     3 Minute Liters of Oxygen 0 L     4 Minute Oxygen Saturation % 89 %     4 Minute Liters of Oxygen 0 L     5 Minute Oxygen Saturation % 89 %     5 Minute Liters of Oxygen 0 L     6 Minute Oxygen Saturation % 90 %     6 Minute Liters of Oxygen 0 L     2 Minute Post Oxygen Saturation % 91 %     2 Minute Post Liters of Oxygen 0 L              Oxygen Initial Assessment:  Oxygen Initial Assessment - 06/03/23 1334       Home Oxygen   Home Oxygen Device Home Concentrator;E-Tanks    Sleep Oxygen Prescription CPAP    Home Exercise Oxygen Prescription Continuous    Liters per minute 2    Home Resting Oxygen Prescription None    Compliance with Home Oxygen Use Yes      Initial 6 min Walk   Oxygen Used None    Liters per minute --      Program Oxygen Prescription   Program Oxygen Prescription None    Liters per minute --      Intervention   Short Term Goals To learn and exhibit compliance with exercise, home and travel O2 prescription;To learn and understand importance  of monitoring  SPO2 with pulse oximeter and demonstrate accurate use of the pulse oximeter.;To learn and understand importance of maintaining oxygen saturations>88%;To learn and demonstrate proper pursed lip breathing techniques or other breathing techniques. ;To learn and demonstrate proper use of respiratory medications    Long  Term Goals Exhibits compliance with exercise, home  and travel O2 prescription;Maintenance of O2 saturations>88%;Compliance with respiratory medication;Verbalizes importance of monitoring SPO2 with pulse oximeter and return demonstration;Exhibits proper breathing techniques, such as pursed lip breathing or other method taught during program session;Demonstrates proper use of MDI's             Oxygen Re-Evaluation:  Oxygen Re-Evaluation     Row Name 06/14/23 1138 07/09/23 0853           Program Oxygen Prescription   Program Oxygen Prescription Continuous Continuous      Liters per minute 3 2      Comments 3L walking, none on recumbent stepper 2L on second station the recumbent elliptical, none on recumbent stepper        Home Oxygen   Home Oxygen Device Home Concentrator;E-Tanks Home Concentrator;E-Tanks      Sleep Oxygen Prescription CPAP CPAP      Home Exercise Oxygen Prescription Continuous Continuous      Liters per minute 2 2      Home Resting Oxygen Prescription None None      Compliance with Home Oxygen Use Yes Yes        Goals/Expected Outcomes   Short Term Goals To learn and exhibit compliance with exercise, home and travel O2 prescription;To learn and understand importance of monitoring SPO2 with pulse oximeter and demonstrate accurate use of the pulse oximeter.;To learn and understand importance of maintaining oxygen saturations>88%;To learn and demonstrate proper pursed lip breathing techniques or other breathing techniques. ;To learn and demonstrate proper use of respiratory medications To learn and exhibit compliance with exercise, home and travel O2  prescription;To learn and understand importance of monitoring SPO2 with pulse oximeter and demonstrate accurate use of the pulse oximeter.;To learn and understand importance of maintaining oxygen saturations>88%;To learn and demonstrate proper pursed lip breathing techniques or other breathing techniques. ;To learn and demonstrate proper use of respiratory medications      Long  Term Goals Exhibits compliance with exercise, home  and travel O2 prescription;Maintenance of O2 saturations>88%;Compliance with respiratory medication;Verbalizes importance of monitoring SPO2 with pulse oximeter and return demonstration;Exhibits proper breathing techniques, such as pursed lip breathing or other method taught during program session;Demonstrates proper use of MDI's Exhibits compliance with exercise, home  and travel O2 prescription;Maintenance of O2 saturations>88%;Compliance with respiratory medication;Verbalizes importance of monitoring SPO2 with pulse oximeter and return demonstration;Exhibits proper breathing techniques, such as pursed lip breathing or other method taught during program session;Demonstrates proper use of MDI's      Goals/Expected Outcomes Compliance and understanding of oxygen saturation monitoring and breathing techniques to decrease shortness of breath. Compliance and understanding of oxygen saturation monitoring and breathing techniques to decrease shortness of breath.               Oxygen Discharge (Final Oxygen Re-Evaluation):  Oxygen Re-Evaluation - 07/09/23 0853       Program Oxygen Prescription   Program Oxygen Prescription Continuous    Liters per minute 2    Comments 2L on second station the recumbent elliptical, none on recumbent stepper      Home Oxygen   Home Oxygen Device Home Concentrator;E-Tanks    Sleep Oxygen Prescription CPAP  Home Exercise Oxygen Prescription Continuous    Liters per minute 2    Home Resting Oxygen Prescription None    Compliance with Home  Oxygen Use Yes      Goals/Expected Outcomes   Short Term Goals To learn and exhibit compliance with exercise, home and travel O2 prescription;To learn and understand importance of monitoring SPO2 with pulse oximeter and demonstrate accurate use of the pulse oximeter.;To learn and understand importance of maintaining oxygen saturations>88%;To learn and demonstrate proper pursed lip breathing techniques or other breathing techniques. ;To learn and demonstrate proper use of respiratory medications    Long  Term Goals Exhibits compliance with exercise, home  and travel O2 prescription;Maintenance of O2 saturations>88%;Compliance with respiratory medication;Verbalizes importance of monitoring SPO2 with pulse oximeter and return demonstration;Exhibits proper breathing techniques, such as pursed lip breathing or other method taught during program session;Demonstrates proper use of MDI's    Goals/Expected Outcomes Compliance and understanding of oxygen saturation monitoring and breathing techniques to decrease shortness of breath.             Initial Exercise Prescription:  Initial Exercise Prescription - 06/03/23 1400       Date of Initial Exercise RX and Referring Provider   Date 06/03/23    Referring Provider Ramaswamy    Expected Discharge Date 08/27/23      NuStep   Level 1    SPM 70    Minutes 15    METs 1.5      Track   Minutes 15    METs 1.5      Prescription Details   Frequency (times per week) 2    Duration Progress to 30 minutes of continuous aerobic without signs/symptoms of physical distress      Intensity   THRR 40-80% of Max Heartrate 58-117    Ratings of Perceived Exertion 11-13    Perceived Dyspnea 0-4      Progression   Progression Continue to progress workloads to maintain intensity without signs/symptoms of physical distress.      Resistance Training   Training Prescription Yes    Weight red bands    Reps 10-15             Perform Capillary Blood  Glucose checks as needed.  Exercise Prescription Changes:   Exercise Prescription Changes     Row Name 06/18/23 1500 07/02/23 1500 07/11/23 0931         Response to Exercise   Blood Pressure (Admit) 132/68 126/68 114/60     Blood Pressure (Exercise) 140/80 138/72 --     Blood Pressure (Exit) 128/76 112/66 122/64     Heart Rate (Admit) 75 bpm 97 bpm 97 bpm     Heart Rate (Exercise) 83 bpm 102 bpm 101 bpm     Heart Rate (Exit) 88 bpm 98 bpm 89 bpm     Oxygen Saturation (Admit) 95 % 92 % 95 %     Oxygen Saturation (Exercise) 94 % 96 % 93 %     Oxygen Saturation (Exit) 94 % 93 % 86 %     Rating of Perceived Exertion (Exercise) 13 13 13      Perceived Dyspnea (Exercise) 2 2 2      Duration Continue with 30 min of aerobic exercise without signs/symptoms of physical distress. Continue with 30 min of aerobic exercise without signs/symptoms of physical distress. Continue with 30 min of aerobic exercise without signs/symptoms of physical distress.     Intensity THRR unchanged THRR unchanged THRR unchanged  Progression   Progression Continue to progress workloads to maintain intensity without signs/symptoms of physical distress. Continue to progress workloads to maintain intensity without signs/symptoms of physical distress. Continue to progress workloads to maintain intensity without signs/symptoms of physical distress.       Resistance Training   Training Prescription Yes Yes Yes     Weight red bands red bands blue     Reps 10-15 10-15 10-15     Time 10 Minutes 10 Minutes 10 Minutes       Interval Training   Interval Training No -- No       Oxygen   Oxygen Continuous Continuous Continuous     Liters 2 2 2        NuStep   Level 1 3 3      Minutes 15 15 15      METs 1.7 2.1 2.4       Recumbant Elliptical   Level -- 2 2     Minutes -- 15 15     METs -- 2.6 3       Track   Laps 8 -- --     Minutes 15 -- --     METs 2.23 -- --       Oxygen   Maintain Oxygen Saturation 88%  or higher 88% or higher 88% or higher              Exercise Comments:   Exercise Comments     Row Name 06/11/23 1542           Exercise Comments Pt completed her first group exercise session. She exercised on the recumbent stepper for 15 min, level 1, METs 1.6. She then walked on the track for 15 min, METs 2.23. Her SpO2 decreased to 84 RA therefore applied 2L. She performed warm up and cool down standing, including squats. Discussed METs with good reception.                Exercise Goals and Review:   Exercise Goals     Row Name 06/03/23 1334             Exercise Goals   Increase Physical Activity Yes       Intervention Provide advice, education, support and counseling about physical activity/exercise needs.;Develop an individualized exercise prescription for aerobic and resistive training based on initial evaluation findings, risk stratification, comorbidities and participant's personal goals.       Expected Outcomes Short Term: Attend rehab on a regular basis to increase amount of physical activity.;Long Term: Add in home exercise to make exercise part of routine and to increase amount of physical activity.;Long Term: Exercising regularly at least 3-5 days a week.       Increase Strength and Stamina Yes       Intervention Provide advice, education, support and counseling about physical activity/exercise needs.;Develop an individualized exercise prescription for aerobic and resistive training based on initial evaluation findings, risk stratification, comorbidities and participant's personal goals.       Expected Outcomes Short Term: Increase workloads from initial exercise prescription for resistance, speed, and METs.;Short Term: Perform resistance training exercises routinely during rehab and add in resistance training at home;Long Term: Improve cardiorespiratory fitness, muscular endurance and strength as measured by increased METs and functional capacity ( )        Able to understand and use rate of perceived exertion (RPE) scale Yes       Intervention Provide education and explanation on how to use RPE scale  Expected Outcomes Short Term: Able to use RPE daily in rehab to express subjective intensity level;Long Term:  Able to use RPE to guide intensity level when exercising independently       Able to understand and use Dyspnea scale Yes       Intervention Provide education and explanation on how to use Dyspnea scale       Expected Outcomes Short Term: Able to use Dyspnea scale daily in rehab to express subjective sense of shortness of breath during exertion;Long Term: Able to use Dyspnea scale to guide intensity level when exercising independently       Knowledge and understanding of Target Heart Rate Range (THRR) Yes       Intervention Provide education and explanation of THRR including how the numbers were predicted and where they are located for reference       Expected Outcomes Short Term: Able to state/look up THRR;Long Term: Able to use THRR to govern intensity when exercising independently;Short Term: Able to use daily as guideline for intensity in rehab       Understanding of Exercise Prescription Yes       Intervention Provide education, explanation, and written materials on patient's individual exercise prescription       Expected Outcomes Long Term: Able to explain home exercise prescription to exercise independently;Short Term: Able to explain program exercise prescription                Exercise Goals Re-Evaluation :  Exercise Goals Re-Evaluation     Row Name 06/14/23 1134 07/09/23 0850           Exercise Goal Re-Evaluation   Exercise Goals Review Increase Physical Activity;Able to understand and use Dyspnea scale;Understanding of Exercise Prescription;Increase Strength and Stamina;Knowledge and understanding of Target Heart Rate Range (THRR);Able to understand and use rate of perceived exertion (RPE) scale Increase Physical  Activity;Able to understand and use Dyspnea scale;Understanding of Exercise Prescription;Increase Strength and Stamina;Knowledge and understanding of Target Heart Rate Range (THRR);Able to understand and use rate of perceived exertion (RPE) scale      Comments Pt has completed 2 group exercise sessions. She is exercising on the recumbent stepper for 15 min, level 1, METs 1.8. She then is walking on the track for 15 min, METs 2.23. She is needing 2-3L O2 walking.  She performed warm up and cool down standing, including squats. Will progress as tolerated. Pt has completed 8 group exercise sessions. She is exercising on the recumbent stepper for 15 min, level 3, METs 2.3. She transitioned from the track to the recumbent elliptical due to hip pain. She does level 2, METs 2.8. She is increasing her METs every session and tolerating well. Will continue to progress as tolerated.      Expected Outcomes Through exercise at rehab and home, the patient will decrease shortness of breath with daily activities and feel confident in carrying out an exercise regimen at home Through exercise at rehab and home, the patient will decrease shortness of breath with daily activities and feel confident in carrying out an exercise regimen at home               Discharge Exercise Prescription (Final Exercise Prescription Changes):  Exercise Prescription Changes - 07/11/23 0931       Response to Exercise   Blood Pressure (Admit) 114/60    Blood Pressure (Exit) 122/64    Heart Rate (Admit) 97 bpm    Heart Rate (Exercise) 101 bpm    Heart  Rate (Exit) 89 bpm    Oxygen Saturation (Admit) 95 %    Oxygen Saturation (Exercise) 93 %    Oxygen Saturation (Exit) 86 %    Rating of Perceived Exertion (Exercise) 13    Perceived Dyspnea (Exercise) 2    Duration Continue with 30 min of aerobic exercise without signs/symptoms of physical distress.    Intensity THRR unchanged      Progression   Progression Continue to progress  workloads to maintain intensity without signs/symptoms of physical distress.      Resistance Training   Training Prescription Yes    Weight blue    Reps 10-15    Time 10 Minutes      Interval Training   Interval Training No      Oxygen   Oxygen Continuous    Liters 2      NuStep   Level 3    Minutes 15    METs 2.4      Recumbant Elliptical   Level 2    Minutes 15    METs 3      Oxygen   Maintain Oxygen Saturation 88% or higher             Nutrition:  Target Goals: Understanding of nutrition guidelines, daily intake of sodium 1500mg , cholesterol 200mg , calories 30% from fat and 7% or less from saturated fats, daily to have 5 or more servings of fruits and vegetables.  Biometrics:  Pre Biometrics - 06/03/23 1443       Pre Biometrics   Grip Strength 20 kg              Nutrition Therapy Plan and Nutrition Goals:  Nutrition Therapy & Goals - 07/09/23 1357       Nutrition Therapy   Diet Heart Healthy Diet      Personal Nutrition Goals   Nutrition Goal Patient to improve diet quality by using the plate method as a guide for meal planning to include lean protein/plant protein, fruits, vegetables, whole grains, nonfat dairy as part of a well-balanced diet.   goal in progress.   Comments Goal in progress. [name] has medical history of IPF, pulmonary embolism (on eliquis), OSA, HTN.She has recently stopped taking pirfenidone over the last two weeks due to side effects of dizziness; she reports that this will be re-evaluated at follow-up in November. She continues repatha for elevated lipid panel. We have discussed multiple strategies for weight loss including the plate method, calorie density, benefits of high fiber/high protein intake, etc. She continues to work on decreasing sweets/refined carbohydrates. She does plan to inquire about GLP-1 for weight loss with PCP. She is down 1.1# since starting with our program. Patient will benefit from participation in  pulmonary rehab for nutrition, exercise, and lifestyle modification.      Intervention Plan   Intervention Prescribe, educate and counsel regarding individualized specific dietary modifications aiming towards targeted core components such as weight, hypertension, lipid management, diabetes, heart failure and other comorbidities.;Nutrition handout(s) given to patient.    Expected Outcomes Short Term Goal: Understand basic principles of dietary content, such as calories, fat, sodium, cholesterol and nutrients.;Long Term Goal: Adherence to prescribed nutrition plan.             Nutrition Assessments:  Nutrition Assessments - 06/11/23 1558       Rate Your Plate Scores   Pre Score 61            MEDIFICTS Score Key: >=70 Need to make dietary  changes  40-70 Heart Healthy Diet <= 40 Therapeutic Level Cholesterol Diet  Flowsheet Row PULMONARY REHAB OTHER RESPIRATORY from [facility]/11/2023 in [facility]  Picture Your Plate Total Score on Admission 61      Picture Your Plate Scores: <40 Unhealthy dietary pattern with much room for improvement. 41-50 Dietary pattern unlikely to meet recommendations for good health and room for improvement. 51-60 More healthful dietary pattern, with some room for improvement.  >60 Healthy dietary pattern, although there may be some specific behaviors that could be improved.    Nutrition Goals Re-Evaluation:  Nutrition Goals Re-Evaluation     Row Name 06/11/23 1358 07/09/23 1357           Goals   Current Weight 221 lb 12.5 oz (100.6 kg) 219 lb 9.3 oz (99.6 kg)      Comment cholesterol 229, LDL 132, A1c WNL no new labs; most recent labs  cholesterol 229, LDL 132, A1c WNL      Expected Outcome [name] has medical history of IPF, pulmonary embolism (on eliquis), OSA, HTN.She has recently stopped taking pirfenidone over the last two weeks due to side effects of dizziness; she reports that this will  be re-evaluated at follow-up in November. She continues repatha for elevated lipid panel. She reports motivation to improve eating habits by increasing fiber intake from vegetables and decreasing sweets. Patient will benefit from participation in pulmonary rehab for nutrition, exercise, and lifestyle modification. Goal in progress. [name] has medical history of IPF, pulmonary embolism (on eliquis), OSA, HTN.She has recently stopped taking pirfenidone over the last two weeks due to side effects of dizziness; she reports that this will be re-evaluated at follow-up in November. She continues repatha for elevated lipid panel. We have discussed multiple strategies for weight loss including the plate method, calorie density, benefits of high fiber/high protein intake, etc. She continues to work on decreasing sweets/refined carbohydrates. She does plan to inquire about GLP-1 for weight loss with PCP. She is down 1.1# since starting with our program. Patient will benefit from participation in pulmonary rehab for nutrition, exercise, and lifestyle modification.               Nutrition Goals Discharge (Final Nutrition Goals Re-Evaluation):  Nutrition Goals Re-Evaluation - 07/09/23 1357       Goals   Current Weight 219 lb 9.3 oz (99.6 kg)    Comment no new labs; most recent labs  cholesterol 229, LDL 132, A1c WNL    Expected Outcome Goal in progress. [name] has medical history of IPF, pulmonary embolism (on eliquis), OSA, HTN.She has recently stopped taking pirfenidone over the last two weeks due to side effects of dizziness; she reports that this will be re-evaluated at follow-up in November. She continues repatha for elevated lipid panel. We have discussed multiple strategies for weight loss including the plate method, calorie density, benefits of high fiber/high protein intake, etc. She continues to work on decreasing sweets/refined carbohydrates. She does plan to inquire about GLP-1 for weight loss with PCP.  She is down 1.1# since starting with our program. Patient will benefit from participation in pulmonary rehab for nutrition, exercise, and lifestyle modification.             Psychosocial: Target Goals: Acknowledge presence or absence of significant depression and/or stress, maximize coping skills, provide positive support system. Participant is able to verbalize types and ability to use techniques and skills needed for reducing stress and depression.  Initial Review & Psychosocial Screening:  Initial Psych Review & Screening - 06/03/23 1327       Initial Review   Current issues with Current Depression;History of Depression      Family Dynamics   Good Support System? Yes      Barriers   Psychosocial barriers to participate in program Psychosocial barriers identified (see note)      Screening Interventions   Interventions Encouraged to exercise;To provide support and resources with identified psychosocial needs    Expected Outcomes Short Term goal: Utilizing psychosocial counselor, staff and physician to assist with identification of specific Stressors or current issues interfering with healing process. Setting desired goal for each stressor or current issue identified.;Long Term Goal: Stressors or current issues are controlled or eliminated.;Short Term goal: Identification and review with participant of any Quality of Life or Depression concerns found by scoring the questionnaire.;Long Term goal: The participant improves quality of Life and PHQ9 Scores as seen by post scores and/or verbalization of changes             Quality of Life Scores:  Scores of 19 and below usually indicate a poorer quality of life in these areas.  A difference of  2-3 points is a clinically meaningful difference.  A difference of 2-3 points in the total score of the Quality of Life Index has been associated with significant improvement in overall quality of life, self-image, physical symptoms, and general  health in studies assessing change in quality of life.  PHQ-9: Review Flowsheet  More data exists      07/16/2023 06/18/2023 06/03/2023 05/07/2023 04/16/2023  Depression screen PHQ 2/9  Decreased Interest 1 1 2 1 1   Down, Depressed, Hopeless 1 1 2 1 1   PHQ - 2 Score 2 2 4 2 2   Altered sleeping - 1 3 1 1   Tired, decreased energy 1 1 2 1 1   Change in appetite 1 1 2  0 0  Feeling bad or failure about yourself  1 1 2 1 1   Trouble concentrating 1 1 1 1 1   Moving slowly or fidgety/restless 1 1 1 1 1   Suicidal thoughts 0 0 0 0 0  PHQ-9 Score - 8 15 7 7   Difficult doing work/chores Somewhat difficult Somewhat difficult Somewhat difficult Somewhat difficult Somewhat difficult    Details           Interpretation of Total Score  Total Score Depression Severity:  1-4 = Minimal depression, 5-9 = Mild depression, 10-14 = Moderate depression, 15-19 = Moderately severe depression, 20-27 = Severe depression   Psychosocial Evaluation and Intervention:  Psychosocial Evaluation - 06/03/23 1329       Psychosocial Evaluation & Interventions   Interventions Encouraged to exercise with the program and follow exercise prescription    Comments Tamirra's pre PHQ9 was 15. She currently feels depressed and is frustrated becuase she does not know the cause of her IPF. She has an appointment with her primary doctor to address her current depression.    Expected Outcomes For [name] to participate in PR free of any psychosocial barriers or concerns.    Continue Psychosocial Services  Follow up required by staff             Psychosocial Re-Evaluation:  Psychosocial Re-Evaluation     Row Name 06/14/23 1224 07/12/23 1318           Psychosocial Re-Evaluation   Current issues with Current Depression;History of Depression Current Depression;History of Depression  Comments No changes since orientation. [name] has attended 2 sessions so far. We will continue to monitor and assess throughout the program.  Psychosocial monthly re-evaluation is as follows: [name] is still concerned about her IPF. She is currently taking Pirfenidone and is tolerating it well. She informed us last month that she had an appointment with her MD and is now currently seeing a therapist. [name] denied any needs at this time.      Expected Outcomes For Maebh to participate in PR free of any psychosocial barriers or concerns. For [name] to participate in PR free of any psychosocial barriers or concerns.      Interventions Encouraged to attend Pulmonary Rehabilitation for the exercise Encouraged to attend Pulmonary Rehabilitation for the exercise      Continue Psychosocial Services  Follow up required by counselor No Follow up required               Psychosocial Discharge (Final Psychosocial Re-Evaluation):  Psychosocial Re-Evaluation - 07/12/23 1318       Psychosocial Re-Evaluation   Current issues with Current Depression;History of Depression    Comments Psychosocial monthly re-evaluation is as follows: [name] is still concerned about her IPF. She is currently taking Pirfenidone and is tolerating it well. She informed us last month that she had an appointment with her MD and is now currently seeing a therapist. [name] denied any needs at this time.    Expected Outcomes For [name] to participate in PR free of any psychosocial barriers or concerns.    Interventions Encouraged to attend Pulmonary Rehabilitation for the exercise    Continue Psychosocial Services  No Follow up required             Education: Education Goals: Education classes will be provided on a weekly basis, covering required topics. Participant will state understanding/return demonstration of topics presented.  Learning Barriers/Preferences:  Learning Barriers/Preferences - 06/03/23 1329       Learning Barriers/Preferences   Learning Barriers Sight   needs cataract surgery on the left eye   Learning Preferences Written Material              Education Topics: Know Your Numbers Group instruction that is supported by a PowerPoint presentation. Instructor discusses importance of knowing and understanding resting, exercise, and post-exercise oxygen saturation, heart rate, and blood pressure. Oxygen saturation, heart rate, blood pressure, rating of perceived exertion, and dyspnea are reviewed along with a normal range for these values.    Exercise for the Pulmonary Patient Group instruction that is supported by a PowerPoint presentation. Instructor discusses benefits of exercise, core components of exercise, frequency, duration, and intensity of an exercise routine, importance of utilizing pulse oximetry during exercise, safety while exercising, and options of places to exercise outside of rehab.    MET Level  Group instruction provided by PowerPoint, verbal discussion, and written material to support subject matter. Instructor reviews what METs are and how to increase METs.  Flowsheet Row PULMONARY REHAB OTHER RESPIRATORY from 07/11/2023 in [facility]  Date 07/11/23  Educator EP  Instruction Review Code 1- Verbalizes Understanding       Pulmonary Medications Verbally interactive group education provided by instructor with focus on inhaled medications and proper administration.   Anatomy and Physiology of the Respiratory System Group instruction provided by PowerPoint, verbal discussion, and written material to support subject matter. Instructor reviews respiratory cycle and anatomical components of the respiratory system and their functions. Instructor  also reviews differences in obstructive and restrictive respiratory diseases with examples of each.    Oxygen Safety Group instruction provided by PowerPoint, verbal discussion, and written material to support subject matter. There is an overview of "What is Oxygen" and "Why do we need it".  Instructor also reviews  how to create a safe environment for oxygen use, the importance of using oxygen as prescribed, and the risks of noncompliance. There is a brief discussion on traveling with oxygen and resources the patient may utilize.   Oxygen Use Group instruction provided by PowerPoint, verbal discussion, and written material to discuss how supplemental oxygen is prescribed and different types of oxygen supply systems. Resources for more information are provided.    Breathing Techniques Group instruction that is supported by demonstration and informational handouts. Instructor discusses the benefits of pursed lip and diaphragmatic breathing and detailed demonstration on how to perform both.  Flowsheet Row PULMONARY REHAB OTHER RESPIRATORY from 06/13/2023 in [facility]  Date 06/13/23  Educator RN  Instruction Review Code 1- Verbalizes Understanding        Risk Factor Reduction Group instruction that is supported by a PowerPoint presentation. Instructor discusses the definition of a risk factor, different risk factors for pulmonary disease, and how the heart and lungs work together. Flowsheet Row PULMONARY REHAB OTHER RESPIRATORY from 07/04/2023 in [facility], [facility]  Date 07/04/23  Educator EP  Instruction Review Code 1- Verbalizes Understanding       Pulmonary Diseases Group instruction provided by PowerPoint, verbal discussion, and written material to support subject matter. Instructor gives an overview of the different type of pulmonary diseases. There is also a discussion on risk factors and symptoms as well as ways to manage the diseases.   Stress and Energy Conservation Group instruction provided by PowerPoint, verbal discussion, and written material to support subject matter. Instructor gives an overview of stress and the impact it can have on the body. Instructor also reviews ways  to reduce stress. There is also a discussion on energy conservation and ways to conserve energy throughout the day. Flowsheet Row PULMONARY REHAB OTHER RESPIRATORY from 06/20/2023 in [facility] for Heart, Vascular, & Lung Health  Date 06/20/23  Educator RN  Instruction Review Code 1- Verbalizes Understanding       Warning Signs and Symptoms Group instruction provided by PowerPoint, verbal discussion, and written material to support subject matter. Instructor reviews warning signs and symptoms of stroke, heart attack, cold and flu. Instructor also reviews ways to prevent the spread of infection.   Other Education Group or individual verbal, written, or video instructions that support the educational goals of the pulmonary rehab program.    Knowledge Questionnaire Score:  Knowledge Questionnaire Score - 06/03/23 1353       Knowledge Questionnaire Score   Pre Score 13/18             Core Components/Risk Factors/Patient Goals at Admission:  Personal Goals and Risk Factors at Admission - 06/03/23 1330       Core Components/Risk Factors/Patient Goals on Admission    Weight Management Yes;Weight Loss    Intervention Weight Management: Develop a combined nutrition and exercise program designed to reach desired caloric intake, while maintaining appropriate intake of nutrient and fiber, sodium and fats, and appropriate energy expenditure required for the weight goal.;Weight Management: Provide education and appropriate resources to help participant work on and  attain dietary goals.;Weight Management/Obesity: Establish reasonable short term and long term weight goals.;Obesity: Provide education and appropriate resources to help participant work on and attain dietary goals.    Expected Outcomes Short Term: Continue to assess and modify interventions until short term weight is achieved;Long Term: Adherence to nutrition and physical activity/exercise program aimed  toward attainment of established weight goal;Weight Loss: Understanding of general recommendations for a balanced deficit meal plan, which promotes 1-2 lb weight loss per week and includes a negative energy balance of [phone] kcal/d;Understanding recommendations for meals to include 15-35% energy as protein, 25-35% energy from fat, 35-60% energy from carbohydrates, less than 200mg  of dietary cholesterol, 20-35 gm of total fiber daily;Understanding of distribution of calorie intake throughout the day with the consumption of 4-5 meals/snacks    Improve shortness of breath with ADL's Yes    Intervention Provide education, individualized exercise plan and daily activity instruction to help decrease symptoms of SOB with activities of daily living.    Expected Outcomes Short Term: Improve cardiorespiratory fitness to achieve a reduction of symptoms when performing ADLs;Long Term: Be able to perform more ADLs without symptoms or delay the onset of symptoms             Core Components/Risk Factors/Patient Goals Review:   Goals and Risk Factor Review     Row Name 06/14/23 1227 07/12/23 1432           Core Components/Risk Factors/Patient Goals Review   Personal Goals Review Weight Management/Obesity;Improve shortness of breath with ADL's;Develop more efficient breathing techniques such as purse lipped breathing and diaphragmatic breathing and practicing self-pacing with activity. Weight Management/Obesity;Improve shortness of breath with ADL's      Review Unable to assess goals yet. [name] has attended 2 sessions so far. We will continue to monitor her progress throughout the program. Core components/risk factors/patient goals monthly re-evaluate are as follows: Goal in progress for weight loss. [name] is working with our dietician and is trying to make small changes. She is down ~1.1# since starting. She is motivated to lose weight and plan to speak to her MD about a GLP-1. Goal progressing on improving  her shortness of breath with ADLs. [name] is slowly making progress on improving both her workload and METs. She has been in the program for about 5 weeks. Her oxygen saturation has remained WNL on 2L [place]. [name] informed us that she recently had a sleep test and now needs to wear 2L at night with her Cpap. She is hoping this will increase her energy and get more restful sleep. Goal met on developing more efficient breathing techniques such as purse lipped breathing and diaphragmatic breathing and practicing self-pacing with activity. [name] can initiate PLB on her own without staff intervention and she has been practicing diaphragmatic breathing at home. She is able to listen to her body, rest, and slow her pace when she begins to become short or breath. We will continue to evaluate and assist [name] with goals in [facility]. She will continue to benefit from PR for nutrition, education, exercise, and lifestyle modification.      Expected Outcomes For [name] to lose weight, improve shortness of breath with ADLs, and develop more efficient breathing techniques such as purse lipped breathing and diaphragmatic breathing; and practicing self-pacing with activity For [name] to lose weight and improve shortness of breath with ADLs               Core Components/Risk Factors/Patient Goals at Discharge (Final Review):  Goals and Risk Factor Review - 07/12/23 1432       Core Components/Risk Factors/Patient Goals Review   Personal Goals Review Weight Management/Obesity;Improve shortness of breath with ADL's    Review Core components/risk factors/patient goals monthly re-evaluate are as follows: Goal in progress for weight loss. Mateo is working with our dietician and is trying to make small changes. She is down ~1.1# since starting. She is motivated to lose weight and plan to speak to her MD about a GLP-1. Goal progressing on improving her shortness of breath with ADLs. [name] is slowly making progress on improving  both her workload and METs. She has been in the program for about 5 weeks. Her oxygen saturation has remained WNL on 2L [place]. [name] informed us that she recently had a sleep test and now needs to wear 2L at night with her Cpap. She is hoping this will increase her energy and get more restful sleep. Goal met on developing more efficient breathing techniques such as purse lipped breathing and diaphragmatic breathing and practicing self-pacing with activity. [name] can initiate PLB on her own without staff intervention and she has been practicing diaphragmatic breathing at home. She is able to listen to her body, rest, and slow her pace when she begins to become short or breath. We will continue to evaluate and assist [name] with goals in [facility]. She will continue to benefit from PR for nutrition, education, exercise, and lifestyle modification.    Expected Outcomes For Lyani to lose weight and improve shortness of breath with ADLs             ITP Comments: Pt is making expected progress toward Pulmonary Rehab goals after completing 10 session(s). Recommend continued exercise, life style modification, education, and utilization of breathing techniques to increase stamina and strength, while also decreasing shortness of breath with exertion.  Dr. Mechele Collin is Medical Director for Pulmonary Rehab at [facility].

## 2023-07-19 ENCOUNTER — Encounter: Payer: Self-pay | Admitting: Pharmacist

## 2023-07-19 NOTE — Progress Notes (Unsigned)
Received referral from [facility] for patient assistance screening from LCSW. Symbicort and Jardiance are prescribed by [facility] speciality practices. Sending referral to Patient Advocate Team with [facility].    Reynold Bowen, PharmD Clinical Pharmacist [place] Direct Dial: [phone]

## 2023-07-22 ENCOUNTER — Ambulatory Visit: Payer: Medicare Other | Admitting: Podiatry

## 2023-07-23 ENCOUNTER — Encounter (HOSPITAL_COMMUNITY)
Admission: RE | Admit: 2023-07-23 | Discharge: 2023-07-23 | Disposition: A | Discharge status: Home / Self Care | Payer: Medicare Other | Source: Ambulatory Visit | Attending: Internal Medicine | Admitting: Internal Medicine

## 2023-07-23 DIAGNOSIS — J84112 Idiopathic pulmonary fibrosis: Secondary | ICD-10-CM | POA: Insufficient documentation

## 2023-07-23 DIAGNOSIS — Z5189 Encounter for other specified aftercare: Secondary | ICD-10-CM | POA: Diagnosis not present

## 2023-07-23 NOTE — Progress Notes (Signed)
Daily Session Note  Patient Details  Name: [name] MRN: [id number] Date of Birth: 27-May-1951 Referring Provider:   Doristine Devoid Pulmonary Rehab Walk Test from 06/03/2023 in [facility] for [facility], Vascular, & Lung [facility]  Referring Provider Ramaswamy       Encounter Date: 07/23/2023  Check In:  Session Check In - 07/23/23 1425       Check-In   Supervising physician immediately available to respond to emergencies [facility] MD immediately available    Physician(s) Eligha Bridegroom, NP    Location [facility]    Staff Present Raford Pitcher, MS, ACSM-CEP, Exercise Physiologist;Randi Dionisio Paschal, ACSM-CEP, Exercise Physiologist;Aliscia Clayton Gerre Scull, RN, [employer], MS, ACSM-CEP, CCRP, Exercise Physiologist    Virtual Visit No    Medication changes reported     No   started Esbriet again, x3 qd   Fall or balance concerns reported    No    Tobacco Cessation No Change    Warm-up and Cool-down Performed as group-led instruction    Resistance Training Performed Yes    VAD Patient? No    PAD/SET Patient? No      Pain Assessment   Currently in Pain? No/denies    Pain Score 0-No pain    Multiple Pain Sites No             Capillary Blood Glucose: No results found. However, due to the size of the patient record, not all encounters were searched. Please check Results Review for a complete set of results.    Social History   Tobacco Use  Smoking Status Former   Current packs/day: 0.00   Average packs/day: 1 pack/day for 20.0 years (20.0 ttl pk-yrs)   Types: Cigarettes   Start date: 08/20/1969   Quit date: 08/20/1989   Years since quitting: 33.9   Passive exposure: Past  Smokeless Tobacco Never    Goals Met:  Independence with exercise equipment Exercise tolerated well No report of concerns or symptoms today Strength training completed today  Goals Unmet:  Not Applicable  Comments: Service time is from 1310 to 1432    Dr. Mechele Collin is Medical Director for Pulmonary Rehab at [facility].

## 2023-07-25 ENCOUNTER — Encounter (HOSPITAL_COMMUNITY)
Admission: RE | Admit: 2023-07-25 | Discharge: 2023-07-25 | Disposition: A | Discharge status: Home / Self Care | Payer: Medicare Other | Source: Ambulatory Visit | Attending: Internal Medicine | Admitting: Internal Medicine

## 2023-07-25 ENCOUNTER — Other Ambulatory Visit: Payer: Self-pay

## 2023-07-25 ENCOUNTER — Encounter: Payer: Self-pay | Admitting: Podiatry

## 2023-07-25 ENCOUNTER — Ambulatory Visit: Payer: Medicare Other | Admitting: Podiatry

## 2023-07-25 DIAGNOSIS — D689 Coagulation defect, unspecified: Secondary | ICD-10-CM | POA: Diagnosis not present

## 2023-07-25 DIAGNOSIS — L84 Corns and callosities: Secondary | ICD-10-CM

## 2023-07-25 DIAGNOSIS — J84112 Idiopathic pulmonary fibrosis: Secondary | ICD-10-CM | POA: Diagnosis not present

## 2023-07-25 NOTE — Progress Notes (Signed)
Home Exercise Prescription I have reviewed a Home Exercise Prescription with [name]. She started exercising this week at the [facility] and is doing 3 nonrehab days a week, 30 min on the the recumbent elliptical. Congratulated pt and encouraged her to continue. She will need 1L O2 while exercising. Encouraged her to increase workload as tolerated. The patient stated that their goals were to be able to walk for exercise. She needs to see her PCP about hip pain before we begin working on this goal in [place]. We reviewed exercise guidelines, target heart rate during exercise, RPE Scale, weather conditions, endpoints for exercise, warmup and cool down. The patient is encouraged to come to me with any questions. I will continue to follow up with the patient to assist them with progression and safety.  Spent 15 min discussing home exercise plan and goals.  [name] [place], [place], ACSM-CEP 07/25/2023 3:41 PM

## 2023-07-25 NOTE — Progress Notes (Signed)
Daily Session Note  Patient Details  Name: [name] MRN: [id number] Date of Birth: Jan 25, 1951 Referring Provider:   Doristine Devoid Pulmonary Rehab Walk Test from 06/03/2023 in [facility] for Heart, Vascular, & Lung Health  Referring Provider Ramaswamy       Encounter Date: 07/25/2023  Check In:  Session Check In - 07/25/23 1343       Check-In   Supervising physician immediately available to respond to emergencies [facility] MD immediately available    Physician(s) Edd Fabian, NP    Location [facility]    Staff Present Raford Pitcher, MS, ACSM-CEP, Exercise Physiologist;Randi Dionisio Paschal, ACSM-CEP, Exercise Physiologist;Mary Gerre Scull, RN, Fuller Plan, RT    Virtual Visit No    Medication changes reported     No   started Esbriet again, x3 qd   Fall or balance concerns reported    No    Tobacco Cessation No Change    Warm-up and Cool-down Performed as group-led instruction    Resistance Training Performed Yes    VAD Patient? No    PAD/SET Patient? No      Pain Assessment   Currently in Pain? No/denies    Multiple Pain Sites No             Capillary Blood Glucose: No results found. However, due to the size of the patient record, not all encounters were searched. Please check Results Review for a complete set of results.    Social History   Tobacco Use  Smoking Status Former   Current packs/day: 0.00   Average packs/day: 1 pack/day for 20.0 years (20.0 ttl pk-yrs)   Types: Cigarettes   Start date: 08/20/1969   Quit date: 08/20/1989   Years since quitting: 33.9   Passive exposure: Past  Smokeless Tobacco Never    Goals Met:  Proper associated with RPD/PD & O2 Sat Independence with exercise equipment Exercise tolerated well No report of concerns or symptoms today Strength training completed today  Goals Unmet:  Not Applicable  Comments: Service time is from 1319 to 1440.    Dr. Mechele Collin is Medical Director  for Pulmonary Rehab at [facility].

## 2023-07-25 NOTE — Progress Notes (Signed)
Specialty Pharmacy Refill Coordination Note  Devette Gabin is a 72 y.o. female contacted today regarding refills of specialty medication(s) Pirfenidone   Patient requested Delivery   Delivery date: 08/01/23   Verified address: [address]   Medication will be filled on 07/31/23.

## 2023-07-25 NOTE — Progress Notes (Signed)
Subjective:   Patient ID: [name], female   DOB: 72 y.o.   MRN: [id number]   HPI Patient presents stating that she has severe lesion formation bilateral painful   ROS      Objective:  Physical Exam  Neurovascular status intact patient on blood thinner with thick keratotic lesions plantar aspect both feet painful     Assessment:  Chronic lesion formation bilateral at risk patient on blood thinner     Plan:  H&P reviewed [facility] sterile debridement of lesions bilateral no iatrogenic bleeding reappoint routine care

## 2023-07-25 NOTE — Progress Notes (Signed)
Specialty Pharmacy Ongoing Clinical Assessment Note  [name] is a 72 y.o. female who is being followed by the specialty pharmacy service for RxSp Interstitial Lung Disease   Patient's specialty medication(s) reviewed today: Pirfenidone   Missed doses in the last 4 weeks: 0   Patient/Caregiver did not have any additional questions or concerns.   Therapeutic benefit summary: Unable to assess   Adverse events/side effects summary: No adverse events/side effects   Patient's therapy is appropriate to: Continue    Goals Addressed             This Visit's Progress    Stabilization of disease       Patient is on track. Patient will maintain adherence         Follow up:  6 months  Bobette Mo Specialty Pharmacist

## 2023-07-30 ENCOUNTER — Encounter (HOSPITAL_COMMUNITY)
Admission: RE | Admit: 2023-07-30 | Discharge: 2023-07-30 | Disposition: A | Discharge status: Home / Self Care | Payer: Medicare Other | Source: Ambulatory Visit | Attending: Internal Medicine

## 2023-07-30 VITALS — Wt 218.0 lb

## 2023-07-30 DIAGNOSIS — J84112 Idiopathic pulmonary fibrosis: Secondary | ICD-10-CM | POA: Diagnosis not present

## 2023-07-30 NOTE — Progress Notes (Signed)
Daily Session Note  Patient Details  Name: [name] MRN: [id number] Date of Birth: [birth year] Referring Provider:   Doristine Devoid Pulmonary Rehab Walk Test from 06/03/2023 in [facility] for Heart, Vascular, & Lung Health  Referring Provider Ramaswamy       Encounter Date: 07/30/2023  Check In:  Session Check In - 07/30/23 1320       Check-In   Supervising physician immediately available to respond to emergencies [facility] immediately available    Physician(s) Carlyon Shadow, NP    [facility]    Staff Present Raford Pitcher, MS, ACSM-CEP, Exercise Physiologist;Randi Dionisio Paschal, ACSM-CEP, Exercise Physiologist;Mary Gerre Scull, RN, Fuller Plan, RT    Virtual Visit No    Medication changes reported     No   started Esbriet again, x3 qd   Fall or balance concerns reported    No    Tobacco Cessation No Change    Warm-up and Cool-down Performed as group-led instruction    Resistance Training Performed Yes    VAD Patient? No    PAD/SET Patient? No      Pain Assessment   Currently in Pain? No/denies    Multiple Pain Sites No             Capillary Blood Glucose: No results found. However, due to the size of the patient record, not all encounters were searched. Please check Results Review for a complete set of results.   Exercise Prescription Changes - 07/30/23 1400       Response to Exercise   Blood Pressure (Admit) 124/68    Blood Pressure (Exercise) 134/66    Blood Pressure (Exit) 112/66    Heart Rate (Admit) 91 bpm    Heart Rate (Exercise) 90 bpm    Heart Rate (Exit) 96 bpm    Oxygen Saturation (Admit) 91 %    Oxygen Saturation (Exercise) 90 %    Oxygen Saturation (Exit) 96 %    Rating of Perceived Exertion (Exercise) 13    Perceived Dyspnea (Exercise) 2    Duration Continue with 30 min of aerobic exercise without signs/symptoms of physical distress.    Intensity THRR unchanged      Progression    Progression Continue to progress workloads to maintain intensity without signs/symptoms of physical distress.      Resistance Training   Training Prescription Yes    Weight blue    Reps 10-15    Time 10 Minutes      Oxygen   Oxygen Continuous    Liters 0-2      NuStep   Level 4    SPM 90    Minutes 15    METs 2.41      Recumbant Elliptical   Level 3    RPM 47    Watts 57    Minutes 15    METs 2.1      Oxygen   Maintain Oxygen Saturation 88% or higher             Social History   Tobacco Use  Smoking Status Former   Current packs/day: 0.00   Average packs/day: 1 pack/day for 20.0 years (20.0 ttl pk-yrs)   Types: Cigarettes   Start date: 08/20/1969   Quit date: 08/20/1989   Years since quitting: 33.9   Passive exposure: Past  Smokeless Tobacco Never    Goals Met:  Proper associated with RPD/PD & O2 Sat Independence with exercise equipment Exercise tolerated well  No report of concerns or symptoms today Strength training completed today  Goals Unmet:  Not Applicable  Comments: Service time is from 1311 to 1435.    Dr. Mechele Collin is Medical Director for Pulmonary Rehab at [facility].

## 2023-07-31 ENCOUNTER — Other Ambulatory Visit: Payer: Self-pay

## 2023-08-01 ENCOUNTER — Encounter (HOSPITAL_COMMUNITY)
Admission: RE | Admit: 2023-08-01 | Discharge: 2023-08-01 | Disposition: A | Discharge status: Home / Self Care | Payer: Medicare Other | Source: Ambulatory Visit | Attending: Internal Medicine | Admitting: Internal Medicine

## 2023-08-01 DIAGNOSIS — J84112 Idiopathic pulmonary fibrosis: Secondary | ICD-10-CM | POA: Diagnosis not present

## 2023-08-01 NOTE — Progress Notes (Signed)
Daily Session Note  Patient Details  Name: [name] MRN: [id number] Date of Birth: 08-May-1951 Referring Provider:   Doristine Devoid Pulmonary Rehab Walk Test from 06/03/2023 in [facility] for Heart, Vascular, & Lung Health  Referring Provider Ramaswamy       Encounter Date: 08/01/2023  Check In:  Session Check In - 08/01/23 1325       Check-In   Supervising physician immediately available to respond to emergencies [facility] immediately available    Physician(s) Joni Reining, NP    Location [facility]    Staff Present Raford Pitcher, MS, ACSM-CEP, Exercise Physiologist;Leeba Barbe Dionisio Paschal, ACSM-CEP, Exercise Physiologist;Mary Gerre Scull, RN, Fuller Plan, RT    Virtual Visit No    Medication changes reported     No    Fall or balance concerns reported    No    Tobacco Cessation No Change    Warm-up and Cool-down Performed as group-led instruction    Resistance Training Performed Yes    VAD Patient? No    PAD/SET Patient? No             Capillary Blood Glucose: No results found. However, due to the size of the patient record, not all encounters were searched. Please check Results Review for a complete set of results.    Social History   Tobacco Use  Smoking Status Former   Current packs/day: 0.00   Average packs/day: 1 pack/day for 20.0 years (20.0 ttl pk-yrs)   Types: Cigarettes   Start date: 08/20/1969   Quit date: 08/20/1989   Years since quitting: 33.9   Passive exposure: Past  Smokeless Tobacco Never    Goals Met:  Independence with exercise equipment Exercise tolerated well No report of concerns or symptoms today Strength training completed today  Goals Unmet:  Not Applicable  Comments: Service time is from 1314 to 1440.    Dr. Mechele Collin is Medical Director for Pulmonary Rehab at [facility].

## 2023-08-06 ENCOUNTER — Encounter (HOSPITAL_COMMUNITY)
Admission: RE | Admit: 2023-08-06 | Discharge: 2023-08-06 | Disposition: A | Discharge status: Home / Self Care | Payer: Medicare Other | Source: Ambulatory Visit | Attending: Internal Medicine | Admitting: Internal Medicine

## 2023-08-06 DIAGNOSIS — J84112 Idiopathic pulmonary fibrosis: Secondary | ICD-10-CM

## 2023-08-06 NOTE — Progress Notes (Signed)
Daily Session Note  Patient Details  Name: [name] MRN: [id number] Date of Birth: [birth year] Referring Provider:   Doristine Devoid Pulmonary Rehab Walk Test from 06/03/2023 in [facility] for Heart, Vascular, & Lung Health  Referring Provider Ramaswamy       Encounter Date: 08/06/2023  Check In:  Session Check In - 08/06/23 1322       Check-In   Supervising physician immediately available to respond to emergencies [facility] immediately available    Physician(s) Bernadene Person, NP    Location [facility]    Staff Present Raford Pitcher, MS, ACSM-CEP, Exercise Physiologist;Randi Dionisio Paschal, ACSM-CEP, Exercise Physiologist;Laniesha Das Gerre Scull, RN, Fuller Plan, RT    Virtual Visit No    Medication changes reported     No    Fall or balance concerns reported    No    Tobacco Cessation No Change    Warm-up and Cool-down Performed as group-led instruction    Resistance Training Performed Yes    VAD Patient? No    PAD/SET Patient? No      Pain Assessment   Currently in Pain? No/denies    Multiple Pain Sites No             Capillary Blood Glucose: No results found. However, due to the size of the patient record, not all encounters were searched. Please check Results Review for a complete set of results.    Social History   Tobacco Use  Smoking Status Former   Current packs/day: 0.00   Average packs/day: 1 pack/day for 20.0 years (20.0 ttl pk-yrs)   Types: Cigarettes   Start date: 08/20/1969   Quit date: 08/20/1989   Years since quitting: 33.9   Passive exposure: Past  Smokeless Tobacco Never    Goals Met:  Independence with exercise equipment Exercise tolerated well No report of concerns or symptoms today Strength training completed today  Goals Unmet:  Not Applicable  Comments: Service time is from 1313 to 1441    Dr. Mechele Collin is Medical Director for Pulmonary Rehab at [facility].

## 2023-08-08 ENCOUNTER — Other Ambulatory Visit: Payer: Self-pay | Admitting: Interventional Cardiology

## 2023-08-08 ENCOUNTER — Encounter (HOSPITAL_COMMUNITY)
Admission: RE | Admit: 2023-08-08 | Discharge: 2023-08-08 | Disposition: A | Discharge status: Home / Self Care | Payer: Medicare Other | Source: Ambulatory Visit | Attending: Internal Medicine | Admitting: Internal Medicine

## 2023-08-08 VITALS — Wt 217.4 lb

## 2023-08-08 DIAGNOSIS — E785 Hyperlipidemia, unspecified: Secondary | ICD-10-CM

## 2023-08-08 DIAGNOSIS — J84112 Idiopathic pulmonary fibrosis: Secondary | ICD-10-CM | POA: Diagnosis not present

## 2023-08-08 DIAGNOSIS — M609 Myositis, unspecified: Secondary | ICD-10-CM

## 2023-08-08 DIAGNOSIS — I700 Atherosclerosis of aorta: Secondary | ICD-10-CM

## 2023-08-08 NOTE — Progress Notes (Signed)
Daily Session Note  Patient Details  Name: [name] MRN: [id number] Date of Birth: [birth year] Referring Provider:   Doristine Devoid Pulmonary Rehab Walk Test from 06/03/2023 in [facility] for Heart, Vascular, & Lung Health  Referring Provider Ramaswamy       Encounter Date: 08/08/2023  Check In:  Session Check In - 08/08/23 1330       Check-In   Supervising physician immediately available to respond to emergencies [facility] MD immediately available    Physician(s) Robin Searing, NP    Location [facility]    Staff Present Raford Pitcher, MS, ACSM-CEP, Exercise Physiologist;Randi Dionisio Paschal, ACSM-CEP, Exercise Physiologist;Mary Gerre Scull, RN, Fuller Plan, RT    Virtual Visit No    Medication changes reported     No    Fall or balance concerns reported    No    Tobacco Cessation No Change    Warm-up and Cool-down Performed as group-led instruction    Resistance Training Performed Yes    VAD Patient? No    PAD/SET Patient? No      Pain Assessment   Currently in Pain? No/denies    Pain Score 0-No pain    Multiple Pain Sites No             Capillary Blood Glucose: No results found. However, due to the size of the patient record, not all encounters were searched. Please check Results Review for a complete set of results.    Social History   Tobacco Use  Smoking Status Former   Current packs/day: 0.00   Average packs/day: 1 pack/day for 20.0 years (20.0 ttl pk-yrs)   Types: Cigarettes   Start date: 08/20/1969   Quit date: 08/20/1989   Years since quitting: 33.9   Passive exposure: Past  Smokeless Tobacco Never    Goals Met:  Proper associated with RPD/PD & O2 Sat Independence with exercise equipment Exercise tolerated well No report of concerns or symptoms today Strength training completed today  Goals Unmet:  Not Applicable  Comments: Service time is from 1313 to 1435.    Dr. Mechele Collin is Medical Director for  Pulmonary Rehab at [facility].

## 2023-08-12 ENCOUNTER — Encounter
Payer: Medicare Other | Attending: Physical Medicine and Rehabilitation | Admitting: Physical Medicine and Rehabilitation

## 2023-08-12 ENCOUNTER — Encounter: Payer: Self-pay | Admitting: Physical Medicine and Rehabilitation

## 2023-08-12 VITALS — BP 114/76 | HR 96 | Ht 65.5 in | Wt 218.4 lb

## 2023-08-12 DIAGNOSIS — M7521 Bicipital tendinitis, right shoulder: Secondary | ICD-10-CM

## 2023-08-12 DIAGNOSIS — M5442 Lumbago with sciatica, left side: Secondary | ICD-10-CM | POA: Diagnosis present

## 2023-08-12 DIAGNOSIS — M5441 Lumbago with sciatica, right side: Secondary | ICD-10-CM | POA: Insufficient documentation

## 2023-08-12 DIAGNOSIS — J471 Bronchiectasis with (acute) exacerbation: Secondary | ICD-10-CM | POA: Diagnosis present

## 2023-08-12 DIAGNOSIS — M7918 Myalgia, other site: Secondary | ICD-10-CM

## 2023-08-12 DIAGNOSIS — M7522 Bicipital tendinitis, left shoulder: Secondary | ICD-10-CM

## 2023-08-12 DIAGNOSIS — G8929 Other chronic pain: Secondary | ICD-10-CM | POA: Diagnosis present

## 2023-08-12 DIAGNOSIS — Z5181 Encounter for therapeutic drug level monitoring: Secondary | ICD-10-CM

## 2023-08-12 DIAGNOSIS — Z79891 Long term (current) use of opiate analgesic: Secondary | ICD-10-CM | POA: Diagnosis present

## 2023-08-12 DIAGNOSIS — G894 Chronic pain syndrome: Secondary | ICD-10-CM | POA: Diagnosis present

## 2023-08-12 MED ORDER — LIDOCAINE HCL 1 % IJ SOLN
5.0000 mL | Freq: Once | INTRAMUSCULAR | Status: AC
  Administered 2023-08-12: 5 mL

## 2023-08-12 MED ORDER — OXYCODONE HCL 5 MG PO TABS: 2.5000 mg | ORAL_TABLET | Freq: Two times a day (BID) | ORAL | 0 refills | Status: DC | PRN

## 2023-08-12 MED ORDER — BETAMETHASONE SOD PHOS & ACET 6 (3-3) MG/ML IJ SUSP
12.0000 mg | Freq: Once | INTRAMUSCULAR | Status: AC
  Administered 2023-08-12: 12 mg via INTRAMUSCULAR

## 2023-08-12 NOTE — Progress Notes (Signed)
Patient is a 72 yr old R handed female with hx of  Bronchiesctasis, CHF, prediabetes- with A1c of 5.6- as of 02/15/21- - not on blood thinners, has a defibrillator, GERD, and fibromyalgia issues- dx'd 10 years ago.  Here for f/u on chronic pain and trigger oint injections. Last got subacromial shoulder injections B/L on 03/29/21.  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  Here for f/u on pain.   Neck hurts so bad!  Cannot take much of anything for pain.   LFTs went back up and was told to NOT take Tylenol anymore.   Doing pulmonary rehab- 2x/week and goes ot gym other days/week.   Was given a SSRI- but didn't take, because scared would change personality.     Granddaughter on autism spectrum- lives with her-  Son had heart transplant    Plan:  Can go to tiny dose of Oxycodone- 2.5-5 mg up to 2x/day as needed for severe pain, since cannot take NSAIDs due to kidneys, cannot take Tramadol  due to seizure risk; on anti seizure meds; and cannot take tylenol due to increase in LFTs.    2. Patient here for trigger point injections for  Consent done and on chart.  Cleaned areas with alcohol and injected using a 27 gauge 1.5 inch needle  Injected 3cc- none wasted Using 1% Lidocaine with no EPI  Upper traps B/L  Levators- B/L  Posterior scalenes Middle scalenes- B/L  Splenius Capitus- B/L  Pectoralis Major Rhomboids- B/L  Infraspinatus Teres Major/minor Thoracic paraspinals Lumbar paraspinals Other injections-    Patient's level of pain prior was  5/10 Current level of pain after injections is - about the same  There was no bleeding or complications.  Patient was advised to drink a lot of water on day after injections to flush system Will have increased soreness for 12-48 hours after injections.  Can use Lidocaine patches the day AFTER injections Can use theracane on day of injections in places didn't inject Can use heating pad 4-6 hours  AFTER injections  3.  steroid injection was performed at bicipital tendons B/L using 1% plain Lidocaine and 40mg  /1cc of Kenalog. This was well tolerated.  Cleaned with betadine x3 and allowed to dry- then alcohol then injected using 27 gauge 1.5 inch needle- no bleeding or complications.    F/U in 3 months for steroid injections of shoulders/biceps tendons.  Lidocaine will kick in 15 minutes- and wear off tonight- the steroid will kick in tomorrow within 24 hours and take up to 72 hours to fully kick in.  4. Has opiate contract- but needs UDS- last done 08/2022  5. F/u in 3 months- with me for Trp injections and shoulder injections and Eunice in 6 weeks.     I spent a total of 38   minutes on total care today- >50% coordination of care- due to  4 minute spn trp injections; and 4 minutes on B/L shoulde rinjections- and then discussion about pain meds- options and decided on Oxy- not many choices- and risk/sbenfits- and d/w pt about no gummies and labs

## 2023-08-12 NOTE — Addendum Note (Signed)
Addended by: Doreene Eland on: 08/12/2023 04:08 PM   Modules accepted: Orders

## 2023-08-12 NOTE — Patient Instructions (Signed)
Plan:  Can go to tiny dose of Oxycodone- 2.5-5 mg up to 2x/day as needed for severe pain, since cannot take NSAIDs due to kidneys, cannot take Tramadol  due to seizure risk; on anti seizure meds; and cannot take tylenol due to increase in LFTs.    2. Patient here for trigger point injections for  Consent done and on chart.  Cleaned areas with alcohol and injected using a 27 gauge 1.5 inch needle  Injected 3cc- none wasted Using 1% Lidocaine with no EPI  Upper traps B/L  Levators- B/L  Posterior scalenes Middle scalenes- B/L  Splenius Capitus- B/L  Pectoralis Major Rhomboids- B/L  Infraspinatus Teres Major/minor Thoracic paraspinals Lumbar paraspinals Other injections-    Patient's level of pain prior was  5/10 Current level of pain after injections is - about the same  There was no bleeding or complications.  Patient was advised to drink a lot of water on day after injections to flush system Will have increased soreness for 12-48 hours after injections.  Can use Lidocaine patches the day AFTER injections Can use theracane on day of injections in places didn't inject Can use heating pad 4-6 hours AFTER injections  3.  steroid injection was performed at bicipital tendons B/L using 1% plain Lidocaine and 40mg  /1cc of Kenalog. This was well tolerated.  Cleaned with betadine x3 and allowed to dry- then alcohol then injected using 27 gauge 1.5 inch needle- no bleeding or complications.    F/U in 3 months for steroid injections of shoulders/biceps tendons.  Lidocaine will kick in 15 minutes- and wear off tonight- the steroid will kick in tomorrow within 24 hours and take up to 72 hours to fully kick in.  4. Has opiate contract- but needs UDS- last done 08/2022  5. F/u in 3 months- with me for Trp injections and shoulder injections and Eunice in 6 weeks.

## 2023-08-13 ENCOUNTER — Encounter (HOSPITAL_COMMUNITY): Payer: Medicare Other

## 2023-08-13 NOTE — Progress Notes (Signed)
Pulmonary Individual Treatment Plan  Patient Details  Name: [name] MRN: [id number] Date of Birth: 1951/06/22 Referring Provider:   Doristine [name] Pulmonary Rehab Walk Test from 06/03/2023 in [name]  Referring Provider [name]       Initial Encounter Date:  Flowsheet Row Pulmonary Rehab Walk Test from 06/03/2023 in [name]  Date 06/03/23       Visit Diagnosis: IPF (idiopathic pulmonary fibrosis) (HCC)  Patient's Home Medications on Admission:   Current Outpatient Medications:    albuterol (VENTOLIN HFA) 108 (90 Base) MCG/ACT inhaler, Inhale 2 puffs into the lungs every 6 (six) hours as needed for wheezing or shortness of breath., Disp: 51 each, Rfl: 1   apixaban (ELIQUIS) 5 MG TABS tablet, Take 1 tablet (5 mg total) by mouth 2 (two) times daily., Disp: 180 tablet, Rfl: 1   azelastine (ASTELIN) 0.1 % nasal spray, Place 1 spray into both nostrils 2 (two) times daily. Use in each nostril as directed, Disp: 90 mL, Rfl: 3   benzonatate (TESSALON) 200 MG capsule, Take 1 capsule (200 mg total) by mouth 3 (three) times daily as needed for cough., Disp: 90 capsule, Rfl: 2   budesonide-formoterol (SYMBICORT) 80-4.5 MCG/ACT inhaler, Inhale 1 puff into the lungs 2 (two) times daily., Disp: 3 each, Rfl: 4   Cholecalciferol (DIALYVITE VITAMIN D 5000) 125 MCG (5000 UT) capsule, Take 5,000 Units by mouth daily., Disp: , Rfl:    cloBAZam (ONFI) 10 MG tablet, Take 1 tablet (10 mg total) by mouth 2 (two) times daily., Disp: 180 tablet, Rfl: 1   diltiazem (CARDIZEM CD) 120 MG 24 hr capsule, Take 1 capsule (120 mg total) by mouth daily., Disp: 90 capsule, Rfl: 3   empagliflozin (JARDIANCE) 10 MG TABS tablet, Take 1 tablet (10 mg total) by mouth daily., Disp: 90 tablet, Rfl: 3   famotidine (PEPCID) 40 MG tablet, TAKE 1 TABLET AT BEDTIME (NEED MD APPOINTMENT FOR REFILLS),  Disp: 90 tablet, Rfl: 3   fexofenadine (ALLEGRA) 180 MG tablet, Take 180 mg by mouth daily as needed for allergies (alternates with zyrtec sometimes)., Disp: , Rfl:    fluticasone (FLONASE) 50 MCG/ACT nasal spray, Place 2 sprays into both nostrils daily as needed for allergies or rhinitis., Disp: 48 g, Rfl: 3   FOLIC ACID PO, Take 1 tablet by mouth daily., Disp: , Rfl:    furosemide (LASIX) 20 MG tablet, TAKE 2 TABLETS (40 MG) 3 DAYS PER WEEK AND TAKE 1 TABLET (20 MG) THE OTHER DAYS OF THE WEEK, Disp: 130 tablet, Rfl: 2   hydroquinone 4 % cream, Apply 1 Application topically at bedtime., Disp: , Rfl:    hydroxypropyl methylcellulose / hypromellose (ISOPTO TEARS / GONIOVISC) 2.5 % ophthalmic solution, Place 1 drop into both eyes at bedtime., Disp: , Rfl:    latanoprost (XALATAN) 0.005 % ophthalmic solution, Place 1 drop into both eyes at bedtime., Disp: , Rfl:    levocetirizine (XYZAL) 5 MG tablet, Take 5 mg by mouth daily at 12 noon., Disp: , Rfl:    levothyroxine (SYNTHROID) 50 MCG tablet, TAKE 1 TABLET EVERY DAY, Disp: 90 tablet, Rfl: 3   magnesium oxide (MAG-OX) 400 (240 Mg) MG tablet, TAKE 1 TABLET TWICE DAILY, Disp: 180 tablet, Rfl: 3   montelukast (SINGULAIR) 10 MG tablet, TAKE 1 TABLET AT BEDTIME, Disp: 90 tablet, Rfl: 3   Multiple Vitamins-Minerals (MULTIVITAMIN PO), Take 1 tablet  by mouth daily., Disp: , Rfl:    oxyCODONE (ROXICODONE) 5 MG immediate release tablet, Take 0.5-1 tablets (2.5-5 mg total) by mouth 2 (two) times daily as needed for severe pain (pain score 7-10)., Disp: 45 tablet, Rfl: 0   pantoprazole (PROTONIX) 40 MG tablet, TAKE 1 TABLET EVERY DAY, Disp: 90 tablet, Rfl: 3   Pirfenidone 267 MG TABS, Take 3 tablets (801 mg total) by mouth 3 (three) times daily with meals., Disp: 270 tablet, Rfl: 4   polyethylene glycol powder (GLYCOLAX/MIRALAX) powder, Take 17 grams by mouth daily, Disp: 1080 g, Rfl: 0   potassium chloride (KLOR-CON M) 10 MEQ tablet, TAKE 1/2 TABLET EVERY DAY,  Disp: 45 tablet, Rfl: 2   promethazine-dextromethorphan (PROMETHAZINE-DM) 6.25-15 MG/5ML syrup, Take 5 mLs by mouth 4 (four) times daily as needed for cough., Disp: 118 mL, Rfl: 0   REPATHA SURECLICK 140 MG/ML SOAJ, INJECT 140 MG INTO THE SKIN EVERY 14 DAYS, Disp: 6 mL, Rfl: 0   Respiratory Therapy Supplies (FLUTTER) DEVI, 1 Device by Does not apply route as needed., Disp: 1 each, Rfl: 0   sodium chloride HYPERTONIC 3 % nebulizer solution, Take by nebulization 2 (two) times daily as needed for cough. 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, Disp: 750 mL, Rfl: 12   tiZANidine (ZANAFLEX) 4 MG tablet, Take 0.5-1 tablets (2-4 mg total) by mouth 2 (two) times daily., Disp: 180 tablet, Rfl: 1   traZODone (DESYREL) 100 MG tablet, TAKE 1 TO 1 AND 1/2 TABLETS AT BEDTIME, Disp: 135 tablet, Rfl: 3  Current Facility-Administered Medications:    lidocaine (XYLOCAINE) 1 % (with pres) injection 1 mL, 1 mL, Other, Once, [name], Megan, MD   lidocaine (XYLOCAINE) 1 % (with pres) injection 3 mL, 3 mL, Other, Once, [name], Megan, MD  Past Medical History: 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   Bronchiectasis (HCC)    Cerebral aneurysm, nonruptured    had a clip put in   CHF (congestive heart failure) (HCC)    Clostridium difficile infection    Depressive disorder, not elsewhere classified    Diverticulosis of colon (without mention of hemorrhage)    Esophageal reflux    Fatty liver    Fibromyalgia    Gastritis    GERD (gastroesophageal reflux disease)    GI bleed 2004   Glaucoma    Hiatal hernia    History of COVID-19 05/04/2021   Hyperlipidemia    Hypertension    Hypothyroidism    Internal hemorrhoids    Joint pain    Multifocal pneumonia 06/03/2021   Obstructive sleep apnea (adult) (pediatric)    Osteoarthritis    Ostium secundum  type atrial septal defect    Other chronic nonalcoholic liver disease    Other pulmonary embolism and infarction    Palpitations    Paroxysmal ventricular tachycardia (HCC)    Personal history of radiation therapy    PONV (postoperative nausea and vomiting)    Presence of permanent cardiac pacemaker    PUD (peptic ulcer disease)    Radiation 02/03/15-03/10/15   Right Breast   Sarcoid    per pt , not sure   Schatzki's ring    Shortness of breath    Sleep apnea    Stroke [facility]) 2013   tia/ pt feels it was around  2008 0r 2009   Takotsubo syndrome    Tubular adenoma of colon    Unspecified transient cerebral ischemia    Unspecified vitamin D deficiency     Tobacco Use: Social History   Tobacco Use  Smoking Status Former   Current packs/day: 0.00   Average packs/day: 1 pack/day for 20.0 years (20.0 ttl pk-yrs)   Types: Cigarettes   Start date: 08/20/1969   Quit date: 08/20/1989   Years since quitting: 34.0   Passive exposure: Past  Smokeless Tobacco Never    Labs: Review Flowsheet  More data exists      Latest Ref Rng & Units 12/19/2021 06/27/2022 09/07/2022 10/29/2022 11/16/2022  Labs for ITP Cardiac and Pulmonary Rehab  Cholestrol 100 - 199 mg/dL - - 425  956  387   LDL (calc) 0 - 99 mg/dL - - 564  332  951   HDL-C >39 mg/dL - - 84  63  81   Trlycerides 0 - 149 mg/dL - - 77  884  92   Hemoglobin A1c 4.8 - 5.6 % 5.7  5.3  - - -    Capillary Blood Glucose: No results found for: "GLUCAP"   Pulmonary Assessment Scores:  Pulmonary Assessment Scores     Row Name 06/03/23 1352         ADL UCSD   ADL Phase Entry     SOB Score total 82       CAT Score   CAT Score 28       mMRC Score   mMRC Score 4             UCSD: Self-administered rating of dyspnea associated with activities of daily living (ADLs) 6-point scale (0 = "not at all" to 5 = "maximal or unable to do because of breathlessness")  Scoring Scores range from 0 to 120.  Minimally important difference is  5 units  CAT: CAT can identify the health impairment of COPD patients and is better correlated with disease progression.  CAT has a scoring range of zero to 40. The CAT score is classified into four groups of low (less than 10), medium (10 - 20), high (21-30) and very high (31-40) based on the impact level of disease on health status. A CAT score over 10 suggests significant symptoms.  A worsening CAT score could be explained by an exacerbation, poor medication adherence, poor inhaler technique, or progression of COPD or comorbid conditions.  CAT MCID is 2 points  mMRC: mMRC (Modified Medical Research Council) Dyspnea Scale is used to assess the degree of baseline functional disability in patients of respiratory disease due to dyspnea. No minimal important difference is established. A decrease in score of 1 point or greater is considered a positive change.   Pulmonary Function Assessment:  Pulmonary Function Assessment - 06/03/23 1337       Breath   Bilateral Breath Sounds Rales;Basilar    Shortness of Breath Yes;Limiting activity;Panic with Shortness of Breath             Exercise Target Goals: Exercise Program Goal: Individual exercise prescription set using results from initial 6 min walk test and THRR while considering  patient's activity barriers and safety.   Exercise Prescription Goal: Initial exercise prescription builds to 30-45 minutes a day of aerobic activity, 2-3 days per week.  Home exercise guidelines will be given to patient during program as part of exercise prescription that the participant will acknowledge.  Activity Barriers & Risk Stratification:  Activity  Barriers & Cardiac Risk Stratification - 06/03/23 1332       Activity Barriers & Cardiac Risk Stratification   Activity Barriers Back Problems;Left Knee Replacement;Right Knee Replacement;Deconditioning;Muscular Weakness;Shortness of Breath;Fibromyalgia    Cardiac Risk Stratification Moderate              6 Minute Walk:  6 Minute Walk     Row Name 06/03/23 1428         6 Minute Walk   Phase Initial     Distance 790 feet     Walk Time 6 minutes     # of Rest Breaks 0     MPH 1.5     METS 1.4     RPE 13     Perceived Dyspnea  3     VO2 Peak 4.94     Symptoms Yes (comment)     Comments Pt SOB but SpO2 never lower than 89 RA     Resting HR 80 bpm     Resting BP 120/68     Resting Oxygen Saturation  97 %     Exercise Oxygen Saturation  during 6 min walk 89 %     Max Ex. HR 107 bpm     Max Ex. BP 138/70     2 Minute Post BP 130/70       Interval HR   1 Minute HR 92     2 Minute HR 100     3 Minute HR 104     4 Minute HR 107     5 Minute HR 107     6 Minute HR 107     2 Minute Post HR 90     Interval Heart Rate? Yes       Interval Oxygen   Interval Oxygen? Yes     Baseline Oxygen Saturation % 97 %     1 Minute Oxygen Saturation % 95 %     1 Minute Liters of Oxygen 0 L     2 Minute Oxygen Saturation % 94 %     2 Minute Liters of Oxygen 0 L     3 Minute Oxygen Saturation % 91 %     3 Minute Liters of Oxygen 0 L     4 Minute Oxygen Saturation % 89 %     4 Minute Liters of Oxygen 0 L     5 Minute Oxygen Saturation % 89 %     5 Minute Liters of Oxygen 0 L     6 Minute Oxygen Saturation % 90 %     6 Minute Liters of Oxygen 0 L     2 Minute Post Oxygen Saturation % 91 %     2 Minute Post Liters of Oxygen 0 L              Oxygen Initial Assessment:  Oxygen Initial Assessment - 06/03/23 1334       Home Oxygen   Home Oxygen Device Home Concentrator;E-Tanks    Sleep Oxygen Prescription CPAP    Home Exercise Oxygen Prescription Continuous    Liters per minute 2    Home Resting Oxygen Prescription None    Compliance with Home Oxygen Use Yes      Initial 6 min Walk   Oxygen Used None    Liters per minute --      Program Oxygen Prescription   Program Oxygen Prescription None    Liters per minute --  Intervention   Short Term Goals To learn and  exhibit compliance with exercise, home and travel O2 prescription;To learn and understand importance of monitoring SPO2 with pulse oximeter and demonstrate accurate use of the pulse oximeter.;To learn and understand importance of maintaining oxygen saturations>88%;To learn and demonstrate proper pursed lip breathing techniques or other breathing techniques. ;To learn and demonstrate proper use of respiratory medications    Long  Term Goals Exhibits compliance with exercise, home  and travel O2 prescription;Maintenance of O2 saturations>88%;Compliance with respiratory medication;Verbalizes importance of monitoring SPO2 with pulse oximeter and return demonstration;Exhibits proper breathing techniques, such as pursed lip breathing or other method taught during program session;Demonstrates proper use of MDI's             Oxygen Re-Evaluation:  Oxygen Re-Evaluation     Row Name 06/14/23 1138 07/09/23 0853 08/01/23 0848         Program Oxygen Prescription   Program Oxygen Prescription Continuous Continuous Continuous     Liters per minute 3 2 2      Comments 3L walking, none on recumbent stepper 2L on second station the recumbent elliptical, none on recumbent stepper 2L on second station the recumbent elliptical, none on recumbent stepper       Home Oxygen   Home Oxygen Device Home Concentrator;E-Tanks Home Concentrator;E-Tanks Home Concentrator;E-Tanks     Sleep Oxygen Prescription CPAP CPAP CPAP     Home Exercise Oxygen Prescription Continuous Continuous Continuous     Liters per minute 2 2 2      Home Resting Oxygen Prescription None None None     Compliance with Home Oxygen Use Yes Yes Yes       Goals/Expected Outcomes   Short Term Goals To learn and exhibit compliance with exercise, home and travel O2 prescription;To learn and understand importance of monitoring SPO2 with pulse oximeter and demonstrate accurate use of the pulse oximeter.;To learn and understand importance of maintaining  oxygen saturations>88%;To learn and demonstrate proper pursed lip breathing techniques or other breathing techniques. ;To learn and demonstrate proper use of respiratory medications To learn and exhibit compliance with exercise, home and travel O2 prescription;To learn and understand importance of monitoring SPO2 with pulse oximeter and demonstrate accurate use of the pulse oximeter.;To learn and understand importance of maintaining oxygen saturations>88%;To learn and demonstrate proper pursed lip breathing techniques or other breathing techniques. ;To learn and demonstrate proper use of respiratory medications To learn and exhibit compliance with exercise, home and travel O2 prescription;To learn and understand importance of monitoring SPO2 with pulse oximeter and demonstrate accurate use of the pulse oximeter.;To learn and understand importance of maintaining oxygen saturations>88%;To learn and demonstrate proper pursed lip breathing techniques or other breathing techniques. ;To learn and demonstrate proper use of respiratory medications     Long  Term Goals Exhibits compliance with exercise, home  and travel O2 prescription;Maintenance of O2 saturations>88%;Compliance with respiratory medication;Verbalizes importance of monitoring SPO2 with pulse oximeter and return demonstration;Exhibits proper breathing techniques, such as pursed lip breathing or other method taught during program session;Demonstrates proper use of MDI's Exhibits compliance with exercise, home  and travel O2 prescription;Maintenance of O2 saturations>88%;Compliance with respiratory medication;Verbalizes importance of monitoring SPO2 with pulse oximeter and return demonstration;Exhibits proper breathing techniques, such as pursed lip breathing or other method taught during program session;Demonstrates proper use of MDI's Exhibits compliance with exercise, home  and travel O2 prescription;Maintenance of O2 saturations>88%;Compliance with  respiratory medication;Verbalizes importance of monitoring SPO2 with pulse oximeter and return demonstration;Exhibits proper breathing  techniques, such as pursed lip breathing or other method taught during program session;Demonstrates proper use of MDI's     Goals/Expected Outcomes Compliance and understanding of oxygen saturation monitoring and breathing techniques to decrease shortness of breath. Compliance and understanding of oxygen saturation monitoring and breathing techniques to decrease shortness of breath. Compliance and understanding of oxygen saturation monitoring and breathing techniques to decrease shortness of breath.              Oxygen Discharge (Final Oxygen Re-Evaluation):  Oxygen Re-Evaluation - 08/01/23 0848       Program Oxygen Prescription   Program Oxygen Prescription Continuous    Liters per minute 2    Comments 2L on second station the recumbent elliptical, none on recumbent stepper      Home Oxygen   Home Oxygen Device Home Concentrator;E-Tanks    Sleep Oxygen Prescription CPAP    Home Exercise Oxygen Prescription Continuous    Liters per minute 2    Home Resting Oxygen Prescription None    Compliance with Home Oxygen Use Yes      Goals/Expected Outcomes   Short Term Goals To learn and exhibit compliance with exercise, home and travel O2 prescription;To learn and understand importance of monitoring SPO2 with pulse oximeter and demonstrate accurate use of the pulse oximeter.;To learn and understand importance of maintaining oxygen saturations>88%;To learn and demonstrate proper pursed lip breathing techniques or other breathing techniques. ;To learn and demonstrate proper use of respiratory medications    Long  Term Goals Exhibits compliance with exercise, home  and travel O2 prescription;Maintenance of O2 saturations>88%;Compliance with respiratory medication;Verbalizes importance of monitoring SPO2 with pulse oximeter and return demonstration;Exhibits proper  breathing techniques, such as pursed lip breathing or other method taught during program session;Demonstrates proper use of MDI's    Goals/Expected Outcomes Compliance and understanding of oxygen saturation monitoring and breathing techniques to decrease shortness of breath.             Initial Exercise Prescription:  Initial Exercise Prescription - 06/03/23 1400       Date of Initial Exercise RX and Referring Provider   Date 06/03/23    Referring Provider [name]    Expected Discharge Date 08/27/23      NuStep   Level 1    SPM 70    Minutes 15    METs 1.5      Track   Minutes 15    METs 1.5      Prescription Details   Frequency (times per week) 2    Duration Progress to 30 minutes of continuous aerobic without signs/symptoms of physical distress      Intensity   THRR 40-80% of Max Heartrate 58-117    Ratings of Perceived Exertion 11-13    Perceived Dyspnea 0-4      Progression   Progression Continue to progress workloads to maintain intensity without signs/symptoms of physical distress.      Resistance Training   Training Prescription Yes    Weight red bands    Reps 10-15             Perform Capillary Blood Glucose checks as needed.  Exercise Prescription Changes:   Exercise Prescription Changes     Row Name 06/18/23 1500 07/02/23 1500 07/11/23 0931 07/25/23 1500 07/30/23 1400     Response to Exercise   Blood Pressure (Admit) 132/68 126/68 114/60 -- 124/68   Blood Pressure (Exercise) 140/80 138/72 -- -- 134/66   Blood Pressure (Exit) 128/76 112/66 122/64 --  112/66   Heart Rate (Admit) 75 bpm 97 bpm 97 bpm -- 91 bpm   Heart Rate (Exercise) 83 bpm 102 bpm 101 bpm -- 90 bpm   Heart Rate (Exit) 88 bpm 98 bpm 89 bpm -- 96 bpm   Oxygen Saturation (Admit) 95 % 92 % 95 % -- 91 %   Oxygen Saturation (Exercise) 94 % 96 % 93 % -- 90 %   Oxygen Saturation (Exit) 94 % 93 % 86 % -- 96 %   Rating of Perceived Exertion (Exercise) 13 13 13  -- 13   Perceived  Dyspnea (Exercise) 2 2 2  -- 2   Duration Continue with 30 min of aerobic exercise without signs/symptoms of physical distress. Continue with 30 min of aerobic exercise without signs/symptoms of physical distress. Continue with 30 min of aerobic exercise without signs/symptoms of physical distress. -- Continue with 30 min of aerobic exercise without signs/symptoms of physical distress.   Intensity THRR unchanged THRR unchanged THRR unchanged -- THRR unchanged     Progression   Progression Continue to progress workloads to maintain intensity without signs/symptoms of physical distress. Continue to progress workloads to maintain intensity without signs/symptoms of physical distress. Continue to progress workloads to maintain intensity without signs/symptoms of physical distress. -- Continue to progress workloads to maintain intensity without signs/symptoms of physical distress.     Resistance Training   Training Prescription Yes Yes Yes -- Yes   Weight red bands red bands blue -- blue   Reps 10-15 10-15 10-15 -- 10-15   Time 10 Minutes 10 Minutes 10 Minutes -- 10 Minutes     Interval Training   Interval Training No -- No -- --     Oxygen   Oxygen Continuous Continuous Continuous -- Continuous   Liters 2 2 2  -- 0-2     NuStep   Level 1 3 3  -- 4   SPM -- -- -- -- 90   Minutes 15 15 15  -- 15   METs 1.7 2.1 2.4 -- 2.41     Recumbant Elliptical   Level -- 2 2 -- 3   RPM -- -- -- -- 48   Watts -- -- -- -- 57   Minutes -- 15 15 -- 15   METs -- 2.6 3 -- 2.1     Track   Laps 8 -- -- -- --   Minutes 15 -- -- -- --   METs 2.23 -- -- -- --     Home Exercise Plan   Plans to continue exercise at -- -- -- [employer] (comment)  [facility] --   Frequency -- -- -- --  n/a --   Initial Home Exercises Provided -- -- -- 07/25/23 --     Oxygen   Maintain Oxygen Saturation 88% or higher 88% or higher 88% or higher -- 88% or higher            Exercise Comments:   Exercise Comments      Row Name 06/11/23 1542 07/25/23 1536         Exercise Comments Pt completed her first group exercise session. She exercised on the recumbent stepper for 15 min, level 1, METs 1.6. She then walked on the track for 15 min, METs 2.23. Her SpO2 decreased to 84 RA therefore applied 2L. She performed warm up and cool down standing, including squats. Discussed METs with good reception. Discussed with pt home exercise plan. She started exercising this week at the [facility] and is doing 3  nonrehab days a week, 30 min on the the recumbent elliptical. Congratulated pt and encouraged her to continue. She will need 1L while exercising. Encouraged her to increase workload as tolerated.               Exercise Goals and Review:   Exercise Goals     Row Name 06/03/23 1334             Exercise Goals   Increase Physical Activity Yes       Intervention Provide advice, education, support and counseling about physical activity/exercise needs.;Develop an individualized exercise prescription for aerobic and resistive training based on initial evaluation findings, risk stratification, comorbidities and participant's personal goals.       Expected Outcomes Short Term: Attend rehab on a regular basis to increase amount of physical activity.;Long Term: Add in home exercise to make exercise part of routine and to increase amount of physical activity.;Long Term: Exercising regularly at least 3-5 days a week.       Increase Strength and Stamina Yes       Intervention Provide advice, education, support and counseling about physical activity/exercise needs.;Develop an individualized exercise prescription for aerobic and resistive training based on initial evaluation findings, risk stratification, comorbidities and participant's personal goals.       Expected Outcomes Short Term: Increase workloads from initial exercise prescription for resistance, speed, and METs.;Short Term: Perform resistance training exercises routinely  during rehab and add in resistance training at home;Long Term: Improve cardiorespiratory fitness, muscular endurance and strength as measured by increased METs and functional capacity ( )       Able to understand and use rate of perceived exertion (RPE) scale Yes       Intervention Provide education and explanation on how to use RPE scale       Expected Outcomes Short Term: Able to use RPE daily in rehab to express subjective intensity level;Long Term:  Able to use RPE to guide intensity level when exercising independently       Able to understand and use Dyspnea scale Yes       Intervention Provide education and explanation on how to use Dyspnea scale       Expected Outcomes Short Term: Able to use Dyspnea scale daily in rehab to express subjective sense of shortness of breath during exertion;Long Term: Able to use Dyspnea scale to guide intensity level when exercising independently       Knowledge and understanding of Target Heart Rate Range (THRR) Yes       Intervention Provide education and explanation of THRR including how the numbers were predicted and where they are located for reference       Expected Outcomes Short Term: Able to state/look up THRR;Long Term: Able to use THRR to govern intensity when exercising independently;Short Term: Able to use daily as guideline for intensity in rehab       Understanding of Exercise Prescription Yes       Intervention Provide education, explanation, and written materials on patient's individual exercise prescription       Expected Outcomes Long Term: Able to explain home exercise prescription to exercise independently;Short Term: Able to explain program exercise prescription                Exercise Goals Re-Evaluation :  Exercise Goals Re-Evaluation     Row Name 06/14/23 1134 07/09/23 0850 08/01/23 0845         Exercise Goal Re-Evaluation   Exercise Goals Review Increase Physical  Activity;Able to understand and use Dyspnea  scale;Understanding of Exercise Prescription;Increase Strength and Stamina;Knowledge and understanding of Target Heart Rate Range (THRR);Able to understand and use rate of perceived exertion (RPE) scale Increase Physical Activity;Able to understand and use Dyspnea scale;Understanding of Exercise Prescription;Increase Strength and Stamina;Knowledge and understanding of Target Heart Rate Range (THRR);Able to understand and use rate of perceived exertion (RPE) scale Increase Physical Activity;Able to understand and use Dyspnea scale;Understanding of Exercise Prescription;Increase Strength and Stamina;Knowledge and understanding of Target Heart Rate Range (THRR);Able to understand and use rate of perceived exertion (RPE) scale     Comments Pt has completed 2 group exercise sessions. She is exercising on the recumbent stepper for 15 min, level 1, METs 1.8. She then is walking on the track for 15 min, METs 2.23. She is needing 2-3L O2 walking.  She performed warm up and cool down standing, including squats. Will progress as tolerated. Pt has completed 8 group exercise sessions. She is exercising on the recumbent stepper for 15 min, level 3, METs 2.3. She transitioned from the track to the recumbent elliptical due to hip pain. She does level 2, METs 2.8. She is increasing her METs every session and tolerating well. Will continue to progress as tolerated. Pt has completed 13 group exercise sessions. She is exercising on the recumbent stepper for 15 min, level 4, METs 2.4. She is then exercising on the recumbent elliptical for 15 min, level 3, METs 2.1. She recently increased levels on each machine but her METs did not increase. Will encourage speed. She performs warm up and cooldown standing, working on squats recently (has orthopedic limitations). Will continue to progress as tolerated.     Expected Outcomes Through exercise at rehab and home, the patient will decrease shortness of breath with daily activities and feel  confident in carrying out an exercise regimen at home Through exercise at rehab and home, the patient will decrease shortness of breath with daily activities and feel confident in carrying out an exercise regimen at home Through exercise at rehab and home, the patient will decrease shortness of breath with daily activities and feel confident in carrying out an exercise regimen at home              Discharge Exercise Prescription (Final Exercise Prescription Changes):  Exercise Prescription Changes - 07/30/23 1400       Response to Exercise   Blood Pressure (Admit) 124/68    Blood Pressure (Exercise) 134/66    Blood Pressure (Exit) 112/66    Heart Rate (Admit) 91 bpm    Heart Rate (Exercise) 90 bpm    Heart Rate (Exit) 96 bpm    Oxygen Saturation (Admit) 91 %    Oxygen Saturation (Exercise) 90 %    Oxygen Saturation (Exit) 96 %    Rating of Perceived Exertion (Exercise) 13    Perceived Dyspnea (Exercise) 2    Duration Continue with 30 min of aerobic exercise without signs/symptoms of physical distress.    Intensity THRR unchanged      Progression   Progression Continue to progress workloads to maintain intensity without signs/symptoms of physical distress.      Resistance Training   Training Prescription Yes    Weight blue    Reps 10-15    Time 10 Minutes      Oxygen   Oxygen Continuous    Liters 0-2      NuStep   Level 4    SPM 90    Minutes 15  METs 2.41      Recumbant Elliptical   Level 3    RPM 47    Watts 57    Minutes 15    METs 2.1      Oxygen   Maintain Oxygen Saturation 88% or higher             Nutrition:  Target Goals: Understanding of nutrition guidelines, daily intake of sodium 1500mg , cholesterol 200mg , calories 30% from fat and 7% or less from saturated fats, daily to have 5 or more servings of fruits and vegetables.  Biometrics:  Pre Biometrics - 06/03/23 1443       Pre Biometrics   Grip Strength 20 kg               Nutrition Therapy Plan and Nutrition Goals:  Nutrition Therapy & Goals - 07/09/23 1357       Nutrition Therapy   Diet Heart Healthy Diet      Personal Nutrition Goals   Nutrition Goal Patient to improve diet quality by using the plate method as a guide for meal planning to include lean protein/plant protein, fruits, vegetables, whole grains, nonfat dairy as part of a well-balanced diet.   goal in progress.   Comments Goal in progress. 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 She continues repatha for elevated lipid panel. We have discussed multiple strategies for weight loss including the plate method, calorie density, benefits of high fiber/high protein intake, etc. She continues to work on decreasing sweets/refined carbohydrates. She does plan to inquire about GLP-1 for weight loss with PCP. She is down 1.1# since starting with our program. Patient will benefit from participation in pulmonary rehab for nutrition, exercise, and lifestyle modification.      Intervention Plan   Intervention Prescribe, educate and counsel regarding individualized specific dietary modifications aiming towards targeted core components such as weight, hypertension, lipid management, diabetes, heart failure and other comorbidities.;Nutrition handout(s) given to patient.    Expected Outcomes Short Term Goal: Understand basic principles of dietary content, such as calories, fat, sodium, cholesterol and nutrients.;Long Term Goal: Adherence to prescribed nutrition plan.             Nutrition Assessments:  Nutrition Assessments - 06/11/23 1558       Rate Your Plate Scores   Pre Score 61            MEDIFICTS Score Key: >=70 Need to make dietary changes  40-70 Heart Healthy Diet <= 40 Therapeutic  Level Cholesterol Diet  Flowsheet Row PULMONARY REHAB OTHER RESPIRATORY from 06/11/2023 in [facility] for Heart, Vascular, & Lung Health  Picture Your Plate Total Score on Admission 61      Picture Your Plate Scores: <16 Unhealthy dietary pattern with much room for improvement. 41-50 Dietary pattern unlikely to meet recommendations for good health and room for improvement. 51-60 More healthful dietary pattern, with some room for improvement.  >60 Healthy dietary pattern, although there may be some specific behaviors that could be improved.    Nutrition Goals Re-Evaluation:  Nutrition Goals Re-Evaluation     Row Name 06/11/23 1358 07/09/23 1357           Goals   Current Weight 221 lb 12.5 oz (100.6 kg) 219 lb 9.3 oz (99.6 kg)  Comment cholesterol 229, LDL 132, A1c WNL no new labs; most recent labs  cholesterol 229, LDL 132, A1c WNL      Expected Outcome Avlynn has medical history of IPF, pulmonary embolism (on eliquis), OSA, HTN.She has recently stopped taking pirfenidone over the last two weeks due to side effects of dizziness; she reports that this will be re-evaluated at follow-up in November. She continues repatha for elevated lipid panel. She reports motivation to improve eating habits by increasing fiber intake from vegetables and decreasing sweets. Patient will benefit from participation in pulmonary rehab for nutrition, exercise, and lifestyle modification. Goal in progress. Shonica has medical history of IPF, pulmonary embolism (on eliquis), OSA, HTN.She has recently stopped taking pirfenidone over the last two weeks due to side effects of dizziness; she reports that this will be re-evaluated at follow-up in November. She continues repatha for elevated lipid panel. We have discussed multiple strategies for weight loss including the plate method, calorie density, benefits of high fiber/high protein intake, etc. She continues to work on decreasing  sweets/refined carbohydrates. She does plan to inquire about GLP-1 for weight loss with PCP. She is down 1.1# since starting with our program. Patient will benefit from participation in pulmonary rehab for nutrition, exercise, and lifestyle modification.               Nutrition Goals Discharge (Final Nutrition Goals Re-Evaluation):  Nutrition Goals Re-Evaluation - 07/09/23 1357       Goals   Current Weight 219 lb 9.3 oz (99.6 kg)    Comment no new labs; most recent labs  cholesterol 229, LDL 132, A1c WNL    Expected Outcome Goal in progress. Karleigh has medical history of IPF, pulmonary embolism (on eliquis), OSA, HTN.She has recently stopped taking pirfenidone over the last two weeks due to side effects of dizziness; she reports that this will be re-evaluated at follow-up in November. She continues repatha for elevated lipid panel. We have discussed multiple strategies for weight loss including the plate method, calorie density, benefits of high fiber/high protein intake, etc. She continues to work on decreasing sweets/refined carbohydrates. She does plan to inquire about GLP-1 for weight loss with PCP. She is down 1.1# since starting with our program. Patient will benefit from participation in pulmonary rehab for nutrition, exercise, and lifestyle modification.             Psychosocial: Target Goals: Acknowledge presence or absence of significant depression and/or stress, maximize coping skills, provide positive support system. Participant is able to verbalize types and ability to use techniques and skills needed for reducing stress and depression.  Initial Review & Psychosocial Screening:  Initial Psych Review & Screening - 06/03/23 1327       Initial Review   Current issues with Current Depression;History of Depression      Family Dynamics   Good Support System? Yes      Barriers   Psychosocial barriers to participate in program Psychosocial barriers identified (see note)       Screening Interventions   Interventions Encouraged to exercise;To provide support and resources with identified psychosocial needs    Expected Outcomes Short Term goal: Utilizing psychosocial counselor, staff and physician to assist with identification of specific Stressors or current issues interfering with healing process. Setting desired goal for each stressor or current issue identified.;Long Term Goal: Stressors or current issues are controlled or eliminated.;Short Term goal: Identification and review with participant of any Quality of Life or Depression concerns found by scoring the  questionnaire.;Long Term goal: The participant improves quality of Life and PHQ9 Scores as seen by post scores and/or verbalization of changes             Quality of Life Scores:  Scores of 19 and below usually indicate a poorer quality of life in these areas.  A difference of  2-3 points is a clinically meaningful difference.  A difference of 2-3 points in the total score of the Quality of Life Index has been associated with significant improvement in overall quality of life, self-image, physical symptoms, and general health in studies assessing change in quality of life.  PHQ-9: Review Flowsheet  More data exists      08/12/2023 07/16/2023 06/18/2023 06/03/2023 05/07/2023  Depression screen PHQ 2/9  Decreased Interest 1 1 1 2 1   Down, Depressed, Hopeless 1 1 1 2 1   PHQ - 2 Score 2 2 2 4 2   Altered sleeping - - 1 3 1   Tired, decreased energy - 1 1 2 1   Change in appetite - 1 1 2  0  Feeling bad or failure about yourself  - 1 1 2 1   Trouble concentrating - 1 1 1 1   Moving slowly or fidgety/restless - 1 1 1 1   Suicidal thoughts - 0 0 0 0  PHQ-9 Score - - 8 15 7   Difficult doing work/chores - Somewhat difficult Somewhat difficult Somewhat difficult Somewhat difficult   Interpretation of Total Score  Total Score Depression Severity:  1-4 = Minimal depression, 5-9 = Mild depression, 10-14 = Moderate  depression, 15-19 = Moderately severe depression, 20-27 = Severe depression   Psychosocial Evaluation and Intervention:  Psychosocial Evaluation - 06/03/23 1329       Psychosocial Evaluation & Interventions   Interventions Encouraged to exercise with the program and follow exercise prescription    Comments Adelene's pre PHQ9 was 15. She currently feels depressed and is frustrated becuase she does not know the cause of her IPF. She has an appointment with her primary doctor to address her current depression.    Expected Outcomes For Davinia to participate in PR free of any psychosocial barriers or concerns.    Continue Psychosocial Services  Follow up required by staff             Psychosocial Re-Evaluation:  Psychosocial Re-Evaluation     Row Name 06/14/23 1224 07/12/23 1318 08/07/23 1232         Psychosocial Re-Evaluation   Current issues with Current Depression;History of Depression Current Depression;History of Depression Current Depression;History of Depression     Comments No changes since orientation. Cashe has attended 2 sessions so far. We will continue to monitor and assess throughout the program. Psychosocial monthly re-evaluation is as follows: Denora is still concerned about her IPF. She is currently taking Pirfenidone and is tolerating it well. She informed us last month that she had an appointment with her MD and is now currently seeing a therapist. Monalisa denied any needs at this time. Psychosocial monthly re-evaluation is as follows: Yvon is still concerned about her IPF. She started working with a therapist about her health concerns. She is looking for a partner. Adraya is also contemplating getting a dog for companionship. Addilee denied any psy/soc needs at this time.     Expected Outcomes For Alandria to participate in PR free of any psychosocial barriers or concerns. For Ozetta to participate in PR free of any psychosocial barriers or concerns. For Edin to participate in PR free  of any  psychosocial barriers or concerns. For Emara to have decreased signs and symptoms of depression. To use positive coping mechanisms to decrease stress and depression.     Interventions Encouraged to attend Pulmonary Rehabilitation for the exercise Encouraged to attend Pulmonary Rehabilitation for the exercise Encouraged to attend Pulmonary Rehabilitation for the exercise     Continue Psychosocial Services  Follow up required by counselor No Follow up required No Follow up required              Psychosocial Discharge (Final Psychosocial Re-Evaluation):  Psychosocial Re-Evaluation - 08/07/23 1232       Psychosocial Re-Evaluation   Current issues with Current Depression;History of Depression    Comments Psychosocial monthly re-evaluation is as follows: Itzelle is still concerned about her IPF. She started working with a therapist about her health concerns. She is looking for a partner. Keyani is also contemplating getting a dog for companionship. Lelsie denied any psy/soc needs at this time.    Expected Outcomes For Dayani to participate in PR free of any psychosocial barriers or concerns. For Emmalise to have decreased signs and symptoms of depression. To use positive coping mechanisms to decrease stress and depression.    Interventions Encouraged to attend Pulmonary Rehabilitation for the exercise    Continue Psychosocial Services  No Follow up required             Education: Education Goals: Education classes will be provided on a weekly basis, covering required topics. Participant will state understanding/return demonstration of topics presented.  Learning Barriers/Preferences:  Learning Barriers/Preferences - 06/03/23 1329       Learning Barriers/Preferences   Learning Barriers Sight   needs cataract surgery on the left eye   Learning Preferences Written Material             Education Topics: Know Your Numbers Group instruction that is supported by a PowerPoint  presentation. Instructor discusses importance of knowing and understanding resting, exercise, and post-exercise oxygen saturation, heart rate, and blood pressure. Oxygen saturation, heart rate, blood pressure, rating of perceived exertion, and dyspnea are reviewed along with a normal range for these values.    Exercise for the Pulmonary Patient Group instruction that is supported by a PowerPoint presentation. Instructor discusses benefits of exercise, core components of exercise, frequency, duration, and intensity of an exercise routine, importance of utilizing pulse oximetry during exercise, safety while exercising, and options of places to exercise outside of rehab.    MET Level  Group instruction provided by PowerPoint, verbal discussion, and written material to support subject matter. Instructor reviews what METs are and how to increase METs.  Flowsheet Row PULMONARY REHAB OTHER RESPIRATORY from 07/11/2023 in [facility] for Heart, Vascular, & Lung Health  Date 07/11/23  Educator EP  Instruction Review Code 1- Verbalizes Understanding       Pulmonary Medications Verbally interactive group education provided by instructor with focus on inhaled medications and proper administration. Flowsheet Row PULMONARY REHAB OTHER RESPIRATORY from 08/08/2023 in [facility] for Heart, Vascular, & Lung Health  Date 08/08/23  Educator RT  Instruction Review Code 1- Verbalizes Understanding       Anatomy and Physiology of the Respiratory System Group instruction provided by PowerPoint, verbal discussion, and written material to support subject matter. Instructor reviews respiratory cycle and anatomical components of the respiratory system and their functions. Instructor also reviews differences in obstructive and restrictive respiratory diseases with examples of each.  Flowsheet Row PULMONARY REHAB OTHER RESPIRATORY  from 08/01/2023 in [facility] for Heart, Vascular, & Lung Health  Date 08/01/23  Educator RT  Instruction Review Code 1- Verbalizes Understanding       Oxygen Safety Group instruction provided by PowerPoint, verbal discussion, and written material to support subject matter. There is an overview of "What is Oxygen" and "Why do we need it".  Instructor also reviews how to create a safe environment for oxygen use, the importance of using oxygen as prescribed, and the risks of noncompliance. There is a brief discussion on traveling with oxygen and resources the patient may utilize.   Oxygen Use Group instruction provided by PowerPoint, verbal discussion, and written material to discuss how supplemental oxygen is prescribed and different types of oxygen supply systems. Resources for more information are provided.    Breathing Techniques Group instruction that is supported by demonstration and informational handouts. Instructor discusses the benefits of pursed lip and diaphragmatic breathing and detailed demonstration on how to perform both.  Flowsheet Row PULMONARY REHAB OTHER RESPIRATORY from 06/13/2023 in [facility] for Heart, Vascular, & Lung Health  Date 06/13/23  Educator RN  Instruction Review Code 1- Verbalizes Understanding        Risk Factor Reduction Group instruction that is supported by a PowerPoint presentation. Instructor discusses the definition of a risk factor, different risk factors for pulmonary disease, and how the heart and lungs work together. Flowsheet Row PULMONARY REHAB OTHER RESPIRATORY from 07/04/2023 in [facility] for Heart, Vascular, & Lung Health  Date 07/04/23  Educator EP  Instruction Review Code 1- Verbalizes Understanding       Pulmonary Diseases Group instruction provided by PowerPoint, verbal discussion, and written material to support subject matter. Instructor gives an overview of the different type of pulmonary  diseases. There is also a discussion on risk factors and symptoms as well as ways to manage the diseases. Flowsheet Row PULMONARY REHAB OTHER RESPIRATORY from 07/25/2023 in [facility] for Heart, Vascular, & Lung Health  Date 07/25/23  Educator RT  Instruction Review Code 1- Verbalizes Understanding       Stress and Energy Conservation Group instruction provided by PowerPoint, verbal discussion, and written material to support subject matter. Instructor gives an overview of stress and the impact it can have on the body. Instructor also reviews ways to reduce stress. There is also a discussion on energy conservation and ways to conserve energy throughout the day. Flowsheet Row PULMONARY REHAB OTHER RESPIRATORY from 06/20/2023 in [facility] for Heart, Vascular, & Lung Health  Date 06/20/23  Educator RN  Instruction Review Code 1- Verbalizes Understanding       Warning Signs and Symptoms Group instruction provided by PowerPoint, verbal discussion, and written material to support subject matter. Instructor reviews warning signs and symptoms of stroke, heart attack, cold and flu. Instructor also reviews ways to prevent the spread of infection.   Other Education Group or individual verbal, written, or video instructions that support the educational goals of the pulmonary rehab program.    Knowledge Questionnaire Score:  Knowledge Questionnaire Score - 06/03/23 1353       Knowledge Questionnaire Score   Pre Score 13/18             Core Components/Risk Factors/Patient Goals at Admission:  Personal Goals and Risk Factors at Admission - 06/03/23 1330       Core Components/Risk Factors/Patient Goals on Admission    Weight  Management Yes;Weight Loss    Intervention Weight Management: Develop a combined nutrition and exercise program designed to reach desired caloric intake, while maintaining appropriate intake of nutrient and fiber,  sodium and fats, and appropriate energy expenditure required for the weight goal.;Weight Management: Provide education and appropriate resources to help participant work on and attain dietary goals.;Weight Management/Obesity: Establish reasonable short term and long term weight goals.;Obesity: Provide education and appropriate resources to help participant work on and attain dietary goals.    Expected Outcomes Short Term: Continue to assess and modify interventions until short term weight is achieved;Long Term: Adherence to nutrition and physical activity/exercise program aimed toward attainment of established weight goal;Weight Loss: Understanding of general recommendations for a balanced deficit meal plan, which promotes 1-2 lb weight loss per week and includes a negative energy balance of [phone] kcal/d;Understanding recommendations for meals to include 15-35% energy as protein, 25-35% energy from fat, 35-60% energy from carbohydrates, less than 200mg  of dietary cholesterol, 20-35 gm of total fiber daily;Understanding of distribution of calorie intake throughout the day with the consumption of 4-5 meals/snacks    Improve shortness of breath with ADL's Yes    Intervention Provide education, individualized exercise plan and daily activity instruction to help decrease symptoms of SOB with activities of daily living.    Expected Outcomes Short Term: Improve cardiorespiratory fitness to achieve a reduction of symptoms when performing ADLs;Long Term: Be able to perform more ADLs without symptoms or delay the onset of symptoms             Core Components/Risk Factors/Patient Goals Review:   Goals and Risk Factor Review     Row Name 06/14/23 1227 07/12/23 1432 08/07/23 1235         Core Components/Risk Factors/Patient Goals Review   Personal Goals Review Weight Management/Obesity;Improve shortness of breath with ADL's;Develop more efficient breathing techniques such as purse lipped breathing and  diaphragmatic breathing and practicing self-pacing with activity. Weight Management/Obesity;Improve shortness of breath with ADL's Weight Management/Obesity;Improve shortness of breath with ADL's     Review Unable to assess goals yet. Loyda has attended 2 sessions so far. We will continue to monitor her progress throughout the program. Core components/risk factors/patient goals monthly re-evaluate are as follows: Goal in progress for weight loss. Akayla is working with our dietician and is trying to make small changes. She is down ~1.1# since starting. She is motivated to lose weight and plan to speak to her MD about a GLP-1. Goal progressing on improving her shortness of breath with ADLs. Josselin is slowly making progress on improving both her workload and METs. She has been in the program for about 5 weeks. Her oxygen saturation has remained WNL on 2L [place]. Armetta informed us that she recently had a sleep test and now needs to wear 2L at night with her Cpap. She is hoping this will increase her energy and get more restful sleep. Goal met on developing more efficient breathing techniques such as purse lipped breathing and diaphragmatic breathing and practicing self-pacing with activity. Franka can initiate PLB on her own without staff intervention and she has been practicing diaphragmatic breathing at home. She is able to listen to her body, rest, and slow her pace when she begins to become short or breath. We will continue to evaluate and assist Keilen with goals in [facility]. She will continue to benefit from PR for nutrition, education, exercise, and lifestyle modification. Core components/risk factors/patient goals monthly re-evaluate are as follows: Goal in progress  for weight loss. Adra is worked with our dietician and is trying to make small changes. She is down ~2.4# since starting. She is motivated to lose weight. Unfortunately, Ranie informed us she has been denied from her insurance company to get a GLP1.  Goal progressing on improving her shortness of breath with ADLs. Emmajo is slowly making progress on improving both her workload and METs. Her oxygen saturation has remained WNL, we have been able to wean her down to 1L [place]. Keyshla informed us that she recently had a sleep test and now needs to wear 2L at night with her Cpap. She is hoping this will increase her energy and get more restful sleep. We will continue to evaluate and assist Zeyna with goals in [facility]. She will continue to benefit from PR for nutrition, education, exercise, and lifestyle modification.     Expected Outcomes For Ketsia to lose weight, improve shortness of breath with ADLs, and develop more efficient breathing techniques such as purse lipped breathing and diaphragmatic breathing; and practicing self-pacing with activity For Ellanore to lose weight and improve shortness of breath with ADLs For Karryn to lose weight and improve her shortness of breath with ADLs              Core Components/Risk Factors/Patient Goals at Discharge (Final Review):   Goals and Risk Factor Review - 08/07/23 1235       Core Components/Risk Factors/Patient Goals Review   Personal Goals Review Weight Management/Obesity;Improve shortness of breath with ADL's    Review Core components/risk factors/patient goals monthly re-evaluate are as follows: Goal in progress for weight loss. Pollyann is worked with our dietician and is trying to make small changes. She is down ~2.4# since starting. She is motivated to lose weight. Unfortunately, Alexicia informed us she has been denied from her insurance company to get a GLP1. Goal progressing on improving her shortness of breath with ADLs. Jaylena is slowly making progress on improving both her workload and METs. Her oxygen saturation has remained WNL, we have been able to wean her down to 1L [place]. Johnanna informed us that she recently had a sleep test and now needs to wear 2L at night with her Cpap. She is hoping this will  increase her energy and get more restful sleep. We will continue to evaluate and assist Sieanna with goals in [facility]. She will continue to benefit from PR for nutrition, education, exercise, and lifestyle modification.    Expected Outcomes For Loreda to lose weight and improve her shortness of breath with ADLs             ITP Comments: Pt is making expected progress toward Pulmonary Rehab goals after completing 16 session(s). Recommend continued exercise, life style modification, education, and utilization of breathing techniques to increase stamina and strength, while also decreasing shortness of breath with exertion.     Comments: Dr. Mechele Collin is Medical Director for Pulmonary Rehab at [facility].

## 2023-08-15 ENCOUNTER — Encounter (HOSPITAL_COMMUNITY)
Admission: RE | Admit: 2023-08-15 | Discharge: 2023-08-15 | Disposition: A | Discharge status: Home / Self Care | Payer: Medicare Other | Source: Ambulatory Visit | Attending: Internal Medicine | Admitting: Internal Medicine

## 2023-08-15 DIAGNOSIS — J84112 Idiopathic pulmonary fibrosis: Secondary | ICD-10-CM

## 2023-08-15 LAB — TOXASSURE SELECT,+ANTIDEPR,UR

## 2023-08-15 NOTE — Progress Notes (Signed)
Daily Session Note  Patient Details  Name: [name] MRN: [id number] Date of Birth: October 30, 1950 Referring Provider:   Doristine Devoid Pulmonary Rehab Walk Test from 06/03/2023 in [facility]  Referring Provider Ramaswamy       Encounter Date: 08/15/2023  Check In:  Session Check In - 08/15/23 1326       Check-In   Supervising physician immediately available to respond to emergencies [facility] MD immediately available    Physician(s) Reather Littler, NP    Location [facility]    Staff Present Elissa Lovett BS, ACSM-CEP, Exercise Physiologist;Gurbani Figge Gerre Scull, RN, Fuller Plan, RT    Virtual Visit No    Medication changes reported     No    Fall or balance concerns reported    No    Tobacco Cessation No Change    Warm-up and Cool-down Performed as group-led instruction    Resistance Training Performed Yes    VAD Patient? No    PAD/SET Patient? No      Pain Assessment   Currently in Pain? No/denies    Multiple Pain Sites No             Capillary Blood Glucose: No results found. However, due to the size of the patient record, not all encounters were searched. Please check Results Review for a complete set of results.    Social History   Tobacco Use  Smoking Status Former   Current packs/day: 0.00   Average packs/day: 1 pack/day for 20.0 years (20.0 ttl pk-yrs)   Types: Cigarettes   Start date: 08/20/1969   Quit date: 08/20/1989   Years since quitting: 34.0   Passive exposure: Past  Smokeless Tobacco Never    Goals Met:  Independence with exercise equipment Exercise tolerated well No report of concerns or symptoms today Strength training completed today  Goals Unmet:  Not Applicable  Comments: Service time is from 1319 to 1440    Dr. Mechele Collin is Medical Director for Pulmonary Rehab at [facility].

## 2023-08-17 ENCOUNTER — Other Ambulatory Visit: Payer: Self-pay | Admitting: Physical Medicine and Rehabilitation

## 2023-08-20 ENCOUNTER — Other Ambulatory Visit: Payer: Self-pay

## 2023-08-20 ENCOUNTER — Encounter (HOSPITAL_COMMUNITY)
Admission: RE | Admit: 2023-08-20 | Discharge: 2023-08-20 | Disposition: A | Discharge status: Home / Self Care | Payer: Medicare Other | Source: Ambulatory Visit | Attending: Internal Medicine | Admitting: Internal Medicine

## 2023-08-20 DIAGNOSIS — J84112 Idiopathic pulmonary fibrosis: Secondary | ICD-10-CM

## 2023-08-20 NOTE — Progress Notes (Signed)
 Daily Session Note  Patient Details  Name: [name] MRN: [id number] Date of Birth: 1950-09-23 Referring Provider:   Conrad Ports Pulmonary Rehab Walk Test from 06/03/2023 in [facility]  Referring Provider Ramaswamy       Encounter Date: 08/20/2023  Check In:  Session Check In - 08/20/23 1319       Check-In   Supervising physician immediately available to respond to emergencies [facility] MD immediately available    Physician(s) Orren Fabry, PA    Location [facility]    Staff Present Cloyd Aris BS, ACSM-CEP, Exercise Physiologist;Mary Harvy, RN, Wetzel Sharps, RT    Virtual Visit No    Medication changes reported     No    Fall or balance concerns reported    No    Tobacco Cessation No Change    Warm-up and Cool-down Performed as group-led instruction    Resistance Training Performed Yes    VAD Patient? No    PAD/SET Patient? No      Pain Assessment   Currently in Pain? No/denies    Pain Score 0-No pain    Multiple Pain Sites No             Capillary Blood Glucose: No results found. However, due to the size of the patient record, not all encounters were searched. Please check Results Review for a complete set of results.    Social History   Tobacco Use  Smoking Status Former   Current packs/day: 0.00   Average packs/day: 1 pack/day for 20.0 years (20.0 ttl pk-yrs)   Types: Cigarettes   Start date: 08/20/1969   Quit date: 08/20/1989   Years since quitting: 34.0   Passive exposure: Past  Smokeless Tobacco Never    Goals Met:  Independence with exercise equipment Exercise tolerated well No report of concerns or symptoms today Strength training completed today  Goals Unmet:  Not Applicable  Comments: Pt completed 6 min walk test and graduated. Service time is from 1419 to 1434.    Dr. Slater Staff is Medical Director for Pulmonary Rehab at [facility].

## 2023-08-20 NOTE — Progress Notes (Signed)
 Specialty Pharmacy Refill Coordination Note  [name] is a 72 y.o. female contacted today regarding refills of specialty medication(s) Pirfenidone    Patient requested Delivery   Delivery date: 08/30/23   Verified address: [address]   Medication will be filled on 08/29/23.

## 2023-08-20 NOTE — Progress Notes (Signed)
 Discharge Progress Report  Patient Details  Name: [name] MRN: [id number] Date of Birth: 1951-05-07 Referring Provider:   [facility] Walk Test from 06/03/2023 in [facility]  Referring Provider Ramaswamy        Number of Visits: 47  Reason for Discharge:  Patient has met program and personal goals.  Smoking History:  Social History   Tobacco Use  Smoking Status Former   Current packs/day: 0.00   Average packs/day: 1 pack/day for 20.0 years (20.0 ttl pk-yrs)   Types: Cigarettes   Start date: 08/20/1969   Quit date: 08/20/1989   Years since quitting: 34.0   Passive exposure: Past  Smokeless Tobacco Never    Diagnosis:  IPF (idiopathic pulmonary fibrosis) (HCC)  ADL UCSD:  Pulmonary Assessment Scores     Row Name 06/03/23 1352 08/15/23 1337 08/20/23 1427     ADL UCSD   ADL Phase Entry Exit Exit   SOB Score total 82 57 57     CAT Score   CAT Score 28 18 18      mMRC Score   mMRC Score 4 -- 4            Initial Exercise Prescription:  Initial Exercise Prescription - 06/03/23 1400       Date of Initial Exercise RX and Referring Provider   Date 06/03/23    Referring Provider Ramaswamy    Expected Discharge Date 08/27/23      NuStep   Level 1    SPM 70    Minutes 15    METs 1.5      Track   Minutes 15    METs 1.5      Prescription Details   Frequency (times per week) 2    Duration Progress to 30 minutes of continuous aerobic without signs/symptoms of physical distress      Intensity   THRR 40-80% of Max Heartrate 58-117    Ratings of Perceived Exertion 11-13    Perceived Dyspnea 0-4      Progression   Progression Continue to progress workloads to maintain intensity without signs/symptoms of physical distress.      Resistance Training   Training Prescription Yes    Weight red bands    Reps 10-15             Discharge Exercise Prescription (Final  Exercise Prescription Changes):  Exercise Prescription Changes - 08/08/23 1228       Response to Exercise   Blood Pressure (Admit) 124/72    Blood Pressure (Exit) 118/66    Heart Rate (Admit) 93 bpm    Heart Rate (Exercise) 105 bpm    Heart Rate (Exit) 84 bpm    Oxygen Saturation (Admit) 93 %    Oxygen Saturation (Exercise) 89 %    Oxygen Saturation (Exit) 96 %    Rating of Perceived Exertion (Exercise) 13    Perceived Dyspnea (Exercise) 2    Duration Continue with 30 min of aerobic exercise without signs/symptoms of physical distress.    Intensity THRR unchanged      Progression   Progression Continue to progress workloads to maintain intensity without signs/symptoms of physical distress.      Resistance Training   Training Prescription Yes    Weight blue    Reps 10-15    Time 10 Minutes      Oxygen   Oxygen Continuous    Liters --   0-1  NuStep   Level 3    Minutes 15    METs 2.6      Recumbant Elliptical   Level 4    Minutes 15    METs 2.6      Oxygen   Maintain Oxygen Saturation 88% or higher             Functional Capacity:  6 Minute Walk     Row Name 06/03/23 1428 08/20/23 1423       6 Minute Walk   Phase Initial Discharge    Distance 790 feet 1296 feet    Distance % Change -- 64.05 %    Distance Feet Change -- 506 ft    Walk Time 6 minutes 6 minutes    # of Rest Breaks 0 0    MPH 1.5 2.45    METS 1.4 2.57    RPE 13 12    Perceived Dyspnea  3 2    VO2 Peak 4.94 9.01    Symptoms Yes (comment) No    Comments Pt SOB but SpO2 never lower than 89 RA Pt pushed herself and needed 2L. Main c/o was 5/10 left hip pain with distance    Resting HR 80 bpm 93 bpm    Resting BP 120/68 146/80    Resting Oxygen Saturation  97 % 95 %    Exercise Oxygen Saturation  during 6 min walk 89 % 87 %    Max Ex. HR 107 bpm 125 bpm    Max Ex. BP 138/70 142/58    2 Minute Post BP 130/70 120/60      Interval HR   1 Minute HR 92 101    2 Minute HR 100 112     3 Minute HR 104 114    4 Minute HR 107 122    5 Minute HR 107 123    6 Minute HR 107 125    2 Minute Post HR 90 100    Interval Heart Rate? Yes Yes      Interval Oxygen   Interval Oxygen? Yes Yes    Baseline Oxygen Saturation % 97 % 95 %    1 Minute Oxygen Saturation % 95 % 93 %    1 Minute Liters of Oxygen 0 L 1 L    2 Minute Oxygen Saturation % 94 % 90 %    2 Minute Liters of Oxygen 0 L 1 L    3 Minute Oxygen Saturation % 91 % 90 %    3 Minute Liters of Oxygen 0 L 1 L    4 Minute Oxygen Saturation % 89 % 87 %    4 Minute Liters of Oxygen 0 L 1 L  increased to 2L    5 Minute Oxygen Saturation % 89 % 89 %    5 Minute Liters of Oxygen 0 L 2 L    6 Minute Oxygen Saturation % 90 % 88 %    6 Minute Liters of Oxygen 0 L 2 L    2 Minute Post Oxygen Saturation % 91 % 97 %    2 Minute Post Liters of Oxygen 0 L 2 L             Psychological, QOL, Others - Outcomes: PHQ 2/9:    08/15/2023    1:38 PM 08/12/2023    1:43 PM 07/16/2023    3:27 PM 06/18/2023    2:53 PM 06/03/2023    1:08 PM  Depression screen PHQ 2/9  Decreased Interest 0 1 1 1 2   Down, Depressed, Hopeless 1 1 1 1 2   PHQ - 2 Score 1 2 2 2 4   Altered sleeping 1   1 3   Tired, decreased energy 1  1 1 2   Change in appetite 0  1 1 2   Feeling bad or failure about yourself  1  1 1 2   Trouble concentrating 1  1 1 1   Moving slowly or fidgety/restless 0  1 1 1   Suicidal thoughts 0  0 0 0  PHQ-9 Score 5   8 15   Difficult doing work/chores Somewhat difficult  Somewhat difficult Somewhat difficult Somewhat difficult    Quality of Life:   Personal Goals: Goals established at orientation with interventions provided to work toward goal.  Personal Goals and Risk Factors at Admission - 06/03/23 1330       Core Components/Risk Factors/Patient Goals on Admission    Weight Management Yes;Weight Loss    Intervention Weight Management: Develop a combined nutrition and exercise program designed to reach desired caloric  intake, while maintaining appropriate intake of nutrient and fiber, sodium and fats, and appropriate energy expenditure required for the weight goal.;Weight Management: Provide education and appropriate resources to help participant work on and attain dietary goals.;Weight Management/Obesity: Establish reasonable short term and long term weight goals.;Obesity: Provide education and appropriate resources to help participant work on and attain dietary goals.    Expected Outcomes Short Term: Continue to assess and modify interventions until short term weight is achieved;Long Term: Adherence to nutrition and physical activity/exercise program aimed toward attainment of established weight goal;Weight Loss: Understanding of general recommendations for a balanced deficit meal plan, which promotes 1-2 lb weight loss per week and includes a negative energy balance of [id number] kcal/d;Understanding recommendations for meals to include 15-35% energy as protein, 25-35% energy from fat, 35-60% energy from carbohydrates, less than 200mg  of dietary cholesterol, 20-35 gm of total fiber daily;Understanding of distribution of calorie intake throughout the day with the consumption of 4-5 meals/snacks    Improve shortness of breath with ADL's Yes    Intervention Provide education, individualized exercise plan and daily activity instruction to help decrease symptoms of SOB with activities of daily living.    Expected Outcomes Short Term: Improve cardiorespiratory fitness to achieve a reduction of symptoms when performing ADLs;Long Term: Be able to perform more ADLs without symptoms or delay the onset of symptoms              Personal Goals Discharge:  Goals and Risk Factor Review     Row Name 06/14/23 1227 07/12/23 1432 08/07/23 1235 08/20/23 1522       Core Components/Risk Factors/Patient Goals Review   Personal Goals Review Weight Management/Obesity;Improve shortness of breath with ADL's;Develop more efficient  breathing techniques such as purse lipped breathing and diaphragmatic breathing and practicing self-pacing with activity. Weight Management/Obesity;Improve shortness of breath with ADL's Weight Management/Obesity;Improve shortness of breath with ADL's Weight Management/Obesity;Improve shortness of breath with ADL's    Review Unable to assess goals yet. Evamaria has attended 2 sessions so far. We will continue to monitor her progress throughout the program. Core components/risk factors/patient goals monthly re-evaluate are as follows: Goal in progress for weight loss. Latiesha is working with our dietician and is trying to make small changes. She is down ~1.1# since starting. She is motivated to lose weight and plan to speak to her MD about a GLP-1. Goal progressing on improving her shortness of breath with ADLs. Dickey  is slowly making progress on improving both her workload and METs. She has been in the program for about 5 weeks. Her oxygen saturation has remained WNL on 2L [place]. Imara informed us  that she recently had a sleep test and now needs to wear 2L at night with her Cpap. She is hoping this will increase her energy and get more restful sleep. Goal met on developing more efficient breathing techniques such as purse lipped breathing and diaphragmatic breathing and practicing self-pacing with activity. Aneyah can initiate PLB on her own without staff intervention and she has been practicing diaphragmatic breathing at home. She is able to listen to her body, rest, and slow her pace when she begins to become short or breath. We will continue to evaluate and assist Bernardine with goals in [facility]. She will continue to benefit from PR for nutrition, education, exercise, and lifestyle modification. Core components/risk factors/patient goals monthly re-evaluate are as follows: Goal in progress for weight loss. Chianne is worked with our dietician and is trying to make small changes. She is down ~2.4# since starting. She is  motivated to lose weight. Unfortunately, Imogean informed us  she has been denied from her insurance company to get a GLP1. Goal progressing on improving her shortness of breath with ADLs. Alissia is slowly making progress on improving both her workload and METs. Her oxygen saturation has remained WNL, we have been able to wean her down to 1L [place]. Alyha informed us  that she recently had a sleep test and now needs to wear 2L at night with her Cpap. She is hoping this will increase her energy and get more restful sleep. We will continue to evaluate and assist Latoy with goals in [facility]. She will continue to benefit from PR for nutrition, education, exercise, and lifestyle modification. Makaya graduated today, 08/20/23 from the pulmonary rehab program. Goal met for weight loss. Derrisha lost 2.6#'s since starting the program. Goal met for improving SOB with ADL's. Dorethea's SOB score decreased from 82 to 57. Heidy worked hard during the [employer] and made great progress.    Expected Outcomes For Emilyrose to lose weight, improve shortness of breath with ADLs, and develop more efficient breathing techniques such as purse lipped breathing and diaphragmatic breathing; and practicing self-pacing with activity For Gianelle to lose weight and improve shortness of breath with ADLs For Marycatherine to lose weight and improve her shortness of breath with ADLs Pt will continue to apply the knowledge she learned in PR             Exercise Goals and Review:  Exercise Goals     Row Name 06/03/23 1334             Exercise Goals   Increase Physical Activity Yes       Intervention Provide advice, education, support and counseling about physical activity/exercise needs.;Develop an individualized exercise prescription for aerobic and resistive training based on initial evaluation findings, risk stratification, comorbidities and participant's personal goals.       Expected Outcomes Short Term: Attend rehab on a regular basis to increase  amount of physical activity.;Long Term: Add in home exercise to make exercise part of routine and to increase amount of physical activity.;Long Term: Exercising regularly at least 3-5 days a week.       Increase Strength and Stamina Yes       Intervention Provide advice, education, support and counseling about physical activity/exercise needs.;Develop an individualized exercise prescription for aerobic and resistive training based on  initial evaluation findings, risk stratification, comorbidities and participant's personal goals.       Expected Outcomes Short Term: Increase workloads from initial exercise prescription for resistance, speed, and METs.;Short Term: Perform resistance training exercises routinely during rehab and add in resistance training at home;Long Term: Improve cardiorespiratory fitness, muscular endurance and strength as measured by increased METs and functional capacity ( )       Able to understand and use rate of perceived exertion (RPE) scale Yes       Intervention Provide education and explanation on how to use RPE scale       Expected Outcomes Short Term: Able to use RPE daily in rehab to express subjective intensity level;Long Term:  Able to use RPE to guide intensity level when exercising independently       Able to understand and use Dyspnea scale Yes       Intervention Provide education and explanation on how to use Dyspnea scale       Expected Outcomes Short Term: Able to use Dyspnea scale daily in rehab to express subjective sense of shortness of breath during exertion;Long Term: Able to use Dyspnea scale to guide intensity level when exercising independently       Knowledge and understanding of Target Heart Rate Range (THRR) Yes       Intervention Provide education and explanation of THRR including how the numbers were predicted and where they are located for reference       Expected Outcomes Short Term: Able to state/look up THRR;Long Term: Able to use THRR to govern  intensity when exercising independently;Short Term: Able to use daily as guideline for intensity in rehab       Understanding of Exercise Prescription Yes       Intervention Provide education, explanation, and written materials on patient's individual exercise prescription       Expected Outcomes Long Term: Able to explain home exercise prescription to exercise independently;Short Term: Able to explain program exercise prescription                Exercise Goals Re-Evaluation:  Exercise Goals Re-Evaluation     Row Name 06/14/23 1134 07/09/23 0850 08/01/23 0845 08/20/23 1459       Exercise Goal Re-Evaluation   Exercise Goals Review Increase Physical Activity;Able to understand and use Dyspnea scale;Understanding of Exercise Prescription;Increase Strength and Stamina;Knowledge and understanding of Target Heart Rate Range (THRR);Able to understand and use rate of perceived exertion (RPE) scale Increase Physical Activity;Able to understand and use Dyspnea scale;Understanding of Exercise Prescription;Increase Strength and Stamina;Knowledge and understanding of Target Heart Rate Range (THRR);Able to understand and use rate of perceived exertion (RPE) scale Increase Physical Activity;Able to understand and use Dyspnea scale;Understanding of Exercise Prescription;Increase Strength and Stamina;Knowledge and understanding of Target Heart Rate Range (THRR);Able to understand and use rate of perceived exertion (RPE) scale Increase Physical Activity;Able to understand and use Dyspnea scale;Understanding of Exercise Prescription;Increase Strength and Stamina;Knowledge and understanding of Target Heart Rate Range (THRR);Able to understand and use rate of perceived exertion (RPE) scale    Comments Pt has completed 2 group exercise sessions. She is exercising on the recumbent stepper for 15 min, level 1, METs 1.8. She then is walking on the track for 15 min, METs 2.23. She is needing 2-3L [place]2 walking.  She performed  warm up and cool down standing, including squats. Will progress as tolerated. Pt has completed 8 group exercise sessions. She is exercising on the recumbent stepper for 15 min, level 3,  METs 2.3. She transitioned from the track to the recumbent elliptical due to hip pain. She does level 2, METs 2.8. She is increasing her METs every session and tolerating well. Will continue to progress as tolerated. Pt has completed 13 group exercise sessions. She is exercising on the recumbent stepper for 15 min, level 4, METs 2.4. She is then exercising on the recumbent elliptical for 15 min, level 3, METs 2.1. She recently increased levels on each machine but her METs did not increase. Will encourage speed. She performs warm up and cooldown standing, working on squats recently (has orthopedic limitations). Will continue to progress as tolerated. Pt completed 19 exercise sessions. At her graduation she was exercising on the recumbent stepper for 15 min, level 4, METs 2.6. She then exercised on the recumbent elliptical for 15 min, level 4, METs 2.5. She had to quit walking the track due to hip pain. She increased her 6 min walk test by 64%, 506 ft increase. Her SOB score decreased from 82 to 57 and her CAT score went from 28 to 18. Her grip strength increased from 20 to 25 kg. Pt has been very successful in program and plans to continue exercising at the gym.    Expected Outcomes Through exercise at rehab and home, the patient will decrease shortness of breath with daily activities and feel confident in carrying out an exercise regimen at home Through exercise at rehab and home, the patient will decrease shortness of breath with daily activities and feel confident in carrying out an exercise regimen at home Through exercise at rehab and home, the patient will decrease shortness of breath with daily activities and feel confident in carrying out an exercise regimen at home Through exercise at rehab and home, the patient will  decrease shortness of breath with daily activities and feel confident in carrying out an exercise regimen at home             Nutrition & Weight - Outcomes:  Pre Biometrics - 06/03/23 1443       Pre Biometrics   Grip Strength 20 kg             Post Biometrics - 08/20/23 1500        Post  Biometrics   Grip Strength 25 kg             Nutrition:  Nutrition Therapy & Goals - 07/09/23 1357       Nutrition Therapy   Diet Heart Healthy Diet      Personal Nutrition Goals   Nutrition Goal Patient to improve diet quality by using the plate method as a guide for meal planning to include lean protein/plant protein, fruits, vegetables, whole grains, nonfat dairy as part of a well-balanced diet.   goal in progress.   Comments Goal in progress. Saleen has medical history of IPF, pulmonary embolism (on eliquis ), OSA, HTN.She has recently stopped taking pirfenidone  over the last two weeks due to side effects of dizziness; she reports that this will be re-evaluated at follow-up in November. She continues repatha  for elevated lipid panel. We have discussed multiple strategies for weight loss including the plate method, calorie density, benefits of high fiber/high protein intake, etc. She continues to work on decreasing sweets/refined carbohydrates. She does plan to inquire about GLP-1 for weight loss with PCP. She is down 1.1# since starting with our program. Patient will benefit from participation in pulmonary rehab for nutrition, exercise, and lifestyle modification.      Intervention  Plan   Intervention Prescribe, educate and counsel regarding individualized specific dietary modifications aiming towards targeted core components such as weight, hypertension, lipid management, diabetes, heart failure and other comorbidities.;Nutrition handout(s) given to patient.    Expected Outcomes Short Term Goal: Understand basic principles of dietary content, such as calories, fat, sodium, cholesterol  and nutrients.;Long Term Goal: Adherence to prescribed nutrition plan.             Nutrition Discharge:  Nutrition Assessments - 08/15/23 1342       Rate Your Plate Scores   Post Score 48             Education Questionnaire Score:  Knowledge Questionnaire Score - 08/15/23 1337       Knowledge Questionnaire Score   Pre Score 13/18    Post Score 15/18             Goals reviewed with patient; copy given to patient.

## 2023-08-21 HISTORY — PX: CARDIAC DEFIBRILLATOR PLACEMENT: SHX171

## 2023-08-22 ENCOUNTER — Encounter (HOSPITAL_COMMUNITY): Payer: Medicare Other

## 2023-08-27 ENCOUNTER — Encounter (HOSPITAL_COMMUNITY): Payer: Medicare Other

## 2023-08-28 ENCOUNTER — Ambulatory Visit (INDEPENDENT_AMBULATORY_CARE_PROVIDER_SITE_OTHER): Payer: Medicare Other

## 2023-08-28 DIAGNOSIS — I428 Other cardiomyopathies: Secondary | ICD-10-CM | POA: Diagnosis not present

## 2023-08-28 LAB — CUP PACEART REMOTE DEVICE CHECK
Battery Remaining Longevity: 9 mo
Battery Remaining Percentage: 10 %
Brady Statistic RV Percent Paced: 0 %
Date Time Interrogation Session: 20250108030100
HighPow Impedance: 74 Ohm
Implantable Lead Connection Status: 753985
Implantable Lead Implant Date: 20050603
Implantable Lead Location: 753860
Implantable Lead Model: 185
Implantable Lead Serial Number: 116340
Implantable Pulse Generator Implant Date: 20110505
Lead Channel Impedance Value: 631 Ohm
Lead Channel Pacing Threshold Amplitude: 0.8 V
Lead Channel Pacing Threshold Pulse Width: 0.4 ms
Lead Channel Setting Pacing Amplitude: 2.4 V
Lead Channel Setting Pacing Pulse Width: 0.4 ms
Lead Channel Setting Sensing Sensitivity: 0.4 mV
Pulse Gen Serial Number: 266301
Zone Setting Status: 755011

## 2023-08-29 ENCOUNTER — Encounter (HOSPITAL_COMMUNITY): Payer: Medicare Other

## 2023-08-29 ENCOUNTER — Telehealth: Payer: Self-pay | Admitting: Pharmacy Technician

## 2023-08-29 ENCOUNTER — Other Ambulatory Visit: Payer: Self-pay

## 2023-08-29 ENCOUNTER — Other Ambulatory Visit (HOSPITAL_COMMUNITY): Payer: Self-pay

## 2023-08-29 NOTE — Telephone Encounter (Signed)
 Patient Advocate Encounter   The patient was approved for a [employer] grant that will help cover the cost of Jardiance  Total amount awarded, $10,000.  Effective: 08/09/23 - 08/07/24   APW:[id number] ERW:[id number] Hmnle:[id number] [id number]   Pharmacy provided with approval and processing information. Patient informed via telephone  Corean Maiden, CPhT-Adv  Pharmacy Patient Advocate Specialist  Direct Number: [phone] Fax: [phone]

## 2023-09-01 ENCOUNTER — Encounter (HOSPITAL_BASED_OUTPATIENT_CLINIC_OR_DEPARTMENT_OTHER): Payer: Self-pay

## 2023-09-01 ENCOUNTER — Other Ambulatory Visit: Payer: Self-pay | Admitting: Physical Medicine and Rehabilitation

## 2023-09-03 ENCOUNTER — Encounter: Payer: Self-pay | Admitting: Licensed Clinical Social Worker

## 2023-09-03 ENCOUNTER — Telehealth: Payer: Self-pay

## 2023-09-03 NOTE — Telephone Encounter (Signed)
   Pre-operative Risk Assessment    Patient Name: [name]  DOB: [birth year] MRN: [id number]   Date of last office visit: 04/15/23 Dr.Varanasi  Date of next office visit: 10/11/23 Dr. Lonni   Request for Surgical Clearance    Procedure:   Excision of fat necrosis   Date of Surgery:  Clearance TBD                                 Surgeon:  Deward Null III, MD Surgeon's Group or Practice Name:  [facility]  Phone number:  [phone] Fax number:  [phone]   Type of Clearance Requested:   - Medical  - Pharmacy:  Hold Apixaban  (Eliquis ) Not indicated    Type of Anesthesia:  General    Additional requests/questions:    [name]   09/03/2023, 1:25 PM

## 2023-09-03 NOTE — Telephone Encounter (Signed)
   Name: [name]  DOB: [birth year]  MRN: [id number]  Primary Cardiologist: Candyce Reek, MD Last seen on 04/15/2023.   Preoperative team, please contact this patient and set up a phone call appointment for further preoperative risk assessment. Please obtain consent and complete medication review. Thank you for your help.  I confirm that guidance regarding antiplatelet and oral anticoagulation therapy has been completed and, if necessary, noted below.  Apixaban  is managed by Dr. Geronimo, pulmonologist.  He will need to give recommendations on holding.  I also confirmed the patient resides in the state of [place] . As per [facility] Medical Board telemedicine laws, the patient must reside in the state in which the provider is licensed.   Lamarr Satterfield, NP 09/03/2023, 4:58 PM [facility]

## 2023-09-04 ENCOUNTER — Other Ambulatory Visit: Payer: Self-pay

## 2023-09-04 ENCOUNTER — Other Ambulatory Visit (HOSPITAL_COMMUNITY): Payer: Self-pay

## 2023-09-04 ENCOUNTER — Telehealth: Payer: Self-pay

## 2023-09-04 NOTE — Telephone Encounter (Signed)
  Patient Consent for Virtual Visit        [name] has provided verbal consent on 09/04/2023 for a virtual visit (video or telephone).   CONSENT FOR VIRTUAL VISIT FOR:  [name]  By participating in this virtual visit I agree to the following:  I hereby voluntarily request, consent and authorize [facility] and its employed or contracted physicians, physician assistants, nurse practitioners or other licensed health care professionals (the Practitioner), to provide me with telemedicine health care services (the "Services") as deemed necessary by the treating Practitioner. I acknowledge and consent to receive the Services by the Practitioner via telemedicine. I understand that the telemedicine visit will involve communicating with the Practitioner through live audiovisual communication technology and the disclosure of certain medical information by electronic transmission. I acknowledge that I have been given the opportunity to request an in-person assessment or other available alternative prior to the telemedicine visit and am voluntarily participating in the telemedicine visit.  I understand that I have the right to withhold or withdraw my consent to the use of telemedicine in the course of my care at any time, without affecting my right to future care or treatment, and that the Practitioner or I may terminate the telemedicine visit at any time. I understand that I have the right to inspect all information obtained and/or recorded in the course of the telemedicine visit and may receive copies of available information for a reasonable fee.  I understand that some of the potential risks of receiving the Services via telemedicine include:  Delay or interruption in medical evaluation due to technological equipment failure or disruption; Information transmitted may not be sufficient (e.g. poor resolution of images) to allow for appropriate medical decision making by the  Practitioner; and/or  In rare instances, security protocols could fail, causing a breach of personal health information.  Furthermore, I acknowledge that it is my responsibility to provide information about my medical history, conditions and care that is complete and accurate to the best of my ability. I acknowledge that Practitioner's advice, recommendations, and/or decision may be based on factors not within their control, such as incomplete or inaccurate data provided by me or distortions of diagnostic images or specimens that may result from electronic transmissions. I understand that the practice of medicine is not an exact science and that Practitioner makes no warranties or guarantees regarding treatment outcomes. I acknowledge that a copy of this consent can be made available to me via my patient portal [facility] MyChart), or I can request a printed copy by calling the office of [facility].    I understand that my insurance will be billed for this visit.   I have read or had this consent read to me. I understand the contents of this consent, which adequately explains the benefits and risks of the Services being provided via telemedicine.  I have been provided ample opportunity to ask questions regarding this consent and the Services and have had my questions answered to my satisfaction. I give my informed consent for the services to be provided through the use of telemedicine in my medical care

## 2023-09-04 NOTE — Telephone Encounter (Signed)
 Preop televisit now scheduled, med rec and consent done.

## 2023-09-05 ENCOUNTER — Telehealth: Payer: Self-pay | Admitting: Pulmonary Disease

## 2023-09-05 NOTE — Telephone Encounter (Signed)
Fax received from Dr. Chevis Pretty III  with [facility] to perform a excision of fat necrosis under general anesthesia on patient.  Patient needs surgery clearance. Surgery is pending. Patient was seen on 07/15/23. Office protocol is a risk assessment can be sent to surgeon if patient has been seen in 60 days or less.   Sending to Dr Wynona Neat for risk assessment or recommendations if patient needs to be seen in office prior to surgical procedure.

## 2023-09-05 NOTE — Telephone Encounter (Signed)
Surgical clearance  Patient is moderate risk for respiratory failure, ventilator requirement perioperatively -probably about 1 to 5% risk for postoperative respiratory failure  She does have underlying history of interstitial lung disease, this has been stable.  History of obstructive sleep apnea for which she is compliant with CPAP use.  She does not have any acutely reversible problems as of the last visit.  If benefit of surgery outweighs the risks outlined above, she is cleared to go ahead with surgery with attendant risks.  If any further information is needed, kindly contact the office.

## 2023-09-07 ENCOUNTER — Emergency Department (HOSPITAL_COMMUNITY): Payer: Medicare Other

## 2023-09-07 ENCOUNTER — Encounter (HOSPITAL_COMMUNITY): Payer: Self-pay

## 2023-09-07 ENCOUNTER — Other Ambulatory Visit: Payer: Self-pay

## 2023-09-07 ENCOUNTER — Emergency Department (HOSPITAL_COMMUNITY)
Admission: EM | Admit: 2023-09-07 | Discharge: 2023-09-07 | Disposition: A | Discharge status: Home / Self Care | Payer: Medicare Other | Attending: Emergency Medicine | Admitting: Emergency Medicine

## 2023-09-07 DIAGNOSIS — I509 Heart failure, unspecified: Secondary | ICD-10-CM | POA: Diagnosis not present

## 2023-09-07 DIAGNOSIS — Z7901 Long term (current) use of anticoagulants: Secondary | ICD-10-CM | POA: Diagnosis not present

## 2023-09-07 DIAGNOSIS — I110 Hypertensive heart disease with heart failure: Secondary | ICD-10-CM | POA: Insufficient documentation

## 2023-09-07 DIAGNOSIS — R1031 Right lower quadrant pain: Secondary | ICD-10-CM | POA: Diagnosis present

## 2023-09-07 DIAGNOSIS — R748 Abnormal levels of other serum enzymes: Secondary | ICD-10-CM | POA: Diagnosis not present

## 2023-09-07 DIAGNOSIS — K529 Noninfective gastroenteritis and colitis, unspecified: Secondary | ICD-10-CM | POA: Insufficient documentation

## 2023-09-07 LAB — COMPREHENSIVE METABOLIC PANEL
ALT: 77 U/L — ABNORMAL HIGH (ref 0–44)
AST: 113 U/L — ABNORMAL HIGH (ref 15–41)
Albumin: 3.9 g/dL (ref 3.5–5.0)
Alkaline Phosphatase: 155 U/L — ABNORMAL HIGH (ref 38–126)
Anion gap: 7 (ref 5–15)
BUN: 14 mg/dL (ref 8–23)
CO2: 24 mmol/L (ref 22–32)
Calcium: 9.6 mg/dL (ref 8.9–10.3)
Chloride: 108 mmol/L (ref 98–111)
Creatinine, Ser: 0.81 mg/dL (ref 0.44–1.00)
GFR, Estimated: >60 mL/min (ref >60)
Glucose, Bld: 113 mg/dL — ABNORMAL HIGH (ref 70–99)
Potassium: 4 mmol/L (ref 3.5–5.1)
Sodium: 139 mmol/L (ref 135–145)
Total Bilirubin: 0.6 mg/dL (ref 0.0–1.2)
Total Protein: 7 g/dL (ref 6.5–8.1)

## 2023-09-07 LAB — CBC WITH DIFFERENTIAL/PLATELET
Abs Immature Granulocytes: 0.02 10*3/uL (ref 0.00–0.07)
Basophils Absolute: 0 10*3/uL (ref 0.0–0.1)
Basophils Relative: 1 %
Eosinophils Absolute: 0.2 10*3/uL (ref 0.0–0.5)
Eosinophils Relative: 3 %
HCT: 47.4 % — ABNORMAL HIGH (ref 36.0–46.0)
Hemoglobin: 14.5 g/dL (ref 12.0–15.0)
Immature Granulocytes: 0 %
Lymphocytes Relative: 17 %
Lymphs Abs: 1.1 10*3/uL (ref 0.7–4.0)
MCH: 29.1 pg (ref 26.0–34.0)
MCHC: 30.6 g/dL (ref 30.0–36.0)
MCV: 95 fL (ref 80.0–100.0)
Monocytes Absolute: 0.5 10*3/uL (ref 0.1–1.0)
Monocytes Relative: 9 %
Neutro Abs: 4.3 10*3/uL (ref 1.7–7.7)
Neutrophils Relative %: 70 %
Platelets: 209 10*3/uL (ref 150–400)
RBC: 4.99 MIL/uL (ref 3.87–5.11)
RDW: 13.5 % (ref 11.5–15.5)
WBC: 6.1 10*3/uL (ref 4.0–10.5)
nRBC: 0 % (ref 0.0–0.2)

## 2023-09-07 LAB — URINALYSIS, ROUTINE W REFLEX MICROSCOPIC
Bacteria, UA: NONE SEEN
Bilirubin Urine: NEGATIVE
Glucose, UA: >=500 mg/dL — AB
Hgb urine dipstick: NEGATIVE
Ketones, ur: NEGATIVE mg/dL
Leukocytes,Ua: NEGATIVE
Nitrite: NEGATIVE
Protein, ur: NEGATIVE mg/dL
Specific Gravity, Urine: >1.046 — ABNORMAL HIGH (ref 1.005–1.030)
pH: 6 (ref 5.0–8.0)

## 2023-09-07 LAB — WET PREP, GENITAL
Clue Cells Wet Prep HPF POC: NONE SEEN
Sperm: NONE SEEN
Trich, Wet Prep: NONE SEEN
WBC, Wet Prep HPF POC: >=10 — AB (ref <10)
Yeast Wet Prep HPF POC: NONE SEEN

## 2023-09-07 LAB — LIPASE, BLOOD: Lipase: 26 U/L (ref 11–51)

## 2023-09-07 MED ORDER — IOHEXOL 300 MG/ML  SOLN
100.0000 mL | Freq: Once | INTRAMUSCULAR | Status: AC | PRN
  Administered 2023-09-07: 100 mL via INTRAVENOUS

## 2023-09-07 NOTE — ED Provider Notes (Signed)
[place] EMERGENCY DEPARTMENT AT [facility] Provider Note   CSN: [id number] Arrival date & time: 09/07/23  1245     History  Chief Complaint  Patient presents with   Abdominal Pain    [name] is a 73 y.o. female with past medical history of hypertension, sarcoid, CHF, history of pulmonary embolism on Eliquis, GERD, C. difficile, cerebral aneurysm, GI bleed, peptic ulcer disease presenting to the emergency room with complaint of right abdominal pain that radiates to the back.  Reports some decreased appetite.  Took oxycodone at home prior to arrival which helped "ease the pain."  Reports having diarrhea last night.  Has not noted any black stool or blood in stool.  Denies any fever chills chest pain, shortness of breath or weakness.  Reports having hysterectomy surgery.  She is unsure if she still has her appendix.  Reports she has not had any urinary frequency or urgency, denies dysuria.   Abdominal Pain      Home Medications Prior to Admission medications   Medication Sig Start Date End Date Taking? Authorizing Provider  albuterol (VENTOLIN HFA) 108 (90 Base) MCG/ACT inhaler Inhale 2 puffs into the lungs every 6 (six) hours as needed for wheezing or shortness of breath. 06/05/23   Parrett, Virgel Bouquet, NP  apixaban (ELIQUIS) 5 MG TABS tablet Take 1 tablet (5 mg total) by mouth 2 (two) times daily. 05/24/23   Kalman Shan, MD  azelastine (ASTELIN) 0.1 % nasal spray Place 1 spray into both nostrils 2 (two) times daily. Use in each nostril as directed 10/24/21   Coralyn Helling, MD  benzonatate (TESSALON) 200 MG capsule Take 1 capsule (200 mg total) by mouth 3 (three) times daily as needed for cough. 04/02/23   Kalman Shan, MD  budesonide-formoterol (SYMBICORT) 80-4.5 MCG/ACT inhaler Inhale 1 puff into the lungs 2 (two) times daily. 06/21/23   Parrett, Virgel Bouquet, NP  Cholecalciferol (DIALYVITE VITAMIN D 5000) 125 MCG (5000 UT) capsule Take 5,000 Units by mouth daily.     [provider]  cloBAZam (ONFI) 10 MG tablet Take 1 tablet (10 mg total) by mouth 2 (two) times daily. 05/27/23   Ihor Austin, NP  diltiazem (CARDIZEM CD) 120 MG 24 hr capsule Take 1 capsule (120 mg total) by mouth daily. 11/22/22   Duke Salvia, MD  empagliflozin (JARDIANCE) 10 MG TABS tablet Take 1 tablet (10 mg total) by mouth daily. 11/20/22   Duke Salvia, MD  famotidine (PEPCID) 40 MG tablet TAKE 1 TABLET AT BEDTIME (NEED MD APPOINTMENT FOR REFILLS) 05/29/23   Pyrtle, Carie Caddy, MD  fexofenadine (ALLEGRA) 180 MG tablet Take 180 mg by mouth daily as needed for allergies (alternates with zyrtec sometimes).    [provider]  fluticasone (FLONASE) 50 MCG/ACT nasal spray Place 2 sprays into both nostrils daily as needed for allergies or rhinitis. 09/15/21   [name], NP  FOLIC ACID PO Take 1 tablet by mouth daily.    [provider]  furosemide (LASIX) 20 MG tablet TAKE 2 TABLETS (40 MG) 3 DAYS PER WEEK AND TAKE 1 TABLET (20 MG) THE OTHER DAYS OF THE WEEK 06/04/23   Corky Crafts, MD  hydroquinone 4 % cream Apply 1 Application topically at bedtime. 03/29/23   [provider]  hydroxypropyl methylcellulose / hypromellose (ISOPTO TEARS / GONIOVISC) 2.5 % ophthalmic solution Place 1 drop into both eyes at bedtime.    [provider]  latanoprost (XALATAN) 0.005 % ophthalmic  solution Place 1 drop into both eyes at bedtime.    [provider]  levocetirizine (XYZAL) 5 MG tablet Take 5 mg by mouth daily at 12 noon.    [provider]  levothyroxine (SYNTHROID) 50 MCG tablet TAKE 1 TABLET EVERY DAY 03/08/23   Georganna Skeans, MD  magnesium oxide (MAG-OX) 400 (240 Mg) MG tablet TAKE 1 TABLET TWICE DAILY 02/11/23   Sheilah Pigeon, PA-C  montelukast (SINGULAIR) 10 MG tablet TAKE 1 TABLET AT BEDTIME 05/01/23   Olalere, Adewale A, MD  Multiple Vitamins-Minerals (MULTIVITAMIN PO) Take 1 tablet by mouth daily.    [provider]  oxyCODONE (OXY IR/ROXICODONE) 5 MG immediate release tablet Take 0.5-1 tablets (2.5-5 mg total) by mouth 2 (two) times daily as needed for severe pain (pain score 7-10). 09/03/23   Lovorn, Aundra Millet, MD  pantoprazole (PROTONIX) 40 MG tablet TAKE 1 TABLET EVERY DAY 11/22/22   Pyrtle, Carie Caddy, MD  Pirfenidone 267 MG TABS Take 3 tablets (801 mg total) by mouth 3 (three) times daily with meals. 04/26/23   Kalman Shan, MD  polyethylene glycol powder (GLYCOLAX/MIRALAX) powder Take 17 grams by mouth daily 02/27/16   Pyrtle, Carie Caddy, MD  potassium chloride (KLOR-CON M) 10 MEQ tablet TAKE 1/2 TABLET EVERY DAY 07/01/23   Jodelle Red, MD  promethazine-dextromethorphan (PROMETHAZINE-DM) 6.25-15 MG/5ML syrup Take 5 mLs by mouth 4 (four) times daily as needed for cough. 02/19/23   Glenford Bayley, NP  REPATHA SURECLICK 140 MG/ML SOAJ INJECT 140 MG INTO THE SKIN EVERY 14 DAYS 08/08/23   Duke Salvia, MD  Respiratory Therapy Supplies (FLUTTER) DEVI 1 Device by Does not apply route as needed. 09/22/18   Ivonne Andrew, NP  sodium chloride HYPERTONIC 3 % nebulizer solution Take by nebulization 2 (two) times daily as needed for cough. 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 03/04/23   Lovorn, Aundra Millet, MD  traZODone (DESYREL) 100 MG tablet TAKE 1 TO 1 AND 1/2 TABLETS AT BEDTIME 12/12/22   Lovorn, Aundra Millet, MD      Allergies    Ace inhibitors, Amitriptyline hcl, Atorvastatin, Dilantin [phenytoin sodium extended], Duloxetine hcl, Paroxetine, Ramipril, Rosuvastatin, Carbamazepine, Codeine, Phenytoin sodium extended, Simvastatin, and Wellbutrin [bupropion]    Review of Systems   Review of Systems  Gastrointestinal:  Positive for abdominal pain.    Physical Exam Updated Vital Signs BP 130/64 (BP Location: Right Arm)   Pulse (!) 106   Temp 97.9 F (36.6 C) (Oral)   Resp 16   Ht 5' 5.5" (1.664 m)   Wt  99.1 kg   LMP  (LMP Unknown)   SpO2 95%   BMI 35.80 kg/m  Physical Exam Vitals and nursing note reviewed.  Constitutional:      General: She is not in acute distress.    Appearance: She is not toxic-appearing.  HENT:     Head: Normocephalic and atraumatic.     Nose: No congestion or rhinorrhea.  Eyes:     General: No scleral icterus.    Conjunctiva/sclera: Conjunctivae normal.  Cardiovascular:     Rate and Rhythm: Normal rate and regular rhythm.     Pulses: Normal pulses.     Heart sounds: Normal heart sounds.  Pulmonary:     Effort: Pulmonary effort is normal. No respiratory distress.     Breath sounds: Normal breath sounds.  Abdominal:  General: Abdomen is flat. Bowel sounds are normal. There is no distension.     Palpations: Abdomen is soft. There is no mass.     Tenderness: There is abdominal tenderness. There is no right CVA tenderness, left CVA tenderness or rebound.     Comments: RUQ and RLQ abdominal pain.   Genitourinary:    Vagina: No vaginal discharge.  Musculoskeletal:     Right lower leg: No edema.     Left lower leg: No edema.  Skin:    General: Skin is warm and dry.     Findings: No lesion.  Neurological:     General: No focal deficit present.     Mental Status: She is alert and oriented to person, place, and time. Mental status is at baseline.     ED Results / Procedures / Treatments   Labs (all labs ordered are listed, but only abnormal results are displayed) Labs Reviewed  CBC WITH DIFFERENTIAL/PLATELET - Abnormal; Notable for the following components:      Result Value   HCT 47.4 (*)    All other components within normal limits  COMPREHENSIVE METABOLIC PANEL - Abnormal; Notable for the following components:   Glucose, Bld 113 (*)    AST 113 (*)    ALT 77 (*)    Alkaline Phosphatase 155 (*)    All other components within normal limits  LIPASE, BLOOD  URINALYSIS, ROUTINE W REFLEX MICROSCOPIC    EKG None  Radiology CT ABDOMEN PELVIS W  CONTRAST Result Date: 09/07/2023 CLINICAL DATA:  Right lower quadrant abdominal pain. EXAM: CT ABDOMEN AND PELVIS WITH CONTRAST TECHNIQUE: Multidetector CT imaging of the abdomen and pelvis was performed using the standard protocol following bolus administration of intravenous contrast. RADIATION DOSE REDUCTION: This exam was performed according to the departmental dose-optimization program which includes automated exposure control, adjustment of the mA and/or kV according to patient size and/or use of iterative reconstruction technique. CONTRAST:  OMNIPAQUE IOHEXOL 300 MG/ML  SOLN COMPARISON:  11/18/2019. FINDINGS: Lower chest: Chronic bronchiectasis and fibrotic changes. No acute findings. Hepatobiliary: No focal liver abnormality is seen. No gallstones, gallbladder wall thickening, or biliary dilatation. Pancreas: Unremarkable. No pancreatic ductal dilatation or surrounding inflammatory changes. Spleen: Normal in size without focal abnormality. Adrenals/Urinary Tract: Normal adrenal glands. 2.3 cm cyst, midpole the right kidney. Several other tiny cortical lesions bilaterally also consistent with cysts. No follow-up recommended. No stones. No hydronephrosis. Ureters are normal in course and in caliber. Bladder is unremarkable. Stomach/Bowel: Stomach is unremarkable. Small bowel normal in caliber. No wall thickening or inflammation. Colon is normal in caliber. Numerous diverticula throughout the colon without evidence of diverticulitis. No wall thickening or other inflammatory process. Normal appendix visualized. Mid transverse colon extends into a infraumbilical midline hernia, base measuring 4.9 cm. No evidence bowel incarceration or strangulation. Appearance is similar to the prior CT. Vascular/Lymphatic: Aortic atherosclerosis. No aneurysm. No enlarged lymph nodes. Reproductive: Status post hysterectomy. No adnexal masses. Other: No other hernias.  No ascites. Musculoskeletal: No fracture or acute  finding.  No bone lesion. IMPRESSION: 1. No acute findings within the abdomen or pelvis. No evidence of appendicitis. 2. Infraumbilical midline hernia containing a loop of transverse colon. No evidence of incarceration or strangulation. Appearance is similar to the prior CT. 3. Extensive colonic diverticulosis without evidence of diverticulitis. Electronically Signed   By: Amie Portland M.D.   On: 09/07/2023 15:42    Procedures Procedures    Medications Ordered in ED Medications - No data  to display  ED Course/ Medical Decision Making/ A&P                                 Medical Decision Making Amount and/or Complexity of Data Reviewed Labs: ordered. Radiology: ordered.  Risk Prescription drug management.   Edrina Araya Bene 73 y.o. presented today for abd pain. Working DDx includes, but not limited to, gastroenteritis, colitis, SBO, appendicitis, cholecystitis, hepatobiliary pathology, gastritis, PUD, ACS, dissection, pancreatitis, nephrolithiasis, AAA, UTI, pyelonephritis.   Review of prior external notes: Reviewed past medical history including visit note from 08/12/2023  Pmhx: hypertension, sarcoid, CHF, history of pulmonary embolism on Eliquis, GERD, C. difficile, cerebral aneurysm, GI bleed, peptic ulcer disease  Unique Tests and My Interpretation:  CBC with differential: No leukocytosis, no anemia CMP: No electrolyte abnormality.  Patient does have elevation in AST, ALT and alk phos. Patient reports a history of elevated liver enzymes in the past.  Discussed these findings and recommended follow up.  Lipase: 26  UA: Negative for nitrates, negative for leukocytes.  Wet prep is negative   Imaging: CT Abd/Pelvis with contrast: evaluate for structural/surgical etiology of patients' severe abdominal pain -does show large midline hernia without any evidence of strangulation or incarceration.  No sign of acute appendicitis.  Diverticulosis without sign of  diverticulitis.   Problem List / ED Course / Critical interventions / Medication management  Reporting to emergency room with abdominal pain and diarrhea.  Patient reports she had a [place] [employer] last night.  Friend who ate at this takeout is also sick.  Labs reassuring here, UA and wet prep normal.  Abdominal imaging without any acute finding.  Patient hemodynamically stable throughout stay.  No fever and no tachycardia.  Pain controlled well in emergency room with no episodes of nausea, vomiting or diarrhea.  Discussed that patient had elevated liver enzymes as well as chronic findings on CT.  Patient will follow-up with her pulmonology in regard to new medications that may be elevating her liver enzymes as well as having them rechecked with primary care. She reports her pain was well-controlled after taking at home pain medication.  Offered pain and medicine however patient declined. Patients vitals assessed. Upon arrival patient is hemodynamically stable.  I have reviewed the patients home medicines and have made adjustments as needed   Consult: None   Plan:  Discharge. Stable.  All questions answered.  Patient will return to emergency room with new or worsening symptoms.  Follow-up with primary care.         Final Clinical Impression(s) / ED Diagnoses Final diagnoses:  Enteritis  Elevated liver enzymes    Rx / DC Orders ED Discharge Orders     None         Smitty Knudsen, PA-C 09/07/23 1816    Wynetta Fines, MD 09/07/23 2201

## 2023-09-07 NOTE — ED Notes (Signed)
ED PA at BS 

## 2023-09-07 NOTE — ED Notes (Signed)
Alert, NAD, calm, interactive, steady gait. Up to b/r, attempting new /2nd urine sample, 1st was QNS.

## 2023-09-07 NOTE — ED Triage Notes (Addendum)
Pt reports with right sided abdominal pain that started this morning. Pt states that the pain radiates to her back. Pt reports having diarrhea last night.

## 2023-09-07 NOTE — ED Notes (Signed)
Ambulatory to b/r, "ready to go", steady gait.

## 2023-09-07 NOTE — Discharge Instructions (Signed)
Seen in emergency room today for abdominal pain related to diarrhea.  Make sure you are staying well-hydrated with water you can alternate Pedialyte.  I would also recommend bland diet like toast, applesauce and bananas.  Please help with your primary care to discuss to follow-up with liver enzymes as well as to make sure symptoms are improving.  Your liver enzymes were elevated today, please recheck as discussed with primary care.  Please follow-up with pulmonology as well as we are concerned this is related to your medication for IPF.   Return to emergency room with new or worsening symptoms.

## 2023-09-07 NOTE — ED Notes (Signed)
Alert, NAD, calm, interactive, talking on her smart watch, pending CT. Pt updated.

## 2023-09-07 NOTE — ED Notes (Signed)
Alert, NAD, calm, interactive, pt to CT

## 2023-09-09 ENCOUNTER — Ambulatory Visit: Payer: Self-pay | Admitting: Licensed Clinical Social Worker

## 2023-09-09 NOTE — Patient Outreach (Signed)
  Care Coordination   Follow Up Visit Note   09/09/2023 Name: [name] MRN: [id number] DOB: [birth year]  [name] is a 73 y.o. year old female who sees Barnie Mort, [place] for primary care. I spoke with  [name] by phone today.  What matters to the patients health and wellness today?  Patient has some stress in managing medical needs    Goals Addressed             This Visit's Progress    Patient stated she has stress in managing medical needs.       Interventions:    Spoke with client via phone today about her current needs. She said she had completed Pulmonary Rehab and that it had been very helpful to her Client said she likes to go to gym  to exercise when she is able Discussed support. Client has some support from her son Discussed medication procurement of client. Discussed sleeping issues of client. Client has C-PAP she uses to help her sleep Discussed meal procurement of client Discuss transport needs of client. Client drives her car as needed to appointments and to complete errands Discussed client support with PA Discussed pain issues of client. Client spoke of hip pain issue Client said she uses inhaler as prescribed. She uses nebulizer as prescribed. She said she uses oxygen occasionally when needed Provided counseling support Informed client of program support with RN, LCSW, Pharmacist LCSW thanked client for phone call with LCSW today Encouraged client to call LCSW as needed for SW support at [phone].          SDOH assessments and interventions completed:  Yes  SDOH Interventions Today    Flowsheet Row Most Recent Value  SDOH Interventions   Depression Interventions/Treatment  Counseling  Physical Activity Interventions Other (Comments)  [likes to go to gym to exercise when she is able]  Stress Interventions Provide Counseling  [financial stressors,  stress in managing medical needs]        Care Coordination  Interventions:  Yes, provided    Interventions Today    Flowsheet Row Most Recent Value  Chronic Disease   Chronic disease during today's visit Other  [spoke with client about client needs]  General Interventions   General Interventions Discussed/Reviewed General Interventions Discussed, Community Resources  Education Interventions   Education Provided Provided Education  Provided Engineer, [employer]  Mental Health Interventions   Mental Health Discussed/Reviewed Coping Strategies  [using coping skills to manage medical needs]  Nutrition Interventions   Nutrition Discussed/Reviewed Nutrition Discussed  Pharmacy Interventions   Pharmacy Dicussed/Reviewed Pharmacy Topics Discussed       Follow up plan: Follow up call scheduled for 11/11/23 at 10:30 AM     Encounter Outcome:  Patient Visit Completed   Lorna Few  MSW, LCSW [place] Licensed Clinical Social Worker Direct Dial:  [phone] Fax:  [phone] Website:  [email or web address]

## 2023-09-09 NOTE — Patient Instructions (Signed)
Visit Information  Thank you for taking time to visit with me today. Please don't hesitate to contact me if I can be of assistance to you.   Following are the goals we discussed today:   Goals Addressed             This Visit's Progress    Patient stated she has stress in managing medical needs.       Interventions:    Spoke with client via phone today about her current needs. She said she had completed Pulmonary Rehab and that it had been very helpful to her Client said she likes to go to gym  to exercise when she is able Discussed support. Client has some support from her son Discussed medication procurement of client. Discussed sleeping issues of client. Client has C-PAP she uses to help her sleep Discussed meal procurement of client Discuss transport needs of client. Client drives her car as needed to appointments and to complete errands Discussed client support with PA Discussed pain issues of client. Client spoke of hip pain issue Client said she uses inhaler as prescribed. She uses nebulizer as prescribed. She said she uses oxygen occasionally when needed Provided counseling support Informed client of program support with RN, LCSW, Pharmacist LCSW thanked client for phone call with LCSW today Encouraged client to call LCSW as needed for SW support at [phone].          Our next appointment is by telephone on 11/11/23 at 10:30 AM   Please call the care guide team at [phone] if you need to cancel or reschedule your appointment.   If you are experiencing a Mental Health or Behavioral Health Crisis or need someone to talk to, please go to [facility] [address] [place] [phone])   The patient verbalized understanding of instructions, educational materials, and care plan provided today and DECLINED offer to receive copy of patient instructions, educational materials, and care plan.   The patient has been provided  with contact information for the care management team and has been advised to call with any health related questions or concerns.    Lorna Few  MSW, LCSW [place] Licensed Clinical Social Worker Direct Dial:  [phone] Fax:  [phone] Website:  [email or web address]
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
## 2023-09-11 ENCOUNTER — Telehealth: Payer: Medicare Other | Admitting: Neurology

## 2023-09-11 DIAGNOSIS — G40209 Localization-related (focal) (partial) symptomatic epilepsy and epileptic syndromes with complex partial seizures, not intractable, without status epilepticus: Secondary | ICD-10-CM

## 2023-09-11 DIAGNOSIS — R252 Cramp and spasm: Secondary | ICD-10-CM

## 2023-09-11 DIAGNOSIS — G894 Chronic pain syndrome: Secondary | ICD-10-CM

## 2023-09-11 DIAGNOSIS — M545 Low back pain, unspecified: Secondary | ICD-10-CM | POA: Diagnosis not present

## 2023-09-11 DIAGNOSIS — Z79899 Other long term (current) drug therapy: Secondary | ICD-10-CM

## 2023-09-11 DIAGNOSIS — G8929 Other chronic pain: Secondary | ICD-10-CM | POA: Diagnosis not present

## 2023-09-11 NOTE — Patient Instructions (Signed)
Great to see you today.  I will continue Onfi for seizure prevention. Next month, we will plan to try to send in a 16-month supply. Continue to see orthopedics PRN for low back pain ESI, keep follow up with Dr. Berline Chough. Call for any seizures, return here in 1 year.Thanks

## 2023-09-14 ENCOUNTER — Other Ambulatory Visit: Payer: Self-pay | Admitting: Internal Medicine

## 2023-09-17 ENCOUNTER — Encounter: Payer: Self-pay | Admitting: Pulmonary Disease

## 2023-09-17 ENCOUNTER — Ambulatory Visit: Payer: Medicare Other | Admitting: Pulmonary Disease

## 2023-09-17 ENCOUNTER — Ambulatory Visit: Payer: Medicare Other | Attending: Physician Assistant | Admitting: Physician Assistant

## 2023-09-17 VITALS — BP 112/62 | HR 86 | Temp 97.8°F | Ht 66.5 in | Wt 214.6 lb

## 2023-09-17 DIAGNOSIS — G4733 Obstructive sleep apnea (adult) (pediatric): Secondary | ICD-10-CM

## 2023-09-17 DIAGNOSIS — Z01810 Encounter for preprocedural cardiovascular examination: Secondary | ICD-10-CM

## 2023-09-17 DIAGNOSIS — R7989 Other specified abnormal findings of blood chemistry: Secondary | ICD-10-CM

## 2023-09-17 NOTE — Patient Instructions (Signed)
I will see you back in about 6 months  Continue using your CPAP on a nightly basis  Call us with significant concerns  I did order for your liver function test to be checked in about 2 to 3 weeks Call ahead to confirm appointment so that you are not sitting around for the blood work

## 2023-09-17 NOTE — Progress Notes (Signed)
Virtual Visit via Telephone Note   Because of [name]'s co-morbid illnesses, she is at least at moderate risk for complications without adequate follow up.  This format is felt to be most appropriate for this patient at this time.  The patient did not have access to video technology/had technical difficulties with video requiring transitioning to audio format only (telephone).  All issues noted in this document were discussed and addressed.  No physical exam could be performed with this format.  Please refer to the patient's chart for her consent to telehealth for [name].  Evaluation Performed:  Preoperative cardiovascular risk assessment _____________   Date:  09/17/2023   Patient ID:  [name], DOB Aug 03, 1951, MRN [id number] Patient Location:  [place] Provider location:   Office  Primary Care Provider:  Barnie Mort, PA-C Primary Cardiologist:  Adriana Muss, MD  Chief Complaint / Patient Profile   73 y.o. y/o female with a h/o   HFmrEF (heart failure with mildly reduced ejection fraction)  Non-ischemic cardiomyopathy  LHC 02/2012: No CAD 2013: EF 35-40 Myoview 04/13/2020: EF 50, normal perfusion, low risk TTE 07/2021: EF 50-55 TTE 04/03/2023: EF 40-45, global HK, GR 1 DD, normal RVSF, mild elevated PASP, mild LAE, trivial MR, RAP 3, RVSP 38 EF decreased in setting of acute PE Coronary artery calcification by CT Patent Foramen Ovale  Ventricular tachycardia S/p ICD ILD (Interstitial Lung Disease)  Breast CA Hx of cerebral aneurysm s/p clip Hypertension Hyperlipidemia OSA Hx of pulmonary embolism 03/2023 Hx of CVA  She is pending excision of fat necrosis under general anesthesia with Dr. Carolynne [name] and presents today for telephonic preoperative cardiovascular risk assessment.  There is a request to hold her Eliquis.  Her anticoagulation is managed by pulmonology (Dr. Marchelle [name]).    History of Present Illness    [name] is a 73 y.o. female who presents via audio/video conferencing for a telehealth visit today.  Pt was last seen in cardiology clinic on 03/26/2023 by [name].  At that time [name] was doing well.  The patient is now pending procedure as outlined above. Since her last visit, she has remained stable.  She did go to the emergency room 09/07/2023 with abdominal pain diagnosed as enteritis.  This is resolved.  She has been attending pulmonary rehab.  Her shortness of breath has significantly improved.  She has not had chest pain.    Physical Exam    Vital Signs:  [name] does not have vital signs available for review today.  Given telephonic nature of communication, physical exam is limited. AAOx3. NAD. Normal affect.  Speech and respirations are unlabored.  Accessory Clinical Findings    None    Assessment & Plan    Assessment & Plan Preoperative cardiovascular examination Ms. Pongratz's perioperative risk of a major cardiac event is 6.6% according to the Revised Cardiac Risk Index (RCRI).  Therefore, she is at high risk for perioperative complications.   Her functional capacity is fair at 4.4 METs according to the Duke Activity Status Index (DASI). Recommendations: According to ACC/AHA guidelines, no further cardiovascular testing needed.  The patient may proceed to surgery at acceptable risk.   Antiplatelet and/or Anticoagulation Recommendations: Anticoagulation is managed by her pulmonologist. Recommendations will need to come from that provider.   The patient was advised that if she develops new symptoms prior to surgery to contact our office to arrange for a follow-up visit, and she  verbalized understanding.  A copy of this note will be routed to requesting surgeon.  Time:   Today, I have spent 6 minutes with the patient with telehealth technology discussing medical history, symptoms, and management plan.     Tereso Newcomer, PA-C  09/17/2023, 10:26 AM

## 2023-09-17 NOTE — Progress Notes (Signed)
[name]    [id number]    09-May-1951  Primary Care Physician:Jackson, Rondel Oh, PA-C  Referring Physician: [name], NP [address] [place],  [place] [address]  Chief complaint:   Patient with obstructive sleep apnea on CPAP therapy  HPI:  Patient with obstructive sleep apnea on CPAP therapy Currently on auto CPAP 5-15 with 2 L valve impacting his system  Has been tolerating CPAP well with no significant difficulty  Follows up with Dr. [name] for pulmonary fibrosis  Was recently in the emergency department, had LFTs performed which showed mild-moderate elevations -We did review previous lab work which showed elevation previously, she does not fully collect whether anything was done with medication dosing previously  Was diagnosed with obstructive sleep apnea about 8 to 9 years ago recently had COVID in July and followed by pulmonary embolism She does use oxygen with activity  Admits to dryness of the mouth in the morning Occasional headaches  Usually goes to bed at about 3 AM Wakes up between 11 and 12 midday  She feels rested when she wakes up She continues to snore and wakes herself up snoring sometimes  DME company is [employer]  Outpatient Encounter Medications as of 09/17/2023  Medication Sig   albuterol (VENTOLIN HFA) 108 (90 Base) MCG/ACT inhaler Inhale 2 puffs into the lungs every 6 (six) hours as needed for wheezing or shortness of breath.   apixaban (ELIQUIS) 5 MG TABS tablet Take 1 tablet (5 mg total) by mouth 2 (two) times daily.   azelastine (ASTELIN) 0.1 % nasal spray Place 1 spray into both nostrils 2 (two) times daily. Use in each nostril as directed   benzonatate (TESSALON) 200 MG capsule Take 1 capsule (200 mg total) by mouth 3 (three) times daily as needed for cough.   budesonide-formoterol (SYMBICORT) 80-4.5 MCG/ACT inhaler Inhale 1 puff into the lungs 2 (two) times daily.   Cholecalciferol (DIALYVITE VITAMIN D  5000) 125 MCG (5000 UT) capsule Take 5,000 Units by mouth daily.   cloBAZam (ONFI) 10 MG tablet Take 1 tablet (10 mg total) by mouth 2 (two) times daily.   diltiazem (CARDIZEM CD) 120 MG 24 hr capsule TAKE 1 CAPSULE EVERY DAY   empagliflozin (JARDIANCE) 10 MG TABS tablet Take 1 tablet (10 mg total) by mouth daily.   famotidine (PEPCID) 40 MG tablet TAKE 1 TABLET AT BEDTIME (NEED MD APPOINTMENT FOR REFILLS)   fexofenadine (ALLEGRA) 180 MG tablet Take 180 mg by mouth daily as needed for allergies (alternates with zyrtec sometimes).   fluticasone (FLONASE) 50 MCG/ACT nasal spray Place 2 sprays into both nostrils daily as needed for allergies or rhinitis.   FOLIC ACID PO Take 1 tablet by mouth daily.   furosemide (LASIX) 20 MG tablet TAKE 2 TABLETS (40 MG) 3 DAYS PER WEEK AND TAKE 1 TABLET (20 MG) THE OTHER DAYS OF THE WEEK   hydroquinone 4 % cream Apply 1 Application topically at bedtime.   hydroxypropyl methylcellulose / hypromellose (ISOPTO TEARS / GONIOVISC) 2.5 % ophthalmic solution Place 1 drop into both eyes at bedtime.   latanoprost (XALATAN) 0.005 % ophthalmic solution Place 1 drop into both eyes at bedtime.   levocetirizine (XYZAL) 5 MG tablet Take 5 mg by mouth daily at 12 noon.   levothyroxine (SYNTHROID) 50 MCG tablet TAKE 1 TABLET EVERY DAY   magnesium oxide (MAG-OX) 400 (240 Mg) MG tablet TAKE 1 TABLET TWICE DAILY   montelukast (SINGULAIR)  10 MG tablet TAKE 1 TABLET AT BEDTIME   Multiple Vitamins-Minerals (MULTIVITAMIN PO) Take 1 tablet by mouth daily.   oxyCODONE (OXY IR/ROXICODONE) 5 MG immediate release tablet Take 0.5-1 tablets (2.5-5 mg total) by mouth 2 (two) times daily as needed for severe pain (pain score 7-10).   pantoprazole (PROTONIX) 40 MG tablet TAKE 1 TABLET EVERY DAY   Pirfenidone 267 MG TABS Take 3 tablets (801 mg total) by mouth 3 (three) times daily with meals.   polyethylene glycol powder (GLYCOLAX/MIRALAX) powder Take 17 grams by mouth daily   potassium chloride  (KLOR-CON M) 10 MEQ tablet TAKE 1/2 TABLET EVERY DAY   promethazine-dextromethorphan (PROMETHAZINE-DM) 6.25-15 MG/5ML syrup Take 5 mLs by mouth 4 (four) times daily as needed for cough.   REPATHA SURECLICK 140 MG/ML SOAJ INJECT 140 MG INTO THE SKIN EVERY 14 DAYS   Respiratory Therapy Supplies (FLUTTER) DEVI 1 Device by Does not apply route as needed.   sodium chloride HYPERTONIC 3 % nebulizer solution Take by nebulization 2 (two) times daily as needed for cough. 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   tiZANidine (ZANAFLEX) 4 MG tablet Take 0.5-1 tablets (2-4 mg total) by mouth 2 (two) times daily.   traZODone (DESYREL) 100 MG tablet TAKE 1 TO 1 AND 1/2 TABLETS AT BEDTIME   Facility-Administered Encounter Medications as of 09/17/2023  Medication   lidocaine (XYLOCAINE) 1 % (with pres) injection 1 mL   lidocaine (XYLOCAINE) 1 % (with pres) injection 3 mL    Allergies as of 09/17/2023 - Review Complete 09/17/2023  Allergen Reaction Noted   Ace inhibitors Swelling 02/18/2014   Amitriptyline hcl Other (See Comments) 04/18/2007   Atorvastatin Other (See Comments) 04/18/2007   Dilantin [phenytoin sodium extended] Rash 02/19/2012   Duloxetine hcl Nausea Only and Other (See Comments) 12/09/2018   Paroxetine Nausea Only 04/18/2007   Ramipril Other (See Comments) 12/01/2014   Rosuvastatin Other (See Comments) 04/18/2007   Carbamazepine Rash 04/18/2007   Codeine Itching 04/18/2007   Phenytoin sodium extended Rash 02/19/2012   Simvastatin Other (See Comments) 04/20/2021   Wellbutrin [bupropion] Palpitations 02/24/2018    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   Bronchiectasis (HCC)    Cerebral aneurysm, nonruptured    had a clip put in   CHF (congestive heart failure) (HCC)    Clostridium difficile infection    Depressive  disorder, not elsewhere classified    Diverticulosis of colon (without mention of hemorrhage)    Esophageal reflux    Fatty liver    Fibromyalgia    Gastritis    GERD (gastroesophageal reflux disease)    GI bleed 2004   Glaucoma    Hiatal hernia    History of COVID-19 05/04/2021   Hyperlipidemia    Hypertension    Hypothyroidism    Internal hemorrhoids    Joint pain    Multifocal pneumonia 06/03/2021   Obstructive sleep apnea (adult) (pediatric)    Osteoarthritis    Ostium secundum type atrial septal defect    Other chronic nonalcoholic liver disease    Other pulmonary embolism and infarction    Palpitations    Paroxysmal ventricular tachycardia (HCC)    Personal history of radiation therapy    PONV (postoperative nausea and vomiting)    Presence of permanent cardiac pacemaker  PUD (peptic ulcer disease)    Radiation 02/03/15-03/10/15   Right Breast   Sarcoid    per pt , not sure   Schatzki's ring    Shortness of breath    Sleep apnea    Stroke [facility]) 2013   tia/ pt feels it was around 2008 0r 2009   Takotsubo syndrome    Tubular adenoma of colon    Unspecified transient cerebral ischemia    Unspecified vitamin D deficiency     Past Surgical History:  Procedure Laterality Date   ABDOMINAL HYSTERECTOMY  11/22/1998   ABI  2006   normal   BREAST EXCISIONAL BIOPSY     BREAST LUMPECTOMY Right 2016   BREAST LUMPECTOMY WITH RADIOACTIVE SEED LOCALIZATION Right 01/03/2015   Procedure: BREAST LUMPECTOMY WITH RADIOACTIVE SEED LOCALIZATION;  Surgeon: Chevis Pretty III, MD;  Location: [facility] OR;  Service: General;  Laterality: Right;   CARDIAC DEFIBRILLATOR PLACEMENT  2006; 2012   BSX single chamber ICD   carotid dopplers  2006   neg   CEREBRAL ANEURYSM REPAIR  02/1999   COLONOSCOPY     EYE SURGERY     HAMMER TOE SURGERY  11/15/2020   3rd digit bilateral feet    hospitalization  2004   GI bleed, PUD, diverticulosis (EGD,colonscopy)   hospitalization     PE, NSVT, s/p defib    JOINT REPLACEMENT  05/2021   KNEE ARTHROSCOPY     bilateral   LEFT HEART CATHETERIZATION WITH CORONARY ANGIOGRAM N/A 02/22/2012   Procedure: LEFT HEART CATHETERIZATION WITH CORONARY ANGIOGRAM;  Surgeon: Kathleene Hazel, MD;  Location: [facility] CATH LAB;  Service: Cardiovascular;  Laterality: N/A;   PARTIAL HYSTERECTOMY     Fibroids   TONGUE BIOPSY  12/12/2017   due to sore tongue and white patches/abnormal cells   TOTAL KNEE ARTHROPLASTY Left 02/13/2016   Procedure: TOTAL KNEE ARTHROPLASTY;  Surgeon: Dannielle Huh, MD;  Location: [facility] OR;  Service: Orthopedics;  Laterality: Left;   TOTAL KNEE ARTHROPLASTY Right 05/22/2021   Procedure: TOTAL KNEE ARTHROPLASTY;  Surgeon: Dannielle Huh, MD;  Location: [facility] ORS;  Service: Orthopedics;  Laterality: Right;  with block   TUBAL LIGATION  09/20/1978   UPPER GASTROINTESTINAL ENDOSCOPY      Family History  Problem Relation Age of Onset   Diabetes Mother    Alzheimer's disease Mother    Hypertension Mother    Obesity Mother    Arthritis Mother    Heart disease Father    Seizures Sister    Allergies Sister    Heart disease Sister    Early death Sister    Other Brother        Thyroid problem 10/2016   Hearing loss Brother    Vision loss Brother    Heart disease Brother    Prostate cancer Brother 27       same brother as throat cancer   Throat cancer Brother        dx in his 75s; also a smoker   Arthritis Brother    Hypertension Brother    Cancer Maternal Grandmother 13       colon cancer or abdominal cancer   Lung cancer Maternal Grandfather 45   Heart disease Son        Cardiac Arrest 07/2016   Breast cancer Maternal Aunt 55   Colon cancer Maternal Aunt 66       same sister as breast at 45   Breast cancer Maternal Aunt  dx in her 44s   Lung cancer Maternal Uncle    Pancreatic cancer Cousin 36       maternal first cousin   Breast cancer Cousin        paternal first cousin twice removed died in her 69s   Anxiety disorder Sister     Arthritis Sister    Depression Sister    Hypertension Sister    Stroke Neg Hx     Social History   Socioeconomic History   Marital status: Widowed    Spouse name: Not on file   Number of children: 2   Years of education: 14   Highest education level: Associate degree: occupational, Scientist, product/process development, or vocational program  Occupational History   Occupation: DISABLED  Tobacco Use   Smoking status: Former    Current packs/day: 0.00    Average packs/day: 1 pack/day for 20.0 years (20.0 ttl pk-yrs)    Types: Cigarettes    Start date: 08/20/1969    Quit date: 08/20/1989    Years since quitting: 34.0    Passive exposure: Past   Smokeless tobacco: Never  Vaping Use   Vaping status: Never Used  Substance and Sexual Activity   Alcohol use: Not Currently    Alcohol/week: 1.0 standard drink of alcohol    Types: 1 Glasses of wine per week    Comment: occasionally, not on a weekly basis   Drug use: Not Currently    Frequency: 1.0 times per week    Comment: CBD gummy occ; marijuana occasionally   Sexual activity: Not Currently    Birth control/protection: Abstinence  Other Topics Concern   Not on file  Social History Narrative   Lives alone   Caffeine ZOX:WRUE   Retired from [employer]   2 children -- 5 grandbabies - 1 great-grandbaby    Social Drivers of Corporate investment banker Strain: Low Risk  (09/10/2023)   Received from [facility]   Overall Financial Resource Strain (CARDIA)    Difficulty of Paying Living Expenses: Not hard at all  Food Insecurity: No Food Insecurity (09/10/2023)   [facility]   Hunger Vital Sign    Worried About Running Out of Food in the Last Year: Never true    Ran Out of Food in the Last Year: Never true  Transportation Needs: No Transportation Needs (09/10/2023)   [facility] from [facility] - Transportation    Lack of Transportation (Medical): No    Lack of Transportation (Non-Medical): No  Physical Activity:  Insufficiently Active (09/09/2023)   Exercise Vital Sign    Days of Exercise per Week: 2 days    Minutes of Exercise per Session: 10 min  Stress: Stress Concern Present (09/09/2023)   [employer] of Occupational Health - Occupational Stress Questionnaire    Feeling of Stress : To some extent  Social Connections: Somewhat Isolated (06/04/2023)   Received from [facility]   Social Network    How would you rate your social network (family, work, friends)?: Restricted participation with some degree of social isolation  Intimate Partner Violence: Not At Risk (06/04/2023)   Received from [facility]   HITS    Over the last 12 months how often did your partner physically hurt you?: Never    Over the last 12 months how often did your partner insult you or talk down to you?: Never    Over the last 12 months how often did your partner threaten you with physical  harm?: Never    Over the last 12 months how often did your partner scream or curse at you?: Never    Review of Systems  Respiratory:  Positive for apnea and shortness of breath.   Psychiatric/Behavioral:  Positive for sleep disturbance.     Vitals:   09/17/23 1317  BP: 112/62  Pulse: 86  Temp: 97.8 F (36.6 C)  SpO2: 95%     Physical Exam Constitutional:      Appearance: She is obese.  HENT:     Head: Normocephalic.     Nose: Nose normal.     Mouth/Throat:     Mouth: Mucous membranes are moist.  Eyes:     Pupils: Pupils are equal, round, and reactive to light.  Cardiovascular:     Rate and Rhythm: Normal rate and regular rhythm.     Heart sounds: No murmur heard.    No friction rub.  Pulmonary:     Effort: No respiratory distress.     Breath sounds: No stridor. Rales present. No wheezing or rhonchi.  Musculoskeletal:     Cervical back: No rigidity or tenderness.  Neurological:     Mental Status: She is alert.  Psychiatric:        Mood and Affect: Mood normal.     Data Reviewed: Sleep study was from  2017-reviewed by myself showing mild obstructive sleep apnea with mild oxygen desaturations  Compliance shows 100% compliance Residual AHI of 4.5  Pulmonary function test does show severely reduced diffusing capacity of 48%, FVC of 72, FEV1 of 87%  Assessment:  History of mild obstructive sleep apnea -Encouraged to continue using CPAP CPAP appears to be working well  History of pulmonary fibrosis -On pirfenidone, follows up with Dr. [name] -Has been compliant with use  History of bronchiectasis  Plan/Recommendations:  Encouraged to continue CPAP use, with 2 L of oxygen piped into the system  Encouraged to use CPAP nightly  Follow-up in about 6 months  Call us with significant concerns  Check of her liver enzymes which was elevated during recent ED visit  Will continue to follow-up with Dr. [name] regarding her pulmonary fibrosis-she has an appointment in March 2025  [facility] 09/17/2023, 1:22 PM  CC: [name], NP

## 2023-09-17 NOTE — Telephone Encounter (Signed)
Copy of this encounter faxed to [facility]

## 2023-09-17 NOTE — Telephone Encounter (Signed)
Notes faxed to surgeon. This phone note will be removed from the preop pool. Tereso Newcomer, PA-C  09/17/2023 10:28 AM

## 2023-09-18 ENCOUNTER — Other Ambulatory Visit: Payer: Self-pay

## 2023-09-18 NOTE — Progress Notes (Signed)
Specialty Pharmacy Refill Coordination Note  [name] is a 73 y.o. female contacted today regarding refills of specialty medication(s) Pirfenidone   Patient requested Delivery   Delivery date: 09/25/23   Verified address: [address]   [place] [address]   Medication will be filled on 09/24/23.

## 2023-09-23 ENCOUNTER — Other Ambulatory Visit: Payer: Medicare Other

## 2023-09-23 ENCOUNTER — Encounter: Payer: Self-pay | Admitting: Registered Nurse

## 2023-09-23 ENCOUNTER — Encounter: Payer: Medicare Other | Attending: Physical Medicine and Rehabilitation | Admitting: Registered Nurse

## 2023-09-23 VITALS — BP 102/69 | HR 83 | Ht 66.5 in | Wt 213.0 lb

## 2023-09-23 DIAGNOSIS — M545 Low back pain, unspecified: Secondary | ICD-10-CM | POA: Insufficient documentation

## 2023-09-23 DIAGNOSIS — G4709 Other insomnia: Secondary | ICD-10-CM | POA: Diagnosis present

## 2023-09-23 DIAGNOSIS — M25552 Pain in left hip: Secondary | ICD-10-CM | POA: Insufficient documentation

## 2023-09-23 DIAGNOSIS — G894 Chronic pain syndrome: Secondary | ICD-10-CM | POA: Diagnosis present

## 2023-09-23 DIAGNOSIS — M542 Cervicalgia: Secondary | ICD-10-CM | POA: Insufficient documentation

## 2023-09-23 DIAGNOSIS — M25512 Pain in left shoulder: Secondary | ICD-10-CM | POA: Insufficient documentation

## 2023-09-23 DIAGNOSIS — G8929 Other chronic pain: Secondary | ICD-10-CM | POA: Insufficient documentation

## 2023-09-23 NOTE — Progress Notes (Unsigned)
Subjective:    Patient ID: [name], female    DOB: 1951-03-26, 73 y.o.   MRN: [id number]  HPI: [name] is a 73 y.o. female who returns for follow up appointment for chronic pain and medication refill. She states her pain is located in her neck, left shoulder, lower back ald left buttock pain. She rates her pain 5. Her current exercise regime is walking and using the elliptical three days a week for 45 minutes.   [name] Morphine equivalent is 14.67 MME.  [name] is using her Oxycodone sparingly   [name] .et     Pain Inventory Average Pain 5 Pain Right Now 5 My pain is intermittent, dull, and aching  In the last 24 hours, has pain interfered with the following? General activity 0 Relation with others 0 Enjoyment of life 4 What TIME of day is your pain at its worst? morning , daytime, evening, night, and varies Sleep (in general) Fair  Pain is worse with: walking, bending, standing, and some activites Pain improves with: therapy/exercise, medication, and injections Relief from Meds: 6  Family History  Problem Relation Age of Onset   Diabetes Mother    Alzheimer's disease Mother    Hypertension Mother    Obesity Mother    Arthritis Mother    Heart disease Father    Seizures Sister    Allergies Sister    Heart disease Sister    Early death Sister    Other Brother        Thyroid problem 10/2016   Hearing loss Brother    Vision loss Brother    Heart disease Brother    Prostate cancer Brother 69       same brother as throat cancer   Throat cancer Brother        dx in his 71s; also a smoker   Arthritis Brother    Hypertension Brother    Cancer Maternal Grandmother 53       colon cancer or abdominal cancer   Lung cancer Maternal Grandfather 2   Heart disease Son        Cardiac Arrest 07/2016   Breast cancer Maternal Aunt 55   Colon cancer Maternal Aunt 30       same sister as breast at 56   Breast cancer Maternal Aunt        dx in her  82s   Lung cancer Maternal Uncle    Pancreatic cancer Cousin 52       maternal first cousin   Breast cancer Cousin        paternal first cousin twice removed died in her 13s   Anxiety disorder Sister    Arthritis Sister    Depression Sister    Hypertension Sister    Stroke Neg Hx    Social History   Socioeconomic History   Marital status: Widowed    Spouse name: Not on file   Number of children: 2   Years of education: 14   Highest education level: Associate degree: occupational, Scientist, product/process development, or vocational program  Occupational History   Occupation: DISABLED  Tobacco Use   Smoking status: Former    Current packs/day: 0.00    Average packs/day: 1 pack/day for 20.0 years (20.0 ttl pk-yrs)    Types: Cigarettes    Start date: 08/20/1969    Quit date: 08/20/1989    Years since quitting: 34.1    Passive exposure: Past   Smokeless tobacco: Never  Vaping Use   Vaping status: Never Used  Substance and Sexual Activity   Alcohol use: Not Currently    Alcohol/week: 1.0 standard drink of alcohol    Types: 1 Glasses of wine per week    Comment: occasionally, not on a weekly basis   Drug use: Not Currently    Frequency: 1.0 times per week    Comment: CBD gummy occ; marijuana occasionally   Sexual activity: Not Currently    Birth control/protection: Abstinence  Other Topics Concern   Not on file  Social History Narrative   Lives alone   Caffeine ZOX:WRUE   Retired from [employer]   2 children -- 5 grandbabies - 1 great-grandbaby    Social Drivers of Corporate investment banker Strain: Low Risk  (09/10/2023)   Received from [facility]   Overall Financial Resource Strain (CARDIA)    Difficulty of Paying Living Expenses: Not hard at all  Food Insecurity: No Food Insecurity (09/10/2023)   Received from [facility]   Hunger Vital Sign    Worried About Running Out of Food in the Last Year: Never true    Ran Out of Food in the Last Year: Never true  Transportation Needs: No  Transportation Needs (09/10/2023)   Received from [facility] - Transportation    Lack of Transportation (Medical): No    Lack of Transportation (Non-Medical): No  Physical Activity: Insufficiently Active (09/09/2023)   Exercise Vital Sign    Days of Exercise per Week: 2 days    Minutes of Exercise per Session: 10 min  Stress: Stress Concern Present (09/09/2023)   [employer] of Occupational [facility] - Occupational Stress Questionnaire    Feeling of Stress : To some extent  Social Connections: Somewhat Isolated (06/04/2023)   Received from [facility]   Social Network    How would you rate your social network (family, work, friends)?: Restricted participation with some degree of social isolation   Past Surgical History:  Procedure Laterality Date   ABDOMINAL HYSTERECTOMY  11/22/1998   ABI  2006   normal   BREAST EXCISIONAL BIOPSY     BREAST LUMPECTOMY Right 2016   BREAST LUMPECTOMY WITH RADIOACTIVE SEED LOCALIZATION Right 01/03/2015   Procedure: BREAST LUMPECTOMY WITH RADIOACTIVE SEED LOCALIZATION;  Surgeon: Chevis Pretty III, MD;  Location: [facility] OR;  Service: General;  Laterality: Right;   CARDIAC DEFIBRILLATOR PLACEMENT  2006; 2012   BSX single chamber ICD   carotid dopplers  2006   neg   CEREBRAL ANEURYSM REPAIR  02/1999   COLONOSCOPY     EYE SURGERY     HAMMER TOE SURGERY  11/15/2020   3rd digit bilateral feet    hospitalization  2004   GI bleed, PUD, diverticulosis (EGD,colonscopy)   hospitalization     PE, NSVT, s/p defib   JOINT REPLACEMENT  05/2021   KNEE ARTHROSCOPY     bilateral   LEFT HEART CATHETERIZATION WITH CORONARY ANGIOGRAM N/A 02/22/2012   Procedure: LEFT HEART CATHETERIZATION WITH CORONARY ANGIOGRAM;  Surgeon: Kathleene Hazel, MD;  Location: [facility] CATH LAB;  Service: Cardiovascular;  Laterality: N/A;   PARTIAL HYSTERECTOMY     Fibroids   TONGUE BIOPSY  12/12/2017   due to sore tongue and white patches/abnormal cells   TOTAL KNEE  ARTHROPLASTY Left 02/13/2016   Procedure: TOTAL KNEE ARTHROPLASTY;  Surgeon: Dannielle Huh, MD;  Location: [facility] OR;  Service: Orthopedics;  Laterality: Left;   TOTAL KNEE ARTHROPLASTY Right 05/22/2021  Procedure: TOTAL KNEE ARTHROPLASTY;  Surgeon: Dannielle Huh, MD;  Location: [facility] ORS;  Service: Orthopedics;  Laterality: Right;  with block   TUBAL LIGATION  09/20/1978   UPPER GASTROINTESTINAL ENDOSCOPY     Past Surgical History:  Procedure Laterality Date   ABDOMINAL HYSTERECTOMY  11/22/1998   ABI  2006   normal   BREAST EXCISIONAL BIOPSY     BREAST LUMPECTOMY Right 2016   BREAST LUMPECTOMY WITH RADIOACTIVE SEED LOCALIZATION Right 01/03/2015   Procedure: BREAST LUMPECTOMY WITH RADIOACTIVE SEED LOCALIZATION;  Surgeon: Chevis Pretty III, MD;  Location: [facility] OR;  Service: General;  Laterality: Right;   CARDIAC DEFIBRILLATOR PLACEMENT  2006; 2012   BSX single chamber ICD   carotid dopplers  2006   neg   CEREBRAL ANEURYSM REPAIR  02/1999   COLONOSCOPY     EYE SURGERY     HAMMER TOE SURGERY  11/15/2020   3rd digit bilateral feet    hospitalization  2004   GI bleed, PUD, diverticulosis (EGD,colonscopy)   hospitalization     PE, NSVT, s/p defib   JOINT REPLACEMENT  05/2021   KNEE ARTHROSCOPY     bilateral   LEFT HEART CATHETERIZATION WITH CORONARY ANGIOGRAM N/A 02/22/2012   Procedure: LEFT HEART CATHETERIZATION WITH CORONARY ANGIOGRAM;  Surgeon: Kathleene Hazel, MD;  Location[facility] CATH LAB;  Service: Cardiovascular;  Laterality: N/A;   PARTIAL HYSTERECTOMY     Fibroids   TONGUE BIOPSY  12/12/2017   due to sore tongue and white patches/abnormal cells   TOTAL KNEE ARTHROPLASTY Left 02/13/2016   Procedure: TOTAL KNEE ARTHROPLASTY;  Surgeon: Dannielle Huh, MD;  Location: [facility] OR;  Service: Orthopedics;  Laterality: Left;   TOTAL KNEE ARTHROPLASTY Right 05/22/2021   Procedure: TOTAL KNEE ARTHROPLASTY;  Surgeon: Dannielle Huh, MD;  Location: [facility] ORS;  Service: Orthopedics;  Laterality: Right;  with block    TUBAL LIGATION  09/20/1978   UPPER GASTROINTESTINAL ENDOSCOPY     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   Bronchiectasis (HCC)    Cerebral aneurysm, nonruptured    had a clip put in   CHF (congestive heart failure) (HCC)    Clostridium difficile infection    Depressive disorder, not elsewhere classified    Diverticulosis of colon (without mention of hemorrhage)    Esophageal reflux    Fatty liver    Fibromyalgia    Gastritis    GERD (gastroesophageal reflux disease)    GI bleed 2004   Glaucoma    Hiatal hernia    History of COVID-19 05/04/2021   Hyperlipidemia    Hypertension    Hypothyroidism    Internal hemorrhoids    Joint pain    Multifocal pneumonia 06/03/2021   Obstructive sleep apnea (adult) (pediatric)    Osteoarthritis    Ostium secundum type atrial septal defect    Other chronic nonalcoholic liver disease    Other pulmonary embolism and infarction    Palpitations    Paroxysmal ventricular tachycardia (HCC)    Personal history of radiation therapy    PONV (postoperative nausea and vomiting)    Presence of permanent cardiac pacemaker    PUD (peptic ulcer disease)    Radiation 02/03/15-03/10/15   Right Breast   Sarcoid    per pt , not sure  Schatzki's ring    Shortness of breath    Sleep apnea    Stroke [facility]) 2013   tia/ pt feels it was around 2008 0r 2009   Takotsubo syndrome    Tubular adenoma of colon    Unspecified transient cerebral ischemia    Unspecified vitamin D deficiency    Ht 5' 6.5" (1.689 m)   Wt 213 lb (96.6 kg)   LMP  (LMP Unknown)   BMI 33.86 kg/m   Opioid Risk Score:   Fall Risk Score:  `1  Depression screen [facility] 2/9     09/23/2023    1:27 PM 09/09/2023    3:32 PM 08/15/2023    1:38 PM 08/12/2023    1:43 PM 07/16/2023    3:27 PM 06/18/2023     2:53 PM 06/03/2023    1:08 PM  Depression screen PHQ 2/9  Decreased Interest 0 1 0 1 1 1 2   Down, Depressed, Hopeless 0 1 1 1 1 1 2   PHQ - 2 Score 0 2 1 2 2 2 4   Altered sleeping  1 1   1 3   Tired, decreased energy  1 1  1 1 2   Change in appetite  0 0  1 1 2   Feeling bad or failure about yourself   1 1  1 1 2   Trouble concentrating  1 1  1 1 1   Moving slowly or fidgety/restless  1 0  1 1 1   Suicidal thoughts  0 0  0 0 0  PHQ-9 Score  7 5   8 15   Difficult doing work/chores  Somewhat difficult Somewhat difficult  Somewhat difficult Somewhat difficult Somewhat difficult    Review of Systems  Musculoskeletal:  Positive for back pain and neck pain.       Left buttock pain,  left  hip pain  All other systems reviewed and are negative.      Objective:   Physical Exam        Assessment & Plan:

## 2023-09-27 ENCOUNTER — Other Ambulatory Visit (HOSPITAL_COMMUNITY): Payer: Self-pay

## 2023-10-09 ENCOUNTER — Other Ambulatory Visit: Payer: Self-pay | Admitting: Internal Medicine

## 2023-10-09 NOTE — Progress Notes (Signed)
 Remote ICD transmission.

## 2023-10-10 MED ORDER — EMPAGLIFLOZIN 10 MG PO TABS: 10.0000 mg | ORAL_TABLET | Freq: Every day | ORAL | 0 refills | Status: DC

## 2023-10-11 ENCOUNTER — Ambulatory Visit (HOSPITAL_BASED_OUTPATIENT_CLINIC_OR_DEPARTMENT_OTHER): Payer: Medicare Other | Admitting: Cardiology

## 2023-10-16 ENCOUNTER — Other Ambulatory Visit: Payer: Self-pay

## 2023-10-16 ENCOUNTER — Encounter: Payer: Self-pay | Admitting: Internal Medicine

## 2023-10-16 NOTE — Progress Notes (Signed)
 Specialty Pharmacy Refill Coordination Note  [name] is a 73 y.o. female contacted today regarding refills of specialty medication(s) Pirfenidone   Patient requested Delivery   Delivery date: 10/24/23   Verified address: [address]   [place] [id number]   Medication will be filled on 10/23/23.

## 2023-10-21 ENCOUNTER — Other Ambulatory Visit (HOSPITAL_BASED_OUTPATIENT_CLINIC_OR_DEPARTMENT_OTHER): Payer: Self-pay

## 2023-10-21 DIAGNOSIS — J84112 Idiopathic pulmonary fibrosis: Secondary | ICD-10-CM

## 2023-10-22 ENCOUNTER — Ambulatory Visit (HOSPITAL_BASED_OUTPATIENT_CLINIC_OR_DEPARTMENT_OTHER): Payer: Medicare Other | Admitting: Internal Medicine

## 2023-10-22 DIAGNOSIS — J84112 Idiopathic pulmonary fibrosis: Secondary | ICD-10-CM | POA: Diagnosis not present

## 2023-10-22 LAB — PULMONARY FUNCTION TEST
DL/VA % pred: 103 %
DL/VA: 4.14 ml/min/mmHg/L
DLCO cor % pred: 71 %
DLCO cor: 15.05 ml/min/mmHg
DLCO unc % pred: 73 %
DLCO unc: 15.53 ml/min/mmHg
FEF 25-75 Pre: 3.64 L/s
FEF2575-%Pred-Pre: 198 %
FEV1-%Pred-Pre: 88 %
FEV1-Pre: 2.13 L
FEV1FVC-%Pred-Pre: 125 %
FEV6-%Pred-Pre: 74 %
FEV6-Pre: 2.26 L
FEV6FVC-%Pred-Pre: 104 %
FVC-%Pred-Pre: 70 %
FVC-Pre: 2.26 L
Pre FEV1/FVC ratio: 94 %
Pre FEV6/FVC Ratio: 100 %

## 2023-10-22 NOTE — Progress Notes (Signed)
 Spirometry/DLCO performed today.

## 2023-10-22 NOTE — Patient Instructions (Signed)
 Spirometry/DLCO performed today.

## 2023-10-24 ENCOUNTER — Ambulatory Visit (INDEPENDENT_AMBULATORY_CARE_PROVIDER_SITE_OTHER): Payer: Self-pay | Admitting: Podiatry

## 2023-10-24 ENCOUNTER — Encounter: Payer: Self-pay | Admitting: Podiatry

## 2023-10-24 DIAGNOSIS — L84 Corns and callosities: Secondary | ICD-10-CM | POA: Diagnosis not present

## 2023-10-24 DIAGNOSIS — D689 Coagulation defect, unspecified: Secondary | ICD-10-CM

## 2023-10-25 NOTE — Progress Notes (Signed)
 Subjective:   Patient ID: [name], female   DOB: 73 y.o.   MRN: [id number]   HPI Patient has severe lesion x 3 of each foot first and fifth metatarsal fifth digit that are painful when pressed and make shoe gear difficult   ROS      Objective:  Physical Exam  Neurovascular status intact patient on blood thinner with severe lesion noted in the above-mentioned areas keratotic and painful      Assessment:  Chronic lesion formation with patient on blood thinner and at high risk of cutting herself     Plan:  Sharp sterile debridement of all lesions x 6 by iatrogenic bleeding reappoint routine care

## 2023-10-28 ENCOUNTER — Other Ambulatory Visit (HOSPITAL_COMMUNITY): Payer: Self-pay

## 2023-10-28 ENCOUNTER — Ambulatory Visit: Payer: Medicare Other | Admitting: Internal Medicine

## 2023-10-28 ENCOUNTER — Encounter: Payer: Self-pay | Admitting: Internal Medicine

## 2023-10-28 VITALS — BP 142/83 | HR 77 | Ht 66.5 in | Wt 214.6 lb

## 2023-10-28 DIAGNOSIS — R7989 Other specified abnormal findings of blood chemistry: Secondary | ICD-10-CM | POA: Diagnosis not present

## 2023-10-28 DIAGNOSIS — R062 Wheezing: Secondary | ICD-10-CM

## 2023-10-28 DIAGNOSIS — Z86711 Personal history of pulmonary embolism: Secondary | ICD-10-CM

## 2023-10-28 DIAGNOSIS — Z9109 Other allergy status, other than to drugs and biological substances: Secondary | ICD-10-CM

## 2023-10-28 DIAGNOSIS — J84112 Idiopathic pulmonary fibrosis: Secondary | ICD-10-CM | POA: Diagnosis not present

## 2023-10-28 DIAGNOSIS — Z5181 Encounter for therapeutic drug level monitoring: Secondary | ICD-10-CM | POA: Diagnosis not present

## 2023-10-28 DIAGNOSIS — R0989 Other specified symptoms and signs involving the circulatory and respiratory systems: Secondary | ICD-10-CM

## 2023-10-28 LAB — HEPATIC FUNCTION PANEL
ALT: 42 U/L — ABNORMAL HIGH (ref 0–35)
AST: 43 U/L — ABNORMAL HIGH (ref 0–37)
Albumin: 4.4 g/dL (ref 3.5–5.2)
Alkaline Phosphatase: 173 U/L — ABNORMAL HIGH (ref 39–117)
Bilirubin, Direct: 0.1 mg/dL (ref 0.0–0.3)
Total Bilirubin: 0.4 mg/dL (ref 0.2–1.2)
Total Protein: 7.6 g/dL (ref 6.0–8.3)

## 2023-10-28 LAB — CBC WITH DIFFERENTIAL/PLATELET
Basophils Absolute: 0 10*3/uL (ref 0.0–0.1)
Basophils Relative: 0.8 % (ref 0.0–3.0)
Eosinophils Absolute: 0.2 10*3/uL (ref 0.0–0.7)
Eosinophils Relative: 5.1 % — ABNORMAL HIGH (ref 0.0–5.0)
HCT: 45.6 % (ref 36.0–46.0)
Hemoglobin: 14.6 g/dL (ref 12.0–15.0)
Lymphocytes Relative: 20.1 % (ref 12.0–46.0)
Lymphs Abs: 1 10*3/uL (ref 0.7–4.0)
MCHC: 31.9 g/dL (ref 30.0–36.0)
MCV: 93.3 fl (ref 78.0–100.0)
Monocytes Absolute: 0.4 10*3/uL (ref 0.1–1.0)
Monocytes Relative: 9.2 % (ref 3.0–12.0)
Neutro Abs: 3.1 10*3/uL (ref 1.4–7.7)
Neutrophils Relative %: 64.8 % (ref 43.0–77.0)
Platelets: 201 10*3/uL (ref 150.0–400.0)
RBC: 4.88 Mil/uL (ref 3.87–5.11)
RDW: 13.7 % (ref 11.5–15.5)
WBC: 4.9 10*3/uL (ref 4.0–10.5)

## 2023-10-28 NOTE — Patient Instructions (Addendum)
 IPF (idiopathic pulmonary fibrosis) (HCC) Medication monitoring encounter History of abnormal liver function   -Clinically IPF appears to be stable  -Dizziness has resolved after Pirfenidone (Esbriet) rechallenge in the fall 2024  -Currently tolerating Esbriet well without any side effects although liver function test was elevated in January 2025   Plan -Check liver function test today and INR 10/28/2023  -Be very careful about drinking alcohol and taking oxycodone in the presence of Esbriet because all these things can increase liver function - Do repeat spirometry and DLCO in 4 months  History of 2019 novel coronavirus disease (COVID-19) History of pulmonary embolism April 02, 2023 Throat burning with Pradaxa  -Pradaxa is reported to have greater than 20/25% GI side effects and therefore a throat burning despite PPI and H2 blockade very likely from Pradaxa -Changed to Eliquis in October/November 2024 with relief of the throat side effect.   Plan -Continue Eliquis  Nocturnal wheezing/crackles History of dust mite allergy   - The sound to hear at night is probably a lung crackles and not wheezing   Plan -but we will continue to monitor -Check CBC with differential and blood IgE -Continue allergy/asthma treatment of Symbicort and Singulair, Flonase and Allegra as before -Do dust mite control [see below]   OSA (obstructive sleep apnea)  - Plan - Under the care of  sleep doctor Dr. Val Eagle   Followup  -- 4 months to spirometry and DLCO and return to see Dr. [name] in a 30-minute visit  -Symptom score and exercise hypoxemia test at follow-up   Xxxxxxxxxx   Google Dust Mite Allergy and get info from [employer] Website    Cover mattresses and pillows in zippered dust-proof covers. These covers are made of a material with pores too small to let dust mites and their waste product through. They are also called allergen-impermeable. Plastic or vinyl  covers are the least expensive, but some people find them uncomfortable. You can buy other fabric allergen-impermeable covers from many regular bedding stores.   Wash your sheets and blankets weekly in hot water. You have to wash them in water that's at least 130 F or more to kill dust mites.  Get rid of all types of fabric that mites love and that you cannot easily wash regularly in hot water.   Avoid wall-to-wall carpeting, curtains, blinds, upholstered furniture and down-filled covers and pillows in the bedroom. Put roll-type shades on your windows instead of curtains.  Have someone without a dust mite allergy clean your bedroom. If this is not possible, wear a filtering mask when dusting or vacuuming. Many drug stores carry these items. Dusting and vacuuming stir up dust. So try to do these chores when you can stay out of the bedroom for a while afterward.   Special CERTIFIED filter vacuum cleaners can help to keep mites and mite waste from getting back into the air. CERTIFIED vacuums are scientifically tested and verified as more suitable for making your home healthier.  Vacuuming is not enough to remove all dust mites and their waste. A large amount of the dust mite population may remain because they live deep inside the stuffing of sofas, chairs, mattresses, pillows, and carpeting.  Treat other rooms in your house like your bedroom. Here are more tips:  Avoid wall-to-wall carpeting, if possible. If you do use carpeting, mites don't like the type with a short, tight pile as much. Use washable throw rugs over regularly damp-mopped wood, linoleum or tiled floors. Wash rugs  in hot water whenever possible. Cold water isn't as effective. Dry cleaning kills all dust mites and is also good for removing dust mites from living in fabrics. Keep the humidity in your home less than 50%. Use a dehumidifier and/or air conditioner to do this. Use a CERTIFIED filter with your central furnace and air  conditioning unit. This can help trap dust mites from your entire home. Freestanding air cleaners only filter air in a limited area. Avoid devices that treat air with heat, electrostatic ions, or ozon

## 2023-10-28 NOTE — Progress Notes (Signed)
 OV 08/23/2022 -refer by Dr. Craige Cotta to Dr. [name] at [facility].  Subjective:  [name]: [name], female , DOB: [birth year] , age 73 y.o. , MRN: [id number] , ADDRESS: [address] [place] [address] PCP Adriana Skeans, MD [name] Care Team: Adriana Skeans, MD as PCP - General (Family Medicine) Duke Salvia, MD as PCP - Electrophysiology (Cardiology) Corky Crafts, MD as PCP - Cardiology (Cardiology) Griselda Miner, MD as Consulting Physician (General Surgery) Malachy Mood, MD as Consulting Physician (Hematology) Lurline Hare, MD as Consulting Physician (Radiation Oncology) Donnelly Angelica, RN as Registered Nurse Pershing Proud, RN as Registered Nurse Duke Salvia, MD as Consulting Physician (Cardiology) Hubbard Hartshorn, NP (Inactive) as Nurse Practitioner (Nurse Practitioner) [name], NP as Nurse Practitioner (Nurse Practitioner) Coralyn Helling, MD as Consulting Physician (Pulmonary Disease) Griselda Miner, MD as Consulting Physician (General Surgery) Pollyann Savoy, MD as Consulting Physician (Rheumatology)  This Provider for this visit: Treatment Team:  Attending Provider: Kalman Shan, MD    08/23/2022 -   Chief Complaint  [name] presents with   Consult    Referred for ILD, states DOE     HPI [name] 73 y.o. -referred by Dr. Craige Cotta to Dr. [name] [facility].  History is gained by review of Adriana chart, talking to [name] in [facility] ILD questionnaire that [name] fell.  There is no one accompanying [name].  She has been following with Dr. Craige Cotta at least for Adriana last 3 to 4 years for a diagnosis of bronchiectasis.  She says at baseline she is doing fairly well but then in October 2022 she suffered from COVID-19 and then was hospitalized.  Review of Adriana records indicate that she was hospitalized for total knee replacement of Adriana right knee early August 2022 for 1 day.   Then a few weeks later on 06/03/2021 she was hospitalized for worsening respiratory failure which she believes was because of COVID-19 but review Adriana records indicate that she actually had COVID in September 2022 and was treated with Paxlovid as an outpatient.  At this hospitalization Adriana COVID was negative.  She was actually hypoxemic.  Pulmonary embolism was ruled out.  This admission RVP and COVID were normal.  Procalcitonin was normal.  Final diagnosis was acute on chronic hypoxemic respiratory failure due to bronchiectasis and pulmonary fibrosis.  She says since then she has been gradually weaned of oxygen.  She says overall she is better since Adriana hospitalization October 2022 but she is not back to her baseline from before Adriana hospitalization.  She has dyspnea on exertion.  Most recently November 2023 Dr. Craige Cotta did high-resolution CT scan of Adriana chest that is concerning for honeycombing findings and progressive changes [personally visualized and I do not fully agree].  Therefore she has been referred here.  Pulmonary function testing shows still blood in DLCO but Adriana FVC seems reduced recently.   All Adriana question is as follows  Most recently she is use 2 L of oxygen with exertion.   [name] [facility] Comprehensive ILD Questionnaire  Symptoms:  Other than dyspnea she also has a cough.  Adriana cough is a since December 2023.  Although currently it is not there.   Past Medical History :  -She marked positive for COPD according to history but does not not be correct. -She is have OSA for which she is previous Dr. Craige Cotta - She does have a  longstanding history of acid reflux/hiatal hernia No history of tuberculosis -She has a history of breast cancer with radiation to Adriana right breast in 2018 -Per Adriana records she suffers from chronic pain syndrome with a history of right brain aneurysm clipping 2001 and cervical stenosis. -She has acid reflux disease for which she sees Dr. Rhea Belton -She has  overactive bladder -She has nonischemic cardiomyopathy for which she sees Dr. Graciela Husbands  -EF 35-40% in 2013 but as high as 50-55% in December 2022   ROS:  She has longstanding chronic pain and gait abnormality  FAMILY HISTORY of LUNG DISEASE:  She has arthralgia - She does have dry eyes -She does have heartburn -Denies family history of lung disease  PERSONAL EXPOSURE HISTORY:  -Denies family history of lung disease -She smoked between 1966 in 41 1/2 pack/day.  No marijuana no cocaine no vaping  HOME  EXPOSURE and HOBBY DETAILS :  -Her current home was built in 1973 and she has been in this house since Adriana last 25 years.  She does use a CPAP mask.  She does use a nebulizer machine.  She does do some occasional gardening but otherwise detailed organic antigen history is negative.  OCCUPATIONAL HISTORY (122 questions) : She is to work for [employer] and then got disabled and then retired.  Detail organic and inorganic antigen history exposure at work is negative.  PULMONARY TOXICITY HISTORY (27 items):  -Did get rid Dacian to Adriana right breast in 2018  INVESTIGATIONS: -     CT Chest data - HRCT Nov 2023 -personally visualized.  Not fully sure if that is actually UIP or not.  If that is UIP then that negative prognostic marker.  I am also not sure about progression.  With this allowed to be clarified with multidisciplinary case conference.   Narrative & Impression  CLINICAL DATA:  73 year old female with history of shortness of breath. Evaluate for interstitial lung disease.   EXAM: CT CHEST WITHOUT CONTRAST   TECHNIQUE: Multidetector CT imaging of Adriana chest was performed following Adriana standard protocol without intravenous contrast. High resolution imaging of Adriana lungs, as well as inspiratory and expiratory imaging, was performed.   RADIATION DOSE REDUCTION: This exam was performed according to Adriana departmental dose-optimization program which includes automated exposure  control, adjustment of the mA and/or kV according to [name] size and/or use of iterative reconstruction technique.   COMPARISON:  CTA of Adriana chest 08/30/2021.   FINDINGS: Cardiovascular: Heart size is normal. There is no significant pericardial fluid, thickening or pericardial calcification. There is aortic atherosclerosis, as well as atherosclerosis of Adriana great vessels of Adriana mediastinum and Adriana coronary arteries, including calcified atherosclerotic plaque in Adriana left anterior descending coronary artery. Left-sided pacemaker/AICD device in place with lead tip terminating in Adriana right ventricular apex.   Mediastinum/Nodes: No pathologically enlarged mediastinal or hilar lymph nodes. Please note that accurate exclusion of hilar adenopathy is limited on noncontrast CT scans. Esophagus is unremarkable in appearance. No axillary lymphadenopathy.   Lungs/Pleura: High-resolution images demonstrate widespread but patchy areas of ground-glass attenuation, septal thickening, subpleural reticulation, parenchymal banding, traction bronchiectasis, peripheral bronchiolectasis and extensive honeycombing. These findings have a definitive craniocaudal gradient and are progressive compared to Adriana prior examination from 08/30/2021. Inspiratory and expiratory imaging is unremarkable. No acute confluent consolidative airspace disease. No pleural effusions. No definite suspicious appearing pulmonary nodules or masses are noted.   Upper Abdomen: Aortic atherosclerosis. Colonic diverticulosis noted in Adriana splenic flexure of Adriana colon.  Musculoskeletal: There are no aggressive appearing lytic or blastic lesions noted in Adriana visualized portions of Adriana skeleton.   IMPRESSION: 1. 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 2. Aortic atherosclerosis, in addition to left  anterior descending coronary artery disease. Assessment for potential risk factor modification, dietary therapy or pharmacologic therapy may be warranted, if clinically indicated. 3. Colonic diverticulosis without evidence of acute diverticulitis at this time.   Aortic Atherosclerosis (ICD10-I70.0).     Electronically Signed   By: Trudie Reed M.D.   On: 07/08/2022 10:54     OV 11/22/2022  Subjective:  [name]: [name], female , DOB: [birth year] , age 81 y.o. , MRN: [id number] , ADDRESS: [address] [place] [address] PCP Adriana Skeans, MD [name] Care Team: Adriana Skeans, MD as PCP - General (Family Medicine) Duke Salvia, MD as PCP - Electrophysiology (Cardiology) Corky Crafts, MD as PCP - Cardiology (Cardiology) Griselda Miner, MD as Consulting Physician (General Surgery) Malachy Mood, MD as Consulting Physician (Hematology) Lurline Hare, MD as Consulting Physician (Radiation Oncology) Donnelly Angelica, RN as Registered Nurse Pershing Proud, RN as Registered Nurse Duke Salvia, MD as Consulting Physician (Cardiology) Hubbard Hartshorn, NP (Inactive) as Nurse Practitioner (Nurse Practitioner) [name], NP as Nurse Practitioner (Nurse Practitioner) Coralyn Helling, MD as Consulting Physician (Pulmonary Disease) Griselda Miner, MD as Consulting Physician (General Surgery) Pollyann Savoy, MD as Consulting Physician (Rheumatology)  This Provider for this visit: Treatment Team:  Attending Provider: Kalman Shan, MD    11/22/2022 -   Chief Complaint  [name] presents with   Follow-up    F/u pft 11/15/22. Labs,      HPI [name] 73 y.o. -returns for follow-up.  This is potential ILD workup in progress.  She states in Adriana interim her son who is age 73 did have a heart transplant for congestive heart failure 3 weeks ago at [facility] he is home and is improving.  She is happy about that.  She  also reports that after her COVID she was prescribed Symbicort she is run out of Adriana Symbicort for Adriana last 2 weeks but this is not really bothering her.  She feels stable she has occasional cough.  Be here to review Adriana results.  She had pulmonary function test that shows now a consistent pattern of decline her FVC is declined 9% in Adriana last 4 years.  I went back and visualized all her CT scans of Adriana chest.  Her CT scan in 2013 and a high-resolution CT chest in 2020 look about similar with some bronchiectasis.  However there is dramatic change between 2020 and also Adriana high-resolution CT chest and late 2023.  This seems a lot more traction bronchiectasis.  There is definite craniocaudal gradient.  She has crackles here.  Initially I was not sure if this was classic UIP but clinically this makes sense especially of Adriana lower lobe crackles.  Was supposed to discuss this in Adriana case conference but this did not happen.  Every posted for discussion of Adriana case conference but nevertheless his decline in PFTs and also this change now in Adriana CT scan of Adriana chest.  In Adriana interim his serologies are normal.  I think at Adriana least she has some plan for progressive ILD .  Possible is IPF but I would like to confirm this and discussion  of Adriana case conference.  Because of Adriana progression and Adriana likelihood this IPF we discussed antifibrotic's is being strongly indicated.  Went over Adriana 2 antifibrotic's nintedanib and pirfenidone.  Shows pirfenidone because of Adriana diarrhea side effects with nintedanib.  She has knee pain and be hard to get to Adriana toilet.  In addition she has nonischemic cardiomyopathy and this blackbox warning for MI risk with with nintedanib.  [Even though she does not have ischemic cardiomyopathy].  Told her that pirfenidone associated more with weight loss nausea anorexia.  Both drugs required liver function monitoring.  Generally both drugs have reversible side effects.  In balance which was  pirfenidone.  I did note mild transaminitis in Adriana past will check liver function test again today.   [id number]      OV 01/03/2023  Subjective:  [name]: [name], female , DOB: 01-08-1951 , age 68 y.o. , MRN: [id number] , ADDRESS: [address] [place] [address] PCP Adriana Skeans, MD [name] Care Team: Adriana Skeans, MD as PCP - General (Family Medicine) Duke Salvia, MD as PCP - Electrophysiology (Cardiology) Corky Crafts, MD as PCP - Cardiology (Cardiology) Griselda Miner, MD as Consulting Physician (General Surgery) Malachy Mood, MD as Consulting Physician (Hematology) Lurline Hare, MD as Consulting Physician (Radiation Oncology) Donnelly Angelica, RN as Registered Nurse Pershing Proud, RN as Registered Nurse Duke Salvia, MD as Consulting Physician (Cardiology) Hubbard Hartshorn, NP (Inactive) as Nurse Practitioner (Nurse Practitioner) [name], NP as Nurse Practitioner (Nurse Practitioner) Coralyn Helling, MD as Consulting Physician (Pulmonary Disease) Griselda Miner, MD as Consulting Physician (General Surgery) Pollyann Savoy, MD as Consulting Physician (Rheumatology)  This Provider for this visit: Treatment Team:  Attending Provider: Kalman Shan, MD    01/03/2023 -   Chief Complaint  [name] presents with   Follow-up    F/up wheezing, congestion, cough, sob with exertion.     HPI [name] 73 y.o. -returns for follow-up.  At this visit she was supposed to have started pirfenidone but what happened is that she had abnormal liver enzymes at Adriana time of last visit.  We found that she was taking a lot of Tylenol for Adriana knee pain.  She is now saying she did not take a lot but she actually did.  Her liver enzymes have since normalized as of December 07, 2022.  She is concerned about a high total protein.  She wanted to discuss this.  I did recommend to her that she  discuss this with primary care physician.  Then she brought her back pain.  I also suggested she discuss with primary care physician.  She wants repeat liver enzymes check especially because of total protein which she believes is because of Adriana Repatha.  Meanwhile she continues to be symptomatic.  We discussed in Adriana case conference in Adriana results are below.  I did share these results with [name].     MDD conference Adriana Dandy 2024: Definitely progressive 2013 very mild non specific ILD. 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 NOv 2023: most rcent versus Feb 2020 HRCT. In msost recent Ct  basilar predmon, ILD, Subpleural reticuation with prominent TB. There is clear honeycombing (moderate) in lung base. No air trapping. Progressed sinc2020.  C/w UIP. In 2020: read as alternate diagnosis but Dr Ashley Murrain thinks in retrospect is mild to moderate  probable or c/w UIP with mild honeycombing. Adriana bronchiectasis all traction bronchiectasis  of fibrosis does not need bipsy. Call it IPF. Rx as IPF   We then discussed Adriana antifibrotic's again.  We went over nintedanib or pirfenidone again.  We stuck with Adriana original decision on taking pirfenidone and she is committing to it.  I will send a note to pharmacy.  Will check liver function test today.  03/12/2023 Acute OV : Covid 19 infection  Presents for an acute office visit.  She complains of 1 week of cough, congestion, increased shortness of breath.  She tested positive for COVID-19 on March 08, 2023.  She was treated with a Z-Pak.  She has completed this in full.  Continues to have some ongoing mucus that is thick.  Denies any fever, hemoptysis, chest pain.  She remains on Symbicort twice daily. She is feeling some better. Concerned that her mucus is still thick. Has nasal congestion and drainage. Still feels weak. Appetite is okay, no n/v/d.   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 She was started on Esbriet but unable to start due to elevated liver enzymes . Was  seen by GI diagnosed with fatty liver. Has follow up next month with Dr. [name] for ILD.   Has sleep apnea on CPAP At bedtime . Does not wear oxygen at home.        OV 03/29/2023  Subjective:  [name]: [name], female , DOB: 10-Nov-1950 , age 22 y.o. , MRN: [id number] , ADDRESS: [address] [place] [address] PCP [name], NP [name] Care Team: [name], NP as PCP - General (Nurse Practitioner) Duke Salvia, MD as PCP - Electrophysiology (Cardiology) Corky Crafts, MD as PCP - Cardiology (Cardiology) Griselda Miner, MD as Consulting Physician (General Surgery) Malachy Mood, MD as Consulting Physician (Hematology) Lurline Hare, MD as Consulting Physician (Radiation Oncology) Donnelly Angelica, RN as Registered Nurse Pershing Proud, RN as Registered Nurse Duke Salvia, MD as Consulting Physician (Cardiology) Hubbard Hartshorn, NP (Inactive) as Nurse Practitioner (Nurse Practitioner) [name], NP as Nurse Practitioner (Nurse Practitioner) Coralyn Helling, MD as Consulting Physician (Pulmonary Disease) Griselda Miner, MD as Consulting Physician (General Surgery) Pollyann Savoy, MD as Consulting Physician (Rheumatology)  This Provider for this visit: Treatment Team:  Attending Provider: Kalman Shan, MD    03/29/2023 -   Chief Complaint  [name] presents with   Acute Visit    Cough but not productive, runny nose.     HPI [name] 73 y.o. -acute visit for this IPF [name] who has not been able to start antifibrotic's because of elevated liver enzymes.  Since her last visit she did see Dr. Bess Kinds who could not find an identifiable etiology for Adriana elevated liver enzymes advised a low carbohydrate diet which she has been following.  She comes in because she is acutely ill.  She tells me that she did see Rikki Spearing March 12, 2023 Milinda Pointer reviewed] had acute COVID-19..  Now she is calling  acutely for cough.  She says cough is getting worse for Adriana last 2 days.  This headaches are some back pressure there is also shortness of breath for 2 days she feels some chills but no fever no sputum production.  Therefore she is here.  She does use oxygen at night.  In Adriana past have tried to do excess hypoxemia test but unable to because of her knee issue.  Today she was able to do and she did desaturate to 81%  on a sit/stand test after doing it 10 times.  But I do not have a baseline to compare to.   TEST/EVENTS :  PFT 04/17/12 >> FEV1 2.60 (110%), FEV1% 85, TLC 4.05 (77%), DLCO 44% PFT 01/17/17 >> FEV1 2.38 (111%), FEV1% 90, TLC 3.85 (71%), DLCO 58%, no BD RAST 05/30/17 >> dust mites PFT 10/16/18 >> FEV1 2.27 (111%), FEV1% 90, TLC 4.00 (74%), DLCO 59% PFT 06/08/22 >> FEV1 2.20 (91%), FEV1% 93, TLC 3.76 (69%), DLCO 62%   Serology:  04/02/12 >> ACE 1 10/25/15 >> ESR 40, ACE 32 05/30/17 >> IgA, IgG, IgM normal 10/16/18 >> HP panel negative, ACE 25   Chest Imaging:  CT chest 04/10/12 >> b/l lower lobe cylindrical BTX and some in upper lobes CT angio chest 10/26/15 >> patchy GGO in periphery of lungs b/l, basilar BTX, no PE; no significant change compared to 2013 HRCT chest 10/08/18 >> atherosclerosis, basilar predominant cylindrical BTX, mild centrilobular/paraseptal emphysema, air trapping CT angio chest 03/24/19 >> Chronic lung changes with peribronchial thickening, bibasilar atelectasis and chronic interstitial disease/peripheral fibrosis at Adriana lung bases CT angio chest 06/03/21 >> scattered GGO with fibrotic changes CT angio chest 08/30/21 >> BTX with emphysema and fibrotic changes in periphery HRCT 06/2022 -Progressive ILD-dx of UIP    Sleep Tests:  PSG 11/29/08 >> AHI 9 HST 11/10/15 >> AHI 7.1, SaO2 low 75% CPAP 06/17/21 to 07/16/21 >> used on 27 of 30 nights with average 5 hrs 24 min.  Average AHI 2.5 with CPAP 5 cm H2O.   Cardiac Tests:  Echo 08/11/21 >> EF 50 to 55%, RVSP 39.5  mmHg  PFT  OV 04/11/2023  Subjective:  [name]: [name], female , DOB: Sep 24, 1950 , age 1 y.o. , MRN: [id number] , ADDRESS: [address] [place] [address] PCP [name], NP [name] Care Team: [name], NP as PCP - General (Nurse Practitioner) Duke Salvia, MD as PCP - Electrophysiology (Cardiology) Corky Crafts, MD as PCP - Cardiology (Cardiology) Griselda Miner, MD as Consulting Physician (General Surgery) Malachy Mood, MD as Consulting Physician (Hematology) Lurline Hare, MD as Consulting Physician (Radiation Oncology) Donnelly Angelica, RN as Registered Nurse Pershing Proud, RN as Registered Nurse Duke Salvia, MD as Consulting Physician (Cardiology) Hubbard Hartshorn, NP (Inactive) as Nurse Practitioner (Nurse Practitioner) [name], NP as Nurse Practitioner (Nurse Practitioner) Coralyn Helling, MD as Consulting Physician (Pulmonary Disease) Griselda Miner, MD as Consulting Physician (General Surgery) Pollyann Savoy, MD as Consulting Physician (Rheumatology)  This Provider for this visit: Treatment Team:  Attending Provider: Kalman Shan, MD    04/11/2023 -   Chief Complaint  [name] presents with   Follow-up    F/up on hosp visit, IPF and blood work   HPI [name] 73 y.o. -returns for follow-up.  She was seen acutely in July 2020 for outpatient COVID then in August 2024 she came in acutely what look like bronchitis ended up turning out to be pulmonary embolism.  She did get admitted.  She has been discharged on Pradaxa because Eliquis was expensive.  She is using oxygen 2 L nasal cannula she feels better but he does get tachycardic.  She is worried about Adriana PE.  She is worried about Adriana duration of Pradaxa and Adriana cost associated with it and previous bleeding risk she says she had a bleeding episode 20 years ago.  I did indicate to her that potentially lifelong treatment because a  second  episode versus at least taking for a year and then reassessing.  In terms of IPF LFTs most recently mid August 2024 are normal.  She is agreed to start pirfenidone.  We did a sit/stand hypoxemia test and she did desaturate again.  I did indicate to her this might not be Adriana PE make her desaturate but Adriana IPF.  She asked about transplant I did indicate to her Adriana 66 is at Adriana upper limit plus she has got obesity.  Did indicate to her that we need to make progress with weight loss and then subsequently we went with Adriana basics of IPF management and then we can decide.  She is using nighttime oxygen on her own.  But is never been tested.  Sleep study was ordered before she saw Dr. Val Eagle but because of illness this was canceled.  We will reorder this.  OV 05/23/2023  Subjective:  [name]: [name], female , DOB: 04/14/1951 , age 35 y.o. , MRN: [id number] , ADDRESS: [address] [place] [address] PCP [name], NP [name] Care Team: [name], NP as PCP - General (Nurse Practitioner) Duke Salvia, MD as PCP - Electrophysiology (Cardiology) Corky Crafts, MD as PCP - Cardiology (Cardiology) Griselda Miner, MD as Consulting Physician (General Surgery) Malachy Mood, MD as Consulting Physician (Hematology) Lurline Hare, MD as Consulting Physician (Radiation Oncology) Donnelly Angelica, RN as Registered Nurse Pershing Proud, RN as Registered Nurse Duke Salvia, MD as Consulting Physician (Cardiology) Hubbard Hartshorn, NP (Inactive) as Nurse Practitioner (Nurse Practitioner) [name], NP as Nurse Practitioner (Nurse Practitioner) Coralyn Helling, MD (Inactive) as Consulting Physician (Pulmonary Disease) Griselda Miner, MD as Consulting Physician (General Surgery) Pollyann Savoy, MD as Consulting Physician (Rheumatology)  This Provider for this visit: Treatment Team:  Attending Provider: Kalman Shan, MD    05/23/2023 -   Chief  Complaint  [name] presents with   Followup     She has occ dizzy spells- occurs while at rest. She feels like her breathing has improved since Adriana last visit. She notices "rattling in chest at night". Her cough is mainly non prod.      HPI [name] 73 y.o. 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  She finally got a pirfenidone mid September 2024 based on chart review.  Today she goes up to 3 pills 3 times daily.  She initially said she was tolerating pirfenidone well but then she admitted that ever since she started Adriana pirfenidone she is getting dizzy spells.  These are very transient split-second.  They happen spontaneously.  2-3 times a day.  Could be standing or sitting or lying down.  She does not lose any consciousness.  No focal deficit no seizures no falls no double vision no fever no chills but definitely new definitely mild and definitely since starting pirfenidone.  She is not compliant with her oxygen use.  However her exercise hypoxemia test is better in fact her overall shortness of breath with exertion is better. Want to rehab  # Pulmonary embolism August 2024 she continues Pradaxa.  She tells me now for Adriana first time that ever since starting Pradaxa she is having intense throat burning despite her being on PPI and H2 blockade.  At this straight after taking Pradaxa does not think that with pirfenidone.  Review of Pradaxa side effects is that greater than 25% GI side effects.  She could not afford Eliquis but she is willing to see if this is now affordable.  Meanwhile we will give her a GI cocktail.  I have sent a message to Adriana pharmacy team  #Sleep apnea she is under Adriana care of Dr. Val Eagle   #Other issues.  She will have a high-dose flu shot today.  I also told her that is okay to travel by air.     APP VISIT 06/21/23   History of Present Illness   Today's video visit is 1 month follow-up.  She is followed for IPF.  She has been started on  Esbriet but during last visit was having dizziness felt possibly secondary to Esbriet.  Esbriet was placed on hold until October 12.  She was to restart Esbriet and titrate up slowly as tolerable.  She was also referred to pulmonary rehab.  She would also recommend to continue on oxygen 2 L with activity [name] had previously been advised to stop taking Esbriet due to concerns it was causing dizziness. [name] confirmed that Adriana dizziness resolved after discontinuing Adriana medication, but was unaware of Adriana instruction to restart Adriana medication on October 13th. [name] had previously reached Adriana top dose of Esbriet (three capsules three times daily) . Dizziness did start at lower doses.   In addition to fibrosis, [name] was also dealing with issues related to Pradaxa, a blood thinner, which was causing throat burning. [name] confirmed that switching to Eliquis resolved this issue.  She denies any known bleeding . Education on not using NSAIDS.   [name] is using oxygen with activity and during physical therapy, as her oxygen level sometimes dropped to as low as 88. [name] is taking Symbicort twice daily and using albuterol infrequently. [name] was not very active outside of physical therapy and housework. Likes pulmonary rehab alot.   [name] is  also using a CPAP machine at bedtime and reported feeling rested with its use. [name] had recently been in Adriana ER for chest pain . Workup was unrevealing with neg CT chest for PE or acute process. Labs unrevealing. plans to follow up with cardiology .    [name] had received a pneumonia vaccine in 2022 and was up to date on RSV vaccines.        OV 10/28/2023  Subjective:  [name]: [name], female , DOB: 1951/02/25 , age 6 y.o. , MRN: [id number] , ADDRESS: [address] [place] [address] PCP Barnie Mort, PA-C [name] Care Team: Modesta Messing as PCP - General  (Pediatrics) Duke Salvia, MD as PCP - Electrophysiology (Cardiology) Corky Crafts, MD as PCP - Cardiology (Cardiology) Griselda Miner, MD as Consulting Physician (General Surgery) Malachy Mood, MD as Consulting Physician (Hematology) Lurline Hare, MD as Consulting Physician (Radiation Oncology) Donnelly Angelica, RN as Registered Nurse Pershing Proud, RN as Registered Nurse Duke Salvia, MD as Consulting Physician (Cardiology) Hubbard Hartshorn, NP (Inactive) as Nurse Practitioner (Nurse Practitioner) [name], NP as Nurse Practitioner (Nurse Practitioner) Coralyn Helling, MD (Inactive) as Consulting Physician (Pulmonary Disease) Griselda Miner, MD as Consulting Physician (General Surgery) Pollyann Savoy, MD as Consulting Physician (Rheumatology)  This Provider for this visit: Treatment Team:  Attending Provider: Kalman Shan, MD    10/28/2023 -   Chief Complaint  [name] presents with   Follow-up    She c/o wheezing, esp at night x 2 months. Breathing  has been stable. She has nocturnal cough- non prod.     #IPF [name] unable to start pirfenidone because of previous elevations in liver enzymes deemed as fatty liver by GI  - LAST HRCT Nov 2023  - Last CT (angion) Oc 2024  #Outpatient COVID-19 in July 2024 followed by pulmonary embolism mid August 2024.  [Previous history of VTE]  -Pradaxa changed to Eliquis in fall 2024 after burning in Adriana throat.  #Sleep apnea in Adriana process of transfer from Dr. Craige Cotta to Dr. Val Eagle  #Esbriet/Pirfenidone requires intensive drug monitoring due to high concerns for Adverse effects of , including  Drug Induced Liver Injury, significant GI side effects that include but not limited to Diarrhea, Nausea, Vomiting,  and other system side effects that include Fatigue, headaches, weight loss and other side effects such as skin rash. These will be monitored with  blood work such as LFT initially once a month for 6 months and  then quarterly On   - ELEVated LFT on and off  - Last Jan 2025  HPI [name] 73 y.o. -returns for follow-up.  I last saw her in Adriana fall 2024.  At that time she had some dizziness with pirfenidone we stopped Adriana pirfenidone and then she saw a nurse practitioner.  Nurse practitioner put her on a rechallenge of pirfenidone.  She is currently on full dose pirfenidone and she is tolerating it well without any dizziness.  She reports stable dyspnea.  She reports good tolerance with pirfenidone except mild nausea that is transient.  She had pulmonary function test in March 2025 and it is stable.  Her main issue is that she is noticing "nocturnal wheeze" when she lies down and is in Adriana sofa or in Adriana bed.  However when I played Adriana sound of her wheezing and crackles on YouTube she reported that it was crackles.  She had been reassured by Adriana sleep specialist that this was consistent with Adriana pulmonary fibrosis.  I shared Adriana same opinion.  However lab review shows that in Adriana past in 2018 she had house dust mite allergy.  She is also on Singulair, Symbicort and antihistamines at this point.  I did indicate to her we will check a CBC differential and blood IgE.  Also gave advice on house dust mite control.  Of note in January 2025 she ended up in Adriana ER with enteritis and had elevated liver enzyme.  She requires a follow-up today.  Also of note she wants to know if she can drink alcohol.  I did tell her that we need Adriana liver enzymes checked.  She is also on oxycodone.  I told her that multiple agents that can increase risk of liver injury.  She wanted to know if she could to a marijuana gummy I advised her against this.      SYMPTOM SCALE - ILD 08/23/2022 11/22/2022  05/23/2023  10/28/2023 Esbriet.  Current weight    Pirfenidone -today goes to 3 pills 3 times daily   O2 use x ra ra ra  Shortness of Breath 0 -> 5 scale with 5 being worst (score 6 If unable to do)     At rest 0 2 1 1   Simple  tasks - showers, clothes change, eating, shaving 2 3 3 3   Household (dishes, doing bed, laundry) 1 4 3 3   Shopping 1 3 3 2   Walking level at own pace 2 3 3 2   Walking up Stairs 3 5 5  5  Total (30-36) Dyspnea Score 9 20 18 16     Non-dyspnea symptoms (0-> 5 scale) 08/23/2022 01/03/2023  05/23/2023  10/28/2023   How bad is your cough? 0 3 3 2   How bad is your fatigue 0 2 3 1   How bad is nausea 00 0 0 0  How bad is vomiting?  0 0 0 0  How bad is diarrhea? 0 0 0 1  How bad is anxiety? 2 4 3 2   How bad is depression 2 5 4 2   Any chronic pain - if so where and how bad 1 x do spirometry and x Okay   Simple office walk 185 feet x  3 laps goal with forehead probe 11/22/2022 Could not do due to Rt Knee pain 03/29/2023 - acute PE  04/11/2023 pradaxa 05/23/2023    O2 used x ra ra Room air   Number laps completed x Sit stna x 10 Sit stand x 10 Sit stand x 10   Comments about pace x      Resting Pulse Ox/HR x% and x/min 92% and HR 107 98% and HR 111 98% with a heart rate of 92   Final Pulse Ox/HR x% and x/min 81% and HR 121 85-88% and HR 118 Briefly 88% for 1 second with a peak heart rate of 103   Desaturated </= 88% x yes     Desaturated <= 3% points x yes     Got Tachycardic >/= 90/min x es     Symptoms at end of test x Level 6 dyspnea Level 5 dyspnea Some days near   Miscellaneous comments xx        PFT     Latest Ref Rng & Units 10/22/2023   11:01 AM 07/15/2023    1:56 PM 02/19/2023   11:26 AM 08/24/2022    1:55 PM 06/08/2022    3:49 PM 10/16/2018   12:55 PM 01/17/2017    2:50 PM  PFT Results  FVC-Pre L 2.26  P 2.30  2.25  2.30  2.33  2.52  2.63   FVC-Predicted Pre % 70  P 72  71  72  73  96  98   FVC-Post L     2.36  2.49  2.52   FVC-Predicted Post %     74  94  94   Pre FEV1/FVC % % 94  P 91  88  92  93  90  90   Post FEV1/FCV % %     93  91  92   FEV1-Pre L 2.13  P 2.08  1.97  2.12  2.17  2.27  2.38   FEV1-Predicted Pre % 88  P 87  82  88  90  111  113   FEV1-Post L     2.20  2.26   2.32   DLCO uncorrected ml/min/mmHg 15.53  P 10.02  11.85  11.05  12.95  12.43  15.90   DLCO UNC% % 73  P 48  56  52  62  59  58   DLCO corrected ml/min/mmHg 15.05  P 10.14  11.85  11.05  12.95   15.80   DLCO COR %Predicted % 71  P 48  56  52  62   57   DLVA Predicted % 103  P 81  94  87  93  85  80   TLC L     3.76  4.00  3.85   TLC % Predicted %  69  74  71   RV % Predicted %     55  58  55     P Preliminary result       LAB RESULTS last 96 hours No results found.       has a past medical history of AICD (automatic cardioverter/defibrillator) present, Allergy (09/20/1994), Anxiety, Arthritis, Back pain, Breast cancer (HCC) (2016), Bronchiectasis (HCC), Cerebral aneurysm, nonruptured, CHF (congestive heart failure) (HCC), Clostridium difficile infection, Depressive disorder, not elsewhere classified, Diverticulosis of colon (without mention of hemorrhage), Esophageal reflux, Fatty liver, Fibromyalgia, Gastritis, GERD (gastroesophageal reflux disease), GI bleed (2004), Glaucoma, Hiatal hernia, History of COVID-19 (05/04/2021), Hyperlipidemia, Hypertension, Hypothyroidism, Internal hemorrhoids, Joint pain, Multifocal pneumonia (06/03/2021), Obstructive sleep apnea (adult) (pediatric), Osteoarthritis, Ostium secundum type atrial septal defect, Other chronic nonalcoholic liver disease, Other pulmonary embolism and infarction, Palpitations, Paroxysmal ventricular tachycardia (HCC), Personal history of radiation therapy, PONV (postoperative nausea and vomiting), Presence of permanent cardiac pacemaker, PUD (peptic ulcer disease), Radiation (02/03/15-03/10/15), Sarcoid, Schatzki's ring, Shortness of breath, Sleep apnea, Stroke (HCC) (2013), Takotsubo syndrome, Tubular adenoma of colon, Unspecified transient cerebral ischemia, and Unspecified vitamin D deficiency.   reports that she quit smoking about 34 years ago. Her smoking use included cigarettes. She started smoking about 54 years ago. She has  a 20 pack-year smoking history. She has been exposed to tobacco smoke. She has never used smokeless tobacco.  Past Surgical History:  Procedure Laterality Date   ABDOMINAL HYSTERECTOMY  11/22/1998   ABI  2006   normal   BREAST EXCISIONAL BIOPSY     BREAST LUMPECTOMY Right 2016   BREAST LUMPECTOMY WITH RADIOACTIVE SEED LOCALIZATION Right 01/03/2015   Procedure: BREAST LUMPECTOMY WITH RADIOACTIVE SEED LOCALIZATION;  Surgeon: Chevis Pretty III, MD;  Location: [facility] OR;  Service: General;  Laterality: Right;   CARDIAC DEFIBRILLATOR PLACEMENT  2006; 2012   BSX single chamber ICD   carotid dopplers  2006   neg   CEREBRAL ANEURYSM REPAIR  02/1999   COLONOSCOPY     EYE SURGERY     HAMMER TOE SURGERY  11/15/2020   3rd digit bilateral feet    hospitalization  2004   GI bleed, PUD, diverticulosis (EGD,colonscopy)   hospitalization     PE, NSVT, s/p defib   JOINT REPLACEMENT  05/2021   KNEE ARTHROSCOPY     bilateral   LEFT HEART CATHETERIZATION WITH CORONARY ANGIOGRAM N/A 02/22/2012   Procedure: LEFT HEART CATHETERIZATION WITH CORONARY ANGIOGRAM;  Surgeon: Kathleene Hazel, MD;  Location: [facility] CATH LAB;  Service: Cardiovascular;  Laterality: N/A;   PARTIAL HYSTERECTOMY     Fibroids   TONGUE BIOPSY  12/12/2017   due to sore tongue and white patches/abnormal cells   TOTAL KNEE ARTHROPLASTY Left 02/13/2016   Procedure: TOTAL KNEE ARTHROPLASTY;  Surgeon: Dannielle Huh, MD;  Location: [facility] OR;  Service: Orthopedics;  Laterality: Left;   TOTAL KNEE ARTHROPLASTY Right 05/22/2021   Procedure: TOTAL KNEE ARTHROPLASTY;  Surgeon: Dannielle Huh, MD;  Location: [facility] ORS;  Service: Orthopedics;  Laterality: Right;  with block   TUBAL LIGATION  09/20/1978   UPPER GASTROINTESTINAL ENDOSCOPY      Allergies  Allergen Reactions   Ace Inhibitors Swelling    Angioedema; makes tongue "break out"    Amitriptyline Hcl Other (See Comments)     makes her too sleepy!   Atorvastatin Other (See Comments)    SEVERE MYALGIA    Dilantin [Phenytoin Sodium Extended] Rash    Severe rash   Duloxetine Hcl  Nausea Only and Other (See Comments)    Sleepiness/ sick  Other Reaction(s): GI Intolerance   Paroxetine Nausea Only    Rapid heartbeat   Ramipril Other (See Comments)    TONGUE ULCERS    Rosuvastatin Other (See Comments)    SEVERE MYALGIA   Carbamazepine Rash   Codeine Itching   Phenytoin Sodium Extended Rash   Simvastatin Other (See Comments)    Increase in CK, myalgias   Wellbutrin [Bupropion] Palpitations    Immunization History  Administered Date(s) Administered   Fluad Quad(high Dose 65+) 04/17/2019, 04/30/2020   Fluad Trivalent(High Dose 65+) 05/23/2023   H1N1 07/24/2008   Influenza Split 07/17/2011, 09/27/2011, 04/29/2012   Influenza Whole 07/20/2004, 07/04/2007, 05/07/2008, 05/19/2009, 05/17/2010   Influenza, High Dose Seasonal PF 05/30/2017, 05/21/2018   Influenza,inj,Quad PF,6+ Mos 05/20/2013, 05/06/2014, 05/17/2015, 05/15/2016, 04/27/2021   Influenza-Unspecified 05/07/2018   PFIZER(Purple Top)SARS-COV-2 Vaccination 09/25/2019, 10/16/2019, 06/04/2020, 01/04/2021, 08/06/2021   Pfizer Covid-19 Vaccine Bivalent Booster 22yrs & up 05/07/2022   Pneumococcal Conjugate-13 11/22/2015, 08/06/2021   Pneumococcal Polysaccharide-23 05/20/2002, 05/07/2008, 06/25/2014   Respiratory Syncytial Virus Vaccine,Recomb Aduvanted(Arexvy) 06/26/2022   Td 09/07/2009   Zoster, Live 09/21/2011    Family History  Problem Relation Age of Onset   Diabetes Mother    Alzheimer's disease Mother    Hypertension Mother    Obesity Mother    Arthritis Mother    Heart disease Father    Seizures Sister    Allergies Sister    Heart disease Sister    Early death Sister    Other Brother        Thyroid problem 10/2016   Hearing loss Brother    Vision loss Brother    Heart disease Brother    Prostate cancer Brother 63       same brother as throat cancer   Throat cancer Brother        dx in his 19s; also a smoker    Arthritis Brother    Hypertension Brother    Cancer Maternal Grandmother 48       colon cancer or abdominal cancer   Lung cancer Maternal Grandfather 78   Heart disease Son        Cardiac Arrest 07/2016   Breast cancer Maternal Aunt 55   Colon cancer Maternal Aunt 61       same sister as breast at 47   Breast cancer Maternal Aunt        dx in her 21s   Lung cancer Maternal Uncle    Pancreatic cancer Cousin 31       maternal first cousin   Breast cancer Cousin        paternal first cousin twice removed died in her 30s   Anxiety disorder Sister    Arthritis Sister    Depression Sister    Hypertension Sister    Stroke Neg Hx      Current Outpatient Medications:    albuterol (VENTOLIN HFA) 108 (90 Base) MCG/ACT inhaler, Inhale 2 puffs into Adriana lungs every 6 (six) hours as needed for wheezing or shortness of breath., Disp: 51 each, Rfl: 1   apixaban (ELIQUIS) 5 MG TABS tablet, Take 1 tablet (5 mg total) by mouth 2 (two) times daily., Disp: 180 tablet, Rfl: 1   azelastine (ASTELIN) 0.1 % nasal spray, Place 1 spray into both nostrils 2 (two) times daily. Use in each nostril as directed, Disp: 90 mL, Rfl: 3   benzonatate (TESSALON) 200 MG capsule, Take 1 capsule (  200 mg total) by mouth 3 (three) times daily as needed for cough., Disp: 90 capsule, Rfl: 2   budesonide-formoterol (SYMBICORT) 80-4.5 MCG/ACT inhaler, Inhale 1 puff into Adriana lungs 2 (two) times daily., Disp: 3 each, Rfl: 4   Cholecalciferol (DIALYVITE VITAMIN D 5000) 125 MCG (5000 UT) capsule, Take 5,000 Units by mouth daily., Disp: , Rfl:    cloBAZam (ONFI) 10 MG tablet, Take 1 tablet (10 mg total) by mouth 2 (two) times daily., Disp: 180 tablet, Rfl: 1   diltiazem (CARDIZEM CD) 120 MG 24 hr capsule, TAKE 1 CAPSULE EVERY DAY, Disp: 90 capsule, Rfl: 2   empagliflozin (JARDIANCE) 10 MG TABS tablet, Take 1 tablet (10 mg total) by mouth daily., Disp: 90 tablet, Rfl: 0   famotidine (PEPCID) 40 MG tablet, TAKE 1 TABLET AT BEDTIME  (NEED MD APPOINTMENT FOR REFILLS), Disp: 90 tablet, Rfl: 3   fexofenadine (ALLEGRA) 180 MG tablet, Take 180 mg by mouth daily as needed for allergies (alternates with zyrtec sometimes)., Disp: , Rfl:    fluticasone (FLONASE) 50 MCG/ACT nasal spray, Place 2 sprays into both nostrils daily as needed for allergies or rhinitis., Disp: 48 g, Rfl: 3   FOLIC ACID PO, Take 1 tablet by mouth daily., Disp: , Rfl:    furosemide (LASIX) 20 MG tablet, TAKE 2 TABLETS (40 MG) 3 DAYS PER WEEK AND TAKE 1 TABLET (20 MG) Adriana OTHER DAYS OF Adriana WEEK, Disp: 130 tablet, Rfl: 2   hydroquinone 4 % cream, Apply 1 Application topically at bedtime., Disp: , Rfl:    hydroxypropyl methylcellulose / hypromellose (ISOPTO TEARS / GONIOVISC) 2.5 % ophthalmic solution, Place 1 drop into both eyes at bedtime., Disp: , Rfl:    latanoprost (XALATAN) 0.005 % ophthalmic solution, Place 1 drop into both eyes at bedtime., Disp: , Rfl:    levocetirizine (XYZAL) 5 MG tablet, Take 5 mg by mouth daily at 12 noon., Disp: , Rfl:    levothyroxine (SYNTHROID) 50 MCG tablet, TAKE 1 TABLET EVERY DAY, Disp: 90 tablet, Rfl: 3   magnesium oxide (MAG-OX) 400 (240 Mg) MG tablet, TAKE 1 TABLET TWICE DAILY, Disp: 180 tablet, Rfl: 3   montelukast (SINGULAIR) 10 MG tablet, TAKE 1 TABLET AT BEDTIME, Disp: 90 tablet, Rfl: 3   Multiple Vitamins-Minerals (MULTIVITAMIN PO), Take 1 tablet by mouth daily., Disp: , Rfl:    oxyCODONE (OXY IR/ROXICODONE) 5 MG immediate release tablet, Take 0.5-1 tablets (2.5-5 mg total) by mouth 2 (two) times daily as needed for severe pain (pain score 7-10)., Disp: 45 tablet, Rfl: 0   pantoprazole (PROTONIX) 40 MG tablet, TAKE 1 TABLET EVERY DAY, Disp: 90 tablet, Rfl: 0   Pirfenidone 267 MG TABS, Take 3 tablets (801 mg total) by mouth 3 (three) times daily with meals., Disp: 270 tablet, Rfl: 4   polyethylene glycol powder (GLYCOLAX/MIRALAX) powder, Take 17 grams by mouth daily, Disp: 1080 g, Rfl: 0   potassium chloride (KLOR-CON M) 10  MEQ tablet, TAKE 1/2 TABLET EVERY DAY, Disp: 45 tablet, Rfl: 2   promethazine-dextromethorphan (PROMETHAZINE-DM) 6.25-15 MG/5ML syrup, Take 5 mLs by mouth 4 (four) times daily as needed for cough., Disp: 118 mL, Rfl: 0   REPATHA SURECLICK 140 MG/ML SOAJ, INJECT 140 MG INTO Adriana SKIN EVERY 14 DAYS, Disp: 6 mL, Rfl: 0   Respiratory Therapy Supplies (FLUTTER) DEVI, 1 Device by Does not apply route as needed., Disp: 1 each, Rfl: 0   sodium chloride HYPERTONIC 3 % nebulizer solution, Take by nebulization 2 (two) times daily  as needed for cough. 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, Disp: 750 mL, Rfl: 12   tiZANidine (ZANAFLEX) 4 MG tablet, Take 0.5-1 tablets (2-4 mg total) by mouth 2 (two) times daily., Disp: 180 tablet, Rfl: 1   traZODone (DESYREL) 100 MG tablet, TAKE 1 TO 1 AND 1/2 TABLETS AT BEDTIME, Disp: 135 tablet, Rfl: 3  Current Facility-Administered Medications:    lidocaine (XYLOCAINE) 1 % (with pres) injection 1 mL, 1 mL, Other, Once, Lovorn, Megan, MD   lidocaine (XYLOCAINE) 1 % (with pres) injection 3 mL, 3 mL, Other, Once, Lovorn, Megan, MD      Objective:   Vitals:   10/28/23 1420  BP: (!) 142/83  Pulse: 77  TempSrc: Oral  SpO2: 100%  Weight: 214 lb 9.6 oz (97.3 kg)  Height: 5' 6.5" (1.689 m)    Estimated body mass index is 34.12 kg/m as calculated from Adriana following:   Height as of this encounter: 5' 6.5" (1.689 m).   Weight as of this encounter: 214 lb 9.6 oz (97.3 kg).  @WEIGHTCHANGE @  [employer]   10/28/23 1420  Weight: 214 lb 9.6 oz (97.3 kg)     Physical Exam ook  General: No distress. Look stabl O2 at rest: no Cane present: no Sitting in wheel chair: no Frail: no Obese: no Neuro: Alert and Oriented x 3. GCS 15. Speech normal Psych: Pleasant Resp:  Barrel Chest - no.  Wheeze - no, Crackles - YES , No overt respiratory distress CVS: Normal heart sounds. Murmurs - no Ext: Stigmata of Connective Tissue Disease - no HEENT: Normal upper airway. PEERL +. No post nasal  drip        Assessment:       ICD-10-CM   1. IPF (idiopathic pulmonary fibrosis) (HCC)  J84.112 CBC w/Diff    Hepatic function panel    IgE    Pulmonary function test    2. Abnormal LFTs  R79.89 CBC w/Diff    Hepatic function panel    IgE    Pulmonary function test    3. Medication monitoring encounter  Z51.81 CBC w/Diff    Hepatic function panel    IgE    Pulmonary function test    4. History of pulmonary embolism  Z86.711 CBC w/Diff    Hepatic function panel    IgE    Pulmonary function test    5. Wheeze  R06.2 CBC w/Diff    Hepatic function panel    IgE    Pulmonary function test    6. Chest crackles  R09.89 CBC w/Diff    Hepatic function panel    IgE    Pulmonary function test    7. House dust mite allergy  Z91.09 CBC w/Diff    Hepatic function panel    IgE    Pulmonary function test         Plan:     [name] Instructions  IPF (idiopathic pulmonary fibrosis) (HCC) Medication monitoring encounter History of abnormal liver function   -Clinically IPF appears to be stable  -Dizziness has resolved after Pirfenidone (Esbriet) rechallenge in Adriana fall 2024  -Currently tolerating Esbriet well without any side effects although liver function test was elevated in January 2025   Plan -Check liver function test today and INR 10/28/2023  -Be very careful about drinking alcohol and taking oxycodone in Adriana presence of Esbriet because all these things can increase liver function - Do repeat spirometry and DLCO in 4 months  History of 2019 novel coronavirus disease (COVID-19)  History of pulmonary embolism April 02, 2023 Throat burning with Pradaxa  -Pradaxa is reported to have greater than 20/25% GI side effects and therefore a throat burning despite PPI and H2 blockade very likely from Pradaxa -Changed to Eliquis in October/November 2024 with relief of Adriana throat side effect.   Plan -Continue Eliquis  Nocturnal wheezing/crackles History of dust mite  allergy   - Adriana sound to hear at night is probably a lung crackles and not wheezing   Plan -but we will continue to monitor -Check CBC with differential and blood IgE -Continue allergy/asthma treatment of Symbicort and Singulair, Flonase and Allegra as before -Do dust mite control [see below]   OSA (obstructive sleep apnea)  - Plan - Under Adriana care of  sleep doctor Dr. Val Eagle   Followup  -- 4 months to spirometry and DLCO and return to see Dr. [name] in a 30-minute visit  -Symptom score and exercise hypoxemia test at follow-up   Xxxxxxxxxx   Google Dust Mite Allergy and get info from [facility] Website    Cover mattresses and pillows in zippered dust-proof covers. These covers are made of a material with pores too small to let dust mites and their waste product through. They are also called allergen-impermeable. Plastic or vinyl covers are Adriana least expensive, but some people find them uncomfortable. You can buy other fabric allergen-impermeable covers from many regular bedding stores.   Wash your sheets and blankets weekly in hot water. You have to wash them in water that's at least 130 F or more to kill dust mites.  Get rid of all types of fabric that mites love and that you cannot easily wash regularly in hot water.   Avoid wall-to-wall carpeting, curtains, blinds, upholstered furniture and down-filled covers and pillows in Adriana bedroom. Put roll-type shades on your windows instead of curtains.  Have someone without a dust mite allergy clean your bedroom. If this is not possible, wear a filtering mask when dusting or vacuuming. Many drug stores carry these items. Dusting and vacuuming stir up dust. So try to do these chores when you can stay out of Adriana bedroom for a while afterward.   Special CERTIFIED filter vacuum cleaners can help to keep mites and mite waste from getting back into Adriana air. CERTIFIED vacuums are scientifically tested and verified as  more suitable for making your home healthier.  Vacuuming is not enough to remove all dust mites and their waste. A large amount of Adriana dust mite population may remain because they live deep inside Adriana stuffing of sofas, chairs, mattresses, pillows, and carpeting.  Treat other rooms in your house like your bedroom. Here are more tips:  Avoid wall-to-wall carpeting, if possible. If you do use carpeting, mites don't like Adriana type with a short, tight pile as much. Use washable throw rugs over regularly damp-mopped wood, linoleum or tiled floors. Wash rugs in hot water whenever possible. Cold water isn't as effective. Dry cleaning kills all dust mites and is also good for removing dust mites from living in fabrics. Keep Adriana humidity in your home less than 50%. Use a dehumidifier and/or air conditioner to do this. Use a CERTIFIED filter with your central furnace and air conditioning unit. This can help trap dust mites from your entire home. Freestanding air cleaners only filter air in a limited area. Avoid devices that treat air with heat, electrostatic ions, or ozon     FOLLOWUP Return in about 4 months (around 02/27/2024)  for 30 min visit, after Cleda Daub and DLCO, with Dr [name], Face to Face Visit.    SIGNATURE    Dr. Kalman Shan, M.D., F.C.C.P,  Pulmonary and Critical Care Medicine Staff Physician, [facility] Director - Interstitial Lung Disease  Program  Pulmonary Fibrosis [facility] at [facility] [place], [place], [address]  Pager: [phone], If no answer or between  15:00h - 7:00h: call [phone] Telephone: [phone]  3:08 PM 10/28/2023

## 2023-10-29 LAB — IGE: IgE (Immunoglobulin E), Serum: 63 kU/L (ref <=114)

## 2023-11-02 ENCOUNTER — Other Ambulatory Visit: Payer: Self-pay | Admitting: Internal Medicine

## 2023-11-02 DIAGNOSIS — E785 Hyperlipidemia, unspecified: Secondary | ICD-10-CM

## 2023-11-02 DIAGNOSIS — I700 Atherosclerosis of aorta: Secondary | ICD-10-CM

## 2023-11-02 DIAGNOSIS — M609 Myositis, unspecified: Secondary | ICD-10-CM

## 2023-11-09 ENCOUNTER — Other Ambulatory Visit: Payer: Self-pay | Admitting: Internal Medicine

## 2023-11-09 DIAGNOSIS — Z86711 Personal history of pulmonary embolism: Secondary | ICD-10-CM

## 2023-11-11 ENCOUNTER — Ambulatory Visit: Payer: Self-pay | Admitting: Licensed Clinical Social Worker

## 2023-11-11 NOTE — Patient Outreach (Signed)
 Care Coordination   Follow Up Visit Note   11/11/2023 Name: [name] MRN: [id number] DOB: May 05, 1951  [name] is a 73 y.o. year old female who sees Barnie Mort, [place] for primary care. I spoke with  [name] by phone today.  What matters to the patients health and wellness today?   Patient has stress in managing medical needs    Goals Addressed             This Visit's Progress    Patient stated she has stress in managing medical needs.       Interventions:    Spoke with client via phone today about her current needs. She said she had completed Pulmonary Rehab and that it had been very helpful to her. She said she is trying to do exercises she learned in Pulmonary Rehab.  Client said she is not walking as much.  She did not mention going to local gym Discussed medication procurement of client. Discussed sleeping issues of client. Client has C-PAP she uses to help her sleep Discussed meal procurement of client. Client said it is hard for her to stand to cook. She may order meals or go out to eat Discuss transport needs of client. Client drives her car as needed to appointments and to complete errands Discussed client support with PA Discussed pain issues of client. Client spoke of hip pain issue Client said she uses inhaler as prescribed. She uses nebulizer as prescribed. She said she uses oxygen occasionally when needed Provided counseling support Informed client of program support with RN, LCSW, Pharmacist Encouraged client to access program support services as needed Regarding medications, client informed LCSW today that she does not take Zoloft. She said she did have prescription for Zoloft filled but has not been taking Zoloft LCSW thanked client for phone call with LCSW today Encouraged client to call LCSW as needed for SW support at [phone].         SDOH assessments and interventions completed:  Yes  SDOH Interventions Today     Flowsheet Row Most Recent Value  SDOH Interventions   Depression Interventions/Treatment  Counseling  Physical Activity Interventions Other (Comments)  [she tries to walk on Treadmill as able. Tries to use stationary bike as able]  Stress Interventions Provide Counseling  [client has some stress in managing medical needs]        Care Coordination Interventions:  Yes, provided   Interventions Today    Flowsheet Row Most Recent Value  Chronic Disease   Chronic disease during today's visit Other  [spoke with client about client needs]  General Interventions   General Interventions Discussed/Reviewed General Interventions Discussed, Community Resources  Education Interventions   Education Provided Provided Education  Provided Verbal Education On [employer]  Mental Health Interventions   Mental Health Discussed/Reviewed Coping Strategies  Nutrition Interventions   Nutrition Discussed/Reviewed Nutrition Discussed  Pharmacy Interventions   Pharmacy Dicussed/Reviewed Pharmacy Topics Discussed  Safety Interventions   Safety Discussed/Reviewed Fall Risk        Follow up plan: LCSW has provided Newt Lukes with LCSW name and phone number . LCSW encouraged client to call LCSW as needed for SW support at [phone]   Encounter Outcome:  Patient Visit Completed    Lorna Few  MSW, LCSW [place] Licensed Clinical Social Worker Direct Dial:  [phone] Fax:  [phone] Website:  [email or web address]

## 2023-11-11 NOTE — Patient Instructions (Signed)
 Visit Information  Thank you for taking time to visit with me today. Please don't hesitate to contact me if I can be of assistance to you.   Following are the goals we discussed today:   Goals Addressed             This Visit's Progress    Patient stated she has stress in managing medical needs.       Interventions:    Spoke with client via phone today about her current needs. She said she had completed Pulmonary Rehab and that it had been very helpful to her. She said she is trying to do exercises she learned in Pulmonary Rehab.  Client said she is not walking as much.  She did not mention going to local gym Discussed medication procurement of client. Discussed sleeping issues of client. Client has C-PAP she uses to help her sleep Discussed meal procurement of client. Client said it is hard for her to stand to cook. She may order meals or go out to eat Discuss transport needs of client. Client drives her car as needed to appointments and to complete errands Discussed client support with PA Discussed pain issues of client. Client spoke of hip pain issue Client said she uses inhaler as prescribed. She uses nebulizer as prescribed. She said she uses oxygen occasionally when needed Provided counseling support Informed client of program support with RN, LCSW, Pharmacist Encouraged client to access program support services as needed Regarding medications, client informed LCSW today that she does not take Zoloft. She said she did have prescription for Zoloft filled but has not been taking Zoloft LCSW thanked client for phone call with LCSW today Encouraged client to call LCSW as needed for SW support at [phone].        LCSW has provided client with LCSW name and phone number. LCSW has encouraged client to call LCSW as needed for SW support at [phone]  Please call the care guide team at [phone] if you need to cancel or reschedule your appointment.   If you are  experiencing a Mental Health or Behavioral Health Crisis or need someone to talk to, please go to [facility] [address], [place] [phone])   The patient verbalized understanding of instructions, educational materials, and care plan provided today and DECLINED offer to receive copy of patient instructions, educational materials, and care plan.   The patient has been provided with contact information for the care management team and has been advised to call with any health related questions or concerns.    Lorna Few  MSW, LCSW [place] Licensed Clinical Social Worker Direct Dial:  [phone] Fax:  [phone] Website:  [email or web address]

## 2023-11-15 ENCOUNTER — Encounter
Payer: Medicare Other | Attending: Physical Medicine and Rehabilitation | Admitting: Physical Medicine and Rehabilitation

## 2023-11-15 ENCOUNTER — Encounter: Payer: Self-pay | Admitting: Physical Medicine and Rehabilitation

## 2023-11-15 ENCOUNTER — Telehealth: Payer: Self-pay

## 2023-11-15 VITALS — BP 131/79 | HR 82 | Ht 66.0 in | Wt 214.0 lb

## 2023-11-15 DIAGNOSIS — M5442 Lumbago with sciatica, left side: Secondary | ICD-10-CM | POA: Diagnosis present

## 2023-11-15 DIAGNOSIS — G894 Chronic pain syndrome: Secondary | ICD-10-CM | POA: Diagnosis not present

## 2023-11-15 DIAGNOSIS — M7918 Myalgia, other site: Secondary | ICD-10-CM | POA: Insufficient documentation

## 2023-11-15 DIAGNOSIS — M5441 Lumbago with sciatica, right side: Secondary | ICD-10-CM | POA: Insufficient documentation

## 2023-11-15 DIAGNOSIS — M25512 Pain in left shoulder: Secondary | ICD-10-CM | POA: Insufficient documentation

## 2023-11-15 DIAGNOSIS — G8929 Other chronic pain: Secondary | ICD-10-CM | POA: Diagnosis present

## 2023-11-15 MED ORDER — TRIAMCINOLONE ACETONIDE 40 MG/ML IJ SUSP
80.0000 mg | Freq: Once | INTRAMUSCULAR | Status: AC
  Administered 2023-11-15: 80 mg via INTRAMUSCULAR

## 2023-11-15 MED ORDER — LIDOCAINE HCL 1 % IJ SOLN
8.0000 mL | Freq: Once | INTRAMUSCULAR | Status: AC
  Administered 2023-11-15: 8 mL

## 2023-11-15 MED ORDER — OXYCODONE HCL 5 MG PO TABS: 2.5000 mg | ORAL_TABLET | Freq: Two times a day (BID) | ORAL | 0 refills | Status: DC | PRN

## 2023-11-15 NOTE — Progress Notes (Signed)
 Patient is a 73 yr old R handed female with hx of  Bronchiesctasis, CHF, prediabetes- with A1c of 5.6- as of 02/15/21- - pt thinks still true- - not on blood thinners, has a defibrillator, GERD, and fibromyalgia issues- dx'd 10 years ago.  Here for f/u on chronic pain and trigger oint injections. Last got subacromial shoulder injections B/L on 03/29/21.  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  Here for f/u on chronic pain.    Last UDS was 12/24  Having problems with L shoulder, back and R wrist.    Trying to walk at [facility]- goes 15 minutes max, without max and L hip feeling terrible.  Oxycodone- makes her so sleepy- so only takes occ- helps her back at night, so can sleep.  Usually ~1-2x/week if that.   Most of back pain is L buttock and low back pain -    Plan: Last UDS 07/2023- so doesn't need UDS today. Per clinic policy- takes Oxycodone rarely, so don't see need to see q2 months.   2. Has to go to Rheumatology for Wrists and hands/finger injections. Might be able to see NP/PA for injections.   3. steroid injection was performed at bicipital tendons on R and RTC posterior shoulder  using 1% plain Lidocaine and 40mg  /1cc of Kenalog. This was well tolerated.  Cleaned with betadine x3 and allowed to dry- then alcohol then injected using 27 gauge 1.5 inch needle- no bleeding or complications.    F/U in 3 months for steroid injections of shoulders/biceps tendons.  Lidocaine will kick in 15 minutes- and wear off tonight- the steroid will kick in tomorrow within 24 hours and take up to 72 hours to fully kick in.  4. Patient here for trigger point injections for  Consent done and on chart.  Cleaned areas with alcohol and injected using a 27 gauge 1.5 inch needle  Injected 2.5cc- wasted 0.5cc Using 1% Lidocaine with no EPI  Upper traps Levators Posterior scalenes Middle scalenes Splenius Capitus Pectoralis Major Rhomboids Infraspinatus Teres  Major/minor Thoracic paraspinals Lumbar paraspinals L lumbar paraspinals x2 Other injections- L buttocks/glue max   Patient's level of pain prior was Current level of pain after injections is  There was no bleeding or complications.  Patient was advised to drink a lot of water on day after injections to flush system Will have increased soreness for 12-48 hours after injections.  Can use Lidocaine patches the day AFTER injections Can use theracane on day of injections in places didn't inject Can use heating pad 4-6 hours AFTER injections   5. Will refill  Oxycodone- last refill 09/03/23 2.5-5 mg as needed for severe pain.  Cannot take tylenol so no Norco allowed- no NSAIDs allowed either due to renal function; ; and has seizures, so cannot take Tramadol.   6. F/U every 3 months- f/u on pain and B/L shoulder injections and trP injections    I spent a total of 29    minutes on total care today- >50% coordination of care- due to d/w pt about chronic pain and treatment- also need for injections- which took 9 minutes total.

## 2023-11-15 NOTE — Telephone Encounter (Signed)
 Patient contacted the office and states she wants to make an appointment to get injections in her right thumb and right wrist. Advised the patient I would send a message to the front to call her back. Patient has not been seen since 02/06/2022. Please advise.

## 2023-11-15 NOTE — Patient Instructions (Signed)
 Plan: Last UDS 07/2023- so doesn't need UDS today. Per clinic policy  2. Has to go to Rheumatology for Wrists and hands/finger injections.   3. steroid injection was performed at bicip[facility] tendons on R and RTC posterior shoulder  using 1% plain Lidocaine and 40mg  /1cc of Kenalog. This was well tolerated.  Cleaned with betadine x3 and allowed to dry- then alcohol then injected using 27 gauge 1.5 inch needle- no bleeding or complications.    F/U in 3 months for steroid injections of shoulders/biceps tendons.  Lidocaine will kick in 15 minutes- and wear off tonight- the steroid will kick in tomorrow within 24 hours and take up to 72 hours to fully kick in.  4. Patient here for trigger point injections for  Consent done and on chart.  Cleaned areas with alcohol and injected using a 27 gauge 1.5 inch needle  Injected 2.5cc- wasted 0.5cc Using 1% Lidocaine with no EPI  Upper traps Levators Posterior scalenes Middle scalenes Splenius Capitus Pectoralis Major Rhomboids Infraspinatus Teres Major/minor Thoracic paraspinals Lumbar paraspinals L lumbar paraspinals x2 Other injections- L buttocks/glue max   Patient's level of pain prior was Current level of pain after injections is  There was no bleeding or complications.  Patient was advised to drink a lot of water on day after injections to flush system Will have increased soreness for 12-48 hours after injections.  Can use Lidocaine patches the day AFTER injections Can use theracane on day of injections in places didn't inject Can use heating pad 4-6 hours AFTER injections   5. Will refill  Oxycodone- last refill 09/03/23 2.5-5 mg as needed for severe pain.  Cannot take tylenol; and on seizures- since cannot take Tramadol.   6. F/U every 3 months- f/u on pain and B/L shoulder injections and trP injections

## 2023-11-15 NOTE — Addendum Note (Signed)
 Addended by: Silas Sacramento T on: 11/15/2023 03:45 PM   Modules accepted: Orders
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
## 2023-11-19 ENCOUNTER — Other Ambulatory Visit: Payer: Self-pay

## 2023-11-21 ENCOUNTER — Encounter: Payer: Self-pay | Admitting: Podiatry

## 2023-11-21 ENCOUNTER — Ambulatory Visit (INDEPENDENT_AMBULATORY_CARE_PROVIDER_SITE_OTHER)

## 2023-11-21 ENCOUNTER — Ambulatory Visit (INDEPENDENT_AMBULATORY_CARE_PROVIDER_SITE_OTHER): Admitting: Podiatry

## 2023-11-21 DIAGNOSIS — M10471 Other secondary gout, right ankle and foot: Secondary | ICD-10-CM | POA: Diagnosis not present

## 2023-11-21 DIAGNOSIS — M109 Gout, unspecified: Secondary | ICD-10-CM

## 2023-11-21 DIAGNOSIS — M869 Osteomyelitis, unspecified: Secondary | ICD-10-CM

## 2023-11-21 DIAGNOSIS — M7751 Other enthesopathy of right foot: Secondary | ICD-10-CM | POA: Diagnosis not present

## 2023-11-21 MED ORDER — COLCHICINE 0.6 MG PO TABS: ORAL_TABLET | ORAL | 3 refills | Status: DC

## 2023-11-21 MED ORDER — DOXYCYCLINE HYCLATE 100 MG PO TABS: 100.0000 mg | ORAL_TABLET | Freq: Two times a day (BID) | ORAL | 0 refills | Status: DC

## 2023-11-22 ENCOUNTER — Telehealth: Payer: Self-pay

## 2023-11-22 ENCOUNTER — Other Ambulatory Visit: Payer: Self-pay | Admitting: Pharmacy Technician

## 2023-11-22 ENCOUNTER — Other Ambulatory Visit: Payer: Self-pay

## 2023-11-22 ENCOUNTER — Other Ambulatory Visit: Payer: Self-pay | Admitting: Internal Medicine

## 2023-11-22 DIAGNOSIS — M109 Gout, unspecified: Secondary | ICD-10-CM

## 2023-11-22 DIAGNOSIS — J84112 Idiopathic pulmonary fibrosis: Secondary | ICD-10-CM

## 2023-11-22 DIAGNOSIS — M869 Osteomyelitis, unspecified: Secondary | ICD-10-CM

## 2023-11-22 LAB — CBC WITH DIFFERENTIAL/PLATELET
Absolute Lymphocytes: 1166 {cells}/uL (ref 850–3900)
Absolute Monocytes: 498 {cells}/uL (ref 200–950)
Basophils Absolute: 32 {cells}/uL (ref 0–200)
Basophils Relative: 0.5 %
Eosinophils Absolute: 101 {cells}/uL (ref 15–500)
Eosinophils Relative: 1.6 %
HCT: 46.2 % — ABNORMAL HIGH (ref 35.0–45.0)
Hemoglobin: 14.9 g/dL (ref 11.7–15.5)
MCH: 29.9 pg (ref 27.0–33.0)
MCHC: 32.3 g/dL (ref 32.0–36.0)
MCV: 92.6 fL (ref 80.0–100.0)
MPV: 10.5 fL (ref 7.5–12.5)
Monocytes Relative: 7.9 %
Neutro Abs: 4505 {cells}/uL (ref 1500–7800)
Neutrophils Relative %: 71.5 %
Platelets: 229 10*3/uL (ref 140–400)
RBC: 4.99 10*6/uL (ref 3.80–5.10)
RDW: 12.2 % (ref 11.0–15.0)
Total Lymphocyte: 18.5 %
WBC: 6.3 10*3/uL (ref 3.8–10.8)

## 2023-11-22 LAB — URIC ACID: Uric Acid, Serum: 3.4 mg/dL (ref 2.5–7.0)

## 2023-11-22 LAB — SEDIMENTATION RATE: Sed Rate: 2 mm/h (ref 0–30)

## 2023-11-22 LAB — RHEUMATOID FACTOR: Rheumatoid fact SerPl-aCnc: <10 [IU]/mL (ref <14)

## 2023-11-22 LAB — C-REACTIVE PROTEIN: CRP: 3.6 mg/L (ref <8.0)

## 2023-11-22 LAB — ANA: Anti Nuclear Antibody (ANA): NEGATIVE

## 2023-11-22 MED ORDER — PIRFENIDONE 267 MG PO TABS
801.0000 mg | ORAL_TABLET | Freq: Three times a day (TID) | ORAL | 4 refills | Status: DC
  Filled 2023-11-22: qty 270, 30d supply, fill #0
  Filled 2023-12-17: qty 270, 30d supply, fill #1
  Filled 2024-01-20: qty 270, 30d supply, fill #2
  Filled 2024-02-10: qty 270, 30d supply, fill #3
  Filled 2024-03-16: qty 270, 30d supply, fill #4

## 2023-11-22 NOTE — Telephone Encounter (Signed)
 [facility] called stating that per tech CT w/wo order needs to be changed to CT with contrast due to mass.

## 2023-11-22 NOTE — Telephone Encounter (Signed)
 Changed to CT with contrast

## 2023-11-22 NOTE — Progress Notes (Signed)
 Specialty Pharmacy Refill Coordination Note  [name] is a 73 y.o. female contacted today regarding refills of specialty medication(s) Pirfenidone   Patient requested Delivery   Delivery date: 11/27/23   Verified address: [address]  [place]   Medication will be filled on 11/26/23.  RR sent to MD

## 2023-11-24 NOTE — Progress Notes (Signed)
 She presents today with a painful third digit of the right foot.  73 year old female does not recall any trauma to the right foot.  She does not recall whether she woke up with the toe hurting.  She states that her toe hurts so badly that she cannot even let the sheets to touch it.  She denies history of gout.  Objective: Vital signs are stable she is alert and oriented x 3.  Third toe on the right foot does demonstrate considerable edema and symptoms particularly out over the DIPJ area.  It is moderately tender on medial-lateral compression overlying the DIPJ.  No purulence from beneath the nail and the nail is not loose does not demonstrate any type of paronychia.  Radiographs taken today do demonstrate what appears to be osseous destruction within the intermediate phalanx with near complete osteolysis.  This could be infection this could be a crystalline arthritic episode.  Assessment cannot rule out tumors complications third digit right foot.  Cannot rule out destructive crystalline arthritis.  Plan: Regarding request a CBC arthritic profile I am going to start her on colchicine and doxycycline were going to request a CT with contrast to rule out osteomyelitis or expansile tumor.  I would request an MRI but she has aneurysm clips and pacemaker.

## 2023-11-25 ENCOUNTER — Other Ambulatory Visit: Payer: Self-pay | Admitting: Internal Medicine

## 2023-11-25 DIAGNOSIS — E785 Hyperlipidemia, unspecified: Secondary | ICD-10-CM

## 2023-11-25 DIAGNOSIS — M609 Myositis, unspecified: Secondary | ICD-10-CM

## 2023-11-25 DIAGNOSIS — I700 Atherosclerosis of aorta: Secondary | ICD-10-CM

## 2023-11-27 ENCOUNTER — Ambulatory Visit: Attending: Rheumatology | Admitting: Rheumatology

## 2023-11-27 ENCOUNTER — Encounter: Payer: Self-pay | Admitting: Rheumatology

## 2023-11-27 ENCOUNTER — Ambulatory Visit (INDEPENDENT_AMBULATORY_CARE_PROVIDER_SITE_OTHER): Payer: Medicare Other

## 2023-11-27 VITALS — BP 108/71 | HR 75 | Resp 16 | Ht 66.5 in | Wt 208.8 lb

## 2023-11-27 DIAGNOSIS — M19042 Primary osteoarthritis, left hand: Secondary | ICD-10-CM | POA: Insufficient documentation

## 2023-11-27 DIAGNOSIS — Z8619 Personal history of other infectious and parasitic diseases: Secondary | ICD-10-CM

## 2023-11-27 DIAGNOSIS — M62838 Other muscle spasm: Secondary | ICD-10-CM | POA: Diagnosis present

## 2023-11-27 DIAGNOSIS — M79671 Pain in right foot: Secondary | ICD-10-CM

## 2023-11-27 DIAGNOSIS — G2581 Restless legs syndrome: Secondary | ICD-10-CM

## 2023-11-27 DIAGNOSIS — M25512 Pain in left shoulder: Secondary | ICD-10-CM | POA: Diagnosis present

## 2023-11-27 DIAGNOSIS — M7062 Trochanteric bursitis, left hip: Secondary | ICD-10-CM | POA: Diagnosis present

## 2023-11-27 DIAGNOSIS — J84112 Idiopathic pulmonary fibrosis: Secondary | ICD-10-CM | POA: Diagnosis present

## 2023-11-27 DIAGNOSIS — Z8639 Personal history of other endocrine, nutritional and metabolic disease: Secondary | ICD-10-CM | POA: Diagnosis present

## 2023-11-27 DIAGNOSIS — M7061 Trochanteric bursitis, right hip: Secondary | ICD-10-CM

## 2023-11-27 DIAGNOSIS — Z8659 Personal history of other mental and behavioral disorders: Secondary | ICD-10-CM | POA: Diagnosis present

## 2023-11-27 DIAGNOSIS — Z8669 Personal history of other diseases of the nervous system and sense organs: Secondary | ICD-10-CM | POA: Diagnosis present

## 2023-11-27 DIAGNOSIS — Z853 Personal history of malignant neoplasm of breast: Secondary | ICD-10-CM

## 2023-11-27 DIAGNOSIS — I428 Other cardiomyopathies: Secondary | ICD-10-CM | POA: Diagnosis not present

## 2023-11-27 DIAGNOSIS — Z8673 Personal history of transient ischemic attack (TIA), and cerebral infarction without residual deficits: Secondary | ICD-10-CM

## 2023-11-27 DIAGNOSIS — Z8679 Personal history of other diseases of the circulatory system: Secondary | ICD-10-CM

## 2023-11-27 DIAGNOSIS — I671 Cerebral aneurysm, nonruptured: Secondary | ICD-10-CM

## 2023-11-27 DIAGNOSIS — Z8719 Personal history of other diseases of the digestive system: Secondary | ICD-10-CM

## 2023-11-27 DIAGNOSIS — M797 Fibromyalgia: Secondary | ICD-10-CM | POA: Diagnosis present

## 2023-11-27 DIAGNOSIS — Z96653 Presence of artificial knee joint, bilateral: Secondary | ICD-10-CM

## 2023-11-27 DIAGNOSIS — M5030 Other cervical disc degeneration, unspecified cervical region: Secondary | ICD-10-CM | POA: Diagnosis present

## 2023-11-27 DIAGNOSIS — M19041 Primary osteoarthritis, right hand: Secondary | ICD-10-CM | POA: Diagnosis present

## 2023-11-27 DIAGNOSIS — M722 Plantar fascial fibromatosis: Secondary | ICD-10-CM

## 2023-11-27 DIAGNOSIS — G8929 Other chronic pain: Secondary | ICD-10-CM | POA: Diagnosis present

## 2023-11-28 ENCOUNTER — Ambulatory Visit: Admitting: Podiatry

## 2023-11-28 ENCOUNTER — Ambulatory Visit (INDEPENDENT_AMBULATORY_CARE_PROVIDER_SITE_OTHER): Admitting: Podiatry

## 2023-11-28 ENCOUNTER — Ambulatory Visit
Admission: RE | Admit: 2023-11-28 | Discharge: 2023-11-28 | Disposition: A | Discharge status: Home / Self Care | Source: Ambulatory Visit | Attending: Podiatry | Admitting: Podiatry

## 2023-11-28 ENCOUNTER — Other Ambulatory Visit: Payer: Self-pay | Admitting: Internal Medicine

## 2023-11-28 ENCOUNTER — Encounter: Payer: Self-pay | Admitting: Podiatry

## 2023-11-28 DIAGNOSIS — M869 Osteomyelitis, unspecified: Secondary | ICD-10-CM

## 2023-11-28 DIAGNOSIS — M109 Gout, unspecified: Secondary | ICD-10-CM | POA: Diagnosis not present

## 2023-11-28 LAB — CUP PACEART REMOTE DEVICE CHECK
Battery Remaining Longevity: 5 mo
Battery Remaining Percentage: 5 %
Brady Statistic RV Percent Paced: 0 %
Date Time Interrogation Session: 20250409174200
HighPow Impedance: 78 Ohm
Implantable Lead Connection Status: 753985
Implantable Lead Implant Date: 20050603
Implantable Lead Location: 753860
Implantable Lead Model: 185
Implantable Lead Serial Number: 116340
Implantable Pulse Generator Implant Date: 20110505
Lead Channel Impedance Value: 636 Ohm
Lead Channel Pacing Threshold Amplitude: 0.8 V
Lead Channel Pacing Threshold Pulse Width: 0.4 ms
Lead Channel Setting Pacing Amplitude: 2.4 V
Lead Channel Setting Pacing Pulse Width: 0.4 ms
Lead Channel Setting Sensing Sensitivity: 0.4 mV
Pulse Gen Serial Number: 266301
Zone Setting Status: 755011

## 2023-11-28 MED ORDER — IOPAMIDOL (ISOVUE-300) INJECTION 61%
75.0000 mL | Freq: Once | INTRAVENOUS | Status: AC | PRN
  Administered 2023-11-28: 75 mL via INTRAVENOUS

## 2023-11-28 NOTE — Progress Notes (Signed)
 She presents today for follow-up of her third toe right foot states that it feels some better she has been working in the [employer] she is taking her doxycycline but was unable to get the colchicine.  She states that her rheumatologist went over the blood work results with her.  Rheumatology did not inject her hand not knowing whether or not she was have to have something done to her foot.  Objective: Vital signs are stable alert oriented x 3 pulses are palpable toes appears to be less swollen than it was last time but is still markedly tender just proximal to the nail in the middle phalanx.  I reviewed the blood work does not demonstrate any type of infectious or rheumatic issues.  She is scheduled for CT today.  Assessment: Cystic bone lesion in the middle phalanx third digit right foot.  Plan: Await CT findings.

## 2023-11-29 ENCOUNTER — Other Ambulatory Visit: Payer: Self-pay | Admitting: Adult Health

## 2023-12-06 ENCOUNTER — Encounter: Payer: Self-pay | Admitting: Podiatry

## 2023-12-07 ENCOUNTER — Encounter: Payer: Self-pay | Admitting: Internal Medicine

## 2023-12-10 ENCOUNTER — Ambulatory Visit (INDEPENDENT_AMBULATORY_CARE_PROVIDER_SITE_OTHER)

## 2023-12-10 ENCOUNTER — Other Ambulatory Visit: Payer: Self-pay | Admitting: Podiatry

## 2023-12-10 ENCOUNTER — Ambulatory Visit (INDEPENDENT_AMBULATORY_CARE_PROVIDER_SITE_OTHER): Admitting: Podiatry

## 2023-12-10 DIAGNOSIS — M7752 Other enthesopathy of left foot: Secondary | ICD-10-CM

## 2023-12-10 DIAGNOSIS — M7751 Other enthesopathy of right foot: Secondary | ICD-10-CM

## 2023-12-10 MED ORDER — METHYLPREDNISOLONE 4 MG PO TBPK: ORAL_TABLET | ORAL | 0 refills | Status: DC

## 2023-12-11 NOTE — Progress Notes (Signed)
 She presents today for follow-up of her third toe right foot states that her toe may be doing a little bit better has not been on the antibiotics lately.  She is eager to discuss her CT scan.  She is also concerned about the third toe on the left foot which also had surgery starting to look dark on the end.  Objective: Vital signs are stable alert and oriented x 3 pulses are palpable.  Right foot third toe does demonstrate much decrease in its symptomatology it is still hyperpigmented moderately edematous but less tender than it was previously and does not demonstrate excessive warmth.  CT of the toe does demonstrate what appears to be cystic deformity that could be associated with previous surgery cannot rule out osteomyelitic process but most favorably demonstrates osteoarthritic changes.  Third toe left foot demonstrates some hyperpigmentation of the distal aspect of the toe capillary fill time is immediate and the toe is nontender.  Radiographs of the toe taken today demonstrate an osseously mature individual with a good fusion site of the toe.  Assessment: Currently we will treat this as an inflammatory arthritic process.  Plan: Started her on methylprednisolone  x 6 days, follow-up with her in 2 weeks.  Based on all of her blood work and scans does not appear to be uric acid process or infectious process.  However should this become more severely inflamed with steroids then I would lean more toward infection.  We did briefly discuss possible disarticulation.

## 2023-12-16 NOTE — Progress Notes (Signed)
 Office Visit Note  Patient: [name]             Date of Birth: 1951-08-04           MRN: [id number]             PCP: Elwyn Hamper, PA-C Referring: Elwyn Hamper, PA-C Visit Date: 12/19/2023 Occupation: @GUAROCC @  Subjective:  Right Foot pain   History of Present Illness: [name] is a 73 y.o. female with osteoarthritis, degenerative disease and fibromyalgia syndrome.  She returns today after her last visit in April 2025.  She states she continues to have pain and discomfort in her right third toe.  She has seen Dr. Clearence Curet who is concerned about osteomyelitis and discussed amputation of her toe.  She finished antibiotics couple of weeks ago.  Patient wants to have a second opinion.  She continues to have some discomfort in her right thumb.  She continues to have lower back pain.  She denies any increased shortness of breath and has been followed by Dr. Marcheta Seta.  She continues to be on Esbriet .    Activities of Daily Living:  Patient reports morning stiffness for 15 minutes.   Patient Reports nocturnal pain.  Difficulty dressing/grooming: Denies Difficulty climbing stairs: Reports Difficulty getting out of chair: Reports Difficulty using hands for taps, buttons, cutlery, and/or writing: Denies  Review of Systems  Constitutional:  Positive for fatigue.  HENT:  Negative for mouth sores and mouth dryness.   Eyes:  Positive for dryness.  Respiratory:  Positive for shortness of breath.   Cardiovascular:  Positive for palpitations. Negative for chest pain.  Gastrointestinal:  Positive for constipation. Negative for blood in stool and diarrhea.  Endocrine: Negative for increased urination.  Genitourinary:  Positive for involuntary urination.  Musculoskeletal:  Positive for joint pain, gait problem, joint pain, myalgias, muscle weakness, morning stiffness and myalgias. Negative for joint swelling and muscle tenderness.  Skin:  Positive for hair loss and  sensitivity to sunlight. Negative for color change and rash.  Allergic/Immunologic: Negative for susceptible to infections.  Neurological:  Positive for dizziness and headaches.  Hematological:  Negative for swollen glands.  Psychiatric/Behavioral:  Positive for sleep disturbance. Negative for depressed mood. The patient is nervous/anxious.     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   Localized swelling of right lower leg 08/29/2021   Tonsillar exudate 08/29/2021   Candida vaginitis 08/28/2021   Chronic pain syndrome 08/25/2021   Multifocal pneumonia 06/03/2021   Pre-operative respiratory examination 05/18/2021   Statin myopathy 04/19/2021   Pain in joint of right shoulder 02/10/2021   Arthritis of both acromioclavicular joints 01/02/2021   Trochanteric bursitis of right hip 10/03/2020   Tailbone injury 05/05/2020   UTI (urinary tract infection) 05/05/2020   Insomnia 05/05/2020   Leg cramps 02/16/2020   Dysuria 02/16/2020   Right hip impingement syndrome 02/01/2020   Right bicipital tenosynovitis 02/01/2020   Bronchiectasis without complication (HCC)  01/22/2020   Myofascial pain 12/21/2019   Estrogen deficiency 12/10/2019   Obesity (BMI 30-39.9) 12/10/2019   Bronchiectasis with (acute) exacerbation (HCC) 10/23/2019   Elevated LFTs 10/15/2019   Lower abdominal pain 09/01/2019   Neck pain 10/25/2017    Paresthesias in left hand 10/25/2017   Paresthesia of foot, bilateral 04/12/2017   Primary osteoarthritis of both hands 09/27/2016   Primary osteoarthritis of both knees 09/27/2016   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   Nasopharyngitis, 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/2016   S/P total knee replacement 02/13/2016   Chronic pain of both knees 12/06/2015   Globus pharyngeus 05/17/2015   Genetic testing 12/23/2014   Family history of breast cancer    Family history of colon cancer    Family history of pancreatic cancer    History of breast cancer 11/23/2014   Allergy to ACE inhibitors 09/27/2014   Prediabetes 06/23/2013   Muscle spasms of both lower extremities 01/15/2013   Hx of Clostridium difficile infection 10/10/2012   Dyspnea on exertion 04/02/2012   NICM (nonischemic cardiomyopathy) (HCC) 03/07/2012   Post-menopausal 01/11/2012   Atypical chest pain 11/07/2011   Obstructive sleep apnea 04/04/2010   V-tach (HCC) 01/03/2009   ICD  [employer]  Single chamber 01/03/2009   PFO (patent foramen ovale) 09/29/2008   Vitamin D  deficiency 08/17/2008   Hypothyroidism 11/18/2006   Hyperlipidemia 11/18/2006   Depression with anxiety 11/18/2006   Essential hypertension 11/18/2006   Mitral valve prolapse 11/18/2006   Cerebral aneurysm 11/18/2006   Allergic rhinitis 11/18/2006   GERD 11/18/2006   Diverticulosis of colon 11/18/2006   Fatty liver 11/18/2006   Fibromyalgia 11/18/2006    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   Bronchiectasis (HCC)    Cerebral aneurysm, nonruptured    had a clip put in   CHF (congestive heart failure) (HCC)    Clostridium difficile infection    Depressive disorder, not elsewhere classified    Diverticulosis of colon (without mention of hemorrhage)    Esophageal reflux    Fatty  liver    Fibromyalgia    Gastritis    GERD (gastroesophageal reflux disease)    GI bleed 2004   Glaucoma    Hiatal hernia    History of COVID-19 05/04/2021   Hyperlipidemia    Hypertension    Hypothyroidism    Internal hemorrhoids    Joint pain    Multifocal pneumonia 06/03/2021   Obstructive sleep apnea (adult) (pediatric)    Osteoarthritis    Ostium secundum type atrial septal defect    Other chronic nonalcoholic liver disease    Other pulmonary embolism and infarction    Palpitations    Paroxysmal ventricular tachycardia (HCC)    Personal history of radiation therapy    PONV (postoperative nausea and vomiting)    Presence of permanent cardiac pacemaker    PUD (peptic ulcer disease)    Radiation 02/03/15-03/10/15   Right Breast   Sarcoid    per pt , not sure   Schatzki's ring    Shortness of breath    Sleep apnea    Stroke [facility]) 2013   tia/ pt feels it was around 2008 0r 2009   Takotsubo syndrome    Tubular adenoma of colon    Unspecified transient cerebral ischemia  Unspecified vitamin D  deficiency     Family History  Problem Relation Age of Onset   Diabetes Mother    Alzheimer's disease Mother    Hypertension Mother    Obesity Mother    Arthritis Mother    Heart disease Father    Seizures Sister    Allergies Sister    Heart disease Sister    Early death Sister    Other Brother        Thyroid  problem 10/2016   Hearing loss Brother    Vision loss Brother    Heart disease Brother    Prostate cancer Brother 68       same brother as throat cancer   Throat cancer Brother        dx in his 21s; also a smoker   Arthritis Brother    Hypertension Brother    Cancer Maternal Grandmother 53       colon cancer or abdominal cancer   Lung cancer Maternal Grandfather 4   Heart disease Son        Cardiac Arrest 07/2016   Breast cancer Maternal Aunt 55   Colon cancer Maternal Aunt 32       same sister as breast at 26   Breast cancer Maternal Aunt        dx in  her 38s   Lung cancer Maternal Uncle    Pancreatic cancer Cousin 79       maternal first cousin   Breast cancer Cousin        paternal first cousin twice removed died in her 96s   Anxiety disorder Sister    Arthritis Sister    Depression Sister    Hypertension Sister    Stroke Neg Hx    Past Surgical History:  Procedure Laterality Date   ABDOMINAL HYSTERECTOMY  11/22/1998   ABI  2006   normal   BREAST EXCISIONAL BIOPSY     BREAST LUMPECTOMY Right 2016   BREAST LUMPECTOMY WITH RADIOACTIVE SEED LOCALIZATION Right 01/03/2015   Procedure: BREAST LUMPECTOMY WITH RADIOACTIVE SEED LOCALIZATION;  Surgeon: Lillette Reid III, MD;  Location: [facility] OR;  Service: General;  Laterality: Right;   CARDIAC DEFIBRILLATOR PLACEMENT  2006; 2012   BSX single chamber ICD   carotid dopplers  2006   neg   CEREBRAL ANEURYSM REPAIR  02/1999   COLONOSCOPY     EYE SURGERY     HAMMER TOE SURGERY  11/15/2020   3rd digit bilateral feet    hospitalization  2004   GI bleed, PUD, diverticulosis (EGD,colonscopy)   hospitalization     PE, NSVT, s/p defib   JOINT REPLACEMENT  05/2021   KNEE ARTHROSCOPY     bilateral   LEFT HEART CATHETERIZATION WITH CORONARY ANGIOGRAM N/A 02/22/2012   Procedure: LEFT HEART CATHETERIZATION WITH CORONARY ANGIOGRAM;  Surgeon: Odie Benne, MD;  Location: [facility] CATH LAB;  Service: Cardiovascular;  Laterality: N/A;   PARTIAL HYSTERECTOMY     Fibroids   TONGUE BIOPSY  12/12/2017   due to sore tongue and white patches/abnormal cells   TOTAL KNEE ARTHROPLASTY Left 02/13/2016   Procedure: TOTAL KNEE ARTHROPLASTY;  Surgeon: Christie Cox, MD;  Location: [facility] OR;  Service: Orthopedics;  Laterality: Left;   TOTAL KNEE ARTHROPLASTY Right 05/22/2021   Procedure: TOTAL KNEE ARTHROPLASTY;  Surgeon: Christie Cox, MD;  Location: [facility] ORS;  Service: Orthopedics;  Laterality: Right;  with block   TUBAL LIGATION  09/20/1978   UPPER GASTROINTESTINAL ENDOSCOPY  Social History   Social History  Narrative   Lives alone   Caffeine UJW:[id number]   Retired from [employer]   2 children -- 5 grandbabies - 1 great-grandbaby    Immunization History  Administered Date(s) Administered   Fluad Quad(high Dose 65+) 04/17/2019, 04/30/2020   Fluad Trivalent(High Dose 65+) 05/23/2023   H1N1 07/24/2008   Influenza Split 07/17/2011, 09/27/2011, 04/29/2012   Influenza Whole 07/20/2004, 07/04/2007, 05/07/2008, 05/19/2009, 05/17/2010   Influenza, High Dose Seasonal PF 05/30/2017, 05/21/2018   Influenza,inj,Quad PF,6+ Mos 05/20/2013, 05/06/2014, 05/17/2015, 05/15/2016, 04/27/2021   Influenza-Unspecified 05/07/2018   PFIZER(Purple Top)SARS-COV-2 Vaccination 09/25/2019, 10/16/2019, 06/04/2020, 01/04/2021, 08/06/2021   Pfizer Covid-19 Vaccine Bivalent Booster 41yrs & up 05/07/2022   Pneumococcal Conjugate-13 11/22/2015, 08/06/2021   Pneumococcal Polysaccharide-23 05/20/2002, 05/07/2008, 06/25/2014   Respiratory Syncytial Virus Vaccine ,Recomb Aduvanted(Arexvy ) 06/26/2022   Td 09/07/2009   Zoster, Live 09/21/2011     Objective: Vital Signs: BP 121/69 (BP Location: Left Arm, Patient Position: Sitting, Cuff Size: Normal)   Pulse 77   Resp 17   Ht 5' 6.5" (1.689 m)   Wt 212 lb (96.2 kg)   LMP  (LMP Unknown)   BMI 33.71 kg/m    Physical Exam Vitals and nursing note reviewed.  Constitutional:      Appearance: She is well-developed.  HENT:     Head: Normocephalic and atraumatic.  Eyes:     Conjunctiva/sclera: Conjunctivae normal.  Cardiovascular:     Rate and Rhythm: Normal rate and regular rhythm.     Heart sounds: Normal heart sounds.  Pulmonary:     Effort: Pulmonary effort is normal.     Breath sounds: Normal breath sounds.  Abdominal:     General: Bowel sounds are normal.     Palpations: Abdomen is soft.  Musculoskeletal:     Cervical back: Normal range of motion.  Lymphadenopathy:     Cervical: No cervical adenopathy.  Skin:    General: Skin is warm and dry.     Capillary Refill:  Capillary refill takes less than 2 seconds.  Neurological:     Mental Status: She is alert and oriented to person, place, and time.  Psychiatric:        Behavior: Behavior normal.      Musculoskeletal Exam: She had limited lateral rotation of the cervical spine.  She had discomfort in the lower lumbar region.  Shoulders, elbows and wrist joints with good range of motion.  She had CMC, PIP and DIP thickening.  She is some discomfort over her right CMC joint.  Hip joints and knee joints with good range of motion.  Bilateral knee joints were replaced and had painful range of motion.  She had tenderness and swelling of the right third toe distal phalanx.  Hyperpigmentation of the right third toe was noted.  CDAI Exam: CDAI Score: -- Patient Global: --; Provider Global: -- Swollen: --; Tender: -- Joint Exam 12/19/2023   No joint exam has been documented for this visit   There is currently no information documented on the homunculus. Go to the Rheumatology activity and complete the homunculus joint exam.  Investigation: No additional findings.  Imaging: DG Foot Complete Left Result Date: 12/10/2023 Please see detailed radiograph report in office note.  CT FOOT RIGHT W CONTRAST Result Date: 12/10/2023 CLINICAL DATA:  Right 3rd toe lytic lesion on radiographs. Possible infection, gout or osteomyelitis. Brain aneurysm clip precluding MRI. EXAM: CT OF THE LOWER RIGHT EXTREMITY WITH CONTRAST TECHNIQUE: Multidetector CT imaging of the right foot  was performed according to the standard protocol following intravenous contrast administration. RADIATION DOSE REDUCTION: This exam was performed according to the departmental dose-optimization program which includes automated exposure control, adjustment of the mA and/or kV according to patient size and/or use of iterative reconstruction technique. CONTRAST:  75mL ISOVUE -300 IOPAMIDOL  (ISOVUE -300) INJECTION 61% COMPARISON:  Right foot radiographs 11/21/2023,  12/28/2020 and 02/15/2009 FINDINGS: Bones/Joint/Cartilage As seen on the recent radiographs, there is a well-circumscribed lucent lesion within the middle and distal phalanges of the 3rd toe which appear ankylosed. This lucent lesion appears slightly expansile, measuring approximately 8 x 7 x 6 mm. There is cortical breakthrough inferiorly without associated soft tissue mass. Subjectively, this demonstrates internal low density. This lesion appears new from 12/28/2020 radiographs, although there was a possible fracture or osteotomy defect at this site on that study. No other focal lytic lesions identified. The bones are diffusely demineralized there are mild degenerative changes at the 1st metatarsal-phalangeal joint and within the midfoot. Underlying fibrocartilaginous calcaneonavicular coalition noted. Ligaments Suboptimally assessed by CT. Muscles and Tendons As evaluated by CT, the ankle tendons appear intact without significant tenosynovitis. No focal muscular abnormalities are identified within the right foot. Soft tissues Probable incidental pressure lesion plantar to the base of the 5th metatarsal and 1st metatarsal head. There appears to be some soft tissue swelling in the 3rd toe. No obvious skin ulceration, focal fluid collection, soft tissue emphysema or foreign body identified. IMPRESSION: 1. Well-circumscribed lucent lesion within the middle and distal phalanges of the 3rd toe which appear ankylosed. This lesion appears new from 12/28/2020 radiographs, although there was a possible fracture or osteotomy defect at this site on that study. This lesion is nonspecific; differential includes posttraumatic cyst, infection and neoplasm. Appearance is atypical for gout. Correlate clinically. 2. No other focal lytic lesions identified. 3. Diffuse osseous demineralization and mild degenerative changes. 4. Fibrocartilaginous calcaneonavicular coalition. Electronically Signed   By: Elmon Hagedorn M.D.   On:  12/10/2023 09:39   CUP PACEART REMOTE DEVICE CHECK Result Date: 11/28/2023 Scheduled remote reviewed. Normal device function.  Presenting rhythm: VS at 91 bpm Device estimates 5 months until ERI, start IFU, sent to triage 12 NSVT, device indicated. Only 2 viewable EGMs, fastest 165 bpm Next remote 91 days. ML, CVRS  DG Foot Complete Right Result Date: 11/21/2023 Please see detailed radiograph report in office note.   Recent Labs: Lab Results  Component Value Date   WBC 6.3 11/21/2023   HGB 14.9 11/21/2023   PLT 229 11/21/2023   NA 139 09/07/2023   K 4.0 09/07/2023   CL 108 09/07/2023   CO2 24 09/07/2023   GLUCOSE 113 (H) 09/07/2023   BUN 14 09/07/2023   CREATININE 0.81 09/07/2023   BILITOT 0.4 10/28/2023   ALKPHOS 173 (H) 10/28/2023   AST 43 (H) 10/28/2023   ALT 42 (H) 10/28/2023   PROT 7.6 10/28/2023   ALBUMIN 4.4 10/28/2023   CALCIUM 9.6 09/07/2023   GFRAA 82 10/01/2019   QFTBGOLDPLUS NEGATIVE 08/24/2022    Speciality Comments: Narc Agreement: 01/16/18  Procedures:  No procedures performed Allergies: Ace inhibitors, Amitriptyline hcl, Atorvastatin, Dilantin [phenytoin sodium extended], Duloxetine  hcl, Paroxetine, Ramipril , Rosuvastatin, Carbamazepine, Codeine , Phenytoin sodium extended, Simvastatin , and Wellbutrin  [bupropion ]   Assessment / Plan:     Visit Diagnoses: Primary osteoarthritis of both hands-she continues to have pain and discomfort in the bilateral hands.  The pain is mostly over the right [facility] joint.  I discussed use of right CMC brace but she declined.  Joint protection was discussed.  Chronic left shoulder pain-she has intermittent discomfort in her left shoulder joint.  She had good range of motion.  Hx of total knee replacement, bilateral - Right total knee replacement May 22, 2021 by Dr. Macky Sayres.  She continues to have discomfort in her both knees.  Trochanteric bursitis, left hip-she has not had discomfort in the left trochanteric region.  She did not  have much tenderness today.  Pain in right foot - she complains of pain and discomfort in her right third toe.  She has been under care of Dr. Lara Plants.  Patient had recent CT scan.  The results were reviewed with the patient.  Patient states Dr. Clearence Curet discussed amputation of the toe.  She wants second opinion.  She wants to be referred to an orthopedic surgeon.  Per patient's request I will place a referral.  DDD (degenerative disc disease), cervical-she continues to have some discomfort and limited range of motion of the cervical spine.  IPF (idiopathic pulmonary fibrosis) (HCC) - diagnosed with IPF by Dr. Bertrum Brodie.  She has been on pirfenidone .  She continues to have some crackles in her lungs and some shortness of breath.  Fibromyalgia - She has been followed by Dr. Lovorn.  She is on tramadol  and methocarbamol  by pain management.  Trapezius muscle spasm-she continues to have some trapezius spasm.  Range of motion exercises were discussed.  Other medical problems are listed as follows:  History of hypertension  History of TIA (transient ischemic attack)  History of hypercholesterolemia  Brain aneurysm  Hx of Clostridium difficile infection  History of breast cancer  History of diverticulosis  History of depression  History of hypothyroidism  RLS (restless legs syndrome)  History of sleep apnea  History of migraine  Orders: Orders Placed This Encounter  Procedures   Ambulatory referral to Orthopedic Surgery   No orders of the defined types were placed in this encounter.    Follow-Up Instructions: Return for Osteoarthritis.   Nicholas Bari, MD  Note - This record has been created using Animal nutritionist.  Chart creation errors have been sought, but may not always  have been located. Such creation errors do not reflect on  the standard of medical care.

## 2023-12-17 ENCOUNTER — Other Ambulatory Visit: Payer: Self-pay | Admitting: Physical Medicine and Rehabilitation

## 2023-12-17 ENCOUNTER — Other Ambulatory Visit (HOSPITAL_COMMUNITY): Payer: Self-pay

## 2023-12-17 ENCOUNTER — Other Ambulatory Visit: Payer: Self-pay | Admitting: Pharmacy Technician

## 2023-12-17 NOTE — Progress Notes (Signed)
 Specialty Pharmacy Refill Coordination Note  [name] is a 73 y.o. female contacted today regarding refills of specialty medication(s) Pirfenidone    Patient requested Delivery   Delivery date: 12/19/23   Verified address: [address] [place], [place], [place] [address]   Medication will be filled on 12/18/23.

## 2023-12-18 ENCOUNTER — Other Ambulatory Visit: Payer: Self-pay

## 2023-12-18 ENCOUNTER — Telehealth: Payer: Self-pay

## 2023-12-18 NOTE — Progress Notes (Signed)
 Per automated response on CMM: Authorization already on file for this request. Authorization starting on 05/21/2023 and ending on 08/19/2024.

## 2023-12-18 NOTE — Progress Notes (Addendum)
 Refill too soon. Spoke with patient, and she is OK with receiving Friday. Rejection also states PA required. Routed to RX RHEUM/PULM.

## 2023-12-18 NOTE — Telephone Encounter (Signed)
 Submitted an URGENT Prior Authorization renewal request to [employer] for PIRFENIDONE  via CoverMyMeds. Will update once we receive a response.  Key: [id number]   Per automated response: Authorization already on file for this request. Authorization starting on 05/21/2023 and ending on 08/19/2024.

## 2023-12-19 ENCOUNTER — Other Ambulatory Visit: Payer: Self-pay

## 2023-12-19 ENCOUNTER — Ambulatory Visit: Attending: Rheumatology | Admitting: Rheumatology

## 2023-12-19 ENCOUNTER — Encounter: Payer: Self-pay | Admitting: Rheumatology

## 2023-12-19 VITALS — BP 121/69 | HR 77 | Resp 17 | Ht 66.5 in | Wt 212.0 lb

## 2023-12-19 DIAGNOSIS — Z96653 Presence of artificial knee joint, bilateral: Secondary | ICD-10-CM

## 2023-12-19 DIAGNOSIS — M25512 Pain in left shoulder: Secondary | ICD-10-CM | POA: Diagnosis not present

## 2023-12-19 DIAGNOSIS — Z8659 Personal history of other mental and behavioral disorders: Secondary | ICD-10-CM | POA: Diagnosis present

## 2023-12-19 DIAGNOSIS — M79671 Pain in right foot: Secondary | ICD-10-CM | POA: Diagnosis present

## 2023-12-19 DIAGNOSIS — Z8673 Personal history of transient ischemic attack (TIA), and cerebral infarction without residual deficits: Secondary | ICD-10-CM

## 2023-12-19 DIAGNOSIS — J84112 Idiopathic pulmonary fibrosis: Secondary | ICD-10-CM

## 2023-12-19 DIAGNOSIS — Z8719 Personal history of other diseases of the digestive system: Secondary | ICD-10-CM | POA: Diagnosis present

## 2023-12-19 DIAGNOSIS — M7062 Trochanteric bursitis, left hip: Secondary | ICD-10-CM | POA: Diagnosis present

## 2023-12-19 DIAGNOSIS — Z8639 Personal history of other endocrine, nutritional and metabolic disease: Secondary | ICD-10-CM | POA: Diagnosis present

## 2023-12-19 DIAGNOSIS — M19042 Primary osteoarthritis, left hand: Secondary | ICD-10-CM

## 2023-12-19 DIAGNOSIS — Z8619 Personal history of other infectious and parasitic diseases: Secondary | ICD-10-CM

## 2023-12-19 DIAGNOSIS — M5030 Other cervical disc degeneration, unspecified cervical region: Secondary | ICD-10-CM | POA: Diagnosis present

## 2023-12-19 DIAGNOSIS — G8929 Other chronic pain: Secondary | ICD-10-CM

## 2023-12-19 DIAGNOSIS — M797 Fibromyalgia: Secondary | ICD-10-CM | POA: Diagnosis present

## 2023-12-19 DIAGNOSIS — Z8669 Personal history of other diseases of the nervous system and sense organs: Secondary | ICD-10-CM

## 2023-12-19 DIAGNOSIS — Z8679 Personal history of other diseases of the circulatory system: Secondary | ICD-10-CM

## 2023-12-19 DIAGNOSIS — G2581 Restless legs syndrome: Secondary | ICD-10-CM

## 2023-12-19 DIAGNOSIS — M62838 Other muscle spasm: Secondary | ICD-10-CM

## 2023-12-19 DIAGNOSIS — Z853 Personal history of malignant neoplasm of breast: Secondary | ICD-10-CM | POA: Diagnosis present

## 2023-12-19 DIAGNOSIS — I671 Cerebral aneurysm, nonruptured: Secondary | ICD-10-CM | POA: Diagnosis present

## 2023-12-19 DIAGNOSIS — M19041 Primary osteoarthritis, right hand: Secondary | ICD-10-CM | POA: Diagnosis not present

## 2023-12-23 ENCOUNTER — Other Ambulatory Visit: Payer: Self-pay | Admitting: Internal Medicine

## 2023-12-23 ENCOUNTER — Other Ambulatory Visit: Payer: Self-pay | Admitting: Family Medicine

## 2023-12-23 DIAGNOSIS — E038 Other specified hypothyroidism: Secondary | ICD-10-CM

## 2023-12-25 ENCOUNTER — Encounter: Payer: Self-pay | Admitting: Rheumatology

## 2023-12-26 ENCOUNTER — Encounter: Payer: Self-pay | Admitting: Internal Medicine

## 2023-12-26 ENCOUNTER — Other Ambulatory Visit: Payer: Self-pay | Admitting: Physical Medicine and Rehabilitation

## 2023-12-26 ENCOUNTER — Other Ambulatory Visit: Payer: Self-pay | Admitting: Adult Health

## 2023-12-26 NOTE — Telephone Encounter (Signed)
 I called and spoke with the pt  She states that she has not checked sats but will do this and let us  know if dropping below 90% ra  She is feeling dizzy even resting, so she thinks it's not o2 issue  Will hold esbriet  x 1 wk and then give update  Will call sooner if needed  Routing back to MR as FYI

## 2023-12-26 NOTE — Telephone Encounter (Signed)
 Dizziness  - possible it is due to pifenidone or could be hypxoemia  Plan - has she walked and see her o2 level - stop esbeir x 1 weeks and then restart

## 2023-12-30 ENCOUNTER — Encounter (HOSPITAL_BASED_OUTPATIENT_CLINIC_OR_DEPARTMENT_OTHER): Payer: Self-pay | Admitting: Family

## 2023-12-30 ENCOUNTER — Encounter: Payer: Self-pay | Admitting: Podiatry

## 2023-12-30 ENCOUNTER — Ambulatory Visit (INDEPENDENT_AMBULATORY_CARE_PROVIDER_SITE_OTHER): Payer: Medicare Other | Admitting: Family

## 2023-12-30 ENCOUNTER — Ambulatory Visit

## 2023-12-30 ENCOUNTER — Encounter: Payer: Self-pay | Admitting: Internal Medicine

## 2023-12-30 VITALS — BP 132/64 | HR 81 | Ht 66.5 in | Wt 214.0 lb

## 2023-12-30 DIAGNOSIS — I428 Other cardiomyopathies: Secondary | ICD-10-CM

## 2023-12-30 DIAGNOSIS — E782 Mixed hyperlipidemia: Secondary | ICD-10-CM | POA: Diagnosis not present

## 2023-12-30 DIAGNOSIS — I10 Essential (primary) hypertension: Secondary | ICD-10-CM | POA: Diagnosis not present

## 2023-12-30 DIAGNOSIS — I700 Atherosclerosis of aorta: Secondary | ICD-10-CM

## 2023-12-30 DIAGNOSIS — T466X5D Adverse effect of antihyperlipidemic and antiarteriosclerotic drugs, subsequent encounter: Secondary | ICD-10-CM

## 2023-12-30 DIAGNOSIS — M609 Myositis, unspecified: Secondary | ICD-10-CM | POA: Diagnosis not present

## 2023-12-30 NOTE — Patient Instructions (Signed)
 Medication Instructions:  Continue your current medications  *If you need a refill on your cardiac medications before your next appointment, please call your pharmacy*  Lab Work: Fasting lipid panel in 2 months  If you have labs (blood work) drawn today and your tests are completely normal, you will receive your results only by: MyChart Message (if you have MyChart) OR A paper copy in the mail If you have any lab test that is abnormal or we need to change your treatment, we will call you to review the results.  Testing/Procedures: Your physician has requested that you have an echocardiogram at [facility]. Echocardiography is a painless test that uses sound waves to create images of your heart. It provides your doctor with information about the size and shape of your heart and how well your heart's chambers and valves are working. This procedure takes approximately one hour. There are no restrictions for this procedure. Please do NOT wear cologne, perfume, aftershave, or lotions (deodorant is allowed). Please arrive 15 minutes prior to your appointment time.  Please note: We ask at that you not bring children with you during ultrasound (echo/ vascular) testing. Due to room size and safety concerns, children are not allowed in the ultrasound rooms during exams. Our front office staff cannot provide observation of children in our lobby area while testing is being conducted. An adult accompanying a patient to their appointment will only be allowed in the ultrasound room at the discretion of the ultrasound technician under special circumstances. We apologize for any inconvenience.   Follow-Up: At [facility], you and your health needs are our priority.  As part of our continuing mission to provide you with exceptional heart care, our providers are all part of one team.  This team includes your primary Cardiologist (physician) and Advanced Practice Providers or APPs (Physician  Assistants and Nurse Practitioners) who all work together to provide you with the care you need, when you need it.  Your next appointment:   2-3 month(s)  Provider:   As scheduled with Electrophysiology Michaelle Adolphus, NP AND to establish at [facility] street Dr. Alda Amas, Dr. Paulita Boss, Dr. Renna Cary or Advanced Practice Provider  We recommend signing up for the patient portal called "MyChart".  Sign up information is provided on this After Visit Summary.  MyChart is used to connect with patients for Virtual Visits (Telemedicine).  Patients are able to view lab/test results, encounter notes, upcoming appointments, etc.  Non-urgent messages can be sent to your provider as well.   To learn more about what you can do with MyChart, go to [email or web address].   Other Instructions

## 2023-12-30 NOTE — Progress Notes (Signed)
 Cardiology Office Note:  .   Date:  12/31/2023  ID:  [name], DOB [birth year], MRN [id number] PCP: Sharalyn Dasen  [facility] Providers Cardiologist:  Avery Bodo, MD Electrophysiologist:  Richardo Chandler, MD    History of Present Illness: .   [name] is a 73 y.o. female with hx of NICM (2013 LVEF 35-40% ? 2022 LVEF 50 to 55% ? 03/2023 LVEF 40-45% in setting of PE), ICD implanted 12/22/09, ophthalmic artery repair, OSA on CPAP, fibromyalgias, hypothyroidism, anxiety, VT, PVC.   Prior LHC 2006 with no coronary artery disease. ZIO 11/2019 due to palpitations with predominantly normal sinus rhythm, palpitations mostly attributed with infrequent PVC, 6 nonsustained VT episodes longest 7 beats.  Myoview  03/2020 low risk study with normal perfusion.  Admitted 06/03/21 with shortness of breath treated for possible PNA/chronic ILD, respiratory bronchiolitis.   In 2023 due to hair loss beta blocker transitioned ot Diltiazem .   Admission 03/2023 with finding of PE. Echo at that time LVEF 40-45%, LV global hypokinesis, gr1dd, RV normal mild LAE, trivial MR.   Last seen by Dr. Jacquelynn Matter 04/15/23 with some improvement in breathing a few weeks after anticoagulation started. No changes made and recommended to follow up in 6 months.   Presents today for follow up independently. Reports no shortness of breath nor dyspnea on exertion. Reports no chest pain, pressure, or tightness. No  orthopnea, PND. Reports no palpitations.  Wearing CPAP regularly. Checking BP intermittently with wrist cuff at home and reports readings have been well controlled. Does note due to lightheadedness her pirfenidone  was held  per Dr. Bertrum Brodie. Discussed elevated BP today. Notes eating out yesterday and having a [place] chicken sandwich today. Taking fluid pill (Lasix ) less than once per week when sher ankles swell. Notes she had yeast infection 08/2023 requiring Diflucan . Since that time some mild  itching, encouraged to discuss with PCP. Reviewed Jardiance  possible side effects of yeast infection, given isolated incidence will reassess LVEF prior to adjusting medications.   ROS: Please see the history of present illness.    All other systems reviewed and are negative.   Studies Reviewed: .        Cardiac Studies & Procedures   ______________________________________________________________________________________________   STRESS TESTS  MYOCARDIAL PERFUSION IMAGING 04/13/2020  Narrative  The left ventricular ejection fraction is mildly decreased (45-54%).  Nuclear stress EF: 50%.  There was no ST segment deviation noted during stress.  The perfusion study is normal.  This is a low risk study.   ECHOCARDIOGRAM  ECHOCARDIOGRAM COMPLETE 04/03/2023  Narrative ECHOCARDIOGRAM REPORT    Patient Name:   [name] Date of Exam: 04/03/2023 Medical Rec #:  [id number]         Height:       66.5 in Accession #:    [id number]        Weight:       216.0 lb Date of Birth:  Feb 19, 1951         BSA:          2.078 m Patient Age:    72 years          BP:           123/74 mmHg Patient Gender: F                 HR:           70 bpm. Exam Location:  Inpatient  Procedure: 2D Echo, Cardiac Doppler and Color  Doppler  Indications:    Pulmonary Embolus I26.09  History:        Patient has prior history of Echocardiogram examinations, most recent 08/10/2022.  Sonographer:    Daune Eric Referring Phys: [id number] MIR M [facility]   Sonographer Comments: Image acquisition challenging due to patient body habitus and Image acquisition challenging due to respiratory motion. IMPRESSIONS   1. Left ventricular ejection fraction, by estimation, is 40 to 45%. The left ventricle has mildly decreased function. The left ventricle demonstrates global hypokinesis. Left ventricular diastolic parameters are consistent with Grade I diastolic dysfunction (impaired relaxation). Elevated left  atrial pressure. 2. Right ventricular systolic function is normal. The right ventricular size is normal. There is mildly elevated pulmonary artery systolic pressure. 3. Left atrial size was mildly dilated. 4. The mitral valve is normal in structure. Trivial mitral valve regurgitation. No evidence of mitral stenosis. 5. The aortic valve is tricuspid. Aortic valve regurgitation is not visualized. No aortic stenosis is present. 6. The inferior vena cava is normal in size with greater than 50% respiratory variability, suggesting right atrial pressure of 3 mmHg.  FINDINGS Left Ventricle: Left ventricular ejection fraction, by estimation, is 40 to 45%. The left ventricle has mildly decreased function. The left ventricle demonstrates global hypokinesis. The left ventricular internal cavity size was normal in size. There is no left ventricular hypertrophy. Left ventricular diastolic parameters are consistent with Grade I diastolic dysfunction (impaired relaxation). Elevated left atrial pressure.  Right Ventricle: The right ventricular size is normal. Right ventricular systolic function is normal. There is mildly elevated pulmonary artery systolic pressure. The tricuspid regurgitant velocity is 2.96 m/s, and with an assumed right atrial pressure of 3 mmHg, the estimated right ventricular systolic pressure is 38.0 mmHg.  Left Atrium: Left atrial size was mildly dilated.  Right Atrium: Right atrial size was normal in size.  Pericardium: There is no evidence of pericardial effusion.  Mitral Valve: The mitral valve is normal in structure. Trivial mitral valve regurgitation. No evidence of mitral valve stenosis.  Tricuspid Valve: The tricuspid valve is normal in structure. Tricuspid valve regurgitation is mild . No evidence of tricuspid stenosis.  Aortic Valve: The aortic valve is tricuspid. Aortic valve regurgitation is not visualized. No aortic stenosis is present.  Pulmonic Valve: The pulmonic valve  was normal in structure. Pulmonic valve regurgitation is trivial. No evidence of pulmonic stenosis.  Aorta: The aortic root is normal in size and structure.  Venous: The inferior vena cava is normal in size with greater than 50% respiratory variability, suggesting right atrial pressure of 3 mmHg.  IAS/Shunts: No atrial level shunt detected by color flow Doppler.  Additional Comments: A device lead is visualized.   LEFT VENTRICLE PLAX 2D LVIDd:         5.10 cm      Diastology LVIDs:         3.80 cm      LV e' medial:    4.79 cm/s LV PW:         1.10 cm      LV E/e' medial:  16.2 LV IVS:        0.70 cm      LV e' lateral:   6.53 cm/s LVOT diam:     2.30 cm      LV E/e' lateral: 11.9 LV SV:         69 LV SV Index:   33 LVOT Area:     4.15 cm  LV Volumes (MOD) LV  vol d, MOD A2C: 120.0 ml LV vol d, MOD A4C: 125.0 ml LV vol s, MOD A2C: 62.9 ml LV vol s, MOD A4C: 67.4 ml LV SV MOD A2C:     57.1 ml LV SV MOD A4C:     125.0 ml LV SV MOD BP:      57.2 ml  RIGHT VENTRICLE RV S prime:     9.14 cm/s TAPSE (M-mode): 2.1 cm  LEFT ATRIUM             Index        RIGHT ATRIUM           Index LA diam:        3.40 cm 1.64 cm/m   RA Area:     15.70 cm LA Vol (A2C):   58.4 ml 28.10 ml/m  RA Volume:   36.40 ml  17.52 ml/m LA Vol (A4C):   76.6 ml 36.86 ml/m LA Biplane Vol: 71.5 ml 34.41 ml/m AORTIC VALVE             PULMONIC VALVE LVOT Vmax:   74.70 cm/s  PR End Diast Vel: 3.46 msec LVOT Vmean:  53.900 cm/s LVOT VTI:    0.165 m  AORTA Ao Root diam: 3.30 cm Ao Asc diam:  3.50 cm  MITRAL VALVE                TRICUSPID VALVE MV Area (PHT): 4.31 cm     TR Peak grad:   35.0 mmHg MV Decel Time: 176 msec     TR Vmax:        296.00 cm/s MR Peak grad: 25.6 mmHg MR Vmax:      253.00 cm/s   SHUNTS MV E velocity: 77.60 cm/s   Systemic VTI:  0.16 m MV A velocity: 115.00 cm/s  Systemic Diam: 2.30 cm MV E/A ratio:  0.67  Alexandria Angel MD Electronically signed by Alexandria Angel  MD Signature Date/Time: 04/03/2023/2:57:35 PM    Final    MONITORS  LONG TERM MONITOR (3-14 DAYS) 12/16/2019  Narrative Indication: palps  Duration: 7d  Findings Symptoms of flutters assoc mostly with PVCs Symptoms of flutters also assoc with sinus S VT Nonsustained 6 episodes; fastest 141 bpm for 6 beats; longest for 7 beats at 100 bpm   No serious arrhythmia Symptoms assoc with infrequent PVCs       ______________________________________________________________________________________________      Risk Assessment/Calculations:             Physical Exam:   VS:  BP 132/64   Pulse 81   Ht 5' 6.5" (1.689 m)   Wt 214 lb (97.1 kg)   LMP  (LMP Unknown)   SpO2 94%   BMI 34.02 kg/m     Vitals:   12/30/23 1541 12/30/23 1639  BP: (!) 166/82 132/64  Pulse: 81   Height: 5' 6.5" (1.689 m)   Weight: 214 lb (97.1 kg)   SpO2: 94%   BMI (Calculated): 34.03     Wt Readings from Last 3 Encounters:  12/30/23 214 lb (97.1 kg)  12/19/23 212 lb (96.2 kg)  11/27/23 208 lb 12.8 oz (94.7 kg)    GEN: Well nourished, well developed in no acute distress NECK: No JVD; No carotid bruits CARDIAC: RRR, no murmurs, rubs, gallops RESPIRATORY:  Clear to auscultation without rales, wheezing or rhonchi  ABDOMEN: Soft, non-tender, non-distended EXTREMITIES:  No edema; No deformity   ASSESSMENT AND PLAN: .    NICM - Euvolemic and  well compensated on exam. 03/2023 LVEF 40-45%. Requiring PRN Lasix  about once per week for LE edema.GDMT Jardiance  10mg  daily, PRN Lasix .  Update echocardiogram to reassess LVEF. If persistently reduced consider up-titration of medical therapy such as ARB/ARNI or MRA.  If recurrent yeast infection, may need to discontinue Jardiance .  PVC - No recent palpitations. Continue Diltiazem  120mg  daily.  HTN - BP reasonably controlled in clinic and well controlled by home readings. Continue Diltiazem  120mg  daily,  HLD, LDL goal <70 / Aortic atherosclerosis - Did not  tolerate simvastatin  due to myositis. Continue Repatha .  FLP in 2 months (will defer today as recently missed dose of Repatha ) S/p ICD - Follows with EP IFP - Follows with pulmonology       Dispo: follow up as scheduled with EP and in 2-3 months (prefers to re-establish at [facility] location)  Signed, Clearnce Curia, NP

## 2023-12-31 ENCOUNTER — Telehealth: Payer: Self-pay

## 2023-12-31 LAB — CUP PACEART REMOTE DEVICE CHECK
Battery Remaining Longevity: 3 mo — CL
Battery Remaining Percentage: 2 %
Brady Statistic RV Percent Paced: 0 %
Date Time Interrogation Session: 20250512140500
HighPow Impedance: 74 Ohm
Implantable Lead Connection Status: 753985
Implantable Lead Implant Date: 20050603
Implantable Lead Location: 753860
Implantable Lead Model: 185
Implantable Lead Serial Number: 116340
Implantable Pulse Generator Implant Date: 20110505
Lead Channel Impedance Value: 549 Ohm
Lead Channel Pacing Threshold Amplitude: 0.8 V
Lead Channel Pacing Threshold Pulse Width: 0.4 ms
Lead Channel Setting Pacing Amplitude: 2.4 V
Lead Channel Setting Pacing Pulse Width: 0.4 ms
Lead Channel Setting Sensing Sensitivity: 0.4 mV
Pulse Gen Serial Number: 266301
Zone Setting Status: 755011

## 2023-12-31 NOTE — Telephone Encounter (Signed)
 Original message from pt  Aneita Keens, RN    12/31/23  8:27 AM Note Defer to Dr. Bridgett Camps on the patient.     Markel Silber Breden to [facility] Clinical Pool (supporting Kenney Peacemaker, MD)     12/30/23 10:55 PM The doctor made a decision to put my colonoscopy off last year. I would like to know the status of getting this done since each time I have had polyps.

## 2023-12-31 NOTE — Telephone Encounter (Signed)
 I spoke to pt to inform her that Dr. Bridgett Camps would recommend seeing her for an office visit to discuss surveillance colonoscopy and appropriateness of the test for her. Pt verbalized understanding. Assisted in sched follow-up office visit for 7/25 at 10:10. Pt aware to arrive 15 minutes early.

## 2023-12-31 NOTE — Telephone Encounter (Signed)
 Defer to Dr. Bridgett Camps on the patient.

## 2023-12-31 NOTE — Telephone Encounter (Signed)
 Would recommend patient see me in the office to discuss surveillance colonoscopy and appropriateness of the test for her

## 2023-12-31 NOTE — Telephone Encounter (Signed)
 Dr. Bridgett Camps,   Please advise regarding when pt should have colonoscopy. Pt saw Dr. Willy Harvest on 02/22/23 for office visit.   Thanks,  Philipe Laswell, LPN

## 2024-01-02 ENCOUNTER — Encounter: Payer: Self-pay | Admitting: Podiatry

## 2024-01-02 ENCOUNTER — Ambulatory Visit (INDEPENDENT_AMBULATORY_CARE_PROVIDER_SITE_OTHER): Admitting: Podiatry

## 2024-01-02 DIAGNOSIS — M7751 Other enthesopathy of right foot: Secondary | ICD-10-CM

## 2024-01-02 NOTE — Telephone Encounter (Signed)
 How is the hold esbreit going?

## 2024-01-02 NOTE — Progress Notes (Signed)
 She presents today for follow-up of her third toe right foot.  She states it is still black but it does not hurt any longer.  Objective: Vital signs are stable alert oriented x 3.  Pulses are palpable.  Toe is no longer painful it is more easily movable at the DIPJ level.  Assessment: Well-healing arthritic toe.  Plan: I feel that the cyst and the arthritic toe just collapsed resulting in her pain and arthritides that she was experiencing pain.  I do think that this postinflammatory hyperpigmentation should go ahead and resolve in the near future.

## 2024-01-02 NOTE — Telephone Encounter (Signed)
 See additional 01/01/24 telephone note.

## 2024-01-04 ENCOUNTER — Other Ambulatory Visit: Payer: Self-pay | Admitting: Neurology

## 2024-01-04 ENCOUNTER — Encounter (HOSPITAL_BASED_OUTPATIENT_CLINIC_OR_DEPARTMENT_OTHER): Payer: Self-pay | Admitting: Family

## 2024-01-05 ENCOUNTER — Ambulatory Visit: Payer: Self-pay | Admitting: Cardiology

## 2024-01-06 NOTE — Telephone Encounter (Signed)
 Requested Prescriptions   Pending Prescriptions Disp Refills   cloBAZam  (ONFI ) 10 MG tablet [Pharmacy Med Name: cloBAZam  10 MG Oral Tablet] 60 tablet 0    Sig: Take 1 tablet by mouth twice daily   Last Seen Isa Manuel, Video Visit): 09/11/23 no no Last Seen In Office ([place]): 02/27/23  Next Appt: 09/10/24  Last Note Stated: Tolerating Onfi  10 mg twice daily, no recent seizures, will continue. Should be due next month, will try to send as 90 day per patient preference.    Dispenses    Dispensed Days Supply Quantity Provider Pharmacy  CLOBAZAM  10MG        TAB 12/05/2023 30 60 each Phebe Brasil, MD [facility]...  CLOBAZAM  10MG        TAB 11/02/2023 30 60 each Johny Nap, NP [facility]...  CLOBAZAM  10MG        TAB 10/03/2023 30 60 each Johny Nap, NP [facility]...  CLOBAZAM  10MG        TAB 09/02/2023 30 60 each Johny Nap, NP [facility]...  clobazam  10 mg tablet 09/02/2023 30 60 tablet Johny Nap, NP [facility]...  CLOBAZAM  10MG        TAB 08/02/2023 30 60 each Johny Nap, NP [facility]...  CLOBAZAM  10 MG TABLET 07/03/2023 30  Glory Larsen, MD [facility] Mail D...  clobazam  10 mg tablet 07/03/2023 30 60 tablet Glory Larsen, MD [facility] Ma...  CLOBAZAM  10MG        TAB 05/29/2023 30 60 each Johny Nap, NP [facility]...  CLOBAZAM  10 MG TABLET 05/01/2023 30  Glory Larsen, MD [facility] Mail D...  clobazam  10 mg tablet 05/01/2023 30 60 tablet Glory Larsen, MD [facility] Ma...  CLOBAZAM  10 MG TABLET 03/27/2023 30  Glory Larsen, MD [facility] Mail D...  clobazam  10 mg tablet 03/27/2023 30 60 tablet Glory Larsen, MD [facility] Ma...  CLOBAZAM  10 MG TABLET 02/20/2023 30  Glory Larsen, MD [facility] Mail D...  clobazam  10 mg tablet 02/20/2023 30 60 tablet Glory Larsen, MD [facility] Ma...  CLOBAZAM  10MG        TAB  01/17/2023 30 60 each Phebe Brasil, MD [facility] M.Aaron AasAaron Aas

## 2024-01-07 ENCOUNTER — Ambulatory Visit (INDEPENDENT_AMBULATORY_CARE_PROVIDER_SITE_OTHER): Admitting: Podiatry

## 2024-01-07 ENCOUNTER — Encounter: Payer: Self-pay | Admitting: Podiatry

## 2024-01-07 VITALS — Ht 66.5 in | Wt 214.0 lb

## 2024-01-07 DIAGNOSIS — L84 Corns and callosities: Secondary | ICD-10-CM

## 2024-01-07 DIAGNOSIS — D689 Coagulation defect, unspecified: Secondary | ICD-10-CM

## 2024-01-07 NOTE — Progress Notes (Unsigned)
  Subjective:  Patient ID: [name], female    DOB: [birth year],  MRN: [id number]  [name] presents to clinic today for {jgcomplaint:23593}  Chief Complaint  Patient presents with   Nail Problem    Pt is here for [facility] PCP is Dr Cleora Daft and LOV was in January.   New problem(s): None. {jgcomplaint:23593}  PCP is Elwyn Hamper, PA-C.  Allergies  Allergen Reactions   Ace Inhibitors Swelling    Angioedema; makes tongue "break out"    Amitriptyline Hcl Other (See Comments)     makes her too sleepy!   Atorvastatin Other (See Comments)    SEVERE MYALGIA   Dilantin [Phenytoin Sodium Extended] Rash    Severe rash   Duloxetine  Hcl Nausea Only and Other (See Comments)    Sleepiness/ sick  Other Reaction(s): GI Intolerance   Paroxetine Nausea Only    Rapid heartbeat   Ramipril  Other (See Comments)    TONGUE ULCERS    Rosuvastatin Other (See Comments)    SEVERE MYALGIA   Carbamazepine Rash   Codeine  Itching   Phenytoin Sodium Extended Rash   Simvastatin  Other (See Comments)    Increase in CK, myalgias   Wellbutrin  [Bupropion ] Palpitations    Review of Systems: Negative except as noted in the HPI.  Objective: No changes noted in today's physical examination. There were no vitals filed for this visit. [name] is a pleasant 73 y.o. female {jgbodyhabitus:24098} AAO x 3.  Assessment/Plan: No diagnosis found.  No orders of the defined types were placed in this encounter.   None {Jgplan:23602::"-Patient/POA to call should there be question/concern in the interim."}   Return in about 3 months (around 04/08/2024).  [name], DPM      [place] LOCATION: [address] [place] [address]                   Office [phone]   [facility] LOCATION: [address] [place], [place] [address] Office [phone]

## 2024-01-10 NOTE — Addendum Note (Signed)
 Addended by: Lott Rouleau A on: 01/10/2024 01:49 PM   Modules accepted: Orders

## 2024-01-10 NOTE — Progress Notes (Signed)
 Remote ICD transmission.

## 2024-01-14 NOTE — Progress Notes (Unsigned)
  Electrophysiology Office Note:   ID:  [name], DOB 05-29-1951, MRN [id number]  Primary Cardiologist: None Electrophysiologist: Richardo Chandler, MD  {Click to update primary MD,subspecialty MD or APP then REFRESH:1}    History of Present Illness:   [name] is a 73 y.o. female with h/o NICM, VT, CHF s/p ICD, non-obstructive CAD, HTN, Obesity, OSA on CPAP, HTN, and HLD seen today for routine electrophysiology followup.   Since last being seen in our clinic the patient reports doing ***.  she denies chest pain, palpitations, dyspnea, PND, orthopnea, nausea, vomiting, dizziness, syncope, edema, weight gain, or early satiety.   Review of systems complete and found to be negative unless listed in HPI.   EP Information / Studies Reviewed:    EKG is ordered today. Personal review as below.       ICD Interrogation-  reviewed in detail today,  See PACEART report.  Arrhythmia/Device History [employer] single chamber ICD implanted 12/2009 for Chronic systolic CHF     Physical Exam:   VS:  LMP  (LMP Unknown)    Wt Readings from Last 3 Encounters:  01/07/24 214 lb (97.1 kg)  12/30/23 214 lb (97.1 kg)  12/19/23 212 lb (96.2 kg)     GEN: No acute distress *** NECK: No JVD; No carotid bruits CARDIAC: {EPRHYTHM:28826}, no murmurs, rubs, gallops RESPIRATORY:  Clear to auscultation without rales, wheezing or rhonchi  ABDOMEN: Soft, non-tender, non-distended EXTREMITIES:  {EDEMA LEVEL:28147::"No"} edema; No deformity   ASSESSMENT AND PLAN:    Chronic systolic CHF  s/p [employer] single chamber ICD  Ventricular tachycardia Echo 03/2023 LVEF 40-45% euvolemic today Stable on an appropriate medical regimen Normal ICD function See Pace Art report No changes today *** NSVT/VT  HTN Stable on current regimen   OSA  Encouraged nightly CPAP   Disposition:   Follow up with {EPPROVIDERS:28135} {EPFOLLOW UP:28173}   Signed, Tylene Galla, PA-C

## 2024-01-15 ENCOUNTER — Ambulatory Visit: Attending: Student | Admitting: Student

## 2024-01-15 ENCOUNTER — Ambulatory Visit: Payer: Self-pay | Admitting: Cardiology

## 2024-01-15 ENCOUNTER — Encounter: Payer: Self-pay | Admitting: Student

## 2024-01-15 VITALS — BP 116/74 | HR 77 | Ht 66.5 in | Wt 208.6 lb

## 2024-01-15 DIAGNOSIS — I10 Essential (primary) hypertension: Secondary | ICD-10-CM | POA: Insufficient documentation

## 2024-01-15 DIAGNOSIS — I472 Ventricular tachycardia, unspecified: Secondary | ICD-10-CM | POA: Insufficient documentation

## 2024-01-15 DIAGNOSIS — Z95810 Presence of automatic (implantable) cardiac defibrillator: Secondary | ICD-10-CM | POA: Insufficient documentation

## 2024-01-15 DIAGNOSIS — I428 Other cardiomyopathies: Secondary | ICD-10-CM | POA: Insufficient documentation

## 2024-01-15 LAB — CUP PACEART INCLINIC DEVICE CHECK
Date Time Interrogation Session: 20250528082644
HighPow Impedance: 38 Ohm
HighPow Impedance: 76 Ohm
Implantable Lead Connection Status: 753985
Implantable Lead Implant Date: 20050603
Implantable Lead Location: 753860
Implantable Lead Model: 185
Implantable Lead Serial Number: 116340
Implantable Pulse Generator Implant Date: 20110505
Lead Channel Impedance Value: 622 Ohm
Lead Channel Pacing Threshold Amplitude: 0.8 V
Lead Channel Pacing Threshold Pulse Width: 0.4 ms
Lead Channel Sensing Intrinsic Amplitude: 9.3 mV
Lead Channel Setting Pacing Amplitude: 2.4 V
Lead Channel Setting Pacing Pulse Width: 0.4 ms
Lead Channel Setting Sensing Sensitivity: 0.4 mV
Pulse Gen Serial Number: 266301
Zone Setting Status: 755011

## 2024-01-15 NOTE — Patient Instructions (Signed)
 Medication Instructions:  No medication changes today. *If you need a refill on your cardiac medications before your next appointment, please call your pharmacy*  Lab Work: No labwork ordered today. If you have labs (blood work) drawn today and your tests are completely normal, you will receive your results only by: MyChart Message (if you have MyChart) OR A paper copy in the mail If you have any lab test that is abnormal or we need to change your treatment, we will call you to review the results.  Testing/Procedures: No testing ordered today  Follow-Up: At [facility], you and your health needs are our priority.  As part of our continuing mission to provide you with exceptional heart care, our providers are all part of one team.  This team includes your primary Cardiologist (physician) and Advanced Practice Providers or APPs (Physician Assistants and Nurse Practitioners) who all work together to provide you with the care you need, when you need it.  Your next appointment:   5 month(s)  Provider:   You may see Boyce Byes, MD or one of the following Advanced Practice Providers on your designated Care Team:   Mertha Abrahams, [place] Bambi Lever "Jonelle Neri" Lannon, PA-C Suzann Riddle, NP Creighton Doffing, NP    We recommend signing up for the patient portal called "MyChart".  Sign up information is provided on this After Visit Summary.  MyChart is used to connect with patients for Virtual Visits (Telemedicine).  Patients are able to view lab/test results, encounter notes, upcoming appointments, etc.  Non-urgent messages can be sent to your provider as well.   To learn more about what you can do with MyChart, go to [email or web address].

## 2024-01-20 ENCOUNTER — Other Ambulatory Visit: Payer: Self-pay

## 2024-01-20 NOTE — Progress Notes (Signed)
 Specialty Pharmacy Ongoing Clinical Assessment Note  [name] is a 72 y.o. female who is being followed by the specialty pharmacy service for RxSp Interstitial Lung Disease   Patient's specialty medication(s) reviewed today: Pirfenidone    Missed doses in the last 4 weeks: 7   Patient/Caregiver did not have any additional questions or concerns.   Therapeutic benefit summary: Patient is achieving benefit   Adverse events/side effects summary: Experienced adverse events/side effects (Patient reported dizziness, held esbriet  for a week and it resolved. Restarted medication 2 weeks ago and has not had any issues with dizziness.)   Patient's therapy is appropriate to: Continue    Goals Addressed             This Visit's Progress    Stabilization of disease   On track    Patient is on track. Patient will maintain adherence Per visit with Dr. Kandi Oris on 10/28/23, patient's IPF appears to be clinically stable.         Follow up: 6 months  [facility]

## 2024-01-20 NOTE — Progress Notes (Signed)
 Specialty Pharmacy Refill Coordination Note  [name] is a 73 y.o. female contacted today regarding refills of specialty medication(s) Pirfenidone    Patient requested Delivery   Delivery date: 01/24/24   Verified address: [address] [place], [place], [place] [address]   Medication will be filled on 01/23/24.

## 2024-01-30 ENCOUNTER — Ambulatory Visit (INDEPENDENT_AMBULATORY_CARE_PROVIDER_SITE_OTHER)

## 2024-01-30 DIAGNOSIS — I428 Other cardiomyopathies: Secondary | ICD-10-CM

## 2024-01-30 LAB — CUP PACEART REMOTE DEVICE CHECK
Battery Remaining Longevity: 3 mo — CL
Battery Remaining Percentage: 2 %
Brady Statistic RV Percent Paced: 0 %
Date Time Interrogation Session: 20250612030100
HighPow Impedance: 73 Ohm
Implantable Lead Connection Status: 753985
Implantable Lead Implant Date: 20050603
Implantable Lead Location: 753860
Implantable Lead Model: 185
Implantable Lead Serial Number: 116340
Implantable Pulse Generator Implant Date: 20110505
Lead Channel Impedance Value: 587 Ohm
Lead Channel Pacing Threshold Amplitude: 0.8 V
Lead Channel Pacing Threshold Pulse Width: 0.4 ms
Lead Channel Setting Pacing Amplitude: 2.4 V
Lead Channel Setting Pacing Pulse Width: 0.4 ms
Lead Channel Setting Sensing Sensitivity: 0.4 mV
Pulse Gen Serial Number: 266301
Zone Setting Status: 755011

## 2024-02-04 ENCOUNTER — Ambulatory Visit (HOSPITAL_COMMUNITY)
Admission: RE | Admit: 2024-02-04 | Discharge: 2024-02-04 | Disposition: A | Discharge status: Home / Self Care | Source: Ambulatory Visit | Attending: Cardiovascular Disease | Admitting: Cardiovascular Disease

## 2024-02-04 DIAGNOSIS — I428 Other cardiomyopathies: Secondary | ICD-10-CM | POA: Diagnosis present

## 2024-02-04 LAB — ECHOCARDIOGRAM COMPLETE
Area-P 1/2: 3.72 cm2
S' Lateral: 3.4 cm

## 2024-02-05 ENCOUNTER — Encounter (INDEPENDENT_AMBULATORY_CARE_PROVIDER_SITE_OTHER): Payer: Self-pay

## 2024-02-06 ENCOUNTER — Ambulatory Visit (HOSPITAL_BASED_OUTPATIENT_CLINIC_OR_DEPARTMENT_OTHER): Payer: Self-pay | Admitting: Family

## 2024-02-07 ENCOUNTER — Other Ambulatory Visit: Payer: Self-pay

## 2024-02-08 ENCOUNTER — Encounter (INDEPENDENT_AMBULATORY_CARE_PROVIDER_SITE_OTHER): Payer: Self-pay

## 2024-02-10 ENCOUNTER — Telehealth: Payer: Self-pay

## 2024-02-10 ENCOUNTER — Other Ambulatory Visit: Payer: Self-pay | Admitting: Cardiology

## 2024-02-10 ENCOUNTER — Other Ambulatory Visit: Payer: Self-pay

## 2024-02-10 ENCOUNTER — Other Ambulatory Visit: Payer: Self-pay | Admitting: Internal Medicine

## 2024-02-10 NOTE — Telephone Encounter (Signed)
 Pt states she doesn't want to stop the Jardiance  she would like a medication to help with the yeast infections. Please advise

## 2024-02-10 NOTE — Telephone Encounter (Signed)
   Pre-operative Risk Assessment    Patient Name: [name]  DOB: 10/15/1950 MRN: [id number]   Date of last office visit: 01/15/2024, Prentice Passey, PA-C Date of next office visit: NONE   Request for Surgical Clearance    Procedure:  right foot third toe excisional biopsy with MAC  Date of Surgery:  Clearance TBD                                Surgeon: Dr. Norleen Bores, MD, FAAOS Surgeon's Group or Practice Name: [facility] Phone number: [phone] Fax number:  [phone]   Type of Clearance Requested:   - Medical    Type of Anesthesia:  Local    Additional requests/questions:    SignedAsberry KANDICE Dunning   02/10/2024, 3:02 PM

## 2024-02-10 NOTE — Progress Notes (Signed)
 Specialty Pharmacy Refill Coordination Note  [name] is a 73 y.o. female contacted today regarding refills of specialty medication(s) Pirfenidone    Patient requested Delivery   Delivery date: 02/18/24   Verified address: [address]   Medication will be filled on 06.30.25.

## 2024-02-10 NOTE — Telephone Encounter (Signed)
 [name],  [name] was recently seen by you in clinic.  She remained stable from a cardiac standpoint at that time.  Her ICD was functioning normally.  No changes were made to her device at that time.  Follow-up in 5 months was planned.  She is requesting right foot third toe excisional biopsy.  Would you be able to comment on cardiac risk for upcoming procedure?  Thank you for your help.  Please direct your response to CVD IV preop pool.  [name]. Athaliah Baumbach NP-C     02/10/2024, 4:18 PM [facility] [address] Office [phone] Fax [phone]

## 2024-02-11 ENCOUNTER — Encounter: Payer: Self-pay | Admitting: *Deleted

## 2024-02-11 NOTE — Telephone Encounter (Signed)
 FYI

## 2024-02-11 NOTE — Telephone Encounter (Signed)
     Primary Cardiologist: None  Chart reviewed as part of pre-operative protocol coverage. Given past medical history and time since last visit, based on ACC/AHA guidelines, Kamela Blansett would be at acceptable risk for the planned procedure without further cardiovascular testing.   Per Jodie Passey, PA-C:  No concerns from a cardiac perspective.  The patient is at low risk to proceed without further work up.  If the patient has new chest pain or SOB prior to surgery, they should be revaluated.  I will route this recommendation to the requesting party via Epic fax function and remove from pre-op pool.  [name]. Davon Abdelaziz NP-C    02/11/2024, 8:46 AM [facility] [address] Office [phone] Fax [phone]

## 2024-02-12 ENCOUNTER — Ambulatory Visit (INDEPENDENT_AMBULATORY_CARE_PROVIDER_SITE_OTHER): Admitting: Internal Medicine

## 2024-02-12 DIAGNOSIS — R0989 Other specified symptoms and signs involving the circulatory and respiratory systems: Secondary | ICD-10-CM

## 2024-02-12 DIAGNOSIS — R062 Wheezing: Secondary | ICD-10-CM

## 2024-02-12 DIAGNOSIS — J84112 Idiopathic pulmonary fibrosis: Secondary | ICD-10-CM

## 2024-02-12 DIAGNOSIS — Z5181 Encounter for therapeutic drug level monitoring: Secondary | ICD-10-CM

## 2024-02-12 DIAGNOSIS — Z86711 Personal history of pulmonary embolism: Secondary | ICD-10-CM

## 2024-02-12 DIAGNOSIS — R7989 Other specified abnormal findings of blood chemistry: Secondary | ICD-10-CM

## 2024-02-12 DIAGNOSIS — Z9109 Other allergy status, other than to drugs and biological substances: Secondary | ICD-10-CM

## 2024-02-12 LAB — PULMONARY FUNCTION TEST
DL/VA % pred: 70 %
DL/VA: 2.85 ml/min/mmHg/L
DLCO unc % pred: 42 %
DLCO unc: 8.97 ml/min/mmHg
FEF 25-75 Pre: 2.26 L/s
FEF2575-%Pred-Pre: 123 %
FEV1-%Pred-Pre: 80 %
FEV1-Pre: 1.93 L
FEV1FVC-%Pred-Pre: 113 %
FEV6-%Pred-Pre: 73 %
FEV6-Pre: 2.23 L
FEV6FVC-%Pred-Pre: 104 %
FVC-%Pred-Pre: 71 %
FVC-Pre: 2.27 L
Pre FEV1/FVC ratio: 85 %
Pre FEV6/FVC Ratio: 100 %

## 2024-02-12 NOTE — Progress Notes (Signed)
Spiro/DLCO performed today. 

## 2024-02-12 NOTE — Patient Instructions (Signed)
Spiro/DLCO performed today. 

## 2024-02-12 NOTE — Telephone Encounter (Signed)
 Rx Diflucan  150mg  tablet once. She may continue Jardiance . However, if she has recurrent UTI or yeast infection will likely need to stop Jardiance  as it can contribute.  Mccartney Chuba S Rhilynn Preyer, NP

## 2024-02-13 MED ORDER — FLUCONAZOLE 150 MG PO TABS
150.0000 mg | ORAL_TABLET | Freq: Once | ORAL | 0 refills | Status: AC
Start: 2024-02-13 — End: 2024-02-13

## 2024-02-14 ENCOUNTER — Telehealth: Payer: Self-pay | Admitting: Internal Medicine

## 2024-02-14 NOTE — Telephone Encounter (Signed)
 Fax received from Dr. Norleen Armor with [facility] to perform a right foot third toe excisional bx with MAC and local anesthesia on patient.  Patient needs surgery clearance. Surgery is pending. Patient was seen on 10/28/23. Office protocol is a risk assessment can be sent to surgeon if patient has been seen in 60 days or less.   She has ov with MR 02/18/24 for review of recent PFT and will add to visit notes that the risk assessment is needed. Will hold in clearance pool until visit is complete.

## 2024-02-17 NOTE — Progress Notes (Signed)
 Remote ICD transmission.

## 2024-02-17 NOTE — Addendum Note (Signed)
 Addended by: TAWNI DRILLING D on: 02/17/2024 12:23 PM   Modules accepted: Orders

## 2024-02-18 ENCOUNTER — Ambulatory Visit (INDEPENDENT_AMBULATORY_CARE_PROVIDER_SITE_OTHER): Admitting: Internal Medicine

## 2024-02-18 ENCOUNTER — Encounter: Payer: Self-pay | Admitting: Internal Medicine

## 2024-02-18 VITALS — BP 108/68 | HR 70 | Ht 66.5 in | Wt 207.0 lb

## 2024-02-18 DIAGNOSIS — I429 Cardiomyopathy, unspecified: Secondary | ICD-10-CM

## 2024-02-18 DIAGNOSIS — Z5181 Encounter for therapeutic drug level monitoring: Secondary | ICD-10-CM

## 2024-02-18 DIAGNOSIS — R053 Chronic cough: Secondary | ICD-10-CM

## 2024-02-18 DIAGNOSIS — J84112 Idiopathic pulmonary fibrosis: Secondary | ICD-10-CM | POA: Diagnosis not present

## 2024-02-18 DIAGNOSIS — Z86711 Personal history of pulmonary embolism: Secondary | ICD-10-CM

## 2024-02-18 DIAGNOSIS — Z87891 Personal history of nicotine dependence: Secondary | ICD-10-CM

## 2024-02-18 DIAGNOSIS — R0609 Other forms of dyspnea: Secondary | ICD-10-CM | POA: Diagnosis not present

## 2024-02-18 DIAGNOSIS — I96 Gangrene, not elsewhere classified: Secondary | ICD-10-CM

## 2024-02-18 DIAGNOSIS — G4733 Obstructive sleep apnea (adult) (pediatric): Secondary | ICD-10-CM | POA: Diagnosis not present

## 2024-02-18 DIAGNOSIS — R0989 Other specified symptoms and signs involving the circulatory and respiratory systems: Secondary | ICD-10-CM

## 2024-02-18 DIAGNOSIS — Z9109 Other allergy status, other than to drugs and biological substances: Secondary | ICD-10-CM

## 2024-02-18 LAB — D-DIMER, QUANTITATIVE: D-Dimer, Quant: 0.4 ug{FEU}/mL (ref <0.50)

## 2024-02-18 MED ORDER — BENZONATATE 200 MG PO CAPS: 200.0000 mg | ORAL_CAPSULE | Freq: Three times a day (TID) | ORAL | 2 refills | Status: DC | PRN

## 2024-02-18 MED ORDER — GABAPENTIN 300 MG PO CAPS: ORAL_CAPSULE | ORAL | 1 refills | Status: DC

## 2024-02-18 NOTE — Patient Instructions (Addendum)
 IPF (idiopathic pulmonary fibrosis) (HCC) Medication monitoring encounter History of abnormal liver function History of chronic systolic dysfunction   -Clinically IPF likely progressive because of drop in DLCO but need to rule out pulmonary hypertension [pulmonary hypertension was absent in echo 2024] -Dizziness has resolved after Pirfenidone  (Esbriet ) rechallenge in the fall 2024  -Currently tolerating Esbriet  well without any side effects although liver function test was elevated in January 2025     Plan -Check liver function test today and INR 3 02/18/2024 -Check BNP 02/18/2024 - monitor o2 levels - Do high-resolution CT chest supine and prone in to 2-3 months - continue esbreit  - if progression on CT, consider clinical trials in fall 2025 and potential new approved anti fibrotic in Jan 2026  History of 2019 novel coronavirus disease (COVID-19) History of pulmonary embolism April 02, 2023 Throat burning with Pradaxa   - Doing well on Eliquis    Plan -Continue Eliquis  full doe - Check D-dimer for monitoring purposes - List PRADAXA  as allergy (throat burning)  Chronic cough 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 History of asthma   - 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  Plan -Check CBC with differential to see if he will be a candidate for biologic therapy against eosinophils --Continue allergy/asthma treatment of Symbicort  and Singulair , Flonase  and Allegra as before - Continue house dust mite control as you have been doing - Referral voice rehab - Start gabapentin  300 mg once daily at night for 7 days followed by 300 mg twice daily for next 7 days and 300 mg 3 times daily to cotninue  - if it makes you groggy call us    #Black right third toe  Plan  - Okay to have foot surgery and biopsy  OSA (obstructive sleep apnea)  - Plan - Under the care of  sleep doctor Dr. MALVA   Followup  -- 2-67months to  to see Dr. Geronimo in a  30-minute visit  -Symptom score and exercise hypoxemia test at follow-up  - 30-minute visit after CT chest

## 2024-02-18 NOTE — Progress Notes (Signed)
 OV 08/23/2022 -refer by Dr. Shellia to Dr. Geronimo at the pulmonary fibrosis center.  Subjective:  Patient ID: [name], female , DOB: 11-25-1950 , age 73 y.o. , MRN: [id number] , ADDRESS: [address] [place] [address] PCP Tanda Bleacher, MD Patient Care Team: Tanda Bleacher, MD as PCP - General (Family Medicine) Fernande Elspeth BROCKS, MD as PCP - Electrophysiology (Cardiology) Dann Candyce RAMAN, MD as PCP - Cardiology (Cardiology) Curvin Deward MOULD, MD as Consulting Physician (General Surgery) Lanny Callander, MD as Consulting Physician (Hematology) Keenan Hastings, MD as Consulting Physician (Radiation Oncology) Tyree Nanetta SAILOR, RN as Registered Nurse Glean Stephane BROCKS, RN as Registered Nurse Fernande Elspeth BROCKS, MD as Consulting Physician (Cardiology) Letha Truman ORN, NP (Inactive) as Nurse Practitioner (Nurse Practitioner) Moses Powell Hummer, NP as Nurse Practitioner (Nurse Practitioner) Shellia Oh, MD as Consulting Physician (Pulmonary Disease) Curvin Deward MOULD, MD as Consulting Physician (General Surgery) Dolphus Reiter, MD as Consulting Physician (Rheumatology)  This Provider for this visit: Treatment Team:  Attending Provider: Geronimo Amel, MD    08/23/2022 -   Chief Complaint  Patient presents with   Consult    Referred for ILD, states DOE     HPI [name] 73 y.o. -referred by Dr. Shellia to Dr. Geronimo the fibrosis center.  History is gained by review of the chart, talking to the patient in the ILD questionnaire that the patient fell.  There is no one accompanying the patient.  She has been following with Dr. Shellia at least for the last 3 to 4 years for a diagnosis of bronchiectasis.  She says at baseline she is doing fairly well but then in October 2022 she suffered from COVID-19 and then was hospitalized.  Review of the records indicate that she was hospitalized for total knee replacement of the right knee early August 2022 for 1  day.  Then a few weeks later on 06/03/2021 she was hospitalized for worsening respiratory failure which she believes was because of COVID-19 but review the records indicate that she actually had COVID in September 2022 and was treated with Paxlovid  as an outpatient.  At this hospitalization the COVID was negative.  She was actually hypoxemic.  Pulmonary embolism was ruled out.  This admission RVP and COVID were normal.  Procalcitonin was normal.  Final diagnosis was acute on chronic hypoxemic respiratory failure due to bronchiectasis and pulmonary fibrosis.  She says since then she has been gradually weaned of oxygen.  She says overall she is better since the hospitalization October 2022 but she is not back to her baseline from before the hospitalization.  She has dyspnea on exertion.  Most recently November 2023 Dr. Shellia did high-resolution CT scan of the chest that is concerning for honeycombing findings and progressive changes [personally visualized and I do not fully agree].  Therefore she has been referred here.  Pulmonary function testing shows still blood in DLCO but the FVC seems reduced recently.   All the question is as follows  Most recently she is use 2 L of oxygen with exertion.   [place] [facility] Comprehensive ILD Questionnaire  Symptoms:  Other than dyspnea she also has a cough.  The cough is a since December 2023.  Although currently it is not there.   Past Medical History :  -She marked positive for COPD according to history but does not not be correct. -She is have OSA for which she is previous Dr. Shellia - She does have  a longstanding history of acid reflux/hiatal hernia No history of tuberculosis -She has a history of breast cancer with radiation to the right breast in 2018 -Per the records she suffers from chronic pain syndrome with a history of right brain aneurysm clipping 2001 and cervical stenosis. -She has acid reflux disease for which she sees Dr. Albertus -She has  overactive bladder -She has nonischemic cardiomyopathy for which she sees Dr. Fernande  -EF 35-40% in 2013 but as high as 50-55% in December 2022   ROS:  She has longstanding chronic pain and gait abnormality  FAMILY HISTORY of LUNG DISEASE:  She has arthralgia - She does have dry eyes -She does have heartburn -Denies family history of lung disease  PERSONAL EXPOSURE HISTORY:  -Denies family history of lung disease -She smoked between 1966 in 73 1/2 pack/day.  No marijuana no cocaine no vaping  HOME  EXPOSURE and HOBBY DETAILS :  -Her current home was built in 1973 and she has been in this house since the last 25 years.  She does use a CPAP mask.  She does use a nebulizer machine.  She does do some occasional gardening but otherwise detailed organic antigen history is negative.  OCCUPATIONAL HISTORY (122 questions) : She is to work for [employer] and then got disabled and then retired.  Detail organic and inorganic antigen history exposure at work is negative.  PULMONARY TOXICITY HISTORY (27 items):  -Did get rid Dacian to the right breast in 2018  INVESTIGATIONS: -     CT Chest data - HRCT Nov 2023 -personally visualized.  Not fully sure if that is actually UIP or not.  If that is UIP then that negative prognostic marker.  I am also not sure about progression.  With this allowed to be clarified with multidisciplinary case conference.   Narrative & Impression  CLINICAL DATA:  73 year old female with history of shortness of breath. Evaluate for interstitial lung disease.   EXAM: CT CHEST WITHOUT CONTRAST   TECHNIQUE: Multidetector CT imaging of the chest was performed following the standard protocol without intravenous contrast. High resolution imaging of the lungs, as well as inspiratory and expiratory imaging, was performed.   RADIATION DOSE REDUCTION: This exam was performed according to the departmental dose-optimization program which includes automated exposure  control, adjustment of the mA and/or kV according to patient size and/or use of iterative reconstruction technique.   COMPARISON:  CTA of the chest 08/30/2021.   FINDINGS: Cardiovascular: Heart size is normal. There is no significant pericardial fluid, thickening or pericardial calcification. There is aortic atherosclerosis, as well as atherosclerosis of the great vessels of the mediastinum and the coronary arteries, including calcified atherosclerotic plaque in the left anterior descending coronary artery. Left-sided pacemaker/AICD device in place with lead tip terminating in the right ventricular apex.   Mediastinum/Nodes: No pathologically enlarged mediastinal or hilar lymph nodes. Please note that accurate exclusion of hilar adenopathy is limited on noncontrast CT scans. Esophagus is unremarkable in appearance. No axillary lymphadenopathy.   Lungs/Pleura: High-resolution images demonstrate widespread but patchy areas of ground-glass attenuation, septal thickening, subpleural reticulation, parenchymal banding, traction bronchiectasis, peripheral bronchiolectasis and extensive honeycombing. These findings have a definitive craniocaudal gradient and are progressive compared to the prior examination from 08/30/2021. Inspiratory and expiratory imaging is unremarkable. No acute confluent consolidative airspace disease. No pleural effusions. No definite suspicious appearing pulmonary nodules or masses are noted.   Upper Abdomen: Aortic atherosclerosis. Colonic diverticulosis noted in the splenic flexure of the colon.  Musculoskeletal: There are no aggressive appearing lytic or blastic lesions noted in the visualized portions of the skeleton.   IMPRESSION: 1. 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 2. Aortic atherosclerosis, in addition to left  anterior descending coronary artery disease. Assessment for potential risk factor modification, dietary therapy or pharmacologic therapy may be warranted, if clinically indicated. 3. Colonic diverticulosis without evidence of acute diverticulitis at this time.   Aortic Atherosclerosis (ICD10-I70.0).     Electronically Signed   By: Toribio Aye M.D.   On: 07/08/2022 10:54     OV 11/22/2022  Subjective:  Patient ID: [name], female , DOB: 1950-09-28 , age 78 y.o. , MRN: [id number] , ADDRESS: [address] [place] [address] PCP Tanda Bleacher, MD Patient Care Team: Tanda Bleacher, MD as PCP - General (Family Medicine) Fernande Elspeth BROCKS, MD as PCP - Electrophysiology (Cardiology) Dann Candyce RAMAN, MD as PCP - Cardiology (Cardiology) Curvin Deward MOULD, MD as Consulting Physician (General Surgery) Lanny Callander, MD as Consulting Physician (Hematology) Keenan Hastings, MD as Consulting Physician (Radiation Oncology) Tyree Nanetta SAILOR, RN as Registered Nurse Glean Stephane BROCKS, RN as Registered Nurse Fernande Elspeth BROCKS, MD as Consulting Physician (Cardiology) Letha Truman ORN, NP (Inactive) as Nurse Practitioner (Nurse Practitioner) Moses Powell Hummer, NP as Nurse Practitioner (Nurse Practitioner) Shellia Oh, MD as Consulting Physician (Pulmonary Disease) Curvin Deward MOULD, MD as Consulting Physician (General Surgery) Dolphus Reiter, MD as Consulting Physician (Rheumatology)  This Provider for this visit: Treatment Team:  Attending Provider: Geronimo Amel, MD    11/22/2022 -   Chief Complaint  Patient presents with   Follow-up    F/u pft 11/15/22. Labs,      HPI [name] 73 y.o. -returns for follow-up.  This is potential ILD workup in progress.  She states in the interim her son who is age 45 did have a heart transplant for congestive heart failure 3 weeks ago at [facility] he is home and is improving.  She is happy about that.  She  also reports that after her COVID she was prescribed Symbicort  she is run out of the Symbicort  for the last 2 weeks but this is not really bothering her.  She feels stable she has occasional cough.  Be here to review the results.  She had pulmonary function test that shows now a consistent pattern of decline her FVC is declined 9% in the last 4 years.  I went back and visualized all her CT scans of the chest.  Her CT scan in 2013 and a high-resolution CT chest in 2020 look about similar with some bronchiectasis.  However there is dramatic change between 2020 and also the high-resolution CT chest and late 2023.  This seems a lot more traction bronchiectasis.  There is definite craniocaudal gradient.  She has crackles here.  Initially I was not sure if this was classic UIP but clinically this makes sense especially of the lower lobe crackles.  Was supposed to discuss this in the case conference but this did not happen.  Every posted for discussion of the case conference but nevertheless his decline in PFTs and also this change now in the CT scan of the chest.  In the interim his serologies are normal.  I think at the least she has some plan for progressive ILD .  Possible is IPF but I would like to confirm this and discussion  of the case conference.  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  Went over the 2 antifibrotic's nintedanib and pirfenidone .  Shows pirfenidone  because of the diarrhea side effects with nintedanib.  She has knee pain and be hard to get to the toilet.  In addition she has nonischemic cardiomyopathy and this blackbox warning for MI risk with with nintedanib.  [Even though she does not have ischemic cardiomyopathy].  Told her that pirfenidone  associated more with weight loss nausea anorexia.  Both drugs required liver function monitoring.  Generally both drugs have reversible side effects.  In balance which was  pirfenidone .  I did note mild transaminitis in the past will check liver function test again today.   [id number]      OV 01/03/2023  Subjective:  Patient ID: [name], female , DOB: March 03, 1951 , age 68 y.o. , MRN: [id number] , ADDRESS: [address] [place] [address] PCP Tanda Bleacher, MD Patient Care Team: Tanda Bleacher, MD as PCP - General (Family Medicine) Fernande Elspeth BROCKS, MD as PCP - Electrophysiology (Cardiology) Dann Candyce RAMAN, MD as PCP - Cardiology (Cardiology) Curvin Deward MOULD, MD as Consulting Physician (General Surgery) Lanny Callander, MD as Consulting Physician (Hematology) Keenan Hastings, MD as Consulting Physician (Radiation Oncology) Tyree Nanetta SAILOR, RN as Registered Nurse Glean Stephane BROCKS, RN as Registered Nurse Fernande Elspeth BROCKS, MD as Consulting Physician (Cardiology) Letha Truman ORN, NP (Inactive) as Nurse Practitioner (Nurse Practitioner) Moses Powell Hummer, NP as Nurse Practitioner (Nurse Practitioner) Shellia Oh, MD as Consulting Physician (Pulmonary Disease) Curvin Deward MOULD, MD as Consulting Physician (General Surgery) Dolphus Reiter, MD as Consulting Physician (Rheumatology)  This Provider for this visit: Treatment Team:  Attending Provider: Geronimo Amel, MD    01/03/2023 -   Chief Complaint  Patient presents with   Follow-up    F/up wheezing, congestion, cough, sob with exertion.     HPI Jadalyn Oliveri 73 y.o. -returns for follow-up.  At this visit she was supposed to have started pirfenidone  but what happened is that she had abnormal liver enzymes at the time of last visit.  We found that she was taking a lot of Tylenol  for the knee pain.  She is now saying she did not take a lot but she actually did.  Her liver enzymes have since normalized as of December 07, 2022.  She is concerned about a high total protein.  She wanted to discuss this.  I did recommend to her that she  discuss this with primary care physician.  Then she brought her back pain.  I also suggested she discuss with primary care physician.  She wants repeat liver enzymes check especially because of total protein which she believes is because of the Repatha .  Meanwhile she continues to be symptomatic.  We discussed in the case conference in the results are below.  I did share these results with the patient.     MDD conference Ronal 2024: Definitely progressive 2013 very mild non specific ILD. 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 NOv 2023: most rcent versus Feb 2020 HRCT. In msost recent Ct  basilar predmon, ILD, Subpleural reticuation with prominent TB. There is clear honeycombing (moderate) in lung base. No air trapping. Progressed sinc2020.  C/w UIP. In 2020: read as alternate diagnosis but Dr Leonce thinks in retrospect is mild to moderate  probable or c/w UIP with mild honeycombing. The bronchiectasis all traction bronchiectasis  of fibrosis does not need bipsy. Call it IPF. Rx as IPF   We then discussed the antifibrotic's again.  We went over nintedanib or pirfenidone  again.  We stuck with the original decision on taking pirfenidone  and she is committing to it.  I will send a note to pharmacy.  Will check liver function test today.  03/12/2023 Acute OV : Covid 19 infection  Presents for an acute office visit.  She complains of 1 week of cough, congestion, increased shortness of breath.  She tested positive for COVID-19 on March 08, 2023.  She was treated with a Z-Pak.  She has completed this in full.  Continues to have some ongoing mucus that is thick.  Denies any fever, hemoptysis, chest pain.  She remains on Symbicort  twice daily. She is feeling some better. Concerned that her mucus is still thick. Has nasal congestion and drainage. Still feels weak. Appetite is okay, no n/v/d.   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 She was started on Esbriet  but unable to start due to elevated liver enzymes . Was  seen by GI diagnosed with fatty liver. Has follow up next month with Dr. Geronimo for ILD.   Has sleep apnea on CPAP At bedtime . Does not wear oxygen at home.        OV 03/29/2023  Subjective:  Patient ID: [name], female , DOB: 1951-01-03 , age 10 y.o. , MRN: [id number] , ADDRESS: [address] [place] [address] PCP Lorren Greig PARAS, NP Patient Care Team: Lorren Greig PARAS, NP as PCP - General (Nurse Practitioner) Fernande Elspeth BROCKS, MD as PCP - Electrophysiology (Cardiology) Dann Candyce RAMAN, MD as PCP - Cardiology (Cardiology) Curvin Deward MOULD, MD as Consulting Physician (General Surgery) Lanny Callander, MD as Consulting Physician (Hematology) Keenan Hastings, MD as Consulting Physician (Radiation Oncology) Tyree Nanetta SAILOR, RN as Registered Nurse Glean Stephane BROCKS, RN as Registered Nurse Fernande Elspeth BROCKS, MD as Consulting Physician (Cardiology) Letha Truman ORN, NP (Inactive) as Nurse Practitioner (Nurse Practitioner) Moses Powell Hummer, NP as Nurse Practitioner (Nurse Practitioner) Shellia Oh, MD as Consulting Physician (Pulmonary Disease) Curvin Deward MOULD, MD as Consulting Physician (General Surgery) Dolphus Reiter, MD as Consulting Physician (Rheumatology)  This Provider for this visit: Treatment Team:  Attending Provider: Geronimo Amel, MD    03/29/2023 -   Chief Complaint  Patient presents with   Acute Visit    Cough but not productive, runny nose.     HPI Adriana Beadles 72 y.o. -acute visit for this IPF patient who has not been able to start antifibrotic's because of elevated liver enzymes.  Since her last visit she did see Dr. Sheran Phoenix who could not find an identifiable etiology for the elevated liver enzymes advised a low carbohydrate diet which she has been following.  She comes in because she is acutely ill.  She tells me that she did see Madelin [name] March 12, 2023 leory reviewed] had acute COVID-19..  Now she is calling  acutely for cough.  She says cough is getting worse for the last 2 days.  This headaches are some back pressure there is also shortness of breath for 2 days she feels some chills but no fever no sputum production.  Therefore she is here.  She does use oxygen at night.  In the past have tried to do excess hypoxemia test but unable to because of her knee issue.  Today she was able to do and she did desaturate to 81%  on a sit/stand test after doing it 10 times.  But I do not have a baseline to compare to.   TEST/EVENTS :  PFT 04/17/12 >> FEV1 2.60 (110%), FEV1% 85, TLC 4.05 (77%), DLCO 44% PFT 01/17/17 >> FEV1 2.38 (111%), FEV1% 90, TLC 3.85 (71%), DLCO 58%, no BD RAST 05/30/17 >> dust mites PFT 10/16/18 >> FEV1 2.27 (111%), FEV1% 90, TLC 4.00 (74%), DLCO 59% PFT 06/08/22 >> FEV1 2.20 (91%), FEV1% 93, TLC 3.76 (69%), DLCO 62%   Serology:  04/02/12 >> ACE 1 10/25/15 >> ESR 40, ACE 32 05/30/17 >> IgA, IgG, IgM normal 10/16/18 >> HP panel negative, ACE 25   Chest Imaging:  CT chest 04/10/12 >> b/l lower lobe cylindrical BTX and some in upper lobes CT angio chest 10/26/15 >> patchy GGO in periphery of lungs b/l, basilar BTX, no PE; no significant change compared to 2013 HRCT chest 10/08/18 >> atherosclerosis, basilar predominant cylindrical BTX, mild centrilobular/paraseptal emphysema, air trapping CT angio chest 03/24/19 >> Chronic lung changes with peribronchial thickening, bibasilar atelectasis and chronic interstitial disease/peripheral fibrosis at the lung bases CT angio chest 06/03/21 >> scattered GGO with fibrotic changes CT angio chest 08/30/21 >> BTX with emphysema and fibrotic changes in periphery HRCT 06/2022 -Progressive ILD-dx of UIP    Sleep Tests:  PSG 11/29/08 >> AHI 9 HST 11/10/15 >> AHI 7.1, SaO2 low 75% CPAP 06/17/21 to 07/16/21 >> used on 27 of 30 nights with average 5 hrs 24 min.  Average AHI 2.5 with CPAP 5 cm H2O.   Cardiac Tests:  Echo 08/11/21 >> EF 50 to 55%, RVSP 39.5  mmHg  PFT  OV 04/11/2023  Subjective:  Patient ID: [name], female , DOB: 1950/11/08 , age 56 y.o. , MRN: [id number] , ADDRESS: [address] [place] [address] PCP Lorren Greig PARAS, NP Patient Care Team: Lorren Greig PARAS, NP as PCP - General (Nurse Practitioner) Fernande Elspeth BROCKS, MD as PCP - Electrophysiology (Cardiology) Dann Candyce RAMAN, MD as PCP - Cardiology (Cardiology) Curvin Deward MOULD, MD as Consulting Physician (General Surgery) Lanny Callander, MD as Consulting Physician (Hematology) Keenan Hastings, MD as Consulting Physician (Radiation Oncology) Tyree Nanetta SAILOR, RN as Registered Nurse Glean Stephane BROCKS, RN as Registered Nurse Fernande Elspeth BROCKS, MD as Consulting Physician (Cardiology) Letha Truman ORN, NP (Inactive) as Nurse Practitioner (Nurse Practitioner) Moses Powell Hummer, NP as Nurse Practitioner (Nurse Practitioner) Shellia Oh, MD as Consulting Physician (Pulmonary Disease) Curvin Deward MOULD, MD as Consulting Physician (General Surgery) Dolphus Reiter, MD as Consulting Physician (Rheumatology)  This Provider for this visit: Treatment Team:  Attending Provider: Geronimo Amel, MD    04/11/2023 -   Chief Complaint  Patient presents with   Follow-up    F/up on hosp visit, IPF and blood work   HPI [employer] 73 y.o. -returns for follow-up.  She was seen acutely in July 2020 for outpatient COVID then in August 2024 she came in acutely what look like bronchitis ended up turning out to be pulmonary embolism.  She did get admitted.  She has been discharged on Pradaxa  because Eliquis  was expensive.  She is using oxygen 2 L nasal cannula she feels better but he does get tachycardic.  She is worried about the PE.  She is worried about the duration of Pradaxa  and the cost associated with it and previous bleeding risk she says she had a bleeding episode 20 years ago.  I did indicate to her that potentially lifelong treatment because a  second  episode versus at least taking for a year and then reassessing.  In terms of IPF LFTs most recently mid August 2024 are normal.  She is agreed to start pirfenidone .  We did a sit/stand hypoxemia test and she did desaturate again.  I did indicate to her this might not be the PE make her desaturate but the IPF.  She asked about transplant I did indicate to her the 65 is at the upper limit plus she has got obesity.  Did indicate to her that we need to make progress with weight loss and then subsequently we went with the basics of IPF management and then we can decide.  She is using nighttime oxygen on her own.  But is never been tested.  Sleep study was ordered before she saw Dr. MALVA but because of illness this was canceled.  We will reorder this.  OV 05/23/2023  Subjective:  Patient ID: [name], female , DOB: [birth year] , age 45 y.o. , MRN: [id number] , ADDRESS: [address] [place] [address] PCP Lorren Greig PARAS, NP Patient Care Team: Lorren Greig PARAS, NP as PCP - General (Nurse Practitioner) Fernande Elspeth BROCKS, MD as PCP - Electrophysiology (Cardiology) Dann Candyce RAMAN, MD as PCP - Cardiology (Cardiology) Curvin Deward MOULD, MD as Consulting Physician (General Surgery) Lanny Callander, MD as Consulting Physician (Hematology) Keenan Hastings, MD as Consulting Physician (Radiation Oncology) Tyree Nanetta SAILOR, RN as Registered Nurse Glean Stephane BROCKS, RN as Registered Nurse Fernande Elspeth BROCKS, MD as Consulting Physician (Cardiology) Letha Truman ORN, NP (Inactive) as Nurse Practitioner (Nurse Practitioner) Moses Powell Hummer, NP as Nurse Practitioner (Nurse Practitioner) Shellia Oh, MD (Inactive) as Consulting Physician (Pulmonary Disease) Curvin Deward MOULD, MD as Consulting Physician (General Surgery) Dolphus Reiter, MD as Consulting Physician (Rheumatology)  This Provider for this visit: Treatment Team:  Attending Provider: Geronimo Amel, MD    05/23/2023 -   Chief  Complaint  Patient presents with   Followup     She has occ dizzy spells- occurs while at rest. She feels like her breathing has improved since the last visit. She notices rattling in chest at night. Her cough is mainly non prod.      HPI [name] 73 y.o. 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  She finally got a pirfenidone  mid September 2024 based on chart review.  Today she goes up to 3 pills 3 times daily.  She initially said she was tolerating pirfenidone  well but then she admitted that ever since she started the pirfenidone  she is getting dizzy spells.  These are very transient split-second.  They happen spontaneously.  2-3 times a day.  Could be standing or sitting or lying down.  She does not lose any consciousness.  No focal deficit no seizures no falls no double vision no fever no chills but definitely new definitely mild and definitely since starting pirfenidone .  She is not compliant with her oxygen use.  However her exercise hypoxemia test is better in fact her overall shortness of breath with exertion is better. Want to rehab  # Pulmonary embolism August 2024 she continues Pradaxa .  She tells me now for the first time that ever since starting Pradaxa  she is having intense throat burning despite her being on PPI and H2 blockade.  At this straight after taking Pradaxa  does not think that with pirfenidone .  Review of Pradaxa  side effects is that greater than 25% GI side effects.  She could not afford Eliquis  but she is willing to see if this is now affordable.  Meanwhile we will give her a GI cocktail.  I have sent a message to the pharmacy team  #Sleep apnea she is under the care of Dr. MALVA   #Other issues.  She will have a high-dose flu shot today.  I also told her that is okay to travel by air.     APP VISIT 06/21/23   History of Present Illness   Today's video visit is 1 month follow-up.  She is followed for IPF.  She has been started on  Esbriet  but during last visit was having dizziness felt possibly secondary to Esbriet .  Esbriet  was placed on hold until October 12.  She was to restart Esbriet  and titrate up slowly as tolerable.  She was also referred to pulmonary rehab.  She would also recommend to continue on oxygen 2 L with activity The patient had previously been advised to stop taking Esbriet  due to concerns it was causing dizziness. The patient confirmed that the dizziness resolved after discontinuing the medication, but was unaware of the instruction to restart the medication on October 13th. The patient had previously reached the top dose of Esbriet  (three capsules three times daily) . Dizziness did start at lower doses.   In addition to fibrosis, the patient was also dealing with issues related to Pradaxa , a blood thinner, which was causing throat burning. The patient confirmed that switching to Eliquis  resolved this issue.  She denies any known bleeding . Education on not using NSAIDS.   The patient is using oxygen with activity and during physical therapy, as her oxygen level sometimes dropped to as low as 88. The patient is taking Symbicort  twice daily and using albuterol  infrequently. The patient was not very active outside of physical therapy and housework. Likes pulmonary rehab alot.   The patient is  also using a CPAP machine at bedtime and reported feeling rested with its use. The patient had recently been in the ER for chest pain . Workup was unrevealing with neg CT chest for PE or acute process. Labs unrevealing. plans to follow up with cardiology .    The patient had received a pneumonia vaccine in 2022 and was up to date on RSV vaccines.        OV 10/28/2023  Subjective:  Patient ID: [name], female , DOB: 30-Jan-1951 , age 82 y.o. , MRN: [id number] , ADDRESS: [address] [place] [address] PCP Leonce Carola PARAS, PA-C Patient Care Team: Leonce Carola PARAS DEVONNA as PCP - General  (Pediatrics) Fernande Elspeth BROCKS, MD as PCP - Electrophysiology (Cardiology) Dann Candyce RAMAN, MD as PCP - Cardiology (Cardiology) Curvin Deward MOULD, MD as Consulting Physician (General Surgery) Lanny Callander, MD as Consulting Physician (Hematology) Keenan Hastings, MD as Consulting Physician (Radiation Oncology) Tyree Nanetta SAILOR, RN as Registered Nurse Glean Stephane BROCKS, RN as Registered Nurse Fernande Elspeth BROCKS, MD as Consulting Physician (Cardiology) Letha Truman ORN, NP (Inactive) as Nurse Practitioner (Nurse Practitioner) Moses Powell Hummer, NP as Nurse Practitioner (Nurse Practitioner) Shellia Oh, MD (Inactive) as Consulting Physician (Pulmonary Disease) Curvin Deward MOULD, MD as Consulting Physician (General Surgery) Dolphus Reiter, MD as Consulting Physician (Rheumatology)  This Provider for this visit: Treatment Team:  Attending Provider: Geronimo Amel, MD    10/28/2023 -   Chief Complaint  Patient presents with   Follow-up    She c/o wheezing, esp at night x 2 months. Breathing  has been stable. She has nocturnal cough- non prod.     HPI [name] 73 y.o. -returns for follow-up.  I last saw her in the fall 2024.  At that time she had some dizziness with pirfenidone  we stopped the pirfenidone  and then she saw a nurse practitioner.  Nurse practitioner put her on a rechallenge of pirfenidone .  She is currently on full dose pirfenidone  and she is tolerating it well without any dizziness.  She reports stable dyspnea.  She reports good tolerance with pirfenidone  except mild nausea that is transient.  She had pulmonary function test in March 2025 and it is stable.  Her main issue is that she is noticing nocturnal wheeze when she lies down and is in the sofa or in the bed.  However when I played the sound of her wheezing and crackles on YouTube she reported that it was crackles.  She had been reassured by the sleep specialist that this was consistent with the pulmonary  fibrosis.  I shared the same opinion.  However lab review shows that in the past in 2018 she had house dust mite allergy.  She is also on Singulair , Symbicort  and antihistamines at this point.  I did indicate to her we will check a CBC differential and blood IgE.  Also gave advice on house dust mite control.  Of note in January 2025 she ended up in the ER with enteritis and had elevated liver enzyme.  She requires a follow-up today.  Also of note she wants to know if she can drink alcohol .  I did tell her that we need the liver enzymes checked.  She is also on oxycodone .  I told her that multiple agents that can increase risk of liver injury.  She wanted to know if she could to a marijuana gummy I advised her against this.      OV 02/18/2024  Subjective:  Patient ID: [name], female , DOB: 07-30-1951 , age 22 y.o. , MRN: [id number] , ADDRESS: [address] [place] [address] PCP Leonce Carola PARAS, PA-C Patient Care Team: Leonce Carola PARAS DEVONNA as PCP - General (Pediatrics) Cindie Ole DASEN, MD as PCP - Electrophysiology (Cardiology) Curvin Deward MOULD, MD as Consulting Physician (General Surgery) Lanny Callander, MD as Consulting Physician (Hematology) Keenan Hastings, MD as Consulting Physician (Radiation Oncology) Tyree Nanetta SAILOR, RN as Registered Nurse Glean Stephane BROCKS, RN (Inactive) as Registered Nurse Fernande Elspeth BROCKS, MD as Consulting Physician (Cardiology) Letha Truman ORN, NP (Inactive) as Nurse Practitioner (Nurse Practitioner) Moses Powell Hummer, NP as Nurse Practitioner (Nurse Practitioner) Shellia Oh, MD (Inactive) as Consulting Physician (Pulmonary Disease) Curvin Deward MOULD, MD as Consulting Physician (General Surgery) Dolphus Reiter, MD as Consulting Physician (Rheumatology)  This Provider for this visit: Treatment Team:  Attending Provider: Geronimo Amel, MD    02/18/2024 -   Chief Complaint  Patient presents with   Follow-up    Breathing  is stable. She does c/o increased cough for the past 6-8 wks- non prod.     #IPF patient unable to start pirfenidone  because of previous elevations in liver enzymes deemed as fatty liver by GI  - LAST HRCT Nov 2023  - Last CT (angion) aug 2024   #Outpatient COVID-19 in July 2024 followed by pulmonary embolism mid August 2024.  [Previous history of VTE]  -Pradaxa  changed to Eliquis  in fall 2024 after burning in the throat n ddonig well  #Sleep apnea in the process of transfer from Dr.  Sood to Dr. MALVA  #Esbriet /Pirfenidone  requires intensive drug monitoring due to high concerns for Adverse effects of , including  Drug Induced Liver Injury, significant GI side effects that include but not limited to Diarrhea, Nausea, Vomiting,  and other system side effects that include Fatigue, headaches, weight loss and other side effects such as skin rash. These will be monitored with  blood work such as LFT initially once a month for 6 months and then quarterly On   - ELEVated LFT on and off  - Last Jan 2025  #Nonischemic cardiomyopathy ejection fraction 45-50% even in 2021 with normal cardiac stress test most recent echo late 2024.    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 -returns for follow-up.  She says she is feeling stable but she is mostly bothered by chronic cough with clearing of the throat for the last 2 months.  She states that when she is out of the house the cough is less.  She does have dust mite allergy and she says she is taking all the precautions but nevertheless when she is out of the house the cough is less.  She is also clearing the throat a lot.  She is asking for interventions.  But from a shortness of breath  perspective she is stable.  She is tolerating Esbriet  well.  She had pulmonary function testing that shows a continued steady decline in DLCO.  Echocardiogram June 2025 with her pulmonary hypertension.  Last high-res CT was November 2023.  Last CT angiogram chest was August 2024 at the time of pulmonary embolism [post COVID] in terms of PE she is continuing her full dose Eliquis  without any problems 1 year since anticoagulation is coming up.  Despite the DLCO showing decline in her symptom burden and her excess hypoxemia test actually stable.  She is willing to get a high-resolution CT chest for monitoring purposes.  She will also need a D-dimer and BNP to assess for anticoagulation monitoring and also any evidence of pulmonary hypertension.  New issue is that her right toe is black.  She has seen the podiatrist and a biopsy is pending she wanted clearance.      SYMPTOM SCALE - ILD 08/23/2022 11/22/2022  05/23/2023  10/28/2023 Esbriet . 02/18/2024 esbiret  Current weight    Pirfenidone  -today goes to 3 pills 3 times daily    O2 use x ra ra ra ra  Shortness of Breath 0 -> 5 scale with 5 being worst (score 6 If unable to do)      At rest 0 2 1 1 1   Simple tasks - showers, clothes change, eating, shaving 2 3 3 3 3   Household (dishes, doing bed, laundry) 1 4 3 3 3   Shopping 1 3 3 2 2   Walking level at own pace 2 3 3 2 1   Walking up Stairs 3 5 5 5 4   Total (30-36) Dyspnea Score 9 20 18 16 14     Non-dyspnea symptoms (0-> 5 scale) 08/23/2022 01/03/2023  05/23/2023  10/28/2023  02/18/2024   How bad is your cough? 0 3 3 2 3   How bad is your fatigue 0 2 3 1 2   How bad is nausea 00 0 0 0  0  How bad is vomiting?  0 0 0 0 0  How bad is diarrhea? 0 0 0 1 0  How bad is anxiety? 2 4 3 2 2   How bad is depression 2 5 4 2 1   Any chronic pain - if so where and how bad 1 x do spirometry and x Okay BLACK RIGHT TOE   Simple office walk 185 feet x  3 laps goal with forehead probe 11/22/2022 Could not do due to Rt  Knee pain 03/29/2023 - acute PE  04/11/2023 pradaxa  05/23/2023   O2 used x ra ra Room air  Number laps completed x Sit stna x 10 Sit stand x 10 Sit stand x 10  Comments about pace x     Resting Pulse Ox/HR x% and x/min 92% and HR 107 98% and HR 111 98% with a heart rate of 92  Final Pulse Ox/HR x% and x/min 81% and HR 121 85-88% and HR 118 Briefly 88% for 1 second with a peak heart rate of 103  Desaturated </= 88% x yes    Desaturated <= 3% points x yes    Got Tachycardic >/= 90/min x es    Symptoms at end of test x Level 6 dyspnea Level 5 dyspnea Some days near  Miscellaneous comments xx         SIT STAND TEST - goal 15 times   02/18/2024    O2 used ra   PRobe - finter or forehead finger   Number sit and stand completed - goal 15 15   Time taken to complete 15 sec   Resting Pulse Ox/HR/Dyspnea  96% and 83/min and dyspnea of 2/10    Peak measures 95 % and 103/min and dyspnea of 4/10   Final Pulse Ox/HR 91% and 94/min and dyspnea of 2/10   Desaturated </= 88% no   Desaturated <= 3% points yes   Got Tachycardic >/= 90/min yes   Miscellaneous comments Level 4 dyspnea       PFT     Latest Ref Rng & Units 02/12/2024    1:54 PM 10/22/2023   11:01 AM 07/15/2023    1:56 PM 02/19/2023   11:26 AM 08/24/2022    1:55 PM 06/08/2022    3:49 PM 10/16/2018   12:55 PM  PFT Results  FVC-Pre L 2.27  2.26  2.30  2.25  2.30  2.33  2.52   FVC-Predicted Pre % 71  70  72  71  72  73  96   FVC-Post L      2.36  2.49   FVC-Predicted Post %      74  94   Pre FEV1/FVC % % 85  94  91  88  92  93  90   Post FEV1/FCV % %      93  91   FEV1-Pre L 1.93  2.13  2.08  1.97  2.12  2.17  2.27   FEV1-Predicted Pre % 80  88  87  82  88  90  111   FEV1-Post L      2.20  2.26   DLCO uncorrected ml/min/mmHg 8.97  15.53  10.02  11.85  11.05  12.95  12.43   DLCO UNC% % 42  73  48  56  52  62  59   DLCO corrected ml/min/mmHg  15.05  10.14  11.85  11.05  12.95    DLCO COR %Predicted %  71  48  56  52  62    DLVA  Predicted % 70  103  81  94  87  93  85   TLC L      3.76  4.00   TLC % Predicted %      69  74   RV % Predicted %      55  58     LAB RESULTS last 96 hours No results found.   [name]    has a past medical history of AICD (automatic cardioverter/defibrillator) present, Allergy (09/20/1994), Anxiety, Arthritis, Back pain, Breast cancer (HCC) (2016), Bronchiectasis (HCC), Cerebral aneurysm, nonruptured, CHF (congestive heart failure) (HCC), Clostridium difficile infection, Depressive disorder, not elsewhere classified, Diverticulosis of colon (without mention of hemorrhage), Esophageal reflux, Fatty liver, Fibromyalgia, Gastritis, GERD (gastroesophageal reflux disease), GI bleed (2004), Glaucoma, Hiatal hernia, History of COVID-19 (05/04/2021), Hyperlipidemia, Hypertension, Hypothyroidism, Internal hemorrhoids, Joint pain, Multifocal pneumonia (06/03/2021), Obstructive sleep apnea (adult) (pediatric), Osteoarthritis, Ostium secundum type atrial septal defect, Other chronic nonalcoholic liver disease, Other pulmonary embolism and infarction, Palpitations, Paroxysmal ventricular tachycardia (HCC), Personal history of radiation therapy, PONV (postoperative nausea and vomiting), Presence of permanent cardiac pacemaker, PUD (peptic ulcer disease), Radiation (02/03/15-03/10/15), Sarcoid, Schatzki's ring, Shortness of breath, Sleep apnea, Stroke (HCC) (2013), Takotsubo syndrome, Tubular adenoma of colon, Unspecified transient cerebral ischemia, and Unspecified vitamin D  deficiency.   reports that she quit smoking about 34 years ago. Her smoking use included cigarettes. She started smoking about 54 years ago. She has a 20 pack-year smoking history. She has been exposed to tobacco smoke. She has never used smokeless tobacco.  Past Surgical History:  Procedure Laterality Date   ABDOMINAL HYSTERECTOMY  11/22/1998   ABI  2006   normal   BREAST EXCISIONAL BIOPSY     BREAST LUMPECTOMY Right 2016   BREAST  LUMPECTOMY WITH RADIOACTIVE SEED LOCALIZATION Right 01/03/2015   Procedure: BREAST LUMPECTOMY WITH RADIOACTIVE SEED LOCALIZATION;  Surgeon: Deward Null III, MD;  Location: [facility] OR;  Service: General;  Laterality: Right;   CARDIAC DEFIBRILLATOR PLACEMENT  2006; 2012   BSX single chamber ICD   carotid dopplers  2006   neg   CEREBRAL ANEURYSM REPAIR  02/1999   COLONOSCOPY     EYE SURGERY     HAMMER TOE SURGERY  11/15/2020   3rd digit bilateral feet    hospitalization  2004   GI bleed, PUD, diverticulosis (EGD,colonscopy)   hospitalization     PE, NSVT, s/p defib   JOINT REPLACEMENT  05/2021   KNEE ARTHROSCOPY     bilateral   LEFT HEART CATHETERIZATION WITH CORONARY ANGIOGRAM N/A 02/22/2012   Procedure: LEFT HEART CATHETERIZATION WITH CORONARY ANGIOGRAM;  Surgeon: Lonni [name] Cash, MD;  Location: [facility] CATH LAB;  Service: Cardiovascular;  Laterality: N/A;   PARTIAL HYSTERECTOMY     Fibroids   TONGUE BIOPSY  12/12/2017   due to sore tongue and white patches/abnormal cells   TOTAL KNEE ARTHROPLASTY Left 02/13/2016   Procedure: TOTAL KNEE ARTHROPLASTY;  Surgeon: Marcey Raman, MD;  Location: [facility] OR;  Service: Orthopedics;  Laterality: Left;   TOTAL KNEE ARTHROPLASTY Right 05/22/2021   Procedure: TOTAL KNEE ARTHROPLASTY;  Surgeon: Raman Marcey, MD;  Location: [facility] ORS;  Service: Orthopedics;  Laterality: Right;  with block   TUBAL LIGATION  09/20/1978   UPPER GASTROINTESTINAL ENDOSCOPY      Allergies  Allergen Reactions   Ace Inhibitors Swelling    Angioedema; makes tongue break out    Amitriptyline Hcl Other (See Comments)  makes her too sleepy!   Atorvastatin Other (See Comments)    SEVERE MYALGIA   Dilantin [Phenytoin Sodium Extended] Rash    Severe rash   Duloxetine  Hcl Nausea Only and Other (See Comments)    Sleepiness/ sick  Other Reaction(s): GI Intolerance   Paroxetine Nausea Only    Rapid heartbeat   Ramipril  Other (See Comments)    TONGUE ULCERS    Rosuvastatin  Other (See Comments)    SEVERE MYALGIA   Carbamazepine Rash   Codeine  Itching   Phenytoin Sodium Extended Rash   Simvastatin  Other (See Comments)    Increase in CK, myalgias   Wellbutrin  [Bupropion ] Palpitations    Immunization History  Administered Date(s) Administered   Fluad Quad(high Dose 65+) 04/17/2019, 04/30/2020   Fluad Trivalent(High Dose 65+) 05/23/2023   H1N1 07/24/2008   Influenza Split 07/17/2011, 09/27/2011, 04/29/2012   Influenza Whole 07/20/2004, 07/04/2007, 05/07/2008, 05/19/2009, 05/17/2010   Influenza, High Dose Seasonal PF 05/30/2017, 05/21/2018   Influenza,inj,Quad PF,6+ Mos 05/20/2013, 05/06/2014, 05/17/2015, 05/15/2016, 04/27/2021   Influenza-Unspecified 05/07/2018   PFIZER(Purple Top)SARS-COV-2 Vaccination 09/25/2019, 10/16/2019, 06/04/2020, 01/04/2021, 08/06/2021   Pfizer Covid-19 Vaccine Bivalent Booster 56yrs & up 05/07/2022   Pneumococcal Conjugate-13 11/22/2015, 08/06/2021   Pneumococcal Polysaccharide-23 05/20/2002, 05/07/2008, 06/25/2014   Respiratory Syncytial Virus Vaccine ,Recomb Aduvanted(Arexvy ) 06/26/2022   Td 09/07/2009   Zoster, Live 09/21/2011    Family History  Problem Relation Age of Onset   Diabetes Mother    Alzheimer's disease Mother    Hypertension Mother    Obesity Mother    Arthritis Mother    Heart disease Father    Seizures Sister    Allergies Sister    Heart disease Sister    Early death Sister    Other Brother        Thyroid  problem 10/2016   Hearing loss Brother    Vision loss Brother    Heart disease Brother    Prostate cancer Brother 60       same brother as throat cancer   Throat cancer Brother        dx in his 19s; also a smoker   Arthritis Brother    Hypertension Brother    Cancer Maternal Grandmother 53       colon cancer or abdominal cancer   Lung cancer Maternal Grandfather 78   Heart disease Son        Cardiac Arrest 07/2016   Breast cancer Maternal Aunt 55   Colon cancer Maternal Aunt 61       same  sister as breast at 28   Breast cancer Maternal Aunt        dx in her 67s   Lung cancer Maternal Uncle    Pancreatic cancer Cousin 61       maternal first cousin   Breast cancer Cousin        paternal first cousin twice removed died in her 30s   Anxiety disorder Sister    Arthritis Sister    Depression Sister    Hypertension Sister    Stroke Neg Hx      Current Outpatient Medications:    albuterol  (VENTOLIN  HFA) 108 (90 Base) MCG/ACT inhaler, INHALE 2 PUFFS INTO THE LUNGS EVERY 6 (SIX) HOURS AS NEEDED FOR WHEEZING OR SHORTNESS OF BREATH., Disp: 3 each, Rfl: 5   azelastine  (ASTELIN ) 0.1 % nasal spray, Place 1 spray into both nostrils 2 (two) times daily. Use in each nostril as directed, Disp: 90 mL, Rfl: 3   budesonide -formoterol  (  SYMBICORT ) 80-4.5 MCG/ACT inhaler, Inhale 1 puff into the lungs 2 (two) times daily., Disp: 3 each, Rfl: 4   Cholecalciferol  (DIALYVITE VITAMIN D  5000) 125 MCG (5000 UT) capsule, Take 5,000 Units by mouth daily., Disp: , Rfl:    cloBAZam  (ONFI ) 10 MG tablet, Take 1 tablet by mouth twice daily, Disp: 180 tablet, Rfl: 1   diltiazem  (CARDIZEM  CD) 120 MG 24 hr capsule, TAKE 1 CAPSULE EVERY DAY, Disp: 90 capsule, Rfl: 2   ELIQUIS  5 MG TABS tablet, TAKE 1 TABLET TWICE DAILY, Disp: 180 tablet, Rfl: 3   empagliflozin  (JARDIANCE ) 10 MG TABS tablet, TAKE 1 TABLET EVERY DAY (NEED MD APPOINTMENT FOR REFILLS), Disp: 15 tablet, Rfl: 0   famotidine  (PEPCID ) 40 MG tablet, TAKE 1 TABLET AT BEDTIME (NEED MD APPOINTMENT FOR REFILLS), Disp: 90 tablet, Rfl: 3   fexofenadine (ALLEGRA) 180 MG tablet, Take 180 mg by mouth daily as needed for allergies (alternates with zyrtec sometimes)., Disp: , Rfl:    fluticasone  (FLONASE ) 50 MCG/ACT nasal spray, Place 2 sprays into both nostrils daily as needed for allergies or rhinitis., Disp: 48 g, Rfl: 3   FOLIC ACID  PO, Take 1 tablet by mouth daily., Disp: , Rfl:    furosemide  (LASIX ) 20 MG tablet, TAKE 2 TABLETS (40 MG) 3 DAYS PER WEEK AND TAKE  1 TABLET (20 MG) THE OTHER DAYS OF THE WEEK, Disp: 130 tablet, Rfl: 2   gabapentin  (NEURONTIN ) 300 MG capsule, 1 at bedtime x 7 days, then 1 twice daily x 7 days, then 1 three times daily, Disp: 90 capsule, Rfl: 1   hydroquinone 4 % cream, Apply 1 Application topically at bedtime., Disp: , Rfl:    hydroxypropyl methylcellulose / hypromellose (ISOPTO TEARS / GONIOVISC) 2.5 % ophthalmic solution, Place 1 drop into both eyes at bedtime., Disp: , Rfl:    latanoprost  (XALATAN ) 0.005 % ophthalmic solution, Place 1 drop into both eyes at bedtime., Disp: , Rfl:    levocetirizine (XYZAL ) 5 MG tablet, Take 5 mg by mouth daily at 12 noon., Disp: , Rfl:    levothyroxine  (SYNTHROID ) 50 MCG tablet, TAKE 1 TABLET EVERY DAY, Disp: 90 tablet, Rfl: 3   magnesium  oxide (MAG-OX) 400 (240 Mg) MG tablet, TAKE 1 TABLET TWICE DAILY, Disp: 180 tablet, Rfl: 3   montelukast  (SINGULAIR ) 10 MG tablet, TAKE 1 TABLET AT BEDTIME, Disp: 90 tablet, Rfl: 3   Multiple Vitamins-Minerals (MULTIVITAMIN PO), Take 1 tablet by mouth daily., Disp: , Rfl:    pantoprazole  (PROTONIX ) 40 MG tablet, TAKE 1 TABLET EVERY DAY, Disp: 90 tablet, Rfl: 0   Pirfenidone  267 MG TABS, Take 3 tablets (801 mg total) by mouth 3 (three) times daily with meals., Disp: 270 tablet, Rfl: 4   polyethylene glycol powder (GLYCOLAX /MIRALAX ) powder, Take 17 grams by mouth daily, Disp: 1080 g, Rfl: 0   potassium chloride  (KLOR-CON  M) 10 MEQ tablet, TAKE 1/2 TABLET EVERY DAY, Disp: 45 tablet, Rfl: 3   REPATHA  SURECLICK 140 MG/ML SOAJ, INJECT 140 MG INTO THE SKIN EVERY 14 DAYS, Disp: 6 mL, Rfl: 0   Respiratory Therapy Supplies (FLUTTER) DEVI, 1 Device by Does not apply route as needed., Disp: 1 each, Rfl: 0   tiZANidine  (ZANAFLEX ) 4 MG tablet, TAKE 1/2 TO 1 TABLET (2 - 4 MG) TWICE A DAY, Disp: 180 tablet, Rfl: 3   traZODone  (DESYREL ) 100 MG tablet, TAKE 1 TO 1 AND 1/2 TABLETS AT BEDTIME, Disp: 135 tablet, Rfl: 3   triamcinolone  cream (KENALOG ) 0.1 %, Apply topically 2  (  two) times daily., Disp: , Rfl:    benzonatate  (TESSALON ) 200 MG capsule, Take 1 capsule (200 mg total) by mouth 3 (three) times daily as needed for cough., Disp: 90 capsule, Rfl: 2   colchicine  0.6 MG tablet, Take 1 tablet TID until GI upset or flare subsides, then for maintenance, take 1 tablet daily., Disp: 90 tablet, Rfl: 3   doxycycline  (VIBRA -TABS) 100 MG tablet, Take 1 tablet (100 mg total) by mouth 2 (two) times daily., Disp: 20 tablet, Rfl: 0   oxyCODONE  (OXY IR/ROXICODONE ) 5 MG immediate release tablet, Take 0.5-1 tablets (2.5-5 mg total) by mouth 2 (two) times daily as needed for severe pain (pain score 7-10)., Disp: 45 tablet, Rfl: 0   sodium chloride  HYPERTONIC 3 % nebulizer solution, Take by nebulization 2 (two) times daily as needed for cough. 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 (Patient not taking: Reported on 02/18/2024), Disp: 750 mL, Rfl: 12  Current Facility-Administered Medications:    lidocaine  (XYLOCAINE ) 1 % (with pres) injection 1 mL, 1 mL, Other, Once, Lovorn, Megan, MD   lidocaine  (XYLOCAINE ) 1 % (with pres) injection 3 mL, 3 mL, Other, Once, Lovorn, Megan, MD      Objective:   Vitals:   02/18/24 1511  BP: 108/68  Pulse: 70  SpO2: 95%  Weight: 207 lb (93.9 kg)  Height: 5' 6.5 (1.689 m)    Estimated body mass index is 32.91 kg/m as calculated from the following:   Height as of this encounter: 5' 6.5 (1.689 m).   Weight as of this encounter: 207 lb (93.9 kg).  @WEIGHTCHANGE @  Filed Weights   02/18/24 1511  Weight: 207 lb (93.9 kg)     Physical Exam   General: No distress. Looks well O2 at rest: no Cane present: no Sitting in wheel chair: no Frail: noo Obese: no Neuro: Alert and Oriented x 3. GCS 15. Speech normal Psych: Pleasant Resp:  Barrel Chest - no.  Wheeze - no, Crackles - YES BSAE, No overt respiratory distress CVS: Normal heart sounds. Murmurs - no Ext: Stigmata of Connective Tissue Disease - no. RT TOE BLACK HEENT: Normal upper airway. PEERL +.  No post nasal drip        Assessment:       ICD-10-CM   1. IPF (idiopathic pulmonary fibrosis) (HCC)  J84.112 CBC w/Diff    B Nat Peptide    D-Dimer, Quantitative    Hepatic function panel    CT Chest High Resolution    Ambulatory Referral to Neuro Rehab    CBC w/Diff    B Nat Peptide    D-Dimer, Quantitative    Hepatic function panel    2. Medication monitoring encounter  Z51.81 CBC w/Diff    B Nat Peptide    D-Dimer, Quantitative    Hepatic function panel    CT Chest High Resolution    Ambulatory Referral to Neuro Rehab    CBC w/Diff    B Nat Peptide    D-Dimer, Quantitative    Hepatic function panel    3. OSA (obstructive sleep apnea)  G47.33 CBC w/Diff    B Nat Peptide    D-Dimer, Quantitative    Hepatic function panel    CT Chest High Resolution    Ambulatory Referral to Neuro Rehab    CBC w/Diff    B Nat Peptide    D-Dimer, Quantitative    Hepatic function panel    4. DOE (dyspnea on exertion)  R06.09     5. Chronic cough  R05.3 CBC w/Diff    B Nat Peptide    D-Dimer, Quantitative    Hepatic function panel    CT Chest High Resolution    Ambulatory Referral to Neuro Rehab    CBC w/Diff    B Nat Peptide    D-Dimer, Quantitative    Hepatic function panel    6. House dust mite allergy  Z91.09 CBC w/Diff    B Nat Peptide    D-Dimer, Quantitative    Hepatic function panel    CT Chest High Resolution    Ambulatory Referral to Neuro Rehab    CBC w/Diff    B Nat Peptide    D-Dimer, Quantitative    Hepatic function panel    7. Chronic throat clearing  R09.89 CBC w/Diff    B Nat Peptide    D-Dimer, Quantitative    Hepatic function panel    CT Chest High Resolution    Ambulatory Referral to Neuro Rehab    CBC w/Diff    B Nat Peptide    D-Dimer, Quantitative    Hepatic function panel    8. History of pulmonary embolism  Z86.711 CBC w/Diff    B Nat Peptide    D-Dimer, Quantitative    Hepatic function panel    CT Chest High Resolution     Ambulatory Referral to Neuro Rehab    CBC w/Diff    B Nat Peptide    D-Dimer, Quantitative    Hepatic function panel    9. Black toe (HCC)  I96 CBC w/Diff    B Nat Peptide    D-Dimer, Quantitative    Hepatic function panel    CT Chest High Resolution    Ambulatory Referral to Neuro Rehab    CBC w/Diff    B Nat Peptide    D-Dimer, Quantitative    Hepatic function panel    10. 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)  I42.9 B Nat Peptide    B Nat Peptide         Plan:     Patient Instructions  IPF (idiopathic pulmonary fibrosis) (HCC) Medication monitoring encounter History of abnormal liver function History of chronic systolic dysfunction   -Clinically IPF likely progressive because of drop in DLCO but need to rule out pulmonary hypertension [pulmonary hypertension was absent in echo 2024] -Dizziness has resolved after Pirfenidone  (Esbriet ) rechallenge in the fall 2024  -Currently tolerating Esbriet  well without any side effects although liver function test was elevated in January 2025     Plan -Check liver function test today and INR 3 02/18/2024 -Check BNP 02/18/2024 - monitor o2 levels - Do high-resolution CT chest supine and prone in to 2-3 months - continue esbreit  - if progression on CT, consider clinical trials in fall 2025 and potential new approved anti fibrotic in Jan 2026  History of 2019 novel coronavirus disease (COVID-19) History of pulmonary embolism April 02, 2023 Throat burning with Pradaxa   - Doing well on Eliquis    Plan -Continue Eliquis  full doe - Check D-dimer for monitoring purposes - List PRADAXA  as allergy (throat burning)  Chronic cough 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 History of asthma   - 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  Plan -Check CBC with differential to see if he will be a candidate for biologic therapy against eosinophils --Continue allergy/asthma treatment of  Symbicort  and Singulair , Flonase  and Allegra as before - Continue house dust mite control as you have been doing -  Referral voice rehab - Start gabapentin  300 mg once daily at night for 7 days followed by 300 mg twice daily for next 7 days and 300 mg 3 times daily to cotninue  - if it makes you groggy call us    #Black right third toe  Plan  - Okay to have foot surgery and biopsy  OSA (obstructive sleep apnea)  - Plan - Under the care of  sleep doctor Dr. MALVA   Followup  -- 2-68months to  to see Dr. Geronimo in a 30-minute visit  -Symptom score and exercise hypoxemia test at follow-up  - 30-minute visit after CT chest       FOLLOWUP Return in about 10 weeks (around 04/28/2024) for with Dr Geronimo, Face to Face Visit, after HRCT chest.     ( Level 05 visit E&M 2024: Estb >= 40 min visit type: on-site physical face to visit  in total care time and counseling or/and coordination of care by this undersigned MD - Dr Dorethia Geronimo. This includes one or more of the following on this same day 02/18/2024: pre-charting, chart review, note writing, documentation discussion of test results, diagnostic or treatment recommendations, prognosis, risks and benefits of management options, instructions, education, compliance or risk-factor reduction. It excludes time spent by the CMA or office staff in the care of the patient. Actual time 45 min)   SIGNATURE    Dr. Dorethia Geronimo, M.D., F.C.C.P,  Pulmonary and Critical Care Medicine Staff Physician, [name] Director - Interstitial Lung Disease  Program  Pulmonary Fibrosis [facility] at [facility] [place], [place], [address]  Pager: [phone], If no answer or between  15:00h - 7:00h: call [phone] Telephone: [phone]  5:57 PM 02/18/2024

## 2024-02-19 ENCOUNTER — Other Ambulatory Visit: Payer: Self-pay | Admitting: Pulmonary Disease

## 2024-02-19 LAB — CBC WITH DIFFERENTIAL/PLATELET
Basophils Absolute: 0 10*3/uL (ref 0.0–0.1)
Basophils Relative: 0.8 % (ref 0.0–3.0)
Eosinophils Absolute: 0.3 10*3/uL (ref 0.0–0.7)
Eosinophils Relative: 7.8 % — ABNORMAL HIGH (ref 0.0–5.0)
HCT: 44 % (ref 36.0–46.0)
Hemoglobin: 14.2 g/dL (ref 12.0–15.0)
Lymphocytes Relative: 18.2 % (ref 12.0–46.0)
Lymphs Abs: 0.8 10*3/uL (ref 0.7–4.0)
MCHC: 32.4 g/dL (ref 30.0–36.0)
MCV: 91.9 fl (ref 78.0–100.0)
Monocytes Absolute: 0.4 10*3/uL (ref 0.1–1.0)
Monocytes Relative: 8.8 % (ref 3.0–12.0)
Neutro Abs: 2.8 10*3/uL (ref 1.4–7.7)
Neutrophils Relative %: 64.4 % (ref 43.0–77.0)
Platelets: 205 10*3/uL (ref 150.0–400.0)
RBC: 4.78 Mil/uL (ref 3.87–5.11)
RDW: 13.4 % (ref 11.5–15.5)
WBC: 4.3 10*3/uL (ref 4.0–10.5)

## 2024-02-19 LAB — HEPATIC FUNCTION PANEL
ALT: 43 U/L — ABNORMAL HIGH (ref 0–35)
AST: 47 U/L — ABNORMAL HIGH (ref 0–37)
Albumin: 4.2 g/dL (ref 3.5–5.2)
Alkaline Phosphatase: 152 U/L — ABNORMAL HIGH (ref 39–117)
Bilirubin, Direct: 0.1 mg/dL (ref 0.0–0.3)
Total Bilirubin: 0.3 mg/dL (ref 0.2–1.2)
Total Protein: 7.5 g/dL (ref 6.0–8.3)

## 2024-02-19 LAB — BRAIN NATRIURETIC PEPTIDE: Pro B Natriuretic peptide (BNP): 46 pg/mL (ref 0.0–100.0)

## 2024-02-19 NOTE — Telephone Encounter (Signed)
 Copy of risk assessment done 02/18/24 was faxed to [facility].

## 2024-02-25 ENCOUNTER — Other Ambulatory Visit: Payer: Self-pay | Admitting: Orthopedic Surgery

## 2024-02-25 NOTE — Progress Notes (Signed)
 Patient's chart reviewed and per [facility] Guidelines, we do not do patients that have ICD's. Meagan at [facility] office notified.

## 2024-02-28 ENCOUNTER — Other Ambulatory Visit (HOSPITAL_COMMUNITY): Payer: Self-pay

## 2024-03-02 ENCOUNTER — Ambulatory Visit: Payer: Self-pay | Admitting: Cardiology

## 2024-03-02 ENCOUNTER — Ambulatory Visit

## 2024-03-02 DIAGNOSIS — I428 Other cardiomyopathies: Secondary | ICD-10-CM

## 2024-03-02 LAB — CUP PACEART REMOTE DEVICE CHECK
Battery Remaining Longevity: 3 mo — CL
Battery Remaining Percentage: 2 %
Brady Statistic RV Percent Paced: 0 %
Date Time Interrogation Session: 20250714102400
HighPow Impedance: 70 Ohm
Implantable Lead Connection Status: 753985
Implantable Lead Implant Date: 20050603
Implantable Lead Location: 753860
Implantable Lead Model: 185
Implantable Lead Serial Number: 116340
Implantable Pulse Generator Implant Date: 20110505
Lead Channel Impedance Value: 564 Ohm
Lead Channel Pacing Threshold Amplitude: 0.8 V
Lead Channel Pacing Threshold Pulse Width: 0.4 ms
Lead Channel Setting Pacing Amplitude: 2.4 V
Lead Channel Setting Pacing Pulse Width: 0.4 ms
Lead Channel Setting Sensing Sensitivity: 0.4 mV
Pulse Gen Serial Number: 266301
Zone Setting Status: 755011

## 2024-03-06 ENCOUNTER — Encounter: Payer: Self-pay | Admitting: Physical Medicine and Rehabilitation

## 2024-03-06 ENCOUNTER — Encounter: Attending: Physical Medicine and Rehabilitation | Admitting: Physical Medicine and Rehabilitation

## 2024-03-06 VITALS — BP 126/83 | HR 69 | Ht 66.5 in | Wt 205.0 lb

## 2024-03-06 DIAGNOSIS — M797 Fibromyalgia: Secondary | ICD-10-CM | POA: Diagnosis present

## 2024-03-06 DIAGNOSIS — M7918 Myalgia, other site: Secondary | ICD-10-CM | POA: Insufficient documentation

## 2024-03-06 DIAGNOSIS — M25512 Pain in left shoulder: Secondary | ICD-10-CM | POA: Diagnosis not present

## 2024-03-06 DIAGNOSIS — G8929 Other chronic pain: Secondary | ICD-10-CM | POA: Insufficient documentation

## 2024-03-06 DIAGNOSIS — G894 Chronic pain syndrome: Secondary | ICD-10-CM | POA: Diagnosis not present

## 2024-03-06 MED ORDER — TRIAMCINOLONE ACETONIDE 40 MG/ML IJ SUSP
40.0000 mg | Freq: Once | INTRAMUSCULAR | Status: AC
  Administered 2024-03-06: 40 mg via INTRA_ARTICULAR

## 2024-03-06 MED ORDER — PREGABALIN 25 MG PO CAPS
25.0000 mg | ORAL_CAPSULE | Freq: Three times a day (TID) | ORAL | 5 refills | Status: AC
Start: 2024-03-06 — End: ?

## 2024-03-06 MED ORDER — LIDOCAINE HCL 1 % IJ SOLN
4.0000 mL | Freq: Once | INTRAMUSCULAR | Status: AC
Start: 2024-03-06 — End: 2024-03-06
  Administered 2024-03-06: 4 mL

## 2024-03-06 NOTE — Progress Notes (Signed)
 Patient is a 73 yr old R handed female with hx of  Bronchiesctasis, CHF, prediabetes- with A1c of 5.6- as of 02/15/21- - pt thinks still true- - not on blood thinners, has a defibrillator, GERD, and fibromyalgia issues- dx'd 10 years ago.  Here for f/u on chronic pain and trigger oint injections. Last got subacromial shoulder injections B/L on 03/29/21.  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  Here for f/u on chronic pain.     Doing good-   Pulmonary doctor gave her Gabapentin - for coughing all the time-  makes her SO sleepy- sending to a neurorehab clinic= thinking coughing is neurogenic cough? Allergic to dustmites. Sets off with dust, scents.  Even when just take the Gabapentin  at night.   Only takes Oxycodone  when really needs it and 1/2 dose.   Taking Oxy 1x/week if that- maybe 1x/every 2 weeks,  Takes only when really needs it.      Exam: Intermittent soft cough; awake, alert, appropriate, wearing mask, NAD Empty can test (+) for impingement.    Plan: Stop the gabapentin !  2. Will try Lyrica/Pregabalin-  25 mg 1-2x/day-  take 1 in evening-  and then can increase weekly IF NEED BE- to 3 tabs/day/max.   3. Patient here for trigger point injections for  Consent done and on chart.  Cleaned areas with alcohol  and injected using a 27 gauge 1.5 inch needle  Injected  1.5cc- wasted 1.5cc Using 1% Lidocaine  with no EPI  Upper traps L only- x2 Levators- L only Posterior scalenes Middle scalenes Splenius Capitus Pectoralis Major Rhomboids L only Infraspinatus Teres Major/minor Thoracic paraspinals Lumbar paraspinals Other injections-     There was no bleeding or complications.  Patient was advised to drink a lot of water  on day after injections to flush system Will have increased soreness for 12-48 hours after injections.  Can use Lidocaine  patches the day AFTER injections Can use theracane on day of injections in places didn't  inject Can use heating pad 4-6 hours AFTER injections  4. For L shoulder injection-  steroid injection was performed at  L RTC, posterior shoulder- using 1% plain Lidocaine  and 40mg  /1cc of Kenalog . This was well tolerated.  Cleaned with betadine  x3 and allowed to dry- then alcohol  then injected using 27 gauge 1.5 inch needle- no bleeding or complications.    F/U in 3 months for steroid injections of B/L RTC posterior shoulder.   Lidocaine  will kick in 15 minutes- and wear off tonight- the steroid will kick in tomorrow within 24 hours and take up to 72 hours to fully kick in.  5.  F/U in 3months for B/L posterior shoulder injections and trp injection and f/u on pain.    6. No refills of Oxycodone  needed- continue meds- has 26 pills left- last wrote for it 3/28 for 45 pills- so doing great on taking rarely.   7. Last UDS 3/25.    I spent a total of    minutes on total care today- >50% coordination of care- due to

## 2024-03-06 NOTE — Patient Instructions (Signed)
 Plan: Stop the gabapentin !  2. Will try Lyrica/Pregabalin-  25 mg 1-2x/day-  take 1 in evening-  and then can increase weekly IF NEED BE- to 3 tabs/day/max.   3. Patient here for trigger point injections for  Consent done and on chart.  Cleaned areas with alcohol  and injected using a 27 gauge 1.5 inch needle  Injected  1.5cc- wasted 1.5cc Using 1% Lidocaine  with no EPI  Upper traps L only- x2 Levators- L only Posterior scalenes Middle scalenes Splenius Capitus Pectoralis Major Rhomboids L only Infraspinatus Teres Major/minor Thoracic paraspinals Lumbar paraspinals Other injections-     There was no bleeding or complications.  Patient was advised to drink a lot of water  on day after injections to flush system Will have increased soreness for 12-48 hours after injections.  Can use Lidocaine  patches the day AFTER injections Can use theracane on day of injections in places didn't inject Can use heating pad 4-6 hours AFTER injections  4. For L shoulder injection-  steroid injection was performed at  L RTC, posterior shoulder- using 1% plain Lidocaine  and 40mg  /1cc of Kenalog . This was well tolerated.  Cleaned with betadine  x3 and allowed to dry- then alcohol  then injected using 27 gauge 1.5 inch needle- no bleeding or complications.    F/U in 3 months for steroid injections of B/L RTC posterior shoulder.   Lidocaine  will kick in 15 minutes- and wear off tonight- the steroid will kick in tomorrow within 24 hours and take up to 72 hours to fully kick in.  5.  F/U in 3months for B/L posterior shoulder injections and trp injection and f/u on pain.    6. No refills of Oxycodone  needed- continue meds- has 26 pills left- last wrote for it 3/28 for 45 pills- so doing great on taking rarely.   7. Last UDS 3/25.

## 2024-03-07 LAB — LIPID PANEL
Chol/HDL Ratio: 3.1 ratio (ref 0.0–4.4)
Cholesterol, Total: 197 mg/dL (ref 100–199)
HDL: 63 mg/dL (ref >39)
LDL Chol Calc (NIH): 119 mg/dL — ABNORMAL HIGH (ref 0–99)
Triglycerides: 83 mg/dL (ref 0–149)
VLDL Cholesterol Cal: 15 mg/dL (ref 5–40)

## 2024-03-13 ENCOUNTER — Other Ambulatory Visit (INDEPENDENT_AMBULATORY_CARE_PROVIDER_SITE_OTHER)

## 2024-03-13 ENCOUNTER — Ambulatory Visit: Admitting: Internal Medicine

## 2024-03-13 ENCOUNTER — Ambulatory Visit: Payer: Self-pay | Admitting: Internal Medicine

## 2024-03-13 ENCOUNTER — Encounter: Payer: Self-pay | Admitting: Internal Medicine

## 2024-03-13 VITALS — BP 134/70 | HR 78 | Ht 66.5 in | Wt 206.2 lb

## 2024-03-13 DIAGNOSIS — Z860100 Personal history of colon polyps, unspecified: Secondary | ICD-10-CM | POA: Diagnosis not present

## 2024-03-13 DIAGNOSIS — K429 Umbilical hernia without obstruction or gangrene: Secondary | ICD-10-CM | POA: Diagnosis not present

## 2024-03-13 DIAGNOSIS — R748 Abnormal levels of other serum enzymes: Secondary | ICD-10-CM | POA: Diagnosis not present

## 2024-03-13 DIAGNOSIS — R7989 Other specified abnormal findings of blood chemistry: Secondary | ICD-10-CM

## 2024-03-13 LAB — GAMMA GT: GGT: 65 U/L — ABNORMAL HIGH (ref 7–51)

## 2024-03-13 LAB — CK: Total CK: 308 U/L — ABNORMAL HIGH (ref 17–177)

## 2024-03-13 NOTE — Progress Notes (Signed)
 Subjective:    Patient ID: [name], female    DOB: 22-Feb-1951, 73 y.o.   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 difficile infection, elevation in serum transaminases but also CK who is here for follow-up.  She was seen about a year ago by Dr. Avram.  She is here alone today.  She also has a history of V. tach with ICD in place, bronchiectasis, hypertension, sleep apnea and a fairly recently discovered and symptomatic ventral hernia containing transverse colon.  She experienced an episode of severe abdominal pain localized to the area of her known infraumbilical midline hernia, which contains a loop of transverse colon. The pain was significant enough to require oxycodone  for relief.  This occurred during the night and woke her from sleep.  She reports that the hernia now protrudes without her needing to push her stomach out.  She has a history of diverticulosis, with her last colonoscopy in May 2019 showing three polyps, the largest being 5 mm, and extensive diverticulosis from the ascending to sigmoid colon. The polyps were tubular adenomas. She reports recent episodes of diarrhea with foul odor, although she has not had diarrhea in the past couple of weeks. She continues to experience significant gas and bloating.  She has a history of elevated liver enzymes and CK levels. Recent lab work from February 18, 2024, showed AST at 47, ALT at 43, and CK slightly elevated at 215. She was previously taken off some medications and started on Repatha . She experiences cramps in her leg and foot.  She has a history of idiopathic pulmonary fibrosis, which complicates potential surgical interventions due to concerns about anesthesia and ventilation.  She saw Dr. Curvin at [facility] who recommended ventral  hernia repair but needed pulmonary clearance first which I do not think has happened.  Review of Systems As per HPI, otherwise negative  Current Medications, Allergies, Past Medical History, Past Surgical History, Family History and Social History were reviewed in [employer] record.    Objective:   Physical Exam BP 134/70   Pulse 78   Ht 5' 6.5 (1.689 m)   Wt 206 lb 4 oz (93.6 kg)   LMP  (LMP Unknown)   SpO2 95%   BMI 32.79 kg/m  Gen: awake, alert, NAD HEENT: anicteric  Abd: soft, infraumbilical hernia containing a loop of intestine which is reducible with the patient supine, not overly tender, not distended, +BS throughout Ext: no c/c/e Neuro: nonfocal  LABS AST: 47 (02/18/2024) ALT: 43 (02/18/2024) Alkaline phosphatase: 152 (02/18/2024) Total bilirubin: 0.3 (02/18/2024) Hemoglobin: 14.2 (02/18/2024) MCV: 91.9 (02/18/2024) WBC: 4.3 (02/18/2024) Platelet count: 205 (02/18/2024) CRP: 3.6 (02/18/2024) CK: 215 (Summer 2024) INR: 1.0 (Summer 2024)  RADIOLOGY CT scan abdomen and pelvis with contrast: No acute findings, no evidence of appendicitis, infraumbilical midline hernia containing a loop of transverse colon, no evidence of incarceration or strangulation, extensive colonic diverticulosis without evidence of diverticulitis, no focal liver abnormalities, no gallstones, no gallbladder wall thickening or biliary ductal dilatation (09/07/2023)  DIAGNOSTIC Colonoscopy: Three polyps removed, largest 5 mm, diverticulosis from ascending to sigmoid colon, polyps found to be tubular adenomas (01/06/2018)      Assessment & Plan:  73 year old female with a history of GERD and LPR, 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 difficile infection, elevation in serum transaminases but also CK who is here for follow-up.  Umbilical hernia with colon involvement CT scan  shows infraumbilical hernia with transverse colon involvement, reducible, no incarceration or strangulation today. Risk of bowel incarceration and complications for colonoscopy. Surgical repair considered, pending pulmonary evaluation due to idiopathic pulmonary fibrosis. - Consult Dr. Geronimo for pulmonary clearance for hernia repair. - Delay colonoscopy until hernia repair decision. - Advise ER visit if severe hernia pain recurs and persists over 30 minutes. - Consider binder use during activity if no hernia repair or symptoms develop with activity.  Colonic diverticulosis Extensive diverticulosis without diverticulitis. May cause irregular bowel habits and gas. Recent symptoms likely post-infectious, expected to normalize without probiotics. - Monitor symptoms, allow post-infectious recovery. - Advise to call if diarrhea returns.  Adenomatous polyps Previous colonoscopy showed tubular adenomas. Hernia complicates follow-up colonoscopy due to increased procedural risk. - Delay colonoscopy until hernia repair decision.  Idiopathic pulmonary fibrosis Poses anesthesia risk for surgery. Pulmonary clearance required before hernia repair. - Consult Dr. Geronimo for pulmonary clearance for hernia repair.  Elevated CK levels and mild elevation in AST/ALT Slightly elevated CK, possible cause of elevated liver enzymes Recent labs missing CK check. - Order CK level test today. -Check GGT and 5 nucleotidase - Forward CK results to primary care and cardiologist if elevated. -My suspicion is her AST and ALT elevations which are Slyter not of liver origin -Liver normal by imaging; no evidence for portal hypertension  40 minutes total spent today including patient facing time, coordination of care, reviewing medical history/procedures/pertinent radiology studies, and documentation of the encounter.

## 2024-03-13 NOTE — Patient Instructions (Signed)
 Your provider has requested that you go to the basement level for lab work before leaving today. Press B on the elevator. The lab is located at the first door on the left as you exit the elevator.  Please contact Dr. Reeves office regarding your Eliquis  clearance to have your umbilical hernia surgery with Dr. Curvin.  Go directly to the ER department if you experience any more abdominal pain.  _______________________________________________________  If your blood pressure at your visit was 140/90 or greater, please contact your primary care physician to follow up on this.  _______________________________________________________  If you are age 75 or older, your body mass index should be between 23-30. Your Body mass index is 32.79 kg/m. If this is out of the aforementioned range listed, please consider follow up with your Primary Care Provider.  If you are age 81 or younger, your body mass index should be between 19-25. Your Body mass index is 32.79 kg/m. If this is out of the aformentioned range listed, please consider follow up with your Primary Care Provider.   ________________________________________________________  The [facility] providers would like to encourage you to use MYCHART to communicate with providers for non-urgent requests or questions.  Due to long hold times on the telephone, sending your provider a message by [facility] may be a faster and more efficient way to get a response.  Please allow 48 business hours for a response.  Please remember that this is for non-urgent requests.  _______________________________________________________  [facility] is using a team-based approach to care.  Your team is made up of your doctor and two to three APPS. Our APPS (Nurse Practitioners and Physician Assistants) work with your physician to ensure care continuity for you. They are fully qualified to address your health concerns and develop a treatment plan. They  communicate directly with your gastroenterologist to care for you. Seeing the Advanced Practice Practitioners on your physician's team can help you by facilitating care more promptly, often allowing for earlier appointments, access to diagnostic testing, procedures, and other specialty referrals.

## 2024-03-16 ENCOUNTER — Other Ambulatory Visit: Payer: Self-pay

## 2024-03-16 NOTE — Progress Notes (Signed)
 Specialty Pharmacy Refill Coordination Note  [name] is a 73 y.o. female contacted today regarding refills of specialty medication(s) Pirfenidone    Patient requested Delivery   Delivery date: 03/26/24   Verified address: [address]   Medication will be filled on 08.06.25.

## 2024-03-18 ENCOUNTER — Other Ambulatory Visit: Payer: Self-pay | Admitting: Internal Medicine

## 2024-03-19 ENCOUNTER — Other Ambulatory Visit: Payer: Self-pay

## 2024-03-20 LAB — NUCLEOTIDASE, 5', BLOOD: 5-Nucleotidase: 4 U/L (ref 0–10)

## 2024-03-25 NOTE — Progress Notes (Signed)
 Remote ICD transmission.

## 2024-03-31 ENCOUNTER — Ambulatory Visit (HOSPITAL_BASED_OUTPATIENT_CLINIC_OR_DEPARTMENT_OTHER): Admitting: Family

## 2024-03-31 ENCOUNTER — Ambulatory Visit: Payer: Self-pay | Admitting: Family Medicine

## 2024-03-31 ENCOUNTER — Encounter: Payer: Self-pay | Admitting: Cardiology

## 2024-03-31 NOTE — Progress Notes (Signed)
 PERIOPERATIVE PRESCRIPTION FOR IMPLANTED CARDIAC DEVICE PROGRAMMING  Patient Information: Name:  [name]  DOB:  05/14/1951  MRN:  [id number]    Planned Procedure:  Right third toe excisional biopsy  Surgeon:  Dr. Norleen Armor  Date of Procedure:  04/02/2024  Cautery will be used.  Position during surgery:  Supine   Please send documentation back to:  [facility] (Fax # [phone])   Device Information:  Clinic EP Physician:  Dr. Ole Holts   Device Type:  Defibrillator Manufacturer and Phone #:  [employer]: [phone] Pacemaker Dependent?:  No. Date of Last Device Check:  03/02/2024 Normal Device Function?:  Yes.    Electrophysiologist's Recommendations:  Have magnet available. Provide continuous ECG monitoring when magnet is used or reprogramming is to be performed.  Procedure should not interfere with device function.  No device programming or magnet placement needed.  Per Device Clinic Standing Orders, Adriana ONEIDA Shutter, RN  11:10 AM 03/31/2024

## 2024-04-01 ENCOUNTER — Encounter (HOSPITAL_COMMUNITY): Payer: Self-pay | Admitting: Orthopedic Surgery

## 2024-04-01 ENCOUNTER — Other Ambulatory Visit: Payer: Self-pay

## 2024-04-01 NOTE — Progress Notes (Signed)
 Anesthesia Chart Review: [name]  Case: [id number] Date/Time: 04/02/24 0716   Procedure: EXCISION, EXOSTOSIS (Right)   Anesthesia type: Monitor Anesthesia Care   Diagnosis: Other cyst of bone, unspecified ankle and foot [M85.679]   Pre-op diagnosis: Bone cyst of foot   [facility]: [facility] ROOM 12 / [facility]   Surgeons: Kit Rush, MD       DISCUSSION: Patient is a 73 year old female scheduled for the above procedure.  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     Last primary cardiology visit was on 12/30/23 with Vannie Mora, NP. 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 No CAD by cath in 2013 (2013 LVEF 35-40% ? 2022 LVEF 50 to 55% ? 03/2023 LVEF 40-45% in setting of PE). ICD placed in 2005, last generator change in 2011.  ZIO 11/2019 due to palpitations with predominantly normal sinus rhythm, palpitations mostly attributed with infrequent PVC, 6 nonsustained VT episodes longest 7 beats.  Myoview  03/2020 low risk study with normal perfusion. TTE in 03/2023 during PE admission showed LVEF 40-45%, LV global hypokinesis, grade 1 DD, RV normal mild LAE, trivial MR. PVCs managed on Diltiazem . TTE ordered to reassess LVEF. TTE 02/04/2024 showed LVEF  45 to 50%, LV global hypokinesis, mild concentric LVH, grade 1 diastolic dysfunction, normal RV systolic function, mildly elevated PASP, trivial MR.   She had EP follow-up with Lesia, M. Prentice, PA-C on 01/15/24. Stable on appropriate medical therapy. Normal ICD function. Remaining device longevity < 3 months. Occasional NSVT/VT, asymptomatic. 5 month APP follow-up planned to discuss scheduling generator change. On 02/10/24, he added preoperative risk assessment stating, No concerns from a cardiac perspective.  The patient is at low risk to proceed without further work up.  If the patient has new chest pain or SOB prior to surgery, they should be revaluated.  EP Perioperative ICD Recommendations: Device Information: Clinic EP Physician:  Dr. Ole Holts  Device Type:  Defibrillator Manufacturer and Phone #:  [employer]: [phone] Pacemaker Dependent?:  No. Date of Last Device Check:  03/02/2024     Normal Device Function?:  Yes.     Electrophysiologist's Recommendations:  Have magnet available. Provide continuous ECG monitoring when magnet is used or reprogramming is to be performed.  Procedure should not interfere with device function.  No device programming or magnet placement needed.    Last visit with pulmonologist Dr. Geronimo was on 02/18/24 for follow-up. She had been followed by Dr. Carolynne Allan since 2018 for OSA and bronchiectasis.  In October 2022 she was hospitalized for worsening respiratory failure.  She had COVID the month prior. PE was ruled out.  Final diagnosis with acute on chronic hypoxemic respiratory failure due to bronchiectasis and pulmonary fibrosis.  She was gradually weaned from oxygen.  She was referred to the ILD clinic after November 2023 high-resolution CT was read for honeycombing findings and progressive changes of ILD. In April 2024 she was  prescribed antifibrotic therapy, but delayed due to transaminitis and then acute PE in 03/2023. She finally  started pirfenidone  around September 2024. At 02/18/24 visit she was feeling overall stable with chronic cough. She had been clearing her through more, but felt dyspnea was stable. PFTs had showing a continued steady decline in DLCO but excess hypoxemia test actually stable. Echo in June 2025 showed mildly elevated PASP. D-dimer and BNP normal on 02/18/24. He discussed ILD monitoring with high resolution chest CT and if progression consider another anti-fibrotic agent. 2-3 month follow-up planned. He added, Okay to have foot surgery and biopsy.  Anesthesia team to evaluate on the day of surgery.  Last Eliquis  reported as 03/30/24. Last Jardiance  reported as 04/01/24.    VS: Ht 5' 6.5 (1.689 m)   Wt 93 kg   LMP  (LMP Unknown)   BMI 32.59 kg/m  BP Readings from Last 3 Encounters:  03/13/24 134/70  03/06/24 126/83  02/18/24 108/68   Pulse Readings from Last 3 Encounters:  03/13/24 78  03/06/24 69  02/18/24 70     PROVIDERS: Leonce Carola PARAS, PA-C is PCP  - Dann Beverage, MD had been primary cardiologist - Cindie Smalls, MD is EP cardiologist - Geronimo Amel, MD is pulmonologist (for ILD) & Neda Hammond, MD (for OSA). GLEN[facility] Albertus Heinz, MD is GI. Last visit 03/13/2024 for follow-up history of GERD and LPR, atrophic gastritis with IM but not dysplasia, small hiatal hernia, history of adenomatous colon polyps, colonic diverticulosis, remote C. difficile infection, elevation in serum transaminases but also CK. She has an umbilical hernia with colon involvement on CT. It was reducible, not incarcerated on exam, but with risk of incarceration if undergoes colonoscopy. Decision to delay colonoscopy until hernia repair decision. If undergoes hernia repair then advised pulmonology input due to pulmonary fibrosis history.  [facility]EN[facility] Onita Duos, MD is neurologist - Lanny Onita, MD is HEM-ONC [facility]OD Nash, Maya, MD is otologist   LABS: Most recent lab results in [facility] include: Lab Results   Component Value Date   WBC 4.3 02/18/2024   HGB 14.2 02/18/2024   HCT 44.0 02/18/2024   PLT 205.0 02/18/2024   GLUCOSE 113 (H) 09/07/2023   CHOL 197 03/06/2024   TRIG 83 03/06/2024   HDL 63 03/06/2024   LDLDIRECT 141.8 07/04/2007   LDLCALC 119 (H) 03/06/2024   ALT 43 (H) 02/18/2024   AST 47 (H) 02/18/2024   NA 139 09/07/2023   K 4.0 09/07/2023   CL 108 09/07/2023   CREATININE 0.81 09/07/2023   BUN 14 09/07/2023   CO2 24 09/07/2023   TSH 3.100 09/12/2022   INR 1.2 (H) 06/05/2023   HGBA1C 5.3 06/27/2022     Spirometry 02/12/2024:  Latest Reference Range & Units 02/12/24 13:54  FVC-Pre L 2.27  FVC-%Pred-Pre % 71  FEV1-Pre L 1.93  FEV1-%Pred-Pre % 80  Pre FEV1/FVC ratio % 85  FEV1FVC-%Pred-Pre % 113  FEF 25-75 Pre L/sec 2.26  FEF2575-%Pred-Pre % 123  FEV6-Pre L 2.23  FEV6-%Pred-Pre % 73  Pre FEV6/FVC Ratio % 100  FEV6FVC-%Pred-Pre % 104    IMAGES: CT Right foot 11/28/2023: IMPRESSION: 1. Well-circumscribed lucent lesion within the middle and distal phalanges of the 3rd toe which appear ankylosed. This lesion appears new from 12/28/2020 radiographs, although there was a possible fracture or osteotomy defect at this site on that study. This lesion is nonspecific; differential includes posttraumatic cyst, infection and neoplasm. Appearance is atypical for gout. Correlate clinically. 2. No other focal lytic lesions identified.  3. Diffuse osseous demineralization and mild degenerative changes. 4. Fibrocartilaginous calcaneonavicular coalition.   CT Abd/pelvis 09/07/2023: IMPRESSION: 1. No acute findings within the abdomen or pelvis. No evidence of appendicitis. 2. Infraumbilical midline hernia containing a loop of transverse colon. No evidence of incarceration or strangulation. Appearance is similar to the prior CT. 3. Extensive colonic diverticulosis without evidence of diverticulitis.  CTA Chest 06/17/2023: IMPRESSION: - Bilateral pulmonary emboli,  right-greater-than-left. Some central emboli on the right. Overall moderate clot burden. - Enlarged heart with some enlargement of the pulmonary artery but this has seen previously. Please correlate for any clinical evidence of right heart strain. - Defibrillator. - Known interstitial lung disease. - Aortic Atherosclerosis (ICD10-I70.0) and Emphysema (ICD10-J43.9).    EKG: EKG 01/15/2024:  Normal sinus rhythm Minimal voltage criteria for LVH, may be normal variant ( R in aVL ) Inferior infarct , age undetermined Confirmed by Lesia Sharper [phone]) on 01/15/2024 8:11:37 AM   CV: Echo 02/04/2024: IMPRESSIONS   1. Left ventricular ejection fraction, by estimation, is 45 to 50%. The  left ventricle has mildly decreased function. The left ventricle  demonstrates global hypokinesis. There is mild concentric left ventricular  hypertrophy. Left ventricular diastolic  parameters are consistent with Grade I diastolic dysfunction (impaired  relaxation).   2. Right ventricular systolic function is normal. The right ventricular  size is normal. There is mildly elevated pulmonary artery systolic  pressure.   3. The mitral valve is normal in structure. Trivial mitral valve  regurgitation. No evidence of mitral stenosis.   4. The aortic valve is tricuspid. Aortic valve regurgitation is not  visualized. No aortic stenosis is present.   5. The inferior vena cava is normal in size with greater than 50%  respiratory variability, suggesting right atrial pressure of 3 mmHg.    LE Venous US  04/02/2023: Summary:  BILATERAL:  -No evidence of popliteal cyst, bilaterally.  RIGHT:  - There is no evidence of deep vein thrombosis in the lower extremity.    LEFT:  - Findings consistent with acute deep vein thrombosis involving the left  femoral vein, and left popliteal vein.      Stress Test 04/13/2020: The left ventricular ejection fraction is mildly decreased (45-54%). Nuclear stress EF:  50%. There was no ST segment deviation noted during stress. The perfusion study is normal. This is a low risk study.    Long Term Monitor 12/16/2019: Indication: palpitations Duration: 7d Findings Symptoms of flutters assoc mostly with PVCs  Symptoms of flutters also assoc with sinus S VT Nonsustained 6 episodes; fastest 141 bpm for 6 beats; longest for 7 beats at 100 bpm  No serious arrhythmia Symptoms assoc with infrequent PVCs    Cardiac cath 02/22/2012:  Impression: 1. No angiographic evidence of disease.   2. Mild LV systolic dysfunction. LVEF 45%. 3. Non-cardiac chest pain Recommendations: No further ischemic workup. Follow up with Dr. Fernande as planned.     Past Medical History:  Diagnosis Date   AICD (automatic cardioverter/defibrillator) present    [employer]   Allergy 09/20/1994   Anxiety    Arthritis    Back pain    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   Bronchiectasis (HCC)    Cerebral aneurysm, nonruptured    had a clip put in   CHF (congestive heart failure) (HCC)    Clostridium difficile infection    Depressive disorder, not elsewhere classified    Diverticulosis of colon (without mention of hemorrhage)  Esophageal reflux    Fatty liver    Fibromyalgia    Gastritis    GERD (gastroesophageal reflux disease)    GI bleed 2004   Glaucoma    Hiatal hernia    History of COVID-19 05/04/2021   Hyperlipidemia    Hypertension    Hypothyroidism    Internal hemorrhoids    Joint pain    Multifocal pneumonia 06/03/2021   Obstructive sleep apnea (adult) (pediatric)    Osteoarthritis    Ostium secundum type atrial septal defect    Other chronic nonalcoholic liver disease    Other pulmonary embolism and infarction    Palpitations    Paroxysmal ventricular tachycardia (HCC)    Personal history of radiation therapy    PONV (postoperative nausea and vomiting)    Presence of permanent cardiac pacemaker    PUD (peptic ulcer  disease)    Radiation 02/03/15-03/10/15   Right Breast   Sarcoid    per pt , not sure   Schatzki's ring    Shortness of breath    with exertion - has oxygen prn via [place] at 2 L   Sleep apnea    uses CPAP nightly   Stroke [facility]) 2013   tia/ pt feels it was around 2008 0r 2009   Takotsubo syndrome    Tubular adenoma of colon    Unspecified transient cerebral ischemia    Unspecified vitamin D  deficiency     Past Surgical History:  Procedure Laterality Date   ABDOMINAL HYSTERECTOMY  11/22/1998   ABI  2006   normal   BREAST EXCISIONAL BIOPSY     BREAST LUMPECTOMY Right 2016   BREAST LUMPECTOMY WITH RADIOACTIVE SEED LOCALIZATION Right 01/03/2015   Procedure: BREAST LUMPECTOMY WITH RADIOACTIVE SEED LOCALIZATION;  Surgeon: Deward Null III, MD;  Location: [facility] OR;  Service: General;  Laterality: Right;   CARDIAC DEFIBRILLATOR PLACEMENT  2006; 2012   BSX single chamber ICD   carotid dopplers  2006   neg   CEREBRAL ANEURYSM REPAIR  02/1999   COLONOSCOPY     EYE SURGERY Right 02/2023   surgery center - for cataracts   HAMMER TOE SURGERY  11/15/2020   3rd digit bilateral feet    hospitalization  2004   GI bleed, PUD, diverticulosis (EGD,colonscopy)   hospitalization     PE, NSVT, s/p defib   JOINT REPLACEMENT  05/2021   KNEE ARTHROSCOPY     bilateral   LEFT HEART CATHETERIZATION WITH CORONARY ANGIOGRAM N/A 02/22/2012   Procedure: LEFT HEART CATHETERIZATION WITH CORONARY ANGIOGRAM;  Surgeon: Lonni [name] Cash, MD;  Location: [facility] CATH LAB;  Service: Cardiovascular;  Laterality: N/A;   PARTIAL HYSTERECTOMY     Fibroids   TONGUE BIOPSY  12/12/2017   due to sore tongue and white patches/abnormal cells   TOTAL KNEE ARTHROPLASTY Left 02/13/2016   Procedure: TOTAL KNEE ARTHROPLASTY;  Surgeon: Marcey Raman, MD;  Location: [facility];  Service: Orthopedics;  Laterality: Left;   TOTAL KNEE ARTHROPLASTY Right 05/22/2021   Procedure: TOTAL KNEE ARTHROPLASTY;  Surgeon: Raman Marcey, MD;  Location: [facility]  ORS;  Service: Orthopedics;  Laterality: Right;  with block   TUBAL LIGATION  09/20/1978   UPPER GASTROINTESTINAL ENDOSCOPY      MEDICATIONS:  lidocaine  (XYLOCAINE ) 1 % (with pres) injection 1 mL   lidocaine  (XYLOCAINE ) 1 % (with pres) injection 3 mL    empagliflozin  (JARDIANCE ) 10 MG TABS tablet   albuterol  (VENTOLIN  HFA) 108 (90 Base) MCG/ACT inhaler   azelastine  (ASTELIN ) 0.1 %  nasal spray   benzonatate  (TESSALON ) 200 MG capsule   budesonide -formoterol  (SYMBICORT ) 80-4.5 MCG/ACT inhaler   Cholecalciferol  (DIALYVITE VITAMIN D  5000) 125 MCG (5000 UT) capsule   cloBAZam  (ONFI ) 10 MG tablet   diltiazem  (CARDIZEM  CD) 120 MG 24 hr capsule   ELIQUIS  5 MG TABS tablet   famotidine  (PEPCID ) 40 MG tablet   fexofenadine (ALLEGRA) 180 MG tablet   fluticasone  (FLONASE ) 50 MCG/ACT nasal spray   FOLIC ACID  PO   furosemide  (LASIX ) 20 MG tablet   hydroquinone 4 % cream   hydroxypropyl methylcellulose / hypromellose (ISOPTO TEARS / GONIOVISC) 2.5 % ophthalmic solution   latanoprost  (XALATAN ) 0.005 % ophthalmic solution   levocetirizine (XYZAL ) 5 MG tablet   levothyroxine  (SYNTHROID ) 50 MCG tablet   magnesium  oxide (MAG-OX) 400 (240 Mg) MG tablet   montelukast  (SINGULAIR ) 10 MG tablet   Multiple Vitamins-Minerals (MULTIVITAMIN PO)   oxyCODONE  (OXY IR/ROXICODONE ) 5 MG immediate release tablet   pantoprazole  (PROTONIX ) 40 MG tablet   Pirfenidone  267 MG TABS   polyethylene glycol powder (GLYCOLAX /MIRALAX ) powder   potassium chloride  (KLOR-CON  M) 10 MEQ tablet   pregabalin  (LYRICA ) 25 MG capsule   REPATHA  SURECLICK 140 MG/ML SOAJ   Respiratory Therapy Supplies (FLUTTER) DEVI   sodium chloride  HYPERTONIC 3 % nebulizer solution   tiZANidine  (ZANAFLEX ) 4 MG tablet   traZODone  (DESYREL ) 100 MG tablet   triamcinolone  cream (KENALOG ) 0.1 %    Verdun Rackley, PA-C Surgical Short Stay/Anesthesiology [facility] Phone [phone] [facility] Phone [phone] 04/01/2024 1:21 PM

## 2024-04-01 NOTE — Progress Notes (Addendum)
 PCP - Carola Mace, PA-C Cardiologist - Dr Candyce Reek EP - Dr Ole Cindie Plough - Dr Gordy Starch Neurology - Dr Modena Callander Pulmonology - Dr Dorethia Cave Rheumatology - Dr Maya Nash (cleared for surgey by rheumatology standpoint on 02/11/24).  Chest x-ray - 06/17/23 EKG - 01/15/24 Stress Test - 03/24/20 ECHO - 02/04/24 Cardiac Cath - 02/22/12 PFT - 02/12/24  [employer] ICD Pacemaker- Perioperative RX for ICD initiated by Almarie Shutter, RN on 03/31/24.  Last remote device check was on 03/02/24. Rep Magdalene Hipp was notified with surgery date/time.   Have magnet available.    Sleep Study -  Yes CPAP - uses CPAP nightly  Diabetes - n/a  Blood Thinner Instructions:  Hold Eliquis  2 days prior procedure.  Last dose was on 03/30/24.  Aspirin  Instructions: n/a  Jardiance  (Heart) - Hold 72 hrs prior to procedure (SDW call).  Last dose was on 04/01/24.  Oxygen - uses 2L via [place] for exertion PRN (rarely per pt).  ERAS - clear liquids til 4:30 AM DOS.  Anesthesia review: Yes, Isaiah, PA  STOP now taking any Aspirin  (unless otherwise instructed by your surgeon), Aleve, Naproxen, Ibuprofen, Motrin, Advil, Goody's, BC's, all herbal medications, fish oil, and all vitamins.   Coronavirus Screening Do you have any of the following symptoms:  Cough yes/no: No Fever (>100.60F)  yes/no: No Runny nose yes/no: No Sore throat yes/no: No Difficulty breathing/shortness of breath  Yes  Have you traveled in the last 14 days and where? [place]   Patient verbalized understanding of instructions that were given via phone.

## 2024-04-01 NOTE — H&P (Addendum)
 Chief Complaint: Right 3rd toe pain HPI: Patient is a 73 year old female who presents to the OR today for surgical treatment for her right third toe pain.  She has a lytic lesion has been symptomatic for greater than 3 months, and elects for surgical intervention.  Allergies:  Allergies  Allergen Reactions   Ace Inhibitors Swelling    Angioedema; makes tongue break out    Amitriptyline Hcl Other (See Comments)     makes her too sleepy!   Atorvastatin Other (See Comments)    SEVERE MYALGIA   Dilantin [Phenytoin Sodium Extended] Rash    Severe rash   Duloxetine  Hcl Nausea Only and Other (See Comments)    Sleepiness/ sick  Other Reaction(s): GI Intolerance   Paroxetine Nausea Only    Rapid heartbeat   Ramipril  Other (See Comments)    TONGUE ULCERS    Rosuvastatin Other (See Comments)    SEVERE MYALGIA   Carbamazepine Rash   Codeine  Itching   Phenytoin Sodium Extended Rash   Simvastatin  Other (See Comments)    Increase in CK, myalgias   Wellbutrin  [Bupropion ] Palpitations    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   Bronchiectasis (HCC)    Cerebral aneurysm, nonruptured    had a clip put in   CHF (congestive heart failure) (HCC)    Clostridium difficile infection    Depressive disorder, not elsewhere classified    Diverticulosis of colon (without mention of hemorrhage)    Esophageal reflux    Fatty liver    Fibromyalgia    Gastritis    GERD (gastroesophageal reflux disease)    GI bleed 2004   Glaucoma    Hiatal hernia    History of COVID-19 05/04/2021   Hyperlipidemia    Hypertension    Hypothyroidism    Internal hemorrhoids    Joint pain    Multifocal pneumonia 06/03/2021   Obstructive sleep apnea (adult) (pediatric)    Osteoarthritis    Ostium secundum type atrial septal  defect    Other chronic nonalcoholic liver disease    Other pulmonary embolism and infarction    Palpitations    Paroxysmal ventricular tachycardia (HCC)    Personal history of radiation therapy    PONV (postoperative nausea and vomiting)    Presence of permanent cardiac pacemaker    PUD (peptic ulcer disease)    Radiation 02/03/15-03/10/15   Right Breast   Sarcoid    per pt , not sure   Schatzki's ring    Shortness of breath    Sleep apnea    Stroke [facility]) 2013   tia/ pt feels it was around 2008 0r 2009   Takotsubo syndrome    Tubular adenoma of colon    Unspecified transient cerebral ischemia    Unspecified vitamin D  deficiency     Past Surgical History:  Procedure Laterality Date   ABDOMINAL HYSTERECTOMY  11/22/1998   ABI  2006   normal   BREAST EXCISIONAL BIOPSY     BREAST LUMPECTOMY Right 2016   BREAST LUMPECTOMY WITH RADIOACTIVE SEED LOCALIZATION Right 01/03/2015   Procedure: BREAST LUMPECTOMY WITH RADIOACTIVE SEED LOCALIZATION;  Surgeon: Deward Null III, MD;  Location: [facility] OR;  Service: General;  Laterality: Right;   CARDIAC DEFIBRILLATOR PLACEMENT  2006; 2012   BSX  single chamber ICD   carotid dopplers  2006   neg   CEREBRAL ANEURYSM REPAIR  02/1999   COLONOSCOPY     EYE SURGERY     HAMMER TOE SURGERY  11/15/2020   3rd digit bilateral feet    hospitalization  2004   GI bleed, PUD, diverticulosis (EGD,colonscopy)   hospitalization     PE, NSVT, s/p defib   JOINT REPLACEMENT  05/2021   KNEE ARTHROSCOPY     bilateral   LEFT HEART CATHETERIZATION WITH CORONARY ANGIOGRAM N/A 02/22/2012   Procedure: LEFT HEART CATHETERIZATION WITH CORONARY ANGIOGRAM;  Surgeon: Lonni [name] Cash, MD;  Location: [facility] CATH LAB;  Service: Cardiovascular;  Laterality: N/A;   PARTIAL HYSTERECTOMY     Fibroids   TONGUE BIOPSY  12/12/2017   due to sore tongue and white patches/abnormal cells   TOTAL KNEE ARTHROPLASTY Left 02/13/2016   Procedure: TOTAL KNEE ARTHROPLASTY;  Surgeon: Marcey Raman, MD;  Location: [facility] OR;  Service: Orthopedics;  Laterality: Left;   TOTAL KNEE ARTHROPLASTY Right 05/22/2021   Procedure: TOTAL KNEE ARTHROPLASTY;  Surgeon: Raman Marcey, MD;  Location: [facility] ORS;  Service: Orthopedics;  Laterality: Right;  with block   TUBAL LIGATION  09/20/1978   UPPER GASTROINTESTINAL ENDOSCOPY      Family History: Family History  Problem Relation Age of Onset   Diabetes Mother    Alzheimer's disease Mother    Hypertension Mother    Obesity Mother    Arthritis Mother    Heart disease Father    Seizures Sister    Allergies Sister    Colon polyps Sister    Heart disease Sister    Early death Sister    Anxiety disorder Sister    Arthritis Sister    Depression Sister    Hypertension Sister    Other Brother        Thyroid  problem 10/2016   Hearing loss Brother    Vision loss Brother    Heart disease Brother    Prostate cancer Brother 15       same brother as throat cancer   Throat cancer Brother        dx in his 62s; also a smoker   Arthritis Brother    Hypertension Brother    Breast cancer Maternal Aunt 55   Colon cancer Maternal Aunt 45       same sister as breast at 61   Breast cancer Maternal Aunt        dx in her 78s   Lung cancer Maternal Uncle    Cancer Maternal Grandmother 36       colon cancer or abdominal cancer   Lung cancer Maternal Grandfather 18   Heart disease Son        Cardiac Arrest 07/2016   Pancreatic cancer Cousin 6       maternal first cousin   Breast cancer Cousin        paternal first cousin twice removed died in her 30s   Stroke Neg Hx    Esophageal cancer Neg Hx    Stomach cancer Neg Hx     Social History:   reports that she quit smoking about 34 years ago. Her smoking use included cigarettes. She started smoking about 54 years ago. She has a 20 pack-year smoking history. She has been exposed to tobacco smoke. She has never used smokeless tobacco. She reports current alcohol  use of about 1.0 standard drink of alcohol   per week. She reports that she  does not currently use drugs.  Medications: No medications prior to admission.    No results found. However, due to the size of the patient record, not all encounters were searched. Please check Results Review for a complete set of results.  No results found.    There were no vitals taken for this visit.  PE:  well nourished and well developed.  NAD.  EOMI.  Resp unlabored. There is darkening of the skin at the distal aspect of the third toe PIP joint.   Assessment/Plan Right third toe pain and lesion  The patient presents to the OR today for treatment for her right third toe pain.  She will require right third toe excisional biopsy.  After reviewing the procedure, postoperative protocol, and risks of surgery she elects for surgical intervention.  The patient specifically understands risks of bleeding, infection, nerve damage, blood clots, need for additional surgery, continued pain, nonunion, post traumatic arthritis, recurrence of deformity, amputation and death.   Eva Barrack PA-C [facility] Office:  [phone]   Agree with findings documented above.  To the OR today for right foot third toe excisional biopsy.  The risks and benefits of the alternative treatment options have been discussed in detail.  The patient wishes to proceed with surgery and specifically understands risks of bleeding, infection, nerve damage, blood clots, need for additional surgery, amputation and death.

## 2024-04-01 NOTE — Anesthesia Preprocedure Evaluation (Addendum)
 Anesthesia Evaluation  Patient identified by MRN, date of birth, ID band Patient awake    Reviewed: Allergy & Precautions, NPO status , Patient's Chart, lab work & pertinent test results  History of Anesthesia Complications (+) PONV and history of anesthetic complications  Airway Mallampati: III  TM Distance: >3 FB Neck ROM: Full    Dental  (+) Dental Advisory Given   Pulmonary neg shortness of breath, sleep apnea and Continuous Positive Airway Pressure Ventilation , neg COPD, neg recent URI, former smoker, PE Bronchiectasis, sarcoid, idiopathic pulmonary fibrosis    Pulmonary exam normal breath sounds clear to auscultation       Cardiovascular hypertension, (-) angina +CHF (EF 45-50%)  (-) Past MI, (-) Cardiac Stents and (-) CABG + dysrhythmias (palpitations) Ventricular Tachycardia + pacemaker + Cardiac Defibrillator [employer], never been shocked) + Valvular Problems/Murmurs MVP  Rhythm:Regular Rate:Normal  PFO, H/o Takotsubo syndrome, HLD  TTE 02/04/2024: IMPRESSIONS    1. Left ventricular ejection fraction, by estimation, is 45 to 50%. The  left ventricle has mildly decreased function. The left ventricle  demonstrates global hypokinesis. There is mild concentric left ventricular  hypertrophy. Left ventricular diastolic  parameters are consistent with Grade I diastolic dysfunction (impaired  relaxation).   2. Right ventricular systolic function is normal. The right ventricular  size is normal. There is mildly elevated pulmonary artery systolic  pressure.   3. The mitral valve is normal in structure. Trivial mitral valve  regurgitation. No evidence of mitral stenosis.   4. The aortic valve is tricuspid. Aortic valve regurgitation is not  visualized. No aortic stenosis is present.   5. The inferior vena cava is normal in size with greater than 50%  respiratory variability, suggesting right atrial pressure of 3 mmHg.      Neuro/Psych Seizures -,  PSYCHIATRIC DISORDERS Anxiety Depression    Cerebral aneurysm s/p clip  Neuromuscular disease (paresthesias in bilateral feet and left hand) CVA (2013), No Residual Symptoms    GI/Hepatic hiatal hernia, PUD,GERD  Medicated,,Fatty liver Diverticulosis    Endo/Other  Hypothyroidism  Pre-diabetes  Renal/GU negative Renal ROS     Musculoskeletal  (+) Arthritis , Osteoarthritis,  Fibromyalgia -  Abdominal   Peds  Hematology negative hematology ROS (+) Lab Results      Component                Value               Date                      WBC                      4.3                 02/18/2024                HGB                      14.2                02/18/2024                HCT                      44.0                02/18/2024  MCV                      91.9                02/18/2024                PLT                      205.0               02/18/2024              Anesthesia Other Findings Last Eliquis : 03/30/2024  Last Jardiance : 04/01/2024  Electrophysiologist's Recommendations:   Have magnet available.  Provide continuous ECG monitoring when magnet is used or reprogramming is to be performed.   Procedure should not interfere with device function.  No device programming or magnet placement needed.   Reproductive/Obstetrics H/o breast cacner                              Anesthesia Physical Anesthesia Plan  ASA: 4  Anesthesia Plan: MAC   Post-op Pain Management: Minimal or no pain anticipated and Tylenol  PO (pre-op)*   Induction: Intravenous  PONV Risk Score and Plan: 3 and Ondansetron , Dexamethasone  and Treatment may vary due to age or medical condition  Airway Management Planned: Natural Airway and Simple Face Mask  Additional Equipment:   Intra-op Plan:   Post-operative Plan:   Informed Consent: I have reviewed the patients History and Physical, chart, labs and discussed the  procedure including the risks, benefits and alternatives for the proposed anesthesia with the patient or authorized representative who has indicated his/her understanding and acceptance.     Dental advisory given  Plan Discussed with: CRNA and Anesthesiologist  Anesthesia Plan Comments: (PAT note written 04/01/2024 by Allison Zelenak, PA-C.  Discussed with patient risks of MAC including, but not limited to, minor pain or discomfort, hearing people in the room, and possible need for backup general anesthesia. Risks for general anesthesia also discussed including, but not limited to, sore throat, hoarse voice, chipped/damaged teeth, injury to vocal cords, nausea and vomiting, allergic reactions, lung infection, heart attack, stroke, and death. All questions answered.   )         Anesthesia Quick Evaluation

## 2024-04-02 ENCOUNTER — Encounter (HOSPITAL_COMMUNITY)
Admission: RE | Disposition: A | Discharge status: Home / Self Care | Payer: Self-pay | Source: Home / Self Care | Attending: Orthopedic Surgery

## 2024-04-02 ENCOUNTER — Ambulatory Visit (HOSPITAL_COMMUNITY)
Admission: RE | Admit: 2024-04-02 | Discharge: 2024-04-02 | Disposition: A | Discharge status: Home / Self Care | Attending: Orthopedic Surgery | Admitting: Orthopedic Surgery

## 2024-04-02 ENCOUNTER — Ambulatory Visit (HOSPITAL_COMMUNITY)

## 2024-04-02 ENCOUNTER — Ambulatory Visit (HOSPITAL_COMMUNITY): Payer: Self-pay | Admitting: Anesthesiology

## 2024-04-02 ENCOUNTER — Encounter (HOSPITAL_COMMUNITY): Payer: Self-pay | Admitting: Orthopedic Surgery

## 2024-04-02 ENCOUNTER — Other Ambulatory Visit: Payer: Self-pay

## 2024-04-02 ENCOUNTER — Ambulatory Visit (HOSPITAL_BASED_OUTPATIENT_CLINIC_OR_DEPARTMENT_OTHER): Payer: Self-pay | Admitting: Anesthesiology

## 2024-04-02 ENCOUNTER — Encounter

## 2024-04-02 DIAGNOSIS — Z87891 Personal history of nicotine dependence: Secondary | ICD-10-CM | POA: Insufficient documentation

## 2024-04-02 DIAGNOSIS — Z8679 Personal history of other diseases of the circulatory system: Secondary | ICD-10-CM | POA: Diagnosis not present

## 2024-04-02 DIAGNOSIS — I341 Nonrheumatic mitral (valve) prolapse: Secondary | ICD-10-CM

## 2024-04-02 DIAGNOSIS — G4733 Obstructive sleep apnea (adult) (pediatric): Secondary | ICD-10-CM | POA: Diagnosis not present

## 2024-04-02 DIAGNOSIS — I509 Heart failure, unspecified: Secondary | ICD-10-CM

## 2024-04-02 DIAGNOSIS — J84112 Idiopathic pulmonary fibrosis: Secondary | ICD-10-CM | POA: Diagnosis not present

## 2024-04-02 DIAGNOSIS — Z86711 Personal history of pulmonary embolism: Secondary | ICD-10-CM | POA: Insufficient documentation

## 2024-04-02 DIAGNOSIS — J479 Bronchiectasis, uncomplicated: Secondary | ICD-10-CM | POA: Diagnosis not present

## 2024-04-02 DIAGNOSIS — R7303 Prediabetes: Secondary | ICD-10-CM | POA: Insufficient documentation

## 2024-04-02 DIAGNOSIS — Z7901 Long term (current) use of anticoagulants: Secondary | ICD-10-CM | POA: Diagnosis not present

## 2024-04-02 DIAGNOSIS — M85671 Other cyst of bone, right ankle and foot: Secondary | ICD-10-CM | POA: Diagnosis not present

## 2024-04-02 DIAGNOSIS — I472 Ventricular tachycardia, unspecified: Secondary | ICD-10-CM | POA: Diagnosis not present

## 2024-04-02 DIAGNOSIS — I110 Hypertensive heart disease with heart failure: Secondary | ICD-10-CM | POA: Diagnosis not present

## 2024-04-02 DIAGNOSIS — M79674 Pain in right toe(s): Secondary | ICD-10-CM

## 2024-04-02 DIAGNOSIS — Z8711 Personal history of peptic ulcer disease: Secondary | ICD-10-CM | POA: Diagnosis not present

## 2024-04-02 DIAGNOSIS — M85471 Solitary bone cyst, right ankle and foot: Secondary | ICD-10-CM | POA: Diagnosis present

## 2024-04-02 DIAGNOSIS — Z7984 Long term (current) use of oral hypoglycemic drugs: Secondary | ICD-10-CM | POA: Diagnosis not present

## 2024-04-02 DIAGNOSIS — Z95810 Presence of automatic (implantable) cardiac defibrillator: Secondary | ICD-10-CM | POA: Insufficient documentation

## 2024-04-02 HISTORY — PX: BONE EXOSTOSIS EXCISION: SHX1249

## 2024-04-02 LAB — CBC
HCT: 44.1 % (ref 36.0–46.0)
Hemoglobin: 13.9 g/dL (ref 12.0–15.0)
MCH: 29.9 pg (ref 26.0–34.0)
MCHC: 31.5 g/dL (ref 30.0–36.0)
MCV: 94.8 fL (ref 80.0–100.0)
Platelets: 174 K/uL (ref 150–400)
RBC: 4.65 MIL/uL (ref 3.87–5.11)
RDW: 13.6 % (ref 11.5–15.5)
WBC: 4.2 K/uL (ref 4.0–10.5)
nRBC: 0 % (ref 0.0–0.2)

## 2024-04-02 SURGERY — EXCISION, EXOSTOSIS
Anesthesia: Monitor Anesthesia Care | Site: Third Toe | Laterality: Right

## 2024-04-02 MED ORDER — BUPIVACAINE HCL (PF) 0.25 % IJ SOLN
INTRAMUSCULAR | Status: AC
  Filled 2024-04-02: qty 30

## 2024-04-02 MED ORDER — VANCOMYCIN HCL 500 MG IV SOLR
INTRAVENOUS | Status: DC | PRN
  Administered 2024-04-02: 500 mg via TOPICAL

## 2024-04-02 MED ORDER — MIDAZOLAM HCL 2 MG/2ML IJ SOLN
INTRAMUSCULAR | Status: DC | PRN
  Administered 2024-04-02 (×2): 1 mg via INTRAVENOUS

## 2024-04-02 MED ORDER — VANCOMYCIN HCL 500 MG IV SOLR
INTRAVENOUS | Status: AC
Start: 2024-04-02 — End: 2024-04-02
  Filled 2024-04-02: qty 10

## 2024-04-02 MED ORDER — 0.9 % SODIUM CHLORIDE (POUR BTL) OPTIME
TOPICAL | Status: DC | PRN
  Administered 2024-04-02: 1000 mL

## 2024-04-02 MED ORDER — ACETAMINOPHEN 500 MG PO TABS
1000.0000 mg | ORAL_TABLET | Freq: Once | ORAL | Status: DC
  Filled 2024-04-02: qty 2

## 2024-04-02 MED ORDER — CHLORHEXIDINE GLUCONATE 0.12 % MT SOLN
15.0000 mL | Freq: Once | OROMUCOSAL | Status: AC
  Administered 2024-04-02: 15 mL via OROMUCOSAL
  Filled 2024-04-02: qty 15

## 2024-04-02 MED ORDER — BUPIVACAINE-EPINEPHRINE (PF) 0.5% -1:200000 IJ SOLN
INTRAMUSCULAR | Status: AC
  Filled 2024-04-02: qty 30

## 2024-04-02 MED ORDER — BACITRACIN ZINC 500 UNIT/GM EX OINT
TOPICAL_OINTMENT | CUTANEOUS | Status: AC
  Filled 2024-04-02: qty 28.35

## 2024-04-02 MED ORDER — AMISULPRIDE (ANTIEMETIC) 5 MG/2ML IV SOLN: 10.0000 mg | Freq: Once | INTRAVENOUS | Status: DC | PRN

## 2024-04-02 MED ORDER — LIDOCAINE HCL (PF) 1 % IJ SOLN
INTRAMUSCULAR | Status: AC
  Filled 2024-04-02: qty 30

## 2024-04-02 MED ORDER — BUPIVACAINE HCL (PF) 0.5 % IJ SOLN
INTRAMUSCULAR | Status: AC
  Filled 2024-04-02: qty 30

## 2024-04-02 MED ORDER — PROPOFOL 1000 MG/100ML IV EMUL
INTRAVENOUS | Status: AC
  Filled 2024-04-02: qty 100

## 2024-04-02 MED ORDER — OXYCODONE HCL 5 MG PO TABS: 5.0000 mg | ORAL_TABLET | Freq: Once | ORAL | Status: DC | PRN

## 2024-04-02 MED ORDER — GLYCOPYRROLATE PF 0.2 MG/ML IJ SOSY
PREFILLED_SYRINGE | INTRAMUSCULAR | Status: DC | PRN
  Administered 2024-04-02: .1 mg via INTRAVENOUS

## 2024-04-02 MED ORDER — STERILE WATER FOR IRRIGATION IR SOLN
Status: DC | PRN
  Administered 2024-04-02: 1000 mL

## 2024-04-02 MED ORDER — OXYCODONE HCL 5 MG/5ML PO SOLN: 5.0000 mg | Freq: Once | ORAL | Status: DC | PRN

## 2024-04-02 MED ORDER — CEFAZOLIN SODIUM-DEXTROSE 2-4 GM/100ML-% IV SOLN
2.0000 g | INTRAVENOUS | Status: AC
  Administered 2024-04-02: 2 g via INTRAVENOUS
  Filled 2024-04-02: qty 100

## 2024-04-02 MED ORDER — FENTANYL CITRATE (PF) 100 MCG/2ML IJ SOLN: 25.0000 ug | INTRAMUSCULAR | Status: DC | PRN

## 2024-04-02 MED ORDER — PROPOFOL 500 MG/50ML IV EMUL
INTRAVENOUS | Status: DC | PRN
  Administered 2024-04-02: 30 mg via INTRAVENOUS
  Administered 2024-04-02: 50 mg via INTRAVENOUS
  Administered 2024-04-02: 75 ug/kg/min via INTRAVENOUS

## 2024-04-02 MED ORDER — BUPIVACAINE-EPINEPHRINE (PF) 0.25% -1:200000 IJ SOLN
INTRAMUSCULAR | Status: DC | PRN
  Administered 2024-04-02: 10 mL

## 2024-04-02 MED ORDER — MIDAZOLAM HCL 2 MG/2ML IJ SOLN
INTRAMUSCULAR | Status: AC
Start: 2024-04-02 — End: 2024-04-02
  Filled 2024-04-02: qty 2

## 2024-04-02 MED ORDER — ORAL CARE MOUTH RINSE: 15.0000 mL | Freq: Once | OROMUCOSAL | Status: AC

## 2024-04-02 MED ORDER — LACTATED RINGERS IV SOLN: INTRAVENOUS | Status: DC

## 2024-04-02 SURGICAL SUPPLY — 14 items
BAG COUNTER SPONGE SURGICOUNT (BAG) ×2 IMPLANT
BNDG ELASTIC 4INX 5YD STR LF (GAUZE/BANDAGES/DRESSINGS) IMPLANT
DRAPE U-SHAPE 47X51 STRL (DRAPES) ×2 IMPLANT
DRSG EMULSION OIL 3X3 NADH (GAUZE/BANDAGES/DRESSINGS) IMPLANT
GAUZE SPONGE 2X2 8PLY STRL LF (GAUZE/BANDAGES/DRESSINGS) IMPLANT
GLOVE BIO SURGEON STRL SZ7 (GLOVE) ×4 IMPLANT
GLOVE BIOGEL PI IND STRL 7.5 (GLOVE) ×2 IMPLANT
KIT TURNOVER KIT B (KITS) ×2 IMPLANT
PAD ARMBOARD POSITIONER FOAM (MISCELLANEOUS) ×4 IMPLANT
PADDING CAST ABS COTTON 4X4 ST (CAST SUPPLIES) IMPLANT
PENCIL BUTTON HOLSTER BLD 10FT (ELECTRODE) IMPLANT
SUT NYLON 3 0 (SUTURE) IMPLANT
SWAB COLLECTION DEVICE MRSA (MISCELLANEOUS) IMPLANT
SWAB CULTURE ESWAB REG 1ML (MISCELLANEOUS) IMPLANT

## 2024-04-02 NOTE — Op Note (Signed)
 04/02/2024  8:23 AM  PATIENT:  [name]  73 y.o. female  PRE-OPERATIVE DIAGNOSIS: Right foot third toe middle phalanx bone cyst  POST-OPERATIVE DIAGNOSIS: Same  Procedure(s):  debridement of right foot 3rd toe middle phalanx bone cyst with excisional biopsy  SURGEON:  Norleen Armor, MD  ASSISTANT: Eva Barrack, PA-C  ANESTHESIA:   local, MAC  EBL:  minimal   TOURNIQUET: Less than 15 minutes with an ankle Esmarch  COMPLICATIONS:  None apparent  DISPOSITION:  Extubated, awake and stable to recovery.  INDICATION FOR PROCEDURE: 73 year old female complains of swelling and pain at the right foot third toe.  Podiatry ordered a CT scan earlier this year showing a cyst in the third toe middle phalanx.  Differential includes infection, gout and posttraumatic cyst.  She presents now for open biopsy.  She understands the risks and benefits of the alternative treatment options and elect surgical treatment.  She specifically understands risks of bleeding, infection, nerve damage, blood clots, need for additional surgery, amputation.  She understands that the cyst could represent a malignancy that could require further evaluation and treatment.  PROCEDURE IN DETAIL: After preoperative consent was obtained and the correct operative site was identified, the patient was brought to the operating room and placed supine on the operating table.  General anesthesia was induced.  Preoperative antibiotics were administered.  Surgical timeout was taken.  Right lower extremity was prepped and draped in standard sterile fashion.  An intermetatarsal block was performed with quarter percent Marcaine  with epinephrine .  The foot was then exsanguinated Fornage Esmarch tourniquet wrapped ankle.  A dorsal lateral incision was made longitudinally over the middle phalanx.  Dissection was carried sharply down to the subcutaneous tissues.  The extensor tendon was protected.  The cortical bone of the middle phalanx was  identified.  A corticotomy was made with a small curette.  The cyst was identified.  There was no evidence of tophaceous material.  The scant amount of fluid appeared clear with small fat globules consistent with normal bone marrow.  There was no significant soft tissue content within the cyst.  The cavity was debrided to the level of the cortical bone circumferentially with a curette.  The cortical bone felt normal and was not soft or spongy.  All of the debrided material was sent as a specimen to pathology.  The cavity was then swabbed with anaerobic and aerobic culture swabs.  The wound was irrigated copiously and sprinkled with vancomycin  powder.  The skin incision was closed with horizontal mattress suture of 3-0 nylon.  Sterile dressings were applied followed by a compression wrap.  The tourniquet was released prior to closure, and hemostasis was achieved.  The patient was awakened from anesthesia and transported to the recovery room in stable condition.   FOLLOW UP PLAN: Weightbearing as tolerated in a flat postop shoe.  That she may remove the dressing in 3 days postop and a Band-Aid on the incision.  Follow-up in 2 weeks for suture removal and pathology results.  She may resume all of her regular medications.  No indication for DVT prophylaxis in this ambulatory patient.    Justin Ollis PA-C was present and scrubbed for the duration of the operative case. His assistance was essential in positioning the patient, prepping and draping, gaining and maintaining exposure, performing the operation, closing and dressing the wounds and applying the splint.

## 2024-04-02 NOTE — Progress Notes (Signed)
 Orthopedic Tech Progress Note Patient Details:  [name] 06/01/1951 [id number]  Ortho Devices Type of Ortho Device: Postop shoe/boot Ortho Device/Splint Location: RLE Ortho Device/Splint Interventions: Application, Ordered   Post Interventions Patient Tolerated: Well  [name] 04/02/2024, 9:23 AM

## 2024-04-02 NOTE — Anesthesia Postprocedure Evaluation (Signed)
 Anesthesia Post Note  Patient: [name]  Procedure(s) Performed: EXCISION, EXOSTOSIS (Right: Third Toe)     Patient location during evaluation: PACU Anesthesia Type: MAC Level of consciousness: awake Pain management: pain level controlled Vital Signs Assessment: post-procedure vital signs reviewed and stable Respiratory status: spontaneous breathing, nonlabored ventilation and respiratory function stable Cardiovascular status: stable and blood pressure returned to baseline Postop Assessment: no apparent nausea or vomiting Anesthetic complications: no   No notable events documented.  Last Vitals:  Vitals:   04/02/24 0845 04/02/24 0900  BP: (!) 128/56 130/64  Pulse: 75 76  Resp: 20 18  Temp:  (!) 36.2 C  SpO2: 97% 97%    Last Pain:  Vitals:   04/02/24 0900  TempSrc:   PainSc: 0-No pain                 Delon Aisha Arch

## 2024-04-02 NOTE — Transfer of Care (Signed)
 Immediate Anesthesia Transfer of Care Note  Patient: [name]  Procedure(s) Performed: EXCISION, EXOSTOSIS (Right: Third Toe)  Patient Location: PACU  Anesthesia Type:MAC  Level of Consciousness: awake  Airway & Oxygen Therapy: Patient Spontanous Breathing  Post-op Assessment: Report given to RN and Post -op Vital signs reviewed and stable  Post vital signs: Reviewed and stable  Last Vitals:  Vitals Value Taken Time  BP 132/66 04/02/24 08:19  Temp    Pulse 81 04/02/24 08:22  Resp 22 04/02/24 08:22  SpO2 97 % 04/02/24 08:22  Vitals shown include unfiled device data.  Last Pain:  Vitals:   04/02/24 0614  TempSrc:   PainSc: 0-No pain         Complications: No notable events documented.

## 2024-04-02 NOTE — Discharge Instructions (Signed)
 Norleen Armor, MD [facility]  Please read the following information regarding your care after surgery.  Medications  You only need a prescription for the narcotic pain medicine (ex. oxycodone , Percocet, Norco).  All of the other medicines listed below are available over the counter. ? Aleve 2 pills twice a day for the first 3 days after surgery. ? acetominophen (Tylenol ) 650 mg every 4-6 hours as you need for minor to moderate pain ? resume your oxycodone  as prescribed for severe pain  Narcotic pain medicine (ex. oxycodone , Percocet, Vicodin) will cause constipation.  To prevent this problem, take the following medicines while you are taking any pain medicine. ? docusate sodium  (Colace) 100 mg twice a day ? senna (Senokot) 2 tablets twice a day  ? To help prevent blood clots, resume your Eliquis  the day after surgery.  Weight Bearing ? Bear weight when you are able on your operated leg or foot.   Cast / Splint / Dressing ? Remove your dressing 3 days after surgery and cover the incisions with dry dressings.    After your dressing, cast or splint is removed; you may shower, but do not soak or scrub the wound.  Allow the water  to run over it, and then gently pat it dry.  Swelling It is normal for you to have swelling where you had surgery.  To reduce swelling and pain, keep your toes above your nose for at least 3 days after surgery.  It may be necessary to keep your foot or leg elevated for several weeks.  If it hurts, it should be elevated.  Follow Up Call my office at [phone] when you are discharged from the hospital or surgery center to schedule an appointment to be seen two weeks after surgery.  Call my office at [phone] if you develop a fever >101.5 F, nausea, vomiting, bleeding from the surgical site or severe pain.

## 2024-04-03 ENCOUNTER — Encounter (HOSPITAL_COMMUNITY): Payer: Self-pay | Admitting: Orthopedic Surgery

## 2024-04-03 ENCOUNTER — Ambulatory Visit (HOSPITAL_BASED_OUTPATIENT_CLINIC_OR_DEPARTMENT_OTHER)

## 2024-04-03 LAB — SURGICAL PATHOLOGY

## 2024-04-06 ENCOUNTER — Other Ambulatory Visit: Payer: Self-pay | Admitting: Physician Assistant

## 2024-04-06 ENCOUNTER — Ambulatory Visit: Attending: Internal Medicine | Admitting: Speech Pathology

## 2024-04-06 ENCOUNTER — Encounter: Payer: Self-pay | Admitting: Speech Pathology

## 2024-04-06 ENCOUNTER — Other Ambulatory Visit: Payer: Self-pay

## 2024-04-06 DIAGNOSIS — R498 Other voice and resonance disorders: Secondary | ICD-10-CM | POA: Insufficient documentation

## 2024-04-06 NOTE — Therapy (Signed)
 OUTPATIENT SPEECH LANGUAGE PATHOLOGY VOICE EVALUATION   Patient Name: [name] MRN: [id number] DOB:1951/06/23, 73 y.o., female Today's Date: 04/06/2024  PCP: Leonce Carola PARAS PA-C REFERRING PROVIDER: Geronimo Amel, MD  END OF SESSION:  End of Session - 04/06/24 1537     Visit Number 1    Number of Visits 6    Date for SLP Re-Evaluation 06/29/24          Past Medical History:  Diagnosis Date   AICD (automatic cardioverter/defibrillator) present    [employer]   Allergy 09/20/1994   Anxiety    Arthritis    Back pain    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   Bronchiectasis (HCC)    Cerebral aneurysm, nonruptured    had a clip put in   CHF (congestive heart failure) (HCC)    Clostridium difficile infection    Depressive disorder, not elsewhere classified    Diverticulosis of colon (without mention of hemorrhage)    Esophageal reflux    Fatty liver    Fibromyalgia    Gastritis    GERD (gastroesophageal reflux disease)    GI bleed 2004   Glaucoma    Hiatal hernia    History of COVID-19 05/04/2021   Hyperlipidemia    Hypertension    Hypothyroidism    Internal hemorrhoids    Joint pain    Multifocal pneumonia 06/03/2021   Obstructive sleep apnea (adult) (pediatric)    Osteoarthritis    Ostium secundum type atrial septal defect    Other chronic nonalcoholic liver disease    Other pulmonary embolism and infarction    Palpitations    Paroxysmal ventricular tachycardia (HCC)    Personal history of radiation therapy    PONV (postoperative nausea and vomiting)    Presence of permanent cardiac pacemaker    PUD (peptic ulcer disease)    Radiation 02/03/15-03/10/15   Right Breast   Sarcoid    per pt , not sure   Schatzki's ring    Shortness of breath    with exertion - has oxygen prn via [place] at 2 L   Sleep apnea    uses CPAP nightly   Stroke [facility]) 2013   tia/ pt feels it was around 2008 0r 2009   Takotsubo syndrome     Tubular adenoma of colon    Unspecified transient cerebral ischemia    Unspecified vitamin D  deficiency    Past Surgical History:  Procedure Laterality Date   ABDOMINAL HYSTERECTOMY  11/22/1998   ABI  2006   normal   BONE EXOSTOSIS EXCISION Right 04/02/2024   Procedure: EXCISION, EXOSTOSIS;  Surgeon: Kit Rush, MD;  Location: [facility] OR;  Service: Orthopedics;  Laterality: Right;   BREAST EXCISIONAL BIOPSY     BREAST LUMPECTOMY Right 2016   BREAST LUMPECTOMY WITH RADIOACTIVE SEED LOCALIZATION Right 01/03/2015   Procedure: BREAST LUMPECTOMY WITH RADIOACTIVE SEED LOCALIZATION;  Surgeon: Deward Null III, MD;  Location: [facility] OR;  Service: General;  Laterality: Right;   CARDIAC DEFIBRILLATOR PLACEMENT  2006; 2012   BSX single chamber ICD   carotid dopplers  2006   neg   CEREBRAL ANEURYSM REPAIR  02/1999   COLONOSCOPY     EYE SURGERY Right 02/2023   surgery center - for cataracts   HAMMER TOE SURGERY  11/15/2020   3rd digit bilateral feet    hospitalization  2004   GI bleed, PUD, diverticulosis (EGD,colonscopy)   hospitalization     PE,  NSVT, s/p defib   JOINT REPLACEMENT  05/2021   KNEE ARTHROSCOPY     bilateral   LEFT HEART CATHETERIZATION WITH CORONARY ANGIOGRAM N/A 02/22/2012   Procedure: LEFT HEART CATHETERIZATION WITH CORONARY ANGIOGRAM;  Surgeon: Lonni [name] Cash, MD;  Location[facility] CATH LAB;  Service: Cardiovascular;  Laterality: N/A;   PARTIAL HYSTERECTOMY     Fibroids   TONGUE BIOPSY  12/12/2017   due to sore tongue and white patches/abnormal cells   TOTAL KNEE ARTHROPLASTY Left 02/13/2016   Procedure: TOTAL KNEE ARTHROPLASTY;  Surgeon: Marcey Raman, MD;  Location: [facility] OR;  Service: Orthopedics;  Laterality: Left;   TOTAL KNEE ARTHROPLASTY Right 05/22/2021   Procedure: TOTAL KNEE ARTHROPLASTY;  Surgeon: Raman Marcey, MD;  Location: [facility] ORS;  Service: Orthopedics;  Laterality: Right;  with block   TUBAL LIGATION  09/20/1978   UPPER GASTROINTESTINAL ENDOSCOPY     Patient  Active Problem List   Diagnosis Date Noted   Pulmonary embolism (HCC) 04/03/2023   Acute pulmonary embolism (HCC) 04/02/2023   COVID-19 virus infection 03/12/2023   IPF (idiopathic pulmonary fibrosis) (HCC) 02/25/2023   Bicipital tendinitis of left shoulder 11/09/2022   Partial symptomatic epilepsy with complex partial seizures, not intractable, without status epilepticus (HCC) 08/10/2022   Low back pain 08/10/2022   Gait abnormality 05/04/2022   Muscle spasm 05/04/2022   Upper airway cough syndrome 01/19/2022   Chronic left shoulder pain 11/13/2021   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   Localized swelling of right lower leg 08/29/2021   Tonsillar exudate 08/29/2021   Candida vaginitis 08/28/2021   Chronic pain syndrome 08/25/2021   Multifocal pneumonia 06/03/2021   Pre-operative respiratory examination 05/18/2021   Statin myopathy 04/19/2021   Pain in joint of right shoulder 02/10/2021   Arthritis of both acromioclavicular joints 01/02/2021   Trochanteric bursitis of right hip 10/03/2020   Tailbone injury 05/05/2020   UTI (urinary tract infection) 05/05/2020   Insomnia 05/05/2020   Leg cramps 02/16/2020   Dysuria 02/16/2020   Right hip impingement syndrome 02/01/2020   Right bicipital tenosynovitis 02/01/2020   Bronchiectasis without complication (HCC) 01/22/2020   Myofascial pain 12/21/2019   Estrogen deficiency 12/10/2019   Obesity (BMI 30-39.9) 12/10/2019   Bronchiectasis with (acute) exacerbation (HCC) 10/23/2019   Elevated LFTs 10/15/2019   Lower abdominal pain 09/01/2019   Neck pain 10/25/2017   Paresthesias in left hand 10/25/2017   Paresthesia of foot, bilateral 04/12/2017   Primary osteoarthritis of both hands 09/27/2016   Primary osteoarthritis of both knees 09/27/2016   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   Nasopharyngitis, 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/2016   S/P total knee replacement 02/13/2016   Chronic pain of both knees 12/06/2015   Globus pharyngeus 05/17/2015    Genetic testing 12/23/2014   Family history of breast cancer    Family history of colon cancer    Family history of pancreatic cancer    History of breast cancer 11/23/2014   Allergy to ACE inhibitors 09/27/2014   Prediabetes 06/23/2013   Muscle spasms of both lower extremities 01/15/2013   Hx of Clostridium difficile infection 10/10/2012   Dyspnea on exertion 04/02/2012   NICM (nonischemic cardiomyopathy) (HCC) 03/07/2012   Post-menopausal 01/11/2012   Atypical chest pain 11/07/2011   Obstructive sleep apnea 04/04/2010   V-tach (HCC) 01/03/2009   ICD  [employer]  Single chamber 01/03/2009   PFO (patent foramen ovale) 09/29/2008   Vitamin D  deficiency 08/17/2008   Hypothyroidism 11/18/2006   Hyperlipidemia 11/18/2006   Depression with anxiety 11/18/2006  Essential hypertension 11/18/2006   Mitral valve prolapse 11/18/2006   Cerebral aneurysm 11/18/2006   Allergic rhinitis 11/18/2006   GERD 11/18/2006   Diverticulosis of colon 11/18/2006   Fatty liver 11/18/2006   Fibromyalgia 11/18/2006    Onset date: 02/18/2024  REFERRING DIAG: G15.887 (ICD-10-CM) - IPF (idiopathic pulmonary fibrosis) (HCC) Z51.81 (ICD-10-CM) - Medication monitoring encounter G47.33 (ICD-10-CM) - OSA (obstructive sleep apnea) R05.3 (ICD-10-CM) - Chronic cough Z91.09 (ICD-10-CM) - House dust mite allergy R09.89 (ICD-10-CM) - Chronic throat clearing Z86.711 (ICD-10-CM) - History of pulmonary embolism  THERAPY DIAG:  Other voice and resonance disorders  Rationale for Evaluation and Treatment: Rehabilitation  SUBJECTIVE:   SUBJECTIVE STATEMENT: I'm sick of the coughing Pt accompanied by: self  PERTINENT HISTORY: Dickey Roseann [place] 73 y.o. -returns for follow-up.  She says she is feeling stable but she is mostly bothered by chronic cough with clearing of the throat for the last 2 months.  She states that when she is out of the house the cough is less.  She does have dust mite allergy and she says  she is taking all the precautions but nevertheless when she is out of the house the cough is less.  She is also clearing the throat a lot.  She is asking for interventions.  But from a shortness of breath perspective she is stable.  She is tolerating Esbriet  well.  She had pulmonary function testing that shows a continued steady decline in DLCO  PAIN:  Are you having pain? No  FALLS: Has patient fallen in last 6 months? No, Number of falls: 0  LIVING ENVIRONMENT: Lives with: lives alone Lives in: House/apartment  PLOF:Level of assistance: Independent with ADLs, Independent with IADLs Employment: Retired  PATIENT GOALS:   OBJECTIVE:  Note: Objective measures were completed at Evaluation unless otherwise noted.   COGNITION: Overall cognitive status: Within functional limits for tasks assessed   SOCIAL HISTORY: Occupation: Retired Water  intake: optimal Caffeine/alcohol  intake: minimal Daily voice use: minimal  PERCEPTUAL VOICE ASSESSMENT: Voice quality: hoarse and low vocal intensity Vocal abuse: habitual throat clearing - 11x this session Resonance: normal Respiratory function: thoracic breathing  OBJECTIVE VOICE ASSESSMENT: Maximum phonation time for sustained ah: 9.46 Conversational loudness average: 66 dB Conversational loudness range: 62-68 dB (70dB is WNL) S/z ratio: 1.05 (Suggestive of dysfunction >1.4)  PATIENT REPORTED OUTCOME MEASURES (PROM): V-RQOL: 22 - she rated a 3 or moderate problem: speaking loudly and being heard, feeling frustrated due to her voice, having to repeat her self to be understood and rated a 2 for not knowing what will come out when she starts speaking.                                                                                                                             TREATMENT DATE:   04/06/24: Eval completed - initiated training in throat clear awareness and throat clear avoidance. Initially she required consistent mod A to ID  throat clearing  behavior, which improved to occasional min verbal and visual cues (tally Pica). After initial training, [name] throat clear suppression with hard swallow, sniffs and/or sip 3/5x with usual mod A. Initiated training in cough suppression using gentle, relaxed sniff-sniff blow pattern and intermittent swallows or sips. She did not have coughing episode this session, however she [name] these strategies with rare min A in isolation as practice. Education re: irritable larynx, LPR and post nasal drip provided   PATIENT EDUCATION: Education details: See Treatment, See patient instructions, reflux education, strategies to suppress cough and throat clear, vocal hygiene Person educated: Patient Education method: Explanation, Demonstration, Verbal cues, and Handouts Education comprehension: verbalized understanding, verbal cues required, and needs further education  HOME EXERCISE PROGRAM: TBD - this week focus on identifying and eliminating throat clearing behavior  GOALS: Goals reviewed with patient? Yes   LONG TERM GOALS: Target date: 06/29/24 - expecting 4-6 visits - extended date due to scheduling  Pt will clear her throat 2 or less times over 2 sessions Baseline:  Goal status: INITIAL  2.  Pt will complete HEP for voice with mod I Baseline:  Goal status: INITIAL  3.  Pt will use strategies to suppress or prevent cough with occasional min A over 2 sessions Baseline:  Goal status: INITIAL  4.  Pt will report 50% reduction in coughing subjectively Baseline:  Goal status: INITIAL  5.  Pt will achieve clear phonation and WNL volume 18/20 sentences Baseline:  Goal status: INITIAL  6.  Pt will maintain WNL volume and clear phonation over 8 minute conversation with rare min A Baseline:  Goal status: INITIAL  ASSESSMENT:  CLINICAL IMPRESSION: Patient is a 73 y.o. female who was seen today for chronic cough and hoarse voice. She reports voice has been  hoarse for about 2 months. She is referred by pulmonologist for chronic cough and irritable larynx. She identifies perfume, scents, laughing, post nasal drip and smoke as triggers for her cough. She reports the cough in uncontrollable at times and garners unwanted attention. Others ask if she is sick or needs water . She has stopped reading at church due to voice changes and is unable to sing due to voice changes. She is on PPI in am and H2 blocker pm for GERD as well as  flonase  for nasal drip and allergies. She consistently clears her throat reporting globus sensation. She reports she has to repeat herself frequently due to voice changes. Her conversation volume is low (66dB) as well. Trials of high intensity voice exercises did result in improved voice quality indicating she is a good candidate for speech therapy. I recommend skilled ST to reduce sx of irritable larynx and improve intelligibility for safety, QOL.  OBJECTIVE IMPAIRMENTS: include voice disorder. These impairments are limiting patient from effectively communicating at home and in community. Factors affecting potential to achieve goals and functional outcome are n/a.SABRA Patient will benefit from skilled SLP services to address above impairments and improve overall function.  REHAB POTENTIAL: Good  PLAN:  SLP FREQUENCY: 1-2x/week  SLP DURATION: 12 weeks  PLANNED INTERVENTIONS: Diet toleration management , Environmental controls, Internal/external aids, Functional tasks, Multimodal communication approach, SLP instruction and feedback, Compensatory strategies, Patient/family education, [phone] Treatment of speech (30 or 45 min) , and [id number]- Speech Eval Behavioral Qualitative Voice Resonance    Mathis Leita Caldron, CCC-SLP 04/06/2024, 3:53 PM

## 2024-04-06 NOTE — Patient Instructions (Addendum)
   The reflux medications you are on get rid of the acid, but it does not stop the reflux  Reflux up to your throat can cause coughing and throat clearing as well as the post nasal drip  When you are coughing or feel like you are going to cough, relax your neck, and do 2 gentle sniffs and an blow (audible blow)  There is an all natural supplement called Reflux Gourmet which blocks reflux - it is an algae based product that lasts about 4 hours  [employer] take it after each meal and before bed  Dr. Okey - [phone] - laryngologist if your voice or cough does not improve

## 2024-04-07 LAB — AEROBIC/ANAEROBIC CULTURE W GRAM STAIN (SURGICAL/DEEP WOUND)
Culture: NO GROWTH
Gram Stain: NONE SEEN

## 2024-04-08 ENCOUNTER — Ambulatory Visit

## 2024-04-09 ENCOUNTER — Ambulatory Visit (INDEPENDENT_AMBULATORY_CARE_PROVIDER_SITE_OTHER): Admitting: Podiatry

## 2024-04-09 DIAGNOSIS — Z91199 Patient's noncompliance with other medical treatment and regimen due to unspecified reason: Secondary | ICD-10-CM

## 2024-04-09 LAB — CUP PACEART REMOTE DEVICE CHECK
Battery Remaining Longevity: 3 mo — CL
Battery Remaining Percentage: 2 %
Brady Statistic RV Percent Paced: 0 %
Date Time Interrogation Session: 20250820093200
HighPow Impedance: 75 Ohm
Implantable Lead Connection Status: 753985
Implantable Lead Implant Date: 20050603
Implantable Lead Location: 753860
Implantable Lead Model: 185
Implantable Lead Serial Number: 116340
Implantable Pulse Generator Implant Date: 20110505
Lead Channel Impedance Value: 612 Ohm
Lead Channel Pacing Threshold Amplitude: 0.8 V
Lead Channel Pacing Threshold Pulse Width: 0.4 ms
Lead Channel Setting Pacing Amplitude: 2.4 V
Lead Channel Setting Pacing Pulse Width: 0.4 ms
Lead Channel Setting Sensing Sensitivity: 0.4 mV
Pulse Gen Serial Number: 266301
Zone Setting Status: 755011

## 2024-04-09 NOTE — Progress Notes (Signed)
 1. No-show for appointment

## 2024-04-13 ENCOUNTER — Ambulatory Visit (HOSPITAL_BASED_OUTPATIENT_CLINIC_OR_DEPARTMENT_OTHER)
Admission: RE | Admit: 2024-04-13 | Discharge: 2024-04-13 | Disposition: A | Discharge status: Home / Self Care | Source: Ambulatory Visit | Attending: Internal Medicine | Admitting: Internal Medicine

## 2024-04-13 ENCOUNTER — Encounter: Payer: Self-pay | Admitting: Speech Pathology

## 2024-04-13 ENCOUNTER — Ambulatory Visit: Payer: Self-pay | Admitting: Speech Pathology

## 2024-04-13 DIAGNOSIS — Z86711 Personal history of pulmonary embolism: Secondary | ICD-10-CM | POA: Insufficient documentation

## 2024-04-13 DIAGNOSIS — R053 Chronic cough: Secondary | ICD-10-CM | POA: Insufficient documentation

## 2024-04-13 DIAGNOSIS — Z5181 Encounter for therapeutic drug level monitoring: Secondary | ICD-10-CM | POA: Diagnosis present

## 2024-04-13 DIAGNOSIS — Z9109 Other allergy status, other than to drugs and biological substances: Secondary | ICD-10-CM | POA: Insufficient documentation

## 2024-04-13 DIAGNOSIS — R498 Other voice and resonance disorders: Secondary | ICD-10-CM | POA: Diagnosis not present

## 2024-04-13 DIAGNOSIS — R0989 Other specified symptoms and signs involving the circulatory and respiratory systems: Secondary | ICD-10-CM | POA: Diagnosis present

## 2024-04-13 DIAGNOSIS — J84112 Idiopathic pulmonary fibrosis: Secondary | ICD-10-CM | POA: Insufficient documentation

## 2024-04-13 DIAGNOSIS — G4733 Obstructive sleep apnea (adult) (pediatric): Secondary | ICD-10-CM | POA: Insufficient documentation

## 2024-04-13 DIAGNOSIS — I96 Gangrene, not elsewhere classified: Secondary | ICD-10-CM | POA: Diagnosis present

## 2024-04-13 NOTE — Therapy (Signed)
 OUTPATIENT SPEECH LANGUAGE PATHOLOGY VOICE TREATMENT   Patient Name: [name] MRN: [id number] DOB:1951-08-09, 73 y.o., female Today's Date: 04/13/2024  PCP: Leonce Carola PARAS PA-C REFERRING PROVIDER: Geronimo Amel, MD  END OF SESSION:  End of Session - 04/13/24 1016     Visit Number 2    Number of Visits 6    Date for SLP Re-Evaluation 06/29/24    SLP Start Time 1015    SLP Stop Time  1100    SLP Time Calculation (min) 45 min    Activity Tolerance Patient tolerated treatment well          Past Medical History:  Diagnosis Date   AICD (automatic cardioverter/defibrillator) present    [employer]   Allergy 09/20/1994   Anxiety    Arthritis    Back pain    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   Bronchiectasis (HCC)    Cerebral aneurysm, nonruptured    had a clip put in   CHF (congestive heart failure) (HCC)    Clostridium difficile infection    Depressive disorder, not elsewhere classified    Diverticulosis of colon (without mention of hemorrhage)    Esophageal reflux    Fatty liver    Fibromyalgia    Gastritis    GERD (gastroesophageal reflux disease)    GI bleed 2004   Glaucoma    Hiatal hernia    History of COVID-19 05/04/2021   Hyperlipidemia    Hypertension    Hypothyroidism    Internal hemorrhoids    Joint pain    Multifocal pneumonia 06/03/2021   Obstructive sleep apnea (adult) (pediatric)    Osteoarthritis    Ostium secundum type atrial septal defect    Other chronic nonalcoholic liver disease    Other pulmonary embolism and infarction    Palpitations    Paroxysmal ventricular tachycardia (HCC)    Personal history of radiation therapy    PONV (postoperative nausea and vomiting)    Presence of permanent cardiac pacemaker    PUD (peptic ulcer disease)    Radiation 02/03/15-03/10/15   Right Breast   Sarcoid    per pt , not sure   Schatzki's ring    Shortness of breath    with exertion - has oxygen prn  via [place] at 2 L   Sleep apnea    uses CPAP nightly   Stroke [facility]) 2013   tia/ pt feels it was around 2008 0r 2009   Takotsubo syndrome    Tubular adenoma of colon    Unspecified transient cerebral ischemia    Unspecified vitamin D  deficiency    Past Surgical History:  Procedure Laterality Date   ABDOMINAL HYSTERECTOMY  11/22/1998   ABI  2006   normal   BONE EXOSTOSIS EXCISION Right 04/02/2024   Procedure: EXCISION, EXOSTOSIS;  Surgeon: Kit Rush, MD;  Location: [facility] OR;  Service: Orthopedics;  Laterality: Right;   BREAST EXCISIONAL BIOPSY     BREAST LUMPECTOMY Right 2016   BREAST LUMPECTOMY WITH RADIOACTIVE SEED LOCALIZATION Right 01/03/2015   Procedure: BREAST LUMPECTOMY WITH RADIOACTIVE SEED LOCALIZATION;  Surgeon: Deward Null III, MD;  Location: [facility] OR;  Service: General;  Laterality: Right;   CARDIAC DEFIBRILLATOR PLACEMENT  2006; 2012   BSX single chamber ICD   carotid dopplers  2006   neg   CEREBRAL ANEURYSM REPAIR  02/1999   COLONOSCOPY     EYE SURGERY Right 02/2023   surgery center - for cataracts  HAMMER TOE SURGERY  11/15/2020   3rd digit bilateral feet    hospitalization  2004   GI bleed, PUD, diverticulosis (EGD,colonscopy)   hospitalization     PE, NSVT, s/p defib   JOINT REPLACEMENT  05/2021   KNEE ARTHROSCOPY     bilateral   LEFT HEART CATHETERIZATION WITH CORONARY ANGIOGRAM N/A 02/22/2012   Procedure: LEFT HEART CATHETERIZATION WITH CORONARY ANGIOGRAM;  Surgeon: Lonni [name] Cash, MD;  Location[facility] CATH LAB;  Service: Cardiovascular;  Laterality: N/A;   PARTIAL HYSTERECTOMY     Fibroids   TONGUE BIOPSY  12/12/2017   due to sore tongue and white patches/abnormal cells   TOTAL KNEE ARTHROPLASTY Left 02/13/2016   Procedure: TOTAL KNEE ARTHROPLASTY;  Surgeon: Marcey Raman, MD;  Location: [facility] OR;  Service: Orthopedics;  Laterality: Left;   TOTAL KNEE ARTHROPLASTY Right 05/22/2021   Procedure: TOTAL KNEE ARTHROPLASTY;  Surgeon: Raman Marcey, MD;  Location: [facility]  ORS;  Service: Orthopedics;  Laterality: Right;  with block   TUBAL LIGATION  09/20/1978   UPPER GASTROINTESTINAL ENDOSCOPY     Patient Active Problem List   Diagnosis Date Noted   Pulmonary embolism (HCC) 04/03/2023   Acute pulmonary embolism (HCC) 04/02/2023   COVID-19 virus infection 03/12/2023   IPF (idiopathic pulmonary fibrosis) (HCC) 02/25/2023   Bicipital tendinitis of left shoulder 11/09/2022   Partial symptomatic epilepsy with complex partial seizures, not intractable, without status epilepticus (HCC) 08/10/2022   Low back pain 08/10/2022   Gait abnormality 05/04/2022   Muscle spasm 05/04/2022   Upper airway cough syndrome 01/19/2022   Chronic left shoulder pain 11/13/2021   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   Localized swelling of right lower leg 08/29/2021   Tonsillar exudate 08/29/2021   Candida vaginitis 08/28/2021   Chronic pain syndrome 08/25/2021   Multifocal pneumonia 06/03/2021   Pre-operative respiratory examination 05/18/2021   Statin myopathy 04/19/2021   Pain in joint of right shoulder 02/10/2021   Arthritis of both acromioclavicular joints 01/02/2021   Trochanteric bursitis of right hip 10/03/2020   Tailbone injury 05/05/2020   UTI (urinary tract infection) 05/05/2020   Insomnia 05/05/2020   Leg cramps 02/16/2020   Dysuria 02/16/2020   Right hip impingement syndrome 02/01/2020   Right bicipital tenosynovitis 02/01/2020   Bronchiectasis without complication (HCC) 01/22/2020   Myofascial pain 12/21/2019   Estrogen deficiency 12/10/2019   Obesity (BMI 30-39.9) 12/10/2019   Bronchiectasis with (acute) exacerbation (HCC) 10/23/2019   Elevated LFTs 10/15/2019   Lower abdominal pain 09/01/2019   Neck pain 10/25/2017   Paresthesias in left hand 10/25/2017   Paresthesia of foot, bilateral 04/12/2017   Primary osteoarthritis of both hands 09/27/2016   Primary osteoarthritis of both knees 09/27/2016   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    Nasopharyngitis, 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/2016   S/P total knee replacement 02/13/2016   Chronic pain of both knees 12/06/2015   Globus pharyngeus 05/17/2015   Genetic testing 12/23/2014   Family history of breast cancer    Family history of colon cancer    Family history of pancreatic cancer    History of breast cancer 11/23/2014   Allergy to ACE inhibitors 09/27/2014   Prediabetes 06/23/2013   Muscle spasms of both lower extremities 01/15/2013   Hx of Clostridium difficile infection 10/10/2012   Dyspnea on exertion 04/02/2012   NICM (nonischemic cardiomyopathy) (HCC) 03/07/2012   Post-menopausal 01/11/2012   Atypical chest pain 11/07/2011   Obstructive sleep apnea 04/04/2010   V-tach (HCC) 01/03/2009   ICD  [employer]  Single chamber 01/03/2009   PFO (patent foramen ovale) 09/29/2008   Vitamin D  deficiency 08/17/2008   Hypothyroidism 11/18/2006   Hyperlipidemia 11/18/2006   Depression with anxiety 11/18/2006   Essential hypertension 11/18/2006   Mitral valve prolapse 11/18/2006   Cerebral aneurysm 11/18/2006   Allergic rhinitis 11/18/2006   GERD 11/18/2006   Diverticulosis of colon 11/18/2006   Fatty liver 11/18/2006   Fibromyalgia 11/18/2006    Onset date: 02/18/2024  REFERRING DIAG: G15.887 (ICD-10-CM) - IPF (idiopathic pulmonary fibrosis) (HCC) Z51.81 (ICD-10-CM) - Medication monitoring encounter G47.33 (ICD-10-CM) - OSA (obstructive sleep apnea) R05.3 (ICD-10-CM) - Chronic cough Z91.09 (ICD-10-CM) - House dust mite allergy R09.89 (ICD-10-CM) - Chronic throat clearing Z86.711 (ICD-10-CM) - History of pulmonary embolism  THERAPY DIAG:  Other voice and resonance disorders  Rationale for Evaluation and Treatment: Rehabilitation  SUBJECTIVE:   SUBJECTIVE STATEMENT: The night before last I had a coughing bout really bad - I didn't have my C PAP on Pt accompanied by: self  PERTINENT HISTORY: [name] 73 y.o. -returns for follow-up.  She says she is feeling  stable but she is mostly bothered by chronic cough with clearing of the throat for the last 2 months.  She states that when she is out of the house the cough is less.  She does have dust mite allergy and she says she is taking all the precautions but nevertheless when she is out of the house the cough is less.  She is also clearing the throat a lot.  She is asking for interventions.  But from a shortness of breath perspective she is stable.  She is tolerating Esbriet  well.  She had pulmonary function testing that shows a continued steady decline in DLCO  PAIN:  Are you having pain? Yes: NPRS scale: 8/10 Pain location: back Pain description: ache, pain Aggravating factors: laying on stomach Relieving factors: meds  FALLS: Has patient fallen in last 6 months? No, Number of falls: 0  LIVING ENVIRONMENT: Lives with: lives alone Lives in: House/apartment  PLOF:Level of assistance: Independent with ADLs, Independent with IADLs Employment: Retired  PATIENT GOALS:   OBJECTIVE:  Note: Objective measures were completed at Evaluation unless otherwise noted.   COGNITION: Overall cognitive status: Within functional limits for tasks assessed   SOCIAL HISTORY: Occupation: Retired Water  intake: optimal Caffeine/alcohol  intake: minimal Daily voice use: minimal  PERCEPTUAL VOICE ASSESSMENT: Voice quality: hoarse and low vocal intensity Vocal abuse: habitual throat clearing - 11x this session Resonance: normal Respiratory function: thoracic breathing  OBJECTIVE VOICE ASSESSMENT: Maximum phonation time for sustained ah: 9.46 Conversational loudness average: 66 dB Conversational loudness range: 62-68 dB (70dB is WNL) S/z ratio: 1.05 (Suggestive of dysfunction >1.4)  PATIENT REPORTED OUTCOME MEASURES (PROM): V-RQOL: 22 - she rated a 3 or moderate problem: speaking loudly and being heard, feeling frustrated due to her voice, having to repeat her self to be understood and rated a 2 for not  knowing what will come out when she starts speaking.  TREATMENT DATE:   04/13/24: [name] enters with low volume, (65dB) hoarse voice. She reports coughing episode night before last as she did not have C PAP on. Coughing during the day reduced - she continues on pregabalin  1x a day. [name] reports she has been reducing throat clearing and improving her awareness of throat clearing behavior. She is successfully swallowing instead of clearing her throat. Today, 1 throat clear with verbal cues to ID the throat clear. Targeted HEP for voice as clear voice achieve with high intensity vocal exercises - sustained Ha with 80-85dB for 5 seconds resulted in clear phonation 10/10 trial and occasional modeling and verbal cues. Pitch glides up and down, again starting using ha with clear phonation and usual min modeling, gestures and verbal cues for volume and to sustain high and low pitches. Reading in high and low pitch with increased vocal intensity with occasional modeling and verbal cues. Targeted increased vocal intensity generating 3 sentence descriptions with consistent modeling reduced hoarse voice to 40% of words uttered. [name] feels too loud when she achieves WNL volume. She required consistent verbal cues and imitating to ID when she speaks on residual volume and in low glottal fry.   04/06/24: Eval completed - initiated training in throat clear awareness and throat clear avoidance. Initially she required consistent mod A to ID throat clearing behavior, which improved to occasional min verbal and visual cues (tally [name]). After initial training, [name] demonstrated throat clear suppression with hard swallow, sniffs and/or sip 3/5x with usual mod A. Initiated training in cough suppression using gentle, relaxed sniff-sniff blow pattern and intermittent swallows or sips. She did not have  coughing episode this session, however she demonstrated these strategies with rare min A in isolation as practice. Education re: irritable larynx, LPR and post nasal drip provided   PATIENT EDUCATION: Education details: See Treatment, See patient instructions, reflux education, strategies to suppress cough and throat clear, vocal hygiene Person educated: Patient Education method: Explanation, Demonstration, Verbal cues, and Handouts Education comprehension: verbalized understanding, verbal cues required, and needs further education  HOME EXERCISE PROGRAM: TBD - this week focus on identifying and eliminating throat clearing behavior  GOALS: Goals reviewed with patient? Yes   LONG TERM GOALS: Target date: 06/29/24 - expecting 4-6 visits - extended date due to scheduling  Pt will clear her throat 2 or less times over 2 sessions Baseline:  Goal status: ONGOING  2.  Pt will complete HEP for voice with mod I Baseline:  Goal status: ONGOING  3.  Pt will use strategies to suppress or prevent cough with occasional min A over 2 sessions Baseline:  Goal status: ONGOING  4.  Pt will report 50% reduction in coughing subjectively Baseline:  Goal status: ONGOING  5.  Pt will achieve clear phonation and WNL volume 18/20 sentences Baseline:  Goal status: ONGOING  6.  Pt will maintain WNL volume and clear phonation over 8 minute conversation with rare min A Baseline:  Goal status: ONGOING  ASSESSMENT:  CLINICAL IMPRESSION: Patient is a 73 y.o. female who was seen today for chronic cough and hoarse voice. She reports voice has been hoarse for about 2 months. She is referred by pulmonologist for chronic cough and irritable larynx. She identifies perfume, scents, laughing, post nasal drip and smoke as triggers for her cough. She reports the cough in uncontrollable at times and garners unwanted attention. Others ask if she is sick or needs water . She has stopped reading at church due to voice  changes  and is unable to sing due to voice changes. She is on PPI in am and H2 blocker pm for GERD as well as  flonase  for nasal drip and allergies. She consistently clears her throat reporting globus sensation. She reports she has to repeat herself frequently due to voice changes. Her conversation volume is low (66dB) as well. Trials of high intensity voice exercises did result in improved voice quality indicating she is a good candidate for speech therapy. I recommend skilled ST to reduce sx of irritable larynx and improve intelligibility for safety, QOL.  OBJECTIVE IMPAIRMENTS: include voice disorder. These impairments are limiting patient from effectively communicating at home and in community. Factors affecting potential to achieve goals and functional outcome are n/a.SABRA Patient will benefit from skilled SLP services to address above impairments and improve overall function.  REHAB POTENTIAL: Good  PLAN:  SLP FREQUENCY: 1-2x/week  SLP DURATION: 12 weeks  PLANNED INTERVENTIONS: Diet toleration management , Environmental controls, Internal/external aids, Functional tasks, Multimodal communication approach, SLP instruction and feedback, Compensatory strategies, Patient/family education, [phone] Treatment of speech (30 or 45 min) , and [id number]- Speech Eval Behavioral Qualitative Voice Resonance    Mathis [name], CCC-SLP 04/13/2024, 11:08 AM

## 2024-04-13 NOTE — Patient Instructions (Signed)
   10 loud Ha! Minimum 5 seconds - goal - 10 seconds  10 pitch glides up, pause, then down  5x  Knoll low pitch Knoll mid [employer] high pitch  Read 10 sentences in a high pitch voice like you are calling to a neighbor over the fence  Read 10 sentences in a deep, low voice like you are the voice  Consider Reflux G[employer]met

## 2024-04-16 ENCOUNTER — Other Ambulatory Visit: Payer: Self-pay | Admitting: Internal Medicine

## 2024-04-17 ENCOUNTER — Ambulatory Visit: Admitting: Speech Pathology

## 2024-04-17 ENCOUNTER — Other Ambulatory Visit (HOSPITAL_COMMUNITY): Payer: Self-pay

## 2024-04-18 ENCOUNTER — Other Ambulatory Visit: Payer: Self-pay | Admitting: Internal Medicine

## 2024-04-18 DIAGNOSIS — I700 Atherosclerosis of aorta: Secondary | ICD-10-CM

## 2024-04-18 DIAGNOSIS — M609 Myositis, unspecified: Secondary | ICD-10-CM

## 2024-04-18 DIAGNOSIS — E785 Hyperlipidemia, unspecified: Secondary | ICD-10-CM

## 2024-04-22 NOTE — Progress Notes (Unsigned)
 OV 08/23/2022 -refer by Dr. Shellia to Dr. Geronimo at the pulmonary fibrosis center.  Subjective:  Patient ID: [name], female , DOB: 01-22-1951 , age 73 y.o. , MRN: [id number] , ADDRESS: [address] [place] [address] PCP Tanda Bleacher, MD Patient Care Team: Tanda Bleacher, MD as PCP - General (Family Medicine) Fernande Elspeth BROCKS, MD as PCP - Electrophysiology (Cardiology) Dann Candyce RAMAN, MD as PCP - Cardiology (Cardiology) Curvin Deward MOULD, MD as Consulting Physician (General Surgery) Lanny Callander, MD as Consulting Physician (Hematology) Keenan Hastings, MD as Consulting Physician (Radiation Oncology) Tyree Nanetta SAILOR, RN as Registered Nurse Glean Stephane BROCKS, RN as Registered Nurse Fernande Elspeth BROCKS, MD as Consulting Physician (Cardiology) Letha Truman ORN, NP (Inactive) as Nurse Practitioner (Nurse Practitioner) Moses Powell Hummer, NP as Nurse Practitioner (Nurse Practitioner) Shellia Oh, MD as Consulting Physician (Pulmonary Disease) Curvin Deward MOULD, MD as Consulting Physician (General Surgery) Dolphus Reiter, MD as Consulting Physician (Rheumatology)  This Provider for this visit: Treatment Team:  Attending Provider: Geronimo Amel, MD    08/23/2022 -   Chief Complaint  Patient presents with   Consult    Referred for ILD, states DOE     HPI [facility] 73 y.o. -referred by Dr. Shellia to Dr. Geronimo the fibrosis center.  History is gained by review of the chart, talking to the patient in the ILD questionnaire that the patient fell.  There is no one accompanying the patient.  She has been following with Dr. Shellia at least for the last 3 to 4 years for a diagnosis of bronchiectasis.  She says at baseline she is doing fairly well but then in October 2022 she suffered from COVID-19 and then was hospitalized.  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   Then a few weeks later on 06/03/2021 she was hospitalized for worsening respiratory failure which she believes was because of COVID-19 but review the records indicate that she actually had COVID in September 2022 and was treated with Paxlovid  as an outpatient.  At this hospitalization the COVID was negative.  She was actually hypoxemic.  Pulmonary embolism was ruled out.  This admission RVP and COVID were normal.  Procalcitonin was normal.  Final diagnosis was acute on chronic hypoxemic respiratory failure due to bronchiectasis and pulmonary fibrosis.  She says since then she has been gradually weaned of oxygen.  She says overall she is better since the hospitalization October 2022 but she is not back to her baseline from before the hospitalization.  She has dyspnea on exertion.  Most recently November 2023 Dr. Shellia did high-resolution CT scan of the chest that is concerning for honeycombing findings and progressive changes [personally visualized and I do not fully agree].  Therefore she has been referred here.  Pulmonary function testing shows still blood in DLCO but the FVC seems reduced recently.   All the question is as follows  Most recently she is use 2 L of oxygen with exertion.   [facility] [place] [facility] Comprehensive ILD Questionnaire  Symptoms:  Other than dyspnea she also has a cough.  The cough is a since December 2023.  Although currently it is not there.   Past Medical History :  -She marked positive for COPD according to history but does not not be correct. -She is have OSA for which she is previous Dr. Shellia - She does have a  longstanding history of acid reflux/hiatal hernia No history of tuberculosis -She has a history of breast cancer with radiation to the right breast in 2018 -Per the records she suffers from chronic pain syndrome with a history of right brain aneurysm clipping 2001 and cervical stenosis. -She has acid reflux disease for which she sees Dr. Albertus -She has  overactive bladder -She has nonischemic cardiomyopathy for which she sees Dr. Fernande  -EF 35-40% in 2013 but as high as 50-55% in December 2022   ROS:  She has longstanding chronic pain and gait abnormality  FAMILY HISTORY of LUNG DISEASE:  She has arthralgia - She does have dry eyes -She does have heartburn -Denies family history of lung disease  PERSONAL EXPOSURE HISTORY:  -Denies family history of lung disease -She smoked between 1966 in 89 1/2 pack/day.  No marijuana no cocaine no vaping  HOME  EXPOSURE and HOBBY DETAILS :  -Her current home was built in 1973 and she has been in this house since the last 25 years.  She does use a CPAP mask.  She does use a nebulizer machine.  She does do some occasional gardening but otherwise detailed organic antigen history is negative.  OCCUPATIONAL HISTORY (122 questions) : She is to work for [employer] and then got disabled and then retired.  Detail organic and inorganic antigen history exposure at work is negative.  PULMONARY TOXICITY HISTORY (27 items):  -Did get rid Dacian to the right breast in 2018  INVESTIGATIONS: -     CT Chest data - HRCT Nov 2023 -personally visualized.  Not fully sure if that is actually UIP or not.  If that is UIP then that negative prognostic marker.  I am also not sure about progression.  With this allowed to be clarified with multidisciplinary case conference.   Narrative & Impression  CLINICAL DATA:  73 year old female with history of shortness of breath. Evaluate for interstitial lung disease.   EXAM: CT CHEST WITHOUT CONTRAST   TECHNIQUE: Multidetector CT imaging of the chest was performed following the standard protocol without intravenous contrast. High resolution imaging of the lungs, as well as inspiratory and expiratory imaging, was performed.   RADIATION DOSE REDUCTION: This exam was performed according to the departmental dose-optimization program which includes automated exposure  control, adjustment of the mA and/or kV according to patient size and/or use of iterative reconstruction technique.   COMPARISON:  CTA of the chest 08/30/2021.   FINDINGS: Cardiovascular: Heart size is normal. There is no significant pericardial fluid, thickening or pericardial calcification. There is aortic atherosclerosis, as well as atherosclerosis of the great vessels of the mediastinum and the coronary arteries, including calcified atherosclerotic plaque in the left anterior descending coronary artery. Left-sided pacemaker/AICD device in place with lead tip terminating in the right ventricular apex.   Mediastinum/Nodes: No pathologically enlarged mediastinal or hilar lymph nodes. Please note that accurate exclusion of hilar adenopathy is limited on noncontrast CT scans. Esophagus is unremarkable in appearance. No axillary lymphadenopathy.   Lungs/Pleura: High-resolution images demonstrate widespread but patchy areas of ground-glass attenuation, septal thickening, subpleural reticulation, parenchymal banding, traction bronchiectasis, peripheral bronchiolectasis and extensive honeycombing. These findings have a definitive craniocaudal gradient and are progressive compared to the prior examination from 08/30/2021. Inspiratory and expiratory imaging is unremarkable. No acute confluent consolidative airspace disease. No pleural effusions. No definite suspicious appearing pulmonary nodules or masses are noted.   Upper Abdomen: Aortic atherosclerosis. Colonic diverticulosis noted in the splenic flexure of the colon.  Musculoskeletal: There are no aggressive appearing lytic or blastic lesions noted in the visualized portions of the skeleton.   IMPRESSION: 1. 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 2. Aortic atherosclerosis, in addition to left  anterior descending coronary artery disease. Assessment for potential risk factor modification, dietary therapy or pharmacologic therapy may be warranted, if clinically indicated. 3. Colonic diverticulosis without evidence of acute diverticulitis at this time.   Aortic Atherosclerosis (ICD10-I70.0).     Electronically Signed   By: Toribio Aye M.D.   On: 07/08/2022 10:54     OV 11/22/2022  Subjective:  Patient ID: [name], female , DOB: 1951/01/31 , age 40 y.o. , MRN: [id number] , ADDRESS: [address] [place] [address] PCP Tanda Bleacher, MD Patient Care Team: Tanda Bleacher, MD as PCP - General (Family Medicine) Fernande Elspeth BROCKS, MD as PCP - Electrophysiology (Cardiology) Dann Candyce RAMAN, MD as PCP - Cardiology (Cardiology) Curvin Deward MOULD, MD as Consulting Physician (General Surgery) Lanny Callander, MD as Consulting Physician (Hematology) Keenan Hastings, MD as Consulting Physician (Radiation Oncology) Tyree Nanetta SAILOR, RN as Registered Nurse Glean Stephane BROCKS, RN as Registered Nurse Fernande Elspeth BROCKS, MD as Consulting Physician (Cardiology) Letha Truman ORN, NP (Inactive) as Nurse Practitioner (Nurse Practitioner) Moses Powell Hummer, NP as Nurse Practitioner (Nurse Practitioner) Shellia Oh, MD as Consulting Physician (Pulmonary Disease) Curvin Deward MOULD, MD as Consulting Physician (General Surgery) Dolphus Reiter, MD as Consulting Physician (Rheumatology)  This Provider for this visit: Treatment Team:  Attending Provider: Geronimo Amel, MD    11/22/2022 -   Chief Complaint  Patient presents with   Follow-up    F/u pft 11/15/22. Labs,      HPI [name] 73 y.o. -returns for follow-up.  This is potential ILD workup in progress.  She states in the interim her son who is age 15 did have a heart transplant for congestive heart failure 3 weeks ago at [facility] he is home and is improving.  She is happy about that.  She  also reports that after her COVID she was prescribed Symbicort  she is run out of the Symbicort  for the last 2 weeks but this is not really bothering her.  She feels stable she has occasional cough.  Be here to review the results.  She had pulmonary function test that shows now a consistent pattern of decline her FVC is declined 9% in the last 4 years.  I went back and visualized all her CT scans of the chest.  Her CT scan in 2013 and a high-resolution CT chest in 2020 look about similar with some bronchiectasis.  However there is dramatic change between 2020 and also the high-resolution CT chest and late 2023.  This seems a lot more traction bronchiectasis.  There is definite craniocaudal gradient.  She has crackles here.  Initially I was not sure if this was classic UIP but clinically this makes sense especially of the lower lobe crackles.  Was supposed to discuss this in the case conference but this did not happen.  Every posted for discussion of the case conference but nevertheless his decline in PFTs and also this change now in the CT scan of the chest.  In the interim his serologies are normal.  I think at the least she has some plan for progressive ILD .  Possible is IPF but I would like to confirm this and discussion  of the case conference.  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  Went over the 2 antifibrotic's nintedanib and pirfenidone .  Shows pirfenidone  because of the diarrhea side effects with nintedanib.  She has knee pain and be hard to get to the toilet.  In addition she has nonischemic cardiomyopathy and this blackbox warning for MI risk with with nintedanib.  [Even though she does not have ischemic cardiomyopathy].  Told her that pirfenidone  associated more with weight loss nausea anorexia.  Both drugs required liver function monitoring.  Generally both drugs have reversible side effects.  In balance which was  pirfenidone .  I did note mild transaminitis in the past will check liver function test again today.   [id number]      OV 01/03/2023  Subjective:  Patient ID: [name], female , DOB: Dec 21, 1950 , age 88 y.o. , MRN: [id number] , ADDRESS: [address] [place] [address] PCP Tanda Bleacher, MD Patient Care Team: Tanda Bleacher, MD as PCP - General (Family Medicine) Fernande Elspeth BROCKS, MD as PCP - Electrophysiology (Cardiology) Dann Candyce RAMAN, MD as PCP - Cardiology (Cardiology) Curvin Deward MOULD, MD as Consulting Physician (General Surgery) Lanny Callander, MD as Consulting Physician (Hematology) Keenan Hastings, MD as Consulting Physician (Radiation Oncology) Tyree Nanetta SAILOR, RN as Registered Nurse Glean Stephane BROCKS, RN as Registered Nurse Fernande Elspeth BROCKS, MD as Consulting Physician (Cardiology) Letha Truman ORN, NP (Inactive) as Nurse Practitioner (Nurse Practitioner) Moses Powell Hummer, NP as Nurse Practitioner (Nurse Practitioner) Shellia Oh, MD as Consulting Physician (Pulmonary Disease) Curvin Deward MOULD, MD as Consulting Physician (General Surgery) Dolphus Reiter, MD as Consulting Physician (Rheumatology)  This Provider for this visit: Treatment Team:  Attending Provider: Geronimo Amel, MD    01/03/2023 -   Chief Complaint  Patient presents with   Follow-up    F/up wheezing, congestion, cough, sob with exertion.     HPI [name] 73 y.o. -returns for follow-up.  At this visit she was supposed to have started pirfenidone  but what happened is that she had abnormal liver enzymes at the time of last visit.  We found that she was taking a lot of Tylenol  for the knee pain.  She is now saying she did not take a lot but she actually did.  Her liver enzymes have since normalized as of December 07, 2022.  She is concerned about a high total protein.  She wanted to discuss this.  I did recommend to her that she  discuss this with primary care physician.  Then she brought her back pain.  I also suggested she discuss with primary care physician.  She wants repeat liver enzymes check especially because of total protein which she believes is because of the Repatha .  Meanwhile she continues to be symptomatic.  We discussed in the case conference in the results are below.  I did share these results with the patient.     MDD conference [name] 2024: Definitely progressive 2013 very mild non specific ILD. 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 NOv 2023: most rcent versus Feb 2020 HRCT. In msost recent Ct  basilar predmon, ILD, Subpleural reticuation with prominent TB. There is clear honeycombing (moderate) in lung base. No air trapping. Progressed sinc2020.  C/w UIP. In 2020: read as alternate diagnosis but Dr Leonce thinks in retrospect is mild to moderate  probable or c/w UIP with mild honeycombing. The bronchiectasis all traction bronchiectasis  of fibrosis does not need bipsy. Call it IPF. Rx as IPF   We then discussed the antifibrotic's again.  We went over nintedanib or pirfenidone  again.  We stuck with the original decision on taking pirfenidone  and she is committing to it.  I will send a note to pharmacy.  Will check liver function test today.  03/12/2023 Acute OV : Covid 19 infection  Presents for an acute office visit.  She complains of 1 week of cough, congestion, increased shortness of breath.  She tested positive for COVID-19 on March 08, 2023.  She was treated with a Z-Pak.  She has completed this in full.  Continues to have some ongoing mucus that is thick.  Denies any fever, hemoptysis, chest pain.  She remains on Symbicort  twice daily. She is feeling some better. Concerned that her mucus is still thick. Has nasal congestion and drainage. Still feels weak. Appetite is okay, no n/v/d.   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 She was started on Esbriet  but unable to start due to elevated liver enzymes . Was  seen by GI diagnosed with fatty liver. Has follow up next month with Dr. Geronimo for ILD.   Has sleep apnea on CPAP At bedtime . Does not wear oxygen at home.        OV 03/29/2023  Subjective:  Patient ID: [name], female , DOB: 1951-07-14 , age 26 y.o. , MRN: [id number] , ADDRESS: [address] [place] [address] PCP Lorren Greig PARAS, NP Patient Care Team: Lorren Greig PARAS, NP as PCP - General (Nurse Practitioner) Fernande Elspeth BROCKS, MD as PCP - Electrophysiology (Cardiology) Dann Candyce RAMAN, MD as PCP - Cardiology (Cardiology) Curvin Deward MOULD, MD as Consulting Physician (General Surgery) Lanny Callander, MD as Consulting Physician (Hematology) Keenan Hastings, MD as Consulting Physician (Radiation Oncology) Tyree Nanetta SAILOR, RN as Registered Nurse Glean Stephane BROCKS, RN as Registered Nurse Fernande Elspeth BROCKS, MD as Consulting Physician (Cardiology) Letha Truman ORN, NP (Inactive) as Nurse Practitioner (Nurse Practitioner) Moses Powell Hummer, NP as Nurse Practitioner (Nurse Practitioner) Shellia Oh, MD as Consulting Physician (Pulmonary Disease) Curvin Deward MOULD, MD as Consulting Physician (General Surgery) Dolphus Reiter, MD as Consulting Physician (Rheumatology)  This Provider for this visit: Treatment Team:  Attending Provider: Geronimo Amel, MD    03/29/2023 -   Chief Complaint  Patient presents with   Acute Visit    Cough but not productive, runny nose.     HPI [name] 73 y.o. -acute visit for this IPF patient who has not been able to start antifibrotic's because of elevated liver enzymes.  Since her last visit she did see Dr. Sheran Phoenix who could not find an identifiable etiology for the elevated liver enzymes advised a low carbohydrate diet which she has been following.  She comes in because she is acutely ill.  She tells me that she did see Madelin Passy March 12, 2023 leory reviewed] had acute COVID-19..  Now she is calling  acutely for cough.  She says cough is getting worse for the last 2 days.  This headaches are some back pressure there is also shortness of breath for 2 days she feels some chills but no fever no sputum production.  Therefore she is here.  She does use oxygen at night.  In the past have tried to do excess hypoxemia test but unable to because of her knee issue.  Today she was able to do and she did desaturate to 81%  on a sit/stand test after doing it 10 times.  But I do not have a baseline to compare to.   TEST/EVENTS :  PFT 04/17/12 >> FEV1 2.60 (110%), FEV1% 85, TLC 4.05 (77%), DLCO 44% PFT 01/17/17 >> FEV1 2.38 (111%), FEV1% 90, TLC 3.85 (71%), DLCO 58%, no BD RAST 05/30/17 >> dust mites PFT 10/16/18 >> FEV1 2.27 (111%), FEV1% 90, TLC 4.00 (74%), DLCO 59% PFT 06/08/22 >> FEV1 2.20 (91%), FEV1% 93, TLC 3.76 (69%), DLCO 62%   Serology:  04/02/12 >> ACE 1 10/25/15 >> ESR 40, ACE 32 05/30/17 >> IgA, IgG, IgM normal 10/16/18 >> HP panel negative, ACE 25   Chest Imaging:  CT chest 04/10/12 >> b/l lower lobe cylindrical BTX and some in upper lobes CT angio chest 10/26/15 >> patchy GGO in periphery of lungs b/l, basilar BTX, no PE; no significant change compared to 2013 HRCT chest 10/08/18 >> atherosclerosis, basilar predominant cylindrical BTX, mild centrilobular/paraseptal emphysema, air trapping CT angio chest 03/24/19 >> Chronic lung changes with peribronchial thickening, bibasilar atelectasis and chronic interstitial disease/peripheral fibrosis at the lung bases CT angio chest 06/03/21 >> scattered GGO with fibrotic changes CT angio chest 08/30/21 >> BTX with emphysema and fibrotic changes in periphery HRCT 06/2022 -Progressive ILD-dx of UIP    Sleep Tests:  PSG 11/29/08 >> AHI 9 HST 11/10/15 >> AHI 7.1, SaO2 low 75% CPAP 06/17/21 to 07/16/21 >> used on 27 of 30 nights with average 5 hrs 24 min.  Average AHI 2.5 with CPAP 5 cm H2O.   Cardiac Tests:  Echo 08/11/21 >> EF 50 to 55%, RVSP 39.5  mmHg  PFT  OV 04/11/2023  Subjective:  Patient ID: [name], female , DOB: 01-02-1951 , age 39 y.o. , MRN: [id number] , ADDRESS: [address] [place] [address] PCP Lorren Greig PARAS, NP Patient Care Team: Lorren Greig PARAS, NP as PCP - General (Nurse Practitioner) Fernande Elspeth BROCKS, MD as PCP - Electrophysiology (Cardiology) Dann Candyce RAMAN, MD as PCP - Cardiology (Cardiology) Curvin Deward MOULD, MD as Consulting Physician (General Surgery) Lanny Callander, MD as Consulting Physician (Hematology) Keenan Hastings, MD as Consulting Physician (Radiation Oncology) Tyree Nanetta SAILOR, RN as Registered Nurse Glean Stephane BROCKS, RN as Registered Nurse Fernande Elspeth BROCKS, MD as Consulting Physician (Cardiology) Letha Truman ORN, NP (Inactive) as Nurse Practitioner (Nurse Practitioner) Moses Powell Hummer, NP as Nurse Practitioner (Nurse Practitioner) Shellia Oh, MD as Consulting Physician (Pulmonary Disease) Curvin Deward MOULD, MD as Consulting Physician (General Surgery) Dolphus Reiter, MD as Consulting Physician (Rheumatology)  This Provider for this visit: Treatment Team:  Attending Provider: Geronimo Amel, MD    04/11/2023 -   Chief Complaint  Patient presents with   Follow-up    F/up on hosp visit, IPF and blood work   HPI [name] 73 y.o. -returns for follow-up.  She was seen acutely in July 2020 for outpatient COVID then in August 2024 she came in acutely what look like bronchitis ended up turning out to be pulmonary embolism.  She did get admitted.  She has been discharged on Pradaxa  because Eliquis  was expensive.  She is using oxygen 2 L nasal cannula she feels better but he does get tachycardic.  She is worried about the PE.  She is worried about the duration of Pradaxa  and the cost associated with it and previous bleeding risk she says she had a bleeding episode 20 years ago.  I did indicate to her that potentially lifelong treatment because a  second  episode versus at least taking for a year and then reassessing.  In terms of IPF LFTs most recently mid August 2024 are normal.  She is agreed to start pirfenidone .  We did a sit/stand hypoxemia test and she did desaturate again.  I did indicate to her this might not be the PE make her desaturate but the IPF.  She asked about transplant I did indicate to her the 47 is at the upper limit plus she has got obesity.  Did indicate to her that we need to make progress with weight loss and then subsequently we went with the basics of IPF management and then we can decide.  She is using nighttime oxygen on her own.  But is never been tested.  Sleep study was ordered before she saw Dr. MALVA but because of illness this was canceled.  We will reorder this.  OV 05/23/2023  Subjective:  Patient ID: [name], female , DOB: 1951-02-07 , age 40 y.o. , MRN: [id number] , ADDRESS: [address] [place] [address] PCP Lorren Greig PARAS, NP Patient Care Team: Lorren Greig PARAS, NP as PCP - General (Nurse Practitioner) Fernande Elspeth BROCKS, MD as PCP - Electrophysiology (Cardiology) Dann Candyce RAMAN, MD as PCP - Cardiology (Cardiology) Curvin Deward MOULD, MD as Consulting Physician (General Surgery) Lanny Callander, MD as Consulting Physician (Hematology) Keenan Hastings, MD as Consulting Physician (Radiation Oncology) Tyree Nanetta SAILOR, RN as Registered Nurse Glean Stephane BROCKS, RN as Registered Nurse Fernande Elspeth BROCKS, MD as Consulting Physician (Cardiology) Letha Truman ORN, NP (Inactive) as Nurse Practitioner (Nurse Practitioner) Moses Powell Hummer, NP as Nurse Practitioner (Nurse Practitioner) Shellia Oh, MD (Inactive) as Consulting Physician (Pulmonary Disease) Curvin Deward MOULD, MD as Consulting Physician (General Surgery) Dolphus Reiter, MD as Consulting Physician (Rheumatology)  This Provider for this visit: Treatment Team:  Attending Provider: Geronimo Amel, MD    05/23/2023 -   Chief  Complaint  Patient presents with   Followup     She has occ dizzy spells- occurs while at rest. She feels like her breathing has improved since the last visit. She notices rattling in chest at night. Her cough is mainly non prod.      HPI [name] 73 y.o. 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  She finally got a pirfenidone  mid September 2024 based on chart review.  Today she goes up to 3 pills 3 times daily.  She initially said she was tolerating pirfenidone  well but then she admitted that ever since she started the pirfenidone  she is getting dizzy spells.  These are very transient split-second.  They happen spontaneously.  2-3 times a day.  Could be standing or sitting or lying down.  She does not lose any consciousness.  No focal deficit no seizures no falls no double vision no fever no chills but definitely new definitely mild and definitely since starting pirfenidone .  She is not compliant with her oxygen use.  However her exercise hypoxemia test is better in fact her overall shortness of breath with exertion is better. Want to rehab  # Pulmonary embolism August 2024 she continues Pradaxa .  She tells me now for the first time that ever since starting Pradaxa  she is having intense throat burning despite her being on PPI and H2 blockade.  At this straight after taking Pradaxa  does not think that with pirfenidone .  Review of Pradaxa  side effects is that greater than 25% GI side effects.  She could not afford Eliquis  but she is willing to see if this is now affordable.  Meanwhile we will give her a GI cocktail.  I have sent a message to the pharmacy team  #Sleep apnea she is under the care of Dr. MALVA   #Other issues.  She will have a high-dose flu shot today.  I also told her that is okay to travel by air.     APP VISIT 06/21/23   History of Present Illness   Today's video visit is 1 month follow-up.  She is followed for IPF.  She has been started on  Esbriet  but during last visit was having dizziness felt possibly secondary to Esbriet .  Esbriet  was placed on hold until October 12.  She was to restart Esbriet  and titrate up slowly as tolerable.  She was also referred to pulmonary rehab.  She would also recommend to continue on oxygen 2 L with activity The patient had previously been advised to stop taking Esbriet  due to concerns it was causing dizziness. The patient confirmed that the dizziness resolved after discontinuing the medication, but was unaware of the instruction to restart the medication on October 13th. The patient had previously reached the top dose of Esbriet  (three capsules three times daily) . Dizziness did start at lower doses.   In addition to fibrosis, the patient was also dealing with issues related to Pradaxa , a blood thinner, which was causing throat burning. The patient confirmed that switching to Eliquis  resolved this issue.  She denies any known bleeding . Education on not using NSAIDS.   The patient is using oxygen with activity and during physical therapy, as her oxygen level sometimes dropped to as low as 88. The patient is taking Symbicort  twice daily and using albuterol  infrequently. The patient was not very active outside of physical therapy and housework. Likes pulmonary rehab alot.   The patient is  also using a CPAP machine at bedtime and reported feeling rested with its use. The patient had recently been in the ER for chest pain . Workup was unrevealing with neg CT chest for PE or acute process. Labs unrevealing. plans to follow up with cardiology .    The patient had received a pneumonia vaccine in 2022 and was up to date on RSV vaccines.        OV 10/28/2023  Subjective:  Patient ID: [name], female , DOB: 12/25/1950 , age 78 y.o. , MRN: [id number] , ADDRESS: [address] [place] [address] PCP Leonce Carola PARAS, PA-C Patient Care Team: Leonce Carola PARAS DEVONNA as PCP - General  (Pediatrics) Fernande Elspeth BROCKS, MD as PCP - Electrophysiology (Cardiology) Dann Candyce RAMAN, MD as PCP - Cardiology (Cardiology) Curvin Deward MOULD, MD as Consulting Physician (General Surgery) Lanny Callander, MD as Consulting Physician (Hematology) Keenan Hastings, MD as Consulting Physician (Radiation Oncology) Tyree Nanetta SAILOR, RN as Registered Nurse Glean Stephane BROCKS, RN as Registered Nurse Fernande Elspeth BROCKS, MD as Consulting Physician (Cardiology) Letha Truman ORN, NP (Inactive) as Nurse Practitioner (Nurse Practitioner) Moses Powell Hummer, NP as Nurse Practitioner (Nurse Practitioner) Shellia Oh, MD (Inactive) as Consulting Physician (Pulmonary Disease) Curvin Deward MOULD, MD as Consulting Physician (General Surgery) Dolphus Reiter, MD as Consulting Physician (Rheumatology)  This Provider for this visit: Treatment Team:  Attending Provider: Geronimo Amel, MD    10/28/2023 -   Chief Complaint  Patient presents with   Follow-up    She c/o wheezing, esp at night x 2 months. Breathing  has been stable. She has nocturnal cough- non prod.     HPI [name] 73 y.o. -returns for follow-up.  I last saw her in the fall 2024.  At that time she had some dizziness with pirfenidone  we stopped the pirfenidone  and then she saw a nurse practitioner.  Nurse practitioner put her on a rechallenge of pirfenidone .  She is currently on full dose pirfenidone  and she is tolerating it well without any dizziness.  She reports stable dyspnea.  She reports good tolerance with pirfenidone  except mild nausea that is transient.  She had pulmonary function test in March 2025 and it is stable.  Her main issue is that she is noticing nocturnal wheeze when she lies down and is in the sofa or in the bed.  However when I played the sound of her wheezing and crackles on YouTube she reported that it was crackles.  She had been reassured by the sleep specialist that this was consistent with the pulmonary  fibrosis.  I shared the same opinion.  However lab review shows that in the past in 2018 she had house dust mite allergy.  She is also on Singulair , Symbicort  and antihistamines at this point.  I did indicate to her we will check a CBC differential and blood IgE.  Also gave advice on house dust mite control.  Of note in January 2025 she ended up in the ER with enteritis and had elevated liver enzyme.  She requires a follow-up today.  Also of note she wants to know if she can drink alcohol .  I did tell her that we need the liver enzymes checked.  She is also on oxycodone .  I told her that multiple agents that can increase risk of liver injury.  She wanted to know if she could to a marijuana gummy I advised her against this.      OV 02/18/2024  Subjective:  Patient ID: [name], female , DOB: [birth year] , age 44 y.o. , MRN: [id number] , ADDRESS: [address] [place] [address] PCP Leonce Carola PARAS, PA-C Patient Care Team: Leonce Carola PARAS DEVONNA as PCP - General (Pediatrics) Cindie Ole DASEN, MD as PCP - Electrophysiology (Cardiology) Curvin Deward MOULD, MD as Consulting Physician (General Surgery) Lanny Callander, MD as Consulting Physician (Hematology) Keenan Hastings, MD as Consulting Physician (Radiation Oncology) Tyree Nanetta SAILOR, RN as Registered Nurse Glean Stephane BROCKS, RN (Inactive) as Registered Nurse Fernande Elspeth BROCKS, MD as Consulting Physician (Cardiology) Letha Truman ORN, NP (Inactive) as Nurse Practitioner (Nurse Practitioner) Moses Powell Hummer, NP as Nurse Practitioner (Nurse Practitioner) Shellia Oh, MD (Inactive) as Consulting Physician (Pulmonary Disease) Curvin Deward MOULD, MD as Consulting Physician (General Surgery) Dolphus Reiter, MD as Consulting Physician (Rheumatology)  This Provider for this visit: Treatment Team:  Attending Provider: Geronimo Amel, MD    02/18/2024 -   Chief Complaint  Patient presents with   Follow-up    Breathing  is stable. She does c/o increased cough for the past 6-8 wks- non prod.    HPI [name] 73 y.o. -returns for follow-up.  She says she is feeling stable but she is mostly bothered by chronic cough with clearing of the throat for the last 2 months.  She states that when she is out of the house the cough is less.  She does have dust mite allergy and she says she is taking all the precautions but nevertheless when she is out of the house the cough is less.  She is also clearing the throat a lot.  She is asking for interventions.  But from a shortness of breath perspective she is stable.  She is tolerating Esbriet  well.  She had pulmonary function testing that shows a continued steady decline in DLCO.  Echocardiogram June 2025 with her pulmonary hypertension.  Last high-res CT was November 2023.  Last CT angiogram chest was August 2024 at the time of pulmonary embolism [post COVID] in terms of PE she is continuing her full dose Eliquis  without any problems 1 year since anticoagulation is coming up.  Despite the DLCO showing decline in her symptom burden and her excess hypoxemia test actually stable.  She is willing to get a high-resolution CT chest for monitoring purposes.  She will also need a D-dimer and BNP to assess for anticoagulation monitoring and also any evidence of pulmonary hypertension.  New issue is that her right toe is black.  She has seen the podiatrist and a biopsy is pending she wanted clearance.        OV 04/23/2024  Subjective:  Patient ID: [name], female , DOB: 1951-08-15 , age 25 y.o. , MRN: [id number] , ADDRESS: [address] [place] [address] PCP Leonce Carola PARAS, PA-C Patient Care Team: Leonce Carola PARAS DEVONNA as PCP - General (Pediatrics) Cindie Ole DASEN, MD as PCP - Electrophysiology (Cardiology) Curvin Deward MOULD, MD as Consulting Physician (General Surgery) Lanny Callander, MD as Consulting Physician (Hematology) Keenan Hastings, MD as  Consulting Physician (Radiation Oncology) Tyree Nanetta SAILOR, RN as Registered Nurse Glean Stephane BROCKS, RN (Inactive) as Registered Nurse Fernande Elspeth BROCKS, MD as Consulting Physician (Cardiology) Letha Truman ORN, NP (Inactive) as Nurse Practitioner (Nurse Practitioner) Moses Powell Hummer, NP as Nurse Practitioner (Nurse Practitioner) Shellia Oh, MD (Inactive) as Consulting Physician (Pulmonary Disease) Curvin Deward MOULD, MD as Consulting Physician (General Surgery) Dolphus Reiter, MD as Consulting Physician (Rheumatology)  This Provider for this visit: Treatment Team:  Attending Provider: Geronimo Amel, MD    04/23/2024 -   Chief Complaint  Patient presents with   Medical Management of Chronic Issues   Interstitial Lung Disease    Breathing has been stable. No new co's.      #IPF patient unable to start pirfenidone  because of previous elevations in liver enzymes deemed as fatty liver by GI  - LAST HRCT Nov 2023  - Last CT (angion) aug 2024\   - aug 2025 with prgression   #Outpatient COVID-19 in July 2024 followed by pulmonary embolism mid August 2024.  [Previous history of VTE]  -Pradaxa  changed to Eliquis  in fall 2024 after burning in the throat n ddonig well  #Sleep apnea in the process of transfer from Dr. Shellia to Dr. MALVA  #Esbriet /Pirfenidone  requires intensive drug monitoring due to high concerns for Adverse effects of , including  Drug Induced Liver Injury, significant GI side effects that include but not limited to Diarrhea, Nausea, Vomiting,  and other system side effects that include Fatigue, headaches, weight loss and other side effects such as skin rash. These will be monitored with  blood work such as LFT initially once a month for 6 months and then quarterly On   - ELEVated LFT on and off  - Last Jan 2025  #Nonischemic cardiomyopathy ejection fraction 45-50% even in 2021 with normal cardiac stress test most recent echo late 2024.  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 -returns for follow-up.  Since her last visit she has had toe surgery and for her history no malignancy was found.  She did not have any excision of the toe.  Reviewed medical records indicate she had this on April 02, 2024.  Otherwise no changes in symptoms.  Tolerating pirfenidone  well.  The main question at this visit is if she is having pulmonary hypertension or worsening pulmonary fibrosis.  She did have an echocardiogram in June 2025 that showed evidence of pulmonary hypertension.  In addition this is a significant drop in DLCO.  Both parameters suggest pulmonary hypertension but the BNP at last visit was normal.  Without a CT scan therefore.  I personally visualized the this also enlarged pulmonary trace.  Therefore the concern is both worsening pulmonary fibrosis and onset of pulmonary hypertension.     SYMPTOM SCALE - ILD 08/23/2022 11/22/2022  05/23/2023  10/28/2023 Esbriet . 02/18/2024 esbiret 04/23/2024   Current weight    Pirfenidone  -today goes to 3 pills 3 times daily     O2 use x ra ra ra ra ra  Shortness of Breath 0 -> 5 scale with 5 being worst (score 6 If unable to do)       At rest 0 2 1 1 1 1   Simple tasks - showers, clothes change, eating, shaving 2 3 3 3 3 2   Household (dishes, doing bed, laundry) 1 4 3 3 3 2   Shopping 1 3 3 2 2 2   Walking level at own pace 2 3 3 2 1 2   Walking up Stairs 3 5 5 5 4 4   Total (30-36) Dyspnea Score 9 20 18 16 14 13     Non-dyspnea symptoms (0-> 5 scale) 08/23/2022 01/03/2023  05/23/2023  10/28/2023  02/18/2024  04/23/2024   How bad is your cough? 0 3 3 2 3 2   How bad is your fatigue 0 2 3 1 2 2   How bad is nausea 00 0 0 0 0  0  How bad is vomiting?  0 0 0 0 0 0  How bad is diarrhea? 0 0 0 1 0 0  How bad is anxiety? 2 4 3 2 2 2   How bad is depression 2 5 4 2 1 2   Any chronic pain - if so where and how bad 1 x do spirometry and x Okay BLACK RIGHT TOE Jardiance  -vaginal itch      SIT STAND TEST - goal 15 times   02/18/2024    O2 used ra   PRobe - finter or forehead finger   Number sit and stand completed - goal 15 15   Time taken to complete 15 sec   Resting Pulse Ox/HR/Dyspnea  96% and 83/min and dyspnea of 2/10    Peak measures 95 % and 103/min and dyspnea of 4/10   Final Pulse Ox/HR 91% and 94/min and dyspnea of 2/10   Desaturated </= 88% no   Desaturated <= 3% points yes   Got Tachycardic >/= 90/min yes   Miscellaneous comments Level 4 dyspnea    CT Chest data from date: 04/13/24  - personally visualized and independently interpreted : ye - my findings are: below Narrative & Impression  CLINICAL DATA:  Pulmonary  fibrosis.   EXAM: CT CHEST WITHOUT CONTRAST   TECHNIQUE: Multidetector CT imaging of the chest was performed following the standard protocol without intravenous contrast. High resolution imaging of the lungs, as well as inspiratory and expiratory imaging, was performed.   RADIATION DOSE REDUCTION: This exam was performed according to the departmental dose-optimization program which includes automated exposure control, adjustment of the mA and/or kV according to patient size and/or use of iterative reconstruction technique.   COMPARISON:  06/17/2023, 07/06/2022.   FINDINGS: Cardiovascular: Atherosclerotic calcification of the aorta and coronary arteries. Enlarged pulmonic trunk and heart. No pericardial effusion.   Mediastinum/Nodes: Thoracic inlet lymph nodes are not enlarged by CT size criteria. No pathologically enlarged mediastinal or axillary lymph nodes. Hilar regions are difficult to definitively evaluate without IV contrast. Lumpectomy clips in the right breast.  Esophagus is grossly unremarkable.   Lungs/Pleura: Peripheral and basilar predominant subpleural reticulation, coarsened ground-glass and traction bronchiectasis/bronchiolectasis with basilar honeycombing, stable to minimally progressive from 06/17/2023 and clearly progressive from 07/06/2022. No pleural fluid. Airway is unremarkable. Mild air trapping.   Upper Abdomen: Visualized portions of the liver, adrenal glands, kidneys, spleen, pancreas, stomach and bowel are grossly unremarkable. No upper abdominal adenopathy.   Musculoskeletal: Degenerative changes in the spine.   IMPRESSION: 1. Pulmonary parenchymal pattern of interstitial lung disease, as detailed above, similar to minimally progressive from 06/17/2023 and definitely progressive from 07/06/2022. Findings are consistent with UIP per consensus guidelines: Diagnosis of Idiopathic Pulmonary Fibrosis: An Official ATS/ERS/JRS/ALAT Clinical Practice Guideline. Am JINNY [employer] Crit Care Med Vol 198, Iss 5, [id number], Apr 20 2017. 2. Aortic atherosclerosis (ICD10-I70.0). Coronary artery calcification. 3. Enlarged pulmonic trunk, indicative of pulmonary arterial hypertension.     Electronically Signed   By: Newell Eke M.D.   On: 04/14/2024 17:26      ECHO June 2025   IMPRESSIONS     1. Left ventricular ejection fraction, by estimation, is 45 to 50%. The  left ventricle has mildly decreased function. The left ventricle  demonstrates global hypokinesis. There is mild concentric left ventricular  hypertrophy. Left ventricular diastolic  parameters are consistent with Grade I diastolic dysfunction (impaired  relaxation).   2. Right ventricular systolic function is normal. The right ventricular  size is normal. There is mildly elevated pulmonary artery systolic  pressure.   3. The mitral valve is normal in structure. Trivial mitral valve  regurgitation. No evidence of mitral stenosis.   4. The aortic valve is  tricuspid. Aortic valve regurgitation is not  visualized. No aortic stenosis is present.   5. The inferior vena cava is normal in size with greater than 50%  respiratory variability, suggesting right atrial pressure of 3 mmHg   PFT     Latest Ref Rng & Units 02/12/2024    1:54 PM 10/22/2023   11:01 AM 07/15/2023    1:56 PM 02/19/2023   11:26 AM 08/24/2022    1:55 PM 06/08/2022    3:49 PM 10/16/2018   12:55 PM  PFT Results  FVC-Pre L 2.27  2.26  2.30  2.25  2.30  2.33  2.52   FVC-Predicted Pre % 71  70  72  71  72  73  96   FVC-Post L      2.36  2.49   FVC-Predicted Post %      74  94   Pre FEV1/FVC % % 85  94  91  88  92  93  90   Post FEV1/FCV % %  93  91   FEV1-Pre L 1.93  2.13  2.08  1.97  2.12  2.17  2.27   FEV1-Predicted Pre % 80  88  87  82  88  90  111   FEV1-Post L      2.20  2.26   DLCO uncorrected ml/min/mmHg 8.97  15.53  10.02  11.85  11.05  12.95  12.43   DLCO UNC% % 42  73  48  56  52  62  59   DLCO corrected ml/min/mmHg  15.05  10.14  11.85  11.05  12.95    DLCO COR %Predicted %  71  48  56  52  62    DLVA Predicted % 70  103  81  94  87  93  85   TLC L      3.76  4.00   TLC % Predicted %      69  74   RV % Predicted %      55  58       Latest Reference Range & Units 03/29/23 13:33 02/18/24 16:01  Pro B Natriuretic peptide (BNP) 0.0 - 100.0 pg/mL 84.0 46.0    LAB RESULTS last 96 hours No results found.       has a past medical history of AICD (automatic cardioverter/defibrillator) present, Allergy (09/20/1994), Anxiety, Arthritis, Back pain, Breast cancer (HCC) (2016), Bronchiectasis (HCC), Cerebral aneurysm, nonruptured, CHF (congestive heart failure) (HCC), Clostridium difficile infection, Depressive disorder, not elsewhere classified, Diverticulosis of colon (without mention of hemorrhage), Esophageal reflux, Fatty liver, Fibromyalgia, Gastritis, GERD (gastroesophageal reflux disease), GI bleed (2004), Glaucoma, Hiatal hernia, History of COVID-19  (05/04/2021), Hyperlipidemia, Hypertension, Hypothyroidism, Internal hemorrhoids, Joint pain, Multifocal pneumonia (06/03/2021), Obstructive sleep apnea (adult) (pediatric), Osteoarthritis, Ostium secundum type atrial septal defect, Other chronic nonalcoholic liver disease, Other pulmonary embolism and infarction, Palpitations, Paroxysmal ventricular tachycardia (HCC), Personal history of radiation therapy, PONV (postoperative nausea and vomiting), Presence of permanent cardiac pacemaker, PUD (peptic ulcer disease), Radiation (02/03/15-03/10/15), Sarcoid, Schatzki's ring, Shortness of breath, Sleep apnea, Stroke (HCC) (2013), Takotsubo syndrome, Tubular adenoma of colon, Unspecified transient cerebral ischemia, and Unspecified vitamin D  deficiency.   reports that she quit smoking about 34 years ago. Her smoking use included cigarettes. She started smoking about 54 years ago. She has a 20 pack-year smoking history. She has been exposed to tobacco smoke. She has never used smokeless tobacco.  Past Surgical History:  Procedure Laterality Date   ABDOMINAL HYSTERECTOMY  11/22/1998   ABI  2006   normal   BONE EXOSTOSIS EXCISION Right 04/02/2024   Procedure: EXCISION, EXOSTOSIS;  Surgeon: Kit Rush, MD;  Location: [facility] OR;  Service: Orthopedics;  Laterality: Right;   BREAST EXCISIONAL BIOPSY     BREAST LUMPECTOMY Right 2016   BREAST LUMPECTOMY WITH RADIOACTIVE SEED LOCALIZATION Right 01/03/2015   Procedure: BREAST LUMPECTOMY WITH RADIOACTIVE SEED LOCALIZATION;  Surgeon: Deward Null III, MD;  Location: [facility] OR;  Service: General;  Laterality: Right;   CARDIAC DEFIBRILLATOR PLACEMENT  2006; 2012   BSX single chamber ICD   carotid dopplers  2006   neg   CEREBRAL ANEURYSM REPAIR  02/1999   COLONOSCOPY     EYE SURGERY Right 02/2023   surgery center - for cataracts   HAMMER TOE SURGERY  11/15/2020   3rd digit bilateral feet    hospitalization  2004   GI bleed, PUD, diverticulosis (EGD,colonscopy)    hospitalization     PE, NSVT, s/p defib   JOINT  REPLACEMENT  05/2021   KNEE ARTHROSCOPY     bilateral   LEFT HEART CATHETERIZATION WITH CORONARY ANGIOGRAM N/A 02/22/2012   Procedure: LEFT HEART CATHETERIZATION WITH CORONARY ANGIOGRAM;  Surgeon: Lonni [name] Cash, MD;  Location[facility] CATH LAB;  Service: Cardiovascular;  Laterality: N/A;   PARTIAL HYSTERECTOMY     Fibroids   TONGUE BIOPSY  12/12/2017   due to sore tongue and white patches/abnormal cells   TOTAL KNEE ARTHROPLASTY Left 02/13/2016   Procedure: TOTAL KNEE ARTHROPLASTY;  Surgeon: Marcey Raman, MD;  Location: [facility] OR;  Service: Orthopedics;  Laterality: Left;   TOTAL KNEE ARTHROPLASTY Right 05/22/2021   Procedure: TOTAL KNEE ARTHROPLASTY;  Surgeon: Raman Marcey, MD;  Location: [facility] ORS;  Service: Orthopedics;  Laterality: Right;  with block   TUBAL LIGATION  09/20/1978   UPPER GASTROINTESTINAL ENDOSCOPY      Allergies  Allergen Reactions   Ace Inhibitors Swelling    Angioedema; makes tongue break out    Amitriptyline Hcl Other (See Comments)     makes her too sleepy!   Atorvastatin Other (See Comments)    SEVERE MYALGIA   Dilantin [Phenytoin Sodium Extended] Rash    Severe rash   Duloxetine  Hcl Nausea Only and Other (See Comments)    Sleepiness/ sick  Other Reaction(s): GI Intolerance   Paroxetine Nausea Only    Rapid heartbeat   Ramipril  Other (See Comments)    TONGUE ULCERS    Rosuvastatin Other (See Comments)    SEVERE MYALGIA   Pradaxa  [Dabigatran  Etexilate Mesylate]     Throat irritation   Carbamazepine Rash   Codeine  Itching   Phenytoin Sodium Extended Rash   Simvastatin  Other (See Comments)    Increase in CK, myalgias   Wellbutrin  [Bupropion ] Palpitations    Immunization History  Administered Date(s) Administered   Fluad Quad(high Dose 65+) 04/17/2019, 04/30/2020   Fluad Trivalent(High Dose 65+) 05/23/2023   H1N1 07/24/2008   INFLUENZA, HIGH DOSE SEASONAL PF 05/30/2017, 05/21/2018   Influenza  Split 07/17/2011, 09/27/2011, 04/29/2012   Influenza Whole 07/20/2004, 07/04/2007, 05/07/2008, 05/19/2009, 05/17/2010   Influenza,inj,Quad PF,6+ Mos 05/20/2013, 05/06/2014, 05/17/2015, 05/15/2016, 04/27/2021   Influenza-Unspecified 05/07/2018   PFIZER(Purple Top)SARS-COV-2 Vaccination 09/25/2019, 10/16/2019, 06/04/2020, 01/04/2021, 08/06/2021   PNEUMOCOCCAL CONJUGATE-20 08/06/2021   Pfizer Covid-19 Vaccine Bivalent Booster 27yrs & up 05/07/2022   Pneumococcal Conjugate-13 11/22/2015, 08/06/2021   Pneumococcal Polysaccharide-23 05/20/2002, 05/07/2008, 06/25/2014   Respiratory Syncytial Virus Vaccine ,Recomb Aduvanted(Arexvy ) 06/26/2022   Td 09/07/2009   Zoster, Live 09/21/2011    Family History  Problem Relation Age of Onset   Diabetes Mother    Alzheimer's disease Mother    Hypertension Mother    Obesity Mother    Arthritis Mother    Heart disease Father    Seizures Sister    Allergies Sister    Colon polyps Sister    Heart disease Sister    Early death Sister    Anxiety disorder Sister    Arthritis Sister    Depression Sister    Hypertension Sister    Other Brother        Thyroid  problem 10/2016   Hearing loss Brother    Vision loss Brother    Heart disease Brother    Prostate cancer Brother 38       same brother as throat cancer   Throat cancer Brother        dx in his 58s; also a smoker   Arthritis Brother    Hypertension Brother    Breast cancer Maternal  Aunt 55   Colon cancer Maternal Aunt 61       same sister as breast at 21   Breast cancer Maternal Aunt        dx in her 61s   Lung cancer Maternal Uncle    Cancer Maternal Grandmother 13       colon cancer or abdominal cancer   Lung cancer Maternal Grandfather 41   Heart disease Son        Cardiac Arrest 07/2016   Pancreatic cancer Cousin 52       maternal first cousin   Breast cancer Cousin        paternal first cousin twice removed died in her 30s   Stroke Neg Hx    Esophageal cancer Neg Hx    Stomach  cancer Neg Hx      Current Outpatient Medications:    albuterol  (VENTOLIN  HFA) 108 (90 Base) MCG/ACT inhaler, INHALE 2 PUFFS INTO THE LUNGS EVERY 6 (SIX) HOURS AS NEEDED FOR WHEEZING OR SHORTNESS OF BREATH., Disp: 3 each, Rfl: 5   azelastine  (ASTELIN ) 0.1 % nasal spray, Place 1 spray into both nostrils 2 (two) times daily. Use in each nostril as directed, Disp: 90 mL, Rfl: 3   benzonatate  (TESSALON ) 200 MG capsule, Take 1 capsule (200 mg total) by mouth 3 (three) times daily as needed for cough., Disp: 90 capsule, Rfl: 2   budesonide -formoterol  (SYMBICORT ) 80-4.5 MCG/ACT inhaler, Inhale 1 puff into the lungs 2 (two) times daily., Disp: 3 each, Rfl: 4   Cholecalciferol  (DIALYVITE VITAMIN D  5000) 125 MCG (5000 UT) capsule, Take 5,000 Units by mouth daily., Disp: , Rfl:    cloBAZam  (ONFI ) 10 MG tablet, Take 1 tablet by mouth twice daily, Disp: 180 tablet, Rfl: 1   diltiazem  (CARDIZEM  CD) 120 MG 24 hr capsule, TAKE 1 CAPSULE EVERY DAY, Disp: 90 capsule, Rfl: 2   ELIQUIS  5 MG TABS tablet, TAKE 1 TABLET TWICE DAILY, Disp: 180 tablet, Rfl: 3   empagliflozin  (JARDIANCE ) 10 MG TABS tablet, TAKE 1 TABLET EVERY DAY, Disp: 90 tablet, Rfl: 3   famotidine  (PEPCID ) 40 MG tablet, TAKE 1 TABLET AT BEDTIME (NEED MD APPOINTMENT), Disp: 90 tablet, Rfl: 3   fluticasone  (FLONASE ) 50 MCG/ACT nasal spray, Place 2 sprays into both nostrils daily as needed for allergies or rhinitis., Disp: 48 g, Rfl: 3   FOLIC ACID  PO, Take 1 tablet by mouth daily., Disp: , Rfl:    furosemide  (LASIX ) 20 MG tablet, TAKE 2 TABLETS (40 MG) 3 DAYS PER WEEK AND TAKE 1 TABLET (20 MG) THE OTHER DAYS OF THE WEEK, Disp: 130 tablet, Rfl: 2   hydroquinone 4 % cream, Apply 1 Application topically at bedtime., Disp: , Rfl:    hydroxypropyl methylcellulose / hypromellose (ISOPTO TEARS / GONIOVISC) 2.5 % ophthalmic solution, Place 1 drop into both eyes at bedtime., Disp: , Rfl:    latanoprost  (XALATAN ) 0.005 % ophthalmic solution, Place 1 drop into both  eyes at bedtime., Disp: , Rfl:    levocetirizine (XYZAL ) 5 MG tablet, Take 5 mg by mouth at bedtime., Disp: , Rfl:    levothyroxine  (SYNTHROID ) 50 MCG tablet, TAKE 1 TABLET EVERY DAY, Disp: 90 tablet, Rfl: 3   magnesium  oxide (MAG-OX) 400 (240 Mg) MG tablet, TAKE 1 TABLET TWICE DAILY, Disp: 180 tablet, Rfl: 3   montelukast  (SINGULAIR ) 10 MG tablet, TAKE 1 TABLET AT BEDTIME, Disp: 90 tablet, Rfl: 3   Multiple Vitamins-Minerals (MULTIVITAMIN PO), Take 1 tablet by mouth daily., Disp: ,  Rfl:    oxyCODONE  (OXY IR/ROXICODONE ) 5 MG immediate release tablet, Take 0.5-1 tablets (2.5-5 mg total) by mouth 2 (two) times daily as needed for severe pain (pain score 7-10)., Disp: 45 tablet, Rfl: 0   pantoprazole  (PROTONIX ) 40 MG tablet, TAKE 1 TABLET EVERY DAY, Disp: 90 tablet, Rfl: 0   Pirfenidone  267 MG TABS, Take 3 tablets (801 mg total) by mouth 3 (three) times daily with meals., Disp: 270 tablet, Rfl: 4   polyethylene glycol powder (GLYCOLAX /MIRALAX ) powder, Take 17 grams by mouth daily, Disp: 1080 g, Rfl: 0   potassium chloride  (KLOR-CON  M) 10 MEQ tablet, TAKE 1/2 TABLET EVERY DAY, Disp: 45 tablet, Rfl: 3   REPATHA  SURECLICK 140 MG/ML SOAJ, INJECT 140 MG INTO THE SKIN EVERY 14 DAYS, Disp: 6 mL, Rfl: 0   Respiratory Therapy Supplies (FLUTTER) DEVI, 1 Device by Does not apply route as needed., Disp: 1 each, Rfl: 0   sodium chloride  HYPERTONIC 3 % nebulizer solution, Take by nebulization 2 (two) times daily as needed for cough. 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, Disp: 750 mL, Rfl: 12   tiZANidine  (ZANAFLEX ) 4 MG tablet, TAKE 1/2 TO 1 TABLET (2 - 4 MG) TWICE A DAY, Disp: 180 tablet, Rfl: 3   traZODone  (DESYREL ) 100 MG tablet, TAKE 1 TO 1 AND 1/2 TABLETS AT BEDTIME, Disp: 135 tablet, Rfl: 3   triamcinolone  cream (KENALOG ) 0.1 %, Apply topically 2 (two) times daily., Disp: , Rfl:    fexofenadine (ALLEGRA) 180 MG tablet, Take 180 mg by mouth daily as needed for allergies (alternates with zyrtec sometimes)., Disp: , Rfl:     pregabalin  (LYRICA ) 25 MG capsule, Take 1 capsule (25 mg total) by mouth 3 (three) times daily. Start with 1 pill/day and work up s discussed- for neurogenic cough- stopping gabapentin , Disp: 90 capsule, Rfl: 5  Current Facility-Administered Medications:    lidocaine  (XYLOCAINE ) 1 % (with pres) injection 1 mL, 1 mL, Other, Once, Lovorn, Megan, MD   lidocaine  (XYLOCAINE ) 1 % (with pres) injection 3 mL, 3 mL, Other, Once, Lovorn, Megan, MD      Objective:   Vitals:   04/23/24 1559  BP: 118/60  Pulse: 86  SpO2: 97%  Weight: 205 lb (93 kg)  Height: 5' 6.5 (1.689 m)    Estimated body mass index is 32.59 kg/m as calculated from the following:   Height as of this encounter: 5' 6.5 (1.689 m).   Weight as of this encounter: 205 lb (93 kg).  @WEIGHTCHANGE @  [employer]   04/23/24 1559  Weight: 205 lb (93 kg)     Physical Exam   General: No distress. Looks well O2 at rest: no Cane present: no Sitting in wheel chair: no Frail: no Obese: no Neuro: Alert and Oriented x 3. GCS 15. Speech normal Psych: Pleasant Resp:  Barrel Chest - no.  Wheeze - no, Crackles - ys bsae, No overt respiratory distress CVS: Normal heart sounds. Murmurs - no Ext: Stigmata of Connective Tissue Disease - no HEENT: Normal upper airway. PEERL +. No post nasal drip        Assessment/     Assessment & Plan IPF (idiopathic pulmonary fibrosis) (HCC)  Encounter for therapeutic drug monitoring  History of pulmonary embolism  This real concern for both worsening pulmonary fibrosis and onset of pulmonary hypertension.  Pulmonary hypertension needs to be ruled out.  We have 2 signal such as echocardiogram and enlarged pulmonary artery on the CT suggesting pulmonary hypertension although the BNP is normal.  She needs a right heart cath.  If she has a WHO group 3 pulmonary hypertension then she will qualify for inhaled treprostinil which just yesterday there was news to release the European respiratory  Society showing beneficial effects as primary treatment for IPF.  I explained this to her and therefore she is interested in right heart cath.  She wanted know if right heart cath was LVAD [family members going to go through this procedure].  I explained to her about the right heart catheter risk benefits and limitations.  She is interested in undergoing a right heart cath.  We then discussed the fact the right heart cath can be offered through a research study versus standard of care.  I gave her the consent for the research study called [facility] (Dr. Annella the principal investigator] she is going to review this.  Will also prescreen her to see if she qualifies for the research study.  If she does not have pulmonary hypertension then we have to do add-on therapy for the IPF which could be in the form of clinical trial or early next year if and when Nerandomilast is approved.  PLAN Patient Instructions  IPF (idiopathic pulmonary fibrosis) (HCC) Medication monitoring encounter History of abnormal liver function History of chronic systolic dysfunction   - -Currently tolerating Esbriet  well without any side effects although liver function test was elevated in January 2025 IPF getting worse on CT but based on CT chest and echo summer 2025 and PFT summer 2025, Pulmonary Hypertension needs rule out     Plan -Check liver function test today and INR 04/23/2024 - continue esbreit - you need Right Heart Cath  -take consent for for [facility] STUDY; will  do it via research if you meet criteria + you are intersted  - if not, then we will do via standard of care  History of 2019 novel coronavirus disease (COVID-19) History of pulmonary embolism April 02, 2023 Throat burning with Pradaxa   - Doing well on Eliquis    Plan -Continue Eliquis  full doe - Check D-dimer for monitoring purposes - List PRADAXA  as allergy (throat burning)  Chronic cough Irritable larynx History of dust mite  allergy History of asthma   - 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 - Noted you stopped gabapentin  bcuase it made you tired  Plan ---Continue allergy/asthma treatment of Symbicort  and Singulair , Flonase  and Allegra as before - Continue house dust mite control as you have been doing - continue teassoln perles    OSA (obstructive sleep apnea)  - Plan - Under the care of  sleep doctor Dr. MALVA   Followup  -- 2 months to  to see Dr. Geronimo in a 30-minute visit  -Symptom score and exercise hypoxemia test at follow-up       FOLLOWUP    Return in about 8 weeks (around 06/18/2024) for 30 min visit, with Dr Geronimo, Face to Face Visit.    SIGNATURE    Dr. Dorethia Geronimo, M.D., F.C.C.P,  Pulmonary and Critical Care Medicine Staff Physician, [facility] Director - Interstitial Lung Disease  Program  Pulmonary Fibrosis [facility] at [facility] [place], [place], [address]  Pager: [phone], If no answer or between  15:00h - 7:00h: call [phone] Telephone: [phone]  4:46 PM 04/23/2024

## 2024-04-23 ENCOUNTER — Encounter: Payer: Self-pay | Admitting: Internal Medicine

## 2024-04-23 ENCOUNTER — Ambulatory Visit (INDEPENDENT_AMBULATORY_CARE_PROVIDER_SITE_OTHER): Admitting: Internal Medicine

## 2024-04-23 VITALS — BP 118/60 | HR 86 | Ht 66.5 in | Wt 205.0 lb

## 2024-04-23 DIAGNOSIS — Z5181 Encounter for therapeutic drug level monitoring: Secondary | ICD-10-CM

## 2024-04-23 DIAGNOSIS — J84112 Idiopathic pulmonary fibrosis: Secondary | ICD-10-CM | POA: Diagnosis not present

## 2024-04-23 DIAGNOSIS — Z86711 Personal history of pulmonary embolism: Secondary | ICD-10-CM

## 2024-04-23 NOTE — Patient Instructions (Addendum)
 IPF (idiopathic pulmonary fibrosis) (HCC) Medication monitoring encounter History of abnormal liver function History of chronic systolic dysfunction   - -Currently tolerating Esbriet  well without any side effects although liver function test was elevated in January 2025 IPF getting worse on CT but based on CT chest and echo summer 2025 and PFT summer 2025, Pulmonary Hypertension needs rule out     Plan -Check liver function test today and INR 04/23/2024 - continue esbreit - you need Right Heart Cath  -take consent for for [facility] STUDY; will  do it via research if you meet criteria + you are intersted  - if not, then we will do via standard of care  History of 2019 novel coronavirus disease (COVID-19) History of pulmonary embolism April 02, 2023 Throat burning with Pradaxa   - Doing well on Eliquis    Plan -Continue Eliquis  full doe - Check D-dimer for monitoring purposes - List PRADAXA  as allergy (throat burning)  Chronic cough 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 History of asthma   - 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 - Noted you stopped gabapentin  bcuase it made you tired  Plan ---Continue allergy/asthma treatment of Symbicort  and Singulair , Flonase  and Allegra as before - Continue house dust mite control as you have been doing - continue teassoln perles    OSA (obstructive sleep apnea)  - Plan - Under the care of  sleep doctor Dr. MALVA   Followup  -- 2 months to  to see Dr. Geronimo in a 30-minute visit  -Symptom score and exercise hypoxemia test at follow-up

## 2024-04-24 LAB — HEPATIC FUNCTION PANEL
ALT: 46 U/L — ABNORMAL HIGH (ref 0–35)
AST: 55 U/L — ABNORMAL HIGH (ref 0–37)
Albumin: 4.4 g/dL (ref 3.5–5.2)
Alkaline Phosphatase: 144 U/L — ABNORMAL HIGH (ref 39–117)
Bilirubin, Direct: 0.1 mg/dL (ref 0.0–0.3)
Total Bilirubin: 0.3 mg/dL (ref 0.2–1.2)
Total Protein: 8 g/dL (ref 6.0–8.3)

## 2024-04-24 LAB — PROTIME-INR
INR: 1.4 ratio — ABNORMAL HIGH (ref 0.8–1.0)
Prothrombin Time: 14.2 s — ABNORMAL HIGH (ref 9.6–13.1)

## 2024-04-25 ENCOUNTER — Other Ambulatory Visit: Payer: Self-pay | Admitting: Internal Medicine

## 2024-04-25 ENCOUNTER — Ambulatory Visit: Payer: Self-pay | Admitting: Internal Medicine

## 2024-04-25 ENCOUNTER — Other Ambulatory Visit: Payer: Self-pay | Admitting: Interventional Cardiology

## 2024-04-25 NOTE — Progress Notes (Signed)
 LFTs chronically high but not super high. No c hange in plan with esbriet . But continue checks every 2-3 months. Avoid exces tylenol  and any other medicine that can hurty liver

## 2024-04-27 ENCOUNTER — Other Ambulatory Visit (HOSPITAL_COMMUNITY): Payer: Self-pay

## 2024-04-27 ENCOUNTER — Other Ambulatory Visit: Payer: Self-pay | Admitting: Internal Medicine

## 2024-04-27 ENCOUNTER — Ambulatory Visit: Attending: Internal Medicine | Admitting: Internal Medicine

## 2024-04-27 ENCOUNTER — Encounter: Payer: Self-pay | Admitting: Internal Medicine

## 2024-04-27 ENCOUNTER — Other Ambulatory Visit: Payer: Self-pay

## 2024-04-27 VITALS — BP 122/74 | HR 73 | Temp 98.0°F | Ht 66.5 in | Wt 208.0 lb

## 2024-04-27 DIAGNOSIS — E785 Hyperlipidemia, unspecified: Secondary | ICD-10-CM

## 2024-04-27 DIAGNOSIS — T466X5A Adverse effect of antihyperlipidemic and antiarteriosclerotic drugs, initial encounter: Secondary | ICD-10-CM | POA: Insufficient documentation

## 2024-04-27 DIAGNOSIS — I472 Ventricular tachycardia, unspecified: Secondary | ICD-10-CM | POA: Diagnosis present

## 2024-04-27 DIAGNOSIS — I10 Essential (primary) hypertension: Secondary | ICD-10-CM

## 2024-04-27 DIAGNOSIS — R748 Abnormal levels of other serum enzymes: Secondary | ICD-10-CM

## 2024-04-27 DIAGNOSIS — I700 Atherosclerosis of aorta: Secondary | ICD-10-CM | POA: Diagnosis not present

## 2024-04-27 DIAGNOSIS — I428 Other cardiomyopathies: Secondary | ICD-10-CM | POA: Diagnosis present

## 2024-04-27 DIAGNOSIS — M609 Myositis, unspecified: Secondary | ICD-10-CM

## 2024-04-27 DIAGNOSIS — T466X5D Adverse effect of antihyperlipidemic and antiarteriosclerotic drugs, subsequent encounter: Secondary | ICD-10-CM

## 2024-04-27 DIAGNOSIS — Z95810 Presence of automatic (implantable) cardiac defibrillator: Secondary | ICD-10-CM | POA: Diagnosis present

## 2024-04-27 DIAGNOSIS — J84112 Idiopathic pulmonary fibrosis: Secondary | ICD-10-CM

## 2024-04-27 MED ORDER — PIRFENIDONE 267 MG PO TABS
801.0000 mg | ORAL_TABLET | Freq: Three times a day (TID) | ORAL | 2 refills | Status: DC
  Filled 2024-04-27: qty 270, 30d supply, fill #0
  Filled 2024-05-25: qty 270, 30d supply, fill #1
  Filled 2024-06-18 – 2024-08-06 (×3): qty 270, 30d supply, fill #2

## 2024-04-27 NOTE — Telephone Encounter (Signed)
 Refill sent for PIRFENIDONE  to [facility]: [phone]   Dose: 801 mg three times daily  Last OV: 04/23/2024 Provider: Dr. Geronimo Pertinent labs: LFTs on 04/23/2024 - chronically elevated -  LFTs chronically high but not super high. No c hange in plan with esbriet . But continue checks every 2-3 months. Avoid exces tylenol  and any other medicine that can hurty liver   Next OV: 06/24/2024  [name], PharmD, BCPS Clinical Pharmacist  [facility]

## 2024-04-27 NOTE — Progress Notes (Signed)
 Specialty Pharmacy Refill Coordination Note  [name] is a 73 y.o. female contacted today regarding refills of specialty medication(s) Pirfenidone    Patient requested Delivery   Delivery date: 05/01/24   Verified address: [address]   Medication will be filled on 04/30/24, pending refill approval.

## 2024-04-27 NOTE — Progress Notes (Signed)
 Cardiology Office Note   Date:  04/27/2024  ID:  [name], DOB 16-Jan-1951, MRN [id number] PCP: Leonce Carola [name]  [facility] Providers Cardiologist:  None Electrophysiologist:  OLE [name], MD     History of Present Illness [name] is a 73 y.o. female is a 73 year old female with past medical history of hypertension, mitral valve prolapse, VT, cerebral aneurysm, PFO, nonischemic cardiomyopathy s/p ICD [facility] Scientific single-chamber ICD), pulmonary embolism, IPF, hypothyroidism, statin intolerance, ACE inhibitor allergy for follow-up to discuss elevated CK levels.  Patient is concerned that her CK levels are elevated since they started increasing around the time she started Repatha  in 2024. She also notes some muscle aches and pains, notably in her leg. She has also had fatigue. She has not had any chest pain.   ROS:  Review of Systems  All other systems reviewed and are negative.   Physical Exam  Physical Exam Vitals and nursing note reviewed.  Constitutional:      Appearance: Normal appearance.  HENT:     Head: Normocephalic and atraumatic.  Eyes:     Conjunctiva/sclera: Conjunctivae normal.  Cardiovascular:     Rate and Rhythm: Normal rate and regular rhythm.  Pulmonary:     Effort: Pulmonary effort is normal.     Breath sounds: Rales present.  Musculoskeletal:     Right lower leg: No edema.     Left lower leg: No edema.  Neurological:     Mental Status: She is alert. Mental status is at baseline.  Psychiatric:        Mood and Affect: Mood normal.        Behavior: Behavior normal.     VS:  BP 122/74   Pulse 73   Ht 5' 6.5 (1.689 m)   Wt 208 lb (94.3 kg)   LMP  (LMP Unknown)   SpO2 95%   BMI 33.07 kg/m         Wt Readings from Last 3 Encounters:  04/27/24 208 lb (94.3 kg)  04/23/24 205 lb (93 kg)  04/02/24 205 lb (93 kg)         Studies Reviewed  Echo 02/04/2024: Left ventricular ejection fraction, by  estimation, is 45 to 50%. The  left ventricle has mildly decreased function. The left ventricle  demonstrates global hypokinesis. There is mild concentric left ventricular  hypertrophy. Left ventricular diastolic  parameters are consistent with Grade I diastolic dysfunction (impaired relaxation).       Risk Assessment/Calculations              ASSESSMENT  Chronic HF improved EF, not in an exacerbation EF 35-40% in 2013, now 45-50% GDMT: *Has an ACEi allergy On Jardiance  Unclear why she is not on spironolactone or beta blockers Patient is concerned about fatigue worsening with beta blockers NICM s/p [employer] single-chamber ICD near ERI Hypertension Elevated CK unclear etiology. Began trending up around the time she started Repatha . It's unclear if this is the etiology or not at this time.  OSA Statin intolerance Hyperlipidemia on Repatha  Coronary artery calcification by CT PFO Chronic anticoagulation managed by pulmonology History of idiopathic pulmonary fibrosis crackles on exam. Follows with pulmonology History of pulmonary embolism in 2024 History of cerebral aneurysm s/p clip History of Mitral valve prolapse not noted on most recent echo History of VT s/p ICD  Plan  Will trial off Repatha  for now and recheck a CK level and lipid panel in 2 months. If her CK  levels come down while off the medication then it's possible that this is the cause of the elevated CK and we will refer to the lipid clinic at that time for further management. If her CK levels continue to trend up despite discontinuing the Repatha , then we will restart the Repatha  and recommend discussion with her rheumatologist LDL goal < 70 She reports recurrent vaginal yeast infections in the setting of Jardiance . She wishes to continue the Jardiance  at this time and discuss treatment options with her PCP.  Will hold off on adjusting her HF meds at this time to avoid too many changes and consider adjustment  next visit   She will need to follow up with EP to discuss a generator change.  Follow up: 3 months           Signed, Emeline FORBES Calender, MD

## 2024-04-27 NOTE — Patient Instructions (Addendum)
 Medication Instructions:  Your physician has recommended you make the following change in your medication:  Hold Repatha    Lab Work: CK level and lipid panel in 2 months.   You may go to any [facility] Location for your lab work:  [facility] - [address] ([facility]) - [address] [phone] [address]  [place] - [address]   [place]  - [address]   [place] - [address] - [address] [employer]  - [address] [place] - [address] ([facility]   If you have labs (blood work) drawn today and your tests are completely normal, you will receive your results only by: [employer] (if you have MyChart)  If you have any lab test that is abnormal or we need to change your treatment, we will call you or send a MyChart message to review the results.  Testing/Procedures: None ordered.  Follow-Up: At [facility], you and your health needs are our priority.  As part of our continuing mission to provide you with exceptional heart care, we have created designated Provider Care Teams.  These Care Teams include your primary Cardiologist (physician) and Advanced Practice Providers (APPs -  Physician Assistants and Nurse Practitioners) who all work together to provide you with the care you need, when you need it.   Your next appointment:   3 months  The format for your next appointment:   In Person  Provider:   Emeline Calender, DO

## 2024-04-30 ENCOUNTER — Other Ambulatory Visit: Payer: Self-pay

## 2024-05-04 ENCOUNTER — Encounter

## 2024-05-07 ENCOUNTER — Encounter: Admitting: Pulmonary Disease

## 2024-05-08 ENCOUNTER — Encounter: Payer: Self-pay | Admitting: Pulmonary Disease

## 2024-05-08 DIAGNOSIS — J84112 Idiopathic pulmonary fibrosis: Secondary | ICD-10-CM

## 2024-05-11 ENCOUNTER — Telehealth: Payer: Self-pay | Admitting: Internal Medicine

## 2024-05-11 ENCOUNTER — Encounter

## 2024-05-11 LAB — CUP PACEART REMOTE DEVICE CHECK
Battery Remaining Longevity: 3 mo — CL
Battery Remaining Percentage: 0 %
Brady Statistic RV Percent Paced: 0 %
Date Time Interrogation Session: 20250922030100
HighPow Impedance: 72 Ohm
Implantable Lead Connection Status: 753985
Implantable Lead Implant Date: 20050603
Implantable Lead Location: 753860
Implantable Lead Model: 185
Implantable Lead Serial Number: 116340
Implantable Pulse Generator Implant Date: 20110505
Lead Channel Impedance Value: 591 Ohm
Lead Channel Pacing Threshold Amplitude: 0.8 V
Lead Channel Pacing Threshold Pulse Width: 0.4 ms
Lead Channel Setting Pacing Amplitude: 2.4 V
Lead Channel Setting Pacing Pulse Width: 0.4 ms
Lead Channel Setting Sensing Sensitivity: 0.4 mV
Pulse Gen Serial Number: 266301
Zone Setting Status: 755011

## 2024-05-11 NOTE — Telephone Encounter (Signed)
 [place] (cc  Powell, Emer, Elliott - CRC, and Matt - PI)   [name] was consented on Friday May 08, 2024 for Phinder study and has time till May 22, 2024 Friday to get the RHC (14 days). She requires a   A) Echo   B) after echo -  RESEARCH Right Hearth Cath by you or Dan. Our new coordinator is Scientist, research (life sciences) (copied).   C) She  is available tue-thu as her best days. I am thinking next week Tue- Thu 10/30- 9/2 as the sweet spot from study window related . This can allows us  to get echo this week  Are you able to help/accommodate?  Thanks  MR  PS: hopefully no billing cycle issues this time

## 2024-05-12 ENCOUNTER — Other Ambulatory Visit: Payer: Self-pay

## 2024-05-12 DIAGNOSIS — I270 Primary pulmonary hypertension: Secondary | ICD-10-CM

## 2024-05-12 DIAGNOSIS — Z006 Encounter for examination for normal comparison and control in clinical research program: Secondary | ICD-10-CM

## 2024-05-13 NOTE — Telephone Encounter (Signed)
 Update   - Patient is I getting echo on 06/10/24 Wednesday (ok per protocol) and therefore needs RHC between 06/11/24  thu and on/before 06/19/24 Friday - last day is 06/19/24 Friday which is day 42 from consent signing  SHould not be done next week  Thanks    SIGNATURE    Dr. Dorethia Cave, M.D., F.C.C.P,  Pulmonary and Critical Care Medicine Staff Physician, [facility] Director - Interstitial Lung Disease  Program  Pulmonary Fibrosis [facility] at [facility] [place], [place], [address]   Pager: [phone], If no answer  -> Check AMION or Try [phone] Telephone (clinical office): [phone] Telephone (research): [phone]  10:04 AM 05/13/2024

## 2024-05-19 ENCOUNTER — Other Ambulatory Visit (HOSPITAL_COMMUNITY): Payer: Self-pay

## 2024-05-19 ENCOUNTER — Telehealth (HOSPITAL_COMMUNITY): Payer: Self-pay

## 2024-05-19 DIAGNOSIS — J84112 Idiopathic pulmonary fibrosis: Secondary | ICD-10-CM

## 2024-05-19 NOTE — Telephone Encounter (Signed)
 Patient called and have her scheduled for RHC on 06/18/24 at 7.30 in the morning. Patient verbalized understanding of instructions. Instructions also sent via my chart message to patient

## 2024-05-19 NOTE — Progress Notes (Signed)
 Office Visit Note  Patient: [name]             Date of Birth: 19-Sep-1950           MRN: [id number]             PCP: Leonce Carola PARAS, PA-C Referring: Leonce Carola PARAS, PA-C Visit Date: 06/02/2024 Occupation: Data Unavailable  Subjective:  Pain in joints  History of Present Illness: [name] is a 73 y.o. female with history of osteoarthritis, degenerative disc disease and idiopathic pulmonary fibrosis, fibromyalgia.  She returns today after her last visit in May 2025.  She continues to have some stiffness in her hands especially her CMC joints.  She has been gets a cortisone injection in her left shoulder joint by Dr. Cornelio intermittently.  She states that bilateral total knee replacements are doing well.  She describes some discomfort in her left hip.  She had surgery on her right foot by Dr. Kit is doing better.  She continues to have some discomfort in the neck region.  She is on pirfenidone  by Dr. Tonna for IPF.  She gets shortness of breath on exertion.  She continues to have some generalized discomfort from fibromyalgia for which she has been followed by Dr. Lovorn.    Activities of Daily Living:  Patient reports morning stiffness for 15-20 minutes.   Patient Reports nocturnal pain.  Difficulty dressing/grooming: Denies Difficulty climbing stairs: Reports Difficulty getting out of chair: Reports Difficulty using hands for taps, buttons, cutlery, and/or writing: Denies  Review of Systems  Constitutional:  Negative for fatigue.  HENT:  Positive for mouth dryness. Negative for mouth sores.   Eyes:  Negative for dryness.  Respiratory:  Positive for shortness of breath. Negative for wheezing.   Cardiovascular:  Positive for palpitations. Negative for chest pain.  Gastrointestinal:  Negative for blood in stool, constipation and diarrhea.  Endocrine: Negative for increased urination.  Genitourinary:  Negative for involuntary urination.  Musculoskeletal:   Positive for joint pain, joint pain and morning stiffness. Negative for gait problem, joint swelling, myalgias, muscle weakness, muscle tenderness and myalgias.  Skin:  Positive for color change and hair loss. Negative for rash and sensitivity to sunlight.  Allergic/Immunologic: Negative for susceptible to infections.  Neurological:  Positive for dizziness. Negative for headaches.  Hematological:  Negative for swollen glands.  Psychiatric/Behavioral:  Positive for sleep disturbance. Negative for depressed mood. The patient is not nervous/anxious.     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   Localized swelling of right lower leg 08/29/2021   Tonsillar exudate 08/29/2021   Candida vaginitis 08/28/2021   Chronic pain syndrome 08/25/2021   Multifocal pneumonia 06/03/2021   Pre-operative respiratory examination 05/18/2021   Statin myopathy 04/19/2021   Pain in joint of right shoulder 02/10/2021   Arthritis of both acromioclavicular joints 01/02/2021   Trochanteric bursitis of right hip 10/03/2020   Tailbone injury 05/05/2020   UTI (urinary tract infection)  05/05/2020   Insomnia 05/05/2020   Leg cramps 02/16/2020   Dysuria 02/16/2020   Right hip impingement syndrome 02/01/2020   Right bicipital tenosynovitis 02/01/2020   Bronchiectasis without complication (HCC) 01/22/2020   Myofascial pain 12/21/2019   Estrogen  deficiency 12/10/2019   Obesity (BMI 30-39.9) 12/10/2019   Bronchiectasis with (acute) exacerbation (HCC) 10/23/2019   Elevated LFTs 10/15/2019   Lower abdominal pain 09/01/2019   Neck pain 10/25/2017   Paresthesias in left hand 10/25/2017   Paresthesia of foot, bilateral 04/12/2017   Primary osteoarthritis of both hands 09/27/2016   Primary osteoarthritis of both knees 09/27/2016   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   Nasopharyngitis, 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/2016   S/P total knee replacement 02/13/2016   Chronic pain of both knees 12/06/2015   Globus pharyngeus 05/17/2015   Genetic testing 12/23/2014   Family history of breast cancer    Family history of colon cancer    Family history of pancreatic cancer    History of breast cancer 11/23/2014   Allergy to ACE inhibitors 09/27/2014   Prediabetes 06/23/2013   Muscle spasms of both lower extremities 01/15/2013   Hx of Clostridium difficile infection 10/10/2012   Dyspnea on exertion 04/02/2012   NICM (nonischemic cardiomyopathy) (HCC) 03/07/2012   Post-menopausal 01/11/2012   Atypical chest pain 11/07/2011   Obstructive sleep apnea 04/04/2010   V-tach (HCC) 01/03/2009   ICD  [employer]  Single chamber 01/03/2009   PFO (patent foramen ovale) 09/29/2008   Vitamin D  deficiency 08/17/2008   Hypothyroidism 11/18/2006   Hyperlipidemia 11/18/2006   Depression with anxiety 11/18/2006   Essential hypertension 11/18/2006   Mitral valve prolapse 11/18/2006   Cerebral aneurysm 11/18/2006   Allergic rhinitis 11/18/2006   GERD 11/18/2006   Diverticulosis of colon 11/18/2006   Fatty liver 11/18/2006   Fibromyalgia 11/18/2006    Past Medical History:  Diagnosis Date   AICD (automatic cardioverter/defibrillator) present    [employer]   Allergy 09/20/1994   Anxiety    Arthritis    Back pain    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   Bronchiectasis (HCC)    Cerebral aneurysm, nonruptured    had a  clip put in   CHF (congestive heart failure) (HCC)    Clostridium difficile infection    Depressive disorder, not elsewhere classified    Diverticulosis of colon (without mention of hemorrhage)    Esophageal reflux    Fatty liver    Fibromyalgia    Gastritis    GERD (gastroesophageal reflux disease)    GI bleed 2004   Glaucoma    Hiatal hernia    History of COVID-19 05/04/2021   Hyperlipidemia    Hypertension    Hypothyroidism    Internal hemorrhoids    Joint pain    Multifocal pneumonia 06/03/2021   Obstructive sleep apnea (adult) (pediatric)    Osteoarthritis    Ostium secundum type atrial septal defect    Other chronic nonalcoholic liver disease    Other pulmonary embolism and infarction    Palpitations    Paroxysmal ventricular tachycardia (HCC)    Personal history of radiation therapy    PONV (postoperative nausea and vomiting)    Presence of permanent cardiac pacemaker    PUD (peptic ulcer disease)    Radiation 02/03/15-03/10/15   Right Breast   Sarcoid    per pt , not sure   Schatzki's ring    Shortness of breath    with exertion -  has oxygen prn via [place] at 2 L   Sleep apnea    uses CPAP nightly   Stroke [facility]) 2013   tia/ pt feels it was around 2008 0r 2009   Takotsubo syndrome    Tubular adenoma of colon    Unspecified transient cerebral ischemia    Unspecified vitamin D  deficiency     Family History  Problem Relation Age of Onset   Diabetes Mother    Alzheimer's disease Mother    Hypertension Mother    Obesity Mother    Arthritis Mother    Heart disease Father    Seizures Sister    Allergies Sister    Colon polyps Sister    Heart disease Sister    Early death Sister    Anxiety disorder Sister    Arthritis Sister    Depression Sister    Hypertension Sister    Other Brother        Thyroid  problem 10/2016   Hearing loss Brother    Vision loss Brother    Heart disease Brother    Prostate cancer Brother 14       same brother as throat cancer    Throat cancer Brother        dx in his 40s; also a smoker   Arthritis Brother    Hypertension Brother    Breast cancer Maternal Aunt 55   Colon cancer Maternal Aunt 69       same sister as breast at 25   Breast cancer Maternal Aunt        dx in her 89s   Lung cancer Maternal Uncle    Cancer Maternal Grandmother 73       colon cancer or abdominal cancer   Lung cancer Maternal Grandfather 19   Heart disease Son        Cardiac Arrest 07/2016   Pancreatic cancer Cousin 39       maternal first cousin   Breast cancer Cousin        paternal first cousin twice removed died in her 30s   Congestive Heart Failure Nephew    Stroke Neg Hx    Esophageal cancer Neg Hx    Stomach cancer Neg Hx    Past Surgical History:  Procedure Laterality Date   ABDOMINAL HYSTERECTOMY  11/22/1998   ABI  2006   normal   BONE EXOSTOSIS EXCISION Right 04/02/2024   Procedure: EXCISION, EXOSTOSIS;  Surgeon: Kit Rush, MD;  Location: [facility] OR;  Service: Orthopedics;  Laterality: Right;   BREAST EXCISIONAL BIOPSY     BREAST LUMPECTOMY Right 2016   BREAST LUMPECTOMY WITH RADIOACTIVE SEED LOCALIZATION Right 01/03/2015   Procedure: BREAST LUMPECTOMY WITH RADIOACTIVE SEED LOCALIZATION;  Surgeon: Deward Null III, MD;  Location: [facility] OR;  Service: General;  Laterality: Right;   CARDIAC DEFIBRILLATOR PLACEMENT  2006; 2012   BSX single chamber ICD   CARDIAC DEFIBRILLATOR PLACEMENT  2025   carotid dopplers  2006   neg   CEREBRAL ANEURYSM REPAIR  02/1999   COLONOSCOPY     EYE SURGERY Right 02/2023   surgery center - for cataracts   HAMMER TOE SURGERY  11/15/2020   3rd digit bilateral feet    hospitalization  2004   GI bleed, PUD, diverticulosis (EGD,colonscopy)   hospitalization     PE, NSVT, s/p defib   JOINT REPLACEMENT  05/2021   KNEE ARTHROSCOPY     bilateral   LEFT HEART CATHETERIZATION WITH CORONARY ANGIOGRAM N/A 02/22/2012  Procedure: LEFT HEART CATHETERIZATION WITH CORONARY ANGIOGRAM;  Surgeon: Lonni [name] Cash, MD;  Location: [facility] CATH LAB;  Service: Cardiovascular;  Laterality: N/A;   PARTIAL HYSTERECTOMY     Fibroids   TONGUE BIOPSY  12/12/2017   due to sore tongue and white patches/abnormal cells   TOTAL KNEE ARTHROPLASTY Left 02/13/2016   Procedure: TOTAL KNEE ARTHROPLASTY;  Surgeon: Marcey Raman, MD;  Location: [facility] OR;  Service: Orthopedics;  Laterality: Left;   TOTAL KNEE ARTHROPLASTY Right 05/22/2021   Procedure: TOTAL KNEE ARTHROPLASTY;  Surgeon: Raman Marcey, MD;  Location: [facility] ORS;  Service: Orthopedics;  Laterality: Right;  with block   TUBAL LIGATION  09/20/1978   UPPER GASTROINTESTINAL ENDOSCOPY     Social History   Tobacco Use   Smoking status: Former    Current packs/day: 0.00    Average packs/day: 1 pack/day for 20.0 years (20.0 ttl pk-yrs)    Types: Cigarettes    Start date: 08/20/1969    Quit date: 08/20/1989    Years since quitting: 34.8    Passive exposure: Past   Smokeless tobacco: Never  Vaping Use   Vaping status: Never Used  Substance Use Topics   Alcohol  use: Yes    Alcohol /week: 1.0 standard drink of alcohol     Types: 1 Glasses of wine per week    Comment: Rare - occasionally   Drug use: Not Currently   Social History   Social History Narrative   Lives alone   Caffeine ldz:[id number]   Retired from [employer]   2 children -- 5 grandbabies - 1 great-grandbaby      Immunization History  Administered Date(s) Administered   Fluad Quad(high Dose 65+) 04/17/2019, 04/30/2020   Fluad Trivalent(High Dose 65+) 05/23/2023   H1N1 07/24/2008   INFLUENZA, HIGH DOSE SEASONAL PF 05/30/2017, 05/21/2018   Influenza Split 07/17/2011, 09/27/2011, 04/29/2012   Influenza Whole 07/20/2004, 07/04/2007, 05/07/2008, 05/19/2009, 05/17/2010   Influenza,inj,Quad PF,6+ Mos 05/20/2013, 05/06/2014, 05/17/2015, 05/15/2016, 04/27/2021   Influenza-Unspecified 05/07/2018   PFIZER(Purple Top)SARS-COV-2 Vaccination 09/25/2019, 10/16/2019, 06/04/2020, 01/04/2021, 08/06/2021    PNEUMOCOCCAL CONJUGATE-20 08/06/2021   Pfizer Covid-19 Vaccine Bivalent Booster 54yrs & up 05/07/2022   Pneumococcal Conjugate-13 11/22/2015, 08/06/2021   Pneumococcal Polysaccharide-23 05/20/2002, 05/07/2008, 06/25/2014   Respiratory Syncytial Virus Vaccine ,Recomb Aduvanted(Arexvy ) 06/26/2022   Td 09/07/2009   Zoster, Live 09/21/2011     Objective: Vital Signs: BP 111/77   Pulse 91   Temp 97.7 F (36.5 C)   Resp 13   Ht 5' 6.5 (1.689 m)   Wt 211 lb (95.7 kg)   LMP  (LMP Unknown)   BMI 33.55 kg/m    Physical Exam Vitals and nursing note reviewed.  Constitutional:      Appearance: She is well-developed.  HENT:     Head: Normocephalic and atraumatic.  Eyes:     Conjunctiva/sclera: Conjunctivae normal.  Cardiovascular:     Rate and Rhythm: Normal rate and regular rhythm.     Heart sounds: Normal heart sounds.  Pulmonary:     Effort: Pulmonary effort is normal.     Breath sounds: Normal breath sounds.     Comments: Crackles were audible in bilateral lung bases. Abdominal:     General: Bowel sounds are normal.     Palpations: Abdomen is soft.  Musculoskeletal:     Cervical back: Normal range of motion.  Lymphadenopathy:     Cervical: No cervical adenopathy.  Skin:    General: Skin is warm and dry.     Capillary Refill: Capillary  refill takes less than 2 seconds.  Neurological:     Mental Status: She is alert and oriented to person, place, and time.  Psychiatric:        Behavior: Behavior normal.      Musculoskeletal Exam: Patient had limited lateral rotation of the cervical spine.  She had discomfort with range of motion of the lumbar spine.  Shoulders, elbows, wrist joints were in good range of motion.  She had bilateral CMC palmar PIP and DIP thickening.  She had mild tenderness over the CMC joints.  Hip joints were in good range of motion.  She had discomfort with range of motion of her left hip joint.  Both knee joints were replaced.  She had no tenderness over  her ankles or MTPs.  CDAI Exam: CDAI Score: -- Patient Global: --; Provider Global: -- Swollen: --; Tender: -- Joint Exam 06/02/2024   No joint exam has been documented for this visit   There is currently no information documented on the homunculus. Go to the Rheumatology activity and complete the homunculus joint exam.  Investigation: No additional findings.  Imaging: CUP PACEART REMOTE DEVICE CHECK Result Date: 05/11/2024 ICD Monthly battery check.  Estimated <3 months remaining longevity. Normal device function. No new alerts. 1 VHR, 4 beats. Follow up as scheduled monthly - CS, CVRS   Recent Labs: Lab Results  Component Value Date   WBC 4.2 04/02/2024   HGB 13.9 04/02/2024   PLT 174 04/02/2024   NA 139 09/07/2023   K 4.0 09/07/2023   CL 108 09/07/2023   CO2 24 09/07/2023   GLUCOSE 113 (H) 09/07/2023   BUN 14 09/07/2023   CREATININE 0.81 09/07/2023   BILITOT 0.3 04/23/2024   ALKPHOS 144 (H) 04/23/2024   AST 55 (H) 04/23/2024   ALT 46 (H) 04/23/2024   PROT 8.0 04/23/2024   ALBUMIN 4.4 04/23/2024   CALCIUM 9.6 09/07/2023   GFRAA 82 10/01/2019   QFTBGOLDPLUS NEGATIVE 08/24/2022    Speciality Comments: Narc Agreement: 01/16/18  Procedures:  No procedures performed Allergies: Ace inhibitors, Amitriptyline hcl, Atorvastatin, Dilantin [phenytoin sodium extended], Duloxetine  hcl, Paroxetine, Ramipril , Rosuvastatin, Pradaxa  [dabigatran  etexilate mesylate], Carbamazepine, Codeine , Phenytoin sodium extended, Simvastatin , and Wellbutrin  [bupropion ]   Assessment / Plan:     Visit Diagnoses: Primary osteoarthritis of both hands-she continues to have some discomfort in her hands.  She had bilateral CMC prominence.  No PIP and DIP thickening was noted.  Chronic left shoulder pain-she continues to have some discomfort in her left shoulder.  She has been followed by pain management.  Hx of total knee replacement, bilateral - Right total knee replacement May 22, 2021 by Dr.  Dorian.  Being well.  Pain in left hip-patient has some discomfort in the left hip region with range of motion.  I offered x-rays to evaluate.  Patient would like to hold off.  She may discuss it further with the orthopedic surgeon.  I gave her handout on hip exercises.  Trochanteric bursitis, left hip-she gets intermittent discomfort.  Pain in right foot -patient states she had surgery by Dr. Kit and the pain has resolved.  DDD (degenerative disc disease), cervical-she good range of motion without discomfort.  IPF (idiopathic pulmonary fibrosis) (HCC) - diagnosed with IPF by Dr. Geronimo.  She has been on pirfenidone .  Fibromyalgia -she continues to have some generalized pain and discomfort.  She has been followed by Dr. Lovorn.  Trapezius muscle spasm-she gets injections at pain management.  Other medical problems listed as  follows:  History of hypertension  History of hypercholesterolemia  History of TIA (transient ischemic attack)  Brain aneurysm  History of breast cancer  Hx of Clostridium difficile infection  History of depression  History of diverticulosis  History of hypothyroidism  RLS (restless legs syndrome)  History of migraine  History of sleep apnea  Orders: No orders of the defined types were placed in this encounter.  No orders of the defined types were placed in this encounter.    Follow-Up Instructions: Return in about 6 months (around 12/01/2024) for Osteoarthritis.   Maya Nash, MD  Note - This record has been created using Animal nutritionist.  Chart creation errors have been sought, but may not always  have been located. Such creation errors do not reflect on  the standard of medical care.

## 2024-05-21 ENCOUNTER — Other Ambulatory Visit: Payer: Self-pay

## 2024-05-25 ENCOUNTER — Other Ambulatory Visit: Payer: Self-pay

## 2024-05-25 ENCOUNTER — Telehealth: Payer: Self-pay | Admitting: Cardiology

## 2024-05-25 NOTE — Telephone Encounter (Addendum)
 Patient is out of the state in [place] for a funeral all week. Does not have her remote monitor.  A few days ago the device started beeping at various intervals of the day since.  She is feeling well, under stress but doing well, does not recall feeling any therapy.  She is not dependent.   Battery was <3 months at last remote check on 9/22. Suspect that her battery has now triggered ERI for replacement.  Reassured her she has 3 months still on her device. She will send a manual transmission when she returns home on Monday next week for us  to determine for sure reason behind alert.   If she becomes symptomatic or feels like she has received therapy from device, she should go to the ER there so they can connect with device and determine what's occurring.   Patient verbalizes understanding.

## 2024-05-25 NOTE — Progress Notes (Signed)
 Specialty Pharmacy Refill Coordination Note  [name] is a 73 y.o. female contacted today regarding refills of specialty medication(s) Pirfenidone    Patient requested Delivery   Delivery date: 05/29/24   Verified address: [address] [place],  [place] [address]   Medication will be filled on 05/28/24.

## 2024-05-25 NOTE — Telephone Encounter (Signed)
  1. Has your device fired? no  2. Is you device beeping? yes  3. Are you experiencing draining or swelling at device site? no  4. Are you calling to see if we received your device transmission? No   5. Have you passed out? no   Please route to Device Clinic Pool

## 2024-05-27 ENCOUNTER — Telehealth: Payer: Self-pay | Admitting: Cardiology

## 2024-05-27 ENCOUNTER — Other Ambulatory Visit: Payer: Self-pay

## 2024-05-27 NOTE — Telephone Encounter (Signed)
 Pt contacted office on 05/25/2024 d/t device beeping.  Advised at that time that her device had probably reached ERI, and ok to transmit when she got back home.  She was visiting family in [place].  Per Pt her family was concerned and urged her to go to ER.  She went to ER where her device was interrogated and she had reached ERI 05/23/2024.  Per Pt she was then admitted and advised she had no battery on her ICD and no protection.  She has been admitted since 05/25/2024 and will undergo gen change today 05/27/2024.    Advised Pt that she still had 3 months left on her battery and no urgency to change the battery.  She states since she has already been admitted she will stay to have her battery changed out.  Scheduled Pt for post gen change wound check.  Advised to call if any questions post gen change.

## 2024-05-27 NOTE — Telephone Encounter (Signed)
 Patient is calling to speak to someone in this department. Please advise

## 2024-05-28 NOTE — Progress Notes (Signed)
 Remote ICD Transmission

## 2024-06-02 ENCOUNTER — Encounter: Payer: Self-pay | Admitting: Rheumatology

## 2024-06-02 ENCOUNTER — Ambulatory Visit: Attending: Rheumatology | Admitting: Rheumatology

## 2024-06-02 VITALS — BP 111/77 | HR 91 | Temp 97.7°F | Resp 13 | Ht 66.5 in | Wt 211.0 lb

## 2024-06-02 DIAGNOSIS — M79671 Pain in right foot: Secondary | ICD-10-CM | POA: Diagnosis present

## 2024-06-02 DIAGNOSIS — M7062 Trochanteric bursitis, left hip: Secondary | ICD-10-CM | POA: Insufficient documentation

## 2024-06-02 DIAGNOSIS — Z96653 Presence of artificial knee joint, bilateral: Secondary | ICD-10-CM | POA: Insufficient documentation

## 2024-06-02 DIAGNOSIS — M797 Fibromyalgia: Secondary | ICD-10-CM | POA: Insufficient documentation

## 2024-06-02 DIAGNOSIS — Z8673 Personal history of transient ischemic attack (TIA), and cerebral infarction without residual deficits: Secondary | ICD-10-CM | POA: Insufficient documentation

## 2024-06-02 DIAGNOSIS — Z8719 Personal history of other diseases of the digestive system: Secondary | ICD-10-CM | POA: Insufficient documentation

## 2024-06-02 DIAGNOSIS — M19042 Primary osteoarthritis, left hand: Secondary | ICD-10-CM | POA: Diagnosis present

## 2024-06-02 DIAGNOSIS — Z8669 Personal history of other diseases of the nervous system and sense organs: Secondary | ICD-10-CM | POA: Insufficient documentation

## 2024-06-02 DIAGNOSIS — Z8659 Personal history of other mental and behavioral disorders: Secondary | ICD-10-CM | POA: Insufficient documentation

## 2024-06-02 DIAGNOSIS — M19041 Primary osteoarthritis, right hand: Secondary | ICD-10-CM | POA: Diagnosis present

## 2024-06-02 DIAGNOSIS — Z853 Personal history of malignant neoplasm of breast: Secondary | ICD-10-CM | POA: Diagnosis present

## 2024-06-02 DIAGNOSIS — G8929 Other chronic pain: Secondary | ICD-10-CM | POA: Insufficient documentation

## 2024-06-02 DIAGNOSIS — Z8639 Personal history of other endocrine, nutritional and metabolic disease: Secondary | ICD-10-CM | POA: Insufficient documentation

## 2024-06-02 DIAGNOSIS — Z8679 Personal history of other diseases of the circulatory system: Secondary | ICD-10-CM | POA: Diagnosis present

## 2024-06-02 DIAGNOSIS — Z8619 Personal history of other infectious and parasitic diseases: Secondary | ICD-10-CM | POA: Insufficient documentation

## 2024-06-02 DIAGNOSIS — M25552 Pain in left hip: Secondary | ICD-10-CM | POA: Diagnosis present

## 2024-06-02 DIAGNOSIS — J84112 Idiopathic pulmonary fibrosis: Secondary | ICD-10-CM | POA: Insufficient documentation

## 2024-06-02 DIAGNOSIS — M62838 Other muscle spasm: Secondary | ICD-10-CM | POA: Insufficient documentation

## 2024-06-02 DIAGNOSIS — M25512 Pain in left shoulder: Secondary | ICD-10-CM | POA: Diagnosis not present

## 2024-06-02 DIAGNOSIS — G2581 Restless legs syndrome: Secondary | ICD-10-CM | POA: Diagnosis present

## 2024-06-02 DIAGNOSIS — M5030 Other cervical disc degeneration, unspecified cervical region: Secondary | ICD-10-CM | POA: Insufficient documentation

## 2024-06-02 DIAGNOSIS — I671 Cerebral aneurysm, nonruptured: Secondary | ICD-10-CM | POA: Diagnosis present

## 2024-06-02 NOTE — Patient Instructions (Signed)
Hip Exercises Ask your health care provider which exercises are safe for you. Do exercises exactly as told by your provider and adjust them as told. It is normal to feel mild stretching, pulling, tightness, or discomfort as you do these exercises. Stop right away if you feel sudden pain or your pain gets worse. Do not begin these exercises until told by your provider. Stretching and range-of-motion exercises These exercises warm up your muscles and joints and improve the movement and flexibility of your hip. They also help to relieve pain, numbness, and tingling. You may be asked to limit your range of motion if you had a hip replacement. Talk to your provider about these limits. Hamstrings, supine  Lie on your back (supine position). Loop a belt, towel, or exercise band over the ball of your left / right foot. The ball of your foot is on the walking surface, right under your toes. Straighten your left / right knee and slowly pull on the belt, towel, or band to raise your leg until you feel a gentle stretch behind your knee (hamstring). Do not let your knee bend while you do this. Keep your other leg flat on the floor. Hold this position for __________ seconds. Slowly return your leg to the starting position. Repeat __________ times. Complete this exercise __________ times a day. Hip rotation  Lie on your back on a firm surface. With your left / right hand, gently pull your left / right knee toward the shoulder that is on the same side of the body. Stop when your knee is pointing toward the ceiling. Hold your left / right ankle with your other hand. Keeping your knee steady, gently pull your left / right ankle toward your other shoulder until you feel a stretch in your butt. Keep your hips and shoulders firmly planted while you do this stretch. Hold this position for __________ seconds. Repeat __________ times. Complete this exercise __________ times a day. Seated stretch This exercise is  sometimes called hamstrings and adductors stretch. Sit on the floor with your legs stretched wide. Keep your knees straight during this exercise. Keeping your head and back in a straight line, bend at your waist to reach for your left foot (position A). You should feel a stretch in your right inner thigh (adductors). Hold this position for __________ seconds. Then slowly return to the upright position. Keeping your head and back in a straight line, bend at your waist to reach forward (position B). You should feel a stretch behind both of your thighs and knees (hamstrings). Hold this position for __________ seconds. Then slowly return to the upright position. Keeping your head and back in a straight line, bend at your waist to reach for your right foot (position C). You should feel a stretch in your left inner thigh (adductors). Hold this position for __________ seconds. Then slowly return to the upright position. Repeat __________ times. Complete this exercise __________ times a day. Lunge This exercise stretches the muscles of the hip (hip flexors). Place your left / right knee on the floor and bend your other knee so that is directly over your ankle. You should be half-kneeling. Keep good posture with your head over your shoulders. Tighten your butt muscles to point your tailbone downward. This will prevent your back from arching too much. You should feel a gentle stretch in the front of your left / right thigh and hip. If you do not feel a stretch, slide your other foot forward slightly and  then slowly lunge forward with your chest up until your knee once again lines up over your ankle. Make sure your tailbone continues to point downward. Hold this position for __________ seconds. Slowly return to the starting position. Repeat __________ times. Complete this exercise __________ times a day. Strengthening exercises These exercises build strength and endurance in your hip. Endurance is the  ability to use your muscles for a long time, even after they get tired. Bridge This exercise strengthens the muscles of your hip (hip extensors). Lie on your back on a firm surface with your knees bent and your feet flat on the floor. Tighten your butt muscles and lift your bottom off the floor until the trunk of your body and your hips are level with your thighs. Do not arch your back. You should feel the muscles working in your butt and the back of your thighs. If you do not feel these muscles, slide your feet 1-2 inches (2.5-5 cm) farther away from your butt. Hold this position for __________ seconds. Slowly lower your hips to the starting position. Let your muscles relax completely between repetitions. Repeat __________ times. Complete this exercise __________ times a day. Straight leg raises, side-lying This exercise strengthens the muscles that move the hip joint away from the center of the body (hip abductors). Lie on your side with your left / right leg in the top position. Lie so your head, shoulder, hip, and knee line up. You may bend your bottom knee slightly to help you balance. Roll your hips slightly forward, so your hips are stacked directly over each other and your left / right knee is facing forward. Leading with your heel, lift your top leg 4-6 inches (10-15 cm). You should feel the muscles in your top hip lifting. Do not let your foot drift forward. Do not let your knee roll toward the ceiling. Hold this position for __________ seconds. Slowly return to the starting position. Let your muscles relax completely between repetitions. Repeat __________ times. Complete this exercise __________ times a day. Straight leg raises, side-lying This exercise strengthens the muscles that move the hip joint toward the center of the body (hip adductors). Lie on your side with your left / right leg in the bottom position. Lie so your head, shoulder, hip, and knee line up. You may place your  upper foot in front to help you balance. Roll your hips slightly forward, so your hips are stacked directly over each other and your left / right knee is facing forward. Tense the muscles in your inner thigh and lift your bottom leg 4-6 inches (10-15 cm). Hold this position for __________ seconds. Slowly return to the starting position. Let your muscles relax completely between repetitions. Repeat __________ times. Complete this exercise __________ times a day. Straight leg raises, supine This exercise strengthens the muscles in the front of your thigh (quadriceps and hip flexors). Lie on your back (supine position) with your left / right leg extended and your other knee bent. Tense the muscles in the front of your left / right thigh. You should see your kneecap slide up or see increased dimpling just above your knee. Keep these muscles tight as you raise your leg 4-6 inches (10-15 cm) off the floor. Do not let your knee bend. Hold this position for __________ seconds. Keep these muscles tense as you lower your leg. Relax the muscles slowly and completely between repetitions. Repeat __________ times. Complete this exercise __________ times a day. Hip abductors, standing This  exercise strengthens the muscles that move the leg and hip joint away from the center of the body (hip abductors). Tie one end of a rubber exercise band or tubing to a secure surface, such as a chair, table, or pole. Loop the other end of the band or tubing around your left / right ankle. Keeping your ankle with the band or tubing directly opposite the secured end, step away until there is tension in the tubing or band. Hold on to a chair, table, or pole as needed for balance. Lift your left / right leg out to your side. While you do this: Keep your back upright. Keep your shoulders over your hips. Keep your toes pointing forward. Make sure to use your hip muscles to slowly lift your leg. Do not tip your body or  forcefully lift your leg. Hold this position for __________ seconds. Slowly return to the starting position. Repeat __________ times. Complete this exercise __________ times a day. Squats This exercise strengthens the muscles in the front of your thigh (quadriceps). Stand in front of a table, or stand in a doorframe so your feet and knees are in line with the frame. You may place your hands on the table or frame for balance. Slowly bend your knees and lower your hips like you are going to sit in a chair. Keep your lower legs in a straight up-and-down position. Do not let your hips go lower than your knees. Do not bend your knees lower than told by your provider. If your hip pain increases, do not bend as low. Hold this position for ___________ seconds. Slowly push with your legs to return to standing. Do not use your hands to pull yourself to standing. Repeat __________ times. Complete this exercise __________ times a day. This information is not intended to replace advice given to you by your health care provider. Make sure you discuss any questions you have with your health care provider. Document Revised: 04/10/2022 Document Reviewed: 04/10/2022 Elsevier Patient Education  2024 [employer].

## 2024-06-02 NOTE — Telephone Encounter (Signed)
 See subsequent encounter.  Pt has gen change in [place].  Scheduled for wound check here.

## 2024-06-04 ENCOUNTER — Encounter

## 2024-06-05 ENCOUNTER — Encounter: Payer: Self-pay | Admitting: Internal Medicine

## 2024-06-05 ENCOUNTER — Encounter: Admitting: Pulmonary Disease

## 2024-06-05 ENCOUNTER — Ambulatory Visit: Attending: Internal Medicine | Admitting: Internal Medicine

## 2024-06-05 VITALS — BP 138/82 | HR 94 | Ht 66.5 in | Wt 208.6 lb

## 2024-06-05 DIAGNOSIS — I428 Other cardiomyopathies: Secondary | ICD-10-CM | POA: Insufficient documentation

## 2024-06-05 DIAGNOSIS — I5022 Chronic systolic (congestive) heart failure: Secondary | ICD-10-CM | POA: Insufficient documentation

## 2024-06-05 DIAGNOSIS — Z006 Encounter for examination for normal comparison and control in clinical research program: Secondary | ICD-10-CM

## 2024-06-05 DIAGNOSIS — I472 Ventricular tachycardia, unspecified: Secondary | ICD-10-CM | POA: Insufficient documentation

## 2024-06-05 DIAGNOSIS — I10 Essential (primary) hypertension: Secondary | ICD-10-CM | POA: Diagnosis present

## 2024-06-05 LAB — BASIC METABOLIC PANEL WITH GFR
BUN/Creatinine Ratio: 14 (ref 12–28)
BUN: 12 mg/dL (ref 8–27)
CO2: 23 mmol/L (ref 20–29)
Calcium: 9.9 mg/dL (ref 8.7–10.3)
Chloride: 104 mmol/L (ref 96–106)
Creatinine, Ser: 0.83 mg/dL (ref 0.57–1.00)
Glucose: 101 mg/dL — ABNORMAL HIGH (ref 70–99)
Potassium: 4.8 mmol/L (ref 3.5–5.2)
Sodium: 140 mmol/L (ref 134–144)
eGFR: 73 mL/min/1.73 (ref >59)

## 2024-06-05 NOTE — Patient Instructions (Signed)
 Medication Instructions:  No changes *If you need a refill on your cardiac medications before your next appointment, please call your pharmacy*  Lab Work: BMP- today If you have labs (blood work) drawn today and your tests are completely normal, you will receive your results only by: MyChart Message (if you have MyChart) OR A paper copy in the mail If you have any lab test that is abnormal or we need to change your treatment, we will call you to review the results.  Testing/Procedures: None ordered  Follow-Up: At [facility], you and your health needs are our priority.  As part of our continuing mission to provide you with exceptional heart care, our providers are all part of one team.  This team includes your primary Cardiologist (physician) and Advanced Practice Providers or APPs (Physician Assistants and Nurse Practitioners) who all work together to provide you with the care you need, when you need it.  Your next appointment:   3 month(s)  Provider:   Dr Kriste  We recommend signing up for the patient portal called MyChart.  Sign up information is provided on this After Visit Summary.  MyChart is used to connect with patients for Virtual Visits (Telemedicine).  Patients are able to view lab/test results, encounter notes, upcoming appointments, etc.  Non-urgent messages can be sent to your provider as well.   To learn more about what you can do with MyChart, go to [email or web address].

## 2024-06-05 NOTE — Progress Notes (Signed)
 Cardiology Office Note   Date:  06/05/2024  ID:  [name], DOB [birth year], MRN [id number] PCP: [name]  [facility] Providers Cardiologist:  None Electrophysiologist:  Adriana Adriana HOLTS, MD     History of Present Illness Jakyah Bradby is a 73 y.o. female is a 73 year old female with past medical history of hypertension, mitral valve prolapse, VT, cerebral aneurysm, PFO, nonischemic cardiomyopathy s/p ICD [facility] single-chamber ICD s/p generator change in [place] in 05/27/2024), pulmonary embolism, IPF, hypothyroidism, statin intolerance, ACE inhibitor allergy for follow-up to discuss medication changes from her recent hospitalization.  Patient was recently notified that her device reached ERI on 10/4 and she presented to the ED ER in [place].  She underwent GEN change on 05/27/2024.  At discharge, her Cardizem , Flonase  and nasal saline spray were discontinued and she was started on carvedilol  3.125 mg twice daily.  She comes in today to discuss these medication changes.  Overall, the patient is doing well since that time without any fevers or postop site redness.  She has noted some palpitations since starting the carvedilol .  Also, she has been having yeast infections and had UTIs in the past.  We discussed discontinuing Jardiance  but she wishes to continue for now.  ROS:  Review of Systems  All other systems reviewed and are negative.   Physical Exam  Physical Exam Vitals and nursing note reviewed.  Constitutional:      Appearance: Normal appearance.  HENT:     Head: Normocephalic and atraumatic.  Eyes:     Conjunctiva/sclera: Conjunctivae normal.  Neck:     Vascular: No carotid bruit.  Cardiovascular:     Rate and Rhythm: Normal rate and regular rhythm.  Pulmonary:     Effort: Pulmonary effort is normal.     Breath sounds: Rales present.  Musculoskeletal:     Comments: Left chest postop site with Steri-Strips in place.   No erythema or purulence or discharge  Skin:    Coloration: Skin is not jaundiced or pale.  Neurological:     Mental Status: She is alert.     VS:  BP 138/82   Pulse 94   Ht 5' 6.5 (1.689 m)   Wt 208 lb 9.6 oz (94.6 kg)   LMP  (LMP Unknown)   SpO2 95%   BMI 33.16 kg/m         Wt Readings from Last 3 Encounters:  06/05/24 208 lb 9.6 oz (94.6 kg)  06/02/24 211 lb (95.7 kg)  04/27/24 208 lb (94.3 kg)         Studies Reviewed  Echo 02/04/2024: Left ventricular ejection fraction, by estimation, is 45 to 50%. The  left ventricle has mildly decreased function. The left ventricle  demonstrates global hypokinesis. There is mild concentric left ventricular  hypertrophy. Left ventricular diastolic  parameters are consistent with Grade I diastolic dysfunction (impaired relaxation).  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  We discussed discontinuing Jardiance  due to yeast infections but she wishes to continue and think about it for now We discussed starting spironolactone.  She is open to the idea of starting. NICM s/p [employer] single-chamber ICD s/p generator change in [place] 05/27/2024  Hypertension Elevated CK unclear etiology. Began trending up around the  time she started Repatha . It's unclear if this is the etiology or not at this time.  She is currently trialing off of Repatha  to see if the CK improves.  If the CK improves off of Repatha  then we will refer her to the lipid clinic.  If it continues to remain elevated or trends up, will restart Repatha  and have the patient follow-up with rheumatology for further workup OSA Statin intolerance trialing off Repatha  as above Hyperlipidemia trial off Repatha  due to elevated CK and following up Coronary artery calcification by CT PFO Chronic anticoagulation managed by pulmonology History of idiopathic pulmonary fibrosis crackles on exam. Follows with pulmonology History of pulmonary embolism in 2024 History of cerebral aneurysm s/p clip History of Mitral valve prolapse not noted on most recent echo History of VT s/p ICD  Plan  Will check a BMP and if potassium and renal function is controlled then we will start on spironolactone 12.5 mg daily and get a follow-up BMP 2 weeks after We had an extensive discussion regarding stopping Jardiance  given her history of recurrent yeast infections and UTIs.  After discussion, we decided to follow-up on the echo which is scheduled for next week and if her ejection fraction has improved or remained stable then we will consider discontinuing at that time Patient has an echocardiogram and right heart cath for pulmonary hypertension workup on 10/22 and 10/30, respectively.  This is for a research trial and schedule through the pulmonologist Follow-up with CK level as previously discussed and make changes to Repatha /lipid management as above Has a postop GEN change appointment next week  Follow up: 3 months           Signed, Emeline FORBES Calender, MD

## 2024-06-05 NOTE — H&P (View-Only) (Signed)
 Cardiology Office Note   Date:  06/05/2024  ID:  [name], DOB [birth year], MRN [id number] PCP: Leonce Carola [name]  [facility] Providers Cardiologist:  None Electrophysiologist:  OLE [name], MD     History of Present Illness [name] is a 73 y.o. female is a 73 year old female with past medical history of hypertension, mitral valve prolapse, VT, cerebral aneurysm, PFO, nonischemic cardiomyopathy s/p ICD [facility] single-chamber ICD s/p generator change in [place] in 05/27/2024), pulmonary embolism, IPF, hypothyroidism, statin intolerance, ACE inhibitor allergy for follow-up to discuss medication changes from her recent hospitalization.  Patient was recently notified that her device reached ERI on 10/4 and she presented to the ED ER in [place].  She underwent GEN change on 05/27/2024.  At discharge, her Cardizem , Flonase  and nasal saline spray were discontinued and she was started on carvedilol  3.125 mg twice daily.  She comes in today to discuss these medication changes.  Overall, the patient is doing well since that time without any fevers or postop site redness.  She has noted some palpitations since starting the carvedilol .  Also, she has been having yeast infections and had UTIs in the past.  We discussed discontinuing Jardiance  but she wishes to continue for now.  ROS:  Review of Systems  All other systems reviewed and are negative.   Physical Exam  Physical Exam Vitals and nursing note reviewed.  Constitutional:      Appearance: Normal appearance.  HENT:     Head: Normocephalic and atraumatic.  Eyes:     Conjunctiva/sclera: Conjunctivae normal.  Neck:     Vascular: No carotid bruit.  Cardiovascular:     Rate and Rhythm: Normal rate and regular rhythm.  Pulmonary:     Effort: Pulmonary effort is normal.     Breath sounds: Rales present.  Musculoskeletal:     Comments: Left chest postop site with Steri-Strips in place.   No erythema or purulence or discharge  Skin:    Coloration: Skin is not jaundiced or pale.  Neurological:     Mental Status: She is alert.     VS:  BP 138/82   Pulse 94   Ht 5' 6.5 (1.689 m)   Wt 208 lb 9.6 oz (94.6 kg)   LMP  (LMP Unknown)   SpO2 95%   BMI 33.16 kg/m         Wt Readings from Last 3 Encounters:  06/05/24 208 lb 9.6 oz (94.6 kg)  06/02/24 211 lb (95.7 kg)  04/27/24 208 lb (94.3 kg)         Studies Reviewed  Echo 02/04/2024: Left ventricular ejection fraction, by estimation, is 45 to 50%. The  left ventricle has mildly decreased function. The left ventricle  demonstrates global hypokinesis. There is mild concentric left ventricular  hypertrophy. Left ventricular diastolic  parameters are consistent with Grade I diastolic dysfunction (impaired relaxation).  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  We discussed discontinuing Jardiance  due to yeast infections but she wishes to continue and think about it for now We discussed starting spironolactone.  She is open to the idea of starting. NICM s/p [employer] single-chamber ICD s/p generator change in [place] 05/27/2024  Hypertension Elevated CK unclear etiology. Began trending up around the  time she started Repatha . It's unclear if this is the etiology or not at this time.  She is currently trialing off of Repatha  to see if the CK improves.  If the CK improves off of Repatha  then we will refer her to the lipid clinic.  If it continues to remain elevated or trends up, will restart Repatha  and have the patient follow-up with rheumatology for further workup OSA Statin intolerance trialing off Repatha  as above Hyperlipidemia trial off Repatha  due to elevated CK and following up Coronary artery calcification by CT PFO Chronic anticoagulation managed by pulmonology History of idiopathic pulmonary fibrosis crackles on exam. Follows with pulmonology History of pulmonary embolism in 2024 History of cerebral aneurysm s/p clip History of Mitral valve prolapse not noted on most recent echo History of VT s/p ICD  Plan  Will check a BMP and if potassium and renal function is controlled then we will start on spironolactone 12.5 mg daily and get a follow-up BMP 2 weeks after We had an extensive discussion regarding stopping Jardiance  given her history of recurrent yeast infections and UTIs.  After discussion, we decided to follow-up on the echo which is scheduled for next week and if her ejection fraction has improved or remained stable then we will consider discontinuing at that time Patient has an echocardiogram and right heart cath for pulmonary hypertension workup on 10/22 and 10/30, respectively.  This is for a research trial and schedule through the pulmonologist Follow-up with CK level as previously discussed and make changes to Repatha /lipid management as above Has a postop GEN change appointment next week  Follow up: 3 months           Signed, Emeline FORBES Calender, MD

## 2024-06-08 ENCOUNTER — Encounter: Attending: Physical Medicine and Rehabilitation | Admitting: Physical Medicine and Rehabilitation

## 2024-06-08 ENCOUNTER — Ambulatory Visit: Payer: Self-pay | Admitting: Internal Medicine

## 2024-06-08 ENCOUNTER — Encounter: Payer: Self-pay | Admitting: Physical Medicine and Rehabilitation

## 2024-06-08 VITALS — BP 138/86 | HR 91 | Ht 66.5 in | Wt 208.4 lb

## 2024-06-08 DIAGNOSIS — G894 Chronic pain syndrome: Secondary | ICD-10-CM | POA: Diagnosis not present

## 2024-06-08 DIAGNOSIS — G8929 Other chronic pain: Secondary | ICD-10-CM | POA: Insufficient documentation

## 2024-06-08 DIAGNOSIS — I5022 Chronic systolic (congestive) heart failure: Secondary | ICD-10-CM

## 2024-06-08 DIAGNOSIS — Z5181 Encounter for therapeutic drug level monitoring: Secondary | ICD-10-CM | POA: Diagnosis not present

## 2024-06-08 DIAGNOSIS — M7918 Myalgia, other site: Secondary | ICD-10-CM | POA: Insufficient documentation

## 2024-06-08 DIAGNOSIS — Z79899 Other long term (current) drug therapy: Secondary | ICD-10-CM

## 2024-06-08 DIAGNOSIS — M19012 Primary osteoarthritis, left shoulder: Secondary | ICD-10-CM | POA: Insufficient documentation

## 2024-06-08 MED ORDER — OXYCODONE HCL 5 MG PO TABS: 2.5000 mg | ORAL_TABLET | Freq: Two times a day (BID) | ORAL | 0 refills | Status: AC | PRN

## 2024-06-08 MED ORDER — TRIAMCINOLONE ACETONIDE 40 MG/ML IJ SUSP
40.0000 mg | Freq: Once | INTRAMUSCULAR | Status: AC
  Administered 2024-06-08: 40 mg via INTRA_ARTICULAR

## 2024-06-08 MED ORDER — LIDOCAINE HCL 1 % IJ SOLN
4.0000 mL | Freq: Once | INTRAMUSCULAR | Status: AC
  Administered 2024-06-08: 4 mL

## 2024-06-08 NOTE — Progress Notes (Signed)
  Patient is a 73 yr old R handed female with hx of  Bronchiesctasis, CHF, prediabetes- with A1c of 5.6- as of 02/15/21- - pt thinks still true- - not on blood thinners, has a defibrillator, GERD, and fibromyalgia issues- dx'd 10 years ago.  Here for f/u on chronic pain and trigger oint injections. Last got subacromial shoulder injections B/L on 03/29/21.  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  Here for f/u on chronic pain.      Nephew passed away- 32 yr old- CHF- Stayed at hospice with him 7 days.  Didn't leave. So Much stress- and that spiked BP  Worried about Steroid spiking BP.   Got pacemaker, defibrillator replaced- was done  05/28/24.  Device hit time to be replaced.  Started beeping.     Plan:  Needs refill of Oxycodone - last refill 11/15/23- has 8 pills left- needs that refill-    2. Patient here for trigger point injections for L shoulder pain  Consent done and on chart.  Cleaned areas with alcohol  and injected using a 27 gauge 1.5 inch needle  Injected 2 cc- 1cc wasted Using 1% Lidocaine  with no EPI  Upper traps L only x2 Levators L only Posterior scalenes Middle scalenes Splenius Capitus Pectoralis Major Rhomboids- L only Infraspinatus Teres Major/minor Thoracic paraspinals Lumbar paraspinals Other injections-    Patient's level of pain prior was Current level of pain after injections is- already more relaxed  There was no bleeding or complications.  Patient was advised to drink a lot of water  on day after injections to flush system Will have increased soreness for 12-48 hours after injections.  Can use Lidocaine  patches the day AFTER injections Can use theracane on day of injections in places didn't inject Can use heating pad 4-6 hours AFTER injections   3. steroid injection was performed at  L RTC/posterior shoulder using 1% plain Lidocaine  and 40mg  /1cc of Kenalog . This was well tolerated.  Cleaned with betadine  x3  and allowed to dry- then alcohol  then injected using 27 gauge 1.5 inch needle- no bleeding or complications.    F/U in 3 months for steroid injections of  L posterior shoulder.  Lidocaine  will kick in 15 minutes- and wear off tonight- the steroid will kick in tomorrow within 24 hours and take up to 72 hours to fully kick in.  4. F/U in for L shoulder injection and trP injextions- take [place] meds rarely, so can f/u q 3months  5. Oral drug screen today.    I spent a total of  26  minutes on total care today- >50% coordination of care- due to  L shoulder and trP injections- and d/w pt about drug screen and refills of pain meds- we also d/w CBD- cannot use with THC.

## 2024-06-08 NOTE — Patient Instructions (Signed)
 Plan:  Needs refill of Oxycodone - last refill 11/15/23- has 8 pills left- needs that refill-    2. Patient here for trigger point injections for L shoulder pain  Consent done and on chart.  Cleaned areas with alcohol  and injected using a 27 gauge 1.5 inch needle  Injected 2 cc- 1cc wasted Using 1% Lidocaine  with no EPI  Upper traps L only x2 Levators L only Posterior scalenes Middle scalenes Splenius Capitus Pectoralis Major Rhomboids- L only Infraspinatus Teres Major/minor Thoracic paraspinals Lumbar paraspinals Other injections-    Patient's level of pain prior was Current level of pain after injections is- already more relaxed  There was no bleeding or complications.  Patient was advised to drink a lot of water  on day after injections to flush system Will have increased soreness for 12-48 hours after injections.  Can use Lidocaine  patches the day AFTER injections Can use theracane on day of injections in places didn't inject Can use heating pad 4-6 hours AFTER injections   3. steroid injection was performed at  L RTC/posterior shoulder using 1% plain Lidocaine  and 40mg  /1cc of Kenalog . This was well tolerated.  Cleaned with betadine  x3 and allowed to dry- then alcohol  then injected using 27 gauge 1.5 inch needle- no bleeding or complications.    F/U in 3 months for steroid injections of  L posterior shoulder.  Lidocaine  will kick in 15 minutes- and wear off tonight- the steroid will kick in tomorrow within 24 hours and take up to 72 hours to fully kick in.  4. F/U in for L shoulder injection and trP injextions- take [place] meds rarely, so can f/u q 3months  5. Oral drug screen today.

## 2024-06-08 NOTE — Addendum Note (Signed)
 Addended by: Joylyn Duggin W on: 06/08/2024 12:05 PM   Modules accepted: Orders

## 2024-06-09 ENCOUNTER — Ambulatory Visit: Attending: Internal Medicine

## 2024-06-09 DIAGNOSIS — I428 Other cardiomyopathies: Secondary | ICD-10-CM | POA: Diagnosis present

## 2024-06-09 DIAGNOSIS — I472 Ventricular tachycardia, unspecified: Secondary | ICD-10-CM | POA: Insufficient documentation

## 2024-06-09 LAB — CUP PACEART INCLINIC DEVICE CHECK
Date Time Interrogation Session: 20251021091428
HighPow Impedance: 59 Ohm
Implantable Lead Connection Status: 753985
Implantable Lead Implant Date: 20050603
Implantable Lead Location: 753860
Implantable Lead Model: 185
Implantable Lead Serial Number: 116340
Implantable Pulse Generator Implant Date: 20251008
Lead Channel Impedance Value: 594 Ohm
Lead Channel Pacing Threshold Amplitude: 0.8 V
Lead Channel Pacing Threshold Pulse Width: 0.4 ms
Lead Channel Sensing Intrinsic Amplitude: 9.7 mV
Lead Channel Setting Pacing Amplitude: 2.4 V
Lead Channel Setting Pacing Pulse Width: 0.4 ms
Lead Channel Setting Sensing Sensitivity: 0.4 mV
Pulse Gen Serial Number: 224900
Zone Setting Status: 755011

## 2024-06-09 NOTE — Progress Notes (Signed)
 Normal single chamber ICD wound check post generator change. Wound well healed. Presenting rhythm: VS 68. Routine testing performed.  Thresholds, sensing, and impedance demonstrate stable parameters. No treated arrhythmias. Estimated longevity 15 years. Reviewed shock plan.  Pt enrolled in remote follow-up. Increase HV therapies to max output for GORE lead.  Will have Pt follow up with physician to discuss reversed polarity.

## 2024-06-09 NOTE — Patient Instructions (Signed)
   After Your ICD (Implantable Cardiac Defibrillator)    Monitor your defibrillator site for redness, swelling, and drainage. Call the device clinic at [phone] if you experience these symptoms or fever/chills.  Your incision was closed with Steri-strips or staples:  You may shower 7 days after your procedure and wash your incision with soap and water. Avoid lotions, ointments, or perfumes over your incision until it is well-healed.  You may use a hot tub or a pool after your wound check appointment if the incision is completely closed.  There are no restrictions in arm movement after your wound check appointment.   Your ICD is designed to protect you from life threatening heart rhythms. Because of this, you may receive a shock.   1 shock with no symptoms:  Call the office during business hours. 1 shock with symptoms (chest pain, chest pressure, dizziness, lightheadedness, shortness of breath, overall feeling unwell):  Call 911. If you experience 2 or more shocks in 24 hours:  Call 911. If you receive a shock, you should not drive.  [place] DMV - no driving for 6 months if you receive appropriate therapy from your ICD.   ICD Alerts:  Some alerts are vibratory and others beep. These are NOT emergencies. Please call our office to let us know. If this occurs at night or on weekends, it can wait until the next business day. Send a remote transmission.  If your device is capable of reading fluid status (for heart failure), you will be offered monthly monitoring to review this with you.   Remote monitoring is used to monitor your ICD from home. This monitoring is scheduled every 91 days by our office. It allows [place] to keep an eye on the functioning of your device to ensure it is working properly. You will routinely see your Electrophysiologist annually (more often if necessary).

## 2024-06-10 ENCOUNTER — Ambulatory Visit (HOSPITAL_COMMUNITY)
Admission: RE | Admit: 2024-06-10 | Discharge: 2024-06-10 | Disposition: A | Discharge status: Home / Self Care | Payer: Self-pay | Source: Ambulatory Visit | Attending: Pulmonary Disease | Admitting: Pulmonary Disease

## 2024-06-11 ENCOUNTER — Encounter

## 2024-06-11 ENCOUNTER — Ambulatory Visit (INDEPENDENT_AMBULATORY_CARE_PROVIDER_SITE_OTHER)

## 2024-06-11 DIAGNOSIS — I428 Other cardiomyopathies: Secondary | ICD-10-CM | POA: Diagnosis not present

## 2024-06-11 LAB — CUP PACEART REMOTE DEVICE CHECK
Battery Remaining Longevity: 180 mo
Battery Remaining Percentage: 100 %
Brady Statistic RV Percent Paced: 0 %
Date Time Interrogation Session: 20251022111800
HighPow Impedance: 59 Ohm
Implantable Lead Connection Status: 753985
Implantable Lead Implant Date: 20050603
Implantable Lead Location: 753860
Implantable Lead Model: 185
Implantable Lead Serial Number: 116340
Implantable Pulse Generator Implant Date: 20251008
Lead Channel Impedance Value: 615 Ohm
Lead Channel Pacing Threshold Amplitude: 0.8 V
Lead Channel Pacing Threshold Pulse Width: 0.4 ms
Lead Channel Setting Pacing Amplitude: 2.4 V
Lead Channel Setting Pacing Pulse Width: 0.4 ms
Lead Channel Setting Sensing Sensitivity: 0.4 mV
Pulse Gen Serial Number: 224900
Zone Setting Status: 755011

## 2024-06-11 LAB — DRUG TOX MONITOR 1 W/CONF, ORAL FLD

## 2024-06-11 LAB — DRUG TOX ALC METAB W/CON, ORAL FLD: Alcohol Metabolite: NEGATIVE ng/mL (ref <25)

## 2024-06-12 MED ORDER — CARVEDILOL 3.125 MG PO TABS: 3.1250 mg | ORAL_TABLET | Freq: Two times a day (BID) | ORAL | 3 refills | Status: DC

## 2024-06-12 MED ORDER — SPIRONOLACTONE 25 MG PO TABS: 12.5000 mg | ORAL_TABLET | Freq: Every day | ORAL | 3 refills | Status: DC

## 2024-06-13 NOTE — Progress Notes (Signed)
 Remote ICD Transmission

## 2024-06-15 ENCOUNTER — Ambulatory Visit: Payer: Self-pay | Admitting: Speech Pathology

## 2024-06-15 ENCOUNTER — Telehealth (HOSPITAL_BASED_OUTPATIENT_CLINIC_OR_DEPARTMENT_OTHER): Payer: Self-pay

## 2024-06-15 ENCOUNTER — Ambulatory Visit: Payer: Self-pay | Admitting: Cardiology

## 2024-06-15 ENCOUNTER — Encounter: Payer: Self-pay | Admitting: Cardiology

## 2024-06-15 NOTE — Progress Notes (Signed)
 PERIOPERATIVE PRESCRIPTION FOR IMPLANTED CARDIAC DEVICE PROGRAMMING   Patient Information:  Patient: [name]  MRN: [id number]  Date of Birth: 07-26-1951   Procedure:  Cataract left eye with Ganiotomy Date of Surgery:  Clearance 07/09/24                              Surgeon:        Dr. Octavia Schneider Surgeon's Group or Practice Name:  [facility]  Phone number:  [phone] Fax number:  [phone]   Device Information:   Clinic EP Physician:   Dr. Ole Holts Device Type:  Defibrillator Manufacturer and Phone #:  [employer]: [phone] Pacemaker Dependent?:  No Date of Last Device Check:  06/10/2024         Normal Device Function?:  Yes     Electrophysiologist's Recommendations:   Have magnet available. Provide continuous ECG monitoring when magnet is used or reprogramming is to be performed.  Procedure may interfere with device function.  Magnet should be placed over device during procedure.  Per Device Clinic Standing Orders, [name]  06/15/2024 1:18 PM

## 2024-06-15 NOTE — Telephone Encounter (Signed)
   Pre-operative Risk Assessment    Patient Name: [name]  DOB: September 06, 1950 MRN: [id number]   Date of last office visit: 06/05/2024 with Emeline Calender, MD Date of next office visit: 09/07/2024 wit Hendrick Calender, MD   Request for Surgical Clearance    Procedure:  Cataract left eye with Ganiotomy  Date of Surgery:  Clearance 07/09/24                                 Surgeon:   Dr. Octavia Schneider Surgeon's Group or Practice Name:  [facility]  Phone number:  [phone] Fax number:  [phone]   Type of Clearance Requested:   - Medical  - Pharmacy:  Hold Apixaban  (Eliquis ) - Request to stop blood thinner YES - How long is it safe? Aspirin  and blood thinners do not need to be discontinued for cataract surgery. However, if requested to stop them, the patient is undergoing a glaucoma procedure.    Type of Anesthesia:  Topical   Additional requests/questions:  PATIENT HAS A [employer] ICD with recent gen change.  Signed, Patrcia Hong L   06/15/2024, 10:10 AM

## 2024-06-16 ENCOUNTER — Ambulatory Visit: Admitting: Pulmonary Disease

## 2024-06-16 NOTE — Telephone Encounter (Signed)
   Patient Name: Malika Demario  DOB: Jul 13, 1951 MRN: [id number]  Primary Cardiologist: None  Chart reviewed as part of pre-operative protocol coverage. Cataract extractions are recognized in guidelines as low risk surgeries that do not typically require specific preoperative testing or holding of blood thinner therapy. Therefore, given past medical history and time since last visit, based on ACC/AHA guidelines, Nailah Luepke would be at acceptable risk for the planned procedure without further cardiovascular testing.   I will route this recommendation to the requesting party via Epic fax function and remove from pre-op pool.  Please call with questions.  Lum LITTIE Louis, NP 06/16/2024, 12:54 PM

## 2024-06-17 NOTE — Telephone Encounter (Signed)
 Emer   [name] our new CRC plans to bethere to be available for you and patient. What itme and where should he come?  Thanks     SIGNATURE    Dr. Dorethia Cave, M.D., F.C.C.P,  Pulmonary and Critical Care Medicine Staff Physician, [facility] Director - Interstitial Lung Disease  Program  Pulmonary Fibrosis [facility] at [facility] [place], [place], [address]   Pager: [phone], If no answer  -> Check AMION or Try [phone] Telephone (clinical office): [phone] Telephone (research): [phone]  3:24 PM 06/17/2024

## 2024-06-18 ENCOUNTER — Other Ambulatory Visit: Payer: Self-pay

## 2024-06-18 ENCOUNTER — Encounter (HOSPITAL_COMMUNITY): Payer: Self-pay | Admitting: Cardiology

## 2024-06-18 ENCOUNTER — Ambulatory Visit (HOSPITAL_COMMUNITY)
Admission: RE | Admit: 2024-06-18 | Discharge: 2024-06-18 | Disposition: A | Discharge status: Home / Self Care | Payer: Self-pay | Attending: Cardiology | Admitting: Cardiology

## 2024-06-18 ENCOUNTER — Encounter (HOSPITAL_COMMUNITY)
Admission: RE | Disposition: A | Discharge status: Home / Self Care | Payer: Self-pay | Source: Home / Self Care | Attending: Cardiology

## 2024-06-18 ENCOUNTER — Other Ambulatory Visit (HOSPITAL_COMMUNITY): Payer: Self-pay

## 2024-06-18 DIAGNOSIS — E039 Hypothyroidism, unspecified: Secondary | ICD-10-CM | POA: Insufficient documentation

## 2024-06-18 DIAGNOSIS — I428 Other cardiomyopathies: Secondary | ICD-10-CM | POA: Insufficient documentation

## 2024-06-18 DIAGNOSIS — Z86711 Personal history of pulmonary embolism: Secondary | ICD-10-CM | POA: Insufficient documentation

## 2024-06-18 DIAGNOSIS — E785 Hyperlipidemia, unspecified: Secondary | ICD-10-CM | POA: Insufficient documentation

## 2024-06-18 DIAGNOSIS — Z8679 Personal history of other diseases of the circulatory system: Secondary | ICD-10-CM | POA: Insufficient documentation

## 2024-06-18 DIAGNOSIS — I270 Primary pulmonary hypertension: Secondary | ICD-10-CM

## 2024-06-18 DIAGNOSIS — J84112 Idiopathic pulmonary fibrosis: Secondary | ICD-10-CM

## 2024-06-18 DIAGNOSIS — Z7901 Long term (current) use of anticoagulants: Secondary | ICD-10-CM | POA: Insufficient documentation

## 2024-06-18 DIAGNOSIS — I272 Pulmonary hypertension, unspecified: Secondary | ICD-10-CM

## 2024-06-18 DIAGNOSIS — I110 Hypertensive heart disease with heart failure: Secondary | ICD-10-CM | POA: Insufficient documentation

## 2024-06-18 DIAGNOSIS — G4733 Obstructive sleep apnea (adult) (pediatric): Secondary | ICD-10-CM | POA: Insufficient documentation

## 2024-06-18 DIAGNOSIS — Z95810 Presence of automatic (implantable) cardiac defibrillator: Secondary | ICD-10-CM | POA: Insufficient documentation

## 2024-06-18 DIAGNOSIS — Z79899 Other long term (current) drug therapy: Secondary | ICD-10-CM | POA: Insufficient documentation

## 2024-06-18 DIAGNOSIS — I341 Nonrheumatic mitral (valve) prolapse: Secondary | ICD-10-CM | POA: Insufficient documentation

## 2024-06-18 DIAGNOSIS — I5022 Chronic systolic (congestive) heart failure: Secondary | ICD-10-CM | POA: Insufficient documentation

## 2024-06-18 DIAGNOSIS — I2721 Secondary pulmonary arterial hypertension: Secondary | ICD-10-CM | POA: Insufficient documentation

## 2024-06-18 HISTORY — PX: RIGHT HEART CATH: CATH118263

## 2024-06-18 LAB — CBC
HCT: 45.7 % (ref 36.0–46.0)
Hemoglobin: 14.7 g/dL (ref 12.0–15.0)
MCH: 30.1 pg (ref 26.0–34.0)
MCHC: 32.2 g/dL (ref 30.0–36.0)
MCV: 93.6 fL (ref 80.0–100.0)
Platelets: 175 K/uL (ref 150–400)
RBC: 4.88 MIL/uL (ref 3.87–5.11)
RDW: 12.7 % (ref 11.5–15.5)
WBC: 4.3 K/uL (ref 4.0–10.5)
nRBC: 0 % (ref 0.0–0.2)

## 2024-06-18 LAB — POCT I-STAT EG7
Acid-Base Excess: 1 mmol/L (ref 0.0–2.0)
Acid-Base Excess: 1 mmol/L (ref 0.0–2.0)
Bicarbonate: 27.2 mmol/L (ref 20.0–28.0)
Bicarbonate: 27.4 mmol/L (ref 20.0–28.0)
Calcium, Ion: 1.36 mmol/L (ref 1.15–1.40)
Calcium, Ion: 1.37 mmol/L (ref 1.15–1.40)
HCT: 43 % (ref 36.0–46.0)
HCT: 44 % (ref 36.0–46.0)
Hemoglobin: 14.6 g/dL (ref 12.0–15.0)
Hemoglobin: 15 g/dL (ref 12.0–15.0)
O2 Saturation: 62 %
O2 Saturation: 64 %
Potassium: 3.8 mmol/L (ref 3.5–5.1)
Potassium: 3.8 mmol/L (ref 3.5–5.1)
Sodium: 142 mmol/L (ref 135–145)
Sodium: 142 mmol/L (ref 135–145)
TCO2: 29 mmol/L (ref 22–32)
TCO2: 29 mmol/L (ref 22–32)
pCO2, Ven: 47.8 mmHg (ref 44–60)
pCO2, Ven: 49.2 mmHg (ref 44–60)
pH, Ven: 7.354 (ref 7.25–7.43)
pH, Ven: 7.362 (ref 7.25–7.43)
pO2, Ven: 34 mmHg (ref 32–45)
pO2, Ven: 35 mmHg (ref 32–45)

## 2024-06-18 SURGERY — RIGHT HEART CATH
Anesthesia: LOCAL

## 2024-06-18 MED ORDER — SODIUM CHLORIDE 0.9 % IV SOLN: 250.0000 mL | INTRAVENOUS | Status: DC | PRN

## 2024-06-18 MED ORDER — SODIUM CHLORIDE 0.9% FLUSH
3.0000 mL | INTRAVENOUS | Status: DC | PRN
Start: 2024-06-18 — End: 2024-06-18

## 2024-06-18 MED ORDER — LIDOCAINE HCL (PF) 1 % IJ SOLN
INTRAMUSCULAR | Status: DC | PRN
Start: 2024-06-18 — End: 2024-06-18
  Administered 2024-06-18: 5 mL via INTRADERMAL

## 2024-06-18 MED ORDER — SODIUM CHLORIDE 0.9% FLUSH: 3.0000 mL | INTRAVENOUS | Status: DC | PRN

## 2024-06-18 MED ORDER — LABETALOL HCL 5 MG/ML IV SOLN: 10.0000 mg | INTRAVENOUS | Status: DC | PRN

## 2024-06-18 MED ORDER — SODIUM CHLORIDE 0.9% FLUSH: 3.0000 mL | Freq: Two times a day (BID) | INTRAVENOUS | Status: DC

## 2024-06-18 MED ORDER — FREE WATER: 250.0000 mL | Freq: Once | Status: DC

## 2024-06-18 MED ORDER — ONDANSETRON HCL 4 MG/2ML IJ SOLN: 4.0000 mg | Freq: Four times a day (QID) | INTRAMUSCULAR | Status: DC | PRN

## 2024-06-18 MED ORDER — HYDRALAZINE HCL 20 MG/ML IJ SOLN: 10.0000 mg | INTRAMUSCULAR | Status: DC | PRN

## 2024-06-18 MED ORDER — LIDOCAINE HCL (PF) 1 % IJ SOLN
INTRAMUSCULAR | Status: AC
  Filled 2024-06-18: qty 30

## 2024-06-18 MED ORDER — ACETAMINOPHEN 325 MG PO TABS: 650.0000 mg | ORAL_TABLET | ORAL | Status: DC | PRN

## 2024-06-18 SURGICAL SUPPLY — 7 items
CATH BALLN WEDGE 5F 110CM (CATHETERS) IMPLANT
PACK CARDIAC CATHETERIZATION (CUSTOM PROCEDURE TRAY) ×2 IMPLANT
SHEATH GLIDE SLENDER 4/5FR (SHEATH) IMPLANT
SHEATH PROBE COVER 6X72 (BAG) IMPLANT
TRANSDUCER W/STOPCOCK (MISCELLANEOUS) IMPLANT
TUBING ART PRESS 72 MALE/FEM (TUBING) IMPLANT
WIRE EMERALD 3MM-J .025X260CM (WIRE) IMPLANT

## 2024-06-18 NOTE — Research (Signed)
 TItle: "PHINDER: Pulmonary Hypertension Screening in Patients with Interstitial Lung Disease for Earlier Detection"   Primary Endpoint To prospectively evaluate screening strategies for pulmonary hypertension (PH) in patients with ILD in an effort to promote awareness and encourage screening for PH in this patient population. Results from this study will be used to identify and weigh specific clinical parameters based on their prognostic significance for right heart catheterization (RHC)-confirmed PH in patients with ILD.   Study Code: GMS-PH-001 Protocol Edition: Amendment 1 dated 13 Nov 2021 Sponsor Name: [employer] Primary Investigator: Dr. Adina Hunsucker   Protocol Version for 11/05/2023 date of 13 Nov 2021  Consent Version for 11/05/2023 date of 02 Apr 2022   Lab Manual: v1 dated 27 Oct 2021    Key Inclusion Criteria:  Patients are eligible if they have a diagnosis of ILD based on computed tomography imaging, including:  Idiopathic interstitial pneumonia, including idiopathic pulmonary fibrosis  Connective tissue disease-associated ILD with forced vital capacity (FVC) <70%  Hypersensitivity pneumonitis  Scleroderma-related ILD  Autoimmune ILD  Nonspecific interstitial pneumonia  Occupational lung disease  Combined pulmonary fibrosis and emphysema   Additionally, patients must meet a total of 2 or more criteria from at least 2 distinct categories below (ie, Category 1, Category 2, Category 3) based on the Investigator's clinical judgment, within 180 days of Screening.   Category 1: Clinical tests: a) Pulmonary function tests (PFTs)  meeting specified study guidelines for DLCO. b) Historical HRCT with specified findings consistent with increased pulmonary arterial pressure.   Category 2: Symptoms, oxygenation, exercise capacity, and brain natriuretic peptide/N-terminal pro-brain natriuretic peptide indicative of worsening status. Category 3:  Echocardiography Specified findings of increased RV systolic pressure or RV enlargement.   If a patient does not have a historical PFT, HRCT, or echocardiogram needed to assess eligibility for the study, these procedures will be performed as part of study screening.   Key Exclusion Criteria:  Patients are excluded if any of the following criteria apply:  Prior RHC with mean pulmonary artery pressure >20 mmHg  Currently on an FDA-approved pulmonary arterial hypertension medication  Diagnosed with chronic obstructive pulmonary disease  Uncontrolled or untreated sleep apnea  Pulmonary embolism within the past 3 months  History of ischemic heart disease or left-sided myocardial dysfunction within 12 months of Screening, defined as left ventricular ejection fraction <40% or pulmonary capillary wedge pressure >15 mmHg   Safety Data: Not Applicable.      [facility] [place] Clinical Research Coordinator note:    This visit for Subject [name] with DOB: 05/19/1964 on 11/05/2023 for the above protocol is Visit/Encounter # screening  and is for purpose of research.    The consent for this encounter is under Protocol Version amendment 1,  Consent Version Revised 14Aug2023 and  is currently IRB approved.    Subject expressed continued interest and consent in continuing as a study subject. Subject confirmed that there was    no change in contact information (e.g. address, telephone, email). Subject thanked for participation in research and contribution to science. In this visit 11/05/2023 the subject will be evaluated by Principal Investigator [facility] . This research coordinator has verified that the above investigator is up to date with his/her training logs.    The Subject was informed that the PI  continues to have oversight of the subject's visits and course through relevant discussions, reviews, and also specifically of this visit by routing of this note to the PI.    1.  This visit  is a key visit of screening. The PI is  available for this visit.     2.  In addition, ahead of the key visit of screeningt the visit and subject were discussed with the PI as part of direct PI oversight.    Consent reviewed, questions answered, and screen visit assessments completed.  Please see subject binder for further details.     Signed by   Octaviano Paras COT, CCRP Clinical Research Coordinator  [facility]  [place], [place]

## 2024-06-18 NOTE — Discharge Instructions (Addendum)
 OK to restart Eliquis  this evening.   Brachial Site Care   This sheet gives you information about how to care for yourself after your procedure. Your health care provider may also give you more specific instructions. If you have problems or questions, contact your health care provider. What can I expect after the procedure? After the procedure, it is common to have: Bruising and tenderness at the catheter insertion area. Follow these instructions at home:  Insertion site care Follow instructions from your health care provider about how to take care of your insertion site. Make sure you: Wash your hands with soap and water  before you change your bandage (dressing). If soap and water  are not available, use hand sanitizer. Remove your dressing as told by your health care provider. In 24 hours Check your insertion site every day for signs of infection. Check for: Redness, swelling, or pain. Pus or a bad smell. Warmth. You may shower 24-48 hours after the procedure. Do not apply powder or lotion to the site.  Activity For 24 hours after the procedure, or as directed by your health care provider: Do not push or pull heavy objects with the affected arm. Do not drive yourself home from the hospital or clinic. You may drive 24 hours after the procedure unless your health care provider tells you not to. Do not lift anything that is heavier than 10 lb (4.5 kg), or the limit that you are told, until your health care provider says that it is safe.  For 24 hours

## 2024-06-18 NOTE — Progress Notes (Signed)
 Discharge instructions reviewed with patient and Judius at the bedside. Denies questions or concerns. PT tolerate PO intake prior to discharge was able to void without difficulty. PT escorted from the unit via wheel chair to personal vehicle.

## 2024-06-18 NOTE — Interval H&P Note (Signed)
 History and Physical Interval Note:  06/18/2024 9:26 AM  [name]  has presented today for surgery, with the diagnosis of fender trial pulmonary HTN.  The various methods of treatment have been discussed with the patient and family. After consideration of risks, benefits and other options for treatment, the patient has consented to  Procedure(s): RIGHT HEART CATH (N/A) as a surgical intervention.  The patient's history has been reviewed, patient examined, no change in status, stable for surgery.  I have reviewed the patient's chart and labs.  Questions were answered to the patient's satisfaction.     Elanore Talcott [employer]

## 2024-06-22 ENCOUNTER — Encounter: Payer: Self-pay | Admitting: Speech Pathology

## 2024-06-22 ENCOUNTER — Ambulatory Visit: Payer: Self-pay | Attending: Internal Medicine | Admitting: Speech Pathology

## 2024-06-22 DIAGNOSIS — R498 Other voice and resonance disorders: Secondary | ICD-10-CM | POA: Diagnosis present

## 2024-06-22 NOTE — Therapy (Signed)
 OUTPATIENT SPEECH LANGUAGE PATHOLOGY VOICE TREATMENT   Patient Name: [name] MRN: [id number] DOB:[birth year], 73 y.o., female Today's Date: 06/22/2024  PCP: Leonce Carola PARAS PA-C REFERRING PROVIDER: Geronimo Amel, MD  END OF SESSION:  End of Session - 06/22/24 0936     Visit Number 3    Number of Visits 6    Date for Recertification  06/29/24    SLP Start Time 0933    SLP Stop Time  1015    SLP Time Calculation (min) 42 min    Activity Tolerance Patient tolerated treatment well          Past Medical History:  Diagnosis Date   AICD (automatic cardioverter/defibrillator) present    [employer]   Allergy 09/20/1994   Anxiety    Arthritis    Back pain    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   Bronchiectasis (HCC)    Cerebral aneurysm, nonruptured    had a clip put in   CHF (congestive heart failure) (HCC)    Clostridium difficile infection    Depressive disorder, not elsewhere classified    Diverticulosis of colon (without mention of hemorrhage)    Esophageal reflux    Fatty liver    Fibromyalgia    Gastritis    GERD (gastroesophageal reflux disease)    GI bleed 2004   Glaucoma    Hiatal hernia    History of COVID-19 05/04/2021   Hyperlipidemia    Hypertension    Hypothyroidism    Internal hemorrhoids    Joint pain    Multifocal pneumonia 06/03/2021   Obstructive sleep apnea (adult) (pediatric)    Osteoarthritis    Ostium secundum type atrial septal defect    Other chronic nonalcoholic liver disease    Other pulmonary embolism and infarction    Palpitations    Paroxysmal ventricular tachycardia (HCC)    Personal history of radiation therapy    PONV (postoperative nausea and vomiting)    Presence of permanent cardiac pacemaker    PUD (peptic ulcer disease)    Radiation 02/03/15-03/10/15   Right Breast   Sarcoid    per pt , not sure   Schatzki's ring    Shortness of breath    with exertion - has oxygen prn  via [place] at 2 L   Sleep apnea    uses CPAP nightly   Stroke [facility]) 2013   tia/ pt feels it was around 2008 0r 2009   Takotsubo syndrome    Tubular adenoma of colon    Unspecified transient cerebral ischemia    Unspecified vitamin D  deficiency    Past Surgical History:  Procedure Laterality Date   ABDOMINAL HYSTERECTOMY  11/22/1998   ABI  2006   normal   BONE EXOSTOSIS EXCISION Right 04/02/2024   Procedure: EXCISION, EXOSTOSIS;  Surgeon: Kit Rush, MD;  Location: [facility] OR;  Service: Orthopedics;  Laterality: Right;   BREAST EXCISIONAL BIOPSY     BREAST LUMPECTOMY Right 2016   BREAST LUMPECTOMY WITH RADIOACTIVE SEED LOCALIZATION Right 01/03/2015   Procedure: BREAST LUMPECTOMY WITH RADIOACTIVE SEED LOCALIZATION;  Surgeon: Deward Null III, MD;  Location: [facility] OR;  Service: General;  Laterality: Right;   CARDIAC DEFIBRILLATOR PLACEMENT  2006; 2012   BSX single chamber ICD   CARDIAC DEFIBRILLATOR PLACEMENT  2025   carotid dopplers  2006   neg   CEREBRAL ANEURYSM REPAIR  02/1999   COLONOSCOPY     EYE SURGERY Right 02/2023  surgery center - for cataracts   HAMMER TOE SURGERY  11/15/2020   3rd digit bilateral feet    hospitalization  2004   GI bleed, PUD, diverticulosis (EGD,colonscopy)   hospitalization     PE, NSVT, s/p defib   JOINT REPLACEMENT  05/2021   KNEE ARTHROSCOPY     bilateral   LEFT HEART CATHETERIZATION WITH CORONARY ANGIOGRAM N/A 02/22/2012   Procedure: LEFT HEART CATHETERIZATION WITH CORONARY ANGIOGRAM;  Surgeon: Lonni [name] Cash, MD;  Location: [facility] CATH LAB;  Service: Cardiovascular;  Laterality: N/A;   PARTIAL HYSTERECTOMY     Fibroids   RIGHT HEART CATH N/A 06/18/2024   Procedure: RIGHT HEART CATH;  Surgeon: Rolan Ezra RAMAN, MD;  Location: [facility] INVASIVE CV LAB;  Service: Cardiovascular;  Laterality: N/A;   TONGUE BIOPSY  12/12/2017   due to sore tongue and white patches/abnormal cells   TOTAL KNEE ARTHROPLASTY Left 02/13/2016   Procedure: TOTAL KNEE ARTHROPLASTY;   Surgeon: Marcey Raman, MD;  Location: [facility] OR;  Service: Orthopedics;  Laterality: Left;   TOTAL KNEE ARTHROPLASTY Right 05/22/2021   Procedure: TOTAL KNEE ARTHROPLASTY;  Surgeon: Raman Marcey, MD;  Location: [facility] ORS;  Service: Orthopedics;  Laterality: Right;  with block   TUBAL LIGATION  09/20/1978   UPPER GASTROINTESTINAL ENDOSCOPY     Patient Active Problem List   Diagnosis Date Noted   Pulmonary embolism (HCC) 04/03/2023   Acute pulmonary embolism (HCC) 04/02/2023   COVID-19 virus infection 03/12/2023   IPF (idiopathic pulmonary fibrosis) (HCC) 02/25/2023   Bicipital tendinitis of left shoulder 11/09/2022   Partial symptomatic epilepsy with complex partial seizures, not intractable, without status epilepticus (HCC) 08/10/2022   Low back pain 08/10/2022   Gait abnormality 05/04/2022   Muscle spasm 05/04/2022   Upper airway cough syndrome 01/19/2022   Chronic left shoulder pain 11/13/2021   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   Localized swelling of right lower leg 08/29/2021   Tonsillar exudate 08/29/2021   Candida vaginitis 08/28/2021   Chronic pain syndrome 08/25/2021   Multifocal pneumonia 06/03/2021   Pre-operative respiratory examination 05/18/2021   Statin myopathy 04/19/2021   Pain in joint of right shoulder 02/10/2021   Arthritis of both acromioclavicular joints 01/02/2021   Trochanteric bursitis of right hip 10/03/2020   Tailbone injury 05/05/2020   UTI (urinary tract infection) 05/05/2020   Insomnia 05/05/2020   Leg cramps 02/16/2020   Dysuria 02/16/2020   Right hip impingement syndrome 02/01/2020   Right bicipital tenosynovitis 02/01/2020   Bronchiectasis without complication (HCC) 01/22/2020   Myofascial pain 12/21/2019   Estrogen deficiency 12/10/2019   Obesity (BMI 30-39.9) 12/10/2019   Bronchiectasis with (acute) exacerbation (HCC) 10/23/2019   Elevated LFTs 10/15/2019   Lower abdominal pain 09/01/2019   Neck pain 10/25/2017   Paresthesias in left hand  10/25/2017   Paresthesia of foot, bilateral 04/12/2017   Primary osteoarthritis of both hands 09/27/2016   Primary osteoarthritis of both knees 09/27/2016   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   Nasopharyngitis, 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/2016   S/P total knee replacement 02/13/2016   Chronic pain of both knees 12/06/2015   Globus pharyngeus 05/17/2015   Genetic testing 12/23/2014   Family history of breast cancer    Family history of colon cancer    Family history of pancreatic cancer    History of breast cancer 11/23/2014   Allergy to ACE inhibitors 09/27/2014   Prediabetes 06/23/2013   Muscle spasms of both lower extremities 01/15/2013   Hx of Clostridium difficile infection 10/10/2012   Dyspnea  on exertion 04/02/2012   NICM (nonischemic cardiomyopathy) (HCC) 03/07/2012   Post-menopausal 01/11/2012   Atypical chest pain 11/07/2011   Obstructive sleep apnea 04/04/2010   V-tach (HCC) 01/03/2009   ICD  [employer]  Single chamber 01/03/2009   PFO (patent foramen ovale) 09/29/2008   Vitamin D  deficiency 08/17/2008   Hypothyroidism 11/18/2006   Hyperlipidemia 11/18/2006   Depression with anxiety 11/18/2006   Essential hypertension 11/18/2006   Mitral valve prolapse 11/18/2006   Cerebral aneurysm 11/18/2006   Allergic rhinitis 11/18/2006   GERD 11/18/2006   Diverticulosis of colon 11/18/2006   Fatty liver 11/18/2006   Fibromyalgia 11/18/2006    Onset date: 02/18/2024  REFERRING DIAG: G15.887 (ICD-10-CM) - IPF (idiopathic pulmonary fibrosis) (HCC) Z51.81 (ICD-10-CM) - Medication monitoring encounter G47.33 (ICD-10-CM) - OSA (obstructive sleep apnea) R05.3 (ICD-10-CM) - Chronic cough Z91.09 (ICD-10-CM) - House dust mite allergy R09.89 (ICD-10-CM) - Chronic throat clearing Z86.711 (ICD-10-CM) - History of pulmonary embolism  THERAPY DIAG:  Other voice and resonance disorders  Rationale for Evaluation and Treatment: Rehabilitation  SUBJECTIVE:   SUBJECTIVE STATEMENT: The  night before last I had a coughing bout really bad - I didn't have my C PAP on Pt accompanied by: self  PERTINENT HISTORY: Dickey Roseann [place] 73 y.o. -returns for follow-up.  She says she is feeling stable but she is mostly bothered by chronic cough with clearing of the throat for the last 2 months.  She states that when she is out of the house the cough is less.  She does have dust mite allergy and she says she is taking all the precautions but nevertheless when she is out of the house the cough is less.  She is also clearing the throat a lot.  She is asking for interventions.  But from a shortness of breath perspective she is stable.  She is tolerating Esbriet  well.  She had pulmonary function testing that shows a continued steady decline in DLCO  PAIN:  Are you having pain? Yes: NPRS scale: 8/10 Pain location: back Pain description: ache, pain Aggravating factors: laying on stomach Relieving factors: meds  FALLS: Has patient fallen in last 6 months? No, Number of falls: 0  LIVING ENVIRONMENT: Lives with: lives alone Lives in: House/apartment  PLOF:Level of assistance: Independent with ADLs, Independent with IADLs Employment: Retired  PATIENT GOALS:   OBJECTIVE:  Note: Objective measures were completed at Evaluation unless otherwise noted.   COGNITION: Overall cognitive status: Within functional limits for tasks assessed   SOCIAL HISTORY: Occupation: Retired Water  intake: optimal Caffeine/alcohol  intake: minimal Daily voice use: minimal  PERCEPTUAL VOICE ASSESSMENT: Voice quality: hoarse and low vocal intensity Vocal abuse: habitual throat clearing - 11x this session Resonance: normal Respiratory function: thoracic breathing  OBJECTIVE VOICE ASSESSMENT: Maximum phonation time for sustained ah: 9.46 Conversational loudness average: 66 dB Conversational loudness range: 62-68 dB (70dB is WNL) S/z ratio: 1.05 (Suggestive of dysfunction >1.4)  PATIENT REPORTED OUTCOME  MEASURES (PROM): V-RQOL: 22 - she rated a 3 or moderate problem: speaking loudly and being heard, feeling frustrated due to her voice, having to repeat her self to be understood and rated a 2 for not knowing what will come out when she starts speaking.  TREATMENT DATE:   06/22/24: Ahria has been out of town for 4 weeks - She enters with hoarse voice and low volume. She has carried over alginate at night and feels it is helping. She has carried over cough suppression techniques successfully - rescues breathing and swallow - she reports cough is 50% reduced at least (subjectively). She has not completed her voice exercises since her last visit. Voice is hoarse - HEP for voice (PhoRTE) - with occasional min A to correct hoarse voice - sustained Ha with clear voice 10/10x; pitch glides with clear voice 8/10x; 10 sentences in high and low pitch with intermittent hoarseness at high pitch. Initiated training in SOVTE (semi-occluded vocal tract exercises) in water  with rare min A after initial modeling and instruction. Katharin sees pulmonologist this week. She initially denied difficulty swallowing but now endorses swallowing to be a trigger for cough - will discuss this next visit, trigger for cough vs dysphagia  04/13/24: Ronniesha enters with low volume, (65dB) hoarse voice. She reports coughing episode night before last as she did not have C PAP on. Coughing during the day reduced - she continues on pregabalin  1x a day. Areen reports she has been reducing throat clearing and improving her awareness of throat clearing behavior. She is successfully swallowing instead of clearing her throat. Today, 1 throat clear with verbal cues to ID the throat clear. Targeted HEP for voice as clear voice achieve with high intensity vocal exercises - sustained Ha with 80-85dB for 5 seconds resulted in clear phonation  10/10 trial and occasional modeling and verbal cues. Pitch glides up and down, again starting using ha with clear phonation and usual min modeling, gestures and verbal cues for volume and to sustain high and low pitches. Reading in high and low pitch with increased vocal intensity with occasional modeling and verbal cues. Targeted increased vocal intensity generating 3 sentence descriptions with consistent modeling reduced hoarse voice to 40% of words uttered. Madalynne feels too loud when she achieves WNL volume. She required consistent verbal cues and imitating to ID when she speaks on residual volume and in low glottal fry.   04/06/24: Eval completed - initiated training in throat clear awareness and throat clear avoidance. Initially she required consistent mod A to ID throat clearing behavior, which improved to occasional min verbal and visual cues (tally Lyvers). After initial training, Preslynn demonstrated throat clear suppression with hard swallow, sniffs and/or sip 3/5x with usual mod A. Initiated training in cough suppression using gentle, relaxed sniff-sniff blow pattern and intermittent swallows or sips. She did not have coughing episode this session, however she demonstrated these strategies with rare min A in isolation as practice. Education re: irritable larynx, LPR and post nasal drip provided   PATIENT EDUCATION: Education details: See Treatment, See patient instructions, reflux education, strategies to suppress cough and throat clear, vocal hygiene Person educated: Patient Education method: Explanation, Demonstration, Verbal cues, and Handouts Education comprehension: verbalized understanding, verbal cues required, and needs further education  HOME EXERCISE PROGRAM: TBD - this week focus on identifying and eliminating throat clearing behavior  GOALS: Goals reviewed with patient? Yes   LONG TERM GOALS: Target date: 06/29/24 - expecting 4-6 visits - extended date due to scheduling  Pt  will clear her throat 2 or less times over 2 sessions Baseline:  Goal status: ONGOING  2.  Pt will complete HEP for voice with mod I Baseline:  Goal status: ONGOING  3.  Pt will use strategies to suppress or prevent  cough with occasional min A over 2 sessions Baseline:  Goal status: ONGOING  4.  Pt will report 50% reduction in coughing subjectively Baseline:  Goal status: ONGOING  5.  Pt will achieve clear phonation and WNL volume 18/20 sentences Baseline:  Goal status: ONGOING  6.  Pt will maintain WNL volume and clear phonation over 8 minute conversation with rare min A Baseline:  Goal status: ONGOING  ASSESSMENT:  CLINICAL IMPRESSION: Patient is a 73 y.o. female who was seen today for chronic cough and hoarse voice. She reports voice has been hoarse for about 2 months. She is referred by pulmonologist for chronic cough and irritable larynx. She identifies perfume, scents, laughing, post nasal drip and smoke as triggers for her cough. She reports the cough in uncontrollable at times and garners unwanted attention. Others ask if she is sick or needs water . She has stopped reading at church due to voice changes and is unable to sing due to voice changes. She is on PPI in am and H2 blocker pm for GERD as well as  flonase  for nasal drip and allergies. She consistently clears her throat reporting globus sensation. She reports she has to repeat herself frequently due to voice changes. Her conversation volume is low (66dB) as well. Trials of high intensity voice exercises did result in improved voice quality indicating she is a good candidate for speech therapy. I recommend skilled ST to reduce sx of irritable larynx and improve intelligibility for safety, QOL.  OBJECTIVE IMPAIRMENTS: include voice disorder. These impairments are limiting patient from effectively communicating at home and in community. Factors affecting potential to achieve goals and functional outcome are n/a.SABRA Patient will  benefit from skilled SLP services to address above impairments and improve overall function.  REHAB POTENTIAL: Good  PLAN:  SLP FREQUENCY: 1-2x/week  SLP DURATION: 12 weeks  PLANNED INTERVENTIONS: Diet toleration management , Environmental controls, Internal/external aids, Functional tasks, Multimodal communication approach, SLP instruction and feedback, Compensatory strategies, Patient/family education, [phone] Treatment of speech (30 or 45 min) , and [id number]- Speech Eval Behavioral Qualitative Voice Resonance    Mathis Leita Caldron, CCC-SLP 06/22/2024, 10:18 AM

## 2024-06-22 NOTE — Patient Instructions (Addendum)
 Great job eliminating throat clears and using your sniff-blow and swallows to suppress the cough!!  Hum 10 seconds 10x  10 glides up and down  Siren - 4 reps 10x  [employer] - as long as you can until it gets raspy - goal is 10 seconds, but don't strain  10 glides with Ha up and down  10 sentences in a high voice like you are calling over the fence to a neighbor  10 sentences in a deep voice like you are the boss

## 2024-06-23 ENCOUNTER — Other Ambulatory Visit (HOSPITAL_COMMUNITY): Payer: Self-pay

## 2024-06-24 ENCOUNTER — Encounter: Payer: Self-pay | Admitting: Internal Medicine

## 2024-06-24 ENCOUNTER — Ambulatory Visit (INDEPENDENT_AMBULATORY_CARE_PROVIDER_SITE_OTHER): Admitting: Internal Medicine

## 2024-06-24 VITALS — BP 124/82 | HR 86 | Ht 66.5 in | Wt 208.0 lb

## 2024-06-24 DIAGNOSIS — R053 Chronic cough: Secondary | ICD-10-CM | POA: Diagnosis not present

## 2024-06-24 DIAGNOSIS — I2723 Pulmonary hypertension due to lung diseases and hypoxia: Secondary | ICD-10-CM | POA: Diagnosis not present

## 2024-06-24 DIAGNOSIS — Z86711 Personal history of pulmonary embolism: Secondary | ICD-10-CM

## 2024-06-24 DIAGNOSIS — G4733 Obstructive sleep apnea (adult) (pediatric): Secondary | ICD-10-CM

## 2024-06-24 DIAGNOSIS — Z5181 Encounter for therapeutic drug level monitoring: Secondary | ICD-10-CM

## 2024-06-24 DIAGNOSIS — J84112 Idiopathic pulmonary fibrosis: Secondary | ICD-10-CM | POA: Diagnosis not present

## 2024-06-24 NOTE — Patient Instructions (Addendum)
 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  Plan  Start tyvaso standard of care DPI Then next step consider Phocus Pulmovant study    IPF (idiopathic pulmonary fibrosis) (HCC) Medication monitoring encounter History of abnormal liver function  -Currently tolerating Esbriet  well without any side effects although liver function test was elevated in January 2025 and sept 2025 chronically    Plan -Check liver function test today 06/24/2024 - continue esbriet  - do spirometry and dlco in 2 months  History of 2019 novel coronavirus disease (COVID-19) History of pulmonary embolism April 02, 2023 Throat burning with Pradaxa   - Doing well on Eliquis    Plan -Continue Eliquis  full doe   Chronic cough 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 MALVA   Followup  -- 2 months to  to see Dr. Geronimo in a 30-minute visit after spiro/dlco  -Symptom score and exercise hypoxemia test at follow-up

## 2024-06-24 NOTE — Progress Notes (Signed)
 OV 08/23/2022 -refer by Dr. Shellia to Dr. Geronimo at the pulmonary fibrosis center.  Subjective:  Patient ID: [name], female , DOB: [birth year] , age 73 y.o. , MRN: [id number] , ADDRESS: [address] [place] [address] PCP Tanda Bleacher, MD Patient Care Team: Tanda Bleacher, MD as PCP - General (Family Medicine) Fernande Elspeth BROCKS, MD as PCP - Electrophysiology (Cardiology) Dann Candyce RAMAN, MD as PCP - Cardiology (Cardiology) Curvin Deward MOULD, MD as Consulting Physician (General Surgery) Lanny Callander, MD as Consulting Physician (Hematology) Keenan Hastings, MD as Consulting Physician (Radiation Oncology) Tyree Nanetta SAILOR, RN as Registered Nurse Glean Stephane BROCKS, RN as Registered Nurse Fernande Elspeth BROCKS, MD as Consulting Physician (Cardiology) Letha Truman ORN, NP (Inactive) as Nurse Practitioner (Nurse Practitioner) Moses Powell Hummer, NP as Nurse Practitioner (Nurse Practitioner) Shellia Oh, MD as Consulting Physician (Pulmonary Disease) Curvin Deward MOULD, MD as Consulting Physician (General Surgery) Dolphus Reiter, MD as Consulting Physician (Rheumatology)  This Provider for this visit: Treatment Team:  Attending Provider: Geronimo Amel, MD    08/23/2022 -   Chief Complaint  Patient presents with   Consult    Referred for ILD, states DOE     HPI [facility] 73 y.o. -referred by Dr. Shellia to Dr. Geronimo the fibrosis center.  History is gained by review of the chart, talking to the patient in the ILD questionnaire that the patient fell.  There is no one accompanying the patient.  She has been following with Dr. Shellia at least for the last 3 to 4 years for a diagnosis of bronchiectasis.  She says at baseline she is doing fairly well but then in October 2022 she suffered from COVID-19 and then was hospitalized.  Review of the records indicate that she was hospitalized for total knee replacement of the right knee early August 2022 for 1  day.  Then a few weeks later on 06/03/2021 she was hospitalized for worsening respiratory failure which she believes was because of COVID-19 but review the records indicate that she actually had COVID in September 2022 and was treated with Paxlovid  as an outpatient.  At this hospitalization the COVID was negative.  She was actually hypoxemic.  Pulmonary embolism was ruled out.  This admission RVP and COVID were normal.  Procalcitonin was normal.  Final diagnosis was acute on chronic hypoxemic respiratory failure due to bronchiectasis and pulmonary fibrosis.  She says since then she has been gradually weaned of oxygen.  She says overall she is better since the hospitalization October 2022 but she is not back to her baseline from before the hospitalization.  She has dyspnea on exertion.  Most recently November 2023 Dr. Shellia did high-resolution CT scan of the chest that is concerning for honeycombing findings and progressive changes [personally visualized and I do not fully agree].  Therefore she has been referred here.  Pulmonary function testing shows still blood in DLCO but the FVC seems reduced recently.   All the question is as follows  Most recently she is use 2 L of oxygen with exertion.   [facility] Comprehensive ILD Questionnaire  Symptoms:  Other than dyspnea she also has a cough.  The cough is a since December 2023.  Although currently it is not there.   Past Medical History :  -She marked positive for COPD according to history but does not not be correct. -She is have OSA for which she is previous Dr. Shellia - She does  have a longstanding history of acid reflux/hiatal hernia No history of tuberculosis -She has a history of breast cancer with radiation to the right breast in 2018 -Per the records she suffers from chronic pain syndrome with a history of right brain aneurysm clipping 2001 and cervical stenosis. -She has acid reflux disease for which she sees Dr. Albertus -She has  overactive bladder -She has nonischemic cardiomyopathy for which she sees Dr. Fernande  -EF 35-40% in 2013 but as high as 50-55% in December 2022   ROS:  She has longstanding chronic pain and gait abnormality  FAMILY HISTORY of LUNG DISEASE:  She has arthralgia - She does have dry eyes -She does have heartburn -Denies family history of lung disease  PERSONAL EXPOSURE HISTORY:  -Denies family history of lung disease -She smoked between 1966 in 67 1/2 pack/day.  No marijuana no cocaine no vaping  HOME  EXPOSURE and HOBBY DETAILS :  -Her current home was built in 1973 and she has been in this house since the last 25 years.  She does use a CPAP mask.  She does use a nebulizer machine.  She does do some occasional gardening but otherwise detailed organic antigen history is negative.  OCCUPATIONAL HISTORY (122 questions) : She is to work for [employer] and then got disabled and then retired.  Detail organic and inorganic antigen history exposure at work is negative.  PULMONARY TOXICITY HISTORY (27 items):  -Did get rid Dacian to the right breast in 2018  INVESTIGATIONS: -     CT Chest data - HRCT Nov 2023 -personally visualized.  Not fully sure if that is actually UIP or not.  If that is UIP then that negative prognostic marker.  I am also not sure about progression.  With this allowed to be clarified with multidisciplinary case conference.   Narrative & Impression  CLINICAL DATA:  73 year old female with history of shortness of breath. Evaluate for interstitial lung disease.   EXAM: CT CHEST WITHOUT CONTRAST   TECHNIQUE: Multidetector CT imaging of the chest was performed following the standard protocol without intravenous contrast. High resolution imaging of the lungs, as well as inspiratory and expiratory imaging, was performed.   RADIATION DOSE REDUCTION: This exam was performed according to the departmental dose-optimization program which includes automated exposure  control, adjustment of the mA and/or kV according to patient size and/or use of iterative reconstruction technique.   COMPARISON:  CTA of the chest 08/30/2021.   FINDINGS: Cardiovascular: Heart size is normal. There is no significant pericardial fluid, thickening or pericardial calcification. There is aortic atherosclerosis, as well as atherosclerosis of the great vessels of the mediastinum and the coronary arteries, including calcified atherosclerotic plaque in the left anterior descending coronary artery. Left-sided pacemaker/AICD device in place with lead tip terminating in the right ventricular apex.   Mediastinum/Nodes: No pathologically enlarged mediastinal or hilar lymph nodes. Please note that accurate exclusion of hilar adenopathy is limited on noncontrast CT scans. Esophagus is unremarkable in appearance. No axillary lymphadenopathy.   Lungs/Pleura: High-resolution images demonstrate widespread but patchy areas of ground-glass attenuation, septal thickening, subpleural reticulation, parenchymal banding, traction bronchiectasis, peripheral bronchiolectasis and extensive honeycombing. These findings have a definitive craniocaudal gradient and are progressive compared to the prior examination from 08/30/2021. Inspiratory and expiratory imaging is unremarkable. No acute confluent consolidative airspace disease. No pleural effusions. No definite suspicious appearing pulmonary nodules or masses are noted.   Upper Abdomen: Aortic atherosclerosis. Colonic diverticulosis noted in the splenic flexure of the colon.  Musculoskeletal: There are no aggressive appearing lytic or blastic lesions noted in the visualized portions of the skeleton.   IMPRESSION: 1. 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 2. Aortic atherosclerosis, in addition to left  anterior descending coronary artery disease. Assessment for potential risk factor modification, dietary therapy or pharmacologic therapy may be warranted, if clinically indicated. 3. Colonic diverticulosis without evidence of acute diverticulitis at this time.   Aortic Atherosclerosis (ICD10-I70.0).     Electronically Signed   By: Toribio Aye M.D.   On: 07/08/2022 10:54     OV 11/22/2022  Subjective:  Patient ID: [name], female , DOB: [birth year] , age 71 y.o. , MRN: [id number] , ADDRESS: [address] [place] [address] PCP Tanda Bleacher, MD Patient Care Team: Tanda Bleacher, MD as PCP - General (Family Medicine) Fernande Elspeth BROCKS, MD as PCP - Electrophysiology (Cardiology) Dann Candyce RAMAN, MD as PCP - Cardiology (Cardiology) Curvin Deward MOULD, MD as Consulting Physician (General Surgery) Lanny Callander, MD as Consulting Physician (Hematology) Keenan Hastings, MD as Consulting Physician (Radiation Oncology) Tyree Nanetta SAILOR, RN as Registered Nurse Glean Stephane BROCKS, RN as Registered Nurse Fernande Elspeth BROCKS, MD as Consulting Physician (Cardiology) Letha Truman ORN, NP (Inactive) as Nurse Practitioner (Nurse Practitioner) Moses Powell Hummer, NP as Nurse Practitioner (Nurse Practitioner) Shellia Oh, MD as Consulting Physician (Pulmonary Disease) Curvin Deward MOULD, MD as Consulting Physician (General Surgery) Dolphus Reiter, MD as Consulting Physician (Rheumatology)  This Provider for this visit: Treatment Team:  Attending Provider: Geronimo Amel, MD    11/22/2022 -   Chief Complaint  Patient presents with   Follow-up    F/u pft 11/15/22. Labs,      HPI [name] 73 y.o. -returns for follow-up.  This is potential ILD workup in progress.  She states in the interim her son who is age 70 did have a heart transplant for congestive heart failure 3 weeks ago at [facility] he is home and is improving.  She is happy about that.  She  also reports that after her COVID she was prescribed Symbicort  she is run out of the Symbicort  for the last 2 weeks but this is not really bothering her.  She feels stable she has occasional cough.  Be here to review the results.  She had pulmonary function test that shows now a consistent pattern of decline her FVC is declined 9% in the last 4 years.  I went back and visualized all her CT scans of the chest.  Her CT scan in 2013 and a high-resolution CT chest in 2020 look about similar with some bronchiectasis.  However there is dramatic change between 2020 and also the high-resolution CT chest and late 2023.  This seems a lot more traction bronchiectasis.  There is definite craniocaudal gradient.  She has crackles here.  Initially I was not sure if this was classic UIP but clinically this makes sense especially of the lower lobe crackles.  Was supposed to discuss this in the case conference but this did not happen.  Every posted for discussion of the case conference but nevertheless his decline in PFTs and also this change now in the CT scan of the chest.  In the interim his serologies are normal.  I think at the least she has some plan for progressive ILD .  Possible is IPF but I would like to confirm this and discussion  of the case conference.  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  Went over the 2 antifibrotic's nintedanib and pirfenidone .  Shows pirfenidone  because of the diarrhea side effects with nintedanib.  She has knee pain and be hard to get to the toilet.  In addition she has nonischemic cardiomyopathy and this blackbox warning for MI risk with with nintedanib.  [Even though she does not have ischemic cardiomyopathy].  Told her that pirfenidone  associated more with weight loss nausea anorexia.  Both drugs required liver function monitoring.  Generally both drugs have reversible side effects.  In balance which was  pirfenidone .  I did note mild transaminitis in the past will check liver function test again today.   [id number]      OV 01/03/2023  Subjective:  Patient ID: [name], female , DOB: [birth year] , age 73 y.o. , MRN: [id number] , ADDRESS: [address] [place] [address] PCP Tanda Bleacher, MD Patient Care Team: Tanda Bleacher, MD as PCP - General (Family Medicine) Fernande Elspeth BROCKS, MD as PCP - Electrophysiology (Cardiology) Dann Candyce RAMAN, MD as PCP - Cardiology (Cardiology) Curvin Deward MOULD, MD as Consulting Physician (General Surgery) Lanny Callander, MD as Consulting Physician (Hematology) Keenan Hastings, MD as Consulting Physician (Radiation Oncology) Tyree Nanetta SAILOR, RN as Registered Nurse Glean Stephane BROCKS, RN as Registered Nurse Fernande Elspeth BROCKS, MD as Consulting Physician (Cardiology) Letha Truman ORN, NP (Inactive) as Nurse Practitioner (Nurse Practitioner) Moses Powell Hummer, NP as Nurse Practitioner (Nurse Practitioner) Shellia Oh, MD as Consulting Physician (Pulmonary Disease) Curvin Deward MOULD, MD as Consulting Physician (General Surgery) Dolphus Reiter, MD as Consulting Physician (Rheumatology)  This Provider for this visit: Treatment Team:  Attending Provider: Geronimo Amel, MD    01/03/2023 -   Chief Complaint  Patient presents with   Follow-up    F/up wheezing, congestion, cough, sob with exertion.     HPI [name] 73 y.o. -returns for follow-up.  At this visit she was supposed to have started pirfenidone  but what happened is that she had abnormal liver enzymes at the time of last visit.  We found that she was taking a lot of Tylenol  for the knee pain.  She is now saying she did not take a lot but she actually did.  Her liver enzymes have since normalized as of December 07, 2022.  She is concerned about a high total protein.  She wanted to discuss this.  I did recommend to her that she  discuss this with primary care physician.  Then she brought her back pain.  I also suggested she discuss with primary care physician.  She wants repeat liver enzymes check especially because of total protein which she believes is because of the Repatha .  Meanwhile she continues to be symptomatic.  We discussed in the case conference in the results are below.  I did share these results with the patient.     MDD conference Ronal 2024: Definitely progressive 2013 very mild non specific ILD. 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 NOv 2023: most rcent versus Feb 2020 HRCT. In msost recent Ct  basilar predmon, ILD, Subpleural reticuation with prominent TB. There is clear honeycombing (moderate) in lung base. No air trapping. Progressed sinc2020.  C/w UIP. In 2020: read as alternate diagnosis but Dr Leonce thinks in retrospect is mild to moderate  probable or c/w UIP with mild honeycombing. The bronchiectasis all traction bronchiectasis  of fibrosis does not need bipsy. Call it IPF. Rx as IPF   We then discussed the antifibrotic's again.  We went over nintedanib or pirfenidone  again.  We stuck with the original decision on taking pirfenidone  and she is committing to it.  I will send a note to pharmacy.  Will check liver function test today.  03/12/2023 Acute OV : Covid 19 infection  Presents for an acute office visit.  She complains of 1 week of cough, congestion, increased shortness of breath.  She tested positive for COVID-19 on March 08, 2023.  She was treated with a Z-Pak.  She has completed this in full.  Continues to have some ongoing mucus that is thick.  Denies any fever, hemoptysis, chest pain.  She remains on Symbicort  twice daily. She is feeling some better. Concerned that her mucus is still thick. Has nasal congestion and drainage. Still feels weak. Appetite is okay, no n/v/d.   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 She was started on Esbriet  but unable to start due to elevated liver enzymes . Was  seen by GI diagnosed with fatty liver. Has follow up next month with Dr. Geronimo for ILD.   Has sleep apnea on CPAP At bedtime . Does not wear oxygen at home.        OV 03/29/2023  Subjective:  Patient ID: [name], female , DOB: [birth year] , age 59 y.o. , MRN: [id number] , ADDRESS: [address] [place] [address] PCP Lorren Greig PARAS, NP Patient Care Team: Lorren Greig PARAS, NP as PCP - General (Nurse Practitioner) Fernande Elspeth BROCKS, MD as PCP - Electrophysiology (Cardiology) Dann Candyce RAMAN, MD as PCP - Cardiology (Cardiology) Curvin Deward MOULD, MD as Consulting Physician (General Surgery) Lanny Callander, MD as Consulting Physician (Hematology) Keenan Hastings, MD as Consulting Physician (Radiation Oncology) Tyree Nanetta SAILOR, RN as Registered Nurse Glean Stephane BROCKS, RN as Registered Nurse Fernande Elspeth BROCKS, MD as Consulting Physician (Cardiology) Letha Truman ORN, NP (Inactive) as Nurse Practitioner (Nurse Practitioner) Moses Powell Hummer, NP as Nurse Practitioner (Nurse Practitioner) Shellia Oh, MD as Consulting Physician (Pulmonary Disease) Curvin Deward MOULD, MD as Consulting Physician (General Surgery) Dolphus Reiter, MD as Consulting Physician (Rheumatology)  This Provider for this visit: Treatment Team:  Attending Provider: Geronimo Amel, MD    03/29/2023 -   Chief Complaint  Patient presents with   Acute Visit    Cough but not productive, runny nose.     HPI [name] 73 y.o. -acute visit for this IPF patient who has not been able to start antifibrotic's because of elevated liver enzymes.  Since her last visit she did see Dr. Sheran Phoenix who could not find an identifiable etiology for the elevated liver enzymes advised a low carbohydrate diet which she has been following.  She comes in because she is acutely ill.  She tells me that she did see Madelin Passy March 12, 2023 leory reviewed] had acute COVID-19..  Now she is calling  acutely for cough.  She says cough is getting worse for the last 2 days.  This headaches are some back pressure there is also shortness of breath for 2 days she feels some chills but no fever no sputum production.  Therefore she is here.  She does use oxygen at night.  In the past have tried to do excess hypoxemia test but unable to because of her knee issue.  Today she was able to do and she did desaturate to 81%  on a sit/stand test after doing it 10 times.  But I do not have a baseline to compare to.   TEST/EVENTS :  PFT 04/17/12 >> FEV1 2.60 (110%), FEV1% 85, TLC 4.05 (77%), DLCO 44% PFT 01/17/17 >> FEV1 2.38 (111%), FEV1% 90, TLC 3.85 (71%), DLCO 58%, no BD RAST 05/30/17 >> dust mites PFT 10/16/18 >> FEV1 2.27 (111%), FEV1% 90, TLC 4.00 (74%), DLCO 59% PFT 06/08/22 >> FEV1 2.20 (91%), FEV1% 93, TLC 3.76 (69%), DLCO 62%   Serology:  04/02/12 >> ACE 1 10/25/15 >> ESR 40, ACE 32 05/30/17 >> IgA, IgG, IgM normal 10/16/18 >> HP panel negative, ACE 25   Chest Imaging:  CT chest 04/10/12 >> b/l lower lobe cylindrical BTX and some in upper lobes CT angio chest 10/26/15 >> patchy GGO in periphery of lungs b/l, basilar BTX, no PE; no significant change compared to 2013 HRCT chest 10/08/18 >> atherosclerosis, basilar predominant cylindrical BTX, mild centrilobular/paraseptal emphysema, air trapping CT angio chest 03/24/19 >> Chronic lung changes with peribronchial thickening, bibasilar atelectasis and chronic interstitial disease/peripheral fibrosis at the lung bases CT angio chest 06/03/21 >> scattered GGO with fibrotic changes CT angio chest 08/30/21 >> BTX with emphysema and fibrotic changes in periphery HRCT 06/2022 -Progressive ILD-dx of UIP    Sleep Tests:  PSG 11/29/08 >> AHI 9 HST 11/10/15 >> AHI 7.1, SaO2 low 75% CPAP 06/17/21 to 07/16/21 >> used on 27 of 30 nights with average 5 hrs 24 min.  Average AHI 2.5 with CPAP 5 cm H2O.   Cardiac Tests:  Echo 08/11/21 >> EF 50 to 55%, RVSP 39.5  mmHg  PFT  OV 04/11/2023  Subjective:  Patient ID: [name], female , DOB: [birth year] , age 23 y.o. , MRN: [id number] , ADDRESS: [address] [place] [address] PCP Lorren Greig PARAS, NP Patient Care Team: Lorren Greig PARAS, NP as PCP - General (Nurse Practitioner) Fernande Elspeth BROCKS, MD as PCP - Electrophysiology (Cardiology) Dann Candyce RAMAN, MD as PCP - Cardiology (Cardiology) Curvin Deward MOULD, MD as Consulting Physician (General Surgery) Lanny Callander, MD as Consulting Physician (Hematology) Keenan Hastings, MD as Consulting Physician (Radiation Oncology) Tyree Nanetta SAILOR, RN as Registered Nurse Glean Stephane BROCKS, RN as Registered Nurse Fernande Elspeth BROCKS, MD as Consulting Physician (Cardiology) Letha Truman ORN, NP (Inactive) as Nurse Practitioner (Nurse Practitioner) Moses Powell Hummer, NP as Nurse Practitioner (Nurse Practitioner) Shellia Oh, MD as Consulting Physician (Pulmonary Disease) Curvin Deward MOULD, MD as Consulting Physician (General Surgery) Dolphus Reiter, MD as Consulting Physician (Rheumatology)  This Provider for this visit: Treatment Team:  Attending Provider: Geronimo Amel, MD    04/11/2023 -   Chief Complaint  Patient presents with   Follow-up    F/up on hosp visit, IPF and blood work   HPI [name] 73 y.o. -returns for follow-up.  She was seen acutely in July 2020 for outpatient COVID then in August 2024 she came in acutely what look like bronchitis ended up turning out to be pulmonary embolism.  She did get admitted.  She has been discharged on Pradaxa  because Eliquis  was expensive.  She is using oxygen 2 L nasal cannula she feels better but he does get tachycardic.  She is worried about the PE.  She is worried about the duration of Pradaxa  and the cost associated with it and previous bleeding risk she says she had a bleeding episode 20 years ago.  I did indicate to her that potentially lifelong treatment because a  second  episode versus at least taking for a year and then reassessing.  In terms of IPF LFTs most recently mid August 2024 are normal.  She is agreed to start pirfenidone .  We did a sit/stand hypoxemia test and she did desaturate again.  I did indicate to her this might not be the PE make her desaturate but the IPF.  She asked about transplant I did indicate to her the 63 is at the upper limit plus she has got obesity.  Did indicate to her that we need to make progress with weight loss and then subsequently we went with the basics of IPF management and then we can decide.  She is using nighttime oxygen on her own.  But is never been tested.  Sleep study was ordered before she saw Dr. MALVA but because of illness this was canceled.  We will reorder this.  OV 05/23/2023  Subjective:  Patient ID: [name], female , DOB: 1951-02-05 , age 83 y.o. , MRN: [id number] , ADDRESS: [address] [place] [address] PCP Lorren Greig PARAS, NP Patient Care Team: Lorren Greig PARAS, NP as PCP - General (Nurse Practitioner) Fernande Elspeth BROCKS, MD as PCP - Electrophysiology (Cardiology) Dann Candyce RAMAN, MD as PCP - Cardiology (Cardiology) Curvin Deward MOULD, MD as Consulting Physician (General Surgery) Lanny Callander, MD as Consulting Physician (Hematology) Keenan Hastings, MD as Consulting Physician (Radiation Oncology) Tyree Nanetta SAILOR, RN as Registered Nurse Glean Stephane BROCKS, RN as Registered Nurse Fernande Elspeth BROCKS, MD as Consulting Physician (Cardiology) Letha Truman ORN, NP (Inactive) as Nurse Practitioner (Nurse Practitioner) Moses Powell Hummer, NP as Nurse Practitioner (Nurse Practitioner) Shellia Oh, MD (Inactive) as Consulting Physician (Pulmonary Disease) Curvin Deward MOULD, MD as Consulting Physician (General Surgery) Dolphus Reiter, MD as Consulting Physician (Rheumatology)  This Provider for this visit: Treatment Team:  Attending Provider: Geronimo Amel, MD    05/23/2023 -   Chief  Complaint  Patient presents with   Followup     She has occ dizzy spells- occurs while at rest. She feels like her breathing has improved since the last visit. She notices rattling in chest at night. Her cough is mainly non prod.      HPI [name] 73 y.o. 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  She finally got a pirfenidone  mid September 2024 based on chart review.  Today she goes up to 3 pills 3 times daily.  She initially said she was tolerating pirfenidone  well but then she admitted that ever since she started the pirfenidone  she is getting dizzy spells.  These are very transient split-second.  They happen spontaneously.  2-3 times a day.  Could be standing or sitting or lying down.  She does not lose any consciousness.  No focal deficit no seizures no falls no double vision no fever no chills but definitely new definitely mild and definitely since starting pirfenidone .  She is not compliant with her oxygen use.  However her exercise hypoxemia test is better in fact her overall shortness of breath with exertion is better. Want to rehab  # Pulmonary embolism August 2024 she continues Pradaxa .  She tells me now for the first time that ever since starting Pradaxa  she is having intense throat burning despite her being on PPI and H2 blockade.  At this straight after taking Pradaxa  does not think that with pirfenidone .  Review of Pradaxa  side effects is that greater than 25% GI side effects.  She could not afford Eliquis  but she is willing to see if this is now affordable.  Meanwhile we will give her a GI cocktail.  I have sent a message to the pharmacy team  #Sleep apnea she is under the care of Dr. MALVA   #Other issues.  She will have a high-dose flu shot today.  I also told her that is okay to travel by air.     APP VISIT 06/21/23   History of Present Illness   Today's video visit is 1 month follow-up.  She is followed for IPF.  She has been started on  Esbriet  but during last visit was having dizziness felt possibly secondary to Esbriet .  Esbriet  was placed on hold until October 12.  She was to restart Esbriet  and titrate up slowly as tolerable.  She was also referred to pulmonary rehab.  She would also recommend to continue on oxygen 2 L with activity The patient had previously been advised to stop taking Esbriet  due to concerns it was causing dizziness. The patient confirmed that the dizziness resolved after discontinuing the medication, but was unaware of the instruction to restart the medication on October 13th. The patient had previously reached the top dose of Esbriet  (three capsules three times daily) . Dizziness did start at lower doses.   In addition to fibrosis, the patient was also dealing with issues related to Pradaxa , a blood thinner, which was causing throat burning. The patient confirmed that switching to Eliquis  resolved this issue.  She denies any known bleeding . Education on not using NSAIDS.   The patient is using oxygen with activity and during physical therapy, as her oxygen level sometimes dropped to as low as 88. The patient is taking Symbicort  twice daily and using albuterol  infrequently. The patient was not very active outside of physical therapy and housework. Likes pulmonary rehab alot.   The patient is  also using a CPAP machine at bedtime and reported feeling rested with its use. The patient had recently been in the ER for chest pain . Workup was unrevealing with neg CT chest for PE or acute process. Labs unrevealing. plans to follow up with cardiology .    The patient had received a pneumonia vaccine in 2022 and was up to date on RSV vaccines.        OV 10/28/2023  Subjective:  Patient ID: [name], female , DOB: January 18, 1951 , age 22 y.o. , MRN: [id number] , ADDRESS: [address] [place] [address] PCP Leonce Carola PARAS, PA-C Patient Care Team: Leonce Carola PARAS DEVONNA as PCP - General  (Pediatrics) Fernande Elspeth BROCKS, MD as PCP - Electrophysiology (Cardiology) Dann Candyce RAMAN, MD as PCP - Cardiology (Cardiology) Curvin Deward MOULD, MD as Consulting Physician (General Surgery) Lanny Callander, MD as Consulting Physician (Hematology) Keenan Hastings, MD as Consulting Physician (Radiation Oncology) Tyree Nanetta SAILOR, RN as Registered Nurse Glean Stephane BROCKS, RN as Registered Nurse Fernande Elspeth BROCKS, MD as Consulting Physician (Cardiology) Letha Truman ORN, NP (Inactive) as Nurse Practitioner (Nurse Practitioner) Moses Powell Hummer, NP as Nurse Practitioner (Nurse Practitioner) Shellia Oh, MD (Inactive) as Consulting Physician (Pulmonary Disease) Curvin Deward MOULD, MD as Consulting Physician (General Surgery) Dolphus Reiter, MD as Consulting Physician (Rheumatology)  This Provider for this visit: Treatment Team:  Attending Provider: Geronimo Amel, MD    10/28/2023 -   Chief Complaint  Patient presents with   Follow-up    She c/o wheezing, esp at night x 2 months. Breathing  has been stable. She has nocturnal cough- non prod.     HPI Angelissa Supan 73 y.o. -returns for follow-up.  I last saw her in the fall 2024.  At that time she had some dizziness with pirfenidone  we stopped the pirfenidone  and then she saw a nurse practitioner.  Nurse practitioner put her on a rechallenge of pirfenidone .  She is currently on full dose pirfenidone  and she is tolerating it well without any dizziness.  She reports stable dyspnea.  She reports good tolerance with pirfenidone  except mild nausea that is transient.  She had pulmonary function test in March 2025 and it is stable.  Her main issue is that she is noticing nocturnal wheeze when she lies down and is in the sofa or in the bed.  However when I played the sound of her wheezing and crackles on YouTube she reported that it was crackles.  She had been reassured by the sleep specialist that this was consistent with the pulmonary  fibrosis.  I shared the same opinion.  However lab review shows that in the past in 2018 she had house dust mite allergy.  She is also on Singulair , Symbicort  and antihistamines at this point.  I did indicate to her we will check a CBC differential and blood IgE.  Also gave advice on house dust mite control.  Of note in January 2025 she ended up in the ER with enteritis and had elevated liver enzyme.  She requires a follow-up today.  Also of note she wants to know if she can drink alcohol .  I did tell her that we need the liver enzymes checked.  She is also on oxycodone .  I told her that multiple agents that can increase risk of liver injury.  She wanted to know if she could to a marijuana gummy I advised her against this.      OV 02/18/2024  Subjective:  Patient ID: [name], female , DOB: Mar 24, 1951 , age 71 y.o. , MRN: [id number] , ADDRESS: [address] [place] [address] PCP Leonce Carola PARAS, PA-C Patient Care Team: Leonce Carola PARAS DEVONNA as PCP - General (Pediatrics) Cindie Ole DASEN, MD as PCP - Electrophysiology (Cardiology) Curvin Deward MOULD, MD as Consulting Physician (General Surgery) Lanny Callander, MD as Consulting Physician (Hematology) Keenan Hastings, MD as Consulting Physician (Radiation Oncology) Tyree Nanetta SAILOR, RN as Registered Nurse Glean Stephane BROCKS, RN (Inactive) as Registered Nurse Fernande Elspeth BROCKS, MD as Consulting Physician (Cardiology) Letha Truman ORN, NP (Inactive) as Nurse Practitioner (Nurse Practitioner) Moses Powell Hummer, NP as Nurse Practitioner (Nurse Practitioner) Shellia Oh, MD (Inactive) as Consulting Physician (Pulmonary Disease) Curvin Deward MOULD, MD as Consulting Physician (General Surgery) Dolphus Reiter, MD as Consulting Physician (Rheumatology)  This Provider for this visit: Treatment Team:  Attending Provider: Geronimo Amel, MD    02/18/2024 -   Chief Complaint  Patient presents with   Follow-up    Breathing  is stable. She does c/o increased cough for the past 6-8 wks- non prod.    HPI [name] 73 y.o. -returns for follow-up.  She says she is feeling stable but she is mostly bothered by chronic cough with clearing of the throat for the last 2 months.  She states that when she is out of the house the cough is less.  She does have dust mite allergy and she says she is taking all the precautions but nevertheless when she is out of the house the cough is less.  She is also clearing the throat a lot.  She is asking for interventions.  But from a shortness of breath perspective she is stable.  She is tolerating Esbriet  well.  She had pulmonary function testing that shows a continued steady decline in DLCO.  Echocardiogram June 2025 with her pulmonary hypertension.  Last high-res CT was November 2023.  Last CT angiogram chest was August 2024 at the time of pulmonary embolism [post COVID] in terms of PE she is continuing her full dose Eliquis  without any problems 1 year since anticoagulation is coming up.  Despite the DLCO showing decline in her symptom burden and her excess hypoxemia test actually stable.  She is willing to get a high-resolution CT chest for monitoring purposes.  She will also need a D-dimer and BNP to assess for anticoagulation monitoring and also any evidence of pulmonary hypertension.  New issue is that her right toe is black.  She has seen the podiatrist and a biopsy is pending she wanted clearance.        OV 04/23/2024  Subjective:  Patient ID: [name], female , DOB: 05/04/1951 , age 40 y.o. , MRN: [id number] , ADDRESS: [address] [place] [address] PCP Leonce Carola PARAS, PA-C Patient Care Team: Leonce Carola PARAS DEVONNA as PCP - General (Pediatrics) Cindie Ole DASEN, MD as PCP - Electrophysiology (Cardiology) Curvin Deward MOULD, MD as Consulting Physician (General Surgery) Lanny Callander, MD as Consulting Physician (Hematology) Keenan Hastings, MD as  Consulting Physician (Radiation Oncology) Tyree Nanetta SAILOR, RN as Registered Nurse Glean Stephane BROCKS, RN (Inactive) as Registered Nurse Fernande Elspeth BROCKS, MD as Consulting Physician (Cardiology) Letha Truman ORN, NP (Inactive) as Nurse Practitioner (Nurse Practitioner) Moses Powell Hummer, NP as Nurse Practitioner (Nurse Practitioner) Shellia Oh, MD (Inactive) as Consulting Physician (Pulmonary Disease) Curvin Deward MOULD, MD as Consulting Physician (General Surgery) Dolphus Reiter, MD as Consulting Physician (Rheumatology)  This Provider for this visit: Treatment Team:  Attending Provider: Geronimo Amel, MD    04/23/2024 -   Chief Complaint  Patient presents with   Medical Management of Chronic Issues   Interstitial Lung Disease    Breathing has been stable. No new co's.      HPI [name] 73 y.o. -returns for follow-up.  Since her last visit she has had toe surgery and for her history no malignancy was found.  She did not have any excision of the toe.  Reviewed medical records indicate she had this on April 02, 2024.  Otherwise no changes in symptoms.  Tolerating pirfenidone  well.  The main question at this visit is if she is having pulmonary hypertension or worsening pulmonary fibrosis.  She did have an echocardiogram in June 2025 that showed evidence of pulmonary hypertension.  In addition this is a significant drop in DLCO.  Both parameters suggest pulmonary hypertension but the BNP at last visit was normal.  Without a CT scan therefore.  I personally visualized the this also enlarged pulmonary trace.  Therefore the concern is both worsening pulmonary fibrosis and onset of pulmonary hypertension.    CT Chest data from date: 04/13/24  - personally visualized and independently interpreted : ye - my findings are: below Narrative & Impression  CLINICAL DATA:  Pulmonary fibrosis.   EXAM: CT CHEST WITHOUT CONTRAST   TECHNIQUE: Multidetector CT imaging of the chest  was performed following the standard protocol without intravenous contrast. High resolution imaging of the lungs, as well as inspiratory and expiratory imaging, was performed.  RADIATION DOSE REDUCTION: This exam was performed according to the departmental dose-optimization program which includes automated exposure control, adjustment of the mA and/or kV according to patient size and/or use of iterative reconstruction technique.   COMPARISON:  06/17/2023, 07/06/2022.   FINDINGS: Cardiovascular: Atherosclerotic calcification of the aorta and coronary arteries. Enlarged pulmonic trunk and heart. No pericardial effusion.   Mediastinum/Nodes: Thoracic inlet lymph nodes are not enlarged by CT size criteria. No pathologically enlarged mediastinal or axillary lymph nodes. Hilar regions are difficult to definitively evaluate without IV contrast. Lumpectomy clips in the right breast. Esophagus is grossly unremarkable.   Lungs/Pleura: Peripheral and basilar predominant subpleural reticulation, coarsened ground-glass and traction bronchiectasis/bronchiolectasis with basilar honeycombing, stable to minimally progressive from 06/17/2023 and clearly progressive from 07/06/2022. No pleural fluid. Airway is unremarkable. Mild air trapping.   Upper Abdomen: Visualized portions of the liver, adrenal glands, kidneys, spleen, pancreas, stomach and bowel are grossly unremarkable. No upper abdominal adenopathy.   Musculoskeletal: Degenerative changes in the spine.   IMPRESSION: 1. Pulmonary parenchymal pattern of interstitial lung disease, as detailed above, similar to minimally progressive from 06/17/2023 and definitely progressive from 07/06/2022. Findings are consistent with UIP per consensus guidelines: Diagnosis of Idiopathic Pulmonary Fibrosis: An Official ATS/ERS/JRS/ALAT Clinical Practice Guideline. Am JINNY [employer] Crit Care Med Vol 198, Iss 5, [phone], Apr 20 2017. 2. Aortic  atherosclerosis (ICD10-I70.0). Coronary artery calcification. 3. Enlarged pulmonic trunk, indicative of pulmonary arterial hypertension.     Electronically Signed   By: Newell Eke M.D.   On: 04/14/2024 17:26      ECHO June 2025   IMPRESSIONS     1. Left ventricular ejection fraction, by estimation, is 45 to 50%. The  left ventricle has mildly decreased function. The left ventricle  demonstrates global hypokinesis. There is mild concentric left ventricular  hypertrophy. Left ventricular diastolic  parameters are consistent with Grade I diastolic dysfunction (impaired  relaxation).   2. Right ventricular systolic function is normal. The right ventricular  size is normal. There is mildly elevated pulmonary artery systolic  pressure.   3. The mitral valve is normal in structure. Trivial mitral valve  regurgitation. No evidence of mitral stenosis.   4. The aortic valve is tricuspid. Aortic valve regurgitation is not  visualized. No aortic stenosis is present.   5. The inferior vena cava is normal in size with greater than 50%  respiratory variability, suggesting right atrial pressure of 3 mmH  OV 06/24/2024  Subjective:  Patient ID: [name], female , DOB: 1951-08-15 , age 29 y.o. , MRN: [id number] , ADDRESS: [address] [place] [address] PCP Leonce Carola JINNY, PA-C Patient Care Team: Leonce Carola [name] as PCP - General (Pediatrics) Cindie Ole DASEN, MD as PCP - Electrophysiology (Cardiology) Curvin Deward MOULD, MD as Consulting Physician (General Surgery) Lanny Callander, MD as Consulting Physician (Hematology) Keenan Hastings, MD as Consulting Physician (Radiation Oncology) Tyree Nanetta SAILOR, RN as Registered Nurse Fernande Elspeth BROCKS, MD (Inactive) as Consulting Physician (Cardiology) Letha Truman ORN, NP (Inactive) as Nurse Practitioner (Nurse Practitioner) Moses Powell Hummer, NP as Nurse Practitioner (Nurse Practitioner) Shellia Oh, MD as  Consulting Physician (Pulmonary Disease) Curvin Deward MOULD, MD as Consulting Physician (General Surgery) Dolphus Reiter, MD as Consulting Physician (Rheumatology)  This Provider for this visit: Treatment Team:  Attending Provider: Geronimo Amel, MD   #IPF patient unable to start pirfenidone  because of previous elevations in liver enzymes deemed as fatty liver by GI  - LAST HRCT Nov 2023  -  Last CT (angion) aug 2024\   - aug 2025 with prgression   #Outpatient COVID-19 in July 2024 followed by pulmonary embolism mid August 2024.  [Previous history of VTE]  -Pradaxa  changed to Eliquis  in fall 2024 after burning in the throat n ddonig well  #Sleep apnea in the process of transfer from Dr. Shellia to Dr. MALVA  #Esbriet /Pirfenidone  requires intensive drug monitoring due to high concerns for Adverse effects of , including  Drug Induced Liver Injury, significant GI side effects that include but not limited to Diarrhea, Nausea, Vomiting,  and other system side effects that include Fatigue, headaches, weight loss and other side effects such as skin rash. These will be monitored with  blood work such as LFT initially once a month for 6 months and then quarterly On   - ELEVated LFT on and off  - Last Jan 2025 and sept 2025  #Nonischemic cardiomyopathy ejection fraction 45-50% even in 2021 with normal cardiac stress test most recent echo late 2024.  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 No new concerns.      HPI [name] 73 y.o. - Prue Lingenfelter is a 73 year old female with pulmonary fibrosis  Post RHC.  Since her last clinic visit she was in [place] .  She recently spent four weeks in [place], [place] , due to her nephew's illness and subsequent passing from congestive heart failure.  When she was there her defibrillator malfunction.  And apparently some nebulizer was stopped.  Since then she is followed by Dr. Ole Holts  In terms of her right heart cath: Results are above.  It is diagnosed with WHO group 3 pulmonary hypertension.  I counseled her about indication to treat this with inhaled treprostinil.  Short of the evidence.  Potential benefit and also moderating IPF.  Side effect profile and intolerance of at least 25% of patients quitting the drug because of cough congestion headache.  She is willing to try this.  She prefers the DPI.  I made a referral to the pharmacy team and also contacted Aleck Puls the pharmacist.  She remains on Symbicort  was wondering about continuing this.  For now she will continue this.   Research protocol.  The right heart cath was done research protocol.  She got billed unexpectedly for $450.  I have contacted the finance team to inquire into this and if this is due to research then revoked the charge and prevent from going to the patient or patient insurance.  She remains interested in future research studies which we can consider for group 3 PAH.   In terms of IPF: This is stable  In terms of pirfenidone  uptake: Liver function test intermittently high.  She is not having any side effects but we  will check LFTs today  In terms of chronic cough: External records reviewed she is seen voice rehab and her cough is better.   SYMPTOM SCALE - ILD 08/23/2022 11/22/2022  05/23/2023  10/28/2023 Esbriet .  02/18/2024 esbiret 04/23/2024  06/24/2024 Nowwit dix of PAH. On esbreit  Current weight    Pirfenidone  -today goes to 3 pills 3 times daily      O2 use x ra ra ra ra ra ra  Shortness of Breath 0 -> 5 scale with 5 being worst (score 6 If unable to do)        At rest 0 2 1 1 1 1 1   Simple tasks - showers, clothes change, eating, shaving 2 3 3 3 3 2 2   Household (dishes, doing bed, laundry) 1 4 3 3 3 2 3   Shopping 1 3 3 2 2 2 2   Walking level at own pace 2 3 3 2 1 2 3   Walking up Stairs 3 5 5 5 4 4 4   Total (30-36) Dyspnea Score 9 20 18 16 14 13 15     Non-dyspnea symptoms (0-> 5 scale) 08/23/2022 01/03/2023  05/23/2023  10/28/2023  02/18/2024  04/23/2024  06/24/2024   How bad is your cough? 0 3 3 2 3 2 1   How bad is your fatigue 0 2 3 1 2 2 1   How bad is nausea 00 0 0 0 0 0 0  How bad is vomiting?  0 0 0 0 0 0 0  How bad is diarrhea? 0 0 0 1 0 0 00  How bad is anxiety? 2 4 3 2 2 2  0  How bad is depression 2 5 4 2 1 2 1   Any chronic pain - if so where and how bad 1 x do spirometry and x Okay BLACK RIGHT TOE Jardiance  -vaginal itch 0      SIT STAND TEST - goal 15 times   02/18/2024    O2 used ra   PRobe - finter or forehead finger   Number sit and stand completed - goal 15 15   Time taken to complete 15 sec   Resting Pulse Ox/HR/Dyspnea  96% and 83/min and dyspnea of 2/10    Peak measures 95 % and 103/min and dyspnea of 4/10   Final Pulse Ox/HR 91% and 94/min and dyspnea of 2/10   Desaturated </= 88% no   Desaturated <= 3% points yes   Got Tachycardic >/= 90/min yes   Miscellaneous comments Level 4 dyspnea    =  PFT     Latest Ref Rng & Units 02/12/2024    1:54 PM 10/22/2023   11:01 AM 07/15/2023    1:56 PM 02/19/2023   11:26 AM 08/24/2022    1:55 PM 06/08/2022    3:49 PM 10/16/2018   12:55 PM  PFT Results  FVC-Pre L 2.27  2.26  2.30  2.25  2.30  2.33  2.52   FVC-Predicted Pre % 71  70  72  71  72  73  96   FVC-Post L      2.36  2.49   FVC-Predicted Post %      74  94   Pre  FEV1/FVC % % 85  94  91  88  92  93  90   Post FEV1/FCV % %      93  91   FEV1-Pre L 1.93  2.13  2.08  1.97  2.12  2.17  2.27  FEV1-Predicted Pre % 80  88  87  82  88  90  111   FEV1-Post L      2.20  2.26   DLCO uncorrected ml/min/mmHg 8.97  15.53  10.02  11.85  11.05  12.95  12.43   DLCO UNC% % 42  73  48  56  52  62  59   DLCO corrected ml/min/mmHg  15.05  10.14  11.85  11.05  12.95    DLCO COR %Predicted %  71  48  56  52  62    DLVA Predicted % 70  103  81  94  87  93  85   TLC L      3.76  4.00   TLC % Predicted %      69  74   RV % Predicted %      55  58        LAB RESULTS last 96 hours No results found.       has a past medical history of AICD (automatic cardioverter/defibrillator) present, Allergy (09/20/1994), Anxiety, Arthritis, Back pain, Breast cancer (HCC) (2016), Bronchiectasis (HCC), Cerebral aneurysm, nonruptured, CHF (congestive heart failure) (HCC), Clostridium difficile infection, Depressive disorder, not elsewhere classified, Diverticulosis of colon (without mention of hemorrhage), Esophageal reflux, Fatty liver, Fibromyalgia, Gastritis, GERD (gastroesophageal reflux disease), GI bleed (2004), Glaucoma, Hiatal hernia, History of COVID-19 (05/04/2021), Hyperlipidemia, Hypertension, Hypothyroidism, Internal hemorrhoids, Joint pain, Multifocal pneumonia (06/03/2021), Obstructive sleep apnea (adult) (pediatric), Osteoarthritis, Ostium secundum type atrial septal defect, Other chronic nonalcoholic liver disease, Other pulmonary embolism and infarction, Palpitations, Paroxysmal ventricular tachycardia (HCC), Personal history of radiation therapy, PONV (postoperative nausea and vomiting), Presence of permanent cardiac pacemaker, PUD (peptic ulcer disease), Radiation (02/03/15-03/10/15), Sarcoid, Schatzki's ring, Shortness of breath, Sleep apnea, Stroke (HCC) (2013), Takotsubo syndrome, Tubular adenoma of colon, Unspecified transient cerebral ischemia, and Unspecified vitamin D   deficiency.   reports that she quit smoking about 34 years ago. Her smoking use included cigarettes. She started smoking about 54 years ago. She has a 20 pack-year smoking history. She has been exposed to tobacco smoke. She has never used smokeless tobacco.  Past Surgical History:  Procedure Laterality Date   ABDOMINAL HYSTERECTOMY     ABI  2006   normal   BONE EXOSTOSIS EXCISION Right 04/02/2024   Procedure: EXCISION, EXOSTOSIS;  Surgeon: Kit Rush, MD;  Location: [facility] OR;  Service: Orthopedics;  Laterality: Right;   BREAST EXCISIONAL BIOPSY     BREAST LUMPECTOMY Right 2016   BREAST LUMPECTOMY WITH RADIOACTIVE SEED LOCALIZATION Right 01/03/2015   Procedure: BREAST LUMPECTOMY WITH RADIOACTIVE SEED LOCALIZATION;  Surgeon: Deward Null III, MD;  Location: [facility] OR;  Service: General;  Laterality: Right;   CARDIAC DEFIBRILLATOR PLACEMENT  2006; 2012   BSX single chamber ICD   CARDIAC DEFIBRILLATOR PLACEMENT  2025   carotid dopplers  2006   neg   CEREBRAL ANEURYSM REPAIR  02/1999   COLONOSCOPY     EYE SURGERY Right 02/2023   surgery center - for cataracts   HAMMER TOE SURGERY  11/15/2020   3rd digit bilateral feet    hospitalization  2004   GI bleed, PUD, diverticulosis (EGD,colonscopy)   hospitalization     PE, NSVT, s/p defib   JOINT REPLACEMENT  05/2021   KNEE ARTHROSCOPY     bilateral   LEFT HEART CATHETERIZATION WITH CORONARY ANGIOGRAM N/A 02/22/2012   Procedure: LEFT HEART CATHETERIZATION WITH CORONARY ANGIOGRAM;  Surgeon: Lonni [name] Cash, MD;  Location:  [facility] CATH LAB;  Service: Cardiovascular;  Laterality: N/A;   PARTIAL HYSTERECTOMY     Fibroids   RIGHT HEART CATH N/A 06/18/2024   Procedure: RIGHT HEART CATH;  Surgeon: Rolan Ezra RAMAN, MD;  Location: [facility] INVASIVE CV LAB;  Service: Cardiovascular;  Laterality: N/A;   TONGUE BIOPSY  12/12/2017   due to sore tongue and white patches/abnormal cells   TOTAL KNEE ARTHROPLASTY Left 02/13/2016   Procedure: TOTAL KNEE  ARTHROPLASTY;  Surgeon: Marcey Raman, MD;  Location: [facility] OR;  Service: Orthopedics;  Laterality: Left;   TOTAL KNEE ARTHROPLASTY Right 05/22/2021   Procedure: TOTAL KNEE ARTHROPLASTY;  Surgeon: Raman Marcey, MD;  Location: [facility] ORS;  Service: Orthopedics;  Laterality: Right;  with block   TUBAL LIGATION     UPPER GASTROINTESTINAL ENDOSCOPY      Allergies  Allergen Reactions   Ace Inhibitors Swelling    Angioedema; makes tongue break out    Amitriptyline Hcl Other (See Comments)     makes her too sleepy!   Atorvastatin Other (See Comments)    SEVERE MYALGIA   Dilantin [Phenytoin Sodium Extended] Rash    Severe rash   Duloxetine  Hcl Nausea Only and Other (See Comments)    Sleepiness/ sick  Other Reaction(s): GI Intolerance   Paroxetine Nausea Only    Rapid heartbeat   Ramipril  Other (See Comments)    TONGUE ULCERS    Rosuvastatin Other (See Comments)    SEVERE MYALGIA   Pradaxa  [Dabigatran  Etexilate Mesylate]     Throat irritation   Carbamazepine Rash   Codeine  Itching   Phenytoin Sodium Extended Rash   Simvastatin  Other (See Comments)    Increase in CK, myalgias   Wellbutrin  [Bupropion ] Palpitations    Immunization History  Administered Date(s) Administered   Fluad Quad(high Dose 65+) 04/17/2019, 04/30/2020   Fluad Trivalent(High Dose 65+) 05/23/2023   H1N1 07/24/2008   INFLUENZA, HIGH DOSE SEASONAL PF 05/30/2017, 05/21/2018, 04/20/2024   Influenza Split 07/17/2011, 09/27/2011, 04/29/2012   Influenza Whole 07/20/2004, 07/04/2007, 05/07/2008, 05/19/2009, 05/17/2010   Influenza,inj,Quad PF,6+ Mos 05/20/2013, 05/06/2014, 05/17/2015, 05/15/2016, 04/27/2021   Influenza-Unspecified 05/07/2018   PFIZER(Purple Top)SARS-COV-2 Vaccination 09/25/2019, 10/16/2019, 06/04/2020, 01/04/2021, 08/06/2021   PNEUMOCOCCAL CONJUGATE-20 08/06/2021   Pfizer Covid-19 Vaccine Bivalent Booster 30yrs & up 05/07/2022   Pneumococcal Conjugate-13 11/22/2015, 08/06/2021   Pneumococcal  Polysaccharide-23 05/20/2002, 05/07/2008, 06/25/2014   Respiratory Syncytial Virus Vaccine ,Recomb Aduvanted(Arexvy ) 06/26/2022   Td 09/07/2009   Zoster, Live 09/21/2011    Family History  Problem Relation Age of Onset   Diabetes Mother    Alzheimer's disease Mother    Hypertension Mother    Obesity Mother    Arthritis Mother    Heart disease Father    Seizures Sister    Allergies Sister    Colon polyps Sister    Heart disease Sister    Early death Sister    Anxiety disorder Sister    Arthritis Sister    Depression Sister    Hypertension Sister    Other Brother        Thyroid  problem 10/2016   Hearing loss Brother    Vision loss Brother    Heart disease Brother    Prostate cancer Brother 81       same brother as throat cancer   Throat cancer Brother        dx in his 35s; also a smoker   Arthritis Brother    Hypertension Brother    Breast cancer Maternal Aunt 40   Colon cancer  Maternal Aunt 74       same sister as breast at 14   Breast cancer Maternal Aunt        dx in her 85s   Lung cancer Maternal Uncle    Cancer Maternal Grandmother 97       colon cancer or abdominal cancer   Lung cancer Maternal Grandfather 62   Heart disease Son        Cardiac Arrest 07/2016   Pancreatic cancer Cousin 37       maternal first cousin   Breast cancer Cousin        paternal first cousin twice removed died in her 30s   Congestive Heart Failure Nephew    Stroke Neg Hx    Esophageal cancer Neg Hx    Stomach cancer Neg Hx      Current Outpatient Medications:    albuterol  (VENTOLIN  HFA) 108 (90 Base) MCG/ACT inhaler, INHALE 2 PUFFS INTO THE LUNGS EVERY 6 (SIX) HOURS AS NEEDED FOR WHEEZING OR SHORTNESS OF BREATH., Disp: 3 each, Rfl: 5   azelastine  (ASTELIN ) 0.1 % nasal spray, Place 1 spray into both nostrils 2 (two) times daily. Use in each nostril as directed, Disp: 90 mL, Rfl: 3   benzonatate  (TESSALON ) 200 MG capsule, Take 1 capsule (200 mg total) by mouth 3 (three) times daily  as needed for cough., Disp: 90 capsule, Rfl: 2   budesonide -formoterol  (SYMBICORT ) 80-4.5 MCG/ACT inhaler, Inhale 1 puff into the lungs 2 (two) times daily., Disp: 3 each, Rfl: 4   carvedilol  (COREG ) 3.125 MG tablet, Take 1 tablet (3.125 mg total) by mouth 2 (two) times daily., Disp: 180 tablet, Rfl: 3   Cholecalciferol  (DIALYVITE VITAMIN D  5000) 125 MCG (5000 UT) capsule, Take 5,000 Units by mouth daily., Disp: , Rfl:    cloBAZam  (ONFI ) 10 MG tablet, Take 1 tablet by mouth twice daily, Disp: 180 tablet, Rfl: 1   ELIQUIS  5 MG TABS tablet, TAKE 1 TABLET TWICE DAILY, Disp: 180 tablet, Rfl: 3   empagliflozin  (JARDIANCE ) 10 MG TABS tablet, TAKE 1 TABLET EVERY DAY, Disp: 90 tablet, Rfl: 3   famotidine  (PEPCID ) 40 MG tablet, TAKE 1 TABLET AT BEDTIME (NEED MD APPOINTMENT), Disp: 90 tablet, Rfl: 3   FOLIC ACID  PO, Take 1 tablet by mouth daily., Disp: , Rfl:    furosemide  (LASIX ) 20 MG tablet, TAKE 2 TABLETS (40 MG) 3 DAYS PER WEEK AND TAKE 1 TABLET (20 MG) THE OTHER DAYS OF THE WEEK, Disp: 130 tablet, Rfl: 2   hydroquinone 4 % cream, Apply 1 Application topically at bedtime., Disp: , Rfl:    hydroxypropyl methylcellulose / hypromellose (ISOPTO TEARS / GONIOVISC) 2.5 % ophthalmic solution, Place 1 drop into both eyes at bedtime., Disp: , Rfl:    latanoprost  (XALATAN ) 0.005 % ophthalmic solution, Place 1 drop into both eyes at bedtime., Disp: , Rfl:    levocetirizine (XYZAL ) 5 MG tablet, Take 5 mg by mouth at bedtime., Disp: , Rfl:    levothyroxine  (SYNTHROID ) 50 MCG tablet, TAKE 1 TABLET EVERY DAY, Disp: 90 tablet, Rfl: 3   magnesium  oxide (MAG-OX) 400 (240 Mg) MG tablet, TAKE 1 TABLET TWICE DAILY, Disp: 180 tablet, Rfl: 3   montelukast  (SINGULAIR ) 10 MG tablet, TAKE 1 TABLET AT BEDTIME, Disp: 90 tablet, Rfl: 3   Multiple Vitamins-Minerals (MULTIVITAMIN PO), Take 1 tablet by mouth daily., Disp: , Rfl:    oxyCODONE  (OXY IR/ROXICODONE ) 5 MG immediate release tablet, Take 0.5-1 tablets (2.5-5 mg total) by  mouth 2 (  two) times daily as needed for severe pain (pain score 7-10). 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 **CHECK LIVER FUNCTION EVERY 3 MONTHS**, Disp: 270 tablet, Rfl: 2   polyethylene glycol powder (GLYCOLAX /MIRALAX ) powder, Take 17 grams by mouth daily, Disp: 1080 g, Rfl: 0   potassium chloride  (KLOR-CON  M) 10 MEQ tablet, TAKE 1/2 TABLET EVERY DAY, Disp: 45 tablet, Rfl: 3   pregabalin  (LYRICA ) 25 MG capsule, Take 1 capsule (25 mg total) by mouth 3 (three) times daily. Start with 1 pill/day and work up s discussed- for neurogenic cough- stopping gabapentin , Disp: 90 capsule, Rfl: 5   Respiratory Therapy Supplies (FLUTTER) DEVI, 1 Device by Does not apply route as needed., Disp: 1 each, Rfl: 0   spironolactone (ALDACTONE) 25 MG tablet, Take 0.5 tablets (12.5 mg total) by mouth daily., Disp: 45 tablet, Rfl: 3   tiZANidine  (ZANAFLEX ) 4 MG tablet, TAKE 1/2 TO 1 TABLET (2 - 4 MG) TWICE A DAY, Disp: 180 tablet, Rfl: 3   traZODone  (DESYREL ) 100 MG tablet, TAKE 1 TO 1 AND 1/2 TABLETS AT BEDTIME, Disp: 135 tablet, Rfl: 3   triamcinolone  cream (KENALOG ) 0.1 %, Apply topically 2 (two) times daily., Disp: , Rfl:   Current Facility-Administered Medications:    lidocaine  (XYLOCAINE ) 1 % (with pres) injection 1 mL, 1 mL, Other, Once, [name], Megan, MD   lidocaine  (XYLOCAINE ) 1 % (with pres) injection 3 mL, 3 mL, Other, Once, [name], Megan, MD      Objective:   Vitals:   06/24/24 1537  BP: 124/82  Pulse: 86  SpO2: 98%  Weight: 208 lb (94.3 kg)  Height: 5' 6.5 (1.689 m)    Estimated body mass index is 33.07 kg/m as calculated from the following:   Height as of this encounter: 5' 6.5 (1.689 m).   Weight as of this encounter: 208 lb (94.3  kg).  @WEIGHTCHANGE @  [employer]   06/24/24 1537  Weight: 208 lb (94.3 kg)     Physical Exam   General: No distress. Looks well O2 at rest: no Cane present: no Sitting in wheel chair: no Frail: no Obese: yes Neuro: Alert and Oriented x 3. GCS 15. Speech normal Psych: Pleasant Resp:  Barrel Chest - no.  Wheeze - no, Crackles - yes, No overt respiratory distress CVS: Normal heart sounds. Murmurs - no Ext: Stigmata of Connective Tissue Disease - no HEENT: Normal upper airway. PEERL +. No post nasal drip        Assessment/     Assessment & Plan IPF (idiopathic pulmonary fibrosis) (HCC)  WHO group 3 pulmonary arterial hypertension (HCC)  Encounter for therapeutic drug monitoring  Chronic cough    PLAN Patient Instructions  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  Plan  Start tyvaso standard of care DPI Then next step consider Phocus Pulmovant study    IPF (idiopathic pulmonary fibrosis) (HCC) Medication monitoring encounter History of abnormal liver function  -Currently tolerating Esbriet  well without any side effects although liver function test was elevated in January 2025 and sept 2025 chronically    Plan -Check liver function test today 06/24/2024 - continue esbriet  - do spirometry and dlco in 2 months  History of 2019 novel coronavirus disease (COVID-19) History of pulmonary embolism April 02, 2023 Throat burning with Pradaxa   - Doing well on Eliquis    Plan -Continue Eliquis  full doe   Chronic cough 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 History of asthma   - 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 - Noted you stopped gabapentin  bcuase it made you tired - glad cough better after seeing Leita Aures  Plan ---Continue allergy/asthma treatment of Symbicort  and Singulair , Flonase  and Allegra as before - Continue house dust mite control as you have been doing - continue teassoln perles    OSA (obstructive sleep  apnea)   Plan - Under the care of  sleep doctor Dr. MALVA   Followup  -- 2 months to  to see Dr. Geronimo in a 30-minute visit after spiro/dlco  -Symptom score and exercise hypoxemia test at follow-up      FOLLOWUP    Return for  -- 2 months to  to see Dr. Geronimo in a 30-minute visit after spiro/dlco.  ( Level 05 visit E&M 2024: Estb >= 40 min   visit type: on-site physical face to visit  in total care time and counseling or/and coordination of care by this undersigned MD - Dr Dorethia Geronimo. This includes one or more of the following on this same day 06/24/2024: pre-charting, chart review, note writing, documentation discussion of test results, diagnostic or treatment recommendations, prognosis, risks and benefits of management options, instructions, education, compliance or risk-factor reduction. It excludes time spent by the CMA or office staff in the care of the patient. Actual time 40 min)   SIGNATURE    Dr. Dorethia Geronimo, M.D., F.C.C.P,  Pulmonary and Critical Care Medicine Staff Physician, [facility] Director - Interstitial Lung Disease  Program  Pulmonary Fibrosis [facility] at [facility] [place], [place], [address]  Pager: [phone], If no answer or between  15:00h - 7:00h: call [phone] Telephone: [phone]  4:37 PM 06/24/2024   Moderate Complexity MDM OFFICE  2021 E/M guidelines, first released in 2021, with minor revisions added in 2023 and 2024 Must meet the requirements for 2 out of 3 dimensions to qualify.    Number and complexity of problems addressed Amount and/or complexity of data reviewed Risk of complications and/or morbidity  One or more chronic illness with mild exacerbation, OR progression, OR  side effects of treatment  Two or more stable chronic illnesses  One undiagnosed new problem with uncertain prognosis  One acute illness with systemic symptoms   One Acute complicated injury  Must meet the requirements for 1 of 3 of the categories)  Category 1: Tests and documents, historian  Any combination of 3 of the following:  Assessment requiring an independent historian  Review of prior external note(s) from each unique source  Review of results of each unique test  Ordering of each unique test    Category 2: Interpretation of tests   Independent interpretation of a test performed by another physician/other qualified health care professional (not separately reported)  Category 3: Discuss management/tests  Discussion of management or test interpretation with external physician/other qualified health care professional/appropriate source (not separately reported) Moderate risk of morbidity from additional diagnostic testing or treatment Examples only:  Prescription drug management  Decision regarding minor surgery with identfied patient or procedure risk factors  Decision regarding elective major surgery without identified patient or procedure risk factors  Diagnosis or treatment significantly limited by social determinants of health  HIGh Complexity  OFFICE   2021 E/M guidelines, first released in 2021, with minor revisions added in 2023. Must meet the requirements for 2 out of 3 dimensions to qualify.    Number and complexity of problems addressed Amount and/or complexity of data reviewed Risk of complications and/or morbidity  Severe exacerbation of chronic illness  Acute or chronic illnesses that may pose a threat to life or bodily function, e.g., multiple trauma, acute MI, pulmonary embolus, severe respiratory distress, progressive rheumatoid arthritis, psychiatric illness with potential threat to self or others, peritonitis, acute renal failure, abrupt change in neurological status Must meet the requirements for 2 of 3 of the categories)  Category 1: Tests and documents, historian  Any combination of 3 of the  following:  Assessment requiring an independent historian  Review of prior external note(s) from each unique source  Review of results of each unique test  Ordering of each unique test    Category 2: Interpretation of tests    Independent interpretation of a test performed by another physician/other qualified health care professional (not separately reported)  Category 3: Discuss management/tests  Discussion of management or test interpretation with external physician/other qualified health care professional/appropriate source (not separately reported)  HIGH risk of morbidity from additional diagnostic testing or treatment Examples only:  Drug therapy requiring intensive monitoring for toxicity  Decision for elective major surgery with identified pateint or procedure risk factors  Decision regarding hospitalization or escalation of level of care  Decision for DNR or to de-escalate care   Parenteral controlled  substances            LEGEND - Independent interpretation involves the interpretation of a test for which there is a CPT code, and an interpretation or report is customary. When a review and interpretation of a test is performed and documented by the provider, but not separately reported (billed), then this would represent an independent interpretation. This report does not need to conform to the usual standards of a complete report of the test. This does not include interpretation of tests that do not have formal reports such as a complete blood count with differential and blood cultures. Examples would include reviewing a chest radiograph and documenting in the medical record an interpretation, but not separately reporting (billing) the interpretation of the chest radiograph.   An appropriate source includes professionals who are not health care professionals but may be involved in the management of the patient, such as a clinical research associate, upper officer, case manager  or teacher, and does not include discussion with family or informal caregivers.    - SDOH: SDOH are the conditions in the environments where people are born, live, learn, work, play, worship, and age that affect a wide range of health, functioning, and quality-of-life outcomes and risks. (e.g., housing, food insecurity, transportation, etc.). SDOH-related Z codes ranging from Z55-Z65 are the ICD-10-CM diagnosis codes used to document SDOH data Z55 - Problems related to education and literacy Z56 - Problems related to employment and unemployment Z57 - Occupational exposure to risk factors Z58 - Problems related to physical environment Z59 - Problems related to housing and economic circumstances [phone] - Problems related to social environment [phone] - Problems related to upbringing [phone] - Other problems related to primary support group, including family circumstances Z37 - Problems related to certain psychosocial circumstances Z65 - Problems related to other psychosocial circumstances

## 2024-06-25 ENCOUNTER — Telehealth: Payer: Self-pay

## 2024-06-25 LAB — HEPATIC FUNCTION PANEL
ALT: 38 U/L — ABNORMAL HIGH (ref 0–35)
AST: 41 U/L — ABNORMAL HIGH (ref 0–37)
Albumin: 4 g/dL (ref 3.5–5.2)
Alkaline Phosphatase: 132 U/L — ABNORMAL HIGH (ref 39–117)
Bilirubin, Direct: 0 mg/dL (ref 0.0–0.3)
Total Bilirubin: 0.3 mg/dL (ref 0.2–1.2)
Total Protein: 7.3 g/dL (ref 6.0–8.3)

## 2024-06-25 NOTE — Telephone Encounter (Signed)
 Received Tyvaso new start paperwork. Submitted a Prior Authorization request to [facility] for TYVASO DPI via CoverMyMeds. Will update once we receive a response.  Key: [id number]

## 2024-06-26 NOTE — Telephone Encounter (Signed)
 Received notification from [employer] regarding a prior authorization for TYVASO DPI. Authorization has been APPROVED from to 08/19/25. Approval letter sent to scan center.  Phone # [phone]

## 2024-06-29 ENCOUNTER — Other Ambulatory Visit (HOSPITAL_COMMUNITY): Payer: Self-pay

## 2024-06-29 ENCOUNTER — Other Ambulatory Visit: Payer: Self-pay

## 2024-06-29 NOTE — Telephone Encounter (Signed)
 Per test claim copay for 28 day supply is $0. Will proceed with submitting enrollment form.

## 2024-06-30 ENCOUNTER — Telehealth: Payer: Self-pay | Admitting: Internal Medicine

## 2024-06-30 ENCOUNTER — Telehealth (HOSPITAL_BASED_OUTPATIENT_CLINIC_OR_DEPARTMENT_OTHER): Payer: Self-pay

## 2024-06-30 NOTE — Telephone Encounter (Signed)
 Dr. Kriste,  You saw this patient on 06/05/2024. She is pending cataract surgery with goniotomy on 11/20. I want to make sure she is safe to proceed so soon after her device gen change on 10/8.    Please route your response to P CV DIV Preop. I will communicate with requesting office once you have given recommendations.   Thank you!  Barnie Hila, NP

## 2024-06-30 NOTE — Telephone Encounter (Signed)
 Ok for eye surgery wit topical anesthesia. Low risk

## 2024-06-30 NOTE — Telephone Encounter (Signed)
 Completed Tyvaso enrollment form and faxed with insurance card, med list and pa approval letter to [facility]. Will update when we receive a response.  Phone #: [phone] Fax #: [phone]

## 2024-06-30 NOTE — Telephone Encounter (Signed)
   Pre-operative Risk Assessment    Patient Name: [name]  DOB: [birth year] MRN: [id number]   Date of last office visit: 06/05/24 with Dr. Kriste Date of next office visit: 09/07/24 with Dr. Kriste    Request for Surgical Clearance    Procedure:  Cataract Left Eye with Goniotomy  Date of Surgery:  Clearance 07/09/24                                 Surgeon:  Dr. Octavia Surgeon's Group or Practice Name:  [facility] Phone number:  [phone] Fax number:  [phone]   Type of Clearance Requested:   - Medical  - Pharmacy:  Hold Apixaban  (Eliquis ) not indicated   Type of Anesthesia:  Topical   Additional requests/questions:    [name] [name] [name]   06/30/2024, 11:57 AM

## 2024-06-30 NOTE — Telephone Encounter (Signed)
 Fax received from Dr. Octavia with [facility] to perform a catarat left eye with goniotomy on patient with topical anesthesia Patient needs surgery clearance. Surgery is pending. Patient was seen on 06/24/24. Office protocol is a risk assessment can be sent to surgeon if patient has been seen in 60 days or less.   Sending to Dr Geronimo for risk assessment or recommendations if patient needs to be seen in office prior to surgical procedure.    They are requesting to stop her Eliquis  prior to procedure

## 2024-06-30 NOTE — Telephone Encounter (Signed)
 Please advise holding Eliquis  prior to cataract with goniotomy on 11/20. Last labs October 2025.   Thank you!  DW

## 2024-06-30 NOTE — Telephone Encounter (Signed)
 Device clearance previously provided.   Refaxed to Dr. Octavia.

## 2024-07-01 MED ORDER — CARVEDILOL 3.125 MG PO TABS: 3.1250 mg | ORAL_TABLET | Freq: Two times a day (BID) | ORAL | 3 refills | Status: AC

## 2024-07-01 MED ORDER — SPIRONOLACTONE 25 MG PO TABS: 12.5000 mg | ORAL_TABLET | Freq: Every day | ORAL | 3 refills | Status: DC

## 2024-07-01 NOTE — Telephone Encounter (Signed)
 Per previous clearance encounter on 08/2023, Eliquis  is managed by Dr. Geronimo, pulmonologist. He will need to give recommendations on holding. Thank you.

## 2024-07-01 NOTE — Telephone Encounter (Signed)
   Name: [name]  DOB: 1951/07/31  MRN: [id number]   Primary Cardiologist: None  Chart reviewed as part of pre-operative protocol coverage. [name] underwent device generator change out on 05/27/2024. She was last seen on 06/05/2024 by Dr. Kriste.  Per Dr. Kriste Her procedure was on 05/27/24. Her surgery is planned for > 1 mo post op. She can proceed with the procedure from my standpoint.  Therefore, based on ACC/AHA guidelines, the patient would be an acceptable risk for the planned procedure without further cardiovascular testing.   Eliquis  prescribed by a noncardiology provider (Dr. Chiquita) therefore recommendations for holding deferred to prescribing provider.    I will route this recommendation to the requesting party via Epic fax function and remove from pre-op pool. Please call with questions.  Barnie Hila, NP 07/01/2024, 1:01 PM

## 2024-07-01 NOTE — Telephone Encounter (Signed)
 Received fax from [facility] confirming receipt of enrollment form.   [facility] Phone # [phone]

## 2024-07-01 NOTE — Telephone Encounter (Signed)
 Thank you! They had mentioned stopping Eliquis  prior to- can you advise if this is okay? Thanks!

## 2024-07-01 NOTE — Telephone Encounter (Signed)
 Received fax from [facility] stating rx has been transferred to [facility].  Phone #: [phone]

## 2024-07-02 ENCOUNTER — Telehealth: Payer: Self-pay | Admitting: Internal Medicine

## 2024-07-02 NOTE — Telephone Encounter (Signed)
 From pt msg:  I haven't felt well since taking this new med. My pulmonary doctor through Dr Joen study) diagnosed me with pulmonary hypertension. I wanting to know if this diagnosis will change how I am treated/medication with Dr Kriste. I will start taking a med called Tyvsao to lower the pressure in my lungs. Is there a med prescribed by cardiologist that can be eliminated. Dr McClain says that I don't have CHF.

## 2024-07-02 NOTE — Telephone Encounter (Signed)
 Will forward to Dr Kriste for his review and input.

## 2024-07-03 NOTE — Telephone Encounter (Signed)
 I was able to call the patient and give her Dr. Ali response. She understood that she can stop her spironolactone and continue to monitor her blood pressure. I gave her ED precautions and she was receptive to Dr. Ali suggestions.

## 2024-07-05 ENCOUNTER — Ambulatory Visit: Payer: Self-pay | Admitting: Internal Medicine

## 2024-07-05 NOTE — Telephone Encounter (Signed)
 Yes stop eliquis  2d /48h before and for 3d/72h  after eye surgery to be on the safest side

## 2024-07-05 NOTE — Progress Notes (Signed)
 Always with low grade LFT elevation but results are acceptable. AST/ALT < 45. So continue ant fibrotic. Ensure LFT are checked every 1-3 months

## 2024-07-06 ENCOUNTER — Telehealth: Payer: Self-pay | Admitting: *Deleted

## 2024-07-06 ENCOUNTER — Encounter

## 2024-07-06 NOTE — Telephone Encounter (Unsigned)
 Copied from CRM #[id number]. Topic: Clinical - Medication Question >> Jul 06, 2024  1:38 PM Lavanda D wrote: Reason for CRM: Ole with [facility] is calling because they have not yet heard back regarding Ms. Raimer surgical clearance/eliquis . Please fax back response to: [phone].  Copy of encounter 06/30/24 was faxed to [facility] at the number provided above.

## 2024-07-06 NOTE — Telephone Encounter (Signed)
 Pt was cleared on 07/01/24 by Gearline Hila, NP and notes were faxed same day @ 12:58 pm.   I will re-fax the notes again to requesting office

## 2024-07-06 NOTE — Telephone Encounter (Signed)
 Ole from [facility] is calling to follow up on clearance. Please advise.

## 2024-07-07 ENCOUNTER — Ambulatory Visit

## 2024-07-07 DIAGNOSIS — R498 Other voice and resonance disorders: Secondary | ICD-10-CM

## 2024-07-07 NOTE — Patient Instructions (Signed)
 When doing diaphragmatic breathing, think of your lungs in your feet.  When performing voice exercises, think about your breathing and getting your voice forward.

## 2024-07-07 NOTE — Telephone Encounter (Signed)
 Copy of this encounter faxed to [facility].

## 2024-07-07 NOTE — Therapy (Signed)
 OUTPATIENT SPEECH LANGUAGE PATHOLOGY VOICE TREATMENT/RECERTIFICATION   Patient Name: [name] MRN: [id number] DOB:1951/04/21, 73 y.o., female Today's Date: 07/07/2024  PCP: Leonce Carola PARAS PA-C REFERRING PROVIDER: Geronimo Amel, MD  END OF SESSION:  End of Session - 07/07/24 1213     Visit Number 4    Number of Visits 8    Date for Recertification  09/01/24           Past Medical History:  Diagnosis Date   AICD (automatic cardioverter/defibrillator) present    [employer]   Allergy 09/20/1994   Anxiety    Arthritis    Back pain    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   Bronchiectasis (HCC)    Cerebral aneurysm, nonruptured    had a clip put in   CHF (congestive heart failure) (HCC)    Clostridium difficile infection    Depressive disorder, not elsewhere classified    Diverticulosis of colon (without mention of hemorrhage)    Esophageal reflux    Fatty liver    Fibromyalgia    Gastritis    GERD (gastroesophageal reflux disease)    GI bleed 2004   Glaucoma    Hiatal hernia    History of COVID-19 05/04/2021   Hyperlipidemia    Hypertension    Hypothyroidism    Internal hemorrhoids    Joint pain    Multifocal pneumonia 06/03/2021   Obstructive sleep apnea (adult) (pediatric)    Osteoarthritis    Ostium secundum type atrial septal defect    Other chronic nonalcoholic liver disease    Other pulmonary embolism and infarction    Palpitations    Paroxysmal ventricular tachycardia (HCC)    Personal history of radiation therapy    PONV (postoperative nausea and vomiting)    Presence of permanent cardiac pacemaker    PUD (peptic ulcer disease)    Radiation 02/03/15-03/10/15   Right Breast   Sarcoid    per pt , not sure   Schatzki's ring    Shortness of breath    with exertion - has oxygen prn via [place] at 2 L   Sleep apnea    uses CPAP nightly   Stroke [facility]) 2013   tia/ pt feels it was around 2008 0r 2009    Takotsubo syndrome    Tubular adenoma of colon    Unspecified transient cerebral ischemia    Unspecified vitamin D  deficiency    Past Surgical History:  Procedure Laterality Date   ABDOMINAL HYSTERECTOMY     ABI  2006   normal   BONE EXOSTOSIS EXCISION Right 04/02/2024   Procedure: EXCISION, EXOSTOSIS;  Surgeon: Kit Rush, MD;  Location: [facility] OR;  Service: Orthopedics;  Laterality: Right;   BREAST EXCISIONAL BIOPSY     BREAST LUMPECTOMY Right 2016   BREAST LUMPECTOMY WITH RADIOACTIVE SEED LOCALIZATION Right 01/03/2015   Procedure: BREAST LUMPECTOMY WITH RADIOACTIVE SEED LOCALIZATION;  Surgeon: Deward Null III, MD;  Location: [facility] OR;  Service: General;  Laterality: Right;   CARDIAC DEFIBRILLATOR PLACEMENT  2006; 2012   BSX single chamber ICD   CARDIAC DEFIBRILLATOR PLACEMENT  2025   carotid dopplers  2006   neg   CEREBRAL ANEURYSM REPAIR  02/1999   COLONOSCOPY     EYE SURGERY Right 02/2023   surgery center - for cataracts   HAMMER TOE SURGERY  11/15/2020   3rd digit bilateral feet    hospitalization  2004   GI bleed, PUD, diverticulosis (EGD,colonscopy)  hospitalization     PE, NSVT, s/p defib   JOINT REPLACEMENT  05/2021   KNEE ARTHROSCOPY     bilateral   LEFT HEART CATHETERIZATION WITH CORONARY ANGIOGRAM N/A 02/22/2012   Procedure: LEFT HEART CATHETERIZATION WITH CORONARY ANGIOGRAM;  Surgeon: Lonni [name] Cash, MD;  Location: [facility] CATH LAB;  Service: Cardiovascular;  Laterality: N/A;   PARTIAL HYSTERECTOMY     Fibroids   RIGHT HEART CATH N/A 06/18/2024   Procedure: RIGHT HEART CATH;  Surgeon: Rolan Ezra RAMAN, MD;  Location: [facility] INVASIVE CV LAB;  Service: Cardiovascular;  Laterality: N/A;   TONGUE BIOPSY  12/12/2017   due to sore tongue and white patches/abnormal cells   TOTAL KNEE ARTHROPLASTY Left 02/13/2016   Procedure: TOTAL KNEE ARTHROPLASTY;  Surgeon: Marcey Raman, MD;  Location: [facility] OR;  Service: Orthopedics;  Laterality: Left;   TOTAL KNEE ARTHROPLASTY Right  05/22/2021   Procedure: TOTAL KNEE ARTHROPLASTY;  Surgeon: Raman Marcey, MD;  Location: [facility] ORS;  Service: Orthopedics;  Laterality: Right;  with block   TUBAL LIGATION     UPPER GASTROINTESTINAL ENDOSCOPY     Patient Active Problem List   Diagnosis Date Noted   Pulmonary embolism (HCC) 04/03/2023   Acute pulmonary embolism (HCC) 04/02/2023   COVID-19 virus infection 03/12/2023   IPF (idiopathic pulmonary fibrosis) (HCC) 02/25/2023   Bicipital tendinitis of left shoulder 11/09/2022   Partial symptomatic epilepsy with complex partial seizures, not intractable, without status epilepticus (HCC) 08/10/2022   Low back pain 08/10/2022   Gait abnormality 05/04/2022   Muscle spasm 05/04/2022   Upper airway cough syndrome 01/19/2022   Chronic left shoulder pain 11/13/2021   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   Localized swelling of right lower leg 08/29/2021   Tonsillar exudate 08/29/2021   Candida vaginitis 08/28/2021   Chronic pain syndrome 08/25/2021   Multifocal pneumonia 06/03/2021   Pre-operative respiratory examination 05/18/2021   Statin myopathy 04/19/2021   Pain in joint of right shoulder 02/10/2021   Arthritis of both acromioclavicular joints 01/02/2021   Trochanteric bursitis of right hip 10/03/2020   Tailbone injury 05/05/2020   UTI (urinary tract infection) 05/05/2020   Insomnia 05/05/2020   Leg cramps 02/16/2020   Dysuria 02/16/2020   Right hip impingement syndrome 02/01/2020   Right bicipital tenosynovitis 02/01/2020   Bronchiectasis without complication (HCC) 01/22/2020   Myofascial pain 12/21/2019   Estrogen deficiency 12/10/2019   Obesity (BMI 30-39.9) 12/10/2019   Bronchiectasis with (acute) exacerbation (HCC) 10/23/2019   Elevated LFTs 10/15/2019   Lower abdominal pain 09/01/2019   Neck pain 10/25/2017   Paresthesias in left hand 10/25/2017   Paresthesia of foot, bilateral 04/12/2017   Primary osteoarthritis of both hands 09/27/2016   Primary osteoarthritis  of both knees 09/27/2016   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   Nasopharyngitis, 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/2016   S/P total knee replacement 02/13/2016   Chronic pain of both knees 12/06/2015   Globus pharyngeus 05/17/2015   Genetic testing 12/23/2014   Family history of breast cancer    Family history of colon cancer    Family history of pancreatic cancer    History of breast cancer 11/23/2014   Allergy to ACE inhibitors 09/27/2014   Prediabetes 06/23/2013   Muscle spasms of both lower extremities 01/15/2013   Hx of Clostridium difficile infection 10/10/2012   Dyspnea on exertion 04/02/2012   NICM (nonischemic cardiomyopathy) (HCC) 03/07/2012   Post-menopausal 01/11/2012   Atypical chest pain 11/07/2011   Obstructive sleep apnea 04/04/2010   V-tach (HCC) 01/03/2009  ICD  [employer]  Single chamber 01/03/2009   PFO (patent foramen ovale) 09/29/2008   Vitamin D  deficiency 08/17/2008   Hypothyroidism 11/18/2006   Hyperlipidemia 11/18/2006   Depression with anxiety 11/18/2006   Essential hypertension 11/18/2006   Mitral valve prolapse 11/18/2006   Cerebral aneurysm 11/18/2006   Allergic rhinitis 11/18/2006   GERD 11/18/2006   Diverticulosis of colon 11/18/2006   Fatty liver 11/18/2006   Fibromyalgia 11/18/2006    Onset date: 02/18/2024  REFERRING DIAG: G15.887 (ICD-10-CM) - IPF (idiopathic pulmonary fibrosis) (HCC) Z51.81 (ICD-10-CM) - Medication monitoring encounter G47.33 (ICD-10-CM) - OSA (obstructive sleep apnea) R05.3 (ICD-10-CM) - Chronic cough Z91.09 (ICD-10-CM) - House dust mite allergy R09.89 (ICD-10-CM) - Chronic throat clearing Z86.711 (ICD-10-CM) - History of pulmonary embolism  THERAPY DIAG:  Other voice and resonance disorders  Rationale for Evaluation and Treatment: Rehabilitation  SUBJECTIVE:   SUBJECTIVE STATEMENT: Yes, my voice problem upsets me. Pt accompanied by: self  PERTINENT HISTORY: Dickey Roseann [place] 73 y.o. -returns for follow-up.  She  says she is feeling stable but she is mostly bothered by chronic cough with clearing of the throat for the last 2 months.  She states that when she is out of the house the cough is less.  She does have dust mite allergy and she says she is taking all the precautions but nevertheless when she is out of the house the cough is less. She is also clearing the throat a lot.  She is asking for interventions.  But from a shortness of breath perspective she is stable.  She is tolerating Esbriet  well.  She had pulmonary function testing that shows a continued steady decline in DLCO  PAIN:  Are you having pain? Yes: NPRS scale: 8/10 Pain location: back Pain description: ache, pain Aggravating factors: laying on stomach Relieving factors: meds  FALLS: Has patient fallen in last 6 months? No, Number of falls: 0  LIVING ENVIRONMENT: Lives with: lives alone Lives in: House/apartment  PLOF:Level of assistance: Independent with ADLs, Independent with IADLs Employment: Retired  PATIENT GOALS:   OBJECTIVE:  Note: Objective measures were completed at Evaluation unless otherwise noted.   COGNITION: Overall cognitive status: Within functional limits for tasks assessed   SOCIAL HISTORY: Occupation: Retired Water  intake: optimal Caffeine/alcohol  intake: minimal Daily voice use: minimal  PERCEPTUAL VOICE ASSESSMENT: GRBAS (Completed on 07/07/24) General- 1 Roughness - 2 Breathiness - 1 A - 1 S - 1 Voice quality: hoarse and low vocal intensity Vocal abuse: habitual throat clearing - (Has improved)  Resonance: normal Respiratory function: thoracic breathing  OBJECTIVE VOICE ASSESSMENT: Maximum phonation time for sustained ah: 9.46 Conversational loudness average: 66 dB Conversational loudness range: 62-68 dB (70dB is WNL) S/z ratio: 1.05 (Suggestive of dysfunction >1.4)   PATIENT REPORTED OUTCOME MEASURES (PROM): V-RQOL: 22 - she rated a 3 or moderate problem: speaking loudly and being heard,  feeling frustrated due to her voice, having to repeat her self to be understood and rated a 2 for not knowing what will come out when she starts speaking.  and VHI: 14/40  TREATMENT DATE:   07/07/24: Pt noted that her coughing has improved in the past few weeks. Pt feels good about managing her chronic cough with cough suppression strategies outside of sessions. Pt states that her dysphonia is her main concern for now. SLP completed VHI-10 with pt scoring 14/40. SLP assessed pt's vocal quality using GRBAS; pt presents with a mild dysphonia characterized by increased roughness and voice use on residual capacity. Pt may benefit from a referral to ENT for objective assessment of vocal fold anatomy/physiology. SLP introduced resonant voice therapy (RVT) for addressing dysphonia. SLP educated pt about parts of voice (breathing, phonation, artic/resonance) and how they relate to RVT. Pt completed RVT at syllable level with /b, v, and m/ with 80% consistency when given occasional max to mod A. Pt performed RVT at sentence level with /m/ words with 70% consistency when provided frequent mod A. SLP reviewed SOVTEs (tongue trill) to balance phonation. Pt performed SOVTEs with /r/ words with 80% consistency when given frequent mod A.    06/22/24: Loreli has been out of town for 4 weeks - She enters with hoarse voice and low volume. She has carried over alginate at night and feels it is helping. She has carried over cough suppression techniques successfully - rescues breathing and swallow - she reports cough is 50% reduced at least (subjectively). She has not completed her voice exercises since her last visit. Voice is hoarse - HEP for voice (PhoRTE) - with occasional min A to correct hoarse voice - sustained Ha with clear voice 10/10x; pitch glides with clear voice 8/10x; 10 sentences in high and low  pitch with intermittent hoarseness at high pitch. Initiated training in SOVTE (semi-occluded vocal tract exercises) in water  with rare min A after initial modeling and instruction. Adelie sees pulmonologist this week. She initially denied difficulty swallowing but now endorses swallowing to be a trigger for cough - will discuss this next visit, trigger for cough vs dysphagia  04/13/24: Makaria enters with low volume, (65dB) hoarse voice. She reports coughing episode night before last as she did not have C PAP on. Coughing during the day reduced - she continues on pregabalin  1x a day. Cassy reports she has been reducing throat clearing and improving her awareness of throat clearing behavior. She is successfully swallowing instead of clearing her throat. Today, 1 throat clear with verbal cues to ID the throat clear. Targeted HEP for voice as clear voice achieve with high intensity vocal exercises - sustained Ha with 80-85dB for 5 seconds resulted in clear phonation 10/10 trial and occasional modeling and verbal cues. Pitch glides up and down, again starting using ha with clear phonation and usual min modeling, gestures and verbal cues for volume and to sustain high and low pitches. Reading in high and low pitch with increased vocal intensity with occasional modeling and verbal cues. Targeted increased vocal intensity generating 3 sentence descriptions with consistent modeling reduced hoarse voice to 40% of words uttered. Yahayra feels too loud when she achieves WNL volume. She required consistent verbal cues and imitating to ID when she speaks on residual volume and in low glottal fry.   04/06/24: Eval completed - initiated training in throat clear awareness and throat clear avoidance. Initially she required consistent mod A to ID throat clearing behavior, which improved to occasional min verbal and visual cues (tally Craker). After initial training, Maygan demonstrated throat clear suppression with hard swallow,  sniffs and/or sip 3/5x with usual mod A. Initiated training in cough suppression using  gentle, relaxed sniff-sniff blow pattern and intermittent swallows or sips. She did not have coughing episode this session, however she demonstrated these strategies with rare min A in isolation as practice. Education re: irritable larynx, LPR and post nasal drip provided   PATIENT EDUCATION: Education details: See Treatment, See patient instructions, reflux education, strategies to suppress cough and throat clear, vocal hygiene Person educated: Patient Education method: Explanation, Demonstration, Verbal cues, and Handouts Education comprehension: verbalized understanding, verbal cues required, and needs further education  HOME EXERCISE PROGRAM: In progress- Home exercises for diaphragmatic breathing an RVT were introduced during this session to optimize voice outcomes.  GOALS: Goals reviewed with patient? Yes   LONG TERM GOALS: Original Target date: 06/29/24 - New target date: 09/01/2024  Pt will clear her throat 2 or less times over 2 sessions Baseline:  Goal status: MET  2.  Pt will complete HEP for voice with mod I Baseline:  Goal status: ONGOING  3.  Pt will use strategies to suppress or prevent cough with occasional min A over 2 sessions Baseline:  Goal status: MET  4.  Pt will report 50% reduction in coughing subjectively Baseline:  Goal status: MET  5.  Pt will achieve clear phonation and WNL volume 18/20 sentences Baseline:  Goal status: DISCONTINUE   6.  Pt will maintain WNL volume and clear phonation over 8 minute conversation with rare min A Baseline:  Goal status: ONGOING  7. Pt will utilize diaphragmatic breathing during conversation during  80% of opportunities when given rare min A.  Baseline: Pt uses thoracic breathing Goal status: NEW  8. Pt will report subjective improvements with voice according to VHI-10  Baseline: 14/40 Goal status: NEW  SHORT TERM GOALS:   Target date: 08/04/24  Pt will achieve clear phonation during sentence readings with 80% consistency given occasional min A. Baseline: N/A Goal status: NEW  2.   Pt will utilize diaphragmatic breathing during structured voice exercises in 80% of opportunities with occasional min A.  Baseline: Pt uses thoracic breathing Goal status: NEW  3. Pt adhere to voice HEP across 2 sessions. Baseline: N/A Goal status: NEW  4. Pt will use voice strategies during conversation in 4 minute segments with frequent min A.  Baseline: N/A Goal status: NEW  ASSESSMENT:  CLINICAL IMPRESSION: (Check Tx note on 07/07/24 for updated clinical impression) Patient is a 73 y.o. female who was seen today for chronic cough and hoarse voice. She reports voice has been hoarse for about 2 months. She is referred by pulmonologist for chronic cough and irritable larynx. She identifies perfume, scents, laughing, post nasal drip and smoke as triggers for her cough. She reports the cough in uncontrollable at times and garners unwanted attention. Others ask if she is sick or needs water . She has stopped reading at church due to voice changes and is unable to sing due to voice changes. She is on PPI in am and H2 blocker pm for GERD as well as  flonase  for nasal drip and allergies. She consistently clears her throat reporting globus sensation. She reports she has to repeat herself frequently due to voice changes. Her conversation volume is low (66dB) as well. Trials of high intensity voice exercises did result in improved voice quality indicating she is a good candidate for speech therapy. I recommend skilled ST to reduce sx of irritable larynx and improve intelligibility for safety, QOL.  OBJECTIVE IMPAIRMENTS: include voice disorder. These impairments are limiting patient from effectively communicating at home and  in community. Factors affecting potential to achieve goals and functional outcome are n/a.SABRA Patient will benefit from  skilled SLP services to address above impairments and improve overall function.  REHAB POTENTIAL: Good  PLAN:  SLP FREQUENCY: 1-2x/week  SLP DURATION: 12 weeks  PLANNED INTERVENTIONS: Diet toleration management , Environmental controls, Internal/external aids, Functional tasks, Multimodal communication approach, SLP instruction and feedback, Compensatory strategies, Patient/family education, [phone] Treatment of speech (30 or 45 min) , and [id number]- Speech Eval Behavioral Qualitative Voice Resonance    W[employer]ell [employer], CF-SLP 07/07/2024, 12:50 PM

## 2024-07-08 LAB — BASIC METABOLIC PANEL WITH GFR
BUN/Creatinine Ratio: 10 — ABNORMAL LOW (ref 12–28)
BUN: 9 mg/dL (ref 8–27)
CO2: 24 mmol/L (ref 20–29)
Calcium: 9.7 mg/dL (ref 8.7–10.3)
Chloride: 106 mmol/L (ref 96–106)
Creatinine, Ser: 0.88 mg/dL (ref 0.57–1.00)
Glucose: 96 mg/dL (ref 70–99)
Potassium: 4.5 mmol/L (ref 3.5–5.2)
Sodium: 143 mmol/L (ref 134–144)
eGFR: 68 mL/min/1.73 (ref >59)

## 2024-07-09 ENCOUNTER — Other Ambulatory Visit: Payer: Self-pay

## 2024-07-12 ENCOUNTER — Other Ambulatory Visit: Payer: Self-pay | Admitting: Neurology

## 2024-07-13 ENCOUNTER — Encounter

## 2024-07-13 ENCOUNTER — Ambulatory Visit

## 2024-07-13 DIAGNOSIS — R498 Other voice and resonance disorders: Secondary | ICD-10-CM | POA: Diagnosis not present

## 2024-07-13 NOTE — Telephone Encounter (Signed)
 Last seen on 09/11/23 per note  olerating Onfi  10 mg twice daily, no recent seizures, will continue.  Follow up scheduled on 09/10/24   Dispensed Days Supply Quantity Provider Pharmacy  [facility]  10MG        TAB 06/13/2024 30 60 each Gayland Lauraine PARAS, NP [facility]...      Rx pending to be signed

## 2024-07-13 NOTE — Therapy (Signed)
 OUTPATIENT SPEECH LANGUAGE PATHOLOGY VOICE TREATMENT   Patient Name: [name] MRN: [id number] DOB:Jul 01, 1951, 73 y.o., female Today's Date: 07/13/2024  PCP: Leonce Carola PARAS PA-C REFERRING PROVIDER: Geronimo Amel, MD  END OF SESSION:  End of Session - 07/13/24 1500     Visit Number 5    Number of Visits 8    Date for Recertification  09/01/24    SLP Start Time 1400    SLP Stop Time  1447    SLP Time Calculation (min) 47 min    Activity Tolerance Patient tolerated treatment well            Past Medical History:  Diagnosis Date   AICD (automatic cardioverter/defibrillator) present    [employer]   Allergy 09/20/1994   Anxiety    Arthritis    Back pain    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   Bronchiectasis (HCC)    Cerebral aneurysm, nonruptured    had a clip put in   CHF (congestive heart failure) (HCC)    Clostridium difficile infection    Depressive disorder, not elsewhere classified    Diverticulosis of colon (without mention of hemorrhage)    Esophageal reflux    Fatty liver    Fibromyalgia    Gastritis    GERD (gastroesophageal reflux disease)    GI bleed 2004   Glaucoma    Hiatal hernia    History of COVID-19 05/04/2021   Hyperlipidemia    Hypertension    Hypothyroidism    Internal hemorrhoids    Joint pain    Multifocal pneumonia 06/03/2021   Obstructive sleep apnea (adult) (pediatric)    Osteoarthritis    Ostium secundum type atrial septal defect    Other chronic nonalcoholic liver disease    Other pulmonary embolism and infarction    Palpitations    Paroxysmal ventricular tachycardia (HCC)    Personal history of radiation therapy    PONV (postoperative nausea and vomiting)    Presence of permanent cardiac pacemaker    PUD (peptic ulcer disease)    Radiation 02/03/15-03/10/15   Right Breast   Sarcoid    per pt , not sure   Schatzki's ring    Shortness of breath    with exertion - has oxygen  prn via [place] at 2 L   Sleep apnea    uses CPAP nightly   Stroke [facility]) 2013   tia/ pt feels it was around 2008 0r 2009   Takotsubo syndrome    Tubular adenoma of colon    Unspecified transient cerebral ischemia    Unspecified vitamin D  deficiency    Past Surgical History:  Procedure Laterality Date   ABDOMINAL HYSTERECTOMY     ABI  2006   normal   BONE EXOSTOSIS EXCISION Right 04/02/2024   Procedure: EXCISION, EXOSTOSIS;  Surgeon: Kit Rush, MD;  Location: [facility] OR;  Service: Orthopedics;  Laterality: Right;   BREAST EXCISIONAL BIOPSY     BREAST LUMPECTOMY Right 2016   BREAST LUMPECTOMY WITH RADIOACTIVE SEED LOCALIZATION Right 01/03/2015   Procedure: BREAST LUMPECTOMY WITH RADIOACTIVE SEED LOCALIZATION;  Surgeon: Deward Null III, MD;  Location: [facility] OR;  Service: General;  Laterality: Right;   CARDIAC DEFIBRILLATOR PLACEMENT  2006; 2012   BSX single chamber ICD   CARDIAC DEFIBRILLATOR PLACEMENT  2025   carotid dopplers  2006   neg   CEREBRAL ANEURYSM REPAIR  02/1999   COLONOSCOPY     EYE SURGERY Right  02/2023   surgery center - for cataracts   HAMMER TOE SURGERY  11/15/2020   3rd digit bilateral feet    hospitalization  2004   GI bleed, PUD, diverticulosis (EGD,colonscopy)   hospitalization     PE, NSVT, s/p defib   JOINT REPLACEMENT  05/2021   KNEE ARTHROSCOPY     bilateral   LEFT HEART CATHETERIZATION WITH CORONARY ANGIOGRAM N/A 02/22/2012   Procedure: LEFT HEART CATHETERIZATION WITH CORONARY ANGIOGRAM;  Surgeon: Lonni [name] Cash, MD;  Location: [facility] CATH LAB;  Service: Cardiovascular;  Laterality: N/A;   PARTIAL HYSTERECTOMY     Fibroids   RIGHT HEART CATH N/A 06/18/2024   Procedure: RIGHT HEART CATH;  Surgeon: Rolan Ezra RAMAN, MD;  Location: [facility] INVASIVE CV LAB;  Service: Cardiovascular;  Laterality: N/A;   TONGUE BIOPSY  12/12/2017   due to sore tongue and white patches/abnormal cells   TOTAL KNEE ARTHROPLASTY Left 02/13/2016   Procedure: TOTAL KNEE ARTHROPLASTY;   Surgeon: Marcey Raman, MD;  Location: [facility] OR;  Service: Orthopedics;  Laterality: Left;   TOTAL KNEE ARTHROPLASTY Right 05/22/2021   Procedure: TOTAL KNEE ARTHROPLASTY;  Surgeon: Raman Marcey, MD;  Location: [facility] ORS;  Service: Orthopedics;  Laterality: Right;  with block   TUBAL LIGATION     UPPER GASTROINTESTINAL ENDOSCOPY     Patient Active Problem List   Diagnosis Date Noted   Pulmonary embolism (HCC) 04/03/2023   Acute pulmonary embolism (HCC) 04/02/2023   COVID-19 virus infection 03/12/2023   IPF (idiopathic pulmonary fibrosis) (HCC) 02/25/2023   Bicipital tendinitis of left shoulder 11/09/2022   Partial symptomatic epilepsy with complex partial seizures, not intractable, without status epilepticus (HCC) 08/10/2022   Low back pain 08/10/2022   Gait abnormality 05/04/2022   Muscle spasm 05/04/2022   Upper airway cough syndrome 01/19/2022   Chronic left shoulder pain 11/13/2021   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   Localized swelling of right lower leg 08/29/2021   Tonsillar exudate 08/29/2021   Candida vaginitis 08/28/2021   Chronic pain syndrome 08/25/2021   Multifocal pneumonia 06/03/2021   Pre-operative respiratory examination 05/18/2021   Statin myopathy 04/19/2021   Pain in joint of right shoulder 02/10/2021   Arthritis of both acromioclavicular joints 01/02/2021   Trochanteric bursitis of right hip 10/03/2020   Tailbone injury 05/05/2020   UTI (urinary tract infection) 05/05/2020   Insomnia 05/05/2020   Leg cramps 02/16/2020   Dysuria 02/16/2020   Right hip impingement syndrome 02/01/2020   Right bicipital tenosynovitis 02/01/2020   Bronchiectasis without complication (HCC) 01/22/2020   Myofascial pain 12/21/2019   Estrogen deficiency 12/10/2019   Obesity (BMI 30-39.9) 12/10/2019   Bronchiectasis with (acute) exacerbation (HCC) 10/23/2019   Elevated LFTs 10/15/2019   Lower abdominal pain 09/01/2019   Neck pain 10/25/2017   Paresthesias in left hand 10/25/2017    Paresthesia of foot, bilateral 04/12/2017   Primary osteoarthritis of both hands 09/27/2016   Primary osteoarthritis of both knees 09/27/2016   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   Nasopharyngitis, 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/2016   S/P total knee replacement 02/13/2016   Chronic pain of both knees 12/06/2015   Globus pharyngeus 05/17/2015   Genetic testing 12/23/2014   Family history of breast cancer    Family history of colon cancer    Family history of pancreatic cancer    History of breast cancer 11/23/2014   Allergy to ACE inhibitors 09/27/2014   Prediabetes 06/23/2013   Muscle spasms of both lower extremities 01/15/2013   Hx of Clostridium difficile infection 10/10/2012  Dyspnea on exertion 04/02/2012   NICM (nonischemic cardiomyopathy) (HCC) 03/07/2012   Post-menopausal 01/11/2012   Atypical chest pain 11/07/2011   Obstructive sleep apnea 04/04/2010   V-tach (HCC) 01/03/2009   ICD  [employer]  Single chamber 01/03/2009   PFO (patent foramen ovale) 09/29/2008   Vitamin D  deficiency 08/17/2008   Hypothyroidism 11/18/2006   Hyperlipidemia 11/18/2006   Depression with anxiety 11/18/2006   Essential hypertension 11/18/2006   Mitral valve prolapse 11/18/2006   Cerebral aneurysm 11/18/2006   Allergic rhinitis 11/18/2006   GERD 11/18/2006   Diverticulosis of colon 11/18/2006   Fatty liver 11/18/2006   Fibromyalgia 11/18/2006    Onset date: 02/18/2024  REFERRING DIAG: G15.887 (ICD-10-CM) - IPF (idiopathic pulmonary fibrosis) (HCC) Z51.81 (ICD-10-CM) - Medication monitoring encounter G47.33 (ICD-10-CM) - OSA (obstructive sleep apnea) R05.3 (ICD-10-CM) - Chronic cough Z91.09 (ICD-10-CM) - House dust mite allergy R09.89 (ICD-10-CM) - Chronic throat clearing Z86.711 (ICD-10-CM) - History of pulmonary embolism  THERAPY DIAG:  Other voice and resonance disorders  Rationale for Evaluation and Treatment: Rehabilitation  SUBJECTIVE:   SUBJECTIVE STATEMENT: Yes, my voice  problem upsets me. Pt accompanied by: self  PERTINENT HISTORY: Dickey Roseann [place] 73 y.o. -returns for follow-up.  She says she is feeling stable but she is mostly bothered by chronic cough with clearing of the throat for the last 2 months.  She states that when she is out of the house the cough is less.  She does have dust mite allergy and she says she is taking all the precautions but nevertheless when she is out of the house the cough is less. She is also clearing the throat a lot.  She is asking for interventions.  But from a shortness of breath perspective she is stable.  She is tolerating Esbriet  well.  She had pulmonary function testing that shows a continued steady decline in DLCO  PAIN:  Are you having pain? Yes: NPRS scale: 8/10 Pain location: back Pain description: ache, pain Aggravating factors: laying on stomach Relieving factors: meds  FALLS: Has patient fallen in last 6 months? No, Number of falls: 0  LIVING ENVIRONMENT: Lives with: lives alone Lives in: House/apartment  PLOF:Level of assistance: Independent with ADLs, Independent with IADLs Employment: Retired  PATIENT GOALS:   OBJECTIVE:  Note: Objective measures were completed at Evaluation unless otherwise noted.   COGNITION: Overall cognitive status: Within functional limits for tasks assessed   SOCIAL HISTORY: Occupation: Retired Water  intake: optimal Caffeine/alcohol  intake: minimal Daily voice use: minimal  PERCEPTUAL VOICE ASSESSMENT: GRBAS (Completed on 07/07/24) General- 1 Roughness - 2 Breathiness - 1 A - 1 S - 1 Voice quality: hoarse and low vocal intensity Vocal abuse: habitual throat clearing - (Has improved)  Resonance: normal Respiratory function: thoracic breathing  OBJECTIVE VOICE ASSESSMENT: Maximum phonation time for sustained ah: 9.46 Conversational loudness average: 66 dB Conversational loudness range: 62-68 dB (70dB is WNL) S/z ratio: 1.05 (Suggestive of dysfunction >1.4)    PATIENT REPORTED OUTCOME MEASURES (PROM): V-RQOL: 22 - she rated a 3 or moderate problem: speaking loudly and being heard, feeling frustrated due to her voice, having to repeat her self to be understood and rated a 2 for not knowing what will come out when she starts speaking.  and VHI: 14/40  TREATMENT DATE:   07/13/24: Pt noted that her voice feels slightly better this week compared to previous session. Pt states that her throat clearing has reduced significantly due to working on her voice. Pt is practicing diaphragmatic breathing at home; she notices that she feels better when she breathes diaphragmatically. Pt demonstrated her diaphragmatic breathing independently. Pt utilized diaphragmatic breathing during structured voice exercises and conversation with 85% consistency given frequent min A. SLP reviewed resonant voice therapy (RVT) to optimize pt's forward resonance for balancing phonation. Pt performed RVT with /b/, /v/, /m/, and /k/ words with 85% consistency given frequent min A. SLP challenged pt to say /m/ and /k/ sentences to increase forward resonance for voice; pt was 80% consistent with achieving forward resonance for balanced phonation given frequent min A. SLP introduced conversation therapy training (CTT) to integrate RVT and diaphragmatic breathing into conversation level. SLP focused on establishing forward-resonance/kinesthetic awareness. Pt demonstrated voice self-awareness in 80% of opportunities when given frequent min A. Pt utilized forward resonance and diaphragmatic during structured conversation with 65% consistency to reduce laryngeal hyperfunction given frequent mod to min A. Pt is demonstrating improvement with self-awarness for making adjustments to voice during conversation and structured exercises. SLP provided pt HEP focused on RVT word production to  enhance tx outcomes. Plan is to continue structured voice exercises and CTT in upcoming session.   07/07/24: Pt noted that her coughing has improved in the past few weeks. Pt feels good about managing her chronic cough with cough suppression strategies outside of sessions. Pt states that her dysphonia is her main concern for now. SLP completed VHI-10 with pt scoring 14/40. SLP assessed pt's vocal quality using GRBAS; pt presents with a mild dysphonia characterized by increased roughness and voice use on residual capacity. Pt may benefit from a referral to ENT for objective assessment of vocal fold anatomy/physiology. SLP introduced resonant voice therapy (RVT) for addressing dysphonia. SLP educated pt about parts of voice (breathing, phonation, artic/resonance) and how they relate to RVT. Pt completed RVT at syllable level with /b, v, and m/ with 80% consistency when given occasional max to mod A. Pt performed RVT at sentence level with /m/ words with 70% consistency when provided frequent mod A. SLP reviewed SOVTEs (tongue trill) to balance phonation. Pt performed SOVTEs with /r/ words with 80% consistency when given frequent mod A.    06/22/24: Derrian has been out of town for 4 weeks - She enters with hoarse voice and low volume. She has carried over alginate at night and feels it is helping. She has carried over cough suppression techniques successfully - rescues breathing and swallow - she reports cough is 50% reduced at least (subjectively). She has not completed her voice exercises since her last visit. Voice is hoarse - HEP for voice (PhoRTE) - with occasional min A to correct hoarse voice - sustained Ha with clear voice 10/10x; pitch glides with clear voice 8/10x; 10 sentences in high and low pitch with intermittent hoarseness at high pitch. Initiated training in SOVTE (semi-occluded vocal tract exercises) in water  with rare min A after initial modeling and instruction. Noela sees pulmonologist this week.  She initially denied difficulty swallowing but now endorses swallowing to be a trigger for cough - will discuss this next visit, trigger for cough vs dysphagia  04/13/24: Shariah enters with low volume, (65dB) hoarse voice. She reports coughing episode night before last as she did not have C PAP on. Coughing during the day reduced - she continues on  pregabalin  1x a day. Runa reports she has been reducing throat clearing and improving her awareness of throat clearing behavior. She is successfully swallowing instead of clearing her throat. Today, 1 throat clear with verbal cues to ID the throat clear. Targeted HEP for voice as clear voice achieve with high intensity vocal exercises - sustained Ha with 80-85dB for 5 seconds resulted in clear phonation 10/10 trial and occasional modeling and verbal cues. Pitch glides up and down, again starting using ha with clear phonation and usual min modeling, gestures and verbal cues for volume and to sustain high and low pitches. Reading in high and low pitch with increased vocal intensity with occasional modeling and verbal cues. Targeted increased vocal intensity generating 3 sentence descriptions with consistent modeling reduced hoarse voice to 40% of words uttered. Sharra feels too loud when she achieves WNL volume. She required consistent verbal cues and imitating to ID when she speaks on residual volume and in low glottal fry.   04/06/24: Eval completed - initiated training in throat clear awareness and throat clear avoidance. Initially she required consistent mod A to ID throat clearing behavior, which improved to occasional min verbal and visual cues (tally Orth). After initial training, Sharnae demonstrated throat clear suppression with hard swallow, sniffs and/or sip 3/5x with usual mod A. Initiated training in cough suppression using gentle, relaxed sniff-sniff blow pattern and intermittent swallows or sips. She did not have coughing episode this session, however she  demonstrated these strategies with rare min A in isolation as practice. Education re: irritable larynx, LPR and post nasal drip provided   PATIENT EDUCATION: Education details: See Treatment, See patient instructions, reflux education, strategies to suppress cough and throat clear, vocal hygiene Person educated: Patient Education method: Explanation, Demonstration, Verbal cues, and Handouts Education comprehension: verbalized understanding, verbal cues required, and needs further education  HOME EXERCISE PROGRAM: In progress- Home exercises for diaphragmatic breathing an RVT were introduced during this session to optimize voice outcomes.  GOALS: Goals reviewed with patient? Yes   LONG TERM GOALS: Original Target date: 06/29/24 - New target date: 09/01/2024  Pt will clear her throat 2 or less times over 2 sessions Baseline:  Goal status: MET  2.  Pt will complete HEP for voice with mod I Baseline:  Goal status: ONGOING  3.  Pt will use strategies to suppress or prevent cough with occasional min A over 2 sessions Baseline:  Goal status: MET  4.  Pt will report 50% reduction in coughing subjectively Baseline:  Goal status: MET  5.  Pt will achieve clear phonation and WNL volume 18/20 sentences Baseline:  Goal status: DISCONTINUE   6.  Pt will maintain WNL volume and clear phonation over 8 minute conversation with rare min A Baseline:  Goal status: ONGOING  7. Pt will utilize diaphragmatic breathing during conversation during  80% of opportunities when given rare min A.  Baseline: Pt uses thoracic breathing Goal status: ONGOING  8. Pt will report subjective improvements with voice according to VHI-10  Baseline: 14/40 Goal status: ONGOING  SHORT TERM GOALS:  Target date: 08/04/24  Pt will achieve clear phonation during sentence readings with 80% consistency given occasional min A. Baseline: N/A Goal status: ONGOING  2.   Pt will utilize diaphragmatic breathing  during structured voice exercises in 80% of opportunities with occasional min A.  Baseline: Pt uses thoracic breathing Goal status: ONGOING  3. Pt adhere to voice HEP across 2 sessions. Baseline: N/A Goal status: ONGOING  4.  Pt will use voice strategies during conversation in 4 minute segments with frequent min A.  Baseline: N/A Goal status: ONGOING  ASSESSMENT:  CLINICAL IMPRESSION: (Check Tx note on 07/07/24 for updated clinical impression) Patient is a 73 y.o. female who was seen today for chronic cough and hoarse voice. She reports voice has been hoarse for about 2 months. She is referred by pulmonologist for chronic cough and irritable larynx. She identifies perfume, scents, laughing, post nasal drip and smoke as triggers for her cough. She reports the cough in uncontrollable at times and garners unwanted attention. Others ask if she is sick or needs water . She has stopped reading at church due to voice changes and is unable to sing due to voice changes. She is on PPI in am and H2 blocker pm for GERD as well as  flonase  for nasal drip and allergies. She consistently clears her throat reporting globus sensation. She reports she has to repeat herself frequently due to voice changes. Her conversation volume is low (66dB) as well. Trials of high intensity voice exercises did result in improved voice quality indicating she is a good candidate for speech therapy. I recommend skilled ST to reduce sx of irritable larynx and improve intelligibility for safety, QOL.  OBJECTIVE IMPAIRMENTS: include voice disorder. These impairments are limiting patient from effectively communicating at home and in community. Factors affecting potential to achieve goals and functional outcome are n/a.SABRA Patient will benefit from skilled SLP services to address above impairments and improve overall function.  REHAB POTENTIAL: Good  PLAN:  SLP FREQUENCY: 1-2x/week  SLP DURATION: 12 weeks  PLANNED INTERVENTIONS:  Diet toleration management , Environmental controls, Internal/external aids, Functional tasks, Multimodal communication approach, SLP instruction and feedback, Compensatory strategies, Patient/family education, [phone] Treatment of speech (30 or 45 min) , and [id number]- Speech Eval Behavioral Qualitative Voice Resonance    [employer], CF-SLP 07/13/2024, 3:01 PM

## 2024-07-14 ENCOUNTER — Other Ambulatory Visit: Payer: Self-pay

## 2024-07-15 NOTE — Research (Signed)
 TItle: "PHINDER: Pulmonary Hypertension Screening in Patients with Interstitial Lung Disease for Earlier Detection"   Primary Endpoint: To prospectively evaluate screening strategies for pulmonary hypertension (PH) in patients with ILD in an effort to promote awareness and encourage screening for PH in this patient population. Results from this study will be used to identify and weigh specific clinical parameters based on their prognostic significance for right heart catheterization (RHC)-confirmed PH in patients with ILD.   Study Code: [id number]   CRC checked for correct versions: Yes    Protocol Version: Amendment 2 dated 30May2025 IB: N/A ICF: [facility] IR[employer] Approved Version 05 Feb 2024, Revised 18Jun2025 Lab Manual: v1.0 dated 21Apr2023   Key Inclusion Criteria:  Patients are eligible if they have a diagnosis of ILD based on computed tomography imaging, including:  Idiopathic interstitial pneumonia, including idiopathic pulmonary fibrosis  Connective tissue disease-associated ILD with forced vital capacity (FVC) <70%  Hypersensitivity pneumonitis  Scleroderma-related ILD  Autoimmune ILD  Nonspecific interstitial pneumonia  Occupational lung disease  Combined pulmonary fibrosis and emphysema   Additionally, patients must meet a total of 2 or more criteria from at least 2 distinct categories below (ie, Category 1, Category 2, Category 3) based on the Investigator's clinical judgment, within 180 days of Screening.   Category 1: Clinical tests: a) Pulmonary function tests (PFTs)  meeting specified study guidelines for DLCO. b) Historical HRCT with specified findings consistent with increased pulmonary arterial pressure.   Category 2: Symptoms, oxygenation, exercise capacity, and brain natriuretic peptide/N-terminal pro-brain natriuretic peptide indicative of worsening status.   Category 3: Echocardiography Specified findings of increased RV systolic pressure or RV enlargement.   If  a patient does not have a historical PFT, HRCT, or echocardiogram needed to assess eligibility for the study, these procedures will be performed as part of study screening.   Key Exclusion Criteria:  Patients are excluded if any of the following criteria apply:  Prior RHC with mean pulmonary artery pressure >20 mmHg  Currently on an FDA-approved pulmonary arterial hypertension medication  Diagnosed with chronic obstructive pulmonary disease  Uncontrolled or untreated sleep apnea  Pulmonary embolism within the past 3 months  History of ischemic heart disease or left-sided myocardial dysfunction within 12 months of Screening, defined as left ventricular ejection fraction <40% or pulmonary capillary wedge pressure >15 mmHg   Safety Data: Not Applicable.     [facility] [place] Clinical Research Coordinator note:    This visit for Subject [name] with DOB: [birth year] on 06/05/2024 for the above protocol is Encounter # 2 and is for purpose of research.  The Subject ID for the study is [id number]   Subject expressed continued interest and consent in continuing as a study subject. Subject confirmed that there was no change in contact information (e.g. address, telephone, email). Subject thanked for participation in research and contribution to science. In this visit 06/05/2024 the subject will be evaluated by Investigator named Dr. Donnice Beals.     The Subject was informed that the PI Dr. Adina Beals continues to have oversight of the subject's visits and course through relevant discussions, reviews, and also specifically of this visit by routing of this note to the PI.   All questions and concerns were addressed and answered during the visit. All applicable screening visit assessments were completed. Please see subject binder for further details.   Signed by   Octaviano Paras Clinical Research Coordinator [facility]  [place], [place]

## 2024-07-17 ENCOUNTER — Other Ambulatory Visit (HOSPITAL_COMMUNITY): Payer: Self-pay

## 2024-07-21 ENCOUNTER — Ambulatory Visit: Attending: Internal Medicine

## 2024-07-21 DIAGNOSIS — R498 Other voice and resonance disorders: Secondary | ICD-10-CM | POA: Insufficient documentation

## 2024-07-21 NOTE — Patient Instructions (Signed)
 Forward does not mean higher.  Use a comfortable pitch.

## 2024-07-21 NOTE — Therapy (Unsigned)
 OUTPATIENT SPEECH LANGUAGE PATHOLOGY VOICE TREATMENT   Patient Name: [name] MRN: [id number] DOB:Mar 20, 1951, 73 y.o., female Today's Date: 07/21/2024  PCP: Leonce Carola PARAS PA-C REFERRING PROVIDER: Geronimo Amel, MD  END OF SESSION:  End of Session - 07/21/24 1318     Visit Number 6    Number of Visits 8    Date for Recertification  09/01/24    SLP Start Time 1231    SLP Stop Time  1317    SLP Time Calculation (min) 46 min    Activity Tolerance Patient tolerated treatment well             Past Medical History:  Diagnosis Date   AICD (automatic cardioverter/defibrillator) present    [employer]   Allergy 09/20/1994   Anxiety    Arthritis    Back pain    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   Bronchiectasis (HCC)    Cerebral aneurysm, nonruptured    had a clip put in   CHF (congestive heart failure) (HCC)    Clostridium difficile infection    Depressive disorder, not elsewhere classified    Diverticulosis of colon (without mention of hemorrhage)    Esophageal reflux    Fatty liver    Fibromyalgia    Gastritis    GERD (gastroesophageal reflux disease)    GI bleed 2004   Glaucoma    Hiatal hernia    History of COVID-19 05/04/2021   Hyperlipidemia    Hypertension    Hypothyroidism    Internal hemorrhoids    Joint pain    Multifocal pneumonia 06/03/2021   Obstructive sleep apnea (adult) (pediatric)    Osteoarthritis    Ostium secundum type atrial septal defect    Other chronic nonalcoholic liver disease    Other pulmonary embolism and infarction    Palpitations    Paroxysmal ventricular tachycardia (HCC)    Personal history of radiation therapy    PONV (postoperative nausea and vomiting)    Presence of permanent cardiac pacemaker    PUD (peptic ulcer disease)    Radiation 02/03/15-03/10/15   Right Breast   Sarcoid    per pt , not sure   Schatzki's ring    Shortness of breath    with exertion - has  oxygen prn via [place] at 2 L   Sleep apnea    uses CPAP nightly   Stroke [facility]) 2013   tia/ pt feels it was around 2008 0r 2009   Takotsubo syndrome    Tubular adenoma of colon    Unspecified transient cerebral ischemia    Unspecified vitamin D  deficiency    Past Surgical History:  Procedure Laterality Date   ABDOMINAL HYSTERECTOMY     ABI  2006   normal   BONE EXOSTOSIS EXCISION Right 04/02/2024   Procedure: EXCISION, EXOSTOSIS;  Surgeon: Kit Rush, MD;  Location: [facility] OR;  Service: Orthopedics;  Laterality: Right;   BREAST EXCISIONAL BIOPSY     BREAST LUMPECTOMY Right 2016   BREAST LUMPECTOMY WITH RADIOACTIVE SEED LOCALIZATION Right 01/03/2015   Procedure: BREAST LUMPECTOMY WITH RADIOACTIVE SEED LOCALIZATION;  Surgeon: Deward Null III, MD;  Location: [facility] OR;  Service: General;  Laterality: Right;   CARDIAC DEFIBRILLATOR PLACEMENT  2006; 2012   BSX single chamber ICD   CARDIAC DEFIBRILLATOR PLACEMENT  2025   carotid dopplers  2006   neg   CEREBRAL ANEURYSM REPAIR  02/1999   COLONOSCOPY     EYE SURGERY  Right 02/2023   surgery center - for cataracts   HAMMER TOE SURGERY  11/15/2020   3rd digit bilateral feet    hospitalization  2004   GI bleed, PUD, diverticulosis (EGD,colonscopy)   hospitalization     PE, NSVT, s/p defib   JOINT REPLACEMENT  05/2021   KNEE ARTHROSCOPY     bilateral   LEFT HEART CATHETERIZATION WITH CORONARY ANGIOGRAM N/A 02/22/2012   Procedure: LEFT HEART CATHETERIZATION WITH CORONARY ANGIOGRAM;  Surgeon: Lonni [name] Cash, MD;  Location: [facility] CATH LAB;  Service: Cardiovascular;  Laterality: N/A;   PARTIAL HYSTERECTOMY     Fibroids   RIGHT HEART CATH N/A 06/18/2024   Procedure: RIGHT HEART CATH;  Surgeon: Rolan Ezra RAMAN, MD;  Location: [facility] INVASIVE CV LAB;  Service: Cardiovascular;  Laterality: N/A;   TONGUE BIOPSY  12/12/2017   due to sore tongue and white patches/abnormal cells   TOTAL KNEE ARTHROPLASTY Left 02/13/2016   Procedure: TOTAL KNEE  ARTHROPLASTY;  Surgeon: Marcey Raman, MD;  Location: [facility] OR;  Service: Orthopedics;  Laterality: Left;   TOTAL KNEE ARTHROPLASTY Right 05/22/2021   Procedure: TOTAL KNEE ARTHROPLASTY;  Surgeon: Raman Marcey, MD;  Location: [facility] ORS;  Service: Orthopedics;  Laterality: Right;  with block   TUBAL LIGATION     UPPER GASTROINTESTINAL ENDOSCOPY     Patient Active Problem List   Diagnosis Date Noted   Pulmonary embolism (HCC) 04/03/2023   Acute pulmonary embolism (HCC) 04/02/2023   COVID-19 virus infection 03/12/2023   IPF (idiopathic pulmonary fibrosis) (HCC) 02/25/2023   Bicipital tendinitis of left shoulder 11/09/2022   Partial symptomatic epilepsy with complex partial seizures, not intractable, without status epilepticus (HCC) 08/10/2022   Low back pain 08/10/2022   Gait abnormality 05/04/2022   Muscle spasm 05/04/2022   Upper airway cough syndrome 01/19/2022   Chronic left shoulder pain 11/13/2021   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   Localized swelling of right lower leg 08/29/2021   Tonsillar exudate 08/29/2021   Candida vaginitis 08/28/2021   Chronic pain syndrome 08/25/2021   Multifocal pneumonia 06/03/2021   Pre-operative respiratory examination 05/18/2021   Statin myopathy 04/19/2021   Pain in joint of right shoulder 02/10/2021   Arthritis of both acromioclavicular joints 01/02/2021   Trochanteric bursitis of right hip 10/03/2020   Tailbone injury 05/05/2020   UTI (urinary tract infection) 05/05/2020   Insomnia 05/05/2020   Leg cramps 02/16/2020   Dysuria 02/16/2020   Right hip impingement syndrome 02/01/2020   Right bicipital tenosynovitis 02/01/2020   Bronchiectasis without complication (HCC) 01/22/2020   Myofascial pain 12/21/2019   Estrogen deficiency 12/10/2019   Obesity (BMI 30-39.9) 12/10/2019   Bronchiectasis with (acute) exacerbation (HCC) 10/23/2019   Elevated LFTs 10/15/2019   Lower abdominal pain 09/01/2019   Neck pain 10/25/2017   Paresthesias in left hand  10/25/2017   Paresthesia of foot, bilateral 04/12/2017   Primary osteoarthritis of both hands 09/27/2016   Primary osteoarthritis of both knees 09/27/2016   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   Nasopharyngitis, 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/2016   S/P total knee replacement 02/13/2016   Chronic pain of both knees 12/06/2015   Globus pharyngeus 05/17/2015   Genetic testing 12/23/2014   Family history of breast cancer    Family history of colon cancer    Family history of pancreatic cancer    History of breast cancer 11/23/2014   Allergy to ACE inhibitors 09/27/2014   Prediabetes 06/23/2013   Muscle spasms of both lower extremities 01/15/2013   Hx of Clostridium difficile infection  10/10/2012   Dyspnea on exertion 04/02/2012   NICM (nonischemic cardiomyopathy) (HCC) 03/07/2012   Post-menopausal 01/11/2012   Atypical chest pain 11/07/2011   Obstructive sleep apnea 04/04/2010   V-tach (HCC) 01/03/2009   ICD  [employer]  Single chamber 01/03/2009   PFO (patent foramen ovale) 09/29/2008   Vitamin D  deficiency 08/17/2008   Hypothyroidism 11/18/2006   Hyperlipidemia 11/18/2006   Depression with anxiety 11/18/2006   Essential hypertension 11/18/2006   Mitral valve prolapse 11/18/2006   Cerebral aneurysm 11/18/2006   Allergic rhinitis 11/18/2006   GERD 11/18/2006   Diverticulosis of colon 11/18/2006   Fatty liver 11/18/2006   Fibromyalgia 11/18/2006    Onset date: 02/18/2024  REFERRING DIAG: G15.887 (ICD-10-CM) - IPF (idiopathic pulmonary fibrosis) (HCC) Z51.81 (ICD-10-CM) - Medication monitoring encounter G47.33 (ICD-10-CM) - OSA (obstructive sleep apnea) R05.3 (ICD-10-CM) - Chronic cough Z91.09 (ICD-10-CM) - House dust mite allergy R09.89 (ICD-10-CM) - Chronic throat clearing Z86.711 (ICD-10-CM) - History of pulmonary embolism  THERAPY DIAG:  Other voice and resonance disorders  Rationale for Evaluation and Treatment: Rehabilitation  SUBJECTIVE:   SUBJECTIVE STATEMENT: Yes,  my voice problem upsets me. Pt accompanied by: self  PERTINENT HISTORY: [name] 73 y.o. -returns for follow-up.  She says she is feeling stable but she is mostly bothered by chronic cough with clearing of the throat for the last 2 months.  She states that when she is out of the house the cough is less.  She does have dust mite allergy and she says she is taking all the precautions but nevertheless when she is out of the house the cough is less. She is also clearing the throat a lot.  She is asking for interventions.  But from a shortness of breath perspective she is stable.  She is tolerating Esbriet  well.  She had pulmonary function testing that shows a continued steady decline in DLCO  PAIN:  Are you having pain? Yes: NPRS scale: 8/10 Pain location: back Pain description: ache, pain Aggravating factors: laying on stomach Relieving factors: meds  FALLS: Has patient fallen in last 6 months? No, Number of falls: 0  LIVING ENVIRONMENT: Lives with: lives alone Lives in: House/apartment  PLOF:Level of assistance: Independent with ADLs, Independent with IADLs Employment: Retired  PATIENT GOALS:   OBJECTIVE:  Note: Objective measures were completed at Evaluation unless otherwise noted.   COGNITION: Overall cognitive status: Within functional limits for tasks assessed   SOCIAL HISTORY: Occupation: Retired Water  intake: optimal Caffeine/alcohol  intake: minimal Daily voice use: minimal  PERCEPTUAL VOICE ASSESSMENT: GRBAS (Completed on 07/07/24) General- 1 Roughness - 2 Breathiness - 1 A - 1 S - 1 Voice quality: hoarse and low vocal intensity Vocal abuse: habitual throat clearing - (Has improved)  Resonance: normal Respiratory function: thoracic breathing  OBJECTIVE VOICE ASSESSMENT: Maximum phonation time for sustained ah: 9.46 Conversational loudness average: 66 dB Conversational loudness range: 62-68 dB (70dB is WNL) S/z ratio: 1.05 (Suggestive of dysfunction  >1.4)   PATIENT REPORTED OUTCOME MEASURES (PROM): V-RQOL: 22 - she rated a 3 or moderate problem: speaking loudly and being heard, feeling frustrated due to her voice, having to repeat her self to be understood and rated a 2 for not knowing what will come out when she starts speaking.  and VHI: 14/40  TREATMENT DATE:  07/21/24: Pt is practicing diaphragmatic breathing at home; she notices that she feels better when she breathes diaphragmatically. Pt demonstrated her diaphragmatic breathing independently. Pt utilized diaphragmatic breathing during structured voice exercises and conversation with 85% consistency given frequent min A. SLP reviewed resonant voice therapy (RVT) to optimize pt's forward resonance for balancing phonation. Pt performed RVT with  /m/, words, phrases, and sentences with 85% consistency given frequent min A. SLP challenged pt to say /m/ and /k/ sentences to increase forward resonance for voice; pt was 80% consistent with achieving forward resonance for balanced phonation given frequent min A. SLP introduced conversation therapy training (CTT) to integrate RVT and diaphragmatic breathing into conversation level. SLP focused on establishing forward-resonance/kinesthetic awareness. Pt demonstrated voice self-awareness in 80% of opportunities when given frequent min A. Pt utilized forward resonance and diaphragmatic during structured conversation with 65% consistency to reduce laryngeal hyperfunction given frequent mod to min A. Pt is demonstrating improvement with self-awarness for making adjustments to voice during conversation and structured exercises. SLP provided pt HEP focused on RVT word production to enhance tx outcomes. Plan is to continue structured voice exercises and CTT in upcoming session.   07/13/24: Pt noted that her voice feels slightly better this  week compared to previous session. Pt states that her throat clearing has reduced significantly due to working on her voice. Pt is practicing diaphragmatic breathing at home; she notices that she feels better when she breathes diaphragmatically. Pt demonstrated her diaphragmatic breathing independently. Pt utilized diaphragmatic breathing during structured voice exercises and conversation with 85% consistency given frequent min A. SLP reviewed resonant voice therapy (RVT) to optimize pt's forward resonance for balancing phonation. Pt performed RVT with /b/, /v/, /m/, and /k/ words with 85% consistency given frequent min A. SLP challenged pt to say /m/ and /k/ sentences to increase forward resonance for voice; pt was 80% consistent with achieving forward resonance for balanced phonation given frequent min A. SLP introduced conversation therapy training (CTT) to integrate RVT and diaphragmatic breathing into conversation level. SLP focused on establishing forward-resonance/kinesthetic awareness. Pt demonstrated voice self-awareness in 80% of opportunities when given frequent min A. Pt utilized forward resonance and diaphragmatic during structured conversation with 65% consistency to reduce laryngeal hyperfunction given frequent mod to min A. Pt is demonstrating improvement with self-awarness for making adjustments to voice during conversation and structured exercises. SLP provided pt HEP focused on RVT word production to enhance tx outcomes. Plan is to continue structured voice exercises and CTT in upcoming session.   07/07/24: Pt noted that her coughing has improved in the past few weeks. Pt feels good about managing her chronic cough with cough suppression strategies outside of sessions. Pt states that her dysphonia is her main concern for now. SLP completed VHI-10 with pt scoring 14/40. SLP assessed pt's vocal quality using GRBAS; pt presents with a mild dysphonia characterized by increased roughness and voice  use on residual capacity. Pt may benefit from a referral to ENT for objective assessment of vocal fold anatomy/physiology. SLP introduced resonant voice therapy (RVT) for addressing dysphonia. SLP educated pt about parts of voice (breathing, phonation, artic/resonance) and how they relate to RVT. Pt completed RVT at syllable level with /b, v, and m/ with 80% consistency when given occasional max to mod A. Pt performed RVT at sentence level with /m/ words with 70% consistency when provided frequent mod A. SLP reviewed SOVTEs (tongue trill) to balance phonation. Pt performed SOVTEs with /r/ words with 80% consistency  when given frequent mod A.    06/22/24: Sonnia has been out of town for 4 weeks - She enters with hoarse voice and low volume. She has carried over alginate at night and feels it is helping. She has carried over cough suppression techniques successfully - rescues breathing and swallow - she reports cough is 50% reduced at least (subjectively). She has not completed her voice exercises since her last visit. Voice is hoarse - HEP for voice (PhoRTE) - with occasional min A to correct hoarse voice - sustained Ha with clear voice 10/10x; pitch glides with clear voice 8/10x; 10 sentences in high and low pitch with intermittent hoarseness at high pitch. Initiated training in SOVTE (semi-occluded vocal tract exercises) in water  with rare min A after initial modeling and instruction. Ruther sees pulmonologist this week. She initially denied difficulty swallowing but now endorses swallowing to be a trigger for cough - will discuss this next visit, trigger for cough vs dysphagia  04/13/24: Tajha enters with low volume, (65dB) hoarse voice. She reports coughing episode night before last as she did not have C PAP on. Coughing during the day reduced - she continues on pregabalin  1x a day. Keyonna reports she has been reducing throat clearing and improving her awareness of throat clearing behavior. She is successfully  swallowing instead of clearing her throat. Today, 1 throat clear with verbal cues to ID the throat clear. Targeted HEP for voice as clear voice achieve with high intensity vocal exercises - sustained Ha with 80-85dB for 5 seconds resulted in clear phonation 10/10 trial and occasional modeling and verbal cues. Pitch glides up and down, again starting using ha with clear phonation and usual min modeling, gestures and verbal cues for volume and to sustain high and low pitches. Reading in high and low pitch with increased vocal intensity with occasional modeling and verbal cues. Targeted increased vocal intensity generating 3 sentence descriptions with consistent modeling reduced hoarse voice to 40% of words uttered. Cedric feels too loud when she achieves WNL volume. She required consistent verbal cues and imitating to ID when she speaks on residual volume and in low glottal fry.   04/06/24: Eval completed - initiated training in throat clear awareness and throat clear avoidance. Initially she required consistent mod A to ID throat clearing behavior, which improved to occasional min verbal and visual cues (tally Ames). After initial training, Leyton demonstrated throat clear suppression with hard swallow, sniffs and/or sip 3/5x with usual mod A. Initiated training in cough suppression using gentle, relaxed sniff-sniff blow pattern and intermittent swallows or sips. She did not have coughing episode this session, however she demonstrated these strategies with rare min A in isolation as practice. Education re: irritable larynx, LPR and post nasal drip provided   PATIENT EDUCATION: Education details: See Treatment, See patient instructions, reflux education, strategies to suppress cough and throat clear, vocal hygiene Person educated: Patient Education method: Explanation, Demonstration, Verbal cues, and Handouts Education comprehension: verbalized understanding, verbal cues required, and needs further  education  HOME EXERCISE PROGRAM: In progress- Home exercises for diaphragmatic breathing an RVT were introduced during this session to optimize voice outcomes.  GOALS: Goals reviewed with patient? Yes   LONG TERM GOALS: Original Target date: 06/29/24 - New target date: 09/01/2024  Pt will clear her throat 2 or less times over 2 sessions Baseline:  Goal status: MET  2.  Pt will complete HEP for voice with mod I Baseline:  Goal status: ONGOING  3.  Pt will  use strategies to suppress or prevent cough with occasional min A over 2 sessions Baseline:  Goal status: MET  4.  Pt will report 50% reduction in coughing subjectively Baseline:  Goal status: MET  5.  Pt will achieve clear phonation and WNL volume 18/20 sentences Baseline:  Goal status: DISCONTINUE   6.  Pt will maintain WNL volume and clear phonation over 8 minute conversation with rare min A Baseline:  Goal status: ONGOING  7. Pt will utilize diaphragmatic breathing during conversation during  80% of opportunities when given rare min A.  Baseline: Pt uses thoracic breathing Goal status: ONGOING  8. Pt will report subjective improvements with voice according to VHI-10  Baseline: 14/40 Goal status: ONGOING  SHORT TERM GOALS:  Target date: 08/04/24  Pt will achieve clear phonation during sentence readings with 80% consistency given occasional min A. Baseline: N/A Goal status: ONGOING  2.   Pt will utilize diaphragmatic breathing during structured voice exercises in 80% of opportunities with occasional min A.  Baseline: Pt uses thoracic breathing Goal status: ONGOING  3. Pt adhere to voice HEP across 2 sessions. Baseline: N/A Goal status: ONGOING  4. Pt will use voice strategies during conversation in 4 minute segments with frequent min A.  Baseline: N/A Goal status: ONGOING  ASSESSMENT:  CLINICAL IMPRESSION: (Check Tx note on 07/07/24 for updated clinical impression) Patient is a 73 y.o. female who was  seen today for chronic cough and hoarse voice. She reports voice has been hoarse for about 2 months. She is referred by pulmonologist for chronic cough and irritable larynx. She identifies perfume, scents, laughing, post nasal drip and smoke as triggers for her cough. She reports the cough in uncontrollable at times and garners unwanted attention. Others ask if she is sick or needs water . She has stopped reading at church due to voice changes and is unable to sing due to voice changes. She is on PPI in am and H2 blocker pm for GERD as well as  flonase  for nasal drip and allergies. She consistently clears her throat reporting globus sensation. She reports she has to repeat herself frequently due to voice changes. Her conversation volume is low (66dB) as well. Trials of high intensity voice exercises did result in improved voice quality indicating she is a good candidate for speech therapy. I recommend skilled ST to reduce sx of irritable larynx and improve intelligibility for safety, QOL.  OBJECTIVE IMPAIRMENTS: include voice disorder. These impairments are limiting patient from effectively communicating at home and in community. Factors affecting potential to achieve goals and functional outcome are n/a.SABRA Patient will benefit from skilled SLP services to address above impairments and improve overall function.  REHAB POTENTIAL: Good  PLAN:  SLP FREQUENCY: 1-2x/week  SLP DURATION: 12 weeks  PLANNED INTERVENTIONS: Diet toleration management , Environmental controls, Internal/external aids, Functional tasks, Multimodal communication approach, SLP instruction and feedback, Compensatory strategies, Patient/family education, [phone] Treatment of speech (30 or 45 min) , and [id number]- Speech Eval Behavioral Qualitative Voice Resonance    Waddell [employer], CF-SLP 07/21/2024, 5:12 PM

## 2024-07-22 ENCOUNTER — Telehealth: Payer: Self-pay

## 2024-07-22 ENCOUNTER — Telehealth: Payer: Self-pay | Admitting: Internal Medicine

## 2024-07-22 NOTE — Telephone Encounter (Signed)
 Returned call. Camellia wanted to clarify dosing schedule. Reiterated dosing schedule as shown on referral form starting at Tyvaso 16mcg DPI QID, then increase cartridge strength by 16 mcg every 1-2 weeks as tolerated to reach maintenance dose 64 mcg per treatment session QID. NFN

## 2024-07-22 NOTE — Telephone Encounter (Signed)
 [name] with [facility] calling to state pt would like her Repatha  Rx to come to be filled there. Did not see a current Repatha  Rx to send. Please advise.

## 2024-07-22 NOTE — Telephone Encounter (Signed)
 Copied from CRM #[id number]. Topic: Clinical - Medication Question >> Jul 20, 2024  1:47 PM Devaughn RAMAN wrote: Reason for CRM: Eric-nurse with [facility] called to speak with Aleck to verify the referral and medication orders to get them started for the pt. Please f/u with Camellia at [phone].   Routing to [place] at pharmacy.

## 2024-07-23 ENCOUNTER — Telehealth: Payer: Self-pay | Admitting: *Deleted

## 2024-07-23 NOTE — Telephone Encounter (Signed)
 Per Dr.Segal's note from 09/08 Will trial off Repatha  for now and recheck a CK level and lipid panel in 2 months. If her CK levels come down while off the medication then it's possible that this is the cause of the elevated CK and we will refer to the lipid clinic at that time for further management. If her CK levels continue to trend up despite discontinuing the Repatha , then we will restart the Repatha  and recommend discussion with her rheumatologist   The follow up Lipid lab and CK level has not drawn yet so call patient to remind for the lab - patient will go for lab on Dec 9,2025  continuation of Repatha  decision pending on lab result

## 2024-07-23 NOTE — Telephone Encounter (Signed)
 error

## 2024-07-23 NOTE — Telephone Encounter (Signed)
 Received fax from [facility] that first shipment Tyvaso DPI was dispensed on 07/20/24.   [facility]: [phone]

## 2024-07-23 NOTE — Telephone Encounter (Signed)
 Received call from [facility] with [facility] regarding first treatment session complete with Tyvaso DPI 16mcg dose -   Prior to administration, BP 103/33mmHg.  After administration, BP 89/13mmHg.  Asymptomatic with low blood pressure.  After a few minutes, BP returned to baseline.   Eric calling as FYI to pulmonary team.

## 2024-07-23 NOTE — Telephone Encounter (Signed)
 Copied from CRM #[id number]. Topic: Clinical - Medication Question >> Jul 20, 2024  1:54 PM Essie A wrote: Reason for CRM: Camellia from [facility] called to verify a written order.  Please return his call at [phone].  Duplicate

## 2024-07-28 ENCOUNTER — Other Ambulatory Visit: Payer: Self-pay

## 2024-07-28 ENCOUNTER — Ambulatory Visit

## 2024-07-28 DIAGNOSIS — E782 Mixed hyperlipidemia: Secondary | ICD-10-CM

## 2024-07-28 DIAGNOSIS — R498 Other voice and resonance disorders: Secondary | ICD-10-CM

## 2024-07-28 DIAGNOSIS — G720 Drug-induced myopathy: Secondary | ICD-10-CM

## 2024-07-28 DIAGNOSIS — T466X5A Adverse effect of antihyperlipidemic and antiarteriosclerotic drugs, initial encounter: Secondary | ICD-10-CM

## 2024-07-28 NOTE — Telephone Encounter (Signed)
 MyChart message reminder about labs sent to patient.

## 2024-07-28 NOTE — Patient Instructions (Addendum)
 Before or during your cough:   Hold a shhhhhhh (like you are shushing someone) Then, breathe using diaphragm (Tummy Breathing)   During conversations:  Keep your breath consistent! Keep the forward resonance! Speak at a comfortable pitch!

## 2024-07-28 NOTE — Therapy (Signed)
 OUTPATIENT SPEECH LANGUAGE PATHOLOGY VOICE TREATMENT   Patient Name: [name] MRN: [id number] DOB:Jul 31, 1951, 73 y.o., female Today's Date: 07/28/2024  PCP: Leonce Carola PARAS PA-C REFERRING PROVIDER: Geronimo Amel, MD  END OF SESSION:  End of Session - 07/28/24 1316     Visit Number 7    Number of Visits 8    Date for Recertification  09/01/24    SLP Start Time 1232    SLP Stop Time  1315    SLP Time Calculation (min) 43 min    Activity Tolerance Patient tolerated treatment well              Past Medical History:  Diagnosis Date   AICD (automatic cardioverter/defibrillator) present    [employer]   Allergy 09/20/1994   Anxiety    Arthritis    Back pain    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   Bronchiectasis (HCC)    Cerebral aneurysm, nonruptured    had a clip put in   CHF (congestive heart failure) (HCC)    Clostridium difficile infection    Depressive disorder, not elsewhere classified    Diverticulosis of colon (without mention of hemorrhage)    Esophageal reflux    Fatty liver    Fibromyalgia    Gastritis    GERD (gastroesophageal reflux disease)    GI bleed 2004   Glaucoma    Hiatal hernia    History of COVID-19 05/04/2021   Hyperlipidemia    Hypertension    Hypothyroidism    Internal hemorrhoids    Joint pain    Multifocal pneumonia 06/03/2021   Obstructive sleep apnea (adult) (pediatric)    Osteoarthritis    Ostium secundum type atrial septal defect    Other chronic nonalcoholic liver disease    Other pulmonary embolism and infarction    Palpitations    Paroxysmal ventricular tachycardia (HCC)    Personal history of radiation therapy    PONV (postoperative nausea and vomiting)    Presence of permanent cardiac pacemaker    PUD (peptic ulcer disease)    Radiation 02/03/15-03/10/15   Right Breast   Sarcoid    per pt , not sure   Schatzki's ring    Shortness of breath    with exertion - has  oxygen prn via [place] at 2 L   Sleep apnea    uses CPAP nightly   Stroke [facility]) 2013   tia/ pt feels it was around 2008 0r 2009   Takotsubo syndrome    Tubular adenoma of colon    Unspecified transient cerebral ischemia    Unspecified vitamin D  deficiency    Past Surgical History:  Procedure Laterality Date   ABDOMINAL HYSTERECTOMY     ABI  2006   normal   BONE EXOSTOSIS EXCISION Right 04/02/2024   Procedure: EXCISION, EXOSTOSIS;  Surgeon: Kit Rush, MD;  Location: [facility] OR;  Service: Orthopedics;  Laterality: Right;   BREAST EXCISIONAL BIOPSY     BREAST LUMPECTOMY Right 2016   BREAST LUMPECTOMY WITH RADIOACTIVE SEED LOCALIZATION Right 01/03/2015   Procedure: BREAST LUMPECTOMY WITH RADIOACTIVE SEED LOCALIZATION;  Surgeon: Deward Null III, MD;  Location: [facility] OR;  Service: General;  Laterality: Right;   CARDIAC DEFIBRILLATOR PLACEMENT  2006; 2012   BSX single chamber ICD   CARDIAC DEFIBRILLATOR PLACEMENT  2025   carotid dopplers  2006   neg   CEREBRAL ANEURYSM REPAIR  02/1999   COLONOSCOPY     EYE  SURGERY Right 02/2023   surgery center - for cataracts   HAMMER TOE SURGERY  11/15/2020   3rd digit bilateral feet    hospitalization  2004   GI bleed, PUD, diverticulosis (EGD,colonscopy)   hospitalization     PE, NSVT, s/p defib   JOINT REPLACEMENT  05/2021   KNEE ARTHROSCOPY     bilateral   LEFT HEART CATHETERIZATION WITH CORONARY ANGIOGRAM N/A 02/22/2012   Procedure: LEFT HEART CATHETERIZATION WITH CORONARY ANGIOGRAM;  Surgeon: Lonni [name] Cash, MD;  Location: [facility] CATH LAB;  Service: Cardiovascular;  Laterality: N/A;   PARTIAL HYSTERECTOMY     Fibroids   RIGHT HEART CATH N/A 06/18/2024   Procedure: RIGHT HEART CATH;  Surgeon: Rolan Ezra RAMAN, MD;  Location: [facility] INVASIVE CV LAB;  Service: Cardiovascular;  Laterality: N/A;   TONGUE BIOPSY  12/12/2017   due to sore tongue and white patches/abnormal cells   TOTAL KNEE ARTHROPLASTY Left 02/13/2016   Procedure: TOTAL KNEE  ARTHROPLASTY;  Surgeon: Marcey Raman, MD;  Location: [facility] OR;  Service: Orthopedics;  Laterality: Left;   TOTAL KNEE ARTHROPLASTY Right 05/22/2021   Procedure: TOTAL KNEE ARTHROPLASTY;  Surgeon: Raman Marcey, MD;  Location: [facility] ORS;  Service: Orthopedics;  Laterality: Right;  with block   TUBAL LIGATION     UPPER GASTROINTESTINAL ENDOSCOPY     Patient Active Problem List   Diagnosis Date Noted   Pulmonary embolism (HCC) 04/03/2023   Acute pulmonary embolism (HCC) 04/02/2023   COVID-19 virus infection 03/12/2023   IPF (idiopathic pulmonary fibrosis) (HCC) 02/25/2023   Bicipital tendinitis of left shoulder 11/09/2022   Partial symptomatic epilepsy with complex partial seizures, not intractable, without status epilepticus (HCC) 08/10/2022   Low back pain 08/10/2022   Gait abnormality 05/04/2022   Muscle spasm 05/04/2022   Upper airway cough syndrome 01/19/2022   Chronic left shoulder pain 11/13/2021   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   Localized swelling of right lower leg 08/29/2021   Tonsillar exudate 08/29/2021   Candida vaginitis 08/28/2021   Chronic pain syndrome 08/25/2021   Multifocal pneumonia 06/03/2021   Pre-operative respiratory examination 05/18/2021   Statin myopathy 04/19/2021   Pain in joint of right shoulder 02/10/2021   Arthritis of both acromioclavicular joints 01/02/2021   Trochanteric bursitis of right hip 10/03/2020   Tailbone injury 05/05/2020   UTI (urinary tract infection) 05/05/2020   Insomnia 05/05/2020   Leg cramps 02/16/2020   Dysuria 02/16/2020   Right hip impingement syndrome 02/01/2020   Right bicipital tenosynovitis 02/01/2020   Bronchiectasis without complication (HCC) 01/22/2020   Myofascial pain 12/21/2019   Estrogen deficiency 12/10/2019   Obesity (BMI 30-39.9) 12/10/2019   Bronchiectasis with (acute) exacerbation (HCC) 10/23/2019   Elevated LFTs 10/15/2019   Lower abdominal pain 09/01/2019   Neck pain 10/25/2017   Paresthesias in left hand  10/25/2017   Paresthesia of foot, bilateral 04/12/2017   Primary osteoarthritis of both hands 09/27/2016   Primary osteoarthritis of both knees 09/27/2016   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   Nasopharyngitis, 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/2016   S/P total knee replacement 02/13/2016   Chronic pain of both knees 12/06/2015   Globus pharyngeus 05/17/2015   Genetic testing 12/23/2014   Family history of breast cancer    Family history of colon cancer    Family history of pancreatic cancer    History of breast cancer 11/23/2014   Allergy to ACE inhibitors 09/27/2014   Prediabetes 06/23/2013   Muscle spasms of both lower extremities 01/15/2013   Hx of Clostridium difficile  infection 10/10/2012   Dyspnea on exertion 04/02/2012   NICM (nonischemic cardiomyopathy) (HCC) 03/07/2012   Post-menopausal 01/11/2012   Atypical chest pain 11/07/2011   Obstructive sleep apnea 04/04/2010   V-tach (HCC) 01/03/2009   ICD  [employer]  Single chamber 01/03/2009   PFO (patent foramen ovale) 09/29/2008   Vitamin D  deficiency 08/17/2008   Hypothyroidism 11/18/2006   Hyperlipidemia 11/18/2006   Depression with anxiety 11/18/2006   Essential hypertension 11/18/2006   Mitral valve prolapse 11/18/2006   Cerebral aneurysm 11/18/2006   Allergic rhinitis 11/18/2006   GERD 11/18/2006   Diverticulosis of colon 11/18/2006   Fatty liver 11/18/2006   Fibromyalgia 11/18/2006    Onset date: 02/18/2024  REFERRING DIAG: G15.887 (ICD-10-CM) - IPF (idiopathic pulmonary fibrosis) (HCC) Z51.81 (ICD-10-CM) - Medication monitoring encounter G47.33 (ICD-10-CM) - OSA (obstructive sleep apnea) R05.3 (ICD-10-CM) - Chronic cough Z91.09 (ICD-10-CM) - House dust mite allergy R09.89 (ICD-10-CM) - Chronic throat clearing Z86.711 (ICD-10-CM) - History of pulmonary embolism  THERAPY DIAG:  Other voice and resonance disorders  Rationale for Evaluation and Treatment: Rehabilitation  SUBJECTIVE:   SUBJECTIVE STATEMENT: Yes,  my voice problem upsets me. Pt accompanied by: self  PERTINENT HISTORY: Dickey Roseann [place] 73 y.o. -returns for follow-up.  She says she is feeling stable but she is mostly bothered by chronic cough with clearing of the throat for the last 2 months.  She states that when she is out of the house the cough is less.  She does have dust mite allergy and she says she is taking all the precautions but nevertheless when she is out of the house the cough is less. She is also clearing the throat a lot.  She is asking for interventions.  But from a shortness of breath perspective she is stable.  She is tolerating Esbriet  well.  She had pulmonary function testing that shows a continued steady decline in DLCO  PAIN:  Are you having pain? Yes: NPRS scale: 8/10 Pain location: back Pain description: ache, pain Aggravating factors: laying on stomach Relieving factors: meds  FALLS: Has patient fallen in last 6 months? No, Number of falls: 0  LIVING ENVIRONMENT: Lives with: lives alone Lives in: House/apartment  PLOF:Level of assistance: Independent with ADLs, Independent with IADLs Employment: Retired  PATIENT GOALS:   OBJECTIVE:  Note: Objective measures were completed at Evaluation unless otherwise noted.   COGNITION: Overall cognitive status: Within functional limits for tasks assessed   SOCIAL HISTORY: Occupation: Retired Water  intake: optimal Caffeine/alcohol  intake: minimal Daily voice use: minimal  PERCEPTUAL VOICE ASSESSMENT: GRBAS (Completed on 07/07/24) General- 1 Roughness - 2 Breathiness - 1 A - 1 S - 1 Voice quality: hoarse and low vocal intensity Vocal abuse: habitual throat clearing - (Has improved)  Resonance: normal Respiratory function: thoracic breathing  OBJECTIVE VOICE ASSESSMENT: Maximum phonation time for sustained ah: 9.46 Conversational loudness average: 66 dB Conversational loudness range: 62-68 dB (70dB is WNL) S/z ratio: 1.05 (Suggestive of dysfunction  >1.4)   PATIENT REPORTED OUTCOME MEASURES (PROM): V-RQOL: 22 - she rated a 3 or moderate problem: speaking loudly and being heard, feeling frustrated due to her voice, having to repeat her self to be understood and rated a 2 for not knowing what will come out when she starts speaking.  and VHI: 14/40  TREATMENT DATE:   07/28/24: Pt notes that things are feeling better. Pt sang in church on Sunday, and she states that she felt great. Pt has practiced a little since prior session. Pt is actively using her voice strategies during conversations on a regular basis. SLP reviewed Conversation Therapy Training (CTT) to balance breath management and forward resonance for phonation. Pt utilized forward resonance and diaphragmatic breathing during conversation in 75% of opportunities given occasional min A. Pt's voice self-awareness continues to improve with pt noting that she can feel when her voice slips back in her throat and not forwardly placed. Pt cleared her throat 3x during session. SLP educated pt about utilizing throat clearing strategies in place of throat clearing to maximize vocal health. Pt is agreeable and displayed use of throat clearing strategies in 70% of opportunities given frequent min A. Pt notes increased coughing with one of her medications ( which requires inhaling a powder). SLP instructed pt to use rescue breathing (shhhhh followed by diaphragmatic breath) to minimize her cough for enhancing QOL. Pt is agreeable with this strategy; she will need reinforcement. Pt is making progress with voice goals and continues to display increasing self-awareness and independent maintenance of voice strategies. Plan is to conclude voice therapy by targeting CTT and reviewing throat-clearing/cough suppression strategies for optimizing vocal health and wellbeing.   07/21/24: Pt did  not practice much since last session due to Thanksgiving traveling. Pt demonstrated her diaphragmatic breathing independently. Pt utilized diaphragmatic breathing during structured voice exercises and conversation with 85% consistency given frequent min A. SLP reviewed resonant voice therapy (RVT) to optimize pt's forward resonance for balancing phonation. Pt performed RVT with  /m/, words, phrases, and sentences with 85% consistency given frequent min A.  SLP reintroduced conversation therapy training (CTT) to integrate RVT and diaphragmatic breathing into conversation level. SLP focused on developing forward-resonance/kinesthetic awareness. Pt demonstrated voice self-awareness in 85% of opportunities when given occasional min A. Pt utilized forward resonance and diaphragmatic during structured conversation with 70% consistency to reduce laryngeal hyperfunction given frequent mod to min A. Pt is demonstrating improvement with self-awarness for making adjustments to voice during conversation and structured exercises. SLP provided pt HEP focused on utilizing forward-resonance and diaphragmatic breathing during sentence readings. Plan is to continue structured voice exercises and CTT.  07/13/24: Pt noted that her voice feels slightly better this week compared to previous session. Pt states that her throat clearing has reduced significantly due to working on her voice. Pt is practicing diaphragmatic breathing at home; she notices that she feels better when she breathes diaphragmatically. Pt demonstrated her diaphragmatic breathing independently. Pt utilized diaphragmatic breathing during structured voice exercises and conversation with 85% consistency given frequent min A. SLP reviewed resonant voice therapy (RVT) to optimize pt's forward resonance for balancing phonation. Pt performed RVT with /b/, /v/, /m/, and /k/ words with 85% consistency given frequent min A. SLP challenged pt to say /m/ and /k/ sentences to  increase forward resonance for voice; pt was 80% consistent with achieving forward resonance for balanced phonation given frequent min A. SLP introduced conversation therapy training (CTT) to integrate RVT and diaphragmatic breathing into conversation level. SLP focused on establishing forward-resonance/kinesthetic awareness. Pt demonstrated voice self-awareness in 80% of opportunities when given frequent min A. Pt utilized forward resonance and diaphragmatic during structured conversation with 65% consistency to reduce laryngeal hyperfunction given frequent mod to min A. Pt is demonstrating improvement with self-awarness for making adjustments to voice during conversation and structured exercises. SLP  provided pt HEP focused on RVT word production to enhance tx outcomes. Plan is to continue structured voice exercises and CTT in upcoming session.   07/07/24: Pt noted that her coughing has improved in the past few weeks. Pt feels good about managing her chronic cough with cough suppression strategies outside of sessions. Pt states that her dysphonia is her main concern for now. SLP completed VHI-10 with pt scoring 14/40. SLP assessed pt's vocal quality using GRBAS; pt presents with a mild dysphonia characterized by increased roughness and voice use on residual capacity. Pt may benefit from a referral to ENT for objective assessment of vocal fold anatomy/physiology. SLP introduced resonant voice therapy (RVT) for addressing dysphonia. SLP educated pt about parts of voice (breathing, phonation, artic/resonance) and how they relate to RVT. Pt completed RVT at syllable level with /b, v, and m/ with 80% consistency when given occasional max to mod A. Pt performed RVT at sentence level with /m/ words with 70% consistency when provided frequent mod A. SLP reviewed SOVTEs (tongue trill) to balance phonation. Pt performed SOVTEs with /r/ words with 80% consistency when given frequent mod A.    06/22/24: Ashlynd has been out  of town for 4 weeks - She enters with hoarse voice and low volume. She has carried over alginate at night and feels it is helping. She has carried over cough suppression techniques successfully - rescues breathing and swallow - she reports cough is 50% reduced at least (subjectively). She has not completed her voice exercises since her last visit. Voice is hoarse - HEP for voice (PhoRTE) - with occasional min A to correct hoarse voice - sustained Ha with clear voice 10/10x; pitch glides with clear voice 8/10x; 10 sentences in high and low pitch with intermittent hoarseness at high pitch. Initiated training in SOVTE (semi-occluded vocal tract exercises) in water  with rare min A after initial modeling and instruction. Jazyiah sees pulmonologist this week. She initially denied difficulty swallowing but now endorses swallowing to be a trigger for cough - will discuss this next visit, trigger for cough vs dysphagia  04/13/24: Addalynne enters with low volume, (65dB) hoarse voice. She reports coughing episode night before last as she did not have C PAP on. Coughing during the day reduced - she continues on pregabalin  1x a day. Daren reports she has been reducing throat clearing and improving her awareness of throat clearing behavior. She is successfully swallowing instead of clearing her throat. Today, 1 throat clear with verbal cues to ID the throat clear. Targeted HEP for voice as clear voice achieve with high intensity vocal exercises - sustained Ha with 80-85dB for 5 seconds resulted in clear phonation 10/10 trial and occasional modeling and verbal cues. Pitch glides up and down, again starting using ha with clear phonation and usual min modeling, gestures and verbal cues for volume and to sustain high and low pitches. Reading in high and low pitch with increased vocal intensity with occasional modeling and verbal cues. Targeted increased vocal intensity generating 3 sentence descriptions with consistent modeling reduced  hoarse voice to 40% of words uttered. Monisha feels too loud when she achieves WNL volume. She required consistent verbal cues and imitating to ID when she speaks on residual volume and in low glottal fry.   04/06/24: Eval completed - initiated training in throat clear awareness and throat clear avoidance. Initially she required consistent mod A to ID throat clearing behavior, which improved to occasional min verbal and visual cues (tally Kulpa). After initial training,  Nazyia demonstrated throat clear suppression with hard swallow, sniffs and/or sip 3/5x with usual mod A. Initiated training in cough suppression using gentle, relaxed sniff-sniff blow pattern and intermittent swallows or sips. She did not have coughing episode this session, however she demonstrated these strategies with rare min A in isolation as practice. Education re: irritable larynx, LPR and post nasal drip provided   PATIENT EDUCATION: Education details: See Treatment, See patient instructions, reflux education, strategies to suppress cough and throat clear, vocal hygiene Person educated: Patient Education method: Explanation, Demonstration, Verbal cues, and Handouts Education comprehension: verbalized understanding, verbal cues required, and needs further education  HOME EXERCISE PROGRAM: In progress- Home exercises for diaphragmatic breathing an RVT were introduced during this session to optimize voice outcomes.  GOALS: Goals reviewed with patient? Yes   LONG TERM GOALS: Original Target date: 06/29/24 - New target date: 09/01/2024  Pt will clear her throat 2 or less times over 2 sessions Baseline:  Goal status: MET  2.  Pt will complete HEP for voice with mod I Baseline:  Goal status: ONGOING  3.  Pt will use strategies to suppress or prevent cough with occasional min A over 2 sessions Baseline:  Goal status: MET  4.  Pt will report 50% reduction in coughing subjectively Baseline:  Goal status: MET  5.  Pt  will achieve clear phonation and WNL volume 18/20 sentences Baseline:  Goal status: DISCONTINUE   6.  Pt will maintain WNL volume and clear phonation over 8 minute conversation with rare min A Baseline:  Goal status: ONGOING  7. Pt will utilize diaphragmatic breathing during conversation during  80% of opportunities when given rare min A.  Baseline: Pt uses thoracic breathing Goal status: ONGOING  8. Pt will report subjective improvements with voice according to VHI-10  Baseline: 14/40 Goal status: ONGOING  SHORT TERM GOALS:  Target date: 08/04/24  Pt will achieve clear phonation during sentence readings with 80% consistency given occasional min A. Baseline: N/A Goal status: ONGOING  2.   Pt will utilize diaphragmatic breathing during structured voice exercises in 80% of opportunities with occasional min A.  Baseline: Pt uses thoracic breathing Goal status: ONGOING  3. Pt adhere to voice HEP across 2 sessions. Baseline: N/A Goal status: ONGOING  4. Pt will use voice strategies during conversation in 4 minute segments with frequent min A.  Baseline: N/A Goal status: ONGOING  ASSESSMENT:  CLINICAL IMPRESSION: (Check Tx note on 07/07/24 for updated clinical impression) Patient is a 73 y.o. female who was seen today for chronic cough and hoarse voice. She reports voice has been hoarse for about 2 months. She is referred by pulmonologist for chronic cough and irritable larynx. She identifies perfume, scents, laughing, post nasal drip and smoke as triggers for her cough. She reports the cough in uncontrollable at times and garners unwanted attention. Others ask if she is sick or needs water . She has stopped reading at church due to voice changes and is unable to sing due to voice changes. She is on PPI in am and H2 blocker pm for GERD as well as  flonase  for nasal drip and allergies. She consistently clears her throat reporting globus sensation. She reports she has to repeat herself  frequently due to voice changes. Her conversation volume is low (66dB) as well. Trials of high intensity voice exercises did result in improved voice quality indicating she is a good candidate for speech therapy. I recommend skilled ST to reduce sx of irritable larynx  and improve intelligibility for safety, QOL.  OBJECTIVE IMPAIRMENTS: include voice disorder. These impairments are limiting patient from effectively communicating at home and in community. Factors affecting potential to achieve goals and functional outcome are n/a.SABRA Patient will benefit from skilled SLP services to address above impairments and improve overall function.  REHAB POTENTIAL: Good  PLAN:  SLP FREQUENCY: 1-2x/week  SLP DURATION: 12 weeks  PLANNED INTERVENTIONS: Diet toleration management , Environmental controls, Internal/external aids, Functional tasks, Multimodal communication approach, SLP instruction and feedback, Compensatory strategies, Patient/family education, [id number] Treatment of speech (30 or 45 min) , and [id number]- Speech Eval Behavioral Qualitative Voice Resonance    Waddell [employer], CF-SLP 07/28/2024, 2:17 PM

## 2024-07-28 NOTE — Progress Notes (Signed)
 Lab order placed per documentation in previous telephone encounter from V. Patel.

## 2024-07-29 LAB — LIPID PANEL
Chol/HDL Ratio: 4.3 ratio (ref 0.0–4.4)
Cholesterol, Total: 253 mg/dL — ABNORMAL HIGH (ref 100–199)
HDL: 59 mg/dL (ref >39)
LDL Chol Calc (NIH): 179 mg/dL — ABNORMAL HIGH (ref 0–99)
Triglycerides: 87 mg/dL (ref 0–149)
VLDL Cholesterol Cal: 15 mg/dL (ref 5–40)

## 2024-07-29 LAB — CK: Total CK: 171 U/L (ref 32–182)

## 2024-08-03 ENCOUNTER — Encounter

## 2024-08-03 LAB — ECHOCARDIOGRAM COMPLETE
AR max vel: 2.81 cm2
AV Area VTI: 2.99 cm2
AV Area mean vel: 2.94 cm2
AV Mean grad: 3 mmHg
AV Peak grad: 6.6 mmHg
Ao pk vel: 1.28 m/s
Area-P 1/2: 4.65 cm2
Calc EF: 45.9 %
MV VTI: 2.95 cm2
S' Lateral: 3.4 cm
Single Plane A2C EF: 35.8 %
Single Plane A4C EF: 53 %

## 2024-08-04 ENCOUNTER — Telehealth: Payer: Self-pay | Admitting: *Deleted

## 2024-08-04 ENCOUNTER — Ambulatory Visit

## 2024-08-04 NOTE — Telephone Encounter (Unsigned)
 Copied from CRM #[id number]. Topic: Clinical - Prescription Issue >> Jul 29, 2024 11:21 AM Leila BROCKS wrote: Reason for CRM: [place] nurse from [facility] [phone] wants to speak with Dr. Reeves nurse regarding patient's medication Treprostinil (TYVASO DPI MAINTENANCE KIT) 16 MCG POWD. Dayton had another visit with patient today, patient missed one dosage, side effects are minor cough, blood pressure greater than 100 systolic, today was 116/79 and planning to increase medication to 32 mcg tomorrow. Unable to reach CAL. Please advise and call back.

## 2024-08-06 ENCOUNTER — Other Ambulatory Visit: Payer: Self-pay

## 2024-08-06 ENCOUNTER — Other Ambulatory Visit (HOSPITAL_COMMUNITY): Payer: Self-pay

## 2024-08-06 ENCOUNTER — Encounter

## 2024-08-06 NOTE — Telephone Encounter (Signed)
 Spoke with [place] from [employer]. Patient is now on Tyvaso 48mcg dose, no significant issues.

## 2024-08-06 NOTE — Telephone Encounter (Signed)
 Returned call to [employer]. He explained this message was an FYI, not needing a call back.  Patient is now on Tyvaso DPI 48 mcg dose, doing well overall.   NFN

## 2024-08-06 NOTE — Progress Notes (Signed)
 Specialty Pharmacy Refill Coordination Note  [name] is a 73 y.o. female contacted today regarding refills of specialty medication(s) Pirfenidone    Patient requested Delivery   Delivery date: 08/11/24   Verified address: [address] [place],  [place] [address]   Medication will be filled on: 08/10/24    Clinical Intervention Note  Clinical Intervention Notes: Patient reported starting Tyvaso, no DDIs were identified with her pirfenidone .   Clinical Intervention Outcomes: Prevention of an adverse drug event   Silvano LOISE Dolly Specialty Pharmacist

## 2024-08-07 DIAGNOSIS — Z006 Encounter for examination for normal comparison and control in clinical research program: Secondary | ICD-10-CM

## 2024-08-07 NOTE — Research (Signed)
"  °  Title: A Real-world Patient Registry in Group 3 Pulmonary Hypertension Associated With Interstitial Lung Disease (PH-ILD)   Principal Investigator: Dr Adina Beals  Synopsis: [facility] is a real-world patient registry study for patients with Group 3 Pulmonary Hypertension associated with Interstitial Lung Disease. The study involves three cohorts. Cohort 1 will include patients who are not receiving inhaled Treprostinil at the time of study enrollment. Cohort 2 will include patients who are newly initiated on Tyvaso/Tyvaso DPI (within 60 days or less). Cohort 3 will include patients who have been receiving Tyvaso/Tyvaso DPI for greater than 60 days.  Protocol # [id number]; [id number] # [id number]  Sponsor: [employer] [address] [place], [place] [address]  Key eligibility criteria:  Inclusion criteria:  Adults aged 19 years or older, diagnosis of ILD by traditional HRCT determined by the site/institution that conducted the HRCT, patients with connective tissue disease must have a baseline FVC of less than 70%, and right heart catheterization confirmed pulmonary hypertension.   Exclusion criteria:  Confirmed diagnosis of Group 1,2,4, or 5 pulmonary hypertension, confirmed diagnosis of Group 3 pulmonary hypertension related to COPD or conditions that cause hypoxemia, and patients receiving Yutrepia (inhaled Treprostinil at Baseline).  Key points:  Patients who enroll will be followed for up to 5 years after study enrollment. Assessments are repeated every 6 months for up to 5 years, with a 45-day window. Co-enrollment in other observational or interventional studies is permitted.  [facility] Clinical Research Coordinator note:   This visit for Subject [name] with DOB: May 04, 1951 on 08/07/2024 for the above protocol is Visit/Encounter # 1 and is for purpose of research.    The Subject ID for the study is [id number]  The consent for this encounter is under Protocol  Version 1.0, Investigator Brochure Version N/A, Consent Version 25Sep2024 Revised 12Jun2025 and is currently IRB approved.   Subject expressed continued interest and consent in continuing as a study subject. '  Subject confirmed that there was no change in contact information (e.g. address, telephone, email). Subject thanked for participation in research and contribution to science.   In this visit 08/07/2024 the subject will be evaluated by Sub-Investigator named Dr. Elsie Roses.   This research coordinator has verified that the above investigator is up to date with his training logs.   The Subject was informed that the PI Dr. Donnice Beals continues to have oversight of the subject's visits and course through relevant discussions, reviews, and also specifically of this visit by routing of this note to the PI.   This CRC reviewed the interim progress notes in the electronic medical records: Yes   This visit is a key visit of screening. The PI is not available for this visit.  The Sub-Investigator, Dr. Elsie Roses was present throughout the visit.    The informed consent document was reviewed with the subject prior to any study-specific procedures. All of the subject's questions and concerns were addressed prior to the signing of the document which was signed and dated by the subject at 08:24. All scheduled events for the Screening visit were completed per protocol and a tentative date was scheduled for the subject's next visit.     Signed by  Tinnie Mulberry  Clinical Research Coordinator  [facility]  [place], [place] Signature Time/Date: 11:47 AM 08/07/2024  "

## 2024-08-13 ENCOUNTER — Encounter

## 2024-08-14 ENCOUNTER — Ambulatory Visit: Admission: EM | Admit: 2024-08-14 | Discharge: 2024-08-14 | Disposition: A | Discharge status: Home / Self Care

## 2024-08-14 ENCOUNTER — Encounter: Payer: Self-pay | Admitting: Emergency Medicine

## 2024-08-14 DIAGNOSIS — N3001 Acute cystitis with hematuria: Secondary | ICD-10-CM | POA: Diagnosis not present

## 2024-08-14 DIAGNOSIS — R300 Dysuria: Secondary | ICD-10-CM | POA: Diagnosis present

## 2024-08-14 DIAGNOSIS — R350 Frequency of micturition: Secondary | ICD-10-CM | POA: Diagnosis present

## 2024-08-14 LAB — POCT URINE DIPSTICK
Bilirubin, UA: NEGATIVE
Glucose, UA: 500 mg/dL — AB
Ketones, POC UA: NEGATIVE mg/dL
Nitrite, UA: POSITIVE — AB
Protein Ur, POC: 30 mg/dL — AB
Spec Grav, UA: 1.02
Urobilinogen, UA: 0.2 U/dL
pH, UA: 6

## 2024-08-14 MED ORDER — SULFAMETHOXAZOLE-TRIMETHOPRIM 800-160 MG PO TABS: 1.0000 | ORAL_TABLET | Freq: Two times a day (BID) | ORAL | 0 refills | Status: AC

## 2024-08-14 NOTE — ED Triage Notes (Signed)
 Pt reports dysuria, urinary frequency, and oliguria x3 days. Denies fevers, flank pain, or abdominal pain. Drinking cranberry juice with no relief.

## 2024-08-14 NOTE — ED Provider Notes (Signed)
 " [facility]    CSN: [id number] Arrival date & time: 08/14/24  1742      History   Chief Complaint Chief Complaint  Patient presents with   Urinary Frequency   Dysuria    HPI [name] is a 73 y.o. female.   Pt presents today due to 3 days of dysuria, frequency, and oliguria. Pt denies fever, chills, nausea, vomiting, or back pain.   The history is provided by the patient.  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   Bronchiectasis (HCC)    Cerebral aneurysm, nonruptured    had a clip put in   CHF (congestive heart failure) (HCC)    Clostridium difficile infection    Depressive disorder, not elsewhere classified    Diverticulosis of colon (without mention of hemorrhage)    Esophageal reflux    Fatty liver    Fibromyalgia    Gastritis    GERD (gastroesophageal reflux disease)    GI bleed 2004   Glaucoma    Hiatal hernia    History of COVID-19 05/04/2021   Hyperlipidemia    Hypertension    Hypothyroidism    Internal hemorrhoids    Joint pain    Multifocal pneumonia 06/03/2021   Obstructive sleep apnea (adult) (pediatric)    Osteoarthritis    Ostium secundum type atrial septal defect    Other chronic nonalcoholic liver disease    Other pulmonary embolism and infarction    Palpitations    Paroxysmal ventricular tachycardia (HCC)    Personal history of radiation therapy    PONV (postoperative nausea and vomiting)    Presence of permanent cardiac pacemaker    PUD (peptic ulcer disease)    Radiation 02/03/15-03/10/15   Right Breast   Sarcoid    per pt , not sure   Schatzki's ring    Shortness of breath    with exertion - has oxygen prn via [place] at 2 L   Sleep apnea    uses CPAP nightly   Stroke [facility]) 2013    tia/ pt feels it was around 2008 0r 2009   Takotsubo syndrome    Tubular adenoma of colon    Unspecified transient cerebral ischemia    Unspecified vitamin D  deficiency     Patient Active Problem List   Diagnosis Date Noted   Pulmonary embolism (HCC) 04/03/2023   Acute pulmonary embolism (HCC) 04/02/2023   COVID-19 virus infection 03/12/2023   IPF (idiopathic pulmonary fibrosis) (HCC) 02/25/2023   Bicipital tendinitis of left shoulder 11/09/2022   Partial symptomatic epilepsy with complex partial seizures, not intractable, without status epilepticus (HCC) 08/10/2022   Low back pain 08/10/2022   Gait abnormality 05/04/2022   Muscle spasm 05/04/2022   Upper airway cough syndrome 01/19/2022   Chronic left shoulder pain 11/13/2021   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   Localized swelling of right lower leg 08/29/2021   Tonsillar exudate 08/29/2021   Candida vaginitis 08/28/2021   Chronic pain syndrome 08/25/2021   Multifocal pneumonia 06/03/2021   Pre-operative respiratory examination 05/18/2021   Statin myopathy 04/19/2021   Pain in joint of right shoulder 02/10/2021   Arthritis of both acromioclavicular joints 01/02/2021   Trochanteric bursitis of right hip 10/03/2020  Tailbone injury 05/05/2020   UTI (urinary tract infection) 05/05/2020   Insomnia 05/05/2020   Leg cramps 02/16/2020   Dysuria 02/16/2020   Right hip impingement syndrome 02/01/2020   Right bicipital tenosynovitis 02/01/2020   Bronchiectasis without complication (HCC) 01/22/2020   Myofascial pain 12/21/2019   Estrogen deficiency 12/10/2019   Obesity (BMI 30-39.9) 12/10/2019   Bronchiectasis with (acute) exacerbation (HCC) 10/23/2019   Elevated LFTs 10/15/2019   Lower abdominal pain 09/01/2019   Neck pain 10/25/2017   Paresthesias in left hand 10/25/2017   Paresthesia of foot, bilateral 04/12/2017   Primary osteoarthritis of both hands 09/27/2016   Primary osteoarthritis of both knees 09/27/2016   TMJ  pain dysfunction syndrome 06/12/2016   Nasopharyngitis, 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/2016   S/P total knee replacement 02/13/2016   Chronic pain of both knees 12/06/2015   Globus pharyngeus 05/17/2015   Genetic testing 12/23/2014   Family history of breast cancer    Family history of colon cancer    Family history of pancreatic cancer    History of breast cancer 11/23/2014   Allergy to ACE inhibitors 09/27/2014   Prediabetes 06/23/2013   Muscle spasms of both lower extremities 01/15/2013   Hx of Clostridium difficile infection 10/10/2012   Dyspnea on exertion 04/02/2012   NICM (nonischemic cardiomyopathy) (HCC) 03/07/2012   Post-menopausal 01/11/2012   Atypical chest pain 11/07/2011   Obstructive sleep apnea 04/04/2010   V-tach (HCC) 01/03/2009   ICD  [employer]  Single chamber 01/03/2009   PFO (patent foramen ovale) 09/29/2008   Vitamin D  deficiency 08/17/2008   Hypothyroidism 11/18/2006   Hyperlipidemia 11/18/2006   Depression with anxiety 11/18/2006   Essential hypertension 11/18/2006   Mitral valve prolapse 11/18/2006   Cerebral aneurysm 11/18/2006   Allergic rhinitis 11/18/2006   GERD 11/18/2006   Diverticulosis of colon 11/18/2006   Fatty liver 11/18/2006   Fibromyalgia 11/18/2006    Past Surgical History:  Procedure Laterality Date   ABDOMINAL HYSTERECTOMY     ABI  2006   normal   BONE EXOSTOSIS EXCISION Right 04/02/2024   Procedure: EXCISION, EXOSTOSIS;  Surgeon: Kit Rush, MD;  Location: [facility] OR;  Service: Orthopedics;  Laterality: Right;   BREAST EXCISIONAL BIOPSY     BREAST LUMPECTOMY Right 2016   BREAST LUMPECTOMY WITH RADIOACTIVE SEED LOCALIZATION Right 01/03/2015   Procedure: BREAST LUMPECTOMY WITH RADIOACTIVE SEED LOCALIZATION;  Surgeon: Deward Null III, MD;  Location: [facility] OR;  Service: General;  Laterality: Right;   CARDIAC DEFIBRILLATOR PLACEMENT  2006; 2012   BSX single chamber ICD   CARDIAC DEFIBRILLATOR PLACEMENT  2025   carotid dopplers  2006   neg    CEREBRAL ANEURYSM REPAIR  02/1999   COLONOSCOPY     EYE SURGERY Right 02/2023   surgery center - for cataracts   HAMMER TOE SURGERY  11/15/2020   3rd digit bilateral feet    hospitalization  2004   GI bleed, PUD, diverticulosis (EGD,colonscopy)   hospitalization     PE, NSVT, s/p defib   JOINT REPLACEMENT  05/2021   KNEE ARTHROSCOPY     bilateral   LEFT HEART CATHETERIZATION WITH CORONARY ANGIOGRAM N/A 02/22/2012   Procedure: LEFT HEART CATHETERIZATION WITH CORONARY ANGIOGRAM;  Surgeon: Lonni [name] Cash, MD;  Location: [facility] CATH LAB;  Service: Cardiovascular;  Laterality: N/A;   PARTIAL HYSTERECTOMY     Fibroids   RIGHT HEART CATH N/A 06/18/2024   Procedure: RIGHT HEART CATH;  Surgeon: Rolan Ezra RAMAN, MD;  Location: [facility] CV LAB;  Service:  Cardiovascular;  Laterality: N/A;   TONGUE BIOPSY  12/12/2017   due to sore tongue and white patches/abnormal cells   TOTAL KNEE ARTHROPLASTY Left 02/13/2016   Procedure: TOTAL KNEE ARTHROPLASTY;  Surgeon: Marcey Raman, MD;  Location: [facility] OR;  Service: Orthopedics;  Laterality: Left;   TOTAL KNEE ARTHROPLASTY Right 05/22/2021   Procedure: TOTAL KNEE ARTHROPLASTY;  Surgeon: Raman Marcey, MD;  Location: [facility] ORS;  Service: Orthopedics;  Laterality: Right;  with block   TUBAL LIGATION     UPPER GASTROINTESTINAL ENDOSCOPY      OB History     Gravida  4   Para  2   Term  2   Preterm      AB  2   Living  2      SAB  2   IAB      Ectopic      Multiple      Live Births  2            Home Medications    Prior to Admission medications  Medication Sig Start Date End Date Taking? Authorizing Provider  azelastine  (ASTELIN ) 0.1 % nasal spray Place 1 spray into both nostrils 2 (two) times daily. Use in each nostril as directed 10/24/21  Yes Sood, Vineet, MD  budesonide -formoterol  (SYMBICORT ) 80-4.5 MCG/ACT inhaler Inhale 1 puff into the lungs 2 (two) times daily. 06/21/23  Yes Parrett, Tammy S, NP  carvedilol  (COREG ) 3.125  MG tablet Take 1 tablet (3.125 mg total) by mouth 2 (two) times daily. 07/01/24  Yes Segal, Jared E, DO  Cholecalciferol  (DIALYVITE VITAMIN D  5000) 125 MCG (5000 UT) capsule Take 5,000 Units by mouth daily.   Yes [provider]  cloBAZam  (ONFI ) 10 MG tablet Take 1 tablet by mouth twice daily 07/13/24  Yes Gayland Lauraine PARAS, NP  ELIQUIS  5 MG TABS tablet TAKE 1 TABLET TWICE DAILY 11/11/23  Yes Geronimo Amel, MD  empagliflozin  (JARDIANCE ) 10 MG TABS tablet TAKE 1 TABLET EVERY DAY 04/16/24  Yes Fernande Elspeth BROCKS, MD  famotidine  (PEPCID ) 40 MG tablet TAKE 1 TABLET AT BEDTIME (NEED MD APPOINTMENT) 03/18/24  Yes Pyrtle, Gordy HERO, MD  hydroxypropyl methylcellulose / hypromellose (ISOPTO TEARS / GONIOVISC) 2.5 % ophthalmic solution Place 1 drop into both eyes at bedtime.   Yes [provider]  latanoprost  (XALATAN ) 0.005 % ophthalmic solution Place 1 drop into both eyes at bedtime.   Yes [provider]  levocetirizine (XYZAL ) 5 MG tablet Take 5 mg by mouth at bedtime.   Yes [provider]  levothyroxine  (SYNTHROID ) 50 MCG tablet TAKE 1 TABLET EVERY DAY 03/08/23  Yes Tanda Bleacher, MD  magnesium  oxide (MAG-OX) 400 (240 Mg) MG tablet TAKE 1 TABLET TWICE DAILY 04/08/24  Yes Ursuy, Renee Lynn, PA-C  montelukast  (SINGULAIR ) 10 MG tablet TAKE 1 TABLET AT BEDTIME 02/19/24  Yes Olalere, Adewale A, MD  Multiple Vitamins-Minerals (MULTIVITAMIN PO) Take 1 tablet by mouth daily.   Yes [provider]  pantoprazole  (PROTONIX ) 40 MG tablet TAKE 1 TABLET EVERY DAY 04/27/24  Yes Pyrtle, Gordy HERO, MD  Pirfenidone  267 MG TABS Take 3 tablets (801 mg total) by mouth 3 (three) times daily with meals. **CHECK LIVER FUNCTION EVERY 3 MONTHS** 04/27/24  Yes Geronimo Amel, MD  polyethylene glycol powder (GLYCOLAX /MIRALAX ) powder Take 17 grams by mouth daily 02/27/16  Yes Pyrtle, Gordy HERO, MD  potassium chloride  (KLOR-CON  M) 10 MEQ tablet TAKE 1/2 TABLET EVERY DAY 02/12/24  Yes Walker, Caitlin S, NP  pregabalin  (LYRICA ) 25 MG capsule Take 1 capsule (25 mg total) by mouth 3 (three) times daily. Start with 1 pill/day and work up s discussed- for neurogenic cough- stopping gabapentin  03/06/24  Yes Lovorn, Megan, MD  sulfamethoxazole -trimethoprim  (BACTRIM  DS) 800-160 MG tablet Take 1 tablet by mouth 2 (two) times daily for 5 days. 08/14/24 08/19/24 Yes Andra Krabbe C, PA-C  traZODone  (DESYREL ) 100 MG tablet TAKE 1 TO 1 AND 1/2 TABLETS AT BEDTIME 12/18/23  Yes Lovorn, Megan, MD  Treprostinil (TYVASO DPI MAINTENANCE KIT) 16 MCG POWD Inhale 16 mcg into the lungs in the morning, at noon, in the evening, and at bedtime.   Yes [provider]  oxyCODONE  (OXY IR/ROXICODONE ) 5 MG immediate release tablet Take 0.5-1 tablets (2.5-5 mg total) by mouth 2 (two) times daily as needed for severe pain (pain score 7-10). ICD10- G89.4- chronic pain in hips/shoulders due to inflammatory arthritis 06/08/24   Lovorn, Megan, MD  Respiratory Therapy Supplies (FLUTTER) DEVI 1 Device by Does not apply route as needed. 09/22/18   Oley Bascom RAMAN, NP    Family History Family History  Problem Relation Age of Onset   Diabetes Mother    Alzheimer's disease Mother    Hypertension Mother    Obesity Mother    Arthritis Mother    Heart disease Father    Seizures Sister    Allergies Sister    Colon polyps Sister    Heart disease Sister    Early death Sister    Anxiety disorder Sister    Arthritis Sister    Depression Sister    Hypertension Sister    Other Brother        Thyroid  problem 10/2016   Hearing loss Brother    Vision loss Brother    Heart disease Brother    Prostate cancer Brother 42       same brother as throat cancer   Throat cancer Brother        dx in his 53s; also a smoker   Arthritis Brother    Hypertension Brother    Breast cancer Maternal Aunt 55   Colon cancer Maternal Aunt 75       same sister as breast at 62   Breast cancer Maternal Aunt        dx in her 4s   Lung cancer  Maternal Uncle    Cancer Maternal Grandmother 12       colon cancer or abdominal cancer   Lung cancer Maternal Grandfather 37   Heart disease Son        Cardiac Arrest 07/2016   Pancreatic cancer Cousin 57       maternal first cousin   Breast cancer Cousin        paternal first cousin twice removed died in her 30s   Congestive Heart Failure Nephew    Stroke Neg Hx    Esophageal cancer Neg Hx    Stomach cancer Neg Hx     Social History Social History[1]   Allergies   Ace inhibitors, Amitriptyline hcl, Atorvastatin, Dilantin [phenytoin sodium extended], Duloxetine  hcl, Paroxetine, Ramipril , Rosuvastatin, Pradaxa  [dabigatran  etexilate mesylate], Carbamazepine, Codeine , Phenytoin sodium extended, Simvastatin , and Wellbutrin  [bupropion ]   Review of Systems Review of Systems  Genitourinary:  Positive for dysuria and frequency.     Physical Exam Triage Vital Signs ED Triage Vitals  Encounter Vitals Group     BP 08/14/24 1800 119/76     Girls Systolic BP Percentile --  Girls Diastolic BP Percentile --      Boys Systolic BP Percentile --      Boys Diastolic BP Percentile --      Pulse Rate 08/14/24 1800 77     Resp 08/14/24 1800 14     Temp 08/14/24 1800 97.8 F (36.6 C)     Temp Source 08/14/24 1800 Oral     SpO2 08/14/24 1800 94 %     Weight --      Height --      Head Circumference --      Peak Flow --      Pain Score 08/14/24 1752 0     Pain Loc --      Pain Education --      Exclude from Growth Chart --    No data found.  Updated Vital Signs BP 119/76 (BP Location: Right Arm)   Pulse 77   Temp 97.8 F (36.6 C) (Oral)   Resp 14   LMP  (LMP Unknown)   SpO2 94%   Visual Acuity Right Eye Distance:   Left Eye Distance:   Bilateral Distance:    Right Eye Near:   Left Eye Near:    Bilateral Near:     Physical Exam Vitals and nursing note reviewed.  Constitutional:      General: She is not in acute distress.    Appearance: Normal appearance. She  is not ill-appearing, toxic-appearing or diaphoretic.  Eyes:     General: No scleral icterus. Cardiovascular:     Rate and Rhythm: Normal rate and regular rhythm.     Heart sounds: Normal heart sounds.  Pulmonary:     Effort: Pulmonary effort is normal. No respiratory distress.     Breath sounds: Normal breath sounds. No wheezing or rhonchi.  Abdominal:     General: Abdomen is flat. Bowel sounds are normal.     Palpations: Abdomen is soft.     Tenderness: There is abdominal tenderness in the right lower quadrant, suprapubic area and left lower quadrant. There is no right CVA tenderness or left CVA tenderness.  Skin:    General: Skin is warm.  Neurological:     Mental Status: She is alert and oriented to person, place, and time.  Psychiatric:        Mood and Affect: Mood normal.        Behavior: Behavior normal.      UC Treatments / Results  Labs (all labs ordered are listed, but only abnormal results are displayed) Labs Reviewed  POCT URINE DIPSTICK - Abnormal; Notable for the following components:      Result Value   Color, UA straw (*)    Clarity, UA cloudy (*)    Glucose, UA =500 (*)    Blood, UA large (*)    Protein Ur, POC =30 (*)    Nitrite, UA Positive (*)    Leukocytes, UA Trace (*)    All other components within normal limits  URINE CULTURE    EKG   Radiology No results found.  Procedures Procedures (including critical care time)  Medications Ordered in UC Medications - No data to display  Initial Impression / Assessment and Plan / UC Course  I have reviewed the triage vital signs and the nursing notes.  Pertinent labs & imaging results that were available during my care of the patient were reviewed by me and considered in my medical decision making (see chart for details).    Final Clinical Impressions(s) /  UC Diagnoses   Final diagnoses:  Acute cystitis with hematuria   Discharge Instructions   None    ED Prescriptions     Medication  Sig Dispense Auth. Provider   sulfamethoxazole -trimethoprim  (BACTRIM  DS) 800-160 MG tablet Take 1 tablet by mouth 2 (two) times daily for 5 days. 10 tablet Andra Corean BROCKS, PA-C      PDMP not reviewed this encounter.    [1]  Social History Tobacco Use   Smoking status: Former    Current packs/day: 0.00    Average packs/day: 1 pack/day for 20.0 years (20.0 ttl pk-yrs)    Types: Cigarettes    Start date: 08/20/1969    Quit date: 08/20/1989    Years since quitting: 35.0    Passive exposure: Past   Smokeless tobacco: Never  Vaping Use   Vaping status: Never Used  Substance Use Topics   Alcohol  use: Yes    Alcohol /week: 1.0 standard drink of alcohol     Types: 1 Glasses of wine per week    Comment: Rare - occasionally   Drug use: Not Currently     Andra Corean BROCKS DEVONNA 08/14/24 1832  "

## 2024-08-16 LAB — URINE CULTURE: Culture: >=100000 — AB

## 2024-08-17 ENCOUNTER — Ambulatory Visit (HOSPITAL_COMMUNITY): Payer: Self-pay

## 2024-08-18 ENCOUNTER — Other Ambulatory Visit: Payer: Self-pay | Admitting: Pulmonary Disease

## 2024-08-18 DIAGNOSIS — Z006 Encounter for examination for normal comparison and control in clinical research program: Secondary | ICD-10-CM

## 2024-08-18 DIAGNOSIS — I2723 Pulmonary hypertension due to lung diseases and hypoxia: Secondary | ICD-10-CM

## 2024-08-21 ENCOUNTER — Telehealth: Payer: Self-pay | Admitting: Internal Medicine

## 2024-08-21 DIAGNOSIS — I2723 Pulmonary hypertension due to lung diseases and hypoxia: Secondary | ICD-10-CM

## 2024-08-21 NOTE — Telephone Encounter (Signed)
 Copied from CRM [id number]. Topic: Clinical - Medication Refill >> Aug 21, 2024  2:34 PM Isabell A wrote: Medication: Tyvaso 64 micrograms  Has the patient contacted their pharmacy? Yes (Agent: If no, request that the patient contact the pharmacy for the refill. If patient does not wish to contact the pharmacy document the reason why and proceed with request.) (Agent: If yes, when and what did the pharmacy advise?)  This is the patient's preferred pharmacy:  [facility]  [address] [place] [address]  Is this the correct pharmacy for this prescription? [facility]  If no, delete pharmacy and type the correct one.   Has the prescription been filled recently? No  Is the patient out of the medication? Yes  Has the patient been seen for an appointment in the last year OR does the patient have an upcoming appointment? Yes  Can we respond through MyChart? No  Agent: Please be advised that Rx refills may take up to 3 business days. We ask that you follow-up with your pharmacy.

## 2024-08-24 MED ORDER — TYVASO DPI INSTITUTIONAL KIT 64 MCG IN POWD: 64.0000 ug | Freq: Four times a day (QID) | RESPIRATORY_TRACT | 3 refills | Status: AC

## 2024-08-24 NOTE — Telephone Encounter (Signed)
 Rx for Tyvaso  DPI 64 mcg maintenance kit triaged to [facility].

## 2024-08-24 NOTE — Telephone Encounter (Signed)
 Patient is requesting refill of tyvaso 

## 2024-08-24 NOTE — Telephone Encounter (Signed)
 Received request from [facility] for prescription for Tyvaso  DPI maintenance. Signed and returned to fax # [phone]

## 2024-08-25 ENCOUNTER — Ambulatory Visit: Attending: Internal Medicine

## 2024-08-25 DIAGNOSIS — R498 Other voice and resonance disorders: Secondary | ICD-10-CM | POA: Diagnosis present

## 2024-08-25 NOTE — Therapy (Signed)
 " OUTPATIENT SPEECH LANGUAGE PATHOLOGY VOICE TREATMENT/DISCHARGE SUMMARY   Patient Name: [name] MRN: [id number] DOB:[birth year], 74 y.o., female Today's Date: 08/25/2024  PCP: Leonce Carola PARAS PA-C REFERRING PROVIDER: Geronimo Amel, MD  END OF SESSION:  End of Session - 08/25/24 1605     Visit Number 8    Number of Visits 8    Date for Recertification  09/01/24    SLP Start Time 1525    SLP Stop Time  1607    SLP Time Calculation (min) 42 min    Activity Tolerance Patient tolerated treatment well               Past Medical History:  Diagnosis Date   AICD (automatic cardioverter/defibrillator) present    [employer]   Allergy 09/20/1994   Anxiety    Arthritis    Back pain    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   Bronchiectasis (HCC)    Cerebral aneurysm, nonruptured    had a clip put in   CHF (congestive heart failure) (HCC)    Clostridium difficile infection    Depressive disorder, not elsewhere classified    Diverticulosis of colon (without mention of hemorrhage)    Esophageal reflux    Fatty liver    Fibromyalgia    Gastritis    GERD (gastroesophageal reflux disease)    GI bleed 2004   Glaucoma    Hiatal hernia    History of COVID-19 05/04/2021   Hyperlipidemia    Hypertension    Hypothyroidism    Internal hemorrhoids    Joint pain    Multifocal pneumonia 06/03/2021   Obstructive sleep apnea (adult) (pediatric)    Osteoarthritis    Ostium secundum type atrial septal defect    Other chronic nonalcoholic liver disease    Other pulmonary embolism and infarction    Palpitations    Paroxysmal ventricular tachycardia (HCC)    Personal history of radiation therapy    PONV (postoperative nausea and vomiting)    Presence of permanent cardiac pacemaker    PUD (peptic ulcer disease)    Radiation 02/03/15-03/10/15   Right Breast   Sarcoid    per pt , not sure   Schatzki's ring    Shortness of breath    with  exertion - has oxygen prn via [place] at 2 L   Sleep apnea    uses CPAP nightly   Stroke [facility]) 2013   tia/ pt feels it was around 2008 0r 2009   Takotsubo syndrome    Tubular adenoma of colon    Unspecified transient cerebral ischemia    Unspecified vitamin D  deficiency    Past Surgical History:  Procedure Laterality Date   ABDOMINAL HYSTERECTOMY     ABI  2006   normal   BONE EXOSTOSIS EXCISION Right 04/02/2024   Procedure: EXCISION, EXOSTOSIS;  Surgeon: Kit Rush, MD;  Location: [facility] OR;  Service: Orthopedics;  Laterality: Right;   BREAST EXCISIONAL BIOPSY     BREAST LUMPECTOMY Right 2016   BREAST LUMPECTOMY WITH RADIOACTIVE SEED LOCALIZATION Right 01/03/2015   Procedure: BREAST LUMPECTOMY WITH RADIOACTIVE SEED LOCALIZATION;  Surgeon: Deward Null III, MD;  Location: [facility] OR;  Service: General;  Laterality: Right;   CARDIAC DEFIBRILLATOR PLACEMENT  2006; 2012   BSX single chamber ICD   CARDIAC DEFIBRILLATOR PLACEMENT  2025   carotid dopplers  2006   neg   CEREBRAL ANEURYSM REPAIR  02/1999   COLONOSCOPY  EYE SURGERY Right 02/2023   surgery center - for cataracts   HAMMER TOE SURGERY  11/15/2020   3rd digit bilateral feet    hospitalization  2004   GI bleed, PUD, diverticulosis (EGD,colonscopy)   hospitalization     PE, NSVT, s/p defib   JOINT REPLACEMENT  05/2021   KNEE ARTHROSCOPY     bilateral   LEFT HEART CATHETERIZATION WITH CORONARY ANGIOGRAM N/A 02/22/2012   Procedure: LEFT HEART CATHETERIZATION WITH CORONARY ANGIOGRAM;  Surgeon: Lonni [name] Cash, MD;  Location: [facility] CATH LAB;  Service: Cardiovascular;  Laterality: N/A;   PARTIAL HYSTERECTOMY     Fibroids   RIGHT HEART CATH N/A 06/18/2024   Procedure: RIGHT HEART CATH;  Surgeon: Rolan Ezra RAMAN, MD;  Location: [facility] INVASIVE CV LAB;  Service: Cardiovascular;  Laterality: N/A;   TONGUE BIOPSY  12/12/2017   due to sore tongue and white patches/abnormal cells   TOTAL KNEE ARTHROPLASTY Left 02/13/2016   Procedure: TOTAL  KNEE ARTHROPLASTY;  Surgeon: Marcey Raman, MD;  Location: [facility] OR;  Service: Orthopedics;  Laterality: Left;   TOTAL KNEE ARTHROPLASTY Right 05/22/2021   Procedure: TOTAL KNEE ARTHROPLASTY;  Surgeon: Raman Marcey, MD;  Location: [facility] ORS;  Service: Orthopedics;  Laterality: Right;  with block   TUBAL LIGATION     UPPER GASTROINTESTINAL ENDOSCOPY     Patient Active Problem List   Diagnosis Date Noted   Pulmonary embolism (HCC) 04/03/2023   Acute pulmonary embolism (HCC) 04/02/2023   COVID-19 virus infection 03/12/2023   IPF (idiopathic pulmonary fibrosis) (HCC) 02/25/2023   Bicipital tendinitis of left shoulder 11/09/2022   Partial symptomatic epilepsy with complex partial seizures, not intractable, without status epilepticus (HCC) 08/10/2022   Low back pain 08/10/2022   Gait abnormality 05/04/2022   Muscle spasm 05/04/2022   Upper airway cough syndrome 01/19/2022   Chronic left shoulder pain 11/13/2021   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   Localized swelling of right lower leg 08/29/2021   Tonsillar exudate 08/29/2021   Candida vaginitis 08/28/2021   Chronic pain syndrome 08/25/2021   Multifocal pneumonia 06/03/2021   Pre-operative respiratory examination 05/18/2021   Statin myopathy 04/19/2021   Pain in joint of right shoulder 02/10/2021   Arthritis of both acromioclavicular joints 01/02/2021   Trochanteric bursitis of right hip 10/03/2020   Tailbone injury 05/05/2020   UTI (urinary tract infection) 05/05/2020   Insomnia 05/05/2020   Leg cramps 02/16/2020   Dysuria 02/16/2020   Right hip impingement syndrome 02/01/2020   Right bicipital tenosynovitis 02/01/2020   Bronchiectasis without complication (HCC) 01/22/2020   Myofascial pain 12/21/2019   Estrogen deficiency 12/10/2019   Obesity (BMI 30-39.9) 12/10/2019   Bronchiectasis with (acute) exacerbation (HCC) 10/23/2019   Elevated LFTs 10/15/2019   Lower abdominal pain 09/01/2019   Neck pain 10/25/2017   Paresthesias in left  hand 10/25/2017   Paresthesia of foot, bilateral 04/12/2017   Primary osteoarthritis of both hands 09/27/2016   Primary osteoarthritis of both knees 09/27/2016   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   Nasopharyngitis, 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/2016   S/P total knee replacement 02/13/2016   Chronic pain of both knees 12/06/2015   Globus pharyngeus 05/17/2015   Genetic testing 12/23/2014   Family history of breast cancer    Family history of colon cancer    Family history of pancreatic cancer    History of breast cancer 11/23/2014   Allergy to ACE inhibitors 09/27/2014   Prediabetes 06/23/2013   Muscle spasms of both lower extremities 01/15/2013   Hx of Clostridium  difficile infection 10/10/2012   Dyspnea on exertion 04/02/2012   NICM (nonischemic cardiomyopathy) (HCC) 03/07/2012   Post-menopausal 01/11/2012   Atypical chest pain 11/07/2011   Obstructive sleep apnea 04/04/2010   V-tach (HCC) 01/03/2009   ICD  [employer]  Single chamber 01/03/2009   PFO (patent foramen ovale) 09/29/2008   Vitamin D  deficiency 08/17/2008   Hypothyroidism 11/18/2006   Hyperlipidemia 11/18/2006   Depression with anxiety 11/18/2006   Essential hypertension 11/18/2006   Mitral valve prolapse 11/18/2006   Cerebral aneurysm 11/18/2006   Allergic rhinitis 11/18/2006   GERD 11/18/2006   Diverticulosis of colon 11/18/2006   Fatty liver 11/18/2006   Fibromyalgia 11/18/2006    Onset date: 02/18/2024  REFERRING DIAG: G15.887 (ICD-10-CM) - IPF (idiopathic pulmonary fibrosis) (HCC) Z51.81 (ICD-10-CM) - Medication monitoring encounter G47.33 (ICD-10-CM) - OSA (obstructive sleep apnea) R05.3 (ICD-10-CM) - Chronic cough Z91.09 (ICD-10-CM) - House dust mite allergy R09.89 (ICD-10-CM) - Chronic throat clearing Z86.711 (ICD-10-CM) - History of pulmonary embolism  THERAPY DIAG:  Other voice and resonance disorders  Rationale for Evaluation and Treatment: Rehabilitation  SUBJECTIVE:   SUBJECTIVE  STATEMENT: Yes, my voice problem upsets me. Pt accompanied by: self  PERTINENT HISTORY: [name] 74 y.o. -returns for follow-up.  She says she is feeling stable but she is mostly bothered by chronic cough with clearing of the throat for the last 2 months.  She states that when she is out of the house the cough is less.  She does have dust mite allergy and she says she is taking all the precautions but nevertheless when she is out of the house the cough is less. She is also clearing the throat a lot.  She is asking for interventions.  But from a shortness of breath perspective she is stable.  She is tolerating Esbriet  well.  She had pulmonary function testing that shows a continued steady decline in DLCO  PAIN:  Are you having pain? Yes: NPRS scale: 8/10 Pain location: back Pain description: ache, pain Aggravating factors: laying on stomach Relieving factors: meds  FALLS: Has patient fallen in last 6 months? No, Number of falls: 0  LIVING ENVIRONMENT: Lives with: lives alone Lives in: House/apartment  PLOF:Level of assistance: Independent with ADLs, Independent with IADLs Employment: Retired  PATIENT GOALS:   OBJECTIVE:  Note: Objective measures were completed at Evaluation unless otherwise noted.   COGNITION: Overall cognitive status: Within functional limits for tasks assessed   SOCIAL HISTORY: Occupation: Retired Water  intake: optimal Caffeine/alcohol  intake: minimal Daily voice use: minimal  PERCEPTUAL VOICE ASSESSMENT: GRBAS (Completed on 07/07/24) General- 1 Roughness - 2 Breathiness - 1 A - 1 S - 1 Voice quality: hoarse and low vocal intensity Vocal abuse: habitual throat clearing - (Has improved)  Resonance: normal Respiratory function: thoracic breathing  OBJECTIVE VOICE ASSESSMENT: Maximum phonation time for sustained ah: 9.46 Conversational loudness average: 66 dB Conversational loudness range: 62-68 dB (70dB is WNL) S/z ratio: 1.05 (Suggestive  of dysfunction >1.4)   PATIENT REPORTED OUTCOME MEASURES (PROM): V-RQOL: 22 - she rated a 3 or moderate problem: speaking loudly and being heard, feeling frustrated due to her voice, having to repeat her self to be understood and rated a 2 for not knowing what will come out when she starts speaking.  and VHI: 14/40  TREATMENT DATE:  08/25/24: Pt notes that she has been using her cough suppression/throat clearing strategies. Pt states that her new medicine has caused her to cough more due to it being an inhalant form of medicine. SLP reviewed using cough suppression strategies for decreasing throat irritation due to increased cough frequency. Pt notes that she feels that the cough suppression strategies help with her coughing frequency with her new medication. Conversation Training Therapy (CTT) was utilized to integrate pt's forward-resonance and diaphragmatic breath support for balanced phonation in conversation. During unstructured/structured conversation, pt achieved balanced phonation in 80% of opportunities given occasional min A. SLP provided cues for kinesthetic awareness to optimize pt's self-awareness of balanced phonation vs. unbalanced phonation. Pt states that she is ready for d/c. SLP had pt completed VHI-10, which displayed slight improvement in pt's perception about her voice.    07/28/24: Pt notes that things are feeling better. Pt sang in church on Sunday, and she states that she felt great. Pt has practiced a little since prior session. Pt is actively using her voice strategies during conversations on a regular basis. SLP reviewed Conversation Therapy Training (CTT) to balance breath management and forward resonance for phonation. Pt utilized forward resonance and diaphragmatic breathing during conversation in 75% of opportunities given occasional min A. Pt's voice  self-awareness continues to improve with pt noting that she can feel when her voice slips back in her throat and not forwardly placed. Pt cleared her throat 3x during session. SLP educated pt about utilizing throat clearing strategies in place of throat clearing to maximize vocal health. Pt is agreeable and displayed use of throat clearing strategies in 70% of opportunities given frequent min A. Pt notes increased coughing with one of her medications ( which requires inhaling a powder). SLP instructed pt to use rescue breathing (shhhhh followed by diaphragmatic breath) to minimize her cough for enhancing QOL. Pt is agreeable with this strategy; she will need reinforcement. Pt is making progress with voice goals and continues to display increasing self-awareness and independent maintenance of voice strategies. Plan is to conclude voice therapy by targeting CTT and reviewing throat-clearing/cough suppression strategies for optimizing vocal health and wellbeing.   07/21/24: Pt did not practice much since last session due to Thanksgiving traveling. Pt demonstrated her diaphragmatic breathing independently. Pt utilized diaphragmatic breathing during structured voice exercises and conversation with 85% consistency given frequent min A. SLP reviewed resonant voice therapy (RVT) to optimize pt's forward resonance for balancing phonation. Pt performed RVT with  /m/, words, phrases, and sentences with 85% consistency given frequent min A.  SLP reintroduced conversation therapy training (CTT) to integrate RVT and diaphragmatic breathing into conversation level. SLP focused on developing forward-resonance/kinesthetic awareness. Pt demonstrated voice self-awareness in 85% of opportunities when given occasional min A. Pt utilized forward resonance and diaphragmatic during structured conversation with 70% consistency to reduce laryngeal hyperfunction given frequent mod to min A. Pt is demonstrating improvement with  self-awarness for making adjustments to voice during conversation and structured exercises. SLP provided pt HEP focused on utilizing forward-resonance and diaphragmatic breathing during sentence readings. Plan is to continue structured voice exercises and CTT.  07/13/24: Pt noted that her voice feels slightly better this week compared to previous session. Pt states that her throat clearing has reduced significantly due to working on her voice. Pt is practicing diaphragmatic breathing at home; she notices that she feels better when she breathes diaphragmatically. Pt demonstrated her diaphragmatic breathing independently. Pt utilized diaphragmatic breathing during structured voice exercises  and conversation with 85% consistency given frequent min A. SLP reviewed resonant voice therapy (RVT) to optimize pt's forward resonance for balancing phonation. Pt performed RVT with /b/, /v/, /m/, and /k/ words with 85% consistency given frequent min A. SLP challenged pt to say /m/ and /k/ sentences to increase forward resonance for voice; pt was 80% consistent with achieving forward resonance for balanced phonation given frequent min A. SLP introduced conversation therapy training (CTT) to integrate RVT and diaphragmatic breathing into conversation level. SLP focused on establishing forward-resonance/kinesthetic awareness. Pt demonstrated voice self-awareness in 80% of opportunities when given frequent min A. Pt utilized forward resonance and diaphragmatic during structured conversation with 65% consistency to reduce laryngeal hyperfunction given frequent mod to min A. Pt is demonstrating improvement with self-awarness for making adjustments to voice during conversation and structured exercises. SLP provided pt HEP focused on RVT word production to enhance tx outcomes. Plan is to continue structured voice exercises and CTT in upcoming session.   07/07/24: Pt noted that her coughing has improved in the past few weeks. Pt  feels good about managing her chronic cough with cough suppression strategies outside of sessions. Pt states that her dysphonia is her main concern for now. SLP completed VHI-10 with pt scoring 14/40. SLP assessed pt's vocal quality using GRBAS; pt presents with a mild dysphonia characterized by increased roughness and voice use on residual capacity. Pt may benefit from a referral to ENT for objective assessment of vocal fold anatomy/physiology. SLP introduced resonant voice therapy (RVT) for addressing dysphonia. SLP educated pt about parts of voice (breathing, phonation, artic/resonance) and how they relate to RVT. Pt completed RVT at syllable level with /b, v, and m/ with 80% consistency when given occasional max to mod A. Pt performed RVT at sentence level with /m/ words with 70% consistency when provided frequent mod A. SLP reviewed SOVTEs (tongue trill) to balance phonation. Pt performed SOVTEs with /r/ words with 80% consistency when given frequent mod A.    06/22/24: [name] has been out of town for 4 weeks - She enters with hoarse voice and low volume. She has carried over alginate at night and feels it is helping. She has carried over cough suppression techniques successfully - rescues breathing and swallow - she reports cough is 50% reduced at least (subjectively). She has not completed her voice exercises since her last visit. Voice is hoarse - HEP for voice (PhoRTE) - with occasional min A to correct hoarse voice - sustained Ha with clear voice 10/10x; pitch glides with clear voice 8/10x; 10 sentences in high and low pitch with intermittent hoarseness at high pitch. Initiated training in SOVTE (semi-occluded vocal tract exercises) in water  with rare min A after initial modeling and instruction. [name] sees pulmonologist this week. She initially denied difficulty swallowing but now endorses swallowing to be a trigger for cough - will discuss this next visit, trigger for cough vs dysphagia  04/13/24: [name]  enters with low volume, (65dB) hoarse voice. She reports coughing episode night before last as she did not have C PAP on. Coughing during the day reduced - she continues on pregabalin  1x a day. [name] reports she has been reducing throat clearing and improving her awareness of throat clearing behavior. She is successfully swallowing instead of clearing her throat. Today, 1 throat clear with verbal cues to ID the throat clear. Targeted HEP for voice as clear voice achieve with high intensity vocal exercises - sustained Ha with 80-85dB for 5 seconds resulted in clear  phonation 10/10 trial and occasional modeling and verbal cues. Pitch glides up and down, again starting using ha with clear phonation and usual min modeling, gestures and verbal cues for volume and to sustain high and low pitches. Reading in high and low pitch with increased vocal intensity with occasional modeling and verbal cues. Targeted increased vocal intensity generating 3 sentence descriptions with consistent modeling reduced hoarse voice to 40% of words uttered. [name] feels too loud when she achieves WNL volume. She required consistent verbal cues and imitating to ID when she speaks on residual volume and in low glottal fry.   04/06/24: Eval completed - initiated training in throat clear awareness and throat clear avoidance. Initially she required consistent mod A to ID throat clearing behavior, which improved to occasional min verbal and visual cues (tally [name]). After initial training, [name] demonstrated throat clear suppression with hard swallow, sniffs and/or sip 3/5x with usual mod A. Initiated training in cough suppression using gentle, relaxed sniff-sniff blow pattern and intermittent swallows or sips. She did not have coughing episode this session, however she demonstrated these strategies with rare min A in isolation as practice. Education re: irritable larynx, LPR and post nasal drip provided   PATIENT EDUCATION: Education  details: See Treatment, See patient instructions, reflux education, strategies to suppress cough and throat clear, vocal hygiene Person educated: Patient Education method: Explanation, Demonstration, Verbal cues, and Handouts Education comprehension: verbalized understanding, verbal cues required, and needs further education  HOME EXERCISE PROGRAM: In progress- Home exercises for diaphragmatic breathing an RVT were introduced during this session to optimize voice outcomes.  GOALS: Goals reviewed with patient? Yes   LONG TERM GOALS: Original Target date: 06/29/24 - New target date: 09/01/2024  Pt will clear her throat 2 or less times over 2 sessions Baseline:  Goal status: MET  2.  Pt will complete HEP for voice with mod I Baseline:  Goal status: MET  3.  Pt will use strategies to suppress or prevent cough with occasional min A over 2 sessions Baseline:  Goal status: MET  4.  Pt will report 50% reduction in coughing subjectively Baseline:  Goal status: MET  5.  Pt will achieve clear phonation and WNL volume 18/20 sentences Baseline:  Goal status: DISCONTINUE   6.  Pt will maintain WNL volume and clear phonation over 8 minute conversation with rare min A Baseline:  Goal status: DEFERRED  7. Pt will utilize diaphragmatic breathing during conversation during  80% of opportunities when given rare min A.  Baseline: Pt uses thoracic breathing Goal status: DEFERRED  8. Pt will report subjective improvements with voice according to VHI-10  Baseline: 14/40 Goal status: MET (11/40)  SHORT TERM GOALS:  Target date: 08/04/24  Pt will achieve clear phonation during sentence readings with 80% consistency given occasional min A. Baseline: N/A Goal status: MET  2.   Pt will utilize diaphragmatic breathing during structured voice exercises in 80% of opportunities with occasional min A.  Baseline: Pt uses thoracic breathing Goal status: Deferred  3. Pt adhere to voice HEP across  2 sessions. Baseline: N/A Goal status: MET  4. Pt will use voice strategies during conversation in 4 minute segments with frequent min A.  Baseline: N/A Goal status: MET  ASSESSMENT:  CLINICAL IMPRESSION: (Check Tx note on 07/07/24 for updated clinical impression) Patient is a 74 y.o. female who was seen today for chronic cough and hoarse voice. She reports voice has been hoarse for about 2 months. She is referred  by pulmonologist for chronic cough and irritable larynx. She identifies perfume, scents, laughing, post nasal drip and smoke as triggers for her cough. She reports the cough in uncontrollable at times and garners unwanted attention. Others ask if she is sick or needs water . She has stopped reading at church due to voice changes and is unable to sing due to voice changes. She is on PPI in am and H2 blocker pm for GERD as well as  flonase  for nasal drip and allergies. She consistently clears her throat reporting globus sensation. She reports she has to repeat herself frequently due to voice changes. Her conversation volume is low (66dB) as well. Trials of high intensity voice exercises did result in improved voice quality indicating she is a good candidate for speech therapy. I recommend skilled ST to reduce sx of irritable larynx and improve intelligibility for safety, QOL.  OBJECTIVE IMPAIRMENTS: include voice disorder. These impairments are limiting patient from effectively communicating at home and in community. Factors affecting potential to achieve goals and functional outcome are n/a.[name] Patient will benefit from skilled SLP services to address above impairments and improve overall function.  REHAB POTENTIAL: Good  PLAN:  SLP FREQUENCY: 1-2x/week  SLP DURATION: 12 weeks  PLANNED INTERVENTIONS: Diet toleration management , Environmental controls, Internal/external aids, Functional tasks, Multimodal communication approach, SLP instruction and feedback, Compensatory strategies,  Patient/family education, [id number] Treatment of speech (30 or 45 min) , and [id number]- Speech Eval Behavioral Qualitative Voice Resonance  SPEECH THERAPY DISCHARGE SUMMARY   Visits from Start of Care: 8   Current functional level related to goals / functional outcomes: See goals above   Remaining deficits: Mild dysphonia     Education / Equipment: Cough suppression/throat clearing strategies, resonant voice strategies, diaphragmatic breathing strategies.    Patient agrees to discharge. Patient goals were partially met. Patient is being discharged due to meeting pt's personal goals for tx and satisfaction with current communication skills and supports.  Waddell Music, CF-SLP 08/25/2024, 4:14 PM      "

## 2024-08-27 ENCOUNTER — Ambulatory Visit: Payer: Self-pay | Admitting: Pharmacist

## 2024-08-27 NOTE — Telephone Encounter (Signed)
 Lipid lab and CK level discussed with the patient over the phone. Office visit scheduled for Friday Jan 16 to discuss lipid lowering therapy.

## 2024-08-28 ENCOUNTER — Telehealth: Payer: Self-pay | Admitting: Neurology

## 2024-08-28 NOTE — Telephone Encounter (Signed)
 Patient reschedule appointment due to scheduling conflict.

## 2024-08-29 ENCOUNTER — Other Ambulatory Visit: Payer: Self-pay | Admitting: Internal Medicine

## 2024-08-29 DIAGNOSIS — Z86711 Personal history of pulmonary embolism: Secondary | ICD-10-CM

## 2024-08-31 ENCOUNTER — Other Ambulatory Visit: Payer: Self-pay | Admitting: Adult Health

## 2024-09-02 ENCOUNTER — Other Ambulatory Visit: Payer: Self-pay | Admitting: Internal Medicine

## 2024-09-02 ENCOUNTER — Other Ambulatory Visit: Payer: Self-pay

## 2024-09-02 ENCOUNTER — Encounter

## 2024-09-02 DIAGNOSIS — Z5181 Encounter for therapeutic drug level monitoring: Secondary | ICD-10-CM

## 2024-09-02 DIAGNOSIS — J84112 Idiopathic pulmonary fibrosis: Secondary | ICD-10-CM

## 2024-09-02 MED ORDER — PIRFENIDONE 267 MG PO TABS
801.0000 mg | ORAL_TABLET | Freq: Three times a day (TID) | ORAL | 2 refills | Status: AC
  Filled 2024-09-02: qty 270, 30d supply, fill #0

## 2024-09-02 NOTE — Progress Notes (Signed)
 Specialty Pharmacy Refill Coordination Note  [name] is a 75 y.o. female contacted today regarding refills of specialty medication(s) Pirfenidone    Patient requested Delivery   Delivery date: 09/08/24   Verified address: [address] [place],  [place] [address]   Medication will be filled on: 09/07/24

## 2024-09-03 ENCOUNTER — Ambulatory Visit

## 2024-09-03 ENCOUNTER — Encounter: Payer: Self-pay | Admitting: Pulmonary Disease

## 2024-09-03 ENCOUNTER — Ambulatory Visit: Admitting: Internal Medicine

## 2024-09-03 ENCOUNTER — Encounter: Payer: Self-pay | Admitting: Internal Medicine

## 2024-09-03 VITALS — BP 124/76 | HR 84 | Ht 67.0 in | Wt 204.0 lb

## 2024-09-03 DIAGNOSIS — Z5181 Encounter for therapeutic drug level monitoring: Secondary | ICD-10-CM | POA: Diagnosis not present

## 2024-09-03 DIAGNOSIS — Z87891 Personal history of nicotine dependence: Secondary | ICD-10-CM | POA: Diagnosis not present

## 2024-09-03 DIAGNOSIS — I2723 Pulmonary hypertension due to lung diseases and hypoxia: Secondary | ICD-10-CM

## 2024-09-03 DIAGNOSIS — J84112 Idiopathic pulmonary fibrosis: Secondary | ICD-10-CM

## 2024-09-03 DIAGNOSIS — Z86711 Personal history of pulmonary embolism: Secondary | ICD-10-CM | POA: Diagnosis not present

## 2024-09-03 DIAGNOSIS — Z006 Encounter for examination for normal comparison and control in clinical research program: Secondary | ICD-10-CM

## 2024-09-03 DIAGNOSIS — G4733 Obstructive sleep apnea (adult) (pediatric): Secondary | ICD-10-CM

## 2024-09-03 DIAGNOSIS — R053 Chronic cough: Secondary | ICD-10-CM

## 2024-09-03 DIAGNOSIS — Z8709 Personal history of other diseases of the respiratory system: Secondary | ICD-10-CM | POA: Diagnosis not present

## 2024-09-03 LAB — CBC WITH DIFFERENTIAL/PLATELET
Basophils Absolute: 0.1 K/uL (ref 0.0–0.1)
Basophils Relative: 1.4 % (ref 0.0–3.0)
Eosinophils Absolute: 0.1 K/uL (ref 0.0–0.7)
Eosinophils Relative: 3.8 % (ref 0.0–5.0)
HCT: 44.5 % (ref 36.0–46.0)
Hemoglobin: 14.3 g/dL (ref 12.0–15.0)
Lymphocytes Relative: 23.1 % (ref 12.0–46.0)
Lymphs Abs: 0.9 K/uL (ref 0.7–4.0)
MCHC: 32.2 g/dL (ref 30.0–36.0)
MCV: 92.8 fl (ref 78.0–100.0)
Monocytes Absolute: 0.4 K/uL (ref 0.1–1.0)
Monocytes Relative: 11 % (ref 3.0–12.0)
Neutro Abs: 2.4 K/uL (ref 1.4–7.7)
Neutrophils Relative %: 60.7 % (ref 43.0–77.0)
Platelets: 214 K/uL (ref 150.0–400.0)
RBC: 4.79 Mil/uL (ref 3.87–5.11)
RDW: 13.4 % (ref 11.5–15.5)
WBC: 3.9 K/uL — ABNORMAL LOW (ref 4.0–10.5)

## 2024-09-03 LAB — PULMONARY FUNCTION TEST
DL/VA % pred: 72 %
DL/VA: 2.91 ml/min/mmHg/L
DLCO unc % pred: 43 %
DLCO unc: 9.14 ml/min/mmHg
FEF 25-75 Pre: 3.4 L/s
FEF2575-%Pred-Pre: 185 %
FEV1-%Pred-Pre: 84 %
FEV1-Pre: 2.02 L
FEV1FVC-%Pred-Pre: 121 %
FEV6-%Pred-Pre: 72 %
FEV6-Pre: 2.22 L
FEV6FVC-%Pred-Pre: 104 %
FVC-%Pred-Pre: 69 %
FVC-Pre: 2.22 L
Pre FEV1/FVC ratio: 91 %
Pre FEV6/FVC Ratio: 100 %

## 2024-09-03 LAB — COMPREHENSIVE METABOLIC PANEL WITH GFR
ALT: 33 U/L (ref 3–35)
AST: 33 U/L (ref 5–37)
Albumin: 4.3 g/dL (ref 3.5–5.2)
Alkaline Phosphatase: 130 U/L — ABNORMAL HIGH (ref 39–117)
BUN: 15 mg/dL (ref 6–23)
CO2: 30 meq/L (ref 19–32)
Calcium: 9.9 mg/dL (ref 8.4–10.5)
Chloride: 106 meq/L (ref 96–112)
Creatinine, Ser: 0.85 mg/dL (ref 0.40–1.20)
GFR: 66.78 mL/min
Glucose, Bld: 91 mg/dL (ref 70–99)
Potassium: 4.3 meq/L (ref 3.5–5.1)
Sodium: 140 meq/L (ref 135–145)
Total Bilirubin: 0.5 mg/dL (ref 0.2–1.2)
Total Protein: 7.7 g/dL (ref 6.0–8.3)

## 2024-09-03 MED ORDER — BUDESONIDE-FORMOTEROL FUMARATE 80-4.5 MCG/ACT IN AERO: 1.0000 | INHALATION_SPRAY | Freq: Two times a day (BID) | RESPIRATORY_TRACT | 4 refills | Status: AC

## 2024-09-03 NOTE — Patient Instructions (Signed)
Spiro/DLCO performed today. 

## 2024-09-03 NOTE — Research (Signed)
 Title: A Real-world Patient Registry in Group 3 Pulmonary Hypertension Associated With Interstitial Lung Disease (PH-ILD)   Principal Investigator: Dr Adina Beals  Synopsis: [facility] is a real-world patient registry study for patients with Group 3 Pulmonary Hypertension associated with Interstitial Lung Disease. The study involves three cohorts. Cohort 1 will include patients who are not receiving inhaled Treprostinil  at the time of study enrollment. Cohort 2 will include patients who are newly initiated on Tyvaso /Tyvaso  DPI (within 60 days or less). Cohort 3 will include patients who have been receiving Tyvaso /Tyvaso  DPI for greater than 60 days.  Protocol # [id number]; [id number] # [id number]  Sponsor: [employer] [address] [place], [place] [address]  Key eligibility criteria:  Inclusion criteria:  Adults aged 52 years or older, diagnosis of ILD by traditional HRCT determined by the site/institution that conducted the HRCT, patients with connective tissue disease must have a baseline FVC of less than 70%, and right heart catheterization confirmed pulmonary hypertension.   Exclusion criteria:  Confirmed diagnosis of Group 1,2,4, or 5 pulmonary hypertension, confirmed diagnosis of Group 3 pulmonary hypertension related to COPD or conditions that cause hypoxemia, and patients receiving Yutrepia (inhaled Treprostinil  at Baseline).  Key points:  Patients who enroll will be followed for up to 5 years after study enrollment. Assessments are repeated every 6 months for up to 5 years, with a 45-day window. Co-enrollment in other observational or interventional studies is permitted.  [facility] [place] Clinical Research Coordinator note:   This visit for Subject [name] with DOB: 02/17/1951 on 09/03/2024 for the above protocol is Visit/Encounter # 2 and is for purpose of research.    The Subject ID for the study is [id number]  The consent for this encounter is under Protocol  Version 25Mar2024, Investigator Brochure Version N/A, Consent Version 25Sep2024 Revised 12Jun2025 and is currently IRB approved.   Subject expressed continued interest and consent in continuing as a study subject.   Subject confirmed that there was no change in contact information (e.g. address, telephone, email). Subject thanked for participation in research and contribution to science.   The Subject was informed that the PI Dr. Donnice Beals continues to have oversight of the subject's visits and course through relevant discussions, reviews, and also specifically of this visit by routing of this note to the PI.  This CRC reviewed the interim progress notes in the electronic medical records: Yes  Since the last subject encounter at the research clinic the following were the interim events/AE/SAE as elicited from subject history and review of the interim progress notes: None    The subject's medical history and concomitant medications were reviewed and updated. Vital signs were obtained (within normal limits) and reviewed by Sub-Investigator Dr. Dorethia Cave. The patient successfully registered for the Patient Cloud application and all questionnaires were completed. Laboratory samples were collected and a 6 Minute Walk Test was performed without incident.    Signed by  Tinnie Mulberry  Clinical Research Coordinator [facility]  [place], [place] Signature Time/Date: 12:53 PM 09/03/2024

## 2024-09-03 NOTE — Patient Instructions (Addendum)
 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  - course complicated by cough with DPI =enrolled jan 2026 in DECIPHER study registry   Plan  Cotineu tyvaso  standard of care DPI - Thanks for participation in registry study - Then next step consider Phocus Pulmovant study    IPF (idiopathic pulmonary fibrosis) (HCC) Medication monitoring encounter History of abnormal liver function  -Currently tolerating Esbriet  well without any side effects although liver function test was elevated in January 2025 and sept 2025 chronically    Plan -Check liver function test today 09/03/2024 - continue esbriet  - refer [facility] at your request  History of 2019 novel coronavirus disease (COVID-19) History of pulmonary embolism April 02, 2023 Throat burning with Pradaxa   - Doing well on Eliquis    Plan -Check D-dimer today 09/03/2024 -Continue Eliquis  full doe; summer 2026 will look at reducing the dose possibly   Chronic cough 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 MALVA   Followup  -- 2 months to  to see Dr. Geronimo in a 30-minute visit after spiro/dlco  -Symptom score and exercise hypoxemia test at follow-up

## 2024-09-03 NOTE — Progress Notes (Signed)
Spiro/DLCO performed today. 

## 2024-09-03 NOTE — Progress Notes (Signed)
 "      OV 08/23/2022 -refer by Dr. Shellia to Dr. Geronimo at the pulmonary fibrosis center.  Subjective:  Patient ID: [name], female , DOB: March 18, 1951 , age 74 y.o. , MRN: [id number] , ADDRESS: [address] [place] [address] PCP Tanda Bleacher, MD Patient Care Team: Tanda Bleacher, MD as PCP - General (Family Medicine) Fernande Elspeth BROCKS, MD as PCP - Electrophysiology (Cardiology) Dann Candyce RAMAN, MD as PCP - Cardiology (Cardiology) Curvin Deward MOULD, MD as Consulting Physician (General Surgery) Lanny Callander, MD as Consulting Physician (Hematology) Keenan Hastings, MD as Consulting Physician (Radiation Oncology) Tyree Nanetta SAILOR, RN as Registered Nurse Glean Stephane BROCKS, RN as Registered Nurse Fernande Elspeth BROCKS, MD as Consulting Physician (Cardiology) Letha Truman ORN, NP (Inactive) as Nurse Practitioner (Nurse Practitioner) Moses Powell Hummer, NP as Nurse Practitioner (Nurse Practitioner) Shellia Oh, MD as Consulting Physician (Pulmonary Disease) Curvin Deward MOULD, MD as Consulting Physician (General Surgery) Dolphus Reiter, MD as Consulting Physician (Rheumatology)  This Provider for this visit: Treatment Team:  Attending Provider: Geronimo Amel, MD    08/23/2022 -   Chief Complaint  Patient presents with   Consult    Referred for ILD, states DOE     HPI [facility] 74 y.[facility]. -referred by Dr. Shellia to Dr. Geronimo the fibrosis center.  History is gained by review of the chart, talking to the patient in the ILD questionnaire that the patient fell.  There is no one accompanying the patient.  She has been following with Dr. Shellia at least for the last 3 to 4 years for a diagnosis of bronchiectasis.  She says at baseline she is doing fairly well but then in October 2022 she suffered from COVID-19 and then was hospitalized.  Review of the records indicate that she was hospitalized for total knee replacement of the right knee early August 2022 for 1  day.  Then a few weeks later on 06/03/2021 she was hospitalized for worsening respiratory failure which she believes was because of COVID-19 but review the records indicate that she actually had COVID in September 2022 and was treated with Paxlovid  as an outpatient.  At this hospitalization the COVID was negative.  She was actually hypoxemic.  Pulmonary embolism was ruled out.  This admission RVP and COVID were normal.  Procalcitonin was normal.  Final diagnosis was acute on chronic hypoxemic respiratory failure due to bronchiectasis and pulmonary fibrosis.  She says since then she has been gradually weaned of oxygen.  She says overall she is better since the hospitalization October 2022 but she is not back to her baseline from before the hospitalization.  She has dyspnea on exertion.  Most recently November 2023 Dr. Shellia did high-resolution CT scan of the chest that is concerning for honeycombing findings and progressive changes [personally visualized and I do not fully agree].  Therefore she has been referred here.  Pulmonary function testing shows still blood in DLCO but the FVC seems reduced recently.   All the question is as follows  Most recently she is use 2 L of oxygen with exertion.   [place] Integrated Comprehensive ILD Questionnaire  Symptoms:  Other than dyspnea she also has a cough.  The cough is a since December 2023.  Although currently it is not there.   Past Medical History :  -She marked positive for COPD according to history but does not not be correct. -She is have OSA for which she is previous Dr. Shellia - She  does have a longstanding history of acid reflux/hiatal hernia No history of tuberculosis -She has a history of breast cancer with radiation to the right breast in 2018 -Per the records she suffers from chronic pain syndrome with a history of right brain aneurysm clipping 2001 and cervical stenosis. -She has acid reflux disease for which she sees Dr. Albertus -She has  overactive bladder -She has nonischemic cardiomyopathy for which she sees Dr. Fernande  -EF 35-40% in 2013 but as high as 50-55% in December 2022   ROS:  She has longstanding chronic pain and gait abnormality  FAMILY HISTORY of LUNG DISEASE:  She has arthralgia - She does have dry eyes -She does have heartburn -Denies family history of lung disease  PERSONAL EXPOSURE HISTORY:  -Denies family history of lung disease -She smoked between 1966 in 41 1/2 pack/day.  No marijuana no cocaine no vaping  HOME  EXPOSURE and HOBBY DETAILS :  -Her current home was built in 1973 and she has been in this house since the last 25 years.  She does use a CPAP mask.  She does use a nebulizer machine.  She does do some occasional gardening but otherwise detailed organic antigen history is negative.  OCCUPATIONAL HISTORY (122 questions) : She is to work for [employer] and then got disabled and then retired.  Detail organic and inorganic antigen history exposure at work is negative.  PULMONARY TOXICITY HISTORY (27 items):  -Did get rid Dacian to the right breast in 2018  INVESTIGATIONS: -     CT Chest data - HRCT Nov 2023 -personally visualized.  Not fully sure if that is actually UIP or not.  If that is UIP then that negative prognostic marker.  I am also not sure about progression.  With this allowed to be clarified with multidisciplinary case conference.   Narrative & Impression  CLINICAL DATA:  74 year old female with history of shortness of breath. Evaluate for interstitial lung disease.   EXAM: CT CHEST WITHOUT CONTRAST   TECHNIQUE: Multidetector CT imaging of the chest was performed following the standard protocol without intravenous contrast. High resolution imaging of the lungs, as well as inspiratory and expiratory imaging, was performed.   RADIATION DOSE REDUCTION: This exam was performed according to the departmental dose-optimization program which includes automated exposure  control, adjustment of the mA and/or kV according to patient size and/or use of iterative reconstruction technique.   COMPARISON:  CTA of the chest 08/30/2021.   FINDINGS: Cardiovascular: Heart size is normal. There is no significant pericardial fluid, thickening or pericardial calcification. There is aortic atherosclerosis, as well as atherosclerosis of the great vessels of the mediastinum and the coronary arteries, including calcified atherosclerotic plaque in the left anterior descending coronary artery. Left-sided pacemaker/AICD device in place with lead tip terminating in the right ventricular apex.   Mediastinum/Nodes: No pathologically enlarged mediastinal or hilar lymph nodes. Please note that accurate exclusion of hilar adenopathy is limited on noncontrast CT scans. Esophagus is unremarkable in appearance. No axillary lymphadenopathy.   Lungs/Pleura: High-resolution images demonstrate widespread but patchy areas of ground-glass attenuation, septal thickening, subpleural reticulation, parenchymal banding, traction bronchiectasis, peripheral bronchiolectasis and extensive honeycombing. These findings have a definitive craniocaudal gradient and are progressive compared to the prior examination from 08/30/2021. Inspiratory and expiratory imaging is unremarkable. No acute confluent consolidative airspace disease. No pleural effusions. No definite suspicious appearing pulmonary nodules or masses are noted.   Upper Abdomen: Aortic atherosclerosis. Colonic diverticulosis noted in the splenic flexure of the  colon.   Musculoskeletal: There are no aggressive appearing lytic or blastic lesions noted in the visualized portions of the skeleton.   IMPRESSION: 1. 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 2. Aortic atherosclerosis, in addition to left  anterior descending coronary artery disease. Assessment for potential risk factor modification, dietary therapy or pharmacologic therapy may be warranted, if clinically indicated. 3. Colonic diverticulosis without evidence of acute diverticulitis at this time.   Aortic Atherosclerosis (ICD10-I70.0).     Electronically Signed   By: Toribio Aye M.D.   On: 07/08/2022 10:54     OV 11/22/2022  Subjective:  Patient ID: [name], female , DOB: [birth year] , age 47 y.o. , MRN: [id number] , ADDRESS: [address] [place] [address] PCP Tanda Bleacher, MD Patient Care Team: Tanda Bleacher, MD as PCP - General (Family Medicine) Fernande Elspeth BROCKS, MD as PCP - Electrophysiology (Cardiology) Dann Candyce RAMAN, MD as PCP - Cardiology (Cardiology) Curvin Deward MOULD, MD as Consulting Physician (General Surgery) Lanny Callander, MD as Consulting Physician (Hematology) Keenan Hastings, MD as Consulting Physician (Radiation Oncology) Tyree Nanetta SAILOR, RN as Registered Nurse Glean Stephane BROCKS, RN as Registered Nurse Fernande Elspeth BROCKS, MD as Consulting Physician (Cardiology) Letha Truman ORN, NP (Inactive) as Nurse Practitioner (Nurse Practitioner) Moses Powell Hummer, NP as Nurse Practitioner (Nurse Practitioner) Shellia Oh, MD as Consulting Physician (Pulmonary Disease) Curvin Deward MOULD, MD as Consulting Physician (General Surgery) Dolphus Reiter, MD as Consulting Physician (Rheumatology)  This Provider for this visit: Treatment Team:  Attending Provider: Geronimo Amel, MD    11/22/2022 -   Chief Complaint  Patient presents with   Follow-up    F/u pft 11/15/22. Labs,      HPI Lakethia Coppess 74 y.o. -returns for follow-up.  This is potential ILD workup in progress.  She states in the interim her son who is age 34 did have a heart transplant for congestive heart failure 3 weeks ago at [facility] he is home and is improving.  She is happy about that.  She  also reports that after her COVID she was prescribed Symbicort  she is run out of the Symbicort  for the last 2 weeks but this is not really bothering her.  She feels stable she has occasional cough.  Be here to review the results.  She had pulmonary function test that shows now a consistent pattern of decline her FVC is declined 9% in the last 4 years.  I went back and visualized all her CT scans of the chest.  Her CT scan in 2013 and a high-resolution CT chest in 2020 look about similar with some bronchiectasis.  However there is dramatic change between 2020 and also the high-resolution CT chest and late 2023.  This seems a lot more traction bronchiectasis.  There is definite craniocaudal gradient.  She has crackles here.  Initially I was not sure if this was classic UIP but clinically this makes sense especially of the lower lobe crackles.  Was supposed to discuss this in the case conference but this did not happen.  Every posted for discussion of the case conference but nevertheless his decline in PFTs and also this change now in the CT scan of the chest.  In the interim his serologies are normal.  I think at the least she has some plan for progressive ILD .  Possible is IPF but I would like to confirm  this and discussion of the case conference.  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  Went over the 2 antifibrotic's nintedanib and pirfenidone .  Shows pirfenidone  because of the diarrhea side effects with nintedanib.  She has knee pain and be hard to get to the toilet.  In addition she has nonischemic cardiomyopathy and this blackbox warning for MI risk with with nintedanib.  [Even though she does not have ischemic cardiomyopathy].  Told her that pirfenidone  associated more with weight loss nausea anorexia.  Both drugs required liver function monitoring.  Generally both drugs have reversible side effects.  In balance which was  pirfenidone .  I did note mild transaminitis in the past will check liver function test again today.   [id number]      OV 01/03/2023  Subjective:  Patient ID: [name], female , DOB: January 23, 1951 , age 42 y.o. , MRN: [id number] , ADDRESS: [address] [place] [address] PCP Tanda Bleacher, MD Patient Care Team: Tanda Bleacher, MD as PCP - General (Family Medicine) Fernande Elspeth BROCKS, MD as PCP - Electrophysiology (Cardiology) Dann Candyce RAMAN, MD as PCP - Cardiology (Cardiology) Curvin Deward MOULD, MD as Consulting Physician (General Surgery) Lanny Callander, MD as Consulting Physician (Hematology) Keenan Hastings, MD as Consulting Physician (Radiation Oncology) Tyree Nanetta SAILOR, RN as Registered Nurse Glean Stephane BROCKS, RN as Registered Nurse Fernande Elspeth BROCKS, MD as Consulting Physician (Cardiology) Letha Truman ORN, NP (Inactive) as Nurse Practitioner (Nurse Practitioner) Moses Powell Hummer, NP as Nurse Practitioner (Nurse Practitioner) Shellia Oh, MD as Consulting Physician (Pulmonary Disease) Curvin Deward MOULD, MD as Consulting Physician (General Surgery) Dolphus Reiter, MD as Consulting Physician (Rheumatology)  This Provider for this visit: Treatment Team:  Attending Provider: Geronimo Amel, MD    01/03/2023 -   Chief Complaint  Patient presents with   Follow-up    F/up wheezing, congestion, cough, sob with exertion.     HPI Nautika Cressey 74 y.o. -returns for follow-up.  At this visit she was supposed to have started pirfenidone  but what happened is that she had abnormal liver enzymes at the time of last visit.  We found that she was taking a lot of Tylenol  for the knee pain.  She is now saying she did not take a lot but she actually did.  Her liver enzymes have since normalized as of December 07, 2022.  She is concerned about a high total protein.  She wanted to discuss this.  I did recommend to her that she  discuss this with primary care physician.  Then she brought her back pain.  I also suggested she discuss with primary care physician.  She wants repeat liver enzymes check especially because of total protein which she believes is because of the Repatha .  Meanwhile she continues to be symptomatic.  We discussed in the case conference in the results are below.  I did share these results with the patient.     MDD conference Ronal 2024: Definitely progressive 2013 very mild non specific ILD. 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 NOv 2023: most rcent versus Feb 2020 HRCT. In msost recent Ct  basilar predmon, ILD, Subpleural reticuation with prominent TB. There is clear honeycombing (moderate) in lung base. No air trapping. Progressed sinc2020.  C/w UIP. In 2020: read as alternate diagnosis but Dr Leonce thinks in retrospect is mild to moderate  probable or c/w UIP with mild honeycombing. The bronchiectasis  all traction bronchiectasis of fibrosis does not need bipsy. Call it IPF. Rx as IPF   We then discussed the antifibrotic's again.  We went over nintedanib or pirfenidone  again.  We stuck with the original decision on taking pirfenidone  and she is committing to it.  I will send a note to pharmacy.  Will check liver function test today.  03/12/2023 Acute OV : Covid 19 infection  Presents for an acute office visit.  She complains of 1 week of cough, congestion, increased shortness of breath.  She tested positive for COVID-19 on March 08, 2023.  She was treated with a Z-Pak.  She has completed this in full.  Continues to have some ongoing mucus that is thick.  Denies any fever, hemoptysis, chest pain.  She remains on Symbicort  twice daily. She is feeling some better. Concerned that her mucus is still thick. Has nasal congestion and drainage. Still feels weak. Appetite is okay, no n/v/d.   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 She was started on Esbriet  but unable to start due to elevated liver enzymes . Was  seen by GI diagnosed with fatty liver. Has follow up next month with Dr. Geronimo for ILD.   Has sleep apnea on CPAP At bedtime . Does not wear oxygen at home.        OV 03/29/2023  Subjective:  Patient ID: [name], female , DOB: 03-08-1951 , age 75 y.o. , MRN: [id number] , ADDRESS: [address] [place] [address] PCP Lorren Greig PARAS, NP Patient Care Team: Lorren Greig PARAS, NP as PCP - General (Nurse Practitioner) Fernande Elspeth BROCKS, MD as PCP - Electrophysiology (Cardiology) Dann Candyce RAMAN, MD as PCP - Cardiology (Cardiology) Curvin Deward MOULD, MD as Consulting Physician (General Surgery) Lanny Callander, MD as Consulting Physician (Hematology) Keenan Hastings, MD as Consulting Physician (Radiation Oncology) Tyree Nanetta SAILOR, RN as Registered Nurse Glean Stephane BROCKS, RN as Registered Nurse Fernande Elspeth BROCKS, MD as Consulting Physician (Cardiology) Letha Truman ORN, NP (Inactive) as Nurse Practitioner (Nurse Practitioner) Moses Powell Hummer, NP as Nurse Practitioner (Nurse Practitioner) Shellia Oh, MD as Consulting Physician (Pulmonary Disease) Curvin Deward MOULD, MD as Consulting Physician (General Surgery) Dolphus Reiter, MD as Consulting Physician (Rheumatology)  This Provider for this visit: Treatment Team:  Attending Provider: Geronimo Amel, MD    03/29/2023 -   Chief Complaint  Patient presents with   Acute Visit    Cough but not productive, runny nose.     HPI Yesica Kemler 74 y.o. -acute visit for this IPF patient who has not been able to start antifibrotic's because of elevated liver enzymes.  Since her last visit she did see Dr. Sheran Phoenix who could not find an identifiable etiology for the elevated liver enzymes advised a low carbohydrate diet which she has been following.  She comes in because she is acutely ill.  She tells me that she did see Madelin Passy March 12, 2023 leory reviewed] had acute COVID-19..  Now she is calling  acutely for cough.  She says cough is getting worse for the last 2 days.  This headaches are some back pressure there is also shortness of breath for 2 days she feels some chills but no fever no sputum production.  Therefore she is here.  She does use oxygen at night.  In the past have tried to do excess hypoxemia test but unable to because of her knee issue.  Today she was able to do and she did  desaturate to 81% on a sit/stand test after doing it 10 times.  But I do not have a baseline to compare to.   TEST/EVENTS :  PFT 04/17/12 >> FEV1 2.60 (110%), FEV1% 85, TLC 4.05 (77%), DLCO 44% PFT 01/17/17 >> FEV1 2.38 (111%), FEV1% 90, TLC 3.85 (71%), DLCO 58%, no BD RAST 05/30/17 >> dust mites PFT 10/16/18 >> FEV1 2.27 (111%), FEV1% 90, TLC 4.00 (74%), DLCO 59% PFT 06/08/22 >> FEV1 2.20 (91%), FEV1% 93, TLC 3.76 (69%), DLCO 62%   Serology:  04/02/12 >> ACE 1 10/25/15 >> ESR 40, ACE 32 05/30/17 >> IgA, IgG, IgM normal 10/16/18 >> HP panel negative, ACE 25   Chest Imaging:  CT chest 04/10/12 >> b/l lower lobe cylindrical BTX and some in upper lobes CT angio chest 10/26/15 >> patchy GGO in periphery of lungs b/l, basilar BTX, no PE; no significant change compared to 2013 HRCT chest 10/08/18 >> atherosclerosis, basilar predominant cylindrical BTX, mild centrilobular/paraseptal emphysema, air trapping CT angio chest 03/24/19 >> Chronic lung changes with peribronchial thickening, bibasilar atelectasis and chronic interstitial disease/peripheral fibrosis at the lung bases CT angio chest 06/03/21 >> scattered GGO with fibrotic changes CT angio chest 08/30/21 >> BTX with emphysema and fibrotic changes in periphery HRCT 06/2022 -Progressive ILD-dx of UIP    Sleep Tests:  PSG 11/29/08 >> AHI 9 HST 11/10/15 >> AHI 7.1, SaO2 low 75% CPAP 06/17/21 to 07/16/21 >> used on 27 of 30 nights with average 5 hrs 24 min.  Average AHI 2.5 with CPAP 5 cm H2O.   Cardiac Tests:  Echo 08/11/21 >> EF 50 to 55%, RVSP 39.5  mmHg  PFT  OV 04/11/2023  Subjective:  Patient ID: [name], female , DOB: 24-Apr-1951 , age 5 y.o. , MRN: [id number] , ADDRESS: [address] [place] [address] PCP Lorren Greig PARAS, NP Patient Care Team: Lorren Greig PARAS, NP as PCP - General (Nurse Practitioner) Fernande Elspeth BROCKS, MD as PCP - Electrophysiology (Cardiology) Dann Candyce RAMAN, MD as PCP - Cardiology (Cardiology) Curvin Deward MOULD, MD as Consulting Physician (General Surgery) Lanny Callander, MD as Consulting Physician (Hematology) Keenan Hastings, MD as Consulting Physician (Radiation Oncology) Tyree Nanetta SAILOR, RN as Registered Nurse Glean Stephane BROCKS, RN as Registered Nurse Fernande Elspeth BROCKS, MD as Consulting Physician (Cardiology) Letha Truman ORN, NP (Inactive) as Nurse Practitioner (Nurse Practitioner) Moses Powell Hummer, NP as Nurse Practitioner (Nurse Practitioner) Shellia Oh, MD as Consulting Physician (Pulmonary Disease) Curvin Deward MOULD, MD as Consulting Physician (General Surgery) Dolphus Reiter, MD as Consulting Physician (Rheumatology)  This Provider for this visit: Treatment Team:  Attending Provider: Geronimo Amel, MD    04/11/2023 -   Chief Complaint  Patient presents with   Follow-up    F/up on hosp visit, IPF and blood work   HPI [employer] 74 y.o. -returns for follow-up.  She was seen acutely in July 2020 for outpatient COVID then in August 2024 she came in acutely what look like bronchitis ended up turning out to be pulmonary embolism.  She did get admitted.  She has been discharged on Pradaxa  because Eliquis  was expensive.  She is using oxygen 2 L nasal cannula she feels better but he does get tachycardic.  She is worried about the PE.  She is worried about the duration of Pradaxa  and the cost associated with it and previous bleeding risk she says she had a bleeding episode 20 years ago.  I did indicate to her that potentially lifelong  treatment because a second  episode versus at least taking for a year and then reassessing.  In terms of IPF LFTs most recently mid August 2024 are normal.  She is agreed to start pirfenidone .  We did a sit/stand hypoxemia test and she did desaturate again.  I did indicate to her this might not be the PE make her desaturate but the IPF.  She asked about transplant I did indicate to her the 42 is at the upper limit plus she has got obesity.  Did indicate to her that we need to make progress with weight loss and then subsequently we went with the basics of IPF management and then we can decide.  She is using nighttime oxygen on her own.  But is never been tested.  Sleep study was ordered before she saw Dr. MALVA but because of illness this was canceled.  We will reorder this.  OV 05/23/2023  Subjective:  Patient ID: [name], female , DOB: Oct 02, 1950 , age 1 y.o. , MRN: [id number] , ADDRESS: [address] [place] [address] PCP Lorren Greig PARAS, NP Patient Care Team: Lorren Greig PARAS, NP as PCP - General (Nurse Practitioner) Fernande Elspeth BROCKS, MD as PCP - Electrophysiology (Cardiology) Dann Candyce RAMAN, MD as PCP - Cardiology (Cardiology) Curvin Deward MOULD, MD as Consulting Physician (General Surgery) Lanny Callander, MD as Consulting Physician (Hematology) Keenan Hastings, MD as Consulting Physician (Radiation Oncology) Tyree Nanetta SAILOR, RN as Registered Nurse Glean Stephane BROCKS, RN as Registered Nurse Fernande Elspeth BROCKS, MD as Consulting Physician (Cardiology) Letha Truman ORN, NP (Inactive) as Nurse Practitioner (Nurse Practitioner) Moses Powell Hummer, NP as Nurse Practitioner (Nurse Practitioner) Shellia Oh, MD (Inactive) as Consulting Physician (Pulmonary Disease) Curvin Deward MOULD, MD as Consulting Physician (General Surgery) Dolphus Reiter, MD as Consulting Physician (Rheumatology)  This Provider for this visit: Treatment Team:  Attending Provider: Geronimo Amel, MD    05/23/2023 -   Chief  Complaint  Patient presents with   Followup     She has occ dizzy spells- occurs while at rest. She feels like her breathing has improved since the last visit. She notices rattling in chest at night. Her cough is mainly non prod.      HPI Marlene Beidler 74 y.o. 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  She finally got a pirfenidone  mid September 2024 based on chart review.  Today she goes up to 3 pills 3 times daily.  She initially said she was tolerating pirfenidone  well but then she admitted that ever since she started the pirfenidone  she is getting dizzy spells.  These are very transient split-second.  They happen spontaneously.  2-3 times a day.  Could be standing or sitting or lying down.  She does not lose any consciousness.  No focal deficit no seizures no falls no double vision no fever no chills but definitely new definitely mild and definitely since starting pirfenidone .  She is not compliant with her oxygen use.  However her exercise hypoxemia test is better in fact her overall shortness of breath with exertion is better. Want to rehab  # Pulmonary embolism August 2024 she continues Pradaxa .  She tells me now for the first time that ever since starting Pradaxa  she is having intense throat burning despite her being on PPI and H2 blockade.  At this straight after taking Pradaxa  does not think that with pirfenidone .  Review of Pradaxa  side effects is that greater than 25% GI  side effects.  She could not afford Eliquis  but she is willing to see if this is now affordable.  Meanwhile we will give her a GI cocktail.  I have sent a message to the pharmacy team  #Sleep apnea she is under the care of Dr. MALVA   #Other issues.  She will have a high-dose flu shot today.  I also told her that is okay to travel by air.     APP VISIT 06/21/23   History of Present Illness   Today's video visit is 1 month follow-up.  She is followed for IPF.  She has been started on  Esbriet  but during last visit was having dizziness felt possibly secondary to Esbriet .  Esbriet  was placed on hold until October 12.  She was to restart Esbriet  and titrate up slowly as tolerable.  She was also referred to pulmonary rehab.  She would also recommend to continue on oxygen 2 L with activity The patient had previously been advised to stop taking Esbriet  due to concerns it was causing dizziness. The patient confirmed that the dizziness resolved after discontinuing the medication, but was unaware of the instruction to restart the medication on October 13th. The patient had previously reached the top dose of Esbriet  (three capsules three times daily) . Dizziness did start at lower doses.   In addition to fibrosis, the patient was also dealing with issues related to Pradaxa , a blood thinner, which was causing throat burning. The patient confirmed that switching to Eliquis  resolved this issue.  She denies any known bleeding . Education on not using NSAIDS.   The patient is using oxygen with activity and during physical therapy, as her oxygen level sometimes dropped to as low as 88. The patient is taking Symbicort  twice daily and using albuterol  infrequently. The patient was not very active outside of physical therapy and housework. Likes pulmonary rehab alot.   The patient is  also using a CPAP machine at bedtime and reported feeling rested with its use. The patient had recently been in the ER for chest pain . Workup was unrevealing with neg CT chest for PE or acute process. Labs unrevealing. plans to follow up with cardiology .    The patient had received a pneumonia vaccine in 2022 and was up to date on RSV vaccines.        OV 10/28/2023  Subjective:  Patient ID: [name], female , DOB: May 14, 1951 , age 26 y.o. , MRN: [id number] , ADDRESS: [address] [place] [address] PCP Leonce Carola PARAS, PA-C Patient Care Team: Leonce Carola PARAS DEVONNA as PCP - General  (Pediatrics) Fernande Elspeth BROCKS, MD as PCP - Electrophysiology (Cardiology) Dann Candyce RAMAN, MD as PCP - Cardiology (Cardiology) Curvin Deward MOULD, MD as Consulting Physician (General Surgery) Lanny Callander, MD as Consulting Physician (Hematology) Keenan Hastings, MD as Consulting Physician (Radiation Oncology) Tyree Nanetta SAILOR, RN as Registered Nurse Glean Stephane BROCKS, RN as Registered Nurse Fernande Elspeth BROCKS, MD as Consulting Physician (Cardiology) Letha Truman ORN, NP (Inactive) as Nurse Practitioner (Nurse Practitioner) Moses Powell Hummer, NP as Nurse Practitioner (Nurse Practitioner) Shellia Oh, MD (Inactive) as Consulting Physician (Pulmonary Disease) Curvin Deward MOULD, MD as Consulting Physician (General Surgery) Dolphus Reiter, MD as Consulting Physician (Rheumatology)  This Provider for this visit: Treatment Team:  Attending Provider: Geronimo Amel, MD    10/28/2023 -   Chief Complaint  Patient presents with   Follow-up    She c/o wheezing, esp at night x  2 months. Breathing has been stable. She has nocturnal cough- non prod.     HPI Herman Mell 74 y.o. -returns for follow-up.  I last saw her in the fall 2024.  At that time she had some dizziness with pirfenidone  we stopped the pirfenidone  and then she saw a nurse practitioner.  Nurse practitioner put her on a rechallenge of pirfenidone .  She is currently on full dose pirfenidone  and she is tolerating it well without any dizziness.  She reports stable dyspnea.  She reports good tolerance with pirfenidone  except mild nausea that is transient.  She had pulmonary function test in March 2025 and it is stable.  Her main issue is that she is noticing nocturnal wheeze when she lies down and is in the sofa or in the bed.  However when I played the sound of her wheezing and crackles on YouTube she reported that it was crackles.  She had been reassured by the sleep specialist that this was consistent with the pulmonary  fibrosis.  I shared the same opinion.  However lab review shows that in the past in 2018 she had house dust mite allergy.  She is also on Singulair , Symbicort  and antihistamines at this point.  I did indicate to her we will check a CBC differential and blood IgE.  Also gave advice on house dust mite control.  Of note in January 2025 she ended up in the ER with enteritis and had elevated liver enzyme.  She requires a follow-up today.  Also of note she wants to know if she can drink alcohol .  I did tell her that we need the liver enzymes checked.  She is also on oxycodone .  I told her that multiple agents that can increase risk of liver injury.  She wanted to know if she could to a marijuana gummy I advised her against this.      OV 02/18/2024  Subjective:  Patient ID: [name], female , DOB: 1951-04-05 , age 23 y.o. , MRN: [id number] , ADDRESS: [address] [place] [address] PCP Leonce Carola PARAS, PA-C Patient Care Team: Leonce Carola PARAS DEVONNA as PCP - General (Pediatrics) Cindie Ole DASEN, MD as PCP - Electrophysiology (Cardiology) Curvin Deward MOULD, MD as Consulting Physician (General Surgery) Lanny Callander, MD as Consulting Physician (Hematology) Keenan Hastings, MD as Consulting Physician (Radiation Oncology) Tyree Nanetta SAILOR, RN as Registered Nurse Glean Stephane BROCKS, RN (Inactive) as Registered Nurse Fernande Elspeth BROCKS, MD as Consulting Physician (Cardiology) Letha Truman ORN, NP (Inactive) as Nurse Practitioner (Nurse Practitioner) Moses Powell Hummer, NP as Nurse Practitioner (Nurse Practitioner) Shellia Oh, MD (Inactive) as Consulting Physician (Pulmonary Disease) Curvin Deward MOULD, MD as Consulting Physician (General Surgery) Dolphus Reiter, MD as Consulting Physician (Rheumatology)  This Provider for this visit: Treatment Team:  Attending Provider: Geronimo Amel, MD    02/18/2024 -   Chief Complaint  Patient presents with   Follow-up    Breathing  is stable. She does c/o increased cough for the past 6-8 wks- non prod.    HPI Bethania Schlotzhauer 74 y.o. -returns for follow-up.  She says she is feeling stable but she is mostly bothered by chronic cough with clearing of the throat for the last 2 months.  She states that when she is out of the house the cough is less.  She does have dust mite allergy and she says she is taking all the precautions but nevertheless when she is out of the house the  cough is less.  She is also clearing the throat a lot.  She is asking for interventions.  But from a shortness of breath perspective she is stable.  She is tolerating Esbriet  well.  She had pulmonary function testing that shows a continued steady decline in DLCO.  Echocardiogram June 2025 with her pulmonary hypertension.  Last high-res CT was November 2023.  Last CT angiogram chest was August 2024 at the time of pulmonary embolism [post COVID] in terms of PE she is continuing her full dose Eliquis  without any problems 1 year since anticoagulation is coming up.  Despite the DLCO showing decline in her symptom burden and her excess hypoxemia test actually stable.  She is willing to get a high-resolution CT chest for monitoring purposes.  She will also need a D-dimer and BNP to assess for anticoagulation monitoring and also any evidence of pulmonary hypertension.  New issue is that her right toe is black.  She has seen the podiatrist and a biopsy is pending she wanted clearance.        OV 04/23/2024  Subjective:  Patient ID: [name], female , DOB: 1950-09-28 , age 17 y.o. , MRN: [id number] , ADDRESS: [address] [place] [address] PCP Leonce Carola PARAS, PA-C Patient Care Team: Leonce Carola PARAS DEVONNA as PCP - General (Pediatrics) Cindie Ole DASEN, MD as PCP - Electrophysiology (Cardiology) Curvin Deward MOULD, MD as Consulting Physician (General Surgery) Lanny Callander, MD as Consulting Physician (Hematology) Keenan Hastings, MD as  Consulting Physician (Radiation Oncology) Tyree Nanetta SAILOR, RN as Registered Nurse Glean Stephane BROCKS, RN (Inactive) as Registered Nurse Fernande Elspeth BROCKS, MD as Consulting Physician (Cardiology) Letha Truman ORN, NP (Inactive) as Nurse Practitioner (Nurse Practitioner) Moses Powell Hummer, NP as Nurse Practitioner (Nurse Practitioner) Shellia Oh, MD (Inactive) as Consulting Physician (Pulmonary Disease) Curvin Deward MOULD, MD as Consulting Physician (General Surgery) Dolphus Reiter, MD as Consulting Physician (Rheumatology)  This Provider for this visit: Treatment Team:  Attending Provider: Geronimo Amel, MD    04/23/2024 -   Chief Complaint  Patient presents with   Medical Management of Chronic Issues   Interstitial Lung Disease    Breathing has been stable. No new co's.      HPI Greidys Deland 74 y.o. -returns for follow-up.  Since her last visit she has had toe surgery and for her history no malignancy was found.  She did not have any excision of the toe.  Reviewed medical records indicate she had this on April 02, 2024.  Otherwise no changes in symptoms.  Tolerating pirfenidone  well.  The main question at this visit is if she is having pulmonary hypertension or worsening pulmonary fibrosis.  She did have an echocardiogram in June 2025 that showed evidence of pulmonary hypertension.  In addition this is a significant drop in DLCO.  Both parameters suggest pulmonary hypertension but the BNP at last visit was normal.  Without a CT scan therefore.  I personally visualized the this also enlarged pulmonary trace.  Therefore the concern is both worsening pulmonary fibrosis and onset of pulmonary hypertension.    CT Chest data from date: 04/13/24  - personally visualized and independently interpreted : ye - my findings are: below Narrative & Impression  CLINICAL DATA:  Pulmonary fibrosis.   EXAM: CT CHEST WITHOUT CONTRAST   TECHNIQUE: Multidetector CT imaging of the chest  was performed following the standard protocol without intravenous contrast. High resolution imaging of the lungs, as well as inspiratory and  expiratory imaging, was performed.   RADIATION DOSE REDUCTION: This exam was performed according to the departmental dose-optimization program which includes automated exposure control, adjustment of the mA and/or kV according to patient size and/or use of iterative reconstruction technique.   COMPARISON:  06/17/2023, 07/06/2022.   FINDINGS: Cardiovascular: Atherosclerotic calcification of the aorta and coronary arteries. Enlarged pulmonic trunk and heart. No pericardial effusion.   Mediastinum/Nodes: Thoracic inlet lymph nodes are not enlarged by CT size criteria. No pathologically enlarged mediastinal or axillary lymph nodes. Hilar regions are difficult to definitively evaluate without IV contrast. Lumpectomy clips in the right breast. Esophagus is grossly unremarkable.   Lungs/Pleura: Peripheral and basilar predominant subpleural reticulation, coarsened ground-glass and traction bronchiectasis/bronchiolectasis with basilar honeycombing, stable to minimally progressive from 06/17/2023 and clearly progressive from 07/06/2022. No pleural fluid. Airway is unremarkable. Mild air trapping.   Upper Abdomen: Visualized portions of the liver, adrenal glands, kidneys, spleen, pancreas, stomach and bowel are grossly unremarkable. No upper abdominal adenopathy.   Musculoskeletal: Degenerative changes in the spine.   IMPRESSION: 1. Pulmonary parenchymal pattern of interstitial lung disease, as detailed above, similar to minimally progressive from 06/17/2023 and definitely progressive from 07/06/2022. Findings are consistent with UIP per consensus guidelines: Diagnosis of Idiopathic Pulmonary Fibrosis: An Official ATS/ERS/JRS/ALAT Clinical Practice Guideline. Am JINNY Honey Crit Care Med Vol 198, Iss 5, [phone], Apr 20 2017. 2. Aortic  atherosclerosis (ICD10-I70.0). Coronary artery calcification. 3. Enlarged pulmonic trunk, indicative of pulmonary arterial hypertension.     Electronically Signed   By: Newell Eke M.D.   On: 04/14/2024 17:26      ECHO June 2025   IMPRESSIONS     1. Left ventricular ejection fraction, by estimation, is 45 to 50%. The  left ventricle has mildly decreased function. The left ventricle  demonstrates global hypokinesis. There is mild concentric left ventricular  hypertrophy. Left ventricular diastolic  parameters are consistent with Grade I diastolic dysfunction (impaired  relaxation).   2. Right ventricular systolic function is normal. The right ventricular  size is normal. There is mildly elevated pulmonary artery systolic  pressure.   3. The mitral valve is normal in structure. Trivial mitral valve  regurgitation. No evidence of mitral stenosis.   4. The aortic valve is tricuspid. Aortic valve regurgitation is not  visualized. No aortic stenosis is present.   5. The inferior vena cava is normal in size with greater than 50%  respiratory variability, suggesting right atrial pressure of 3 mmH  OV 06/24/2024  Subjective:  Patient ID: [name], female , DOB: 15-Jan-1951 , age 45 y.o. , MRN: [id number] , ADDRESS: [address] [place] [address] PCP Leonce Carola JINNY, PA-C Patient Care Team: Leonce Carola [name] as PCP - General (Pediatrics) Cindie Ole DASEN, MD as PCP - Electrophysiology (Cardiology) Curvin Deward MOULD, MD as Consulting Physician (General Surgery) Lanny Callander, MD as Consulting Physician (Hematology) Keenan Hastings, MD as Consulting Physician (Radiation Oncology) Tyree Nanetta SAILOR, RN as Registered Nurse Fernande Elspeth BROCKS, MD (Inactive) as Consulting Physician (Cardiology) Letha Truman ORN, NP (Inactive) as Nurse Practitioner (Nurse Practitioner) Moses Powell Hummer, NP as Nurse Practitioner (Nurse Practitioner) Shellia Oh, MD as  Consulting Physician (Pulmonary Disease) Curvin Deward MOULD, MD as Consulting Physician (General Surgery) Dolphus Reiter, MD as Consulting Physician (Rheumatology)  This Provider for this visit: Treatment Team:  Attending Provider: Geronimo Amel, MD   06/24/2024 -   Chief Complaint  Patient presents with   Medical Management of Chronic Issues  Interstitial Lung Disease    Breathing has been stable overall. No new concerns.      HPI Cleva Camero Sampey 74 y.o. 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  Since her last clinic visit she was in [place] .  She recently spent four weeks in [place], [place] , due to her nephew's illness and subsequent passing from congestive heart failure.  When she was there her defibrillator malfunction.  And apparently some nebulizer was stopped.  Since then she is followed by Dr. Ole Holts  In terms of her right heart cath: Results are above.  It is diagnosed with WHO group 3 pulmonary hypertension.  I counseled her about indication to treat this with inhaled treprostinil .  Short of the evidence.  Potential benefit and also moderating IPF.  Side effect profile and intolerance of at least 25% of patients quitting the drug because of cough congestion headache.  She is willing to try this.  She prefers the DPI.  I made a referral to the pharmacy team and also contacted Aleck Puls the pharmacist.  She remains on Symbicort  was wondering about continuing this.  For now she will continue this.   Research protocol.  The right heart cath was done research protocol.  She got billed unexpectedly for $450.  I have contacted the finance team to inquire into this and if this is due to research then revoked the charge and prevent from going to the patient or patient insurance.  She remains interested in future research studies which we can consider for group 3 PAH.   In terms of IPF: This is stable  In terms of pirfenidone   uptake: Liver function test intermittently high.  She is not having any side effects but we will check LFTs today  In terms of chronic cough: External records reviewed she is seen voice rehab and her cough is better.   OV 09/03/2024  Subjective:  Patient ID: Adriana Pais [name], female , DOB: 1951-01-24 , age 89 y.o. , MRN: [id number] , ADDRESS: [address] [place] [address] PCP Leonce Carola PARAS, PA-C Patient Care Team: Leonce Carola PARAS DEVONNA as PCP - General (Pediatrics) Holts Ole DASEN, MD (Inactive) as PCP - Electrophysiology (Cardiology) Curvin Deward MOULD, MD as Consulting Physician (General Surgery) Lanny Callander, MD as Consulting Physician (Hematology) Keenan Hastings, MD as Consulting Physician (Radiation Oncology) Tyree Nanetta SAILOR, RN as Registered Nurse Fernande Elspeth BROCKS, MD (Inactive) as Consulting Physician (Cardiology) Letha Truman ORN, NP (Inactive) as Nurse Practitioner (Nurse Practitioner) Moses Powell Hummer, NP as Nurse Practitioner (Nurse Practitioner) Shellia Oh, MD as Consulting Physician (Pulmonary Disease) Curvin Deward MOULD, MD as Consulting Physician (General Surgery) Dolphus Reiter, MD as Consulting Physician (Rheumatology)  This Provider for this visit: Treatment Team:  Attending Provider: Geronimo Amel, MD   #IPF patient unable to start pirfenidone  because of previous elevations in liver enzymes deemed as fatty liver by GI  - LAST HRCT Nov 2023  - Last CT (angion) aug 2024\   - aug 2025 with prgression   #Outpatient COVID-19 in July 2024 followed by pulmonary embolism mid August 2024.  [Previous history of VTE]  -Pradaxa  changed to Eliquis  in fall 2024 after burning in the throat n ddonig well  #Sleep apnea in the process of transfer from Dr. Shellia to Dr. MALVA  #Esbriet /Pirfenidone  requires intensive drug monitoring due to high concerns for Adverse effects of , including  Drug Induced Liver Injury, significant GI side effects  that  include but not limited to Diarrhea, Nausea, Vomiting,  and other system side effects that include Fatigue, headaches, weight loss and other side effects such as skin rash. These will be monitored with  blood work such as LFT initially once a month for 6 months and then quarterly On   - ELEVated LFT on and off  - Last Jan 2025 and sept 2025  #Nonischemic cardiomyopathy ejection fraction 45-50% even in 2021 with normal cardiac stress test most recent echo late 2024.  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 Breathing is about the same. She has some cough after Tyvaso  tx- non prod.      HPI Katleen Carraway 74 y.o. -etty Mitali Shenefield is a 74 year old female with pulmonary hypertension and fibrosis who presents with persistent cough and medication side effects.  She started inhaled treprostinil  on 07/23/2024.  She is using DPI she is at the full dose. She has a persistent cough that primarily occurs after using her inhaler, which she started on December 4th. The cough lasts about 15-20 minutes and is severe enough that people ask if she is okay. She uses water  to help settle the cough. She is currently taking Esbriet ,, and Tyvaso . She experiences cough after taking Tyvaso  and occasional headaches that are not severe enough to require medication. She also mentions wheezing at night when lying down and a runny nose for about a month.Her breathing feels  stable, but she experiences some changes in her chest, which she cannot clearly describe. She mentions that her heart feels different, possibly better, but she is unsure.  She is concerned about the progression of her pulmonary hypertension and fibrosis, expressing fear about the future and the possibility of needing oxygen. She inquires about the possibility of a transplant and mentions her age as a potential barrier.  After shared decision making decided to send her to [facility] lung transplant program.   Of note today she is enrolled in the [facility] research registry program for [facility] group 3 pH ILD patients.     SYMPTOM SCALE - ILD 08/23/2022 11/22/2022  05/23/2023  10/28/2023 Esbriet . 02/18/2024 esbiret 04/23/2024  06/24/2024 Nowwit dix of PAH. On esbreit 09/03/2024 Pirfenidone  Inhaled treprostinil  DPI since 07/23/2024  Current weight    Pirfenidone  -today goes to 3 pills 3 times daily       O2 use x ra ra ra ra ra ra ra  Shortness of Breath 0 -> 5 scale with 5 being worst (score 6 If unable to do)         At rest 0 2 1 1 1 1 1 1   Simple tasks - showers, clothes change, eating, shaving 2 3 3 3 3 2 2 2   Household (dishes, doing bed, laundry) 1 4 3 3 3 2 3 3   Shopping 1 3 3 2 2 2  2  2  Walking level at own pace 2 3 3 2 1 2 3 2   Walking up Stairs 3 5 5 5 4 4 4 4   Total (30-36) Dyspnea Score 9 20 18 16 14 13 15 14     Non-dyspnea symptoms (0-> 5 scale) 08/23/2022 01/03/2023  05/23/2023  10/28/2023  02/18/2024  04/23/2024  06/24/2024  09/03/24 d  How bad is your cough? 0 3 3 2 3 2 1 4   How bad is your fatigue 0 2 3 1 2 2 1 2   How bad is nausea 00 0 0 0 0 0 0 1  How bad is vomiting?  0 0 0 0 0 0 0 0  How bad is diarrhea? 0 0 0 1 0 0 00 0  How bad is anxiety? 2 4 3 2 2 2  0 2  How bad is depression 2 5 4 2 1 2 1 2   Any chronic pain - if so where and how bad 1 x do spirometry and x Okay BLACK RIGHT TOE Jardiance  -vaginal itch 0 2 itching      SIT STAND TEST - goal 15 times   02/18/2024   09/03/2024   O2 used ra 6-minute walk test in research  PRobe - finter or forehead finger   Number sit and stand completed - goal 15 15   Time taken to complete 15 sec   Resting Pulse Ox/HR/Dyspnea  96% and 83/min and dyspnea of 2/10    Peak measures 95 % and 103/min and dyspnea of 4/10   Final Pulse Ox/HR 91% and 94/min and dyspnea of 2/10   Desaturated </= 88% no   Desaturated <= 3% points yes   Got Tachycardic >/= 90/min yes   Miscellaneous comments Level 4 dyspnea Lowest oxygen saturation was 92%. The highest pulse rate was 104. Total distance was 220 meters.   =   PFT     Latest Ref Rng & Units 09/03/2024   10:54 AM 02/12/2024    1:54 PM 10/22/2023   11:01 AM 07/15/2023    1:56 PM 02/19/2023   11:26 AM 08/24/2022    1:55 PM 06/08/2022    3:49 PM  PFT Results  FVC-Pre L 2.22  P 2.27  2.26  2.30  2.25  2.30  2.33   FVC-Predicted Pre % 69  P 71  70  72  71  72  73   FVC-Post L       2.36   FVC-Predicted Post %       74   Pre FEV1/FVC % % 91  P 85  94  91  88  92  93   Post FEV1/FCV % %       93   FEV1-Pre L 2.02  P 1.93  2.13  2.08  1.97  2.12  2.17   FEV1-Predicted Pre % 84  P 80  88  87  82  88  90   FEV1-Post L       2.20   DLCO uncorrected ml/min/mmHg 9.14  P 8.97  15.53  10.02  11.85  11.05  12.95   DLCO UNC% % 43  P 42  73  48  56  52  62   DLCO corrected ml/min/mmHg   15.05  10.14  11.85  11.05  12.95   DLCO COR %Predicted %   71  48  56  52  62   DLVA Predicted % 72  P 70  103  81  94  87  93   TLC L       3.76   TLC % Predicted %       69   RV % Predicted %       55     P Preliminary result       LAB RESULTS last 96 hours No results found.       has a past medical history of AICD (automatic cardioverter/defibrillator) present, Allergy (09/20/1994), Anxiety, Arthritis, Back pain, Breast cancer (HCC) (2016), Bronchiectasis (HCC), Cerebral aneurysm, nonruptured, CHF (congestive heart failure) (HCC), Clostridium difficile infection, Depressive disorder, not  elsewhere classified, Diverticulosis of colon (without mention of hemorrhage), Esophageal reflux, Fatty liver, Fibromyalgia, Gastritis, GERD (gastroesophageal reflux disease), GI bleed (2004), Glaucoma, Hiatal hernia, History of COVID-19 (05/04/2021), Hyperlipidemia, Hypertension, Hypothyroidism, Internal hemorrhoids, Joint pain, Multifocal pneumonia (06/03/2021), Obstructive sleep apnea (adult) (pediatric), Osteoarthritis, Ostium secundum type atrial septal defect, Other chronic nonalcoholic liver disease, Other pulmonary embolism and infarction, Palpitations, Paroxysmal ventricular tachycardia (HCC), Personal history of radiation therapy, PONV (postoperative nausea and vomiting), Presence of permanent cardiac pacemaker, PUD (peptic ulcer disease), Radiation (02/03/15-03/10/15), Sarcoid, Schatzki's ring, Shortness of breath, Sleep apnea, Stroke (HCC) (2013), Takotsubo syndrome, Tubular adenoma of colon, Unspecified transient cerebral ischemia, and Unspecified vitamin D  deficiency.   reports that she quit smoking about 35 years ago. Her smoking use included cigarettes. She started smoking about 55 years ago. She has a 20 pack-year smoking history. She has been exposed to tobacco smoke. She has never used smokeless tobacco.  Past Surgical History:  Procedure Laterality Date   ABDOMINAL HYSTERECTOMY     ABI  2006   normal   BONE EXOSTOSIS EXCISION Right 04/02/2024   Procedure: EXCISION, EXOSTOSIS;  Surgeon: Kit Rush, MD;  Location: [facility] OR;  Service: Orthopedics;  Laterality: Right;   BREAST EXCISIONAL BIOPSY     BREAST LUMPECTOMY Right 2016   BREAST LUMPECTOMY WITH RADIOACTIVE SEED LOCALIZATION Right 01/03/2015   Procedure: BREAST LUMPECTOMY WITH RADIOACTIVE SEED LOCALIZATION;  Surgeon: Deward Null III, MD;  Location: [facility] OR;  Service: General;  Laterality: Right;   CARDIAC DEFIBRILLATOR PLACEMENT  2006; 2012   BSX single chamber ICD   CARDIAC DEFIBRILLATOR PLACEMENT  2025   carotid dopplers  2006    neg   CEREBRAL ANEURYSM REPAIR  02/1999   COLONOSCOPY     EYE SURGERY Right 02/2023   surgery center - for cataracts   HAMMER TOE SURGERY  11/15/2020   3rd digit bilateral feet    hospitalization  2004   GI bleed, PUD, diverticulosis (EGD,colonscopy)   hospitalization     PE, NSVT, s/p defib   JOINT REPLACEMENT  05/2021   KNEE ARTHROSCOPY     bilateral   LEFT HEART CATHETERIZATION WITH CORONARY ANGIOGRAM N/A 02/22/2012   Procedure: LEFT HEART CATHETERIZATION WITH CORONARY ANGIOGRAM;  Surgeon: Lonni [name] Cash, MD;  Location: [facility] C[facility] LAB;  Service: Cardiovascular;  Laterality: N/A;   PARTIAL HYSTERECTOMY     Fibroids   RIGHT HEART CATH N/A 06/18/2024   Procedure: RIGHT HEART CATH;  Surgeon: Rolan Ezra RAMAN, MD;  Location: [facility] INVASIVE CV LAB;  Service: Cardiovascular;  Laterality: N/A;   TONGUE BIOPSY  12/12/2017   due to sore tongue and white patches/abnormal cells   TOTAL KNEE ARTHROPLASTY Left 02/13/2016   Procedure: TOTAL KNEE ARTHROPLASTY;  Surgeon: Marcey Raman, MD;  Location: [facility] OR;  Service: Orthopedics;  Laterality: Left;   TOTAL KNEE ARTHROPLASTY Right 05/22/2021   Procedure: TOTAL KNEE ARTHROPLASTY;  Surgeon: Rubie Kemps, MD;  Location: [facility] ORS;  Service: Orthopedics;  Laterality: Right;  with block   TUBAL LIGATION     UPPER GASTROINTESTINAL ENDOSCOPY      Allergies[1]  Immunization History  Administered Date(s) Administered   Fluad Quad(high Dose 65+) 04/17/2019, 04/30/2020   Fluad Trivalent(High Dose 65+) 05/23/2023   H1N1 07/24/2008   INFLUENZA, HIGH DOSE SEASONAL PF 05/30/2017, 05/21/2018, 04/20/2024   Influenza Split 07/17/2011, 09/27/2011, 04/29/2012   Influenza Whole 07/20/2004, 07/04/2007, 05/07/2008, 05/19/2009, 05/17/2010   Influenza,inj,Quad PF,6+ Mos 05/20/2013, 05/06/2014, 05/17/2015, 05/15/2016, 04/27/2021   Influenza-Unspecified 05/07/2018   PFIZER(Purple Top)SARS-COV-2 Vaccination 09/25/2019, 10/16/2019, 06/04/2020, 01/04/2021, 08/06/2021    PNEUMOCOCCAL CONJUGATE-20 08/06/2021   Pfizer Covid-19 Vaccine Bivalent Booster 74yrs & up 05/07/2022   Pneumococcal Conjugate-13 11/22/2015, 08/06/2021   Pneumococcal Polysaccharide-23 05/20/2002, 05/07/2008, 06/25/2014   Respiratory Syncytial Virus Vaccine ,Recomb Aduvanted(Arexvy ) 06/26/2022   Td 09/07/2009   Zoster, Live 09/21/2011    Family History  Problem Relation Age of Onset   Diabetes Mother    Alzheimer's disease Mother    Hypertension Mother    Obesity Mother    Arthritis Mother    Heart disease Father    Seizures Sister    Allergies Sister    Colon polyps Sister    Heart disease Sister    Early death Sister    Anxiety disorder Sister    Arthritis Sister    Depression Sister    Hypertension Sister    Other Brother        Thyroid  problem 10/2016   Hearing loss Brother    Vision loss Brother    Heart disease Brother    Prostate cancer Brother 68       same brother as throat cancer   Throat cancer Brother        dx in his 42s; also a smoker   Arthritis Brother    Hypertension Brother    Breast cancer Maternal Aunt 55   Colon cancer Maternal Aunt 18       same sister as breast at 36   Breast cancer Maternal Aunt        dx in her 74s   Lung cancer Maternal Uncle    Cancer Maternal Grandmother 13       colon cancer or abdominal cancer   Lung cancer Maternal Grandfather 78   Heart disease Son        Cardiac Arrest 07/2016   Pancreatic cancer Cousin 4       maternal first cousin   Breast cancer Cousin        paternal first cousin twice removed died in her 30s   Congestive Heart Failure Nephew    Stroke Neg Hx    Esophageal cancer Neg Hx    Stomach cancer Neg Hx     Current Medications[2]      Objective:   Vitals:   09/03/24 1147  BP: 124/76  Pulse: 84  SpO2: 97%  Weight: 204 lb (92.5 kg)  Height: 5' 7 (1.702 m)    Estimated body mass index is 31.95 kg/m as calculated from the following:   Height as of this encounter: 5' 7 (1.702 m).    Weight as of this encounter: 204 lb (92.5 kg).  @WEIGHTCHANGE @  Filed Weights   09/03/24 1147  Weight: 204 lb (92.5 kg)     Physical Exam   General: No distress. Looks stabl O2 at rest: no Cane present: no Sitting in wheel chair: no Frail: no Obese: yes  Neuro: Alert and Oriented x 3. GCS 15. Speech normal Psych: Pleasant Resp:  Barrel Chest - no.  Wheeze - no, Crackles - yes, No overt respiratory distress CVS: Normal heart sounds. Murmurs - no Ext: Stigmata of Connective Tissue Disease - no HEENT: Normal upper airway. PEERL +. No post nasal drip        Assessment/     Assessment & Plan IPF (idiopathic pulmonary fibrosis) (HCC)  WHO group 3 pulmonary arterial hypertension (HCC)  Encounter for therapeutic drug monitoring  History of pulmonary embolism  History of asthma  Medication monitoring encounter    PLAN Patient Instructions  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  - course complicated by cough with DPI =enrolled jan 2026 in DECIPHER study registry   Plan  Cotineu tyvaso  standard of care DPI - Thanks for participation in registry study - Then next step consider Phocus Pulmovant study    IPF (idiopathic pulmonary fibrosis) (HCC) Medication monitoring encounter History of abnormal liver function  -Currently tolerating Esbriet  well without any side effects although liver function test was elevated in January 2025 and sept 2025 chronically    Plan -Check liver function test today 09/03/2024 - continue esbriet  - refer [facility] at your request  History of 2019 novel coronavirus disease (COVID-19) History of pulmonary embolism April 02, 2023 Throat burning with Pradaxa   - Doing well on Eliquis    Plan -Check D-dimer today 09/03/2024 -Continue Eliquis  full doe; summer 2026 will look at reducing the dose possibly   Chronic cough 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 History of asthma   - Probably due to combination of cough  neuropathy and fibrosis - Noted you stopped gabapentin  bcuase it made you tired - glad cough better after seeing Leita Aures  Plan ---Continue allergy/asthma treatment of Symbicort  and Singulair , Flonase  and Allegra as before - Continue house dust mite control as you have been doing - continue teassoln perles    OSA (obstructive sleep apnea)   Plan - Under the care of  sleep doctor Dr. MALVA   Followup  -- 2 months to  to see Dr. Geronimo in a 30-minute visit after spiro/dlco  -Symptom score and exercise hypoxemia test at follow-up     FOLLOWUP    Return for  -- 2 months to  to see Dr. Geronimo in a 30-minute visit after spiro/dlco.    SIGNATURE    Dr. Dorethia Geronimo, M.D., F.C.C.P,  Pulmonary and Critical Care Medicine Staff Physician, [facility] Director - Interstitial Lung Disease  Program  Pulmonary Fibrosis [facility] at [facility] [place], [place], [address]  Pager: [phone], If no answer or between  15:00h - 7:00h: call [phone] Telephone: [phone]  5:33 PM 09/03/2024     [1]  Allergies Allergen Reactions   Ace Inhibitors Swelling    Angioedema; makes tongue break out    Amitriptyline Hcl Other (See Comments)     makes her too sleepy!   Atorvastatin Other (See Comments)    SEVERE MYALGIA   Dilantin [Phenytoin Sodium Extended] Rash    Severe rash   Duloxetine  Hcl Nausea Only and Other (See Comments)    Sleepiness/ sick  Other Reaction(s): GI Intolerance   Paroxetine Nausea Only    Rapid heartbeat   Ramipril  Other (See Comments)    TONGUE ULCERS    Rosuvastatin Other (See Comments)    SEVERE MYALGIA   Pradaxa  [Dabigatran  Etexilate Mesylate]  Throat irritation   Carbamazepine Rash   Codeine  Itching   Phenytoin Sodium Extended Rash   Simvastatin  Other (See Comments)    Increase in CK, myalgias   Wellbutrin  [Bupropion ] Palpitations  [2]  Current Outpatient Medications:     azelastine  (ASTELIN ) 0.1 % nasal spray, Place 1 spray into both nostrils 2 (two) times daily. Use in each nostril as directed, Disp: 90 mL, Rfl: 3   carvedilol  (COREG ) 3.125 MG tablet, Take 1 tablet (3.125 mg total) by mouth 2 (two) times daily., Disp: 180 tablet, Rfl: 3   Cholecalciferol  (DIALYVITE VITAMIN D  5000) 125 MCG (5000 UT) capsule, Take 5,000 Units by mouth daily., Disp: , Rfl:    cloBAZam  (ONFI ) 10 MG tablet, Take 1 tablet by mouth twice daily, Disp: 60 tablet, Rfl: 5   ELIQUIS  5 MG TABS tablet, TAKE 1 TABLET TWICE DAILY, Disp: 180 tablet, Rfl: 3   empagliflozin  (JARDIANCE ) 10 MG TABS tablet, TAKE 1 TABLET EVERY DAY, Disp: 90 tablet, Rfl: 3   famotidine  (PEPCID ) 40 MG tablet, TAKE 1 TABLET AT BEDTIME (NEED MD APPOINTMENT), Disp: 90 tablet, Rfl: 3   hydroxypropyl methylcellulose / hypromellose (ISOPTO TEARS / GONIOVISC) 2.5 % ophthalmic solution, Place 1 drop into both eyes at bedtime., Disp: , Rfl:    latanoprost  (XALATAN ) 0.005 % ophthalmic solution, Place 1 drop into both eyes at bedtime., Disp: , Rfl:    levocetirizine (XYZAL ) 5 MG tablet, Take 5 mg by mouth at bedtime., Disp: , Rfl:    levothyroxine  (SYNTHROID ) 50 MCG tablet, TAKE 1 TABLET EVERY DAY, Disp: 90 tablet, Rfl: 3   magnesium  oxide (MAG-OX) 400 (240 Mg) MG tablet, TAKE 1 TABLET TWICE DAILY, Disp: 180 tablet, Rfl: 3   montelukast  (SINGULAIR ) 10 MG tablet, TAKE 1 TABLET AT BEDTIME, Disp: 90 tablet, Rfl: 3   Multiple Vitamins-Minerals (MULTIVITAMIN PO), Take 1 tablet by mouth daily., Disp: , Rfl:    oxyCODONE  (OXY IR/ROXICODONE ) 5 MG immediate release tablet, Take 0.5-1 tablets (2.5-5 mg total) by mouth 2 (two) times daily as needed for severe pain (pain score 7-10). ICD10- G89.4- chronic pain in hips/shoulders due to inflammatory arthritis, Disp: 50 tablet, Rfl: 0   pantoprazole  (PROTONIX ) 40 MG tablet, TAKE 1 TABLET EVERY DAY, Disp: 90 tablet, Rfl: 3   Pirfenidone  267 MG TABS, Take 3 tablets (801 mg total) by mouth 3 (three)  times daily with meals. **CHECK LIVER FUNCTION EVERY 3 MONTHS**, Disp: 270 tablet, Rfl: 2   polyethylene glycol powder (GLYCOLAX /MIRALAX ) powder, Take 17 grams by mouth daily, Disp: 1080 g, Rfl: 0   potassium chloride  (KLOR-CON  M) 10 MEQ tablet, TAKE 1/2 TABLET EVERY DAY, Disp: 45 tablet, Rfl: 3   pregabalin  (LYRICA ) 25 MG capsule, Take 1 capsule (25 mg total) by mouth 3 (three) times daily. Start with 1 pill/day and work up s discussed- for neurogenic cough- stopping gabapentin , Disp: 90 capsule, Rfl: 5   Respiratory Therapy Supplies (FLUTTER) DEVI, 1 Device by Does not apply route as needed., Disp: 1 each, Rfl: 0   traZODone  (DESYREL ) 100 MG tablet, TAKE 1 TO 1 AND 1/2 TABLETS AT BEDTIME, Disp: 135 tablet, Rfl: 3   Treprostinil  (TYVASO  DPI INSTITUTIONAL KIT) 64 MCG POWD, Inhale 64 mcg into the lungs in the morning, at noon, in the evening, and at bedtime., Disp: 112 each, Rfl: 3   budesonide -formoterol  (SYMBICORT ) 80-4.5 MCG/ACT inhaler, Inhale 1 puff into the lungs 2 (two) times daily., Disp: 3 each, Rfl: 4  Current Facility-Administered Medications:    lidocaine  (XYLOCAINE ) 1 % (  with pres) injection 1 mL, 1 mL, Other, Once, Lovorn, Megan, MD   lidocaine  (XYLOCAINE ) 1 % (with pres) injection 3 mL, 3 mL, Other, Once, Lovorn, Megan, MD  "

## 2024-09-04 ENCOUNTER — Ambulatory Visit: Admitting: Student in an Organized Health Care Education/Training Program

## 2024-09-04 ENCOUNTER — Ambulatory Visit: Attending: Cardiology | Admitting: Pharmacist

## 2024-09-04 DIAGNOSIS — E7849 Other hyperlipidemia: Secondary | ICD-10-CM | POA: Diagnosis present

## 2024-09-04 LAB — HEPATIC FUNCTION PANEL
ALT: 32 IU/L (ref 0–32)
AST: 34 IU/L (ref 0–40)
Albumin: 4.1 g/dL (ref 3.8–4.8)
Alkaline Phosphatase: 147 IU/L — ABNORMAL HIGH (ref 49–135)
Bilirubin Total: 0.5 mg/dL (ref 0.0–1.2)
Bilirubin, Direct: 0.14 mg/dL (ref 0.00–0.40)
Total Protein: 7.1 g/dL (ref 6.0–8.5)

## 2024-09-04 LAB — CK: Total CK: 132 U/L (ref 32–182)

## 2024-09-04 LAB — D-DIMER, QUANTITATIVE: D-Dimer, Quant: 0.48 ug{FEU}/mL

## 2024-09-04 NOTE — Progress Notes (Addendum)
 Patient ID: [name]                 DOB: August 24, 1950                    MRN: [id number]      HPI: [name] is a 74 y.o. female patient referred to lipid clinic by Dr.Segal . PMH is significant for CHF, hx of NICM, elevated CK on Repatha , OSA, statin myopathy, coronary artery calcification on CT, HLD, hx of PE, cerebral aneurysm, hx of mitral valve prolapse   I saw this this patient for lipid management on 09/07/2022 she is intolerant to statins sue to elevated CK level so she was only on Zetia . Nexlizet  was cost prohibitive at that time so patient was put on Repatha . Recently her lab for CK was elevated (03/13/24) 308 Repatha  was stopped and lab was rechecked on 07/28/2024 CK level went back to normal range 171.  The patient presented today for a lipid follow-up visit. We discussed in detail how Repatha  works, including its mechanism of action, and reviewed that it is unlikely to be the cause of her elevated CK level. She reports that she tolerated Repatha  significantly better than statins. However some leg cramps noted while she was on Repatha  The patient expressed curiosity about possible causes of her CK elevation. I reviewed common etiologies, including renal dysfunction, rheumatoid or inflammatory disorders, hypothyroidism, intensive exercise, proteinuria, and certain genetic muscle diseases. She was advised to discuss this further with her PCP for a more comprehensive evaluation to rule out the underlying cause. Her chart indicates a history of chronic non-alcoholic liver disease, and her recent labs show an elevated alkaline phosphatase also on other meds that could affect liver enzymes. The patient would like to resume Repatha , as she has remaining doses at home and previously tolerated it well. A recommendation regarding restarting Repatha  will be made after further review of her recent labs and coordination with her PCP to ensure clinical appropriateness.   Current  Medications: none  Intolerances: Statins - myalgia CK elevation.  Risk Factors: hx TIA, cerebral aneurysm, PFO  LDL goal: <70 or <55 mg/dl   Diet: chicken, sea food vegetables, bread   Eat out - 2 times per week   Exercise: none doesn't have time   Family History:  Mother - [name] (Deceased at age 98) Alzheimer's disease   Arthritis   Diabetes   Hypertension   Obesity     Father - [name] (Deceased at age 37) Heart disease     Sister (Alive) Allergies   Seizures     Sister - [name] (Deceased at age 40) Colon polyps   Early death   Heart disease     Sister - [name] Anxiety disorder   Arthritis   Depression   Hypertension     Brother - [name] (Deceased) Hearing loss   Other Thyroid  problem 10/2016  Vision loss     Brother - [name] (Deceased) Arthritis   Heart disease   Hypertension   Prostate cancer (Age: 27) same brother as throat cancer  Throat cancer dx in his 68s; also a smoker    Maternal Aunt (Alive) Breast cancer (Age: 33)   Colon cancer (Age: 22) same sister as breast at 62    Maternal Aunt (Deceased) Breast cancer dx in her 65s    Maternal Aunt (Alive)   Maternal Uncle (Deceased) Lung cancer     Maternal Grandmother -  [name] (Deceased) Cancer (Age: 29) colon cancer or abdominal cancer    Maternal Grandfather (Deceased) Lung cancer (Age: 36)     Paternal Grandmother (Deceased)   Paternal Grandfather (Deceased)   Cousin (Deceased) Pancreatic cancer (Age: 4) maternal first cousin    Cousin (Deceased) Breast cancer paternal first cousin twice removed died in her 60s    Nephew (Deceased) Congestive Heart Failure     Son - [name] Heart disease Cardiac Arrest 07/2016    Neg Hx Esophageal cancer   Stomach cancer   Stroke      Social History:  Alcohol : none  Smoking: quit in 1991   Labs:  Lipid Panel     Component Value Date/Time   CHOL 253 (H) 07/28/2024 1059   TRIG 87  07/28/2024 1059   HDL 59 07/28/2024 1059   CHOLHDL 4.3 07/28/2024 1059   CHOLHDL 3 12/20/2020 1045   VLDL 18.4 12/20/2020 1045   LDLCALC 179 (H) 07/28/2024 1059   LDLDIRECT 141.8 07/04/2007 0936   LABVLDL 15 07/28/2024 1059    Past Medical History:  Diagnosis Date   AICD (automatic cardioverter/defibrillator) present    [employer]   Allergy 09/20/1994   Anxiety    Arthritis    Back pain    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   Bronchiectasis (HCC)    Cerebral aneurysm, nonruptured    had a clip put in   CHF (congestive heart failure) (HCC)    Clostridium difficile infection    Depressive disorder, not elsewhere classified    Diverticulosis of colon (without mention of hemorrhage)    Esophageal reflux    Fatty liver    Fibromyalgia    Gastritis    GERD (gastroesophageal reflux disease)    GI bleed 2004   Glaucoma    Hiatal hernia    History of COVID-19 05/04/2021   Hyperlipidemia    Hypertension    Hypothyroidism    Internal hemorrhoids    Joint pain    Multifocal pneumonia 06/03/2021   Obstructive sleep apnea (adult) (pediatric)    Osteoarthritis    Ostium secundum type atrial septal defect    Other chronic nonalcoholic liver disease    Other pulmonary embolism and infarction    Palpitations    Paroxysmal ventricular tachycardia (HCC)    Personal history of radiation therapy    PONV (postoperative nausea and vomiting)    Presence of permanent cardiac pacemaker    PUD (peptic ulcer disease)    Radiation 02/03/15-03/10/15   Right Breast   Sarcoid    per pt , not sure   Schatzki's ring    Shortness of breath    with exertion - has oxygen prn via [place] at 2 L   Sleep apnea    uses CPAP nightly   Stroke [facility]) 2013   tia/ pt feels it was around 2008 0r 2009   Takotsubo syndrome    Tubular adenoma of colon    Unspecified transient cerebral ischemia    Unspecified vitamin D  deficiency     Medications Ordered Prior to  Encounter[1]  Allergies[2]  Assessment/Plan:  1. Hyperlipidemia -  Assessment and plan:  The patient has a history of statin intolerance due to severe myalgias and CK elevation. Her LDL-C remains approximately 180 mg/dL while off therapy. She was previously on Repatha  and reported experiencing leg cramps at night. At that time, her CK level was found to be elevated; however, the CK  returned to normal after holding the PCSK9 inhibitor. We discussed alternative lipid-lowering options, including ezetimibe  and bempedoic acid. Given the patients persistently elevated LDL-C, her documented intolerance to statins, and the fact that PCSK9 inhibitors typically reduce LDL-C by approximately 50-60%, and unlikely to affect CK level  it is reasonable to rechallenge with a PCSK9 inhibitor. The patient is in agreement with this plan. We will recheck CK levels after two injections, and the patient has been advised to monitor for any recurrence of muscle cramps and notify us  by phone if symptoms occur. If the PCSK9 inhibitor is well tolerated, therapy will be continued. If intolerance recurs, we will consider combination therapy with ezetimibe  and bempedoic acid as the next step.    Thank you,  Robbi Blanch, Pharm.D Micco Elspeth BIRCH. [facility] [address], [place], [place] [address] Phone: [phone]; Fax: [phone]   Volis I, Miles BRAVO, Saliba W, Zafrir B. A safety and clinical efficacy analysis of PCSK9 monoclonal antibodies in patients with markedly elevated creatine phosphokinase levels. Am J Blood Res. 2021 Aug 15;11(4):399-404. PMID: 65459651; PMCID: [id number].    [1]  Current Outpatient Medications on File Prior to Visit  Medication Sig Dispense Refill   azelastine  (ASTELIN ) 0.1 % nasal spray Place 1 spray into both nostrils 2 (two) times daily. Use in each nostril as directed 90 mL 3   budesonide -formoterol  (SYMBICORT ) 80-4.5 MCG/ACT inhaler  Inhale 1 puff into the lungs 2 (two) times daily. 3 each 4   carvedilol  (COREG ) 3.125 MG tablet Take 1 tablet (3.125 mg total) by mouth 2 (two) times daily. 180 tablet 3   Cholecalciferol  (DIALYVITE VITAMIN D  5000) 125 MCG (5000 UT) capsule Take 5,000 Units by mouth daily.     cloBAZam  (ONFI ) 10 MG tablet Take 1 tablet by mouth twice daily 60 tablet 5   ELIQUIS  5 MG TABS tablet TAKE 1 TABLET TWICE DAILY 180 tablet 3   empagliflozin  (JARDIANCE ) 10 MG TABS tablet TAKE 1 TABLET EVERY DAY 90 tablet 3   famotidine  (PEPCID ) 40 MG tablet TAKE 1 TABLET AT BEDTIME (NEED MD APPOINTMENT) 90 tablet 3   hydroxypropyl methylcellulose / hypromellose (ISOPTO TEARS / GONIOVISC) 2.5 % ophthalmic solution Place 1 drop into both eyes at bedtime.     latanoprost  (XALATAN ) 0.005 % ophthalmic solution Place 1 drop into both eyes at bedtime.     levocetirizine (XYZAL ) 5 MG tablet Take 5 mg by mouth at bedtime.     levothyroxine  (SYNTHROID ) 50 MCG tablet TAKE 1 TABLET EVERY DAY 90 tablet 3   magnesium  oxide (MAG-OX) 400 (240 Mg) MG tablet TAKE 1 TABLET TWICE DAILY 180 tablet 3   montelukast  (SINGULAIR ) 10 MG tablet TAKE 1 TABLET AT BEDTIME 90 tablet 3   Multiple Vitamins-Minerals (MULTIVITAMIN PO) Take 1 tablet by mouth daily.     oxyCODONE  (OXY IR/ROXICODONE ) 5 MG immediate release tablet Take 0.5-1 tablets (2.5-5 mg total) by mouth 2 (two) times daily as needed for severe pain (pain score 7-10). ICD10- G89.4- chronic pain in hips/shoulders due to inflammatory arthritis 50 tablet 0   pantoprazole  (PROTONIX ) 40 MG tablet TAKE 1 TABLET EVERY DAY 90 tablet 3   Pirfenidone  267 MG TABS Take 3 tablets (801 mg total) by mouth 3 (three) times daily with meals. **CHECK LIVER FUNCTION EVERY 3 MONTHS** 270 tablet 2   polyethylene glycol powder (GLYCOLAX /MIRALAX ) powder Take 17 grams by mouth daily 1080 g 0   potassium chloride  (KLOR-CON  M) 10 MEQ  tablet TAKE 1/2 TABLET EVERY DAY 45 tablet 3   pregabalin  (LYRICA ) 25 MG capsule Take  1 capsule (25 mg total) by mouth 3 (three) times daily. Start with 1 pill/day and work up s discussed- for neurogenic cough- stopping gabapentin  90 capsule 5   Respiratory Therapy Supplies (FLUTTER) DEVI 1 Device by Does not apply route as needed. 1 each 0   traZODone  (DESYREL ) 100 MG tablet TAKE 1 TO 1 AND 1/2 TABLETS AT BEDTIME 135 tablet 3   Treprostinil  (TYVASO  DPI INSTITUTIONAL KIT) 64 MCG POWD Inhale 64 mcg into the lungs in the morning, at noon, in the evening, and at bedtime. 112 each 3   Current Facility-Administered Medications on File Prior to Visit  Medication Dose Route Frequency Provider Last Rate Last Admin   lidocaine  (XYLOCAINE ) 1 % (with pres) injection 1 mL  1 mL Other Once Lovorn, Megan, MD       lidocaine  (XYLOCAINE ) 1 % (with pres) injection 3 mL  3 mL Other Once Lovorn, Megan, MD      [2]  Allergies Allergen Reactions   Ace Inhibitors Swelling    Angioedema; makes tongue break out    Amitriptyline Hcl Other (See Comments)     makes her too sleepy!   Atorvastatin Other (See Comments)    SEVERE MYALGIA   Dilantin [Phenytoin Sodium Extended] Rash    Severe rash   Duloxetine  Hcl Nausea Only and Other (See Comments)    Sleepiness/ sick  Other Reaction(s): GI Intolerance   Paroxetine Nausea Only    Rapid heartbeat   Ramipril  Other (See Comments)    TONGUE ULCERS    Rosuvastatin Other (See Comments)    SEVERE MYALGIA   Pradaxa  [Dabigatran  Etexilate Mesylate]     Throat irritation   Carbamazepine Rash   Codeine  Itching   Phenytoin Sodium Extended Rash   Simvastatin  Other (See Comments)    Increase in CK, myalgias   Wellbutrin  [Bupropion ] Palpitations

## 2024-09-06 ENCOUNTER — Encounter: Payer: Self-pay | Admitting: Internal Medicine

## 2024-09-07 ENCOUNTER — Ambulatory Visit: Admitting: Internal Medicine

## 2024-09-07 ENCOUNTER — Encounter: Payer: Self-pay | Admitting: Pharmacist

## 2024-09-09 ENCOUNTER — Ambulatory Visit

## 2024-09-09 ENCOUNTER — Encounter: Admitting: Physical Medicine and Rehabilitation

## 2024-09-09 DIAGNOSIS — I428 Other cardiomyopathies: Secondary | ICD-10-CM

## 2024-09-10 ENCOUNTER — Ambulatory Visit: Payer: Medicare Other | Admitting: Neurology

## 2024-09-10 ENCOUNTER — Ambulatory Visit: Payer: Self-pay

## 2024-09-10 ENCOUNTER — Encounter

## 2024-09-10 LAB — CUP PACEART REMOTE DEVICE CHECK
Battery Remaining Longevity: 180 mo
Battery Remaining Percentage: 100 %
Brady Statistic RV Percent Paced: 0 %
Date Time Interrogation Session: 20260121012000
HighPow Impedance: 64 Ohm
Implantable Lead Connection Status: 753985
Implantable Lead Implant Date: 20050603
Implantable Lead Location: 753860
Implantable Lead Model: 185
Implantable Lead Serial Number: 116340
Implantable Pulse Generator Implant Date: 20251008
Lead Channel Impedance Value: 633 Ohm
Lead Channel Pacing Threshold Amplitude: 0.8 V
Lead Channel Pacing Threshold Pulse Width: 0.4 ms
Lead Channel Setting Pacing Amplitude: 2.4 V
Lead Channel Setting Pacing Pulse Width: 0.4 ms
Lead Channel Setting Sensing Sensitivity: 0.4 mV
Pulse Gen Serial Number: 224900
Zone Setting Status: 755011

## 2024-09-11 ENCOUNTER — Encounter: Payer: Self-pay | Admitting: Podiatry

## 2024-09-11 ENCOUNTER — Ambulatory Visit: Admitting: Internal Medicine

## 2024-09-11 ENCOUNTER — Ambulatory Visit: Admitting: Podiatry

## 2024-09-11 DIAGNOSIS — D689 Coagulation defect, unspecified: Secondary | ICD-10-CM

## 2024-09-11 DIAGNOSIS — L84 Corns and callosities: Secondary | ICD-10-CM

## 2024-09-12 NOTE — Progress Notes (Signed)
 Remote ICD Transmission

## 2024-09-14 ENCOUNTER — Encounter: Admitting: Physical Medicine and Rehabilitation

## 2024-09-16 NOTE — Progress Notes (Signed)
 "  Subjective:  Patient ID: [name], female    DOB: 1951/02/12,  MRN: [id number]  [name] presents to clinic today for at risk foot care with h/o clotting disorder and corn(s) R 5th toe and callus(es) of both feet. Pain interferes with ambulation. Painful corns and calluses are aggravated when weightbearing with and without shoegear. Pain is relieved with periodic professional debridement. She states right 5th toe is painful. Chief Complaint  Patient presents with   Callouses     Remove callouses on both feet. Denies being diabetic    New problem(s): None.   PCP is Leonce Carola PARAS, PA-C.  Allergies[1]  Review of Systems: Negative except as noted in the HPI.  Objective:  There were no vitals filed for this visit. [name] is a pleasant 74 y.o. female in NAD. AAO x 3.  Vascular Examination: Capillary refill time immediate b/l. Vascular status intact b/l with palpable pedal pulses. Pedal hair present b/l. No pain with calf compression b/l. Skin temperature gradient WNL b/l. No cyanosis or clubbing b/l. No ischemia or gangrene noted b/l. No edema noted b/l LE.  Neurological Examination: Toenails adequate length. Sensation grossly intact b/l with 10 gram monofilament. Vibratory sensation intact b/l.   Dermatological Examination: Pedal skin with normal turgor, texture and tone b/l.  No open wounds. No interdigital macerations.   Hyperkeratotic lesion(s) dorsal PIPJ right 5th toe, bilateral great toes, submet head 2 b/l, and 5th met base of both feet.  No erythema, no edema, no drainage, no fluctuance.  Musculoskeletal Examination: Muscle strength 5/5 to all lower extremity muscle groups bilaterally. No pain, crepitus or joint limitation noted with ROM bilateral LE. Hammertoes b/l 5th digits. Pes planus deformity noted bilateral LE.  Radiographs: None Assessment/Plan: 1. Corns and callosities   2. Clotting disorder     Consent given for treatment. Patient  examined. All patient's and/or POA's questions/concerns addressed on today's visit. Toenails 1-5 b/l debrided in length and girth without incident. Corn(s) and callus(es) medial IPJ of left great toe, medial IPJ of right great toe, dorsal PIPJ right 5th toe, submet head 2 b/l, and 5th met base of both feet pared with sharp debridement. Pinpoint bleeding of right 5th digit addressed with Lumicain Hemostatic Solution. Tripe antibiotic applied. She is to apply Neosporin to digit once daily for one week. Refrain from pedicures for one week. Continue soft, supportive shoe gear daily. Report any pedal injuries to medical professional. Call office if there are any questions/concerns.  Return in about 12 weeks (around 12/04/2024).  Delon LITTIE Merlin, DPM      [place] LOCATION: [address], [place] [address]                   Office [phone]   [place] LOCATION: [address] [place], [place] [address] Office [phone]     [1]  Allergies Allergen Reactions   Ace Inhibitors Swelling    Angioedema; makes tongue break out    Amitriptyline Hcl Other (See Comments)     makes her too sleepy!   Atorvastatin Other (See Comments)    SEVERE MYALGIA   Dilantin [Phenytoin Sodium Extended] Rash    Severe rash  Duloxetine  Hcl Nausea Only and Other (See Comments)    Sleepiness/ sick  Other Reaction(s): GI Intolerance   Paroxetine Nausea Only    Rapid heartbeat   Ramipril  Other (See Comments)    TONGUE ULCERS    Rosuvastatin Other (See Comments)    SEVERE MYALGIA   Pradaxa  [Dabigatran  Etexilate Mesylate]     Throat irritation   Carbamazepine Rash   Codeine  Itching   Phenytoin Sodium Extended Rash   Simvastatin  Other (See Comments)    Increase in CK, myalgias   Wellbutrin  [Bupropion ] Palpitations   "

## 2024-09-17 ENCOUNTER — Ambulatory Visit: Admitting: Internal Medicine

## 2024-09-17 ENCOUNTER — Ambulatory Visit (HOSPITAL_COMMUNITY)
Admission: RE | Admit: 2024-09-17 | Discharge: 2024-09-17 | Disposition: A | Discharge status: Home / Self Care | Payer: Self-pay | Source: Ambulatory Visit | Attending: Physician Assistant | Admitting: Physician Assistant

## 2024-09-17 ENCOUNTER — Encounter: Payer: Self-pay | Admitting: Internal Medicine

## 2024-09-17 VITALS — BP 124/72 | HR 99 | Ht 67.0 in | Wt 205.0 lb

## 2024-09-17 DIAGNOSIS — Z888 Allergy status to other drugs, medicaments and biological substances status: Secondary | ICD-10-CM | POA: Diagnosis present

## 2024-09-17 DIAGNOSIS — I10 Essential (primary) hypertension: Secondary | ICD-10-CM | POA: Diagnosis present

## 2024-09-17 DIAGNOSIS — I428 Other cardiomyopathies: Secondary | ICD-10-CM | POA: Diagnosis present

## 2024-09-17 DIAGNOSIS — Z95810 Presence of automatic (implantable) cardiac defibrillator: Secondary | ICD-10-CM | POA: Insufficient documentation

## 2024-09-17 DIAGNOSIS — I472 Ventricular tachycardia, unspecified: Secondary | ICD-10-CM | POA: Insufficient documentation

## 2024-09-17 DIAGNOSIS — I2723 Pulmonary hypertension due to lung diseases and hypoxia: Secondary | ICD-10-CM

## 2024-09-17 DIAGNOSIS — E782 Mixed hyperlipidemia: Secondary | ICD-10-CM | POA: Diagnosis present

## 2024-09-17 DIAGNOSIS — Z006 Encounter for examination for normal comparison and control in clinical research program: Secondary | ICD-10-CM

## 2024-09-17 LAB — ECHOCARDIOGRAM COMPLETE
Calc EF: 59.4 %
S' Lateral: 3.1 cm
Single Plane A2C EF: 57.2 %
Single Plane A4C EF: 61.1 %

## 2024-09-17 NOTE — Progress Notes (Signed)
 " Cardiology Office Note:  .   Date:  09/17/2024  ID:  [name], DOB 28-Aug-1950, MRN [id number] PCP: Leonce Carola [name]  [facility] Providers Cardiologist:  None Electrophysiologist:  OLE [name], MD (Inactive)    History of Present Illness: .     Discussed the use of AI scribe software for clinical note transcription with the patient, who gave verbal consent to proceed.  History of Present Illness [name] is a 74 year old female with pulmonary arterial hypertension, heart failure mildly reduced ejection fraction, nonischemic cardiomyopathy, VT status post [employer] single-chamber ICD with generator change in [place] 05/27/2024, hypertension, hyperlipidemia with statin intolerance, elevated CK, OSA, idiopathic pulmonary fibrosis, cerebral aneurysm status post clip, pulmonary embolism in 2024 on Eliquis  who presents for a follow-up visit.  Pulmonary arterial hypertension and associated symptoms - Diagnosed with mild pulmonary arterial hypertension (PVR 4.3 Woods units) on right heart catheterization performed June 18, 2024 - Started Tyvaso  (treprostinil ) on July 23, 2024 - Since starting Tyvaso , experiences increased chest discomfort, changes in breathing, and throat pain - Concerned about the impact of pulmonary hypertension on cardiac function - Following with pulmonary specialists for ongoing management  Heart failure with mildly reduced ejection fraction - Heart failure with reduced ejection fraction managed with carvedilol  3.125 mg twice daily and Jardiance  10 mg daily - Previously trialed spironolactone  but discontinued due to adverse effects - Occasional leg swelling, particularly after long drives - Does not regularly take fluid medication unless needed  Adverse effects of medications - History of elevated CK levels, which normalized after discontinuing Repatha  on April 27, 2024 - Repatha  being restarted as a trial to  monitor for recurrence of elevated CK - Experienced yeast infections and a urinary tract infection last month, both resolved with treatment - Azelastine  nasal spray used for runny nose, which she associates with starting Tyvaso   Other medical symptoms and treatments - Uses Symbicort  for respiratory symptoms - Takes pantoprazole  for reflux, prescribed by gastroenterology specialists          ROS: Remaining review of systems negative  Studies Reviewed: .        Results Labs CK: Within normal limits after discontinuation of Repatha   Diagnostic Right heart catheterization (06/18/2024): Normal intracardiac filling pressures, mild pulmonary arterial hypertension, pulmonary vascular resistance 4.3 Woods units Echocardiogram (05/2024): Left ventricular ejection fraction improved from 40-45% (2024), to 45-50% (01/2024), to 50-55% (05/2024) Risk Assessment/Calculations:             Physical Exam:   VS:  BP 124/72   Pulse 99   Ht 5' 7 (1.702 m)   Wt 205 lb (93 kg)   LMP  (LMP Unknown)   SpO2 96%   BMI 32.11 kg/m    Wt Readings from Last 3 Encounters:  09/17/24 205 lb (93 kg)  09/03/24 204 lb (92.5 kg)  06/24/24 208 lb (94.3 kg)    GEN: Well nourished, well developed in no acute distress NECK: No JVD; No carotid bruits CARDIAC: RRR, no murmurs, no rubs, no gallops RESPIRATORY: Diffuse crackles ABDOMEN: Soft, non-tender, non-distended EXTREMITIES:  No edema; No deformity   ASSESSMENT AND PLAN: .    Assessment and Plan Assessment & Plan Non-ischemic cardiomyopathy with reduced ejection fraction, status post ICD placement Heart function improved with ejection fraction now 50-55%, previously 40 to 45% in 2024.  Did not tolerate spironolactone  and has an ACE inhibitor allergy limiting further GDMT titration - Continue carvedilol  3.125 mg twice daily. -  Continue Jardiance  10 mg daily for now but patient may consider discontinuing as she has a history of UTIs and yeast  infections. - Await echocardiogram results to assess current heart function.  Pulmonary arterial hypertension and IPF Diagnosed with mild pulmonary arterial hypertension. On Tyvaso  with reported chest discomfort and throat pain. - Await echocardiogram results which was performed today - Further management per pulmonology - Recommended discussion of symptoms with Dr. Geronimo before next appointment in March.  Hyperlipidemia with statin intolerance and elevated creatine kinase CK was noted to have increased around the same time that she had started Repatha .  Due to this Repatha  was discontinued and repeat CK was ordered which normalized.  She is currently undergoing rechallenge of Repatha  as this medication is not known to cause elevated CK levels and following with clinical pharmacy - If CK levels increase after two injections, pharmacy is to switch to ezetimibe  and bempedoic acid.  Agree with management  Hypertension Managed with current medications. - Continue current antihypertensive regimen with carvedilol  3.125 milligrams twice daily  History of ventricular tachycardia s/p [employer] single-chamber ICD s/p generator change in [place] 05/27/2024  ICD in place for management and prevention of cardiac arrest.  Remote interrogation 09/10/2024:  Normal device function.  Presenting rhythm: VS, PVC's. 1 NSVT, HR 153, 10 beats in duration  - Continue monitoring with ICD.  History of PE on chronic Eliquis , managed by pulmonology            Follow up: 6 months  Signed, Emeline FORBES Calender, DO  09/17/2024 4:09 PM    [facility] "

## 2024-09-17 NOTE — Patient Instructions (Signed)
 Medication Instructions:  NO CHANGES  *If you need a refill on your cardiac medications before your next appointment, please call your pharmacy*   Follow-Up: At [facility], you and your health needs are our priority.  As part of our continuing mission to provide you with exceptional heart care, our providers are all part of one team.  This team includes your primary Cardiologist (physician) and Advanced Practice Providers or APPs (Physician Assistants and Nurse Practitioners) who all work together to provide you with the care you need, when you need it.  Your next appointment:    6 months with Dr. Kriste  We recommend signing up for the patient portal called MyChart.  Sign up information is provided on this After Visit Summary.  MyChart is used to connect with patients for Virtual Visits (Telemedicine).  Patients are able to view lab/test results, encounter notes, upcoming appointments, etc.  Non-urgent messages can be sent to your provider as well.   To learn more about what you can do with MyChart, go to [email or web address].

## 2024-09-20 ENCOUNTER — Ambulatory Visit: Payer: Self-pay | Admitting: Student in an Organized Health Care Education/Training Program

## 2024-09-21 ENCOUNTER — Encounter: Payer: Self-pay | Admitting: Internal Medicine

## 2024-09-23 ENCOUNTER — Other Ambulatory Visit: Payer: Self-pay

## 2024-09-23 ENCOUNTER — Encounter: Payer: Self-pay | Admitting: Neurology

## 2024-09-23 ENCOUNTER — Ambulatory Visit (INDEPENDENT_AMBULATORY_CARE_PROVIDER_SITE_OTHER): Admitting: Neurology

## 2024-09-23 VITALS — BP 108/62 | HR 97 | Resp 15

## 2024-09-23 DIAGNOSIS — G894 Chronic pain syndrome: Secondary | ICD-10-CM | POA: Diagnosis not present

## 2024-09-23 DIAGNOSIS — G40209 Localization-related (focal) (partial) symptomatic epilepsy and epileptic syndromes with complex partial seizures, not intractable, without status epilepticus: Secondary | ICD-10-CM

## 2024-09-23 NOTE — Telephone Encounter (Signed)
 Please advise.
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
## 2024-09-23 NOTE — Patient Instructions (Signed)
 Great to see you today! Continue Onfi  for seizure prevention Call for seizure spells Continue follow-up with orthopedics and pain management for back pain Follow-up in 1 year.  Thanks!!

## 2024-10-02 ENCOUNTER — Ambulatory Visit: Admitting: Pulmonary Disease

## 2024-10-30 ENCOUNTER — Ambulatory Visit: Admitting: Neurology

## 2024-11-13 ENCOUNTER — Ambulatory Visit: Admitting: Internal Medicine

## 2024-11-13 ENCOUNTER — Encounter: Admitting: Physical Medicine and Rehabilitation

## 2024-12-02 ENCOUNTER — Ambulatory Visit: Admitting: Rheumatology

## 2024-12-04 ENCOUNTER — Ambulatory Visit: Admitting: Neurology

## 2024-12-04 ENCOUNTER — Ambulatory Visit: Admitting: Podiatry

## 2024-12-09 ENCOUNTER — Ambulatory Visit

## 2024-12-10 ENCOUNTER — Encounter

## 2025-03-10 ENCOUNTER — Ambulatory Visit

## 2025-06-09 ENCOUNTER — Ambulatory Visit

## 2025-09-08 ENCOUNTER — Ambulatory Visit

## 2025-09-23 ENCOUNTER — Ambulatory Visit: Admitting: Neurology
